# Patient Record
Sex: Female | Born: 1945
Health system: Southern US, Community
[De-identification: ages and names within clinical notes are randomized; demographics above are authoritative.]

## PROBLEM LIST (undated history)

## (undated) DIAGNOSIS — Z860100 Personal history of colon polyps, unspecified: Secondary | ICD-10-CM

## (undated) DIAGNOSIS — J45909 Unspecified asthma, uncomplicated: Secondary | ICD-10-CM

## (undated) DIAGNOSIS — Z8041 Family history of malignant neoplasm of ovary: Secondary | ICD-10-CM

## (undated) DIAGNOSIS — R0600 Dyspnea, unspecified: Secondary | ICD-10-CM

## (undated) DIAGNOSIS — Z853 Personal history of malignant neoplasm of breast: Secondary | ICD-10-CM

## (undated) DIAGNOSIS — R252 Cramp and spasm: Secondary | ICD-10-CM

## (undated) DIAGNOSIS — E785 Hyperlipidemia, unspecified: Secondary | ICD-10-CM

## (undated) DIAGNOSIS — D649 Anemia, unspecified: Secondary | ICD-10-CM

## (undated) DIAGNOSIS — Z974 Presence of external hearing-aid: Secondary | ICD-10-CM

## (undated) DIAGNOSIS — G4730 Sleep apnea, unspecified: Secondary | ICD-10-CM

## (undated) DIAGNOSIS — J449 Chronic obstructive pulmonary disease, unspecified: Secondary | ICD-10-CM

## (undated) DIAGNOSIS — K449 Diaphragmatic hernia without obstruction or gangrene: Secondary | ICD-10-CM

## (undated) DIAGNOSIS — Z973 Presence of spectacles and contact lenses: Secondary | ICD-10-CM

## (undated) DIAGNOSIS — Z87898 Personal history of other specified conditions: Secondary | ICD-10-CM

## (undated) DIAGNOSIS — Z9981 Dependence on supplemental oxygen: Secondary | ICD-10-CM

## (undated) DIAGNOSIS — R351 Nocturia: Secondary | ICD-10-CM

## (undated) DIAGNOSIS — Z8719 Personal history of other diseases of the digestive system: Secondary | ICD-10-CM

## (undated) DIAGNOSIS — I82501 Chronic embolism and thrombosis of unspecified deep veins of right lower extremity: Secondary | ICD-10-CM

## (undated) DIAGNOSIS — I739 Peripheral vascular disease, unspecified: Secondary | ICD-10-CM

## (undated) DIAGNOSIS — I509 Heart failure, unspecified: Secondary | ICD-10-CM

## (undated) DIAGNOSIS — I2510 Atherosclerotic heart disease of native coronary artery without angina pectoris: Secondary | ICD-10-CM

## (undated) DIAGNOSIS — J439 Emphysema, unspecified: Secondary | ICD-10-CM

## (undated) DIAGNOSIS — M1990 Unspecified osteoarthritis, unspecified site: Secondary | ICD-10-CM

## (undated) DIAGNOSIS — R911 Solitary pulmonary nodule: Secondary | ICD-10-CM

## (undated) DIAGNOSIS — E039 Hypothyroidism, unspecified: Secondary | ICD-10-CM

## (undated) DIAGNOSIS — I779 Disorder of arteries and arterioles, unspecified: Secondary | ICD-10-CM

## (undated) DIAGNOSIS — Z808 Family history of malignant neoplasm of other organs or systems: Secondary | ICD-10-CM

## (undated) DIAGNOSIS — I10 Essential (primary) hypertension: Secondary | ICD-10-CM

## (undated) DIAGNOSIS — Z803 Family history of malignant neoplasm of breast: Secondary | ICD-10-CM

## (undated) DIAGNOSIS — K551 Chronic vascular disorders of intestine: Secondary | ICD-10-CM

## (undated) DIAGNOSIS — R0609 Other forms of dyspnea: Secondary | ICD-10-CM

## (undated) DIAGNOSIS — Z800 Family history of malignant neoplasm of digestive organs: Secondary | ICD-10-CM

## (undated) DIAGNOSIS — K219 Gastro-esophageal reflux disease without esophagitis: Secondary | ICD-10-CM

## (undated) DIAGNOSIS — M797 Fibromyalgia: Secondary | ICD-10-CM

## (undated) DIAGNOSIS — D494 Neoplasm of unspecified behavior of bladder: Secondary | ICD-10-CM

## (undated) DIAGNOSIS — J441 Chronic obstructive pulmonary disease with (acute) exacerbation: Secondary | ICD-10-CM

## (undated) DIAGNOSIS — N189 Chronic kidney disease, unspecified: Secondary | ICD-10-CM

## (undated) DIAGNOSIS — J189 Pneumonia, unspecified organism: Secondary | ICD-10-CM

## (undated) DIAGNOSIS — I83899 Varicose veins of unspecified lower extremities with other complications: Secondary | ICD-10-CM

## (undated) HISTORY — DX: Varicose veins of unspecified lower extremity with other complications: I83.899

## (undated) HISTORY — PX: VIDEO ASSISTED THORACOSCOPY (VATS)/WEDGE RESECTION: SHX6174

## (undated) HISTORY — DX: Family history of malignant neoplasm of digestive organs: Z80.0

## (undated) HISTORY — DX: Gastro-esophageal reflux disease without esophagitis: K21.9

## (undated) HISTORY — DX: Family history of malignant neoplasm of other organs or systems: Z80.8

## (undated) HISTORY — PX: RECONSTRUCTION BREAST W/ LATISSIMUS DORSI FLAP: SUR1078

## (undated) HISTORY — DX: Personal history of colonic polyps: Z86.010

## (undated) HISTORY — DX: Personal history of colon polyps, unspecified: Z86.0100

## (undated) HISTORY — PX: MASTECTOMY: SHX3

## (undated) HISTORY — DX: Peripheral vascular disease, unspecified: I73.9

## (undated) HISTORY — DX: Personal history of malignant neoplasm of breast: Z85.3

## (undated) HISTORY — DX: Hyperlipidemia, unspecified: E78.5

## (undated) HISTORY — DX: Essential (primary) hypertension: I10

## (undated) HISTORY — DX: Hypothyroidism, unspecified: E03.9

## (undated) HISTORY — PX: BREAST LUMPECTOMY W/ NEEDLE LOCALIZATION: SHX1266

## (undated) HISTORY — PX: REDUCTION MAMMAPLASTY: SUR839

## (undated) HISTORY — PX: CARDIAC CATHETERIZATION: SHX172

## (undated) HISTORY — PX: SIMPLE MASTECTOMY: SHX2409

## (undated) HISTORY — PX: TRANSTHORACIC ECHOCARDIOGRAM: SHX275

## (undated) HISTORY — DX: Family history of malignant neoplasm of breast: Z80.3

## (undated) HISTORY — DX: Family history of malignant neoplasm of ovary: Z80.41

## (undated) HISTORY — DX: Fibromyalgia: M79.7

---

## 1971-10-16 HISTORY — PX: VAGINAL HYSTERECTOMY: SUR661

## 1993-10-15 DIAGNOSIS — M797 Fibromyalgia: Secondary | ICD-10-CM

## 1993-10-15 HISTORY — DX: Fibromyalgia: M79.7

## 1998-12-20 ENCOUNTER — Encounter: Payer: Self-pay | Admitting: Gastroenterology

## 1998-12-20 ENCOUNTER — Ambulatory Visit (HOSPITAL_COMMUNITY): Admission: RE | Admit: 1998-12-20 | Discharge: 1998-12-20 | Payer: Self-pay | Admitting: Gastroenterology

## 1999-08-08 ENCOUNTER — Encounter: Admission: RE | Admit: 1999-08-08 | Discharge: 1999-08-08 | Payer: Self-pay | Admitting: *Deleted

## 2000-10-15 HISTORY — PX: PARTIAL COLECTOMY: SHX5273

## 2003-07-21 ENCOUNTER — Other Ambulatory Visit: Admission: RE | Admit: 2003-07-21 | Discharge: 2003-07-21 | Payer: Self-pay | Admitting: *Deleted

## 2004-06-01 ENCOUNTER — Other Ambulatory Visit: Admission: RE | Admit: 2004-06-01 | Discharge: 2004-06-01 | Payer: Self-pay | Admitting: Family Medicine

## 2004-07-11 ENCOUNTER — Ambulatory Visit (HOSPITAL_COMMUNITY): Admission: RE | Admit: 2004-07-11 | Discharge: 2004-07-11 | Payer: Self-pay | Admitting: Gastroenterology

## 2004-09-12 ENCOUNTER — Ambulatory Visit: Payer: Self-pay | Admitting: Family Medicine

## 2004-10-15 LAB — HM COLONOSCOPY

## 2004-10-24 ENCOUNTER — Ambulatory Visit: Payer: Self-pay | Admitting: Family Medicine

## 2005-01-30 ENCOUNTER — Ambulatory Visit: Payer: Self-pay | Admitting: Family Medicine

## 2005-02-07 ENCOUNTER — Ambulatory Visit: Payer: Self-pay | Admitting: Family Medicine

## 2005-06-26 ENCOUNTER — Ambulatory Visit: Payer: Self-pay | Admitting: Family Medicine

## 2005-07-25 ENCOUNTER — Ambulatory Visit (HOSPITAL_COMMUNITY): Admission: RE | Admit: 2005-07-25 | Discharge: 2005-07-25 | Payer: Self-pay | Admitting: Gastroenterology

## 2005-07-27 ENCOUNTER — Inpatient Hospital Stay (HOSPITAL_COMMUNITY): Admission: EM | Admit: 2005-07-27 | Discharge: 2005-07-30 | Payer: Self-pay | Admitting: *Deleted

## 2005-07-27 ENCOUNTER — Encounter: Admission: RE | Admit: 2005-07-27 | Discharge: 2005-07-27 | Payer: Self-pay | Admitting: Gastroenterology

## 2005-07-30 ENCOUNTER — Ambulatory Visit: Payer: Self-pay | Admitting: Internal Medicine

## 2005-08-02 ENCOUNTER — Ambulatory Visit: Payer: Self-pay | Admitting: Oncology

## 2005-08-06 ENCOUNTER — Ambulatory Visit: Payer: Self-pay | Admitting: Cardiology

## 2005-08-21 ENCOUNTER — Ambulatory Visit (HOSPITAL_COMMUNITY): Admission: RE | Admit: 2005-08-21 | Discharge: 2005-08-21 | Payer: Self-pay | Admitting: Thoracic Surgery

## 2005-09-11 ENCOUNTER — Inpatient Hospital Stay (HOSPITAL_COMMUNITY)
Admission: RE | Admit: 2005-09-11 | Discharge: 2005-09-14 | Payer: Self-pay | Admitting: Thoracic Surgery (Cardiothoracic Vascular Surgery)

## 2005-09-11 ENCOUNTER — Encounter (INDEPENDENT_AMBULATORY_CARE_PROVIDER_SITE_OTHER): Payer: Self-pay | Admitting: *Deleted

## 2005-09-25 ENCOUNTER — Encounter
Admission: RE | Admit: 2005-09-25 | Discharge: 2005-09-25 | Payer: Self-pay | Admitting: Thoracic Surgery (Cardiothoracic Vascular Surgery)

## 2006-01-01 ENCOUNTER — Ambulatory Visit: Payer: Self-pay | Admitting: Family Medicine

## 2006-08-28 ENCOUNTER — Ambulatory Visit: Payer: Self-pay | Admitting: Family Medicine

## 2006-08-28 LAB — CONVERTED CEMR LAB
ALT: 15 units/L (ref 0–40)
AST: 21 units/L (ref 0–37)
Albumin: 3.8 g/dL (ref 3.5–5.2)
Alkaline Phosphatase: 58 units/L (ref 39–117)
BUN: 13 mg/dL (ref 6–23)
Basophils Absolute: 0.1 10*3/uL (ref 0.0–0.1)
Basophils Relative: 0.9 % (ref 0.0–1.0)
CO2: 29 meq/L (ref 19–32)
Calcium: 9.7 mg/dL (ref 8.4–10.5)
Chloride: 107 meq/L (ref 96–112)
Chol/HDL Ratio, serum: 2.9
Cholesterol: 215 mg/dL (ref 0–200)
Creatinine, Ser: 1 mg/dL (ref 0.4–1.2)
Eosinophil percent: 9.6 % — ABNORMAL HIGH (ref 0.0–5.0)
GFR calc non Af Amer: 60 mL/min
Glomerular Filtration Rate, Af Am: 72 mL/min/{1.73_m2}
Glucose, Bld: 95 mg/dL (ref 70–99)
HCT: 45.5 % (ref 36.0–46.0)
HDL: 73.7 mg/dL (ref >39.0)
Hemoglobin: 14.9 g/dL (ref 12.0–15.0)
LDL DIRECT: 126.5 mg/dL
Lymphocytes Relative: 28.7 % (ref 12.0–46.0)
MCHC: 32.8 g/dL (ref 30.0–36.0)
MCV: 96 fL (ref 78.0–100.0)
Monocytes Absolute: 0.7 10*3/uL (ref 0.2–0.7)
Monocytes Relative: 11.2 % — ABNORMAL HIGH (ref 3.0–11.0)
Neutro Abs: 3.1 10*3/uL (ref 1.4–7.7)
Neutrophils Relative %: 49.6 % (ref 43.0–77.0)
Platelets: 408 10*3/uL — ABNORMAL HIGH (ref 150–400)
Potassium: 5.2 meq/L — ABNORMAL HIGH (ref 3.5–5.1)
RBC: 4.74 M/uL (ref 3.87–5.11)
RDW: 13.1 % (ref 11.5–14.6)
Sodium: 143 meq/L (ref 135–145)
TSH: 3.13 microintl units/mL (ref 0.35–5.50)
Total Bilirubin: 1 mg/dL (ref 0.3–1.2)
Total Protein: 6.9 g/dL (ref 6.0–8.3)
Triglyceride fasting, serum: 70 mg/dL (ref 0–149)
VLDL: 14 mg/dL (ref 0–40)
WBC: 6.3 10*3/uL (ref 4.5–10.5)

## 2006-09-03 ENCOUNTER — Encounter: Payer: Self-pay | Admitting: Family Medicine

## 2006-09-03 ENCOUNTER — Other Ambulatory Visit: Admission: RE | Admit: 2006-09-03 | Discharge: 2006-09-03 | Payer: Self-pay | Admitting: Family Medicine

## 2006-09-03 ENCOUNTER — Ambulatory Visit: Payer: Self-pay | Admitting: Family Medicine

## 2006-09-11 ENCOUNTER — Encounter: Payer: Self-pay | Admitting: Family Medicine

## 2006-09-11 LAB — CONVERTED CEMR LAB: Pap Smear: NEGATIVE

## 2007-08-19 ENCOUNTER — Telehealth: Payer: Self-pay | Admitting: Family Medicine

## 2007-09-02 ENCOUNTER — Ambulatory Visit: Payer: Self-pay | Admitting: Family Medicine

## 2007-09-02 LAB — CONVERTED CEMR LAB
Bilirubin Urine: NEGATIVE
Glucose, Urine, Semiquant: NEGATIVE
Ketones, urine, test strip: NEGATIVE
Nitrite: NEGATIVE
Protein, U semiquant: NEGATIVE
Specific Gravity, Urine: <1.005
Urobilinogen, UA: 0.2
WBC Urine, dipstick: NEGATIVE
pH: 5.5

## 2007-09-08 ENCOUNTER — Encounter: Payer: Self-pay | Admitting: Family Medicine

## 2007-09-08 DIAGNOSIS — Z8719 Personal history of other diseases of the digestive system: Secondary | ICD-10-CM | POA: Insufficient documentation

## 2007-09-08 DIAGNOSIS — Z860100 Personal history of colon polyps, unspecified: Secondary | ICD-10-CM | POA: Insufficient documentation

## 2007-09-08 DIAGNOSIS — K219 Gastro-esophageal reflux disease without esophagitis: Secondary | ICD-10-CM | POA: Insufficient documentation

## 2007-09-08 DIAGNOSIS — J441 Chronic obstructive pulmonary disease with (acute) exacerbation: Secondary | ICD-10-CM | POA: Insufficient documentation

## 2007-09-08 DIAGNOSIS — I10 Essential (primary) hypertension: Secondary | ICD-10-CM | POA: Insufficient documentation

## 2007-09-09 ENCOUNTER — Other Ambulatory Visit: Admission: RE | Admit: 2007-09-09 | Discharge: 2007-09-09 | Payer: Self-pay | Admitting: Family Medicine

## 2007-09-09 ENCOUNTER — Ambulatory Visit: Payer: Self-pay | Admitting: Family Medicine

## 2007-09-09 ENCOUNTER — Encounter: Payer: Self-pay | Admitting: Family Medicine

## 2007-09-09 LAB — CONVERTED CEMR LAB: Pap Smear: NORMAL

## 2007-10-01 ENCOUNTER — Encounter: Payer: Self-pay | Admitting: Family Medicine

## 2007-10-06 LAB — CONVERTED CEMR LAB
ALT: 16 units/L (ref 0–35)
AST: 20 units/L (ref 0–37)
Albumin: 4.2 g/dL (ref 3.5–5.2)
Alkaline Phosphatase: 56 units/L (ref 39–117)
BUN: 15 mg/dL (ref 6–23)
Basophils Absolute: 0 10*3/uL (ref 0.0–0.1)
Basophils Relative: 0 % (ref 0.0–1.0)
Bilirubin, Direct: 0.1 mg/dL (ref 0.0–0.3)
CO2: 26 meq/L (ref 19–32)
Calcium: 9.9 mg/dL (ref 8.4–10.5)
Chloride: 101 meq/L (ref 96–112)
Cholesterol: 222 mg/dL (ref 0–200)
Creatinine, Ser: 0.8 mg/dL (ref 0.4–1.2)
Direct LDL: 108 mg/dL
Eosinophils Absolute: 0.5 10*3/uL (ref 0.0–0.6)
Eosinophils Relative: 7.1 % — ABNORMAL HIGH (ref 0.0–5.0)
GFR calc Af Amer: 93 mL/min
GFR calc non Af Amer: 77 mL/min
Glucose, Bld: 87 mg/dL (ref 70–99)
HCT: 45 % (ref 36.0–46.0)
HDL: 103.8 mg/dL (ref >39.0)
Hemoglobin: 15.5 g/dL — ABNORMAL HIGH (ref 12.0–15.0)
Lymphocytes Relative: 30.9 % (ref 12.0–46.0)
MCHC: 34.4 g/dL (ref 30.0–36.0)
MCV: 94.7 fL (ref 78.0–100.0)
Monocytes Absolute: 0.9 10*3/uL — ABNORMAL HIGH (ref 0.2–0.7)
Monocytes Relative: 12 % — ABNORMAL HIGH (ref 3.0–11.0)
Neutro Abs: 3.9 10*3/uL (ref 1.4–7.7)
Neutrophils Relative %: 50 % (ref 43.0–77.0)
Platelets: 403 10*3/uL — ABNORMAL HIGH (ref 150–400)
Potassium: 5.5 meq/L — ABNORMAL HIGH (ref 3.5–5.1)
RBC: 4.75 M/uL (ref 3.87–5.11)
RDW: 12.6 % (ref 11.5–14.6)
Sodium: 136 meq/L (ref 135–145)
TSH: 4.4 microintl units/mL (ref 0.35–5.50)
Total Bilirubin: 0.7 mg/dL (ref 0.3–1.2)
Total CHOL/HDL Ratio: 2.1
Total Protein: 7.2 g/dL (ref 6.0–8.3)
Triglycerides: 56 mg/dL (ref 0–149)
VLDL: 11 mg/dL (ref 0–40)
WBC: 7.7 10*3/uL (ref 4.5–10.5)

## 2007-11-18 ENCOUNTER — Ambulatory Visit: Payer: Self-pay | Admitting: Family Medicine

## 2007-11-18 DIAGNOSIS — IMO0001 Reserved for inherently not codable concepts without codable children: Secondary | ICD-10-CM | POA: Insufficient documentation

## 2008-05-24 ENCOUNTER — Telehealth: Payer: Self-pay | Admitting: Family Medicine

## 2008-05-25 ENCOUNTER — Ambulatory Visit: Payer: Self-pay | Admitting: Family Medicine

## 2008-06-23 ENCOUNTER — Ambulatory Visit: Payer: Self-pay | Admitting: Family Medicine

## 2008-09-07 ENCOUNTER — Ambulatory Visit: Payer: Self-pay | Admitting: Family Medicine

## 2008-09-07 LAB — CONVERTED CEMR LAB
Bilirubin Urine: NEGATIVE
Blood in Urine, dipstick: NEGATIVE
Glucose, Urine, Semiquant: NEGATIVE
Ketones, urine, test strip: NEGATIVE
Nitrite: NEGATIVE
Protein, U semiquant: NEGATIVE
Specific Gravity, Urine: 1.01
Urobilinogen, UA: 0.2
WBC Urine, dipstick: NEGATIVE
pH: 5.5

## 2008-09-14 ENCOUNTER — Ambulatory Visit: Payer: Self-pay | Admitting: Family Medicine

## 2008-09-14 DIAGNOSIS — H9190 Unspecified hearing loss, unspecified ear: Secondary | ICD-10-CM | POA: Insufficient documentation

## 2008-09-14 LAB — CONVERTED CEMR LAB
ALT: 20 units/L (ref 0–35)
AST: 24 units/L (ref 0–37)
Albumin: 3.9 g/dL (ref 3.5–5.2)
Alkaline Phosphatase: 51 units/L (ref 39–117)
BUN: 13 mg/dL (ref 6–23)
Basophils Absolute: 0.1 10*3/uL (ref 0.0–0.1)
Basophils Relative: 1.1 % (ref 0.0–3.0)
Bilirubin, Direct: 0.1 mg/dL (ref 0.0–0.3)
CO2: 28 meq/L (ref 19–32)
Calcium: 9.3 mg/dL (ref 8.4–10.5)
Chloride: 104 meq/L (ref 96–112)
Cholesterol: 225 mg/dL (ref 0–200)
Creatinine, Ser: 0.9 mg/dL (ref 0.4–1.2)
Direct LDL: 101.4 mg/dL
Eosinophils Absolute: 0.4 10*3/uL (ref 0.0–0.7)
Eosinophils Relative: 6.7 % — ABNORMAL HIGH (ref 0.0–5.0)
GFR calc Af Amer: 81 mL/min
GFR calc non Af Amer: 67 mL/min
Glucose, Bld: 75 mg/dL (ref 70–99)
HCT: 41.9 % (ref 36.0–46.0)
HDL: 89.5 mg/dL (ref >39.0)
Hemoglobin: 14.7 g/dL (ref 12.0–15.0)
Lymphocytes Relative: 31.6 % (ref 12.0–46.0)
MCHC: 35.2 g/dL (ref 30.0–36.0)
MCV: 95.4 fL (ref 78.0–100.0)
Monocytes Absolute: 0.7 10*3/uL (ref 0.1–1.0)
Monocytes Relative: 10.9 % (ref 3.0–12.0)
Neutro Abs: 3.1 10*3/uL (ref 1.4–7.7)
Neutrophils Relative %: 49.7 % (ref 43.0–77.0)
Platelets: 370 10*3/uL (ref 150–400)
Potassium: 4.7 meq/L (ref 3.5–5.1)
RBC: 4.39 M/uL (ref 3.87–5.11)
RDW: 13.2 % (ref 11.5–14.6)
Sodium: 139 meq/L (ref 135–145)
TSH: 5.13 microintl units/mL (ref 0.35–5.50)
Total Bilirubin: 0.6 mg/dL (ref 0.3–1.2)
Total CHOL/HDL Ratio: 2.5
Total Protein: 7.2 g/dL (ref 6.0–8.3)
Triglycerides: 98 mg/dL (ref 0–149)
VLDL: 20 mg/dL (ref 0–40)
WBC: 6.3 10*3/uL (ref 4.5–10.5)

## 2008-10-13 ENCOUNTER — Encounter: Payer: Self-pay | Admitting: Family Medicine

## 2008-10-15 DIAGNOSIS — C801 Malignant (primary) neoplasm, unspecified: Secondary | ICD-10-CM

## 2008-10-15 HISTORY — PX: BREAST EXCISIONAL BIOPSY: SUR124

## 2008-10-15 HISTORY — DX: Malignant (primary) neoplasm, unspecified: C80.1

## 2008-10-18 ENCOUNTER — Encounter: Payer: Self-pay | Admitting: Family Medicine

## 2008-10-19 ENCOUNTER — Encounter: Payer: Self-pay | Admitting: Family Medicine

## 2008-10-21 ENCOUNTER — Telehealth: Payer: Self-pay | Admitting: Family Medicine

## 2008-10-25 ENCOUNTER — Encounter: Admission: RE | Admit: 2008-10-25 | Discharge: 2008-10-25 | Payer: Self-pay | Admitting: Radiology

## 2008-10-29 ENCOUNTER — Encounter: Payer: Self-pay | Admitting: Family Medicine

## 2008-11-08 ENCOUNTER — Encounter: Admission: RE | Admit: 2008-11-08 | Discharge: 2008-11-08 | Payer: Self-pay | Admitting: Surgery

## 2008-11-09 ENCOUNTER — Encounter (INDEPENDENT_AMBULATORY_CARE_PROVIDER_SITE_OTHER): Payer: Self-pay | Admitting: Surgery

## 2008-11-09 ENCOUNTER — Ambulatory Visit (HOSPITAL_BASED_OUTPATIENT_CLINIC_OR_DEPARTMENT_OTHER): Admission: RE | Admit: 2008-11-09 | Discharge: 2008-11-09 | Payer: Self-pay | Admitting: Surgery

## 2008-11-10 ENCOUNTER — Ambulatory Visit: Payer: Self-pay | Admitting: Oncology

## 2008-11-24 ENCOUNTER — Encounter: Payer: Self-pay | Admitting: Family Medicine

## 2008-12-20 ENCOUNTER — Ambulatory Visit: Payer: Self-pay | Admitting: Oncology

## 2008-12-22 ENCOUNTER — Encounter: Payer: Self-pay | Admitting: Family Medicine

## 2008-12-23 ENCOUNTER — Encounter: Admission: RE | Admit: 2008-12-23 | Discharge: 2008-12-23 | Payer: Self-pay | Admitting: Oncology

## 2008-12-23 ENCOUNTER — Ambulatory Visit (HOSPITAL_COMMUNITY): Admission: RE | Admit: 2008-12-23 | Discharge: 2008-12-23 | Payer: Self-pay | Admitting: Oncology

## 2008-12-25 ENCOUNTER — Ambulatory Visit (HOSPITAL_COMMUNITY): Admission: RE | Admit: 2008-12-25 | Discharge: 2008-12-25 | Payer: Self-pay | Admitting: Oncology

## 2008-12-28 ENCOUNTER — Encounter (INDEPENDENT_AMBULATORY_CARE_PROVIDER_SITE_OTHER): Payer: Self-pay | Admitting: Surgery

## 2008-12-28 ENCOUNTER — Ambulatory Visit (HOSPITAL_COMMUNITY): Admission: RE | Admit: 2008-12-28 | Discharge: 2008-12-29 | Payer: Self-pay | Admitting: Surgery

## 2008-12-29 ENCOUNTER — Telehealth: Payer: Self-pay | Admitting: Family Medicine

## 2009-01-03 ENCOUNTER — Encounter: Payer: Self-pay | Admitting: Family Medicine

## 2009-01-21 ENCOUNTER — Encounter: Payer: Self-pay | Admitting: Family Medicine

## 2009-01-21 LAB — CBC WITH DIFFERENTIAL/PLATELET
BASO%: 0.4 % (ref 0.0–2.0)
Basophils Absolute: 0 10*3/uL (ref 0.0–0.1)
EOS%: 7.4 % — ABNORMAL HIGH (ref 0.0–7.0)
Eosinophils Absolute: 0.4 10*3/uL (ref 0.0–0.5)
HCT: 41.1 % (ref 34.8–46.6)
HGB: 14 g/dL (ref 11.6–15.9)
LYMPH%: 26.1 % (ref 14.0–49.7)
MCH: 32 pg (ref 25.1–34.0)
MCHC: 34 g/dL (ref 31.5–36.0)
MCV: 94 fL (ref 79.5–101.0)
MONO#: 0.7 10*3/uL (ref 0.1–0.9)
MONO%: 11.4 % (ref 0.0–14.0)
NEUT#: 3.2 10*3/uL (ref 1.5–6.5)
NEUT%: 54.7 % (ref 38.4–76.8)
Platelets: 363 10*3/uL (ref 145–400)
RBC: 4.37 10*6/uL (ref 3.70–5.45)
RDW: 13.7 % (ref 11.2–14.5)
WBC: 5.9 10*3/uL (ref 3.9–10.3)
lymph#: 1.5 10*3/uL (ref 0.9–3.3)

## 2009-01-31 ENCOUNTER — Encounter: Payer: Self-pay | Admitting: Family Medicine

## 2009-02-03 ENCOUNTER — Ambulatory Visit: Payer: Self-pay | Admitting: Oncology

## 2009-02-07 ENCOUNTER — Encounter: Payer: Self-pay | Admitting: Family Medicine

## 2009-02-07 LAB — CBC WITH DIFFERENTIAL/PLATELET
BASO%: 1.2 % (ref 0.0–2.0)
Basophils Absolute: 0.1 10e3/uL (ref 0.0–0.1)
EOS%: 4.1 % (ref 0.0–7.0)
Eosinophils Absolute: 0.3 10e3/uL (ref 0.0–0.5)
HCT: 42.7 % (ref 34.8–46.6)
HGB: 14.8 g/dL (ref 11.6–15.9)
LYMPH%: 23.1 % (ref 14.0–49.7)
MCH: 32 pg (ref 25.1–34.0)
MCHC: 34.7 g/dL (ref 31.5–36.0)
MCV: 92.3 fL (ref 79.5–101.0)
MONO#: 1 10e3/uL — ABNORMAL HIGH (ref 0.1–0.9)
MONO%: 12.6 % (ref 0.0–14.0)
NEUT#: 4.7 10e3/uL (ref 1.5–6.5)
NEUT%: 59 % (ref 38.4–76.8)
Platelets: 514 10e3/uL — ABNORMAL HIGH (ref 145–400)
RBC: 4.63 10e6/uL (ref 3.70–5.45)
RDW: 13.6 % (ref 11.2–14.5)
WBC: 7.9 10e3/uL (ref 3.9–10.3)
lymph#: 1.8 10e3/uL (ref 0.9–3.3)

## 2009-02-15 ENCOUNTER — Encounter: Payer: Self-pay | Admitting: Family Medicine

## 2009-02-22 ENCOUNTER — Encounter: Payer: Self-pay | Admitting: Family Medicine

## 2009-02-22 LAB — CBC WITH DIFFERENTIAL/PLATELET
BASO%: 0.2 % (ref 0.0–2.0)
Basophils Absolute: 0.1 10*3/uL (ref 0.0–0.1)
EOS%: 0.3 % (ref 0.0–7.0)
Eosinophils Absolute: 0.1 10*3/uL (ref 0.0–0.5)
HCT: 38.7 % (ref 34.8–46.6)
HGB: 13 g/dL (ref 11.6–15.9)
LYMPH%: 4.9 % — ABNORMAL LOW (ref 14.0–49.7)
MCH: 31.4 pg (ref 25.1–34.0)
MCHC: 33.7 g/dL (ref 31.5–36.0)
MCV: 93.1 fL (ref 79.5–101.0)
MONO#: 0.3 10*3/uL (ref 0.1–0.9)
MONO%: 0.9 % (ref 0.0–14.0)
NEUT#: 34.1 10*3/uL — ABNORMAL HIGH (ref 1.5–6.5)
NEUT%: 93.7 % — ABNORMAL HIGH (ref 38.4–76.8)
Platelets: 341 10*3/uL (ref 145–400)
RBC: 4.15 10*6/uL (ref 3.70–5.45)
RDW: 13.1 % (ref 11.2–14.5)
WBC: 36.4 10*3/uL — ABNORMAL HIGH (ref 3.9–10.3)
lymph#: 1.8 10*3/uL (ref 0.9–3.3)

## 2009-03-02 ENCOUNTER — Telehealth: Payer: Self-pay | Admitting: Family Medicine

## 2009-03-08 ENCOUNTER — Encounter: Payer: Self-pay | Admitting: Family Medicine

## 2009-03-08 LAB — CBC WITH DIFFERENTIAL/PLATELET
BASO%: 0.4 % (ref 0.0–2.0)
Basophils Absolute: 0 10*3/uL (ref 0.0–0.1)
EOS%: 0 % (ref 0.0–7.0)
Eosinophils Absolute: 0 10*3/uL (ref 0.0–0.5)
HCT: 37 % (ref 34.8–46.6)
HGB: 12.7 g/dL (ref 11.6–15.9)
LYMPH%: 14.6 % (ref 14.0–49.7)
MCH: 30.4 pg (ref 25.1–34.0)
MCHC: 34.3 g/dL (ref 31.5–36.0)
MCV: 88.5 fL (ref 79.5–101.0)
MONO#: 0 10*3/uL — ABNORMAL LOW (ref 0.1–0.9)
MONO%: 0.4 % (ref 0.0–14.0)
NEUT#: 3.9 10*3/uL (ref 1.5–6.5)
NEUT%: 84.6 % — ABNORMAL HIGH (ref 38.4–76.8)
Platelets: 809 10*3/uL — ABNORMAL HIGH (ref 145–400)
RBC: 4.18 10*6/uL (ref 3.70–5.45)
RDW: 13.3 % (ref 11.2–14.5)
WBC: 4.6 10*3/uL (ref 3.9–10.3)
lymph#: 0.7 10*3/uL — ABNORMAL LOW (ref 0.9–3.3)

## 2009-03-08 LAB — URINALYSIS, MICROSCOPIC - CHCC
Bilirubin (Urine): NEGATIVE
Blood: NEGATIVE
Glucose: NEGATIVE g/dL
Ketones: NEGATIVE mg/dL
Leukocyte Esterase: NEGATIVE
Nitrite: NEGATIVE
Protein: NEGATIVE mg/dL
Specific Gravity, Urine: 1.03 (ref 1.003–1.035)
WBC, UA: NEGATIVE (ref 0–2)
pH: 5 (ref 4.6–8.0)

## 2009-03-09 LAB — URINE CULTURE

## 2009-03-15 ENCOUNTER — Encounter: Payer: Self-pay | Admitting: Family Medicine

## 2009-03-15 LAB — CBC WITH DIFFERENTIAL/PLATELET
BASO%: 0.6 % (ref 0.0–2.0)
Basophils Absolute: 0 10*3/uL (ref 0.0–0.1)
EOS%: 7.7 % — ABNORMAL HIGH (ref 0.0–7.0)
Eosinophils Absolute: 0.6 10*3/uL — ABNORMAL HIGH (ref 0.0–0.5)
HCT: 39.7 % (ref 34.8–46.6)
HGB: 13.6 g/dL (ref 11.6–15.9)
LYMPH%: 23.6 % (ref 14.0–49.7)
MCH: 31.4 pg (ref 25.1–34.0)
MCHC: 34.2 g/dL (ref 31.5–36.0)
MCV: 91.7 fL (ref 79.5–101.0)
MONO#: 0.9 10*3/uL (ref 0.1–0.9)
MONO%: 12.3 % (ref 0.0–14.0)
NEUT#: 4.2 10*3/uL (ref 1.5–6.5)
NEUT%: 55.8 % (ref 38.4–76.8)
Platelets: 660 10*3/uL — ABNORMAL HIGH (ref 145–400)
RBC: 4.33 10*6/uL (ref 3.70–5.45)
RDW: 13.9 % (ref 11.2–14.5)
WBC: 7.5 10*3/uL (ref 3.9–10.3)
lymph#: 1.8 10*3/uL (ref 0.9–3.3)

## 2009-03-15 LAB — COMPREHENSIVE METABOLIC PANEL
ALT: 19 U/L (ref 0–35)
AST: 17 U/L (ref 0–37)
Albumin: 4.1 g/dL (ref 3.5–5.2)
Alkaline Phosphatase: 86 U/L (ref 39–117)
BUN: 13 mg/dL (ref 6–23)
CO2: 23 mEq/L (ref 19–32)
Calcium: 9.7 mg/dL (ref 8.4–10.5)
Chloride: 103 mEq/L (ref 96–112)
Creatinine, Ser: 0.87 mg/dL (ref 0.40–1.20)
Glucose, Bld: 92 mg/dL (ref 70–99)
Potassium: 5.4 mEq/L — ABNORMAL HIGH (ref 3.5–5.3)
Sodium: 138 mEq/L (ref 135–145)
Total Bilirubin: 0.4 mg/dL (ref 0.3–1.2)
Total Protein: 7.3 g/dL (ref 6.0–8.3)

## 2009-03-18 ENCOUNTER — Ambulatory Visit: Payer: Self-pay | Admitting: Oncology

## 2009-03-24 ENCOUNTER — Encounter: Payer: Self-pay | Admitting: Family Medicine

## 2009-03-24 LAB — CBC WITH DIFFERENTIAL/PLATELET
BASO%: 0.1 % (ref 0.0–2.0)
Basophils Absolute: 0 10*3/uL (ref 0.0–0.1)
EOS%: 1.4 % (ref 0.0–7.0)
Eosinophils Absolute: 0.2 10*3/uL (ref 0.0–0.5)
HCT: 36.8 % (ref 34.8–46.6)
HGB: 12.9 g/dL (ref 11.6–15.9)
LYMPH%: 9.3 % — ABNORMAL LOW (ref 14.0–49.7)
MCH: 31.9 pg (ref 25.1–34.0)
MCHC: 35 g/dL (ref 31.5–36.0)
MCV: 91.1 fL (ref 79.5–101.0)
MONO#: 0.7 10*3/uL (ref 0.1–0.9)
MONO%: 4.2 % (ref 0.0–14.0)
NEUT#: 13.7 10*3/uL — ABNORMAL HIGH (ref 1.5–6.5)
NEUT%: 85 % — ABNORMAL HIGH (ref 38.4–76.8)
Platelets: 322 10*3/uL (ref 145–400)
RBC: 4.04 10*6/uL (ref 3.70–5.45)
RDW: 13.9 % (ref 11.2–14.5)
WBC: 16.1 10*3/uL — ABNORMAL HIGH (ref 3.9–10.3)
lymph#: 1.5 10*3/uL (ref 0.9–3.3)

## 2009-04-11 ENCOUNTER — Encounter: Payer: Self-pay | Admitting: Family Medicine

## 2009-04-11 LAB — CBC WITH DIFFERENTIAL/PLATELET
BASO%: 0.2 % (ref 0.0–2.0)
Basophils Absolute: 0 10*3/uL (ref 0.0–0.1)
EOS%: 0 % (ref 0.0–7.0)
Eosinophils Absolute: 0 10*3/uL (ref 0.0–0.5)
HCT: 36.4 % (ref 34.8–46.6)
HGB: 12.4 g/dL (ref 11.6–15.9)
LYMPH%: 12 % — ABNORMAL LOW (ref 14.0–49.7)
MCH: 30 pg (ref 25.1–34.0)
MCHC: 34.1 g/dL (ref 31.5–36.0)
MCV: 87.9 fL (ref 79.5–101.0)
MONO#: 0 10*3/uL — ABNORMAL LOW (ref 0.1–0.9)
MONO%: 0.4 % (ref 0.0–14.0)
NEUT#: 4.4 10*3/uL (ref 1.5–6.5)
NEUT%: 87.4 % — ABNORMAL HIGH (ref 38.4–76.8)
Platelets: 667 10*3/uL — ABNORMAL HIGH (ref 145–400)
RBC: 4.14 10*6/uL (ref 3.70–5.45)
RDW: 14.6 % — ABNORMAL HIGH (ref 11.2–14.5)
WBC: 5 10*3/uL (ref 3.9–10.3)
lymph#: 0.6 10*3/uL — ABNORMAL LOW (ref 0.9–3.3)

## 2009-04-11 LAB — COMPREHENSIVE METABOLIC PANEL
ALT: 15 U/L (ref 0–35)
AST: 14 U/L (ref 0–37)
Albumin: 4.4 g/dL (ref 3.5–5.2)
Alkaline Phosphatase: 88 U/L (ref 39–117)
BUN: 15 mg/dL (ref 6–23)
CO2: 20 mEq/L (ref 19–32)
Calcium: 10.3 mg/dL (ref 8.4–10.5)
Chloride: 105 mEq/L (ref 96–112)
Creatinine, Ser: 0.73 mg/dL (ref 0.40–1.20)
Glucose, Bld: 107 mg/dL — ABNORMAL HIGH (ref 70–99)
Potassium: 5.2 mEq/L (ref 3.5–5.3)
Sodium: 140 mEq/L (ref 135–145)
Total Bilirubin: 0.4 mg/dL (ref 0.3–1.2)
Total Protein: 7.7 g/dL (ref 6.0–8.3)

## 2009-04-19 ENCOUNTER — Ambulatory Visit: Payer: Self-pay | Admitting: Oncology

## 2009-04-19 ENCOUNTER — Encounter: Payer: Self-pay | Admitting: Family Medicine

## 2009-04-19 LAB — CBC WITH DIFFERENTIAL/PLATELET
BASO%: 0 % (ref 0.0–2.0)
Basophils Absolute: 0 10*3/uL (ref 0.0–0.1)
EOS%: 0.6 % (ref 0.0–7.0)
Eosinophils Absolute: 0.2 10*3/uL (ref 0.0–0.5)
HCT: 34.8 % (ref 34.8–46.6)
HGB: 12.1 g/dL (ref 11.6–15.9)
LYMPH%: 5.1 % — ABNORMAL LOW (ref 14.0–49.7)
MCH: 31.4 pg (ref 25.1–34.0)
MCHC: 34.9 g/dL (ref 31.5–36.0)
MCV: 90 fL (ref 79.5–101.0)
MONO#: 1.4 10*3/uL — ABNORMAL HIGH (ref 0.1–0.9)
MONO%: 4.3 % (ref 0.0–14.0)
NEUT#: 28.9 10*3/uL — ABNORMAL HIGH (ref 1.5–6.5)
NEUT%: 90 % — ABNORMAL HIGH (ref 38.4–76.8)
Platelets: 322 10*3/uL (ref 145–400)
RBC: 3.87 10*6/uL (ref 3.70–5.45)
RDW: 15.3 % — ABNORMAL HIGH (ref 11.2–14.5)
WBC: 32.1 10*3/uL — ABNORMAL HIGH (ref 3.9–10.3)
lymph#: 1.6 10*3/uL (ref 0.9–3.3)

## 2009-05-02 ENCOUNTER — Encounter: Payer: Self-pay | Admitting: Family Medicine

## 2009-05-02 LAB — CBC WITH DIFFERENTIAL/PLATELET
BASO%: 0.2 % (ref 0.0–2.0)
Basophils Absolute: 0 10*3/uL (ref 0.0–0.1)
EOS%: 0 % (ref 0.0–7.0)
Eosinophils Absolute: 0 10*3/uL (ref 0.0–0.5)
HCT: 32.7 % — ABNORMAL LOW (ref 34.8–46.6)
HGB: 10.9 g/dL — ABNORMAL LOW (ref 11.6–15.9)
LYMPH%: 11.6 % — ABNORMAL LOW (ref 14.0–49.7)
MCH: 29.9 pg (ref 25.1–34.0)
MCHC: 33.3 g/dL (ref 31.5–36.0)
MCV: 89.6 fL (ref 79.5–101.0)
MONO#: 0.3 10*3/uL (ref 0.1–0.9)
MONO%: 5.6 % (ref 0.0–14.0)
NEUT#: 4.6 10*3/uL (ref 1.5–6.5)
NEUT%: 82.6 % — ABNORMAL HIGH (ref 38.4–76.8)
Platelets: 543 10*3/uL — ABNORMAL HIGH (ref 145–400)
RBC: 3.65 10*6/uL — ABNORMAL LOW (ref 3.70–5.45)
RDW: 16.8 % — ABNORMAL HIGH (ref 11.2–14.5)
WBC: 5.5 10*3/uL (ref 3.9–10.3)
lymph#: 0.6 10*3/uL — ABNORMAL LOW (ref 0.9–3.3)

## 2009-05-02 LAB — COMPREHENSIVE METABOLIC PANEL
ALT: 13 U/L (ref 0–35)
AST: 13 U/L (ref 0–37)
Albumin: 4.2 g/dL (ref 3.5–5.2)
Alkaline Phosphatase: 74 U/L (ref 39–117)
BUN: 23 mg/dL (ref 6–23)
CO2: 19 mEq/L (ref 19–32)
Calcium: 10 mg/dL (ref 8.4–10.5)
Chloride: 107 mEq/L (ref 96–112)
Creatinine, Ser: 0.8 mg/dL (ref 0.40–1.20)
Glucose, Bld: 131 mg/dL — ABNORMAL HIGH (ref 70–99)
Potassium: 5.3 mEq/L (ref 3.5–5.3)
Sodium: 138 mEq/L (ref 135–145)
Total Bilirubin: 0.4 mg/dL (ref 0.3–1.2)
Total Protein: 7 g/dL (ref 6.0–8.3)

## 2009-05-10 LAB — CBC WITH DIFFERENTIAL/PLATELET
BASO%: 0.1 % (ref 0.0–2.0)
Basophils Absolute: 0 10*3/uL (ref 0.0–0.1)
EOS%: 0.1 % (ref 0.0–7.0)
Eosinophils Absolute: 0 10*3/uL (ref 0.0–0.5)
HCT: 33.1 % — ABNORMAL LOW (ref 34.8–46.6)
HGB: 10.9 g/dL — ABNORMAL LOW (ref 11.6–15.9)
LYMPH%: 8.7 % — ABNORMAL LOW (ref 14.0–49.7)
MCH: 30.8 pg (ref 25.1–34.0)
MCHC: 32.8 g/dL (ref 31.5–36.0)
MCV: 93.8 fL (ref 79.5–101.0)
MONO#: 1 10*3/uL — ABNORMAL HIGH (ref 0.1–0.9)
MONO%: 4.9 % (ref 0.0–14.0)
NEUT#: 16.9 10*3/uL — ABNORMAL HIGH (ref 1.5–6.5)
NEUT%: 86.2 % — ABNORMAL HIGH (ref 38.4–76.8)
Platelets: 334 10*3/uL (ref 145–400)
RBC: 3.53 10*6/uL — ABNORMAL LOW (ref 3.70–5.45)
RDW: 17.2 % — ABNORMAL HIGH (ref 11.2–14.5)
WBC: 19.6 10*3/uL — ABNORMAL HIGH (ref 3.9–10.3)
lymph#: 1.7 10*3/uL (ref 0.9–3.3)

## 2009-05-17 ENCOUNTER — Ambulatory Visit: Admission: RE | Admit: 2009-05-17 | Discharge: 2009-06-06 | Payer: Self-pay | Admitting: Radiation Oncology

## 2009-05-18 ENCOUNTER — Encounter: Payer: Self-pay | Admitting: Family Medicine

## 2009-05-23 ENCOUNTER — Encounter: Admission: RE | Admit: 2009-05-23 | Discharge: 2009-05-23 | Payer: Self-pay | Admitting: Plastic Surgery

## 2009-05-30 ENCOUNTER — Inpatient Hospital Stay (HOSPITAL_COMMUNITY): Admission: RE | Admit: 2009-05-30 | Discharge: 2009-06-02 | Payer: Self-pay | Admitting: Plastic Surgery

## 2009-05-30 ENCOUNTER — Encounter (INDEPENDENT_AMBULATORY_CARE_PROVIDER_SITE_OTHER): Payer: Self-pay | Admitting: Plastic Surgery

## 2009-06-03 ENCOUNTER — Ambulatory Visit: Payer: Self-pay | Admitting: Oncology

## 2009-06-07 ENCOUNTER — Encounter: Payer: Self-pay | Admitting: Family Medicine

## 2009-06-07 LAB — CBC WITH DIFFERENTIAL/PLATELET
BASO%: 0.5 % (ref 0.0–2.0)
Basophils Absolute: 0 10*3/uL (ref 0.0–0.1)
EOS%: 6.4 % (ref 0.0–7.0)
Eosinophils Absolute: 0.4 10*3/uL (ref 0.0–0.5)
HCT: 34.4 % — ABNORMAL LOW (ref 34.8–46.6)
HGB: 11.3 g/dL — ABNORMAL LOW (ref 11.6–15.9)
LYMPH%: 20.2 % (ref 14.0–49.7)
MCH: 30.9 pg (ref 25.1–34.0)
MCHC: 33 g/dL (ref 31.5–36.0)
MCV: 93.7 fL (ref 79.5–101.0)
MONO#: 0.6 10*3/uL (ref 0.1–0.9)
MONO%: 11.7 % (ref 0.0–14.0)
NEUT#: 3.4 10*3/uL (ref 1.5–6.5)
NEUT%: 61.2 % (ref 38.4–76.8)
Platelets: 495 10*3/uL — ABNORMAL HIGH (ref 145–400)
RBC: 3.67 10*6/uL — ABNORMAL LOW (ref 3.70–5.45)
RDW: 15.9 % — ABNORMAL HIGH (ref 11.2–14.5)
WBC: 5.5 10*3/uL (ref 3.9–10.3)
lymph#: 1.1 10*3/uL (ref 0.9–3.3)

## 2009-06-07 LAB — COMPREHENSIVE METABOLIC PANEL
ALT: 17 U/L (ref 0–35)
AST: 18 U/L (ref 0–37)
Albumin: 3.9 g/dL (ref 3.5–5.2)
Alkaline Phosphatase: 72 U/L (ref 39–117)
BUN: 15 mg/dL (ref 6–23)
CO2: 22 mEq/L (ref 19–32)
Calcium: 9.5 mg/dL (ref 8.4–10.5)
Chloride: 106 mEq/L (ref 96–112)
Creatinine, Ser: 0.74 mg/dL (ref 0.40–1.20)
Glucose, Bld: 89 mg/dL (ref 70–99)
Potassium: 4.6 mEq/L (ref 3.5–5.3)
Sodium: 140 mEq/L (ref 135–145)
Total Bilirubin: 0.3 mg/dL (ref 0.3–1.2)
Total Protein: 6.6 g/dL (ref 6.0–8.3)

## 2009-06-10 ENCOUNTER — Encounter: Payer: Self-pay | Admitting: Family Medicine

## 2009-07-21 ENCOUNTER — Ambulatory Visit: Payer: Self-pay | Admitting: Oncology

## 2009-07-25 ENCOUNTER — Encounter: Payer: Self-pay | Admitting: Family Medicine

## 2009-07-25 LAB — CBC WITH DIFFERENTIAL/PLATELET
BASO%: 0.4 % (ref 0.0–2.0)
Basophils Absolute: 0 10*3/uL (ref 0.0–0.1)
EOS%: 5.2 % (ref 0.0–7.0)
Eosinophils Absolute: 0.3 10*3/uL (ref 0.0–0.5)
HCT: 40.2 % (ref 34.8–46.6)
HGB: 13.6 g/dL (ref 11.6–15.9)
LYMPH%: 28 % (ref 14.0–49.7)
MCH: 30.1 pg (ref 25.1–34.0)
MCHC: 34 g/dL (ref 31.5–36.0)
MCV: 88.7 fL (ref 79.5–101.0)
MONO#: 0.5 10*3/uL (ref 0.1–0.9)
MONO%: 10.9 % (ref 0.0–14.0)
NEUT#: 2.7 10*3/uL (ref 1.5–6.5)
NEUT%: 55.5 % (ref 38.4–76.8)
Platelets: 443 10*3/uL — ABNORMAL HIGH (ref 145–400)
RBC: 4.53 10*6/uL (ref 3.70–5.45)
RDW: 15.2 % — ABNORMAL HIGH (ref 11.2–14.5)
WBC: 4.9 10*3/uL (ref 3.9–10.3)
lymph#: 1.4 10*3/uL (ref 0.9–3.3)

## 2009-07-25 LAB — COMPREHENSIVE METABOLIC PANEL
ALT: 20 U/L (ref 0–35)
AST: 20 U/L (ref 0–37)
Albumin: 4.5 g/dL (ref 3.5–5.2)
Alkaline Phosphatase: 74 U/L (ref 39–117)
BUN: 16 mg/dL (ref 6–23)
CO2: 23 mEq/L (ref 19–32)
Calcium: 9.8 mg/dL (ref 8.4–10.5)
Chloride: 104 mEq/L (ref 96–112)
Creatinine, Ser: 0.92 mg/dL (ref 0.40–1.20)
Glucose, Bld: 92 mg/dL (ref 70–99)
Potassium: 4.6 mEq/L (ref 3.5–5.3)
Sodium: 139 mEq/L (ref 135–145)
Total Bilirubin: 0.3 mg/dL (ref 0.3–1.2)
Total Protein: 7.2 g/dL (ref 6.0–8.3)

## 2009-07-25 LAB — LACTATE DEHYDROGENASE: LDH: 150 U/L (ref 94–250)

## 2009-07-25 LAB — CANCER ANTIGEN 27.29: CA 27.29: 26 U/mL (ref 0–39)

## 2009-08-17 ENCOUNTER — Encounter (INDEPENDENT_AMBULATORY_CARE_PROVIDER_SITE_OTHER): Payer: Self-pay | Admitting: *Deleted

## 2009-09-07 ENCOUNTER — Ambulatory Visit: Payer: Self-pay | Admitting: Family Medicine

## 2009-09-20 ENCOUNTER — Other Ambulatory Visit: Admission: RE | Admit: 2009-09-20 | Discharge: 2009-09-20 | Payer: Self-pay | Admitting: Family Medicine

## 2009-09-20 ENCOUNTER — Ambulatory Visit: Payer: Self-pay | Admitting: Family Medicine

## 2009-09-20 DIAGNOSIS — E785 Hyperlipidemia, unspecified: Secondary | ICD-10-CM | POA: Insufficient documentation

## 2009-09-20 DIAGNOSIS — E039 Hypothyroidism, unspecified: Secondary | ICD-10-CM | POA: Insufficient documentation

## 2009-09-20 DIAGNOSIS — C50919 Malignant neoplasm of unspecified site of unspecified female breast: Secondary | ICD-10-CM | POA: Insufficient documentation

## 2009-09-21 LAB — CONVERTED CEMR LAB
ALT: 27 units/L (ref 0–35)
AST: 32 units/L (ref 0–37)
Albumin: 4.2 g/dL (ref 3.5–5.2)
Alkaline Phosphatase: 69 units/L (ref 39–117)
BUN: 18 mg/dL (ref 6–23)
Basophils Absolute: 0.1 10*3/uL (ref 0.0–0.1)
Basophils Relative: 1 % (ref 0.0–3.0)
Bilirubin Urine: NEGATIVE
Bilirubin, Direct: 0 mg/dL (ref 0.0–0.3)
CO2: 27 meq/L (ref 19–32)
Calcium: 9.7 mg/dL (ref 8.4–10.5)
Chloride: 105 meq/L (ref 96–112)
Cholesterol: 297 mg/dL — ABNORMAL HIGH (ref 0–200)
Creatinine, Ser: 1.1 mg/dL (ref 0.4–1.2)
Direct LDL: 191.4 mg/dL
Eosinophils Absolute: 0.4 10*3/uL (ref 0.0–0.7)
Eosinophils Relative: 8.6 % — ABNORMAL HIGH (ref 0.0–5.0)
GFR calc non Af Amer: 53.01 mL/min (ref >60)
Glucose, Bld: 81 mg/dL (ref 70–99)
HCT: 39.5 % (ref 36.0–46.0)
HDL: 88.9 mg/dL (ref >39.00)
Hemoglobin, Urine: NEGATIVE
Hemoglobin: 13.4 g/dL (ref 12.0–15.0)
Ketones, ur: NEGATIVE mg/dL
Leukocytes, UA: NEGATIVE
Lymphocytes Relative: 24.8 % (ref 12.0–46.0)
Lymphs Abs: 1.3 10*3/uL (ref 0.7–4.0)
MCHC: 34 g/dL (ref 30.0–36.0)
MCV: 89.7 fL (ref 78.0–100.0)
Monocytes Absolute: 0.4 10*3/uL (ref 0.1–1.0)
Monocytes Relative: 7.2 % (ref 3.0–12.0)
Neutro Abs: 3 10*3/uL (ref 1.4–7.7)
Neutrophils Relative %: 58.4 % (ref 43.0–77.0)
Nitrite: NEGATIVE
Platelets: 403 10*3/uL — ABNORMAL HIGH (ref 150.0–400.0)
Potassium: 4.4 meq/L (ref 3.5–5.1)
RBC: 4.4 M/uL (ref 3.87–5.11)
RDW: 15.6 % — ABNORMAL HIGH (ref 11.5–14.6)
Sodium: 139 meq/L (ref 135–145)
Specific Gravity, Urine: 1.01 (ref 1.000–1.030)
TSH: 84.55 microintl units/mL — ABNORMAL HIGH (ref 0.35–5.50)
Total Bilirubin: 0.7 mg/dL (ref 0.3–1.2)
Total CHOL/HDL Ratio: 3
Total Protein, Urine: NEGATIVE mg/dL
Total Protein: 7.6 g/dL (ref 6.0–8.3)
Triglycerides: 92 mg/dL (ref 0.0–149.0)
Urine Glucose: NEGATIVE mg/dL
Urobilinogen, UA: 0.2 (ref 0.0–1.0)
VLDL: 18.4 mg/dL (ref 0.0–40.0)
WBC: 5.2 10*3/uL (ref 4.5–10.5)
pH: 5.5 (ref 5.0–8.0)

## 2009-09-28 ENCOUNTER — Encounter: Payer: Self-pay | Admitting: Family Medicine

## 2009-10-15 HISTORY — PX: CATARACT EXTRACTION W/ INTRAOCULAR LENS  IMPLANT, BILATERAL: SHX1307

## 2009-10-21 ENCOUNTER — Ambulatory Visit: Payer: Self-pay | Admitting: Oncology

## 2009-10-25 ENCOUNTER — Encounter: Payer: Self-pay | Admitting: Family Medicine

## 2009-10-25 LAB — COMPREHENSIVE METABOLIC PANEL
ALT: 19 U/L (ref 0–35)
AST: 20 U/L (ref 0–37)
Albumin: 4.4 g/dL (ref 3.5–5.2)
Alkaline Phosphatase: 70 U/L (ref 39–117)
BUN: 16 mg/dL (ref 6–23)
CO2: 25 mEq/L (ref 19–32)
Calcium: 9.9 mg/dL (ref 8.4–10.5)
Chloride: 104 mEq/L (ref 96–112)
Creatinine, Ser: 0.86 mg/dL (ref 0.40–1.20)
Glucose, Bld: 83 mg/dL (ref 70–99)
Potassium: 4.5 mEq/L (ref 3.5–5.3)
Sodium: 139 mEq/L (ref 135–145)
Total Bilirubin: 0.4 mg/dL (ref 0.3–1.2)
Total Protein: 7.1 g/dL (ref 6.0–8.3)

## 2009-10-25 LAB — CBC WITH DIFFERENTIAL/PLATELET
BASO%: 0.4 % (ref 0.0–2.0)
Basophils Absolute: 0 10*3/uL (ref 0.0–0.1)
EOS%: 10.8 % — ABNORMAL HIGH (ref 0.0–7.0)
Eosinophils Absolute: 0.7 10*3/uL — ABNORMAL HIGH (ref 0.0–0.5)
HCT: 38.3 % (ref 34.8–46.6)
HGB: 13 g/dL (ref 11.6–15.9)
LYMPH%: 27 % (ref 14.0–49.7)
MCH: 30.4 pg (ref 25.1–34.0)
MCHC: 33.8 g/dL (ref 31.5–36.0)
MCV: 89.9 fL (ref 79.5–101.0)
MONO#: 0.7 10*3/uL (ref 0.1–0.9)
MONO%: 11.6 % (ref 0.0–14.0)
NEUT#: 3.1 10*3/uL (ref 1.5–6.5)
NEUT%: 50.2 % (ref 38.4–76.8)
Platelets: 507 10*3/uL — ABNORMAL HIGH (ref 145–400)
RBC: 4.26 10*6/uL (ref 3.70–5.45)
RDW: 16.8 % — ABNORMAL HIGH (ref 11.2–14.5)
WBC: 6.2 10*3/uL (ref 3.9–10.3)
lymph#: 1.7 10*3/uL (ref 0.9–3.3)

## 2009-10-25 LAB — CANCER ANTIGEN 27.29: CA 27.29: 21 U/mL (ref 0–39)

## 2009-10-25 LAB — LACTATE DEHYDROGENASE: LDH: 180 U/L (ref 94–250)

## 2009-10-25 LAB — VITAMIN D 25 HYDROXY (VIT D DEFICIENCY, FRACTURES): Vit D, 25-Hydroxy: 28 ng/mL — ABNORMAL LOW (ref 30–89)

## 2009-10-28 ENCOUNTER — Ambulatory Visit (HOSPITAL_COMMUNITY): Admission: RE | Admit: 2009-10-28 | Discharge: 2009-10-28 | Payer: Self-pay | Admitting: Oncology

## 2009-11-09 ENCOUNTER — Ambulatory Visit: Payer: Self-pay | Admitting: Family Medicine

## 2009-11-09 DIAGNOSIS — L2089 Other atopic dermatitis: Secondary | ICD-10-CM | POA: Insufficient documentation

## 2009-11-23 ENCOUNTER — Ambulatory Visit: Payer: Self-pay | Admitting: Oncology

## 2009-12-13 ENCOUNTER — Ambulatory Visit: Payer: Self-pay | Admitting: Family Medicine

## 2009-12-20 ENCOUNTER — Ambulatory Visit: Payer: Self-pay | Admitting: Family Medicine

## 2009-12-20 LAB — CONVERTED CEMR LAB
Cholesterol: 241 mg/dL — ABNORMAL HIGH (ref 0–200)
Direct LDL: 133.4 mg/dL
HDL: 101.7 mg/dL (ref >39.00)
TSH: 0.36 microintl units/mL (ref 0.35–5.50)
Total CHOL/HDL Ratio: 2
Triglycerides: 93 mg/dL (ref 0.0–149.0)
VLDL: 18.6 mg/dL (ref 0.0–40.0)

## 2010-02-15 ENCOUNTER — Ambulatory Visit: Payer: Self-pay | Admitting: Family Medicine

## 2010-03-02 ENCOUNTER — Telehealth: Payer: Self-pay | Admitting: Family Medicine

## 2010-03-02 LAB — CONVERTED CEMR LAB: TSH: 0.64 microintl units/mL (ref 0.35–5.50)

## 2010-03-31 ENCOUNTER — Ambulatory Visit: Payer: Self-pay | Admitting: Oncology

## 2010-04-04 LAB — CBC WITH DIFFERENTIAL/PLATELET
BASO%: 1.2 % (ref 0.0–2.0)
Basophils Absolute: 0.1 10*3/uL (ref 0.0–0.1)
EOS%: 6.1 % (ref 0.0–7.0)
Eosinophils Absolute: 0.3 10*3/uL (ref 0.0–0.5)
HCT: 38.8 % (ref 34.8–46.6)
HGB: 13.3 g/dL (ref 11.6–15.9)
LYMPH%: 22.4 % (ref 14.0–49.7)
MCH: 30.5 pg (ref 25.1–34.0)
MCHC: 34.2 g/dL (ref 31.5–36.0)
MCV: 89.1 fL (ref 79.5–101.0)
MONO#: 0.6 10*3/uL (ref 0.1–0.9)
MONO%: 10.9 % (ref 0.0–14.0)
NEUT#: 3.3 10*3/uL (ref 1.5–6.5)
NEUT%: 59.4 % (ref 38.4–76.8)
Platelets: 438 10*3/uL — ABNORMAL HIGH (ref 145–400)
RBC: 4.35 10*6/uL (ref 3.70–5.45)
RDW: 14.8 % — ABNORMAL HIGH (ref 11.2–14.5)
WBC: 5.6 10*3/uL (ref 3.9–10.3)
lymph#: 1.2 10*3/uL (ref 0.9–3.3)

## 2010-04-04 LAB — COMPREHENSIVE METABOLIC PANEL
ALT: 28 U/L (ref 0–35)
AST: 28 U/L (ref 0–37)
Albumin: 4.1 g/dL (ref 3.5–5.2)
Alkaline Phosphatase: 70 U/L (ref 39–117)
BUN: 12 mg/dL (ref 6–23)
CO2: 26 mEq/L (ref 19–32)
Calcium: 9.7 mg/dL (ref 8.4–10.5)
Chloride: 108 mEq/L (ref 96–112)
Creatinine, Ser: 0.93 mg/dL (ref 0.40–1.20)
Glucose, Bld: 95 mg/dL (ref 70–99)
Potassium: 4.4 mEq/L (ref 3.5–5.3)
Sodium: 140 mEq/L (ref 135–145)
Total Bilirubin: 0.6 mg/dL (ref 0.3–1.2)
Total Protein: 7.1 g/dL (ref 6.0–8.3)

## 2010-04-04 LAB — LACTATE DEHYDROGENASE: LDH: 173 U/L (ref 94–250)

## 2010-04-05 LAB — VITAMIN D 25 HYDROXY (VIT D DEFICIENCY, FRACTURES): Vit D, 25-Hydroxy: 39 ng/mL (ref 30–89)

## 2010-04-05 LAB — CANCER ANTIGEN 27.29: CA 27.29: 21 U/mL (ref 0–39)

## 2010-06-16 ENCOUNTER — Ambulatory Visit: Payer: Self-pay | Admitting: Oncology

## 2010-09-25 ENCOUNTER — Ambulatory Visit: Payer: Self-pay | Admitting: Oncology

## 2010-09-27 LAB — CBC WITH DIFFERENTIAL/PLATELET
BASO%: 0.6 % (ref 0.0–2.0)
Basophils Absolute: 0 10*3/uL (ref 0.0–0.1)
EOS%: 3.2 % (ref 0.0–7.0)
Eosinophils Absolute: 0.2 10*3/uL (ref 0.0–0.5)
HCT: 42.5 % (ref 34.8–46.6)
HGB: 14.3 g/dL (ref 11.6–15.9)
LYMPH%: 29.3 % (ref 14.0–49.7)
MCH: 30.6 pg (ref 25.1–34.0)
MCHC: 33.5 g/dL (ref 31.5–36.0)
MCV: 91.4 fL (ref 79.5–101.0)
MONO#: 0.6 10*3/uL (ref 0.1–0.9)
MONO%: 9.6 % (ref 0.0–14.0)
NEUT#: 3.6 10*3/uL (ref 1.5–6.5)
NEUT%: 57.3 % (ref 38.4–76.8)
Platelets: 424 10*3/uL — ABNORMAL HIGH (ref 145–400)
RBC: 4.65 10*6/uL (ref 3.70–5.45)
RDW: 15.1 % — ABNORMAL HIGH (ref 11.2–14.5)
WBC: 6.3 10*3/uL (ref 3.9–10.3)
lymph#: 1.8 10*3/uL (ref 0.9–3.3)

## 2010-09-28 LAB — COMPREHENSIVE METABOLIC PANEL
ALT: 51 U/L — ABNORMAL HIGH (ref 0–35)
AST: 41 U/L — ABNORMAL HIGH (ref 0–37)
Albumin: 4.4 g/dL (ref 3.5–5.2)
Alkaline Phosphatase: 98 U/L (ref 39–117)
BUN: 13 mg/dL (ref 6–23)
CO2: 26 mEq/L (ref 19–32)
Calcium: 9.8 mg/dL (ref 8.4–10.5)
Chloride: 104 mEq/L (ref 96–112)
Creatinine, Ser: 0.81 mg/dL (ref 0.40–1.20)
Glucose, Bld: 80 mg/dL (ref 70–99)
Potassium: 5.1 mEq/L (ref 3.5–5.3)
Sodium: 141 mEq/L (ref 135–145)
Total Bilirubin: 0.4 mg/dL (ref 0.3–1.2)
Total Protein: 7.2 g/dL (ref 6.0–8.3)

## 2010-09-28 LAB — CANCER ANTIGEN 27.29: CA 27.29: 24 U/mL (ref 0–39)

## 2010-09-28 LAB — VITAMIN D 25 HYDROXY (VIT D DEFICIENCY, FRACTURES): Vit D, 25-Hydroxy: 50 ng/mL (ref 30–89)

## 2010-09-28 LAB — LACTATE DEHYDROGENASE: LDH: 148 U/L (ref 94–250)

## 2010-10-10 ENCOUNTER — Encounter: Payer: Self-pay | Admitting: Family Medicine

## 2010-10-17 ENCOUNTER — Ambulatory Visit
Admission: RE | Admit: 2010-10-17 | Discharge: 2010-10-17 | Payer: Self-pay | Source: Home / Self Care | Attending: Family Medicine | Admitting: Family Medicine

## 2010-11-04 ENCOUNTER — Other Ambulatory Visit: Payer: Self-pay | Admitting: Oncology

## 2010-11-04 DIAGNOSIS — Z853 Personal history of malignant neoplasm of breast: Secondary | ICD-10-CM

## 2010-11-09 ENCOUNTER — Other Ambulatory Visit: Payer: Self-pay | Admitting: Family Medicine

## 2010-11-09 ENCOUNTER — Other Ambulatory Visit (HOSPITAL_COMMUNITY): Admission: RE | Admit: 2010-11-09 | Payer: MEDICARE | Source: Ambulatory Visit | Admitting: Family Medicine

## 2010-11-09 ENCOUNTER — Encounter: Payer: Self-pay | Admitting: Family Medicine

## 2010-11-09 ENCOUNTER — Other Ambulatory Visit (HOSPITAL_COMMUNITY)
Admission: RE | Admit: 2010-11-09 | Discharge: 2010-11-09 | Disposition: A | Discharge status: Home / Self Care | Payer: MEDICARE | Source: Ambulatory Visit | Attending: Family Medicine | Admitting: Family Medicine

## 2010-11-09 ENCOUNTER — Ambulatory Visit
Admission: RE | Admit: 2010-11-09 | Discharge: 2010-11-09 | Payer: Self-pay | Source: Home / Self Care | Attending: Family Medicine | Admitting: Family Medicine

## 2010-11-09 DIAGNOSIS — Z0000 Encounter for general adult medical examination without abnormal findings: Secondary | ICD-10-CM | POA: Insufficient documentation

## 2010-11-09 DIAGNOSIS — G4730 Sleep apnea, unspecified: Secondary | ICD-10-CM | POA: Insufficient documentation

## 2010-11-09 DIAGNOSIS — M129 Arthropathy, unspecified: Secondary | ICD-10-CM | POA: Insufficient documentation

## 2010-11-09 LAB — BASIC METABOLIC PANEL
BUN: 16 mg/dL (ref 6–23)
CO2: 27 mEq/L (ref 19–32)
Calcium: 9.8 mg/dL (ref 8.4–10.5)
Chloride: 102 mEq/L (ref 96–112)
Creatinine, Ser: 0.6 mg/dL (ref 0.4–1.2)
GFR: 115.12 mL/min (ref >60.00)
Glucose, Bld: 81 mg/dL (ref 70–99)
Potassium: 4.9 mEq/L (ref 3.5–5.1)
Sodium: 139 mEq/L (ref 135–145)

## 2010-11-09 LAB — CBC WITH DIFFERENTIAL/PLATELET
Basophils Absolute: 0.2 10*3/uL — ABNORMAL HIGH (ref 0.0–0.1)
Basophils Relative: 2.3 % (ref 0.0–3.0)
Eosinophils Absolute: 0.2 10*3/uL (ref 0.0–0.7)
Eosinophils Relative: 3.4 % (ref 0.0–5.0)
HCT: 46.2 % — ABNORMAL HIGH (ref 36.0–46.0)
Hemoglobin: 15.6 g/dL — ABNORMAL HIGH (ref 12.0–15.0)
Lymphocytes Relative: 30.5 % (ref 12.0–46.0)
Lymphs Abs: 2.1 10*3/uL (ref 0.7–4.0)
MCHC: 33.9 g/dL (ref 30.0–36.0)
MCV: 93.3 fl (ref 78.0–100.0)
Monocytes Absolute: 0.6 10*3/uL (ref 0.1–1.0)
Monocytes Relative: 8.9 % (ref 3.0–12.0)
Neutro Abs: 3.7 10*3/uL (ref 1.4–7.7)
Neutrophils Relative %: 54.9 % (ref 43.0–77.0)
Platelets: 384 10*3/uL (ref 150.0–400.0)
RBC: 4.95 Mil/uL (ref 3.87–5.11)
RDW: 14.9 % — ABNORMAL HIGH (ref 11.5–14.6)
WBC: 6.8 10*3/uL (ref 4.5–10.5)

## 2010-11-09 LAB — LIPID PANEL
Cholesterol: 265 mg/dL — ABNORMAL HIGH (ref 0–200)
HDL: 86.7 mg/dL (ref >39.00)
Total CHOL/HDL Ratio: 3
Triglycerides: 71 mg/dL (ref 0.0–149.0)
VLDL: 14.2 mg/dL (ref 0.0–40.0)

## 2010-11-09 LAB — CONVERTED CEMR LAB
Bilirubin Urine: NEGATIVE
Blood in Urine, dipstick: NEGATIVE
Glucose, Urine, Semiquant: NEGATIVE
Ketones, urine, test strip: NEGATIVE
Nitrite: NEGATIVE
Protein, U semiquant: NEGATIVE
Specific Gravity, Urine: 1.01
Urobilinogen, UA: 0.2
pH: 5

## 2010-11-09 LAB — LDL CHOLESTEROL, DIRECT: Direct LDL: 166.1 mg/dL

## 2010-11-09 LAB — HEPATIC FUNCTION PANEL
ALT: 129 U/L — ABNORMAL HIGH (ref 0–35)
AST: 97 U/L — ABNORMAL HIGH (ref 0–37)
Albumin: 4 g/dL (ref 3.5–5.2)
Alkaline Phosphatase: 99 U/L (ref 39–117)
Bilirubin, Direct: 0.1 mg/dL (ref 0.0–0.3)
Total Bilirubin: 0.6 mg/dL (ref 0.3–1.2)
Total Protein: 7.3 g/dL (ref 6.0–8.3)

## 2010-11-09 LAB — TSH: TSH: 0.1 u[IU]/mL — ABNORMAL LOW (ref 0.35–5.50)

## 2010-11-09 LAB — HM PAP SMEAR

## 2010-11-11 LAB — CONVERTED CEMR LAB: Vit D, 25-Hydroxy: 53 ng/mL (ref 30–89)

## 2010-11-14 NOTE — Assessment & Plan Note (Signed)
Summary: sinus infection/mhf   Vital Signs:  Patient Profile:   65 Years Old Female Height:     65.5 inches Temp:     98.3 degrees F Pulse rate:   96 / minute BP sitting:   130 / 80  (left arm) Cuff size:   regular  Vitals Entered By: Pura Spice, RN (June 23, 2008 2:06 PM)                 Chief Complaint:  sinus inf and rt earache .  History of Present Illness: For past two weeks has had sinus infection and earache which has increased in severity green drainage copd staus unchanged fibromyalgia persist and recommend trying lyrica, pt out of Mobic, to refill no other complaints    Current Allergies (reviewed today): ! SULFA     Review of Systems      See HPI  ENT      Complains of earache and sinus pressure.  Resp      copd unchanged, good controll at present   Physical Exam  General:     Well-developed,well-nourished,in no acute distress; alert,appropriate and cooperative throughout examination Head:     Normocephalic and atraumatic without obvious abnormalities. No apparent alopecia or balding. Eyes:     No corneal or conjunctival inflammation noted. EOMI. Perrla. Funduscopic exam benign, without hemorrhages, exudates or papilledema. Vision grossly normal. Ears:     rt TM erthematous Nose:     green nasal drainage, maxillary tenderness Mouth:     pharyngeal erythema.   Lungs:     rhochi, deminished breathe sounds Heart:     Normal rate and regular rhythm. S1 and S2 normal without gallop, murmur, click, rub or other extra sounds.    Impression & Recommendations:  Problem # 1:  ACUTE SINUSITIS, UNSPECIFIED (ICD-461.9) Assessment: Deteriorated  Her updated medication list for this problem includes:    Mucinex D [phone] Mg Tb12 (Pseudoephedrine-guaifenesin) .Marland Kitchen... 1 two times a day    Ciprofloxacin Hcl 500 Mg Tabs (Ciprofloxacin hcl) .Marland Kitchen... 1 two times a day for sinusitis and ear infection    Mucinex D [phone] Mg Xr12h-tab  (Pseudoephedrine-guaifenesin) .Marland Kitchen... 1 bid   Problem # 2:  OTITIS MEDIA, RIGHT (ICD-382.9) Assessment: New  Her updated medication list for this problem includes:    Meloxicam 15 Mg Tabs (Meloxicam) .Marland Kitchen... 1 tab once daily    Adult Aspirin Ec Low Strength 81 Mg Tbec (Aspirin) .Marland Kitchen... 1 once daily    Ciprofloxacin Hcl 500 Mg Tabs (Ciprofloxacin hcl) .Marland Kitchen... 1 two times a day for sinusitis and ear infection   Problem # 3:  FIBROMYALGIA (ICD-729.1) Assessment: Unchanged  Her updated medication list for this problem includes:    Meloxicam 15 Mg Tabs (Meloxicam) .Marland Kitchen... 1 tab once daily    Adult Aspirin Ec Low Strength 81 Mg Tbec (Aspirin) .Marland Kitchen... 1 once daily   Problem # 4:  ACUTE MAXILLARY SINUSITIS (ICD-461.0) Assessment: Deteriorated  Her updated medication list for this problem includes:    Mucinex D [phone] Mg Tb12 (Pseudoephedrine-guaifenesin) .Marland Kitchen... 1 two times a day    Ciprofloxacin Hcl 500 Mg Tabs (Ciprofloxacin hcl) .Marland Kitchen... 1 two times a day for sinusitis and ear infection    Mucinex D [phone] Mg Xr12h-tab (Pseudoephedrine-guaifenesin) .Marland Kitchen... 1 bid   Problem # 5:  HYPERTENSION (ICD-401.9) Assessment: Improved  Her updated medication list for this problem includes:    Lotrel 10-20 Mg Caps (Amlodipine besy-benazepril hcl) ..... Once daily   Problem # 6:  COPD (ICD-496) Assessment: Unchanged  Her updated medication list for this problem includes:    Advair Diskus 250-50 Mcg/dose Misc (Fluticasone-salmeterol) .Marland Kitchen... 1 puff two times a day    Symbicort 80-4.5 Mcg/act Aero (Budesonide-formoterol fumarate) .Marland Kitchen... 2 inhalations two times a day instead of advair    Proair Hfa 108 (90 Base) Mcg/act Aers (Albuterol sulfate) .Marland Kitchen... 2 inh three times a day as needed shortness of breathe or wheezing   Complete Medication List: 1)  Meloxicam 15 Mg Tabs (Meloxicam) .Marland Kitchen.. 1 tab once daily 2)  Lorazepam 2 Mg Tabs (Lorazepam) .Marland Kitchen.. 1 tab three times a day as needed 3)  Estradiol 1 Mg Tabs  (Estradiol) .Marland Kitchen.. 1 tab once daily 4)  Nexium 40 Mg Cpdr (Esomeprazole magnesium) .Marland Kitchen.. 1 once daily 5)  Advair Diskus 250-50 Mcg/dose Misc (Fluticasone-salmeterol) .Marland Kitchen.. 1 puff two times a day 6)  Adult Aspirin Ec Low Strength 81 Mg Tbec (Aspirin) .Marland Kitchen.. 1 once daily 7)  Mucinex D [phone] Mg Tb12 (Pseudoephedrine-guaifenesin) .Marland Kitchen.. 1 two times a day 8)  Symbicort 80-4.5 Mcg/act Aero (Budesonide-formoterol fumarate) .... 2 inhalations two times a day instead of advair 9)  Proair Hfa 108 (90 Base) Mcg/act Aers (Albuterol sulfate) .... 2 inh three times a day as needed shortness of breathe or wheezing 10)  Claritin 10 Mg Tabs (Loratadine) .Marland Kitchen.. 1 by mouth once daily as needed 11)  Lotrel 10-20 Mg Caps (Amlodipine besy-benazepril hcl) .... Once daily 12)  Ciprofloxacin Hcl 500 Mg Tabs (Ciprofloxacin hcl) .Marland Kitchen.. 1 two times a day for sinusitis and ear infection 13)  Lyrica 50 Mg Caps (Pregabalin) .Marland Kitchen.. 1 three times a day for fibromyalgia 14)  Mucinex D [phone] Mg Xr12h-tab (Pseudoephedrine-guaifenesin) .Marland Kitchen.. 1 bid   Patient Instructions: 1)  sinusitis and ear infection 2)  TX  cipro 500mg  two times a day 3)  for 30 days 4)  Proair inhsaler refills available 5)  Lyrica sple 50 mg 1 three times a day 6)  for fibromyalgia 7)  nasal spray 2 sprays eACH NOSTRIL EACH DAY   Prescriptions: CIPROFLOXACIN HCL 500 MG TABS (CIPROFLOXACIN HCL) 1 two times a day for sinusitis and ear infection  #60 x 1   Entered and Authorized by:   Judithann Sheen MD   Signed by:   Judithann Sheen MD on 06/23/2008   Method used:   Electronically to        [facility]  [phone]* (retail)       [address]       [place], [place]  [address]       Ph: [phone] or [phone]       Fax: [phone]   RxID:   [id number] PROAIR HFA 108 (90 BASE) MCG/ACT  AERS (ALBUTEROL SULFATE) 2 inh three times a day as needed shortness of breathe or wheezing  #1 x 11   Entered and  Authorized by:   Judithann Sheen MD   Signed by:   Judithann Sheen MD on 06/23/2008   Method used:   Electronically to        [facility]  [phone]* (retail)       [address]       [place], [place]  [address]       Ph: [phone] or [phone]       Fax: [phone]   RxID:   [id number]  ]

## 2010-11-14 NOTE — Letter (Signed)
Summary: [facility]  [facility]   Imported By: Maryln Gottron 02/28/2009 09:15:14  _____________________________________________________________________  External Attachment:    Type:   Image     Comment:   External Document

## 2010-11-14 NOTE — Assessment & Plan Note (Signed)
Summary: CPX/CCM   Vital Signs:  Patient Profile:   65 Years Old Female Height:     65.5 inches Weight:      134 pounds Temp:     98.2 degrees F oral Pulse rate:   72 / minute Pulse rhythm:   regular BP sitting:   142 / 76  Vitals Entered By: Lynann Beaver CMA (September 09, 2007 1:44 PM)                 Chief Complaint:  cpx and pap.  Current Allergies: ! SULFA     Review of Systems      See HPI   Physical Exam  General:     Well-developed,well-nourished,in no acute distress; alert,appropriate and cooperative throughout examination Head:     Normocephalic and atraumatic without obvious abnormalities. No apparent alopecia or balding. Eyes:     No corneal or conjunctival inflammation noted. EOMI. Perrla. Funduscopic exam benign, without hemorrhages, exudates or papilledema. Vision grossly normal. Ears:     External ear exam shows no significant lesions or deformities.  Otoscopic examination reveals clear canals, tympanic membranes are intact bilaterally without bulging, retraction, inflammation or discharge. Hearing is grossly normal bilaterally. Nose:     External nasal examination shows no deformity or inflammation. Nasal mucosa are pink and moist without lesions or exudates. Mouth:     Oral mucosa and oropharynx without lesions or exudates.  Teeth in good repair. Neck:     No deformities, masses, or tenderness noted. Chest Wall:     No deformities, masses, or tenderness noted. Breasts:     No mass, nodules, thickening, tenderness, bulging, retraction, inflamation, nipple discharge or skin changes noted.   Lungs:     decreased breathe sounds ,expiratory wheezes evident early copd Heart:     Normal rate and regular rhythm. S1 and S2 normal without gallop, murmur, click, rub or other extra sounds. Abdomen:     Bowel sounds positive,abdomen soft and non-tender without masses, organomegaly or hernias noted. Rectal:     No external abnormalities noted. Normal  sphincter tone. No rectal masses or tenderness. Genitalia:     absent uterus, pap done Msk:     No deformity or scoliosis noted of thoracic or lumbar spine.   Pulses:     R and L carotid,radial,femoral,dorsalis pedis and posterior tibial pulses are full and equal bilaterally Extremities:     No clubbing, cyanosis, edema, or deformity noted with normal full range of motion of all joints.   Neurologic:     No cranial nerve deficits noted. Station and gait are normal. Plantar reflexes are down-going bilaterally. DTRs are symmetrical throughout. Sensory, motor and coordinative functions appear intact. Skin:     Intact without suspicious lesions or rashes Cervical Nodes:     No lymphadenopathy noted Axillary Nodes:     No palpable lymphadenopathy Inguinal Nodes:     No significant adenopathy Psych:     Cognition and judgment appear intact. Alert and cooperative with normal attention span and concentration. No apparent delusions, illusions, hallucinations    Complete Medication List: 1)  Meloxicam 15 Mg Tabs (Meloxicam) .Marland Kitchen.. 1 tab once daily 2)  Lorazepam 2 Mg Tabs (Lorazepam) .Marland Kitchen.. 1 tab three times a day as needed 3)  Estradiol 1 Mg Tabs (Estradiol) .Marland Kitchen.. 1 tab once daily 4)  Nexium 40 Mg Cpdr (Esomeprazole magnesium) .Marland Kitchen.. 1 once daily 5)  Amitriptyline Hcl 25 Mg Tabs (Amitriptyline hcl) .... 2 tabs  bedtime 6)  Lotrel 5-20 Mg  Caps (Amlodipine besy-benazepril hcl) .Marland Kitchen.. 1 tab once daily 7)  Advair Diskus 250-50 Mcg/dose Misc (Fluticasone-salmeterol) .Marland Kitchen.. 1 puff two times a day 8)  Adult Aspirin Ec Low Strength 81 Mg Tbec (Aspirin) .Marland Kitchen.. 1 once daily 9)  Hycodan 5-1.5 Mg/32ml Syrp (Hydrocodone-homatropine) .... 2 tsp q4h as needed cough   Patient Instructions: 1)  to get lyrica sples     Chief Complaint:  cpx and pap.  Prescriptions: HYCODAN 5-1.5 MG/5ML  SYRP (HYDROCODONE-HOMATROPINE) 2 tsp q4h as needed cough  #360cc x 3   Entered and Authorized by:   Judithann Sheen MD    Signed by:   Judithann Sheen MD on 10/28/2007   Method used:   Print then Give to Patient   RxID:   [id number] ADVAIR DISKUS 250-50 MCG/DOSE  MISC (FLUTICASONE-SALMETEROL) 1 puff two times a day  #1 x 11   Entered and Authorized by:   Judithann Sheen MD   Signed by:   Archie Endo RN on 10/13/2007   Method used:   Electronically sent to ...       [facility]  [address]  [phone]*       [address]       [place], [place]  [address]       Ph: [phone] or [phone]       Fax: [phone]   RxID:   [id number] LOTREL 5-20 MG  CAPS (AMLODIPINE BESY-BENAZEPRIL HCL) 1 tab once daily  #30 x 11   Entered and Authorized by:   Judithann Sheen MD   Signed by:   Judithann Sheen MD on 09/10/2007   Method used:   Electronically sent to ...       [facility]  [address]  [phone]*       [address]       [place], [place]  [address]       Ph: [phone] or [phone]       Fax: [phone]   RxID:   [id number] AMITRIPTYLINE HCL 25 MG  TABS (AMITRIPTYLINE HCL) 2 tabs  bedtime  #60 x 11   Entered and Authorized by:   Judithann Sheen MD   Signed by:   Judithann Sheen MD on 09/10/2007   Method used:   Electronically sent to ...       [facility]  [address]  [phone]*       [address]       [place], [place]  [address]       Ph: [phone] or [phone]       Fax: [phone]   RxID:   [id number] NEXIUM 40 MG  CPDR (ESOMEPRAZOLE MAGNESIUM) 1 once daily  #30 x 11   Entered and Authorized by:   Judithann Sheen MD   Signed by:   Judithann Sheen MD on 09/10/2007   Method used:   Electronically sent to ...       [facility]  [address]  [phone]*       [address]       [place], [place]  [address]       Ph: [phone] or [phone]       Fax: [phone]   RxID:   [id number] ESTRADIOL 1 MG  TABS (ESTRADIOL) 1 tab  once  daily  #30 x 11   Entered and Authorized by:   Judithann Sheen MD   Signed by:   Judithann Sheen MD on 09/10/2007   Method used:   Electronically sent to ...       [facility]  [address]  [phone]*       [address]       [place], [place]  [address]       Ph: [phone] or [phone]       Fax: [phone]   RxID:   [id number] LORAZEPAM 2 MG  TABS (LORAZEPAM) 1 tab three times a day as needed  #90 x 5   Entered and Authorized by:   Judithann Sheen MD   Signed by:   Judithann Sheen MD on 09/10/2007   Method used:   Electronically sent to ...       [facility]  [address]  [phone]*       [address]       [place], [place]  [address]       Ph: [phone] or [phone]       Fax: [phone]   RxID:   [id number] MELOXICAM 15 MG  TABS (MELOXICAM) 1 tab once daily  #30 x 11   Entered and Authorized by:   Judithann Sheen MD   Signed by:   Judithann Sheen MD on 09/10/2007   Method used:   Electronically sent to ...       [facility]  [address]  [phone]*       [address]       [place], [place]  [address]       Ph: [phone] or [phone]       Fax: [phone]   RxID:   [id number]  ]

## 2010-11-14 NOTE — Letter (Signed)
Summary: [facility]   [facility]   Imported By: Maryln Gottron 03/16/2009 11:16:00  _____________________________________________________________________  External Attachment:    Type:   Image     Comment:   External Document

## 2010-11-14 NOTE — Progress Notes (Signed)
Summary: sinus infection sx, requestin RX  Medications Added ESTRADIOL 1 MG TABS (ESTRADIOL)  LORAZEPAM 2 MG TABS (LORAZEPAM)        Phone Note Call from Patient Call back at [facility] Phone [phone]   Caller: Patient Call For: stafford Summary of Call: sinus infection Initial call taken by: Roselle Locus,  August 19, 2007 8:57 AM  Follow-up for Phone Call        Called pt back,s he reports she has been very weak & tired, her ears hurt, headache, coughing all night with alot of drainage.  Pt denies temp, pt has been using Mucinex X 1 1/[redacted] weeks along with [facility] Sinus relief and Saline Nasal Spray. Pt has OV with Dr Scotty Court on 11/25 [facility] on [address]. Follow-up by: Sid Falcon LPN,  August 19, 2007 9:12 AM  Additional Follow-up for Phone Call Additional follow up Details #1::        SEE IF SHE CAN COME THURS. NOV 6 08 AT 10"30 AND IF SHE CAN --ADD HER TO THE [facility] PLEASE AND THANKS Additional Follow-up by: Pura Spice, RN,  August 19, 2007 10:41 AM    Additional Follow-up for Phone Call Additional follow up Details #2::    LMTCB to schedule OV 11/6 ..................................................................Marland KitchenSid Falcon LPN  August 19, 2007 2:44 PM  Called pt again, left detailed message on home phone pt scheduled with Dr Scotty Court on 11/6 @ 10:30 am per Almira Coaster.  Msg states if she is unable to come to that appt, please call and cancel.  Follow-up by: Sid Falcon LPN,  August 19, 2007 2:50 PM  New/Updated Medications: ESTRADIOL 1 MG TABS (ESTRADIOL)  LORAZEPAM 2 MG TABS (LORAZEPAM)

## 2010-11-14 NOTE — Letter (Signed)
Summary: [facility]  [facility]   Imported By: Maryln Gottron 04/21/2009 10:55:27  _____________________________________________________________________  External Attachment:    Type:   Image     Comment:   External Document

## 2010-11-14 NOTE — Letter (Signed)
Summary: [facility]  [facility]   Imported By: Maryln Gottron 05/25/2009 10:45:16  _____________________________________________________________________  External Attachment:    Type:   Image     Comment:   External Document

## 2010-11-14 NOTE — Letter (Signed)
Summary: [facility]  [facility]   Imported By: Maryln Gottron 06/15/2009 12:41:41  _____________________________________________________________________  External Attachment:    Type:   Image     Comment:   External Document

## 2010-11-14 NOTE — Assessment & Plan Note (Signed)
Summary: elevated BP/dm   Vital Signs:  Patient Profile:   65 Years Old Female Height:     65.5 inches Weight:      134 pounds Temp:     98.2 degrees F oral Pulse rate:   93 / minute BP sitting:   142 / 80  (left arm) Cuff size:   regular  Vitals Entered By: Alfred Levins, CMA (May 25, 2008 10:36 AM)                 Chief Complaint:  Hypertension Management.  History of Present Illness: [name], has had recurrent sinus pressure, PND, and coughing up green sputum for several weeks. No fever. Also her BP has been running high for several months, often up to 180's systolic.     Current Allergies (reviewed today): ! SULFA  Past Medical History:    Reviewed history from 09/08/2007 and no changes required:       migraine headache       Diverticulitis, hx of       Colonic polyps, hx of       COPD       GERD       Hypertension       fibromyalgia     Review of Systems      See HPI   Physical Exam  General:     Well-developed,well-nourished,in no acute distress; alert,appropriate and cooperative throughout examination Head:     Normocephalic and atraumatic without obvious abnormalities. No apparent alopecia or balding. Eyes:     No corneal or conjunctival inflammation noted. EOMI. Perrla. Funduscopic exam benign, without hemorrhages, exudates or papilledema. Vision grossly normal. Ears:     External ear exam shows no significant lesions or deformities.  Otoscopic examination reveals clear canals, tympanic membranes are intact bilaterally without bulging, retraction, inflammation or discharge. Hearing is grossly normal bilaterally. Nose:     External nasal examination shows no deformity or inflammation. Nasal mucosa are pink and moist without lesions or exudates. Mouth:     Oral mucosa and oropharynx without lesions or exudates.  Teeth in good repair. Neck:     No deformities, masses, or tenderness noted. Lungs:     scattered soft wheezes Heart:     Normal rate  and regular rhythm. S1 and S2 normal without gallop, murmur, click, rub or other extra sounds.    Impression & Recommendations:  Problem # 1:  ACUTE SINUSITIS, UNSPECIFIED (ICD-461.9)  The following medications were removed from the medication list:    Hycodan 5-1.5 Mg/26ml Syrp (Hydrocodone-homatropine) .Marland Kitchen... 2 tsp q4h as needed cough    Doxycycline Hyclate 100 Mg Caps (Doxycycline hyclate) .Marland Kitchen... 1 two times a day  Her updated medication list for this problem includes:    Mucinex D [phone] Mg Tb12 (Pseudoephedrine-guaifenesin) .Marland Kitchen... 1 two times a day    Augmentin 875-125 Mg Tabs (Amoxicillin-pot clavulanate) .Marland Kitchen..Marland Kitchen Two times a day   Problem # 2:  HYPERTENSION (ICD-401.9)  The following medications were removed from the medication list:    Lotrel 5-20 Mg Caps (Amlodipine besy-benazepril hcl) .Marland Kitchen... 1 tab once daily  Her updated medication list for this problem includes:    Lotrel 10-20 Mg Caps (Amlodipine besy-benazepril hcl) ..... Once daily   Complete Medication List: 1)  Meloxicam 15 Mg Tabs (Meloxicam) .Marland Kitchen.. 1 tab once daily 2)  Lorazepam 2 Mg Tabs (Lorazepam) .Marland Kitchen.. 1 tab three times a day as needed 3)  Estradiol 1 Mg Tabs (Estradiol) .Marland Kitchen.. 1 tab once daily 4)  Nexium 40 Mg Cpdr (Esomeprazole magnesium) .Marland Kitchen.. 1 once daily 5)  Advair Diskus 250-50 Mcg/dose Misc (Fluticasone-salmeterol) .Marland Kitchen.. 1 puff two times a day 6)  Adult Aspirin Ec Low Strength 81 Mg Tbec (Aspirin) .Marland Kitchen.. 1 once daily 7)  Mucinex D [phone] Mg Tb12 (Pseudoephedrine-guaifenesin) .Marland Kitchen.. 1 two times a day 8)  Symbicort 80-4.5 Mcg/act Aero (Budesonide-formoterol fumarate) .... 2 inhalations two times a day instead of advair 9)  Proair Hfa 108 (90 Base) Mcg/act Aers (Albuterol sulfate) .... 2 inh q4h as needed shortness of breath 10)  Claritin 10 Mg Tabs (Loratadine) .Marland Kitchen.. 1 by mouth once daily as needed 11)  Lotrel 10-20 Mg Caps (Amlodipine besy-benazepril hcl) .... Once daily 12)  Augmentin 875-125 Mg Tabs  (Amoxicillin-pot clavulanate) .... Two times a day   Patient Instructions: 1)  Please schedule a follow-up appointment in 2 weeks.   Prescriptions: AUGMENTIN 875-125 MG  TABS (AMOXICILLIN-POT CLAVULANATE) two times a day  #20 x 0   Entered and Authorized by:   Nelwyn Salisbury MD   Signed by:   Nelwyn Salisbury MD on 05/25/2008   Method used:   Electronically sent to ...       [facility]  [address]  [phone]*       [address]       [place], [place]  [address]       Ph: [phone] or [phone]       Fax: [phone]   RxID:   [id number] LOTREL 10-20 MG  CAPS (AMLODIPINE BESY-BENAZEPRIL HCL) once daily  #30 x 2   Entered and Authorized by:   Nelwyn Salisbury MD   Signed by:   Nelwyn Salisbury MD on 05/25/2008   Method used:   Electronically sent to ...       [facility]  [address]  [phone]*       [address]       [place], [place]  [address]       Ph: [phone] or [phone]       Fax: [phone]   RxID:   [id number]  ]

## 2010-11-14 NOTE — Assessment & Plan Note (Signed)
Summary: sinus infection/jls   Vital Signs:  Patient Profile:   65 Years Old Female Height:     65.5 inches Temp:     98.2 degrees F Pulse rate:   80 / minute BP sitting:   120 / 80  Vitals Entered By: Pura Spice, RN (November 18, 2007 8:42 AM)                 Chief Complaint:  refused to wt. c/o sinus problems off and on since Nov 08.  History of Present Illness: Pt has not cleared infection since Nov 08 sinus infection, facial pain, headache, weakness green drainage from nose as well as yellow sputum cough during day and night,responds to hycodan continues to have episodic  fibromyalgia, to try lyrica 50mg  am,100 mg  if works to change to neurontin to get chest xray if persists no other system problems  Current Allergies: ! SULFA    Risk Factors:    Review of Systems      See HPI   Physical Exam  General:     alert, well-developed, and well-nourished.  , slightly dehydrated Head:     Normocephalic and atraumatic without obvious abnormalities. No apparent alopecia or balding. Eyes:     No corneal or conjunctival inflammation noted. EOMI. Perrla. Funduscopic exam benign, without hemorrhages, exudates or papilledema. Vision grossly normal. Ears:     rt TM dull,not red,left clear Nose:     nasal congestion with green drainage Mouth:     pharynx red, post nasal drainage Neck:     No deformities, masses, or tenderness noted. Lungs:     decreased breathe sounds, expiratory wheezes, rhonchino accessory muscle use, no dullness, and no crackles.   Heart:     Normal rate and regular rhythm. S1 and S2 normal without gallop, murmur, click, rub or other extra sounds. Abdomen:     Bowel sounds positive,abdomen soft and non-tender without masses, organomegaly or hernias noted. Extremities:     No clubbing, cyanosis, edema, or deformity noted with normal full range of motion of all joints.      Complete Medication List: 1)  Meloxicam 15 Mg Tabs (Meloxicam)  .Marland Kitchen.. 1 tab once daily 2)  Lorazepam 2 Mg Tabs (Lorazepam) .Marland Kitchen.. 1 tab three times a day as needed 3)  Estradiol 1 Mg Tabs (Estradiol) .Marland Kitchen.. 1 tab once daily 4)  Nexium 40 Mg Cpdr (Esomeprazole magnesium) .Marland Kitchen.. 1 once daily 5)  Amitriptyline Hcl 25 Mg Tabs (Amitriptyline hcl) .... 2 tabs  bedtime 6)  Lotrel 5-20 Mg Caps (Amlodipine besy-benazepril hcl) .Marland Kitchen.. 1 tab once daily 7)  Advair Diskus 250-50 Mcg/dose Misc (Fluticasone-salmeterol) .Marland Kitchen.. 1 puff two times a day 8)  Adult Aspirin Ec Low Strength 81 Mg Tbec (Aspirin) .Marland Kitchen.. 1 once daily 9)  Hycodan 5-1.5 Mg/60ml Syrp (Hydrocodone-homatropine) .... 2 tsp q4h as needed cough 10)  Doxycycline Hyclate 100 Mg Caps (Doxycycline hyclate) .Marland Kitchen.. 1 two times a day 11)  Mucinex D [phone] Mg Tb12 (Pseudoephedrine-guaifenesin) .Marland Kitchen.. 1 two times a day 12)  Symbicort 80-4.5 Mcg/act Aero (Budesonide-formoterol fumarate) .... 2 inhalations two times a day instead of advair   Patient Instructions: 1)  doxycycline 100mg  two times a day refill 1 time as needed 2)  good fluid intake  3)  symbicort 80/4.5   2 inhalations two times a day 4)  Lyrica  50mg  1 aqm 2 pm 5)  mucinexD extra strenght 1 two times a day 6)  claritin 1  each day 7)  Tobacco is very bad for your health and your loved ones! You Should stop smoking!. 8)  continue other medications 9)  chest xray last yr. if problem persist,please call and we will repeat xray    Prescriptions: DOXYCYCLINE HYCLATE 100 MG  CAPS (DOXYCYCLINE HYCLATE) 1 two times a day  #30 x 0   Entered and Authorized by:   Judithann Sheen MD   Signed by:   Judithann Sheen MD on 11/18/2007   Method used:   Electronically sent to ...       [facility]  [address]  [phone]*       [address]       [place], [place]  [address]       Ph: [phone] or [phone]       Fax: [phone]   RxID:   [id number]  ]

## 2010-11-14 NOTE — Progress Notes (Signed)
Summary: rx to stop smoking   Phone Note Call from Patient Call back at Home Phone [phone]   Caller: pt vm triage Call For: stafford  Summary of Call: needs new rx for quit smoking [facility] [address]  Initial call taken by: Roselle Locus,  December 29, 2008 10:24 AM  Follow-up for Phone Call        dr.stafford spoke with patient. Prescription up front for chantix.  Follow-up by: Pura Spice, RN,  December 30, 2008 8:34 AM    New/Updated Medications: CHANTIX STARTING MONTH PAK 0.5 MG X 11 & 1 MG X 42 TABS (VARENICLINE TARTRATE) take as directed per package. CHANTIX CONTINUING MONTH PAK 1 MG TABS (VARENICLINE TARTRATE) take as directed   Prescriptions: CHANTIX CONTINUING MONTH PAK 1 MG TABS (VARENICLINE TARTRATE) take as directed  #30 x 2   Entered by:   Pura Spice, RN   Authorized by:   Judithann Sheen MD   Signed by:   Pura Spice, RN on 12/30/2008   Method used:   Print then Give to Patient   RxID:   [id number] CHANTIX STARTING MONTH PAK 0.5 MG X 11 & 1 MG X 42 TABS (VARENICLINE TARTRATE) take as directed per package.  #1 x 0   Entered by:   Pura Spice, RN   Authorized by:   Judithann Sheen MD   Signed by:   Pura Spice, RN on 12/30/2008   Method used:   Print then Give to Patient   RxID:   [id number]

## 2010-11-14 NOTE — Progress Notes (Signed)
Summary: rx lorazepam w 3 refills   Phone Note From Pharmacy   Caller: [facility]  [address]  [phone]* Reason for Call: Needs renewal Summary of Call: refill lorazepam  Initial call taken by: Pura Spice, RN,  Mar 02, 2009 4:14 PM  Follow-up for Phone Call        ok per dr Scotty Court with 3 refills. [facility]  Follow-up by: Pura Spice, RN,  Mar 02, 2009 4:14 PM      Prescriptions: LORAZEPAM 2 MG  TABS (LORAZEPAM) 1 tab three times a day as needed  #90 x 3   Entered by:   Pura Spice, RN   Authorized by:   Judithann Sheen MD   Signed by:   Pura Spice, RN on 03/02/2009   Method used:   Telephoned to ...       [facility]  [address]  [phone]* (retail)       [address]       [place], [place]  [address]       Ph: [phone] or [phone]       Fax: [phone]   RxID:   [id number]

## 2010-11-14 NOTE — Letter (Signed)
[facility]   Imported By: Maryln Gottron 11/23/2009 15:08:10  _____________________________________________________________________  External Attachment:    Type:   Image     Comment:   External Document

## 2010-11-14 NOTE — Progress Notes (Signed)
Summary: FAXED LABS RESULTS  Phone Note From Other Clinic   Caller[facility]  Call For: [facility] Summary of Call: I FAXED LABS RESULTS TO DR Lanora Manis [facility] AT [facility] TODAY. Initial call taken by: Celine Ahr,  October 21, 2008 10:14 AM

## 2010-11-14 NOTE — Letter (Signed)
Summary: [facility]   Imported By: Maryln Gottron 11/16/2008 11:24:01  _____________________________________________________________________  External Attachment:    Type:   Image     Comment:   External Document

## 2010-11-14 NOTE — Letter (Signed)
Summary: [facility]  [facility]   Imported By: Maryln Gottron 07/14/2009 09:13:31  _____________________________________________________________________  External Attachment:    Type:   Image     Comment:   External Document

## 2010-11-14 NOTE — Letter (Signed)
Summary: [facility]  [facility]   Imported By: Maryln Gottron 04/04/2009 09:44:20  _____________________________________________________________________  External Attachment:    Type:   Image     Comment:   External Document

## 2010-11-14 NOTE — Progress Notes (Signed)
Summary: lab result ankle swelling   Phone Note Call from Patient   Caller: Patient Summary of Call: wants lab results and c/o ankle swelling  Initial call taken by: Pura Spice, RN,  Mar 02, 2010 3:52 PM  Follow-up for Phone Call        per dr Scotty Court call in [facility]  50/75  Follow-up by: Pura Spice, RN,  Mar 02, 2010 3:52 PM  Additional Follow-up for Phone Call Additional follow up Details #1::        called in [facility] [address] pt aware.  Additional Follow-up by: Pura Spice, RN,  Mar 02, 2010 3:52 PM    New/Updated Medications: TRIAMTERENE-HCTZ 75-50 MG TABS (TRIAMTERENE-HCTZ) 1 by mouth once daily Prescriptions: TRIAMTERENE-HCTZ 75-50 MG TABS (TRIAMTERENE-HCTZ) 1 by mouth once daily  #30 x 5   Entered by:   Pura Spice, RN   Authorized by:   Judithann Sheen MD   Signed by:   Pura Spice, RN on 03/02/2010   Method used:   Electronically to        [facility]  [phone]* (retail)       [address]       [place], [place]  [address]       Ph: [phone] or [phone]       Fax: [phone]   RxID:   [id number]

## 2010-11-14 NOTE — Letter (Signed)
Summary: [facility]  [facility]   Imported By: Maryln Gottron 04/04/2009 08:54:16  _____________________________________________________________________  External Attachment:    Type:   Image     Comment:   External Document

## 2010-11-14 NOTE — Letter (Signed)
Summary: [facility]  [facility]   Imported By: Maryln Gottron 02/24/2009 09:01:03  _____________________________________________________________________  External Attachment:    Type:   Image     Comment:   External Document

## 2010-11-14 NOTE — Assessment & Plan Note (Signed)
Summary: cpx/cjr   Vital Signs:  Patient profile:   65 year old female Height:      65 inches Weight:      164 pounds BMI:     27.39 O2 Sat:      98 % Temp:     97.7 degrees F Pulse rate:   87 / minute BP sitting:   128 / 80  (left arm)  Vitals Entered By: Pura Spice, RN (September 20, 2009 1:51 PM) CC: cpx with pap had reconstructed breast surgery now has drain hamd mammogram and bone density  08/2009 Is Patient Diabetic? Yes Pain Assessment Patient in pain? no        History of Present Illness: this 65 year old white married female had a mastectomy with reconstruction in the past year. She is in for complete physical examination and review of her medical problems She has past history of arthritis and fibromyalgia Top smoking in March 2010 Long history of hypertension well controlled GERD is under control with Nexium Past history of anxiety relieved with lorazepam Mammogram, colonoscopic exam in bone densities done in 2010 arthritis not controlled well on scan to change treatment  Preventive Screening-Counseling & Management  Alcohol-Tobacco     Smoking Status: quit > 6 months     Packs/Day: 1.0     Year Started: 1961     Year Quit: 2010  Allergies: 1)  ! Sulfa  Past History:  Past Medical History: Last updated: 09/14/2008 migraine headache Diverticulitis, hx of Colonic polyps, hx of COPD GERD Hypertension fibromyalgia  PAP  last pap 2008  hx hystrectomy  MAMMOGRAM  "pt stated will sch " EYE EXAM EKG  2007 DEXA-BONE DENSITY  per pt waiting til gets on medicare due to insurance  PNEUMONIA VACCINE  SHINGLES VACCINE DT   2009 Colonscopy   2006 repeat 2011 SMOKER  ppd x 45 yrs   Risk Factors: Smoking Status: quit > 6 months (09/20/2009) Packs/Day: 1.0 (09/20/2009)  Past Surgical History: Hysterectomy Colectomy Appendectomy mastectomy with reconstructive surgery  Past History:  Care Management: Hematology/Oncology:  Dr  Donnie Coffin Gastroenterology:  Dr Ronney Asters   Social History: Smoking Status:  quit > 6 months Packs/Day:  1.0  Review of Systems      See HPI General:  Denies chills, fatigue, fever, loss of appetite, malaise, sleep disorder, sweats, weakness, and weight loss. Eyes:  Denies blurring, discharge, double vision, eye irritation, eye pain, halos, itching, light sensitivity, red eye, vision loss-1 eye, and vision loss-both eyes. ENT:  Denies decreased hearing, difficulty swallowing, ear discharge, earache, hoarseness, nasal congestion, nosebleeds, postnasal drainage, ringing in ears, sinus pressure, and sore throat. CV:  Denies bluish discoloration of lips or nails, chest pain or discomfort, difficulty breathing at night, difficulty breathing while lying down, fainting, fatigue, leg cramps with exertion, lightheadness, near fainting, palpitations, shortness of breath with exertion, swelling of feet, swelling of hands, and weight gain; hypertension. Resp:  Complains of cough and shortness of breath; COPD. GI:  Complains of indigestion; GERD controlled with Nexium. GU:  Denies abnormal vaginal bleeding, decreased libido, discharge, dysuria, genital sores, hematuria, incontinence, nocturia, urinary frequency, and urinary hesitancy. MS:  Complains of joint pain. Derm:  Denies changes in color of skin, changes in nail beds, dryness, excessive perspiration, flushing, hair loss, insect bite(s), itching, lesion(s), poor wound healing, and rash. Neuro:  Denies brief paralysis, difficulty with concentration, disturbances in coordination, falling down, headaches, inability to speak, memory loss, numbness, poor balance, seizures, sensation of room spinning,  tingling, tremors, visual disturbances, and weakness. Psych:  Complains of anxiety.  Physical Exam  General:  Well-developed,well-nourished,in no acute distress; alert,appropriate and cooperative throughout examination Head:  Normocephalic and atraumatic without  obvious abnormalities. No apparent alopecia or balding. Eyes:  No corneal or conjunctival inflammation noted. EOMI. Perrla. Funduscopic exam benign, without hemorrhages, exudates or papilledema. Vision grossly normal. Ears:  decreased hearing otherwise negative Nose:  External nasal examination shows no deformity or inflammation. Nasal mucosa are pink and moist without lesions or exudates. Mouth:  Oral mucosa and oropharynx without lesions or exudates.  Teeth in good repair. Neck:  No deformities, masses, or tenderness noted. Chest Wall:  No deformities, masses, or tenderness noted. Breasts:  right mastectomy with reconstruction left breast negative Lungs:  Normal respiratory effort, chest expands symmetrically. Lungs are clear to auscultation, no crackles or wheezes. Heart:  Normal rate and regular rhythm. S1 and S2 normal without gallop, murmur, click, rub or other extra sounds. Abdomen:  Bowel sounds positive,abdomen soft and non-tender without masses, organomegaly or hernias noted. Rectal:  No external abnormalities noted. Normal sphincter tone. No rectal masses or tenderness. Genitalia:  absent uterus Msk:  No deformity or scoliosis noted of thoracic or lumbar spine.   Pulses:  R and L carotid,radial,femoral,dorsalis pedis and posterior tibial pulses are full and equal bilaterally Extremities:  No clubbing, cyanosis, edema, or deformity noted with normal full range of motion of all joints.   Neurologic:  No cranial nerve deficits noted. Station and gait are normal. Plantar reflexes are down-going bilaterally. DTRs are symmetrical throughout. Sensory, motor and coordinative functions appear intact. Skin:  Intact without suspicious lesions or rashes Cervical Nodes:  No lymphadenopathy noted Axillary Nodes:  No palpable lymphadenopathy Inguinal Nodes:  No significant adenopathy Psych:  Cognition and judgment appear intact. Alert and cooperative with normal attention span and concentration. No  apparent delusions, illusions, hallucinations   Impression & Recommendations:  Problem # 1:  DECREASED HEARING (ICD-389.9) Assessment Unchanged  Problem # 2:  PREVENTIVE HEALTH CARE (ICD-V70.0) Assessment: Unchanged  Problem # 3:  FIBROMYALGIA (ICD-729.1) Assessment: Unchanged  The following medications were removed from the medication list:    Meloxicam 15 Mg Tabs (Meloxicam) .Marland Kitchen... 1 tab once daily for arthritis Her updated medication list for this problem includes:    Adult Aspirin Ec Low Strength 81 Mg Tbec (Aspirin) .Marland Kitchen... 1 once daily    Naprelan 500 Mg Xr24h-tab (Naproxen sodium) .Marland Kitchen... 2 tabs once daily after a meal savella samples  Problem # 4:  HYPERTENSION (ICD-401.9) Assessment: Improved  Her updated medication list for this problem includes:    Lotrel 10-20 Mg Caps (Amlodipine besy-benazepril hcl) ..... Once daily for bp  Problem # 5:  GERD (ICD-530.81) Assessment: Improved  Her updated medication list for this problem includes:    Nexium 40 Mg Cpdr (Esomeprazole magnesium) .Marland Kitchen... 1 once daily for gerd    Omeprazole 40 Mg Cpdr (Omeprazole) .Marland Kitchen... 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 2 inh three times a day as needed shortness of breathe or wheezing  Problem # 8:  COLONIC POLYPS, HX OF (ICD-V12.72) Assessment: Unchanged  Problem # 9:  HYPERLIPIDEMIA (ICD-272.4) Assessment: New  Problem # 10:  HYPOTHYROIDISM (ICD-244.9)  Her updated medication list for this problem includes:  Synthroid 150 Mcg Tabs (Levothyroxine sodium) .Marland Kitchen... 1  each  day  Orders: Prescription Created Electronically [id number])  Complete Medication List: 1)  Lorazepam 2 Mg Tabs (Lorazepam) .Marland Kitchen.. 1 tab three times a day as needed 2)  Nexium 40 Mg  Cpdr (Esomeprazole magnesium) .Marland Kitchen.. 1 once daily for gerd 3)  Adult Aspirin Ec Low Strength 81 Mg Tbec (Aspirin) .Marland Kitchen.. 1 once daily 4)  Lotrel 10-20 Mg Caps (Amlodipine besy-benazepril hcl) .... Once daily for bp 5)  Amitriptyline Hcl 25 Mg Tabs (Amitriptyline hcl) .... 2 at bedtime 6)  Femara 2.5 Mg Tabs (Letrozole) .... Dr Donnie Coffin 7)  Omeprazole 40 Mg Cpdr (Omeprazole) .Marland Kitchen.. 1 two times a day for gerd 8)  Synthroid 150 Mcg Tabs (Levothyroxine sodium) .Marland Kitchen.. 1  each  day 9)  Naprelan 500 Mg Xr24h-tab (Naproxen sodium) .... 2 tabs once daily after a meal  Patient Instructions: 1)  I think you are ding fine, continue that good positive attitude. 2)  Hypothyroidism , take Synthroid 150 micrograms grams each day 3)  Arthritis Napre.aqn 1.0 gm or 2 50 mg tabs per day 4)  take omeprazole 20mg  2 AM and 2 PM( take up your Nexium) 5)  continue your regular medications 6)  good luck on the draining breast 7)  You  did well on smoking cessation, I wish more of my patients would follow after you 8)  Blood pressure doing fine 9)  Schedule Lipidsd and TSH in 3 months   Prescriptions: NAPRELAN 500 MG XR24H-TAB (NAPROXEN SODIUM) 2 tabs once daily after a meal  #60 x 11   Entered by:   Pura Spice, RN   Authorized by:   Judithann Sheen MD   Signed by:   Pura Spice, RN on 10/13/2009   Method used:   Electronically to        [facility]  [phone]* (retail)       [address]       [place], [place]  [address]       Ph: [phone] or [phone]       Fax: [phone]   RxID:   [id number] AMITRIPTYLINE HCL 25 MG TABS (AMITRIPTYLINE HCL) 2 at bedtime  #60 x 11   Entered and Authorized by:   Judithann Sheen MD   Signed by:   Judithann Sheen MD on 09/20/2009   Method used:   Electronically to        [facility]  [phone]* (retail)       [address]       [place], [place]  [address]       Ph: [phone] or  [phone]       Fax: [phone]   RxID:   [id number] LOTREL 10-20 MG  CAPS (AMLODIPINE BESY-BENAZEPRIL HCL) once daily for BP  #30 x 11   Entered and Authorized by:   Judithann Sheen MD   Signed by:   Judithann Sheen MD on 09/20/2009   Method used:   Electronically to        [facility]  [phone]* (retail)       [address]       [place], [place]  [address]       Ph: [phone] or [phone]       Fax:  [phone]   RxID:   [id number] OMEPRAZOLE 40 MG CPDR (OMEPRAZOLE) 1 two times a day for gerd  #60 x 11   Entered and Authorized by:   Judithann Sheen MD   Signed by:   Judithann Sheen MD on 09/20/2009   Method used:   Electronically to        [facility]  [phone]* (retail)       [address]       [place], [place]  [address]       Ph: [phone] or [phone]       Fax: [phone]   RxID:   [id number] LORAZEPAM 2 MG  TABS (LORAZEPAM) 1 tab three times a day as needed  #90 x 5   Entered and Authorized by:   Judithann Sheen MD   Signed by:   Judithann Sheen MD on 09/20/2009   Method used:   Print then Give to Patient   RxID:   [id number]   Preventive Care Screening  Pap Smear:    Date:  09/09/2007    Next Due:  09/2009    Results:  normal

## 2010-11-14 NOTE — Assessment & Plan Note (Signed)
Summary: cpx/mhf   Vital Signs:  Patient Profile:   65 Years Old Female Height:     65.5 inches Weight:      141 pounds O2 Sat:      98 % Temp:     97.4 degrees F Pulse rate:   76 / minute BP sitting:   136 / 80  (left arm) Cuff size:   regular  Vitals Entered By: Pura Spice, RN (September 14, 2008 9:26 AM)                 Chief Complaint:  cpx  .  History of Present Illness: Pt in for yearly evaluation, cpx doing fine except continues to smoke and needs symbicort complains rt earache to schedule mammogram colonoscopic due 2011 pap 2008    Current Allergies (reviewed today): ! SULFA  Past Medical History:    migraine headache    Diverticulitis, hx of    Colonic polyps, hx of    COPD    GERD    Hypertension    fibromyalgia        PAP  last pap 2008  hx hystrectomy     MAMMOGRAM  "pt stated will sch "    EYE EXAM    EKG  2007    DEXA-BONE DENSITY  per pt waiting til gets on medicare due to insurance     PNEUMONIA VACCINE     SHINGLES VACCINE    DT   2009    Colonscopy   2006 repeat 2011    SMOKER  ppd x 45 yrs              Review of Systems      See HPI  General      Denies chills, fatigue, fever, loss of appetite, malaise, sleep disorder, sweats, weakness, and weight loss.  Eyes      Denies blurring, discharge, double vision, eye irritation, eye pain, halos, itching, light sensitivity, red eye, vision loss-1 eye, and vision loss-both eyes.  ENT      Complains of earache.  CV      Denies bluish discoloration of lips or nails, chest pain or discomfort, difficulty breathing at night, difficulty breathing while lying down, fainting, fatigue, leg cramps with exertion, lightheadness, near fainting, palpitations, shortness of breath with exertion, swelling of feet, swelling of hands, and weight gain.  Resp      See HPI      Complains of shortness of breath.  GI      Denies abdominal pain, bloody stools, change in bowel habits, constipation,  dark tarry stools, diarrhea, excessive appetite, gas, hemorrhoids, indigestion, loss of appetite, nausea, vomiting, vomiting blood, and yellowish skin color.  GU      Denies abnormal vaginal bleeding, decreased libido, discharge, dysuria, genital sores, hematuria, incontinence, nocturia, urinary frequency, and urinary hesitancy.  MS      Denies joint pain, joint redness, joint swelling, loss of strength, low back pain, mid back pain, muscle aches, muscle , cramps, muscle weakness, stiffness, and thoracic pain.  Derm      Denies changes in color of skin, changes in nail beds, dryness, excessive perspiration, flushing, hair loss, insect bite(s), itching, lesion(s), poor wound healing, and rash.  Neuro      Denies brief paralysis, difficulty with concentration, disturbances in coordination, falling down, headaches, inability to speak, memory loss, numbness, poor balance, seizures, sensation of room spinning, tingling, tremors, visual disturbances, and weakness.  Psych  Denies alternate hallucination ( auditory/visual), anxiety, depression, easily angered, easily tearful, irritability, mental problems, panic attacks, sense of great danger, suicidal thoughts/plans, thoughts of violence, unusual visions or sounds, and thoughts /plans of harming others.  Endo      Denies cold intolerance, excessive hunger, excessive thirst, excessive urination, heat intolerance, polyuria, and weight change.   Physical Exam  General:     Well-developed,well-nourished,in no acute distress; alert,appropriate and cooperative throughout examination Head:     Normocephalic and atraumatic without obvious abnormalities. No apparent alopecia or balding. Eyes:     No corneal or conjunctival inflammation noted. EOMI. Perrla. Funduscopic exam benign, without hemorrhages, exudates or papilledema. Vision grossly normal. Ears:     rt TM erythematous DECREASED HEARING BILATERALLY Nose:     nasal congestion Mouth:      Oral mucosa and oropharynx without lesions or exudates.  Teeth in good repair. Neck:     No deformities, masses, or tenderness noted. Chest Wall:     No deformities, masses, or tenderness noted. Breasts:     No mass, nodules, thickening, tenderness, bulging, retraction, inflamation, nipple discharge or skin changes noted.   Lungs:     decreased  breathe sounds, exp wheezes Heart:     Normal rate and regular rhythm. S1 and S2 normal without gallop, murmur, click, rub or other extra sounds. Abdomen:     Bowel sounds positive,abdomen soft and non-tender without masses, organomegaly or hernias noted. Rectal:     NENE Msk:     No deformity or scoliosis noted of thoracic or lumbar spine.   Pulses:     R and L carotid,radial,femoral,dorsalis pedis and posterior tibial pulses are full and equal bilaterally Extremities:     No clubbing, cyanosis, edema, or deformity noted with normal full range of motion of all joints.   Neurologic:     No cranial nerve deficits noted. Station and gait are normal. Plantar reflexes are down-going bilaterally. DTRs are symmetrical throughout. Sensory, motor and coordinative functions appear intact. Skin:     Intact without suspicious lesions or rashes Cervical Nodes:     No lymphadenopathy noted Axillary Nodes:     No palpable lymphadenopathy Inguinal Nodes:     No significant adenopathy Psych:     Cognition and judgment appear intact. Alert and cooperative with normal attention span and concentration. No apparent delusions, illusions, hallucinations    Impression & Recommendations:  Problem # 1:  PREVENTIVE HEALTH CARE (ICD-V70.0) Assessment: Unchanged  Problem # 2:  OTITIS MEDIA, RIGHT (ICD-382.9) Assessment: Deteriorated  Her updated medication list for this problem includes:    Meloxicam 15 Mg Tabs (Meloxicam) .Marland Kitchen... 1 tab once daily for arthritis    Adult Aspirin Ec Low Strength 81 Mg Tbec (Aspirin) .Marland Kitchen... 1 once daily    Ciprofloxacin Hcl 500  Mg Tabs (Ciprofloxacin hcl) .Marland Kitchen... 1 two times a day for sinusitis and ear infection   Problem # 3:  FIBROMYALGIA (ICD-729.1) Assessment: Unchanged  Her updated medication list for this problem includes:    Meloxicam 15 Mg Tabs (Meloxicam) .Marland Kitchen... 1 tab once daily for arthritis    Adult Aspirin Ec Low Strength 81 Mg Tbec (Aspirin) .Marland Kitchen... 1 once daily   Problem # 4:  HYPERTENSION (ICD-401.9) Assessment: Improved  Her updated medication list for this problem includes:    Lotrel 10-20 Mg Caps (Amlodipine besy-benazepril hcl) ..... Once daily for bp   Problem # 5:  GERD (ICD-530.81) Assessment: Improved  Her updated medication list for this problem includes:  Nexium 40 Mg Cpdr (Esomeprazole magnesium) .Marland Kitchen... 1 once daily for gerd   Problem # 6:  COPD (ICD-496) Assessment: Unchanged  The following medications were removed from the medication list:    Advair Diskus 250-50 Mcg/dose Misc (Fluticasone-salmeterol) .Marland Kitchen... 1 puff two times a day    Symbicort 80-4.5 Mcg/act Aero (Budesonide-formoterol fumarate) .Marland Kitchen... 2 inhalations two times a day instead of advair  Her updated medication list for this problem includes:    Proair Hfa 108 (90 Base) Mcg/act Aers (Albuterol sulfate) .Marland Kitchen... 2 inh three times a day as needed shortness of breathe or wheezing   Problem # 7:  DECREASED HEARING (ICD-389.9) Assessment: New  Complete Medication List: 1)  Meloxicam 15 Mg Tabs (Meloxicam) .Marland Kitchen.. 1 tab once daily for arthritis 2)  Lorazepam 2 Mg Tabs (Lorazepam) .Marland Kitchen.. 1 tab three times a day as needed 3)  Estradiol 1 Mg Tabs (Estradiol) .Marland Kitchen.. 1 tab once daily 4)  Nexium 40 Mg Cpdr (Esomeprazole magnesium) .Marland Kitchen.. 1 once daily for gerd 5)  Adult Aspirin Ec Low Strength 81 Mg Tbec (Aspirin) .Marland Kitchen.. 1 once daily 6)  Proair Hfa 108 (90 Base) Mcg/act Aers (Albuterol sulfate) .... 2 inh three times a day as needed shortness of breathe or wheezing 7)  Claritin 10 Mg Tabs (Loratadine) .Marland Kitchen.. 1 by mouth once daily as  needed 8)  Lotrel 10-20 Mg Caps (Amlodipine besy-benazepril hcl) .... Once daily for bp 9)  Ciprofloxacin Hcl 500 Mg Tabs (Ciprofloxacin hcl) .Marland Kitchen.. 1 two times a day for sinusitis and ear infection 10)  Lyrica 50 Mg Caps (Pregabalin) .Marland Kitchen.. 1 three times a day for fibromyalgia 11)  Mucinex Dm Maximum Strength 60-1200 Mg Xr12h-tab (Dextromethorphan-guaifenesin) .... Otc 12)  Amitriptyline Hcl 25 Mg Tabs (Amitriptyline hcl) .... 2 at bedtime   Patient Instructions: 1)  take medications as prescribed 2)  Dr. Ermalinda Barrios is an [facility] 3)  recommend referal to evaluate deaFNESS   Prescriptions: LORAZEPAM 2 MG  TABS (LORAZEPAM) 1 tab three times a day as needed  #90 x 5   Entered and Authorized by:   Judithann Sheen MD   Signed by:   Judithann Sheen MD on 09/14/2008   Method used:   Print then Give to Patient   RxID:   [id number] AMITRIPTYLINE HCL 25 MG TABS (AMITRIPTYLINE HCL) 2 at bedtime  #60 x 11   Entered and Authorized by:   Judithann Sheen MD   Signed by:   Judithann Sheen MD on 09/14/2008   Method used:   Electronically to        [facility]  [phone]* (retail)       [address]       [place], [place]  [address]       Ph: [phone] or [phone]       Fax: [phone]   RxID:   [id number] CIPROFLOXACIN HCL 500 MG TABS (CIPROFLOXACIN HCL) 1 two times a day for sinusitis and ear infection  #30 x 2   Entered and Authorized by:   Judithann Sheen MD   Signed by:   Judithann Sheen MD on 09/14/2008   Method used:   Electronically to        [facility]  [phone]* (retail)       [address]       [place]  [place], [place]  [address]       Ph: [phone] or [phone]       Fax: [phone]   RxID:   [id number] LOTREL 10-20 MG  CAPS (AMLODIPINE BESY-BENAZEPRIL HCL) once daily for BP  #30 x 11   Entered and Authorized by:   Judithann Sheen MD   Signed  by:   Judithann Sheen MD on 09/14/2008   Method used:   Electronically to        [facility]  [phone]* (retail)       [address]       [place], [place]  [address]       Ph: [phone] or [phone]       Fax: [phone]   RxID:   [id number] PROAIR HFA 108 (90 BASE) MCG/ACT  AERS (ALBUTEROL SULFATE) 2 inh three times a day as needed shortness of breathe or wheezing  #1 x 11   Entered and Authorized by:   Judithann Sheen MD   Signed by:   Judithann Sheen MD on 09/14/2008   Method used:   Electronically to        [facility]  [phone]* (retail)       [address]       [place], [place]  [address]       Ph: [phone] or [phone]       Fax: [phone]   RxID:   [id number] NEXIUM 40 MG  CPDR (ESOMEPRAZOLE MAGNESIUM) 1 once daily for gerd  #30 x 11   Entered and Authorized by:   Judithann Sheen MD   Signed by:   Judithann Sheen MD on 09/14/2008   Method used:   Electronically to        [facility]  [phone]* (retail)       [address]       [place], [place]  [address]       Ph: [phone] or [phone]       Fax: [phone]   RxID:   [id number] ESTRADIOL 1 MG  TABS (ESTRADIOL) 1 tab once daily  #30 x 11   Entered and Authorized by:   Judithann Sheen MD   Signed by:   Judithann Sheen MD on 09/14/2008   Method used:   Electronically to        [facility]  [phone]* (retail)       [address]       [place], [place]  [address]       Ph: [phone] or [phone]       Fax: [phone]   RxID:   [id number] MELOXICAM 15 MG  TABS (MELOXICAM) 1 tab once daily for arthritis  #30 x 11   Entered and Authorized by:   Judithann Sheen MD   Signed by:   Judithann Sheen MD on 09/14/2008   Method used:   Electronically to        [facility]  [phone]*  (retail)       [address]       [place], [place]  [address]       Ph: [phone] or [phone]  Fax: [phone]   RxID:   Karli.Mass  ]  Tetanus/Td Immunization History:    Tetanus/Td # 1:  Tdap (10/16/2007)

## 2010-11-14 NOTE — Assessment & Plan Note (Signed)
Summary: rash on body/njr   Vital Signs:  Patient profile:   65 year old female Weight:      161 pounds O2 Sat:      97 % Temp:     98.1 degrees F Pulse rate:   97 / minute BP sitting:   140 / 88  (left arm)  Vitals Entered By: Pura Spice, RN (November 09, 2009 10:48 AM) CC: rash arms legs using gold bond  Is Patient Diabetic? No   History of Present Illness: this 65 year old white female with satisfactory coverage from breast cancer and has had memory reconstruction was doing well until approximately 4-6 weeks ago when she began having generalized purpuric rash, nonvesicular. Fibromyalgia referral will control GERD controlled Hypertension stable  Allergies: 1)  ! Sulfa  Past History:  Past Surgical History: Last updated: 09/20/2009 Hysterectomy Colectomy Appendectomy mastectomy with reconstructive surgery  Past Medical History: migraine headache Diverticulitis, hx of Colonic polyps, hx of COPD GERD Hypertension fibromyalgia  PAP  last pap 2008  hx hystrectomy  MAMMOGRAM  "pt stated will sch " EYE EXAM EKG  2007 DEXA-BONE DENSITY  per pt waiting til gets on medicare due to insurance  PNEUMONIA VACCINE  SHINGLES VACCINE DT   2009 Colonscopy   2006 repeat 2011 SMOKER  ppd x 45 yrs  breast cancer  Review of Systems  The patient denies anorexia, fever, weight loss, weight gain, vision loss, decreased hearing, hoarseness, chest pain, syncope, dyspnea on exertion, peripheral edema, prolonged cough, headaches, hemoptysis, abdominal pain, melena, hematochezia, severe indigestion/heartburn, hematuria, incontinence, genital sores, muscle weakness, suspicious skin lesions, transient blindness, difficulty walking, depression, unusual weight change, abnormal bleeding, enlarged lymph nodes, angioedema, breast masses, and testicular masses.    Physical Exam  General:  Well-developed,well-nourished,in no acute distress; alert,appropriate and cooperative throughout  examination Head:  Normocephalic and atraumatic without obvious abnormalities. No apparent alopecia or balding. Eyes:  No corneal or conjunctival inflammation noted. EOMI. Perrla. Funduscopic exam benign, without hemorrhages, exudates or papilledema. Vision grossly normal. Ears:  negative except for decreased hearing Nose:  External nasal examination shows no deformity or inflammation. Nasal mucosa are pink and moist without lesions or exudates. Mouth:  Oral mucosa and oropharynx without lesions or exudates.  Teeth in good repair. Chest Wall:  No deformities, masses, or tenderness noted. Breasts:  right breast reconstructed following breasts cancer left breast negative Lungs:  Normal respiratory effort, chest expands symmetrically. Lungs are clear to auscultation, no crackles or wheezes. Heart:  Normal rate and regular rhythm. S1 and S2 normal without gallop, murmur, click, rub or other extra sounds. Skin:  generalized erythematous prudent rash non-vesicular Cervical Nodes:  No lymphadenopathy noted Axillary Nodes:  No palpable lymphadenopathy Inguinal Nodes:  No significant adenopathy Psych:  Cognition and judgment appear intact. Alert and cooperative with normal attention span and concentration. No apparent delusions, illusions, hallucinations   Impression & Recommendations:  Problem # 1:  DERMATITIS, ATOPIC (ICD-691.8) Assessment New  prednisone 20 mg decreasing dosage  Orders: Prescription Created Electronically [phone]  Problem # 2:  HYPERLIPIDEMIA (ICD-272.4) Assessment: Improved  Problem # 3:  ADENOCARCINOMA, BREAST (ICD-174.9) Assessment: Improved right breast with reconstruction  Problem # 4:  DECREASED HEARING (ICD-389.9) Assessment: Unchanged  Problem # 5:  FIBROMYALGIA (ICD-729.1) Assessment: Improved  The following medications were removed from the medication list:    Naprelan 500 Mg Xr24h-tab (Naproxen sodium) .Marland Kitchen... 2 tabs once daily after a meal Her updated  medication list for this problem includes:    Adult  Aspirin Ec Low Strength 81 Mg Tbec (Aspirin) .Marland Kitchen... 1 once daily    Diclofenac Sodium 75 Mg Tbec (Diclofenac sodium) .Marland Kitchen... 1 two times a day pc for arthritis  Problem # 6:  HYPERTENSION (ICD-401.9) Assessment: Improved  Her updated medication list for this problem includes:    Lotrel 10-20 Mg Caps (Amlodipine besy-benazepril hcl) ..... Once daily for bp  Problem # 7:  GERD (ICD-530.81) Assessment: Improved  The following medications were removed from the medication list:    Nexium 40 Mg Cpdr (Esomeprazole magnesium) .Marland Kitchen... 1 once daily for gerd Her updated medication list for this problem includes:    Omeprazole 40 Mg Cpdr (Omeprazole) .Marland Kitchen... 1 two times a day for gerd  Problem # 8:  COPD (ICD-496) Assessment: Improved quit smoking  Complete Medication List: 1)  Lorazepam 2 Mg Tabs (Lorazepam) .Marland Kitchen.. 1 tab three times a day as needed 2)  Adult Aspirin Ec Low Strength 81 Mg Tbec (Aspirin) .Marland Kitchen.. 1 once daily 3)  Lotrel 10-20 Mg Caps (Amlodipine besy-benazepril hcl) .... Once daily for bp 4)  Amitriptyline Hcl 25 Mg Tabs (Amitriptyline hcl) .... 2 at bedtime 5)  Femara 2.5 Mg Tabs (Letrozole) .... Dr Donnie Coffin 6)  Omeprazole 40 Mg Cpdr (Omeprazole) .Marland Kitchen.. 1 two times a day for gerd 7)  Synthroid 150 Mcg Tabs (Levothyroxine sodium) .Marland Kitchen.. 1  each  day 8)  Diclofenac Sodium 75 Mg Tbec (Diclofenac sodium) .Marland Kitchen.. 1 two times a day pc for arthritis 9)  Prednisone 20 Mg Tabs (Prednisone) .Marland Kitchen.. 1 three times a day pc x 4 days then 1 two times a day for 6 days then 1 once daily for rash 10)  Hydroxyzine Hcl 25 Mg Tabs (Hydroxyzine hcl) .Marland Kitchen.. 1 stat then1 morn midaternoon then hs to prevent itching  Other Orders: Depo- Medrol 80mg  (J1040) Depo- Medrol 40mg  (J1030) Admin of Therapeutic Inj  intramuscular or subcutaneous ([address])  Patient Instructions: 1)  diagnosis is a top dermatitis 2)  Treatment will be Depo-Medrol 120 mg IM, prednisone 20 mg decrease  in dosage and hydroxyzine prevent itching\par 3)  Recommend avoid hot baths 4)  After rash he is cured and if it returns making a through and out of all foods and medicines you have ingested Prescriptions: HYDROXYZINE HCL 25 MG TABS (HYDROXYZINE HCL) 1 stat then1 morn midaternoon then hs to prevent itching  #36 x 2   Entered and Authorized by:   Christy Sheen MD   Signed by:   Christy Sheen MD on 11/09/2009   Method used:   Electronically to        [facility]  [phone]* (retail)       [address]       [place], [place]  [address]       Ph: [phone] or [phone]       Fax: [phone]   RxID:   [id number] PREDNISONE 20 MG TABS (PREDNISONE) 1 three times a day pc x 4 days then 1 two times a day for 6 days then 1 once daily for rash  #30 x 1   Entered and Authorized by:   Christy Sheen MD   Signed by:   Christy Sheen MD on 11/09/2009   Method used:   Electronically to        [facility]  [phone]* (retail)       [phone] [address]  [place], [place]  [address]       Ph: [phone] or [phone]       Fax: [phone]   RxID:   [id number] DICLOFENAC SODIUM 75 MG TBEC (DICLOFENAC SODIUM) 1 two times a day pc for arthritis  #60 x 11   Entered and Authorized by:   Christy Sheen MD   Signed by:   Christy Sheen MD on 11/09/2009   Method used:   Electronically to        [facility]  [phone]* (retail)       [address]       [place], [place]  [address]       Ph: [phone] or [phone]       Fax: [phone]   RxID:   [id number]    Medication Administration  Injection # 1:    Medication: Depo- Medrol 80mg     Diagnosis: SKIN RASH (ICD-782.1)    Route: IM    Site: RUOQ gluteus    Exp Date: 07/2010    Lot #: [id number]    Mfr: teva    Patient tolerated injection without complications    Given by: Pura Spice, RN (November 09, 2009 11:36 AM)  Injection # 2:    Medication: Depo- Medrol 40mg     Diagnosis: SKIN RASH (ICD-782.1)    Route: IM    Site: RUOQ gluteus    Exp Date: 07/2010    Lot #: [id number]    Mfr: teva    Patient tolerated injection without complications    Given by: Pura Spice, RN (November 09, 2009 11:37 AM)  Orders Added: 1)  Depo- Medrol 80mg  [J1040] 2)  Depo- Medrol 40mg  [J1030] 3)  Admin of Therapeutic Inj  intramuscular or subcutaneous [96372] 4)  Prescription Created Electronically [G8553] 5)  Est. Patient Level IV [15[address]]

## 2010-11-14 NOTE — Letter (Signed)
Summary: [facility]  [facility]   Imported By: Maryln Gottron 06/23/2009 14:24:24  _____________________________________________________________________  External Attachment:    Type:   Image     Comment:   External Document

## 2010-11-14 NOTE — Letter (Signed)
Summary: [facility]  [facility]   Imported By: Maryln Gottron 04/21/2009 10:54:02  _____________________________________________________________________  External Attachment:    Type:   Image     Comment:   External Document

## 2010-11-14 NOTE — Letter (Signed)
Summary: [facility]  [facility]   Imported By: Maryln Gottron 10/19/2009 15:43:49  _____________________________________________________________________  External Attachment:    Type:   Image     Comment:   External Document

## 2010-11-14 NOTE — Letter (Signed)
Summary: [facility]  [facility]   Imported By: Maryln Gottron 02/28/2009 09:16:44  _____________________________________________________________________  External Attachment:    Type:   Image     Comment:   External Document

## 2010-11-14 NOTE — Letter (Signed)
Summary: [facility]  [facility]   Imported By: Maryln Gottron 12/20/2008 14:09:45  _____________________________________________________________________  External Attachment:    Type:   Image     Comment:   External Document

## 2010-11-14 NOTE — Letter (Signed)
Summary: Results Follow-up Letter  [place] at [facility]  [address] [place], [place] [address]   Phone: [phone]  Fax: [phone]    09/28/2009  [address] [place], [place]  [address]  Dear Ms. ROTHBAUER,   The following are the results of your recent test(s):  Test     Result     Pap Smear    Normal yes _____   Sincerely,  DR Gwenyth Bender [place],MD      [place] at [facility]

## 2010-11-14 NOTE — Letter (Signed)
Summary: Generic Letter  [place] at [facility]  [address] [place], [place] [address]   Phone: [phone]  Fax: [phone]    10/01/2007  [facility] [address] [place], [place]  [address]  Dear Ms. LOGIUDICE,  Pap Smear was negative.         Sincerely,   Almira Coaster, Editor, commissioning at [employer]

## 2010-11-14 NOTE — Assessment & Plan Note (Signed)
Summary: 3 month rov/njr   Vital Signs:  Patient profile:   65 year old female Weight:      162 pounds BMI:     27.06 O2 Sat:      97 % Temp:     97.8 degrees F Pulse rate:   94 / minute BP sitting:   140 / 86  (left arm) Cuff size:   regular  Vitals Entered By: Pura Spice, RN (December 20, 2009 1:02 PM) CC: 3 month follow up and wants to discuss labs  Is Patient Diabetic? No   History of Present Illness: this 65 year old white female with a history of breast cancer and mastectomy was seen in the past recently for atopic dermatitis as well as finding out that she had hypothyroidism and was started on Synthroid. She is in today for followup regarding these problems as well as to evaluate her COPD. Clydie Braun with the oncologist has been along well and the patient has been feeling much better and feels well at this time. She continues to have arthritis which is controlled with diclofenac anxiety and depression are under good control Blood pressure is under good control She has had fibromyalgia in the past but is not as much of a problem at this time  Allergies: 1)  ! Sulfa  Past History:  Past Medical History: Last updated: 11/09/2009 migraine headache Diverticulitis, hx of Colonic polyps, hx of COPD GERD Hypertension fibromyalgia  PAP  last pap 2008  hx hystrectomy  MAMMOGRAM  "pt stated will sch " EYE EXAM EKG  2007 DEXA-BONE DENSITY  per pt waiting til gets on medicare due to insurance  PNEUMONIA VACCINE  SHINGLES VACCINE DT   2009 Colonscopy   2006 repeat 2011 SMOKER  ppd x 45 yrs  breast cancer  Past Surgical History: Last updated: 09/20/2009 Hysterectomy Colectomy Appendectomy mastectomy with reconstructive surgery  Risk Factors: Smoking Status: quit > 6 months (09/20/2009) Packs/Day: 1.0 (09/20/2009)  Review of Systems  The patient denies anorexia, fever, weight loss, weight gain, vision loss, decreased hearing, hoarseness, chest pain, syncope, dyspnea  on exertion, peripheral edema, prolonged cough, headaches, hemoptysis, abdominal pain, melena, hematochezia, severe indigestion/heartburn, hematuria, incontinence, genital sores, muscle weakness, suspicious skin lesions, transient blindness, difficulty walking, depression, unusual weight change, abnormal bleeding, enlarged lymph nodes, angioedema, breast masses, and testicular masses.    Physical Exam  General:  Well-developed,well-nourished,in no acute distress; alert,appropriate and cooperative throughout examination Ears:  decreased hearing bilaterally Neck:  No deformities, masses, or tenderness noted. Chest Wall:  No deformities, masses, or tenderness noted. Breasts:  mastectomy with mammoplasty her left appears and doing well Lungs:  Normal respiratory effort, chest expands symmetrically. Lungs are clear to auscultation, no crackles or wheezes. Heart:  Normal rate and regular rhythm. S1 and S2 normal without gallop, murmur, click, rub or other extra sounds. Abdomen:  Bowel sounds positive,abdomen soft and non-tender without masses, organomegaly or hernias noted. Msk:  No deformity or scoliosis noted of thoracic or lumbar spine.   Pulses:  R and L carotid,radial,femoral,dorsalis pedis and posterior tibial pulses are full and equal bilaterally Extremities:  No clubbing, cyanosis, edema, or deformity noted with normal full range of motion of all joints.   Skin:  Intact without suspicious lesions or rashes rashes much improved   Impression & Recommendations:  Problem # 1:  HYPOTHYROIDISM (ICD-244.9) Assessment Improved  Her updated medication list for this problem includes:    Synthroid 150 Mcg Tabs (Levothyroxine sodium) .Marland Kitchen... 1  each  day  Orders: Prescription Created Electronically [phone])  Problem # 2:  DERMATITIS, ATOPIC (ICD-691.8) Assessment: Improved  Problem # 3:  HYPERLIPIDEMIA (ICD-272.4) Assessment: Improved  Problem # 4:  ADENOCARCINOMA, BREAST  (ICD-174.9) Assessment: Unchanged  Problem # 5:  DECREASED HEARING (ICD-389.9) Assessment: Improved  Problem # 6:  FIBROMYALGIA (ICD-729.1) Assessment: Improved  Her updated medication list for this problem includes:    Adult Aspirin Ec Low Strength 81 Mg Tbec (Aspirin) .Marland Kitchen... 1 once daily    Diclofenac Sodium 75 Mg Tbec (Diclofenac sodium) .Marland Kitchen... 1 two times a day pc for arthritis  Complete Medication List: 1)  Lorazepam 2 Mg Tabs (Lorazepam) .Marland Kitchen.. 1 tab three times a day as needed 2)  Adult Aspirin Ec Low Strength 81 Mg Tbec (Aspirin) .Marland Kitchen.. 1 once daily 3)  Lotrel 10-20 Mg Caps (Amlodipine besy-benazepril hcl) .... Once daily for bp 4)  Amitriptyline Hcl 25 Mg Tabs (Amitriptyline hcl) .... 2 at bedtime 5)  Omeprazole 40 Mg Cpdr (Omeprazole) .Marland Kitchen.. 1 two times a day for gerd 6)  Synthroid 150 Mcg Tabs (Levothyroxine sodium) .Marland Kitchen.. 1  each  day 7)  Diclofenac Sodium 75 Mg Tbec (Diclofenac sodium) .Marland Kitchen.. 1 two times a day pc for arthritis 8)  Prednisone 20 Mg Tabs (Prednisone) .Marland Kitchen.. 1 three times a day pc x 4 days then 1 two times a day for 6 days then 1 once daily for rash 9)  Hydroxyzine Hcl 25 Mg Tabs (Hydroxyzine hcl) .Marland Kitchen.. 1 stat then1 morn midaternoon then hs to prevent itching  Patient Instructions: 1)  Pleased in change in throid level, decrease dosage synthroiid to 2)  125 mg each day 3)  cut prednisone to half dosage, 10 mg 4)  do not be discouraged about weight now because prednisone has entered into picture 5)  Lipids are good, thyroid helped 6)  call in  4 weeks as to your weight 7)  schedule TSH in 2 months, will call you the results

## 2010-11-14 NOTE — Letter (Signed)
Summary: [facility]  [facility]   Imported By: Maryln Gottron 02/28/2009 09:25:36  _____________________________________________________________________  External Attachment:    Type:   Image     Comment:   External Document

## 2010-11-14 NOTE — Letter (Signed)
Summary: [facility]  [facility]   Imported By: Maryln Gottron 05/25/2009 10:43:22  _____________________________________________________________________  External Attachment:    Type:   Image     Comment:   External Document

## 2010-11-14 NOTE — Letter (Signed)
Summary: [facility]  [facility]   Imported By: Maryln Gottron 01/07/2009 10:32:04  _____________________________________________________________________  External Attachment:    Type:   Image     Comment:   External Document  Appended Document: [facility] reviewed

## 2010-11-14 NOTE — Progress Notes (Signed)
Summary: ? appt  Phone Note Call from Patient   Caller: Patient Call For: Dr. Scotty Court Summary of Call: Pt. calls stating that she had a laceration of her finger, and while in ER, her BP was running 197/102.  She was told to contact Dr. Scotty Court and make an appt. [phone] Initial call taken by: Lynann Beaver CMA,  May 24, 2008 11:03 AM  Follow-up for Phone Call        I can see her tomrrow Follow-up by: Nelwyn Salisbury MD,  May 24, 2008 1:10 PM  Additional Follow-up for Phone Call Additional follow up Details #1::        scheduled appt tomorrow. Additional Follow-up by: Lynann Beaver CMA,  May 24, 2008 1:23 PM

## 2010-11-16 NOTE — Assessment & Plan Note (Signed)
Summary: CPX // RS   Vital Signs:  Patient profile:   65 year old female Menstrual status:  hysterectomy Height:      65 inches Weight:      138 pounds BMI:     23.05 Pulse rate:   90 / minute Pulse rhythm:   regular BP sitting:   112 / 68  (left arm)  Vitals Entered By: Kyung Rudd, CMA (November 09, 2010 10:07 AM) CC: CPX.Marland KitchenMarland Kitchenpt also wants to discuss quitting smoking again     Menstrual Status hysterectomy Last PAP Result NEGATIVE FOR INTRAEPITHELIAL LESIONS OR MALIGNANCY.   History of Present Illness: catheter 65 year-old white married female with history of wrist cancer, and the care of Dr. Donnie Coffin oncologist history of COPD GERD and hypertension in the past has had fibromyalgia but not having any signs and symptoms at this time Placement of decreased hearing and had bilateral hearing aids Arthritis has increased in severity since breast cancer She is in the head increase symptoms of GERD not really with omeprazole once per day for now will increase to omeprazole 40 mg b.i.d. Have restarted smoking and desires to her restart Chantix  Preventive Screening-Counseling & Management  Alcohol-Tobacco     Smoking Status: current  Current Medications (verified): 1)  Lorazepam 2 Mg  Tabs (Lorazepam) .Marland Kitchen.. 1 Tab Three Times A Day As Needed 2)  Adult Aspirin Ec Low Strength 81 Mg  Tbec (Aspirin) .Marland Kitchen.. 1 Once Daily 3)  Lotrel 10-20 Mg  Caps (Amlodipine Besy-Benazepril Hcl) .... Once Daily For Bp 4)  Amitriptyline Hcl 25 Mg Tabs (Amitriptyline Hcl) .... 2 At Bedtime 5)  Omeprazole 40 Mg Cpdr (Omeprazole) .Marland Kitchen.. 1 Two Times A Day For Gerd 6)  Synthroid 150 Mcg Tabs (Levothyroxine Sodium) .Marland Kitchen.. 1  Each  Day 7)  Diclofenac Sodium 75 Mg Tbec (Diclofenac Sodium) .Marland Kitchen.. 1 Two Times A Day Pc For Arthritis 8)  Prednisone 20 Mg Tabs (Prednisone) .Marland Kitchen.. 1 Three Times A Day Pc X 4 Days Then 1 Two Times A Day For 6 Days Then 1 Once Daily For Rash 9)  Hydroxyzine Hcl 25 Mg Tabs (Hydroxyzine Hcl) .Marland Kitchen.. 1  Stat Then1 Morn Midaternoon Then Hs To Prevent Itching 10)  Triamterene-Hctz 75-50 Mg Tabs (Triamterene-Hctz) .Marland Kitchen.. 1 By Mouth Once Daily  Allergies (verified): 1)  ! Sulfa  Past History:  Past Medical History: Last updated: 11/09/2009 migraine headache Diverticulitis, hx of Colonic polyps, hx of COPD GERD Hypertension fibromyalgia  PAP  last pap 2008  hx hystrectomy  MAMMOGRAM  "pt stated will sch " EYE EXAM EKG  2007 DEXA-BONE DENSITY  per pt waiting til gets on medicare due to insurance  PNEUMONIA VACCINE  SHINGLES VACCINE DT   2009 Colonscopy   2006 repeat 2011 SMOKER  ppd x 45 yrs  breast cancer  Past Surgical History: Last updated: 09/20/2009 Hysterectomy Colectomy Appendectomy mastectomy with reconstructive surgery  Social History: Last updated: 11/09/2010 Current Smoker  Risk Factors: Smoking Status: current (11/09/2010) Packs/Day: 1.0 (09/20/2009)  Social History: Current Smoker Smoking Status:  current  Review of Systems      See HPI  Physical Exam  General:  Well-developed,well-nourished,in no acute distress; alert,appropriate and cooperative throughout examination Head:  Normocephalic and atraumatic without obvious abnormalities. No apparent alopecia or balding. Eyes:  No corneal or conjunctival inflammation noted. EOMI. Perrla. Funduscopic exam benign, without hemorrhages, exudates or papilledema. Vision grossly normal. Ears:  negative exam exception of hearing aids, normal decreased hearing Nose:  External nasal examination shows no  deformity or inflammation. Nasal mucosa are pink and moist without lesions or exudates. Mouth:  Oral mucosa and oropharynx without lesions or exudates.  Teeth in good repair. Neck:  No deformities, masses, or tenderness noted. Chest Wall:  No deformities, masses, or tenderness noted. Breasts:  mastectomy right breast with mammoplasty Lungs:  bronchial breathing bilaterally no rales no bolus to percussion Heart:   Normal rate and regular rhythm. S1 and S2 normal without gallop, murmur, click, rub or other extra sounds. Abdomen:  Bowel sounds positive,abdomen soft and non-tender without masses, organomegaly or hernias noted. Rectal:  No external abnormalities noted. Normal sphincter tone. No rectal masses or tenderness. Genitalia:  vagina moist absent uterus Msk:  No deformity or scoliosis noted of thoracic or lumbar spine.   Pulses:  R and L carotid,radial,femoral,dorsalis pedis and posterior tibial pulses are full and equal bilaterally Extremities:  No clubbing, cyanosis, edema, or deformity noted with normal full range of motion of all joints.   Neurologic:  No cranial nerve deficits noted. Station and gait are normal. Plantar reflexes are down-going bilaterally. DTRs are symmetrical throughout. Sensory, motor and coordinative functions appear intact. Skin:  Intact without suspicious lesions or rashes Cervical Nodes:  No lymphadenopathy noted Axillary Nodes:  No palpable lymphadenopathy Inguinal Nodes:  No significant adenopathy Psych:  Cognition and judgment appear intact. Alert and cooperative with normal attention span and concentration. No apparent delusions, illusions, hallucinations   Impression & Recommendations:  Problem # 1:  SLEEP APNEA (ICD-780.57) Assessment Improved  Problem # 2:  HYPOTHYROIDISM (ICD-244.9) Assessment: Improved  The following medications were removed from the medication list:    Synthroid 125 Mcg Tabs (Levothyroxine sodium) .Marland Kitchen... 1 qd Her updated medication list for this problem includes:    Synthroid 150 Mcg Tabs (Levothyroxine sodium) .Marland Kitchen... 1  each  day    Synthroid 112 Mcg Tabs (Levothyroxine sodium) ..... One q. day  Orders: Specimen Handling (04540) TLB-CBC Platelet - w/Differential (85025-CBCD) TLB-TSH (Thyroid Stimulating Hormone) (84443-TSH)  Problem # 3:  ADENOCARCINOMA, BREAST (ICD-174.9) Assessment: Improved  Orders: TLB-CBC Platelet -  w/Differential (85025-CBCD)  Problem # 4:  HYPERTENSION (ICD-401.9) Assessment: Improved  The following medications were removed from the medication list:    Triamterene-hctz 75-50 Mg Tabs (Triamterene-hctz) .Marland Kitchen... 1 by mouth once daily Her updated medication list for this problem includes:    Lotrel 10-20 Mg Caps (Amlodipine besy-benazepril hcl) ..... Once daily for bp  Orders: TLB-BMP (Basic Metabolic Panel-BMET) (80048-METABOL) Prescription Created Electronically [phone])  Problem # 5:  GERD (ICD-530.81) Assessment: Deteriorated  Her updated medication list for this problem includes:    Omeprazole 40 Mg Cpdr (Omeprazole) .Marland Kitchen... 1 two times a day for gerd  Problem # 6:  ARTHRITIS (ICD-716.90) Assessment: Unchanged  Problem # 7:  COPD (ICD-496) Assessment: Unchanged  Complete Medication List: 1)  Lorazepam 2 Mg Tabs (Lorazepam) .Marland Kitchen.. 1 tab three times a day as needed 2)  Adult Aspirin Ec Low Strength 81 Mg Tbec (Aspirin) .Marland Kitchen.. 1 once daily 3)  Lotrel 10-20 Mg Caps (Amlodipine besy-benazepril hcl) .... Once daily for bp 4)  Amitriptyline Hcl 25 Mg Tabs (Amitriptyline hcl) .... 2 at bedtime 5)  Omeprazole 40 Mg Cpdr (Omeprazole) .Marland Kitchen.. 1 two times a day for gerd 6)  Synthroid 150 Mcg Tabs (Levothyroxine sodium) .Marland Kitchen.. 1  each  day 7)  Diclofenac Sodium 75 Mg Tbec (Diclofenac sodium) .Marland Kitchen.. 1 two times a day pc for arthritis 8)  Chantix Continuing Month Pak 1 Mg Tabs (Varenicline tartrate) .Marland Kitchen.. 1 qd  9)  Synthroid 112 Mcg Tabs (Levothyroxine sodium) .... One q. day  Other Orders: Venipuncture (81191) T-Vitamin D (25-Hydroxy) [phone]) UA Dipstick w/o Micro (manual) (08657) Zoster (Shingles) Vaccine Live (84696) Admin 1st Vaccine (29528) TLB-Lipid Panel (80061-LIPID) TLB-Hepatic/Liver Function Pnl (80076-HEPATIC)  Patient Instructions: 1)  have refilled her medications 2)  Recommend that you increase omeprazole 40 mg A.m. and p.m. 3)  We'll call lab  results Prescriptions: SYNTHROID 112 MCG TABS (LEVOTHYROXINE SODIUM) one q. day  #30 x 11   Entered and Authorized by:   Judithann Sheen MD   Signed by:   Judithann Sheen MD on 11/11/2010   Method used:   Electronically to        [facility]  [phone]* (retail)       [address]       [place], [place]  [address]       Ph: [phone] or [phone]       Fax: [phone]   RxID:   [id number] CHANTIX CONTINUING MONTH PAK 1 MG TABS (VARENICLINE TARTRATE) 1 qd  #30 x 2   Entered and Authorized by:   Judithann Sheen MD   Signed by:   Judithann Sheen MD on 11/09/2010   Method used:   Electronically to        [facility]  [phone]* (retail)       [address]       [place], [place]  [address]       Ph: [phone] or [phone]       Fax: [phone]   RxID:   [id number] DICLOFENAC SODIUM 75 MG TBEC (DICLOFENAC SODIUM) 1 two times a day pc for arthritis  #60 x 11   Entered and Authorized by:   Judithann Sheen MD   Signed by:   Judithann Sheen MD on 11/09/2010   Method used:   Electronically to        [facility]  [phone]* (retail)       [address]       [place], [place]  [address]       Ph: [phone] or [phone]       Fax: [phone]   RxID:   [id number] SYNTHROID 125 MCG TABS (LEVOTHYROXINE SODIUM) 1 qd  #30 x 11   Entered and Authorized by:   Judithann Sheen MD   Signed by:   Judithann Sheen MD on 11/09/2010   Method used:   Electronically to        [facility]  [phone]* (retail)       [address]       [place], [place]  [address]       Ph: [phone] or [phone]       Fax: [phone]   RxID:   [id number] OMEPRAZOLE 40 MG CPDR (OMEPRAZOLE) 1 two times a day for gerd  #60 x 11   Entered and Authorized by:   Judithann Sheen MD   Signed by:   Judithann Sheen MD on 11/09/2010   Method used:   Electronically to        [facility]  [phone]* (retail)       [phone] [facility]  [address]       [place], [place]  [address]       Ph: [phone] or [phone]       Fax: [phone]   RxID:   [id number] AMITRIPTYLINE HCL 25 MG TABS (AMITRIPTYLINE HCL) 2 at bedtime  #60 x 11   Entered and Authorized by:   Judithann Sheen MD   Signed by:   Judithann Sheen MD on 11/09/2010   Method used:   Electronically to        [facility]  [phone]* (retail)       [address]       [place], [place]  [address]       Ph: [phone] or [phone]       Fax: [phone]   RxID:   [id number] LOTREL 10-20 MG  CAPS (AMLODIPINE BESY-BENAZEPRIL HCL) once daily for BP  #30 x 11   Entered and Authorized by:   Judithann Sheen MD   Signed by:   Judithann Sheen MD on 11/09/2010   Method used:   Electronically to        [facility]  [phone]* (retail)       [address]       [place], [place]  [address]       Ph: [phone] or [phone]       Fax: [phone]   RxID:   [id number] LORAZEPAM 2 MG  TABS (LORAZEPAM) 1 tab three times a day as needed  #90 x 5   Entered and Authorized by:   Judithann Sheen MD   Signed by:   Judithann Sheen MD on 11/09/2010   Method used:   Print then Give to Patient   RxID:   [id number]    Orders Added: 1)  Venipuncture [36415] 2)  T-Vitamin D (25-Hydroxy) [phone] 3)  UA Dipstick w/o Micro (manual) [81002] 4)  Specimen Handling [99000] 5)  Zoster (Shingles) Vaccine Live [90736] 6)  Admin 1st Vaccine [90471] 7)  TLB-Lipid Panel [80061-LIPID] 8)  TLB-BMP (Basic Metabolic Panel-BMET) [80048-METABOL] 9)  TLB-CBC Platelet - w/Differential [85025-CBCD] 10)  TLB-Hepatic/Liver Function Pnl [80076-HEPATIC] 11)  TLB-TSH (Thyroid Stimulating Hormone) [84443-TSH] 12)   Prescription Created Electronically [G8553] 13)  Est. Patient Level IV [50932]   Immunizations Administered:  Zostavax # 1:    Vaccine Type: Zostavax    Site: left deltoid    Mfr: Merck    Dose: 0.5 ml    Route: [place]    Given by: Pura Spice, RN    Exp. Date: 02/23/2011    Lot #: [id number]    VIS given: 07/27/05 given November 09, 2010.   Immunizations Administered:  Zostavax # 1:    Vaccine Type: Zostavax    Site: left deltoid    Mfr: Merck    Dose: 0.5 ml    Route: [place]    Given by: Pura Spice, RN    Exp. Date: 02/23/2011    Lot #: [id number]    VIS given: 07/27/05 given November 09, 2010.  Laboratory Results   Urine Tests  Date/Time Recieved: November 09, 2010 2:20 PM  Date/Time Reported: November 09, 2010 2:20 PM   Routine Urinalysis   Color: yellow Appearance: Clear Glucose: negative   (Normal Range: Negative) Bilirubin: negative   (Normal Range: Negative)  Ketone: negative   (Normal Range: Negative) Spec. Gravity: 1.010   (Normal Range: 1.003-1.035) Blood: negative   (Normal Range: Negative) pH: 5.0   (Normal Range: 5.0-8.0) Protein: negative   (Normal Range: Negative) Urobilinogen: 0.2   (Normal Range: 0-1) Nitrite: negative   (Normal Range: Negative) Leukocyte Esterace: trace   (Normal Range: Negative)    Comments: Wynona Canes, CMA  November 09, 2010 2:20 PM

## 2010-11-17 NOTE — Letter (Signed)
Summary: [facility]  [facility]   Imported By: Maryln Gottron 07/14/2009 09:26:19  _____________________________________________________________________  External Attachment:    Type:   Image     Comment:   External Document

## 2010-12-07 ENCOUNTER — Other Ambulatory Visit: Payer: Self-pay | Admitting: Oncology

## 2010-12-07 DIAGNOSIS — C50919 Malignant neoplasm of unspecified site of unspecified female breast: Secondary | ICD-10-CM

## 2010-12-08 ENCOUNTER — Other Ambulatory Visit: Payer: Self-pay | Admitting: Oncology

## 2010-12-08 ENCOUNTER — Encounter (HOSPITAL_BASED_OUTPATIENT_CLINIC_OR_DEPARTMENT_OTHER): Payer: Medicare Other | Admitting: Oncology

## 2010-12-08 ENCOUNTER — Ambulatory Visit (HOSPITAL_COMMUNITY)
Admission: RE | Admit: 2010-12-08 | Discharge: 2010-12-08 | Disposition: A | Discharge status: Home / Self Care | Payer: MEDICARE | Source: Ambulatory Visit | Attending: Oncology | Admitting: Oncology

## 2010-12-08 DIAGNOSIS — Z853 Personal history of malignant neoplasm of breast: Secondary | ICD-10-CM | POA: Insufficient documentation

## 2010-12-08 DIAGNOSIS — R945 Abnormal results of liver function studies: Secondary | ICD-10-CM | POA: Insufficient documentation

## 2010-12-08 DIAGNOSIS — C50919 Malignant neoplasm of unspecified site of unspecified female breast: Secondary | ICD-10-CM

## 2010-12-08 DIAGNOSIS — Z170 Estrogen receptor positive status [ER+]: Secondary | ICD-10-CM

## 2010-12-08 DIAGNOSIS — C50119 Malignant neoplasm of central portion of unspecified female breast: Secondary | ICD-10-CM

## 2010-12-08 DIAGNOSIS — J984 Other disorders of lung: Secondary | ICD-10-CM | POA: Insufficient documentation

## 2010-12-08 DIAGNOSIS — K55059 Acute (reversible) ischemia of intestine, part and extent unspecified: Secondary | ICD-10-CM | POA: Insufficient documentation

## 2010-12-08 LAB — CMP (CANCER CENTER ONLY)
ALT(SGPT): 59 U/L — ABNORMAL HIGH (ref 10–47)
AST: 41 U/L — ABNORMAL HIGH (ref 11–38)
Albumin: 3.6 g/dL (ref 3.3–5.5)
Alkaline Phosphatase: 97 U/L — ABNORMAL HIGH (ref 26–84)
BUN, Bld: 30 mg/dL — ABNORMAL HIGH (ref 7–22)
CO2: 22 mEq/L (ref 18–33)
Calcium: 9.9 mg/dL (ref 8.0–10.3)
Chloride: 101 mEq/L (ref 98–108)
Creat: 1 mg/dl (ref 0.6–1.2)
Glucose, Bld: 99 mg/dL (ref 73–118)
Potassium: 5.2 mEq/L — ABNORMAL HIGH (ref 3.3–4.7)
Sodium: 134 mEq/L (ref 128–145)
Total Bilirubin: 0.6 mg/dl (ref 0.20–1.60)
Total Protein: 7.1 g/dL (ref 6.4–8.1)

## 2010-12-08 LAB — CBC WITH DIFFERENTIAL/PLATELET
BASO%: 0.7 % (ref 0.0–2.0)
Basophils Absolute: 0 10*3/uL (ref 0.0–0.1)
EOS%: 4.4 % (ref 0.0–7.0)
Eosinophils Absolute: 0.3 10*3/uL (ref 0.0–0.5)
HCT: 42.5 % (ref 34.8–46.6)
HGB: 14.6 g/dL (ref 11.6–15.9)
LYMPH%: 26.9 % (ref 14.0–49.7)
MCH: 30.3 pg (ref 25.1–34.0)
MCHC: 34.4 g/dL (ref 31.5–36.0)
MCV: 88.2 fL (ref 79.5–101.0)
MONO#: 0.8 10*3/uL (ref 0.1–0.9)
MONO%: 12.9 % (ref 0.0–14.0)
NEUT#: 3.3 10*3/uL (ref 1.5–6.5)
NEUT%: 55.1 % (ref 38.4–76.8)
Platelets: 383 10*3/uL (ref 145–400)
RBC: 4.82 10*6/uL (ref 3.70–5.45)
RDW: 13.9 % (ref 11.2–14.5)
WBC: 5.9 10*3/uL (ref 3.9–10.3)
lymph#: 1.6 10*3/uL (ref 0.9–3.3)
nRBC: 0 % (ref 0–0)

## 2010-12-08 LAB — VITAMIN D 25 HYDROXY (VIT D DEFICIENCY, FRACTURES): Vit D, 25-Hydroxy: 52 ng/mL (ref 30–89)

## 2010-12-08 LAB — CANCER ANTIGEN 27.29: CA 27.29: 23 U/mL (ref 0–39)

## 2010-12-08 MED ORDER — IOHEXOL 300 MG/ML  SOLN
100.0000 mL | Freq: Once | INTRAMUSCULAR | Status: AC | PRN
  Administered 2010-12-08: 100 mL via INTRAVENOUS

## 2010-12-21 ENCOUNTER — Telehealth: Payer: Self-pay | Admitting: Family Medicine

## 2010-12-21 NOTE — Telephone Encounter (Signed)
Error/njr °

## 2010-12-25 ENCOUNTER — Ambulatory Visit
Admission: RE | Admit: 2010-12-25 | Discharge: 2010-12-25 | Disposition: A | Discharge status: Home / Self Care | Payer: MEDICARE | Source: Ambulatory Visit | Attending: Oncology | Admitting: Oncology

## 2010-12-25 DIAGNOSIS — Z853 Personal history of malignant neoplasm of breast: Secondary | ICD-10-CM

## 2011-01-10 ENCOUNTER — Encounter: Payer: Self-pay | Admitting: Family Medicine

## 2011-01-11 ENCOUNTER — Encounter (HOSPITAL_BASED_OUTPATIENT_CLINIC_OR_DEPARTMENT_OTHER): Payer: Medicare Other | Admitting: Oncology

## 2011-01-11 ENCOUNTER — Other Ambulatory Visit: Payer: Self-pay | Admitting: Oncology

## 2011-01-11 DIAGNOSIS — Z170 Estrogen receptor positive status [ER+]: Secondary | ICD-10-CM

## 2011-01-11 DIAGNOSIS — R110 Nausea: Secondary | ICD-10-CM

## 2011-01-11 DIAGNOSIS — C50119 Malignant neoplasm of central portion of unspecified female breast: Secondary | ICD-10-CM

## 2011-01-11 LAB — COMPREHENSIVE METABOLIC PANEL
ALT: 77 U/L — ABNORMAL HIGH (ref 0–35)
AST: 64 U/L — ABNORMAL HIGH (ref 0–37)
Albumin: 4.7 g/dL (ref 3.5–5.2)
Alkaline Phosphatase: 101 U/L (ref 39–117)
BUN: 17 mg/dL (ref 6–23)
CO2: 20 mEq/L (ref 19–32)
Calcium: 10 mg/dL (ref 8.4–10.5)
Chloride: 103 mEq/L (ref 96–112)
Creatinine, Ser: 0.81 mg/dL (ref 0.40–1.20)
Glucose, Bld: 102 mg/dL — ABNORMAL HIGH (ref 70–99)
Potassium: 5.3 mEq/L (ref 3.5–5.3)
Sodium: 135 mEq/L (ref 135–145)
Total Bilirubin: 0.7 mg/dL (ref 0.3–1.2)
Total Protein: 7.8 g/dL (ref 6.0–8.3)

## 2011-01-11 LAB — CBC WITH DIFFERENTIAL/PLATELET
BASO%: 0.1 % (ref 0.0–2.0)
Basophils Absolute: 0 10*3/uL (ref 0.0–0.1)
EOS%: 3.4 % (ref 0.0–7.0)
Eosinophils Absolute: 0.2 10*3/uL (ref 0.0–0.5)
HCT: 46.8 % — ABNORMAL HIGH (ref 34.8–46.6)
HGB: 15.8 g/dL (ref 11.6–15.9)
LYMPH%: 22.5 % (ref 14.0–49.7)
MCH: 30.9 pg (ref 25.1–34.0)
MCHC: 33.6 g/dL (ref 31.5–36.0)
MCV: 91.9 fL (ref 79.5–101.0)
MONO#: 0.6 10*3/uL (ref 0.1–0.9)
MONO%: 11.2 % (ref 0.0–14.0)
NEUT#: 3.5 10*3/uL (ref 1.5–6.5)
NEUT%: 62.8 % (ref 38.4–76.8)
Platelets: 336 10*3/uL (ref 145–400)
RBC: 5.1 10*6/uL (ref 3.70–5.45)
RDW: 14.8 % — ABNORMAL HIGH (ref 11.2–14.5)
WBC: 5.7 10*3/uL (ref 3.9–10.3)
lymph#: 1.3 10*3/uL (ref 0.9–3.3)

## 2011-01-16 ENCOUNTER — Encounter (HOSPITAL_BASED_OUTPATIENT_CLINIC_OR_DEPARTMENT_OTHER): Payer: MEDICARE | Admitting: Oncology

## 2011-01-16 DIAGNOSIS — C50119 Malignant neoplasm of central portion of unspecified female breast: Secondary | ICD-10-CM

## 2011-01-16 DIAGNOSIS — R110 Nausea: Secondary | ICD-10-CM

## 2011-01-16 DIAGNOSIS — Z170 Estrogen receptor positive status [ER+]: Secondary | ICD-10-CM

## 2011-01-20 LAB — BASIC METABOLIC PANEL
BUN: 11 mg/dL (ref 6–23)
CO2: 24 mEq/L (ref 19–32)
Calcium: 9.1 mg/dL (ref 8.4–10.5)
Chloride: 108 mEq/L (ref 96–112)
Creatinine, Ser: 0.74 mg/dL (ref 0.4–1.2)
GFR calc Af Amer: >60 mL/min (ref >60)
GFR calc non Af Amer: >60 mL/min (ref >60)
Glucose, Bld: 106 mg/dL — ABNORMAL HIGH (ref 70–99)
Potassium: 4.1 mEq/L (ref 3.5–5.1)
Sodium: 139 mEq/L (ref 135–145)

## 2011-01-20 LAB — CBC
HCT: 34.7 % — ABNORMAL LOW (ref 36.0–46.0)
Hemoglobin: 11.8 g/dL — ABNORMAL LOW (ref 12.0–15.0)
MCHC: 34 g/dL (ref 30.0–36.0)
MCV: 94.4 fL (ref 78.0–100.0)
Platelets: 528 10*3/uL — ABNORMAL HIGH (ref 150–400)
RBC: 3.68 MIL/uL — ABNORMAL LOW (ref 3.87–5.11)
RDW: 16.5 % — ABNORMAL HIGH (ref 11.5–15.5)
WBC: 6.1 10*3/uL (ref 4.0–10.5)

## 2011-01-25 LAB — CBC
HCT: 43.4 % (ref 36.0–46.0)
Hemoglobin: 14.9 g/dL (ref 12.0–15.0)
MCHC: 34.3 g/dL (ref 30.0–36.0)
MCV: 95.6 fL (ref 78.0–100.0)
Platelets: 304 10*3/uL (ref 150–400)
RBC: 4.54 MIL/uL (ref 3.87–5.11)
RDW: 15.2 % (ref 11.5–15.5)
WBC: 5.3 10*3/uL (ref 4.0–10.5)

## 2011-01-25 LAB — BASIC METABOLIC PANEL
BUN: 9 mg/dL (ref 6–23)
CO2: 25 mEq/L (ref 19–32)
Calcium: 9.8 mg/dL (ref 8.4–10.5)
Chloride: 108 mEq/L (ref 96–112)
Creatinine, Ser: 0.84 mg/dL (ref 0.4–1.2)
GFR calc Af Amer: >60 mL/min (ref >60)
GFR calc non Af Amer: >60 mL/min (ref >60)
Glucose, Bld: 89 mg/dL (ref 70–99)
Potassium: 4.5 mEq/L (ref 3.5–5.1)
Sodium: 140 mEq/L (ref 135–145)

## 2011-01-29 LAB — COMPREHENSIVE METABOLIC PANEL
ALT: 17 U/L (ref 0–35)
AST: 21 U/L (ref 0–37)
Albumin: 4 g/dL (ref 3.5–5.2)
Alkaline Phosphatase: 65 U/L (ref 39–117)
BUN: 18 mg/dL (ref 6–23)
CO2: 27 mEq/L (ref 19–32)
Calcium: 9.6 mg/dL (ref 8.4–10.5)
Chloride: 106 mEq/L (ref 96–112)
Creatinine, Ser: 1.02 mg/dL (ref 0.4–1.2)
GFR calc Af Amer: >60 mL/min (ref >60)
GFR calc non Af Amer: 55 mL/min — ABNORMAL LOW (ref >60)
Glucose, Bld: 98 mg/dL (ref 70–99)
Potassium: 5.6 mEq/L — ABNORMAL HIGH (ref 3.5–5.1)
Sodium: 140 mEq/L (ref 135–145)
Total Bilirubin: 0.8 mg/dL (ref 0.3–1.2)
Total Protein: 7 g/dL (ref 6.0–8.3)

## 2011-01-29 LAB — DIFFERENTIAL
Basophils Absolute: 0 10*3/uL (ref 0.0–0.1)
Basophils Relative: 1 % (ref 0–1)
Eosinophils Absolute: 0.3 10*3/uL (ref 0.0–0.7)
Eosinophils Relative: 4 % (ref 0–5)
Lymphocytes Relative: 20 % (ref 12–46)
Lymphs Abs: 1.6 10*3/uL (ref 0.7–4.0)
Monocytes Absolute: 0.5 10*3/uL (ref 0.1–1.0)
Monocytes Relative: 7 % (ref 3–12)
Neutro Abs: 5.3 10*3/uL (ref 1.7–7.7)
Neutrophils Relative %: 68 % (ref 43–77)

## 2011-01-29 LAB — CBC
HCT: 44.7 % (ref 36.0–46.0)
Hemoglobin: 15.2 g/dL — ABNORMAL HIGH (ref 12.0–15.0)
MCHC: 34 g/dL (ref 30.0–36.0)
MCV: 93.2 fL (ref 78.0–100.0)
Platelets: 333 10*3/uL (ref 150–400)
RBC: 4.79 MIL/uL (ref 3.87–5.11)
RDW: 14 % (ref 11.5–15.5)
WBC: 7.7 10*3/uL (ref 4.0–10.5)

## 2011-01-29 LAB — CANCER ANTIGEN 27.29: CA 27.29: 24 U/mL (ref 0–39)

## 2011-02-02 ENCOUNTER — Encounter (INDEPENDENT_AMBULATORY_CARE_PROVIDER_SITE_OTHER): Payer: MEDICARE | Admitting: Vascular Surgery

## 2011-02-02 DIAGNOSIS — K551 Chronic vascular disorders of intestine: Secondary | ICD-10-CM

## 2011-02-05 NOTE — Consult Note (Signed)
NEW PATIENT CONSULTATION  KENITA, BINES DOB:  1946/01/31                                       02/02/2011 VHQIO#:[id number]  HISTORY OF PRESENT ILLNESS:  This is 65 year old female who presents with chief complaint of abdominal pain.  Apparently she previously has a history of increased LFTs.  Workup for both the pain and LFTs led to a CT scan with contrast.  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  At this point the patient notes a history consistent with food fear.  She also has intermittently pain in her abdomen regardless of whether or not she eats.  She denies any bloody bowel movements or any melena at this point.  She has actually been able to gain weight rather than lose weight.  At this point her atherosclerotic risk factors include hypertension and hyperlipidemia.  Her atherosclerotic risk factor management includes aspirin and has not been placed on a statin due to her LFT elevations.  PAST MEDICAL HISTORY: 1. Breast cancer.  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 2. Hypertension. 3. Osteoporosis. 4. Diverticulitis. 5. Hyperlipidemia. 6. Gastroesophageal reflux disease. 7. Fibromyalgia. 8. Emphysema.  PAST SURGICAL HISTORY: 1. Lumpectomy with sentinel lymph node biopsy.  Simple mastectomy.     She then also had a right latissimus flap reconstruction of the     breast and then  placement of a tissue expander and placement of an     artificial breast. 2. Right upper lobe wedge resection as part of a VAT. 3. Multiple polypectomies during colonoscopy. 4. Total vaginal hysterectomy. 5. Sigmoid colectomy.  SOCIAL HISTORY:  Smoking with greater than 40-pack-year history.  She also has a known history of drinking 4-5 beers a day.  Denies any illicit drug use.  FAMILY HISTORY:  Father died of a MI.  Mother died of cancer.  MEDICATIONS:  Ativan, Nexium, aspirin, Lotrel, amitriptyline,  Prilosec, Synthroid, naproxen.  ALLERGIES:  Sulfa.  REVIEW OF SYSTEMS:  Today, she notes blackouts, pain in legs with walking.  Pain in feet with lying flat.  Blood clot in her vein after VAT.  Arthritis joint pain and muscle pain.  She has had reflux, hiatal hernia and some type of a hives problem.  Otherwise the rest of review of system was noted to be negative.  PHYSICAL EXAMINATION:  Today she had a blood pressure 131/83, heart rate 71, respirations were 12. General:  She appears well-developed, well-nourished.  Does not appear cachectic. Head:  Normocephalic, atraumatic. ENT:  Oropharynx with erythema without any exudate.  Nares without any drainage.  The hearing was grossly intact. Neck:  Supple neck with no nuchal rigidity.  No JVD. Eyes:  Pupils were equal, round, reactive to light.  Extraocular movements were intact. Pulmonary:  She had symmetric expansion, good air movement.  No rales, rhonchi or wheezing. Cardiac:  Regular rate and rhythm.  Normal S1-S2.  No murmurs, rubs, thrills or gallops. Vascular:  She had easily palpable pulses throughout.  The carotids were palpable without any bruits.  Abdominal aorta could not be palpated. Abdomen:  She had a midline incision consistent with the colectomy. Soft abdomen.  Nontender, nondistended.  No guarding, no rebound, no hepatosplenomegaly.  Musculoskeletal:  She had 5/5 strength throughout. No signs of any ischemic changes throughout.  No gangrene. Neuro:  Cranial nerves II-XII  were intact.  Motor strength was as listed above.  Sensation was grossly intact throughout. Psych:  Judgment appeared to be intact.  Mood and affect were appropriate for her clinical situation. Skin:  The extremities were as listed above.  She has in her left leg some ecchymoses from a recent vein procedure.  Also the right lower quadrant of her abdomen, there was an area of ecchymoses. Lymphatic:  There was no cervical, axillary or inguinal  lymphadenopathy.  I reviewed about 20 pages of outside documentation and also reviewed her CT of her abdomen and pelvis.  It demonstrates about a 4-cm length of occlusion in her SMA; however, the distal branches of the SMA do fill likely through collaterals.  Additionally, I do not see an IMA which was likely ligated with a sigmoid colectomy.  Also the celiac appears to be patent, but there appears to be possibly a degree of stenosis in the proximal area which is quite focal.  MEDICAL DECISION MAKING:  This is a 65 year old female who has undergone previous colectomy and a total vaginal hysterectomy and multiple polypectomies.  There is no evidence of an IMA that it is patent here. Her SMA remains perfused likely through collaterals via her celiac.  Her celiac artery also may be potentially compromised, even though symptomatically she does not fit classic chronic mesenteric ischemia, she has enough elements to suggest such and there is evidence on the CT to make me concerned that the she does indeed have chronic mesenteric ischemia and would be at risk for possible development eventually of acute mesenteric ischemia.  I recommend that we proceed forward with a mesenteric angiogram and based on those findings possibly intervene on the celiac.  It is somewhat controversial whether or not stenting the celiac artery is recommended, as the presence of the median arcuate ligament is felt by some experts to potentially compromise the long-term patency of any stents placed at this location.  We discussed extensively the risk of this procedure including access complications, possible renal failure related to the contrast dye, possible rupture of any treated vessels, possible dissection of any vessels, possible embolization, possible need for emergent surgical intervention to salvage any complications and possible inability to treat the visualized pathology.  She is aware of these risks and has  agreed tentatively to proceed forward.  We are going to be scheduled for this coming Thursday, and based on those findings will decide what the next step will be.  Thank you once again for giving [place] the opportunity to participate in this patient's care.    Fransisco Hertz, MD Electronically Signed  BLC/MEDQ  D:  02/02/2011  T:  02/05/2011  Job:  [id number]  cc:   Pierce Crane, M.D., F.R.C.P.C.

## 2011-02-08 ENCOUNTER — Ambulatory Visit (HOSPITAL_COMMUNITY)
Admission: RE | Admit: 2011-02-08 | Discharge: 2011-02-08 | Disposition: A | Discharge status: Home / Self Care | Payer: Medicare Other | Source: Ambulatory Visit | Attending: Vascular Surgery | Admitting: Vascular Surgery

## 2011-02-08 DIAGNOSIS — K551 Chronic vascular disorders of intestine: Secondary | ICD-10-CM

## 2011-02-08 LAB — POCT I-STAT, CHEM 8
BUN: 16 mg/dL (ref 6–23)
Calcium, Ion: 1.03 mmol/L — ABNORMAL LOW (ref 1.12–1.32)
Chloride: 113 mEq/L — ABNORMAL HIGH (ref 96–112)
Creatinine, Ser: 0.8 mg/dL (ref 0.4–1.2)
Glucose, Bld: 81 mg/dL (ref 70–99)
HCT: 48 % — ABNORMAL HIGH (ref 36.0–46.0)
Hemoglobin: 16.3 g/dL — ABNORMAL HIGH (ref 12.0–15.0)
Potassium: 4.5 mEq/L (ref 3.5–5.1)
Sodium: 141 mEq/L (ref 135–145)
TCO2: 20 mmol/L (ref 0–100)

## 2011-02-08 LAB — POCT ACTIVATED CLOTTING TIME
Activated Clotting Time: 193 seconds
Activated Clotting Time: 152 seconds

## 2011-02-11 NOTE — Op Note (Signed)
NAMEMEGYN, LENG NO.:  [id number]  MEDICAL RECORD NO.:  [id number]           PATIENT TYPE:  O  LOCATION:  [facility]                         FACILITY:  [facility]  PHYSICIAN:  Fransisco Hertz, MD       DATE OF BIRTH:  1946-10-15  DATE OF PROCEDURE:  02/08/2011 DATE OF DISCHARGE:  02/08/2011                              OPERATIVE REPORT   PROCEDURE: 1. A right common femoral artery cannulation under ultrasound     guidance. 2. Aortogram. 3. Attempted at left iliac angiogram.  PREOPERATIVE DIAGNOSIS:  Chronic mesenteric ischemia.  POSTOPERATIVE DIAGNOSIS:  Chronic mesenteric ischemia.  SURGEON:  Arlys John L. Imogene Burn, MD  ANESTHESIA:  Conscious sedation.  ESTIMATED BLOOD LOSS:  Minimal.  CONTRAST:  100 mL.  SPECIMENS:  None.  Findings included: 1. Patent aorta with segments of calcification. 2. Patent bilateral renal arteries. 3. Right renal artery with two co-dominant renal arteries, the second     originates from lower in the aorta. 4. There is an occluded SMA and celiac arteries. 5. The celiac artery reconstitutes in a delayed fashion.  There is     calcification evident in the proximal celiac arteries. 6. Long segment SMA occlusion with reconstitution distally via a     hypertrophied meandering mesenteric artery. 7. Attempts to dissect through the chronically occluded celiac artery     were unsuccessful.  INDICATIONS:  This is a 65 year old female who presents with food fear and possible hepatic lesions that was found on CTA of the abdomen to be have an occluded SMA and an celiac artery stenosis.  She was seen in the office and it was felt that while she did not have classical chronic mesenteric ischemia though she had symptomatology that were consistent possibly of chronic mesenteric ischemia and a concerning CT scan.  We discussed the risks of this procedure including bleeding, infection, possible access complications, possible embolization of any treated  vessels, possible rupture of any vessels, and possible need for additional procedures.  She agreed to proceed forward with such.  DESCRIPTION OF THE PROCEDURE:  After full informed written consent was obtained from the patient, she was brought back to angio suite and placed supine upon the angio table.  Prior to induction, she had received conscious sedation, the amounts of which are documented in our chart.  She was then prepped and draped in standard fashion where an aortogram and possible iliac artery intervention.  I first turned my attention to the right groin.  Under ultrasound guidance, I cannulated the right common femoral artery with a 18-gauge needle and passed the wire up into the aorta without any difficulties.  The wire was exchanged for a 6- [place] sheath which was loaded into the common femoral artery.  The dilator was removed and then an Omni flush catheter was loaded over the wire and advanced to the level about T12.  I first shot an anterior-posterior aortogram, the findings of which are listed above.  Then, I placed the patient in 90-degrees lateral position and then shot a lateral aortogram.  This demonstrated the findings that were listed above. Based  on these findings, I felt an attempt to intervene was indicated as I did not clearly see significant celiac artery blood flow.  At this point, the catheter was then removed.  I placed a IM guide over the wire at about the level of L1, and using this catheter I was actually able to easily engage the celiac artery stump and then passed a Spartacore up through this wire.  Please note that I did give the patient  5000 units of heparin immediately prior to attempting to recanalize the  celiac artery to avoid embolizing any thrombus into the distal celiac  artery branches. I used this to try to dissect into the celiac artery;   however, in  this process, immediately it became evident that the lumen was occluded and then the  dissection was going into a subintimal plane.  Given the presence of retrograde flow on the images, I did not feel that it would be prudent to proceed as if I developed a substantial dissection, I could in fact compromise the retrograde filling of this celiac artery.  Subsequently, I stopped this attempt to recanalize the iliac artery.  At this point, I reconstituted the tip of this guide and then pulled out the guide and wire, aspirated the sheath, and then I instilled heparinized saline.  The plan is to pull this catheter in the holding.    COMPLICATIONS:  None.  CONDITION:  Stable.     Fransisco Hertz, MD     BLC/MEDQ  D:  02/08/2011  T:  02/09/2011  Job:  [id number]  Electronically Signed by Leonides Sake MD on 02/11/2011 07:00:08 PM

## 2011-02-16 ENCOUNTER — Ambulatory Visit (INDEPENDENT_AMBULATORY_CARE_PROVIDER_SITE_OTHER): Payer: Medicare Other | Admitting: Vascular Surgery

## 2011-02-16 DIAGNOSIS — K551 Chronic vascular disorders of intestine: Secondary | ICD-10-CM

## 2011-02-19 ENCOUNTER — Encounter: Payer: Self-pay | Admitting: Vascular Surgery

## 2011-02-19 NOTE — Assessment & Plan Note (Signed)
OFFICE VISIT  [name], [name] DOB:  [birth year]                                       02/16/2011 QVZDG#:[id number]  HISTORY OF PRESENT ILLNESS:  This is a 65 year old female who is now status post an aortogram and attempt at a left celiac angiogram.  Based on her imaging this confirmed the diagnosis of chronic mesenteric ischemia.  Currently her bowels are being perfused through a single vessel, her IMA, through a meandering mesenteric which perfuses her SMA and presumably through duodenal branches perfuses the celiac axis. Unfortunately the celiac axis did not perfuse to any extent on the angiogram.  This patient continues to  have abdominal pain and is in the process of being seen by a gastroenterologist for hepatic lesions which may be related to hypoperfusion.  Her past medical history, past surgical history, family history, review of systems, medications, allergies are all completely unchanged, as I have recently seen her.  PHYSICAL EXAMINATION:  Vital signs:  Blood pressure 136/76, heart rate of 80, respirations of 12, temperature 97.6.  General:  Well-developed, well-nourished, no apparent distress.  HEENT:  Pupils were equal, round, reactive to light.  Extraocular movements were intact.  Conjunctivae were normal.  Pulmonary:  Lungs were clear to auscultation bilaterally. No rales, rhonchi or wheezing.  Heart:  Regular rate and rhythm.  Normal S1 and S2.  No murmurs, rubs, thrills or gallops.  Vascular:  Neither carotid had any bruits.  He has palpable pulses in all extremities. Abdomen:  Soft abdomen nontender, nondistended.  No guarding, no rebound, no hepatosplenomegaly.  Musculoskeletal:  Strength was 5/5 throughout with no gangrenous or ischemic changes in any of her extremities.  Neurologic:  She had no focal weakness or paresthesias. Skin:  No ulcers or rashes.  Lymphatics:  No cervical, axillary, or inguinal lymphadenopathy.  MEDICAL  DECISION MAKING:  This is a 65 year old female with now evidence for chronic mesenteric ischemia.  She only has one vessel that is perfusing all of her vessels.  Her symptomatology is consistent with chronic mesenteric ischemia.  Given the associated coronary artery disease with mesenteric ischemia, I would proceed forward first with preoperative cardiac risk stratification and optimization as needed. She is going to be set up to see a cardiologist of [place], we will get her seen as soon as possible.  Tentatively I am going to schedule her revascularizations on Mar 14, 2011.  This will be done in conjunction with Dr. Darrick Penna, who has extensive training in regards to the aorto- mesenteric bypasses from his days at [facility].  We discussed briefly the risks of this procedure which included almost a 5-10% mortality rate in the major series, almost of 30% complication rate and risk of bleeding, infection, MI, stroke, possible death, possible bowel injury due to previous surgery and possible need for extensive lysis of adhesions and possible need for additional procedures in the future.  As I alluded to, she previously has been operated on in the abdomen for a sigmoid colectomy so there is a possibility that extensive lysis of adhesions will be necessary so recruitment of a general surgeon may be necessary.    Fransisco Hertz, MD Electronically Signed  BLC/MEDQ  D:  02/16/2011  T:  02/19/2011  Job:  [id number]

## 2011-02-26 ENCOUNTER — Other Ambulatory Visit: Payer: Self-pay | Admitting: Vascular Surgery

## 2011-02-27 NOTE — Discharge Summary (Signed)
[name], HARVELL NO.:  [id number]   MEDICAL RECORD NO.:  [id number]          PATIENT TYPE:  OIB   LOCATION:  5114                         FACILITY:  [facility]   PHYSICIAN:  Etter Sjogren, M.D.     DATE OF BIRTH:  18-Oct-1945   DATE OF ADMISSION:  12/28/2008  DATE OF DISCHARGE:  12/29/2008                               DISCHARGE SUMMARY   FINAL DIAGNOSIS:  Right breast cancer.   PROCEDURES:  Right total mastectomy, right breast reconstruction with  tissue expander.   SUMMARY OF HISTORY AND PHYSICAL:  A 65 year old  with right breast  cancer.  She has had previous lumpectomy and a sentinel lymph node and  presents at this time for a mastectomy for margins and was interested in  breast reconstruction, and she selected to use staged procedures, tissue  expander followed by implant placement.  These procedures were discussed  with her in great detail and she understood the risks and wished to  proceed.  She is also a smoker and she understood she was at significant  additional risk because of the smoking and the delayed reconstruction  was offered to her; however, she declined and referred to me for  reconstruction.   COURSE IN THE HOSPITAL:  On admission, she was taken to the operating  room at which time the mastectomy was performed and reconstruction with  tissue expander.  She tolerated the procedure well.  Postoperatively,  she did well, remained afebrile with excellent pain control.  Chest,  soft, no hematoma.  Mastectomy flaps, superior looks very good, inferior  capillary refill is 2 seconds, slightly dusky, but fine for now.  I have  encouraged her to avoid smoking and to use her spirometer at home.  She  would like to go home and she is ready to be discharged.   DISPOSITION:  Regular diet.  No lifting.  No vigorous activity.  No  showering.  Empty the drains 3 times a day and record, and I will follow  up next week for drain evaluation.   Discharged on  Percocet,5 mg, a total of 30 were given, 1 p.o. q.4-6 h.  p.r.n. for pain; Robaxin 500 mg p.o. q.12 h.; and Keflex 4 times a day.      Etter Sjogren, M.D.  Electronically Signed     DB/MEDQ  D:  12/29/2008  T:  12/29/2008  Job:  [id number]

## 2011-02-27 NOTE — Discharge Summary (Signed)
[name], [name] NO.:  [id number]   MEDICAL RECORD NO.:  [id number]          PATIENT TYPE:  OIB   LOCATION:  5150                         FACILITY:  [facility]   PHYSICIAN:  Etter Sjogren, M.D.     DATE OF BIRTH:  Oct 16, 1945   DATE OF ADMISSION:  05/30/2009  DATE OF DISCHARGE:  06/02/2009                               DISCHARGE SUMMARY   FINAL DIAGNOSES:  Right breast cancer with spontaneous drainage from  inferior mastectomy flap.   PROCEDURE PERFORMED:  1. Latissimus flap breast reconstruction right chest.  2. Removal of tissue expander and placement of saline implant.   HISTORY OF PRESENT ILLNESS:  This 65 year old woman has had breast  reconstruction with tissue expander following a mastectomy.  She did  well postoperatively and underwent chemotherapy but at 5 months  postoperatively, she began to develop problems with the inferior  mastectomy flap.  She had had a little bit of epidermal lysis in the  postoperative time frame, first few weeks, but that had resolved.  Gradually, this area became little bit thin and erythematous at 4-5  months postoperatively and recently this had become a little bit worse.  She had a little bit of a seroma that had collected there.  A drainage  procedure had been performed but had not resolved this issue.  She was  scheduled for latissimus flap as a salvage procedure and developed a  spontaneous drainage through the site of the day prior to the scheduled  flap.  She was placed on antibiotics and less than 24 hours later,  she  underwent what  hopefully will prove to be definitive procedure on this  admission.  For further details of history physical please see chart.   COURSE IN HOSPITAL:  On admission, her white blood cell count was 6.1,  there was no evidence of infection.  The flap had been marked in the  office.  This was converted to a fleur de lis design with an inferior  extension in order to take care of this area  that was on the inferior  mastectomy flap.  She was taken to the operative room and the procedure  was performed, removal of expander, transfer of latissimus flap,  placement of the implant and she tolerated that well.  Postoperatively,  she did very well.  She made afebrile, drainage was appropriate color.  No hematoma and no vascular compromise of the flap and no vascular  compromise at the donor site on her back.  Everything appeared to be  very healthy.  She was ambulatory, tolerating a regular diet and on the  third postoperative day, it was felt that she was ready to be  discharged.   DISPOSITION:  She is discharged on regular diet.  She is instructed to  avoid any reaching up, pulling, tugging, lifting or exercising.  No  shower yet.  Empty the drain 3 times a day and record the amounts.   She will be discharged on Keflex 500 grams p.o. q.i.d., Dilaudid 2 mg 1  p.o. q.4 hours p.r.n. for pain, Robaxin 500 mg  p.o. q.12 h.   She will return the office in 6 days for recheck, sooner if there are  any questions or concerns.      Etter Sjogren, M.D.  Electronically Signed     DB/MEDQ  D:  06/02/2009  T:  06/02/2009  Job:  [id number]

## 2011-02-27 NOTE — Op Note (Signed)
[name], EXTON NO.:  [id number]   MEDICAL RECORD NO.:  [id number]          PATIENT TYPE:  OIB   LOCATION:  5114                         FACILITY:  [facility]   PHYSICIAN:  Clovis Pu. 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   PROCEDURE:  Right simple mastectomy.   SURGEON:  Maisie Fus A. Cornett, MD   ASSISTANTS:  1. Allison L. Rennis Harding, nurse practitioner.  2. Lazaro Arms, physician assistant.   ANESTHESIA:  LMA.   ESTIMATED BLOOD LOSS:  30 mL.   SPECIMEN:  Right breast.   DRAINS:  None.   INDICATIONS FOR PROCEDURES:  The patient is a 65 year old female with  stage I right breast cancer.  She has multifocal DCIS and after 2  attempts at local resection of this, we could not obtain adequate  margin.  After discussion about options which include re-excision versus  simple mastectomy, she wished to proceed with simple mastectomy.  She  will also undergo reconstruction today by Etter Sjogren after I have  done.   DESCRIPTION OF PROCEDURE:  The patient brought to the operating room and  placed supine.  The upper chest and neck regions were prepped and draped  in a sterile fashion.  A curvilinear incision was made above and below  the nipple-areolar complex to include the previous lumpectomy site.  Superior and inferior skin flaps were then raised using electrocautery.  Superior skin flap was taken to the clavicle, inferior skin flap was  taken down to the inferior mammary fold.  This was taken all the way to  the midline as well.  The breast was then excised in a medial-to-lateral  fashion until we encountered the lateral border of the pectoralis minor  muscle.  We  also identified the latissimus muscle.  At this point, we  amputated the breast from the chest wall and passed it off the field.  Of note, the skin was viable with good bleeding edges.  We left the  muscle belly and the pectoralis major intact.  Irrigation  was used and suctioned out.  At this portion of case, Dr. Odis Luster took  over for his portion of reconstruction.  Please see his operative notes  for details and closure of this wound.  All final counts at this point  in time were correct as I left the case.      Thomas A. Cornett, M.D.  Electronically Signed     TAC/MEDQ  D:  12/28/2008  T:  12/29/2008  Job:  [id number]   cc:   Pierce Crane, MD

## 2011-02-27 NOTE — Op Note (Signed)
[name], [name] NO.:  [id number]   MEDICAL RECORD NO.:  [id number]          PATIENT TYPE:  OIB   LOCATION:  5150                         FACILITY:  [facility]   PHYSICIAN:  Etter Sjogren, M.D.     DATE OF BIRTH:  24-Jun-1946   DATE OF PROCEDURE:  05/30/2009  DATE OF DISCHARGE:                               OPERATIVE REPORT   PREOPERATIVE DIAGNOSIS:  Right breast cancer with skin loss, right  inferior mastectomy flap.   POSTOPERATIVE DIAGNOSIS:  Right breast cancer with skin loss, right  inferior mastectomy flap.   PROCEDURES PERFORMED:  1. Removal of the right tissue expander.  2. Right latissimus dorsi myocutaneous flap.  3. Placement of a saline implant for right breast reconstruction.   SURGEON:  Etter Sjogren, MD   ASSISTANT:  Loreta Ave, MD   ANESTHESIA:  General.   ESTIMATED BLOOD LOSS:  125 mL.   DRAINS:  Four 19-French drains, two back, two anterior chest.   CLINICAL NOTE:  A 65 year old woman who has had breast cancer and had  reconstruction with a tissue expander.  She was a smoker until the time  of the mastectomy.  She did develop some epidermal lysis over her  inferior mastectomy flap, this resolved.  She subsequently underwent  chemotherapy and unfortunately few weeks ago developed an area of  impending skin breakdown in the inferior pole of the right chest area.  The fluid was removed.  The tissue expander was released, and she did  well for several weeks and then returned with similar situation what  appeared to be a seroma.  [facility]  Aspiration was performed, but this area persisted.  She was seen back in  the office, and it was felt that the tissue expander should be removed  and that this area of impending skin loss should be excised and a  latissimus flap used to salvage her reconstruction.  This was discussed  with her at length.  She understood the nature of the procedure and  risks including  but not limited to bleeding, infection, anesthesia  complications, healing problems, scarring, loss of sensation, donor  defect, loss of shoulder function, loss of muscle function with  latissimus, chronic fluid collections, failure of the implant, capsular  contracture, displacement of the implant and loss of the flap,  pneumothorax, pulmonary embolism, and asymmetry as well as overall  disappointment.  She understood all this and wished to proceed.  She  understood that she would be smaller on the right and that she would  need something done on the left possibly some breast reduction or  possibly later a little bit larger implant on the right anything upon  her desires.  She wished to proceed.  In addition, it was discussed with  her that a fleur-de-lis type of skin designed might be necessary because  of this area of skin compromise located on the inferior mastectomy flap  about the mid aspect that is several centimeters below the mastectomy  scar.  She understood that it would give her a T-type of  scar on her  back.   DESCRIPTION OF PROCEDURE:  The patient was marked in the office the day  prior to the surgery.  She unfortunately had developed a rupture on that  day the day prior to the surgery, this was a very small pinhole, but it  did allow for fluid to escape.  A dry sterile dressing was placed, and  we used the antibiotic ointment, Xeroform gauze, and Betadine on the  skin actually and she was dressed sterilely in anticipation of the  surgery today.  The flap also was marked at that time.  She was taken to  the operating room and placed supine.  After successful general  anesthesia, she was placed in a left lateral decubitus position with an  axillary roll and the beanbag inflated, prepped with Betadine and draped  with sterile drapes.  The anterior chest was addressed first.  The old  mastectomy scar was opened and this area inferiorly was excised as am  extension in an  inferior direction.  This tissue was removed.  The  underlying tissue expander was also removed, and there was a little bit  of fibrinous material around the expander and that was removed from the  cavity.  There was no evidence of any infection and the fluid all  appeared to be clear and clean.  Moist lap was placed in tissues of  breast and the back.   The incision made, this included the inferior extension off of the flap  in order to try to correct this area, it was located in the inferior  mastectomy flap.  The dissection was carried down through the  subcutaneous tissue beveling superiorly and inferiorly in order to  capture as much soft tissue as possible with the flap and maintained as  much vascular supply to the skin paddle as possible.  The underlying  latissimus muscle was identified and was dissected out to the margins  superiorly, posteriorly, inferiorly, and anteriorly.  The dissection  once again being carried deep to the muscle taking great care to avoid  damage to the pedicle and perforating vessels from posterior were double  ligated with Ligaclips and divided.  The muscle was then divided sharply  using scissors inferiorly and the cautery was used to cauterize the  remaining stump of the muscle inferiorly.  The flap was then elevated in  that superior direction mobilizing it and the connection was then made  through a subcutaneous tunnel through to the anterior chest and the flap  passed through to the chest and secured with staples.  The back was  irrigated thoroughly with saline.  Meticulous hemostasis with the  electrocautery.  A 19-French drains were then placed, brought through  separate stab wounds anteroinferiorly and secured with 3-0 Prolene  sutures.  The skin closure with deep 2-0 PDS sutures followed by some 2-  0 Monocryl interrupted inverted deep dermal sutures followed by a few 3-  0 Prolene simple interrupted sutures.  A dry sterile dressing applied   and covered with a sterile Tegaderm.  The anterior chest also covered  with sterile Tegaderm.  The patient was then placed supine and the  sterile Tegaderm was removed from the anterior chest, which was then  prepped with Betadine and draped with sterile drapes.  After changing  gloves and gowns, the underlying space was inspected.  Capsule was  removed from the posterior surface of the pectoralis muscle and from the  serratus anterior muscle.  Latissimus muscle was  then placed in between  these for good muscle cover and was secured inferiorly to the  surrounding muscles using 3-0 Vicryl interrupted figure-of-eight  sutures.  Skin paddle had excellent color.  Capillary refill 1-2 seconds  and appeared viable.  After thoroughly cleaning gloves, the implant was  prepared.  It was felt that a 300 mL maximum filling implant was the  largest that could be positioned and it was soaked in antibiotic  solution.  It was 100 mL sterile saline placed using a closed filling  system.  Antibiotic solution placed in the space and allowed to dwell  there and meanwhile 19-French Blake drains were positioned one in the  deep submuscular space where the implant would be located and the other  one under the mastectomy flaps superior and inferior and these were  brought out through separate stab wounds inferiorly and secured with 3-0  Prolene sutures.  The antibiotic solution having been placed in this  chest space and after thoroughly irrigating with saline and again  replacing the antibiotic solution, the implant was positioned.  It was  filled with a maximum of 300 mL and checked to make sure that the skin  can be closed.  That being the case, the filling catheter was removed  and the last few stitches for muscle insetting were placed with 3-0  Vicryl sutures taking great care to avoid damage to underlying implant.  The skin paddle was then inset with 3-0 Monocryl interrupted buried deep  dermal sutures and  a couple of 3-0 Prolene simple interrupted sutures as  needed.  Steri-Strips and a very light dry sterile dressing without  placing a tape on the skin paddle itself was then placed in.  She was  transferred to the recovery room in stable condition having tolerated  the procedure well.      Etter Sjogren, M.D.  Electronically Signed     DB/MEDQ  D:  05/30/2009  T:  05/31/2009  Job:  [id number]

## 2011-02-27 NOTE — Op Note (Signed)
NAMEKRYSTIANNA, [name] NO.:  [id number]   MEDICAL RECORD NO.:  [id number]          PATIENT TYPE:  OIB   LOCATION:  5114                         FACILITY:  [facility]   PHYSICIAN:  Etter Sjogren, M.D.     DATE OF BIRTH:  [birth year]   DATE OF PROCEDURE:  12/28/2008  DATE OF DISCHARGE:                               OPERATIVE REPORT   PREOPERATIVE DIAGNOSIS:  Right breast cancer.   POSTOPERATIVE DIAGNOSIS:  Right breast cancer.   PROCEDURE:  Right breast reconstruction with tissue expander.   SURGEON:  Etter Sjogren, MD   ANESTHESIA:  General.   ESTIMATED BLOOD LOSS:  Minimal.   DRAINS:  There were three 19-French drains left.   CLINICAL NOTE:  A 63-year woman has breast cancer, desired  reconstruction and selected tissue expander followed as a planned stage  procedure eventually by an implant.  The procedure was discussed with  her.  She understood that her smoking history placed her at great risk  for complications including loss of the expander, infection, and loss of  some of the skin on the chest wall but not limited to complications  certainly.  She understood the risks and possible complications  associated with the procedures as well as with her smoking history and  wished to proceed.   DESCRIPTION OF PROCEDURE:  The patient had already been marked for the  placement of tissue expander in the holding area.  The mastectomy was  completed.  The skin flaps appeared to have good color.  The lateral  aspect of the incision was trimmed including a portion of the old  sentinel lymph node biopsy site.  The submuscular space was created  beginning at the lateral border of the pectoralis major muscle extending  down and lifting as an unit the superior portion of the rectus and the  serratus anterior laterally.  This space was made to the dimensions of  the tissue expander.  Great care was taken to avoid damage to underlying  right chest cavity.  Thorough irrigation  with saline and with antibiotic  solution, excellent hemostasis was confirmed.  After thoroughly cleaned  gloves, implant was prepared.  This was an Inamed 133 MV tissue expander  500 mL, lot number [id number].  The air was removed.  A 50 mL of sterile  saline placed using a closed filling system and the expander was soaked  in antibiotic solution for greater than 5 minutes.  The expander was  then positioned.  Care being taken to make sure it was flat, no folds  and wrinkles, and that it was located at the inferior aspect of the  submuscular pocket.  The overlying muscle was then closed with 3-0  Vicryl interrupted figure-of-eight sutures.  Again, irrigation with  saline and antibiotic solution and the Blake drains were positioned and  brought out through separate stab wounds inferolaterally and secured  with 3-0 Prolene sutures.  One of these was placed prior to the  placement of the tissue expander and was actually placed in the  submuscular pocket.  The 3-0 Monocryl inverted deep dermal  sutures and 3-  0 Monocryl running subcuticular  suture completed the closure.  Again, the skin had good capillary refill  at 1-2 seconds up to the very edge of the mastectomy flaps.  Steri-  Strips and a dry sterile dressing lightly applied and an Ace wrap  lightly applied.  She was transferred to the recovery room stable,  having tolerated the procedure well.      Etter Sjogren, M.D.  Electronically Signed     DB/MEDQ  D:  12/28/2008  T:  12/29/2008  Job:  [id number]

## 2011-02-27 NOTE — Op Note (Signed)
NAMEROSELL, KHOURI NO.:  [id number]   MEDICAL RECORD NO.:  [id number]          PATIENT TYPE:  AMB   LOCATION:  DSC                          FACILITY:  [facility]   PHYSICIAN:  Thomas A. Cornett, M.D.DATE OF BIRTH:  [birth year]   DATE OF PROCEDURE:  11/09/2008  DATE OF DISCHARGE:                               OPERATIVE REPORT   PREOPERATIVE DIAGNOSIS:  Stage I right breast cancer.   POSTOPERATIVE DIAGNOSIS:  Stage I right breast cancer.   PROCEDURE:  1. Right breast needle-localized lumpectomy.  2. Right axillary sentinel lymph node mapping with injection of      methylene blue dye.   SURGEON:  Maisie Fus A. Cornett, MD   ANESTHESIA:  LMA 0.25% Sensorcaine local.   ESTIMATED BLOOD LOSS:  10 mL.   SPECIMEN:  1. Right breast mass localizing wire clip verified by x-ray.  2. Two right axillary sentinel lymph nodes, one was blue and hot,      another one was just blue, both negative by touch prep.   DRAINS:  None.   INDICATIONS FOR PROCEDURE:  The patient is a 65 year old female with  roughly a 1.6-cm right breast cancer centrally located.  She presents  for breast-conserving measures since she wished to undergo breast  conserving surgery.  Risks of bleeding and infection were discussed with  the patient as well as alternative therapies of mastectomy.  We also  discussed Radiation Therapy including MammoSite, which she had no  interest in.  She presents today for lumpectomy and sentinel lymph node  mapping on the right.   DESCRIPTION OF PROCEDURE:  The patient was brought to the operating room  and placed supine.  She was injected with technetium sulfur colloid as  indicated in the chart in the holding area.  Wire localization was done  at [employer].  She was brought to the operating room.  After induction  of general anesthesia, alcohol was used to prep and 4 mL of methylene  blue dye were injected in a subareolar position.  Five minutes of  massage ensued.   Right breast was then prepped and draped in sterile  fashion.  The sentinel node was done first.  Neoprobe was used and  appropriate hot spot was identified in the right axilla.  Incision was  made in this area.  Dissection was carried down into the axilla.  We  identified one blue and hot lymph node and a blue lymph node by  examination.  The blue node also had lower counts as well.  We excised  both of these.  These were sent to pathology and were found to be  negative by touch prep.  There are no other significant nodes in the  axilla that I can find by Neoprobe or by visual inspection.   Next, the right breast lumpectomy was done.  Curvilinear incision was  made around the wire which exited the central breast at about 12  o'clock.  This was adjacent to the nipple areolar complex.  The entire  mass was excised.  Radiograph revealed to be intact.  I took  an  additional superior superior deep margin since I thought this was  somewhat close.  The lateral, medial, and inferior margins appeared  grossly negative as well as the posterior margin.  These were  appropriately oriented and sent to pathology.  Radiographs reviewed by  myself showed the clip and wire in place and this was verified by the  radiologist.  We then irrigated out the lumpectomy cavity and closed in  layers.  The deep layer of 3-0 Vicryl and subsequent 4-0 Monocryl stitch  for the skin.  The axilla was examined as well.  A small piece of  Surgicel was placed in the axilla and this was closed with a deep layer  of 3-0 Vicryl and subsequent 4-0 Monocryl.  Dermabond was applied.  All  final counts of sponge, needle, and instruments were found to be correct  this portion of the case.  The patient was then awoke, taken to the  recovery room in satisfactory condition.      Thomas A. Cornett, M.D.  Electronically Signed     TAC/MEDQ  D:  11/09/2008  T:  11/10/2008  Job:  [id number]

## 2011-03-02 ENCOUNTER — Encounter: Payer: Self-pay | Admitting: *Deleted

## 2011-03-02 ENCOUNTER — Encounter: Payer: Self-pay | Admitting: Cardiology

## 2011-03-02 ENCOUNTER — Ambulatory Visit (INDEPENDENT_AMBULATORY_CARE_PROVIDER_SITE_OTHER): Payer: Medicare Other | Admitting: Cardiology

## 2011-03-02 DIAGNOSIS — E7800 Pure hypercholesterolemia, unspecified: Secondary | ICD-10-CM

## 2011-03-02 DIAGNOSIS — I739 Peripheral vascular disease, unspecified: Secondary | ICD-10-CM

## 2011-03-02 DIAGNOSIS — R0989 Other specified symptoms and signs involving the circulatory and respiratory systems: Secondary | ICD-10-CM

## 2011-03-02 DIAGNOSIS — R079 Chest pain, unspecified: Secondary | ICD-10-CM

## 2011-03-02 DIAGNOSIS — I10 Essential (primary) hypertension: Secondary | ICD-10-CM

## 2011-03-02 DIAGNOSIS — R0609 Other forms of dyspnea: Secondary | ICD-10-CM

## 2011-03-02 DIAGNOSIS — E785 Hyperlipidemia, unspecified: Secondary | ICD-10-CM

## 2011-03-02 DIAGNOSIS — J4489 Other specified chronic obstructive pulmonary disease: Secondary | ICD-10-CM

## 2011-03-02 DIAGNOSIS — J449 Chronic obstructive pulmonary disease, unspecified: Secondary | ICD-10-CM

## 2011-03-02 LAB — CBC WITH DIFFERENTIAL/PLATELET
Basophils Absolute: 0 10*3/uL (ref 0.0–0.1)
Basophils Relative: 0.6 % (ref 0.0–3.0)
Eosinophils Absolute: 0.2 10*3/uL (ref 0.0–0.7)
Eosinophils Relative: 2.8 % (ref 0.0–5.0)
HCT: 45.8 % (ref 36.0–46.0)
Hemoglobin: 15.8 g/dL — ABNORMAL HIGH (ref 12.0–15.0)
Lymphocytes Relative: 24.3 % (ref 12.0–46.0)
Lymphs Abs: 1.6 10*3/uL (ref 0.7–4.0)
MCHC: 34.4 g/dL (ref 30.0–36.0)
MCV: 92.9 fl (ref 78.0–100.0)
Monocytes Absolute: 0.7 10*3/uL (ref 0.1–1.0)
Monocytes Relative: 10.3 % (ref 3.0–12.0)
Neutro Abs: 4.2 10*3/uL (ref 1.4–7.7)
Neutrophils Relative %: 62 % (ref 43.0–77.0)
Platelets: 330 10*3/uL (ref 150.0–400.0)
RBC: 4.93 Mil/uL (ref 3.87–5.11)
RDW: 15.1 % — ABNORMAL HIGH (ref 11.5–14.6)
WBC: 6.8 10*3/uL (ref 4.5–10.5)

## 2011-03-02 LAB — HEPATIC FUNCTION PANEL
ALT: 51 U/L — ABNORMAL HIGH (ref 0–35)
AST: 36 U/L (ref 0–37)
Albumin: 4.1 g/dL (ref 3.5–5.2)
Alkaline Phosphatase: 93 U/L (ref 39–117)
Bilirubin, Direct: 0.1 mg/dL (ref 0.0–0.3)
Total Bilirubin: 0.3 mg/dL (ref 0.3–1.2)
Total Protein: 7.5 g/dL (ref 6.0–8.3)

## 2011-03-02 LAB — BASIC METABOLIC PANEL
BUN: 22 mg/dL (ref 6–23)
CO2: 26 mEq/L (ref 19–32)
Calcium: 9.8 mg/dL (ref 8.4–10.5)
Chloride: 104 mEq/L (ref 96–112)
Creatinine, Ser: 0.9 mg/dL (ref 0.4–1.2)
GFR: 63.26 mL/min (ref >60.00)
Glucose, Bld: 73 mg/dL (ref 70–99)
Potassium: 5 mEq/L (ref 3.5–5.1)
Sodium: 137 mEq/L (ref 135–145)

## 2011-03-02 LAB — PROTIME-INR
INR: 0.9 ratio (ref 0.8–1.0)
Prothrombin Time: 10.1 s — ABNORMAL LOW (ref 10.2–12.4)

## 2011-03-02 MED ORDER — SIMVASTATIN 20 MG PO TABS: 20.0000 mg | ORAL_TABLET | Freq: Every evening | ORAL | Status: DC

## 2011-03-02 MED ORDER — BUPROPION HCL ER (SR) 150 MG PO TB12: ORAL_TABLET | ORAL | Status: DC

## 2011-03-02 MED ORDER — METOPROLOL TARTRATE 25 MG PO TABS: ORAL_TABLET | ORAL | Status: DC

## 2011-03-02 NOTE — Op Note (Signed)
NAMETASNIA, SPEGAL NO.:  [id number]   MEDICAL RECORD NO.:  [id number]          PATIENT TYPE:  AMB   LOCATION:  ENDO                         FACILITY:  [facility]   PHYSICIAN:  Bernette Redbird, M.D.   DATE OF BIRTH:  [birth year]   DATE OF PROCEDURE:  07/11/2004  DATE OF DISCHARGE:                                 OPERATIVE REPORT   PROCEDURE:  Colonoscopy.   INDICATIONS:  Follow-up of prior colonic adenomas in a 65 year old female,  status post a partial colectomy for complicated diverticular disease.   FINDINGS:  Solitary right-sided and left-sided diverticula.   PROCEDURE:  The nature, purpose, and risks of the procedure were familiar to  the patient from prior examination, and she provided written consent.  No IV  sedation was administered, per her request, and she tolerated the procedure  remarkably well.  The Olympus adjustable-tension pediatric video colonoscope  was advanced readily to the cecum.  The cecum was identified by clear  visualization of the appendiceal orifice.  I was not able to enter the  terminal ileum, however.   Gradual pullback was then performed.  The quality of the prep was very good,  and it was felt that all areas were well-seen.   Other than a solitary diverticulum on the right side and near the recto-colo  anastomosis, this was a normal examination without evidence of recurrent  polyps or any evidence of cancer, colitis, or vascular malformations.  Retroflexion in the rectum was normal, as was reinspection of the rectum and  lower colon.   No biopsies were obtained.  The patient tolerated the procedure well, and  there were no apparent complications.   IMPRESSION:  Prior history of colonic polyps without worrisome findings on  current exam (V12.72).   PLAN:  Follow-up colonoscopy in five years.      RB/MEDQ  D:  07/11/2004  T:  07/11/2004  Job:  [id number]   cc:   Ellin Saba., M.D.  [address] [place]  [place] [address]  Fax: [phone]

## 2011-03-02 NOTE — Patient Instructions (Signed)
Your physician recommends that you schedule a follow-up appointment 2 weeks after cath with Dr. Shirlee Latch Your physician has recommended you make the following change in your medication: Start Wellbutrin 150 mg by mouth every morning for three days and then increase to 150 mg by mouth twice daily for 3 months. Start Zocor 20 mg by mouth daily at bedtime. Start metoprolol 12.5 mg by mouth twice daily ( this will be half of a 25 mg tablet) Your physician has requested that you have an echocardiogram. Echocardiography is a painless test that uses sound waves to create images of your heart. It provides your doctor with information about the size and shape of your heart and how well your heart's chambers and valves are working. This procedure takes approximately one hour. There are no restrictions for this procedure. Your physician has requested that you have a lower or upper extremity arterial duplex. This test is an ultrasound of the arteries in the legs or arms. It looks at arterial blood flow in the legs and arms. Allow one hour for Lower and Upper Arterial scans. There are no restrictions or special instructions Your physician has requested that you have a cardiac catheterization. Cardiac catheterization is used to diagnose and/or treat various heart conditions. Doctors may recommend this procedure for a number of different reasons. The most common reason is to evaluate chest pain. Chest pain can be a symptom of coronary artery disease (CAD), and cardiac catheterization can show whether plaque is narrowing or blocking your heart's arteries. This procedure is also used to evaluate the valves, as well as measure the blood flow and oxygen levels in different parts of your heart. For further information please visit [email or web address] Please follow instruction sheet, as given. Scheduled for Mar 07, 2011 at noon with Dr. Shirlee Latch Your physician recommends that you return for  lab work in: 2 weeks on day of appt with  Dr. Barbaraann Cao function test-272.0 Your physician recommends that you return for fasting lab work in: 2 months-liver function tests, lipid profile-272.0

## 2011-03-02 NOTE — Discharge Summary (Signed)
NAMEJOY, REIGER NO.:  [id number]   MEDICAL RECORD NO.:  [id number]          PATIENT TYPE:  INP   LOCATION:  6737                         FACILITY:  [facility]   PHYSICIAN:  Rene Paci, M.D. LHCDATE OF BIRTH:  07/13/1946   DATE OF ADMISSION:  07/27/2005  DATE OF DISCHARGE:  07/30/2005                                 DISCHARGE SUMMARY   DISCHARGE DIAGNOSES:  Splenic infarct.   HISTORY OF PRESENT ILLNESS:  The patient is a 64 year old female who was  admitted on July 27, 2005, with complaints of abdominal pain on the left  side which she has had intermittently for approximately six weeks. The  patient also noted that this pain was accompanied by nausea and vomiting.  She was seen by Dr. Matthias Hughs on the day of admission who performed a CT of  the abdomen and pelvis which revealed questionable splenic infarct. She was  advised by GI to be admitted by internal medicine, [place]. The patient was  admitted for further evaluation.   PAST MEDICAL HISTORY:  1.  Hypertension.  2.  Fibromyalgia.  3.  COPD.  4.  GERD.  5.  History of clot in leg, but states was never treated with Coumadin.   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 CT of the abdomen and pelvis was performed which showed  a questionable splenic infarct of indeterminate age. The patient was  admitted and was evaluated by Dr. Donavan Burnet team. A 2-D echocardiogram was  performed which was within normal limits. The patient was given pain  medications as well as antiemetics as needed during hospitalization.   DISCHARGE MEDICATIONS:  There were no changes made to the patient's  medications.   FOLLOWUP:  The patient is instructed to follow up with Dr. Scotty Court in two  to three weeks and Dr. Matthias Hughs as instructed. Dr. Matthias Hughs planned to set up  outpatient hematology/oncology for further evaluation.      Melissa S. Peggyann Juba, NP      Rene Paci, M.D. [facility]  Electronically Signed    MSO/MEDQ  D:  11/28/2005  T:  11/28/2005  Job:  [id number]

## 2011-03-02 NOTE — Op Note (Signed)
[name], KOPEC NO.:  [id number]   MEDICAL RECORD NO.:  [id number]          PATIENT TYPE:  INP   LOCATION:  3305                         FACILITY:  [facility]   PHYSICIAN:  Salvatore Decent. Dorris Fetch, M.D.DATE OF BIRTH:  1946/01/14   DATE OF PROCEDURE:  09/11/2005  DATE OF DISCHARGE:                                 OPERATIVE REPORT   PREOPERATIVE DIAGNOSIS:  Right upper lobe mass.   POSTOPERATIVE DIAGNOSIS:  Inflammatory/granulomatous disease, right upper  lobe, no tumor seen.   PROCEDURE:  1.  Right video-assisted thoracic surgery.  2.  Wedge resection of right upper lobe mass.   SURGEON:  Salvatore Decent. Dorris Fetch, M.D.   ASSISTANT:  Rowe Clack, P.A.-C.   ANESTHESIA:  General.   FINDINGS:  A 2 x 3-cm mass, right apex.  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   CLINICAL NOTE:  Ms. Amerman is a 65 year old female who was recently being  worked up for abdominal pain.  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  During that evaluation, she had a CT of  the chest which showed a right upper lobe mass.  A PET scan was weakly  positive and suggested inflammatory disease.  These results were discussed  in detail with the patient and her husband and the option of serial  radiologic followup versus surgical excisional biopsy was discussed.  The  patient strongly wished to proceed with surgical excisional biopsy in order  to have a definitive diagnosis.  The indications, risks, benefits and  alternatives were discussed.  She understood and accepted the risk and  agreed to proceed.   OPERATIVE NOTE:  Ms. Chestang was brought the preop holding area on September 11, 2005.  There, the anesthesia service placed lines to monitor arterial  blood pressure; they also placed intravenous access and intravenous  antibiotics were administered.  She was taken to the  operating room,  anesthetized and intubated with a double-lumen endotracheal tube.  A Foley  catheter was placed.  PAS hose were placed for DVT prophylaxis.  The patient  was placed in a left lateral decubitus position and the right chest was  prepped and draped in the usual fashion.   An incision was made in the 8th intercostal space in the mid-axillary line  and was carried through skin and  subcutaneous tissue.  The chest was  entered bluntly using a hemostat.  Single-lung ventilation of the left lung  was carried out and the right lung was not ventilated.  There was good  separation of the lungs.  A port was inserted through the intercostal space  and the thoracoscope was placed through the port.  Additional incisions were  made just below the tip of the scapula posteriorly as well as in the  anterior axillary line.  There were adhesions at the apex of the lung to the  parietal pleura; these were taken down with electrocautery.  The apex of the  lung then was retracted downward and  palpation revealed the mass to be just  below the apex.  The mass was gently grasped and a wedge resection was done  of this apex with 3 firings of Echelon 60-mm stapler using a green 4.8-mm  staple load.  The specimen then was removed through the anterior incision,  which had to be lengthened slightly to allow removal of specimen.  The ribs  were not spread on any of the incisions.   Inspection was made for hemostasis at the staple lines as well as the  incisions.  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  There was no evidence of cancer.  The scope was reinserted and  one last inspection for hemostasis was performed.  An On-Q catheter was  placed in a subpleural location posteriorly and 0.5% Marcaine was infused  through the On-Q catheter.  A 28-French chest tube was placed through the  original mid-axillary  line incision and directed to the apex and secured  with a #1 silk suture.  The remaining 2 incisions were closed with #1 Vicryl  fascial sutures, 2-0 Vicryl subcutaneous sutures and 3-0 Vicryl subcuticular  sutures.  All sponge, needle and instrument counts were correct at the end  of the procedure.  The patient was taken from the operating room to the  postanesthetic care unit , extubated and in stable condition.           ______________________________  Salvatore Decent Dorris Fetch, M.D.     SCH/MEDQ  D:  09/11/2005  T:  09/12/2005  Job:  [id number]   cc:   Ellin Saba., M.D.  [address] [place]  [place] [address]   Bernette Redbird, M.D.  Fax: [phone]

## 2011-03-02 NOTE — Consult Note (Signed)
NAME:  [name], SCHMOKER NO.:  [id number]   MEDICAL RECORD NO.:  [id number]          PATIENT TYPE:  INP   LOCATION:  6737                         FACILITY:  [facility]   PHYSICIAN:  Petra Kuba, M.D.    DATE OF BIRTH:  09-28-1946   DATE OF CONSULTATION:  07/27/2005  DATE OF DISCHARGE:                                   CONSULTATION   HISTORY:  The patient followed by Dr. Matthias Hughs, I have discussed the case  with him with workup for her left-sided abdominal pain, 10 pounds weight  loss, compatible with splenic infarcts.  I have reviewed it with Dr. Quincy Carnes, could also be lymphoma.  Other than the pain, has not really had  much symptoms, although does have some increased shortness of breath and  been diagnosed with some emphysema.  Has not had any fevers.  Food really  does not seem to bother her.  Workup has included labs, CT scan mentioned  above, and ultrasound which have not been revealing for any other etiology,  and she did have a colonoscopy by Dr. Matthias Hughs last year.   PAST MEDICAL HISTORY:  1.  Partial colectomy.  2.  Hysterectomy.  3.  Appendectomy.  4.  She does have some arthritis.  5.  COPD.  6.  Reflux.  7.  Hypertension.  8.  Fibromyalgia.   FAMILY HISTORY:  Pertinent for her mom with colon cancer.  No other obvious  GI problems in the family.   CURRENT MEDICATIONS:  1.  Mobic.  2.  Lotrel.  3.  Lorazepam.  4.  Premarin.  5.  Nexium.  6.  Advair.  7.  Vicodin.  8.  Amitriptyline.   REVIEW OF SYSTEMS:  Pertinent for no eye changes or any other worrisome  symptom.   ALLERGIES:  SULFA.   PHYSICAL EXAMINATION:  GENERAL:  No acute distress, laying comfortably in  the bed.  VITAL SIGNS:  Stable, afebrile.  LUNGS:  Clear.  HEART:  Regular rate and rhythm.  I do not appreciate a murmur.  ABDOMEN:  Soft, essentially nontender.   LABORATORY DATA:  CBC and CMET are normal.  CAT scan reviewed as above with  Dr. Lyman Bishop.   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   PLAN:  Go ahead and get a chest x-ray and an echocardiogram to rule out any  valvular disease, and probably will need surgical consultation.  Possibly, a  good funduscopic examination to rule out any further evidence of other  thrombotic events there.  Dr. Matthias Hughs to see in the morning to give you any  other recommendations.  Hopefully, we can get the workup done quickly and  make a decision whether she needs surgery or not for this.           ______________________________  Petra Kuba, M.D.     MEM/MEDQ  D:  07/27/2005  T:  07/28/2005  Job:  [id number]   cc:   Ellin Saba., M.D.  [address] [place]  [place] [address]   Elita Quick E.  Drue Novel, MD [facility]  [phone] [address] [place], [place] [address]   Bernette Redbird, M.D.  Fax: [phone]

## 2011-03-02 NOTE — Discharge Summary (Signed)
NAMEARWYN, BESAW NO.:  [id number]   MEDICAL RECORD NO.:  [id number]          PATIENT TYPE:  INP   LOCATION:  3305                         FACILITY:  [facility]   PHYSICIAN:  Salvatore Decent. Hendrickson, M.D.DATE OF BIRTH:  [birth year]   DATE OF ADMISSION:  09/11/2005  DATE OF DISCHARGE:                                 DISCHARGE SUMMARY   PRIMARY DIAGNOSIS:  Right upper lobe mass, positive for necrotizing  granulomatous inflammation with cystic areas of necrosis and focal  calcifications.  Negative for organisms.   SECONDARY DIAGNOSES:  1.  Hypertension.  2.  Emphysema with bronchitis.   IN-HOSPITAL OPERATIONS PROCEDURES:  Right video-assisted thoracic surgery  with wedge resection of right upper lobe mass.   PATIENT'S HISTORY AND PHYSICAL AND HOSPITAL COURSE:  [name] is a 65-year-  old female with a history of tobacco use with no previous history of cancer.  The patient presented with two month history of nausea and abdominal pain  primarily left-sided.  During workup, a CT of the abdomen was done which  suggested questionable splenic infarcts.  This led to further workup  including a CT of the chest which showed a cavity right upper lobe lesion.  This also showed emphysematous changes.  Echocardiogram was obtained which  showed normal LV function, no evidence of valvular vegetations or  intracardiac thrombus.  The patient underwent PET scan following evaluation  of CT which showed a cavity mass in the right upper lobe which has only  minimal increase in FDG activity suggesting an inflammatory process.  However, they could not exclude a neoplasm of low level of FDG activity.  Following PET scan, Dr. Dorris Fetch discussed with patient undergoing right  VATS with wedge resection.  He discussed the risks and benefits of this  procedure.  Patient acknowledged understanding and wished to proceed.  She  was scheduled for surgery for September 03, 2005.   The patient  was taken to the operating room on September 03, 2005 where she  underwent right video-assisted thoracic surgery with wedge resection of  right upper lobe mass.  The patient tolerated this procedure well and  transferred up to the intensive care unit in stable condition.  The  patient's pathology report came back positive for necrotizing granulomatous  inflammation with cystic areas of necrosis and focal calcifications.  Negative for organisms, margins were negative.  Also showed emphysematous  changes including bleb formation.  No evidence of malignancy.   The patient's postoperative course was pretty much unremarkable.  She  remained hemodynamically stable.  She was able to be weaned off the oxygen,  saturating greater than 90% prior to discharge home.  Her incisions remained  dry and intact and healing well.  She was out of bed, ambulating well.  The  patient's postoperative chest x-rays were stable.  She was placed to water  seal on postoperative day 1.  Follow-up chest x-ray remained stable.  No  pneumothorax noted.  Postop day 2, no air leak noted.  The chest tube was  discontinued postoperative day 2.  Follow-up STAT chest x-ray showed  no  pneumothorax noted.   The patient remained in normal sinus rhythm postoperatively.  She was out of  bed ambulating well.  Tolerating regular diet.  No nausea or vomiting noted.   The patient is tentatively ready for discharge September 14, 2005,  postoperative day 3.  Follow-up appointment was scheduled with Dr. Laneta Simmers  for Tuesday, December 12 at 1:15 p.m.  The patient is to obtain a PA and  lateral chest x-ray one hour prior to this appointment.  Ms. Walsworth received  instructions on diet, activity level, incisional care.  She was told no  driving until reduced to do so and no heavy lifting of over 10 pounds.  The  patient was told she is allowed to shower, washing her incisions using soap  and water.  She is to contact the office if she develops  any drainage or  opening from any of her incision sites.  The patient is told to ambulate  three to four times per day, progress as tolerated.  She is told to continue  her breathing exercises.  She was educated on diet to be low fat, low salt.  The patient acknowledged her understanding.   DISCHARGE MEDICATIONS:  1.  Elavil 25 mg p.o. q.h.s.  2.  Lotrel 5/20 daily.  3.  Premarin 0.625 mg daily.  4.  Mobic 15 mg daily.  5.  Nexium 40 mg daily.  6.  Ativan 2 mg at night.  7.  Tylox one to two tabs p.o. q.4-6h. p.r.n. pain.      Theda Belfast, PA    ______________________________  Salvatore Decent Dorris Fetch, M.D.    KMD/MEDQ  D:  09/13/2005  T:  09/14/2005  Job:  [id number]

## 2011-03-04 DIAGNOSIS — R079 Chest pain, unspecified: Secondary | ICD-10-CM | POA: Insufficient documentation

## 2011-03-04 DIAGNOSIS — I739 Peripheral vascular disease, unspecified: Secondary | ICD-10-CM | POA: Insufficient documentation

## 2011-03-04 NOTE — Assessment & Plan Note (Signed)
Patient gets exertional chest pain that given her risk factors and known vascular disease is highly worrisome for angina.  Symptoms have gradually worsened.  She is supposed to undergo aorto-mesenteric bypass on 5/30.  She needs her coronary anatomy defined prior to this.   - Left heart cath to be arranged for next week.  - Continue ASA 81 mg daily, add metoprolol 12.5 mg bid.  - Start statin.  - Check echocardiogram for LV and RV function as well as PA pressure in the setting of COPD.

## 2011-03-04 NOTE — Assessment & Plan Note (Signed)
Abnormal pedal pulses with calf pain with exertion.  I will check lower extremity arterial dopplers to look for PAD involving the leg circulation.

## 2011-03-04 NOTE — Assessment & Plan Note (Signed)
BP is controlled on current regimen.  

## 2011-03-04 NOTE — Progress Notes (Signed)
PCP: Dr. Scotty Court  65 yo with history of smoking, HTN, COPD, hyperlipidemia, and mesenteric vascular disease presents for cardiology evaluation.  Patient has been having nocturnal and post-prandial abdominal cramping.  She had a mesenteric angiogram done earlier this month by Dr. Imogene Burn, showing occluded celiac and SMA arteries with patent IMA.  Plan now for aorto-mesenteric bypass at the end of the month.    Patient has no cardiac history.  However, she has multiple risk factors, including known vascular disease, smoking, hyperlipidemia, HTN, and family history of premature CAD. She has been getting a squeezing-type chest tightness with heavy activity such as strenuous yardwork for about a year.  She gets milder chest tightness + shortness of breath if she walks briskly. These symptoms have gradually worsened over the last year.  About once a week, she wakes up with chest tightness.  She also gets GERD symptoms but states that the current symptoms are different from her GERD.  She gets bilateral calf pain with walking also. She is still smoking 1/2 ppd.  She has never had a stress test.  Patient has been followed by Dr. Matthias Hughs for mildly elevated LFTs.  The last check was done in 5/12 at his office.  Per the patient, LFTs were back to normal.   ECG: NSR, normal  Labs (1/12): LDL 166, HDL 87, TSH decreased, AST 97, ALT 129, K 4.9, creatinine 0.6 Labs (5/12): LFTs "normalized" per patient (labs done at Dr. Donavan Burnet office).  PMH: 1. Hypothyroidism 2. HTN 3. GERD 4. Hypothyroidism 5. OSA 6. 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  The IMA was patent. Plan for aorto-mesenteric bypass on 5/30.  8. Hyperlipidemia 9. COPD: Active smoker 10. Hyperlipidemia 11. Fibromyalgia 12. Breast cancer s/p mastectomy and chemotherapy in 2010.  13. CAD: Probable stable angina.   SH: Married, lives in  [place], smokes 1/2 ppd  FH: Father with MI at 51, brother with MI at 67  ROS: All systems reviewed and negative except as per HPI.   Current Outpatient Prescriptions  Medication Sig Dispense Refill  . amitriptyline (ELAVIL) 25 MG tablet Take 25 mg by mouth at bedtime.       Marland Kitchen amLODipine-benazepril (LOTREL) 10-20 MG per capsule Take 1 capsule by mouth daily.        Marland Kitchen aspirin 81 MG tablet Take 81 mg by mouth daily.        . B Complex Vitamins (VITAMIN-B COMPLEX PO) Take by mouth daily.        . Calcium Carbonate-Vitamin D (CALCIUM + D PO) Take by mouth daily.        . Cholecalciferol (VITAMIN D-3 PO) Take by mouth daily.        . diclofenac (VOLTAREN) 75 MG EC tablet Take 75 mg by mouth 2 (two) times daily.        Marland Kitchen levothyroxine (SYNTHROID, LEVOTHROID) 112 MCG tablet Take 112 mcg by mouth daily.        Marland Kitchen LORazepam (ATIVAN) 2 MG tablet Take 2 mg by mouth every 8 (eight) hours as needed.        Marland Kitchen omeprazole (PRILOSEC) 40 MG capsule Take 40 mg by mouth 2 (two) times daily.        Marland Kitchen buPROPion (WELLBUTRIN SR) 150 MG 12 hr tablet Take one tablet by mouth every AM for 3 days then one tablet by mouth twice daily for 3 months  60 tablet  3  .  metoprolol (LOPRESSOR) 25 MG tablet Take half tablet by mouth twice daily   30 tablet  11  . simvastatin (ZOCOR) 20 MG tablet Take 1 tablet (20 mg total) by mouth every evening.  30 tablet  11    BP 115/72  Pulse 84  Resp 16  Ht 5\' 6"  (1.676 m)  Wt 139 lb (63.05 kg)  BMI 22.44 kg/m2 General: NAD, thin Neck: No JVD, no thyromegaly or thyroid nodule.  Lungs: Distant breath sounds bilaterally.  CV: Nondisplaced PMI.  Heart regular S1/S2, no S3/S4, no murmur.  No peripheral edema.  No carotid bruit.  Right foot with 1+ PT pulse, no DP palpable.  Left foot with no PT pulse palpable but 2+ DP.  Abdomen: Soft, nontender, no hepatosplenomegaly, no distention.  Skin: Intact without lesions or rashes.  Neurologic: Alert and oriented x 3.  Psych: Normal  affect. Extremities: No clubbing or cyanosis.  HEENT: Normal.

## 2011-03-04 NOTE — Assessment & Plan Note (Signed)
Patient is still smoking.  I strongly advised her to quit.  I will let her try Wellbutrin.

## 2011-03-04 NOTE — Assessment & Plan Note (Signed)
Goal LDL with known vascular disease is < 70.  Patient's LDL is 166.  She had elevated LFTs in 1/12 of uncertain etiology (following with Dr. Matthias Hughs).  Per patient, repeat LFTs were normal earlier this month.  I am going to repeat LFTs.  I am going to start her on simvastatin 20 mg daily and will follow LFTs closely with repeat check in 2 wks.  Repeat lipids in 2 months.

## 2011-03-07 ENCOUNTER — Ambulatory Visit (HOSPITAL_COMMUNITY)
Admission: RE | Admit: 2011-03-07 | Discharge: 2011-03-07 | Disposition: A | Discharge status: Home / Self Care | Payer: Medicare Other | Source: Ambulatory Visit | Attending: Vascular Surgery | Admitting: Vascular Surgery

## 2011-03-07 ENCOUNTER — Encounter (HOSPITAL_COMMUNITY)
Admission: RE | Admit: 2011-03-07 | Discharge: 2011-03-07 | Disposition: A | Discharge status: Home / Self Care | Payer: Medicare Other | Source: Ambulatory Visit | Attending: Vascular Surgery | Admitting: Vascular Surgery

## 2011-03-07 ENCOUNTER — Other Ambulatory Visit: Payer: Self-pay | Admitting: Vascular Surgery

## 2011-03-07 ENCOUNTER — Other Ambulatory Visit: Payer: Medicare Other | Admitting: *Deleted

## 2011-03-07 ENCOUNTER — Inpatient Hospital Stay (HOSPITAL_BASED_OUTPATIENT_CLINIC_OR_DEPARTMENT_OTHER)
Admission: RE | Admit: 2011-03-07 | Discharge: 2011-03-07 | Disposition: A | Discharge status: Home / Self Care | Payer: Medicare Other | Source: Ambulatory Visit | Attending: Cardiology | Admitting: Cardiology

## 2011-03-07 ENCOUNTER — Ambulatory Visit (HOSPITAL_COMMUNITY)
Admission: RE | Admit: 2011-03-07 | Discharge: 2011-03-07 | Disposition: A | Discharge status: Home / Self Care | Payer: Medicare Other | Source: Ambulatory Visit | Attending: Cardiology | Admitting: Cardiology

## 2011-03-07 DIAGNOSIS — R079 Chest pain, unspecified: Secondary | ICD-10-CM | POA: Insufficient documentation

## 2011-03-07 DIAGNOSIS — J4489 Other specified chronic obstructive pulmonary disease: Secondary | ICD-10-CM | POA: Insufficient documentation

## 2011-03-07 DIAGNOSIS — R059 Cough, unspecified: Secondary | ICD-10-CM | POA: Insufficient documentation

## 2011-03-07 DIAGNOSIS — J449 Chronic obstructive pulmonary disease, unspecified: Secondary | ICD-10-CM | POA: Insufficient documentation

## 2011-03-07 DIAGNOSIS — F17200 Nicotine dependence, unspecified, uncomplicated: Secondary | ICD-10-CM | POA: Insufficient documentation

## 2011-03-07 DIAGNOSIS — J984 Other disorders of lung: Secondary | ICD-10-CM | POA: Insufficient documentation

## 2011-03-07 DIAGNOSIS — Z5309 Procedure and treatment not carried out because of other contraindication: Secondary | ICD-10-CM | POA: Insufficient documentation

## 2011-03-07 DIAGNOSIS — E875 Hyperkalemia: Secondary | ICD-10-CM | POA: Insufficient documentation

## 2011-03-07 DIAGNOSIS — Z01818 Encounter for other preprocedural examination: Secondary | ICD-10-CM | POA: Insufficient documentation

## 2011-03-07 DIAGNOSIS — Z79899 Other long term (current) drug therapy: Secondary | ICD-10-CM | POA: Insufficient documentation

## 2011-03-07 DIAGNOSIS — Z01812 Encounter for preprocedural laboratory examination: Secondary | ICD-10-CM | POA: Insufficient documentation

## 2011-03-07 DIAGNOSIS — R05 Cough: Secondary | ICD-10-CM | POA: Insufficient documentation

## 2011-03-07 LAB — COMPREHENSIVE METABOLIC PANEL
ALT: 37 U/L — ABNORMAL HIGH (ref 0–35)
AST: 25 U/L (ref 0–37)
Albumin: 3.9 g/dL (ref 3.5–5.2)
Alkaline Phosphatase: 112 U/L (ref 39–117)
BUN: 22 mg/dL (ref 6–23)
CO2: 24 mEq/L (ref 19–32)
Calcium: 10.7 mg/dL — ABNORMAL HIGH (ref 8.4–10.5)
Chloride: 104 mEq/L (ref 96–112)
Creatinine, Ser: 0.82 mg/dL (ref 0.4–1.2)
GFR calc Af Amer: >60 mL/min (ref >60)
GFR calc non Af Amer: >60 mL/min (ref >60)
Glucose, Bld: 106 mg/dL — ABNORMAL HIGH (ref 70–99)
Potassium: 6.3 mEq/L (ref 3.5–5.1)
Sodium: 135 mEq/L (ref 135–145)
Total Bilirubin: 0.4 mg/dL (ref 0.3–1.2)
Total Protein: 7.3 g/dL (ref 6.0–8.3)

## 2011-03-07 LAB — URINALYSIS, ROUTINE W REFLEX MICROSCOPIC
Bilirubin Urine: NEGATIVE
Glucose, UA: NEGATIVE mg/dL
Hgb urine dipstick: NEGATIVE
Ketones, ur: NEGATIVE mg/dL
Nitrite: NEGATIVE
Protein, ur: NEGATIVE mg/dL
Specific Gravity, Urine: 1.013 (ref 1.005–1.030)
Urobilinogen, UA: 0.2 mg/dL (ref 0.0–1.0)
pH: 5.5 (ref 5.0–8.0)

## 2011-03-07 LAB — URINE MICROSCOPIC-ADD ON

## 2011-03-07 LAB — POCT I-STAT, CHEM 8
BUN: 24 mg/dL — ABNORMAL HIGH (ref 6–23)
Calcium, Ion: 1.37 mmol/L — ABNORMAL HIGH (ref 1.12–1.32)
Chloride: 107 mEq/L (ref 96–112)
Creatinine, Ser: 1 mg/dL (ref 0.4–1.2)
Glucose, Bld: 92 mg/dL (ref 70–99)
HCT: 47 % — ABNORMAL HIGH (ref 36.0–46.0)
Hemoglobin: 16 g/dL — ABNORMAL HIGH (ref 12.0–15.0)
Potassium: 5.9 mEq/L — ABNORMAL HIGH (ref 3.5–5.1)
Sodium: 136 mEq/L (ref 135–145)
TCO2: 22 mmol/L (ref 0–100)

## 2011-03-07 LAB — CBC
HCT: 44.6 % (ref 36.0–46.0)
Hemoglobin: 15.5 g/dL — ABNORMAL HIGH (ref 12.0–15.0)
MCH: 31.3 pg (ref 26.0–34.0)
MCHC: 34.8 g/dL (ref 30.0–36.0)
MCV: 90.1 fL (ref 78.0–100.0)
Platelets: 365 10*3/uL (ref 150–400)
RBC: 4.95 MIL/uL (ref 3.87–5.11)
RDW: 14.4 % (ref 11.5–15.5)
WBC: 6.3 10*3/uL (ref 4.0–10.5)

## 2011-03-07 LAB — PROTIME-INR
INR: 0.93 (ref 0.00–1.49)
Prothrombin Time: 12.7 seconds (ref 11.6–15.2)

## 2011-03-07 LAB — APTT: aPTT: 26 seconds (ref 24–37)

## 2011-03-07 LAB — SURGICAL PCR SCREEN
MRSA, PCR: NEGATIVE
Staphylococcus aureus: NEGATIVE

## 2011-03-08 ENCOUNTER — Other Ambulatory Visit (INDEPENDENT_AMBULATORY_CARE_PROVIDER_SITE_OTHER): Payer: Medicare Other | Admitting: *Deleted

## 2011-03-08 DIAGNOSIS — E875 Hyperkalemia: Secondary | ICD-10-CM

## 2011-03-08 LAB — BASIC METABOLIC PANEL
BUN: 26 mg/dL — ABNORMAL HIGH (ref 6–23)
CO2: 26 mEq/L (ref 19–32)
Calcium: 9.7 mg/dL (ref 8.4–10.5)
Chloride: 105 mEq/L (ref 96–112)
Creatinine, Ser: 0.9 mg/dL (ref 0.4–1.2)
GFR: 68.26 mL/min (ref >60.00)
Glucose, Bld: 94 mg/dL (ref 70–99)
Potassium: 4.8 mEq/L (ref 3.5–5.1)
Sodium: 137 mEq/L (ref 135–145)

## 2011-03-09 ENCOUNTER — Other Ambulatory Visit: Payer: Self-pay | Admitting: Vascular Surgery

## 2011-03-09 ENCOUNTER — Ambulatory Visit (INDEPENDENT_AMBULATORY_CARE_PROVIDER_SITE_OTHER): Payer: Medicare Other | Admitting: Vascular Surgery

## 2011-03-09 ENCOUNTER — Telehealth: Payer: Self-pay | Admitting: *Deleted

## 2011-03-09 ENCOUNTER — Other Ambulatory Visit: Payer: Self-pay | Admitting: *Deleted

## 2011-03-09 ENCOUNTER — Ambulatory Visit
Admission: RE | Admit: 2011-03-09 | Discharge: 2011-03-09 | Disposition: A | Discharge status: Home / Self Care | Payer: Medicare Other | Source: Ambulatory Visit | Attending: Vascular Surgery | Admitting: Vascular Surgery

## 2011-03-09 ENCOUNTER — Encounter (INDEPENDENT_AMBULATORY_CARE_PROVIDER_SITE_OTHER): Payer: Medicare Other | Admitting: *Deleted

## 2011-03-09 DIAGNOSIS — I739 Peripheral vascular disease, unspecified: Secondary | ICD-10-CM

## 2011-03-09 DIAGNOSIS — K551 Chronic vascular disorders of intestine: Secondary | ICD-10-CM

## 2011-03-09 DIAGNOSIS — I70219 Atherosclerosis of native arteries of extremities with intermittent claudication, unspecified extremity: Secondary | ICD-10-CM

## 2011-03-09 DIAGNOSIS — R911 Solitary pulmonary nodule: Secondary | ICD-10-CM

## 2011-03-09 MED ORDER — IOHEXOL 350 MG/ML SOLN
100.0000 mL | Freq: Once | INTRAVENOUS | Status: AC | PRN
  Administered 2011-03-09: 100 mL via INTRAVENOUS

## 2011-03-09 NOTE — Telephone Encounter (Signed)
Called patient with lab results.  

## 2011-03-12 ENCOUNTER — Inpatient Hospital Stay (HOSPITAL_BASED_OUTPATIENT_CLINIC_OR_DEPARTMENT_OTHER)
Admission: RE | Admit: 2011-03-12 | Discharge: 2011-03-12 | Disposition: A | Discharge status: Home / Self Care | Payer: Medicare Other | Source: Ambulatory Visit | Attending: Cardiology | Admitting: Cardiology

## 2011-03-12 DIAGNOSIS — I739 Peripheral vascular disease, unspecified: Secondary | ICD-10-CM | POA: Insufficient documentation

## 2011-03-12 DIAGNOSIS — R0602 Shortness of breath: Secondary | ICD-10-CM | POA: Insufficient documentation

## 2011-03-12 DIAGNOSIS — I2510 Atherosclerotic heart disease of native coronary artery without angina pectoris: Secondary | ICD-10-CM

## 2011-03-12 DIAGNOSIS — R079 Chest pain, unspecified: Secondary | ICD-10-CM | POA: Insufficient documentation

## 2011-03-13 ENCOUNTER — Telehealth: Payer: Self-pay | Admitting: Cardiology

## 2011-03-13 ENCOUNTER — Other Ambulatory Visit (HOSPITAL_COMMUNITY): Payer: Medicare Other

## 2011-03-13 LAB — POCT I-STAT 4, (NA,K, GLUC, HGB,HCT)
Glucose, Bld: 103 mg/dL — ABNORMAL HIGH (ref 70–99)
HCT: 46 % (ref 36.0–46.0)
Hemoglobin: 15.6 g/dL — ABNORMAL HIGH (ref 12.0–15.0)
Potassium: 3.9 mEq/L (ref 3.5–5.1)
Sodium: 140 mEq/L (ref 135–145)

## 2011-03-13 NOTE — Assessment & Plan Note (Signed)
OFFICE VISIT  MARIACRISTINA, ADAY DOB:  [birth year]                                       03/09/2011 ZOXWR#:[id number]  This is an established patient followup.  HISTORY OF PRESENT ILLNESS:  This is a 65 year old patient that I am in the process of evaluating for chronic mesenteric ischemia.  She previously has had pain after eating, elevated LFTs.  However, she did not have any weight loss.  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  She however had a large hypertrophied inferior mesenteric artery with meandering mesenteric artery which reconstituted her distal SMA.  I could never get a full evaluation of her celiac artery access with injection.  I sent her for preoperative cardiology workup recently.  She was found to have hyperkalemia before her cardiac catheterization so that was cancelled and is being rescheduled for this coming week.  Additionally, since then she has seen her gastroenterologist.  Her abdominal pain has completely resolved. Her LFTs have resolved.  He feels that her symptomatology may be related to her chemotherapy.  At this point she denies any food fear, any weight loss or any postprandial pain.  Past medical history, past surgical history, social history, family history, review of systems, medications and allergies are completely unchanged.  PHYSICAL EXAMINATION:  Vital signs:  Blood pressure 120/78, heart rate 72, respirations were 12 and satting 97% on room air.  General:  Well- developed, well-nourished, in no apparent distress.  Pulmonary: Symmetric expansion.  No rales, rhonchi or wheezing.  Cardiac:  Regular rate and rhythm.  Normal S1-S2.  Vascular:  She had easily palpable upper and lower extremity pulses.  Abdomen:  Soft abdomen, nontender, nondistended.  No guarding, no rebound, no hepatosplenomegaly. Musculoskeletal:  She had 5/5 strength in all extremities.  No obvious ischemic changes  in any extremity.  Neuro:  No focal weakness or paresthesias.  Skin:  No obvious ulcers or rashes.  Lymphatics:  No cervical, axillary or inguinal lymphadenopathy.  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  There is a CTA of abdomen and pelvis that was just completed and is not available for review yet that I will look at to try to see if there is evidence of celiac flow on this study as on my mesenteric angiogram I could not demonstrate such.  Regardless she has 3 active issues:  1)  She had an episode of elevated hyperkalemia above 6.0 prior to recent cardiac cath so this will need to be worked up prior to proceeding.  2)  She has some concerns in regards to her cardiac status that will require preoperative cardiac catheterization and possible intervention.  3)  On her screening chest x-ray was found to have a right upper lobe nodule.  She previously has undergone partial lung resection on the right side with Dr. Dorris Fetch.  My plan is to refer her back to Dr. Dorris Fetch for evaluation of such.  At this point our plan is to cancel the surgery on Wednesday and hold on proceeding  for about a month.  We will see how her multiple active issues are resolved over that month and will make the final decision after that.    Fransisco Hertz, MD Electronically Signed  BLC/MEDQ  D:  03/09/2011  T:  03/13/2011  Job:  [id number]  cc:   Ellin Saba., MD

## 2011-03-13 NOTE — Telephone Encounter (Signed)
12 lead faxed to [facility] @  [phone]  03/13/11/km

## 2011-03-14 ENCOUNTER — Inpatient Hospital Stay (HOSPITAL_COMMUNITY): Admission: RE | Admit: 2011-03-14 | Payer: Medicare Other | Source: Ambulatory Visit | Admitting: Vascular Surgery

## 2011-03-16 ENCOUNTER — Encounter: Payer: Self-pay | Admitting: Cardiology

## 2011-03-16 ENCOUNTER — Other Ambulatory Visit: Payer: Medicare Other

## 2011-03-16 ENCOUNTER — Encounter: Payer: Self-pay | Admitting: Cardiovascular Disease

## 2011-03-20 ENCOUNTER — Encounter: Payer: Medicare Other | Admitting: Thoracic Surgery (Cardiothoracic Vascular Surgery)

## 2011-03-20 ENCOUNTER — Telehealth: Payer: Self-pay | Admitting: *Deleted

## 2011-03-20 DIAGNOSIS — I739 Peripheral vascular disease, unspecified: Secondary | ICD-10-CM

## 2011-03-20 NOTE — Telephone Encounter (Signed)
Notes Recorded by Jacqlyn Krauss, RN on 03/20/2011 at 6:17 PM I discussed with pt by telephone. Pt states she returned for more views 03/15/11. She agreed to referral to Dr Clifton James or Dr Excell Seltzer for possible right SFA occlusion. ------  Notes Recorded by Marca Ancona, MD on 03/17/2011 at 10:56 PM Possible focal right SFA occlusion. Supposed to return for more views. Would refer her for PV evaluation with McAlhany or Excell Seltzer

## 2011-03-20 NOTE — Telephone Encounter (Signed)
Message copied by Jacqlyn Krauss on Tue Mar 20, 2011  6:18 PM ------      Message from: Laurey Morale      Created: Sat Mar 17, 2011 10:56 PM       Possible focal right SFA occlusion.  Supposed to return for more views.  Would refer her for PV evaluation with Clifton James or Excell Seltzer.

## 2011-03-23 ENCOUNTER — Ambulatory Visit: Payer: Medicare Other | Admitting: Vascular Surgery

## 2011-03-26 ENCOUNTER — Encounter: Payer: Self-pay | Admitting: Cardiology

## 2011-03-27 ENCOUNTER — Encounter: Payer: Self-pay | Admitting: Cardiology

## 2011-03-27 ENCOUNTER — Ambulatory Visit (INDEPENDENT_AMBULATORY_CARE_PROVIDER_SITE_OTHER): Payer: Medicare Other | Admitting: Cardiology

## 2011-03-27 ENCOUNTER — Other Ambulatory Visit (INDEPENDENT_AMBULATORY_CARE_PROVIDER_SITE_OTHER): Payer: Medicare Other | Admitting: *Deleted

## 2011-03-27 DIAGNOSIS — R911 Solitary pulmonary nodule: Secondary | ICD-10-CM | POA: Insufficient documentation

## 2011-03-27 DIAGNOSIS — F17200 Nicotine dependence, unspecified, uncomplicated: Secondary | ICD-10-CM | POA: Insufficient documentation

## 2011-03-27 DIAGNOSIS — R0989 Other specified symptoms and signs involving the circulatory and respiratory systems: Secondary | ICD-10-CM

## 2011-03-27 DIAGNOSIS — K559 Vascular disorder of intestine, unspecified: Secondary | ICD-10-CM

## 2011-03-27 DIAGNOSIS — E785 Hyperlipidemia, unspecified: Secondary | ICD-10-CM

## 2011-03-27 DIAGNOSIS — R079 Chest pain, unspecified: Secondary | ICD-10-CM

## 2011-03-27 DIAGNOSIS — K551 Chronic vascular disorders of intestine: Secondary | ICD-10-CM | POA: Insufficient documentation

## 2011-03-27 DIAGNOSIS — I739 Peripheral vascular disease, unspecified: Secondary | ICD-10-CM

## 2011-03-27 DIAGNOSIS — I2510 Atherosclerotic heart disease of native coronary artery without angina pectoris: Secondary | ICD-10-CM

## 2011-03-27 DIAGNOSIS — Z720 Tobacco use: Secondary | ICD-10-CM | POA: Insufficient documentation

## 2011-03-27 LAB — HEPATIC FUNCTION PANEL
ALT: 35 U/L (ref 0–35)
AST: 30 U/L (ref 0–37)
Albumin: 4.2 g/dL (ref 3.5–5.2)
Alkaline Phosphatase: 83 U/L (ref 39–117)
Bilirubin, Direct: 0.1 mg/dL (ref 0.0–0.3)
Total Bilirubin: 0.7 mg/dL (ref 0.3–1.2)
Total Protein: 7.3 g/dL (ref 6.0–8.3)

## 2011-03-27 NOTE — Patient Instructions (Signed)
Lab today--liver profile 414.01  443.9  Schedule an appointment for fasting lab the end of July liver profile/lipid profile  443.9  414.01  Schedule an appointment for a pulmonary function test.  Schedule an appt to see Dr Shirlee Latch in 3 months.

## 2011-03-27 NOTE — Assessment & Plan Note (Signed)
I strongly encouraged the patient to quit smoking.  She is taking wellbutrin and is cutting back.

## 2011-03-27 NOTE — Assessment & Plan Note (Signed)
Severe proximal right SFA stenosis.  No rest symptoms or foot ulcerations.  She does have collaterals from the right profunda femoral artery.  Moderate claudication.  I will treat conservatively initially.  She should stop smoking and should try to walk through the pain to promote collateral development.

## 2011-03-27 NOTE — Progress Notes (Signed)
PCP: Dr. Scotty Court  66 yo with history of smoking, HTN, COPD, hyperlipidemia, and mesenteric vascular disease returns for cardiology evaluation.  Patient has been having nocturnal and post-prandial abdominal cramping.  She had a mesenteric angiogram done by Dr. Imogene Burn, showing occluded celiac and SMA arteries with patent IMA.  She was planned for aorto-mesenteric bypass. She was referred for cardiology evaluation prior to surgery.   Patient reported a squeezing-type chest tightness with heavy activity such as strenuous yardwork for about a year.  She gets milder chest tightness + shortness of breath if she walks briskly. These symptoms have gradually worsened over the last year.  About once a week, she wakes up with chest tightness.  She also gets GERD symptoms but states that the above symptoms are different from her GERD.  She gets right calf and thigh pain after walking for about 1/4 mile.   Given her exertional chest tightness and known vascular disease, I set her up for left heart cath.  This was done in 5/12 and showed a 70-80% ostial stenosis of a small to moderate 2nd diagonal.  This was too small to intervene on and not likely to be particularly symptomatic.  Lower extremity arterial dopplers showed a severe focal proximal right SFA stenosis with collaterals from the PFA.  She continues to smoke but has been cutting back since starting wellbutrin.  Finally, she was noted on CXR done by Dr. Imogene Burn to have a RUL nodule.  She was set up for followup with Dr. Dorris Fetch but did not keep the appointment.  Vascular surgery was cancelled until workup of the lung nodule and completion of cardiac workup.   Labs (1/12): LDL 166, HDL 87, TSH decreased, AST 97, ALT 129, K 4.9, creatinine 0.6 Labs (5/12): K 4.8, creatinine 0.9, AST 25, ALT 37.  PMH: 1. Hypothyroidism 2. HTN 3. GERD 4. Hypothyroidism 5. OSA 6. 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  The IMA was patent. Plan for aorto-mesenteric bypass.  8. Hyperlipidemia 9. COPD: Active smoker 10. Hyperlipidemia 11. Fibromyalgia 12. Breast cancer s/p mastectomy and chemotherapy in 2010.  13. CAD: LHC (5/12) with 70-80% ostial stenosis of a small-moderate 2nd diagonal.  This was too small to intervene upon and unlikely to cause significant symptoms.  14. PAD: Peripheral arterial dopplers (5/12) with severe focal proximal right SFA stenosis.  There were collaterals from the PFA.  Patient has claudication symptoms.  15. History of partial right lung lobectomy for lung infection (> 20 years ago).   SH: Married, lives in [place], smokes 1/2 ppd  FH: Father with MI at 31, brother with MI at 26  ROS: All systems reviewed and negative except as per HPI.   Current Outpatient Prescriptions  Medication Sig Dispense Refill  . amitriptyline (ELAVIL) 25 MG tablet Take 25 mg by mouth at bedtime.       Marland Kitchen aspirin 81 MG tablet Take 81 mg by mouth daily.        . B Complex Vitamins (VITAMIN-B COMPLEX PO) Take by mouth daily.        Marland Kitchen buPROPion (WELLBUTRIN SR) 150 MG 12 hr tablet Take one tablet by mouth every AM for 3 days then one tablet by mouth twice daily for 3 months  60 tablet  3  . Calcium Carbonate-Vitamin D (CALCIUM + D PO) Take 600 mg by mouth daily.       . Cholecalciferol (VITAMIN D-3 PO)  Take 2,000 Units by mouth daily.       . diclofenac (VOLTAREN) 75 MG EC tablet Take 75 mg by mouth daily.       Marland Kitchen levothyroxine (SYNTHROID, LEVOTHROID) 125 MCG tablet Take 125 mcg by mouth daily.        Marland Kitchen LORazepam (ATIVAN) 2 MG tablet Take 2 mg by mouth every 8 (eight) hours as needed.        . metoprolol tartrate (LOPRESSOR) 25 MG tablet Take 25 mg by mouth 2 (two) times daily.        Marland Kitchen omeprazole (PRILOSEC) 40 MG capsule Take 40 mg by mouth daily.       . simvastatin (ZOCOR) 20 MG tablet Take 1 tablet (20 mg total) by mouth every evening.   30 tablet  11  . DISCONTD: metoprolol (LOPRESSOR) 25 MG tablet Take half tablet by mouth twice daily   30 tablet  11  . DISCONTD: amLODipine-benazepril (LOTREL) 10-20 MG per capsule Take 1 capsule by mouth daily.        Marland Kitchen DISCONTD: levothyroxine (SYNTHROID, LEVOTHROID) 112 MCG tablet Take 112 mcg by mouth daily.         BP 102/62  Pulse 68  Ht 5\' 6"  (1.676 m)  Wt 141 lb (63.957 kg)  BMI 22.76 kg/m2 General: NAD, thin Neck: No JVD, no thyromegaly or thyroid nodule.  Lungs: Distant breath sounds bilaterally.  CV: Nondisplaced PMI.  Heart regular S1/S2, no S3/S4, no murmur.  No peripheral edema.  No carotid bruit.  Difficult to palpate pedal pulses right foot.  Left foot with no PT pulse palpable but 2+ DP.  Abdomen: Soft, nontender, no hepatosplenomegaly, no distention.  Neurologic: Alert and oriented x 3.  Psych: Normal affect. Extremities: No clubbing or cyanosis.

## 2011-03-27 NOTE — Assessment & Plan Note (Signed)
Left heart cath showed 70-80% moderate stenosis in a small 2nd diagonal.  Otherwise, no significant disease.  I do not think that this lesion is likely to cause significant symptoms and it is too small to intervene upon.  It is possible that the chest tightness is related to COPD/bronchospasm.  I will have her get PFTs.

## 2011-03-27 NOTE — Assessment & Plan Note (Signed)
RUL pulmonary nodule seen on recent CXR.  This is concerning given history of breast cancer.  Patient did have a right partial lobectomy in the distant past for a mass that turned out to be infectious.  She was referred by Dr. Imogene Burn to see Dr. Dorris Fetch but did not keep the appointment.  She is to see her oncologist this week, and I encouraged her to discuss this issue with him.

## 2011-03-27 NOTE — Assessment & Plan Note (Signed)
Simvastatin started with goal LDL < 70 in the setting of CAD and PAD.  Needs lipids/LFTs in 7/12.  Given her history of elevated LFTs (which had normalized when last checked), I will also check LFTs today now that she has been on simvastatin for about a month.

## 2011-03-27 NOTE — Assessment & Plan Note (Signed)
Patient has significant mesenteric vascular disease.  Surgery has been postponed (aorto-mesenteric bypass).  I reviewed Dr. Buccini's last note today.  He is not convinced that symptoms are actually due to mesenteric ischemia.  Would probably be valuable for there to be a discussion between Drs Buccini and Imogene Burn prior to proceeding to surgery.

## 2011-03-28 ENCOUNTER — Ambulatory Visit (INDEPENDENT_AMBULATORY_CARE_PROVIDER_SITE_OTHER): Payer: Medicare Other | Admitting: Internal Medicine

## 2011-03-28 DIAGNOSIS — J449 Chronic obstructive pulmonary disease, unspecified: Secondary | ICD-10-CM

## 2011-03-28 DIAGNOSIS — J4489 Other specified chronic obstructive pulmonary disease: Secondary | ICD-10-CM

## 2011-03-28 LAB — PULMONARY FUNCTION TEST

## 2011-03-28 NOTE — Progress Notes (Signed)
PFT done today. 

## 2011-03-29 ENCOUNTER — Encounter (HOSPITAL_BASED_OUTPATIENT_CLINIC_OR_DEPARTMENT_OTHER): Payer: Medicare Other | Admitting: Oncology

## 2011-03-29 ENCOUNTER — Other Ambulatory Visit: Payer: Self-pay | Admitting: Oncology

## 2011-03-29 DIAGNOSIS — R110 Nausea: Secondary | ICD-10-CM

## 2011-03-29 DIAGNOSIS — C50119 Malignant neoplasm of central portion of unspecified female breast: Secondary | ICD-10-CM

## 2011-03-29 DIAGNOSIS — Z170 Estrogen receptor positive status [ER+]: Secondary | ICD-10-CM

## 2011-03-29 LAB — COMPREHENSIVE METABOLIC PANEL
ALT: 32 U/L (ref 0–35)
AST: 24 U/L (ref 0–37)
Albumin: 4.5 g/dL (ref 3.5–5.2)
Alkaline Phosphatase: 90 U/L (ref 39–117)
BUN: 16 mg/dL (ref 6–23)
CO2: 26 mEq/L (ref 19–32)
Calcium: 9.7 mg/dL (ref 8.4–10.5)
Chloride: 103 mEq/L (ref 96–112)
Creatinine, Ser: 1.15 mg/dL — ABNORMAL HIGH (ref 0.50–1.10)
Glucose, Bld: 86 mg/dL (ref 70–99)
Potassium: 4.7 mEq/L (ref 3.5–5.3)
Sodium: 140 mEq/L (ref 135–145)
Total Bilirubin: 0.4 mg/dL (ref 0.3–1.2)
Total Protein: 7.1 g/dL (ref 6.0–8.3)

## 2011-03-30 ENCOUNTER — Encounter: Payer: Self-pay | Admitting: Cardiology

## 2011-04-13 ENCOUNTER — Ambulatory Visit: Payer: Medicare Other | Admitting: Vascular Surgery

## 2011-04-24 ENCOUNTER — Encounter: Payer: Self-pay | Admitting: Cardiology

## 2011-05-02 ENCOUNTER — Other Ambulatory Visit: Payer: Self-pay | Admitting: Cardiology

## 2011-05-02 ENCOUNTER — Other Ambulatory Visit (INDEPENDENT_AMBULATORY_CARE_PROVIDER_SITE_OTHER): Payer: Medicare Other | Admitting: *Deleted

## 2011-05-02 DIAGNOSIS — E7800 Pure hypercholesterolemia, unspecified: Secondary | ICD-10-CM

## 2011-05-02 LAB — LIPID PANEL
Cholesterol: 218 mg/dL — ABNORMAL HIGH (ref 0–200)
HDL: 98.6 mg/dL
Total CHOL/HDL Ratio: 2
Triglycerides: 68 mg/dL (ref 0.0–149.0)
VLDL: 13.6 mg/dL (ref 0.0–40.0)

## 2011-05-02 LAB — HEPATIC FUNCTION PANEL
ALT: 50 U/L — ABNORMAL HIGH (ref 0–35)
AST: 38 U/L — ABNORMAL HIGH (ref 0–37)
Albumin: 4.8 g/dL (ref 3.5–5.2)
Alkaline Phosphatase: 106 U/L (ref 39–117)
Bilirubin, Direct: 0.2 mg/dL (ref 0.0–0.3)
Total Bilirubin: 0.9 mg/dL (ref 0.3–1.2)
Total Protein: 8 g/dL (ref 6.0–8.3)

## 2011-05-02 LAB — LDL CHOLESTEROL, DIRECT: Direct LDL: 112.3 mg/dL

## 2011-05-03 NOTE — Cardiovascular Report (Signed)
[name], [name] NO.:  [id number]  MEDICAL RECORD NO.:  [id number]           PATIENT TYPE:  LOCATION:                                 FACILITY:  PHYSICIAN:  Marca Ancona, MD      DATE OF BIRTH:  1946-03-20  DATE OF PROCEDURE:  03/12/2011 DATE OF DISCHARGE:                           CARDIAC CATHETERIZATION   PROCEDURES: 1. Left heart catheterization. 2. Coronary angiography. 3. Left ventriculography.  INDICATION:  This is a 65 year old with history of peripheral arterial disease who also has been reporting exertional chest tightness that has been present for about a year.  She is scheduled to undergo a mesenteric arterial bypass and therefore, given her exertional chest pain, we decided to have her undergo left heart catheterization prior to her vascular surgery.  PROCEDURE NOTE:  After informed consent was obtained, the right groin was sterilely prepped and draped.  A 1% lidocaine was used to locally anesthetize the right groin area.  The right common femoral artery was entered using modified Seldinger technique, and a 4-French arterial sheath was placed.  The right coronary artery was engaged with the [facility] catheter, the left coronary artery was engaged with the JL-4 catheter, and left ventricle was entered using angled pigtail catheter.  There were no complications.  FINDINGS: 1. Hemodynamics:  Left ventricle 115/16.  Aorta 115/80. 2. Left ventriculography.  EF was estimated to be 55% with no regional     wall motion abnormalities. 3. Right coronary artery.  The right coronary artery was a dominant     vessel with mild luminal irregularities. 4. Left main.  The left main was very short. 5. Left circumflex system.  The left circumflex system had minimal     luminal irregularities. 6. LAD system.  There was a moderate-sized first diagonal with minimal     disease.  This had an early takeoff.  There was a small to moderate     second diagonal  with about 70-80% ostial stenosis.  The distal LAD     had an area of about 40-50% stenosis.  IMPRESSION:  This is a 65 year old who has reported exertional chest pain and shortness of breath.  She has known peripheral arterial disease.  Left heart catheterization with coronary angiography today shows no significant disease in her major coronary vessels.  She does have a 70-80% ostial stenosis in the small to moderate-sized second diagonal.  It does appear probably too small for intervention.  This could potentially be the source of some anginal symptoms although I wonder if most of her exertional symptoms may not be due to chronic obstructive pulmonary disease.  She needs to quit smoking.  I will increase her metoprolol from 12.5 twice a day to 25 mg twice a day.  She should continue on her baby aspirin and her statin.  No further workup is necessary prior to her vascular surgery.  She should use her beta-blocker perioperatively. I will follow up with her in about 2 weeks.     Marca Ancona, MD     DM/MEDQ  D:  03/12/2011  T:  03/12/2011  Job:  [id number]  cc:   Fransisco Hertz, MD Ellin Saba., MD  Electronically Signed by Marca Ancona MD on 05/03/2011 11:54:48 AM

## 2011-05-08 ENCOUNTER — Telehealth: Payer: Self-pay | Admitting: *Deleted

## 2011-05-08 DIAGNOSIS — I2510 Atherosclerotic heart disease of native coronary artery without angina pectoris: Secondary | ICD-10-CM

## 2011-05-08 DIAGNOSIS — I739 Peripheral vascular disease, unspecified: Secondary | ICD-10-CM

## 2011-05-08 MED ORDER — SIMVASTATIN 40 MG PO TABS: 40.0000 mg | ORAL_TABLET | Freq: Every evening | ORAL | Status: DC

## 2011-05-08 NOTE — Telephone Encounter (Signed)
Message copied by Jacqlyn Krauss on Tue May 08, 2011  6:22 PM ------      Message from: Laurey Morale      Created: Sun May 06, 2011 11:49 PM       Mild LFT elevation.  LDL still too high. Will increase LDL to simvastatin 40 mg daily.  Repeat LFTs in 1 month, then lipids/LFTs in 2 months.

## 2011-05-08 NOTE — Telephone Encounter (Signed)
Notes Recorded by Jacqlyn Krauss, RN on 05/08/2011 at 5:47 PM I talked with pt. He requested I talk with his wife. I discussed results with pt's wife. Liver profile scheduled for 06/01/11. ------  Notes Recorded by Marca Ancona, MD on 05/06/2011 at 11:21 PM LDL much better, mild rise in LFTs. Repeat LFTs in 1 month to make sure that there is no further rise.

## 2011-05-10 ENCOUNTER — Ambulatory Visit: Payer: Medicare Other | Admitting: Cardiology

## 2011-05-15 ENCOUNTER — Encounter (HOSPITAL_BASED_OUTPATIENT_CLINIC_OR_DEPARTMENT_OTHER): Payer: Medicare Other | Admitting: Oncology

## 2011-05-15 ENCOUNTER — Encounter: Payer: Self-pay | Admitting: Family Medicine

## 2011-05-15 ENCOUNTER — Other Ambulatory Visit: Payer: Self-pay | Admitting: Oncology

## 2011-05-15 DIAGNOSIS — R110 Nausea: Secondary | ICD-10-CM

## 2011-05-15 DIAGNOSIS — Z170 Estrogen receptor positive status [ER+]: Secondary | ICD-10-CM

## 2011-05-15 DIAGNOSIS — C50119 Malignant neoplasm of central portion of unspecified female breast: Secondary | ICD-10-CM

## 2011-05-15 LAB — CBC WITH DIFFERENTIAL/PLATELET
BASO%: 0.2 % (ref 0.0–2.0)
Basophils Absolute: 0 10*3/uL (ref 0.0–0.1)
EOS%: 1.8 % (ref 0.0–7.0)
Eosinophils Absolute: 0.1 10*3/uL (ref 0.0–0.5)
HCT: 42.1 % (ref 34.8–46.6)
HGB: 14.6 g/dL (ref 11.6–15.9)
LYMPH%: 21.1 % (ref 14.0–49.7)
MCH: 31.8 pg (ref 25.1–34.0)
MCHC: 34.8 g/dL (ref 31.5–36.0)
MCV: 91.4 fL (ref 79.5–101.0)
MONO#: 0.9 10*3/uL (ref 0.1–0.9)
MONO%: 11.5 % (ref 0.0–14.0)
NEUT#: 5.1 10*3/uL (ref 1.5–6.5)
NEUT%: 65.4 % (ref 38.4–76.8)
Platelets: 356 10*3/uL (ref 145–400)
RBC: 4.61 10*6/uL (ref 3.70–5.45)
RDW: 14.9 % — ABNORMAL HIGH (ref 11.2–14.5)
WBC: 7.8 10*3/uL (ref 3.9–10.3)
lymph#: 1.7 10*3/uL (ref 0.9–3.3)

## 2011-05-15 LAB — COMPREHENSIVE METABOLIC PANEL
ALT: 31 U/L (ref 0–35)
AST: 25 U/L (ref 0–37)
Albumin: 4.4 g/dL (ref 3.5–5.2)
Alkaline Phosphatase: 85 U/L (ref 39–117)
BUN: 16 mg/dL (ref 6–23)
CO2: 20 mEq/L (ref 19–32)
Calcium: 9.7 mg/dL (ref 8.4–10.5)
Chloride: 108 mEq/L (ref 96–112)
Creatinine, Ser: 0.91 mg/dL (ref 0.50–1.10)
Glucose, Bld: 94 mg/dL (ref 70–99)
Potassium: 4.1 mEq/L (ref 3.5–5.3)
Sodium: 142 mEq/L (ref 135–145)
Total Bilirubin: 0.4 mg/dL (ref 0.3–1.2)
Total Protein: 7.2 g/dL (ref 6.0–8.3)

## 2011-05-15 LAB — CANCER ANTIGEN 27.29: CA 27.29: 26 U/mL (ref 0–39)

## 2011-05-15 LAB — LACTATE DEHYDROGENASE: LDH: 156 U/L (ref 94–250)

## 2011-05-16 ENCOUNTER — Ambulatory Visit (INDEPENDENT_AMBULATORY_CARE_PROVIDER_SITE_OTHER): Payer: Medicare Other | Admitting: Family Medicine

## 2011-05-16 ENCOUNTER — Encounter: Payer: Self-pay | Admitting: Family Medicine

## 2011-05-16 DIAGNOSIS — K219 Gastro-esophageal reflux disease without esophagitis: Secondary | ICD-10-CM

## 2011-05-16 DIAGNOSIS — M1990 Unspecified osteoarthritis, unspecified site: Secondary | ICD-10-CM

## 2011-05-16 DIAGNOSIS — R945 Abnormal results of liver function studies: Secondary | ICD-10-CM

## 2011-05-16 DIAGNOSIS — M129 Arthropathy, unspecified: Secondary | ICD-10-CM

## 2011-05-16 DIAGNOSIS — Z853 Personal history of malignant neoplasm of breast: Secondary | ICD-10-CM

## 2011-05-16 DIAGNOSIS — R7989 Other specified abnormal findings of blood chemistry: Secondary | ICD-10-CM

## 2011-05-16 DIAGNOSIS — K55069 Acute infarction of intestine, part and extent unspecified: Secondary | ICD-10-CM

## 2011-05-16 DIAGNOSIS — F17200 Nicotine dependence, unspecified, uncomplicated: Secondary | ICD-10-CM

## 2011-05-16 DIAGNOSIS — K55059 Acute (reversible) ischemia of intestine, part and extent unspecified: Secondary | ICD-10-CM

## 2011-05-16 NOTE — Progress Notes (Signed)
[facility]  Established Mesenteric Ischemia  History of Present Illness  [name] is a 65 y.o. female who presents with chief complaint: follow up on CMI.  The patient notes noabdominal pain.  The patient notes no food fear.  The patient notes weight gain.  Patient PMH, PSH, SH, FamHx, Medications, Allergies, and ROS are unchanged from previous visit on 03/09/11.  Physical Examination  Filed Vitals:   05/18/11 1330  BP: 126/73  Pulse: 78   General: A&O x 3, WDWN, non-cachectic  Pulmonary: Sym exp, good air movt, CTAB, no rales, rhonchi, & wheezing  Cardiac: RRR, Nl S1, S2, no Murmurs, rubs or gallops  Vascular: Vessel Right Left  Radial Palpable Palpable  Ulnary Palpable Palpable  Brachial Palpable Palpable  Carotid Palpable, without bruit Palpable, without bruit  Aorta Non-palpable N/A  Femoral Palpable Palpable  Popliteal Non-palpable Non-palpable  PT Palpable Palpable  DP Palpable Palpable   Gastrointestinal: soft, NTND, -G/R, - HSM, - masses, - CVAT B  Musculoskeletal: M/S 5/5 throughout, Extremities without ischemic changes   Neurologic: CN 2-12 intact, Pain and light touch intact in extremities, Motor exam as listed above  Medical Decision Making  [name] is a 65 y.o. female who presents with: asx chronic mesenteric ischemia.   This patient is currently asymptomatic, improved from previosly.  She still need evaluation by a CT surgeon to determine if the lung mass needs further workup.  Once that work up is complete, I would consider proceeding with the aortomesenteric bypass with Dr. Darrick Penna.    If this lung mass represents stage IV cancer, there would be no long term advantage to doing the aortomesenteric bypass.  I discussed in depth with the patient the nature of atherosclerosis, and emphasized the importance of maximal medical management including strict control of blood pressure, blood glucose, and lipid levels,  obtaining regular exercise, and cessation of smoking.  The patient is aware that without maximal medical management the underlying atherosclerotic disease process will progress, limiting the benefit of any interventions.  She is going to follow up with Dr. Shelly Rubenstein and her cardiologist and follow up with me after those appointments.  Thank you for allowing [place] to participate in this patient's care.  Leonides Sake, MD [facility] [place] Office: [phone] Pager: [phone]

## 2011-05-17 LAB — PTH, INTACT AND CALCIUM
Calcium, Total (PTH): 10.3 mg/dL (ref 8.4–10.5)
PTH: 31.3 pg/mL (ref 14.0–72.0)

## 2011-05-18 ENCOUNTER — Ambulatory Visit (INDEPENDENT_AMBULATORY_CARE_PROVIDER_SITE_OTHER): Payer: Medicare Other | Admitting: Vascular Surgery

## 2011-05-18 ENCOUNTER — Encounter: Payer: Self-pay | Admitting: Family Medicine

## 2011-05-18 ENCOUNTER — Encounter: Payer: Self-pay | Admitting: Vascular Surgery

## 2011-05-18 VITALS — BP 126/73 | HR 78 | Resp 12 | Ht 66.0 in | Wt 146.0 lb

## 2011-05-18 DIAGNOSIS — K551 Chronic vascular disorders of intestine: Secondary | ICD-10-CM

## 2011-05-18 NOTE — Progress Notes (Signed)
  Subjective:    Patient ID: [name], female    DOB: [birth year], 65 y.o.   MRN: [id number] This 65 year old white married female with past history of breast cancer of the right breast with followup prosthesis had been doing her well but had elevated LFTs and in working up this problem he was found in she had some mesenteric artery  . She was seen by Dr. Claudie Fisherman regarding surgery and he had scheduled surgery then had to have a violation of a pulmonary nodule in the right chest and to be seen by Dr. Jefm Bryant and the inserter was canceled and the patient on aware why Dr.Buccini cancel the surgery in the workup he was found that the calcium level was elevated and was referred to evaluate possible parathyroid disease Patient has been following him for breast cancer by Dr. Donnie Coffin and has been doing her well She continues to smoke but only very few day GERD symptoms have increased so recommended Prilosec be increased to 1 twice a day The cardiologist recommended increasing simvastatin from 20 mg daily to 40 mg daily Arthritis of the relevant good control with diclofenac 75 mg twice a day also patient has attended on a Synthroid 125 mcg daily    HPI    Review of Systems see history of present illness     Objective:   Physical Exam the patient is a well-built well-nourished attractive pleasant cooperative white female in no distress HEENT no positive finding, thyroid is nonpalpable no lymphadenopathy carotid pulses good Lungs revealed decreased breath sounds with minimal expiratory wheeze no dullness no rales Heart examination not remarkable Breasts examination reveals right breast having operative scars evident mammaplasty firm but no nodules left breast negative Abdominal examination revealed her spleen kidneys are nonpalpable no tenderness normal bowel sounds no masses palpable         Assessment & Plan:  Hypercalcemia to be read checked including parathyroid hormone Mesenteric ischemia or  arterial occlusion to be related away regarding surgery awaiting and discussion with Dr. Matthias Hughs Normal liver function tests in the evaluated GERD increase Prilosec to twice a day Patient appears very healthy and considering the multiple problems that she is confronted with

## 2011-05-18 NOTE — Patient Instructions (Signed)
I will call Dr. Molly Maduro Buccini to find out reason surgery was canceled she been scheduled by Dr. Claudie Fisherman for mesenteric artery surgery Will call you the results of the parathyroid level as well as calcium level when I received blood tests results [name] having increased GERD symptoms increase her Prilosec to one twice a day

## 2011-05-22 NOTE — Progress Notes (Signed)
Pt aware.

## 2011-06-01 ENCOUNTER — Other Ambulatory Visit (INDEPENDENT_AMBULATORY_CARE_PROVIDER_SITE_OTHER): Payer: Medicare Other | Admitting: *Deleted

## 2011-06-01 DIAGNOSIS — I2510 Atherosclerotic heart disease of native coronary artery without angina pectoris: Secondary | ICD-10-CM

## 2011-06-01 DIAGNOSIS — I739 Peripheral vascular disease, unspecified: Secondary | ICD-10-CM

## 2011-06-01 LAB — HEPATIC FUNCTION PANEL
ALT: 34 U/L (ref 0–35)
AST: 29 U/L (ref 0–37)
Albumin: 4.3 g/dL (ref 3.5–5.2)
Alkaline Phosphatase: 83 U/L (ref 39–117)
Bilirubin, Direct: 0.2 mg/dL (ref 0.0–0.3)
Total Bilirubin: 0.6 mg/dL (ref 0.3–1.2)
Total Protein: 7.7 g/dL (ref 6.0–8.3)

## 2011-06-04 ENCOUNTER — Encounter (INDEPENDENT_AMBULATORY_CARE_PROVIDER_SITE_OTHER): Payer: Medicare Other | Admitting: Thoracic Surgery (Cardiothoracic Vascular Surgery)

## 2011-06-04 ENCOUNTER — Other Ambulatory Visit: Payer: Self-pay | Admitting: Thoracic Surgery (Cardiothoracic Vascular Surgery)

## 2011-06-04 DIAGNOSIS — D381 Neoplasm of uncertain behavior of trachea, bronchus and lung: Secondary | ICD-10-CM

## 2011-06-04 DIAGNOSIS — R911 Solitary pulmonary nodule: Secondary | ICD-10-CM

## 2011-06-04 LAB — BUN: BUN: 19 mg/dL (ref 6–23)

## 2011-06-04 LAB — CREATININE, SERUM: Creat: 0.92 mg/dL (ref 0.50–1.10)

## 2011-06-05 NOTE — Assessment & Plan Note (Signed)
OFFICE VISIT  [name], [name] DOB:  1946/08/07                                        June 04, 2011 CHART #:  [id number]  [name] is a 65 year old woman who I had previously operated on in 2006 for right upper lobe mass.  We did a wedge resection and the mass turned out to be granulomatous disease.  This was a 2.3-cm mass at the right apex.  In the interim since then she had breast cancer that was treated with 2 lumpectomies and a mastectomy and chemotherapy.  In May she was having some vascular work done and she has been evaluated for some vascular issues with her colon and during that workup she had a chest x-ray which showed a 1-cm right upper lobe nodule.  She said she kind of ignored that and even though a CT scan was ordered, she cancelled it, thinking that this was just scarring from her previous wedge resection.  PAST MEDICAL HISTORY:  Significant for granulomatous disease of the lung, breast cancer, previous colon surgery, COPD, hypertension.  FAMILY HISTORY:  Significant cardiovascular disease in father and brothers.  CURRENT MEDICATIONS: 1. Amitriptyline 25 mg at bedtime. 2. Amlodipine 10 mg daily. 3. Aspirin 81 mg daily. 4. Wellbutrin 150 mg twice daily. 5. Calcium carbonate 600 mg daily. 6. Cholecalciferol 2000 units daily. 7. Diclofenac 75 mg daily. 8. Synthroid 125 mcg daily. 9. Ativan 2 mg every 8 hours as needed. 10.Toprol 25 mg b.i.d. 11.Omeprazole 40 mg daily. 12.Simvastatin 40 mg at bedtime.  FAMILY HISTORY:  Significant for cardiac disease in brother and father.  SOCIAL HISTORY:  She is married with 2 children.  She is retired.  She started smoking again after she was diagnosed with breast cancer.  She does drink alcohol.  REVIEW OF SYSTEMS:  The patient history form is reviewed and is on the chart.  She notes reflux, hiatal hernia, chest pressure, shortness of breath with exertion, pain in the legs with walking, blood  clots in her veins, blackouts, headaches, arthritis, joint pain, muscle pain, change in hearing and eyesight.  PHYSICAL EXAMINATION:  VITAL SIGNS:  Blood pressure is 159/91, pulse 92, respirations 18, oxygen saturation 90% on room air. GENERAL:  She is well developed and well nourished in no acute distress. She is mildly anxious and neurologically intact. HEENT:  Unremarkable. NECK:  Without palpable thyromegaly or adenopathy. CARDIAC:  Regular rate and rhythm.  Normal S1, S2.  No murmurs, rubs, or gallops. LUNGS:  Clear with equal breath sounds bilaterally. ABDOMEN:  Soft, nontender. EXTREMITIES:  Without clubbing, cyanosis, or edema.  Chest x-ray from May is reviewed.  There is a 1-cm right upper lobe nodule.  IMPRESSION:  [name] is a 65 year old woman with a history of granulomatous disease of the lung as well as breast cancer and now has new lung nodule.  This could be related to either one of those prostheses or could be a new primary lung process.  I recommended to her that we do a CT scan to better evaluate this area before we make any treatment recommendations.  She is willing to proceed with that study. We will schedule that and see her back next week.  Salvatore Decent Dorris Fetch, M.D. Electronically Signed  SCH/MEDQ  D:  06/04/2011  T:  06/05/2011  Job:  [id number]  cc:  Ellin Saba., MD Fransisco Hertz, MD Pierce Crane, M.D., F.R.C.P.C.

## 2011-06-06 ENCOUNTER — Ambulatory Visit
Admission: RE | Admit: 2011-06-06 | Discharge: 2011-06-06 | Disposition: A | Discharge status: Home / Self Care | Payer: Medicare Other | Source: Ambulatory Visit | Attending: Thoracic Surgery (Cardiothoracic Vascular Surgery) | Admitting: Thoracic Surgery (Cardiothoracic Vascular Surgery)

## 2011-06-06 DIAGNOSIS — R911 Solitary pulmonary nodule: Secondary | ICD-10-CM

## 2011-06-06 MED ORDER — IOHEXOL 300 MG/ML  SOLN: 75.0000 mL | Freq: Once | INTRAMUSCULAR | Status: AC | PRN

## 2011-06-11 ENCOUNTER — Encounter: Payer: Self-pay | Admitting: Thoracic Surgery (Cardiothoracic Vascular Surgery)

## 2011-06-11 ENCOUNTER — Ambulatory Visit (INDEPENDENT_AMBULATORY_CARE_PROVIDER_SITE_OTHER): Payer: Medicare Other | Admitting: Thoracic Surgery (Cardiothoracic Vascular Surgery)

## 2011-06-11 VITALS — BP 116/77 | HR 80 | Resp 20 | Ht 66.0 in | Wt 146.0 lb

## 2011-06-11 DIAGNOSIS — J984 Other disorders of lung: Secondary | ICD-10-CM

## 2011-06-11 DIAGNOSIS — D381 Neoplasm of uncertain behavior of trachea, bronchus and lung: Secondary | ICD-10-CM

## 2011-06-11 NOTE — Progress Notes (Signed)
HPI [name] is a 65 year old woman who had a wedge resection for right upper lobe mass and 2006 which turned out to be granulomatous disease. In May she had a chest x-ray which showed a 1 cm right upper lobe nodule. Time she thought this was probably due to scarring from her previous surgery and canceled a CT scan which had been scheduled. She came to the office last week I reviewed her x-rays and this was a new nodule, which was not in the vicinity of her previous resection. I recommended that she have a CT scan in followup today.  Past medical history granulomatous disease right upper lobe COPD  Breast cancer Previous colon surgery Hypertension.  Family history cardiovascular disease in father and brothers, 6 sisters who've all had some type of cancer.  Social history smoking approximately 45-pack-year history had stopped after lung surgery and restarted after diagnosed with breast cancer, quit last week   Current Outpatient Prescriptions  Medication Sig Dispense Refill  . amitriptyline (ELAVIL) 25 MG tablet Take 25 mg by mouth at bedtime.       Marland Kitchen amLODipine (NORVASC) 10 MG tablet Take 10 mg by mouth daily.        Marland Kitchen aspirin 81 MG tablet Take 81 mg by mouth daily.        . B Complex Vitamins (VITAMIN-B COMPLEX PO) Take by mouth daily.        Marland Kitchen buPROPion (WELLBUTRIN SR) 150 MG 12 hr tablet Take one tablet by mouth every AM for 3 days then one tablet by mouth twice daily for 3 months  60 tablet  3  . Calcium Carbonate-Vitamin D (CALCIUM + D PO) Take 600 mg by mouth daily.       . Cholecalciferol (VITAMIN D-3 PO) Take 2,000 Units by mouth daily.       . diclofenac (VOLTAREN) 75 MG EC tablet Take 75 mg by mouth daily.       Marland Kitchen levothyroxine (SYNTHROID, LEVOTHROID) 125 MCG tablet Take 125 mcg by mouth daily.        Marland Kitchen LORazepam (ATIVAN) 2 MG tablet Take 2 mg by mouth every 8 (eight) hours as needed.        . metoprolol tartrate (LOPRESSOR) 25 MG tablet Take 25 mg by mouth 2 (two) times  daily.        Marland Kitchen omeprazole (PRILOSEC) 40 MG capsule Take 40 mg by mouth daily.       . simvastatin (ZOCOR) 40 MG tablet Take 1 tablet (40 mg total) by mouth every evening.  30 tablet  2      Physical Exam BP 116/77  Pulse 80  Resp 20  Ht 5\' 6"  (1.676 m)  Wt 146 lb (66.225 kg)  BMI 23.56 kg/m2  SpO2 92% Neuro alert and oriented x3 no deficit HEENT unremarkable Neck no adenopathy Cardiac regular rate and rhythm normal S1-S2 no rubs or murmurs Lungs bronchial breath sounds at both bases no rales or wheezing, Abdomen soft nontender Extremities without clubbing cyanosis or edema  Diagnostic tests: CT of the chest is reviewed 8.6 mm lesion lateral aspect right upper lobe, multiple smaller non-distinct 2-4 mm long nodules bilaterally no mediastinal or hilar adenopathy  Impression: New right upper lobe nodule a 8.6 mm, suspicious for neoplasm. Possibilities include new primary lung cancer or metastatic breast cancer. Other possibilities include inflammatory or granulomatous disease. I had a long discussion with [name] regarding her options which include PET scanning, followed with serial CT scans, or  surgical resection for excisional biopsy and definitive treatment. Given the size of this lesion PET scan is unlikely to be particularly helpful as the nodules at the limits of resolution of her PET scanning, so a negative PET would not rule out malignancy, and a positive PET COULD not differentiate between malignancy granulomatous disease. The obvious drawback 2 serial CT scanning is that this is malignancy with 23 additional months before being rechecked and could potentially grow were spread and that time period. Surgery on the other hand would give [place] a definitive diagnosis.  I discussed the indications risks and benefits of the procedure, general anesthesia, expected hospital stay, and overall recovery. She understands that we will attempt to do this minimally invasively, but given that  she's had previous surgery in that region adhesions may require that we do an open procedure, potentially including cutting or removing a portion of the rib. She understands that the risk include but are not limited to death, stroke, MI, bleeding, possible need for blood transfusion, infection, prolonged air leakage, or other unforeseeable complications. She understands these risks accepts them and wishes to proceed with surgery as soon as possible she is very concerned about the possibility of this being a malignancy.  Plan: We will schedule [name] for right VATS, possible right thoracotomy and wedge resection on Thursday, August 30. She will be admitted on the day of surgery. Given the small size of this lesion and its peripheral location I do think that even at this turned out to be a primary lung cancer that a wedge resection with good margins would be an adequate procedure along with node dissection, I do not think she would require lobectomy

## 2011-06-12 ENCOUNTER — Other Ambulatory Visit: Payer: Self-pay | Admitting: Thoracic Surgery (Cardiothoracic Vascular Surgery)

## 2011-06-12 ENCOUNTER — Ambulatory Visit (HOSPITAL_COMMUNITY)
Admission: RE | Admit: 2011-06-12 | Discharge: 2011-06-12 | Disposition: A | Discharge status: Home / Self Care | Payer: Medicare Other | Source: Ambulatory Visit | Attending: Thoracic Surgery (Cardiothoracic Vascular Surgery) | Admitting: Thoracic Surgery (Cardiothoracic Vascular Surgery)

## 2011-06-12 ENCOUNTER — Encounter (HOSPITAL_COMMUNITY)
Admission: RE | Admit: 2011-06-12 | Discharge: 2011-06-12 | Disposition: A | Discharge status: Home / Self Care | Payer: Medicare Other | Source: Ambulatory Visit | Attending: Thoracic Surgery (Cardiothoracic Vascular Surgery) | Admitting: Thoracic Surgery (Cardiothoracic Vascular Surgery)

## 2011-06-12 DIAGNOSIS — J984 Other disorders of lung: Secondary | ICD-10-CM | POA: Insufficient documentation

## 2011-06-12 DIAGNOSIS — Z01811 Encounter for preprocedural respiratory examination: Secondary | ICD-10-CM | POA: Insufficient documentation

## 2011-06-12 DIAGNOSIS — R911 Solitary pulmonary nodule: Secondary | ICD-10-CM

## 2011-06-12 DIAGNOSIS — Z01812 Encounter for preprocedural laboratory examination: Secondary | ICD-10-CM | POA: Insufficient documentation

## 2011-06-12 DIAGNOSIS — J4489 Other specified chronic obstructive pulmonary disease: Secondary | ICD-10-CM | POA: Insufficient documentation

## 2011-06-12 DIAGNOSIS — J449 Chronic obstructive pulmonary disease, unspecified: Secondary | ICD-10-CM | POA: Insufficient documentation

## 2011-06-12 LAB — PROTIME-INR
INR: 0.94 (ref 0.00–1.49)
Prothrombin Time: 12.8 seconds (ref 11.6–15.2)

## 2011-06-12 LAB — CBC
HCT: 44.2 % (ref 36.0–46.0)
Hemoglobin: 15.6 g/dL — ABNORMAL HIGH (ref 12.0–15.0)
MCH: 31.5 pg (ref 26.0–34.0)
MCHC: 35.3 g/dL (ref 30.0–36.0)
MCV: 89.1 fL (ref 78.0–100.0)
Platelets: 360 10*3/uL (ref 150–400)
RBC: 4.96 MIL/uL (ref 3.87–5.11)
RDW: 14.1 % (ref 11.5–15.5)
WBC: 5.3 10*3/uL (ref 4.0–10.5)

## 2011-06-12 LAB — COMPREHENSIVE METABOLIC PANEL
ALT: 34 U/L (ref 0–35)
AST: 24 U/L (ref 0–37)
Albumin: 4.1 g/dL (ref 3.5–5.2)
Alkaline Phosphatase: 101 U/L (ref 39–117)
BUN: 17 mg/dL (ref 6–23)
CO2: 23 mEq/L (ref 19–32)
Calcium: 10.4 mg/dL (ref 8.4–10.5)
Chloride: 102 mEq/L (ref 96–112)
Creatinine, Ser: 0.74 mg/dL (ref 0.50–1.10)
GFR calc Af Amer: >60 mL/min (ref >60)
GFR calc non Af Amer: >60 mL/min (ref >60)
Glucose, Bld: 83 mg/dL (ref 70–99)
Potassium: 4.6 mEq/L (ref 3.5–5.1)
Sodium: 137 mEq/L (ref 135–145)
Total Bilirubin: 0.4 mg/dL (ref 0.3–1.2)
Total Protein: 7.7 g/dL (ref 6.0–8.3)

## 2011-06-12 LAB — URINALYSIS, ROUTINE W REFLEX MICROSCOPIC
Bilirubin Urine: NEGATIVE
Glucose, UA: NEGATIVE mg/dL
Hgb urine dipstick: NEGATIVE
Ketones, ur: NEGATIVE mg/dL
Leukocytes, UA: NEGATIVE
Nitrite: NEGATIVE
Protein, ur: NEGATIVE mg/dL
Specific Gravity, Urine: 1.004 — ABNORMAL LOW (ref 1.005–1.030)
Urobilinogen, UA: 0.2 mg/dL (ref 0.0–1.0)
pH: 6 (ref 5.0–8.0)

## 2011-06-12 LAB — TYPE AND SCREEN
ABO/RH(D): A POS
Antibody Screen: NEGATIVE

## 2011-06-12 LAB — BLOOD GAS, ARTERIAL
Acid-base deficit: 0.3 mmol/L (ref 0.0–2.0)
Bicarbonate: 23.6 mEq/L (ref 20.0–24.0)
Drawn by: 181601
FIO2: 0.21 %
O2 Saturation: 96.5 %
Patient temperature: 98.6
TCO2: 24.8 mmol/L (ref 0–100)
pCO2 arterial: 37.3 mmHg (ref 35.0–45.0)
pH, Arterial: 7.418 — ABNORMAL HIGH (ref 7.350–7.400)
pO2, Arterial: 81.2 mmHg (ref 80.0–100.0)

## 2011-06-12 LAB — APTT: aPTT: 29 seconds (ref 24–37)

## 2011-06-12 LAB — ABO/RH: ABO/RH(D): A POS

## 2011-06-12 LAB — SURGICAL PCR SCREEN
MRSA, PCR: NEGATIVE
Staphylococcus aureus: NEGATIVE

## 2011-06-14 ENCOUNTER — Other Ambulatory Visit: Payer: Self-pay | Admitting: Thoracic Surgery (Cardiothoracic Vascular Surgery)

## 2011-06-14 ENCOUNTER — Inpatient Hospital Stay (HOSPITAL_COMMUNITY): Payer: Medicare Other

## 2011-06-14 ENCOUNTER — Inpatient Hospital Stay (HOSPITAL_COMMUNITY)
Admission: RE | Admit: 2011-06-14 | Discharge: 2011-06-20 | DRG: 164 | Disposition: A | Discharge status: Home / Self Care | Payer: Medicare Other | Source: Ambulatory Visit | Attending: Thoracic Surgery (Cardiothoracic Vascular Surgery) | Admitting: Thoracic Surgery (Cardiothoracic Vascular Surgery)

## 2011-06-14 DIAGNOSIS — J8410 Pulmonary fibrosis, unspecified: Principal | ICD-10-CM | POA: Diagnosis present

## 2011-06-14 DIAGNOSIS — I998 Other disorder of circulatory system: Secondary | ICD-10-CM | POA: Diagnosis present

## 2011-06-14 DIAGNOSIS — D381 Neoplasm of uncertain behavior of trachea, bronchus and lung: Secondary | ICD-10-CM

## 2011-06-14 DIAGNOSIS — J95812 Postprocedural air leak: Secondary | ICD-10-CM | POA: Diagnosis not present

## 2011-06-14 DIAGNOSIS — I10 Essential (primary) hypertension: Secondary | ICD-10-CM | POA: Diagnosis present

## 2011-06-14 DIAGNOSIS — Z853 Personal history of malignant neoplasm of breast: Secondary | ICD-10-CM

## 2011-06-14 DIAGNOSIS — Y849 Medical procedure, unspecified as the cause of abnormal reaction of the patient, or of later complication, without mention of misadventure at the time of the procedure: Secondary | ICD-10-CM | POA: Diagnosis not present

## 2011-06-14 DIAGNOSIS — J449 Chronic obstructive pulmonary disease, unspecified: Secondary | ICD-10-CM | POA: Diagnosis present

## 2011-06-14 DIAGNOSIS — J4489 Other specified chronic obstructive pulmonary disease: Secondary | ICD-10-CM | POA: Diagnosis present

## 2011-06-14 LAB — POCT I-STAT 7, (LYTES, BLD GAS, ICA,H+H)
Acid-base deficit: 3 mmol/L — ABNORMAL HIGH (ref 0.0–2.0)
Acid-base deficit: 3 mmol/L — ABNORMAL HIGH (ref 0.0–2.0)
Acid-base deficit: 1 mmol/L (ref 0.0–2.0)
Bicarbonate: 30.4 mEq/L — ABNORMAL HIGH (ref 20.0–24.0)
Bicarbonate: 30.1 mEq/L — ABNORMAL HIGH (ref 20.0–24.0)
Bicarbonate: 30.3 mEq/L — ABNORMAL HIGH (ref 20.0–24.0)
Calcium, Ion: 1.3 mmol/L (ref 1.12–1.32)
Calcium, Ion: 1.32 mmol/L (ref 1.12–1.32)
Calcium, Ion: 1.24 mmol/L (ref 1.12–1.32)
HCT: 45 % (ref 36.0–46.0)
HCT: 44 % (ref 36.0–46.0)
HCT: 42 % (ref 36.0–46.0)
Hemoglobin: 15.3 g/dL — ABNORMAL HIGH (ref 12.0–15.0)
Hemoglobin: 15 g/dL (ref 12.0–15.0)
Hemoglobin: 14.3 g/dL (ref 12.0–15.0)
O2 Saturation: 89 %
O2 Saturation: 80 %
O2 Saturation: 81 %
Potassium: 5.5 mEq/L — ABNORMAL HIGH (ref 3.5–5.1)
Potassium: 5.6 mEq/L — ABNORMAL HIGH (ref 3.5–5.1)
Potassium: 5.2 mEq/L — ABNORMAL HIGH (ref 3.5–5.1)
Sodium: 139 mEq/L (ref 135–145)
Sodium: 140 mEq/L (ref 135–145)
Sodium: 140 mEq/L (ref 135–145)
TCO2: 34 mmol/L (ref 0–100)
TCO2: 33 mmol/L (ref 0–100)
TCO2: 33 mmol/L (ref 0–100)
pCO2 arterial: 105 mmHg (ref 35.0–45.0)
pCO2 arterial: 100.8 mmHg (ref 35.0–45.0)
pCO2 arterial: 88.7 mmHg (ref 35.0–45.0)
pH, Arterial: 7.07 — CL (ref 7.350–7.400)
pH, Arterial: 7.083 — CL (ref 7.350–7.400)
pH, Arterial: 7.141 — CL (ref 7.350–7.400)
pO2, Arterial: 84 mmHg (ref 80.0–100.0)
pO2, Arterial: 65 mmHg — ABNORMAL LOW (ref 80.0–100.0)
pO2, Arterial: 62 mmHg — ABNORMAL LOW (ref 80.0–100.0)

## 2011-06-14 LAB — GLUCOSE, CAPILLARY: Glucose-Capillary: 116 mg/dL — ABNORMAL HIGH (ref 70–99)

## 2011-06-14 LAB — BLOOD GAS, ARTERIAL
Acid-base deficit: 1.7 mmol/L (ref 0.0–2.0)
Bicarbonate: 30.2 mEq/L — ABNORMAL HIGH (ref 20.0–24.0)
Drawn by: 29757
O2 Content: 15 L/min
O2 Saturation: 89.9 %
Patient temperature: 98.6
TCO2: 34.7 mmol/L (ref 0–100)
pCO2 arterial: 148 mmHg (ref 35.0–45.0)
pH, Arterial: 6.939 — CL (ref 7.350–7.400)
pO2, Arterial: 86.2 mmHg (ref 80.0–100.0)

## 2011-06-14 LAB — POCT I-STAT 3, ART BLOOD GAS (G3+)
Acid-Base Excess: 3 mmol/L — ABNORMAL HIGH (ref 0.0–2.0)
Bicarbonate: 29.9 mEq/L — ABNORMAL HIGH (ref 20.0–24.0)
Bicarbonate: 26.9 mEq/L — ABNORMAL HIGH (ref 20.0–24.0)
O2 Saturation: 92 %
O2 Saturation: 94 %
Patient temperature: 98.7
Patient temperature: 98.7
TCO2: 32 mmol/L (ref 0–100)
TCO2: 28 mmol/L (ref 0–100)
pCO2 arterial: 57.7 mmHg (ref 35.0–45.0)
pCO2 arterial: 50.6 mmHg — ABNORMAL HIGH (ref 35.0–45.0)
pH, Arterial: 7.322 — ABNORMAL LOW (ref 7.350–7.400)
pH, Arterial: 7.333 — ABNORMAL LOW (ref 7.350–7.400)
pO2, Arterial: 71 mmHg — ABNORMAL LOW (ref 80.0–100.0)
pO2, Arterial: 77 mmHg — ABNORMAL LOW (ref 80.0–100.0)

## 2011-06-14 LAB — CBC
HCT: 33.8 % — ABNORMAL LOW (ref 36.0–46.0)
Hemoglobin: 11.1 g/dL — ABNORMAL LOW (ref 12.0–15.0)
MCH: 30.5 pg (ref 26.0–34.0)
MCHC: 32.8 g/dL (ref 30.0–36.0)
MCV: 92.9 fL (ref 78.0–100.0)
Platelets: 270 10*3/uL (ref 150–400)
RBC: 3.64 MIL/uL — ABNORMAL LOW (ref 3.87–5.11)
RDW: 14.4 % (ref 11.5–15.5)
WBC: 11.6 10*3/uL — ABNORMAL HIGH (ref 4.0–10.5)

## 2011-06-15 ENCOUNTER — Inpatient Hospital Stay (HOSPITAL_COMMUNITY): Payer: Medicare Other

## 2011-06-15 LAB — BASIC METABOLIC PANEL
BUN: 18 mg/dL (ref 6–23)
CO2: 26 mEq/L (ref 19–32)
Calcium: 9 mg/dL (ref 8.4–10.5)
Chloride: 103 mEq/L (ref 96–112)
Creatinine, Ser: 0.7 mg/dL (ref 0.50–1.10)
GFR calc Af Amer: >60 mL/min (ref >60)
GFR calc non Af Amer: >60 mL/min (ref >60)
Glucose, Bld: 137 mg/dL — ABNORMAL HIGH (ref 70–99)
Potassium: 4 mEq/L (ref 3.5–5.1)
Sodium: 136 mEq/L (ref 135–145)

## 2011-06-15 LAB — POCT I-STAT 3, ART BLOOD GAS (G3+)
Acid-Base Excess: 4 mmol/L — ABNORMAL HIGH (ref 0.0–2.0)
Bicarbonate: 29 mEq/L — ABNORMAL HIGH (ref 20.0–24.0)
O2 Saturation: 96 %
Patient temperature: 98.7
TCO2: 30 mmol/L (ref 0–100)
pCO2 arterial: 46.6 mmHg — ABNORMAL HIGH (ref 35.0–45.0)
pH, Arterial: 7.402 — ABNORMAL HIGH (ref 7.350–7.400)
pO2, Arterial: 84 mmHg (ref 80.0–100.0)

## 2011-06-15 LAB — CBC
HCT: 33.9 % — ABNORMAL LOW (ref 36.0–46.0)
Hemoglobin: 11.3 g/dL — ABNORMAL LOW (ref 12.0–15.0)
MCH: 30.4 pg (ref 26.0–34.0)
MCHC: 33.3 g/dL (ref 30.0–36.0)
MCV: 91.1 fL (ref 78.0–100.0)
Platelets: 263 10*3/uL (ref 150–400)
RBC: 3.72 MIL/uL — ABNORMAL LOW (ref 3.87–5.11)
RDW: 14.2 % (ref 11.5–15.5)
WBC: 8.7 10*3/uL (ref 4.0–10.5)

## 2011-06-16 ENCOUNTER — Inpatient Hospital Stay (HOSPITAL_COMMUNITY): Payer: Medicare Other

## 2011-06-16 LAB — COMPREHENSIVE METABOLIC PANEL WITH GFR
ALT: 26 U/L (ref 0–35)
AST: 34 U/L (ref 0–37)
Albumin: 3.3 g/dL — ABNORMAL LOW (ref 3.5–5.2)
Alkaline Phosphatase: 66 U/L (ref 39–117)
BUN: 10 mg/dL (ref 6–23)
CO2: 32 meq/L (ref 19–32)
Calcium: 9.4 mg/dL (ref 8.4–10.5)
Chloride: 101 meq/L (ref 96–112)
Creatinine, Ser: 0.56 mg/dL (ref 0.50–1.10)
GFR calc Af Amer: >60 mL/min
GFR calc non Af Amer: >60 mL/min
Glucose, Bld: 104 mg/dL — ABNORMAL HIGH (ref 70–99)
Potassium: 3.7 meq/L (ref 3.5–5.1)
Sodium: 139 meq/L (ref 135–145)
Total Bilirubin: 0.4 mg/dL (ref 0.3–1.2)
Total Protein: 6.4 g/dL (ref 6.0–8.3)

## 2011-06-16 LAB — CBC
HCT: 37 % (ref 36.0–46.0)
Hemoglobin: 12.2 g/dL (ref 12.0–15.0)
MCH: 30.3 pg (ref 26.0–34.0)
MCHC: 33 g/dL (ref 30.0–36.0)
MCV: 92 fL (ref 78.0–100.0)
Platelets: 294 10*3/uL (ref 150–400)
RBC: 4.02 MIL/uL (ref 3.87–5.11)
RDW: 13.9 % (ref 11.5–15.5)
WBC: 8.2 10*3/uL (ref 4.0–10.5)

## 2011-06-16 LAB — EXPECTORATED SPUTUM ASSESSMENT W GRAM STAIN, RFLX TO RESP C

## 2011-06-17 ENCOUNTER — Inpatient Hospital Stay (HOSPITAL_COMMUNITY): Payer: Medicare Other

## 2011-06-17 LAB — CBC
HCT: 35.2 % — ABNORMAL LOW (ref 36.0–46.0)
Hemoglobin: 11.8 g/dL — ABNORMAL LOW (ref 12.0–15.0)
MCH: 30.4 pg (ref 26.0–34.0)
MCHC: 33.5 g/dL (ref 30.0–36.0)
MCV: 90.7 fL (ref 78.0–100.0)
Platelets: 303 10*3/uL (ref 150–400)
RBC: 3.88 MIL/uL (ref 3.87–5.11)
RDW: 13.6 % (ref 11.5–15.5)
WBC: 8.6 10*3/uL (ref 4.0–10.5)

## 2011-06-17 LAB — BASIC METABOLIC PANEL
BUN: 10 mg/dL (ref 6–23)
CO2: 32 mEq/L (ref 19–32)
Calcium: 9.2 mg/dL (ref 8.4–10.5)
Chloride: 97 mEq/L (ref 96–112)
Creatinine, Ser: 0.47 mg/dL — ABNORMAL LOW (ref 0.50–1.10)
GFR calc Af Amer: >60 mL/min (ref >60)
GFR calc non Af Amer: >60 mL/min (ref >60)
Glucose, Bld: 101 mg/dL — ABNORMAL HIGH (ref 70–99)
Potassium: 3.1 mEq/L — ABNORMAL LOW (ref 3.5–5.1)
Sodium: 136 mEq/L (ref 135–145)

## 2011-06-18 ENCOUNTER — Inpatient Hospital Stay (HOSPITAL_COMMUNITY): Payer: Medicare Other

## 2011-06-18 LAB — POCT I-STAT 7, (LYTES, BLD GAS, ICA,H+H)
Acid-base deficit: 3 mmol/L — ABNORMAL HIGH (ref 0.0–2.0)
Bicarbonate: 28.8 mEq/L — ABNORMAL HIGH (ref 20.0–24.0)
Calcium, Ion: 1.26 mmol/L (ref 1.12–1.32)
HCT: 41 % (ref 36.0–46.0)
Hemoglobin: 13.9 g/dL (ref 12.0–15.0)
O2 Saturation: 83 %
Potassium: 5.7 mEq/L — ABNORMAL HIGH (ref 3.5–5.1)
Sodium: 140 mEq/L (ref 135–145)
TCO2: 31 mmol/L (ref 0–100)
pCO2 arterial: 89.4 mmHg (ref 35.0–45.0)
pH, Arterial: 7.116 — CL (ref 7.350–7.400)
pO2, Arterial: 65 mmHg — ABNORMAL LOW (ref 80.0–100.0)

## 2011-06-18 LAB — CBC
HCT: 36 % (ref 36.0–46.0)
Hemoglobin: 12.5 g/dL (ref 12.0–15.0)
MCH: 30.9 pg (ref 26.0–34.0)
MCHC: 34.7 g/dL (ref 30.0–36.0)
MCV: 89.1 fL (ref 78.0–100.0)
Platelets: 353 10*3/uL (ref 150–400)
RBC: 4.04 MIL/uL (ref 3.87–5.11)
RDW: 13.3 % (ref 11.5–15.5)
WBC: 7.5 10*3/uL (ref 4.0–10.5)

## 2011-06-18 LAB — BASIC METABOLIC PANEL
BUN: 14 mg/dL (ref 6–23)
CO2: 31 mEq/L (ref 19–32)
Calcium: 9.2 mg/dL (ref 8.4–10.5)
Chloride: 99 mEq/L (ref 96–112)
Creatinine, Ser: 0.57 mg/dL (ref 0.50–1.10)
GFR calc Af Amer: >60 mL/min (ref >60)
GFR calc non Af Amer: >60 mL/min (ref >60)
Glucose, Bld: 91 mg/dL (ref 70–99)
Potassium: 3.5 mEq/L (ref 3.5–5.1)
Sodium: 136 mEq/L (ref 135–145)

## 2011-06-18 LAB — CULTURE, RESPIRATORY

## 2011-06-18 LAB — CULTURE, RESPIRATORY W GRAM STAIN: Culture: NORMAL

## 2011-06-19 ENCOUNTER — Inpatient Hospital Stay (HOSPITAL_COMMUNITY): Payer: Medicare Other

## 2011-06-20 ENCOUNTER — Inpatient Hospital Stay (HOSPITAL_COMMUNITY): Payer: Medicare Other

## 2011-06-22 ENCOUNTER — Other Ambulatory Visit: Payer: Self-pay | Admitting: Oncology

## 2011-06-22 ENCOUNTER — Ambulatory Visit: Payer: Medicare Other | Admitting: Vascular Surgery

## 2011-06-22 ENCOUNTER — Encounter: Payer: Medicare Other | Admitting: Oncology

## 2011-06-22 LAB — HEPATIC FUNCTION PANEL
ALT: 29 U/L (ref 0–35)
AST: 19 U/L (ref 0–37)
Albumin: 3.8 g/dL (ref 3.5–5.2)
Alkaline Phosphatase: 74 U/L (ref 39–117)
Bilirubin, Direct: 0.2 mg/dL (ref 0.0–0.3)
Indirect Bilirubin: 0.4 mg/dL (ref 0.0–0.9)
Total Bilirubin: 0.6 mg/dL (ref 0.3–1.2)
Total Protein: 6.5 g/dL (ref 6.0–8.3)

## 2011-06-25 ENCOUNTER — Other Ambulatory Visit: Payer: Medicare Other | Admitting: *Deleted

## 2011-06-25 ENCOUNTER — Encounter (INDEPENDENT_AMBULATORY_CARE_PROVIDER_SITE_OTHER): Payer: Self-pay

## 2011-06-25 DIAGNOSIS — D381 Neoplasm of uncertain behavior of trachea, bronchus and lung: Secondary | ICD-10-CM

## 2011-06-26 NOTE — Discharge Summary (Signed)
NAMEPRESSLEY, BARSKY NO.:  [id number]  MEDICAL RECORD NO.:  [id number]  LOCATION:  3314                         FACILITY:  [facility]  PHYSICIAN:  Salvatore Decent. Dorris Fetch, M.D.DATE OF BIRTH:  1946-04-03  DATE OF ADMISSION:  06/14/2011 DATE OF DISCHARGE:  06/20/2011                              DISCHARGE SUMMARY   ADMITTING DIAGNOSES: 1. Right upper lobe nodule. 2. History of granulomatous disease (status post right video-assisted     thoracoscopic surgery wedge resection of right upper lobe in     November 2006). 3. History of right breast cancer (status post lumpectomies,     mastectomy, chemotherapy, and right breast reconstruction). 4. History of chronic obstructive pulmonary disease. 5. History of hypertension. 6. Chronic mesenteric ischemia.  DISCHARGE DIAGNOSES: 1. Right upper lobe nodule. 2. History of granulomatous disease (status post right video-assisted     thoracoscopic surgery wedge resection of right upper lobe in     November 2006). 3. History of right breast cancer (status post lumpectomies,     mastectomy, chemotherapy, and right breast reconstruction). 4. History of chronic obstructive pulmonary disease. 5. History of hypertension. 6. Chronic mesenteric ischemia.  PROCEDURE:  Right VATS, wedge resection of right upper lobe nodule by Dr. Dorris Fetch on June 14, 2011.  PATHOLOGY RESULTS:  Frozen-indicated granulomatous disease.  Final pathology pending.  HISTORY OF PRESENTING ILLNESS:  This is a 65 year old Caucasian female, known to Dr. Dorris Fetch from previous right VATS wedge resection of right upper lobe mass in November 2006.  Pathology at that time indicated necrotizing granulomatous inflammation (no malignancy).  In the interim, the patient has been treated for right breast cancer (status post 2 lumpectomies, mastectomy, chemotherapy, and reconstruction).  In May 2012, she was then found to have chronic mesenteric  ischemia. During that workup, a chest x-ray that was done showed another 1-cm right upper lobe nodule.  Apparently, a CAT scan was recommended, however, the patient postponed this.  She was then seen in the office in consultation by Dr. Dorris Fetch on June 04, 2011, who again recommended a CAT scan in order to further evaluate the right upper lobe nodule.  This was done on June 06, 2011.  It showed bilateral pulmonary nodules with a dominant spiculated 0.9-cm right upper lobe nodule (new since prior study).  Dr. Dorris Fetch then had a discussion with the patient regarding the necessitation for undergoing a right VATS and wedge resection of the right upper lobe nodule in order to determine pathology.  Potential risks, complications, and benefits of the surgery were discussed with the patient, she agreed to proceed.  She was admitted to [facility] on June 14, 2011, in order to undergo right VATS, wedge resection of the right upper lobe nodule by Dr. Dorris Fetch.  BRIEF HOSPITAL COURSE STAY:  The patient remained intubated until the day following surgery.  She remained afebrile and hemodynamically stable.  Her A-line was removed on June 16, 2011.  Foley was also removed during her postoperative course.  She did remain in the intensive care unit for a couple days postoperatively and was transferred to 3200 for further convalescence on June 18, 2011.  Her chest tube was found to  have a small intermittent air leak.  It was placed to water-seal on June 18, 2011.  Daily chest x-rays were obtained and showed a stable small right apical pneumothorax.  On postop day #5, she was not found to have an obvious air leak and her chest tube are most likely be removed today with a followup chest x-ray to be obtained in the a.m.  PHYSICAL EXAMINATION:  VITAL SIGNS:  Currently on this date, she is afebrile, heart rate in the 60s, BP 130/70, O2 sat 96% on room air. CARDIOVASCULAR:  Regular  rate and rhythm. PULMONARY:  Clear. ABDOMEN:  Soft, nontender.  Bowel sounds present. EXTREMITIES:  No lower extremity edema.  Her right chest wounds are clean, dry, and continuing to heal.  There is some serous drainage around the chest tube site.  No signs of infection.  The patient had already been tolerating a diet and has had a bowel movement.  She remains afebrile, hemodynamically stable, and her chest x-ray remains stable and pending morning round evaluation.  She is surgically stable for discharge on June 20, 2011.  Latest laboratory studies are as follows.  Final respiratory culture shows Gram variable rods.  Normal laryngeal, pharyngeal flora.  Last CBC done on June 18, 2011, H and H 12.5 and 36, white count 7500, platelet count 253,000.  Last BMET on this date; potassium 3.5 which has been supplemented, sodium 136, BUN and creatinine 14 and 0.57 respectively.  Last chest x-ray done on June 19, 2011, showed stable right apical pneumothorax, postsurgical changes in the right upper lobe of the lung.  DISCHARGE INSTRUCTIONS:  As follows.  DIET:  Low sodium, heart healthy.  ACTIVITY:  The patient may shower.  She may walk up steps.  She is not to lift more than 10 pounds for 2 weeks.  She is not to drive until after 2 weeks.  She is to continue with breathing exercise daily.  She is to walk daily and increase her frequency of duration as tolerates.  WOUND CARE:  Use soap and water on her wounds.  She is to contact the office if any wound problems arise.  Her followup appointment includes to see Dr. Dorris Fetch physician's assistant on July 02, 2011, at 3:45 p.m.  Chest x-ray should be taken no later than 3 p.m. prior to this appointment.  DISCHARGE MEDICATIONS:  Include the following: 1. Biotene 15 mL rinse twice daily. 2. Ultram 50 mg 1-2 tabs p.o. q.6 hours p.r.n. pain. 3. Bupropion SR 150 mg p.o. 2 times daily. 4. Enteric-coated aspirin 81 mg p.o.  daily. 5. Amitriptyline 25 mg p.o. at bedtime. 6. Amlodipine 10 mg p.o. daily. 7. Calcium carbonate/vitamin D over the counter p.o. daily. 8. Cholecalciferol 2000 units p.o. daily. 9. Levothyroxine 125 mcg p.o. daily. 10.Lorazepam 2 mg p.o. q.8 hours p.r.n. anxiety. 11.Metoprolol tartrate 50 mg p.o. daily. 12.Omeprazole 40 mg p.o. daily. 13.Simvastatin 40 mg p.o. at bedtime. 14.Vitamin B complex with C 1 tablet p.o. daily.     Doree Fudge, PA   ______________________________ Salvatore Decent Dorris Fetch, M.D.    DZ/MEDQ  D:  06/19/2011  T:  06/19/2011  Job:  [id number]  cc:   Ellin Saba., MD Dr. Imogene Burn  Electronically Signed by Doree Fudge PA on 06/19/2011 11:00:23 AM Electronically Signed by Charlett Lango M.D. on 06/26/2011 04:16:31 PM

## 2011-06-26 NOTE — Op Note (Signed)
[name], [name] NO.:  [id number]  MEDICAL RECORD NO.:  [id number]  LOCATION:  2550                         FACILITY:  [facility]  PHYSICIAN:  Salvatore Decent. Dorris Fetch, M.D.DATE OF BIRTH:  08-01-1946  DATE OF PROCEDURE:  06/14/2011 DATE OF DISCHARGE:                              OPERATIVE REPORT   PREOPERATIVE DIAGNOSIS:  New right upper lobe nodule.  POSTOPERATIVE DIAGNOSIS:  Granulomatous disease.  PROCEDURE:  Right video-assisted thoracoscopy, wedge resection, right upper lobe nodule.  SURGEON:  Salvatore Decent. Dorris Fetch, MD  ASSISTANT:  Rowe Clack, PA-C  ANESTHESIA:  General.  FINDINGS:  Approximately 1 cm nodule, lateral aspect of right upper lobe, frozen section revealed benign lesion, likely granulomatous disease.  CLINICAL NOTE:  [name] is a 65 year old woman with previous history of right upper lobe wedge resection for granulomatous disease in 2006. In the interim since then, she has had breast cancer, has been treated with two lumpectomies followed by mastectomy and chemotherapy.  In May, she had a chest x-ray, which showed a new right upper lobe nodule.  A CT scan revealed a 9-mm rounded, but partially spiculated nodule suspicious for malignancy.  Options of surgical resection versus further evaluation with PET scan versus serial CT scan followup were discussed with the patient, and it was felt that given her history of smoking as well as her history of breast cancer, resection for excisional biopsy and definitive management was her most appropriate course of treatment.  She understood and accepted the risks of surgery and agreed to proceed.  OPERATIVE NOTE:  [name] was brought to the preop holding area on June 14, 2011, there the Anesthesia Service placed a central line and an arterial blood pressure monitoring line.  PAS hose were placed for DVT prophylaxis.  She was taken to the operating room, anesthetized and intubated with a  double-lumen endotracheal tube.  A Foley catheter was placed.  She was placed in the left lateral decubitus position and the right chest was prepped and draped in usual sterile fashion.  Incision was made approximately at the seventh intercostal space and midaxillary line was carried through the skin and subcutaneous tissue.  The chest was entered bluntly using hemostat.  Thoracoscope was inserted.  There were some minimal adhesions superiorly and laterally.  Additional port incisions were made anteriorly and posteriorly in the fifth intercostal space.  Some adhesions of the middle lobe to the previous port incision were taken down to allow placement of the anterior incision.  The right upper lobe was grasped and a nodule was palpated in the lateral aspect corresponding to the findings on CT scan.  This was resected with sequential firings with an Endo-GIA stapler and removed through the posterior incision.  There was good hemostasis at the staple lines. Specimen was palpated to ensure that the nodule was in fact present andthen sent for frozen section.  The frozen section returned benign granulomatous disease.  No evidence of tumor seen.  ProGEL was applied to the staple line.  A 28-French chest tube was placed through the midaxillary line and incision secured with a #1 silk suture.  The remaining two incisions were closed with 0  Vicryl fascial sutures and 3-0 Vicryl subcuticular sutures.  All sponge, needle and sponge counts were correct at the end of the procedure.  The patient was extubated in the operating room and taken to the postanesthetic care unit in good condition.     Salvatore Decent Dorris Fetch, M.D.     SCH/MEDQ  D:  06/14/2011  T:  06/14/2011  Job:  [id number]  cc:   Ellin Saba., MD Pierce Crane, M.D., F.R.C.P.C. Fransisco Hertz, MD  Electronically Signed by Charlett Lango M.D. on 06/26/2011 04:16:29 PM

## 2011-06-27 ENCOUNTER — Ambulatory Visit: Payer: Medicare Other | Admitting: Cardiology

## 2011-07-04 ENCOUNTER — Other Ambulatory Visit: Payer: Self-pay | Admitting: Thoracic Surgery (Cardiothoracic Vascular Surgery)

## 2011-07-04 DIAGNOSIS — C3410 Malignant neoplasm of upper lobe, unspecified bronchus or lung: Secondary | ICD-10-CM

## 2011-07-05 ENCOUNTER — Encounter: Payer: Self-pay | Admitting: Thoracic Surgery (Cardiothoracic Vascular Surgery)

## 2011-07-09 ENCOUNTER — Ambulatory Visit (INDEPENDENT_AMBULATORY_CARE_PROVIDER_SITE_OTHER): Payer: Self-pay | Admitting: Surgical

## 2011-07-09 ENCOUNTER — Ambulatory Visit
Admission: RE | Admit: 2011-07-09 | Discharge: 2011-07-09 | Disposition: A | Discharge status: Home / Self Care | Payer: Medicare Other | Source: Ambulatory Visit | Attending: Thoracic Surgery (Cardiothoracic Vascular Surgery) | Admitting: Thoracic Surgery (Cardiothoracic Vascular Surgery)

## 2011-07-09 VITALS — BP 120/81 | HR 100 | Temp 97.0°F | Resp 18 | Ht 66.0 in | Wt 143.0 lb

## 2011-07-09 DIAGNOSIS — C3410 Malignant neoplasm of upper lobe, unspecified bronchus or lung: Secondary | ICD-10-CM

## 2011-07-09 DIAGNOSIS — D381 Neoplasm of uncertain behavior of trachea, bronchus and lung: Secondary | ICD-10-CM

## 2011-07-09 NOTE — Patient Instructions (Signed)
Clean chest tube site gently with cotton swab and hydrogen peroxide twice daily and shower normally with soap and water.

## 2011-07-09 NOTE — Progress Notes (Signed)
  HPI: Patient returns for routine postoperative follow-up having undergoneright video-assisted thoracoscopic surgery on 06/14/2011. The patient's early postoperative recovery while in the hospital was notable for being quite unremarkable. Since hospital discharge the patient reports that she has done quite well. She has had no significant pain and required no pain medication. She denies shortness of breath. She is resume driving. She is basically doing her routine activities at this time. She has had a superficial dehiscence of one of her chest tube sites. This has had some drainage. She has had no fevers chills or other constitutional symptoms. The drainage has discontinued. There is no erythema associated with the wound. He has been cleaning it daily.   Current Outpatient Prescriptions  Medication Sig Dispense Refill  . amitriptyline (ELAVIL) 25 MG tablet Take 25 mg by mouth at bedtime.       Marland Kitchen amLODipine (NORVASC) 10 MG tablet Take 10 mg by mouth daily.        Marland Kitchen aspirin 81 MG tablet Take 81 mg by mouth daily.        . B Complex Vitamins (VITAMIN-B COMPLEX PO) Take by mouth daily.        Marland Kitchen buPROPion (WELLBUTRIN SR) 150 MG 12 hr tablet Take one tablet by mouth every AM for 3 days then one tablet by mouth twice daily for 3 months  60 tablet  3  . Calcium Carbonate-Vitamin D (CALCIUM + D PO) Take 600 mg by mouth daily.       . Cholecalciferol (VITAMIN D-3 PO) Take 2,000 Units by mouth daily.       . diclofenac (VOLTAREN) 75 MG EC tablet Take 75 mg by mouth daily.       Marland Kitchen levothyroxine (SYNTHROID, LEVOTHROID) 125 MCG tablet Take 125 mcg by mouth daily.        Marland Kitchen LORazepam (ATIVAN) 2 MG tablet Take 2 mg by mouth every 8 (eight) hours as needed.        . metoprolol tartrate (LOPRESSOR) 25 MG tablet Take 25 mg by mouth 2 (two) times daily.        Marland Kitchen omeprazole (PRILOSEC) 40 MG capsule Take 40 mg by mouth daily.       . simvastatin (ZOCOR) 40 MG tablet Take 1 tablet (40 mg total) by mouth every evening.   30 tablet  2    Physical Exam: General appearance: Well-developed adult female in no acute distress Pulmonary examination: Clear lungs bilaterally. Cardiac examination: Regular rate and rhythm normal S1-S2. Incision: Healing well without evidence of infection. A chest tube site does have a superficial dehiscence. There is no erythema. There is a small amount of fibrinous exudate. There is some granulation tissue. Extremities: No edema   Diagnostic Tests: A chest x-ray was obtained on today's date. It reveals improvement in the right apical space versus pneumothorax. Is approximately 5% and improved from previous exam. There are no new infiltrates.   The pathology has revealed necrotizing granulomatous inflammation with cystic areas of necrosis. There is no evidence of malignancy. This is the same diagnosis previously from her 2006 specimen. Impression: The patient is quite stable I instructed her on cleansing the chest tube site with hydrogen peroxide twice daily and showering normally with soap and water.   Plan: We will see the patient on a when necessary basis at this point for any new cardiothoracic surgical issues.

## 2011-07-10 ENCOUNTER — Ambulatory Visit (INDEPENDENT_AMBULATORY_CARE_PROVIDER_SITE_OTHER): Payer: Medicare Other | Admitting: Cardiology

## 2011-07-10 ENCOUNTER — Encounter: Payer: Self-pay | Admitting: Cardiology

## 2011-07-10 ENCOUNTER — Other Ambulatory Visit: Payer: Self-pay | Admitting: Cardiology

## 2011-07-10 ENCOUNTER — Other Ambulatory Visit (INDEPENDENT_AMBULATORY_CARE_PROVIDER_SITE_OTHER): Payer: Medicare Other | Admitting: *Deleted

## 2011-07-10 DIAGNOSIS — F17200 Nicotine dependence, unspecified, uncomplicated: Secondary | ICD-10-CM

## 2011-07-10 DIAGNOSIS — I10 Essential (primary) hypertension: Secondary | ICD-10-CM

## 2011-07-10 DIAGNOSIS — J449 Chronic obstructive pulmonary disease, unspecified: Secondary | ICD-10-CM

## 2011-07-10 DIAGNOSIS — I739 Peripheral vascular disease, unspecified: Secondary | ICD-10-CM

## 2011-07-10 DIAGNOSIS — I2510 Atherosclerotic heart disease of native coronary artery without angina pectoris: Secondary | ICD-10-CM

## 2011-07-10 DIAGNOSIS — E785 Hyperlipidemia, unspecified: Secondary | ICD-10-CM

## 2011-07-10 DIAGNOSIS — J4489 Other specified chronic obstructive pulmonary disease: Secondary | ICD-10-CM

## 2011-07-10 DIAGNOSIS — E7800 Pure hypercholesterolemia, unspecified: Secondary | ICD-10-CM

## 2011-07-10 LAB — HEPATIC FUNCTION PANEL
ALT: 29 U/L (ref 0–35)
AST: 24 U/L (ref 0–37)
Albumin: 4 g/dL (ref 3.5–5.2)
Alkaline Phosphatase: 83 U/L (ref 39–117)
Bilirubin, Direct: 0.2 mg/dL (ref 0.0–0.3)
Total Bilirubin: 0.5 mg/dL (ref 0.3–1.2)
Total Protein: 7.2 g/dL (ref 6.0–8.3)

## 2011-07-10 LAB — LDL CHOLESTEROL, DIRECT: Direct LDL: 120.5 mg/dL

## 2011-07-10 LAB — LIPID PANEL
Cholesterol: 214 mg/dL — ABNORMAL HIGH (ref 0–200)
HDL: 73.2 mg/dL (ref >39.00)
Total CHOL/HDL Ratio: 3
Triglycerides: 137 mg/dL (ref 0.0–149.0)
VLDL: 27.4 mg/dL (ref 0.0–40.0)

## 2011-07-10 MED ORDER — ATORVASTATIN CALCIUM 40 MG PO TABS: 40.0000 mg | ORAL_TABLET | Freq: Every day | ORAL | Status: DC

## 2011-07-10 NOTE — Progress Notes (Signed)
PCP: Dr. Scotty Court  65 yo with history of smoking, HTN, COPD, hyperlipidemia, and mesenteric vascular disease returns for cardiology evaluation.  Patient had been having nocturnal and post-prandial abdominal cramping.  She had a mesenteric angiogram done by Dr. Imogene Burn, showing occluded celiac and SMA arteries with patent IMA.  She was planned for aorto-mesenteric bypass. She was referred for cardiology evaluation prior to surgery.   Given her exertional chest tightness and shortness of breath as well as known vascular disease, I set her up for left heart cath.  This was done in 5/12 and showed a 70-80% ostial stenosis of a small to moderate 2nd diagonal.  This was too small to intervene on and not likely to be particularly symptomatic.  Lower extremity arterial dopplers showed a severe focal proximal right SFA stenosis with collaterals from the PFA. She had a VATS for a right-sided lung nodule that showed granulomatous inflammation (no cancer).  She has quit smoking.   Lately, patient has had no further abdominal pain.  She says that Drs Scotty Court and Buccini have talked and have decided to have her hold off on mesenteric bypass until typical symptoms recur.  She has been breathing better since quitting smoking but still is short of breath with steps.  No further exertional chest pain (on metoprolol and amlodipine).  She has an Advair inhaler but is not using it regularly.  Right calf claudication has improved with a walking program.  She is able to go about 1/4 mile before her right calf hurts.  Labs (1/12): LDL 166, HDL 87, TSH decreased, AST 97, ALT 129, K 4.9, creatinine 0.6 Labs (5/12): K 4.8, creatinine 0.9, AST 25, ALT 37. Labs (7/12): HDL 98.6, LDL 112 Labs (9/12): K 3.7, creatinine 0.56, LFTs normal  ECG: NSR, Qs in V1 and V2  PMH: 1. Hypothyroidism 2. HTN 3. GERD 4. Hypothyroidism 5. OSA 6. Venous insufficiency 7. Mesenteric ischemia: Patient has "intestinal angina."  Mesenteric angiogram  (5/12) showed occluded celiac and SMA arteries.  The IMA was patent.  8. Hyperlipidemia 9. COPD: PFTs (6/12) with moderate obstructive airways disease and response to bronchodilator.  10. Hyperlipidemia 11. Fibromyalgia 12. Breast cancer s/p mastectomy and chemotherapy in 2010.  13. CAD: LHC (5/12) with 70-80% ostial stenosis of a small-moderate 2nd diagonal.  This was too small to intervene upon and unlikely to cause significant symptoms.  14. PAD: Peripheral arterial dopplers (5/12) with severe focal proximal right SFA stenosis.  There were collaterals from the PFA.  Patient has claudication symptoms.  15. 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 VATS 8/12 for right upper lobe nodule showed granulomatous material.   SH: Married, lives in [place], quit smoking 7/12.   FH: Father with MI at 53, brother with MI at 60  ROS: All systems reviewed and negative except as per HPI.   Current Outpatient Prescriptions  Medication Sig Dispense Refill  . amitriptyline (ELAVIL) 25 MG tablet Take 25 mg by mouth at bedtime.       Marland Kitchen amLODipine (NORVASC) 10 MG tablet Take 10 mg by mouth daily.        Marland Kitchen aspirin 81 MG tablet Take 81 mg by mouth daily.        . B Complex Vitamins (VITAMIN-B COMPLEX PO) Take by mouth daily.        Marland Kitchen buPROPion (WELLBUTRIN SR) 150 MG 12 hr tablet Take one tablet by mouth every AM for 3 days then one tablet by  mouth twice daily for 3 months  60 tablet  3  . Calcium Carbonate-Vitamin D (CALCIUM + D PO) Take 600 mg by mouth daily.       . Cholecalciferol (VITAMIN D-3 PO) Take 2,000 Units by mouth daily.       . diclofenac (VOLTAREN) 75 MG EC tablet Take 75 mg by mouth daily.       Marland Kitchen levothyroxine (SYNTHROID, LEVOTHROID) 125 MCG tablet Take 125 mcg by mouth daily.        Marland Kitchen LORazepam (ATIVAN) 2 MG tablet Take 2 mg by mouth every 8 (eight) hours as needed.        . metoprolol tartrate (LOPRESSOR) 25 MG tablet Take 25 mg by mouth 2  (two) times daily.        Marland Kitchen omeprazole (PRILOSEC) 40 MG capsule Take 40 mg by mouth daily.         BP 137/87  Pulse 82  Resp 18  Ht 5\' 6"  (1.676 m)  Wt 143 lb 12.8 oz (65.227 kg)  BMI 23.21 kg/m2 General: NAD, thin Neck: No JVD, no thyromegaly or thyroid nodule.  Lungs: Distant breath sounds bilaterally.  CV: Nondisplaced PMI.  Heart regular S1/S2, no S3/S4, no murmur.  No peripheral edema.  No carotid bruit.  Difficult to palpate pedal pulses right foot.  Left foot with no PT pulse palpable but 2+ DP.  Abdomen: Soft, nontender, no hepatosplenomegaly, no distention.  Neurologic: Alert and oriented x 3.  Psych: Normal affect. Extremities: No clubbing or cyanosis.

## 2011-07-10 NOTE — Patient Instructions (Signed)
Stop simvastatin(zocor) .  Start atorvastatin(lipitor) 40mg  daily.   Use advair inhaler regularly  twice a day.   Your physician recommends that you have a FASTING lipid profile/liver profile today.  Your physician recommends that you return for a FASTING lipid profile/liver profile in 3 months--414.01  272.0  Your physician wants you to follow-up in: 4 months with Dr Shirlee Latch.(January 2013). You will receive a reminder letter in the mail two months in advance. If you don't receive a letter, please call our office to schedule the follow-up appointment.

## 2011-07-11 DIAGNOSIS — I2510 Atherosclerotic heart disease of native coronary artery without angina pectoris: Secondary | ICD-10-CM | POA: Insufficient documentation

## 2011-07-11 NOTE — Assessment & Plan Note (Signed)
Improved claudication with walking program.  Continue daily walks.  I encouraged her to try to walk through the pain for at least a short distance.

## 2011-07-11 NOTE — Assessment & Plan Note (Signed)
Left heart cath showed 70-80% moderate stenosis in a small 2nd diagonal.  Otherwise, no significant disease.  I do not think that this lesion is likely to cause significant symptoms and it is too small to intervene upon.  Regardless, she is not having any further chest pain now on metoprolol and amlodipine.  Continue ASA and statin.

## 2011-07-11 NOTE — Assessment & Plan Note (Signed)
No further intestinal angina symptoms.  Holding off on surgery unless symptoms return.

## 2011-07-11 NOTE — Assessment & Plan Note (Signed)
Goal LDL < 70.  She is on Zocor 40 and amlodipine 10, which gives risk of side effects.  I am going to have her stop Zocor and start atorvastatin 40 mg daily with lipids/LFTs in 2 months.

## 2011-07-11 NOTE — Assessment & Plan Note (Signed)
Patient has quit smoking for about 2 months now.

## 2011-07-11 NOTE — Assessment & Plan Note (Signed)
Patient's exertional dyspnea is likely due to moderate COPD.  There was response to bronchodilators on PFTs.  I asked her to use Advair regularly, rather than just PRN.

## 2011-07-11 NOTE — Assessment & Plan Note (Signed)
BP appears controlled.  

## 2011-07-13 ENCOUNTER — Telehealth: Payer: Self-pay | Admitting: Cardiology

## 2011-07-13 DIAGNOSIS — E7800 Pure hypercholesterolemia, unspecified: Secondary | ICD-10-CM

## 2011-07-13 NOTE — Telephone Encounter (Signed)
I left message that cholesterol was high on lab results done 07/11/11 and that she should start atorvastatin as Dr Shirlee Latch recommended.   She is scheduled for fasting lipid 10/01/11.

## 2011-07-13 NOTE — Telephone Encounter (Signed)
Pt returning your call

## 2011-07-13 NOTE — Telephone Encounter (Signed)
Pt wants refill of med for cholsterol atorvastatin to [facility] [address]

## 2011-07-13 NOTE — Telephone Encounter (Signed)
Pt rtn your call pls leave info on machine if she doesn't answer she has to go out to drug store

## 2011-07-16 MED ORDER — ATORVASTATIN CALCIUM 40 MG PO TABS: 40.0000 mg | ORAL_TABLET | Freq: Every day | ORAL | Status: DC

## 2011-08-07 ENCOUNTER — Other Ambulatory Visit: Payer: Self-pay | Admitting: Oncology

## 2011-08-07 ENCOUNTER — Encounter (HOSPITAL_BASED_OUTPATIENT_CLINIC_OR_DEPARTMENT_OTHER): Payer: Medicare Other | Admitting: Oncology

## 2011-08-07 DIAGNOSIS — C50119 Malignant neoplasm of central portion of unspecified female breast: Secondary | ICD-10-CM

## 2011-08-07 DIAGNOSIS — Z170 Estrogen receptor positive status [ER+]: Secondary | ICD-10-CM

## 2011-08-07 DIAGNOSIS — R110 Nausea: Secondary | ICD-10-CM

## 2011-08-07 LAB — CBC WITH DIFFERENTIAL/PLATELET
BASO%: 0.6 % (ref 0.0–2.0)
Basophils Absolute: 0 10*3/uL (ref 0.0–0.1)
EOS%: 3.8 % (ref 0.0–7.0)
Eosinophils Absolute: 0.2 10*3/uL (ref 0.0–0.5)
HCT: 43 % (ref 34.8–46.6)
HGB: 14.5 g/dL (ref 11.6–15.9)
LYMPH%: 21.9 % (ref 14.0–49.7)
MCH: 31 pg (ref 25.1–34.0)
MCHC: 33.7 g/dL (ref 31.5–36.0)
MCV: 92 fL (ref 79.5–101.0)
MONO#: 0.5 10*3/uL (ref 0.1–0.9)
MONO%: 8.4 % (ref 0.0–14.0)
NEUT#: 3.8 10*3/uL (ref 1.5–6.5)
NEUT%: 65.3 % (ref 38.4–76.8)
Platelets: 378 10*3/uL (ref 145–400)
RBC: 4.68 10*6/uL (ref 3.70–5.45)
RDW: 14.5 % (ref 11.2–14.5)
WBC: 5.8 10*3/uL (ref 3.9–10.3)
lymph#: 1.3 10*3/uL (ref 0.9–3.3)

## 2011-08-08 LAB — LACTATE DEHYDROGENASE: LDH: 128 U/L (ref 94–250)

## 2011-08-08 LAB — COMPREHENSIVE METABOLIC PANEL WITH GFR
ALT: 20 U/L (ref 0–35)
AST: 20 U/L (ref 0–37)
Albumin: 4.6 g/dL (ref 3.5–5.2)
Alkaline Phosphatase: 84 U/L (ref 39–117)
BUN: 16 mg/dL (ref 6–23)
CO2: 26 meq/L (ref 19–32)
Calcium: 9.6 mg/dL (ref 8.4–10.5)
Chloride: 106 meq/L (ref 96–112)
Creatinine, Ser: 1.01 mg/dL (ref 0.50–1.10)
Glucose, Bld: 105 mg/dL — ABNORMAL HIGH (ref 70–99)
Potassium: 4.1 meq/L (ref 3.5–5.3)
Sodium: 142 meq/L (ref 135–145)
Total Bilirubin: 0.5 mg/dL (ref 0.3–1.2)
Total Protein: 7.4 g/dL (ref 6.0–8.3)

## 2011-08-08 LAB — VITAMIN D 25 HYDROXY (VIT D DEFICIENCY, FRACTURES): Vit D, 25-Hydroxy: 36 ng/mL (ref 30–89)

## 2011-08-08 LAB — CANCER ANTIGEN 27.29: CA 27.29: 29 U/mL (ref 0–39)

## 2011-08-14 ENCOUNTER — Telehealth: Payer: Self-pay | Admitting: *Deleted

## 2011-08-14 ENCOUNTER — Other Ambulatory Visit: Payer: Self-pay | Admitting: Oncology

## 2011-08-14 ENCOUNTER — Encounter (HOSPITAL_BASED_OUTPATIENT_CLINIC_OR_DEPARTMENT_OTHER): Payer: Medicare Other | Admitting: Oncology

## 2011-08-14 DIAGNOSIS — C50919 Malignant neoplasm of unspecified site of unspecified female breast: Secondary | ICD-10-CM

## 2011-08-22 ENCOUNTER — Ambulatory Visit (HOSPITAL_COMMUNITY)
Admission: RE | Admit: 2011-08-22 | Discharge: 2011-08-22 | Disposition: A | Discharge status: Home / Self Care | Payer: Medicare Other | Source: Ambulatory Visit | Attending: Oncology | Admitting: Oncology

## 2011-08-22 DIAGNOSIS — R51 Headache: Secondary | ICD-10-CM | POA: Insufficient documentation

## 2011-08-22 DIAGNOSIS — C50919 Malignant neoplasm of unspecified site of unspecified female breast: Secondary | ICD-10-CM | POA: Insufficient documentation

## 2011-08-22 DIAGNOSIS — F29 Unspecified psychosis not due to a substance or known physiological condition: Secondary | ICD-10-CM | POA: Insufficient documentation

## 2011-08-22 DIAGNOSIS — R110 Nausea: Secondary | ICD-10-CM | POA: Insufficient documentation

## 2011-08-22 DIAGNOSIS — H9190 Unspecified hearing loss, unspecified ear: Secondary | ICD-10-CM | POA: Insufficient documentation

## 2011-08-22 MED ORDER — GADOBENATE DIMEGLUMINE 529 MG/ML IV SOLN
15.0000 mL | Freq: Once | INTRAVENOUS | Status: AC | PRN
  Administered 2011-08-22: 14 mL via INTRAVENOUS

## 2011-08-28 ENCOUNTER — Other Ambulatory Visit: Payer: Self-pay

## 2011-08-28 DIAGNOSIS — R0609 Other forms of dyspnea: Secondary | ICD-10-CM

## 2011-08-28 DIAGNOSIS — R0989 Other specified symptoms and signs involving the circulatory and respiratory systems: Secondary | ICD-10-CM

## 2011-08-28 MED ORDER — BUPROPION HCL ER (SR) 150 MG PO TB12: ORAL_TABLET | ORAL | Status: DC

## 2011-08-28 NOTE — Telephone Encounter (Signed)
.   Requested Prescriptions   Signed Prescriptions Disp Refills  . buPROPion (WELLBUTRIN SR) 150 MG 12 hr tablet 60 tablet 3    Sig: Take one tablet by mouth twice daily.    Authorizing Provider: Marca Ancona    Ordering User: Lacie Scotts  E-scribe to [facility] pharmacy.

## 2011-09-04 ENCOUNTER — Telehealth: Payer: Self-pay | Admitting: *Deleted

## 2011-09-04 NOTE — Telephone Encounter (Signed)
Per MD, notified pt that brain scan is "normal" pt will follow up with primary MD

## 2011-09-13 ENCOUNTER — Encounter: Payer: Self-pay | Admitting: Internal Medicine

## 2011-09-13 ENCOUNTER — Ambulatory Visit (INDEPENDENT_AMBULATORY_CARE_PROVIDER_SITE_OTHER): Payer: Medicare Other | Admitting: Internal Medicine

## 2011-09-13 ENCOUNTER — Other Ambulatory Visit: Payer: Self-pay | Admitting: *Deleted

## 2011-09-13 VITALS — BP 144/80 | HR 79 | Temp 97.9°F | Wt 148.0 lb

## 2011-09-13 DIAGNOSIS — E785 Hyperlipidemia, unspecified: Secondary | ICD-10-CM

## 2011-09-13 DIAGNOSIS — IMO0001 Reserved for inherently not codable concepts without codable children: Secondary | ICD-10-CM

## 2011-09-13 DIAGNOSIS — J984 Other disorders of lung: Secondary | ICD-10-CM

## 2011-09-13 DIAGNOSIS — I10 Essential (primary) hypertension: Secondary | ICD-10-CM

## 2011-09-13 DIAGNOSIS — I739 Peripheral vascular disease, unspecified: Secondary | ICD-10-CM

## 2011-09-13 DIAGNOSIS — F17200 Nicotine dependence, unspecified, uncomplicated: Secondary | ICD-10-CM

## 2011-09-13 DIAGNOSIS — K559 Vascular disorder of intestine, unspecified: Secondary | ICD-10-CM

## 2011-09-13 DIAGNOSIS — E039 Hypothyroidism, unspecified: Secondary | ICD-10-CM

## 2011-09-13 DIAGNOSIS — I2510 Atherosclerotic heart disease of native coronary artery without angina pectoris: Secondary | ICD-10-CM

## 2011-09-13 DIAGNOSIS — R911 Solitary pulmonary nodule: Secondary | ICD-10-CM

## 2011-09-13 DIAGNOSIS — C50919 Malignant neoplasm of unspecified site of unspecified female breast: Secondary | ICD-10-CM

## 2011-09-13 MED ORDER — LORAZEPAM 2 MG PO TABS: 2.0000 mg | ORAL_TABLET | Freq: Three times a day (TID) | ORAL | Status: DC | PRN

## 2011-09-13 NOTE — Progress Notes (Signed)
Subjective:    Patient ID: [name], female    DOB: 07-07-1946, 65 y.o.   MRN: [id number]  HPI [name] is seen as a transfer from Dr. Scotty Court, who has retired. She is feeling a little fatigued and bloated but otherwise has a lot of energy and feels pretty good. She has a complex medical history that is reviewed in great detail with up-dates to her history as needed.  Past Medical History  Diagnosis Date  . COPD (chronic obstructive pulmonary disease)   . GERD (gastroesophageal reflux disease)   . Hypertension   . Diverticulitis     required colcectomy  . Osteoporosis   . Hyperlipidemia   . Emphysema of lung   . Cancer 2010    invasive ductal breast cancer; on tamoxifen  . Fibromyalgia 1995  . Hx of colonic polyps     Dr. Matthias Hughs -last study '11  . Peripheral vascular disease May '12 - CT angio    SMA chronic occlusion; stenosis of celiac trunk.  . Hypothyroidism   . Hearing loss of both ears     uses hearing aids  bilaterally  . Varicose veins of leg with swelling     varicose vein surgery - Dr. Guss Bunde   Past Surgical History  Procedure Date  . Appendectomy   . Breast surgery     mastectomy with reconstructive surgery  . Wedge resection     right upper lobe of lung as part of VAT-nodule/benign  . Colonoscopy w/ polypectomy   . Colectomy 2002    sigmoid  . Abdominal hysterectomy 1973    metorrhagia  . Cardiac catheterization '11    no obstructive coronary disease   Family History  Problem Relation Age of Onset  . Cancer Mother     colon  . COPD Mother     brown lung, emphysema  . Hypertension Mother   . Diabetes Mother   . Heart disease Father     CAD/MI - sudden death  . Cancer Sister     fallopian tube; throat  . Hypertension Sister   . Heart disease Brother     CAD/MI early 51's  . Cancer Sister     throat cancer  . Cancer Sister     melanoma  . Hypertension Sister   . Cancer Sister     pancreatic  . Cancer Sister    History    Social History  . Marital Status: Married    Spouse Name: N/A    Number of Children: 3  . Years of Education: 12   Occupational History  . beautician     retired   Social History Main Topics  . Smoking status: Current Everyday Smoker -- 0.7 packs/day    Types: Cigarettes  . Smokeless tobacco: Never Used  . Alcohol Use: 6.0 oz/week    10 Cans of beer per week  . Drug Use: No  . Sexually Active: Not Currently   Other Topics Concern  . Not on file   Social History Narrative   HSG, beauty school. Married '69 -7 yrs/widowed; married '79 - 3yr/divorced; married '87.  2 sons - '70, '74, 1 srtep-son; 1 grandchild. 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 SO - good health.  NO history of abuse.  ACP - full code and all heroic measures.        Review of Systems System review is negative for any constitutional, cardiac, pulmonary, GI or neuro symptoms or complaints other than as described in the  HPI.    Objective:   Physical Exam Vitals - mild elevation in BP otherwise normal Gen'l- WNWD white woman in no distress HEENT- EACs/TMs normal Neck - supple, no thyromegaly Pulm - good BS, no rales or wheezes, no increased work of breathing Cor- 2+ radial pulse, no palpable DP/PT pulse right, 2+ DP,PT pulse left; RRR,  Abd- BS+ x 4, no guarding or rebound, infraabdominal surgical scar Genitalia/rectal - deferred Ext - no deformity Neuro - non-focal and normal CN.  Lab Results  Component Value Date   WBC 5.8 08/07/2011   HGB 14.5 08/07/2011   HCT 43.0 08/07/2011   PLT 378 08/07/2011   GLUCOSE 105* 08/07/2011                  CHOL 214* 07/10/2011   TRIG 137.0 07/10/2011   HDL 73.20 07/10/2011   LDLDIRECT 120.5 07/10/2011   ALT 20 08/07/2011                  AST 20 08/07/2011               08/07/2011   NA 142 08/07/2011               08/07/2011   K 4.1 08/07/2011                  CL 106 08/07/2011                  CREATININE 1.01 08/07/2011                  BUN  16 08/07/2011               08/07/2011   CO2 26 08/07/2011                  TSH 0.10* 11/09/2010   INR 0.94 06/12/2011           Assessment & Plan:

## 2011-09-16 NOTE — Assessment & Plan Note (Signed)
BP Readings from Last 3 Encounters:  09/13/11 144/80  07/10/11 137/87  07/09/11 120/81   Fair control. Will continue present meds for now.

## 2011-09-16 NOTE — Assessment & Plan Note (Signed)
Absolutely a risk factor for vascular disease, see other problems, that must be addressed. This is one of the most dangerous problems she has at this time.  Plan - more extensive cessation counseling

## 2011-09-16 NOTE — Assessment & Plan Note (Signed)
Lab Results  Component Value Date   TSH 0.10* 11/09/2010   May be slightly over-corrected. Will repeat lab with recommendations to follow

## 2011-09-16 NOTE — Assessment & Plan Note (Signed)
Lab Results  Component Value Date   CHOL 214* 07/10/2011   HDL 73.20 07/10/2011   LDLDIRECT 120.5 07/10/2011   TRIG 137.0 07/10/2011   CHOLHDL 3 07/10/2011   For low to moderated risk patient the LDL is better than goal of 130 or less but not at goal for patient on medication.  Plan - will discuss either lipitor 80 or crestor 20 at next office visit

## 2011-09-16 NOTE — Assessment & Plan Note (Signed)
No symptoms of claudication at this time.  Plan - risk factor modification

## 2011-09-16 NOTE — Assessment & Plan Note (Signed)
Patient with severe peripheral vascular disease to the gut. She is currently asymptomatic but is at risk for mesenteric angina or ischemia.  Plan - MUST STOP SMOKING           Follow-up with Dr. Imogene Burn as instructed.

## 2011-09-16 NOTE — Assessment & Plan Note (Signed)
Good recovery from VATS procedure - benign lesion.  Plan - SMOKING CESSATION.

## 2011-09-16 NOTE — Assessment & Plan Note (Signed)
Follows with oncology. Recently had MRI brain - no mets. She is tolerating tamoxifen.

## 2011-09-16 NOTE — Assessment & Plan Note (Signed)
No chest pain and breathing is better. Has followed up with Dr. Shirlee Latch.  Plan - risk factor modification: lipids,BP           SMOKING CESSATION

## 2011-09-16 NOTE — Assessment & Plan Note (Signed)
Long standing problem currently stable on Wellbutrin, elavil and diclofenac.

## 2011-10-01 ENCOUNTER — Other Ambulatory Visit (INDEPENDENT_AMBULATORY_CARE_PROVIDER_SITE_OTHER): Payer: Medicare Other | Admitting: *Deleted

## 2011-10-01 DIAGNOSIS — E039 Hypothyroidism, unspecified: Secondary | ICD-10-CM

## 2011-10-01 DIAGNOSIS — E7800 Pure hypercholesterolemia, unspecified: Secondary | ICD-10-CM

## 2011-10-01 LAB — LIPID PANEL
Cholesterol: 163 mg/dL (ref 0–200)
HDL: 76 mg/dL
LDL Cholesterol: 78 mg/dL (ref 0–99)
Total CHOL/HDL Ratio: 2
Triglycerides: 45 mg/dL (ref 0.0–149.0)
VLDL: 9 mg/dL (ref 0.0–40.0)

## 2011-10-01 LAB — HEPATIC FUNCTION PANEL
ALT: 28 U/L (ref 0–35)
AST: 24 U/L (ref 0–37)
Albumin: 4.1 g/dL (ref 3.5–5.2)
Alkaline Phosphatase: 82 U/L (ref 39–117)
Bilirubin, Direct: 0.1 mg/dL (ref 0.0–0.3)
Total Bilirubin: 0.5 mg/dL (ref 0.3–1.2)
Total Protein: 7.1 g/dL (ref 6.0–8.3)

## 2011-10-01 LAB — T4, FREE: Free T4: 1.07 ng/dL (ref 0.60–1.60)

## 2011-10-01 LAB — TSH: TSH: 0.68 u[IU]/mL (ref 0.35–5.50)

## 2011-10-03 ENCOUNTER — Telehealth: Payer: Self-pay | Admitting: Cardiology

## 2011-10-03 NOTE — Telephone Encounter (Signed)
Fu call  °Pt returning your call about test results °

## 2011-10-04 NOTE — Telephone Encounter (Signed)
Pt rtn call to get results °

## 2011-10-04 NOTE — Telephone Encounter (Signed)
Discussed with pt

## 2011-10-10 ENCOUNTER — Encounter: Payer: Self-pay | Admitting: Internal Medicine

## 2011-10-24 ENCOUNTER — Encounter: Payer: Self-pay | Admitting: Internal Medicine

## 2011-10-30 ENCOUNTER — Other Ambulatory Visit: Payer: Self-pay | Admitting: Medical Oncology

## 2011-10-30 DIAGNOSIS — C50919 Malignant neoplasm of unspecified site of unspecified female breast: Secondary | ICD-10-CM

## 2011-10-30 MED ORDER — ANASTROZOLE 1 MG PO TABS: 1.0000 mg | ORAL_TABLET | Freq: Every day | ORAL | Status: AC

## 2011-11-07 ENCOUNTER — Other Ambulatory Visit: Payer: Self-pay | Admitting: *Deleted

## 2011-11-07 DIAGNOSIS — R112 Nausea with vomiting, unspecified: Secondary | ICD-10-CM

## 2011-11-07 DIAGNOSIS — C50119 Malignant neoplasm of central portion of unspecified female breast: Secondary | ICD-10-CM

## 2011-11-07 DIAGNOSIS — Z170 Estrogen receptor positive status [ER+]: Secondary | ICD-10-CM

## 2011-11-07 MED ORDER — METOCLOPRAMIDE HCL 10 MG PO TABS: 10.0000 mg | ORAL_TABLET | Freq: Three times a day (TID) | ORAL | Status: DC

## 2011-12-07 ENCOUNTER — Other Ambulatory Visit: Payer: Self-pay | Admitting: Family Medicine

## 2011-12-31 ENCOUNTER — Other Ambulatory Visit: Payer: Self-pay | Admitting: Cardiology

## 2012-01-18 ENCOUNTER — Telehealth: Payer: Self-pay

## 2012-01-18 NOTE — Telephone Encounter (Signed)
Rx refill request received for Ativan 2 mg, last filled 01/13/2012

## 2012-01-20 NOTE — Telephone Encounter (Signed)
Chronic medication provided by Dr. Scotty Court. Usage averages to 8.3 tabs per week.  Plan - ok to refill with 5 add'l.

## 2012-01-21 MED ORDER — LORAZEPAM 2 MG PO TABS: 2.0000 mg | ORAL_TABLET | Freq: Three times a day (TID) | ORAL | Status: DC | PRN

## 2012-01-21 NOTE — Telephone Encounter (Signed)
Done

## 2012-01-28 ENCOUNTER — Telehealth: Payer: Self-pay | Admitting: Cardiology

## 2012-01-28 ENCOUNTER — Encounter: Payer: Self-pay | Admitting: Cardiology

## 2012-01-28 ENCOUNTER — Ambulatory Visit (INDEPENDENT_AMBULATORY_CARE_PROVIDER_SITE_OTHER)
Admission: RE | Admit: 2012-01-28 | Discharge: 2012-01-28 | Disposition: A | Discharge status: Home / Self Care | Payer: Medicare Other | Source: Ambulatory Visit | Attending: Cardiology | Admitting: Cardiology

## 2012-01-28 ENCOUNTER — Ambulatory Visit (INDEPENDENT_AMBULATORY_CARE_PROVIDER_SITE_OTHER): Payer: Medicare Other | Admitting: Cardiology

## 2012-01-28 VITALS — BP 150/90 | HR 89 | Ht 66.0 in | Wt 146.1 lb

## 2012-01-28 DIAGNOSIS — J4489 Other specified chronic obstructive pulmonary disease: Secondary | ICD-10-CM

## 2012-01-28 DIAGNOSIS — I10 Essential (primary) hypertension: Secondary | ICD-10-CM

## 2012-01-28 DIAGNOSIS — R0609 Other forms of dyspnea: Secondary | ICD-10-CM

## 2012-01-28 DIAGNOSIS — I739 Peripheral vascular disease, unspecified: Secondary | ICD-10-CM

## 2012-01-28 DIAGNOSIS — K559 Vascular disorder of intestine, unspecified: Secondary | ICD-10-CM

## 2012-01-28 DIAGNOSIS — E785 Hyperlipidemia, unspecified: Secondary | ICD-10-CM

## 2012-01-28 DIAGNOSIS — R0989 Other specified symptoms and signs involving the circulatory and respiratory systems: Secondary | ICD-10-CM

## 2012-01-28 DIAGNOSIS — J449 Chronic obstructive pulmonary disease, unspecified: Secondary | ICD-10-CM

## 2012-01-28 DIAGNOSIS — I2510 Atherosclerotic heart disease of native coronary artery without angina pectoris: Secondary | ICD-10-CM

## 2012-01-28 DIAGNOSIS — I779 Disorder of arteries and arterioles, unspecified: Secondary | ICD-10-CM

## 2012-01-28 MED ORDER — PREDNISONE 20 MG PO TABS: ORAL_TABLET | ORAL | Status: DC

## 2012-01-28 MED ORDER — IPRATROPIUM-ALBUTEROL 18-103 MCG/ACT IN AERO: 2.0000 | INHALATION_SPRAY | Freq: Four times a day (QID) | RESPIRATORY_TRACT | Status: DC | PRN

## 2012-01-28 MED ORDER — DOXYCYCLINE HYCLATE 100 MG PO TABS: 100.0000 mg | ORAL_TABLET | Freq: Two times a day (BID) | ORAL | Status: DC

## 2012-01-28 NOTE — Telephone Encounter (Signed)
New Problem:     Patient returned your phone call.  Please call back. 

## 2012-01-28 NOTE — Assessment & Plan Note (Signed)
Rhonchorous lungs with productive cough/congestion.  Wheezing.  I think that she has acute bronchitis triggering COPD exacerbation.   - Combivent inhaler to be used qid.   - Doxycycline 100 mg bid - prednisone 40 mg daily x 5 days - CXR to rule out PNA.  - She needs to stop smoking.  I strongly encouraged her to work again on quitting.

## 2012-01-28 NOTE — Telephone Encounter (Signed)
Spoke with pt about recent chest xray results

## 2012-01-28 NOTE — Patient Instructions (Signed)
Take doxycycline 100mg  twice a day for 7 days.  Take prednisone 40mg  daily for 5 days.  Use combivent inhaler four times a day as needed for wheezing/ shortness of breath.  Dr Shirlee Latch recommends that you follow-up with Dr Debby Bud if you do not feel better after taking this medication.  A chest x-ray takes a picture of the organs and structures inside the chest, including the heart, lungs, and blood vessels. This test can show several things, including, whether the heart is enlarges; whether fluid is building up in the lungs; and whether pacemaker / defibrillator leads are still in place.  TODAY  Your physician has requested that you have a lower  extremity arterial duplex. This test is an ultrasound of the arteries in the legs . It looks at arterial blood flow in the legs . Allow one hour for Lower  Arterial scans. There are no restrictions or special instructions May 2013   Your physician recommends that you return for a FASTING lipid profile: June 2013.   Your physician recommends that you schedule a follow-up appointment in: 4 months with Dr Shirlee Latch.

## 2012-01-28 NOTE — Assessment & Plan Note (Signed)
Still with right calf claudication.  I encouraged her to try to walk through the pain for at least a short distance.  Repeat lower extremity dopplers in 5/13 .

## 2012-01-28 NOTE — Assessment & Plan Note (Signed)
No further intestinal angina symptoms.  Holding off on surgery unless symptoms return.  

## 2012-01-28 NOTE — Progress Notes (Signed)
PCP: Dr. Scotty Court  66 yo with history of smoking, HTN, COPD, hyperlipidemia, and mesenteric vascular disease returns for cardiology evaluation.  Patient had been having nocturnal and post-prandial abdominal cramping.  She had a mesenteric angiogram done by Dr. Imogene Burn, showing occluded celiac and SMA arteries with patent IMA.  She was planned for aorto-mesenteric bypass. She was referred for cardiology evaluation prior to surgery.   Given her exertional chest tightness and shortness of breath as well as known vascular disease, I set her up for left heart cath.  This was done in 5/12 and showed a 70-80% ostial stenosis of a small to moderate 2nd diagonal.  This was too small to intervene on and not likely to be particularly symptomatic.  Lower extremity arterial dopplers showed a severe focal proximal right SFA stenosis with collaterals from the PFA. She had a VATS for a right-sided lung nodule that showed granulomatous inflammation (no cancer).  She quit smoking but then started back not long ago.   Lately, patient has had no further significant abdominal pain.  Dr.Buccini decided to have her hold off on mesenteric bypass until typical symptoms recur.  She continues to have right calf pain after walking about 100-200 yards.  This is unchanged.  She notes it especially when she walks on a treadmill for exercise.  No chest pain.    For the last few days, she has noted productive cough and congestion.  She is short of breath walking short distances, which is new for her.  She has been wheezing some and has chest pain with deep breathing.  No fever.    Labs (1/12): LDL 166, HDL 87, TSH decreased, AST 97, ALT 129, K 4.9, creatinine 0.6 Labs (5/12): K 4.8, creatinine 0.9, AST 25, ALT 37. Labs (7/12): HDL 98.6, LDL 112 Labs (9/12): K 3.7, creatinine 0.56, LFTs normal Labs (12/12): LDL 78, HDL 76, TGs 45  ECG: NSR, Qs in V1 and V2  PMH: 1. Hypothyroidism 2. HTN 3. GERD 4. Hypothyroidism 5. OSA 6. Venous  insufficiency 7. 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  The IMA was patent.  8. Hyperlipidemia 9. COPD: PFTs (6/12) with moderate obstructive airways disease and response to bronchodilator.  10. Hyperlipidemia 11. Fibromyalgia 12. Breast cancer s/p mastectomy and chemotherapy in 2010.  13. CAD: LHC (5/12) with 70-80% ostial stenosis of a small-moderate 2nd diagonal.  This was too small to intervene upon and unlikely to cause significant symptoms.  14. PAD: Peripheral arterial dopplers (5/12) with severe focal proximal right SFA stenosis.  There were collaterals from the PFA.  Patient has claudication symptoms.  15. 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 VATS 8/12 for right upper lobe nodule showed granulomatous material.   SH: Married, lives in [place], quit smoking 7/12.   FH: Father with MI at 17, brother with MI at 60  ROS: All systems reviewed and negative except as per HPI.   Current Outpatient Prescriptions  Medication Sig Dispense Refill  . amitriptyline (ELAVIL) 25 MG tablet TAKE TWO TABLET BY MOUTH EVERY AT BEDTIME  60 tablet  6  . amLODipine (NORVASC) 10 MG tablet Take 10 mg by mouth daily.        Marland Kitchen aspirin 81 MG tablet Take 81 mg by mouth daily.        . B Complex Vitamins (VITAMIN-B COMPLEX PO) Take by mouth daily.        Marland Kitchen  buPROPion (WELLBUTRIN SR) 150 MG 12 hr tablet TAKE ONE TABLET BY MOUTH TWICE DAILY  60 tablet  5  . Calcium Carbonate-Vitamin D (CALCIUM + D PO) Take 600 mg by mouth daily.       . diclofenac (VOLTAREN) 75 MG EC tablet TAKE ONE TABLET BY MOUTH TWICE DAILY AFTER MEALS FOR ARTHRITIS  60 tablet  8  . LORazepam (ATIVAN) 2 MG tablet Take 1 tablet (2 mg total) by mouth every 8 (eight) hours as needed.  30 tablet  5  . metoprolol tartrate (LOPRESSOR) 25 MG tablet Take 25 mg by mouth 2 (two) times daily.        . NON FORMULARY  advair inh -use regularly twice a day       . omeprazole (PRILOSEC) 40 MG capsule TAKE ONE CAPSULE BY MOUTH TWICE DAILY FOR GERD  60 capsule  7  . albuterol-ipratropium (COMBIVENT) 18-103 MCG/ACT inhaler Inhale 2 puffs into the lungs every 6 (six) hours as needed for wheezing.  1 Inhaler  0  . atorvastatin (LIPITOR) 40 MG tablet Take 1 tablet (40 mg total) by mouth daily.  30 tablet  3  . doxycycline (VIBRA-TABS) 100 MG tablet Take 1 tablet (100 mg total) by mouth 2 (two) times daily.  14 tablet  0  . predniSONE (DELTASONE) 20 MG tablet Take two tablets daily for 5 days  10 tablet  0    BP 150/90  Pulse 89  Ht 5\' 6"  (1.676 m)  Wt 146 lb 1.9 oz (66.28 kg)  BMI 23.58 kg/m2 General: NAD, thin Neck: No JVD, no thyromegaly or thyroid nodule.  Lungs: Distant breath sounds bilaterally. Diffuse rhonchi bilaterally, L>R.  CV: Nondisplaced PMI.  Heart regular S1/S2, no S3/S4, no murmur.  No peripheral edema.  No carotid bruit.  Difficult to palpate pedal pulses right foot.  Left foot with no PT pulse palpable but 2+ DP.  Abdomen: Soft, nontender, no hepatosplenomegaly, no distention.  Neurologic: Alert and oriented x 3.  Psych: Normal affect. Extremities: No clubbing or cyanosis.

## 2012-01-28 NOTE — Assessment & Plan Note (Signed)
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  Otherwise, no significant disease.  I do not think that this lesion is likely to cause significant symptoms and it is too small to intervene upon.  Regardless, she is not having any further chest pain now on metoprolol and amlodipine.  Continue ASA and statin.  

## 2012-01-28 NOTE — Assessment & Plan Note (Signed)
BP is elevated today, may be in setting of acute illness. No changes at this time.

## 2012-01-28 NOTE — Assessment & Plan Note (Signed)
Goal LDL < 70.  Repeat lipids in 6/13, continue atorvastatin 40 mg daily.

## 2012-02-05 ENCOUNTER — Other Ambulatory Visit: Payer: Self-pay | Admitting: Cardiology

## 2012-02-08 ENCOUNTER — Ambulatory Visit (HOSPITAL_BASED_OUTPATIENT_CLINIC_OR_DEPARTMENT_OTHER): Payer: Medicare Other | Admitting: Oncology

## 2012-02-08 ENCOUNTER — Other Ambulatory Visit (HOSPITAL_BASED_OUTPATIENT_CLINIC_OR_DEPARTMENT_OTHER): Payer: Medicare Other | Admitting: Lab

## 2012-02-08 ENCOUNTER — Telehealth: Payer: Self-pay | Admitting: Oncology

## 2012-02-08 VITALS — BP 183/79 | HR 74 | Temp 98.1°F | Ht 66.0 in | Wt 147.6 lb

## 2012-02-08 DIAGNOSIS — C50119 Malignant neoplasm of central portion of unspecified female breast: Secondary | ICD-10-CM

## 2012-02-08 DIAGNOSIS — E559 Vitamin D deficiency, unspecified: Secondary | ICD-10-CM

## 2012-02-08 DIAGNOSIS — Z170 Estrogen receptor positive status [ER+]: Secondary | ICD-10-CM

## 2012-02-08 DIAGNOSIS — C50919 Malignant neoplasm of unspecified site of unspecified female breast: Secondary | ICD-10-CM

## 2012-02-08 LAB — CBC WITH DIFFERENTIAL/PLATELET
BASO%: 1.3 % (ref 0.0–2.0)
Basophils Absolute: 0.1 10*3/uL (ref 0.0–0.1)
EOS%: 3.3 % (ref 0.0–7.0)
Eosinophils Absolute: 0.2 10*3/uL (ref 0.0–0.5)
HCT: 44.7 % (ref 34.8–46.6)
HGB: 14.8 g/dL (ref 11.6–15.9)
LYMPH%: 25.9 % (ref 14.0–49.7)
MCH: 30.2 pg (ref 25.1–34.0)
MCHC: 33 g/dL (ref 31.5–36.0)
MCV: 91.4 fL (ref 79.5–101.0)
MONO#: 0.7 10*3/uL (ref 0.1–0.9)
MONO%: 11.7 % (ref 0.0–14.0)
NEUT#: 3.7 10*3/uL (ref 1.5–6.5)
NEUT%: 57.8 % (ref 38.4–76.8)
Platelets: 379 10*3/uL (ref 145–400)
RBC: 4.89 10*6/uL (ref 3.70–5.45)
RDW: 15.4 % — ABNORMAL HIGH (ref 11.2–14.5)
WBC: 6.3 10*3/uL (ref 3.9–10.3)
lymph#: 1.6 10*3/uL (ref 0.9–3.3)
nRBC: 0 % (ref 0–0)

## 2012-02-08 NOTE — Telephone Encounter (Signed)
gve the pt her nov 2013 appt calendar °

## 2012-02-09 LAB — COMPREHENSIVE METABOLIC PANEL
ALT: 20 U/L (ref 0–35)
AST: 20 U/L (ref 0–37)
Albumin: 4.6 g/dL (ref 3.5–5.2)
Alkaline Phosphatase: 80 U/L (ref 39–117)
BUN: 16 mg/dL (ref 6–23)
CO2: 27 mEq/L (ref 19–32)
Calcium: 9.9 mg/dL (ref 8.4–10.5)
Chloride: 102 mEq/L (ref 96–112)
Creatinine, Ser: 0.83 mg/dL (ref 0.50–1.10)
Glucose, Bld: 95 mg/dL (ref 70–99)
Potassium: 4.8 mEq/L (ref 3.5–5.3)
Sodium: 138 mEq/L (ref 135–145)
Total Bilirubin: 0.8 mg/dL (ref 0.3–1.2)
Total Protein: 7.1 g/dL (ref 6.0–8.3)

## 2012-02-09 LAB — VITAMIN D 25 HYDROXY (VIT D DEFICIENCY, FRACTURES): Vit D, 25-Hydroxy: 53 ng/mL (ref 30–89)

## 2012-02-27 ENCOUNTER — Encounter (INDEPENDENT_AMBULATORY_CARE_PROVIDER_SITE_OTHER): Payer: Medicare Other

## 2012-02-27 DIAGNOSIS — R0609 Other forms of dyspnea: Secondary | ICD-10-CM

## 2012-02-27 DIAGNOSIS — R0989 Other specified symptoms and signs involving the circulatory and respiratory systems: Secondary | ICD-10-CM

## 2012-02-27 DIAGNOSIS — I739 Peripheral vascular disease, unspecified: Secondary | ICD-10-CM

## 2012-02-27 DIAGNOSIS — I70219 Atherosclerosis of native arteries of extremities with intermittent claudication, unspecified extremity: Secondary | ICD-10-CM

## 2012-03-04 ENCOUNTER — Telehealth: Payer: Self-pay | Admitting: *Deleted

## 2012-03-04 NOTE — Telephone Encounter (Signed)
Spoke with patient and she states she is not really being bothered with this at this time but if feels she needs to be seen will call back for an appointment.

## 2012-03-04 NOTE — Telephone Encounter (Signed)
Message copied by Burnell Blanks on Tue Mar 04, 2012  2:37 PM ------      Message from: Laurey Morale      Created: Tue Mar 04, 2012  2:23 PM       Stable severe proximal right SFA stenosis.  If she continues to have significant claudication symptoms, she could probably get some relief from stenting.  If she is having significant symptoms, would offer her peripheral vascular appointment with Dr. Kirke Corin.

## 2012-03-06 ENCOUNTER — Other Ambulatory Visit: Payer: Self-pay | Admitting: *Deleted

## 2012-03-06 DIAGNOSIS — C50119 Malignant neoplasm of central portion of unspecified female breast: Secondary | ICD-10-CM

## 2012-03-06 MED ORDER — ANASTROZOLE 1 MG PO TABS: 1.0000 mg | ORAL_TABLET | Freq: Every day | ORAL | Status: AC

## 2012-03-31 ENCOUNTER — Ambulatory Visit (INDEPENDENT_AMBULATORY_CARE_PROVIDER_SITE_OTHER): Payer: Medicare Other | Admitting: Internal Medicine

## 2012-03-31 ENCOUNTER — Encounter: Payer: Self-pay | Admitting: Internal Medicine

## 2012-03-31 VITALS — BP 130/80 | HR 86 | Temp 98.3°F | Resp 16 | Ht 66.0 in | Wt 144.0 lb

## 2012-03-31 DIAGNOSIS — J4489 Other specified chronic obstructive pulmonary disease: Secondary | ICD-10-CM

## 2012-03-31 DIAGNOSIS — F17200 Nicotine dependence, unspecified, uncomplicated: Secondary | ICD-10-CM

## 2012-03-31 DIAGNOSIS — Z720 Tobacco use: Secondary | ICD-10-CM

## 2012-03-31 DIAGNOSIS — J449 Chronic obstructive pulmonary disease, unspecified: Secondary | ICD-10-CM

## 2012-03-31 MED ORDER — PROMETHAZINE-CODEINE 6.25-10 MG/5ML PO SYRP: 5.0000 mL | ORAL_SOLUTION | Freq: Four times a day (QID) | ORAL | Status: AC | PRN

## 2012-03-31 MED ORDER — FLUTICASONE-SALMETEROL 100-50 MCG/DOSE IN AEPB: 1.0000 | INHALATION_SPRAY | Freq: Two times a day (BID) | RESPIRATORY_TRACT | Status: DC

## 2012-03-31 MED ORDER — ALBUTEROL SULFATE HFA 108 (90 BASE) MCG/ACT IN AERS: 2.0000 | INHALATION_SPRAY | Freq: Four times a day (QID) | RESPIRATORY_TRACT | Status: DC | PRN

## 2012-03-31 MED ORDER — BENZONATATE 100 MG PO CAPS: 100.0000 mg | ORAL_CAPSULE | Freq: Three times a day (TID) | ORAL | Status: AC | PRN

## 2012-03-31 MED ORDER — TIOTROPIUM BROMIDE MONOHYDRATE 18 MCG IN CAPS: 18.0000 ug | ORAL_CAPSULE | Freq: Every day | RESPIRATORY_TRACT | Status: DC

## 2012-03-31 NOTE — Patient Instructions (Addendum)
By pulmonary function testing you have restrictive airway disease (COPD) and emphysema (destruction of alveoli - the organ of gas exchange). You must STOP SMOKING for both lung health and vascular health.  MAINTENANCE MEDICATION - to be taken daily!! Advair 1 puff in the AM and PM every day; Spriva - 1 inhalation daily. RESCUE medication - albuterol inhaler 2 puffs every 6 hours as needed. If you need to use the RESCUE medication more than twice a week we need to look at adjusting your maintenance medication.  Vascular disease - legs, intestines, coronaries - YOU MUST STOP SMOKING.  Chronic Obstructive Pulmonary Disease Chronic obstructive pulmonary disease (COPD) is a condition in which airflow from the lungs is restricted. The lungs can never return to normal, but there are measures you can take which will improve them and make you feel better. CAUSES    Smoking.   Exposure to secondhand smoke.   Breathing in irritants (pollution, cigarette smoke, strong smells, aerosol sprays, paint fumes).   History of lung infections.  TREATMENT   Treatment focuses on making you comfortable (supportive care). Your caregiver may prescribe medications (inhaled or pills) to help improve your breathing. HOME CARE INSTRUCTIONS    If you smoke, stop smoking.   Avoid exposure to smoke, chemicals, and fumes that aggravate your breathing.   Take antibiotic medicines as directed by your caregiver.   Avoid medicines that dry up your system and slow down the elimination of secretions (antihistamines and cough syrups). This decreases respiratory capacity and may lead to infections.   Drink enough water and fluids to keep your urine clear or pale yellow. This loosens secretions.   Use humidifiers at home and at your bedside if they do not make breathing difficult.   Receive all protective vaccines your caregiver suggests, especially pneumococcal and influenza.   Use home oxygen as suggested.   Stay  active. Exercise and physical activity will help maintain your ability to do things you want to do.   Eat a healthy diet.  SEEK MEDICAL CARE IF:    You develop pus-like mucus (sputum).   Breathing is more labored or exercise becomes difficult to do.   You are running out of the medicine you take for your breathing.  SEEK IMMEDIATE MEDICAL CARE IF:    You have a rapid heart rate.   You have agitation, confusion, tremors, or are in a stupor (family members may need to observe this).   It becomes difficult to breathe.   You develop chest pain.   You have a fever.  MAKE SURE YOU:    Understand these instructions.   Will watch your condition.   Will get help right away if you are not doing well or get worse.  Document Released: 07/11/2005 Document Revised: 09/20/2011 Document Reviewed: 12/01/2010 [facility] Patient Information 2012 [place], [place].  Smoking Cessation, Tips for Success YOU CAN QUIT SMOKING If you are ready to quit smoking, congratulations! You have chosen to help yourself be healthier. Cigarettes bring nicotine, tar, carbon monoxide, and other irritants into your body. Your lungs, heart, and blood vessels will be able to work better without these poisons. There are many different ways to quit smoking. Nicotine gum, nicotine patches, a nicotine inhaler, or nicotine nasal spray can help with physical craving. Hypnosis, support groups, and medicines help break the habit of smoking. Here are some tips to help you quit for good.  Throw away all cigarettes.   Clean and remove all ashtrays from your home, work,  and car.   On a card, write down your reasons for quitting. Carry the card with you and read it when you get the urge to smoke.   Cleanse your body of nicotine. Drink enough water and fluids to keep your urine clear or pale yellow. Do this after quitting to flush the nicotine from your body.   Learn to predict your moods. Do not let a bad situation be your excuse  to have a cigarette. Some situations in your life might tempt you into wanting a cigarette.   Never have "just one" cigarette. It leads to wanting another and another. Remind yourself of your decision to quit.   Change habits associated with smoking. If you smoked while driving or when feeling stressed, try other activities to replace smoking. Stand up when drinking your coffee. Brush your teeth after eating. Sit in a different chair when you read the paper. Avoid alcohol while trying to quit, and try to drink fewer caffeinated beverages. Alcohol and caffeine may urge you to smoke.   Avoid foods and drinks that can trigger a desire to smoke, such as sugary or spicy foods and alcohol.   Ask people who smoke not to smoke around you.   Have something planned to do right after eating or having a cup of coffee. Take a walk or exercise to perk you up. This will help to keep you from overeating.   Try a relaxation exercise to calm you down and decrease your stress. Remember, you may be tense and nervous for the first 2 weeks after you quit, but this will pass.   Find new activities to keep your hands busy. Play with a pen, coin, or rubber band. Doodle or draw things on paper.   Brush your teeth right after eating. This will help cut down on the craving for the taste of tobacco after meals. You can try mouthwash, too.   Use oral substitutes, such as lemon drops, carrots, a cinnamon stick, or chewing gum, in place of cigarettes. Keep them handy so they are available when you have the urge to smoke.   When you have the urge to smoke, try deep breathing.   Designate your home as a nonsmoking area.   If you are a heavy smoker, ask your caregiver about a prescription for nicotine chewing gum. It can ease your withdrawal from nicotine.   Reward yourself. Set aside the cigarette money you save and buy yourself something nice.   Look for support from others. Join a support group or smoking cessation  program. Ask someone at home or at work to help you with your plan to quit smoking.   Always ask yourself, "Do I need this cigarette or is this just a reflex?" Tell yourself, "Today, I choose not to smoke," or "I do not want to smoke." You are reminding yourself of your decision to quit, even if you do smoke a cigarette.  HOW WILL I FEEL WHEN I QUIT SMOKING?  The benefits of not smoking start within days of quitting.   You may have symptoms of withdrawal because your body is used to nicotine (the addictive substance in cigarettes). You may crave cigarettes, be irritable, feel very hungry, cough often, get headaches, or have difficulty concentrating.   The withdrawal symptoms are only temporary. They are strongest when you first quit but will go away within 10 to 14 days.   When withdrawal symptoms occur, stay in control. Think about your reasons for quitting. Remind yourself  that these are signs that your body is healing and getting used to being without cigarettes.   Remember that withdrawal symptoms are easier to treat than the major diseases that smoking can cause.   Even after the withdrawal is over, expect periodic urges to smoke. However, these cravings are generally short-lived and will go away whether you smoke or not. Do not smoke!   If you relapse and smoke again, do not lose hope. Most smokers quit 3 times before they are successful.   If you relapse, do not give up! Plan ahead and think about what you will do the next time you get the urge to smoke.  LIFE AS A NONSMOKER: MAKE IT FOR A MONTH, MAKE IT FOR LIFE Day 1: Hang this page where you will see it every day. Day 2: Get rid of all ashtrays, matches, and lighters. Day 3: Drink water. Breathe deeply between sips. Day 4: Avoid places with smoke-filled air, such as bars, clubs, or the smoking section of restaurants. Day 5: Keep track of how much money you save by not smoking. Day 6: Avoid boredom. Keep a good book with you or go  to the movies. Day 7: Reward yourself! One week without smoking! Day 8: Make a dental appointment to get your teeth cleaned. Day 9: Decide how you will turn down a cigarette before it is offered to you. Day 10: Review your reasons for quitting. Day 11: Distract yourself. Stay active to keep your mind off smoking and to relieve tension. Take a walk, exercise, read a book, do a crossword puzzle, or try a new hobby. Day 12: Exercise. Get off the bus before your stop or use stairs instead of escalators. Day 13: Call on friends for support and encouragement. Day 14: Reward yourself! Two weeks without smoking! Day 15: Practice deep breathing exercises. Day 16: Bet a friend that you can stay a nonsmoker. Day 17: Ask to sit in nonsmoking sections of restaurants. Day 18: Hang up "No Smoking" signs. Day 19: Think of yourself as a nonsmoker. Day 20: Each morning, tell yourself you will not smoke. Day 21: Reward yourself! Three weeks without smoking! Day 22: Think of smoking in negative ways. Remember how it stains your teeth, gives you bad breath, and leaves you short of breath. Day 23: Eat a nutritious breakfast. Day 24:Do not relive your days as a smoker. Day 25: Hold a pencil in your hand when talking on the telephone. Day 26: Tell all your friends you do not smoke. Day 27: Think about how much better food tastes. Day 28: Remember, one cigarette is one too many. Day 29: Take up a hobby that will keep your hands busy. Day 30: Congratulations! One month without smoking! Give yourself a big reward. Your caregiver can direct you to community resources or hospitals for support, which may include:  Group support.   Education.   Hypnosis.   Subliminal therapy.  Document Released: 06/29/2004 Document Revised: 09/20/2011 Document Reviewed: 07/18/2009 [facility] Patient Information 2012 [place], [place].

## 2012-03-31 NOTE — Assessment & Plan Note (Signed)
Counseled on smoking as addiction and abuse. Vehemently encouraged to stop smoking with many graphic examples of target organ damage in setting of PVD and CAD.  Plan Keep smokers diary and develop cessation strategy  Provided tips for success  Return visit 4 weeks to launch cessation  (greater than 50% of 30 min visit spent on education and counseling)

## 2012-03-31 NOTE — Assessment & Plan Note (Signed)
Patient with marked COLPD/emphysema. She has poor insight to disease process or treatment. Educated to the same.  Plan Maintenance medications: advair 1 puff bid; spiriva 1 inhalation daily  Rescue medications: albuterol MDI 2 puffs q 6. Goal - albuterol 2 or fewer times a week. If requiring more than 4 treatments a day - Needs to see MD

## 2012-03-31 NOTE — Progress Notes (Signed)
Subjective:    Patient ID: [name], female    DOB: [birth year], 66 y.o.   MRN: [id number]  HPI [name] presents for persistent cough.She was diagnosed by Dr. Shirlee Latch with bronchitis and was treated with antibiotics, prednisone, combivent and cough syrup. She has continued to cough. She also has wheezing. Record reviewed: she has had PFTs in June '12: COPD with improvement with bronchodilators and emphysema with decreased DLCO2. She has been smoking since age 61. She has severe PVD right leg and SMA. She had CAD. She has been using Advair prn, combivent prn and she used to use Albuterol prn. She denies fever, chills, increased SOB/DOE, sputum production. She has no GI symptoms.  Past Medical History  Diagnosis Date  . COPD (chronic obstructive pulmonary disease)   . GERD (gastroesophageal reflux disease)   . Hypertension   . Diverticulitis     required colcectomy  . Osteoporosis   . Hyperlipidemia   . Emphysema of lung   . Cancer 2010    invasive ductal breast cancer; on tamoxifen  . Fibromyalgia 1995  . Hx of colonic polyps     Dr. Matthias Hughs -last study '11  . Peripheral vascular disease May '12 - CT angio    SMA chronic occlusion; stenosis of celiac trunk.  . Hypothyroidism   . Hearing loss of both ears     uses hearing aids  bilaterally  . Varicose veins of leg with swelling     varicose vein surgery - Dr. Guss Bunde   Past Surgical History  Procedure Date  . Appendectomy   . Breast surgery     mastectomy with reconstructive surgery  . Wedge resection     right upper lobe of lung as part of VAT-nodule/benign  . Colonoscopy w/ polypectomy   . Colectomy 2002    sigmoid  . Abdominal hysterectomy 1973    metorrhagia  . Cardiac catheterization '11    no obstructive coronary disease   Family History  Problem Relation Age of Onset  . Cancer Mother     colon  . COPD Mother     brown lung, emphysema  . Hypertension Mother   . Diabetes Mother   . Heart disease  Father     CAD/MI - sudden death  . Cancer Sister     fallopian tube; throat  . Hypertension Sister   . Heart disease Brother     CAD/MI early 40's  . Cancer Sister     throat cancer  . Cancer Sister     melanoma  . Hypertension Sister   . Cancer Sister     pancreatic  . Cancer Sister    History   Social History  . Marital Status: Married    Spouse Name: N/A    Number of Children: 3  . Years of Education: 12   Occupational History  . beautician     retired   Social History Main Topics  . Smoking status: Current Everyday Smoker -- 0.7 packs/day    Types: Cigarettes  . Smokeless tobacco: Never Used  . Alcohol Use: 6.0 oz/week    10 Cans of beer per week  . Drug Use: No  . Sexually Active: Not Currently   Other Topics Concern  . Not on file   Social History Narrative   HSG, beauty school. Married '69 -7 yrs/widowed; married '79 - 41yr/divorced; married '87.  2 sons - '70, '74, 1 srtep-son; 1 grandchild. 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 SO - good  health.  NO history of abuse.  ACP - full code and all heroic measures.     Current Outpatient Prescriptions on File Prior to Visit  Medication Sig Dispense Refill  . albuterol-ipratropium (COMBIVENT) 18-103 MCG/ACT inhaler Inhale 2 puffs into the lungs every 6 (six) hours as needed for wheezing.  1 Inhaler  0  . amitriptyline (ELAVIL) 25 MG tablet TAKE TWO TABLET BY MOUTH EVERY AT BEDTIME  60 tablet  6  . amLODipine (NORVASC) 10 MG tablet Take 10 mg by mouth daily.        Marland Kitchen anastrozole (ARIMIDEX) 1 MG tablet Take 1 tablet (1 mg total) by mouth daily.  30 tablet  5  . aspirin 81 MG tablet Take 81 mg by mouth daily.        Marland Kitchen atorvastatin (LIPITOR) 40 MG tablet TAKE ONE TABLET BY MOUTH ONE TIME DAILY  30 tablet  6  . B Complex Vitamins (VITAMIN-B COMPLEX PO) Take by mouth daily.        Marland Kitchen buPROPion (WELLBUTRIN SR) 150 MG 12 hr tablet TAKE ONE TABLET BY MOUTH TWICE DAILY  60 tablet  5  . Calcium Carbonate-Vitamin D (CALCIUM + D  PO) Take 600 mg by mouth daily.       . diclofenac (VOLTAREN) 75 MG EC tablet TAKE ONE TABLET BY MOUTH TWICE DAILY AFTER MEALS FOR ARTHRITIS  60 tablet  8  . LORazepam (ATIVAN) 2 MG tablet Take 1 tablet (2 mg total) by mouth every 8 (eight) hours as needed.  30 tablet  5  . metoprolol tartrate (LOPRESSOR) 25 MG tablet Take 25 mg by mouth 2 (two) times daily.        . NON FORMULARY advair inh -use regularly twice a day       . omeprazole (PRILOSEC) 40 MG capsule TAKE ONE CAPSULE BY MOUTH TWICE DAILY FOR GERD  60 capsule  7  . albuterol (PROVENTIL HFA;VENTOLIN HFA) 108 (90 BASE) MCG/ACT inhaler Inhale 2 puffs into the lungs every 6 (six) hours as needed for wheezing. As a RESCUE medication  1 Inhaler  2  . Fluticasone-Salmeterol (ADVAIR) 100-50 MCG/DOSE AEPB Inhale 1 puff into the lungs 2 (two) times daily. As a MAINTENANCE medication.  1 each  3  . tiotropium (SPIRIVA HANDIHALER) 18 MCG inhalation capsule Place 1 capsule (18 mcg total) into inhaler and inhale daily. As a MAINTENANCE medication  30 capsule  12      Review of Systems System review is negative for any constitutional, cardiac, pulmonary, GI or neuro symptoms or complaints other than as described in the HPI.     Objective:   Physical Exam Filed Vitals:   03/31/12 1143  BP: 130/80  Pulse: 86  Temp: 98.3 F (36.8 C)  Resp: 16   Wt Readings from Last 3 Encounters:  03/31/12 144 lb (65.318 kg)  02/08/12 147 lb 9.6 oz (66.951 kg)  01/28/12 146 lb 1.9 oz (66.28 kg)   Gen'l- WNWD white woman in no distress HEENT- normal Cor- RRR Pulm - no increased WOB, no rales or wheeze, no rhonchi, O2 sat 90% RA       Assessment & Plan:  Cough - smokers cough Plan - prom/cod 1 tsp q 6  Tessalon perles  For persistent cough - CT chest

## 2012-04-01 ENCOUNTER — Other Ambulatory Visit (INDEPENDENT_AMBULATORY_CARE_PROVIDER_SITE_OTHER): Payer: Medicare Other

## 2012-04-01 DIAGNOSIS — E785 Hyperlipidemia, unspecified: Secondary | ICD-10-CM

## 2012-04-01 DIAGNOSIS — R0609 Other forms of dyspnea: Secondary | ICD-10-CM

## 2012-04-01 DIAGNOSIS — R0989 Other specified symptoms and signs involving the circulatory and respiratory systems: Secondary | ICD-10-CM

## 2012-04-01 LAB — LIPID PANEL
Cholesterol: 172 mg/dL (ref 0–200)
HDL: 92.1 mg/dL (ref >39.00)
LDL Cholesterol: 63 mg/dL (ref 0–99)
Total CHOL/HDL Ratio: 2
Triglycerides: 87 mg/dL (ref 0.0–149.0)
VLDL: 17.4 mg/dL (ref 0.0–40.0)

## 2012-04-06 ENCOUNTER — Other Ambulatory Visit: Payer: Self-pay | Admitting: Cardiology

## 2012-04-06 ENCOUNTER — Other Ambulatory Visit: Payer: Self-pay | Admitting: Oncology

## 2012-04-08 ENCOUNTER — Other Ambulatory Visit: Payer: Self-pay | Admitting: *Deleted

## 2012-04-08 DIAGNOSIS — C50919 Malignant neoplasm of unspecified site of unspecified female breast: Secondary | ICD-10-CM

## 2012-04-08 MED ORDER — METOCLOPRAMIDE HCL 10 MG PO TABS: 10.0000 mg | ORAL_TABLET | Freq: Four times a day (QID) | ORAL | Status: DC

## 2012-04-11 ENCOUNTER — Other Ambulatory Visit: Payer: Self-pay | Admitting: *Deleted

## 2012-04-11 DIAGNOSIS — C50919 Malignant neoplasm of unspecified site of unspecified female breast: Secondary | ICD-10-CM

## 2012-04-11 MED ORDER — METOCLOPRAMIDE HCL 10 MG PO TABS: 10.0000 mg | ORAL_TABLET | Freq: Four times a day (QID) | ORAL | Status: DC

## 2012-04-11 NOTE — Telephone Encounter (Signed)
metoclopram to [facility]

## 2012-04-30 ENCOUNTER — Encounter: Payer: Self-pay | Admitting: Internal Medicine

## 2012-04-30 ENCOUNTER — Ambulatory Visit (INDEPENDENT_AMBULATORY_CARE_PROVIDER_SITE_OTHER): Payer: Medicare Other | Admitting: Internal Medicine

## 2012-04-30 VITALS — BP 122/80 | HR 79 | Temp 98.1°F | Resp 16 | Wt 148.0 lb

## 2012-04-30 DIAGNOSIS — J449 Chronic obstructive pulmonary disease, unspecified: Secondary | ICD-10-CM

## 2012-04-30 DIAGNOSIS — F17200 Nicotine dependence, unspecified, uncomplicated: Secondary | ICD-10-CM

## 2012-04-30 DIAGNOSIS — J4489 Other specified chronic obstructive pulmonary disease: Secondary | ICD-10-CM

## 2012-04-30 DIAGNOSIS — Z720 Tobacco use: Secondary | ICD-10-CM

## 2012-04-30 NOTE — Assessment & Plan Note (Signed)
She reports that she is doing better: breathing is better, cough is a lot less.  Plan Use Advair twice a day  Continue with spiriva - it will help performance: endurance.

## 2012-04-30 NOTE — Progress Notes (Signed)
  Subjective:    Patient ID: [name], female    DOB: 10/04/1946, 66 y.o.   MRN: [id number]  HPI Returns for follow up smoking cessation. She has done pretty well although she has had a slide back on occasion. She reports that her breathing is a lot better although she has stopped spiriva and is not using Advair BID on a regular basis.  PMH, FamHx and SocHx reviewed for any changes and relevance.    Review of Systems System review is negative for any constitutional, cardiac, pulmonary, GI or neuro symptoms or complaints other than as described in the HPI.     Objective:   Physical Exam Filed Vitals:   04/30/12 1320  BP: 122/80  Pulse: 79  Temp: 98.1 F (36.7 C)  Resp: 16   Gen'l WNWD white woman PUlm - no increased work of breathing, no wheezing but decreased breath sounds       Assessment & Plan:

## 2012-04-30 NOTE — Assessment & Plan Note (Signed)
Is doing well. She is advised that to back slide is not to fail at her effort. The nature of addiction is that the triggers sometimes win! That it does not change her status as a non-smoker.

## 2012-05-28 ENCOUNTER — Encounter: Payer: Self-pay | Admitting: *Deleted

## 2012-06-05 ENCOUNTER — Encounter: Payer: Self-pay | Admitting: Cardiology

## 2012-06-05 ENCOUNTER — Ambulatory Visit (INDEPENDENT_AMBULATORY_CARE_PROVIDER_SITE_OTHER): Payer: Medicare Other | Admitting: Cardiology

## 2012-06-05 VITALS — BP 134/86 | HR 80 | Ht 66.0 in | Wt 148.0 lb

## 2012-06-05 DIAGNOSIS — E785 Hyperlipidemia, unspecified: Secondary | ICD-10-CM

## 2012-06-05 DIAGNOSIS — J4489 Other specified chronic obstructive pulmonary disease: Secondary | ICD-10-CM

## 2012-06-05 DIAGNOSIS — J449 Chronic obstructive pulmonary disease, unspecified: Secondary | ICD-10-CM

## 2012-06-05 DIAGNOSIS — I739 Peripheral vascular disease, unspecified: Secondary | ICD-10-CM

## 2012-06-05 DIAGNOSIS — I2510 Atherosclerotic heart disease of native coronary artery without angina pectoris: Secondary | ICD-10-CM

## 2012-06-05 MED ORDER — CILOSTAZOL 100 MG PO TABS: 100.0000 mg | ORAL_TABLET | Freq: Two times a day (BID) | ORAL | Status: DC

## 2012-06-05 NOTE — Patient Instructions (Addendum)
Start Pletal (cilostazol) 100mg  twice a day.  You have been referred to Dr Kirke Corin for evaluation and management of your leg pain/PAD.  Your physician wants you to follow-up in: 6 months with Dr Shirlee Latch. (February 2014). You will receive a reminder letter in the mail two months in advance. If you don't receive a letter, please call our office to schedule the follow-up appointment.

## 2012-06-06 NOTE — Assessment & Plan Note (Signed)
She has quit smoking 

## 2012-06-06 NOTE — Assessment & Plan Note (Signed)
No further intestinal angina symptoms.  Holding off on surgery unless symptoms return.  

## 2012-06-06 NOTE — Assessment & Plan Note (Signed)
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  This has been bothering her more recently.  She has right calf claudication.   I will start her on cilostazol.  I am also going to refer her for peripheral vascular evaluation.

## 2012-06-06 NOTE — Assessment & Plan Note (Signed)
Goal LDL < 70 with vascular disease.  Recent lipids at goal.

## 2012-06-06 NOTE — Assessment & Plan Note (Signed)
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  Otherwise, no significant disease.  I do not think that this lesion is likely to cause significant symptoms and it is too small to intervene upon.  Regardless, she is not having any further chest pain now on metoprolol and amlodipine.  Continue ASA and statin.

## 2012-06-06 NOTE — Progress Notes (Signed)
Patient ID: [name], female   DOB: [birth year], 66 y.o.   MRN: [id number] PCP: Dr. Scotty Court  66 yo with history of smoking, HTN, COPD, hyperlipidemia, and mesenteric vascular disease returns for cardiology evaluation.  Patient had been having nocturnal and post-prandial abdominal cramping.  She had a mesenteric angiogram done in 5/12 by Dr. Imogene Burn, showing occluded celiac and SMA arteries with patent IMA.  She was planned for aorto-mesenteric bypass. She was referred for cardiology evaluation prior to surgery.   Given her exertional chest tightness and shortness of breath as well as known vascular disease, I set her up for left heart cath.  This was done in 5/12 and showed a 70-80% ostial stenosis of a small to moderate 2nd diagonal.  This was too small to intervene on and not likely to be particularly symptomatic.  Lower extremity arterial dopplers showed a severe focal proximal right SFA stenosis with collaterals from the PFA. She had a VATS for a right-sided lung nodule that showed granulomatous inflammation (no cancer).  She has quit smoking.   Breathing is much better since she quit smoking.  She is more active and has been noting more claudication.  She has bilateral R>L calf pain with walking up stairs or fast walking.  This does limit her exercise.  5/13 peripheral arterial doppler exam was stable compared to the past.  She denies chest pain.  She has minimal dyspnea now.    Labs (1/12): LDL 166, HDL 87, TSH decreased, AST 97, ALT 129, K 4.9, creatinine 0.6 Labs (5/12): K 4.8, creatinine 0.9, AST 25, ALT 37. Labs (7/12): HDL 98.6, LDL 112 Labs (9/12): K 3.7, creatinine 0.56, LFTs normal Labs (12/12): LDL 78, HDL 76, TGs 45 Labs (6/13): LDL 63, HDL 92  PMH: 1. Hypothyroidism 2. HTN 3. GERD 4. Hypothyroidism 5. OSA 6. 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  The IMA was patent.    8. Hyperlipidemia 9. COPD: PFTs (6/12) with moderate obstructive airways disease and response to bronchodilator.  10. Hyperlipidemia 11. Fibromyalgia 12. Breast cancer s/p mastectomy and chemotherapy in 2010.  13. CAD: LHC (5/12) with 70-80% ostial stenosis of a small-moderate 2nd diagonal.  This was too small to intervene upon and unlikely to cause significant symptoms.  14. PAD: Peripheral arterial dopplers (5/12) with severe focal proximal right SFA stenosis.  There were collaterals from the PFA.  Patient has claudication symptoms. Peripheral arterial dopplers in 5/13 were stable compared to 5/12.  15. 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 VATS 8/12 for right upper lobe nodule showed granulomatous material.   SH: Married, lives in [place], quit smoking 6/13.   FH: Father with MI at 84, brother with MI at 75  ROS: All systems reviewed and negative except as per HPI.   Current Outpatient Prescriptions  Medication Sig Dispense Refill  . albuterol (PROVENTIL HFA;VENTOLIN HFA) 108 (90 BASE) MCG/ACT inhaler Inhale 2 puffs into the lungs every 6 (six) hours as needed for wheezing. As a RESCUE medication  1 Inhaler  2  . albuterol-ipratropium (COMBIVENT) 18-103 MCG/ACT inhaler Inhale 2 puffs into the lungs every 6 (six) hours as needed for wheezing.  1 Inhaler  0  . amitriptyline (ELAVIL) 25 MG tablet TAKE TWO TABLET BY MOUTH EVERY AT BEDTIME  60 tablet  6  . amLODipine (NORVASC) 10 MG tablet TAKE ONE TABLET BY MOUTH ONE TIME DAILY  30 tablet  3  . aspirin 81 MG tablet Take 81 mg by mouth daily.        Marland Kitchen atorvastatin (LIPITOR) 40 MG tablet TAKE ONE TABLET BY MOUTH ONE TIME DAILY  30 tablet  6  . B Complex Vitamins (VITAMIN-B COMPLEX PO) Take by mouth daily.        Marland Kitchen buPROPion (WELLBUTRIN SR) 150 MG 12 hr tablet TAKE ONE TABLET BY MOUTH TWICE DAILY  60 tablet  5  . Calcium Carbonate-Vitamin D (CALCIUM + D PO) Take 600 mg by mouth daily.        . diclofenac (VOLTAREN) 75 MG EC tablet TAKE ONE TABLET BY MOUTH TWICE DAILY AFTER MEALS FOR ARTHRITIS  60 tablet  8  . Fluticasone-Salmeterol (ADVAIR) 100-50 MCG/DOSE AEPB Inhale 1 puff into the lungs 2 (two) times daily. As a MAINTENANCE medication.  1 each  3  . LORazepam (ATIVAN) 2 MG tablet Take 1 tablet (2 mg total) by mouth every 8 (eight) hours as needed.  30 tablet  5  . metoCLOPramide (REGLAN) 10 MG tablet Take 10 mg by mouth 4 (four) times daily. Thirty minutes before meals and bedtime      . metoprolol tartrate (LOPRESSOR) 25 MG tablet Take 25 mg by mouth 2 (two) times daily.        . NON FORMULARY advair inh -use regularly twice a day       . omeprazole (PRILOSEC) 40 MG capsule TAKE ONE CAPSULE BY MOUTH TWICE DAILY FOR GERD  60 capsule  7  . tiotropium (SPIRIVA HANDIHALER) 18 MCG inhalation capsule Place 1 capsule (18 mcg total) into inhaler and inhale daily. As a MAINTENANCE medication  30 capsule  12  . cilostazol (PLETAL) 100 MG tablet Take 1 tablet (100 mg total) by mouth 2 (two) times daily.  60 tablet  6    BP 134/86  Pulse 80  Ht 5\' 6"  (1.676 m)  Wt 148 lb (67.132 kg)  BMI 23.89 kg/m2 General: NAD, thin Neck: No JVD, no thyromegaly or thyroid nodule.  Lungs: Distant breath sounds bilaterally. CV: Nondisplaced PMI.  Heart regular S1/S2, no S3/S4, no murmur.  No peripheral edema.  No carotid bruit.  Difficult to palpate pedal pulses right foot.  Left foot with no PT pulse palpable but 2+ DP.  Abdomen: Soft, nontender, no hepatosplenomegaly, no distention.  Neurologic: Alert and oriented x 3.  Psych: Normal affect. Extremities: No clubbing or cyanosis.

## 2012-06-18 ENCOUNTER — Ambulatory Visit (INDEPENDENT_AMBULATORY_CARE_PROVIDER_SITE_OTHER): Payer: Medicare Other | Admitting: Cardiovascular Disease

## 2012-06-18 ENCOUNTER — Encounter: Payer: Self-pay | Admitting: Cardiovascular Disease

## 2012-06-18 VITALS — BP 131/76 | HR 97 | Ht 66.0 in | Wt 150.8 lb

## 2012-06-18 DIAGNOSIS — I739 Peripheral vascular disease, unspecified: Secondary | ICD-10-CM

## 2012-06-18 NOTE — Patient Instructions (Addendum)
Your physician wants you to follow-up in: May 2014 a few days after the ABI and Duplex.   You will receive a reminder letter in the mail two months in advance. If you don't receive a letter, please call our office to schedule the follow-up appointment.  Your physician has requested that you have an ankle brachial index (ABI) in May 2014. During this test an ultrasound and blood pressure cuff are used to evaluate the arteries that supply the arms and legs with blood. Allow thirty minutes for this exam. There are no restrictions or special instructions.  Your physician has requested that you have a lower or upper extremity arterial duplex in May 2014.. This test is an ultrasound of the arteries in the legs or arms. It looks at arterial blood flow in the legs and arms. Allow one hour for Lower and Upper Arterial scans. There are no restrictions or special instructions

## 2012-06-19 ENCOUNTER — Encounter: Payer: Self-pay | Admitting: Cardiovascular Disease

## 2012-06-19 NOTE — Progress Notes (Signed)
HPI  66 yo with history of smoking, HTN, COPD, hyperlipidemia, PAD,  and mesenteric vascular disease  who was referred by Dr. Shirlee Latch for evaluation and management of PAD. The patient has known history of mesenteric ischemia which is being followed by Dr. Imogene Burn. She had cardiac catheterization in May of this year which showed  a 70-80% ostial stenosis of a small to moderate 2nd diagonal. Lower extremity arterial dopplers showed a severe focal proximal right SFA stenosis with collaterals from the PFA. This was noted on previous study from last year as well. The patient reports discomfort in both legs mostly at night. She reports mild discomfort with walking slightly worse on the right side but the discomfort does not force her to stop. Her ABI was only mildly reduced on the right side at 0.81. She was started recently on Pletal and reports improvement in symptoms.  Allergies  Allergen Reactions  . Sulfonamide Derivatives Swelling     Current Outpatient Prescriptions on File Prior to Visit  Medication Sig Dispense Refill  . albuterol (PROVENTIL HFA;VENTOLIN HFA) 108 (90 BASE) MCG/ACT inhaler Inhale 2 puffs into the lungs every 6 (six) hours as needed for wheezing. As a RESCUE medication  1 Inhaler  2  . albuterol-ipratropium (COMBIVENT) 18-103 MCG/ACT inhaler Inhale 2 puffs into the lungs every 6 (six) hours as needed for wheezing.  1 Inhaler  0  . amitriptyline (ELAVIL) 25 MG tablet TAKE TWO TABLET BY MOUTH EVERY AT BEDTIME  60 tablet  6  . amLODipine (NORVASC) 10 MG tablet TAKE ONE TABLET BY MOUTH ONE TIME DAILY  30 tablet  3  . aspirin 81 MG tablet Take 81 mg by mouth daily.        Marland Kitchen atorvastatin (LIPITOR) 40 MG tablet TAKE ONE TABLET BY MOUTH ONE TIME DAILY  30 tablet  6  . B Complex Vitamins (VITAMIN-B COMPLEX PO) Take by mouth daily.        Marland Kitchen buPROPion (WELLBUTRIN SR) 150 MG 12 hr tablet TAKE ONE TABLET BY MOUTH TWICE DAILY  60 tablet  5  . Calcium Carbonate-Vitamin D (CALCIUM + D PO)  Take 600 mg by mouth daily.       . cilostazol (PLETAL) 100 MG tablet Take 1 tablet (100 mg total) by mouth 2 (two) times daily.  60 tablet  6  . diclofenac (VOLTAREN) 75 MG EC tablet TAKE ONE TABLET BY MOUTH TWICE DAILY AFTER MEALS FOR ARTHRITIS  60 tablet  8  . Fluticasone-Salmeterol (ADVAIR) 100-50 MCG/DOSE AEPB Inhale 1 puff into the lungs 2 (two) times daily. As a MAINTENANCE medication.  1 each  3  . LORazepam (ATIVAN) 2 MG tablet Take 1 tablet (2 mg total) by mouth every 8 (eight) hours as needed.  30 tablet  5  . metoCLOPramide (REGLAN) 10 MG tablet Take 10 mg by mouth 4 (four) times daily. Thirty minutes before meals and bedtime      . metoprolol tartrate (LOPRESSOR) 25 MG tablet Take 25 mg by mouth 2 (two) times daily.        Marland Kitchen omeprazole (PRILOSEC) 40 MG capsule TAKE ONE CAPSULE BY MOUTH TWICE DAILY FOR GERD  60 capsule  7  . tiotropium (SPIRIVA HANDIHALER) 18 MCG inhalation capsule Place 1 capsule (18 mcg total) into inhaler and inhale daily. As a MAINTENANCE medication  30 capsule  12     Past Medical History  Diagnosis Date  . COPD (chronic obstructive pulmonary disease)   . GERD (gastroesophageal reflux  disease)   . Hypertension   . Diverticulitis     required colcectomy  . Osteoporosis   . Hyperlipidemia   . Emphysema of lung   . Cancer 2010    invasive ductal breast cancer; on tamoxifen  . Fibromyalgia 1995  . Hx of colonic polyps     Dr. Matthias Hughs -last study '11  . Peripheral vascular disease May '12 - CT angio    SMA chronic occlusion; stenosis of celiac trunk.  . Hypothyroidism   . Hearing loss of both ears     uses hearing aids  bilaterally  . Varicose veins of leg with swelling     varicose vein surgery - Dr. Guss Bunde     Past Surgical History  Procedure Date  . Appendectomy   . Breast surgery     mastectomy with reconstructive surgery  . Wedge resection     right upper lobe of lung as part of VAT-nodule/benign  . Colonoscopy w/ polypectomy   .  Colectomy 2002    sigmoid  . Abdominal hysterectomy 1973    metorrhagia  . Cardiac catheterization '11    no obstructive coronary disease     Family History  Problem Relation Age of Onset  . Colon cancer Mother     colon  . COPD Mother     brown lung  . Hypertension Mother   . Diabetes Mother   . Coronary artery disease Father   . Cancer Sister     fallopian tube  . Hypertension Sister   . Coronary artery disease Brother   . Throat cancer Sister   . Melanoma Sister   . Hypertension Sister   . Pancreatic cancer Sister   . Cancer Sister   . Heart attack Father   . Heart attack Brother     early 55's  . Sudden death Father   . Emphysema Mother      History   Social History  . Marital Status: Married    Spouse Name: N/A    Number of Children: 3  . Years of Education: 12   Occupational History  . beautician     retired   Social History Main Topics  . Smoking status: Former Smoker -- 0.7 packs/day    Types: Cigarettes    Quit date: 03/18/2012  . Smokeless tobacco: Never Used   Comment: smokes less   . Alcohol Use: 6.0 oz/week    10 Cans of beer per week  . Drug Use: No  . Sexually Active: Not Currently   Other Topics Concern  . Not on file   Social History Narrative   HSG, beauty school. Married '69 -7 yrs/widowed; married '79 - 59yr/divorced; married '87.  2 sons - '70, '74, 1 srtep-son; 1 grandchild. 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 SO - good health.  NO history of abuse.  ACP - full code and all heroic measures.      PHYSICAL EXAM   BP 131/76  Pulse 97  Ht 5\' 6"  (1.676 m)  Wt 150 lb 12.8 oz (68.402 kg)  BMI 24.34 kg/m2Constitutional: She is oriented to person, place, and time. She appears well-developed and well-nourished. No distress.  HENT: No nasal discharge.  Head: Normocephalic and atraumatic.  Eyes: Pupils are equal and round. Right eye exhibits no discharge. Left eye exhibits no discharge.  Neck: Normal range of motion. Neck supple. No  JVD present. No thyromegaly present.  Cardiovascular: Normal rate, regular rhythm, normal heart sounds. Exam reveals no gallop and  no friction rub. No murmur heard.  Pulmonary/Chest: Effort normal and breath sounds normal. No stridor. No respiratory distress. She has no wheezes. She has no rales. She exhibits no tenderness.  Abdominal: Soft. Bowel sounds are normal. She exhibits no distension. There is no tenderness. There is no rebound and no guarding.  Musculoskeletal: Normal range of motion. She exhibits no edema and no tenderness.  Neurological: She is alert and oriented to person, place, and time. Coordination normal.  Skin: Skin is warm and dry. No rash noted. She is not diaphoretic. No erythema. No pallor.  Psychiatric: She has a normal mood and affect. Her behavior is normal. Judgment and thought content normal.  Vascular: Femoral pulses normal bilaterally. DP: + On the right side and +2 on the left side, PT: Absent on the right side and +2 on the left side.    ASSESSMENT AND PLAN

## 2012-06-19 NOTE — Assessment & Plan Note (Signed)
The patient has evidence of peripheral arterial disease on the right side. Her claudication seems to be mild at this time and is not lifestyle limiting. It appears that her symptoms also improved after the addition of Pletal. She quit smoking this year. Her risk factors are much better controlled than before. Given that her symptoms are currently not lifestyle limiting and the ABI was only mildly reduced at 0.81, I favor continued medical therapy. I encouraged her to increase her walking frequency and distance. The stenosis in the right SFA is very close to the ostium and might be somewhat challenging from a technical standpoint especially due to the proximity to the profunda. I recommend a yearly lower extremity arterial duplex ultrasound and ABI.

## 2012-07-09 ENCOUNTER — Other Ambulatory Visit: Payer: Self-pay | Admitting: Internal Medicine

## 2012-07-09 ENCOUNTER — Other Ambulatory Visit: Payer: Self-pay | Admitting: General Practice

## 2012-07-22 ENCOUNTER — Other Ambulatory Visit: Payer: Self-pay | Admitting: *Deleted

## 2012-07-22 MED ORDER — AMITRIPTYLINE HCL 25 MG PO TABS: 25.0000 mg | ORAL_TABLET | Freq: Every day | ORAL | Status: DC

## 2012-08-10 ENCOUNTER — Other Ambulatory Visit: Payer: Self-pay | Admitting: Cardiology

## 2012-08-10 ENCOUNTER — Other Ambulatory Visit: Payer: Self-pay | Admitting: Internal Medicine

## 2012-08-11 ENCOUNTER — Other Ambulatory Visit: Payer: Self-pay | Admitting: *Deleted

## 2012-08-11 MED ORDER — LORAZEPAM 2 MG PO TABS: 2.0000 mg | ORAL_TABLET | Freq: Three times a day (TID) | ORAL | Status: DC | PRN

## 2012-08-11 NOTE — Telephone Encounter (Signed)
R'cd fax from [facility] for refill of Lorazepam-last written 01/21/2012 #30 with 5 refills-please advise.

## 2012-08-11 NOTE — Telephone Encounter (Signed)
Rx called in to [facility] 

## 2012-08-21 ENCOUNTER — Other Ambulatory Visit (HOSPITAL_BASED_OUTPATIENT_CLINIC_OR_DEPARTMENT_OTHER): Payer: Medicare Other | Admitting: Lab

## 2012-08-21 ENCOUNTER — Ambulatory Visit (HOSPITAL_BASED_OUTPATIENT_CLINIC_OR_DEPARTMENT_OTHER): Payer: Medicare Other | Admitting: Oncology

## 2012-08-21 VITALS — BP 132/72 | HR 98 | Temp 98.3°F | Resp 20 | Ht 66.0 in | Wt 150.8 lb

## 2012-08-21 DIAGNOSIS — E559 Vitamin D deficiency, unspecified: Secondary | ICD-10-CM

## 2012-08-21 DIAGNOSIS — Z170 Estrogen receptor positive status [ER+]: Secondary | ICD-10-CM

## 2012-08-21 DIAGNOSIS — C50919 Malignant neoplasm of unspecified site of unspecified female breast: Secondary | ICD-10-CM

## 2012-08-21 LAB — COMPREHENSIVE METABOLIC PANEL (CC13)
ALT: 31 U/L (ref 0–55)
AST: 24 U/L (ref 5–34)
Albumin: 4.1 g/dL (ref 3.5–5.0)
Alkaline Phosphatase: 82 U/L (ref 40–150)
BUN: 15 mg/dL (ref 7.0–26.0)
CO2: 24 mEq/L (ref 22–29)
Calcium: 10 mg/dL (ref 8.4–10.4)
Chloride: 108 mEq/L — ABNORMAL HIGH (ref 98–107)
Creatinine: 1 mg/dL (ref 0.6–1.1)
Glucose: 101 mg/dl — ABNORMAL HIGH (ref 70–99)
Potassium: 3.6 mEq/L (ref 3.5–5.1)
Sodium: 140 mEq/L (ref 136–145)
Total Bilirubin: 0.41 mg/dL (ref 0.20–1.20)
Total Protein: 7.6 g/dL (ref 6.4–8.3)

## 2012-08-21 LAB — CBC WITH DIFFERENTIAL/PLATELET
BASO%: 0.7 % (ref 0.0–2.0)
Basophils Absolute: 0 10*3/uL (ref 0.0–0.1)
EOS%: 3.4 % (ref 0.0–7.0)
Eosinophils Absolute: 0.2 10*3/uL (ref 0.0–0.5)
HCT: 43.2 % (ref 34.8–46.6)
HGB: 14.9 g/dL (ref 11.6–15.9)
LYMPH%: 23.4 % (ref 14.0–49.7)
MCH: 32 pg (ref 25.1–34.0)
MCHC: 34.5 g/dL (ref 31.5–36.0)
MCV: 92.5 fL (ref 79.5–101.0)
MONO#: 0.7 10*3/uL (ref 0.1–0.9)
MONO%: 11.2 % (ref 0.0–14.0)
NEUT#: 4 10*3/uL (ref 1.5–6.5)
NEUT%: 61.3 % (ref 38.4–76.8)
Platelets: 313 10*3/uL (ref 145–400)
RBC: 4.67 10*6/uL (ref 3.70–5.45)
RDW: 14.1 % (ref 11.2–14.5)
WBC: 6.5 10*3/uL (ref 3.9–10.3)
lymph#: 1.5 10*3/uL (ref 0.9–3.3)

## 2012-08-22 LAB — VITAMIN D 25 HYDROXY (VIT D DEFICIENCY, FRACTURES): Vit D, 25-Hydroxy: 50 ng/mL (ref 30–89)

## 2012-08-24 NOTE — Progress Notes (Signed)
Hematology and Oncology Follow Up Visit  [name] [id number] 1946/07/03 66 y.o. 08/24/2012 6:49 PM   DIAGNOSIS:  T1 C. N0 ER/PR positive breast cancer status post mastectomy with myocutaneous flap 09/19/2009  And 4 cycles of TC chemotherapy, followed by aI therapy History vasculopathy PAST THERAPY:  As above, history of vasculopathy with mesenteric ischemia.  Interim History:  Patient returns for follow. General doing pretty well. Her last mammogram was in December and is to have another one this coming December. Her most recent bone density test was done in March 2012 and was within normal limits the  Medications: I have reviewed the patient's current medications.  Allergies:  Allergies  Allergen Reactions  . Sulfonamide Derivatives Swelling    Past Medical History, Surgical history, Social history, and Family History were reviewed and updated.  Review of Systems: Constitutional:  Negative for fever, chills, night sweats, anorexia, weight loss, pain. Cardiovascular: no chest pain or dyspnea on exertion Respiratory: negative Neurological: negative Dermatological: negative ENT: negative Skin Gastrointestinal: negative Genito-Urinary: negative Hematological and Lymphatic: negative Breast: negative Musculoskeletal: negative Remaining ROS negative.  Physical Exam:  Blood pressure 132/72, pulse 98, temperature 98.3 F (36.8 C), resp. rate 20, height 5\' 6"  (1.676 m), weight 150 lb 12.8 oz (68.402 kg).  ECOG: 0   HEENT:  Sclerae anicteric, conjunctivae pink.  Oropharynx clear.  No mucositis or candidiasis.  Nodes:  No cervical, supraclavicular, or axillary lymphadenopathy palpated.  Breast Exam:  Right breast status post mastectomy with myocutaneous flap and implant on the left side both breasts are up to be free of any masses both zone negative..  Lungs:  Clea to auscultation bilaterally.  No crackles, rhonchi, or wheezes.  Heart:  Regular rate and rhythm.  Abdomen:   Soft, nontender.  Positive bowel sounds.  No organomegaly or masses palpated.  Musculoskeletal:  No focal spinal tenderness to palpation.  Extremities:  Benign.  No peripheral edema or cyanosis.  Skin:  Benign.  Neuro:  Nonfocal.    Lab Results: Lab Results  Component Value Date   WBC 6.5 08/21/2012   HGB 14.9 08/21/2012   HCT 43.2 08/21/2012   MCV 92.5 08/21/2012   PLT 313 08/21/2012     Chemistry      Component Value Date/Time   NA 140 08/21/2012 1501   NA 138 02/08/2012 1239   NA 134 12/08/2010 0858   K 3.6 08/21/2012 1501   K 4.8 02/08/2012 1239   K 5.2* 12/08/2010 0858   CL 108* 08/21/2012 1501   CL 102 02/08/2012 1239   CL 101 12/08/2010 0858   CO2 24 08/21/2012 1501   CO2 27 02/08/2012 1239   CO2 22 12/08/2010 0858   BUN 15.0 08/21/2012 1501   BUN 16 02/08/2012 1239   BUN 30* 12/08/2010 0858   CREATININE 1.0 08/21/2012 1501   CREATININE 0.83 02/08/2012 1239   CREATININE 0.92 06/04/2011 1615      Component Value Date/Time   CALCIUM 10.0 08/21/2012 1501   CALCIUM 9.9 02/08/2012 1239   CALCIUM 10.3 05/16/2011 1414   CALCIUM 9.9 12/08/2010 0858   ALKPHOS 82 08/21/2012 1501   ALKPHOS 80 02/08/2012 1239   ALKPHOS 97* 12/08/2010 0858   AST 24 08/21/2012 1501   AST 20 02/08/2012 1239   AST 41* 12/08/2010 0858   ALT 31 08/21/2012 1501   ALT 20 02/08/2012 1239   BILITOT 0.41 08/21/2012 1501   BILITOT 0.8 02/08/2012 1239   BILITOT 0.60 12/08/2010 6962  Radiological Studies:  No results found.   IMPRESSIONS AND PLAN: A 66 y.o. 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 Patient appears doing well. She has other comorbid problems. We will schedule see her back in followup in 6 months time with appropriate imaging studies.  Spent more than half the time coordinating care, as well as discussion of BMI and its implications.      Mohamedamin Nifong 11/10/20136:49 PM Cell [id number]

## 2012-10-16 ENCOUNTER — Other Ambulatory Visit: Payer: Self-pay | Admitting: Cardiology

## 2012-10-16 ENCOUNTER — Other Ambulatory Visit: Payer: Self-pay | Admitting: Oncology

## 2012-10-16 ENCOUNTER — Other Ambulatory Visit: Payer: Self-pay | Admitting: Internal Medicine

## 2012-10-17 ENCOUNTER — Other Ambulatory Visit: Payer: Self-pay | Admitting: *Deleted

## 2012-11-12 NOTE — Progress Notes (Signed)
ID: Rhett Bannister  DOB: 20-May-1946  MR#: [id number]  CSN#: [id number] DOS: 02/08/12  PROBLEM:  T1c N0, ER, PR positive; HER-2 negative breast cancer.  Status post mastectomy, sentinel lymph node dissection2/4/10,  followed by latissimus dorsi, myocutaneous flap, status post 4 cycles of every 3-week Taxotere Cytoxan in the adjuvant setting.  Femara since October 2010 and now on Arimidex currently on hold for elevated LFTs.  Interval History:   Lonni returns for f/u, she is doing well, without complaints, she has had no intercurrent illness or hospitilizations.   ROS:  14 point ROS was noncontributory.  Allergies  Allergen Reactions  . Sulfonamide Derivatives Swelling    Current Outpatient Prescriptions  Medication Sig Dispense Refill  . albuterol-ipratropium (COMBIVENT) 18-103 MCG/ACT inhaler Inhale 2 puffs into the lungs every 6 (six) hours as needed for wheezing.  1 Inhaler  0  . aspirin 81 MG tablet Take 81 mg by mouth daily.        . B Complex Vitamins (VITAMIN-B COMPLEX PO) Take by mouth daily.        . Calcium Carbonate-Vitamin D (CALCIUM + D PO) Take 600 mg by mouth daily.       . metoprolol tartrate (LOPRESSOR) 25 MG tablet Take 25 mg by mouth 2 (two) times daily.        Marland Kitchen albuterol (PROVENTIL HFA;VENTOLIN HFA) 108 (90 BASE) MCG/ACT inhaler Inhale 2 puffs into the lungs every 6 (six) hours as needed for wheezing. As a RESCUE medication  1 Inhaler  2  . amitriptyline (ELAVIL) 25 MG tablet Take 1 tablet (25 mg total) by mouth at bedtime.  60 tablet  6  . amitriptyline (ELAVIL) 25 MG tablet TAKE TWO TABLET BY MOUTH EVERY AT BEDTIME  60 tablet  5  . amLODipine (NORVASC) 10 MG tablet TAKE ONE TABLET BY MOUTH ONE TIME DAILY  30 tablet  2  . anastrozole (ARIMIDEX) 1 MG tablet TAKE ONE TABLET BY MOUTH ONE TIME DAILY  30 tablet  2  . atorvastatin (LIPITOR) 40 MG tablet TAKE ONE TABLET BY MOUTH ONE TIME DAILY  30 tablet  5  . buPROPion (WELLBUTRIN SR) 150 MG 12 hr tablet TAKE ONE TABLET BY MOUTH  TWICE DAILY  60 tablet  4  . cilostazol (PLETAL) 100 MG tablet Take 1 tablet (100 mg total) by mouth 2 (two) times daily.  60 tablet  6  . diclofenac (VOLTAREN) 75 MG EC tablet TAKE ONE TABLET BY MOUTH TWICE A DAY FOR ARTHRITIS  60 tablet  5  . Fluticasone-Salmeterol (ADVAIR) 100-50 MCG/DOSE AEPB Inhale 1 puff into the lungs 2 (two) times daily. As a MAINTENANCE medication.  1 each  3  . levothyroxine (SYNTHROID, LEVOTHROID) 112 MCG tablet TAKE ONE TABLET BY MOUTH ONE TIME DAILY  30 tablet  5  . LORazepam (ATIVAN) 2 MG tablet Take 1 tablet (2 mg total) by mouth every 8 (eight) hours as needed.  30 tablet  5  . metoCLOPramide (REGLAN) 10 MG tablet TAKE 1 TABLET BY MOUTH FOUR TIMES DAILY THIRTY MINUTES BEFORE MEALSAND BEDTIME  120 tablet  2  . omeprazole (PRILOSEC) 40 MG capsule TAKE ONE CAPSULE BY MOUTH TWICE DAILY  60 capsule  6  . tiotropium (SPIRIVA HANDIHALER) 18 MCG inhalation capsule Place 1 capsule (18 mcg total) into inhaler and inhale daily. As a MAINTENANCE medication  30 capsule  12     Objective:  Filed Vitals:   02/08/12 1307  BP: 183/79  Pulse: 74  Temp:  98.1 F (36.7 C)    BMI: Body mass index is 23.82 kg/(m^2).   ECOG FS:  Physical Exam:   Sclerae unicteric  Oropharynx clear  No peripheral adenopathy  Lungs clear -- no rales or rhonchi  Heart regular rate and rhythm  Abdomen benign  MSK no focal spinal tenderness, no peripheral edema  Neuro nonfocal  Breast exam: s/p reconstruction  With normal contralateral breast and axilla.  Lab Results:      Chemistry      Component Value Date/Time   NA 140 08/21/2012 1501   NA 138 02/08/2012 1239   NA 134 12/08/2010 0858   K 3.6 08/21/2012 1501   K 4.8 02/08/2012 1239   K 5.2* 12/08/2010 0858   CL 108* 08/21/2012 1501   CL 102 02/08/2012 1239   CL 101 12/08/2010 0858   CO2 24 08/21/2012 1501   CO2 27 02/08/2012 1239   CO2 22 12/08/2010 0858   BUN 15.0 08/21/2012 1501   BUN 16 02/08/2012 1239   BUN 30* 12/08/2010 0858    CREATININE 1.0 08/21/2012 1501   CREATININE 0.83 02/08/2012 1239   CREATININE 0.92 06/04/2011 1615      Component Value Date/Time   CALCIUM 10.0 08/21/2012 1501   CALCIUM 9.9 02/08/2012 1239   CALCIUM 10.3 05/16/2011 1414   CALCIUM 9.9 12/08/2010 0858   ALKPHOS 82 08/21/2012 1501   ALKPHOS 80 02/08/2012 1239   ALKPHOS 97* 12/08/2010 0858   AST 24 08/21/2012 1501   AST 20 02/08/2012 1239   AST 41* 12/08/2010 0858   ALT 31 08/21/2012 1501   ALT 20 02/08/2012 1239   BILITOT 0.41 08/21/2012 1501   BILITOT 0.8 02/08/2012 1239   BILITOT 0.60 12/08/2010 0858       Lab Results  Component Value Date   WBC 6.5 08/21/2012   HGB 14.9 08/21/2012   HCT 43.2 08/21/2012   MCV 92.5 08/21/2012   PLT 313 08/21/2012   NEUTROABS 4.0 08/21/2012    Studies/Results:  No results found.  Assessment: 67 yo woman with hx of node negative breast cancer, s/p mastectomy, for involved margins, adjuvant chemotherapy, and shorter course of AI. 3rd yr of f/u.    Plan: f/u 6 months with imaging studies        Marcelene Weidemann 11/12/2012

## 2012-11-20 ENCOUNTER — Other Ambulatory Visit: Payer: Self-pay | Admitting: Cardiology

## 2012-12-11 ENCOUNTER — Other Ambulatory Visit: Payer: Self-pay | Admitting: *Deleted

## 2012-12-11 MED ORDER — LORAZEPAM 2 MG PO TABS: 2.0000 mg | ORAL_TABLET | Freq: Three times a day (TID) | ORAL | Status: DC | PRN

## 2012-12-17 ENCOUNTER — Other Ambulatory Visit: Payer: Self-pay | Admitting: *Deleted

## 2012-12-17 NOTE — Telephone Encounter (Signed)
Last visit July '13 - 1 month refill and needs ov

## 2012-12-18 MED ORDER — LORAZEPAM 2 MG PO TABS: 2.0000 mg | ORAL_TABLET | Freq: Three times a day (TID) | ORAL | Status: DC | PRN

## 2012-12-30 ENCOUNTER — Other Ambulatory Visit: Payer: Self-pay

## 2012-12-30 MED ORDER — PROMETHAZINE-CODEINE 6.25-10 MG/5ML PO SYRP: 5.0000 mL | ORAL_SOLUTION | ORAL | Status: DC | PRN

## 2012-12-30 NOTE — Telephone Encounter (Signed)
Promethazine w/codeine called to pharmacy

## 2013-01-09 ENCOUNTER — Telehealth: Payer: Self-pay | Admitting: *Deleted

## 2013-01-09 NOTE — Telephone Encounter (Signed)
Confirmed appt date and time 01/13/13 at 1:15 to see Christy Medico, NP for f/u.  Pt will continue care will Dr. [name].

## 2013-01-13 ENCOUNTER — Ambulatory Visit (HOSPITAL_BASED_OUTPATIENT_CLINIC_OR_DEPARTMENT_OTHER): Payer: Medicare Other | Admitting: Nurse Practitioner

## 2013-01-13 ENCOUNTER — Telehealth: Payer: Self-pay | Admitting: *Deleted

## 2013-01-13 ENCOUNTER — Encounter: Payer: Self-pay | Admitting: Nurse Practitioner

## 2013-01-13 VITALS — BP 137/83 | HR 88 | Temp 98.1°F | Resp 20 | Ht 66.0 in | Wt 146.9 lb

## 2013-01-13 DIAGNOSIS — C50919 Malignant neoplasm of unspecified site of unspecified female breast: Secondary | ICD-10-CM

## 2013-01-13 DIAGNOSIS — Z9010 Acquired absence of unspecified breast and nipple: Secondary | ICD-10-CM

## 2013-01-13 DIAGNOSIS — Z170 Estrogen receptor positive status [ER+]: Secondary | ICD-10-CM

## 2013-01-13 DIAGNOSIS — C50119 Malignant neoplasm of central portion of unspecified female breast: Secondary | ICD-10-CM

## 2013-01-13 NOTE — Telephone Encounter (Signed)
appts was made and printed.the patient is aware that she will be called with her bone density appt.

## 2013-01-13 NOTE — Progress Notes (Signed)
ID: [name]   DOB: 09/16/1946  MR#: [id number]  NWG#:[id number]  PCP: Illene Regulus, MD GYN:  SU:  OTHER MD:   HISTORY OF PRESENT ILLNESS:T1 C. 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 She did not require any chest wall radiation. She was on Femara for a short while before changing to Arimidex.    INTERVAL HISTORY: She is doing well overall and tolerating the Arimidex well. She remains active, walking every day. She has gone from smoking 2 packs per day down to 5 cigarettes per day and consequently has been able to come off numerous inhalers and respiratory drugs. She is confident she will be able to quit completely soon.  She is due for her mammogram and for a repeat bone density and we will order those today.    REVIEW OF SYSTEMS: General: denies unexplained weight loss, fatigue, night sweats, fevers, chills HEENT: denies headaches, blurred vision, dizziness, loss of balance admits to sinus issues due to spring allergies Cardiac: denies chest pain, pressure or palpitations Lungs: denies wheezing, shortness of breath or productive cough Abd: denies vomiting, constipation, diarrhea, indigestion, blood in stool or urine, dysphagia admits to chronic nausea Extremities: denies numbness, tingling, swelling or pain admits to blocked arteries in legs that cause leg pain  The rest of the 14-point review of system was negative.    PAST MEDICAL HISTORY: Past Medical History  Diagnosis Date  . COPD (chronic obstructive pulmonary disease)   . GERD (gastroesophageal reflux disease)   . Hypertension   . Diverticulitis     required colcectomy  . Osteoporosis   . Hyperlipidemia   . Emphysema of lung   . Cancer 2010    invasive ductal breast cancer; on  tamoxifen  . Fibromyalgia 1995  . Hx of colonic polyps     Dr. Matthias Hughs -last study '11  . Peripheral vascular disease May '12 - CT angio    SMA chronic occlusion; stenosis of celiac trunk.  . Hypothyroidism   . Hearing loss of both ears     uses hearing aids  bilaterally  . Varicose veins of leg with swelling     varicose vein surgery - Dr. Guss Bunde    PAST SURGICAL HISTORY: Past Surgical History  Procedure Laterality Date  . Appendectomy    . Breast surgery      mastectomy with reconstructive surgery  . Wedge resection      right upper lobe of lung as part of VAT-nodule/benign  . Colonoscopy w/ polypectomy    . Colectomy  2002    sigmoid  . Abdominal hysterectomy  1973    metorrhagia  . Cardiac catheterization  '11    no obstructive coronary disease    PATHOLOGY:  FAMILY HISTORY Family History  Problem Relation Age of Onset  . Colon cancer Mother     colon  . COPD Mother     brown lung  . Hypertension Mother   . Diabetes Mother   . Coronary artery disease Father   . Cancer Sister     fallopian tube  . Hypertension Sister   . Coronary artery disease Brother   . Throat cancer Sister   . Melanoma Sister   . Hypertension Sister   . Pancreatic cancer Sister   .  Cancer Sister   . Heart attack Father   . Heart attack Brother     early 34's  . Sudden death Father   . Emphysema Mother     GYNECOLOGIC HISTORY:  SOCIAL HISTORY:    ADVANCED DIRECTIVES:  HEALTH MAINTENANCE: History  Substance Use Topics  . Smoking status: Former Smoker -- 0.75 packs/day    Types: Cigarettes    Quit date: 03/18/2012  . Smokeless tobacco: Never Used     Comment: smokes less   . Alcohol Use: 6.0 oz/week    10 Cans of beer per week     Colonoscopy:  PAP:  Bone density:  Lipid panel:  Allergies  Allergen Reactions  . Sulfonamide Derivatives Swelling    Current Outpatient Prescriptions  Medication Sig Dispense Refill  . albuterol (PROVENTIL HFA;VENTOLIN HFA)  108 (90 BASE) MCG/ACT inhaler Inhale 2 puffs into the lungs every 6 (six) hours as needed for wheezing. As a RESCUE medication  1 Inhaler  2  . albuterol-ipratropium (COMBIVENT) 18-103 MCG/ACT inhaler Inhale 2 puffs into the lungs every 6 (six) hours as needed for wheezing.  1 Inhaler  0  . amitriptyline (ELAVIL) 25 MG tablet Take 1 tablet (25 mg total) by mouth at bedtime.  60 tablet  6  . amitriptyline (ELAVIL) 25 MG tablet TAKE TWO TABLET BY MOUTH EVERY AT BEDTIME  60 tablet  5  . amLODipine (NORVASC) 10 MG tablet TAKE ONE TABLET BY MOUTH ONE TIME DAILY  30 tablet  5  . anastrozole (ARIMIDEX) 1 MG tablet TAKE ONE TABLET BY MOUTH ONE TIME DAILY  30 tablet  2  . aspirin 81 MG tablet Take 81 mg by mouth daily.        Marland Kitchen atorvastatin (LIPITOR) 40 MG tablet TAKE ONE TABLET BY MOUTH ONE TIME DAILY  30 tablet  5  . B Complex Vitamins (VITAMIN-B COMPLEX PO) Take by mouth daily.        Marland Kitchen buPROPion (WELLBUTRIN SR) 150 MG 12 hr tablet TAKE ONE TABLET BY MOUTH TWICE DAILY  60 tablet  4  . Calcium Carbonate-Vitamin D (CALCIUM + D PO) Take 600 mg by mouth daily.       . cilostazol (PLETAL) 100 MG tablet Take 1 tablet (100 mg total) by mouth 2 (two) times daily.  60 tablet  6  . diclofenac (VOLTAREN) 75 MG EC tablet TAKE ONE TABLET BY MOUTH TWICE A DAY FOR ARTHRITIS  60 tablet  5  . Fluticasone-Salmeterol (ADVAIR) 100-50 MCG/DOSE AEPB Inhale 1 puff into the lungs 2 (two) times daily. As a MAINTENANCE medication.  1 each  3  . levothyroxine (SYNTHROID, LEVOTHROID) 112 MCG tablet TAKE ONE TABLET BY MOUTH ONE TIME DAILY  30 tablet  5  . LORazepam (ATIVAN) 2 MG tablet Take 1 tablet (2 mg total) by mouth every 8 (eight) hours as needed.  30 tablet  5  . metoCLOPramide (REGLAN) 10 MG tablet TAKE 1 TABLET BY MOUTH FOUR TIMES DAILY THIRTY MINUTES BEFORE MEALSAND BEDTIME  120 tablet  2  . metoprolol tartrate (LOPRESSOR) 25 MG tablet Take 25 mg by mouth 2 (two) times daily.        Marland Kitchen omeprazole (PRILOSEC) 40 MG capsule  TAKE ONE CAPSULE BY MOUTH TWICE DAILY  60 capsule  6  . promethazine-codeine (PHENERGAN WITH CODEINE) 6.25-10 MG/5ML syrup Take 5 mLs by mouth every 4 (four) hours as needed for cough.  120 mL  0  . tiotropium (SPIRIVA HANDIHALER) 18 MCG inhalation capsule  Place 1 capsule (18 mcg total) into inhaler and inhale daily. As a MAINTENANCE medication  30 capsule  12   No current facility-administered medications for this visit.    OBJECTIVE:  Blood pressure 137/83, pulse 88, temperature 98.1 F (36.7 C), temperature source Oral, resp. rate 20, height 5\' 6"  (1.676 m), weight 146 lb 14.4 oz (66.633 kg).    ECOG: 0 Sclerae unicteric Oropharynx clear No cervical or supraclavicular adenopathy Lungs no rales or rhonchi Heart regular rate and rhythm Abd benign MSK no focal spinal tenderness, no peripheral edema Neuro: nonfocal Breasts: right breast s/p mastectomy with reconstruction; left breast without masses; no suspicious thickening noted to either side; no palpable nodules in either axillae. Patient examined in both sitting and lying position.    LAB RESULTS: Lab Results  Component Value Date   WBC 6.5 08/21/2012   NEUTROABS 4.0 08/21/2012   HGB 14.9 08/21/2012   HCT 43.2 08/21/2012   MCV 92.5 08/21/2012   PLT 313 08/21/2012      Chemistry      Component Value Date/Time   NA 140 08/21/2012 1501   NA 138 02/08/2012 1239   NA 134 12/08/2010 0858   K 3.6 08/21/2012 1501   K 4.8 02/08/2012 1239   K 5.2* 12/08/2010 0858   CL 108* 08/21/2012 1501   CL 102 02/08/2012 1239   CL 101 12/08/2010 0858   CO2 24 08/21/2012 1501   CO2 27 02/08/2012 1239   CO2 22 12/08/2010 0858   BUN 15.0 08/21/2012 1501   BUN 16 02/08/2012 1239   BUN 30* 12/08/2010 0858   CREATININE 1.0 08/21/2012 1501   CREATININE 0.83 02/08/2012 1239   CREATININE 0.92 06/04/2011 1615      Component Value Date/Time   CALCIUM 10.0 08/21/2012 1501   CALCIUM 9.9 02/08/2012 1239   CALCIUM 10.3 05/16/2011 1414   CALCIUM 9.9 12/08/2010 0858    ALKPHOS 82 08/21/2012 1501   ALKPHOS 80 02/08/2012 1239   ALKPHOS 97* 12/08/2010 0858   AST 24 08/21/2012 1501   AST 20 02/08/2012 1239   AST 41* 12/08/2010 0858   ALT 31 08/21/2012 1501   ALT 20 02/08/2012 1239   BILITOT 0.41 08/21/2012 1501   BILITOT 0.8 02/08/2012 1239   BILITOT 0.60 12/08/2010 0858       Lab Results  Component Value Date   LABCA2 29 08/07/2011    No components found with this basename: [id number]    No results found for this basename: INR,  in the last 168 hours  Urinalysis    Component Value Date/Time   COLORURINE YELLOW 06/12/2011 1344   APPEARANCEUR CLEAR 06/12/2011 1344   LABSPEC 1.004* 06/12/2011 1344   LABSPEC 1.030 03/08/2009 1016   PHURINE 6.0 06/12/2011 1344   GLUCOSEU NEGATIVE 06/12/2011 1344   HGBUR NEGATIVE 06/12/2011 1344   HGBUR negative 11/09/2010 0958   BILIRUBINUR NEGATIVE 06/12/2011 1344   KETONESUR NEGATIVE 06/12/2011 1344   PROTEINUR NEGATIVE 06/12/2011 1344   UROBILINOGEN 0.2 06/12/2011 1344   NITRITE NEGATIVE 06/12/2011 1344   LEUKOCYTESUR NEGATIVE 06/12/2011 1344    STUDIES: No results found.  ASSESSMENT: 67 y.o. T1 C. N0 ER/PR positive breast cancer status post 1. Mastectomy with myocutaneous flap 09/19/2009 2. Adjuvant chemo with q3 week 4 cycles of TC chemo 3. Femara, followed by Arimidex, started roughly Jan 2011 4. Currently on Arimidex.     PLAN: Continue Arimidex, order mammogram and bone density today.  Follow up in one year. She did meet with  Dr. [name] today.  All of her questions were answered to her satisfaction.    Kristain Filo  AOCNP, NP-C   01/13/2013

## 2013-01-20 ENCOUNTER — Other Ambulatory Visit: Payer: Self-pay

## 2013-01-20 MED ORDER — METOPROLOL TARTRATE 25 MG PO TABS: 25.0000 mg | ORAL_TABLET | Freq: Two times a day (BID) | ORAL | Status: DC

## 2013-01-22 ENCOUNTER — Other Ambulatory Visit: Payer: Self-pay

## 2013-01-22 ENCOUNTER — Other Ambulatory Visit: Payer: Self-pay | Admitting: *Deleted

## 2013-01-22 DIAGNOSIS — C50919 Malignant neoplasm of unspecified site of unspecified female breast: Secondary | ICD-10-CM

## 2013-01-22 MED ORDER — ANASTROZOLE 1 MG PO TABS: ORAL_TABLET | ORAL | Status: DC

## 2013-01-22 MED ORDER — LEVOTHYROXINE SODIUM 112 MCG PO TABS: 112.0000 ug | ORAL_TABLET | Freq: Every day | ORAL | Status: DC

## 2013-01-23 ENCOUNTER — Other Ambulatory Visit: Payer: Self-pay | Admitting: *Deleted

## 2013-01-29 ENCOUNTER — Encounter (INDEPENDENT_AMBULATORY_CARE_PROVIDER_SITE_OTHER): Payer: Self-pay | Admitting: General Surgery

## 2013-01-29 ENCOUNTER — Ambulatory Visit (INDEPENDENT_AMBULATORY_CARE_PROVIDER_SITE_OTHER): Payer: Medicare Other | Admitting: General Surgery

## 2013-01-29 VITALS — BP 112/72 | HR 58 | Temp 98.5°F | Resp 18 | Ht 66.0 in | Wt 150.6 lb

## 2013-01-29 DIAGNOSIS — R920 Mammographic microcalcification found on diagnostic imaging of breast: Secondary | ICD-10-CM | POA: Insufficient documentation

## 2013-01-29 NOTE — Patient Instructions (Signed)
Recent mammograms showed a small area of tightly clustered microcalcifications centrally in the left breast. The radiologists were unable to perform a stereotactic biopsy.  You'll be scheduled for a left partial mastectomy with needle localization, as described in the office today.     Lumpectomy, Breast Conserving Surgery A lumpectomy is breast surgery that removes only part of the breast. Another name used may be partial mastectomy. The amount removed varies. Make sure you understand how much of your breast will be removed. Reasons for a lumpectomy:  Any solid breast mass.  Grouped significant nodularity that may be confused with a solitary breast mass. Lumpectomy is the most common form of breast cancer surgery today. The surgeon removes the portion of your breast which contains the tumor (cancer). This is the lump. Some normal tissue around the lump is also removed to be sure that all the tumor has been removed.  If cancer cells are found in the margins where the breast tissue was removed, your surgeon will do more surgery to remove the remaining cancer tissue. This is called re-excision surgery. Radiation and/or chemotherapy treatments are often given following a lumpectomy to kill any cancer cells that could possibly remain.  REASONS YOU MAY NOT BE ABLE TO HAVE BREAST CONSERVING SURGERY:  The tumor is located in more than one place.  Your breast is small and the tumor is large so the breast would be disfigured.  The entire tumor removal is not successful with a lumpectomy.  You cannot commit to a full course of chemotherapy, radiation therapy or are pregnant and cannot have radiation.  You have previously had radiation to the breast to treat cancer. HOW A LUMPECTOMY IS PERFORMED If overnight nursing is not required following a biopsy, a lumpectomy can be performed as a same-day surgery. This can be done in a hospital, clinic, or surgical center. The anesthesia used will depend on  your surgeon. They will discuss this with you. A general anesthetic keeps you sleeping through the procedure. LET YOUR CAREGIVERS KNOW ABOUT THE FOLLOWING:  Allergies  Medications taken including herbs, eye drops, over the counter medications, and creams.  Use of steroids (by mouth or creams)  Previous problems with anesthetics or Novocaine.  Possibility of pregnancy, if this applies  History of blood clots (thrombophlebitis)  History of bleeding or blood problems.  Previous surgery  Other health problems BEFORE THE PROCEDURE You should be present one hour prior to your procedure unless directed otherwise.  AFTER THE PROCEDURE  After surgery, you will be taken to the recovery area where a nurse will watch and check your progress. Once you're awake, stable, and taking fluids well, barring other problems you will be allowed to go home.  Ice packs applied to your operative site may help with discomfort and keep the swelling down.  A small rubber drain may be placed in the breast for a couple of days to prevent a hematoma from developing in the breast.  A pressure dressing may be applied for 24 to 48 hours to prevent bleeding.  Keep the wound dry.  You may resume a normal diet and activities as directed. Avoid strenuous activities affecting the arm on the side of the biopsy site such as tennis, swimming, heavy lifting (more than 10 pounds) or pulling.  Bruising in the breast is normal following this procedure.  Wearing a bra - even to bed - may be more comfortable and also help keep the dressing on.  Change dressings as directed.  Only  take over-the-counter or prescription medicines for pain, discomfort, or fever as directed by your caregiver. Call for your results as instructed by your surgeon. Remember it is your responsibility to get the results of your lumpectomy if your surgeon asked you to follow-up. Do not assume everything is fine if you have not heard from your  caregiver. SEEK MEDICAL CARE IF:   There is increased bleeding (more than a small spot) from the wound.  You notice redness, swelling, or increasing pain in the wound.  Pus is coming from wound.  An unexplained oral temperature above 102 F (38.9 C) develops.  You notice a foul smell coming from the wound or dressing. SEEK IMMEDIATE MEDICAL CARE IF:   You develop a rash.  You have difficulty breathing.  You have any allergic problems. Document Released: 11/12/2006 Document Revised: 12/24/2011 Document Reviewed: 02/13/2007 [facility] Patient Information 2013 [place], [place].      Angelia Mould. Derrell Lolling, M.D., [facility] General and Minimally invasive Surgery Breast and Colorectal Surgery Office:   [phone] Pager:   [phone]

## 2013-01-29 NOTE — Progress Notes (Signed)
Patient ID: [name], female   DOB: July 03, 1946, 67 y.o.   MRN: [id number]  Chief Complaint  Patient presents with  . New Evaluation    eval Lt br mass    HPI [name] is a 67 y.o. female.  She is referred by Dr. Anne Hahn at [facility] for evaluation and surgical management of an area of microcalcifications in the left breast, centrally, slightly medially and slightly superiorly.  This patient is actually Dr. [name] patient. He is out of town this week and the patient did not want to wait at all. She is very nervous because she has a history of right breast cancer. On 12/28/2008 Dr Luisa Hart performed a right simple mastectomy. This was following 2 lumpectomies with positive margins. ER  positive, node-negative. Dr. Donnie Coffin gave her chemotherapy. She had an implant. That failed due to fluid collections. She ultimately had a latissimus flap and a new implant that has finally healed. She has never had a disease of the left and she has had a reduction mammoplasty on the left. Prior mammograms of the left breast had been category 1. Recent mammograms on 01/19/2013 show tightly clustered area of microcalcifications in the central left breast, slightly medially and slightly superiorly. Core biopsy was attempted stereotactically but they could not visualize it well and so she was referred for excision.  She is very pleasant, but very anxious and does not want to wait at all to do anything about this. I told her we would expedite her biopsy. She is now being followed by Dr. [name]. She is on a rib fracture.  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 Colon resection for diverticulitis. Lung surgery for granulomatous disease.  Family history is negative for breast cancer. 2 sisters had head and neck cancer ,  one had fallopian tube cancer ,  one had pancreatic cancer. Mother died of metastatic disease  of unknown primary.  HPI  Past Medical History  Diagnosis Date  . COPD (chronic obstructive pulmonary disease)   . GERD (gastroesophageal reflux disease)   . Hypertension   . Diverticulitis     required colcectomy  . Osteoporosis   . Hyperlipidemia   . Emphysema of lung   . Cancer 2010    invasive ductal breast cancer; on tamoxifen  . Fibromyalgia 1995  . Hx of colonic polyps     Dr. Matthias Hughs -last study '11  . Peripheral vascular disease May '12 - CT angio    SMA chronic occlusion; stenosis of celiac trunk.  . Hypothyroidism   . Hearing loss of both ears     uses hearing aids  bilaterally  . Varicose veins of leg with swelling     varicose vein surgery - Dr. Guss Bunde    Past Surgical History  Procedure Laterality Date  . Appendectomy    . Breast surgery      mastectomy with reconstructive surgery  . Wedge resection      right upper lobe of lung as part of VAT-nodule/benign  . Colonoscopy w/ polypectomy    . Colectomy  2002    sigmoid  . Abdominal hysterectomy  1973    metorrhagia  . Cardiac catheterization  '11    no obstructive coronary disease    Family History  Problem Relation Age of Onset  . Colon cancer Mother     colon  . COPD Mother     brown lung  . Hypertension Mother   .  Diabetes Mother   . Emphysema Mother   . Coronary artery disease Father   . Heart attack Father   . Sudden death Father   . Heart disease Father   . Cancer Sister     fallopian tube  . Hypertension Sister   . Coronary artery disease Brother   . Throat cancer Sister   . Melanoma Sister   . Hypertension Sister   . Pancreatic cancer Sister   . Cancer Sister   . Heart attack Brother     early 24's    Social History History  Substance Use Topics  . Smoking status: Former Smoker -- 0.75 packs/day    Types: Cigarettes    Quit date: 03/18/2012  . Smokeless tobacco: Never Used     Comment: smokes less   . Alcohol Use: 6.0 oz/week    10 Cans of beer per week     Allergies  Allergen Reactions  . Sulfonamide Derivatives Swelling    Current Outpatient Prescriptions  Medication Sig Dispense Refill  . albuterol (PROVENTIL HFA;VENTOLIN HFA) 108 (90 BASE) MCG/ACT inhaler Inhale 2 puffs into the lungs every 6 (six) hours as needed for wheezing. As a RESCUE medication  1 Inhaler  2  . amitriptyline (ELAVIL) 25 MG tablet TAKE TWO TABLET BY MOUTH EVERY AT BEDTIME  60 tablet  5  . amLODipine (NORVASC) 10 MG tablet TAKE ONE TABLET BY MOUTH ONE TIME DAILY  30 tablet  5  . anastrozole (ARIMIDEX) 1 MG tablet TAKE ONE TABLET BY MOUTH ONE TIME DAILY  30 tablet  11  . aspirin 81 MG tablet Take 81 mg by mouth daily.        Marland Kitchen atorvastatin (LIPITOR) 40 MG tablet TAKE ONE TABLET BY MOUTH ONE TIME DAILY  30 tablet  5  . B Complex Vitamins (VITAMIN-B COMPLEX PO) Take by mouth daily.        . Calcium Carbonate-Vitamin D (CALCIUM + D PO) Take 600 mg by mouth daily.       . Fluticasone-Salmeterol (ADVAIR) 100-50 MCG/DOSE AEPB Inhale 1 puff into the lungs 2 (two) times daily. As a MAINTENANCE medication.  1 each  3  . levothyroxine (SYNTHROID, LEVOTHROID) 112 MCG tablet Take 1 tablet (112 mcg total) by mouth daily.  30 tablet  5  . LORazepam (ATIVAN) 2 MG tablet Take 1 tablet (2 mg total) by mouth every 8 (eight) hours as needed.  30 tablet  5  . metoCLOPramide (REGLAN) 10 MG tablet TAKE 1 TABLET BY MOUTH FOUR TIMES DAILY THIRTY MINUTES BEFORE MEALSAND BEDTIME  120 tablet  2  . metoprolol tartrate (LOPRESSOR) 25 MG tablet Take 1 tablet (25 mg total) by mouth 2 (two) times daily.  60 tablet  2  . omeprazole (PRILOSEC) 40 MG capsule TAKE ONE CAPSULE BY MOUTH TWICE DAILY  60 capsule  6  . tiotropium (SPIRIVA HANDIHALER) 18 MCG inhalation capsule Place 1 capsule (18 mcg total) into inhaler and inhale daily. As a MAINTENANCE medication  30 capsule  12  . albuterol-ipratropium (COMBIVENT) 18-103 MCG/ACT inhaler Inhale 2 puffs into the lungs every 6 (six) hours as needed for  wheezing.  1 Inhaler  0   No current facility-administered medications for this visit.    Review of Systems Review of Systems  Constitutional: Negative for fever, chills and unexpected weight change.  HENT: Negative for hearing loss, congestion, sore throat, trouble swallowing and voice change.   Eyes: Negative for visual disturbance.  Respiratory: Positive for shortness of  breath. Negative for cough and wheezing.   Cardiovascular: Negative for chest pain, palpitations and leg swelling.  Gastrointestinal: Negative for nausea, vomiting, abdominal pain, diarrhea, constipation, blood in stool, abdominal distention and anal bleeding.  Genitourinary: Negative for hematuria, vaginal bleeding and difficulty urinating.  Musculoskeletal: Negative for arthralgias.  Skin: Negative for rash and wound.  Neurological: Negative for seizures, syncope and headaches.  Hematological: Negative for adenopathy. Does not bruise/bleed easily.  Psychiatric/Behavioral: Negative for confusion.    Blood pressure 112/72, pulse 58, temperature 98.5 F (36.9 C), temperature source Temporal, resp. rate 18, height 5\' 6"  (1.676 m), weight 150 lb 9.6 oz (68.312 kg).  Physical Exam Physical Exam  Constitutional: She is oriented to person, place, and time. She appears well-developed and well-nourished. No distress.  HENT:  Head: Normocephalic and atraumatic.  Nose: Nose normal.  Mouth/Throat: No oropharyngeal exudate.  Eyes: Conjunctivae and EOM are normal. Pupils are equal, round, and reactive to light. Left eye exhibits no discharge. No scleral icterus.  Neck: Neck supple. No JVD present. No tracheal deviation present. No thyromegaly present.  Cardiovascular: Normal rate, regular rhythm, normal heart sounds and intact distal pulses.   No murmur heard. Pulmonary/Chest: Effort normal. No respiratory distress. She has no wheezes. She has no rales. She exhibits no tenderness.    Multiple scars both breasts. Implant  on right. No palpable mass in either breast. No skin changes no skin nodules no axillary adenopathy.breath sounds are distant. No wheeze.  Abdominal: Soft. Bowel sounds are normal. She exhibits no distension and no mass. There is no tenderness. There is no rebound and no guarding.    Musculoskeletal: She exhibits no edema and no tenderness.  Lymphadenopathy:    She has no cervical adenopathy.  Neurological: She is alert and oriented to person, place, and time. She exhibits normal muscle tone. Coordination normal.  Skin: Skin is warm. No rash noted. She is not diaphoretic. No erythema. No pallor.  Psychiatric: She has a normal mood and affect. Her behavior is normal. Judgment and thought content normal.    Data Reviewed All of our old records. Recent imaging studies were reviewed. Cardiology notes reviewed. The cancer center notes reviewed.  Assessment    Tight cluster of somewhat suspicious microcalcifications, central left breast. Excision is indicated to exclude occult cancer  Past history of right breast cancer, status post 2 lumpectomies, right simple mastectomy, numerous procedures for reconstruction including latissimus flap with implant. History chemotherapy. Currently on arimidex.    No evidence of recurrent disease on the right  History a reduction mammoplasty on the left.  Mesenteric arterial insufficiency, apparently a radiographic finding but relatively asymptomatic without pain or weight loss  Coronary disease  Hypertension  Asthma and COPD  History lung surgery for granulomatous disease  Ongoing tobacco abuse  History: Resection for diverticulitis     Plan    At her request, I will go ahead and schedule her for a left partial mastectomy with needle localization early next week.  We will ask Dr. Marca Ancona if we can discontinue her Pletal 5 days preop  I discussed the indications, details, techniques, and numerous risks of the surgery with her. She  understands all these issues. All of her questions are answered. She agrees with this plan.       Angelia Mould. Derrell Lolling, M.D., [facility] General and Minimally invasive Surgery Breast and Colorectal Surgery Office:   [phone] Pager:   [phone]  01/29/2013, 11:45 AM

## 2013-02-02 ENCOUNTER — Encounter (INDEPENDENT_AMBULATORY_CARE_PROVIDER_SITE_OTHER): Payer: Self-pay

## 2013-02-04 ENCOUNTER — Encounter (INDEPENDENT_AMBULATORY_CARE_PROVIDER_SITE_OTHER): Payer: Self-pay

## 2013-02-16 ENCOUNTER — Encounter (HOSPITAL_BASED_OUTPATIENT_CLINIC_OR_DEPARTMENT_OTHER): Payer: Self-pay | Admitting: *Deleted

## 2013-02-16 NOTE — Progress Notes (Signed)
To come in for labs,ekg,cxr-had br ca rt with 2 lumpectomies, mastectomy and reconstruction-chemo.

## 2013-02-17 MED ORDER — CYCLOBENZAPRINE HCL 10 MG PO TABS
ORAL_TABLET | ORAL | Status: AC
  Filled 2013-02-17: qty 1

## 2013-02-17 MED ORDER — HYDROMORPHONE HCL PF 1 MG/ML IJ SOLN
INTRAMUSCULAR | Status: AC
  Filled 2013-02-17: qty 1

## 2013-02-18 ENCOUNTER — Encounter (HOSPITAL_BASED_OUTPATIENT_CLINIC_OR_DEPARTMENT_OTHER)
Admission: RE | Admit: 2013-02-18 | Discharge: 2013-02-18 | Disposition: A | Discharge status: Home / Self Care | Payer: Medicare Other | Source: Ambulatory Visit | Attending: General Surgery | Admitting: General Surgery

## 2013-02-18 ENCOUNTER — Ambulatory Visit
Admission: RE | Admit: 2013-02-18 | Discharge: 2013-02-18 | Disposition: A | Discharge status: Home / Self Care | Payer: Medicare Other | Source: Ambulatory Visit | Attending: General Surgery | Admitting: General Surgery

## 2013-02-18 LAB — CBC WITH DIFFERENTIAL/PLATELET
Basophils Absolute: 0.1 10*3/uL (ref 0.0–0.1)
Basophils Relative: 1 % (ref 0–1)
Eosinophils Absolute: 0.4 10*3/uL (ref 0.0–0.7)
Eosinophils Relative: 7 % — ABNORMAL HIGH (ref 0–5)
HCT: 43.6 % (ref 36.0–46.0)
Hemoglobin: 15.8 g/dL — ABNORMAL HIGH (ref 12.0–15.0)
Lymphocytes Relative: 33 % (ref 12–46)
Lymphs Abs: 1.9 10*3/uL (ref 0.7–4.0)
MCH: 31.3 pg (ref 26.0–34.0)
MCHC: 36.2 g/dL — ABNORMAL HIGH (ref 30.0–36.0)
MCV: 86.5 fL (ref 78.0–100.0)
Monocytes Absolute: 0.8 10*3/uL (ref 0.1–1.0)
Monocytes Relative: 13 % — ABNORMAL HIGH (ref 3–12)
Neutro Abs: 2.8 10*3/uL (ref 1.7–7.7)
Neutrophils Relative %: 47 % (ref 43–77)
Platelets: 319 10*3/uL (ref 150–400)
RBC: 5.04 MIL/uL (ref 3.87–5.11)
RDW: 14.1 % (ref 11.5–15.5)
WBC: 5.9 10*3/uL (ref 4.0–10.5)

## 2013-02-18 LAB — COMPREHENSIVE METABOLIC PANEL
ALT: 18 U/L (ref 0–35)
AST: 16 U/L (ref 0–37)
Albumin: 4 g/dL (ref 3.5–5.2)
Alkaline Phosphatase: 78 U/L (ref 39–117)
BUN: 17 mg/dL (ref 6–23)
CO2: 25 mEq/L (ref 19–32)
Calcium: 10 mg/dL (ref 8.4–10.5)
Chloride: 102 mEq/L (ref 96–112)
Creatinine, Ser: 0.79 mg/dL (ref 0.50–1.10)
GFR calc Af Amer: >90 mL/min (ref >90)
GFR calc non Af Amer: 84 mL/min — ABNORMAL LOW (ref >90)
Glucose, Bld: 86 mg/dL (ref 70–99)
Potassium: 4.6 mEq/L (ref 3.5–5.1)
Sodium: 137 mEq/L (ref 135–145)
Total Bilirubin: 0.5 mg/dL (ref 0.3–1.2)
Total Protein: 7.6 g/dL (ref 6.0–8.3)

## 2013-02-20 NOTE — H&P (Signed)
[name]   MRN:  [id number]   Description: 67 year old female  Provider: [name], MD  Department: [facility]      Diagnoses    Abnormal mammogram with microcalcification    -  Primary    793.81        Current Vitals - Last Recorded    BP Pulse Temp(Src) Resp Ht Wt    112/72 58 98.5 F (36.9 C) (Temporal) 18 5\' 6"  (1.676 m) 150 lb 9.6 oz (68.312 kg)    BMI-24.32 kg/m2                    History and Physical   [name], MD    Status: Signed                         HPI [name] is a 67 y.o. female.  She is referred by Dr. Anne Hahn at [facility] for evaluation and surgical management of an area of microcalcifications in the left breast, centrally, slightly medially and slightly superiorly.   This patient is actually Dr. Rosezena [name] patient. He is out of town this week and the patient did not want to wait at all. She is very nervous because she has a history of right breast cancer. On 12/28/2008 Dr Luisa Hart performed a right simple mastectomy. This was following 2 lumpectomies with positive margins. ER  positive, node-negative. Dr. Donnie Coffin gave her chemotherapy. She had an implant. That failed due to fluid collections. She ultimately had a latissimus flap and a new implant that has finally healed. She has never had a disease of the left and she has had a reduction mammoplasty on the left. Prior mammograms of the left breast had been category 1. Recent mammograms on 01/19/2013 show tightly clustered area of microcalcifications in the central left breast, slightly medially and slightly superiorly. Core biopsy was attempted stereotactically but they could not visualize it well and so she was referred for excision.   She is very pleasant, but very anxious and does not want to wait at all to do anything about this. I told her we would expedite her biopsy. She is now being followed by Dr. [name]. She is on a rib fracture.   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 Colon resection for diverticulitis. Lung surgery for granulomatous disease.   Family history is negative for breast cancer. 2 sisters had head and neck cancer ,  one had fallopian tube cancer ,  one had pancreatic cancer. Mother died of metastatic disease of unknown primary.        Past Medical History   Diagnosis  Date   .  COPD (chronic obstructive pulmonary disease)     .  GERD (gastroesophageal reflux disease)     .  Hypertension     .  Diverticulitis         required colcectomy   .  Osteoporosis     .  Hyperlipidemia     .  Emphysema of lung     .  Cancer  2010       invasive ductal breast cancer; on tamoxifen   .  Fibromyalgia  1995   .  Hx of colonic polyps         Dr. Matthias Hughs -last study '11   .  Peripheral vascular disease  May '12 - CT  angio       SMA chronic occlusion; stenosis of celiac trunk.   .  Hypothyroidism     .  Hearing loss of both ears         uses hearing aids  bilaterally   .  Varicose veins of leg with swelling         varicose vein surgery - Dr. Guss Bunde         Past Surgical History   Procedure  Laterality  Date   .  Appendectomy       .  Breast surgery           mastectomy with reconstructive surgery   .  Wedge resection           right upper lobe of lung as part of VAT-nodule/benign   .  Colonoscopy w/ polypectomy       .  Colectomy    2002       sigmoid   .  Abdominal hysterectomy    1973       metorrhagia   .  Cardiac catheterization    '11       no obstructive coronary disease         Family History   Problem  Relation  Age of Onset   .  Colon cancer  Mother         colon   .  COPD  Mother         brown lung   .  Hypertension  Mother     .  Diabetes  Mother     .  Emphysema  Mother     .  Coronary artery disease  Father     .  Heart attack  Father     .  Sudden death  Father     .  Heart disease   Father     .  Cancer  Sister         fallopian tube   .  Hypertension  Sister     .  Coronary artery disease  Brother     .  Throat cancer  Sister     .  Melanoma  Sister     .  Hypertension  Sister     .  Pancreatic cancer  Sister     .  Cancer  Sister     .  Heart attack  Brother         early 86's        Social History History   Substance Use Topics   .  Smoking status:  Former Smoker -- 0.75 packs/day       Types:  Cigarettes       Quit date:  03/18/2012   .  Smokeless tobacco:  Never Used         Comment: smokes less    .  Alcohol Use:  6.0 oz/week       10 Cans of beer per week         Allergies   Allergen  Reactions   .  Sulfonamide Derivatives  Swelling         Current Outpatient Prescriptions   Medication  Sig  Dispense  Refill   .  albuterol (PROVENTIL HFA;VENTOLIN HFA) 108 (90 BASE) MCG/ACT inhaler  Inhale 2 puffs into the lungs every 6 (six) hours as needed for wheezing. As a RESCUE medication   1 Inhaler   2   .  amitriptyline (ELAVIL) 25 MG tablet  TAKE TWO TABLET BY MOUTH EVERY AT BEDTIME   60 tablet   5   .  amLODipine (NORVASC) 10 MG tablet  TAKE ONE TABLET BY MOUTH ONE TIME DAILY   30 tablet   5   .  anastrozole (ARIMIDEX) 1 MG tablet  TAKE ONE TABLET BY MOUTH ONE TIME DAILY   30 tablet   11   .  aspirin 81 MG tablet  Take 81 mg by mouth daily.           Marland Kitchen  atorvastatin (LIPITOR) 40 MG tablet  TAKE ONE TABLET BY MOUTH ONE TIME DAILY   30 tablet   5   .  B Complex Vitamins (VITAMIN-B COMPLEX PO)  Take by mouth daily.           .  Calcium Carbonate-Vitamin D (CALCIUM + D PO)  Take 600 mg by mouth daily.          .  Fluticasone-Salmeterol (ADVAIR) 100-50 MCG/DOSE AEPB  Inhale 1 puff into the lungs 2 (two) times daily. As a MAINTENANCE medication.   1 each   3   .  levothyroxine (SYNTHROID, LEVOTHROID) 112 MCG tablet  Take 1 tablet (112 mcg total) by mouth daily.   30 tablet   5   .  LORazepam (ATIVAN) 2 MG tablet  Take 1 tablet (2 mg total) by mouth  every 8 (eight) hours as needed.   30 tablet   5   .  metoCLOPramide (REGLAN) 10 MG tablet  TAKE 1 TABLET BY MOUTH FOUR TIMES DAILY THIRTY MINUTES BEFORE MEALSAND BEDTIME   120 tablet   2   .  metoprolol tartrate (LOPRESSOR) 25 MG tablet  Take 1 tablet (25 mg total) by mouth 2 (two) times daily.   60 tablet   2   .  omeprazole (PRILOSEC) 40 MG capsule  TAKE ONE CAPSULE BY MOUTH TWICE DAILY   60 capsule   6   .  tiotropium (SPIRIVA HANDIHALER) 18 MCG inhalation capsule  Place 1 capsule (18 mcg total) into inhaler and inhale daily. As a MAINTENANCE medication   30 capsule   12   .  albuterol-ipratropium (COMBIVENT) 18-103 MCG/ACT inhaler  Inhale 2 puffs into the lungs every 6 (six) hours as needed for wheezing.   1 Inhaler   0       No current facility-administered medications for this visit.        Review of Systems   Constitutional: Negative for fever, chills and unexpected weight change.  HENT: Negative for hearing loss, congestion, sore throat, trouble swallowing and voice change.   Eyes: Negative for visual disturbance.  Respiratory: Positive for shortness of breath. Negative for cough and wheezing.   Cardiovascular: Negative for chest pain, palpitations and leg swelling.  Gastrointestinal: Negative for nausea, vomiting, abdominal pain, diarrhea, constipation, blood in stool, abdominal distention and anal bleeding.  Genitourinary: Negative for hematuria, vaginal bleeding and difficulty urinating.  Musculoskeletal: Negative for arthralgias.  Skin: Negative for rash and wound.  Neurological: Negative for seizures, syncope and headaches.  Hematological: Negative for adenopathy. Does not bruise/bleed easily.  Psychiatric/Behavioral: Negative for confusion.      Blood pressure 112/72, pulse 58, temperature 98.5 F (36.9 C), temperature source Temporal, resp. rate 18, height 5\' 6"  (1.676 m), weight 150 lb 9.6 oz (68.312 kg).   Physical Exam  Constitutional: She is oriented to  person, place, and time. She appears well-developed and well-nourished. No  distress.  HENT:   Head: Normocephalic and atraumatic.   Nose: Nose normal.   Mouth/Throat: No oropharyngeal exudate.  Eyes: Conjunctivae and EOM are normal. Pupils are equal, round, and reactive to light. Left eye exhibits no discharge. No scleral icterus.  Neck: Neck supple. No JVD present. No tracheal deviation present. No thyromegaly present.  Cardiovascular: Normal rate, regular rhythm, normal heart sounds and intact distal pulses.    No murmur heard. Pulmonary/Chest: Effort normal. No respiratory distress. She has no wheezes. She has no rales. She exhibits no tenderness.   Multiple scars both breasts. Implant on right. No palpable mass in either breast. No skin changes no skin nodules no axillary adenopathy.breath sounds are distant. No wheeze.   Abdominal: Soft. Bowel sounds are normal. She exhibits no distension and no mass. There is no tenderness. There is no rebound and no guarding.  Musculoskeletal: She exhibits no edema and no tenderness.  Lymphadenopathy:    She has no cervical adenopathy.  Neurological: She is alert and oriented to person, place, and time. She exhibits normal muscle tone. Coordination normal.  Skin: Skin is warm. No rash noted. She is not diaphoretic. No erythema. No pallor.  Psychiatric: She has a normal mood and affect. Her behavior is normal. Judgment and thought content normal.      Data Reviewed All of our old records. Recent imaging studies were reviewed. Cardiology notes reviewed. The cancer center notes reviewed.   Assessment    Tight cluster of somewhat suspicious microcalcifications, central left breast. Excision is indicated to exclude occult cancer   Past history of right breast cancer, status post 2 lumpectomies, right simple mastectomy, numerous procedures for reconstruction including latissimus flap with implant. History chemotherapy. Currently on arimidex.    No  evidence of recurrent disease on the right   History a reduction mammoplasty on the left.   Mesenteric arterial insufficiency, apparently a radiographic finding but relatively asymptomatic without pain or weight loss   Coronary disease   Hypertension   Asthma and COPD   History lung surgery for granulomatous disease   Ongoing tobacco abuse   History: Resection for diverticulitis      Plan    At her request, I will go ahead and schedule her for a left partial mastectomy with needle localization early next week.   We will ask Dr. Marca Ancona if we can discontinue her Pletal 5 days preop   I discussed the indications, details, techniques, and numerous risks of the surgery with her. She understands all these issues. All of her questions are answered. She agrees with this plan.          Angelia Mould. Derrell Lolling, M.D., [facility] General and Minimally invasive Surgery Breast and Colorectal Surgery Office:   [phone] Pager:   [phone]

## 2013-02-22 ENCOUNTER — Other Ambulatory Visit: Payer: Self-pay | Admitting: Oncology

## 2013-02-23 ENCOUNTER — Other Ambulatory Visit: Payer: Self-pay | Admitting: Oncology

## 2013-02-23 ENCOUNTER — Ambulatory Visit (HOSPITAL_BASED_OUTPATIENT_CLINIC_OR_DEPARTMENT_OTHER): Payer: Medicare Other | Admitting: Anesthesiology

## 2013-02-23 ENCOUNTER — Encounter (HOSPITAL_BASED_OUTPATIENT_CLINIC_OR_DEPARTMENT_OTHER): Payer: Self-pay | Admitting: Anesthesiology

## 2013-02-23 ENCOUNTER — Encounter (HOSPITAL_BASED_OUTPATIENT_CLINIC_OR_DEPARTMENT_OTHER)
Admission: RE | Disposition: A | Discharge status: Home / Self Care | Payer: Self-pay | Source: Ambulatory Visit | Attending: General Surgery

## 2013-02-23 ENCOUNTER — Ambulatory Visit (HOSPITAL_BASED_OUTPATIENT_CLINIC_OR_DEPARTMENT_OTHER)
Admission: RE | Admit: 2013-02-23 | Discharge: 2013-02-23 | Disposition: A | Discharge status: Home / Self Care | Payer: Medicare Other | Source: Ambulatory Visit | Attending: General Surgery | Admitting: General Surgery

## 2013-02-23 DIAGNOSIS — Z8049 Family history of malignant neoplasm of other genital organs: Secondary | ICD-10-CM | POA: Insufficient documentation

## 2013-02-23 DIAGNOSIS — Z808 Family history of malignant neoplasm of other organs or systems: Secondary | ICD-10-CM | POA: Insufficient documentation

## 2013-02-23 DIAGNOSIS — Z853 Personal history of malignant neoplasm of breast: Secondary | ICD-10-CM | POA: Insufficient documentation

## 2013-02-23 DIAGNOSIS — R920 Mammographic microcalcification found on diagnostic imaging of breast: Secondary | ICD-10-CM | POA: Diagnosis present

## 2013-02-23 DIAGNOSIS — K551 Chronic vascular disorders of intestine: Secondary | ICD-10-CM | POA: Insufficient documentation

## 2013-02-23 DIAGNOSIS — I774 Celiac artery compression syndrome: Secondary | ICD-10-CM | POA: Insufficient documentation

## 2013-02-23 DIAGNOSIS — H9190 Unspecified hearing loss, unspecified ear: Secondary | ICD-10-CM | POA: Insufficient documentation

## 2013-02-23 DIAGNOSIS — Z882 Allergy status to sulfonamides status: Secondary | ICD-10-CM | POA: Insufficient documentation

## 2013-02-23 DIAGNOSIS — IMO0001 Reserved for inherently not codable concepts without codable children: Secondary | ICD-10-CM | POA: Insufficient documentation

## 2013-02-23 DIAGNOSIS — I83893 Varicose veins of bilateral lower extremities with other complications: Secondary | ICD-10-CM | POA: Insufficient documentation

## 2013-02-23 DIAGNOSIS — Z7982 Long term (current) use of aspirin: Secondary | ICD-10-CM | POA: Insufficient documentation

## 2013-02-23 DIAGNOSIS — Z9010 Acquired absence of unspecified breast and nipple: Secondary | ICD-10-CM | POA: Insufficient documentation

## 2013-02-23 DIAGNOSIS — K219 Gastro-esophageal reflux disease without esophagitis: Secondary | ICD-10-CM | POA: Insufficient documentation

## 2013-02-23 DIAGNOSIS — Z87891 Personal history of nicotine dependence: Secondary | ICD-10-CM | POA: Insufficient documentation

## 2013-02-23 DIAGNOSIS — N6029 Fibroadenosis of unspecified breast: Secondary | ICD-10-CM | POA: Insufficient documentation

## 2013-02-23 DIAGNOSIS — E785 Hyperlipidemia, unspecified: Secondary | ICD-10-CM | POA: Insufficient documentation

## 2013-02-23 DIAGNOSIS — Z809 Family history of malignant neoplasm, unspecified: Secondary | ICD-10-CM | POA: Insufficient documentation

## 2013-02-23 DIAGNOSIS — J438 Other emphysema: Secondary | ICD-10-CM | POA: Insufficient documentation

## 2013-02-23 DIAGNOSIS — Z79899 Other long term (current) drug therapy: Secondary | ICD-10-CM | POA: Insufficient documentation

## 2013-02-23 DIAGNOSIS — Z9221 Personal history of antineoplastic chemotherapy: Secondary | ICD-10-CM | POA: Insufficient documentation

## 2013-02-23 DIAGNOSIS — I10 Essential (primary) hypertension: Secondary | ICD-10-CM | POA: Insufficient documentation

## 2013-02-23 DIAGNOSIS — N6019 Diffuse cystic mastopathy of unspecified breast: Secondary | ICD-10-CM | POA: Insufficient documentation

## 2013-02-23 HISTORY — DX: Presence of spectacles and contact lenses: Z97.3

## 2013-02-23 HISTORY — PX: PARTIAL MASTECTOMY WITH NEEDLE LOCALIZATION: SHX6008

## 2013-02-23 SURGERY — PARTIAL MASTECTOMY WITH NEEDLE LOCALIZATION
Anesthesia: General | Site: Breast | Laterality: Left | Wound class: Clean

## 2013-02-23 MED ORDER — SODIUM CHLORIDE 0.9 % IJ SOLN: 3.0000 mL | INTRAMUSCULAR | Status: DC | PRN

## 2013-02-23 MED ORDER — ONDANSETRON HCL 4 MG/2ML IJ SOLN: 4.0000 mg | Freq: Four times a day (QID) | INTRAMUSCULAR | Status: DC | PRN

## 2013-02-23 MED ORDER — SODIUM CHLORIDE 0.9 % IJ SOLN: 3.0000 mL | Freq: Two times a day (BID) | INTRAMUSCULAR | Status: DC

## 2013-02-23 MED ORDER — BUPIVACAINE-EPINEPHRINE 0.5% -1:200000 IJ SOLN
INTRAMUSCULAR | Status: DC | PRN
  Administered 2013-02-23: 10 mL

## 2013-02-23 MED ORDER — ACETAMINOPHEN 325 MG PO TABS: 650.0000 mg | ORAL_TABLET | ORAL | Status: DC | PRN

## 2013-02-23 MED ORDER — MIDAZOLAM HCL 5 MG/5ML IJ SOLN
INTRAMUSCULAR | Status: DC | PRN
  Administered 2013-02-23: 2 mg via INTRAVENOUS

## 2013-02-23 MED ORDER — CHLORHEXIDINE GLUCONATE 4 % EX LIQD: 1.0000 "application " | Freq: Once | CUTANEOUS | Status: DC

## 2013-02-23 MED ORDER — ACETAMINOPHEN 650 MG RE SUPP: 650.0000 mg | RECTAL | Status: DC | PRN

## 2013-02-23 MED ORDER — SODIUM CHLORIDE 0.9 % IV SOLN: INTRAVENOUS | Status: DC

## 2013-02-23 MED ORDER — FENTANYL CITRATE 0.05 MG/ML IJ SOLN
INTRAMUSCULAR | Status: DC | PRN
  Administered 2013-02-23 (×2): 50 ug via INTRAVENOUS

## 2013-02-23 MED ORDER — ONDANSETRON HCL 4 MG/2ML IJ SOLN
INTRAMUSCULAR | Status: DC | PRN
  Administered 2013-02-23: 4 mg via INTRAVENOUS

## 2013-02-23 MED ORDER — METOCLOPRAMIDE HCL 5 MG/ML IJ SOLN: 10.0000 mg | Freq: Once | INTRAMUSCULAR | Status: DC | PRN

## 2013-02-23 MED ORDER — MORPHINE SULFATE 2 MG/ML IJ SOLN: 2.0000 mg | INTRAMUSCULAR | Status: DC | PRN

## 2013-02-23 MED ORDER — HYDROCODONE-ACETAMINOPHEN 5-325 MG PO TABS: 1.0000 | ORAL_TABLET | ORAL | Status: DC | PRN

## 2013-02-23 MED ORDER — LACTATED RINGERS IV SOLN
INTRAVENOUS | Status: DC
  Administered 2013-02-23 (×2): via INTRAVENOUS

## 2013-02-23 MED ORDER — LIDOCAINE HCL (CARDIAC) 20 MG/ML IV SOLN
INTRAVENOUS | Status: DC | PRN
  Administered 2013-02-23: 100 mg via INTRAVENOUS

## 2013-02-23 MED ORDER — OXYCODONE HCL 5 MG PO TABS: 5.0000 mg | ORAL_TABLET | Freq: Once | ORAL | Status: DC | PRN

## 2013-02-23 MED ORDER — OXYCODONE HCL 5 MG/5ML PO SOLN: 5.0000 mg | Freq: Once | ORAL | Status: DC | PRN

## 2013-02-23 MED ORDER — CEFAZOLIN SODIUM-DEXTROSE 2-3 GM-% IV SOLR: 2.0000 g | INTRAVENOUS | Status: DC

## 2013-02-23 MED ORDER — DEXAMETHASONE SODIUM PHOSPHATE 4 MG/ML IJ SOLN
INTRAMUSCULAR | Status: DC | PRN
  Administered 2013-02-23: 8 mg via INTRAVENOUS

## 2013-02-23 MED ORDER — HYDROMORPHONE HCL PF 1 MG/ML IJ SOLN: 0.2500 mg | INTRAMUSCULAR | Status: DC | PRN

## 2013-02-23 MED ORDER — OXYCODONE HCL 5 MG PO TABS: 5.0000 mg | ORAL_TABLET | ORAL | Status: DC | PRN

## 2013-02-23 MED ORDER — SODIUM CHLORIDE 0.9 % IV SOLN: 250.0000 mL | INTRAVENOUS | Status: DC | PRN

## 2013-02-23 SURGICAL SUPPLY — 60 items
ADH SKN CLS APL DERMABOND .7 (GAUZE/BANDAGES/DRESSINGS) ×2
APL SKNCLS STERI-STRIP NONHPOA (GAUZE/BANDAGES/DRESSINGS)
APPLIER CLIP 9.375 MED OPEN (MISCELLANEOUS)
APR CLP MED 9.3 20 MLT OPN (MISCELLANEOUS)
BANDAGE ELASTIC 6 VELCRO ST LF (GAUZE/BANDAGES/DRESSINGS) IMPLANT
BENZOIN TINCTURE PRP APPL 2/3 (GAUZE/BANDAGES/DRESSINGS) IMPLANT
BINDER BREAST LRG (GAUZE/BANDAGES/DRESSINGS) ×1 IMPLANT
BLADE SURG 15 STRL LF DISP TIS (BLADE) ×2 IMPLANT
BLADE SURG 15 STRL SS (BLADE) ×4
CANISTER SUCTION 1200CC (MISCELLANEOUS) ×2 IMPLANT
CHLORAPREP W/TINT 26ML (MISCELLANEOUS) ×2 IMPLANT
CLIP APPLIE 9.375 MED OPEN (MISCELLANEOUS) IMPLANT
CLOTH BEACON ORANGE TIMEOUT ST (SAFETY) ×2 IMPLANT
COVER MAYO STAND STRL (DRAPES) ×2 IMPLANT
COVER TABLE BACK 60X90 (DRAPES) ×2 IMPLANT
DECANTER SPIKE VIAL GLASS SM (MISCELLANEOUS) IMPLANT
DERMABOND ADVANCED (GAUZE/BANDAGES/DRESSINGS) ×2
DERMABOND ADVANCED .7 DNX12 (GAUZE/BANDAGES/DRESSINGS) IMPLANT
DEVICE DUBIN W/COMP PLATE 8390 (MISCELLANEOUS) ×1 IMPLANT
DRAPE LAPAROSCOPIC ABDOMINAL (DRAPES) IMPLANT
DRAPE LAPAROTOMY TRNSV 102X78 (DRAPE) IMPLANT
DRAPE PED LAPAROTOMY (DRAPES) ×2 IMPLANT
DRAPE UTILITY XL STRL (DRAPES) ×2 IMPLANT
DRSG PAD ABDOMINAL 8X10 ST (GAUZE/BANDAGES/DRESSINGS) ×1 IMPLANT
ELECT REM PT RETURN 9FT ADLT (ELECTROSURGICAL) ×2
ELECTRODE REM PT RTRN 9FT ADLT (ELECTROSURGICAL) ×1 IMPLANT
GAUZE SPONGE 4X4 12PLY STRL LF (GAUZE/BANDAGES/DRESSINGS) IMPLANT
GAUZE SPONGE 4X4 16PLY XRAY LF (GAUZE/BANDAGES/DRESSINGS) IMPLANT
GLOVE BIO SURGEON STRL SZ7 (GLOVE) ×1 IMPLANT
GLOVE BIOGEL PI IND STRL 7.0 (GLOVE) IMPLANT
GLOVE BIOGEL PI INDICATOR 7.0 (GLOVE) ×1
GLOVE EUDERMIC 7 POWDERFREE (GLOVE) ×2 IMPLANT
GLOVE EXAM NITRILE MD LF STRL (GLOVE) ×1 IMPLANT
GOWN PREVENTION PLUS XLARGE (GOWN DISPOSABLE) ×2 IMPLANT
GOWN PREVENTION PLUS XXLARGE (GOWN DISPOSABLE) ×2 IMPLANT
KIT MARKER MARGIN INK (KITS) IMPLANT
NDL HYPO 25X1 1.5 SAFETY (NEEDLE) ×1 IMPLANT
NEEDLE HYPO 22GX1.5 SAFETY (NEEDLE) IMPLANT
NEEDLE HYPO 25X1 1.5 SAFETY (NEEDLE) ×2 IMPLANT
NS IRRIG 1000ML POUR BTL (IV SOLUTION) ×2 IMPLANT
PACK BASIN DAY SURGERY FS (CUSTOM PROCEDURE TRAY) ×2 IMPLANT
PENCIL BUTTON HOLSTER BLD 10FT (ELECTRODE) ×2 IMPLANT
SLEEVE SCD COMPRESS KNEE MED (MISCELLANEOUS) ×1 IMPLANT
SLEEVE SURGEON STRL (DRAPES) ×1 IMPLANT
SPONGE LAP 4X18 X RAY DECT (DISPOSABLE) ×2 IMPLANT
STRIP CLOSURE SKIN 1/2X4 (GAUZE/BANDAGES/DRESSINGS) IMPLANT
SUT ETHILON 4 0 PS 2 18 (SUTURE) IMPLANT
SUT MNCRL AB 4-0 PS2 18 (SUTURE) ×2 IMPLANT
SUT SILK 2 0 SH (SUTURE) ×2 IMPLANT
SUT VIC AB 2-0 SH 27 (SUTURE)
SUT VIC AB 2-0 SH 27XBRD (SUTURE) IMPLANT
SUT VIC AB 4-0 P-3 18XBRD (SUTURE) IMPLANT
SUT VIC AB 4-0 P3 18 (SUTURE)
SUT VICRYL 3-0 CR8 SH (SUTURE) ×2 IMPLANT
SYR BULB 3OZ (MISCELLANEOUS) IMPLANT
SYR CONTROL 10ML LL (SYRINGE) ×2 IMPLANT
TAPE HYPAFIX 4 X10 (GAUZE/BANDAGES/DRESSINGS) IMPLANT
TOWEL OR NON WOVEN STRL DISP B (DISPOSABLE) ×2 IMPLANT
TUBE CONNECTING 20X1/4 (TUBING) ×2 IMPLANT
YANKAUER SUCT BULB TIP NO VENT (SUCTIONS) ×2 IMPLANT

## 2013-02-23 NOTE — Op Note (Addendum)
Patient Name:           [name]   Date of Surgery:        02/23/2013  Pre op Diagnosis:      Abnormal mammogram left breast, central, suspicious microcalcifications  Post op Diagnosis:    Same  Procedure:                 Left partial mastectomy with needle localization and margin assessment  Surgeon:                     Angelia Mould. Derrell Lolling, M.D., FACS  Assistant:                      None  Operative Indications:   [name] is a 67 y.o. female. She was referred by [name] at [facility] for evaluation and surgical management of an area of microcalcifications in the left breast, centrally, slightly medially and slightly superiorly.  This patient is actually [name] patient. He is out of town this week and the patient did not want to wait at all. She is very nervous because she has a history of right breast cancer. On 12/28/2008 [name] performed a right simple mastectomy. This was following 2 lumpectomies with positive margins. ER positive, node-negative. Christy. Donnie Coffin gave her chemotherapy. She had an implant. That failed due to fluid collections. She ultimately had a latissimus flap and a new implant that has finally healed. She has never had a disease of the left and she has had a reduction mammoplasty on the left. Prior mammograms of the left breast had been category 1. Recent mammograms on 01/19/2013 show tightly clustered area of microcalcifications in the central left breast, slightly medially and slightly superiorly. Core biopsy was attempted stereotactically but they could not visualize it well and so she was referred for excision.  She is very pleasant, but very anxious and does not want to wait at all to do anything about this. I told her we would expedite her biopsy. She is now being followed by [name]. She is on arimidex.  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  Colon resection for diverticulitis. Lung surgery for granulomatous disease.  Family history is negative for breast cancer. 2 sisters had head and neck cancer , one had fallopian tube cancer , one had pancreatic cancer. Mother died of metastatic disease of unknown primary.    Operative Findings:       The localizing wire entered the left breast at about the 11:30 position above the areolar margin was directed into the deep central left breast. The breast reduction mammoplasty scars were well healed. The specimen mammogram showed that the area in question was completely removed.  Procedure in Detail:          Following the wire localization at [facility], the patient was brought to the operating room at [facility] center where general anesthesia was induced. The left breast was prepped and draped in a sterile fashion. Intravenous antibiotics were given. Surgical time out was performed. The mammogram films were reviewed. 0.5% Marcaine with epinephrine was used as a local infiltration anesthetic. I chose to make a transverse, curvilinear, somewhat circumareolar incision about 2 cm above the areolar margin. Dissection was carried down into the breast tissue, around the localizing wire. The specimen was removed and marked superiorly, laterally, and deep to  orient the pathologist. The specimen mammogram looked good as described above. Specimen was marked and sent to the lab. Hemostasis was excellent and achieved with electrocautery. The wound was irrigated with saline. The deeper breast tissues were closed with interrupted sutures of 3 Vicryl and the skin closed with a running subcuticular suture of 4-0 Monocryl and Dermabond. The patient tolerated the procedure well was taken to recovery room in stable condition. EBL 10 cc. Counts correct. Complications none.     Angelia Mould. Derrell Lolling, M.D., FACS General and Minimally Invasive Surgery Breast and Colorectal Surgery  02/23/2013 11:07 AM

## 2013-02-23 NOTE — Interval H&P Note (Signed)
History and Physical Interval Note:  02/23/2013 10:06 AM  [name]  has presented today for surgery, with the diagnosis of left breast microcalcification  The goals and the  various methods of treatment have been discussed with the patient and family. After consideration of risks, benefits and other options for treatment, the patient has consented to  Procedure(s):  LEFT PARTIAL MASTECTOMY WITH NEEDLE LOCALIZATION (Left) as a surgical intervention .  The patient's history has been reviewed, patient examined today, no change in status, stable for surgery.  I have reviewed the patient's chart and labs.  Questions were answered to the patient's satisfaction.     [name]

## 2013-02-23 NOTE — Anesthesia Preprocedure Evaluation (Signed)
Anesthesia Evaluation  Patient identified by MRN, date of birth, ID band Patient awake    Reviewed: Allergy & Precautions, H&P , NPO status , Patient's Chart, lab work & pertinent test results, reviewed documented beta blocker date and time   Airway Mallampati: II TM Distance: >3 FB Neck ROM: full    Dental   Pulmonary sleep apnea , COPD breath sounds clear to auscultation        Cardiovascular hypertension, On Medications and On Home Beta Blockers + CAD and + Peripheral Vascular Disease negative cardio ROS  Rhythm:regular     Neuro/Psych  Neuromuscular disease negative psych ROS   GI/Hepatic Neg liver ROS, GERD-  Medicated and Controlled,  Endo/Other  Hypothyroidism   Renal/GU negative Renal ROS  negative genitourinary   Musculoskeletal   Abdominal   Peds  Hematology negative hematology ROS (+)   Anesthesia Other Findings See surgeon's H&P   Reproductive/Obstetrics negative OB ROS                           Anesthesia Physical Anesthesia Plan  ASA: III  Anesthesia Plan: General   Post-op Pain Management:    Induction: Intravenous  Airway Management Planned: LMA  Additional Equipment:   Intra-op Plan:   Post-operative Plan:   Informed Consent: I have reviewed the patients History and Physical, chart, labs and discussed the procedure including the risks, benefits and alternatives for the proposed anesthesia with the patient or authorized representative who has indicated his/her understanding and acceptance.   Dental Advisory Given  Plan Discussed with: CRNA and Surgeon  Anesthesia Plan Comments:         Anesthesia Quick Evaluation

## 2013-02-23 NOTE — Anesthesia Procedure Notes (Signed)
Procedure Name: LMA Insertion Date/Time: 02/23/2013 10:27 AM Performed by: Zenia Resides D Pre-anesthesia Checklist: Patient identified, Emergency Drugs available, Suction available and Patient being monitored Patient Re-evaluated:Patient Re-evaluated prior to inductionOxygen Delivery Method: Circle System Utilized Preoxygenation: Pre-oxygenation with 100% oxygen Intubation Type: IV induction Ventilation: Mask ventilation without difficulty LMA: LMA inserted LMA Size: 4.0 Number of attempts: 1 Airway Equipment and Method: bite block Placement Confirmation: positive ETCO2 Tube secured with: Tape Dental Injury: Teeth and Oropharynx as per pre-operative assessment

## 2013-02-23 NOTE — Anesthesia Postprocedure Evaluation (Signed)
Anesthesia Post Note  Patient: [name]  Procedure(s) Performed: Procedure(s) (LRB):  LEFT PARTIAL MASTECTOMY WITH NEEDLE LOCALIZATION (Left)  Anesthesia type: General  Patient location: PACU  Post pain: Pain level controlled  Post assessment: Patient's Cardiovascular Status Stable  Last Vitals:  Filed Vitals:   02/23/13 1200  BP: 157/70  Pulse: 79  Temp:   Resp: 16    Post vital signs: Reviewed and stable  Level of consciousness: alert  Complications: No apparent anesthesia complications

## 2013-02-23 NOTE — Discharge Instructions (Signed)
[facility] °Office Phone Number [phone] ° °BREAST BIOPSY/ PARTIAL MASTECTOMY: POST OP INSTRUCTIONS ° °Always review your discharge instruction sheet given to you by the facility where your surgery was performed. ° °IF YOU HAVE DISABILITY OR FAMILY LEAVE FORMS, YOU MUST BRING THEM TO THE OFFICE FOR PROCESSING.  DO NOT GIVE THEM TO YOUR DOCTOR. ° °1. A prescription for pain medication may be given to you upon discharge.  Take your pain medication as prescribed, if needed.  If narcotic pain medicine is not needed, then you may take acetaminophen (Tylenol) or ibuprofen (Advil) as needed. °2. Take your usually prescribed medications unless otherwise directed °3. If you need a refill on your pain medication, please contact your pharmacy.  They will contact our office to request authorization.  Prescriptions will not be filled after 5pm or on week-ends. °4. You should eat very light the first 24 hours after surgery, such as soup, crackers, pudding, etc.  Resume your normal diet the day after surgery. °5. Most patients will experience some swelling and bruising in the breast.  Ice packs and a good support bra will help.  Swelling and bruising can take several days to resolve.  °6. It is common to experience some constipation if taking pain medication after surgery.  Increasing fluid intake and taking a stool softener will usually help or prevent this problem from occurring.  A mild laxative (Milk of Magnesia or Miralax) should be taken according to package directions if there are no bowel movements after 48 hours. °7. Unless discharge instructions indicate otherwise, you may remove your bandages 24-48 hours after surgery, and you may shower at that time.  You may have steri-strips (small skin tapes) in place directly over the incision.  These strips should be left on the skin for 7-10 days.  If your surgeon used skin glue on the incision, you may shower in 24 hours.  The glue will flake off over the  next 2-3 weeks.  Any sutures or staples will be removed at the office during your follow-up visit. °8. ACTIVITIES:  You may resume regular daily activities (gradually increasing) beginning the next day.  Wearing a good support bra or sports bra minimizes pain and swelling.  You may have sexual intercourse when it is comfortable. °a. You may drive when you no longer are taking prescription pain medication, you can comfortably wear a seatbelt, and you can safely maneuver your car and apply brakes. °b. RETURN TO WORK:  ______________________________________________________________________________________ °9. You should see your doctor in the office for a follow-up appointment approximately two weeks after your surgery.  Your doctor’s nurse will typically make your follow-up appointment when she calls you with your pathology report.  Expect your pathology report 2-3 business days after your surgery.  You may call to check if you do not hear from us after three days. °10. OTHER INSTRUCTIONS: _______________________________________________________________________________________________ _____________________________________________________________________________________________________________________________________ °_____________________________________________________________________________________________________________________________________ °_____________________________________________________________________________________________________________________________________ ° °WHEN TO CALL YOUR DOCTOR: °1. Fever over 101.0 °2. Nausea and/or vomiting. °3. Extreme swelling or bruising. °4. Continued bleeding from incision. °5. Increased pain, redness, or drainage from the incision. ° °The clinic staff is available to answer your questions during regular business hours.  Please don’t hesitate to call and ask to speak to one of the nurses for clinical concerns.  If you have a medical emergency, go to the nearest  emergency room or call 911.  A surgeon from [facility] is always on call at the hospital. ° °For further questions, please visit [email or web address]  ° ° °  Post Anesthesia Home Care Instructions ° °Activity: °Get plenty of rest for the remainder of the day. A responsible adult should stay with you for 24 hours following the procedure.  °For the next 24 hours, DO NOT: °-Drive a car °-Operate machinery °-Drink alcoholic beverages °-Take any medication unless instructed by your physician °-Make any legal decisions or sign important papers. ° °Meals: °Start with liquid foods such as gelatin or soup. Progress to regular foods as tolerated. Avoid greasy, spicy, heavy foods. If nausea and/or vomiting occur, drink only clear liquids until the nausea and/or vomiting subsides. Call your physician if vomiting continues. ° °Special Instructions/Symptoms: °Your throat may feel dry or sore from the anesthesia or the breathing tube placed in your throat during surgery. If this causes discomfort, gargle with warm salt water. The discomfort should disappear within 24 hours. ° ° °Call your surgeon if you experience:  ° °1.  Fever over 101.0. °2.  Inability to urinate. °3.  Nausea and/or vomiting. °4.  Extreme swelling or bruising at the surgical site. °5.  Continued bleeding from the incision. °6.  Increased pain, redness or drainage from the incision. °7.  Problems related to your pain medication. ° ° °

## 2013-02-23 NOTE — Transfer of Care (Signed)
Immediate Anesthesia Transfer of Care Note  Patient: [name]  Procedure(s) Performed: Procedure(s):  LEFT PARTIAL MASTECTOMY WITH NEEDLE LOCALIZATION (Left)  Patient Location: PACU  Anesthesia Type:General  Level of Consciousness: awake  Airway & Oxygen Therapy: Patient Spontanous Breathing and Patient connected to face mask oxygen  Post-op Assessment: Report given to PACU RN and Post -op Vital signs reviewed and stable  Post vital signs: Reviewed and stable  Complications: No apparent anesthesia complications

## 2013-02-24 ENCOUNTER — Telehealth (INDEPENDENT_AMBULATORY_CARE_PROVIDER_SITE_OTHER): Payer: Self-pay

## 2013-02-24 ENCOUNTER — Encounter (HOSPITAL_BASED_OUTPATIENT_CLINIC_OR_DEPARTMENT_OTHER): Payer: Self-pay | Admitting: General Surgery

## 2013-02-24 NOTE — Telephone Encounter (Signed)
I called the pt and let her know of her pathology report

## 2013-02-24 NOTE — Telephone Encounter (Signed)
Message copied by Ivory Broad on Tue Feb 24, 2013  3:05 PM ------      Message from: [name]      Created: Tue Feb 24, 2013  2:45 PM       Inform patient of Pathology report,. Good news. Benign calcifications. ------

## 2013-02-24 NOTE — Progress Notes (Signed)
Quick Note:  Inform patient of Pathology report,. Good news. Benign calcifications. ______

## 2013-02-27 ENCOUNTER — Encounter (INDEPENDENT_AMBULATORY_CARE_PROVIDER_SITE_OTHER): Payer: Self-pay

## 2013-03-12 ENCOUNTER — Encounter (INDEPENDENT_AMBULATORY_CARE_PROVIDER_SITE_OTHER): Payer: Self-pay | Admitting: General Surgery

## 2013-03-12 ENCOUNTER — Ambulatory Visit (INDEPENDENT_AMBULATORY_CARE_PROVIDER_SITE_OTHER): Payer: Medicare Other | Admitting: General Surgery

## 2013-03-12 VITALS — BP 120/82 | HR 48 | Temp 97.1°F | Resp 18 | Ht 66.0 in | Wt 152.5 lb

## 2013-03-12 DIAGNOSIS — R920 Mammographic microcalcification found on diagnostic imaging of breast: Secondary | ICD-10-CM

## 2013-03-12 NOTE — Progress Notes (Signed)
Patient ID: [name], female   DOB: Apr 08, 1946, 67 y.o.   MRN: [id number] History: This patient underwent left partial mastectomy with needle localization on 02/23/2013. Fortunately the pathology report showed benign breast tissue with microcalcifications. She has no complaints about her healing and feels fine and has no pain. She does have a history of right breast cancer, having undergone a right simple mastectomy by Dr. Luisa Hart in 2010. She's had plastic surgical reconstruction. She is now being followed by. Dr. [name].  Exam: Patient looks well.  Left breast shows curvilinear incision 12:00 healing normally. Tissue soft. No fluid collection. No tenderness  Assessment: Recovering uneventfully following a left partial mastectomy with needle localization for benign mammographic abnormality History right breast cancer with multiple surgeries as described above  Plan: Continue regular followup with Dr. [name], her medical oncologist Return to see [place] if further surgical problems arise.   Angelia Mould. Derrell Lolling, M.D., [facility] General and Minimally invasive Surgery Breast and Colorectal Surgery Office:   [phone] Pager:   [phone]

## 2013-03-12 NOTE — Patient Instructions (Signed)
Your left breast biopsy incision is healing uneventfully. No infection, fluid, or hematoma. You may resume normal activities without restriction.  Keep you regular appointments with your oncologist, Dr. [name].  Return to see Dr. Luisa Hart if further surgical problems arise.

## 2013-03-23 ENCOUNTER — Observation Stay (HOSPITAL_COMMUNITY): Payer: Medicare Other

## 2013-03-23 ENCOUNTER — Emergency Department (HOSPITAL_COMMUNITY): Payer: Medicare Other

## 2013-03-23 ENCOUNTER — Inpatient Hospital Stay (HOSPITAL_COMMUNITY)
Admission: EM | Admit: 2013-03-23 | Discharge: 2013-03-30 | DRG: 390 | Disposition: A | Discharge status: Home / Self Care | Payer: Medicare Other | Attending: Internal Medicine | Admitting: Internal Medicine

## 2013-03-23 ENCOUNTER — Encounter (HOSPITAL_COMMUNITY): Payer: Self-pay | Admitting: Emergency Medicine

## 2013-03-23 DIAGNOSIS — K5900 Constipation, unspecified: Secondary | ICD-10-CM | POA: Diagnosis present

## 2013-03-23 DIAGNOSIS — J449 Chronic obstructive pulmonary disease, unspecified: Secondary | ICD-10-CM

## 2013-03-23 DIAGNOSIS — D72829 Elevated white blood cell count, unspecified: Secondary | ICD-10-CM | POA: Diagnosis present

## 2013-03-23 DIAGNOSIS — Z853 Personal history of malignant neoplasm of breast: Secondary | ICD-10-CM

## 2013-03-23 DIAGNOSIS — K560 Paralytic ileus: Principal | ICD-10-CM | POA: Diagnosis present

## 2013-03-23 DIAGNOSIS — I739 Peripheral vascular disease, unspecified: Secondary | ICD-10-CM | POA: Diagnosis present

## 2013-03-23 DIAGNOSIS — I10 Essential (primary) hypertension: Secondary | ICD-10-CM | POA: Diagnosis present

## 2013-03-23 DIAGNOSIS — I8390 Asymptomatic varicose veins of unspecified lower extremity: Secondary | ICD-10-CM | POA: Diagnosis present

## 2013-03-23 DIAGNOSIS — I959 Hypotension, unspecified: Secondary | ICD-10-CM

## 2013-03-23 DIAGNOSIS — E785 Hyperlipidemia, unspecified: Secondary | ICD-10-CM | POA: Diagnosis present

## 2013-03-23 DIAGNOSIS — I2510 Atherosclerotic heart disease of native coronary artery without angina pectoris: Secondary | ICD-10-CM

## 2013-03-23 DIAGNOSIS — F17200 Nicotine dependence, unspecified, uncomplicated: Secondary | ICD-10-CM | POA: Diagnosis present

## 2013-03-23 DIAGNOSIS — R109 Unspecified abdominal pain: Secondary | ICD-10-CM

## 2013-03-23 DIAGNOSIS — K219 Gastro-esophageal reflux disease without esophagitis: Secondary | ICD-10-CM | POA: Diagnosis present

## 2013-03-23 DIAGNOSIS — R1110 Vomiting, unspecified: Secondary | ICD-10-CM | POA: Diagnosis present

## 2013-03-23 DIAGNOSIS — E039 Hypothyroidism, unspecified: Secondary | ICD-10-CM | POA: Diagnosis present

## 2013-03-23 DIAGNOSIS — J4489 Other specified chronic obstructive pulmonary disease: Secondary | ICD-10-CM | POA: Diagnosis present

## 2013-03-23 DIAGNOSIS — Z720 Tobacco use: Secondary | ICD-10-CM

## 2013-03-23 DIAGNOSIS — IMO0001 Reserved for inherently not codable concepts without codable children: Secondary | ICD-10-CM | POA: Diagnosis present

## 2013-03-23 DIAGNOSIS — G4730 Sleep apnea, unspecified: Secondary | ICD-10-CM | POA: Diagnosis present

## 2013-03-23 DIAGNOSIS — R4182 Altered mental status, unspecified: Secondary | ICD-10-CM | POA: Diagnosis present

## 2013-03-23 DIAGNOSIS — H9190 Unspecified hearing loss, unspecified ear: Secondary | ICD-10-CM | POA: Diagnosis present

## 2013-03-23 LAB — CBC
HCT: 35.7 % — ABNORMAL LOW (ref 36.0–46.0)
Hemoglobin: 11.9 g/dL — ABNORMAL LOW (ref 12.0–15.0)
MCH: 30.6 pg (ref 26.0–34.0)
MCHC: 33.3 g/dL (ref 30.0–36.0)
MCV: 91.8 fL (ref 78.0–100.0)
Platelets: 230 10*3/uL (ref 150–400)
RBC: 3.89 MIL/uL (ref 3.87–5.11)
RDW: 15.2 % (ref 11.5–15.5)
WBC: 13.6 10*3/uL — ABNORMAL HIGH (ref 4.0–10.5)

## 2013-03-23 LAB — CBC WITH DIFFERENTIAL/PLATELET
Basophils Absolute: 0 10*3/uL (ref 0.0–0.1)
Basophils Relative: 0 % (ref 0–1)
Eosinophils Absolute: 0.1 10*3/uL (ref 0.0–0.7)
Eosinophils Relative: 0 % (ref 0–5)
HCT: 38.7 % (ref 36.0–46.0)
Hemoglobin: 14 g/dL (ref 12.0–15.0)
Lymphocytes Relative: 6 % — ABNORMAL LOW (ref 12–46)
Lymphs Abs: 0.9 10*3/uL (ref 0.7–4.0)
MCH: 32 pg (ref 26.0–34.0)
MCHC: 36.2 g/dL — ABNORMAL HIGH (ref 30.0–36.0)
MCV: 88.4 fL (ref 78.0–100.0)
Monocytes Absolute: 0.7 10*3/uL (ref 0.1–1.0)
Monocytes Relative: 5 % (ref 3–12)
Neutro Abs: 13.4 10*3/uL — ABNORMAL HIGH (ref 1.7–7.7)
Neutrophils Relative %: 89 % — ABNORMAL HIGH (ref 43–77)
Platelets: 280 10*3/uL (ref 150–400)
RBC: 4.38 MIL/uL (ref 3.87–5.11)
RDW: 14.3 % (ref 11.5–15.5)
WBC: 15.1 10*3/uL — ABNORMAL HIGH (ref 4.0–10.5)

## 2013-03-23 LAB — COMPREHENSIVE METABOLIC PANEL
ALT: 20 U/L (ref 0–35)
AST: 24 U/L (ref 0–37)
Albumin: 3.5 g/dL (ref 3.5–5.2)
Alkaline Phosphatase: 76 U/L (ref 39–117)
BUN: 20 mg/dL (ref 6–23)
CO2: 22 mEq/L (ref 19–32)
Calcium: 9.2 mg/dL (ref 8.4–10.5)
Chloride: 105 mEq/L (ref 96–112)
Creatinine, Ser: 0.81 mg/dL (ref 0.50–1.10)
GFR calc Af Amer: 85 mL/min — ABNORMAL LOW (ref >90)
GFR calc non Af Amer: 73 mL/min — ABNORMAL LOW (ref >90)
Glucose, Bld: 119 mg/dL — ABNORMAL HIGH (ref 70–99)
Potassium: 4 mEq/L (ref 3.5–5.1)
Sodium: 135 mEq/L (ref 135–145)
Total Bilirubin: 0.7 mg/dL (ref 0.3–1.2)
Total Protein: 6.7 g/dL (ref 6.0–8.3)

## 2013-03-23 LAB — URINALYSIS, ROUTINE W REFLEX MICROSCOPIC
Bilirubin Urine: NEGATIVE
Glucose, UA: NEGATIVE mg/dL
Hgb urine dipstick: NEGATIVE
Ketones, ur: NEGATIVE mg/dL
Leukocytes, UA: NEGATIVE
Nitrite: NEGATIVE
Protein, ur: NEGATIVE mg/dL
Specific Gravity, Urine: 1.019 (ref 1.005–1.030)
Urobilinogen, UA: 0.2 mg/dL (ref 0.0–1.0)
pH: 6 (ref 5.0–8.0)

## 2013-03-23 LAB — CREATININE, SERUM
Creatinine, Ser: 0.66 mg/dL (ref 0.50–1.10)
GFR calc Af Amer: >90 mL/min (ref >90)
GFR calc non Af Amer: 89 mL/min — ABNORMAL LOW (ref >90)

## 2013-03-23 LAB — TSH: TSH: 0.938 u[IU]/mL (ref 0.350–4.500)

## 2013-03-23 LAB — CG4 I-STAT (LACTIC ACID): Lactic Acid, Venous: 1.32 mmol/L (ref 0.5–2.2)

## 2013-03-23 LAB — PROTIME-INR
INR: 0.98 (ref 0.00–1.49)
Prothrombin Time: 12.9 seconds (ref 11.6–15.2)

## 2013-03-23 LAB — LIPASE, BLOOD: Lipase: 14 U/L (ref 11–59)

## 2013-03-23 LAB — SAMPLE TO BLOOD BANK

## 2013-03-23 MED ORDER — SODIUM CHLORIDE 0.9 % IV SOLN: 1000.0000 mL | Freq: Once | INTRAVENOUS | Status: DC

## 2013-03-23 MED ORDER — SUCRALFATE 1 G PO TABS
1.0000 g | ORAL_TABLET | Freq: Three times a day (TID) | ORAL | Status: DC
  Administered 2013-03-23 – 2013-03-29 (×25): 1 g via ORAL
  Filled 2013-03-23 (×31): qty 1

## 2013-03-23 MED ORDER — AMLODIPINE BESYLATE 10 MG PO TABS
10.0000 mg | ORAL_TABLET | Freq: Every day | ORAL | Status: DC
  Filled 2013-03-23: qty 1

## 2013-03-23 MED ORDER — ALBUTEROL SULFATE HFA 108 (90 BASE) MCG/ACT IN AERS
2.0000 | INHALATION_SPRAY | Freq: Four times a day (QID) | RESPIRATORY_TRACT | Status: DC | PRN
  Administered 2013-03-23 – 2013-03-27 (×2): 2 via RESPIRATORY_TRACT
  Filled 2013-03-23: qty 6.7

## 2013-03-23 MED ORDER — ONDANSETRON HCL 4 MG/2ML IJ SOLN
4.0000 mg | Freq: Four times a day (QID) | INTRAMUSCULAR | Status: DC | PRN
  Administered 2013-03-25: 4 mg via INTRAVENOUS
  Filled 2013-03-23: qty 2

## 2013-03-23 MED ORDER — DEXTROSE-NACL 5-0.9 % IV SOLN
INTRAVENOUS | Status: DC
  Administered 2013-03-23 – 2013-03-25 (×5): via INTRAVENOUS

## 2013-03-23 MED ORDER — IOHEXOL 300 MG/ML  SOLN
50.0000 mL | Freq: Once | INTRAMUSCULAR | Status: AC | PRN
  Administered 2013-03-23: 50 mL via ORAL

## 2013-03-23 MED ORDER — ASPIRIN 81 MG PO CHEW
81.0000 mg | CHEWABLE_TABLET | Freq: Every day | ORAL | Status: DC
  Administered 2013-03-23 – 2013-03-29 (×7): 81 mg via ORAL
  Filled 2013-03-23 (×9): qty 1

## 2013-03-23 MED ORDER — HEPARIN SODIUM (PORCINE) 5000 UNIT/ML IJ SOLN
5000.0000 [IU] | Freq: Three times a day (TID) | INTRAMUSCULAR | Status: DC
  Administered 2013-03-23 – 2013-03-30 (×22): 5000 [IU] via SUBCUTANEOUS
  Filled 2013-03-23 (×25): qty 1

## 2013-03-23 MED ORDER — HYDROCODONE-ACETAMINOPHEN 5-325 MG PO TABS
1.0000 | ORAL_TABLET | Freq: Four times a day (QID) | ORAL | Status: DC | PRN
  Administered 2013-03-23 – 2013-03-24 (×3): 1 via ORAL
  Filled 2013-03-23 (×3): qty 1

## 2013-03-23 MED ORDER — ONDANSETRON HCL 4 MG/2ML IJ SOLN
4.0000 mg | Freq: Once | INTRAMUSCULAR | Status: AC
  Administered 2013-03-23: 4 mg via INTRAVENOUS
  Filled 2013-03-23: qty 2

## 2013-03-23 MED ORDER — SODIUM CHLORIDE 0.9 % IJ SOLN
3.0000 mL | Freq: Two times a day (BID) | INTRAMUSCULAR | Status: DC
  Administered 2013-03-26 – 2013-03-29 (×4): 3 mL via INTRAVENOUS

## 2013-03-23 MED ORDER — LEVOTHYROXINE SODIUM 112 MCG PO TABS
112.0000 ug | ORAL_TABLET | Freq: Every day | ORAL | Status: DC
  Administered 2013-03-23 – 2013-03-29 (×7): 112 ug via ORAL
  Filled 2013-03-23 (×8): qty 1

## 2013-03-23 MED ORDER — ATORVASTATIN CALCIUM 40 MG PO TABS
40.0000 mg | ORAL_TABLET | Freq: Every day | ORAL | Status: DC
  Administered 2013-03-23 – 2013-03-29 (×7): 40 mg via ORAL
  Filled 2013-03-23 (×8): qty 1

## 2013-03-23 MED ORDER — SODIUM CHLORIDE 0.9 % IV SOLN
1000.0000 mL | Freq: Once | INTRAVENOUS | Status: AC
  Administered 2013-03-23: 1000 mL via INTRAVENOUS

## 2013-03-23 MED ORDER — METOPROLOL TARTRATE 25 MG PO TABS
25.0000 mg | ORAL_TABLET | Freq: Two times a day (BID) | ORAL | Status: DC
  Filled 2013-03-23 (×2): qty 1

## 2013-03-23 MED ORDER — TIOTROPIUM BROMIDE MONOHYDRATE 18 MCG IN CAPS
18.0000 ug | ORAL_CAPSULE | Freq: Every day | RESPIRATORY_TRACT | Status: DC
  Administered 2013-03-23 – 2013-03-29 (×6): 18 ug via RESPIRATORY_TRACT
  Filled 2013-03-23 (×2): qty 5

## 2013-03-23 MED ORDER — SODIUM CHLORIDE 0.9 % IV SOLN: 1000.0000 mL | INTRAVENOUS | Status: DC

## 2013-03-23 MED ORDER — PANTOPRAZOLE SODIUM 40 MG PO TBEC
40.0000 mg | DELAYED_RELEASE_TABLET | Freq: Two times a day (BID) | ORAL | Status: DC
  Administered 2013-03-23 – 2013-03-29 (×13): 40 mg via ORAL
  Filled 2013-03-23 (×11): qty 1

## 2013-03-23 MED ORDER — SODIUM CHLORIDE 0.45 % IV SOLN
INTRAVENOUS | Status: DC
  Administered 2013-03-25 – 2013-03-29 (×6): via INTRAVENOUS

## 2013-03-23 MED ORDER — IOHEXOL 350 MG/ML SOLN
100.0000 mL | Freq: Once | INTRAVENOUS | Status: AC | PRN
  Administered 2013-03-23: 100 mL via INTRAVENOUS

## 2013-03-23 MED ORDER — AMITRIPTYLINE HCL 50 MG PO TABS
50.0000 mg | ORAL_TABLET | Freq: Every day | ORAL | Status: DC
  Administered 2013-03-23 – 2013-03-29 (×7): 50 mg via ORAL
  Filled 2013-03-23 (×10): qty 1

## 2013-03-23 MED ORDER — DEXTROSE 5 % IV SOLN
1.0000 g | Freq: Once | INTRAVENOUS | Status: AC
  Administered 2013-03-23: 1 g via INTRAVENOUS
  Filled 2013-03-23: qty 10

## 2013-03-23 MED ORDER — ANASTROZOLE 1 MG PO TABS
1.0000 mg | ORAL_TABLET | Freq: Every day | ORAL | Status: DC
  Administered 2013-03-23 – 2013-03-29 (×7): 1 mg via ORAL
  Filled 2013-03-23 (×8): qty 1

## 2013-03-23 MED ORDER — MOMETASONE FURO-FORMOTEROL FUM 100-5 MCG/ACT IN AERO
2.0000 | INHALATION_SPRAY | Freq: Two times a day (BID) | RESPIRATORY_TRACT | Status: DC
  Administered 2013-03-23 – 2013-03-29 (×14): 2 via RESPIRATORY_TRACT
  Filled 2013-03-23: qty 8.8

## 2013-03-23 MED ORDER — PANTOPRAZOLE SODIUM 40 MG PO TBEC
40.0000 mg | DELAYED_RELEASE_TABLET | Freq: Every day | ORAL | Status: DC
  Administered 2013-03-23: 40 mg via ORAL
  Filled 2013-03-23: qty 1

## 2013-03-23 MED ORDER — ONDANSETRON HCL 4 MG PO TABS: 4.0000 mg | ORAL_TABLET | Freq: Four times a day (QID) | ORAL | Status: DC | PRN

## 2013-03-23 MED ORDER — LORAZEPAM 2 MG/ML IJ SOLN: 1.0000 mg | Freq: Four times a day (QID) | INTRAMUSCULAR | Status: DC

## 2013-03-23 NOTE — ED Notes (Signed)
Pt appears to be very sleepy with slurred speech, will drift off to sleep easily

## 2013-03-23 NOTE — ED Notes (Signed)
Pt to CT at this time.

## 2013-03-23 NOTE — ED Notes (Signed)
Pt returned from CT, to bathroom with asst.  IV NS down to [facility] due to crackles in lung Kaiyan Luczak.

## 2013-03-23 NOTE — ED Notes (Addendum)
C/o generalized abd pain with nausea and vomiting since Saturday night.  Pt hypotensive at triage- taken straight to treatment room and EDP at bedside. Pt reports she has an artery blockage in colon.

## 2013-03-23 NOTE — ED Notes (Signed)
INfusion completed

## 2013-03-23 NOTE — Progress Notes (Signed)
Holding note: Admission note from 0638 hrs reviewed along with labs and imaging reports. Vitals are stable.  Plan- will see this PM  M.Nasrin Lanzo, MD (o) [phone] (c) [phone] After 6PM  Call grp: TIM  [phone]

## 2013-03-23 NOTE — H&P (Addendum)
[facility] History and Physical  [name] JYN:[id number] DOB: 1946/01/07    PCP:   Illene Regulus, MD   Chief Complaint: abdominal pain.  HPI: [name] is an 67 y.o. female  With hx of SMA occlusion syndrome, hyperlipidemia, chronic pain syndrome, HTN, breast cancer, COPD, hypothyroidism, presents to the ER with increase abdominal pain for the past 2 days.  She denied pain with eating and had n't lost more weight.  She had no fever or chills, black or bloody stool.  She said she was taken 2 pain pills every 4 to 6 hours, and was found in the ER to be lethargic and hypotensive.  She was given IVF with improvement of her BP to SBP 120.  Evaluation in the ER included a abdominal pelvic CT with contrast showed SMA and Ciliac artery with occlusion, but with distal reconstitution, and no ischemic bowel changes.  She has a leukocytosis with WBC of 15K, and normal electrolytes with normal renal fx tests.  Hospitalilst was asked to admit her because original BP was low, and there may have been a bit too much narcotic she may have been taken.  Rewiew of Systems:  Constitutional: Negative for malaise, fever and chills. No significant weight loss or weight gain Eyes: Negative for eye pain, redness and discharge, diplopia, visual changes, or flashes of light. ENMT: Negative for ear pain, hoarseness, nasal congestion, sinus pressure and sore throat. No headaches; tinnitus, drooling, or problem swallowing. Cardiovascular: Negative for chest pain, palpitations, diaphoresis, dyspnea and peripheral edema. ; No orthopnea, PND Respiratory: Negative for cough, hemoptysis, wheezing and stridor. No pleuritic chestpain. Gastrointestinal: Negative for nausea, vomiting, diarrhea, constipation, melena, blood in stool, hematemesis, jaundice and rectal bleeding.    Genitourinary: Negative for frequency, dysuria, incontinence,flank pain and hematuria; Musculoskeletal: Negative for back pain and neck pain.  Negative for swelling and trauma.;  Skin: . Negative for pruritus, rash, abrasions, bruising and skin lesion.; ulcerations Neuro: Negative for headache, lightheadedness and neck stiffness. Negative for weakness, altered level of consciousness ,  extremity weakness, burning feet, involuntary movement, seizure and syncope.  Psych: negative for anxiety, depression, insomnia, tearfulness, panic attacks, hallucinations, paranoia, suicidal or homicidal ideation    Past Medical History  Diagnosis Date  . COPD (chronic obstructive pulmonary disease)   . GERD (gastroesophageal reflux disease)   . Hypertension   . Diverticulitis     required colcectomy  . Osteoporosis   . Hyperlipidemia   . Emphysema of lung   . Cancer 2010    invasive ductal breast cancer; on tamoxifen  . Fibromyalgia 1995  . Hx of colonic polyps     Dr. Matthias Hughs -last study '11  . Peripheral vascular disease May '12 - CT angio    SMA chronic occlusion; stenosis of celiac trunk.  . Hypothyroidism   . Hearing loss of both ears     uses hearing aids  bilaterally  . Varicose veins of leg with swelling     varicose vein surgery - Dr. Guss Bunde  . Complication of anesthesia     was in ICU after lung surgery 2012  . Wears glasses     Past Surgical History  Procedure Laterality Date  . Appendectomy    . Wedge resection  1993    right upper lobe of lung as part of VAT-nodule/benign  . Colonoscopy w/ polypectomy    . Abdominal hysterectomy  1973    metorrhagia  . Colectomy  2002    sigmoid  . Cardiac  catheterization  '11    no obstructive coronary disease  . Breast surgery  2010    mastectomy with reconstructive surgery  . Breast surgery  2010    rt lump x2  . Breast surgery  2010    partial rt br reduction  . Lung surgery  9/12    rt mid lung wedge  . Partial mastectomy with needle localization Left 02/23/2013    Procedure:  LEFT PARTIAL MASTECTOMY WITH NEEDLE LOCALIZATION;  Surgeon: [name], MD;   Location: [facility];  Service: General;  Laterality: Left;    Medications:  HOME MEDS: Prior to Admission medications   Medication Sig Start Date End Date Taking? Authorizing Provider  albuterol (PROVENTIL HFA;VENTOLIN HFA) 108 (90 BASE) MCG/ACT inhaler Inhale 2 puffs into the lungs every 6 (six) hours as needed for wheezing. As a RESCUE medication 03/31/12 03/31/13 Yes Jacques Navy, MD  amitriptyline (ELAVIL) 25 MG tablet Take 50 mg by mouth at bedtime.   Yes Historical Provider, MD  amLODipine (NORVASC) 10 MG tablet Take 10 mg by mouth daily.   Yes Historical Provider, MD  anastrozole (ARIMIDEX) 1 MG tablet Take 1 mg by mouth daily.   Yes Historical Provider, MD  aspirin 81 MG tablet Take 81 mg by mouth daily.    Yes Historical Provider, MD  atorvastatin (LIPITOR) 40 MG tablet Take 40 mg by mouth daily.   Yes Historical Provider, MD  B Complex Vitamins (VITAMIN-B COMPLEX PO) Take 1 tablet by mouth daily.    Yes Historical Provider, MD  Calcium Carbonate-Vitamin D (CALCIUM + D PO) Take 600 mg by mouth daily.    Yes Historical Provider, MD  Fluticasone-Salmeterol (ADVAIR) 100-50 MCG/DOSE AEPB Inhale 1 puff into the lungs 2 (two) times daily. As a MAINTENANCE medication. 03/31/12  Yes Jacques Navy, MD  HYDROcodone-acetaminophen (NORCO/VICODIN) 5-325 MG per tablet Take 1-2 tablets by mouth every 4 (four) hours as needed for pain. 02/23/13  Yes [name], MD  levothyroxine (SYNTHROID, LEVOTHROID) 112 MCG tablet Take 1 tablet (112 mcg total) by mouth daily. 01/22/13  Yes Jacques Navy, MD  LORazepam (ATIVAN) 2 MG tablet Take 2 mg by mouth every 8 (eight) hours as needed for anxiety. 12/17/12  Yes Jacques Navy, MD  metoCLOPramide (REGLAN) 10 MG tablet Take 10 mg by mouth 4 (four) times daily. 30 minutes before each meal and at bedtime   Yes Historical Provider, MD  metoprolol tartrate (LOPRESSOR) 25 MG tablet Take 1 tablet (25 mg total) by mouth 2 (two) times daily.  01/20/13  Yes Jacques Navy, MD  omeprazole (PRILOSEC) 40 MG capsule Take 40 mg by mouth 2 (two) times daily.   Yes Historical Provider, MD  tiotropium (SPIRIVA) 18 MCG inhalation capsule Place 18 mcg into inhaler and inhale daily. As a MAINTENANCE medication 03/31/12 03/31/13 Yes Jacques Navy, MD     Allergies:  Allergies  Allergen Reactions  . Sulfonamide Derivatives Swelling    Social History:   reports that she quit smoking about a year ago. Her smoking use included Cigarettes. She smoked 0.75 packs per day. She has never used smokeless tobacco. She reports that she drinks about 6.0 ounces of alcohol per week. She reports that she does not use illicit drugs.  Family History: Family History  Problem Relation Age of Onset  . Colon cancer Mother     colon  . COPD Mother     brown lung  . Hypertension Mother   .  Diabetes Mother   . Emphysema Mother   . Coronary artery disease Father   . Heart attack Father   . Sudden death Father   . Heart disease Father   . Cancer Sister     fallopian tube  . Hypertension Sister   . Coronary artery disease Brother   . Throat cancer Sister   . Melanoma Sister   . Hypertension Sister   . Pancreatic cancer Sister   . Cancer Sister   . Heart attack Brother     early 22's     Physical Exam: Filed Vitals:   03/23/13 0445 03/23/13 0515 03/23/13 0545 03/23/13 0615  BP: 116/59 111/62 101/78 102/64  Pulse: 83 85 86 88  Temp:      TempSrc:      Resp: 22 22 22 21   SpO2: 97% 95% 96% 94%   Blood pressure 102/64, pulse 88, temperature 97.5 F (36.4 C), temperature source Oral, resp. rate 21, SpO2 94.00%.  GEN:  Pleasant patient lying in the stretcher in no acute distress; cooperative with exam. Slightly sleepy. PSYCH:  alert and oriented x4; does not appear anxious or depressed; affect is appropriate. HEENT: Mucous membranes pink and anicteric; PERRLA; EOM intact; no cervical lymphadenopathy nor thyromegaly or carotid bruit; no JVD;  There were no stridor. Neck is very supple. Breasts:: Not examined CHEST WALL: No tenderness CHEST: Normal respiration, clear to auscultation bilaterally.  HEART: Regular rate and rhythm.  There are no murmur, rub, or gallops.   BACK: No kyphosis or scoliosis; no CVA tenderness ABDOMEN: soft and mildly tender with no rebound.no masses, no organomegaly, normal abdominal bowel sounds; no pannus; no intertriginous candida. There is no rebound and no distention. Rectal Exam: Not done EXTREMITIES: No bone or joint deformity; age-appropriate arthropathy of the hands and knees; no edema; no ulcerations.  There is no calf tenderness. Genitalia: not examined PULSES: 2+ and symmetric SKIN: Normal hydration no rash or ulceration CNS: Cranial nerves 2-12 grossly intact no focal lateralizing neurologic deficit.  Speech is fluent; uvula elevated with phonation, facial symmetry and tongue midline. DTR are normal bilaterally, cerebella exam is intact, barbinski is negative and strengths are equaled bilaterally.  No sensory loss.   Labs on Admission:  Basic Metabolic Panel:  Recent Labs Lab 03/23/13 0034  NA 135  K 4.0  CL 105  CO2 22  GLUCOSE 119*  BUN 20  CREATININE 0.81  CALCIUM 9.2   Liver Function Tests:  Recent Labs Lab 03/23/13 0034  AST 24  ALT 20  ALKPHOS 76  BILITOT 0.7  PROT 6.7  ALBUMIN 3.5    Recent Labs Lab 03/23/13 0034  LIPASE 14   No results found for this basename: AMMONIA,  in the last 168 hours CBC:  Recent Labs Lab 03/23/13 0034  WBC 15.1*  NEUTROABS 13.4*  HGB 14.0  HCT 38.7  MCV 88.4  PLT 280   Cardiac Enzymes: No results found for this basename: CKTOTAL, CKMB, CKMBINDEX, TROPONINI,  in the last 168 hours  CBG: No results found for this basename: GLUCAP,  in the last 168 hours   Radiological Exams on Admission: Ct Angio Abd/pel W/ And/or W/o  03/23/2013   *RADIOLOGY REPORT*  Clinical Data:  Severe diffuse abdominal pain.  History of SMA and  celiac chronic occlusions.  Hypotension.  CT ANGIOGRAPHY ABDOMEN AND PELVIS  Technique:  Multidetector CT imaging of the abdomen and pelvis was performed using the standard protocol during bolus administration of intravenous contrast.  Multiplanar  reconstructed images including MIPs were obtained and reviewed to evaluate the vascular anatomy.  Contrast: OMNIPAQUE IOHEXOL 350 MG/ML SOLN  Comparison:  12/08/2010  Findings:  Mild reticular nodular interstitial changes in the lung bases suggest interstitial infiltration or edema.  Slight fibrosis and emphysematous changes present.  Small esophageal hiatal hernia with contrast material in the lower esophagus suggesting reflux. Right breast implant.  Diffuse calcification of the abdominal aorta and branch vessels with normal caliber.  No aneurysm identified.  There is proximal occlusion of the celiac axis with prompt distal reconstitution of flow to the celiac axis.  There is proximal occlusion of the superior mesenteric artery with occlusion of approximately 3.3 cm segment and distal reconstitution subsequently.  The inferior mesenteric artery is patent and is prominent in appearance.  No evidence of aortic dissection.  There is calcification in the origin of the single renal arteries bilaterally but the nephrograms are symmetrical and there is no evidence for renal arterial occlusion.  The common iliac, external iliac, and common femoral arteries bilaterally are patent with diffuse calcification.  Lobular architecture of the spleen suggest either prior splenic infarct or previous resection of the spleen with hypertrophic splenules.  This appearance is stable.  The liver, gallbladder, pancreas, adrenal glands, kidneys, inferior vena cava, and retroperitoneal lymph nodes are unremarkable.  Stool filled colon without distension.  Small bowel is not distended.  No gastric or small bowel wall thickening identified.  No free air or free fluid in the abdomen.  Pelvis:   Uterus appears to be surgically absent.  No abnormal adnexal masses.  Bladder wall is not thickened.  Appendix is not demonstrated.  No evidence of diverticulitis.  Degenerative changes in the lumbar spine.   Review of the MIP images confirms the above findings.  IMPRESSION: Diffuse atherosclerotic disease in the abdominal aorta and branch vessels.  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  No CT changes suggesting bowel ischemia or infarct.  Small esophageal hiatal hernia with gastroesophageal reflux or dysmotility suggested. Lobular architecture of the spleen.  Interstitial pattern in the lungs.   Original Report Authenticated By: Burman Nieves, M.D.   Assessment/Plan Present on Admission:  . Tobacco abuse . HYPOTHYROIDISM . HYPERTENSION . HYPERLIPIDEMIA . PAD (peripheral artery disease) . SLEEP APNEA . Altered mental status  PLAN:  I suspect she may have taken a little more pain medicine yesterday, resulting in hypotension and lethargy.  She is stable, and will be admitted to continue IVF and what her for excessive sedation.  I have held her benzodiazepines for now.  Her SMA syndrome seems stable.  Though she has leukocytosis, there has been no fever nor other evidence suggestive of infection, so will not start her on any antibiotics.  She is stable, full code, and will be admitted to Dr Debby Bud service as per prior arrangement.  Thank you for allowing me to partake in the care of your nice patient.   Other plans as per orders.  Code Status: FULL Unk Lightning, MD. [facility] Pager [phone] 7pm to 7am.  03/23/2013, 6:38 AM

## 2013-03-23 NOTE — ED Notes (Signed)
BP- 67/ palpated.  Last BM 2 days ago (normal per pt)

## 2013-03-23 NOTE — Progress Notes (Signed)
UR COMPLETED  

## 2013-03-23 NOTE — ED Provider Notes (Addendum)
History     CSN: [id number]  Arrival date & time 03/23/13  0009   First MD Initiated Contact with Patient 03/23/13 0032      Chief Complaint  Patient presents with  . Abdominal Pain    (Consider location/radiation/quality/duration/timing/severity/associated sxs/prior treatment) HPI 67 yo female presents from home with complaint of severe abd pain x 2 days, worsening tonight.  She has had nausea but dry heaves only.  Last BM 2 days ago, which is normal for her.  Pt noted to be lethargic and hypotensive in triage.  She reports taking 2 vicodin at around 7 pm and naprosyn throughout the day without improvement in symptoms.  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  Pt reports various other arteries are blocked as well-leg and heart. She has had no symptoms from her blockages for several years.  Pain over the weekend not related to eating-constant, unrelenting.  No fevers, chills, chest pain, sob.  Pain generalized.  Pt also with h/o diverticulitis, requiring colectomy in the past.  Pt with pmh also of breast cancer, on anastrozole.  No weight loss-reports actual weight gain without intention.  Past Medical History  Diagnosis Date  . COPD (chronic obstructive pulmonary disease)   . GERD (gastroesophageal reflux disease)   . Hypertension   . Diverticulitis     required colcectomy  . Osteoporosis   . Hyperlipidemia   . Emphysema of lung   . Cancer 2010    invasive ductal breast cancer; on tamoxifen  . Fibromyalgia 1995  . Hx of colonic polyps     Dr. Matthias Hughs -last study '11  . Peripheral vascular disease May '12 - CT angio    SMA chronic occlusion; stenosis of celiac trunk.  . Hypothyroidism   . Hearing loss of both ears     uses hearing aids  bilaterally  . Varicose veins of leg with swelling     varicose vein surgery - Dr. Guss Bunde  . Complication of anesthesia     was  in ICU after lung surgery 2012  . Wears glasses     Past Surgical History  Procedure Laterality Date  . Appendectomy    . Wedge resection  1993    right upper lobe of lung as part of VAT-nodule/benign  . Colonoscopy w/ polypectomy    . Abdominal hysterectomy  1973    metorrhagia  . Colectomy  2002    sigmoid  . Cardiac catheterization  '11    no obstructive coronary disease  . Breast surgery  2010    mastectomy with reconstructive surgery  . Breast surgery  2010    rt lump x2  . Breast surgery  2010    partial rt br reduction  . Lung surgery  9/12    rt mid lung wedge  . Partial mastectomy with needle localization Left 02/23/2013    Procedure:  LEFT PARTIAL MASTECTOMY WITH NEEDLE LOCALIZATION;  Surgeon: [name], MD;  Location: [facility];  Service: General;  Laterality: Left;    Family History  Problem Relation Age of Onset  . Colon cancer Mother     colon  . COPD Mother     brown lung  . Hypertension Mother   . Diabetes Mother   . Emphysema Mother   . Coronary artery disease Father   . Heart attack Father   . Sudden death Father   .  Heart disease Father   . Cancer Sister     fallopian tube  . Hypertension Sister   . Coronary artery disease Brother   . Throat cancer Sister   . Melanoma Sister   . Hypertension Sister   . Pancreatic cancer Sister   . Cancer Sister   . Heart attack Brother     early 45's    History  Substance Use Topics  . Smoking status: Former Smoker -- 0.75 packs/day    Types: Cigarettes    Quit date: 03/18/2012  . Smokeless tobacco: Never Used     Comment: smokes less   . Alcohol Use: 6.0 oz/week    10 Cans of beer per week     Comment: occ    OB History   Grav Para Term Preterm Abortions TAB SAB Ect Mult Living                  Review of Systems  See History of Present Illness; otherwise all other systems are reviewed and negative  Allergies  Sulfonamide derivatives  Home Medications   Current  Outpatient Rx  Name  Route  Sig  Dispense  Refill  . albuterol (PROVENTIL HFA;VENTOLIN HFA) 108 (90 BASE) MCG/ACT inhaler   Inhalation   Inhale 2 puffs into the lungs every 6 (six) hours as needed for wheezing. As a RESCUE medication   1 Inhaler   2   . amitriptyline (ELAVIL) 25 MG tablet   Oral   Take 50 mg by mouth at bedtime.         Marland Kitchen amLODipine (NORVASC) 10 MG tablet   Oral   Take 10 mg by mouth daily.         Marland Kitchen anastrozole (ARIMIDEX) 1 MG tablet   Oral   Take 1 mg by mouth daily.         Marland Kitchen aspirin 81 MG tablet   Oral   Take 81 mg by mouth daily.          Marland Kitchen atorvastatin (LIPITOR) 40 MG tablet   Oral   Take 40 mg by mouth daily.         . B Complex Vitamins (VITAMIN-B COMPLEX PO)   Oral   Take 1 tablet by mouth daily.          . Calcium Carbonate-Vitamin D (CALCIUM + D PO)   Oral   Take 600 mg by mouth daily.          . Fluticasone-Salmeterol (ADVAIR) 100-50 MCG/DOSE AEPB   Inhalation   Inhale 1 puff into the lungs 2 (two) times daily. As a MAINTENANCE medication.   1 each   3   . HYDROcodone-acetaminophen (NORCO/VICODIN) 5-325 MG per tablet   Oral   Take 1-2 tablets by mouth every 4 (four) hours as needed for pain.         Marland Kitchen levothyroxine (SYNTHROID, LEVOTHROID) 112 MCG tablet   Oral   Take 1 tablet (112 mcg total) by mouth daily.   30 tablet   5     PATIENT DUE FOR A ROUTINE PHYSICAL EXAM   . LORazepam (ATIVAN) 2 MG tablet   Oral   Take 2 mg by mouth every 8 (eight) hours as needed for anxiety.         . metoCLOPramide (REGLAN) 10 MG tablet   Oral   Take 10 mg by mouth 4 (four) times daily. 30 minutes before each meal and at bedtime         .  metoprolol tartrate (LOPRESSOR) 25 MG tablet   Oral   Take 1 tablet (25 mg total) by mouth 2 (two) times daily.   60 tablet   2   . omeprazole (PRILOSEC) 40 MG capsule   Oral   Take 40 mg by mouth 2 (two) times daily.         Marland Kitchen tiotropium (SPIRIVA) 18 MCG inhalation capsule    Inhalation   Place 18 mcg into inhaler and inhale daily. As a MAINTENANCE medication           BP 129/62  Pulse 76  Temp(Src) 97.5 F (36.4 C) (Oral)  Resp 22  SpO2 99%  Physical Exam  Nursing note and vitals reviewed. Constitutional: She is oriented to person, place, and time. She appears well-developed and well-nourished.  lethargic  HENT:  Head: Normocephalic and atraumatic.  Nose: Nose normal.  Dry mucous membranes  Eyes: Conjunctivae and EOM are normal.  Pupils small but reactive  Neck: Normal range of motion. Neck supple. No JVD present. No tracheal deviation present. No thyromegaly present.  Cardiovascular: Normal rate, regular rhythm, normal heart sounds and intact distal pulses.  Exam reveals no gallop and no friction rub.   No murmur heard. hypotension  Pulmonary/Chest: Effort normal and breath sounds normal. No stridor. No respiratory distress. She has no wheezes. She has no rales. She exhibits no tenderness.  Abdominal: Soft. She exhibits no distension and no mass. There is tenderness (diffuse tenderness). There is no rebound and no guarding.  Hyperactive bowel sounds  Musculoskeletal: Normal range of motion. She exhibits no edema and no tenderness.  Lymphadenopathy:    She has no cervical adenopathy.  Neurological: She is oriented to person, place, and time. She has normal reflexes. No cranial nerve deficit. She exhibits normal muscle tone. Coordination normal.  Sleepy, lethargic  Skin: Skin is warm and dry. No rash noted. No erythema. No pallor.  Psychiatric: She has a normal mood and affect. Her behavior is normal. Judgment and thought content normal.    ED Course  Procedures (including critical care time)  CRITICAL CARE Performed by: Olivia Mackie Total critical care time: 30 min Critical care time was exclusive of separately billable procedures and treating other patients. Critical care was necessary to treat or prevent imminent or life-threatening  deterioration. Critical care was time spent personally by me on the following activities: development of treatment plan with patient and/or surrogate as well as nursing, discussions with consultants, evaluation of patient's response to treatment, examination of patient, obtaining history from patient or surrogate, ordering and performing treatments and interventions, ordering and review of laboratory studies, ordering and review of radiographic studies, pulse oximetry and re-evaluation of patient's condition.   Labs Reviewed  CBC WITH DIFFERENTIAL - Abnormal; Notable for the following:    WBC 15.1 (*)    MCHC 36.2 (*)    Neutrophils Relative % 89 (*)    Neutro Abs 13.4 (*)    Lymphocytes Relative 6 (*)    All other components within normal limits  PROTIME-INR  COMPREHENSIVE METABOLIC PANEL  URINALYSIS, ROUTINE W REFLEX MICROSCOPIC  LIPASE, BLOOD  CG4 I-STAT (LACTIC ACID)  SAMPLE TO BLOOD BANK   Ct Angio Abd/pel W/ And/or W/o  03/23/2013   *RADIOLOGY REPORT*  Clinical Data:  Severe diffuse abdominal pain.  History of SMA and celiac chronic occlusions.  Hypotension.  CT ANGIOGRAPHY ABDOMEN AND PELVIS  Technique:  Multidetector CT imaging of the abdomen and pelvis was performed using the standard protocol during  bolus administration of intravenous contrast.  Multiplanar reconstructed images including MIPs were obtained and reviewed to evaluate the vascular anatomy.  Contrast: OMNIPAQUE IOHEXOL 350 MG/ML SOLN  Comparison:  12/08/2010  Findings:  Mild reticular nodular interstitial changes in the lung bases suggest interstitial infiltration or edema.  Slight fibrosis and emphysematous changes present.  Small esophageal hiatal hernia with contrast material in the lower esophagus suggesting reflux. Right breast implant.  Diffuse calcification of the abdominal aorta and branch vessels with normal caliber.  No aneurysm identified.  There is proximal occlusion of the celiac axis with prompt distal  reconstitution of flow to the celiac axis.  There is proximal occlusion of the superior mesenteric artery with occlusion of approximately 3.3 cm segment and distal reconstitution subsequently.  The inferior mesenteric artery is patent and is prominent in appearance.  No evidence of aortic dissection.  There is calcification in the origin of the single renal arteries bilaterally but the nephrograms are symmetrical and there is no evidence for renal arterial occlusion.  The common iliac, external iliac, and common femoral arteries bilaterally are patent with diffuse calcification.  Lobular architecture of the spleen suggest either prior splenic infarct or previous resection of the spleen with hypertrophic splenules.  This appearance is stable.  The liver, gallbladder, pancreas, adrenal glands, kidneys, inferior vena cava, and retroperitoneal lymph nodes are unremarkable.  Stool filled colon without distension.  Small bowel is not distended.  No gastric or small bowel wall thickening identified.  No free air or free fluid in the abdomen.  Pelvis:  Uterus appears to be surgically absent.  No abnormal adnexal masses.  Bladder wall is not thickened.  Appendix is not demonstrated.  No evidence of diverticulitis.  Degenerative changes in the lumbar spine.   Review of the MIP images confirms the above findings.  IMPRESSION: Diffuse atherosclerotic disease in the abdominal aorta and branch vessels.  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  No CT changes suggesting bowel ischemia or infarct.  Small esophageal hiatal hernia with gastroesophageal reflux or dysmotility suggested. Lobular architecture of the spleen.  Interstitial pattern in the lungs.   Original Report Authenticated By: Burman Nieves, M.D.    Date: 03/23/2013  Rate: 78  Rhythm: normal sinus rhythm  QRS Axis: normal  Intervals: normal  ST/T Wave abnormalities: normal  Conduction Disutrbances:none   Narrative Interpretation:   Old EKG Reviewed: unchanged    1. Abdominal pain   2. Leukocytosis   3. Hypotension   4. Altered mental status       MDM  67 yo female presents to the ER from home with 2 days of severe abd pain, now found to be hypotensive.  H/o diverticulitis and chronic mesenteric occlusion.  Hypotension has responded to fluids.  Diffuse abd pain.  Normal lactate.  Leukocytosis noted.  D/w radiology-to have CT angio abd/pelvis as well as oral contrast/venous phase scan given differential of both mesenteric ischemia and diverticulitis.  Will closely monitor.   No signs of acute mesenteric ischemia.  Hypotension has improved with IV fluid boluses.  I am sure of etiology of her initial severe hypotension.  Given her multiple medical problems, and persistent abdominal pain, and slightly decreased mental status, discussed with hospitalist for admission.        Olivia Mackie, MD 03/23/13 1610  Olivia Mackie, MD 03/23/13 [phone]

## 2013-03-23 NOTE — Progress Notes (Signed)
Subjective: [name] admitted due to abdominal pain, somnolence and hypotension. She has vascular disease with previous SMA occlusion. As part of initial evaluation  CT angio revealed graft patency SMA. There was a question of over use of narcotic pain medication contributing to somnolence and hypotension.  During the day she did c/o pain and Norco 5/325 was restarted at a q6 interval. BP has remained soft.   This PM she continues to c/o diffuse abdominal pain. She admits to nausea and worsening pain with eating. No chest pain, no c/o SOB Objective: Lab:  Recent Labs  03/23/13 0034 03/23/13 0835  WBC 15.1* 13.6*  NEUTROABS 13.4*  --   HGB 14.0 11.9*  HCT 38.7 35.7*  MCV 88.4 91.8  PLT 280 230    Recent Labs  03/23/13 0034 03/23/13 0835  NA 135  --   K 4.0  --   CL 105  --   GLUCOSE 119*  --   BUN 20  --   CREATININE 0.81 0.66  CALCIUM 9.2  --     Imaging: CT angio abdomen: IMPRESSION:  Diffuse atherosclerotic disease in the abdominal aorta and branch  vessels. Proximal occlusion of the celiac axis and superior  mesenteric artery with distal reconstitution. No CT changes  suggesting bowel ischemia or infarct. Small esophageal hiatal  hernia with gastroesophageal reflux or dysmotility suggested.  Lobular architecture of the spleen. Interstitial pattern in the  lungs.  Scheduled Meds: . amitriptyline  50 mg Oral QHS  . anastrozole  1 mg Oral Daily  . aspirin  81 mg Oral Daily  . atorvastatin  40 mg Oral Daily  . heparin  5,000 Units Subcutaneous Q8H  . levothyroxine  112 mcg Oral Daily  . mometasone-formoterol  2 puff Inhalation BID  . pantoprazole  40 mg Oral Daily  . sodium chloride  3 mL Intravenous Q12H  . tiotropium  18 mcg Inhalation Daily   Continuous Infusions: . dextrose 5 % and 0.9% NaCl 75 mL/hr at 03/23/13 0707   PRN Meds:.albuterol, HYDROcodone-acetaminophen, ondansetron (ZOFRAN) IV, ondansetron   Physical Exam: Filed Vitals:   03/23/13 1700   BP: 109/50  Pulse: 92  Temp: 98.7 F (37.1 C)  Resp: 16    Intake/Output Summary (Last 24 hours) at 03/23/13 1829 Last data filed at 03/23/13 1800  Gross per 24 hour  Intake    960 ml  Output   1360 ml  Net   -400 ml   Gen'l- overweight white woman in no acute distress HEENT - C&S clear Cor 2+ radial, RRR Pulm - normal respirations, no wheezing, coarse end-expiratory rhonchi Abd - bloated, diffuse tenderness but worst at epigastrum, no guarding or rebound, hypoactive BS Neuro - A&O x 3, MAE      Assessment/Plan: 1. Abdominal pain - normal CT angio. She has hypoactive BS and bloating. Pain does sound potentially GI in nature. No diarrhea  Plan Increase protonix to bid  Add carafate AC/HS  Clear liquid diet  2 view abdomen  F/u Bmet in AM  2. Leukocytosis - no fever. Dermargination due to pain vs intra-abdominal source  Plan  see GI above  Rocephin 1 g IV - one time dose  F/u CBCD  3. Cardiac - normal telemetry. No cardiac symptoms  Plan D/c telemetry  4. HTN - BP has been soft  Plan Holding BP meds - metoprolol and amlodipine  5. PUlm - no increased WOB  Plan  continue home meds    [employer]  IM (o) [phone]; (c) [phone] Call-grp - [facility] IM  Tele: [phone]  03/23/2013, 6:07 PM

## 2013-03-24 ENCOUNTER — Observation Stay (HOSPITAL_COMMUNITY): Payer: Medicare Other

## 2013-03-24 DIAGNOSIS — I10 Essential (primary) hypertension: Secondary | ICD-10-CM

## 2013-03-24 DIAGNOSIS — J449 Chronic obstructive pulmonary disease, unspecified: Secondary | ICD-10-CM

## 2013-03-24 DIAGNOSIS — R109 Unspecified abdominal pain: Secondary | ICD-10-CM

## 2013-03-24 DIAGNOSIS — I2510 Atherosclerotic heart disease of native coronary artery without angina pectoris: Secondary | ICD-10-CM

## 2013-03-24 DIAGNOSIS — D72829 Elevated white blood cell count, unspecified: Secondary | ICD-10-CM

## 2013-03-24 DIAGNOSIS — F17200 Nicotine dependence, unspecified, uncomplicated: Secondary | ICD-10-CM

## 2013-03-24 DIAGNOSIS — E039 Hypothyroidism, unspecified: Secondary | ICD-10-CM

## 2013-03-24 DIAGNOSIS — I959 Hypotension, unspecified: Secondary | ICD-10-CM

## 2013-03-24 DIAGNOSIS — R4182 Altered mental status, unspecified: Secondary | ICD-10-CM

## 2013-03-24 DIAGNOSIS — J4489 Other specified chronic obstructive pulmonary disease: Secondary | ICD-10-CM

## 2013-03-24 LAB — CBC WITH DIFFERENTIAL/PLATELET
Basophils Absolute: 0 10*3/uL (ref 0.0–0.1)
Basophils Relative: 0 % (ref 0–1)
Eosinophils Absolute: 0 10*3/uL (ref 0.0–0.7)
Eosinophils Relative: 0 % (ref 0–5)
HCT: 39.3 % (ref 36.0–46.0)
Hemoglobin: 13.1 g/dL (ref 12.0–15.0)
Lymphocytes Relative: 8 % — ABNORMAL LOW (ref 12–46)
Lymphs Abs: 0.9 10*3/uL (ref 0.7–4.0)
MCH: 30.4 pg (ref 26.0–34.0)
MCHC: 33.3 g/dL (ref 30.0–36.0)
MCV: 91.2 fL (ref 78.0–100.0)
Monocytes Absolute: 0.7 10*3/uL (ref 0.1–1.0)
Monocytes Relative: 6 % (ref 3–12)
Neutro Abs: 9.4 10*3/uL — ABNORMAL HIGH (ref 1.7–7.7)
Neutrophils Relative %: 85 % — ABNORMAL HIGH (ref 43–77)
Platelets: 259 10*3/uL (ref 150–400)
RBC: 4.31 MIL/uL (ref 3.87–5.11)
RDW: 15.2 % (ref 11.5–15.5)
WBC: 11 10*3/uL — ABNORMAL HIGH (ref 4.0–10.5)

## 2013-03-24 LAB — BASIC METABOLIC PANEL
BUN: 7 mg/dL (ref 6–23)
CO2: 23 mEq/L (ref 19–32)
Calcium: 9.1 mg/dL (ref 8.4–10.5)
Chloride: 104 mEq/L (ref 96–112)
Creatinine, Ser: 0.61 mg/dL (ref 0.50–1.10)
GFR calc Af Amer: >90 mL/min (ref >90)
GFR calc non Af Amer: >90 mL/min (ref >90)
Glucose, Bld: 153 mg/dL — ABNORMAL HIGH (ref 70–99)
Potassium: 3.5 mEq/L (ref 3.5–5.1)
Sodium: 134 mEq/L — ABNORMAL LOW (ref 135–145)

## 2013-03-24 LAB — LIPASE, BLOOD: Lipase: 10 U/L — ABNORMAL LOW (ref 11–59)

## 2013-03-24 LAB — MAGNESIUM: Magnesium: 1.8 mg/dL (ref 1.5–2.5)

## 2013-03-24 MED ORDER — TRAMADOL HCL 50 MG PO TABS
100.0000 mg | ORAL_TABLET | Freq: Three times a day (TID) | ORAL | Status: DC | PRN
  Administered 2013-03-24 – 2013-03-29 (×10): 100 mg via ORAL
  Filled 2013-03-24 (×9): qty 2

## 2013-03-24 MED ORDER — BISACODYL 10 MG RE SUPP
10.0000 mg | Freq: Two times a day (BID) | RECTAL | Status: DC | PRN
  Administered 2013-03-24 – 2013-03-29 (×7): 10 mg via RECTAL
  Filled 2013-03-24 (×7): qty 1

## 2013-03-24 MED ORDER — METOCLOPRAMIDE HCL 5 MG/ML IJ SOLN
5.0000 mg | Freq: Three times a day (TID) | INTRAMUSCULAR | Status: DC
  Administered 2013-03-24 – 2013-03-29 (×14): 5 mg via INTRAVENOUS
  Filled 2013-03-24 (×17): qty 1

## 2013-03-24 MED ORDER — LORAZEPAM 2 MG/ML IJ SOLN
1.0000 mg | Freq: Four times a day (QID) | INTRAMUSCULAR | Status: DC | PRN
  Administered 2013-03-25 – 2013-03-26 (×2): 1 mg via INTRAVENOUS
  Filled 2013-03-24 (×2): qty 1

## 2013-03-24 MED ORDER — BISACODYL 10 MG RE SUPP
10.0000 mg | Freq: Every day | RECTAL | Status: DC | PRN
  Administered 2013-03-24: 10 mg via RECTAL
  Filled 2013-03-24: qty 1

## 2013-03-24 MED ORDER — AMLODIPINE BESYLATE 5 MG PO TABS
5.0000 mg | ORAL_TABLET | Freq: Every day | ORAL | Status: DC
  Administered 2013-03-24 – 2013-03-29 (×6): 5 mg via ORAL
  Filled 2013-03-24 (×8): qty 1

## 2013-03-24 NOTE — Progress Notes (Signed)
PM note Subjective: Appreciate Mr. [name] and Dr. Eliberto [name] consult.  [name] reports that vicodin helps but doesn't last 6 hrs. NO breathing problems  Objective: Lab:  Recent Labs  04-18-2013 0034 04/18/2013 0835 03/24/13 0450  WBC 15.1* 13.6* 11.0*  NEUTROABS 13.4*  --  9.4*  HGB 14.0 11.9* 13.1  HCT 38.7 35.7* 39.3  MCV 88.4 91.8 91.2  PLT 280 230 259    Recent Labs  2013-04-18 0034 04/18/13 0835 03/24/13 0450  NA 135  --  134*  K 4.0  --  3.5  CL 105  --  104  GLUCOSE 119*  --  153*  BUN 20  --  7  CREATININE 0.81 0.66 0.61  CALCIUM 9.2  --  9.1  MG  --   --  1.8    Imaging: 03/24/13 KUB: IMPRESSION:  Persistent gaseous distention of small bowel loops though contrast  from CT exam of 2013/04/18 has passed into the colon, favor a  degree of small bowel ileus though partial small bowel obstruction  is not entirely excluded.  Scheduled Meds: . amitriptyline  50 mg Oral QHS  . amLODipine  5 mg Oral Daily  . anastrozole  1 mg Oral Daily  . aspirin  81 mg Oral Daily  . atorvastatin  40 mg Oral Daily  . heparin  5,000 Units Subcutaneous Q8H  . levothyroxine  112 mcg Oral Daily  . mometasone-formoterol  2 puff Inhalation BID  . pantoprazole  40 mg Oral BID  . sodium chloride  3 mL Intravenous Q12H  . sucralfate  1 g Oral TID WC & HS  . tiotropium  18 mcg Inhalation Daily   Continuous Infusions: . sodium chloride    . dextrose 5 % and 0.9% NaCl 75 mL/hr at 03/24/13 1120   PRN Meds:.albuterol, bisacodyl, LORazepam, ondansetron (ZOFRAN) IV, ondansetron, traMADol   Physical Exam: Filed Vitals:   03/24/13 1340  BP: 107/75  Pulse: 82  Temp: 98.4 F (36.9 C)  Resp: 18   Pulm - normal respirations, no wheezing Abd - distended, absent bowel sounds, moderate tenderness to palpation     Assessment/Plan: 1. Abdominal pain - no real change in symptoms or KUB.   Plan KUB in AM  AM Lab: CBC, Bmet  Add reglan 5 mg IV q8  Tramadol 50 mg q 8 for  breakthrough pain.   [name] [place] [facility] (o) [phone]; (c) [phone] Call-grp - [name] IM  Tele: [phone]  03/24/2013, 6:15 PM

## 2013-03-24 NOTE — Consult Note (Signed)
I have seen and examined the patient and agree with the assessment and plans. Patient currently eating.  No pain or tenderness when distracted CT scan shows no signs of ischemia or obstruction.   Would only operate for signs of ischemia. ? If Vascular surg has a role.  Ander Wamser A. Magnus Ivan  MD, FACS

## 2013-03-24 NOTE — Consult Note (Signed)
Reason for Consult: Abdominal Pain Referring Physician: Dr. Illene Regulus  [name] is an 67 y.o. female.  HPI: Pt admitted on 03/23/13 with abdominal pain.  She says pain started in the PM of 03/21/13, some nausea, and diffuse abdominal pain.  She had ongoing pain on 6/8, but was able to eat.  Nothing made it better or worse.  She was able to eat.  She has not had a BM for about 6 days (03/18/13).  She was admitted on 03/23/13 with ongoing pain, an elevated WBC 15K. Evaluation in the ER included a abdominal pelvic CT with contrast showed SMA and Celiac artery with occlusion, but with distal reconstitution, and no ischemic bowel changes. Her BP was also a bit low.  She had been taking pain pill every 4-6 hours and was found to be lethargic in the ER.  She continues to have pain, she reports pain medicine has not been effective so far.  She is able to take clears, they do not seem to make her feel better or worse.  She notes no improvement, nausea and vomiting are not issues currently.  She complains of pain.  No BM.  We are ask to see for abdominal pain.  She reports never having any pain like this before.   Past Medical History  Diagnosis Date  . COPD (chronic obstructive pulmonary disease) S/P VATs for RUL granuloma, benign pathology   . GERD (gastroesophageal reflux disease) Stable on PPI  . Hypertension   . Diverticulitis     required colcectomy  . Osteoporosis   . Hyperlipidemia   . Cancer 2010    invasive ductal breast cancer; on tamoxifen  . Fibromyalgia 1995  Say it hurts all over  . Hx of colonic polyps     Dr. Matthias Hughs -last study '11  . Peripheral vascular disease May '12 - CT angio    SMA chronic occlusion; stenosis of celiac trunk.  . Hypothyroidism   . Hearing loss of both ears     uses hearing aids  bilaterally  . Varicose veins of leg with swelling     varicose vein surgery - Dr. Guss Bunde  . Complication of anesthesia     was in ICU after lung surgery 2012  . Wears  glasses     Past Surgical History  Procedure Laterality Date  . Appendectomy    . Wedge resection  1993    right upper lobe of lung as part of VAT-nodule/benign  . Colonoscopy w/ polypectomy    . Abdominal hysterectomy  1973    metorrhagia  . Colectomy  2002    sigmoid  . Cardiac catheterization  '11    no obstructive coronary disease  . Breast surgery  2010    mastectomy with reconstructive surgery  . Breast surgery  2010    rt lump x2  . Breast surgery  2010    partial rt br reduction  . Lung surgery  9/12    rt mid lung wedge  . Partial mastectomy with needle localization Left 02/23/2013    Procedure:  LEFT PARTIAL MASTECTOMY WITH NEEDLE LOCALIZATION;  Surgeon: [name], MD;  Location: [facility];  Service: General;  Laterality: Left;    Family History  Problem Relation Age of Onset  . Colon cancer Mother     colon  . COPD Mother     brown lung  . Hypertension Mother   . Diabetes Mother   . Emphysema Mother   .  Coronary artery disease Father   . Heart attack Father   . Sudden death Father   . Heart disease Father   . Cancer Sister     fallopian tube  . Hypertension Sister   . Coronary artery disease Brother   . Throat cancer Sister   . Melanoma Sister   . Hypertension Sister   . Pancreatic cancer Sister   . Cancer Sister   . Heart attack Brother     early 30's    Social History:  reports that she quit smoking about a year ago. Her smoking use included Cigarettes. She smoked 0.75 packs per day. She has never used smokeless tobacco. She reports that she drinks about 6.0 ounces of alcohol per week. She reports that she does not use illicit drugs. Tobacco: 1 PPD for 52 years down to less than 1 PPD ETOH:  12 pack of beer per week Drugs:  None Lives with her husband, currently retired. Allergies:  Allergies  Allergen Reactions  . Sulfonamide Derivatives Swelling    Medications:  Prior to Admission:  Prescriptions prior to admission   Medication Sig Dispense Refill  . albuterol (PROVENTIL HFA;VENTOLIN HFA) 108 (90 BASE) MCG/ACT inhaler Inhale 2 puffs into the lungs every 6 (six) hours as needed for wheezing. As a RESCUE medication  1 Inhaler  2  . amitriptyline (ELAVIL) 25 MG tablet Take 50 mg by mouth at bedtime.      Marland Kitchen amLODipine (NORVASC) 10 MG tablet Take 10 mg by mouth daily.      Marland Kitchen anastrozole (ARIMIDEX) 1 MG tablet Take 1 mg by mouth daily.      Marland Kitchen aspirin 81 MG tablet Take 81 mg by mouth daily.       Marland Kitchen atorvastatin (LIPITOR) 40 MG tablet Take 40 mg by mouth daily.      . B Complex Vitamins (VITAMIN-B COMPLEX PO) Take 1 tablet by mouth daily.       . Calcium Carbonate-Vitamin D (CALCIUM + D PO) Take 600 mg by mouth daily.       . Fluticasone-Salmeterol (ADVAIR) 100-50 MCG/DOSE AEPB Inhale 1 puff into the lungs 2 (two) times daily. As a MAINTENANCE medication.  1 each  3  . HYDROcodone-acetaminophen (NORCO/VICODIN) 5-325 MG per tablet Take 1-2 tablets by mouth every 4 (four) hours as needed for pain.      Marland Kitchen levothyroxine (SYNTHROID, LEVOTHROID) 112 MCG tablet Take 1 tablet (112 mcg total) by mouth daily.  30 tablet  5  . LORazepam (ATIVAN) 2 MG tablet Take 2 mg by mouth every 8 (eight) hours as needed for anxiety.      . metoCLOPramide (REGLAN) 10 MG tablet Take 10 mg by mouth 4 (four) times daily. 30 minutes before each meal and at bedtime      . metoprolol tartrate (LOPRESSOR) 25 MG tablet Take 1 tablet (25 mg total) by mouth 2 (two) times daily.  60 tablet  2  . omeprazole (PRILOSEC) 40 MG capsule Take 40 mg by mouth 2 (two) times daily.      Marland Kitchen tiotropium (SPIRIVA) 18 MCG inhalation capsule Place 18 mcg into inhaler and inhale daily. As a MAINTENANCE medication       Scheduled: . amitriptyline  50 mg Oral QHS  . amLODipine  5 mg Oral Daily  . anastrozole  1 mg Oral Daily  . aspirin  81 mg Oral Daily  . atorvastatin  40 mg Oral Daily  . heparin  5,000 Units Subcutaneous Q8H  .  levothyroxine  112 mcg Oral Daily   . mometasone-formoterol  2 puff Inhalation BID  . pantoprazole  40 mg Oral BID  . sodium chloride  3 mL Intravenous Q12H  . sucralfate  1 g Oral TID WC & HS  . tiotropium  18 mcg Inhalation Daily   Continuous: . sodium chloride    . dextrose 5 % and 0.9% NaCl 75 mL/hr at 03/23/13 2148   ZOX:[id number], bisacodyl, LORazepam, ondansetron (ZOFRAN) IV, ondansetron, traMADol Anti-infectives   Start     Dose/Rate Route Frequency Ordered Stop   03/23/13 1900  cefTRIAXone (ROCEPHIN) 1 g in dextrose 5 % 50 mL IVPB     1 g 100 mL/hr over 30 Minutes Intravenous  Once 03/23/13 1828 03/23/13 2032      Results for orders placed during the hospital encounter of 03/23/13 (from the past 48 hour(s))  CBC WITH DIFFERENTIAL     Status: Abnormal   Collection Time    03/23/13 12:34 AM      Result Value Range   WBC 15.1 (*) 4.0 - 10.5 K/uL   RBC 4.38  3.87 - 5.11 MIL/uL   Hemoglobin 14.0  12.0 - 15.0 g/dL   HCT 81.1  91.4 - 78.2 %   MCV 88.4  78.0 - 100.0 fL   MCH 32.0  26.0 - 34.0 pg   MCHC 36.2 (*) 30.0 - 36.0 g/dL   RDW 95.6  21.3 - 08.6 %   Platelets 280  150 - 400 K/uL   Neutrophils Relative % 89 (*) 43 - 77 %   Neutro Abs 13.4 (*) 1.7 - 7.7 K/uL   Lymphocytes Relative 6 (*) 12 - 46 %   Lymphs Abs 0.9  0.7 - 4.0 K/uL   Monocytes Relative 5  3 - 12 %   Monocytes Absolute 0.7  0.1 - 1.0 K/uL   Eosinophils Relative 0  0 - 5 %   Eosinophils Absolute 0.1  0.0 - 0.7 K/uL   Basophils Relative 0  0 - 1 %   Basophils Absolute 0.0  0.0 - 0.1 K/uL  COMPREHENSIVE METABOLIC PANEL     Status: Abnormal   Collection Time    03/23/13 12:34 AM      Result Value Range   Sodium 135  135 - 145 mEq/L   Potassium 4.0  3.5 - 5.1 mEq/L   Chloride 105  96 - 112 mEq/L   CO2 22  19 - 32 mEq/L   Glucose, Bld 119 (*) 70 - 99 mg/dL   BUN 20  6 - 23 mg/dL   Creatinine, Ser 5.78  0.50 - 1.10 mg/dL   Calcium 9.2  8.4 - 46.9 mg/dL   Total Protein 6.7  6.0 - 8.3 g/dL   Albumin 3.5  3.5 - 5.2 g/dL   AST 24  0 -  37 U/L   ALT 20  0 - 35 U/L   Alkaline Phosphatase 76  39 - 117 U/L   Total Bilirubin 0.7  0.3 - 1.2 mg/dL   GFR calc non Af Amer 73 (*) >90 mL/min   GFR calc Af Amer 85 (*) >90 mL/min   Comment:            The eGFR has been calculated     using the CKD EPI equation.     This calculation has not been     validated in all clinical     situations.     eGFR's persistently     <  90 mL/min signify     possible Chronic Kidney Disease.  PROTIME-INR     Status: None   Collection Time    03/23/13 12:34 AM      Result Value Range   Prothrombin Time 12.9  11.6 - 15.2 seconds   INR 0.98  0.00 - 1.49  LIPASE, BLOOD     Status: None   Collection Time    03/23/13 12:34 AM      Result Value Range   Lipase 14  11 - 59 U/L  SAMPLE TO BLOOD BANK     Status: None   Collection Time    03/23/13 12:34 AM      Result Value Range   Blood Bank Specimen SAMPLE AVAILABLE FOR TESTING     Sample Expiration 03/24/2013    CG4 I-STAT (LACTIC ACID)     Status: None   Collection Time    03/23/13 12:42 AM      Result Value Range   Lactic Acid, Venous 1.32  0.5 - 2.2 mmol/L  URINALYSIS, ROUTINE W REFLEX MICROSCOPIC     Status: None   Collection Time    03/23/13  2:48 AM      Result Value Range   Color, Urine YELLOW  YELLOW   APPearance CLEAR  CLEAR   Specific Gravity, Urine 1.019  1.005 - 1.030   pH 6.0  5.0 - 8.0   Glucose, UA NEGATIVE  NEGATIVE mg/dL   Hgb urine dipstick NEGATIVE  NEGATIVE   Bilirubin Urine NEGATIVE  NEGATIVE   Ketones, ur NEGATIVE  NEGATIVE mg/dL   Protein, ur NEGATIVE  NEGATIVE mg/dL   Urobilinogen, UA 0.2  0.0 - 1.0 mg/dL   Nitrite NEGATIVE  NEGATIVE   Leukocytes, UA NEGATIVE  NEGATIVE   Comment: MICROSCOPIC NOT DONE ON URINES WITH NEGATIVE PROTEIN, BLOOD, LEUKOCYTES, NITRITE, OR GLUCOSE <1000 mg/dL.  CBC     Status: Abnormal   Collection Time    03/23/13  8:35 AM      Result Value Range   WBC 13.6 (*) 4.0 - 10.5 K/uL   RBC 3.89  3.87 - 5.11 MIL/uL   Hemoglobin 11.9 (*)  12.0 - 15.0 g/dL   HCT 40.9 (*) 81.1 - 91.4 %   MCV 91.8  78.0 - 100.0 fL   MCH 30.6  26.0 - 34.0 pg   MCHC 33.3  30.0 - 36.0 g/dL   RDW 78.2  95.6 - 21.3 %   Platelets 230  150 - 400 K/uL  CREATININE, SERUM     Status: Abnormal   Collection Time    03/23/13  8:35 AM      Result Value Range   Creatinine, Ser 0.66  0.50 - 1.10 mg/dL   GFR calc non Af Amer 89 (*) >90 mL/min   GFR calc Af Amer >90  >90 mL/min   Comment:            The eGFR has been calculated     using the CKD EPI equation.     This calculation has not been     validated in all clinical     situations.     eGFR's persistently     <90 mL/min signify     possible Chronic Kidney Disease.  TSH     Status: None   Collection Time    03/23/13  8:35 AM      Result Value Range   TSH 0.938  0.350 - 4.500 uIU/mL  CBC WITH DIFFERENTIAL  Status: Abnormal   Collection Time    03/24/13  4:50 AM      Result Value Range   WBC 11.0 (*) 4.0 - 10.5 K/uL   RBC 4.31  3.87 - 5.11 MIL/uL   Hemoglobin 13.1  12.0 - 15.0 g/dL   HCT 16.1  09.6 - 04.5 %   MCV 91.2  78.0 - 100.0 fL   MCH 30.4  26.0 - 34.0 pg   MCHC 33.3  30.0 - 36.0 g/dL   RDW 40.9  81.1 - 91.4 %   Platelets 259  150 - 400 K/uL   Neutrophils Relative % 85 (*) 43 - 77 %   Neutro Abs 9.4 (*) 1.7 - 7.7 K/uL   Lymphocytes Relative 8 (*) 12 - 46 %   Lymphs Abs 0.9  0.7 - 4.0 K/uL   Monocytes Relative 6  3 - 12 %   Monocytes Absolute 0.7  0.1 - 1.0 K/uL   Eosinophils Relative 0  0 - 5 %   Eosinophils Absolute 0.0  0.0 - 0.7 K/uL   Basophils Relative 0  0 - 1 %   Basophils Absolute 0.0  0.0 - 0.1 K/uL  BASIC METABOLIC PANEL     Status: Abnormal   Collection Time    03/24/13  4:50 AM      Result Value Range   Sodium 134 (*) 135 - 145 mEq/L   Potassium 3.5  3.5 - 5.1 mEq/L   Chloride 104  96 - 112 mEq/L   CO2 23  19 - 32 mEq/L   Glucose, Bld 153 (*) 70 - 99 mg/dL   BUN 7  6 - 23 mg/dL   Comment: DELTA CHECK NOTED   Creatinine, Ser 0.61  0.50 - 1.10 mg/dL    Calcium 9.1  8.4 - 78.2 mg/dL   GFR calc non Af Amer >90  >90 mL/min   GFR calc Af Amer >90  >90 mL/min   Comment:            The eGFR has been calculated     using the CKD EPI equation.     This calculation has not been     validated in all clinical     situations.     eGFR's persistently     <90 mL/min signify     possible Chronic Kidney Disease.    Dg Abd 2 Views  03/23/2013   *RADIOLOGY REPORT*  Clinical Data: Abdominal pain, bloating and diminished bowel sounds.  History of mesenteric arterial occlusive disease.  ABDOMEN - 2 VIEW  Comparison: CTA of the abdomen and pelvis on 03/23/2013.  Findings: Abdominal films show oral contrast transit to the level of the colon.  There is some air and mildly prominent small bowel loops with findings were suggestive of ileus rather than true small bowel obstruction.  There is no evidence of free air or abnormal calcifications.  Atelectasis visualized in the right lower lung.  IMPRESSION: Some prominent small bowel loops are present with oral contrast ingested for CT earlier today identified in the colon.  Findings are more suggestive of ileus rather than true small bowel obstruction.   Original Report Authenticated By: Irish Lack, M.D.   Ct Angio Abd/pel W/ And/or W/o  03/23/2013   *RADIOLOGY REPORT*  Clinical Data:  Severe diffuse abdominal pain.  History of SMA and celiac chronic occlusions.  Hypotension.  CT ANGIOGRAPHY ABDOMEN AND PELVIS  Technique:  Multidetector CT imaging of the abdomen and pelvis was performed  using the standard protocol during bolus administration of intravenous contrast.  Multiplanar reconstructed images including MIPs were obtained and reviewed to evaluate the vascular anatomy.  Contrast: OMNIPAQUE IOHEXOL 350 MG/ML SOLN  Comparison:  12/08/2010  Findings:  Mild reticular nodular interstitial changes in the lung bases suggest interstitial infiltration or edema.  Slight fibrosis and emphysematous changes present.  Small  esophageal hiatal hernia with contrast material in the lower esophagus suggesting reflux. Right breast implant.  Diffuse calcification of the abdominal aorta and branch vessels with normal caliber.  No aneurysm identified.  There is proximal occlusion of the celiac axis with prompt distal reconstitution of flow to the celiac axis.  There is proximal occlusion of the superior mesenteric artery with occlusion of approximately 3.3 cm segment and distal reconstitution subsequently.  The inferior mesenteric artery is patent and is prominent in appearance.  No evidence of aortic dissection.  There is calcification in the origin of the single renal arteries bilaterally but the nephrograms are symmetrical and there is no evidence for renal arterial occlusion.  The common iliac, external iliac, and common femoral arteries bilaterally are patent with diffuse calcification.  Lobular architecture of the spleen suggest either prior splenic infarct or previous resection of the spleen with hypertrophic splenules.  This appearance is stable.  The liver, gallbladder, pancreas, adrenal glands, kidneys, inferior vena cava, and retroperitoneal lymph nodes are unremarkable.  Stool filled colon without distension.  Small bowel is not distended.  No gastric or small bowel wall thickening identified.  No free air or free fluid in the abdomen.  Pelvis:  Uterus appears to be surgically absent.  No abnormal adnexal masses.  Bladder wall is not thickened.  Appendix is not demonstrated.  No evidence of diverticulitis.  Degenerative changes in the lumbar spine.   Review of the MIP images confirms the above findings.  IMPRESSION: Diffuse atherosclerotic disease in the abdominal aorta and branch vessels.  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  No CT changes suggesting bowel ischemia or infarct.  Small esophageal hiatal hernia with gastroesophageal reflux or dysmotility suggested. Lobular  architecture of the spleen.  Interstitial pattern in the lungs.   Original Report Authenticated By: Burman Nieves, M.D.    Review of Systems  Constitutional: Positive for fever (she said it was up to 101.3 on 03/21/13,  TM 100.2 here in hospital). Negative for chills, weight loss, malaise/fatigue and diaphoresis.  HENT:       Hearing aide  Eyes:       Wears glasses  Respiratory: Positive for cough (dry sounds chronic) and wheezing (She wheezes all the time). Negative for hemoptysis, sputum production and shortness of breath.   Cardiovascular: Positive for leg swelling (some in summer).  Gastrointestinal: Positive for heartburn (stable on PPI), nausea, vomiting, abdominal pain and constipation (last BM 03/18/13  says she averages about 3 BM's per week). Negative for diarrhea, blood in stool and melena.  Genitourinary: Negative.        Says she has some odor  Musculoskeletal:       Fibromyalgia, says she aches all over.  Skin: Negative.   Neurological: Positive for headaches. Negative for weakness.  Endo/Heme/Allergies: Negative.   Psychiatric/Behavioral: Negative.    Blood pressure 121/58, pulse 103, temperature 98.7 F (37.1 C), temperature source Oral, resp. rate 18, height 5\' 6"  (1.676 m), weight 72.2 kg (159 lb 2.8 oz), SpO2 92.00%. Physical Exam  Constitutional: She is oriented to person, place, and time.  She appears well-developed and well-nourished. She appears distressed (She complains of pain all over her abodmen, cannot sit up or roll to side without moaning and having significant discomfort).  Appears fairly comfortable while talking, but complains of extreme discomfort with any movement.  HENT:  Head: Normocephalic and atraumatic.  Nose: Nose normal.  Eyes: Conjunctivae and EOM are normal. Pupils are equal, round, and reactive to light. Right eye exhibits no discharge. Left eye exhibits no discharge. No scleral icterus.  Neck: Normal range of motion. Neck supple. No JVD  present. No tracheal deviation present. No thyromegaly present.  Cardiovascular: Regular rhythm and normal heart sounds.  Exam reveals no gallop.   No murmur heard. Tachycardic Left DP pulse I thought I found one on the right, but I'm not sure.  Respiratory: Effort normal. No respiratory distress. She has wheezes (Bilateral wheezing). She exhibits no tenderness.  She has had breast surgery  I could not get her to sit up or turn enough to evaluate the rest of the chest.  GI: She exhibits distension. She exhibits no mass. There is tenderness. There is guarding. There is no rebound.  Mid line abdominal incision, somewhat distended.  Pain with palpation or light touch all over abdomen with exam.  She didn't complain when I listened to her abdomen and pressed.  Musculoskeletal: She exhibits no edema and no tenderness.  Lymphadenopathy:    She has no cervical adenopathy.  Neurological: She is alert and oriented to person, place, and time. No cranial nerve deficit.  Skin: Skin is warm and dry. No rash noted. She is not diaphoretic. No erythema. No pallor.  Psychiatric: She has a normal mood and affect. Her behavior is normal. Judgment and thought content normal.    Assessment/Plan: 1.Abdominal pain of uncertain etiology 2. SMA and Celiac axis occlusion with distal recontstution. 3.Chronic pain and fibromyalgia with narcotic use 4.COPD with ongoing tobacco use.  1PPD starting at age 69, currently down to < 1 PPD    S/p  Right VAT's with wedge resection. (path was benign) 5. ETOH use 6.Breast Cancer with resection and implant on right,2010,  s/p Left partial mastectomy with needle localization and margin assessment, Benign 02/23/13. 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  Prior sigmoid colectomy for diverticulitis 11. Hyperlipidemia 12.Hypothyroid 13.  Bilateral hearing loss 14. Constipation  Plan:  I will discuss with Dr. Magnus Ivan.  I know he talked with Dr.  Debby Bud and discussed an NG.  I will repeat her films this AM and will see what it shows. Follow with you.   Lizmarie Witters 03/24/2013, 8:01 AM

## 2013-03-24 NOTE — Progress Notes (Signed)
Subjective: [name] reports persistent abdominal pain. NO chest pain, no breathing trouble. She has not passed gas or had a bowel movement.  Objective: Lab:  Recent Labs  03/23/13 0034 03/23/13 0835 03/24/13 0450  WBC 15.1* 13.6* 11.0*  NEUTROABS 13.4*  --  9.4*  HGB 14.0 11.9* 13.1  HCT 38.7 35.7* 39.3  MCV 88.4 91.8 91.2  PLT 280 230 259    Recent Labs  03/23/13 0034 03/23/13 0835 03/24/13 0450  NA 135  --  134*  K 4.0  --  3.5  CL 105  --  104  GLUCOSE 119*  --  153*  BUN 20  --  7  CREATININE 0.81 0.66 0.61  CALCIUM 9.2  --  9.1    Imaging: KUB 03/23/13: IMPRESSION:  Some prominent small bowel loops are present with oral contrast  ingested for CT earlier today identified in the colon. Findings  are more suggestive of ileus rather than true small bowel  obstruction.  Scheduled Meds: . amitriptyline  50 mg Oral QHS  . anastrozole  1 mg Oral Daily  . aspirin  81 mg Oral Daily  . atorvastatin  40 mg Oral Daily  . heparin  5,000 Units Subcutaneous Q8H  . levothyroxine  112 mcg Oral Daily  . mometasone-formoterol  2 puff Inhalation BID  . pantoprazole  40 mg Oral BID  . sodium chloride  3 mL Intravenous Q12H  . sucralfate  1 g Oral TID WC & HS  . tiotropium  18 mcg Inhalation Daily   Continuous Infusions: . sodium chloride    . dextrose 5 % and 0.9% NaCl 75 mL/hr at 03/23/13 2148   PRN Meds:.albuterol, LORazepam, ondansetron (ZOFRAN) IV, ondansetron, traMADol   Physical Exam: Filed Vitals:   03/24/13 0545  BP: 121/58  Pulse: 103  Temp: 98.7 F (37.1 C)  Resp: 18  Gen'l - overweight white woman in no acute distress HEENT- C&S clear, very dry mucus membranes Nodes - negative Cor- 2+ radial pulse. RRR, no murmurs Pulm - no increased WOB, feint end-expiratory wheeze L>R Abd - distended, hypoactive to absent BS, diffusely tender with some guarding, no rebound Neuro - somnolent (fell asleep during exam)      Assessment/Plan: 1. Abdominal pain  - ileus over obstruction. Pain x 3 days. Has h/o colectomy for diverticulitis by Dr. Orpah Greek. At risk for adhesions. Doubt infection.  Plan Continue clear liquids only  Minimize narcotics  GS consult  2. Leukocytosis - improved. No fever. No clear focus of infection.  Plan F/u CBC  3. Cardiac - stable  4. HTN - BP coming up.  Plan - restart amlodipine  5. Pulm - stable.   [name] [facility] (o) [phone]; (c) [phone] Call-grp - [facility]  Tele: [phone]  03/24/2013, 6:52 AM

## 2013-03-25 ENCOUNTER — Observation Stay (HOSPITAL_COMMUNITY): Payer: Medicare Other

## 2013-03-25 ENCOUNTER — Telehealth: Payer: Self-pay

## 2013-03-25 LAB — CBC WITH DIFFERENTIAL/PLATELET
Basophils Absolute: 0 10*3/uL (ref 0.0–0.1)
Basophils Relative: 0 % (ref 0–1)
Eosinophils Absolute: 0.2 10*3/uL (ref 0.0–0.7)
Eosinophils Relative: 2 % (ref 0–5)
HCT: 37.8 % (ref 36.0–46.0)
Hemoglobin: 12.7 g/dL (ref 12.0–15.0)
Lymphocytes Relative: 8 % — ABNORMAL LOW (ref 12–46)
Lymphs Abs: 0.7 10*3/uL (ref 0.7–4.0)
MCH: 30.4 pg (ref 26.0–34.0)
MCHC: 33.6 g/dL (ref 30.0–36.0)
MCV: 90.4 fL (ref 78.0–100.0)
Monocytes Absolute: 0.9 10*3/uL (ref 0.1–1.0)
Monocytes Relative: 10 % (ref 3–12)
Neutro Abs: 7 10*3/uL (ref 1.7–7.7)
Neutrophils Relative %: 79 % — ABNORMAL HIGH (ref 43–77)
Platelets: 270 10*3/uL (ref 150–400)
RBC: 4.18 MIL/uL (ref 3.87–5.11)
RDW: 14.7 % (ref 11.5–15.5)
WBC: 8.8 10*3/uL (ref 4.0–10.5)

## 2013-03-25 LAB — BASIC METABOLIC PANEL
BUN: 5 mg/dL — ABNORMAL LOW (ref 6–23)
CO2: 24 mEq/L (ref 19–32)
Calcium: 9.2 mg/dL (ref 8.4–10.5)
Chloride: 103 mEq/L (ref 96–112)
Creatinine, Ser: 0.5 mg/dL (ref 0.50–1.10)
GFR calc Af Amer: >90 mL/min (ref >90)
GFR calc non Af Amer: >90 mL/min (ref >90)
Glucose, Bld: 134 mg/dL — ABNORMAL HIGH (ref 70–99)
Potassium: 3.7 mEq/L (ref 3.5–5.1)
Sodium: 136 mEq/L (ref 135–145)

## 2013-03-25 MED ORDER — FLEET ENEMA 7-19 GM/118ML RE ENEM
1.0000 | ENEMA | Freq: Once | RECTAL | Status: AC
  Administered 2013-03-25: 1 via RECTAL
  Filled 2013-03-25: qty 1

## 2013-03-25 MED ORDER — BIOTENE DRY MOUTH MT LIQD
15.0000 mL | Freq: Two times a day (BID) | OROMUCOSAL | Status: DC
  Administered 2013-03-25 – 2013-03-29 (×9): 15 mL via OROMUCOSAL

## 2013-03-25 MED ORDER — CHLORHEXIDINE GLUCONATE 0.12 % MT SOLN
15.0000 mL | Freq: Two times a day (BID) | OROMUCOSAL | Status: DC
  Administered 2013-03-25: 15 mL via OROMUCOSAL
  Filled 2013-03-25 (×3): qty 15

## 2013-03-25 NOTE — Care Management Note (Signed)
MD, pt meets INPT criteria, can we please get an order for INPT status? Thank you,  Johny Shock RN MPH Case Manager [phone]

## 2013-03-25 NOTE — Telephone Encounter (Signed)
Phone call from [place], Sports coach at [employer]. She states pt was admitted for observation but her status needs to be changes to inpatient.

## 2013-03-25 NOTE — Progress Notes (Signed)
Subjective: Still with abdominal pain No flatus  Objective: Vital signs in last 24 hours: Temp:  [97.6 F (36.4 C)-98.7 F (37.1 C)] 98.7 F (37.1 C) 27-Mar-2023 0446) Pulse Rate:  [82-102] 99 03-27-2023 0753) Resp:  [18-20] 20 Mar 27, 2023 0446) BP: (107-146)/(55-82) 146/82 mmHg March 27, 2023 0753) SpO2:  [90 %-96 %] 94 % 2023-03-27 0753) Weight:  [161 lb 9.6 oz (73.3 kg)] 161 lb 9.6 oz (73.3 kg) (06/10 2040) Last BM Date: 03/24/13  Intake/Output from previous day: 06/10 0701 - 03-27-23 0700 In: 1233.8 [P.O.:360; I.V.:873.8] Out: 550 [Urine:550] Intake/Output this shift:    Distended on exam, but minimally tender  Lab Results:   Recent Labs  03/24/13 0450 03-26-13 0440  WBC 11.0* 8.8  HGB 13.1 12.7  HCT 39.3 37.8  PLT 259 270   BMET  Recent Labs  03/24/13 0450 26-Mar-2013 0440  NA 134* 136  K 3.5 3.7  CL 104 103  CO2 23 24  GLUCOSE 153* 134*  BUN 7 5*  CREATININE 0.61 0.50  CALCIUM 9.1 9.2   PT/INR  Recent Labs  03-24-2013 0034  LABPROT 12.9  INR 0.98   ABG No results found for this basename: PHART, PCO2, PO2, HCO3,  in the last 72 hours  Studies/Results: Dg Abd 2 Views  03-26-2013   *RADIOLOGY REPORT*  Clinical Data: Abdominal pain and distention, follow up ileus versus partial small bowel obstruction  ABDOMEN - 2 VIEW  Comparison: 03/24/2013  Findings: Increased small bowel distention versus previous exam. No definite bowel wall thickening or free intraperitoneal air. Residual retained contrast in colon. Bones demineralized. Mild distention of urinary bladder. No urinary tract calcification.  IMPRESSION: Increased small bowel distention versus previous exam favor small bowel obstruction over ileus.   Original Report Authenticated By: Ulyses Southward, M.D.   Dg Abd 2 Views  03/24/2013   *RADIOLOGY REPORT*  Clinical Data: Nonspecific abdominal pain, nausea, vomiting, history GERD, hypertension, COPD  ABDOMEN - 2 VIEW  Comparison: Mar 24, 2013  Findings: Retained contrast in colon.  Gaseous distention of small bowel loops in the abdomen persists, may represent ileus though partial small bowel obstruction is not excluded. No definite bowel wall thickening or free intraperitoneal air. Bones demineralized. Bibasilar lung opacities are noted.  IMPRESSION: Persistent gaseous distention of small bowel loops though contrast from CT exam of 03/24/2013 has passed into the colon, favor a degree of small bowel ileus though partial small bowel obstruction is not entirely excluded.   Original Report Authenticated By: Ulyses Southward, M.D.   Dg Abd 2 Views  03-24-2013   *RADIOLOGY REPORT*  Clinical Data: Abdominal pain, bloating and diminished bowel sounds.  History of mesenteric arterial occlusive disease.  ABDOMEN - 2 VIEW  Comparison: CTA of the abdomen and pelvis on 03-24-2013.  Findings: Abdominal films show oral contrast transit to the level of the colon.  There is some air and mildly prominent small bowel loops with findings were suggestive of ileus rather than true small bowel obstruction.  There is no evidence of free air or abnormal calcifications.  Atelectasis visualized in the right lower lung.  IMPRESSION: Some prominent small bowel loops are present with oral contrast ingested for CT earlier today identified in the colon.  Findings are more suggestive of ileus rather than true small bowel obstruction.   Original Report Authenticated By: Irish Lack, M.D.    Anti-infectives: Anti-infectives   Start     Dose/Rate Route Frequency Ordered Stop   March 24, 2013 1900  cefTRIAXone (ROCEPHIN) 1 g in dextrose 5 %  50 mL IVPB     1 g 100 mL/hr over 30 Minutes Intravenous  Once 03/23/13 1828 03/23/13 2032      Assessment/Plan:  pSBO vs ileus  Contrast is making it into colon.  No indications currently for surgery.  Will need enemas to clear contrast out of colon and then a small bowel follow through(SBFT) to see if there really is obstruction.  LOS: 2 days    Rekisha Welling A 03/25/2013

## 2013-03-25 NOTE — Progress Notes (Signed)
Subjective: [name] has continuing pain that has not improved. Denies N/V. She has no wheezing but O2 sats drop to high 80's when she changes position or is in pain.   Objective: Lab:  Recent Labs  03/23/13 0034 03/23/13 0835 03/24/13 0450 03/25/13 0440  WBC 15.1* 13.6* 11.0* 8.8  NEUTROABS 13.4*  --  9.4* 7.0  HGB 14.0 11.9* 13.1 12.7  HCT 38.7 35.7* 39.3 37.8  MCV 88.4 91.8 91.2 90.4  PLT 280 230 259 270    Recent Labs  03/23/13 0034 03/23/13 0835 03/24/13 0450 03/25/13 0440  NA 135  --  134* 136  K 4.0  --  3.5 3.7  CL 105  --  104 103  GLUCOSE 119*  --  153* 134*  BUN 20  --  7 5*  CREATININE 0.81 0.66 0.61 0.50  CALCIUM 9.2  --  9.1 9.2  MG  --   --  1.8  --     Imaging: KUB - preliminary reading - persistent dilation of small loops of bowel w/o change from 6/10 Scheduled Meds: . amitriptyline  50 mg Oral QHS  . amLODipine  5 mg Oral Daily  . anastrozole  1 mg Oral Daily  . aspirin  81 mg Oral Daily  . atorvastatin  40 mg Oral Daily  . heparin  5,000 Units Subcutaneous Q8H  . levothyroxine  112 mcg Oral Daily  . metoCLOPramide (REGLAN) injection  5 mg Intravenous Q8H  . mometasone-formoterol  2 puff Inhalation BID  . pantoprazole  40 mg Oral BID  . sodium chloride  3 mL Intravenous Q12H  . sucralfate  1 g Oral TID WC & HS  . tiotropium  18 mcg Inhalation Daily   Continuous Infusions: . sodium chloride    . dextrose 5 % and 0.9% NaCl 75 mL/hr at 03/25/13 0549   PRN Meds:.albuterol, bisacodyl, LORazepam, ondansetron (ZOFRAN) IV, ondansetron, traMADol   Physical Exam: Filed Vitals:   03/25/13 0446  BP: 123/72  Pulse: 96  Temp: 98.7 F (37.1 C)  Resp: 20   Gen'l - WNWD white woman in no acute distress HEENT-- C&S clear Cor- 2+ radial, RRR PUlm - no increased WOB, no wheezing Abd - distended, minimal to absent BS x 4, diffusely tender, guarding. Neuro - non-focal, A&O      Assessment/Plan: 1. Abdominal pain - no change in symptoms.  KUB with persistent distention of small bowel. Not nauseated. She has had small BM with dulcolax.  Plan Continue present regimen including reglan  2. Leukocytosis - resolved  3. Cardiac - stable  4. HTN - BP is better. Continue present meds and hold metoprolol  5. Pulm - Sats are dropping a little but she has no respiratory distress.    [name] [facility] (o) [phone]; (c) [phone] Call-grp - [facility]  Tele: [phone]  03/25/2013, 7:25 AM

## 2013-03-26 ENCOUNTER — Inpatient Hospital Stay (HOSPITAL_COMMUNITY): Payer: Medicare Other

## 2013-03-26 MED ORDER — WHITE PETROLATUM GEL
Status: AC
  Filled 2013-03-26: qty 5

## 2013-03-26 MED ORDER — FLEET ENEMA 7-19 GM/118ML RE ENEM
1.0000 | ENEMA | Freq: Once | RECTAL | Status: AC
  Administered 2013-03-26: 1 via RECTAL
  Filled 2013-03-26: qty 1

## 2013-03-26 NOTE — Progress Notes (Signed)
Subjective: Mrs. Sires says she feels much better. The pain is pretty much gone. She has had flatus.  Objective: Lab:  Recent Labs  03/23/13 0835 03/24/13 0450 03/25/13 0440  WBC 13.6* 11.0* 8.8  NEUTROABS  --  9.4* 7.0  HGB 11.9* 13.1 12.7  HCT 35.7* 39.3 37.8  MCV 91.8 91.2 90.4  PLT 230 259 270    Recent Labs  03/23/13 0835 03/24/13 0450 03/25/13 0440  NA  --  134* 136  K  --  3.5 3.7  CL  --  104 103  GLUCOSE  --  153* 134*  BUN  --  7 5*  CREATININE 0.66 0.61 0.50  CALCIUM  --  9.1 9.2  MG  --  1.8  --     Imaging: KUB pending  Scheduled Meds: . amitriptyline  50 mg Oral QHS  . amLODipine  5 mg Oral Daily  . anastrozole  1 mg Oral Daily  . antiseptic oral rinse  15 mL Mouth Rinse q12n4p  . aspirin  81 mg Oral Daily  . atorvastatin  40 mg Oral Daily  . heparin  5,000 Units Subcutaneous Q8H  . levothyroxine  112 mcg Oral Daily  . metoCLOPramide (REGLAN) injection  5 mg Intravenous Q8H  . mometasone-formoterol  2 puff Inhalation BID  . pantoprazole  40 mg Oral BID  . sodium chloride  3 mL Intravenous Q12H  . sucralfate  1 g Oral TID WC & HS  . tiotropium  18 mcg Inhalation Daily   Continuous Infusions: . sodium chloride 75 mL/hr at 03/26/13 0428  . dextrose 5 % and 0.9% NaCl Stopped (03/25/13 1548)   PRN Meds:.albuterol, bisacodyl, LORazepam, ondansetron (ZOFRAN) IV, ondansetron, traMADol   Physical Exam: Filed Vitals:   03/26/13 0537  BP: 112/83  Pulse: 104  Temp: 98.3 F (36.8 C)  Resp: 18   easiily awakened - no distress Cor- RRR Pulm - no wheezing Abd - distended, scatterd rare BS, no guarding     Assessment/Plan: 1. Abdominal pain - decreased pain, beginning to have BS.  Plan  continue present regimen  2-5) stable   Illene Regulus [facility] (o) [phone]; (c) [phone] Call-grp - [facility]  Tele: [phone]  03/26/2013, 6:30 AM

## 2013-03-26 NOTE — Progress Notes (Signed)
Subjective: She is still having pain, but less. Passing flatus Not much results with enema  Objective: Vital signs in last 24 hours: Temp:  [97.8 F (36.6 C)-98.4 F (36.9 C)] 98.3 F (36.8 C) (06/12 0537) Pulse Rate:  [94-104] 104 (06/12 0537) Resp:  [18-20] 18 (06/12 0537) BP: (112-154)/(62-83) 112/83 mmHg (06/12 0537) SpO2:  [90 %-96 %] 90 % (06/12 0758) Weight:  [159 lb 11.2 oz (72.439 kg)] 159 lb 11.2 oz (72.439 kg) (06/11 2119) Last BM Date: 03/24/13  Intake/Output from previous day: 06/11 0701 - 06/12 0700 In: 2295 [P.O.:480; I.V.:1815] Out: 2700 [Urine:2700] Intake/Output this shift:    Abdomen distended, minimally tender  Lab Results:   Recent Labs  03/24/13 0450 03/25/13 0440  WBC 11.0* 8.8  HGB 13.1 12.7  HCT 39.3 37.8  PLT 259 270   BMET  Recent Labs  03/24/13 0450 03/25/13 0440  NA 134* 136  K 3.5 3.7  CL 104 103  CO2 23 24  GLUCOSE 153* 134*  BUN 7 5*  CREATININE 0.61 0.50  CALCIUM 9.1 9.2   PT/INR No results found for this basename: LABPROT, INR,  in the last 72 hours ABG No results found for this basename: PHART, PCO2, PO2, HCO3,  in the last 72 hours  Studies/Results: Dg Abd 1 View  03/26/2013   *RADIOLOGY REPORT*  Clinical Data: Follow up distension  ABDOMEN - 1 VIEW  Comparison: 03/25/2013  Findings: Gaseous distension of multiple loops of small and large bowel, suggesting adynamic ileus.  Small bowel obstruction is considered unlikely given the non- decompressed appearance of the colon.  Residual contrast in the right colon.  IMPRESSION: Gaseous distension of small and large bowel, suggesting adynamic ileus.   Original Report Authenticated By: Charline Bills, M.D.   Dg Abd 2 Views  03/25/2013   *RADIOLOGY REPORT*  Clinical Data: Abdominal pain and distention, follow up ileus versus partial small bowel obstruction  ABDOMEN - 2 VIEW  Comparison: 03/24/2013  Findings: Increased small bowel distention versus previous exam. No  definite bowel wall thickening or free intraperitoneal air. Residual retained contrast in colon. Bones demineralized. Mild distention of urinary bladder. No urinary tract calcification.  IMPRESSION: Increased small bowel distention versus previous exam favor small bowel obstruction over ileus.   Original Report Authenticated By: Ulyses Southward, M.D.   Dg Abd 2 Views  03/24/2013   *RADIOLOGY REPORT*  Clinical Data: Nonspecific abdominal pain, nausea, vomiting, history GERD, hypertension, COPD  ABDOMEN - 2 VIEW  Comparison: 2013/04/03  Findings: Retained contrast in colon. Gaseous distention of small bowel loops in the abdomen persists, may represent ileus though partial small bowel obstruction is not excluded. No definite bowel wall thickening or free intraperitoneal air. Bones demineralized. Bibasilar lung opacities are noted.  IMPRESSION: Persistent gaseous distention of small bowel loops though contrast from CT exam of 2013-04-03 has passed into the colon, favor a degree of small bowel ileus though partial small bowel obstruction is not entirely excluded.   Original Report Authenticated By: Ulyses Southward, M.D.    Anti-infectives: Anti-infectives   Start     Dose/Rate Route Frequency Ordered Stop   Apr 03, 2013 1900  cefTRIAXone (ROCEPHIN) 1 g in dextrose 5 % 50 mL IVPB     1 g 100 mL/hr over 30 Minutes Intravenous  Once 04-03-2013 1828 04-03-13 2032      Assessment/Plan: s/p * No surgery found *  SBO vs ileus  KUB still shows moderate dilated loops of small bowel.  Contrast remains in right colon  but there is more air distally.  Can not do a SBFT with contrast still in the colon.  Will repeat enema.  LOS: 3 days    Parmvir Boomer A 03/26/2013

## 2013-03-27 ENCOUNTER — Other Ambulatory Visit: Payer: Self-pay | Admitting: Oncology

## 2013-03-27 ENCOUNTER — Inpatient Hospital Stay (HOSPITAL_COMMUNITY): Payer: Medicare Other

## 2013-03-27 DIAGNOSIS — C50919 Malignant neoplasm of unspecified site of unspecified female breast: Secondary | ICD-10-CM

## 2013-03-27 NOTE — Progress Notes (Signed)
Subjective: [name] appears comfortable. She admits the pain is less. Has passed gas but no BM  Objective: Lab:  Recent Labs  03/25/13 0440  WBC 8.8  NEUTROABS 7.0  HGB 12.7  HCT 37.8  MCV 90.4  PLT 270    Recent Labs  03/25/13 0440  NA 136  K 3.7  CL 103  GLUCOSE 134*  BUN 5*  CREATININE 0.50  CALCIUM 9.2    Imaging: 03/27/13: KUB IMPRESSION:  Similar bowel gas pattern, but mid abdominal small bowel loops are  less distended. Small volume CT type oral contrast remains, mostly  in the right colon. As before, the gas pattern is more compatible  with ileus than with mechanical obstruction.  Scheduled Meds: . amitriptyline  50 mg Oral QHS  . amLODipine  5 mg Oral Daily  . anastrozole  1 mg Oral Daily  . antiseptic oral rinse  15 mL Mouth Rinse q12n4p  . aspirin  81 mg Oral Daily  . atorvastatin  40 mg Oral Daily  . heparin  5,000 Units Subcutaneous Q8H  . levothyroxine  112 mcg Oral Daily  . metoCLOPramide (REGLAN) injection  5 mg Intravenous Q8H  . mometasone-formoterol  2 puff Inhalation BID  . pantoprazole  40 mg Oral BID  . sodium chloride  3 mL Intravenous Q12H  . sucralfate  1 g Oral TID WC & HS  . tiotropium  18 mcg Inhalation Daily   Continuous Infusions: . sodium chloride 75 mL/hr at 03/26/13 2139  . dextrose 5 % and 0.9% NaCl Stopped (03/25/13 1548)   PRN Meds:.albuterol, bisacodyl, LORazepam, ondansetron (ZOFRAN) IV, ondansetron, traMADol   Physical Exam: Filed Vitals:   03/27/13 0926  BP: 143/83  Pulse: 101  Temp: 98.3 F (36.8 C)  Resp: 20  Gen'l - WNWD white woman in no acute distress HEENT- C&S w/o icterus Cor- RRR PUlm - no increased WOB, no wheezing (just had MDI) Abd - absent to scattered hypoactive BS, distended, firm, no guarding or rebound, minimal tenderness Neuro - A&O x 3      Assessment/Plan: 1. ABdominal pain - persistent ileus by exam and x-ray. Pain is better Plan- minimize narcotics  Continue present  regimen  2. Leukocytosis - Plan F/u CBC  3. Cardiac - stable  4. HTN - approaching normal readings. Plan Continue present meds; will restart BB if BP remains up  5. PUlm - Stable  6. F/E/N - 1 week w/o nutrition Plan AM lab - prealbumin, cmet  May need to consider parenteral nutrition   Illene Regulus [place] [facility] (o) [phone]; (c) [phone] Call-grp - [facility]  Tele: [phone]  03/27/2013, 10:13 AM

## 2013-03-27 NOTE — Progress Notes (Signed)
  Subjective: Pt doing a little better.  Had abdominal pain yesterday afternoon with an episode of emesis.  She had a BM after the dulcolax suppository, but none with enema.    Objective: Vital signs in last 24 hours: Temp:  [98.3 F (36.8 C)-98.9 F (37.2 C)] 98.3 F (36.8 C) (06/13 0926) Pulse Rate:  [82-107] 101 (06/13 0926) Resp:  [18-20] 20 (06/13 0926) BP: (118-161)/(70-90) 143/83 mmHg (06/13 0926) SpO2:  [89 %-97 %] 97 % (06/13 1007) Weight:  [160 lb (72.576 kg)] 160 lb (72.576 kg) (06/13 0437) Last BM Date: 03/24/13  Intake/Output from previous day: 06/12 0701 - 06/13 0700 In: 2160 [P.O.:360; I.V.:1800] Out: 1300 [Urine:1300] Intake/Output this shift: Total I/O In: 240 [P.O.:240] Out: 500 [Urine:500]  Physical Exam General: alert, awake and oriented.  NAD.  VSS. Cardio: S1S2 RRR no murmurs, gallops or rubs.  No edema.  +2 distal pulses. Lungs: inspiratory wheezes scattered throughout. Abdomen: round, distended and diffusely tender.  Hypoactive bowel sounds.  No masses or hernias.  No organomegaly.  Lab Results:   Recent Labs  03/25/13 0440  WBC 8.8  HGB 12.7  HCT 37.8  PLT 270   BMET  Recent Labs  03/25/13 0440  NA 136  K 3.7  CL 103  CO2 24  GLUCOSE 134*  BUN 5*  CREATININE 0.50  CALCIUM 9.2   Studies/Results: Dg Abd 1 View  03/27/2013   *RADIOLOGY REPORT*  Clinical Data: 67 year old female abdominal pain and distention. Residual CT bowel contrast.  ABDOMEN - 1 VIEW  Comparison: None.  Findings: Largely stable bowel gas pattern.  Small volume of retained CT contrast remains mostly in the right colon (small volume in the proximal transverse colon.  Redundant transverse colon again noted. Central small bowel is less distended.  Gas filled small bowel loops measure up to 43 mm (previously 55 mm). Gas in the rectum as before. Stable visualized osseous structures.  IMPRESSION:  Similar bowel gas pattern, but mid abdominal small bowel loops are less  distended.  Small volume CT type oral contrast remains, mostly in the right colon. As before, the gas pattern is more compatible with ileus than with mechanical obstruction.   Original Report Authenticated By: Erskine Speed, M.D.   Dg Abd 1 View  03/26/2013   *RADIOLOGY REPORT*  Clinical Data: Follow up distension  ABDOMEN - 1 VIEW  Comparison: 03/25/2013  Findings: Gaseous distension of multiple loops of small and large bowel, suggesting adynamic ileus.  Small bowel obstruction is considered unlikely given the non- decompressed appearance of the colon.  Residual contrast in the right colon.  IMPRESSION: Gaseous distension of small and large bowel, suggesting adynamic ileus.   Original Report Authenticated By: Charline Bills, M.D.    Anti-infectives: Anti-infectives   Start     Dose/Rate Route Frequency Ordered Stop   03/23/13 1900  cefTRIAXone (ROCEPHIN) 1 g in dextrose 5 % 50 mL IVPB     1 g 100 mL/hr over 30 Minutes Intravenous  Once 03/23/13 1828 03/23/13 2032      Assessment/Plan: SBO v. Ileus -more consistent with an ileus rather than an obstruction.  No improvement on XR today. -Continue with current management as there is no indication for surgical intervention at this time. -Encourage ambulation, walk in hallways as tolerated with assist. -IS -SCDs, lovenox -Minimize pain medication use    LOS: 4 days    Bonner Puna [facility] ANP-BC Pager [phone]  03/27/2013 10:33 AM

## 2013-03-27 NOTE — Evaluation (Signed)
Physical Therapy Evaluation Patient Details Name: [name] MRN: [id number] DOB: 1946-05-31 Today's Date: 03/27/2013 Time: 4098-1191 PT Time Calculation (min): 21 min  PT Assessment / Plan / Recommendation Clinical Impression  67 yo female admitted with abdominal pain and found to have adynamic ileus. Pt is also having difficulty maintaining O2 saturation > 90 without supplemental O2.  Pt will benefit from acute PT in addition to continued ambulation with nursing prn.    PT Assessment  Patient needs continued PT services    Follow Up Recommendations  No PT follow up    Does the patient have the potential to tolerate intense rehabilitation      Barriers to Discharge        Equipment Recommendations  None recommended by PT    Recommendations for Other Services     Frequency Min 3X/week    Precautions / Restrictions     Pertinent Vitals/Pain Pt c/o abdominal pain      Mobility  Transfers Details for Transfer Assistance: Pt standing up in room upon my entry Ambulation/Gait Ambulation/Gait Assistance: 5: Supervision Ambulation Distance (Feet): 100 Feet Assistive device: None Ambulation/Gait Assistance Details: pt  needed cues to slow down, stand tall, swing arms to try to activate abdominal muscles Gait Pattern: Decreased step length - right;Decreased step length - left;Trunk flexed Gait velocity: pt tends to walk at a quick pace, which tends to cause O2 desaturation General Gait Details: pt walks fast with posture to protect painful abdomen. With cues she was able to slow down, relax and acheive a more relaxed, upright posture.  Nasal O2 at 3 l maintained, ut pt tended to desat with activity Stairs: No Wheelchair Mobility Wheelchair Mobility: No    Exercises General Exercises - Lower Extremity Gluteal Sets: AROM;Both;10 reps;Standing Hip Flexion/Marching: AROM;Both;Seated;10 reps Other Exercises Other Exercises: attempted abd sets, deep breathing, but pt had  difficulty activating abds.    PT Diagnosis: Generalized weakness;Acute pain;Difficulty walking  PT Problem List: Decreased strength;Decreased activity tolerance;Pain PT Treatment Interventions:     PT Goals Acute Rehab PT Goals PT Goal Formulation: With patient Time For Goal Achievement: 04/10/13 Potential to Achieve Goals: Good Pt will Ambulate: >150 feet;Independently;Other (comment) (with O2 sat > 90%) PT Goal: Ambulate - Progress: Goal set today Pt will Go Up / Down Stairs: 3-5 stairs;with least restrictive assistive device;with modified independence PT Goal: Up/Down Stairs - Progress: Goal set today  Visit Information  Last PT Received On: 03/27/13 Assistance Needed: +1    Subjective Data  Subjective: "Do you know what is wrong with my breathing?" Patient Stated Goal: to get back to  normal   Prior Functioning  Prior Function Level of Independence: Independent Communication Communication: No difficulties    Cognition  Cognition Arousal/Alertness: Awake/alert Behavior During Therapy: WFL for tasks assessed/performed Overall Cognitive Status: Within Functional Limits for tasks assessed    Extremity/Trunk Assessment Right Lower Extremity Assessment RLE ROM/Strength/Tone: Within functional levels Left Lower Extremity Assessment LLE ROM/Strength/Tone: Within functional levels Trunk Assessment Trunk Assessment: Other exceptions Trunk Exceptions: pt with rounded abdomen, unable to activate abdominal muscles due to pain   Balance Balance Balance Assessed: No  End of Session PT - End of Session Equipment Utilized During Treatment: Oxygen Activity Tolerance: Patient tolerated treatment well Patient left: in bed (sitting on EOB)  GP   Bayard Hugger. Brady,  [phone]  03/27/2013, 2:34 PM

## 2013-03-27 NOTE — Progress Notes (Signed)
I have seen and examined the patient and agree with the assessment and plans. Patient is slowly improving.  Call us back if things change.  Would need a SBFT for evaluation.  Stefhanie Kachmar A. Magnus Ivan  MD, FACS

## 2013-03-27 NOTE — Progress Notes (Signed)
Patient ambulated complete hall [phone]) down & back with RN at side.  Patient tolerated well. Steele Berg RN

## 2013-03-28 ENCOUNTER — Inpatient Hospital Stay (HOSPITAL_COMMUNITY): Payer: Medicare Other

## 2013-03-28 LAB — CBC
HCT: 36.3 % (ref 36.0–46.0)
Hemoglobin: 12.5 g/dL (ref 12.0–15.0)
MCH: 30.4 pg (ref 26.0–34.0)
MCHC: 34.4 g/dL (ref 30.0–36.0)
MCV: 88.3 fL (ref 78.0–100.0)
Platelets: 343 10*3/uL (ref 150–400)
RBC: 4.11 MIL/uL (ref 3.87–5.11)
RDW: 14 % (ref 11.5–15.5)
WBC: 9 10*3/uL (ref 4.0–10.5)

## 2013-03-28 LAB — COMPREHENSIVE METABOLIC PANEL
ALT: 12 U/L (ref 0–35)
AST: 14 U/L (ref 0–37)
Albumin: 2.8 g/dL — ABNORMAL LOW (ref 3.5–5.2)
Alkaline Phosphatase: 74 U/L (ref 39–117)
BUN: 5 mg/dL — ABNORMAL LOW (ref 6–23)
CO2: 27 mEq/L (ref 19–32)
Calcium: 8.5 mg/dL (ref 8.4–10.5)
Chloride: 99 mEq/L (ref 96–112)
Creatinine, Ser: 0.58 mg/dL (ref 0.50–1.10)
GFR calc Af Amer: >90 mL/min (ref >90)
GFR calc non Af Amer: >90 mL/min (ref >90)
Glucose, Bld: 91 mg/dL (ref 70–99)
Potassium: 3.3 mEq/L — ABNORMAL LOW (ref 3.5–5.1)
Sodium: 136 mEq/L (ref 135–145)
Total Bilirubin: 0.3 mg/dL (ref 0.3–1.2)
Total Protein: 6.2 g/dL (ref 6.0–8.3)

## 2013-03-28 LAB — PREALBUMIN: Prealbumin: 8.6 mg/dL — ABNORMAL LOW (ref 17.0–34.0)

## 2013-03-28 MED ORDER — METOPROLOL TARTRATE 25 MG PO TABS
25.0000 mg | ORAL_TABLET | Freq: Two times a day (BID) | ORAL | Status: DC
  Administered 2013-03-28 – 2013-03-29 (×4): 25 mg via ORAL
  Filled 2013-03-28 (×6): qty 1

## 2013-03-28 NOTE — Progress Notes (Signed)
Subjective: [name] is feeling bertter - no pain, has passed gas. No SOB  Objective: Lab:  Recent Labs  03/28/13 0430  WBC 9.0  HGB 12.5  HCT 36.3  MCV 88.3  PLT 343    Recent Labs  03/28/13 0430  NA 136  K 3.3*  CL 99  GLUCOSE 91  BUN 5*  CREATININE 0.58  CALCIUM 8.5   Recent Results (from the past 2160 hour(s))  CBC WITH DIFFERENTIAL     Status: Abnormal   Collection Time    02/18/13  3:00 PM      Result Value Range   WBC 5.9  4.0 - 10.5 K/uL   RBC 5.04  3.87 - 5.11 MIL/uL   Hemoglobin 15.8 (*) 12.0 - 15.0 g/dL   HCT 82.9  56.2 - 13.0 %   MCV 86.5  78.0 - 100.0 fL   MCH 31.3  26.0 - 34.0 pg   MCHC 36.2 (*) 30.0 - 36.0 g/dL   RDW 86.5  78.4 - 69.6 %   Platelets 319  150 - 400 K/uL   Neutrophils Relative % 47  43 - 77 %   Neutro Abs 2.8  1.7 - 7.7 K/uL   Lymphocytes Relative 33  12 - 46 %   Lymphs Abs 1.9  0.7 - 4.0 K/uL   Monocytes Relative 13 (*) 3 - 12 %   Monocytes Absolute 0.8  0.1 - 1.0 K/uL   Eosinophils Relative 7 (*) 0 - 5 %   Eosinophils Absolute 0.4  0.0 - 0.7 K/uL   Basophils Relative 1  0 - 1 %   Basophils Absolute 0.1  0.0 - 0.1 K/uL  COMPREHENSIVE METABOLIC PANEL     Status: Abnormal   Collection Time    02/18/13  3:00 PM      Result Value Range   Sodium 137  135 - 145 mEq/L   Potassium 4.6  3.5 - 5.1 mEq/L   Chloride 102  96 - 112 mEq/L   CO2 25  19 - 32 mEq/L   Glucose, Bld 86  70 - 99 mg/dL   BUN 17  6 - 23 mg/dL   Creatinine, Ser 2.95  0.50 - 1.10 mg/dL   Calcium 28.4  8.4 - 13.2 mg/dL   Total Protein 7.6  6.0 - 8.3 g/dL   Albumin 4.0  3.5 - 5.2 g/dL   AST 16  0 - 37 U/L   ALT 18  0 - 35 U/L   Alkaline Phosphatase 78  39 - 117 U/L   Total Bilirubin 0.5  0.3 - 1.2 mg/dL   GFR calc non Af Amer 84 (*) >90 mL/min   GFR calc Af Amer >90  >90 mL/min   Comment:            The eGFR has been calculated     using the CKD EPI equation.     This calculation has not been     validated in all clinical     situations.     eGFR's  persistently     <90 mL/min signify     possible Chronic Kidney Disease.  CBC WITH DIFFERENTIAL     Status: Abnormal   Collection Time    03/23/13 12:34 AM      Result Value Range   WBC 15.1 (*) 4.0 - 10.5 K/uL   RBC 4.38  3.87 - 5.11 MIL/uL   Hemoglobin 14.0  12.0 - 15.0 g/dL   HCT 38.7  36.0 - 46.0 %   MCV 88.4  78.0 - 100.0 fL   MCH 32.0  26.0 - 34.0 pg   MCHC 36.2 (*) 30.0 - 36.0 g/dL   RDW 16.1  09.6 - 04.5 %   Platelets 280  150 - 400 K/uL   Neutrophils Relative % 89 (*) 43 - 77 %   Neutro Abs 13.4 (*) 1.7 - 7.7 K/uL   Lymphocytes Relative 6 (*) 12 - 46 %   Lymphs Abs 0.9  0.7 - 4.0 K/uL   Monocytes Relative 5  3 - 12 %   Monocytes Absolute 0.7  0.1 - 1.0 K/uL   Eosinophils Relative 0  0 - 5 %   Eosinophils Absolute 0.1  0.0 - 0.7 K/uL   Basophils Relative 0  0 - 1 %   Basophils Absolute 0.0  0.0 - 0.1 K/uL  COMPREHENSIVE METABOLIC PANEL     Status: Abnormal   Collection Time    03/23/13 12:34 AM      Result Value Range   Sodium 135  135 - 145 mEq/L   Potassium 4.0  3.5 - 5.1 mEq/L   Chloride 105  96 - 112 mEq/L   CO2 22  19 - 32 mEq/L   Glucose, Bld 119 (*) 70 - 99 mg/dL   BUN 20  6 - 23 mg/dL   Creatinine, Ser 4.09  0.50 - 1.10 mg/dL   Calcium 9.2  8.4 - 81.1 mg/dL   Total Protein 6.7  6.0 - 8.3 g/dL   Albumin 3.5  3.5 - 5.2 g/dL   AST 24  0 - 37 U/L   ALT 20  0 - 35 U/L   Alkaline Phosphatase 76  39 - 117 U/L   Total Bilirubin 0.7  0.3 - 1.2 mg/dL   GFR calc non Af Amer 73 (*) >90 mL/min   GFR calc Af Amer 85 (*) >90 mL/min   Comment:            The eGFR has been calculated     using the CKD EPI equation.     This calculation has not been     validated in all clinical     situations.     eGFR's persistently     <90 mL/min signify     possible Chronic Kidney Disease.  PROTIME-INR     Status: None   Collection Time    03/23/13 12:34 AM      Result Value Range   Prothrombin Time 12.9  11.6 - 15.2 seconds   INR 0.98  0.00 - 1.49  LIPASE, BLOOD      Status: None   Collection Time    03/23/13 12:34 AM      Result Value Range   Lipase 14  11 - 59 U/L  SAMPLE TO BLOOD BANK     Status: None   Collection Time    03/23/13 12:34 AM      Result Value Range   Blood Bank Specimen SAMPLE AVAILABLE FOR TESTING     Sample Expiration 03/24/2013    CG4 I-STAT (LACTIC ACID)     Status: None   Collection Time    03/23/13 12:42 AM      Result Value Range   Lactic Acid, Venous 1.32  0.5 - 2.2 mmol/L  URINALYSIS, ROUTINE W REFLEX MICROSCOPIC     Status: None   Collection Time    03/23/13  2:48 AM      Result Value Range  Color, Urine YELLOW  YELLOW   APPearance CLEAR  CLEAR   Specific Gravity, Urine 1.019  1.005 - 1.030   pH 6.0  5.0 - 8.0   Glucose, UA NEGATIVE  NEGATIVE mg/dL   Hgb urine dipstick NEGATIVE  NEGATIVE   Bilirubin Urine NEGATIVE  NEGATIVE   Ketones, ur NEGATIVE  NEGATIVE mg/dL   Protein, ur NEGATIVE  NEGATIVE mg/dL   Urobilinogen, UA 0.2  0.0 - 1.0 mg/dL   Nitrite NEGATIVE  NEGATIVE   Leukocytes, UA NEGATIVE  NEGATIVE   Comment: MICROSCOPIC NOT DONE ON URINES WITH NEGATIVE PROTEIN, BLOOD, LEUKOCYTES, NITRITE, OR GLUCOSE <1000 mg/dL.  CBC     Status: Abnormal   Collection Time    03/23/13  8:35 AM      Result Value Range   WBC 13.6 (*) 4.0 - 10.5 K/uL   RBC 3.89  3.87 - 5.11 MIL/uL   Hemoglobin 11.9 (*) 12.0 - 15.0 g/dL   HCT 96.0 (*) 45.4 - 09.8 %   MCV 91.8  78.0 - 100.0 fL   MCH 30.6  26.0 - 34.0 pg   MCHC 33.3  30.0 - 36.0 g/dL   RDW 11.9  14.7 - 82.9 %   Platelets 230  150 - 400 K/uL  CREATININE, SERUM     Status: Abnormal   Collection Time    03/23/13  8:35 AM      Result Value Range   Creatinine, Ser 0.66  0.50 - 1.10 mg/dL   GFR calc non Af Amer 89 (*) >90 mL/min   GFR calc Af Amer >90  >90 mL/min   Comment:            The eGFR has been calculated     using the CKD EPI equation.     This calculation has not been     validated in all clinical     situations.     eGFR's persistently     <90 mL/min  signify     possible Chronic Kidney Disease.  TSH     Status: None   Collection Time    03/23/13  8:35 AM      Result Value Range   TSH 0.938  0.350 - 4.500 uIU/mL  CBC WITH DIFFERENTIAL     Status: Abnormal   Collection Time    03/24/13  4:50 AM      Result Value Range   WBC 11.0 (*) 4.0 - 10.5 K/uL   RBC 4.31  3.87 - 5.11 MIL/uL   Hemoglobin 13.1  12.0 - 15.0 g/dL   HCT 56.2  13.0 - 86.5 %   MCV 91.2  78.0 - 100.0 fL   MCH 30.4  26.0 - 34.0 pg   MCHC 33.3  30.0 - 36.0 g/dL   RDW 78.4  69.6 - 29.5 %   Platelets 259  150 - 400 K/uL   Neutrophils Relative % 85 (*) 43 - 77 %   Neutro Abs 9.4 (*) 1.7 - 7.7 K/uL   Lymphocytes Relative 8 (*) 12 - 46 %   Lymphs Abs 0.9  0.7 - 4.0 K/uL   Monocytes Relative 6  3 - 12 %   Monocytes Absolute 0.7  0.1 - 1.0 K/uL   Eosinophils Relative 0  0 - 5 %   Eosinophils Absolute 0.0  0.0 - 0.7 K/uL   Basophils Relative 0  0 - 1 %   Basophils Absolute 0.0  0.0 - 0.1 K/uL  BASIC METABOLIC PANEL  Status: Abnormal   Collection Time    03/24/13  4:50 AM      Result Value Range   Sodium 134 (*) 135 - 145 mEq/L   Potassium 3.5  3.5 - 5.1 mEq/L   Chloride 104  96 - 112 mEq/L   CO2 23  19 - 32 mEq/L   Glucose, Bld 153 (*) 70 - 99 mg/dL   BUN 7  6 - 23 mg/dL   Comment: DELTA CHECK NOTED   Creatinine, Ser 0.61  0.50 - 1.10 mg/dL   Calcium 9.1  8.4 - 16.1 mg/dL   GFR calc non Af Amer >90  >90 mL/min   GFR calc Af Amer >90  >90 mL/min   Comment:            The eGFR has been calculated     using the CKD EPI equation.     This calculation has not been     validated in all clinical     situations.     eGFR's persistently     <90 mL/min signify     possible Chronic Kidney Disease.  LIPASE, BLOOD     Status: Abnormal   Collection Time    03/24/13  4:50 AM      Result Value Range   Lipase 10 (*) 11 - 59 U/L  MAGNESIUM     Status: None   Collection Time    03/24/13  4:50 AM      Result Value Range   Magnesium 1.8  1.5 - 2.5 mg/dL  BASIC  METABOLIC PANEL     Status: Abnormal   Collection Time    03/25/13  4:40 AM      Result Value Range   Sodium 136  135 - 145 mEq/L   Potassium 3.7  3.5 - 5.1 mEq/L   Chloride 103  96 - 112 mEq/L   CO2 24  19 - 32 mEq/L   Glucose, Bld 134 (*) 70 - 99 mg/dL   BUN 5 (*) 6 - 23 mg/dL   Creatinine, Ser 0.96  0.50 - 1.10 mg/dL   Calcium 9.2  8.4 - 04.5 mg/dL   GFR calc non Af Amer >90  >90 mL/min   GFR calc Af Amer >90  >90 mL/min   Comment:            The eGFR has been calculated     using the CKD EPI equation.     This calculation has not been     validated in all clinical     situations.     eGFR's persistently     <90 mL/min signify     possible Chronic Kidney Disease.  CBC WITH DIFFERENTIAL     Status: Abnormal   Collection Time    03/25/13  4:40 AM      Result Value Range   WBC 8.8  4.0 - 10.5 K/uL   RBC 4.18  3.87 - 5.11 MIL/uL   Hemoglobin 12.7  12.0 - 15.0 g/dL   HCT 40.9  81.1 - 91.4 %   MCV 90.4  78.0 - 100.0 fL   MCH 30.4  26.0 - 34.0 pg   MCHC 33.6  30.0 - 36.0 g/dL   RDW 78.2  95.6 - 21.3 %   Platelets 270  150 - 400 K/uL   Neutrophils Relative % 79 (*) 43 - 77 %   Neutro Abs 7.0  1.7 - 7.7 K/uL   Lymphocytes Relative 8 (*) 12 -  46 %   Lymphs Abs 0.7  0.7 - 4.0 K/uL   Monocytes Relative 10  3 - 12 %   Monocytes Absolute 0.9  0.1 - 1.0 K/uL   Eosinophils Relative 2  0 - 5 %   Eosinophils Absolute 0.2  0.0 - 0.7 K/uL   Basophils Relative 0  0 - 1 %   Basophils Absolute 0.0  0.0 - 0.1 K/uL  COMPREHENSIVE METABOLIC PANEL     Status: Abnormal   Collection Time    03/28/13  4:30 AM      Result Value Range   Sodium 136  135 - 145 mEq/L   Potassium 3.3 (*) 3.5 - 5.1 mEq/L   Chloride 99  96 - 112 mEq/L   CO2 27  19 - 32 mEq/L   Glucose, Bld 91  70 - 99 mg/dL   BUN 5 (*) 6 - 23 mg/dL   Creatinine, Ser 4.09  0.50 - 1.10 mg/dL   Calcium 8.5  8.4 - 81.1 mg/dL   Total Protein 6.2  6.0 - 8.3 g/dL   Albumin 2.8 (*) 3.5 - 5.2 g/dL   AST 14  0 - 37 U/L   ALT 12  0 -  35 U/L   Alkaline Phosphatase 74  39 - 117 U/L   Total Bilirubin 0.3  0.3 - 1.2 mg/dL   GFR calc non Af Amer >90  >90 mL/min   GFR calc Af Amer >90  >90 mL/min   Comment:            The eGFR has been calculated     using the CKD EPI equation.     This calculation has not been     validated in all clinical     situations.     eGFR's persistently     <90 mL/min signify     possible Chronic Kidney Disease.  CBC     Status: None   Collection Time    03/28/13  4:30 AM      Result Value Range   WBC 9.0  4.0 - 10.5 K/uL   RBC 4.11  3.87 - 5.11 MIL/uL   Hemoglobin 12.5  12.0 - 15.0 g/dL   HCT 91.4  78.2 - 95.6 %   MCV 88.3  78.0 - 100.0 fL   MCH 30.4  26.0 - 34.0 pg   MCHC 34.4  30.0 - 36.0 g/dL   RDW 21.3  08.6 - 57.8 %   Platelets 343  150 - 400 K/uL    Imaging: 2 View abd 6/14:ABDOMEN - 2 VIEW  Comparison: 03/27/2013  Findings: There is residual gas within the small and large bowel  loops. The degree of small bowel distention has decreased. There  is contrast within the colon. Upright view demonstrates right  basilar densities suggestive for atelectasis and suspect pleural  fluid. No evidence for free air.  IMPRESSION:  Decreased small bowel distention.  Right basilar lung densities as described.  Scheduled Meds: . amitriptyline  50 mg Oral QHS  . amLODipine  5 mg Oral Daily  . anastrozole  1 mg Oral Daily  . antiseptic oral rinse  15 mL Mouth Rinse q12n4p  . aspirin  81 mg Oral Daily  . atorvastatin  40 mg Oral Daily  . heparin  5,000 Units Subcutaneous Q8H  . levothyroxine  112 mcg Oral Daily  . metoCLOPramide (REGLAN) injection  5 mg Intravenous Q8H  . mometasone-formoterol  2 puff Inhalation BID  . pantoprazole  40 mg Oral BID  . sodium chloride  3 mL Intravenous Q12H  . sucralfate  1 g Oral TID WC & HS  . tiotropium  18 mcg Inhalation Daily   Continuous Infusions: . sodium chloride 75 mL/hr at 03/28/13 0016  . dextrose 5 % and 0.9% NaCl Stopped (03/25/13  1548)   PRN Meds:.albuterol, bisacodyl, LORazepam, ondansetron (ZOFRAN) IV, ondansetron, traMADol   Physical Exam: Filed Vitals:   03/28/13 0532  BP: 148/73  Pulse: 93  Temp: 98.4 F (36.9 C)  Resp: 20  O2 sat - 91% Gen'l- WNWD white woman HEENT- C&S clear Cor  2+ radial, RRR Pulm - no increased WOB, no wheezing Abd - mild distention, hypoactive BS, not tender Neuro - A&O x 3      Assessment/Plan: 1. Abdominal pain - improved radiographically and on exam Plan Advance diet  Continue Bowel treatment  2,3) - stable 4. HTN stable and HR is up Plan Restart metoprolol  5. Pulm - stabe Plan room air Sats at rest and with walking  6. F/E/N - renal function normal. Albumin is low, prealbumin is pending.   Illene Regulus [place] [facility] (o) [phone]; (c) [phone] Call-grp - Patsi Sears IM  Tele: [phone]  03/28/2013, 10:20 AM

## 2013-03-29 NOTE — Progress Notes (Signed)
Off from oxygen for 30 minutes,sitting at the bedside,patient room air saturation was 91%,ambulated,more than midway along the hallway,patient's room air saturation dropped to 87%.Able to ambulate back and fort along the hallway,her room saturation ranges from 86-90%,otherwise patient was asymptomatic.will tried to wean her off from oxygen.

## 2013-03-29 NOTE — Progress Notes (Signed)
Subjective: [name] has tolerated a diet and she has been ambulating. She has not had a BM. Room air sats are not located  Objective: Lab:  Recent Labs  03/28/13 0430  WBC 9.0  HGB 12.5  HCT 36.3  MCV 88.3  PLT 343    Recent Labs  03/28/13 0430  NA 136  K 3.3*  CL 99  GLUCOSE 91  BUN 5*  CREATININE 0.58  CALCIUM 8.5    Imaging: No new imaging. Scheduled Meds: . amitriptyline  50 mg Oral QHS  . amLODipine  5 mg Oral Daily  . anastrozole  1 mg Oral Daily  . antiseptic oral rinse  15 mL Mouth Rinse q12n4p  . aspirin  81 mg Oral Daily  . atorvastatin  40 mg Oral Daily  . heparin  5,000 Units Subcutaneous Q8H  . levothyroxine  112 mcg Oral Daily  . metoCLOPramide (REGLAN) injection  5 mg Intravenous Q8H  . metoprolol tartrate  25 mg Oral BID  . mometasone-formoterol  2 puff Inhalation BID  . pantoprazole  40 mg Oral BID  . sodium chloride  3 mL Intravenous Q12H  . sucralfate  1 g Oral TID WC & HS  . tiotropium  18 mcg Inhalation Daily   Continuous Infusions: . sodium chloride 75 mL/hr at 03/29/13 0343  . dextrose 5 % and 0.9% NaCl Stopped (03/25/13 1548)   PRN Meds:.albuterol, bisacodyl, LORazepam, ondansetron (ZOFRAN) IV, ondansetron, traMADol   Physical Exam: Filed Vitals:   03/29/13 0427  BP: 145/79  Pulse: 80  Temp: 98.9 F (37.2 C)  Resp: 22   WNWD white woman Cor- RRR Pulm - no increased WOB Abd - BS+, soft, minimal distention Neuro - awake and alert.     Assessment/Plan: 1. Abdominal pain- making good progress. Has tolerated a diet Plan  continue to advance diet,   continue dulcolax  D/C reglan  2. Leukocytosis - resolved  3. Cardiac - stable  4. HTN - running a little high. All home medicines have been restarted  5. PUlm - room air sats notl ocated. Will reorder.  6. F/E/N -taking a diet.   [name] IM (o) [phone]; (c) [phone] Call-grp - [name] IM  Tele: [phone]  03/29/2013, 8:06 AM

## 2013-03-29 NOTE — Progress Notes (Signed)
Off from oxygen since noontime,baseline oxygen was saturation was 88%,asymptomatic.Ambulated back and fort to the hallway,patient's saturation at room air was ranging from 85-88%,patient was able to complete ambulation without any discomfort,asymptomatic.back to room ,placed back on 0xygen at 2 lpm,will try to wear her from oxygen gradually.

## 2013-03-30 NOTE — Progress Notes (Signed)
Subjective: Patient reports she is feeling well. Has eaten, has ambulated. She has had flatus but no BM.  Objective: Lab:  Recent Labs  03/28/13 0430  WBC 9.0  HGB 12.5  HCT 36.3  MCV 88.3  PLT 343    Recent Labs  03/28/13 0430  NA 136  K 3.3*  CL 99  GLUCOSE 91  BUN 5*  CREATININE 0.58  CALCIUM 8.5    Imaging:  Scheduled Meds: . amitriptyline  50 mg Oral QHS  . amLODipine  5 mg Oral Daily  . anastrozole  1 mg Oral Daily  . antiseptic oral rinse  15 mL Mouth Rinse q12n4p  . aspirin  81 mg Oral Daily  . atorvastatin  40 mg Oral Daily  . heparin  5,000 Units Subcutaneous Q8H  . levothyroxine  112 mcg Oral Daily  . metoprolol tartrate  25 mg Oral BID  . mometasone-formoterol  2 puff Inhalation BID  . pantoprazole  40 mg Oral BID  . sodium chloride  3 mL Intravenous Q12H  . sucralfate  1 g Oral TID WC & HS  . tiotropium  18 mcg Inhalation Daily   Continuous Infusions: . dextrose 5 % and 0.9% NaCl Stopped (03/25/13 1548)   PRN Meds:.albuterol, bisacodyl, LORazepam, ondansetron (ZOFRAN) IV, ondansetron, traMADol   Physical Exam: Filed Vitals:   03/30/13 0500  BP: 137/72  Pulse: 73  Temp: 98 F (36.7 C)  Resp: 16  O2 sat at rest after 3 hrs on room air 88%, with walking drop to 85%      Assessment/Plan:  for d/c home. See d/c dictation # [phone]   Illene Regulus [place] [facility] (o) [phone]; (c) [phone] Call-grp - Patsi Sears IM  Tele: [phone]  03/30/2013, 6:22 AM

## 2013-03-30 NOTE — Discharge Summary (Signed)
NAMEERNESTINE, [name] NO.:  [id number]  MEDICAL RECORD NO.:  [id number]  LOCATION:  6711                         FACILITY:  [facility]  PHYSICIAN:  Rosalyn Gess. Norins, MD  DATE OF BIRTH:  09-15-1946  DATE OF ADMISSION:  03/23/2013 DATE OF DISCHARGE:                              DISCHARGE SUMMARY   ADMITTING DIAGNOSES: 1. Abdominal pain. 2. Chronic obstructive pulmonary disease. 3. Hypertension. 4. Peripheral arterial disease. 5. Sleep apnea. 6. Altered mental status.  DISCHARGE DIAGNOSES: 1. Abdominal pain. 2. Chronic obstructive pulmonary disease. 3. Hypertension. 4. Peripheral arterial disease. 5. Sleep apnea. 6. Altered mental status.  CONSULTANTS:  Abigail Miyamoto, M.D. for General Surgery.  PROCEDURES: 1. Imaging March 23, 2013, CT angio of the abdomen and pelvis, which     showed diffuse atherosclerotic disease in the abdominal aorta and     branch vessels.  Proximal occlusion of the celiac axis and superior     mesenteric artery with distal reconstitution.  No CT changes     suggesting bowel ischemia or infarct.  Small esophageal hiatal     hernia with gastroesophageal reflux or dysmotility suggested.     Lobular architecture of the spleen.  Interstitial pattern in the     lung. 2. Two-view of the abdomen March 23, 2013, some prominent small bowel     loops are present with oral contrast ingested from CT earlier.     Findings are suggestive of ileus more than 2 bowel obstruction. 3. Abdominal x-ray March 24, 2013, persistent gaseous distention of     small bowel loops.  Favored degree of small-bowel ileus, though     partial small bowel obstruction is not excluded. 4. Abdominal x-ray March 25, 2013, revealed increased small bowel     distention. 5. Abdominal x-ray 1-view, March 26, 2013, which showed gaseous     distention of small and large bowel suggesting adynamic ileus. 6. Abdomen 1-view, March 27, 2013, similar bowel gas pattern was noted     with  mid abdominal small bowel loops less distended.  Pattern     suggestive of ileus. 7. Abdominal 2-view, March 28, 2013, with decreased small bowel     distention.  Right basilar lung densities are described.  HISTORY OF PRESENT ILLNESS:  [name] is a 67 year old woman with a history of, SMA occlusion syndrome, hyperlipidemia, chronic pain syndrome, hypertension, history of breast cancer, history of COPD, history of hypothyroid disease.  She presented to the emergency department with a 2-day history of increased abdominal pain.  She denied pain with eating and had not lost weight.  She denied fevers or chills, hematochezia or hematemesis, or melena.  She was taking 2 pain pills every 4-6 hours, but by her report, she had 3 on the day of admission. In the emergency department, she was somewhat lethargic and hypotensive. The patient was given IV fluids with improvement in systolic blood pressure to 120.  Abdominal pelvic CT angio with contrast with results as above.  The patient had a leukocytosis with a white count of 15,000. She was subsequently admitted for management of her abdominal pain. Please see the H and P for past medical  history, family history, social history, and admission examination.  HOSPITAL COURSE: 1. Abdominal pain.  The patient had persistent abdominal pain with     distention and absent bowel sounds.  Serial x-rays revealed     persistent ileus with no evidence of a true obstruction.  She was     seen in consultation by Dr. Magnus Ivan for the general surgical     service.  It was not felt that any intervention was required, and     that observation was the best course of action.  Over the next     several days, the patient slowly improved.  Reglan was added to her     regimen for several days.  The patient eventually was able to take     a diet.  Her pain was markedly improved, so that she had no pain on     the last 48 hours of her admission.  The patient had  multiple     Dulcolax suppositories and enemas, but was unable to relate passed     stool, but she was able to pass gas.  She was able to take an     advanced diet without difficulty.  With the patient's pain resolved     with her x-ray showing improvement with physical exam showing     improvement at this point, she was ready for discharge to home. Plan, the patient to be discharged home.  She is to use laxative of choice.  She is to advance her diet carefully.  She is to report back for any recurrent pain or discomfort. 2. Leukocytosis.  This problem resolved.  The patient had a single     dose of Rocephin, did not require additional antibiotics.  The     leukocytosis may have been from demargination secondary to pain     related to her abdominal problem. 3. Cardiac.  The patient remained stable throughout her     hospitalization with no significant chest pain or chest discomfort.     A 12-lead EKG from March 24, 2013, shows sinus rhythm with question     of an old infarct, but no acute changes. 4. Hypertension.  The patient's blood pressure was initially low and     her calcium-channel blocker and beta blocker were held.  Over the     succeeding days, as her blood pressure improved, her medications     were restarted.  She did do well on her discharge.  Blood pressure     was 137/72.  The patient will continue her home medicines at     discharge. 5. Pulmonary.  The patient has known COPD.  During her hospital stay,     she was kept on oxygen at 2 L with O2 sats that would range from 90-     96.  On the day prior to discharge, room air O2 sat after 3 hours     was 88% and with walking dropped to 85%.  The patient was not     uncomfortable.  She did not report feeling dyspneic.  Plan, the     patient will continue her home inhalational regimen.  She is to     report back at followup whether she has been short of breath or     dyspneic, and will also follow up with a oxygen saturation  studies     in the office. 6. Fluids, electrolytes and nutrition.  The patient did have a drop in  albumin.  Prealbumin was also low at 8.6.  However, the patient     started taking a diet and enteral feeding was not required nor was     parental feeding required.  Plan, the patient to continue a regular     diet at home.  She will get followup laboratory as an outpatient to     ensure her nutritional status is improved.  With the patient's multiple problems as listed above being stable,     she was thought to be ready for discharge to home.  DISCHARGE EXAMINATION:  VITAL SIGNS:  Temperature was 98, blood pressure 137/72, heart rate is 73, respirations 16, O2 saturation was 93% on 2 L. GENERAL APPEARANCE:  This is a well-nourished woman, in no acute distress. HEENT:  Conjunctivae and sclerae was clear.  Pupils equal, round, and reactive.  Oropharynx without lesions. NECK:  Supple. CHEST:  Without deformity. PULMONARY:  The patient has normal respirations with no increased work of breathing.  She had no rales or wheezes on examination. CARDIOVASCULAR:  2+ radial pulse was noted.  She had no JVD or carotid bruits.  She had a quiet precordium with a regular rate and rhythm. ABDOMEN:  The patient had hypoactive bowel sounds present in all 4 quadrants.  She had no guarding or rebound.  She had no significant distention.  No masses were appreciated.  No tenderness to palpation. GENITALIA/RECTAL:  Deferred. EXTREMITIES:  Without clubbing, cyanosis, edema, or deformity. NEUROLOGIC:  Nonfocal.  FINAL LABORATORY DATA:  From March 28, 2013; sodium 136, potassium 3.3, chloride 99, CO2 of 27, BUN 5, creatinine 0.58, glucose was 91. Alkaline phosphatase was 74.  Albumin was 2.8, AST of 14, ALT of 12, total protein was 6.2.  Pre-albumin was 8.6.  Early in her admission; lactic acid was 1.32 and normal.  DISCHARGE MEDICATIONS: 1. Albuterol metered-dose inhaler 2 puffs q.6 p.r.n. as rescue      medication. 2. Elavil 25 mg 2 tablets at bedtime. 3. Amlodipine 10 mg daily. 4. Arimidex 1 mg daily. 5. Aspirin 81 mg daily. 6. Lipitor 40 mg daily. 7. B complex vitamins 1 daily. 8. Calcium with D, taken daily. 9. Advair 100/50 two puffs a.m. and at bedtime. 10.Hydrocodone APAP 5/325, one tablet q.4 hours p.r.n., with caution     to avoid excessive use. 11.Levothyroxine 112 mcg daily. 12.Lorazepam 2 mg daily p.r.n. anxiety. 13.Metoclopramide 10 mg 30 minutes before each meal and at bedtime. 14.Metoprolol tartrate 25 mg 1 tablet twice daily. 15.Prilosec 40 mg q.a.m. and at bedtime. 16.Spiriva 1 inhalation daily.  DISPOSITION:  The patient is to be discharged home.  FOLLOWUP:  The patient will be seen within 7-10 days by Dr. Debby Bud.  She will follow up with Pulmonary as previously scheduled.  The patient's condition at time of discharge dictation is medically stable.     Rosalyn Gess Norins, MD     MEN/MEDQ  D:  03/30/2013  T:  03/30/2013  Job:  [id number]

## 2013-03-31 ENCOUNTER — Telehealth: Payer: Self-pay | Admitting: General Practice

## 2013-03-31 NOTE — Telephone Encounter (Signed)
Transitional care call:  Patient discharged from hospital on 6/16.  Discharge diagnoses:  Abdominal pain - Persistent ileum with no evidence of true obstruction.  Spoke directly with patient.  Patient very upbeat today and states that she is feeling good.  Patient states that she is having very little abdominal pain and did have a bowel movement this morning (no blood). Patient states that appetite is returning and she is eating.   Patient denied questions in regard to discharge instructions.  RN reviewed medications with patient and pt does have all medications in the home. She did say that she wants Dr. Debby Bud to review medications with her at next visit in hopes that she can discontinue some of then.  Patient does not live alone and says that she has plenty of help in the home.  Patient denies any questions at the present for Dr. Debby Bud.  Follow up appointment scheduled for 6/25.

## 2013-04-08 ENCOUNTER — Ambulatory Visit (INDEPENDENT_AMBULATORY_CARE_PROVIDER_SITE_OTHER): Payer: Medicare Other | Admitting: Internal Medicine

## 2013-04-08 ENCOUNTER — Encounter: Payer: Self-pay | Admitting: Internal Medicine

## 2013-04-08 VITALS — BP 118/70 | HR 75 | Temp 97.9°F | Resp 16 | Ht 66.0 in | Wt 149.0 lb

## 2013-04-08 DIAGNOSIS — J449 Chronic obstructive pulmonary disease, unspecified: Secondary | ICD-10-CM

## 2013-04-08 DIAGNOSIS — J4489 Other specified chronic obstructive pulmonary disease: Secondary | ICD-10-CM

## 2013-04-08 DIAGNOSIS — Z720 Tobacco use: Secondary | ICD-10-CM

## 2013-04-08 DIAGNOSIS — K551 Chronic vascular disorders of intestine: Secondary | ICD-10-CM

## 2013-04-08 DIAGNOSIS — I10 Essential (primary) hypertension: Secondary | ICD-10-CM

## 2013-04-08 NOTE — Assessment & Plan Note (Signed)
Not smoking (rarely slips up)

## 2013-04-08 NOTE — Patient Instructions (Addendum)
You are looking much better. On exam - you have hypoactive bowel sounds - present but not really active. It is important to keep an easy bowel habit - use the metamucil or miralax every day.  Lungs are clear today  Heart - normal on exam.  Please come to see me in 3 months - sooner for any problem.

## 2013-04-12 NOTE — Assessment & Plan Note (Signed)
BP Readings from Last 3 Encounters:  04/08/13 118/70  03/30/13 137/72  03/12/13 120/82   Good control

## 2013-04-12 NOTE — Assessment & Plan Note (Signed)
Hospitalization June 9-16, 2014 for prolonged ileus with abdominal distention and pain. CT angio March 23, 2013 - negative for ischemia. Her ileus slowly resolved w/o intervention and she is doing well at today's visit.  Plan Continue routine laxative treatment.

## 2013-04-12 NOTE — Assessment & Plan Note (Signed)
Stable on her regular regimen.

## 2013-04-12 NOTE — Progress Notes (Signed)
Subjective:    Patient ID: [name], female    DOB: February 04, 1946, 67 y.o.   MRN: [id number]  HPI [name] presents for hospital follow up: she was an in-patient at [facility] June 9-16, 2013: DISCHARGE DIAGNOSES:  1. Abdominal pain.  2. Chronic obstructive pulmonary disease.  3. Hypertension.  4. Peripheral arterial disease.  5. Sleep apnea.  6. Altered mental status. No definitive diagnosis for her abdominal pain and ileus was determined. Adhesions from prior surgery was considered. Surgery saw her but fortunately she did not require intervention. Full details of her admission are in the d/c summary - reviewed.  Since discharge she has been doing well: virtually no abdominal pain; she has been tolerating a diet; she has had BMs but continues on laxative therapy. Her COPD has been stable.  Past Medical History  Diagnosis Date  . COPD (chronic obstructive pulmonary disease)   . GERD (gastroesophageal reflux disease)   . Hypertension   . Diverticulitis     required colcectomy  . Osteoporosis   . Hyperlipidemia   . Emphysema of lung   . Cancer 2010    invasive ductal breast cancer; on tamoxifen  . Fibromyalgia 1995  . Hx of colonic polyps     Dr. Matthias Hughs -last study '11  . Peripheral vascular disease May '12 - CT angio    SMA chronic occlusion; stenosis of celiac trunk.  . Hypothyroidism   . Hearing loss of both ears     uses hearing aids  bilaterally  . Varicose veins of leg with swelling     varicose vein surgery - Dr. Guss Bunde  . Complication of anesthesia     was in ICU after lung surgery 2012  . Wears glasses    Past Surgical History  Procedure Laterality Date  . Appendectomy    . Wedge resection  1993    right upper lobe of lung as part of VAT-nodule/benign  . Colonoscopy w/ polypectomy    . Abdominal hysterectomy  1973    metorrhagia  . Colectomy  2002    sigmoid  . Cardiac catheterization  '11    no obstructive coronary disease  . Breast  surgery  2010    mastectomy with reconstructive surgery  . Breast surgery  2010    rt lump x2  . Breast surgery  2010    partial rt br reduction  . Lung surgery  9/12    rt mid lung wedge  . Partial mastectomy with needle localization Left 02/23/2013    Procedure:  LEFT PARTIAL MASTECTOMY WITH NEEDLE LOCALIZATION;  Surgeon: [name], MD;  Location: [facility];  Service: General;  Laterality: Left;   Family History  Problem Relation Age of Onset  . Colon cancer Mother     colon  . COPD Mother     brown lung  . Hypertension Mother   . Diabetes Mother   . Emphysema Mother   . Coronary artery disease Father   . Heart attack Father   . Sudden death Father   . Heart disease Father   . Cancer Sister     fallopian tube  . Hypertension Sister   . Coronary artery disease Brother   . Throat cancer Sister   . Melanoma Sister   . Hypertension Sister   . Pancreatic cancer Sister   . Cancer Sister   . Heart attack Brother     early 71's   History   Social History  .  Marital Status: Married    Spouse Name: N/A    Number of Children: 3  . Years of Education: 12   Occupational History  . beautician     retired   Social History Main Topics  . Smoking status: Former Smoker -- 0.75 packs/day    Types: Cigarettes    Quit date: 03/18/2012  . Smokeless tobacco: Never Used     Comment: smokes less   . Alcohol Use: 6.0 oz/week    10 Cans of beer per week     Comment: occ  . Drug Use: No  . Sexually Active: Not Currently   Other Topics Concern  . Not on file   Social History Narrative   HSG, beauty school. Married '69 -7 yrs/widowed; married '79 - 76yr/divorced; married '87.  2 sons - '70, '74, 1 srtep-son; 1 grandchild. 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 SO - good health.  NO history of abuse.  ACP - full code and all heroic measures.     Current Outpatient Prescriptions on File Prior to Visit  Medication Sig Dispense Refill  . amitriptyline (ELAVIL) 25  MG tablet Take 50 mg by mouth at bedtime.      Marland Kitchen amLODipine (NORVASC) 10 MG tablet Take 10 mg by mouth daily.      Marland Kitchen anastrozole (ARIMIDEX) 1 MG tablet Take 1 mg by mouth daily.      Marland Kitchen aspirin 81 MG tablet Take 81 mg by mouth daily.       Marland Kitchen atorvastatin (LIPITOR) 40 MG tablet Take 40 mg by mouth daily.      . B Complex Vitamins (VITAMIN-B COMPLEX PO) Take 1 tablet by mouth daily.       . Calcium Carbonate-Vitamin D (CALCIUM + D PO) Take 600 mg by mouth daily.       . Fluticasone-Salmeterol (ADVAIR) 100-50 MCG/DOSE AEPB Inhale 1 puff into the lungs 2 (two) times daily. As a MAINTENANCE medication.  1 each  3  . levothyroxine (SYNTHROID, LEVOTHROID) 112 MCG tablet Take 1 tablet (112 mcg total) by mouth daily.  30 tablet  5  . LORazepam (ATIVAN) 2 MG tablet Take 2 mg by mouth every 8 (eight) hours as needed for anxiety.      . metoCLOPramide (REGLAN) 10 MG tablet TAKE 1 TABLET 4 TIMES DAILY ( BEFORE MEALS & AT BEDTIME)  120 tablet  9  . metoprolol tartrate (LOPRESSOR) 25 MG tablet Take 1 tablet (25 mg total) by mouth 2 (two) times daily.  60 tablet  2  . omeprazole (PRILOSEC) 40 MG capsule Take 40 mg by mouth 2 (two) times daily.      Marland Kitchen albuterol (PROVENTIL HFA;VENTOLIN HFA) 108 (90 BASE) MCG/ACT inhaler Inhale 2 puffs into the lungs every 6 (six) hours as needed for wheezing. As a RESCUE medication  1 Inhaler  2  . tiotropium (SPIRIVA) 18 MCG inhalation capsule Place 18 mcg into inhaler and inhale daily. As a MAINTENANCE medication       No current facility-administered medications on file prior to visit.      Review of Systems Constitutional:  Negative for fever, chills, activity change and unexpected weight change.  HEENT:  Negative for hearing loss, ear pain, congestion, neck stiffness and postnasal drip. Negative for sore throat or swallowing problems. Negative for dental complaints.   Eyes: Negative for vision loss or change in visual acuity.  Respiratory: Negative for chest  tightness and wheezing beyond her normal. Negative for increased DOE.   Cardiovascular:  Negative for chest pain or palpitations. No decreased exercise tolerance Gastrointestinal:bowel habit returning to normal. No bloating or gas. No reflux or indigestion Genitourinary: Negative for urgency, frequency, flank pain and difficulty urinating.  Musculoskeletal: Negative for myalgias, back pain, arthralgias and gait problem.  Neurological: Negative for dizziness, tremors, weakness and headaches.  Hematological: Negative for adenopathy.  Psychiatric/Behavioral: Negative for behavioral problems and dysphoric mood.       Objective:   Physical Exam Filed Vitals:   04/08/13 1549  BP: 118/70  Pulse: 75  Temp: 97.9 F (36.6 C)  Resp: 16   Wt Readings from Last 3 Encounters:  04/08/13 149 lb (67.586 kg)  03/29/13 153 lb 1.6 oz (69.446 kg)  03/12/13 152 lb 8 oz (69.174 kg)   Gen'l - WNWD nicely groomed white woman in no distress HEENT - C&S clear Cor - RRR Pulm - normal respirations, no increased WOB, no wheezing. Abd - BS hypoactive but present x 4, no guarding or rebound, no tenderness to palpation. Neuro - A&O x 3       Assessment & Plan:

## 2013-04-15 ENCOUNTER — Other Ambulatory Visit: Payer: Self-pay | Admitting: Internal Medicine

## 2013-04-21 ENCOUNTER — Other Ambulatory Visit: Payer: Self-pay | Admitting: Cardiology

## 2013-04-28 DIAGNOSIS — J449 Chronic obstructive pulmonary disease, unspecified: Secondary | ICD-10-CM

## 2013-04-28 DIAGNOSIS — J4489 Other specified chronic obstructive pulmonary disease: Secondary | ICD-10-CM

## 2013-04-28 DIAGNOSIS — F17200 Nicotine dependence, unspecified, uncomplicated: Secondary | ICD-10-CM

## 2013-04-28 DIAGNOSIS — I10 Essential (primary) hypertension: Secondary | ICD-10-CM

## 2013-04-28 DIAGNOSIS — K551 Chronic vascular disorders of intestine: Secondary | ICD-10-CM

## 2013-05-04 ENCOUNTER — Other Ambulatory Visit: Payer: Self-pay

## 2013-05-04 MED ORDER — LORAZEPAM 2 MG PO TABS: 2.0000 mg | ORAL_TABLET | Freq: Three times a day (TID) | ORAL | Status: DC | PRN

## 2013-05-04 MED ORDER — OMEPRAZOLE 40 MG PO CPDR: 40.0000 mg | DELAYED_RELEASE_CAPSULE | Freq: Two times a day (BID) | ORAL | Status: DC

## 2013-05-04 NOTE — Telephone Encounter (Signed)
Lorazepam called to pharmacy  

## 2013-05-22 ENCOUNTER — Other Ambulatory Visit: Payer: Self-pay

## 2013-05-22 MED ORDER — METOPROLOL TARTRATE 25 MG PO TABS: 25.0000 mg | ORAL_TABLET | Freq: Two times a day (BID) | ORAL | Status: DC

## 2013-05-22 MED ORDER — AMITRIPTYLINE HCL 25 MG PO TABS: ORAL_TABLET | ORAL | Status: DC

## 2013-05-22 MED ORDER — AMLODIPINE BESYLATE 10 MG PO TABS: 5.0000 mg | ORAL_TABLET | Freq: Every day | ORAL | Status: DC

## 2013-06-01 ENCOUNTER — Encounter: Payer: Self-pay | Admitting: Cardiology

## 2013-06-01 ENCOUNTER — Ambulatory Visit (INDEPENDENT_AMBULATORY_CARE_PROVIDER_SITE_OTHER): Payer: Medicare Other | Admitting: Cardiology

## 2013-06-01 VITALS — BP 124/62 | HR 77 | Ht 66.0 in | Wt 152.0 lb

## 2013-06-01 DIAGNOSIS — I2510 Atherosclerotic heart disease of native coronary artery without angina pectoris: Secondary | ICD-10-CM

## 2013-06-01 DIAGNOSIS — J4489 Other specified chronic obstructive pulmonary disease: Secondary | ICD-10-CM

## 2013-06-01 DIAGNOSIS — K551 Chronic vascular disorders of intestine: Secondary | ICD-10-CM

## 2013-06-01 DIAGNOSIS — I10 Essential (primary) hypertension: Secondary | ICD-10-CM

## 2013-06-01 DIAGNOSIS — E785 Hyperlipidemia, unspecified: Secondary | ICD-10-CM

## 2013-06-01 DIAGNOSIS — J449 Chronic obstructive pulmonary disease, unspecified: Secondary | ICD-10-CM

## 2013-06-01 DIAGNOSIS — I739 Peripheral vascular disease, unspecified: Secondary | ICD-10-CM

## 2013-06-01 NOTE — Progress Notes (Signed)
Patient ID: [name], female   DOB: 1945-11-25, 67 y.o.   MRN: [id number] PCP: Dr. Debby Bud  67 y.o. with history of smoking, HTN, COPD, hyperlipidemia, and mesenteric vascular disease returns for cardiology evaluation.  Patient had been having nocturnal and post-prandial abdominal cramping.  She had a mesenteric angiogram done in 5/12 by Dr. Imogene Burn, showing occluded celiac and SMA arteries with patent IMA.  She was planned for aorto-mesenteric bypass. She was referred for cardiology evaluation prior to surgery.  However, it was ultimately decided not to treat her mesenteric disease surgically.   Given her exertional chest tightness and shortness of breath as well as known vascular disease, I set her up for left heart cath.  This was done in 5/12 and showed a 70-80% ostial stenosis of a small to moderate 2nd diagonal.  This was too small to intervene on and not likely to be particularly symptomatic.  Lower extremity arterial dopplers showed a severe focal proximal right SFA stenosis with collaterals from the PFA. She had a VATS for a right-sided lung nodule that showed granulomatous inflammation (no cancer).  She has quit smoking.  She saw Dr. Kirke Corin for PAD evaluation, and it was decided to treat her medically.  She tried cilostazol but did not see much difference with its use so stopped it.    Most recently, she was in the hospital in 6/14 with abdominal pain.  This was thought to be due to an ileus. CTA abdomen showed occlusion of the celiac trunk and SMA (unchanged) with distal reconstitution.  There was nothing to suggest bowel infarct or ischemia.  Since discharge, she has had no further abdominal pain.  She has been walking with exercise.  She will have bilateral calf pain after walking for about 15 minutes.  This is stable.  No chest pain.  She is breathing well.  No exertional dyspnea walking on flat ground.    Labs (1/12): LDL 166, HDL 87, TSH decreased, AST 97, ALT 129, K 4.9, creatinine 0.6 Labs  (5/12): K 4.8, creatinine 0.9, AST 25, ALT 37. Labs (7/12): HDL 98.6, LDL 112 Labs (9/12): K 3.7, creatinine 0.56, LFTs normal Labs (12/12): LDL 78, HDL 76, TGs 45 Labs (6/13): LDL 63, HDL 92 Labs (6/14): K 3.3, creatinine 0.58  PMH: 1. Hypothyroidism 2. HTN 3. GERD 4. Hypothyroidism 5. OSA 6. 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  The IMA was patent.  CTA abdomen (6/14) showed occluded celiac and SMA with distal reconstitution, no changes to suggest bowel infarction or ischemia.  8. Hyperlipidemia 9. COPD: PFTs (6/12) with moderate obstructive airways disease and response to bronchodilator.  10. Hyperlipidemia 11. Fibromyalgia 12. Breast cancer s/p mastectomy and chemotherapy in 2010.  13. CAD: LHC (5/12) with 70-80% ostial stenosis of a small-moderate 2nd diagonal.  This was too small to intervene upon and unlikely to cause significant symptoms.  14. PAD: Peripheral arterial dopplers (5/12) with severe focal proximal right SFA stenosis.  There were collaterals from the PFA.  Patient has claudication symptoms. Peripheral arterial dopplers in 5/13 were stable compared to 5/12.  15. 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 VATS 8/12 for right upper lobe nodule showed granulomatous material.   SH: Married, lives in [place], quit smoking 6/13.   FH: Father with MI at 38, brother with MI at 53  ROS: All systems reviewed and negative except as per  HPI.   Current Outpatient Prescriptions  Medication Sig Dispense Refill  . albuterol (PROVENTIL HFA;VENTOLIN HFA) 108 (90 BASE) MCG/ACT inhaler Inhale 2 puffs into the lungs every 6 (six) hours as needed for wheezing. As a RESCUE medication  1 Inhaler  2  . amitriptyline (ELAVIL) 25 MG tablet TAKE 2 TABLETS AT BEDTIME  60 tablet  11  . amLODipine (NORVASC) 10 MG tablet Take 0.5  tablets (5 mg total) by mouth daily.  30 tablet  5  . anastrozole (ARIMIDEX) 1 MG tablet Take 1 mg by mouth daily.      Marland Kitchen aspirin 81 MG tablet Take 81 mg by mouth daily.       Marland Kitchen atorvastatin (LIPITOR) 40 MG tablet TAKE 1 TABLET EVERY DAY  30 tablet  2  . B Complex Vitamins (VITAMIN-B COMPLEX PO) Take 1 tablet by mouth daily.       . Calcium Carbonate-Vitamin D (CALCIUM + D PO) Take 600 mg by mouth daily.       . Fluticasone-Salmeterol (ADVAIR) 100-50 MCG/DOSE AEPB Inhale 1 puff into the lungs 2 (two) times daily. As a MAINTENANCE medication.  1 each  3  . levothyroxine (SYNTHROID, LEVOTHROID) 112 MCG tablet Take 1 tablet (112 mcg total) by mouth daily.  30 tablet  5  . LORazepam (ATIVAN) 2 MG tablet Take 1 tablet (2 mg total) by mouth every 8 (eight) hours as needed for anxiety.  30 tablet  5  . metoCLOPramide (REGLAN) 10 MG tablet TAKE 1 TABLET 4 TIMES DAILY ( BEFORE MEALS & AT BEDTIME)  120 tablet  9  . metoprolol tartrate (LOPRESSOR) 25 MG tablet Take 1 tablet (25 mg total) by mouth 2 (two) times daily.  60 tablet  5  . omeprazole (PRILOSEC) 40 MG capsule Take 1 capsule (40 mg total) by mouth 2 (two) times daily.  60 capsule  5  . tiotropium (SPIRIVA) 18 MCG inhalation capsule Place 18 mcg into inhaler and inhale daily. As a MAINTENANCE medication       No current facility-administered medications for this visit.    BP 124/62  Pulse 77  Ht 5\' 6"  (1.676 m)  Wt 68.947 kg (152 lb)  BMI 24.55 kg/m2  SpO2 96% General: NAD, thin Neck: No JVD, no thyromegaly or thyroid nodule.  Lungs: Distant breath sounds bilaterally. CV: Nondisplaced PMI.  Heart regular S1/S2, no S3/S4, no murmur.  No peripheral edema.  No carotid bruit.  Difficult to palpate pedal pulses right foot.  Left foot with no PT pulse palpable but 2+ DP.  Abdomen: Soft, nontender, no hepatosplenomegaly, no distention.  Neurologic: Alert and oriented x 3.  Psych: Normal affect. Extremities: No clubbing or cyanosis.    Assessment/Plan: 1. PAD: Improved claudication with walking program.  Cilostazol did not help so she is no longer taking it.   - Repeat ABIs - Continue to walk for exercise, try to walk through claudication.  2. Mesenteric ischemia: Currently, no abdominal pain.  Last episode in 6/14 seems to have been related to an ileus. Continue non-surgical management at this point.  3. CAD: Nonobstructive CAD.  Continue ASA, statin.  4. COPD: Moderate, continue bronchodilators.  5. HTN: BP is controlled.   6. Hyperlipidemia: Check lipids today.   Followup in 1 year.   Marca Ancona 06/01/2013

## 2013-06-01 NOTE — Patient Instructions (Addendum)
Your physician has requested that you have a lower  extremity arterial duplex. This test is an ultrasound of the arteries in the legs . It looks at arterial blood flow in the legs. Allow one hour for Lower  Arterial scans. There are no restrictions or special instructions  Your physician recommends that you return for a FASTING lipid profile--you can get this the same day you have the lower extremity arterial duplex.   Your physician wants you to follow-up in: 1 year with Dr Shirlee Latch.  You will receive a reminder letter in the mail two months in advance. If you don't receive a letter, please call our office to schedule the follow-up appointment.

## 2013-06-11 ENCOUNTER — Ambulatory Visit (INDEPENDENT_AMBULATORY_CARE_PROVIDER_SITE_OTHER): Payer: Medicare Other

## 2013-06-11 ENCOUNTER — Encounter (INDEPENDENT_AMBULATORY_CARE_PROVIDER_SITE_OTHER): Payer: Medicare Other

## 2013-06-11 DIAGNOSIS — I739 Peripheral vascular disease, unspecified: Secondary | ICD-10-CM

## 2013-06-11 DIAGNOSIS — E785 Hyperlipidemia, unspecified: Secondary | ICD-10-CM

## 2013-06-12 LAB — LDL CHOLESTEROL, DIRECT: Direct LDL: 177.1 mg/dL

## 2013-06-12 LAB — LIPID PANEL
Cholesterol: 264 mg/dL — ABNORMAL HIGH (ref 0–200)
HDL: 74 mg/dL (ref >39.00)
Total CHOL/HDL Ratio: 4
Triglycerides: 121 mg/dL (ref 0.0–149.0)
VLDL: 24.2 mg/dL (ref 0.0–40.0)

## 2013-06-16 ENCOUNTER — Other Ambulatory Visit: Payer: Self-pay | Admitting: *Deleted

## 2013-06-16 DIAGNOSIS — I2510 Atherosclerotic heart disease of native coronary artery without angina pectoris: Secondary | ICD-10-CM

## 2013-06-16 MED ORDER — ATORVASTATIN CALCIUM 40 MG PO TABS: ORAL_TABLET | ORAL | Status: DC

## 2013-06-17 ENCOUNTER — Other Ambulatory Visit: Payer: Self-pay | Admitting: Internal Medicine

## 2013-08-13 ENCOUNTER — Other Ambulatory Visit: Payer: Self-pay | Admitting: Internal Medicine

## 2013-08-18 ENCOUNTER — Other Ambulatory Visit (INDEPENDENT_AMBULATORY_CARE_PROVIDER_SITE_OTHER): Payer: Medicare Other

## 2013-08-18 DIAGNOSIS — I2510 Atherosclerotic heart disease of native coronary artery without angina pectoris: Secondary | ICD-10-CM

## 2013-08-18 LAB — HEPATIC FUNCTION PANEL
ALT: 20 U/L (ref 0–35)
AST: 20 U/L (ref 0–37)
Albumin: 4.3 g/dL (ref 3.5–5.2)
Alkaline Phosphatase: 83 U/L (ref 39–117)
Bilirubin, Direct: 0 mg/dL (ref 0.0–0.3)
Total Bilirubin: 0.6 mg/dL (ref 0.3–1.2)
Total Protein: 7.9 g/dL (ref 6.0–8.3)

## 2013-08-18 LAB — LIPID PANEL
Cholesterol: 190 mg/dL (ref 0–200)
HDL: 73.1 mg/dL (ref >39.00)
Total CHOL/HDL Ratio: 3
Triglycerides: 209 mg/dL — ABNORMAL HIGH (ref 0.0–149.0)
VLDL: 41.8 mg/dL — ABNORMAL HIGH (ref 0.0–40.0)

## 2013-08-18 LAB — LDL CHOLESTEROL, DIRECT: Direct LDL: 88 mg/dL

## 2013-08-26 NOTE — Progress Notes (Signed)
Pt.notified

## 2013-09-26 ENCOUNTER — Other Ambulatory Visit: Payer: Self-pay | Admitting: Internal Medicine

## 2013-10-30 ENCOUNTER — Other Ambulatory Visit: Payer: Self-pay | Admitting: Internal Medicine

## 2013-12-17 ENCOUNTER — Other Ambulatory Visit: Payer: Self-pay | Admitting: Physician Assistant

## 2013-12-26 ENCOUNTER — Other Ambulatory Visit: Payer: Self-pay | Admitting: Internal Medicine

## 2014-01-04 ENCOUNTER — Other Ambulatory Visit: Payer: Self-pay | Admitting: *Deleted

## 2014-01-04 DIAGNOSIS — C50919 Malignant neoplasm of unspecified site of unspecified female breast: Secondary | ICD-10-CM

## 2014-01-05 ENCOUNTER — Other Ambulatory Visit: Payer: Self-pay

## 2014-01-05 ENCOUNTER — Other Ambulatory Visit: Payer: Self-pay | Admitting: Hematology and Oncology

## 2014-01-05 ENCOUNTER — Other Ambulatory Visit (HOSPITAL_BASED_OUTPATIENT_CLINIC_OR_DEPARTMENT_OTHER): Payer: Medicare Other

## 2014-01-05 ENCOUNTER — Telehealth: Payer: Self-pay

## 2014-01-05 DIAGNOSIS — R718 Other abnormality of red blood cells: Secondary | ICD-10-CM

## 2014-01-05 DIAGNOSIS — C50919 Malignant neoplasm of unspecified site of unspecified female breast: Secondary | ICD-10-CM

## 2014-01-05 LAB — CBC WITH DIFFERENTIAL/PLATELET
BASO%: 1.4 % (ref 0.0–2.0)
Basophils Absolute: 0.1 10*3/uL (ref 0.0–0.1)
EOS%: 4.1 % (ref 0.0–7.0)
Eosinophils Absolute: 0.3 10*3/uL (ref 0.0–0.5)
HCT: 49.3 % — ABNORMAL HIGH (ref 34.8–46.6)
HGB: 16.4 g/dL — ABNORMAL HIGH (ref 11.6–15.9)
LYMPH%: 23.1 % (ref 14.0–49.7)
MCH: 30.3 pg (ref 25.1–34.0)
MCHC: 33.3 g/dL (ref 31.5–36.0)
MCV: 91.1 fL (ref 79.5–101.0)
MONO#: 0.6 10*3/uL (ref 0.1–0.9)
MONO%: 7.7 % (ref 0.0–14.0)
NEUT#: 4.6 10*3/uL (ref 1.5–6.5)
NEUT%: 63.7 % (ref 38.4–76.8)
Platelets: 356 10*3/uL (ref 145–400)
RBC: 5.41 10*6/uL (ref 3.70–5.45)
RDW: 14.4 % (ref 11.2–14.5)
WBC: 7.3 10*3/uL (ref 3.9–10.3)
lymph#: 1.7 10*3/uL (ref 0.9–3.3)

## 2014-01-05 LAB — COMPREHENSIVE METABOLIC PANEL (CC13)
ALT: 15 U/L (ref 0–55)
AST: 12 U/L (ref 5–34)
Albumin: 4 g/dL (ref 3.5–5.0)
Alkaline Phosphatase: 95 U/L (ref 40–150)
Anion Gap: 10 mEq/L (ref 3–11)
BUN: 12.5 mg/dL (ref 7.0–26.0)
CO2: 24 mEq/L (ref 22–29)
Calcium: 9.8 mg/dL (ref 8.4–10.4)
Chloride: 106 mEq/L (ref 98–109)
Creatinine: 0.8 mg/dL (ref 0.6–1.1)
Glucose: 97 mg/dl (ref 70–140)
Potassium: 4.4 mEq/L (ref 3.5–5.1)
Sodium: 141 mEq/L (ref 136–145)
Total Bilirubin: 0.45 mg/dL (ref 0.20–1.20)
Total Protein: 7.5 g/dL (ref 6.4–8.3)

## 2014-01-05 NOTE — Telephone Encounter (Signed)
Patient had lab only appointment today.  HGB: 16.4, HCT: 49.3.  Per Dr Owens Loffler, asked patient if she is having any headaches, dizziness, taking any aspirin or a smoker.  Patient denies any dizziness, headaches but does take 81mg  of aspirin daily and smokes approximately 5 cigarettes a day.  Dr Owens Loffler would like labs added to her appointment 01/13/14 and wants patient to call back if she is experiencing any of these symptoms.  Patient verbalized understanding.  Labs added, POF filled out and sent to scheduling.

## 2014-01-06 ENCOUNTER — Other Ambulatory Visit: Payer: Self-pay | Admitting: *Deleted

## 2014-01-06 ENCOUNTER — Other Ambulatory Visit: Payer: Self-pay | Admitting: Internal Medicine

## 2014-01-06 ENCOUNTER — Other Ambulatory Visit: Payer: Self-pay | Admitting: Cardiology

## 2014-01-06 MED ORDER — ATORVASTATIN CALCIUM 40 MG PO TABS: ORAL_TABLET | ORAL | Status: DC

## 2014-01-11 ENCOUNTER — Other Ambulatory Visit: Payer: Self-pay | Admitting: Oncology

## 2014-01-11 ENCOUNTER — Other Ambulatory Visit: Payer: Self-pay

## 2014-01-12 ENCOUNTER — Telehealth: Payer: Self-pay | Admitting: Oncology

## 2014-01-12 ENCOUNTER — Other Ambulatory Visit: Payer: Self-pay | Admitting: *Deleted

## 2014-01-12 DIAGNOSIS — C50919 Malignant neoplasm of unspecified site of unspecified female breast: Secondary | ICD-10-CM

## 2014-01-13 ENCOUNTER — Ambulatory Visit: Payer: Medicare Other | Admitting: Oncology

## 2014-01-13 ENCOUNTER — Ambulatory Visit: Payer: Medicare Other

## 2014-01-13 ENCOUNTER — Other Ambulatory Visit (HOSPITAL_BASED_OUTPATIENT_CLINIC_OR_DEPARTMENT_OTHER): Payer: Medicare Other

## 2014-01-13 DIAGNOSIS — C50919 Malignant neoplasm of unspecified site of unspecified female breast: Secondary | ICD-10-CM

## 2014-01-13 LAB — CBC WITH DIFFERENTIAL/PLATELET
BASO%: 1.3 % (ref 0.0–2.0)
Basophils Absolute: 0.1 10*3/uL (ref 0.0–0.1)
EOS%: 3.9 % (ref 0.0–7.0)
Eosinophils Absolute: 0.3 10*3/uL (ref 0.0–0.5)
HCT: 48.6 % — ABNORMAL HIGH (ref 34.8–46.6)
HGB: 16 g/dL — ABNORMAL HIGH (ref 11.6–15.9)
LYMPH%: 31.3 % (ref 14.0–49.7)
MCH: 30.1 pg (ref 25.1–34.0)
MCHC: 32.8 g/dL (ref 31.5–36.0)
MCV: 91.7 fL (ref 79.5–101.0)
MONO#: 0.8 10*3/uL (ref 0.1–0.9)
MONO%: 11.6 % (ref 0.0–14.0)
NEUT#: 3.5 10*3/uL (ref 1.5–6.5)
NEUT%: 51.9 % (ref 38.4–76.8)
Platelets: 349 10*3/uL (ref 145–400)
RBC: 5.3 10*6/uL (ref 3.70–5.45)
RDW: 14.4 % (ref 11.2–14.5)
WBC: 6.7 10*3/uL (ref 3.9–10.3)
lymph#: 2.1 10*3/uL (ref 0.9–3.3)

## 2014-01-13 LAB — COMPREHENSIVE METABOLIC PANEL (CC13)
ALT: 16 U/L (ref 0–55)
AST: 16 U/L (ref 5–34)
Albumin: 4 g/dL (ref 3.5–5.0)
Alkaline Phosphatase: 90 U/L (ref 40–150)
Anion Gap: 9 mEq/L (ref 3–11)
BUN: 15.7 mg/dL (ref 7.0–26.0)
CO2: 22 mEq/L (ref 22–29)
Calcium: 9.2 mg/dL (ref 8.4–10.4)
Chloride: 109 mEq/L (ref 98–109)
Creatinine: 0.8 mg/dL (ref 0.6–1.1)
Glucose: 78 mg/dl (ref 70–140)
Potassium: 4.4 mEq/L (ref 3.5–5.1)
Sodium: 140 mEq/L (ref 136–145)
Total Bilirubin: 0.51 mg/dL (ref 0.20–1.20)
Total Protein: 7.3 g/dL (ref 6.4–8.3)

## 2014-02-04 ENCOUNTER — Other Ambulatory Visit: Payer: Self-pay | Admitting: Oncology

## 2014-02-04 DIAGNOSIS — C50919 Malignant neoplasm of unspecified site of unspecified female breast: Secondary | ICD-10-CM

## 2014-02-09 ENCOUNTER — Other Ambulatory Visit: Payer: Self-pay

## 2014-02-09 MED ORDER — LEVOTHYROXINE SODIUM 112 MCG PO TABS: ORAL_TABLET | ORAL | Status: DC

## 2014-02-09 MED ORDER — OMEPRAZOLE 40 MG PO CPDR: 40.0000 mg | DELAYED_RELEASE_CAPSULE | Freq: Two times a day (BID) | ORAL | Status: DC

## 2014-02-16 ENCOUNTER — Other Ambulatory Visit: Payer: Medicare Other

## 2014-02-16 ENCOUNTER — Ambulatory Visit (HOSPITAL_BASED_OUTPATIENT_CLINIC_OR_DEPARTMENT_OTHER): Payer: Medicare Other | Admitting: Hematology and Oncology

## 2014-02-16 ENCOUNTER — Telehealth: Payer: Self-pay | Admitting: Hematology and Oncology

## 2014-02-16 VITALS — BP 163/70 | HR 86 | Temp 97.8°F | Resp 20 | Ht 66.0 in | Wt 153.8 lb

## 2014-02-16 DIAGNOSIS — C50919 Malignant neoplasm of unspecified site of unspecified female breast: Secondary | ICD-10-CM

## 2014-02-16 DIAGNOSIS — J4489 Other specified chronic obstructive pulmonary disease: Secondary | ICD-10-CM

## 2014-02-16 DIAGNOSIS — J449 Chronic obstructive pulmonary disease, unspecified: Secondary | ICD-10-CM

## 2014-02-16 DIAGNOSIS — M810 Age-related osteoporosis without current pathological fracture: Secondary | ICD-10-CM

## 2014-02-16 NOTE — Progress Notes (Signed)
ID: [name]   DOB: Nov 18, 1945  MR#: [id number]  YQM#:[id number]  PCP: Adella Hare, MD GYN:  SU:  OTHER MD:  Chief complaint: followup visit for breast cancer  HISTORY OF PRESENT ILLNESS:T1 C. 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 She did not require any chest wall radiation. She was on Femara for a short while before changing to Arimidex.    INTERVAL HISTORY: [name]  is tolerating Arimidex well. She denies any hot flashes or joint pains. She smokes about 5 cigarettes per day. Since the time of last visit she underwent left breast lumpectomy in may 2014 and that revealed negative for atypia or malignancy microcalcifications were identified. Her last mammogram was done in April 2014. She is at present feeling well without any shortness of breath, chest pain, palpitations, blood in the stool, blood in the urine, denies any involuntary weight loss or decrease in appetite.  REVIEW OF SYSTEMS: In 10 point review of systems is been assessed and pertinent symptoms were mentioned in interval history PAST MEDICAL HISTORY: Past Medical History  Diagnosis Date  . COPD (chronic obstructive pulmonary disease)   . GERD (gastroesophageal reflux disease)   . Hypertension   . Diverticulitis     required colcectomy  . Osteoporosis   . Hyperlipidemia   . Emphysema of lung   . Cancer 2010    invasive ductal breast cancer; on tamoxifen  . Fibromyalgia 1995  . Hx of colonic polyps     Dr. Cristina Gong -last study '11  . Peripheral vascular disease May '12 - CT angio    SMA chronic occlusion; stenosis of celiac trunk.  . Hypothyroidism   . Hearing loss of both ears     uses hearing aids  bilaterally  . Varicose veins of leg with swelling     varicose vein  surgery - Dr. Aleda Grana  . Complication of anesthesia     was in ICU after lung surgery 2012  . Wears glasses     PAST SURGICAL HISTORY: Past Surgical History  Procedure Laterality Date  . Appendectomy    . Wedge resection  1993    right upper lobe of lung as part of VAT-nodule/benign  . Colonoscopy w/ polypectomy    . Abdominal hysterectomy  1973    metorrhagia  . Colectomy  2002    sigmoid  . Cardiac catheterization  '11    no obstructive coronary disease  . Breast surgery  2010    mastectomy with reconstructive surgery  . Breast surgery  2010    rt lump x2  . Breast surgery  2010    partial rt br reduction  . Lung surgery  9/12    rt mid lung wedge  . Partial mastectomy with needle localization Left 02/23/2013    Procedure:  LEFT PARTIAL MASTECTOMY WITH NEEDLE LOCALIZATION;  Surgeon: Adin Hector, MD;  Location: [facility];  Service: General;  Laterality: Left;    PATHOLOGY:  FAMILY HISTORY Family History  Problem Relation Age of Onset  . Colon cancer Mother     colon  . COPD Mother     brown lung  . Hypertension Mother   . Diabetes Mother   . Emphysema Mother   . Coronary artery disease Father   .  Heart attack Father   . Sudden death Father   . Heart disease Father   . Cancer Sister     fallopian tube  . Hypertension Sister   . Coronary artery disease Brother   . Throat cancer Sister   . Melanoma Sister   . Hypertension Sister   . Pancreatic cancer Sister   . Cancer Sister   . Heart attack Brother     early 72's    GYNECOLOGIC HISTORY:  SOCIAL HISTORY:    ADVANCED DIRECTIVES:  HEALTH MAINTENANCE: History  Substance Use Topics  . Smoking status: Former Smoker -- 0.75 packs/day    Types: Cigarettes    Quit date: 03/18/2012  . Smokeless tobacco: Never Used     Comment: smokes less   . Alcohol Use: 6.0 oz/week    10 Cans of beer per week     Comment: occ     Colonoscopy:  PAP:  Bone density:  Lipid  panel:  Allergies  Allergen Reactions  . Sulfonamide Derivatives Swelling    Current Outpatient Prescriptions  Medication Sig Dispense Refill  . albuterol (PROVENTIL HFA;VENTOLIN HFA) 108 (90 BASE) MCG/ACT inhaler Inhale 2 puffs into the lungs every 6 (six) hours as needed for wheezing. As a RESCUE medication  1 Inhaler  2  . amitriptyline (ELAVIL) 25 MG tablet TAKE 2 TABLETS AT BEDTIME  60 tablet  11  . amLODipine (NORVASC) 10 MG tablet Take 0.5 tablets (5 mg total) by mouth daily.  30 tablet  5  . anastrozole (ARIMIDEX) 1 MG tablet TAKE ONE TABLET BY MOUTH ONE TIME DAILY   30 tablet  0  . aspirin 81 MG tablet Take 81 mg by mouth daily.       Marland Kitchen atorvastatin (LIPITOR) 40 MG tablet TAKE ONE TABLET BY MOUTH ONE TIME DAILY  90 tablet  1  . diclofenac (VOLTAREN) 75 MG EC tablet TAKE ONE TABLET BY MOUTH TWICE DAILY   60 tablet  1  . Fluticasone-Salmeterol (ADVAIR DISKUS) 100-50 MCG/DOSE AEPB Inhale 1 puff into the lungs 2 (two) times daily. As a maintenance medication.   as needed      . levothyroxine (SYNTHROID, LEVOTHROID) 112 MCG tablet TAKE ONE TABLET BY MOUTH ONE TIME DAILY  30 tablet  3  . LORazepam (ATIVAN) 2 MG tablet TAKE ONE TABLET BY MOUTH EVERY EIGHT HOURS AS NEEDED FOR ANXIETY   30 tablet  3  . metoCLOPramide (REGLAN) 10 MG tablet Take one tablet by mouth four times daily thirty minutes before meals and bedtime  120 tablet  0  . metoprolol tartrate (LOPRESSOR) 25 MG tablet Take 1 tablet (25 mg total) by mouth 2 (two) times daily.  60 tablet  5  . omeprazole (PRILOSEC) 40 MG capsule Take 1 capsule (40 mg total) by mouth 2 (two) times daily.  60 capsule  1  . B Complex Vitamins (VITAMIN-B COMPLEX PO) Take 1 tablet by mouth daily.       . Calcium Carbonate-Vitamin D (CALCIUM + D PO) Take 600 mg by mouth daily.       Marland Kitchen tiotropium (SPIRIVA HANDIHALER) 18 MCG inhalation capsule Place 1 capsule into inhaler and inhale daily as needed       No current facility-administered medications for  this visit.    OBJECTIVE:  Blood pressure 163/70, pulse 86, temperature 97.8 F (36.6 C), temperature source Oral, resp. rate 20, height 5\' 6"  (1.676 m), weight 153 lb 12.8 oz (69.763 kg).    ECOG: 0 Sclerae  unicteric Oropharynx clear No cervical or supraclavicular adenopathy Lungs no rales or rhonchi Heart regular rate and rhythm Abd benign MSK no focal spinal tenderness, no peripheral edema Neuro: nonfocal Breasts: right breast s/p mastectomy with reconstruction; left breast status post lumpectomy scar noted in good healing stage without masses; no suspicious thickening noted to either side; no palpable nodules in either axillae. Patient examined in both sitting and lying position.    LAB RESULTS: Lab Results  Component Value Date   WBC 6.7 01/13/2014   NEUTROABS 3.5 01/13/2014   HGB 16.0* 01/13/2014   HCT 48.6* 01/13/2014   MCV 91.7 01/13/2014   PLT 349 01/13/2014      Chemistry      Component Value Date/Time   NA 140 01/13/2014 1314   NA 136 03/28/2013 0430   NA 134 12/08/2010 0858   K 4.4 01/13/2014 1314   K 3.3* 03/28/2013 0430   K 5.2* 12/08/2010 0858   CL 99 03/28/2013 0430   CL 108* 08/21/2012 1501   CL 101 12/08/2010 0858   CO2 22 01/13/2014 1314   CO2 27 03/28/2013 0430   CO2 22 12/08/2010 0858   BUN 15.7 01/13/2014 1314   BUN 5* 03/28/2013 0430   BUN 30* 12/08/2010 0858   CREATININE 0.8 01/13/2014 1314   CREATININE 0.58 03/28/2013 0430   CREATININE 0.92 06/04/2011 1615      Component Value Date/Time   CALCIUM 9.2 01/13/2014 1314   CALCIUM 8.5 03/28/2013 0430   CALCIUM 10.3 05/16/2011 1414   CALCIUM 9.9 12/08/2010 0858   ALKPHOS 90 01/13/2014 1314   ALKPHOS 83 08/18/2013 1001   ALKPHOS 97* 12/08/2010 0858   AST 16 01/13/2014 1314   AST 20 08/18/2013 1001   AST 41* 12/08/2010 0858   ALT 16 01/13/2014 1314   ALT 20 08/18/2013 1001   ALT 59* 12/08/2010 0858   BILITOT 0.51 01/13/2014 1314   BILITOT 0.6 08/18/2013 1001   BILITOT 0.60 12/08/2010 0858       Lab Results  Component Value Date    LABCA2 29 08/07/2011    No components found with this basename: [id number]    No results found for this basename: INR,  in the last 168 hours  Urinalysis    Component Value Date/Time   COLORURINE YELLOW 03/23/2013 [place] 03/23/2013 0248   LABSPEC 1.019 03/23/2013 0248   LABSPEC 1.030 03/08/2009 1016   PHURINE 6.0 03/23/2013 0248   GLUCOSEU NEGATIVE 03/23/2013 0248   GLUCOSEU NEGATIVE 09/07/2009 0752   HGBUR NEGATIVE 03/23/2013 0248   HGBUR negative 11/09/2010 0958   BILIRUBINUR NEGATIVE 03/23/2013 0248   KETONESUR NEGATIVE 03/23/2013 0248   PROTEINUR NEGATIVE 03/23/2013 0248   UROBILINOGEN 0.2 03/23/2013 0248   NITRITE NEGATIVE 03/23/2013 0248   LEUKOCYTESUR NEGATIVE 03/23/2013 0248     ASSESSMENT: 68 y.o. right breast cancer (T1 C N0 M0 ER/PR positive),status post 1. Mastectomy with myocutaneous flap 09/19/2009 2. Adjuvant chemo with q3 week 4 cycles of TC chemo 3. Femara, followed by Arimidex, started roughly Jan 2011 4. Currently on Arimidex.   5. left breast lumpectomy in may 2014 and that revealed negative for atypia or malignancy microcalcifications were identified.   PLAN: Amalie is tolerating Arimidex very well. Continue Arimidex  until January 2016.  I have ordered left breast mammogram mammogram and bone density today.  Continue taking calcium with vitamin D supplementation. Have reviewed the CBC and CMP today and those are within normal range  Follow up in February  or March of next year with Dr. Jana Hakim. Plan is to graduate her from  Arimidex therapy  Wilmon Arms , M.D. Medical oncology   02/16/2014

## 2014-02-16 NOTE — Telephone Encounter (Signed)
per [facility] sch pt appt/was unable to reach [facility] on hold/adv pt I would call and sch appt and call her back.-pt agreed/made mamma appt

## 2014-02-18 ENCOUNTER — Telehealth: Payer: Self-pay | Admitting: Oncology

## 2014-02-18 NOTE — Telephone Encounter (Signed)
cld pt to give time & date for BD. Lest messsage & adv pt in any questions in re to BD to call [phone]

## 2014-02-22 ENCOUNTER — Ambulatory Visit (HOSPITAL_COMMUNITY)
Admission: RE | Admit: 2014-02-22 | Discharge: 2014-02-22 | Disposition: A | Discharge status: Home / Self Care | Payer: Medicare Other | Source: Ambulatory Visit | Attending: Hematology and Oncology | Admitting: Hematology and Oncology

## 2014-02-22 DIAGNOSIS — C50919 Malignant neoplasm of unspecified site of unspecified female breast: Secondary | ICD-10-CM | POA: Insufficient documentation

## 2014-02-22 DIAGNOSIS — Z13820 Encounter for screening for osteoporosis: Secondary | ICD-10-CM | POA: Insufficient documentation

## 2014-02-23 ENCOUNTER — Other Ambulatory Visit: Payer: Self-pay | Admitting: Cardiology

## 2014-03-07 ENCOUNTER — Other Ambulatory Visit: Payer: Self-pay | Admitting: Oncology

## 2014-03-26 ENCOUNTER — Other Ambulatory Visit: Payer: Self-pay

## 2014-03-26 MED ORDER — METOPROLOL TARTRATE 25 MG PO TABS: 25.0000 mg | ORAL_TABLET | Freq: Two times a day (BID) | ORAL | Status: DC

## 2014-04-01 ENCOUNTER — Other Ambulatory Visit: Payer: Self-pay

## 2014-04-01 NOTE — Telephone Encounter (Signed)
Xanax too soon, should still have one refill, has 4 mo from mar 16 last rx

## 2014-04-02 NOTE — Telephone Encounter (Signed)
Pharmacy has been informed.

## 2014-04-12 ENCOUNTER — Other Ambulatory Visit: Payer: Self-pay

## 2014-04-12 NOTE — Telephone Encounter (Signed)
Fax from [facility] states patient has no more refills

## 2014-04-12 NOTE — Telephone Encounter (Signed)
OK X1 Needs PCP

## 2014-04-13 ENCOUNTER — Encounter (INDEPENDENT_AMBULATORY_CARE_PROVIDER_SITE_OTHER): Payer: Self-pay

## 2014-04-13 MED ORDER — LORAZEPAM 2 MG PO TABS: ORAL_TABLET | ORAL | Status: DC

## 2014-04-17 ENCOUNTER — Other Ambulatory Visit: Payer: Self-pay | Admitting: Internal Medicine

## 2014-04-19 ENCOUNTER — Other Ambulatory Visit: Payer: Self-pay

## 2014-04-19 MED ORDER — AMITRIPTYLINE HCL 25 MG PO TABS: ORAL_TABLET | ORAL | Status: DC

## 2014-04-28 ENCOUNTER — Other Ambulatory Visit: Payer: Self-pay

## 2014-04-28 MED ORDER — DICLOFENAC SODIUM 75 MG PO TBEC: 75.0000 mg | DELAYED_RELEASE_TABLET | Freq: Two times a day (BID) | ORAL | Status: DC

## 2014-05-10 ENCOUNTER — Other Ambulatory Visit: Payer: Self-pay | Admitting: Internal Medicine

## 2014-05-10 ENCOUNTER — Telehealth: Payer: Self-pay | Admitting: Internal Medicine

## 2014-05-10 NOTE — Telephone Encounter (Signed)
Former patient of Dr Linda Hedges 04/08/13 last office visit with Dr Linda Hedges

## 2014-05-10 NOTE — Telephone Encounter (Signed)
I already advised patient [name] was approved one time by Dr Linna Darner and called to [facility] pharmacy [phone].

## 2014-05-10 NOTE — Telephone Encounter (Signed)
Patient has been advised she needs to establish with a new pcp for further refills. She was transferred to reception to schedule an appt.

## 2014-05-10 NOTE — Telephone Encounter (Signed)
Patient was told she had to establish with Dr.Kollar before lorazepam could be refilled.  Patient did schedule an appt with Dr. Doug Sou for 10/27 to establish care.

## 2014-05-10 NOTE — Telephone Encounter (Signed)
#  30 NO REFILLS This prescription will be refilled one time by  me; it will be necessary for her to establish with a primary care physician for additional refills. Last OV 6/14 I am not taking new patients at this time as I shall be retiring on/1/17.

## 2014-06-01 ENCOUNTER — Other Ambulatory Visit: Payer: Self-pay | Admitting: Cardiology

## 2014-06-01 ENCOUNTER — Other Ambulatory Visit: Payer: Self-pay | Admitting: Internal Medicine

## 2014-06-08 ENCOUNTER — Other Ambulatory Visit: Payer: Self-pay | Admitting: Internal Medicine

## 2014-06-08 NOTE — Telephone Encounter (Signed)
   This patient is on high-dose lorazepam 2 mg 3 times a day and amitriptyline 50 mg at bedtime; she's not been seen since June 2014 by Dr. Linda Hedges. She has no appointment until October 27 with Dr. Doug Sou.  I do not feel comfortable prescribing this medicine for someone I've never seen particularly at these doses with her multiple co-morbidities ( COPD, active smoker). I recommend she make an appointment to see one of the other doctors or see if one of them is willing to Rx this until she can see Dr Doug Sou.

## 2014-06-11 ENCOUNTER — Other Ambulatory Visit: Payer: Self-pay

## 2014-06-11 MED ORDER — LEVOTHYROXINE SODIUM 112 MCG PO TABS: ORAL_TABLET | ORAL | Status: DC

## 2014-06-16 ENCOUNTER — Telehealth: Payer: Self-pay | Admitting: *Deleted

## 2014-06-16 MED ORDER — LORAZEPAM 2 MG PO TABS: ORAL_TABLET | ORAL | Status: DC

## 2014-06-16 NOTE — Telephone Encounter (Signed)
Done hardcopy to robin  

## 2014-06-16 NOTE — Telephone Encounter (Signed)
Pt states she is needing refill on her ativan she need it to help her sleep. Been taking med 35 plus years. Set up to see Dr. Doug Sou in October "27". Is this ok refill the ativan...Johny Chess

## 2014-06-17 ENCOUNTER — Other Ambulatory Visit: Payer: Self-pay | Admitting: Geriatric Medicine

## 2014-06-17 NOTE — Telephone Encounter (Signed)
md chose phone-in, called pharmacy ([facility]) spoke with chris.pharmacist gave md approval for lorazepam.../lmb

## 2014-07-05 ENCOUNTER — Other Ambulatory Visit: Payer: Self-pay | Admitting: Internal Medicine

## 2014-07-06 ENCOUNTER — Telehealth: Payer: Self-pay | Admitting: *Deleted

## 2014-07-06 MED ORDER — AMITRIPTYLINE HCL 25 MG PO TABS: ORAL_TABLET | ORAL | Status: DC

## 2014-07-06 MED ORDER — DICLOFENAC SODIUM 75 MG PO TBEC: 75.0000 mg | DELAYED_RELEASE_TABLET | Freq: Two times a day (BID) | ORAL | Status: DC

## 2014-07-06 MED ORDER — OMEPRAZOLE 40 MG PO CPDR: 40.0000 mg | DELAYED_RELEASE_CAPSULE | Freq: Two times a day (BID) | ORAL | Status: DC

## 2014-07-06 NOTE — Telephone Encounter (Signed)
Pt stated she has been trying to get refills on her meds and they keep denying meds. Verified refill info inform pt will send refills back on her amitriptyline, diclofenac, and omeprazole until she get establish to see Dr. Doug Sou on 08/10/14...[name]

## 2014-07-12 ENCOUNTER — Other Ambulatory Visit: Payer: Self-pay | Admitting: *Deleted

## 2014-07-12 DIAGNOSIS — C50919 Malignant neoplasm of unspecified site of unspecified female breast: Secondary | ICD-10-CM

## 2014-07-13 ENCOUNTER — Other Ambulatory Visit: Payer: Self-pay | Admitting: *Deleted

## 2014-07-13 MED ORDER — METOCLOPRAMIDE HCL 10 MG PO TABS: 10.0000 mg | ORAL_TABLET | Freq: Four times a day (QID) | ORAL | Status: DC | PRN

## 2014-07-13 NOTE — Telephone Encounter (Signed)
This RN spoke with pt per her call to after hours service.  Per discussion- [name] states she uses the reglan due to side effects of her " oral chemotherapy " prescribed by this office.  This RN reviewed meds and informed pt she is on an antiestrogen prescribed by this office.  Plan is refill will be given secondary to side effects from anastrazole.

## 2014-08-10 ENCOUNTER — Encounter: Payer: Self-pay | Admitting: Internal Medicine

## 2014-08-10 ENCOUNTER — Ambulatory Visit (INDEPENDENT_AMBULATORY_CARE_PROVIDER_SITE_OTHER): Payer: Medicare Other | Admitting: Internal Medicine

## 2014-08-10 VITALS — BP 130/68 | HR 76 | Temp 97.9°F | Resp 18 | Ht 66.0 in | Wt 150.8 lb

## 2014-08-10 DIAGNOSIS — Z720 Tobacco use: Secondary | ICD-10-CM

## 2014-08-10 DIAGNOSIS — C50919 Malignant neoplasm of unspecified site of unspecified female breast: Secondary | ICD-10-CM

## 2014-08-10 DIAGNOSIS — E039 Hypothyroidism, unspecified: Secondary | ICD-10-CM

## 2014-08-10 DIAGNOSIS — Z299 Encounter for prophylactic measures, unspecified: Secondary | ICD-10-CM

## 2014-08-10 DIAGNOSIS — J439 Emphysema, unspecified: Secondary | ICD-10-CM

## 2014-08-10 DIAGNOSIS — K551 Chronic vascular disorders of intestine: Secondary | ICD-10-CM

## 2014-08-10 DIAGNOSIS — Z23 Encounter for immunization: Secondary | ICD-10-CM

## 2014-08-10 DIAGNOSIS — I10 Essential (primary) hypertension: Secondary | ICD-10-CM

## 2014-08-10 DIAGNOSIS — Z418 Encounter for other procedures for purposes other than remedying health state: Secondary | ICD-10-CM

## 2014-08-10 MED ORDER — FLUTICASONE-SALMETEROL 100-50 MCG/DOSE IN AEPB: 1.0000 | INHALATION_SPRAY | Freq: Two times a day (BID) | RESPIRATORY_TRACT | Status: DC

## 2014-08-10 MED ORDER — BISACODYL 5 MG PO TBEC: 5.0000 mg | DELAYED_RELEASE_TABLET | Freq: Every day | ORAL | Status: DC | PRN

## 2014-08-10 MED ORDER — AMLODIPINE BESYLATE 5 MG PO TABS: 5.0000 mg | ORAL_TABLET | Freq: Every day | ORAL | Status: DC

## 2014-08-10 MED ORDER — LORAZEPAM 2 MG PO TABS: 2.0000 mg | ORAL_TABLET | Freq: Every evening | ORAL | Status: DC | PRN

## 2014-08-10 MED ORDER — OMEPRAZOLE 40 MG PO CPDR: 40.0000 mg | DELAYED_RELEASE_CAPSULE | Freq: Two times a day (BID) | ORAL | Status: DC

## 2014-08-10 MED ORDER — TIOTROPIUM BROMIDE MONOHYDRATE 18 MCG IN CAPS: 18.0000 ug | ORAL_CAPSULE | Freq: Every day | RESPIRATORY_TRACT | Status: DC

## 2014-08-10 MED ORDER — AMITRIPTYLINE HCL 50 MG PO TABS: 50.0000 mg | ORAL_TABLET | Freq: Every day | ORAL | Status: DC

## 2014-08-10 MED ORDER — LEVOTHYROXINE SODIUM 112 MCG PO TABS: ORAL_TABLET | ORAL | Status: DC

## 2014-08-10 MED ORDER — ALBUTEROL SULFATE HFA 108 (90 BASE) MCG/ACT IN AERS: 2.0000 | INHALATION_SPRAY | Freq: Four times a day (QID) | RESPIRATORY_TRACT | Status: DC | PRN

## 2014-08-10 MED ORDER — METOPROLOL TARTRATE 25 MG PO TABS: 25.0000 mg | ORAL_TABLET | Freq: Two times a day (BID) | ORAL | Status: DC

## 2014-08-10 MED ORDER — DICLOFENAC SODIUM 75 MG PO TBEC: 75.0000 mg | DELAYED_RELEASE_TABLET | Freq: Two times a day (BID) | ORAL | Status: DC

## 2014-08-10 NOTE — Progress Notes (Signed)
Pre visit review using our clinic review tool, if applicable. No additional management support is needed unless otherwise documented below in the visit note. 

## 2014-08-10 NOTE — Patient Instructions (Signed)
I have refilled all of your medicines. 2 of the medicines the strength of the pill was changed to simplify things. Your amlodipine was changed to a 5 mg pill so you will take 1 pill daily (instead of 1/2 of 10 mg pill). Also your amitriptyline was changed to 50 mg pill so take 1 pill at night time (instead of 2 pills 25 mg).   Come back in about 1 year for a follow up. If you are sick or have problems or questions please feel free to contact our office sooner.  We have given you the flu shot and the update to your pneumonia shot today.   Work on quitting cigarettes as these are causing a lot of the blood vessel disease in your stomach to worsen over time and your breathing problems.   Smoking Cessation, Tips for Success If you are ready to quit smoking, congratulations! You have chosen to help yourself be healthier. Cigarettes bring nicotine, tar, carbon monoxide, and other irritants into your body. Your lungs, heart, and blood vessels will be able to work better without these poisons. There are many different ways to quit smoking. Nicotine gum, nicotine patches, a nicotine inhaler, or nicotine nasal spray can help with physical craving. Hypnosis, support groups, and medicines help break the habit of smoking. WHAT THINGS CAN I DO TO MAKE QUITTING EASIER?  Here are some tips to help you quit for good:  Pick a date when you will quit smoking completely. Tell all of your friends and family about your plan to quit on that date.  Do not try to slowly cut down on the number of cigarettes you are smoking. Pick a quit date and quit smoking completely starting on that day.  Throw away all cigarettes.   Clean and remove all ashtrays from your home, work, and car.  On a card, write down your reasons for quitting. Carry the card with you and read it when you get the urge to smoke.  Cleanse your body of nicotine. Drink enough water and fluids to keep your urine clear or pale yellow. Do this after quitting  to flush the nicotine from your body.  Learn to predict your moods. Do not let a bad situation be your excuse to have a cigarette. Some situations in your life might tempt you into wanting a cigarette.  Never have "just one" cigarette. It leads to wanting another and another. Remind yourself of your decision to quit.  Change habits associated with smoking. If you smoked while driving or when feeling stressed, try other activities to replace smoking. Stand up when drinking your coffee. Brush your teeth after eating. Sit in a different chair when you read the paper. Avoid alcohol while trying to quit, and try to drink fewer caffeinated beverages. Alcohol and caffeine may urge you to smoke.  Avoid foods and drinks that can trigger a desire to smoke, such as sugary or spicy foods and alcohol.  Ask people who smoke not to smoke around you.  Have something planned to do right after eating or having a cup of coffee. For example, plan to take a walk or exercise.  Try a relaxation exercise to calm you down and decrease your stress. Remember, you may be tense and nervous for the first 2 weeks after you quit, but this will pass.  Find new activities to keep your hands busy. Play with a pen, coin, or rubber band. Doodle or draw things on paper.  Brush your teeth right after  eating. This will help cut down on the craving for the taste of tobacco after meals. You can also try mouthwash.   Use oral substitutes in place of cigarettes. Try using lemon drops, carrots, cinnamon sticks, or chewing gum. Keep them handy so they are available when you have the urge to smoke.  When you have the urge to smoke, try deep breathing.  Designate your home as a nonsmoking area.  If you are a heavy smoker, ask your health care provider about a prescription for nicotine chewing gum. It can ease your withdrawal from nicotine.  Reward yourself. Set aside the cigarette money you save and buy yourself something  nice.  Look for support from others. Join a support group or smoking cessation program. Ask someone at home or at work to help you with your plan to quit smoking.  Always ask yourself, "Do I need this cigarette or is this just a reflex?" Tell yourself, "Today, I choose not to smoke," or "I do not want to smoke." You are reminding yourself of your decision to quit.  Do not replace cigarette smoking with electronic cigarettes (commonly called e-cigarettes). The safety of e-cigarettes is unknown, and some may contain harmful chemicals.  If you relapse, do not give up! Plan ahead and think about what you will do the next time you get the urge to smoke. HOW WILL I FEEL WHEN I QUIT SMOKING? You may have symptoms of withdrawal because your body is used to nicotine (the addictive substance in cigarettes). You may crave cigarettes, be irritable, feel very hungry, cough often, get headaches, or have difficulty concentrating. The withdrawal symptoms are only temporary. They are strongest when you first quit but will go away within 10-14 days. When withdrawal symptoms occur, stay in control. Think about your reasons for quitting. Remind yourself that these are signs that your body is healing and getting used to being without cigarettes. Remember that withdrawal symptoms are easier to treat than the major diseases that smoking can cause.  Even after the withdrawal is over, expect periodic urges to smoke. However, these cravings are generally short lived and will go away whether you smoke or not. Do not smoke! WHAT RESOURCES ARE AVAILABLE TO HELP ME QUIT SMOKING? Your health care provider can direct you to community resources or hospitals for support, which may include:  Group support.  Education.  Hypnosis.  Therapy. Document Released: 06/29/2004 Document Revised: 02/15/2014 Document Reviewed: 03/19/2013 [facility] Patient Information 2015 [place], [place]. This information is not intended to replace advice  given to you by your health care provider. Make sure you discuss any questions you have with your health care provider.

## 2014-08-11 NOTE — Assessment & Plan Note (Signed)
S/P mastectomy and finishing 5 years arimidex in Jan 2016.

## 2014-08-11 NOTE — Assessment & Plan Note (Signed)
BP well controlled with amlodipine and metoprolol.

## 2014-08-11 NOTE — Assessment & Plan Note (Signed)
Back smoking after quitting for almost 2 years. She knows she needs to quit and I reminded her about the harms of cigarette smoke.

## 2014-08-11 NOTE — Assessment & Plan Note (Signed)
Continue synthroid, check TSH.

## 2014-08-11 NOTE — Assessment & Plan Note (Signed)
Currently stable on her inhalers, she knows she needs to quit smoking again as she restarted the habit recently.

## 2014-08-11 NOTE — Progress Notes (Signed)
   Subjective:    Patient ID: [name], female    DOB: 05/29/1946, 68 y.o.   MRN: [id number]  HPI The patient is a 68 YO female who is coming in today to establish care. She has PMH of COPD, tobacco abuse, hx breast cancer, hypothyroidism, fibromyalgia, HTN, hyperlipidiemia, PVD. She has started smoking again recently and is a little disappointed with herself. She denies chest pains, SOB. She has chronic cough but no sputum. She is still having some nausea and gets full quickly.   Review of Systems  Constitutional: Negative for fever, activity change, fatigue and unexpected weight change.  Respiratory: Positive for cough. Negative for chest tightness, shortness of breath and wheezing.   Cardiovascular: Negative for chest pain, palpitations and leg swelling.  Gastrointestinal: Positive for nausea. Negative for vomiting, abdominal pain, diarrhea, constipation and abdominal distention.  Musculoskeletal: Positive for myalgias. Negative for back pain and gait problem.  Skin: Negative.   Neurological: Negative for dizziness, weakness, light-headedness and headaches.      Objective:   Physical Exam  Constitutional: She is oriented to person, place, and time. She appears well-developed and well-nourished.  HENT:  Head: Normocephalic and atraumatic.  Eyes: EOM are normal.  Neck: Normal range of motion.  Cardiovascular: Normal rate and regular rhythm.   Pulmonary/Chest: Effort normal. No respiratory distress. She has wheezes. She has no rales. She exhibits no tenderness.  Abdominal: Soft. Bowel sounds are normal. She exhibits no distension. There is no tenderness. There is no rebound.  Neurological: She is alert and oriented to person, place, and time. Coordination normal.  Skin: Skin is warm and dry.   Filed Vitals:   08/10/14 1253  BP: 130/68  Pulse: 76  Temp: 97.9 F (36.6 C)  TempSrc: Oral  Resp: 18  Height: 5\' 6"  (1.676 m)  Weight: 150 lb 12.8 oz (68.402 kg)  SpO2: 92%        Assessment & Plan:  Prevnar vaccine given today.

## 2014-08-11 NOTE — Assessment & Plan Note (Signed)
Appears to be stable at this time, she was reminded that her smoking is likely causing more damage to her blood vessels.

## 2014-10-05 ENCOUNTER — Other Ambulatory Visit: Payer: Self-pay | Admitting: Geriatric Medicine

## 2014-10-05 DIAGNOSIS — J439 Emphysema, unspecified: Secondary | ICD-10-CM

## 2014-10-05 MED ORDER — AMITRIPTYLINE HCL 50 MG PO TABS: 50.0000 mg | ORAL_TABLET | Freq: Every day | ORAL | Status: DC

## 2014-10-05 MED ORDER — FLUTICASONE-SALMETEROL 100-50 MCG/DOSE IN AEPB: 1.0000 | INHALATION_SPRAY | Freq: Two times a day (BID) | RESPIRATORY_TRACT | Status: DC

## 2014-10-05 MED ORDER — AMLODIPINE BESYLATE 5 MG PO TABS: 5.0000 mg | ORAL_TABLET | Freq: Every day | ORAL | Status: DC

## 2014-10-28 ENCOUNTER — Other Ambulatory Visit: Payer: Self-pay | Admitting: Geriatric Medicine

## 2014-10-28 MED ORDER — DICLOFENAC SODIUM 75 MG PO TBEC: 75.0000 mg | DELAYED_RELEASE_TABLET | Freq: Two times a day (BID) | ORAL | Status: DC

## 2014-10-28 MED ORDER — METOPROLOL TARTRATE 25 MG PO TABS: 25.0000 mg | ORAL_TABLET | Freq: Two times a day (BID) | ORAL | Status: DC

## 2014-10-28 MED ORDER — ATORVASTATIN CALCIUM 40 MG PO TABS: 40.0000 mg | ORAL_TABLET | Freq: Every day | ORAL | Status: DC

## 2014-10-29 ENCOUNTER — Other Ambulatory Visit: Payer: Self-pay | Admitting: Geriatric Medicine

## 2014-10-29 MED ORDER — OMEPRAZOLE 40 MG PO CPDR: 40.0000 mg | DELAYED_RELEASE_CAPSULE | Freq: Two times a day (BID) | ORAL | Status: DC

## 2014-10-29 MED ORDER — LORAZEPAM 2 MG PO TABS: 2.0000 mg | ORAL_TABLET | Freq: Every evening | ORAL | Status: DC | PRN

## 2014-10-29 MED ORDER — LEVOTHYROXINE SODIUM 112 MCG PO TABS: ORAL_TABLET | ORAL | Status: DC

## 2014-11-01 ENCOUNTER — Other Ambulatory Visit: Payer: Self-pay | Admitting: Geriatric Medicine

## 2014-11-12 ENCOUNTER — Other Ambulatory Visit: Payer: Self-pay | Admitting: Oncology

## 2014-11-13 ENCOUNTER — Other Ambulatory Visit: Payer: Self-pay | Admitting: Cardiology

## 2014-11-16 ENCOUNTER — Other Ambulatory Visit: Payer: Self-pay | Admitting: Oncology

## 2014-11-16 DIAGNOSIS — C50911 Malignant neoplasm of unspecified site of right female breast: Secondary | ICD-10-CM

## 2014-11-18 ENCOUNTER — Other Ambulatory Visit: Payer: Self-pay | Admitting: Cardiology

## 2014-11-26 ENCOUNTER — Other Ambulatory Visit: Payer: Self-pay | Admitting: Cardiology

## 2014-12-01 DIAGNOSIS — R197 Diarrhea, unspecified: Secondary | ICD-10-CM | POA: Diagnosis not present

## 2014-12-01 DIAGNOSIS — R112 Nausea with vomiting, unspecified: Secondary | ICD-10-CM | POA: Diagnosis not present

## 2014-12-08 DIAGNOSIS — R197 Diarrhea, unspecified: Secondary | ICD-10-CM | POA: Diagnosis not present

## 2014-12-08 DIAGNOSIS — K2950 Unspecified chronic gastritis without bleeding: Secondary | ICD-10-CM | POA: Diagnosis not present

## 2014-12-08 DIAGNOSIS — Z86010 Personal history of colonic polyps: Secondary | ICD-10-CM | POA: Diagnosis not present

## 2014-12-08 DIAGNOSIS — Z1211 Encounter for screening for malignant neoplasm of colon: Secondary | ICD-10-CM | POA: Diagnosis not present

## 2014-12-08 DIAGNOSIS — K2970 Gastritis, unspecified, without bleeding: Secondary | ICD-10-CM | POA: Diagnosis not present

## 2014-12-08 DIAGNOSIS — R1110 Vomiting, unspecified: Secondary | ICD-10-CM | POA: Diagnosis not present

## 2014-12-08 DIAGNOSIS — D122 Benign neoplasm of ascending colon: Secondary | ICD-10-CM | POA: Diagnosis not present

## 2014-12-08 DIAGNOSIS — Z800 Family history of malignant neoplasm of digestive organs: Secondary | ICD-10-CM | POA: Diagnosis not present

## 2014-12-08 DIAGNOSIS — D126 Benign neoplasm of colon, unspecified: Secondary | ICD-10-CM | POA: Diagnosis not present

## 2014-12-16 ENCOUNTER — Other Ambulatory Visit: Payer: Self-pay | Admitting: Oncology

## 2014-12-22 DIAGNOSIS — Z86010 Personal history of colonic polyps: Secondary | ICD-10-CM | POA: Diagnosis not present

## 2014-12-22 DIAGNOSIS — K2960 Other gastritis without bleeding: Secondary | ICD-10-CM | POA: Diagnosis not present

## 2014-12-22 DIAGNOSIS — R110 Nausea: Secondary | ICD-10-CM | POA: Diagnosis not present

## 2014-12-28 ENCOUNTER — Other Ambulatory Visit: Payer: Self-pay | Admitting: Geriatric Medicine

## 2014-12-28 ENCOUNTER — Telehealth: Payer: Self-pay | Admitting: Internal Medicine

## 2014-12-28 NOTE — Telephone Encounter (Signed)
Pt called in for refill on her LORazepam (ATIVAN) 2 MG tablet [[id number]]   Should [facility] sent [facility] on [address]

## 2014-12-28 NOTE — Telephone Encounter (Signed)
Spoke with patient and according to our chart, she got 90 pills on October 29, 2014. She is to take one pill at night. She is not due for a refill until January 28, 2015. Patient stated that she sometimes takes 2 per night if she wakes up and can't go back to sleep. I informed her that the instructions are to take one pill nightly and she has been taking more than prescribed. She said she thinks she may still have some left. I told her that those have to last her another month. Dr. Doug Sou advised that she start taking half a pill at night to keep from running completely out until we are able to refill the medication.

## 2015-01-17 ENCOUNTER — Other Ambulatory Visit: Payer: Self-pay | Admitting: Oncology

## 2015-02-03 ENCOUNTER — Telehealth: Payer: Self-pay | Admitting: Internal Medicine

## 2015-02-03 MED ORDER — LORAZEPAM 2 MG PO TABS: 2.0000 mg | ORAL_TABLET | Freq: Every evening | ORAL | Status: DC | PRN

## 2015-02-03 NOTE — Telephone Encounter (Signed)
Patient states she has been out of lorazepam for a month.  States she has been taking her script according to how it has been written.  She states she has been taking her friends drugs and she can't take anymore of theirs because they are now out.  She is requesting a script to be sent to [facility] [place] or a call back in regards.

## 2015-02-03 NOTE — Telephone Encounter (Signed)
Patient can get refill as of April 15th. She has been taking inappropriately and should only be ever taking 1 pill at night time. If she is not able to do that she needs visit to address. Refilled for 30 day supply please call in.

## 2015-02-03 NOTE — Telephone Encounter (Signed)
Please advise, thanks.

## 2015-02-04 NOTE — Telephone Encounter (Signed)
Called and advised patient that 30 day supply rx has been called in but patient will need office visit before any future refills per dr Doug Sou note, [facility] [address] [facility] request

## 2015-02-14 ENCOUNTER — Other Ambulatory Visit: Payer: Self-pay | Admitting: Oncology

## 2015-02-16 ENCOUNTER — Other Ambulatory Visit: Payer: Self-pay | Admitting: *Deleted

## 2015-02-16 ENCOUNTER — Other Ambulatory Visit: Payer: Self-pay | Admitting: Oncology

## 2015-02-16 DIAGNOSIS — C50919 Malignant neoplasm of unspecified site of unspecified female breast: Secondary | ICD-10-CM

## 2015-02-17 ENCOUNTER — Ambulatory Visit (HOSPITAL_BASED_OUTPATIENT_CLINIC_OR_DEPARTMENT_OTHER): Payer: Medicare Other | Admitting: Oncology

## 2015-02-17 ENCOUNTER — Other Ambulatory Visit (HOSPITAL_BASED_OUTPATIENT_CLINIC_OR_DEPARTMENT_OTHER): Payer: Medicare Other

## 2015-02-17 VITALS — BP 124/77 | Temp 97.9°F | Ht 66.0 in | Wt 149.9 lb

## 2015-02-17 DIAGNOSIS — C50911 Malignant neoplasm of unspecified site of right female breast: Secondary | ICD-10-CM

## 2015-02-17 DIAGNOSIS — Z9221 Personal history of antineoplastic chemotherapy: Secondary | ICD-10-CM

## 2015-02-17 DIAGNOSIS — C50919 Malignant neoplasm of unspecified site of unspecified female breast: Secondary | ICD-10-CM

## 2015-02-17 DIAGNOSIS — Z9011 Acquired absence of right breast and nipple: Secondary | ICD-10-CM

## 2015-02-17 DIAGNOSIS — Z79811 Long term (current) use of aromatase inhibitors: Secondary | ICD-10-CM | POA: Diagnosis not present

## 2015-02-17 LAB — COMPREHENSIVE METABOLIC PANEL (CC13)
ALT: 23 U/L (ref 0–55)
AST: 17 U/L (ref 5–34)
Albumin: 3.9 g/dL (ref 3.5–5.0)
Alkaline Phosphatase: 77 U/L (ref 40–150)
Anion Gap: 11 mEq/L (ref 3–11)
BUN: 16.6 mg/dL (ref 7.0–26.0)
CO2: 25 mEq/L (ref 22–29)
Calcium: 9.3 mg/dL (ref 8.4–10.4)
Chloride: 104 mEq/L (ref 98–109)
Creatinine: 0.8 mg/dL (ref 0.6–1.1)
EGFR: 77 mL/min/{1.73_m2} — ABNORMAL LOW (ref >90)
Glucose: 85 mg/dl (ref 70–140)
Potassium: 4.3 mEq/L (ref 3.5–5.1)
Sodium: 141 mEq/L (ref 136–145)
Total Bilirubin: 0.38 mg/dL (ref 0.20–1.20)
Total Protein: 7.2 g/dL (ref 6.4–8.3)

## 2015-02-17 LAB — CBC WITH DIFFERENTIAL/PLATELET
BASO%: 1 % (ref 0.0–2.0)
Basophils Absolute: 0.1 10*3/uL (ref 0.0–0.1)
EOS%: 6.2 % (ref 0.0–7.0)
Eosinophils Absolute: 0.4 10*3/uL (ref 0.0–0.5)
HCT: 47.2 % — ABNORMAL HIGH (ref 34.8–46.6)
HGB: 15.7 g/dL (ref 11.6–15.9)
LYMPH%: 27.6 % (ref 14.0–49.7)
MCH: 30.7 pg (ref 25.1–34.0)
MCHC: 33.4 g/dL (ref 31.5–36.0)
MCV: 92.1 fL (ref 79.5–101.0)
MONO#: 0.7 10*3/uL (ref 0.1–0.9)
MONO%: 9.9 % (ref 0.0–14.0)
NEUT#: 4 10*3/uL (ref 1.5–6.5)
NEUT%: 55.3 % (ref 38.4–76.8)
Platelets: 380 10*3/uL (ref 145–400)
RBC: 5.13 10*6/uL (ref 3.70–5.45)
RDW: 13.9 % (ref 11.2–14.5)
WBC: 7.2 10*3/uL (ref 3.9–10.3)
lymph#: 2 10*3/uL (ref 0.9–3.3)

## 2015-02-17 NOTE — Progress Notes (Signed)
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 She did not require any chest wall radiation. She was on Femara for a short while before changing to Arimidex.   INTERVAL HISTORY: [name]  Returns today for follow-up of her breast cancer. The interval history is stable. She continues to tolerate anastrozole with no significant side effects and particularly without the arthralgias or myalgias that some patients can experience on that medication. She just had it refilled and of course she will be completing 69 years at the end of next month  REVIEW OF SYSTEMS:  The only symptom of concern is actually an old but intermittent problem. She has bouts of nausea, vomiting and diarrhea that occur maybe every couple of months. She has been thoroughly evaluated by Dr. Tiffany Kocher for this, with colonoscopy and EGD, without a specific cause found. She herself cannot diet to dietary changes, travel , or stress. Her husband at home I does not have a similar problem. Aside from this a detailed review of systems today was noncontributory  PAST MEDICAL HISTORY: Past Medical History  Diagnosis Date  . COPD (chronic obstructive pulmonary disease)   . GERD (gastroesophageal reflux disease)   . Hypertension   . Diverticulitis     required colcectomy  . Osteoporosis   . Hyperlipidemia   . Emphysema of lung   . Cancer 2010    invasive ductal breast cancer; on tamoxifen  . Fibromyalgia 1995  . Hx of colonic polyps     Dr. Cristina Gong -last study '11  .  Peripheral vascular disease May '12 - CT angio    SMA chronic occlusion; stenosis of celiac trunk.  . Hypothyroidism   . Hearing loss of both ears     uses hearing aids  bilaterally  . Varicose veins of leg with swelling     varicose vein surgery - Dr. Aleda Grana  . Complication of anesthesia     was in ICU after lung surgery 2012  . Wears glasses     PAST SURGICAL HISTORY: Past Surgical History  Procedure Laterality Date  . Appendectomy    . Wedge resection  1993    right upper lobe of lung as part of VAT-nodule/benign  . Colonoscopy w/ polypectomy    . Abdominal hysterectomy  1973    metorrhagia  . Colectomy  2002    sigmoid  . Cardiac catheterization  '11    no obstructive coronary disease  . Breast surgery  2010    mastectomy with reconstructive surgery  . Breast surgery  2010    rt lump x2  . Breast surgery  2010    partial rt br reduction  . Lung surgery  9/12    rt mid lung wedge  . Partial mastectomy with needle localization Left 02/23/2013    Procedure:  LEFT PARTIAL MASTECTOMY WITH NEEDLE LOCALIZATION;  Surgeon: Adin Hector, MD;  Location: [facility];  Service: General;  Laterality: Left;     FAMILY HISTORY Family History  Problem Relation Age  of Onset  . Colon cancer Mother     colon  . COPD Mother     brown lung  . Hypertension Mother   . Diabetes Mother   . Emphysema Mother   . Coronary artery disease Father   . Heart attack Father   . Sudden death Father   . Heart disease Father   . Cancer Sister     fallopian tube  . Hypertension Sister   . Coronary artery disease Brother   . Throat cancer Sister   . Melanoma Sister   . Hypertension Sister   . Pancreatic cancer Sister   . Cancer Sister   . Heart attack Brother     early 62's    HEALTH MAINTENANCE: History  Substance Use Topics  . Smoking status: Former Smoker -- 0.75 packs/day    Types: Cigarettes    Quit date: 03/18/2012  . Smokeless tobacco: Never Used      Comment: smokes less   . Alcohol Use: 6.0 oz/week    10 Cans of beer per week     Comment: occ     Allergies  Allergen Reactions  . Sulfonamide Derivatives Swelling    Current Outpatient Prescriptions  Medication Sig Dispense Refill  . albuterol (PROVENTIL HFA;VENTOLIN HFA) 108 (90 BASE) MCG/ACT inhaler Inhale 2 puffs into the lungs every 6 (six) hours as needed for wheezing. As a RESCUE medication 1 Inhaler 2  . amitriptyline (ELAVIL) 50 MG tablet Take 1 tablet (50 mg total) by mouth at bedtime. 90 tablet 3  . amLODipine (NORVASC) 5 MG tablet Take 1 tablet (5 mg total) by mouth daily. 90 tablet 3  . anastrozole (ARIMIDEX) 1 MG tablet TAKE ONE TABLET BY MOUTH ONE TIME DAILY  30 tablet 12  . aspirin 81 MG tablet Take 81 mg by mouth daily.     Marland Kitchen atorvastatin (LIPITOR) 40 MG tablet Take 1 tablet (40 mg total) by mouth daily. 90 tablet 3  . atorvastatin (LIPITOR) 40 MG tablet TAKE ONE TABLET BY MOUTH ONE TIME DAILY  90 tablet 3  . B Complex Vitamins (VITAMIN-B COMPLEX PO) Take 1 tablet by mouth daily.     . bisacodyl (DULCOLAX) 5 MG EC tablet Take 1 tablet (5 mg total) by mouth daily as needed for moderate constipation. 30 tablet 11  . Calcium Carbonate-Vitamin D (CALCIUM + D PO) Take 600 mg by mouth daily.     . diclofenac (VOLTAREN) 75 MG EC tablet Take 1 tablet (75 mg total) by mouth 2 (two) times daily. 180 tablet 3  . Fluticasone-Salmeterol (ADVAIR DISKUS) 100-50 MCG/DOSE AEPB Inhale 1 puff into the lungs 2 (two) times daily. 60 each 3  . levothyroxine (SYNTHROID, LEVOTHROID) 112 MCG tablet TAKE ONE TABLET BY MOUTH ONE TIME DAILY 90 tablet 3  . LORazepam (ATIVAN) 2 MG tablet Take 1 tablet (2 mg total) by mouth at bedtime as needed for anxiety. 30 tablet 0  . metoCLOPramide (REGLAN) 10 MG tablet TAKE ONE TABLET BY MOUTH EVERY SIX HOURS AS NEEDED FOR NAUSEA 100 tablet 0  . metoprolol tartrate (LOPRESSOR) 25 MG tablet Take 1 tablet (25 mg total) by mouth 2 (two) times daily. 180 tablet 3   . omeprazole (PRILOSEC) 40 MG capsule Take 1 capsule (40 mg total) by mouth 2 (two) times daily. 180 capsule 3  . tiotropium (SPIRIVA HANDIHALER) 18 MCG inhalation capsule Place 1 capsule (18 mcg total) into inhaler and inhale daily. 30 capsule 11   No current facility-administered medications for  this visit.    OBJECTIVE:  Middle-aged white woman in no acute distress  Blood pressure 124/77, temperature 97.9 F (36.6 C), temperature source Oral, height '5\' 6"'$  (1.676 m), weight 149 lb 14.4 oz (67.994 kg).    ECOG: 1  Sclerae unicteric, pupils  Round and equal Oropharynx clear and moist No cervical or supraclavicular adenopathy Lungs no rales or rhonchi Heart regular rate and rhythm Abd soft, nontender, positive bowel sounds , no masses palpated MSK no focal spinal tenderness, no upper extremity lymphedema Neuro: nonfocal, well oriented, appropriate affect Breasts:  The right breast is status post mastectomy with reconstruction. There is no evidence of local recurrence. The right axilla is benign. The left breast is unremarkable.    LAB RESULTS: Lab Results  Component Value Date   WBC 7.2 02/17/2015   NEUTROABS 4.0 02/17/2015   HGB 15.7 02/17/2015   HCT 47.2* 02/17/2015   MCV 92.1 02/17/2015   PLT 380 02/17/2015      Chemistry      Component Value Date/Time   NA 141 02/17/2015 1249   NA 136 03/28/2013 0430   NA 134 12/08/2010 0858   K 4.3 02/17/2015 1249   K 3.3* 03/28/2013 0430   K 5.2* 12/08/2010 0858   CL 99 03/28/2013 0430   CL 108* 08/21/2012 1501   CL 101 12/08/2010 0858   CO2 25 02/17/2015 1249   CO2 27 03/28/2013 0430   CO2 22 12/08/2010 0858   BUN 16.6 02/17/2015 1249   BUN 5* 03/28/2013 0430   BUN 30* 12/08/2010 0858   CREATININE 0.8 02/17/2015 1249   CREATININE 0.58 03/28/2013 0430   CREATININE 0.92 06/04/2011 1615      Component Value Date/Time   CALCIUM 9.3 02/17/2015 1249   CALCIUM 8.5 03/28/2013 0430   CALCIUM 10.3 05/16/2011 1414   CALCIUM  9.9 12/08/2010 0858   ALKPHOS 77 02/17/2015 1249   ALKPHOS 83 08/18/2013 1001   ALKPHOS 97* 12/08/2010 0858   AST 17 02/17/2015 1249   AST 20 08/18/2013 1001   AST 41* 12/08/2010 0858   ALT 23 02/17/2015 1249   ALT 20 08/18/2013 1001   ALT 59* 12/08/2010 0858   BILITOT 0.38 02/17/2015 1249   BILITOT 0.6 08/18/2013 1001   BILITOT 0.60 12/08/2010 0858       Lab Results  Component Value Date   LABCA2 29 08/07/2011    No components found for: [id number]  No results for input(s): INR in the last 168 hours.  Urinalysis    Component Value Date/Time   COLORURINE YELLOW 03/23/2013 0248   APPEARANCEUR CLEAR 03/23/2013 0248   LABSPEC 1.019 03/23/2013 0248   LABSPEC 1.030 03/08/2009 1016   PHURINE 6.0 03/23/2013 0248   PHURINE 5.0 03/08/2009 1016   GLUCOSEU NEGATIVE 03/23/2013 0248   GLUCOSEU NEGATIVE 09/07/2009 0752   HGBUR NEGATIVE 03/23/2013 0248   HGBUR negative 11/09/2010 0958   HGBUR Negative 03/08/2009 1016   BILIRUBINUR NEGATIVE 03/23/2013 0248   BILIRUBINUR Negative 03/08/2009 1016   KETONESUR NEGATIVE 03/23/2013 0248   KETONESUR Negative 03/08/2009 1016   PROTEINUR NEGATIVE 03/23/2013 0248   PROTEINUR Negative 03/08/2009 1016   UROBILINOGEN 0.2 03/23/2013 0248   NITRITE NEGATIVE 03/23/2013 0248   NITRITE Negative 03/08/2009 1016   LEUKOCYTESUR NEGATIVE 03/23/2013 0248   LEUKOCYTESUR Negative 03/08/2009 1016     ASSESSMENT: 69 y.o. [place] woman: history of right breast cancer: pT1c pN0, stage IA, estrogen and progesterone receptor positive 1. S/p right mastectomy with myocutaneous flap 09/19/2009 2. Adjuvant  chemo with 4 cycles of  Taxotere/  Cytoxan 3. Femara, followed by Arimidex, started roughly Jan 2011, with a few months' interruption 4.  Completing 5 years of antiestrogen therapy June 2016 5.  Also s/p left breast lumpectomy in may 2014 and that revealed negative for atypia or malignancy microcalcifications were identified.   PLAN:  Valori  Has  completed 5 years of antiestrogen therapy. She is now ready to "graduate". I gave her a summary of her  Diagnoses and treatment history. She understands she has an excellent prognosis.  All she needs as far as breast cancer follow-up is concerned is yearly mammography and a yearly physician breast exam.   Turkessa knows that we will be glad to see her in the future if and when the need arises, but as of now we are making no further routine appointments for her here.  Chauncey Cruel, MD  Medical oncology   02/17/2015

## 2015-02-21 ENCOUNTER — Encounter: Payer: Self-pay | Admitting: Internal Medicine

## 2015-03-02 ENCOUNTER — Ambulatory Visit (INDEPENDENT_AMBULATORY_CARE_PROVIDER_SITE_OTHER): Payer: Medicare Other | Admitting: Internal Medicine

## 2015-03-02 ENCOUNTER — Encounter: Payer: Self-pay | Admitting: Internal Medicine

## 2015-03-02 VITALS — BP 120/62 | HR 71 | Temp 98.0°F | Resp 18 | Ht 66.0 in | Wt 151.0 lb

## 2015-03-02 DIAGNOSIS — Z23 Encounter for immunization: Secondary | ICD-10-CM | POA: Diagnosis not present

## 2015-03-02 DIAGNOSIS — G4700 Insomnia, unspecified: Secondary | ICD-10-CM

## 2015-03-02 MED ORDER — ALBUTEROL SULFATE 108 (90 BASE) MCG/ACT IN AEPB: 1.0000 | INHALATION_SPRAY | Freq: Four times a day (QID) | RESPIRATORY_TRACT | Status: DC | PRN

## 2015-03-02 MED ORDER — LORAZEPAM 2 MG PO TABS: 2.0000 mg | ORAL_TABLET | Freq: Every evening | ORAL | Status: DC | PRN

## 2015-03-02 NOTE — Patient Instructions (Signed)
We have filled the lorazepam for the 3 month supply and faxed it in today. Sorry about the misunderstanding with the medicine.

## 2015-03-02 NOTE — Progress Notes (Signed)
Pre visit review using our clinic review tool, if applicable. No additional management support is needed unless otherwise documented below in the visit note. 

## 2015-03-04 DIAGNOSIS — G4700 Insomnia, unspecified: Secondary | ICD-10-CM | POA: Insufficient documentation

## 2015-03-04 NOTE — Progress Notes (Signed)
   Subjective:    Patient ID: [name], female    DOB: 11-01-1945, 69 y.o.   MRN: [id number]  HPI The patient is here to follow up on her insomnia. She had gotten a 90 day supply sent to her pharmacy in January but ended up canceling the order as the mail in charged her different than they were supposed to. She then suffered without sleep for 3 months until we would refill. There was some concern that she was taking medicine inappropriately. She would like to clear that up. Now wants 3 month supply sent to her local pharmacy. Sleeping better since she got her medicine in April. Sleeps through the night.  Review of Systems  Constitutional: Negative for fever, activity change, fatigue and unexpected weight change.  Respiratory: Negative for chest tightness, shortness of breath and wheezing.   Cardiovascular: Negative for chest pain, palpitations and leg swelling.  Gastrointestinal: Negative for vomiting, abdominal pain, diarrhea, constipation and abdominal distention.  Musculoskeletal: Positive for myalgias. Negative for back pain and gait problem.  Neurological: Negative for dizziness, weakness, light-headedness and headaches.  Psychiatric/Behavioral: Negative for sleep disturbance.      Objective:   Physical Exam  Constitutional: She is oriented to person, place, and time. She appears well-developed and well-nourished.  HENT:  Head: Normocephalic and atraumatic.  Eyes: EOM are normal.  Neck: Normal range of motion.  Cardiovascular: Normal rate and regular rhythm.   Pulmonary/Chest: Effort normal. No respiratory distress. She has no rales. She exhibits no tenderness.  Abdominal: Soft. Bowel sounds are normal. She exhibits no distension. There is no tenderness. There is no rebound.  Neurological: She is alert and oriented to person, place, and time. Coordination normal.  Skin: Skin is warm and dry.   Filed Vitals:   03/02/15 1127  BP: 120/62  Pulse: 71  Temp: 98 F (36.7 C)    TempSrc: Oral  Resp: 18  Height: '5\' 6"'$  (1.676 m)  Weight: 151 lb (68.493 kg)  SpO2: 98%      Assessment & Plan:

## 2015-03-04 NOTE — Assessment & Plan Note (Signed)
Misunderstanding was cleared up. Refill sent in of her medication. Did talk to her about the risks and harms from being on chronic medication for sleep including memory problems, increased risk of falls, and confusion. She is willing to accept as her QOL is so poor without the medicine.

## 2015-03-28 ENCOUNTER — Ambulatory Visit: Payer: Self-pay | Admitting: Cardiology

## 2015-04-25 ENCOUNTER — Encounter: Payer: Self-pay | Admitting: Internal Medicine

## 2015-05-27 ENCOUNTER — Other Ambulatory Visit: Payer: Self-pay | Admitting: Internal Medicine

## 2015-05-27 NOTE — Telephone Encounter (Signed)
1 month prescription written until PCP return. Prescription printed and ready to fax.

## 2015-06-03 ENCOUNTER — Ambulatory Visit (INDEPENDENT_AMBULATORY_CARE_PROVIDER_SITE_OTHER): Payer: Medicare Other | Admitting: Cardiology

## 2015-06-03 ENCOUNTER — Encounter: Payer: Self-pay | Admitting: *Deleted

## 2015-06-03 ENCOUNTER — Encounter: Payer: Self-pay | Admitting: Cardiology

## 2015-06-03 VITALS — BP 130/82 | HR 74 | Ht 66.0 in | Wt 145.8 lb

## 2015-06-03 DIAGNOSIS — E785 Hyperlipidemia, unspecified: Secondary | ICD-10-CM | POA: Diagnosis not present

## 2015-06-03 DIAGNOSIS — I739 Peripheral vascular disease, unspecified: Secondary | ICD-10-CM

## 2015-06-03 DIAGNOSIS — J438 Other emphysema: Secondary | ICD-10-CM

## 2015-06-03 DIAGNOSIS — K551 Chronic vascular disorders of intestine: Secondary | ICD-10-CM | POA: Diagnosis not present

## 2015-06-03 MED ORDER — CILOSTAZOL 100 MG PO TABS: 100.0000 mg | ORAL_TABLET | Freq: Two times a day (BID) | ORAL | Status: DC

## 2015-06-03 NOTE — Patient Instructions (Addendum)
   Medication Instructions:  Start Pletal (cilestazol) '100mg'$  two times a day  Labwork: Your physician recommends that you have a lipid profile today    Testing/Procedures: Your physician has requested that you have a lower extremity arterial duplex. This test is an ultrasound of the arteries in the legs . It looks at arterial blood flow in the legs . Allow one hour for Lower Arterial scans. There are no restrictions or special instructions   Follow-Up: Your physician wants you to follow-up in: 1 year with Dr Aundra Dubin. (August 2017). You will receive a reminder letter in the mail two months in advance. If you don't receive a letter, please call our office to schedule the follow-up appointment.

## 2015-06-04 LAB — LIPID PANEL
Cholesterol: 173 mg/dL (ref 125–200)
HDL: 56 mg/dL (ref >=46)
LDL Cholesterol: 91 mg/dL (ref <130)
Total CHOL/HDL Ratio: 3.1 Ratio (ref <=5.0)
Triglycerides: 131 mg/dL (ref <150)
VLDL: 26 mg/dL (ref <30)

## 2015-06-05 NOTE — Progress Notes (Signed)
Patient ID: [name], female   DOB: 1946/08/02, 69 y.o.   MRN: [id number] PCP: Dr. Doug Sou  69 yo with history of smoking, HTN, COPD, hyperlipidemia, PAD, and mesenteric vascular disease returns for cardiology evaluation.  Patient had been having nocturnal and post-prandial abdominal cramping.  She had a mesenteric angiogram done in 5/12 by Dr. Bridgett Larsson, showing occluded celiac and SMA arteries with patent IMA.  She was planned for aorto-mesenteric bypass. She was referred for cardiology evaluation prior to surgery.  However, it was ultimately decided not to treat her mesenteric disease surgically.   Given her exertional chest tightness and shortness of breath as well as known vascular disease, I set her up for left heart cath.  This was done in 5/12 and showed a 70-80% ostial stenosis of a small to moderate 2nd diagonal.  This was too small to intervene on and not likely to be particularly symptomatic.  Lower extremity arterial dopplers showed a severe focal proximal right SFA stenosis with collaterals from the PFA. She had a VATS for a right-sided lung nodule that showed granulomatous inflammation (no cancer).  She has quit smoking.  She saw Dr. Fletcher Anon for PAD evaluation, and it was decided to treat her medically.  She tried cilostazol but did not see much difference with its use so stopped it.    No abdominal pain.  She has been walking for exercise.  She will have bilateral calf pain after walking for about 1/2 miles.  This is stable.  No chest pain.  No exertional dyspnea walking on flat ground.  Stable dyspnea walking up stairs.   Labs (1/12): LDL 166, HDL 87, TSH decreased, AST 97, ALT 129, K 4.9, creatinine 0.6 Labs (5/12): K 4.8, creatinine 0.9, AST 25, ALT 37. Labs (7/12): HDL 98.6, LDL 112 Labs (9/12): K 3.7, creatinine 0.56, LFTs normal Labs (12/12): LDL 78, HDL 76, TGs 45 Labs (6/13): LDL 63, HDL 92 Labs (6/14): K 3.3, creatinine 0.58 Labs (5/16): K 4.3, creatinine 0.8, HCT 47.2  ECG:  NSR, septal Qs  PMH: 1. Hypothyroidism 2. HTN 3. GERD 4. Hypothyroidism 5. OSA 6. 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  The IMA was patent.  CTA abdomen (6/14) showed occluded celiac and SMA with distal reconstitution, no changes to suggest bowel infarction or ischemia.  8. Hyperlipidemia 9. COPD: PFTs (6/12) with moderate obstructive airways disease and response to bronchodilator.  10. Hyperlipidemia 11. Fibromyalgia 12. Breast cancer s/p mastectomy and chemotherapy in 2010.  13. CAD: LHC (5/12) with 70-80% ostial stenosis of a small-moderate 2nd diagonal.  This was too small to intervene upon and unlikely to cause significant symptoms.  14. PAD: Peripheral arterial dopplers (5/12) with severe focal proximal right SFA stenosis.  There were collaterals from the PFA.  Patient has claudication symptoms. Peripheral arterial dopplers in 5/13 were stable compared to 5/12. Peripheral arterial dopplers (8/14) with ABIs 0.71 on right, 1.1 on left (known right SFA occlusion).  15. 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 VATS 8/12 for right upper lobe nodule showed granulomatous material.   SH: Married, lives in [place], quit smoking 6/13.   FH: Father with MI at 71, brother with MI at 19  ROS: All systems reviewed and negative except as per HPI.   Current Outpatient Prescriptions  Medication Sig Dispense Refill  . albuterol (PROVENTIL HFA;VENTOLIN HFA) 108 (90 BASE) MCG/ACT inhaler Inhale  2 puffs into the lungs every 6 (six) hours as needed for wheezing. As a RESCUE medication 1 Inhaler 2  . Albuterol Sulfate (PROAIR RESPICLICK) 161 (90 BASE) MCG/ACT AEPB Inhale 1-2 puffs into the lungs every 6 (six) hours as needed. 1 each 0  . amitriptyline (ELAVIL) 50 MG tablet Take 1 tablet (50 mg total) by mouth at bedtime. 90 tablet 3  .  amLODipine (NORVASC) 5 MG tablet Take 1 tablet (5 mg total) by mouth daily. 90 tablet 3  . aspirin 81 MG tablet Take 81 mg by mouth daily.     Marland Kitchen atorvastatin (LIPITOR) 40 MG tablet Take 1 tablet (40 mg total) by mouth daily. 90 tablet 3  . B Complex Vitamins (VITAMIN-B COMPLEX PO) Take 1 tablet by mouth daily.     . bisacodyl (DULCOLAX) 5 MG EC tablet Take 1 tablet (5 mg total) by mouth daily as needed for moderate constipation. 30 tablet 11  . Calcium Carbonate-Vitamin D (CALCIUM + D PO) Take 600 mg by mouth daily.     . diclofenac (VOLTAREN) 75 MG EC tablet Take 1 tablet (75 mg total) by mouth 2 (two) times daily. 180 tablet 3  . Fluticasone-Salmeterol (ADVAIR DISKUS) 100-50 MCG/DOSE AEPB Inhale 1 puff into the lungs 2 (two) times daily. 60 each 3  . levothyroxine (SYNTHROID, LEVOTHROID) 112 MCG tablet TAKE ONE TABLET BY MOUTH ONE TIME DAILY 90 tablet 3  . LORazepam (ATIVAN) 2 MG tablet TAKE 1 TABLET BY MOUTH AT BEDTIME AS NEEDED FOR ANXIETY 30 tablet 0  . metoCLOPramide (REGLAN) 10 MG tablet TAKE ONE TABLET BY MOUTH EVERY SIX HOURS AS NEEDED FOR NAUSEA 100 tablet 0  . metoprolol tartrate (LOPRESSOR) 25 MG tablet Take 1 tablet (25 mg total) by mouth 2 (two) times daily. 180 tablet 3  . omeprazole (PRILOSEC) 40 MG capsule Take 1 capsule (40 mg total) by mouth 2 (two) times daily. 180 capsule 3  . tiotropium (SPIRIVA HANDIHALER) 18 MCG inhalation capsule Place 1 capsule (18 mcg total) into inhaler and inhale daily. 30 capsule 11  . cilostazol (PLETAL) 100 MG tablet Take 1 tablet (100 mg total) by mouth 2 (two) times daily. 180 tablet 3   No current facility-administered medications for this visit.    BP 130/82 mmHg  Pulse 74  Ht '5\' 6"'$  (1.676 m)  Wt 145 lb 12.8 oz (66.134 kg)  BMI 23.54 kg/m2 General: NAD, thin Neck: No JVD, no thyromegaly or thyroid nodule.  Lungs: Distant breath sounds bilaterally. CV: Nondisplaced PMI.  Heart regular S1/S2, no S3/S4, no murmur.  No peripheral edema.  No  carotid bruit.  Difficult to palpate pedal pulses right foot.  Left foot with no PT pulse palpable but 2+ DP.  Abdomen: Soft, nontender, no hepatosplenomegaly, no distention.  Neurologic: Alert and oriented x 3.  Psych: Normal affect. Extremities: No clubbing or cyanosis.   Assessment/Plan: 1. PAD: Stable ABIs and claudication.     - She wants to retry cilostazol.  I will start her on it at 100 mg bid.  - Continue to walk for exercise, try to walk through claudication.  - She is due for repeat ABIs.  2. Mesenteric ischemia: Currently, no abdominal pain.  Last episode in 6/14 seems to have been related to an ileus. Continue non-surgical management at this point.  3. CAD: Nonobstructive CAD.  Continue ASA, statin.  4. COPD: Moderate, continue bronchodilators.  5. HTN: BP is controlled.   6. Hyperlipidemia: Check lipids today.   Followup  in 1 year.   Loralie Champagne 06/05/2015

## 2015-06-06 ENCOUNTER — Ambulatory Visit: Payer: Self-pay | Admitting: Cardiology

## 2015-06-07 ENCOUNTER — Other Ambulatory Visit: Payer: Self-pay | Admitting: *Deleted

## 2015-06-07 DIAGNOSIS — E785 Hyperlipidemia, unspecified: Secondary | ICD-10-CM

## 2015-06-07 MED ORDER — ATORVASTATIN CALCIUM 80 MG PO TABS: 80.0000 mg | ORAL_TABLET | Freq: Every day | ORAL | Status: DC

## 2015-06-10 ENCOUNTER — Other Ambulatory Visit: Payer: Self-pay | Admitting: Geriatric Medicine

## 2015-06-16 ENCOUNTER — Ambulatory Visit (HOSPITAL_COMMUNITY)
Admission: RE | Admit: 2015-06-16 | Discharge: 2015-06-16 | Disposition: A | Discharge status: Home / Self Care | Payer: Medicare Other | Source: Ambulatory Visit | Attending: Cardiology | Admitting: Cardiology

## 2015-06-16 DIAGNOSIS — I2510 Atherosclerotic heart disease of native coronary artery without angina pectoris: Secondary | ICD-10-CM | POA: Insufficient documentation

## 2015-06-16 DIAGNOSIS — F17200 Nicotine dependence, unspecified, uncomplicated: Secondary | ICD-10-CM | POA: Diagnosis not present

## 2015-06-16 DIAGNOSIS — I739 Peripheral vascular disease, unspecified: Secondary | ICD-10-CM | POA: Insufficient documentation

## 2015-06-16 DIAGNOSIS — E785 Hyperlipidemia, unspecified: Secondary | ICD-10-CM | POA: Insufficient documentation

## 2015-06-16 DIAGNOSIS — I10 Essential (primary) hypertension: Secondary | ICD-10-CM | POA: Insufficient documentation

## 2015-06-30 ENCOUNTER — Telehealth: Payer: Self-pay

## 2015-06-30 MED ORDER — LORAZEPAM 2 MG PO TABS: 2.0000 mg | ORAL_TABLET | Freq: Every evening | ORAL | Status: DC | PRN

## 2015-06-30 NOTE — Telephone Encounter (Signed)
Pt is requesting refill of Lorazepam 2 mg. Last refill was 8/12

## 2015-06-30 NOTE — Telephone Encounter (Signed)
Printed and signed, please fax.

## 2015-07-01 NOTE — Telephone Encounter (Signed)
Faxed to pharmacy

## 2015-07-13 ENCOUNTER — Telehealth: Payer: Self-pay | Admitting: *Deleted

## 2015-07-13 MED ORDER — DICLOFENAC SODIUM 75 MG PO TBEC: 75.0000 mg | DELAYED_RELEASE_TABLET | Freq: Two times a day (BID) | ORAL | Status: DC

## 2015-07-13 NOTE — Telephone Encounter (Signed)
Left msg on triage stating that [facility] has been trying to get refills on her diclofenac. Out of meds and needing refills today. Notified pt per chart no refill has came through pharmacy must send electronically for md to receive sent refills to [facility]...../lmb

## 2015-07-25 ENCOUNTER — Telehealth: Payer: Self-pay

## 2015-07-25 NOTE — Telephone Encounter (Signed)
Reviewed AWV and stated she had been to several doctors and has had blood work drawn, so does not have many needs at this time.  Overdue screens only have flu shot and Hep C screen and will take flu shot when she comes in. Will decline separate AWV this year and may come in next year.

## 2015-08-03 ENCOUNTER — Other Ambulatory Visit: Payer: Medicare Other | Admitting: *Deleted

## 2015-08-03 DIAGNOSIS — I739 Peripheral vascular disease, unspecified: Secondary | ICD-10-CM

## 2015-08-03 DIAGNOSIS — E785 Hyperlipidemia, unspecified: Secondary | ICD-10-CM

## 2015-08-03 DIAGNOSIS — I10 Essential (primary) hypertension: Secondary | ICD-10-CM

## 2015-08-03 LAB — HEPATIC FUNCTION PANEL
ALT: 26 U/L (ref 6–29)
AST: 28 U/L (ref 10–35)
Albumin: 4 g/dL (ref 3.6–5.1)
Alkaline Phosphatase: 76 U/L (ref 33–130)
Bilirubin, Direct: 0.1 mg/dL (ref <=0.2)
Indirect Bilirubin: 0.3 mg/dL (ref 0.2–1.2)
Total Bilirubin: 0.4 mg/dL (ref 0.2–1.2)
Total Protein: 7.2 g/dL (ref 6.1–8.1)

## 2015-08-03 LAB — LIPID PANEL
Cholesterol: 177 mg/dL (ref 125–200)
HDL: 93 mg/dL (ref >=46)
LDL Cholesterol: 65 mg/dL (ref <130)
Total CHOL/HDL Ratio: 1.9 Ratio (ref <=5.0)
Triglycerides: 93 mg/dL (ref <150)
VLDL: 19 mg/dL (ref <30)

## 2015-08-12 ENCOUNTER — Ambulatory Visit (INDEPENDENT_AMBULATORY_CARE_PROVIDER_SITE_OTHER): Payer: Medicare Other | Admitting: Internal Medicine

## 2015-08-12 ENCOUNTER — Encounter: Payer: Self-pay | Admitting: Internal Medicine

## 2015-08-12 ENCOUNTER — Other Ambulatory Visit: Payer: Self-pay | Admitting: Geriatric Medicine

## 2015-08-12 ENCOUNTER — Other Ambulatory Visit (INDEPENDENT_AMBULATORY_CARE_PROVIDER_SITE_OTHER): Payer: Medicare Other

## 2015-08-12 VITALS — BP 110/78 | HR 85 | Temp 97.9°F | Resp 18 | Ht 66.0 in | Wt 149.1 lb

## 2015-08-12 DIAGNOSIS — I10 Essential (primary) hypertension: Secondary | ICD-10-CM | POA: Diagnosis not present

## 2015-08-12 DIAGNOSIS — Z0000 Encounter for general adult medical examination without abnormal findings: Secondary | ICD-10-CM | POA: Insufficient documentation

## 2015-08-12 DIAGNOSIS — K551 Chronic vascular disorders of intestine: Secondary | ICD-10-CM

## 2015-08-12 DIAGNOSIS — Z23 Encounter for immunization: Secondary | ICD-10-CM | POA: Diagnosis not present

## 2015-08-12 DIAGNOSIS — E039 Hypothyroidism, unspecified: Secondary | ICD-10-CM

## 2015-08-12 DIAGNOSIS — J439 Emphysema, unspecified: Secondary | ICD-10-CM

## 2015-08-12 DIAGNOSIS — Z720 Tobacco use: Secondary | ICD-10-CM

## 2015-08-12 LAB — CBC
HCT: 46.9 % — ABNORMAL HIGH (ref 36.0–46.0)
Hemoglobin: 15.4 g/dL — ABNORMAL HIGH (ref 12.0–15.0)
MCHC: 32.9 g/dL (ref 30.0–36.0)
MCV: 90.9 fl (ref 78.0–100.0)
Platelets: 338 10*3/uL (ref 150.0–400.0)
RBC: 5.16 Mil/uL — ABNORMAL HIGH (ref 3.87–5.11)
RDW: 16.5 % — ABNORMAL HIGH (ref 11.5–15.5)
WBC: 7.2 10*3/uL (ref 4.0–10.5)

## 2015-08-12 LAB — COMPREHENSIVE METABOLIC PANEL
ALT: 25 U/L (ref 0–35)
AST: 21 U/L (ref 0–37)
Albumin: 4.4 g/dL (ref 3.5–5.2)
Alkaline Phosphatase: 86 U/L (ref 39–117)
BUN: 13 mg/dL (ref 6–23)
CO2: 29 mEq/L (ref 19–32)
Calcium: 9.7 mg/dL (ref 8.4–10.5)
Chloride: 103 mEq/L (ref 96–112)
Creatinine, Ser: 0.81 mg/dL (ref 0.40–1.20)
GFR: 74.14 mL/min (ref >60.00)
Glucose, Bld: 93 mg/dL (ref 70–99)
Potassium: 4.6 mEq/L (ref 3.5–5.1)
Sodium: 139 mEq/L (ref 135–145)
Total Bilirubin: 0.5 mg/dL (ref 0.2–1.2)
Total Protein: 7.9 g/dL (ref 6.0–8.3)

## 2015-08-12 LAB — TSH: TSH: 1.83 u[IU]/mL (ref 0.35–4.50)

## 2015-08-12 MED ORDER — LORAZEPAM 2 MG PO TABS: 2.0000 mg | ORAL_TABLET | Freq: Every evening | ORAL | Status: DC | PRN

## 2015-08-12 MED ORDER — TIOTROPIUM BROMIDE MONOHYDRATE 18 MCG IN CAPS: 18.0000 ug | ORAL_CAPSULE | Freq: Every day | RESPIRATORY_TRACT | Status: DC

## 2015-08-12 MED ORDER — FLUTICASONE-SALMETEROL 100-50 MCG/DOSE IN AEPB: 1.0000 | INHALATION_SPRAY | Freq: Two times a day (BID) | RESPIRATORY_TRACT | Status: DC

## 2015-08-12 MED ORDER — LEVOTHYROXINE SODIUM 112 MCG PO TABS: ORAL_TABLET | ORAL | Status: DC

## 2015-08-12 MED ORDER — AMLODIPINE BESYLATE 5 MG PO TABS: 5.0000 mg | ORAL_TABLET | Freq: Every day | ORAL | Status: DC

## 2015-08-12 MED ORDER — AMITRIPTYLINE HCL 50 MG PO TABS: 50.0000 mg | ORAL_TABLET | Freq: Every day | ORAL | Status: DC

## 2015-08-12 NOTE — Assessment & Plan Note (Signed)
BP at goal on amlodipine and metoprolol daily. Checking labs and adjust as needed.

## 2015-08-12 NOTE — Patient Instructions (Signed)
We have sent in your refills today and given you the flu and pneumonia shot.   We are checking the labs and will call you back with the results.   Health Maintenance, Female Adopting a healthy lifestyle and getting preventive care can go a long way to promote health and wellness. Talk with your health care provider about what schedule of regular examinations is right for you. This is a good chance for you to check in with your provider about disease prevention and staying healthy. In between checkups, there are plenty of things you can do on your own. Experts have done a lot of research about which lifestyle changes and preventive measures are most likely to keep you healthy. Ask your health care provider for more information. WEIGHT AND DIET  Eat a healthy diet  Be sure to include plenty of vegetables, fruits, low-fat dairy products, and lean protein.  Do not eat a lot of foods high in solid fats, added sugars, or salt.  Get regular exercise. This is one of the most important things you can do for your health.  Most adults should exercise for at least 150 minutes each week. The exercise should increase your heart rate and make you sweat (moderate-intensity exercise).  Most adults should also do strengthening exercises at least twice a week. This is in addition to the moderate-intensity exercise.  Maintain a healthy weight  Body mass index (BMI) is a measurement that can be used to identify possible weight problems. It estimates body fat based on height and weight. Your health care provider can help determine your BMI and help you achieve or maintain a healthy weight.  For females 1 years of age and older:   A BMI below 18.5 is considered underweight.  A BMI of 18.5 to 24.9 is normal.  A BMI of 25 to 29.9 is considered overweight.  A BMI of 30 and above is considered obese.  Watch levels of cholesterol and blood lipids  You should start having your blood tested for lipids and  cholesterol at 70 years of age, then have this test every 5 years.  You may need to have your cholesterol levels checked more often if:  Your lipid or cholesterol levels are high.  You are older than 70 years of age.  You are at high risk for heart disease.  CANCER SCREENING   Lung Cancer  Lung cancer screening is recommended for adults 92-38 years old who are at high risk for lung cancer because of a history of smoking.  A yearly low-dose CT scan of the lungs is recommended for people who:  Currently smoke.  Have quit within the past 15 years.  Have at least a 30-pack-year history of smoking. A pack year is smoking an average of one pack of cigarettes a day for 1 year.  Yearly screening should continue until it has been 15 years since you quit.  Yearly screening should stop if you develop a health problem that would prevent you from having lung cancer treatment.  Breast Cancer  Practice breast self-awareness. This means understanding how your breasts normally appear and feel.  It also means doing regular breast self-exams. Let your health care provider know about any changes, no matter how small.  If you are in your 20s or 30s, you should have a clinical breast exam (CBE) by a health care provider every 1-3 years as part of a regular health exam.  If you are 24 or older, have a CBE every  having a breast X-ray (mammogram) every year.  If you have a family history of breast cancer, talk to your health care provider about genetic screening.  If you are at high risk for breast cancer, talk to your health care provider about having an MRI and a mammogram every year.  Breast cancer gene (BRCA) assessment is recommended for women who have family members with BRCA-related cancers. BRCA-related cancers include:  Breast.  Ovarian.  Tubal.  Peritoneal cancers.  Results of the assessment will determine the need for genetic counseling and BRCA1 and BRCA2  testing. Cervical Cancer Your health care provider may recommend that you be screened regularly for cancer of the pelvic organs (ovaries, uterus, and vagina). This screening involves a pelvic examination, including checking for microscopic changes to the surface of your cervix (Pap test). You may be encouraged to have this screening done every 3 years, beginning at age 21.  For women ages 30-65, health care providers may recommend pelvic exams and Pap testing every 3 years, or they may recommend the Pap and pelvic exam, combined with testing for human papilloma virus (HPV), every 5 years. Some types of HPV increase your risk of cervical cancer. Testing for HPV may also be done on women of any age with unclear Pap test results.  Other health care providers may not recommend any screening for nonpregnant women who are considered low risk for pelvic cancer and who do not have symptoms. Ask your health care provider if a screening pelvic exam is right for you.  If you have had past treatment for cervical cancer or a condition that could lead to cancer, you need Pap tests and screening for cancer for at least 20 years after your treatment. If Pap tests have been discontinued, your risk factors (such as having a new sexual partner) need to be reassessed to determine if screening should resume. Some women have medical problems that increase the chance of getting cervical cancer. In these cases, your health care provider may recommend more frequent screening and Pap tests. Colorectal Cancer  This type of cancer can be detected and often prevented.  Routine colorectal cancer screening usually begins at 70 years of age and continues through 70 years of age.  Your health care provider may recommend screening at an earlier age if you have risk factors for colon cancer.  Your health care provider may also recommend using home test kits to check for hidden blood in the stool.  A small camera at the end of a  tube can be used to examine your colon directly (sigmoidoscopy or colonoscopy). This is done to check for the earliest forms of colorectal cancer.  Routine screening usually begins at age 50.  Direct examination of the colon should be repeated every 5-10 years through 70 years of age. However, you may need to be screened more often if early forms of precancerous polyps or small growths are found. Skin Cancer  Check your skin from head to toe regularly.  Tell your health care provider about any new moles or changes in moles, especially if there is a change in a mole's shape or color.  Also tell your health care provider if you have a mole that is larger than the size of a pencil eraser.  Always use sunscreen. Apply sunscreen liberally and repeatedly throughout the day.  Protect yourself by wearing long sleeves, pants, a wide-brimmed hat, and sunglasses whenever you are outside. HEART DISEASE, DIABETES, AND HIGH BLOOD PRESSURE   High blood   High blood pressure causes heart disease and increases the risk of stroke. High blood pressure is more likely to develop in:  People who have blood pressure in the high end of the normal range (130-139/85-89 mm Hg).  People who are overweight or obese.  People who are African American.  If you are 18-39 years of age, have your blood pressure checked every 3-5 years. If you are 40 years of age or older, have your blood pressure checked every year. You should have your blood pressure measured twice--once when you are at a hospital or clinic, and once when you are not at a hospital or clinic. Record the average of the two measurements. To check your blood pressure when you are not at a hospital or clinic, you can use:  An automated blood pressure machine at a pharmacy.  A home blood pressure monitor.  If you are between 55 years and 79 years old, ask your health care provider if you should take aspirin to prevent strokes.  Have regular diabetes screenings. This  involves taking a blood sample to check your fasting blood sugar level.  If you are at a normal weight and have a low risk for diabetes, have this test once every three years after 70 years of age.  If you are overweight and have a high risk for diabetes, consider being tested at a younger age or more often. PREVENTING INFECTION  Hepatitis B  If you have a higher risk for hepatitis B, you should be screened for this virus. You are considered at high risk for hepatitis B if:  You were born in a country where hepatitis B is common. Ask your health care provider which countries are considered high risk.  Your parents were born in a high-risk country, and you have not been immunized against hepatitis B (hepatitis B vaccine).  You have HIV or AIDS.  You use needles to inject street drugs.  You live with someone who has hepatitis B.  You have had sex with someone who has hepatitis B.  You get hemodialysis treatment.  You take certain medicines for conditions, including cancer, organ transplantation, and autoimmune conditions. Hepatitis C  Blood testing is recommended for:  Everyone born from 1945 through 1965.  Anyone with known risk factors for hepatitis C. Sexually transmitted infections (STIs)  You should be screened for sexually transmitted infections (STIs) including gonorrhea and chlamydia if:  You are sexually active and are younger than 70 years of age.  You are older than 70 years of age and your health care provider tells you that you are at risk for this type of infection.  Your sexual activity has changed since you were last screened and you are at an increased risk for chlamydia or gonorrhea. Ask your health care provider if you are at risk.  If you do not have HIV, but are at risk, it may be recommended that you take a prescription medicine daily to prevent HIV infection. This is called pre-exposure prophylaxis (PrEP). You are considered at risk if:  You are  sexually active and do not regularly use condoms or know the HIV status of your partner(s).  You take drugs by injection.  You are sexually active with a partner who has HIV. Talk with your health care provider about whether you are at high risk of being infected with HIV. If you choose to begin PrEP, you should first be tested for HIV. You should then be tested every 3 months for as   long as you are taking PrEP.  PREGNANCY   If you are premenopausal and you may become pregnant, ask your health care provider about preconception counseling.  If you may become pregnant, take 400 to 800 micrograms (mcg) of folic acid every day.  If you want to prevent pregnancy, talk to your health care provider about birth control (contraception). OSTEOPOROSIS AND MENOPAUSE   Osteoporosis is a disease in which the bones lose minerals and strength with aging. This can result in serious bone fractures. Your risk for osteoporosis can be identified using a bone density scan.  If you are 27 years of age or older, or if you are at risk for osteoporosis and fractures, ask your health care provider if you should be screened.  Ask your health care provider whether you should take a calcium or vitamin D supplement to lower your risk for osteoporosis.  Menopause may have certain physical symptoms and risks.  Hormone replacement therapy may reduce some of these symptoms and risks. Talk to your health care provider about whether hormone replacement therapy is right for you.  HOME CARE INSTRUCTIONS   Schedule regular health, dental, and eye exams.  Stay current with your immunizations.   Do not use any tobacco products including cigarettes, chewing tobacco, or electronic cigarettes.  If you are pregnant, do not drink alcohol.  If you are breastfeeding, limit how much and how often you drink alcohol.  Limit alcohol intake to no more than 1 drink per day for nonpregnant women. One drink equals 12 ounces of beer, 5  ounces of wine, or 1 ounces of hard liquor.  Do not use street drugs.  Do not share needles.  Ask your health care provider for help if you need support or information about quitting drugs.  Tell your health care provider if you often feel depressed.  Tell your health care provider if you have ever been abused or do not feel safe at home.   This information is not intended to replace advice given to you by your health care provider. Make sure you discuss any questions you have with your health care provider.   Document Released: 04/16/2011 Document Revised: 10/22/2014 Document Reviewed: 09/02/2013 Elsevier Interactive Patient Education [employer].

## 2015-08-12 NOTE — Assessment & Plan Note (Signed)
Some scattered wheezing on exam today as she does not use advair daily due to cost. No SOB and no flare today. No indications for steroids or antibiotics.

## 2015-08-12 NOTE — Assessment & Plan Note (Signed)
Given flu and pneumonia shot to update her immunizations. Recently finished 5 year anti-estrogen breast cancer therapy and needs yearly mammogram. Colonoscopy and bone density up to date. Screening for hep c today. Counseled her about smoking and the need to stay off cigarettes. 10 year screening recommendations given to her at visit.

## 2015-08-12 NOTE — Progress Notes (Signed)
   Subjective:    Patient ID: [name], female    DOB: July 11, 1946, 69 y.o.   MRN: [id number]  HPI Here for medicare wellness, no new complaints. Please see A/P for status and treatment of chronic medical problems.   Diet: heart healthy Physical activity: sedentary Depression/mood screen: negative Hearing: mild loss, corrects with hearing aids Visual acuity: grossly normal with lens, performs annual eye exam  ADLs: capable Fall risk: none Home safety: good Cognitive evaluation: intact to orientation, naming, recall and repetition EOL planning: adv directives discussed  I have personally reviewed and have noted 1. The patient's medical and social history - reviewed today no changes 2. Their use of alcohol, tobacco or illicit drugs 3. Their current medications and supplements 4. The patient's functional ability including ADL's, fall risks, home safety risks and hearing or visual impairment. 5. Diet and physical activities 6. Evidence for depression or mood disorders 7. Care team reviewed and updated (available in snapshot)  Review of Systems  Constitutional: Negative for fever, activity change, fatigue and unexpected weight change.  Respiratory: Negative for chest tightness, shortness of breath and wheezing.   Cardiovascular: Negative for chest pain, palpitations and leg swelling.  Gastrointestinal: Negative for vomiting, abdominal pain, diarrhea, constipation and abdominal distention.  Musculoskeletal: Positive for myalgias. Negative for back pain and gait problem.  Neurological: Negative for dizziness, weakness, light-headedness and headaches.  Psychiatric/Behavioral: Negative for sleep disturbance.      Objective:   Physical Exam  Constitutional: She is oriented to person, place, and time. She appears well-developed and well-nourished.  HENT:  Head: Normocephalic and atraumatic.  Eyes: EOM are normal.  Neck: Normal range of motion.  Cardiovascular: Normal rate and  regular rhythm.   Pulmonary/Chest: Effort normal. No respiratory distress. She has no rales. She exhibits no tenderness.  Abdominal: Soft. Bowel sounds are normal. She exhibits no distension. There is no tenderness. There is no rebound.  Neurological: She is alert and oriented to person, place, and time. Coordination normal.  Skin: Skin is warm and dry.   Filed Vitals:   08/12/15 1307  BP: 110/78  Pulse: 85  Temp: 97.9 F (36.6 C)  TempSrc: Oral  Resp: 18  Height: '5\' 6"'$  (1.676 m)  Weight: 149 lb 1.9 oz (67.64 kg)  SpO2: 92%      Assessment & Plan:  Pneumonia 23 and flu shot given at visit.

## 2015-08-12 NOTE — Progress Notes (Signed)
Pre visit review using our clinic review tool, if applicable. No additional management support is needed unless otherwise documented below in the visit note. 

## 2015-08-12 NOTE — Assessment & Plan Note (Signed)
Some recent lapse and talked to her about the need to abstain given her severe PAD and CAD and COPD. She understands and will abstain.

## 2015-08-12 NOTE — Assessment & Plan Note (Signed)
Stable symptoms. No progression suspected.

## 2015-08-16 ENCOUNTER — Telehealth: Payer: Self-pay | Admitting: *Deleted

## 2015-08-16 MED ORDER — AMLODIPINE BESYLATE 5 MG PO TABS: 5.0000 mg | ORAL_TABLET | Freq: Every day | ORAL | Status: DC

## 2015-08-16 MED ORDER — LEVOTHYROXINE SODIUM 112 MCG PO TABS: ORAL_TABLET | ORAL | Status: DC

## 2015-08-16 MED ORDER — OMEPRAZOLE 40 MG PO CPDR: 40.0000 mg | DELAYED_RELEASE_CAPSULE | Freq: Two times a day (BID) | ORAL | Status: DC

## 2015-08-16 MED ORDER — METOPROLOL TARTRATE 25 MG PO TABS: 25.0000 mg | ORAL_TABLET | Freq: Two times a day (BID) | ORAL | Status: DC

## 2015-08-16 NOTE — Telephone Encounter (Signed)
Receive call pt states she has been trying to get refills on her medications & pharmacist stated they have sent request on 3 different occasions. Verified which med pt is needing. Inform her the Levothyroxine & amlodipine was sent on 10/28/016. Inform pt will resend plus the metoprolol & omeprazole to [facility].../lmb

## 2015-10-05 ENCOUNTER — Other Ambulatory Visit: Payer: Self-pay | Admitting: Cardiology

## 2016-01-28 ENCOUNTER — Other Ambulatory Visit: Payer: Self-pay | Admitting: Cardiology

## 2016-01-31 ENCOUNTER — Other Ambulatory Visit: Payer: Self-pay | Admitting: Cardiology

## 2016-02-22 ENCOUNTER — Other Ambulatory Visit: Payer: Self-pay

## 2016-02-22 MED ORDER — LORAZEPAM 2 MG PO TABS: 2.0000 mg | ORAL_TABLET | Freq: Every evening | ORAL | Status: DC | PRN

## 2016-02-22 NOTE — Telephone Encounter (Signed)
Faxed script [facility] to [facility].../lmb 

## 2016-02-22 NOTE — Telephone Encounter (Signed)
Ativan refill is rq - please advise in PCP absence.

## 2016-02-22 NOTE — Telephone Encounter (Signed)
OK to fill this prescription with additional refills x0 Sch OV w/Dr Sharlet Salina Thank you!

## 2016-02-22 NOTE — Telephone Encounter (Signed)
Received call from Chris/pharmacist stating sent over request for pt lorazepam. Pt has call them for status. Inform pharmacy we did get refill request, but pt PCP is on vacation. Request has been routed to another provider for authorization. Dr. Camila Li is this ok for renewal../lmb

## 2016-03-21 ENCOUNTER — Other Ambulatory Visit: Payer: Self-pay | Admitting: Internal Medicine

## 2016-05-04 ENCOUNTER — Encounter (HOSPITAL_COMMUNITY): Payer: Self-pay | Admitting: Emergency Medicine

## 2016-05-04 ENCOUNTER — Encounter (HOSPITAL_COMMUNITY): Payer: Self-pay | Admitting: *Deleted

## 2016-05-04 ENCOUNTER — Other Ambulatory Visit: Payer: Self-pay

## 2016-05-04 ENCOUNTER — Inpatient Hospital Stay (HOSPITAL_COMMUNITY)
Admission: EM | Admit: 2016-05-04 | Discharge: 2016-05-07 | DRG: 190 | Disposition: A | Discharge status: Home / Self Care | Payer: Medicare Other | Attending: Internal Medicine | Admitting: Internal Medicine

## 2016-05-04 ENCOUNTER — Ambulatory Visit (INDEPENDENT_AMBULATORY_CARE_PROVIDER_SITE_OTHER): Payer: Medicare Other

## 2016-05-04 ENCOUNTER — Ambulatory Visit (HOSPITAL_COMMUNITY)
Admission: EM | Admit: 2016-05-04 | Discharge: 2016-05-04 | Disposition: A | Discharge status: Nursing Home (Includes ICF) | Payer: Medicare Other | Attending: Family Medicine | Admitting: Family Medicine

## 2016-05-04 ENCOUNTER — Observation Stay (HOSPITAL_COMMUNITY): Payer: Medicare Other

## 2016-05-04 DIAGNOSIS — D72829 Elevated white blood cell count, unspecified: Secondary | ICD-10-CM | POA: Diagnosis not present

## 2016-05-04 DIAGNOSIS — Z853 Personal history of malignant neoplasm of breast: Secondary | ICD-10-CM

## 2016-05-04 DIAGNOSIS — H9193 Unspecified hearing loss, bilateral: Secondary | ICD-10-CM | POA: Diagnosis present

## 2016-05-04 DIAGNOSIS — Z7982 Long term (current) use of aspirin: Secondary | ICD-10-CM

## 2016-05-04 DIAGNOSIS — M810 Age-related osteoporosis without current pathological fracture: Secondary | ICD-10-CM | POA: Diagnosis present

## 2016-05-04 DIAGNOSIS — E785 Hyperlipidemia, unspecified: Secondary | ICD-10-CM | POA: Diagnosis present

## 2016-05-04 DIAGNOSIS — F17210 Nicotine dependence, cigarettes, uncomplicated: Secondary | ICD-10-CM | POA: Diagnosis present

## 2016-05-04 DIAGNOSIS — M797 Fibromyalgia: Secondary | ICD-10-CM | POA: Diagnosis present

## 2016-05-04 DIAGNOSIS — I493 Ventricular premature depolarization: Secondary | ICD-10-CM | POA: Diagnosis present

## 2016-05-04 DIAGNOSIS — J439 Emphysema, unspecified: Secondary | ICD-10-CM

## 2016-05-04 DIAGNOSIS — J189 Pneumonia, unspecified organism: Secondary | ICD-10-CM

## 2016-05-04 DIAGNOSIS — K219 Gastro-esophageal reflux disease without esophagitis: Secondary | ICD-10-CM | POA: Diagnosis present

## 2016-05-04 DIAGNOSIS — R079 Chest pain, unspecified: Secondary | ICD-10-CM | POA: Diagnosis not present

## 2016-05-04 DIAGNOSIS — J440 Chronic obstructive pulmonary disease with acute lower respiratory infection: Secondary | ICD-10-CM | POA: Diagnosis present

## 2016-05-04 DIAGNOSIS — R945 Abnormal results of liver function studies: Secondary | ICD-10-CM

## 2016-05-04 DIAGNOSIS — Z720 Tobacco use: Secondary | ICD-10-CM | POA: Diagnosis present

## 2016-05-04 DIAGNOSIS — F17200 Nicotine dependence, unspecified, uncomplicated: Secondary | ICD-10-CM | POA: Diagnosis present

## 2016-05-04 DIAGNOSIS — I10 Essential (primary) hypertension: Secondary | ICD-10-CM | POA: Diagnosis present

## 2016-05-04 DIAGNOSIS — Z79810 Long term (current) use of selective estrogen receptor modulators (SERMs): Secondary | ICD-10-CM

## 2016-05-04 DIAGNOSIS — Z7902 Long term (current) use of antithrombotics/antiplatelets: Secondary | ICD-10-CM

## 2016-05-04 DIAGNOSIS — E860 Dehydration: Secondary | ICD-10-CM | POA: Diagnosis not present

## 2016-05-04 DIAGNOSIS — R9389 Abnormal findings on diagnostic imaging of other specified body structures: Secondary | ICD-10-CM

## 2016-05-04 DIAGNOSIS — I739 Peripheral vascular disease, unspecified: Secondary | ICD-10-CM | POA: Diagnosis present

## 2016-05-04 DIAGNOSIS — E039 Hypothyroidism, unspecified: Secondary | ICD-10-CM | POA: Diagnosis present

## 2016-05-04 DIAGNOSIS — R7989 Other specified abnormal findings of blood chemistry: Secondary | ICD-10-CM

## 2016-05-04 DIAGNOSIS — J441 Chronic obstructive pulmonary disease with (acute) exacerbation: Secondary | ICD-10-CM | POA: Diagnosis not present

## 2016-05-04 DIAGNOSIS — R938 Abnormal findings on diagnostic imaging of other specified body structures: Secondary | ICD-10-CM

## 2016-05-04 LAB — CBC WITH DIFFERENTIAL/PLATELET
Basophils Absolute: 0 10*3/uL (ref 0.0–0.1)
Basophils Relative: 0 %
Eosinophils Absolute: 0.1 10*3/uL (ref 0.0–0.7)
Eosinophils Relative: 1 %
HCT: 44.9 % (ref 36.0–46.0)
Hemoglobin: 15.2 g/dL — ABNORMAL HIGH (ref 12.0–15.0)
Lymphocytes Relative: 7 %
Lymphs Abs: 1 10*3/uL (ref 0.7–4.0)
MCH: 31.2 pg (ref 26.0–34.0)
MCHC: 33.9 g/dL (ref 30.0–36.0)
MCV: 92.2 fL (ref 78.0–100.0)
Monocytes Absolute: 1.6 10*3/uL — ABNORMAL HIGH (ref 0.1–1.0)
Monocytes Relative: 11 %
Neutro Abs: 12 10*3/uL — ABNORMAL HIGH (ref 1.7–7.7)
Neutrophils Relative %: 81 %
Platelets: 477 10*3/uL — ABNORMAL HIGH (ref 150–400)
RBC: 4.87 MIL/uL (ref 3.87–5.11)
RDW: 14 % (ref 11.5–15.5)
WBC: 14.3 10*3/uL — ABNORMAL HIGH (ref 4.0–10.5)

## 2016-05-04 LAB — STREP PNEUMONIAE URINARY ANTIGEN: Strep Pneumo Urinary Antigen: NEGATIVE

## 2016-05-04 LAB — COMPREHENSIVE METABOLIC PANEL
ALT: 122 U/L — ABNORMAL HIGH (ref 14–54)
AST: 101 U/L — ABNORMAL HIGH (ref 15–41)
Albumin: 3.3 g/dL — ABNORMAL LOW (ref 3.5–5.0)
Alkaline Phosphatase: 174 U/L — ABNORMAL HIGH (ref 38–126)
Anion gap: 11 (ref 5–15)
BUN: 12 mg/dL (ref 6–20)
CO2: 22 mmol/L (ref 22–32)
Calcium: 9.6 mg/dL (ref 8.9–10.3)
Chloride: 101 mmol/L (ref 101–111)
Creatinine, Ser: 0.91 mg/dL (ref 0.44–1.00)
GFR calc Af Amer: >60 mL/min (ref >60)
GFR calc non Af Amer: >60 mL/min (ref >60)
Glucose, Bld: 113 mg/dL — ABNORMAL HIGH (ref 65–99)
Potassium: 4.2 mmol/L (ref 3.5–5.1)
Sodium: 134 mmol/L — ABNORMAL LOW (ref 135–145)
Total Bilirubin: 0.5 mg/dL (ref 0.3–1.2)
Total Protein: 8 g/dL (ref 6.5–8.1)

## 2016-05-04 LAB — I-STAT CG4 LACTIC ACID, ED: Lactic Acid, Venous: 1.82 mmol/L (ref 0.5–1.9)

## 2016-05-04 MED ORDER — LORAZEPAM 1 MG PO TABS
1.0000 mg | ORAL_TABLET | Freq: Every evening | ORAL | Status: DC | PRN
  Administered 2016-05-04 – 2016-05-06 (×3): 1 mg via ORAL
  Filled 2016-05-04 (×3): qty 1

## 2016-05-04 MED ORDER — DEXTROSE 5 % IV SOLN
1.0000 g | Freq: Once | INTRAVENOUS | Status: AC
  Administered 2016-05-04: 1 g via INTRAVENOUS
  Filled 2016-05-04: qty 10

## 2016-05-04 MED ORDER — HYDROCODONE-ACETAMINOPHEN 5-325 MG PO TABS
1.0000 | ORAL_TABLET | ORAL | Status: DC | PRN
  Administered 2016-05-05 – 2016-05-07 (×3): 1 via ORAL
  Filled 2016-05-04 (×3): qty 1

## 2016-05-04 MED ORDER — IPRATROPIUM-ALBUTEROL 0.5-2.5 (3) MG/3ML IN SOLN
3.0000 mL | Freq: Four times a day (QID) | RESPIRATORY_TRACT | Status: DC
  Administered 2016-05-04 – 2016-05-05 (×2): 3 mL via RESPIRATORY_TRACT
  Filled 2016-05-04 (×2): qty 3

## 2016-05-04 MED ORDER — IPRATROPIUM-ALBUTEROL 0.5-2.5 (3) MG/3ML IN SOLN
3.0000 mL | RESPIRATORY_TRACT | Status: DC | PRN
Start: 2016-05-04 — End: 2016-05-07

## 2016-05-04 MED ORDER — IPRATROPIUM-ALBUTEROL 0.5-2.5 (3) MG/3ML IN SOLN
3.0000 mL | Freq: Once | RESPIRATORY_TRACT | Status: AC
  Administered 2016-05-04: 3 mL via RESPIRATORY_TRACT
  Filled 2016-05-04: qty 3

## 2016-05-04 MED ORDER — PANTOPRAZOLE SODIUM 40 MG PO TBEC
40.0000 mg | DELAYED_RELEASE_TABLET | Freq: Two times a day (BID) | ORAL | Status: DC
  Administered 2016-05-04 – 2016-05-07 (×6): 40 mg via ORAL
  Filled 2016-05-04 (×6): qty 1

## 2016-05-04 MED ORDER — ASPIRIN EC 81 MG PO TBEC
81.0000 mg | DELAYED_RELEASE_TABLET | Freq: Every day | ORAL | Status: DC
Start: 2016-05-04 — End: 2016-05-07
  Administered 2016-05-04 – 2016-05-07 (×4): 81 mg via ORAL
  Filled 2016-05-04 (×4): qty 1

## 2016-05-04 MED ORDER — ACETAMINOPHEN 325 MG PO TABS
650.0000 mg | ORAL_TABLET | Freq: Once | ORAL | Status: AC
  Administered 2016-05-04: 650 mg via ORAL
  Filled 2016-05-04: qty 2

## 2016-05-04 MED ORDER — ATORVASTATIN CALCIUM 80 MG PO TABS
80.0000 mg | ORAL_TABLET | Freq: Every day | ORAL | Status: DC
  Administered 2016-05-05 – 2016-05-06 (×2): 80 mg via ORAL
  Filled 2016-05-04 (×2): qty 1

## 2016-05-04 MED ORDER — ACETAMINOPHEN 325 MG PO TABS
650.0000 mg | ORAL_TABLET | Freq: Four times a day (QID) | ORAL | Status: DC | PRN
  Administered 2016-05-05: 650 mg via ORAL
  Filled 2016-05-04: qty 2

## 2016-05-04 MED ORDER — KETOROLAC TROMETHAMINE 15 MG/ML IJ SOLN
15.0000 mg | Freq: Once | INTRAMUSCULAR | Status: AC
  Administered 2016-05-04: 15 mg via INTRAVENOUS
  Filled 2016-05-04: qty 1

## 2016-05-04 MED ORDER — SODIUM CHLORIDE 0.9 % IV SOLN
INTRAVENOUS | Status: AC
  Administered 2016-05-04: 21:00:00 via INTRAVENOUS

## 2016-05-04 MED ORDER — AMLODIPINE BESYLATE 5 MG PO TABS
5.0000 mg | ORAL_TABLET | Freq: Every day | ORAL | Status: DC
Start: 2016-05-04 — End: 2016-05-07
  Administered 2016-05-04 – 2016-05-07 (×3): 5 mg via ORAL
  Filled 2016-05-04 (×4): qty 1

## 2016-05-04 MED ORDER — DEXTROSE 5 % IV SOLN
1.0000 g | INTRAVENOUS | Status: DC
  Administered 2016-05-05 – 2016-05-06 (×2): 1 g via INTRAVENOUS
  Filled 2016-05-04 (×3): qty 10

## 2016-05-04 MED ORDER — SODIUM CHLORIDE 0.9 % IV BOLUS (SEPSIS)
1000.0000 mL | Freq: Once | INTRAVENOUS | Status: AC
  Administered 2016-05-04: 1000 mL via INTRAVENOUS

## 2016-05-04 MED ORDER — CILOSTAZOL 100 MG PO TABS
100.0000 mg | ORAL_TABLET | Freq: Two times a day (BID) | ORAL | Status: DC
  Administered 2016-05-04 – 2016-05-07 (×6): 100 mg via ORAL
  Filled 2016-05-04 (×7): qty 1

## 2016-05-04 MED ORDER — GUAIFENESIN ER 600 MG PO TB12
600.0000 mg | ORAL_TABLET | Freq: Two times a day (BID) | ORAL | Status: DC
  Administered 2016-05-04 – 2016-05-06 (×4): 600 mg via ORAL
  Filled 2016-05-04 (×4): qty 1

## 2016-05-04 MED ORDER — METOPROLOL TARTRATE 25 MG PO TABS
25.0000 mg | ORAL_TABLET | Freq: Two times a day (BID) | ORAL | Status: DC
  Administered 2016-05-04 – 2016-05-07 (×4): 25 mg via ORAL
  Filled 2016-05-04 (×6): qty 1

## 2016-05-04 MED ORDER — METHYLPREDNISOLONE SODIUM SUCC 125 MG IJ SOLR
60.0000 mg | Freq: Four times a day (QID) | INTRAMUSCULAR | Status: DC
  Administered 2016-05-04 – 2016-05-06 (×8): 60 mg via INTRAVENOUS
  Filled 2016-05-04 (×8): qty 2

## 2016-05-04 MED ORDER — KETOROLAC TROMETHAMINE 15 MG/ML IJ SOLN
15.0000 mg | Freq: Four times a day (QID) | INTRAMUSCULAR | Status: AC
  Administered 2016-05-04 – 2016-05-05 (×2): 15 mg via INTRAVENOUS
  Filled 2016-05-04 (×3): qty 1

## 2016-05-04 MED ORDER — ENOXAPARIN SODIUM 40 MG/0.4ML ~~LOC~~ SOLN
40.0000 mg | SUBCUTANEOUS | Status: DC
  Administered 2016-05-04 – 2016-05-06 (×3): 40 mg via SUBCUTANEOUS
  Filled 2016-05-04 (×3): qty 0.4

## 2016-05-04 MED ORDER — METHYLPREDNISOLONE SODIUM SUCC 125 MG IJ SOLR
125.0000 mg | Freq: Once | INTRAMUSCULAR | Status: AC
  Administered 2016-05-04: 125 mg via INTRAVENOUS
  Filled 2016-05-04: qty 2

## 2016-05-04 MED ORDER — DEXTROSE 5 % IV SOLN
500.0000 mg | INTRAVENOUS | Status: DC
  Administered 2016-05-05: 500 mg via INTRAVENOUS
  Filled 2016-05-04: qty 500

## 2016-05-04 MED ORDER — MOMETASONE FURO-FORMOTEROL FUM 100-5 MCG/ACT IN AERO
2.0000 | INHALATION_SPRAY | Freq: Two times a day (BID) | RESPIRATORY_TRACT | Status: DC
  Administered 2016-05-05 – 2016-05-07 (×5): 2 via RESPIRATORY_TRACT
  Filled 2016-05-04: qty 8.8

## 2016-05-04 MED ORDER — LEVOTHYROXINE SODIUM 112 MCG PO TABS
112.0000 ug | ORAL_TABLET | Freq: Every day | ORAL | Status: DC
  Administered 2016-05-05 – 2016-05-07 (×3): 112 ug via ORAL
  Filled 2016-05-04 (×3): qty 1

## 2016-05-04 MED ORDER — DEXTROSE 5 % IV SOLN
500.0000 mg | Freq: Once | INTRAVENOUS | Status: AC
  Administered 2016-05-04: 500 mg via INTRAVENOUS
  Filled 2016-05-04: qty 500

## 2016-05-04 MED ORDER — AMITRIPTYLINE HCL 50 MG PO TABS
50.0000 mg | ORAL_TABLET | Freq: Every day | ORAL | Status: DC
  Administered 2016-05-04 – 2016-05-06 (×3): 50 mg via ORAL
  Filled 2016-05-04 (×3): qty 1

## 2016-05-04 MED ORDER — IOPAMIDOL (ISOVUE-370) INJECTION 76%
INTRAVENOUS | Status: AC
  Administered 2016-05-04: 100 mL
  Filled 2016-05-04: qty 100

## 2016-05-04 NOTE — ED Notes (Signed)
Dinner tray ordered at this time.

## 2016-05-04 NOTE — ED Provider Notes (Addendum)
CSN: [id number]     Arrival date & time 05/04/16  1340 History   First MD Initiated Contact with Patient 05/04/16 1451     Chief Complaint  Patient presents with  . Chest Pain  . Cough  . Fever  . Pneumonia     (Consider location/radiation/quality/duration/timing/severity/associated sxs/prior Treatment) HPI  70 year old female presents with a cough for 1 week. She went to urgent care was diagnosed with pneumonia and sent here for evaluation. Has a history of COPD but states she is more short of breath currently. Has been having yellow sputum. No fevers until she checked her temperature at urgent care and it was 100.6. Patient has chest pain when coughing. No hemoptysis or leg pain/swelling. Patient has been having wheezing. Patient has been feeling diffusely weak.  Past Medical History  Diagnosis Date  . COPD (chronic obstructive pulmonary disease) ([place])   . GERD (gastroesophageal reflux disease)   . Hypertension   . Diverticulitis     required colcectomy  . Osteoporosis   . Hyperlipidemia   . Emphysema of lung ([place])   . Cancer ([place]) 2010    invasive ductal breast cancer; on tamoxifen  . Fibromyalgia 1995  . Hx of colonic polyps     Dr. Cristina Gong -last study '11  . Peripheral vascular disease [facility]) May '12 - CT angio    SMA chronic occlusion; stenosis of celiac trunk.  . Hypothyroidism   . Hearing loss of both ears     uses hearing aids  bilaterally  . Varicose veins of leg with swelling     varicose vein surgery - Dr. Aleda Grana  . Complication of anesthesia     was in ICU after lung surgery 2012  . Wears glasses    Past Surgical History  Procedure Laterality Date  . Appendectomy    . Wedge resection  1993    right upper lobe of lung as part of VAT-nodule/benign  . Colonoscopy w/ polypectomy    . Abdominal hysterectomy  1973    metorrhagia  . Colectomy  2002    sigmoid  . Cardiac catheterization  '11    no obstructive coronary disease  . Breast surgery  2010   mastectomy with reconstructive surgery  . Breast surgery  2010    rt lump x2  . Breast surgery  2010    partial rt br reduction  . Lung surgery  9/12    rt mid lung wedge  . Partial mastectomy with needle localization Left 02/23/2013    Procedure:  LEFT PARTIAL MASTECTOMY WITH NEEDLE LOCALIZATION;  Surgeon: Adin Hector, MD;  Location: [place];  Service: General;  Laterality: Left;   Family History  Problem Relation Age of Onset  . Colon cancer Mother     colon  . COPD Mother     brown lung  . Hypertension Mother   . Diabetes Mother   . Emphysema Mother   . Coronary artery disease Father   . Heart attack Father   . Sudden death Father   . Heart disease Father   . Cancer Sister     fallopian tube  . Hypertension Sister   . Coronary artery disease Brother   . Throat cancer Sister   . Melanoma Sister   . Hypertension Sister   . Pancreatic cancer Sister   . Cancer Sister   . Heart attack Brother     early 54's   Social History  Substance Use Topics  . Smoking status:  Current Every Day Smoker -- 0.75 packs/day    Types: Cigarettes    Last Attempt to Quit: 03/18/2012  . Smokeless tobacco: Never Used     Comment: smokes less   . Alcohol Use: 6.0 oz/week    10 Cans of beer per week     Comment: occ   OB History    No data available     Review of Systems  Constitutional: Positive for fever.  Respiratory: Positive for cough, shortness of breath and wheezing.   Cardiovascular: Positive for chest pain (when coughing). Negative for leg swelling.  Neurological: Positive for weakness.  All other systems reviewed and are negative.     Allergies  Sulfonamide derivatives  Home Medications   Prior to Admission medications   Medication Sig Start Date End Date Taking? Authorizing Provider  albuterol (PROVENTIL HFA;VENTOLIN HFA) 108 (90 BASE) MCG/ACT inhaler Inhale 2 puffs into the lungs every 6 (six) hours as needed for wheezing. As a RESCUE  medication 08/10/14   Hoyt Koch, MD  Albuterol Sulfate (PROAIR RESPICLICK) 253 (90 BASE) MCG/ACT AEPB Inhale 1-2 puffs into the lungs every 6 (six) hours as needed. 03/02/15   Hoyt Koch, MD  amitriptyline (ELAVIL) 50 MG tablet Take 1 tablet (50 mg total) by mouth at bedtime. 08/12/15   Hoyt Koch, MD  amLODipine (NORVASC) 5 MG tablet Take 1 tablet (5 mg total) by mouth daily. 08/16/15   Hoyt Koch, MD  aspirin 81 MG tablet Take 81 mg by mouth daily.     Historical Provider, MD  atorvastatin (LIPITOR) 80 MG tablet TAKE 1 TABLET (80 MG TOTAL) BY MOUTH DAILY. 01/30/16   Larey Dresser, MD  B Complex Vitamins (VITAMIN-B COMPLEX PO) Take 1 tablet by mouth daily.     Historical Provider, MD  bisacodyl (DULCOLAX) 5 MG EC tablet Take 1 tablet (5 mg total) by mouth daily as needed for moderate constipation. 08/10/14   Hoyt Koch, MD  Calcium Carbonate-Vitamin D (CALCIUM + D PO) Take 600 mg by mouth daily.     Historical Provider, MD  cilostazol (PLETAL) 100 MG tablet Take 1 tablet (100 mg total) by mouth 2 (two) times daily. 06/03/15   Larey Dresser, MD  diclofenac (VOLTAREN) 75 MG EC tablet Take 1 tablet (75 mg total) by mouth 2 (two) times daily. 07/13/15   Hoyt Koch, MD  Fluticasone-Salmeterol (ADVAIR DISKUS) 100-50 MCG/DOSE AEPB Inhale 1 puff into the lungs 2 (two) times daily. 08/12/15   Hoyt Koch, MD  levothyroxine (SYNTHROID, LEVOTHROID) 112 MCG tablet TAKE ONE TABLET BY MOUTH ONE TIME DAILY 08/16/15   Hoyt Koch, MD  LORazepam (ATIVAN) 2 MG tablet TAKE 1 TABLET BY MOUTH AT BEDTIME AS NEEDED FOR ANXIETY 03/22/16   Hoyt Koch, MD  metoCLOPramide (REGLAN) 10 MG tablet TAKE ONE TABLET BY MOUTH EVERY SIX HOURS AS NEEDED FOR NAUSEA Patient not taking: Reported on 08/12/2015 02/14/15   Chauncey Cruel, MD  metoprolol tartrate (LOPRESSOR) 25 MG tablet Take 1 tablet (25 mg total) by mouth 2 (two) times daily. 08/16/15    Hoyt Koch, MD  omeprazole (PRILOSEC) 40 MG capsule Take 1 capsule (40 mg total) by mouth 2 (two) times daily. 08/16/15   Hoyt Koch, MD  tiotropium (SPIRIVA HANDIHALER) 18 MCG inhalation capsule Place 1 capsule (18 mcg total) into inhaler and inhale daily. 08/12/15   Hoyt Koch, MD   BP 128/78 mmHg  Pulse 118  Temp(Src)  99 F (37.2 C) (Oral)  Resp 24  SpO2 95% Physical Exam  Constitutional: She is oriented to person, place, and time. She appears well-developed and well-nourished.  HENT:  Head: Normocephalic and atraumatic.  Right Ear: External ear normal.  Left Ear: External ear normal.  Nose: Nose normal.  Eyes: Right eye exhibits no discharge. Left eye exhibits no discharge.  Cardiovascular: Regular rhythm and normal heart sounds.  Tachycardia present.   Pulmonary/Chest: Effort normal. No accessory muscle usage. Tachypnea noted. No respiratory distress. She has decreased breath sounds in the right lower field and the left lower field. She has wheezes (diffuse, expiratory).  Abdominal: Soft. There is no tenderness.  Neurological: She is alert and oriented to person, place, and time.  Skin: Skin is warm and dry.  Nursing note and vitals reviewed.   ED Course  Procedures (including critical care time) Labs Review Labs Reviewed  CBC WITH DIFFERENTIAL/PLATELET - Abnormal; Notable for the following:    WBC 14.3 (*)    Hemoglobin 15.2 (*)    Platelets 477 (*)    Neutro Abs 12.0 (*)    Monocytes Absolute 1.6 (*)    All other components within normal limits  COMPREHENSIVE METABOLIC PANEL - Abnormal; Notable for the following:    Sodium 134 (*)    Glucose, Bld 113 (*)    Albumin 3.3 (*)    AST 101 (*)    ALT 122 (*)    Alkaline Phosphatase 174 (*)    All other components within normal limits  CULTURE, BLOOD (ROUTINE X 2)  CULTURE, BLOOD (ROUTINE X 2)  I-STAT CG4 LACTIC ACID, ED    Imaging Review Dg Chest 2 View  05/04/2016  CLINICAL DATA:   Cough and fatigue.  History of breast carcinoma EXAM: CHEST  2 VIEW COMPARISON:  Feb 18, 2013 FINDINGS: There is extensive airspace consolidation throughout the lingula as well as portions of the anterior segment of the left upper lobe. There is scarring and postoperative change in the right upper lobe. There is diffuse interstitial thickening with a questionable degree of interstitial pulmonary edema. Heart size and pulmonary vascularity are within normal limits. There is atherosclerotic calcification in the aortic arch. There is degenerative change in the thoracic spine with mild anterior wedging of a lower thoracic vertebral body. There is no evident adenopathy. There is postoperative change in the right breast region. IMPRESSION: Extensive airspace consolidation consistent with pneumonia in the lingula and in a portion of the anterior segment of the left upper lobe. There is diffuse interstitial prominence which is more pronounced compared to the prior study from 2014 and may represent a degree of underlying interstitial edema. Heart size is normal, however. This appearance may be indicative of noncardiogenic edema due to a potential variety of etiologies or even potential lymphangitic spread of tumor. Postoperative change in the right breast and right upper lobe regions noted. No adenopathy evident. Followup PA and lateral chest radiographs recommended in 3-4 weeks following trial of antibiotic therapy to ensure resolution and exclude underlying malignancy. Electronically Signed   By: Lowella Grip III M.D.   On: 05/04/2016 12:50   I have personally reviewed and evaluated these images and lab results as part of my medical decision-making.   EKG Interpretation   Date/Time:  Friday May 04 2016 16:06:22 EDT Ventricular Rate:  118 PR Interval:    QRS Duration: 93 QT Interval:  299 QTC Calculation: 419 R Axis:   57 Text Interpretation:  Sinus tachycardia Multiform ventricular  premature  complexes  Probable left atrial enlargement Consider anterior infarct  Confirmed by Demmi Sindt MD, Pearlean Sabina [phone]) on 05/04/2016 4:33:00 PM      MDM   Final diagnoses:  Community acquired pneumonia    Patient has community acquired pneumonia with SIRS. Lactate is normal. However she is to In the mid 20s and tachycardic. Given DuoNeb given some wheezing with minimal improvement. I feel given this combined with her age and COPD she will need to be admitted for further respiratory support and IV antibiotics. Admit to the hospitalist.    Sherwood Gambler, MD 05/04/16 [place], MD 05/04/16 [phone]

## 2016-05-04 NOTE — ED Notes (Signed)
Admitting at bedside 

## 2016-05-04 NOTE — Progress Notes (Signed)
NURSING PROGRESS NOTE  [name] [id number] Admission Data: 05/04/2016 11:59 PM Attending Provider: Lily Kocher, MD [id number] Becky [place], MD Code Status: FULL  Allergies:  Sulfonamide derivatives Past Medical History:   has a past medical history of COPD (chronic obstructive pulmonary disease) ([place]); GERD (gastroesophageal reflux disease); Hypertension; Diverticulitis; Osteoporosis; Hyperlipidemia; Emphysema of lung ([place]); Cancer ([place]) (2010); Fibromyalgia (1995); colonic polyps; Peripheral vascular disease [facility]) (May '12 - CT angio); Hypothyroidism; Hearing loss of both ears; Varicose veins of leg with swelling; Complication of anesthesia; and Wears glasses. Past Surgical History:   has past surgical history that includes Appendectomy; Wedge resection (1993); Colonoscopy w/ polypectomy; Abdominal hysterectomy (1973); Colectomy (2002); Cardiac catheterization ('11); Breast surgery (2010); Breast surgery (2010); Breast surgery (2010); Lung surgery (9/12); and Partial mastectomy with needle localization (Left, 02/23/2013). Social History:   reports that she has been smoking Cigarettes.  She has been smoking about 0.75 packs per day. She has never used smokeless tobacco. She reports that she drinks about 6.0 oz of alcohol per week. She reports that she does not use illicit drugs.  [name] is a 70 y.o. female patient admitted from ED:   Last Documented Vital Signs: Blood pressure 118/88, pulse 104, temperature 97.8 F (36.6 C), temperature source Oral, resp. rate 20, weight 68.901 kg (151 lb 14.4 oz), SpO2 93 %.  IV Fluids:  IV in place, occlusive dsg intact without redness, IV cath wrist left, condition patent and no redness None; IV cath forearm left, condition patent and no redness  Skin: Appropriate for ethnicity and intact.  Patient orientated to room. Information packet given to patient. Admission inpatient armband information verified with patient to include name and date of  birth and placed on patient arm. Side rails up x 2, fall assessment and education completed with patient. Patient able to verbalize understanding of risk associated with falls and verbalized understanding to call for assistance before getting out of bed. Call light within reach. Patient able to voice and demonstrate understanding of unit orientation instructions.    Will continue to evaluate and treat per MD orders.  Clydell Hakim RN, BSN

## 2016-05-04 NOTE — ED Notes (Signed)
RN vikki from [place] notified that room upstairs not yet clean.

## 2016-05-04 NOTE — H&P (Signed)
History and Physical    [name] HER:[id number] DOB: 11-22-1945 DOA: 05/04/2016  PCP: Hoyt Koch, MD   Patient coming from: Home  Chief Complaint: Fever, cough  HPI: [name] is a 70 y.o. woman with a history of COPD (she is not on home oxygen), active tobacco use, HTN, GERD, and breast cancer who presents for evaluation of one week of fever, cough, increased wheezing, and shortness of breath.  She has had pain in her left chest with cough and deep inspiration.  She has treated herself with OTC mucinex and increased use of her albuterol rescue inhaler.  She did not present to medical attention until today.  She denies recent antibiotics.  ED Course: She has mild fever.  Tachycardia thought to driven by pain.  Normal lactic acid.  Chest xray shows extensive airspace consolidation consistent with pneumonia involving the lingula and anterior segment of the LUL.  There is also interstitial "prominence" concerning for edema or even lymphangitic spread of tumor.  Heart size noted to be normal.  Patient improved with IV fluid, solumedrol '125mg'$  IV x 1, IV rocephin and azithromycin, and nebulizer treatment.  Hospitalist asked to admit.  Review of Systems: As per HPI otherwise 10 point review of systems negative.    Past Medical History  Diagnosis Date  . COPD (chronic obstructive pulmonary disease) ([place])   . GERD (gastroesophageal reflux disease)   . Hypertension   . Diverticulitis     required colcectomy  . Osteoporosis   . Hyperlipidemia   . Emphysema of lung ([place])   . Cancer ([place]) 2010    invasive ductal breast cancer; on tamoxifen  . Fibromyalgia 1995  . Hx of colonic polyps     Dr. Cristina Gong -last study '11  . Peripheral vascular disease [facility]) May '12 - CT angio    SMA chronic occlusion; stenosis of celiac trunk.  . Hypothyroidism   . Hearing loss of both ears     uses hearing aids  bilaterally  . Varicose veins of leg with swelling     varicose vein surgery - Dr.  Aleda Grana  . Complication of anesthesia     was in ICU after lung surgery 2012  . Wears glasses     Past Surgical History  Procedure Laterality Date  . Appendectomy    . Wedge resection  1993    right upper lobe of lung as part of VAT-nodule/benign  . Colonoscopy w/ polypectomy    . Abdominal hysterectomy  1973    metorrhagia  . Colectomy  2002    sigmoid  . Cardiac catheterization  '11    no obstructive coronary disease  . Breast surgery  2010    mastectomy with reconstructive surgery  . Breast surgery  2010    rt lump x2  . Breast surgery  2010    partial rt br reduction  . Lung surgery  9/12    rt mid lung wedge  . Partial mastectomy with needle localization Left 02/23/2013    Procedure:  LEFT PARTIAL MASTECTOMY WITH NEEDLE LOCALIZATION;  Surgeon: Adin Hector, MD;  Location: [place];  Service: General;  Laterality: Left;     reports that she has been smoking Cigarettes.  She has been smoking about 0.75 packs per day. She has never used smokeless tobacco. She reports that she drinks about 6.0 oz of alcohol per week. She reports that she does not use illicit drugs.  She denies history of EtOH withdrawal.  She is married.  She has adult children.   Allergies  Allergen Reactions  . Sulfonamide Derivatives Swelling    Family History  Problem Relation Age of Onset  . Colon cancer Mother     colon  . COPD Mother     brown lung  . Hypertension Mother   . Diabetes Mother   . Emphysema Mother   . Coronary artery disease Father   . Heart attack Father   . Sudden death Father   . Heart disease Father   . Cancer Sister     fallopian tube  . Hypertension Sister   . Coronary artery disease Brother   . Throat cancer Sister   . Melanoma Sister   . Hypertension Sister   . Pancreatic cancer Sister   . Cancer Sister   . Heart attack Brother     early 94's  . Heart failure Sister      Prior to Admission medications   Medication Sig Start Date  End Date Taking? Authorizing Provider  albuterol (PROVENTIL HFA;VENTOLIN HFA) 108 (90 BASE) MCG/ACT inhaler Inhale 2 puffs into the lungs every 6 (six) hours as needed for wheezing. As a RESCUE medication 08/10/14  Yes Hoyt Koch, MD  Albuterol Sulfate (PROAIR RESPICLICK) 081 (90 BASE) MCG/ACT AEPB Inhale 1-2 puffs into the lungs every 6 (six) hours as needed. 03/02/15  Yes Hoyt Koch, MD  amitriptyline (ELAVIL) 50 MG tablet Take 1 tablet (50 mg total) by mouth at bedtime. 08/12/15  Yes Hoyt Koch, MD  amLODipine (NORVASC) 5 MG tablet Take 1 tablet (5 mg total) by mouth daily. 08/16/15  Yes Hoyt Koch, MD  aspirin 81 MG tablet Take 81 mg by mouth daily.    Yes Historical Provider, MD  atorvastatin (LIPITOR) 80 MG tablet TAKE 1 TABLET (80 MG TOTAL) BY MOUTH DAILY. 01/30/16  Yes Larey Dresser, MD  B Complex Vitamins (VITAMIN-B COMPLEX PO) Take 1 tablet by mouth daily.    Yes Historical Provider, MD  bisacodyl (DULCOLAX) 5 MG EC tablet Take 1 tablet (5 mg total) by mouth daily as needed for moderate constipation. 08/10/14  Yes Hoyt Koch, MD  Calcium Carbonate-Vitamin D (CALCIUM + D PO) Take 600 mg by mouth daily.    Yes Historical Provider, MD  cilostazol (PLETAL) 100 MG tablet Take 1 tablet (100 mg total) by mouth 2 (two) times daily. 06/03/15  Yes Larey Dresser, MD  diclofenac (VOLTAREN) 75 MG EC tablet Take 1 tablet (75 mg total) by mouth 2 (two) times daily. 07/13/15  Yes Hoyt Koch, MD  Fluticasone-Salmeterol (ADVAIR DISKUS) 100-50 MCG/DOSE AEPB Inhale 1 puff into the lungs 2 (two) times daily. 08/12/15  Yes Hoyt Koch, MD  levothyroxine (SYNTHROID, LEVOTHROID) 112 MCG tablet TAKE ONE TABLET BY MOUTH ONE TIME DAILY 08/16/15  Yes Hoyt Koch, MD  LORazepam (ATIVAN) 2 MG tablet TAKE 1 TABLET BY MOUTH AT BEDTIME AS NEEDED FOR ANXIETY 03/22/16  Yes Hoyt Koch, MD  metoprolol tartrate (LOPRESSOR) 25 MG tablet Take 1  tablet (25 mg total) by mouth 2 (two) times daily. 08/16/15  Yes Hoyt Koch, MD  omeprazole (PRILOSEC) 40 MG capsule Take 1 capsule (40 mg total) by mouth 2 (two) times daily. 08/16/15  Yes Hoyt Koch, MD  tiotropium (SPIRIVA HANDIHALER) 18 MCG inhalation capsule Place 1 capsule (18 mcg total) into inhaler and inhale daily. 08/12/15  Yes Hoyt Koch, MD  metoCLOPramide (REGLAN) 10 MG tablet TAKE  ONE TABLET BY MOUTH EVERY SIX HOURS AS NEEDED FOR NAUSEA Patient not taking: Reported on 08/12/2015 02/14/15   Chauncey Cruel, MD    Physical Exam: Filed Vitals:   05/04/16 1630 05/04/16 1745 05/04/16 1830 05/04/16 1845  BP: 127/79 111/68 118/72 114/69  Pulse: 115 105 100 99  Temp:      TempSrc:      Resp: '17 17 21 20  '$ SpO2: 94% 94% 93% 92%      Constitutional: NAD, complaining of left sided pleuritic type chest pain Filed Vitals:   05/04/16 1630 05/04/16 1745 05/04/16 1830 05/04/16 1845  BP: 127/79 111/68 118/72 114/69  Pulse: 115 105 100 99  Temp:      TempSrc:      Resp: '17 17 21 20  '$ SpO2: 94% 94% 93% 92%   Eyes: PERRL, lids and conjunctivae normal ENMT: Mucous membranes are dry. Posterior pharynx clear of any exudate or lesions. Normal dentition.  Neck: normal appearance, supple Respiratory: Diminished on the left but no significant wheeze for me.  Normal respiratory effort.  No accessory muscle use.  She is not on oxygen. Cardiovascular: Mildly tachycardic but regular.  No murmurs / rubs / gallops. No extremity edema. 2+ pedal pulses. GI: abdomen is soft and compressible.  No distention.  No tenderness.  Bowel sounds are present. Musculoskeletal:  No joint deformity in upper and lower extremities. Good ROM, no contractures. Normal muscle tone.  Skin: no rashes, warm and dry Neurologic: No focal deficits. Psychiatric: Normal judgment and insight. Alert and oriented x 3. Normal mood.     Labs on Admission: I have personally reviewed following labs and  imaging studies  CBC:  Recent Labs Lab 05/04/16 1355  WBC 14.3*  NEUTROABS 12.0*  HGB 15.2*  HCT 44.9  MCV 92.2  PLT 177*   Basic Metabolic Panel:  Recent Labs Lab 05/04/16 1355  NA 134*  K 4.2  CL 101  CO2 22  GLUCOSE 113*  BUN 12  CREATININE 0.91  CALCIUM 9.6   GFR: CrCl cannot be calculated (Unknown ideal weight.). Liver Function Tests:  Recent Labs Lab 05/04/16 1355  AST 101*  ALT 122*  ALKPHOS 174*  BILITOT 0.5  PROT 8.0  ALBUMIN 3.3*   Sepsis Labs:  Normal lactic acid  Radiological Exams on Admission: Dg Chest 2 View  05/04/2016  CLINICAL DATA:  Cough and fatigue.  History of breast carcinoma EXAM: CHEST  2 VIEW COMPARISON:  Feb 18, 2013 FINDINGS: There is extensive airspace consolidation throughout the lingula as well as portions of the anterior segment of the left upper lobe. There is scarring and postoperative change in the right upper lobe. There is diffuse interstitial thickening with a questionable degree of interstitial pulmonary edema. Heart size and pulmonary vascularity are within normal limits. There is atherosclerotic calcification in the aortic arch. There is degenerative change in the thoracic spine with mild anterior wedging of a lower thoracic vertebral body. There is no evident adenopathy. There is postoperative change in the right breast region. IMPRESSION: Extensive airspace consolidation consistent with pneumonia in the lingula and in a portion of the anterior segment of the left upper lobe. There is diffuse interstitial prominence which is more pronounced compared to the prior study from 2014 and may represent a degree of underlying interstitial edema. Heart size is normal, however. This appearance may be indicative of noncardiogenic edema due to a potential variety of etiologies or even potential lymphangitic spread of tumor. Postoperative change in the right  breast and right upper lobe regions noted. No adenopathy evident. Followup PA and  lateral chest radiographs recommended in 3-4 weeks following trial of antibiotic therapy to ensure resolution and exclude underlying malignancy. Electronically Signed   By: Lowella Grip III M.D.   On: 05/04/2016 12:50    EKG: Independently reviewed. Sinus tachycardia with PVCs  Assessment/Plan Principal Problem:   Community acquired pneumonia Active Problems:   Tobacco abuse   COPD exacerbation (HCC)   Dehydration   Leukocytosis   CAP (community acquired pneumonia)      CAP causing COPD exacerbation --Rocephin and azithromycin --Solumedrol '60mg'$  IV q6h --Advair substitute --Scheduled nebulizer treatments --Pulmonary toilet --Sputum culture --Urine legionella and streptococcal antigens  Abnormal chest xray as noted above with history of breast cancer --Will go ahead and get CTA chest now (on-call NP notified to follow-up results)  Pleuritic chest pain secondary to pneumonia --Trial of Toradol  HTN --Continue home medications  Hypothyroidism --continue home dose of synthroid  DVT prophylaxis: Lovenox Code Status: FULL Family Communication: Patient alone at time of admission Disposition Plan: Expect she will go home when ready Consults called: NONE Admission status: Observation, med surg   TIME SPENT: 20 minutes   Eber Jones MD [facility] Pager [phone]  If 7PM-7AM, please contact night-coverage [email or web address] Password [facility]  05/04/2016, 7:23 PM

## 2016-05-04 NOTE — ED Notes (Signed)
Fever cough chills and chest pain x 1 week , seen at [facility] today and sent here for further tests, her xray states pneu

## 2016-05-04 NOTE — ED Notes (Signed)
Pt has   Symptoms  Of  Cough   /  Congested         Body  Aches      - tightness  In  Chest  With  A  Cough   Which  Is  Productive  At  Times           pt  Is  A  Smoker   And  Has  A  History  Of  Copd  In  Past         pain  Has  Pain  In her l  Upper chest

## 2016-05-04 NOTE — ED Notes (Signed)
EDP at bedside  

## 2016-05-04 NOTE — ED Provider Notes (Addendum)
CSN: [id number]     Arrival date & time 05/04/16  1107 History   First MD Initiated Contact with Patient 05/04/16 1154     Chief Complaint  Patient presents with  . Cough   (Consider location/radiation/quality/duration/timing/severity/associated sxs/prior Treatment) Patient is a 70 y.o. female presenting with cough. The history is provided by the patient.  Cough Cough characteristics:  Productive and harsh Sputum characteristics:  Yellow Severity:  Moderate Onset quality:  Gradual Duration:  1 week Progression:  Worsening Chronicity:  New Smoker: yes   Context: upper respiratory infection   Relieved by:  None tried Worsened by:  Nothing tried Ineffective treatments:  None tried Associated symptoms: chills, fever and shortness of breath   Associated symptoms: no rhinorrhea     Past Medical History  Diagnosis Date  . COPD (chronic obstructive pulmonary disease) ([place])   . GERD (gastroesophageal reflux disease)   . Hypertension   . Diverticulitis     required colcectomy  . Osteoporosis   . Hyperlipidemia   . Emphysema of lung ([place])   . Cancer ([place]) 2010    invasive ductal breast cancer; on tamoxifen  . Fibromyalgia 1995  . Hx of colonic polyps     Dr. Cristina Gong -last study '11  . Peripheral vascular disease [facility]) May '12 - CT angio    SMA chronic occlusion; stenosis of celiac trunk.  . Hypothyroidism   . Hearing loss of both ears     uses hearing aids  bilaterally  . Varicose veins of leg with swelling     varicose vein surgery - Dr. Aleda Grana  . Complication of anesthesia     was in ICU after lung surgery 2012  . Wears glasses    Past Surgical History  Procedure Laterality Date  . Appendectomy    . Wedge resection  1993    right upper lobe of lung as part of VAT-nodule/benign  . Colonoscopy w/ polypectomy    . Abdominal hysterectomy  1973    metorrhagia  . Colectomy  2002    sigmoid  . Cardiac catheterization  '11    no obstructive coronary disease  .  Breast surgery  2010    mastectomy with reconstructive surgery  . Breast surgery  2010    rt lump x2  . Breast surgery  2010    partial rt br reduction  . Lung surgery  9/12    rt mid lung wedge  . Partial mastectomy with needle localization Left 02/23/2013    Procedure:  LEFT PARTIAL MASTECTOMY WITH NEEDLE LOCALIZATION;  Surgeon: Adin Hector, MD;  Location: [place];  Service: General;  Laterality: Left;   Family History  Problem Relation Age of Onset  . Colon cancer Mother     colon  . COPD Mother     brown lung  . Hypertension Mother   . Diabetes Mother   . Emphysema Mother   . Coronary artery disease Father   . Heart attack Father   . Sudden death Father   . Heart disease Father   . Cancer Sister     fallopian tube  . Hypertension Sister   . Coronary artery disease Brother   . Throat cancer Sister   . Melanoma Sister   . Hypertension Sister   . Pancreatic cancer Sister   . Cancer Sister   . Heart attack Brother     early 38's   Social History  Substance Use Topics  . Smoking status: Former Smoker --  0.75 packs/day    Types: Cigarettes    Quit date: 03/18/2012  . Smokeless tobacco: Never Used     Comment: smokes less   . Alcohol Use: 6.0 oz/week    10 Cans of beer per week     Comment: occ   OB History    No data available     Review of Systems  Constitutional: Positive for fever and chills.  HENT: Negative.  Negative for rhinorrhea.   Respiratory: Positive for cough and shortness of breath.   Cardiovascular: Negative.   All other systems reviewed and are negative.   Allergies  Sulfonamide derivatives  Home Medications   Prior to Admission medications   Medication Sig Start Date End Date Taking? Authorizing Provider  albuterol (PROVENTIL HFA;VENTOLIN HFA) 108 (90 BASE) MCG/ACT inhaler Inhale 2 puffs into the lungs every 6 (six) hours as needed for wheezing. As a RESCUE medication 08/10/14   Hoyt Koch, MD  Albuterol  Sulfate (PROAIR RESPICLICK) 841 (90 BASE) MCG/ACT AEPB Inhale 1-2 puffs into the lungs every 6 (six) hours as needed. 03/02/15   Hoyt Koch, MD  amitriptyline (ELAVIL) 50 MG tablet Take 1 tablet (50 mg total) by mouth at bedtime. 08/12/15   Hoyt Koch, MD  amLODipine (NORVASC) 5 MG tablet Take 1 tablet (5 mg total) by mouth daily. 08/16/15   Hoyt Koch, MD  aspirin 81 MG tablet Take 81 mg by mouth daily.     Historical Provider, MD  atorvastatin (LIPITOR) 80 MG tablet TAKE 1 TABLET (80 MG TOTAL) BY MOUTH DAILY. 01/30/16   Larey Dresser, MD  B Complex Vitamins (VITAMIN-B COMPLEX PO) Take 1 tablet by mouth daily.     Historical Provider, MD  bisacodyl (DULCOLAX) 5 MG EC tablet Take 1 tablet (5 mg total) by mouth daily as needed for moderate constipation. 08/10/14   Hoyt Koch, MD  Calcium Carbonate-Vitamin D (CALCIUM + D PO) Take 600 mg by mouth daily.     Historical Provider, MD  cilostazol (PLETAL) 100 MG tablet Take 1 tablet (100 mg total) by mouth 2 (two) times daily. 06/03/15   Larey Dresser, MD  diclofenac (VOLTAREN) 75 MG EC tablet Take 1 tablet (75 mg total) by mouth 2 (two) times daily. 07/13/15   Hoyt Koch, MD  Fluticasone-Salmeterol (ADVAIR DISKUS) 100-50 MCG/DOSE AEPB Inhale 1 puff into the lungs 2 (two) times daily. 08/12/15   Hoyt Koch, MD  levothyroxine (SYNTHROID, LEVOTHROID) 112 MCG tablet TAKE ONE TABLET BY MOUTH ONE TIME DAILY 08/16/15   Hoyt Koch, MD  LORazepam (ATIVAN) 2 MG tablet TAKE 1 TABLET BY MOUTH AT BEDTIME AS NEEDED FOR ANXIETY 03/22/16   Hoyt Koch, MD  metoCLOPramide (REGLAN) 10 MG tablet TAKE ONE TABLET BY MOUTH EVERY SIX HOURS AS NEEDED FOR NAUSEA Patient not taking: Reported on 08/12/2015 02/14/15   Chauncey Cruel, MD  metoprolol tartrate (LOPRESSOR) 25 MG tablet Take 1 tablet (25 mg total) by mouth 2 (two) times daily. 08/16/15   Hoyt Koch, MD  omeprazole (PRILOSEC) 40 MG capsule  Take 1 capsule (40 mg total) by mouth 2 (two) times daily. 08/16/15   Hoyt Koch, MD  tiotropium (SPIRIVA HANDIHALER) 18 MCG inhalation capsule Place 1 capsule (18 mcg total) into inhaler and inhale daily. 08/12/15   Hoyt Koch, MD   Meds Ordered and Administered this Visit  Medications - No data to display  BP 147/89 mmHg  Pulse 115  Temp(Src) 100.6 F (38.1 C) (Oral)  Resp 18  SpO2 93% No data found.   Physical Exam  Constitutional: She is oriented to person, place, and time. She appears well-developed and well-nourished.  HENT:  Right Ear: External ear normal.  Left Ear: External ear normal.  Mouth/Throat: Oropharynx is clear and moist.  Neck: Normal range of motion. Neck supple.  Cardiovascular: Normal rate, regular rhythm, normal heart sounds and intact distal pulses.   Pulmonary/Chest: Effort normal. No respiratory distress. She has decreased breath sounds. She has wheezes.  Neurological: She is alert and oriented to person, place, and time.  Skin: Skin is warm and dry.  Nursing note and vitals reviewed.   ED Course  Procedures (including critical care time)  Labs Review Labs Reviewed - No data to display  Imaging Review Dg Chest 2 View  05/04/2016  CLINICAL DATA:  Cough and fatigue.  History of breast carcinoma EXAM: CHEST  2 VIEW COMPARISON:  Feb 18, 2013 FINDINGS: There is extensive airspace consolidation throughout the lingula as well as portions of the anterior segment of the left upper lobe. There is scarring and postoperative change in the right upper lobe. There is diffuse interstitial thickening with a questionable degree of interstitial pulmonary edema. Heart size and pulmonary vascularity are within normal limits. There is atherosclerotic calcification in the aortic arch. There is degenerative change in the thoracic spine with mild anterior wedging of a lower thoracic vertebral body. There is no evident adenopathy. There is postoperative change  in the right breast region. IMPRESSION: Extensive airspace consolidation consistent with pneumonia in the lingula and in a portion of the anterior segment of the left upper lobe. There is diffuse interstitial prominence which is more pronounced compared to the prior study from 2014 and may represent a degree of underlying interstitial edema. Heart size is normal, however. This appearance may be indicative of noncardiogenic edema due to a potential variety of etiologies or even potential lymphangitic spread of tumor. Postoperative change in the right breast and right upper lobe regions noted. No adenopathy evident. Followup PA and lateral chest radiographs recommended in 3-4 weeks following trial of antibiotic therapy to ensure resolution and exclude underlying malignancy. Electronically Signed   By: Lowella Grip III M.D.   On: 05/04/2016 12:50   X-rays reviewed and report per radiologist.   Visual Acuity Review  Right Eye Distance:   Left Eye Distance:   Bilateral Distance:    Right Eye Near:   Left Eye Near:    Bilateral Near:         MDM   1. CAP (community acquired pneumonia)    Sent for further med eval, copd , smoker with cap.     Billy Fischer, MD 05/04/16 [place], MD 05/04/16 [phone]

## 2016-05-04 NOTE — ED Notes (Signed)
Followed up with charge RN [place] about room being dirty. RN sts no one has come to clean room yet. Will check back.

## 2016-05-04 NOTE — ED Notes (Signed)
Followed up with [facility] about room still being dirty. Was informed that room is still being cleaned and they would call back when it was ready.

## 2016-05-05 DIAGNOSIS — Z720 Tobacco use: Secondary | ICD-10-CM | POA: Diagnosis not present

## 2016-05-05 DIAGNOSIS — J189 Pneumonia, unspecified organism: Secondary | ICD-10-CM | POA: Diagnosis not present

## 2016-05-05 DIAGNOSIS — J441 Chronic obstructive pulmonary disease with (acute) exacerbation: Secondary | ICD-10-CM | POA: Diagnosis not present

## 2016-05-05 DIAGNOSIS — D72829 Elevated white blood cell count, unspecified: Secondary | ICD-10-CM | POA: Diagnosis not present

## 2016-05-05 DIAGNOSIS — E860 Dehydration: Secondary | ICD-10-CM

## 2016-05-05 LAB — CBC
HCT: 37.5 % (ref 36.0–46.0)
Hemoglobin: 12.6 g/dL (ref 12.0–15.0)
MCH: 30.8 pg (ref 26.0–34.0)
MCHC: 33.6 g/dL (ref 30.0–36.0)
MCV: 91.7 fL (ref 78.0–100.0)
Platelets: 474 10*3/uL — ABNORMAL HIGH (ref 150–400)
RBC: 4.09 MIL/uL (ref 3.87–5.11)
RDW: 14.1 % (ref 11.5–15.5)
WBC: 12.8 10*3/uL — ABNORMAL HIGH (ref 4.0–10.5)

## 2016-05-05 LAB — BASIC METABOLIC PANEL
Anion gap: 11 (ref 5–15)
BUN: 12 mg/dL (ref 6–20)
CO2: 21 mmol/L — ABNORMAL LOW (ref 22–32)
Calcium: 9 mg/dL (ref 8.9–10.3)
Chloride: 107 mmol/L (ref 101–111)
Creatinine, Ser: 0.8 mg/dL (ref 0.44–1.00)
GFR calc Af Amer: >60 mL/min (ref >60)
GFR calc non Af Amer: >60 mL/min (ref >60)
Glucose, Bld: 159 mg/dL — ABNORMAL HIGH (ref 65–99)
Potassium: 3.6 mmol/L (ref 3.5–5.1)
Sodium: 139 mmol/L (ref 135–145)

## 2016-05-05 MED ORDER — IPRATROPIUM-ALBUTEROL 0.5-2.5 (3) MG/3ML IN SOLN
3.0000 mL | Freq: Three times a day (TID) | RESPIRATORY_TRACT | Status: DC
  Administered 2016-05-05 – 2016-05-07 (×7): 3 mL via RESPIRATORY_TRACT
  Filled 2016-05-05 (×7): qty 3

## 2016-05-05 NOTE — Progress Notes (Signed)
RT placed pt on [place] 2 L due to desat at 85% on room air. Sat recovered to 93%.

## 2016-05-05 NOTE — Progress Notes (Signed)
Progress Note    [name]  MCN:[id number] DOB: Apr 20, 1946  DOA: 05/04/2016 PCP: Hoyt Koch, MD    Brief Narrative:   [name] is an 70 y.o. female with a history of COPD (she is not on home oxygen), active tobacco use, HTN, GERD, and breast cancer who presents for evaluation of one week of fever, cough, increased wheezing, and shortness of breath. He was admitted for CAP.   Assessment/Plan:   Principal Problem:   Community acquired pneumonia Active Problems:   Tobacco abuse   COPD exacerbation ([place])   Dehydration   Leukocytosis   CAP (community acquired pneumonia)  CAP with acute COPD exacerbation: - admitted to [facility] for IV antibiotics.  - IV levaquin and IV solumedrol and duonebs.  Urine legionella and streptococcal antigens pending.  Sputum cultures ordered.  Blood cultures ordered and pending.  [place] oxygen to keep sats greater than 90%  Dehydration:  Improved with hydration.    Hypertension: Well controlled resume home meds.   Elevated Liver function tests: She denies any abdominal pain, nausea and vomiting.  US abdomen ordered.      Family Communication/Anticipated D/C date and plan/Code Status   DVT prophylaxis: Lovenox ordered. Code Status: Full Code Family Communication: none atbedside Disposition Plan: pending further investigation.    Medical Consultants:    None.   Procedures:   [place] ABD Anti-Infectives:   Anti-infectives    Start     Dose/Rate Route Frequency Ordered Stop   05/05/16 1600  cefTRIAXone (ROCEPHIN) 1 g in dextrose 5 % 50 mL IVPB     1 g 100 mL/hr over 30 Minutes Intravenous Every 24 hours 05/04/16 2010 05/12/16 1559   05/05/16 1600  azithromycin (ZITHROMAX) 500 mg in dextrose 5 % 250 mL IVPB     500 mg 250 mL/hr over 60 Minutes Intravenous Every 24 hours 05/04/16 2010 05/12/16 1559   05/04/16 1515  cefTRIAXone (ROCEPHIN) 1 g in dextrose 5 % 50 mL IVPB     1 g 100 mL/hr over 30 Minutes Intravenous   Once 05/04/16 1511 05/04/16 1632   05/04/16 1515  azithromycin (ZITHROMAX) 500 mg in dextrose 5 % 250 mL IVPB     500 mg 250 mL/hr over 60 Minutes Intravenous  Once 05/04/16 1511 05/04/16 1703      Subjective:    [name] reports feeling better , but very tired.   Objective:    Filed Vitals:   05/05/16 0732 05/05/16 1500 05/05/16 1508 05/05/16 1528  BP:    100/62  Pulse: 102 96 93 99  Temp:      TempSrc:      Resp: '20 18 18 18  '$ Height:      Weight:      SpO2: 93% 89% 92% 97%    Intake/Output Summary (Last 24 hours) at 05/05/16 1704 Last data filed at 05/05/16 1656  Gross per 24 hour  Intake 1661.67 ml  Output   3675 ml  Net -2013.33 ml   Filed Weights   05/04/16 2125 05/05/16 0619  Weight: 68.901 kg (151 lb 14.4 oz) 66.86 kg (147 lb 6.4 oz)    Exam: General exam: Appears calm and comfortable. On 2 lit [place] Respiratory system: scattered wheezing, and rhonchi heard.  Cardiovascular system: S1 & S2 heard, RRR. No JVD,  rubs, gallops or clicks. No murmurs. Gastrointestinal system: Abdomen is nondistended, soft and nontender. No organomegaly or masses felt. Normal bowel sounds heard. Central nervous system:  Alert and oriented. No focal neurological deficits. Extremities: No clubbing, edema, or cyanosis. Skin: No rashes, lesions or ulcers   Data Reviewed:   I have personally reviewed following labs and imaging studies:  Labs: Basic Metabolic Panel:  Recent Labs Lab 05/04/16 1355 05/05/16 0436  NA 134* 139  K 4.2 3.6  CL 101 107  CO2 22 21*  GLUCOSE 113* 159*  BUN 12 12  CREATININE 0.91 0.80  CALCIUM 9.6 9.0   GFR Estimated Creatinine Clearance: 62.1 mL/min (by C-G formula based on Cr of 0.8). Liver Function Tests:  Recent Labs Lab 05/04/16 1355  AST 101*  ALT 122*  ALKPHOS 174*  BILITOT 0.5  PROT 8.0  ALBUMIN 3.3*   No results for input(s): LIPASE, AMYLASE in the last 168 hours. No results for input(s): AMMONIA in the last 168  hours. Coagulation profile No results for input(s): INR, PROTIME in the last 168 hours.  CBC:  Recent Labs Lab 05/04/16 1355 05/05/16 0436  WBC 14.3* 12.8*  NEUTROABS 12.0*  --   HGB 15.2* 12.6  HCT 44.9 37.5  MCV 92.2 91.7  PLT 477* 474*   Cardiac Enzymes: No results for input(s): CKTOTAL, CKMB, CKMBINDEX, TROPONINI in the last 168 hours. BNP (last 3 results) No results for input(s): PROBNP in the last 8760 hours. CBG: No results for input(s): GLUCAP in the last 168 hours. D-Dimer: No results for input(s): DDIMER in the last 72 hours. Hgb A1c: No results for input(s): HGBA1C in the last 72 hours. Lipid Profile: No results for input(s): CHOL, HDL, LDLCALC, TRIG, CHOLHDL, LDLDIRECT in the last 72 hours. Thyroid function studies: No results for input(s): TSH, T4TOTAL, T3FREE, THYROIDAB in the last 72 hours.  Invalid input(s): FREET3 Anemia work up: No results for input(s): VITAMINB12, FOLATE, FERRITIN, TIBC, IRON, RETICCTPCT in the last 72 hours. Sepsis Labs:  Recent Labs Lab 05/04/16 1355 05/04/16 1412 05/05/16 0436  WBC 14.3*  --  12.8*  LATICACIDVEN  --  1.82  --    Urine analysis:    Component Value Date/Time   COLORURINE YELLOW 03/23/2013 0248   APPEARANCEUR CLEAR 03/23/2013 0248   LABSPEC 1.019 03/23/2013 0248   LABSPEC 1.030 03/08/2009 1016   PHURINE 6.0 03/23/2013 0248   PHURINE 5.0 03/08/2009 1016   GLUCOSEU NEGATIVE 03/23/2013 0248   GLUCOSEU NEGATIVE 09/07/2009 0752   HGBUR NEGATIVE 03/23/2013 0248   HGBUR negative 11/09/2010 0958   HGBUR Negative 03/08/2009 1016   BILIRUBINUR NEGATIVE 03/23/2013 0248   BILIRUBINUR Negative 03/08/2009 [place] 03/23/2013 0248   KETONESUR Negative 03/08/2009 1016   PROTEINUR NEGATIVE 03/23/2013 0248   PROTEINUR Negative 03/08/2009 1016   UROBILINOGEN 0.2 03/23/2013 0248   NITRITE NEGATIVE 03/23/2013 0248   NITRITE Negative 03/08/2009 1016   LEUKOCYTESUR NEGATIVE 03/23/2013 0248    LEUKOCYTESUR Negative 03/08/2009 1016   Microbiology No results found for this or any previous visit (from the past 240 hour(s)).  Radiology: Dg Chest 2 View  05/04/2016  CLINICAL DATA:  Cough and fatigue.  History of breast carcinoma EXAM: CHEST  2 VIEW COMPARISON:  Feb 18, 2013 FINDINGS: There is extensive airspace consolidation throughout the lingula as well as portions of the anterior segment of the left upper lobe. There is scarring and postoperative change in the right upper lobe. There is diffuse interstitial thickening with a questionable degree of interstitial pulmonary edema. Heart size and pulmonary vascularity are within normal limits. There is atherosclerotic calcification in the aortic arch. There is degenerative change  in the thoracic spine with mild anterior wedging of a lower thoracic vertebral body. There is no evident adenopathy. There is postoperative change in the right breast region. IMPRESSION: Extensive airspace consolidation consistent with pneumonia in the lingula and in a portion of the anterior segment of the left upper lobe. There is diffuse interstitial prominence which is more pronounced compared to the prior study from 2014 and may represent a degree of underlying interstitial edema. Heart size is normal, however. This appearance may be indicative of noncardiogenic edema due to a potential variety of etiologies or even potential lymphangitic spread of tumor. Postoperative change in the right breast and right upper lobe regions noted. No adenopathy evident. Followup PA and lateral chest radiographs recommended in 3-4 weeks following trial of antibiotic therapy to ensure resolution and exclude underlying malignancy. Electronically Signed   By: Lowella Grip III M.D.   On: 05/04/2016 12:50   Ct Angio Chest Pe W Or Wo Contrast  05/04/2016  CLINICAL DATA:  Abnormal chest x-ray. History of COPD, breast cancer, and lung wedge resection. EXAM: CT ANGIOGRAPHY CHEST WITH CONTRAST  TECHNIQUE: Multidetector CT imaging of the chest was performed using the standard protocol during bolus administration of intravenous contrast. Multiplanar CT image reconstructions and MIPs were obtained to evaluate the vascular anatomy. CONTRAST:  100 mL Isovue 370 COMPARISON:  Chest radiographs earlier today.  Chest CT 06/06/2011. FINDINGS: Pulmonary arterial opacification is adequate without filling defects to suggest emboli. The thoracic aorta is normal in caliber with mild atherosclerosis. Three-vessel coronary artery atherosclerosis is noted. The heart is normal size. Sequelae of prior right mastectomy are again identified with a breast implant remaining in place. No enlarged axillary or mediastinal lymph nodes are identified. The there are small left hilar lymph nodes. There is mild fluid distention of the distal esophagus with evidence of a small sliding hiatal hernia. There is no pleural effusion. Right upper lobe wedge resection is noted. There is moderate centrilobular emphysema. Extensive consolidation is present in the lingula as well as in the posterior left upper lobe more superiorly. No lung mass is identified. Minimal dependent atelectasis is present in the lower lobes and obscures the tiny right lower lobe nodule seen on the prior CT. There is mild bronchial wall thickening bilaterally. No acute abnormality is identified in the visualized upper portion of the abdomen. No suspicious lytic or blastic osseous lesion is identified. Review of the MIP images confirms the above findings. IMPRESSION: 1. No evidence of pulmonary emboli. 2. Extensive lingular/left upper lobe consolidation consistent with pneumonia. Electronically Signed   By: Logan Bores M.D.   On: 05/04/2016 22:31    Medications:   . amitriptyline  50 mg Oral QHS  . amLODipine  5 mg Oral Daily  . aspirin EC  81 mg Oral Daily  . atorvastatin  80 mg Oral q1800  . azithromycin  500 mg Intravenous Q24H  . cefTRIAXone (ROCEPHIN)  IV   1 g Intravenous Q24H  . cilostazol  100 mg Oral BID  . enoxaparin (LOVENOX) injection  40 mg Subcutaneous Q24H  . guaiFENesin  600 mg Oral BID  . ipratropium-albuterol  3 mL Nebulization TID  . levothyroxine  112 mcg Oral QAC breakfast  . methylPREDNISolone (SOLU-MEDROL) injection  60 mg Intravenous Q6H  . metoprolol tartrate  25 mg Oral BID  . mometasone-formoterol  2 puff Inhalation BID  . pantoprazole  40 mg Oral BID   Continuous Infusions:   Time spent: 25 minutes.  ,  [facility] [phone]  *Please refer to [email or web address], password [email or web address] to get updated schedule on who will round on this patient, as hospitalists switch teams weekly. If 7PM-7AM, please contact night-coverage at [email or web address], password [email or web address] for any overnight needs.  05/05/2016, 5:04 PM

## 2016-05-06 ENCOUNTER — Observation Stay (HOSPITAL_COMMUNITY): Payer: Medicare Other

## 2016-05-06 DIAGNOSIS — I10 Essential (primary) hypertension: Secondary | ICD-10-CM | POA: Diagnosis present

## 2016-05-06 DIAGNOSIS — Z79810 Long term (current) use of selective estrogen receptor modulators (SERMs): Secondary | ICD-10-CM | POA: Diagnosis not present

## 2016-05-06 DIAGNOSIS — H9193 Unspecified hearing loss, bilateral: Secondary | ICD-10-CM | POA: Diagnosis present

## 2016-05-06 DIAGNOSIS — M810 Age-related osteoporosis without current pathological fracture: Secondary | ICD-10-CM | POA: Diagnosis present

## 2016-05-06 DIAGNOSIS — J441 Chronic obstructive pulmonary disease with (acute) exacerbation: Secondary | ICD-10-CM | POA: Diagnosis present

## 2016-05-06 DIAGNOSIS — Z7902 Long term (current) use of antithrombotics/antiplatelets: Secondary | ICD-10-CM | POA: Diagnosis not present

## 2016-05-06 DIAGNOSIS — D72829 Elevated white blood cell count, unspecified: Secondary | ICD-10-CM | POA: Diagnosis not present

## 2016-05-06 DIAGNOSIS — Z853 Personal history of malignant neoplasm of breast: Secondary | ICD-10-CM | POA: Diagnosis not present

## 2016-05-06 DIAGNOSIS — E785 Hyperlipidemia, unspecified: Secondary | ICD-10-CM | POA: Diagnosis present

## 2016-05-06 DIAGNOSIS — E039 Hypothyroidism, unspecified: Secondary | ICD-10-CM | POA: Diagnosis present

## 2016-05-06 DIAGNOSIS — J189 Pneumonia, unspecified organism: Secondary | ICD-10-CM | POA: Diagnosis present

## 2016-05-06 DIAGNOSIS — E860 Dehydration: Secondary | ICD-10-CM | POA: Diagnosis present

## 2016-05-06 DIAGNOSIS — Z7982 Long term (current) use of aspirin: Secondary | ICD-10-CM | POA: Diagnosis not present

## 2016-05-06 DIAGNOSIS — F17210 Nicotine dependence, cigarettes, uncomplicated: Secondary | ICD-10-CM | POA: Diagnosis present

## 2016-05-06 DIAGNOSIS — R079 Chest pain, unspecified: Secondary | ICD-10-CM | POA: Diagnosis present

## 2016-05-06 DIAGNOSIS — I739 Peripheral vascular disease, unspecified: Secondary | ICD-10-CM | POA: Diagnosis present

## 2016-05-06 DIAGNOSIS — J440 Chronic obstructive pulmonary disease with acute lower respiratory infection: Secondary | ICD-10-CM | POA: Diagnosis present

## 2016-05-06 DIAGNOSIS — M797 Fibromyalgia: Secondary | ICD-10-CM | POA: Diagnosis present

## 2016-05-06 DIAGNOSIS — I493 Ventricular premature depolarization: Secondary | ICD-10-CM | POA: Diagnosis present

## 2016-05-06 DIAGNOSIS — K219 Gastro-esophageal reflux disease without esophagitis: Secondary | ICD-10-CM | POA: Diagnosis present

## 2016-05-06 MED ORDER — AZITHROMYCIN 500 MG PO TABS
500.0000 mg | ORAL_TABLET | Freq: Every day | ORAL | Status: DC
  Administered 2016-05-06: 500 mg via ORAL
  Filled 2016-05-06: qty 1

## 2016-05-06 MED ORDER — PREDNISONE 50 MG PO TABS
60.0000 mg | ORAL_TABLET | Freq: Every day | ORAL | Status: DC
  Administered 2016-05-07: 60 mg via ORAL
  Filled 2016-05-06: qty 1

## 2016-05-06 MED ORDER — NICOTINE 21 MG/24HR TD PT24
21.0000 mg | MEDICATED_PATCH | Freq: Every day | TRANSDERMAL | Status: DC
  Administered 2016-05-06: 21 mg via TRANSDERMAL
  Filled 2016-05-06 (×2): qty 1

## 2016-05-06 MED ORDER — GUAIFENESIN-DM 100-10 MG/5ML PO SYRP: 5.0000 mL | ORAL_SOLUTION | ORAL | Status: DC | PRN

## 2016-05-06 NOTE — Progress Notes (Signed)
PHARMACIST - PHYSICIAN COMMUNICATION DR. Carter CONCERNING: Antibiotic IV to Oral Route Change Policy  RECOMMENDATION: This patient is receiving azithromycin by the intravenous route.  Based on criteria approved by the Pharmacy and Therapeutics Committee, the antibiotic is being converted to the equivalent oral dose form.   DESCRIPTION: These criteria include:  Patient being treated for a respiratory tract infection, urinary tract infection, cellulitis or clostridium difficile associated diarrhea if on metronidazole  The patient is not neutropenic and does not exhibit a GI malabsorption state  The patient is eating (either orally or via tube) and/or has been taking other orally administered medications for a least 24 hours  The patient is improving clinically and has a Tmax < 100.5  If you have questions about this conversion, please contact the Pharmacy Department  '[]'$   [phone] )  [facility] '[x]'$   [phone] )  [facility]  '[]'$   [phone] )  [place] '[]'$   [phone] )  [place], PharmD PGY1 Pharmacy Resident Pager: [phone] 05/06/2016 1:15 PM

## 2016-05-06 NOTE — Progress Notes (Signed)
Progress Note    [name]  PJK:[id number] DOB: July 18, 1946  DOA: 05/04/2016 PCP: Christy Koch, MD    Brief Narrative:   [name] is an 70 y.o. female with a history of COPD (she is not on home oxygen), active tobacco use, HTN, GERD, and breast cancer who presents for evaluation of one week of fever, cough, increased wheezing, and shortness of breath. He was admitted for CAP.   Assessment/Plan:   Principal Problem:   Community acquired pneumonia Active Problems:   Tobacco abuse   COPD exacerbation ([place])   Dehydration   Leukocytosis   CAP (community acquired pneumonia)  CAP with acute COPD exacerbation: - admitted to [facility] for IV antibiotics.  - IV levaquin and IV solumedrol and duonebs. Wheezing has improved on exam, will start tapering steroids.  Urine legionella pending.  and streptococcal antigen negative. Mar[place] Kitchen  Sputum cultures ordered.  Blood cultures ordered and negative so far. .  [place] oxygen to keep sats greater than 90% Will add robitussin.  Plan for ambulatory oxygen sats.  afebriel and improving leukocytosis.   Dehydration:  Improved with hydration.    Hypertension: Well controlled resume home meds.   Elevated Liver function tests: She denies any abdominal pain, nausea and vomiting.  US abdomen ordered and was unremarkable.  Repeat liver function tests inam.   Hypothyroidism.: Resume synthroid.   Tobacco abuse: Added nicotine.       Family Communication/Anticipated D/C date and plan/Code Status   DVT prophylaxis: Lovenox ordered. Code Status: Full Code Family Communication: none atbedside Disposition Plan: pending further investigation.    Medical Consultants:    None.   Procedures:   [place] ABD Anti-Infectives:   Anti-infectives    Start     Dose/Rate Route Frequency Ordered Stop   05/06/16 1600  azithromycin (ZITHROMAX) tablet 500 mg     500 mg Oral Daily 05/06/16 1307 05/11/16 1559   05/05/16 1600  cefTRIAXone  (ROCEPHIN) 1 g in dextrose 5 % 50 mL IVPB     1 g 100 mL/hr over 30 Minutes Intravenous Every 24 hours 05/04/16 2010 05/12/16 1559   05/05/16 1600  azithromycin (ZITHROMAX) 500 mg in dextrose 5 % 250 mL IVPB  Status:  Discontinued     500 mg 250 mL/hr over 60 Minutes Intravenous Every 24 hours 05/04/16 2010 05/06/16 1307   05/04/16 1515  cefTRIAXone (ROCEPHIN) 1 g in dextrose 5 % 50 mL IVPB     1 g 100 mL/hr over 30 Minutes Intravenous  Once 05/04/16 1511 05/04/16 1632   05/04/16 1515  azithromycin (ZITHROMAX) 500 mg in dextrose 5 % 250 mL IVPB     500 mg 250 mL/hr over 60 Minutes Intravenous  Once 05/04/16 1511 05/04/16 1703      Subjective:    [name] reports feeling better , requesting cough medication.   Objective:    Vitals:   05/06/16 0916 05/06/16 1325 05/06/16 1424 05/06/16 1711  BP:  100/63  (!) 128/47  Pulse:  (!) 103  (!) 105  Resp:    20  Temp:      TempSrc:      SpO2: 92%  90% 95%  Weight:      Height:        Intake/Output Summary (Last 24 hours) at 05/06/16 1755 Last data filed at 05/06/16 0935  Gross per 24 hour  Intake              900 ml  Output             1275 ml  Net             -375 ml   Filed Weights   05/04/16 2125 05/05/16 0619  Weight: 68.9 kg (151 lb 14.4 oz) 66.9 kg (147 lb 6.4 oz)    Exam: General exam: Appears calm and comfortable. On 2 lit [place] Respiratory system: decreased wheezing, and  Scattered rhonchi heard.  Cardiovascular system: S1 & S2 heard, RRR. No JVD,  rubs, gallops or clicks. No murmurs. Gastrointestinal system: Abdomen is nondistended, soft and nontender. No organomegaly or masses felt. Normal bowel sounds heard. Central nervous system: Alert and oriented. No focal neurological deficits. Extremities: No clubbing, edema, or cyanosis. Skin: No rashes, lesions or ulcers   Data Reviewed:   I have personally reviewed following labs and imaging studies:  Labs: Basic Metabolic Panel:  Recent Labs Lab  05/04/16 1355 05/05/16 0436  NA 134* 139  K 4.2 3.6  CL 101 107  CO2 22 21*  GLUCOSE 113* 159*  BUN 12 12  CREATININE 0.91 0.80  CALCIUM 9.6 9.0   GFR Estimated Creatinine Clearance: 62.1 mL/min (by C-G formula based on SCr of 0.8 mg/dL). Liver Function Tests:  Recent Labs Lab 05/04/16 1355  AST 101*  ALT 122*  ALKPHOS 174*  BILITOT 0.5  PROT 8.0  ALBUMIN 3.3*   No results for input(s): LIPASE, AMYLASE in the last 168 hours. No results for input(s): AMMONIA in the last 168 hours. Coagulation profile No results for input(s): INR, PROTIME in the last 168 hours.  CBC:  Recent Labs Lab 05/04/16 1355 05/05/16 0436  WBC 14.3* 12.8*  NEUTROABS 12.0*  --   HGB 15.2* 12.6  HCT 44.9 37.5  MCV 92.2 91.7  PLT 477* 474*   Cardiac Enzymes: No results for input(s): CKTOTAL, CKMB, CKMBINDEX, TROPONINI in the last 168 hours. BNP (last 3 results) No results for input(s): PROBNP in the last 8760 hours. CBG: No results for input(s): GLUCAP in the last 168 hours. D-Dimer: No results for input(s): DDIMER in the last 72 hours. Hgb A1c: No results for input(s): HGBA1C in the last 72 hours. Lipid Profile: No results for input(s): CHOL, HDL, LDLCALC, TRIG, CHOLHDL, LDLDIRECT in the last 72 hours. Thyroid function studies: No results for input(s): TSH, T4TOTAL, T3FREE, THYROIDAB in the last 72 hours.  Invalid input(s): FREET3 Anemia work up: No results for input(s): VITAMINB12, FOLATE, FERRITIN, TIBC, IRON, RETICCTPCT in the last 72 hours. Sepsis Labs:  Recent Labs Lab 05/04/16 1355 05/04/16 1412 05/05/16 0436  WBC 14.3*  --  12.8*  LATICACIDVEN  --  1.82  --    Urine analysis:    Component Value Date/Time   COLORURINE YELLOW 03/23/2013 0248   APPEARANCEUR CLEAR 03/23/2013 0248   LABSPEC 1.019 03/23/2013 0248   LABSPEC 1.030 03/08/2009 1016   PHURINE 6.0 03/23/2013 0248   GLUCOSEU NEGATIVE 03/23/2013 0248   GLUCOSEU NEGATIVE 09/07/2009 0752   HGBUR NEGATIVE  03/23/2013 0248   HGBUR negative 11/09/2010 [place] 03/23/2013 0248   BILIRUBINUR Negative 03/08/2009 1016   KETONESUR NEGATIVE 03/23/2013 0248   PROTEINUR NEGATIVE 03/23/2013 0248   UROBILINOGEN 0.2 03/23/2013 0248   NITRITE NEGATIVE 03/23/2013 0248   LEUKOCYTESUR NEGATIVE 03/23/2013 0248   LEUKOCYTESUR Negative 03/08/2009 1016   Microbiology Recent Results (from the past 240 hour(s))  Culture, blood (routine x 2)     Status: None (Preliminary result)   Collection Time:  05/04/16  3:37 PM  Result Value Ref Range Status   Specimen Description BLOOD LEFT WRIST  Final   Special Requests IN PEDIATRIC BOTTLE 2CC  Final   Culture NO GROWTH 2 DAYS  Final   Report Status PENDING  Incomplete  Culture, blood (routine x 2)     Status: None (Preliminary result)   Collection Time: 05/04/16  3:37 PM  Result Value Ref Range Status   Specimen Description BLOOD LEFT FOREARM  Final   Special Requests BOTTLES DRAWN AEROBIC AND ANAEROBIC 4CC  Final   Culture NO GROWTH 2 DAYS  Final   Report Status PENDING  Incomplete    Radiology: Ct Angio Chest Pe W Or Wo Contrast  Result Date: 05/04/2016 CLINICAL DATA:  Abnormal chest x-ray. History of COPD, breast cancer, and lung wedge resection. EXAM: CT ANGIOGRAPHY CHEST WITH CONTRAST TECHNIQUE: Multidetector CT imaging of the chest was performed using the standard protocol during bolus administration of intravenous contrast. Multiplanar CT image reconstructions and MIPs were obtained to evaluate the vascular anatomy. CONTRAST:  100 mL Isovue 370 COMPARISON:  Chest radiographs earlier today.  Chest CT 06/06/2011. FINDINGS: Pulmonary arterial opacification is adequate without filling defects to suggest emboli. The thoracic aorta is normal in caliber with mild atherosclerosis. Three-vessel coronary artery atherosclerosis is noted. The heart is normal size. Sequelae of prior right mastectomy are again identified with a breast implant remaining in  place. No enlarged axillary or mediastinal lymph nodes are identified. The there are small left hilar lymph nodes. There is mild fluid distention of the distal esophagus with evidence of a small sliding hiatal hernia. There is no pleural effusion. Right upper lobe wedge resection is noted. There is moderate centrilobular emphysema. Extensive consolidation is present in the lingula as well as in the posterior left upper lobe more superiorly. No lung mass is identified. Minimal dependent atelectasis is present in the lower lobes and obscures the tiny right lower lobe nodule seen on the prior CT. There is mild bronchial wall thickening bilaterally. No acute abnormality is identified in the visualized upper portion of the abdomen. No suspicious lytic or blastic osseous lesion is identified. Review of the MIP images confirms the above findings. IMPRESSION: 1. No evidence of pulmonary emboli. 2. Extensive lingular/left upper lobe consolidation consistent with pneumonia. Electronically Signed   By: Logan Bores M.D.   On: 05/04/2016 22:31   US Abdomen Complete  Result Date: 05/06/2016 CLINICAL DATA:  Elevated liver function tests. EXAM: ABDOMEN ULTRASOUND COMPLETE COMPARISON:  None. FINDINGS: Gallbladder: No gallstones seen. There is no gallbladder wall thickening, pericholecystic fluid or other secondary sonographic signs of acute cholecystitis. No sonographic Murphy's sign elicited during the examination, per the sonographer. Common bile duct: Diameter: Normal at 5 mm Liver: No focal lesion identified. Within normal limits in parenchymal echogenicity. IVC: No abnormality visualized. Pancreas: Visualized portion unremarkable. 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 No acute findings. Right Kidney: Length: 11.4 cm. Echogenicity within normal limits. No mass or hydronephrosis visualized. Left  Kidney: Length: 10.9 cm. Echogenicity within normal limits. No mass or hydronephrosis visualized. Abdominal aorta: No aneurysm visualized. Aortic bifurcation obscured by overlying bowel gas. Other findings: None. IMPRESSION: No acute findings. Specifically, liver and gallbladder appear normal. No bile duct dilatation. Electronically Signed   By: Franki Cabot M.D.   On: 05/06/2016 13:55   Medications:   . amitriptyline  50 mg Oral QHS  .  amLODipine  5 mg Oral Daily  . aspirin EC  81 mg Oral Daily  . atorvastatin  80 mg Oral q1800  . azithromycin  500 mg Oral Q1500  . cefTRIAXone (ROCEPHIN)  IV  1 g Intravenous Q24H  . cilostazol  100 mg Oral BID  . enoxaparin (LOVENOX) injection  40 mg Subcutaneous Q24H  . guaiFENesin  600 mg Oral BID  . ipratropium-albuterol  3 mL Nebulization TID  . levothyroxine  112 mcg Oral QAC breakfast  . metoprolol tartrate  25 mg Oral BID  . mometasone-formoterol  2 puff Inhalation BID  . nicotine  21 mg Transdermal Daily  . pantoprazole  40 mg Oral BID  . [START ON 05/07/2016] predniSONE  60 mg Oral QAC breakfast   Continuous Infusions:   Time spent: 25 minutes.    LOS: 0 days   Lacreshia Bondarenko  [facility] [phone]  *Please refer to [email or web address], password [email or web address] to get updated schedule on who will round on this patient, as hospitalists switch teams weekly. If 7PM-7AM, please contact night-coverage at [email or web address], password [email or web address] for any overnight needs.  05/06/2016, 5:55 PM

## 2016-05-06 NOTE — Progress Notes (Signed)
PHARMACY NOTE -  ANTIBIOTIC RENAL DOSE ADJUSTMENT   Request received for Pharmacy to assist with antibiotic renal dose adjustment.  Patient has been initiated on  Azithromycin and Ceftriaxone for COPD exacerbation. SCr 0.8, estimated CrCl 62 ml/min Current dosage is appropriate and need for further dosage adjustment appears unlikely at present. Will sign off at this time.  Please reconsult if a change in clinical status warrants re-evaluation of dosage.  Rober Minion, PharmD., MS Clinical Pharmacist Pager:  [phone] Thank you for allowing pharmacy to be part of this patients care team.

## 2016-05-07 LAB — LEGIONELLA PNEUMOPHILA SEROGP 1 UR AG: L. pneumophila Serogp 1 Ur Ag: NEGATIVE

## 2016-05-07 MED ORDER — ALBUTEROL SULFATE HFA 108 (90 BASE) MCG/ACT IN AERS: 2.0000 | INHALATION_SPRAY | RESPIRATORY_TRACT | 1 refills | Status: DC | PRN

## 2016-05-07 MED ORDER — AMOXICILLIN-POT CLAVULANATE 875-125 MG PO TABS: 1.0000 | ORAL_TABLET | Freq: Two times a day (BID) | ORAL | 0 refills | Status: DC

## 2016-05-07 MED ORDER — NICOTINE 21 MG/24HR TD PT24: 21.0000 mg | MEDICATED_PATCH | Freq: Every day | TRANSDERMAL | 0 refills | Status: DC

## 2016-05-07 MED ORDER — PREDNISONE 20 MG PO TABS: ORAL_TABLET | ORAL | 0 refills | Status: DC

## 2016-05-07 MED ORDER — GUAIFENESIN-DM 100-10 MG/5ML PO SYRP: 5.0000 mL | ORAL_SOLUTION | ORAL | 0 refills | Status: DC | PRN

## 2016-05-07 MED ORDER — IPRATROPIUM-ALBUTEROL 0.5-2.5 (3) MG/3ML IN SOLN: 3.0000 mL | Freq: Four times a day (QID) | RESPIRATORY_TRACT | 2 refills | Status: DC | PRN

## 2016-05-07 MED ORDER — LEVOFLOXACIN 750 MG PO TABS: 750.0000 mg | ORAL_TABLET | Freq: Every day | ORAL | 0 refills | Status: DC

## 2016-05-07 MED ORDER — FLUTICASONE-SALMETEROL 100-50 MCG/DOSE IN AEPB: 1.0000 | INHALATION_SPRAY | Freq: Two times a day (BID) | RESPIRATORY_TRACT | 1 refills | Status: DC

## 2016-05-08 NOTE — Discharge Summary (Signed)
Physician Discharge Summary  [name]:[id number] DOB: [birth year] DOA: 05/04/2016  PCP: Hoyt Koch, MD  Admit date: 05/04/2016 Discharge date: 05/07/2016  Admitted From: (Home, Disposition:  (Home   Recommendations for Outpatient Follow-up:  1. Follow up with PCP in 1-2 weeks 2. Please obtain BMP/CBC in one week 3. Please follow up with PCP with a repeat liver function panel.  4. Advise to stop smoking.     Discharge Condition:stable. CODE STATUS:full code.  Diet recommendation: Heart Healthy   Brief/Interim Summary: [name] is an 70 y.o. female with a history of COPD (she is not on home oxygen), active tobacco use, HTN, GERD, and breast cancer who presents for evaluation of one week of fever, cough, increased wheezing, and shortness of breath. He was admitted for CAP.   Discharge Diagnoses:  Principal Problem:   Community acquired pneumonia Active Problems:   Tobacco abuse   COPD exacerbation ([place])   Dehydration   Leukocytosis   CAP (community acquired pneumonia)  CAP with acute COPD exacerbation: - admitted to [facility] for IV antibiotics.  - IV levaquin and IV solumedrol and duonebs. Wheezing has improved on exam,started tapering steroids. She will be discharged on oral augmentin to complete the course of antibiotics. Discussed with pharmacy and changed levaquin to augmentin.  streptococcal antigen negative. .  Blood cultures ordered and negative so far. .  Oxygen sats at rest and on ambulation were well >90% afebriel and improving leukocytosis.   Dehydration:  Improved with hydration.    Hypertension: Well controlled resume home meds.   Elevated Liver function tests: She denies any abdominal pain, nausea and vomiting.  US abdomen ordered and was unremarkable.  Repeat liver function tests iin 4 weeks at PCP office.   Hypothyroidism.: Resume synthroid.   Tobacco abuse: Added nicotine.    Discharge Instructions  Discharge  Instructions    Call MD for:  difficulty breathing, headache or visual disturbances    Complete by:  As directed   Call MD for:  temperature >100.4    Complete by:  As directed   Diet - low sodium heart healthy    Complete by:  As directed   Discharge instructions    Complete by:  As directed   Please follow up with PCP in one week post hospitalization visit.       Medication List    STOP taking these medications   diclofenac 75 MG EC tablet Commonly known as:  VOLTAREN   metoCLOPramide 10 MG tablet Commonly known as:  REGLAN     TAKE these medications   albuterol 108 (90 Base) MCG/ACT inhaler Commonly known as:  PROVENTIL HFA;VENTOLIN HFA Inhale 2 puffs into the lungs every 4 (four) hours as needed for wheezing. As a RESCUE medication What changed:  when to take this  Another medication with the same name was removed. Continue taking this medication, and follow the directions you see here.   amitriptyline 50 MG tablet Commonly known as:  ELAVIL Take 1 tablet (50 mg total) by mouth at bedtime.   amLODipine 5 MG tablet Commonly known as:  NORVASC Take 1 tablet (5 mg total) by mouth daily.   amoxicillin-clavulanate 875-125 MG tablet Commonly known as:  AUGMENTIN Take 1 tablet by mouth 2 (two) times daily.   aspirin 81 MG tablet Take 81 mg by mouth daily.   atorvastatin 80 MG tablet Commonly known as:  LIPITOR TAKE 1 TABLET (80 MG TOTAL) BY MOUTH DAILY.   bisacodyl 5  MG EC tablet Commonly known as:  DULCOLAX Take 1 tablet (5 mg total) by mouth daily as needed for moderate constipation.   CALCIUM + D PO Take 600 mg by mouth daily.   cilostazol 100 MG tablet Commonly known as:  PLETAL Take 1 tablet (100 mg total) by mouth 2 (two) times daily.   Fluticasone-Salmeterol 100-50 MCG/DOSE Aepb Commonly known as:  ADVAIR DISKUS Inhale 1 puff into the lungs 2 (two) times daily.   guaiFENesin-dextromethorphan 100-10 MG/5ML syrup Commonly known as:  ROBITUSSIN  DM Take 5 mLs by mouth every 4 (four) hours as needed for cough.   ipratropium-albuterol 0.5-2.5 (3) MG/3ML Soln Commonly known as:  DUONEB Take 3 mLs by nebulization every 6 (six) hours as needed.   levothyroxine 112 MCG tablet Commonly known as:  SYNTHROID, LEVOTHROID TAKE ONE TABLET BY MOUTH ONE TIME DAILY   LORazepam 2 MG tablet Commonly known as:  ATIVAN TAKE 1 TABLET BY MOUTH AT BEDTIME AS NEEDED FOR ANXIETY   metoprolol tartrate 25 MG tablet Commonly known as:  LOPRESSOR Take 1 tablet (25 mg total) by mouth 2 (two) times daily.   nicotine 21 mg/24hr patch Commonly known as:  NICODERM CQ - dosed in mg/24 hours Place 1 patch (21 mg total) onto the skin daily.   omeprazole 40 MG capsule Commonly known as:  PRILOSEC Take 1 capsule (40 mg total) by mouth 2 (two) times daily.   predniSONE 20 MG tablet Commonly known as:  DELTASONE Prednisone 60 mg daily for 2 more days followed by  Prednisone 40 mg daily for 3 days followed by  Prednisone 20 mg daily for 3 days   tiotropium 18 MCG inhalation capsule Commonly known as:  SPIRIVA HANDIHALER Place 1 capsule (18 mcg total) into inhaler and inhale daily.   VITAMIN-B COMPLEX PO Take 1 tablet by mouth daily.      Follow-up Information    Hoyt Koch, MD. Schedule an appointment as soon as possible for a visit in 1 week(s).   Specialty:  Internal Medicine Contact information: [place] [address] [phone]          Allergies  Allergen Reactions  . Sulfonamide Derivatives Swelling    Consultations:  none   Procedures/Studies: Dg Chest 2 View  Result Date: 05/04/2016 CLINICAL DATA:  Cough and fatigue.  History of breast carcinoma EXAM: CHEST  2 VIEW COMPARISON:  Feb 18, 2013 FINDINGS: There is extensive airspace consolidation throughout the lingula as well as portions of the anterior segment of the left upper lobe. There is scarring and postoperative change in the right upper lobe.  There is diffuse interstitial thickening with a questionable degree of interstitial pulmonary edema. Heart size and pulmonary vascularity are within normal limits. There is atherosclerotic calcification in the aortic arch. There is degenerative change in the thoracic spine with mild anterior wedging of a lower thoracic vertebral body. There is no evident adenopathy. There is postoperative change in the right breast region. IMPRESSION: Extensive airspace consolidation consistent with pneumonia in the lingula and in a portion of the anterior segment of the left upper lobe. There is diffuse interstitial prominence which is more pronounced compared to the prior study from 2014 and may represent a degree of underlying interstitial edema. Heart size is normal, however. This appearance may be indicative of noncardiogenic edema due to a potential variety of etiologies or even potential lymphangitic spread of tumor. Postoperative change in the right breast and right upper lobe regions noted.  No adenopathy evident. Followup PA and lateral chest radiographs recommended in 3-4 weeks following trial of antibiotic therapy to ensure resolution and exclude underlying malignancy. Electronically Signed   By: Lowella Grip III M.D.   On: 05/04/2016 12:50   Ct Angio Chest Pe W Or Wo Contrast  Result Date: 05/04/2016 CLINICAL DATA:  Abnormal chest x-ray. History of COPD, breast cancer, and lung wedge resection. EXAM: CT ANGIOGRAPHY CHEST WITH CONTRAST TECHNIQUE: Multidetector CT imaging of the chest was performed using the standard protocol during bolus administration of intravenous contrast. Multiplanar CT image reconstructions and MIPs were obtained to evaluate the vascular anatomy. CONTRAST:  100 mL Isovue 370 COMPARISON:  Chest radiographs earlier today.  Chest CT 06/06/2011. FINDINGS: Pulmonary arterial opacification is adequate without filling defects to suggest emboli. The thoracic aorta is normal in caliber with mild  atherosclerosis. Three-vessel coronary artery atherosclerosis is noted. The heart is normal size. Sequelae of prior right mastectomy are again identified with a breast implant remaining in place. No enlarged axillary or mediastinal lymph nodes are identified. The there are small left hilar lymph nodes. There is mild fluid distention of the distal esophagus with evidence of a small sliding hiatal hernia. There is no pleural effusion. Right upper lobe wedge resection is noted. There is moderate centrilobular emphysema. Extensive consolidation is present in the lingula as well as in the posterior left upper lobe more superiorly. No lung mass is identified. Minimal dependent atelectasis is present in the lower lobes and obscures the tiny right lower lobe nodule seen on the prior CT. There is mild bronchial wall thickening bilaterally. No acute abnormality is identified in the visualized upper portion of the abdomen. No suspicious lytic or blastic osseous lesion is identified. Review of the MIP images confirms the above findings. IMPRESSION: 1. No evidence of pulmonary emboli. 2. Extensive lingular/left upper lobe consolidation consistent with pneumonia. Electronically Signed   By: Logan Bores M.D.   On: 05/04/2016 22:31   US Abdomen Complete  Result Date: 05/06/2016 CLINICAL DATA:  Elevated liver function tests. EXAM: ABDOMEN ULTRASOUND COMPLETE COMPARISON:  None. FINDINGS: Gallbladder: No gallstones seen. There is no gallbladder wall thickening, pericholecystic fluid or other secondary sonographic signs of acute cholecystitis. No sonographic Murphy's sign elicited during the examination, per the sonographer. Common bile duct: Diameter: Normal at 5 mm Liver: No focal lesion identified. Within normal limits in parenchymal echogenicity. IVC: No abnormality visualized. Pancreas: Visualized portion unremarkable. 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 No acute findings. Right Kidney: Length: 11.4 cm. Echogenicity within normal limits. No mass or hydronephrosis visualized. Left Kidney: Length: 10.9 cm. Echogenicity within normal limits. No mass or hydronephrosis visualized. Abdominal aorta: No aneurysm visualized. Aortic bifurcation obscured by overlying bowel gas. Other findings: None. IMPRESSION: No acute findings. Specifically, liver and gallbladder appear normal. No bile duct dilatation. Electronically Signed   By: Franki Cabot M.D.   On: 05/06/2016 13:55      Subjective: Reports feeling better , wants to go home. No chest pain or sob.   Discharge Exam: Vitals:   05/07/16 0933 05/07/16 1214  BP: 126/74   Pulse: 94 90  Resp:    Temp:     Vitals:   05/07/16 0457 05/07/16 0756 05/07/16 0933 05/07/16 1214  BP: 132/61  126/74   Pulse: 92  94 90  Resp: 18     Temp: 97.6 F (36.4 C)  TempSrc:      SpO2: 91% 96% 91% (!) 85%  Weight:      Height:        General: Pt is alert, awake, not in acute distress Cardiovascular: RRR, S1/S2 +, no rubs, no gallops Respiratory: CTA bilaterally, no wheezing, no rhonchi Abdominal: Soft, NT, ND, bowel sounds + Extremities: no edema, no cyanosis    The results of significant diagnostics from this hospitalization (including imaging, microbiology, ancillary and laboratory) are listed below for reference.     Microbiology: Recent Results (from the past 240 hour(s))  Culture, blood (routine x 2)     Status: None (Preliminary result)   Collection Time: 05/04/16  3:37 PM  Result Value Ref Range Status   Specimen Description BLOOD LEFT WRIST  Final   Special Requests IN PEDIATRIC BOTTLE 2CC  Final   Culture NO GROWTH 3 DAYS  Final   Report Status PENDING  Incomplete  Culture, blood (routine x 2)     Status: None (Preliminary result)   Collection Time: 05/04/16  3:37 PM  Result Value Ref Range Status   Specimen Description BLOOD  LEFT FOREARM  Final   Special Requests BOTTLES DRAWN AEROBIC AND ANAEROBIC 4CC  Final   Culture NO GROWTH 3 DAYS  Final   Report Status PENDING  Incomplete     Labs: BNP (last 3 results) No results for input(s): BNP in the last 8760 hours. Basic Metabolic Panel:  Recent Labs Lab 05/04/16 1355 05/05/16 0436  NA 134* 139  K 4.2 3.6  CL 101 107  CO2 22 21*  GLUCOSE 113* 159*  BUN 12 12  CREATININE 0.91 0.80  CALCIUM 9.6 9.0   Liver Function Tests:  Recent Labs Lab 05/04/16 1355  AST 101*  ALT 122*  ALKPHOS 174*  BILITOT 0.5  PROT 8.0  ALBUMIN 3.3*   No results for input(s): LIPASE, AMYLASE in the last 168 hours. No results for input(s): AMMONIA in the last 168 hours. CBC:  Recent Labs Lab 05/04/16 1355 05/05/16 0436  WBC 14.3* 12.8*  NEUTROABS 12.0*  --   HGB 15.2* 12.6  HCT 44.9 37.5  MCV 92.2 91.7  PLT 477* 474*   Cardiac Enzymes: No results for input(s): CKTOTAL, CKMB, CKMBINDEX, TROPONINI in the last 168 hours. BNP: Invalid input(s): POCBNP CBG: No results for input(s): GLUCAP in the last 168 hours. D-Dimer No results for input(s): DDIMER in the last 72 hours. Hgb A1c No results for input(s): HGBA1C in the last 72 hours. Lipid Profile No results for input(s): CHOL, HDL, LDLCALC, TRIG, CHOLHDL, LDLDIRECT in the last 72 hours. Thyroid function studies No results for input(s): TSH, T4TOTAL, T3FREE, THYROIDAB in the last 72 hours.  Invalid input(s): FREET3 Anemia work up No results for input(s): VITAMINB12, FOLATE, FERRITIN, TIBC, IRON, RETICCTPCT in the last 72 hours. Urinalysis    Component Value Date/Time   COLORURINE YELLOW 03/23/2013 0248   APPEARANCEUR CLEAR 03/23/2013 0248   LABSPEC 1.019 03/23/2013 0248   LABSPEC 1.030 03/08/2009 1016   PHURINE 6.0 03/23/2013 0248   GLUCOSEU NEGATIVE 03/23/2013 0248   GLUCOSEU NEGATIVE 09/07/2009 0752   HGBUR NEGATIVE 03/23/2013 0248   HGBUR negative 11/09/2010 0958   BILIRUBINUR NEGATIVE  03/23/2013 0248   BILIRUBINUR Negative 03/08/2009 1016   KETONESUR NEGATIVE 03/23/2013 0248   PROTEINUR NEGATIVE 03/23/2013 0248   UROBILINOGEN 0.2 03/23/2013 0248   NITRITE NEGATIVE 03/23/2013 0248   LEUKOCYTESUR NEGATIVE 03/23/2013 0248   LEUKOCYTESUR Negative 03/08/2009 1016   Sepsis Labs Invalid  input(s): PROCALCITONIN,  WBC,  LACTICIDVEN Microbiology Recent Results (from the past 240 hour(s))  Culture, blood (routine x 2)     Status: None (Preliminary result)   Collection Time: 05/04/16  3:37 PM  Result Value Ref Range Status   Specimen Description BLOOD LEFT WRIST  Final   Special Requests IN PEDIATRIC BOTTLE 2CC  Final   Culture NO GROWTH 3 DAYS  Final   Report Status PENDING  Incomplete  Culture, blood (routine x 2)     Status: None (Preliminary result)   Collection Time: 05/04/16  3:37 PM  Result Value Ref Range Status   Specimen Description BLOOD LEFT FOREARM  Final   Special Requests BOTTLES DRAWN AEROBIC AND ANAEROBIC 4CC  Final   Culture NO GROWTH 3 DAYS  Final   Report Status PENDING  Incomplete     Time coordinating discharge: Over 30 minutes  SIGNED:   Hosie Poisson, MD  [facility] 05/08/2016, 9:24 AM Pager   If 7PM-7AM, please contact night-coverage [email or web address] Password [email or web address]

## 2016-05-09 ENCOUNTER — Telehealth: Payer: Self-pay | Admitting: *Deleted

## 2016-05-09 LAB — CULTURE, BLOOD (ROUTINE X 2)
Culture: NO GROWTH
Culture: NO GROWTH

## 2016-05-09 NOTE — Telephone Encounter (Signed)
Transition Care Management Follow-up Telephone Call   Date discharged? 05/07/16   How have you been since you were released from the hospital? Pt states she have been doing alright   Do you understand why you were in the hospital? YES   Do you understand the discharge instructions? YES   Where were you discharged to? Home   Items Reviewed:  Medications reviewed: YES  Allergies reviewed: YES}  Dietary changes reviewed: YES  Referrals reviewed: No referral needed   Functional Questionnaire:   Activities of Daily Living (ADLs):   She states she are independent in the following: ambulation, bathing and hygiene, feeding, continence, grooming, toileting and dressing States she doesn't require assistance    Any transportation issues/concerns?: NO   Any patient concerns? NO   Confirmed importance and date/time of follow-up visits scheduled YES, appt was already made 05/11/16. Inform pt to keep appt  Provider Appointment booked with Dr. Sharlet Salina  Confirmed with patient if condition begins to worsen call PCP or go to the ER.  Patient was given the office number and encouraged to call back with question or concerns.  : YES

## 2016-05-10 LAB — HIV ANTIBODY (ROUTINE TESTING W REFLEX)

## 2016-05-11 ENCOUNTER — Ambulatory Visit (INDEPENDENT_AMBULATORY_CARE_PROVIDER_SITE_OTHER): Payer: Medicare Other | Admitting: Internal Medicine

## 2016-05-11 ENCOUNTER — Encounter: Payer: Self-pay | Admitting: Internal Medicine

## 2016-05-11 VITALS — BP 130/60 | HR 79 | Temp 98.4°F | Resp 18 | Ht 66.0 in | Wt 145.4 lb

## 2016-05-11 DIAGNOSIS — J449 Chronic obstructive pulmonary disease, unspecified: Secondary | ICD-10-CM

## 2016-05-11 DIAGNOSIS — Z5189 Encounter for other specified aftercare: Secondary | ICD-10-CM | POA: Diagnosis not present

## 2016-05-11 DIAGNOSIS — Z720 Tobacco use: Secondary | ICD-10-CM | POA: Diagnosis not present

## 2016-05-11 DIAGNOSIS — J438 Other emphysema: Secondary | ICD-10-CM

## 2016-05-11 DIAGNOSIS — J189 Pneumonia, unspecified organism: Secondary | ICD-10-CM | POA: Diagnosis not present

## 2016-05-11 MED ORDER — BUPROPION HCL ER (SR) 150 MG PO TB12: 150.0000 mg | ORAL_TABLET | Freq: Two times a day (BID) | ORAL | 3 refills | Status: DC

## 2016-05-11 NOTE — Patient Instructions (Signed)
We have sent in the bupropion for the stopping smoking. Take 1 pill daily for the first week, then increase to 1 pill twice daily. You need to commit to stopping smoking within the first 1 month of taking.   It is recommended to take this medicine for 3 months to give your body the best chance to stop smoking.   We have ordered the chest x-ray and want you to come back the week of August 28th or later to get that done (just come to the basement, you do not need an appointment).   It is okay to start using the nebulizer medicine only when needed for breathing instead of every 6 hours.   The advair can be replaced with anoro (which is 1 inhaler, 1 puff daily) when you run out. Just call us to let us know when we need to send it in.

## 2016-05-11 NOTE — Assessment & Plan Note (Signed)
Ordered CXR for 1 month from now. 1 more day of augmentin and 4 more days of prednisone. She is doing better so okay to change duoneb to prn only instead of scheduled due to her symptoms with the medicines.

## 2016-05-11 NOTE — Assessment & Plan Note (Signed)
She is looking to stop smoking which is great. We talked about changing her advair to a different inhaler anoro when she runs out for better control. If she likes that inhaler we can change the spiriva as well to simplify her regimen. Change duonebs to prn only for symptoms of tachycardia and restlessness and insomnia on them. She is using advair and spiriva. 1 more day augmentin and 4 more days of prednisone. Some mild wheezing on exam still today.

## 2016-05-11 NOTE — Progress Notes (Signed)
Pre visit review using our clinic review tool, if applicable. No additional management support is needed unless otherwise documented below in the visit note. 

## 2016-05-11 NOTE — Assessment & Plan Note (Signed)
She is willing to try bupropion to stop smoking as she has done this in the past successfully. Rx written and advised she needs to do 3 months of therapy for maximal success chances. This is a good time for her to quit with recent scare of being in hospital and on oxygen. She does not want to end up on oxygen.

## 2016-05-11 NOTE — Progress Notes (Signed)
   Subjective:    Patient ID: [name], female    DOB: [birth year], 70 y.o.   MRN: [id number]  HPI The patient is a 70 YO female coming in for follow up of hospital (in for CAP and COPD exacerbation, treated with steroids and IV antibiotics and sent home with antibiotics and steroids). Did not get sent home with oxygen. She is using the nebulizer duoneb every 6 hours and it is making her jittery and hyper and not able to sleep well at all. She is breathing better and not having the tightness with breathing. Some cough that is not productive but not often. No fevers or chills. Some fatigue still and not sleeping well. No diarrhea, constipation, chest pains. No headaches or migraines.   PMH, [facility], social history reviewed and updated.   Review of Systems  Constitutional: Negative for activity change, fatigue, fever and unexpected weight change.  HENT: Negative.   Eyes: Negative.   Respiratory: Positive for cough and shortness of breath. Negative for chest tightness and wheezing.        Much improved  Cardiovascular: Negative for chest pain, palpitations and leg swelling.  Gastrointestinal: Negative for abdominal distention, abdominal pain, constipation, diarrhea and vomiting.  Musculoskeletal: Positive for myalgias. Negative for back pain and gait problem.  Skin: Negative.   Neurological: Negative for dizziness, weakness, light-headedness and headaches.  Psychiatric/Behavioral: Positive for sleep disturbance.      Objective:   Physical Exam  Constitutional: She is oriented to person, place, and time. She appears well-developed and well-nourished.  HENT:  Head: Normocephalic and atraumatic.  Eyes: EOM are normal.  Neck: Normal range of motion.  Cardiovascular: Normal rate and regular rhythm.   Pulmonary/Chest: Effort normal. No respiratory distress. She has wheezes. She has no rales. She exhibits no tenderness.  Bilateral wheezing mild, good air flow   Abdominal: Soft. Bowel sounds are  normal. She exhibits no distension. There is no tenderness. There is no rebound.  Musculoskeletal: She exhibits no edema.  Neurological: She is alert and oriented to person, place, and time. Coordination normal.  Skin: Skin is warm and dry.   Vitals:   05/11/16 1403  BP: 130/60  Pulse: 79  Resp: 18  Temp: 98.4 F (36.9 C)  TempSrc: Oral  SpO2: 91%  Weight: 145 lb 6.4 oz (66 kg)  Height: '5\' 6"'$  (1.676 m)      Assessment & Plan:

## 2016-05-15 ENCOUNTER — Encounter: Payer: Self-pay | Admitting: Internal Medicine

## 2016-06-06 ENCOUNTER — Ambulatory Visit (INDEPENDENT_AMBULATORY_CARE_PROVIDER_SITE_OTHER)
Admission: RE | Admit: 2016-06-06 | Discharge: 2016-06-06 | Disposition: A | Discharge status: Home / Self Care | Payer: Medicare Other | Source: Ambulatory Visit | Attending: Internal Medicine | Admitting: Internal Medicine

## 2016-06-06 DIAGNOSIS — J189 Pneumonia, unspecified organism: Secondary | ICD-10-CM | POA: Diagnosis not present

## 2016-06-15 ENCOUNTER — Other Ambulatory Visit: Payer: Self-pay | Admitting: Internal Medicine

## 2016-06-15 NOTE — Telephone Encounter (Signed)
Sent to pharmacy 

## 2016-06-26 ENCOUNTER — Other Ambulatory Visit: Payer: Self-pay | Admitting: Cardiology

## 2016-06-30 ENCOUNTER — Other Ambulatory Visit: Payer: Self-pay | Admitting: Cardiology

## 2016-06-30 DIAGNOSIS — I739 Peripheral vascular disease, unspecified: Secondary | ICD-10-CM

## 2016-07-02 ENCOUNTER — Emergency Department (HOSPITAL_COMMUNITY): Payer: Medicare Other

## 2016-07-02 ENCOUNTER — Telehealth: Payer: Self-pay | Admitting: Internal Medicine

## 2016-07-02 ENCOUNTER — Inpatient Hospital Stay (HOSPITAL_COMMUNITY)
Admission: EM | Admit: 2016-07-02 | Discharge: 2016-07-10 | DRG: 190 | Disposition: A | Discharge status: Home / Self Care | Payer: Medicare Other | Attending: Internal Medicine | Admitting: Internal Medicine

## 2016-07-02 DIAGNOSIS — I2510 Atherosclerotic heart disease of native coronary artery without angina pectoris: Secondary | ICD-10-CM | POA: Diagnosis present

## 2016-07-02 DIAGNOSIS — Z9049 Acquired absence of other specified parts of digestive tract: Secondary | ICD-10-CM

## 2016-07-02 DIAGNOSIS — I739 Peripheral vascular disease, unspecified: Secondary | ICD-10-CM | POA: Diagnosis present

## 2016-07-02 DIAGNOSIS — Z825 Family history of asthma and other chronic lower respiratory diseases: Secondary | ICD-10-CM

## 2016-07-02 DIAGNOSIS — Z8701 Personal history of pneumonia (recurrent): Secondary | ICD-10-CM

## 2016-07-02 DIAGNOSIS — M129 Arthropathy, unspecified: Secondary | ICD-10-CM | POA: Diagnosis present

## 2016-07-02 DIAGNOSIS — M797 Fibromyalgia: Secondary | ICD-10-CM | POA: Diagnosis present

## 2016-07-02 DIAGNOSIS — Z974 Presence of external hearing-aid: Secondary | ICD-10-CM

## 2016-07-02 DIAGNOSIS — Z853 Personal history of malignant neoplasm of breast: Secondary | ICD-10-CM

## 2016-07-02 DIAGNOSIS — F17200 Nicotine dependence, unspecified, uncomplicated: Secondary | ICD-10-CM | POA: Diagnosis present

## 2016-07-02 DIAGNOSIS — E785 Hyperlipidemia, unspecified: Secondary | ICD-10-CM | POA: Diagnosis present

## 2016-07-02 DIAGNOSIS — Z7982 Long term (current) use of aspirin: Secondary | ICD-10-CM

## 2016-07-02 DIAGNOSIS — C50919 Malignant neoplasm of unspecified site of unspecified female breast: Secondary | ICD-10-CM | POA: Diagnosis present

## 2016-07-02 DIAGNOSIS — H9190 Unspecified hearing loss, unspecified ear: Secondary | ICD-10-CM

## 2016-07-02 DIAGNOSIS — Z882 Allergy status to sulfonamides status: Secondary | ICD-10-CM

## 2016-07-02 DIAGNOSIS — K219 Gastro-esophageal reflux disease without esophagitis: Secondary | ICD-10-CM | POA: Diagnosis present

## 2016-07-02 DIAGNOSIS — Z808 Family history of malignant neoplasm of other organs or systems: Secondary | ICD-10-CM

## 2016-07-02 DIAGNOSIS — Z720 Tobacco use: Secondary | ICD-10-CM | POA: Diagnosis present

## 2016-07-02 DIAGNOSIS — R0602 Shortness of breath: Secondary | ICD-10-CM | POA: Diagnosis not present

## 2016-07-02 DIAGNOSIS — J441 Chronic obstructive pulmonary disease with (acute) exacerbation: Principal | ICD-10-CM | POA: Diagnosis present

## 2016-07-02 DIAGNOSIS — Z800 Family history of malignant neoplasm of digestive organs: Secondary | ICD-10-CM

## 2016-07-02 DIAGNOSIS — I10 Essential (primary) hypertension: Secondary | ICD-10-CM | POA: Diagnosis not present

## 2016-07-02 DIAGNOSIS — H9193 Unspecified hearing loss, bilateral: Secondary | ICD-10-CM | POA: Diagnosis present

## 2016-07-02 DIAGNOSIS — D599 Acquired hemolytic anemia, unspecified: Secondary | ICD-10-CM | POA: Diagnosis present

## 2016-07-02 DIAGNOSIS — Z9013 Acquired absence of bilateral breasts and nipples: Secondary | ICD-10-CM

## 2016-07-02 DIAGNOSIS — Z87891 Personal history of nicotine dependence: Secondary | ICD-10-CM

## 2016-07-02 DIAGNOSIS — K551 Chronic vascular disorders of intestine: Secondary | ICD-10-CM | POA: Diagnosis present

## 2016-07-02 DIAGNOSIS — R262 Difficulty in walking, not elsewhere classified: Secondary | ICD-10-CM

## 2016-07-02 DIAGNOSIS — E039 Hypothyroidism, unspecified: Secondary | ICD-10-CM | POA: Diagnosis present

## 2016-07-02 DIAGNOSIS — Z7951 Long term (current) use of inhaled steroids: Secondary | ICD-10-CM

## 2016-07-02 DIAGNOSIS — Z90710 Acquired absence of both cervix and uterus: Secondary | ICD-10-CM

## 2016-07-02 DIAGNOSIS — J9621 Acute and chronic respiratory failure with hypoxia: Secondary | ICD-10-CM | POA: Diagnosis present

## 2016-07-02 DIAGNOSIS — Z23 Encounter for immunization: Secondary | ICD-10-CM

## 2016-07-02 LAB — COMPREHENSIVE METABOLIC PANEL
ALT: 26 U/L (ref 14–54)
AST: 28 U/L (ref 15–41)
Albumin: 4.5 g/dL (ref 3.5–5.0)
Alkaline Phosphatase: 64 U/L (ref 38–126)
Anion gap: 10 (ref 5–15)
BUN: 11 mg/dL (ref 6–20)
CO2: 26 mmol/L (ref 22–32)
Calcium: 9.9 mg/dL (ref 8.9–10.3)
Chloride: 102 mmol/L (ref 101–111)
Creatinine, Ser: 0.98 mg/dL (ref 0.44–1.00)
GFR calc Af Amer: >60 mL/min (ref >60)
GFR calc non Af Amer: 57 mL/min — ABNORMAL LOW (ref >60)
Glucose, Bld: 93 mg/dL (ref 65–99)
Potassium: 4.3 mmol/L (ref 3.5–5.1)
Sodium: 138 mmol/L (ref 135–145)
Total Bilirubin: 0.7 mg/dL (ref 0.3–1.2)
Total Protein: 7.9 g/dL (ref 6.5–8.1)

## 2016-07-02 LAB — CBC WITH DIFFERENTIAL/PLATELET
Basophils Absolute: 0.1 10*3/uL (ref 0.0–0.1)
Basophils Relative: 1 %
Eosinophils Absolute: 0.9 10*3/uL — ABNORMAL HIGH (ref 0.0–0.7)
Eosinophils Relative: 12 %
HCT: 50.3 % — ABNORMAL HIGH (ref 36.0–46.0)
Hemoglobin: 16.7 g/dL — ABNORMAL HIGH (ref 12.0–15.0)
Lymphocytes Relative: 16 %
Lymphs Abs: 1.2 10*3/uL (ref 0.7–4.0)
MCH: 31.3 pg (ref 26.0–34.0)
MCHC: 33.2 g/dL (ref 30.0–36.0)
MCV: 94.2 fL (ref 78.0–100.0)
Monocytes Absolute: 0.6 10*3/uL (ref 0.1–1.0)
Monocytes Relative: 8 %
Neutro Abs: 4.7 10*3/uL (ref 1.7–7.7)
Neutrophils Relative %: 63 %
Platelets: 361 10*3/uL (ref 150–400)
RBC: 5.34 MIL/uL — ABNORMAL HIGH (ref 3.87–5.11)
RDW: 15.8 % — ABNORMAL HIGH (ref 11.5–15.5)
WBC: 7.5 10*3/uL (ref 4.0–10.5)

## 2016-07-02 LAB — TROPONIN I: Troponin I: <0.03 ng/mL (ref <0.03)

## 2016-07-02 LAB — BRAIN NATRIURETIC PEPTIDE: B Natriuretic Peptide: 28.4 pg/mL (ref 0.0–100.0)

## 2016-07-02 MED ORDER — ONDANSETRON HCL 4 MG/2ML IJ SOLN: 4.0000 mg | Freq: Four times a day (QID) | INTRAMUSCULAR | Status: DC | PRN

## 2016-07-02 MED ORDER — ENOXAPARIN SODIUM 40 MG/0.4ML ~~LOC~~ SOLN
40.0000 mg | SUBCUTANEOUS | Status: DC
  Administered 2016-07-02 – 2016-07-09 (×8): 40 mg via SUBCUTANEOUS
  Filled 2016-07-02 (×8): qty 0.4

## 2016-07-02 MED ORDER — MAGNESIUM CITRATE PO SOLN: 1.0000 | Freq: Once | ORAL | Status: DC | PRN

## 2016-07-02 MED ORDER — SODIUM CHLORIDE 0.9 % IV BOLUS (SEPSIS)
1000.0000 mL | Freq: Once | INTRAVENOUS | Status: AC
  Administered 2016-07-02: 1000 mL via INTRAVENOUS

## 2016-07-02 MED ORDER — ACETAMINOPHEN 650 MG RE SUPP: 650.0000 mg | Freq: Four times a day (QID) | RECTAL | Status: DC | PRN

## 2016-07-02 MED ORDER — GUAIFENESIN-DM 100-10 MG/5ML PO SYRP
5.0000 mL | ORAL_SOLUTION | ORAL | Status: DC | PRN
  Administered 2016-07-04 – 2016-07-07 (×9): 5 mL via ORAL
  Filled 2016-07-02 (×9): qty 5

## 2016-07-02 MED ORDER — ASPIRIN EC 81 MG PO TBEC
81.0000 mg | DELAYED_RELEASE_TABLET | Freq: Every day | ORAL | Status: DC
  Administered 2016-07-03 – 2016-07-10 (×8): 81 mg via ORAL
  Filled 2016-07-02 (×8): qty 1

## 2016-07-02 MED ORDER — AMITRIPTYLINE HCL 50 MG PO TABS
50.0000 mg | ORAL_TABLET | Freq: Every day | ORAL | Status: DC
  Administered 2016-07-02 – 2016-07-09 (×8): 50 mg via ORAL
  Filled 2016-07-02 (×8): qty 1

## 2016-07-02 MED ORDER — BUPROPION HCL ER (SR) 150 MG PO TB12
150.0000 mg | ORAL_TABLET | Freq: Two times a day (BID) | ORAL | Status: DC
  Administered 2016-07-02 – 2016-07-10 (×16): 150 mg via ORAL
  Filled 2016-07-02 (×17): qty 1

## 2016-07-02 MED ORDER — SENNOSIDES-DOCUSATE SODIUM 8.6-50 MG PO TABS: 1.0000 | ORAL_TABLET | Freq: Every evening | ORAL | Status: DC | PRN

## 2016-07-02 MED ORDER — ENOXAPARIN SODIUM 40 MG/0.4ML ~~LOC~~ SOLN
40.0000 mg | SUBCUTANEOUS | Status: DC
Start: 2016-07-02 — End: 2016-07-02

## 2016-07-02 MED ORDER — LORAZEPAM 1 MG PO TABS
1.0000 mg | ORAL_TABLET | Freq: Every evening | ORAL | Status: DC | PRN
  Administered 2016-07-02 – 2016-07-09 (×8): 1 mg via ORAL
  Filled 2016-07-02 (×8): qty 1

## 2016-07-02 MED ORDER — ONDANSETRON HCL 4 MG PO TABS: 4.0000 mg | ORAL_TABLET | Freq: Four times a day (QID) | ORAL | Status: DC | PRN

## 2016-07-02 MED ORDER — AMLODIPINE BESYLATE 5 MG PO TABS
5.0000 mg | ORAL_TABLET | Freq: Every day | ORAL | Status: DC
  Administered 2016-07-02 – 2016-07-10 (×9): 5 mg via ORAL
  Filled 2016-07-02 (×9): qty 1

## 2016-07-02 MED ORDER — ALBUTEROL SULFATE (2.5 MG/3ML) 0.083% IN NEBU
2.5000 mg | INHALATION_SOLUTION | RESPIRATORY_TRACT | Status: DC | PRN
  Administered 2016-07-02 – 2016-07-03 (×2): 2.5 mg via RESPIRATORY_TRACT
  Filled 2016-07-02 (×2): qty 3

## 2016-07-02 MED ORDER — CILOSTAZOL 100 MG PO TABS
100.0000 mg | ORAL_TABLET | Freq: Two times a day (BID) | ORAL | Status: DC
  Administered 2016-07-02 – 2016-07-10 (×16): 100 mg via ORAL
  Filled 2016-07-02 (×17): qty 1

## 2016-07-02 MED ORDER — IPRATROPIUM-ALBUTEROL 0.5-2.5 (3) MG/3ML IN SOLN: 3.0000 mL | RESPIRATORY_TRACT | Status: DC | PRN

## 2016-07-02 MED ORDER — PREDNISONE 20 MG PO TABS
40.0000 mg | ORAL_TABLET | Freq: Every day | ORAL | Status: DC
  Administered 2016-07-03: 40 mg via ORAL
  Filled 2016-07-02: qty 2

## 2016-07-02 MED ORDER — ALBUTEROL (5 MG/ML) CONTINUOUS INHALATION SOLN
10.0000 mg/h | INHALATION_SOLUTION | RESPIRATORY_TRACT | Status: DC
  Administered 2016-07-02: 10 mg/h via RESPIRATORY_TRACT
  Filled 2016-07-02: qty 20

## 2016-07-02 MED ORDER — BISACODYL 10 MG RE SUPP
10.0000 mg | Freq: Every day | RECTAL | Status: DC | PRN
  Filled 2016-07-02: qty 1

## 2016-07-02 MED ORDER — ALBUTEROL SULFATE (2.5 MG/3ML) 0.083% IN NEBU
5.0000 mg | INHALATION_SOLUTION | Freq: Once | RESPIRATORY_TRACT | Status: AC
  Administered 2016-07-02: 5 mg via RESPIRATORY_TRACT
  Filled 2016-07-02: qty 6

## 2016-07-02 MED ORDER — LEVOTHYROXINE SODIUM 112 MCG PO TABS
112.0000 ug | ORAL_TABLET | Freq: Every day | ORAL | Status: DC
  Administered 2016-07-03 – 2016-07-10 (×8): 112 ug via ORAL
  Filled 2016-07-02 (×8): qty 1

## 2016-07-02 MED ORDER — PANTOPRAZOLE SODIUM 40 MG PO TBEC
40.0000 mg | DELAYED_RELEASE_TABLET | Freq: Every day | ORAL | Status: DC
  Administered 2016-07-02 – 2016-07-10 (×9): 40 mg via ORAL
  Filled 2016-07-02 (×9): qty 1

## 2016-07-02 MED ORDER — METOPROLOL TARTRATE 25 MG PO TABS
25.0000 mg | ORAL_TABLET | Freq: Two times a day (BID) | ORAL | Status: DC
  Administered 2016-07-02 – 2016-07-10 (×16): 25 mg via ORAL
  Filled 2016-07-02 (×16): qty 1

## 2016-07-02 MED ORDER — IPRATROPIUM-ALBUTEROL 0.5-2.5 (3) MG/3ML IN SOLN: 3.0000 mL | Freq: Four times a day (QID) | RESPIRATORY_TRACT | Status: DC | PRN

## 2016-07-02 MED ORDER — ALBUTEROL SULFATE (2.5 MG/3ML) 0.083% IN NEBU: 2.5000 mg | INHALATION_SOLUTION | RESPIRATORY_TRACT | Status: DC

## 2016-07-02 MED ORDER — METHYLPREDNISOLONE SODIUM SUCC 125 MG IJ SOLR
125.0000 mg | Freq: Once | INTRAMUSCULAR | Status: AC
  Administered 2016-07-02: 125 mg via INTRAVENOUS
  Filled 2016-07-02: qty 2

## 2016-07-02 MED ORDER — ACETAMINOPHEN 325 MG PO TABS
650.0000 mg | ORAL_TABLET | Freq: Four times a day (QID) | ORAL | Status: DC | PRN
  Administered 2016-07-06: 650 mg via ORAL
  Filled 2016-07-02 (×2): qty 2

## 2016-07-02 MED ORDER — MOMETASONE FURO-FORMOTEROL FUM 100-5 MCG/ACT IN AERO
2.0000 | INHALATION_SPRAY | Freq: Two times a day (BID) | RESPIRATORY_TRACT | Status: DC
Start: 2016-07-02 — End: 2016-07-03
  Administered 2016-07-02 – 2016-07-03 (×2): 2 via RESPIRATORY_TRACT
  Filled 2016-07-02: qty 8.8

## 2016-07-02 MED ORDER — ATORVASTATIN CALCIUM 80 MG PO TABS
80.0000 mg | ORAL_TABLET | Freq: Every day | ORAL | Status: DC
  Administered 2016-07-02 – 2016-07-10 (×9): 80 mg via ORAL
  Filled 2016-07-02 (×9): qty 1

## 2016-07-02 NOTE — ED Notes (Signed)
Delay in lab draw pt en route to exray

## 2016-07-02 NOTE — ED Provider Notes (Signed)
[facility] DEPT Provider Note   CSN: [id number] Arrival date & time: 07/02/16  6295     History   Chief Complaint Chief Complaint  Patient presents with  . Shortness of Breath    HPI [name] is a 70 y.o. female.  HPI   Patient presents with concern of chest pain, dyspnea. Symptoms of the dyspnea began at least several weeks ago, have become worse last few days. There is associated chest tightness, anteriorly, nonradiating, sore, mild. Patient has taken 3 pulmonary medications this morning, including albuterol, with minimal change in symptoms. No ongoing fever, chills, nausea, vomiting, abdominal pain, headache. Patient has history of COPD, breast cancer, two prior lung lesion removal surgeries   Past Medical History:  Diagnosis Date  . Cancer ([place]) 2010   invasive ductal breast cancer; on tamoxifen  . Complication of anesthesia    was in ICU after lung surgery 2012  . COPD (chronic obstructive pulmonary disease) ([place])   . Diverticulitis    required colcectomy  . Emphysema of lung ([place])   . Fibromyalgia 1995  . GERD (gastroesophageal reflux disease)   . Hearing loss of both ears    uses hearing aids  bilaterally  . Hx of colonic polyps    Dr. Cristina Gong -last study '11  . Hyperlipidemia   . Hypertension   . Hypothyroidism   . Osteoporosis   . Peripheral vascular disease [facility]) May '12 - CT angio   SMA chronic occlusion; stenosis of celiac trunk.  . Varicose veins of leg with swelling    varicose vein surgery - Dr. Aleda Grana  . Wears glasses     Patient Active Problem List   Diagnosis Date Noted  . Community acquired pneumonia 05/04/2016  . Leukocytosis 05/04/2016  . Routine general medical examination at a health care facility 08/12/2015  . Insomnia 03/04/2015  . Breast cancer, right breast ([place]) 02/17/2015  . CAD (coronary artery disease) 07/11/2011  . Tobacco abuse 03/27/2011  . Chronic mesenteric ischemia ([place]) 03/27/2011  . PAD (peripheral  artery disease) ([place]) 03/04/2011  . ARTHRITIS 11/09/2010  . Malignant neoplasm of female breast ([place]) 09/20/2009  . Hypothyroidism 09/20/2009  . Hyperlipidemia 09/20/2009  . DECREASED HEARING 09/14/2008  . FIBROMYALGIA 11/18/2007  . Essential hypertension 09/08/2007  . COPD (chronic obstructive pulmonary disease) ([place]) 09/08/2007  . GERD 09/08/2007    Past Surgical History:  Procedure Laterality Date  . ABDOMINAL HYSTERECTOMY  1973   metorrhagia  . APPENDECTOMY    . BREAST SURGERY  2010   mastectomy with reconstructive surgery  . BREAST SURGERY  2010   rt lump x2  . BREAST SURGERY  2010   partial rt br reduction  . CARDIAC CATHETERIZATION  '11   no obstructive coronary disease  . COLECTOMY  2002   sigmoid  . COLONOSCOPY W/ POLYPECTOMY    . LUNG SURGERY  9/12   rt mid lung wedge  . PARTIAL MASTECTOMY WITH NEEDLE LOCALIZATION Left 02/23/2013   Procedure:  LEFT PARTIAL MASTECTOMY WITH NEEDLE LOCALIZATION;  Surgeon: Adin Hector, MD;  Location: [facility];  Service: General;  Laterality: Left;  . [place]   right upper lobe of lung as part of VAT-nodule/benign    OB History    No data available       Home Medications    Prior to Admission medications   Medication Sig Start Date End Date Taking? Authorizing Provider  albuterol (PROVENTIL HFA;VENTOLIN HFA) 108 (90 Base) MCG/ACT inhaler  Inhale 2 puffs into the lungs every 4 (four) hours as needed for wheezing. As a RESCUE medication 05/07/16   Hosie Poisson, MD  amitriptyline (ELAVIL) 50 MG tablet Take 1 tablet (50 mg total) by mouth at bedtime. 08/12/15   Hoyt Koch, MD  amLODipine (NORVASC) 5 MG tablet Take 1 tablet (5 mg total) by mouth daily. 08/16/15   Hoyt Koch, MD  amoxicillin-clavulanate (AUGMENTIN) 875-125 MG tablet Take 1 tablet by mouth 2 (two) times daily. 05/07/16   Hosie Poisson, MD  aspirin 81 MG tablet Take 81 mg by mouth daily.     Historical Provider, MD    atorvastatin (LIPITOR) 80 MG tablet Take 1 tablet (80 mg total) by mouth daily. Please call and schedule appt for further refills - [phone] - 1st attempt 06/26/16   Larey Dresser, MD  B Complex Vitamins (VITAMIN-B COMPLEX PO) Take 1 tablet by mouth daily.     Historical Provider, MD  bisacodyl (DULCOLAX) 5 MG EC tablet Take 1 tablet (5 mg total) by mouth daily as needed for moderate constipation. 08/10/14   Hoyt Koch, MD  buPROPion (WELLBUTRIN SR) 150 MG 12 hr tablet Take 1 tablet (150 mg total) by mouth 2 (two) times daily. 05/11/16   Hoyt Koch, MD  Calcium Carbonate-Vitamin D (CALCIUM + D PO) Take 600 mg by mouth daily.     Historical Provider, MD  cilostazol (PLETAL) 100 MG tablet Take 1 tablet (100 mg total) by mouth 2 (two) times daily. Please call and schedule a one year follow up appointment for further refills 07/02/16   Larey Dresser, MD  Fluticasone-Salmeterol (ADVAIR DISKUS) 100-50 MCG/DOSE AEPB Inhale 1 puff into the lungs 2 (two) times daily. 05/07/16   Hosie Poisson, MD  guaiFENesin-dextromethorphan (ROBITUSSIN DM) 100-10 MG/5ML syrup Take 5 mLs by mouth every 4 (four) hours as needed for cough. 05/07/16   Hosie Poisson, MD  ipratropium-albuterol (DUONEB) 0.5-2.5 (3) MG/3ML SOLN Take 3 mLs by nebulization every 6 (six) hours as needed. 05/07/16   Hosie Poisson, MD  levothyroxine (SYNTHROID, LEVOTHROID) 112 MCG tablet TAKE ONE TABLET BY MOUTH ONE TIME DAILY 08/16/15   Hoyt Koch, MD  LORazepam (ATIVAN) 2 MG tablet TAKE 1 TABLET BY MOUTH AT BEDTIME AS NEEDED FOR ANXIETY 06/15/16   Hoyt Koch, MD  metoprolol tartrate (LOPRESSOR) 25 MG tablet Take 1 tablet (25 mg total) by mouth 2 (two) times daily. 08/16/15   Hoyt Koch, MD  nicotine (NICODERM CQ - DOSED IN MG/24 HOURS) 21 mg/24hr patch Place 1 patch (21 mg total) onto the skin daily. Patient not taking: Reported on 05/11/2016 05/07/16   Hosie Poisson, MD  omeprazole (PRILOSEC) 40 MG capsule  Take 1 capsule (40 mg total) by mouth 2 (two) times daily. 08/16/15   Hoyt Koch, MD  predniSONE (DELTASONE) 20 MG tablet Prednisone 60 mg daily for 2 more days followed by  Prednisone 40 mg daily for 3 days followed by  Prednisone 20 mg daily for 3 days 05/07/16   Hosie Poisson, MD  tiotropium (SPIRIVA HANDIHALER) 18 MCG inhalation capsule Place 1 capsule (18 mcg total) into inhaler and inhale daily. 08/12/15   Hoyt Koch, MD    Family History Family History  Problem Relation Age of Onset  . Colon cancer Mother     colon  . COPD Mother     brown lung  . Hypertension Mother   . Diabetes Mother   . Emphysema Mother   .  Coronary artery disease Father   . Heart attack Father   . Sudden death Father   . Heart disease Father   . Cancer Sister     fallopian tube  . Hypertension Sister   . Coronary artery disease Brother   . Throat cancer Sister   . Melanoma Sister   . Hypertension Sister   . Pancreatic cancer Sister   . Cancer Sister   . Heart attack Brother     early 30's  . Heart failure Sister     Social History Social History  Substance Use Topics  . Smoking status: Current Every Day Smoker    Packs/day: 0.75    Types: Cigarettes    Last attempt to quit: 03/18/2012  . Smokeless tobacco: Never Used     Comment: smokes less   . Alcohol use 6.0 oz/week    10 Cans of beer per week     Comment: occ     Allergies   Sulfonamide derivatives   Review of Systems Review of Systems  Constitutional:       Per HPI, otherwise negative  HENT:       Per HPI, otherwise negative  Respiratory:       Per HPI, otherwise negative  Cardiovascular:       Per HPI, otherwise negative  Gastrointestinal: Negative for vomiting.  Endocrine:       Negative aside from HPI  Genitourinary:       Neg aside from HPI   Musculoskeletal:       Per HPI, otherwise negative  Skin: Negative.   Neurological: Negative for syncope.     Physical Exam Updated Vital Signs BP  123/90 (BP Location: Left Arm)   Pulse 84   Temp 98.5 F (36.9 C) (Oral)   Resp 22   Ht '5\' 6"'$  (1.676 m)   Wt 140 lb (63.5 kg)   SpO2 (S) (!) 85%   BMI 22.60 kg/m   Physical Exam  Constitutional: She is oriented to person, place, and time. She appears well-developed and well-nourished. No distress.  HENT:  Head: Normocephalic and atraumatic.  Eyes: Conjunctivae and EOM are normal.  Cardiovascular: Normal rate and regular rhythm.   Pulmonary/Chest: Accessory muscle usage present. No stridor. Tachypnea noted. No respiratory distress. She has decreased breath sounds. She has wheezes.  Abdominal: She exhibits no distension.  Musculoskeletal: She exhibits no edema.  Neurological: She is alert and oriented to person, place, and time. No cranial nerve deficit.  Skin: Skin is warm and dry.  Psychiatric: She has a normal mood and affect.  Nursing note and vitals reviewed.  Patient hypoxic on room air, 88%, abnormal, improved to 95% with supplemental oxygen, abnormal   ED Treatments / Results  Labs (all labs ordered are listed, but only abnormal results are displayed) Labs Reviewed  COMPREHENSIVE METABOLIC PANEL - Abnormal; Notable for the following:       Result Value   GFR calc non Af Amer 57 (*)    All other components within normal limits  CBC WITH DIFFERENTIAL/PLATELET - Abnormal; Notable for the following:    RBC 5.34 (*)    Hemoglobin 16.7 (*)    HCT 50.3 (*)    RDW 15.8 (*)    Eosinophils Absolute 0.9 (*)    All other components within normal limits  TROPONIN I  BRAIN NATRIURETIC PEPTIDE    EKG  EKG Interpretation  Date/Time:  Monday July 02 2016 12:54:51 EDT Ventricular Rate:  79 PR Interval:  QRS Duration: 119 QT Interval:  379 QTC Calculation: 435 R Axis:   97 Text Interpretation:  Sinus rhythm Artifact T wave abnormality Abnormal ekg Confirmed by Carmin Muskrat  MD [phone]) on 07/02/2016 3:14:12 PM       Radiology Dg Chest 2 View  Result Date:  07/02/2016 CLINICAL DATA:  Short of breath EXAM: CHEST  2 VIEW COMPARISON:  06/06/2016 FINDINGS: Advanced COPD with hyperinflation and emphysema. Postop resection right upper lobe. Continued improvement in left upper lobe infiltrate which has nearly completely cleared. No underlying mass or adenopathy. No pleural effusion. Heart size and vascularity normal. IMPRESSION: Advanced COPD.  No acute abnormality. Electronically Signed   By: Franchot Gallo M.D.   On: 07/02/2016 12:07    Procedures Procedures (including critical care time)  Medications Ordered in ED Medications  albuterol (PROVENTIL,VENTOLIN) solution continuous neb (0 mg/hr Nebulization Stopped 07/02/16 1430)  albuterol (PROVENTIL) (2.5 MG/3ML) 0.083% nebulizer solution 5 mg (5 mg Nebulization Given 07/02/16 1240)  methylPREDNISolone sodium succinate (SOLU-MEDROL) 125 mg/2 mL injection 125 mg (125 mg Intravenous Given 07/02/16 1240)  sodium chloride 0.9 % bolus 1,000 mL (1,000 mLs Intravenous New Bag/Given 07/02/16 1400)     Initial Impression / Assessment and Plan / ED Course  I have reviewed the triage vital signs and the nursing notes.  Pertinent labs & imaging results that were available during my care of the patient were reviewed by me and considered in my medical decision making (see chart for details).  Clinical Course    1:24 PM After initial neb patient has improved WOB, less wheezing  3:26 PM Following a one-hour continuous have the patient has had persistent hypoxia, increased work of breathing, bilateral wheezing. Patient will be admitted.  Final Clinical Impressions(s) / ED Diagnoses  Patient with history of smoking, COPD, presents with worsening dyspnea. On initial exam the patient has increased work of breathing, bilateral wheezing. Patient had some improvement with steroids, multiple nebulized treatments, but had persistent hypoxia, wheezing, required admission for further evaluation and management.   Carmin Muskrat, MD 07/02/16 [phone]

## 2016-07-02 NOTE — Telephone Encounter (Signed)
Patient Name: [name]  DOB: 10/16/1945    Initial Comment Caller is having a hard time breathing- O2 is down to about 82 and 84   Nurse Assessment  Nurse: Raphael Gibney, RN, Vera Date/Time (Eastern Time): 07/02/2016 9:02:18 AM  Confirm and document reason for call. If symptomatic, describe symptoms. You must click the next button to save text entered. ---Caller states she had SOB during the night. She has used her inhaler and breathing machine. Had pneumonia 2 months ago. Oxygen level 82 and 84. Sounds SOB on the phone. Chest pain which hurts worse when she breathes. She has a non productive cough. Had fever earlier but not now.  Has the patient traveled out of the country within the last 30 days? ---No  Does the patient have any new or worsening symptoms? ---Yes  Will a triage be completed? ---Yes  Related visit to physician within the last 2 weeks? ---No  Does the PT have any chronic conditions? (i.e. diabetes, asthma, etc.) ---Yes  List chronic conditions. ---emphysema  Is this a behavioral health or substance abuse call? ---No     Guidelines    Guideline Title Affirmed Question Affirmed Notes  Chest Pain [1] Chest pain lasts > 5 minutes AND [2] described as crushing, pressure-like, or heavy    Final Disposition User   Call EMS 911 Now Raphael Gibney, RN, Vera    Comments  Pt states she does not want to call 911 but will have her spouse take her to the hospital.   Disagree/Comply: Disagree  Disagree/Comply Reason: Disagree with instructions

## 2016-07-02 NOTE — ED Triage Notes (Signed)
Pt c/o shortness of breath worse in past 2 days -- pt has been checking O2 sats at home, they have been 80's-- has been coughing worse in past 2 days-- NOT on home O2

## 2016-07-02 NOTE — ED Notes (Signed)
Nurse will draw labs. 

## 2016-07-02 NOTE — ED Notes (Signed)
Attempted report to 6N 

## 2016-07-02 NOTE — H&P (Signed)
History and Physical    SHIVANI BARRANTES TMH:[id number] DOB: 02-24-1946 DOA: 07/02/2016   PCP: Hoyt Koch, MD   Patient coming from:  Home   Chief Complaint: Shortness of breath   HPI: [name] is a 70 y.o. female with medical history significant  R breast cancer stage on remission, COPD, history of RUL lung masss/p Wedge resection 1993, presenting with progressive onset of dyspnea, at lest present for 5 weeks, worse over the last 3-4 days, associated with lower chest tightness, anteriorly, mild, non radiating. Denies any fever or chils or night sweats, no recnet sick contacts or travels.Currently not smoking, discontinuing tobacco 2 months ago. She is compliant with her pulmonary meds. Denies any headaches or vision changes. No cardiac complaints. No abdominal pain. No lower extremity swelling. No new changes in her COPD meds.     ED Course:  BP 123/90 (BP Location: Left Arm)   Pulse 84   Temp 98.5 F (36.9 C) (Oral)   Resp 22   Ht '5\' 6"'$  (1.676 m)   Wt 63.5 kg (140 lb)   SpO2 96%   BMI 22.60 kg/m    She was in distress, using accessory muscle and tachypneic, wheezing was present WBC 7.5, CXR NAD, Received Nebs x2 and Solumedrol x1 with some relief but symptoms not resolved continues to wheeze. BNP and Tn normal .  Of note, Hb 16.7 in the setting of COPD, baseline 15  will continue to be monitored   Review of Systems: As per HPI otherwise 10 point review of systems negative.   Past Medical History:  Diagnosis Date  . Cancer ([place]) 2010   invasive ductal breast cancer; on tamoxifen  . Complication of anesthesia    was in ICU after lung surgery 2012  . COPD (chronic obstructive pulmonary disease) ([place])   . Diverticulitis    required colcectomy  . Emphysema of lung ([place])   . Fibromyalgia 1995  . GERD (gastroesophageal reflux disease)   . Hearing loss of both ears    uses hearing aids  bilaterally  . Hx of colonic polyps    Dr. Cristina Gong -last study '11  .  Hyperlipidemia   . Hypertension   . Hypothyroidism   . Osteoporosis   . Peripheral vascular disease [facility]) May '12 - CT angio   SMA chronic occlusion; stenosis of celiac trunk.  . Varicose veins of leg with swelling    varicose vein surgery - Dr. Aleda Grana  . Wears glasses     Past Surgical History:  Procedure Laterality Date  . ABDOMINAL HYSTERECTOMY  1973   metorrhagia  . APPENDECTOMY    . BREAST SURGERY  2010   mastectomy with reconstructive surgery  . BREAST SURGERY  2010   rt lump x2  . BREAST SURGERY  2010   partial rt br reduction  . CARDIAC CATHETERIZATION  '11   no obstructive coronary disease  . COLECTOMY  2002   sigmoid  . COLONOSCOPY W/ POLYPECTOMY    . LUNG SURGERY  9/12   rt mid lung wedge  . PARTIAL MASTECTOMY WITH NEEDLE LOCALIZATION Left 02/23/2013   Procedure:  LEFT PARTIAL MASTECTOMY WITH NEEDLE LOCALIZATION;  Surgeon: Adin Hector, MD;  Location: [place];  Service: General;  Laterality: Left;  . [place]   right upper lobe of lung as part of VAT-nodule/benign    Social History Social History   Social History  . Marital status: Married    Spouse  name: N/A  . Number of children: 3  . Years of education: 12   Occupational History  . beautician Retired    retired   Social History Main Topics  . Smoking status: Current Every Day Smoker    Packs/day: 0.75    Types: Cigarettes    Last attempt to quit: 03/18/2012  . Smokeless tobacco: Never Used     Comment: smokes less   . Alcohol use 6.0 oz/week    10 Cans of beer per week     Comment: occ  . Drug use: No  . Sexual activity: Not Currently   Other Topics Concern  . Not on file   Social History Narrative   HSG, beauty school. Married '69 -70 yrs/widowed; married '79 - 68yrdivorced; married '87.  2 sons - '70, '74, 1 srtep-son; 1 grandchild. Work - bInsurance claims handler40 yrs, retired. SO - good health.  NO history of abuse.  ACP - full code and all heroic measures.        Allergies  Allergen Reactions  . Sulfonamide Derivatives Swelling    Family History  Problem Relation Age of Onset  . Colon cancer Mother     colon  . COPD Mother     brown lung  . Hypertension Mother   . Diabetes Mother   . Emphysema Mother   . Coronary artery disease Father   . Heart attack Father   . Sudden death Father   . Heart disease Father   . Cancer Sister     fallopian tube  . Hypertension Sister   . Coronary artery disease Brother   . Throat cancer Sister   . Melanoma Sister   . Hypertension Sister   . Pancreatic cancer Sister   . Cancer Sister   . Heart attack Brother     early 514's . Heart failure Sister       Prior to Admission medications   Medication Sig Start Date End Date Taking? Authorizing Provider  albuterol (PROVENTIL HFA;VENTOLIN HFA) 108 (90 Base) MCG/ACT inhaler Inhale 2 puffs into the lungs every 4 (four) hours as needed for wheezing. As a RESCUE medication 05/07/16   VHosie Poisson MD  amitriptyline (ELAVIL) 50 MG tablet Take 1 tablet (50 mg total) by mouth at bedtime. 08/12/15   EHoyt Koch MD  amLODipine (NORVASC) 5 MG tablet Take 1 tablet (5 mg total) by mouth daily. 08/16/15   EHoyt Koch MD  amoxicillin-clavulanate (AUGMENTIN) 875-125 MG tablet Take 1 tablet by mouth 2 (two) times daily. 05/07/16   VHosie Poisson MD  aspirin 81 MG tablet Take 81 mg by mouth daily.     Historical Provider, MD  atorvastatin (LIPITOR) 80 MG tablet Take 1 tablet (80 mg total) by mouth daily. Please call and schedule appt for further refills - [phone] - 1st attempt 06/26/16   DLarey Dresser MD  B Complex Vitamins (VITAMIN-B COMPLEX PO) Take 1 tablet by mouth daily.     Historical Provider, MD  bisacodyl (DULCOLAX) 5 MG EC tablet Take 1 tablet (5 mg total) by mouth daily as needed for moderate constipation. 08/10/14   EHoyt Koch MD  buPROPion (WELLBUTRIN SR) 150 MG 12 hr tablet Take 1 tablet (150 mg total) by mouth 2 (two)  times daily. 05/11/16   EHoyt Koch MD  Calcium Carbonate-Vitamin D (CALCIUM + D PO) Take 600 mg by mouth daily.     Historical Provider, MD  cilostazol (PLETAL) 100 MG tablet Take 1  tablet (100 mg total) by mouth 2 (two) times daily. Please call and schedule a one year follow up appointment for further refills 07/02/16   Larey Dresser, MD  Fluticasone-Salmeterol (ADVAIR DISKUS) 100-50 MCG/DOSE AEPB Inhale 1 puff into the lungs 2 (two) times daily. 05/07/16   Hosie Poisson, MD  guaiFENesin-dextromethorphan (ROBITUSSIN DM) 100-10 MG/5ML syrup Take 5 mLs by mouth every 4 (four) hours as needed for cough. 05/07/16   Hosie Poisson, MD  ipratropium-albuterol (DUONEB) 0.5-2.5 (3) MG/3ML SOLN Take 3 mLs by nebulization every 6 (six) hours as needed. 05/07/16   Hosie Poisson, MD  levothyroxine (SYNTHROID, LEVOTHROID) 112 MCG tablet TAKE ONE TABLET BY MOUTH ONE TIME DAILY 08/16/15   Hoyt Koch, MD  LORazepam (ATIVAN) 2 MG tablet TAKE 1 TABLET BY MOUTH AT BEDTIME AS NEEDED FOR ANXIETY 06/15/16   Hoyt Koch, MD  metoprolol tartrate (LOPRESSOR) 25 MG tablet Take 1 tablet (25 mg total) by mouth 2 (two) times daily. 08/16/15   Hoyt Koch, MD  nicotine (NICODERM CQ - DOSED IN MG/24 HOURS) 21 mg/24hr patch Place 1 patch (21 mg total) onto the skin daily. Patient not taking: Reported on 05/11/2016 05/07/16   Hosie Poisson, MD  omeprazole (PRILOSEC) 40 MG capsule Take 1 capsule (40 mg total) by mouth 2 (two) times daily. 08/16/15   Hoyt Koch, MD  predniSONE (DELTASONE) 20 MG tablet Prednisone 60 mg daily for 2 more days followed by  Prednisone 40 mg daily for 3 days followed by  Prednisone 20 mg daily for 3 days 05/07/16   Hosie Poisson, MD  tiotropium (SPIRIVA HANDIHALER) 18 MCG inhalation capsule Place 1 capsule (18 mcg total) into inhaler and inhale daily. 08/12/15   Hoyt Koch, MD    Physical Exam:    Vitals:   07/02/16 1118 07/02/16 1120 07/02/16 1335  BP:  123/90    Pulse: 84    Resp: 22    Temp: 98.5 F (36.9 C)    TempSrc: Oral    SpO2: (S) (!) 85%  96%  Weight:  63.5 kg (140 lb)   Height:  '5\' 6"'$  (1.676 m)        Constitutional: NAD, calm, less uncomfortable from admission  Vitals:   07/02/16 1118 07/02/16 1120 07/02/16 1335  BP: 123/90    Pulse: 84    Resp: 22    Temp: 98.5 F (36.9 C)    TempSrc: Oral    SpO2: (S) (!) 85%  96%  Weight:  63.5 kg (140 lb)   Height:  '5\' 6"'$  (1.676 m)    Eyes: PERRL, lids and conjunctivae normal ENMT: Mucous membranes are moist. Posterior pharynx clear of any exudate or lesions.Normal dentition.  Neck: normal, supple, no masses, no thyromegaly Respiratory:+bilateral  Wheezing and rhonchi, no crackles. Normal respiratory effort. No accessory muscle use at this time  Right mastectomy  Cardiovascular: Regular rate and rhythm, no murmurs / rubs / gallops. No extremity edema. 2+ pedal pulses. No carotid bruits.  Abdomen: no tenderness, no masses palpated. No hepatosplenomegaly. Bowel sounds positive.  Musculoskeletal: no clubbing / cyanosis. No joint deformity upper and lower extremities. Good ROM, no contractures. Normal muscle tone.  Skin: no rashes, lesions, ulcers.  Neurologic: CN 2-12 grossly intact. Sensation intact, DTR normal. Strength 5/5 in all 4.  Psychiatric: Normal judgment and insight. Alert and oriented x 3. Normal mood.     Labs on Admission: I have personally reviewed following labs and imaging studies  CBC:  Recent  Labs Lab 07/02/16 1235  WBC 7.5  NEUTROABS 4.7  HGB 16.7*  HCT 50.3*  MCV 94.2  PLT 536    Basic Metabolic Panel:  Recent Labs Lab 07/02/16 1235  NA 138  K 4.3  CL 102  CO2 26  GLUCOSE 93  BUN 11  CREATININE 0.98  CALCIUM 9.9    GFR: Estimated Creatinine Clearance: 49.3 mL/min (by C-G formula based on SCr of 0.98 mg/dL).  Liver Function Tests:  Recent Labs Lab 07/02/16 1235  AST 28  ALT 26  ALKPHOS 64  BILITOT 0.7  PROT 7.9    ALBUMIN 4.5   No results for input(s): LIPASE, AMYLASE in the last 168 hours. No results for input(s): AMMONIA in the last 168 hours.  Coagulation Profile: No results for input(s): INR, PROTIME in the last 168 hours.  Cardiac Enzymes:  Recent Labs Lab 07/02/16 1235  TROPONINI <0.03    BNP (last 3 results) No results for input(s): PROBNP in the last 8760 hours.  HbA1C: No results for input(s): HGBA1C in the last 72 hours.  CBG: No results for input(s): GLUCAP in the last 168 hours.  Lipid Profile: No results for input(s): CHOL, HDL, LDLCALC, TRIG, CHOLHDL, LDLDIRECT in the last 72 hours.  Thyroid Function Tests: No results for input(s): TSH, T4TOTAL, FREET4, T3FREE, THYROIDAB in the last 72 hours.  Anemia Panel: No results for input(s): VITAMINB12, FOLATE, FERRITIN, TIBC, IRON, RETICCTPCT in the last 72 hours.  Urine analysis:    Component Value Date/Time   COLORURINE YELLOW 03/23/2013 0248   APPEARANCEUR CLEAR 03/23/2013 0248   LABSPEC 1.019 03/23/2013 0248   LABSPEC 1.030 03/08/2009 1016   PHURINE 6.0 03/23/2013 0248   GLUCOSEU NEGATIVE 03/23/2013 0248   GLUCOSEU NEGATIVE 09/07/2009 0752   HGBUR NEGATIVE 03/23/2013 0248   HGBUR negative 11/09/2010 0958   BILIRUBINUR NEGATIVE 03/23/2013 0248   BILIRUBINUR Negative 03/08/2009 1016   KETONESUR NEGATIVE 03/23/2013 0248   PROTEINUR NEGATIVE 03/23/2013 0248   UROBILINOGEN 0.2 03/23/2013 0248   NITRITE NEGATIVE 03/23/2013 0248   LEUKOCYTESUR NEGATIVE 03/23/2013 0248   LEUKOCYTESUR Negative 03/08/2009 1016    Sepsis Labs: '@LABRCNTIP'$ (procalcitonin:4,lacticidven:4) )No results found for this or any previous visit (from the past 240 hour(s)).   Radiological Exams on Admission: Dg Chest 2 View  Result Date: 07/02/2016 CLINICAL DATA:  Short of breath EXAM: CHEST  2 VIEW COMPARISON:  06/06/2016 FINDINGS: Advanced COPD with hyperinflation and emphysema. Postop resection right upper lobe. Continued improvement in  left upper lobe infiltrate which has nearly completely cleared. No underlying mass or adenopathy. No pleural effusion. Heart size and vascularity normal. IMPRESSION: Advanced COPD.  No acute abnormality. Electronically Signed   By: Franchot Gallo M.D.   On: 07/02/2016 12:07    EKG: Independently reviewed.  Assessment/Plan Active Problems:   COPD (chronic obstructive pulmonary disease) (HCC)   Malignant neoplasm of female breast (HCC)   Hypothyroidism   Hyperlipidemia   Hearing loss   Essential hypertension   GERD   Arthropathy   PAD (peripheral artery disease) (H[facility])   Tobacco abuse   Chronic mesenteric ischemia (H[facility])   CAD (coronary artery disease)   COPD exacerbation ([place])   . Acute respiratory failure likely due to  acute COPD exacerbation. WBC 7.5, CXR NAD, Received Nebs x2 and Solumedrol x1 with some relief but symptoms not resolved continues to wheeze. BNP and Tn normal .  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 Continue ASA, Pletal  statin, beta blockers, Ca ch blocker     GERD, no acute symptoms: Continue PPI   Hypertension BP 123/90 (BP Location: Left Arm)   Pulse 84    Continue home anti-hypertensive medications     Hyperlipidemia Continue home statins   Hypothyroidism: -Continue home Synthroid    DVT prophylaxis: Lovenox  Code Status:   Full    Family Communication:  Discussed with patient  Disposition Plan: Expect patient to be discharged to home after condition improves Consults called:    None Admission status:  Medsurg    , E, PA-C [facility]   If 7PM-7AM, please contact night-coverage [email or web address] Password  [email or web address]  07/02/2016, 3:52 PM

## 2016-07-02 NOTE — ED Notes (Signed)
Report given and received

## 2016-07-02 NOTE — ED Notes (Addendum)
Applied 2 L because patient O2 sats were 88%

## 2016-07-03 ENCOUNTER — Encounter (HOSPITAL_COMMUNITY): Payer: Self-pay | Admitting: General Practice

## 2016-07-03 ENCOUNTER — Observation Stay (HOSPITAL_COMMUNITY): Payer: Medicare Other

## 2016-07-03 DIAGNOSIS — Z23 Encounter for immunization: Secondary | ICD-10-CM | POA: Diagnosis not present

## 2016-07-03 DIAGNOSIS — Z720 Tobacco use: Secondary | ICD-10-CM

## 2016-07-03 DIAGNOSIS — I10 Essential (primary) hypertension: Secondary | ICD-10-CM

## 2016-07-03 DIAGNOSIS — E039 Hypothyroidism, unspecified: Secondary | ICD-10-CM | POA: Diagnosis present

## 2016-07-03 DIAGNOSIS — J9601 Acute respiratory failure with hypoxia: Secondary | ICD-10-CM | POA: Diagnosis not present

## 2016-07-03 DIAGNOSIS — J209 Acute bronchitis, unspecified: Secondary | ICD-10-CM | POA: Diagnosis not present

## 2016-07-03 DIAGNOSIS — M797 Fibromyalgia: Secondary | ICD-10-CM | POA: Diagnosis present

## 2016-07-03 DIAGNOSIS — E785 Hyperlipidemia, unspecified: Secondary | ICD-10-CM

## 2016-07-03 DIAGNOSIS — J441 Chronic obstructive pulmonary disease with (acute) exacerbation: Secondary | ICD-10-CM | POA: Diagnosis present

## 2016-07-03 DIAGNOSIS — J9621 Acute and chronic respiratory failure with hypoxia: Secondary | ICD-10-CM | POA: Diagnosis present

## 2016-07-03 DIAGNOSIS — Z90710 Acquired absence of both cervix and uterus: Secondary | ICD-10-CM | POA: Diagnosis not present

## 2016-07-03 DIAGNOSIS — Z8701 Personal history of pneumonia (recurrent): Secondary | ICD-10-CM | POA: Diagnosis not present

## 2016-07-03 DIAGNOSIS — M129 Arthropathy, unspecified: Secondary | ICD-10-CM | POA: Diagnosis present

## 2016-07-03 DIAGNOSIS — J432 Centrilobular emphysema: Secondary | ICD-10-CM | POA: Diagnosis not present

## 2016-07-03 DIAGNOSIS — Z87891 Personal history of nicotine dependence: Secondary | ICD-10-CM | POA: Diagnosis not present

## 2016-07-03 DIAGNOSIS — K219 Gastro-esophageal reflux disease without esophagitis: Secondary | ICD-10-CM

## 2016-07-03 DIAGNOSIS — Z9049 Acquired absence of other specified parts of digestive tract: Secondary | ICD-10-CM | POA: Diagnosis not present

## 2016-07-03 DIAGNOSIS — Z800 Family history of malignant neoplasm of digestive organs: Secondary | ICD-10-CM | POA: Diagnosis not present

## 2016-07-03 DIAGNOSIS — Z808 Family history of malignant neoplasm of other organs or systems: Secondary | ICD-10-CM | POA: Diagnosis not present

## 2016-07-03 DIAGNOSIS — Z853 Personal history of malignant neoplasm of breast: Secondary | ICD-10-CM | POA: Diagnosis not present

## 2016-07-03 DIAGNOSIS — I739 Peripheral vascular disease, unspecified: Secondary | ICD-10-CM

## 2016-07-03 DIAGNOSIS — I2510 Atherosclerotic heart disease of native coronary artery without angina pectoris: Secondary | ICD-10-CM | POA: Diagnosis present

## 2016-07-03 DIAGNOSIS — K551 Chronic vascular disorders of intestine: Secondary | ICD-10-CM | POA: Diagnosis present

## 2016-07-03 DIAGNOSIS — Z974 Presence of external hearing-aid: Secondary | ICD-10-CM | POA: Diagnosis not present

## 2016-07-03 DIAGNOSIS — J438 Other emphysema: Secondary | ICD-10-CM

## 2016-07-03 DIAGNOSIS — D599 Acquired hemolytic anemia, unspecified: Secondary | ICD-10-CM | POA: Diagnosis present

## 2016-07-03 DIAGNOSIS — H9193 Unspecified hearing loss, bilateral: Secondary | ICD-10-CM | POA: Diagnosis present

## 2016-07-03 DIAGNOSIS — R0602 Shortness of breath: Secondary | ICD-10-CM | POA: Diagnosis present

## 2016-07-03 DIAGNOSIS — Z7982 Long term (current) use of aspirin: Secondary | ICD-10-CM | POA: Diagnosis not present

## 2016-07-03 DIAGNOSIS — Z9013 Acquired absence of bilateral breasts and nipples: Secondary | ICD-10-CM | POA: Diagnosis not present

## 2016-07-03 DIAGNOSIS — R0600 Dyspnea, unspecified: Secondary | ICD-10-CM | POA: Diagnosis not present

## 2016-07-03 DIAGNOSIS — Z825 Family history of asthma and other chronic lower respiratory diseases: Secondary | ICD-10-CM | POA: Diagnosis not present

## 2016-07-03 LAB — CBC
HCT: 43.8 % (ref 36.0–46.0)
Hemoglobin: 14.5 g/dL (ref 12.0–15.0)
MCH: 30.9 pg (ref 26.0–34.0)
MCHC: 33.1 g/dL (ref 30.0–36.0)
MCV: 93.4 fL (ref 78.0–100.0)
Platelets: 354 10*3/uL (ref 150–400)
RBC: 4.69 MIL/uL (ref 3.87–5.11)
RDW: 16 % — ABNORMAL HIGH (ref 11.5–15.5)
WBC: 7 10*3/uL (ref 4.0–10.5)

## 2016-07-03 LAB — COMPREHENSIVE METABOLIC PANEL
ALT: 22 U/L (ref 14–54)
AST: 20 U/L (ref 15–41)
Albumin: 3.7 g/dL (ref 3.5–5.0)
Alkaline Phosphatase: 51 U/L (ref 38–126)
Anion gap: 7 (ref 5–15)
BUN: 11 mg/dL (ref 6–20)
CO2: 27 mmol/L (ref 22–32)
Calcium: 9.5 mg/dL (ref 8.9–10.3)
Chloride: 105 mmol/L (ref 101–111)
Creatinine, Ser: 0.79 mg/dL (ref 0.44–1.00)
GFR calc Af Amer: >60 mL/min (ref >60)
GFR calc non Af Amer: >60 mL/min (ref >60)
Glucose, Bld: 110 mg/dL — ABNORMAL HIGH (ref 65–99)
Potassium: 4.2 mmol/L (ref 3.5–5.1)
Sodium: 139 mmol/L (ref 135–145)
Total Bilirubin: 0.5 mg/dL (ref 0.3–1.2)
Total Protein: 6.8 g/dL (ref 6.5–8.1)

## 2016-07-03 MED ORDER — BUDESONIDE 0.25 MG/2ML IN SUSP
0.2500 mg | Freq: Two times a day (BID) | RESPIRATORY_TRACT | Status: DC
  Administered 2016-07-03 – 2016-07-06 (×6): 0.25 mg via RESPIRATORY_TRACT
  Filled 2016-07-03 (×6): qty 2

## 2016-07-03 MED ORDER — FLUTICASONE PROPIONATE 50 MCG/ACT NA SUSP
2.0000 | Freq: Every day | NASAL | Status: DC
  Administered 2016-07-03 – 2016-07-10 (×8): 2 via NASAL
  Filled 2016-07-03: qty 16

## 2016-07-03 MED ORDER — IPRATROPIUM-ALBUTEROL 0.5-2.5 (3) MG/3ML IN SOLN
3.0000 mL | Freq: Four times a day (QID) | RESPIRATORY_TRACT | Status: DC
  Administered 2016-07-03 – 2016-07-05 (×8): 3 mL via RESPIRATORY_TRACT
  Filled 2016-07-03 (×9): qty 3

## 2016-07-03 MED ORDER — METHYLPREDNISOLONE SODIUM SUCC 125 MG IJ SOLR
80.0000 mg | Freq: Four times a day (QID) | INTRAMUSCULAR | Status: DC
  Administered 2016-07-03 – 2016-07-05 (×8): 80 mg via INTRAVENOUS
  Filled 2016-07-03 (×8): qty 2

## 2016-07-03 MED ORDER — INFLUENZA VAC SPLIT QUAD 0.5 ML IM SUSY
0.5000 mL | PREFILLED_SYRINGE | INTRAMUSCULAR | Status: AC
  Administered 2016-07-03: 0.5 mL via INTRAMUSCULAR

## 2016-07-03 MED ORDER — IPRATROPIUM-ALBUTEROL 0.5-2.5 (3) MG/3ML IN SOLN
3.0000 mL | RESPIRATORY_TRACT | Status: DC | PRN
  Administered 2016-07-04 – 2016-07-07 (×2): 3 mL via RESPIRATORY_TRACT
  Filled 2016-07-03 (×2): qty 3

## 2016-07-03 MED ORDER — GUAIFENESIN ER 600 MG PO TB12
1200.0000 mg | ORAL_TABLET | Freq: Two times a day (BID) | ORAL | Status: DC
  Administered 2016-07-03 – 2016-07-10 (×15): 1200 mg via ORAL
  Filled 2016-07-03 (×15): qty 2

## 2016-07-03 MED ORDER — ARFORMOTEROL TARTRATE 15 MCG/2ML IN NEBU
15.0000 ug | INHALATION_SOLUTION | Freq: Two times a day (BID) | RESPIRATORY_TRACT | Status: DC
  Administered 2016-07-03 – 2016-07-06 (×6): 15 ug via RESPIRATORY_TRACT
  Filled 2016-07-03 (×6): qty 2

## 2016-07-03 MED ORDER — LEVOFLOXACIN IN D5W 750 MG/150ML IV SOLN
750.0000 mg | INTRAVENOUS | Status: DC
  Administered 2016-07-03 – 2016-07-05 (×3): 750 mg via INTRAVENOUS
  Filled 2016-07-03 (×3): qty 150

## 2016-07-03 MED ORDER — DOXYCYCLINE HYCLATE 100 MG IV SOLR
100.0000 mg | Freq: Two times a day (BID) | INTRAVENOUS | Status: DC
  Administered 2016-07-03: 100 mg via INTRAVENOUS
  Filled 2016-07-03 (×2): qty 100

## 2016-07-03 MED ORDER — LORATADINE 10 MG PO TABS
10.0000 mg | ORAL_TABLET | Freq: Every day | ORAL | Status: DC
  Administered 2016-07-03 – 2016-07-10 (×8): 10 mg via ORAL
  Filled 2016-07-03 (×8): qty 1

## 2016-07-03 NOTE — Plan of Care (Signed)
Problem: Activity: Goal: Risk for activity intolerance will decrease Outcome: Progressing Pt ambulating well with assistance of one

## 2016-07-03 NOTE — Progress Notes (Signed)
Flu vaccine administered today as requested by pt

## 2016-07-03 NOTE — Evaluation (Signed)
Occupational Therapy Evaluation Patient Details Name: KARLISA GAUBERT MRN: [id number] DOB: 12-04-1945 Today's Date: 07/03/2016    History of Present Illness Pt admitted with COPD exacerbation, not 02 dependent at baseline. PMH: breast cancer, fibromyalgia, PVD, HTN, GERD, hypothyroidism, recent PNA.   Clinical Impression   Pt is moving well and performing ADL without assist. Began instruction in energy conservation strategies as pt has poor activity tolerance. Will follow acutely.   Follow Up Recommendations  No OT follow up    Equipment Recommendations  None recommended by OT    Recommendations for Other Services       Precautions / Restrictions Precautions Precaution Comments: watch sats Restrictions Weight Bearing Restrictions: No      Mobility Bed Mobility Overal bed mobility: Modified Independent                Transfers Overall transfer level: Independent Equipment used:  (3L 02)                  Balance                                            ADL Overall ADL's : Modified independent                                       General ADL Comments: Educated in purse lip breathing, energy conservation and pacing, left pt with handout to review.     Vision     Perception     Praxis      Pertinent Vitals/Pain Pain Assessment: No/denies pain     Hand Dominance Right   Extremity/Trunk Assessment Upper Extremity Assessment Upper Extremity Assessment: Overall WFL for tasks assessed   Lower Extremity Assessment Lower Extremity Assessment: Overall WFL for tasks assessed   Cervical / Trunk Assessment Cervical / Trunk Assessment: Normal   Communication Communication Communication: HOH (wears hearing aids)   Cognition Arousal/Alertness: Awake/alert Behavior During Therapy: WFL for tasks assessed/performed Overall Cognitive Status: Within Functional Limits for tasks assessed                     General Comments       Exercises       Shoulder Instructions      Home Living Family/patient expects to be discharged to:: Private residence Living Arrangements: Spouse/significant other Available Help at Discharge: Family;Available 24 hours/day Type of Home: House       Home Layout: Two level Alternate Level Stairs-Number of Steps: flight with landing   Bathroom Shower/Tub: Occupational psychologist: Standard     Home Equipment: None          Prior Functioning/Environment Level of Independence: Independent        Comments: drives        OT Problem List: Decreased activity tolerance   OT Treatment/Interventions: Energy conservation;Patient/family education    OT Goals(Current goals can be found in the care plan section) Acute Rehab OT Goals Patient Stated Goal: breathe better OT Goal Formulation: With patient Time For Goal Achievement: 07/10/16 Potential to Achieve Goals: Good ADL Goals Additional ADL Goal #1: Pt will state at least 3 energy conservation strategies.  OT Frequency: Min 2X/week   Barriers to D/C:  Co-evaluation              End of Session Equipment Utilized During Treatment: Oxygen Nurse Communication: Mobility status  Activity Tolerance: Patient limited by fatigue Patient left: in chair;with call bell/phone within reach;with nursing/sitter in room   Time: 1446-1510 OT Time Calculation (min): 24 min Charges:  OT General Charges $OT Visit: 1 Procedure OT Evaluation $OT Eval Moderate Complexity: 1 Procedure OT Treatments $Self Care/Home Management : 8-22 mins G-Codes:    Malka So 07/03/2016, 3:19 PM  [phone]

## 2016-07-03 NOTE — Care Management Note (Signed)
Case Management Note  Patient Details  Name: [name] MRN: [id number] Date of Birth: 1946/06/20  Subjective/Objective:                    Action/Plan: Patient does not have home oxygen . Will follow . Will need sat note and MD order if home oxygen needed.  Expected Discharge Date:                  Expected Discharge Plan:  Home/Self Care  In-House Referral:     Discharge planning Services     Post Acute Care Choice:    Choice offered to:  Patient, Spouse  DME Arranged:    DME Agency:     HH Arranged:    [place] Agency:     Status of Service:  In process, will continue to follow  If discussed at Long Length of Stay Meetings, dates discussed:    Additional Comments:  Marilu Favre, RN 07/03/2016, 11:05 AM

## 2016-07-03 NOTE — Care Management Obs Status (Signed)
[place] NOTIFICATION   Patient Details  Name: [name] MRN: [id number] Date of Birth: 04-03-1946   Medicare Observation Status Notification Given:  Yes    Marilu Favre, RN 07/03/2016, 11:04 AM

## 2016-07-03 NOTE — Progress Notes (Signed)
PROGRESS NOTE    ANALY BASSFORD  BJS:[id number] DOB: [birth year] DOA: 07/02/2016 PCP: Hoyt Koch, MD    Brief Narrative:  Patient is a 70 year old female history of right breast cancer in remission, COPD, history of right upper lobe lung mass status post wedge resection 1993 presenting with progressive worsening shortness of breath for the past 3-4 days which have been present for at least 5 weeks. Patient with associated lower chest tightness. Patient being treated for acute hypoxic respiratory failure secondary to COPD exacerbation.   Assessment & Plan:   Principal Problem:   Acute respiratory failure with hypoxia (HCC) Active Problems:   COPD exacerbation (HCC)   Malignant neoplasm of female breast (HCC)   Hypothyroidism   Hyperlipidemia   Hearing loss   Essential hypertension   COPD (chronic obstructive pulmonary disease) (HCC)   GERD   Arthropathy   PAD (peripheral artery disease) (HCC)   Tobacco abuse   Chronic mesenteric ischemia (HCC)   CAD (coronary artery disease)   HLD (hyperlipidemia)   Thyroid activity decreased   #1 acute respiratory failure with hypoxia secondary to acute COPD exacerbation Patient still with significant shortness of breath and hypoxic on minimal exertion. Patient with poor air movement. Will discontinue oral prednisone and placed on IV Solu-Medrol. Place on Pulmicort and Brovana. Place on scheduled nebulizers, Mucinex, Claritin, Flonase, PPI. Change doxycycline to IV Levaquin. Flutter valve area follow. Will likely need to follow-up with pulmonary as outpatient.  #2 hypothyroidism Continue Synthroid.  #3 hypertension Stable. Continue home regimen of Lopressor, Norvasc.  #4 hyperlipidemia Continue statin.  #5 gastroesophageal reflux disease  PPI.  #6 peripheral vascular disease/CAD Continue aspirin, Pletal, statin, beta blockers, calcium channel blockers.  #7 tobacco abuse The admitting physician patient recently quit tobacco  use.   #8 history of breast cancer in remission Stable. Outpatient follow-up.      DVT prophylaxis: Lovenox Code Status: Full Family Communication: Updated patient. No family present. Disposition Plan: Home when shortness of breath and COPD exacerbation have improved.    Consultants:   None  Procedures:   Chest x-ray 07/03/2016, 07/02/2016  Antimicrobials:   IV doxycycline 1 dose  IV Levaquin 07/03/2016   Subjective: Patient states still significantly short of breath on minimal exertion from the bed to the bathroom. No chest pain. No shortness of breath.  Objective: Vitals:   07/02/16 2041 07/02/16 2049 07/03/16 0533 07/03/16 0732  BP:  107/78 129/72   Pulse: 96 97 87   Resp: (!) 22 (!) 22    Temp:  97.9 F (36.6 C) 97.9 F (36.6 C)   TempSrc:  Oral Oral   SpO2: 93% 92% 92% 91%  Weight:      Height:        Intake/Output Summary (Last 24 hours) at 07/03/16 1333 Last data filed at 07/03/16 1143  Gross per 24 hour  Intake              240 ml  Output             1700 ml  Net            -1460 ml   Filed Weights   07/02/16 1120  Weight: 63.5 kg (140 lb)    Examination:  General exam: Appears calm. Respiratory system: Poor air movement. Diffuse wheezing. Some use of accessory muscles however speaking in full sentences.  Cardiovascular system: S1 & S2 heard, RRR. No JVD, murmurs, rubs, gallops or clicks. No pedal edema. Gastrointestinal system: Abdomen  is nondistended, soft and nontender. No organomegaly or masses felt. Normal bowel sounds heard. Central nervous system: Alert and oriented. No focal neurological deficits. Extremities: Symmetric 5 x 5 power. Skin: No rashes, lesions or ulcers Psychiatry: Judgement and insight appear normal. Mood & affect appropriate.     Data Reviewed: I have personally reviewed following labs and imaging studies  CBC:  Recent Labs Lab 07/02/16 1235 07/03/16 0256  WBC 7.5 7.0  NEUTROABS 4.7  --   HGB 16.7*  14.5  HCT 50.3* 43.8  MCV 94.2 93.4  PLT 361 703   Basic Metabolic Panel:  Recent Labs Lab 07/02/16 1235 07/03/16 0256  NA 138 139  K 4.3 4.2  CL 102 105  CO2 26 27  GLUCOSE 93 110*  BUN 11 11  CREATININE 0.98 0.79  CALCIUM 9.9 9.5   GFR: Estimated Creatinine Clearance: 60.4 mL/min (by C-G formula based on SCr of 0.79 mg/dL). Liver Function Tests:  Recent Labs Lab 07/02/16 1235 07/03/16 0256  AST 28 20  ALT 26 22  ALKPHOS 64 51  BILITOT 0.7 0.5  PROT 7.9 6.8  ALBUMIN 4.5 3.7   No results for input(s): LIPASE, AMYLASE in the last 168 hours. No results for input(s): AMMONIA in the last 168 hours. Coagulation Profile: No results for input(s): INR, PROTIME in the last 168 hours. Cardiac Enzymes:  Recent Labs Lab 07/02/16 1235  TROPONINI <0.03   BNP (last 3 results) No results for input(s): PROBNP in the last 8760 hours. HbA1C: No results for input(s): HGBA1C in the last 72 hours. CBG: No results for input(s): GLUCAP in the last 168 hours. Lipid Profile: No results for input(s): CHOL, HDL, LDLCALC, TRIG, CHOLHDL, LDLDIRECT in the last 72 hours. Thyroid Function Tests: No results for input(s): TSH, T4TOTAL, FREET4, T3FREE, THYROIDAB in the last 72 hours. Anemia Panel: No results for input(s): VITAMINB12, FOLATE, FERRITIN, TIBC, IRON, RETICCTPCT in the last 72 hours. Sepsis Labs: No results for input(s): PROCALCITON, LATICACIDVEN in the last 168 hours.  No results found for this or any previous visit (from the past 240 hour(s)).       Radiology Studies: X-ray Chest Pa And Lateral  Result Date: 07/03/2016 CLINICAL DATA:  Shortness of breath, cough, congestion, COPD EXAM: CHEST  2 VIEW COMPARISON:  07/02/2016 FINDINGS: Postsurgical changes in the right hemithorax/ right upper lobe. Chronic interstitial markings/emphysematous changes. No focal consolidation. No pleural effusion or pneumothorax. Postsurgical changes overlying the right hemithorax. The heart  is normal in size. Mild degenerative changes of the visualized thoracolumbar spine. IMPRESSION: No evidence of acute cardiopulmonary disease. Electronically Signed   By: Julian Hy M.D.   On: 07/03/2016 10:13   Dg Chest 2 View  Result Date: 07/02/2016 CLINICAL DATA:  Short of breath EXAM: CHEST  2 VIEW COMPARISON:  06/06/2016 FINDINGS: Advanced COPD with hyperinflation and emphysema. Postop resection right upper lobe. Continued improvement in left upper lobe infiltrate which has nearly completely cleared. No underlying mass or adenopathy. No pleural effusion. Heart size and vascularity normal. IMPRESSION: Advanced COPD.  No acute abnormality. Electronically Signed   By: Franchot Gallo M.D.   On: 07/02/2016 12:07        Scheduled Meds: . amitriptyline  50 mg Oral QHS  . amLODipine  5 mg Oral Daily  . aspirin EC  81 mg Oral Daily  . atorvastatin  80 mg Oral Daily  . buPROPion  150 mg Oral BID  . cilostazol  100 mg Oral BID  . doxycycline (VIBRAMYCIN)  IV  100 mg Intravenous Q12H  . enoxaparin (LOVENOX) injection  40 mg Subcutaneous Q24H  . ipratropium-albuterol  3 mL Nebulization Q6H  . levothyroxine  112 mcg Oral QAC breakfast  . metoprolol tartrate  25 mg Oral BID  . mometasone-formoterol  2 puff Inhalation BID  . pantoprazole  40 mg Oral Daily  . predniSONE  40 mg Oral Q breakfast   Continuous Infusions:    LOS: 0 days    Time spent: 54 minutes    Sekou Zuckerman, MD [facility] Pager [phone]  If 7PM-7AM, please contact night-coverage [email or web address] Password [email or web address] 07/03/2016, 1:33 PM

## 2016-07-04 LAB — BASIC METABOLIC PANEL
Anion gap: 8 (ref 5–15)
BUN: 16 mg/dL (ref 6–20)
CO2: 26 mmol/L (ref 22–32)
Calcium: 9.7 mg/dL (ref 8.9–10.3)
Chloride: 103 mmol/L (ref 101–111)
Creatinine, Ser: 0.85 mg/dL (ref 0.44–1.00)
GFR calc Af Amer: >60 mL/min (ref >60)
GFR calc non Af Amer: >60 mL/min (ref >60)
Glucose, Bld: 133 mg/dL — ABNORMAL HIGH (ref 65–99)
Potassium: 4.4 mmol/L (ref 3.5–5.1)
Sodium: 137 mmol/L (ref 135–145)

## 2016-07-04 LAB — CBC
HCT: 45.2 % (ref 36.0–46.0)
Hemoglobin: 14.8 g/dL (ref 12.0–15.0)
MCH: 30.8 pg (ref 26.0–34.0)
MCHC: 32.7 g/dL (ref 30.0–36.0)
MCV: 94.2 fL (ref 78.0–100.0)
Platelets: 381 10*3/uL (ref 150–400)
RBC: 4.8 MIL/uL (ref 3.87–5.11)
RDW: 15.9 % — ABNORMAL HIGH (ref 11.5–15.5)
WBC: 6.6 10*3/uL (ref 4.0–10.5)

## 2016-07-04 MED ORDER — ALUM & MAG HYDROXIDE-SIMETH 200-200-20 MG/5ML PO SUSP
30.0000 mL | Freq: Four times a day (QID) | ORAL | Status: DC | PRN
  Administered 2016-07-05 – 2016-07-07 (×2): 30 mL via ORAL
  Filled 2016-07-04 (×2): qty 30

## 2016-07-04 NOTE — Progress Notes (Signed)
PROGRESS NOTE    [name]  YDX:[id number] DOB: 09-20-1946 DOA: 07/02/2016 PCP: Hoyt Koch, MD    Brief Narrative:  Patient is a 70 year old female history of right breast cancer in remission, COPD, history of right upper lobe lung mass status post wedge resection 1993 presenting with progressive worsening shortness of breath for the past 3-4 days which have been present for at least 5 weeks. Patient with associated lower chest tightness. Patient being treated for acute hypoxic respiratory failure secondary to COPD exacerbation.   Assessment & Plan:   Principal Problem:   Acute respiratory failure with hypoxia (HCC) Active Problems:   COPD exacerbation (HCC)   Malignant neoplasm of female breast (HCC)   Hypothyroidism   Hyperlipidemia   Hearing loss   Essential hypertension   COPD (chronic obstructive pulmonary disease) (HCC)   GERD   Arthropathy   PAD (peripheral artery disease) (HCC)   Tobacco abuse   Chronic mesenteric ischemia (HCC)   CAD (coronary artery disease)   HLD (hyperlipidemia)   Thyroid activity decreased   #1 acute respiratory failure with hypoxia secondary to acute COPD exacerbation Patient still with significant shortness of breath and hypoxic on minimal exertion, however improved with nebulizer treatments and IV steroids. Patient with poor-fair air movement. Continue IV Solu-Medrol, Pulmicort, Brovana, scheduled nebulizers, Mucinex, Claritin, Flonase, PPI, IV Levaquin. Flutter valve. Chest PT. Will likely need to follow-up with pulmonary as outpatient. Will likely need home O2.  #2 hypothyroidism Continue Synthroid.  #3 hypertension Stable. Continue home regimen of Lopressor, Norvasc.  #4 hyperlipidemia Continue statin.  #5 gastroesophageal reflux disease  PPI.  #6 peripheral vascular disease/CAD Continue aspirin, Pletal, statin, beta blockers, calcium channel blockers.  #7 tobacco abuse The admitting physician patient recently quit  tobacco use.   #8 history of breast cancer in remission Stable. Outpatient follow-up.      DVT prophylaxis: Lovenox Code Status: Full Family Communication: Updated patient. No family present. Disposition Plan: Home when shortness of breath and COPD exacerbation have improved.    Consultants:   None  Procedures:   Chest x-ray 07/03/2016, 07/02/2016  Antimicrobials:   IV doxycycline 1 dose  IV Levaquin 07/03/2016   Subjective: Patient states short of breath on minimal exertion with some improvement. No chest pain. No shortness of breath.  Objective: Vitals:   07/04/16 1539 07/04/16 1943 07/04/16 1944 07/04/16 1945  BP:      Pulse:   91   Resp:   18   Temp:      TempSrc:      SpO2: 90% 92% 92% 92%  Weight:      Height:        Intake/Output Summary (Last 24 hours) at 07/04/16 2008 Last data filed at 07/04/16 1907  Gross per 24 hour  Intake              390 ml  Output             2900 ml  Net            -2510 ml   Filed Weights   07/02/16 1120  Weight: 63.5 kg (140 lb)    Examination:  General exam: Appears calm. Respiratory system: Poor-fair air movement. Diffuse wheezing. Less use of accessory muscles however speaking in full sentences.  Cardiovascular system: S1 & S2 heard, RRR. No JVD, murmurs, rubs, gallops or clicks. No pedal edema. Gastrointestinal system: Abdomen is nondistended, soft and nontender. No organomegaly or masses felt. Normal bowel sounds heard.  Central nervous system: Alert and oriented. No focal neurological deficits. Extremities: Symmetric 5 x 5 power. Skin: No rashes, lesions or ulcers Psychiatry: Judgement and insight appear normal. Mood & affect appropriate.     Data Reviewed: I have personally reviewed following labs and imaging studies  CBC:  Recent Labs Lab 07/02/16 1235 07/03/16 0256 07/04/16 0526  WBC 7.5 7.0 6.6  NEUTROABS 4.7  --   --   HGB 16.7* 14.5 14.8  HCT 50.3* 43.8 45.2  MCV 94.2 93.4 94.2  PLT  361 354 027   Basic Metabolic Panel:  Recent Labs Lab 07/02/16 1235 07/03/16 0256 07/04/16 0526  NA 138 139 137  K 4.3 4.2 4.4  CL 102 105 103  CO2 '26 27 26  '$ GLUCOSE 93 110* 133*  BUN '11 11 16  '$ CREATININE 0.98 0.79 0.85  CALCIUM 9.9 9.5 9.7   GFR: Estimated Creatinine Clearance: 56.8 mL/min (by C-G formula based on SCr of 0.85 mg/dL). Liver Function Tests:  Recent Labs Lab 07/02/16 1235 07/03/16 0256  AST 28 20  ALT 26 22  ALKPHOS 64 51  BILITOT 0.7 0.5  PROT 7.9 6.8  ALBUMIN 4.5 3.7   No results for input(s): LIPASE, AMYLASE in the last 168 hours. No results for input(s): AMMONIA in the last 168 hours. Coagulation Profile: No results for input(s): INR, PROTIME in the last 168 hours. Cardiac Enzymes:  Recent Labs Lab 07/02/16 1235  TROPONINI <0.03   BNP (last 3 results) No results for input(s): PROBNP in the last 8760 hours. HbA1C: No results for input(s): HGBA1C in the last 72 hours. CBG: No results for input(s): GLUCAP in the last 168 hours. Lipid Profile: No results for input(s): CHOL, HDL, LDLCALC, TRIG, CHOLHDL, LDLDIRECT in the last 72 hours. Thyroid Function Tests: No results for input(s): TSH, T4TOTAL, FREET4, T3FREE, THYROIDAB in the last 72 hours. Anemia Panel: No results for input(s): VITAMINB12, FOLATE, FERRITIN, TIBC, IRON, RETICCTPCT in the last 72 hours. Sepsis Labs: No results for input(s): PROCALCITON, LATICACIDVEN in the last 168 hours.  No results found for this or any previous visit (from the past 240 hour(s)).       Radiology Studies: X-ray Chest Pa And Lateral  Result Date: 07/03/2016 CLINICAL DATA:  Shortness of breath, cough, congestion, COPD EXAM: CHEST  2 VIEW COMPARISON:  07/02/2016 FINDINGS: Postsurgical changes in the right hemithorax/ right upper lobe. Chronic interstitial markings/emphysematous changes. No focal consolidation. No pleural effusion or pneumothorax. Postsurgical changes overlying the right hemithorax. The  heart is normal in size. Mild degenerative changes of the visualized thoracolumbar spine. IMPRESSION: No evidence of acute cardiopulmonary disease. Electronically Signed   By: Julian Hy M.D.   On: 07/03/2016 10:13        Scheduled Meds: . amitriptyline  50 mg Oral QHS  . amLODipine  5 mg Oral Daily  . arformoterol  15 mcg Nebulization BID  . aspirin EC  81 mg Oral Daily  . atorvastatin  80 mg Oral Daily  . budesonide (PULMICORT) nebulizer solution  0.25 mg Nebulization BID  . buPROPion  150 mg Oral BID  . cilostazol  100 mg Oral BID  . enoxaparin (LOVENOX) injection  40 mg Subcutaneous Q24H  . fluticasone  2 spray Each Nare Daily  . guaiFENesin  1,200 mg Oral BID  . ipratropium-albuterol  3 mL Nebulization Q6H  . levofloxacin (LEVAQUIN) IV  750 mg Intravenous Q24H  . levothyroxine  112 mcg Oral QAC breakfast  . loratadine  10 mg Oral  Daily  . methylPREDNISolone (SOLU-MEDROL) injection  80 mg Intravenous Q6H  . metoprolol tartrate  25 mg Oral BID  . pantoprazole  40 mg Oral Daily   Continuous Infusions:    LOS: 1 day    Time spent: 76 minutes    ,, MD [facility] Pager [phone]  If 7PM-7AM, please contact night-coverage [email or web address] Password [email or web address] 07/04/2016, 8:08 PM

## 2016-07-04 NOTE — Progress Notes (Signed)
Occupational Therapy Treatment Patient Details Name: [name] MRN: [id number] DOB: [birth year] Today's Date: 07/04/2016    History of present illness Pt admitted with COPD exacerbation. PMH: breast cancer, fibromyalgia, PVD, HTN, GERD, hypothyroidism.   OT comments  Focus of session on educating and practicing breathing techniques, pacing and conserving energy. Pt requesting to walk in halls. Prior to ambulation, pt 02 at 84% on 3L, increased to 93% with breathing technique. Pt desaturated to 79% with ambulation, bumped up to 4L and sat for rest break. Pt requiring +5 minutes of seated rest break with pursed lip breathing to recover to 90%. More coughing noted today.  Follow Up Recommendations  No OT follow up    Equipment Recommendations  None recommended by OT    Recommendations for Other Services      Precautions / Restrictions Precautions Precaution Comments: watch sats       Mobility Bed Mobility               General bed mobility comments: in chair  Transfers Overall transfer level: Independent Equipment used: None                  Balance                                   ADL                                         General ADL Comments: Pt requesting to ambulate in halls. Pt pushed dynamap as OT managed 02 tank. Pt instructed in pacing, intermittent seated and standing rest breaks and purse lip breathing as she ambulated and performed toileting and grooming at sink.       Vision                     Perception     Praxis      Cognition   Behavior During Therapy: WFL for tasks assessed/performed Overall Cognitive Status: Within Functional Limits for tasks assessed                       Extremity/Trunk Assessment               Exercises     Shoulder Instructions       General Comments      Pertinent Vitals/ Pain       Pain Assessment: Faces Faces Pain Scale: Hurts a little  bit Pain Location: chest with coughing Pain Descriptors / Indicators: Sore Pain Intervention(s): Monitored during session  Home Living                                          Prior Functioning/Environment              Frequency  Min 2X/week        Progress Toward Goals  OT Goals(current goals can now be found in the care plan section)  Progress towards OT goals: Progressing toward goals  Acute Rehab OT Goals Patient Stated Goal: breathe better Time For Goal Achievement: 07/10/16 Potential to Achieve Goals: Good  Plan Discharge plan remains appropriate    Co-evaluation  End of Session Equipment Utilized During Treatment: Oxygen   Activity Tolerance Patient limited by fatigue   Patient Left in chair;with call bell/phone within reach;with nursing/sitter in room   Nurse Communication  (aware of desaturations)        Time: 7741-4239 OT Time Calculation (min): 26 min  Charges: OT General Charges $OT Visit: 1 Procedure OT Treatments $Self Care/Home Management : 23-37 mins  Malka So 07/04/2016, 11:19 AM  [phone]

## 2016-07-04 NOTE — Progress Notes (Signed)
Pt requested prn nebulizer for wheezing, med administered as ordered

## 2016-07-05 LAB — CBC
HCT: 43.7 % (ref 36.0–46.0)
Hemoglobin: 14.4 g/dL (ref 12.0–15.0)
MCH: 30.9 pg (ref 26.0–34.0)
MCHC: 33 g/dL (ref 30.0–36.0)
MCV: 93.8 fL (ref 78.0–100.0)
Platelets: 401 10*3/uL — ABNORMAL HIGH (ref 150–400)
RBC: 4.66 MIL/uL (ref 3.87–5.11)
RDW: 16 % — ABNORMAL HIGH (ref 11.5–15.5)
WBC: 9.7 10*3/uL (ref 4.0–10.5)

## 2016-07-05 LAB — BASIC METABOLIC PANEL
Anion gap: 8 (ref 5–15)
BUN: 20 mg/dL (ref 6–20)
CO2: 26 mmol/L (ref 22–32)
Calcium: 9.8 mg/dL (ref 8.9–10.3)
Chloride: 105 mmol/L (ref 101–111)
Creatinine, Ser: 0.84 mg/dL (ref 0.44–1.00)
GFR calc Af Amer: >60 mL/min (ref >60)
GFR calc non Af Amer: >60 mL/min (ref >60)
Glucose, Bld: 124 mg/dL — ABNORMAL HIGH (ref 65–99)
Potassium: 4.2 mmol/L (ref 3.5–5.1)
Sodium: 139 mmol/L (ref 135–145)

## 2016-07-05 MED ORDER — LEVOFLOXACIN 750 MG PO TABS
750.0000 mg | ORAL_TABLET | Freq: Every day | ORAL | Status: DC
  Administered 2016-07-06 – 2016-07-08 (×3): 750 mg via ORAL
  Filled 2016-07-05 (×3): qty 1

## 2016-07-05 MED ORDER — IPRATROPIUM-ALBUTEROL 0.5-2.5 (3) MG/3ML IN SOLN
3.0000 mL | Freq: Four times a day (QID) | RESPIRATORY_TRACT | Status: DC
  Administered 2016-07-05 – 2016-07-06 (×4): 3 mL via RESPIRATORY_TRACT
  Filled 2016-07-05 (×4): qty 3

## 2016-07-05 MED ORDER — METHYLPREDNISOLONE SODIUM SUCC 125 MG IJ SOLR
80.0000 mg | Freq: Three times a day (TID) | INTRAMUSCULAR | Status: DC
  Administered 2016-07-05: 62.5 mg via INTRAVENOUS
  Administered 2016-07-05 – 2016-07-08 (×8): 80 mg via INTRAVENOUS
  Filled 2016-07-05 (×9): qty 2

## 2016-07-05 NOTE — Evaluation (Signed)
Physical Therapy Evaluation Patient Details Name: [name] MRN: [id number] DOB: 12/03/1945 Today's Date: 07/05/2016   History of Present Illness  Pt admitted with COPD exacerbation. PMH: breast cancer, fibromyalgia, PVD, HTN, GERD, hypothyroidism.  Clinical Impression  Pt admitted with above diagnosis. Pt currently with functional limitations due to the deficits listed below (see PT Problem List).  Pt will benefit from skilled PT to increase their independence and safety with mobility to allow discharge to the venue listed below. Pt de-sat on RA to 84% and 93% on 4 L/min with gait.  Pt has flight of stairs and will need to practice stairs.  At this time, limited by de-sat and dyspnea, but otherwise close to baseline in terms of her mobility.      Follow Up Recommendations No PT follow up    Equipment Recommendations  None recommended by PT    Recommendations for Other Services       Precautions / Restrictions Precautions Precaution Comments: watch sats      Mobility  Bed Mobility               General bed mobility comments: in chair upon arrival  Transfers Overall transfer level: Independent Equipment used: None                Ambulation/Gait Ambulation/Gait assistance: Modified independent (Device/Increase time) Ambulation Distance (Feet): 280 Feet Assistive device:  (pushing dynamap) Gait Pattern/deviations: Step-through pattern     General Gait Details: on 4 L/min o2 93% and on RA 84%.  with 3/4 dyspnea  Stairs            Wheelchair Mobility    Modified Rankin (Stroke Patients Only)       Balance Overall balance assessment: No apparent balance deficits (not formally assessed)                                           Pertinent Vitals/Pain Pain Assessment: Faces Faces Pain Scale: Hurts little more Pain Location: chest with coughing Pain Descriptors / Indicators: Sore Pain Intervention(s): Monitored during  session 93% on 4 L.min with gait    Home Living Family/patient expects to be discharged to:: Private residence Living Arrangements: Spouse/significant other Available Help at Discharge: Family;Available 24 hours/day Type of Home: House       Home Layout: Two level;1/2 bath on main level Home Equipment: None      Prior Function Level of Independence: Independent         Comments: drives     Hand Dominance   Dominant Hand: Right    Extremity/Trunk Assessment   Upper Extremity Assessment: Overall WFL for tasks assessed           Lower Extremity Assessment: Overall WFL for tasks assessed      Cervical / Trunk Assessment: Normal  Communication   Communication: HOH (wears hearing aids)  Cognition Arousal/Alertness: Awake/alert Behavior During Therapy: WFL for tasks assessed/performed Overall Cognitive Status: Within Functional Limits for tasks assessed                      General Comments General comments (skin integrity, edema, etc.): Pt with congested cough, but unable to cough up anything    Exercises     Assessment/Plan    PT Assessment Patient needs continued PT services  PT Problem List Cardiopulmonary status limiting activity  PT Treatment Interventions Gait training;Stair training;Functional mobility training;Patient/family education    PT Goals (Current goals can be found in the Care Plan section)  Acute Rehab PT Goals Patient Stated Goal: breathe better PT Goal Formulation: With patient Time For Goal Achievement: 07/10/16 Potential to Achieve Goals: Good    Frequency Min 2X/week   Barriers to discharge Inaccessible home environment flight of stairs to bedroom    Co-evaluation               End of Session Equipment Utilized During Treatment: Oxygen Activity Tolerance: Treatment limited secondary to medical complications (Comment) (de-sat on RA) Patient left: in chair;with call bell/phone within reach Nurse  Communication: Mobility status         Time: 2119-4174 PT Time Calculation (min) (ACUTE ONLY): 18 min   Charges:   PT Evaluation $PT Eval Low Complexity: 1 Procedure     PT G Codes:        , LUBECK 07/05/2016, 12:51 PM

## 2016-07-05 NOTE — Progress Notes (Signed)
PROGRESS NOTE    [name]  HBZ:[id number] DOB: 1946-07-03 DOA: 07/02/2016 PCP: Hoyt Koch, MD    Brief Narrative:  Patient is a 70 year old female history of right breast cancer in remission, COPD, history of right upper lobe lung mass status post wedge resection 1993 presenting with progressive worsening shortness of breath for the past 3-4 days which have been present for at least 5 weeks. Patient with associated lower chest tightness. Patient being treated for acute hypoxic respiratory failure secondary to COPD exacerbation.   Assessment & Plan:   Principal Problem:   Acute respiratory failure with hypoxia (HCC) Active Problems:   COPD exacerbation (HCC)   Malignant neoplasm of female breast (HCC)   Hypothyroidism   Hyperlipidemia   Hearing loss   Essential hypertension   COPD (chronic obstructive pulmonary disease) (HCC)   GERD   Arthropathy   PAD (peripheral artery disease) (HCC)   Tobacco abuse   Chronic mesenteric ischemia (HCC)   CAD (coronary artery disease)   HLD (hyperlipidemia)   Thyroid activity decreased   #1 acute respiratory failure with hypoxia secondary to acute COPD exacerbation Patient still with significant shortness of breath and hypoxic on minimal exertion, however improved with nebulizer treatments and IV steroids. Patient with poor-fair air movement. Continue IV Solu-Medrol taper, Pulmicort, Brovana, scheduled nebulizers, Mucinex, Claritin, Flonase, PPI, IV Levaquin. Flutter valve. Chest PT. Will likely need to follow-up with pulmonary as outpatient. Will likely need home O2.  #2 hypothyroidism Continue Synthroid.  #3 hypertension Stable. Continue home regimen of Lopressor, Norvasc.  #4 hyperlipidemia Continue statin.  #5 gastroesophageal reflux disease  PPI.  #6 peripheral vascular disease/CAD Continue aspirin, Pletal, statin, beta blockers, calcium channel blockers.  #7 tobacco abuse The admitting physician patient recently  quit tobacco use.   #8 history of breast cancer in remission Stable. Outpatient follow-up.      DVT prophylaxis: Lovenox Code Status: Full Family Communication: Updated patient. No family present. Disposition Plan: Home when shortness of breath and COPD exacerbation have improved.    Consultants:   None  Procedures:   Chest x-ray 07/03/2016, 07/02/2016  Antimicrobials:   IV doxycycline 1 dose  IV Levaquin 07/03/2016>>> oral Levaquin 07/05/2016   Subjective: Patient states short of breath on minimal exertion is improving. No chest pain.   Objective: Vitals:   07/05/16 1030 07/05/16 1034 07/05/16 1037 07/05/16 1039  BP:      Pulse:      Resp:      Temp:      TempSrc:      SpO2: 90% (!) 73% (!) 78% 94%  Weight:      Height:        Intake/Output Summary (Last 24 hours) at 07/05/16 1410 Last data filed at 07/05/16 1025  Gross per 24 hour  Intake              480 ml  Output             2600 ml  Net            -2120 ml   Filed Weights   07/02/16 1120  Weight: 63.5 kg (140 lb)    Examination:  General exam: Appears calm. Respiratory system: Poor-fair air movement. Diffuse wheezing. Less use of accessory muscles however speaking in full sentences.  Cardiovascular system: S1 & S2 heard, RRR. No JVD, murmurs, rubs, gallops or clicks. No pedal edema. Gastrointestinal system: Abdomen is nondistended, soft and nontender. No organomegaly or masses felt. Normal bowel  sounds heard. Central nervous system: Alert and oriented. No focal neurological deficits. Extremities: Symmetric 5 x 5 power. Skin: No rashes, lesions or ulcers Psychiatry: Judgement and insight appear normal. Mood & affect appropriate.     Data Reviewed: I have personally reviewed following labs and imaging studies  CBC:  Recent Labs Lab 07/02/16 1235 07/03/16 0256 07/04/16 0526 07/05/16 0323  WBC 7.5 7.0 6.6 9.7  NEUTROABS 4.7  --   --   --   HGB 16.7* 14.5 14.8 14.4  HCT 50.3* 43.8  45.2 43.7  MCV 94.2 93.4 94.2 93.8  PLT 361 354 381 270*   Basic Metabolic Panel:  Recent Labs Lab 07/02/16 1235 07/03/16 0256 07/04/16 0526 07/05/16 0323  NA 138 139 137 139  K 4.3 4.2 4.4 4.2  CL 102 105 103 105  CO2 '26 27 26 26  '$ GLUCOSE 93 110* 133* 124*  BUN '11 11 16 20  '$ CREATININE 0.98 0.79 0.85 0.84  CALCIUM 9.9 9.5 9.7 9.8   GFR: Estimated Creatinine Clearance: 57.5 mL/min (by C-G formula based on SCr of 0.84 mg/dL). Liver Function Tests:  Recent Labs Lab 07/02/16 1235 07/03/16 0256  AST 28 20  ALT 26 22  ALKPHOS 64 51  BILITOT 0.7 0.5  PROT 7.9 6.8  ALBUMIN 4.5 3.7   No results for input(s): LIPASE, AMYLASE in the last 168 hours. No results for input(s): AMMONIA in the last 168 hours. Coagulation Profile: No results for input(s): INR, PROTIME in the last 168 hours. Cardiac Enzymes:  Recent Labs Lab 07/02/16 1235  TROPONINI <0.03   BNP (last 3 results) No results for input(s): PROBNP in the last 8760 hours. HbA1C: No results for input(s): HGBA1C in the last 72 hours. CBG: No results for input(s): GLUCAP in the last 168 hours. Lipid Profile: No results for input(s): CHOL, HDL, LDLCALC, TRIG, CHOLHDL, LDLDIRECT in the last 72 hours. Thyroid Function Tests: No results for input(s): TSH, T4TOTAL, FREET4, T3FREE, THYROIDAB in the last 72 hours. Anemia Panel: No results for input(s): VITAMINB12, FOLATE, FERRITIN, TIBC, IRON, RETICCTPCT in the last 72 hours. Sepsis Labs: No results for input(s): PROCALCITON, LATICACIDVEN in the last 168 hours.  No results found for this or any previous visit (from the past 240 hour(s)).       Radiology Studies: No results found.      Scheduled Meds: . amitriptyline  50 mg Oral QHS  . amLODipine  5 mg Oral Daily  . arformoterol  15 mcg Nebulization BID  . aspirin EC  81 mg Oral Daily  . atorvastatin  80 mg Oral Daily  . budesonide (PULMICORT) nebulizer solution  0.25 mg Nebulization BID  . buPROPion   150 mg Oral BID  . cilostazol  100 mg Oral BID  . enoxaparin (LOVENOX) injection  40 mg Subcutaneous Q24H  . fluticasone  2 spray Each Nare Daily  . guaiFENesin  1,200 mg Oral BID  . ipratropium-albuterol  3 mL Nebulization Q6H  . levofloxacin (LEVAQUIN) IV  750 mg Intravenous Q24H  . levothyroxine  112 mcg Oral QAC breakfast  . loratadine  10 mg Oral Daily  . methylPREDNISolone (SOLU-MEDROL) injection  80 mg Intravenous Q8H  . metoprolol tartrate  25 mg Oral BID  . pantoprazole  40 mg Oral Daily   Continuous Infusions:    LOS: 2 days    Time spent: 74 minutes    THOMPSON,DANIEL, MD [facility] Pager [phone]  If 7PM-7AM, please contact night-coverage [email or web address] Password [email or web address] 07/05/2016, 2:10 PM

## 2016-07-05 NOTE — Progress Notes (Signed)
SATURATION QUALIFICATIONS: (This note is used to comply with regulatory documentation for home oxygen)  Patient Saturations on Room Air at Rest = 91%  Patient Saturations on Room Air while Ambulating = 73%  Patient Saturations on 4 Liters of oxygen while Ambulating = 89%  Please briefly explain why patient needs home oxygen: de sats when ambulating,

## 2016-07-05 NOTE — Care Management Note (Signed)
Case Management Note  Patient Details  Name: [name] MRN: [id number] Date of Birth: [birth year]  Subjective/Objective:                    Action/Plan: Will need oxygen saturation note within 24 of discharge to arrange home oxygen. Patient currently on IV steroids Q 8 hours.  Expected Discharge Date:                  Expected Discharge Plan:  Home/Self Care  In-House Referral:     Discharge planning Services     Post Acute Care Choice:    Choice offered to:  Patient, Spouse  DME Arranged:  Oxygen DME Agency:  [place]:    [place] Agency:     Status of Service:  In process, will continue to follow  If discussed at Long Length of Stay Meetings, dates discussed:    Additional Comments:  Marilu Favre, RN 07/05/2016, 10:40 AM

## 2016-07-05 NOTE — Progress Notes (Signed)
SATURATION QUALIFICATIONS: (This note is used to comply with regulatory documentation for home oxygen)  Patient Saturations on Room Air at Rest = 94%  Patient Saturations on Room Air while Ambulating = 84%  Patient Saturations on 4 Liters of oxygen while Ambulating = 93%  Please briefly explain why patient needs home oxygen:De-sat on room air with ambulation

## 2016-07-05 NOTE — Progress Notes (Signed)
PT Cancellation Note  Patient Details Name: [name] MRN: [id number] DOB: 1946-02-20   Cancelled Treatment:    Reason Eval/Treat Not Completed: Patient sleeping very soundly and not waking up to name.  Will allow patient to sleep and check back later today.   Blessed Girdner LUBECK 07/05/2016, 9:50 AM

## 2016-07-06 DIAGNOSIS — J432 Centrilobular emphysema: Secondary | ICD-10-CM

## 2016-07-06 DIAGNOSIS — J9601 Acute respiratory failure with hypoxia: Secondary | ICD-10-CM

## 2016-07-06 DIAGNOSIS — J441 Chronic obstructive pulmonary disease with (acute) exacerbation: Principal | ICD-10-CM

## 2016-07-06 MED ORDER — TIOTROPIUM BROMIDE MONOHYDRATE 18 MCG IN CAPS
18.0000 ug | ORAL_CAPSULE | Freq: Every day | RESPIRATORY_TRACT | Status: DC
  Administered 2016-07-07: 18 ug via RESPIRATORY_TRACT
  Filled 2016-07-06: qty 5

## 2016-07-06 MED ORDER — BUDESONIDE 0.5 MG/2ML IN SUSP
0.5000 mg | Freq: Two times a day (BID) | RESPIRATORY_TRACT | Status: DC
  Administered 2016-07-06 – 2016-07-10 (×8): 0.5 mg via RESPIRATORY_TRACT
  Filled 2016-07-06 (×8): qty 2

## 2016-07-06 MED ORDER — ALBUTEROL SULFATE (2.5 MG/3ML) 0.083% IN NEBU
2.5000 mg | INHALATION_SOLUTION | Freq: Four times a day (QID) | RESPIRATORY_TRACT | Status: DC
  Administered 2016-07-06 – 2016-07-07 (×3): 2.5 mg via RESPIRATORY_TRACT
  Filled 2016-07-06 (×3): qty 3

## 2016-07-06 NOTE — Care Management Important Message (Signed)
Important Message  Patient Details  Name: [name] MRN: [id number] Date of Birth: 04-08-1946   Medicare Important Message Given:  Yes    [name] [address] 07/06/2016, 1:51 PM

## 2016-07-06 NOTE — Progress Notes (Signed)
PROGRESS NOTE    [name]  ONG:[id number] DOB: 26-Jul-1946 DOA: 07/02/2016 PCP: Hoyt Koch, MD    Brief Narrative:  Patient is a 70 year old female history of right breast cancer in remission, COPD, history of right upper lobe lung mass status post wedge resection 1993 presenting with progressive worsening shortness of breath for the past 3-4 days which have been present for at least 5 weeks. Patient with associated lower chest tightness. Patient being treated for acute hypoxic respiratory failure secondary to COPD exacerbation.   Assessment & Plan:   Principal Problem:   Acute respiratory failure with hypoxia (HCC) Active Problems:   COPD exacerbation (HCC)   Malignant neoplasm of female breast (HCC)   Hypothyroidism   Hyperlipidemia   Hearing loss   Essential hypertension   COPD (chronic obstructive pulmonary disease) (HCC)   GERD   Arthropathy   PAD (peripheral artery disease) (HCC)   Tobacco abuse   Chronic mesenteric ischemia (HCC)   CAD (coronary artery disease)   HLD (hyperlipidemia)   Thyroid activity decreased   #1 acute respiratory failure with hypoxia secondary to acute COPD exacerbation Patient still with significant shortness of breath and hypoxic on minimal exertion, however improved with nebulizer treatments and IV steroids. Patient with poor-fair air movement with no significant improvement over the past 1-2 days. Continue IV Solu-Medrol taper, Pulmicort, Brovana, scheduled nebulizers, Mucinex, Claritin, Flonase, PPI, IV Levaquin. Flutter valve. Chest PT. Will likely need to follow-up with pulmonary as outpatient. Will likely need home O2. Due to patient's poor progression will consult with pulmonary for further evaluation and management.  #2 hypothyroidism Continue Synthroid.  #3 hypertension Stable. Continue home regimen of Lopressor, Norvasc.  #4 hyperlipidemia Continue statin.  #5 gastroesophageal reflux disease  PPI.  #6 peripheral  vascular disease/CAD Continue aspirin, Pletal, statin, beta blockers, calcium channel blockers.  #7 tobacco abuse Per admitting physician patient recently quit tobacco use.   #8 history of breast cancer in remission Stable. Outpatient follow-up.      DVT prophylaxis: Lovenox Code Status: Full Family Communication: Updated patient. No family present. Disposition Plan: Home when shortness of breath and COPD exacerbation have improved.    Consultants:   None  Procedures:   Chest x-ray 07/03/2016, 07/02/2016  Antimicrobials:   IV doxycycline 1 dose  IV Levaquin 07/03/2016>>> oral Levaquin 07/05/2016   Subjective: Patient states short of breath on minimal exertion with no significant improvement. No chest pain.   Objective: Vitals:   07/06/16 0437 07/06/16 0759 07/06/16 1300 07/06/16 1627  BP: 113/76  122/83   Pulse: 67  82   Resp: 18  18   Temp: 98 F (36.7 C)  98 F (36.7 C)   TempSrc: Oral  Oral   SpO2: 98% 97% 93% 94%  Weight:      Height:        Intake/Output Summary (Last 24 hours) at 07/06/16 1926 Last data filed at 07/06/16 1844  Gross per 24 hour  Intake             1080 ml  Output             2725 ml  Net            -1645 ml   Filed Weights   07/02/16 1120  Weight: 63.5 kg (140 lb)    Examination:  General exam: Appears calm. Respiratory system: Poor-fair air movement. Diffuse wheezing. Less use of accessory muscles however speaking in full sentences.  Cardiovascular system: S1 &  S2 heard, RRR. No JVD, murmurs, rubs, gallops or clicks. No pedal edema. Gastrointestinal system: Abdomen is nondistended, soft and nontender. No organomegaly or masses felt. Normal bowel sounds heard. Central nervous system: Alert and oriented. No focal neurological deficits. Extremities: Symmetric 5 x 5 power. Skin: No rashes, lesions or ulcers Psychiatry: Judgement and insight appear normal. Mood & affect appropriate.     Data Reviewed: I have personally  reviewed following labs and imaging studies  CBC:  Recent Labs Lab 07/02/16 1235 07/03/16 0256 07/04/16 0526 07/05/16 0323  WBC 7.5 7.0 6.6 9.7  NEUTROABS 4.7  --   --   --   HGB 16.7* 14.5 14.8 14.4  HCT 50.3* 43.8 45.2 43.7  MCV 94.2 93.4 94.2 93.8  PLT 361 354 381 211*   Basic Metabolic Panel:  Recent Labs Lab 07/02/16 1235 07/03/16 0256 07/04/16 0526 07/05/16 0323  NA 138 139 137 139  K 4.3 4.2 4.4 4.2  CL 102 105 103 105  CO2 '26 27 26 26  '$ GLUCOSE 93 110* 133* 124*  BUN '11 11 16 20  '$ CREATININE 0.98 0.79 0.85 0.84  CALCIUM 9.9 9.5 9.7 9.8   GFR: Estimated Creatinine Clearance: 57.5 mL/min (by C-G formula based on SCr of 0.84 mg/dL). Liver Function Tests:  Recent Labs Lab 07/02/16 1235 07/03/16 0256  AST 28 20  ALT 26 22  ALKPHOS 64 51  BILITOT 0.7 0.5  PROT 7.9 6.8  ALBUMIN 4.5 3.7   No results for input(s): LIPASE, AMYLASE in the last 168 hours. No results for input(s): AMMONIA in the last 168 hours. Coagulation Profile: No results for input(s): INR, PROTIME in the last 168 hours. Cardiac Enzymes:  Recent Labs Lab 07/02/16 1235  TROPONINI <0.03   BNP (last 3 results) No results for input(s): PROBNP in the last 8760 hours. HbA1C: No results for input(s): HGBA1C in the last 72 hours. CBG: No results for input(s): GLUCAP in the last 168 hours. Lipid Profile: No results for input(s): CHOL, HDL, LDLCALC, TRIG, CHOLHDL, LDLDIRECT in the last 72 hours. Thyroid Function Tests: No results for input(s): TSH, T4TOTAL, FREET4, T3FREE, THYROIDAB in the last 72 hours. Anemia Panel: No results for input(s): VITAMINB12, FOLATE, FERRITIN, TIBC, IRON, RETICCTPCT in the last 72 hours. Sepsis Labs: No results for input(s): PROCALCITON, LATICACIDVEN in the last 168 hours.  No results found for this or any previous visit (from the past 240 hour(s)).       Radiology Studies: No results found.      Scheduled Meds: . albuterol  2.5 mg Nebulization  Q6H WA  . amitriptyline  50 mg Oral QHS  . amLODipine  5 mg Oral Daily  . aspirin EC  81 mg Oral Daily  . atorvastatin  80 mg Oral Daily  . budesonide (PULMICORT) nebulizer solution  0.5 mg Nebulization BID  . buPROPion  150 mg Oral BID  . cilostazol  100 mg Oral BID  . enoxaparin (LOVENOX) injection  40 mg Subcutaneous Q24H  . fluticasone  2 spray Each Nare Daily  . guaiFENesin  1,200 mg Oral BID  . levofloxacin  750 mg Oral Daily  . levothyroxine  112 mcg Oral QAC breakfast  . loratadine  10 mg Oral Daily  . methylPREDNISolone (SOLU-MEDROL) injection  80 mg Intravenous Q8H  . metoprolol tartrate  25 mg Oral BID  . pantoprazole  40 mg Oral Daily  . tiotropium  18 mcg Inhalation Daily   Continuous Infusions:    LOS: 3 days  Time spent: 33 minutes    Bernisha Verma, MD [facility] Pager [phone]  If 7PM-7AM, please contact night-coverage [email or web address] Password [facility] 07/06/2016, 7:26 PM

## 2016-07-06 NOTE — Consult Note (Signed)
PULMONARY / CRITICAL CARE MEDICINE   Name: [name] MRN: [id number] DOB: 30-Dec-1945    ADMISSION DATE:  07/02/2016 CONSULTATION DATE:  07/06/2016  REFERRING MD:  Dr. Grandville Silos, [facility]  CHIEF COMPLAINT:  Short of breath  HISTORY OF PRESENT ILLNESS:   70 yo female smoker presented with dyspnea, cough, wheezing, hypoxia with SpO2 in 80s at home.  Chest xray was negative for infiltrate or effusion.  She had mildly elevated eosinophil count on CBD with differential.  She was admitted with COPD exacerbation.  She was started on solumedrol, levaquin, and BDs.  She continued to have difficulty with her breathing and PCCM consulted.  She was treated for pneumonia in July.  She was given antibiotics and prednisone.  She felt well for few weeks, but then started having trouble breathing again.  She has not smoked cigarettes for past few months.  She denies sick exposures.  She has noticed sinus congestion and sore throat.  She denies fever, skin rash, leg swelling, abdominal pain, or diarrhea.  PAST MEDICAL HISTORY :  She  has a past medical history of Cancer ([place]) (2010); Complication of anesthesia; COPD (chronic obstructive pulmonary disease) ([place]); Diverticulitis; Emphysema of lung ([place]); Fibromyalgia (1995); GERD (gastroesophageal reflux disease); Hearing loss of both ears; colonic polyps; Hyperlipidemia; Hypertension; Hypothyroidism; Osteoporosis; Peripheral vascular disease [facility]) (May '12 - CT angio); Shortness of breath dyspnea; Varicose veins of leg with swelling; and Wears glasses.  PAST SURGICAL HISTORY: She  has a past surgical history that includes Appendectomy; Wedge resection (1993); Colonoscopy w/ polypectomy; Abdominal hysterectomy (1973); Colectomy (2002); Cardiac catheterization ('11); Breast surgery (2010); Breast surgery (2010); Breast surgery (2010); Lung surgery (9/12); and Partial mastectomy with needle localization (Left, 02/23/2013).  Allergies  Allergen Reactions  .  Sulfonamide Derivatives Swelling    No current facility-administered medications on file prior to encounter.    Current Outpatient Prescriptions on File Prior to Encounter  Medication Sig  . albuterol (PROVENTIL HFA;VENTOLIN HFA) 108 (90 Base) MCG/ACT inhaler Inhale 2 puffs into the lungs every 4 (four) hours as needed for wheezing. As a RESCUE medication  . amitriptyline (ELAVIL) 50 MG tablet Take 1 tablet (50 mg total) by mouth at bedtime.  Marland Kitchen amLODipine (NORVASC) 5 MG tablet Take 1 tablet (5 mg total) by mouth daily. (Patient taking differently: Take 5 mg by mouth every evening. )  . aspirin 81 MG tablet Take 81 mg by mouth every evening.   Marland Kitchen atorvastatin (LIPITOR) 80 MG tablet Take 1 tablet (80 mg total) by mouth daily. Please call and schedule appt for further refills - [phone] - 1st attempt  . B Complex Vitamins (VITAMIN-B COMPLEX PO) Take 1 tablet by mouth daily.   . bisacodyl (DULCOLAX) 5 MG EC tablet Take 1 tablet (5 mg total) by mouth daily as needed for moderate constipation. (Patient taking differently: Take 5 mg by mouth daily. )  . buPROPion (WELLBUTRIN SR) 150 MG 12 hr tablet Take 1 tablet (150 mg total) by mouth 2 (two) times daily.  . Calcium Carbonate-Vitamin D (CALCIUM + D PO) Take 600 mg by mouth daily.   . cilostazol (PLETAL) 100 MG tablet Take 1 tablet (100 mg total) by mouth 2 (two) times daily. Please call and schedule a one year follow up appointment for further refills  . Fluticasone-Salmeterol (ADVAIR DISKUS) 100-50 MCG/DOSE AEPB Inhale 1 puff into the lungs 2 (two) times daily.  Marland Kitchen ipratropium-albuterol (DUONEB) 0.5-2.5 (3) MG/3ML SOLN Take 3 mLs by nebulization every 6 (six) hours as  needed.  Marland Kitchen levothyroxine (SYNTHROID, LEVOTHROID) 112 MCG tablet TAKE ONE TABLET BY MOUTH ONE TIME DAILY  . LORazepam (ATIVAN) 2 MG tablet TAKE 1 TABLET BY MOUTH AT BEDTIME AS NEEDED FOR ANXIETY (Patient taking differently: TAKE 1 TABLET BY MOUTH AT BEDTIME)  . metoprolol tartrate  (LOPRESSOR) 25 MG tablet Take 1 tablet (25 mg total) by mouth 2 (two) times daily.  Marland Kitchen omeprazole (PRILOSEC) 40 MG capsule Take 1 capsule (40 mg total) by mouth 2 (two) times daily.  Marland Kitchen tiotropium (SPIRIVA HANDIHALER) 18 MCG inhalation capsule Place 1 capsule (18 mcg total) into inhaler and inhale daily.  Marland Kitchen amoxicillin-clavulanate (AUGMENTIN) 875-125 MG tablet Take 1 tablet by mouth 2 (two) times daily. (Patient not taking: Reported on 07/02/2016)  . nicotine (NICODERM CQ - DOSED IN MG/24 HOURS) 21 mg/24hr patch Place 1 patch (21 mg total) onto the skin daily. (Patient not taking: Reported on 07/02/2016)  . predniSONE (DELTASONE) 20 MG tablet Prednisone 60 mg daily for 2 more days followed by  Prednisone 40 mg daily for 3 days followed by  Prednisone 20 mg daily for 3 days (Patient not taking: Reported on 07/02/2016)    FAMILY HISTORY:  Her indicated that her mother is deceased. She indicated that her father is deceased. She indicated that four of her six sisters are alive. She indicated that only one of her two brothers is alive.    SOCIAL HISTORY: She  reports that she quit smoking about 3 months ago. Her smoking use included Cigarettes. She has a 37.50 pack-year smoking history. She has never used smokeless tobacco. She reports that she drinks about 6.0 oz of alcohol per week . She reports that she does not use drugs.  REVIEW OF SYSTEMS:   Negative except above.  SUBJECTIVE:  Feels like flutter valve and chest vest help.  VITAL SIGNS: BP 122/83 (BP Location: Left Arm)   Pulse 82   Temp 98 F (36.7 C) (Oral)   Resp 18   Ht '5\' 6"'$  (1.676 m)   Wt 140 lb (63.5 kg)   SpO2 93%   BMI 22.60 kg/m   INTAKE / OUTPUT: I/O last 3 completed shifts: In: 720 [P.O.:720] Out: 2600 [Urine:2600]  PHYSICAL EXAMINATION: General: sitting in chair, speaks in full sentences Neuro:  Alert, normal strength, CN intact HEENT:  No sinus tenderness, no oral exudate, no LAN Cardiovascular:  Regular, no  murmur Lungs:  B/l rhonchi with faint wheeze Abdomen:  Soft, non tender Musculoskeletal:  No edema Skin:  No rashes  LABS:  BMET  Recent Labs Lab 07/03/16 0256 07/04/16 0526 07/05/16 0323  NA 139 137 139  K 4.2 4.4 4.2  CL 105 103 105  CO2 '27 26 26  '$ BUN '11 16 20  '$ CREATININE 0.79 0.85 0.84  GLUCOSE 110* 133* 124*    Electrolytes  Recent Labs Lab 07/03/16 0256 07/04/16 0526 07/05/16 0323  CALCIUM 9.5 9.7 9.8    CBC  Recent Labs Lab 07/03/16 0256 07/04/16 0526 07/05/16 0323  WBC 7.0 6.6 9.7  HGB 14.5 14.8 14.4  HCT 43.8 45.2 43.7  PLT 354 381 401*    Coag's No results for input(s): APTT, INR in the last 168 hours.  Sepsis Markers No results for input(s): LATICACIDVEN, PROCALCITON, O2SATVEN in the last 168 hours.  ABG No results for input(s): PHART, PCO2ART, PO2ART in the last 168 hours.  Liver Enzymes  Recent Labs Lab 07/02/16 1235 07/03/16 0256  AST 28 20  ALT 26 22  ALKPHOS 64 51  BILITOT 0.7 0.5  ALBUMIN 4.5 3.7    Cardiac Enzymes  Recent Labs Lab 07/02/16 1235  TROPONINI <0.03    Glucose No results for input(s): GLUCAP in the last 168 hours.  Imaging No results found.   STUDIES:  PFT 03/28/11 >> FEV1 1.61 (70%), FEV1% 48, TLC 6.03 (115%), DLCO 69%, +BD  CULTURES:  ANTIBIOTICS: Levaquin 9/18 >>   SIGNIFICANT EVENTS: 9/18 Admit  LINES/TUBES:  DISCUSSION: 70 yo female smoker with dyspnea, cough, wheeze from COPD exacerbation with hx of COPD with emphysema.  ASSESSMENT / PLAN:  Acute hypoxic respiratory failure. Acute COPD exacerbation. COPD with emphysema. - continue solumedrol - continue levaquin -continue pulmicort >> increase dose - scheduled albuterol - restart spiriva - continue flutter valve, chest PT  Updated pt's husband at bedside.  Chesley Mires, MD [facility] Pulmonary/Critical Care 07/06/2016, 5:38 PM Pager:  [phone] After 3pm call: [phone]

## 2016-07-06 NOTE — Progress Notes (Signed)
Physical Therapy Treatment Patient Details Name: [name] MRN: [id number] DOB: Dec 07, 1945 Today's Date: 07/06/2016    History of Present Illness Pt admitted with COPD exacerbation. PMH: breast cancer, fibromyalgia, PVD, HTN, GERD, hypothyroidism.    PT Comments    Activity limited during session by dropping SpO2 during ambulation to 75% on 4L. Pt able to improve to 90% with time and pursed lip breathing. Pt initially in 3L with SpO2 at 81% at rest in room. Overall the pt mobilizes well but limited by fatigue and dropping SpO2. Unable to attempt stairs at this time. PT to continue to follow acutely.   Follow Up Recommendations  No PT follow up     Equipment Recommendations  None recommended by PT    Recommendations for Other Services       Precautions / Restrictions Precautions Precaution Comments: O2 sats Restrictions Weight Bearing Restrictions: No    Mobility  Bed Mobility               General bed mobility comments: in chair upon arrival  Transfers Overall transfer level: Independent Equipment used: None             General transfer comment: good technique  Ambulation/Gait Ambulation/Gait assistance: Supervision Ambulation Distance (Feet): 150 Feet Assistive device: None Gait Pattern/deviations: Step-through pattern Gait velocity: WFL   General Gait Details: mild drift to right with 1 LOB with independent recovery.    Stairs Stairs:  (unable to attempt due to O2 sats)          Wheelchair Mobility    Modified Rankin (Stroke Patients Only)       Balance Overall balance assessment: Needs assistance Sitting-balance support: No upper extremity supported Sitting balance-Leahy Scale: Normal     Standing balance support: No upper extremity supported Standing balance-Leahy Scale: Good                      Cognition Arousal/Alertness: Awake/alert Behavior During Therapy: WFL for tasks assessed/performed Overall Cognitive  Status: Within Functional Limits for tasks assessed                      Exercises      General Comments General comments (skin integrity, edema, etc.): SpO2 81% 3L upon arrival, increased to 4L with pursed lip breathing to increase to 90%. Attempted ambulation of 150 ft with SpO2 dropping to 75% on 4L. Pt above 90% after resting with pursed lip breathing. Nursing notified.       Pertinent Vitals/Pain Pain Assessment: No/denies pain    Home Living                      Prior Function            PT Goals (current goals can now be found in the care plan section) Acute Rehab PT Goals Patient Stated Goal: Go home PT Goal Formulation: With patient Time For Goal Achievement: 07/10/16 Potential to Achieve Goals: Good Progress towards PT goals: Progressing toward goals    Frequency    Min 2X/week      PT Plan Current plan remains appropriate    Co-evaluation             End of Session Equipment Utilized During Treatment: Oxygen;Gait belt Activity Tolerance: Other (comment) (limited by O2 sats) Patient left: in chair;with call bell/phone within reach     Time: 1550-1608 PT Time Calculation (min) (ACUTE ONLY): 18 min  Charges:  $  Gait Training: 8-22 mins                    G Codes:      Cassell Clement, PT, CSCS Pager [phone] Office [phone]  07/06/2016, 4:18 PM

## 2016-07-07 ENCOUNTER — Inpatient Hospital Stay (HOSPITAL_COMMUNITY): Payer: Medicare Other

## 2016-07-07 DIAGNOSIS — J9621 Acute and chronic respiratory failure with hypoxia: Secondary | ICD-10-CM

## 2016-07-07 LAB — BLOOD GAS, ARTERIAL
Acid-Base Excess: 4.4 mmol/L — ABNORMAL HIGH (ref 0.0–2.0)
Bicarbonate: 28.4 mmol/L — ABNORMAL HIGH (ref 20.0–28.0)
Drawn by: 270221
O2 Content: 4 L/min
O2 Saturation: 94.3 %
Patient temperature: 98.6
pCO2 arterial: 42.2 mmHg (ref 32.0–48.0)
pH, Arterial: 7.442 (ref 7.350–7.450)
pO2, Arterial: 71 mmHg — ABNORMAL LOW (ref 83.0–108.0)

## 2016-07-07 LAB — BASIC METABOLIC PANEL
Anion gap: 7 (ref 5–15)
BUN: 19 mg/dL (ref 6–20)
CO2: 28 mmol/L (ref 22–32)
Calcium: 9.4 mg/dL (ref 8.9–10.3)
Chloride: 101 mmol/L (ref 101–111)
Creatinine, Ser: 0.73 mg/dL (ref 0.44–1.00)
GFR calc Af Amer: >60 mL/min (ref >60)
GFR calc non Af Amer: >60 mL/min (ref >60)
Glucose, Bld: 132 mg/dL — ABNORMAL HIGH (ref 65–99)
Potassium: 4 mmol/L (ref 3.5–5.1)
Sodium: 136 mmol/L (ref 135–145)

## 2016-07-07 MED ORDER — IPRATROPIUM-ALBUTEROL 0.5-2.5 (3) MG/3ML IN SOLN
3.0000 mL | RESPIRATORY_TRACT | Status: DC
  Administered 2016-07-07 – 2016-07-10 (×18): 3 mL via RESPIRATORY_TRACT
  Filled 2016-07-07 (×19): qty 3

## 2016-07-07 MED ORDER — ALBUTEROL SULFATE (2.5 MG/3ML) 0.083% IN NEBU: 2.5000 mg | INHALATION_SOLUTION | RESPIRATORY_TRACT | Status: DC

## 2016-07-07 NOTE — Progress Notes (Signed)
PROGRESS NOTE    [name]  GUR:[id number] DOB: 1946/02/23 DOA: 07/02/2016 PCP: Hoyt Koch, MD    Brief Narrative:  [name] is a 70 year old female history of right breast cancer in remission, COPD, history of right upper lobe lung mass status post wedge resection 1993 presenting with progressive worsening shortness of breath for the past 3-4 days which have been present for at least 5 weeks. [name] with associated lower chest tightness. [name] being treated for acute hypoxic respiratory failure secondary to COPD exacerbation.   Assessment & Plan:   Principal Problem:   Acute respiratory failure with hypoxia (HCC) Active Problems:   COPD exacerbation (HCC)   Malignant neoplasm of female breast (HCC)   Hypothyroidism   Hyperlipidemia   Hearing loss   Essential hypertension   COPD (chronic obstructive pulmonary disease) (HCC)   GERD   Arthropathy   PAD (peripheral artery disease) (HCC)   Tobacco abuse   Chronic mesenteric ischemia (HCC)   CAD (coronary artery disease)   HLD (hyperlipidemia)   Thyroid activity decreased   #1 acute respiratory failure with hypoxia secondary to acute COPD exacerbation [name] still with significant shortness of breath and hypoxic on minimal exertion, however improved with nebulizer treatments and IV steroids since admission however no significant improvement over the past few days.  Continue IV Solu-Medrol taper, Pulmicort, scheduled nebulizers, Mucinex, Claritin, Flonase, PPI, oral Levaquin. Flutter valve. Chest PT. [name] was started on Spiriva and Pulmicort dose increased per pulmonary yesterday 07/06/2016. Will likely need to follow-up with pulmonary as outpatient. Will likely need home O2. Due to [name]'s poor progression, was consulted and some changes made in patients nebulizer/breathing treatments. Appreciate PCCM input and recommendations. pulmonary for further evaluation and management.  #2 hypothyroidism Continue  Synthroid.  #3 hypertension Stable. Continue home regimen of Lopressor, Norvasc.  #4 hyperlipidemia Continue statin.  #5 gastroesophageal reflux disease  PPI.  #6 peripheral vascular disease/CAD Continue aspirin, Pletal, statin, beta blockers, calcium channel blockers.  #7 tobacco abuse Per admitting physician [name] recently quit tobacco use.   #8 history of breast cancer in remission Stable. Outpatient follow-up.      DVT prophylaxis: Lovenox Code Status: Full Family Communication: Updated [name]. No family present. Disposition Plan: Home when shortness of breath and COPD exacerbation have improved.    Consultants:   Pulmonary: Dr. Halford Chessman 07/06/2016   Procedures:   Chest x-ray 07/03/2016, 07/02/2016  Antimicrobials:   IV doxycycline 1 dose  IV Levaquin 07/03/2016>>> oral Levaquin 07/05/2016   Subjective: [name] states no significant change with shortness of breath on minimal exertion.  No chest pain.   Objective: Vitals:   07/06/16 1627 07/06/16 2213 07/07/16 0458 07/07/16 0901  BP:  139/80 133/74   Pulse:  90 79   Resp:      Temp:  98.3 F (36.8 C) 97.5 F (36.4 C)   TempSrc:  Oral Oral   SpO2: 94% (!) 88% 93% 90%  Weight:      Height:        Intake/Output Summary (Last 24 hours) at 07/07/16 1321 Last data filed at 07/07/16 1054  Gross per 24 hour  Intake             1200 ml  Output             2250 ml  Net            -1050 ml   Filed Weights   07/02/16 1120  Weight: 63.5 kg (140 lb)  Examination:  General exam: Appears calm. Respiratory system: Poor-fair air movement. Diffuse wheezing improving. Less use of accessory muscles however speaking in full sentences.  Cardiovascular system: S1 & S2 heard, RRR. No JVD, murmurs, rubs, gallops or clicks. No pedal edema. Gastrointestinal system: Abdomen is nondistended, soft and nontender. No organomegaly or masses felt. Normal bowel sounds heard. Central nervous system: Alert and  oriented. No focal neurological deficits. Extremities: Symmetric 5 x 5 power. Skin: No rashes, lesions or ulcers Psychiatry: Judgement and insight appear normal. Mood & affect appropriate.     Data Reviewed: I have personally reviewed following labs and imaging studies  CBC:  Recent Labs Lab 07/02/16 1235 07/03/16 0256 07/04/16 0526 07/05/16 0323  WBC 7.5 7.0 6.6 9.7  NEUTROABS 4.7  --   --   --   HGB 16.7* 14.5 14.8 14.4  HCT 50.3* 43.8 45.2 43.7  MCV 94.2 93.4 94.2 93.8  PLT 361 354 381 185*   Basic Metabolic Panel:  Recent Labs Lab 07/02/16 1235 07/03/16 0256 07/04/16 0526 07/05/16 0323 07/07/16 0513  NA 138 139 137 139 136  K 4.3 4.2 4.4 4.2 4.0  CL 102 105 103 105 101  CO2 '26 27 26 26 28  '$ GLUCOSE 93 110* 133* 124* 132*  BUN '11 11 16 20 19  '$ CREATININE 0.98 0.79 0.85 0.84 0.73  CALCIUM 9.9 9.5 9.7 9.8 9.4   GFR: Estimated Creatinine Clearance: 60.4 mL/min (by C-G formula based on SCr of 0.73 mg/dL). Liver Function Tests:  Recent Labs Lab 07/02/16 1235 07/03/16 0256  AST 28 20  ALT 26 22  ALKPHOS 64 51  BILITOT 0.7 0.5  PROT 7.9 6.8  ALBUMIN 4.5 3.7   No results for input(s): LIPASE, AMYLASE in the last 168 hours. No results for input(s): AMMONIA in the last 168 hours. Coagulation Profile: No results for input(s): INR, PROTIME in the last 168 hours. Cardiac Enzymes:  Recent Labs Lab 07/02/16 1235  TROPONINI <0.03   BNP (last 3 results) No results for input(s): PROBNP in the last 8760 hours. HbA1C: No results for input(s): HGBA1C in the last 72 hours. CBG: No results for input(s): GLUCAP in the last 168 hours. Lipid Profile: No results for input(s): CHOL, HDL, LDLCALC, TRIG, CHOLHDL, LDLDIRECT in the last 72 hours. Thyroid Function Tests: No results for input(s): TSH, T4TOTAL, FREET4, T3FREE, THYROIDAB in the last 72 hours. Anemia Panel: No results for input(s): VITAMINB12, FOLATE, FERRITIN, TIBC, IRON, RETICCTPCT in the last 72  hours. Sepsis Labs: No results for input(s): PROCALCITON, LATICACIDVEN in the last 168 hours.  No results found for this or any previous visit (from the past 240 hour(s)).       Radiology Studies: No results found.      Scheduled Meds: . albuterol  2.5 mg Nebulization Q6H WA  . amitriptyline  50 mg Oral QHS  . amLODipine  5 mg Oral Daily  . aspirin EC  81 mg Oral Daily  . atorvastatin  80 mg Oral Daily  . budesonide (PULMICORT) nebulizer solution  0.5 mg Nebulization BID  . buPROPion  150 mg Oral BID  . cilostazol  100 mg Oral BID  . enoxaparin (LOVENOX) injection  40 mg Subcutaneous Q24H  . fluticasone  2 spray Each Nare Daily  . guaiFENesin  1,200 mg Oral BID  . levofloxacin  750 mg Oral Daily  . levothyroxine  112 mcg Oral QAC breakfast  . loratadine  10 mg Oral Daily  . methylPREDNISolone (SOLU-MEDROL) injection  80  mg Intravenous Q8H  . metoprolol tartrate  25 mg Oral BID  . pantoprazole  40 mg Oral Daily  . tiotropium  18 mcg Inhalation Daily   Continuous Infusions:    LOS: 4 days    Time spent: 35 minutes    Miosotis Wetsel, MD [facility] Pager [phone]  If 7PM-7AM, please contact night-coverage [email or web address] Password [email or web address] 07/07/2016, 1:21 PM

## 2016-07-07 NOTE — Progress Notes (Signed)
PULMONARY / CRITICAL CARE MEDICINE   Name: [name] MRN: [id number] DOB: December 07, 1945    ADMISSION DATE:  07/02/2016 CONSULTATION DATE:  07/06/2016  REFERRING MD:  Dr. Grandville Silos, [facility]  CHIEF COMPLAINT:  Short of breath  BRIEF 70 yo female smoker presented with dyspnea, cough, wheezing, hypoxia with SpO2 in 80s at home.  Chest xray was negative for infiltrate or effusion.  She had mildly elevated eosinophil count on CBD with differential.  She was admitted with COPD exacerbation.  She was started on solumedrol, levaquin, and BDs.  She continued to have difficulty with her breathing and PCCM consulted.  She was treated for pneumonia in July.  She was given antibiotics and prednisone.  She felt well for few weeks, but then started having trouble breathing again.  She has not smoked cigarettes for past few months.  She denies sick exposures.  She has noticed sinus congestion and sore throat.  She denies fever, skin rash, leg swelling, abdominal pain, or diarrhea.  PAST MEDICAL HISTORY :  She  has a past medical history of Cancer ([place]) (2010); Complication of anesthesia; COPD (chronic obstructive pulmonary disease) ([place]); Diverticulitis; Emphysema of lung ([place]); Fibromyalgia (1995); GERD (gastroesophageal reflux disease); Hearing loss of both ears; colonic polyps; Hyperlipidemia; Hypertension; Hypothyroidism; Osteoporosis; Peripheral vascular disease [facility]) (May '12 - CT angio); Shortness of breath dyspnea; Varicose veins of leg with swelling; and Wears glasses.  PAST SURGICAL HISTORY: She  has a past surgical history that includes Appendectomy; Wedge resection (1993); Colonoscopy w/ polypectomy; Abdominal hysterectomy (1973); Colectomy (2002); Cardiac catheterization ('11); Breast surgery (2010); Breast surgery (2010); Breast surgery (2010); Lung surgery (9/12); and Partial mastectomy with needle localization (Left, 02/23/2013).    STUDIES:  PFT 03/28/11 >> FEV1 1.61 (70%), FEV1% 48, TLC 6.03  (115%), DLCO 69%, +BD  CULTURES:  ANTIBIOTICS: Levaquin 9/18 >>   SIGNIFICANT EVENTS: 9/18 Admit  LINES/TUBES:   SUBJECTIVE/OVERNIGHT/INTERVAL HX 07/07/16 =Feels like flutter valve and chest vest help but overall not better since admit 07/02/2016 . 3-4L o2 need  Is new since admit but not worse. Gets dyspneic talking and walking. Feels exhausted.   VITAL SIGNS: BP 135/74 (BP Location: Right Arm)   Pulse 84   Temp 98.1 F (36.7 C) (Oral)   Resp 18   Ht '5\' 6"'$  (1.676 m)   Wt 63.5 kg (140 lb)   SpO2 93%   BMI 22.60 kg/m   INTAKE / OUTPUT: I/O last 3 completed shifts: In: 1680 [P.O.:1680] Out: 3725 [[id number]]  PHYSICAL EXAMINATION: General: sitting in chair, speaks in full sentences Neuro:  Alert, normal strength, CN intact HEENT:  No sinus tenderness, no oral exudate, no LAN Cardiovascular:  Regular, no murmur Lungs:  B/l rhonchi with NOt wheeze. BARRELL CHEST + Abdomen:  Soft, non tender Musculoskeletal:  No edema Skin:  No rashes  LABS:  PULMONARY No results for input(s): PHART, PCO2ART, PO2ART, HCO3, TCO2, O2SAT in the last 168 hours.  Invalid input(s): PCO2, PO2  CBC  Recent Labs Lab 07/03/16 0256 07/04/16 0526 07/05/16 0323  HGB 14.5 14.8 14.4  HCT 43.8 45.2 43.7  WBC 7.0 6.6 9.7  PLT 354 381 401*    COAGULATION No results for input(s): INR in the last 168 hours.  CARDIAC   Recent Labs Lab 07/02/16 1235  TROPONINI <0.03   No results for input(s): PROBNP in the last 168 hours.   CHEMISTRY  Recent Labs Lab 07/02/16 1235 07/03/16 0256 07/04/16 0526 07/05/16 0323 07/07/16 0513  NA 138 139 137  139 136  K 4.3 4.2 4.4 4.2 4.0  CL 102 105 103 105 101  CO2 '26 27 26 26 28  '$ GLUCOSE 93 110* 133* 124* 132*  BUN '11 11 16 20 19  '$ CREATININE 0.98 0.79 0.85 0.84 0.73  CALCIUM 9.9 9.5 9.7 9.8 9.4   Estimated Creatinine Clearance: 60.4 mL/min (by C-G formula based on SCr of 0.73 mg/dL).   LIVER  Recent Labs Lab 07/02/16 1235  07/03/16 0256  AST 28 20  ALT 26 22  ALKPHOS 64 51  BILITOT 0.7 0.5  PROT 7.9 6.8  ALBUMIN 4.5 3.7     INFECTIOUS No results for input(s): LATICACIDVEN, PROCALCITON in the last 168 hours.   ENDOCRINE CBG (last 3)  No results for input(s): GLUCAP in the last 72 hours.       IMAGING x48h  - image(s) personally visualized  -   highlighted in bold No results found.     DISCUSSION: 70 yo female smoker with dyspnea, cough, wheeze from COPD exacerbation with hx of COPD with emphysema.  ASSESSMENT / PLAN:  Acute hypoxic respiratory failure. Acute COPD exacerbation. COPD with emphysema.   07/07/16 - just not feeling better  PLAN  - get abg, cxr, echo - start QHS stable bipap - continue solumedrol - continue levaquin -continue pulmicort >>  - scheduled albuterol increase to q4h + atrovent q4h -dc spiriva - do atroven nebs   Updated patient    Dr. Brand Males, M.D., [facility].C.P Pulmonary and Critical Care Medicine Staff Physician [place] [facility] Pager: [phone], If no answer or between  15:00h - 7:00h: call [phone]  07/07/2016 3:36 PM

## 2016-07-08 LAB — BASIC METABOLIC PANEL
Anion gap: 8 (ref 5–15)
BUN: 19 mg/dL (ref 6–20)
CO2: 31 mmol/L (ref 22–32)
Calcium: 8.9 mg/dL (ref 8.9–10.3)
Chloride: 97 mmol/L — ABNORMAL LOW (ref 101–111)
Creatinine, Ser: 0.74 mg/dL (ref 0.44–1.00)
GFR calc Af Amer: >60 mL/min (ref >60)
GFR calc non Af Amer: >60 mL/min (ref >60)
Glucose, Bld: 141 mg/dL — ABNORMAL HIGH (ref 65–99)
Potassium: 4.4 mmol/L (ref 3.5–5.1)
Sodium: 136 mmol/L (ref 135–145)

## 2016-07-08 MED ORDER — PREDNISONE 50 MG PO TABS
50.0000 mg | ORAL_TABLET | Freq: Every day | ORAL | Status: DC
  Administered 2016-07-09 – 2016-07-10 (×2): 50 mg via ORAL
  Filled 2016-07-08 (×2): qty 1

## 2016-07-08 MED ORDER — LEVOFLOXACIN 500 MG PO TABS
500.0000 mg | ORAL_TABLET | Freq: Every day | ORAL | Status: DC
  Administered 2016-07-09: 500 mg via ORAL
  Filled 2016-07-08: qty 1

## 2016-07-08 MED ORDER — NYSTATIN 100000 UNIT/ML MT SUSP
5.0000 mL | Freq: Four times a day (QID) | OROMUCOSAL | Status: DC
  Administered 2016-07-09 – 2016-07-10 (×5): 500000 [IU] via ORAL
  Filled 2016-07-08 (×6): qty 5

## 2016-07-08 NOTE — Progress Notes (Signed)
PULMONARY / CRITICAL CARE MEDICINE   Name: [name] MRN: [id number] DOB: Sep 02, 1946    ADMISSION DATE:  07/02/2016 CONSULTATION DATE:  07/06/2016  REFERRING MD:  Dr. Grandville Silos, [facility]  CHIEF COMPLAINT:  Short of breath  BRIEF 70 yo female smoker presented with dyspnea, cough, wheezing, hypoxia with SpO2 in 80s at home.  Chest xray was negative for infiltrate or effusion.  She had mildly elevated eosinophil count on CBD with differential.  She was admitted with COPD exacerbation.  She was started on solumedrol, levaquin, and BDs.  She continued to have difficulty with her breathing and PCCM consulted.  She was treated for pneumonia in July.  She was given antibiotics and prednisone.  She felt well for few weeks, but then started having trouble breathing again.  She has not smoked cigarettes for past few months.  She denies sick exposures.  She has noticed sinus congestion and sore throat.  She denies fever, skin rash, leg swelling, abdominal pain, or diarrhea.  PAST MEDICAL HISTORY :  She  has a past medical history of Cancer ([place]) (2010); Complication of anesthesia; COPD (chronic obstructive pulmonary disease) ([place]); Diverticulitis; Emphysema of lung ([place]); Fibromyalgia (1995); GERD (gastroesophageal reflux disease); Hearing loss of both ears; colonic polyps; Hyperlipidemia; Hypertension; Hypothyroidism; Osteoporosis; Peripheral vascular disease [facility]) (May '12 - CT angio); Shortness of breath dyspnea; Varicose veins of leg with swelling; and Wears glasses.  PAST SURGICAL HISTORY: She  has a past surgical history that includes Appendectomy; Wedge resection (1993); Colonoscopy w/ polypectomy; Abdominal hysterectomy (1973); Colectomy (2002); Cardiac catheterization ('11); Breast surgery (2010); Breast surgery (2010); Breast surgery (2010); Lung surgery (9/12); and Partial mastectomy with needle localization (Left, 02/23/2013).    STUDIES:  PFT 03/28/11 >> FEV1 1.61 (70%), FEV1% 48, TLC 6.03  (115%), DLCO 69%, +BD  CULTURES:  ANTIBIOTICS: Levaquin 9/18 >>   SIGNIFICANT EVENTS: 9/18 Admit  07/07/16 =Feels like flutter valve and chest vest help but overall not better since admit 07/02/2016 . 3-4L o2 need  Is new since admit but not worse. Gets dyspneic talking and walking. Feels exhausted.    SUBJECTIVE/OVERNIGHT/INTERVAL HX 07/08/16 - now on bipap qhs. + increased neb frequency + ordered echo +rpeat cxsr abg -> cxr/ABG ok. FEels much better. Thinks bipap QHS made the difference  VITAL SIGNS: BP (!) 143/84 (BP Location: Left Arm)   Pulse 92   Temp 97.9 F (36.6 C) (Oral)   Resp 18   Ht '5\' 6"'$  (1.676 m)   Wt 63.5 kg (140 lb)   SpO2 93%   BMI 22.60 kg/m   INTAKE / OUTPUT: I/O last 3 completed shifts: In: 1680 [P.O.:1680] Out: 1001 [Urine:1000; Stool:1]  PHYSICAL EXAMINATION: General: sitting in chair, speaks in full sentences Neuro:  Alert, normal strength, CN intact HEENT:  No sinus tenderness, no oral exudate, no LAN Cardiovascular:  Regular, no murmur Lungs:  B/l rhonchi with NOt wheeze. BARRELL CHEST + Abdomen:  Soft, non tender Musculoskeletal:  No edema Skin:  No rashes  LABS:  PULMONARY  Recent Labs Lab 07/07/16 1610  PHART 7.442  PCO2ART 42.2  PO2ART 71.0*  HCO3 28.4*  O2SAT 94.3    CBC  Recent Labs Lab 07/03/16 0256 07/04/16 0526 07/05/16 0323  HGB 14.5 14.8 14.4  HCT 43.8 45.2 43.7  WBC 7.0 6.6 9.7  PLT 354 381 401*    COAGULATION No results for input(s): INR in the last 168 hours.  CARDIAC    Recent Labs Lab 07/02/16 1235  TROPONINI <0.03  No results for input(s): PROBNP in the last 168 hours.   CHEMISTRY  Recent Labs Lab 07/03/16 0256 07/04/16 0526 07/05/16 0323 07/07/16 0513 07/08/16 0121  NA 139 137 139 136 136  K 4.2 4.4 4.2 4.0 4.4  CL 105 103 105 101 97*  CO2 '27 26 26 28 31  '$ GLUCOSE 110* 133* 124* 132* 141*  BUN '11 16 20 19 19  '$ CREATININE 0.79 0.85 0.84 0.73 0.74  CALCIUM 9.5 9.7 9.8 9.4 8.9    Estimated Creatinine Clearance: 60.4 mL/min (by C-G formula based on SCr of 0.74 mg/dL).   LIVER  Recent Labs Lab 07/02/16 1235 07/03/16 0256  AST 28 20  ALT 26 22  ALKPHOS 64 51  BILITOT 0.7 0.5  PROT 7.9 6.8  ALBUMIN 4.5 3.7     INFECTIOUS No results for input(s): LATICACIDVEN, PROCALCITON in the last 168 hours.   ENDOCRINE CBG (last 3)  No results for input(s): GLUCAP in the last 72 hours.       IMAGING x48h  - image(s) personally visualized  -   highlighted in bold Dg Chest Port 1 View  Result Date: 07/07/2016 CLINICAL DATA:  Acute on chronic respiratory failure with hypoxia, recent pneumonia. Hx of HTN. EXAM: PORTABLE CHEST 1 VIEW COMPARISON:  07/02/2016 FINDINGS: Midline trachea. Normal heart size. Atherosclerosis in the transverse aorta. No pleural effusion or pneumothorax. Hyperinflation. Diffuse peribronchial thickening. Biapical pleural-parenchymal scarring. No lobar consolidation. Surgical clips project over the right hemi thorax. IMPRESSION: COPD/chronic bronchitis.  No acute superimposed process. Electronically Signed   By: Abigail Miyamoto M.D.   On: 07/07/2016 16:37       DISCUSSION: 70 yo female smoker with dyspnea, cough, wheeze from COPD exacerbation with hx of COPD with emphysema.  ASSESSMENT / PLAN:  Acute hypoxic respiratory failure. Acute COPD exacerbation. COPD with emphysema.  07/08/16 - much improved after bipap qhs and increased nebs   PLAN - continue QHS stable bipap - will need to see if she can go home on this - change solumedrol to prednisone - continue levaquin but change from 750 to '500mg'$  daily - recommend 5-6 days total only -continue pulmicort nebs  - will need change to ICS at dc - scheduled albuterol increase to q4h + atrovent q4h; might need to go home on nebs - await echo - opd pulm rehab an pccm fu needed   Updated patient and 42 female family members and RT at bedisde    Dr. Brand Males, M.D., [facility].C.P Pulmonary  and Critical Care Medicine Staff Physician [place] [facility] Pager: [phone], If no answer or between  15:00h - 7:00h: call [phone]  07/08/2016 12:06 PM

## 2016-07-08 NOTE — Progress Notes (Signed)
PROGRESS NOTE    [name]  KZS:[id number] DOB: 1945/12/24 DOA: 07/02/2016 PCP: Hoyt Koch, MD    Brief Narrative:  Patient is a 70 year old female history of right breast cancer in remission, COPD, history of right upper lobe lung mass status post wedge resection 1993 presenting with progressive worsening shortness of breath for the past 3-4 days which have been present for at least 5 weeks. Patient with associated lower chest tightness. Patient being treated for acute hypoxic respiratory failure secondary to COPD exacerbation.   Assessment & Plan:   Principal Problem:   Acute on chronic respiratory failure with hypoxia (HCC) Active Problems:   COPD exacerbation (HCC)   Malignant neoplasm of female breast (HCC)   Hypothyroidism   Hyperlipidemia   Hearing loss   Essential hypertension   COPD (chronic obstructive pulmonary disease) (HCC)   GERD   Arthropathy   PAD (peripheral artery disease) (HCC)   Tobacco abuse   Chronic mesenteric ischemia (HCC)   CAD (coronary artery disease)   HLD (hyperlipidemia)   Thyroid activity decreased   #1 acute respiratory failure with hypoxia secondary to acute COPD exacerbation Patient states improvement with shortness of breath and hypoxic on minimal exertion after placed on BiPAP at bedtime yesterday. Patient also initially had some improvement with nebulizer treatments and IV steroids since admission however no significant improvement over the past few days, until she was placed on Bipap.  Patient has been seen by pulmonary and patient was placed on BiPAP daily at bedtime yesterday 07/07/2016 with some clinical improvement. Dose of Levaquin has been decreased to 500 mg daily per pulmonary. Scheduled albuterol and Atrovent nebulizers haven't changed every 4 hours. 2-D echo pending. IV Solu-Medrol has been changed to oral prednisone. Continue Mucinex, Claritin, PPI. Flutter valve. Chest PT. Spiriva has been discontinued.  Pulmicort  dose increased per pulmonary 07/06/2016. Will likely need to follow-up with pulmonary as outpatient and pulmonary rehabilitation. Will likely need home O2. Pulmonary following and appreciate input and recommendations.  #2 hypothyroidism Continue Synthroid.  #3 hypertension Stable. Continue home regimen of Lopressor, Norvasc.  #4 hyperlipidemia Continue statin.  #5 gastroesophageal reflux disease  PPI.  #6 peripheral vascular disease/CAD Continue aspirin, Pletal, statin, beta blockers, calcium channel blockers.  #7 tobacco abuse Per admitting physician patient recently quit tobacco use.   #8 history of breast cancer in remission Stable. Outpatient follow-up.      DVT prophylaxis: Lovenox Code Status: Full Family Communication: Updated patient and husband at bedside. Disposition Plan: Home when shortness of breath and COPD exacerbation have improved, and per pulmonary.   Consultants:   Pulmonary: Dr. Halford Chessman 07/06/2016   Procedures:   Chest x-ray 07/03/2016, 07/02/2016  Antimicrobials:   IV doxycycline 1 dose  IV Levaquin 07/03/2016>>> oral Levaquin 07/05/2016   Subjective: Patient states was able to sleep overnight with BiPAP and some improvement with her shortness of breath. No chest pain.   Objective: Vitals:   07/07/16 2104 07/08/16 0031 07/08/16 0533 07/08/16 1352  BP: (!) 141/76  (!) 143/84 (!) 142/77  Pulse: 80 90 92 84  Resp: '18 20 18 18  '$ Temp: 98.3 F (36.8 C)  97.9 F (36.6 C) 98.3 F (36.8 C)  TempSrc: Oral  Oral Oral  SpO2: 93%   90%  Weight:      Height:        Intake/Output Summary (Last 24 hours) at 07/08/16 1801 Last data filed at 07/08/16 1752  Gross per 24 hour  Intake  120 ml  Output              950 ml  Net             -830 ml   Filed Weights   07/02/16 1120  Weight: 63.5 kg (140 lb)    Examination:  General exam: Appears calm. Respiratory system: Poor-fair air movement. Diffuse wheezing improving. Less use  of accessory muscles however speaking in full sentences. Barrel chest Cardiovascular system: S1 & S2 heard, RRR. No JVD, murmurs, rubs, gallops or clicks. No pedal edema. Gastrointestinal system: Abdomen is nondistended, soft and nontender. No organomegaly or masses felt. Normal bowel sounds heard. Central nervous system: Alert and oriented. No focal neurological deficits. Extremities: Symmetric 5 x 5 power. Skin: No rashes, lesions or ulcers Psychiatry: Judgement and insight appear normal. Mood & affect appropriate.     Data Reviewed: I have personally reviewed following labs and imaging studies  CBC:  Recent Labs Lab 07/02/16 1235 07/03/16 0256 07/04/16 0526 07/05/16 0323  WBC 7.5 7.0 6.6 9.7  NEUTROABS 4.7  --   --   --   HGB 16.7* 14.5 14.8 14.4  HCT 50.3* 43.8 45.2 43.7  MCV 94.2 93.4 94.2 93.8  PLT 361 354 381 993*   Basic Metabolic Panel:  Recent Labs Lab 07/03/16 0256 07/04/16 0526 07/05/16 0323 07/07/16 0513 07/08/16 0121  NA 139 137 139 136 136  K 4.2 4.4 4.2 4.0 4.4  CL 105 103 105 101 97*  CO2 '27 26 26 28 31  '$ GLUCOSE 110* 133* 124* 132* 141*  BUN '11 16 20 19 19  '$ CREATININE 0.79 0.85 0.84 0.73 0.74  CALCIUM 9.5 9.7 9.8 9.4 8.9   GFR: Estimated Creatinine Clearance: 60.4 mL/min (by C-G formula based on SCr of 0.74 mg/dL). Liver Function Tests:  Recent Labs Lab 07/02/16 1235 07/03/16 0256  AST 28 20  ALT 26 22  ALKPHOS 64 51  BILITOT 0.7 0.5  PROT 7.9 6.8  ALBUMIN 4.5 3.7   No results for input(s): LIPASE, AMYLASE in the last 168 hours. No results for input(s): AMMONIA in the last 168 hours. Coagulation Profile: No results for input(s): INR, PROTIME in the last 168 hours. Cardiac Enzymes:  Recent Labs Lab 07/02/16 1235  TROPONINI <0.03   BNP (last 3 results) No results for input(s): PROBNP in the last 8760 hours. HbA1C: No results for input(s): HGBA1C in the last 72 hours. CBG: No results for input(s): GLUCAP in the last 168  hours. Lipid Profile: No results for input(s): CHOL, HDL, LDLCALC, TRIG, CHOLHDL, LDLDIRECT in the last 72 hours. Thyroid Function Tests: No results for input(s): TSH, T4TOTAL, FREET4, T3FREE, THYROIDAB in the last 72 hours. Anemia Panel: No results for input(s): VITAMINB12, FOLATE, FERRITIN, TIBC, IRON, RETICCTPCT in the last 72 hours. Sepsis Labs: No results for input(s): PROCALCITON, LATICACIDVEN in the last 168 hours.  No results found for this or any previous visit (from the past 240 hour(s)).       Radiology Studies: Dg Chest Port 1 View  Result Date: 07/07/2016 CLINICAL DATA:  Acute on chronic respiratory failure with hypoxia, recent pneumonia. Hx of HTN. EXAM: PORTABLE CHEST 1 VIEW COMPARISON:  07/02/2016 FINDINGS: Midline trachea. Normal heart size. Atherosclerosis in the transverse aorta. No pleural effusion or pneumothorax. Hyperinflation. Diffuse peribronchial thickening. Biapical pleural-parenchymal scarring. No lobar consolidation. Surgical clips project over the right hemi thorax. IMPRESSION: COPD/chronic bronchitis.  No acute superimposed process. Electronically Signed   By: Adria Devon.D.  On: 07/07/2016 16:37        Scheduled Meds: . amitriptyline  50 mg Oral QHS  . amLODipine  5 mg Oral Daily  . aspirin EC  81 mg Oral Daily  . atorvastatin  80 mg Oral Daily  . budesonide (PULMICORT) nebulizer solution  0.5 mg Nebulization BID  . buPROPion  150 mg Oral BID  . cilostazol  100 mg Oral BID  . enoxaparin (LOVENOX) injection  40 mg Subcutaneous Q24H  . fluticasone  2 spray Each Nare Daily  . guaiFENesin  1,200 mg Oral BID  . ipratropium-albuterol  3 mL Nebulization Q4H  . [START ON 07/09/2016] levofloxacin  500 mg Oral Daily  . levothyroxine  112 mcg Oral QAC breakfast  . loratadine  10 mg Oral Daily  . metoprolol tartrate  25 mg Oral BID  . pantoprazole  40 mg Oral Daily  . [START ON 07/09/2016] predniSONE  50 mg Oral Q breakfast   Continuous Infusions:     LOS: 5 days    Time spent: 35 minutes    Zaid Tomes, MD [facility] Pager [phone]  If 7PM-7AM, please contact night-coverage [email or web address] Password [email or web address] 07/08/2016, 6:01 PM

## 2016-07-08 NOTE — Progress Notes (Signed)
Patient placed on bipap for hs per md order.  FFM used as patient is mouth breather.  Equipment and procedure as well as importance of compliance with night time bipap discussed with patient.  Patient tolerated well, expressing understanding and was given opportunity to express questions and concerns.  RN aware.  5 LPM oxygen bleed in used.

## 2016-07-09 ENCOUNTER — Inpatient Hospital Stay (HOSPITAL_COMMUNITY): Payer: Medicare Other

## 2016-07-09 DIAGNOSIS — R0600 Dyspnea, unspecified: Secondary | ICD-10-CM

## 2016-07-09 DIAGNOSIS — J209 Acute bronchitis, unspecified: Secondary | ICD-10-CM

## 2016-07-09 LAB — PHOSPHORUS: Phosphorus: 3.7 mg/dL (ref 2.5–4.6)

## 2016-07-09 LAB — ECHOCARDIOGRAM COMPLETE
Height: 66 in
Weight: 2240 oz

## 2016-07-09 LAB — MAGNESIUM: Magnesium: 2.1 mg/dL (ref 1.7–2.4)

## 2016-07-09 NOTE — Progress Notes (Signed)
Patient O2 Sat on room air at rest 86%

## 2016-07-09 NOTE — Progress Notes (Addendum)
Physical Therapy Treatment/Discharge Patient Details Name: [name] MRN: [id number] DOB: Dec 14, 1945 Today's Date: 07/09/2016    History of Present Illness Pt admitted with COPD exacerbation. PMH: breast cancer, fibromyalgia, PVD, HTN, GERD, hypothyroidism.    PT Comments    Pt making good progress with mobility, ambulating 175 ft without and assistive device and up/down 12 steps. The patient's SpO2 did drop to 85% following ambulation but returning to 90% within 1 minute of returning and focused breathing. At this time the pt has progressed well and both the pt and PT feel that she can continue on without formal PT services. Will D/C PT at this time in anticipation of D/C to home once medically released.    Follow Up Recommendations  No PT follow up     Equipment Recommendations  None recommended by PT    Recommendations for Other Services       Precautions / Restrictions Precautions Precaution Comments: SpO2 Restrictions Weight Bearing Restrictions: No    Mobility  Bed Mobility               General bed mobility comments: in chair upon arrival  Transfers Overall transfer level: Independent                  Ambulation/Gait Ambulation/Gait assistance: Independent Ambulation Distance (Feet): 175 Feet Assistive device: None Gait Pattern/deviations: WFL(Within Functional Limits) Gait velocity: WFL   General Gait Details: good stability, no loss of balance   Stairs Stairs: Yes Stairs assistance: Supervision Stair Management: One rail Right;Alternating pattern;Forwards Number of Stairs: 12 General stair comments: good stability  Wheelchair Mobility    Modified Rankin (Stroke Patients Only)       Balance Overall balance assessment: No apparent balance deficits (not formally assessed)                                  Cognition Arousal/Alertness: Awake/alert Behavior During Therapy: WFL for tasks assessed/performed Overall  Cognitive Status: Within Functional Limits for tasks assessed                      Exercises      General Comments General comments (skin integrity, edema, etc.): SpO2; 91% on 4L prior to ambulation, 85% on 6L upon return from ambulation and returning to 90% within 60 seconds.       Pertinent Vitals/Pain Pain Assessment: No/denies pain Pain Intervention(s): Monitored during session    Home Living                      Prior Function            PT Goals (current goals can now be found in the care plan section) Acute Rehab PT Goals Patient Stated Goal: Go home PT Goal Formulation: With patient Time For Goal Achievement: 07/10/16 Potential to Achieve Goals: Good Progress towards PT goals: Goals met/education completed, patient discharged from PT    Frequency           PT Plan Current plan remains appropriate    Co-evaluation             End of Session Equipment Utilized During Treatment: Oxygen;Gait belt Activity Tolerance: Patient tolerated treatment well Patient left: in chair;with call bell/phone within reach     Time: 0827-0843 PT Time Calculation (min) (ACUTE ONLY): 16 min  Charges:  $Gait Training: 8-22 mins  G Codes:      Cassell Clement, PT, CSCS Pager [phone] Office [phone]  07/09/2016, 10:01 AM

## 2016-07-09 NOTE — Progress Notes (Signed)
PROGRESS NOTE    [name]  WGN:[id number] DOB: [birth year] DOA: 07/02/2016 PCP: Hoyt Koch, MD    Brief Narrative:  Patient is a 70 year old female history of right breast cancer in remission, COPD, history of right upper lobe lung mass status post wedge resection 1993 presenting with progressive worsening shortness of breath for the past 3-4 days which have been present for at least 5 weeks. Patient with associated lower chest tightness. Patient being treated for acute hypoxic respiratory failure secondary to COPD exacerbation.   Assessment & Plan:   Principal Problem:   Acute on chronic respiratory failure with hypoxia (HCC) Active Problems:   COPD exacerbation (HCC)   Malignant neoplasm of female breast (HCC)   Hypothyroidism   Hyperlipidemia   Hearing loss   Essential hypertension   COPD (chronic obstructive pulmonary disease) (HCC)   GERD   Arthropathy   PAD (peripheral artery disease) (HCC)   Tobacco abuse   Chronic mesenteric ischemia (HCC)   CAD (coronary artery disease)   HLD (hyperlipidemia)   Thyroid activity decreased   #1 acute respiratory failure with hypoxia secondary to acute COPD exacerbation Patient states improvement with shortness of breath and hypoxic on minimal exertion after placed on BiPAP at bedtime.  Patient also initially had some improvement with nebulizer treatments and IV steroids since admission however no significant improvement over the past few days, until she was placed on Bipap.  Patient has been seen by pulmonary and patient was placed on BiPAP daily at bedtime yesterday 07/07/2016 with some clinical improvement. Dose of Levaquin has been decreased to 500 mg daily per pulmonary. Scheduled albuterol and Atrovent nebulizers. 2-D echo pending. IV Solu-Medrol has been changed to oral prednisone taper. Continue Mucinex, Claritin, PPI. Flutter valve. Chest PT. Spiriva has been discontinued.  Pulmicort dose increased per pulmonary  07/06/2016. Will likely need to follow-up with pulmonary as outpatient and pulmonary rehabilitation. Will likely need home O2. Pulmonary following and appreciate input and recommendations.  #2 hypothyroidism Continue Synthroid.  #3 hypertension Stable. Continue home regimen of Lopressor, Norvasc.  #4 hyperlipidemia Continue statin.  #5 gastroesophageal reflux disease  PPI.  #6 peripheral vascular disease/CAD Continue aspirin, Pletal, statin, beta blockers, calcium channel blockers.  #7 tobacco abuse Per admitting physician patient recently quit tobacco use.   #8 history of breast cancer in remission Stable. Outpatient follow-up.      DVT prophylaxis: Lovenox Code Status: Full Family Communication: Updated patient and husband at bedside. Disposition Plan: Home when shortness of breath and COPD exacerbation have improved, and per pulmonary.   Consultants:   Pulmonary: Dr. Halford Chessman 07/06/2016   Procedures:   Chest x-ray 07/03/2016, 07/02/2016  2-D echo 07/09/2016  Antimicrobials:   IV doxycycline 1 dose  IV Levaquin 07/03/2016>>> oral Levaquin 07/05/2016   Subjective: Patient states shortness of breath improving daily with use of BiPAP at night and occasionally throughout the day. Patient denies any chest pain. Patient states ambulating in hallway on oxygen with physical therapy.   Objective: Vitals:   07/09/16 0450 07/09/16 0805 07/09/16 0810 07/09/16 1144  BP: 127/83     Pulse: 77     Resp: 17     Temp: 97.9 F (36.6 C)     TempSrc: Oral     SpO2: 100% 91% 92% 92%  Weight:      Height:        Intake/Output Summary (Last 24 hours) at 07/09/16 1357 Last data filed at 07/09/16 0900  Gross per 24 hour  Intake  600 ml  Output             1675 ml  Net            -1075 ml   Filed Weights   07/02/16 1120  Weight: 63.5 kg (140 lb)    Examination:  General exam: Appears calm. Respiratory system: Poor-fair air movement. Diffuse wheezing  improving. Less use of accessory muscles however speaking in full sentences. Barrel chest Cardiovascular system: S1 & S2 heard, RRR. No JVD, murmurs, rubs, gallops or clicks. No pedal edema. Gastrointestinal system: Abdomen is nondistended, soft and nontender. No organomegaly or masses felt. Normal bowel sounds heard. Central nervous system: Alert and oriented. No focal neurological deficits. Extremities: Symmetric 5 x 5 power. Skin: No rashes, lesions or ulcers Psychiatry: Judgement and insight appear normal. Mood & affect appropriate.     Data Reviewed: I have personally reviewed following labs and imaging studies  CBC:  Recent Labs Lab 07/03/16 0256 07/04/16 0526 07/05/16 0323  WBC 7.0 6.6 9.7  HGB 14.5 14.8 14.4  HCT 43.8 45.2 43.7  MCV 93.4 94.2 93.8  PLT 354 381 001*   Basic Metabolic Panel:  Recent Labs Lab 07/03/16 0256 07/04/16 0526 07/05/16 0323 07/07/16 0513 07/08/16 0121 07/09/16 0556  NA 139 137 139 136 136  --   K 4.2 4.4 4.2 4.0 4.4  --   CL 105 103 105 101 97*  --   CO2 '27 26 26 28 31  '$ --   GLUCOSE 110* 133* 124* 132* 141*  --   BUN '11 16 20 19 19  '$ --   CREATININE 0.79 0.85 0.84 0.73 0.74  --   CALCIUM 9.5 9.7 9.8 9.4 8.9  --   MG  --   --   --   --   --  2.1  PHOS  --   --   --   --   --  3.7   GFR: Estimated Creatinine Clearance: 60.4 mL/min (by C-G formula based on SCr of 0.74 mg/dL). Liver Function Tests:  Recent Labs Lab 07/03/16 0256  AST 20  ALT 22  ALKPHOS 51  BILITOT 0.5  PROT 6.8  ALBUMIN 3.7   No results for input(s): LIPASE, AMYLASE in the last 168 hours. No results for input(s): AMMONIA in the last 168 hours. Coagulation Profile: No results for input(s): INR, PROTIME in the last 168 hours. Cardiac Enzymes: No results for input(s): CKTOTAL, CKMB, CKMBINDEX, TROPONINI in the last 168 hours. BNP (last 3 results) No results for input(s): PROBNP in the last 8760 hours. HbA1C: No results for input(s): HGBA1C in the last 72  hours. CBG: No results for input(s): GLUCAP in the last 168 hours. Lipid Profile: No results for input(s): CHOL, HDL, LDLCALC, TRIG, CHOLHDL, LDLDIRECT in the last 72 hours. Thyroid Function Tests: No results for input(s): TSH, T4TOTAL, FREET4, T3FREE, THYROIDAB in the last 72 hours. Anemia Panel: No results for input(s): VITAMINB12, FOLATE, FERRITIN, TIBC, IRON, RETICCTPCT in the last 72 hours. Sepsis Labs: No results for input(s): PROCALCITON, LATICACIDVEN in the last 168 hours.  No results found for this or any previous visit (from the past 240 hour(s)).       Radiology Studies: Dg Chest Port 1 View  Result Date: 07/07/2016 CLINICAL DATA:  Acute on chronic respiratory failure with hypoxia, recent pneumonia. Hx of HTN. EXAM: PORTABLE CHEST 1 VIEW COMPARISON:  07/02/2016 FINDINGS: Midline trachea. Normal heart size. Atherosclerosis in the transverse aorta. No pleural effusion or pneumothorax. Hyperinflation.  Diffuse peribronchial thickening. Biapical pleural-parenchymal scarring. No lobar consolidation. Surgical clips project over the right hemi thorax. IMPRESSION: COPD/chronic bronchitis.  No acute superimposed process. Electronically Signed   By: Abigail Miyamoto M.D.   On: 07/07/2016 16:37        Scheduled Meds: . amitriptyline  50 mg Oral QHS  . amLODipine  5 mg Oral Daily  . aspirin EC  81 mg Oral Daily  . atorvastatin  80 mg Oral Daily  . budesonide (PULMICORT) nebulizer solution  0.5 mg Nebulization BID  . buPROPion  150 mg Oral BID  . cilostazol  100 mg Oral BID  . enoxaparin (LOVENOX) injection  40 mg Subcutaneous Q24H  . fluticasone  2 spray Each Nare Daily  . guaiFENesin  1,200 mg Oral BID  . ipratropium-albuterol  3 mL Nebulization Q4H  . levothyroxine  112 mcg Oral QAC breakfast  . loratadine  10 mg Oral Daily  . metoprolol tartrate  25 mg Oral BID  . nystatin  5 mL Oral QID  . pantoprazole  40 mg Oral Daily  . predniSONE  50 mg Oral Q breakfast   Continuous  Infusions:    LOS: 6 days    Time spent: 35 minutes    THOMPSON,DANIEL, MD [facility] Pager [phone]  If 7PM-7AM, please contact night-coverage [email or web address] Password [email or web address] 07/09/2016, 1:57 PM

## 2016-07-09 NOTE — Care Management Important Message (Signed)
Important Message  Patient Details  Name: [name] MRN: [id number] Date of Birth: 1946/09/08   Medicare Important Message Given:  Yes    Johnhenry Tippin [address] 07/09/2016, 1:35 PM

## 2016-07-09 NOTE — Progress Notes (Signed)
PULMONARY / CRITICAL CARE MEDICINE   Name: [name] MRN: [id number] DOB: 1946-07-05    ADMISSION DATE:  07/02/2016 CONSULTATION DATE:  07/06/2016  REFERRING MD:  Dr. Grandville Silos, [facility]  CHIEF COMPLAINT:  Short of breath  BRIEF SUMMARY:  70 yo female smoker presented with dyspnea, cough, wheezing, hypoxia with SpO2 in 80s at home.  Chest xray was negative for infiltrate or effusion.  She had mildly elevated eosinophil count on CBD with differential.  She was admitted with COPD exacerbation.  She was started on solumedrol, levaquin, and BDs.  She continued to have difficulty with her breathing and PCCM consulted.  She was treated for pneumonia in July.  She was given antibiotics and prednisone.  She felt well for few weeks, but then started having trouble breathing again.  She has not smoked cigarettes for past few months.  She denies sick exposures.  She has noticed sinus congestion and sore throat.  She denies fever, skin rash, leg swelling, abdominal pain, or diarrhea.   SUBJECTIVE:  Pt reports ongoing SOB, wheezing.  Remains on 4L.  Afebrile.     VITAL SIGNS: BP 127/83 (BP Location: Left Arm)   Pulse 77   Temp 97.9 F (36.6 C) (Oral)   Resp 17   Ht '5\' 6"'$  (1.676 m)   Wt 140 lb (63.5 kg)   SpO2 92%   BMI 22.60 kg/m   INTAKE / OUTPUT: I/O last 3 completed shifts: In: 600 [P.O.:600] Out: 2175 [Urine:1225; Stool:950]  PHYSICAL EXAMINATION: General: adult female in NAD Neuro:  Alert, normal strength, CN intact HEENT:  No sinus tenderness, no oral exudate, no LAN Cardiovascular:  Regular, no murmur Lungs:  Barrel chest, non-labored, lungs bilaterally coarse with rhonchi Abdomen:  Soft, non tender Musculoskeletal:  No edema Skin:  No rashes  LABS:  PULMONARY  Recent Labs Lab 07/07/16 1610  PHART 7.442  PCO2ART 42.2  PO2ART 71.0*  HCO3 28.4*  O2SAT 94.3    CBC  Recent Labs Lab 07/03/16 0256 07/04/16 0526 07/05/16 0323  HGB 14.5 14.8 14.4  HCT 43.8 45.2  43.7  WBC 7.0 6.6 9.7  PLT 354 381 401*    CHEMISTRY  Recent Labs Lab 07/03/16 0256 07/04/16 0526 07/05/16 0323 07/07/16 0513 07/08/16 0121 07/09/16 0556  NA 139 137 139 136 136  --   K 4.2 4.4 4.2 4.0 4.4  --   CL 105 103 105 101 97*  --   CO2 '27 26 26 28 31  '$ --   GLUCOSE 110* 133* 124* 132* 141*  --   BUN '11 16 20 19 19  '$ --   CREATININE 0.79 0.85 0.84 0.73 0.74  --   CALCIUM 9.5 9.7 9.8 9.4 8.9  --   MG  --   --   --   --   --  2.1  PHOS  --   --   --   --   --  3.7   Estimated Creatinine Clearance: 60.4 mL/min (by C-G formula based on SCr of 0.74 mg/dL).   IMAGING x48h  - image(s) personally visualized  -   highlighted in bold Dg Chest Port 1 View  Result Date: 07/07/2016 CLINICAL DATA:  Acute on chronic respiratory failure with hypoxia, recent pneumonia. Hx of HTN. EXAM: PORTABLE CHEST 1 VIEW COMPARISON:  07/02/2016 FINDINGS: Midline trachea. Normal heart size. Atherosclerosis in the transverse aorta. No pleural effusion or pneumothorax. Hyperinflation. Diffuse peribronchial thickening. Biapical pleural-parenchymal scarring. No lobar consolidation. Surgical clips project over the  right hemi thorax. IMPRESSION: COPD/chronic bronchitis.  No acute superimposed process. Electronically Signed   By: Abigail Miyamoto M.D.   On: 07/07/2016 16:37    STUDIES:  PFT 03/28/11 >> FEV1 1.61 (70%), FEV1% 48, TLC 6.03 (115%), DLCO 69%, +BD ECHO 9/25 >>   CULTURES:  ANTIBIOTICS: Levaquin 9/18 >>   SIGNIFICANT EVENTS: 9/18  Admit 9/23  Feels like flutter valve and chest vest help but overall not better since admit 9/18/, O2 need new since admit but not worse. Gets dyspneic talking and walking. Feels exhausted.  9/24  Bipap qhs. + increased neb frequency, ECHO pending, cxr/ABG ok. Feels better, thinks bipap QHS made the difference   DISCUSSION: 70 y/o F,smoker, with PMH of COPD/emphysema admitted 9/18 with dyspnea, cough, wheeze from COPD exacerbation.  ASSESSMENT / PLAN:  Acute  hypoxic respiratory failure - in the setting of AECOPD, slow to resolve Acute COPD exacerbation. 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  Please call if we can be of further assistance.   Noe Gens, NP-C [facility] Pgr: [phone] or if no answer [phone] 07/09/2016, 11:49 AM  Attending Note:  70 year old female with COPD history presenting to PCCM with respiratory failure due to AE-COPD.  Patient is slowly improving.  On exam, continues to wheeze specially at the upper lobes.  I reviewed CXR myself, hyperinflation noted.  Discussed with PCCM-NP.  AE-COPD:  - Prednisone 50 mg daily and taper slowly over 2 wks.  - Pulmicort  - Albuterol/atrovent scheduled.  - PRN albuterol.  Hypoxemia:  - Titrate O2 for sat of 88-92%.   - Will need an ambulatory desat study prior to discharge as I anticipate will need home O2.  Bronchitis:  - Levaquin course complete, monitor clinically.  PCCM will sign off, please call back if needed.  Patient seen and examined, agree with above note.  I dictated the care and orders written for this patient under my direction.  Rush Farmer, MD [phone]

## 2016-07-09 NOTE — Progress Notes (Signed)
SATURATION QUALIFICATIONS: (This note is used to comply with regulatory documentation for home oxygen)  Patient Saturations on 4 Liters of O2 at rest 92%  Patient Saturations on 4 Liters of oxygen while Ambulating = 85%  Please briefly explain why patient needs home oxygen: Patient's sats 85% on 4L while ambulating

## 2016-07-09 NOTE — Progress Notes (Addendum)
Occupational Therapy Treatment Patient Details Name: LEZLY RUMPF MRN: [id number] DOB: [birth year] Today's Date: 07/09/2016    History of present illness Pt admitted with COPD exacerbation. PMH: breast cancer, fibromyalgia, PVD, HTN, GERD, hypothyroidism.   OT comments  Pt. Provided with energy conservation examples and tips to ensure she is able to safely maintain independence with ADLS.  Clear for d/c from acute OT.  Will alert OTR/L to sign off.    Follow Up Recommendations  No OT follow up    Equipment Recommendations  None recommended by OT    Recommendations for Other Services      Precautions / Restrictions                                               ADL                                         General ADL Comments: reviewed energy conservation techniques.  pt. able to name 1. rest breaks 2. breathing techniques 3. delegating and asking for family assistance from her sons and husband.  i also reviewed allowing proper amounts of time for b/d as this often requires a lot of energy.  also discussed sitting for tasks when able.  ie: drying hair, applying make up, and meal prep.  provided additional example of list making and placing certain tasks for each day to better distrubute her work load and prevent her from doing too many things each day.  pt. was very excited and stated "this actually answered all of my questions i was gonna ask about".  clear for d/c from OT.        Vision                     Perception     Praxis      Cognition   Behavior During Therapy: WFL for tasks assessed/performed Overall Cognitive Status: Within Functional Limits for tasks assessed                       Extremity/Trunk Assessment               Exercises     Shoulder Instructions       General Comments      Pertinent Vitals/ Pain       Pain Assessment: No/denies pain  Home Living                                           Prior Functioning/Environment              Frequency  Min 2X/week        Progress Toward Goals  OT Goals(current goals can now be found in the care plan section)  Progress towards OT goals: Goals met/education completed, patient discharged from [place] Discharge plan remains appropriate    Co-evaluation                 End of Session     Activity Tolerance Patient tolerated treatment well   Patient Left in chair;with call bell/phone within reach  Nurse Communication          Time: 1791-5056 OT Time Calculation (min): 15 min  Charges: OT General Charges $OT Visit: 1 Procedure OT Treatments $Self Care/Home Management : 8-22 mins  Janice Coffin, COTA/L 07/09/2016, 2:38 PM

## 2016-07-09 NOTE — Progress Notes (Signed)
  Echocardiogram 2D Echocardiogram has been performed.  Diamond Nickel 07/09/2016, 3:51 PM

## 2016-07-10 DIAGNOSIS — H9193 Unspecified hearing loss, bilateral: Secondary | ICD-10-CM

## 2016-07-10 LAB — CBC
HCT: 44.9 % (ref 36.0–46.0)
Hemoglobin: 14.5 g/dL (ref 12.0–15.0)
MCH: 30.1 pg (ref 26.0–34.0)
MCHC: 32.3 g/dL (ref 30.0–36.0)
MCV: 93.3 fL (ref 78.0–100.0)
Platelets: 413 10*3/uL — ABNORMAL HIGH (ref 150–400)
RBC: 4.81 MIL/uL (ref 3.87–5.11)
RDW: 15.3 % (ref 11.5–15.5)
WBC: 8.8 10*3/uL (ref 4.0–10.5)

## 2016-07-10 LAB — BASIC METABOLIC PANEL
Anion gap: 7 (ref 5–15)
BUN: 19 mg/dL (ref 6–20)
CO2: 33 mmol/L — ABNORMAL HIGH (ref 22–32)
Calcium: 9 mg/dL (ref 8.9–10.3)
Chloride: 100 mmol/L — ABNORMAL LOW (ref 101–111)
Creatinine, Ser: 0.86 mg/dL (ref 0.44–1.00)
GFR calc Af Amer: >60 mL/min (ref >60)
GFR calc non Af Amer: >60 mL/min (ref >60)
Glucose, Bld: 109 mg/dL — ABNORMAL HIGH (ref 65–99)
Potassium: 3.5 mmol/L (ref 3.5–5.1)
Sodium: 140 mmol/L (ref 135–145)

## 2016-07-10 LAB — PHOSPHORUS: Phosphorus: 3.5 mg/dL (ref 2.5–4.6)

## 2016-07-10 LAB — MAGNESIUM: Magnesium: 2.1 mg/dL (ref 1.7–2.4)

## 2016-07-10 MED ORDER — PREDNISONE 10 MG PO TABS: 10.0000 mg | ORAL_TABLET | Freq: Every day | ORAL | 0 refills | Status: DC

## 2016-07-10 MED ORDER — POTASSIUM CHLORIDE CRYS ER 20 MEQ PO TBCR
40.0000 meq | EXTENDED_RELEASE_TABLET | Freq: Once | ORAL | Status: AC
  Administered 2016-07-10: 40 meq via ORAL
  Filled 2016-07-10: qty 2

## 2016-07-10 MED ORDER — IPRATROPIUM-ALBUTEROL 0.5-2.5 (3) MG/3ML IN SOLN: 3.0000 mL | Freq: Four times a day (QID) | RESPIRATORY_TRACT | 2 refills | Status: DC | PRN

## 2016-07-10 MED ORDER — FLUTICASONE PROPIONATE 50 MCG/ACT NA SUSP: 2.0000 | Freq: Every day | NASAL | 0 refills | Status: DC

## 2016-07-10 NOTE — Care Management Note (Signed)
Case Management Note  Patient Details  Name: [name] MRN: [id number] Date of Birth: [birth year]  Subjective/Objective:                    Action/Plan:   Expected Discharge Date:                  Expected Discharge Plan:  Home/Self Care  In-House Referral:     Discharge planning Services     Post Acute Care Choice:    Choice offered to:  Patient, Spouse  DME Arranged:  Oxygen DME Agency:  [place]:    [facility] Agency:     Status of Service:  Completed, signed off  If discussed at [place] of Stay Meetings, dates discussed:    Additional Comments:  Marilu Favre, RN 07/10/2016, 3:18 PM

## 2016-07-10 NOTE — Progress Notes (Signed)
[name] to be D/C'd  per MD order. Discussed with the patient and all questions fully answered.  VSS, Skin clean, dry and intact without evidence of skin break down, no evidence of skin tears noted.  IV catheter discontinued intact. Site without signs and symptoms of complications. Dressing and pressure applied.  An After Visit Summary was printed and given to the patient. Patient received prescription.  D/c education completed with patient/family including follow up instructions, medication list, d/c activities limitations if indicated, with other d/c instructions as indicated by MD - patient able to verbalize understanding, all questions fully answered.   Patient instructed to return to ED, call 911, or call MD for any changes in condition.   Patient to be escorted via [place], and D/C home via private auto.

## 2016-07-10 NOTE — Discharge Summary (Signed)
Physician Discharge Summary  [name] IRJ:[id number] DOB: [birth year] DOA: 07/02/2016  PCP: Hoyt Koch, MD  Admit date: 07/02/2016 Discharge date: 07/10/2016  Time spent: 65 minutes  Recommendations for Outpatient Follow-up:  1. Follow-up with Hoyt Koch, MD in 1-2 weeks. On follow-up patient's respiratory status and need to be reassessed. 2. Follow-up with Dr. Chase Caller 08/09/2016, for follow-up severe COPD. Patient will likely need pulmonary function studies done as well. 3.    Discharge Diagnoses:  Principal Problem:   Acute on chronic respiratory failure with hypoxia (HCC) Active Problems:   COPD exacerbation (HCC)   Malignant neoplasm of female breast (HCC)   Hypothyroidism   Hyperlipidemia   Hearing loss   Essential hypertension   COPD (chronic obstructive pulmonary disease) (HCC)   GERD   Arthropathy   PAD (peripheral artery disease) (HCC)   Tobacco abuse   Chronic mesenteric ischemia (HCC)   CAD (coronary artery disease)   HLD (hyperlipidemia)   Thyroid activity decreased   Discharge Condition: Stable and improved  Diet recommendation: Heart healthy  Filed Weights   07/02/16 1120  Weight: 63.5 kg (140 lb)    History of present illness:  Per Dr Rachael Fee [name] is a 70 y.o. female with medical history significant  R breast cancer on remission, COPD, history of RUL lung masss/p Wedge resection 1993, presented with progressive onset of dyspnea, at least present for 5 weeks, worse over the last 3-4 days, associated with lower chest tightness, anteriorly, mild, non radiating. Denied any fever or chils or night sweats, no recnet sick contacts or travels.Currently not smoking, discontinuing tobacco 2 months ago. She is compliant with her pulmonary meds. Denied any headaches or vision changes. No cardiac complaints. No abdominal pain. No lower extremity swelling. No new changes in her COPD meds.    Hospital Course:  #1 acute respiratory  failure with hypoxia secondary to acute COPD exacerbation Patient Was admitted with acute respiratory failure with hypoxia felt to be secondary to an acute COPD exacerbation. Patient was placed on IV steroids, antibiotics, scheduled nebulizer treatments, Mucinex. Pulmicort and Brovana was subsequently added to patient's regimen. Patient continued to have significant shortness of breath with minimal exertion with no significant improvement and a such pulmonary consultation was obtained. Patient was subsequently placed on the BiPAP at bedtime and nebulizer changes made per pulmonary recommendations. Flutter valve was also placed. 2-D echo was ordered which had a EF of 55-60% with grade 1 diastolic dysfunction and mildly thickened mitral valvular leaflets. Patient started to improve clinically after being placed on BiPAP at the bed time. IV steroids was subsequently transitioned to oral Levaquin and patient completed a full course of antibiotic treatment. Patient was also maintained on scheduled albuterol and Atrovent. Patient improved clinically and be discharged home on home regimen of Advair and Spiriva as well as a nebulizers as needed. Patient was also noted to desaturate on ambulation on room and a such was sent home on home oxygen. Patient improved clinically and be discharged in stable and improved condition with follow-up with pulmonary and PCP. Patient will be discharged on a steroid slow taper over the next 2 weeks.   #2 hypothyroidism Continued on home regimen of Synthroid.  #3 hypertension Stable. Continued on home regimen of Lopressor, Norvasc.  #4 hyperlipidemia Continued on statin.  #5 gastroesophageal reflux disease  PPI.  #6 peripheral vascular disease/CAD Continued on home regimen of aspirin, Pletal, statin, beta blockers, calcium channel blockers.  #7 tobacco abuse Per admitting  physician patient recently quit tobacco use.   #8 history of breast cancer in  remission Stable. Outpatient follow-up.     Procedures:  Chest x-ray 07/03/2016, 07/02/2016  2-D echo 07/09/2016  Consultations:  Pulmonary: Dr. Halford Chessman 07/06/2016  Discharge Exam: Vitals:   07/10/16 0449 07/10/16 1429  BP: 126/86 112/68  Pulse: 88 81  Resp: 20 18  Temp: 98 F (36.7 C)     General: NAD Cardiovascular: RRR Respiratory: Min- mild expiratory wheezing. No crackles.  Discharge Instructions   Discharge Instructions    Diet - low sodium heart healthy    Complete by:  As directed    Increase activity slowly    Complete by:  As directed      Current Discharge Medication List    START taking these medications   Details  fluticasone (FLONASE) 50 MCG/ACT nasal spray Place 2 sprays into both nostrils daily. Qty: 16 g, Refills: 0    predniSONE (DELTASONE) 10 MG tablet Take 1-5 tablets (10-50 mg total) by mouth daily with breakfast. Take 5 tablets ('50mg'$ ) daily x 3 days, then 4 tablets('40mg'$ ) daily x 3 days, then 3 tablets ('30mg'$ ) daily x 3 days, then 2 tablets ('20mg'$ ) daily x 3 days, then 1 tablet ('10mg'$ ) daily x 3 days then stop. Qty: 45 tablet, Refills: 0      CONTINUE these medications which have CHANGED   Details  ipratropium-albuterol (DUONEB) 0.5-2.5 (3) MG/3ML SOLN Take 3 mLs by nebulization every 6 (six) hours as needed. Use 3 times daily x 3 days, then every 6 hours as needed. Qty: 360 mL, Refills: 2      CONTINUE these medications which have NOT CHANGED   Details  albuterol (PROVENTIL HFA;VENTOLIN HFA) 108 (90 Base) MCG/ACT inhaler Inhale 2 puffs into the lungs every 4 (four) hours as needed for wheezing. As a RESCUE medication Qty: 1 Inhaler, Refills: 1    amitriptyline (ELAVIL) 50 MG tablet Take 1 tablet (50 mg total) by mouth at bedtime. Qty: 90 tablet, Refills: 3    amLODipine (NORVASC) 5 MG tablet Take 1 tablet (5 mg total) by mouth daily. Qty: 90 tablet, Refills: 3    aspirin 81 MG tablet Take 81 mg by mouth every evening.      atorvastatin (LIPITOR) 80 MG tablet Take 1 tablet (80 mg total) by mouth daily. Please call and schedule appt for further refills - [phone] - 1st attempt Qty: 30 tablet, Refills: 0    B Complex Vitamins (VITAMIN-B COMPLEX PO) Take 1 tablet by mouth daily.     bisacodyl (DULCOLAX) 5 MG EC tablet Take 1 tablet (5 mg total) by mouth daily as needed for moderate constipation. Qty: 30 tablet, Refills: 11    buPROPion (WELLBUTRIN SR) 150 MG 12 hr tablet Take 1 tablet (150 mg total) by mouth 2 (two) times daily. Qty: 60 tablet, Refills: 3    Calcium Carbonate-Vitamin D (CALCIUM + D PO) Take 600 mg by mouth daily.     cilostazol (PLETAL) 100 MG tablet Take 1 tablet (100 mg total) by mouth 2 (two) times daily. Please call and schedule a one year follow up appointment for further refills Qty: 180 tablet, Refills: 0   Associated Diagnoses: Peripheral vascular disease ([place])    dextromethorphan-guaiFENesin (MUCINEX DM) 30-600 MG 12hr tablet Take 1 tablet by mouth 2 (two) times daily.    Fluticasone-Salmeterol (ADVAIR DISKUS) 100-50 MCG/DOSE AEPB Inhale 1 puff into the lungs 2 (two) times daily. Qty: 180 each, Refills: 1   Associated Diagnoses:  Pulmonary emphysema, unspecified emphysema type (HCC)    levothyroxine (SYNTHROID, LEVOTHROID) 112 MCG tablet TAKE ONE TABLET BY MOUTH ONE TIME DAILY Qty: 90 tablet, Refills: 3    LORazepam (ATIVAN) 2 MG tablet TAKE 1 TABLET BY MOUTH AT BEDTIME AS NEEDED FOR ANXIETY Qty: 30 tablet, Refills: 2    metoprolol tartrate (LOPRESSOR) 25 MG tablet Take 1 tablet (25 mg total) by mouth 2 (two) times daily. Qty: 180 tablet, Refills: 3    omeprazole (PRILOSEC) 40 MG capsule Take 1 capsule (40 mg total) by mouth 2 (two) times daily. Qty: 180 capsule, Refills: 3    tiotropium (SPIRIVA HANDIHALER) 18 MCG inhalation capsule Place 1 capsule (18 mcg total) into inhaler and inhale daily. Qty: 30 capsule, Refills: 11   Associated Diagnoses: Pulmonary emphysema,  unspecified emphysema type ([place])      STOP taking these medications     guaiFENesin-dextromethorphan (ROBITUSSIN DM) 100-10 MG/5ML syrup        Allergies  Allergen Reactions  . Sulfonamide Derivatives Swelling   Follow-up Information    RAMASWAMY,MURALI, MD Follow up on 08/09/2016.   Specialty:  Pulmonary Disease Why:  Appt at 9:45 AM  Contact information: [place] [address] [phone]        Hoyt Koch, MD. Schedule an appointment as soon as possible for a visit in 2 week(s).   Specialty:  Internal Medicine Why:  f/u in 1-2 weeks. Contact information: [place] [address] [phone]            The results of significant diagnostics from this hospitalization (including imaging, microbiology, ancillary and laboratory) are listed below for reference.    Significant Diagnostic Studies: X-ray Chest Pa And Lateral  Result Date: 07/03/2016 CLINICAL DATA:  Shortness of breath, cough, congestion, COPD EXAM: CHEST  2 VIEW COMPARISON:  07/02/2016 FINDINGS: Postsurgical changes in the right hemithorax/ right upper lobe. Chronic interstitial markings/emphysematous changes. No focal consolidation. No pleural effusion or pneumothorax. Postsurgical changes overlying the right hemithorax. The heart is normal in size. Mild degenerative changes of the visualized thoracolumbar spine. IMPRESSION: No evidence of acute cardiopulmonary disease. Electronically Signed   By: Julian Hy M.D.   On: 07/03/2016 10:13   Dg Chest 2 View  Result Date: 07/02/2016 CLINICAL DATA:  Short of breath EXAM: CHEST  2 VIEW COMPARISON:  06/06/2016 FINDINGS: Advanced COPD with hyperinflation and emphysema. Postop resection right upper lobe. Continued improvement in left upper lobe infiltrate which has nearly completely cleared. No underlying mass or adenopathy. No pleural effusion. Heart size and vascularity normal. IMPRESSION: Advanced COPD.  No acute  abnormality. Electronically Signed   By: Franchot Gallo M.D.   On: 07/02/2016 12:07   Dg Chest Port 1 View  Result Date: 07/07/2016 CLINICAL DATA:  Acute on chronic respiratory failure with hypoxia, recent pneumonia. Hx of HTN. EXAM: PORTABLE CHEST 1 VIEW COMPARISON:  07/02/2016 FINDINGS: Midline trachea. Normal heart size. Atherosclerosis in the transverse aorta. No pleural effusion or pneumothorax. Hyperinflation. Diffuse peribronchial thickening. Biapical pleural-parenchymal scarring. No lobar consolidation. Surgical clips project over the right hemi thorax. IMPRESSION: COPD/chronic bronchitis.  No acute superimposed process. Electronically Signed   By: Abigail Miyamoto M.D.   On: 07/07/2016 16:37    Microbiology: No results found for this or any previous visit (from the past 240 hour(s)).   Labs: Basic Metabolic Panel:  Recent Labs Lab 07/04/16 0526 07/05/16 0323 07/07/16 0513 07/08/16 0121 07/09/16 0556 07/10/16 0513  NA 137 139 136  136  --  140  K 4.4 4.2 4.0 4.4  --  3.5  CL 103 105 101 97*  --  100*  CO2 '26 26 28 31  '$ --  33*  GLUCOSE 133* 124* 132* 141*  --  109*  BUN '16 20 19 19  '$ --  19  CREATININE 0.85 0.84 0.73 0.74  --  0.86  CALCIUM 9.7 9.8 9.4 8.9  --  9.0  MG  --   --   --   --  2.1 2.1  PHOS  --   --   --   --  3.7 3.5   Liver Function Tests: No results for input(s): AST, ALT, ALKPHOS, BILITOT, PROT, ALBUMIN in the last 168 hours. No results for input(s): LIPASE, AMYLASE in the last 168 hours. No results for input(s): AMMONIA in the last 168 hours. CBC:  Recent Labs Lab 07/04/16 0526 07/05/16 0323 07/10/16 0513  WBC 6.6 9.7 8.8  HGB 14.8 14.4 14.5  HCT 45.2 43.7 44.9  MCV 94.2 93.8 93.3  PLT 381 401* 413*   Cardiac Enzymes: No results for input(s): CKTOTAL, CKMB, CKMBINDEX, TROPONINI in the last 168 hours. BNP: BNP (last 3 results)  Recent Labs  07/02/16 1235  BNP 28.4    ProBNP (last 3 results) No results for input(s): PROBNP in the last 8760  hours.  CBG: No results for input(s): GLUCAP in the last 168 hours.     SignedIrine Seal MD.  [facility] 07/10/2016, 4:23 PM

## 2016-07-16 ENCOUNTER — Ambulatory Visit (INDEPENDENT_AMBULATORY_CARE_PROVIDER_SITE_OTHER): Payer: Medicare Other | Admitting: Internal Medicine

## 2016-07-16 ENCOUNTER — Encounter: Payer: Self-pay | Admitting: Internal Medicine

## 2016-07-16 DIAGNOSIS — J189 Pneumonia, unspecified organism: Secondary | ICD-10-CM

## 2016-07-16 DIAGNOSIS — J432 Centrilobular emphysema: Secondary | ICD-10-CM | POA: Diagnosis not present

## 2016-07-16 DIAGNOSIS — Z720 Tobacco use: Secondary | ICD-10-CM

## 2016-07-16 DIAGNOSIS — J9621 Acute and chronic respiratory failure with hypoxia: Secondary | ICD-10-CM

## 2016-07-16 NOTE — Assessment & Plan Note (Signed)
Still taking prednisone, will decrease O2 to 2 L at this time due to improving lung function. She will follow up with pulmonology later this month and will need to try off oxygen with 5 minute walk as well as chest x-ray for follow up of her pneumonia. She is still not smoking which is wonderful.

## 2016-07-16 NOTE — Patient Instructions (Signed)
We are going to have you go down to 2 liters of oxygen instead of 4.   When you see the lung doctor in a couple of weeks make sure to ask them to check you while walking off the oxygen to see if you still need it.   I am hopeful that you can get off the oxygen. Keep up the great work to stop smoking!

## 2016-07-16 NOTE — Progress Notes (Signed)
Pre visit review using our clinic review tool, if applicable. No additional management support is needed unless otherwise documented below in the visit note. 

## 2016-07-16 NOTE — Assessment & Plan Note (Addendum)
Needing oxygen still. We have decreased her to 2L/minute from 4 in the office today with good response. Original O2 sat after walking on 4L 98% and after switch to 2L still 98% (at rest, no time to trial off O2 with walking)

## 2016-07-16 NOTE — Assessment & Plan Note (Signed)
Is stopped now for several months and is using wellbutrin which has helped her well.

## 2016-07-16 NOTE — Assessment & Plan Note (Signed)
This spurred COPD exacerbation and she is now on oxygen and following up with pulmonary later this month for chest x-ray.

## 2016-07-16 NOTE — Progress Notes (Signed)
   Subjective:    Patient ID: [name], female    DOB: 16-Mar-1946, 70 y.o.   MRN: [id number]  HPI The patient is a 70 YO female coming in for hospital follow up (discharged <1 week ago with CAP and COPD exacerbation and treated with extended IV steroids and antibiotics and discharged home with O2). She is doing better but not well now. She is not doing much activity at home per the advice of the doctors in the hospital. She is still taking the prednisone. No antibiotics as finished while in the hospital. She was discharged with oxygen and is wanting to get off this eventually. She is still not smoking and taking the wellbutrin which was helped greatly. Denies chest pains or SOB. Some SOB still and mild wheezing. No diarrhea or constipation. No sinus problems.  She is wearing 4 L oxygen all the time and we turned down to 2L in the office without desaturation.   PMH, [facility], social history reviewed and updated.   Review of Systems  Constitutional: Positive for activity change and fatigue. Negative for fever and unexpected weight change.  HENT: Negative.   Eyes: Negative.   Respiratory: Positive for shortness of breath and wheezing. Negative for cough and chest tightness.        Improving  Cardiovascular: Negative for chest pain, palpitations and leg swelling.  Gastrointestinal: Negative for abdominal distention, abdominal pain, constipation, diarrhea and vomiting.  Musculoskeletal: Negative for back pain, gait problem and myalgias.  Skin: Negative.   Neurological: Negative for dizziness, weakness, light-headedness and headaches.  Psychiatric/Behavioral: Negative.  Negative for sleep disturbance.      Objective:   Physical Exam  Constitutional: She is oriented to person, place, and time. She appears well-developed and well-nourished.  HENT:  Head: Normocephalic and atraumatic.  Right Ear: External ear normal.  Left Ear: External ear normal.  Eyes: EOM are normal.  Neck: No JVD present.    Cardiovascular: Normal rate and regular rhythm.   Pulmonary/Chest: Effort normal. No respiratory distress. She has wheezes. She has no rales. She exhibits no tenderness.  Mild wheezing left lung, good air flow, wearing 4 L oxygen in the office.   Abdominal: Soft. Bowel sounds are normal. She exhibits no distension. There is no tenderness. There is no rebound.  Musculoskeletal: She exhibits no edema.  Lymphadenopathy:    She has no cervical adenopathy.  Neurological: She is alert and oriented to person, place, and time. Coordination normal.  Skin: Skin is warm and dry.   Vitals:   07/16/16 1409  BP: 96/62  Pulse: 76  Resp: 16  Temp: 98.2 F (36.8 C)  TempSrc: Oral  SpO2: 98%  Weight: 140 lb 4.8 oz (63.6 kg)  Height: '5\' 6"'$  (1.676 m)      Assessment & Plan:

## 2016-07-26 ENCOUNTER — Other Ambulatory Visit: Payer: Self-pay | Admitting: Cardiology

## 2016-07-28 ENCOUNTER — Other Ambulatory Visit: Payer: Self-pay | Admitting: Internal Medicine

## 2016-08-03 ENCOUNTER — Other Ambulatory Visit: Payer: Self-pay | Admitting: Internal Medicine

## 2016-08-03 NOTE — Telephone Encounter (Signed)
Amy can you send this in for this patient today?

## 2016-08-05 ENCOUNTER — Other Ambulatory Visit: Payer: Self-pay | Admitting: Internal Medicine

## 2016-08-06 ENCOUNTER — Other Ambulatory Visit: Payer: Self-pay | Admitting: *Deleted

## 2016-08-06 MED ORDER — BUPROPION HCL ER (SR) 150 MG PO TB12: 150.0000 mg | ORAL_TABLET | Freq: Two times a day (BID) | ORAL | 1 refills | Status: DC

## 2016-08-09 ENCOUNTER — Ambulatory Visit (INDEPENDENT_AMBULATORY_CARE_PROVIDER_SITE_OTHER): Payer: Medicare Other | Admitting: Internal Medicine

## 2016-08-09 ENCOUNTER — Encounter: Payer: Self-pay | Admitting: Internal Medicine

## 2016-08-09 VITALS — BP 118/76 | HR 75 | Ht 66.0 in | Wt 146.0 lb

## 2016-08-09 DIAGNOSIS — J441 Chronic obstructive pulmonary disease with (acute) exacerbation: Secondary | ICD-10-CM

## 2016-08-09 DIAGNOSIS — Z8701 Personal history of pneumonia (recurrent): Secondary | ICD-10-CM | POA: Diagnosis not present

## 2016-08-09 DIAGNOSIS — J449 Chronic obstructive pulmonary disease, unspecified: Secondary | ICD-10-CM | POA: Diagnosis not present

## 2016-08-09 NOTE — Progress Notes (Signed)
Subjective:     Patient ID: [name], female   DOB: 1946/06/02, 70 y.o.   MRN: [id number]  HPI   OV 08/09/2016  Chief Complaint  Patient presents with  . Follow-up    Here for HFU for COPD. Pt was started on 3lpm while at hospital. Pt denies SOB while on O2 and denies cough and CP/tightness and f/c/s.    70 year old female with COPD not otherwise specified. Had left upper lobe pneumonia admission in July 2017. Followed by CVG exacerbation in September 2017 that was significantly severe requiring BiPAP therapy. She only improved with BiPAP therapy. She required pulmonary consultation. Chest x-ray visualized was clear at this admission. She now presents with a husband for follow-up. She is on triple medication therapy via nebulizer and inhaler. She is feeling great and overall improved. Walking desaturation test 185 feet 3 laps on room air after being on Coumadin for 10 minutes: Did not desaturate and she walked pretty briskly without any problems. She is asking about nocturnal BiPAP but at the same time she is feeling well. She denies any home desaturations below 94%. There are no other new complaints.  Of note she's never had pulmonary function testing in the past      has a past medical history of Cancer ([place]) (2010); Complication of anesthesia; COPD (chronic obstructive pulmonary disease) ([place]); Diverticulitis; Emphysema of lung ([place]); Fibromyalgia (1995); GERD (gastroesophageal reflux disease); Hearing loss of both ears; colonic polyps; Hyperlipidemia; Hypertension; Hypothyroidism; Osteoporosis; Peripheral vascular disease [facility]) (May '12 - CT angio); Shortness of breath dyspnea; Varicose veins of leg with swelling; and Wears glasses.   reports that she quit smoking about 4 months ago. Her smoking use included Cigarettes. She has a 37.50 pack-year smoking history. She has never used smokeless tobacco.  Past Surgical History:  Procedure Laterality Date  . ABDOMINAL HYSTERECTOMY  1973    metorrhagia  . APPENDECTOMY    . BREAST SURGERY  2010   mastectomy with reconstructive surgery  . BREAST SURGERY  2010   rt lump x2  . BREAST SURGERY  2010   partial rt br reduction  . CARDIAC CATHETERIZATION  '11   no obstructive coronary disease  . COLECTOMY  2002   sigmoid  . COLONOSCOPY W/ POLYPECTOMY    . LUNG SURGERY  9/12   rt mid lung wedge  . PARTIAL MASTECTOMY WITH NEEDLE LOCALIZATION Left 02/23/2013   Procedure:  LEFT PARTIAL MASTECTOMY WITH NEEDLE LOCALIZATION;  Surgeon: Adin Hector, MD;  Location: [facility];  Service: General;  Laterality: Left;  . [place]   right upper lobe of lung as part of VAT-nodule/benign    Allergies  Allergen Reactions  . Sulfonamide Derivatives Swelling    Immunization History  Administered Date(s) Administered  . Influenza,inj,Quad PF,36+ Mos 08/10/2014, 08/12/2015, 07/03/2016  . Pneumococcal Conjugate-13 08/10/2014  . Pneumococcal Polysaccharide-23 08/12/2015  . Td 10/16/2007  . Zoster 11/09/2010    Family History  Problem Relation Age of Onset  . Colon cancer Mother     colon  . COPD Mother     brown lung  . Hypertension Mother   . Diabetes Mother   . Emphysema Mother   . Coronary artery disease Father   . Heart attack Father   . Sudden death Father   . Heart disease Father   . Cancer Sister     fallopian tube  . Hypertension Sister   . Coronary artery disease Brother   . Throat  cancer Sister   . Melanoma Sister   . Hypertension Sister   . Pancreatic cancer Sister   . Cancer Sister   . Heart attack Brother     early 22's  . Heart failure Sister      Current Outpatient Prescriptions:  .  albuterol (PROVENTIL HFA;VENTOLIN HFA) 108 (90 Base) MCG/ACT inhaler, Inhale 2 puffs into the lungs every 4 (four) hours as needed for wheezing. As a RESCUE medication, Disp: 1 Inhaler, Rfl: 1 .  amitriptyline (ELAVIL) 50 MG tablet, TAKE 1 TABLET (50 MG TOTAL) BY MOUTH AT BEDTIME., Disp: 90  tablet, Rfl: 3 .  amLODipine (NORVASC) 5 MG tablet, Take 1 tablet (5 mg total) by mouth daily. (Patient taking differently: Take 5 mg by mouth every evening. ), Disp: 90 tablet, Rfl: 3 .  aspirin 81 MG tablet, Take 81 mg by mouth every evening. , Disp: , Rfl:  .  atorvastatin (LIPITOR) 80 MG tablet, TAKE 1 TABLET BY MOUTH DAILY CALL AND SCHEDULE APPT FOR FURTHER REFILLS - [id number], 1ST ATTEMPT, Disp: 30 tablet, Rfl: 0 .  B Complex Vitamins (VITAMIN-B COMPLEX PO), Take 1 tablet by mouth daily. , Disp: , Rfl:  .  bisacodyl (DULCOLAX) 5 MG EC tablet, Take 1 tablet (5 mg total) by mouth daily as needed for moderate constipation. (Patient taking differently: Take 5 mg by mouth daily. ), Disp: 30 tablet, Rfl: 11 .  buPROPion (WELLBUTRIN SR) 150 MG 12 hr tablet, Take 1 tablet (150 mg total) by mouth 2 (two) times daily., Disp: 180 tablet, Rfl: 1 .  Calcium Carbonate-Vitamin D (CALCIUM + D PO), Take 600 mg by mouth daily. , Disp: , Rfl:  .  cilostazol (PLETAL) 100 MG tablet, Take 1 tablet (100 mg total) by mouth 2 (two) times daily. Please call and schedule a one year follow up appointment for further refills, Disp: 180 tablet, Rfl: 0 .  fluticasone (FLONASE) 50 MCG/ACT nasal spray, Place 2 sprays into both nostrils daily., Disp: 16 g, Rfl: 0 .  Fluticasone-Salmeterol (ADVAIR DISKUS) 100-50 MCG/DOSE AEPB, Inhale 1 puff into the lungs 2 (two) times daily., Disp: 180 each, Rfl: 1 .  ipratropium-albuterol (DUONEB) 0.5-2.5 (3) MG/3ML SOLN, Take 3 mLs by nebulization every 6 (six) hours as needed. Use 3 times daily x 3 days, then every 6 hours as needed., Disp: 360 mL, Rfl: 2 .  levothyroxine (SYNTHROID, LEVOTHROID) 112 MCG tablet, TAKE ONE TABLET BY MOUTH ONE TIME DAILY, Disp: 90 tablet, Rfl: 3 .  LORazepam (ATIVAN) 2 MG tablet, TAKE 1 TABLET BY MOUTH AT BEDTIME AS NEEDED FOR ANXIETY (Patient taking differently: TAKE 1 TABLET BY MOUTH AT BEDTIME), Disp: 30 tablet, Rfl: 2 .  metoprolol tartrate (LOPRESSOR) 25 MG  tablet, Take 1 tablet (25 mg total) by mouth 2 (two) times daily., Disp: 180 tablet, Rfl: 3 .  omeprazole (PRILOSEC) 40 MG capsule, TAKE 1 CAPSULE (40 MG TOTAL) BY MOUTH 2 (TWO) TIMES DAILY., Disp: 180 capsule, Rfl: 3   Review of Systems     Objective:   Physical Exam  Constitutional: She is oriented to person, place, and time. She appears well-developed and well-nourished. No distress.  HENT:  Head: Normocephalic and atraumatic.  Right Ear: External ear normal.  Left Ear: External ear normal.  Mouth/Throat: Oropharynx is clear and moist. No oropharyngeal exudate.  Eyes: Conjunctivae and EOM are normal. Pupils are equal, round, and reactive to light. Right eye exhibits no discharge. Left eye exhibits no discharge. No scleral icterus.  Neck: Normal  range of motion. Neck supple. No JVD present. No tracheal deviation present. No thyromegaly present.  Cardiovascular: Normal rate, regular rhythm, normal heart sounds and intact distal pulses.  Exam reveals no gallop and no friction rub.   No murmur heard. Pulmonary/Chest: Effort normal and breath sounds normal. No respiratory distress. She has no wheezes. She has no rales. She exhibits no tenderness.  Abdominal: Soft. Bowel sounds are normal. She exhibits no distension and no mass. There is no tenderness. There is no rebound and no guarding.  Musculoskeletal: Normal range of motion. She exhibits no edema or tenderness.  Lymphadenopathy:    She has no cervical adenopathy.  Neurological: She is alert and oriented to person, place, and time. She has normal reflexes. No cranial nerve deficit. She exhibits normal muscle tone. Coordination normal.  Skin: Skin is warm and dry. No rash noted. She is not diaphoretic. No erythema. No pallor.  Psychiatric: She has a normal mood and affect. Her behavior is normal. Judgment and thought content normal.  Vitals reviewed.   Vitals:   08/09/16 1054  BP: 118/76  Pulse: 75  SpO2: (!) 3%  Weight: 146 lb  (66.2 kg)  Height: '5\' 6"'$  (1.676 m)        Assessment:       ICD-9-CM ICD-10-CM   1. Chronic obstructive pulmonary disease, unspecified COPD type ([place]) 496 J44.9   2. History of bacterial pneumonia V12.61 Z87.01   3. COPD exacerbation ([place]) 491.21 J44.1        Plan:        Improved Disease stable now Continue your copd treatment regimen as before  Plan - full pft next few week  -ONO test on room air at night next few week - walk test room air 08/09/2016 is fine so you need not use exertional o2   - keep tank  . Will decide on return later - CT chest wo contrast as followup from July pneumonia - next few weeks  Followup - next few weeks to see me or an APP with above test results  - decide of she needs amb 02  - if abnormal ONO, consider abg testing at followup to qualify for night bipap/cpap  - based on PFT will refer pulm rehab and adjust inhaler therapy accordingly  - based on ono/.walk test - consider portable o2  - consider research trials based on folllowup data     > 50% of this > 25 min visit spent in face to face counseling or coordination of care       Dr. Brand Males, M.D., [facility].C.P Pulmonary and Critical Care Medicine Staff Physician [facility] Pager: [phone], If no answer or between  15:00h - 7:00h: call [phone]  08/09/2016 11:17 AM

## 2016-08-09 NOTE — Addendum Note (Signed)
Addended by: Collier Salina on: 08/09/2016 11:24 AM   Modules accepted: Orders

## 2016-08-09 NOTE — Patient Instructions (Addendum)
ICD-9-CM ICD-10-CM   1. Chronic obstructive pulmonary disease, unspecified COPD type ([place]) 496 J44.9   2. History of bacterial pneumonia V12.61 Z87.01   3. COPD exacerbation ([place]) 491.21 J44.1     Improved Disease stable now Continue your copd treatment regimen as before  Plan - full pft next few week  -ONO test on room air at night next few week - walk test room air 08/09/2016 is fine so you need not use exertional o2   - keep tank  . Will decide on return later - CT chest wo contrast as followup from July pneumonia - next few weeks  Followup - next few weeks to see me or an APP with above test results  - decide of she needs amb 02  - if abnormal ONO, consider abg testing at followup to qualify for night bipap/cpap  - based on PFT will refer pulm rehab and adjust inhaler therapy accordingly  - based on ono/.walk test - consider portable o2  - consider research trials based on folllowup data

## 2016-08-13 ENCOUNTER — Encounter: Payer: Medicare Other | Admitting: Internal Medicine

## 2016-08-16 ENCOUNTER — Ambulatory Visit (HOSPITAL_COMMUNITY)
Admission: RE | Admit: 2016-08-16 | Discharge: 2016-08-16 | Disposition: A | Discharge status: Home / Self Care | Payer: Medicare Other | Source: Ambulatory Visit | Attending: Internal Medicine | Admitting: Internal Medicine

## 2016-08-16 DIAGNOSIS — J449 Chronic obstructive pulmonary disease, unspecified: Secondary | ICD-10-CM

## 2016-08-16 DIAGNOSIS — J441 Chronic obstructive pulmonary disease with (acute) exacerbation: Secondary | ICD-10-CM

## 2016-08-16 DIAGNOSIS — Z8701 Personal history of pneumonia (recurrent): Secondary | ICD-10-CM

## 2016-08-16 LAB — PULMONARY FUNCTION TEST
DL/VA % pred: 59 %
DL/VA: 2.99 ml/min/mmHg/L
DLCO cor % pred: 50 %
DLCO cor: 13.66 ml/min/mmHg
DLCO unc % pred: 52 %
DLCO unc: 14.1 ml/min/mmHg
FEF 25-75 Post: 0.59 L/sec
FEF 25-75 Pre: 0.49 L/sec
FEF2575-%Change-Post: 20 %
FEF2575-%Pred-Post: 30 %
FEF2575-%Pred-Pre: 25 %
FEV1-%Change-Post: 0 %
FEV1-%Pred-Post: 56 %
FEV1-%Pred-Pre: 56 %
FEV1-Post: 1.37 L
FEV1-Pre: 1.37 L
FEV1FVC-%Change-Post: -4 %
FEV1FVC-%Pred-Pre: 70 %
FEV6-%Change-Post: 6 %
FEV6-%Pred-Post: 84 %
FEV6-%Pred-Pre: 79 %
FEV6-Post: 2.56 L
FEV6-Pre: 2.41 L
FEV6FVC-%Change-Post: 2 %
FEV6FVC-%Pred-Post: 100 %
FEV6FVC-%Pred-Pre: 98 %
FVC-%Change-Post: 3 %
FVC-%Pred-Post: 83 %
FVC-%Pred-Pre: 80 %
FVC-Post: 2.65 L
FVC-Pre: 2.56 L
Post FEV1/FVC ratio: 52 %
Post FEV6/FVC ratio: 97 %
Pre FEV1/FVC ratio: 54 %
Pre FEV6/FVC Ratio: 94 %
RV % pred: 139 %
RV: 3.23 L
TLC % pred: 115 %
TLC: 6.19 L

## 2016-08-16 MED ORDER — ALBUTEROL SULFATE (2.5 MG/3ML) 0.083% IN NEBU
2.5000 mg | INHALATION_SOLUTION | Freq: Once | RESPIRATORY_TRACT | Status: AC
  Administered 2016-08-16: 2.5 mg via RESPIRATORY_TRACT

## 2016-08-21 ENCOUNTER — Telehealth: Payer: Self-pay | Admitting: Internal Medicine

## 2016-08-22 ENCOUNTER — Other Ambulatory Visit: Payer: Self-pay | Admitting: Internal Medicine

## 2016-08-23 ENCOUNTER — Ambulatory Visit (INDEPENDENT_AMBULATORY_CARE_PROVIDER_SITE_OTHER)
Admission: RE | Admit: 2016-08-23 | Discharge: 2016-08-23 | Disposition: A | Discharge status: Home / Self Care | Payer: Medicare Other | Source: Ambulatory Visit | Attending: Internal Medicine | Admitting: Internal Medicine

## 2016-08-23 DIAGNOSIS — Z8701 Personal history of pneumonia (recurrent): Secondary | ICD-10-CM

## 2016-08-23 DIAGNOSIS — J441 Chronic obstructive pulmonary disease with (acute) exacerbation: Secondary | ICD-10-CM

## 2016-08-23 DIAGNOSIS — J449 Chronic obstructive pulmonary disease, unspecified: Secondary | ICD-10-CM | POA: Diagnosis not present

## 2016-08-26 ENCOUNTER — Other Ambulatory Visit: Payer: Self-pay | Admitting: Cardiology

## 2016-08-27 ENCOUNTER — Encounter: Payer: Self-pay | Admitting: Adult Health

## 2016-08-27 ENCOUNTER — Ambulatory Visit (INDEPENDENT_AMBULATORY_CARE_PROVIDER_SITE_OTHER): Payer: Medicare Other | Admitting: Adult Health

## 2016-08-27 DIAGNOSIS — J9621 Acute and chronic respiratory failure with hypoxia: Secondary | ICD-10-CM | POA: Diagnosis not present

## 2016-08-27 DIAGNOSIS — J449 Chronic obstructive pulmonary disease, unspecified: Secondary | ICD-10-CM

## 2016-08-27 NOTE — Assessment & Plan Note (Signed)
Recent flare now resolving  No exertional desats , no longer requiring daytime O2  Check ONO , if neg , can d/c O2.

## 2016-08-27 NOTE — Addendum Note (Signed)
Addended by: Doroteo Glassman D on: 08/27/2016 04:18 PM   Modules accepted: Orders

## 2016-08-27 NOTE — Progress Notes (Signed)
Subjective:    Patient ID: [name], female    DOB: March 15, 1946, 70 y.o.   MRN: [id number]  HPI 70 year old female former smoker seen for pulmonary consult 08/09/2016 for COPD  TEST   08/27/2016 Follow up : COPD /Test results  Patient returns for a two-week follow-up. Patient was seen last for a pulmonary consult for COPD. She had been admitted in July and September for COPD exacerbation and pneumonia. Patient was set up for a CT chest on 08/23/2016 that showed a resolving pneumonia in the left upper lobe with residual pleural thickening along the oblique fissure. Small nonspecific pulmonary nodule in the lower lobes measuring 4 mm. PFT done on  November 2 showed moderate COPD with an FEV1 at 56%, ratio 52, FVC 83%, no significant bronchodilator response, DLCO 52%. She is on Advair 100 twice daily and DuoNeb 3 times daily. Flu and PVX and Prevnar are utd.  Says she is doing well, starting to feel better. Decreased dyspnea.   Past Medical History:  Diagnosis Date  . Cancer ([place]) 2010   invasive ductal breast cancer; on tamoxifen  . Complication of anesthesia    was in ICU after lung surgery 2012  . COPD (chronic obstructive pulmonary disease) ([place])   . Diverticulitis    required colcectomy  . Emphysema of lung ([place])   . Fibromyalgia 1995  . GERD (gastroesophageal reflux disease)   . Hearing loss of both ears    uses hearing aids  bilaterally  . Hx of colonic polyps    Dr. Cristina Gong -last study '11  . Hyperlipidemia   . Hypertension   . Hypothyroidism   . Osteoporosis   . Peripheral vascular disease [facility]) May '12 - CT angio   SMA chronic occlusion; stenosis of celiac trunk.  . Shortness of breath dyspnea   . Varicose veins of leg with swelling    varicose vein surgery - Dr. Aleda Grana  . Wears glasses    Current Outpatient Prescriptions on File Prior to Visit  Medication Sig Dispense Refill  . albuterol (PROVENTIL HFA;VENTOLIN HFA) 108 (90 Base) MCG/ACT inhaler  Inhale 2 puffs into the lungs every 4 (four) hours as needed for wheezing. As a RESCUE medication 1 Inhaler 1  . amitriptyline (ELAVIL) 50 MG tablet TAKE 1 TABLET (50 MG TOTAL) BY MOUTH AT BEDTIME. 90 tablet 3  . amLODipine (NORVASC) 5 MG tablet Take 1 tablet (5 mg total) by mouth daily. (Patient taking differently: Take 5 mg by mouth every evening. ) 90 tablet 3  . aspirin 81 MG tablet Take 81 mg by mouth every evening.     Marland Kitchen atorvastatin (LIPITOR) 80 MG tablet Take 1 tablet (80 mg total) by mouth daily. Please keep upcoming appointment for future refills. Thank you 30 tablet 0  . B Complex Vitamins (VITAMIN-B COMPLEX PO) Take 1 tablet by mouth daily.     . bisacodyl (DULCOLAX) 5 MG EC tablet Take 1 tablet (5 mg total) by mouth daily as needed for moderate constipation. (Patient taking differently: Take 5 mg by mouth daily. ) 30 tablet 11  . buPROPion (WELLBUTRIN SR) 150 MG 12 hr tablet Take 1 tablet (150 mg total) by mouth 2 (two) times daily. 180 tablet 1  . Calcium Carbonate-Vitamin D (CALCIUM + D PO) Take 600 mg by mouth daily.     . cilostazol (PLETAL) 100 MG tablet Take 1 tablet (100 mg total) by mouth 2 (two) times daily. Please call and schedule a one year follow up  appointment for further refills 180 tablet 0  . fluticasone (FLONASE) 50 MCG/ACT nasal spray Place 2 sprays into both nostrils daily. 16 g 0  . Fluticasone-Salmeterol (ADVAIR DISKUS) 100-50 MCG/DOSE AEPB Inhale 1 puff into the lungs 2 (two) times daily. 180 each 1  . ipratropium-albuterol (DUONEB) 0.5-2.5 (3) MG/3ML SOLN Take 3 mLs by nebulization every 6 (six) hours as needed. Use 3 times daily x 3 days, then every 6 hours as needed. 360 mL 2  . levothyroxine (SYNTHROID, LEVOTHROID) 112 MCG tablet TAKE ONE TABLET BY MOUTH ONE TIME DAILY 90 tablet 3  . LORazepam (ATIVAN) 2 MG tablet TAKE 1 TABLET BY MOUTH AT BEDTIME AS NEEDED FOR ANXIETY (Patient taking differently: TAKE 1 TABLET BY MOUTH AT BEDTIME) 30 tablet 2  . metoprolol  tartrate (LOPRESSOR) 25 MG tablet TAKE 1 TABLET BY MOUTH 2 TIMES DAILY. 180 tablet 3  . omeprazole (PRILOSEC) 40 MG capsule TAKE 1 CAPSULE (40 MG TOTAL) BY MOUTH 2 (TWO) TIMES DAILY. 180 capsule 3   No current facility-administered medications on file prior to visit.       Review of Systems Constitutional:   No  weight loss, night sweats,  Fevers, chills, fatigue, or  lassitude.  HEENT:   No headaches,  Difficulty swallowing,  Tooth/dental problems, or  Sore throat,                No sneezing, itching, ear ache, nasal congestion, post nasal drip,   CV:  No chest pain,  Orthopnea, PND, swelling in lower extremities, anasarca, dizziness, palpitations, syncope.   GI  No heartburn, indigestion, abdominal pain, nausea, vomiting, diarrhea, change in bowel habits, loss of appetite, bloody stools.   Resp: No shortness of breath with exertion or at rest.  No excess mucus, no productive cough,  No non-productive cough,  No coughing up of blood.  No change in color of mucus.  No wheezing.  No chest wall deformity  Skin: no rash or lesions.  GU: no dysuria, change in color of urine, no urgency or frequency.  No flank pain, no hematuria   MS:  No joint pain or swelling.  No decreased range of motion.  No back pain.  Psych:  No change in mood or affect. No depression or anxiety.  No memory loss.         Objective:   Physical Exam Vitals:   08/27/16 1434  BP: 118/70  Pulse: 68  Temp: 97.6 F (36.4 C)  TempSrc: Oral  SpO2: 95%  Weight: 145 lb 12.8 oz (66.1 kg)  Height: '5\' 6"'$  (1.676 m)   GEN: A/Ox3; pleasant , NAD, well nourished    HEENT:  [place]/AT,  EACs-clear, TMs-wnl, NOSE-clear, THROAT-clear, no lesions, no postnasal drip or exudate noted.   NECK:  Supple w/ fair ROM; no JVD; normal carotid impulses w/o bruits; no thyromegaly or nodules palpated; no lymphadenopathy.    RESP  Clear  P & A; w/o, wheezes/ rales/ or rhonchi. no accessory muscle use, no dullness to percussion  CARD:   RRR, no m/r/g  , no peripheral edema, pulses intact, no cyanosis or clubbing.  GI:   Soft & nt; nml bowel sounds; no organomegaly or masses detected.   Musco: Warm bil, no deformities or joint swelling noted.   Neuro: alert, no focal deficits noted.    Skin: Warm, no lesions or rashes  CT chest 08/23/16 No acute cardiopulmonary abnormalities. 2. Resolving pneumonia identified within the left upper lobe with residual pleural thickening along  the oblique fissure 3. Small nonspecific pulmonary nodules identified within the lower lobes measuring up to 4 mm. Non-contrast chest CT can be considered in 12 months if patient is high-risk.   Merle Whitehorn NP-C  [facility]  08/27/2016        Assessment & Plan:

## 2016-08-27 NOTE — Patient Instructions (Addendum)
Continue on Advair 1 puff Twice daily  , rinse after use.  Continue on Duoneb Three times a day   We are setting you up to ONO to check your oxygen while you sleep.  follow up Dr. Chase Caller in 4 months and As needed   Set up CT chest in 1 year to follow lung nodule.

## 2016-08-27 NOTE — Assessment & Plan Note (Signed)
Moderate COPD on PFT  Compensated on present regimen   Plan  Patient Instructions  Continue on Advair 1 puff Twice daily  , rinse after use.  Continue on Duoneb Three times a day   We are setting you up to ONO to check your oxygen while you sleep.  follow up Dr. Chase Caller in 4 months and As needed   Set up CT chest in 1 year to follow lung nodule.

## 2016-08-31 ENCOUNTER — Telehealth: Payer: Self-pay | Admitting: Nurse Practitioner

## 2016-08-31 ENCOUNTER — Encounter: Payer: Self-pay | Admitting: Nurse Practitioner

## 2016-08-31 NOTE — Telephone Encounter (Signed)
I would not be the one taking care of her sleep/OSA/CPAP.  Can we see who is her sleep MD?

## 2016-08-31 NOTE — Telephone Encounter (Signed)
New message  Pt called and stated that she still has not heard from [facility] concerning Cpap machine  Stated she was to follow up by today  Please call and advise

## 2016-08-31 NOTE — Telephone Encounter (Signed)
Spoke to the patient and found out she called the wrong office

## 2016-09-03 ENCOUNTER — Encounter (INDEPENDENT_AMBULATORY_CARE_PROVIDER_SITE_OTHER): Payer: Self-pay

## 2016-09-03 ENCOUNTER — Ambulatory Visit (INDEPENDENT_AMBULATORY_CARE_PROVIDER_SITE_OTHER): Payer: Medicare Other | Admitting: Nurse Practitioner

## 2016-09-03 ENCOUNTER — Encounter: Payer: Self-pay | Admitting: Nurse Practitioner

## 2016-09-03 VITALS — BP 132/78 | HR 99 | Ht 66.0 in | Wt 147.4 lb

## 2016-09-03 DIAGNOSIS — K551 Chronic vascular disorders of intestine: Secondary | ICD-10-CM | POA: Diagnosis not present

## 2016-09-03 DIAGNOSIS — E7800 Pure hypercholesterolemia, unspecified: Secondary | ICD-10-CM

## 2016-09-03 DIAGNOSIS — J438 Other emphysema: Secondary | ICD-10-CM | POA: Diagnosis not present

## 2016-09-03 LAB — LIPID PANEL
Cholesterol: 174 mg/dL (ref <200)
HDL: 73 mg/dL (ref >50)
LDL Cholesterol: 52 mg/dL (ref <100)
Total CHOL/HDL Ratio: 2.4 Ratio (ref <5.0)
Triglycerides: 247 mg/dL — ABNORMAL HIGH (ref <150)
VLDL: 49 mg/dL — ABNORMAL HIGH (ref <30)

## 2016-09-03 NOTE — Progress Notes (Signed)
CARDIOLOGY OFFICE NOTE  Date:  09/03/2016    [name] Date of Birth: [birth year] Medical Record #[id number]  PCP:  Hoyt Koch, MD  Cardiologist:  Annabell Howells  Chief Complaint  Patient presents with  . Shortness of Breath    Follow up visit - seen for Dr. Aundra Dubin    History of Present Illness: [name] is a 70 y.o. female who presents today for a follow up visit. Seen for Dr. Aundra Dubin. She will follow with me going forward.   She has a history of smoking, HTN, COPD, right breast cancer, hyperlipidemia, PAD, and mesenteric vascular disease.   Patient had been having nocturnal and post-prandial abdominal cramping.  She had a mesenteric angiogram done in 5/12 by Dr. Bridgett Larsson, showing occluded celiac and SMA arteries with patent IMA.  She was planned for aorto-mesenteric bypass. She was referred for cardiology evaluation prior to surgery. However, it was ultimately decided not to treat her mesenteric disease surgically.   Given her exertional chest tightness and shortness of breath as well as known vascular disease, Dr. Aundra Dubin set her up for left heart cath.  This was done in 5/12 and showed a 70-80% ostial stenosis of a small to moderate 2nd diagonal.  This was too small to intervene on and not likely to be particularly symptomatic.  Lower extremity arterial dopplers showed a severe focal proximal right SFA stenosis with collaterals from the PFA. She had a VATS for a right-sided lung nodule that showed granulomatous inflammation (no cancer).  She has quit smoking.  She saw Dr. Fletcher Anon for PAD evaluation, and it was decided to treat her medically.  She tried cilostazol but did not see much difference with its use so stopped it.    Last seen back in August of 2016 - felt to be doing ok.   She has had 2 bouts of pneumonia - last discharged in September. Follows closely with pulmonary.   Comes back today. Here alone. She says she is doing ok.  Cardiac status is ok. She  denies chest pain. Not having any belly issues. Says her legs feel good. She was put back on Pletal last year - has tolerated well. Remains short of breath and uses oxygen at night. She has had a cough develop over the last day. No fever. Does not need refills today.  She feels like she is doing ok. Labs are up to date except for her lipids. No real concerns from our standpoint. She is not smoking.   PMH: 1. Hypothyroidism 2. HTN 3. GERD 4. Hypothyroidism 5. OSA 6. 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  The IMA was patent.  CTA abdomen (6/14) showed occluded celiac and SMA with distal reconstitution, no changes to suggest bowel infarction or ischemia.  8. Hyperlipidemia 9. COPD: PFTs (6/12) with moderate obstructive airways disease and response to bronchodilator.  10. Hyperlipidemia 11. Fibromyalgia 12. Breast cancer s/p mastectomy and chemotherapy in 2010.  13. CAD: LHC (5/12) with 70-80% ostial stenosis of a small-moderate 2nd diagonal.  This was too small to intervene upon and unlikely to cause significant symptoms.  14. PAD: Peripheral arterial dopplers (5/12) with severe focal proximal right SFA stenosis.  There were collaterals from the PFA.  Patient has claudication symptoms. Peripheral arterial dopplers in 5/13 were stable compared to 5/12. Peripheral arterial dopplers (8/14) with ABIs 0.71 on right, 1.1 on left (known right  SFA occlusion).  15. 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 VATS 8/12 for right upper lobe nodule showed granulomatous material.    Past Medical History:  Diagnosis Date  . Cancer ([place]) 2010   invasive ductal breast cancer; on tamoxifen  . Complication of anesthesia    was in ICU after lung surgery 2012  . COPD (chronic obstructive pulmonary disease) ([place])   . Diverticulitis    required colcectomy    . Emphysema of lung ([place])   . Fibromyalgia 1995  . GERD (gastroesophageal reflux disease)   . Hearing loss of both ears    uses hearing aids  bilaterally  . Hx of colonic polyps    Dr. Cristina Gong -last study '11  . Hyperlipidemia   . Hypertension   . Hypothyroidism   . Osteoporosis   . Peripheral vascular disease [facility]) May '12 - CT angio   SMA chronic occlusion; stenosis of celiac trunk.  . Shortness of breath dyspnea   . Varicose veins of leg with swelling    varicose vein surgery - Dr. Aleda Grana  . Wears glasses     Past Surgical History:  Procedure Laterality Date  . ABDOMINAL HYSTERECTOMY  1973   metorrhagia  . APPENDECTOMY    . BREAST SURGERY  2010   mastectomy with reconstructive surgery  . BREAST SURGERY  2010   rt lump x2  . BREAST SURGERY  2010   partial rt br reduction  . CARDIAC CATHETERIZATION  '11   no obstructive coronary disease  . COLECTOMY  2002   sigmoid  . COLONOSCOPY W/ POLYPECTOMY    . LUNG SURGERY  9/12   rt mid lung wedge  . PARTIAL MASTECTOMY WITH NEEDLE LOCALIZATION Left 02/23/2013   Procedure:  LEFT PARTIAL MASTECTOMY WITH NEEDLE LOCALIZATION;  Surgeon: Adin Hector, MD;  Location: [facility];  Service: General;  Laterality: Left;  . [place]   right upper lobe of lung as part of VAT-nodule/benign     Medications: Current Outpatient Prescriptions  Medication Sig Dispense Refill  . albuterol (PROVENTIL HFA;VENTOLIN HFA) 108 (90 Base) MCG/ACT inhaler Inhale 2 puffs into the lungs every 4 (four) hours as needed for wheezing. As a RESCUE medication 1 Inhaler 1  . amitriptyline (ELAVIL) 50 MG tablet TAKE 1 TABLET (50 MG TOTAL) BY MOUTH AT BEDTIME. 90 tablet 3  . amLODipine (NORVASC) 5 MG tablet Take 1 tablet (5 mg total) by mouth daily. (Patient taking differently: Take 5 mg by mouth every evening. ) 90 tablet 3  . aspirin 81 MG tablet Take 81 mg by mouth every evening.     Marland Kitchen atorvastatin (LIPITOR) 80 MG tablet  Take 1 tablet (80 mg total) by mouth daily. Please keep upcoming appointment for future refills. Thank you 30 tablet 0  . B Complex Vitamins (VITAMIN-B COMPLEX PO) Take 1 tablet by mouth daily.     . bisacodyl (DULCOLAX) 5 MG EC tablet Take 1 tablet (5 mg total) by mouth daily as needed for moderate constipation. (Patient taking differently: Take 5 mg by mouth daily. ) 30 tablet 11  . buPROPion (WELLBUTRIN SR) 150 MG 12 hr tablet Take 1 tablet (150 mg total) by mouth 2 (two) times daily. 180 tablet 1  . Calcium Carbonate-Vitamin D (CALCIUM + D PO) Take 600 mg by mouth daily.     . cilostazol (PLETAL) 100 MG tablet Take 1 tablet (100 mg total) by mouth 2 (two)  times daily. Please call and schedule a one year follow up appointment for further refills 180 tablet 0  . fluticasone (FLONASE) 50 MCG/ACT nasal spray Place 2 sprays into both nostrils daily. 16 g 0  . Fluticasone-Salmeterol (ADVAIR DISKUS) 100-50 MCG/DOSE AEPB Inhale 1 puff into the lungs 2 (two) times daily. 180 each 1  . ipratropium-albuterol (DUONEB) 0.5-2.5 (3) MG/3ML SOLN Take 3 mLs by nebulization every 6 (six) hours as needed. Use 3 times daily x 3 days, then every 6 hours as needed. 360 mL 2  . levothyroxine (SYNTHROID, LEVOTHROID) 112 MCG tablet TAKE ONE TABLET BY MOUTH ONE TIME DAILY 90 tablet 3  . LORazepam (ATIVAN) 2 MG tablet TAKE 1 TABLET BY MOUTH AT BEDTIME AS NEEDED FOR ANXIETY (Patient taking differently: TAKE 1 TABLET BY MOUTH AT BEDTIME) 30 tablet 2  . metoprolol tartrate (LOPRESSOR) 25 MG tablet TAKE 1 TABLET BY MOUTH 2 TIMES DAILY. 180 tablet 3  . omeprazole (PRILOSEC) 40 MG capsule TAKE 1 CAPSULE (40 MG TOTAL) BY MOUTH 2 (TWO) TIMES DAILY. 180 capsule 3   No current facility-administered medications for this visit.     Allergies: Allergies  Allergen Reactions  . Sulfonamide Derivatives Swelling    Social History: The patient  reports that she quit smoking about 5 months ago. Her smoking use included Cigarettes.  She has a 37.50 pack-year smoking history. She has never used smokeless tobacco. She reports that she drinks about 6.0 oz of alcohol per week . She reports that she does not use drugs.   Family History: The patient's family history includes COPD in her mother; Cancer in her sister and sister; Colon cancer in her mother; Coronary artery disease in her brother and father; Diabetes in her mother; Emphysema in her mother; Heart attack in her brother and father; Heart disease in her father; Heart failure in her sister; Hypertension in her mother, sister, and sister; Melanoma in her sister; Pancreatic cancer in her sister; Sudden death in her father; Throat cancer in her sister.   Review of Systems: Please see the history of present illness.   Otherwise, the review of systems is positive for none.   All other systems are reviewed and negative.   Physical Exam: VS:  BP 132/78   Pulse 99   Ht '5\' 6"'$  (1.676 m)   Wt 147 lb 6.4 oz (66.9 kg)   SpO2 93% Comment: at rest  BMI 23.79 kg/m  .  BMI Body mass index is 23.79 kg/m.  Wt Readings from Last 3 Encounters:  09/03/16 147 lb 6.4 oz (66.9 kg)  08/27/16 145 lb 12.8 oz (66.1 kg)  08/09/16 146 lb (66.2 kg)    General: Pleasant. Chronically ill appearing but alert and in no acute distress.   HEENT: Normal.  Neck: Supple, no JVD, carotid bruits, or masses noted.  Cardiac: Regular rate and rhythm. Rate is about 88 by me. No murmurs, rubs, or gallops. No edema.  Respiratory:  Lungs with decreased breath sounds but with normal work of breathing.  GI: Soft and nontender.  MS: No deformity or atrophy. Gait and ROM intact.  Skin: Warm and dry. Color is normal.  Neuro:  Strength and sensation are intact and no gross focal deficits noted.  Psych: Alert, appropriate and with normal affect.   LABORATORY DATA:  EKG:  EKG is not ordered today.   Lab Results  Component Value Date   WBC 8.8 07/10/2016   HGB 14.5 07/10/2016   HCT 44.9 07/10/2016  PLT  413 (H) 07/10/2016   GLUCOSE 109 (H) 07/10/2016   CHOL 177 08/03/2015   TRIG 93 08/03/2015   HDL 93 08/03/2015   LDLDIRECT 88.0 08/18/2013   LDLCALC 65 08/03/2015   ALT 22 07/03/2016   AST 20 07/03/2016   NA 140 07/10/2016   K 3.5 07/10/2016   CL 100 (L) 07/10/2016   CREATININE 0.86 07/10/2016   BUN 19 07/10/2016   CO2 33 (H) 07/10/2016   TSH 1.83 08/12/2015   INR 0.98 03/23/2013    BNP (last 3 results)  Recent Labs  07/02/16 1235  BNP 28.4    ProBNP (last 3 results) No results for input(s): PROBNP in the last 8760 hours.   Other Studies Reviewed Today:  Echo Study Conclusions 06/2016  - Left ventricle: The cavity size was normal. Systolic function was   normal. The estimated ejection fraction was in the range of 55%   to 60%. Doppler parameters are consistent with abnormal left   ventricular relaxation (grade 1 diastolic dysfunction). - Mitral valve: Calcified annulus. Mildly thickened leaflets .   Assessment/Plan: 1. PAD: had ABI's last year. No real symptoms - will plan on rechecking next year.   2. Mesenteric ischemia: Currently, no abdominal pain.  Last episode in 6/14 seems to have been related to an ileus. Continue non-surgical management at this point.   3. CAD: Nonobstructive CAD.  No active chest pain. Continue ASA, statin.   4. COPD: followed by pulmonary. She has had pneumonia earlier this fall.    5. HTN: BP looks good on her current regimen.   6. Hyperlipidemia: Check lipids today. She is otherwise up to date with her labs.   Current medicines are reviewed with the patient today.  The patient does not have concerns regarding medicines other than what has been noted above.  The following changes have been made:  See above.  Labs/ tests ordered today include:    Orders Placed This Encounter  Procedures  . Lipid panel     Disposition:   FU with me in one year with fasting labs.   Patient is agreeable to this plan and will call if any  problems develop in the interim.   Signed: [name], RN, ANP-C 09/03/2016 3:28 PM  [facility] [address] [place], [place]  [address] Phone: [phone] Fax: [phone]

## 2016-09-03 NOTE — Patient Instructions (Addendum)
We will be checking the following labs today - lipids   Medication Instructions:    Continue with your current medicines.     Testing/Procedures To Be Arranged:  N/A  Follow-Up:   See me in one year with fasting labs    Other Special Instructions:   N/A    If you need a refill on your cardiac medications before your next appointment, please call your pharmacy.   Call the [place] office at [phone] if you have any questions, problems or concerns.

## 2016-09-03 NOTE — Telephone Encounter (Signed)
Called and spoke to pt. Pt states she has not heard from [facility] about ONO. Order was placed and confirmed by [facility] that it was received. Advised pt we will call [facility] in 11.21.17 morning and find out the hold up. Will call [facility] in morning.

## 2016-09-13 NOTE — Telephone Encounter (Signed)
Patient still waiting to hear about ONO, [facility] have not contacted patient. Contact # [phone].[name]

## 2016-09-13 NOTE — Telephone Encounter (Signed)
Spoke with Melissa at [facility]. She states that they do have the pt's order. They will contact her ASAP. I called and spoke with pt and made her aware. Nothing further was needed.

## 2016-09-14 ENCOUNTER — Other Ambulatory Visit: Payer: Self-pay | Admitting: Internal Medicine

## 2016-09-17 NOTE — Telephone Encounter (Signed)
Faxed to pharmacy

## 2016-09-25 ENCOUNTER — Other Ambulatory Visit: Payer: Self-pay | Admitting: Cardiology

## 2016-10-12 ENCOUNTER — Other Ambulatory Visit: Payer: Self-pay | Admitting: Cardiology

## 2016-10-12 ENCOUNTER — Other Ambulatory Visit: Payer: Self-pay | Admitting: Internal Medicine

## 2016-10-12 DIAGNOSIS — I739 Peripheral vascular disease, unspecified: Secondary | ICD-10-CM

## 2016-10-22 ENCOUNTER — Other Ambulatory Visit: Payer: Self-pay | Admitting: Pulmonary Disease

## 2016-10-22 ENCOUNTER — Telehealth: Payer: Self-pay | Admitting: Internal Medicine

## 2016-10-22 DIAGNOSIS — J449 Chronic obstructive pulmonary disease, unspecified: Secondary | ICD-10-CM

## 2016-10-22 DIAGNOSIS — Z006 Encounter for examination for normal comparison and control in clinical research program: Secondary | ICD-10-CM

## 2016-10-22 DIAGNOSIS — J439 Emphysema, unspecified: Secondary | ICD-10-CM

## 2016-10-22 NOTE — Telephone Encounter (Signed)
Daneil Dan   I am ordering stand of care Alpha 1 - please have her do it at time of ov 10/26/16 though that visit is for research.   Thanks  Dr. Brand Males, M.D., [facility].C.P Pulmonary and Critical Care Medicine Staff Physician [address] [facility] Pager: [phone], If no answer or between  15:00h - 7:00h: call [phone]  10/22/2016 1:51 PM

## 2016-10-26 ENCOUNTER — Other Ambulatory Visit: Payer: Medicare Other

## 2016-10-26 ENCOUNTER — Telehealth: Payer: Self-pay | Admitting: Internal Medicine

## 2016-10-26 ENCOUNTER — Ambulatory Visit (INDEPENDENT_AMBULATORY_CARE_PROVIDER_SITE_OTHER): Payer: Medicare Other | Admitting: Pulmonary Disease

## 2016-10-26 ENCOUNTER — Ambulatory Visit (INDEPENDENT_AMBULATORY_CARE_PROVIDER_SITE_OTHER): Payer: Medicare Other | Admitting: Adult Health

## 2016-10-26 ENCOUNTER — Ambulatory Visit (INDEPENDENT_AMBULATORY_CARE_PROVIDER_SITE_OTHER): Payer: Medicare Other | Admitting: Internal Medicine

## 2016-10-26 ENCOUNTER — Encounter: Payer: Self-pay | Admitting: Adult Health

## 2016-10-26 VITALS — BP 119/74 | HR 74 | Temp 97.7°F | Resp 16 | Ht 65.0 in | Wt 150.2 lb

## 2016-10-26 DIAGNOSIS — J449 Chronic obstructive pulmonary disease, unspecified: Secondary | ICD-10-CM

## 2016-10-26 DIAGNOSIS — J439 Emphysema, unspecified: Secondary | ICD-10-CM

## 2016-10-26 DIAGNOSIS — Z006 Encounter for examination for normal comparison and control in clinical research program: Secondary | ICD-10-CM

## 2016-10-26 DIAGNOSIS — J441 Chronic obstructive pulmonary disease with (acute) exacerbation: Secondary | ICD-10-CM

## 2016-10-26 DIAGNOSIS — J9621 Acute and chronic respiratory failure with hypoxia: Secondary | ICD-10-CM

## 2016-10-26 LAB — PULMONARY FUNCTION TEST
FEF 25-75 Post: 0.6 L/sec
FEF 25-75 Pre: 0.3 L/sec
FEF2575-%Change-Post: 99 %
FEF2575-%Pred-Post: 32 %
FEF2575-%Pred-Pre: 16 %
FEV1-%Change-Post: 22 %
FEV1-%Pred-Post: 44 %
FEV1-%Pred-Pre: 36 %
FEV1-Post: 1.03 L
FEV1-Pre: 0.84 L
FEV1FVC-%Change-Post: -13 %
FEV1FVC-%Pred-Pre: 63 %
FEV6-%Change-Post: 30 %
FEV6-%Pred-Post: 73 %
FEV6-%Pred-Pre: 56 %
FEV6-Post: 2.14 L
FEV6-Pre: 1.64 L
FEV6FVC-%Change-Post: -7 %
FEV6FVC-%Pred-Post: 91 %
FEV6FVC-%Pred-Pre: 98 %
FVC-%Change-Post: 41 %
FVC-%Pred-Post: 81 %
FVC-%Pred-Pre: 57 %
FVC-Post: 2.47 L
FVC-Pre: 1.75 L
Post FEV1/FVC ratio: 42 %
Post FEV6/FVC ratio: 87 %
Pre FEV1/FVC ratio: 48 %
Pre FEV6/FVC Ratio: 94 %

## 2016-10-26 MED ORDER — PREDNISONE 10 MG PO TABS: ORAL_TABLET | ORAL | 0 refills | Status: DC

## 2016-10-26 NOTE — Progress Notes (Signed)
Title: Randomised, Double-Blind (Sponsor Open), Placebo-Controlled, Multicentre, Dose Ranging Study to Evaluate the Efficacy and Safety of Danirixin Tablets Administered Twice Daily Compared With Placebo for 24 Weeks in Adults With COPD  Sponsor: [employer] , Protoocol Number: [id number], NCT Trial #: [id number]  Synopsis:  Following baseline assessments collected over a 7 day period participants will be randomized (1:1:1:1:1:1) to receive one of five dose strengths of danirixin (5 milligram [mg], 10 mg, 25 mg, 35 mg and 50 mg) or placebo. Study treatment will be administered orally twice daily for 24 weeks. Participants will continue with their standard of care inhaled medications during study. Follow up will continue up to 28 days post last dose  Key Inclusion Criteria: age 20-80, COPD with fev1 >/= 40%, ratio <0.7, >/= 2 copd exacerbations in past 1 year treated with either antibiotics or steroids, but >/= 14 days since last antibiotic or steroid for exacerbation, >/= 10 pack smoking history  Key Exclusion Criteria: non-COPD lung disease, pulse ox < 88% at rest on room air (i.e, restting o2 patients excluded), active phase of pulmonary rehabilitation (maintenance ok), previous lung surgery, QTc prolongation, HIV positive etc..   Key end points: Changes in respiratory symptoms and copd exacerbations and safety of DNX  Key features of DNX:   selective CXC chemokine receptor (CXCR2) antagonist . Positive trends in COPD exacerbation, symptoms and health status  Safety of DNX: As of April 2017 (5 completed Phase 1 studies, and 1 Phase 2A study of moderate copd patients with 45 patients):Marland Kitchen Most side effects are seen at '100mg'$  bid (current study max is '50mg'$  bid)  Phase 1 Normal Adults - dose </= '50mg'$  bid Study drug -    placebo  General All side effects mild - moderate. No changes in labs, EKG and vitals including PMN per PI meeting slide  x  Any adverse event 30% 13%  Headache 10-20% 0-17%   Fatigue/Lethargy 0-10% 0-3%  Abd Pain 0-11% (20% at '200mg'$  bid) 0%   Cough 0% 0%  Study withdrawal 0% 0%  SAE 0% 0%   - 3. COPD  COPD patients -data from IB April 2017 and PI meeting Spring 2017 Study drug Placebo  Efficacy  AECOPD and CAT scores better with drug after 1 year x  General No change in PMN, EKG, Vitals, Labs, Spirometry but produces mild to moderate headache and diarrhea  x  Any AE 52-77% 55-80%  Cystitis 5% at '75mg'$  bid 3%  Backpain 5% at '75mg'$  bid 0%  Nasopharyngitis 29% at '75mg'$  bid 33%  Headache 9% at '75mg'$  bid 8%  Upper abdominal pain 0% at '75mg'$  bid 6%  Infection rate 42% 50%  SAE 10 events 31 events   ........................................................................................................................................................................  Clinical Research Coordinator / Research RN note : This visit for Subject LOTTIE SIGMAN with 02-20-1946 on 10/26/2016 for the above protocol is Visit/Encounter # 0 and 1  and is for purpose of research . The consent for this encounter is under Protocol Version Amendment 1 and  is currently IRB approved. Subject expressed interest and consent in participating as a study subject. Subject thanked for participation.  Following informed consent and subsequent visit procedures, In this visit 10/26/2016 the subject will be evaluated by investigator named Dr. Vaughan Browner, MD. This research coordinator has verified that the investigator is up to date with his training logs  Patient provided informed consent to participate in the above listed clinical trial at 7:35am prior to the initiation of any study procedures.  See paper source maintained  by [facility] for documentation of informed consent process.  Electronic documentation of other visit 0 and visit 1 are recorded in the Office Visit encounter with this same date.  Signed by,  Doreatha Martin, RN, BSN, [place] Nurse II [phone] [place], [place] 8:48 AM 10/26/2016

## 2016-10-26 NOTE — Progress Notes (Addendum)
Title: Randomised, Double-Blind (Sponsor Open), Placebo-Controlled, Multicentre, Dose Ranging Study to Evaluate the Efficacy and Safety of Danirixin Tablets Administered Twice Daily Compared With Placebo for 24 Weeks in Adults With COPD  Sponsor: [employer] , Protoocol Number: [id number], NCT Trial #: [id number]  Synopsis:  Following baseline assessments collected over a 7 day period participants will be randomized (1:1:1:1:1:1) to receive one of five dose strengths of danirixin (5 milligram [mg], 10 mg, 25 mg, 35 mg and 50 mg) or placebo. Study treatment will be administered orally twice daily for 24 weeks. Participants will continue with their standard of care inhaled medications during study. Follow up will continue up to 28 days post last dose  Key Inclusion Criteria: age 72-80, COPD with fev1 >/= 40%, ratio <0.7, >/= 2 copd exacerbations in past 1 year treated with either antibiotics or steroids, but >/= 14 days since last antibiotic or steroid for exacerbation, >/= 10 pack smoking history  Key Exclusion Criteria: non-COPD lung disease, pulse ox < 88% at rest on room air (i.e, restting o2 patients excluded), active phase of pulmonary rehabilitation (maintenance ok), previous lung surgery, QTc prolongation, HIV positive etc..   Key end points: Changes in respiratory symptoms and copd exacerbations and safety of DNX  Key features of DNX:   selective CXC chemokine receptor (CXCR2) antagonist . Positive trends in COPD exacerbation, symptoms and health status  Safety of DNX: As of April 2017 (5 completed Phase 1 studies, and 1 Phase 2A study of moderate copd patients with 45 patients):[name] Most side effects are seen at '100mg'$  bid (current study max is '50mg'$  bid)  Phase 1 Normal Adults - dose </= '50mg'$  bid Study drug -    placebo  General All side effects mild - moderate. No changes in labs, EKG and vitals including PMN per PI meeting slide  x  Any adverse event 30% 13%  Headache 10-20% 0-17%   Fatigue/Lethargy 0-10% 0-3%  Abd Pain 0-11% (20% at '200mg'$  bid) 0%   Cough 0% 0%  Study withdrawal 0% 0%  SAE 0% 0%   - 3. COPD  COPD patients -data from IB April 2017 and PI meeting Spring 2017 Study drug Placebo  Efficacy  AECOPD and CAT scores better with drug after 1 year x  General No change in PMN, EKG, Vitals, Labs, Spirometry but produces mild to moderate headache and diarrhea  x  Any AE 52-77% 55-80%  Cystitis 5% at '75mg'$  bid 3%  Backpain 5% at '75mg'$  bid 0%  Nasopharyngitis 29% at '75mg'$  bid 33%  Headache 9% at '75mg'$  bid 8%  Upper abdominal pain 0% at '75mg'$  bid 6%  Infection rate 42% 50%  SAE 10 events 31 events    ........................................................................................................................................................................  [name]    [id number]    08-Jul-1946  Primary Care Physician:Elizabeth Becky [place], MD  Referring Physician: Hoyt Koch, MD [address] [place], [place] [address]  Chief complaint:  Follow up for COPD. Pre trial assessment for clinical trial for danirixin  HPI: [name] is here for follow up for COPD and to be evaluated for for a clinical trial for a new anti inflammatory drug for COPD. She states that she is feeling more DOE for the past 1-2 weeks. Denies cough, fevers, chills, chest pain, palpitations. She is in an Advair inhaler and albuterol rescue inhaler and duo nebs.   Outpatient Encounter Prescriptions as of 10/26/2016  Medication Sig  .  albuterol (PROVENTIL HFA;VENTOLIN HFA) 108 (90 Base) MCG/ACT inhaler Inhale 2 puffs into the lungs every 4 (four) hours as needed for wheezing. As a RESCUE medication  . amitriptyline (ELAVIL) 50 MG tablet TAKE 1 TABLET (50 MG TOTAL) BY MOUTH AT BEDTIME.  [name] amLODipine (NORVASC) 5 MG tablet Take 1 tablet (5 mg total) by mouth daily. (Patient taking differently: Take 5 mg by mouth every evening. )  . aspirin 81 MG tablet  Take 81 mg by mouth every evening.   [name] atorvastatin (LIPITOR) 80 MG tablet TAKE 1 TABLET EVERY DAY. PLEASE KEEP UPCOMING APPT.THANK YOU  . B Complex Vitamins (VITAMIN-B COMPLEX PO) Take 1 tablet by mouth daily.   . bisacodyl (DULCOLAX) 5 MG EC tablet Take 1 tablet (5 mg total) by mouth daily as needed for moderate constipation. (Patient taking differently: Take 5 mg by mouth daily. )  . buPROPion (WELLBUTRIN SR) 150 MG 12 hr tablet Take 1 tablet (150 mg total) by mouth 2 (two) times daily.  . Calcium Carbonate-Vitamin D (CALCIUM + D PO) Take 600 mg by mouth daily.   . cilostazol (PLETAL) 100 MG tablet 1 TABLET BY MOUTH TWICE A DAY PLEASE CALL/ SCHEDULE 1 YEAR FOLLOW UP APPOINTMENT FOR FURTHER REFILLS  . fluticasone (FLONASE) 50 MCG/ACT nasal spray USE 2 SPRAYS IN BOTH NOSTRILS DAILY.  [name] Fluticasone-Salmeterol (ADVAIR DISKUS) 100-50 MCG/DOSE AEPB Inhale 1 puff into the lungs 2 (two) times daily.  [name] ipratropium-albuterol (DUONEB) 0.5-2.5 (3) MG/3ML SOLN Take 3 mLs by nebulization every 6 (six) hours as needed. Use 3 times daily x 3 days, then every 6 hours as needed.  [name] levothyroxine (SYNTHROID, LEVOTHROID) 112 MCG tablet TAKE ONE TABLET BY MOUTH ONE TIME DAILY  . LORazepam (ATIVAN) 2 MG tablet TAKE 1 TABLET BY MOUTH AT BEDTIME AS NEEDED FOR ANXIETY  . metoprolol tartrate (LOPRESSOR) 25 MG tablet TAKE 1 TABLET BY MOUTH 2 TIMES DAILY.  [name] omeprazole (PRILOSEC) 40 MG capsule TAKE 1 CAPSULE (40 MG TOTAL) BY MOUTH 2 (TWO) TIMES DAILY.  [name] omeprazole (PRILOSEC) 40 MG capsule Take 1 capsule (40 mg total) by mouth 2 (two) times daily.   No facility-administered encounter medications on file as of 10/26/2016.     Allergies as of 10/26/2016 - Review Complete 09/03/2016  Allergen Reaction Noted  . Sulfonamide derivatives Swelling 09/08/2007    Past Medical History:  Diagnosis Date  . Cancer ([place]) 2010   invasive ductal breast cancer; on tamoxifen  . Complication of anesthesia    was in ICU after lung  surgery 2012  . COPD (chronic obstructive pulmonary disease) ([place])   . Diverticulitis    required colcectomy  . Emphysema of lung ([place])   . Fibromyalgia 1995  . GERD (gastroesophageal reflux disease)   . Hearing loss of both ears    uses hearing aids  bilaterally  . Hx of colonic polyps    Dr. Cristina Gong -last study '11  . Hyperlipidemia   . Hypertension   . Hypothyroidism   . Osteoporosis   . Peripheral vascular disease [facility]) May '12 - CT angio   SMA chronic occlusion; stenosis of celiac trunk.  . Shortness of breath dyspnea   . Varicose veins of leg with swelling    varicose vein surgery - Dr. Aleda Grana  . Wears glasses     Past Surgical History:  Procedure Laterality Date  . ABDOMINAL HYSTERECTOMY  1973   metorrhagia  . APPENDECTOMY    . BREAST SURGERY  2010   mastectomy with  reconstructive surgery  . BREAST SURGERY  2010   rt lump x2  . BREAST SURGERY  2010   partial rt br reduction  . CARDIAC CATHETERIZATION  '11   no obstructive coronary disease  . COLECTOMY  2002   sigmoid  . COLONOSCOPY W/ POLYPECTOMY    . LUNG SURGERY  9/12   rt mid lung wedge  . PARTIAL MASTECTOMY WITH NEEDLE LOCALIZATION Left 02/23/2013   Procedure:  LEFT PARTIAL MASTECTOMY WITH NEEDLE LOCALIZATION;  Surgeon: Adin Hector, MD;  Location: [facility];  Service: General;  Laterality: Left;  . [place]   right upper lobe of lung as part of VAT-nodule/benign    Family History  Problem Relation Age of Onset  . Colon cancer Mother     colon  . COPD Mother     brown lung  . Hypertension Mother   . Diabetes Mother   . Emphysema Mother   . Coronary artery disease Father   . Heart attack Father   . Sudden death Father   . Heart disease Father   . Cancer Sister     fallopian tube  . Hypertension Sister   . Coronary artery disease Brother   . Throat cancer Sister   . Melanoma Sister   . Hypertension Sister   . Pancreatic cancer Sister   . Cancer Sister    . Heart attack Brother     early 64's  . Heart failure Sister     Social History   Social History  . Marital status: Married    Spouse name: N/A  . Number of children: 3  . Years of education: 12   Occupational History  . beautician Retired    retired   Social History Main Topics  . Smoking status: Former Smoker    Packs/day: 0.75    Years: 50.00    Types: Cigarettes    Quit date: 03/18/2016  . Smokeless tobacco: Never Used  . Alcohol use 6.0 oz/week    10 Cans of beer per week     Comment: occ  . Drug use: No  . Sexual activity: Not Currently   Other Topics Concern  . Not on file   Social History Narrative   HSG, beauty school. Married '69 -33 yrs/widowed; married '79 - 3yrdivorced; married '87.  2 sons - '70, '74, 1 srtep-son; 1 grandchild. Work - bInsurance claims handler40 yrs, retired. SO - good health.  NO history of abuse.  ACP - full code and all heroic measures.      Review of systems: Review of Systems  Constitutional: Negative for fever and chills.  HENT: Negative.   Eyes: Negative for blurred vision.  Respiratory: as per HPI  Cardiovascular: Negative for chest pain and palpitations.  Gastrointestinal: Negative for vomiting, diarrhea, blood per rectum. Genitourinary: Negative for dysuria, urgency, frequency and hematuria.  Musculoskeletal: Negative for myalgias, back pain and joint pain.  Skin: Negative for itching and rash.  Neurological: Negative for dizziness, tremors, focal weakness, seizures and loss of consciousness.  Endo/Heme/Allergies: Negative for environmental allergies.  Psychiatric/Behavioral: Negative for depression, suicidal ideas and hallucinations.  All other systems reviewed and are negative.   Physical Exam: There were no vitals taken for this visit. Gen:      No acute distress HEENT:  EOMI, sclera anicteric Neck:     No masses; no thyromegaly Lungs:    Exp wheeze B/L; normal respiratory effort CV:  Regular rate and rhythm; no  murmurs Abd:      + bowel sounds; soft, non-tender; no palpable masses, no distension Ext:    No edema; adequate peripheral perfusion Skin:      Warm and dry; no rash Neuro: alert and oriented x 3 Psych: normal mood and affect  Data Reviewed: EKG x 3- Low voltage, possible anteroseptal infarct. Unchanged compared to 07/04/16 CXR 07/07/16- Findings of COPD, bronchits, no acute changes.  Assessment:  PI OVERSIGHT ATTESTATION  I the Sub Investigator (PI) for the above mentioned study attest that I reviewed the above mentioned notes on research subject  [name]  born 05-27-1946 . I  agree with the findings mentioned.  Patient examined for screening visit. On examination she has significant wheeze on auscultation. I have referred her to be seen by her NP who has deemed her to have COPD exacerbation. This is deemed a screen failure and we will follow protocol for this as agreed before.   1. Scientific Purpose  Clinical research is designed to produce generalizable knowledge and to answer questions about the safety and efficacy of intervention(s) under study in order to determine whether or not they may be useful for the care of future patients.  2. Study Procedures  Participation in a trial may involve procedures or tests, in addition to the intervention(s) under study, that are intended only or primarily to generate scientific knowledge and that are otherwise not necessary for patient care.   3. Uncertainty  For intervention(s) under study in clinical research, there often is less knowledge and more uncertainty about the risks and benefits to a population of trial participants than there is when a doctor offers a patient standard interventions.   4. Adherence to Protocol  Administration of the intervention(s) under study is typically based on a strict protocol with defined dose, scheduling, and use or avoidance of concurrent medications, compared to administration of standard  interventions.  5. Clinician as Investigator  Clinicians who are in health care settings provide treatment; in a clinical trial setting, they are also investigating safety and efficacy of an intervention. In otherwise your doctor or nurse practitioner can be wearing 2 hats - one as care giver another as Company secretary  6. Patient as Visual merchandiser Subject  Patients participating in research trials are research subjects or volunteers. In other words participating in research is 100% voluntary and at one's own free weill. The decision to participate or not participate will NOT affect patient care and the doctor-patient relationship in any way  7. Financial Conflict of Interest Disclosure  One or more of the investigators of  the research trial might an investment interest in [employer], [facility] the clinical trials site and is both the company and the investigators are being compensated for their effort in trial research activities.     Marshell Garfinkel MD [facility] Pager [phone] If no answer or after 3pm call: [phone] 10/27/2016, 10:02 PM

## 2016-10-26 NOTE — Progress Notes (Signed)
$'@Patient'q$  ID: [name], female    DOB: [birth year], 71 y.o.   MRN: [id number]  Chief Complaint  Patient presents with  . Acute Visit    COPD     Referring provider: Hoyt Koch, *  HPI: 71 year old female former smoker seen for pulmonary consult 08/09/2016 for COPD  TEST  CT chest on 08/23/2016 that showed a resolving pneumonia in the left upper lobe with residual pleural thickening along the oblique fissure. Small nonspecific pulmonary nodule in the lower lobes measuring 4 mm. PFT done on  November 2 showed moderate COPD with an FEV1 at 56%, ratio 52, FVC 83%, no significant bronchodilator response, DLCO 52%.  10/26/2016 Acute OV : COPD  Pt presents for an acute office visit. She complains over last 3 weeks cough, wheezing, shortness of breath//doe. No fever, discolored mucus . Cough is minimally productive with white /clear mucus.  Taking mucinex DM Twice daily  With some help.  Taking Advair Twice daily   And Duoneb Three times a day  . Denies missed doses .   Was suppose to have ONO but has not been done . This was being done to see if Oxygen At bedtime  Was still needed. Off daytime oxygen .     Allergies  Allergen Reactions  . Sulfonamide Derivatives Swelling    Immunization History  Administered Date(s) Administered  . Influenza,inj,Quad PF,36+ Mos 08/10/2014, 08/12/2015, 07/03/2016  . Pneumococcal Conjugate-13 08/10/2014  . Pneumococcal Polysaccharide-23 08/12/2015  . Td 10/16/2007  . Zoster 11/09/2010    Past Medical History:  Diagnosis Date  . Cancer ([place]) 2010   invasive ductal breast cancer; on tamoxifen  . Complication of anesthesia    was in ICU after lung surgery 2012  . COPD (chronic obstructive pulmonary disease) ([place])   . Diverticulitis    required colcectomy  . Emphysema of lung ([place])   . Fibromyalgia 1995  . GERD (gastroesophageal reflux disease)   . Hearing loss of both ears    uses hearing aids  bilaterally  . Hx of colonic  polyps    Dr. Cristina Gong -last study '11  . Hyperlipidemia   . Hypertension   . Hypothyroidism   . Osteoporosis   . Peripheral vascular disease [facility]) May '12 - CT angio   SMA chronic occlusion; stenosis of celiac trunk.  . Shortness of breath dyspnea   . Varicose veins of leg with swelling    varicose vein surgery - Dr. Aleda Grana  . Wears glasses     Tobacco History: History  Smoking Status  . Former Smoker  . Packs/day: 0.75  . Years: 50.00  . Types: Cigarettes  . Quit date: 03/18/2016  Smokeless Tobacco  . Never Used   Counseling given: Not Answered   Outpatient Encounter Prescriptions as of 10/26/2016  Medication Sig  . albuterol (PROVENTIL HFA;VENTOLIN HFA) 108 (90 Base) MCG/ACT inhaler Inhale 2 puffs into the lungs every 4 (four) hours as needed for wheezing. As a RESCUE medication  . amitriptyline (ELAVIL) 50 MG tablet TAKE 1 TABLET (50 MG TOTAL) BY MOUTH AT BEDTIME.  Marland Kitchen amLODipine (NORVASC) 5 MG tablet Take 1 tablet (5 mg total) by mouth daily. (Patient taking differently: Take 5 mg by mouth every evening. )  . aspirin 81 MG tablet Take 81 mg by mouth every evening.   Marland Kitchen atorvastatin (LIPITOR) 80 MG tablet TAKE 1 TABLET EVERY DAY. PLEASE KEEP UPCOMING APPT.THANK YOU  . B Complex Vitamins (VITAMIN-B COMPLEX PO) Take 1 tablet by mouth  daily.   . bisacodyl (DULCOLAX) 5 MG EC tablet Take 1 tablet (5 mg total) by mouth daily as needed for moderate constipation. (Patient taking differently: Take 5 mg by mouth daily. )  . buPROPion (WELLBUTRIN SR) 150 MG 12 hr tablet Take 1 tablet (150 mg total) by mouth 2 (two) times daily.  . Calcium Carbonate-Vitamin D (CALCIUM + D PO) Take 600 mg by mouth daily.   . cilostazol (PLETAL) 100 MG tablet 1 TABLET BY MOUTH TWICE A DAY PLEASE CALL/ SCHEDULE 1 YEAR FOLLOW UP APPOINTMENT FOR FURTHER REFILLS  . fluticasone (FLONASE) 50 MCG/ACT nasal spray USE 2 SPRAYS IN BOTH NOSTRILS DAILY.  Marland Kitchen Fluticasone-Salmeterol (ADVAIR DISKUS) 100-50 MCG/DOSE AEPB  Inhale 1 puff into the lungs 2 (two) times daily.  Marland Kitchen ipratropium-albuterol (DUONEB) 0.5-2.5 (3) MG/3ML SOLN Take 3 mLs by nebulization every 6 (six) hours as needed. Use 3 times daily x 3 days, then every 6 hours as needed.  Marland Kitchen levothyroxine (SYNTHROID, LEVOTHROID) 112 MCG tablet TAKE ONE TABLET BY MOUTH ONE TIME DAILY  . LORazepam (ATIVAN) 2 MG tablet TAKE 1 TABLET BY MOUTH AT BEDTIME AS NEEDED FOR ANXIETY  . metoprolol tartrate (LOPRESSOR) 25 MG tablet TAKE 1 TABLET BY MOUTH 2 TIMES DAILY.  Marland Kitchen omeprazole (PRILOSEC) 40 MG capsule TAKE 1 CAPSULE (40 MG TOTAL) BY MOUTH 2 (TWO) TIMES DAILY.  Marland Kitchen predniSONE (DELTASONE) 10 MG tablet 4 tabs for 2 days, then 3 tabs for 2 days, 2 tabs for 2 days, then 1 tab for 2 days, then stop  . [DISCONTINUED] omeprazole (PRILOSEC) 40 MG capsule Take 1 capsule (40 mg total) by mouth 2 (two) times daily.   No facility-administered encounter medications on file as of 10/26/2016.      Review of Systems  Constitutional:   No  weight loss, night sweats,  Fevers, chills, fatigue, or  lassitude.  HEENT:   No headaches,  Difficulty swallowing,  Tooth/dental problems, or  Sore throat,                No sneezing, itching, ear ache,  +nasal congestion, post nasal drip,   CV:  No chest pain,  Orthopnea, PND, swelling in lower extremities, anasarca, dizziness, palpitations, syncope.   GI  No heartburn, indigestion, abdominal pain, nausea, vomiting, diarrhea, change in bowel habits, loss of appetite, bloody stools.   Resp:    No chest wall deformity  Skin: no rash or lesions.  GU: no dysuria, change in color of urine, no urgency or frequency.  No flank pain, no hematuria   MS:  No joint pain or swelling.  No decreased range of motion.  No back pain.    Physical Exam  BP 110/70   Pulse 72   Temp 97.7 F (36.5 C) (Oral)   Ht '5\' 5"'$  (1.651 m)   Wt 150 lb (68 kg)   SpO2 91%   BMI 24.96 kg/m   GEN: A/Ox3; pleasant , NAD, elderly    HEENT:  [place]/AT,  EACs-clear,  TMs-wnl, NOSE-clear, THROAT-clear, no lesions, no postnasal drip or exudate noted.   NECK:  Supple w/ fair ROM; no JVD; normal carotid impulses w/o bruits; no thyromegaly or nodules palpated; no lymphadenopathy.    RESP  Few trace wheezes  No accessory muscle use, no dullness to percussion  CARD:  RRR, no m/r/g, no peripheral edema, pulses intact, no cyanosis or clubbing.  GI:   Soft & nt; nml bowel sounds; no organomegaly or masses detected.   Musco: Warm bil, no  deformities or joint swelling noted.   Neuro: alert, no focal deficits noted.    Skin: Warm, no lesions or rashes  Psych:  No change in mood or affect. No depression or anxiety.  No memory loss.   Assessment & Plan:   Chronic obstructive pulmonary disease ([place]) Flare with URI /AR   Plan  Patient Instructions  Prednisone taper over next week .  Mucinex DM Twice daily  As needed  Cough/congestion  Continue on Advair and Duoneb  Will set up ONO for oxygen test.  Follow up with Dr. Chase Caller in 3 months and As needed   Please contact office for sooner follow up if symptoms do not improve or worsen or seek emergency care      Acute on chronic respiratory failure with hypoxia ([place]) Set up ONO on At bedtime  On RA to determine if O2 is still required      Rexene Edison, NP 10/26/2016

## 2016-10-26 NOTE — Telephone Encounter (Signed)
[name] was in clinic this afternoon and spoke to [name] directly regarding this issue.  Nothing further is needed.

## 2016-10-26 NOTE — Assessment & Plan Note (Signed)
Flare with URI /AR   Plan  Patient Instructions  Prednisone taper over next week .  Mucinex DM Twice daily  As needed  Cough/congestion  Continue on Advair and Duoneb  Will set up ONO for oxygen test.  Follow up with Dr. Chase Caller in 3 months and As needed   Please contact office for sooner follow up if symptoms do not improve or worsen or seek emergency care

## 2016-10-26 NOTE — Progress Notes (Signed)
Title: Randomised, Double-Blind (Sponsor Open), Placebo-Controlled, Multicentre, Dose Ranging Study to Evaluate the Efficacy and Safety of Danirixin Tablets Administered Twice Daily Compared With Placebo for 24 Weeks in Adults With COPD  Sponsor: [employer] , Protoocol Number: [id number], NCT Trial #: [id number]  Synopsis:  Following baseline assessments collected over a 7 day period participants will be randomized (1:1:1:1:1:1) to receive one of five dose strengths of danirixin (5 milligram [mg], 10 mg, 25 mg, 35 mg and 50 mg) or placebo. Study treatment will be administered orally twice daily for 24 weeks. Participants will continue with their standard of care inhaled medications during study. Follow up will continue up to 28 days post last dose  Key Inclusion Criteria: age 7-80, COPD with fev1 >/= 40%, ratio <0.7, >/= 2 copd exacerbations in past 1 year treated with either antibiotics or steroids, but >/= 14 days since last antibiotic or steroid for exacerbation, >/= 10 pack smoking history  Key Exclusion Criteria: non-COPD lung disease, pulse ox < 88% at rest on room air (i.e, restting o2 patients excluded), active phase of pulmonary rehabilitation (maintenance ok), previous lung surgery, QTc prolongation, HIV positive etc..   Key end points: Changes in respiratory symptoms and copd exacerbations and safety of DNX  Key features of DNX:   selective CXC chemokine receptor (CXCR2) antagonist . Positive trends in COPD exacerbation, symptoms and health status  Safety of DNX: As of April 2017 (5 completed Phase 1 studies, and 1 Phase 2A study of moderate copd patients with 45 patients):Marland Kitchen Most side effects are seen at '100mg'$  bid (current study max is '50mg'$  bid)  Phase 1 Normal Adults - dose </= '50mg'$  bid Study drug -    placebo  General All side effects mild - moderate. No changes in labs, EKG and vitals including PMN per PI meeting slide  x  Any adverse event 30% 13%  Headache 10-20% 0-17%   Fatigue/Lethargy 0-10% 0-3%  Abd Pain 0-11% (20% at '200mg'$  bid) 0%   Cough 0% 0%  Study withdrawal 0% 0%  SAE 0% 0%   - 3. COPD  COPD patients -data from IB April 2017 and PI meeting Spring 2017 Study drug Placebo  Efficacy  AECOPD and CAT scores better with drug after 1 year x  General No change in PMN, EKG, Vitals, Labs, Spirometry but produces mild to moderate headache and diarrhea  x  Any AE 52-77% 55-80%  Cystitis 5% at '75mg'$  bid 3%  Backpain 5% at '75mg'$  bid 0%  Nasopharyngitis 29% at '75mg'$  bid 33%  Headache 9% at '75mg'$  bid 8%  Upper abdominal pain 0% at '75mg'$  bid 6%  Infection rate 42% 50%  SAE 10 events 31 events   ........................................................................................................................................................................ Clinical Research Coordinator / Research RN note : This visit for Subject [name] with DOB 1946/02/11 on 10/26/2016 for the above protocol is Visit0 and Visit 1  and is for purpose of research . The consent for this encounter is under Protocol Version Amendment 1 and is currently IRB approved. Subject expressed continued interest and consent in continuing as a study subject. Subject thanked for participation.  See note by Doreatha Martin, RN for documentation on the consenting process.   This coordinator, after consent process completed, obtained vital signs, ECG, spirometry, conmeds, medical history, smoking status, and laboratory assessments per protocol requirements. Please refer to subjects paper source binder for records.   In this visit 10/26/2016 the subject will be evaluated by investigator named Dr. Marshell Garfinkel . This research coordinator has  verified that the investigator is uptodate with his/her training logs. A full physical exam was performed, refer to subjects paper source binder for report. During the exam, Dr. Vaughan Browner advised the subject was wheezing and recommended the subject see a  clinician to address this issue after the completion of the research visit.   The subject was later evaluated by Rexene Edison, NP on 10/26/2016 for acute standard of care visit. Please refer to her progress note for documentation of that visit.    Signed by  Dayton Bing, CMA, Clinical Research Coordinator [facility]  [place], [place] 1:25 PM 10/26/2016

## 2016-10-26 NOTE — Telephone Encounter (Signed)
Pt aware. This has been collected today 10/26/16 at visit. Nothing further needed at this time.

## 2016-10-26 NOTE — Assessment & Plan Note (Signed)
Set up ONO on At bedtime  On RA to determine if O2 is still required

## 2016-10-26 NOTE — Patient Instructions (Addendum)
Prednisone taper over next week .  Mucinex DM Twice daily  As needed  Cough/congestion  Continue on Advair and Duoneb  Will set up ONO for oxygen test.  Follow up with Dr. Chase Caller in 3 months and As needed   Please contact office for sooner follow up if symptoms do not improve or worsen or seek emergency care

## 2016-10-27 ENCOUNTER — Encounter: Payer: Self-pay | Admitting: Pulmonary Disease

## 2016-10-30 ENCOUNTER — Other Ambulatory Visit: Payer: Self-pay | Admitting: Internal Medicine

## 2016-11-01 LAB — ALPHA-1 ANTITRYPSIN PHENOTYPE: A-1 Antitrypsin: 169 mg/dL (ref 83–199)

## 2016-11-02 NOTE — Progress Notes (Signed)
Called and spoke to pt. Informed her of the results per MR. Pt verbalized understanding and denied any further questions or concerns at this time.  

## 2016-11-06 DIAGNOSIS — J449 Chronic obstructive pulmonary disease, unspecified: Secondary | ICD-10-CM | POA: Diagnosis not present

## 2016-11-07 DIAGNOSIS — J449 Chronic obstructive pulmonary disease, unspecified: Secondary | ICD-10-CM | POA: Diagnosis not present

## 2016-11-09 DIAGNOSIS — J449 Chronic obstructive pulmonary disease, unspecified: Secondary | ICD-10-CM | POA: Diagnosis not present

## 2016-11-16 ENCOUNTER — Encounter (HOSPITAL_COMMUNITY): Payer: Self-pay

## 2016-11-16 ENCOUNTER — Ambulatory Visit (INDEPENDENT_AMBULATORY_CARE_PROVIDER_SITE_OTHER): Payer: Medicare Other | Admitting: Internal Medicine

## 2016-11-16 ENCOUNTER — Telehealth: Payer: Self-pay | Admitting: Internal Medicine

## 2016-11-16 ENCOUNTER — Inpatient Hospital Stay (HOSPITAL_COMMUNITY)
Admission: AD | Admit: 2016-11-16 | Discharge: 2016-11-21 | DRG: 190 | Disposition: A | Discharge status: Home / Self Care | Payer: Medicare Other | Source: Ambulatory Visit | Attending: Internal Medicine | Admitting: Internal Medicine

## 2016-11-16 ENCOUNTER — Encounter: Payer: Self-pay | Admitting: Internal Medicine

## 2016-11-16 ENCOUNTER — Inpatient Hospital Stay (HOSPITAL_COMMUNITY): Payer: Medicare Other

## 2016-11-16 VITALS — BP 116/74 | HR 99 | Ht 65.0 in | Wt 150.0 lb

## 2016-11-16 DIAGNOSIS — Z833 Family history of diabetes mellitus: Secondary | ICD-10-CM | POA: Diagnosis not present

## 2016-11-16 DIAGNOSIS — Z7951 Long term (current) use of inhaled steroids: Secondary | ICD-10-CM

## 2016-11-16 DIAGNOSIS — J441 Chronic obstructive pulmonary disease with (acute) exacerbation: Secondary | ICD-10-CM | POA: Diagnosis not present

## 2016-11-16 DIAGNOSIS — Z87891 Personal history of nicotine dependence: Secondary | ICD-10-CM | POA: Diagnosis not present

## 2016-11-16 DIAGNOSIS — R05 Cough: Secondary | ICD-10-CM | POA: Diagnosis not present

## 2016-11-16 DIAGNOSIS — E039 Hypothyroidism, unspecified: Secondary | ICD-10-CM | POA: Diagnosis not present

## 2016-11-16 DIAGNOSIS — Z8701 Personal history of pneumonia (recurrent): Secondary | ICD-10-CM

## 2016-11-16 DIAGNOSIS — H9193 Unspecified hearing loss, bilateral: Secondary | ICD-10-CM | POA: Diagnosis not present

## 2016-11-16 DIAGNOSIS — J309 Allergic rhinitis, unspecified: Secondary | ICD-10-CM | POA: Diagnosis not present

## 2016-11-16 DIAGNOSIS — M810 Age-related osteoporosis without current pathological fracture: Secondary | ICD-10-CM | POA: Diagnosis present

## 2016-11-16 DIAGNOSIS — I10 Essential (primary) hypertension: Secondary | ICD-10-CM | POA: Diagnosis present

## 2016-11-16 DIAGNOSIS — Z90710 Acquired absence of both cervix and uterus: Secondary | ICD-10-CM

## 2016-11-16 DIAGNOSIS — Z86010 Personal history of colonic polyps: Secondary | ICD-10-CM | POA: Diagnosis not present

## 2016-11-16 DIAGNOSIS — I739 Peripheral vascular disease, unspecified: Secondary | ICD-10-CM | POA: Diagnosis present

## 2016-11-16 DIAGNOSIS — M797 Fibromyalgia: Secondary | ICD-10-CM | POA: Diagnosis not present

## 2016-11-16 DIAGNOSIS — F419 Anxiety disorder, unspecified: Secondary | ICD-10-CM | POA: Diagnosis present

## 2016-11-16 DIAGNOSIS — Z825 Family history of asthma and other chronic lower respiratory diseases: Secondary | ICD-10-CM

## 2016-11-16 DIAGNOSIS — E785 Hyperlipidemia, unspecified: Secondary | ICD-10-CM | POA: Diagnosis not present

## 2016-11-16 DIAGNOSIS — Z882 Allergy status to sulfonamides status: Secondary | ICD-10-CM | POA: Diagnosis not present

## 2016-11-16 DIAGNOSIS — R0602 Shortness of breath: Secondary | ICD-10-CM | POA: Diagnosis present

## 2016-11-16 DIAGNOSIS — R21 Rash and other nonspecific skin eruption: Secondary | ICD-10-CM | POA: Diagnosis present

## 2016-11-16 DIAGNOSIS — R918 Other nonspecific abnormal finding of lung field: Secondary | ICD-10-CM | POA: Diagnosis not present

## 2016-11-16 DIAGNOSIS — J9621 Acute and chronic respiratory failure with hypoxia: Secondary | ICD-10-CM | POA: Diagnosis present

## 2016-11-16 DIAGNOSIS — Z8249 Family history of ischemic heart disease and other diseases of the circulatory system: Secondary | ICD-10-CM | POA: Diagnosis not present

## 2016-11-16 DIAGNOSIS — B370 Candidal stomatitis: Secondary | ICD-10-CM | POA: Diagnosis present

## 2016-11-16 DIAGNOSIS — Z9981 Dependence on supplemental oxygen: Secondary | ICD-10-CM

## 2016-11-16 DIAGNOSIS — Z79899 Other long term (current) drug therapy: Secondary | ICD-10-CM

## 2016-11-16 DIAGNOSIS — K219 Gastro-esophageal reflux disease without esophagitis: Secondary | ICD-10-CM | POA: Diagnosis present

## 2016-11-16 DIAGNOSIS — Z7982 Long term (current) use of aspirin: Secondary | ICD-10-CM

## 2016-11-16 LAB — CBC WITH DIFFERENTIAL/PLATELET
Basophils Absolute: 0 10*3/uL (ref 0.0–0.1)
Basophils Relative: 0 %
Eosinophils Absolute: 1.6 10*3/uL — ABNORMAL HIGH (ref 0.0–0.7)
Eosinophils Relative: 15 %
HCT: 40.9 % (ref 36.0–46.0)
Hemoglobin: 13 g/dL (ref 12.0–15.0)
Lymphocytes Relative: 13 %
Lymphs Abs: 1.4 10*3/uL (ref 0.7–4.0)
MCH: 29.7 pg (ref 26.0–34.0)
MCHC: 31.8 g/dL (ref 30.0–36.0)
MCV: 93.4 fL (ref 78.0–100.0)
Monocytes Absolute: 0.7 10*3/uL (ref 0.1–1.0)
Monocytes Relative: 7 %
Neutro Abs: 6.8 10*3/uL (ref 1.7–7.7)
Neutrophils Relative %: 65 %
Platelets: 410 10*3/uL — ABNORMAL HIGH (ref 150–400)
RBC: 4.38 MIL/uL (ref 3.87–5.11)
RDW: 14.2 % (ref 11.5–15.5)
WBC: 10.5 10*3/uL (ref 4.0–10.5)

## 2016-11-16 LAB — BLOOD GAS, ARTERIAL
Acid-Base Excess: 0.9 mmol/L (ref 0.0–2.0)
Bicarbonate: 27.3 mmol/L (ref 20.0–28.0)
O2 Content: 3 L/min
O2 Saturation: 85.5 %
Patient temperature: 37
pCO2 arterial: 53.9 mmHg — ABNORMAL HIGH (ref 32.0–48.0)
pH, Arterial: 7.325 — ABNORMAL LOW (ref 7.350–7.450)
pO2, Arterial: 57.2 mmHg — ABNORMAL LOW (ref 83.0–108.0)

## 2016-11-16 LAB — INFLUENZA PANEL BY PCR (TYPE A & B)
Influenza A By PCR: NEGATIVE
Influenza B By PCR: NEGATIVE

## 2016-11-16 LAB — MAGNESIUM: Magnesium: 1.9 mg/dL (ref 1.7–2.4)

## 2016-11-16 LAB — TROPONIN I: Troponin I: <0.03 ng/mL (ref <0.03)

## 2016-11-16 LAB — CREATININE, SERUM
Creatinine, Ser: 0.89 mg/dL (ref 0.44–1.00)
GFR calc Af Amer: >60 mL/min (ref >60)
GFR calc non Af Amer: >60 mL/min (ref >60)

## 2016-11-16 LAB — STREP PNEUMONIAE URINARY ANTIGEN: Strep Pneumo Urinary Antigen: NEGATIVE

## 2016-11-16 LAB — PROCALCITONIN: Procalcitonin: <0.1 ng/mL

## 2016-11-16 LAB — LACTIC ACID, PLASMA: Lactic Acid, Venous: 1.6 mmol/L (ref 0.5–1.9)

## 2016-11-16 MED ORDER — AMLODIPINE BESYLATE 5 MG PO TABS
5.0000 mg | ORAL_TABLET | Freq: Every day | ORAL | Status: DC
  Administered 2016-11-16 – 2016-11-21 (×6): 5 mg via ORAL
  Filled 2016-11-16 (×6): qty 1

## 2016-11-16 MED ORDER — ASPIRIN 81 MG PO CHEW
81.0000 mg | CHEWABLE_TABLET | Freq: Every evening | ORAL | Status: DC
  Administered 2016-11-16 – 2016-11-20 (×5): 81 mg via ORAL
  Filled 2016-11-16 (×6): qty 1

## 2016-11-16 MED ORDER — SODIUM CHLORIDE 0.9 % IV SOLN: INTRAVENOUS | Status: DC | PRN

## 2016-11-16 MED ORDER — GUAIFENESIN ER 600 MG PO TB12
600.0000 mg | ORAL_TABLET | Freq: Two times a day (BID) | ORAL | Status: DC
  Administered 2016-11-16 – 2016-11-21 (×11): 600 mg via ORAL
  Filled 2016-11-16 (×11): qty 1

## 2016-11-16 MED ORDER — PANTOPRAZOLE SODIUM 40 MG PO TBEC
40.0000 mg | DELAYED_RELEASE_TABLET | Freq: Every day | ORAL | Status: DC
  Administered 2016-11-16 – 2016-11-19 (×4): 40 mg via ORAL
  Filled 2016-11-16 (×5): qty 1

## 2016-11-16 MED ORDER — VANCOMYCIN HCL 500 MG IV SOLR
500.0000 mg | Freq: Two times a day (BID) | INTRAVENOUS | Status: DC
  Administered 2016-11-17: 500 mg via INTRAVENOUS
  Filled 2016-11-16: qty 500

## 2016-11-16 MED ORDER — METOPROLOL TARTRATE 25 MG PO TABS
25.0000 mg | ORAL_TABLET | Freq: Two times a day (BID) | ORAL | Status: DC
  Administered 2016-11-16 – 2016-11-21 (×10): 25 mg via ORAL
  Filled 2016-11-16 (×11): qty 1

## 2016-11-16 MED ORDER — BUPROPION HCL ER (SR) 150 MG PO TB12
150.0000 mg | ORAL_TABLET | Freq: Two times a day (BID) | ORAL | Status: DC
  Administered 2016-11-16 – 2016-11-21 (×10): 150 mg via ORAL
  Filled 2016-11-16 (×11): qty 1

## 2016-11-16 MED ORDER — LEVALBUTEROL HCL 0.63 MG/3ML IN NEBU
0.6300 mg | INHALATION_SOLUTION | Freq: Once | RESPIRATORY_TRACT | Status: AC
  Administered 2016-11-16: 0.63 mg via RESPIRATORY_TRACT

## 2016-11-16 MED ORDER — FLUTICASONE PROPIONATE 50 MCG/ACT NA SUSP
1.0000 | Freq: Every day | NASAL | Status: DC
  Administered 2016-11-16 – 2016-11-21 (×6): 1 via NASAL
  Filled 2016-11-16: qty 16

## 2016-11-16 MED ORDER — METHYLPREDNISOLONE SODIUM SUCC 40 MG IJ SOLR
40.0000 mg | Freq: Four times a day (QID) | INTRAMUSCULAR | Status: DC
  Administered 2016-11-16 – 2016-11-18 (×8): 40 mg via INTRAVENOUS
  Filled 2016-11-16 (×8): qty 1

## 2016-11-16 MED ORDER — HEPARIN SODIUM (PORCINE) 5000 UNIT/ML IJ SOLN
5000.0000 [IU] | Freq: Three times a day (TID) | INTRAMUSCULAR | Status: DC
  Administered 2016-11-16 – 2016-11-21 (×15): 5000 [IU] via SUBCUTANEOUS
  Filled 2016-11-16 (×15): qty 1

## 2016-11-16 MED ORDER — DEXTROSE 5 % IV SOLN
2.0000 g | Freq: Once | INTRAVENOUS | Status: AC
  Administered 2016-11-16: 2 g via INTRAVENOUS
  Filled 2016-11-16: qty 2

## 2016-11-16 MED ORDER — CILOSTAZOL 100 MG PO TABS
100.0000 mg | ORAL_TABLET | Freq: Two times a day (BID) | ORAL | Status: DC
  Administered 2016-11-16 – 2016-11-21 (×11): 100 mg via ORAL
  Filled 2016-11-16 (×11): qty 1

## 2016-11-16 MED ORDER — LEVOTHYROXINE SODIUM 112 MCG PO TABS
112.0000 ug | ORAL_TABLET | Freq: Every day | ORAL | Status: DC
  Administered 2016-11-17 – 2016-11-21 (×5): 112 ug via ORAL
  Filled 2016-11-16 (×5): qty 1

## 2016-11-16 MED ORDER — ALBUTEROL SULFATE (2.5 MG/3ML) 0.083% IN NEBU
2.5000 mg | INHALATION_SOLUTION | RESPIRATORY_TRACT | Status: DC | PRN
  Administered 2016-11-19 – 2016-11-20 (×2): 2.5 mg via RESPIRATORY_TRACT
  Filled 2016-11-16 (×2): qty 3

## 2016-11-16 MED ORDER — DEXTROSE 5 % IV SOLN
1.0000 g | Freq: Three times a day (TID) | INTRAVENOUS | Status: DC
  Administered 2016-11-16 – 2016-11-17 (×2): 1 g via INTRAVENOUS
  Filled 2016-11-16 (×3): qty 1

## 2016-11-16 MED ORDER — IPRATROPIUM-ALBUTEROL 0.5-2.5 (3) MG/3ML IN SOLN
3.0000 mL | Freq: Four times a day (QID) | RESPIRATORY_TRACT | Status: DC
  Administered 2016-11-16 – 2016-11-17 (×4): 3 mL via RESPIRATORY_TRACT
  Filled 2016-11-16 (×2): qty 3
  Filled 2016-11-16: qty 42
  Filled 2016-11-16: qty 3

## 2016-11-16 MED ORDER — AMITRIPTYLINE HCL 50 MG PO TABS
50.0000 mg | ORAL_TABLET | Freq: Every day | ORAL | Status: DC
  Administered 2016-11-16 – 2016-11-20 (×5): 50 mg via ORAL
  Filled 2016-11-16 (×5): qty 1

## 2016-11-16 MED ORDER — OSELTAMIVIR PHOSPHATE 75 MG PO CAPS
75.0000 mg | ORAL_CAPSULE | Freq: Two times a day (BID) | ORAL | Status: DC
  Administered 2016-11-16: 75 mg via ORAL
  Filled 2016-11-16: qty 1

## 2016-11-16 MED ORDER — METHYLPREDNISOLONE SODIUM SUCC 40 MG IJ SOLR: 40.0000 mg | Freq: Four times a day (QID) | INTRAMUSCULAR | Status: DC

## 2016-11-16 MED ORDER — ACETAMINOPHEN 325 MG PO TABS: 650.0000 mg | ORAL_TABLET | Freq: Four times a day (QID) | ORAL | Status: DC | PRN

## 2016-11-16 MED ORDER — LORAZEPAM 1 MG PO TABS
2.0000 mg | ORAL_TABLET | Freq: Every evening | ORAL | Status: DC | PRN
  Administered 2016-11-16 – 2016-11-20 (×5): 2 mg via ORAL
  Filled 2016-11-16 (×5): qty 2

## 2016-11-16 MED ORDER — VANCOMYCIN HCL 10 G IV SOLR
1250.0000 mg | Freq: Once | INTRAVENOUS | Status: AC
  Administered 2016-11-16: 1250 mg via INTRAVENOUS
  Filled 2016-11-16: qty 1250

## 2016-11-16 MED ORDER — ATORVASTATIN CALCIUM 20 MG PO TABS
80.0000 mg | ORAL_TABLET | Freq: Every day | ORAL | Status: DC
  Administered 2016-11-16 – 2016-11-20 (×5): 80 mg via ORAL
  Filled 2016-11-16 (×5): qty 4

## 2016-11-16 MED ORDER — DEXTROSE-NACL 5-0.45 % IV SOLN
INTRAVENOUS | Status: DC
  Administered 2016-11-16 – 2016-11-19 (×4): via INTRAVENOUS

## 2016-11-16 NOTE — Telephone Encounter (Signed)
Spoke with [employer]. She is aware that the orders have been placed. Nothing further was needed.

## 2016-11-16 NOTE — Patient Instructions (Signed)
Admit see hp

## 2016-11-16 NOTE — Telephone Encounter (Signed)
MR they are calling from the hospital for this pt to have a diet order placed in epic.  Please advise once this has been done and we can call them to make them aware

## 2016-11-16 NOTE — Telephone Encounter (Signed)
Is already in there

## 2016-11-16 NOTE — H&P (Signed)
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 She has a history of pneumonia from summer 2017 and subsequent admission for COPD exacerbation in late summer or early fall 2017. Subsequent visit to the outpatient setting showed that she did not desaturate with walking and therefore oxygen was advised to be discontinued but she still carries a supply with her although she doesn't use it.. She's alpha-1 antitrypsin and MM. Most recently genital 2018 she signed up for screening visit for a COPD exacerbation study. At the time of consenting she was noticed to be in COPD exacerbation with wheezing and was subsequently discharged with outpatient antibiotics/prednisone. She says it's somewhat helped. For the last few to several days she's had declining cough, wheezing, shortness of breath, mild yellow sputum but no fever or other flulike symptoms. She's been using her oxygen on subjective basis. She does not know if a pulse oximeter is accurate. She came to the office today with extreme class IV dyspnea and significant wheezing and she had we directly admitted. She denies any chest pain  Lab review shows that she had a eosinophil count in late 2017.  CT chest NOv9 2017 showed improving left upper lobe pneumonia   Echocardiogram September 2017 with normal ejection fraction  Results for [name], [name] (MRN [id number]) as of 11/16/2016 14:43  Ref. Range 10/26/2016 07:04  FEV1-Post Latest Units: L 1.03  FEV1-%Pred-Post Latest Units: % 44  FEV1-%Change-Post Latest Units: % 22    PAST MEDICAL HISTORY :  She  has a past medical history of Cancer ([place]) (2010); Complication of anesthesia; COPD (chronic  obstructive pulmonary disease) ([place]); Diverticulitis; Emphysema of lung ([place]); Fibromyalgia (1995); GERD (gastroesophageal reflux disease); Hearing loss of both ears; colonic polyps; Hyperlipidemia; Hypertension; Hypothyroidism; Osteoporosis; Peripheral vascular disease [facility]) (May '12 - CT angio); Shortness of breath dyspnea; Varicose veins of leg with swelling; and Wears glasses.  PAST SURGICAL HISTORY: She  has a past surgical history that includes Appendectomy; Wedge resection (1993); Colonoscopy w/ polypectomy; Abdominal hysterectomy (1973); Colectomy (2002); Cardiac catheterization ('11); Breast surgery (2010); Breast surgery (2010); Breast surgery (2010); Lung surgery (9/12); and Partial mastectomy with needle localization (Left, 02/23/2013).  Allergies  Allergen Reactions  . Sulfonamide Derivatives Swelling    Swelling in face    No current facility-administered medications on file prior to encounter.    Current Outpatient Prescriptions on File Prior to Encounter  Medication Sig  . albuterol (PROVENTIL HFA;VENTOLIN HFA) 108 (90 Base) MCG/ACT inhaler Inhale 2 puffs into the lungs every 4 (four) hours as needed for wheezing. As a RESCUE medication  . amitriptyline (ELAVIL) 50 MG tablet TAKE 1 TABLET (50 MG TOTAL) BY MOUTH AT BEDTIME.  Marland Kitchen amLODipine (NORVASC) 5 MG tablet TAKE 1 TABLET (5 MG TOTAL) BY MOUTH DAILY.  Marland Kitchen aspirin 81 MG tablet Take 81 mg by mouth every evening.   Marland Kitchen atorvastatin (LIPITOR) 80 MG tablet TAKE 1 TABLET EVERY DAY. PLEASE KEEP UPCOMING APPT.THANK YOU  . bisacodyl (DULCOLAX) 5 MG EC tablet Take 1 tablet (5 mg total) by mouth daily as needed for moderate constipation. (Patient taking differently: Take 5 mg by mouth daily. )  . buPROPion (WELLBUTRIN SR) 150 MG 12 hr tablet Take  1 tablet (150 mg total) by mouth 2 (two) times daily.  . cilostazol (PLETAL) 100 MG tablet 1 TABLET BY MOUTH TWICE A DAY PLEASE CALL/ SCHEDULE 1 YEAR FOLLOW UP APPOINTMENT FOR FURTHER REFILLS  .  fluticasone (FLONASE) 50 MCG/ACT nasal spray USE 2 SPRAYS IN BOTH NOSTRILS DAILY.  Marland Kitchen Fluticasone-Salmeterol (ADVAIR DISKUS) 100-50 MCG/DOSE AEPB Inhale 1 puff into the lungs 2 (two) times daily.  Marland Kitchen ipratropium-albuterol (DUONEB) 0.5-2.5 (3) MG/3ML SOLN Take 3 mLs by nebulization every 6 (six) hours as needed. Use 3 times daily x 3 days, then every 6 hours as needed.  Marland Kitchen levothyroxine (SYNTHROID, LEVOTHROID) 112 MCG tablet TAKE ONE TABLET BY MOUTH ONE TIME DAILY  . LORazepam (ATIVAN) 2 MG tablet TAKE 1 TABLET BY MOUTH AT BEDTIME AS NEEDED FOR ANXIETY  . metoprolol tartrate (LOPRESSOR) 25 MG tablet TAKE 1 TABLET BY MOUTH 2 TIMES DAILY.  Marland Kitchen omeprazole (PRILOSEC) 40 MG capsule TAKE 1 CAPSULE (40 MG TOTAL) BY MOUTH 2 (TWO) TIMES DAILY.    FAMILY HISTORY:  Her indicated that her mother is deceased. She indicated that her father is deceased. She indicated that four of her six sisters are alive. She indicated that only one of her two brothers is alive. She indicated that her maternal grandmother is deceased. She indicated that her maternal grandfather is deceased. She indicated that her paternal grandmother is deceased. She indicated that her paternal grandfather is deceased.    SOCIAL HISTORY: She  reports that she quit smoking about 7 months ago. Her smoking use included Cigarettes. She has a 37.50 pack-year smoking history. She has never used smokeless tobacco. She reports that she drinks about 6.0 oz of alcohol per week . She reports that she does not use drugs.  REVIEW OF SYSTEMS:   All systems reviewed and is negative otherwise as stated in history of present illness   VITAL SIGNS: BP 116/72 (BP Location: Left Arm)   Pulse 86   Temp 98.1 F (36.7 C) (Oral)   Resp 20   Ht '5\' 5"'$  (1.651 m)   Wt 68 kg (150 lb)   SpO2 99%   BMI 24.96 kg/m   HEMODYNAMICS:    VENTILATOR SETTINGS:    INTAKE / OUTPUT: No intake/output data recorded.  PHYSICAL EXAMINATION: General:  Thin female sitting in  the office room. Seems to be in somewhat of respiratory distress Neuro:  Alert and oriented 3. Speech normal. Gait normal HEENT:  Supple neck. No neck nodes. No oral thrush. Pupils equal and reactive to light. No elevated JVP oxygen on Cardiovascular:  Regular rate and rhythm no murmurs Lungs:  Bilateral significant wheezing with mild to moderate tachypnea and mild accessory muscle use inability to finish full sentences. Visibly dyspneic Abdomen:  Soft nontender no organomegaly Musculoskeletal:  No cyanosis no clubbing no edema Skin:  Dry and wrinkled and appears intact in the exposed areas  LABS:  PULMONARY No results for input(s): PHART, PCO2ART, PO2ART, HCO3, TCO2, O2SAT in the last 168 hours.  Invalid input(s): PCO2, PO2  CBC  Recent Labs Lab 11/16/16 1320  HGB 13.0  HCT 40.9  WBC 10.5  PLT 410*    COAGULATION No results for input(s): INR in the last 168 hours.  CARDIAC  No results for input(s): TROPONINI in the last 168 hours. No results for input(s): PROBNP in the last 168 hours.   CHEMISTRY No results for input(s): NA, K, CL, CO2, GLUCOSE, BUN, CREATININE, CALCIUM, MG, PHOS in the last 168 hours. CrCl cannot be  calculated (Patient's most recent lab result is older than the maximum 21 days allowed.).   LIVER No results for input(s): AST, ALT, ALKPHOS, BILITOT, PROT, ALBUMIN, INR in the last 168 hours.   INFECTIOUS No results for input(s): LATICACIDVEN, PROCALCITON in the last 168 hours.   ENDOCRINE CBG (last 3)  No results for input(s): GLUCAP in the last 72 hours.       IMAGING x48h  - image(s) personally visualized  -   highlighted in bold Dg Chest Port 1 View  Result Date: 11/16/2016 CLINICAL DATA:  COPD exacerbation.  Worsening cough. EXAM: PORTABLE CHEST 1 VIEW COMPARISON:  07/07/2016 chest radiograph. FINDINGS: Surgical sutures overlie the right upper lung. Surgical clips overlie the right chest and lateral right breast. Stable cardiomediastinal  silhouette with normal heart size and aortic atherosclerosis. No pneumothorax. No significant pleural effusion (small portions of the costophrenic angles are excluded from the image). Emphysema. No pulmonary edema. No acute consolidative airspace disease. IMPRESSION: 1. Emphysema.  No acute cardiopulmonary disease. 2. Aortic atherosclerosis. Electronically Signed   By: Ilona Sorrel M.D.   On: 11/16/2016 14:28       ASSESSMENT / PLAN:  PULMONARY A: Baseline: Advanced COPD with alpha 1 mm Acute on chronic hypoxemic respiratory failure secondary to COPD exacerbation Unclear cause of recurrent COPD exacerbation P:   Check respiratory virus panel and influenza panel Empiric Tamiflu till the results are back Check pro calcitonin, urine strep and start empiric antibiotics - low threshold to discontinue Check IgE hopefully before IV steroids and started Aggressive bronchodilation and IV steroids Oxygen for pulse ox greater than 88%  CARDIOVASCULAR A:  At risk for non-STEMI P:  Check cardiac enzymes  RENAL A:   Normal renal function September 2017 P:   D5 half-normal saline maintenance  GASTROINTESTINAL A:   Nil acute P:   Heart healthy diet Protonix  HEMATOLOGIC A:   Nil acute P:  Heparin DVT prophylaxis  INFECTIOUS A:   Rule out hospital-acquired pneumonia P:   Empiric antibiotics Check sepsis biomarkers  ENDOCRINE A:   Nil acute  P:   monitor  NEUROLOGIC A:   Intact, anxiety + P:   baesline home meds  FAMILY  - Updates: poatinet updated at admit 11/16/2016  Dr. Brand Males, M.D., F.C.C.P Pulmonary and Critical Care Medicine Staff Physician [facility] Pager: [phone], If no answer or between  15:00h - 7:00h: call [phone]  11/16/2016 2:53 PM

## 2016-11-16 NOTE — Progress Notes (Addendum)
Pharmacy Antibiotic Note  [name] is a 71 y.o. female admitted on 11/16/2016 with pneumonia.  Pharmacy has been consulted for vancomycin and cefepime dosing.  Plan: Vancomycin '1250mg'$  x 1. Cefepime 2g x 1. Order SCr to determine maintenance doses.  Will place orders now to start maintenance doses for normal renal function with Vancomycin 500 mg IV q12h and Cefepime 1g IV q8h.  Will f/u STAT SCr and adjust these doses if any degree of AKI.    No data recorded.  No results for input(s): WBC, CREATININE, LATICACIDVEN, VANCOTROUGH, VANCOPEAK, VANCORANDOM, GENTTROUGH, GENTPEAK, GENTRANDOM, TOBRATROUGH, TOBRAPEAK, TOBRARND, AMIKACINPEAK, AMIKACINTROU, AMIKACIN in the last 168 hours.  CrCl cannot be calculated (Patient's most recent lab result is older than the maximum 21 days allowed.).    Allergies  Allergen Reactions  . Sulfonamide Derivatives Swelling    Swelling in face    Antimicrobials this admission: 2/2 Vancomycin >>  2/2 Cefepime >>   Dose adjustments this admission:  Microbiology results: 2/2 BCx: sent 2/2 Sputum: sent  2/2 influenza panel: IP 2/2 strep pneumo ur ag:   Thank you for allowing pharmacy to be a part of this patient's care.  Hershal Coria 11/16/2016 1:37 PM

## 2016-11-16 NOTE — Progress Notes (Signed)
Subjective:     Patient ID: [name], female   DOB: 1946/05/19, 71 y.o.   MRN: [id number]  HPI   OV 11/16/2016  Chief Complaint  Patient presents with  . Acute Visit    Coughing for the past few weeks. Non-productive cough. Increased SOB.     Results for JORDY, HEWINS (MRN [id number]) as of 11/16/2016 11:48  Ref. Range 01/05/2014 13:21 01/13/2014 13:14 02/17/2015 12:49 08/12/2015 13:51 05/04/2016 13:55 05/05/2016 04:36 07/02/2016 12:35 07/03/2016 02:56 07/04/2016 05:26 07/05/2016 03:23 07/10/2016 05:13  Eosinophils Absolute Latest Ref Range: 0.0 - 0.7 K/uL 0.3 0.3 0.4  0.1  0.9 (H)      Results for CALEDONIA, ZOU (MRN [id number]) as of 11/16/2016 11:48  Ref. Range 08/16/2016 13:46 10/26/2016 07:04  FEV1-Post Latest Units: L 1.37 1.03  FEV1-%Pred-Post Latest Units: % 56 44  FEV1-%Change-Post Latest Units: % 0 22  Post FEV1/FVC ratio Latest Units: % 52 42   Results for BERYLE, BAGSBY (MRN [id number]) as of 11/16/2016 11:48  Ref. Range 08/16/2016 13:46  DLCO cor Latest Units: ml/min/mmHg 13.66  DLCO cor % pred Latest Units: % 50    has a past medical history of Cancer ([place]) (2010); Complication of anesthesia; COPD (chronic obstructive pulmonary disease) ([place]); Diverticulitis; Emphysema of lung ([place]); Fibromyalgia (1995); GERD (gastroesophageal reflux disease); Hearing loss of both ears; colonic polyps; Hyperlipidemia; Hypertension; Hypothyroidism; Osteoporosis; Peripheral vascular disease [facility]) (May '12 - CT angio); Shortness of breath dyspnea; Varicose veins of leg with swelling; and Wears glasses.   reports that she quit smoking about 7 months ago. Her smoking use included Cigarettes. She has a 37.50 pack-year smoking history. She has never used smokeless tobacco.  Past Surgical History:  Procedure Laterality Date  . ABDOMINAL HYSTERECTOMY  1973   metorrhagia  . APPENDECTOMY    . BREAST SURGERY  2010   mastectomy with reconstructive surgery  . BREAST SURGERY  2010   rt lump x2  . BREAST SURGERY   2010   partial rt br reduction  . CARDIAC CATHETERIZATION  '11   no obstructive coronary disease  . COLECTOMY  2002   sigmoid  . COLONOSCOPY W/ POLYPECTOMY    . LUNG SURGERY  9/12   rt mid lung wedge  . PARTIAL MASTECTOMY WITH NEEDLE LOCALIZATION Left 02/23/2013   Procedure:  LEFT PARTIAL MASTECTOMY WITH NEEDLE LOCALIZATION;  Surgeon: Adin Hector, MD;  Location: [facility];  Service: General;  Laterality: Left;  . [place]   right upper lobe of lung as part of VAT-nodule/benign    Allergies  Allergen Reactions  . Sulfonamide Derivatives Swelling    Immunization History  Administered Date(s) Administered  . Influenza,inj,Quad PF,36+ Mos 08/10/2014, 08/12/2015, 07/03/2016  . Pneumococcal Conjugate-13 08/10/2014  . Pneumococcal Polysaccharide-23 08/12/2015  . Td 10/16/2007  . Zoster 11/09/2010    Family History  Problem Relation Age of Onset  . Colon cancer Mother     colon  . COPD Mother     brown lung  . Hypertension Mother   . Diabetes Mother   . Emphysema Mother   . Coronary artery disease Father   . Heart attack Father   . Sudden death Father   . Heart disease Father   . Cancer Sister     fallopian tube  . Hypertension Sister   . Coronary artery disease Brother   . Throat cancer Sister   . Melanoma Sister   . Hypertension Sister   . Pancreatic  cancer Sister   . Cancer Sister   . Heart attack Brother     early 66's  . Heart failure Sister      Current Outpatient Prescriptions:  .  albuterol (PROVENTIL HFA;VENTOLIN HFA) 108 (90 Base) MCG/ACT inhaler, Inhale 2 puffs into the lungs every 4 (four) hours as needed for wheezing. As a RESCUE medication, Disp: 1 Inhaler, Rfl: 1 .  amitriptyline (ELAVIL) 50 MG tablet, TAKE 1 TABLET (50 MG TOTAL) BY MOUTH AT BEDTIME., Disp: 90 tablet, Rfl: 3 .  amLODipine (NORVASC) 5 MG tablet, TAKE 1 TABLET (5 MG TOTAL) BY MOUTH DAILY., Disp: 90 tablet, Rfl: 1 .  aspirin 81 MG tablet, Take 81 mg by  mouth every evening. , Disp: , Rfl:  .  atorvastatin (LIPITOR) 80 MG tablet, TAKE 1 TABLET EVERY DAY. PLEASE KEEP UPCOMING APPT.THANK YOU, Disp: 90 tablet, Rfl: 3 .  B Complex Vitamins (VITAMIN-B COMPLEX PO), Take 1 tablet by mouth daily. , Disp: , Rfl:  .  bisacodyl (DULCOLAX) 5 MG EC tablet, Take 1 tablet (5 mg total) by mouth daily as needed for moderate constipation. (Patient taking differently: Take 5 mg by mouth daily. ), Disp: 30 tablet, Rfl: 11 .  buPROPion (WELLBUTRIN SR) 150 MG 12 hr tablet, Take 1 tablet (150 mg total) by mouth 2 (two) times daily., Disp: 180 tablet, Rfl: 1 .  Calcium Carbonate-Vitamin D (CALCIUM + D PO), Take 600 mg by mouth daily. , Disp: , Rfl:  .  cilostazol (PLETAL) 100 MG tablet, 1 TABLET BY MOUTH TWICE A DAY PLEASE CALL/ SCHEDULE 1 YEAR FOLLOW UP APPOINTMENT FOR FURTHER REFILLS, Disp: 180 tablet, Rfl: 0 .  fluticasone (FLONASE) 50 MCG/ACT nasal spray, USE 2 SPRAYS IN BOTH NOSTRILS DAILY., Disp: 48 g, Rfl: 3 .  Fluticasone-Salmeterol (ADVAIR DISKUS) 100-50 MCG/DOSE AEPB, Inhale 1 puff into the lungs 2 (two) times daily., Disp: 180 each, Rfl: 1 .  ipratropium-albuterol (DUONEB) 0.5-2.5 (3) MG/3ML SOLN, Take 3 mLs by nebulization every 6 (six) hours as needed. Use 3 times daily x 3 days, then every 6 hours as needed., Disp: 360 mL, Rfl: 2 .  levothyroxine (SYNTHROID, LEVOTHROID) 112 MCG tablet, TAKE ONE TABLET BY MOUTH ONE TIME DAILY, Disp: 90 tablet, Rfl: 3 .  LORazepam (ATIVAN) 2 MG tablet, TAKE 1 TABLET BY MOUTH AT BEDTIME AS NEEDED FOR ANXIETY, Disp: 30 tablet, Rfl: 2 .  metoprolol tartrate (LOPRESSOR) 25 MG tablet, TAKE 1 TABLET BY MOUTH 2 TIMES DAILY., Disp: 180 tablet, Rfl: 3 .  omeprazole (PRILOSEC) 40 MG capsule, TAKE 1 CAPSULE (40 MG TOTAL) BY MOUTH 2 (TWO) TIMES DAILY., Disp: 180 capsule, Rfl: 3   Review of Systems     Objective:   Physical Exam  Vitals:   11/16/16 1113  BP: 116/74  Pulse: 99  SpO2: 92%  Weight: 150 lb (68 kg)  Height: '5\' 5"'$   (1.651 m)    Estimated body mass index is 24.96 kg/m as calculated from the following:   Height as of this encounter: '5\' 5"'$  (1.651 m).   Weight as of this encounter: 150 lb (68 kg).      Assessment:     aecopd    Plan:     admit

## 2016-11-17 LAB — CBC WITH DIFFERENTIAL/PLATELET
Basophils Absolute: 0 10*3/uL (ref 0.0–0.1)
Basophils Relative: 0 %
Eosinophils Absolute: 0 10*3/uL (ref 0.0–0.7)
Eosinophils Relative: 0 %
HCT: 35.2 % — ABNORMAL LOW (ref 36.0–46.0)
Hemoglobin: 11.5 g/dL — ABNORMAL LOW (ref 12.0–15.0)
Lymphocytes Relative: 6 %
Lymphs Abs: 0.4 10*3/uL — ABNORMAL LOW (ref 0.7–4.0)
MCH: 30.2 pg (ref 26.0–34.0)
MCHC: 32.7 g/dL (ref 30.0–36.0)
MCV: 92.4 fL (ref 78.0–100.0)
Monocytes Absolute: 0.1 10*3/uL (ref 0.1–1.0)
Monocytes Relative: 1 %
Neutro Abs: 5.5 10*3/uL (ref 1.7–7.7)
Neutrophils Relative %: 93 %
Platelets: 391 10*3/uL (ref 150–400)
RBC: 3.81 MIL/uL — ABNORMAL LOW (ref 3.87–5.11)
RDW: 14.1 % (ref 11.5–15.5)
WBC: 5.9 10*3/uL (ref 4.0–10.5)

## 2016-11-17 LAB — COMPREHENSIVE METABOLIC PANEL
ALT: 26 U/L (ref 14–54)
AST: 37 U/L (ref 15–41)
Albumin: 3.9 g/dL (ref 3.5–5.0)
Alkaline Phosphatase: 68 U/L (ref 38–126)
Anion gap: 9 (ref 5–15)
BUN: 24 mg/dL — ABNORMAL HIGH (ref 6–20)
CO2: 24 mmol/L (ref 22–32)
Calcium: 8.9 mg/dL (ref 8.9–10.3)
Chloride: 104 mmol/L (ref 101–111)
Creatinine, Ser: 0.93 mg/dL (ref 0.44–1.00)
GFR calc Af Amer: >60 mL/min (ref >60)
GFR calc non Af Amer: 60 mL/min — ABNORMAL LOW (ref >60)
Glucose, Bld: 163 mg/dL — ABNORMAL HIGH (ref 65–99)
Potassium: 4.3 mmol/L (ref 3.5–5.1)
Sodium: 137 mmol/L (ref 135–145)
Total Bilirubin: 0.7 mg/dL (ref 0.3–1.2)
Total Protein: 6.7 g/dL (ref 6.5–8.1)

## 2016-11-17 LAB — PHOSPHORUS: Phosphorus: 3.2 mg/dL (ref 2.5–4.6)

## 2016-11-17 LAB — TROPONIN I: Troponin I: <0.03 ng/mL (ref <0.03)

## 2016-11-17 LAB — MAGNESIUM: Magnesium: 2 mg/dL (ref 1.7–2.4)

## 2016-11-17 MED ORDER — AZITHROMYCIN 250 MG PO TABS
250.0000 mg | ORAL_TABLET | Freq: Every day | ORAL | Status: AC
  Administered 2016-11-17 – 2016-11-20 (×4): 250 mg via ORAL
  Filled 2016-11-17 (×4): qty 1

## 2016-11-17 MED ORDER — IPRATROPIUM-ALBUTEROL 0.5-2.5 (3) MG/3ML IN SOLN
3.0000 mL | Freq: Four times a day (QID) | RESPIRATORY_TRACT | Status: DC
  Administered 2016-11-17 – 2016-11-21 (×16): 3 mL via RESPIRATORY_TRACT
  Filled 2016-11-17 (×16): qty 3

## 2016-11-17 NOTE — Progress Notes (Signed)
Name: [name] MRN: [id number] DOB: 1946-04-17    ADMISSION DATE:  11/16/2016  CHIEF COMPLAINT:  Acute on chronic Resp Failure  BRIEF PATIENT DESCRIPTION:  71 yo woman w COPD, experienced AE-COPD that was treated outpt w abx, steroids. Failed outpt therapy and presented to [facility] 2/2 with severe SOB, wheezing. Admitted for AE-COPD refractory to outpt meds and rx.   SIGNIFICANT EVENTS   STUDIES:  Flu A&B screen 2/2 >> negative RVP 2/2 >>    PAST MEDICAL HISTORY :   has a past medical history of Cancer ([place]) (2010); Complication of anesthesia; COPD (chronic obstructive pulmonary disease) ([place]); Diverticulitis; Emphysema of lung ([place]); Fibromyalgia (1995); GERD (gastroesophageal reflux disease); Hearing loss of both ears; colonic polyps; Hyperlipidemia; Hypertension; Hypothyroidism; Osteoporosis; Peripheral vascular disease [facility]) (May '12 - CT angio); Shortness of breath dyspnea; Varicose veins of leg with swelling; and Wears glasses.  has a past surgical history that includes Appendectomy; Wedge resection (1993); Colonoscopy w/ polypectomy; Abdominal hysterectomy (1973); Colectomy (2002); Cardiac catheterization ('11); Breast surgery (2010); Breast surgery (2010); Breast surgery (2010); Lung surgery (9/12); and Partial mastectomy with needle localization (Left, 02/23/2013). Prior to Admission medications   Medication Sig Start Date End Date Taking? Authorizing Provider  albuterol (PROVENTIL HFA;VENTOLIN HFA) 108 (90 Base) MCG/ACT inhaler Inhale 2 puffs into the lungs every 4 (four) hours as needed for wheezing. As a RESCUE medication 05/07/16  Yes Hosie Poisson, MD  amitriptyline (ELAVIL) 50 MG tablet TAKE 1 TABLET (50 MG TOTAL) BY MOUTH AT BEDTIME. 08/03/16  Yes Hoyt Koch, MD  amLODipine (NORVASC) 5 MG tablet TAKE 1 TABLET (5 MG TOTAL) BY MOUTH DAILY. 10/30/16  Yes Hoyt Koch, MD  aspirin 81 MG tablet Take 81 mg by mouth every evening.    Yes Historical Provider, MD   atorvastatin (LIPITOR) 80 MG tablet TAKE 1 TABLET EVERY DAY. PLEASE KEEP UPCOMING APPT.THANK YOU 09/25/16  Yes [name], NP  bisacodyl (DULCOLAX) 5 MG EC tablet Take 1 tablet (5 mg total) by mouth daily as needed for moderate constipation. Patient taking differently: Take 5 mg by mouth daily.  08/10/14  Yes Hoyt Koch, MD  buPROPion [facility] SR) 150 MG 12 hr tablet Take 1 tablet (150 mg total) by mouth 2 (two) times daily. 08/06/16  Yes Hoyt Koch, MD  cilostazol (PLETAL) 100 MG tablet 1 TABLET BY MOUTH TWICE A DAY PLEASE CALL/ SCHEDULE 1 YEAR FOLLOW UP APPOINTMENT FOR FURTHER REFILLS 10/12/16  Yes Larey Dresser, MD  fluticasone (FLONASE) 50 MCG/ACT nasal spray USE 2 SPRAYS IN BOTH NOSTRILS DAILY. 09/17/16  Yes Hoyt Koch, MD  Fluticasone-Salmeterol (ADVAIR DISKUS) 100-50 MCG/DOSE AEPB Inhale 1 puff into the lungs 2 (two) times daily. 05/07/16  Yes Hosie Poisson, MD  guaiFENesin (MUCINEX) 600 MG 12 hr tablet Take 600 mg by mouth 2 (two) times daily.   Yes Historical Provider, MD  ipratropium-albuterol (DUONEB) 0.5-2.5 (3) MG/3ML SOLN Take 3 mLs by nebulization every 6 (six) hours as needed. Use 3 times daily x 3 days, then every 6 hours as needed. 07/10/16  Yes Eugenie Filler, MD  levothyroxine (SYNTHROID, LEVOTHROID) 112 MCG tablet TAKE ONE TABLET BY MOUTH ONE TIME DAILY 08/16/15  Yes Hoyt Koch, MD  LORazepam (ATIVAN) 2 MG tablet TAKE 1 TABLET BY MOUTH AT BEDTIME AS NEEDED FOR ANXIETY 09/17/16  Yes Hoyt Koch, MD  metoprolol tartrate (LOPRESSOR) 25 MG tablet TAKE 1 TABLET BY MOUTH 2 TIMES DAILY. 08/22/16  Yes  Hoyt Koch, MD  omeprazole (PRILOSEC) 40 MG capsule TAKE 1 CAPSULE (40 MG TOTAL) BY MOUTH 2 (TWO) TIMES DAILY. 08/06/16  Yes Hoyt Koch, MD   Allergies  Allergen Reactions  . Sulfonamide Derivatives Swelling    Swelling in face    FAMILY HISTORY:  family history includes COPD in her mother; Cancer in her  sister and sister; Colon cancer in her mother; Coronary artery disease in her brother and father; Diabetes in her mother; Emphysema in her mother; Heart attack in her brother and father; Heart disease in her father; Heart failure in her sister; Hypertension in her mother, sister, and sister; Melanoma in her sister; Pancreatic cancer in her sister; Sudden death in her father; Throat cancer in her sister. SOCIAL HISTORY:  reports that she quit smoking about 8 months ago. Her smoking use included Cigarettes. She has a 37.50 pack-year smoking history. She has never used smokeless tobacco. She reports that she drinks about 6.0 oz of alcohol per week . She reports that she does not use drugs.   SUBJECTIVE:  Feels a bit better, less hoarse, feels like she needs to cough up mucous but unable  VITAL SIGNS: Temp:  [97.7 F (36.5 C)-98.1 F (36.7 C)] 97.7 F (36.5 C) (02/03 0509) Pulse Rate:  [86-103] 103 (02/03 0940) Resp:  [20] 20 (02/03 0940) BP: (90-118)/(58-74) 118/69 (02/03 0940) SpO2:  [92 %-99 %] 95 % (02/03 0940) Weight:  [68 kg (150 lb)] 68 kg (150 lb) (02/02 1348)  PHYSICAL EXAMINATION: General: well-developed woman, NAd on O2 Neuro:  Awake, alert, appropriate HEENT:  Significant UA noise, insp and exp stridor Cardiovascular:  RRR, no M Lungs:  Referred UA noise and also some diffuse B wheezes Abdomen:  Soft and benign Musculoskeletal:  No deformities Skin:  ? Very faint rash on ventral surfaces UE's   Recent Labs Lab 11/16/16 1320 11/17/16 0557  NA  --  137  K  --  4.3  CL  --  104  CO2  --  24  BUN  --  24*  CREATININE 0.89 0.93  GLUCOSE  --  163*    Recent Labs Lab 11/16/16 1320 11/17/16 0557  HGB 13.0 11.5*  HCT 40.9 35.2*  WBC 10.5 5.9  PLT 410* 391   Dg Chest Port 1 View  Result Date: 11/16/2016 CLINICAL DATA:  COPD exacerbation.  Worsening cough. EXAM: PORTABLE CHEST 1 VIEW COMPARISON:  07/07/2016 chest radiograph. FINDINGS: Surgical sutures overlie the  right upper lung. Surgical clips overlie the right chest and lateral right breast. Stable cardiomediastinal silhouette with normal heart size and aortic atherosclerosis. No pneumothorax. No significant pleural effusion (small portions of the costophrenic angles are excluded from the image). Emphysema. No pulmonary edema. No acute consolidative airspace disease. IMPRESSION: 1. Emphysema.  No acute cardiopulmonary disease. 2. Aortic atherosclerosis. Electronically Signed   By: Ilona Sorrel M.D.   On: 11/16/2016 14:28    ASSESSMENT / PLAN:  Severe COPD with acute exacerbation refractory to outpt therapy Flu screen negative Pro-calcitonin negative, CXR 2/2 negative for PNA Chronic allergic rhinitis B LL Pulmonary nodules 9m  - IV steroids - scheduled and prn BD's - de-escalate abx to azithro alone 2/3 in absence evidence PNA - stop tamiflu - IgE pending - fluticasone nasal spray - mucinex - continue droplet isolation pending RVP results.  - needs repeat Ct chest in 12 months to follow 422mnodules.   HTN - amlodipine - pletal - metoprolol  Hyperlipidemia - atorvastatin  Hypothyroidism - synthroid  GERD  - protonix  Chronic anxiety - ativan prn - wellbutrin - amitriptyline   DVT prophylaxis - heparin sq   Time spent: 36 minutes  Baltazar Apo, MD, PhD 11/17/2016, 11:27 AM [facility] [phone] or if no answer [phone]

## 2016-11-18 LAB — RESPIRATORY PANEL BY PCR

## 2016-11-18 LAB — PROCALCITONIN: Procalcitonin: <0.1 ng/mL

## 2016-11-18 MED ORDER — PREDNISONE 20 MG PO TABS
40.0000 mg | ORAL_TABLET | Freq: Every day | ORAL | Status: DC
  Administered 2016-11-19 – 2016-11-20 (×2): 40 mg via ORAL
  Filled 2016-11-18 (×2): qty 2

## 2016-11-18 NOTE — Progress Notes (Signed)
Name: [name] MRN: [id number] DOB: 1946/03/18    ADMISSION DATE:  11/16/2016  CHIEF COMPLAINT:  Acute on chronic Resp Failure  BRIEF PATIENT DESCRIPTION:  71 yo woman w COPD, experienced AE-COPD that was treated outpt w abx, steroids. Failed outpt therapy and presented to [facility] 2/2 with severe SOB, wheezing. Admitted for AE-COPD refractory to outpt meds and rx.   SIGNIFICANT EVENTS   STUDIES:  Flu A&B screen 2/2 >> negative RVP 2/2 >>  IgE 2/2 >>   SUBJECTIVE:  A little less hoarseness Coughing, feels that her chest is tight, has mucous she cannot clear  VITAL SIGNS: Temp:  [97.8 F (36.6 C)-98.2 F (36.8 C)] 98.2 F (36.8 C) (02/04 0700) Pulse Rate:  [71-96] 71 (02/04 0700) Resp:  [18] 18 (02/04 0700) BP: (105-130)/(70-87) 105/87 (02/04 0700) SpO2:  [91 %-98 %] 94 % (02/04 0713)  PHYSICAL EXAMINATION: Gen: Pleasant, ill appearing, in no distress,  normal affect  ENT: No lesions,  mouth clear,  oropharynx clear, no postnasal drip, hoarse voice  Neck: No JVD, insp and exp; stridor  Lungs: No use of accessory muscles, referred UA noise, bilateral exp wheeze lower pitched, probably moving more air today 2/4  Cardiovascular: RRR, heart sounds normal, no murmur or gallops, no peripheral edema  Musculoskeletal: No deformities, no cyanosis or clubbing  Neuro: alert, non focal  Skin: faint rash on ventral surface UE    Recent Labs Lab 11/16/16 1320 11/17/16 0557  HGB 13.0 11.5*  HCT 40.9 35.2*  WBC 10.5 5.9  PLT 410* 391    Recent Labs Lab 11/16/16 1320 11/17/16 0557  NA  --  137  K  --  4.3  CL  --  104  CO2  --  24  GLUCOSE  --  163*  BUN  --  24*  CREATININE 0.89 0.93  CALCIUM  --  8.9  MG 1.9 2.0  PHOS  --  3.2    Dg Chest Port 1 View  Result Date: 11/16/2016 CLINICAL DATA:  COPD exacerbation.  Worsening cough. EXAM: PORTABLE CHEST 1 VIEW COMPARISON:  07/07/2016 chest radiograph. FINDINGS: Surgical sutures overlie the right upper  lung. Surgical clips overlie the right chest and lateral right breast. Stable cardiomediastinal silhouette with normal heart size and aortic atherosclerosis. No pneumothorax. No significant pleural effusion (small portions of the costophrenic angles are excluded from the image). Emphysema. No pulmonary edema. No acute consolidative airspace disease. IMPRESSION: 1. Emphysema.  No acute cardiopulmonary disease. 2. Aortic atherosclerosis. Electronically Signed   By: Ilona Sorrel M.D.   On: 11/16/2016 14:28    ASSESSMENT / PLAN:  Severe COPD with acute exacerbation refractory to outpt therapy Flu screen negative Pro-calcitonin negative, CXR 2/2 negative for PNA Chronic allergic rhinitis B LL Pulmonary nodules 78m  - convert steroids to Po 2/4 - scheduled and prn BD's - de-escalated abx to azithro alone 2/3 in absence evidence PNA, continue - IgE pending, follow - continue fluticasone nasal spray - continue mucinex - continue droplet isolation pending RVP results.  - needs repeat Ct chest in 12 months to follow 423mnodules.   HTN - amlodipine - pletal -metoprolol  Hyperlipidemia - continue atorvastatin  Hypothyroidism - continue synthroid  GERD  - continue protonix  Chronic anxiety - on ativan prn - continue wellbutrin - continue amitriptyline   DVT prophylaxis - continue heparin sq   Time spent: 36 minutes  RoBaltazar ApoMD, PhD 11/18/2016, 11:39 AM [facility] [phone]  or if no answer [phone]

## 2016-11-19 DIAGNOSIS — R21 Rash and other nonspecific skin eruption: Secondary | ICD-10-CM

## 2016-11-19 NOTE — Progress Notes (Signed)
   Name: [name] MRN: [id number] DOB: May 13, 1946    ADMISSION DATE:  11/16/2016  CHIEF COMPLAINT:  Acute on chronic Resp Failure  BRIEF PATIENT DESCRIPTION:  71 yo woman w COPD, experienced AE-COPD that was treated outpt w abx, steroids. Failed outpt therapy and presented to [facility] 2/2 with severe SOB, wheezing. Admitted for AE-COPD refractory to outpt meds and rx.   SIGNIFICANT EVENTS   STUDIES:  Flu A&B screen 2/2 >> negative RVP 2/2 >> neg IgE 2/2 >>   SUBJECTIVE:   Remains hoarse Developed respiratory distress overnight Cough is dry Afebrile  VITAL SIGNS: Temp:  [97.9 F (36.6 C)-98.1 F (36.7 C)] 97.9 F (36.6 C) (02/05 1353) Pulse Rate:  [80-84] 82 (02/05 1353) Resp:  [18] 18 (02/05 1353) BP: (108-144)/(66-82) 108/66 (02/05 1353) SpO2:  [92 %-96 %] 92 % (02/05 1353)  PHYSICAL EXAMINATION: Gen: Pleasant, ill appearing, in no distress,  Anxious affect,On 3 L oxygen nasal cannula  ENT: No lesions,  mouth clear,  oropharynx clear, no postnasal drip, hoarse voice  Neck: No JVD, insp and exp; stridor  Lungs: No use of accessory muscles, referred UA noise, bilateral exp Rhonchi  Cardiovascular: RRR, heart sounds normal, no murmur or gallops, no peripheral edema  Musculoskeletal: No deformities, no cyanosis or clubbing  Neuro: alert, non focal  Skin: faint rash on ventral surface UE    Recent Labs Lab 11/16/16 1320 11/17/16 0557  HGB 13.0 11.5*  HCT 40.9 35.2*  WBC 10.5 5.9  PLT 410* 391    Recent Labs Lab 11/16/16 1320 11/17/16 0557  NA  --  137  K  --  4.3  CL  --  104  CO2  --  24  GLUCOSE  --  163*  BUN  --  24*  CREATININE 0.89 0.93  CALCIUM  --  8.9  MG 1.9 2.0  PHOS  --  3.2    No results found.  ASSESSMENT / PLAN:  Severe COPD with acute exacerbation refractory to outpt therapy -No evidence of pneumonia or viral infection Chronic allergic rhinitis  - Continue 40 mg prednisone - scheduled and prn BD's - de-escalated  abx to azithro alone 2/3 in absence evidence PNA, continue - IgE pending, She requests allergy testing as outpatient - continue fluticasone nasal spray - continue mucinex  B LL Pulmonary nodules 26m  - needs repeat Ct chest in 12 months to follow 426mnodules.   Rash of unclear etiology- monitor with serial exams, may need dermatology consult as outpatient  HTN - amlodipine - pletal -metoprolol  Hyperlipidemia - continue atorvastatin  Hypothyroidism - continue synthroid  GERD  - continue protonix  Chronic anxiety - on ativan prn - continue wellbutrin - continue amitriptyline   DVT prophylaxis - continue heparin sq   RaKara MeadD. FCCP. [facility] Pager [phone] no response call [phone]    11/19/2016, 2:42 PM

## 2016-11-20 ENCOUNTER — Ambulatory Visit: Payer: Medicare Other | Admitting: Internal Medicine

## 2016-11-20 DIAGNOSIS — B370 Candidal stomatitis: Secondary | ICD-10-CM

## 2016-11-20 LAB — EXPECTORATED SPUTUM ASSESSMENT W GRAM STAIN, RFLX TO RESP C

## 2016-11-20 LAB — PROCALCITONIN: Procalcitonin: <0.1 ng/mL

## 2016-11-20 MED ORDER — METHYLPREDNISOLONE SODIUM SUCC 40 MG IJ SOLR
40.0000 mg | Freq: Two times a day (BID) | INTRAMUSCULAR | Status: DC
  Administered 2016-11-20 – 2016-11-21 (×3): 40 mg via INTRAVENOUS
  Filled 2016-11-20 (×3): qty 1

## 2016-11-20 MED ORDER — HYDROXYZINE HCL 10 MG/5ML PO SYRP
10.0000 mg | ORAL_SOLUTION | Freq: Three times a day (TID) | ORAL | Status: DC
  Administered 2016-11-20 – 2016-11-21 (×4): 10 mg via ORAL
  Filled 2016-11-20 (×5): qty 5

## 2016-11-20 MED ORDER — PANTOPRAZOLE SODIUM 40 MG PO TBEC
40.0000 mg | DELAYED_RELEASE_TABLET | Freq: Two times a day (BID) | ORAL | Status: DC
  Administered 2016-11-20 – 2016-11-21 (×3): 40 mg via ORAL
  Filled 2016-11-20 (×2): qty 1

## 2016-11-20 MED ORDER — NYSTATIN 100000 UNIT/ML MT SUSP
5.0000 mL | Freq: Four times a day (QID) | OROMUCOSAL | Status: DC
  Administered 2016-11-20 – 2016-11-21 (×5): 500000 [IU] via ORAL
  Filled 2016-11-20 (×5): qty 5

## 2016-11-20 NOTE — Progress Notes (Signed)
   Name: [name] MRN: [id number] DOB: [birth year]    ADMISSION DATE:  11/16/2016  CHIEF COMPLAINT:  Acute on chronic Resp Failure  BRIEF PATIENT DESCRIPTION:  71 yo woman w COPD, experienced AE-COPD that was treated outpt w abx, steroids. Failed outpt therapy and presented to [facility] 2/2 with severe SOB, wheezing. Admitted for AE-COPD refractory to outpt meds and rx.   SIGNIFICANT EVENTS   STUDIES:  Flu A&B screen 2/2 >> negative RVP 2/2 >> neg IgE 2/2 >>   SUBJECTIVE:    VITAL SIGNS: Temp:  [97.5 F (36.4 C)-97.9 F (36.6 C)] 97.5 F (36.4 C) (02/06 0502) Pulse Rate:  [77-84] 77 (02/06 0502) Resp:  [16-20] 20 (02/06 0502) BP: (108-162)/(66-86) 138/86 (02/06 0502) SpO2:  [92 %-99 %] 99 % (02/06 0502)  PHYSICAL EXAMINATION: General appearance:  71 Year old female, well nourished NAD, currently  conversant  Eyes: anicteric sclerae, moist conjunctivae; PERRL, EOMI bilaterally. Mouth:  membranes moist, multiple pin top sized cream colored mucosal lesions  Neck: Trachea midline; neck supple, no JVD, turbulent and loud upper airway wheezing Lungs/chest: diffuse wheeze, difficult to tell what is upper and what is lower airway , with normal respiratory effort and no intercostal retractions CV: RRR, no MRGs  Abdomen: Soft, non-tender; no masses or HSM Extremities: No peripheral edema or extremity lymphadenopathy Skin: Normal temperature, turgor and texture; no rash, ulcers or subcutaneous nodules Psych: Appropriate affect, alert and oriented to person, place and time     Recent Labs Lab 11/16/16 1320 11/17/16 0557  HGB 13.0 11.5*  HCT 40.9 35.2*  WBC 10.5 5.9  PLT 410* 391    Recent Labs Lab 11/16/16 1320 11/17/16 0557  NA  --  137  K  --  4.3  CL  --  104  CO2  --  24  GLUCOSE  --  163*  BUN  --  24*  CREATININE 0.89 0.93  CALCIUM  --  8.9  MG 1.9 2.0  PHOS  --  3.2    No results found.  ASSESSMENT / PLAN:  AECOPD refractory to out-pt rx.    Severe COPD Chronic allergic Rhinitis  LPR ->better but still has significant upper airway burden Plan Complete 5d total azith Cont mucinex, flonase & pantoprazole->change PPI to BID Cont scheduled & PRN BDs Add first generation antihistamine  Out pt allergy testing  Oral thrush -->may be complicating her upper airway noises  Plan Nystatin qid  BLL pulmonary nodules Plan Repeat CT chest 12 mo  Rash of unclear etiology Plan Can see derm as out-pt (will leave this up to her PCP)  HTN Plan Cont amilodipine, pletal and lopressor  HL plan Cont statin   Hypothyroidism Plan Cont synthroid  GERD Plan PPI  Chronic anxiety Plan Cont wellbutrin and amitriptyline   Making some improvement. Marked upper airway noises. Has thrush. This could be contributing. Will treat w/ nystatin swish and swallow. Also says her reflux a little worse. Actually had been wondering if she had skipped dosing. More likely exacerbated by her cough and the steroids. Will increase her PPI to bid. Ultimately I think she may be able to go home tomorrow.    Erick Colace ACNP-BC [place] Pager # [phone] OR # [phone] if no answer     11/20/2016, 9:15 AM

## 2016-11-20 NOTE — Discharge Summary (Signed)
Physician Discharge Summary       Patient ID: [name] MRN: [id number] DOB/AGE: April 09, 1946 71 y.o.  Admit date: 11/16/2016 Discharge date: 11/21/2016  Discharge Diagnoses:   AECOPD refractory to out patient care Severe COPD Chronic allergic Rhinitis  Laryngopharyngeal Reflux Disease Oral Thrush  Bilateral Pulmonary Nodules  Hypertension  Detailed Hospital Course:  This is a 71 year old female w/ stage II-III COPD (alpha 1 MM) w/ BD response at baseline. Recently seen in effort to sign up for a COPD study but at the time found to be in exacerbation so was sent home with antibiotics and prednisone. She returned 2/2 w/ cc: several days of declining cough, wheezing, shortness of breath and sputum which was green in color. In the office she exhibited class IV dyspnea so she was admitted for further evaluation and therapy.  In the hospital she was treated in the usual fashion which included: supplemental oxygen, scheduled BDs, initiation of tamiflu, systemic steroids,  and empiric antibiotics. Respiratory viral panel was sent, this was negative and therefore Tamiflu was stopped. Antibiotics were narrowed to azithromycin for which a 5d course was completed. Her course was complicated by significant upper airway burden and marked upper airway wheeze. This was multifactorial and likely the result of: GERD, nasal gtt and oral thrush. These were aggressively treated and steroids were continued because of this at higher dosing. As of 2/5 her symptom burden was starting to improve we did increase pulse her steroids X 1 more time. As of 2/7 she is now ready for dc with the plan as outlined below.     Discharge Plan by active problems   AECOPD, complicated by LPR and Oral [place] on home Oxygen Continue Protonix daily Cont flonase and Mucinex F/u re: allergy testing Slow pred taper over 10-14d  Resume Advair and duoneb Cont Nystatin X 12 more days  ROV on   Bilateral Pulmonary  Nodules Plan F/u out-pt  Intermittent rash Plan F/u derm (PCP can refer)   HTN Plan Continue amilodipine, pletal and lopressor   HypoThryodism  Plan Cont Synthroid   GERD Plan  PPI   Chronic Anxiety  Plan  Cont home rx    Significant Hospital tests/ studies  Consults   Discharge Exam: BP 139/84   Pulse 88   Temp 98.5 F (36.9 C) (Oral)   Resp 18   Ht '5\' 5"'$  (1.651 m)   Wt 150 lb (68 kg)   SpO2 95%   BMI 24.96 kg/m  3 liters  General appearance:  71 Year old  female, well nourished  NAD,  conversant  Eyes: anicteric sclerae, moist conjunctivae; PERRL, EOMI bilaterally. Mouth:  membranes moist, still has multiple pin top sized cream colored creamy mucosal lesions; normal hard and soft palate, air movement less turbulent in upper airways Neck: Trachea midline; neck supple, no JVD Lungs/chest: wheeze t/o but good air movement, with normal respiratory effort and no intercostal retractions CV: RRR, no MRGs  Abdomen: Soft, non-tender; no masses or HSM Extremities: No peripheral edema or extremity lymphadenopathy Skin: Normal temperature, turgor and texture; no rash, ulcers or subcutaneous nodules Psych: Appropriate affect, alert and oriented to person, place and time   Labs at discharge Lab Results  Component Value Date   CREATININE 0.93 11/17/2016   BUN 24 (H) 11/17/2016   NA 137 11/17/2016   K 4.3 11/17/2016   CL 104 11/17/2016   CO2 24 11/17/2016   Lab Results  Component Value Date  WBC 5.9 11/17/2016   HGB 11.5 (L) 11/17/2016   HCT 35.2 (L) 11/17/2016   MCV 92.4 11/17/2016   PLT 391 11/17/2016   Lab Results  Component Value Date   ALT 26 11/17/2016   AST 37 11/17/2016   ALKPHOS 68 11/17/2016   BILITOT 0.7 11/17/2016   Lab Results  Component Value Date   INR 0.98 03/23/2013   INR 0.94 06/12/2011   INR 0.93 03/07/2011    Current radiology studies No results found.  Disposition:  01-Home or Self Care  Discharge Instructions    Diet  - low sodium heart healthy    Complete by:  As directed    Increase activity slowly    Complete by:  As directed      Allergies as of 11/21/2016      Reactions   Sulfonamide Derivatives Swelling   Swelling in face      Medication List    TAKE these medications   albuterol 108 (90 Base) MCG/ACT inhaler Commonly known as:  PROVENTIL HFA;VENTOLIN HFA Inhale 2 puffs into the lungs every 4 (four) hours as needed for wheezing. As a RESCUE medication   amitriptyline 50 MG tablet Commonly known as:  ELAVIL TAKE 1 TABLET (50 MG TOTAL) BY MOUTH AT BEDTIME.   amLODipine 5 MG tablet Commonly known as:  NORVASC Take 1 tablet (5 mg total) by mouth daily.   aspirin 81 MG tablet Take 81 mg by mouth every evening.   atorvastatin 80 MG tablet Commonly known as:  LIPITOR TAKE 1 TABLET EVERY DAY. PLEASE KEEP UPCOMING APPT.THANK YOU   bisacodyl 5 MG EC tablet Commonly known as:  DULCOLAX Take 1 tablet (5 mg total) by mouth daily as needed for moderate constipation. What changed:  when to take this   buPROPion 150 MG 12 hr tablet Commonly known as:  WELLBUTRIN SR Take 1 tablet (150 mg total) by mouth 2 (two) times daily.   cilostazol 100 MG tablet Commonly known as:  PLETAL 1 TABLET BY MOUTH TWICE A DAY PLEASE CALL/ SCHEDULE 1 YEAR FOLLOW UP APPOINTMENT FOR FURTHER REFILLS   fluticasone 50 MCG/ACT nasal spray Commonly known as:  FLONASE USE 2 SPRAYS IN BOTH NOSTRILS DAILY.   Fluticasone-Salmeterol 100-50 MCG/DOSE Aepb Commonly known as:  ADVAIR DISKUS Inhale 1 puff into the lungs 2 (two) times daily.   hydrOXYzine 10 MG/5ML syrup Commonly known as:  ATARAX Take 5 mLs (10 mg total) by mouth 3 (three) times daily.   ipratropium-albuterol 0.5-2.5 (3) MG/3ML Soln Commonly known as:  DUONEB Take 3 mLs by nebulization every 6 (six) hours as needed. Use 3 times daily x 3 days, then every 6 hours as needed.   levothyroxine 112 MCG tablet Commonly known as:  SYNTHROID, LEVOTHROID TAKE  ONE TABLET BY MOUTH ONE TIME DAILY   LORazepam 2 MG tablet Commonly known as:  ATIVAN TAKE 1 TABLET BY MOUTH AT BEDTIME AS NEEDED FOR ANXIETY   metoprolol tartrate 25 MG tablet Commonly known as:  LOPRESSOR TAKE 1 TABLET BY MOUTH 2 TIMES DAILY.   MUCINEX 600 MG 12 hr tablet Generic drug:  guaiFENesin Take 600 mg by mouth 2 (two) times daily.   nystatin 100000 UNIT/ML suspension Commonly known as:  MYCOSTATIN Take 5 mLs (500,000 Units total) by mouth 4 (four) times daily.   omeprazole 40 MG capsule Commonly known as:  PRILOSEC TAKE 1 CAPSULE (40 MG TOTAL) BY MOUTH 2 (TWO) TIMES DAILY.   predniSONE 10 MG tablet Commonly known as:  DELTASONE Take 4 tabs  daily with food x 4 days, then 3 tabs daily x 4 days, then 2 tabs daily x 4 days, then 1 tab daily x4 days then stop. #40        Discharged Condition: good  Physician Statement:   The Patient was personally examined, the discharge assessment and plan has been personally reviewed and I agree with ACNP Babcock's assessment and plan. 33 minutes of time have been dedicated to discharge assessment, planning and discharge instructions.   Signed: Clementeen Graham 11/21/2016, 9:33 AM  Chesley Mires, MD [place] [facility] 11/21/2016, 11:06 AM Pager:  [phone] After 3pm call: [phone]

## 2016-11-20 NOTE — Care Management Important Message (Signed)
Important Message  Patient Details  Name: [name] MRN: [id number] Date of Birth: 05/20/1946   Medicare Important Message Given:  Yes    Kerin Salen 11/20/2016, 11:16 AMImportant Message  Patient Details  Name: [name] MRN: [id number] Date of Birth: June 10, 1946   Medicare Important Message Given:  Yes    Kerin Salen 11/20/2016, 11:16 AM

## 2016-11-21 ENCOUNTER — Ambulatory Visit: Payer: Medicare Other | Admitting: Internal Medicine

## 2016-11-21 LAB — CULTURE, BLOOD (ROUTINE X 2)
Culture: NO GROWTH
Culture: NO GROWTH

## 2016-11-21 MED ORDER — NYSTATIN 100000 UNIT/ML MT SUSP: 5.0000 mL | Freq: Four times a day (QID) | OROMUCOSAL | 0 refills | Status: DC

## 2016-11-21 MED ORDER — HYDROXYZINE HCL 10 MG/5ML PO SYRP: 10.0000 mg | ORAL_SOLUTION | Freq: Three times a day (TID) | ORAL | 0 refills | Status: DC

## 2016-11-21 MED ORDER — AMLODIPINE BESYLATE 5 MG PO TABS: 5.0000 mg | ORAL_TABLET | Freq: Every day | ORAL | 1 refills | Status: DC

## 2016-11-21 MED ORDER — PREDNISONE 10 MG PO TABS: ORAL_TABLET | ORAL | 0 refills | Status: DC

## 2016-11-22 ENCOUNTER — Telehealth: Payer: Self-pay | Admitting: Internal Medicine

## 2016-11-22 LAB — CULTURE, RESPIRATORY

## 2016-11-22 LAB — CULTURE, RESPIRATORY W GRAM STAIN: Culture: NORMAL

## 2016-11-22 LAB — IGE: IgE (Immunoglobulin E), Serum: 180 IU/mL — ABNORMAL HIGH (ref 0–100)

## 2016-11-22 NOTE — Telephone Encounter (Signed)
ono 11/07/16 - pulse ox < 88% fro 20 min - rec 2L [place] at night. She might already be on it  Dr. Brand Males, M.D., [facility].C.P Pulmonary and Critical Care Medicine Staff Physician [place] [facility] Pager: [phone], If no answer or between  15:00h - 7:00h: call [phone]  11/22/2016 6:39 PM

## 2016-11-23 NOTE — Telephone Encounter (Signed)
Called and spoke to pt. Informed her of the results per MR. Pt already has O2. Pt verbalized understanding and denied any further questions or concerns at this time.

## 2016-11-28 ENCOUNTER — Encounter: Payer: Self-pay | Admitting: Acute Care

## 2016-11-28 ENCOUNTER — Ambulatory Visit (INDEPENDENT_AMBULATORY_CARE_PROVIDER_SITE_OTHER): Payer: Medicare Other | Admitting: Acute Care

## 2016-11-28 VITALS — BP 122/82 | HR 92 | Ht 65.0 in | Wt 153.4 lb

## 2016-11-28 DIAGNOSIS — R21 Rash and other nonspecific skin eruption: Secondary | ICD-10-CM | POA: Insufficient documentation

## 2016-11-28 DIAGNOSIS — J309 Allergic rhinitis, unspecified: Secondary | ICD-10-CM | POA: Insufficient documentation

## 2016-11-28 DIAGNOSIS — R911 Solitary pulmonary nodule: Secondary | ICD-10-CM

## 2016-11-28 DIAGNOSIS — J441 Chronic obstructive pulmonary disease with (acute) exacerbation: Secondary | ICD-10-CM | POA: Diagnosis not present

## 2016-11-28 DIAGNOSIS — B370 Candidal stomatitis: Secondary | ICD-10-CM | POA: Diagnosis not present

## 2016-11-28 DIAGNOSIS — K219 Gastro-esophageal reflux disease without esophagitis: Secondary | ICD-10-CM

## 2016-11-28 NOTE — Assessment & Plan Note (Signed)
Oral thrush upon admission to 12/04/2016 Resolving with use of nystatin mouthwash Plan Continue nystatin mouthwash through 12/03/2016 as instructed on discharge paperwork

## 2016-11-28 NOTE — Patient Instructions (Addendum)
It is nice to meet you today. We will walk you in the office today to see if you qualify for oxygen Saturation goal is 88-92% Continue wearing your oxygen at night at 2 L [place] Continue prednisone taper as prescribed I the hospital until gone. Call [facility] and reschedule your research appointment. Continue your Advair 2 puff twice daily as you have been doing. Continue Duonebs every 6 hours as needed. Rinse mouth after use. Continue Nystatin mouth wash until 2/19 as instructed in discharge paperwork. Referral to Dermatology for rash. Continue Mucinex as needed for chest congestion. Follow up appointment with Dr. Chase Caller as is scheduled for 12/31/2016. Please contact office for sooner follow up if symptoms do not improve or worsen or seek emergency care  .

## 2016-11-28 NOTE — Progress Notes (Signed)
History of Present Illness [name] is a 71 y.o. 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 She is  followed by Dr. Chase Caller.  Synopsis 71 year old female with moderate COPD per PFT's. She had left upper lobe pneumonia admission in July 2017. Followed by CVG exacerbation in September 2017 that was significantly severe requiring BiPAP therapy before improvement was noted.  She is maintained on  Advair 100 twice daily and DuoNeb 3 times daily. She required hospitalization again on 11/16/2016 for acute on chronic respiratory failure   She is currently participating in the-Danirixin trial, through [facility]. Research group  PFT's  done on  August 16, 2016 showed moderate COPD with an FEV1 at 56%, ratio 52, FVC 83%, no significant bronchodilator response, DLCO 52%.  11/28/2016 Hospital Follow Up: Patient presents to the office today for hospital follow-up. She was hospitalized from 11/16/2016 through 11/21/2016 acute on chronic respiratory failure, acute exacerbation of COPD. She was seen in the office prior to admission and failed outpatient therapy of antibiotics and prednisone. She was admitted 2/ 2 with class IV dyspnea. She was treated with supplemental oxygen, scheduled bronchodilators, initiation of Tamiflu, systemic steroids, and empiric antibiotics. Respiratory viral panel was negative and therefore Tamiflu was discontinued. Antibiotics were narrowed to azithromycin times a 5 day course. Her course was complicated by significant upper airway burden and marked upper airway wheeze. This is multifactorial likely result of GERD, postnasal drip, and oral thrush. These were aggressively treated and steroids were continued at a higher dose due to this slow to resolve problem. Her symptom burden was significantly reduced on 2/5 and the patient was discharged to 05/03/2017 on a slow prednisone  taper lasting 10-14 days. She presents today stating she has been compliant with her prednisone taper, which she is currently completing. He states she feels much better, and has less shortness of breath.. She states she has been using her home oxygen at 3 L nasal cannula during the day. It is currently  prescribed only for nighttime use at 2 L per O&O done January 2018. Oxygen saturations in the office today upon ambulation were 93-94% on room air. She states she has a nonproductive cough that is negative for any secretions. She states the oral thrush that was noted on admission has cleared with treatment with nystatin mouthwash. She states she is compliant with her Advair, DuoNeb, Flonase, Mucinex, and Protonix. She denies chest pain, fever, orthopnea, or hemoptysis. She is hard of hearing, and her husband is with her today.   Tests Flu A&B screen 2/2 >> negative RVP 2/2 >> neg IgE 2/2 >> 180IU/mL   Past medical hx Past Medical History:  Diagnosis Date  . Cancer ([place]) 2010   invasive ductal breast cancer; on tamoxifen  . Complication of anesthesia    was in ICU after lung surgery 2012  . COPD (chronic obstructive pulmonary disease) ([place])   . Diverticulitis    required colcectomy  . Emphysema of lung ([place])   . Fibromyalgia 1995  . GERD (gastroesophageal reflux disease)   . Hearing loss of both ears    uses hearing aids  bilaterally  . Hx of colonic polyps    Dr. Cristina Gong -last study '11  . Hyperlipidemia   . Hypertension   . Hypothyroidism   . Osteoporosis   . Peripheral vascular disease [facility]) May '12 - CT angio  SMA chronic occlusion; stenosis of celiac trunk.  . Shortness of breath dyspnea   . Varicose veins of leg with swelling    varicose vein surgery - Dr. Aleda Grana  . Wears glasses      Past surgical hx, Family hx, Social hx all reviewed.  Current Outpatient Prescriptions on File Prior to Visit  Medication Sig  . albuterol (PROVENTIL HFA;VENTOLIN HFA) 108 (90  Base) MCG/ACT inhaler Inhale 2 puffs into the lungs every 4 (four) hours as needed for wheezing. As a RESCUE medication  . amitriptyline (ELAVIL) 50 MG tablet TAKE 1 TABLET (50 MG TOTAL) BY MOUTH AT BEDTIME.  Marland Kitchen amLODipine (NORVASC) 5 MG tablet Take 1 tablet (5 mg total) by mouth daily.  Marland Kitchen aspirin 81 MG tablet Take 81 mg by mouth every evening.   Marland Kitchen atorvastatin (LIPITOR) 80 MG tablet TAKE 1 TABLET EVERY DAY. PLEASE KEEP UPCOMING APPT.THANK YOU  . bisacodyl (DULCOLAX) 5 MG EC tablet Take 1 tablet (5 mg total) by mouth daily as needed for moderate constipation. (Patient taking differently: Take 5 mg by mouth daily. )  . buPROPion (WELLBUTRIN SR) 150 MG 12 hr tablet Take 1 tablet (150 mg total) by mouth 2 (two) times daily.  . cilostazol (PLETAL) 100 MG tablet 1 TABLET BY MOUTH TWICE A DAY PLEASE CALL/ SCHEDULE 1 YEAR FOLLOW UP APPOINTMENT FOR FURTHER REFILLS  . fluticasone (FLONASE) 50 MCG/ACT nasal spray USE 2 SPRAYS IN BOTH NOSTRILS DAILY.  Marland Kitchen Fluticasone-Salmeterol (ADVAIR DISKUS) 100-50 MCG/DOSE AEPB Inhale 1 puff into the lungs 2 (two) times daily.  Marland Kitchen guaiFENesin (MUCINEX) 600 MG 12 hr tablet Take 600 mg by mouth 2 (two) times daily.  . hydrOXYzine (ATARAX) 10 MG/5ML syrup Take 5 mLs (10 mg total) by mouth 3 (three) times daily.  Marland Kitchen ipratropium-albuterol (DUONEB) 0.5-2.5 (3) MG/3ML SOLN Take 3 mLs by nebulization every 6 (six) hours as needed. Use 3 times daily x 3 days, then every 6 hours as needed.  Marland Kitchen levothyroxine (SYNTHROID, LEVOTHROID) 112 MCG tablet TAKE ONE TABLET BY MOUTH ONE TIME DAILY  . LORazepam (ATIVAN) 2 MG tablet TAKE 1 TABLET BY MOUTH AT BEDTIME AS NEEDED FOR ANXIETY  . metoprolol tartrate (LOPRESSOR) 25 MG tablet TAKE 1 TABLET BY MOUTH 2 TIMES DAILY.  Marland Kitchen nystatin (MYCOSTATIN) 100000 UNIT/ML suspension Take 5 mLs (500,000 Units total) by mouth 4 (four) times daily.  Marland Kitchen omeprazole (PRILOSEC) 40 MG capsule TAKE 1 CAPSULE (40 MG TOTAL) BY MOUTH 2 (TWO) TIMES DAILY.  Marland Kitchen predniSONE  (DELTASONE) 10 MG tablet Take 4 tabs  daily with food x 4 days, then 3 tabs daily x 4 days, then 2 tabs daily x 4 days, then 1 tab daily x4 days then stop. #40   No current facility-administered medications on file prior to visit.      Allergies  Allergen Reactions  . Sulfonamide Derivatives Swelling    Swelling in face    Review Of Systems:  Constitutional:   No  weight loss, night sweats,  Fevers, chills, fatigue, or  lassitude.  HEENT:   No headaches,  Difficulty swallowing,  Tooth/dental problems, or  Sore throat,                No sneezing, itching, ear ache, +nasal congestion, +post nasal drip,   CV:  No chest pain,  Orthopnea, PND, swelling in lower extremities, anasarca, dizziness, palpitations, syncope.   GI  No heartburn, indigestion, abdominal pain, nausea, vomiting, diarrhea, change in bowel habits, loss of appetite, bloody stools.  Resp: + shortness of breath with exertion not  at rest.  No excess mucus, no productive cough,  + non-productive cough,  No coughing up of blood.  No change in color of mucus.  + wheezing.  No chest wall deformity  Skin: resolving rash to arms  GU: no dysuria, change in color of urine, no urgency or frequency.  No flank pain, no hematuria   MS:  No joint pain or swelling.  No decreased range of motion.  No back pain.  Psych:  No change in mood or affect. No depression or anxiety.  + memory loss.   Vital Signs BP 122/82 (BP Location: Left Arm, Cuff Size: Normal)   Pulse 92   Ht '5\' 5"'$  (1.651 m)   Wt 153 lb 6.4 oz (69.6 kg)   SpO2 93%   BMI 25.53 kg/m    Physical Exam:  General- No distress,  A&Ox3, pleasant and conversant ENT: No sinus tenderness, TM clear, pale nasal mucosa, no oral exudate,+ post nasal drip, no LAN Cardiac: S1, S2, regular rate and rhythm, no murmur Chest: Faint exp. wheeze/ no rales/ dullness; no accessory muscle use, no nasal flaring, no sternal retractions Abd.: Soft Non-tender, flat Ext: No clubbing  cyanosis, edema Neuro:  Alert and oriented 3 moving all extremities 4 deconditioned Skin: Resolving rashe to arms bilaterally, warm and dry, intact. Psych: normal mood and behavior   Assessment/Plan  Solitary pulmonary nodule Small nonspecific pulmonary nodules identified within the lower lobes measuring up to 4 mm. 08/2016 Plan: Will need follow up CT 08/2017  Chronic obstructive pulmonary disease ([place]) Resolving COPD flare requiring hospitalization 22 [phone] Ambulatory oximetry today in the office with saturation low of 91%, however averaged greater than 93%. Plan: Saturation goal is 88-92% Continue wearing your oxygen at night at 2 L [place] Continue prednisone taper as prescribed I the hospital until gone. Call [facility] and reschedule your research appointment. Continue your Advair 2 puff twice daily as you have been doing. Continue Duonebs every 6 hours as needed. Rinse mouth after use. Continue Nystatin mouth wash until 2/19 as instructed in discharge paperwork. Continue Mucinex as needed for chest congestion. Follow up appointment with Dr. Chase Caller as is scheduled for 12/31/2016. Please contact office for sooner follow up if symptoms do not improve or worsen or seek emergency care  .   Oral thrush Oral thrush upon admission to 12/04/2016 Resolving with use of nystatin mouthwash Plan Continue nystatin mouthwash through 12/03/2016 as instructed on discharge paperwork   Rash and nonspecific skin eruption Referral to dermatology  Allergic rhinitis Chronic allergic rhinitis which exacerbate COPD Plan Continue Flonase Consider allergy testing/referral to allergist  GERD GERD contributing to upper airway burden/ COPD trigger Plan: Continue Protonix twice daily as ordered     Magdalen Spatz, NP 11/28/2016  8:46 PM

## 2016-11-28 NOTE — Assessment & Plan Note (Signed)
Small nonspecific pulmonary nodules identified within the lower lobes measuring up to 4 mm. 08/2016 Plan: Will need follow up CT 08/2017

## 2016-11-28 NOTE — Assessment & Plan Note (Signed)
Referral to dermatology.  

## 2016-11-28 NOTE — Assessment & Plan Note (Signed)
Chronic allergic rhinitis which exacerbate COPD Plan Continue Flonase Consider allergy testing/referral to allergist

## 2016-11-28 NOTE — Assessment & Plan Note (Signed)
Resolving COPD flare requiring hospitalization 22 [phone] Ambulatory oximetry today in the office with saturation low of 91%, however averaged greater than 93%. Plan: Saturation goal is 88-92% Continue wearing your oxygen at night at 2 L [place] Continue prednisone taper as prescribed I the hospital until gone. Call [facility] and reschedule your research appointment. Continue your Advair 2 puff twice daily as you have been doing. Continue Duonebs every 6 hours as needed. Rinse mouth after use. Continue Nystatin mouth wash until 2/19 as instructed in discharge paperwork. Continue Mucinex as needed for chest congestion. Follow up appointment with Dr. Chase Caller as is scheduled for 12/31/2016. Please contact office for sooner follow up if symptoms do not improve or worsen or seek emergency care  .

## 2016-11-28 NOTE — Assessment & Plan Note (Signed)
GERD contributing to upper airway burden/ COPD trigger Plan: Continue Protonix twice daily as ordered

## 2016-12-10 ENCOUNTER — Encounter: Payer: Self-pay | Admitting: Internal Medicine

## 2016-12-10 DIAGNOSIS — L309 Dermatitis, unspecified: Secondary | ICD-10-CM | POA: Diagnosis not present

## 2016-12-10 DIAGNOSIS — J449 Chronic obstructive pulmonary disease, unspecified: Secondary | ICD-10-CM | POA: Diagnosis not present

## 2016-12-13 ENCOUNTER — Ambulatory Visit: Payer: Medicare Other | Admitting: Adult Health

## 2016-12-13 ENCOUNTER — Other Ambulatory Visit: Payer: Self-pay | Admitting: Internal Medicine

## 2016-12-14 ENCOUNTER — Other Ambulatory Visit: Payer: Self-pay | Admitting: Internal Medicine

## 2016-12-31 ENCOUNTER — Encounter: Payer: Self-pay | Admitting: Internal Medicine

## 2016-12-31 ENCOUNTER — Ambulatory Visit (INDEPENDENT_AMBULATORY_CARE_PROVIDER_SITE_OTHER): Payer: Medicare Other | Admitting: Internal Medicine

## 2016-12-31 DIAGNOSIS — R768 Other specified abnormal immunological findings in serum: Secondary | ICD-10-CM | POA: Diagnosis not present

## 2016-12-31 DIAGNOSIS — R21 Rash and other nonspecific skin eruption: Secondary | ICD-10-CM

## 2016-12-31 DIAGNOSIS — R7689 Other specified abnormal immunological findings in serum: Secondary | ICD-10-CM | POA: Insufficient documentation

## 2016-12-31 DIAGNOSIS — J449 Chronic obstructive pulmonary disease, unspecified: Secondary | ICD-10-CM

## 2016-12-31 MED ORDER — TIOTROPIUM BROMIDE MONOHYDRATE 2.5 MCG/ACT IN AERS: 2.0000 | INHALATION_SPRAY | Freq: Every day | RESPIRATORY_TRACT | 3 refills | Status: DC

## 2016-12-31 NOTE — Assessment & Plan Note (Signed)
Glad you are seeing Dr Allyson Sabal

## 2016-12-31 NOTE — Progress Notes (Signed)
Subjective:     Patient ID: [name], female   DOB: 08-23-1946, 71 y.o.   MRN: [id number]  HPI    OV 12/31/2016  Chief Complaint  Patient presents with  . Follow-up    Pt states her breahting is doing well. Pt denies cough and CP/tightness. Pt c/o acid reflux.      Follow-up Gold stage III COPD  She has recurrent exacerbation. Most recently was over a month ago when she was hospitalized. She currently feels baseline. She feels good. She is not on oxygen therapy. Reviewed her medication list and shows that she is only on Advair as control therapy for COPD. She has no idea why she is not on an anticholinergic. DuoNeb was listed but she says she only takes it sparingly as needed and since her discharge is not needing it. She has normal work and anticholinergic is. She also tells me that because of her eosinophilia and elevated IgE and seasonal allergies wants a referral to allergy clinic. She is seeing Dr. Allyson Sabal for her skin issues and she is feeling better.         Results for TAIRA, KNABE (MRN [id number]) as of 12/31/2016 12:33  Ref. Range 07/04/2016 05:26 07/05/2016 03:23 07/10/2016 05:13 11/16/2016 13:20 11/17/2016 05:57  Eosinophils Absolute Latest Ref Range: 0.0 - 0.7 K/uL    1.6 (H) 0.0    Results for AERIKA, GROLL (MRN [id number]) as of 12/31/2016 12:33  Ref. Range 11/16/2016 13:20  IgE (Immunoglobulin E), Serum Latest Ref Range: 0 - 100 IU/mL 180 (H)    has a past medical history of Cancer ([place]) (2010); Complication of anesthesia; COPD (chronic obstructive pulmonary disease) ([place]); Diverticulitis; Emphysema of lung ([place]); Fibromyalgia (1995); GERD (gastroesophageal reflux disease); Hearing loss of both ears; colonic polyps; Hyperlipidemia; Hypertension; Hypothyroidism; Osteoporosis; Peripheral vascular disease [facility]) (May '12 - CT angio); Shortness of breath dyspnea; Varicose veins of leg with swelling; and Wears glasses.   reports that she quit smoking about 9 months ago. Her smoking use  included Cigarettes. She has a 37.50 pack-year smoking history. She has never used smokeless tobacco.  Past Surgical History:  Procedure Laterality Date  . ABDOMINAL HYSTERECTOMY  1973   metorrhagia  . APPENDECTOMY    . BREAST SURGERY  2010   mastectomy with reconstructive surgery  . BREAST SURGERY  2010   rt lump x2  . BREAST SURGERY  2010   partial rt br reduction  . CARDIAC CATHETERIZATION  '11   no obstructive coronary disease  . COLECTOMY  2002   sigmoid  . COLONOSCOPY W/ POLYPECTOMY    . LUNG SURGERY  9/12   rt mid lung wedge  . PARTIAL MASTECTOMY WITH NEEDLE LOCALIZATION Left 02/23/2013   Procedure:  LEFT PARTIAL MASTECTOMY WITH NEEDLE LOCALIZATION;  Surgeon: Adin Hector, MD;  Location: [facility];  Service: General;  Laterality: Left;  . [place]   right upper lobe of lung as part of VAT-nodule/benign    Allergies  Allergen Reactions  . Sulfonamide Derivatives Swelling    Swelling in face    Immunization History  Administered Date(s) Administered  . Influenza,inj,Quad PF,36+ Mos 08/10/2014, 08/12/2015, 07/03/2016  . Pneumococcal Conjugate-13 08/10/2014  . Pneumococcal Polysaccharide-23 08/12/2015  . Td 10/16/2007  . Zoster 11/09/2010    Family History  Problem Relation Age of Onset  . Colon cancer Mother     colon  . COPD Mother     brown lung  . Hypertension  Mother   . Diabetes Mother   . Emphysema Mother   . Coronary artery disease Father   . Heart attack Father   . Sudden death Father   . Heart disease Father   . Cancer Sister     fallopian tube  . Hypertension Sister   . Coronary artery disease Brother   . Throat cancer Sister   . Melanoma Sister   . Hypertension Sister   . Pancreatic cancer Sister   . Cancer Sister   . Heart attack Brother     early 38's  . Heart failure Sister      Current Outpatient Prescriptions:  .  albuterol (PROVENTIL HFA;VENTOLIN HFA) 108 (90 Base) MCG/ACT inhaler, Inhale 2 puffs  into the lungs every 4 (four) hours as needed for wheezing. As a RESCUE medication, Disp: 1 Inhaler, Rfl: 1 .  amitriptyline (ELAVIL) 50 MG tablet, TAKE 1 TABLET (50 MG TOTAL) BY MOUTH AT BEDTIME., Disp: 90 tablet, Rfl: 3 .  aspirin 81 MG tablet, Take 81 mg by mouth every evening. , Disp: , Rfl:  .  atorvastatin (LIPITOR) 80 MG tablet, TAKE 1 TABLET EVERY DAY. PLEASE KEEP UPCOMING APPT.THANK YOU, Disp: 90 tablet, Rfl: 3 .  bisacodyl (DULCOLAX) 5 MG EC tablet, Take 1 tablet (5 mg total) by mouth daily as needed for moderate constipation. (Patient taking differently: Take 5 mg by mouth daily. ), Disp: 30 tablet, Rfl: 11 .  buPROPion (WELLBUTRIN SR) 150 MG 12 hr tablet, Take 1 tablet (150 mg total) by mouth 2 (two) times daily., Disp: 180 tablet, Rfl: 1 .  cilostazol (PLETAL) 100 MG tablet, 1 TABLET BY MOUTH TWICE A DAY PLEASE CALL/ SCHEDULE 1 YEAR FOLLOW UP APPOINTMENT FOR FURTHER REFILLS, Disp: 180 tablet, Rfl: 0 .  diclofenac (VOLTAREN) 75 MG EC tablet, TAKE ONE TABLET BY MOUTH TWICE DAILY, Disp: 60 tablet, Rfl: 7 .  fluticasone (FLONASE) 50 MCG/ACT nasal spray, USE 2 SPRAYS IN BOTH NOSTRILS DAILY., Disp: 48 g, Rfl: 3 .  Fluticasone-Salmeterol (ADVAIR DISKUS) 100-50 MCG/DOSE AEPB, Inhale 1 puff into the lungs 2 (two) times daily., Disp: 180 each, Rfl: 1 .  guaiFENesin (MUCINEX) 600 MG 12 hr tablet, Take 600 mg by mouth 2 (two) times daily., Disp: , Rfl:  .  hydrOXYzine (ATARAX/VISTARIL) 10 MG tablet, Take 10 mg by mouth 3 (three) times daily as needed., Disp: , Rfl:  .  ipratropium-albuterol (DUONEB) 0.5-2.5 (3) MG/3ML SOLN, Take 3 mLs by nebulization every 6 (six) hours as needed. Use 3 times daily x 3 days, then every 6 hours as needed., Disp: 360 mL, Rfl: 2 .  levothyroxine (SYNTHROID, LEVOTHROID) 112 MCG tablet, TAKE ONE TABLET BY MOUTH ONE TIME DAILY, Disp: 90 tablet, Rfl: 3 .  LORazepam (ATIVAN) 2 MG tablet, TAKE 1 TABLET BY MOUTH AT BEDTIME AS NEEDED FOR ANXIETY, Disp: 30 tablet, Rfl: 2 .   metoprolol tartrate (LOPRESSOR) 25 MG tablet, TAKE 1 TABLET BY MOUTH 2 TIMES DAILY., Disp: 180 tablet, Rfl: 3 .  omeprazole (PRILOSEC) 40 MG capsule, TAKE 1 CAPSULE (40 MG TOTAL) BY MOUTH 2 (TWO) TIMES DAILY., Disp: 180 capsule, Rfl: 3 .  amLODipine (NORVASC) 5 MG tablet, Take 1 tablet (5 mg total) by mouth daily., Disp: 90 tablet, Rfl: 1    Review of Systems     Objective:   Physical Exam  Constitutional: She is oriented to person, place, and time. She appears well-developed and well-nourished. No distress.  HENT:  Head: Normocephalic and atraumatic.  Right Ear: External  ear normal.  Left Ear: External ear normal.  Mouth/Throat: Oropharynx is clear and moist. No oropharyngeal exudate.  Eyes: Conjunctivae and EOM are normal. Pupils are equal, round, and reactive to light. Right eye exhibits no discharge. Left eye exhibits no discharge. No scleral icterus.  Neck: Normal range of motion. Neck supple. No JVD present. No tracheal deviation present. No thyromegaly present.  Cardiovascular: Normal rate, regular rhythm, normal heart sounds and intact distal pulses.  Exam reveals no gallop and no friction rub.   No murmur heard. Pulmonary/Chest: Effort normal and breath sounds normal. No respiratory distress. She has no wheezes. She has no rales. She exhibits no tenderness.  Abdominal: Soft. Bowel sounds are normal. She exhibits no distension and no mass. There is no tenderness. There is no rebound and no guarding.  Musculoskeletal: Normal range of motion. She exhibits no edema or tenderness.  Lymphadenopathy:    She has no cervical adenopathy.  Neurological: She is alert and oriented to person, place, and time. She has normal reflexes. No cranial nerve deficit. She exhibits normal muscle tone. Coordination normal.  Skin: Skin is warm and dry. No rash noted. She is not diaphoretic. No erythema. No pallor.  Psychiatric: She has a normal mood and affect. Her behavior is normal. Judgment and thought  content normal.  Vitals reviewed.  Vitals:   12/31/16 1226  BP: 112/80  Pulse: 75  SpO2: 95%  Weight: 151 lb 3.2 oz (68.6 kg)  Height: '5\' 5"'$  (1.651 m)    Estimated body mass index is 25.16 kg/m as calculated from the following:   Height as of this encounter: '5\' 5"'$  (1.651 m).   Weight as of this encounter: 151 lb 3.2 oz (68.6 kg).     Assessment:       ICD-9-CM ICD-10-CM   1. Rash and nonspecific skin eruption 782.1 R21   2. Elevated IgE level and eosinophilia 795.79 R76.8   3. Chronic obstructive pulmonary disease, unspecified COPD type ([place]) 496 J44.9        Plan:     Chronic obstructive pulmonary disease ([place]) Currently stable disease  Plan - continue advair daily as before - add spiriva respimat or incruse daily - use albuterol as needed  Followup - 8 weeks to see progress; consider adding daliresp if needed to prevent flareups   Rash and nonspecific skin eruption Glad you are seeing Dr Allyson Sabal  Elevated IgE level and eosinophilia Refer to [place] allergy clinic of Drs Donneta Romberg, Fredderick Phenix and Orvil Feil    Dr. Brand Males, M.D., F.C.C.P Pulmonary and Critical Care Medicine Staff Physician [facility] Pager: [phone], If no answer or between  15:00h - 7:00h: call [phone]  12/31/2016 12:44 PM

## 2016-12-31 NOTE — Assessment & Plan Note (Signed)
Refer to [facility] allergy clinic of Drs Donneta Romberg, Fredderick Phenix and Orvil Feil

## 2016-12-31 NOTE — Assessment & Plan Note (Addendum)
Currently stable disease  Plan - continue advair daily as before - add spiriva respimat or incruse daily - use albuterol as needed  Followup - 8 weeks to see progress; consider adding daliresp if needed to prevent flareups

## 2016-12-31 NOTE — Patient Instructions (Addendum)
Chronic obstructive pulmonary disease ([facility]) Currently stable disease  Plan - continue advair daily as before - add spiriva respimat or incruse daily - use albuterol as needed   Rash and nonspecific skin eruption Glad you are seeing Dr Allyson Sabal  Elevated IgE level and eosinophilia Refer to [place] allergy clinic of Drs Donneta Romberg, Fredderick Phenix and Orvil Feil  Followu 8 weeks to see progress; consider adding daliresp if needed to prevent flareups

## 2017-01-07 DIAGNOSIS — J449 Chronic obstructive pulmonary disease, unspecified: Secondary | ICD-10-CM | POA: Diagnosis not present

## 2017-01-14 ENCOUNTER — Other Ambulatory Visit: Payer: Self-pay | Admitting: Dermatology

## 2017-01-14 DIAGNOSIS — R238 Other skin changes: Secondary | ICD-10-CM | POA: Diagnosis not present

## 2017-01-14 DIAGNOSIS — J309 Allergic rhinitis, unspecified: Secondary | ICD-10-CM | POA: Diagnosis not present

## 2017-01-14 DIAGNOSIS — L309 Dermatitis, unspecified: Secondary | ICD-10-CM | POA: Diagnosis not present

## 2017-01-24 ENCOUNTER — Other Ambulatory Visit: Payer: Self-pay | Admitting: Cardiology

## 2017-01-24 DIAGNOSIS — I739 Peripheral vascular disease, unspecified: Secondary | ICD-10-CM

## 2017-01-28 ENCOUNTER — Other Ambulatory Visit: Payer: Self-pay | Admitting: Internal Medicine

## 2017-01-28 DIAGNOSIS — J439 Emphysema, unspecified: Secondary | ICD-10-CM

## 2017-01-28 DIAGNOSIS — L239 Allergic contact dermatitis, unspecified cause: Secondary | ICD-10-CM | POA: Diagnosis not present

## 2017-01-28 DIAGNOSIS — L0201 Cutaneous abscess of face: Secondary | ICD-10-CM | POA: Diagnosis not present

## 2017-01-28 NOTE — Telephone Encounter (Signed)
Should come from her pulmonary provider, she can call them for refills.

## 2017-01-28 NOTE — Telephone Encounter (Signed)
Please advise, pcp hasn't perscribed

## 2017-01-29 DIAGNOSIS — R1313 Dysphagia, pharyngeal phase: Secondary | ICD-10-CM | POA: Diagnosis not present

## 2017-01-29 DIAGNOSIS — J310 Chronic rhinitis: Secondary | ICD-10-CM | POA: Diagnosis not present

## 2017-02-04 NOTE — Progress Notes (Signed)
Pre and Post completed.

## 2017-02-07 ENCOUNTER — Other Ambulatory Visit: Payer: Self-pay | Admitting: Internal Medicine

## 2017-02-07 DIAGNOSIS — J449 Chronic obstructive pulmonary disease, unspecified: Secondary | ICD-10-CM | POA: Diagnosis not present

## 2017-02-08 ENCOUNTER — Other Ambulatory Visit: Payer: Medicare Other

## 2017-02-08 ENCOUNTER — Encounter: Payer: Self-pay | Admitting: Internal Medicine

## 2017-02-08 ENCOUNTER — Ambulatory Visit (INDEPENDENT_AMBULATORY_CARE_PROVIDER_SITE_OTHER): Payer: Medicare Other | Admitting: Internal Medicine

## 2017-02-08 VITALS — BP 100/70 | HR 91 | Temp 97.7°F | Ht 65.0 in | Wt 157.5 lb

## 2017-02-08 DIAGNOSIS — R768 Other specified abnormal immunological findings in serum: Secondary | ICD-10-CM | POA: Diagnosis not present

## 2017-02-08 NOTE — Progress Notes (Signed)
   Subjective:    Patient ID: [name], female    DOB: 11-Jun-1946, 71 y.o.   MRN: [id number]  HPI The patient is a 71 YO female coming in for abnormal lab work with her dermatologist. She has had elevated ANA at their office and they wanted her to follow up with up. No new symptoms, taking prednisone currently for a rash. She does have her breathing problems which are stable. No fevers or chills. No weight change. Denies new arthralgia.   Review of Systems  Constitutional: Negative.   Respiratory: Negative.   Cardiovascular: Negative.   Gastrointestinal: Negative.   Musculoskeletal: Negative.   Neurological: Negative.       Objective:   Physical Exam  Constitutional: She is oriented to person, place, and time. She appears well-developed and well-nourished.  HENT:  Head: Normocephalic and atraumatic.  Eyes: EOM are normal.  Cardiovascular: Normal rate.   Pulmonary/Chest: Effort normal. No respiratory distress. She has no wheezes. She has no rales.  Abdominal: Soft.  Neurological: She is alert and oriented to person, place, and time.  Skin: Skin is warm and dry.   Vitals:   02/08/17 1458  BP: 100/70  Pulse: 91  Temp: 97.7 F (36.5 C)  TempSrc: Oral  SpO2: 98%  Weight: 157 lb 8 oz (71.4 kg)  Height: '5\' 5"'$  (1.651 m)      Assessment & Plan:

## 2017-02-08 NOTE — Progress Notes (Signed)
Pre visit review using our clinic review tool, if applicable. No additional management support is needed unless otherwise documented below in the visit note. 

## 2017-02-08 NOTE — Assessment & Plan Note (Signed)
More likely to be false positive although she does have skin rash. Will recheck ANA since we do not have results for titer values as well as several common rheumatoid conditions.

## 2017-02-11 LAB — RHEUMATOID FACTOR: Rhuematoid fact SerPl-aCnc: <14 IU/mL (ref <14)

## 2017-02-11 LAB — ANTI-NUCLEAR AB-TITER (ANA TITER): ANA Titer 1: 1:160 {titer} — ABNORMAL HIGH

## 2017-02-11 LAB — SJOGRENS SYNDROME-A EXTRACTABLE NUCLEAR ANTIBODY: SSA (Ro) (ENA) Antibody, IgG: <1

## 2017-02-11 LAB — ANA: Anti Nuclear Antibody(ANA): POSITIVE — AB

## 2017-02-11 LAB — SJOGRENS SYNDROME-B EXTRACTABLE NUCLEAR ANTIBODY: SSB (La) (ENA) Antibody, IgG: <1

## 2017-02-26 ENCOUNTER — Ambulatory Visit (INDEPENDENT_AMBULATORY_CARE_PROVIDER_SITE_OTHER): Payer: Medicare Other | Admitting: Internal Medicine

## 2017-02-26 DIAGNOSIS — R600 Localized edema: Secondary | ICD-10-CM | POA: Insufficient documentation

## 2017-02-26 DIAGNOSIS — R768 Other specified abnormal immunological findings in serum: Secondary | ICD-10-CM

## 2017-02-26 DIAGNOSIS — R21 Rash and other nonspecific skin eruption: Secondary | ICD-10-CM | POA: Diagnosis not present

## 2017-02-26 DIAGNOSIS — J449 Chronic obstructive pulmonary disease, unspecified: Secondary | ICD-10-CM

## 2017-02-26 MED ORDER — FLUTICASONE-UMECLIDIN-VILANT 100-62.5-25 MCG/INH IN AEPB: 1.0000 | INHALATION_SPRAY | Freq: Every day | RESPIRATORY_TRACT | 0 refills | Status: DC

## 2017-02-26 NOTE — Assessment & Plan Note (Signed)
New issue for this visit 02/26/2017  Plan Urgent bilateral duplex venous doppler leg 02/26/2017 or 02/27/17 If worsening or wrsening shortness of breath go to ER due to risk of blood clot in lung

## 2017-02-26 NOTE — Assessment & Plan Note (Signed)
Glad you are seeing allergist

## 2017-02-26 NOTE — Patient Instructions (Signed)
Edema of right lower extremity New issue for this visit 02/26/2017  Plan Urgent bilateral duplex venous doppler leg 02/26/2017 or 02/27/17 If worsening or wrsening shortness of breath go to ER due to risk of blood clot in lung  Elevated IgE level and eosinophilia Glad you are seeing allergist  Rash and nonspecific skin eruption Glad you are seeing Dr Allyson Sabal  Stage 3 severe COPD by GOLD classification ([place]) Stable disease but history of frequent flareups  Plan Change spiriva and advair to daily TRELEGY sample for 1 month  call in 2 weeks with response If unable to get insurance approval then do spiriva and breo instead  Followup 3 months or sooner if needed

## 2017-02-26 NOTE — Progress Notes (Signed)
Subjective:     Patient ID: PRISTINE GLADHILL, female   DOB: [birth year], 71 y.o.   MRN: [id number]  HPI    OV 12/31/2016  Chief Complaint  Patient presents with  . Follow-up    Pt states her breahting is doing well. Pt denies cough and CP/tightness. Pt c/o acid reflux.      Follow-up Gold stage III COPD  She has recurrent exacerbation. Most recently was over a month ago when she was hospitalized. She currently feels baseline. She feels good. She is not on oxygen therapy. Reviewed her medication list and shows that she is only on Advair as control therapy for COPD. She has no idea why she is not on an anticholinergic. DuoNeb was listed but she says she only takes it sparingly as needed and since her discharge is not needing it. She has normal work and anticholinergic is. She also tells me that because of her eosinophilia and elevated IgE and seasonal allergies wants a referral to allergy clinic. She is seeing Dr. Allyson Sabal for her skin issues and she is feeling better.         Results for KEONDRA, HAYDU (MRN [id number]) as of 12/31/2016 12:33  Ref. Range 07/04/2016 05:26 07/05/2016 03:23 07/10/2016 05:13 11/16/2016 13:20 11/17/2016 05:57  Eosinophils Absolute Latest Ref Range: 0.0 - 0.7 K/uL    1.6 (H) 0.0    Results for GABRIELLE, WAKELAND (MRN [id number]) as of 12/31/2016 12:33  Ref. Range 11/16/2016 13:20  IgE (Immunoglobulin E), Serum Latest Ref Range: 0 - 100 IU/mL 180 (H)    OV 02/26/2017  Chief Complaint  Patient presents with  . Follow-up    FOLLOW UP FOR patient states that she is here for SOB and her voice is horse and she has wheezing all the time patient denies c/p or tightness     Follow-up Gold stage III COPD: She is on triple inhaler therapy with Spiriva and Advair. Has COPD stable. She has elevated IgE and eosinophilia. She has seen allergist and then got referred to Dr. Lucia Gaskins ENT who apparently treated her for pharyngeal or laryngeal thrush and this has helped. She also has skin rash. I  referred her to Dr. Allyson Sabal and she's tried some prednisone course and has had skin biopsy with these things have not helped. Nevertheless she feels she is under good care. Overall in terms of COPD she stable but she does have significant cough and is a history of frequent exacerbations. But she does not think she is an exacerbation today  The new issue appears to be that she has last few days she has right lower extremity swelling greater than the left. There is some associated warmth   has a past medical history of Cancer ([place]) (2010); Complication of anesthesia; COPD (chronic obstructive pulmonary disease) ([place]); Diverticulitis; Emphysema of lung ([place]); Fibromyalgia (1995); GERD (gastroesophageal reflux disease); Hearing loss of both ears; colonic polyps; Hyperlipidemia; Hypertension; Hypothyroidism; Osteoporosis; Peripheral vascular disease [facility]) (May '12 - CT angio); Shortness of breath dyspnea; Varicose veins of leg with swelling; and Wears glasses.   reports that she quit smoking about a year ago. Her smoking use included Cigarettes. She has a 37.50 pack-year smoking history. She has never used smokeless tobacco.  Past Surgical History:  Procedure Laterality Date  . ABDOMINAL HYSTERECTOMY  1973   metorrhagia  . APPENDECTOMY    . BREAST SURGERY  2010   mastectomy with reconstructive surgery  . BREAST SURGERY  2010   rt lump x2  .  BREAST SURGERY  2010   partial rt br reduction  . CARDIAC CATHETERIZATION  '11   no obstructive coronary disease  . COLECTOMY  2002   sigmoid  . COLONOSCOPY W/ POLYPECTOMY    . LUNG SURGERY  9/12   rt mid lung wedge  . PARTIAL MASTECTOMY WITH NEEDLE LOCALIZATION Left 02/23/2013   Procedure:  LEFT PARTIAL MASTECTOMY WITH NEEDLE LOCALIZATION;  Surgeon: Adin Hector, MD;  Location: [place];  Service: General;  Laterality: Left;  . [place]   right upper lobe of lung as part of VAT-nodule/benign    Allergies  Allergen  Reactions  . Sulfonamide Derivatives Swelling    Swelling in face    Immunization History  Administered Date(s) Administered  . Influenza,inj,Quad PF,36+ Mos 08/10/2014, 08/12/2015, 07/03/2016  . Pneumococcal Conjugate-13 08/10/2014  . Pneumococcal Polysaccharide-23 08/12/2015  . Td 10/16/2007  . Zoster 11/09/2010    Family History  Problem Relation Age of Onset  . Colon cancer Mother        colon  . COPD Mother        brown lung  . Hypertension Mother   . Diabetes Mother   . Emphysema Mother   . Coronary artery disease Father   . Heart attack Father   . Sudden death Father   . Heart disease Father   . Cancer Sister        fallopian tube  . Hypertension Sister   . Coronary artery disease Brother   . Throat cancer Sister   . Melanoma Sister   . Hypertension Sister   . Pancreatic cancer Sister   . Cancer Sister   . Heart attack Brother        early 76's  . Heart failure Sister      Current Outpatient Prescriptions:  .  ADVAIR DISKUS 100-50 MCG/DOSE AEPB, INHALE 1 PUFF INTO THE LUNGS 2 TIMES DAILY., Disp: 180 each, Rfl: 1 .  albuterol (PROVENTIL HFA;VENTOLIN HFA) 108 (90 Base) MCG/ACT inhaler, Inhale 2 puffs into the lungs every 4 (four) hours as needed for wheezing. As a RESCUE medication, Disp: 1 Inhaler, Rfl: 1 .  amitriptyline (ELAVIL) 50 MG tablet, TAKE 1 TABLET (50 MG TOTAL) BY MOUTH AT BEDTIME., Disp: 90 tablet, Rfl: 3 .  aspirin 81 MG tablet, Take 81 mg by mouth every evening. , Disp: , Rfl:  .  atorvastatin (LIPITOR) 80 MG tablet, TAKE 1 TABLET EVERY DAY. PLEASE KEEP UPCOMING APPT.THANK YOU, Disp: 90 tablet, Rfl: 3 .  bisacodyl (DULCOLAX) 5 MG EC tablet, Take 1 tablet (5 mg total) by mouth daily as needed for moderate constipation. (Patient taking differently: Take 5 mg by mouth daily. ), Disp: 30 tablet, Rfl: 11 .  buPROPion (WELLBUTRIN SR) 150 MG 12 hr tablet, Take 1 tablet (150 mg total) by mouth 2 (two) times daily., Disp: 180 tablet, Rfl: 1 .  cilostazol  (PLETAL) 100 MG tablet, Take 1 tablet (100 mg total) by mouth 2 (two) times daily., Disp: 180 tablet, Rfl: 1 .  diclofenac (VOLTAREN) 75 MG EC tablet, TAKE ONE TABLET BY MOUTH TWICE DAILY, Disp: 60 tablet, Rfl: 7 .  fluticasone (FLONASE) 50 MCG/ACT nasal spray, USE 2 SPRAYS IN BOTH NOSTRILS DAILY., Disp: 48 g, Rfl: 3 .  Fluticasone-Salmeterol (ADVAIR DISKUS) 100-50 MCG/DOSE AEPB, Inhale 1 puff into the lungs 2 (two) times daily., Disp: 180 each, Rfl: 1 .  guaiFENesin (MUCINEX) 600 MG 12 hr tablet, Take 600 mg by mouth 2 (two) times  daily., Disp: , Rfl:  .  hydrOXYzine (ATARAX/VISTARIL) 10 MG tablet, Take 10 mg by mouth 3 (three) times daily as needed., Disp: , Rfl:  .  ipratropium-albuterol (DUONEB) 0.5-2.5 (3) MG/3ML SOLN, USE 1 NEB 3 TIMES DAILY FOR 3 DAYS, THEN EVERY 6 HOURS AS NEEDED., Disp: 360 mL, Rfl: 0 .  levothyroxine (SYNTHROID, LEVOTHROID) 112 MCG tablet, TAKE ONE TABLET BY MOUTH ONE TIME DAILY, Disp: 90 tablet, Rfl: 3 .  LORazepam (ATIVAN) 2 MG tablet, TAKE 1 TABLET BY MOUTH AT BEDTIME AS NEEDED FOR ANXIETY, Disp: 30 tablet, Rfl: 2 .  metoprolol tartrate (LOPRESSOR) 25 MG tablet, TAKE 1 TABLET BY MOUTH 2 TIMES DAILY., Disp: 180 tablet, Rfl: 3 .  omeprazole (PRILOSEC) 40 MG capsule, TAKE 1 CAPSULE (40 MG TOTAL) BY MOUTH 2 (TWO) TIMES DAILY., Disp: 180 capsule, Rfl: 3 .  Tiotropium Bromide Monohydrate (SPIRIVA RESPIMAT) 2.5 MCG/ACT AERS, Inhale 2 puffs into the lungs daily., Disp: 1 Inhaler, Rfl: 3 .  amLODipine (NORVASC) 5 MG tablet, Take 1 tablet (5 mg total) by mouth daily., Disp: 90 tablet, Rfl: 1    Review of Systems     Objective:   Physical Exam  Constitutional: She is oriented to person, place, and time. She appears well-developed and well-nourished. No distress.  HENT:  Head: Normocephalic and atraumatic.  Right Ear: External ear normal.  Left Ear: External ear normal.  Mouth/Throat: Oropharynx is clear and moist. No oropharyngeal exudate.  Eyes: Conjunctivae and EOM are  normal. Pupils are equal, round, and reactive to light. Right eye exhibits no discharge. Left eye exhibits no discharge. No scleral icterus.  Neck: Normal range of motion. Neck supple. No JVD present. No tracheal deviation present. No thyromegaly present.  Cardiovascular: Normal rate, regular rhythm, normal heart sounds and intact distal pulses.  Exam reveals no gallop and no friction rub.   No murmur heard. Pulmonary/Chest: Effort normal and breath sounds normal. No respiratory distress. She has no wheezes. She has no rales. She exhibits no tenderness.  Distant breath sounds but no wheeze  Abdominal: Soft. Bowel sounds are normal. She exhibits no distension and no mass. There is no tenderness. There is no rebound and no guarding.  Musculoskeletal: Normal range of motion. She exhibits edema. She exhibits no tenderness.  Right lower extremity edema with some warmth  Lymphadenopathy:    She has no cervical adenopathy.  Neurological: She is alert and oriented to person, place, and time. She has normal reflexes. No cranial nerve deficit. She exhibits normal muscle tone. Coordination normal.  Skin: Skin is warm and dry. Rash noted. She is not diaphoretic. No erythema. No pallor.  Psychiatric: She has a normal mood and affect. Her behavior is normal. Judgment and thought content normal.  Vitals reviewed.  Vitals:   02/26/17 1227  BP: 102/60  Pulse: 87  Resp: 16  SpO2: 95%  Weight: 157 lb 3.2 oz (71.3 kg)  Height: '5\' 6"'$  (1.676 m)   Estimated body mass index is 25.37 kg/m as calculated from the following:   Height as of this encounter: '5\' 6"'$  (1.676 m).   Weight as of this encounter: 157 lb 3.2 oz (71.3 kg).     Assessment:       ICD-9-CM ICD-10-CM   1. Edema of right lower extremity 782.3 R60.0 VAS [place] LOWER EXTREMITY VENOUS (DVT)  2. Elevated IgE level and eosinophilia 795.79 R76.8   3. Rash and nonspecific skin eruption 782.1 R21   4. Stage 3 severe COPD by GOLD classification (  [place]) 496  J44.9        Plan:     Edema of right lower extremity New issue for this visit 02/26/2017  Plan Urgent bilateral duplex venous doppler leg 02/26/2017 or 02/27/17 If worsening or wrsening shortness of breath go to ER due to risk of blood clot in lung  Elevated IgE level and eosinophilia Glad you are seeing allergist  Rash and nonspecific skin eruption Glad you are seeing Dr Allyson Sabal  Stage 3 severe COPD by GOLD classification ([place]) Stable disease but history of frequent flareups  Plan Change spiriva and advair to daily TRELEGY sample for 1 month  call in 2 weeks with response If unable to get insurance approval then do spiriva and breo instead  Followup 3 months or sooner if needed    Dr. Brand Males, M.D., [facility].C.P Pulmonary and Critical Care Medicine Staff Physician [place] [facility] Pager: [phone], If no answer or between  15:00h - 7:00h: call [phone]  02/26/2017 12:50 PM

## 2017-02-26 NOTE — Assessment & Plan Note (Signed)
Glad you are seeing Dr Allyson Sabal

## 2017-02-26 NOTE — Assessment & Plan Note (Signed)
Stable disease but history of frequent flareups  Plan Change spiriva and advair to daily TRELEGY sample for 1 month  call in 2 weeks with response If unable to get insurance approval then do spiriva and breo instead  Followup 3 months or sooner if needed

## 2017-02-27 ENCOUNTER — Telehealth: Payer: Self-pay | Admitting: Internal Medicine

## 2017-02-27 ENCOUNTER — Ambulatory Visit (HOSPITAL_COMMUNITY)
Admission: RE | Admit: 2017-02-27 | Discharge: 2017-02-27 | Disposition: A | Discharge status: Home / Self Care | Payer: Medicare Other | Source: Ambulatory Visit | Attending: Cardiovascular Disease | Admitting: Cardiovascular Disease

## 2017-02-27 DIAGNOSIS — J449 Chronic obstructive pulmonary disease, unspecified: Secondary | ICD-10-CM | POA: Insufficient documentation

## 2017-02-27 DIAGNOSIS — I739 Peripheral vascular disease, unspecified: Secondary | ICD-10-CM | POA: Insufficient documentation

## 2017-02-27 DIAGNOSIS — E785 Hyperlipidemia, unspecified: Secondary | ICD-10-CM | POA: Insufficient documentation

## 2017-02-27 DIAGNOSIS — I2510 Atherosclerotic heart disease of native coronary artery without angina pectoris: Secondary | ICD-10-CM | POA: Insufficient documentation

## 2017-02-27 DIAGNOSIS — R600 Localized edema: Secondary | ICD-10-CM | POA: Diagnosis not present

## 2017-02-27 DIAGNOSIS — I10 Essential (primary) hypertension: Secondary | ICD-10-CM | POA: Diagnosis not present

## 2017-02-27 DIAGNOSIS — I82591 Chronic embolism and thrombosis of other specified deep vein of right lower extremity: Secondary | ICD-10-CM | POA: Diagnosis not present

## 2017-02-27 DIAGNOSIS — R601 Generalized edema: Secondary | ICD-10-CM | POA: Diagnosis not present

## 2017-02-27 DIAGNOSIS — Z87891 Personal history of nicotine dependence: Secondary | ICD-10-CM | POA: Diagnosis not present

## 2017-02-27 NOTE — Telephone Encounter (Signed)
Spoke with pt, aware of recs. Dr. Sharlet Salina is in [place] and has access to results.  Will close encounter.

## 2017-02-27 NOTE — Telephone Encounter (Signed)
Duplex shows chronic right lowe extremity clot. Not acute. Please send result to PCP Hoyt Koch, MD and she is to address this. I am copying her too. Patient on PLETAL nand aspirin  Let patien tknow  Dr. Brand Males, M.D., [facility].C.P Pulmonary and Critical Care Medicine Staff Physician [place] [facility] Pager: [phone], If no answer or between  15:00h - 7:00h: call [phone]  02/27/2017 2:32 PM

## 2017-02-28 NOTE — Telephone Encounter (Signed)
Please have patient schedule visit with [place] to evaluate. Ordering provider is responsible for test results and treatment. If you feel uncomfortable treating you would need to contact directly to ask [place] to treat.

## 2017-03-01 ENCOUNTER — Telehealth: Payer: Self-pay | Admitting: Internal Medicine

## 2017-03-01 NOTE — Telephone Encounter (Signed)
Called and spoke to pt. Informed her of the results and recs per MR. Appt made with SG on 03/06/17. Pt verbalized understanding and denied any further questions or concerns at this time.

## 2017-03-01 NOTE — Telephone Encounter (Signed)
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 Because this is chronic I did not Rx it . I only ordered test fearing it was acute. Ensure patient is taking her aspirin '81mg'$  per day. Please get her in with PCP or an APP in that office next few to several days. If this gets worse, patient is to go to ER  Dr. Brand Males, M.D., [facility].C.P Pulmonary and Critical Care Medicine Staff Physician [place] [facility] Pager: [phone], If no answer or between  15:00h - 7:00h: call [phone]  03/01/2017 3:53 PM

## 2017-03-06 ENCOUNTER — Encounter: Payer: Self-pay | Admitting: Acute Care

## 2017-03-06 ENCOUNTER — Other Ambulatory Visit: Payer: Self-pay | Admitting: Acute Care

## 2017-03-06 ENCOUNTER — Ambulatory Visit (INDEPENDENT_AMBULATORY_CARE_PROVIDER_SITE_OTHER): Payer: Medicare Other | Admitting: Acute Care

## 2017-03-06 DIAGNOSIS — R911 Solitary pulmonary nodule: Secondary | ICD-10-CM

## 2017-03-06 DIAGNOSIS — R600 Localized edema: Secondary | ICD-10-CM | POA: Diagnosis not present

## 2017-03-06 DIAGNOSIS — J9621 Acute and chronic respiratory failure with hypoxia: Secondary | ICD-10-CM | POA: Diagnosis not present

## 2017-03-06 DIAGNOSIS — L239 Allergic contact dermatitis, unspecified cause: Secondary | ICD-10-CM | POA: Diagnosis not present

## 2017-03-06 NOTE — Assessment & Plan Note (Addendum)
Lower extremity Dopplers indicated a chronic DVT and right lower extremity. Results reported to Dr. Chase Caller has chosen not to treat as not acute DVT Patient is currently diligent with daily aspirin treatment Plan We will refer you to a vascular specialist for assessment of your chronic dvt. Continue taking daily ASA without fail  Consider full dose ASA. Follow up with cardiology to evaluate for worsening heart failure. For any sudden shortness of breath or chest pain go to the emergency room. Follow up with Dr. Chase Caller in 3 months Please contact office for sooner follow up if symptoms do not improve or worsen or seek emergency care

## 2017-03-06 NOTE — Progress Notes (Addendum)
History of Present Illness [name] is a 71 y.o. female former smoker ( Quit 2017/ 34.5 pack year smoking history) with Gold stage III COPD ( alpha-1 MM with positive BD response at baseline)  and OSA.Marland Kitchen She uses  home oxygen  with exertion, and with sleep. She is followed by Dr. Chase Caller.   03/07/2017 Follow UP OV for Chronic DVT:  Pt. Was seen by Dr. Chase Caller 02/26/2017 for leg swelling. He was concerned about possibility of acute DVT and ordered a doppler study. The patient did not have any chest pain or shortness of breath at the time. 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 Dr. Chase Caller chose not to treat as  this is a chronic problem. He wanted the patient to be diligent with her ASA daily.The patient returns today for follow up. She continues to have no respiratory issues. Her COPD is stable and at baseline. She does  not have any chest pain, or tachycardia. She states her baseline dyspnea that she experiences with her COPD  is unchanged. She continues to have issues with bilateral leg swelling.She is not wearing her oxygen with exertion as prescribed . She states she is wearing it at night. We did discuss the additional strain  placed on the heart when oxygen levels fall below 88%, and the importance of compliance with oxygen on exertion. Most recent hospital admission for COPD exacerbation 11/2016. She was prophylaxed with heparin for DVT prophylaxis at that time.    Test Results:   02/27/2017: Vascular US Lower Extremities Evidence of right lower extremity chronic, non occlusive, deep venous thrombus in one of the paired peroneal veins with incompetency.  No evidence of left lower extremity deep or superficial venous thrombus or incompetence.  07/09/2017 Echo: EF 17-40%, Grade 1 diastolic dysfunction  CBC Latest Ref Rng & Units 11/17/2016 11/16/2016 07/10/2016  WBC 4.0 - 10.5 K/uL 5.9 10.5 8.8  Hemoglobin 12.0 - 15.0 g/dL 11.5(L) 13.0 14.5  Hematocrit 36.0  - 46.0 % 35.2(L) 40.9 44.9  Platelets 150 - 400 K/uL 391 410(H) 413(H)    BMP Latest Ref Rng & Units 11/17/2016 11/16/2016 07/10/2016  Glucose 65 - 99 mg/dL 163(H) - 109(H)  BUN 6 - 20 mg/dL 24(H) - 19  Creatinine 0.44 - 1.00 mg/dL 0.93 0.89 0.86  Sodium 135 - 145 mmol/L 137 - 140  Potassium 3.5 - 5.1 mmol/L 4.3 - 3.5  Chloride 101 - 111 mmol/L 104 - 100(L)  CO2 22 - 32 mmol/L 24 - 33(H)  Calcium 8.9 - 10.3 mg/dL 8.9 - 9.0    BNP    Component Value Date/Time   BNP 28.4 07/02/2016 1235     PFT    Component Value Date/Time   FEV1PRE 0.84 10/26/2016 0704   FEV1POST 1.03 10/26/2016 0704   FVCPRE 1.75 10/26/2016 0704   FVCPOST 2.47 10/26/2016 0704   TLC 6.19 08/16/2016 1435   DLCOUNC 14.10 08/16/2016 1435   PREFEV1FVCRT 48 10/26/2016 0704   PSTFEV1FVCRT 42 10/26/2016 0704    Past medical hx Past Medical History:  Diagnosis Date  . Cancer ([place]) 2010   invasive ductal breast cancer; on tamoxifen  . Complication of anesthesia    was in ICU after lung surgery 2012  . COPD (chronic obstructive pulmonary disease) ([place])   . Diverticulitis    required colcectomy  . Emphysema of lung ([place])   . Fibromyalgia 1995  . GERD (gastroesophageal reflux disease)   . Hearing loss of both ears  uses hearing aids  bilaterally  . Hx of colonic polyps    Dr. Cristina Gong -last study '11  . Hyperlipidemia   . Hypertension   . Hypothyroidism   . Osteoporosis   . Peripheral vascular disease [facility]) May '12 - CT angio   SMA chronic occlusion; stenosis of celiac trunk.  . Shortness of breath dyspnea   . Varicose veins of leg with swelling    varicose vein surgery - Dr. Aleda Grana  . Wears glasses      Social History  Substance Use Topics  . Smoking status: Former Smoker    Packs/day: 0.75    Years: 50.00    Types: Cigarettes    Quit date: 03/18/2016  . Smokeless tobacco: Never Used  . Alcohol use 6.0 oz/week    10 Cans of beer per week     Comment: occ    Tobacco Cessation: Patient  is a former smoker. She quit smoking in June 2017. Pack years smoking history equal 37.5 years.  Past surgical hx, Family hx, Social hx all reviewed.  Current Outpatient Prescriptions on File Prior to Visit  Medication Sig  . ADVAIR DISKUS 100-50 MCG/DOSE AEPB INHALE 1 PUFF INTO THE LUNGS 2 TIMES DAILY.  Marland Kitchen albuterol (PROVENTIL HFA;VENTOLIN HFA) 108 (90 Base) MCG/ACT inhaler Inhale 2 puffs into the lungs every 4 (four) hours as needed for wheezing. As a RESCUE medication  . amitriptyline (ELAVIL) 50 MG tablet TAKE 1 TABLET (50 MG TOTAL) BY MOUTH AT BEDTIME.  Marland Kitchen amLODipine (NORVASC) 5 MG tablet Take 1 tablet (5 mg total) by mouth daily.  Marland Kitchen aspirin 81 MG tablet Take 81 mg by mouth every evening.   Marland Kitchen atorvastatin (LIPITOR) 80 MG tablet TAKE 1 TABLET EVERY DAY. PLEASE KEEP UPCOMING APPT.THANK YOU  . bisacodyl (DULCOLAX) 5 MG EC tablet Take 1 tablet (5 mg total) by mouth daily as needed for moderate constipation. (Patient taking differently: Take 5 mg by mouth daily. )  . buPROPion (WELLBUTRIN SR) 150 MG 12 hr tablet Take 1 tablet (150 mg total) by mouth 2 (two) times daily.  . cilostazol (PLETAL) 100 MG tablet Take 1 tablet (100 mg total) by mouth 2 (two) times daily.  . diclofenac (VOLTAREN) 75 MG EC tablet TAKE ONE TABLET BY MOUTH TWICE DAILY  . fluticasone (FLONASE) 50 MCG/ACT nasal spray USE 2 SPRAYS IN BOTH NOSTRILS DAILY.  Marland Kitchen Fluticasone-Salmeterol (ADVAIR DISKUS) 100-50 MCG/DOSE AEPB Inhale 1 puff into the lungs 2 (two) times daily.  . Fluticasone-Umeclidin-Vilant (TRELEGY ELLIPTA) 100-62.5-25 MCG/INH AEPB Inhale 1 puff into the lungs daily.  Marland Kitchen guaiFENesin (MUCINEX) 600 MG 12 hr tablet Take 600 mg by mouth 2 (two) times daily.  . hydrOXYzine (ATARAX/VISTARIL) 10 MG tablet Take 10 mg by mouth 3 (three) times daily as needed.  Marland Kitchen ipratropium-albuterol (DUONEB) 0.5-2.5 (3) MG/3ML SOLN USE 1 NEB 3 TIMES DAILY FOR 3 DAYS, THEN EVERY 6 HOURS AS NEEDED.  Marland Kitchen levothyroxine (SYNTHROID, LEVOTHROID) 112 MCG  tablet TAKE ONE TABLET BY MOUTH ONE TIME DAILY  . LORazepam (ATIVAN) 2 MG tablet TAKE 1 TABLET BY MOUTH AT BEDTIME AS NEEDED FOR ANXIETY  . metoprolol tartrate (LOPRESSOR) 25 MG tablet TAKE 1 TABLET BY MOUTH 2 TIMES DAILY.  Marland Kitchen omeprazole (PRILOSEC) 40 MG capsule TAKE 1 CAPSULE (40 MG TOTAL) BY MOUTH 2 (TWO) TIMES DAILY.  Marland Kitchen Tiotropium Bromide Monohydrate (SPIRIVA RESPIMAT) 2.5 MCG/ACT AERS Inhale 2 puffs into the lungs daily.   No current facility-administered medications on file prior to visit.  Allergies  Allergen Reactions  . Sulfonamide Derivatives Swelling    Swelling in face    Review Of Systems:  Constitutional:   No  weight loss, night sweats,  Fevers, chills, fatigue, or  lassitude.  HEENT:   No headaches,  Difficulty swallowing,  Tooth/dental problems, or  Sore throat,                No sneezing, itching, ear ache, nasal congestion, post nasal drip,   CV:  No chest pain,  Orthopnea, PND, + swelling in lower extremities L>R, no anasarca, dizziness, palpitations, syncope.   GI  No heartburn, indigestion, abdominal pain, nausea, vomiting, diarrhea, change in bowel habits, loss of appetite, bloody stools.   Resp: + Baseline  shortness of breath with exertion or none  at rest.  No excess mucus, no productive cough,  No non-productive cough,  No coughing up of blood.  No change in color of mucus.  No wheezing.  No chest wall deformity  Skin: + rash to bilateral legs no  lesions.  GU: no dysuria, change in color of urine, no urgency or frequency.  No flank pain, no hematuria   MS:  No joint pain or swelling.  No decreased range of motion.  No back pain.  Psych:  No change in mood or affect. No depression or anxiety.  No memory loss.   Vital Signs BP 104/64 (BP Location: Left Arm, Cuff Size: Normal)   Pulse 89   Ht '5\' 6"'$  (1.676 m)   Wt 155 lb 6.4 oz (70.5 kg)   SpO2 91%   BMI 25.08 kg/m    Physical Exam:  General- No distress,  A&Ox3, pleasant ENT: No sinus  tenderness, TM clear, pale nasal mucosa, no oral exudate,no post nasal drip, no LAN Cardiac: S1, S2, regular rate and rhythm, no murmur Chest: No wheeze/ rales/ dullness; no accessory muscle use, no nasal flaring, no sternal retractions Abd.: Soft Non-tender, nondistended, Ext: No clubbing cyanosis, 1+ edema bilateral lower extremities left greater than right Neuro:  normal strength Skin: + rashes to bilateral lower extremities, otherwise warm and dry and intact Psych: normal mood and behavior   Assessment/Plan  Acute on chronic respiratory failure with hypoxia (HCC) Continue wearing oxygen at 2 L with exertion. Continue oxygen at bedtime.  Solitary pulmonary nodule Patient for follow-up CT screening in November 2018  Edema of right lower extremity Lower extremity Dopplers indicated a chronic DVT and right lower extremity. Results reported to Dr. Chase Caller has chosen not to treat as not acute DVT Patient is currently diligent with daily aspirin treatment Plan We will refer you to a vascular specialist for assessment of your chronic dvt. Continue taking daily ASA without fail  Consider full dose ASA. Follow up with cardiology to evaluate for worsening heart failure. For any sudden shortness of breath or chest pain go to the emergency room. Follow up with Dr. Chase Caller in 3 months Please contact office for sooner follow up if symptoms do not improve or worsen or seek emergency care      Magdalen Spatz, NP 03/07/2017  8:32 AM

## 2017-03-06 NOTE — Assessment & Plan Note (Signed)
Patient for follow-up CT screening in November 2018

## 2017-03-06 NOTE — Patient Instructions (Addendum)
It is nice to meet you today. We will refer you to a vascular specialist for assessment of your chronic dvt. We will request a smaller POC for use.  Please wear your oxygen with exertion with goal saturations of 88-92%. Follow up with cardiology. For any sudden shortness of breath or chest pain go to the emergency room. Continue current inhaler regimen Remember to rinse mouth after use Follow up with Dr. Chase Caller in 3 months Please contact office for sooner follow up if symptoms do not improve or worsen or seek emergency care

## 2017-03-06 NOTE — Assessment & Plan Note (Signed)
Continue wearing oxygen at 2 L with exertion. Continue oxygen at bedtime.

## 2017-03-07 ENCOUNTER — Other Ambulatory Visit: Payer: Self-pay | Admitting: Acute Care

## 2017-03-07 DIAGNOSIS — I825Z1 Chronic embolism and thrombosis of unspecified deep veins of right distal lower extremity: Secondary | ICD-10-CM

## 2017-03-07 NOTE — Addendum Note (Signed)
Addended by: Parke Poisson E on: 03/07/2017 09:12 AM   Modules accepted: Orders

## 2017-03-09 DIAGNOSIS — J449 Chronic obstructive pulmonary disease, unspecified: Secondary | ICD-10-CM | POA: Diagnosis not present

## 2017-03-12 DIAGNOSIS — R768 Other specified abnormal immunological findings in serum: Secondary | ICD-10-CM | POA: Diagnosis not present

## 2017-03-12 DIAGNOSIS — R21 Rash and other nonspecific skin eruption: Secondary | ICD-10-CM | POA: Diagnosis not present

## 2017-03-13 ENCOUNTER — Encounter: Payer: Self-pay | Admitting: Nurse Practitioner

## 2017-03-14 ENCOUNTER — Other Ambulatory Visit: Payer: Self-pay | Admitting: Internal Medicine

## 2017-03-16 ENCOUNTER — Emergency Department (HOSPITAL_BASED_OUTPATIENT_CLINIC_OR_DEPARTMENT_OTHER)
Admission: EM | Admit: 2017-03-16 | Discharge: 2017-03-16 | Disposition: A | Discharge status: Home / Self Care | Payer: Medicare Other | Source: Home / Self Care | Attending: Emergency Medicine | Admitting: Emergency Medicine

## 2017-03-16 ENCOUNTER — Emergency Department (HOSPITAL_COMMUNITY): Payer: Medicare Other

## 2017-03-16 ENCOUNTER — Emergency Department (HOSPITAL_COMMUNITY)
Admission: EM | Admit: 2017-03-16 | Discharge: 2017-03-16 | Disposition: A | Discharge status: Home / Self Care | Payer: Medicare Other | Attending: Emergency Medicine | Admitting: Emergency Medicine

## 2017-03-16 ENCOUNTER — Encounter (HOSPITAL_COMMUNITY): Payer: Self-pay

## 2017-03-16 DIAGNOSIS — Z87891 Personal history of nicotine dependence: Secondary | ICD-10-CM | POA: Insufficient documentation

## 2017-03-16 DIAGNOSIS — M79604 Pain in right leg: Secondary | ICD-10-CM | POA: Diagnosis not present

## 2017-03-16 DIAGNOSIS — M7989 Other specified soft tissue disorders: Secondary | ICD-10-CM | POA: Diagnosis not present

## 2017-03-16 DIAGNOSIS — I2510 Atherosclerotic heart disease of native coronary artery without angina pectoris: Secondary | ICD-10-CM | POA: Insufficient documentation

## 2017-03-16 DIAGNOSIS — Z853 Personal history of malignant neoplasm of breast: Secondary | ICD-10-CM | POA: Diagnosis not present

## 2017-03-16 DIAGNOSIS — J449 Chronic obstructive pulmonary disease, unspecified: Secondary | ICD-10-CM | POA: Insufficient documentation

## 2017-03-16 DIAGNOSIS — Z7982 Long term (current) use of aspirin: Secondary | ICD-10-CM | POA: Insufficient documentation

## 2017-03-16 DIAGNOSIS — E039 Hypothyroidism, unspecified: Secondary | ICD-10-CM | POA: Insufficient documentation

## 2017-03-16 DIAGNOSIS — M79661 Pain in right lower leg: Secondary | ICD-10-CM

## 2017-03-16 DIAGNOSIS — I119 Hypertensive heart disease without heart failure: Secondary | ICD-10-CM | POA: Insufficient documentation

## 2017-03-16 DIAGNOSIS — Z7951 Long term (current) use of inhaled steroids: Secondary | ICD-10-CM | POA: Diagnosis not present

## 2017-03-16 DIAGNOSIS — R2241 Localized swelling, mass and lump, right lower limb: Secondary | ICD-10-CM | POA: Insufficient documentation

## 2017-03-16 DIAGNOSIS — Z79899 Other long term (current) drug therapy: Secondary | ICD-10-CM | POA: Insufficient documentation

## 2017-03-16 LAB — BASIC METABOLIC PANEL
Anion gap: 6 (ref 5–15)
BUN: 12 mg/dL (ref 6–20)
CO2: 27 mmol/L (ref 22–32)
Calcium: 8.8 mg/dL — ABNORMAL LOW (ref 8.9–10.3)
Chloride: 106 mmol/L (ref 101–111)
Creatinine, Ser: 0.81 mg/dL (ref 0.44–1.00)
GFR calc Af Amer: >60 mL/min (ref >60)
GFR calc non Af Amer: >60 mL/min (ref >60)
Glucose, Bld: 91 mg/dL (ref 65–99)
Potassium: 4 mmol/L (ref 3.5–5.1)
Sodium: 139 mmol/L (ref 135–145)

## 2017-03-16 LAB — CBC WITH DIFFERENTIAL/PLATELET
Basophils Absolute: 0 10*3/uL (ref 0.0–0.1)
Basophils Relative: 0 %
Eosinophils Absolute: 3.2 10*3/uL — ABNORMAL HIGH (ref 0.0–0.7)
Eosinophils Relative: 29 %
HCT: 42.4 % (ref 36.0–46.0)
Hemoglobin: 14.3 g/dL (ref 12.0–15.0)
Lymphocytes Relative: 10 %
Lymphs Abs: 1.1 10*3/uL (ref 0.7–4.0)
MCH: 29.6 pg (ref 26.0–34.0)
MCHC: 33.7 g/dL (ref 30.0–36.0)
MCV: 87.8 fL (ref 78.0–100.0)
Monocytes Absolute: 0.8 10*3/uL (ref 0.1–1.0)
Monocytes Relative: 7 %
Neutro Abs: 5.9 10*3/uL (ref 1.7–7.7)
Neutrophils Relative %: 54 %
Platelets: 443 10*3/uL — ABNORMAL HIGH (ref 150–400)
RBC: 4.83 MIL/uL (ref 3.87–5.11)
RDW: 14.5 % (ref 11.5–15.5)
WBC: 11 10*3/uL — ABNORMAL HIGH (ref 4.0–10.5)

## 2017-03-16 LAB — C-REACTIVE PROTEIN: CRP: 6.1 mg/dL — ABNORMAL HIGH (ref <1.0)

## 2017-03-16 LAB — SEDIMENTATION RATE: Sed Rate: 20 mm/hr (ref 0–22)

## 2017-03-16 MED ORDER — IOPAMIDOL (ISOVUE-370) INJECTION 76%
INTRAVENOUS | Status: AC
  Administered 2017-03-16: 100 mL via INTRAVENOUS
  Filled 2017-03-16: qty 100

## 2017-03-16 MED ORDER — HYDROXYZINE HCL 25 MG PO TABS
25.0000 mg | ORAL_TABLET | Freq: Once | ORAL | Status: AC
  Administered 2017-03-16: 25 mg via ORAL
  Filled 2017-03-16: qty 1

## 2017-03-16 NOTE — Discharge Instructions (Signed)
As discussed, your evaluation today has been largely reassuring.  But, it is important that you monitor your condition carefully, and do not hesitate to return to the ED if you develop new, or concerning changes in your condition. ? ?Otherwise, please follow-up with your physician for appropriate ongoing care. ? ?

## 2017-03-16 NOTE — Progress Notes (Signed)
*  PRELIMINARY RESULTS* Vascular Ultrasound Right lower extremity venous duplex has been completed.  Preliminary findings: no evidence of DVT.  Previous study 02/27/17 demonstrated chronic thrombus, which is not visualized today. However, previous images are not available to compare.    Landry Mellow, RDMS, RVT  03/16/2017, 3:01 PM

## 2017-03-16 NOTE — ED Provider Notes (Signed)
6:40 PM Patient is awake and alert, watching television. We discussed the findings of the CT angiography study. No complete occlusion of the arterial supply to either lower extremity. We discussed options for symptom control, including elevating the leg, compression hose.  Lamisil discussed importance of following up with a vascular surgeon. Patient discharged in stable condition.   Carmin Muskrat, MD 03/16/17 [phone]

## 2017-03-16 NOTE — ED Provider Notes (Signed)
[facility] DEPT Provider Note   CSN: [id number] Arrival date & time: 03/16/17  1572     History   Chief Complaint Chief Complaint  Patient presents with  . DVT    HPI [name] is a 71 y.o. female.  71 year old female with extensive past medical history including breast cancer, peripheral vascular disease, COPD who presents with right leg swelling. 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 He determined that she did not need anticoagulation. She reports that over the past 3 days, the swelling and pain in her right lower leg has gotten worse. She denies any trauma. She denies any numbness, fevers, or recent illness. She has had ongoing problems with a full body rash for which she is following with a dermatologist and rheumatologist. She has noticed some weeping from her right lower leg.   The history is provided by the patient.    Past Medical History:  Diagnosis Date  . Cancer ([place]) 2010   invasive ductal breast cancer; on tamoxifen  . Complication of anesthesia    was in ICU after lung surgery 2012  . COPD (chronic obstructive pulmonary disease) ([place])   . Diverticulitis    required colcectomy  . Emphysema of lung ([place])   . Fibromyalgia 1995  . GERD (gastroesophageal reflux disease)   . Hearing loss of both ears    uses hearing aids  bilaterally  . Hx of colonic polyps    Dr. Cristina Gong -last study '11  . Hyperlipidemia   . Hypertension   . Hypothyroidism   . Osteoporosis   . Peripheral vascular disease [facility]) May '12 - CT angio   SMA chronic occlusion; stenosis of celiac trunk.  . Shortness of breath dyspnea   . Varicose veins of leg with swelling    varicose vein surgery - Dr. Aleda Grana  . Wears glasses     Patient Active Problem List   Diagnosis Date Noted  . Edema of right lower extremity 02/26/2017  . Positive ANA (antinuclear antibody) 02/08/2017  . Elevated IgE  level and eosinophilia 12/31/2016  . Oral thrush 11/28/2016  . Rash and nonspecific skin eruption 11/28/2016  . Allergic rhinitis 11/28/2016  . History of bacterial pneumonia 08/09/2016  . Routine general medical examination at a health care facility 08/12/2015  . Insomnia 03/04/2015  . Breast cancer, right breast ([place]) 02/17/2015  . CAD (coronary artery disease) 07/11/2011  . Tobacco abuse 03/27/2011  . Chronic mesenteric ischemia ([place]) 03/27/2011  . Solitary pulmonary nodule 03/27/2011  . PAD (peripheral artery disease) ([place]) 03/04/2011  . Arthropathy 11/09/2010  . Malignant neoplasm of female breast ([place]) 09/20/2009  . Hypothyroidism 09/20/2009  . Hyperlipidemia 09/20/2009  . Cancer ([place]) 10/15/2008  . Hearing loss 09/14/2008  . Essential hypertension 09/08/2007  . Stage 3 severe COPD by GOLD classification ([place]) 09/08/2007  . GERD 09/08/2007    Past Surgical History:  Procedure Laterality Date  . ABDOMINAL HYSTERECTOMY  1973   metorrhagia  . APPENDECTOMY    . BREAST SURGERY  2010   mastectomy with reconstructive surgery  . BREAST SURGERY  2010   rt lump x2  . BREAST SURGERY  2010   partial rt br reduction  . CARDIAC CATHETERIZATION  '11   no obstructive coronary disease  . COLECTOMY  2002   sigmoid  . COLONOSCOPY W/ POLYPECTOMY    . LUNG SURGERY  9/12   rt mid lung wedge  .  PARTIAL MASTECTOMY WITH NEEDLE LOCALIZATION Left 02/23/2013   Procedure:  LEFT PARTIAL MASTECTOMY WITH NEEDLE LOCALIZATION;  Surgeon: Adin Hector, MD;  Location: [facility];  Service: General;  Laterality: Left;  . [place]   right upper lobe of lung as part of VAT-nodule/benign    OB History    No data available       Home Medications    Prior to Admission medications   Medication Sig Start Date End Date Taking? Authorizing Provider  ADVAIR DISKUS 100-50 MCG/DOSE AEPB INHALE 1 PUFF INTO THE LUNGS 2 TIMES DAILY. 01/28/17  Yes Brand Males, MD    albuterol (PROVENTIL HFA;VENTOLIN HFA) 108 (90 Base) MCG/ACT inhaler Inhale 2 puffs into the lungs every 4 (four) hours as needed for wheezing. As a RESCUE medication 05/07/16  Yes Hosie Poisson, MD  amitriptyline (ELAVIL) 50 MG tablet TAKE 1 TABLET (50 MG TOTAL) BY MOUTH AT BEDTIME. 08/03/16  Yes Hoyt Koch, MD  amLODipine (NORVASC) 5 MG tablet Take 1 tablet (5 mg total) by mouth daily. 11/21/16 03/06/18 Yes Erick Colace, NP  aspirin 81 MG tablet Take 81 mg by mouth every evening.    Yes [provider]  atorvastatin (LIPITOR) 80 MG tablet TAKE 1 TABLET EVERY DAY. PLEASE KEEP UPCOMING APPT.THANK YOU 09/25/16  Yes [name], NP  bisacodyl (DULCOLAX) 5 MG EC tablet Take 1 tablet (5 mg total) by mouth daily as needed for moderate constipation. Patient taking differently: Take 5 mg by mouth daily.  08/10/14  Yes Hoyt Koch, MD  buPROPion [facility] SR) 150 MG 12 hr tablet Take 1 tablet (150 mg total) by mouth 2 (two) times daily. 08/06/16  Yes Hoyt Koch, MD  cilostazol (PLETAL) 100 MG tablet Take 1 tablet (100 mg total) by mouth 2 (two) times daily. Patient taking differently: Take 100 mg by mouth daily.  01/25/17  Yes Larey Dresser, MD  diclofenac (VOLTAREN) 75 MG EC tablet TAKE ONE TABLET BY MOUTH TWICE DAILY 12/13/16  Yes Hoyt Koch, MD  fluticasone (FLONASE) 50 MCG/ACT nasal spray USE 2 SPRAYS IN BOTH NOSTRILS DAILY. 09/17/16  Yes Hoyt Koch, MD  guaiFENesin (MUCINEX) 600 MG 12 hr tablet Take 600 mg by mouth 2 (two) times daily.   Yes [provider]  hydrOXYzine (ATARAX/VISTARIL) 10 MG tablet Take 10 mg by mouth 3 (three) times daily as needed.   Yes [provider]  ipratropium-albuterol (DUONEB) 0.5-2.5 (3) MG/3ML SOLN USE 1 NEB 3 TIMES DAILY FOR 3 DAYS, THEN EVERY 6 HOURS AS NEEDED. 02/07/17  Yes Brand Males, MD  levothyroxine (SYNTHROID, LEVOTHROID) 112 MCG tablet TAKE ONE TABLET BY MOUTH ONE TIME  DAILY 08/16/15  Yes Hoyt Koch, MD  LORazepam (ATIVAN) 2 MG tablet TAKE 1 TABLET BY MOUTH AT BEDTIME FOR ANXIETY 03/15/17  Yes Hoyt Koch, MD  metoprolol tartrate (LOPRESSOR) 25 MG tablet TAKE 1 TABLET BY MOUTH 2 TIMES DAILY. Patient taking differently: 1qdpm 08/22/16  Yes Hoyt Koch, MD  Fluticasone-Umeclidin-Vilant (TRELEGY ELLIPTA) 100-62.5-25 MCG/INH AEPB Inhale 1 puff into the lungs daily. 02/26/17   Brand Males, MD  omeprazole (PRILOSEC) 40 MG capsule TAKE 1 CAPSULE (40 MG TOTAL) BY MOUTH 2 (TWO) TIMES DAILY. 08/06/16   Hoyt Koch, MD    Family History Family History  Problem Relation Age of Onset  . Colon cancer Mother        colon  . COPD Mother  brown lung  . Hypertension Mother   . Diabetes Mother   . Emphysema Mother   . Coronary artery disease Father   . Heart attack Father   . Sudden death Father   . Heart disease Father   . Cancer Sister        fallopian tube  . Hypertension Sister   . Coronary artery disease Brother   . Throat cancer Sister   . Melanoma Sister   . Hypertension Sister   . Pancreatic cancer Sister   . Cancer Sister   . Heart attack Brother        early 27's  . Heart failure Sister     Social History Social History  Substance Use Topics  . Smoking status: Former Smoker    Packs/day: 0.75    Years: 50.00    Types: Cigarettes    Quit date: 03/18/2016  . Smokeless tobacco: Never Used  . Alcohol use 6.0 oz/week    10 Cans of beer per week     Comment: occ     Allergies   Sulfonamide derivatives   Review of Systems Review of Systems All other systems reviewed and are negative except that which was mentioned in HPI  Physical Exam Updated Vital Signs BP 115/67 (BP Location: Left Arm)   Pulse 91   Temp 97.8 F (36.6 C) (Oral)   Resp 20   SpO2 94%   Physical Exam  Constitutional: She is oriented to person, place, and time. She appears well-developed and well-nourished. No  distress.  HENT:  Head: Normocephalic and atraumatic.  Moist mucous membranes  Eyes: Conjunctivae are normal. Pupils are equal, round, and reactive to light.  Neck: Neck supple.  Cardiovascular: Normal rate, regular rhythm and normal heart sounds.   No murmur heard. Pulmonary/Chest: Effort normal and breath sounds normal.  Abdominal: Soft. Bowel sounds are normal. She exhibits no distension. There is no tenderness.  Musculoskeletal: She exhibits edema (2+ pitting R lower leg to above knee).  Unable to palpate R DP or PT pulse; 2+ L DP pulse  Neurological: She is alert and oriented to person, place, and time. No sensory deficit.  Fluent speech  Skin: Skin is warm and dry.  Diffuse scaly rash with multiple areas of excoriation and scabbing involving extremities, R face, trunk; serous fluid leaking from L lower leg scabs  Psychiatric: She has a normal mood and affect. Judgment normal.  Nursing note and vitals reviewed.    ED Treatments / Results  Labs (all labs ordered are listed, but only abnormal results are displayed) Labs Reviewed  BASIC METABOLIC PANEL - Abnormal; Notable for the following:       Result Value   Calcium 8.8 (*)    All other components within normal limits  CBC WITH DIFFERENTIAL/PLATELET - Abnormal; Notable for the following:    WBC 11.0 (*)    Platelets 443 (*)    Eosinophils Absolute 3.2 (*)    All other components within normal limits  SEDIMENTATION RATE  C-REACTIVE PROTEIN    EKG  EKG Interpretation None       Radiology No results found.  Procedures Procedures (including critical care time)  Medications Ordered in ED Medications  hydrOXYzine (ATARAX/VISTARIL) tablet 25 mg (25 mg Oral Given 03/16/17 1441)     Initial Impression / Assessment and Plan / ED Course  I have reviewed the triage vital signs and the nursing notes.  Pertinent labs & imaging results that were available during my care of  the patient were reviewed by me and considered  in my medical decision making (see chart for details).     PT w/ PVD and recent diagnosis of chronic DVT in R leg p/w worsening R lower leg swelling and pain. On exam, she has an extensive rash involving her entire body including the right lower leg but no evidence of acute cellulitis in this area. Pitting edema on right lower leg and I was unable to palpate DP pulse. DP pulse was present on doppler. L DP pulse palpable and strong. No cyanosis or significant pallor of the right foot compared to left. Obtained DVT US which was negative. Labwork unremarkable with WBC 11,000, normal ESR. My suspicion for infection is low but I am concerned about her pulse discrepancy on right foot versus left. She does have history of peripheral vascular disease in her abdomen and I have recommended CT angio of leg to evaluate for arterial stenosis/occlusion. Signing out to the oncoming provider who will follow-up on imaging. Patient's disposition is pending imaging results.  Final Clinical Impressions(s) / ED Diagnoses   Final diagnoses:  Pain and swelling of lower leg, right    New Prescriptions New Prescriptions   No medications on file     , Wenda Overland, MD 03/16/17 1614

## 2017-03-16 NOTE — ED Triage Notes (Signed)
Pt has dx clot in rt leg.  Pt has been on blood thinners x 4 years for cardiac reasons.  Told by MD to come if increased swelling.  Pt states swelling has increased and leg is weeping.

## 2017-03-21 DIAGNOSIS — R600 Localized edema: Secondary | ICD-10-CM | POA: Diagnosis not present

## 2017-03-21 DIAGNOSIS — M79604 Pain in right leg: Secondary | ICD-10-CM | POA: Diagnosis not present

## 2017-03-21 DIAGNOSIS — I8311 Varicose veins of right lower extremity with inflammation: Secondary | ICD-10-CM | POA: Diagnosis not present

## 2017-03-22 DIAGNOSIS — J449 Chronic obstructive pulmonary disease, unspecified: Secondary | ICD-10-CM | POA: Diagnosis not present

## 2017-03-25 DIAGNOSIS — L539 Erythematous condition, unspecified: Secondary | ICD-10-CM | POA: Diagnosis not present

## 2017-03-25 DIAGNOSIS — L03115 Cellulitis of right lower limb: Secondary | ICD-10-CM | POA: Diagnosis not present

## 2017-03-25 DIAGNOSIS — R21 Rash and other nonspecific skin eruption: Secondary | ICD-10-CM | POA: Diagnosis not present

## 2017-03-25 DIAGNOSIS — L299 Pruritus, unspecified: Secondary | ICD-10-CM | POA: Diagnosis not present

## 2017-04-02 DIAGNOSIS — L309 Dermatitis, unspecified: Secondary | ICD-10-CM | POA: Insufficient documentation

## 2017-04-02 DIAGNOSIS — L739 Follicular disorder, unspecified: Secondary | ICD-10-CM | POA: Diagnosis not present

## 2017-04-03 ENCOUNTER — Encounter: Payer: Self-pay | Admitting: Nurse Practitioner

## 2017-04-03 ENCOUNTER — Other Ambulatory Visit: Payer: Self-pay | Admitting: *Deleted

## 2017-04-03 ENCOUNTER — Ambulatory Visit (INDEPENDENT_AMBULATORY_CARE_PROVIDER_SITE_OTHER): Payer: Medicare Other | Admitting: Nurse Practitioner

## 2017-04-03 ENCOUNTER — Encounter (INDEPENDENT_AMBULATORY_CARE_PROVIDER_SITE_OTHER): Payer: Self-pay

## 2017-04-03 VITALS — BP 140/88 | HR 72 | Ht 66.0 in | Wt 151.8 lb

## 2017-04-03 DIAGNOSIS — R600 Localized edema: Secondary | ICD-10-CM | POA: Diagnosis not present

## 2017-04-03 DIAGNOSIS — I10 Essential (primary) hypertension: Secondary | ICD-10-CM

## 2017-04-03 DIAGNOSIS — I2510 Atherosclerotic heart disease of native coronary artery without angina pectoris: Secondary | ICD-10-CM | POA: Diagnosis not present

## 2017-04-03 DIAGNOSIS — K551 Chronic vascular disorders of intestine: Secondary | ICD-10-CM

## 2017-04-03 DIAGNOSIS — I739 Peripheral vascular disease, unspecified: Secondary | ICD-10-CM

## 2017-04-03 DIAGNOSIS — I5031 Acute diastolic (congestive) heart failure: Secondary | ICD-10-CM

## 2017-04-03 DIAGNOSIS — E7800 Pure hypercholesterolemia, unspecified: Secondary | ICD-10-CM

## 2017-04-03 NOTE — Progress Notes (Signed)
CARDIOLOGY OFFICE NOTE  Date:  04/03/2017    [name] Date of Birth: [birth year] Medical Record #[id number]  PCP:  Hoyt Koch, MD  Cardiologist:  Annabell Howells    Chief Complaint  Patient presents with  . Edema    Work in visit - seen for Dr. Aundra Dubin    History of Present Illness: [name] is a 71 y.o. female who presents today for a work in visit. Former patient of Dr. Claris Gladden. She will follow with me going forward.   She has a history of smoking, HTN, COPD, right breast cancer, hyperlipidemia, PAD, and mesenteric vascular disease.  Patient had been having nocturnal and post-prandial abdominal cramping. She had a mesenteric angiogram done in 5/12 by Dr. Bridgett Larsson, showing occluded celiac and SMA arteries with patent IMA. She was planned for aorto-mesenteric bypass. She was referred for cardiology evaluation prior to surgery. However, it was ultimately decided not to treat her mesenteric disease surgically.   Given her exertional chest tightness and shortness of breath as well as known vascular disease, Dr. Aundra Dubin set her up for left heart cath. This was done in 5/12 and showed a 70-80% ostial stenosis of a small to moderate 2nd diagonal. This was too small to intervene on and not likely to be particularly symptomatic. Lower extremity arterial dopplers showed a severe focal proximal right SFA stenosis with collaterals from the PFA. She had a VATS for a right-sided lung nodule that showed granulomatous inflammation (no cancer). She quit smoking. She saw Dr. Fletcher Anon for PAD evaluation, and it was decided to treat her medically. She tried cilostazol but did not see much difference with its use so stopped it.   Last seen here back in November by me - she was doing well. Was back on Pletal. Not smoking.   Sent back here today from pulmonary for evaluation of right leg swelling. 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 In the ER earlier this month - had more swelling in the right leg - CT done. Repeat duplex on the right leg was negative.   Comes back today. Here alone.     PMH: 1. Hypothyroidism 2. HTN 3. GERD 4. Hypothyroidism 5. OSA 6. Venous insufficiency 7. 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 The IMA was patent. CTA abdomen (6/14) showed occluded celiac and SMA with distal reconstitution, no changes to suggest bowel infarction or ischemia.  8. Hyperlipidemia 9. COPD: PFTs (6/12) with moderate obstructive airways disease and response to bronchodilator.  10. Hyperlipidemia 11. Fibromyalgia 12. Breast cancer s/p mastectomy and chemotherapy in 2010.  13. CAD: LHC (5/12) with 70-80% ostial stenosis of a small-moderate 2nd diagonal. This was too small to intervene upon and unlikely to cause significant symptoms.  14. PAD: Peripheral arterial dopplers (5/12) with severe focal proximal right SFA stenosis. There were collaterals from the PFA. Patient has claudication symptoms. Peripheral arterial dopplers in 5/13 were stable compared to 5/12. Peripheral arterial dopplers (8/14) with ABIs 0.71 on right, 1.1 on left (known right SFA occlusion).  15. 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 VATS 8/12 for right upper lobe nodule showed granulomatous material.   Comes in today. Here alone. Little hard of hearing - forgot her hearing aids. Has not done  well since February. Started having pain and right swelling of the right leg. She has had such swelling that she has had blisters. She has had a generalized rash over her body. She has seen dermatology - she has been treated with steroids and antibiotics. She apparently had a + ANA - saw Rheumatology but was not felt to have Lupus. Noted the +DVT from May  - this was not seen on the duplex from earlier this month. She still has pain in her legs. Her swelling is improved but not resolved. No chest pain. Breathing remains short but she feels like it is unchanged. She was told at her visit earlier this month to the ER to see vascular surgery - this has not been arranged.   Past Medical History:  Diagnosis Date  . Cancer ([place]) 2010   invasive ductal breast cancer; on tamoxifen  . Complication of anesthesia    was in ICU after lung surgery 2012  . COPD (chronic obstructive pulmonary disease) ([place])   . Diverticulitis    required colcectomy  . Emphysema of lung ([place])   . Fibromyalgia 1995  . GERD (gastroesophageal reflux disease)   . Hearing loss of both ears    uses hearing aids  bilaterally  . Hx of colonic polyps    Dr. Cristina Gong -last study '11  . Hyperlipidemia   . Hypertension   . Hypothyroidism   . Osteoporosis   . Peripheral vascular disease [facility]) May '12 - CT angio   SMA chronic occlusion; stenosis of celiac trunk.  . Shortness of breath dyspnea   . Varicose veins of leg with swelling    varicose vein surgery - Dr. Aleda Grana  . Wears glasses     Past Surgical History:  Procedure Laterality Date  . ABDOMINAL HYSTERECTOMY  1973   metorrhagia  . APPENDECTOMY    . BREAST SURGERY  2010   mastectomy with reconstructive surgery  . BREAST SURGERY  2010   rt lump x2  . BREAST SURGERY  2010   partial rt br reduction  . CARDIAC CATHETERIZATION  '11   no obstructive coronary disease  . COLECTOMY  2002   sigmoid  . COLONOSCOPY W/ POLYPECTOMY    . LUNG SURGERY  9/12   rt mid lung wedge  . PARTIAL MASTECTOMY WITH NEEDLE LOCALIZATION Left 02/23/2013   Procedure:  LEFT PARTIAL MASTECTOMY WITH NEEDLE LOCALIZATION;  Surgeon: Adin Hector, MD;  Location: [facility];  Service: General;  Laterality: Left;  . [place]   right upper lobe of lung as part of VAT-nodule/benign     Medications: Current  Outpatient Prescriptions  Medication Sig Dispense Refill  . ADVAIR DISKUS 100-50 MCG/DOSE AEPB INHALE 1 PUFF INTO THE LUNGS 2 TIMES DAILY. 180 each 1  . albuterol (PROVENTIL HFA;VENTOLIN HFA) 108 (90 Base) MCG/ACT inhaler Inhale 2 puffs into the lungs every 4 (four) hours as needed for wheezing. As a RESCUE medication 1 Inhaler 1  . amitriptyline (ELAVIL) 50 MG tablet TAKE 1 TABLET (50 MG TOTAL) BY MOUTH AT BEDTIME. 90 tablet 3  . amLODipine (NORVASC) 5 MG tablet Take 1 tablet (5 mg total) by mouth daily. 90 tablet 1  . aspirin 81 MG tablet Take 81 mg by mouth every evening.     Marland Kitchen atorvastatin (LIPITOR) 80 MG tablet TAKE 1 TABLET EVERY DAY. PLEASE KEEP UPCOMING APPT.THANK YOU 90 tablet 3  . bisacodyl (DULCOLAX) 5 MG EC tablet Take 1 tablet (5 mg total) by  mouth daily as needed for moderate constipation. (Patient taking differently: Take 5 mg by mouth daily. ) 30 tablet 11  . buPROPion (WELLBUTRIN SR) 150 MG 12 hr tablet Take 1 tablet (150 mg total) by mouth 2 (two) times daily. 180 tablet 1  . cilostazol (PLETAL) 100 MG tablet Take 1 tablet (100 mg total) by mouth 2 (two) times daily. (Patient taking differently: Take 100 mg by mouth daily. ) 180 tablet 1  . diclofenac (VOLTAREN) 75 MG EC tablet TAKE ONE TABLET BY MOUTH TWICE DAILY 60 tablet 7  . fluticasone (FLONASE) 50 MCG/ACT nasal spray USE 2 SPRAYS IN BOTH NOSTRILS DAILY. 48 g 3  . Fluticasone-Umeclidin-Vilant (TRELEGY ELLIPTA) 100-62.5-25 MCG/INH AEPB Inhale 1 puff into the lungs daily. 2 each 0  . guaiFENesin (MUCINEX) 600 MG 12 hr tablet Take 600 mg by mouth 2 (two) times daily.    . hydrOXYzine (ATARAX/VISTARIL) 10 MG tablet Take 10 mg by mouth 3 (three) times daily as needed.    Marland Kitchen ipratropium-albuterol (DUONEB) 0.5-2.5 (3) MG/3ML SOLN USE 1 NEB 3 TIMES DAILY FOR 3 DAYS, THEN EVERY 6 HOURS AS NEEDED. 360 mL 0  . levothyroxine (SYNTHROID, LEVOTHROID) 112 MCG tablet TAKE ONE TABLET BY MOUTH ONE TIME DAILY 90 tablet 3  . LORazepam (ATIVAN) 2  MG tablet TAKE 1 TABLET BY MOUTH AT BEDTIME FOR ANXIETY 30 tablet 2  . metoprolol tartrate (LOPRESSOR) 25 MG tablet TAKE 1 TABLET BY MOUTH 2 TIMES DAILY. (Patient taking differently: 1qdpm) 180 tablet 3  . omeprazole (PRILOSEC) 40 MG capsule TAKE 1 CAPSULE (40 MG TOTAL) BY MOUTH 2 (TWO) TIMES DAILY. 180 capsule 3  . OXYGEN Inhale 2 L into the lungs at bedtime.     No current facility-administered medications for this visit.     Allergies: Allergies  Allergen Reactions  . Sulfonamide Derivatives Swelling    Swelling in face    Social History: The patient  reports that she quit smoking about 12 months ago. Her smoking use included Cigarettes. She has a 37.50 pack-year smoking history. She has never used smokeless tobacco. She reports that she drinks about 6.0 oz of alcohol per week . She reports that she does not use drugs.   Family History: The patient's family history includes COPD in her mother; Cancer in her sister and sister; Colon cancer in her mother; Coronary artery disease in her brother and father; Diabetes in her mother; Emphysema in her mother; Heart attack in her brother and father; Heart disease in her father; Heart failure in her sister; Hypertension in her mother, sister, and sister; Melanoma in her sister; Pancreatic cancer in her sister; Sudden death in her father; Throat cancer in her sister.   Review of Systems: Please see the history of present illness.   Otherwise, the review of systems is positive for none.   All other systems are reviewed and negative.   Physical Exam: VS:  BP 140/88 (BP Location: Left Arm, Patient Position: Sitting, Cuff Size: Normal)   Pulse 72   Ht 5\' 6"  (1.676 m)   Wt 151 lb 12.8 oz (68.9 kg)   SpO2 92% Comment: at rest  BMI 24.50 kg/m  .  BMI Body mass index is 24.5 kg/m.  Wt Readings from Last 3 Encounters:  04/03/17 151 lb 12.8 oz (68.9 kg)  03/06/17 155 lb 6.4 oz (70.5 kg)  02/26/17 157 lb 3.2 oz (71.3 kg)    General: Pleasant.  She is hard of hearing. She is alert and in  no acute distress.  Her weight is actually down.  HEENT: Normal.  Neck: Supple, no JVD, carotid bruits, or masses noted.  Cardiac: Regular rate and rhythm. No murmurs, rubs, or gallops. 2+ edema on the right.  Respiratory:  Lungs are clear to auscultation bilaterally with normal work of breathing.  GI: Soft and nontender.  MS: No deformity or atrophy. Gait and ROM intact.  Skin: Warm and dry. She has a diffuse rash/multiple blisters over her body in various states of healing - she has a dime size open wound behind her knee. Her legs are warm to touch.   Neuro:  Strength and sensation are intact and no gross focal deficits noted.  Psych: Alert, appropriate and with normal affect.       LABORATORY DATA:  EKG:  EKG is not ordered today.   Lab Results  Component Value Date   WBC 11.0 (H) 03/16/2017   HGB 14.3 03/16/2017   HCT 42.4 03/16/2017   PLT 443 (H) 03/16/2017   GLUCOSE 91 03/16/2017   CHOL 174 09/03/2016   TRIG 247 (H) 09/03/2016   HDL 73 09/03/2016   LDLDIRECT 88.0 08/18/2013   LDLCALC 52 09/03/2016   ALT 26 11/17/2016   AST 37 11/17/2016   NA 139 03/16/2017   K 4.0 03/16/2017   CL 106 03/16/2017   CREATININE 0.81 03/16/2017   BUN 12 03/16/2017   CO2 27 03/16/2017   TSH 1.83 08/12/2015   INR 0.98 03/23/2013     BNP (last 3 results)  Recent Labs  07/02/16 1235  BNP 28.4    ProBNP (last 3 results) No results for input(s): PROBNP in the last 8760 hours.   Other Studies Reviewed Today:  CT ANGIO BIFEM IMPRESSION 03/2017: VASCULAR  Moderate atherosclerotic disease throughout the aorta and common iliac vessels.  Near complete occlusion of the celiac artery and complete occlusion of the proximal SMA. Reconstitution of the SMA through IMA.  Moderate disease throughout the right superficial femoral artery and trifurcation vessels in the right calf. Mild disease within the left posterior tibial artery and  proximal anterior tibial artery, otherwise patent left lower extremity runoff.  No visible deep venous thrombosis.  NON-VASCULAR  Moderate-sized hiatal hernia.  No acute findings in the abdomen or pelvis.  Subcutaneous edema/swelling in the right lower extremity from the mid thigh through the calf.   Electronically Signed   By: Rolm Baptise M.D.   On: 03/16/2017 18:22  Summary:  - No evidence of deep vein thrombosis involving the right lower   extremity. - No evidence of Baker&'s cyst on the right.  Other specific details can be found in the table(s) above. Prepared and Electronically Authenticated by  Jessy Oto. Fields MD 2018-06-03T12:09:54   02/27/2017: Vascular US Lower Extremities Evidence of right lower extremity chronic, non occlusive, deep venous thrombus in one of the paired peroneal veins with incompetency.  No evidence of left lower extremity deep or superficial venous thrombus or incompetence.  Echo Study Conclusions 06/2016  - Left ventricle: The cavity size was normal. Systolic function was normal. The estimated ejection fraction was in the range of 55% to 60%. Doppler parameters are consistent with abnormal left ventricular relaxation (grade 1 diastolic dysfunction). - Mitral valve: Calcified annulus. Mildly thickened leaflets .   Assessment/Plan:  1. Right leg swelling - had peroneal DVT noted in May - no DVT on scan from earlier this month - recent CT scan noted as well - also with dime sized non healing ulcer.  Needs referral to vascular surgery.   2. PAD: recent scan noted - would favor getting back to [facility] for evaluation. She does not wish to see Dr. Bridgett Larsson per patient request.   3. Mesenteric ischemia:  She has been managed medically over the past several years.    4. CAD: Nonobstructive CAD. No active chest pain. Continue ASA, statin.   5. COPD: followed by pulmonary. She has stopped smoking. She is on oxygen at night.       6. HTN: BP is fair.   7. Prior history of diastolic dysfunction. I suspect her swelling is more vascular related - but will get her echo updated. For now, no change in medicines. I think she needs to get to vascular surgery.   8. Diffuse rash - ? Vasculitis - apparently does not have Lupus - has seen Dr. Elpidio Galea with rheumatology and does not have to follow back up there.   Current medicines are reviewed with the patient today.  The patient does not have concerns regarding medicines other than what has been noted above.  The following changes have been made:  See above.  Labs/ tests ordered today include:    Orders Placed This Encounter  Procedures  . Ambulatory referral to Vascular Surgery  . ECHOCARDIOGRAM COMPLETE     Disposition:   FU with me as planned unless the echo is abnormal. Referral placed to [facility]VS.   Patient is agreeable to this plan and will call if any problems develop in the interim.   SignedTruitt Merle, NP  04/03/2017 2:09 PM  [facility] [address] [place], [place]  [address] Phone: [phone] Fax: [phone]

## 2017-04-03 NOTE — Patient Instructions (Addendum)
We will be checking the following labs today - NONE   Medication Instructions:    Continue with your current medicines.     Testing/Procedures To Be Arranged:  Echocardiogram  Follow-Up:   See me back as planned.     Other Special Instructions:   I am referring you to Vascular Surgery    If you need a refill on your cardiac medications before your next appointment, please call your pharmacy.   Call the [facility] office at [phone] if you have any questions, problems or concerns.

## 2017-04-04 ENCOUNTER — Ambulatory Visit (HOSPITAL_COMMUNITY)
Admission: RE | Admit: 2017-04-04 | Discharge: 2017-04-04 | Disposition: A | Discharge status: Home / Self Care | Payer: Medicare Other | Source: Ambulatory Visit | Attending: Vascular Surgery | Admitting: Vascular Surgery

## 2017-04-04 DIAGNOSIS — R9439 Abnormal result of other cardiovascular function study: Secondary | ICD-10-CM | POA: Insufficient documentation

## 2017-04-04 DIAGNOSIS — I739 Peripheral vascular disease, unspecified: Secondary | ICD-10-CM | POA: Diagnosis not present

## 2017-04-05 ENCOUNTER — Ambulatory Visit (INDEPENDENT_AMBULATORY_CARE_PROVIDER_SITE_OTHER): Payer: Medicare Other | Admitting: Vascular Surgery

## 2017-04-05 ENCOUNTER — Encounter: Payer: Self-pay | Admitting: Vascular Surgery

## 2017-04-05 VITALS — BP 119/71 | HR 74 | Temp 97.8°F | Resp 16 | Ht 66.0 in | Wt 149.0 lb

## 2017-04-05 DIAGNOSIS — I70231 Atherosclerosis of native arteries of right leg with ulceration of thigh: Secondary | ICD-10-CM | POA: Diagnosis not present

## 2017-04-05 NOTE — Progress Notes (Signed)
Patient ID: [name], female   DOB: 21-Feb-1946, 71 y.o.   MRN: [id number]  Reason for Consult: New Evaluation (wound behind right knee)   Referred by [name], NP  Subjective:     HPI:  [name] is a 71 y.o. female with a history of mesenteric ischemia status post attempted revascularization in the past. She also has COPD and is followed now by [name] for cardiac disease. She developed a skin condition which has not been diagnosed and has not respond to steroids. She's been seen by both dermatology and rheumatology without any diagnosis. About 3 weeks ago she developed a wound posterior to her right knee which is very punctate and initially was associated with some drainage although not now. She does not have any erythema and has not had fevers around this. It is causing minimal discomfort but no pain. She has not really tried any therapy zone and is protecting the wound at this time. She does not note any trauma. She states that previously she had associated leg swelling and that her skin condition was much worse although it is improved recently. She does take Pletal for history of claudication and has supposedly known SFA stenosis from previous studies although I cannot find that today. She is not having any other, occasions related to today's visit. The lotion resembling she does not have any rest pain and can walk approximately quarter mile prior to stopping. She is mostly limited in walking by her COPD.  Past Medical History:  Diagnosis Date  . Cancer ([place]) 2010   invasive ductal breast cancer; on tamoxifen  . Complication of anesthesia    was in ICU after lung surgery 2012  . COPD (chronic obstructive pulmonary disease) ([place])   . Diverticulitis    required colcectomy  . Emphysema of lung ([place])   . Fibromyalgia 1995  . GERD (gastroesophageal reflux disease)   . Hearing loss of both ears    uses hearing aids  bilaterally  . Hx of colonic polyps    Dr.  Cristina Gong -last study '11  . Hyperlipidemia   . Hypertension   . Hypothyroidism   . Osteoporosis   . Peripheral vascular disease [facility]) May '12 - CT angio   SMA chronic occlusion; stenosis of celiac trunk.  . Shortness of breath dyspnea   . Varicose veins of leg with swelling    varicose vein surgery - Dr. Aleda Grana  . Wears glasses    Family History  Problem Relation Age of Onset  . Colon cancer Mother        colon  . COPD Mother        brown lung  . Hypertension Mother   . Diabetes Mother   . Emphysema Mother   . Coronary artery disease Father   . Heart attack Father   . Sudden death Father   . Heart disease Father   . Cancer Sister        fallopian tube  . Hypertension Sister   . Coronary artery disease Brother   . Throat cancer Sister   . Melanoma Sister   . Hypertension Sister   . Pancreatic cancer Sister   . Cancer Sister   . Heart attack Brother        early 23's  . Heart failure Sister    Past Surgical History:  Procedure Laterality Date  . ABDOMINAL HYSTERECTOMY  1973   metorrhagia  . APPENDECTOMY    . BREAST SURGERY  2010   mastectomy with reconstructive surgery  . BREAST SURGERY  2010   rt lump x2  . BREAST SURGERY  2010   partial rt br reduction  . CARDIAC CATHETERIZATION  '11   no obstructive coronary disease  . COLECTOMY  2002   sigmoid  . COLONOSCOPY W/ POLYPECTOMY    . LUNG SURGERY  9/12   rt mid lung wedge  . PARTIAL MASTECTOMY WITH NEEDLE LOCALIZATION Left 02/23/2013   Procedure:  LEFT PARTIAL MASTECTOMY WITH NEEDLE LOCALIZATION;  Surgeon: Adin Hector, MD;  Location: [facility];  Service: General;  Laterality: Left;  . [place]   right upper lobe of lung as part of VAT-nodule/benign    Short Social History:  Social History  Substance Use Topics  . Smoking status: Former Smoker    Packs/day: 0.75    Years: 50.00    Types: Cigarettes    Quit date: 03/18/2016  . Smokeless tobacco: Never Used  . Alcohol  use 6.0 oz/week    10 Cans of beer per week     Comment: occ    Allergies  Allergen Reactions  . Sulfonamide Derivatives Swelling    Swelling in face    Current Outpatient Prescriptions  Medication Sig Dispense Refill  . ADVAIR DISKUS 100-50 MCG/DOSE AEPB INHALE 1 PUFF INTO THE LUNGS 2 TIMES DAILY. 180 each 1  . albuterol (PROVENTIL HFA;VENTOLIN HFA) 108 (90 Base) MCG/ACT inhaler Inhale 2 puffs into the lungs every 4 (four) hours as needed for wheezing. As a RESCUE medication 1 Inhaler 1  . amitriptyline (ELAVIL) 50 MG tablet TAKE 1 TABLET (50 MG TOTAL) BY MOUTH AT BEDTIME. 90 tablet 3  . amLODipine (NORVASC) 5 MG tablet Take 1 tablet (5 mg total) by mouth daily. 90 tablet 1  . aspirin 81 MG tablet Take 81 mg by mouth every evening.     Marland Kitchen atorvastatin (LIPITOR) 80 MG tablet TAKE 1 TABLET EVERY DAY. PLEASE KEEP UPCOMING APPT.THANK YOU 90 tablet 3  . bisacodyl (DULCOLAX) 5 MG EC tablet Take 1 tablet (5 mg total) by mouth daily as needed for moderate constipation. (Patient taking differently: Take 5 mg by mouth daily. ) 30 tablet 11  . buPROPion (WELLBUTRIN SR) 150 MG 12 hr tablet Take 1 tablet (150 mg total) by mouth 2 (two) times daily. 180 tablet 1  . cilostazol (PLETAL) 100 MG tablet Take 1 tablet (100 mg total) by mouth 2 (two) times daily. (Patient taking differently: Take 100 mg by mouth daily. ) 180 tablet 1  . diclofenac (VOLTAREN) 75 MG EC tablet TAKE ONE TABLET BY MOUTH TWICE DAILY 60 tablet 7  . fluticasone (FLONASE) 50 MCG/ACT nasal spray USE 2 SPRAYS IN BOTH NOSTRILS DAILY. 48 g 3  . Fluticasone-Umeclidin-Vilant (TRELEGY ELLIPTA) 100-62.5-25 MCG/INH AEPB Inhale 1 puff into the lungs daily. 2 each 0  . guaiFENesin (MUCINEX) 600 MG 12 hr tablet Take 600 mg by mouth 2 (two) times daily.    . hydrOXYzine (ATARAX/VISTARIL) 10 MG tablet Take 10 mg by mouth 3 (three) times daily as needed.    Marland Kitchen ipratropium-albuterol (DUONEB) 0.5-2.5 (3) MG/3ML SOLN USE 1 NEB 3 TIMES DAILY FOR 3 DAYS,  THEN EVERY 6 HOURS AS NEEDED. 360 mL 0  . levothyroxine (SYNTHROID, LEVOTHROID) 112 MCG tablet TAKE ONE TABLET BY MOUTH ONE TIME DAILY 90 tablet 3  . LORazepam (ATIVAN) 2 MG tablet TAKE 1 TABLET BY MOUTH AT BEDTIME FOR ANXIETY 30 tablet 2  . metoprolol  tartrate (LOPRESSOR) 25 MG tablet TAKE 1 TABLET BY MOUTH 2 TIMES DAILY. (Patient taking differently: 1qdpm) 180 tablet 3  . omeprazole (PRILOSEC) 40 MG capsule TAKE 1 CAPSULE (40 MG TOTAL) BY MOUTH 2 (TWO) TIMES DAILY. 180 capsule 3  . OXYGEN Inhale 2 L into the lungs at bedtime.     No current facility-administered medications for this visit.     Review of Systems  Constitutional: Positive for chills, fatigue and fever.  Eyes: Eyes negative.  Respiratory: Positive for cough, shortness of breath and wheezing.  Cardiovascular: Positive for chest tightness and leg swelling.  GI: Gastrointestinal negative.  Musculoskeletal: Musculoskeletal negative.  Skin: Positive for rash and wound.  Neurological: Positive for focal weakness.  Hematologic: Hematologic/lymphatic negative.  Psychiatric: Psychiatric negative.        Objective:  Objective   Vitals:   04/05/17 1040  BP: 119/71  Pulse: 74  Resp: 16  Temp: 97.8 F (36.6 C)  TempSrc: Oral  SpO2: 95%  Weight: 149 lb (67.6 kg)  Height: 5\' 6"  (1.676 m)   Body mass index is 24.05 kg/m.  Physical Exam  Constitutional: She is oriented to person, place, and time. She appears well-developed.  HENT:  Head: Normocephalic.  Eyes: Pupils are equal, round, and reactive to light.  Neck: Normal range of motion.  Cardiovascular: Normal rate.   Pulses:      Femoral pulses are 2+ on the right side, and 2+ on the left side.      Popliteal pulses are 2+ on the left side.       Dorsalis pedis pulses are 2+ on the left side.  Monophasic dp/pt on right  Pulmonary/Chest:  Increased work of breathing  Abdominal: Soft. She exhibits no mass.  Musculoskeletal: She exhibits no edema or deformity.    Neurological: She is alert and oriented to person, place, and time.  Skin:  Unspecified rashes of bue/ble 0.5 cm circular are of ulceration on posterior right knee  Psychiatric: She has a normal mood and affect. Her behavior is normal. Judgment and thought content normal.    Data: I have independently interpreter ABIs on the right to be 0.92 and left 1.14.     Assessment/Plan:     71 year old female with a three-week history of ulceration posterior to her right knee with an unknown skin condition over the past several months. Her ABI on the right is 0.9 to however her signals are monophasic at the level of the ankle. Given this I have offered her angiogram with possible intervention on the right although I'm unsure how much this will help her wound heal. We have also offered the alternative of following up in a few weeks to evaluate depression of the wound. If the wound gets worse she can call set up aortogram with right lower extremity runoff possible intervention. If the wound heals she can certainly cancel her follow-up appointment. We will otherwise see her in a few weeks for further evaluation and possible setting up procedure.      Waynetta Sandy MD [facility]

## 2017-04-15 ENCOUNTER — Other Ambulatory Visit (HOSPITAL_COMMUNITY): Payer: Medicare Other

## 2017-04-21 DIAGNOSIS — J449 Chronic obstructive pulmonary disease, unspecified: Secondary | ICD-10-CM | POA: Diagnosis not present

## 2017-04-26 ENCOUNTER — Ambulatory Visit: Payer: Medicare Other | Admitting: Vascular Surgery

## 2017-04-30 ENCOUNTER — Other Ambulatory Visit: Payer: Self-pay

## 2017-04-30 DIAGNOSIS — C50911 Malignant neoplasm of unspecified site of right female breast: Secondary | ICD-10-CM

## 2017-04-30 DIAGNOSIS — C50919 Malignant neoplasm of unspecified site of unspecified female breast: Secondary | ICD-10-CM

## 2017-05-01 DIAGNOSIS — Z1231 Encounter for screening mammogram for malignant neoplasm of breast: Secondary | ICD-10-CM | POA: Diagnosis not present

## 2017-05-01 DIAGNOSIS — Z853 Personal history of malignant neoplasm of breast: Secondary | ICD-10-CM | POA: Diagnosis not present

## 2017-05-03 ENCOUNTER — Ambulatory Visit: Payer: Medicare Other | Admitting: Vascular Surgery

## 2017-05-03 ENCOUNTER — Telehealth: Payer: Self-pay | Admitting: Oncology

## 2017-05-03 NOTE — Telephone Encounter (Signed)
sw pt to confirm 8/22 appt at 0930 per sch msg

## 2017-05-22 DIAGNOSIS — J449 Chronic obstructive pulmonary disease, unspecified: Secondary | ICD-10-CM | POA: Diagnosis not present

## 2017-06-04 ENCOUNTER — Ambulatory Visit (INDEPENDENT_AMBULATORY_CARE_PROVIDER_SITE_OTHER): Payer: Medicare Other | Admitting: Internal Medicine

## 2017-06-04 ENCOUNTER — Encounter: Payer: Self-pay | Admitting: Internal Medicine

## 2017-06-04 VITALS — BP 112/64 | HR 69 | Ht 66.0 in | Wt 152.0 lb

## 2017-06-04 DIAGNOSIS — J441 Chronic obstructive pulmonary disease with (acute) exacerbation: Secondary | ICD-10-CM | POA: Diagnosis not present

## 2017-06-04 DIAGNOSIS — J449 Chronic obstructive pulmonary disease, unspecified: Secondary | ICD-10-CM

## 2017-06-04 MED ORDER — CEPHALEXIN 500 MG PO CAPS: 500.0000 mg | ORAL_CAPSULE | Freq: Three times a day (TID) | ORAL | 0 refills | Status: DC

## 2017-06-04 MED ORDER — PREDNISONE 10 MG PO TABS: ORAL_TABLET | ORAL | 0 refills | Status: DC

## 2017-06-04 NOTE — Patient Instructions (Signed)
ICD-10-CM   1. COPD exacerbation ([place]) J44.1   2. Stage 3 severe COPD by GOLD classification ([place]) J44.9     Please take prednisone 40 mg x1 day, then 30 mg x1 day, then 20 mg x1 day, then 10 mg x1 day, and then 5 mg x1 day and stop  STart cephalexin 500mg  three times daily x  5 days  Continue spiriva and symbicort daily scheduled as before  Take product insert on daliresp to see if this is something that we could consider to prevent copd flare ups  Use albuterol as needed  Flu shot in fall   Please talk to PCP Hoyt Koch, MD -  and ensure you discuss  shingarix vaccine   Followoup 3 months or sooner if needed

## 2017-06-04 NOTE — Progress Notes (Signed)
Subjective:     Patient ID: OCTOBER PEERY, female   DOB: [birth year], 71 y.o.   MRN: [id number]  HPI  PCP Hoyt Koch, MD  OV 12/31/2016  Chief Complaint  Patient presents with  . Follow-up    Pt states her breahting is doing well. Pt denies cough and CP/tightness. Pt c/o acid reflux.      Follow-up Gold stage III COPD  She has recurrent exacerbation. Most recently was over a month ago when she was hospitalized. She currently feels baseline. She feels good. She is not on oxygen therapy. Reviewed her medication list and shows that she is only on Advair as control therapy for COPD. She has no idea why she is not on an anticholinergic. DuoNeb was listed but she says she only takes it sparingly as needed and since her discharge is not needing it. She has normal work and anticholinergic is. She also tells me that because of her eosinophilia and elevated IgE and seasonal allergies wants a referral to allergy clinic. She is seeing Dr. Allyson Sabal for her skin issues and she is feeling better.         Results for CHARESE, ABUNDIS (MRN [id number]) as of 12/31/2016 12:33  Ref. Range 07/04/2016 05:26 07/05/2016 03:23 07/10/2016 05:13 11/16/2016 13:20 11/17/2016 05:57  Eosinophils Absolute Latest Ref Range: 0.0 - 0.7 K/uL    1.6 (H) 0.0    Results for HADAS, JESSOP (MRN [id number]) as of 12/31/2016 12:33  Ref. Range 11/16/2016 13:20  IgE (Immunoglobulin E), Serum Latest Ref Range: 0 - 100 IU/mL 180 (H)    OV 02/26/2017  Chief Complaint  Patient presents with  . Follow-up    FOLLOW UP FOR patient states that she is here for SOB and her voice is horse and she has wheezing all the time patient denies c/p or tightness     Follow-up Gold stage III COPD: She is on triple inhaler therapy with Spiriva and Advair. Has COPD stable. She has elevated IgE and eosinophilia. She has seen allergist and then got referred to Dr. Lucia Gaskins ENT who apparently treated her for pharyngeal or laryngeal thrush and this has helped.  She also has skin rash. I referred her to Dr. Allyson Sabal and she's tried some prednisone course and has had skin biopsy with these things have not helped. Nevertheless she feels she is under good care. Overall in terms of COPD she stable but she does have significant cough and is a history of frequent exacerbations. But she does not think she is an exacerbation today  The new issue appears to be that she has last few days she has right lower extremity swelling greater than the left. There is some associated warmth    03/07/2017 Follow UP OV for Chronic DVT:  Pt. Was seen by Dr. Chase Caller 02/26/2017 for leg swelling. He was concerned about possibility of acute DVT and ordered a doppler study. The patient did not have any chest pain or shortness of breath at the time. 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 Dr. Chase Caller chose not to treat as  this is a chronic problem. He wanted the patient to be diligent with her ASA daily.The patient returns today for follow up. She continues to have no respiratory issues. Her COPD is stable and at baseline. She does  not have any chest pain, or tachycardia. She states her baseline dyspnea that she experiences with her COPD  is unchanged. She continues to have issues with  bilateral leg swelling.She is not wearing her oxygen with exertion as prescribed . She states she is wearing it at night. We did discuss the additional strain  placed on the heart when oxygen levels fall below 88%, and the importance of compliance with oxygen on exertion. Most recent hospital admission for COPD exacerbation 11/2016. She was prophylaxed with heparin for DVT prophylaxis at that time.    OV 06/04/2017  Chief Complaint  Patient presents with  . Follow-up    Pt states her SOB has recently worsened x 1 week. Pt c/o prod cough with opaque colored mucus. Pt denies CP/tightness and f/c/s.    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 Here with her husband. She initially told me that she was stable and doing well without exacerbations but has a continued to talk to her she initially told me that she's been coughing more than usual for past one day but then has been interjected saying she's been coughing more than usual for a few weeks. She agreed to this. She admitted that the sputum is a little bit more discolored than baseline. She is also more short of breath and wheezing more and coughing more than baseline.  Her recent months have been complicated by chronic DVT finding for which she says she is seeing vascular surgery Dr. Adele Barthel and his been placed on observation therapy according to history. She's also had cellulitis issues with her feet. She showed me treatment medication of clindamycin and cephalexin at [facility] and was to hospital in the middle of June 2018. She says for the last few months she's been having excessive hair loss and she's going to see dermatology for this. She's wondering if antibiotic and prednisone and contributing to hair loss. At the same time she is worried about recurrent COPD exacerbation and leaving it untreated.   Results for SAANVIKA, VAZQUES (MRN [id number]) as of 06/04/2017 12:27  Ref. Range 08/16/2016 14:35 10/26/2016 07:04  FEV1-Post Latest Units: L 1.37 1.03  FEV1-%Pred-Post Latest Units: % 56 44  FEV1-%Change-Post Latest Units: % 0 22    has a past medical history of Cancer ([place]) (2010); Complication of anesthesia; COPD (chronic obstructive pulmonary disease) ([place]); Diverticulitis; Emphysema of lung ([place]); Fibromyalgia (1995); GERD (gastroesophageal reflux disease); Hearing loss of both ears; colonic polyps; Hyperlipidemia; Hypertension; Hypothyroidism; Osteoporosis; Peripheral vascular disease [facility]) (May '12 - CT angio); Shortness of breath dyspnea; Varicose veins of leg with swelling; and Wears glasses.   reports that she quit  smoking about 14 months ago. Her smoking use included Cigarettes. She has a 37.50 pack-year smoking history. She has never used smokeless tobacco.  Past Surgical History:  Procedure Laterality Date  . ABDOMINAL HYSTERECTOMY  1973   metorrhagia  . APPENDECTOMY    . BREAST SURGERY  2010   mastectomy with reconstructive surgery  . BREAST SURGERY  2010   rt lump x2  . BREAST SURGERY  2010   partial rt br reduction  . CARDIAC CATHETERIZATION  '11   no obstructive coronary disease  . COLECTOMY  2002   sigmoid  . COLONOSCOPY W/ POLYPECTOMY    . LUNG SURGERY  9/12   rt mid lung wedge  . PARTIAL MASTECTOMY WITH NEEDLE LOCALIZATION Left 02/23/2013   Procedure:  LEFT PARTIAL MASTECTOMY WITH NEEDLE LOCALIZATION;  Surgeon: Adin Hector, MD;  Location: [facility];  Service: General;  Laterality:  Left;  . [place]   right upper lobe of lung as part of VAT-nodule/benign    Allergies  Allergen Reactions  . Sulfonamide Derivatives Swelling    Swelling in face    Immunization History  Administered Date(s) Administered  . Influenza,inj,Quad PF,6+ Mos 08/10/2014, 08/12/2015, 07/03/2016  . Pneumococcal Conjugate-13 08/10/2014  . Pneumococcal Polysaccharide-23 08/12/2015  . Td 10/16/2007  . Zoster 11/09/2010    Family History  Problem Relation Age of Onset  . Colon cancer Mother        colon  . COPD Mother        brown lung  . Hypertension Mother   . Diabetes Mother   . Emphysema Mother   . Coronary artery disease Father   . Heart attack Father   . Sudden death Father   . Heart disease Father   . Cancer Sister        fallopian tube  . Hypertension Sister   . Coronary artery disease Brother   . Throat cancer Sister   . Melanoma Sister   . Hypertension Sister   . Pancreatic cancer Sister   . Cancer Sister   . Heart attack Brother        early 40's  . Heart failure Sister      Current Outpatient Prescriptions:  .  ADVAIR DISKUS 100-50  MCG/DOSE AEPB, INHALE 1 PUFF INTO THE LUNGS 2 TIMES DAILY., Disp: 180 each, Rfl: 1 .  albuterol (PROVENTIL HFA;VENTOLIN HFA) 108 (90 Base) MCG/ACT inhaler, Inhale 2 puffs into the lungs every 4 (four) hours as needed for wheezing. As a RESCUE medication, Disp: 1 Inhaler, Rfl: 1 .  amitriptyline (ELAVIL) 50 MG tablet, TAKE 1 TABLET (50 MG TOTAL) BY MOUTH AT BEDTIME., Disp: 90 tablet, Rfl: 3 .  amLODipine (NORVASC) 5 MG tablet, Take 1 tablet (5 mg total) by mouth daily., Disp: 90 tablet, Rfl: 1 .  aspirin 81 MG tablet, Take 81 mg by mouth every evening. , Disp: , Rfl:  .  atorvastatin (LIPITOR) 80 MG tablet, TAKE 1 TABLET EVERY DAY. PLEASE KEEP UPCOMING APPT.THANK YOU, Disp: 90 tablet, Rfl: 3 .  bisacodyl (DULCOLAX) 5 MG EC tablet, Take 1 tablet (5 mg total) by mouth daily as needed for moderate constipation. (Patient taking differently: Take 5 mg by mouth daily. ), Disp: 30 tablet, Rfl: 11 .  buPROPion (WELLBUTRIN SR) 150 MG 12 hr tablet, Take 1 tablet (150 mg total) by mouth 2 (two) times daily., Disp: 180 tablet, Rfl: 1 .  cilostazol (PLETAL) 100 MG tablet, Take 1 tablet (100 mg total) by mouth 2 (two) times daily. (Patient taking differently: Take 100 mg by mouth daily. ), Disp: 180 tablet, Rfl: 1 .  diclofenac (VOLTAREN) 75 MG EC tablet, TAKE ONE TABLET BY MOUTH TWICE DAILY, Disp: 60 tablet, Rfl: 7 .  fluticasone (FLONASE) 50 MCG/ACT nasal spray, USE 2 SPRAYS IN BOTH NOSTRILS DAILY., Disp: 48 g, Rfl: 3 .  guaiFENesin (MUCINEX) 600 MG 12 hr tablet, Take 600 mg by mouth 2 (two) times daily as needed. , Disp: , Rfl:  .  ipratropium-albuterol (DUONEB) 0.5-2.5 (3) MG/3ML SOLN, USE 1 NEB 3 TIMES DAILY FOR 3 DAYS, THEN EVERY 6 HOURS AS NEEDED., Disp: 360 mL, Rfl: 0 .  levothyroxine (SYNTHROID, LEVOTHROID) 112 MCG tablet, TAKE ONE TABLET BY MOUTH ONE TIME DAILY, Disp: 90 tablet, Rfl: 3 .  LORazepam (ATIVAN) 2 MG tablet, TAKE 1 TABLET BY MOUTH AT BEDTIME FOR ANXIETY, Disp: 30 tablet, Rfl: 2 .  metoprolol  tartrate (LOPRESSOR) 25 MG tablet, TAKE 1 TABLET BY MOUTH 2 TIMES DAILY. (Patient taking differently: 1qdpm), Disp: 180 tablet, Rfl: 3 .  omeprazole (PRILOSEC) 40 MG capsule, TAKE 1 CAPSULE (40 MG TOTAL) BY MOUTH 2 (TWO) TIMES DAILY. (Patient taking differently: Take 40 mg by mouth daily. ), Disp: 180 capsule, Rfl: 3 .  OXYGEN, Inhale 2 L into the lungs at bedtime., Disp: , Rfl:     Review of Systems     Objective:   Physical Exam  Constitutional: She is oriented to person, place, and time. She appears well-developed and well-nourished. No distress.  HENT:  Head: Normocephalic and atraumatic.  Right Ear: External ear normal.  Left Ear: External ear normal.  Mouth/Throat: Oropharynx is clear and moist. No oropharyngeal exudate.  Eyes: Pupils are equal, round, and reactive to light. Conjunctivae and EOM are normal. Right eye exhibits no discharge. Left eye exhibits no discharge. No scleral icterus.  Neck: Normal range of motion. Neck supple. No JVD present. No tracheal deviation present. No thyromegaly present.  Cardiovascular: Normal rate, regular rhythm, normal heart sounds and intact distal pulses.  Exam reveals no gallop and no friction rub.   No murmur heard. Pulmonary/Chest: Effort normal. No respiratory distress. She has wheezes. She has no rales. She exhibits no tenderness.  Abdominal: Soft. Bowel sounds are normal. She exhibits no distension and no mass. There is no tenderness. There is no rebound and no guarding.  Musculoskeletal: Normal range of motion. She exhibits no edema or tenderness.  Lymphadenopathy:    She has no cervical adenopathy.  Neurological: She is alert and oriented to person, place, and time. She has normal reflexes. No cranial nerve deficit. She exhibits normal muscle tone. Coordination normal.  Skin: Skin is warm and dry. No rash noted. She is not diaphoretic. No erythema. No pallor.  Psychiatric: She has a normal mood and affect. Her behavior is normal.  Judgment and thought content normal.  Vitals reviewed.  Vitals:   06/04/17 1159  BP: 112/64  Pulse: 69  SpO2: 95%  Weight: 152 lb (68.9 kg)  Height: 5\' 6"  (1.676 m)    Estimated body mass index is 24.53 kg/m as calculated from the following:   Height as of this encounter: 5\' 6"  (1.676 m).   Weight as of this encounter: 152 lb (68.9 kg).     Assessment:       ICD-10-CM   1. COPD exacerbation ([place]) J44.1   2. Stage 3 severe COPD by GOLD classification ([place]) J44.9        Plan:      Please take prednisone 40 mg x1 day, then 30 mg x1 day, then 20 mg x1 day, then 10 mg x1 day, and then 5 mg x1 day and stop  STart cephalexin 500mg  three times daily x  5 days  Continue spiriva and symbicort daily scheduled as before  Take product insert on daliresp to see if this is something that we could consider to prevent copd flare ups  Use albuterol as needed  Flu shot in fall   Please talk to PCP Hoyt Koch, MD -  and ensure you discuss  shingarix vaccine   Followoup 3 months or sooner if needed     Dr. Brand Males, M.D., [facility].C.P Pulmonary and Critical Care Medicine Staff Physician [place] Pager: [phone], If no answer or between  15:00h - 7:00h: call [phone]  06/04/2017  12:47 PM

## 2017-06-05 ENCOUNTER — Ambulatory Visit (HOSPITAL_BASED_OUTPATIENT_CLINIC_OR_DEPARTMENT_OTHER): Payer: Medicare Other | Admitting: Oncology

## 2017-06-05 VITALS — BP 119/79 | HR 87 | Temp 98.0°F | Resp 18 | Ht 66.0 in | Wt 153.7 lb

## 2017-06-05 DIAGNOSIS — Z9221 Personal history of antineoplastic chemotherapy: Secondary | ICD-10-CM

## 2017-06-05 DIAGNOSIS — Z170 Estrogen receptor positive status [ER+]: Secondary | ICD-10-CM

## 2017-06-05 DIAGNOSIS — D473 Essential (hemorrhagic) thrombocythemia: Secondary | ICD-10-CM | POA: Diagnosis not present

## 2017-06-05 DIAGNOSIS — C50819 Malignant neoplasm of overlapping sites of unspecified female breast: Secondary | ICD-10-CM

## 2017-06-05 DIAGNOSIS — Z853 Personal history of malignant neoplasm of breast: Secondary | ICD-10-CM

## 2017-06-05 DIAGNOSIS — C50921 Malignant neoplasm of unspecified site of right male breast: Secondary | ICD-10-CM

## 2017-06-05 DIAGNOSIS — C50811 Malignant neoplasm of overlapping sites of right female breast: Secondary | ICD-10-CM | POA: Insufficient documentation

## 2017-06-05 NOTE — Progress Notes (Signed)
ID: [name]   DOB: 02-08-1946  MR#: [id number]  POE#:[id number]  PCP: Hoyt Koch, MD GYN:  SU:  OTHER MD:  Chief complaint: followup visit for breast cancer  Current treatment: Observation  HISTORY OF PRESENT ILLNESS:T1 C. 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 She did not require any chest wall radiation. She was on Femara for a short while before changing to Arimidex.   INTERVAL HISTORY:  [name] had "graduate" at her last visit here, 2 years ago. She continued breast cancer screening through her primary care physician with yearly mammography. On 05/01/2017 left mammography at [facility] was unremarkable. Nevertheless she is very concerned because she has "dense breasts" (category C) and a strong family history.   However lab work showed mild thrombocytosis, with a platelet count 11/16/2016 being 410,000. Repeat the next day was 391,000, but repeat on 03/16/2017 was 443,000. She was referred back for further evaluation and treatment of this problem.  In addition, in early June she was noted to have a right leg swelling. She was evaluated with Doppler ultrasonography which showed no acute DVT in the right lower extremity and no Baker's cyst (03/16/2017). CT angiography on the same day showed no pulmonary embolus. 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  REVIEW OF SYSTEMS: She denies unexplained fatigue, drenching sweats, or disabling arthritis. Weight is up 4 pounds as compared to 2 months ago. They have been no unusual headaches, visual changes, nausea, or vomiting. A detailed review of  systems today was otherwise stable  PAST MEDICAL HISTORY: Past Medical History:  Diagnosis Date  . Cancer ([place]) 2010   invasive ductal breast cancer; on tamoxifen  . Complication of anesthesia    was in ICU after lung surgery 2012  . COPD (chronic obstructive pulmonary disease) ([place])   . Diverticulitis    required colcectomy  . Emphysema of lung ([place])   . Fibromyalgia 1995  . GERD (gastroesophageal reflux disease)   . Hearing loss of both ears    uses hearing aids  bilaterally  . Hx of colonic polyps    Dr. Cristina Gong -last study '11  . Hyperlipidemia   . Hypertension   . Hypothyroidism   . Osteoporosis   . Peripheral vascular disease [facility]) May '12 - CT angio   SMA chronic occlusion; stenosis of celiac trunk.  . Shortness of breath dyspnea   . Varicose veins of leg with swelling    varicose vein surgery - Dr. Aleda Grana  . Wears glasses     PAST SURGICAL HISTORY: Past Surgical History:  Procedure Laterality Date  . ABDOMINAL HYSTERECTOMY  1973   metorrhagia  . APPENDECTOMY    . BREAST SURGERY  2010   mastectomy with reconstructive surgery  . BREAST SURGERY  2010   rt lump x2  . BREAST SURGERY  2010   partial rt br reduction  . CARDIAC CATHETERIZATION  '11   no obstructive coronary disease  . COLECTOMY  2002   sigmoid  . COLONOSCOPY W/ POLYPECTOMY    . LUNG SURGERY  9/12   rt mid lung wedge  . PARTIAL MASTECTOMY WITH NEEDLE LOCALIZATION Left  02/23/2013   Procedure:  LEFT PARTIAL MASTECTOMY WITH NEEDLE LOCALIZATION;  Surgeon: Adin Hector, MD;  Location: [place];  Service: General;  Laterality: Left;  . [place]   right upper lobe of lung as part of VAT-nodule/benign     FAMILY HISTORY Family History  Problem Relation Age of Onset  . Colon cancer Mother        colon  . COPD Mother        brown lung  . Hypertension Mother   . Diabetes Mother   . Emphysema Mother   . Coronary artery disease Father   . Heart attack Father    . Sudden death Father   . Heart disease Father   . Cancer Sister        fallopian tube  . Hypertension Sister   . Coronary artery disease Brother   . Throat cancer Sister   . Melanoma Sister   . Hypertension Sister   . Pancreatic cancer Sister   . Cancer Sister   . Heart attack Brother        early 53's  . Heart failure Sister     HEALTH MAINTENANCE: Social History  Substance Use Topics  . Smoking status: Former Smoker    Packs/day: 0.75    Years: 50.00    Types: Cigarettes    Quit date: 03/18/2016  . Smokeless tobacco: Never Used  . Alcohol use 6.0 oz/week    10 Cans of beer per week     Comment: occ     Allergies  Allergen Reactions  . Sulfonamide Derivatives Swelling    Swelling in face    Current Outpatient Prescriptions  Medication Sig Dispense Refill  . ADVAIR DISKUS 100-50 MCG/DOSE AEPB INHALE 1 PUFF INTO THE LUNGS 2 TIMES DAILY. 180 each 1  . albuterol (PROVENTIL HFA;VENTOLIN HFA) 108 (90 Base) MCG/ACT inhaler Inhale 2 puffs into the lungs every 4 (four) hours as needed for wheezing. As a RESCUE medication 1 Inhaler 1  . amitriptyline (ELAVIL) 50 MG tablet TAKE 1 TABLET (50 MG TOTAL) BY MOUTH AT BEDTIME. 90 tablet 3  . amLODipine (NORVASC) 5 MG tablet Take 1 tablet (5 mg total) by mouth daily. 90 tablet 1  . aspirin 81 MG tablet Take 81 mg by mouth every evening.     Marland Kitchen atorvastatin (LIPITOR) 80 MG tablet TAKE 1 TABLET EVERY DAY. PLEASE KEEP UPCOMING APPT.THANK YOU 90 tablet 3  . bisacodyl (DULCOLAX) 5 MG EC tablet Take 1 tablet (5 mg total) by mouth daily as needed for moderate constipation. (Patient taking differently: Take 5 mg by mouth daily. ) 30 tablet 11  . buPROPion (WELLBUTRIN SR) 150 MG 12 hr tablet Take 1 tablet (150 mg total) by mouth 2 (two) times daily. 180 tablet 1  . cephALEXin (KEFLEX) 500 MG capsule Take 1 capsule (500 mg total) by mouth 3 (three) times daily. 15 capsule 0  . cilostazol (PLETAL) 100 MG tablet Take 1 tablet (100 mg total) by  mouth 2 (two) times daily. (Patient taking differently: Take 100 mg by mouth daily. ) 180 tablet 1  . diclofenac (VOLTAREN) 75 MG EC tablet TAKE ONE TABLET BY MOUTH TWICE DAILY 60 tablet 7  . fluticasone (FLONASE) 50 MCG/ACT nasal spray USE 2 SPRAYS IN BOTH NOSTRILS DAILY. 48 g 3  . guaiFENesin (MUCINEX) 600 MG 12 hr tablet Take 600 mg by mouth 2 (two) times daily as needed.     Marland Kitchen ipratropium-albuterol (DUONEB) 0.5-2.5 (3)  MG/3ML SOLN USE 1 NEB 3 TIMES DAILY FOR 3 DAYS, THEN EVERY 6 HOURS AS NEEDED. 360 mL 0  . levothyroxine (SYNTHROID, LEVOTHROID) 112 MCG tablet TAKE ONE TABLET BY MOUTH ONE TIME DAILY 90 tablet 3  . LORazepam (ATIVAN) 2 MG tablet TAKE 1 TABLET BY MOUTH AT BEDTIME FOR ANXIETY 30 tablet 2  . metoprolol tartrate (LOPRESSOR) 25 MG tablet TAKE 1 TABLET BY MOUTH 2 TIMES DAILY. (Patient taking differently: 1qdpm) 180 tablet 3  . omeprazole (PRILOSEC) 40 MG capsule TAKE 1 CAPSULE (40 MG TOTAL) BY MOUTH 2 (TWO) TIMES DAILY. (Patient taking differently: Take 40 mg by mouth daily. ) 180 capsule 3  . OXYGEN Inhale 2 L into the lungs at bedtime.    . predniSONE (DELTASONE) 10 MG tablet 40 mg x1 day, then 30 mg x1 day, then 20 mg x1 day, then 10 mg x1 day, and then 5 mg x1 day and stop 11 tablet 0   No current facility-administered medications for this visit.     OBJECTIVE:  Middle-aged white [name] Who appears stated age  Blood pressure 119/79, pulse 87, temperature 98 F (36.7 C), temperature source Oral, resp. rate 18, height 5\' 6"  (1.676 m), weight 153 lb 11.2 oz (69.7 kg), SpO2 91 %.    ECOG: 1  Sclerae unicteric, EOMs intact Oropharynx clear and moist No cervical or supraclavicular adenopathy Lungs no rales or rhonchi Heart regular rate and rhythm Abd soft, nontender, positive bowel sounds MSK no focal spinal tenderness, no upper extremity lymphedema Neuro: nonfocal, well oriented, appropriate affect Breasts: The right breast as undergone mastectomy and reconstruction. There is  no evidence of local recurrence. The left breast is benign. Both axillae are benign.  LAB RESULTS: Lab Results  Component Value Date   WBC 11.0 (H) 03/16/2017   NEUTROABS 5.9 03/16/2017   HGB 14.3 03/16/2017   HCT 42.4 03/16/2017   MCV 87.8 03/16/2017   PLT 443 (H) 03/16/2017      Chemistry      Component Value Date/Time   NA 139 03/16/2017 1428   NA 141 02/17/2015 1249   K 4.0 03/16/2017 1428   K 4.3 02/17/2015 1249   CL 106 03/16/2017 1428   CL 108 (H) 08/21/2012 1501   CO2 27 03/16/2017 1428   CO2 25 02/17/2015 1249   BUN 12 03/16/2017 1428   BUN 16.6 02/17/2015 1249   CREATININE 0.81 03/16/2017 1428   CREATININE 0.8 02/17/2015 1249      Component Value Date/Time   CALCIUM 8.8 (L) 03/16/2017 1428   CALCIUM 9.3 02/17/2015 1249   ALKPHOS 68 11/17/2016 0557   ALKPHOS 77 02/17/2015 1249   AST 37 11/17/2016 0557   AST 17 02/17/2015 1249   ALT 26 11/17/2016 0557   ALT 23 02/17/2015 1249   BILITOT 0.7 11/17/2016 0557   BILITOT 0.38 02/17/2015 1249       Lab Results  Component Value Date   LABCA2 29 08/07/2011    No components found for: [id number]  No results for input(s): INR in the last 168 hours.  Urinalysis    Component Value Date/Time   COLORURINE YELLOW 03/23/2013 0248   APPEARANCEUR CLEAR 03/23/2013 0248   LABSPEC 1.019 03/23/2013 0248   LABSPEC 1.030 03/08/2009 1016   PHURINE 6.0 03/23/2013 0248   GLUCOSEU NEGATIVE 03/23/2013 0248   GLUCOSEU NEGATIVE 09/07/2009 0752   HGBUR NEGATIVE 03/23/2013 0248   HGBUR negative 11/09/2010 [place] 03/23/2013 0248   BILIRUBINUR Negative 03/08/2009 1016  KETONESUR NEGATIVE 03/23/2013 0248   PROTEINUR NEGATIVE 03/23/2013 0248   UROBILINOGEN 0.2 03/23/2013 0248   NITRITE NEGATIVE 03/23/2013 0248   LEUKOCYTESUR NEGATIVE 03/23/2013 0248   LEUKOCYTESUR Negative 03/08/2009 1016   STUDIES. 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  ASSESSMENT: 71 y.o. [name]: history of right breast cancer: pT1c pN0, stage IA, estrogen and progesterone receptor positive 1. S/p right mastectomy with myocutaneous flap 09/19/2009  2. Adjuvant chemo with 4 cycles of  Taxotere/  Cytoxan  3. Femara, followed by Arimidex, started roughly Jan 2011, with a few months' interruption  4.  Completed 5 years of antiestrogen therapy June 2016  5.  Also s/p left breast lumpectomy in may 2014 ,negative for atypia or malignancy   PLAN:  We reviewed the multiple possible causes of thrombocytosis. In her case there is significant inflammation as noted by her elevated CRP. That in itself is sufficient to explain her mild thrombocytosis. However, just to make sure, we will send a JAK2 with the next set of labs, scheduled for August 31.  She is very concerned because mammography has not been sensitive in her case. She does have dense breasts and a family history of fallopian tube cancer. I think it would be reasonable for her to have a breast MRI and I have set her up for that.  Otherwise she will see me again in mid September. At that point I think she could be transitioned to the survivorship program.  She knows to call for any other problems that may develop before the next visit here.  Chauncey Cruel, MD  Medical oncology   06/08/2017

## 2017-06-11 ENCOUNTER — Other Ambulatory Visit: Payer: Self-pay

## 2017-06-11 DIAGNOSIS — C50811 Malignant neoplasm of overlapping sites of right female breast: Secondary | ICD-10-CM

## 2017-06-11 DIAGNOSIS — Z170 Estrogen receptor positive status [ER+]: Secondary | ICD-10-CM

## 2017-06-11 DIAGNOSIS — C50921 Malignant neoplasm of unspecified site of right male breast: Secondary | ICD-10-CM

## 2017-06-11 DIAGNOSIS — C801 Malignant (primary) neoplasm, unspecified: Secondary | ICD-10-CM

## 2017-06-11 DIAGNOSIS — C50819 Malignant neoplasm of overlapping sites of unspecified female breast: Secondary | ICD-10-CM

## 2017-06-14 ENCOUNTER — Other Ambulatory Visit: Payer: Self-pay | Admitting: *Deleted

## 2017-06-14 ENCOUNTER — Other Ambulatory Visit (HOSPITAL_BASED_OUTPATIENT_CLINIC_OR_DEPARTMENT_OTHER): Payer: Medicare Other

## 2017-06-14 DIAGNOSIS — D473 Essential (hemorrhagic) thrombocythemia: Secondary | ICD-10-CM

## 2017-06-14 DIAGNOSIS — D75839 Thrombocytosis, unspecified: Secondary | ICD-10-CM

## 2017-06-14 DIAGNOSIS — C50921 Malignant neoplasm of unspecified site of right male breast: Secondary | ICD-10-CM

## 2017-06-14 DIAGNOSIS — Z170 Estrogen receptor positive status [ER+]: Secondary | ICD-10-CM

## 2017-06-14 DIAGNOSIS — C50811 Malignant neoplasm of overlapping sites of right female breast: Secondary | ICD-10-CM | POA: Diagnosis not present

## 2017-06-14 DIAGNOSIS — C50819 Malignant neoplasm of overlapping sites of unspecified female breast: Secondary | ICD-10-CM

## 2017-06-14 DIAGNOSIS — L659 Nonscarring hair loss, unspecified: Secondary | ICD-10-CM | POA: Diagnosis not present

## 2017-06-14 DIAGNOSIS — L239 Allergic contact dermatitis, unspecified cause: Secondary | ICD-10-CM | POA: Diagnosis not present

## 2017-06-14 LAB — CBC WITH DIFFERENTIAL/PLATELET
BASO%: 0.6 % (ref 0.0–2.0)
Basophils Absolute: 0 10*3/uL (ref 0.0–0.1)
EOS%: 7.8 % — ABNORMAL HIGH (ref 0.0–7.0)
Eosinophils Absolute: 0.6 10*3/uL — ABNORMAL HIGH (ref 0.0–0.5)
HCT: 42.4 % (ref 34.8–46.6)
HGB: 13.7 g/dL (ref 11.6–15.9)
LYMPH%: 14.6 % (ref 14.0–49.7)
MCH: 28.4 pg (ref 25.1–34.0)
MCHC: 32.3 g/dL (ref 31.5–36.0)
MCV: 87.8 fL (ref 79.5–101.0)
MONO#: 0.7 10*3/uL (ref 0.1–0.9)
MONO%: 10.2 % (ref 0.0–14.0)
NEUT#: 4.8 10*3/uL (ref 1.5–6.5)
NEUT%: 66.8 % (ref 38.4–76.8)
Platelets: 389 10*3/uL (ref 145–400)
RBC: 4.83 10*6/uL (ref 3.70–5.45)
RDW: 14.9 % — ABNORMAL HIGH (ref 11.2–14.5)
WBC: 7.2 10*3/uL (ref 3.9–10.3)
lymph#: 1.1 10*3/uL (ref 0.9–3.3)

## 2017-06-14 LAB — CHCC SMEAR

## 2017-06-14 LAB — FERRITIN: Ferritin: 21 ng/ml (ref 9–269)

## 2017-06-15 ENCOUNTER — Other Ambulatory Visit: Payer: Self-pay | Admitting: Internal Medicine

## 2017-06-15 DEATH — deceased

## 2017-06-18 ENCOUNTER — Other Ambulatory Visit: Payer: Medicare Other

## 2017-06-18 NOTE — Telephone Encounter (Signed)
Faxed script back to [facility] pharmacy...Johny Chess

## 2017-06-18 NOTE — Telephone Encounter (Signed)
Check [place] last filled 05/16/17. pls advise..,Johny Chess

## 2017-06-19 ENCOUNTER — Ambulatory Visit
Admission: RE | Admit: 2017-06-19 | Discharge: 2017-06-19 | Disposition: A | Discharge status: Home / Self Care | Payer: Medicare Other | Source: Ambulatory Visit | Attending: Oncology | Admitting: Oncology

## 2017-06-19 DIAGNOSIS — C801 Malignant (primary) neoplasm, unspecified: Secondary | ICD-10-CM

## 2017-06-19 DIAGNOSIS — C50921 Malignant neoplasm of unspecified site of right male breast: Secondary | ICD-10-CM

## 2017-06-19 DIAGNOSIS — C50819 Malignant neoplasm of overlapping sites of unspecified female breast: Secondary | ICD-10-CM

## 2017-06-19 DIAGNOSIS — C50811 Malignant neoplasm of overlapping sites of right female breast: Secondary | ICD-10-CM

## 2017-06-19 DIAGNOSIS — Z170 Estrogen receptor positive status [ER+]: Secondary | ICD-10-CM

## 2017-06-20 ENCOUNTER — Other Ambulatory Visit: Payer: Medicare Other

## 2017-06-20 ENCOUNTER — Other Ambulatory Visit: Payer: Self-pay | Admitting: Oncology

## 2017-06-20 DIAGNOSIS — Z853 Personal history of malignant neoplasm of breast: Secondary | ICD-10-CM

## 2017-06-21 ENCOUNTER — Ambulatory Visit (HOSPITAL_BASED_OUTPATIENT_CLINIC_OR_DEPARTMENT_OTHER): Payer: Medicare Other | Admitting: Oncology

## 2017-06-21 VITALS — BP 109/75 | HR 74 | Temp 98.2°F | Resp 17 | Ht 66.0 in | Wt 154.6 lb

## 2017-06-21 DIAGNOSIS — Z9223 Personal history of estrogen therapy: Secondary | ICD-10-CM

## 2017-06-21 DIAGNOSIS — C50911 Malignant neoplasm of unspecified site of right female breast: Secondary | ICD-10-CM

## 2017-06-21 DIAGNOSIS — D75839 Thrombocytosis, unspecified: Secondary | ICD-10-CM

## 2017-06-21 DIAGNOSIS — D473 Essential (hemorrhagic) thrombocythemia: Secondary | ICD-10-CM | POA: Diagnosis not present

## 2017-06-21 DIAGNOSIS — Z170 Estrogen receptor positive status [ER+]: Principal | ICD-10-CM

## 2017-06-21 DIAGNOSIS — Z853 Personal history of malignant neoplasm of breast: Secondary | ICD-10-CM | POA: Diagnosis not present

## 2017-06-21 NOTE — Progress Notes (Signed)
ID: [name]   DOB: Oct 21, 1945  MR#: [id number]  IEP#:[id number]  PCP: Hoyt Koch, MD GYN:  SU:  OTHER MD:  Chief complaint: followup visit for breast cancer  Current treatment: Observation  New chief complaint: THROMBOCYTOSIS  [name] had "graduated" at her last visit here, 2 years ago. She continued breast cancer screening through her primary care physician with yearly mammography. On 05/01/2017 left mammography at [facility] was unremarkable. Nevertheless she is very concerned because she has "dense breasts" (category C) and a strong family history.   However lab work showed mild thrombocytosis, with a platelet count 11/16/2016 being 410,000. Repeat the next day was 391,000, but repeat on 03/16/2017 was 443,000. She was referred back for further evaluation and treatment of this problem.  In addition, in early June she was noted to have a right leg swelling. She was evaluated with Doppler ultrasonography which showed no acute DVT in the right lower extremity and no Baker's cyst (03/16/2017). CT angiography on the same day showed no pulmonary embolus. 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  INTERVAL HISTORY: [name] returns today to review the results of her workup for thrombocytosis. At the last visit we obtained a set of labs. The first thing to notice is that her thrombocytosis is minimal and intermittent. She had platelet counts of 410 on 11/16/2016 but 391 on 11/17/2016. On 03/16/2017 the platelet count was 443, but on  it was 389.  Among the many possible reasons for mild intermittent thrombocytosis, iron deficiency is the most common. She has a ferritin of 21 which is technically in the normal range but it certainly on the low side.  She also had an elevated C-reactive protein at 6.1, although her sedimentation rate  was normal at 20 in this lab. Importantly she had a positive ANA titer of 1:160. Rheumatoid factor was negative and labs for Sjogren's were likewise noncontributory.  She is here today to discuss those results  Finally we had set her up for breast MRI but for technical reasons those images did not process. She is scheduled for repeat 06/26/2017   REVIEW OF SYSTEMS: She has arthritis particularly involving her hands. She continues to have problems with a rash, which has been variably evaluated by dermatology both here and at [facility], and by rheumatology. She denies fevers, pruritus, bleeding, unusual headaches, visual changes, nausea, vomiting, cough, phlegm production, or pleurisy. A detailed review of systems was otherwise stable.  HISTORY OF BREAST CANCER:  Remote T1c. 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 She did not require any chest wall radiation. She was on Femara for a short while before changing to Arimidex.    PAST MEDICAL HISTORY: Past Medical History:  Diagnosis Date   Cancer ([place]) 2010   invasive ductal breast cancer; on tamoxifen   Complication of anesthesia    was in ICU after lung surgery 2012   COPD (chronic obstructive pulmonary disease) ([place])    Diverticulitis    required colcectomy   Emphysema of lung ([place])    Fibromyalgia 1995   GERD (gastroesophageal reflux disease)    Hearing loss of both ears  uses hearing aids  bilaterally   Hx of colonic polyps    Dr. Cristina Gong -last study '11   Hyperlipidemia    Hypertension    Hypothyroidism    Osteoporosis    Peripheral vascular disease [facility]) May '12 - CT angio   SMA chronic occlusion; stenosis of celiac trunk.   Shortness of breath dyspnea    Varicose  veins of leg with swelling    varicose vein surgery - Dr. Aleda Grana   Wears glasses     PAST SURGICAL HISTORY: Past Surgical History:  Procedure Laterality Date   ABDOMINAL HYSTERECTOMY  1973   metorrhagia   APPENDECTOMY     BREAST SURGERY  2010   mastectomy with reconstructive surgery   BREAST SURGERY  2010   rt lump x2   BREAST SURGERY  2010   partial rt br reduction   CARDIAC CATHETERIZATION  '11   no obstructive coronary disease   COLECTOMY  2002   sigmoid   COLONOSCOPY W/ POLYPECTOMY     LUNG SURGERY  9/12   rt mid lung wedge   PARTIAL MASTECTOMY WITH NEEDLE LOCALIZATION Left 02/23/2013   Procedure:  LEFT PARTIAL MASTECTOMY WITH NEEDLE LOCALIZATION;  Surgeon: Adin Hector, MD;  Location: [place];  Service: General;  Laterality: Left;   [place]   right upper lobe of lung as part of VAT-nodule/benign     FAMILY HISTORY Family History  Problem Relation Age of Onset   Colon cancer Mother        colon   COPD Mother        brown lung   Hypertension Mother    Diabetes Mother    Emphysema Mother    Coronary artery disease Father    Heart attack Father    Sudden death Father    Heart disease Father    Cancer Sister        fallopian tube   Hypertension Sister    Coronary artery disease Brother    Throat cancer Sister    Melanoma Sister    Hypertension Sister    Pancreatic cancer Sister    Cancer Sister    Heart attack Brother        early 45's   Heart failure Sister     HEALTH MAINTENANCE: Social History  Substance Use Topics   Smoking status: Former Smoker    Packs/day: 0.75    Years: 50.00    Types: Cigarettes    Quit date: 03/18/2016   Smokeless tobacco: Never Used   Alcohol use 6.0 oz/week    10 Cans of beer per week     Comment: occ     Allergies  Allergen Reactions   Sulfonamide Derivatives Swelling    Swelling in face    Current Outpatient Prescriptions  Medication  Sig Dispense Refill   ADVAIR DISKUS 100-50 MCG/DOSE AEPB INHALE 1 PUFF INTO THE LUNGS 2 TIMES DAILY. 180 each 1   albuterol (PROVENTIL HFA;VENTOLIN HFA) 108 (90 Base) MCG/ACT inhaler Inhale 2 puffs into the lungs every 4 (four) hours as needed for wheezing. As a RESCUE medication 1 Inhaler 1   amitriptyline (ELAVIL) 50 MG tablet TAKE 1 TABLET (50 MG TOTAL) BY MOUTH AT BEDTIME. 90 tablet 3   amLODipine (NORVASC) 5 MG tablet Take 1 tablet (5 mg total) by mouth daily. 90 tablet 1   aspirin 81 MG tablet Take 81 mg by mouth every evening.      atorvastatin (LIPITOR) 80 MG  tablet TAKE 1 TABLET EVERY DAY. PLEASE KEEP UPCOMING APPT.THANK YOU 90 tablet 3   bisacodyl (DULCOLAX) 5 MG EC tablet Take 1 tablet (5 mg total) by mouth daily as needed for moderate constipation. (Patient taking differently: Take 5 mg by mouth daily. ) 30 tablet 11   buPROPion (WELLBUTRIN SR) 150 MG 12 hr tablet Take 1 tablet (150 mg total) by mouth 2 (two) times daily. 180 tablet 1   cilostazol (PLETAL) 100 MG tablet Take 1 tablet (100 mg total) by mouth 2 (two) times daily. (Patient taking differently: Take 100 mg by mouth daily. ) 180 tablet 1   diclofenac (VOLTAREN) 75 MG EC tablet TAKE ONE TABLET BY MOUTH TWICE DAILY 60 tablet 7   fluticasone (FLONASE) 50 MCG/ACT nasal spray USE 2 SPRAYS IN BOTH NOSTRILS DAILY. 48 g 3   guaiFENesin (MUCINEX) 600 MG 12 hr tablet Take 600 mg by mouth 2 (two) times daily as needed.      ipratropium-albuterol (DUONEB) 0.5-2.5 (3) MG/3ML SOLN USE 1 NEB 3 TIMES DAILY FOR 3 DAYS, THEN EVERY 6 HOURS AS NEEDED. 360 mL 0   levothyroxine (SYNTHROID, LEVOTHROID) 112 MCG tablet TAKE ONE TABLET BY MOUTH ONE TIME DAILY 90 tablet 3   LORazepam (ATIVAN) 2 MG tablet TAKE 1 TABLET AT BEDTIME FOR ANXIETY. 30 tablet 0   metoprolol tartrate (LOPRESSOR) 25 MG tablet TAKE 1 TABLET BY MOUTH 2 TIMES DAILY. (Patient taking differently: 1qdpm) 180 tablet 3   omeprazole (PRILOSEC) 40 MG capsule TAKE 1 CAPSULE  (40 MG TOTAL) BY MOUTH 2 (TWO) TIMES DAILY. (Patient taking differently: Take 40 mg by mouth daily. ) 180 capsule 3   OXYGEN Inhale 2 L into the lungs at bedtime.     No current facility-administered medications for this visit.     OBJECTIVE:  Middle-aged white woman In no acute distress  Blood pressure 109/75, pulse 74, temperature 98.2 F (36.8 C), temperature source Oral, resp. rate 17, height 5\' 6"  (1.676 m), weight 154 lb 9.6 oz (70.1 kg), SpO2 93 %.    ECOG: 1  Sclerae unicteric, EOMs intact Oropharynx clear and moist No cervical or supraclavicular adenopathy Lungs no rales or rhonchi Heart regular rate and rhythm Abd soft, nontender, positive bowel sounds MSK no focal spinal tenderness, no upper extremity lymphedema Neuro: nonfocal, well oriented, appropriate affect Breasts: The right breast is status post mastectomy followed by reconstruction. There is no evidence of local recurrence. The left breast is status post reduction mammoplasty. Both axillae are benign.   LAB RESULTS: Lab Results  Component Value Date   WBC 7.2    NEUTROABS 4.8    HGB 13.7    HCT 42.4    MCV 87.8    PLT 389       Chemistry      Component Value Date/Time   NA 139 03/16/2017 1428   NA 141 02/17/2015 1249   K 4.0 03/16/2017 1428   K 4.3 02/17/2015 1249   CL 106 03/16/2017 1428   CL 108 (H) 08/21/2012 1501   CO2 27 03/16/2017 1428   CO2 25 02/17/2015 1249   BUN 12 03/16/2017 1428   BUN 16.6 02/17/2015 1249   CREATININE 0.81 03/16/2017 1428   CREATININE 0.8 02/17/2015 1249      Component Value Date/Time   CALCIUM 8.8 (L) 03/16/2017 1428   CALCIUM 9.3 02/17/2015 1249   ALKPHOS 68 11/17/2016 0557   ALKPHOS 77 02/17/2015 1249   AST 37 11/17/2016 0557   AST 17  02/17/2015 1249   ALT 26 11/17/2016 0557   ALT 23 02/17/2015 1249   BILITOT 0.7 11/17/2016 0557   BILITOT 0.38 02/17/2015 1249       Lab Results  Component  Value Date   LABCA2 29 08/07/2011    No components found for: [id number]  No results for input(s): INR in the last 168 hours.  Urinalysis    Component Value Date/Time   COLORURINE YELLOW 03/23/2013 0248   APPEARANCEUR CLEAR 03/23/2013 0248   LABSPEC 1.019 03/23/2013 0248   LABSPEC 1.030 03/08/2009 1016   PHURINE 6.0 03/23/2013 0248   GLUCOSEU NEGATIVE 03/23/2013 0248   GLUCOSEU NEGATIVE 09/07/2009 0752   HGBUR NEGATIVE 03/23/2013 0248   HGBUR negative 11/09/2010 0958   BILIRUBINUR NEGATIVE 03/23/2013 0248   BILIRUBINUR Negative 03/08/2009 1016   KETONESUR NEGATIVE 03/23/2013 0248   PROTEINUR NEGATIVE 03/23/2013 0248   UROBILINOGEN 0.2 03/23/2013 0248   NITRITE NEGATIVE 03/23/2013 0248   LEUKOCYTESUR NEGATIVE 03/23/2013 0248   LEUKOCYTESUR Negative 03/08/2009 1016   STUDIES. 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  ASSESSMENT: 71 y.o. [place] woman: history of right breast cancer: pT1c pN0, stage IA, estrogen and progesterone receptor positive 1. S/p right mastectomy with myocutaneous flap 09/19/2009  2. Adjuvant chemo with 4 cycles of  Taxotere/  Cytoxan  3. Femara, followed by Arimidex, started roughly Jan 2011, with a few months' interruption  4.  Completed 5 years of antiestrogen therapy June 2016  5.  Also s/p left breast lumpectomy in may 2014 ,negative for atypia or malignancy  6. Intermittent thrombocytosis, mild likely multifocal factorial (borderline iron deficiency, positive ANA and elevated C-reactive protein)   PLAN:  I spent approximately 30 minutes with [name] going over her situation. As far as the counts ago, her thrombocytosis is mild and intermittent. She has a borderline low serum ferritin. Recall this rises with inflammation and she has an elevated CRP. Accordingly she is borderline iron deficient and that is a very common reason for thrombocytosis especially  mild thrombocytosis like hers.  Accordingly I suggested she start iron supplementation with 1 tablet of ferrous sulfate daily. If she has any side effects from this she will let us know.  She does have a positive ANA and an elevated CRP. This indicates a mild autoimmune problem. This also can result in a mild rise in the platelet count. She has been evaluated by rheumatology before. They do not feel there is a diagnosis here to be made area however this likely accounts for her pain in her hands and the rash in her arms. It could be that further rheumatologic evaluation at some point in the future may be useful.  When we are able to obtain the results of the breast MRI would review that but I am not anticipating any bad news.  At this point from a breast cancer and thermal cytosis point of view I feel she is fine. She is interested in participating in our survivorship program and I have made her return appointment with our survivorship nurse practitioner for February 2019. She will have lab work including a CRP a week before that visit.  I have not made any further appointments for [name] with me here, but I will be glad to see her at any point in the future if on when the need arises.  Chauncey Cruel, MD  Medical oncology   06/21/2017   This document serves as  a record of services personally performed by Chauncey Cruel, MD. It was created on her behalf by Margit Banda, a trained medical scribe. The creation of this record is based on the scribe's personal observations and the provider's statements to them. This document has been checked and approved by the attending provider.

## 2017-06-22 DIAGNOSIS — J449 Chronic obstructive pulmonary disease, unspecified: Secondary | ICD-10-CM | POA: Diagnosis not present

## 2017-06-24 ENCOUNTER — Other Ambulatory Visit: Payer: Self-pay | Admitting: Oncology

## 2017-06-24 NOTE — Progress Notes (Unsigned)
Addendum: JAK2 mutation negative

## 2017-06-26 ENCOUNTER — Ambulatory Visit
Admission: RE | Admit: 2017-06-26 | Discharge: 2017-06-26 | Disposition: A | Discharge status: Home / Self Care | Payer: Medicare Other | Source: Ambulatory Visit | Attending: Oncology | Admitting: Oncology

## 2017-06-26 DIAGNOSIS — Z853 Personal history of malignant neoplasm of breast: Secondary | ICD-10-CM

## 2017-06-26 DIAGNOSIS — N6489 Other specified disorders of breast: Secondary | ICD-10-CM | POA: Diagnosis not present

## 2017-06-26 MED ORDER — GADOBENATE DIMEGLUMINE 529 MG/ML IV SOLN
14.0000 mL | Freq: Once | INTRAVENOUS | Status: AC | PRN
  Administered 2017-06-26: 14 mL via INTRAVENOUS

## 2017-06-27 ENCOUNTER — Other Ambulatory Visit: Payer: Self-pay | Admitting: Oncology

## 2017-06-27 NOTE — Progress Notes (Unsigned)
Addendum: JAK2 mutation negative ID: Christy HEINRICHS   DOB: 1945-12-02  MR#: [id number]  ZDG#:[id number]  PCP: Hoyt Koch, MD GYN:  SU:  OTHER MD:  Chief complaint: followup visit for breast cancer  Current treatment: Observation  New chief complaint: THROMBOCYTOSIS  [name] had "graduated" at her last visit here, 2 years ago. She continued breast cancer screening through her primary care physician with yearly mammography. On 05/01/2017 left mammography at [facility] was unremarkable. Nevertheless she is very concerned because she has "dense breasts" (category C) and a strong family history.   However lab work showed mild thrombocytosis, with a platelet count 11/16/2016 being 410,000. Repeat the next day was 391,000, but repeat on 03/16/2017 was 443,000. She was referred back for further evaluation and treatment of this problem.  In addition, in early June she was noted to have a right leg swelling. She was evaluated with Doppler ultrasonography which showed no acute DVT in the right lower extremity and no Baker's cyst (03/16/2017). CT angiography on the same day showed no pulmonary embolus. 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  INTERVAL HISTORY: [name] returns today to review the results of her workup for thrombocytosis. At the last visit we obtained a set of labs. The first thing to notice is that her thrombocytosis is minimal and intermittent. She had platelet counts of 410 on 11/16/2016 but 391 on 11/17/2016. On 03/16/2017 the platelet count was 443, but on  it was 389.  Among the many possible reasons for mild intermittent thrombocytosis, iron deficiency is the most common. She has a ferritin of 21 which is technically in the normal range but it certainly on the low side.  She also had an elevated C-reactive protein at 6.1,  although her sedimentation rate was normal at 20 in this lab. Importantly she had a positive ANA titer of 1:160. Rheumatoid factor was negative and labs for Sjogren's were likewise noncontributory.  She is here today to discuss those results  Finally we had set her up for breast MRI but for technical reasons those images did not process. She is scheduled for repeat 06/26/2017   REVIEW OF SYSTEMS: She has arthritis particularly involving her hands. She continues to have problems with a rash, which has been variably evaluated by dermatology both here and at [facility], and by rheumatology. She denies fevers, pruritus, bleeding, unusual headaches, visual changes, nausea, vomiting, cough, phlegm production, or pleurisy. A detailed review of systems was otherwise stable.  HISTORY OF BREAST CANCER:  Remote T1c. 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 She did not require any chest wall radiation. She was on Femara for a short while before changing to Arimidex.    PAST MEDICAL HISTORY: Past Medical History:  Diagnosis Date  . Cancer ([place]) 2010   invasive ductal breast cancer; on tamoxifen  . Complication of anesthesia    was in ICU after lung surgery 2012  . COPD (chronic obstructive pulmonary disease) ([place])   . Diverticulitis    required colcectomy  . Emphysema of lung ([place])   . Fibromyalgia 1995  . GERD (gastroesophageal reflux disease)   . Hearing loss  of both ears    uses hearing aids  bilaterally  . Hx of colonic polyps    Dr. Cristina Gong -last study '11  . Hyperlipidemia   . Hypertension   . Hypothyroidism   . Osteoporosis   . Peripheral vascular disease [facility]) May '12 - CT angio   SMA chronic occlusion; stenosis of celiac trunk.  . Shortness of  breath dyspnea   . Varicose veins of leg with swelling    varicose vein surgery - Dr. Aleda Grana  . Wears glasses     PAST SURGICAL HISTORY: Past Surgical History:  Procedure Laterality Date  . ABDOMINAL HYSTERECTOMY  1973   metorrhagia  . APPENDECTOMY    . BREAST SURGERY  2010   mastectomy with reconstructive surgery  . BREAST SURGERY  2010   rt lump x2  . BREAST SURGERY  2010   partial rt br reduction  . CARDIAC CATHETERIZATION  '11   no obstructive coronary disease  . COLECTOMY  2002   sigmoid  . COLONOSCOPY W/ POLYPECTOMY    . LUNG SURGERY  9/12   rt mid lung wedge  . PARTIAL MASTECTOMY WITH NEEDLE LOCALIZATION Left 02/23/2013   Procedure:  LEFT PARTIAL MASTECTOMY WITH NEEDLE LOCALIZATION;  Surgeon: Adin Hector, MD;  Location: [facility];  Service: General;  Laterality: Left;  . [place]   right upper lobe of lung as part of VAT-nodule/benign     FAMILY HISTORY Family History  Problem Relation Age of Onset  . Colon cancer Mother        colon  . COPD Mother        brown lung  . Hypertension Mother   . Diabetes Mother   . Emphysema Mother   . Coronary artery disease Father   . Heart attack Father   . Sudden death Father   . Heart disease Father   . Cancer Sister        fallopian tube  . Hypertension Sister   . Coronary artery disease Brother   . Throat cancer Sister   . Melanoma Sister   . Hypertension Sister   . Pancreatic cancer Sister   . Cancer Sister   . Heart attack Brother        early 82's  . Heart failure Sister     HEALTH MAINTENANCE: Social History  Substance Use Topics  . Smoking status: Former Smoker    Packs/day: 0.75    Years: 50.00    Types: Cigarettes    Quit date: 03/18/2016  . Smokeless tobacco: Never Used  . Alcohol use 6.0 oz/week    10 Cans of beer per week     Comment: occ     Allergies  Allergen Reactions  . Sulfonamide Derivatives Swelling    Swelling in face    Current  Outpatient Prescriptions  Medication Sig Dispense Refill  . ADVAIR DISKUS 100-50 MCG/DOSE AEPB INHALE 1 PUFF INTO THE LUNGS 2 TIMES DAILY. 180 each 1  . albuterol (PROVENTIL HFA;VENTOLIN HFA) 108 (90 Base) MCG/ACT inhaler Inhale 2 puffs into the lungs every 4 (four) hours as needed for wheezing. As a RESCUE medication 1 Inhaler 1  . amitriptyline (ELAVIL) 50 MG tablet TAKE 1 TABLET (50 MG TOTAL) BY MOUTH AT BEDTIME. 90 tablet 3  . amLODipine (NORVASC) 5 MG tablet Take 1 tablet (5 mg total) by mouth daily. 90 tablet 1  . aspirin 81 MG tablet Take 81 mg by mouth every evening.     Marland Kitchen  atorvastatin (LIPITOR) 80 MG tablet TAKE 1 TABLET EVERY DAY. PLEASE KEEP UPCOMING APPT.THANK YOU 90 tablet 3  . bisacodyl (DULCOLAX) 5 MG EC tablet Take 1 tablet (5 mg total) by mouth daily as needed for moderate constipation. (Patient taking differently: Take 5 mg by mouth daily. ) 30 tablet 11  . buPROPion (WELLBUTRIN SR) 150 MG 12 hr tablet Take 1 tablet (150 mg total) by mouth 2 (two) times daily. 180 tablet 1  . cilostazol (PLETAL) 100 MG tablet Take 1 tablet (100 mg total) by mouth 2 (two) times daily. (Patient taking differently: Take 100 mg by mouth daily. ) 180 tablet 1  . diclofenac (VOLTAREN) 75 MG EC tablet TAKE ONE TABLET BY MOUTH TWICE DAILY 60 tablet 7  . fluticasone (FLONASE) 50 MCG/ACT nasal spray USE 2 SPRAYS IN BOTH NOSTRILS DAILY. 48 g 3  . guaiFENesin (MUCINEX) 600 MG 12 hr tablet Take 600 mg by mouth 2 (two) times daily as needed.     Marland Kitchen ipratropium-albuterol (DUONEB) 0.5-2.5 (3) MG/3ML SOLN USE 1 NEB 3 TIMES DAILY FOR 3 DAYS, THEN EVERY 6 HOURS AS NEEDED. 360 mL 0  . levothyroxine (SYNTHROID, LEVOTHROID) 112 MCG tablet TAKE ONE TABLET BY MOUTH ONE TIME DAILY 90 tablet 3  . LORazepam (ATIVAN) 2 MG tablet TAKE 1 TABLET AT BEDTIME FOR ANXIETY. 30 tablet 0  . metoprolol tartrate (LOPRESSOR) 25 MG tablet TAKE 1 TABLET BY MOUTH 2 TIMES DAILY. (Patient taking differently: 1qdpm) 180 tablet 3  . omeprazole  (PRILOSEC) 40 MG capsule TAKE 1 CAPSULE (40 MG TOTAL) BY MOUTH 2 (TWO) TIMES DAILY. (Patient taking differently: Take 40 mg by mouth daily. ) 180 capsule 3  . OXYGEN Inhale 2 L into the lungs at bedtime.     No current facility-administered medications for this visit.     OBJECTIVE:  Middle-aged white woman In no acute distress  There were no vitals taken for this visit.    ECOG: 1  Sclerae unicteric, EOMs intact Oropharynx clear and moist No cervical or supraclavicular adenopathy Lungs no rales or rhonchi Heart regular rate and rhythm Abd soft, nontender, positive bowel sounds MSK no focal spinal tenderness, no upper extremity lymphedema Neuro: nonfocal, well oriented, appropriate affect Breasts: The right breast is status post mastectomy followed by reconstruction. There is no evidence of local recurrence. The left breast is status post reduction mammoplasty. Both axillae are benign.   LAB RESULTS: Lab Results  Component Value Date   WBC 7.2    NEUTROABS 4.8    HGB 13.7    HCT 42.4    MCV 87.8    PLT 389       Chemistry      Component Value Date/Time   NA 139 03/16/2017 1428   NA 141 02/17/2015 1249   K 4.0 03/16/2017 1428   K 4.3 02/17/2015 1249   CL 106 03/16/2017 1428   CL 108 (H) 08/21/2012 1501   CO2 27 03/16/2017 1428   CO2 25 02/17/2015 1249   BUN 12 03/16/2017 1428   BUN 16.6 02/17/2015 1249   CREATININE 0.81 03/16/2017 1428   CREATININE 0.8 02/17/2015 1249      Component Value Date/Time   CALCIUM 8.8 (L) 03/16/2017 1428   CALCIUM 9.3 02/17/2015 1249   ALKPHOS 68 11/17/2016 0557   ALKPHOS 77 02/17/2015 1249   AST 37 11/17/2016 0557   AST 17 02/17/2015 1249   ALT 26 11/17/2016 0557   ALT 23 02/17/2015 1249   BILITOT 0.7 11/17/2016  0557   BILITOT 0.38 02/17/2015 1249       Lab Results  Component Value Date   LABCA2 29 08/07/2011    No components found for: [id number]  No results for  input(s): INR in the last 168 hours.  Urinalysis    Component Value Date/Time   COLORURINE YELLOW 03/23/2013 0248   APPEARANCEUR CLEAR 03/23/2013 0248   LABSPEC 1.019 03/23/2013 0248   LABSPEC 1.030 03/08/2009 1016   PHURINE 6.0 03/23/2013 0248   GLUCOSEU NEGATIVE 03/23/2013 0248   GLUCOSEU NEGATIVE 09/07/2009 0752   HGBUR NEGATIVE 03/23/2013 0248   HGBUR negative 11/09/2010 0958   BILIRUBINUR NEGATIVE 03/23/2013 0248   BILIRUBINUR Negative 03/08/2009 1016   KETONESUR NEGATIVE 03/23/2013 0248   PROTEINUR NEGATIVE 03/23/2013 0248   UROBILINOGEN 0.2 03/23/2013 0248   NITRITE NEGATIVE 03/23/2013 0248   LEUKOCYTESUR NEGATIVE 03/23/2013 0248   LEUKOCYTESUR Negative 03/08/2009 1016   STUDIES. 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  ASSESSMENT: 71 y.o. [name] woman: history of right breast cancer: pT1c pN0, stage IA, estrogen and progesterone receptor positive 1. S/p right mastectomy with myocutaneous flap 09/19/2009  2. Adjuvant chemo with 4 cycles of  Taxotere/  Cytoxan  3. Femara, followed by Arimidex, started roughly Jan 2011, with a few months' interruption  4.  Completed 5 years of antiestrogen therapy June 2016  5.  Also s/p left breast lumpectomy in may 2014 ,negative for atypia or malignancy  6. Intermittent thrombocytosis, mild likely multifocal factorial (borderline iron deficiency, positive ANA and elevated C-reactive protein)   PLAN:  I spent approximately 30 minutes with [name] going over her situation. As far as the counts ago, her thrombocytosis is mild and intermittent. She has a borderline low serum ferritin. Recall this rises with inflammation and she has an elevated CRP. Accordingly she is borderline iron deficient and that is a very common reason for thrombocytosis especially mild thrombocytosis like hers.  Accordingly I suggested she start iron supplementation with 1  tablet of ferrous sulfate daily. If she has any side effects from this she will let us know.  She does have a positive ANA and an elevated CRP. This indicates a mild autoimmune problem. This also can result in a mild rise in the platelet count. She has been evaluated by rheumatology before. They do not feel there is a diagnosis here to be made area however this likely accounts for her pain in her hands and the rash in her arms. It could be that further rheumatologic evaluation at some point in the future may be useful.  When we are able to obtain the results of the breast MRI would review that but I am not anticipating any bad news.  At this point from a breast cancer and thermal cytosis point of view I feel she is fine. She is interested in participating in our survivorship program and I have made her return appointment with our survivorship nurse practitioner for February 2019. She will have lab work including a CRP a week before that visit.  I have not made any further appointments for [name] with me here, but I will be glad to see her at any point in the future if on when the need arises.  Chauncey Cruel, MD  Medical oncology   06/27/2017   This document serves as a record of services personally performed by Chauncey Cruel, MD. It was created on her behalf by Anderson Malta  Alben Spittle, a trained medical scribe. The creation of this record is based on the scribe's personal observations and the provider's statements to them. This document has been checked and approved by the attending provider.

## 2017-07-04 ENCOUNTER — Other Ambulatory Visit: Payer: Self-pay | Admitting: Internal Medicine

## 2017-07-13 ENCOUNTER — Other Ambulatory Visit: Payer: Self-pay | Admitting: Internal Medicine

## 2017-07-15 ENCOUNTER — Other Ambulatory Visit: Payer: Self-pay

## 2017-07-15 ENCOUNTER — Encounter: Payer: Self-pay | Admitting: Acute Care

## 2017-07-15 ENCOUNTER — Ambulatory Visit (INDEPENDENT_AMBULATORY_CARE_PROVIDER_SITE_OTHER)
Admission: RE | Admit: 2017-07-15 | Discharge: 2017-07-15 | Disposition: A | Discharge status: Home / Self Care | Payer: Medicare Other | Source: Ambulatory Visit | Attending: Acute Care | Admitting: Acute Care

## 2017-07-15 ENCOUNTER — Ambulatory Visit (INDEPENDENT_AMBULATORY_CARE_PROVIDER_SITE_OTHER): Payer: Medicare Other | Admitting: Acute Care

## 2017-07-15 VITALS — BP 100/64 | HR 84 | Ht 66.0 in | Wt 153.4 lb

## 2017-07-15 DIAGNOSIS — R0602 Shortness of breath: Secondary | ICD-10-CM | POA: Diagnosis not present

## 2017-07-15 DIAGNOSIS — R059 Cough, unspecified: Secondary | ICD-10-CM

## 2017-07-15 DIAGNOSIS — J449 Chronic obstructive pulmonary disease, unspecified: Secondary | ICD-10-CM

## 2017-07-15 DIAGNOSIS — R05 Cough: Secondary | ICD-10-CM

## 2017-07-15 DIAGNOSIS — J9621 Acute and chronic respiratory failure with hypoxia: Secondary | ICD-10-CM | POA: Diagnosis not present

## 2017-07-15 MED ORDER — DOXYCYCLINE HYCLATE 100 MG PO TABS: 100.0000 mg | ORAL_TABLET | Freq: Two times a day (BID) | ORAL | 0 refills | Status: DC

## 2017-07-15 MED ORDER — LEVOTHYROXINE SODIUM 112 MCG PO TABS: 112.0000 ug | ORAL_TABLET | Freq: Every day | ORAL | 0 refills | Status: DC

## 2017-07-15 MED ORDER — HYDROCODONE-HOMATROPINE 5-1.5 MG/5ML PO SYRP: 5.0000 mL | ORAL_SOLUTION | Freq: Four times a day (QID) | ORAL | 0 refills | Status: DC | PRN

## 2017-07-15 MED ORDER — PREDNISONE 10 MG PO TABS: ORAL_TABLET | ORAL | 0 refills | Status: DC

## 2017-07-15 NOTE — Assessment & Plan Note (Signed)
Flare Plan: CXR now ( order as stat) We will call with results Xopenex treatment now Doxycycline 100 mg twice daily x 1 week. Prednisone taper; 10 mg tablets: 4 tabs x 2 days, 3 tabs x 2 days, 2 tabs x 2 days 1 tab x 2 days then stop. Use Trelegy sample once daily for 2 weeks. Continue  Advair  daily  as before once Trelegy is gone. Continue Spiriva daily. Remember to rinse mouth after use. Hydromet cough syrup for bedtime. Take 1 teaspoon at bedtime . Do not drive if sleepy. Continue your mucinex as you have been doing. Continue Flonase twice daily. We will give you paperwork for assistance with Trelegy while in [place]ut [place]. Follow up in 2 weeks with Judson Roch NP or Dr. Chase Caller. Continue wearing oxygen 2 l [place] while sick. Saturation goals are 88-92%. Continue your breathing treatments as needed for breakthrough shortness of breath. We will discuss adding Daliresp to decrease the number of flares you are having at follow up. Will need follow up CT chest in November. Will need Flu shot at follow up.

## 2017-07-15 NOTE — Patient Instructions (Addendum)
It is nice to see you today. CXR now ( order as stat) We will call with results Xopenex treatment now Doxycycline 100 mg twice daily x 1 week. Prednisone taper; 10 mg tablets: 4 tabs x 2 days, 3 tabs x 2 days, 2 tabs x 2 days 1 tab x 2 days then stop. Use Trelegy sample once daily for 2 weeks. Continue  Advair  daily  as before once Trelegy is gone. Continue Spiriva daily. Remember to rinse mouth after use. Hydromet cough syrup for bedtime. Take 1 teaspoon at bedtime . Do not drive if sleepy. Continue your mucinex as you have been doing. Continue Flonase twice daily. We will give you paperwork for assistance with Trelegy while in donut [place]. Follow up in 2 weeks with Judson Roch NP or Dr. Chase Caller. Continue wearing oxygen 2 l [place] while sick. Saturation goals are 88-92%. Continue your breathing treatments as needed for breakthrough shortness of breath. We will discuss adding Daliresp to decrease the number of flares you are having at follow up. Will need follow up CT chest in November. Will need Flu shot at follow up.

## 2017-07-15 NOTE — Assessment & Plan Note (Signed)
Wears nocturnal oxygen for nocturnal hypoxemia: Having desaturations daily with flare ( was 86% in the office today) Plan: Wear oxygen at 2 L [place] to maintain saturations 88-92% during the day and at night. If you find sats at rest are > 88% on RA, ok to go to RA at rest. Follow up in 2 weeks Please contact office for sooner follow up if symptoms do not improve or worsen or seek emergency care

## 2017-07-15 NOTE — Progress Notes (Signed)
History of Present Illness [name] is a 71 y.o. female former smoker with COPD Gold III and history of breast cancer.She is followed by Dr. Chase Caller.   07/15/2017 Acute OV: Pt. Presents for acute visit with a 2 week history of worsening shortness of breath, cough, increased wheezing. She endorses tightness in her chest. She is coughing up clear foamy white secretions. No significant increase in amount of secretions.She states she has not had a fever. She is compliant with her Advair and Spiriva daily. She prefers Trelegy, but cannot afford it. She is compliant with her Duonebs,  Mucinex, Flonase and has had increased use in frequency of need for rescue inhaler. She is currently on oxygen nocturnally. She has been checking her saturations, and has found that her saturations during the day have dropped as low as the 70's. She was at 86% today. We have placed her on oxygen at 2 L . She has rebounded to 92%.She denies fever, chest pain, or hemoptysis. She states she has been sleeping propped on 2 pillows for months.  Test Results:  CBC Latest Ref Rng & Units  03/16/2017 11/17/2016  WBC 3.9 - 10.3 10e3/uL 7.2 11.0(H) 5.9  Hemoglobin 11.6 - 15.9 g/dL 13.7 14.3 11.5(L)  Hematocrit 34.8 - 46.6 % 42.4 42.4 35.2(L)  Platelets 145 - 400 10e3/uL 389 443(H) 391    BMP Latest Ref Rng & Units 03/16/2017 11/17/2016 11/16/2016  Glucose 65 - 99 mg/dL 91 163(H) -  BUN 6 - 20 mg/dL 12 24(H) -  Creatinine 0.44 - 1.00 mg/dL 0.81 0.93 0.89  Sodium 135 - 145 mmol/L 139 137 -  Potassium 3.5 - 5.1 mmol/L 4.0 4.3 -  Chloride 101 - 111 mmol/L 106 104 -  CO2 22 - 32 mmol/L 27 24 -  Calcium 8.9 - 10.3 mg/dL 8.8(L) 8.9 -    BNP    Component Value Date/Time   BNP 28.4 07/02/2016 1235    ProBNP No results found for: PROBNP  PFT    Component Value Date/Time   FEV1PRE 0.84 10/26/2016 0704   FEV1POST 1.03 10/26/2016 0704   FVCPRE 1.75 10/26/2016 0704   FVCPOST 2.47 10/26/2016 0704   TLC 6.19  08/16/2016 1435   DLCOUNC 14.10 08/16/2016 1435   PREFEV1FVCRT 48 10/26/2016 0704   PSTFEV1FVCRT 42 10/26/2016 0704    Mr Breast Bilateral W Wo Contrast  Result Date: 06/26/2017 CLINICAL DATA:  71 year old female with history of right breast cancer post mastectomy January 2010. Prior history of benign left breast biopsy approximately 5 years prior. LABS:  Not applicable EXAM: BILATERAL BREAST MRI WITH AND WITHOUT CONTRAST TECHNIQUE: Multiplanar, multisequence MR images of both breasts were obtained prior to and following the intravenous administration of 14 ml of MultiHance. THREE-DIMENSIONAL MR IMAGE RENDERING ON INDEPENDENT WORKSTATION: Three-dimensional MR images were rendered by post-processing of the original MR data on an independent workstation. The three-dimensional MR images were interpreted, and findings are reported in the following complete MRI report for this study. Three dimensional images were evaluated at the independent DynaCad workstation COMPARISON:  Previous exam(s). FINDINGS: Breast composition: c.  Heterogeneous fibroglandular tissue. Background parenchymal enhancement: Mild Right breast: Prior right mastectomy with implant reconstruction. No suspicious rapidly enhancing masses or abnormal areas enhancement to suggest malignancy. Left breast: No suspicious rapidly enhancing masses or abnormal areas of enhancement in the left breast to suggest malignancy. Lymph nodes: No more morphologically abnormal axillary lymph nodes. No internal mammary lymphadenopathy. Ancillary findings:  None. IMPRESSION: No MRI evidence of  malignancy in either breast. RECOMMENDATION: Recommend annual routine screening mammography of the left breast. BI-RADS CATEGORY  2: Benign. Electronically Signed   By: Everlean Alstrom M.D.   On: 06/26/2017 12:48     Past medical hx Past Medical History:  Diagnosis Date  . Cancer ([place]) 2010   invasive ductal breast cancer; on tamoxifen  . Complication of anesthesia      was in ICU after lung surgery 2012  . COPD (chronic obstructive pulmonary disease) ([place])   . Diverticulitis    required colcectomy  . Emphysema of lung ([place])   . Fibromyalgia 1995  . GERD (gastroesophageal reflux disease)   . Hearing loss of both ears    uses hearing aids  bilaterally  . Hx of colonic polyps    Dr. Cristina Gong -last study '11  . Hyperlipidemia   . Hypertension   . Hypothyroidism   . Osteoporosis   . Peripheral vascular disease [facility]) May '12 - CT angio   SMA chronic occlusion; stenosis of celiac trunk.  . Shortness of breath dyspnea   . Varicose veins of leg with swelling    varicose vein surgery - Dr. Aleda Grana  . Wears glasses      Social History  Substance Use Topics  . Smoking status: Former Smoker    Packs/day: 0.75    Years: 50.00    Types: Cigarettes    Quit date: 03/18/2016  . Smokeless tobacco: Never Used  . Alcohol use 6.0 oz/week    10 Cans of beer per week     Comment: occ    [name] reports that she quit smoking about 15 months ago. Her smoking use included Cigarettes. She has a 37.50 pack-year smoking history. She has never used smokeless tobacco. She reports that she drinks about 6.0 oz of alcohol per week . She reports that she does not use drugs.  Tobacco Cessation: Former smoker, quit 03/18/2016 with a 37.5 pack year smoking history.   Past surgical hx, Family hx, Social hx all reviewed.  Current Outpatient Prescriptions on File Prior to Visit  Medication Sig  . ADVAIR DISKUS 100-50 MCG/DOSE AEPB INHALE 1 PUFF INTO THE LUNGS 2 TIMES DAILY.  Marland Kitchen albuterol (PROVENTIL HFA;VENTOLIN HFA) 108 (90 Base) MCG/ACT inhaler Inhale 2 puffs into the lungs every 4 (four) hours as needed for wheezing. As a RESCUE medication  . amitriptyline (ELAVIL) 50 MG tablet TAKE 1 TABLET (50 MG TOTAL) BY MOUTH AT BEDTIME.  Marland Kitchen amLODipine (NORVASC) 5 MG tablet Take 1 tablet (5 mg total) by mouth daily.  Marland Kitchen aspirin 81 MG tablet Take 81 mg by mouth every evening.   Marland Kitchen  atorvastatin (LIPITOR) 80 MG tablet TAKE 1 TABLET EVERY DAY. PLEASE KEEP UPCOMING APPT.THANK YOU  . bisacodyl (DULCOLAX) 5 MG EC tablet Take 1 tablet (5 mg total) by mouth daily as needed for moderate constipation. (Patient taking differently: Take 5 mg by mouth daily. )  . buPROPion (WELLBUTRIN SR) 150 MG 12 hr tablet Take 1 tablet (150 mg total) by mouth 2 (two) times daily.  . cilostazol (PLETAL) 100 MG tablet Take 1 tablet (100 mg total) by mouth 2 (two) times daily. (Patient taking differently: Take 100 mg by mouth daily. )  . diclofenac (VOLTAREN) 75 MG EC tablet TAKE ONE TABLET BY MOUTH TWICE DAILY  . fluticasone (FLONASE) 50 MCG/ACT nasal spray USE 2 SPRAYS IN BOTH NOSTRILS DAILY.  Marland Kitchen guaiFENesin (MUCINEX) 600 MG 12 hr tablet Take 600 mg by mouth 2 (two) times daily as  needed.   Marland Kitchen ipratropium-albuterol (DUONEB) 0.5-2.5 (3) MG/3ML SOLN USE 1 NEB 3 TIMES DAILY FOR 3 DAYS, THEN EVERY 6 HOURS AS NEEDED.  Marland Kitchen levothyroxine (SYNTHROID, LEVOTHROID) 112 MCG tablet TAKE ONE TABLET BY MOUTH ONE TIME DAILY  . levothyroxine (SYNTHROID, LEVOTHROID) 112 MCG tablet Take 1 tablet (112 mcg total) by mouth daily before breakfast. NEED ANNUAL VISIT WITH LABS FOR FURTHER REFILLS  . LORazepam (ATIVAN) 2 MG tablet TAKE 1 TABLET AT BEDTIME FOR ANXIETY.  . metoprolol tartrate (LOPRESSOR) 25 MG tablet TAKE 1 TABLET BY MOUTH 2 TIMES DAILY. (Patient taking differently: 1qdpm)  . omeprazole (PRILOSEC) 40 MG capsule TAKE 1 CAPSULE (40 MG TOTAL) BY MOUTH 2 (TWO) TIMES DAILY. (Patient taking differently: Take 40 mg by mouth daily. )  . OXYGEN Inhale 2 L into the lungs at bedtime.   No current facility-administered medications on file prior to visit.      Allergies  Allergen Reactions  . Sulfonamide Derivatives Swelling    Swelling in face    Review Of Systems:  Constitutional:   No  weight loss, night sweats,  Fevers, chills, fatigue, or  lassitude.  HEENT:   No headaches,  Difficulty swallowing,  Tooth/dental  problems, or  Sore throat,                No sneezing, itching, ear ache, nasal congestion, post nasal drip,   CV:  No chest pain,  Orthopnea, PND, swelling in lower extremities, anasarca, dizziness, palpitations, syncope.   GI  No heartburn, indigestion, abdominal pain, nausea, vomiting, diarrhea, change in bowel habits, loss of appetite, bloody stools.   Resp: + shortness of breath with exertion or at rest.  No excess mucus, no productive cough,  + non-productive cough,  No coughing up of blood.  No change in color of mucus.  + wheezing.  No chest wall deformity  Skin: no rash or lesions.  GU: no dysuria, change in color of urine, no urgency or frequency.  No flank pain, no hematuria   MS:  No joint pain or swelling.  No decreased range of motion.  No back pain.  Psych:  No change in mood or affect. No depression or anxiety.  No memory loss.   Vital Signs BP 100/64 (BP Location: Left Arm, Cuff Size: Normal)   Pulse 84   Ht 5\' 6"  (1.676 m)   Wt 153 lb 6.4 oz (69.6 kg)   SpO2 93%   BMI 24.76 kg/m    Physical Exam:  General- No distress,  A&Ox3, pleasant ENT: No sinus tenderness, TM clear, pale nasal mucosa, no oral exudate,+ post nasal drip, no LAN Cardiac: S1, S2, regular rate and rhythm, no murmur Chest: + wheeze/no  rales/ + rhonchi tyhroughout; no accessory muscle use, no nasal flaring, no sternal retractions Abd.: Soft Non-tender, non distended Ext: No clubbing cyanosis, edema Neuro:  normal strength Skin: No rashes, warm and dry Psych: normal mood and behavior   Assessment/Plan  Stage 3 severe COPD by GOLD classification (HCC) Flare Plan: CXR now ( order as stat) We will call with results Xopenex treatment now Doxycycline 100 mg twice daily x 1 week. Prednisone taper; 10 mg tablets: 4 tabs x 2 days, 3 tabs x 2 days, 2 tabs x 2 days 1 tab x 2 days then stop. Use Trelegy sample once daily for 2 weeks. Continue  Advair  daily  as before once Trelegy is  gone. Continue Spiriva daily. Remember to rinse mouth after use. Hydromet cough  syrup for bedtime. Take 1 teaspoon at bedtime . Do not drive if sleepy. Continue your mucinex as you have been doing. Continue Flonase twice daily. We will give you paperwork for assistance with Trelegy while in donut hole. Follow up in 2 weeks with Judson Roch NP or Dr. Chase Caller. Continue wearing oxygen 2 l [place] while sick. Saturation goals are 88-92%. Continue your breathing treatments as needed for breakthrough shortness of breath. We will discuss adding Daliresp to decrease the number of flares you are having at follow up. Will need follow up CT chest in November. Will need Flu shot at follow up.   Acute on chronic respiratory failure with hypoxia (HCC) Wears nocturnal oxygen for nocturnal hypoxemia: Having desaturations daily with flare ( was 86% in the office today) Plan: Wear oxygen at 2 L [place] to maintain saturations 88-92% during the day and at night. If you find sats at rest are > 88% on RA, ok to go to RA at rest. Follow up in 2 weeks Please contact office for sooner follow up if symptoms do not improve or worsen or seek emergency care   Preventative  Will need Flu shot at follow up if better Needs Follow up CT chest 08/2017 Consider Daliresp for better management of flares.   Magdalen Spatz, NP 07/15/2017  1:13 PM

## 2017-07-18 ENCOUNTER — Other Ambulatory Visit: Payer: Self-pay | Admitting: Internal Medicine

## 2017-07-19 ENCOUNTER — Telehealth: Payer: Self-pay | Admitting: Internal Medicine

## 2017-07-20 ENCOUNTER — Other Ambulatory Visit: Payer: Self-pay | Admitting: Internal Medicine

## 2017-07-22 DIAGNOSIS — J449 Chronic obstructive pulmonary disease, unspecified: Secondary | ICD-10-CM | POA: Diagnosis not present

## 2017-07-25 ENCOUNTER — Other Ambulatory Visit: Payer: Self-pay

## 2017-07-25 MED ORDER — AMITRIPTYLINE HCL 50 MG PO TABS: 50.0000 mg | ORAL_TABLET | Freq: Every day | ORAL | 0 refills | Status: DC

## 2017-07-25 NOTE — Telephone Encounter (Signed)
Pharmacy did not received this refill that was sent over on 07/04/2017. Can this be resent?

## 2017-07-25 NOTE — Telephone Encounter (Signed)
Re-sent to pharmacy.

## 2017-07-26 ENCOUNTER — Other Ambulatory Visit: Payer: Self-pay | Admitting: Family

## 2017-07-26 ENCOUNTER — Other Ambulatory Visit: Payer: Self-pay | Admitting: Internal Medicine

## 2017-07-26 ENCOUNTER — Other Ambulatory Visit: Payer: Self-pay | Admitting: Cardiology

## 2017-07-26 DIAGNOSIS — I739 Peripheral vascular disease, unspecified: Secondary | ICD-10-CM

## 2017-07-26 NOTE — Telephone Encounter (Signed)
Spoke with patient to clarify how she is taking this medication as per snapshot and her current med list she reported only taking it qd. Patient stated that she does not always to remember to take the AM dose but she is trying to do better with this since she the medication was prescribed to her as bid. She is aware that I will send the rx in as requested. Patient appreciative for my call.

## 2017-08-05 ENCOUNTER — Ambulatory Visit: Payer: Medicare Other | Admitting: Internal Medicine

## 2017-08-21 ENCOUNTER — Telehealth: Payer: Self-pay | Admitting: Internal Medicine

## 2017-08-21 NOTE — Telephone Encounter (Signed)
Spoke with pt, she states her PCP will not refill her medications and she has done other incidents like stating she threw her results away and repeating labs Dr. Allyson Sabal has already done. She feels she has a lot of health problems and just wants another doctor that can manage everything. She would also like a refill on Synthroid until she can get under another doctor's care. MR please advise.

## 2017-08-21 NOTE — Telephone Encounter (Signed)
Patient called stating that she requested a refill on her thyroid medication with [facility] this morning and received a call stating it had been denied. I explained to her that it was denied because she is due for an appointment. She said "Well I need my thyroid medicine!". I told her that we would need to get her on the schedule and I would send back a request to see if a small supply could be sent in to get her through until she is able to come in for an appointment with Dr Sharlet Salina. She then hung up the phone... Just FYI.

## 2017-08-22 DIAGNOSIS — J449 Chronic obstructive pulmonary disease, unspecified: Secondary | ICD-10-CM | POA: Diagnosis not present

## 2017-08-23 NOTE — Telephone Encounter (Signed)
Please check if PCP is Hoyt Koch, MD - If so Dr Sharlet Salina is within the [facility] practice. So her options are  A) another PCP inside the  [place] - she should ask for change within the practice following the practice procedures    B) or if she wants to go to [facility] - some names she can look  Up are at [facility] (Drs Virgina Jock or Brigitte Pulse) or [facility] Dr Maudie Mercury  or Dr Deland Pretty etc or Leodis Binet  Thanks  Dr. Brand Males, M.D., F.C.C.P Pulmonary and Critical Care Medicine Staff Physician [facility] Pager: [phone], If no answer or between  15:00h - 7:00h: call [phone]  08/23/2017 9:30 AM

## 2017-08-23 NOTE — Telephone Encounter (Signed)
Called pt and advised message from the provider. Pt understood and verbalized understanding. Nothing further is needed.    

## 2017-08-27 ENCOUNTER — Ambulatory Visit: Payer: Medicare Other | Admitting: Nurse Practitioner

## 2017-08-27 ENCOUNTER — Encounter: Payer: Self-pay | Admitting: Nurse Practitioner

## 2017-08-27 VITALS — BP 118/70 | HR 73 | Ht 66.0 in | Wt 158.8 lb

## 2017-08-27 DIAGNOSIS — E7800 Pure hypercholesterolemia, unspecified: Secondary | ICD-10-CM | POA: Diagnosis not present

## 2017-08-27 DIAGNOSIS — R600 Localized edema: Secondary | ICD-10-CM

## 2017-08-27 DIAGNOSIS — I2510 Atherosclerotic heart disease of native coronary artery without angina pectoris: Secondary | ICD-10-CM | POA: Diagnosis not present

## 2017-08-27 DIAGNOSIS — I10 Essential (primary) hypertension: Secondary | ICD-10-CM | POA: Diagnosis not present

## 2017-08-27 DIAGNOSIS — I739 Peripheral vascular disease, unspecified: Secondary | ICD-10-CM | POA: Diagnosis not present

## 2017-08-27 LAB — HEPATIC FUNCTION PANEL
ALT: 24 IU/L (ref 0–32)
AST: 19 IU/L (ref 0–40)
Albumin: 4.4 g/dL (ref 3.5–4.8)
Alkaline Phosphatase: 113 IU/L (ref 39–117)
Bilirubin Total: 0.2 mg/dL (ref 0.0–1.2)
Bilirubin, Direct: 0.08 mg/dL (ref 0.00–0.40)
Total Protein: 7.1 g/dL (ref 6.0–8.5)

## 2017-08-27 LAB — BASIC METABOLIC PANEL
BUN/Creatinine Ratio: 22 (ref 12–28)
BUN: 18 mg/dL (ref 8–27)
CO2: 22 mmol/L (ref 20–29)
Calcium: 9.7 mg/dL (ref 8.7–10.3)
Chloride: 102 mmol/L (ref 96–106)
Creatinine, Ser: 0.83 mg/dL (ref 0.57–1.00)
GFR calc Af Amer: 81 mL/min/{1.73_m2} (ref >59)
GFR calc non Af Amer: 71 mL/min/{1.73_m2} (ref >59)
Glucose: 83 mg/dL (ref 65–99)
Potassium: 5.2 mmol/L (ref 3.5–5.2)
Sodium: 140 mmol/L (ref 134–144)

## 2017-08-27 LAB — LIPID PANEL
Chol/HDL Ratio: 2.2 ratio (ref 0.0–4.4)
Cholesterol, Total: 164 mg/dL (ref 100–199)
HDL: 74 mg/dL (ref >39)
LDL Calculated: 73 mg/dL (ref 0–99)
Triglycerides: 86 mg/dL (ref 0–149)
VLDL Cholesterol Cal: 17 mg/dL (ref 5–40)

## 2017-08-27 LAB — TSH: TSH: 5.11 u[IU]/mL — ABNORMAL HIGH (ref 0.450–4.500)

## 2017-08-27 LAB — CBC
Hematocrit: 40.3 % (ref 34.0–46.6)
Hemoglobin: 13.5 g/dL (ref 11.1–15.9)
MCH: 29.3 pg (ref 26.6–33.0)
MCHC: 33.5 g/dL (ref 31.5–35.7)
MCV: 87 fL (ref 79–97)
Platelets: 480 10*3/uL — ABNORMAL HIGH (ref 150–379)
RBC: 4.61 x10E6/uL (ref 3.77–5.28)
RDW: 17.2 % — ABNORMAL HIGH (ref 12.3–15.4)
WBC: 7 10*3/uL (ref 3.4–10.8)

## 2017-08-27 MED ORDER — LEVOTHYROXINE SODIUM 112 MCG PO TABS: 112.0000 ug | ORAL_TABLET | Freq: Every day | ORAL | 3 refills | Status: DC

## 2017-08-27 NOTE — Patient Instructions (Addendum)
We will be checking the following labs today - BMET, CBC, HPF, Lipids and TSH   Medication Instructions:    Continue with your current medicines.   I have sent in your refill your thyroid medicne    Testing/Procedures To Be Arranged:  N/A  Follow-Up:   See me in 6 months  Need to establish with new PCP - as soon as possible. I will not be able to refill the Synthroid long term.     Other Special Instructions:   N/A    If you need a refill on your cardiac medications before your next appointment, please call your pharmacy.   Call the [place] office at [phone] if you have any questions, problems or concerns.

## 2017-08-27 NOTE — Progress Notes (Signed)
CARDIOLOGY OFFICE NOTE  Date:  08/27/2017    [name] Date of Birth: May 19, 1946 Medical Record #[id number]  PCP:  Hoyt Koch, MD  Cardiologist:  Annabell Howells  Chief Complaint  Patient presents with  . PAD  . Coronary Artery Disease  . Hypertension    Follow up visit - former patient of Dr. Claris Gladden    History of Present Illness: [name] is a 71 y.o. female who presents today for a 5 month check. Former patient of Dr. Claris Gladden. She has elected to see me going forward.   She has a history of smoking, HTN, COPD, right breast cancer, hyperlipidemia, PAD, and mesenteric vascular disease. Patient had been having nocturnal and post-prandial abdominal cramping. She had a mesenteric angiogram done in 5/12 by Dr. Bridgett Larsson, showing occluded celiac and SMA arteries with patent IMA. She was planned for aorto-mesenteric bypass. She was referred for cardiology evaluation prior to surgery. However, it was ultimately decided not to treat her mesenteric disease surgically.   Given her exertional chest tightness and shortness of breath as well as known vascular disease, Dr. Aundra Dubin set her up for left heart cath. This was done in 5/12 and showed a 70-80% ostial stenosis of a small to moderate 2nd diagonal. This was too small to intervene on and not likely to be particularly symptomatic. Lower extremity arterial dopplers showed a severe focal proximal right SFA stenosis with collaterals from the PFA. She had a VATS for a right-sided lung nodule that showed granulomatous inflammation (no cancer). She quit smoking. She saw Dr. Fletcher Anon for PAD evaluation, and it was decided to treat her medically. She tried cilostazol but did not see much difference with its use so stopped it.   I saw her back November of 2017 - she was doing well. Was back on Pletal. Not smoking.   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 Had also been to the ER - had more swelling in the right leg - CT done. Duplex on the right leg was then negative. Had been told to see vascular surgery - I then referred her to Dr. Donzetta Matters - discussed possible aortogram - looks like she did not follow back up there.  At the time of my visit - she had significant rash - saw Rheumatology - not felt to have lupus. Her cardiac status fortunately, felt to be ok. I did want to get her echo updated at last visit - looks like this was never completed however.    Comes in today. Here alone. She feels like she is doing well. No chest pain. Weight is up 5 pounds. She is off her thyroid medicine - says her PCP denied refill - has not been seen there in the past 6 months - does not wish to go back and is asking for new PCP referral.  Breathing is ok. Has had some recent congestion. Getting chest CT later this week for follow up on lung nodule. Her swelling resolved. Her ulcer healed "right up". She says her skin has improved - just a few "blisters that pop up" every now and then. She notes hair loss. She feels like overall, she is doing ok. She notes some abdominal bloating - wonders if she has a hernia.  PMH: 1. Hypothyroidism 2. HTN 3. GERD 4. Hypothyroidism 5. OSA 6. Venous insufficiency 7. 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 The IMA was patent. CTA abdomen (6/14) showed occluded celiac and SMA with distal reconstitution, no changes to suggest bowel infarction or ischemia.  8. Hyperlipidemia 9. COPD: PFTs (6/12) with moderate obstructive airways disease and response to bronchodilator.  10. Hyperlipidemia 11. Fibromyalgia 12. Breast cancer s/p mastectomy and chemotherapy in 2010.  13. CAD: LHC (5/12) with 70-80% ostial stenosis of a small-moderate 2nd  diagonal. This was too small to intervene upon and unlikely to cause significant symptoms.  14. PAD: Peripheral arterial dopplers (5/12) with severe focal proximal right SFA stenosis. There were collaterals from the PFA. Patient has claudication symptoms. Peripheral arterial dopplers in 5/13 were stable compared to 5/12. Peripheral arterial dopplers (8/14) with ABIs 0.71 on right, 1.1 on left (known right SFA occlusion).  15. 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 VATS 8/12 for right upper lobe nodule showed granulomatous material.    Past Medical History:  Diagnosis Date  . Cancer ([place]) 2010   invasive ductal breast cancer; on tamoxifen  . Complication of anesthesia    was in ICU after lung surgery 2012  . COPD (chronic obstructive pulmonary disease) ([place])   . Diverticulitis    required colcectomy  . Emphysema of lung ([place])   . Fibromyalgia 1995  . GERD (gastroesophageal reflux disease)   . Hearing loss of both ears    uses hearing aids  bilaterally  . Hx of colonic polyps    Dr. Cristina Gong -last study '11  . Hyperlipidemia   . Hypertension   . Hypothyroidism   . Osteoporosis   . Peripheral vascular disease [facility]) May '12 - CT angio   SMA chronic occlusion; stenosis of celiac trunk.  . Shortness of breath dyspnea   . Varicose veins of leg with swelling    varicose vein surgery - Dr. Aleda Grana  . Wears glasses     Past Surgical History:  Procedure Laterality Date  . ABDOMINAL HYSTERECTOMY  1973   metorrhagia  . APPENDECTOMY    . BREAST SURGERY  2010   mastectomy with reconstructive surgery  . BREAST SURGERY  2010   rt lump x2  . BREAST SURGERY  2010   partial rt br reduction  . CARDIAC CATHETERIZATION  '11   no obstructive coronary disease  . COLECTOMY  2002   sigmoid  . COLONOSCOPY W/ POLYPECTOMY    . LUNG SURGERY  9/12   rt mid lung wedge  . [place]   right upper lobe of lung as part of  VAT-nodule/benign     Medications: Current Meds  Medication Sig  . ADVAIR DISKUS 100-50 MCG/DOSE AEPB INHALE 1 PUFF INTO THE LUNGS 2 TIMES DAILY.  Marland Kitchen albuterol (PROVENTIL HFA;VENTOLIN HFA) 108 (90 Base) MCG/ACT inhaler Inhale 2 puffs into the lungs every 4 (four) hours as needed for wheezing. As a RESCUE medication  . amitriptyline (ELAVIL) 50 MG tablet Take 1 tablet (50 mg total) by mouth at bedtime.  Marland Kitchen amLODipine (NORVASC) 5 MG tablet Take 1 tablet (5 mg total) by mouth daily.  Marland Kitchen aspirin 81 MG tablet Take 81 mg by mouth every evening.   Marland Kitchen atorvastatin (LIPITOR) 80 MG tablet TAKE 1 TABLET EVERY DAY. PLEASE KEEP UPCOMING APPT.THANK YOU  . bisacodyl (DULCOLAX) 5 MG EC tablet Take 1 tablet (5 mg  total) by mouth daily as needed for moderate constipation. (Patient taking differently: Take 5 mg by mouth daily. )  . buPROPion (WELLBUTRIN SR) 150 MG 12 hr tablet Take 1 tablet (150 mg total) by mouth 2 (two) times daily. Need annual visit for further refills  . cilostazol (PLETAL) 100 MG tablet TAKE 1 TABLET (100 MG TOTAL) BY MOUTH 2 (TWO) TIMES DAILY.  Marland Kitchen diclofenac (VOLTAREN) 75 MG EC tablet TAKE ONE TABLET BY MOUTH TWICE DAILY  . guaiFENesin (MUCINEX) 600 MG 12 hr tablet Take 600 mg by mouth 2 (two) times daily as needed.   Marland Kitchen ipratropium-albuterol (DUONEB) 0.5-2.5 (3) MG/3ML SOLN USE 1 NEB 3 TIMES DAILY FOR 3 DAYS, THEN EVERY 6 HOURS AS NEEDED.  Marland Kitchen LORazepam (ATIVAN) 2 MG tablet TAKE 1 TABLET BY MOUTH AT BEDTIME FOR ANXIETY  . metoprolol tartrate (LOPRESSOR) 25 MG tablet TAKE 1 TABLET BY MOUTH 2 TIMES DAILY. (Patient taking differently: 1qdpm)  . omeprazole (PRILOSEC) 40 MG capsule TAKE 1 CAPSULE (40 MG TOTAL) BY MOUTH 2 (TWO) TIMES DAILY. (Patient taking differently: Take 40 mg by mouth daily. )  . OXYGEN Inhale 2 L into the lungs at bedtime.  . [DISCONTINUED] fluticasone (FLONASE) 50 MCG/ACT nasal spray USE 2 SPRAYS IN BOTH NOSTRILS DAILY.     Allergies: Allergies  Allergen Reactions  .  Sulfonamide Derivatives Swelling    Swelling in face    Social History: The patient  reports that she quit smoking about 17 months ago. Her smoking use included cigarettes. She has a 37.50 pack-year smoking history. she has never used smokeless tobacco. She reports that she drinks about 6.0 oz of alcohol per week. She reports that she does not use drugs.   Family History: The patient's family history includes COPD in her mother; Cancer in her sister and sister; Colon cancer in her mother; Coronary artery disease in her brother and father; Diabetes in her mother; Emphysema in her mother; Heart attack in her brother and father; Heart disease in her father; Heart failure in her sister; Hypertension in her mother, sister, and sister; Melanoma in her sister; Pancreatic cancer in her sister; Sudden death in her father; Throat cancer in her sister.   Review of Systems: Please see the history of present illness.   Otherwise, the review of systems is positive for none.   All other systems are reviewed and negative.   Physical Exam: VS:  BP 118/70 (BP Location: Left Arm, Patient Position: Sitting, Cuff Size: Normal)   Pulse 73   Ht 5\' 6"  (1.676 m)   Wt 158 lb 12.8 oz (72 kg)   SpO2 94%   BMI 25.63 kg/m  .  BMI Body mass index is 25.63 kg/m.  Wt Readings from Last 3 Encounters:  08/27/17 158 lb 12.8 oz (72 kg)  07/15/17 153 lb 6.4 oz (69.6 kg)  06/21/17 154 lb 9.6 oz (70.1 kg)    General: Pleasant. Looks chronically ill but alert and in no acute distress.   HEENT: Normal.  Neck: Supple, no JVD, carotid bruits, or masses noted.  Cardiac: Regular rate and rhythm. No murmurs, rubs, or gallops. No edema.  Respiratory:  Lungs with some wheezes but with normal work of breathing.  GI: Soft and nontender.  MS: No deformity or atrophy. Gait and ROM intact.  Skin: Warm and dry. Color is normal.  Neuro:  Strength and sensation are intact and no gross focal deficits noted.  Psych: Alert, appropriate  and with normal affect.   LABORATORY  DATA:  EKG:  EKG is not ordered today.  Lab Results  Component Value Date   WBC 7.2    HGB 13.7    HCT 42.4    PLT 389    GLUCOSE 91 03/16/2017   CHOL 174 09/03/2016   TRIG 247 (H) 09/03/2016   HDL 73 09/03/2016   LDLDIRECT 88.0 08/18/2013   LDLCALC 52 09/03/2016   ALT 26 11/17/2016   AST 37 11/17/2016   NA 139 03/16/2017   K 4.0 03/16/2017   CL 106 03/16/2017   CREATININE 0.81 03/16/2017   BUN 12 03/16/2017   CO2 27 03/16/2017   TSH 1.83 08/12/2015   INR 0.98 03/23/2013     BNP (last 3 results) No results for input(s): BNP in the last 8760 hours.  ProBNP (last 3 results) No results for input(s): PROBNP in the last 8760 hours.   Other Studies Reviewed Today:  CT ANGIO BIFEM IMPRESSION 03/2017: VASCULAR  Moderate atherosclerotic disease throughout the aorta and common iliac vessels.  Near complete occlusion of the celiac artery and complete occlusion of the proximal SMA. Reconstitution of the SMA through IMA.  Moderate disease throughout the right superficial femoral artery and trifurcation vessels in the right calf. Mild disease within the left posterior tibial artery and proximal anterior tibial artery, otherwise patent left lower extremity runoff.  No visible deep venous thrombosis.  NON-VASCULAR  Moderate-sized hiatal hernia.  No acute findings in the abdomen or pelvis.  Subcutaneous edema/swelling in the right lower extremity from the mid thigh through the calf.   Electronically Signed By: Rolm Baptise M.D. On: 03/16/2017 18:22  Summary:  - No evidence of deep vein thrombosis involving the right lower extremity. - No evidence of Baker&'s cyst on the right.  Other specific details can be found in the table(s) above. Prepared and Electronically Authenticated by  Jessy Oto. Fields MD 2018-06-03T12:09:54   02/27/2017: Vascular US Lower  Extremities Evidence of right lower extremity chronic, non occlusive, deep venous thrombus in one of the paired peroneal veins with incompetency.  No evidence of left lower extremity deep or superficial venous thrombus or incompetence.  Echo Study Conclusions 06/2016  - Left ventricle: The cavity size was normal. Systolic function was normal. The estimated ejection fraction was in the range of 55% to 60%. Doppler parameters are consistent with abnormal left ventricular relaxation (grade 1 diastolic dysfunction). - Mitral valve: Calcified annulus. Mildly thickened leaflets .   Assessment/Plan:  1. Right leg swelling - had prior peroneal DVT noted in May - now resolved.   2. Ulcer - had been referred to [facility] - resolved.   3. PAD: was seen earlier this year at [facility] - no current issues noted.   4. Mesenteric ischemia:  She has been managed medically over the past several years.  She notes some generalized bloating. She is worried that she may have a hernia - I do not really appreciate this on exam today - have asked to try and discuss with PCP once she arranges.   5. CAD: Nonobstructive CAD. No active chest pain.   6. COPD: followed by pulmonary. She has stopped smoking. She is on oxygen at night.  For repeat CT later this month.   7. HTN: BP is ok on her current regimen.   8. General medical care - needs new PCP - list given today.   9. Hypothyroid - I will refill but will not be able to refill long term. She is aware. She needs labs today.  10. HLD - needs labs today.    Current medicines are reviewed with the patient today.  The patient does not have concerns regarding medicines other than what has been noted above.  The following changes have been made:  See above.  Labs/ tests ordered today include:    Orders Placed This Encounter  Procedures  . Basic metabolic panel  . CBC  . TSH  . Lipid panel  . Hepatic function panel     Disposition:   FU  with me in 6 months.   Patient is agreeable to this plan and will call if any problems develop in the interim.   SignedTruitt Merle, NP  08/27/2017 10:46 AM  [facility] [address] [place], [place]  [address] Phone: [phone] Fax: [phone]

## 2017-08-29 ENCOUNTER — Ambulatory Visit (INDEPENDENT_AMBULATORY_CARE_PROVIDER_SITE_OTHER)
Admission: RE | Admit: 2017-08-29 | Discharge: 2017-08-29 | Disposition: A | Discharge status: Home / Self Care | Payer: Medicare Other | Source: Ambulatory Visit | Attending: Adult Health | Admitting: Adult Health

## 2017-08-29 ENCOUNTER — Encounter (INDEPENDENT_AMBULATORY_CARE_PROVIDER_SITE_OTHER): Payer: Self-pay

## 2017-08-29 ENCOUNTER — Telehealth: Payer: Self-pay | Admitting: *Deleted

## 2017-08-29 DIAGNOSIS — J9621 Acute and chronic respiratory failure with hypoxia: Secondary | ICD-10-CM

## 2017-08-29 DIAGNOSIS — R918 Other nonspecific abnormal finding of lung field: Secondary | ICD-10-CM | POA: Diagnosis not present

## 2017-08-29 DIAGNOSIS — J449 Chronic obstructive pulmonary disease, unspecified: Secondary | ICD-10-CM

## 2017-08-29 NOTE — Telephone Encounter (Signed)
Dr. Martinique, okay for patient to transfer to you?

## 2017-08-29 NOTE — Telephone Encounter (Signed)
Copied from [place] [id number]. Topic: Inquiry >> Aug 27, 2017  1:45 PM [name] wrote: Pt would like to switch from Dr Sharlet Salina to Dr Martinique. Pt states she has a lot of health issues and wanted someone else to help with her health. Ok with you Dr Sharlet Salina? [place] with you Dr Martinique?  >> Aug 29, 2017 12:52 PM Hoyt Koch, MD wrote: Fine with me >> Aug 29, 2017  1:19 PM [name] wrote: Dr Sharlet Salina has agreed to allow pt to switch to Dr Martinique. Please follow up and schedule pt.  Thank you!

## 2017-08-30 NOTE — Telephone Encounter (Signed)
Fine with me

## 2017-09-02 NOTE — Telephone Encounter (Signed)
Waiting on reply from Dr. Sharlet Salina.

## 2017-09-03 NOTE — Telephone Encounter (Signed)
Pt has been scheduled 09/09/17 to see Dr. Martinique. Nothing further needed at this time.

## 2017-09-09 ENCOUNTER — Encounter: Payer: Self-pay | Admitting: Internal Medicine

## 2017-09-09 ENCOUNTER — Ambulatory Visit (INDEPENDENT_AMBULATORY_CARE_PROVIDER_SITE_OTHER): Payer: Medicare Other | Admitting: Family Medicine

## 2017-09-09 ENCOUNTER — Encounter: Payer: Self-pay | Admitting: Family Medicine

## 2017-09-09 ENCOUNTER — Telehealth: Payer: Self-pay | Admitting: Internal Medicine

## 2017-09-09 ENCOUNTER — Other Ambulatory Visit: Payer: Self-pay | Admitting: Family Medicine

## 2017-09-09 ENCOUNTER — Ambulatory Visit: Payer: Medicare Other | Admitting: Internal Medicine

## 2017-09-09 VITALS — BP 92/64 | HR 73 | Ht 66.0 in | Wt 160.6 lb

## 2017-09-09 VITALS — BP 100/70 | HR 76 | Temp 97.6°F | Resp 12 | Ht 66.0 in | Wt 162.0 lb

## 2017-09-09 DIAGNOSIS — F419 Anxiety disorder, unspecified: Secondary | ICD-10-CM | POA: Diagnosis not present

## 2017-09-09 DIAGNOSIS — I10 Essential (primary) hypertension: Secondary | ICD-10-CM

## 2017-09-09 DIAGNOSIS — J441 Chronic obstructive pulmonary disease with (acute) exacerbation: Secondary | ICD-10-CM

## 2017-09-09 DIAGNOSIS — M797 Fibromyalgia: Secondary | ICD-10-CM | POA: Diagnosis not present

## 2017-09-09 DIAGNOSIS — E039 Hypothyroidism, unspecified: Secondary | ICD-10-CM | POA: Diagnosis not present

## 2017-09-09 DIAGNOSIS — G4700 Insomnia, unspecified: Secondary | ICD-10-CM | POA: Diagnosis not present

## 2017-09-09 DIAGNOSIS — J449 Chronic obstructive pulmonary disease, unspecified: Secondary | ICD-10-CM | POA: Diagnosis not present

## 2017-09-09 DIAGNOSIS — Z23 Encounter for immunization: Secondary | ICD-10-CM | POA: Diagnosis not present

## 2017-09-09 MED ORDER — LEVOTHYROXINE SODIUM 112 MCG PO TABS: 112.0000 ug | ORAL_TABLET | Freq: Every day | ORAL | 1 refills | Status: DC

## 2017-09-09 MED ORDER — LORAZEPAM 2 MG PO TABS: ORAL_TABLET | ORAL | 0 refills | Status: DC

## 2017-09-09 MED ORDER — TRAZODONE HCL 50 MG PO TABS: 25.0000 mg | ORAL_TABLET | Freq: Every evening | ORAL | 1 refills | Status: DC | PRN

## 2017-09-09 MED ORDER — FLUTICASONE-UMECLIDIN-VILANT 100-62.5-25 MCG/INH IN AEPB: 1.0000 | INHALATION_SPRAY | Freq: Every day | RESPIRATORY_TRACT | 0 refills | Status: DC

## 2017-09-09 MED ORDER — ROFLUMILAST 500 MCG PO TABS: 500.0000 ug | ORAL_TABLET | Freq: Every day | ORAL | 11 refills | Status: DC

## 2017-09-09 NOTE — Patient Instructions (Addendum)
ICD-10-CM   1. Stage 3 severe COPD by GOLD classification ([place]) J44.9   2. COPD, frequent exacerbations ([place]) J44.1    COPD stable I do not think hair loss is related to COPD or other medications for COPD  Plan -Take trilogy samples because there is evidence this could help with recurrent COPD exacerbations and take this as a maintenance -Use albuterol as needed high-dose flu shot today -High-dose flu shot today -Start Daliresp with starter pack to prevent COPD exacerbations    -If you are having any GI side effects that is now; take it with food -In the future we will consider referral to voice rehab because I think you have a component of vocal cord dysfunction  Follow-up 3 months or sooner if needed; CAPD cat score at follow-up

## 2017-09-09 NOTE — Addendum Note (Signed)
Addended by: Lorretta Harp on: 09/09/2017 02:42 PM   Modules accepted: Orders

## 2017-09-09 NOTE — Addendum Note (Signed)
Addended by: Lorretta Harp on: 09/09/2017 04:22 PM   Modules accepted: Orders

## 2017-09-09 NOTE — Progress Notes (Signed)
Subjective:     Patient ID: [name], female   DOB: 1946/02/01, 71 y.o.   MRN: [id number]  HPI \   PCP Christy Koch, MD  OV 12/31/2016  Chief Complaint  Patient presents with  . Follow-up    Pt states her breahting is doing well. Pt denies cough and CP/tightness. Pt c/o acid reflux.      Follow-up Gold stage III COPD  She has recurrent exacerbation. Most recently was over a month ago when she was hospitalized. She currently feels baseline. She feels good. She is not on oxygen therapy. Reviewed her medication list and shows that she is only on Advair as control therapy for COPD. She has no idea why she is not on an anticholinergic. DuoNeb was listed but she says she only takes it sparingly as needed and since her discharge is not needing it. She has normal work and anticholinergic is. She also tells me that because of her eosinophilia and elevated IgE and seasonal allergies wants a referral to allergy clinic. She is seeing [name] for her skin issues and she is feeling better.         Results for Christy, [name] (MRN [id number]) as of 12/31/2016 12:33  Ref. Range 07/04/2016 05:26 07/05/2016 03:23 07/10/2016 05:13 11/16/2016 13:20 11/17/2016 05:57  Eosinophils Absolute Latest Ref Range: 0.0 - 0.7 K/uL    1.6 (H) 0.0    Results for Christy, [name] (MRN [id number]) as of 12/31/2016 12:33  Ref. Range 11/16/2016 13:20  IgE (Immunoglobulin E), Serum Latest Ref Range: 0 - 100 IU/mL 180 (H)    OV 02/26/2017  Chief Complaint  Patient presents with  . Follow-up    FOLLOW UP FOR patient states that she is here for SOB and her voice is horse and she has wheezing all the time patient denies c/p or tightness     Follow-up Gold stage III COPD: She is on triple inhaler therapy with Spiriva and Advair. Has COPD stable. She has elevated IgE and eosinophilia. She has seen allergist and then got referred to Dr. Lucia [name] [name] who apparently treated her for pharyngeal or laryngeal thrush and this has  helped. She also has skin rash. I referred her to [name] and she's tried some prednisone course and has had skin biopsy with these things have not helped. Nevertheless she feels she is under good care. Overall in terms of COPD she stable but she does have significant cough and is a history of frequent exacerbations. But she does not think she is an exacerbation today  The new issue appears to be that she has last few days she has right lower extremity swelling greater than the left. There is some associated warmth    03/07/2017 Follow UP OV for Chronic DVT:  Pt. Was seen by Dr. Chase [name] 02/26/2017 for leg swelling. He was concerned about possibility of acute DVT and ordered a doppler study. The patient did not have any chest pain or shortness of breath at the time. 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 Dr. Chase [name] chose not to treat as  this is a chronic problem. He wanted the patient to be diligent with her ASA daily.The patient returns today for follow up. She continues to have no respiratory issues. Her COPD is stable and at baseline. She does  not have any chest pain, or tachycardia. She states her baseline dyspnea that she experiences with her COPD  is unchanged. She continues to have  issues with bilateral leg swelling.She is not wearing her oxygen with exertion as prescribed . She states she is wearing it at night. We did discuss the additional strain  placed on the heart when oxygen levels fall below 88%, and the importance of compliance with oxygen on exertion. Most recent hospital admission for COPD exacerbation 11/2016. She was prophylaxed with heparin for DVT prophylaxis at that time.    OV 06/04/2017  Chief Complaint  Patient presents with  . Follow-up    Pt states her SOB has recently worsened x 1 week. Pt c/o prod cough with opaque colored mucus. Pt denies CP/tightness and f/c/s.    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 Here with her husband. She initially told me that she was stable and doing well without exacerbations but has a continued to talk to her she initially told me that she's been coughing more than usual for past one day but then has been interjected saying she's been coughing more than usual for a few weeks. She agreed to this. She admitted that the sputum is a little bit more discolored than baseline. She is also more short of breath and wheezing more and coughing more than baseline.  Her recent months have been complicated by chronic DVT finding for which she says she is seeing vascular surgery Dr. Adele [name] and his been placed on observation therapy according to history. She's also had cellulitis issues with her feet. She showed me treatment medication of clindamycin and cephalexin at [facility] and was to hospital in the middle of June 2018. She says for the last few months she's been having excessive hair loss and she's going to see dermatology for this. She's wondering if antibiotic and prednisone and contributing to hair loss. At the same time she is worried about recurrent COPD exacerbation and leaving it untreated.      07/15/2017 Acute OV:  [name] is a 71 y.o. female former smoker with COPD Gold III and history of breast cancer.She is followed by Dr. Chase [name].  Pt. Presents for acute visit with a 2 week history of worsening shortness of breath, cough, increased wheezing. She endorses tightness in her chest. She is coughing up clear foamy white secretions. No significant increase in amount of secretions.She states she has not had a fever. She is compliant with her Advair and Spiriva daily. She prefers Trelegy, but cannot afford it. She is compliant with her Duonebs,  Mucinex, Flonase and has had increased use in frequency of need for rescue inhaler. She is currently on oxygen nocturnally. She has been checking her  saturations, and has found that her saturations during the day have dropped as low as the 70's. She was at 86% today. We have placed her on oxygen at 2 L . She has rebounded to 92%.She denies fever, chest pain, or hemoptysis. She states she has been sleeping propped on 2 pillows for months.  Test Results:   OV 09/09/2017  Chief Complaint  Patient presents with  . Follow-up    Pt states that she has been doing good. States that she started coughing with thick mucus x1 week ago. Also states that she has SOB with exertion. Denies any CP.    Gold stage III COPD with a history of breast cancer.  She has frequent exacerbations.  Last August 2018.  Then after that in October 2018 again had an  exacerbation and so my nurse practitioner.  She tells me that she is taking different inhalers because of cost issues although her best preferred inhalers trelegy..  Since that she has quit smoking.  She is very worried about her hair loss and has an appoint with [name].  She says that she saw a dermatologist at [facility] and was recommended being in a tanning bed for 30 days but this is not helped.  She is looking forward to seeing [name].  Currently feels COPD stable with a COPD CAT score of 17 but she does have mild for secretory wheeze.  She is extremely interested in [place] for prevention of COPD exacerbations.    CAT COPD Symptom & Quality of Life Score (GSK trademark) 0 is no burden. 5 is highest burden 09/09/2017   Never Cough -> Cough all the time 3  No phlegm in chest -> Chest is full of phlegm 3  No chest tightness -> Chest feels very tight 3  No dyspnea for 1 flight stairs/hill -> Very dyspneic for 1 flight of stairs 0  No limitations for ADL at home -> Very limited with ADL at home 0  Confident leaving home -> Not at all confident leaving home 0  Sleep soundly -> Do not sleep soundly because of lung condition 5  Lots of Energy -> No energy at all 3  TOTAL Score (max 40)  17         Results for Christy, [name] (MRN [id number]) as of 06/04/2017 12:27  Ref. Range 08/16/2016 14:35 10/26/2016 07:04  FEV1-Post Latest Units: L 1.37 1.03  FEV1-%Pred-Post Latest Units: % 56 44  FEV1-%Change-Post Latest Units: % 0 22       has a past medical history of Cancer ([place]) (7616), Complication of anesthesia, COPD (chronic obstructive pulmonary disease) ([place]), Diverticulitis, Emphysema of lung ([place]), Fibromyalgia (1995), GERD (gastroesophageal reflux disease), Hearing loss of both ears, colonic polyps, Hyperlipidemia, Hypertension, Hypothyroidism, Osteoporosis, Peripheral vascular disease [facility]) (May '12 - CT angio), Shortness of breath dyspnea, Varicose veins of leg with swelling, and Wears glasses.   reports that she quit smoking about 17 months ago. Her smoking use included cigarettes. She has a 37.50 pack-year smoking history. she has never used smokeless tobacco.  Past Surgical History:  Procedure Laterality Date  . ABDOMINAL HYSTERECTOMY  1973   metorrhagia  . APPENDECTOMY    . BREAST SURGERY  2010   mastectomy with reconstructive surgery  . BREAST SURGERY  2010   rt lump x2  . BREAST SURGERY  2010   partial rt br reduction  . CARDIAC CATHETERIZATION  '11   no obstructive coronary disease  . COLECTOMY  2002   sigmoid  . COLONOSCOPY W/ POLYPECTOMY    . LUNG SURGERY  9/12   rt mid lung wedge  . PARTIAL MASTECTOMY WITH NEEDLE LOCALIZATION Left 02/23/2013   Procedure:  LEFT PARTIAL MASTECTOMY WITH NEEDLE LOCALIZATION;  Surgeon: Adin Hector, MD;  Location: [facility];  Service: General;  Laterality: Left;  . [place]   right upper lobe of lung as part of VAT-nodule/benign    Allergies  Allergen Reactions  . Sulfonamide Derivatives Swelling    Swelling in face    Immunization History  Administered Date(s) Administered  . Influenza,inj,Quad PF,6+ Mos 08/10/2014, 08/12/2015, 07/03/2016  . Pneumococcal Conjugate-13 08/10/2014  .  Pneumococcal Polysaccharide-23 08/12/2015  . Td 10/16/2007  . Zoster 11/09/2010    Family History  Problem Relation  Age of Onset  . Colon cancer Mother        colon  . COPD Mother        brown lung  . Hypertension Mother   . Diabetes Mother   . Emphysema Mother   . Coronary artery disease Father   . Heart attack Father   . Sudden death Father   . Heart disease Father   . Cancer Sister        fallopian tube  . Hypertension Sister   . Coronary artery disease Brother   . Throat cancer Sister   . Melanoma Sister   . Hypertension Sister   . Pancreatic cancer Sister   . Cancer Sister   . Heart attack Brother        early 15's  . Heart failure Sister      Current Outpatient Medications:  .  ADVAIR DISKUS 100-50 MCG/DOSE AEPB, INHALE 1 PUFF INTO THE LUNGS 2 TIMES DAILY., Disp: 180 each, Rfl: 1 .  albuterol (PROVENTIL HFA;VENTOLIN HFA) 108 (90 Base) MCG/ACT inhaler, Inhale 2 puffs into the lungs every 4 (four) hours as needed for wheezing. As a RESCUE medication, Disp: 1 Inhaler, Rfl: 1 .  amitriptyline (ELAVIL) 50 MG tablet, Take 1 tablet (50 mg total) by mouth at bedtime., Disp: 90 tablet, Rfl: 0 .  amLODipine (NORVASC) 5 MG tablet, Take 1 tablet (5 mg total) by mouth daily., Disp: 90 tablet, Rfl: 1 .  aspirin 81 MG tablet, Take 81 mg by mouth every evening. , Disp: , Rfl:  .  atorvastatin (LIPITOR) 80 MG tablet, TAKE 1 TABLET EVERY DAY. PLEASE KEEP UPCOMING APPT.THANK YOU, Disp: 90 tablet, Rfl: 3 .  Biotin 1000 MCG tablet, Take 1,000 mg by mouth daily., Disp: , Rfl:  .  bisacodyl (DULCOLAX) 5 MG EC tablet, Take 1 tablet (5 mg total) by mouth daily as needed for moderate constipation. (Patient taking differently: Take 5 mg by mouth daily. ), Disp: 30 tablet, Rfl: 11 .  buPROPion (WELLBUTRIN SR) 150 MG 12 hr tablet, Take 1 tablet (150 mg total) by mouth 2 (two) times daily. Need annual visit for further refills, Disp: 60 tablet, Rfl: 0 .  cilostazol (PLETAL) 100 MG tablet, TAKE 1  TABLET (100 MG TOTAL) BY MOUTH 2 (TWO) TIMES DAILY., Disp: 180 tablet, Rfl: 2 .  diclofenac (VOLTAREN) 75 MG EC tablet, TAKE ONE TABLET BY MOUTH TWICE DAILY, Disp: 60 tablet, Rfl: 7 .  ferrous sulfate 325 (65 FE) MG EC tablet, Take 325 mg by mouth daily with breakfast., Disp: , Rfl:  .  guaiFENesin (MUCINEX) 600 MG 12 hr tablet, Take 600 mg by mouth 2 (two) times daily as needed. , Disp: , Rfl:  .  ipratropium-albuterol (DUONEB) 0.5-2.5 (3) MG/3ML SOLN, USE 1 NEB 3 TIMES DAILY FOR 3 DAYS, THEN EVERY 6 HOURS AS NEEDED., Disp: 360 mL, Rfl: 0 .  levothyroxine (SYNTHROID) 112 MCG tablet, Take 1 tablet (112 mcg total) daily before breakfast by mouth., Disp: 30 tablet, Rfl: 3 .  LORazepam (ATIVAN) 2 MG tablet, TAKE 1 TABLET BY MOUTH AT BEDTIME FOR ANXIETY, Disp: 30 tablet, Rfl: 2 .  metoprolol tartrate (LOPRESSOR) 25 MG tablet, TAKE 1 TABLET BY MOUTH 2 TIMES DAILY. (Patient taking differently: 1qdpm), Disp: 180 tablet, Rfl: 3 .  omeprazole (PRILOSEC) 40 MG capsule, TAKE 1 CAPSULE (40 MG TOTAL) BY MOUTH 2 (TWO) TIMES DAILY. (Patient taking differently: Take 40 mg by mouth daily. ), Disp: 180 capsule, Rfl: 3 .  OXYGEN, Inhale  2 L into the lungs at bedtime., Disp: , Rfl:    Review of Systems     Objective:   Physical Exam  Constitutional: She is oriented to person, place, and time. She appears well-developed and well-nourished. No distress.  HENT:  Head: Normocephalic and atraumatic.  Right Ear: External ear normal.  Left Ear: External ear normal.  Mouth/Throat: Oropharynx is clear and moist. No oropharyngeal exudate.  Eyes: Conjunctivae and EOM are normal. Pupils are equal, round, and reactive to light. Right eye exhibits no discharge. Left eye exhibits no discharge. No scleral icterus.  Neck: Normal range of motion. Neck supple. No JVD present. No tracheal deviation present. No thyromegaly present.  Cardiovascular: Normal rate, regular rhythm, normal heart sounds and intact distal pulses. Exam  reveals no gallop and no friction rub.  No murmur heard. Pulmonary/Chest: Effort normal and breath sounds normal. No respiratory distress. She has no wheezes. She has no rales. She exhibits no tenderness.  Has mild forced end exp wheeze coming from vocal cords  Abdominal: Soft. Bowel sounds are normal. She exhibits no distension and no mass. There is no tenderness. There is no rebound and no guarding.  Musculoskeletal: Normal range of motion. She exhibits no edema or tenderness.  Lymphadenopathy:    She has no cervical adenopathy.  Neurological: She is alert and oriented to person, place, and time. She has normal reflexes. No cranial nerve deficit. She exhibits normal muscle tone. Coordination normal.  Skin: Skin is warm and dry. No rash noted. She is not diaphoretic. No erythema. No pallor.  Psychiatric: She has a normal mood and affect. Her behavior is normal. Judgment and thought content normal.  Vitals reviewed.  Vitals:   09/09/17 1215  BP: 92/64  Pulse: 73  SpO2: 92%  Weight: 160 lb 9.6 oz (72.8 kg)  Height: 5\' 6"  (1.676 m)    Estimated body mass index is 25.92 kg/m as calculated from the following:   Height as of this encounter: 5\' 6"  (1.676 m).   Weight as of this encounter: 160 lb 9.6 oz (72.8 kg).     Assessment:       ICD-10-CM   1. Stage 3 severe COPD by GOLD classification ([place]) J44.9   2. COPD, frequent exacerbations ([place]) J44.1   3. Flu vaccine need Z23        Plan:      COPD stable I do not think hair loss is related to COPD or other medications for COPD  Plan -Take trilogy samples because there is evidence this could help with recurrent COPD exacerbations and take this as a maintenance -Use albuterol as needed high-dose flu shot today -High-dose flu shot today -Start Daliresp with starter pack to prevent COPD exacerbations    -If you are having any GI side effects that is now; take it with food -In the future we will consider referral to voice rehab  because I think you have a component of vocal cord dysfunction  Follow-up 3 months or sooner if needed; CAPD cat score at follow-up   Dr. Brand Males, M.D., [facility]C.P Pulmonary and Critical Care Medicine Staff Physician, [place] Director - Interstitial Lung Disease  Program  Pulmonary [place] at [place], [place], [address]  Pager: [phone], If no answer or between  15:00h - 7:00h: call [phone] Telephone: [phone]

## 2017-09-09 NOTE — Telephone Encounter (Signed)
Called and spoke with pt asking her if she could remember which medication it was that she was needing patient assistance with through [place] and pt stated to me that she could not remember which one it was.  Will look through pt's chart at last OV's to see if I can figure out which med it is and if cannot figure it out, will ask MR to see if he can figure it out.  Nothing further needed at this time.

## 2017-09-09 NOTE — Patient Instructions (Addendum)
A few things to remember from today's visit:   Insomnia, unspecified type - Plan: traZODone (DESYREL) 50 MG tablet, LORazepam (ATIVAN) 2 MG tablet  Hypothyroidism, unspecified type  Essential hypertension  Anxiety disorder, unspecified type - Plan: LORazepam (ATIVAN) 2 MG tablet  For now no changes in Wellbutrin, may decrease next visit.  Lorazepam max 2 mg at bedtime.   Please be sure medication list is accurate. If a new problem present, please set up appointment sooner than planned today.

## 2017-09-09 NOTE — Progress Notes (Signed)
HPI:   [name] is a 71 y.o. female, who is here today to establish care.  Former PCP: Dr Sharlet Salina. Last preventive routine visit: 07/2015  Chronic medical problems: Breast cancer right breast, COPD,CKD III,HLD, PAD (mesenteric ischemia), and CAD among some. She follows with cardiologist.  Hypothyroidism:  Currently she is on Levothyroxine 112 mcg daily. Marland Kitchen Resumed about 2-3 weeks ago. Dx in 2010. Tolerating medication well, no side effects reported. She has not noted palpitations, abdominal pain, changes in bowel habits,  cold/heat intolerance, or abnormal weight loss.  Lab Results  Component Value Date   TSH 5.110 (H) 08/27/2017   Hx of constipation, she takes daily laxative.   Hypertension:   Dx about 10 years ago. Currently on Amlodipine 5 mg daily.  She is taking medications as instructed, no side effects reported.  She has not noted unusual headache, visual changes, exertional chest pain, changes in dyspnea (Hx of COPD), focal weakness, or worsening edema.   Lab Results  Component Value Date   CREATININE 0.83 08/27/2017   BUN 18 08/27/2017   NA 140 08/27/2017   K 5.2 08/27/2017   CL 102 08/27/2017   CO2 22 08/27/2017     Anxiety:  She is currently on Lorazepam 2 mg, which she has taken for 50 years. According to pt, former PCP tried to discontinue mediation and she did not tolerate well.So she had to take "somebody else's." She tells me that frequently she has taken an extra tab when she cannot sleep and has also taken Ambien from a friend but the latter one does not help.  In regard to prior treatments she states that she took "medication that is also for depression" but also for smoking cessation.  She is on Wellbutrin SR 150 mg bid, started in 04/2015 after pneumonia and to help with smoking cessation. She does not think this has helped with anxiety.  Insomnia:  Sleeps about 6-10 hours interrupted. Xanax helps some but still wakes  up. She has not tried other Rx meds for insomnia. OTC sleep aids do not help.  Noted on med list, Amitriptyline 50 mg. She states that she takes if for fibromyalgia and has taken it for 30 years or so. Generalized OA on Diclofenac 75 mg bid for pain. Work-up otherwise negative except for ANA. According to pt, she always has positive ANA and she has been told she does oit have RA or other connective tissue disease (01/2017).  COPD: She follows with pulmonologist, Dr Guadalupe Dawn She uses supplemental O2 2 LPM at night. Symptoms otherwise stable.  She also follows with dermatologist skin rash.  Review of Systems  Constitutional: Positive for fatigue. Negative for activity change, appetite change and fever.  HENT: Negative for mouth sores, nosebleeds, sore throat and trouble swallowing.   Eyes: Negative for redness and visual disturbance.  Respiratory: Positive for cough, shortness of breath (stable.) and wheezing.        COPD symptoms stable.   Cardiovascular: Negative for chest pain, palpitations and leg swelling.  Gastrointestinal: Negative for abdominal pain, nausea and vomiting.       Negative for changes in bowel habits.  Endocrine: Negative for cold intolerance and heat intolerance.  Genitourinary: Negative for decreased urine volume and hematuria.  Musculoskeletal: Positive for arthralgias, back pain and myalgias. Negative for gait problem.  Skin: Negative for pallor and wound.  Neurological: Negative for syncope, weakness and headaches.  Psychiatric/Behavioral: Positive for sleep disturbance. Negative for confusion and suicidal  ideas. The patient is nervous/anxious.       Current Outpatient Medications on File Prior to Visit  Medication Sig Dispense Refill  . albuterol (PROVENTIL HFA;VENTOLIN HFA) 108 (90 Base) MCG/ACT inhaler Inhale 2 puffs into the lungs every 4 (four) hours as needed for wheezing. As a RESCUE medication 1 Inhaler 1  . aspirin 81 MG tablet Take 81 mg by mouth  every evening.     Marland Kitchen atorvastatin (LIPITOR) 80 MG tablet TAKE 1 TABLET EVERY DAY. PLEASE KEEP UPCOMING APPT.THANK YOU 90 tablet 3  . Biotin 1000 MCG tablet Take 1,000 mg by mouth daily.    . bisacodyl (DULCOLAX) 5 MG EC tablet Take 1 tablet (5 mg total) by mouth daily as needed for moderate constipation. (Patient taking differently: Take 5 mg by mouth daily. ) 30 tablet 11  . buPROPion (WELLBUTRIN SR) 150 MG 12 hr tablet Take 1 tablet (150 mg total) by mouth 2 (two) times daily. Need annual visit for further refills 60 tablet 0  . cilostazol (PLETAL) 100 MG tablet TAKE 1 TABLET (100 MG TOTAL) BY MOUTH 2 (TWO) TIMES DAILY. 180 tablet 2  . diclofenac (VOLTAREN) 75 MG EC tablet TAKE ONE TABLET BY MOUTH TWICE DAILY 60 tablet 7  . ferrous sulfate 325 (65 FE) MG EC tablet Take 325 mg by mouth daily with breakfast.    . Fluticasone-Umeclidin-Vilant (TRELEGY ELLIPTA) 100-62.5-25 MCG/INH AEPB Inhale 1 puff into the lungs daily. 1 each 0  . guaiFENesin (MUCINEX) 600 MG 12 hr tablet Take 600 mg by mouth 2 (two) times daily as needed.     Marland Kitchen ipratropium-albuterol (DUONEB) 0.5-2.5 (3) MG/3ML SOLN USE 1 NEB 3 TIMES DAILY FOR 3 DAYS, THEN EVERY 6 HOURS AS NEEDED. 360 mL 0  . metoprolol tartrate (LOPRESSOR) 25 MG tablet TAKE 1 TABLET BY MOUTH 2 TIMES DAILY. (Patient taking differently: 1qdpm) 180 tablet 3  . omeprazole (PRILOSEC) 40 MG capsule TAKE 1 CAPSULE (40 MG TOTAL) BY MOUTH 2 (TWO) TIMES DAILY. (Patient taking differently: Take 40 mg by mouth daily. ) 180 capsule 3  . OXYGEN Inhale 2 L into the lungs at bedtime.    . roflumilast (DALIRESP) 500 MCG TABS tablet Take 1 tablet (500 mcg total) by mouth daily. 30 tablet 11  . Fluticasone-Umeclidin-Vilant (TRELEGY ELLIPTA) 100-62.5-25 MCG/INH AEPB Inhale 1 puff into the lungs daily. 1 each 0   No current facility-administered medications on file prior to visit.      Past Medical History:  Diagnosis Date  . Cancer ([place]) 2010   invasive ductal breast cancer; on  tamoxifen  . Complication of anesthesia    was in ICU after lung surgery 2012  . COPD (chronic obstructive pulmonary disease) ([place])   . Diverticulitis    required colcectomy  . Emphysema of lung ([place])   . Fibromyalgia 1995  . GERD (gastroesophageal reflux disease)   . Hearing loss of both ears    uses hearing aids  bilaterally  . Hx of colonic polyps    Dr. Cristina Gong -last study '11  . Hyperlipidemia   . Hypertension   . Hypothyroidism   . Osteoporosis   . Peripheral vascular disease [facility]) May '12 - CT angio   SMA chronic occlusion; stenosis of celiac trunk.  . Shortness of breath dyspnea   . Varicose veins of leg with swelling    varicose vein surgery - Dr. Aleda Grana  . Wears glasses    Allergies  Allergen Reactions  . Sulfonamide Derivatives Swelling  Swelling in face    Family History  Problem Relation Age of Onset  . Colon cancer Mother        colon  . COPD Mother        brown lung  . Hypertension Mother   . Diabetes Mother   . Emphysema Mother   . Coronary artery disease Father   . Heart attack Father   . Sudden death Father   . Heart disease Father   . Cancer Sister        fallopian tube  . Hypertension Sister   . Coronary artery disease Brother   . Throat cancer Sister   . Melanoma Sister   . Hypertension Sister   . Pancreatic cancer Sister   . Cancer Sister   . Heart attack Brother        early 48's  . Heart failure Sister     Social History   Socioeconomic History  . Marital status: Married    Spouse name: None  . Number of children: 3  . Years of education: 79  . Highest education level: None  Social Needs  . Financial resource strain: None  . Food insecurity - worry: None  . Food insecurity - inability: None  . Transportation needs - medical: None  . Transportation needs - non-medical: None  Occupational History  . Occupation: Chiropractor: RETIRED    Comment: retired  Tobacco Use  . Smoking status: Former Smoker     Packs/day: 0.75    Years: 50.00    Pack years: 37.50    Types: Cigarettes    Last attempt to quit: 03/18/2016    Years since quitting: 1.4  . Smokeless tobacco: Never Used  Substance and Sexual Activity  . Alcohol use: Yes    Alcohol/week: 6.0 oz    Types: 10 Cans of beer per week    Comment: occ  . Drug use: No  . Sexual activity: Not Currently  Other Topics Concern  . None  Social History Narrative   HSG, beauty school. Married '69 -27 yrs/widowed; married '79 - 44yr/divorced; married '87.  2 sons - '70, '74, 1 srtep-son; 1 grandchild. 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 SO - good health.  NO history of abuse.  ACP - full code and all heroic measures.     Vitals:   09/09/17 1420  BP: 100/70  Pulse: 76  Resp: 12  Temp: 97.6 F (36.4 C)  SpO2: 91%    Body mass index is 26.15 kg/m.  Physical Exam  Nursing note and vitals reviewed. Constitutional: She is oriented to person, place, and time. She appears well-developed and well-nourished. No distress.  HENT:  Head: Normocephalic and atraumatic.  Mouth/Throat: Oropharynx is clear and moist and mucous membranes are normal.  Eyes: Conjunctivae are normal. Pupils are equal, round, and reactive to light.  Neck: No tracheal deviation present. No thyroid mass and no thyromegaly present.  Cardiovascular: Normal rate and regular rhythm.  No murmur heard. DP pulses present bilateral.  Respiratory: Effort normal. No respiratory distress. She has wheezes (sporadic)).  GI: Soft. She exhibits no mass. There is no hepatomegaly. There is no tenderness.  Musculoskeletal: She exhibits no edema.  Lymphadenopathy:    She has no cervical adenopathy.  Neurological: She is alert and oriented to person, place, and time. She has normal strength.  Stable gait with no assistance.  Skin: Skin is warm. No erythema.  Psychiatric: Her mood appears anxious.  Well groomed, good  eye contact.    ASSESSMENT AND PLAN:   Ms. Syliva was seen today for  establish care.  Diagnoses and all orders for this visit:  Insomnia, unspecified type  Chronic. Good sleep hygiene recommended. No changes in Xanax 2 mg for now. We dicussed other treatment options, she is already on TCA. She agrees with trying Trazodone 25-50 mg at bedtime. We discussed some side effects and the risk of interaction with some of her meds. F/U in 4 weeks.  -     traZODone (DESYREL) 50 MG tablet; Take 0.5-1 tablets (25-50 mg total) by mouth at bedtime as needed for sleep. -     LORazepam (ATIVAN) 2 MG tablet; TAKE 1/2-1 TABLET BY MOUTH AT BEDTIME FOR ANXIETY prn  Hypothyroidism, unspecified type  Recent TSH above normal range. No changes in current management for now, since she just resumed med a few weeks ago. We will follow with TSH next OV.  -     levothyroxine (SYNTHROID) 112 MCG tablet; Take 1 tablet (112 mcg total) by mouth daily before breakfast.  Essential hypertension  Adequately controlled, on lower normal range. No changes in current management. Monitor BP at home. DASH-low salt diet recommended.  Anxiety disorder, unspecified type  Some side effects of Xanax discussed, including respiratory depression given her Hx of COPD. Refused psychiatric evaluation.  For now I am not changing dose, she has been on med for many years. But recommend trying taking 1/2 tab sometimes, she she has an extra 1/2 tab to take if needed during the night. No more than 2 mg per day. Explained that it is not appropriate to take somebody else meds and in this case controlled medications. She asked me if she can fill Rx earlier, she cannot take more than 30 tabs in 30 days. Today Rx for Xanax 2 mg # 30/0 given. For now no changes in Wellbutrin but next visit will consider decreasing it to 100 mg. F/U in 4 weeks.  -     LORazepam (ATIVAN) 2 MG tablet; TAKE 1/2-1 TABLET BY MOUTH AT BEDTIME FOR ANXIETY prn  Fibromyalgia  Side effects of Amitriptyline discussed. No changes in  current dose. Low impact physical activity and good sleep hygiene.  COPD, frequent exacerbations ([place])  Today mild wheezing,no respiratory distress. Reporting problem as stable. Continue following with pulmonologist.    Keeara Frees G. Martinique, MD  [facility]. [place] office.

## 2017-09-09 NOTE — Addendum Note (Signed)
Addended by: Lorretta Harp on: 09/09/2017 02:19 PM   Modules accepted: Orders

## 2017-09-10 ENCOUNTER — Other Ambulatory Visit: Payer: Self-pay | Admitting: Internal Medicine

## 2017-09-10 ENCOUNTER — Telehealth: Payer: Self-pay | Admitting: Internal Medicine

## 2017-09-10 MED ORDER — AMITRIPTYLINE HCL 50 MG PO TABS: 50.0000 mg | ORAL_TABLET | Freq: Every day | ORAL | 0 refills | Status: DC

## 2017-09-10 MED ORDER — AMLODIPINE BESYLATE 5 MG PO TABS: 5.0000 mg | ORAL_TABLET | Freq: Every day | ORAL | 1 refills | Status: DC

## 2017-09-10 NOTE — Telephone Encounter (Signed)
PA request received by [facility] on [address] for pt's Daliresp. PA initiated through [email or web address] Key: [id number] PA has been sent to plan, and a determination is expected within 1-3 business days.    Will send to EP for follow-up.

## 2017-09-10 NOTE — Addendum Note (Signed)
Addended by: Zacarias Pontes on: 09/10/2017 10:38 AM   Modules accepted: Orders

## 2017-09-12 ENCOUNTER — Encounter: Payer: Self-pay | Admitting: Family Medicine

## 2017-09-12 NOTE — Telephone Encounter (Signed)
Pt's daliresp has been approved through 10/14/18 under her medicare part D benefit. Will close this encounter.

## 2017-09-13 ENCOUNTER — Telehealth: Payer: Self-pay | Admitting: *Deleted

## 2017-09-13 NOTE — Telephone Encounter (Signed)
It is Ok to change to another Levothyroxine brand but she needs to continue the same one for now on to avoid fluctuations on thyroid function test.  Thanks, BJ

## 2017-09-13 NOTE — Telephone Encounter (Signed)
NEED CLARIFICATION FOR SYNTHROID 112 MG. UNABLE TO GET MYLAN BRAND IN STOCK, NEED AUTHORIZATION TO CHANGE TO LANNETT

## 2017-09-17 NOTE — Telephone Encounter (Signed)
Pharmacy notified of instructions and verbalized understanding.

## 2017-09-21 DIAGNOSIS — J449 Chronic obstructive pulmonary disease, unspecified: Secondary | ICD-10-CM | POA: Diagnosis not present

## 2017-10-01 DIAGNOSIS — R195 Other fecal abnormalities: Secondary | ICD-10-CM | POA: Diagnosis not present

## 2017-10-01 DIAGNOSIS — R110 Nausea: Secondary | ICD-10-CM | POA: Diagnosis not present

## 2017-10-01 DIAGNOSIS — K5901 Slow transit constipation: Secondary | ICD-10-CM | POA: Diagnosis not present

## 2017-10-03 NOTE — Progress Notes (Signed)
HPI:   [name] is a 71 y.o. female, who is here today to follow on recent OV.   She was seen on 09/09/17, when she was mainly concerned about insomnia. Taking Xanax more than prescribed by former PCP and taking his friend's Ambien. Last OV she was started on Trazodone. She is also on Amitriptyline 50 mg at bedtime.   She is sleeping "much better" with Trazodone, up to 6 hours straight.  Today initially pulse O2 was 86%, she is reporting low O2 sats at home. She uses supplemental O2 at night, has had 77% the lowest. Exacerbated by exertion.  Hx of COPD, she has not noted worsening dyspnea ,cough,or wheezing. She follows with pulmonologist.     She has hx of OA, she read Diclofenac side effects after last OV and decided to discontinue. "Bad" joint pain. Exacerbated by movement, prolonged standing and walking. Going up and down stairs aggravate knee pain. Alleviated by rest.  Hands, L>R, feet,knees.   Review of Systems  Constitutional: Positive for fatigue (improved). Negative for activity change, appetite change and fever.  HENT: Negative for mouth sores, nosebleeds and trouble swallowing.   Eyes: Negative for redness and visual disturbance.  Cardiovascular: Negative for chest pain, palpitations and leg swelling.  Gastrointestinal: Negative for abdominal pain, nausea and vomiting.       Negative for changes in bowel habits.  Genitourinary: Negative for decreased urine volume, difficulty urinating, dysuria and hematuria.  Musculoskeletal: Positive for arthralgias. Negative for joint swelling and myalgias.  Skin: Negative for rash.  Neurological: Negative for syncope, weakness and headaches.  Psychiatric/Behavioral: Positive for sleep disturbance. Negative for confusion. The patient is nervous/anxious.       Current Outpatient Medications on File Prior to Visit  Medication Sig Dispense Refill  . albuterol (PROVENTIL HFA;VENTOLIN HFA) 108 (90 Base)  MCG/ACT inhaler Inhale 2 puffs into the lungs every 4 (four) hours as needed for wheezing. As a RESCUE medication 1 Inhaler 1  . amitriptyline (ELAVIL) 50 MG tablet Take 1 tablet (50 mg total) by mouth daily. 90 tablet 0  . amLODipine (NORVASC) 5 MG tablet Take 1 tablet (5 mg total) by mouth daily. 90 tablet 1  . aspirin 81 MG tablet Take 81 mg by mouth every evening.     Marland Kitchen atorvastatin (LIPITOR) 80 MG tablet TAKE 1 TABLET EVERY DAY. PLEASE KEEP UPCOMING APPT.THANK YOU 90 tablet 3  . Biotin 1000 MCG tablet Take 1,000 mg by mouth daily.    . bisacodyl (DULCOLAX) 5 MG EC tablet Take 1 tablet (5 mg total) by mouth daily as needed for moderate constipation. (Patient taking differently: Take 5 mg by mouth daily. ) 30 tablet 11  . buPROPion (WELLBUTRIN SR) 150 MG 12 hr tablet Take 1 tablet (150 mg total) by mouth 2 (two) times daily. Need annual visit for further refills 60 tablet 0  . cilostazol (PLETAL) 100 MG tablet TAKE 1 TABLET (100 MG TOTAL) BY MOUTH 2 (TWO) TIMES DAILY. 180 tablet 2  . fluticasone (FLONASE) 50 MCG/ACT nasal spray USE 2 SPRAYS IN BOTH NOSTRILS DAILY. 16 g 4  . guaiFENesin (MUCINEX) 600 MG 12 hr tablet Take 600 mg by mouth 2 (two) times daily as needed.     Marland Kitchen ipratropium-albuterol (DUONEB) 0.5-2.5 (3) MG/3ML SOLN USE 1 NEB 3 TIMES DAILY FOR 3 DAYS, THEN EVERY 6 HOURS AS NEEDED. 360 mL 0  . IRON PO Take 85 mg by mouth daily.    Marland Kitchen levothyroxine (  SYNTHROID) 112 MCG tablet Take 1 tablet (112 mcg total) by mouth daily before breakfast. 90 tablet 1  . omeprazole (PRILOSEC) 40 MG capsule TAKE 1 CAPSULE (40 MG TOTAL) BY MOUTH 2 (TWO) TIMES DAILY. (Patient taking differently: Take 40 mg by mouth daily. ) 180 capsule 3  . OXYGEN Inhale 2 L into the lungs at bedtime.    . traZODone (DESYREL) 50 MG tablet Take 0.5-1 tablets (25-50 mg total) by mouth at bedtime as needed for sleep. 30 tablet 1   No current facility-administered medications on file prior to visit.      Past Medical History:    Diagnosis Date  . Cancer ([place]) 2010   invasive ductal breast cancer; on tamoxifen  . Complication of anesthesia    was in ICU after lung surgery 2012  . COPD (chronic obstructive pulmonary disease) ([place])   . Diverticulitis    required colcectomy  . Emphysema of lung ([place])   . Fibromyalgia 1995  . GERD (gastroesophageal reflux disease)   . Hearing loss of both ears    uses hearing aids  bilaterally  . Hx of colonic polyps    Dr. Cristina Gong -last study '11  . Hyperlipidemia   . Hypertension   . Hypothyroidism   . Osteoporosis   . Peripheral vascular disease [facility]) May '12 - CT angio   SMA chronic occlusion; stenosis of celiac trunk.  . Shortness of breath dyspnea   . Varicose veins of leg with swelling    varicose vein surgery - Dr. Aleda Grana  . Wears glasses    Allergies  Allergen Reactions  . Sulfonamide Derivatives Swelling    Swelling in face    Social History   Socioeconomic History  . Marital status: Married    Spouse name: None  . Number of children: 3  . Years of education: 70  . Highest education level: None  Social Needs  . Financial resource strain: None  . Food insecurity - worry: None  . Food insecurity - inability: None  . Transportation needs - medical: None  . Transportation needs - non-medical: None  Occupational History  . Occupation: Chiropractor: RETIRED    Comment: retired  Tobacco Use  . Smoking status: Former Smoker    Packs/day: 0.75    Years: 50.00    Pack years: 37.50    Types: Cigarettes    Last attempt to quit: 03/18/2016    Years since quitting: 1.5  . Smokeless tobacco: Never Used  Substance and Sexual Activity  . Alcohol use: Yes    Alcohol/week: 6.0 oz    Types: 10 Cans of beer per week    Comment: occ  . Drug use: No  . Sexual activity: Not Currently  Other Topics Concern  . None  Social History Narrative   HSG, beauty school. Married '69 -22 yrs/widowed; married '79 - 95yr/divorced; married '87.  2 sons - '70,  '74, 1 srtep-son; 1 grandchild. 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 SO - good health.  NO history of abuse.  ACP - full code and all heroic measures.     Vitals:   10/04/17 1410  BP: 108/66  Pulse: 75  Resp: 16  Temp: 97.9 F (36.6 C)  SpO2: 90%   Body mass index is 24.86 kg/m.   Physical Exam  Nursing note and vitals reviewed. Constitutional: She is oriented to person, place, and time. She appears well-developed and well-nourished. No distress.  HENT:  Head: Normocephalic and atraumatic.  Mouth/Throat: Oropharynx is clear and moist and mucous membranes are normal.  Eyes: Conjunctivae are normal.  Cardiovascular: Normal rate and regular rhythm.  No murmur heard. Pulses:      Dorsalis pedis pulses are 2+ on the right side, and 2+ on the left side.  Respiratory: Effort normal and breath sounds normal. No respiratory distress.  GI: There is no hepatomegaly.  Musculoskeletal: She exhibits edema (Trace pitting LE edema,bilateral.). She exhibits no tenderness.  Bilateral Heberden's  and Bouchard's nodes IP joints bilateral. No signs of synovitis.  Lymphadenopathy:    She has no cervical adenopathy.  Neurological: She is alert and oriented to person, place, and time. She has normal strength. Coordination and gait normal.  Skin: Skin is warm. No erythema.  Psychiatric: She has a normal mood and affect.  Well groomed, good eye contact.      ASSESSMENT AND PLAN:   Ms. Shirely was seen today for follow-up.  Diagnoses and all orders for this visit:  Insomnia, unspecified type  Improved. No changes in Trazodone 50 mg. Continue Lorazepam at bedtime. Side effects of meds discussed. Good sleep hygiene. F/U in 3 months,before if needed.  -     LORazepam (ATIVAN) 2 MG tablet; TAKE 1/2-1 TABLET BY MOUTH AT BEDTIME FOR ANXIETY prn  Anxiety disorder, unspecified type  Stable overall. No changes in Lorazepam dose. Instructed about warning signs.  -     LORazepam (ATIVAN) 2 MG  tablet; TAKE 1/2-1 TABLET BY MOUTH AT BEDTIME FOR ANXIETY prn  Arthropathy  Topical Diclofenac recommended. Tylenol 500 mg tid as needed may also help. F/U in 3 months.  -     diclofenac sodium (VOLTAREN) 1 % GEL; Apply 4 g topically 4 (four) times daily.   COPD, frequent exacerbations ([place])  No changes in current management. Recommend arranging F/U appt with pulmonologist if frequent O2 sats < 89%. Supplemental O2 to use during the day as needed. Instructed about warning signs. No changes in current management.       [name]. Martinique, MD  [facility]. [place] office.

## 2017-10-04 ENCOUNTER — Encounter: Payer: Self-pay | Admitting: Family Medicine

## 2017-10-04 ENCOUNTER — Ambulatory Visit (INDEPENDENT_AMBULATORY_CARE_PROVIDER_SITE_OTHER): Payer: Medicare Other | Admitting: Family Medicine

## 2017-10-04 VITALS — BP 108/66 | HR 75 | Temp 97.9°F | Resp 16 | Ht 66.0 in | Wt 154.0 lb

## 2017-10-04 DIAGNOSIS — M129 Arthropathy, unspecified: Secondary | ICD-10-CM

## 2017-10-04 DIAGNOSIS — G4700 Insomnia, unspecified: Secondary | ICD-10-CM

## 2017-10-04 DIAGNOSIS — F419 Anxiety disorder, unspecified: Secondary | ICD-10-CM | POA: Diagnosis not present

## 2017-10-04 DIAGNOSIS — J441 Chronic obstructive pulmonary disease with (acute) exacerbation: Secondary | ICD-10-CM | POA: Diagnosis not present

## 2017-10-04 MED ORDER — DICLOFENAC SODIUM 1 % TD GEL: 4.0000 g | Freq: Four times a day (QID) | TRANSDERMAL | 3 refills | Status: DC

## 2017-10-04 MED ORDER — LORAZEPAM 2 MG PO TABS: ORAL_TABLET | ORAL | 2 refills | Status: DC

## 2017-10-04 MED ORDER — LORAZEPAM 2 MG PO TABS: ORAL_TABLET | ORAL | 0 refills | Status: DC

## 2017-10-04 NOTE — Patient Instructions (Addendum)
A few things to remember from today's visit:   Insomnia, unspecified type - Plan: LORazepam (ATIVAN) 2 MG tablet, DISCONTINUED: LORazepam (ATIVAN) 2 MG tablet  Anxiety disorder, unspecified type - Plan: LORazepam (ATIVAN) 2 MG tablet, DISCONTINUED: LORazepam (ATIVAN) 2 MG tablet  Arthropathy - Plan: diclofenac sodium (VOLTAREN) 1 % GEL  No changes today. Oxygen supplement as needed. Follow with your lung doctor.   Please be sure medication list is accurate. If a new problem present, please set up appointment sooner than planned today.

## 2017-10-09 ENCOUNTER — Telehealth: Payer: Self-pay | Admitting: Internal Medicine

## 2017-10-09 NOTE — Telephone Encounter (Signed)
On oxygen 24/7, 4L. Was just on oxygen night. Productive cough, white phlegm. Denies any fever.   Spoke with patient's husband Edd Arbour. He stated that the patient is now on O2 at 4L 24/7. Originally she was on 2L at night only. Increased productive cough with white phlegm. Denies any fever but does have occasionally back pain when coughing.   Explained to patient's husband that we do not have appts today but we did have an appt with MW for tomorrow at 73p. Advised him to call us back today to see if we have any openings or go to the nearest ER if she gets any worse. He verbalized understanding. Nothing else needed at time of call.

## 2017-10-10 ENCOUNTER — Encounter: Payer: Self-pay | Admitting: Internal Medicine

## 2017-10-10 ENCOUNTER — Ambulatory Visit (INDEPENDENT_AMBULATORY_CARE_PROVIDER_SITE_OTHER)
Admission: RE | Admit: 2017-10-10 | Discharge: 2017-10-10 | Disposition: A | Discharge status: Home / Self Care | Payer: Medicare Other | Source: Ambulatory Visit | Attending: Internal Medicine | Admitting: Internal Medicine

## 2017-10-10 ENCOUNTER — Ambulatory Visit: Payer: Medicare Other | Admitting: Internal Medicine

## 2017-10-10 VITALS — BP 90/60 | HR 68 | Ht 66.0 in | Wt 150.8 lb

## 2017-10-10 DIAGNOSIS — R05 Cough: Secondary | ICD-10-CM | POA: Diagnosis not present

## 2017-10-10 DIAGNOSIS — J441 Chronic obstructive pulmonary disease with (acute) exacerbation: Secondary | ICD-10-CM | POA: Diagnosis not present

## 2017-10-10 DIAGNOSIS — J9611 Chronic respiratory failure with hypoxia: Secondary | ICD-10-CM | POA: Diagnosis not present

## 2017-10-10 DIAGNOSIS — R0602 Shortness of breath: Secondary | ICD-10-CM | POA: Diagnosis not present

## 2017-10-10 MED ORDER — TRAZODONE HCL 50 MG PO TABS: 25.0000 mg | ORAL_TABLET | Freq: Every evening | ORAL | 2 refills | Status: DC | PRN

## 2017-10-10 MED ORDER — FLUTICASONE-UMECLIDIN-VILANT 100-62.5-25 MCG/INH IN AEPB: 1.0000 | INHALATION_SPRAY | Freq: Every day | RESPIRATORY_TRACT | 0 refills | Status: DC

## 2017-10-10 MED ORDER — PREDNISONE 10 MG PO TABS: ORAL_TABLET | ORAL | 0 refills | Status: DC

## 2017-10-10 MED ORDER — ACETAMINOPHEN-CODEINE #3 300-30 MG PO TABS: ORAL_TABLET | ORAL | 0 refills | Status: DC

## 2017-10-10 MED ORDER — IPRATROPIUM-ALBUTEROL 0.5-2.5 (3) MG/3ML IN SOLN
3.0000 mL | Freq: Once | RESPIRATORY_TRACT | Status: AC
  Administered 2017-10-10: 3 mL via RESPIRATORY_TRACT

## 2017-10-10 NOTE — Progress Notes (Signed)
Subjective:     Patient ID: [name], female   DOB: 09-03-1946, 71 y.o.   MRN: [id number]             Follow-up Gold stage III COPD  She has recurrent exacerbation. Most recently was over a month ago when she was hospitalized. She currently feels baseline. She feels good. She is not on oxygen therapy. Reviewed her medication list and shows that she is only on Advair as control therapy for COPD. She has no idea why she is not on an anticholinergic. DuoNeb was listed but she says she only takes it sparingly as needed and since her discharge is not needing it. She has normal work and anticholinergic is. She also tells me that because of her eosinophilia and elevated IgE and seasonal allergies wants a referral to allergy clinic. She is seeing Dr. Allyson Sabal for her skin issues and she is feeling better.         Results for [name], [name] (MRN [id number]) as of 12/31/2016 12:33  Ref. Range 07/04/2016 05:26 07/05/2016 03:23 07/10/2016 05:13 11/16/2016 13:20 11/17/2016 05:57  Eosinophils Absolute Latest Ref Range: 0.0 - 0.7 K/uL    1.6 (H) 0.0    Results for [name], [name] (MRN [id number]) as of 12/31/2016 12:33  Ref. Range 11/16/2016 13:20  IgE (Immunoglobulin E), Serum Latest Ref Range: 0 - 100 IU/mL 180 (H)    OV 02/26/2017  Chief Complaint  Patient presents with  . Follow-up    FOLLOW UP FOR patient states that she is here for SOB and her voice is horse and she has wheezing all the time patient denies c/p or tightness     Follow-up Gold stage III COPD: She is on triple inhaler therapy with Spiriva and Advair. Has COPD stable. She has elevated IgE and eosinophilia. She has seen allergist and then got referred to Dr. Lucia Gaskins ENT who apparently treated her for pharyngeal or laryngeal thrush and this has helped. She also has skin rash. I referred her to Dr. Allyson Sabal and she's tried some prednisone course and has had skin biopsy with these things have not helped. Nevertheless she feels she is under good care.  Overall in terms of COPD she stable but she does have significant cough and is a history of frequent exacerbations. But she does not think she is an exacerbation today  The new issue appears to be that she has last few days she has right lower extremity swelling greater than the left. There is some associated warmth    03/07/2017 Follow UP OV for Chronic DVT:  Pt. Was seen by Dr. Chase Caller 02/26/2017 for leg swelling. He was concerned about possibility of acute DVT and ordered a doppler study. The patient did not have any chest pain or shortness of breath at the time. 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 Dr. Chase Caller chose not to treat as  this is a chronic problem. He wanted the patient to be diligent with her ASA daily.The patient returns today for follow up. She continues to have no respiratory issues. Her COPD is stable and at baseline. She does  not have any chest pain, or tachycardia. She states her baseline dyspnea that she experiences with her COPD  is unchanged. She continues to have issues with bilateral leg swelling.She is not wearing her oxygen with exertion as prescribed . She states she is wearing it at night. We did discuss the additional strain  placed on the heart when  oxygen levels fall below 88%, and the importance of compliance with oxygen on exertion. Most recent hospital admission for COPD exacerbation 11/2016. She was prophylaxed with heparin for DVT prophylaxis at that time.    OV 06/04/2017  Chief Complaint  Patient presents with  . Follow-up    Pt states her SOB has recently worsened x 1 week. Pt c/o prod cough with opaque colored mucus. Pt denies CP/tightness and f/c/s.    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 Here with her husband. She initially told me that she was stable and doing well without exacerbations but has a continued to talk to her she  initially told me that she's been coughing more than usual for past one day but then has been interjected saying she's been coughing more than usual for a few weeks. She agreed to this. She admitted that the sputum is a little bit more discolored than baseline. She is also more short of breath and wheezing more and coughing more than baseline.  Her recent months have been complicated by chronic DVT finding for which she says she is seeing vascular surgery Dr. Adele Barthel and his been placed on observation therapy according to history. She's also had cellulitis issues with her feet. She showed me treatment medication of clindamycin and cephalexin at [facility] and was to hospital in the middle of June 2018. She says for the last few months she's been having excessive hair loss and she's going to see dermatology for this. She's wondering if antibiotic and prednisone and contributing to hair loss. At the same time she is worried about recurrent COPD exacerbation and leaving it untreated.      07/15/2017 Acute OV:  [name] is a 71 y.o. female former smoker with COPD Gold III and history of breast cancer.She is followed by Dr. Chase Caller.  Pt. Presents for acute visit with a 2 week history of worsening shortness of breath, cough, increased wheezing. She endorses tightness in her chest. She is coughing up clear foamy white secretions. No significant increase in amount of secretions.She states she has not had a fever. She is compliant with her Advair and Spiriva daily. She prefers Trelegy, but cannot afford it. She is compliant with her Duonebs,  Mucinex, Flonase and has had increased use in frequency of need for rescue inhaler. She is currently on oxygen nocturnally. She has been checking her saturations, and has found that her saturations during the day have dropped as low as the 70's. She was at 86% today. We have placed her on oxygen at 2 L . She has rebounded to 92%.She denies fever, chest pain,  or hemoptysis. She states she has been sleeping propped on 2 pillows for months.  Test Results:   OV 09/09/2017  Chief Complaint  Patient presents with  . Follow-up    Pt states that she has been doing good. States that she started coughing with thick mucus x1 week ago. Also states that she has SOB with exertion. Denies any CP.    Gold stage III COPD with a history of breast cancer.  She has frequent exacerbations.  Last August 2018.  Then after that in October 2018 again had an exacerbation and so my nurse practitioner.  She tells me that she is taking different inhalers because of cost issues although her best preferred inhalers trelegy..  Since that she has quit smoking.  She  is very worried about her hair loss and has an appoint with Dr. Allyson Sabal.  She says that she saw a dermatologist at [facility] and was recommended being in a tanning bed for 30 days but this is not helped.  She is looking forward to seeing Dr. Allyson Sabal.  Currently feels COPD stable with a COPD CAT score of 17 but she does have mild for secretory wheeze.  She is extremely interested in [place] for prevention of COPD exacerbations.    CAT COPD Symptom & Quality of Life Score (GSK trademark) 0 is no burden. 5 is highest burden 09/09/2017   Never Cough -> Cough all the time 3  No phlegm in chest -> Chest is full of phlegm 3  No chest tightness -> Chest feels very tight 3  No dyspnea for 1 flight stairs/hill -> Very dyspneic for 1 flight of stairs 0  No limitations for ADL at home -> Very limited with ADL at home 0  Confident leaving home -> Not at all confident leaving home 0  Sleep soundly -> Do not sleep soundly because of lung condition 5  Lots of Energy -> No energy at all 3  TOTAL Score (max 40)  17        Results for [name], [name] (MRN [id number]) as of 06/04/2017 12:27  Ref. Range 08/16/2016 14:35 10/26/2016 07:04  FEV1-Post Latest Units: L 1.37 1.03  FEV1-%Pred-Post Latest Units: % 56 44   FEV1-%Change-Post Latest Units: % 0 22       has a past medical history of Cancer ([place]) (7867), Complication of anesthesia, COPD (chronic obstructive pulmonary disease) ([place]), Diverticulitis, Emphysema of lung ([place]), Fibromyalgia (1995), GERD (gastroesophageal reflux disease), Hearing loss of both ears, colonic polyps, Hyperlipidemia, Hypertension, Hypothyroidism, Osteoporosis, Peripheral vascular disease [facility]) (May '12 - CT angio), Shortness of breath dyspnea, Varicose veins of leg with swelling, and Wears glasses.       10/10/2017 acute extended ov/Warnie Belair re: aecopd  Chief Complaint  Patient presents with  . Acute Visit    Increased SOB x wks. She also c/o cough with yellow sputum, wheezing and chest tightness.  She has been using her o2 3lpm 24/7. She had only been using 2lpm with sleep. She was 76% ra today b/c her tank ran out-increased to 89% 4lpm.    doing usual baeline for several months on  advair / proair prnsev times for several  Week then gradually worse x "week" (very vague) with sob using neb 3-4 x daily and helps a lot for few hours had gone from just 2lpm at hs to 4lpm POC and still desats assoc with increasing cough / subj wheeze and gen chest tightness - comfortable at rest p neb  Also quite fatigued / feels weak when stands but po intake ok   No obvious day to day or daytime variability or assoc   mucus plugs or hemoptysis or cp     or overt   or hb symptoms. No unusual exposure hx or h/o childhood pna/ asthma or knowledge of premature birth.    Also denies any obvious fluctuation of symptoms with weather or environmental changes or other aggravating or alleviating factors except as outlined above   Current Allergies, Complete Past Medical History, Past Surgical History, Family History, and Social History were reviewed in [employer] record.  ROS  The following are not active complaints unless bolded Hoarseness, sore throat, dysphagia, dental  problems, itching, sneezing,  nasal congestion or discharge of excess  mucus or purulent secretions, ear ache,   fever, chills, sweats, unintended wt loss or wt gain, classically pleuritic or exertional cp,  orthopnea pnd or leg swelling, presyncope, palpitations, abdominal pain, anorexia, nausea, vomiting, diarrhea  or change in bowel habits or change in bladder habits, change in stools or change in urine, dysuria, hematuria,  rash, arthralgias, visual complaints, headache, numbness, weakness or ataxia or problems with walking or coordination,  change in mood/affect or memory.        Current Meds  Medication Sig  . albuterol (PROVENTIL HFA;VENTOLIN HFA) 108 (90 Base) MCG/ACT inhaler Inhale 2 puffs into the lungs every 4 (four) hours as needed for wheezing. As a RESCUE medication  . amitriptyline (ELAVIL) 50 MG tablet Take 1 tablet (50 mg total) by mouth daily.  Marland Kitchen amLODipine (NORVASC) 5 MG tablet Take 1 tablet (5 mg total) by mouth daily.  Marland Kitchen aspirin 81 MG tablet Take 81 mg by mouth every evening.   . Biotin 1000 MCG tablet Take 1,000 mg by mouth daily.  . bisacodyl (DULCOLAX) 5 MG EC tablet Take 1 tablet (5 mg total) by mouth daily as needed for moderate constipation. (Patient taking differently: Take 5 mg by mouth daily. )  . buPROPion (WELLBUTRIN SR) 150 MG 12 hr tablet Take 1 tablet (150 mg total) by mouth 2 (two) times daily. Need annual visit for further refills  . cilostazol (PLETAL) 100 MG tablet TAKE 1 TABLET (100 MG TOTAL) BY MOUTH 2 (TWO) TIMES DAILY.  Marland Kitchen diclofenac sodium (VOLTAREN) 1 % GEL Apply 4 g topically 4 (four) times daily.  . fluticasone (FLONASE) 50 MCG/ACT nasal spray USE 2 SPRAYS IN BOTH NOSTRILS DAILY.  Marland Kitchen guaiFENesin (MUCINEX) 600 MG 12 hr tablet Take 600 mg by mouth 2 (two) times daily as needed.   Marland Kitchen ipratropium-albuterol (DUONEB) 0.5-2.5 (3) MG/3ML SOLN USE 1 NEB 3 TIMES DAILY FOR 3 DAYS, THEN EVERY 6 HOURS AS NEEDED.  Marland Kitchen IRON PO Take 85 mg by mouth daily.  Marland Kitchen levothyroxine  (SYNTHROID) 112 MCG tablet Take 1 tablet (112 mcg total) by mouth daily before breakfast.  . LORazepam (ATIVAN) 2 MG tablet TAKE 1/2-1 TABLET BY MOUTH AT BEDTIME FOR ANXIETY prn  . omeprazole (PRILOSEC) 40 MG capsule TAKE 1 CAPSULE (40 MG TOTAL) BY MOUTH 2 (TWO) TIMES DAILY. (Patient taking differently: Take 40 mg by mouth daily. )  . OXYGEN Inhale 2 L into the lungs at bedtime.  . [DISCONTINUED] atorvastatin (LIPITOR) 80 MG tablet TAKE 1 TABLET EVERY DAY. PLEASE KEEP UPCOMING APPT.THANK YOU  .  metoprolol tartrate (LOPRESSOR) 25 MG tablet TAKE 1 TABLET BY MOUTH 2 TIMES DAILY. (Patient taking differently: 1qdpm)  . [DISCONTINUED] traZODone (DESYREL) 50 MG tablet Take 0.5-1 tablets (25-50 mg total) by mouth at bedtime as needed for sleep.                      Objective:   Physical Exam    Elderly wf with prominent pseudowheeze   Wt Readings from Last 3 Encounters:  10/10/17 150 lb 12.8 oz (68.4 kg)  10/04/17 154 lb (69.9 kg)  09/09/17 162 lb (73.5 kg)     Vital signs reviewed - Note on arrival 02 sats  87% on 3lpm POC  And 89% on 4lpm continuous   HEENT: nl dentition, turbinates bilaterally, and oropharynx. Nl external ear canals without cough reflex   NECK :  without JVD/Nodes/TM/ nl carotid upstrokes bilaterally   LUNGS: no acc muscle use, barrel contour  chest / pan exp sonorous rhonchi bilaterally    CV:  RRR  no s3 or murmur or increase in P2, and no edema   ABD:  soft and nontender with nl inspiratory excursion in the supine position. No bruits or organomegaly appreciated, bowel sounds nl  MS:  Nl gait/ ext warm without deformities, calf tenderness, cyanosis or clubbing No obvious joint restrictions   SKIN: warm and dry without lesions    NEURO:  alert, approp, nl sensorium with  no motor or cerebellar deficits apparent.     CXR PA and Lateral:   10/10/2017 :    I personally reviewed images and agree with radiology impression as follows:    1.  Hyperinflation with diffuse emphysematous disease and chronic interstitial opacity 2. No acute focal pulmonary infiltrate is seen       Assessment:

## 2017-10-10 NOTE — Patient Instructions (Addendum)
Change 02 to 4lpm 24/7 for now  Prednisone 10 mg take  4 each am x 2 days,   2 each am x 2 days,  1 each am x 2 days and stop   Change prilosec 40 mg  to where you take it Take 30- 60 min before your first and last meals of the day until better then back to one daily before breakfast  Stop lopressor altogether   Plan A = Automatic = Stop advair and start trelegy one each am   Plan B = Backup Only use your albuterol as a rescue medication to be used if you can't catch your breath by resting or doing a relaxed purse lip breathing pattern.  - The less you use it, the better it will work when you need it. - Ok to use the inhaler up to 2 puffs  every 4 hours if you must but call for appointment if use goes up over your usual need - Don't leave home without it !!  (think of it like the spare tire for your car)   Plan C = Crisis - only use your albuterol/ipatropium nebulizer if you first try Plan B and it fails to help > ok to use the nebulizer up to every 3-4 hours but if not helping and can't catch your breath go to ER immediately   For cough > mucinex dm 1200 mg every 12 hours and use the flutter valve as much as possible and if still coughing then take tylenol #3 one every 4 hours as needed   Please schedule a follow up office visit in 4 weeks, sooner if needed  with all medications /inhalers/ solutions in hand so we can verify exactly what you are taking. This includes all medications from all doctors and over the counters - to see Ramasamy or one of our NPs

## 2017-10-11 ENCOUNTER — Other Ambulatory Visit: Payer: Self-pay | Admitting: Nurse Practitioner

## 2017-10-12 ENCOUNTER — Other Ambulatory Visit: Payer: Self-pay | Admitting: Internal Medicine

## 2017-10-14 ENCOUNTER — Other Ambulatory Visit: Payer: Self-pay

## 2017-10-14 ENCOUNTER — Encounter: Payer: Self-pay | Admitting: Internal Medicine

## 2017-10-14 DIAGNOSIS — J9611 Chronic respiratory failure with hypoxia: Secondary | ICD-10-CM | POA: Insufficient documentation

## 2017-10-14 NOTE — Assessment & Plan Note (Addendum)
Increased 02 demands with flare and nl HC03 on last bmet 4 weeks prior to OV  So ok to increase 02 to 4lpm for now

## 2017-10-14 NOTE — Assessment & Plan Note (Addendum)
10/10/2017  After extensive coaching inhaler device  effectiveness =    90% with DPI  DDX of  difficult airways management almost all start with A and  include Adherence, Ace Inhibitors, Acid Reflux, Active Sinus Disease, Alpha 1 Antitripsin deficiency, Anxiety masquerading as Airways dz,  ABPA,  Allergy(esp in young), Aspiration (esp in elderly), Adverse effects of meds,  Active smokers, A bunch of PE's (a small clot burden can't cause this syndrome unless there is already severe underlying pulm or vascular dz with poor reserve) plus two Bs  = Bronchiectasis and Beta blocker use..and one C= CHF  Adherence is always the initial "prime suspect" and is a multilayered concern that requires a "trust but verify" approach in every patient - starting with knowing how to use medications, especially inhalers, correctly, keeping up with refills and understanding the fundamental difference between maintenance and prns vs those medications only taken for a very short course and then stopped and not refilled.  - see device teaching above  - cost issues may play a role here.  Formulary restrictions will be an ongoing challenge for the forseable future and I would be happy to pick an alternative if the pt will first  provide me a list of them but pt  will need to return here for training for any new device that is required eg dpi vs hfa vs respimat.    In meantime we can always provide samples so the patient never runs out of any needed respiratory medications.   - return with all meds in hand using a trust but verify approach to confirm accurate Medication  Reconciliation The principal here is that until we are certain that the  patients are doing what we've asked, it makes no sense to ask them to do more.    ? Acid (or non-acid) GERD > always difficult to exclude as up to 75% of pts in some series report no assoc GI/ Heartburn symptoms> rec max (24h)  acid suppression and diet restrictions/ reviewed and instructions  given in writing.    ? Allergy/ asthma component >  Prednisone 10 mg take  4 each am x 2 days,   2 each am x 2 days,  1 each am x 2 days and stop    ? Active smoking > denies x one year  ? BB effect > does not need it anyway based on bp > d/c  ? chf > nothing to suggest clinically and last echo 05/3661 just diastolic dysfunction G I    I had an extended discussion with the patient reviewing all relevant studies completed to date and  lasting 25 minutes of a 40  minute acute visit with pt new to me    re  severe non-specific but potentially very serious refractory respiratory symptoms of uncertain and potentially multiple  etiologies.   Each maintenance medication was reviewed in detail including most importantly the difference between maintenance and prns and under what circumstances the prns are to be triggered using an action plan format that is not reflected in the computer generated alphabetically organized AVS.    Please see AVS for specific instructions unique to this office visit that I personally wrote and verbalized to the the pt in detail and then reviewed with pt  by my nurse highlighting any changes in therapy/plan of care  recommended at today's visit.         Marland Kitchen

## 2017-10-15 ENCOUNTER — Other Ambulatory Visit: Payer: Self-pay | Admitting: Internal Medicine

## 2017-10-22 DIAGNOSIS — J449 Chronic obstructive pulmonary disease, unspecified: Secondary | ICD-10-CM | POA: Diagnosis not present

## 2017-10-30 NOTE — Telephone Encounter (Signed)
Will close encounter has Duoneb has been sent to preferred pharmacy. Nothing further is needed

## 2017-11-08 ENCOUNTER — Ambulatory Visit: Payer: Medicare Other | Admitting: Internal Medicine

## 2017-11-08 ENCOUNTER — Encounter: Payer: Self-pay | Admitting: Internal Medicine

## 2017-11-08 VITALS — BP 106/60 | HR 73 | Ht 66.0 in | Wt 151.0 lb

## 2017-11-08 DIAGNOSIS — J9611 Chronic respiratory failure with hypoxia: Secondary | ICD-10-CM | POA: Diagnosis not present

## 2017-11-08 DIAGNOSIS — J441 Chronic obstructive pulmonary disease with (acute) exacerbation: Secondary | ICD-10-CM

## 2017-11-08 MED ORDER — FLUTICASONE-UMECLIDIN-VILANT 100-62.5-25 MCG/INH IN AEPB: 1.0000 | INHALATION_SPRAY | Freq: Every day | RESPIRATORY_TRACT | 0 refills | Status: DC

## 2017-11-08 MED ORDER — ROFLUMILAST 500 MCG PO TABS: 500.0000 ug | ORAL_TABLET | Freq: Every day | ORAL | 11 refills | Status: DC

## 2017-11-08 MED ORDER — FLUTICASONE-UMECLIDIN-VILANT 100-62.5-25 MCG/INH IN AEPB: 1.0000 | INHALATION_SPRAY | Freq: Every day | RESPIRATORY_TRACT | 11 refills | Status: DC

## 2017-11-08 NOTE — Assessment & Plan Note (Signed)
sats improved from last ov and now ok  on RA 11/08/2017 at rest but needs 2lpm hs and adjust with walking to keep over 90%/ advised

## 2017-11-08 NOTE — Patient Instructions (Addendum)
Ideally you should be on Trelegy each am if affordable otherwise go back to advair twice daily   When you exert like shopping take your meter and POC and adjust to keep above 90%   Please schedule a follow up office visit in 6 weeks, call sooner if needed with Dr Chase Caller

## 2017-11-08 NOTE — Progress Notes (Signed)
Subjective:     Patient ID: [name], female   DOB: 1946-05-31 .   MRN: [id number]     71 yowf quit smoking 2017 with GOLD III COPD criteria 10/26/16 and 02 dep with sleep/ exertion      10/10/2017 acute extended ov/[name] re: aecopd  Chief Complaint  Patient presents with  . Acute Visit    Increased SOB x wks. She also c/o cough with yellow sputum, wheezing and chest tightness.  She has been using her o2 3lpm 24/7. She had only been using 2lpm with sleep. She was 76% ra today b/c her tank ran out-increased to 89% 4lpm.    doing usual baseline for several months on  advair / proair prn sev times a day  for several  Week then gradually worse x "week" (very vague) with sob using neb 3-4 x daily and helps a lot for few hours had gone from just 2lpm at hs to 4lpm POC and still desats assoc with increasing cough / subj wheeze and gen chest tightness - comfortable at rest p neb  Also quite fatigued / feels weak when stands but po intake ok  rec Change 02 to 4lpm 24/7 for now Prednisone 10 mg take  4 each am x 2 days,   2 each am x 2 days,  1 each am x 2 days and stop  Change prilosec 40 mg  to where you take it Take 30- 60 min before your first and last meals of the day until better then back to one daily before breakfast stop lopressor altogether  Plan A = Automatic = Stop advair and start trelegy one each am  Plan B = Backup Only use your albuterol as a rescue medication  Plan C = Crisis - only use your albuterol/ipatropium nebulizer if you first try Plan B  For cough > mucinex dm 1200 mg every 12 hours and use the flutter valve as much as possible and if still coughing then take tylenol #3 one every 4 hours as needed Please schedule a follow up office visit in 4 weeks, sooner if needed  with all medications /inhalers/ solutions in hand so we can verify exactly what you are taking. This includes all medications from all doctors and over the counters - to see [name] or one of our  NPs    11/08/2017  f/u ov/[name] re: f/u copd exac  GOLD III criteria/ 02 dep hs and ex  Chief Complaint  Patient presents with  . Follow-up    Breathing is unchanged. She states she is no longer coughing or wheezing.   doe = MMRC3 = can't walk 100 yards even at a slow pace at a flat grade s stopping due to sob and not using POC with ex or monitoring sats with activity at all at this point  Not needing any albuterol at all  Since  last ov  Sleeping on 2 lpm / propped up on sofa x 2 years but without noct flare of cough or wheeze  No obvious day to day or daytime variability or assoc excess/ purulent sputum or mucus plugs or hemoptysis or cp or chest tightness, subjective wheeze or overt sinus or hb symptoms. No unusual exposure hx or h/o childhood pna/ asthma or knowledge of premature birth.  Sleeping ok propped up on couch on 2 lpm  without nocturnal  or early am exacerbation  of respiratory  c/o's or need for noct saba. Also denies any obvious fluctuation of symptoms  with weather or environmental changes or other aggravating or alleviating factors except as outlined above   Current Allergies, Complete Past Medical History, Past Surgical History, Family History, and Social History were reviewed in [employer] record.  ROS  The following are not active complaints unless bolded Hoarseness, sore throat, dysphagia, dental problems, itching, sneezing,  nasal congestion or discharge of excess mucus or purulent secretions, ear ache,   fever, chills, sweats, unintended wt loss or wt gain, classically pleuritic or exertional cp,  orthopnea pnd or leg swelling, presyncope, palpitations, abdominal pain, anorexia, nausea, vomiting, diarrhea  or change in bowel habits or change in bladder habits, change in stools or change in urine, dysuria, hematuria,  rash, arthralgias, visual complaints, headache, numbness, weakness or ataxia or problems with walking or coordination,  change in  mood/affect or memory.        Current Meds  Medication Sig  . albuterol (PROVENTIL HFA;VENTOLIN HFA) 108 (90 Base) MCG/ACT inhaler Inhale 2 puffs into the lungs every 4 (four) hours as needed for wheezing. As a RESCUE medication  . amitriptyline (ELAVIL) 50 MG tablet Take 1 tablet (50 mg total) by mouth daily.  Marland Kitchen aspirin 81 MG tablet Take 81 mg by mouth every evening.   Marland Kitchen atorvastatin (LIPITOR) 80 MG tablet TAKE 1 TABLET EVERY DAY. PLEASE KEEP UPCOMING APPT.THANK YOU  . Biotin 1000 MCG tablet Take 1,000 mg by mouth daily.  . bisacodyl (DULCOLAX) 5 MG EC tablet Take 1 tablet (5 mg total) by mouth daily as needed for moderate constipation. (Patient taking differently: Take 5 mg by mouth daily. )  . cilostazol (PLETAL) 100 MG tablet TAKE 1 TABLET (100 MG TOTAL) BY MOUTH 2 (TWO) TIMES DAILY.  . fluticasone (FLONASE) 50 MCG/ACT nasal spray USE 2 SPRAYS IN BOTH NOSTRILS DAILY.  Marland Kitchen Fluticasone-Umeclidin-Vilant (TRELEGY ELLIPTA) 100-62.5-25 MCG/INH AEPB Inhale 1 puff into the lungs daily.  Marland Kitchen guaiFENesin (MUCINEX) 600 MG 12 hr tablet Take 600 mg by mouth 2 (two) times daily as needed.   Marland Kitchen ipratropium-albuterol (DUONEB) 0.5-2.5 (3) MG/3ML SOLN USE 1 NEB 3 TIMES DAILY FOR 3 DAYS, THEN EVERY 6 HOURS AS NEEDED.  Marland Kitchen IRON PO Take 85 mg by mouth daily.  Marland Kitchen levothyroxine (SYNTHROID) 112 MCG tablet Take 1 tablet (112 mcg total) by mouth daily before breakfast.  . LORazepam (ATIVAN) 2 MG tablet TAKE 1/2-1 TABLET BY MOUTH AT BEDTIME FOR ANXIETY prn  . omeprazole (PRILOSEC) 40 MG capsule TAKE 1 CAPSULE (40 MG TOTAL) BY MOUTH 2 (TWO) TIMES DAILY. (Patient taking differently: Take 40 mg by mouth daily. )  . OXYGEN Inhale 2 L into the lungs at bedtime.  . traZODone (DESYREL) 50 MG tablet Take 0.5-1 tablets (25-50 mg total) by mouth at bedtime as needed for sleep.  . [                   Objective:   Physical Exam     amb wf nad  11/08/2017       151  Wt Readings from Last 3 Encounters:  10/10/17 150 lb  12.8 oz (68.4 kg)  10/04/17 154 lb (69.9 kg)  09/09/17 162 lb (73.5 kg)    Vital signs reviewed - Note on arrival 02 sats  93% on RA      HEENT: nl dentition, turbinates bilaterally, and oropharynx. Nl external ear canals without cough reflex   NECK :  without JVD/Nodes/TM/ nl carotid upstrokes bilaterally   LUNGS: no acc muscle use,  Mod  barrel chest with distant bs bilaterally but no active wheeze/ hyper resonant to percussion    CV:  RRR  no s3 or murmur or increase in P2, and no edema   ABD:  soft and nontender with mid insp hoover's sign in the supine position. No bruits or organomegaly appreciated, bowel sounds nl  MS:  Nl gait/ ext warm without deformities, calf tenderness, cyanosis or clubbing No obvious joint restrictions   SKIN: warm and dry without lesions    NEURO:  alert, approp, nl sensorium with  no motor or cerebellar deficits apparent.          Assessment:

## 2017-11-08 NOTE — Assessment & Plan Note (Addendum)
Spirometry 11/08/2017  FEV1 1.03 (44%)  Ratio 42 with 22% improvement p saba   Changed to trelegy 10/10/2017  - 11/08/2017  After extensive coaching inhaler device  effectiveness =    90% with DPI > continue trelegy if insurance covers - resumed daliresp 11/08/2017    I had an extended discussion with the patient reviewing all relevant studies completed to date and  lasting 15 to 20 minutes of a 25 minute visit on the following ongoing concerns:   1) Formulary restrictions will be an ongoing challenge for the forseable future and I would be happy to pick an alternative if the pt will first  provide me a list of them but pt  will need to return here for training for any new device that is required eg dpi vs hfa vs respimat.    In meantime we can always provide samples so the patient never runs out of any needed respiratory medications.   2) Clearly   Group D in terms of symptom/risk and laba/lama/ICS  therefore appropriate rx at this point so other options for trelegy are incruse plus bevespi or symb plus spiriva   3) tol daliresp in past so rec resume rx   4) Each maintenance medication was reviewed in detail including most importantly the difference between maintenance and as needed and under what circumstances the prns are to be used.  Please see AVS for specific  Instructions which are unique to this visit and I personally typed out  which were reviewed in detail in writing with the patient and a copy provided.

## 2017-11-14 ENCOUNTER — Inpatient Hospital Stay: Payer: Medicare Other | Attending: Oncology

## 2017-11-14 DIAGNOSIS — Z853 Personal history of malignant neoplasm of breast: Secondary | ICD-10-CM | POA: Diagnosis not present

## 2017-11-14 DIAGNOSIS — D473 Essential (hemorrhagic) thrombocythemia: Secondary | ICD-10-CM | POA: Insufficient documentation

## 2017-11-14 DIAGNOSIS — D75839 Thrombocytosis, unspecified: Secondary | ICD-10-CM

## 2017-11-14 DIAGNOSIS — C50911 Malignant neoplasm of unspecified site of right female breast: Secondary | ICD-10-CM

## 2017-11-14 DIAGNOSIS — Z170 Estrogen receptor positive status [ER+]: Secondary | ICD-10-CM

## 2017-11-14 LAB — C-REACTIVE PROTEIN: CRP: 1.6 mg/dL — ABNORMAL HIGH (ref <1.0)

## 2017-11-14 LAB — COMPREHENSIVE METABOLIC PANEL
ALT: 17 U/L (ref 0–55)
AST: 18 U/L (ref 5–34)
Albumin: 3.5 g/dL (ref 3.5–5.0)
Alkaline Phosphatase: 95 U/L (ref 40–150)
Anion gap: 8 (ref 3–11)
BUN: 10 mg/dL (ref 7–26)
CO2: 25 mmol/L (ref 22–29)
Calcium: 9.2 mg/dL (ref 8.4–10.4)
Chloride: 106 mmol/L (ref 98–109)
Creatinine, Ser: 0.82 mg/dL (ref 0.60–1.10)
GFR calc Af Amer: >60 mL/min (ref >60)
GFR calc non Af Amer: >60 mL/min (ref >60)
Glucose, Bld: 161 mg/dL — ABNORMAL HIGH (ref 70–140)
Potassium: 4.5 mmol/L (ref 3.5–5.1)
Sodium: 139 mmol/L (ref 136–145)
Total Bilirubin: 0.5 mg/dL (ref 0.2–1.2)
Total Protein: 7.1 g/dL (ref 6.4–8.3)

## 2017-11-14 LAB — CBC WITH DIFFERENTIAL/PLATELET
Basophils Absolute: 0 10*3/uL (ref 0.0–0.1)
Basophils Relative: 1 %
Eosinophils Absolute: 0.3 10*3/uL (ref 0.0–0.5)
Eosinophils Relative: 6 %
HCT: 44.5 % (ref 34.8–46.6)
Hemoglobin: 14.6 g/dL (ref 11.6–15.9)
Lymphocytes Relative: 22 %
Lymphs Abs: 1.2 10*3/uL (ref 0.9–3.3)
MCH: 29.6 pg (ref 25.1–34.0)
MCHC: 32.8 g/dL (ref 31.5–36.0)
MCV: 90.3 fL (ref 79.5–101.0)
Monocytes Absolute: 0.5 10*3/uL (ref 0.1–0.9)
Monocytes Relative: 9 %
Neutro Abs: 3.3 10*3/uL (ref 1.5–6.5)
Neutrophils Relative %: 62 %
Platelets: 345 10*3/uL (ref 145–400)
RBC: 4.93 MIL/uL (ref 3.70–5.45)
RDW: 13.9 % (ref 11.2–14.5)
WBC: 5.3 10*3/uL (ref 3.9–10.3)

## 2017-11-21 ENCOUNTER — Encounter: Payer: Self-pay | Admitting: Adult Health

## 2017-11-21 ENCOUNTER — Telehealth: Payer: Self-pay | Admitting: Adult Health

## 2017-11-21 ENCOUNTER — Inpatient Hospital Stay: Payer: Medicare Other | Attending: Oncology | Admitting: Adult Health

## 2017-11-21 VITALS — BP 124/84 | HR 74 | Temp 97.9°F | Resp 17 | Ht 66.0 in | Wt 155.8 lb

## 2017-11-21 DIAGNOSIS — Z1239 Encounter for other screening for malignant neoplasm of breast: Secondary | ICD-10-CM

## 2017-11-21 DIAGNOSIS — Z170 Estrogen receptor positive status [ER+]: Secondary | ICD-10-CM

## 2017-11-21 DIAGNOSIS — D473 Essential (hemorrhagic) thrombocythemia: Secondary | ICD-10-CM | POA: Insufficient documentation

## 2017-11-21 DIAGNOSIS — C50811 Malignant neoplasm of overlapping sites of right female breast: Secondary | ICD-10-CM

## 2017-11-21 DIAGNOSIS — Z853 Personal history of malignant neoplasm of breast: Secondary | ICD-10-CM | POA: Diagnosis not present

## 2017-11-21 NOTE — Progress Notes (Signed)
CLINIC:  Survivorship   REASON FOR VISIT:  Routine follow-up for history of breast cancer.   BRIEF ONCOLOGIC HISTORY:  Per Dr. Jana Hakim note from 06/21/2017:  [name] woman: history of right breast cancer: pT1c pN0, stage IA, estrogen and progesterone receptor positive.  1. S/p right mastectomy with myocutaneous flap 09/19/2009  2. Adjuvant chemo with 4 cycles of  Taxotere/  Cytoxan  3. Femara, followed by Arimidex, started roughly Jan 2011, with a few months' interruption  4.  Completed 5 years of antiestrogen therapy June 2016  5.  Also s/p left breast lumpectomy in may 2014 ,negative for atypia or malignancy  6. Intermittent thrombocytosis, mild likely multifocal factorial (borderline iron deficiency, positive ANA and elevated C-reactive protein)   INTERVAL HISTORY:  [name] presents to the Survivorship [facility] today for routine follow-up for her history of breast cancer.  Overall, she reports feeling quite well. She says that she finally got her rash figured out.  She notes that she stopped taking one of her arthritis medications (she cannot recall the name) and is doing much better.  She feels well today. She denies any issues.  She is exercising as much as she can.  She did have pneumonia last year and her lungs are recovering.  Due to this, she cannot walk outside during the winter months.      REVIEW OF SYSTEMS:  Review of Systems  Constitutional: Negative for appetite change, chills, fatigue, fever and unexpected weight change.  HENT:   Negative for hearing loss, lump/mass, mouth sores, nosebleeds and trouble swallowing.   Eyes: Negative for eye problems and icterus.  Respiratory: Negative for chest tightness, cough and shortness of breath.   Cardiovascular: Negative for chest pain, leg swelling and palpitations.  Gastrointestinal: Negative for abdominal distention, abdominal pain, constipation, diarrhea, nausea and vomiting.  Endocrine: Negative for hot  flashes.  Musculoskeletal: Negative for arthralgias.  Skin: Negative for itching and rash.  Neurological: Negative for dizziness, extremity weakness and numbness.  Hematological: Negative for adenopathy. Does not bruise/bleed easily.  Psychiatric/Behavioral: Negative for depression. The patient is not nervous/anxious.   Breast: Denies any new nodularity, masses, tenderness, nipple changes, or nipple discharge.       PAST MEDICAL/SURGICAL HISTORY:  Past Medical History:  Diagnosis Date  . Cancer ([place]) 2010   invasive ductal breast cancer; on tamoxifen  . Complication of anesthesia    was in ICU after lung surgery 2012  . COPD (chronic obstructive pulmonary disease) ([place])   . Diverticulitis    required colcectomy  . Emphysema of lung ([place])   . Fibromyalgia 1995  . GERD (gastroesophageal reflux disease)   . Hearing loss of both ears    uses hearing aids  bilaterally  . Hx of colonic polyps    Dr. Cristina Gong -last study '11  . Hyperlipidemia   . Hypertension   . Hypothyroidism   . Osteoporosis   . Peripheral vascular disease [facility]) May '12 - CT angio   SMA chronic occlusion; stenosis of celiac trunk.  . Shortness of breath dyspnea   . Varicose veins of leg with swelling    varicose vein surgery - Dr. Aleda Grana  . Wears glasses    Past Surgical History:  Procedure Laterality Date  . ABDOMINAL HYSTERECTOMY  1973   metorrhagia  . APPENDECTOMY    . BREAST SURGERY  2010   mastectomy with reconstructive surgery  . BREAST SURGERY  2010   rt lump x2  . BREAST SURGERY  2010  partial rt br reduction  . CARDIAC CATHETERIZATION  '11   no obstructive coronary disease  . COLECTOMY  2002   sigmoid  . COLONOSCOPY W/ POLYPECTOMY    . LUNG SURGERY  9/12   rt mid lung wedge  . PARTIAL MASTECTOMY WITH NEEDLE LOCALIZATION Left 02/23/2013   Procedure:  LEFT PARTIAL MASTECTOMY WITH NEEDLE LOCALIZATION;  Surgeon: Adin Hector, MD;  Location: [place];  Service:  General;  Laterality: Left;  . [place]   right upper lobe of lung as part of VAT-nodule/benign     ALLERGIES:  Allergies  Allergen Reactions  . Sulfonamide Derivatives Swelling    Swelling in face     CURRENT MEDICATIONS:  Outpatient Encounter Medications as of 11/21/2017  Medication Sig  . albuterol (PROVENTIL HFA;VENTOLIN HFA) 108 (90 Base) MCG/ACT inhaler Inhale 2 puffs into the lungs every 4 (four) hours as needed for wheezing. As a RESCUE medication  . amitriptyline (ELAVIL) 50 MG tablet Take 1 tablet (50 mg total) by mouth daily.  Marland Kitchen aspirin 81 MG tablet Take 81 mg by mouth every evening.   Marland Kitchen atorvastatin (LIPITOR) 80 MG tablet TAKE 1 TABLET EVERY DAY. PLEASE KEEP UPCOMING APPT.THANK YOU  . Biotin 1000 MCG tablet Take 1,000 mg by mouth daily.  . bisacodyl (DULCOLAX) 5 MG EC tablet Take 1 tablet (5 mg total) by mouth daily as needed for moderate constipation. (Patient taking differently: Take 5 mg by mouth daily. )  . cilostazol (PLETAL) 100 MG tablet TAKE 1 TABLET (100 MG TOTAL) BY MOUTH 2 (TWO) TIMES DAILY.  . fluticasone (FLONASE) 50 MCG/ACT nasal spray USE 2 SPRAYS IN BOTH NOSTRILS DAILY.  Marland Kitchen Fluticasone-Umeclidin-Vilant (TRELEGY ELLIPTA) 100-62.5-25 MCG/INH AEPB Inhale 1 puff into the lungs daily.  . Fluticasone-Umeclidin-Vilant (TRELEGY ELLIPTA) 100-62.5-25 MCG/INH AEPB Inhale 1 puff into the lungs daily.  Marland Kitchen guaiFENesin (MUCINEX) 600 MG 12 hr tablet Take 600 mg by mouth 2 (two) times daily as needed.   Marland Kitchen ipratropium-albuterol (DUONEB) 0.5-2.5 (3) MG/3ML SOLN USE 1 NEB 3 TIMES DAILY FOR 3 DAYS, THEN EVERY 6 HOURS AS NEEDED.  Marland Kitchen IRON PO Take 85 mg by mouth daily.  Marland Kitchen levothyroxine (SYNTHROID) 112 MCG tablet Take 1 tablet (112 mcg total) by mouth daily before breakfast.  . LORazepam (ATIVAN) 2 MG tablet TAKE 1/2-1 TABLET BY MOUTH AT BEDTIME FOR ANXIETY prn  . omeprazole (PRILOSEC) 40 MG capsule TAKE 1 CAPSULE (40 MG TOTAL) BY MOUTH 2 (TWO) TIMES DAILY. (Patient taking  differently: Take 40 mg by mouth daily. )  . OXYGEN Inhale 2 L into the lungs at bedtime.  . roflumilast (DALIRESP) 500 MCG TABS tablet Take 1 tablet (500 mcg total) by mouth daily.  . traZODone (DESYREL) 50 MG tablet Take 0.5-1 tablets (25-50 mg total) by mouth at bedtime as needed for sleep.   No facility-administered encounter medications on file as of 11/21/2017.      ONCOLOGIC FAMILY HISTORY:  Family History  Problem Relation Age of Onset  . Colon cancer Mother        colon  . COPD Mother        brown lung  . Hypertension Mother   . Diabetes Mother   . Emphysema Mother   . Coronary artery disease Father   . Heart attack Father   . Sudden death Father   . Heart disease Father   . Cancer Sister        fallopian tube  . Hypertension Sister   .  Coronary artery disease Brother   . Throat cancer Sister   . Melanoma Sister   . Hypertension Sister   . Pancreatic cancer Sister   . Cancer Sister   . Heart attack Brother        early 22's  . Heart failure Sister     GENETIC COUNSELING/TESTING: Not at this time  SOCIAL HISTORY:  [name] is married and lives with her husband in [place], [place].  She has 2 children who live one with her, and one a few miles away.  Ms. Dufault is currently retired.  She denies any current or history of tobacco, alcohol, or illicit drug use.     PHYSICAL EXAMINATION:  Vital Signs: Vitals:   11/21/17 1052  BP: 124/84  Pulse: 74  Resp: 17  Temp: 97.9 F (36.6 C)  SpO2: 94%   Filed Weights   11/21/17 1052  Weight: 155 lb 12.8 oz (70.7 kg)   General: Well-nourished, well-appearing female in no acute distress.  Unaccompanied today.   HEENT: Head is normocephalic.  Pupils equal and reactive to light. Conjunctivae clear without exudate.  Sclerae anicteric. Oral mucosa is pink, moist.  Oropharynx is pink without lesions or erythema.  Lymph: No cervical, supraclavicular, or infraclavicular lymphadenopathy noted on palpation.    Cardiovascular: Regular rate and rhythm.Marland Kitchen Respiratory: Clear to auscultation bilaterally. Chest expansion symmetric; breathing non-labored.  Breast Exam:  -Left breast: No appreciable masses on palpation. No skin redness, thickening, or peau d'orange appearance; no nipple retraction or nipple discharge; mild distortion in symmetry at previous lumpectomy site well healed scar without erythema or nodularity.  -Right breast: s/p mastecomy and reconstruction, well healed, no swelling or sign of recurrence. -Axilla: No axillary adenopathy bilaterally.  GI: Abdomen soft and round; non-tender, non-distended. Bowel sounds normoactive. No hepatosplenomegaly.   GU: Deferred.  Neuro: No focal deficits. Steady gait.  Psych: Mood and affect normal and appropriate for situation.  MSK: No focal spinal tenderness to palpation, full range of motion in bilateral upper extremities Extremities: No edema. Skin: Warm and dry.  LABORATORY DATA:  None for this visit   DIAGNOSTIC IMAGING:  Most recent MRI was normal.      ASSESSMENT AND PLAN:  Ms.. Rasch is a pleasant 72 y.o. female with history of Stage IA right breast invasive ductal carcinoma, ER+/PR+/HER2-, diagnosed in 2010, treated with mastectomy, adjuvant chemotherapy, Femara/Arimidex x 5 years completed in 03/2015.  Of note she is s/p left lumpectomy in 02/2013 neg for atypia or malignancy.  She also has intermittent thrombocytosis.  She presents to the Survivorship Clinic for surveillance and routine follow-up.   1. History of breast cancer:  Ms. Entwistle is currently clinically and radiographically without evidence of disease or recurrence of breast cancer. She will be due for mammogram in 06/2018; orders placed today.   She will return to see me in one year for repeat long term follow up.  I encouraged her to call me with any questions or concerns before her next visit at the cancer center, and I would be happy to see her sooner, if needed.    2. History  of intermittent thrombocytosis: stable today.  I can see her Plt counts that her PCP does, and can follow these easily.  She is taking iron daily and tolerates it well.  Her CRP is also decreased today.  I reviewed these results with her in detail.    3. Bone health:  Given Ms. Allers's age, history of breast cancer,  and her previous anti-estrogen therapy with aromatase inhibitors, she is at risk for bone demineralization. I will defer to her PCP regarding bone density testing and management.  She was given education on specific food and activities to promote bone health.  4. Cancer screening:  Due to Ms. Rueb's history and her age, she should receive screening for skin cancers, colon cancer. She was encouraged to follow-up with her PCP for appropriate cancer screenings.   5. Health maintenance and wellness promotion: Ms. Rabon was encouraged to consume 5-7 servings of fruits and vegetables per day. She was also encouraged to engage in moderate to vigorous exercise for 30 minutes per day most days of the week. She was instructed to limit her alcohol consumption and continue to abstain from tobacco use.     Dispo:  -Return to cancer center in one year for LTS follow up -Mammogram in 06/2018   A total of (30) minutes of face-to-face time was spent with this patient with greater than 50% of that time in counseling and care-coordination.   Gardenia Phlegm, NP Survivorship Program [facility] [phone]   Note: PRIMARY CARE PROVIDER [place], [name], [place] [phone]

## 2017-11-21 NOTE — Telephone Encounter (Signed)
Gave patient AVs and calendar of upcoming February 2020 appointments.

## 2017-11-22 DIAGNOSIS — J449 Chronic obstructive pulmonary disease, unspecified: Secondary | ICD-10-CM | POA: Diagnosis not present

## 2017-12-03 ENCOUNTER — Other Ambulatory Visit: Payer: Self-pay | Admitting: Family Medicine

## 2017-12-03 ENCOUNTER — Other Ambulatory Visit: Payer: Self-pay | Admitting: Internal Medicine

## 2017-12-03 ENCOUNTER — Other Ambulatory Visit: Payer: Self-pay | Admitting: Nurse Practitioner

## 2017-12-03 NOTE — Telephone Encounter (Signed)
Routing to dr [place], patient's pcp to handle

## 2017-12-04 DIAGNOSIS — M1812 Unilateral primary osteoarthritis of first carpometacarpal joint, left hand: Secondary | ICD-10-CM | POA: Diagnosis not present

## 2017-12-04 DIAGNOSIS — M79642 Pain in left hand: Secondary | ICD-10-CM | POA: Diagnosis not present

## 2017-12-16 ENCOUNTER — Other Ambulatory Visit: Payer: Self-pay | Admitting: *Deleted

## 2017-12-16 DIAGNOSIS — E039 Hypothyroidism, unspecified: Secondary | ICD-10-CM

## 2017-12-16 MED ORDER — LEVOTHYROXINE SODIUM 112 MCG PO TABS: 112.0000 ug | ORAL_TABLET | Freq: Every day | ORAL | 1 refills | Status: DC

## 2017-12-20 ENCOUNTER — Ambulatory Visit: Payer: Medicare Other | Admitting: Internal Medicine

## 2017-12-20 ENCOUNTER — Encounter: Payer: Self-pay | Admitting: Internal Medicine

## 2017-12-20 VITALS — BP 138/62 | HR 94 | Ht 66.0 in | Wt 151.2 lb

## 2017-12-20 DIAGNOSIS — J449 Chronic obstructive pulmonary disease, unspecified: Secondary | ICD-10-CM

## 2017-12-20 MED ORDER — ROFLUMILAST 250 MCG PO TABS: 250.0000 ug | ORAL_TABLET | Freq: Every day | ORAL | 0 refills | Status: DC

## 2017-12-20 MED ORDER — ROFLUMILAST 500 MCG PO TABS: 500.0000 ug | ORAL_TABLET | Freq: Every day | ORAL | 11 refills | Status: DC

## 2017-12-20 NOTE — Patient Instructions (Addendum)
ICD-10-CM   1. Stage 3 severe COPD by GOLD classification ([place]) J44.9      Stable disease Too bad daliresp was expensive due to donut hole issues  Plan Will tr sample daliresp 250mg  daily for first month and then prescription 500mg  daily  - might not be expensive this early Continue trelegy, duoneb, flonase  And o2 as before  Followup 6 months or sooner if needed

## 2017-12-20 NOTE — Progress Notes (Signed)
Subjective:     Patient ID: [name], female   DOB: Apr 01, 1946, 72 y.o.   MRN: [id number]  HPI    PCP Hoyt Koch, MD  OV 12/31/2016  Chief Complaint  Patient presents with  . Follow-up    Pt states her breahting is doing well. Pt denies cough and CP/tightness. Pt c/o acid reflux.      Follow-up Gold stage III COPD  She has recurrent exacerbation. Most recently was over a month ago when she was hospitalized. She currently feels baseline. She feels good. She is not on oxygen therapy. Reviewed her medication list and shows that she is only on Advair as control therapy for COPD. She has no idea why she is not on an anticholinergic. DuoNeb was listed but she says she only takes it sparingly as needed and since her discharge is not needing it. She has normal work and anticholinergic is. She also tells me that because of her eosinophilia and elevated IgE and seasonal allergies wants a referral to allergy clinic. She is seeing Dr. Allyson Sabal for her skin issues and she is feeling better.         Results for Christy, [name] (MRN [id number]) as of 12/31/2016 12:33  Ref. Range 07/04/2016 05:26 07/05/2016 03:23 07/10/2016 05:13 11/16/2016 13:20 11/17/2016 05:57  Eosinophils Absolute Latest Ref Range: 0.0 - 0.7 K/uL    1.6 (H) 0.0    Results for Christy, [name] (MRN [id number]) as of 12/31/2016 12:33  Ref. Range 11/16/2016 13:20  IgE (Immunoglobulin E), Serum Latest Ref Range: 0 - 100 IU/mL 180 (H)    OV 02/26/2017  Chief Complaint  Patient presents with  . Follow-up    FOLLOW UP FOR patient states that she is here for SOB and her voice is horse and she has wheezing all the time patient denies c/p or tightness     Follow-up Gold stage III COPD: She is on triple inhaler therapy with Spiriva and Advair. Has COPD stable. She has elevated IgE and eosinophilia. She has seen allergist and then got referred to Dr. Lucia Gaskins ENT who apparently treated her for pharyngeal or laryngeal thrush and this has  helped. She also has skin rash. I referred her to Dr. Allyson Sabal and she's tried some prednisone course and has had skin biopsy with these things have not helped. Nevertheless she feels she is under good care. Overall in terms of COPD she stable but she does have significant cough and is a history of frequent exacerbations. But she does not think she is an exacerbation today  The new issue appears to be that she has last few days she has right lower extremity swelling greater than the left. There is some associated warmth    03/07/2017 Follow UP OV for Chronic DVT:  Pt. Was seen by Dr. Chase Caller 02/26/2017 for leg swelling. He was concerned about possibility of acute DVT and ordered a doppler study. The patient did not have any chest pain or shortness of breath at the time. 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 Dr. Chase Caller chose not to treat as  this is a chronic problem. He wanted the patient to be diligent with her ASA daily.The patient returns today for follow up. She continues to have no respiratory issues. Her COPD is stable and at baseline. She does  not have any chest pain, or tachycardia. She states her baseline dyspnea that she experiences with her COPD  is unchanged. She continues to have  issues with bilateral leg swelling.She is not wearing her oxygen with exertion as prescribed . She states she is wearing it at night. We did discuss the additional strain  placed on the heart when oxygen levels fall below 88%, and the importance of compliance with oxygen on exertion. Most recent hospital admission for COPD exacerbation 11/2016. She was prophylaxed with heparin for DVT prophylaxis at that time.    OV 06/04/2017  Chief Complaint  Patient presents with  . Follow-up    Pt states her SOB has recently worsened x 1 week. Pt c/o prod cough with opaque colored mucus. Pt denies CP/tightness and f/c/s.    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 Here with her husband. She initially told me that she was stable and doing well without exacerbations but has a continued to talk to her she initially told me that she's been coughing more than usual for past one day but then has been interjected saying she's been coughing more than usual for a few weeks. She agreed to this. She admitted that the sputum is a little bit more discolored than baseline. She is also more short of breath and wheezing more and coughing more than baseline.  Her recent months have been complicated by chronic DVT finding for which she says she is seeing vascular surgery Dr. Adele Barthel and his been placed on observation therapy according to history. She's also had cellulitis issues with her feet. She showed me treatment medication of clindamycin and cephalexin at [facility] and was to hospital in the middle of June 2018. She says for the last few months she's been having excessive hair loss and she's going to see dermatology for this. She's wondering if antibiotic and prednisone and contributing to hair loss. At the same time she is worried about recurrent COPD exacerbation and leaving it untreated.      07/15/2017 Acute OV:  [name] is a 72 y.o. female former smoker with COPD Gold III and history of breast cancer.She is followed by Dr. Chase Caller.  Pt. Presents for acute visit with a 2 week history of worsening shortness of breath, cough, increased wheezing. She endorses tightness in her chest. She is coughing up clear foamy white secretions. No significant increase in amount of secretions.She states she has not had a fever. She is compliant with her Advair and Spiriva daily. She prefers Trelegy, but cannot afford it. She is compliant with her Duonebs,  Mucinex, Flonase and has had increased use in frequency of need for rescue inhaler. She is currently on oxygen nocturnally. She has been checking her  saturations, and has found that her saturations during the day have dropped as low as the 70's. She was at 86% today. We have placed her on oxygen at 2 L . She has rebounded to 92%.She denies fever, chest pain, or hemoptysis. She states she has been sleeping propped on 2 pillows for months.  Test Results:   OV 09/09/2017  Chief Complaint  Patient presents with  . Follow-up    Pt states that she has been doing good. States that she started coughing with thick mucus x1 week ago. Also states that she has SOB with exertion. Denies any CP.    Gold stage III COPD with a history of breast cancer.  She has frequent exacerbations.  Last August 2018.  Then after that in October 2018 again had an  exacerbation and so my nurse practitioner.  She tells me that she is taking different inhalers because of cost issues although her best preferred inhalers trelegy..  Since that she has quit smoking.  She is very worried about her hair loss and has an appoint with Dr. Allyson Sabal.  She says that she saw a dermatologist at [facility] and was recommended being in a tanning bed for 30 days but this is not helped.  She is looking forward to seeing Dr. Allyson Sabal.  Currently feels COPD stable with a COPD CAT score of 17 but she does have mild for secretory wheeze.  She is extremely interested in [place] for prevention of COPD exacerbations.  OV 12/20/2017  Chief Complaint  Patient presents with  . Follow-up    copd no SOB, no wheezing, no chest tightness   Severe copd = MM Phenotype  Gold stage III COPD with history of breast cancer.  She has frequent exacerbations.  Last seen Thanksgiving 2018.  At that time I started her on Daliresp.  Since then she is only had one exacerbation.  I thought this was all because of Daliresp that her exacerbation frequency is reduced but today she tells me that she did take the first set of the drug and she liked it and it helped but then it was expensive and she has not really taken the  medication.  She did see Dr. Melvyn Novas my colleague in the interim and was given prednisone for another exacerbation.  COPD CAT score shows continued stability.  Overall there are no new problems.   Results for Christy, [name] (MRN [id number]) as of 12/20/2017 13:00  Ref. Range 08/16/2016 14:35 10/26/2016 07:04  FEV1-Post Latest Units: L 1.37 1.03  FEV1-%Pred-Post Latest Units: % 56 44      CAT COPD Symptom & Quality of Life Score ([place]) 0 is no burden. 5 is highest burden 09/09/2017   Never Cough -> Cough all the time 3  No phlegm in chest -> Chest is full of phlegm 3  No chest tightness -> Chest feels very tight 3  No dyspnea for 1 flight stairs/hill -> Very dyspneic for 1 flight of stairs 0  No limitations for ADL at home -> Very limited with ADL at home 0  Confident leaving home -> Not at all confident leaving home 0  Sleep soundly -> Do not sleep soundly because of lung condition 5  Lots of Energy -> No energy at all 3  TOTAL Score (max 40)  17      has a past medical history of Cancer ([place]) (9242), Complication of anesthesia, COPD (chronic obstructive pulmonary disease) ([place]), Diverticulitis, Emphysema of lung ([place]), Fibromyalgia (1995), GERD (gastroesophageal reflux disease), Hearing loss of both ears, colonic polyps, Hyperlipidemia, Hypertension, Hypothyroidism, Osteoporosis, Peripheral vascular disease ([place]) (May '12 - CT angio), Shortness of breath dyspnea, Varicose veins of leg with swelling, and Wears glasses.   reports that she quit smoking about 21 months ago. Her smoking use included cigarettes. She has a 37.50 pack-year smoking history. she has never used smokeless tobacco.  Past Surgical History:  Procedure Laterality Date  . ABDOMINAL HYSTERECTOMY  1973   metorrhagia  . APPENDECTOMY    . BREAST SURGERY  2010   mastectomy with reconstructive surgery  . BREAST SURGERY  2010   rt lump x2  . BREAST SURGERY  2010   partial rt br reduction  . CARDIAC CATHETERIZATION   '11   no obstructive coronary disease  .  COLECTOMY  2002   sigmoid  . COLONOSCOPY W/ POLYPECTOMY    . LUNG SURGERY  9/12   rt mid lung wedge  . PARTIAL MASTECTOMY WITH NEEDLE LOCALIZATION Left 02/23/2013   Procedure:  LEFT PARTIAL MASTECTOMY WITH NEEDLE LOCALIZATION;  Surgeon: Adin Hector, MD;  Location: [facility];  Service: General;  Laterality: Left;  . [place]   right upper lobe of lung as part of VAT-nodule/benign    Allergies  Allergen Reactions  . Sulfonamide Derivatives Swelling    Swelling in face    Immunization History  Administered Date(s) Administered  . Influenza, High Dose Seasonal PF 10/03/2017  . Influenza,inj,Quad PF,6+ Mos 08/10/2014, 08/12/2015, 07/03/2016  . Pneumococcal Conjugate-13 08/10/2014  . Pneumococcal Polysaccharide-23 08/12/2015  . Td 10/16/2007  . Zoster 11/09/2010    Family History  Problem Relation Age of Onset  . Colon cancer Mother        colon  . COPD Mother        brown lung  . Hypertension Mother   . Diabetes Mother   . Emphysema Mother   . Coronary artery disease Father   . Heart attack Father   . Sudden death Father   . Heart disease Father   . Cancer Sister        fallopian tube  . Hypertension Sister   . Coronary artery disease Brother   . Throat cancer Sister   . Melanoma Sister   . Hypertension Sister   . Pancreatic cancer Sister   . Cancer Sister   . Heart attack Brother        early 26's  . Heart failure Sister      Current Outpatient Medications:  .  albuterol (PROVENTIL HFA;VENTOLIN HFA) 108 (90 Base) MCG/ACT inhaler, Inhale 2 puffs into the lungs every 4 (four) hours as needed for wheezing. As a RESCUE medication, Disp: 1 Inhaler, Rfl: 1 .  amitriptyline (ELAVIL) 50 MG tablet, TAKE 1 TABLET BY MOUTH EVERY DAY., Disp: 90 tablet, Rfl: 0 .  aspirin 81 MG tablet, Take 81 mg by mouth every evening. , Disp: , Rfl:  .  atorvastatin (LIPITOR) 80 MG tablet, TAKE 1 TABLET EVERY DAY.  PLEASE KEEP UPCOMING APPT.THANK YOU, Disp: 90 tablet, Rfl: 3 .  Biotin 1000 MCG tablet, Take 1,000 mg by mouth daily., Disp: , Rfl:  .  bisacodyl (DULCOLAX) 5 MG EC tablet, Take 1 tablet (5 mg total) by mouth daily as needed for moderate constipation. (Patient taking differently: Take 5 mg by mouth daily. ), Disp: 30 tablet, Rfl: 11 .  cilostazol (PLETAL) 100 MG tablet, TAKE 1 TABLET (100 MG TOTAL) BY MOUTH 2 (TWO) TIMES DAILY., Disp: 180 tablet, Rfl: 2 .  fluticasone (FLONASE) 50 MCG/ACT nasal spray, USE 2 SPRAYS IN BOTH NOSTRILS DAILY., Disp: 16 g, Rfl: 4 .  Fluticasone-Umeclidin-Vilant (TRELEGY ELLIPTA) 100-62.5-25 MCG/INH AEPB, Inhale 1 puff into the lungs daily., Disp: 1 each, Rfl: 11 .  Fluticasone-Umeclidin-Vilant (TRELEGY ELLIPTA) 100-62.5-25 MCG/INH AEPB, Inhale 1 puff into the lungs daily., Disp: 14 each, Rfl: 0 .  guaiFENesin (MUCINEX) 600 MG 12 hr tablet, Take 600 mg by mouth 2 (two) times daily as needed. , Disp: , Rfl:  .  ipratropium-albuterol (DUONEB) 0.5-2.5 (3) MG/3ML SOLN, USE 1 NEB 3 TIMES DAILY FOR 3 DAYS, THEN EVERY 6 HOURS AS NEEDED., Disp: 360 mL, Rfl: 0 .  IRON PO, Take 85 mg by mouth daily., Disp: , Rfl:  .  levothyroxine (SYNTHROID) 112  MCG tablet, Take 1 tablet (112 mcg total) by mouth daily before breakfast., Disp: 90 tablet, Rfl: 1 .  LORazepam (ATIVAN) 2 MG tablet, TAKE 1/2-1 TABLET BY MOUTH AT BEDTIME FOR ANXIETY prn, Disp: 30 tablet, Rfl: 2 .  omeprazole (PRILOSEC) 40 MG capsule, TAKE 1 CAPSULE (40 MG TOTAL) BY MOUTH 2 (TWO) TIMES DAILY. (Patient taking differently: Take 40 mg by mouth daily. ), Disp: 180 capsule, Rfl: 3 .  OXYGEN, Inhale 2 L into the lungs at bedtime., Disp: , Rfl:  .  roflumilast (DALIRESP) 500 MCG TABS tablet, Take 1 tablet (500 mcg total) by mouth daily., Disp: 30 tablet, Rfl: 11 .  traZODone (DESYREL) 50 MG tablet, Take 0.5-1 tablets (25-50 mg total) by mouth at bedtime as needed for sleep., Disp: 30 tablet, Rfl: 2   Review of Systems      Objective:   Physical Exam  Constitutional: She is oriented to person, place, and time. She appears well-developed and well-nourished. No distress.  HENT:  Head: Normocephalic and atraumatic.  Right Ear: External ear normal.  Left Ear: External ear normal.  Mouth/Throat: Oropharynx is clear and moist. No oropharyngeal exudate.  Eyes: Conjunctivae and EOM are normal. Pupils are equal, round, and reactive to light. Right eye exhibits no discharge. Left eye exhibits no discharge. No scleral icterus.  Neck: Normal range of motion. Neck supple. No JVD present. No tracheal deviation present. No thyromegaly present.  Cardiovascular: Normal rate, regular rhythm, normal heart sounds and intact distal pulses. Exam reveals no gallop and no friction rub.  No murmur heard. Pulmonary/Chest: Effort normal and breath sounds normal. No respiratory distress. She has no wheezes. She has no rales. She exhibits no tenderness.  Abdominal: Soft. Bowel sounds are normal. She exhibits no distension and no mass. There is no tenderness. There is no rebound and no guarding.  Musculoskeletal: Normal range of motion. She exhibits no edema or tenderness.  Lymphadenopathy:    She has no cervical adenopathy.  Neurological: She is alert and oriented to person, place, and time. She has normal reflexes. No cranial nerve deficit. She exhibits normal muscle tone. Coordination normal.  Skin: Skin is warm and dry. No rash noted. She is not diaphoretic. No erythema. No pallor.  Psychiatric: She has a normal mood and affect. Her behavior is normal. Judgment and thought content normal.  Vitals reviewed.  Vitals:   12/20/17 1234  BP: 138/62  Pulse: 94  SpO2: 93%  Weight: 151 lb 3.2 oz (68.6 kg)  Height: 5\' 6"  (1.676 m)    Estimated body mass index is 24.4 kg/m as calculated from the following:   Height as of this encounter: 5\' 6"  (1.676 m).   Weight as of this encounter: 151 lb 3.2 oz (68.6 kg).      Assessment:        ICD-10-CM   1. Stage 3 severe COPD by GOLD classification ([place]) J44.9        Plan:       Stable disease Too bad daliresp was expensive due to donut hole issues  Plan Will tr sample daliresp 250mg  daily for first month and then prescription 500mg  daily  - might not be expensive this early Continue trelegy, duoneb, flonase  And o2 as before  Followup 6 months or sooner if needed    Dr. Brand Males, M.D., [facility].C.P Pulmonary and Critical Care Medicine Staff Physician, [place] Director - Interstitial Lung Disease  Program  Pulmonary [place] at  [place], [place], [address]  Pager: [phone], If no answer or between  15:00h - 7:00h: call [phone] Telephone: [phone]

## 2017-12-26 ENCOUNTER — Other Ambulatory Visit: Payer: Self-pay | Admitting: Internal Medicine

## 2017-12-26 DIAGNOSIS — F419 Anxiety disorder, unspecified: Secondary | ICD-10-CM

## 2017-12-26 DIAGNOSIS — G4700 Insomnia, unspecified: Secondary | ICD-10-CM

## 2017-12-27 NOTE — Telephone Encounter (Signed)
Routing to PCP please advise. Thank you

## 2018-01-02 NOTE — Progress Notes (Signed)
HPI:   [name] is a 72 y.o. female, who is here today for 3 months follow up on insomnia and anxiety.  She was last seen on 10/04/17.  Since her last OV she has oncologist and pulmonologist.  Anxiety and insomnia, she is on Xanax 2 mg at bedtime and Trazodone respectively.  Last visit I recommended trying to decrease Xanax dose from 2 mg to 1 mg. She is currently on Trazodone 50 mg and takes Xanax 2 mg 1/2 tablet, she has been able to sleep better and feels more rested the next day.  She denies side effects. She is also taking Elavil 50 mg daily, which was started a few years ago to treat fibromyalgia.    Today she is complaining of a week of "fast heart rate", she has been monitoring heart rate and he has been 115-120/min. Also reporting chest tightness/pain, constant, exacerbated by doing chores around her house.  No associated diaphoresis or syncope. Pain is not radiated.  Symptoms seem to be worse in the morning, she has noted pulse ox as low as 65, her baseline is 88 at home (Hx of COPD).  She denies orthopnea, PND, or lower extremity edema. She has history of CAD, she follows with cardiologist every 6 months,  Last visit in 08/2017. Echo in 06/2016 LVEF 89-38%, grade 1 diastolic dysfunction.  She is also reporting improvement of COPD symptoms, she recently follow with pulmonologist, Dr. Chase Caller.  History of hypothyroidism, she follows with endocrinologist.  Currently she is on Synthroid 112 mcg daily.  Lab Results  Component Value Date   TSH 5.110 (H) 08/27/2017    She has history of GERD, still has heartburn at night, exacerbated by eating late at night. Currently she is on Omeprazole 40 mg twice daily. OTC antiacid medication helps some with chest discomfort.      Lab Results  Component Value Date   CREATININE 0.82 11/14/2017   BUN 10 11/14/2017   NA 139 11/14/2017   K 4.5 11/14/2017   CL 106 11/14/2017   CO2 25 11/14/2017    Today she is  also reporting episodes of gross hematuria about a week ago, "heavy bleeding." she has no noted vaginal bleeding or discharge. No history of nephrolithiasis. No associated urinary frequency, dysuria, urgency, pelvic pain, or back pain. She has no noted fever, chills, or worsening fatigue. Former smoker.  Random glucose 126 in 10/2017.  Review of Systems  Constitutional: Negative for activity change, appetite change, fatigue and fever.  HENT: Negative for mouth sores, nosebleeds and trouble swallowing.   Eyes: Negative for redness and visual disturbance.  Respiratory: Positive for chest tightness and shortness of breath (Improved). Negative for cough and wheezing.   Cardiovascular: Positive for chest pain and palpitations. Negative for leg swelling.  Gastrointestinal: Negative for abdominal pain, nausea and vomiting.       Negative for changes in bowel habits.  Endocrine: Negative for cold intolerance, heat intolerance, polydipsia, polyphagia and polyuria.  Genitourinary: Positive for hematuria. Negative for decreased urine volume, dysuria, vaginal bleeding and vaginal discharge.  Musculoskeletal: Negative for back pain and myalgias.  Neurological: Negative for syncope, weakness and headaches.  Psychiatric/Behavioral: Positive for sleep disturbance. Negative for confusion. The patient is nervous/anxious.     Current Outpatient Medications on File Prior to Visit  Medication Sig Dispense Refill  . albuterol (PROVENTIL HFA;VENTOLIN HFA) 108 (90 Base) MCG/ACT inhaler Inhale 2 puffs into the lungs every 4 (four) hours as needed for  wheezing. As a RESCUE medication 1 Inhaler 1  . amitriptyline (ELAVIL) 50 MG tablet TAKE 1 TABLET BY MOUTH EVERY DAY. 90 tablet 0  . aspirin 81 MG tablet Take 81 mg by mouth every evening.     Marland Kitchen atorvastatin (LIPITOR) 80 MG tablet TAKE 1 TABLET EVERY DAY. PLEASE KEEP UPCOMING APPT.THANK YOU 90 tablet 3  . Biotin 1000 MCG tablet Take 1,000 mg by mouth daily.    .  bisacodyl (DULCOLAX) 5 MG EC tablet Take 1 tablet (5 mg total) by mouth daily as needed for moderate constipation. (Patient taking differently: Take 5 mg by mouth daily. ) 30 tablet 11  . cilostazol (PLETAL) 100 MG tablet TAKE 1 TABLET (100 MG TOTAL) BY MOUTH 2 (TWO) TIMES DAILY. 180 tablet 2  . fluticasone (FLONASE) 50 MCG/ACT nasal spray USE 2 SPRAYS IN BOTH NOSTRILS DAILY. 16 g 4  . Fluticasone-Umeclidin-Vilant (TRELEGY ELLIPTA) 100-62.5-25 MCG/INH AEPB Inhale 1 puff into the lungs daily. 1 each 11  . guaiFENesin (MUCINEX) 600 MG 12 hr tablet Take 600 mg by mouth 2 (two) times daily as needed.     Marland Kitchen ipratropium-albuterol (DUONEB) 0.5-2.5 (3) MG/3ML SOLN USE 1 NEB 3 TIMES DAILY FOR 3 DAYS, THEN EVERY 6 HOURS AS NEEDED. 360 mL 0  . IRON PO Take 85 mg by mouth daily.    Marland Kitchen levothyroxine (SYNTHROID) 112 MCG tablet Take 1 tablet (112 mcg total) by mouth daily before breakfast. 90 tablet 1  . LORazepam (ATIVAN) 2 MG tablet TAKE 1 TABLET AT BEDTIME FOR ANXIETY. 30 tablet 1  . omeprazole (PRILOSEC) 40 MG capsule TAKE 1 CAPSULE (40 MG TOTAL) BY MOUTH 2 (TWO) TIMES DAILY. (Patient taking differently: Take 40 mg by mouth daily. ) 180 capsule 3  . OXYGEN Inhale 2 L into the lungs at bedtime.    . traZODone (DESYREL) 50 MG tablet Take 0.5-1 tablets (25-50 mg total) by mouth at bedtime as needed for sleep. 30 tablet 2  . roflumilast (DALIRESP) 500 MCG TABS tablet Take 1 tablet (500 mcg total) by mouth daily. (Patient not taking: Reported on 01/03/2018) 30 tablet 11  . roflumilast (DALIRESP) 500 MCG TABS tablet Take 1 tablet (500 mcg total) by mouth daily. (Patient not taking: Reported on 01/03/2018) 30 tablet 11   No current facility-administered medications on file prior to visit.      Past Medical History:  Diagnosis Date  . Cancer ([place]) 2010   invasive ductal breast cancer; on tamoxifen  . Complication of anesthesia    was in ICU after lung surgery 2012  . COPD (chronic obstructive pulmonary disease)  ([place])   . Diverticulitis    required colcectomy  . Emphysema of lung ([place])   . Fibromyalgia 1995  . GERD (gastroesophageal reflux disease)   . Hearing loss of both ears    uses hearing aids  bilaterally  . Hx of colonic polyps    Dr. Cristina Gong -last study '11  . Hyperlipidemia   . Hypertension   . Hypothyroidism   . Osteoporosis   . Peripheral vascular disease [facility]) May '12 - CT angio   SMA chronic occlusion; stenosis of celiac trunk.  . Shortness of breath dyspnea   . Varicose veins of leg with swelling    varicose vein surgery - Dr. Aleda Grana  . Wears glasses    Allergies  Allergen Reactions  . Sulfonamide Derivatives Swelling    Swelling in face    Social History   Socioeconomic History  . Marital status: Married  Spouse name: Not on file  . Number of children: 3  . Years of education: 46  . Highest education level: Not on file  Occupational History  . 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 Financial resource strain: Not on file  . Food insecurity:    Worry: Not on file    Inability: Not on file  . Transportation needs:    Medical: Not on file    Non-medical: Not on file  Tobacco Use  . Smoking status: Former Smoker    Packs/day: 0.75    Years: 50.00    Pack years: 37.50    Types: Cigarettes    Last attempt to quit: 03/18/2016    Years since quitting: 1.7  . Smokeless tobacco: Never Used  Substance and Sexual Activity  . Alcohol use: Yes    Alcohol/week: 6.0 oz    Types: 10 Cans of beer per week    Comment: occ  . Drug use: No  . Sexual activity: Not Currently  Lifestyle  . Physical activity:    Days per week: Not on file    Minutes per session: Not on file  . Stress: Not on file  Relationships  . Social connections:    Talks on phone: Not on file    Gets together: Not on file    Attends religious service: Not on file    Active member of club or organization: Not on file    Attends meetings of  clubs or organizations: Not on file    Relationship status: Not on file  Other Topics Concern  . Not on file  Social History Narrative   HSG, beauty school. Married '69 -18 yrs/widowed; married '79 - 80yr/divorced; married '87.  2 sons - '70, '74, 1 srtep-son; 1 grandchild. 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 SO - good health.  NO history of abuse.  ACP - full code and all heroic measures.     Vitals:   01/03/18 1444  BP: 118/68  Pulse: 96  Resp: 16  Temp: 97.6 F (36.4 C)  SpO2: 92%   Body mass index is 24.29 kg/m.   Physical Exam  Nursing note and vitals reviewed. Constitutional: She is oriented to person, place, and time. She appears well-developed. No distress.  HENT:  Head: Normocephalic and atraumatic.  Mouth/Throat: Oropharynx is clear and moist and mucous membranes are normal.  Eyes: Pupils are equal, round, and reactive to light. Conjunctivae are normal.  Neck: No JVD present.  Cardiovascular: Normal rate and regular rhythm.  No murmur heard. DP pulses present bilateral.  Respiratory: Effort normal and breath sounds normal. No respiratory distress. She exhibits no tenderness.  GI: Soft. She exhibits no mass. There is no hepatomegaly. There is no tenderness.  Musculoskeletal: She exhibits no edema.  Lymphadenopathy:    She has no cervical adenopathy.  Neurological: She is alert and oriented to person, place, and time. She has normal strength. Coordination normal.  Stable gait with no assistance.  Skin: Skin is warm. No rash noted. No erythema.  Psychiatric: She has a normal mood and affect.  Well groomed, good eye contact.     ASSESSMENT AND PLAN:   Ms. LEEASIA SECRIST was seen today for 3 months follow-up.  Orders Placed This Encounter  Procedures  . Hemoglobin A1c  . Urinalysis, Routine w reflex microscopic  . TSH  . Basic metabolic panel  . Ambulatory referral to Urology  . EKG  12-Lead   Lab Results  Component Value Date   TSH 3.47 01/03/2018   Lab  Results  Component Value Date   CREATININE 0.93 01/03/2018   BUN 10 01/03/2018   NA 137 01/03/2018   K 4.3 01/03/2018   CL 101 01/03/2018   CO2 25 01/03/2018   Lab Results  Component Value Date   HGBA1C 5.7 (H) 01/03/2018     Chest pain, unspecified type   We discussed possible etiologies:Cardiac, pulmonary, musculoskeletal, and GI. EKG today: SR, normal axis, no signs of acute ischemia, ? IVCD,early repolarization changes.  When compared with EKG in 10/2016 no major changes except for repolarization changes not present.  Given her history of CAD, I recommended arrange an appointment with her cardiologist. She was instructed about warning signs, she voices understanding.    GERD Still symptomatic. She is taking a high dose of Omeprazole, no changes today. Recommend improving GERD precautions, especially avoiding heavy meals at night and waiting about 3-4 hours before going to bed. Follow-up in 4-5 months.  Insomnia Improved. No changes in Trazodone 50 mg daily. Continue Xanax 2 mg 1/2 tab at bedtime. Good sleep hygiene also important. Follow-up in 4-5 months.   Anxiety disorder Also reporting improvement since Trazodone was started. She was able to decrease dose of Xanax from 2 mg to 1 mg.   No changes in current management. She understands side effects of Xanax. For now she will continue with Xanax 2 mg tablet, will consider changing to 1 mg next visit.   Hyperglycemia  Further recommendations will be given according to Hg A1c results. Healthy lifestyle for primary prevention recommended for now.   Gross hematuria  One episode.  we discussed possible causes. She agrees with urology evaluation. Instructed about warning signs.     -Ms. BRINLEE GAMBRELL was advised to return sooner than planned today if new concerns arise.        G. [place], MD  [facility]. [place] office.

## 2018-01-03 ENCOUNTER — Ambulatory Visit (INDEPENDENT_AMBULATORY_CARE_PROVIDER_SITE_OTHER): Payer: Medicare Other | Admitting: Family Medicine

## 2018-01-03 ENCOUNTER — Encounter: Payer: Self-pay | Admitting: Family Medicine

## 2018-01-03 VITALS — BP 118/68 | HR 96 | Temp 97.6°F | Resp 16 | Ht 66.0 in | Wt 150.5 lb

## 2018-01-03 DIAGNOSIS — R310 Gross hematuria: Secondary | ICD-10-CM

## 2018-01-03 DIAGNOSIS — R002 Palpitations: Secondary | ICD-10-CM | POA: Diagnosis not present

## 2018-01-03 DIAGNOSIS — R739 Hyperglycemia, unspecified: Secondary | ICD-10-CM

## 2018-01-03 DIAGNOSIS — F419 Anxiety disorder, unspecified: Secondary | ICD-10-CM | POA: Diagnosis not present

## 2018-01-03 DIAGNOSIS — G4700 Insomnia, unspecified: Secondary | ICD-10-CM | POA: Diagnosis not present

## 2018-01-03 DIAGNOSIS — R079 Chest pain, unspecified: Secondary | ICD-10-CM | POA: Diagnosis not present

## 2018-01-03 DIAGNOSIS — K219 Gastro-esophageal reflux disease without esophagitis: Secondary | ICD-10-CM

## 2018-01-03 NOTE — Assessment & Plan Note (Signed)
Improved. No changes in Trazodone 50 mg daily. Continue Xanax 2 mg 1/2 tab at bedtime. Good sleep hygiene also important. Follow-up in 4-5 months.

## 2018-01-03 NOTE — Assessment & Plan Note (Signed)
Still symptomatic. She is taking a high dose of Omeprazole, no changes today. Recommend improving GERD precautions, especially avoiding heavy meals at night and waiting about 3-4 hours before going to bed. Follow-up in 4-5 months.

## 2018-01-03 NOTE — Patient Instructions (Addendum)
A few things to remember from today's visit:   Insomnia, unspecified type  Anxiety disorder, unspecified type  Chest pain, unspecified type - Plan: EKG 12-Lead  Heart palpitations - Plan: Basic metabolic panel, TSH  Gross hematuria - Plan: Ambulatory referral to Urology, Urinalysis, Routine w reflex microscopic  Gastroesophageal reflux disease, esophagitis presence not specified  Hyperglycemia - Plan: Hemoglobin A1c  Please schedule appointment with your cardiologist in the next few days. Somebody will call you to let you know about appointment with urologist.  No changes on current medications.  Avoid eating late at night, weight 3-4 hours after eating.   Please be sure medication list is accurate. If a new problem present, please set up appointment sooner than planned today.

## 2018-01-03 NOTE — Assessment & Plan Note (Signed)
Also reporting improvement since Trazodone was started. She was able to decrease dose of Xanax from 2 mg to 1 mg.   No changes in current management. She understands side effects of Xanax. For now she will continue with Xanax 2 mg tablet, will consider changing to 1 mg next visit.

## 2018-01-04 LAB — URINALYSIS, ROUTINE W REFLEX MICROSCOPIC
Bacteria, UA: NONE SEEN /HPF
Bilirubin Urine: NEGATIVE
Glucose, UA: NEGATIVE
Hgb urine dipstick: NEGATIVE
Hyaline Cast: NONE SEEN /LPF
Ketones, ur: NEGATIVE
Nitrite: NEGATIVE
Protein, ur: NEGATIVE
RBC / HPF: NONE SEEN /HPF (ref 0–2)
Specific Gravity, Urine: 1.004 (ref 1.001–1.03)
pH: 6.5 (ref 5.0–8.0)

## 2018-01-04 LAB — BASIC METABOLIC PANEL
BUN: 10 mg/dL (ref 7–25)
CO2: 25 mmol/L (ref 20–32)
Calcium: 10.4 mg/dL (ref 8.6–10.4)
Chloride: 101 mmol/L (ref 98–110)
Creat: 0.93 mg/dL (ref 0.60–0.93)
Glucose, Bld: 85 mg/dL (ref 65–99)
Potassium: 4.3 mmol/L (ref 3.5–5.3)
Sodium: 137 mmol/L (ref 135–146)

## 2018-01-04 LAB — HEMOGLOBIN A1C
Hgb A1c MFr Bld: 5.7 % of total Hgb — ABNORMAL HIGH (ref <5.7)
Mean Plasma Glucose: 117 (calc)
eAG (mmol/L): 6.5 (calc)

## 2018-01-04 LAB — TSH: TSH: 3.47 mIU/L (ref 0.40–4.50)

## 2018-01-05 ENCOUNTER — Other Ambulatory Visit: Payer: Self-pay | Admitting: Internal Medicine

## 2018-01-06 NOTE — Telephone Encounter (Signed)
Routing to PCP - please advise

## 2018-01-20 ENCOUNTER — Ambulatory Visit: Payer: Medicare Other

## 2018-01-20 DIAGNOSIS — J449 Chronic obstructive pulmonary disease, unspecified: Secondary | ICD-10-CM | POA: Diagnosis not present

## 2018-01-27 ENCOUNTER — Ambulatory Visit
Admission: RE | Admit: 2018-01-27 | Discharge: 2018-01-27 | Disposition: A | Discharge status: Home / Self Care | Payer: Medicare Other | Source: Ambulatory Visit | Attending: Adult Health | Admitting: Adult Health

## 2018-01-27 DIAGNOSIS — Z1239 Encounter for other screening for malignant neoplasm of breast: Secondary | ICD-10-CM

## 2018-01-29 DIAGNOSIS — R310 Gross hematuria: Secondary | ICD-10-CM | POA: Diagnosis not present

## 2018-01-30 ENCOUNTER — Other Ambulatory Visit: Payer: Self-pay | Admitting: Family Medicine

## 2018-02-05 DIAGNOSIS — R310 Gross hematuria: Secondary | ICD-10-CM | POA: Diagnosis not present

## 2018-02-19 DIAGNOSIS — J449 Chronic obstructive pulmonary disease, unspecified: Secondary | ICD-10-CM | POA: Diagnosis not present

## 2018-02-25 ENCOUNTER — Ambulatory Visit: Payer: Medicare Other | Admitting: Nurse Practitioner

## 2018-02-25 NOTE — Progress Notes (Deleted)
CARDIOLOGY OFFICE NOTE  Date:  02/25/2018     [name] Date of Birth: 1946/07/06 Medical Record #[id number]  PCP:  Martinique, [name], MD  Cardiologist:  Berton Bon chief complaint on file.   History of Present Illness: [name] is a 72 y.o. female who presents today for a 6 month check. 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 She has elected to see me going forward.   She has a history of smoking, HTN, COPD, right breast cancer, hyperlipidemia, PAD, and mesenteric vascular disease. Patient had been having nocturnal and post-prandial abdominal cramping. She had a mesenteric angiogram done in 5/12 by Dr. Bridgett Larsson, showing occluded celiac and SMA arteries with patent IMA. She was planned for aorto-mesenteric bypass. She was referred for cardiology evaluation prior to surgery. However, it was ultimately decided not to treat her mesenteric disease surgically.   Given her exertional chest tightness and shortness of breath as well as known vascular disease, Dr. Aundra Dubin set her up for left heart cath. This was done in 5/12 and showed a 70-80% ostial stenosis of a small to moderate 2nd diagonal. This was too small to intervene on and not likely to be particularly symptomatic. Lower extremity arterial dopplers showed a severe focal proximal right SFA stenosis with collaterals from the PFA. She had a VATS for a right-sided lung nodule that showed granulomatous inflammation (no cancer). She quit smoking. She saw Dr. Fletcher Anon for PAD evaluation, and it was decided to treat her medically. She tried cilostazol but did not see much difference with its use so stopped it.   I saw her back November of 2017 - she was doing well. Was back on Pletal. Not smoking.   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 Had  also been to the ER - had more swelling in the right leg - CT done. Duplex on the right leg was then negative. Had been told to see vascular surgery - I then referred her to Dr. Donzetta Matters for this as well as a nonhealing ulcer - discussed possible aortogram - but she did not follow back up there.  At the time of my visit in June - she had significant rash - saw Rheumatology - not felt to have lupus. Her cardiac status fortunately, felt to be ok. I did want to get her echo updated at last visit - lhowever this was never completed however.    Last seen by me back in November - she was doing well. Needed new PCP. Was out of her thyroid medicine - could not get refilled. Getting CT of the chest to follow her lung nodule. Cardiac status seemed ok.      PMH: 1. Hypothyroidism 2. HTN 3. GERD 4. Hypothyroidism 5. OSA 6. Venous insufficiency 7. 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 The IMA was patent. CTA abdomen (6/14) showed occluded celiac and SMA with distal reconstitution, no changes to suggest bowel infarction or ischemia.  8. Hyperlipidemia 9. COPD: PFTs (6/12) with moderate obstructive airways disease and response to bronchodilator.  10. Hyperlipidemia 11. Fibromyalgia 12. Breast cancer s/p mastectomy and chemotherapy in 2010.  13. CAD: LHC (5/12) with 70-80% ostial stenosis of a small-moderate 2nd diagonal. This was too small  to intervene upon and unlikely to cause significant symptoms.  14. PAD: Peripheral arterial dopplers (5/12) with severe focal proximal right SFA stenosis. There were collaterals from the PFA. Patient has claudication symptoms. Peripheral arterial dopplers in 5/13 were stable compared to 5/12. Peripheral arterial dopplers (8/14) with ABIs 0.71 on right, 1.1 on left (known right SFA occlusion).  15. 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  VATS 8/12 for right upper lobe nodule showed granulomatous material.    Past Medical History:  Diagnosis Date  . Cancer ([place]) 2010   invasive ductal breast cancer; on tamoxifen  . Complication of anesthesia    was in ICU after lung surgery 2012  . COPD (chronic obstructive pulmonary disease) ([place])   . Diverticulitis    required colcectomy  . Emphysema of lung ([place])   . Fibromyalgia 1995  . GERD (gastroesophageal reflux disease)   . Hearing loss of both ears    uses hearing aids  bilaterally  . Hx of colonic polyps    Dr. Cristina Gong -last study '11  . Hyperlipidemia   . Hypertension   . Hypothyroidism   . Osteoporosis   . Peripheral vascular disease [facility]) May '12 - CT angio   SMA chronic occlusion; stenosis of celiac trunk.  . Shortness of breath dyspnea   . Varicose veins of leg with swelling    varicose vein surgery - Dr. Aleda Grana  . Wears glasses     Past Surgical History:  Procedure Laterality Date  . ABDOMINAL HYSTERECTOMY  1973   metorrhagia  . APPENDECTOMY    . BREAST SURGERY  2010   mastectomy with reconstructive surgery  . BREAST SURGERY  2010   rt lump x2  . BREAST SURGERY  2010   partial rt br reduction  . CARDIAC CATHETERIZATION  '11   no obstructive coronary disease  . COLECTOMY  2002   sigmoid  . COLONOSCOPY W/ POLYPECTOMY    . LUNG SURGERY  9/12   rt mid lung wedge  . PARTIAL MASTECTOMY WITH NEEDLE LOCALIZATION Left 02/23/2013   Procedure:  LEFT PARTIAL MASTECTOMY WITH NEEDLE LOCALIZATION;  Surgeon: Adin Hector, MD;  Location: [place];  Service: General;  Laterality: Left;  . [place]   right upper lobe of lung as part of VAT-nodule/benign     Medications: No outpatient medications have been marked as taking for the 02/25/18 encounter (Appointment) with [name], NP.     Allergies: Allergies  Allergen Reactions  . Sulfonamide Derivatives Swelling    Swelling in face    Social History: The  patient  reports that she quit smoking about 23 months ago. Her smoking use included cigarettes. She has a 37.50 pack-year smoking history. She has never used smokeless tobacco. She reports that she drinks about 6.0 oz of alcohol per week. She reports that she does not use drugs.   Family History: The patient's family history includes COPD in her mother; Cancer in her sister and sister; Colon cancer in her mother; Coronary artery disease in her brother and father; Diabetes in her mother; Emphysema in her mother; Heart attack in her brother and father; Heart disease in her father; Heart failure in her sister; Hypertension in her mother, sister, and sister; Melanoma in her sister; Pancreatic cancer in her sister; Sudden death in her father; Throat cancer in her sister.   Review of Systems: Please see the history of present illness.   Otherwise,  the review of systems is positive for none.   All other systems are reviewed and negative.   Physical Exam: VS:  There were no vitals taken for this visit. Marland Kitchen  BMI There is no height or weight on file to calculate BMI.  Wt Readings from Last 3 Encounters:  01/03/18 150 lb 8 oz (68.3 kg)  12/20/17 151 lb 3.2 oz (68.6 kg)  11/21/17 155 lb 12.8 oz (70.7 kg)    General: Pleasant. Well developed, well nourished and in no acute distress.   HEENT: Normal.  Neck: Supple, no JVD, carotid bruits, or masses noted.  Cardiac: Regular rate and rhythm. No murmurs, rubs, or gallops. No edema.  Respiratory:  Lungs are clear to auscultation bilaterally with normal work of breathing.  GI: Soft and nontender.  MS: No deformity or atrophy. Gait and ROM intact.  Skin: Warm and dry. Color is normal.  Neuro:  Strength and sensation are intact and no gross focal deficits noted.  Psych: Alert, appropriate and with normal affect.   LABORATORY DATA:  EKG:  EKG is not ordered today.  Lab Results  Component Value Date   WBC 5.3 11/14/2017   HGB 14.6 11/14/2017   HCT 44.5  11/14/2017   PLT 345 11/14/2017   GLUCOSE 85 01/03/2018   CHOL 164 08/27/2017   TRIG 86 08/27/2017   HDL 74 08/27/2017   LDLDIRECT 88.0 08/18/2013   LDLCALC 73 08/27/2017   ALT 17 11/14/2017   AST 18 11/14/2017   NA 137 01/03/2018   K 4.3 01/03/2018   CL 101 01/03/2018   CREATININE 0.93 01/03/2018   BUN 10 01/03/2018   CO2 25 01/03/2018   TSH 3.47 01/03/2018   INR 0.98 03/23/2013   HGBA1C 5.7 (H) 01/03/2018     BNP (last 3 results) No results for input(s): BNP in the last 8760 hours.  ProBNP (last 3 results) No results for input(s): PROBNP in the last 8760 hours.   Other Studies Reviewed Today:  CT CHEST IMPRESSION 08/2017: 1. Previously described pulmonary nodules are similar and resolved as detailed above. 2. However, there are new pulmonary nodules, including within the right lower lobe. Given morphology, these may be infectious or inflammatory. Consider antibiotic therapy and follow-up chest CT at 3-6 months. 3.  Emphysema (ICD10-J43.9). 4. Coronary artery atherosclerosis. Aortic Atherosclerosis (ICD10-I70.0). 5. Hiatal hernia. 6. Probable cholelithiasis.  Electronically Signed   By: Abigail Miyamoto M.D.   On: 08/29/2017 15:40   CT ANGIO BIFEMIMPRESSION6/2018: VASCULAR  Moderate atherosclerotic disease throughout the aorta and common iliac vessels.  Near complete occlusion of the celiac artery and complete occlusion of the proximal SMA. Reconstitution of the SMA through IMA.  Moderate disease throughout the right superficial femoral artery and trifurcation vessels in the right calf. Mild disease within the left posterior tibial artery and proximal anterior tibial artery, otherwise patent left lower extremity runoff.  No visible deep venous thrombosis.  NON-VASCULAR  Moderate-sized hiatal hernia.  No acute findings in the abdomen or pelvis.  Subcutaneous edema/swelling in the right lower extremity from the mid thigh through the  calf.   Electronically Signed By: Rolm Baptise M.D. On: 03/16/2017 18:22    Summary:  - No evidence of deep vein thrombosis involving the right lower extremity. - No evidence of Baker&'s cyst on the right.  Other specific details can be found in the table(s) above. Prepared and Electronically Authenticated by  Jessy Oto. Fields MD 2018-06-03T12:09:54   02/27/2017: Vascular US Lower Extremities Evidence of right  lower extremity chronic, non occlusive, deep venous thrombus in one of the paired peroneal veins with incompetency.  No evidence of left lower extremity deep or superficial venous thrombus or incompetence.  Echo Study Conclusions 06/2016  - Left ventricle: The cavity size was normal. Systolic function was normal. The estimated ejection fraction was in the range of 55% to 60%. Doppler parameters are consistent with abnormal left ventricular relaxation (grade 1 diastolic dysfunction). - Mitral valve: Calcified annulus. Mildly thickened leaflets .   Assessment/Plan:  1.Right leg swelling -had prior peroneal DVT noted in May - now resolved.   2. Ulcer - had been referred to [facility] - resolved.   3.PAD: was seen earlier this year at [facility] - no current issues noted.   4. Mesenteric ischemia: She has been managed medicallyover the past several years.She notes some generalized bloating. She is worried that she may have a hernia - I do not really appreciate this on exam today - have asked to try and discuss with PCP once she arranges.   5. CAD: Nonobstructive CAD. No active chest pain.   6. COPD: followed by pulmonary.She has stopped smoking.She is on oxygen at night.For repeat CT later this month.   7. HTN: BPis ok on her current regimen.   8. General medical care - needs new PCP - list given today.   9. Hypothyroid - I will refill but will not be able to refill long term. She is aware. She needs labs today.   10. HLD -  needs labs today.    Current medicines are reviewed with the patient today.  The patient does not have concerns regarding medicines other than what has been noted above.  The following changes have been made:  See above.  Labs/ tests ordered today include:   No orders of the defined types were placed in this encounter.    Disposition:   FU with me in 6 months.   Patient is agreeable to this plan and will call if any problems develop in the interim.   SignedTruitt Merle, NP  02/25/2018 7:32 AM  [facility] [address] [place], [place]  [address] Phone: [phone] Fax: [phone]

## 2018-02-28 ENCOUNTER — Telehealth: Payer: Self-pay | Admitting: *Deleted

## 2018-02-28 NOTE — Telephone Encounter (Signed)
S/w pt to move appt time on June 10 to 12:00 pm.  Could not move appt due to schedule being locked but pt is aware appt is at 12:00 pm on June 10.  New appt time in appt notes. Pt apologized profusely for missing last appt.

## 2018-03-03 DIAGNOSIS — R310 Gross hematuria: Secondary | ICD-10-CM | POA: Diagnosis not present

## 2018-03-03 DIAGNOSIS — D414 Neoplasm of uncertain behavior of bladder: Secondary | ICD-10-CM | POA: Diagnosis not present

## 2018-03-05 ENCOUNTER — Other Ambulatory Visit: Payer: Self-pay | Admitting: Urology

## 2018-03-06 ENCOUNTER — Encounter (HOSPITAL_BASED_OUTPATIENT_CLINIC_OR_DEPARTMENT_OTHER): Payer: Self-pay | Admitting: *Deleted

## 2018-03-06 ENCOUNTER — Other Ambulatory Visit: Payer: Self-pay

## 2018-03-06 NOTE — Progress Notes (Addendum)
Spoke w/ pt via phone for pre-op interview.  Npo after mn.  Arrive at 0830.  Needs istat.  Current ekg and cxr in chart and epic.  Will do trelegy inhaler and take prilosec am dos w/ sips of water.  Due to COPD w/ hx frequent exacerbation, advised pt to do nebulizer Sunday and Monday nights.  Will review chart w/ anesthesia.  ADDENDUM:  Reviewed pt chart w/ dr germeroth mda, face to face.  After reviewing chart , dr germeroth stated pt should be done at main OR due to severe COPD. Called and lvm for coni, or scheduler for dr eskrigde, to inform her pt not candidate to [facility] and needs to be done at main OR.

## 2018-03-07 NOTE — Progress Notes (Signed)
Spoke with patient on husbands cell phone. Pt is out of town until Monday. I went over all of the preop instructions with her. She agrees to everything we discussed and does not have any questions for me.

## 2018-03-07 NOTE — Progress Notes (Signed)
Left 2 voice mail messages for patient to call us back to give her preop instructions for main OR at [facility].  Patient did not return call to [phone]  At last message left voice message for patient to call [phone].  Chart handed off to Short Stay with instructions and note on front of chart.

## 2018-03-10 NOTE — H&P (Signed)
Office Visit Report     03/03/2018   --------------------------------------------------------------------------------   [name]. [place]  MRN: [id number]  PRIMARY CARE:  Betty Martinique, MD  DOB: 10-Aug-1946, 72 year old Female  REFERRING:  Betty Martinique, MD  SSN:   PROVIDER:  Baruch Gouty, M.D.    TREATING:  Festus Aloe, M.D.    LOCATION:  [facility] [phone]   --------------------------------------------------------------------------------   CC: I have blood in my urine.  HPI: [name] is a 71 year-old female established patient who is here for blood in the urine.  She did see the blood in her urine. She first noticed the symptoms approximately 11/15/2017. She has not seen blood clots.   She does not have a burning sensation when she urinates. She is not currently having trouble urinating.   She is not having pain.   Her last U/S or CT Scan was 03/16/2017.   She had a gross hematuria episode with red urine 2 months ago and three weeks ago. No stones. No dysuria or gross hematuria. S/p TVhx. She had chemo for breast ca in 2010. No chemical exposure. She smoked for 50 yrs and quit 2 years ago.   She returns for cystoscopy. 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     ALLERGIES: sulfonamide - Swelling    MEDICATIONS: Levothyroxine Sodium 112 mcg tablet  Metoprolol Tartrate 25 mg tablet  Omeprazole 40 mg capsule,delayed release  Advair Diskus 100 mcg-50 mcg/dose blister, with inhalation device  Amitriptyline Hcl 50 mg tablet  Aspirin Ec 81 mg tablet, delayed release  Atorvastatin Calcium 80 mg tablet  Biotin  Cilostazol 100 mg tablet  Fluticasone Propionate 50 mcg/actuation spray, suspension  Iron 325 mg (65 mg iron) tablet  Lorazepam 2 mg tablet  Mucinex Dm 600 mg-30 mg tablet, extended release 12 hr  Proair Hfa 90 mcg hfa aerosol with adapter  Stool Softener 100 mg  capsule  Trazodone Hcl 50 mg tablet     GU PSH: Locm 300-399Mg /Ml Iodine,1Ml - 02/05/2018    NON-GU PSH: No Non-GU PSH    GU PMH: Gross hematuria - 01/29/2018    NON-GU PMH: Anxiety Arthritis Breast Cancer, History GERD Hypercholesterolemia Hypertension Pulmonary Embolism, History    FAMILY HISTORY: Cancer - Runs in Family cardiac disorder - Runs in Family Death of family member - Mother, Father    Notes: 2 sons   SOCIAL HISTORY: Marital Status: Married Preferred Language: English; Ethnicity: Not Hispanic Or Latino; Race: White Current Smoking Status: Patient does not smoke anymore. Has not smoked since 01/14/2016. Smoked for 50 years.   Tobacco Use Assessment Completed: Used Tobacco in last 30 days? Does not use smokeless tobacco. Does drink.  Drinks 4+ caffeinated drinks per day.    REVIEW OF SYSTEMS:    GU Review Female:   Patient denies frequent urination, hard to postpone urination, burning /pain with urination, get up at night to urinate, leakage of urine, stream starts and stops, trouble starting your stream, have to strain to urinate, and being pregnant.  Gastrointestinal (Upper):   Patient reports nausea and vomiting. Patient denies indigestion/ heartburn.  Gastrointestinal (Lower):   Patient denies diarrhea and constipation.  Constitutional:   Patient denies fever, night sweats, weight loss, and fatigue.  Skin:   Patient denies skin rash/ lesion and itching.  Eyes:   Patient denies blurred vision and double vision.  Ears/ Nose/  Throat:   Patient denies sore throat and sinus problems.  Hematologic/Lymphatic:   Patient denies swollen glands and easy bruising.  Cardiovascular:   Patient denies leg swelling and chest pains.  Respiratory:   Patient denies cough and shortness of breath.  Endocrine:   Patient denies excessive thirst.  Musculoskeletal:   Patient denies back pain and joint pain.  Neurological:   Patient denies headaches and dizziness.  Psychologic:    Patient denies depression and anxiety.   VITAL SIGNS:      03/03/2018 02:40 PM  Weight 145.6 lb / 66.04 kg  Height 66 in / 167.64 cm  BP 125/75 mmHg  Pulse 58 /min  Temperature 97.3 F / 36.2 C  BMI 23.5 kg/m   GU PHYSICAL EXAMINATION:    External Genitalia: No hirsutism, no rash, no scarring, no cyst, no erythematous lesion, no papular lesion, no blanched lesion, no warty lesion. No edema.  Urethral Meatus: Normal size. Normal position. No discharge.  Urethra: No tenderness, no mass, no scarring. No hypermobility. No leakage.  Bladder: Normal to palpation, no tenderness, no mass, normal size.  Vagina: No atrophy, no stenosis. No rectocele. No cystocele. No enterocele.  Cervix: No inflammation, no discharge, no lesion, no tenderness, no wart.   MULTI-SYSTEM PHYSICAL EXAMINATION:    Constitutional: Well-nourished. No physical deformities. Normally developed. Good grooming.  Neck: Neck symmetrical, not swollen. Normal tracheal position.  Respiratory: No labored breathing, no use of accessory muscles. Normal breath sounds.  Cardiovascular: Regular rate and rhythm. Normal temperature, normal extremity pulses, no swelling, no varicosities.   Neurologic / Psychiatric: Oriented to time, oriented to place, oriented to person. No depression, no anxiety, no agitation.  Gastrointestinal: No mass, no tenderness, no rigidity, non obese abdomen.     PAST DATA REVIEWED:  Source Of History:  Patient   PROCEDURES:         Flexible Cystoscopy - 52000  Risks, benefits, and some of the potential complications of the procedure were discussed at length with the patient including infection, bleeding, voiding discomfort, urinary retention, fever, chills, sepsis, and others. All questions were answered. Informed consent was obtained. Antibiotic prophylaxis was given. Sterile technique and intraurethral analgesia were used. Chaperone - Lauren - for exam and cystoscopy.   Meatus:  Normal size. Normal  location. Normal condition.  Urethra:  No hypermobility. No leakage.  Ureteral Orifices:  Normal location. Normal size. Normal shape. Effluxed clear urine.  Bladder:  A right lateral wall tumor. No trabeculation. Normal mucosa. No stones.      The lower urinary tract was carefully examined. The procedure was well-tolerated and without complications. Antibiotic instructions were given. Instructions were given to call the office immediately for bloody urine, difficulty urinating, urinary retention, painful or frequent urination, fever, chills, nausea, vomiting or other illness. The patient stated that she understood these instructions and would comply with them.   ASSESSMENT:      ICD-10 Details  1 GU:   Gross hematuria - R31.0   2   Bladder tumor/neoplasm - D41.4    PLAN:           Document Letter(s):  Created for Patient: Clinical Summary         Notes:   bladder neoplasm - I discussed with the patient and her husband the nature of risks benefits and alternatives to TURBT and postoperative epirubicin instillation. All questions answered. They elected to proceed.   Cc: Dr. Martinique    * Signed by Festus Aloe, M.D. on 03/03/18  at 4:31 PM (EDT)*     The information contained in this medical record document is considered private and confidential patient information. This information can only be used for the medical diagnosis and/or medical services that are being provided by the patient's selected caregivers. This information can only be distributed outside of the patient's care if the patient agrees and signs waivers of authorization for this information to be sent to an outside source or route.

## 2018-03-11 ENCOUNTER — Encounter (HOSPITAL_COMMUNITY): Payer: Self-pay | Admitting: Anesthesiology

## 2018-03-11 ENCOUNTER — Encounter (HOSPITAL_COMMUNITY)
Admission: RE | Disposition: A | Discharge status: Home / Self Care | Payer: Self-pay | Source: Ambulatory Visit | Attending: Urology

## 2018-03-11 ENCOUNTER — Ambulatory Visit (HOSPITAL_COMMUNITY): Payer: Medicare Other | Admitting: Certified Registered"

## 2018-03-11 ENCOUNTER — Other Ambulatory Visit: Payer: Self-pay

## 2018-03-11 ENCOUNTER — Ambulatory Visit (HOSPITAL_COMMUNITY)
Admission: RE | Admit: 2018-03-11 | Discharge: 2018-03-11 | Disposition: A | Discharge status: Home / Self Care | Payer: Medicare Other | Source: Ambulatory Visit | Attending: Urology | Admitting: Urology

## 2018-03-11 DIAGNOSIS — N3289 Other specified disorders of bladder: Secondary | ICD-10-CM | POA: Diagnosis not present

## 2018-03-11 DIAGNOSIS — C672 Malignant neoplasm of lateral wall of bladder: Secondary | ICD-10-CM | POA: Diagnosis not present

## 2018-03-11 DIAGNOSIS — Z853 Personal history of malignant neoplasm of breast: Secondary | ICD-10-CM | POA: Diagnosis not present

## 2018-03-11 DIAGNOSIS — E785 Hyperlipidemia, unspecified: Secondary | ICD-10-CM | POA: Diagnosis not present

## 2018-03-11 DIAGNOSIS — F419 Anxiety disorder, unspecified: Secondary | ICD-10-CM | POA: Diagnosis not present

## 2018-03-11 DIAGNOSIS — Z79899 Other long term (current) drug therapy: Secondary | ICD-10-CM | POA: Insufficient documentation

## 2018-03-11 DIAGNOSIS — M1990 Unspecified osteoarthritis, unspecified site: Secondary | ICD-10-CM | POA: Insufficient documentation

## 2018-03-11 DIAGNOSIS — Z9981 Dependence on supplemental oxygen: Secondary | ICD-10-CM | POA: Diagnosis not present

## 2018-03-11 DIAGNOSIS — Z7951 Long term (current) use of inhaled steroids: Secondary | ICD-10-CM | POA: Insufficient documentation

## 2018-03-11 DIAGNOSIS — Z7982 Long term (current) use of aspirin: Secondary | ICD-10-CM | POA: Insufficient documentation

## 2018-03-11 DIAGNOSIS — D494 Neoplasm of unspecified behavior of bladder: Secondary | ICD-10-CM

## 2018-03-11 DIAGNOSIS — R310 Gross hematuria: Secondary | ICD-10-CM | POA: Diagnosis not present

## 2018-03-11 DIAGNOSIS — Z86711 Personal history of pulmonary embolism: Secondary | ICD-10-CM | POA: Insufficient documentation

## 2018-03-11 DIAGNOSIS — J449 Chronic obstructive pulmonary disease, unspecified: Secondary | ICD-10-CM | POA: Insufficient documentation

## 2018-03-11 DIAGNOSIS — Z79890 Hormone replacement therapy (postmenopausal): Secondary | ICD-10-CM | POA: Insufficient documentation

## 2018-03-11 DIAGNOSIS — K449 Diaphragmatic hernia without obstruction or gangrene: Secondary | ICD-10-CM | POA: Diagnosis not present

## 2018-03-11 DIAGNOSIS — M797 Fibromyalgia: Secondary | ICD-10-CM | POA: Diagnosis not present

## 2018-03-11 DIAGNOSIS — Z87891 Personal history of nicotine dependence: Secondary | ICD-10-CM | POA: Insufficient documentation

## 2018-03-11 DIAGNOSIS — G709 Myoneural disorder, unspecified: Secondary | ICD-10-CM | POA: Insufficient documentation

## 2018-03-11 DIAGNOSIS — K219 Gastro-esophageal reflux disease without esophagitis: Secondary | ICD-10-CM | POA: Insufficient documentation

## 2018-03-11 DIAGNOSIS — Z9221 Personal history of antineoplastic chemotherapy: Secondary | ICD-10-CM | POA: Diagnosis not present

## 2018-03-11 DIAGNOSIS — I739 Peripheral vascular disease, unspecified: Secondary | ICD-10-CM | POA: Insufficient documentation

## 2018-03-11 DIAGNOSIS — E039 Hypothyroidism, unspecified: Secondary | ICD-10-CM | POA: Insufficient documentation

## 2018-03-11 DIAGNOSIS — I10 Essential (primary) hypertension: Secondary | ICD-10-CM | POA: Insufficient documentation

## 2018-03-11 DIAGNOSIS — I2510 Atherosclerotic heart disease of native coronary artery without angina pectoris: Secondary | ICD-10-CM | POA: Diagnosis not present

## 2018-03-11 DIAGNOSIS — C679 Malignant neoplasm of bladder, unspecified: Secondary | ICD-10-CM | POA: Diagnosis not present

## 2018-03-11 HISTORY — DX: Disorder of arteries and arterioles, unspecified: I77.9

## 2018-03-11 HISTORY — DX: Chronic embolism and thrombosis of unspecified deep veins of right lower extremity: I82.501

## 2018-03-11 HISTORY — DX: Chronic obstructive pulmonary disease with (acute) exacerbation: J44.1

## 2018-03-11 HISTORY — PX: CYSTOSCOPY: SHX5120

## 2018-03-11 HISTORY — DX: Diaphragmatic hernia without obstruction or gangrene: K44.9

## 2018-03-11 HISTORY — DX: Dependence on supplemental oxygen: Z99.81

## 2018-03-11 HISTORY — DX: Chronic obstructive pulmonary disease, unspecified: J44.9

## 2018-03-11 HISTORY — DX: Unspecified osteoarthritis, unspecified site: M19.90

## 2018-03-11 HISTORY — DX: Emphysema, unspecified: J43.9

## 2018-03-11 HISTORY — DX: Cramp and spasm: R25.2

## 2018-03-11 HISTORY — DX: Personal history of malignant neoplasm of breast: Z85.3

## 2018-03-11 HISTORY — DX: Personal history of other diseases of the digestive system: Z87.19

## 2018-03-11 HISTORY — DX: Neoplasm of unspecified behavior of bladder: D49.4

## 2018-03-11 HISTORY — DX: Nocturia: R35.1

## 2018-03-11 HISTORY — DX: Atherosclerotic heart disease of native coronary artery without angina pectoris: I25.10

## 2018-03-11 HISTORY — DX: Dyspnea, unspecified: R06.00

## 2018-03-11 HISTORY — PX: TRANSURETHRAL RESECTION OF BLADDER TUMOR: SHX2575

## 2018-03-11 HISTORY — DX: Chronic vascular disorders of intestine: K55.1

## 2018-03-11 HISTORY — DX: Other forms of dyspnea: R06.09

## 2018-03-11 HISTORY — DX: Personal history of other specified conditions: Z87.898

## 2018-03-11 HISTORY — DX: Presence of external hearing-aid: Z97.4

## 2018-03-11 HISTORY — DX: Solitary pulmonary nodule: R91.1

## 2018-03-11 LAB — BASIC METABOLIC PANEL
Anion gap: 12 (ref 5–15)
BUN: 12 mg/dL (ref 6–20)
CO2: 21 mmol/L — ABNORMAL LOW (ref 22–32)
Calcium: 9.1 mg/dL (ref 8.9–10.3)
Chloride: 104 mmol/L (ref 101–111)
Creatinine, Ser: 0.79 mg/dL (ref 0.44–1.00)
GFR calc Af Amer: >60 mL/min (ref >60)
GFR calc non Af Amer: >60 mL/min (ref >60)
Glucose, Bld: 110 mg/dL — ABNORMAL HIGH (ref 65–99)
Potassium: 4.3 mmol/L (ref 3.5–5.1)
Sodium: 137 mmol/L (ref 135–145)

## 2018-03-11 LAB — CBC
HCT: 47.1 % — ABNORMAL HIGH (ref 36.0–46.0)
Hemoglobin: 16.3 g/dL — ABNORMAL HIGH (ref 12.0–15.0)
MCH: 31.2 pg (ref 26.0–34.0)
MCHC: 34.6 g/dL (ref 30.0–36.0)
MCV: 90.1 fL (ref 78.0–100.0)
Platelets: 347 10*3/uL (ref 150–400)
RBC: 5.23 MIL/uL — ABNORMAL HIGH (ref 3.87–5.11)
RDW: 14.6 % (ref 11.5–15.5)
WBC: 6.8 10*3/uL (ref 4.0–10.5)

## 2018-03-11 SURGERY — TURBT (TRANSURETHRAL RESECTION OF BLADDER TUMOR)
Anesthesia: General

## 2018-03-11 MED ORDER — PROPOFOL 10 MG/ML IV BOLUS
INTRAVENOUS | Status: AC
  Filled 2018-03-11: qty 40

## 2018-03-11 MED ORDER — LIDOCAINE 2% (20 MG/ML) 5 ML SYRINGE
INTRAMUSCULAR | Status: AC
  Filled 2018-03-11: qty 5

## 2018-03-11 MED ORDER — DEXAMETHASONE SODIUM PHOSPHATE 10 MG/ML IJ SOLN
INTRAMUSCULAR | Status: DC | PRN
  Administered 2018-03-11: 10 mg via INTRAVENOUS

## 2018-03-11 MED ORDER — MIDAZOLAM HCL 2 MG/2ML IJ SOLN
INTRAMUSCULAR | Status: AC
  Filled 2018-03-11: qty 2

## 2018-03-11 MED ORDER — ONDANSETRON HCL 4 MG/2ML IJ SOLN
INTRAMUSCULAR | Status: DC | PRN
  Administered 2018-03-11: 4 mg via INTRAVENOUS

## 2018-03-11 MED ORDER — CEFAZOLIN SODIUM-DEXTROSE 2-4 GM/100ML-% IV SOLN
2.0000 g | Freq: Once | INTRAVENOUS | Status: AC
  Administered 2018-03-11: 2 g via INTRAVENOUS
  Filled 2018-03-11: qty 100

## 2018-03-11 MED ORDER — FENTANYL CITRATE (PF) 250 MCG/5ML IJ SOLN
INTRAMUSCULAR | Status: DC | PRN
  Administered 2018-03-11: 40 ug via INTRAVENOUS
  Administered 2018-03-11: 60 ug via INTRAVENOUS

## 2018-03-11 MED ORDER — MIDAZOLAM HCL 2 MG/2ML IJ SOLN
INTRAMUSCULAR | Status: DC | PRN
  Administered 2018-03-11: 2 mg via INTRAVENOUS

## 2018-03-11 MED ORDER — IPRATROPIUM-ALBUTEROL 0.5-2.5 (3) MG/3ML IN SOLN
3.0000 mL | Freq: Once | RESPIRATORY_TRACT | Status: AC
  Administered 2018-03-11: 3 mL via RESPIRATORY_TRACT
  Filled 2018-03-11: qty 3

## 2018-03-11 MED ORDER — FENTANYL CITRATE (PF) 250 MCG/5ML IJ SOLN
INTRAMUSCULAR | Status: AC
  Filled 2018-03-11: qty 5

## 2018-03-11 MED ORDER — CEPHALEXIN 500 MG PO CAPS: 500.0000 mg | ORAL_CAPSULE | Freq: Every day | ORAL | 0 refills | Status: AC

## 2018-03-11 MED ORDER — LIDOCAINE 2% (20 MG/ML) 5 ML SYRINGE
INTRAMUSCULAR | Status: DC | PRN
  Administered 2018-03-11: 40 mg via INTRAVENOUS

## 2018-03-11 MED ORDER — 0.9 % SODIUM CHLORIDE (POUR BTL) OPTIME
TOPICAL | Status: DC | PRN
  Administered 2018-03-11: 1000 mL

## 2018-03-11 MED ORDER — PHENYLEPHRINE 40 MCG/ML (10ML) SYRINGE FOR IV PUSH (FOR BLOOD PRESSURE SUPPORT)
PREFILLED_SYRINGE | INTRAVENOUS | Status: AC
  Filled 2018-03-11: qty 10

## 2018-03-11 MED ORDER — SODIUM CHLORIDE 0.9 % IR SOLN
Status: DC | PRN
  Administered 2018-03-11: 6000 mL

## 2018-03-11 MED ORDER — FENTANYL CITRATE (PF) 100 MCG/2ML IJ SOLN: 25.0000 ug | INTRAMUSCULAR | Status: DC | PRN

## 2018-03-11 MED ORDER — PROMETHAZINE HCL 25 MG/ML IJ SOLN: 6.2500 mg | INTRAMUSCULAR | Status: DC | PRN

## 2018-03-11 MED ORDER — LACTATED RINGERS IV SOLN
INTRAVENOUS | Status: DC
  Administered 2018-03-11 (×2): via INTRAVENOUS

## 2018-03-11 MED ORDER — ROCURONIUM BROMIDE 10 MG/ML (PF) SYRINGE
PREFILLED_SYRINGE | INTRAVENOUS | Status: AC
  Filled 2018-03-11: qty 5

## 2018-03-11 MED ORDER — PROPOFOL 10 MG/ML IV BOLUS
INTRAVENOUS | Status: DC | PRN
  Administered 2018-03-11: 100 mg via INTRAVENOUS

## 2018-03-11 MED ORDER — LACTATED RINGERS IV SOLN: INTRAVENOUS | Status: DC

## 2018-03-11 MED ORDER — ONDANSETRON HCL 4 MG/2ML IJ SOLN
INTRAMUSCULAR | Status: AC
  Filled 2018-03-11: qty 2

## 2018-03-11 MED ORDER — TRAMADOL HCL 50 MG PO TABS: 50.0000 mg | ORAL_TABLET | Freq: Four times a day (QID) | ORAL | 0 refills | Status: DC | PRN

## 2018-03-11 MED ORDER — DEXAMETHASONE SODIUM PHOSPHATE 10 MG/ML IJ SOLN
INTRAMUSCULAR | Status: AC
  Filled 2018-03-11: qty 1

## 2018-03-11 MED ORDER — PHENYLEPHRINE 40 MCG/ML (10ML) SYRINGE FOR IV PUSH (FOR BLOOD PRESSURE SUPPORT)
PREFILLED_SYRINGE | INTRAVENOUS | Status: DC | PRN
  Administered 2018-03-11: 80 ug via INTRAVENOUS
  Administered 2018-03-11: 160 ug via INTRAVENOUS
  Administered 2018-03-11: 80 ug via INTRAVENOUS
  Administered 2018-03-11: 160 ug via INTRAVENOUS
  Administered 2018-03-11: 80 ug via INTRAVENOUS

## 2018-03-11 MED ORDER — MEPERIDINE HCL 50 MG/ML IJ SOLN: 6.2500 mg | INTRAMUSCULAR | Status: DC | PRN

## 2018-03-11 MED ORDER — SODIUM CHLORIDE 0.9 % IJ SOLN
50.0000 mg | Freq: Once | INTRAVENOUS | Status: AC
  Administered 2018-03-11: 50 mg via INTRAVESICAL
  Filled 2018-03-11: qty 50

## 2018-03-11 SURGICAL SUPPLY — 24 items
BAG URINE DRAINAGE (UROLOGICAL SUPPLIES) ×1 IMPLANT
BAG URO CATCHER STRL LF (MISCELLANEOUS) ×2 IMPLANT
BASKET ZERO TIP NITINOL 2.4FR (BASKET) IMPLANT
BSKT STON RTRVL ZERO TP 2.4FR (BASKET)
CATH FOLEY 2WAY SLVR  5CC 16FR (CATHETERS) ×1
CATH FOLEY 2WAY SLVR 5CC 16FR (CATHETERS) IMPLANT
CATH INTERMIT  6FR 70CM (CATHETERS) IMPLANT
CLOTH BEACON ORANGE TIMEOUT ST (SAFETY) ×2 IMPLANT
COVER FOOTSWITCH UNIV (MISCELLANEOUS) ×1 IMPLANT
COVER SURGICAL LIGHT HANDLE (MISCELLANEOUS) ×2 IMPLANT
GLOVE BIOGEL M STRL SZ7.5 (GLOVE) ×2 IMPLANT
GLOVE BIOGEL PI IND STRL 6.5 (GLOVE) IMPLANT
GLOVE BIOGEL PI IND STRL 7.0 (GLOVE) IMPLANT
GLOVE BIOGEL PI INDICATOR 6.5 (GLOVE) ×1
GLOVE BIOGEL PI INDICATOR 7.0 (GLOVE) ×1
GOWN STRL REUS W/TWL LRG LVL3 (GOWN DISPOSABLE) ×2 IMPLANT
GOWN STRL REUS W/TWL XL LVL3 (GOWN DISPOSABLE) ×2 IMPLANT
GUIDEWIRE ANG ZIPWIRE 038X150 (WIRE) IMPLANT
GUIDEWIRE STR DUAL SENSOR (WIRE) ×1 IMPLANT
LOOP CUT BIPOLAR 24F LRG (ELECTROSURGICAL) ×1 IMPLANT
MANIFOLD NEPTUNE II (INSTRUMENTS) ×2 IMPLANT
PACK CYSTO (CUSTOM PROCEDURE TRAY) ×2 IMPLANT
TUBING CONNECTING 10 (TUBING) ×2 IMPLANT
TUBING UROLOGY SET (TUBING) ×2 IMPLANT

## 2018-03-11 NOTE — Anesthesia Postprocedure Evaluation (Signed)
Anesthesia Post Note  Patient: [name]  Procedure(s) Performed: TRANSURETHRAL RESECTION OF BLADDER TUMOR (TURBT) 2-5cm (N/A ) CYSTOSCOPY WITH INSTILLATION OF POST OPERATIVE EPIRUBICIN (N/A )     Patient location during evaluation: PACU Anesthesia Type: General Level of consciousness: awake and alert Pain management: pain level controlled Vital Signs Assessment: post-procedure vital signs reviewed and stable Respiratory status: spontaneous breathing, nonlabored ventilation, respiratory function stable and patient connected to nasal cannula oxygen Cardiovascular status: blood pressure returned to baseline and stable Postop Assessment: no apparent nausea or vomiting Anesthetic complications: no    Last Vitals:  Vitals:   03/11/18 1230 03/11/18 1245  BP: 127/79 116/80  Pulse: 88 98  Resp: 19 20  Temp:  36.8 C  SpO2: 91% 93%    Last Pain:  Vitals:   03/11/18 1245  TempSrc:   PainSc: 0-No pain                 Effie Berkshire

## 2018-03-11 NOTE — Anesthesia Preprocedure Evaluation (Addendum)
Anesthesia Evaluation  Patient identified by MRN, date of birth, ID band Patient awake    Reviewed: Allergy & Precautions, NPO status , Patient's Chart, lab work & pertinent test results  Airway Mallampati: I  TM Distance: >3 FB Neck ROM: Full    Dental  (+) Caps, Dental Advisory Given   Pulmonary COPD,  COPD inhaler and oxygen dependent, former smoker,    breath sounds clear to auscultation       Cardiovascular hypertension, + CAD and + Peripheral Vascular Disease   Rhythm:Regular Rate:Normal     Neuro/Psych PSYCHIATRIC DISORDERS Anxiety  Neuromuscular disease    GI/Hepatic Neg liver ROS, hiatal hernia, GERD  Medicated and Controlled,  Endo/Other  Hypothyroidism   Renal/GU      Musculoskeletal  (+) Arthritis , Osteoarthritis,  Fibromyalgia -  Abdominal Normal abdominal exam  (+)   Peds  Hematology   Anesthesia Other Findings - HLD  Reproductive/Obstetrics                           Lab Results  Component Value Date   WBC 5.3 11/14/2017   HGB 14.6 11/14/2017   HCT 44.5 11/14/2017   MCV 90.3 11/14/2017   PLT 345 11/14/2017   Lab Results  Component Value Date   CREATININE 0.93 01/03/2018   BUN 10 01/03/2018   NA 137 01/03/2018   K 4.3 01/03/2018   CL 101 01/03/2018   CO2 25 01/03/2018   Lab Results  Component Value Date   INR 0.98 03/23/2013   INR 0.94 06/12/2011   INR 0.93 03/07/2011   EKG: normal sinus rhythm.  Echo: - Left ventricle: The cavity size was normal. Systolic function was normal. The estimated ejection fraction was in the range of 55% to 60%. Doppler parameters are consistent with abnormal left ventricular relaxation (grade 1 diastolic dysfunction). - Mitral valve: Calcified annulus. Mildly thickened leaflets .  Anesthesia Physical Anesthesia Plan  ASA: III  Anesthesia Plan: General   Post-op Pain Management:    Induction: Intravenous  PONV Risk Score  and Plan: 4 or greater and Ondansetron, Dexamethasone, Midazolam and Treatment may vary due to age or medical condition  Airway Management Planned: LMA  Additional Equipment: None  Intra-op Plan:   Post-operative Plan: Extubation in OR  Informed Consent: I have reviewed the patients History and Physical, chart, labs and discussed the procedure including the risks, benefits and alternatives for the proposed anesthesia with the patient or authorized representative who has indicated his/her understanding and acceptance.   Dental advisory given  Plan Discussed with: CRNA  Anesthesia Plan Comments:       Anesthesia Quick Evaluation

## 2018-03-11 NOTE — Anesthesia Procedure Notes (Signed)
Procedure Name: LMA Insertion Date/Time: 03/11/2018 10:55 AM Performed by: Pilar Grammes, CRNA Pre-anesthesia Checklist: Patient identified, Emergency Drugs available, Suction available, Patient being monitored and Timeout performed Patient Re-evaluated:Patient Re-evaluated prior to induction Oxygen Delivery Method: Circle system utilized Preoxygenation: Pre-oxygenation with 100% oxygen Induction Type: IV induction Ventilation: Mask ventilation without difficulty LMA: LMA inserted LMA Size: 4.0 Number of attempts: 1 Tube secured with: Tape

## 2018-03-11 NOTE — Op Note (Signed)
Preoperative diagnosis: Bladder neoplasm Postoperative diagnosis: Same   procedure: Cystoscopy, TURBT 2 to 5 cm; instillation of epirubicin In PACU.  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  Findings: On exam under anesthesia the bladder was palpably normal.  On cystoscopy there was a right bladder wall tumor and a smaller tumor behind this.  As well there was some erythema more anterior and going up the right bladder wall.  Total biopsy and resection and fulguration of about 3 cm.  The tumor appeared to be superficial.  I discussed with patient's husband the operative findings and we may consider BCG in the office based on the path. Discussed nature r/b/a to intravesical BCG.   Description of procedure: After consent was obtained the patient brought to the operating room.  After adequate anesthesia she was placed in lithotomy position and prepped and draped in the usual sterile fashion.  A timeout was performed to confirm the patient and procedure.  An exam under anesthesia was performed.  The cystoscope was passed per urethra and the bladder inspected with the 30 degree and 70 degree lens.  I then took the cold cup biopsy forceps and biopsied the right lateral bladder wall which was some erythema adjacent to the tumor but this appeared more benign such as neovascularity.  I then placed the loop after passing the continuous flow sheath with the visual obturator.  The biopsy site was fulgurated.  I then resected the tumor and the smaller tumor behind it.  This was sent as right bladder tumor.  I then tried to resect the base but because of the prior fulguration resection was difficult as the cut current would not fire and cut the tissue.  There were several deep bites and no tumor remaining.  I did send what I could get is right bladder tumor base.  The area was fulgurated and  hemostasis excellent under low pressure.  The right ureteral orifice was not close to being involved.  The bladder was filled and the scope removed a 16 French catheter was placed in left to gravity drainage.  Urine was clear.  Complications: None Blood loss: Minimal  Specimens to pathology: #1 right bladder wall biopsy  #2 right bladder tumor resection #3 right bladder base  Disposition: Patient stable to PACU  Instillation of epirubicin in PACU: Epirubicin 50 mg in 45 cc normal saline was instilled per urethra and left indwelling for 50 minutes.

## 2018-03-11 NOTE — Transfer of Care (Signed)
Immediate Anesthesia Transfer of Care Note  Patient: [name]  Procedure(s) Performed: TRANSURETHRAL RESECTION OF BLADDER TUMOR (TURBT) 2-5cm (N/A ) CYSTOSCOPY WITH INSTILLATION OF POST OPERATIVE EPIRUBICIN (N/A )  Patient Location: PACU  Anesthesia Type:General  Level of Consciousness: alert  and patient cooperative  Airway & Oxygen Therapy: Patient connected to face mask oxygen  Post-op Assessment: Report given to RN and Post -op Vital signs reviewed and stable  Post vital signs: stable  Last Vitals:  Vitals Value Taken Time  BP 134/79 03/11/2018 11:47 AM  Temp    Pulse 92 03/11/2018 11:48 AM  Resp 15 03/11/2018 11:48 AM  SpO2 100 % 03/11/2018 11:48 AM  Vitals shown include unvalidated device data.  Last Pain:  Vitals:   03/11/18 0909  TempSrc:   PainSc: 0-No pain      Patients Stated Pain Goal: 4 (25/49/82 [id number])  Complications: No apparent anesthesia complications

## 2018-03-11 NOTE — Interval H&P Note (Signed)
History and Physical Interval Note:  03/11/2018 10:34 AM  [name]  has presented today for surgery, with the diagnosis of BLADDER NEOPLASM  The various methods of treatment have been discussed with the patient and family. After consideration of risks, benefits and other options for treatment, the patient has consented to  Procedure(s): TRANSURETHRAL RESECTION OF BLADDER TUMOR (TURBT) (N/A) CYSTOSCOPY WITH INSTILLATION OF POST OPERATIVE EPIRUBICIN (N/A) as a surgical intervention .  The patient's history has been reviewed, patient examined, no change in status, stable for surgery.  I have reviewed the patient's chart and labs. I discussed with the patient and husband the nature, potential benefits, risks and alternatives to the above procedures, including side effects of the proposed treatment, the likelihood of the patient achieving the goals of the procedure, and any potential problems that might occur during the procedure or recuperation. All questions answered. Patient elects to proceed. Discussed she may need a foley post-op.      Festus Aloe

## 2018-03-11 NOTE — Discharge Instructions (Signed)
Indwelling Urinary Catheter Care, Adult Taking good care of your catheter will keep it working properly and help prevent problems from developing.  Removal of foley - remove the foley on Friday morning, Mar 14, 2018 as instructed.    How to wear your catheter Attach your catheter to your leg with adhesive tape or a leg strap. Make sure there is no tension on the catheter. If you use adhesive tape, first remove any sticky residue from the previous tape you used. How to wear a drainage bag  You should have received a large overnight drainage bag and a smaller leg bag that fits underneath clothing. You may wear the overnight bag at any time, but you should never wear the smaller leg bag at night.  Always keep the overnight drainage bag below the level of your bladder, but keep it off the floor. When you sleep, hang the bag inside a wastebasket that is covered by a clean plastic bag.  Always wear the leg bag below your knee. Keep the leg bag secure with a leg strap or adhesive tape. How to care for your skin  Clean the skin around the catheter at least once every day.  Shower every day. Do not take baths.  Apply creams, lotions, or ointments to your genital area only as told by your health care provider.  Do not use powders, sprays, or lotions on your genital area. How to clean your catheter and your skin 1. Wash your hands with soap and water. 2. Wet a washcloth in warm water and mild soap. 3. Use the washcloth to clean the skin where the catheter enters your body. Clean downward, wiping away from the catheter in small circles. Do not wipe toward the catheter. This can push bacteria into the urethra and cause infection. 4. Pat the area dry with a clean towel. Make sure to remove all soap. How to care for your drainage bags Empty your drainage bag when it is ?- full, or at least 2-3 times a day. Replace your drainage bag once a month or sooner if it starts to smell bad or look dirty. Do  not clean your drainage bag unless told by your health care provider. Emptying a drainage bag  Supplies Needed  Rubbing alcohol.  Gauze pad or cotton ball.  Adhesive tape or a leg strap.  Steps 1. Wash your hands with soap and water. 2. Detach the drainage bag from your leg. 3. Hold the drainage bag over the toilet or a clean container. Keep the drainage bag below your hips and bladder. This stops urine from going back into the tubing and into your bladder. 4. Open the pour spout at the bottom of the bag. 5. Empty the urine into the toilet or container. Do not let the pour spout touch any surface. This helps keep bacteria out of the bag and helps prevent infection. 6. Apply rubbing alcohol to a gauze pad or cotton ball. 7. Use the gauze pad or cotton ball to clean the pour spout. 8. Close the pour spout. 9. Attach the bag to your leg with adhesive tape or a leg strap. 10. Wash your hands.  Changing a drainage bag Supplies Needed  Alcohol wipes.  A clean drainage bag.  Adhesive tape or a leg strap.  Steps 1. Wash your hands with soap and water. 2. Detach the dirty drainage bag from your leg. 3. Pinch the rubber catheter with your fingers so that urine does not spill out. 4. Disconnect the  catheter tube from the drainage tube at the connection valve. Do not let the tubes touch any surface. 5. Clean the end of the catheter tube with an alcohol wipe. Use a different alcohol wipe to clean the end of the drainage tube. 6. Connect the catheter tube to the drainage tube of the clean drainage bag. 7. Attach the new bag to your leg with adhesive tape or a leg strap. Avoid attaching the new bag too tightly. 8. Wash your hands.  How to prevent infection and other problems  Never pull on your catheter or try to remove it. Pulling can damage your internal tissues.  Always wash your hands before and after handling your catheter.  If a leg strap gets wet, replace it with a dry  one.  Drink enough fluids to keep your urine clear or pale yellow, or as told by your health care provider.  Do not let the drainage bag or tubing touch the floor.  Wear cotton underwear. Cotton absorbs moisture and keeps your skin dry.  If you are female, wipe from front to back after each bowel movement.  Check the catheter often to make sure that it works properly and the tubing is not twisted or curled. Contact a health care provider if:  Your urine is cloudy.  Your urine smells unusually bad.  Your urine is not draining into the bag.  Your catheter gets clogged.  Your catheter starts to leak.  Your bladder feels full. Get help right away if:  You have redness, swelling, or pain where the catheter enters your body.  You have fluid, pus, or a bad smell coming from the area where the catheter enters your body.  The area where the catheter enters your body feels warm to the touch.  You have a fever.  You have pain in your abdomen, legs, lower back, or bladder.  You see blood fill the catheter.  Your urine is pink or red.  You have nausea, vomiting, or chills.  Your catheter gets pulled out. This information is not intended to replace advice given to you by your health care provider. Make sure you discuss any questions you have with your health care provider. Document Released: 10/01/2005 Document Revised: 08/29/2016 Document Reviewed: 03/16/2014 Elsevier Interactive Patient Education  2018 [employer].   Cystoscopy, Care After Refer to this sheet in the next few weeks. These instructions provide you with information about caring for yourself after your procedure. Your health care provider may also give you more specific instructions. Your treatment has been planned according to current medical practices, but problems sometimes occur. Call your health care provider if you have any problems or questions after your procedure. What can I expect after the  procedure? After the procedure, it is common to have:  Mild pain when you urinate. Pain should stop within a few minutes after you urinate. This may last for up to 1 week.  A small amount of blood in your urine for several days.  Feeling like you need to urinate but producing only a small amount of urine.  Follow these instructions at home:  Medicines  Take over-the-counter and prescription medicines only as told by your health care provider.  If you were prescribed an antibiotic medicine, take it as told by your health care provider. Do not stop taking the antibiotic even if you start to feel better. General instructions   Return to your normal activities as told by your health care provider. Ask your health care  provider what activities are safe for you.  Do not drive for 24 hours if you received a sedative.  Watch for any blood in your urine. If the amount of blood in your urine increases, call your health care provider.  Follow instructions from your health care provider about eating or drinking restrictions.  If a tissue sample was removed for testing (biopsy) during your procedure, it is your responsibility to get your test results. Ask your health care provider or the department performing the test when your results will be ready.  Drink enough fluid to keep your urine clear or pale yellow.  Keep all follow-up visits as told by your health care provider. This is important. Contact a health care provider if:  You have pain that gets worse or does not get better with medicine, especially pain when you urinate.  You have difficulty urinating. Get help right away if:  You have more blood in your urine.  You have blood clots in your urine.  You have abdominal pain.  You have a fever or chills.  You are unable to urinate. This information is not intended to replace advice given to you by your health care provider. Make sure you discuss any questions you have with your  health care provider. Document Released: 04/20/2005 Document Revised: 03/08/2016 Document Reviewed: 08/18/2015 Elsevier Interactive Patient Education  2018 [place].    Transurethral Resection of Bladder Tumor, Care After Refer to this sheet in the next few weeks. These instructions provide you with information about caring for yourself after your procedure. Your health care provider may also give you more specific instructions. Your treatment has been planned according to current medical practices, but problems sometimes occur. Call your health care provider if you have any problems or questions after your procedure. What can I expect after the procedure? After the procedure, it is common to have:  A small amount of blood in your urine for up to 2 weeks.  Soreness or mild discomfort from your catheter. After your catheter is removed, you may have mild soreness, especially when urinating.  Pain in your lower abdomen.  Follow these instructions at home:  Medicines  Take over-the-counter and prescription medicines only as told by your health care provider.  Do not drive or operate heavy machinery while taking prescription pain medicine.  Do not drive for 24 hours if you received a sedative.  If you were prescribed antibiotic medicine, take it as told by your health care provider. Do not stop taking the antibiotic even if you start to feel better. Activity  Return to your normal activities as told by your health care provider. Ask your health care provider what activities are safe for you.  Do not lift anything that is heavier than 10 lb (4.5 kg) for as long as told by your health care provider.  Avoid intense physical activity for as long as told by your health care provider.  Walk at least one time every day. This helps to prevent blood clots. You may increase your physical activity gradually as you start to feel better. General instructions  Do not drink alcohol for as  long as told by your health care provider. This is especially important if you are taking prescription pain medicines.  Do not take baths, swim, or use a hot tub until your health care provider approves.  If you have a catheter, follow instructions from your health care provider about caring for your catheter and your drainage bag.  Drink  enough fluid to keep your urine clear or pale yellow.  Wear compression stockings as told by your health care provider. These stockings help to prevent blood clots and reduce swelling in your legs.  Keep all follow-up visits as told by your health care provider. This is important. Contact a health care provider if:  You have pain that gets worse or does not improve with medicine.  You have blood in your urine for more than 2 weeks.  You have cloudy or bad-smelling urine.  You become constipated. Signs of constipation may include having: ? Fewer than three bowel movements in a week. ? Difficulty having a bowel movement. ? Stools that are dry, hard, or larger than normal.  You have a fever. Get help right away if:  You have: ? Severe pain. ? Bright-red blood in your urine. ? Blood clots in your urine. ? A lot of blood in your urine.  Your catheter has been removed and you are not able to urinate.  You have a catheter in place and the catheter is not draining urine. This information is not intended to replace advice given to you by your health care provider. Make sure you discuss any questions you have with your health care provider. Document Released: 09/12/2015 Document Revised: 06/03/2016 Document Reviewed: 06/23/2015 Elsevier Interactive Patient Education  2018 [employer].

## 2018-03-12 ENCOUNTER — Encounter (HOSPITAL_COMMUNITY): Payer: Self-pay | Admitting: Urology

## 2018-03-21 ENCOUNTER — Other Ambulatory Visit: Payer: Self-pay | Admitting: Family Medicine

## 2018-03-21 DIAGNOSIS — F419 Anxiety disorder, unspecified: Secondary | ICD-10-CM

## 2018-03-21 DIAGNOSIS — G4700 Insomnia, unspecified: Secondary | ICD-10-CM

## 2018-03-21 DIAGNOSIS — E039 Hypothyroidism, unspecified: Secondary | ICD-10-CM

## 2018-03-21 NOTE — Telephone Encounter (Signed)
Trazodone last filled 03/11/18, #10 x 0rf Lorazepam last filled 12/27/17, #30 x 1rf  Last OV 01/03/18  Please advise if can be refilled. Thanks.

## 2018-03-22 DIAGNOSIS — J449 Chronic obstructive pulmonary disease, unspecified: Secondary | ICD-10-CM | POA: Diagnosis not present

## 2018-03-24 ENCOUNTER — Encounter: Payer: Self-pay | Admitting: Nurse Practitioner

## 2018-03-24 ENCOUNTER — Ambulatory Visit: Payer: Medicare Other | Admitting: Nurse Practitioner

## 2018-03-24 VITALS — BP 152/88 | HR 81 | Ht 66.0 in | Wt 144.0 lb

## 2018-03-24 DIAGNOSIS — E7800 Pure hypercholesterolemia, unspecified: Secondary | ICD-10-CM

## 2018-03-24 DIAGNOSIS — I2510 Atherosclerotic heart disease of native coronary artery without angina pectoris: Secondary | ICD-10-CM | POA: Diagnosis not present

## 2018-03-24 DIAGNOSIS — I10 Essential (primary) hypertension: Secondary | ICD-10-CM

## 2018-03-24 MED ORDER — ATORVASTATIN CALCIUM 80 MG PO TABS: ORAL_TABLET | ORAL | 3 refills | Status: DC

## 2018-03-24 NOTE — Progress Notes (Signed)
CARDIOLOGY OFFICE NOTE  Date:  03/24/2018    [name] Date of Birth: [birth year] Medical Record #[id number]  PCP:  [place], [name], MD  Cardiologist:  Servando Snare     Chief Complaint  Patient presents with  . Coronary Artery Disease    8 month check.     History of Present Illness: [name] is a 72 y.o. female who presents today for an 8 month check. 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 She has elected to see me going forward.   She has a history of smoking, HTN, COPD, right breast cancer, hyperlipidemia, PAD, and mesenteric vascular disease. Patient had been having nocturnal and post-prandial abdominal cramping. She had a mesenteric angiogram done in 5/12 by Dr. Bridgett Larsson, showing occluded celiac and SMA arteries with patent IMA. She was planned for aorto-mesenteric bypass. She was referred for cardiology evaluation prior to surgery. However, it was ultimately decided not to treat her mesenteric disease surgically.   Given her exertional chest tightness and shortness of breath as well as known vascular disease, Dr. Aundra Dubin set her up for left heart cath. This was done in 5/12 and showed a 70-80% ostial stenosis of a small to moderate 2nd diagonal. This was too small to intervene on and not likely to be particularly symptomatic. Lower extremity arterial dopplers showed a severe focal proximal right SFA stenosis with collaterals from the PFA. She had a VATS for a right-sided lung nodule that showed granulomatous inflammation (no cancer). She quit smoking. She saw Dr. Fletcher Anon for PAD evaluation, and it was decided to treat her medically. She tried cilostazol but did not see much difference with its use so stopped it.   I saw her back November of 2017 - she was doing well. Was back on Pletal. Not smoking. Was referred here back last June due to leg swelling. Duplex negative but she had been referred to [facility] to also discuss possible aortogram.  She had had a significant rash - saw  Rheumatology - not felt to have lupus. Her cardiac status fortunately, felt to be ok. I did want to get her echo updated at last visit - but looks like this was never completed however.  Last visit with me was last in November of 2018. Her cardiac status was felt to be ok.   Comes in today. [place]. Has had hematuria - had surgery last month and has bladder tumor. She tells me she was given a round of chemo at the time of surgery. There was concern this may be related to the breast cancer. She will have a re-evaluation in 3 months. She feels like she has done well - however, she missed her last visit with me last month - [place] days of chest pain while there - did not seek evaluation - pretty constant - described as a "tight band" - then just went away. It has not recurred since. She feels like she has been pretty active - doing lots of heavy gardening without any issue whatsover. She is not smoking. Remains on oxygen at night. No real issue with her legs. She still has these occasional blisters that "pop up" - may be seeing new dermatologist. Not fasting today - had some little ham biscuits.   PMH: 1. Hypothyroidism 2. HTN 3. GERD 4. Hypothyroidism 5. OSA 6. Venous insufficiency 7. 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 The  IMA was patent. CTA abdomen (6/14) showed occluded celiac and SMA with distal reconstitution, no changes to suggest bowel infarction or ischemia.  8. Hyperlipidemia 9. COPD: PFTs (6/12) with moderate obstructive airways disease and response to bronchodilator.  10. Hyperlipidemia 11. Fibromyalgia 12. Breast cancer s/p mastectomy and chemotherapy in 2010.  13. CAD: LHC (5/12) with 70-80% ostial stenosis of a small-moderate 2nd diagonal. This was too small to intervene upon and unlikely to cause significant symptoms.  14. PAD: Peripheral  arterial dopplers (5/12) with severe focal proximal right SFA stenosis. There were collaterals from the PFA. Patient has claudication symptoms. Peripheral arterial dopplers in 5/13 were stable compared to 5/12. Peripheral arterial dopplers (8/14) with ABIs 0.71 on right, 1.1 on left (known right SFA occlusion).  15. 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 VATS 8/12 for right upper lobe nodule showed granulomatous material.    Past Medical History:  Diagnosis Date  . Bilateral leg cramps   . Bladder neoplasm   . Chronic deep vein thrombosis (DVT) of right lower extremity ([place])    05/ 2018  chronic non-occlusive DVT RLE  . COPD, frequent exacerbations ([place])    03-06-2018 per pt last exacerbation 12/ 2018  . Coronary artery disease    cardiologist-  dr Aundra Dubin-- 03-11-2018 er cath , 80% stenosis in the ostial second diagonal  . Dyspnea on minimal exertion   . Emphysema/COPD ([place])    CAT score- 17  . Fibromyalgia 1995  . GERD (gastroesophageal reflux disease)   . Hiatal hernia   . History of diverticulitis of colon    2002  s/p  sigmoid colectomy  . History of multiple pulmonary nodules    hx RUL nodules x2  s/p right VATS w/ wedge resection 09-11-2005 and 06-14-2011  both  necrotizing granulomatous inflammation w/ cystic area of necrosis & focal calcification  . History of right breast cancer oncologist-  dr Jana Hakim-- no recurrence   dx 2010--  IDC, Stage IA , ER/PR+,  (pT1c pN0) 11-09-2008 right lumpectomy;  12-18-2008 right simple mastectomy for DCIS margins;  completed chemotherapy 2010 (no radiation) and completed antiestrogen therapy  . Hx of colonic polyps    Dr. Cristina Gong -last study '11  . Hyperlipidemia   . Hypertension   . Hypothyroidism   . 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 Nocturia   . OA (osteoarthritis)    thumbs  . On supplemental oxygen therapy    03-06-2018 per pt uses only  at night,  checks O2 sats at home,  stated am sat 89% after moving around average 93-94% with RA  . Osteoporosis   . Peripheral arterial occlusive disease [facility]) vascular-- dr chen/ dr Donzetta Matters   proximal right SFA severe focal stenosis with collaterals from the PFA/  04/ 2012 occluded celiac and SMA arteries with distal reconstitution w/ patent IMA  . Peripheral vascular disease ([place])    chronic DVT RLE,  mesenteric vascular disease  . Right lower lobe pulmonary nodule    Chest CT 08-29-2017  . Stage 3 severe COPD by GOLD classification ([place]) hx frequent exacerbations--   pulmologist-  dr Maryann Alar--  per lov note , dated 12-20-2017, oxyogen 2L is prescribed for use with exertion (but pt only uses mostly at night), O2 sats on RA run the 80s , this day sat 86% RA and with 2L O2 sat 92%  . Varicose veins of leg with swelling  varicose vein surgery - Dr. Aleda Grana  . Wears glasses   . Wears hearing aid in both ears     Past Surgical History:  Procedure Laterality Date  . BREAST LUMPECTOMY W/ NEEDLE LOCALIZATION Right 11-09-2008    dr Brantley Stage   [facility]  . CARDIAC CATHETERIZATION  03-12-2011   dr Aundra Dubin   70-80% ostial stenosis in small second diagonal (appears to small for intervention),  dLAD 40-50%,  minimal luminal irregulartieis involoving LCFx and RCA,  LVEF 55%  . CATARACT EXTRACTION W/ INTRAOCULAR LENS  IMPLANT, BILATERAL  2011  . CYSTOSCOPY N/A 03/11/2018   Procedure: CYSTOSCOPY WITH INSTILLATION OF POST OPERATIVE EPIRUBICIN;  Surgeon: Festus Aloe, MD;  Location: [facility];  Service: Urology;  Laterality: N/A;  . PARTIAL COLECTOMY  2002   sigmoid and appectomy (diverticulitis)  . PARTIAL MASTECTOMY WITH NEEDLE LOCALIZATION Left 02/23/2013   Procedure:  LEFT PARTIAL MASTECTOMY WITH NEEDLE LOCALIZATION;  Surgeon: Adin Hector, MD;  Location: [place];  Service: General;  Laterality: Left;  . RECONSTRUCTION BREAST W/ LATISSIMUS DORSI FLAP Right 05-30-2009   dr Harlow Mares   [facility]  . SIMPLE MASTECTOMY Right 12-28-2008    dr Brantley Stage  [facility]  . TRANSTHORACIC ECHOCARDIOGRAM  07-09-2016  dr Aundra Dubin   ef 55-60%, grade 1 diastoic dysfunction/  mild TR  . TRANSURETHRAL RESECTION OF BLADDER TUMOR N/A 03/11/2018   Procedure: TRANSURETHRAL RESECTION OF BLADDER TUMOR (TURBT) 2-5cm;  Surgeon: Festus Aloe, MD;  Location: [facility];  Service: Urology;  Laterality: N/A;  . VAGINAL HYSTERECTOMY  1973  . VIDEO ASSISTED THORACOSCOPY (VATS)/WEDGE RESECTION Right 09-11-2005  &  06-14-2011   dr hendrickso  [facility]   both RUL     Medications: Current Meds  Medication Sig  . albuterol (PROVENTIL HFA;VENTOLIN HFA) 108 (90 Base) MCG/ACT inhaler Inhale 2 puffs into the lungs every 4 (four) hours as needed for wheezing. As a RESCUE medication  . amitriptyline (ELAVIL) 50 MG tablet TAKE 1 TABLET BY MOUTH EVERY DAY. (Patient taking differently: TAKE 1 TABLET BY MOUTH EVERY DAY.--- takes in qhs)  . aspirin 81 MG tablet Take 81 mg by mouth every evening.   Marland Kitchen atorvastatin (LIPITOR) 80 MG tablet One tablet daily  . Biotin 1000 MCG tablet Take 1,000 mg by mouth daily.  . cilostazol (PLETAL) 100 MG tablet TAKE 1 TABLET (100 MG TOTAL) BY MOUTH 2 (TWO) TIMES DAILY. (Patient taking differently: Take 100 mg by mouth at bedtime. )  . docusate sodium (COLACE) 100 MG capsule Take 100 mg by mouth at bedtime.  . fluticasone (FLONASE) 50 MCG/ACT nasal spray USE 2 SPRAYS IN BOTH NOSTRILS DAILY. (Patient taking differently: USE 2 SPRAYS IN BOTH NOSTRILS DAILY.--- twice daily)  . Fluticasone-Umeclidin-Vilant (TRELEGY ELLIPTA) 100-62.5-25 MCG/INH AEPB Inhale 1 puff into the lungs daily. (Patient taking differently: Inhale 1 puff into the lungs every morning. )  . guaiFENesin (MUCINEX) 600 MG 12 hr tablet Take 600 mg by mouth 2 (two) times daily as needed.   Marland Kitchen ipratropium-albuterol (DUONEB) 0.5-2.5 (3) MG/3ML SOLN USE 1 NEB 3 TIMES DAILY FOR 3 DAYS, THEN EVERY 6 HOURS AS NEEDED.  Marland Kitchen IRON PO Take 85 mg by mouth  daily.  Marland Kitchen levothyroxine (SYNTHROID, LEVOTHROID) 112 MCG tablet TAKE 1 TABLET (112 MCG TOTAL) BY MOUTH DAILY BEFORE BREAKFAST.  Marland Kitchen LORazepam (ATIVAN) 2 MG tablet TAKE 1 TABLET AT BEDTIME FOR ANXIETY.  Marland Kitchen omeprazole (PRILOSEC) 40 MG capsule TAKE 1 CAPSULE (40 MG TOTAL) BY MOUTH 2 (TWO) TIMES DAILY. (Patient taking differently:  Take 40 mg by mouth 2 (two) times daily. )  . roflumilast (DALIRESP) 500 MCG TABS tablet Take 1 tablet (500 mcg total) by mouth daily.  . traMADol (ULTRAM) 50 MG tablet Take 1 tablet (50 mg total) by mouth every 6 (six) hours as needed.  . traZODone (DESYREL) 50 MG tablet Take 0.5-1 tablets (25-50 mg total) by mouth at bedtime as needed for sleep.  . [DISCONTINUED] atorvastatin (LIPITOR) 80 MG tablet TAKE 1 TABLET EVERY DAY. PLEASE KEEP UPCOMING APPT.THANK YOU (Patient taking differently: TAKE 1 TABLET EVERY DAY. PLEASE KEEP UPCOMING APPT.THANK YOU--- takes in pm)     Allergies: Allergies  Allergen Reactions  . Sulfa Antibiotics Swelling    Social History: The patient  reports that she quit smoking about 2 years ago. Her smoking use included cigarettes. She has a 37.50 pack-year smoking history. She has never used smokeless tobacco. She reports that she drinks about 6.0 oz of alcohol per week. She reports that she does not use drugs.   Family History: The patient's family history includes COPD in her mother; Cancer in her sister and sister; Colon cancer in her mother; Coronary artery disease in her brother and father; Diabetes in her mother; Emphysema in her mother; Heart attack in her brother and father; Heart disease in her father; Heart failure in her sister; Hypertension in her mother, sister, and sister; Melanoma in her sister; Pancreatic cancer in her sister; Sudden death in her father; Throat cancer in her sister.   Review of Systems: Please see the history of present illness.   Otherwise, the review of systems is positive for none.   All other systems are reviewed  and negative.   Physical Exam: VS:  BP (!) 152/88   Pulse 81   Ht 5\' 6"  (1.676 m)   Wt 144 lb (65.3 kg)   SpO2 95%   BMI 23.24 kg/m  .  BMI Body mass index is 23.24 kg/m.  Wt Readings from Last 3 Encounters:  03/24/18 144 lb (65.3 kg)  03/11/18 141 lb (64 kg)  01/03/18 150 lb 8 oz (68.3 kg)    General: Pleasant. Well developed, well nourished and in no acute distress.   HEENT: Normal.  Neck: Supple, no JVD, carotid bruits, or masses noted.  Cardiac: Regular rate and rhythm. No murmurs, rubs, or gallops. No edema.  Respiratory:  Lungs are clear to auscultation bilaterally with normal work of breathing.  GI: Soft and nontender.  MS: No deformity or atrophy. Gait and ROM intact.  Skin: Warm and dry. Color is normal.  Neuro:  Strength and sensation are intact and no gross focal deficits noted.  Psych: Alert, appropriate and with normal affect.   LABORATORY DATA:  EKG:  EKG is not ordered today.  Lab Results  Component Value Date   WBC 6.8 03/11/2018   HGB 16.3 (H) 03/11/2018   HCT 47.1 (H) 03/11/2018   PLT 347 03/11/2018   GLUCOSE 110 (H) 03/11/2018   CHOL 164 08/27/2017   TRIG 86 08/27/2017   HDL 74 08/27/2017   LDLDIRECT 88.0 08/18/2013   LDLCALC 73 08/27/2017   ALT 17 11/14/2017   AST 18 11/14/2017   NA 137 03/11/2018   K 4.3 03/11/2018   CL 104 03/11/2018   CREATININE 0.79 03/11/2018   BUN 12 03/11/2018   CO2 21 (L) 03/11/2018   TSH 3.47 01/03/2018   INR 0.98 03/23/2013   HGBA1C 5.7 (H) 01/03/2018     BNP (last 3 results) No results  for input(s): BNP in the last 8760 hours.  ProBNP (last 3 results) No results for input(s): PROBNP in the last 8760 hours.   Other Studies Reviewed Today:  CT ANGIO BIFEMIMPRESSION6/2018: VASCULAR  Moderate atherosclerotic disease throughout the aorta and common iliac vessels.  Near complete occlusion of the celiac artery and complete occlusion of the proximal SMA. Reconstitution of the SMA through  IMA.  Moderate disease throughout the right superficial femoral artery and trifurcation vessels in the right calf. Mild disease within the left posterior tibial artery and proximal anterior tibial artery, otherwise patent left lower extremity runoff.  No visible deep venous thrombosis.  NON-VASCULAR  Moderate-sized hiatal hernia.  No acute findings in the abdomen or pelvis.  Subcutaneous edema/swelling in the right lower extremity from the mid thigh through the calf.   Electronically Signed By: Rolm Baptise M.D. On: 03/16/2017 18:22  Summary:  - No evidence of deep vein thrombosis involving the right lower extremity. - No evidence of Baker&'s cyst on the right.  Other specific details can be found in the table(s) above. Prepared and Electronically Authenticated by  Jessy Oto. Fields MD 2018-06-03T12:09:54   02/27/2017: Vascular US Lower Extremities Evidence of right lower extremity chronic, non occlusive, deep venous thrombus in one of the paired peroneal veins with incompetency.  No evidence of left lower extremity deep or superficial venous thrombus or incompetence.  Echo Study Conclusions 06/2016  - Left ventricle: The cavity size was normal. Systolic function was normal. The estimated ejection fraction was in the range of 55% to 60%. Doppler parameters are consistent with abnormal left ventricular relaxation (grade 1 diastolic dysfunction). - Mitral valve: Calcified annulus. Mildly thickened leaflets .   Assessment/Plan:  1.Chest pain - EKG today ok - she has nonobstructive disease - no recent evaluation - symptoms still worrisome especially with her multiple CV risk factors. I will get Lexiscan to further evaluate. Currently without symptoms.   2. PAD - [facility]. - currently she feels like she is doing ok.   3. Prior leg ulcer - resolved.   4. Mesenteric ischemia: She has been managed medicallyover the past  several years.No real complaints today  5. COPD: followed by pulmonary.She has stopped smoking.She is on oxygen at night.  6. HTN - BP ok on current regimen - no changes made today.   7. HLD - not fasting - will get on day of her stress test.    Current medicines are reviewed with the patient today.  The patient does not have concerns regarding medicines other than what has been noted above.  The following changes have been made:  See above.  Labs/ tests ordered today include:    Orders Placed This Encounter  Procedures  . Basic metabolic panel  . Hepatic function panel  . Lipid panel  . MYOCARDIAL PERFUSION IMAGING  . EKG 12-Lead     Disposition:   FU with me tentatively in 6 months.    Patient is agreeable to this plan and will call if any problems develop in the interim.   SignedTruitt Merle, NP  03/24/2018 12:25 PM  [facility] [address] [place], [place]  [address] Phone: [phone] Fax: [phone]

## 2018-03-24 NOTE — Patient Instructions (Addendum)
We will be checking the following labs today - NONE  Fasting labs on day of stress test   Medication Instructions:    Continue with your current medicines.   I sent in a refill for your cholesterol medicine    Testing/Procedures To Be Arranged:  Lexiscan Myoview  Follow-Up:   See me tentatively in 6 months.     Other Special Instructions:  You are scheduled for a Myocardial Perfusion Imaging Study on ____________________________________ at _______________________________________________-.   Please arrive 15 minutes prior to your appointment time for registration and insurance purposes.   The test will take approximately 3 to 4 hours to complete; you may bring reading material. If someone comes with you to your appointment, they will need to remain in the main lobby due to limited space in the testing area.   If you are pregnant or breastfeeding, please notify the nuclear lab prior to your appointment.   How to prepare for your Myocardial Perfusion test:   Do not eat or drink 3 hours prior to your test, except you may have water.    Do not consume products containing caffeine (regular or decaffeinated) 12 hours prior to your test (ex: coffee, chocolate, soda, tea)   Do bring a list of your current medications with you. If not listed below, you may take your medications as normal.    Bring any held medication to your appointment, as you may be required to take it once the test is complete.   Do wear comfortable clothes (no dresses or overalls) and walking shoes. Tennis shoes are preferred. No heels or open toed shoes.  Do not wear cologne, perfume, aftershave or lotions (deodorant is allowed).   If these instructions are not followed, you test will have to be rescheduled.   Please report to [address] for your test. If you have questions or concerns about your appointment, please call the Nuclear Lab at [phone].  If you cannot keep your  appointment, please provide 24 hour notification to the Nuclear lab to avoid a possible $50 charge to your account.     If you need a refill on your cardiac medications before your next appointment, please call your pharmacy.   Call the [place] office at [phone] if you have any questions, problems or concerns.

## 2018-03-27 ENCOUNTER — Telehealth (HOSPITAL_COMMUNITY): Payer: Self-pay | Admitting: *Deleted

## 2018-03-27 NOTE — Telephone Encounter (Signed)
Left message on voicemail per DPR in reference to upcoming appointment scheduled on 04/01/18 with detailed instructions given per Myocardial Perfusion Study Information Sheet for the test. LM to arrive 15 minutes early, and that it is imperative to arrive on time for appointment to keep from having the test rescheduled. If you need to cancel or reschedule your appointment, please call the office within 24 hours of your appointment. Failure to do so may result in a cancellation of your appointment, and a $50 no show fee. Phone number given for call back for any questions. Kirstie Peri

## 2018-04-01 ENCOUNTER — Other Ambulatory Visit: Payer: Medicare Other

## 2018-04-01 ENCOUNTER — Ambulatory Visit (HOSPITAL_COMMUNITY): Payer: Medicare Other | Attending: Cardiology

## 2018-04-01 ENCOUNTER — Telehealth: Payer: Self-pay

## 2018-04-01 ENCOUNTER — Other Ambulatory Visit: Payer: Self-pay

## 2018-04-01 DIAGNOSIS — I2510 Atherosclerotic heart disease of native coronary artery without angina pectoris: Secondary | ICD-10-CM

## 2018-04-01 DIAGNOSIS — E7800 Pure hypercholesterolemia, unspecified: Secondary | ICD-10-CM

## 2018-04-01 DIAGNOSIS — I25119 Atherosclerotic heart disease of native coronary artery with unspecified angina pectoris: Secondary | ICD-10-CM

## 2018-04-01 LAB — LIPID PANEL
Chol/HDL Ratio: 2.2 ratio (ref 0.0–4.4)
Cholesterol, Total: 204 mg/dL — ABNORMAL HIGH (ref 100–199)
HDL: 91 mg/dL (ref >39)
LDL Calculated: 99 mg/dL (ref 0–99)
Triglycerides: 70 mg/dL (ref 0–149)
VLDL Cholesterol Cal: 14 mg/dL (ref 5–40)

## 2018-04-01 LAB — BASIC METABOLIC PANEL
BUN/Creatinine Ratio: 16 (ref 12–28)
BUN: 15 mg/dL (ref 8–27)
CO2: 19 mmol/L — ABNORMAL LOW (ref 20–29)
Calcium: 9.7 mg/dL (ref 8.7–10.3)
Chloride: 98 mmol/L (ref 96–106)
Creatinine, Ser: 0.92 mg/dL (ref 0.57–1.00)
GFR calc Af Amer: 71 mL/min/{1.73_m2} (ref >59)
GFR calc non Af Amer: 62 mL/min/{1.73_m2} (ref >59)
Glucose: 93 mg/dL (ref 65–99)
Potassium: 6.5 mmol/L (ref 3.5–5.2)
Sodium: 132 mmol/L — ABNORMAL LOW (ref 134–144)

## 2018-04-01 LAB — MYOCARDIAL PERFUSION IMAGING
LV dias vol: 65 mL (ref 46–106)
LV sys vol: 25 mL
Peak HR: 97 {beats}/min
RATE: 0.36
Rest HR: 83 {beats}/min
SDS: 4
SRS: 6
SSS: 10
TID: 1.15

## 2018-04-01 LAB — HEPATIC FUNCTION PANEL
ALT: 22 IU/L (ref 0–32)
AST: 45 IU/L — ABNORMAL HIGH (ref 0–40)
Albumin: 4.5 g/dL (ref 3.5–4.8)
Alkaline Phosphatase: 69 IU/L (ref 39–117)
Bilirubin Total: 0.5 mg/dL (ref 0.0–1.2)
Bilirubin, Direct: 0.08 mg/dL (ref 0.00–0.40)
Total Protein: 7.5 g/dL (ref 6.0–8.5)

## 2018-04-01 MED ORDER — TECHNETIUM TC 99M TETROFOSMIN IV KIT
30.8000 | PACK | Freq: Once | INTRAVENOUS | Status: AC | PRN
  Administered 2018-04-01: 30.8 via INTRAVENOUS
  Filled 2018-04-01: qty 31

## 2018-04-01 MED ORDER — REGADENOSON 0.4 MG/5ML IV SOLN
0.4000 mg | Freq: Once | INTRAVENOUS | Status: AC
Start: 2018-04-01 — End: 2018-04-01
  Administered 2018-04-01: 0.4 mg via INTRAVENOUS

## 2018-04-01 MED ORDER — TECHNETIUM TC 99M TETROFOSMIN IV KIT
10.5000 | PACK | Freq: Once | INTRAVENOUS | Status: AC | PRN
  Administered 2018-04-01: 10.5 via INTRAVENOUS
  Filled 2018-04-01: qty 11

## 2018-04-01 NOTE — Telephone Encounter (Signed)
Received a call from [employer] Santiago Glad) informing us that the lab drawn this morning was hemolyzed and gave an inaccurate "K" reading of 6.5.  I called the patient and left her a message that she would need to come back for a redraw lab check.

## 2018-04-02 ENCOUNTER — Other Ambulatory Visit: Payer: Self-pay

## 2018-04-02 ENCOUNTER — Other Ambulatory Visit: Payer: Medicare Other | Admitting: *Deleted

## 2018-04-02 DIAGNOSIS — I25119 Atherosclerotic heart disease of native coronary artery with unspecified angina pectoris: Secondary | ICD-10-CM

## 2018-04-02 LAB — BASIC METABOLIC PANEL
BUN/Creatinine Ratio: 11 — ABNORMAL LOW (ref 12–28)
BUN: 9 mg/dL (ref 8–27)
CO2: 23 mmol/L (ref 20–29)
Calcium: 10 mg/dL (ref 8.7–10.3)
Chloride: 97 mmol/L (ref 96–106)
Creatinine, Ser: 0.79 mg/dL (ref 0.57–1.00)
GFR calc Af Amer: 86 mL/min/{1.73_m2} (ref >59)
GFR calc non Af Amer: 74 mL/min/{1.73_m2} (ref >59)
Glucose: 95 mg/dL (ref 65–99)
Potassium: 5.2 mmol/L (ref 3.5–5.2)
Sodium: 135 mmol/L (ref 134–144)

## 2018-04-02 NOTE — Telephone Encounter (Signed)
Lpmtcb 6/19

## 2018-04-02 NOTE — Telephone Encounter (Signed)
Spoke with patient and confirmed with her that she will be coming in for a lab redraw due to hemolyzed results on 6/18.  She verbalized understanding and is on her way.

## 2018-04-03 ENCOUNTER — Other Ambulatory Visit: Payer: Self-pay | Admitting: *Deleted

## 2018-04-03 DIAGNOSIS — E7800 Pure hypercholesterolemia, unspecified: Secondary | ICD-10-CM

## 2018-04-03 DIAGNOSIS — E875 Hyperkalemia: Secondary | ICD-10-CM

## 2018-04-03 MED ORDER — EZETIMIBE 10 MG PO TABS: 10.0000 mg | ORAL_TABLET | Freq: Every day | ORAL | 9 refills | Status: DC

## 2018-04-15 DIAGNOSIS — L298 Other pruritus: Secondary | ICD-10-CM | POA: Diagnosis not present

## 2018-04-21 DIAGNOSIS — J449 Chronic obstructive pulmonary disease, unspecified: Secondary | ICD-10-CM | POA: Diagnosis not present

## 2018-04-29 ENCOUNTER — Other Ambulatory Visit: Payer: Self-pay

## 2018-05-05 ENCOUNTER — Ambulatory Visit
Admission: RE | Admit: 2018-05-05 | Discharge: 2018-05-05 | Disposition: A | Discharge status: Home / Self Care | Payer: Medicare Other | Source: Ambulatory Visit | Attending: Adult Health | Admitting: Adult Health

## 2018-05-05 DIAGNOSIS — Z1231 Encounter for screening mammogram for malignant neoplasm of breast: Secondary | ICD-10-CM | POA: Diagnosis not present

## 2018-05-14 ENCOUNTER — Other Ambulatory Visit: Payer: Self-pay | Admitting: *Deleted

## 2018-05-14 NOTE — Patient Outreach (Signed)
[facility]) Care Management  05/14/2018  [name] 1946-03-18 [id number]   [place] attempted #[phone] up outreach screening call to patient.  Patient was unavailable. HIPPA compliance voicemail message left with return callback number.  Plan: RN will call patient again within 3-5  days.  [place] Care Management [phone]

## 2018-05-16 ENCOUNTER — Other Ambulatory Visit: Payer: Self-pay | Admitting: *Deleted

## 2018-05-16 NOTE — Patient Outreach (Signed)
[facility]) Care Management  05/16/2018   [name] 27-Dec-1945 [id number] Referral Date: [id number] Referral Source: EMMI Campaign Referral reason : Engagement score of 7 Insurance: [facility]  PMH: COPD,HTN,Ca of bladder, GERD,Hypothyroidism and elevated Lipids  RN Health Coach telephone call to patient.  Hipaa compliance verified. Per patient she will be undergoing radiation therapy and chemo for bladder cancer. Patient stated she has COPD and has been doing pretty good. Patient uses oxygen at night. Patient is still currently smoking. Patient has inhalers and nebulizer. Per patient she hasn't had to use the rescue inhaler or nebulizer in a while. Patient stated she had to go to the dentist this week because the inhaler are affecting her teeth. Patient rinses after each inhaler but  stated she has started brushing her teeth after using inhalers. Per patient her O2 sat is 93% during the day and usually 88-90% when she first wakes up. Patient is in the green zone now with no difficulty breathing. Patient thinks she has an Forensic scientist and Healthcare power of attorney but is not sure. Patient agreed for Health Coach to send another. Patient has agreed to outreach calls and referral to pharmacy to assist with medications.   Current Medications:  Current Outpatient Medications  Medication Sig Dispense Refill  . amitriptyline (ELAVIL) 50 MG tablet TAKE 1 TABLET BY MOUTH EVERY DAY. (Patient taking differently: TAKE 1 TABLET BY MOUTH EVERY DAY.--- takes in qhs) 90 tablet 0  . aspirin 81 MG tablet Take 81 mg by mouth every evening.     Marland Kitchen atorvastatin (LIPITOR) 80 MG tablet One tablet daily 90 tablet 3  . Biotin 1000 MCG tablet Take 1,000 mg by mouth daily.    . cilostazol (PLETAL) 100 MG tablet TAKE 1 TABLET (100 MG TOTAL) BY MOUTH 2 (TWO) TIMES DAILY. (Patient taking differently: Take 100 mg by mouth at bedtime. ) 180 tablet 2  . docusate sodium (COLACE) 100 MG capsule Take 100 mg  by mouth at bedtime.    Marland Kitchen ezetimibe (ZETIA) 10 MG tablet Take 1 tablet (10 mg total) by mouth daily. 30 tablet 9  . fluticasone (FLONASE) 50 MCG/ACT nasal spray USE 2 SPRAYS IN BOTH NOSTRILS DAILY. (Patient taking differently: USE 2 SPRAYS IN BOTH NOSTRILS DAILY.--- twice daily) 16 g 2  . Fluticasone-Umeclidin-Vilant (TRELEGY ELLIPTA) 100-62.5-25 MCG/INH AEPB Inhale 1 puff into the lungs daily. (Patient taking differently: Inhale 1 puff into the lungs every morning. ) 1 each 11  . guaiFENesin (MUCINEX) 600 MG 12 hr tablet Take 600 mg by mouth 2 (two) times daily as needed.     Marland Kitchen ipratropium-albuterol (DUONEB) 0.5-2.5 (3) MG/3ML SOLN USE 1 NEB 3 TIMES DAILY FOR 3 DAYS, THEN EVERY 6 HOURS AS NEEDED. 360 mL 0  . IRON PO Take 85 mg by mouth daily.    Marland Kitchen levothyroxine (SYNTHROID, LEVOTHROID) 112 MCG tablet TAKE 1 TABLET (112 MCG TOTAL) BY MOUTH DAILY BEFORE BREAKFAST. 90 tablet 1  . LORazepam (ATIVAN) 2 MG tablet TAKE 1 TABLET AT BEDTIME FOR ANXIETY. 30 tablet 2  . omeprazole (PRILOSEC) 40 MG capsule TAKE 1 CAPSULE (40 MG TOTAL) BY MOUTH 2 (TWO) TIMES DAILY. (Patient taking differently: Take 40 mg by mouth 2 (two) times daily. ) 180 capsule 1  . albuterol (PROVENTIL HFA;VENTOLIN HFA) 108 (90 Base) MCG/ACT inhaler Inhale 2 puffs into the lungs every 4 (four) hours as needed for wheezing. As a RESCUE medication 1 Inhaler 1  . roflumilast (DALIRESP) 500 MCG TABS tablet Take  1 tablet (500 mcg total) by mouth daily. 30 tablet 11  . traMADol (ULTRAM) 50 MG tablet Take 1 tablet (50 mg total) by mouth every 6 (six) hours as needed. (Patient not taking: Reported on 05/16/2018) 10 tablet 0  . traZODone (DESYREL) 50 MG tablet TAKE 1/2-1 TABLET BY MOUTH AT BEDTIME AS NEEDED FOR SLEEP. 30 tablet 2   No current facility-administered medications for this visit.     Functional Status:  In your present state of health, do you have any difficulty performing the following activities: 05/16/2018 03/11/2018  Hearing? [name]   Vision? N N  Difficulty concentrating or making decisions? N N  Walking or climbing stairs? N N  Dressing or bathing? N N  Doing errands, shopping? N -  Preparing Food and eating ? N -  Using the Toilet? N -  In the past six months, have you accidently leaked urine? Y -  Do you have problems with loss of bowel control? N -  Managing your Medications? N -  Managing your Finances? N -  Housekeeping or managing your Housekeeping? N -  Some recent data might be hidden    Fall/Depression Screening: Fall Risk  05/16/2018 08/12/2015  Falls in the past year? No No   PHQ 2/9 Scores 05/16/2018 01/03/2018 08/12/2015  PHQ - 2 Score 0 0 0   THN CM Care Plan Problem One     Most Recent Value  Care Plan Problem One  Knowledge Deficit in self managegement of COPD  Role Documenting the Problem One  [place] for Problem One  Active  THN Long Term Goal   Patient will not have any admissions for COPD exacerbation within the next 90 days  THN Long Term Goal Start Date  05/16/18  Interventions for Problem One Long Term Goal  RN discussed medicataion adherence. RN discussed keep appointments. RN sent educational material on COPD exacerbation. RN will follow up with the next 30 days  THN CM Short Term Goal #1   Patient will start decreasing cigarette smoking to quit within the next 30 days  THN CM Short Term Goal #1 Start Date  05/16/18  Interventions for Short Term Goal #1  Patient and RN discussed patient cigarette smoking and that patient has a pack left. Patient will try to stop smoking before her chemo and radiation treatment start for bladder ca start. RN sent smoking cessation educatuional material in COPD packet  THN CM Short Term Goal #2   Patient will be able to verbalize the zones and action plan of COPD within the next 30 days  THN CM Short Term Goal #2 Start Date  05/16/18  Interventions for Short Term Goal #2  RN discussed the action plan and zone of copd. RN sent a COPD packet  that included a magnet for refrigerator. RN sent educational information COPD exacerbation. RN will follow up with further discussion and teach back  THN CM Short Term Goal #3  Patient will verbalize receiving Advance Directive Packet within the next 30 days  THN CM Short Term Goal #3 Start Date  05/16/18  Interventions for Short Tern Goal #3  RN discussed if patient had an advance directive/ living will. Patient stated she thought so but not sure.  Patient stated ok to send a packet.      Assessment:  Patient will benefit from Health Coach telephonic outreach for education and support for COPD self management.  Plan: RN discussed Advance Directive RN sent Advance  Marine scientist discussed COPD zones and action plan RN sent COPD packet RN will follow up within the month of September Referred to pharmacy RN sent barriers letter and assessment to PCP   [place] Management [phone]

## 2018-05-19 ENCOUNTER — Encounter: Payer: Self-pay | Admitting: Pharmacist

## 2018-05-19 ENCOUNTER — Telehealth: Payer: Self-pay | Admitting: Pharmacist

## 2018-05-19 ENCOUNTER — Telehealth: Payer: Self-pay | Admitting: Family Medicine

## 2018-05-19 NOTE — Patient Outreach (Addendum)
[facility]) Care Management  [place]   05/19/2018  [name] November 16, 1945 [id number]  Subjective: Patient was called regarding medication assistance. HIPAA identifiers were obtained.  Patient is a 72 year old female with multiple medical conditions including but not limited to:  Allergic rhinitis, COPD, PAD, anxiety, CAD, hypertension, fibromyalgia, hypothyroidism, and tobacco use.  Patient manages her medications on her own without issue. However, she wondered why amitriptyline is costing her $45 for a 90 day supply.  Objective:   Encounter Medications: Outpatient Encounter Medications as of 05/19/2018  Medication Sig Note  . albuterol (PROVENTIL HFA;VENTOLIN HFA) 108 (90 Base) MCG/ACT inhaler Inhale 2 puffs into the lungs every 4 (four) hours as needed for wheezing. As a RESCUE medication   . amitriptyline (ELAVIL) 50 MG tablet TAKE 1 TABLET BY MOUTH EVERY DAY. (Patient taking differently: TAKE 1 TABLET BY MOUTH EVERY DAY.--- takes in qhs)   . aspirin 81 MG tablet Take 81 mg by mouth every evening.    Marland Kitchen atorvastatin (LIPITOR) 80 MG tablet One tablet daily   . cilostazol (PLETAL) 100 MG tablet TAKE 1 TABLET (100 MG TOTAL) BY MOUTH 2 (TWO) TIMES DAILY. (Patient taking differently: Take 100 mg by mouth at bedtime. )   . docusate sodium (COLACE) 100 MG capsule Take 100 mg by mouth at bedtime.   Marland Kitchen ezetimibe (ZETIA) 10 MG tablet Take 1 tablet (10 mg total) by mouth daily.   . fluticasone (FLONASE) 50 MCG/ACT nasal spray USE 2 SPRAYS IN BOTH NOSTRILS DAILY. (Patient taking differently: USE 2 SPRAYS IN BOTH NOSTRILS DAILY.--- twice daily)   . Fluticasone-Umeclidin-Vilant (TRELEGY ELLIPTA) 100-62.5-25 MCG/INH AEPB Inhale 1 puff into the lungs daily. (Patient taking differently: Inhale 1 puff into the lungs every morning. )   . guaiFENesin (MUCINEX) 600 MG 12 hr tablet Take 600 mg by mouth 2 (two) times daily as needed.    Marland Kitchen ipratropium-albuterol (DUONEB) 0.5-2.5 (3)  MG/3ML SOLN USE 1 NEB 3 TIMES DAILY FOR 3 DAYS, THEN EVERY 6 HOURS AS NEEDED.   Marland Kitchen IRON PO Take 325 mg by mouth daily.    Marland Kitchen levothyroxine (SYNTHROID, LEVOTHROID) 112 MCG tablet TAKE 1 TABLET (112 MCG TOTAL) BY MOUTH DAILY BEFORE BREAKFAST.   Marland Kitchen LORazepam (ATIVAN) 2 MG tablet TAKE 1 TABLET AT BEDTIME FOR ANXIETY. (Patient taking differently: 1/2 tablet at bedtime)   . omeprazole (PRILOSEC) 40 MG capsule TAKE 1 CAPSULE (40 MG TOTAL) BY MOUTH 2 (TWO) TIMES DAILY. (Patient taking differently: Take 40 mg by mouth 2 (two) times daily. )   . traZODone (DESYREL) 50 MG tablet TAKE 1/2-1 TABLET BY MOUTH AT BEDTIME AS NEEDED FOR SLEEP.   Marland Kitchen roflumilast (DALIRESP) 500 MCG TABS tablet Take 1 tablet (500 mcg total) by mouth daily. (Patient not taking: Reported on 05/19/2018) 03/11/2018: Pt is not taking due to cost    . [DISCONTINUED] Biotin 1000 MCG tablet Take 1,000 mg by mouth daily.   . [DISCONTINUED] traMADol (ULTRAM) 50 MG tablet Take 1 tablet (50 mg total) by mouth every 6 (six) hours as needed. (Patient not taking: Reported on 05/16/2018)    No facility-administered encounter medications on file as of 05/19/2018.     Functional Status: In your present state of health, do you have any difficulty performing the following activities: 05/16/2018 03/11/2018  Hearing? [name]  Vision? N N  Difficulty concentrating or making decisions? N N  Walking or climbing stairs? N N  Dressing or bathing? N N  Doing errands,  shopping? N -  Preparing Food and eating ? N -  Using the Toilet? N -  In the past six months, have you accidently leaked urine? Y -  Do you have problems with loss of bowel control? N -  Managing your Medications? N -  Managing your Finances? N -  Housekeeping or managing your Housekeeping? N -  Some recent data might be hidden    Fall/Depression Screening: Fall Risk  05/16/2018 08/12/2015  Falls in the past year? No No   PHQ 2/9 Scores 05/16/2018 01/03/2018 08/12/2015  PHQ - 2 Score 0 0 0     Assessment: Patient's medications were reviewed via telephone.   Drugs sorted by system:  Neurologic/Psychologic: Amitriptyline, Trazodone, Lorazepam,   Cardiovascular: Aspirin, Atorvastatin, Cilostazol, Ezetimibe,   Pulmonary/Allergy: Fluticasone, Trelegy, Guaifenesin, Ipratropium/albuterol, Daliresp (not taking due to cost)  Gastrointestinal: Omeprazole, Docusate   Endocrine: Levothyroxine  Vitamins/Minerals: Iron  Medication Review Findings:  Duplications in therapy:  Trazodone/Amitriptyline-note will be sent to the patient's provider to see if she needs both medications as they are both being used for sleep.   Medications to avoid in the elderly:  Amitriptyline, lorazepam,   Drug interactions:  Amitriptyline/trazodone/lorazepam-combination can cause drowsiness, dizziness, mental confusion, and increase risk of falls   Medication Assistance Findings: -Patient has [place] -According to [facility], amitriptyline is a tier 4 medication which is why the patient had to pay $45 for a 90 day supply of it.   -She is in the initial coverage phase. TROOP $425.90 Total Drug Spend [phone]  -Alternatives (according to [facility]) include: sertraline, citalopram, escitalopram and bupropion.    -The alternatives are all antidepressants, patient is taking Amitriptyline for insomnia.  -Trazodone was recently added for insomnia as well and is a tier 1 medication.  Patient said trazodone is helping her sleep and has allowed her to decrease her lorazepam dose. If deemed therapeutically appropriate, it may be prudent to discontinue amitriptyline and increase the dose of trazodone.  Plan/Interventions: -called and left a message for Dr. Martinique about the necessity of both Trazodone and Amitriptyline  -Will route note to Dr. Martinique  -Follow up with the patient after hearing from Dr. Martinique.    -Look into patient assistance for Trelegy  since  patient is getting ready to go into the coverage gap. (will look into Daliresp also)   [name], PharmD, [place] Clinical Pharmacist [phone]

## 2018-05-19 NOTE — Telephone Encounter (Signed)
Message sent to Dr. Jordan for review. 

## 2018-05-19 NOTE — Telephone Encounter (Signed)
Copied from [place] [phone]. Topic: General - Other >> May 19, 2018  1:55 PM Margot Ables wrote: Reason for CRM: [facility] has referral for pt - amitriptyline is a Tier 4 for pt and costs her $50/month. Pt is also on traZODone. Is is necessary for pt to be on both medications? Please advise.

## 2018-05-21 ENCOUNTER — Ambulatory Visit: Payer: Self-pay | Admitting: Pharmacist

## 2018-05-21 NOTE — Telephone Encounter (Signed)
So we need to consider weaning off amitriptyline.   She can start taking half tablet at night for 2 weeks, then every other day for a week, then every third day for 3 days, and q 4th day for 2 days and discontinue.  We can titrate trazodone up if needed for insomnia. Thanks, BJ

## 2018-05-21 NOTE — Telephone Encounter (Signed)
Spoke with patient, gave instructions. Patient verbalized understanding. Patient wanted me to let Dr. Martinique know that she was dx with Cancer after seeing specialist about her bladder.

## 2018-05-22 DIAGNOSIS — J449 Chronic obstructive pulmonary disease, unspecified: Secondary | ICD-10-CM | POA: Diagnosis not present

## 2018-05-23 ENCOUNTER — Other Ambulatory Visit: Payer: Self-pay | Admitting: Pharmacist

## 2018-05-23 ENCOUNTER — Ambulatory Visit: Payer: Self-pay | Admitting: Pharmacist

## 2018-05-23 NOTE — Patient Outreach (Signed)
[facility]) Care Management  05/23/2018  IVANKA KIRSHNER 1945-11-26 [id number]   Patient was called to follow up on medication assistance/management.  Unfortunately, she did not answer the phone.  HIPAA compliant message was left on patient's voicemail.   Plan: Call patient back in 3-5 business days. Unsuccessful contact letter mailed.   Elayne Guerin, PharmD, [place] Clinical Pharmacist [phone]

## 2018-05-28 ENCOUNTER — Ambulatory Visit: Payer: Self-pay | Admitting: Pharmacist

## 2018-05-28 ENCOUNTER — Encounter: Payer: Self-pay | Admitting: Pharmacist

## 2018-05-28 ENCOUNTER — Other Ambulatory Visit: Payer: Self-pay | Admitting: Pharmacist

## 2018-05-28 NOTE — Patient Outreach (Signed)
[facility]) Care Management  05/28/2018  [name] [birth year] [id number]   Patient was called to follow up on medication assistance/management.  Unfortunately, she did not answer the phone.  HIPAA compliant message was left on patient's voicemail. Today's call was call #2.   Plan: Call patient back in 5-7 business days. Unsuccessful contact letter mailed 05/23/18.   Elayne Guerin, PharmD, [place] Clinical Pharmacist [phone]

## 2018-05-29 ENCOUNTER — Other Ambulatory Visit: Payer: Self-pay | Admitting: Pharmacist

## 2018-05-29 NOTE — Patient Outreach (Signed)
[facility]) Care Management  05/29/2018  [name] 09-Nov-1945 [id number]   Patient returned my call from yesterday. HIPAA identifiers were obtained.  Patient confirmed that she received Dr.Jordan's message to titrate off amitriptyline as was suggested. She will remain on Trazodone for insomnia and Dr. Martinique will titrate the dose as needed.    Plan: Close patient's pharmacy case as her needs have been met.  I will alert [place], RN as she is still involved in the patient's care.   Elayne Guerin, PharmD, [place] Clinical Pharmacist [phone]

## 2018-06-04 ENCOUNTER — Other Ambulatory Visit: Payer: Medicare Other | Admitting: *Deleted

## 2018-06-04 DIAGNOSIS — E7800 Pure hypercholesterolemia, unspecified: Secondary | ICD-10-CM

## 2018-06-04 DIAGNOSIS — E875 Hyperkalemia: Secondary | ICD-10-CM | POA: Diagnosis not present

## 2018-06-04 LAB — HEPATIC FUNCTION PANEL
ALT: 23 IU/L (ref 0–32)
AST: 22 IU/L (ref 0–40)
Albumin: 4.6 g/dL (ref 3.5–4.8)
Alkaline Phosphatase: 87 IU/L (ref 39–117)
Bilirubin Total: 0.6 mg/dL (ref 0.0–1.2)
Bilirubin, Direct: 0.19 mg/dL (ref 0.00–0.40)
Total Protein: 7.6 g/dL (ref 6.0–8.5)

## 2018-06-04 LAB — LIPID PANEL
Chol/HDL Ratio: 1.8 ratio (ref 0.0–4.4)
Cholesterol, Total: 147 mg/dL (ref 100–199)
HDL: 83 mg/dL (ref >39)
LDL Calculated: 49 mg/dL (ref 0–99)
Triglycerides: 73 mg/dL (ref 0–149)
VLDL Cholesterol Cal: 15 mg/dL (ref 5–40)

## 2018-06-09 ENCOUNTER — Ambulatory Visit: Payer: Self-pay | Admitting: Pharmacist

## 2018-06-11 DIAGNOSIS — C672 Malignant neoplasm of lateral wall of bladder: Secondary | ICD-10-CM | POA: Diagnosis not present

## 2018-06-16 ENCOUNTER — Telehealth: Payer: Self-pay | Admitting: Family Medicine

## 2018-06-17 ENCOUNTER — Other Ambulatory Visit: Payer: Self-pay | Admitting: *Deleted

## 2018-06-17 MED ORDER — AMITRIPTYLINE HCL 50 MG PO TABS: ORAL_TABLET | ORAL | 0 refills | Status: DC

## 2018-06-17 NOTE — Telephone Encounter (Signed)
Patient is calling about medication Amitriptyline HCl 50 MG being refused. She states that she was instructed to cut the tablet in half and only take a half. She said she is unable to do that so therefore she has been taking a whole and that is the reason she is out.

## 2018-06-17 NOTE — Patient Outreach (Signed)
[name]) Care Management  06/17/2018   [name] [birth year] [id number]  RN Health Coach telephone call to patient.  Hipaa compliance verified. Per patient she is feeling the best she has in a long time. Patient has a slight non productive cough. Per patient she is still smoking and will speak with Dr on 0909 about starting chantix. Per patient she had stopped using her inhalers since going to the Dentist because of staining her teeth so bad. Rn discussed with patient about oral care. RN discussed with patient about using her medication as per ordered. RN discussed with patient about preventing COPD exacerbation. Patient has agreed to further outreach calls  Current Medications:  Current Outpatient Medications  Medication Sig Dispense Refill  . albuterol (PROVENTIL HFA;VENTOLIN HFA) 108 (90 Base) MCG/ACT inhaler Inhale 2 puffs into the lungs every 4 (four) hours as needed for wheezing. As a RESCUE medication 1 Inhaler 1  . amitriptyline (ELAVIL) 50 MG tablet TAKE 1 TABLET BY MOUTH EVERY DAY.--- takes in qhs 90 tablet 0  . aspirin 81 MG tablet Take 81 mg by mouth every evening.     Marland Kitchen atorvastatin (LIPITOR) 80 MG tablet One tablet daily 90 tablet 3  . cilostazol (PLETAL) 100 MG tablet TAKE 1 TABLET (100 MG TOTAL) BY MOUTH 2 (TWO) TIMES DAILY. (Patient taking differently: Take 100 mg by mouth at bedtime. ) 180 tablet 2  . docusate sodium (COLACE) 100 MG capsule Take 100 mg by mouth at bedtime.    Marland Kitchen ezetimibe (ZETIA) 10 MG tablet Take 1 tablet (10 mg total) by mouth daily. 30 tablet 9  . fluticasone (FLONASE) 50 MCG/ACT nasal spray USE 2 SPRAYS IN BOTH NOSTRILS DAILY. (Patient taking differently: USE 2 SPRAYS IN BOTH NOSTRILS DAILY.--- twice daily) 16 g 2  . Fluticasone-Umeclidin-Vilant (TRELEGY ELLIPTA) 100-62.5-25 MCG/INH AEPB Inhale 1 puff into the lungs daily. (Patient taking differently: Inhale 1 puff into the lungs every morning. ) 1 each 11  . guaiFENesin (MUCINEX) 600  MG 12 hr tablet Take 600 mg by mouth 2 (two) times daily as needed.     Marland Kitchen ipratropium-albuterol (DUONEB) 0.5-2.5 (3) MG/3ML SOLN USE 1 NEB 3 TIMES DAILY FOR 3 DAYS, THEN EVERY 6 HOURS AS NEEDED. 360 mL 0  . IRON PO Take 325 mg by mouth daily.     Marland Kitchen levothyroxine (SYNTHROID, LEVOTHROID) 112 MCG tablet TAKE 1 TABLET (112 MCG TOTAL) BY MOUTH DAILY BEFORE BREAKFAST. 90 tablet 1  . LORazepam (ATIVAN) 2 MG tablet TAKE 1 TABLET AT BEDTIME FOR ANXIETY. (Patient taking differently: 1/2 tablet at bedtime) 30 tablet 2  . omeprazole (PRILOSEC) 40 MG capsule Take 1 capsule (40 mg total) by mouth 2 (two) times daily. 180 capsule 2  . roflumilast (DALIRESP) 500 MCG TABS tablet Take 1 tablet (500 mcg total) by mouth daily. 30 tablet 11  . traZODone (DESYREL) 50 MG tablet TAKE 1/2-1 TABLET BY MOUTH AT BEDTIME AS NEEDED FOR SLEEP. 30 tablet 2   No current facility-administered medications for this visit.     Functional Status:  In your present state of health, do you have any difficulty performing the following activities: 05/16/2018 03/11/2018  Hearing? [name]  Vision? N N  Difficulty concentrating or making decisions? N N  Walking or climbing stairs? N N  Dressing or bathing? N N  Doing errands, shopping? N -  Preparing Food and eating ? N -  Using the Toilet? N -  In the past six months, have you  accidently leaked urine? Y -  Do you have problems with loss of bowel control? N -  Managing your Medications? N -  Managing your Finances? N -  Housekeeping or managing your Housekeeping? N -  Some recent data might be hidden    Fall/Depression Screening: Fall Risk  05/16/2018 08/12/2015  Falls in the past year? No No   PHQ 2/9 Scores 05/16/2018 01/03/2018 08/12/2015  PHQ - 2 Score 0 0 0   THN CM Care Plan Problem One     Most Recent Value  Care Plan Problem One  Knowledge Deficit in self managegement of COPD  Role Documenting the Problem One  [place] for Problem One  Active  THN Long Term  Goal   Patient will not have any admissions for COPD exacerbation within the next 90 days  Interventions for Problem One Long Term Goal  RN reiterasted taking medications as prescribed. RN discussed stop smoking . RN discussed keeping Dr appointments. RN willfollow up with further discussion  THN CM Short Term Goal #1   Patient will start decreasing cigarette smoking to quit within the next 30 days  Interventions for Short Term Goal #1  RN and patient discussed patient talking with Dr Martinique on Mon to start chantix to help stop smoking. RN will follow up with compliance  THN CM Short Term Goal #2   Patient will be able to verbalize the zones and action plan of COPD within the next 30 days  Interventions for Short Term Goal #2  Patient is able to verbalize zones but has not been using inhalers a prescribed. RN had further discussion on medication adherence and follow up with further discussion  THN CM Short Term Goal #3  Patient will verbalize receiving Advance Directive Packet within the next 30 days      Assessment:  Patient will discuss with Dr Martinique about Chantix to stop smoking Patient has a cough Patient  in green zone Patient has not used inhalers as per ordered Patient has not had any COPD exacerbations  Plan:  Patient will discuss taking Chantix on [phone] with Dr Martinique RN discussed medication adherence RN discussed oral care RN discussed getting flu shot RN sent Form COPD Plan RN Smoking Tobacco information, Adult RN sent Steps to quit smoking RN will follow up within the month of November  [name] [place] Management [phone]

## 2018-06-17 NOTE — Telephone Encounter (Signed)
Spoke with patient and she stated that she was unable to wean herself off medication as discussed previously with PCP. Patient stated that she still need medication for sleep purposes. Patient scheduled OV to discuss with PCP on 06/23/18. Rx sent to pharmacy.

## 2018-06-20 ENCOUNTER — Encounter: Payer: Self-pay | Admitting: Internal Medicine

## 2018-06-20 ENCOUNTER — Ambulatory Visit: Payer: Medicare Other | Admitting: Internal Medicine

## 2018-06-20 ENCOUNTER — Other Ambulatory Visit: Payer: Medicare Other

## 2018-06-20 VITALS — BP 118/70 | HR 84 | Ht 65.0 in | Wt 140.0 lb

## 2018-06-20 DIAGNOSIS — IMO0001 Reserved for inherently not codable concepts without codable children: Secondary | ICD-10-CM

## 2018-06-20 DIAGNOSIS — Z23 Encounter for immunization: Secondary | ICD-10-CM | POA: Diagnosis not present

## 2018-06-20 DIAGNOSIS — F17200 Nicotine dependence, unspecified, uncomplicated: Secondary | ICD-10-CM

## 2018-06-20 DIAGNOSIS — J449 Chronic obstructive pulmonary disease, unspecified: Secondary | ICD-10-CM

## 2018-06-20 MED ORDER — VARENICLINE TARTRATE 0.5 MG X 11 & 1 MG X 42 PO MISC: ORAL | 0 refills | Status: DC

## 2018-06-20 NOTE — Patient Instructions (Addendum)
Stage 3 severe COPD by GOLD classification ([place])  - stable disease - high dose flu shot 06/20/2018 - continue trelegy, mucinex scheduled with duoneb as needed - if you were intolerant to daliresp in past - please let CMA know -   - take it off med list ,    - add to allergy list  Smoking #SMoking - too bad problem deteriorated/relapsed  - glad you want to quit   -  start chantix as directed  - Days 1-3: 0.5 mg once daily  - Days 4-7: 0.5 mg twice daily  - Maintenance (? Day 8):  1 mg twice daily for 11 weeks   - if you have bad dream stop medication and call us  - take the medicaiton as instructed and after food - quit smoking  1 week after starting chantix     Followup 6 months or sooner  If needed: CAT score at followup

## 2018-06-20 NOTE — Progress Notes (Signed)
PCP Christy Koch, MD  OV 12/31/2016  Chief Complaint  Patient presents with  . Follow-up    Pt states her breahting is doing well. Pt denies cough and CP/tightness. Pt c/o acid reflux.      Follow-up Gold stage III COPD  She has recurrent exacerbation. Most recently was over a month ago when she was hospitalized. She currently feels baseline. She feels good. She is not on oxygen therapy. Reviewed her medication list and shows that she is only on Advair as control therapy for COPD. She has no idea why she is not on an anticholinergic. DuoNeb was listed but she says she only takes it sparingly as needed and since her discharge is not needing it. She has normal work and anticholinergic is. She also tells me that because of her eosinophilia and elevated IgE and seasonal allergies wants a referral to allergy clinic. She is seeing Dr. Allyson [name] for her skin issues and she is feeling better.         Results for [name], [name] (MRN [id number]) as of 12/31/2016 12:33  Ref. Range 07/04/2016 05:26 07/05/2016 03:23 07/10/2016 05:13 11/16/2016 13:20 11/17/2016 05:57  Eosinophils Absolute Latest Ref Range: 0.0 - 0.7 K/uL    1.6 (H) 0.0    Results for [name], [name] (MRN [id number]) as of 12/31/2016 12:33  Ref. Range 11/16/2016 13:20  IgE (Immunoglobulin E), Serum Latest Ref Range: 0 - 100 IU/mL 180 (H)    OV 02/26/2017  Chief Complaint  Patient presents with  . Follow-up    FOLLOW UP FOR patient states that she is here for SOB and her voice is horse and she has wheezing all the time patient denies c/p or tightness     Follow-up Gold stage III COPD: She is on triple inhaler therapy with Spiriva and Advair. Has COPD stable. She has elevated IgE and eosinophilia. She has seen allergist and then got referred to [name] ENT who apparently treated her for pharyngeal or laryngeal thrush and this has helped. She also has skin rash. I referred her to Dr. Allyson [name] and she's tried some prednisone course and  has had skin biopsy with these things have not helped. Nevertheless she feels she is under good care. Overall in terms of COPD she stable but she does have significant cough and is a history of frequent exacerbations. But she does not think she is an exacerbation today  The new issue appears to be that she has last few days she has right lower extremity swelling greater than the left. There is some associated warmth    03/07/2017 Follow UP OV for Chronic DVT:  Pt. Was seen by [name] 02/26/2017 for leg swelling. He was concerned about possibility of acute DVT and ordered a doppler study. The patient did not have any chest pain or shortness of breath at the time. 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 [name] chose not to treat as  this is a chronic problem. He wanted the patient to be diligent with her ASA daily.The patient returns today for follow up. She continues to have no respiratory issues. Her COPD is stable and at baseline. She does  not have any chest pain, or tachycardia. She states her baseline dyspnea that she experiences with her COPD  is unchanged. She continues to have issues with bilateral leg swelling.She is not wearing her oxygen with exertion as prescribed . She states she is wearing it at night.  We did discuss the additional strain  placed on the heart when oxygen levels fall below 88%, and the importance of compliance with oxygen on exertion. Most recent hospital admission for COPD exacerbation 11/2016. She was prophylaxed with heparin for DVT prophylaxis at that time.    OV 06/04/2017  Chief Complaint  Patient presents with  . Follow-up    Pt states her SOB has recently worsened x 1 week. Pt c/o prod cough with opaque colored mucus. Pt denies CP/tightness and f/c/s.    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 Here with her husband. She initially told  me that she was stable and doing well without exacerbations but has a continued to talk to her she initially told me that she's been coughing more than usual for past one day but then has been interjected saying she's been coughing more than usual for a few weeks. She agreed to this. She admitted that the sputum is a little bit more discolored than baseline. She is also more short of breath and wheezing more and coughing more than baseline.  Her recent months have been complicated by chronic DVT finding for which she says she is seeing vascular surgery [name] and his been placed on observation therapy according to history. She's also had cellulitis issues with her feet. She showed me treatment medication of clindamycin and cephalexin at [facility] and was to hospital in the middle of June 2018. She says for the last few months she's been having excessive hair loss and she's going to see dermatology for this. She's wondering if antibiotic and prednisone and contributing to hair loss. At the same time she is worried about recurrent COPD exacerbation and leaving it untreated.      07/15/2017 Acute OV:  [name] is a 72 y.o. female former smoker with COPD Gold III and history of breast cancer.She is followed by [name].  Pt. Presents for acute visit with a 2 week history of worsening shortness of breath, cough, increased wheezing. She endorses tightness in her chest. She is coughing up clear foamy white secretions. No significant increase in amount of secretions.She states she has not had a fever. She is compliant with her Advair and Spiriva daily. She prefers Trelegy, but cannot afford it. She is compliant with her Duonebs,  Mucinex, Flonase and has had increased use in frequency of need for rescue inhaler. She is currently on oxygen nocturnally. She has been checking her saturations, and has found that her saturations during the day have dropped as low as the 70's. She was at 86%  today. We have placed her on oxygen at 2 L . She has rebounded to 92%.She denies fever, chest pain, or hemoptysis. She states she has been sleeping propped on 2 pillows for months.  Test Results:   OV 09/09/2017  Chief Complaint  Patient presents with  . Follow-up    Pt states that she has been doing good. States that she started coughing with thick mucus x1 week ago. Also states that she has SOB with exertion. Denies any CP.    Gold stage III COPD with a history of breast cancer.  She has frequent exacerbations.  Last August 2018.  Then after that in October 2018 again had an exacerbation and so my nurse practitioner.  She tells me that she is taking different inhalers because of cost issues although her best  preferred inhalers trelegy..  Since that she has quit smoking.  She is very worried about her hair loss and has an appoint with Dr. Allyson [name].  She says that she saw a dermatologist at [facility] and was recommended being in a tanning bed for 30 days but this is not helped.  She is looking forward to seeing Dr. Allyson [name].  Currently feels COPD stable with a COPD CAT score of 17 but she does have mild for secretory wheeze.  She is extremely interested in [place] for prevention of COPD exacerbations.  OV 12/20/2017  Chief Complaint  Patient presents with  . Follow-up    copd no SOB, no wheezing, no chest tightness   Severe copd = MM Phenotype  Gold stage III COPD with history of breast cancer.  She has frequent exacerbations.  Last seen Thanksgiving 2018.  At that time I started her on Daliresp.  Since then she is only had one exacerbation.  I thought this was all because of Daliresp that her exacerbation frequency is reduced but today she tells me that she did take the first set of the drug and she liked it and it helped but then it was expensive and she has not really taken the medication.  She did see Dr. Melvyn Novas my colleague in the interim and was given prednisone for another exacerbation.   COPD CAT score shows continued stability.  Overall there are no new problems.   Results for [name], [name] (MRN [id number]) as of 12/20/2017 13:00  Ref. Range 08/16/2016 14:35 10/26/2016 07:04  FEV1-Post Latest Units: L 1.37 1.03  FEV1-%Pred-Post Latest Units: % 56 44        OV 06/20/2018  Subjective:  Patient ID: [name], female , DOB: 11-Jan-1946 , age 72 y.o. , MRN: [id number] , ADDRESS: [address] [id number]   06/20/2018 -   Chief Complaint  Patient presents with  . Follow-up    Pt states she has been doing well since last visit and denies any complaints.     HPI HAYDYN LIDDELL 72 y.o. - presents for follow-up of stage III Gold COPD. COPD cat score is 17 and same as baseline.she is oncology inhaler. With this her cough is improved but the phlegm is the same and chest tightness is the same. Issues dyspnea on the fatigue is improved.there are no other new issues  Smoking: This is relapsed. She wants to take Chantix. She does have firearms in the house but she has never used them. There are no suicidal or homicidal tendencies. She has used Chantix in the past with good success so she wants this medicine back.  Past medical history: In the interim is been diagnosed with bladder cancer and is on treatment      CAT COPD Symptom & Quality of Life Score ([place] trademark) 0 is no burden. 5 is highest burden 09/09/2017  06/20/2018   Never Cough -> Cough all the time 3 0  No phlegm in chest -> Chest is full of phlegm 3 4  No chest tightness -> Chest feels very tight 3 3  No dyspnea for 1 flight stairs/hill -> Very dyspneic for 1 flight of stairs 0 4  No limitations for ADL at home -> Very limited with ADL at home 0 1  Confident leaving home -> Not at all confident leaving home 0 0  Sleep soundly -> Do not sleep soundly because of lung condition 5 5  Lots of Energy -> No energy  at all 3 0  TOTAL Score (max 40)  17 17    ROS - per HPI     has a past medical  history of Bilateral leg cramps, Bladder neoplasm, Chronic deep vein thrombosis (DVT) of right lower extremity ([place]), COPD, frequent exacerbations ([place]), Coronary artery disease, Dyspnea on minimal exertion, Emphysema/COPD ([place]), Fibromyalgia (1995), GERD (gastroesophageal reflux disease), Hiatal hernia, History of diverticulitis of colon, History of multiple pulmonary nodules, History of right breast cancer (oncologist-  dr Jana Hakim-- no recurrence), colonic polyps, Hyperlipidemia, Hypertension, Hypothyroidism, 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   reports that she has been smoking cigarettes. She has a 37.50 pack-year smoking history. She has never used smokeless tobacco.  Past Surgical History:  Procedure Laterality Date  . BREAST EXCISIONAL BIOPSY Left 10/15/2008  . BREAST LUMPECTOMY W/ NEEDLE LOCALIZATION Right 11-09-2008    dr Brantley Stage   [facility]  . CARDIAC CATHETERIZATION  03-12-2011   dr Aundra Dubin   70-80% ostial stenosis in small second diagonal (appears to small for intervention),  dLAD 40-50%,  minimal luminal irregulartieis involoving LCFx and RCA,  LVEF 55%  . CATARACT EXTRACTION W/ INTRAOCULAR LENS  IMPLANT, BILATERAL  2011  . CYSTOSCOPY N/A 03/11/2018   Procedure: CYSTOSCOPY WITH INSTILLATION OF POST OPERATIVE EPIRUBICIN;  Surgeon: Festus Aloe, MD;  Location: [facility];  Service: Urology;  Laterality: N/A;  . MASTECTOMY Right   . PARTIAL COLECTOMY  2002   sigmoid and appectomy (diverticulitis)  . PARTIAL MASTECTOMY WITH NEEDLE LOCALIZATION Left 02/23/2013   Procedure:  LEFT PARTIAL MASTECTOMY WITH NEEDLE LOCALIZATION;   Surgeon: Adin Hector, MD;  Location: [place];  Service: General;  Laterality: Left;  . RECONSTRUCTION BREAST W/ LATISSIMUS DORSI FLAP Right 05-30-2009   dr Harlow Mares  [facility]  . REDUCTION MAMMAPLASTY Left   . SIMPLE MASTECTOMY Right 12-28-2008    dr Brantley Stage  [facility]  . TRANSTHORACIC ECHOCARDIOGRAM  07-09-2016  dr Aundra Dubin   ef 55-60%, grade 1 diastoic dysfunction/  mild TR  . TRANSURETHRAL RESECTION OF BLADDER TUMOR N/A 03/11/2018   Procedure: TRANSURETHRAL RESECTION OF BLADDER TUMOR (TURBT) 2-5cm;  Surgeon: Festus Aloe, MD;  Location: [facility];  Service: Urology;  Laterality: N/A;  . VAGINAL HYSTERECTOMY  1973  . VIDEO ASSISTED THORACOSCOPY (VATS)/WEDGE RESECTION Right 09-11-2005  &  06-14-2011   dr hendrickso  [facility]   both RUL    Allergies  Allergen Reactions  . Sulfa Antibiotics Swelling    Immunization History  Administered Date(s) Administered  . Influenza, High Dose Seasonal PF 10/03/2017  . Influenza,inj,Quad PF,6+ Mos 08/10/2014, 08/12/2015, 07/03/2016  . Pneumococcal Conjugate-13 08/10/2014  . Pneumococcal Polysaccharide-23 08/12/2015  . Td 10/16/2007  . Zoster 11/09/2010    Family History  Problem Relation Age of Onset  . Colon cancer Mother        colon  . COPD Mother        brown lung  . Hypertension Mother   . Diabetes Mother   . Emphysema Mother   . Coronary artery disease Father   . Heart attack Father   . Sudden death Father   . Heart disease Father   . Cancer Sister        fallopian tube  .  Hypertension Sister   . Coronary artery disease Brother   . Throat cancer Sister   . Melanoma Sister   . Hypertension Sister   . Pancreatic cancer Sister   . Cancer Sister   . Heart attack Brother        early 48's  . Heart failure Sister      Current Outpatient Medications:  .  albuterol (PROVENTIL HFA;VENTOLIN HFA) 108 (90 Base) MCG/ACT inhaler, Inhale 2 puffs into the lungs every 4 (four) hours as needed for wheezing. As a RESCUE  medication, Disp: 1 Inhaler, Rfl: 1 .  amitriptyline (ELAVIL) 50 MG tablet, TAKE 1 TABLET BY MOUTH EVERY DAY.--- takes in qhs, Disp: 90 tablet, Rfl: 0 .  aspirin 81 MG tablet, Take 81 mg by mouth every evening. , Disp: , Rfl:  .  atorvastatin (LIPITOR) 80 MG tablet, One tablet daily, Disp: 90 tablet, Rfl: 3 .  cilostazol (PLETAL) 100 MG tablet, TAKE 1 TABLET (100 MG TOTAL) BY MOUTH 2 (TWO) TIMES DAILY. (Patient taking differently: Take 100 mg by mouth at bedtime. ), Disp: 180 tablet, Rfl: 2 .  docusate sodium (COLACE) 100 MG capsule, Take 100 mg by mouth at bedtime., Disp: , Rfl:  .  ezetimibe (ZETIA) 10 MG tablet, Take 1 tablet (10 mg total) by mouth daily., Disp: 30 tablet, Rfl: 9 .  fluticasone (FLONASE) 50 MCG/ACT nasal spray, USE 2 SPRAYS IN BOTH NOSTRILS DAILY. (Patient taking differently: USE 2 SPRAYS IN BOTH NOSTRILS DAILY.--- twice daily), Disp: 16 g, Rfl: 2 .  Fluticasone-Umeclidin-Vilant (TRELEGY ELLIPTA) 100-62.5-25 MCG/INH AEPB, Inhale 1 puff into the lungs daily. (Patient taking differently: Inhale 1 puff into the lungs every morning. ), Disp: 1 each, Rfl: 11 .  guaiFENesin (MUCINEX) 600 MG 12 hr tablet, Take 600 mg by mouth 2 (two) times daily as needed. , Disp: , Rfl:  .  ipratropium-albuterol (DUONEB) 0.5-2.5 (3) MG/3ML SOLN, USE 1 NEB 3 TIMES DAILY FOR 3 DAYS, THEN EVERY 6 HOURS AS NEEDED., Disp: 360 mL, Rfl: 0 .  IRON PO, Take 325 mg by mouth daily. , Disp: , Rfl:  .  levothyroxine (SYNTHROID, LEVOTHROID) 112 MCG tablet, TAKE 1 TABLET (112 MCG TOTAL) BY MOUTH DAILY BEFORE BREAKFAST., Disp: 90 tablet, Rfl: 1 .  LORazepam (ATIVAN) 2 MG tablet, TAKE 1 TABLET AT BEDTIME FOR ANXIETY. (Patient taking differently: 1/2 tablet at bedtime), Disp: 30 tablet, Rfl: 2 .  omeprazole (PRILOSEC) 40 MG capsule, Take 1 capsule (40 mg total) by mouth 2 (two) times daily., Disp: 180 capsule, Rfl: 2 .  roflumilast (DALIRESP) 500 MCG TABS tablet, Take 1 tablet (500 mcg total) by mouth daily., Disp: 30  tablet, Rfl: 11 .  traZODone (DESYREL) 50 MG tablet, TAKE 1/2-1 TABLET BY MOUTH AT BEDTIME AS NEEDED FOR SLEEP., Disp: 30 tablet, Rfl: 2 .  varenicline (CHANTIX PAK) 0.5 MG X 11 & 1 MG X 42 tablet, Take one 0.5 mg tablet by mouth once daily for 3 days, then increase to one 0.5 mg tablet twice daily for 4 days, then increase to one 1 mg tablet twice daily., Disp: 53 tablet, Rfl: 0      Objective:   Vitals:   06/20/18 1118  BP: 118/70  Pulse: 84  SpO2: 95%  Weight: 140 lb (63.5 kg)  Height: 5\' 5"  (1.651 m)    Estimated body mass index is 23.3 kg/m as calculated from the following:   Height as of this encounter: 5\' 5"  (1.651 m).   Weight as of  this encounter: 140 lb (63.5 kg).  @WEIGHTCHANGE @  [employer]   06/20/18 1118  Weight: 140 lb (63.5 kg)     Physical Exam  General Appearance:    Alert, cooperative, no distress, appears stated age - yes , sitting on - chair  Head:    Normocephalic, without obvious abnormality, atraumatic  Eyes:    PERRL, conjunctiva/corneas clear,  Ears:    Normal TM's and external ear canals, both ears  Nose:   Nares normal, septum midline, mucosa normal, no drainage    or sinus tenderness. OXYGEN ON  - no . Patient is @ rooma air   Throat:   Lips, mucosa, and tongue normal; teeth and gums normal. Cyanosis on lips - no  Neck:   Supple, symmetrical, trachea midline, no adenopathy;    thyroid:  no enlargement/tenderness/nodules; no carotid   bruit or JVD  Back:     Symmetric, no curvature, ROM normal, no CVA tenderness  Lungs:     Distress - no , Wheeze no, Barrell Chest - yes, Purse lip breathing - mild yes, Crackles - no   Chest Wall:    No tenderness or deformity. Scars in chest no   Heart:    Regular rate and rhythm, S1 and S2 normal, no murmur, rub   or gallop  Breast Exam:    NOT DONE  Abdomen:     Soft, non-tender, bowel sounds active all four quadrants,    no masses, no organomegaly  Genitalia:   NOT DONE  Rectal:   NOT DONE    Extremities:   Extremities normal, atraumatic, Clubbing - no, Edema - no  Pulses:   2+ and symmetric all extremities  Skin:   Stigmata of Connective Tissue Disease - no  Lymph nodes:   Cervical, supraclavicular, and axillary nodes normal  Psychiatric:  Neurologic:   Mild anxious CNII-XII intact, normal strength, sensation  throughout           Assessment:       ICD-10-CM   1. Stage 3 severe COPD by GOLD classification ([place]) J44.9   2. Smoking F17.200        Plan:     Stage 3 severe COPD by GOLD classification ([place])  - stable disease - high dose flu shot 06/20/2018 - continue trelegy, mucinex scheduled with duoneb as needed - if you were intolerant to daliresp in past - please let CMA know -   - take it off med list ,    - add to allergy list  Smoking #SMoking - too bad problem deteriorated/relapsed  - glad you want to quit   -  start chantix as directed  - Days 1-3: 0.5 mg once daily  - Days 4-7: 0.5 mg twice daily  - Maintenance (? Day 8):  1 mg twice daily for 11 weeks   - if you have bad dream stop medication and call us  - take the medicaiton as instructed and after food - quit smoking  1 week after starting chantix     Followup 6 months or sooner  If needed: CAT score at followu          SIGNATURE    Dr. Brand Males, M.D., F.C.C.P,  Pulmonary and Critical Care Medicine Staff Physician, [place] Director - Interstitial Lung Disease  Program  [facility] [place] at [place], [place], [address]  Pager: [phone], If no answer or between  15:00h - 7:00h: call  [place] Telephone: [phone]  11:55 AM 06/20/2018

## 2018-06-21 ENCOUNTER — Other Ambulatory Visit: Payer: Self-pay | Admitting: Family Medicine

## 2018-06-21 DIAGNOSIS — G4700 Insomnia, unspecified: Secondary | ICD-10-CM

## 2018-06-22 DIAGNOSIS — J449 Chronic obstructive pulmonary disease, unspecified: Secondary | ICD-10-CM | POA: Diagnosis not present

## 2018-06-23 ENCOUNTER — Ambulatory Visit (INDEPENDENT_AMBULATORY_CARE_PROVIDER_SITE_OTHER): Payer: Medicare Other | Admitting: Family Medicine

## 2018-06-23 ENCOUNTER — Encounter: Payer: Self-pay | Admitting: Family Medicine

## 2018-06-23 VITALS — BP 120/68 | HR 86 | Temp 97.9°F | Resp 16 | Ht 65.0 in | Wt 138.5 lb

## 2018-06-23 DIAGNOSIS — Z720 Tobacco use: Secondary | ICD-10-CM

## 2018-06-23 DIAGNOSIS — I10 Essential (primary) hypertension: Secondary | ICD-10-CM | POA: Diagnosis not present

## 2018-06-23 DIAGNOSIS — F419 Anxiety disorder, unspecified: Secondary | ICD-10-CM

## 2018-06-23 DIAGNOSIS — G4700 Insomnia, unspecified: Secondary | ICD-10-CM | POA: Diagnosis not present

## 2018-06-23 DIAGNOSIS — K219 Gastro-esophageal reflux disease without esophagitis: Secondary | ICD-10-CM

## 2018-06-23 MED ORDER — PANTOPRAZOLE SODIUM 40 MG PO TBEC: 40.0000 mg | DELAYED_RELEASE_TABLET | Freq: Every day | ORAL | 3 refills | Status: DC

## 2018-06-23 MED ORDER — RANITIDINE HCL 150 MG PO TABS: 150.0000 mg | ORAL_TABLET | Freq: Two times a day (BID) | ORAL | 3 refills | Status: DC

## 2018-06-23 NOTE — Assessment & Plan Note (Signed)
Stable. Continue trazodone 50 mg and Xanax 1 mg at bedtime. We discussed some side effects of medications as well as the risk of interaction between some. Follow-up in 6 months, before if needed.

## 2018-06-23 NOTE — Patient Instructions (Addendum)
A few things to remember from today's visit:   Insomnia, unspecified type  Essential hypertension  Anxiety disorder, unspecified type  Gastroesophageal reflux disease, esophagitis presence not specified - Plan: pantoprazole (PROTONIX) 40 MG tablet, ranitidine (ZANTAC) 150 MG tablet  Stop omeprazole. Start Protonix 30 minutes before breakfast and Zantac twice daily. Avoid foods that can aggravate your acid reflux. Strongly recommend starting Chantix for smoking cessation. Please let me know in about 2 months or you do with the new medication for acid reflux.  Please be sure medication list is accurate. If a new problem present, please set up appointment sooner than planned today.

## 2018-06-23 NOTE — Progress Notes (Signed)
HPI:   [name] is a 72 y.o. female, who is here today for 6 months follow up.   She was last seen on 01/03/18.  Since her last OV she has had bladder cancer diagnosed and treated surgically +1 round of chemo.  She does follow with urologist and according to patient, no more lesions found in bladder last visit.  Insomnia: Currently she is on amitriptyline 50 mg and trazodone 50 mg. She tried to wean amitriptyline but insomnia got worse. She has been taking amitriptyline for several years. She is sleeping well with both medications.  Anxiety: She is now taking Xanax 2 mg half tablet at bedtime. She denies depressed mood or suicidal thoughts.  GERD: Currently she is on omeprazole 40 mg twice daily. Still having heartburn, worse at night. She ran out of omeprazole 2 days ago, symptoms are worse.  Having nausea and acidic vomiting. No changes in bowel habits, melena, or abdominal pain.    Hypertension: Currently she is on nonpharmacologic treatment. She follows with cardiologist, CAD. She is no longer having chest pain or palpitations.   She resumed tobacco use. According to patient, her pulmonologist prescribed Chantix but she is afraid of possible side effects She tried Chantix a few years ago and it did help. She does not remember side effects. She has history of COPD, CAD, and PAD.   Review of Systems  Constitutional: Positive for fatigue. Negative for activity change, appetite change and fever.  HENT: Negative for mouth sores, nosebleeds and trouble swallowing.   Eyes: Negative for redness and visual disturbance.  Respiratory: Positive for shortness of breath. Negative for cough and wheezing.   Cardiovascular: Negative for chest pain, palpitations and leg swelling.  Gastrointestinal: Negative for abdominal pain, nausea and vomiting.       Negative for changes in bowel habits.  Genitourinary: Negative for decreased urine volume, dysuria and hematuria.    Neurological: Negative for syncope, weakness and headaches.  Psychiatric/Behavioral: Positive for sleep disturbance. The patient is nervous/anxious.     Current Outpatient Medications on File Prior to Visit  Medication Sig Dispense Refill  . albuterol (PROVENTIL HFA;VENTOLIN HFA) 108 (90 Base) MCG/ACT inhaler Inhale 2 puffs into the lungs every 4 (four) hours as needed for wheezing. As a RESCUE medication 1 Inhaler 1  . amitriptyline (ELAVIL) 50 MG tablet TAKE 1 TABLET BY MOUTH EVERY DAY.--- takes in qhs 90 tablet 0  . aspirin 81 MG tablet Take 81 mg by mouth every evening.     Marland Kitchen atorvastatin (LIPITOR) 80 MG tablet One tablet daily 90 tablet 3  . cilostazol (PLETAL) 100 MG tablet TAKE 1 TABLET (100 MG TOTAL) BY MOUTH 2 (TWO) TIMES DAILY. (Patient taking differently: Take 100 mg by mouth at bedtime. ) 180 tablet 2  . docusate sodium (COLACE) 100 MG capsule Take 100 mg by mouth at bedtime.    Marland Kitchen ezetimibe (ZETIA) 10 MG tablet Take 1 tablet (10 mg total) by mouth daily. 30 tablet 9  . fluticasone (FLONASE) 50 MCG/ACT nasal spray USE 2 SPRAYS IN BOTH NOSTRILS DAILY. (Patient taking differently: USE 2 SPRAYS IN BOTH NOSTRILS DAILY.--- twice daily) 16 g 2  . Fluticasone-Umeclidin-Vilant (TRELEGY ELLIPTA) 100-62.5-25 MCG/INH AEPB Inhale 1 puff into the lungs daily. (Patient taking differently: Inhale 1 puff into the lungs every morning. ) 1 each 11  . guaiFENesin (MUCINEX) 600 MG 12 hr tablet Take 600 mg by mouth 2 (two) times daily as needed.     Marland Kitchen  ipratropium-albuterol (DUONEB) 0.5-2.5 (3) MG/3ML SOLN USE 1 NEB 3 TIMES DAILY FOR 3 DAYS, THEN EVERY 6 HOURS AS NEEDED. 360 mL 0  . IRON PO Take 325 mg by mouth daily.     Marland Kitchen levothyroxine (SYNTHROID, LEVOTHROID) 112 MCG tablet TAKE 1 TABLET (112 MCG TOTAL) BY MOUTH DAILY BEFORE BREAKFAST. 90 tablet 1  . LORazepam (ATIVAN) 2 MG tablet TAKE 1 TABLET AT BEDTIME FOR ANXIETY. (Patient taking differently: 1/2 tablet at bedtime) 30 tablet 2  . roflumilast  (DALIRESP) 500 MCG TABS tablet Take 1 tablet (500 mcg total) by mouth daily. 30 tablet 11  . traZODone (DESYREL) 50 MG tablet TAKE 1/2-1 TABLET BY MOUTH AT BEDTIME AS NEEDED FOR SLEEP. 30 tablet 2  . varenicline (CHANTIX PAK) 0.5 MG X 11 & 1 MG X 42 tablet Take one 0.5 mg tablet by mouth once daily for 3 days, then increase to one 0.5 mg tablet twice daily for 4 days, then increase to one 1 mg tablet twice daily. 53 tablet 0   No current facility-administered medications on file prior to visit.      Past Medical History:  Diagnosis Date  . Bilateral leg cramps   . Bladder neoplasm   . Chronic deep vein thrombosis (DVT) of right lower extremity ([place])    05/ 2018  chronic non-occlusive DVT RLE  . COPD, frequent exacerbations ([place])    03-06-2018 per pt last exacerbation 12/ 2018  . Coronary artery disease    cardiologist-  dr Aundra Dubin-- 03-11-2018 er cath , 80% stenosis in the ostial second diagonal  . Dyspnea on minimal exertion   . Emphysema/COPD ([place])    CAT score- 17  . Fibromyalgia 1995  . GERD (gastroesophageal reflux disease)   . Hiatal hernia   . History of diverticulitis of colon    2002  s/p  sigmoid colectomy  . History of multiple pulmonary nodules    hx RUL nodules x2  s/p right VATS w/ wedge resection 09-11-2005 and 06-14-2011  both  necrotizing granulomatous inflammation w/ cystic area of necrosis & focal calcification  . History of right breast cancer oncologist-  dr Jana Hakim-- no recurrence   dx 2010--  IDC, Stage IA , ER/PR+,  (pT1c pN0) 11-09-2008 right lumpectomy;  12-18-2008 right simple mastectomy for DCIS margins;  completed chemotherapy 2010 (no radiation) and completed antiestrogen therapy  . Hx of colonic polyps    Dr. Cristina Gong -last study '11  . Hyperlipidemia   . Hypertension   . Hypothyroidism   . 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 Nocturia   . OA (osteoarthritis)    thumbs  . On supplemental oxygen therapy     03-06-2018 per pt uses only at night,  checks O2 sats at home,  stated am sat 89% after moving around average 93-94% with RA  . Osteoporosis   . Peripheral arterial occlusive disease [facility]) vascular-- dr chen/ dr Donzetta Matters   proximal right SFA severe focal stenosis with collaterals from the PFA/  04/ 2012 occluded celiac and SMA arteries with distal reconstitution w/ patent IMA  . Peripheral vascular disease ([place])    chronic DVT RLE,  mesenteric vascular disease  . Right lower lobe pulmonary nodule    Chest CT 08-29-2017  . Stage 3 severe COPD by GOLD classification ([place]) hx frequent exacerbations--   pulmologist-  dr Maryann Alar--  per lov note , dated 12-20-2017, oxyogen 2L is prescribed for use with exertion (but pt only  uses mostly at night), O2 sats on RA run the 80s , this day sat 86% RA and with 2L O2 sat 92%  . Varicose veins of leg with swelling    varicose vein surgery - Dr. Aleda Grana  . Wears glasses   . Wears hearing aid in both ears    Allergies  Allergen Reactions  . Sulfa Antibiotics Swelling    Social History   Socioeconomic History  . Marital status: Married    Spouse name: Not on file  . Number of children: 3  . Years of education: 64  . Highest education level: Not on file  Occupational History  . 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 Financial resource strain: Not on file  . Food insecurity:    Worry: Not on file    Inability: Not on file  . Transportation needs:    Medical: Not on file    Non-medical: Not on file  Tobacco Use  . Smoking status: Current Every Day Smoker    Packs/day: 0.75    Years: 50.00    Pack years: 37.50    Types: Cigarettes    Last attempt to quit: 03/18/2016    Years since quitting: 2.2  . Smokeless tobacco: Never Used  Substance and Sexual Activity  . Alcohol use: Yes    Alcohol/week: 10.0 standard drinks    Types: 10 Cans of beer per week  . Drug use: No  . Sexual activity: Not on  file  Lifestyle  . Physical activity:    Days per week: Not on file    Minutes per session: Not on file  . Stress: Not on file  Relationships  . Social connections:    Talks on phone: Not on file    Gets together: Not on file    Attends religious service: Not on file    Active member of club or organization: Not on file    Attends meetings of clubs or organizations: Not on file    Relationship status: Not on file  Other Topics Concern  . Not on file  Social History Narrative   HSG, beauty school. Married '69 -34 yrs/widowed; married '79 - 40yr/divorced; married '87.  2 sons - '70, '74, 1 srtep-son; 1 grandchild. 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 SO - good health.  NO history of abuse.  ACP - full code and all heroic measures.     Vitals:   06/23/18 1407  BP: 120/68  Pulse: 86  Resp: 16  Temp: 97.9 F (36.6 C)  SpO2: 92%   Body mass index is 23.05 kg/m.    Physical Exam  Nursing note and vitals reviewed. Constitutional: She is oriented to person, place, and time. She appears well-developed. No distress.  HENT:  Head: Normocephalic and atraumatic.  Mouth/Throat: Oropharynx is clear and moist and mucous membranes are normal.  Eyes: Pupils are equal, round, and reactive to light. Conjunctivae are normal.  Cardiovascular: Normal rate and regular rhythm.  No murmur heard. Pulses:      Dorsalis pedis pulses are 2+ on the right side, and 2+ on the left side.  Respiratory: Effort normal. No respiratory distress. She has decreased breath sounds. She has no wheezes. She has no rhonchi. She has no rales.  GI: Soft. She exhibits no mass. There is no hepatomegaly. There is no tenderness.  Musculoskeletal: She exhibits no edema.  Lymphadenopathy:    She has no cervical adenopathy.  Neurological: She is alert and oriented to person, place, and time. She has normal strength. No cranial nerve deficit. Gait normal.  Skin: Skin is warm. No rash noted. No erythema.  Psychiatric: She has  a normal mood and affect.  Well groomed, good eye contact.      ASSESSMENT AND PLAN:   Ms. BENNETTE HASTY was seen today for 6 months follow-up.  No orders of the defined types were placed in this encounter.   Anxiety disorder Stable. Continue trazodone 50 mg and Xanax 1 mg at bedtime. We discussed some side effects of medications as well as the risk of interaction between some. Follow-up in 6 months, before if needed.  Tobacco abuse We discussed adverse effects of tobacco, including COPD and bladder cancer among some. Strongly recommend starting Chantix recommended by her pulmonologist, she has taken medication before and does not recall any side effect.   GERD Problem is not well controlled. We discussed some side effects of PPIs. She was instructed to stop omeprazole and to start Protonix 40 mg 30 minutes before breakfast and Zantac 150 mg twice daily. Continue GERD precautions. Instructed to let me know how she is doing with these new medications in about 2 months. Follow-up in 6 months, before if needed.  Insomnia Problem is well controlled. We discussed some side effects of amitriptyline and trazodone as well as the risk of interaction and serotonin syndrome. Good sleep hygiene. No changes in current management. Follow-up in 6 months.  Essential hypertension BP adequately controlled. Continue nonpharmacologic treatment. Continue low-salt diet.        G. Martin[place], MD  [facility]. [place] office.

## 2018-06-23 NOTE — Assessment & Plan Note (Signed)
We discussed adverse effects of tobacco, including COPD and bladder cancer among some. Strongly recommend starting Chantix recommended by her pulmonologist, she has taken medication before and does not recall any side effect.

## 2018-06-23 NOTE — Assessment & Plan Note (Signed)
Problem is not well controlled. We discussed some side effects of PPIs. She was instructed to stop omeprazole and to start Protonix 40 mg 30 minutes before breakfast and Zantac 150 mg twice daily. Continue GERD precautions. Instructed to let me know how she is doing with these new medications in about 2 months. Follow-up in 6 months, before if needed.

## 2018-06-23 NOTE — Assessment & Plan Note (Signed)
Problem is well controlled. We discussed some side effects of amitriptyline and trazodone as well as the risk of interaction and serotonin syndrome. Good sleep hygiene. No changes in current management. Follow-up in 6 months.

## 2018-06-23 NOTE — Assessment & Plan Note (Signed)
BP adequately controlled. Continue nonpharmacologic treatment. Continue low-salt diet.

## 2018-06-27 ENCOUNTER — Encounter: Payer: Medicare Other | Admitting: Adult Health

## 2018-07-08 ENCOUNTER — Other Ambulatory Visit: Payer: Self-pay | Admitting: Nurse Practitioner

## 2018-07-08 ENCOUNTER — Ambulatory Visit: Payer: Medicare Other | Admitting: Nurse Practitioner

## 2018-07-08 DIAGNOSIS — I739 Peripheral vascular disease, unspecified: Secondary | ICD-10-CM

## 2018-07-10 ENCOUNTER — Telehealth: Payer: Self-pay | Admitting: Family Medicine

## 2018-07-10 NOTE — Telephone Encounter (Signed)
Message sent to Dr. Jordan for review and approval. 

## 2018-07-10 NOTE — Telephone Encounter (Signed)
Copied from [place] [id number]. Topic: General - Other >> Jul 10, 2018 10:05 AM [name] R wrote: Pt called in and stated she would like to back on omeprazole (PRILOSEC) 40 MG capsule  due to the med she is currently on keeping up up and not giving her any relief

## 2018-07-14 ENCOUNTER — Other Ambulatory Visit: Payer: Self-pay | Admitting: Family Medicine

## 2018-07-14 DIAGNOSIS — K219 Gastro-esophageal reflux disease without esophagitis: Secondary | ICD-10-CM

## 2018-07-14 MED ORDER — OMEPRAZOLE 40 MG PO CPDR: 40.0000 mg | DELAYED_RELEASE_CAPSULE | Freq: Two times a day (BID) | ORAL | 1 refills | Status: DC

## 2018-07-14 NOTE — Telephone Encounter (Signed)
Prescription for omeprazole 40 mg to resume twice daily was sent to her pharmacy. Stop Protonix. Med list was updated.  Thanks, BJ

## 2018-07-22 DIAGNOSIS — J449 Chronic obstructive pulmonary disease, unspecified: Secondary | ICD-10-CM | POA: Diagnosis not present

## 2018-07-25 ENCOUNTER — Telehealth: Payer: Self-pay | Admitting: Internal Medicine

## 2018-07-25 ENCOUNTER — Other Ambulatory Visit: Payer: Self-pay | Admitting: Family Medicine

## 2018-07-25 DIAGNOSIS — F419 Anxiety disorder, unspecified: Secondary | ICD-10-CM

## 2018-07-25 DIAGNOSIS — G4700 Insomnia, unspecified: Secondary | ICD-10-CM

## 2018-07-25 MED ORDER — VARENICLINE TARTRATE 1 MG PO TABS: 1.0000 mg | ORAL_TABLET | Freq: Two times a day (BID) | ORAL | 1 refills | Status: DC

## 2018-07-25 NOTE — Telephone Encounter (Signed)
Called and spoke with pt letting her know MR said to send continuation pack of chantix to her pharmacy.  Pt expressed understanding. Rx has been sent to preferred pharmacy. Nothing further needed.

## 2018-07-25 NOTE — Telephone Encounter (Signed)
Called and spoke to pt. Pt is requesting Rx for continuing pack of Chantix to be sent to [facility]. Per our records starter kit was prescribed on 06/20/18. Pt stated that she has two more dose of the starter kit.  MR please advise. Thanks

## 2018-07-25 NOTE — Telephone Encounter (Signed)
Ok to send continuatio pak chantix

## 2018-08-20 ENCOUNTER — Other Ambulatory Visit: Payer: Self-pay | Admitting: Internal Medicine

## 2018-08-21 ENCOUNTER — Ambulatory Visit: Payer: Self-pay | Admitting: *Deleted

## 2018-08-22 ENCOUNTER — Other Ambulatory Visit: Payer: Self-pay | Admitting: *Deleted

## 2018-08-22 DIAGNOSIS — J449 Chronic obstructive pulmonary disease, unspecified: Secondary | ICD-10-CM | POA: Diagnosis not present

## 2018-08-24 ENCOUNTER — Other Ambulatory Visit: Payer: Self-pay | Admitting: Internal Medicine

## 2018-08-27 NOTE — Patient Outreach (Signed)
[facility]) Care Management  08/22/2018  [name] [birth year] [id number]  RN Health Coach telephone call to patient.  [employer] compliance verified. Per patient she is feeling the best she has in a long time. Patient received flu shot on [id number]. Patient is currently not on any medications for BP. She is going to talk with her physician because she thinks it was left off by mistake. Patient didn't realize until RN went over medication list. Patient is working on stop smoking. Patient has agreed to follow up outreach calls.   Current Medications:  Current Outpatient Medications  Medication Sig Dispense Refill  . albuterol (PROVENTIL HFA;VENTOLIN HFA) 108 (90 Base) MCG/ACT inhaler Inhale 2 puffs into the lungs every 4 (four) hours as needed for wheezing. As a RESCUE medication 1 Inhaler 1  . amitriptyline (ELAVIL) 50 MG tablet TAKE 1 TABLET BY MOUTH EVERY DAY.--- takes in qhs 90 tablet 0  . aspirin 81 MG tablet Take 81 mg by mouth every evening.     Marland Kitchen atorvastatin (LIPITOR) 80 MG tablet One tablet daily 90 tablet 3  . CHANTIX 1 MG tablet TAKE 1 TABLET BY MOUTH TWICE A DAY 180 tablet 0  . cilostazol (PLETAL) 100 MG tablet TAKE 1 TABLET BY MOUTH TWICE A DAY 180 tablet 2  . docusate sodium (COLACE) 100 MG capsule Take 100 mg by mouth at bedtime.    Marland Kitchen ezetimibe (ZETIA) 10 MG tablet Take 1 tablet (10 mg total) by mouth daily. 30 tablet 9  . fluticasone (FLONASE) 50 MCG/ACT nasal spray USE 2 SPRAYS IN BOTH NOSTRILS DAILY. (Patient taking differently: USE 2 SPRAYS IN BOTH NOSTRILS DAILY.--- twice daily) 16 g 2  . Fluticasone-Umeclidin-Vilant (TRELEGY ELLIPTA) 100-62.5-25 MCG/INH AEPB Inhale 1 puff into the lungs daily. (Patient taking differently: Inhale 1 puff into the lungs every morning. ) 1 each 11  . guaiFENesin (MUCINEX) 600 MG 12 hr tablet Take 600 mg by mouth 2 (two) times daily as needed.     Marland Kitchen ipratropium-albuterol (DUONEB) 0.5-2.5 (3) MG/3ML SOLN USE 1 NEB 3 TIMES DAILY  FOR 3 DAYS, THEN EVERY 6 HOURS AS NEEDED. 360 mL 0  . IRON PO Take 325 mg by mouth daily.     Marland Kitchen levothyroxine (SYNTHROID, LEVOTHROID) 112 MCG tablet TAKE 1 TABLET (112 MCG TOTAL) BY MOUTH DAILY BEFORE BREAKFAST. 90 tablet 1  . LORazepam (ATIVAN) 2 MG tablet TAKE 1 TABLET AT BEDTIME FOR ANXIETY. 30 tablet 2  . omeprazole (PRILOSEC) 40 MG capsule Take 1 capsule (40 mg total) by mouth 2 (two) times daily. 180 capsule 1  . ranitidine (ZANTAC) 150 MG tablet Take 1 tablet (150 mg total) by mouth 2 (two) times daily. 60 tablet 3  . roflumilast (DALIRESP) 500 MCG TABS tablet Take 1 tablet (500 mcg total) by mouth daily. 30 tablet 11  . traZODone (DESYREL) 50 MG tablet TAKE 1/2-1 TABLET BY MOUTH AT BEDTIME AS NEEDED FOR SLEEP. 90 tablet 1   No current facility-administered medications for this visit.     Functional Status:  In your present state of health, do you have any difficulty performing the following activities: 05/16/2018 03/11/2018  Hearing? [name]  Vision? N N  Difficulty concentrating or making decisions? N N  Walking or climbing stairs? N N  Dressing or bathing? N N  Doing errands, shopping? N -  Preparing Food and eating ? N -  Using the Toilet? N -  In the past six months, have you accidently leaked urine? Y -  Do you have problems with loss of bowel control? N -  Managing your Medications? N -  Managing your Finances? N -  Housekeeping or managing your Housekeeping? N -  Some recent data might be hidden    Fall/Depression Screening: Fall Risk  05/16/2018 08/12/2015  Falls in the past year? No No   PHQ 2/9 Scores 05/16/2018 01/03/2018 08/12/2015  PHQ - 2 Score 0 0 0   THN CM Care Plan Problem One     Most Recent Value  Care Plan Problem One  Knowledge Deficit in self managegement of COPD  Role Documenting the Problem One  [place] for Problem One  Active  THN Long Term Goal   Patient will not have any admissions for COPD exacerbation within the next 90 days  THN  Long Term Goal Start Date  08/22/18  Interventions for Problem One Long Term Goal  RN reiterated adhering to medications as prescribed. RN discussed on working on stop smoking. RN will follow upwith further discussion  THN CM Short Term Goal #1   Patient will start decreasing cigarette smoking to quit within the next 30 days  THN CM Short Term Goal #1 Start Date  08/22/18  Interventions for Short Term Goal #1  Patient is slowly working at decreasing smoking. Patient has chantix. RN will follow up on progress  THN CM Short Term Goal #2   Patient will be able to verbalize the zones and action plan of COPD within the next 30 days  THN CM Short Term Goal #2 Met Date  08/22/18  [facility] CM Short Term Goal #3  Patient will verbalize receiving Advance Directive Packet within the next 30 days  THN CM Short Term Goal #3 Met Date  08/22/18  [facility] CM Short Term Goal #4  Patient will verbalize having a better understanding how COPD affects physical activity within the next 30 days  THN CM Short Term Goal #4 Start Date  08/22/18  Interventions for Short Term Goal #4  RN discussed activity. RN sent educational material on COPD and physical activity. RN will follow up for further discussipm       Assessment:  Received flu shot [id number] Not on BP medication Patient is feeling better.  Plan:  RN sent 2020 calendar book RN discussed medication adherence Patient will follow up on BP medications with physician RN sent Clinical key education COPD and physical activity RN sent COPD action form RN will follow up within the month of January  [name] [place] [facility] [phone]

## 2018-09-14 ENCOUNTER — Other Ambulatory Visit: Payer: Self-pay | Admitting: Family Medicine

## 2018-09-14 DIAGNOSIS — K219 Gastro-esophageal reflux disease without esophagitis: Secondary | ICD-10-CM

## 2018-09-16 ENCOUNTER — Other Ambulatory Visit: Payer: Self-pay | Admitting: Family Medicine

## 2018-09-21 DIAGNOSIS — J449 Chronic obstructive pulmonary disease, unspecified: Secondary | ICD-10-CM | POA: Diagnosis not present

## 2018-10-17 ENCOUNTER — Other Ambulatory Visit: Payer: Self-pay | Admitting: Family Medicine

## 2018-10-22 DIAGNOSIS — J449 Chronic obstructive pulmonary disease, unspecified: Secondary | ICD-10-CM | POA: Diagnosis not present

## 2018-10-26 ENCOUNTER — Other Ambulatory Visit: Payer: Self-pay | Admitting: Family Medicine

## 2018-10-26 DIAGNOSIS — F419 Anxiety disorder, unspecified: Secondary | ICD-10-CM

## 2018-10-26 DIAGNOSIS — G4700 Insomnia, unspecified: Secondary | ICD-10-CM

## 2018-10-27 ENCOUNTER — Other Ambulatory Visit: Payer: Self-pay | Admitting: *Deleted

## 2018-10-27 NOTE — Telephone Encounter (Signed)
Last Rx given on 10/14 #30 with 2 ref

## 2018-10-28 ENCOUNTER — Encounter: Payer: Self-pay | Admitting: Primary Care

## 2018-10-28 ENCOUNTER — Ambulatory Visit: Payer: Medicare Other | Admitting: Primary Care

## 2018-10-28 VITALS — BP 156/94 | HR 90 | Temp 98.1°F | Ht 66.0 in | Wt 146.0 lb

## 2018-10-28 DIAGNOSIS — J449 Chronic obstructive pulmonary disease, unspecified: Secondary | ICD-10-CM

## 2018-10-28 DIAGNOSIS — J441 Chronic obstructive pulmonary disease with (acute) exacerbation: Secondary | ICD-10-CM | POA: Diagnosis not present

## 2018-10-28 MED ORDER — TIOTROPIUM BROMIDE MONOHYDRATE 2.5 MCG/ACT IN AERS: 2.0000 | INHALATION_SPRAY | Freq: Every day | RESPIRATORY_TRACT | 2 refills | Status: DC

## 2018-10-28 MED ORDER — BUDESONIDE-FORMOTEROL FUMARATE 160-4.5 MCG/ACT IN AERO: 2.0000 | INHALATION_SPRAY | Freq: Two times a day (BID) | RESPIRATORY_TRACT | 6 refills | Status: DC

## 2018-10-28 MED ORDER — DOXYCYCLINE HYCLATE 100 MG PO TABS: 100.0000 mg | ORAL_TABLET | Freq: Two times a day (BID) | ORAL | 0 refills | Status: DC

## 2018-10-28 MED ORDER — IPRATROPIUM-ALBUTEROL 0.5-2.5 (3) MG/3ML IN SOLN
3.0000 mL | RESPIRATORY_TRACT | Status: AC
  Administered 2018-10-28: 3 mL via RESPIRATORY_TRACT

## 2018-10-28 MED ORDER — BUDESONIDE-FORMOTEROL FUMARATE 160-4.5 MCG/ACT IN AERO: 2.0000 | INHALATION_SPRAY | Freq: Two times a day (BID) | RESPIRATORY_TRACT | 0 refills | Status: DC

## 2018-10-28 MED ORDER — PREDNISONE 10 MG PO TABS: ORAL_TABLET | ORAL | 0 refills | Status: DC

## 2018-10-28 NOTE — Patient Instructions (Addendum)
COPD exacerbation: RX prednisone taper as prescribed  Doxycycline 1 tab twice daily x 7 days   COPD maintenance : Stop TRELEGY Start Symbicort- take 2 puffs twice a day (every day) Start Spiriva- take 2 puffs in am (every day) May need patient assistance   Office treatment: Duoneb x 1  Follow-up: Return in 2-3 weeks for follow-up with NP      Chronic Obstructive Pulmonary Disease Exacerbation Chronic obstructive pulmonary disease (COPD) is a long-term (chronic) lung problem. In COPD, the flow of air from the lungs is limited. COPD exacerbations are times that breathing gets worse and you need more than your normal treatment. Without treatment, they can be life threatening. If they happen often, your lungs can become more damaged. If your COPD gets worse, your doctor may treat you with:  Medicines.  Oxygen.  Different ways to clear your airway, such as using a mask. Follow these instructions at home: Medicines  Take over-the-counter and prescription medicines only as told by your doctor.  If you take an antibiotic or steroid medicine, do not stop taking the medicine even if you start to feel better.  Keep up with shots (vaccinations) as told by your doctor. Be sure to get a yearly (annual) flu shot. Lifestyle  Do not smoke. If you need help quitting, ask your doctor.  Eat healthy foods.  Exercise regularly.  Get plenty of sleep.  Avoid tobacco smoke and other things that can bother your lungs.  Wash your hands often with soap and water. This will help keep you from getting an infection. If you cannot use soap and water, use hand sanitizer.  During flu season, avoid areas that are crowded with people. General instructions  Drink enough fluid to keep your pee (urine) clear or pale yellow. Do not do this if your doctor has told you not to.  Use a cool mist machine (vaporizer).  If you use oxygen or a machine that turns medicine into a mist (nebulizer), continue to  use it as told.  Follow all instructions for rehabilitation. These are steps you can take to make your body work better.  Keep all follow-up visits as told by your doctor. This is important. Contact a doctor if:  Your COPD symptoms get worse than normal. Get help right away if:  You are short of breath and it gets worse.  You have trouble talking.  You have chest pain.  You cough up blood.  You have a fever.  You keep throwing up (vomiting).  You feel weak or you pass out (faint).  You feel confused.  You are not able to sleep because of your symptoms.  You are not able to do daily activities. Summary  COPD exacerbations are times that breathing gets worse and you need more treatment than normal.  COPD exacerbations can be very serious and may cause your lungs to become more damaged.  Do not smoke. If you need help quitting, ask your doctor.  Stay up-to-date on your shots. Get a flu shot every year. This information is not intended to replace advice given to you by your health care provider. Make sure you discuss any questions you have with your health care provider. Document Released: 09/20/2011 Document Revised: 11/05/2016 Document Reviewed: 11/05/2016 Elsevier Interactive Patient Education  2019 [employer].

## 2018-10-28 NOTE — Progress Notes (Signed)
@Patient  ID: [name], female    DOB: 08-03-1946, 73 y.o.   MRN: [id number]  Chief Complaint  Patient presents with  . Acute Visit    cough with clear mucus, whezing, SOB with exertion, head congestion, right ear ache, headache     Referring provider: [place], [name], MD  HPI: 73 year old female, current smoker. PMH significant for COPD GOLD III with frequent exacerbations, allergic rhinitis, GERD, CAD, HTN, hypothyroidism, breast cancer. Patient of Dr. Chase Caller, last seen on 06/20/18. Maintained on Trelegy and Duonebs as needed.   10/28/2018 Patient presents today for an acute visit with complaints of cough with clear mucus and head congestion. Associated wheezing. States that she quit smoking 5 months ago. Trelegy is too expensive and reports that she has only been using it as needed since the summer. Also thinks her Trelegy has stained her teeth. She has been taking mucinex and advil for cough/congestion with some improvement but states that when she stops taking them her symptoms return.    Allergies  Allergen Reactions  . Sulfa Antibiotics Swelling    Immunization History  Administered Date(s) Administered  . Influenza, High Dose Seasonal PF 10/03/2017, 06/20/2018  . Influenza,inj,Quad PF,6+ Mos 08/10/2014, 08/12/2015, 07/03/2016  . Pneumococcal Conjugate-13 08/10/2014  . Pneumococcal Polysaccharide-23 08/12/2015  . Td 10/16/2007  . Zoster 11/09/2010    Past Medical History:  Diagnosis Date  . Bilateral leg cramps   . Bladder neoplasm   . Chronic deep vein thrombosis (DVT) of right lower extremity ([place])    05/ 2018  chronic non-occlusive DVT RLE  . COPD, frequent exacerbations ([place])    03-06-2018 per pt last exacerbation 12/ 2018  . Coronary artery disease    cardiologist-  dr Aundra Dubin-- 03-11-2018 er cath , 80% stenosis in the ostial second diagonal  . Dyspnea on minimal exertion   . Emphysema/COPD ([place])    CAT score- 17  . Fibromyalgia 1995  . GERD  (gastroesophageal reflux disease)   . Hiatal hernia   . History of diverticulitis of colon    2002  s/p  sigmoid colectomy  . History of multiple pulmonary nodules    hx RUL nodules x2  s/p right VATS w/ wedge resection 09-11-2005 and 06-14-2011  both  necrotizing granulomatous inflammation w/ cystic area of necrosis & focal calcification  . History of right breast cancer oncologist-  dr Jana Hakim-- no recurrence   dx 2010--  IDC, Stage IA , ER/PR+,  (pT1c pN0) 11-09-2008 right lumpectomy;  12-18-2008 right simple mastectomy for DCIS margins;  completed chemotherapy 2010 (no radiation) and completed antiestrogen therapy  . Hx of colonic polyps    Dr. Cristina Gong -last study '11  . Hyperlipidemia   . Hypertension   . Hypothyroidism   . 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 Nocturia   . OA (osteoarthritis)    thumbs  . On supplemental oxygen therapy    03-06-2018 per pt uses only at night,  checks O2 sats at home,  stated am sat 89% after moving around average 93-94% with RA  . Osteoporosis   . Peripheral arterial occlusive disease [facility]) vascular-- dr chen/ dr Donzetta Matters   proximal right SFA severe focal stenosis with collaterals from the PFA/  04/ 2012 occluded celiac and SMA arteries with distal reconstitution w/ patent IMA  . Peripheral vascular disease ([place])    chronic DVT RLE,  mesenteric vascular disease  . Right lower lobe pulmonary nodule  Chest CT 08-29-2017  . Stage 3 severe COPD by GOLD classification ([place]) hx frequent exacerbations--   pulmologist-  dr Maryann Alar--  per lov note , dated 12-20-2017, oxyogen 2L is prescribed for use with exertion (but pt only uses mostly at night), O2 sats on RA run the 80s , this day sat 86% RA and with 2L O2 sat 92%  . Varicose veins of leg with swelling    varicose vein surgery - Dr. Aleda Grana  . Wears glasses   . Wears hearing aid in both ears     Tobacco History: Social History   Tobacco Use  Smoking  Status Former Smoker  . Packs/day: 0.75  . Years: 50.00  . Pack years: 37.50  . Types: Cigarettes  . Last attempt to quit: 05/18/2016  . Years since quitting: 2.4  Smokeless Tobacco Never Used   Counseling given: Not Answered   Outpatient Medications Prior to Visit  Medication Sig Dispense Refill  . albuterol (PROVENTIL HFA;VENTOLIN HFA) 108 (90 Base) MCG/ACT inhaler Inhale 2 puffs into the lungs every 4 (four) hours as needed for wheezing. As a RESCUE medication 1 Inhaler 1  . amitriptyline (ELAVIL) 50 MG tablet TAKE 1 TABLET BY MOUTH AT BEDTIME 90 tablet 0  . aspirin 81 MG tablet Take 81 mg by mouth every evening.     Marland Kitchen atorvastatin (LIPITOR) 80 MG tablet One tablet daily 90 tablet 3  . cilostazol (PLETAL) 100 MG tablet TAKE 1 TABLET BY MOUTH TWICE A DAY 180 tablet 2  . docusate sodium (COLACE) 100 MG capsule Take 100 mg by mouth at bedtime.    . fluticasone (FLONASE) 50 MCG/ACT nasal spray USE 2 SPRAYS IN BOTH NOSTRILS DAILY.--- twice daily 16 g 0  . Fluticasone-Umeclidin-Vilant (TRELEGY ELLIPTA) 100-62.5-25 MCG/INH AEPB Inhale 1 puff into the lungs daily. (Patient taking differently: Inhale 1 puff into the lungs every morning. ) 1 each 11  . guaiFENesin (MUCINEX) 600 MG 12 hr tablet Take 600 mg by mouth 2 (two) times daily as needed.     Marland Kitchen ipratropium-albuterol (DUONEB) 0.5-2.5 (3) MG/3ML SOLN USE 1 NEB 3 TIMES DAILY FOR 3 DAYS, THEN EVERY 6 HOURS AS NEEDED. 360 mL 0  . IRON PO Take 325 mg by mouth daily.     Marland Kitchen levothyroxine (SYNTHROID, LEVOTHROID) 112 MCG tablet TAKE 1 TABLET (112 MCG TOTAL) BY MOUTH DAILY BEFORE BREAKFAST. 90 tablet 1  . LORazepam (ATIVAN) 2 MG tablet TAKE 1 TABLET AT BEDTIME FOR ANXIETY. 30 tablet 2  . omeprazole (PRILOSEC) 40 MG capsule Take 1 capsule (40 mg total) by mouth 2 (two) times daily. 180 capsule 1  . roflumilast (DALIRESP) 500 MCG TABS tablet Take 1 tablet (500 mcg total) by mouth daily. 30 tablet 11  . traZODone (DESYREL) 50 MG tablet TAKE 1/2-1 TABLET  BY MOUTH AT BEDTIME AS NEEDED FOR SLEEP. 90 tablet 1  . CHANTIX 1 MG tablet TAKE 1 TABLET BY MOUTH TWICE A DAY 180 tablet 0  . ranitidine (ZANTAC) 150 MG tablet Take 1 tablet (150 mg total) by mouth 2 (two) times daily. 60 tablet 3  . ezetimibe (ZETIA) 10 MG tablet Take 1 tablet (10 mg total) by mouth daily. 30 tablet 9   No facility-administered medications prior to visit.     Review of Systems  Review of Systems  Constitutional: Negative.   HENT: Positive for congestion.   Respiratory: Positive for cough, shortness of breath and wheezing.   Neurological: Positive for headaches.    Physical Exam  BP (!) 156/94 (BP Location: Left Arm, Cuff Size: Normal)   Pulse 90   Temp 98.1 F (36.7 C)   Ht 5\' 6"  (1.676 m)   Wt 146 lb (66.2 kg)   SpO2 93%   BMI 23.57 kg/m  Physical Exam Constitutional:      General: She is not in acute distress.    Appearance: She is normal weight. She is not ill-appearing.  HENT:     Head: Normocephalic and atraumatic.     Nose: Nose normal.     Mouth/Throat:     Mouth: Mucous membranes are moist.     Pharynx: Oropharynx is clear.  Eyes:     Extraocular Movements: Extraocular movements intact.     Pupils: Pupils are equal, round, and reactive to light.  Neck:     Musculoskeletal: Normal range of motion and neck supple.  Cardiovascular:     Rate and Rhythm: Normal rate and regular rhythm.  Pulmonary:     Effort: No respiratory distress.     Breath sounds: Wheezing and rhonchi present.  Neurological:     Mental Status: She is alert.  Psychiatric:        Mood and Affect: Mood normal.        Behavior: Behavior normal.        Thought Content: Thought content normal.        Judgment: Judgment normal.      Lab Results:  CBC    Component Value Date/Time   WBC 6.8 03/11/2018 0900   RBC 5.23 (H) 03/11/2018 0900   HGB 16.3 (H) 03/11/2018 0900   HGB 13.5 08/27/2017 1051   HGB 13.7  1045   HCT 47.1 (H) 03/11/2018 0900   HCT 40.3  08/27/2017 1051   HCT 42.4  1045   PLT 347 03/11/2018 0900   PLT 480 (H) 08/27/2017 1051   MCV 90.1 03/11/2018 0900   MCV 87 08/27/2017 1051   MCV 87.8  1045   MCH 31.2 03/11/2018 0900   MCHC 34.6 03/11/2018 0900   RDW 14.6 03/11/2018 0900   RDW 17.2 (H) 08/27/2017 1051   RDW 14.9 (H)  1045   LYMPHSABS 1.2 11/14/2017 1137   LYMPHSABS 1.1  1045   MONOABS 0.5 11/14/2017 1137   MONOABS 0.7  1045   EOSABS 0.3 11/14/2017 1137   EOSABS 0.6 (H)  1045   BASOSABS 0.0 11/14/2017 1137   BASOSABS 0.0  1045    BMET    Component Value Date/Time   NA 135 04/02/2018 1033   NA 141 02/17/2015 1249   K 5.2 04/02/2018 1033   K 4.3 02/17/2015 1249   CL 97 04/02/2018 1033   CL 108 (H) 08/21/2012 1501   CO2 23 04/02/2018 1033   CO2 25 02/17/2015 1249   GLUCOSE 95 04/02/2018 1033   GLUCOSE 110 (H) 03/11/2018 0900   GLUCOSE 85 02/17/2015 1249   GLUCOSE 101 (H) 08/21/2012 1501   BUN 9 04/02/2018 1033   BUN 16.6 02/17/2015 1249   CREATININE 0.79 04/02/2018 1033   CREATININE 0.93 01/03/2018 1601   CREATININE 0.8 02/17/2015 1249   CALCIUM 10.0 04/02/2018 1033   CALCIUM 9.3 02/17/2015 1249   GFRNONAA 74 04/02/2018 1033   GFRAA 86 04/02/2018 1033    BNP    Component Value Date/Time   BNP 28.4 07/02/2016 1235    ProBNP No results found for: PROBNP  Imaging: No results found.   Assessment & Plan:  73 year old female, former smoker quit 5  months ago.  Past medical history significant for severe COPD with frequent exacerbations.  She has tried [employer] but states that it was too expensive and she did not tolerate it.  She is maintained on Trelegy but again states that this is too expensive and  stopped taking it daily since this summer. She also reports that it sustained her teeth. Seen today for acute exacerbation of her COPD symptoms. Plan to change Trelegy to Symbicort 160 and Spiriva. Needs prednisone taper and  doxycycline course.  Given DuoNeb in office with mild improvement, she was able to cough up a large amount of mucus.  Plan to follow-up with her in 2 to 3 weeks.  May need patient assistance for Symbicort and Spiriva.  COPD with acute exacerbation (HCC) - Acute exacerbation needs RX prednisone taper and Doxycyline course  - Given Duoneb x1 in office - Stop Trelegy. Start Symbicort and Spiriva (samples given) - Follow up in 2-3 weeks    Martyn Ehrich, NP 10/28/2018

## 2018-10-28 NOTE — Assessment & Plan Note (Signed)
-   Acute exacerbation needs RX prednisone taper and Doxycyline course  - Given Duoneb x1 in office - Stop Trelegy. Start Symbicort and Spiriva (samples given) - Follow up in 2-3 weeks

## 2018-10-29 NOTE — Patient Outreach (Addendum)
[facility]) Care Management  10/29/2018   [name] 09-Oct-1946 [id number]  RN Health Coach telephone call to patient.  [employer] compliance verified. Per patient she is doing fair. Patient stated that she is feeling tired a lot. Patient stated that she has a continuous cough. She has been on a z pack and coricidin . Per patient she feels better while she is on the medication but  the symptoms come back. Patient has not followed up with her Dr. Shelly [name] discussed what was happening with patient and0 that she needs to call her physician and make her aware. Patient has agreed to further outreach calls.     Current Medications:  Current Outpatient Medications  Medication Sig Dispense Refill  . albuterol (PROVENTIL HFA;VENTOLIN HFA) 108 (90 Base) MCG/ACT inhaler Inhale 2 puffs into the lungs every 4 (four) hours as needed for wheezing. As a RESCUE medication 1 Inhaler 1  . amitriptyline (ELAVIL) 50 MG tablet TAKE 1 TABLET BY MOUTH AT BEDTIME 90 tablet 0  . aspirin 81 MG tablet Take 81 mg by mouth every evening.     Marland Kitchen atorvastatin (LIPITOR) 80 MG tablet One tablet daily 90 tablet 3  . budesonide-formoterol (SYMBICORT) 160-4.5 MCG/ACT inhaler Inhale 2 puffs into the lungs 2 (two) times daily. 1 Inhaler 0  . budesonide-formoterol (SYMBICORT) 160-4.5 MCG/ACT inhaler Inhale 2 puffs into the lungs 2 (two) times daily. 1 Inhaler 6  . cilostazol (PLETAL) 100 MG tablet TAKE 1 TABLET BY MOUTH TWICE A DAY 180 tablet 2  . docusate sodium (COLACE) 100 MG capsule Take 100 mg by mouth at bedtime.    Marland Kitchen doxycycline (VIBRA-TABS) 100 MG tablet Take 1 tablet (100 mg total) by mouth 2 (two) times daily. 14 tablet 0  . ezetimibe (ZETIA) 10 MG tablet Take 1 tablet (10 mg total) by mouth daily. 30 tablet 9  . fluticasone (FLONASE) 50 MCG/ACT nasal spray USE 2 SPRAYS IN BOTH NOSTRILS DAILY.--- twice daily 16 g 0  . Fluticasone-Umeclidin-Vilant (TRELEGY ELLIPTA) 100-62.5-25 MCG/INH AEPB Inhale 1 puff into the  lungs daily. (Patient taking differently: Inhale 1 puff into the lungs every morning. ) 1 each 11  . guaiFENesin (MUCINEX) 600 MG 12 hr tablet Take 600 mg by mouth 2 (two) times daily as needed.     Marland Kitchen ipratropium-albuterol (DUONEB) 0.5-2.5 (3) MG/3ML SOLN USE 1 NEB 3 TIMES DAILY FOR 3 DAYS, THEN EVERY 6 HOURS AS NEEDED. 360 mL 0  . IRON PO Take 325 mg by mouth daily.     Marland Kitchen levothyroxine (SYNTHROID, LEVOTHROID) 112 MCG tablet TAKE 1 TABLET (112 MCG TOTAL) BY MOUTH DAILY BEFORE BREAKFAST. 90 tablet 1  . LORazepam (ATIVAN) 2 MG tablet TAKE 1 TABLET AT BEDTIME FOR ANXIETY. 30 tablet 2  . omeprazole (PRILOSEC) 40 MG capsule Take 1 capsule (40 mg total) by mouth 2 (two) times daily. 180 capsule 1  . predniSONE (DELTASONE) 10 MG tablet Take 4 tabs po daily x 2 days; then 3 tabs for 2 days; then 2 tabs for 2 days; then 1 tab for 2 days 20 tablet 0  . roflumilast (DALIRESP) 500 MCG TABS tablet Take 1 tablet (500 mcg total) by mouth daily. 30 tablet 11  . Tiotropium Bromide Monohydrate (SPIRIVA RESPIMAT) 2.5 MCG/ACT AERS Inhale 2 puffs into the lungs daily. 4 g 2  . traZODone (DESYREL) 50 MG tablet TAKE 1/2-1 TABLET BY MOUTH AT BEDTIME AS NEEDED FOR SLEEP. 90 tablet 1   No current facility-administered medications for this visit.  Functional Status:  In your present state of health, do you have any difficulty performing the following activities: 10/27/2018 05/16/2018  Hearing? [name]  Vision? N N  Difficulty concentrating or making decisions? N N  Walking or climbing stairs? N N  Dressing or bathing? N N  Doing errands, shopping? N N  Preparing Food and eating ? N N  Using the Toilet? N N  In the past six months, have you accidently leaked urine? Y Y  Do you have problems with loss of bowel control? N N  Managing your Medications? N N  Managing your Finances? N N  Housekeeping or managing your Housekeeping? N N  Some recent data might be hidden    Fall/Depression Screening: Fall Risk  10/27/2018  05/16/2018 08/12/2015  Falls in the past year? 0 No No   PHQ 2/9 Scores 10/27/2018 05/16/2018 01/03/2018 08/12/2015  PHQ - 2 Score 0 0 0 0   [facility] CM Care Plan Problem One     Most Recent Value  Care Plan Problem One  Knowledge Deficit in self managegement of COPD  Role Documenting the Problem One  [place] for Problem One  Active  [facility] Long Term Goal   Patient will not have any admissions for COPD exacerbation within the next 90 days  [facility]N Long Term Goal Start Date  10/29/18  Interventions for Problem One Long Term Goal  RN discussed patient symptoms and informaed patient that she needs to contact her physician for care.  [facility] CM Short Term Goal #1   Patient will start decreasing cigarette smoking to quit within the next 30 days  [facility] CM Short Term Goal #1 Met Date  10/29/18  [facility] CM Short Term Goal #4  Patient will verbalize having a better understanding how COPD affects physical activity within the next 30 days  Interventions for Short Term Goal #4  RN reiterated that with COPD ther are moment of fatigue. RN discussed rest time. RN will follow up with further discussion and teach back      Assessment:  Patient has a  cough Patient  has not made her physician aware of symptoms persist Patient has had multiple teeth pulled and crowns  Plan: Patient will call physician and make appointment RN sent patient educational material on bronchitis RN will follow up outreach within the month of March  [name] [facility] [phone]

## 2018-10-30 ENCOUNTER — Telehealth: Payer: Self-pay | Admitting: Internal Medicine

## 2018-10-30 MED ORDER — IPRATROPIUM-ALBUTEROL 0.5-2.5 (3) MG/3ML IN SOLN: RESPIRATORY_TRACT | 0 refills | Status: DC

## 2018-10-30 NOTE — Telephone Encounter (Signed)
Refill request for Duo neb sent to [facility].  Nothing further at this time.  Per MR, 06/20/2018- continue trelegy, mucinex scheduled with duoneb as needed - if you were intolerant to daliresp in past - please let CMA know -              - take it off med list ,               - add to allergy list

## 2018-10-31 NOTE — Telephone Encounter (Signed)
Pt called in about the status of this refill, she stated she has been out since tues and she is not able to sleep without this med   Pharmacy - [facility] [address] [facility]

## 2018-11-05 NOTE — Progress Notes (Signed)
@Patient  ID: [name], female    DOB: October 16, 1945, 73 y.o.   MRN: [id number]  Chief Complaint  Patient presents with  . Acute Visit    States she completed her prednisone and antibiotic monday and she is still feeling increased SOB and chest tightness. States she still has a cough with clear thick mucous. States she continues to wheeze as welll.     Referring provider: [place], [name], MD  HPI: 73 year old female, former smoker (quit smoking 5 months ago). PMH significant for COPD GOLD III with frequent exacerbations, allergic rhinitis, GERD, CAD, HTN, hypothyroidism, breast cancer. Patient of Dr. Chase Caller, last seen on 06/20/18. Maintained on Trelegy and Duonebs as needed.   10/28/2018 Patient presents today for an acute visit with complaints of cough with clear mucus and head congestion. Associated wheezing. States that she quit smoking 5 months ago. Trelegy is too expensive and reports that she has only been using it as needed since the summer. Also thinks her Trelegy has stained her teeth. She has been taking mucinex and advil for cough/congestion with some improvement but states that when she stops taking them her symptoms return. Given course doxycycline and prednisone taper. Try samples of Symbicort and Spiriva.      11/06/2018 Feels a little better, but not 100%.  Still has a congested cough with thick creamy white mucus. States that its very hard to get sputum up. Breathing is ok sitting, experiences shortness of breath with exertion. Using nebulzier 3 times a day. She has been using both Trelegy and Symbicort/Spiriva inhalers together despite being told to STOP Trelegy. Has not noticed an improvement with either. States that she has been taking mucinex daily and using her flutter valve.    Testing: >>  11/06/2018 Spirometry - FVC 2.1 (66%), FEV1 1.0 (43%), ratio 49 >>  11/06/2018 Ambulatory O2- 96% RA  >> 08/29/17 CT Chest - New pulmonary nodules 6 mm right lower lobe nodular  density is somewhat ill-defined, but new since the prior. Irregular right lower lobe 6 mm density, Left upper lobe subpleural 5 mm nodule, Medial left lower lobe 4 mm nodule    Allergies  Allergen Reactions  . Sulfa Antibiotics Swelling    Immunization History  Administered Date(s) Administered  . Influenza, High Dose Seasonal PF 10/03/2017, 06/20/2018  . Influenza,inj,Quad PF,6+ Mos 08/10/2014, 08/12/2015, 07/03/2016  . Pneumococcal Conjugate-13 08/10/2014  . Pneumococcal Polysaccharide-23 08/12/2015  . Td 10/16/2007  . Zoster 11/09/2010    Past Medical History:  Diagnosis Date  . Bilateral leg cramps   . Bladder neoplasm   . Chronic deep vein thrombosis (DVT) of right lower extremity ([place])    05/ 2018  chronic non-occlusive DVT RLE  . COPD, frequent exacerbations ([place])    03-06-2018 per pt last exacerbation 12/ 2018  . Coronary artery disease    cardiologist-  dr Aundra Dubin-- 03-11-2018 er cath , 80% stenosis in the ostial second diagonal  . Dyspnea on minimal exertion   . Emphysema/COPD ([place])    CAT score- 17  . Fibromyalgia 1995  . GERD (gastroesophageal reflux disease)   . Hiatal hernia   . History of diverticulitis of colon    2002  s/p  sigmoid colectomy  . History of multiple pulmonary nodules    hx RUL nodules x2  s/p right VATS w/ wedge resection 09-11-2005 and 06-14-2011  both  necrotizing granulomatous inflammation w/ cystic area of necrosis & focal calcification  . History of right breast cancer oncologist-  dr Jana Hakim-- no recurrence   dx 2010--  IDC, Stage IA , ER/PR+,  (pT1c pN0) 11-09-2008 right lumpectomy;  12-18-2008 right simple mastectomy for DCIS margins;  completed chemotherapy 2010 (no radiation) and completed antiestrogen therapy  . Hx of colonic polyps    Dr. Cristina Gong -last study '11  . Hyperlipidemia   . Hypertension   . Hypothyroidism   . 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 Nocturia   . OA (osteoarthritis)      thumbs  . On supplemental oxygen therapy    03-06-2018 per pt uses only at night,  checks O2 sats at home,  stated am sat 89% after moving around average 93-94% with RA  . Osteoporosis   . Peripheral arterial occlusive disease [facility]) vascular-- dr chen/ dr Donzetta Matters   proximal right SFA severe focal stenosis with collaterals from the PFA/  04/ 2012 occluded celiac and SMA arteries with distal reconstitution w/ patent IMA  . Peripheral vascular disease ([place])    chronic DVT RLE,  mesenteric vascular disease  . Right lower lobe pulmonary nodule    Chest CT 08-29-2017  . Stage 3 severe COPD by GOLD classification ([place]) hx frequent exacerbations--   pulmologist-  dr Maryann Alar--  per lov note , dated 12-20-2017, oxyogen 2L is prescribed for use with exertion (but pt only uses mostly at night), O2 sats on RA run the 80s , this day sat 86% RA and with 2L O2 sat 92%  . Varicose veins of leg with swelling    varicose vein surgery - Dr. Aleda Grana  . Wears glasses   . Wears hearing aid in both ears     Tobacco History: Social History   Tobacco Use  Smoking Status Former Smoker  . Packs/day: 0.75  . Years: 50.00  . Pack years: 37.50  . Types: Cigarettes  . Last attempt to quit: 05/18/2016  . Years since quitting: 2.4  Smokeless Tobacco Never Used   Counseling given: Not Answered   Outpatient Medications Prior to Visit  Medication Sig Dispense Refill  . albuterol (PROVENTIL HFA;VENTOLIN HFA) 108 (90 Base) MCG/ACT inhaler Inhale 2 puffs into the lungs every 4 (four) hours as needed for wheezing. As a RESCUE medication 1 Inhaler 1  . amitriptyline (ELAVIL) 50 MG tablet TAKE 1 TABLET BY MOUTH AT BEDTIME 90 tablet 0  . aspirin 81 MG tablet Take 81 mg by mouth every evening.     Marland Kitchen atorvastatin (LIPITOR) 80 MG tablet One tablet daily 90 tablet 3  . cilostazol (PLETAL) 100 MG tablet TAKE 1 TABLET BY MOUTH TWICE A DAY 180 tablet 2  . docusate sodium (COLACE) 100 MG capsule Take 100 mg by mouth at  bedtime.    . fluticasone (FLONASE) 50 MCG/ACT nasal spray USE 2 SPRAYS IN BOTH NOSTRILS DAILY.--- twice daily 16 g 0  . Fluticasone-Umeclidin-Vilant (TRELEGY ELLIPTA) 100-62.5-25 MCG/INH AEPB Inhale 1 puff into the lungs daily. (Patient taking differently: Inhale 1 puff into the lungs every morning. ) 1 each 11  . guaiFENesin (MUCINEX) 600 MG 12 hr tablet Take 600 mg by mouth 2 (two) times daily as needed.     Marland Kitchen ipratropium-albuterol (DUONEB) 0.5-2.5 (3) MG/3ML SOLN USE 1 NEB 3 TIMES DAILY FOR 3 DAYS, THEN EVERY 6 HOURS AS NEEDED. 360 mL 0  . IRON PO Take 325 mg by mouth daily.     Marland Kitchen levothyroxine (SYNTHROID, LEVOTHROID) 112 MCG tablet TAKE 1 TABLET (112 MCG TOTAL) BY MOUTH  DAILY BEFORE BREAKFAST. 90 tablet 1  . LORazepam (ATIVAN) 2 MG tablet TAKE 1 TABLET BY MOUTH AT BEDTIME AS NEEDED FOR ANXIETY 30 tablet 1  . omeprazole (PRILOSEC) 40 MG capsule Take 1 capsule (40 mg total) by mouth 2 (two) times daily. 180 capsule 1  . roflumilast (DALIRESP) 500 MCG TABS tablet Take 1 tablet (500 mcg total) by mouth daily. 30 tablet 11  . traZODone (DESYREL) 50 MG tablet TAKE 1/2-1 TABLET BY MOUTH AT BEDTIME AS NEEDED FOR SLEEP. 90 tablet 1  . budesonide-formoterol (SYMBICORT) 160-4.5 MCG/ACT inhaler Inhale 2 puffs into the lungs 2 (two) times daily. 1 Inhaler 0  . budesonide-formoterol (SYMBICORT) 160-4.5 MCG/ACT inhaler Inhale 2 puffs into the lungs 2 (two) times daily. 1 Inhaler 6  . Tiotropium Bromide Monohydrate (SPIRIVA RESPIMAT) 2.5 MCG/ACT AERS Inhale 2 puffs into the lungs daily. 4 g 2  . ezetimibe (ZETIA) 10 MG tablet Take 1 tablet (10 mg total) by mouth daily. 30 tablet 9  . doxycycline (VIBRA-TABS) 100 MG tablet Take 1 tablet (100 mg total) by mouth 2 (two) times daily. (Patient not taking: Reported on 11/06/2018) 14 tablet 0  . predniSONE (DELTASONE) 10 MG tablet Take 4 tabs po daily x 2 days; then 3 tabs for 2 days; then 2 tabs for 2 days; then 1 tab for 2 days (Patient not taking: Reported on  11/06/2018) 20 tablet 0   No facility-administered medications prior to visit.     Review of Systems  Review of Systems  Constitutional: Negative.   HENT: Positive for congestion.   Respiratory: Positive for cough and shortness of breath.   Cardiovascular: Negative.     Physical Exam  BP 116/78   Pulse (!) 104   Ht 5\' 6"  (1.676 m)   Wt 141 lb 3.2 oz (64 kg)   SpO2 93%   BMI 22.79 kg/m  Physical Exam Constitutional:      General: She is not in acute distress.    Appearance: Normal appearance.  HENT:     Head: Normocephalic and atraumatic.     Mouth/Throat:     Mouth: Mucous membranes are moist.     Pharynx: Oropharynx is clear.  Eyes:     Extraocular Movements: Extraocular movements intact.     Pupils: Pupils are equal, round, and reactive to light.  Neck:     Musculoskeletal: Normal range of motion and neck supple.  Cardiovascular:     Rate and Rhythm: Normal rate and regular rhythm.  Pulmonary:     Breath sounds: Wheezing and rhonchi present.  Skin:    General: Skin is warm and dry.  Neurological:     General: No focal deficit present.     Mental Status: She is alert and oriented to person, place, and time. Mental status is at baseline.  Psychiatric:        Mood and Affect: Mood normal.        Behavior: Behavior normal.        Thought Content: Thought content normal.        Judgment: Judgment normal.      Lab Results:  CBC    Component Value Date/Time   WBC 6.8 03/11/2018 0900   RBC 5.23 (H) 03/11/2018 0900   HGB 16.3 (H) 03/11/2018 0900   HGB 13.5 08/27/2017 1051   HGB 13.7  1045   HCT 47.1 (H) 03/11/2018 0900   HCT 40.3 08/27/2017 1051   HCT 42.4  1045   PLT 347 03/11/2018 0900  PLT 480 (H) 08/27/2017 1051   MCV 90.1 03/11/2018 0900   MCV 87 08/27/2017 1051   MCV 87.8  1045   MCH 31.2 03/11/2018 0900   MCHC 34.6 03/11/2018 0900   RDW 14.6 03/11/2018 0900   RDW 17.2 (H) 08/27/2017 1051   RDW 14.9 (H)   1045   LYMPHSABS 1.2 11/14/2017 1137   LYMPHSABS 1.1  1045   MONOABS 0.5 11/14/2017 1137   MONOABS 0.7  1045   EOSABS 0.3 11/14/2017 1137   EOSABS 0.6 (H)  1045   BASOSABS 0.0 11/14/2017 1137   BASOSABS 0.0  1045    BMET    Component Value Date/Time   NA 135 04/02/2018 1033   NA 141 02/17/2015 1249   K 5.2 04/02/2018 1033   K 4.3 02/17/2015 1249   CL 97 04/02/2018 1033   CL 108 (H) 08/21/2012 1501   CO2 23 04/02/2018 1033   CO2 25 02/17/2015 1249   GLUCOSE 95 04/02/2018 1033   GLUCOSE 110 (H) 03/11/2018 0900   GLUCOSE 85 02/17/2015 1249   GLUCOSE 101 (H) 08/21/2012 1501   BUN 9 04/02/2018 1033   BUN 16.6 02/17/2015 1249   CREATININE 0.79 04/02/2018 1033   CREATININE 0.93 01/03/2018 1601   CREATININE 0.8 02/17/2015 1249   CALCIUM 10.0 04/02/2018 1033   CALCIUM 9.3 02/17/2015 1249   GFRNONAA 74 04/02/2018 1033   GFRAA 86 04/02/2018 1033    BNP    Component Value Date/Time   BNP 28.4 07/02/2016 1235    ProBNP No results found for: PROBNP  Imaging: No results found.   Assessment & Plan:   73 year old female, former smoker quit 5 months ago.  Past medical history significant for severe COPD with frequent exacerbations.  She has tried [employer] but states that it was too expensive and she did not tolerate it.  She tried Trelegy but again states that this is too expensive and stopped taking it daily since this summer. She also reports that it sustained her teeth. Plan was to stop Trelgy and trial Symbicort/Spiriva.   Seen today for follow-up of an acute exacerbation of her COPD symptoms. She completed one round of doxycyline and prednisone taper. Feels some better but not 100 %. Still have a very congested cough with thick white/yellow mucus. She has a great deal amount of trouble expectorating her sputum, this appears to be the main problem and causes her shortness of breath. She has been taking BOTH trelegy and  Symbicort/spiriva as well as her duonebs. Educated patient that this was not how we instructed her to take them and that she is doubling up on medication. She would like to stay with Trelegy and we will give her a coupon card and sample today. Repeat spirometry showed moderate obstruction. She needs a follow-up Chest CT for pulmonary nodules and COPD. She has failed flutter valve, I would be interested in getting her a therapy vest if she qualifies. We could also consider pulmonary rehab and re-trying Daliresp again (will need patient assistance).  Chronic respiratory failure with hypoxia (HCC) - Ambulatory oxygen saturation low 96-98% room air   COPD, frequent exacerbations (HCC) - Prolonged COPD exacerbation, needs additional abx coverage and prednisone taper - RX Augmentin 1 tab BID x 7 days - Continue to encourage mucinex twice daily and flutter device 4 times a day - Patient would prefer to stay on Trelegy inhaler (coupon card and sample given) - FU in 1-2 months    Solitary pulmonary nodule -  Needs repeat CT chest w/o contrast    Martyn Ehrich, NP 11/06/2018

## 2018-11-06 ENCOUNTER — Ambulatory Visit: Payer: Medicare Other | Admitting: Primary Care

## 2018-11-06 ENCOUNTER — Encounter: Payer: Self-pay | Admitting: Primary Care

## 2018-11-06 VITALS — BP 116/78 | HR 104 | Ht 66.0 in | Wt 141.2 lb

## 2018-11-06 DIAGNOSIS — J441 Chronic obstructive pulmonary disease with (acute) exacerbation: Secondary | ICD-10-CM

## 2018-11-06 DIAGNOSIS — R911 Solitary pulmonary nodule: Secondary | ICD-10-CM | POA: Diagnosis not present

## 2018-11-06 DIAGNOSIS — J9611 Chronic respiratory failure with hypoxia: Secondary | ICD-10-CM

## 2018-11-06 MED ORDER — PREDNISONE 10 MG PO TABS: ORAL_TABLET | ORAL | 0 refills | Status: DC

## 2018-11-06 MED ORDER — FLUTICASONE-UMECLIDIN-VILANT 100-62.5-25 MCG/INH IN AEPB: 1.0000 | INHALATION_SPRAY | Freq: Every day | RESPIRATORY_TRACT | 3 refills | Status: DC

## 2018-11-06 MED ORDER — FLUTICASONE-UMECLIDIN-VILANT 100-62.5-25 MCG/INH IN AEPB: 1.0000 | INHALATION_SPRAY | Freq: Every day | RESPIRATORY_TRACT | 0 refills | Status: DC

## 2018-11-06 MED ORDER — AMOXICILLIN-POT CLAVULANATE 875-125 MG PO TABS: 1.0000 | ORAL_TABLET | Freq: Two times a day (BID) | ORAL | 0 refills | Status: DC

## 2018-11-06 NOTE — Patient Instructions (Addendum)
Orders: CT chest without contrast re: COPD, pulmonary nodules  Office testing:  Spirometry showed moderate obstruction (COPD) Ambulatory O2 saturation was normal ( no need for oxygen)  Rx:  Augmentin 1 tab twice daily x 7 days  Prednisone taper as prescribed   Recommendations: Continue TRELEGY 1 puff daily (do not use Symbicort or Spiriva with this) Continue Duoneb three times a day as needed for shortness or breath/wheezing  Continue mucinex twice a day with full glass of water Continue to use flutter valve 4 times a day   Follow up: 1-2 months with Eustaquio Maize NP

## 2018-11-06 NOTE — Assessment & Plan Note (Signed)
-   Needs repeat CT chest w/o contrast

## 2018-11-06 NOTE — Assessment & Plan Note (Signed)
-   Ambulatory oxygen saturation low 96-98% room air

## 2018-11-06 NOTE — Assessment & Plan Note (Deleted)
-   Prolonged COPD exacerbation, needs additional abx coverage and prednisone taper - RX Augmentin 1 tab BID x 7 days - Continue to encourage mucinex twice daily and flutter device 4 times a day

## 2018-11-06 NOTE — Assessment & Plan Note (Signed)
-   Prolonged COPD exacerbation, needs additional abx coverage and prednisone taper - RX Augmentin 1 tab BID x 7 days - Continue to encourage mucinex twice daily and flutter device 4 times a day - Patient would prefer to stay on Trelegy inhaler (coupon card and sample given) - FU in 1-2 months

## 2018-11-12 ENCOUNTER — Other Ambulatory Visit: Payer: Self-pay | Admitting: Family Medicine

## 2018-11-17 ENCOUNTER — Telehealth: Payer: Self-pay | Admitting: Oncology

## 2018-11-17 ENCOUNTER — Ambulatory Visit (INDEPENDENT_AMBULATORY_CARE_PROVIDER_SITE_OTHER)
Admission: RE | Admit: 2018-11-17 | Discharge: 2018-11-17 | Disposition: A | Discharge status: Home / Self Care | Payer: Medicare Other | Source: Ambulatory Visit | Attending: Primary Care | Admitting: Primary Care

## 2018-11-17 DIAGNOSIS — R911 Solitary pulmonary nodule: Secondary | ICD-10-CM | POA: Diagnosis not present

## 2018-11-17 DIAGNOSIS — C672 Malignant neoplasm of lateral wall of bladder: Secondary | ICD-10-CM | POA: Diagnosis not present

## 2018-11-17 NOTE — Telephone Encounter (Signed)
Per LC last patient 2/6 1:30 pm.  Per LC moved LTS visit earlier that day. Spoke with patient.

## 2018-11-18 ENCOUNTER — Ambulatory Visit: Payer: Medicare Other | Admitting: Primary Care

## 2018-11-20 ENCOUNTER — Inpatient Hospital Stay: Payer: Medicare Other | Attending: Adult Health | Admitting: Adult Health

## 2018-11-20 ENCOUNTER — Inpatient Hospital Stay: Payer: Medicare Other

## 2018-11-20 ENCOUNTER — Telehealth: Payer: Self-pay | Admitting: Adult Health

## 2018-11-20 ENCOUNTER — Encounter: Payer: Self-pay | Admitting: Adult Health

## 2018-11-20 VITALS — BP 135/93 | HR 95 | Temp 97.8°F | Resp 18 | Ht 66.0 in | Wt 145.9 lb

## 2018-11-20 DIAGNOSIS — C50911 Malignant neoplasm of unspecified site of right female breast: Secondary | ICD-10-CM

## 2018-11-20 DIAGNOSIS — Z170 Estrogen receptor positive status [ER+]: Principal | ICD-10-CM

## 2018-11-20 DIAGNOSIS — C679 Malignant neoplasm of bladder, unspecified: Secondary | ICD-10-CM | POA: Insufficient documentation

## 2018-11-20 DIAGNOSIS — D473 Essential (hemorrhagic) thrombocythemia: Secondary | ICD-10-CM | POA: Insufficient documentation

## 2018-11-20 DIAGNOSIS — Z853 Personal history of malignant neoplasm of breast: Secondary | ICD-10-CM | POA: Diagnosis not present

## 2018-11-20 LAB — IRON AND TIBC
Iron: 52 ug/dL (ref 41–142)
Saturation Ratios: 17 % — ABNORMAL LOW (ref 21–57)
TIBC: 309 ug/dL (ref 236–444)
UIBC: 257 ug/dL (ref 120–384)

## 2018-11-20 LAB — C-REACTIVE PROTEIN: CRP: 4.4 mg/dL — ABNORMAL HIGH (ref <1.0)

## 2018-11-20 LAB — CMP (CANCER CENTER ONLY)
ALT: 41 U/L (ref 0–44)
AST: 34 U/L (ref 15–41)
Albumin: 3.9 g/dL (ref 3.5–5.0)
Alkaline Phosphatase: 86 U/L (ref 38–126)
Anion gap: 10 (ref 5–15)
BUN: 13 mg/dL (ref 8–23)
CO2: 26 mmol/L (ref 22–32)
Calcium: 9.3 mg/dL (ref 8.9–10.3)
Chloride: 102 mmol/L (ref 98–111)
Creatinine: 0.85 mg/dL (ref 0.44–1.00)
GFR, Est AFR Am: >60 mL/min (ref >60)
GFR, Estimated: >60 mL/min (ref >60)
Glucose, Bld: 90 mg/dL (ref 70–99)
Potassium: 4.3 mmol/L (ref 3.5–5.1)
Sodium: 138 mmol/L (ref 135–145)
Total Bilirubin: 0.7 mg/dL (ref 0.3–1.2)
Total Protein: 7.5 g/dL (ref 6.5–8.1)

## 2018-11-20 LAB — CBC WITH DIFFERENTIAL (CANCER CENTER ONLY)
Abs Immature Granulocytes: 0.08 10*3/uL — ABNORMAL HIGH (ref 0.00–0.07)
Basophils Absolute: 0.1 10*3/uL (ref 0.0–0.1)
Basophils Relative: 1 %
Eosinophils Absolute: 0.3 10*3/uL (ref 0.0–0.5)
Eosinophils Relative: 2 %
HCT: 48.1 % — ABNORMAL HIGH (ref 36.0–46.0)
Hemoglobin: 15 g/dL (ref 12.0–15.0)
Immature Granulocytes: 1 %
Lymphocytes Relative: 9 %
Lymphs Abs: 0.9 10*3/uL (ref 0.7–4.0)
MCH: 30.2 pg (ref 26.0–34.0)
MCHC: 31.2 g/dL (ref 30.0–36.0)
MCV: 96.8 fL (ref 80.0–100.0)
Monocytes Absolute: 0.9 10*3/uL (ref 0.1–1.0)
Monocytes Relative: 9 %
Neutro Abs: 8.5 10*3/uL — ABNORMAL HIGH (ref 1.7–7.7)
Neutrophils Relative %: 78 %
Platelet Count: 291 10*3/uL (ref 150–400)
RBC: 4.97 MIL/uL (ref 3.87–5.11)
RDW: 13.7 % (ref 11.5–15.5)
WBC Count: 10.7 10*3/uL — ABNORMAL HIGH (ref 4.0–10.5)
nRBC: 0 % (ref 0.0–0.2)

## 2018-11-20 LAB — FERRITIN: Ferritin: 129 ng/mL (ref 11–307)

## 2018-11-20 NOTE — Progress Notes (Signed)
CLINIC:  Survivorship   REASON FOR VISIT:  Routine follow-up for history of breast cancer.   BRIEF ONCOLOGIC HISTORY:  Per Dr. Jana Hakim note from 06/21/2017:  [name] woman: history of right breast cancer: pT1c pN0, stage IA, estrogen and progesterone receptor positive.  1. S/p right mastectomy with myocutaneous flap 09/19/2009  2. Adjuvant chemo with 4 cycles of  Taxotere/  Cytoxan  3. Femara, followed by Arimidex, started roughly Jan 2011, with a few months' interruption  4.  Completed 5 years of antiestrogen therapy June 2016  5.  Also s/p left breast lumpectomy in may 2014 ,negative for atypia or malignancy  6. Intermittent thrombocytosis, mild likely multifocal factorial (borderline iron deficiency, positive ANA and elevated C-reactive protein)   INTERVAL HISTORY:  [name] presents to the [facility]orship [facility] today for routine follow-up for her history of breast cancer.  Overall, she reports feeling quite well. She was diagnosed with bladder cancer last year.  She is very worried about this, and about having a recurrence.  Since her last visit she underwent left screening mammogram with tomosynthesis, it was negative for malignancy and showed breast density category C.   [name] is not exercising as much during the winter.  She notes she is trying to move more.      REVIEW OF SYSTEMS:  Review of Systems  Constitutional: Negative for appetite change, chills, fatigue, fever and unexpected weight change.  HENT:   Negative for hearing loss, lump/mass, mouth sores, sore throat and trouble swallowing.   Eyes: Negative for eye problems and icterus.  Respiratory: Negative for chest tightness, cough and shortness of breath.   Cardiovascular: Negative for chest pain, leg swelling and palpitations.  Gastrointestinal: Negative for abdominal distention, abdominal pain, constipation, diarrhea, nausea and vomiting.  Endocrine: Negative for hot flashes.  Musculoskeletal: Negative  for arthralgias.  Skin: Negative for itching and rash.  Neurological: Negative for dizziness and numbness.  Hematological: Negative for adenopathy. Does not bruise/bleed easily.  Psychiatric/Behavioral: Negative for depression. The patient is nervous/anxious.    Breast: Denies any new nodularity, masses, tenderness, nipple changes, or nipple discharge.       PAST MEDICAL/SURGICAL HISTORY:  Past Medical History:  Diagnosis Date  . Bilateral leg cramps   . Bladder neoplasm   . Chronic deep vein thrombosis (DVT) of right lower extremity ([place])    05/ 2018  chronic non-occlusive DVT RLE  . COPD, frequent exacerbations ([place])    03-06-2018 per pt last exacerbation 12/ 2018  . Coronary artery disease    cardiologist-  dr Aundra Dubin-- 03-11-2018 er cath , 80% stenosis in the ostial second diagonal  . Dyspnea on minimal exertion   . Emphysema/COPD ([place])    CAT score- 17  . Fibromyalgia 1995  . GERD (gastroesophageal reflux disease)   . Hiatal hernia   . History of diverticulitis of colon    2002  s/p  sigmoid colectomy  . History of multiple pulmonary nodules    hx RUL nodules x2  s/p right VATS w/ wedge resection 09-11-2005 and 06-14-2011  both  necrotizing granulomatous inflammation w/ cystic area of necrosis & focal calcification  . History of right breast cancer oncologist-  dr Jana Hakim-- no recurrence   dx 2010--  IDC, Stage IA , ER/PR+,  (pT1c pN0) 11-09-2008 right lumpectomy;  12-18-2008 right simple mastectomy for DCIS margins;  completed chemotherapy 2010 (no radiation) and completed antiestrogen therapy  . Hx of colonic polyps    Dr. Cristina Gong -last study '11  .  Hyperlipidemia   . Hypertension   . Hypothyroidism   . 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 Nocturia   . OA (osteoarthritis)    thumbs  . On supplemental oxygen therapy    03-06-2018 per pt uses only at night,  checks O2 sats at home,  stated am sat 89% after moving around average  93-94% with RA  . Osteoporosis   . Peripheral arterial occlusive disease [facility]) vascular-- dr chen/ dr Donzetta Matters   proximal right SFA severe focal stenosis with collaterals from the PFA/  04/ 2012 occluded celiac and SMA arteries with distal reconstitution w/ patent IMA  . Peripheral vascular disease ([place])    chronic DVT RLE,  mesenteric vascular disease  . Right lower lobe pulmonary nodule    Chest CT 08-29-2017  . Stage 3 severe COPD by GOLD classification ([place]) hx frequent exacerbations--   pulmologist-  dr Maryann Alar--  per lov note , dated 12-20-2017, oxyogen 2L is prescribed for use with exertion (but pt only uses mostly at night), O2 sats on RA run the 80s , this day sat 86% RA and with 2L O2 sat 92%  . Varicose veins of leg with swelling    varicose vein surgery - Dr. Aleda Grana  . Wears glasses   . Wears hearing aid in both ears    Past Surgical History:  Procedure Laterality Date  . BREAST EXCISIONAL BIOPSY Left 10/15/2008  . BREAST LUMPECTOMY W/ NEEDLE LOCALIZATION Right 11-09-2008    dr Brantley Stage   [facility]  . CARDIAC CATHETERIZATION  03-12-2011   dr Aundra Dubin   70-80% ostial stenosis in small second diagonal (appears to small for intervention),  dLAD 40-50%,  minimal luminal irregulartieis involoving LCFx and RCA,  LVEF 55%  . CATARACT EXTRACTION W/ INTRAOCULAR LENS  IMPLANT, BILATERAL  2011  . CYSTOSCOPY N/A 03/11/2018   Procedure: CYSTOSCOPY WITH INSTILLATION OF POST OPERATIVE EPIRUBICIN;  Surgeon: Festus Aloe, MD;  Location: [facility];  Service: Urology;  Laterality: N/A;  . MASTECTOMY Right   . PARTIAL COLECTOMY  2002   sigmoid and appectomy (diverticulitis)  . PARTIAL MASTECTOMY WITH NEEDLE LOCALIZATION Left 02/23/2013   Procedure:  LEFT PARTIAL MASTECTOMY WITH NEEDLE LOCALIZATION;  Surgeon: Adin Hector, MD;  Location: [place];  Service: General;  Laterality: Left;  . RECONSTRUCTION BREAST W/ LATISSIMUS DORSI FLAP Right 05-30-2009   dr Harlow Mares  [facility]  .  REDUCTION MAMMAPLASTY Left   . SIMPLE MASTECTOMY Right 12-28-2008    dr Brantley Stage  [facility]  . TRANSTHORACIC ECHOCARDIOGRAM  07-09-2016  dr Aundra Dubin   ef 55-60%, grade 1 diastoic dysfunction/  mild TR  . TRANSURETHRAL RESECTION OF BLADDER TUMOR N/A 03/11/2018   Procedure: TRANSURETHRAL RESECTION OF BLADDER TUMOR (TURBT) 2-5cm;  Surgeon: Festus Aloe, MD;  Location: [facility];  Service: Urology;  Laterality: N/A;  . VAGINAL HYSTERECTOMY  1973  . VIDEO ASSISTED THORACOSCOPY (VATS)/WEDGE RESECTION Right 09-11-2005  &  06-14-2011   dr hendrickso  [facility]   both RUL     ALLERGIES:  Allergies  Allergen Reactions  . Sulfa Antibiotics Swelling     CURRENT MEDICATIONS:  Outpatient Encounter Medications as of 11/20/2018  Medication Sig Note  . albuterol (PROVENTIL HFA;VENTOLIN HFA) 108 (90 Base) MCG/ACT inhaler Inhale 2 puffs into the lungs every 4 (four) hours as needed for wheezing. As a RESCUE medication   . amitriptyline (ELAVIL) 50 MG tablet TAKE 1 TABLET BY MOUTH AT BEDTIME   . aspirin  81 MG tablet Take 81 mg by mouth every evening.    Marland Kitchen atorvastatin (LIPITOR) 80 MG tablet One tablet daily   . cilostazol (PLETAL) 100 MG tablet TAKE 1 TABLET BY MOUTH TWICE A DAY   . docusate sodium (COLACE) 100 MG capsule Take 100 mg by mouth at bedtime.   . fluticasone (FLONASE) 50 MCG/ACT nasal spray USE 2 SPRAYS IN BOTH NOSTRILS DAILY.--- TWICE DAILY   . Fluticasone-Umeclidin-Vilant (TRELEGY ELLIPTA) 100-62.5-25 MCG/INH AEPB Inhale 1 puff into the lungs daily. (Patient taking differently: Inhale 1 puff into the lungs every morning. )   . Fluticasone-Umeclidin-Vilant (TRELEGY ELLIPTA) 100-62.5-25 MCG/INH AEPB Inhale 1 puff into the lungs daily.   Marland Kitchen guaiFENesin (MUCINEX) 600 MG 12 hr tablet Take 600 mg by mouth 2 (two) times daily as needed.    Marland Kitchen ipratropium-albuterol (DUONEB) 0.5-2.5 (3) MG/3ML SOLN USE 1 NEB 3 TIMES DAILY FOR 3 DAYS, THEN EVERY 6 HOURS AS NEEDED.   Marland Kitchen levothyroxine (SYNTHROID, LEVOTHROID) 112 MCG  tablet TAKE 1 TABLET (112 MCG TOTAL) BY MOUTH DAILY BEFORE BREAKFAST.   Marland Kitchen LORazepam (ATIVAN) 2 MG tablet TAKE 1 TABLET BY MOUTH AT BEDTIME AS NEEDED FOR ANXIETY   . omeprazole (PRILOSEC) 40 MG capsule Take 1 capsule (40 mg total) by mouth 2 (two) times daily.   . roflumilast (DALIRESP) 500 MCG TABS tablet Take 1 tablet (500 mcg total) by mouth daily. 03/11/2018: Pt is not taking due to cost    . traZODone (DESYREL) 50 MG tablet TAKE 1/2-1 TABLET BY MOUTH AT BEDTIME AS NEEDED FOR SLEEP.   Marland Kitchen ezetimibe (ZETIA) 10 MG tablet Take 1 tablet (10 mg total) by mouth daily.   . [DISCONTINUED] amoxicillin-clavulanate (AUGMENTIN) 875-125 MG tablet Take 1 tablet by mouth 2 (two) times daily. (Patient not taking: Reported on 11/20/2018)   . [DISCONTINUED] IRON PO Take 325 mg by mouth daily.    . [DISCONTINUED] predniSONE (DELTASONE) 10 MG tablet Take 4 tabs po daily x 3 days; then 3 tabs daily x3 days; then 2 tabs daily x3 days; then 1 tab daily x 3 days; then stop (Patient not taking: Reported on 11/20/2018)    No facility-administered encounter medications on file as of 11/20/2018.      ONCOLOGIC FAMILY HISTORY:  Family History  Problem Relation Age of Onset  . Colon cancer Mother        colon  . COPD Mother        brown lung  . Hypertension Mother   . Diabetes Mother   . Emphysema Mother   . Coronary artery disease Father   . Heart attack Father   . Sudden death Father   . Heart disease Father   . Cancer Sister        fallopian tube  . Hypertension Sister   . Coronary artery disease Brother   . Throat cancer Sister   . Melanoma Sister   . Hypertension Sister   . Pancreatic cancer Sister   . Cancer Sister   . Heart attack Brother        early 32's  . Heart failure Sister     GENETIC COUNSELING/TESTING: Referral placed  SOCIAL HISTORY:  [name] is married and lives with her husband in [place], [place].  She has 2 children who live one with her, and one a few miles away.  Ms.  [name] is currently retired.  She denies any current or history of tobacco, alcohol, or illicit drug use.  (reviewed 11/20/2018--no changes)  PHYSICAL EXAMINATION:  Vital Signs: Vitals:   11/20/18 1258  BP: (!) 135/93  Pulse: 95  Resp: 18  Temp: 97.8 F (36.6 C)  SpO2: 95%   Filed Weights   11/20/18 1258  Weight: 145 lb 14.4 oz (66.2 kg)   General: Well-nourished, well-appearing female in no acute distress.  Unaccompanied today.   HEENT: Head is normocephalic.  Pupils equal and reactive to light. Conjunctivae clear without exudate.  Sclerae anicteric. Oral mucosa is pink, moist.  Oropharynx is pink without lesions or erythema.  Lymph: No cervical, supraclavicular, or infraclavicular lymphadenopathy noted on palpation.  Cardiovascular: Regular rate and rhythm.Marland Kitchen Respiratory: Clear to auscultation bilaterally. Chest expansion symmetric; breathing non-labored.  Breast Exam:  -Left breast: No appreciable masses on palpation. No skin redness, thickening, or peau d'orange appearance; no nipple retraction or nipple discharge; mild distortion in symmetry at previous lumpectomy site well healed scar without erythema or nodularity.  -Right breast: s/p mastecomy and reconstruction, well healed, no swelling or sign of recurrence. -Axilla: No axillary adenopathy bilaterally.  GI: Abdomen soft and round; non-tender, non-distended. Bowel sounds normoactive. No hepatosplenomegaly.   GU: Deferred.  Neuro: No focal deficits. Steady gait.  Psych: Mood and affect normal and appropriate for situation.  MSK: No focal spinal tenderness to palpation, full range of motion in bilateral upper extremities Extremities: No edema. Skin: Warm and dry.  LABORATORY DATA:  None for this visit   DIAGNOSTIC IMAGING:      ASSESSMENT AND PLAN:  [name] is a pleasant 73 y.o. female with history of Stage IA right breast invasive ductal carcinoma, ER+/PR+/HER2-, diagnosed in 2010, treated with mastectomy,  adjuvant chemotherapy, Femara/Arimidex x 5 years completed in 03/2015.  Of note she is s/p left lumpectomy in 02/2013 neg for atypia or malignancy.  She also has intermittent thrombocytosis.  She presents to the Survivorship Clinic for surveillance and routine follow-up.   1. History of breast cancer:  [name] is currently clinically and radiographically without evidence of disease or recurrence of breast cancer.  She will undergo mammogram in 04/2019.  She also has new diagnosis of bladder cancer.  We have requested records from Dr. Mena Pauls office.  I sent her for genetic counseling appointment as well since she also has family history of different cancers.  She will return to see me in one year for repeat long term follow up.  I encouraged her to call me with any questions or concerns before her next visit at the cancer center, and I would be happy to see her sooner, if needed.    2. History of intermittent thrombocytosis: will recheck CBC, CRP, and iron levels today.    3. Bone health:  Given [name]'s age, history of breast cancer, and her previous anti-estrogen therapy with aromatase inhibitors, she is at risk for bone demineralization. I will defer to her PCP regarding bone density testing and management.  She was given education on specific food and activities to promote bone health.  4. Cancer screening:  Due to [name]'s history and her age, she should receive screening for skin cancers, colon cancer. She was encouraged to follow-up with her PCP for appropriate cancer screenings.   5. Health maintenance and wellness promotion: [name] was encouraged to consume 5-7 servings of fruits and vegetables per day. She was also encouraged to engage in moderate to vigorous exercise for 30 minutes per day most days of the week. She was instructed to limit her alcohol consumption and continue to abstain  from tobacco use.     Dispo:  -Return to cancer center in one year for LTS follow  up -Mammogram in 04/2019 -Genetic counseling referral   A total of (30) minutes of face-to-face time was spent with this patient with greater than 50% of that time in counseling and care-coordination.   Gardenia Phlegm, NP Survivorship Program [facility] [phone]   Note: PRIMARY CARE PROVIDER [place], [name], [place] [phone]

## 2018-11-20 NOTE — Telephone Encounter (Signed)
Gave avs and calendar ° °

## 2018-11-21 ENCOUNTER — Other Ambulatory Visit: Payer: Self-pay | Admitting: Physician Assistant

## 2018-11-21 ENCOUNTER — Ambulatory Visit
Admission: RE | Admit: 2018-11-21 | Discharge: 2018-11-21 | Disposition: A | Discharge status: Home / Self Care | Payer: Medicare Other | Source: Ambulatory Visit | Attending: Physician Assistant | Admitting: Physician Assistant

## 2018-11-21 DIAGNOSIS — R1907 Generalized intra-abdominal and pelvic swelling, mass and lump: Secondary | ICD-10-CM | POA: Diagnosis not present

## 2018-11-21 DIAGNOSIS — R110 Nausea: Secondary | ICD-10-CM | POA: Diagnosis not present

## 2018-11-21 DIAGNOSIS — K5904 Chronic idiopathic constipation: Secondary | ICD-10-CM | POA: Diagnosis not present

## 2018-11-21 DIAGNOSIS — R1031 Right lower quadrant pain: Secondary | ICD-10-CM

## 2018-11-21 DIAGNOSIS — R1032 Left lower quadrant pain: Secondary | ICD-10-CM | POA: Diagnosis not present

## 2018-11-22 DIAGNOSIS — J449 Chronic obstructive pulmonary disease, unspecified: Secondary | ICD-10-CM | POA: Diagnosis not present

## 2018-12-01 ENCOUNTER — Inpatient Hospital Stay (HOSPITAL_BASED_OUTPATIENT_CLINIC_OR_DEPARTMENT_OTHER): Payer: Medicare Other | Admitting: Genetic Counselor

## 2018-12-01 ENCOUNTER — Encounter: Payer: Self-pay | Admitting: Genetic Counselor

## 2018-12-01 ENCOUNTER — Inpatient Hospital Stay: Payer: Medicare Other

## 2018-12-01 DIAGNOSIS — Z808 Family history of malignant neoplasm of other organs or systems: Secondary | ICD-10-CM | POA: Diagnosis not present

## 2018-12-01 DIAGNOSIS — Z809 Family history of malignant neoplasm, unspecified: Secondary | ICD-10-CM | POA: Diagnosis not present

## 2018-12-01 DIAGNOSIS — D75839 Thrombocytosis, unspecified: Secondary | ICD-10-CM

## 2018-12-01 DIAGNOSIS — Z803 Family history of malignant neoplasm of breast: Secondary | ICD-10-CM | POA: Insufficient documentation

## 2018-12-01 DIAGNOSIS — D473 Essential (hemorrhagic) thrombocythemia: Secondary | ICD-10-CM

## 2018-12-01 DIAGNOSIS — Z315 Encounter for genetic counseling: Secondary | ICD-10-CM

## 2018-12-01 DIAGNOSIS — Z170 Estrogen receptor positive status [ER+]: Principal | ICD-10-CM

## 2018-12-01 DIAGNOSIS — C679 Malignant neoplasm of bladder, unspecified: Secondary | ICD-10-CM

## 2018-12-01 DIAGNOSIS — Z8041 Family history of malignant neoplasm of ovary: Secondary | ICD-10-CM | POA: Insufficient documentation

## 2018-12-01 DIAGNOSIS — Z853 Personal history of malignant neoplasm of breast: Secondary | ICD-10-CM | POA: Diagnosis not present

## 2018-12-01 DIAGNOSIS — Z800 Family history of malignant neoplasm of digestive organs: Secondary | ICD-10-CM | POA: Diagnosis not present

## 2018-12-01 DIAGNOSIS — C50911 Malignant neoplasm of unspecified site of right female breast: Secondary | ICD-10-CM

## 2018-12-01 LAB — COMPREHENSIVE METABOLIC PANEL
ALT: 19 U/L (ref 0–44)
AST: 17 U/L (ref 15–41)
Albumin: 4.1 g/dL (ref 3.5–5.0)
Alkaline Phosphatase: 97 U/L (ref 38–126)
Anion gap: 11 (ref 5–15)
BUN: 13 mg/dL (ref 8–23)
CO2: 26 mmol/L (ref 22–32)
Calcium: 9.9 mg/dL (ref 8.9–10.3)
Chloride: 101 mmol/L (ref 98–111)
Creatinine, Ser: 1.27 mg/dL — ABNORMAL HIGH (ref 0.44–1.00)
GFR calc Af Amer: 48 mL/min — ABNORMAL LOW (ref >60)
GFR calc non Af Amer: 42 mL/min — ABNORMAL LOW (ref >60)
Glucose, Bld: 74 mg/dL (ref 70–99)
Potassium: 4.2 mmol/L (ref 3.5–5.1)
Sodium: 138 mmol/L (ref 135–145)
Total Bilirubin: 0.5 mg/dL (ref 0.3–1.2)
Total Protein: 8.3 g/dL — ABNORMAL HIGH (ref 6.5–8.1)

## 2018-12-01 LAB — CBC WITH DIFFERENTIAL/PLATELET
Abs Immature Granulocytes: 0.02 10*3/uL (ref 0.00–0.07)
Basophils Absolute: 0.1 10*3/uL (ref 0.0–0.1)
Basophils Relative: 1 %
Eosinophils Absolute: 0.2 10*3/uL (ref 0.0–0.5)
Eosinophils Relative: 3 %
HCT: 46.3 % — ABNORMAL HIGH (ref 36.0–46.0)
Hemoglobin: 15.1 g/dL — ABNORMAL HIGH (ref 12.0–15.0)
Immature Granulocytes: 0 %
Lymphocytes Relative: 20 %
Lymphs Abs: 1.2 10*3/uL (ref 0.7–4.0)
MCH: 30.5 pg (ref 26.0–34.0)
MCHC: 32.6 g/dL (ref 30.0–36.0)
MCV: 93.5 fL (ref 80.0–100.0)
Monocytes Absolute: 0.7 10*3/uL (ref 0.1–1.0)
Monocytes Relative: 12 %
Neutro Abs: 3.7 10*3/uL (ref 1.7–7.7)
Neutrophils Relative %: 64 %
Platelets: 432 10*3/uL — ABNORMAL HIGH (ref 150–400)
RBC: 4.95 MIL/uL (ref 3.87–5.11)
RDW: 12.6 % (ref 11.5–15.5)
WBC: 5.8 10*3/uL (ref 4.0–10.5)
nRBC: 0 % (ref 0.0–0.2)

## 2018-12-01 LAB — C-REACTIVE PROTEIN: CRP: 1.5 mg/dL — ABNORMAL HIGH (ref <1.0)

## 2018-12-02 NOTE — Progress Notes (Signed)
REFERRING PROVIDER: Gardenia Phlegm, NP [place], [place] [address]  PRIMARY PROVIDER:  Martinique, [name], MD  PRIMARY REASON FOR VISIT:  1. Family history of breast cancer   2. Family history of ovarian cancer   3. Family history of pancreatic cancer   4. Family history of colon cancer   5. Family history of melanoma   6. History of breast cancer      HISTORY OF PRESENT ILLNESS:   [name], a 73 y.o. female, was seen for a [facility] cancer genetics consultation at the request of Dr. Delice Bison due to a personal and family history of cancer.  [name] presents to clinic today to discuss the possibility of a hereditary predisposition to cancer, genetic testing, and to further clarify her future cancer risks, as well as potential cancer risks for family members.   In 2010, at the age of 52, [name] was diagnosed with invasive ductal carcinoma of the right breast. She had a benign breast tumor in her left breast that was removed in 2014.  Last year, in 2019, [name] was diagnosed with bladder cancer.  She is concerned for her sons and family based on her personal and family history of cancer.    CANCER HISTORY:   No history exists.     HORMONAL RISK FACTORS:  Menarche was at age 14.  First live birth at age 3.  OCP use for approximately 2-3 years.  Ovaries intact: yes.  Hysterectomy: no.  Menopausal status: postmenopausal.  HRT use: 10+ years. Colonoscopy: yes; som polyps but unsure how many. Mammogram within the last year: yes. Number of breast biopsies: 2. Up to date with pelvic exams:  n/a. Any excessive radiation exposure in the past:  no  Past Medical History:  Diagnosis Date  . Bilateral leg cramps   . Bladder neoplasm   . Chronic deep vein thrombosis (DVT) of right lower extremity ([place])    05/ 2018  chronic non-occlusive DVT RLE  . COPD, frequent exacerbations ([place])    03-06-2018 per pt last exacerbation 12/ 2018  . Coronary  artery disease    cardiologist-  dr Aundra Dubin-- 03-11-2018 er cath , 80% stenosis in the ostial second diagonal  . Dyspnea on minimal exertion   . Emphysema/COPD ([place])    CAT score- 17  . Family history of breast cancer   . Family history of colon cancer   . Family history of melanoma   . Family history of ovarian cancer   . Family history of pancreatic cancer   . Fibromyalgia 1995  . GERD (gastroesophageal reflux disease)   . Hiatal hernia   . History of breast cancer   . History of diverticulitis of colon    2002  s/p  sigmoid colectomy  . History of multiple pulmonary nodules    hx RUL nodules x2  s/p right VATS w/ wedge resection 09-11-2005 and 06-14-2011  both  necrotizing granulomatous inflammation w/ cystic area of necrosis & focal calcification  . History of right breast cancer oncologist-  dr Jana Hakim-- no recurrence   dx 2010--  IDC, Stage IA , ER/PR+,  (pT1c pN0) 11-09-2008 right lumpectomy;  12-18-2008 right simple mastectomy for DCIS margins;  completed chemotherapy 2010 (no radiation) and completed antiestrogen therapy  . Hx of colonic polyps    Dr. Cristina Gong -last study '11  . Hyperlipidemia   . Hypertension   . Hypothyroidism   . 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 Nocturia   . OA (osteoarthritis)    thumbs  . On supplemental oxygen therapy    03-06-2018 per pt uses only at night,  checks O2 sats at home,  stated am sat 89% after moving around average 93-94% with RA  . Osteoporosis   . Peripheral arterial occlusive disease [facility]) vascular-- dr chen/ dr Donzetta Matters   proximal right SFA severe focal stenosis with collaterals from the PFA/  04/ 2012 occluded celiac and SMA arteries with distal reconstitution w/ patent IMA  . Peripheral vascular disease ([place])    chronic DVT RLE,  mesenteric vascular disease  . Right lower lobe pulmonary nodule    Chest CT 08-29-2017  . Stage 3 severe COPD by GOLD classification ([place]) hx frequent  exacerbations--   pulmologist-  dr Maryann Alar--  per lov note , dated 12-20-2017, oxyogen 2L is prescribed for use with exertion (but pt only uses mostly at night), O2 sats on RA run the 80s , this day sat 86% RA and with 2L O2 sat 92%  . Varicose veins of leg with swelling    varicose vein surgery - Dr. Aleda Grana  . Wears glasses   . Wears hearing aid in both ears     Past Surgical History:  Procedure Laterality Date  . BREAST EXCISIONAL BIOPSY Left 10/15/2008  . BREAST LUMPECTOMY W/ NEEDLE LOCALIZATION Right 11-09-2008    dr Brantley Stage   [facility]  . CARDIAC CATHETERIZATION  03-12-2011   dr Aundra Dubin   70-80% ostial stenosis in small second diagonal (appears to small for intervention),  dLAD 40-50%,  minimal luminal irregulartieis involoving LCFx and RCA,  LVEF 55%  . CATARACT EXTRACTION W/ INTRAOCULAR LENS  IMPLANT, BILATERAL  2011  . CYSTOSCOPY N/A 03/11/2018   Procedure: CYSTOSCOPY WITH INSTILLATION OF POST OPERATIVE EPIRUBICIN;  Surgeon: Festus Aloe, MD;  Location: [facility];  Service: Urology;  Laterality: N/A;  . MASTECTOMY Right   . PARTIAL COLECTOMY  2002   sigmoid and appectomy (diverticulitis)  . PARTIAL MASTECTOMY WITH NEEDLE LOCALIZATION Left 02/23/2013   Procedure:  LEFT PARTIAL MASTECTOMY WITH NEEDLE LOCALIZATION;  Surgeon: Adin Hector, MD;  Location: [facility];  Service: General;  Laterality: Left;  . RECONSTRUCTION BREAST W/ LATISSIMUS DORSI FLAP Right 05-30-2009   dr Harlow Mares  [facility]  . REDUCTION MAMMAPLASTY Left   . SIMPLE MASTECTOMY Right 12-28-2008    dr Brantley Stage  [facility]  . TRANSTHORACIC ECHOCARDIOGRAM  07-09-2016  dr Aundra Dubin   ef 55-60%, grade 1 diastoic dysfunction/  mild TR  . TRANSURETHRAL RESECTION OF BLADDER TUMOR N/A 03/11/2018   Procedure: TRANSURETHRAL RESECTION OF BLADDER TUMOR (TURBT) 2-5cm;  Surgeon: Festus Aloe, MD;  Location: [facility];  Service: Urology;  Laterality: N/A;  . VAGINAL HYSTERECTOMY  1973  . VIDEO ASSISTED THORACOSCOPY  (VATS)/WEDGE RESECTION Right 09-11-2005  &  06-14-2011   dr Fara Boros  [facility]   both RUL    Social History   Socioeconomic History  . Marital status: Married    Spouse name: Not on file  . Number of children: 3  . Years of education: 1  . Highest education level: Not on file  Occupational History  . 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 Financial resource strain: Not on file  . Food insecurity:    Worry: Not on file    Inability: Not on file  . Transportation needs:    Medical: Not on file  Non-medical: Not on file  Tobacco Use  . Smoking status: Former Smoker    Packs/day: 0.75    Years: 50.00    Pack years: 37.50    Types: Cigarettes    Last attempt to quit: 05/18/2016    Years since quitting: 2.5  . Smokeless tobacco: Never Used  Substance and Sexual Activity  . Alcohol use: Yes    Alcohol/week: 10.0 standard drinks    Types: 10 Cans of beer per week  . Drug use: No  . Sexual activity: Not on file  Lifestyle  . Physical activity:    Days per week: Not on file    Minutes per session: Not on file  . Stress: Not on file  Relationships  . Social connections:    Talks on phone: Not on file    Gets together: Not on file    Attends religious service: Not on file    Active member of club or organization: Not on file    Attends meetings of clubs or organizations: Not on file    Relationship status: Not on file  Other Topics Concern  . Not on file  Social History Narrative   HSG, beauty school. Married '69 -39 yrs/widowed; married '79 - 56yrdivorced; married '87.  2 sons - '70, '74, 1 srtep-son; 1 grandchild. Work - bInsurance claims handler40 yrs, retired. SO - good health.  NO history of abuse.  ACP - full code and all heroic measures.      FAMILY HISTORY:  We obtained a detailed, 4-generation family history.  Significant diagnoses are listed below: Family History  Problem Relation Age of Onset  . Colon cancer Mother        colon   . COPD Mother        brown lung  . Hypertension Mother   . Diabetes Mother   . Emphysema Mother   . Coronary artery disease Father   . Heart attack Father   . Sudden death Father   . Heart disease Father   . Cancer Sister        throat  . Hypertension Sister   . Coronary artery disease Brother   . Heart attack Brother        early 533s . Throat cancer Sister   . Ovarian cancer Sister        fallopian tube cancer in her 248s . Melanoma Sister   . Hypertension Sister   . Pancreatic cancer Sister   . Melanoma Niece        dx in her 475s . Breast cancer Niece 483   The patient has two sons who are cancer free.  She has four sisters and one brother.  One sister had pancreatic cancer at 646 another sister and her daughter both had melanoma, a sister died with throat cancer, and that sisters daughter also had throat cancer.  Her fourth sister was diagnosed with fallopian tube cancer in her 20's, and now has throat cancer.  The patient's brother has had a heart attack but is living.  His daughter was diagnosed with breast cancer at 467  Both parents are deceased.  The patient's mother had colon cancer at 680  She had three sister who did not have cancer.  The maternal grandparents are both deceased.  The patient's father died of heart disease at 587  He had several siblings who were not reported to have cancer.  Both paternal grandparents are deceased.  Ms. TGranlundis unaware of  previous family history of genetic testing for hereditary cancer risks. Patient's maternal ancestors are of Caucasian descent, and paternal ancestors are of Caucasian descent. There is no reported Ashkenazi Jewish ancestry. There is no known consanguinity.  GENETIC COUNSELING ASSESSMENT: REEVE MALLO is a 73 y.o. female with a personal and family history of cancer which is somewhat suggestive of a hereditary cancer syndrome and predisposition to cancer. We, therefore, discussed and recommended the following at  today's visit.   DISCUSSION: We discussed that about 5-10% of breast cancer is hereditary with most cases due to BRCA mutations.  There are other genes that can also increase the risk for breast cancer, as well as for the other cancers in her family.  Based on her sister and niece's diagnosis of melanoma, we will want to add on some of the melanoma cancer genes. While pancreatic cancer and melanoma can be associated with CDKN2A mutations, there are other melanoma genes that can increase the risk for that cancer as well.  We reviewed the characteristics, features and inheritance patterns of hereditary cancer syndromes. We also discussed genetic testing, including the appropriate family members to test, the process of testing, insurance coverage and turn-around-time for results. We discussed the implications of a negative, positive and/or variant of uncertain significant result. We recommended Ms. Lovena Le pursue genetic testing for the multi-cancer gene panel. The Multi-Gene Panel offered by [facility] includes sequencing and/or deletion duplication testing of the following 84 genes: AIP, ALK, APC, ATM, AXIN2,BAP1,  BARD1, BLM, BMPR1A, BRCA1, BRCA2, BRIP1, CASR, CDC73, CDH1, CDK4, CDKN1B, CDKN1C, CDKN2A (p14ARF), CDKN2A (p16INK4a), CEBPA, CHEK2, CTNNA1, DICER1, DIS3L2, EGFR (c.2369C>T, p.Thr790Met variant only), EPCAM (Deletion/duplication testing only), FH, FLCN, GATA2, GPC3, GREM1 (Promoter region deletion/duplication testing only), HOXB13 (c.251G>A, p.Gly84Glu), HRAS, KIT, MAX, MEN1, MET, MITF (c.952G>A, p.Glu318Lys variant only), MLH1, MSH2, MSH3, MSH6, MUTYH, NBN, NF1, NF2, NTHL1, PALB2, PDGFRA, PHOX2B, PMS2, POLD1, POLE, POT1, PRKAR1A, PTCH1, PTEN, RAD50, RAD51C, RAD51D, RB1, RECQL4, RET, RUNX1, SDHAF2, SDHA (sequence changes only), SDHB, SDHC, SDHD, SMAD4, SMARCA4, SMARCB1, SMARCE1, STK11, SUFU, TERC, TERT, TMEM127, TP53, TSC1, TSC2, VHL, WRN and WT1.    Based on Ms. Marasco's personal and family history of  cancer, she meets medical criteria for genetic testing. Despite that she meets criteria, she may still have an out of pocket cost. We discussed that if her out of pocket cost for testing is over $100, the laboratory will call and confirm whether she wants to proceed with testing.  If the out of pocket cost of testing is less than $100 she will be billed by the genetic testing laboratory.   PLAN: After considering the risks, benefits, and limitations, Ms. Lovena Le  provided informed consent to pursue genetic testing and the blood sample was sent to [facility] for analysis of the multi-cancer gene panel. Results should be available within approximately 2-3 weeks' time, at which point they will be disclosed by telephone to Ms. Mahn, as will any additional recommendations warranted by these results. Ms. Sandell will receive a summary of her genetic counseling visit and a copy of her results once available. This information will also be available in Epic. We encouraged Ms. Milkovich to remain in contact with cancer genetics annually so that we can continuously update the family history and inform her of any changes in cancer genetics and testing that may be of benefit for her family. Ms. Riesgo questions were answered to her satisfaction today. Our contact information was provided should additional questions or concerns arise.  Lastly, we encouraged  Ms. Dickison to remain in contact with cancer genetics annually so that we can continuously update the family history and inform her of any changes in cancer genetics and testing that may be of benefit for this family.   Ms.  Fern questions were answered to her satisfaction today. Our contact information was provided should additional questions or concerns arise. Thank you for the referral and allowing [place] to share in the care of your patient.    P. Florene Glen, [place], [facility] Certified Genetic Counselor [email or web address] phone: [phone]  The  patient was seen for a total of 58 minutes in face-to-face genetic counseling.  This patient was discussed with Drs. Magrinat, Lindi Adie and/or Burr Medico who agrees with the above.    _______________________________________________________________________ For Office Staff:  Number of people involved in session: 1 Was an Intern/ student involved with case: no

## 2018-12-05 DIAGNOSIS — K5904 Chronic idiopathic constipation: Secondary | ICD-10-CM | POA: Diagnosis not present

## 2018-12-08 ENCOUNTER — Other Ambulatory Visit: Payer: Self-pay | Admitting: Family Medicine

## 2018-12-08 ENCOUNTER — Telehealth: Payer: Self-pay | Admitting: Family Medicine

## 2018-12-08 NOTE — Telephone Encounter (Signed)
Message sent to Dr. Jordan for review. 

## 2018-12-08 NOTE — Telephone Encounter (Signed)
Copied from [place] [id number]. Topic: Quick Communication - See Telephone Encounter >> Dec 08, 2018  2:03 PM Margot Ables wrote: CRM for notification. See Telephone encounter for: 12/08/18.   Sonia Baller RN with [facility] - pt is in a program that monitors BP/O2 sat/Pulse, etc and often in the morning pts O2 sat is 88-90. [employer] has high alert for O2 sat 90 or lower and medium alert for O2 sat 94 or lower. They are ending up calling pt almost every morning. Pt reports that sometimes the cannula comes out of her nose but it goes right back up in to the 90s once she puts it back. They are requesting adjustment on the High alert maybe to O2 sat below 88%. Please advise. Ok to leave on secure VM ph# [phone] x [phone].

## 2018-12-09 NOTE — Telephone Encounter (Signed)
Alarm can be set up to alert if O2 sat less than 88.  Thanks, BJ

## 2018-12-10 NOTE — Telephone Encounter (Signed)
Spoke with Sonia Baller RN with [facility], aware of recommendations per Dr Martinique. Nothing further needed.

## 2018-12-11 ENCOUNTER — Other Ambulatory Visit: Payer: Self-pay | Admitting: Family Medicine

## 2018-12-12 ENCOUNTER — Telehealth: Payer: Self-pay | Admitting: Genetic Counselor

## 2018-12-12 ENCOUNTER — Ambulatory Visit: Payer: Self-pay | Admitting: Genetic Counselor

## 2018-12-12 ENCOUNTER — Encounter: Payer: Self-pay | Admitting: Genetic Counselor

## 2018-12-12 DIAGNOSIS — Z1379 Encounter for other screening for genetic and chromosomal anomalies: Secondary | ICD-10-CM

## 2018-12-12 NOTE — Telephone Encounter (Signed)
Revealed negative genetic testing.  Discussed that we do not know why she has breast or bladder cancer or why there is cancer in the family. It could be due to a different gene that we are not testing, or maybe our current technology may not be able to pick something up.  It will be important for her to keep in contact with genetics to keep up with whether additional testing may be needed.  Revealed two VUS.  These will not change medical management.

## 2018-12-12 NOTE — Progress Notes (Signed)
HPI:  [name] was previously seen in the [facility] clinic due to a personal and family history of cancer and concerns regarding a hereditary predisposition to cancer. Please refer to our prior cancer genetics clinic note for more information regarding [name]'s medical, social and family histories, and our assessment and recommendations, at the time. [name] recent genetic test results were disclosed to her, as were recommendations warranted by these results. These results and recommendations are discussed in more detail below.  CANCER HISTORY:   No history exists.    FAMILY HISTORY:  We obtained a detailed, 4-generation family history.  Significant diagnoses are listed below: Family History  Problem Relation Age of Onset  . Colon cancer Mother        colon  . COPD Mother        brown lung  . Hypertension Mother   . Diabetes Mother   . Emphysema Mother   . Coronary artery disease Father   . Heart attack Father   . Sudden death Father   . Heart disease Father   . Cancer Sister        throat  . Hypertension Sister   . Coronary artery disease Brother   . Heart attack Brother        early 26s  . Throat cancer Sister   . Ovarian cancer Sister        fallopian tube cancer in her 65s  . Melanoma Sister   . Hypertension Sister   . Pancreatic cancer Sister   . Melanoma Niece        dx in her 105s  . Breast cancer Niece 94    The patient has two sons who are cancer free.  She has four sisters and one brother.  One sister had pancreatic cancer at 88, another sister and her daughter both had melanoma, a sister died with throat cancer, and that sisters daughter also had throat cancer.  Her fourth sister was diagnosed with fallopian tube cancer in her 20's, and now has throat cancer.  The patient's brother has had a heart attack but is living.  His daughter was diagnosed with breast cancer at 8.  Both parents are deceased.  The patient's mother had colon cancer  at 28.  She had three sister who did not have cancer.  The maternal grandparents are both deceased.  The patient's father died of heart disease at 10.  He had several siblings who were not reported to have cancer.  Both paternal grandparents are deceased.  [name] is unaware of previous family history of genetic testing for hereditary cancer risks. Patient's maternal ancestors are of Caucasian descent, and paternal ancestors are of Caucasian descent. There is no reported Ashkenazi Jewish ancestry. There is no known consanguinity.  GENETIC TEST RESULTS: Genetic testing reported out on December 09, 2018 through the multi-cancer panel found no deleterious mutations.  The Multi-Gene Panel offered by [facility] includes sequencing and/or deletion duplication testing of the following 84 genes: AIP, ALK, APC, ATM, AXIN2,BAP1,  BARD1, BLM, BMPR1A, BRCA1, BRCA2, BRIP1, CASR, CDC73, CDH1, CDK4, CDKN1B, CDKN1C, CDKN2A (p14ARF), CDKN2A (p16INK4a), CEBPA, CHEK2, CTNNA1, DICER1, DIS3L2, EGFR (c.2369C>T, p.Thr790Met variant only), EPCAM (Deletion/duplication testing only), FH, FLCN, GATA2, GPC3, GREM1 (Promoter region deletion/duplication testing only), HOXB13 (c.251G>A, p.Gly84Glu), HRAS, KIT, MAX, MEN1, MET, MITF (c.952G>A, p.Glu318Lys variant only), MLH1, MSH2, MSH3, MSH6, MUTYH, NBN, NF1, NF2, NTHL1, PALB2, PDGFRA, PHOX2B, PMS2, POLD1, POLE, POT1, PRKAR1A, PTCH1, PTEN, RAD50, RAD51C, RAD51D, RB1, RECQL4, RET,  RUNX1, SDHAF2, SDHA (sequence changes only), SDHB, SDHC, SDHD, SMAD4, SMARCA4, SMARCB1, SMARCE1, STK11, SUFU, TERC, TERT, TMEM127, TP53, TSC1, TSC2, VHL, WRN and WT1.   The test report has been scanned into EPIC and is located under the Molecular Pathology section of the Results Review tab.    We discussed with [name] that since the current genetic testing is not perfect, it is possible there may be a gene mutation in one of these genes that current testing cannot detect, but that chance is small.  We also  discussed, that it is possible that another gene that has not yet been discovered, or that we have not yet tested, is responsible for the cancer diagnoses in the family, and it is, therefore, important to remain in touch with cancer genetics in the future so that we can continue to offer [name] the most up to date genetic testing.   Genetic testing did detect two Variants of Unknown Significance - one in the AIP gene called [phone] and the other in the NF1 gene called c.4058C>T. At this time, it is unknown if these variants are associated with increased cancer risk or if they are a normal finding, but most variants such as these get reclassified to being inconsequential. They should not be used to make medical management decisions. With time, we suspect the lab will determine the significance of these variants, if any. If we do learn more about them, we will try to contact [name] to discuss it further. However, it is important to stay in touch with [place] periodically and keep the address and phone number up to date.    CANCER SCREENING RECOMMENDATIONS:  This result is reassuring and indicates that [name] likely does not have an increased risk for a future cancer due to a mutation in one of these genes. This normal test also suggests that [name]'s cancer was most likely not due to an inherited predisposition associated with one of these genes.  Most cancers happen by chance and this negative test suggests that her cancer falls into this category.  We, therefore, recommended she continue to follow the cancer management and screening guidelines provided by her oncology and primary healthcare provider.   An individual's cancer risk and medical management are not determined by genetic test results alone. Overall cancer risk assessment incorporates additional factors, including personal medical history, family history, and any available genetic information that may result in a personalized plan  for cancer prevention and surveillance.  RECOMMENDATIONS FOR FAMILY MEMBERS:  Women in this family might be at some increased risk of developing cancer, over the general population risk, simply due to the family history of cancer.  We recommended women in this family have a yearly mammogram beginning at age 46, or 80 years younger than the earliest onset of cancer, an annual clinical breast exam, and perform monthly breast self-exams. Women in this family should also have a gynecological exam as recommended by their primary provider. All family members should have a colonoscopy by age 50.  FOLLOW-UP: Lastly, we discussed with [name] that cancer genetics is a rapidly advancing field and it is possible that new genetic tests will be appropriate for her and/or her family members in the future. We encouraged her to remain in contact with cancer genetics on an annual basis so we can update her personal and family histories and let her know of advances in cancer genetics that may benefit this family.   Our contact number was provided.  [name] questions were answered to her satisfaction, and she knows she is welcome to call us at anytime with additional questions or concerns.   Roma Kayser, MS, [facility] Certified Genetic Counselor [email or web address]

## 2018-12-15 ENCOUNTER — Telehealth: Payer: Self-pay | Admitting: Internal Medicine

## 2018-12-15 ENCOUNTER — Other Ambulatory Visit: Payer: Self-pay | Admitting: Family Medicine

## 2018-12-15 DIAGNOSIS — G4700 Insomnia, unspecified: Secondary | ICD-10-CM

## 2018-12-15 NOTE — Telephone Encounter (Signed)
Returned call to [facility].  I did let Patrici Ranks know that a message has been sent to MR, and we are waiting for him to respond.

## 2018-12-15 NOTE — Telephone Encounter (Signed)
I have called and spoke with [name] she verbalized understanding and will change parameters to 88-90%. Nothing further is needed.

## 2018-12-15 NOTE — Telephone Encounter (Signed)
A pulse ox >/= 88% is adequate. So yes they can change the parameters

## 2018-12-15 NOTE — Telephone Encounter (Signed)
Left message informing RN Patrici Ranks we have gotten her message.   Per the message they monitor the patient's oxygen and when her oxygen drops it alarms their system. They would like her oxygen parameters changed to alarm them when her oxygen is between 88%-90% as they are having to call the patient frequently during the night. Called patient to confirm this information.   MR please advise if we can change oxygen parameters to 88%-90%. Thanks.

## 2018-12-15 NOTE — Telephone Encounter (Signed)
Patient is returning phone call.  Patient phone number is [phone].

## 2018-12-16 ENCOUNTER — Other Ambulatory Visit: Payer: Self-pay | Admitting: *Deleted

## 2018-12-16 NOTE — Patient Outreach (Signed)
[facility]) Care Management  12/16/2018  [name] Feb 23, 1946 [id number]    [place] received a notification that patient is enrolled in [place] care management program. Tawanna Sat More will be handling her case.  Plan: RN Health Coach closed case. Consumer is enrolled in an external program  [place] [facility] [phone]

## 2018-12-23 ENCOUNTER — Other Ambulatory Visit: Payer: Self-pay | Admitting: Family Medicine

## 2018-12-23 DIAGNOSIS — E039 Hypothyroidism, unspecified: Secondary | ICD-10-CM

## 2018-12-24 ENCOUNTER — Other Ambulatory Visit: Payer: Self-pay | Admitting: Nurse Practitioner

## 2018-12-25 ENCOUNTER — Ambulatory Visit: Payer: Self-pay | Admitting: *Deleted

## 2018-12-30 ENCOUNTER — Other Ambulatory Visit: Payer: Self-pay | Admitting: Family Medicine

## 2018-12-30 DIAGNOSIS — F419 Anxiety disorder, unspecified: Secondary | ICD-10-CM

## 2018-12-30 DIAGNOSIS — G4700 Insomnia, unspecified: Secondary | ICD-10-CM

## 2018-12-31 ENCOUNTER — Telehealth: Payer: Self-pay | Admitting: *Deleted

## 2018-12-31 ENCOUNTER — Other Ambulatory Visit: Payer: Self-pay | Admitting: Family Medicine

## 2018-12-31 DIAGNOSIS — G4700 Insomnia, unspecified: Secondary | ICD-10-CM

## 2018-12-31 DIAGNOSIS — F419 Anxiety disorder, unspecified: Secondary | ICD-10-CM

## 2018-12-31 NOTE — Telephone Encounter (Signed)
Last OV she reported to me she was taking Xanax 2 mg 1/2 tab but despite of this I am still prescribing #30 tabs per month. Last 2 Rx for Xanax filled on 11/03/18 and 11/30/18; she filled 2nd Rx 4 days earlier.  Kristin Lamagna Martinique, MD

## 2018-12-31 NOTE — Telephone Encounter (Signed)
Copied from [place] [id number]. Topic: General - Other >> Dec 31, 2018 11:04 AM Leward Quan A wrote: Reason for CRM: Patient called to say that she have one LORazepam (ATIVAN) 2 MG tablet left. She was informed that Rx was sent to the Pharmacy and she will be able to pick it up on 01/02/2019. I called pharmacy and confirmed receipt of this Rx before advising the patient.  Patient was not happy because according to her she will be out for 1 night. She asked to be sure Dr Martinique was aware.Marland KitchenMarland Kitchen

## 2019-01-04 ENCOUNTER — Other Ambulatory Visit: Payer: Self-pay | Admitting: Family Medicine

## 2019-01-04 DIAGNOSIS — K219 Gastro-esophageal reflux disease without esophagitis: Secondary | ICD-10-CM

## 2019-01-05 ENCOUNTER — Other Ambulatory Visit: Payer: Self-pay | Admitting: Family Medicine

## 2019-01-05 ENCOUNTER — Ambulatory Visit: Payer: Medicare Other | Admitting: Primary Care

## 2019-01-25 ENCOUNTER — Other Ambulatory Visit: Payer: Self-pay | Admitting: Internal Medicine

## 2019-01-29 ENCOUNTER — Other Ambulatory Visit: Payer: Self-pay | Admitting: Family Medicine

## 2019-01-29 DIAGNOSIS — G4700 Insomnia, unspecified: Secondary | ICD-10-CM

## 2019-01-29 DIAGNOSIS — F419 Anxiety disorder, unspecified: Secondary | ICD-10-CM

## 2019-01-29 NOTE — Telephone Encounter (Signed)
Left message to return call to clinic to schedule virtual visit for medication refills via [email or web address].

## 2019-01-30 ENCOUNTER — Other Ambulatory Visit: Payer: Self-pay

## 2019-01-30 ENCOUNTER — Other Ambulatory Visit: Payer: Self-pay | Admitting: Family Medicine

## 2019-01-30 ENCOUNTER — Ambulatory Visit (INDEPENDENT_AMBULATORY_CARE_PROVIDER_SITE_OTHER): Payer: Medicare Other | Admitting: Family Medicine

## 2019-01-30 DIAGNOSIS — G4700 Insomnia, unspecified: Secondary | ICD-10-CM

## 2019-01-30 DIAGNOSIS — F419 Anxiety disorder, unspecified: Secondary | ICD-10-CM

## 2019-01-30 DIAGNOSIS — K219 Gastro-esophageal reflux disease without esophagitis: Secondary | ICD-10-CM | POA: Diagnosis not present

## 2019-01-30 MED ORDER — DEXLANSOPRAZOLE 60 MG PO CPDR: 60.0000 mg | DELAYED_RELEASE_CAPSULE | Freq: Every day | ORAL | 1 refills | Status: DC

## 2019-01-30 MED ORDER — FAMOTIDINE 20 MG PO TABS: 20.0000 mg | ORAL_TABLET | Freq: Two times a day (BID) | ORAL | 3 refills | Status: DC

## 2019-01-30 NOTE — Assessment & Plan Note (Signed)
Problem is a stable. No changes in current management. We discussed some side effects of medications. Good sleep hygiene. Follow-up in 5 to 6 months.

## 2019-01-30 NOTE — Progress Notes (Signed)
Virtual Visit via Telephone Note  I connected with [name] on 01/30/19 at  3:00 PM EDT by telephone and verified that I am speaking with the correct person using two identifiers.   I discussed the limitations, risks, security and privacy concerns of performing an evaluation and management service by telephone and the availability of in person appointments. I also discussed with the patient that there may be a patient responsible charge related to this service. The patient expressed understanding and agreed to proceed.  Location patient: home Location provider: work office Participants present for the call: patient, provider Patient did not have a visit in the prior 7 days to address this/these issue(s).   History of Present Illness: Today we are following on some of her chronic medical problems.   GERD: Currently she is on omeprazole 40 mg twice daily. Last visit Protonix 40 mg were recommended because symptoms were not well controlled.  After this led she call and ask to be changed back to omeprazole. Today she is requesting medication prescribed last visit that according to her, helped tremendously with GERD symptoms.  She is not taking Zantac, she does not think this is a medication she is talking about. She denies abdominal pain, nausea, vomiting, changes in bowel habits. Heartburn is "terrible."  Anxiety and insomnia, currently she is on trazodone 50 mg daily, amitriptyline 50 mg daily. She is tolerating medication well, denies side effects. She has been on amitriptyline for many years, try to wean herself off but did not tolerate it well. She denies depressed mood or suicidal thoughts.  If she does not take medications, she is not able to sleep.  She is taking Xanax 2 mg daily. Last refill on 12/31/2018.   Observations/Objective: Patient sounds cheerful and well on the phone. I do not appreciate any SOB. Speech and thought processing are grossly intact. Patient reported  vitals:N/A  Assessment and Plan:  Diagnoses and all orders for this visit:  GERD Problem is not well controlled. Last time omeprazole was changed to Protonix but she called a few days later asking to be changed back to omeprazole. I am recommending trying Dexilant 60 mg daily. Zantac is not back to the market yet, so she can try Pepcid 20 mg twice daily. GERD precautions discussed.   Insomnia Problem is a stable. No changes in current management. We discussed some side effects of medications. Good sleep hygiene. Follow-up in 5 to 6 months.  Anxiety disorder Problem is stable, well controlled with Xanax 2 mg daily as needed. We discussed some side effects of medication. Instructed about warning signs. [place] controlled substance website was reviewed. Follow-up in 5 to 6 months, before if needed.   Follow Up Instructions:  We will continue following every 5 to 6 months, before if needed. We discussed the importance of filling prescription for Xanax every 30 days. Fall precautions, high risk due to medication she is taking. Continue following with pulmonologist for COPD.  I did not refer this patient for an OV in the next 24 hours for this/these issue(s).  I discussed the assessment and treatment plan with the patient. She was provided an opportunity to ask questions and all were answered. She agreed with the plan and demonstrated an understanding of the instructions.    I provided 22 minutes of non-face-to-face time during this encounter.    Martinique, MD

## 2019-01-30 NOTE — Assessment & Plan Note (Signed)
Problem is stable, well controlled with Xanax 2 mg daily as needed. We discussed some side effects of medication. Instructed about warning signs. [place] controlled substance website was reviewed. Follow-up in 5 to 6 months, before if needed.

## 2019-01-30 NOTE — Telephone Encounter (Signed)
OV scheduled at 3 pm on today, 01/30/2019.

## 2019-01-30 NOTE — Assessment & Plan Note (Signed)
Problem is not well controlled. Last time omeprazole was changed to Protonix but she called a few days later asking to be changed back to omeprazole. I am recommending trying Dexilant 60 mg daily. Zantac is not back to the market yet, so she can try Pepcid 20 mg twice daily. GERD precautions discussed.

## 2019-01-31 ENCOUNTER — Encounter: Payer: Self-pay | Admitting: Family Medicine

## 2019-02-22 ENCOUNTER — Other Ambulatory Visit: Payer: Self-pay | Admitting: Family Medicine

## 2019-03-07 ENCOUNTER — Other Ambulatory Visit: Payer: Self-pay | Admitting: Family Medicine

## 2019-03-11 ENCOUNTER — Other Ambulatory Visit: Payer: Self-pay | Admitting: Family Medicine

## 2019-03-11 DIAGNOSIS — G4700 Insomnia, unspecified: Secondary | ICD-10-CM

## 2019-04-10 ENCOUNTER — Other Ambulatory Visit: Payer: Self-pay | Admitting: Nurse Practitioner

## 2019-04-10 DIAGNOSIS — I2510 Atherosclerotic heart disease of native coronary artery without angina pectoris: Secondary | ICD-10-CM

## 2019-04-10 DIAGNOSIS — E7800 Pure hypercholesterolemia, unspecified: Secondary | ICD-10-CM

## 2019-04-10 DIAGNOSIS — I10 Essential (primary) hypertension: Secondary | ICD-10-CM

## 2019-04-12 ENCOUNTER — Other Ambulatory Visit: Payer: Self-pay | Admitting: Family Medicine

## 2019-04-14 ENCOUNTER — Other Ambulatory Visit: Payer: Self-pay

## 2019-04-14 ENCOUNTER — Ambulatory Visit (INDEPENDENT_AMBULATORY_CARE_PROVIDER_SITE_OTHER): Payer: Medicare Other | Admitting: Family Medicine

## 2019-04-14 DIAGNOSIS — R300 Dysuria: Secondary | ICD-10-CM

## 2019-04-14 MED ORDER — FLUCONAZOLE 150 MG PO TABS: 150.0000 mg | ORAL_TABLET | Freq: Once | ORAL | 0 refills | Status: AC

## 2019-04-14 MED ORDER — TERCONAZOLE 0.4 % VA CREA: 1.0000 | TOPICAL_CREAM | Freq: Every day | VAGINAL | 0 refills | Status: AC

## 2019-04-14 NOTE — Progress Notes (Signed)
Virtual Visit via Telephone Note  I connected with [name] on 04/14/19 at  9:00 AM EDT by telephone and verified that I am speaking with the correct person using two identifiers.   I discussed the limitations, risks, security and privacy concerns of performing an evaluation and management service by telephone and the availability of in person appointments. I also discussed with the patient that there may be a patient responsible charge related to this service. The patient expressed understanding and agreed to proceed.  Location patient: home Location provider:home office Participants present for the call: patient, provider Patient did not have a visit in the prior 7 days to address this/these issue(s).   History of Present Illness:   [name] is a 73 yo female with Hx of COPD and anxiety c/o 2 months of intermittent dysuria and urgency. She follows with urologist annually and states that she has had cystoscopy done and nothing that explained symptoms. Per pt report Ucx negative.  She has completed abx treatment, not improvement. Denies chills,fever,unusual fatigue,abdominal pain, N/V,gross hematuria,decreased urine output,vaginal discharge or bleeding. Denies vaginal pruritus.  + Former smoker. She tried 2 medications OTC for urine infection and did not help.  Hx of bladder cancer, not invasive. Next appt with urologist in 11/2019.  Recently completed a "heavy abx" treatment and prednisone for COPD exacerbation.  Hx of breast cancer,follows with oncologist.  Observations/Objective: Patient sounds cheerful and well on the phone. I do not appreciate any SOB. Speech and thought processing are grossly intact.+ Anxious. Patient reported vitals:N/A  Assessment and Plan: 1. Dysuria Possible etiologies discussed. UTI has been ruled out,per pt report. ? Years infection,IC among some to consider. Completed "heavy abx" treatment recently,so empiric treatment with Diflucan and  Terazol recommended. If dysuria persists she needs to follow with her urologist.  Follow Up Instructions: Instructed about warning signs.  I did not refer this patient for an OV in the next 24 hours for this/these issue(s).  I discussed the assessment and treatment plan with the patient. She was provided an opportunity to ask questions and all were answered. The patient agreed with the plan and demonstrated an understanding of the instructions.   The patient was advised to call back or seek an in-person evaluation if the symptoms worsen or if the condition fails to improve as anticipated.  I provided 11 minutes of non-face-to-face time during this encounter.    Martinique, MD

## 2019-04-16 ENCOUNTER — Encounter: Payer: Self-pay | Admitting: Family Medicine

## 2019-04-16 ENCOUNTER — Telehealth: Payer: Self-pay | Admitting: Family Medicine

## 2019-04-16 NOTE — Telephone Encounter (Signed)
fluticasone (FLONASE) 50 MCG/ACT nasal spray [[id number]]    Pharmacy called and stated that this medication has a drug interaction with cilostazol. Pharmacy would like a call back regarding. Please advise

## 2019-04-17 NOTE — Telephone Encounter (Signed)
Patient is calling regarding Cilostazol. The pharmacy is waiting for a response. Please advise thank you

## 2019-04-20 NOTE — Telephone Encounter (Signed)
Message sent to Dr. Jordan for review. 

## 2019-04-23 ENCOUNTER — Other Ambulatory Visit: Payer: Self-pay | Admitting: *Deleted

## 2019-04-23 ENCOUNTER — Other Ambulatory Visit: Payer: Self-pay | Admitting: Family Medicine

## 2019-04-23 DIAGNOSIS — Z1231 Encounter for screening mammogram for malignant neoplasm of breast: Secondary | ICD-10-CM

## 2019-04-23 NOTE — Telephone Encounter (Signed)
Spoke to pharmacy concerning medication interactions.

## 2019-04-23 NOTE — Telephone Encounter (Signed)
Most likely prescribed by cardiologist, blood thinner. I have not filled this medication for her before. If she is out completely a 30 days supply can be sent.  Thanks, BJ

## 2019-04-23 NOTE — Telephone Encounter (Signed)
Spoke with pharmacy and informed them that per Dr. Martinique, medication was prescribed by the cardiologist. Pharmacy stated that they would contact that office. Nothing further needed at this time.

## 2019-05-08 ENCOUNTER — Other Ambulatory Visit: Payer: Self-pay | Admitting: Family Medicine

## 2019-06-01 ENCOUNTER — Other Ambulatory Visit: Payer: Self-pay | Admitting: Family Medicine

## 2019-06-01 DIAGNOSIS — G4700 Insomnia, unspecified: Secondary | ICD-10-CM

## 2019-06-01 DIAGNOSIS — F419 Anxiety disorder, unspecified: Secondary | ICD-10-CM

## 2019-06-02 NOTE — Telephone Encounter (Signed)
Please advise on refill request

## 2019-06-02 NOTE — Telephone Encounter (Signed)
Medication Refill - Medication: Lorezapam (Patient stated that the pharmacy stills needs medication signed off on.)  Has the patient contacted their pharmacy? Yes (Agent: If no, request that the patient contact the pharmacy for the refill.) (Agent: If yes, when and what did the pharmacy advise?)Contact PCP  Preferred Pharmacy (with phone number or street name):  [facility], [place] [address] [phone] (Phone) [phone] (Fax)     Agent: Please be advised that RX refills may take up to 3 business days. We ask that you follow-up with your pharmacy.

## 2019-06-02 NOTE — Telephone Encounter (Signed)
Requested medication (s) are due for refill today: yes   Requested medication (s) are on the active medication list: yes   Last refill: 01/30/2019  #30  0 refills  Future visit scheduled No  Notes to clinic    not delegated  Requested Prescriptions  Pending Prescriptions Disp Refills   LORazepam (ATIVAN) 2 MG tablet [Pharmacy Med Name: LORAZEPAM 2 MG TABLET] 30 tablet 3    Sig: TAKE 1 TABLET BY MOUTH AT BEDTIME AS NEEDED FOR ANXIETY     Not Delegated - Psychiatry:  Anxiolytics/Hypnotics Failed - 06/02/2019  6:30 PM      Failed - This refill cannot be delegated      Failed - Urine Drug Screen completed in last 360 days.      Passed - Valid encounter within last 6 months    Recent Outpatient Visits          1 month ago [place] [place], Malka So, MD   4 months ago Gastroesophageal reflux disease, esophagitis presence not specified   Therapist, [facility] [place], Malka So, MD   11 months ago Insomnia, unspecified type   [facility] [place], Malka So, MD   1 year ago Insomnia, unspecified type   [facility] [place], Malka So, MD   1 year ago Insomnia, unspecified type   Therapist, [facility] [place], Malka So, MD             Signed Prescriptions Disp Refills   fluticasone (FLONASE) 50 MCG/ACT nasal spray 16 mL 1    Sig: SPRAY 2 SPRAYS INTO EACH NOSTRIL ONCE TO TWICE DAILY     Ear, Nose, and Throat: Nasal Preparations - Corticosteroids Passed - 06/02/2019  4:36 PM      Passed - Valid encounter within last 12 months    Recent Outpatient Visits          1 month ago [place] [facility] [place], Malka So, MD   4 months ago Gastroesophageal reflux disease, esophagitis presence not specified   Therapist[facility] [place], Malka So, MD   11 months ago Insomnia, unspecified type   [facility] [place], Malka So, MD   1 year ago Insomnia,  unspecified type   Therapist[facility], Malka So, MD   1 year ago Insomnia, unspecified type   Therapist, [facility] [place], Malka So, MD

## 2019-06-02 NOTE — Telephone Encounter (Signed)
Pt called back investigating status. States that she did not sleep at all last night and is afraid she will not be able to sleep again.

## 2019-06-02 NOTE — Telephone Encounter (Signed)
Refill request already sent to Dr. Martinique, pending approval from Dr. Martinique.

## 2019-06-03 ENCOUNTER — Other Ambulatory Visit: Payer: Self-pay

## 2019-06-03 ENCOUNTER — Ambulatory Visit
Admission: RE | Admit: 2019-06-03 | Discharge: 2019-06-03 | Disposition: A | Discharge status: Home / Self Care | Payer: Medicare Other | Source: Ambulatory Visit | Attending: Family Medicine | Admitting: Family Medicine

## 2019-06-03 DIAGNOSIS — Z1231 Encounter for screening mammogram for malignant neoplasm of breast: Secondary | ICD-10-CM

## 2019-06-05 ENCOUNTER — Other Ambulatory Visit: Payer: Self-pay | Admitting: Family Medicine

## 2019-06-05 DIAGNOSIS — K219 Gastro-esophageal reflux disease without esophagitis: Secondary | ICD-10-CM

## 2019-06-12 ENCOUNTER — Telehealth: Payer: Self-pay

## 2019-06-12 NOTE — Telephone Encounter (Signed)
Copied from [place] [phone]. Topic: General - Inquiry >> Jun 12, 2019 10:10 AM Richardo Priest, NT wrote: Reason for CRM: Patient called in asking if Chantix can be called in to local pharmacy. Patient stated it has been some years since taking, and she has begun smoking again due to family hardships at the moment. Unable to schedule appointment due to schedule being blocked. Please advise.

## 2019-06-12 NOTE — Telephone Encounter (Signed)
Message sent to Dr. Jordan for review and approval. 

## 2019-06-15 ENCOUNTER — Other Ambulatory Visit: Payer: Self-pay | Admitting: Family Medicine

## 2019-06-15 DIAGNOSIS — E039 Hypothyroidism, unspecified: Secondary | ICD-10-CM

## 2019-06-15 DIAGNOSIS — Z720 Tobacco use: Secondary | ICD-10-CM

## 2019-06-15 MED ORDER — CHANTIX STARTING MONTH PAK 0.5 MG X 11 & 1 MG X 42 PO TABS: ORAL_TABLET | ORAL | 0 refills | Status: DC

## 2019-06-15 NOTE — Telephone Encounter (Signed)
Rx sent with instructions of how titrate dose up. Thanks, BJ

## 2019-06-24 ENCOUNTER — Other Ambulatory Visit: Payer: Self-pay | Admitting: Family Medicine

## 2019-06-25 ENCOUNTER — Other Ambulatory Visit: Payer: Self-pay | Admitting: Family Medicine

## 2019-06-25 DIAGNOSIS — K219 Gastro-esophageal reflux disease without esophagitis: Secondary | ICD-10-CM

## 2019-06-27 ENCOUNTER — Other Ambulatory Visit: Payer: Self-pay | Admitting: Family Medicine

## 2019-06-29 NOTE — Telephone Encounter (Signed)
Routing to Dr. Martinique for approval

## 2019-07-06 ENCOUNTER — Telehealth: Payer: Self-pay | Admitting: Family Medicine

## 2019-07-06 NOTE — Telephone Encounter (Signed)
Message sent to Dr. Jordan for review. 

## 2019-07-06 NOTE — Telephone Encounter (Signed)
Copied from [place] [phone]. Topic: General - Other >> Jul 06, 2019  9:08 AM Keene Breath wrote: Reason for CRM: [facility] nurse called to inform the doctor of the the patient's BP which has been running low over the last few days.  Patient states that she feels tired as well.  - Friday 92/64, Sat. 86/65, 96/77, Sun. 90/60, 5/63, today 108/73.  Nurse recommends that the doctor or nurse follow up with the patient.  CB# for nurse is [phone], ext. [phone].

## 2019-07-06 NOTE — Telephone Encounter (Signed)
Spoke with patient briefly and she asked if I could call her back due to the loss of a family member. Patient was crying and then hung up the phone. Will follow-up with patient at a later time.

## 2019-07-06 NOTE — Telephone Encounter (Signed)
She is not taking medications that could be causing hypotension. Any new medication or acute illness?  Adequate hydration recommended for now. THS has not been checked since 12/2017 and in 11/2018 renal function mildly abnormal; so TSH and BMP can be ordered.  Thanks, BJ

## 2019-07-08 ENCOUNTER — Other Ambulatory Visit: Payer: Self-pay | Admitting: *Deleted

## 2019-07-08 DIAGNOSIS — I10 Essential (primary) hypertension: Secondary | ICD-10-CM

## 2019-07-08 DIAGNOSIS — E039 Hypothyroidism, unspecified: Secondary | ICD-10-CM

## 2019-07-08 NOTE — Telephone Encounter (Signed)
Spoke with patient and she stated that her b/p is always low in the mornings and then go back to normal range later in the day. Patient stated that she is doing okay. Patient scheduled lab appointment to have TSH and BMP done per Dr. Martinique.

## 2019-07-09 ENCOUNTER — Other Ambulatory Visit: Payer: Self-pay

## 2019-07-09 ENCOUNTER — Other Ambulatory Visit (INDEPENDENT_AMBULATORY_CARE_PROVIDER_SITE_OTHER): Payer: Medicare Other

## 2019-07-09 DIAGNOSIS — I10 Essential (primary) hypertension: Secondary | ICD-10-CM

## 2019-07-09 DIAGNOSIS — E875 Hyperkalemia: Secondary | ICD-10-CM

## 2019-07-09 DIAGNOSIS — E039 Hypothyroidism, unspecified: Secondary | ICD-10-CM

## 2019-07-09 LAB — TSH: TSH: 0.86 u[IU]/mL (ref 0.35–4.50)

## 2019-07-09 LAB — BASIC METABOLIC PANEL
BUN: 9 mg/dL (ref 6–23)
CO2: 26 mEq/L (ref 19–32)
Calcium: 10.3 mg/dL (ref 8.4–10.5)
Chloride: 100 mEq/L (ref 96–112)
Creatinine, Ser: 0.76 mg/dL (ref 0.40–1.20)
GFR: 74.26 mL/min (ref >60.00)
Glucose, Bld: 66 mg/dL — ABNORMAL LOW (ref 70–99)
Potassium: 5.3 mEq/L — ABNORMAL HIGH (ref 3.5–5.1)
Sodium: 137 mEq/L (ref 135–145)

## 2019-07-10 ENCOUNTER — Telehealth: Payer: Self-pay | Admitting: *Deleted

## 2019-07-10 ENCOUNTER — Ambulatory Visit: Payer: Self-pay | Admitting: *Deleted

## 2019-07-10 NOTE — Telephone Encounter (Signed)
I returned pt's call.   She just wanted to know if her labs from yesterday were back yet.    I let her know they were not ready yet.   (Not interpreted by Dr. Martinique yet).   We would call her once they were ready.  I asked her about the note from the agent from her original call where the message was left for me to call her back.   It mentioned her O2 sat 90-91 % and she had congestion.   She said those O2 levels are normal for her when she first gets up but after she has been up they go up to 95%.  "I have a runny nose and a cough with clear sputum".   "No fever".    She declined to let the agent make her an appt for a virtual visit due to "too much going on at my house today".    She answered by questions quickly and was quick to get off the phone like she was in a hurry.  We will call her once lab results are ready.  No triage done.  Answer Assessment - Initial Assessment Questions 1. ONSET: "When did the nasal discharge start?"      Yesterday coughing, drainage and aching.   No fever  Clear sputum. 2. AMOUNT: "How much discharge is there?"      *No Answer* 3. COUGH: "Do you have a cough?" If yes, ask: "Describe the color of your sputum" (clear, white, yellow, green)     Yes 4. RESPIRATORY DISTRESS: "Describe your breathing."      No 5. FEVER: "Do you have a fever?" If so, ask: "What is your temperature, how was it measured, and when did it start?"     No 6. SEVERITY: "Overall, how bad are you feeling right now?" (e.g., doesn't interfere with normal activities, staying home from school/work, staying in bed)      Achy 7. OTHER SYMPTOMS: "Do you have any other symptoms?" (e.g., sore throat, earache, wheezing, vomiting)     O2 levels 90-91 % when I just got up.   That's normal for me. 8. PREGNANCY: "Is there any chance you are pregnant?" "When was your last menstrual period?"     N/A  Protocols used: COMMON COLD-A-AH

## 2019-07-10 NOTE — Telephone Encounter (Signed)
Copied from [place] [id number]. Topic: Quick Communication - Lab Results (Clinic Use ONLY) >> Jul 10, 2019  9:31 AM [name] wrote: Pt calling for lab results. Please call back.  [name] I have sent triage message, pt called with her [place] on line, today she has runny nose, weak legs, congestion and was in lab yesterday) declined ov

## 2019-07-10 NOTE — Telephone Encounter (Signed)
Patient requesting lab results Please advise 

## 2019-07-13 ENCOUNTER — Ambulatory Visit: Payer: Self-pay | Admitting: Family Medicine

## 2019-07-13 ENCOUNTER — Encounter (HOSPITAL_COMMUNITY): Payer: Self-pay

## 2019-07-13 ENCOUNTER — Other Ambulatory Visit: Payer: Self-pay

## 2019-07-13 ENCOUNTER — Emergency Department (HOSPITAL_COMMUNITY): Payer: Medicare Other

## 2019-07-13 ENCOUNTER — Emergency Department (HOSPITAL_COMMUNITY)
Admission: EM | Admit: 2019-07-13 | Discharge: 2019-07-14 | Disposition: A | Discharge status: Home / Self Care | Payer: Medicare Other | Attending: Emergency Medicine | Admitting: Emergency Medicine

## 2019-07-13 DIAGNOSIS — Z87891 Personal history of nicotine dependence: Secondary | ICD-10-CM | POA: Insufficient documentation

## 2019-07-13 DIAGNOSIS — Z20828 Contact with and (suspected) exposure to other viral communicable diseases: Secondary | ICD-10-CM | POA: Diagnosis not present

## 2019-07-13 DIAGNOSIS — E039 Hypothyroidism, unspecified: Secondary | ICD-10-CM | POA: Insufficient documentation

## 2019-07-13 DIAGNOSIS — R0602 Shortness of breath: Secondary | ICD-10-CM | POA: Diagnosis present

## 2019-07-13 DIAGNOSIS — Z7982 Long term (current) use of aspirin: Secondary | ICD-10-CM | POA: Diagnosis not present

## 2019-07-13 DIAGNOSIS — J441 Chronic obstructive pulmonary disease with (acute) exacerbation: Secondary | ICD-10-CM | POA: Diagnosis not present

## 2019-07-13 DIAGNOSIS — Z79899 Other long term (current) drug therapy: Secondary | ICD-10-CM | POA: Insufficient documentation

## 2019-07-13 DIAGNOSIS — I10 Essential (primary) hypertension: Secondary | ICD-10-CM | POA: Insufficient documentation

## 2019-07-13 LAB — CBC WITH DIFFERENTIAL/PLATELET
Abs Immature Granulocytes: 0.02 10*3/uL (ref 0.00–0.07)
Basophils Absolute: 0.1 10*3/uL (ref 0.0–0.1)
Basophils Relative: 1 %
Eosinophils Absolute: 0.2 10*3/uL (ref 0.0–0.5)
Eosinophils Relative: 4 %
HCT: 47.4 % — ABNORMAL HIGH (ref 36.0–46.0)
Hemoglobin: 15.7 g/dL — ABNORMAL HIGH (ref 12.0–15.0)
Immature Granulocytes: 0 %
Lymphocytes Relative: 22 %
Lymphs Abs: 1 10*3/uL (ref 0.7–4.0)
MCH: 30.1 pg (ref 26.0–34.0)
MCHC: 33.1 g/dL (ref 30.0–36.0)
MCV: 91 fL (ref 80.0–100.0)
Monocytes Absolute: 0.9 10*3/uL (ref 0.1–1.0)
Monocytes Relative: 19 %
Neutro Abs: 2.4 10*3/uL (ref 1.7–7.7)
Neutrophils Relative %: 54 %
Platelets: 325 10*3/uL (ref 150–400)
RBC: 5.21 MIL/uL — ABNORMAL HIGH (ref 3.87–5.11)
RDW: 14.6 % (ref 11.5–15.5)
WBC: 4.5 10*3/uL (ref 4.0–10.5)
nRBC: 0 % (ref 0.0–0.2)

## 2019-07-13 LAB — BASIC METABOLIC PANEL
Anion gap: 9 (ref 5–15)
BUN: 12 mg/dL (ref 8–23)
CO2: 25 mmol/L (ref 22–32)
Calcium: 9.7 mg/dL (ref 8.9–10.3)
Chloride: 103 mmol/L (ref 98–111)
Creatinine, Ser: 0.62 mg/dL (ref 0.44–1.00)
GFR calc Af Amer: >60 mL/min (ref >60)
GFR calc non Af Amer: >60 mL/min (ref >60)
Glucose, Bld: 73 mg/dL (ref 70–99)
Potassium: 4.1 mmol/L (ref 3.5–5.1)
Sodium: 137 mmol/L (ref 135–145)

## 2019-07-13 LAB — SARS CORONAVIRUS 2 BY RT PCR (HOSPITAL ORDER, PERFORMED IN ~~LOC~~ HOSPITAL LAB): SARS Coronavirus 2: NEGATIVE

## 2019-07-13 MED ORDER — IPRATROPIUM-ALBUTEROL 0.5-2.5 (3) MG/3ML IN SOLN
3.0000 mL | Freq: Once | RESPIRATORY_TRACT | Status: AC
  Administered 2019-07-13: 3 mL via RESPIRATORY_TRACT
  Filled 2019-07-13: qty 3

## 2019-07-13 MED ORDER — MAGNESIUM SULFATE 2 GM/50ML IV SOLN
2.0000 g | Freq: Once | INTRAVENOUS | Status: AC
  Administered 2019-07-13: 2 g via INTRAVENOUS
  Filled 2019-07-13: qty 50

## 2019-07-13 MED ORDER — ALBUTEROL SULFATE HFA 108 (90 BASE) MCG/ACT IN AERS
8.0000 | INHALATION_SPRAY | Freq: Once | RESPIRATORY_TRACT | Status: AC
  Administered 2019-07-13: 8 via RESPIRATORY_TRACT
  Filled 2019-07-13: qty 6.7

## 2019-07-13 MED ORDER — METHYLPREDNISOLONE SODIUM SUCC 125 MG IJ SOLR
125.0000 mg | Freq: Once | INTRAMUSCULAR | Status: AC
  Administered 2019-07-13: 125 mg via INTRAVENOUS
  Filled 2019-07-13: qty 2

## 2019-07-13 NOTE — Telephone Encounter (Signed)
'  Nicole'RN with [facility] calling to report pt's BP over last 2 days running at 79/57   82/57, during call 96/65.  Pt on line as well. Reports body aches, headache, weakness, cough. On O2 at HS at 2 L's. Reports on RA this am 88-89%. Audible wheeze present during call.  Pt states afebrile, is staying hydrated. No known contact to positive covid individual. Reports dysuria; was to pick up ATB ordered by Dr. Martinique States "Pharmacy would not fill due to conflict with other med." States notified practice but has not heard back. [place] did not see documentation regarding this issue. Speech halting during call and audible wheezing. Pt directed to ED. Verbalizes understanding, states will follow disposition.  Reason for Disposition . Patient sounds very sick or weak to the triager  Answer Assessment - Initial Assessment Questions 1. BLOOD PRESSURE: "What is the blood pressure?" "Did you take at least two measurements 5 minutes apart?"    79/59-83/57   During call with [facility] 96/65 2. ONSET: "When did you take your blood pressure?"     Monitored through tele devise with [employer] 3. HOW: "How did you obtain the blood pressure?" (e.g., visiting nurse, automatic home BP monitor)      4. HISTORY: "Do you have a history of low blood pressure?" "What is your blood pressure normally?"    no 5. MEDICATIONS: "Are you taking any medications for blood pressure?" If yes: "Have they been changed recently?"     none 6. PULSE RATE: "Do you know what your pulse rate is?"      90 7. OTHER SYMPTOMS: "Have you been sick recently?" "Have you had a recent injury?"     Congestion, wheezing, body aches, cough, dysuria, headache, generalized weakness  Protocols used: LOW BLOOD PRESSURE-A-AH

## 2019-07-13 NOTE — ED Notes (Signed)
NO ACUTE DISTRESS. WATCHING TV

## 2019-07-13 NOTE — ED Notes (Signed)
PT ENDORSES 02 2LNC ONLY AT NIGHT

## 2019-07-13 NOTE — ED Triage Notes (Signed)
Patient states she checks her BP and O2 sats with a nurse on video call. Patient states this AM her Sats were 88% on room air and BP was 88/67. Patient was told to come to the ED. Patient states she wears O2 2L/min via [place]. Patient states this is usually what happenes in the mornings with her sats being 87-88% on room air in the mornings and then it increases during the day.  Sats in triage 94% on room air and BP-123/93.

## 2019-07-13 NOTE — Telephone Encounter (Signed)
Patient has arrived to ED.

## 2019-07-13 NOTE — ED Provider Notes (Signed)
[facility] DEPT Provider Note   CSN: [id number] Arrival date & time: 07/13/19  1315     History   Chief Complaint Chief Complaint  Patient presents with  . low sats  . Hypotension    HPI [name] is a 73 y.o. female.     HPI   She presents for evaluation of shortness of breath with cough productive of white sputum, present for 2 days.  She is worsening despite usual home treatments.  She uses oxygen as needed 2 L per nasal cannula.  She denies fever, chills, chest pain, weakness or dizziness.  She states that her breathing feels "tight," when she takes a deep breath.  No known sick contacts.  There are no other known modifying factors.  Past Medical History:  Diagnosis Date  . Bilateral leg cramps   . Bladder neoplasm   . Chronic deep vein thrombosis (DVT) of right lower extremity ([place])    05/ 2018  chronic non-occlusive DVT RLE  . COPD, frequent exacerbations ([place])    03-06-2018 per pt last exacerbation 12/ 2018  . Coronary artery disease    cardiologist-  dr Aundra Dubin-- 03-11-2018 er cath , 80% stenosis in the ostial second diagonal  . Dyspnea on minimal exertion   . Emphysema/COPD ([place])    CAT score- 17  . Family history of breast cancer   . Family history of colon cancer   . Family history of melanoma   . Family history of ovarian cancer   . Family history of pancreatic cancer   . Fibromyalgia 1995  . GERD (gastroesophageal reflux disease)   . Hiatal hernia   . History of breast cancer   . History of diverticulitis of colon    2002  s/p  sigmoid colectomy  . History of multiple pulmonary nodules    hx RUL nodules x2  s/p right VATS w/ wedge resection 09-11-2005 and 06-14-2011  both  necrotizing granulomatous inflammation w/ cystic area of necrosis & focal calcification  . History of right breast cancer oncologist-  dr Jana Hakim-- no recurrence   dx 2010--  IDC, Stage IA , ER/PR+,  (pT1c pN0) 11-09-2008 right lumpectomy;   12-18-2008 right simple mastectomy for DCIS margins;  completed chemotherapy 2010 (no radiation) and completed antiestrogen therapy  . Hx of colonic polyps    Dr. Cristina Gong -last study '11  . Hyperlipidemia   . Hypertension   . Hypothyroidism   . 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 Nocturia   . OA (osteoarthritis)    thumbs  . On supplemental oxygen therapy    03-06-2018 per pt uses only at night,  checks O2 sats at home,  stated am sat 89% after moving around average 93-94% with RA  . Osteoporosis   . Peripheral arterial occlusive disease [facility]) vascular-- dr chen/ dr Donzetta Matters   proximal right SFA severe focal stenosis with collaterals from the PFA/  04/ 2012 occluded celiac and SMA arteries with distal reconstitution w/ patent IMA  . Peripheral vascular disease ([place])    chronic DVT RLE,  mesenteric vascular disease  . Right lower lobe pulmonary nodule    Chest CT 08-29-2017  . Stage 3 severe COPD by GOLD classification ([place]) hx frequent exacerbations--   pulmologist-  dr Maryann Alar--  per lov note , dated 12-20-2017, oxyogen 2L is prescribed for use with exertion (but pt only uses mostly at night), O2 sats on RA run the 80s ,  this day sat 86% RA and with 2L O2 sat 92%  . Varicose veins of leg with swelling    varicose vein surgery - Dr. Aleda Grana  . Wears glasses   . Wears hearing aid in both ears     Patient Active Problem List   Diagnosis Date Noted  . Genetic testing 12/12/2018  . Family history of breast cancer   . Family history of ovarian cancer   . Family history of pancreatic cancer   . Family history of colon cancer   . Family history of melanoma   . History of breast cancer   . Chronic respiratory failure with hypoxia ([place]) 10/14/2017  . COPD with acute exacerbation ([place]) 10/10/2017  . Flu vaccine need 09/09/2017  . Anxiety disorder 09/09/2017  . Fibromyalgia 09/09/2017  . Thrombocytosis ([place]) 06/21/2017  . Malignant neoplasm of  overlapping sites of right breast in female, estrogen receptor positive ([place]) 06/05/2017  . Edema of right lower extremity 02/26/2017  . Positive ANA (antinuclear antibody) 02/08/2017  . Elevated IgE level and eosinophilia 12/31/2016  . Oral thrush 11/28/2016  . Rash and nonspecific skin eruption 11/28/2016  . Allergic rhinitis 11/28/2016  . History of bacterial pneumonia 08/09/2016  . Routine general medical examination at a health care facility 08/12/2015  . Insomnia 03/04/2015  . Breast cancer, right breast ([place]) 02/17/2015  . CAD (coronary artery disease) 07/11/2011  . Tobacco abuse 03/27/2011  . Chronic mesenteric ischemia ([place]) 03/27/2011  . Solitary pulmonary nodule 03/27/2011  . PAD (peripheral artery disease) ([place]) 03/04/2011  . Arthropathy 11/09/2010  . Hypothyroidism 09/20/2009  . Hyperlipidemia 09/20/2009  . Hearing loss 09/14/2008  . Essential hypertension 09/08/2007  . COPD, frequent exacerbations ([place]) 09/08/2007  . GERD 09/08/2007    Past Surgical History:  Procedure Laterality Date  . BREAST EXCISIONAL BIOPSY Left 10/15/2008  . BREAST LUMPECTOMY W/ NEEDLE LOCALIZATION Right 11-09-2008    dr Brantley Stage   [facility]  . CARDIAC CATHETERIZATION  03-12-2011   dr Aundra Dubin   70-80% ostial stenosis in small second diagonal (appears to small for intervention),  dLAD 40-50%,  minimal luminal irregulartieis involoving LCFx and RCA,  LVEF 55%  . CATARACT EXTRACTION W/ INTRAOCULAR LENS  IMPLANT, BILATERAL  2011  . CYSTOSCOPY N/A 03/11/2018   Procedure: CYSTOSCOPY WITH INSTILLATION OF POST OPERATIVE EPIRUBICIN;  Surgeon: Festus Aloe, MD;  Location: [facility];  Service: Urology;  Laterality: N/A;  . MASTECTOMY Right   . PARTIAL COLECTOMY  2002   sigmoid and appectomy (diverticulitis)  . PARTIAL MASTECTOMY WITH NEEDLE LOCALIZATION Left 02/23/2013   Procedure:  LEFT PARTIAL MASTECTOMY WITH NEEDLE LOCALIZATION;  Surgeon: Adin Hector, MD;  Location: [facility];  Service:  General;  Laterality: Left;  . RECONSTRUCTION BREAST W/ LATISSIMUS DORSI FLAP Right 05-30-2009   dr Harlow Mares  [facility]  . REDUCTION MAMMAPLASTY Left   . SIMPLE MASTECTOMY Right 12-28-2008    dr Brantley Stage  [facility]  . TRANSTHORACIC ECHOCARDIOGRAM  07-09-2016  dr Aundra Dubin   ef 55-60%, grade 1 diastoic dysfunction/  mild TR  . TRANSURETHRAL RESECTION OF BLADDER TUMOR N/A 03/11/2018   Procedure: TRANSURETHRAL RESECTION OF BLADDER TUMOR (TURBT) 2-5cm;  Surgeon: Festus Aloe, MD;  Location: [facility];  Service: Urology;  Laterality: N/A;  . VAGINAL HYSTERECTOMY  1973  . VIDEO ASSISTED THORACOSCOPY (VATS)/WEDGE RESECTION Right 09-11-2005  &  06-14-2011   dr hendrickso  [facility]   both RUL     OB History   No obstetric history on file.  Home Medications    Prior to Admission medications   Medication Sig Start Date End Date Taking? Authorizing Provider  albuterol (PROVENTIL HFA;VENTOLIN HFA) 108 (90 Base) MCG/ACT inhaler Inhale 2 puffs into the lungs every 4 (four) hours as needed for wheezing. As a RESCUE medication 05/07/16   Hosie Poisson, MD  amitriptyline (ELAVIL) 50 MG tablet TAKE 1 TABLET BY MOUTH EVERYDAY AT BEDTIME 07/01/19   [place], [name], MD  aspirin 81 MG tablet Take 81 mg by mouth every evening.     [provider]  atorvastatin (LIPITOR) 80 MG tablet TAKE 1 TABLET BY MOUTH EVERY DAY 04/10/19   [name], NP  cilostazol (PLETAL) 100 MG tablet TAKE 1 TABLET BY MOUTH TWICE A DAY 07/08/18   [name], NP  dexlansoprazole (DEXILANT) 60 MG capsule Take 1 capsule (60 mg total) by mouth daily. 01/30/19   [place], [name], MD  docusate sodium (COLACE) 100 MG capsule Take 100 mg by mouth at bedtime.    [provider]  ezetimibe (ZETIA) 10 MG tablet TAKE 1 TABLET BY MOUTH EVERY DAY 12/24/18   [name], NP  famotidine (PEPCID) 20 MG tablet TAKE 1 TABLET BY MOUTH TWICE A DAY 06/05/19   [place], [name], MD  fluticasone [facility]) 50 MCG/ACT nasal spray SPRAY 2 SPRAYS INTO  EACH NOSTRIL ONCE TO TWICE DAILY 06/29/19   [place], [name], MD  Fluticasone-Umeclidin-Vilant (TRELEGY ELLIPTA) 100-62.5-25 MCG/INH AEPB Inhale 1 puff into the lungs daily. Patient taking differently: Inhale 1 puff into the lungs every morning.  11/08/17   Tanda Rockers, MD  Fluticasone-Umeclidin-Vilant (TRELEGY ELLIPTA) 100-62.5-25 MCG/INH AEPB Inhale 1 puff into the lungs daily. 11/06/18   Martyn Ehrich, NP  guaiFENesin (MUCINEX) 600 MG 12 hr tablet Take 600 mg by mouth 2 (two) times daily as needed.     [provider]  ipratropium-albuterol (DUONEB) 0.5-2.5 (3) MG/3ML SOLN USE 1 NEB 3 TIMES DAILY FOR 3 DAYS, THEN EVERY 6 HOURS AS NEEDED. 01/26/19   Brand Males, MD  levothyroxine (SYNTHROID) 112 MCG tablet TAKE 1 TABLET (112 MCG TOTAL) BY MOUTH DAILY BEFORE BREAKFAST. 06/15/19   [place], [name], MD  LORazepam (ATIVAN) 2 MG tablet TAKE 1 TABLET BY MOUTH AT BEDTIME AS NEEDED FOR ANXIETY 06/03/19   [place], [name], MD  omeprazole (PRILOSEC) 40 MG capsule TAKE 1 CAPSULE BY MOUTH TWICE A DAY 06/25/19   [place], [name], MD  roflumilast (DALIRESP) 500 MCG TABS tablet Take 1 tablet (500 mcg total) by mouth daily. 12/20/17   Brand Males, MD  traZODone (DESYREL) 50 MG tablet TAKE 1/2 TO 1 TABLET BY MOUTH AT BEDTIME AS NEEDED FOR SLEEP 03/13/19   [place], [name], MD  varenicline (CHANTIX STARTING MONTH PAK) 0.5 MG X 11 & 1 MG X 42 tablet Take one 0.5 mg tablet by mouth once daily for 3 days, then increase to one 0.5 mg tablet twice daily for 4 days, then increase to one 1 mg tablet twice daily. 06/15/19 08/31/19  [place], [name], MD    Family History Family History  Problem Relation Age of Onset  . Colon cancer Mother        colon  . COPD Mother        brown lung  . Hypertension Mother   . Diabetes Mother   . Emphysema Mother   . Coronary artery disease Father   . Heart attack Father   . Sudden death Father   . Heart  disease Father   . Cancer Sister        throat  .  Hypertension Sister   . Coronary artery disease Brother   . Heart attack Brother        early 57s  . Throat cancer Sister   . Ovarian cancer Sister        fallopian tube cancer in her 1s  . Melanoma Sister   . Hypertension Sister   . Pancreatic cancer Sister   . Melanoma Niece        dx in her 77s  . Breast cancer Niece 67    Social History Social History   Tobacco Use  . Smoking status: Former Smoker    Packs/day: 0.75    Years: 50.00    Pack years: 37.50    Types: Cigarettes    Quit date: 05/18/2016    Years since quitting: 3.1  . Smokeless tobacco: Never Used  Substance Use Topics  . Alcohol use: Yes    Alcohol/week: 10.0 standard drinks    Types: 10 Cans of beer per week  . Drug use: No     Allergies   Sulfa antibiotics   Review of Systems Review of Systems  All other systems reviewed and are negative.    Physical Exam Updated Vital Signs BP (!) 164/110   Pulse 83   Temp 97.7 F (36.5 C) (Oral)   Resp (!) 22   Ht 5\' 6"  (1.676 m)   Wt 62.6 kg   SpO2 94%   BMI 22.27 kg/m   Physical Exam Vitals signs and nursing note reviewed.  Constitutional:      General: She is not in acute distress.    Appearance: She is well-developed. She is not ill-appearing, toxic-appearing or diaphoretic.  HENT:     Head: Normocephalic and atraumatic.     Right Ear: External ear normal.     Left Ear: External ear normal.     Mouth/Throat:     Pharynx: No oropharyngeal exudate or posterior oropharyngeal erythema.  Eyes:     Conjunctiva/sclera: Conjunctivae normal.     Pupils: Pupils are equal, round, and reactive to light.  Neck:     Musculoskeletal: Normal range of motion and neck supple.     Trachea: Phonation normal.  Cardiovascular:     Rate and Rhythm: Normal rate and regular rhythm.     Heart sounds: Normal heart sounds.  Pulmonary:     Effort: Pulmonary effort is normal. No respiratory distress.     Breath sounds: No stridor.     Comments: Tachypneic.   Decreased air movement bilaterally.  No increased work of breathing.  Scattered rhonchi and wheezes are present. Abdominal:     General: There is no distension.     Palpations: Abdomen is soft.     Tenderness: There is no abdominal tenderness.  Musculoskeletal: Normal range of motion.  Skin:    General: Skin is warm and dry.  Neurological:     Mental Status: She is alert and oriented to person, place, and time.     Cranial Nerves: No cranial nerve deficit.     Sensory: No sensory deficit.     Motor: No abnormal muscle tone.     Coordination: Coordination normal.  Psychiatric:        Mood and Affect: Mood normal.        Behavior: Behavior normal.        Thought Content: Thought content normal.  Judgment: Judgment normal.      ED Treatments / Results  Labs (all labs ordered are listed, but only abnormal results are displayed) Labs Reviewed - No data to display  EKG EKG Interpretation  Date/Time:  Monday July 13 2019 21:42:09 EDT Ventricular Rate:  75 PR Interval:    QRS Duration: 101 QT Interval:  406 QTC Calculation: 161 R Axis:   79 Text Interpretation:  Sinus rhythm Anterior infarct, old since last tracing no significant change Confirmed by Daleen Bo [phone]) on 07/14/2019 10:22:40 AM   Radiology Dg Chest 2 View  Result Date: 07/13/2019 CLINICAL DATA:  Patient states she checks her BP and O2 sats with a nurse on video call. Patient states this AM her sats were 88% on room air and BP was 88/67. Patient was told to come to the ED. EXAM: CHEST - 2 VIEW COMPARISON:  Chest radiograph 10/10/2017, CT chest 11/17/2018 FINDINGS: Stable cardiomediastinal contours. Postsurgical changes are again noted in the right upper lobe. Chronic hyperinflation and diffuse bronchitic changes. No new focal opacity. No pneumothorax or pleural effusion. Degenerative changes in the thoracic spine. IMPRESSION: No acute cardiopulmonary process.  Emphysema. Electronically Signed   By:  Audie Pinto M.D.   On: 07/13/2019 18:54    Procedures .Critical Care Performed by: Daleen Bo, MD Authorized by: Daleen Bo, MD   Critical care provider statement:    Critical care time (minutes):  35   Critical care start time:  07/13/2019 9:45 PM   Critical care end time:  07/13/2019 11:20 PM   Critical care time was exclusive of:  Separately billable procedures and treating other patients   Critical care was necessary to treat or prevent imminent or life-threatening deterioration of the following conditions:  Respiratory failure   Critical care was time spent personally by me on the following activities:  Blood draw for specimens, development of treatment plan with patient or surrogate, discussions with consultants, evaluation of patient's response to treatment, examination of patient, obtaining history from patient or surrogate, ordering and performing treatments and interventions, ordering and review of laboratory studies, pulse oximetry, re-evaluation of patient's condition, review of old charts and ordering and review of radiographic studies   (including critical care time)  Medications Ordered in ED Medications - No data to display   Initial Impression / Assessment and Plan / ED Course  I have reviewed the triage vital signs and the nursing notes.  Pertinent labs & imaging results that were available during my care of the patient were reviewed by me and considered in my medical decision making (see chart for details).  Clinical Course as of Jul 13 1017  Mon Jul 13, 2019  2124 No CHF or infiltrate, images reviewed by me  DG Chest 2 View [EW]  2323 Patient evaluated.  States she feels somewhat better.  She does continue to have wheezing in all lung fields.  COVID is negative.  Will give DuoNeb and reassess.  Anticipate she may be able to be discharged home.  She is concerned about her blood pressure.  She states that normally it is in the mid 54U systolic.  She states  "it is very high today."  Her most recent blood pressure is 103/80.  Patient reassured.   [CH]    Clinical Course User Index [CH] Horton, Barbette Hair, MD [EW] Daleen Bo, MD        Patient Vitals for the past 24 hrs:  BP Temp Temp src Pulse Resp SpO2 Height Weight  07/13/19 2050 (!) 164/110 - - 83 (!) 22 94 % - -  07/13/19 1339 - - - - - - 5\' 6"  (1.676 m) 62.6 kg  07/13/19 1338 - - - - - 94 % - -  07/13/19 1333 (!) 123/93 97.7 F (36.5 C) Oral 85 20 94 % - -    Medical Decision Making: She presents with signs and symptoms of COPD exacerbation, possibly bronchitis.  Clinically doubt Covid-19 infection.  Initial treatment, steroids, magnesium, high dose albuterol inhaler.  Chest x-ray does not indicate CHF or pneumonia.  Patient may require admission.  Screening Covid-19 test ordered.  Doubt severe sepsis, metabolic instability or impending vascular collapse.   [name] was evaluated in Emergency Department on 07/13/2019 for the symptoms described in the history of present illness. She was evaluated in the context of the global COVID-19 pandemic, which necessitated consideration that the patient might be at risk for infection with the SARS-CoV-2 virus that causes COVID-19. Institutional protocols and algorithms that pertain to the evaluation of patients at risk for COVID-19 are in a state of rapid change based on information released by regulatory bodies including the CDC and federal and state organizations. These policies and algorithms were followed during the patient's care in the ED.  CRITICAL CARE-yes Performed by: Daleen Bo  Nursing Notes Reviewed/ Care Coordinated Applicable Imaging Reviewed Interpretation of Laboratory Data incorporated into ED treatment   Care to oncoming provider team to evaluate after completion of treatment and arrange appropriate disposition.  Final Clinical Impressions(s) / ED Diagnoses   Final diagnoses:  COPD exacerbation [facility])    ED  Discharge Orders    None       Daleen Bo, MD 07/14/19 1023

## 2019-07-13 NOTE — Telephone Encounter (Signed)
Will monitor for ED arrival.  

## 2019-07-14 ENCOUNTER — Telehealth: Payer: Self-pay | Admitting: *Deleted

## 2019-07-14 MED ORDER — DOXYCYCLINE HYCLATE 100 MG PO CAPS: 100.0000 mg | ORAL_CAPSULE | Freq: Two times a day (BID) | ORAL | 0 refills | Status: DC

## 2019-07-14 MED ORDER — PREDNISONE 20 MG PO TABS: 20.0000 mg | ORAL_TABLET | Freq: Every day | ORAL | 0 refills | Status: DC

## 2019-07-14 NOTE — Telephone Encounter (Signed)
Elmyra Ricks, RN  from [facility] reports patient BP 74/51 recheck was 90/60. BP is low in the morning.  Patient went to ED yesterday  and BP reading 179/102.  Patient is complaining of cramping in toes. O2 is 88-89% today on RA. Patient reports she is only suppose to wear O2 at night. Patient reported to Elmyra Ricks she does not want to go back to ED since they did not address BP nor did a CXR. Please advise

## 2019-07-14 NOTE — ED Provider Notes (Signed)
Patient signed out pending treatment and lab work.  Lab work-up is reassuring.  COVID testing is negative.  Chest x-ray shows no evidence of pneumothorax or pneumonia.  On repeat exam, she continues to wheeze but feels much better.  She was given a DuoNeb.  After her DuoNeb O2 sats 98%.  Patient is concerned that she has increased blood pressure here.  I have reviewed her blood pressures.  Initially they were slightly elevated but have trended down nicely.  She is asymptomatic otherwise.  Recommend follow-up with her primary physician.  Will discharge with prednisone and doxycycline for COPD exacerbation.  [name] was evaluated in Emergency Department on 07/14/2019 for the symptoms described in the history of present illness. She was evaluated in the context of the global COVID-19 pandemic, which necessitated consideration that the patient might be at risk for infection with the SARS-CoV-2 virus that causes COVID-19. Institutional protocols and algorithms that pertain to the evaluation of patients at risk for COVID-19 are in a state of rapid change based on information released by regulatory bodies including the CDC and federal and state organizations. These policies and algorithms were followed during the patient's care in the ED.   Clinical Course as of Jul 13 14  Mon Jul 13, 2019  2124 No CHF or infiltrate, images reviewed by me  DG Chest 2 View [EW]  2323 Patient evaluated.  States she feels somewhat better.  She does continue to have wheezing in all lung fields.  COVID is negative.  Will give DuoNeb and reassess.  Anticipate she may be able to be discharged home.  She is concerned about her blood pressure.  She states that normally it is in the mid 70V systolic.  She states "it is very high today."  Her most recent blood pressure is 103/80.  Patient reassured.   [CH]    Clinical Course User Index [CH] Mikaya Bunner, Barbette Hair, MD [EW] Daleen Bo, MD   Problem List Items Addressed This Visit     None    Visit Diagnoses    COPD exacerbation ([place])    -  Primary   Relevant Medications   methylPREDNISolone sodium succinate (SOLU-MEDROL) 125 mg/2 mL injection 125 mg (Completed)   albuterol (VENTOLIN HFA) 108 (90 Base) MCG/ACT inhaler 8 puff (Completed)   ipratropium-albuterol (DUONEB) 0.5-2.5 (3) MG/3ML nebulizer solution 3 mL (Completed)   predniSONE (DELTASONE) 20 MG tablet        Merryl Hacker, MD 07/14/19 [phone]

## 2019-07-14 NOTE — Telephone Encounter (Signed)
Spoke with patient. Informed patient of Dr. Doug Sou message below. Patient verbalized understanding. Patient verbalized she will continue to monitor her BP and O2 levels and will seek medical attention if she gets worse.

## 2019-07-14 NOTE — Discharge Instructions (Addendum)
You were seen today and likely have a COPD exacerbation.  Continue nebulizers at home.  Take prednisone and doxycycline.   Regarding your blood pressure, your blood pressure trended down nicely while in the emergency department.  Monitor closely.  Sometimes blood pressure can increase when you are seen in a medical setting.  Keep a documentation of your blood pressures and follow-up with your primary physician.

## 2019-07-14 NOTE — Telephone Encounter (Signed)
I think I already reviewed lab results and gave recommendations. K+ needed to be repeated because it was elevated but it seems like she was in the ER yesterday for COPD exacerbation and K+ was in normal range.  Thanks, BJ

## 2019-07-14 NOTE — Telephone Encounter (Signed)
Continue monitoring BP. Prednisone can cause BP elevation. Recommend adequate hydration and fall prevention.  In regards to recent ER visit, provider might have not deemed necessary to have CXR  based on examination and the fact that she was started on antibiotic to cover for pneumonia,Doxycycline.  Continue O2 supplementation to keep O2 sats >89%. If respiratory distress she should seek immediate medication attention.  Thanks, BJ

## 2019-07-15 NOTE — Telephone Encounter (Signed)
Elmyra Ricks with [employer] calling to report she spoke with pt this morning and she did not sound good.  Her bp was 91/53, O2 was 84.  Pt is not wearing her oxygen. Pt was SOB, coughing and reported achy.  Nicole called 911.  EMS arrived while she was on the phone.  Elmyra RicksGavin Pound ext [phone]

## 2019-07-15 NOTE — Telephone Encounter (Signed)
Spoke with patient and she stated that EMS did come out this morning, her B/p was 130/60 and pulse was 94. Patient stated she feels much better than she has in a couple of days, she used her breathing treatment and is currently wearing her oxygen. Patient wanted to schedule a follow-up, needed afternoon because she said her breathing is worse in the mornings. Patient scheduled for Tuesday.

## 2019-07-21 ENCOUNTER — Encounter: Payer: Self-pay | Admitting: Family Medicine

## 2019-07-21 ENCOUNTER — Other Ambulatory Visit: Payer: Self-pay

## 2019-07-21 ENCOUNTER — Ambulatory Visit (INDEPENDENT_AMBULATORY_CARE_PROVIDER_SITE_OTHER): Payer: Medicare Other | Admitting: Family Medicine

## 2019-07-21 VITALS — BP 104/60 | HR 88 | Temp 97.8°F | Resp 20 | Ht 66.0 in | Wt 146.1 lb

## 2019-07-21 DIAGNOSIS — J9611 Chronic respiratory failure with hypoxia: Secondary | ICD-10-CM | POA: Diagnosis not present

## 2019-07-21 DIAGNOSIS — J441 Chronic obstructive pulmonary disease with (acute) exacerbation: Secondary | ICD-10-CM | POA: Diagnosis not present

## 2019-07-21 DIAGNOSIS — F419 Anxiety disorder, unspecified: Secondary | ICD-10-CM

## 2019-07-21 DIAGNOSIS — I959 Hypotension, unspecified: Secondary | ICD-10-CM | POA: Diagnosis not present

## 2019-07-21 NOTE — Progress Notes (Signed)
HPI:   [name] is a 73 y.o. female, who is here today to follow on recent ER visit.  She presented to the ER on 07/13/2019 because of low oxygen sats, 2 days of productive cough with whitish sputum, worsening dyspnea, and hypotension.  She was treated for COPD exacerbation with Solu-Medrol and DuoNeb. She was discharged on prednisone, doxycycline, and albuterol.  CXR 07/13/19: Negative for acute cardiopulmonary process.Emphysema.  History of COPD, she is on supplemental O2, 2 LPM, using continuously now. O2 sats at home 88-94%. Dry mouth, drinking water "all day." Denies orthopnea or PND.  She follows with pulmonologist, Dr. Chase Caller.  Currently she is on DuoNeb 3 times daily as needed and Trelegy Ellipta 100-62.5-25 mcg 1 puff daily. She has not noted much improvement with prednisone and DuoNeb. She is also on Daliresp 500 mg daily.  Symptoms seem to be worse in the morning and better after DuoNeb treatment. She has not noted fever, chills.  Lab Results  Component Value Date   WBC 4.5 07/13/2019   HGB 15.7 (H) 07/13/2019   HCT 47.4 (H) 07/13/2019   MCV 91.0 07/13/2019   PLT 325 07/13/2019   Hypokalemia resolved.  Lab Results  Component Value Date   CREATININE 0.62 07/13/2019   BUN 12 07/13/2019   NA 137 07/13/2019   K 4.1 07/13/2019   CL 103 07/13/2019   CO2 25 07/13/2019   BP running low she denies dizziness, chest pain, diaphoresis, or palpitations. She has been taking "a lot of Advil" for general aches and pains.  Anxiety: She is on Lorazepam 2 mg at bedtime and Trazodone 50 mg daily. She feels like anxiety may be aggravating respiratory symptoms. Denies depressed mood.  She has taken medication for years.   Review of Systems  Constitutional: Positive for fatigue. Negative for activity change, appetite change and unexpected weight change.  HENT: Negative for mouth sores, nosebleeds and sore throat.   Eyes: Negative for redness and  visual disturbance.  Respiratory: Positive for cough, shortness of breath and wheezing.   Cardiovascular: Negative for leg swelling.  Gastrointestinal: Negative for abdominal pain, nausea and vomiting.       Negative for changes in bowel habits.  Genitourinary: Negative for decreased urine volume, dysuria and hematuria.  Musculoskeletal: Positive for arthralgias and back pain.  Neurological: Negative for syncope, weakness and headaches.  Psychiatric/Behavioral: Negative for confusion. The patient is nervous/anxious.   Rest see pertinent positives and negatives per HPI.   Current Outpatient Medications on File Prior to Visit  Medication Sig Dispense Refill  . albuterol (PROVENTIL HFA;VENTOLIN HFA) 108 (90 Base) MCG/ACT inhaler Inhale 2 puffs into the lungs every 4 (four) hours as needed for wheezing. As a RESCUE medication 1 Inhaler 1  . amitriptyline (ELAVIL) 50 MG tablet TAKE 1 TABLET BY MOUTH EVERYDAY AT BEDTIME 90 tablet 1  . aspirin 81 MG tablet Take 81 mg by mouth every evening.     Marland Kitchen atorvastatin (LIPITOR) 80 MG tablet TAKE 1 TABLET BY MOUTH EVERY DAY (Patient taking differently: Take 80 mg by mouth at bedtime. ) 90 tablet 3  . Biotin 1000 MCG tablet Take 1,000 mcg by mouth daily.    . cilostazol (PLETAL) 100 MG tablet TAKE 1 TABLET BY MOUTH TWICE A DAY (Patient taking differently: Take 100 mg by mouth 2 (two) times daily. ) 180 tablet 2  . dexlansoprazole (DEXILANT) 60 MG capsule Take 1 capsule (60 mg total) by mouth daily. 90 capsule  1  . docusate sodium (COLACE) 100 MG capsule Take 100 mg by mouth at bedtime.    Marland Kitchen doxycycline (VIBRAMYCIN) 100 MG capsule Take 1 capsule (100 mg total) by mouth 2 (two) times daily. 20 capsule 0  . ezetimibe (ZETIA) 10 MG tablet TAKE 1 TABLET BY MOUTH EVERY DAY 90 tablet 3  . famotidine (PEPCID) 20 MG tablet TAKE 1 TABLET BY MOUTH TWICE A DAY (Patient taking differently: Take 20 mg by mouth 2 (two) times daily. ) 180 tablet 1  .  Fluticasone-Umeclidin-Vilant (TRELEGY ELLIPTA) 100-62.5-25 MCG/INH AEPB Inhale 1 puff into the lungs daily. 60 each 3  . guaiFENesin (MUCINEX) 600 MG 12 hr tablet Take 600 mg by mouth 2 (two) times daily as needed for cough or to loosen phlegm.     Marland Kitchen ipratropium-albuterol (DUONEB) 0.5-2.5 (3) MG/3ML SOLN USE 1 NEB 3 TIMES DAILY FOR 3 DAYS, THEN EVERY 6 HOURS AS NEEDED. (Patient taking differently: Inhale 3 mLs into the lungs every 6 (six) hours as needed (sob, wheezing). ) 360 mL 0  . levothyroxine (SYNTHROID) 112 MCG tablet TAKE 1 TABLET (112 MCG TOTAL) BY MOUTH DAILY BEFORE BREAKFAST. (Patient taking differently: Take 112 mcg by mouth at bedtime. ) 90 tablet 1  . LORazepam (ATIVAN) 2 MG tablet TAKE 1 TABLET BY MOUTH AT BEDTIME AS NEEDED FOR ANXIETY (Patient taking differently: Take 2 mg by mouth at bedtime. ) 30 tablet 3  . omeprazole (PRILOSEC) 40 MG capsule TAKE 1 CAPSULE BY MOUTH TWICE A DAY (Patient taking differently: Take 40 mg by mouth 2 (two) times daily. ) 180 capsule 1  . predniSONE (DELTASONE) 20 MG tablet Take 1 tablet (20 mg total) by mouth daily. 10 tablet 0  . roflumilast (DALIRESP) 500 MCG TABS tablet Take 1 tablet (500 mcg total) by mouth daily. 30 tablet 11  . traZODone (DESYREL) 50 MG tablet TAKE 1/2 TO 1 TABLET BY MOUTH AT BEDTIME AS NEEDED FOR SLEEP (Patient taking differently: Take 50 mg by mouth at bedtime. ) 90 tablet 2  . varenicline (CHANTIX STARTING MONTH PAK) 0.5 MG X 11 & 1 MG X 42 tablet Take one 0.5 mg tablet by mouth once daily for 3 days, then increase to one 0.5 mg tablet twice daily for 4 days, then increase to one 1 mg tablet twice daily. 53 tablet 0   No current facility-administered medications on file prior to visit.      Past Medical History:  Diagnosis Date  . Bilateral leg cramps   . Bladder neoplasm   . 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 COPD, frequent exacerbations ([place])     03-06-2018 per pt last exacerbation 12/ 2018  . Coronary artery disease    cardiologist-  dr Aundra Dubin-- 03-11-2018 er cath , 80% stenosis in the ostial second diagonal  . Dyspnea on minimal exertion   . Emphysema/COPD ([place])    CAT score- 17  . Family history of breast cancer   . Family history of colon cancer   . Family history of melanoma   . Family history of ovarian cancer   . Family history of pancreatic cancer   . Fibromyalgia 1995  . GERD (gastroesophageal reflux disease)   . Hiatal hernia   . History of breast cancer   . History of diverticulitis of colon    2002  s/p  sigmoid colectomy  . History of multiple pulmonary nodules  hx RUL nodules x2  s/p right VATS w/ wedge resection 09-11-2005 and 06-14-2011  both  necrotizing granulomatous inflammation w/ cystic area of necrosis & focal calcification  . History of right breast cancer oncologist-  dr Jana Hakim-- no recurrence   dx 2010--  IDC, Stage IA , ER/PR+,  (pT1c pN0) 11-09-2008 right lumpectomy;  12-18-2008 right simple mastectomy for DCIS margins;  completed chemotherapy 2010 (no radiation) and completed antiestrogen therapy  . Hx of colonic polyps    Dr. Cristina Gong -last study '11  . Hyperlipidemia   . Hypertension   . Hypothyroidism   . 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 Nocturia   . OA (osteoarthritis)    thumbs  . On supplemental oxygen therapy    03-06-2018 per pt uses only at night,  checks O2 sats at home,  stated am sat 89% after moving around average 93-94% with RA  . Osteoporosis   . Peripheral arterial occlusive disease [facility]) vascular-- dr chen/ dr Donzetta Matters   proximal right SFA severe focal stenosis with collaterals from the PFA/  04/ 2012 occluded celiac and SMA arteries with distal reconstitution w/ patent IMA  . Peripheral vascular disease ([place])    chronic DVT RLE,  mesenteric vascular disease  . Right lower lobe pulmonary nodule    Chest CT 08-29-2017  . Stage 3 severe  COPD by GOLD classification ([place]) hx frequent exacerbations--   pulmologist-  dr Maryann Alar--  per lov note , dated 12-20-2017, oxyogen 2L is prescribed for use with exertion (but pt only uses mostly at night), O2 sats on RA run the 80s , this day sat 86% RA and with 2L O2 sat 92%  . Varicose veins of leg with swelling    varicose vein surgery - Dr. Aleda Grana  . Wears glasses   . Wears hearing aid in both ears    Allergies  Allergen Reactions  . Sulfa Antibiotics Swelling    Social History   Socioeconomic History  . Marital status: Married    Spouse name: Not on file  . Number of children: 3  . Years of education: 89  . Highest education level: Not on file  Occupational History  . 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 Financial resource strain: Not on file  . Food insecurity    Worry: Not on file    Inability: Not on file  . Transportation needs    Medical: Not on file    Non-medical: Not on file  Tobacco Use  . Smoking status: Former Smoker    Packs/day: 0.75    Years: 50.00    Pack years: 37.50    Types: Cigarettes    Quit date: 05/18/2016    Years since quitting: 3.1  . Smokeless tobacco: Never Used  Substance and Sexual Activity  . Alcohol use: Yes    Alcohol/week: 10.0 standard drinks    Types: 10 Cans of beer per week  . Drug use: No  . Sexual activity: Not on file  Lifestyle  . Physical activity    Days per week: Not on file    Minutes per session: Not on file  . Stress: Not on file  Relationships  . Social Herbalist on phone: Not on file    Gets together: Not on file    Attends religious service: Not on file    Active member of club or  organization: Not on file    Attends meetings of clubs or organizations: Not on file    Relationship status: Not on file  Other Topics Concern  . Not on file  Social History Narrative   HSG, beauty school. Married '69 -75 yrs/widowed; married '79 - 24yr/divorced;  married '87.  2 sons - '70, '74, 1 srtep-son; 1 grandchild. 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 SO - good health.  NO history of abuse.  ACP - full code and all heroic measures.     Vitals:   07/21/19 1355  BP: 104/60  Pulse: 88  Resp: 20  Temp: 97.8 F (36.6 C)  SpO2: 93%   Body mass index is 23.59 kg/m.   Physical Exam  Nursing note and vitals reviewed. Constitutional: She is oriented to person, place, and time. She appears well-developed. No distress.  HENT:  Head: Normocephalic and atraumatic.  Mouth/Throat: Oropharynx is clear and moist. Mucous membranes are dry.  Eyes: Pupils are equal, round, and reactive to light. Conjunctivae are normal.  Cardiovascular: Normal rate and regular rhythm.  No murmur heard. Pulses:      Dorsalis pedis pulses are 2+ on the right side and 2+ on the left side.  Respiratory: Effort normal and breath sounds normal. No respiratory distress.  On supplemental O2 2 LPM per [place].  GI: Soft. There is no abdominal tenderness.  Musculoskeletal:        General: No edema.  Lymphadenopathy:    She has no cervical adenopathy.  Neurological: She is alert and oriented to person, place, and time. She has normal strength. No cranial nerve deficit.  Skin: Skin is warm. No rash noted. No erythema.  Psychiatric: Her mood appears anxious.  Well groomed, good eye contact.    ASSESSMENT AND PLAN:  Ms. Gracyn was seen today for hospitalization follow-up.  Diagnoses and all orders for this visit:  COPD, frequent exacerbations ([place]) Problem seems to be getting worse overtime. Complete abx and prednisone treatment. We discussed symptoms and prognosis. No changes in current management. Follow with pulmonologist.  Chronic respiratory failure with hypoxia ([place]) She is not in acute respiratory distress at this time. Continue O2 supplementation 2 LPM. O2 goal >=89%. Instructed about warning signs.  Anxiety disorder, unspecified type Problem could be  aggravating feeling of SOB and vice versa.  We discussed side effects on benzodiazepine on respiratory dive among others. No changes in current management.  Hypotension, unspecified hypotension type Mild. Adequate hydration and normal salt diet.  Instructed about warning signs.  We discussed side effects of chronic NSAID's use. Recommend Tylenol instead, 500 mg tid prn.   Return in about 4 months (around 11/21/2019) for anxiety.      G. Martinique, MD  [facility]. [place] office.

## 2019-07-21 NOTE — Patient Instructions (Addendum)
A few things to remember from today's visit:   COPD, frequent exacerbations ([place])  Chronic respiratory failure with hypoxia (HCC)  Anxiety disorder, unspecified type  Lab Results  Component Value Date   CREATININE 0.62 07/13/2019   BUN 12 07/13/2019   NA 137 07/13/2019   K 4.1 07/13/2019   CL 103 07/13/2019   CO2 25 07/13/2019    No changes in treatment. Complete antibiotic and prednisone treatment. Keep appointment with your pulmonologist.  Please be sure medication list is accurate. If a new problem present, please set up appointment sooner than planned today.

## 2019-07-22 ENCOUNTER — Other Ambulatory Visit: Payer: Self-pay | Admitting: Family Medicine

## 2019-07-30 ENCOUNTER — Ambulatory Visit (INDEPENDENT_AMBULATORY_CARE_PROVIDER_SITE_OTHER): Payer: Medicare Other | Admitting: Internal Medicine

## 2019-07-30 DIAGNOSIS — R0600 Dyspnea, unspecified: Secondary | ICD-10-CM | POA: Diagnosis not present

## 2019-07-30 DIAGNOSIS — J441 Chronic obstructive pulmonary disease with (acute) exacerbation: Secondary | ICD-10-CM

## 2019-07-30 LAB — TROPONIN I (HIGH SENSITIVITY): High Sens Troponin I: 13 ng/L (ref 2–17)

## 2019-07-30 LAB — D-DIMER, QUANTITATIVE: D-Dimer, Quant: 0.43 mcg/mL FEU (ref <0.50)

## 2019-07-30 MED ORDER — CEPHALEXIN 500 MG PO CAPS: 500.0000 mg | ORAL_CAPSULE | Freq: Three times a day (TID) | ORAL | 0 refills | Status: DC

## 2019-07-30 MED ORDER — PREDNISONE 10 MG PO TABS: ORAL_TABLET | ORAL | 0 refills | Status: DC

## 2019-07-30 NOTE — Patient Instructions (Addendum)
Dyspnea, unspecified type COPD, frequent exacerbations ([place])    Plan  - cephalexin 500mg  three times daily x  5 days - Please take Take prednisone 40mg  once daily x 3 days, then 30mg  once daily x 3 days, then 20mg  once daily x 3 days, then prednisone 10mg  once daily  x 3 days and then baseline dose - do 2d echo when possible next week or so  - do blood d-dimer and troponin 07/30/2019  - will call with results   Followup  - next 1-2 weeks but after completing above

## 2019-07-30 NOTE — Addendum Note (Signed)
Addended by: Amado Coe on: 07/30/2019 03:40 PM   Modules accepted: Orders

## 2019-07-30 NOTE — Addendum Note (Signed)
Addended by: Suzzanne Cloud E on: 07/30/2019 03:15 PM   Modules accepted: Orders

## 2019-07-30 NOTE — Addendum Note (Signed)
Addended by: Suzzanne Cloud E on: 07/30/2019 03:41 PM   Modules accepted: Orders

## 2019-07-30 NOTE — Addendum Note (Signed)
Addended by: Collier Salina on: 07/30/2019 10:00 AM   Modules accepted: Orders

## 2019-07-30 NOTE — Addendum Note (Signed)
Addended by: Suzzanne Cloud E on: 07/30/2019 03:30 PM   Modules accepted: Orders

## 2019-07-30 NOTE — Progress Notes (Addendum)
Her      PCP Hoyt Koch, MD  OV 12/31/2016  Chief Complaint  Patient presents with   Follow-up    Pt states her breahting is doing well. Pt denies cough and CP/tightness. Pt c/o acid reflux.      Follow-up Gold stage III COPD  She has recurrent exacerbation. Most recently was over a month ago when she was hospitalized. She currently feels baseline. She feels good. She is not on oxygen therapy. Reviewed her medication list and shows that she is only on Advair as control therapy for COPD. She has no idea why she is not on an anticholinergic. DuoNeb was listed but she says she only takes it sparingly as needed and since her discharge is not needing it. She has normal work and anticholinergic is. She also tells me that because of her eosinophilia and elevated IgE and seasonal allergies wants a referral to allergy clinic. She is seeing Dr. Allyson Sabal for her skin issues and she is feeling better.         Results for [name], [name] (MRN [id number]) as of 12/31/2016 12:33  Ref. Range 07/04/2016 05:26 07/05/2016 03:23 07/10/2016 05:13 11/16/2016 13:20 11/17/2016 05:57  Eosinophils Absolute Latest Ref Range: 0.0 - 0.7 K/uL    1.6 (H) 0.0    Results for [name], [name] (MRN [id number]) as of 12/31/2016 12:33  Ref. Range 11/16/2016 13:20  IgE (Immunoglobulin E), Serum Latest Ref Range: 0 - 100 IU/mL 180 (H)    OV 02/26/2017  Chief Complaint  Patient presents with   Follow-up    FOLLOW UP FOR patient states that she is here for SOB and her voice is horse and she has wheezing all the time patient denies c/p or tightness     Follow-up Gold stage III COPD: She is on triple inhaler therapy with Spiriva and Advair. Has COPD stable. She has elevated IgE and eosinophilia. She has seen allergist and then got referred to Dr. Lucia Gaskins ENT who apparently treated her for pharyngeal or laryngeal thrush and this has helped. She also has skin rash. I referred her to Dr. Allyson Sabal and she's tried some prednisone course  and has had skin biopsy with these things have not helped. Nevertheless she feels she is under good care. Overall in terms of COPD she stable but she does have significant cough and is a history of frequent exacerbations. But she does not think she is an exacerbation today  The new issue appears to be that she has last few days she has right lower extremity swelling greater than the left. There is some associated warmth    03/07/2017 Follow UP OV for Chronic DVT:  Pt. Was seen by Dr. Chase Caller 02/26/2017 for leg swelling. He was concerned about possibility of acute DVT and ordered a doppler study. The patient did not have any chest pain or shortness of breath at the time. 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 Dr. Chase Caller chose not to treat as  this is a chronic problem. He wanted the patient to be diligent with her ASA daily.The patient returns today for follow up. She continues to have no respiratory issues. Her COPD is stable and at baseline. She does  not have any chest pain, or tachycardia. She states her baseline dyspnea that she experiences with her COPD  is unchanged. She continues to have issues with bilateral leg swelling.She is not wearing her oxygen with exertion as prescribed . She states she is wearing  it at night. We did discuss the additional strain  placed on the heart when oxygen levels fall below 88%, and the importance of compliance with oxygen on exertion. Most recent hospital admission for COPD exacerbation 11/2016. She was prophylaxed with heparin for DVT prophylaxis at that time.    OV 06/04/2017  Chief Complaint  Patient presents with   Follow-up    Pt states her SOB has recently worsened x 1 week. Pt c/o prod cough with opaque colored mucus. Pt denies CP/tightness and f/c/s.    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 Here with her husband. She initially  told me that she was stable and doing well without exacerbations but has a continued to talk to her she initially told me that she's been coughing more than usual for past one day but then has been interjected saying she's been coughing more than usual for a few weeks. She agreed to this. She admitted that the sputum is a little bit more discolored than baseline. She is also more short of breath and wheezing more and coughing more than baseline.  Her recent months have been complicated by chronic DVT finding for which she says she is seeing vascular surgery Dr. Adele Barthel and his been placed on observation therapy according to history. She's also had cellulitis issues with her feet. She showed me treatment medication of clindamycin and cephalexin at [facility] and was to hospital in the middle of June 2018. She says for the last few months she's been having excessive hair loss and she's going to see dermatology for this. She's wondering if antibiotic and prednisone and contributing to hair loss. At the same time she is worried about recurrent COPD exacerbation and leaving it untreated.      07/15/2017 Acute OV:  [name] is a 73 y.o. [name] former smoker with COPD Gold III and history of breast cancer.She is followed by Dr. Chase Caller.  Pt. Presents for acute visit with a 2 week history of worsening shortness of breath, cough, increased wheezing. She endorses tightness in her chest. She is coughing up clear foamy white secretions. No significant increase in amount of secretions.She states she has not had a fever. She is compliant with her Advair and Spiriva daily. She prefers Trelegy, but cannot afford it. She is compliant with her Duonebs,  Mucinex, Flonase and has had increased use in frequency of need for rescue inhaler. She is currently on oxygen nocturnally. She has been checking her saturations, and has found that her saturations during the day have dropped as low as the 70's. She was at  86% today. We have placed her on oxygen at 2 L . She has rebounded to 92%.She denies fever, chest pain, or hemoptysis. She states she has been sleeping propped on 2 pillows for months.  Test Results:   OV 09/09/2017  Chief Complaint  Patient presents with   Follow-up    Pt states that she has been doing good. States that she started coughing with thick mucus x1 week ago. Also states that she has SOB with exertion. Denies any CP.    Gold stage III COPD with a history of breast cancer.  She has frequent exacerbations.  Last August 2018.  Then after that in October 2018 again had an exacerbation and so my nurse practitioner.  She tells me that she is taking different inhalers because of cost issues  although her best preferred inhalers trelegy..  Since that she has quit smoking.  She is very worried about her hair loss and has an appoint with Dr. Allyson Sabal.  She says that she saw a dermatologist at [facility] and was recommended being in a tanning bed for 30 days but this is not helped.  She is looking forward to seeing Dr. Allyson Sabal.  Currently feels COPD stable with a COPD CAT score of 17 but she does have mild for secretory wheeze.  She is extremely interested in [place] for prevention of COPD exacerbations.  OV 12/20/2017  Chief Complaint  Patient presents with   Follow-up    copd no SOB, no wheezing, no chest tightness   Severe copd = MM Phenotype  Gold stage III COPD with history of breast cancer.  She has frequent exacerbations.  Last seen Thanksgiving 2018.  At that time I started her on Daliresp.  Since then she is only had one exacerbation.  I thought this was all because of Daliresp that her exacerbation frequency is reduced but today she tells me that she did take the first set of the drug and she liked it and it helped but then it was expensive and she has not really taken the medication.  She did see Dr. Melvyn Novas my colleague in the interim and was given prednisone for another exacerbation.   COPD CAT score shows continued stability.  Overall there are no new problems.   Results for [name], [name] (MRN [id number]) as of 12/20/2017 13:00  Ref. Range 08/16/2016 14:35 10/26/2016 07:04  FEV1-Post Latest Units: L 1.37 1.03  FEV1-%Pred-Post Latest Units: % 56 44        OV 06/20/2018  Subjective:  Patient ID: [name], [name] , DOB: 05-Oct-1946 , age 28 y.o. , MRN: [id number] , ADDRESS: [place] [address]   06/20/2018 -   Chief Complaint  Patient presents with   Follow-up    Pt states she has been doing well since last visit and denies any complaints.     HPI [name] 73 y.o. - presents for follow-up of stage III Gold COPD. COPD cat score is 17 and same as baseline.she is oncology inhaler. With this her cough is improved but the phlegm is the same and chest tightness is the same. Issues dyspnea on the fatigue is improved.there are no other new issues  Smoking: This is relapsed. She wants to take Chantix. She does have firearms in the house but she has never used them. There are no suicidal or homicidal tendencies. She has used Chantix in the past with good success so she wants this medicine back.  Past medical history: In the interim is been diagnosed with bladder cancer and is on treatment       OV 07/30/2019  Subjective:  Patient ID: [name], [name] , DOB: [birth year] , age 39 y.o. , MRN: [id number] , ADDRESS: [place] [address]   07/30/2019 -   Chief Complaint  Patient presents with   Follow-up    Pt c/o increase in SOB X 7 weeks, progressively worsened. Pt c/o sinus congestion, body aches, headache. Pt denies cough, CP/tightness, f/c/s, denied discolored mucus. Pt went to ED in late September and was given abx and pred. Pt states neither medication helped her breathing.     HPI [name] 73 y.o. - telephone visit. Risks, benefits and limitations of visit explained. Gold stage 3 copd   SOn  died 06/22/2019 And  then with stress had sinus issues and this descended down into lungs with myalgia. Then went int o ER around 07/13/2019 - clear CXR. Sent home from ER - with doxy and prednisone but not help. Followed by PCP [place], [name], MD 07/21/2019 - and was told lungs clear and was asdvised to call me. Says currently Class 3-4 sob, wheezing, feels like she will die. Very anxious. On ativan. Using o2 now 24/7 (prior just at night). COPD meds are duoneb, trelegy (though currently some yellow with orange)  dalriresp, and prednisone. COVID negative 07/13/2019.  In ER visit no evidence of troponin, d-dimer check . CXR 07/13/2019 - clear but with emp[hysema.  Last CT Feb 2020 with resolving nodule and emphysema. Last echo 2017         CAT COPD Symptom & Quality of Life Score ([place]) 0 is no burden. 5 is highest burden 09/09/2017  06/20/2018   Never Cough -> Cough all the time 3 0  No phlegm in chest -> Chest is full of phlegm 3 4  No chest tightness -> Chest feels very tight 3 3  No dyspnea for 1 flight stairs/hill -> Very dyspneic for 1 flight of stairs 0 4  No limitations for ADL at home -> Very limited with ADL at home 0 1  Confident leaving home -> Not at all confident leaving home 0 0  Sleep soundly -> Do not sleep soundly because of lung condition 5 5  Lots of Energy -> No energy at all 3 0  TOTAL Score (max 40)  17 17   IMPRESSION: 1. Previously seen right lower lobe pulmonary nodules have resolved. These were likely inflammatory. 2. Stable nodular disease along the left major fissure. 3.  Aortic Atherosclerosis (ICD10-I70.0). 4.  Emphysema (ICD10-J43.9).   Electronically Signed   By: San Morelle M.D.   On: 11/18/2018 09:00 ROS - per HPI     has a past medical history of Bilateral leg cramps, Bladder neoplasm, Chronic deep vein thrombosis (DVT) of right lower extremity ([place]), COPD, frequent exacerbations ([place]), Coronary artery disease, Dyspnea on minimal exertion,  Emphysema/COPD ([place]), Family history of breast cancer, Family history of colon cancer, Family history of melanoma, Family history of ovarian cancer, Family history of pancreatic cancer, Fibromyalgia (1995), GERD (gastroesophageal reflux disease), Hiatal hernia, History of breast cancer, History of diverticulitis of colon, History of multiple pulmonary nodules, History of right breast cancer (oncologist-  dr Jana Hakim-- no recurrence), colonic polyps, Hyperlipidemia, Hypertension, Hypothyroidism, 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   reports that she quit smoking about 3 years ago. Her smoking use included cigarettes. She has a 37.50 pack-year smoking history. She has never used smokeless tobacco.  Past Surgical History:  Procedure Laterality Date   BREAST EXCISIONAL BIOPSY Left 10/15/2008   BREAST LUMPECTOMY W/ NEEDLE LOCALIZATION Right 11-09-2008    dr cornett   [place]  03-12-2011   dr Aundra Dubin   70-80% ostial stenosis in small second diagonal (appears to small for intervention),  dLAD 40-50%,  minimal luminal irregulartieis involoving LCFx and RCA,  LVEF 55%   CATARACT EXTRACTION W/ INTRAOCULAR LENS  IMPLANT, BILATERAL  2011  CYSTOSCOPY N/A 03/11/2018   Procedure: CYSTOSCOPY WITH INSTILLATION OF POST OPERATIVE EPIRUBICIN;  Surgeon: Festus Aloe, MD;  Location: [facility];  Service: Urology;  Laterality: N/A;   MASTECTOMY Right    PARTIAL COLECTOMY  2002   sigmoid and appectomy (diverticulitis)   PARTIAL MASTECTOMY WITH NEEDLE LOCALIZATION Left 02/23/2013   Procedure:  LEFT PARTIAL MASTECTOMY WITH  NEEDLE LOCALIZATION;  Surgeon: Adin Hector, MD;  Location: [place];  Service: General;  Laterality: Left;   RECONSTRUCTION BREAST W/ LATISSIMUS DORSI FLAP Right 05-30-2009   dr Harlow Mares  [facility]   REDUCTION MAMMAPLASTY Left    SIMPLE MASTECTOMY Right 12-28-2008    dr Brantley Stage  [facility]   TRANSTHORACIC ECHOCARDIOGRAM  07-09-2016  dr Aundra Dubin   ef 55-60%, grade 1 diastoic dysfunction/  mild TR   TRANSURETHRAL RESECTION OF BLADDER TUMOR N/A 03/11/2018   Procedure: TRANSURETHRAL RESECTION OF BLADDER TUMOR (TURBT) 2-5cm;  Surgeon: Festus Aloe, MD;  Location: [facility];  Service: Urology;  Laterality: N/A;   VAGINAL HYSTERECTOMY  1973   VIDEO ASSISTED THORACOSCOPY (VATS)/WEDGE RESECTION Right 09-11-2005  &  06-14-2011   dr Fara Boros  [facility]   both RUL    Allergies  Allergen Reactions   Sulfa Antibiotics Swelling    Immunization History  Administered Date(s) Administered   Influenza, High Dose Seasonal PF 10/03/2017, 06/20/2018   Influenza,inj,Quad PF,6+ Mos 08/10/2014, 08/12/2015, 07/03/2016   Pneumococcal Conjugate-13 08/10/2014   Pneumococcal Polysaccharide-23 08/12/2015   Td 10/16/2007   Zoster 11/09/2010    Family History  Problem Relation Age of Onset   Colon cancer Mother        colon   COPD Mother        brown lung   Hypertension Mother    Diabetes Mother    Emphysema Mother    Coronary artery disease Father    Heart attack Father    Sudden death Father    Heart disease Father    Cancer Sister        throat   Hypertension Sister    Coronary artery disease Brother    Heart attack Brother        early 66s   Throat cancer Sister    Ovarian cancer Sister        fallopian tube cancer in her 33s   Melanoma Sister    Hypertension Sister    Pancreatic cancer Sister    Melanoma Niece        dx in her 13s   Breast cancer Niece 66     Current Outpatient Medications:    albuterol (PROVENTIL HFA;VENTOLIN HFA) 108 (90 Base)  MCG/ACT inhaler, Inhale 2 puffs into the lungs every 4 (four) hours as needed for wheezing. As a RESCUE medication, Disp: 1 Inhaler, Rfl: 1   aspirin 81 MG tablet, Take 81 mg by mouth every evening. , Disp: , Rfl:    atorvastatin (LIPITOR) 80 MG tablet, TAKE 1 TABLET BY MOUTH EVERY DAY (Patient taking differently: Take 80 mg by mouth at bedtime. ), Disp: 90 tablet, Rfl: 3   Biotin 1000 MCG tablet, Take 1,000 mcg by mouth daily., Disp: , Rfl:    cilostazol (PLETAL) 100 MG tablet, TAKE 1 TABLET BY MOUTH TWICE A DAY (Patient taking differently: Take 100 mg by mouth 2 (two) times daily. ), Disp: 180 tablet, Rfl: 2   dexlansoprazole (DEXILANT) 60 MG capsule, Take 1 capsule (60 mg total) by mouth daily., Disp: 90 capsule, Rfl: 1   docusate sodium (COLACE) 100  MG capsule, Take 100 mg by mouth at bedtime., Disp: , Rfl:    famotidine (PEPCID) 20 MG tablet, TAKE 1 TABLET BY MOUTH TWICE A DAY (Patient taking differently: Take 20 mg by mouth 2 (two) times daily. ), Disp: 180 tablet, Rfl: 1   fluticasone (FLONASE) 50 MCG/ACT nasal spray, Place 1 spray into both nostrils 2 (two) times daily., Disp: 16 mL, Rfl: 1   Fluticasone-Umeclidin-Vilant (TRELEGY ELLIPTA) 100-62.5-25 MCG/INH AEPB, Inhale 1 puff into the lungs daily., Disp: 60 each, Rfl: 3   guaiFENesin (MUCINEX) 600 MG 12 hr tablet, Take 600 mg by mouth 2 (two) times daily as needed for cough or to loosen phlegm. , Disp: , Rfl:    ipratropium-albuterol (DUONEB) 0.5-2.5 (3) MG/3ML SOLN, USE 1 NEB 3 TIMES DAILY FOR 3 DAYS, THEN EVERY 6 HOURS AS NEEDED. (Patient taking differently: Inhale 3 mLs into the lungs every 6 (six) hours as needed (sob, wheezing). ), Disp: 360 mL, Rfl: 0   levothyroxine (SYNTHROID) 112 MCG tablet, TAKE 1 TABLET (112 MCG TOTAL) BY MOUTH DAILY BEFORE BREAKFAST. (Patient taking differently: Take 112 mcg by mouth at bedtime. ), Disp: 90 tablet, Rfl: 1   LORazepam (ATIVAN) 2 MG tablet, TAKE 1 TABLET BY MOUTH AT BEDTIME AS NEEDED FOR  ANXIETY (Patient taking differently: Take 2 mg by mouth at bedtime. ), Disp: 30 tablet, Rfl: 3   omeprazole (PRILOSEC) 40 MG capsule, TAKE 1 CAPSULE BY MOUTH TWICE A DAY (Patient taking differently: Take 40 mg by mouth 2 (two) times daily. ), Disp: 180 capsule, Rfl: 1   roflumilast (DALIRESP) 500 MCG TABS tablet, Take 1 tablet (500 mcg total) by mouth daily., Disp: 30 tablet, Rfl: 11   traZODone (DESYREL) 50 MG tablet, TAKE 1/2 TO 1 TABLET BY MOUTH AT BEDTIME AS NEEDED FOR SLEEP (Patient taking differently: Take 50 mg by mouth at bedtime. ), Disp: 90 tablet, Rfl: 2   amitriptyline (ELAVIL) 50 MG tablet, TAKE 1 TABLET BY MOUTH EVERYDAY AT BEDTIME (Patient not taking: Reported on 07/30/2019), Disp: 90 tablet, Rfl: 1   doxycycline (VIBRAMYCIN) 100 MG capsule, Take 1 capsule (100 mg total) by mouth 2 (two) times daily., Disp: 20 capsule, Rfl: 0   ezetimibe (ZETIA) 10 MG tablet, TAKE 1 TABLET BY MOUTH EVERY DAY (Patient not taking: Reported on 07/30/2019), Disp: 90 tablet, Rfl: 3   predniSONE (DELTASONE) 20 MG tablet, Take 1 tablet (20 mg total) by mouth daily., Disp: 10 tablet, Rfl: 0   varenicline (CHANTIX STARTING MONTH PAK) 0.5 MG X 11 & 1 MG X 42 tablet, Take one 0.5 mg tablet by mouth once daily for 3 days, then increase to one 0.5 mg tablet twice daily for 4 days, then increase to one 1 mg tablet twice daily. (Patient not taking: Reported on 07/30/2019), Disp: 53 tablet, Rfl: 0      Objective:   There were no vitals filed for this visit.  Estimated body mass index is 23.59 kg/m as calculated from the following:   Height as of 07/21/19: 5\' 6"  (1.676 m).   Weight as of 07/21/19: 146 lb 2 oz (66.3 kg).  @WEIGHTCHANGE @  There were no vitals filed for this visit.   Physical Exam  - telephone visit         Assessment:       ICD-10-CM   1. Dyspnea, unspecified type  R06.00   2. COPD, frequent exacerbations ([place])  J44.1        Plan:     Patient Instructions  Dyspnea,  unspecified type COPD, frequent exacerbations (HCC)    Plan  - cephalexin 500mg  three times daily x  5 days - Please take Take prednisone 40mg  once daily x 3 days, then 30mg  once daily x 3 days, then 20mg  once daily x 3 days, then prednisone 10mg  once daily  x 3 days and then baseline dose - do 2d echo when possible next week or so  - do blood d-dimer and troponin 07/30/2019  - will call with results   Followup  - next 1-2 weeks but after completing above   > 50% of this > 25 min visit spent in face to face counseling or coordination of care - by this undersigned MD - Dr Brand Males. This includes one or more of the following documented above: discussion of test results, diagnostic or treatment recommendations, prognosis, risks and benefits of management options, instructions, education, compliance or risk-factor reduction   SIGNATURE    Dr. Brand Males, M.D., F.C.C.P,  Pulmonary and Critical Care Medicine Staff Physician, [place] Director - Interstitial Lung Disease  Program  Pulmonary [place] [facility] [place], [place], [address]  Pager: [phone], If no answer or between  15:00h - 7:00h: call [phone] Telephone: [phone]  9:54 AM 07/30/2019

## 2019-07-31 ENCOUNTER — Telehealth: Payer: Self-pay | Admitting: Internal Medicine

## 2019-07-31 NOTE — Telephone Encounter (Signed)
ATC pt, received busy signal x2. Will try back. 

## 2019-07-31 NOTE — Telephone Encounter (Signed)
  Let [name] know that both d-dimer and troponin are normal.  She should proceed on getting an echocardiogram and return to the office for follow-up as advised on the office visit yesterday

## 2019-08-03 ENCOUNTER — Other Ambulatory Visit: Payer: Self-pay

## 2019-08-03 ENCOUNTER — Ambulatory Visit (HOSPITAL_COMMUNITY): Payer: Medicare Other | Attending: Cardiovascular Disease

## 2019-08-03 DIAGNOSIS — R0600 Dyspnea, unspecified: Secondary | ICD-10-CM | POA: Diagnosis present

## 2019-08-03 NOTE — Telephone Encounter (Signed)
Attempted to call patient, reached busy signal.

## 2019-08-05 NOTE — Telephone Encounter (Signed)
ATC, her line is still busy  I am going to close the encounter, as she has already completed her ECHO

## 2019-08-21 ENCOUNTER — Other Ambulatory Visit: Payer: Self-pay | Admitting: Family Medicine

## 2019-08-24 ENCOUNTER — Ambulatory Visit: Payer: Self-pay | Admitting: Family Medicine

## 2019-08-24 ENCOUNTER — Other Ambulatory Visit: Payer: Self-pay | Admitting: Family Medicine

## 2019-08-24 DIAGNOSIS — Z720 Tobacco use: Secondary | ICD-10-CM

## 2019-08-24 NOTE — Telephone Encounter (Signed)
Routing to PCP for approval.

## 2019-08-24 NOTE — Telephone Encounter (Signed)
Pt reports BP 168/102 at 1800, 15 minutes later 171/90. Denies any symptoms except "Sinus headache been going on for a while." Is not on any BP meds. Pt tearful during call regarding sons death. Hyperverbal. Advised pt TN would send encounter to practice for Dr. Doug Sou review. Care advise given, advised ED for severe headache, weakness, visual changes, CP, SOB. Pt verbalizes understanding. Please advise: After hours call.  [place]  Reason for Disposition . Systolic BP  >= 158 OR Diastolic >= 727  Answer Assessment - Initial Assessment Questions 1. BLOOD PRESSURE: "What is the blood pressure?" "Did you take at least two measurements 5 minutes apart?"    168/102  15 minutes later    171/90   2. ONSET: "When did you take your blood pressure?"     1800 3. HOW: "How did you obtain the blood pressure?" (e.g., visiting nurse, automatic home BP monitor)     Home monitor 4. HISTORY: "Do you have a history of high blood pressure?"     Yes 5. MEDICATIONS: "Are you taking any medications for blood pressure?" "Have you missed any doses recently?"    no 6. OTHER SYMPTOMS: "Do you have any symptoms?" (e.g., headache, chest pain, blurred vision, difficulty breathing, weakness)     Increased fatigue  Protocols used: HIGH BLOOD PRESSURE-A-AH

## 2019-08-25 NOTE — Telephone Encounter (Signed)
Spoke to the pt.  She said this morning her bp was 130s over 80s.  Stated she thinks the high BP is from the stress of losing her son.  Pt was very tearful on the phone.  She will continue to monitor her BP will call back if becomes elevated.  I have scheduled her a 30 minute appointment on 08/26/2019 @ 3:30.  She is looking forward to talking to Dr. [place].  Will forward as FYI.

## 2019-08-26 ENCOUNTER — Encounter: Payer: Self-pay | Admitting: Family Medicine

## 2019-08-26 ENCOUNTER — Ambulatory Visit (INDEPENDENT_AMBULATORY_CARE_PROVIDER_SITE_OTHER): Payer: Medicare Other | Admitting: Family Medicine

## 2019-08-26 ENCOUNTER — Other Ambulatory Visit: Payer: Self-pay

## 2019-08-26 VITALS — BP 128/94 | HR 74 | Temp 97.6°F | Resp 20 | Ht 66.0 in | Wt 146.0 lb

## 2019-08-26 DIAGNOSIS — Z634 Disappearance and death of family member: Secondary | ICD-10-CM

## 2019-08-26 DIAGNOSIS — F4321 Adjustment disorder with depressed mood: Secondary | ICD-10-CM

## 2019-08-26 DIAGNOSIS — G4700 Insomnia, unspecified: Secondary | ICD-10-CM

## 2019-08-26 DIAGNOSIS — I10 Essential (primary) hypertension: Secondary | ICD-10-CM | POA: Diagnosis not present

## 2019-08-26 DIAGNOSIS — F419 Anxiety disorder, unspecified: Secondary | ICD-10-CM

## 2019-08-26 DIAGNOSIS — Z23 Encounter for immunization: Secondary | ICD-10-CM | POA: Diagnosis not present

## 2019-08-26 DIAGNOSIS — I739 Peripheral vascular disease, unspecified: Secondary | ICD-10-CM

## 2019-08-26 MED ORDER — TRAZODONE HCL 100 MG PO TABS: 100.0000 mg | ORAL_TABLET | Freq: Every day | ORAL | 2 refills | Status: DC

## 2019-08-26 MED ORDER — AMLODIPINE BESYLATE 2.5 MG PO TABS: 2.5000 mg | ORAL_TABLET | Freq: Every day | ORAL | 0 refills | Status: DC

## 2019-08-26 NOTE — Patient Instructions (Addendum)
A few things to remember from today's visit:   Insomnia, unspecified type - Plan: traZODone (DESYREL) 100 MG tablet  Essential hypertension - Plan: amLODipine (NORVASC) 2.5 MG tablet  Anxiety disorder, unspecified type - Plan: traZODone (DESYREL) 100 MG tablet  Grief at loss of child  PAD (peripheral artery disease) (HCC)  Today amlodipine 2.5 mg added to take at bedtime. Continue monitoring your blood pressure, will need to be careful with low blood pressures. Now that just the amitriptyline, we can increase trazodone from 50 mg to 100 mg. No changes in lorazepam. Please arrange counseling through hospice.  Please follow in 4 to 6 weeks.  Please be sure medication list is accurate. If a new problem present, please set up appointment sooner than planned today.

## 2019-08-26 NOTE — Progress Notes (Signed)
HPI:  Chief Complaint  Patient presents with  . Death of Son  . Hypertension    [name] is a 73 y.o. female, who is here today concerned about elevated BP for the past few days.  She was last seen on 07/21/2019.  4 days ago she started with BP's 140's-190's/100's. She has hx of HTN,antihypertensives were discontinued because hypotension.  She attributed BP elevation to stress and grief after the death of her son.He died in 2019-06-30, started with health issues, died while he was in hospice.  Denies severe/frequent headache, visual changes, chest pain,worsening dyspnea, palpitation, focal weakness, or edema.  C/O bilateral lower extremity numbness and calves pain with prolonged walking. PAD on Pletal 100 mg twice daily and atorvastatin 80 mg daily. She recently quit smoking. States that she received good news from her pulmonologist, chest CT done recently looks better that the one done 6 years ago.  Anxiety and insomnia:  She weaned off Amitriptyline. She is on Lorazepam 2 mg at bedtime and Trazodone 50 mg. Worsening symptoms due to increased stress. Negative for suicidal thoughts.  She lives with her husband.   Review of Systems  Constitutional: Positive for appetite change and fatigue. Negative for activity change, fever and unexpected weight change.  HENT: Negative for mouth sores, nosebleeds, sore throat and trouble swallowing.   Eyes: Negative for redness.  Respiratory: Negative for cough and wheezing.   Gastrointestinal: Negative for abdominal pain, nausea and vomiting.       Negative for changes in bowel habits.  Genitourinary: Negative for decreased urine volume and hematuria.  Neurological: Negative for syncope and facial asymmetry.  Psychiatric/Behavioral: Negative for confusion and hallucinations.  Rest see pertinent positives and negatives per HPI.   Current Outpatient Medications on File Prior to Visit  Medication Sig Dispense Refill  .  albuterol (PROVENTIL HFA;VENTOLIN HFA) 108 (90 Base) MCG/ACT inhaler Inhale 2 puffs into the lungs every 4 (four) hours as needed for wheezing. As a RESCUE medication 1 Inhaler 1  . aspirin 81 MG tablet Take 81 mg by mouth every evening.     Marland Kitchen atorvastatin (LIPITOR) 80 MG tablet TAKE 1 TABLET BY MOUTH EVERY DAY (Patient taking differently: Take 80 mg by mouth at bedtime. ) 90 tablet 3  . Biotin 1000 MCG tablet Take 1,000 mcg by mouth daily.    . cilostazol (PLETAL) 100 MG tablet TAKE 1 TABLET BY MOUTH TWICE A DAY (Patient taking differently: Take 100 mg by mouth 2 (two) times daily. ) 180 tablet 2  . dexlansoprazole (DEXILANT) 60 MG capsule Take 1 capsule (60 mg total) by mouth daily. 90 capsule 1  . docusate sodium (COLACE) 100 MG capsule Take 100 mg by mouth at bedtime.    Marland Kitchen ezetimibe (ZETIA) 10 MG tablet TAKE 1 TABLET BY MOUTH EVERY DAY 90 tablet 3  . famotidine (PEPCID) 20 MG tablet TAKE 1 TABLET BY MOUTH TWICE A DAY (Patient taking differently: Take 20 mg by mouth 2 (two) times daily. ) 180 tablet 1  . fluticasone (FLONASE) 50 MCG/ACT nasal spray PLACE 1 SPRAY INTO BOTH NOSTRILS 2 (TWO) TIMES DAILY. 16 mL 1  . Fluticasone-Umeclidin-Vilant (TRELEGY ELLIPTA) 100-62.5-25 MCG/INH AEPB Inhale 1 puff into the lungs daily. 60 each 3  . ipratropium-albuterol (DUONEB) 0.5-2.5 (3) MG/3ML SOLN USE 1 NEB 3 TIMES DAILY FOR 3 DAYS, THEN EVERY 6 HOURS AS NEEDED. (Patient taking differently: Inhale 3 mLs into the lungs every 6 (six) hours as  needed (sob, wheezing). ) 360 mL 0  . levothyroxine (SYNTHROID) 112 MCG tablet TAKE 1 TABLET (112 MCG TOTAL) BY MOUTH DAILY BEFORE BREAKFAST. (Patient taking differently: Take 112 mcg by mouth at bedtime. ) 90 tablet 1  . LORazepam (ATIVAN) 2 MG tablet TAKE 1 TABLET BY MOUTH AT BEDTIME AS NEEDED FOR ANXIETY (Patient taking differently: Take 2 mg by mouth at bedtime. ) 30 tablet 3  . omeprazole (PRILOSEC) 40 MG capsule TAKE 1 CAPSULE BY MOUTH TWICE A DAY (Patient taking  differently: Take 40 mg by mouth 2 (two) times daily. ) 180 capsule 1  . roflumilast (DALIRESP) 500 MCG TABS tablet Take 1 tablet (500 mcg total) by mouth daily. 30 tablet 11  . guaiFENesin (MUCINEX) 600 MG 12 hr tablet Take 600 mg by mouth 2 (two) times daily as needed for cough or to loosen phlegm.      No current facility-administered medications on file prior to visit.      Past Medical History:  Diagnosis Date  . Bilateral leg cramps   . Bladder neoplasm   . Chronic deep vein thrombosis (DVT) of right lower extremity ([place])    05/ 2018  chronic non-occlusive DVT RLE  . COPD, frequent exacerbations ([place])    03-06-2018 per pt last exacerbation 12/ 2018  . Coronary artery disease    cardiologist-  dr Aundra Dubin-- 03-11-2018 er cath , 80% stenosis in the ostial second diagonal  . Dyspnea on minimal exertion   . Emphysema/COPD ([place])    CAT score- 17  . Family history of breast cancer   . Family history of colon cancer   . Family history of melanoma   . Family history of ovarian cancer   . Family history of pancreatic cancer   . Fibromyalgia 1995  . GERD (gastroesophageal reflux disease)   . Hiatal hernia   . History of breast cancer   . History of diverticulitis of colon    2002  s/p  sigmoid colectomy  . History of multiple pulmonary nodules    hx RUL nodules x2  s/p right VATS w/ wedge resection 09-11-2005 and 06-14-2011  both  necrotizing granulomatous inflammation w/ cystic area of necrosis & focal calcification  . History of right breast cancer oncologist-  dr Jana Hakim-- no recurrence   dx 2010--  IDC, Stage IA , ER/PR+,  (pT1c pN0) 11-09-2008 right lumpectomy;  12-18-2008 right simple mastectomy for DCIS margins;  completed chemotherapy 2010 (no radiation) and completed antiestrogen therapy  . Hx of colonic polyps    Dr. Cristina Gong -last study '11  . Hyperlipidemia   . Hypertension   . Hypothyroidism   . 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  Nocturia   . OA (osteoarthritis)    thumbs  . On supplemental oxygen therapy    03-06-2018 per pt uses only at night,  checks O2 sats at home,  stated am sat 89% after moving around average 93-94% with RA  . Osteoporosis   . Peripheral arterial occlusive disease [facility]) vascular-- dr chen/ dr Donzetta Matters   proximal right SFA severe focal stenosis with collaterals from the PFA/  04/ 2012 occluded celiac and SMA arteries with distal reconstitution w/ patent IMA  . Peripheral vascular disease ([place])    chronic DVT RLE,  mesenteric vascular disease  . Right lower lobe pulmonary nodule    Chest CT 08-29-2017  . Stage 3 severe COPD by GOLD classification (HCC) hx frequent exacerbations--  pulmologist-  dr Maryann Alar--  per lov note , dated 12-20-2017, oxyogen 2L is prescribed for use with exertion (but pt only uses mostly at night), O2 sats on RA run the 80s , this day sat 86% RA and with 2L O2 sat 92%  . Varicose veins of leg with swelling    varicose vein surgery - Dr. Aleda Grana  . Wears glasses   . Wears hearing aid in both ears    Allergies  Allergen Reactions  . Sulfa Antibiotics Swelling    Social History   Socioeconomic History  . Marital status: Married    Spouse name: Not on file  . Number of children: 3  . Years of education: 27  . Highest education level: Not on file  Occupational History  . 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 Financial resource strain: Not on file  . Food insecurity    Worry: Not on file    Inability: Not on file  . Transportation needs    Medical: Not on file    Non-medical: Not on file  Tobacco Use  . Smoking status: Former Smoker    Packs/day: 0.75    Years: 50.00    Pack years: 37.50    Types: Cigarettes    Quit date: 05/18/2016    Years since quitting: 3.2  . Smokeless tobacco: Never Used  Substance and Sexual Activity  . Alcohol use: Yes    Alcohol/week: 10.0 standard drinks    Types: 10 Cans of  beer per week  . Drug use: No  . Sexual activity: Not on file  Lifestyle  . Physical activity    Days per week: Not on file    Minutes per session: Not on file  . Stress: Not on file  Relationships  . Social Herbalist on phone: Not on file    Gets together: Not on file    Attends religious service: Not on file    Active member of club or organization: Not on file    Attends meetings of clubs or organizations: Not on file    Relationship status: Not on file  Other Topics Concern  . Not on file  Social History Narrative   HSG, beauty school. Married '69 -67 yrs/widowed; married '79 - 98yr/divorced; married '87.  2 sons - '70, '74, 1 srtep-son; 1 grandchild. 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 SO - good health.  NO history of abuse.  ACP - full code and all heroic measures.     Vitals:   08/26/19 1534  BP: (!) 128/94  Pulse: 74  Resp: 20  Temp: 97.6 F (36.4 C)   Body mass index is 23.57 kg/m.   Physical Exam  Nursing note and vitals reviewed. Constitutional: She is oriented to person, place, and time. She appears well-developed. No distress.  HENT:  Head: Normocephalic and atraumatic.  Mouth/Throat: Oropharynx is clear and moist and mucous membranes are normal.  Eyes: Pupils are equal, round, and reactive to light. Conjunctivae are normal.  Cardiovascular: Normal rate and regular rhythm.  No murmur heard. DP pulses present bilateral. Normal capillary refills. No calves tenderness upon palpation, no erythema.  Respiratory: Effort normal and breath sounds normal. No respiratory distress.  GI: Soft. She exhibits no mass. There is no hepatomegaly. There is no abdominal tenderness.  Musculoskeletal:        General: No edema.  Lymphadenopathy:    She has no cervical  adenopathy.  Neurological: She is alert and oriented to person, place, and time. She has normal strength. No cranial nerve deficit.  Stable gait, not assisted.  Skin: Skin is warm. No rash noted. No  erythema.  Psychiatric: Her mood appears anxious. Her affect is labile. She exhibits a depressed mood. She expresses no suicidal ideation. She expresses no suicidal plans.  Well groomed, good eye contact.    ASSESSMENT AND PLAN:  [name] was seen today for death of son and hypertension.  Diagnoses and all orders for this visit:  Insomnia, unspecified type Not well controlled. Given the fact that she is not longer on Amitriptyline,Trazodone dose can be increased from 50 mg to 100 mg daily. Continue Lorazepam at bedtime. Good sleep hygiene.  -     traZODone (DESYREL) 100 MG tablet; Take 1 tablet (100 mg total) by mouth at bedtime.  Essential hypertension Based on home BP problem is not well controlled. Today DBP mildly elevated. Low salt diet recommend. Explained that we need to be cautious,she needs to monitor BP closely. Instructed about warning signs. F/U in 6 weeks,before if needed.  -     amLODipine (NORVASC) 2.5 MG tablet; Take 1 tablet (2.5 mg total) by mouth daily.  Anxiety disorder, unspecified type Worse after the death of her son. No changes in Lorazepam,continue 2 mg at bedtime. Trazodone increased from 50 mg to 100 mg. Instructed about warning signs.  -     traZODone (DESYREL) 100 MG tablet; Take 1 tablet (100 mg total) by mouth at bedtime.  Grief at loss of child Strongly recommend arranging appt with hospice counseling service.  PAD (peripheral artery disease) (HCC) + Claudication. Peripheral pulses present. Continue Pletal 100 mg bid and Atorvastatin. Congratulated about smoking cessation. Instructed about warning signs. Continue following with cardiologist.  Need for prophylactic vaccination and inoculation against influenza -     Flu Vaccine QUAD High Dose(Fluad)   Return in about 6 weeks (around 10/07/2019) for Insomnia,anxiety, and HTN.  -[name] was advised to seek immediate medical attention if sudden worsening symptoms.   [name]  G. [place], MD  [facility]. [place] office.

## 2019-08-30 ENCOUNTER — Other Ambulatory Visit: Payer: Self-pay | Admitting: Nurse Practitioner

## 2019-08-30 ENCOUNTER — Other Ambulatory Visit: Payer: Self-pay | Admitting: Family Medicine

## 2019-08-30 DIAGNOSIS — I739 Peripheral vascular disease, unspecified: Secondary | ICD-10-CM

## 2019-08-30 DIAGNOSIS — I10 Essential (primary) hypertension: Secondary | ICD-10-CM

## 2019-09-21 ENCOUNTER — Other Ambulatory Visit: Payer: Self-pay

## 2019-09-21 DIAGNOSIS — G4700 Insomnia, unspecified: Secondary | ICD-10-CM

## 2019-09-21 DIAGNOSIS — F419 Anxiety disorder, unspecified: Secondary | ICD-10-CM

## 2019-09-21 MED ORDER — TRAZODONE HCL 100 MG PO TABS: 100.0000 mg | ORAL_TABLET | Freq: Every day | ORAL | 1 refills | Status: DC

## 2019-09-24 ENCOUNTER — Other Ambulatory Visit: Payer: Self-pay | Admitting: Nurse Practitioner

## 2019-09-24 DIAGNOSIS — I739 Peripheral vascular disease, unspecified: Secondary | ICD-10-CM

## 2019-09-28 ENCOUNTER — Other Ambulatory Visit: Payer: Self-pay | Admitting: Nurse Practitioner

## 2019-09-28 DIAGNOSIS — I739 Peripheral vascular disease, unspecified: Secondary | ICD-10-CM

## 2019-10-01 ENCOUNTER — Other Ambulatory Visit: Payer: Self-pay | Admitting: Family Medicine

## 2019-10-01 DIAGNOSIS — F419 Anxiety disorder, unspecified: Secondary | ICD-10-CM

## 2019-10-01 DIAGNOSIS — G4700 Insomnia, unspecified: Secondary | ICD-10-CM

## 2019-10-01 NOTE — Telephone Encounter (Signed)
Requested medication (s) are due for refill today: yes  Requested medication (s) are on the active medication list: yes  Last refill:  05/24/2019  Future visit scheduled: no  Notes to clinic: Patient is out of medication Refill cannot be delegated    Requested Prescriptions  Pending Prescriptions Disp Refills   LORazepam (ATIVAN) 2 MG tablet 30 tablet 3    Sig: TAKE 1 TABLET BY MOUTH AT BEDTIME AS NEEDED FOR ANXIETY      Not Delegated - Psychiatry:  Anxiolytics/Hypnotics Failed - 10/01/2019  1:36 PM      Failed - This refill cannot be delegated      Failed - Urine Drug Screen completed in last 360 days.      Passed - Valid encounter within last 6 months    Recent Outpatient Visits           1 month ago Insomnia, unspecified type   [facility]rapist, [facility] at [facility], Malka So, MD   2 months ago COPD, frequent exacerbations ([place])   [place], Malka So, MD   5 months ago [place] [facility], Malka So, MD   8 months ago Gastroesophageal reflux disease, esophagitis presence not specified   Therapist, [facility] at [facility], Malka So, MD   1 year ago Insomnia, unspecified type   [facility]rapist[facility] at [facility], Malka So, MD

## 2019-10-01 NOTE — Telephone Encounter (Signed)
Medication Refill - Medication: Lorazepam   Has the patient contacted their pharmacy? Yes.   Pt states she reached out to pharmacy 4 days ago and they have received no response. Pt is completely out of medication. Please advise.  (Agent: If no, request that the patient contact the pharmacy for the refill.) (Agent: If yes, when and what did the pharmacy advise?)  Preferred Pharmacy (with phone number or street name):  [facility], [place] - [address]  [place] [address]  Phone: [phone] Fax: [phone]  Not a 24 hour pharmacy; exact hours not known.     Agent: Please be advised that RX refills may take up to 3 business days. We ask that you follow-up with your pharmacy.

## 2019-10-01 NOTE — Telephone Encounter (Signed)
PCP and nurse out of the office on today, will return on tomorrow.Request awaiting approval.

## 2019-10-02 MED ORDER — LORAZEPAM 2 MG PO TABS: ORAL_TABLET | ORAL | 3 refills | Status: DC

## 2019-10-02 NOTE — Telephone Encounter (Signed)
Last OV 08/2019

## 2019-10-05 ENCOUNTER — Other Ambulatory Visit: Payer: Self-pay | Admitting: Nurse Practitioner

## 2019-10-05 DIAGNOSIS — I739 Peripheral vascular disease, unspecified: Secondary | ICD-10-CM

## 2019-10-23 NOTE — Progress Notes (Signed)
CARDIOLOGY OFFICE NOTE  Date:  10/28/2019    [name] Date of Birth: 1945/12/08 Medical Record #[id number]  PCP:  [place], [name], MD  Cardiologist:  Servando Snare     Chief Complaint  Patient presents with  . Follow-up    History of Present Illness: [name] is a 74 y.o. female who presents today for a follow up visit. 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 Shehas elected to seeme going forward.   She has a history of smoking, HTN, Gold stage III COPD, right breast cancer, hyperlipidemia, PAD, and mesenteric vascular disease. Patient had been having nocturnal and post-prandial abdominal cramping. She had a mesenteric angiogram done in 5/12 by Dr. Bridgett Larsson, showing occluded celiac and SMA arteries with patent IMA. She was planned for aorto-mesenteric bypass. She was referred for cardiology evaluation prior to surgery. However, it was ultimately decided not to treat her mesenteric disease surgically.   Given her exertional chest tightness and shortness of breath as well as known vascular disease, Dr. Aundra Dubin set her up for left heart cath. This was done in 5/12 and showed a 70-80% ostial stenosis of a small to moderate 2nd diagonal. This was too small to intervene on and not likely to be particularly symptomatic. Lower extremity arterial dopplers showed a severe focal proximal right SFA stenosis with collaterals from the PFA. She had a VATS for a right-sided lung nodule that showed granulomatous inflammation (no cancer). She quit smoking. She saw Dr. Fletcher Anon for PAD evaluation, and it was decided to treat her medically. She tried cilostazol but did not see much difference with its use so stopped it.   I saw her backNovember of 2017 - she was doing well. Was back on Pletal. Not smoking.Was referred here back last June due to leg swelling. Duplex negative but she had been referred to [facility] to also discuss possible aortogram.  She had had a significant rash - saw Rheumatology - not  felt to have lupus. Her cardiac status fortunately, felt to be ok. I did want to get her echo updated at last visit - but looks like this was never completed however. I last saw her in June of 2019 - she had had surgery for a bladder tumor - may have been related to her breast cancer - otherwise, seemed to be ok from our standpoint. She was not smoking. Using oxygen at night.    The patient does not have symptoms concerning for COVID-19 infection (fever, chills, cough, or new shortness of breath).   Comes in today. Here alone. Medicines are a little unclear. She really does not know what she is taking. She has not been able to afford some of them. Says she has not been doing well. Her legs are aching more - with and without walking. No more ulcers on her legs. Feels "like they are going to fall off".  She is asking about going back to see Dr. Donzetta Matters.  No chest pain. She has had progressive COPD. Hospitalized in October with an exacerbation. She feels dizzy at times - BP seems soft and she notes it is soft at home and even lower - she says even down to 68 systolic. She is not on any BP medicines that she is aware of. She has had several months of back pain/diarrhea. She does not seem to remember prior angiograms for her mesenteric ischemia. She at times notes she feels weak. She has a burning sensation in her belly.  Does not sound like  she is taking Zetia - maybe due to cost. She has gone back to smoking - her son died. She is asking for Chantix.   PMH: 1. Hypothyroidism 2. HTN 3. GERD 4. Hypothyroidism 5. OSA 6. Venous insufficiency 7. 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 The IMA was patent. CTA abdomen (6/14) showed occluded celiac and SMA with distal reconstitution, no changes to suggest bowel infarction or ischemia.  8. Hyperlipidemia 9. COPD: PFTs (6/12) with moderate obstructive airways disease and response  to bronchodilator.  10. Hyperlipidemia 11. Fibromyalgia 12. Breast cancer s/p mastectomy and chemotherapy in 2010.  13. CAD: LHC (5/12) with 70-80% ostial stenosis of a small-moderate 2nd diagonal. This was too small to intervene upon and unlikely to cause significant symptoms.  14. PAD: Peripheral arterial dopplers (5/12) with severe focal proximal right SFA stenosis. There were collaterals from the PFA. Patient has claudication symptoms. Peripheral arterial dopplers in 5/13 were stable compared to 5/12. Peripheral arterial dopplers (8/14) with ABIs 0.71 on right, 1.1 on left (known right SFA occlusion).  15. 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 VATS 8/12 for right upper lobe nodule showed granulomatous material.    Past Medical History:  Diagnosis Date  . Bilateral leg cramps   . Bladder neoplasm   . Chronic deep vein thrombosis (DVT) of right lower extremity ([place])    05/ 2018  chronic non-occlusive DVT RLE  . COPD, frequent exacerbations ([place])    03-06-2018 per pt last exacerbation 12/ 2018  . Coronary artery disease    cardiologist-  dr Aundra Dubin-- 03-11-2018 er cath , 80% stenosis in the ostial second diagonal  . Dyspnea on minimal exertion   . Emphysema/COPD ([place])    CAT score- 17  . Family history of breast cancer   . Family history of colon cancer   . Family history of melanoma   . Family history of ovarian cancer   . Family history of pancreatic cancer   . Fibromyalgia 1995  . GERD (gastroesophageal reflux disease)   . Hiatal hernia   . History of breast cancer   . History of diverticulitis of colon    2002  s/p  sigmoid colectomy  . History of multiple pulmonary nodules    hx RUL nodules x2  s/p right VATS w/ wedge resection 09-11-2005 and 06-14-2011  both  necrotizing granulomatous inflammation w/ cystic area of necrosis & focal calcification  . History of right breast cancer oncologist-  dr Jana Hakim-- no recurrence    dx 2010--  IDC, Stage IA , ER/PR+,  (pT1c pN0) 11-09-2008 right lumpectomy;  12-18-2008 right simple mastectomy for DCIS margins;  completed chemotherapy 2010 (no radiation) and completed antiestrogen therapy  . Hx of colonic polyps    Dr. Cristina Gong -last study '11  . Hyperlipidemia   . Hypertension   . Hypothyroidism   . 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 Nocturia   . OA (osteoarthritis)    thumbs  . On supplemental oxygen therapy    03-06-2018 per pt uses only at night,  checks O2 sats at home,  stated am sat 89% after moving around average 93-94% with RA  . Osteoporosis   . Peripheral arterial occlusive disease [facility]) vascular-- dr chen/ dr Donzetta Matters   proximal right SFA severe focal stenosis with collaterals from the PFA/  04/ 2012 occluded celiac and SMA arteries with distal reconstitution  w/ patent IMA  . Peripheral vascular disease ([place])    chronic DVT RLE,  mesenteric vascular disease  . Right lower lobe pulmonary nodule    Chest CT 08-29-2017  . Stage 3 severe COPD by GOLD classification ([place]) hx frequent exacerbations--   pulmologist-  dr Maryann Alar--  per lov note , dated 12-20-2017, oxyogen 2L is prescribed for use with exertion (but pt only uses mostly at night), O2 sats on RA run the 80s , this day sat 86% RA and with 2L O2 sat 92%  . Varicose veins of leg with swelling    varicose vein surgery - Dr. Aleda Grana  . Wears glasses   . Wears hearing aid in both ears     Past Surgical History:  Procedure Laterality Date  . BREAST EXCISIONAL BIOPSY Left 10/15/2008  . BREAST LUMPECTOMY W/ NEEDLE LOCALIZATION Right 11-09-2008    dr Brantley Stage   [facility]  . CARDIAC CATHETERIZATION  03-12-2011   dr Aundra Dubin   70-80% ostial stenosis in small second diagonal (appears to small for intervention),  dLAD 40-50%,  minimal luminal irregulartieis involoving LCFx and RCA,  LVEF 55%  . CATARACT EXTRACTION W/ INTRAOCULAR LENS  IMPLANT, BILATERAL  2011  . CYSTOSCOPY  N/A 03/11/2018   Procedure: CYSTOSCOPY WITH INSTILLATION OF POST OPERATIVE EPIRUBICIN;  Surgeon: Festus Aloe, MD;  Location: [facility];  Service: Urology;  Laterality: N/A;  . MASTECTOMY Right   . PARTIAL COLECTOMY  2002   sigmoid and appectomy (diverticulitis)  . PARTIAL MASTECTOMY WITH NEEDLE LOCALIZATION Left 02/23/2013   Procedure:  LEFT PARTIAL MASTECTOMY WITH NEEDLE LOCALIZATION;  Surgeon: Adin Hector, MD;  Location: [facility];  Service: General;  Laterality: Left;  . RECONSTRUCTION BREAST W/ LATISSIMUS DORSI FLAP Right 05-30-2009   dr Harlow Mares  [facility]  . REDUCTION MAMMAPLASTY Left   . SIMPLE MASTECTOMY Right 12-28-2008    dr Brantley Stage  [facility]  . TRANSTHORACIC ECHOCARDIOGRAM  07-09-2016  dr Aundra Dubin   ef 55-60%, grade 1 diastoic dysfunction/  mild TR  . TRANSURETHRAL RESECTION OF BLADDER TUMOR N/A 03/11/2018   Procedure: TRANSURETHRAL RESECTION OF BLADDER TUMOR (TURBT) 2-5cm;  Surgeon: Festus Aloe, MD;  Location: [facility];  Service: Urology;  Laterality: N/A;  . VAGINAL HYSTERECTOMY  1973  . VIDEO ASSISTED THORACOSCOPY (VATS)/WEDGE RESECTION Right 09-11-2005  &  06-14-2011   dr Fara Boros  [facility]   both RUL     Medications: Current Meds  Medication Sig  . amLODipine (NORVASC) 2.5 MG tablet TAKE 1 TABLET BY MOUTH EVERY DAY  . aspirin 81 MG tablet Take 81 mg by mouth every evening.   Marland Kitchen atorvastatin (LIPITOR) 80 MG tablet TAKE 1 TABLET BY MOUTH EVERY DAY (Patient taking differently: Take 80 mg by mouth at bedtime. )  . Biotin 1000 MCG tablet Take 1,000 mcg by mouth daily.  . cilostazol (PLETAL) 100 MG tablet Take 1 tablet (100 mg total) by mouth 2 (two) times daily. Please make overdue appt with the Doctor before anymore refills. 2nd attempt  . docusate sodium (COLACE) 100 MG capsule Take 100 mg by mouth at bedtime.  Marland Kitchen ezetimibe (ZETIA) 10 MG tablet TAKE 1 TABLET BY MOUTH EVERY DAY  . fluticasone (FLONASE) 50 MCG/ACT nasal spray PLACE 1 SPRAY INTO BOTH NOSTRILS 2 (TWO)  TIMES DAILY.  Marland Kitchen Fluticasone-Umeclidin-Vilant (TRELEGY ELLIPTA) 100-62.5-25 MCG/INH AEPB Inhale 1 puff into the lungs daily.  Marland Kitchen guaiFENesin (MUCINEX) 600 MG 12 hr tablet Take 600 mg by mouth 2 (two) times daily as needed for  cough or to loosen phlegm.   Marland Kitchen ipratropium-albuterol (DUONEB) 0.5-2.5 (3) MG/3ML SOLN USE 1 NEB 3 TIMES DAILY FOR 3 DAYS, THEN EVERY 6 HOURS AS NEEDED. (Patient taking differently: Inhale 3 mLs into the lungs every 6 (six) hours as needed (sob, wheezing). )  . levothyroxine (SYNTHROID) 112 MCG tablet TAKE 1 TABLET (112 MCG TOTAL) BY MOUTH DAILY BEFORE BREAKFAST. (Patient taking differently: Take 112 mcg by mouth at bedtime. )  . LORazepam (ATIVAN) 2 MG tablet TAKE 1 TABLET BY MOUTH AT BEDTIME AS NEEDED FOR ANXIETY  . Methen-Hyosc-Meth Blue-Na Phos (ME/NAPHOS/MB/HYO1) 81.6 MG TABS Take 1 tablet by mouth 2 (two) times daily as needed.  Marland Kitchen omeprazole (PRILOSEC) 40 MG capsule TAKE 1 CAPSULE BY MOUTH TWICE A DAY (Patient taking differently: Take 40 mg by mouth 2 (two) times daily. )  . traZODone (DESYREL) 100 MG tablet Take 1 tablet (100 mg total) by mouth at bedtime.     Allergies: Allergies  Allergen Reactions  . Sulfa Antibiotics Swelling    Social History: The patient  reports that she quit smoking about 3 years ago. Her smoking use included cigarettes. She has a 37.50 pack-year smoking history. She has never used smokeless tobacco. She reports current alcohol use of about 10.0 standard drinks of alcohol per week. She reports that she does not use drugs.   Family History: The patient's family history includes Breast cancer (age of onset: 55) in her niece; COPD in her mother; Cancer in her sister; Colon cancer in her mother; Coronary artery disease in her brother and father; Diabetes in her mother; Emphysema in her mother; Heart attack in her brother and father; Heart disease in her father; Hypertension in her mother, sister, and sister; Melanoma in her niece and sister;  Ovarian cancer in her sister; Pancreatic cancer in her sister; Sudden death in her father; Throat cancer in her sister.   Review of Systems: Please see the history of present illness.   All other systems are reviewed and negative.   Physical Exam: VS:  BP 90/68   Pulse 80   Ht 5\' 6"  (1.676 m)   Wt 140 lb (63.5 kg)   BMI 22.60 kg/m  .  BMI Body mass index is 22.6 kg/m.  Wt Readings from Last 3 Encounters:  10/28/19 140 lb (63.5 kg)  08/26/19 146 lb (66.2 kg)  07/21/19 146 lb 2 oz (66.3 kg)    General: Just pitiful. She is very tearful. She is in no acute distress.   HEENT: Normal.  Neck: Supple, no JVD, carotid bruits, or masses noted.  Cardiac: Heart tones are distant. No edema.  Respiratory:  Very diminished breath sounds with normal work of breathing.  GI: Soft.  MS: No deformity or atrophy. Gait and ROM intact.  Skin: Warm and dry. Color is a little ruddy.  Neuro:  Strength and sensation are intact and no gross focal deficits noted.  Psych: Alert, appropriate and with normal affect.   LABORATORY DATA:  EKG:  EKG is not ordered today.  Lab Results  Component Value Date   WBC 4.5 07/13/2019   HGB 15.7 (H) 07/13/2019   HCT 47.4 (H) 07/13/2019   PLT 325 07/13/2019   GLUCOSE 73 07/13/2019   CHOL 147 06/04/2018   TRIG 73 06/04/2018   HDL 83 06/04/2018   LDLDIRECT 88.0 08/18/2013   LDLCALC 49 06/04/2018   ALT 19 12/01/2018   AST 17 12/01/2018   NA 137 07/13/2019   K 4.1 07/13/2019  CL 103 07/13/2019   CREATININE 0.62 07/13/2019   BUN 12 07/13/2019   CO2 25 07/13/2019   TSH 0.86 07/09/2019   INR 0.98 03/23/2013   HGBA1C 5.7 (H) 01/03/2018     BNP (last 3 results) No results for input(s): BNP in the last 8760 hours.  ProBNP (last 3 results) No results for input(s): PROBNP in the last 8760 hours.   Other Studies Reviewed Today:   ECHO IMPRESSIONS 07/2019   1. Left ventricular ejection fraction, by visual estimation, is 60 to 65%. The left  ventricle has normal function. Normal left ventricular size. There is borderline left ventricular hypertrophy.  2. Nondiagnostic diastolic function due to mitral annular calcification.  3. Global right ventricle has normal systolic function.The right ventricular size is normal. No increase in right ventricular wall thickness.  4. Left atrial size was normal.  5. Right atrial size was normal.  6. Presence of pericardial fat pad.  7. The pericardial effusion is circumferential.  8. Trivial pericardial effusion is present.  9. Mild mitral annular calcification. 10. The mitral valve is degenerative. Trace mitral valve regurgitation. 11. The tricuspid valve is grossly normal. Tricuspid valve regurgitation is trivial. 12. The aortic valve is tricuspid Aortic valve regurgitation was not visualized by color flow Doppler. Structurally normal aortic valve, with no evidence of sclerosis or stenosis. 13. The pulmonic valve was grossly normal. Pulmonic valve regurgitation is trivial by color flow Doppler. 14. TR signal is inadequate for assessing pulmonary artery systolic pressure. 15. The inferior vena cava is normal in size with greater than 50% respiratory variability, suggesting right atrial pressure of 3 mmHg. 16. The interatrial septum appears to be lipomatous. 17. No significant change compared with 07/09/2016.   Myoview Study Highlights 03/2018    Nuclear stress EF: 62%.  There was no ST segment deviation noted during stress.  This is a low risk study.  The left ventricular ejection fraction is normal (55-65%).   1. EF 62%, normal wall motion.  2. Generally count-poor study.  Fixed small apical septal perfusion defect. Given normal wall motion, suspect most likely due to attenuation.   Low risk study.       CT ANGIO BIFEMIMPRESSION6/2018: VASCULAR  Moderate atherosclerotic disease throughout the aorta and common iliac vessels.  Near complete occlusion of the celiac artery  and complete occlusion of the proximal SMA. Reconstitution of the SMA through IMA.  Moderate disease throughout the right superficial femoral artery and trifurcation vessels in the right calf. Mild disease within the left posterior tibial artery and proximal anterior tibial artery, otherwise patent left lower extremity runoff.  No visible deep venous thrombosis.  NON-VASCULAR  Moderate-sized hiatal hernia.  No acute findings in the abdomen or pelvis.  Subcutaneous edema/swelling in the right lower extremity from the mid thigh through the calf.   Electronically Signed By: Rolm Baptise M.D. On: 03/16/2017 18:22  Summary:  - No evidence of deep vein thrombosis involving the right lower extremity. - No evidence of Baker&'s cyst on the right.  Other specific details can be found in the table(s) above. Prepared and Electronically Authenticated by  Jessy Oto. Fields MD 2018-06-03T12:09:54   02/27/2017: Vascular US Lower Extremities Evidence of right lower extremity chronic, non occlusive, deep venous thrombus in one of the paired peroneal veins with incompetency.  No evidence of left lower extremity deep or superficial venous thrombus or incompetence.    Assessment/Plan:  1.Abdominal pain/claudication/vasculopath - with known history of mesenteric ischemia - will try to get her  back to see Dr. Donzetta Matters - not sure if he can help but would like his recommendations. My overall worry that she has progressive vascular disease throughtout.   2. Tobacco abuse - she is back smoking - probably due to stress -   3. HTN - BP low - we then checked in both arms - she has had prior mastectomy of the right but I am worried about progressive vascular disease - BP 150/90 in the right and 110/80 in the left arm. I worry that she has subclavian disease as well.   4. History of non obstructive CAD - prior Myoview low risk from 2019. No active chest pain.   5. HLD - she  thinks she is on statin - probably not on the Zetia due to cost.   6. COPD - followed by pulmonary.   7. Anxiety  8. COVID-19 Education: The signs and symptoms of COVID-19 were discussed with the patient and how to seek care for testing (follow up with PCP or arrange E-visit).  The importance of social distancing, staying at home, hand hygiene and wearing a mask when out in public were discussed today.  Current medicines are reviewed with the patient today.  The patient does not have concerns regarding medicines other than what has been noted above.  The following changes have been made:  See above.  Labs/ tests ordered today include:   No orders of the defined types were placed in this encounter.    Disposition:   Trying to get her back in with Dr. Donzetta Matters - will see what his recommendations are and will then decide about our disposition.    Patient is agreeable to this plan and will call if any problems develop in the interim.   SignedTruitt Merle, NP  10/28/2019 3:59 PM  [facility] [address] [place], [place]  [address] Phone: [phone] Fax: [phone]

## 2019-10-26 ENCOUNTER — Telehealth: Payer: Self-pay | Admitting: Family Medicine

## 2019-10-26 NOTE — Telephone Encounter (Signed)
Pt is seeing cardiology 1/13.

## 2019-10-26 NOTE — Telephone Encounter (Signed)
Routing to PCP CMA

## 2019-10-26 NOTE — Telephone Encounter (Signed)
[name] called and stated she spoke with the Pt this morning and the Pts BP running low this moring 83/48 and then 96/66/ Pt is weak and has pain when urinating and pain in back, stomach  And lower legs. Pt has not been taking BP meds / please advise

## 2019-10-26 NOTE — Telephone Encounter (Signed)
I left a message for [name] with the information below & advised that pt does have an appointment with her cardiologist on 1/13.

## 2019-10-26 NOTE — Telephone Encounter (Signed)
This has been an ongoing problem. She is not longer on antihypertensive medication. If symptomatic she may want to discussed with her cardiologist benefits of sympathomimetic agents like midodrine or similar agents. Fall prevention and adequate hydration recommended for now. ER evaluation if associated CP,worsening SOB,or MS changes. Keep appointment with cardiologist. Thanks, BJ

## 2019-10-28 ENCOUNTER — Encounter (INDEPENDENT_AMBULATORY_CARE_PROVIDER_SITE_OTHER): Payer: Self-pay

## 2019-10-28 ENCOUNTER — Other Ambulatory Visit: Payer: Self-pay

## 2019-10-28 ENCOUNTER — Ambulatory Visit: Payer: Medicare Other | Admitting: Nurse Practitioner

## 2019-10-28 ENCOUNTER — Encounter: Payer: Self-pay | Admitting: Nurse Practitioner

## 2019-10-28 VITALS — BP 90/68 | HR 80 | Ht 66.0 in | Wt 140.0 lb

## 2019-10-28 DIAGNOSIS — I999 Unspecified disorder of circulatory system: Secondary | ICD-10-CM | POA: Diagnosis not present

## 2019-10-28 DIAGNOSIS — K559 Vascular disorder of intestine, unspecified: Secondary | ICD-10-CM

## 2019-10-28 DIAGNOSIS — I259 Chronic ischemic heart disease, unspecified: Secondary | ICD-10-CM | POA: Diagnosis not present

## 2019-10-28 DIAGNOSIS — I739 Peripheral vascular disease, unspecified: Secondary | ICD-10-CM | POA: Diagnosis not present

## 2019-10-28 DIAGNOSIS — Z720 Tobacco use: Secondary | ICD-10-CM

## 2019-10-28 MED ORDER — CHANTIX STARTING MONTH PAK 0.5 MG X 11 & 1 MG X 42 PO TABS: ORAL_TABLET | ORAL | 0 refills | Status: DC

## 2019-10-28 NOTE — Patient Instructions (Addendum)
After Visit Summary:  We will be checking the following labs today - NONE   Medication Instructions:    Continue with your current medicines.   I have sent another RX for Chantix to help with smoking cessation.    If you need a refill on your cardiac medications before your next appointment, please call your pharmacy.     Testing/Procedures To Be Arranged:  N/A  Follow-Up:   See me in about 6 months.   We need to get you a visit with Dr. Donzetta Matters     At [facility], you and your health needs are our priority.  As part of our continuing mission to provide you with exceptional heart care, we have created designated Provider Care Teams.  These Care Teams include your primary Cardiologist (physician) and Advanced Practice Providers (APPs -  Physician Assistants and Nurse Practitioners) who all work together to provide you with the care you need, when you need it.  Special Instructions:  . Stay safe, stay home, wash your hands for at least 20 seconds and wear a mask when out in public.  . It was good to talk with you today.    Call the [place] office at [phone] if you have any questions, problems or concerns.

## 2019-11-05 ENCOUNTER — Telehealth (HOSPITAL_COMMUNITY): Payer: Self-pay

## 2019-11-05 ENCOUNTER — Other Ambulatory Visit: Payer: Self-pay

## 2019-11-05 DIAGNOSIS — K551 Chronic vascular disorders of intestine: Secondary | ICD-10-CM

## 2019-11-05 DIAGNOSIS — I739 Peripheral vascular disease, unspecified: Secondary | ICD-10-CM

## 2019-11-05 NOTE — Telephone Encounter (Signed)

## 2019-11-06 ENCOUNTER — Encounter: Payer: Self-pay | Admitting: Vascular Surgery

## 2019-11-06 ENCOUNTER — Ambulatory Visit (HOSPITAL_COMMUNITY)
Admission: RE | Admit: 2019-11-06 | Discharge: 2019-11-06 | Disposition: A | Discharge status: Home / Self Care | Payer: Medicare Other | Source: Ambulatory Visit | Attending: Surgery | Admitting: Surgery

## 2019-11-06 ENCOUNTER — Other Ambulatory Visit: Payer: Self-pay

## 2019-11-06 ENCOUNTER — Ambulatory Visit (INDEPENDENT_AMBULATORY_CARE_PROVIDER_SITE_OTHER)
Admission: RE | Admit: 2019-11-06 | Discharge: 2019-11-06 | Disposition: A | Discharge status: Home / Self Care | Payer: Medicare Other | Source: Ambulatory Visit | Attending: Surgery | Admitting: Surgery

## 2019-11-06 ENCOUNTER — Ambulatory Visit: Payer: Medicare Other | Admitting: Vascular Surgery

## 2019-11-06 VITALS — BP 130/85 | HR 75 | Temp 97.4°F | Ht 66.0 in | Wt 138.7 lb

## 2019-11-06 DIAGNOSIS — I739 Peripheral vascular disease, unspecified: Secondary | ICD-10-CM

## 2019-11-06 DIAGNOSIS — K551 Chronic vascular disorders of intestine: Secondary | ICD-10-CM

## 2019-11-06 NOTE — Progress Notes (Signed)
Patient ID: IOMA CHISMAR, female   DOB: 02/10/1946, 74 y.o.   MRN: [id number]  Reason for Consult: No chief complaint on file.   Referred by [place], [name], MD  Subjective:     HPI:  [name] is a 74 y.o. female previous history of hypertension, COPD.  Patient was recently seen by Truitt Merle.  Previously was set up for mesenteric bypass surgery this was not completed.  I previously saw her in 2018 for a skin condition offered angiogram which we did not go forward with.  She has now healed the wounds on her legs.  She does have some pain in her legs no tissue loss or ulceration at this time.  She is now having no appetite, postprandial pain and diarrhea.  States that she has not had any emesis.  No blood in her stool.  States that she has lost weight. At my last visit her weight was 149 pound and today weight is 138.7 She takes aspirin and statin daily.  She does have shortness of breath is a limitation to her walking.  Past Medical History:  Diagnosis Date  . Bilateral leg cramps   . Bladder neoplasm   . Chronic deep vein thrombosis (DVT) of right lower extremity ([place])    05/ 2018  chronic non-occlusive DVT RLE  . COPD, frequent exacerbations ([place])    03-06-2018 per pt last exacerbation 12/ 2018  . Coronary artery disease    cardiologist-  dr Aundra Dubin-- 03-11-2018 er cath , 80% stenosis in the ostial second diagonal  . Dyspnea on minimal exertion   . Emphysema/COPD ([place])    CAT score- 17  . Family history of breast cancer   . Family history of colon cancer   . Family history of melanoma   . Family history of ovarian cancer   . Family history of pancreatic cancer   . Fibromyalgia 1995  . GERD (gastroesophageal reflux disease)   . Hiatal hernia   . History of breast cancer   . History of diverticulitis of colon    2002  s/p  sigmoid colectomy  . History of multiple pulmonary nodules    hx RUL nodules x2  s/p right VATS w/ wedge resection 09-11-2005 and 06-14-2011  both   necrotizing granulomatous inflammation w/ cystic area of necrosis & focal calcification  . History of right breast cancer oncologist-  dr Jana Hakim-- no recurrence   dx 2010--  IDC, Stage IA , ER/PR+,  (pT1c pN0) 11-09-2008 right lumpectomy;  12-18-2008 right simple mastectomy for DCIS margins;  completed chemotherapy 2010 (no radiation) and completed antiestrogen therapy  . Hx of colonic polyps    Dr. Cristina Gong -last study '11  . Hyperlipidemia   . Hypertension   . Hypothyroidism   . 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 Nocturia   . OA (osteoarthritis)    thumbs  . On supplemental oxygen therapy    03-06-2018 per pt uses only at night,  checks O2 sats at home,  stated am sat 89% after moving around average 93-94% with RA  . Osteoporosis   . Peripheral arterial occlusive disease [facility]) vascular-- dr chen/ dr Donzetta Matters   proximal right SFA severe focal stenosis with collaterals from the PFA/  04/ 2012 occluded celiac and SMA arteries with distal reconstitution w/ patent IMA  . Peripheral vascular disease ([place])    chronic DVT RLE,  mesenteric vascular disease  . Right lower lobe pulmonary  nodule    Chest CT 08-29-2017  . Stage 3 severe COPD by GOLD classification ([place]) hx frequent exacerbations--   pulmologist-  dr Maryann Alar--  per lov note , dated 12-20-2017, oxyogen 2L is prescribed for use with exertion (but pt only uses mostly at night), O2 sats on RA run the 80s , this day sat 86% RA and with 2L O2 sat 92%  . Varicose veins of leg with swelling    varicose vein surgery - Dr. Aleda Grana  . Wears glasses   . Wears hearing aid in both ears    Family History  Problem Relation Age of Onset  . Colon cancer Mother        colon  . COPD Mother        brown lung  . Hypertension Mother   . Diabetes Mother   . Emphysema Mother   . Coronary artery disease Father   . Heart attack Father   . Sudden death Father   . Heart disease Father   . Cancer Sister         throat  . Hypertension Sister   . Coronary artery disease Brother   . Heart attack Brother        early 46s  . Throat cancer Sister   . Ovarian cancer Sister        fallopian tube cancer in her 78s  . Melanoma Sister   . Hypertension Sister   . Pancreatic cancer Sister   . Melanoma Niece        dx in her 62s  . Breast cancer Niece 66   Past Surgical History:  Procedure Laterality Date  . BREAST EXCISIONAL BIOPSY Left 10/15/2008  . BREAST LUMPECTOMY W/ NEEDLE LOCALIZATION Right 11-09-2008    dr Brantley Stage   [facility]  . CARDIAC CATHETERIZATION  03-12-2011   dr Aundra Dubin   70-80% ostial stenosis in small second diagonal (appears to small for intervention),  dLAD 40-50%,  minimal luminal irregulartieis involoving LCFx and RCA,  LVEF 55%  . CATARACT EXTRACTION W/ INTRAOCULAR LENS  IMPLANT, BILATERAL  2011  . CYSTOSCOPY N/A 03/11/2018   Procedure: CYSTOSCOPY WITH INSTILLATION OF POST OPERATIVE EPIRUBICIN;  Surgeon: Festus Aloe, MD;  Location: [facility];  Service: Urology;  Laterality: N/A;  . MASTECTOMY Right   . PARTIAL COLECTOMY  2002   sigmoid and appectomy (diverticulitis)  . PARTIAL MASTECTOMY WITH NEEDLE LOCALIZATION Left 02/23/2013   Procedure:  LEFT PARTIAL MASTECTOMY WITH NEEDLE LOCALIZATION;  Surgeon: Adin Hector, MD;  Location: [place];  Service: General;  Laterality: Left;  . RECONSTRUCTION BREAST W/ LATISSIMUS DORSI FLAP Right 05-30-2009   dr Harlow Mares  [facility]  . REDUCTION MAMMAPLASTY Left   . SIMPLE MASTECTOMY Right 12-28-2008    dr Brantley Stage  [facility]  . TRANSTHORACIC ECHOCARDIOGRAM  07-09-2016  dr Aundra Dubin   ef 55-60%, grade 1 diastoic dysfunction/  mild TR  . TRANSURETHRAL RESECTION OF BLADDER TUMOR N/A 03/11/2018   Procedure: TRANSURETHRAL RESECTION OF BLADDER TUMOR (TURBT) 2-5cm;  Surgeon: Festus Aloe, MD;  [facility];  Service: Urology;  Laterality: N/A;  . VAGINAL HYSTERECTOMY  1973  . VIDEO ASSISTED THORACOSCOPY (VATS)/WEDGE RESECTION Right  09-11-2005  &  06-14-2011   dr Fara Boros  [facility]   both RUL    Short Social History:  Social History   Tobacco Use  . Smoking status: Former Smoker    Packs/day: 0.75    Years: 50.00    Pack years: 37.50    Types: Cigarettes  Quit date: 05/18/2016    Years since quitting: 3.4  . Smokeless tobacco: Never Used  Substance Use Topics  . Alcohol use: Yes    Alcohol/week: 10.0 standard drinks    Types: 10 Cans of beer per week    Allergies  Allergen Reactions  . Sulfa Antibiotics Swelling    Current Outpatient Medications  Medication Sig Dispense Refill  . aspirin 81 MG tablet Take 81 mg by mouth every evening.     Marland Kitchen atorvastatin (LIPITOR) 80 MG tablet TAKE 1 TABLET BY MOUTH EVERY DAY (Patient taking differently: Take 80 mg by mouth at bedtime. ) 90 tablet 3  . Biotin 1000 MCG tablet Take 1,000 mcg by mouth daily.    . cilostazol (PLETAL) 100 MG tablet Take 1 tablet (100 mg total) by mouth 2 (two) times daily. Please make overdue appt with the Doctor before anymore refills. 2nd attempt 30 tablet 0  . docusate sodium (COLACE) 100 MG capsule Take 100 mg by mouth at bedtime.    . fluticasone (FLONASE) 50 MCG/ACT nasal spray PLACE 1 SPRAY INTO BOTH NOSTRILS 2 (TWO) TIMES DAILY. 16 mL 1  . Fluticasone-Umeclidin-Vilant (TRELEGY ELLIPTA) 100-62.5-25 MCG/INH AEPB Inhale 1 puff into the lungs daily. 60 each 3  . guaiFENesin (MUCINEX) 600 MG 12 hr tablet Take 600 mg by mouth 2 (two) times daily as needed for cough or to loosen phlegm.     Marland Kitchen ipratropium-albuterol (DUONEB) 0.5-2.5 (3) MG/3ML SOLN USE 1 NEB 3 TIMES DAILY FOR 3 DAYS, THEN EVERY 6 HOURS AS NEEDED. (Patient taking differently: Inhale 3 mLs into the lungs every 6 (six) hours as needed (sob, wheezing). ) 360 mL 0  . levothyroxine (SYNTHROID) 112 MCG tablet TAKE 1 TABLET (112 MCG TOTAL) BY MOUTH DAILY BEFORE BREAKFAST. (Patient taking differently: Take 112 mcg by mouth at bedtime. ) 90 tablet 1  . LORazepam (ATIVAN) 2 MG tablet TAKE 1  TABLET BY MOUTH AT BEDTIME AS NEEDED FOR ANXIETY 30 tablet 3  . Methen-Hyosc-Meth Blue-Na Phos (ME/NAPHOS/MB/HYO1) 81.6 MG TABS Take 1 tablet by mouth 2 (two) times daily as needed.    . traZODone (DESYREL) 100 MG tablet Take 1 tablet (100 mg total) by mouth at bedtime. 90 tablet 1  . varenicline (CHANTIX STARTING MONTH PAK) 0.5 MG X 11 & 1 MG X 42 tablet Take one 0.5 mg tablet by mouth once daily for 3 days, then increase to one 0.5 mg tablet twice daily for 4 days, then increase to one 1 mg tablet twice daily. 53 tablet 0   No current facility-administered medications for this visit.    Review of Systems  Constitutional: Positive for fatigue.  HENT: HENT negative.  Eyes: Eyes negative.  Respiratory: Positive for shortness of breath and wheezing.  Cardiovascular: Cardiovascular negative.  GI: Positive for abdominal pain and diarrhea. Negative for blood in stool.  GU: Positive for hematuria.  Musculoskeletal: Musculoskeletal negative. Positive for leg pain.  Skin: Positive for rash.  Neurological: Positive for focal weakness.  Hematologic: Hematologic/lymphatic negative.  Psychiatric: Psychiatric negative.        Objective:  Objective  Vitals:   11/06/19 0955  BP: 130/85  Pulse: 75  Temp: (!) 97.4 F (36.3 C)  SpO2: 94%     Physical Exam HENT:     Head: Normocephalic.     Nose: Nose normal.  Eyes:     Pupils: Pupils are equal, round, and reactive to light.  Neck:     Vascular: No carotid bruit.  Cardiovascular:  Rate and Rhythm: Normal rate and regular rhythm.     Pulses: Normal pulses.     Heart sounds: Normal heart sounds.  Pulmonary:     Effort: Pulmonary effort is normal.  Abdominal:     General: Abdomen is flat.     Palpations: Abdomen is soft.     Comments: Well-healed midline incision  Musculoskeletal:        General: No swelling. Normal range of motion.     Cervical back: Normal range of motion.  Skin:    General: Skin is warm and dry.      Capillary Refill: Capillary refill takes less than 2 seconds.  Neurological:     General: No focal deficit present.     Mental Status: She is alert.  Psychiatric:        Mood and Affect: Mood normal.        Thought Content: Thought content normal.        Judgment: Judgment normal.     Data: I have a difficult interpreter ABIs to be 0.94 right and 1.09 left.  Toe pressure on the right is 89 and left 107.  All waveforms are triphasic.  I have independently interpreted her mesenteric duplex she demonstrates no flow in her celiac or SMA.  IMA has velocity 622 cm/s.       Assessment/Plan:    74 year old female with history of chronic mesenteric ischemia now with ongoing abdominal pain and weight loss.  Does have a history of COPD which would make her very high risk for open aorto mesenteric bypass.  She also has previous history of diverticular surgery.  I discussed with her her options being aortogram with possible intervention although I do think this would give [place] less information than CT scan.  We will proceed with CT angio chest abdomen pelvis to evaluate for possible endovascular versus open possibilities.  I discussed with her that she may be very high risk for either endovascular or open procedure and she demonstrates good understanding.  I have also stressed to her the need for caloric intake even if this is in the form of protein shakes alone this is likely her best medicine at this time.      Waynetta Sandy MD [facility]

## 2019-11-09 ENCOUNTER — Other Ambulatory Visit: Payer: Self-pay

## 2019-11-09 DIAGNOSIS — K559 Vascular disorder of intestine, unspecified: Secondary | ICD-10-CM

## 2019-11-11 ENCOUNTER — Telehealth (INDEPENDENT_AMBULATORY_CARE_PROVIDER_SITE_OTHER): Payer: Medicare Other | Admitting: Family Medicine

## 2019-11-11 ENCOUNTER — Other Ambulatory Visit: Payer: Self-pay

## 2019-11-11 DIAGNOSIS — R52 Pain, unspecified: Secondary | ICD-10-CM | POA: Diagnosis not present

## 2019-11-11 DIAGNOSIS — R208 Other disturbances of skin sensation: Secondary | ICD-10-CM | POA: Diagnosis not present

## 2019-11-11 DIAGNOSIS — F4321 Adjustment disorder with depressed mood: Secondary | ICD-10-CM | POA: Diagnosis not present

## 2019-11-11 DIAGNOSIS — I10 Essential (primary) hypertension: Secondary | ICD-10-CM

## 2019-11-11 NOTE — Progress Notes (Signed)
This visit type was conducted due to national recommendations for restrictions regarding the COVID-19 pandemic in an effort to limit this patient's exposure and mitigate transmission in our community.   Virtual Visit via Telephone Note  I connected with [name] on 11/11/19 at  5:00 PM EST by telephone and verified that I am speaking with the correct person using two identifiers.   I discussed the limitations, risks, security and privacy concerns of performing an evaluation and management service by telephone and the availability of in person appointments. I also discussed with the patient that there may be a patient responsible charge related to this service. The patient expressed understanding and agreed to proceed.  Location patient: home Location provider: work or home office Participants present for the call: patient, provider Patient did not have a visit in the prior 7 days to address this/these issue(s).   History of Present Illness: [name] had called to discuss several issues as follows.  She is somewhat tangential in her history.  She does relate that her son died Jun 26, 2023 of multiple medical complications.  He apparently had multiple chronic medical problems at baseline.  She has had a very difficult time over the past several months with grieving though she thinks this is slowly getting better.  Initial complaint was persistent "burning "sensation suprapubic area.  She saw her urologist recently and reportedly had normal urinalysis with no signs for infection.  She has not noted any skin rashes.  She is worried that this may be her "kidneys ".  She denies any fevers or chills.  No gross hematuria.  Reported history of bladder cancer followed closely by urology.  She complains of generalized aching she states "all over ".  She states her whole body including her arms and legs have been very achy.  She has some chronic joint stiffness which is really unchanged.  She had the  grieving as above.  She initially stated that she wanted an antidepressant.  She already takes trazodone 100 mg nightly.  Overall, she feels she is doing better than a month ago.  She also complains of her blood pressure has been "up and down ".  She did not have any actual readings to compare but states that she has had elevated readings at other offices but she has also felt her blood pressures been low at times.  She has multiple chronic problems including COPD, peripheral artery disease, history of breast cancer, hypothyroidism, hyperlipidemia, GERD, reported fibromyalgia  Past Medical History:  Diagnosis Date  . Bilateral leg cramps   . Bladder neoplasm   . Chronic deep vein thrombosis (DVT) of right lower extremity ([place])    05/ 2018  chronic non-occlusive DVT RLE  . COPD, frequent exacerbations ([place])    03-06-2018 per pt last exacerbation 12/ 2018  . Coronary artery disease    cardiologist-  dr Aundra Dubin-- 03-11-2018 er cath , 80% stenosis in the ostial second diagonal  . Dyspnea on minimal exertion   . Emphysema/COPD ([place])    CAT score- 17  . Family history of breast cancer   . Family history of colon cancer   . Family history of melanoma   . Family history of ovarian cancer   . Family history of pancreatic cancer   . Fibromyalgia 1995  . GERD (gastroesophageal reflux disease)   . Hiatal hernia   . History of breast cancer   . History of diverticulitis of colon    2002  s/p  sigmoid colectomy  .  History of multiple pulmonary nodules    hx RUL nodules x2  s/p right VATS w/ wedge resection 09-11-2005 and 06-14-2011  both  necrotizing granulomatous inflammation w/ cystic area of necrosis & focal calcification  . History of right breast cancer oncologist-  dr Jana Hakim-- no recurrence   dx 2010--  IDC, Stage IA , ER/PR+,  (pT1c pN0) 11-09-2008 right lumpectomy;  12-18-2008 right simple mastectomy for DCIS margins;  completed chemotherapy 2010 (no radiation) and completed antiestrogen  therapy  . Hx of colonic polyps    Dr. Cristina Gong -last study '11  . Hyperlipidemia   . Hypertension   . Hypothyroidism   . 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 Nocturia   . OA (osteoarthritis)    thumbs  . On supplemental oxygen therapy    03-06-2018 per pt uses only at night,  checks O2 sats at home,  stated am sat 89% after moving around average 93-94% with RA  . Osteoporosis   . Peripheral arterial occlusive disease [facility]) vascular-- dr chen/ dr Donzetta Matters   proximal right SFA severe focal stenosis with collaterals from the PFA/  04/ 2012 occluded celiac and SMA arteries with distal reconstitution w/ patent IMA  . Peripheral vascular disease ([place])    chronic DVT RLE,  mesenteric vascular disease  . Right lower lobe pulmonary nodule    Chest CT 08-29-2017  . Stage 3 severe COPD by GOLD classification ([place]) hx frequent exacerbations--   pulmologist-  dr Maryann Alar--  per lov note , dated 12-20-2017, oxyogen 2L is prescribed for use with exertion (but pt only uses mostly at night), O2 sats on RA run the 80s , this day sat 86% RA and with 2L O2 sat 92%  . Varicose veins of leg with swelling    varicose vein surgery - Dr. Aleda Grana  . Wears glasses   . Wears hearing aid in both ears    Past Surgical History:  Procedure Laterality Date  . BREAST EXCISIONAL BIOPSY Left 10/15/2008  . BREAST LUMPECTOMY W/ NEEDLE LOCALIZATION Right 11-09-2008    dr Brantley Stage   [facility]  . CARDIAC CATHETERIZATION  03-12-2011   dr Aundra Dubin   70-80% ostial stenosis in small second diagonal (appears to small for intervention),  dLAD 40-50%,  minimal luminal irregulartieis involoving LCFx and RCA,  LVEF 55%  . CATARACT EXTRACTION W/ INTRAOCULAR LENS  IMPLANT, BILATERAL  2011  . CYSTOSCOPY N/A 03/11/2018   Procedure: CYSTOSCOPY WITH INSTILLATION OF POST OPERATIVE EPIRUBICIN;  Surgeon: Festus Aloe, MD;  [facility];  Service: Urology;  Laterality: N/A;  . MASTECTOMY Right   .  PARTIAL COLECTOMY  2002   sigmoid and appectomy (diverticulitis)  . PARTIAL MASTECTOMY WITH NEEDLE LOCALIZATION Left 02/23/2013   Procedure:  LEFT PARTIAL MASTECTOMY WITH NEEDLE LOCALIZATION;  Surgeon: Adin Hector, MD;  Location: [place];  Service: General;  Laterality: Left;  . RECONSTRUCTION BREAST W/ LATISSIMUS DORSI FLAP Right 05-30-2009   dr Harlow Mares  [facility]  . REDUCTION MAMMAPLASTY Left   . SIMPLE MASTECTOMY Right 12-28-2008    dr Brantley Stage  [facility]  . TRANSTHORACIC ECHOCARDIOGRAM  07-09-2016  dr Aundra Dubin   ef 55-60%, grade 1 diastoic dysfunction/  mild TR  . TRANSURETHRAL RESECTION OF BLADDER TUMOR N/A 03/11/2018   Procedure: TRANSURETHRAL RESECTION OF BLADDER TUMOR (TURBT) 2-5cm;  Surgeon: Festus Aloe, MD;  [facility];  Service: Urology;  Laterality: N/A;  . VAGINAL HYSTERECTOMY  1973  . VIDEO ASSISTED  THORACOSCOPY (VATS)/WEDGE RESECTION Right 09-11-2005  &  06-14-2011   dr Fara Boros  [facility]   both RUL    reports that she quit smoking about 3 years ago. Her smoking use included cigarettes. She has a 37.50 pack-year smoking history. She has never used smokeless tobacco. She reports current alcohol use of about 10.0 standard drinks of alcohol per week. She reports that she does not use drugs. family history includes Breast cancer (age of onset: 40) in her niece; COPD in her mother; Cancer in her sister; Colon cancer in her mother; Coronary artery disease in her brother and father; Diabetes in her mother; Emphysema in her mother; Heart attack in her brother and father; Heart disease in her father; Hypertension in her mother, sister, and sister; Melanoma in her niece and sister; Ovarian cancer in her sister; Pancreatic cancer in her sister; Sudden death in her father; Throat cancer in her sister. Allergies  Allergen Reactions  . Sulfa Antibiotics Swelling      Observations/Objective: Patient sounds cheerful and well on the phone. I do not appreciate any SOB. Speech  and thought processing are grossly intact. Patient reported vitals:  Assessment and Plan:  Patient presents with multiple complaints including prolonged grieving (slowly improving), suprapubic burning sensation with reported recent normal urinalysis, diffuse body aches, and some blood pressure lability.  -We recommended an office follow-up with her primary to further evaluate especially for the joint aches and body aches though she does have reported history of fibromyalgia.  Not clear from history if this is related to that  -We discussed grieving in some detail.  Would not add SSRI to her trazodone.  We explained the limited role of antidepressants in grieving and sounds like overall she is gradually improving.  We discussed potential counseling but she is not interested at this time  -Needs an office evaluation to reassess her blood pressure and also to further evaluate her complaints of persistent burning sensation  Follow Up Instructions:  -Set up follow-up with her primary to further assess   [id number] [id number] [id number] 11-20 [id number] 21-30 I did not refer this patient for an OV in the next 24 hours for this/these issue(s).  I discussed the assessment and treatment plan with the patient. The patient was provided an opportunity to ask questions and all were answered. The patient agreed with the plan and demonstrated an understanding of the instructions.   The patient was advised to call back or seek an in-person evaluation if the symptoms worsen or if the condition fails to improve as anticipated.  I provided 26 minutes of non-face-to-face time during this encounter.   Carolann Littler, MD

## 2019-11-17 ENCOUNTER — Other Ambulatory Visit: Payer: Self-pay

## 2019-11-17 ENCOUNTER — Inpatient Hospital Stay: Payer: Medicare Other | Attending: Oncology

## 2019-11-17 DIAGNOSIS — Z90710 Acquired absence of both cervix and uterus: Secondary | ICD-10-CM | POA: Diagnosis not present

## 2019-11-17 DIAGNOSIS — Z808 Family history of malignant neoplasm of other organs or systems: Secondary | ICD-10-CM | POA: Diagnosis not present

## 2019-11-17 DIAGNOSIS — Z803 Family history of malignant neoplasm of breast: Secondary | ICD-10-CM | POA: Diagnosis not present

## 2019-11-17 DIAGNOSIS — Z7982 Long term (current) use of aspirin: Secondary | ICD-10-CM | POA: Diagnosis not present

## 2019-11-17 DIAGNOSIS — Z9011 Acquired absence of right breast and nipple: Secondary | ICD-10-CM | POA: Insufficient documentation

## 2019-11-17 DIAGNOSIS — Z8551 Personal history of malignant neoplasm of bladder: Secondary | ICD-10-CM | POA: Insufficient documentation

## 2019-11-17 DIAGNOSIS — Z8041 Family history of malignant neoplasm of ovary: Secondary | ICD-10-CM | POA: Diagnosis not present

## 2019-11-17 DIAGNOSIS — D473 Essential (hemorrhagic) thrombocythemia: Secondary | ICD-10-CM

## 2019-11-17 DIAGNOSIS — Z9221 Personal history of antineoplastic chemotherapy: Secondary | ICD-10-CM | POA: Insufficient documentation

## 2019-11-17 DIAGNOSIS — Z79899 Other long term (current) drug therapy: Secondary | ICD-10-CM | POA: Diagnosis not present

## 2019-11-17 DIAGNOSIS — C50911 Malignant neoplasm of unspecified site of right female breast: Secondary | ICD-10-CM

## 2019-11-17 DIAGNOSIS — Z170 Estrogen receptor positive status [ER+]: Secondary | ICD-10-CM | POA: Diagnosis not present

## 2019-11-17 DIAGNOSIS — Z853 Personal history of malignant neoplasm of breast: Secondary | ICD-10-CM | POA: Diagnosis present

## 2019-11-17 DIAGNOSIS — Z800 Family history of malignant neoplasm of digestive organs: Secondary | ICD-10-CM | POA: Diagnosis not present

## 2019-11-17 DIAGNOSIS — D75839 Thrombocytosis, unspecified: Secondary | ICD-10-CM

## 2019-11-17 DIAGNOSIS — Z86718 Personal history of other venous thrombosis and embolism: Secondary | ICD-10-CM | POA: Insufficient documentation

## 2019-11-17 DIAGNOSIS — I10 Essential (primary) hypertension: Secondary | ICD-10-CM | POA: Insufficient documentation

## 2019-11-17 LAB — COMPREHENSIVE METABOLIC PANEL
ALT: 14 U/L (ref 0–44)
AST: 17 U/L (ref 15–41)
Albumin: 4.1 g/dL (ref 3.5–5.0)
Alkaline Phosphatase: 60 U/L (ref 38–126)
Anion gap: 10 (ref 5–15)
BUN: 16 mg/dL (ref 8–23)
CO2: 24 mmol/L (ref 22–32)
Calcium: 9.2 mg/dL (ref 8.9–10.3)
Chloride: 104 mmol/L (ref 98–111)
Creatinine, Ser: 0.86 mg/dL (ref 0.44–1.00)
GFR calc Af Amer: >60 mL/min (ref >60)
GFR calc non Af Amer: >60 mL/min (ref >60)
Glucose, Bld: 94 mg/dL (ref 70–99)
Potassium: 4.4 mmol/L (ref 3.5–5.1)
Sodium: 138 mmol/L (ref 135–145)
Total Bilirubin: 0.6 mg/dL (ref 0.3–1.2)
Total Protein: 7.3 g/dL (ref 6.5–8.1)

## 2019-11-17 LAB — CBC WITH DIFFERENTIAL/PLATELET
Abs Immature Granulocytes: 0.01 10*3/uL (ref 0.00–0.07)
Basophils Absolute: 0.1 10*3/uL (ref 0.0–0.1)
Basophils Relative: 1 %
Eosinophils Absolute: 0.6 10*3/uL — ABNORMAL HIGH (ref 0.0–0.5)
Eosinophils Relative: 8 %
HCT: 44.8 % (ref 36.0–46.0)
Hemoglobin: 14.6 g/dL (ref 12.0–15.0)
Immature Granulocytes: 0 %
Lymphocytes Relative: 12 %
Lymphs Abs: 0.9 10*3/uL (ref 0.7–4.0)
MCH: 30.6 pg (ref 26.0–34.0)
MCHC: 32.6 g/dL (ref 30.0–36.0)
MCV: 93.9 fL (ref 80.0–100.0)
Monocytes Absolute: 0.7 10*3/uL (ref 0.1–1.0)
Monocytes Relative: 9 %
Neutro Abs: 5.5 10*3/uL (ref 1.7–7.7)
Neutrophils Relative %: 70 %
Platelets: 295 10*3/uL (ref 150–400)
RBC: 4.77 MIL/uL (ref 3.87–5.11)
RDW: 13 % (ref 11.5–15.5)
WBC: 7.8 10*3/uL (ref 4.0–10.5)
nRBC: 0 % (ref 0.0–0.2)

## 2019-11-17 LAB — C-REACTIVE PROTEIN: CRP: 0.7 mg/dL (ref <1.0)

## 2019-11-22 ENCOUNTER — Other Ambulatory Visit: Payer: Self-pay | Admitting: Nurse Practitioner

## 2019-11-22 DIAGNOSIS — I739 Peripheral vascular disease, unspecified: Secondary | ICD-10-CM

## 2019-11-23 ENCOUNTER — Other Ambulatory Visit: Payer: Self-pay

## 2019-11-23 MED ORDER — TRELEGY ELLIPTA 100-62.5-25 MCG/INH IN AEPB: 1.0000 | INHALATION_SPRAY | Freq: Every day | RESPIRATORY_TRACT | 3 refills | Status: DC

## 2019-11-23 NOTE — Telephone Encounter (Signed)
Refill request for Trelegy 100  Last OV: 07/30/2019 Next OV: Nothing scheduled  Last ordered by : Derl Barrow Quantity: 60 each 3 refills Instructions: Inhale 1 puff into the lungs daily    Allergies  Allergen Reactions  . Sulfa Antibiotics Swelling    Current Outpatient Medications on File Prior to Visit  Medication Sig Dispense Refill  . aspirin 81 MG tablet Take 81 mg by mouth every evening.     Marland Kitchen atorvastatin (LIPITOR) 80 MG tablet TAKE 1 TABLET BY MOUTH EVERY DAY (Patient taking differently: Take 80 mg by mouth at bedtime. ) 90 tablet 3  . Biotin 1000 MCG tablet Take 1,000 mcg by mouth daily.    . cilostazol (PLETAL) 100 MG tablet Take 1 tablet (100 mg total) by mouth 2 (two) times daily. 60 tablet 11  . docusate sodium (COLACE) 100 MG capsule Take 100 mg by mouth at bedtime.    . fluticasone (FLONASE) 50 MCG/ACT nasal spray PLACE 1 SPRAY INTO BOTH NOSTRILS 2 (TWO) TIMES DAILY. 16 mL 1  . Fluticasone-Umeclidin-Vilant (TRELEGY ELLIPTA) 100-62.5-25 MCG/INH AEPB Inhale 1 puff into the lungs daily. 60 each 3  . ipratropium-albuterol (DUONEB) 0.5-2.5 (3) MG/3ML SOLN USE 1 NEB 3 TIMES DAILY FOR 3 DAYS, THEN EVERY 6 HOURS AS NEEDED. (Patient taking differently: Inhale 3 mLs into the lungs every 6 (six) hours as needed (sob, wheezing). ) 360 mL 0  . levothyroxine (SYNTHROID) 112 MCG tablet TAKE 1 TABLET (112 MCG TOTAL) BY MOUTH DAILY BEFORE BREAKFAST. (Patient taking differently: Take 112 mcg by mouth at bedtime. ) 90 tablet 1  . LORazepam (ATIVAN) 2 MG tablet TAKE 1 TABLET BY MOUTH AT BEDTIME AS NEEDED FOR ANXIETY 30 tablet 3  . Methen-Hyosc-Meth Blue-Na Phos (ME/NAPHOS/MB/HYO1) 81.6 MG TABS Take 1 tablet by mouth 2 (two) times daily as needed.    Marland Kitchen omeprazole (PRILOSEC) 40 MG capsule Take 40 mg by mouth daily.    . traZODone (DESYREL) 100 MG tablet Take 1 tablet (100 mg total) by mouth at bedtime. 90 tablet 1  . varenicline (CHANTIX STARTING MONTH PAK) 0.5 MG X 11 & 1 MG X 42 tablet Take  one 0.5 mg tablet by mouth once daily for 3 days, then increase to one 0.5 mg tablet twice daily for 4 days, then increase to one 1 mg tablet twice daily. 53 tablet 0   No current facility-administered medications on file prior to visit.

## 2019-11-24 ENCOUNTER — Inpatient Hospital Stay (HOSPITAL_BASED_OUTPATIENT_CLINIC_OR_DEPARTMENT_OTHER): Payer: Medicare Other | Admitting: Adult Health

## 2019-11-24 ENCOUNTER — Other Ambulatory Visit: Payer: Self-pay

## 2019-11-24 ENCOUNTER — Encounter: Payer: Self-pay | Admitting: Adult Health

## 2019-11-24 VITALS — BP 98/71 | HR 85 | Temp 98.9°F | Resp 18 | Ht 66.0 in | Wt 139.0 lb

## 2019-11-24 DIAGNOSIS — C50811 Malignant neoplasm of overlapping sites of right female breast: Secondary | ICD-10-CM | POA: Diagnosis not present

## 2019-11-24 DIAGNOSIS — Z853 Personal history of malignant neoplasm of breast: Secondary | ICD-10-CM | POA: Diagnosis not present

## 2019-11-24 DIAGNOSIS — Z170 Estrogen receptor positive status [ER+]: Secondary | ICD-10-CM | POA: Diagnosis not present

## 2019-11-24 NOTE — Progress Notes (Signed)
CLINIC:  Survivorship   REASON FOR VISIT:  Routine follow-up for history of breast cancer.   BRIEF ONCOLOGIC HISTORY:  Per Dr. Jana Hakim note from 06/21/2017:  [place] woman: history of right breast cancer: pT1c pN0, stage IA, estrogen and progesterone receptor positive.  1. S/p right mastectomy with myocutaneous flap 09/19/2009  2. Adjuvant chemo with 4 cycles of  Taxotere/  Cytoxan  3. Femara, followed by Arimidex, started roughly Jan 2011, with a few months' interruption  4.  Completed 5 years of antiestrogen therapy June 2016  5.  Also s/p left breast lumpectomy in may 2014 ,negative for atypia or malignancy  6. Intermittent thrombocytosis, mild likely multifocal factorial (borderline iron deficiency, positive ANA and elevated C-reactive protein)   INTERVAL HISTORY:  [name] presents to the Survivorship Clinic today for routine follow-up for her history of breast cancer.  Overall, she reports feeling quite well.    She underwent a left unilateral screening mammogram with CAD and TOMO in 05/2019 that showed no evidence of malignancy and breast density category C.    Aseret has had a difficult year.  Her son passed away earlier this year, and a month later her sister passed.  She hasn't had any counseling, but continues to grieve with her family who are a good support system for her.    She continues to see her PCP Regularly.  She has had her arteries in her stomach and legs checked and is going to have her heart checked later this month.     REVIEW OF SYSTEMS:  Review of Systems  Constitutional: Negative for appetite change, chills, fatigue, fever and unexpected weight change.  HENT:   Negative for hearing loss, lump/mass and trouble swallowing.   Eyes: Negative for eye problems and icterus.  Respiratory: Negative for chest tightness, cough and shortness of breath.   Cardiovascular: Negative for chest pain, leg swelling and palpitations.  Gastrointestinal: Negative  for abdominal distention, abdominal pain, constipation, diarrhea, nausea and vomiting.  Endocrine: Negative for hot flashes.  Genitourinary: Negative for difficulty urinating.   Musculoskeletal: Negative for arthralgias and gait problem.  Skin: Negative for itching and rash.  Neurological: Negative for dizziness, extremity weakness, gait problem, headaches and numbness.  Hematological: Negative for adenopathy. Does not bruise/bleed easily.  Psychiatric/Behavioral: Negative for depression. The patient is nervous/anxious.   Breast: Denies any new nodularity, masses, tenderness, nipple changes, or nipple discharge.       PAST MEDICAL/SURGICAL HISTORY:  Past Medical History:  Diagnosis Date  . Bilateral leg cramps   . Bladder neoplasm   . Chronic deep vein thrombosis (DVT) of right lower extremity ([place])    05/ 2018  chronic non-occlusive DVT RLE  . COPD, frequent exacerbations ([place])    03-06-2018 per pt last exacerbation 12/ 2018  . Coronary artery disease    cardiologist-  dr Aundra Dubin-- 03-11-2018 er cath , 80% stenosis in the ostial second diagonal  . Dyspnea on minimal exertion   . Emphysema/COPD ([place])    CAT score- 17  . Family history of breast cancer   . Family history of colon cancer   . Family history of melanoma   . Family history of ovarian cancer   . Family history of pancreatic cancer   . Fibromyalgia 1995  . GERD (gastroesophageal reflux disease)   . Hiatal hernia   . History of breast cancer   . History of diverticulitis of colon    2002  s/p  sigmoid colectomy  . History of  multiple pulmonary nodules    hx RUL nodules x2  s/p right VATS w/ wedge resection 09-11-2005 and 06-14-2011  both  necrotizing granulomatous inflammation w/ cystic area of necrosis & focal calcification  . 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 Hx of colonic polyps    Dr. Cristina Gong -last study '11  . Hyperlipidemia   . Hypertension   . Hypothyroidism   . 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 Nocturia   . OA (osteoarthritis)    thumbs  . On supplemental oxygen therapy    03-06-2018 per pt uses only at night,  checks O2 sats at home,  stated am sat 89% after moving around average 93-94% with RA  . Osteoporosis   . Peripheral arterial occlusive disease [facility]) vascular-- dr chen/ dr Donzetta Matters   proximal right SFA severe focal stenosis with collaterals from the PFA/  04/ 2012 occluded celiac and SMA arteries with distal reconstitution w/ patent IMA  . Peripheral vascular disease ([place])    chronic DVT RLE,  mesenteric vascular disease  . Right lower lobe pulmonary nodule    Chest CT 08-29-2017  . Stage 3 severe COPD by GOLD classification ([place]) hx frequent exacerbations--   pulmologist-  dr Maryann Alar--  per lov note , dated 12-20-2017, oxyogen 2L is prescribed for use with exertion (but pt only uses mostly at night), O2 sats on RA run the 80s , this day sat 86% RA and with 2L O2 sat 92%  . Varicose veins of leg with swelling    varicose vein surgery - Dr. Aleda Grana  . Wears glasses   . Wears hearing aid in both ears    Past Surgical History:  Procedure Laterality Date  . BREAST EXCISIONAL BIOPSY Left 10/15/2008  . BREAST LUMPECTOMY W/ NEEDLE LOCALIZATION Right 11-09-2008    dr Brantley Stage   [facility]  . CARDIAC CATHETERIZATION  03-12-2011   dr Aundra Dubin   70-80% ostial stenosis in small second diagonal (appears to small for intervention),  dLAD 40-50%,  minimal luminal irregulartieis involoving LCFx and RCA,  LVEF 55%  . CATARACT EXTRACTION W/ INTRAOCULAR LENS  IMPLANT, BILATERAL  2011  . CYSTOSCOPY N/A 03/11/2018   Procedure: CYSTOSCOPY WITH INSTILLATION OF POST OPERATIVE EPIRUBICIN;  Surgeon: Festus Aloe, MD;   Location: [facility];  Service: Urology;  Laterality: N/A;  . MASTECTOMY Right   . PARTIAL COLECTOMY  2002   sigmoid and appectomy (diverticulitis)  . PARTIAL MASTECTOMY WITH NEEDLE LOCALIZATION Left 02/23/2013   Procedure:  LEFT PARTIAL MASTECTOMY WITH NEEDLE LOCALIZATION;  Surgeon: Adin Hector, MD;  Location: [place];  Service: General;  Laterality: Left;  . RECONSTRUCTION BREAST W/ LATISSIMUS DORSI FLAP Right 05-30-2009   dr Harlow Mares  [facility]  . REDUCTION MAMMAPLASTY Left   . SIMPLE MASTECTOMY Right 12-28-2008    dr Brantley Stage  [facility]  . TRANSTHORACIC ECHOCARDIOGRAM  07-09-2016  dr Aundra Dubin   ef 55-60%, grade 1 diastoic dysfunction/  mild TR  . TRANSURETHRAL RESECTION OF BLADDER TUMOR N/A 03/11/2018   Procedure: TRANSURETHRAL RESECTION OF BLADDER TUMOR (TURBT) 2-5cm;  Surgeon: Festus Aloe, MD;  Location: [facility];  Service: Urology;  Laterality: N/A;  . VAGINAL HYSTERECTOMY  1973  . VIDEO ASSISTED THORACOSCOPY (VATS)/WEDGE  RESECTION Right 09-11-2005  &  06-14-2011   dr hendrickso  [facility]   both RUL     ALLERGIES:  Allergies  Allergen Reactions  . Sulfa Antibiotics Swelling     CURRENT MEDICATIONS:  Outpatient Encounter Medications as of 11/24/2019  Medication Sig  . aspirin 81 MG tablet Take 81 mg by mouth every evening.   Marland Kitchen atorvastatin (LIPITOR) 80 MG tablet TAKE 1 TABLET BY MOUTH EVERY DAY (Patient taking differently: Take 80 mg by mouth at bedtime. )  . Biotin 1000 MCG tablet Take 1,000 mcg by mouth daily.  . cilostazol (PLETAL) 100 MG tablet Take 1 tablet (100 mg total) by mouth 2 (two) times daily.  Marland Kitchen docusate sodium (COLACE) 100 MG capsule Take 100 mg by mouth at bedtime.  . fluticasone (FLONASE) 50 MCG/ACT nasal spray PLACE 1 SPRAY INTO BOTH NOSTRILS 2 (TWO) TIMES DAILY.  Marland Kitchen Fluticasone-Umeclidin-Vilant (TRELEGY ELLIPTA) 100-62.5-25 MCG/INH AEPB Inhale 1 puff into the lungs daily.  Marland Kitchen ipratropium-albuterol (DUONEB) 0.5-2.5 (3) MG/3ML SOLN USE 1 NEB 3 TIMES DAILY FOR  3 DAYS, THEN EVERY 6 HOURS AS NEEDED. (Patient taking differently: Inhale 3 mLs into the lungs every 6 (six) hours as needed (sob, wheezing). )  . levothyroxine (SYNTHROID) 112 MCG tablet TAKE 1 TABLET (112 MCG TOTAL) BY MOUTH DAILY BEFORE BREAKFAST. (Patient taking differently: Take 112 mcg by mouth at bedtime. )  . LORazepam (ATIVAN) 2 MG tablet TAKE 1 TABLET BY MOUTH AT BEDTIME AS NEEDED FOR ANXIETY  . omeprazole (PRILOSEC) 40 MG capsule Take 40 mg by mouth daily.  . traZODone (DESYREL) 100 MG tablet Take 1 tablet (100 mg total) by mouth at bedtime.  . varenicline (CHANTIX STARTING MONTH PAK) 0.5 MG X 11 & 1 MG X 42 tablet Take one 0.5 mg tablet by mouth once daily for 3 days, then increase to one 0.5 mg tablet twice daily for 4 days, then increase to one 1 mg tablet twice daily.  . [DISCONTINUED] Methen-Hyosc-Meth Blue-Na Phos (ME/NAPHOS/MB/HYO1) 81.6 MG TABS Take 1 tablet by mouth 2 (two) times daily as needed.   No facility-administered encounter medications on file as of 11/24/2019.     ONCOLOGIC FAMILY HISTORY:  Family History  Problem Relation Age of Onset  . Colon cancer Mother        colon  . COPD Mother        brown lung  . Hypertension Mother   . Diabetes Mother   . Emphysema Mother   . Coronary artery disease Father   . Heart attack Father   . Sudden death Father   . Heart disease Father   . Cancer Sister        throat  . Hypertension Sister   . Coronary artery disease Brother   . Heart attack Brother        early 61s  . Throat cancer Sister   . Ovarian cancer Sister        fallopian tube cancer in her 66s  . Melanoma Sister   . Hypertension Sister   . Pancreatic cancer Sister   . Melanoma Niece        dx in her 50s  . Breast cancer Niece 5    GENETIC COUNSELING/TESTING: Genetic testing reported out on December 09, 2018 through the multi-cancer panel found no deleterious mutations.  The Multi-Gene Panel offered by [facility] includes sequencing and/or deletion  duplication testing of the following 84 genes: AIP, ALK, APC, ATM, AXIN2,BAP1,  BARD1, BLM, BMPR1A, BRCA1, BRCA2, BRIP1,  CASR, CDC73, CDH1, CDK4, CDKN1B, CDKN1C, CDKN2A (p14ARF), CDKN2A (p16INK4a), CEBPA, CHEK2, CTNNA1, DICER1, DIS3L2, EGFR (c.2369C>T, p.Thr790Met variant only), EPCAM (Deletion/duplication testing only), FH, FLCN, GATA2, GPC3, GREM1 (Promoter region deletion/duplication testing only), HOXB13 (c.251G>A, p.Gly84Glu), HRAS, KIT, MAX, MEN1, MET, MITF (c.952G>A, p.Glu318Lys variant only), MLH1, MSH2, MSH3, MSH6, MUTYH, NBN, NF1, NF2, NTHL1, PALB2, PDGFRA, PHOX2B, PMS2, POLD1, POLE, POT1, PRKAR1A, PTCH1, PTEN, RAD50, RAD51C, RAD51D, RB1, RECQL4, RET, RUNX1, SDHAF2, SDHA (sequence changes only), SDHB, SDHC, SDHD, SMAD4, SMARCA4, SMARCB1, SMARCE1, STK11, SUFU, TERC, TERT, TMEM127, TP53, TSC1, TSC2, VHL, WRN and WT1.   The test report has been scanned into EPIC and is located under the Molecular Pathology section of the Results Review tab.   SOCIAL HISTORY:  ALINNA SIPLE is married and lives with her husband in [place], [place].  Her son recently passed away.  Her younger son recently moved in to help care for her and her husband.  Ms. Ludke is currently retired.  She denies any current or history of tobacco, alcohol, or illicit drug use.  (reviewed 11/24/2019)   PHYSICAL EXAMINATION:  Vital Signs: Vitals:   11/24/19 1508  BP: 98/71  Pulse: 85  Resp: 18  Temp: 98.9 F (37.2 C)  SpO2: 96%   Filed Weights   11/24/19 1508  Weight: 139 lb (63 kg)   General: Well-nourished, well-appearing female in no acute distress.  Unaccompanied today.   HEENT: Head is normocephalic.  Pupils equal and reactive to light. Conjunctivae clear without exudate.  Sclerae anicteric. Oral mucosa is pink, moist.  Oropharynx is pink without lesions or erythema.  Lymph: No cervical, supraclavicular, or infraclavicular lymphadenopathy noted on palpation.  Cardiovascular: Regular rate and  rhythm.Marland Kitchen Respiratory: Clear to auscultation bilaterally. Chest expansion symmetric; breathing non-labored.  Breast Exam:  -Left breast: No appreciable masses on palpation. No skin redness, thickening, or peau d'orange appearance; no nipple retraction or nipple discharge; mild distortion in symmetry at previous lumpectomy site well healed scar without erythema or nodularity.  -Right breast: s/p mastecomy and reconstruction, well healed, no swelling or sign of recurrence. -Axilla: No axillary adenopathy bilaterally.  GI: Abdomen soft and round; non-tender, non-distended. Bowel sounds normoactive. No hepatosplenomegaly.   GU: Deferred.  Neuro: No focal deficits. Steady gait.  Psych: Mood and affect normal and appropriate for situation.  MSK: No focal spinal tenderness to palpation, full range of motion in bilateral upper extremities Extremities: No edema. Skin: Warm and dry.  LABORATORY DATA: No visits with results within 1 Day(s) from this visit.  Latest known visit with results is:  Appointment on 11/17/2019  Component Date Value Ref Range Status  . CRP 11/17/2019 0.7  <1.0 mg/dL Final   Performed at [facility] [address] [place], [place] [address]  . Sodium 11/17/2019 138  135 - 145 mmol/L Final  . Potassium 11/17/2019 4.4  3.5 - 5.1 mmol/L Final  . Chloride 11/17/2019 104  98 - 111 mmol/L Final  . CO2 11/17/2019 24  22 - 32 mmol/L Final  . Glucose, Bld 11/17/2019 94  70 - 99 mg/dL Final  . BUN 11/17/2019 16  8 - 23 mg/dL Final  . Creatinine, Ser 11/17/2019 0.86  0.44 - 1.00 mg/dL Final  . Calcium 11/17/2019 9.2  8.9 - 10.3 mg/dL Final  . Total Protein 11/17/2019 7.3  6.5 - 8.1 g/dL Final  . Albumin 11/17/2019 4.1  3.5 - 5.0 g/dL Final  . AST 11/17/2019 17  15 - 41 U/L Final  . ALT  11/17/2019 14  0 - 44 U/L Final  . Alkaline Phosphatase 11/17/2019 60  38 - 126 U/L Final  . Total Bilirubin 11/17/2019 0.6  0.3 - 1.2 mg/dL Final  . GFR calc non Af Amer  11/17/2019 >60  >60 mL/min Final  . GFR calc Af Amer 11/17/2019 >60  >60 mL/min Final  . Anion gap 11/17/2019 10  5 - 15 Final   Performed at [facility], [place] [address], [place], [place] [address]  . WBC 11/17/2019 7.8  4.0 - 10.5 K/uL Final  . RBC 11/17/2019 4.77  3.87 - 5.11 MIL/uL Final  . Hemoglobin 11/17/2019 14.6  12.0 - 15.0 g/dL Final  . HCT 11/17/2019 44.8  36.0 - 46.0 % Final  . MCV 11/17/2019 93.9  80.0 - 100.0 fL Final  . MCH 11/17/2019 30.6  26.0 - 34.0 pg Final  . MCHC 11/17/2019 32.6  30.0 - 36.0 g/dL Final  . RDW 11/17/2019 13.0  11.5 - 15.5 % Final  . Platelets 11/17/2019 295  150 - 400 K/uL Final  . nRBC 11/17/2019 0.0  0.0 - 0.2 % Final  . Neutrophils Relative % 11/17/2019 70  % Final  . Neutro Abs 11/17/2019 5.5  1.7 - 7.7 K/uL Final  . Lymphocytes Relative 11/17/2019 12  % Final  . Lymphs Abs 11/17/2019 0.9  0.7 - 4.0 K/uL Final  . Monocytes Relative 11/17/2019 9  % Final  . Monocytes Absolute 11/17/2019 0.7  0.1 - 1.0 K/uL Final  . Eosinophils Relative 11/17/2019 8  % Final  . Eosinophils Absolute 11/17/2019 0.6* 0.0 - 0.5 K/uL Final  . Basophils Relative 11/17/2019 1  % Final  . Basophils Absolute 11/17/2019 0.1  0.0 - 0.1 K/uL Final  . Immature Granulocytes 11/17/2019 0  % Final  . Abs Immature Granulocytes 11/17/2019 0.01  0.00 - 0.07 K/uL Final   Performed at [facility], [place] [address] [place], [place] [address]      DIAGNOSTIC IMAGING:  CLINICAL DATA:  Screening.  EXAM: DIGITAL SCREENING UNILATERAL LEFT MAMMOGRAM WITH CAD AND TOMO  COMPARISON:  Previous exam(s).  ACR Breast Density Category c: The breast tissue is heterogeneously dense, which may obscure small masses.  FINDINGS: The patient has had a right mastectomy. There are no findings suspicious for malignancy.  Images were processed with CAD.  IMPRESSION: No mammographic evidence of malignancy. A result letter of  this screening mammogram will be mailed directly to the patient.  RECOMMENDATION: Screening mammogram in one year.  (Code:SM-L-57M)  BI-RADS CATEGORY  1: Negative.   Electronically Signed   By: Dorise Bullion III M.D   On: 06/04/2019 17:09     ASSESSMENT AND PLAN:  Ms.. Kiesel is a pleasant 74 y.o. female with history of Stage IA right breast invasive ductal carcinoma, ER+/PR+/HER2-, diagnosed in 2010, treated with mastectomy, adjuvant chemotherapy, Femara/Arimidex x 5 years completed in 03/2015.  Of note she is s/p left lumpectomy in 02/2013 neg for atypia or malignancy.  She also has intermittent thrombocytosis.  She presents to the Survivorship Clinic for surveillance and routine follow-up.   1. History of breast cancer:  Ms. Gronewold is currently clinically and radiographically without evidence of disease or recurrence of breast cancer.  She is due for repeat mammogram in 05/2020.  She will return to see me in one year for repeat long term follow up.  I encouraged her to call me with any questions or concerns before her next visit at  the cancer center, and I would be happy to see her sooner, if needed.    2. Bladder cancer: Managed by Dr. Awanda Mink, last check about 3 weeks ago.  To f/u in 6 months with him as scheduled.  3. Bone health:  Given Ms. Guilfoil's age, history of breast cancer, and her previous anti-estrogen therapy with aromatase inhibitors, she is at risk for bone demineralization. I will defer to her PCP regarding bone density testing and management.  She was given education on specific food and activities to promote bone health.  4. Cancer screening:  Due to Ms. Borchard's history and her age, she should receive screening for skin cancers, colon cancer. She was encouraged to follow-up with her PCP for appropriate cancer screenings.   5. Health maintenance and wellness promotion: Ms. Arey was encouraged to consume 5-7 servings of fruits and vegetables per day. She was also  encouraged to engage in moderate to vigorous exercise for 30 minutes per day most days of the week. She was instructed to limit her alcohol consumption and continue to abstain from tobacco use.     Dispo:  -Return to cancer center in one year for LTS follow up -Mammogram in 05/2020   Total encounter time: 20 minutes    Gardenia Phlegm, NP [place] [phone]  *Total Encounter Time as defined by the Centers for Medicare and Medicaid Services includes, in addition to the face-to-face time of a patient visit (documented in the note above) non-face-to-face time: obtaining and reviewing outside history, ordering and reviewing medications, tests or procedures, care coordination (communications with other health care professionals or caregivers) and documentation in the medical record.   Note: PRIMARY CARE PROVIDER [place], [name], [place] [phone]

## 2019-11-25 ENCOUNTER — Telehealth: Payer: Self-pay | Admitting: Adult Health

## 2019-11-25 NOTE — Telephone Encounter (Signed)
I  Talk with patient regarding schedule

## 2019-12-01 ENCOUNTER — Ambulatory Visit
Admission: RE | Admit: 2019-12-01 | Discharge: 2019-12-01 | Disposition: A | Discharge status: Home / Self Care | Payer: Medicare Other | Source: Ambulatory Visit | Attending: Vascular Surgery | Admitting: Vascular Surgery

## 2019-12-01 ENCOUNTER — Other Ambulatory Visit: Payer: Self-pay

## 2019-12-01 DIAGNOSIS — K559 Vascular disorder of intestine, unspecified: Secondary | ICD-10-CM

## 2019-12-01 MED ORDER — IOPAMIDOL (ISOVUE-370) INJECTION 76%
75.0000 mL | Freq: Once | INTRAVENOUS | Status: AC | PRN
  Administered 2019-12-01: 75 mL via INTRAVENOUS

## 2019-12-04 ENCOUNTER — Encounter: Payer: Self-pay | Admitting: Vascular Surgery

## 2019-12-04 ENCOUNTER — Ambulatory Visit (INDEPENDENT_AMBULATORY_CARE_PROVIDER_SITE_OTHER): Payer: Medicare Other | Admitting: Vascular Surgery

## 2019-12-04 ENCOUNTER — Other Ambulatory Visit: Payer: Self-pay

## 2019-12-04 ENCOUNTER — Ambulatory Visit: Payer: Medicare Other | Admitting: Vascular Surgery

## 2019-12-04 VITALS — BP 108/64 | HR 95 | Temp 97.2°F | Resp 20 | Ht 66.0 in | Wt 138.0 lb

## 2019-12-04 DIAGNOSIS — K551 Chronic vascular disorders of intestine: Secondary | ICD-10-CM | POA: Diagnosis not present

## 2019-12-04 NOTE — Progress Notes (Signed)
Patient ID: [name], female   DOB: [birth year], 74 y.o.   MRN: [id number]  Reason for Consult: Follow-up   Referred by [place], [name], MD  Subjective:     HPI:  [name] is a 74 y.o. female history of COPD and hypertension had previously been evaluated for mesenteric bypass surgery this was not pursued at the time with Dr. Bridgett Larsson.  Later I saw her for lower extremity skin issues but these have resolved.  I recently saw her for lack of appetite with postprandial pain and diarrhea.  At that time she had lost weight but did not know how much better.  To have lost approximately 10 pounds from her visit in 2018.  She states that since last visit she is no longer having abdominal pain.  She continues to have diarrhea although no blood in her stool.  She thinks that she has gained 2 pounds since last visit.  Continues on aspirin and a statin.  Past Medical History:  Diagnosis Date  . Bilateral leg cramps   . Bladder neoplasm   . Chronic deep vein thrombosis (DVT) of right lower extremity ([place])    05/ 2018  chronic non-occlusive DVT RLE  . COPD, frequent exacerbations ([place])    03-06-2018 per pt last exacerbation 12/ 2018  . Coronary artery disease    cardiologist-  dr Aundra Dubin-- 03-11-2018 er cath , 80% stenosis in the ostial second diagonal  . Dyspnea on minimal exertion   . Emphysema/COPD ([place])    CAT score- 17  . Family history of breast cancer   . Family history of colon cancer   . Family history of melanoma   . Family history of ovarian cancer   . Family history of pancreatic cancer   . Fibromyalgia 1995  . GERD (gastroesophageal reflux disease)   . Hiatal hernia   . History of breast cancer   . History of diverticulitis of colon    2002  s/p  sigmoid colectomy  . History of multiple pulmonary nodules    hx RUL nodules x2  s/p right VATS w/ wedge resection 09-11-2005 and 06-14-2011  both  necrotizing granulomatous inflammation w/ cystic area of necrosis & focal  calcification  . History of right breast cancer oncologist-  dr Jana Hakim-- no recurrence   dx 2010--  IDC, Stage IA , ER/PR+,  (pT1c pN0) 11-09-2008 right lumpectomy;  12-18-2008 right simple mastectomy for DCIS margins;  completed chemotherapy 2010 (no radiation) and completed antiestrogen therapy  . Hx of colonic polyps    Dr. Cristina Gong -last study '11  . Hyperlipidemia   . Hypertension   . Hypothyroidism   . 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 Nocturia   . OA (osteoarthritis)    thumbs  . On supplemental oxygen therapy    03-06-2018 per pt uses only at night,  checks O2 sats at home,  stated am sat 89% after moving around average 93-94% with RA  . Osteoporosis   . Peripheral arterial occlusive disease [facility]) vascular-- dr chen/ dr Donzetta Matters   proximal right SFA severe focal stenosis with collaterals from the PFA/  04/ 2012 occluded celiac and SMA arteries with distal reconstitution w/ patent IMA  . Peripheral vascular disease ([place])    chronic DVT RLE,  mesenteric vascular disease  . Right lower lobe pulmonary nodule    Chest CT 08-29-2017  . Stage 3 severe COPD by GOLD classification (HCC) hx frequent exacerbations--  pulmologist-  dr Maryann Alar--  per lov note , dated 12-20-2017, oxyogen 2L is prescribed for use with exertion (but pt only uses mostly at night), O2 sats on RA run the 80s , this day sat 86% RA and with 2L O2 sat 92%  . Varicose veins of leg with swelling    varicose vein surgery - Dr. Aleda Grana  . Wears glasses   . Wears hearing aid in both ears    Family History  Problem Relation Age of Onset  . Colon cancer Mother        colon  . COPD Mother        brown lung  . Hypertension Mother   . Diabetes Mother   . Emphysema Mother   . Coronary artery disease Father   . Heart attack Father   . Sudden death Father   . Heart disease Father   . Cancer Sister        throat  . Hypertension Sister   . Coronary artery disease Brother     . Heart attack Brother        early 87s  . Throat cancer Sister   . Ovarian cancer Sister        fallopian tube cancer in her 5s  . Melanoma Sister   . Hypertension Sister   . Pancreatic cancer Sister   . Melanoma Niece        dx in her 50s  . Breast cancer Niece 92   Past Surgical History:  Procedure Laterality Date  . BREAST EXCISIONAL BIOPSY Left 10/15/2008  . BREAST LUMPECTOMY W/ NEEDLE LOCALIZATION Right 11-09-2008    dr Brantley Stage   [facility]  . CARDIAC CATHETERIZATION  03-12-2011   dr Aundra Dubin   70-80% ostial stenosis in small second diagonal (appears to small for intervention),  dLAD 40-50%,  minimal luminal irregulartieis involoving LCFx and RCA,  LVEF 55%  . CATARACT EXTRACTION W/ INTRAOCULAR LENS  IMPLANT, BILATERAL  2011  . CYSTOSCOPY N/A 03/11/2018   Procedure: CYSTOSCOPY WITH INSTILLATION OF POST OPERATIVE EPIRUBICIN;  Surgeon: Festus Aloe, MD;  [facility];  Service: Urology;  Laterality: N/A;  . MASTECTOMY Right   . PARTIAL COLECTOMY  2002   sigmoid and appectomy (diverticulitis)  . PARTIAL MASTECTOMY WITH NEEDLE LOCALIZATION Left 02/23/2013   Procedure:  LEFT PARTIAL MASTECTOMY WITH NEEDLE LOCALIZATION;  Surgeon: Adin Hector, MD;  Location: [place];  Service: General;  Laterality: Left;  . RECONSTRUCTION BREAST W/ LATISSIMUS DORSI FLAP Right 05-30-2009   dr Harlow Mares  [facility]  . REDUCTION MAMMAPLASTY Left   . SIMPLE MASTECTOMY Right 12-28-2008    dr Brantley Stage  [facility]  . TRANSTHORACIC ECHOCARDIOGRAM  07-09-2016  dr Aundra Dubin   ef 55-60%, grade 1 diastoic dysfunction/  mild TR  . TRANSURETHRAL RESECTION OF BLADDER TUMOR N/A 03/11/2018   Procedure: TRANSURETHRAL RESECTION OF BLADDER TUMOR (TURBT) 2-5cm;  Surgeon: Festus Aloe, MD;  [facility];  Service: Urology;  Laterality: N/A;  . VAGINAL HYSTERECTOMY  1973  . VIDEO ASSISTED THORACOSCOPY (VATS)/WEDGE RESECTION Right 09-11-2005  &  06-14-2011   dr Fara Boros  [facility]   both RUL    Short Social  History:  Social History   Tobacco Use  . Smoking status: Former Smoker    Packs/day: 0.75    Years: 50.00    Pack years: 37.50    Types: Cigarettes    Quit date: 05/18/2016    Years since quitting: 3.5  . Smokeless tobacco: Never Used  Substance  Use Topics  . Alcohol use: Yes    Alcohol/week: 10.0 standard drinks    Types: 10 Cans of beer per week    Allergies  Allergen Reactions  . Sulfa Antibiotics Swelling    Current Outpatient Medications  Medication Sig Dispense Refill  . aspirin 81 MG tablet Take 81 mg by mouth every evening.     Marland Kitchen atorvastatin (LIPITOR) 80 MG tablet TAKE 1 TABLET BY MOUTH EVERY DAY (Patient taking differently: Take 80 mg by mouth at bedtime. ) 90 tablet 3  . Biotin 1000 MCG tablet Take 1,000 mcg by mouth daily.    . cilostazol (PLETAL) 100 MG tablet Take 1 tablet (100 mg total) by mouth 2 (two) times daily. 60 tablet 11  . docusate sodium (COLACE) 100 MG capsule Take 100 mg by mouth at bedtime.    . fluticasone (FLONASE) 50 MCG/ACT nasal spray PLACE 1 SPRAY INTO BOTH NOSTRILS 2 (TWO) TIMES DAILY. 16 mL 1  . Fluticasone-Umeclidin-Vilant (TRELEGY ELLIPTA) 100-62.5-25 MCG/INH AEPB Inhale 1 puff into the lungs daily. 60 each 3  . ipratropium-albuterol (DUONEB) 0.5-2.5 (3) MG/3ML SOLN USE 1 NEB 3 TIMES DAILY FOR 3 DAYS, THEN EVERY 6 HOURS AS NEEDED. (Patient taking differently: Inhale 3 mLs into the lungs every 6 (six) hours as needed (sob, wheezing). ) 360 mL 0  . levothyroxine (SYNTHROID) 112 MCG tablet TAKE 1 TABLET (112 MCG TOTAL) BY MOUTH DAILY BEFORE BREAKFAST. (Patient taking differently: Take 112 mcg by mouth at bedtime. ) 90 tablet 1  . LORazepam (ATIVAN) 2 MG tablet TAKE 1 TABLET BY MOUTH AT BEDTIME AS NEEDED FOR ANXIETY 30 tablet 3  . omeprazole (PRILOSEC) 40 MG capsule Take 40 mg by mouth daily.    . traZODone (DESYREL) 100 MG tablet Take 1 tablet (100 mg total) by mouth at bedtime. 90 tablet 1  . varenicline (CHANTIX STARTING MONTH PAK) 0.5 MG X 11  & 1 MG X 42 tablet Take one 0.5 mg tablet by mouth once daily for 3 days, then increase to one 0.5 mg tablet twice daily for 4 days, then increase to one 1 mg tablet twice daily. 53 tablet 0   No current facility-administered medications for this visit.    Review of Systems  Constitutional:  Constitutional negative. HENT: HENT negative.  Eyes: Eyes negative.  Respiratory: Respiratory negative.  Cardiovascular: Cardiovascular negative.  GI: Positive for diarrhea. Negative for abdominal pain.  Musculoskeletal: Positive for back pain.  Skin: Skin negative.  Neurological: Neurological negative. Hematologic: Hematologic/lymphatic negative.  Psychiatric: Psychiatric negative.        Objective:  Objective   Vitals:   12/04/19 1501  BP: 108/64  Pulse: 95  Resp: 20  Temp: (!) 97.2 F (36.2 C)  SpO2: 97%  Weight: 138 lb (62.6 kg)  Height: 5\' 6"  (1.676 m)   Body mass index is 22.27 kg/m.  Physical Exam Constitutional:      Appearance: Normal appearance.  HENT:     Head: Normocephalic.     Nose:     Comments: Mask in place Eyes:     Pupils: Pupils are equal, round, and reactive to light.  Cardiovascular:     Rate and Rhythm: Normal rate.     Pulses:          Dorsalis pedis pulses are 2+ on the left side.       Posterior tibial pulses are 2+ on the right side.  Pulmonary:     Eff[place]: Pulmonary effort is normal.  Abdominal:  General: Abdomen is flat.     Palpations: Abdomen is soft. There is no mass.  Musculoskeletal:        General: Normal range of motion.  Skin:    General: Skin is dry.     Capillary Refill: Capillary refill takes less than 2 seconds.  Neurological:     General: No focal deficit present.     Mental Status: She is alert.  Psychiatric:        Mood and Affect: Mood normal.        Behavior: Behavior normal.        Thought Content: Thought content normal.        Judgment: Judgment normal.     Data: We reviewed her CT scan together.  Appears  to have a short segment occlusion of her celiac artery with all 3 main branches patent.  SMA has long segment occlusion.  IMA is a very large vessel this may be a component of poststenotic dilatation as it appears at the origin possibly has at least 50% stenosis.  CT IMPRESSION: 1. Negative for acute PE or thoracic aortic dissection. 2. Proximal left common carotid artery stenosis of possible hemodynamic significance. 3. Proximal celiac and SMA occlusions with enlarged IMA collaterals providing the majority of mesenteric circulation, at risk of mesenteric ischemia . 4. Small hiatal hernia. 5. Coronary  and  aortic Atherosclerosis (ICD10-170.0). 6.  Emphysema (ICD10-J43.9).      Assessment/Plan:     74 year old female follows up for chronic mesenteric ischemia.  Appears that her postprandial pain has improved and using protein shake she has gained 2 pounds although this is not reflected on our weight today and her BMI remains 22.  Given that she is relatively asymptomatic other than diarrhea at this time we will hold on angiography.  I discussed with her that if her symptoms return we would consider angiography with possible stenting of her celiac artery which would need to be done with noncovered stent most likely and also evaluation of her IMA which possibly has stenosis at the origin.  She demonstrates good understanding.  Short of her having recurrent symptoms we will see her back in 6 months with repeat mesenteric duplex.     Waynetta Sandy MD [facility]

## 2019-12-07 ENCOUNTER — Other Ambulatory Visit: Payer: Self-pay | Admitting: *Deleted

## 2019-12-07 DIAGNOSIS — K551 Chronic vascular disorders of intestine: Secondary | ICD-10-CM

## 2019-12-09 ENCOUNTER — Other Ambulatory Visit: Payer: Self-pay | Admitting: Nurse Practitioner

## 2019-12-09 ENCOUNTER — Other Ambulatory Visit: Payer: Self-pay | Admitting: *Deleted

## 2019-12-09 MED ORDER — VARENICLINE TARTRATE 1 MG PO TABS: 1.0000 mg | ORAL_TABLET | Freq: Two times a day (BID) | ORAL | 1 refills | Status: DC

## 2019-12-09 NOTE — Progress Notes (Signed)
error 

## 2019-12-20 ENCOUNTER — Other Ambulatory Visit: Payer: Self-pay | Admitting: Family Medicine

## 2019-12-20 DIAGNOSIS — E039 Hypothyroidism, unspecified: Secondary | ICD-10-CM

## 2019-12-20 DIAGNOSIS — K219 Gastro-esophageal reflux disease without esophagitis: Secondary | ICD-10-CM

## 2019-12-25 ENCOUNTER — Ambulatory Visit: Payer: Medicare Other | Admitting: Vascular Surgery

## 2019-12-28 ENCOUNTER — Encounter: Payer: Self-pay | Admitting: Family Medicine

## 2019-12-28 ENCOUNTER — Telehealth (INDEPENDENT_AMBULATORY_CARE_PROVIDER_SITE_OTHER): Payer: Medicare Other | Admitting: Family Medicine

## 2019-12-28 VITALS — BP 116/74 | HR 84 | Ht 66.0 in | Wt 140.0 lb

## 2019-12-28 DIAGNOSIS — J440 Chronic obstructive pulmonary disease with acute lower respiratory infection: Secondary | ICD-10-CM

## 2019-12-28 DIAGNOSIS — J209 Acute bronchitis, unspecified: Secondary | ICD-10-CM

## 2019-12-28 MED ORDER — AZITHROMYCIN 250 MG PO TABS: ORAL_TABLET | ORAL | 0 refills | Status: DC

## 2019-12-28 MED ORDER — METHYLPREDNISOLONE 4 MG PO TBPK: ORAL_TABLET | ORAL | 0 refills | Status: DC

## 2019-12-28 NOTE — Progress Notes (Signed)
Virtual Visit via Video Note  I connected with the patient on 12/28/19 at  3:45 PM EDT by a video enabled telemedicine application and verified that I am speaking with the correct person using two identifiers.  Location patient: home Location provider:work or home office Persons participating in the virtual visit: patient, provider  I discussed the limitations of evaluation and management by telemedicine and the availability of in person appointments. The patient expressed understanding and agreed to proceed.   HPI: Here with her son for 5 days of stuffy head, PND, chest congestion, wheezing, and a dry cough. No fever or chest pain. She wears continuous oxygen due to COPD, and she has a nebulizer with Duonebs to use as needed.    ROS: See pertinent positives and negatives per HPI.  Past Medical History:  Diagnosis Date  . Bilateral leg cramps   . Bladder neoplasm   . Chronic deep vein thrombosis (DVT) of right lower extremity ([place])    05/ 2018  chronic non-occlusive DVT RLE  . COPD, frequent exacerbations ([place])    03-06-2018 per pt last exacerbation 12/ 2018  . Coronary artery disease    cardiologist-  dr Aundra Dubin-- 03-11-2018 er cath , 80% stenosis in the ostial second diagonal  . Dyspnea on minimal exertion   . Emphysema/COPD ([place])    CAT score- 17  . Family history of breast cancer   . Family history of colon cancer   . Family history of melanoma   . Family history of ovarian cancer   . Family history of pancreatic cancer   . Fibromyalgia 1995  . GERD (gastroesophageal reflux disease)   . Hiatal hernia   . History of breast cancer   . History of diverticulitis of colon    2002  s/p  sigmoid colectomy  . History of multiple pulmonary nodules    hx RUL nodules x2  s/p right VATS w/ wedge resection 09-11-2005 and 06-14-2011  both  necrotizing granulomatous inflammation w/ cystic area of necrosis & focal calcification  . History of right breast cancer oncologist-  dr Jana Hakim--  no recurrence   dx 2010--  IDC, Stage IA , ER/PR+,  (pT1c pN0) 11-09-2008 right lumpectomy;  12-18-2008 right simple mastectomy for DCIS margins;  completed chemotherapy 2010 (no radiation) and completed antiestrogen therapy  . Hx of colonic polyps    Dr. Cristina Gong -last study '11  . Hyperlipidemia   . Hypertension   . Hypothyroidism   . 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 Nocturia   . OA (osteoarthritis)    thumbs  . On supplemental oxygen therapy    03-06-2018 per pt uses only at night,  checks O2 sats at home,  stated am sat 89% after moving around average 93-94% with RA  . Osteoporosis   . Peripheral arterial occlusive disease [facility]) vascular-- dr chen/ dr Donzetta Matters   proximal right SFA severe focal stenosis with collaterals from the PFA/  04/ 2012 occluded celiac and SMA arteries with distal reconstitution w/ patent IMA  . Peripheral vascular disease ([place])    chronic DVT RLE,  mesenteric vascular disease  . Right lower lobe pulmonary nodule    Chest CT 08-29-2017  . Stage 3 severe COPD by GOLD classification ([place]) hx frequent exacerbations--   pulmologist-  dr Maryann Alar--  per lov note , dated 12-20-2017, oxyogen 2L is prescribed for use with exertion (but pt only uses mostly at night), O2 sats on RA run the  80s , this day sat 86% RA and with 2L O2 sat 92%  . Varicose veins of leg with swelling    varicose vein surgery - Dr. Aleda Grana  . Wears glasses   . Wears hearing aid in both ears     Past Surgical History:  Procedure Laterality Date  . BREAST EXCISIONAL BIOPSY Left 10/15/2008  . BREAST LUMPECTOMY W/ NEEDLE LOCALIZATION Right 11-09-2008    dr Brantley Stage   [facility]  . CARDIAC CATHETERIZATION  03-12-2011   dr Aundra Dubin   70-80% ostial stenosis in small second diagonal (appears to small for intervention),  dLAD 40-50%,  minimal luminal irregulartieis involoving LCFx and RCA,  LVEF 55%  . CATARACT EXTRACTION W/ INTRAOCULAR LENS  IMPLANT, BILATERAL  2011   . CYSTOSCOPY N/A 03/11/2018   Procedure: CYSTOSCOPY WITH INSTILLATION OF POST OPERATIVE EPIRUBICIN;  Surgeon: Festus Aloe, MD;  [facility];  Service: Urology;  Laterality: N/A;  . MASTECTOMY Right   . PARTIAL COLECTOMY  2002   sigmoid and appectomy (diverticulitis)  . PARTIAL MASTECTOMY WITH NEEDLE LOCALIZATION Left 02/23/2013   Procedure:  LEFT PARTIAL MASTECTOMY WITH NEEDLE LOCALIZATION;  Surgeon: Adin Hector, MD;  Location: [place];  Service: General;  Laterality: Left;  . RECONSTRUCTION BREAST W/ LATISSIMUS DORSI FLAP Right 05-30-2009   dr Harlow Mares  [facility]  . REDUCTION MAMMAPLASTY Left   . SIMPLE MASTECTOMY Right 12-28-2008    dr Brantley Stage  [facility]  . TRANSTHORACIC ECHOCARDIOGRAM  07-09-2016  dr Aundra Dubin   ef 55-60%, grade 1 diastoic dysfunction/  mild TR  . TRANSURETHRAL RESECTION OF BLADDER TUMOR N/A 03/11/2018   Procedure: TRANSURETHRAL RESECTION OF BLADDER TUMOR (TURBT) 2-5cm;  Surgeon: Festus Aloe, MD;  [facility];  Service: Urology;  Laterality: N/A;  . VAGINAL HYSTERECTOMY  1973  . VIDEO ASSISTED THORACOSCOPY (VATS)/WEDGE RESECTION Right 09-11-2005  &  06-14-2011   dr Fara Boros  [facility]   both RUL    Family History  Problem Relation Age of Onset  . Colon cancer Mother        colon  . COPD Mother        brown lung  . Hypertension Mother   . Diabetes Mother   . Emphysema Mother   . Coronary artery disease Father   . Heart attack Father   . Sudden death Father   . Heart disease Father   . Cancer Sister        throat  . Hypertension Sister   . Coronary artery disease Brother   . Heart attack Brother        early 23s  . Throat cancer Sister   . Ovarian cancer Sister        fallopian tube cancer in her 74s  . Melanoma Sister   . Hypertension Sister   . Pancreatic cancer Sister   . Melanoma Niece        dx in her 1s  . Breast cancer Niece 3     Current Outpatient Medications:  .  aspirin 81 MG tablet, Take 81 mg by mouth every  evening. , Disp: , Rfl:  .  atorvastatin (LIPITOR) 80 MG tablet, TAKE 1 TABLET BY MOUTH EVERY DAY (Patient taking differently: Take 80 mg by mouth at bedtime. ), Disp: 90 tablet, Rfl: 3 .  Biotin 1000 MCG tablet, Take 1,000 mcg by mouth daily., Disp: , Rfl:  .  cilostazol (PLETAL) 100 MG tablet, Take 1 tablet (100 mg total) by mouth 2 (two) times daily., Disp:  60 tablet, Rfl: 11 .  docusate sodium (COLACE) 100 MG capsule, Take 100 mg by mouth at bedtime., Disp: , Rfl:  .  fluticasone (FLONASE) 50 MCG/ACT nasal spray, PLACE 1 SPRAY INTO BOTH NOSTRILS 2 (TWO) TIMES DAILY., Disp: 16 mL, Rfl: 1 .  Fluticasone-Umeclidin-Vilant (TRELEGY ELLIPTA) 100-62.5-25 MCG/INH AEPB, Inhale 1 puff into the lungs daily., Disp: 60 each, Rfl: 3 .  ipratropium-albuterol (DUONEB) 0.5-2.5 (3) MG/3ML SOLN, USE 1 NEB 3 TIMES DAILY FOR 3 DAYS, THEN EVERY 6 HOURS AS NEEDED. (Patient taking differently: Inhale 3 mLs into the lungs every 6 (six) hours as needed (sob, wheezing). ), Disp: 360 mL, Rfl: 0 .  levothyroxine (SYNTHROID) 112 MCG tablet, TAKE 1 TABLET (112 MCG TOTAL) BY MOUTH DAILY BEFORE BREAKFAST., Disp: 90 tablet, Rfl: 1 .  LORazepam (ATIVAN) 2 MG tablet, TAKE 1 TABLET BY MOUTH AT BEDTIME AS NEEDED FOR ANXIETY, Disp: 30 tablet, Rfl: 3 .  omeprazole (PRILOSEC) 40 MG capsule, TAKE 1 CAPSULE BY MOUTH TWICE A DAY, Disp: 180 capsule, Rfl: 1 .  traZODone (DESYREL) 100 MG tablet, Take 1 tablet (100 mg total) by mouth at bedtime., Disp: 90 tablet, Rfl: 1 .  varenicline (CHANTIX CONTINUING MONTH PAK) 1 MG tablet, Take 1 tablet (1 mg total) by mouth 2 (two) times daily., Disp: 60 tablet, Rfl: 1 .  varenicline (CHANTIX STARTING MONTH PAK) 0.5 MG X 11 & 1 MG X 42 tablet, Take one 0.5 mg tablet by mouth once daily for 3 days, then increase to one 0.5 mg tablet twice daily for 4 days, then increase to one 1 mg tablet twice daily., Disp: 53 tablet, Rfl: 0 .  azithromycin (ZITHROMAX Z-PAK) 250 MG tablet, As directed, Disp: 6 each, Rfl:  0 .  methylPREDNISolone (MEDROL DOSEPAK) 4 MG TBPK tablet, As directed, Disp: 21 tablet, Rfl: 0  EXAM:  VITALS per patient if applicable:  GENERAL: alert, oriented, appears well and in no acute distress  HEENT: atraumatic, conjunttiva clear, no obvious abnormalities on inspection of external nose and ears  NECK: normal movements of the head and neck  LUNGS: on inspection no signs of respiratory distress, breathing rate appears normal, no obvious gross SOB, gasping or wheezing  CV: no obvious cyanosis  MS: moves all visible extremities without noticeable abnormality  PSYCH/NEURO: pleasant and cooperative, no obvious depression or anxiety, speech and thought processing grossly intact  ASSESSMENT AND PLAN: Bronchitis, treat with a Zpack and a Medrol dose pack.  Christy Penna, MD  Discussed the following assessment and plan:  No diagnosis found.     I discussed the assessment and treatment plan with the patient. The patient was provided an opportunity to ask questions and all were answered. The patient agreed with the plan and demonstrated an understanding of the instructions.   The patient was advised to call back or seek an in-person evaluation if the symptoms worsen or if the condition fails to improve as anticipated.

## 2019-12-30 ENCOUNTER — Telehealth (INDEPENDENT_AMBULATORY_CARE_PROVIDER_SITE_OTHER): Payer: Medicare Other | Admitting: Family Medicine

## 2019-12-30 ENCOUNTER — Other Ambulatory Visit: Payer: Self-pay

## 2019-12-30 ENCOUNTER — Telehealth: Payer: Self-pay | Admitting: Family Medicine

## 2019-12-30 ENCOUNTER — Ambulatory Visit (INDEPENDENT_AMBULATORY_CARE_PROVIDER_SITE_OTHER): Payer: Medicare Other | Admitting: Family Medicine

## 2019-12-30 ENCOUNTER — Ambulatory Visit (INDEPENDENT_AMBULATORY_CARE_PROVIDER_SITE_OTHER): Payer: Medicare Other

## 2019-12-30 VITALS — BP 110/64 | HR 89 | Temp 98.0°F | Ht 65.0 in | Wt 142.8 lb

## 2019-12-30 DIAGNOSIS — R05 Cough: Secondary | ICD-10-CM

## 2019-12-30 DIAGNOSIS — J988 Other specified respiratory disorders: Secondary | ICD-10-CM | POA: Diagnosis not present

## 2019-12-30 DIAGNOSIS — J441 Chronic obstructive pulmonary disease with (acute) exacerbation: Secondary | ICD-10-CM | POA: Diagnosis not present

## 2019-12-30 DIAGNOSIS — R17 Unspecified jaundice: Secondary | ICD-10-CM

## 2019-12-30 DIAGNOSIS — R0602 Shortness of breath: Secondary | ICD-10-CM

## 2019-12-30 DIAGNOSIS — Z1152 Encounter for screening for COVID-19: Secondary | ICD-10-CM

## 2019-12-30 DIAGNOSIS — J9611 Chronic respiratory failure with hypoxia: Secondary | ICD-10-CM

## 2019-12-30 DIAGNOSIS — Z20822 Contact with and (suspected) exposure to covid-19: Secondary | ICD-10-CM | POA: Diagnosis not present

## 2019-12-30 DIAGNOSIS — R059 Cough, unspecified: Secondary | ICD-10-CM

## 2019-12-30 DIAGNOSIS — J189 Pneumonia, unspecified organism: Secondary | ICD-10-CM

## 2019-12-30 MED ORDER — DOXYCYCLINE HYCLATE 100 MG PO CAPS: 100.0000 mg | ORAL_CAPSULE | Freq: Two times a day (BID) | ORAL | 0 refills | Status: DC

## 2019-12-30 MED ORDER — METHYLPREDNISOLONE ACETATE 40 MG/ML IJ SUSP
20.0000 mg | Freq: Once | INTRAMUSCULAR | Status: AC
  Administered 2019-12-30: 20 mg via INTRAMUSCULAR

## 2019-12-30 MED ORDER — GUAIFENESIN-CODEINE 100-10 MG/5ML PO SYRP: 5.0000 mL | ORAL_SOLUTION | Freq: Three times a day (TID) | ORAL | 0 refills | Status: DC | PRN

## 2019-12-30 NOTE — Patient Instructions (Addendum)
I have prescribed guaifenesin with codeine for cough.  Your chest x-ray today was significant for left lobe pneumonia.  I am prescribing the doxycycline in addition to the azithromycin that you are already taking.   Take both medications as prescribed.  Please return here to the respiratory clinic for follow-up on 01/06/20 at 5:15 pm.  If your oxygen level is persistently less than 90% even with 2 L of oxygen or you develop increased rapid breathing go immediately to the ER for further evaluation as these are signs of lack of oxygen and can lead to respiratory distress.      Community-Acquired Pneumonia, Adult Pneumonia is an infection of the lungs. It causes swelling in the airways of the lungs. Mucus and fluid may also build up inside the airways. One type of pneumonia can happen while a person is in a hospital. A different type can happen when a person is not in a hospital (community-acquired pneumonia).  What are the causes?  This condition is caused by germs (viruses, bacteria, or fungi). Some types of germs can be passed from one person to another. This can happen when you breathe in droplets from the cough or sneeze of an infected person. What increases the risk? You are more likely to develop this condition if you:  Have a long-term (chronic) disease, such as: ? Chronic obstructive pulmonary disease (COPD). ? Asthma. ? Cystic fibrosis. ? Congestive heart failure. ? Diabetes. ? Kidney disease.  Have HIV.  Have sickle cell disease.  Have had your spleen removed.  Do not take good care of your teeth and mouth (poor dental hygiene).  Have a medical condition that increases the risk of breathing in droplets from your own mouth and nose.  Have a weakened body defense system (immune system).  Are a smoker.  Travel to areas where the germs that cause this illness are common.  Are around certain animals or the places they live. What are the signs or symptoms?  A dry  cough.  A wet (productive) cough.  Fever.  Sweating.  Chest pain. This often happens when breathing deeply or coughing.  Fast breathing or trouble breathing.  Shortness of breath.  Shaking chills.  Feeling tired (fatigue).  Muscle aches. How is this treated? Treatment for this condition depends on many things. Most adults can be treated at home. In some cases, treatment must happen in a hospital. Treatment may include:  Medicines given by mouth or through an IV tube.  Being given extra oxygen.  Respiratory therapy. In rare cases, treatment for very bad pneumonia may include:  Using a machine to help you breathe.  Having a procedure to remove fluid from around your lungs. Follow these instructions at home: Medicines  Take over-the-counter and prescription medicines only as told by your doctor. ? Only take cough medicine if you are losing sleep.  If you were prescribed an antibiotic medicine, take it as told by your doctor. Do not stop taking the antibiotic even if you start to feel better. General instructions   Sleep with your head and neck raised (elevated). You can do this by sleeping in a recliner or by putting a few pillows under your head.  Rest as needed. Get at least 8 hours of sleep each night.  Drink enough water to keep your pee (urine) pale yellow.  Eat a healthy diet that includes plenty of vegetables, fruits, whole grains, low-fat dairy products, and lean protein.  Do not use any products that contain nicotine  or tobacco. These include cigarettes, e-cigarettes, and chewing tobacco. If you need help quitting, ask your doctor.  Keep all follow-up visits as told by your doctor. This is important. How is this prevented? A shot (vaccine) can help prevent pneumonia. Shots are often suggested for:  People older than 75 years of age.  People older than 75 years of age who: ? Are having cancer treatment. ? Have long-term (chronic) lung disease. ? Have  problems with their body's defense system. You may also prevent pneumonia if you take these actions:  Get the flu (influenza) shot every year.  Go to the dentist as often as told.  Wash your hands often. If you cannot use soap and water, use hand sanitizer. Contact a doctor if:  You have a fever.  You lose sleep because your cough medicine does not help. Get help right away if:  You are short of breath and it gets worse.  You have more chest pain.  Your sickness gets worse. This is very serious if: ? You are an older adult. ? Your body's defense system is weak.  You cough up blood. Summary  Pneumonia is an infection of the lungs.  Most adults can be treated at home. Some will need treatment in a hospital.  Drink enough water to keep your pee pale yellow.  Get at least 8 hours of sleep each night. This information is not intended to replace advice given to you by your health care provider. Make sure you discuss any questions you have with your health care provider. Document Revised: 01/21/2019 Document Reviewed: 05/29/2018 Elsevier Patient Education  [place].

## 2019-12-30 NOTE — Telephone Encounter (Signed)
Pt is scheduled virtually at 4pm today

## 2019-12-30 NOTE — Telephone Encounter (Signed)
Message Routed to PCP CMA 

## 2019-12-30 NOTE — Progress Notes (Signed)
Patient ID: [name], female    DOB: 20-Oct-1945, 74 y.o.   MRN: [id number]  PCP: Martinique, [name], MD  No chief complaint on file.   Subjective:  HPI [name] is a 74 y.o. female presents to [facility] for evaluation of shortness of breath, fatigue, persistent cough, and low oxygen readings. Patient has had no known exposure to COVID-19.  Onset of symptoms x 1 week.History of COPD with hypoxia and she is oxygen dependent with 2 liters via nasal cannula. She normally uses oxygen at nighttime only. During current course of illness she has required use of oxygen during the day for the last 3 days and all day yesterday. She has began to utilize oxygen during the day with course of illness.  She has been monitoring her oxygen levels and obtaining readings between 90-93%. PCP has prescribed a Z-Pak along with Medrol Dosepak.  Patient reports she does need something for cough as that is her most worrisome symptom at present.  Her cough is not productive of ever cyclic and persistent.  She has also using her trilogy inhaler as prescribed.  Review of Systems Pertinent negatives listed in HPI  Patient Active Problem List   Diagnosis Date Noted  . Genetic testing 12/12/2018  . Family history of breast cancer   . Family history of ovarian cancer   . Family history of pancreatic cancer   . Family history of colon cancer   . Family history of melanoma   . History of breast cancer   . Chronic respiratory failure with hypoxia ([place]) 10/14/2017  . COPD with acute exacerbation ([place]) 10/10/2017  . Flu vaccine need 09/09/2017  . Anxiety disorder 09/09/2017  . Fibromyalgia 09/09/2017  . Thrombocytosis ([place]) 06/21/2017  . Malignant neoplasm of overlapping sites of right breast in female, estrogen receptor positive ([place]) 06/05/2017  . Edema of right lower extremity 02/26/2017  . Positive ANA (antinuclear antibody) 02/08/2017  . Elevated IgE level and eosinophilia 12/31/2016  . Oral  thrush 11/28/2016  . Rash and nonspecific skin eruption 11/28/2016  . Allergic rhinitis 11/28/2016  . History of bacterial pneumonia 08/09/2016  . Routine general medical examination at a health care facility 08/12/2015  . Insomnia 03/04/2015  . Breast cancer, right breast ([place]) 02/17/2015  . CAD (coronary artery disease) 07/11/2011  . Tobacco abuse 03/27/2011  . Chronic mesenteric ischemia ([place]) 03/27/2011  . Solitary pulmonary nodule 03/27/2011  . PAD (peripheral artery disease) ([place]) 03/04/2011  . Arthropathy 11/09/2010  . Hypothyroidism 09/20/2009  . Hyperlipidemia 09/20/2009  . Hearing loss 09/14/2008  . Essential hypertension 09/08/2007  . COPD, frequent exacerbations ([place]) 09/08/2007  . GERD 09/08/2007      Prior to Admission medications   Medication Sig Start Date End Date Taking? Authorizing Provider  aspirin 81 MG tablet Take 81 mg by mouth every evening.     [provider]  atorvastatin (LIPITOR) 80 MG tablet TAKE 1 TABLET BY MOUTH EVERY DAY Patient taking differently: Take 80 mg by mouth at bedtime.  04/10/19   [name], NP  azithromycin (ZITHROMAX Z-PAK) 250 MG tablet As directed 12/28/19   Laurey Morale, MD  Biotin 1000 MCG tablet Take 1,000 mcg by mouth daily.    [provider]  cilostazol (PLETAL) 100 MG tablet Take 1 tablet (100 mg total) by mouth 2 (two) times daily. 11/23/19   [name], NP  docusate sodium (COLACE) 100 MG capsule Take 100 mg by mouth at bedtime.  [provider]  fluticasone (FLONASE) 50 MCG/ACT nasal spray PLACE 1 SPRAY INTO BOTH NOSTRILS 2 (TWO) TIMES DAILY. 08/21/19   Martinique, [name], MD  Fluticasone-Umeclidin-Vilant (TRELEGY ELLIPTA) 100-62.5-25 MCG/INH AEPB Inhale 1 puff into the lungs daily. 11/23/19   Martyn Ehrich, NP  ipratropium-albuterol (DUONEB) 0.5-2.5 (3) MG/3ML SOLN USE 1 NEB 3 TIMES DAILY FOR 3 DAYS, THEN EVERY 6 HOURS AS NEEDED. Patient taking differently: Inhale 3 mLs into the lungs  every 6 (six) hours as needed (sob, wheezing).  01/26/19   Brand Males, MD  levothyroxine (SYNTHROID) 112 MCG tablet TAKE 1 TABLET (112 MCG TOTAL) BY MOUTH DAILY BEFORE BREAKFAST. 12/21/19   Martinique, [name], MD  LORazepam (ATIVAN) 2 MG tablet TAKE 1 TABLET BY MOUTH AT BEDTIME AS NEEDED FOR ANXIETY 10/02/19   Martinique, [name], MD  methylPREDNISolone (MEDROL DOSEPAK) 4 MG TBPK tablet As directed 12/28/19   Laurey Morale, MD  omeprazole (PRILOSEC) 40 MG capsule TAKE 1 CAPSULE BY MOUTH TWICE A DAY 12/21/19   Martinique, [name], MD  traZODone (DESYREL) 100 MG tablet Take 1 tablet (100 mg total) by mouth at bedtime. 09/21/19   Martinique, [name], MD  varenicline (CHANTIX CONTINUING MONTH PAK) 1 MG tablet Take 1 tablet (1 mg total) by mouth 2 (two) times daily. 12/09/19   [name], NP  varenicline (CHANTIX STARTING MONTH PAK) 0.5 MG X 11 & 1 MG X 42 tablet Take one 0.5 mg tablet by mouth once daily for 3 days, then increase to one 0.5 mg tablet twice daily for 4 days, then increase to one 1 mg tablet twice daily. 10/28/19   [name], NP    Past Medical, Surgical Family and Social History reviewed and updated.    Objective:   Today's Vitals   12/30/19 1742  BP: 110/64  Pulse: 89  Temp: 98 F (36.7 C)  SpO2: 91%  Weight: 142 lb 12.8 oz (64.8 kg)  Height: 5\' 5"  (1.651 m)    Wt Readings from Last 3 Encounters:  12/28/19 140 lb (63.5 kg)  12/04/19 138 lb (62.6 kg)  11/24/19 139 lb (63 kg)     Physical Exam Constitutional:      Appearance: She is obese.  HENT:     Head: Normocephalic and atraumatic.     Right Ear: Tympanic membrane normal.     Left Ear: Tympanic membrane normal.     Nose: Congestion present.  Eyes:     General: Scleral icterus present.  Cardiovascular:     Rate and Rhythm: Normal rate and regular rhythm.  Pulmonary:     Breath sounds: Wheezing and rhonchi present.  Chest:     Chest wall: Tenderness present.  Abdominal:     General: Bowel sounds are normal.      Palpations: Abdomen is soft.  Musculoskeletal:        General: Normal range of motion.     Cervical back: Normal range of motion and neck supple.  Skin:    General: Skin is dry.     Capillary Refill: Capillary refill takes 2 to 3 seconds.     Coloration: Skin is pale.  Neurological:     Mental Status: She is alert and oriented to person, place, and time.     Gait: Gait normal.  Psychiatric:        Mood and Affect: Mood normal.        Behavior: Behavior normal.       Assessment &  Plan:  1. Encounter for screening laboratory testing for COVID-19 virus -COVID-19 test is pending  2. Cough -Guaifenesin with codeine take 5 mL by mouth 3 times per day as needed.  Avoid driving while taking medication due to drowsiness and sedated effects. - DG Chest Portable 2 Views; Future  3. Scleral icterus -CMP ordered, however, unable to obtain a blood for specimen with multiple attempts. -Patient is followed by Oncology and has a PCP and will have labs performed at next visit   5. Shortness of breath, secondary to COPD - methylPREDNISolone acetate (DEPO-MEDROL) injection 20 mg   6. COPD with acute exacerbation ([place]) -Continue trilogy and follow-up with pulmonology as needed. -Continue home oxygen and can continue use during the day as needed -Call your PCP immediately if oxygen demand increases greater than 3 liters -Go to the ER if your oxygen levels drops below 88% with use of oxygen  7. Pneumonia of left lower lobe due to infectious organism -Continue azithromycin will add doxycycline 100 mg twice daily for 10 days. DG Chest Portable 2 Views Result Date: 12/30/2019 CLINICAL DATA:  Wheezing. EXAM: CHEST  2 VIEW PORTABLE COMPARISON:  07/13/2019 FINDINGS: There is stable scarring in the right upper lobe. There is suggestion of a new airspace opacity overlying the left lower lung zone. The heart size is stable from prior study. Aortic calcifications are noted. There is some  pleuroparenchymal scarring at the lung apices bilaterally. Emphysema is again noted. IMPRESSION: 1. Subtle new airspace opacity at the left lung base may represent a developing infiltrate. A 4-6 week follow-up two-view chest x-ray is recommended to confirm resolution of this finding. 2. Stable scarring in the right upper lobe. 3. Aortic Atherosclerosis (ICD10-I70.0) and Emphysema (ICD10-J43.9). Electronically Signed   By: Constance Holster M.D.   On: 12/30/2019 18:16   Meds ordered this encounter  Medications  . guaiFENesin-codeine (ROBITUSSIN AC) 100-10 MG/5ML syrup    Sig: Take 5 mLs by mouth 3 (three) times daily as needed for cough.    Dispense:  120 mL    Refill:  0  . methylPREDNISolone acetate (DEPO-MEDROL) injection 20 mg  . doxycycline (VIBRAMYCIN) 100 MG capsule    Sig: Take 1 capsule (100 mg total) by mouth 2 (two) times daily.    Dispense:  20 capsule    Refill:  0     -The patient was given clear instructions to go to ER or return to medical center if symptoms do not improve, worsen or new problems develop. The patient verbalized understanding.     Molli Barrows, FNP-C [facility], PRN Provider  [facility]. [place], [facility] Phone: [phone] Clinic Fax: [phone] Clinic Hours: 5:30 pm -7:30 pm (Monday-Friday)

## 2019-12-30 NOTE — Telephone Encounter (Signed)
Can you set her up with a visit with Dr. Martinique for this afternoon?

## 2019-12-30 NOTE — Progress Notes (Signed)
Virtual Visit via Video Note   I connected with [name] on 12/30/19 by a video enabled telemedicine application and verified that I am speaking with the correct person using two identifiers.  Location patient: home Location provider:work office Persons participating in the virtual visit: patient, provider  I discussed the limitations of evaluation and management by telemedicine and the availability of in person appointments. The patient expressed understanding and agreed to proceed.   HPI: [name] is a 74 yo female with hx of HTN,CAD,allergies,fibromyalgia, PAD,hearing loss, breast cancer,and COPD among some. Chronic respiratory failure on continues supplemental; O2 2 LPM. Interrogation is difficult due to hearing loss.  According to her son she has been sick since 12/26/19. She started with worsening fatigue,frontal headache, chest congestion,productive cough with clear sputum , dyspnea, wheezing, and body aches. Copious "thick" rhinorrhea.  Dyspnea, wheezing, and cough are exacerbated by exertion and alleviated by rest + breathing treatments.  O2 supplementation requirements have not changed. No associated hemoptysis, CP, or diaphoresis.  Initially she had sore throat but it has resolved.  She has not noted fever, chills,or changes in smell/taste. She had a virtual visit on 12/28/2019. Dx'ed with chronic bronchitis.  She is currently on azithromycin and methylprednisone.  She is also using her COPD medications: DuoNeb 3-4 times per day and Trelegy Ellipta. She follows with pulmonologist. Allergy rhinitis, she is using Flonase nasal spray. She is also taking plain Mucinex.  She reporting symptoms as stable.  Husband was sick with similar symptoms. She has not had COVID 19 screening.  Negative for known contact COVID 19 infected person.  She has also had diarrhea but this is not related with current respiratory symptoms, stated that she has had diarrhea for about 6 to 7  months. Negative for abdominal pain, N/V, urinary symptoms, or a skin rash.  ROS: See pertinent positives and negatives per HPI.  Past Medical History:  Diagnosis Date  . Bilateral leg cramps   . Bladder neoplasm   . Chronic deep vein thrombosis (DVT) of right lower extremity ([place])    05/ 2018  chronic non-occlusive DVT RLE  . COPD, frequent exacerbations ([place])    03-06-2018 per pt last exacerbation 12/ 2018  . Coronary artery disease    cardiologist-  dr Aundra Dubin-- 03-11-2018 er cath , 80% stenosis in the ostial second diagonal  . Dyspnea on minimal exertion   . Emphysema/COPD ([place])    CAT score- 17  . Family history of breast cancer   . Family history of colon cancer   . Family history of melanoma   . Family history of ovarian cancer   . Family history of pancreatic cancer   . Fibromyalgia 1995  . GERD (gastroesophageal reflux disease)   . Hiatal hernia   . History of breast cancer   . History of diverticulitis of colon    2002  s/p  sigmoid colectomy  . History of multiple pulmonary nodules    hx RUL nodules x2  s/p right VATS w/ wedge resection 09-11-2005 and 06-14-2011  both  necrotizing granulomatous inflammation w/ cystic area of necrosis & focal calcification  . History of right breast cancer oncologist-  dr Jana Hakim-- no recurrence   dx 2010--  IDC, Stage IA , ER/PR+,  (pT1c pN0) 11-09-2008 right lumpectomy;  12-18-2008 right simple mastectomy for DCIS margins;  completed chemotherapy 2010 (no radiation) and completed antiestrogen therapy  . Hx of colonic polyps    Dr. Cristina Gong -last study '11  . Hyperlipidemia   .  Hypertension   . Hypothyroidism   . 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 Nocturia   . OA (osteoarthritis)    thumbs  . On supplemental oxygen therapy    03-06-2018 per pt uses only at night,  checks O2 sats at home,  stated am sat 89% after moving around average 93-94% with RA  . Osteoporosis   . Peripheral arterial  occlusive disease [facility]) vascular-- dr chen/ dr Donzetta Matters   proximal right SFA severe focal stenosis with collaterals from the PFA/  04/ 2012 occluded celiac and SMA arteries with distal reconstitution w/ patent IMA  . Peripheral vascular disease ([place])    chronic DVT RLE,  mesenteric vascular disease  . Right lower lobe pulmonary nodule    Chest CT 08-29-2017  . Stage 3 severe COPD by GOLD classification ([place]) hx frequent exacerbations--   pulmologist-  dr Maryann Alar--  per lov note , dated 12-20-2017, oxyogen 2L is prescribed for use with exertion (but pt only uses mostly at night), O2 sats on RA run the 80s , this day sat 86% RA and with 2L O2 sat 92%  . Varicose veins of leg with swelling    varicose vein surgery - Dr. Aleda Grana  . Wears glasses   . Wears hearing aid in both ears     Past Surgical History:  Procedure Laterality Date  . BREAST EXCISIONAL BIOPSY Left 10/15/2008  . BREAST LUMPECTOMY W/ NEEDLE LOCALIZATION Right 11-09-2008    dr Brantley Stage   [facility]  . CARDIAC CATHETERIZATION  03-12-2011   dr Aundra Dubin   70-80% ostial stenosis in small second diagonal (appears to small for intervention),  dLAD 40-50%,  minimal luminal irregulartieis involoving LCFx and RCA,  LVEF 55%  . CATARACT EXTRACTION W/ INTRAOCULAR LENS  IMPLANT, BILATERAL  2011  . CYSTOSCOPY N/A 03/11/2018   Procedure: CYSTOSCOPY WITH INSTILLATION OF POST OPERATIVE EPIRUBICIN;  Surgeon: Festus Aloe, MD;  Location: [facility];  Service: Urology;  Laterality: N/A;  . MASTECTOMY Right   . PARTIAL COLECTOMY  2002   sigmoid and appectomy (diverticulitis)  . PARTIAL MASTECTOMY WITH NEEDLE LOCALIZATION Left 02/23/2013   Procedure:  LEFT PARTIAL MASTECTOMY WITH NEEDLE LOCALIZATION;  Surgeon: Adin Hector, MD;  Location: [place];  Service: General;  Laterality: Left;  . RECONSTRUCTION BREAST W/ LATISSIMUS DORSI FLAP Right 05-30-2009   dr Harlow Mares  [facility]  . REDUCTION MAMMAPLASTY Left   . SIMPLE MASTECTOMY Right  12-28-2008    dr Brantley Stage  [facility]  . TRANSTHORACIC ECHOCARDIOGRAM  07-09-2016  dr Aundra Dubin   ef 55-60%, grade 1 diastoic dysfunction/  mild TR  . TRANSURETHRAL RESECTION OF BLADDER TUMOR N/A 03/11/2018   Procedure: TRANSURETHRAL RESECTION OF BLADDER TUMOR (TURBT) 2-5cm;  Surgeon: Festus Aloe, MD;  Location: [facility];  Service: Urology;  Laterality: N/A;  . VAGINAL HYSTERECTOMY  1973  . VIDEO ASSISTED THORACOSCOPY (VATS)/WEDGE RESECTION Right 09-11-2005  &  06-14-2011   dr Fara Boros  [facility]   both RUL    Family History  Problem Relation Age of Onset  . Colon cancer Mother        colon  . COPD Mother        brown lung  . Hypertension Mother   . Diabetes Mother   . Emphysema Mother   . Coronary artery disease Father   . Heart attack Father   . Sudden death Father   . Heart disease Father   . Cancer Sister  throat  . Hypertension Sister   . Coronary artery disease Brother   . Heart attack Brother        early 22s  . Throat cancer Sister   . Ovarian cancer Sister        fallopian tube cancer in her 38s  . Melanoma Sister   . Hypertension Sister   . Pancreatic cancer Sister   . Melanoma Niece        dx in her 56s  . Breast cancer Niece 51    Social History   Socioeconomic History  . Marital status: Married    Spouse name: Not on file  . Number of children: 3  . Years of education: 62  . Highest education level: Not on file  Occupational History  . Occupation: Chiropractor: RETIRED    Comment: retired  Tobacco Use  . Smoking status: Former Smoker    Packs/day: 0.75    Years: 50.00    Pack years: 37.50    Types: Cigarettes    Quit date: 05/18/2016    Years since quitting: 3.6  . Smokeless tobacco: Never Used  Substance and Sexual Activity  . Alcohol use: Yes    Alcohol/week: 10.0 standard drinks    Types: 10 Cans of beer per week  . Drug use: No  . Sexual activity: Not on file  Other Topics Concern  . Not on file  Social History Narrative    HSG, beauty school. Married '69 -44 yrs/widowed; married '79 - 71yr/divorced; married '87.  2 sons - '70, '74, 1 srtep-son; 1 grandchild. 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 SO - good health.  NO history of abuse.  ACP - full code and all heroic measures.    Social Determinants of Health   Financial Resource Strain:   . Difficulty of Paying Living Expenses:   Food Insecurity:   . Worried About Charity fundraiser in the Last Year:   . Arboriculturist in the Last Year:   Transportation Needs:   . Film/video editor (Medical):   Marland Kitchen Lack of Transportation (Non-Medical):   Physical Activity:   . Days of Exercise per Week:   . Minutes of Exercise per Session:   Stress:   . Feeling of Stress :   Social Connections:   . Frequency of Communication with Friends and Family:   . Frequency of Social Gatherings with Friends and Family:   . Attends Religious Services:   . Active Member of Clubs or Organizations:   . Attends Archivist Meetings:   Marland Kitchen Marital Status:   Intimate Partner Violence:   . Fear of Current or Ex-Partner:   . Emotionally Abused:   Marland Kitchen Physically Abused:   . Sexually Abused:     Current Outpatient Medications:  .  aspirin 81 MG tablet, Take 81 mg by mouth every evening. , Disp: , Rfl:  .  atorvastatin (LIPITOR) 80 MG tablet, TAKE 1 TABLET BY MOUTH EVERY DAY (Patient taking differently: Take 80 mg by mouth at bedtime. ), Disp: 90 tablet, Rfl: 3 .  azithromycin (ZITHROMAX Z-PAK) 250 MG tablet, As directed, Disp: 6 each, Rfl: 0 .  Biotin 1000 MCG tablet, Take 1,000 mcg by mouth daily., Disp: , Rfl:  .  cilostazol (PLETAL) 100 MG tablet, Take 1 tablet (100 mg total) by mouth 2 (two) times daily., Disp: 60 tablet, Rfl: 11 .  docusate sodium (COLACE) 100 MG capsule, Take 100 mg by mouth at  bedtime., Disp: , Rfl:  .  fluticasone (FLONASE) 50 MCG/ACT nasal spray, PLACE 1 SPRAY INTO BOTH NOSTRILS 2 (TWO) TIMES DAILY., Disp: 16 mL, Rfl: 1 .  Fluticasone-Umeclidin-Vilant  (TRELEGY ELLIPTA) 100-62.5-25 MCG/INH AEPB, Inhale 1 puff into the lungs daily., Disp: 60 each, Rfl: 3 .  ipratropium-albuterol (DUONEB) 0.5-2.5 (3) MG/3ML SOLN, USE 1 NEB 3 TIMES DAILY FOR 3 DAYS, THEN EVERY 6 HOURS AS NEEDED. (Patient taking differently: Inhale 3 mLs into the lungs every 6 (six) hours as needed (sob, wheezing). ), Disp: 360 mL, Rfl: 0 .  levothyroxine (SYNTHROID) 112 MCG tablet, TAKE 1 TABLET (112 MCG TOTAL) BY MOUTH DAILY BEFORE BREAKFAST., Disp: 90 tablet, Rfl: 1 .  LORazepam (ATIVAN) 2 MG tablet, TAKE 1 TABLET BY MOUTH AT BEDTIME AS NEEDED FOR ANXIETY, Disp: 30 tablet, Rfl: 3 .  methylPREDNISolone (MEDROL DOSEPAK) 4 MG TBPK tablet, As directed, Disp: 21 tablet, Rfl: 0 .  omeprazole (PRILOSEC) 40 MG capsule, TAKE 1 CAPSULE BY MOUTH TWICE A DAY, Disp: 180 capsule, Rfl: 1 .  traZODone (DESYREL) 100 MG tablet, Take 1 tablet (100 mg total) by mouth at bedtime., Disp: 90 tablet, Rfl: 1 .  varenicline (CHANTIX CONTINUING MONTH PAK) 1 MG tablet, Take 1 tablet (1 mg total) by mouth 2 (two) times daily., Disp: 60 tablet, Rfl: 1 .  varenicline (CHANTIX STARTING MONTH PAK) 0.5 MG X 11 & 1 MG X 42 tablet, Take one 0.5 mg tablet by mouth once daily for 3 days, then increase to one 0.5 mg tablet twice daily for 4 days, then increase to one 1 mg tablet twice daily., Disp: 53 tablet, Rfl: 0  EXAM:  VITALS per patient if applicable:N/A  GENERAL: alert, oriented, appears well and in no acute distress.   HEENT: atraumatic, conjunttiva clear, no obvious abnormalities on inspection. + Hearing loss. + Nasal voice.  NECK: normal movements of the head and neck  LUNGS: on inspection no signs of respiratory distress, mild dyspnea on supplemental O2 with nasal canula.Able to complete sentence while walking around her house holding her phone.  Negative for cough and I do not appreciate wheezing  CV: no obvious cyanosis  PSYCH/NEURO: pleasant and cooperative, no obvious depression. + Anxious,peech  and thought processing grossly intact  ASSESSMENT AND PLAN:  Discussed the following assessment and plan:  Respiratory tract infection We discussed possible etiologies, including COVID-19 infection. Explained that this is most likely viral,in which case abx does not help.  For now recommend continuing same treatment.  At this time she does not seem to be in acute respiratory distress, so I do not think she needs to go to the ER but I would like for her to be evaluated today.  Appointment with respiratory clinic at [facility] was arranged, heart rate was given and she voices understanding.  COPD with acute exacerbation (HCC) Continue DuoNeb nebs 3-4 times per day, Trelegy Ellipta 100-62.5-25 mg once daily, and methylprednisone. CXR can be done at [facility].  Chronic respiratory failure with hypoxia (HCC) Same O2 supplementation requirement, so it seems to be otherwise stable. Instructed about warning signs. Following with pulmonologist.  I discussed the assessment and treatment plan with the patient. [name] and her son were provided an opportunity to ask questions and all were answered. She agreed with the plan and demonstrated an understanding of the instructions.   Return if symptoms worsen or fail to improve.    [place], MD

## 2019-12-30 NOTE — Telephone Encounter (Signed)
Pt has been taking the antibiotic Sarajane Jews prescribed on Monday March 15 as well as Flonase, inhaler, and mucinex. Pt is still wheezing, headache, and blowing nose a lot. When she woke up this morning she was SOB and took a breathing treatment.  Pt would like advice on what she can do or who she can go to?   Pt can be reached at [phone]   Nicole-Nurse from [facility] wanted to make sure the doctor she saw monday was aware of what is going on and would like that him/CMA follow up with her

## 2020-01-01 ENCOUNTER — Other Ambulatory Visit: Payer: Self-pay | Admitting: Family Medicine

## 2020-01-01 ENCOUNTER — Telehealth: Payer: Self-pay

## 2020-01-01 DIAGNOSIS — K219 Gastro-esophageal reflux disease without esophagitis: Secondary | ICD-10-CM

## 2020-01-01 LAB — NOVEL CORONAVIRUS, NAA: SARS-CoV-2, NAA: NOT DETECTED

## 2020-01-01 LAB — SPECIMEN STATUS REPORT

## 2020-01-01 NOTE — Telephone Encounter (Signed)
Patient has been informed of her negative COVID result.

## 2020-01-04 ENCOUNTER — Telehealth: Payer: Self-pay | Admitting: *Deleted

## 2020-01-04 NOTE — Telephone Encounter (Signed)
Elita Quick, RN called after hours line on behalf of the member who is not feeling well despite being started on antibiotics 5 days ago. Dry cough, not able to cough up phlegm. No fever and is not feeling well. Difficulty breathing. Shortness of breath. Patient was advised to go to ED but refused. Patient was seen at respiratory clinic and tested for COVID which was negative.

## 2020-01-04 NOTE — Telephone Encounter (Signed)
Patient is scheduled for an inperson follow up with the respiratory clinic on 3/24.

## 2020-01-06 ENCOUNTER — Ambulatory Visit (INDEPENDENT_AMBULATORY_CARE_PROVIDER_SITE_OTHER): Payer: Medicare Other

## 2020-01-06 ENCOUNTER — Other Ambulatory Visit: Payer: Self-pay | Admitting: Nurse Practitioner

## 2020-01-06 ENCOUNTER — Ambulatory Visit (INDEPENDENT_AMBULATORY_CARE_PROVIDER_SITE_OTHER): Payer: Medicare Other | Admitting: Family Medicine

## 2020-01-06 VITALS — BP 102/70 | HR 96 | Temp 98.1°F

## 2020-01-06 DIAGNOSIS — R0602 Shortness of breath: Secondary | ICD-10-CM

## 2020-01-06 DIAGNOSIS — J441 Chronic obstructive pulmonary disease with (acute) exacerbation: Secondary | ICD-10-CM

## 2020-01-06 DIAGNOSIS — J189 Pneumonia, unspecified organism: Secondary | ICD-10-CM

## 2020-01-06 DIAGNOSIS — R053 Chronic cough: Secondary | ICD-10-CM

## 2020-01-06 DIAGNOSIS — R05 Cough: Secondary | ICD-10-CM

## 2020-01-06 MED ORDER — PREDNISONE 20 MG PO TABS: 40.0000 mg | ORAL_TABLET | Freq: Every day | ORAL | 0 refills | Status: AC

## 2020-01-06 MED ORDER — BENZONATATE 100 MG PO CAPS: 100.0000 mg | ORAL_CAPSULE | Freq: Three times a day (TID) | ORAL | 0 refills | Status: DC | PRN

## 2020-01-06 MED ORDER — HYDROCODONE-HOMATROPINE 5-1.5 MG/5ML PO SYRP: 5.0000 mL | ORAL_SOLUTION | Freq: Two times a day (BID) | ORAL | 0 refills | Status: DC | PRN

## 2020-01-06 MED ORDER — ALBUTEROL SULFATE HFA 108 (90 BASE) MCG/ACT IN AERS: 2.0000 | INHALATION_SPRAY | RESPIRATORY_TRACT | Status: DC | PRN

## 2020-01-06 MED ORDER — METHYLPREDNISOLONE SODIUM SUCC 125 MG IJ SOLR
125.0000 mg | Freq: Once | INTRAMUSCULAR | Status: AC
  Administered 2020-01-06: 125 mg via INTRAMUSCULAR

## 2020-01-06 NOTE — Patient Instructions (Signed)
Complete Doxycyline. Albuterol 2 puffs every 4-6 hours as needed for shortness or persistent cough. I have added a short round prednisone to help with symptoms. Start tomorrow. For cough discontinue Robitussin AC and start Hycodan for cough at bedtime and Benzonatate for cough during the day.   If your symptoms worsen go immediately to the Emergency Department.    Chronic Obstructive Pulmonary Disease Chronic obstructive pulmonary disease (COPD) is a long-term (chronic) lung problem. When you have COPD, it is hard for air to get in and out of your lungs. Usually the condition gets worse over time, and your lungs will never return to normal. There are things you can do to keep yourself as healthy as possible.  Your doctor may treat your condition with: ? Medicines. ? Oxygen. ? Lung surgery.  Your doctor may also recommend: ? Rehabilitation. This includes steps to make your body work better. It may involve a team of specialists. ? Quitting smoking, if you smoke. ? Exercise and changes to your diet. ? Comfort measures (palliative care). Follow these instructions at home: Medicines  Take over-the-counter and prescription medicines only as told by your doctor.  Talk to your doctor before taking any cough or allergy medicines. You may need to avoid medicines that cause your lungs to be dry. Lifestyle  If you smoke, stop. Smoking makes the problem worse. If you need help quitting, ask your doctor.  Avoid being around things that make your breathing worse. This may include smoke, chemicals, and fumes.  Stay active, but remember to rest as well.  Learn and use tips on how to relax.  Make sure you get enough sleep. Most adults need at least 7 hours of sleep every night.  Eat healthy foods. Eat smaller meals more often. Rest before meals. Controlled breathing Learn and use tips on how to control your breathing as told by your doctor. Try:  Breathing in (inhaling) through your nose  for 1 second. Then, pucker your lips and breath out (exhale) through your lips for 2 seconds.  Putting one hand on your belly (abdomen). Breathe in slowly through your nose for 1 second. Your hand on your belly should move out. Pucker your lips and breathe out slowly through your lips. Your hand on your belly should move in as you breathe out.  Controlled coughing Learn and use controlled coughing to clear mucus from your lungs. Follow these steps: 1. Lean your head a little forward. 2. Breathe in deeply. 3. Try to hold your breath for 3 seconds. 4. Keep your mouth slightly open while coughing 2 times. 5. Spit any mucus out into a tissue. 6. Rest and do the steps again 1 or 2 times as needed. General instructions  Make sure you get all the shots (vaccines) that your doctor recommends. Ask your doctor about a flu shot and a pneumonia shot.  Use oxygen therapy and pulmonary rehabilitation if told by your doctor. If you need home oxygen therapy, ask your doctor if you should buy a tool to measure your oxygen level (oximeter).  Make a COPD action plan with your doctor. This helps you to know what to do if you feel worse than usual.  Manage any other conditions you have as told by your doctor.  Avoid going outside when it is very hot, cold, or humid.  Avoid people who have a sickness you can catch (contagious).  Keep all follow-up visits as told by your doctor. This is important. Contact a doctor if:  You  cough up more mucus than usual.  There is a change in the color or thickness of the mucus.  It is harder to breathe than usual.  Your breathing is faster than usual.  You have trouble sleeping.  You need to use your medicines more often than usual.  You have trouble doing your normal activities such as getting dressed or walking around the house. Get help right away if:  You have shortness of breath while resting.  You have shortness of breath that stops you from: ? Being  able to talk. ? Doing normal activities.  Your chest hurts for longer than 5 minutes.  Your skin color is more blue than usual.  Your pulse oximeter shows that you have low oxygen for longer than 5 minutes.  You have a fever.  You feel too tired to breathe normally. Summary  Chronic obstructive pulmonary disease (COPD) is a long-term lung problem.  The way your lungs work will never return to normal. Usually the condition gets worse over time. There are things you can do to keep yourself as healthy as possible.  Take over-the-counter and prescription medicines only as told by your doctor.  If you smoke, stop. Smoking makes the problem worse. This information is not intended to replace advice given to you by your health care provider. Make sure you discuss any questions you have with your health care provider. Document Revised: 09/13/2017 Document Reviewed: 11/05/2016 Elsevier Patient Education  2020 [employer].

## 2020-01-06 NOTE — Progress Notes (Signed)
Patient ID: [name], female    DOB: 1945-12-19, 74 y.o.   MRN: [id number]  PCP: Martin[place], [name], MD  No chief complaint on file.   Subjective:  HPI [name] is a 74 y.o. female presents to [facility] for evaluation of recently diagnosed pneumonia involving the left lobe. Last seen in clinic 12/30/19 in which she had a negative COVID-19 test. During her visit, PCP already initiated antibiotic treatment with azithromycin and prednisone. Following abnormal CXR, doxycycline was added to provide broader spectrum of coverage for presumed bacterial pneumonia. Patient only started Doxycyline two days ago.Patient suffers from COPD. Complains of continues SOB, cough (dry), wheezing and feeling poorly. Patient is also an active cancer patient and is currently undergoing treatment for right breast cancer. Other pertinent comorbid conditions include cardiovascular disease and arterial vascular disease.  Review of Systems Pertinent negatives listed in HPI  Patient Active Problem List   Diagnosis Date Noted  . Genetic testing 12/12/2018  . Family history of breast cancer   . Family history of ovarian cancer   . Family history of pancreatic cancer   . Family history of colon cancer   . Family history of melanoma   . History of breast cancer   . Chronic respiratory failure with hypoxia ([place]) 10/14/2017  . COPD with acute exacerbation ([place]) 10/10/2017  . Flu vaccine need 09/09/2017  . Anxiety disorder 09/09/2017  . Fibromyalgia 09/09/2017  . Thrombocytosis ([place]) 06/21/2017  . Malignant neoplasm of overlapping sites of right breast in female, estrogen receptor positive ([place]) 06/05/2017  . Edema of right lower extremity 02/26/2017  . Positive ANA (antinuclear antibody) 02/08/2017  . Elevated IgE level and eosinophilia 12/31/2016  . Oral thrush 11/28/2016  . Rash and nonspecific skin eruption 11/28/2016  . Allergic rhinitis 11/28/2016  . History of bacterial pneumonia  08/09/2016  . Routine general medical examination at a health care facility 08/12/2015  . Insomnia 03/04/2015  . Breast cancer, right breast ([place]) 02/17/2015  . CAD (coronary artery disease) 07/11/2011  . Tobacco abuse 03/27/2011  . Chronic mesenteric ischemia ([place]) 03/27/2011  . Solitary pulmonary nodule 03/27/2011  . PAD (peripheral artery disease) ([place]) 03/04/2011  . Arthropathy 11/09/2010  . Hypothyroidism 09/20/2009  . Hyperlipidemia 09/20/2009  . Hearing loss 09/14/2008  . Essential hypertension 09/08/2007  . COPD, frequent exacerbations ([place]) 09/08/2007  . GERD 09/08/2007      Prior to Admission medications   Medication Sig Start Date End Date Taking? Authorizing Provider  aspirin 81 MG tablet Take 81 mg by mouth every evening.     [provider]  atorvastatin (LIPITOR) 80 MG tablet TAKE 1 TABLET BY MOUTH EVERY DAY Patient taking differently: Take 80 mg by mouth at bedtime.  04/10/19   [name], NP  azithromycin (ZITHROMAX Z-PAK) 250 MG tablet As directed 12/28/19   Laurey Morale, MD  Biotin 1000 MCG tablet Take 1,000 mcg by mouth daily.    [provider]  cilostazol (PLETAL) 100 MG tablet Take 1 tablet (100 mg total) by mouth 2 (two) times daily. 11/23/19   [name], NP  docusate sodium (COLACE) 100 MG capsule Take 100 mg by mouth at bedtime.    [provider]  doxycycline (VIBRAMYCIN) 100 MG capsule Take 1 capsule (100 mg total) by mouth 2 (two) times daily. 12/30/19   Scot Jun, FNP  famotidine (PEPCID) 20 MG tablet TAKE 1 TABLET BY MOUTH TWICE A DAY 01/01/20   Martinique, Betty  G, MD  fluticasone (FLONASE) 50 MCG/ACT nasal spray PLACE 1 SPRAY INTO BOTH NOSTRILS 2 (TWO) TIMES DAILY. 08/21/19   Martinique, [name], MD  Fluticasone-Umeclidin-Vilant (TRELEGY ELLIPTA) 100-62.5-25 MCG/INH AEPB Inhale 1 puff into the lungs daily. 11/23/19   Martyn Ehrich, NP  guaiFENesin-codeine [facility]) 100-10 MG/5ML syrup Take 5 mLs by mouth 3  (three) times daily as needed for cough. 12/30/19   Scot Jun, FNP  ipratropium-albuterol (DUONEB) 0.5-2.5 (3) MG/3ML SOLN USE 1 NEB 3 TIMES DAILY FOR 3 DAYS, THEN EVERY 6 HOURS AS NEEDED. Patient taking differently: Inhale 3 mLs into the lungs every 6 (six) hours as needed (sob, wheezing).  01/26/19   Brand Males, MD  levothyroxine (SYNTHROID) 112 MCG tablet TAKE 1 TABLET (112 MCG TOTAL) BY MOUTH DAILY BEFORE BREAKFAST. 12/21/19   Martinique, [name], MD  LORazepam (ATIVAN) 2 MG tablet TAKE 1 TABLET BY MOUTH AT BEDTIME AS NEEDED FOR ANXIETY 10/02/19   [place], [name], MD  methylPREDNISolone (MEDROL DOSEPAK) 4 MG TBPK tablet As directed 12/28/19   Laurey Morale, MD  omeprazole (PRILOSEC) 40 MG capsule TAKE 1 CAPSULE BY MOUTH TWICE A DAY 12/21/19   Martin[place], [name], MD  traZODone (DESYREL) 100 MG tablet Take 1 tablet (100 mg total) by mouth at bedtime. 09/21/19   Martin[place], [name], MD  varenicline (CHANTIX CONTINUING MONTH PAK) 1 MG tablet Take 1 tablet (1 mg total) by mouth 2 (two) times daily. 12/09/19   [name], NP  varenicline (CHANTIX STARTING MONTH PAK) 0.5 MG X 11 & 1 MG X 42 tablet Take one 0.5 mg tablet by mouth once daily for 3 days, then increase to one 0.5 mg tablet twice daily for 4 days, then increase to one 1 mg tablet twice daily. 10/28/19   [name], NP    Past Medical, Surgical Family and Social History reviewed and updated.    Objective:  There were no vitals filed for this visit.  Wt Readings from Last 3 Encounters:  12/30/19 142 lb 12.8 oz (64.8 kg)  12/28/19 140 lb (63.5 kg)  12/04/19 138 lb (62.6 kg)     Physical Exam Constitutional:      Appearance: She is ill-appearing. She is not toxic-appearing.  HENT:     Head: Normocephalic.  Cardiovascular:     Rate and Rhythm: Normal rate and regular rhythm.  Pulmonary:     Breath sounds: Wheezing and rhonchi present.     Comments: Diffuse Rhonchi and Wheezing  Skin:    General: Skin is warm and dry.   Psychiatric:        Attention and Perception: Attention normal.        Mood and Affect: Mood normal.     Assessment & Plan:  1. Pneumonia of left lower lobe due to infectious organism -Chest x-ray stable and reflects chronic lung changes  -Physical exam concerning as patient has diffuse wheezing -Complete Doxycyline -Added Prednisone 40 mg x 5 days -Discontinue Robitussin AC -Start Benzonatate and Hycodan (QHS)   2. COPD with acute exacerbation (HCC) -Continue DuoNebs -Added a rescue inhaler (Albuterol) 2 puffs every  4-6 hours as needed for SOB, wheezing, and cyclic coughing  -Solumedrol 125 mg IM administered in clinic  -Continue home oxygen   3. Shortness of breath See #2  Patient has been strongly advised if work of breathing worsens or if she experienced increase requirements for oxygen, she was instructed to go immediately to the ER.  Meds ordered this encounter  Medications  . albuterol (VENTOLIN HFA) 108 (90 Base) MCG/ACT inhaler 2 puff  . DISCONTD: HYDROcodone-homatropine (HYCODAN) 5-1.5 MG/5ML syrup    Sig: Take 5 mLs by mouth every 12 (twelve) hours as needed for cough.    Dispense:  100 mL    Refill:  0  . benzonatate (TESSALON) 100 MG capsule    Sig: Take 1-2 capsules (100-200 mg total) by mouth 3 (three) times daily as needed for cough.    Dispense:  60 capsule    Refill:  0  . methylPREDNISolone sodium succinate (SOLU-MEDROL) 125 mg/2 mL injection 125 mg  . predniSONE (DELTASONE) 20 MG tablet    Sig: Take 2 tablets (40 mg total) by mouth daily with breakfast for 5 days.    Dispense:  10 tablet    Refill:  0  . HYDROcodone-homatropine (HYCODAN) 5-1.5 MG/5ML syrup    Sig: Take 5 mLs by mouth every 12 (twelve) hours as needed for cough.    Dispense:  100 mL    Refill:  0    RTC: 1 week for follow-up     -The patient was given clear instructions to go to ER or return to medical center if symptoms do not improve, worsen or new problems develop. The  patient verbalized understanding.     Molli Barrows, FNP-C [facility], PRN Provider  [facility]. [place], [place] Clinic Phone: [phone] Clinic Fax: [phone] Clinic Hours: 5:30 pm -7:30 pm (Monday-Friday)

## 2020-01-06 NOTE — Telephone Encounter (Signed)
Pt's pharmacy is requesting a refill on chantix. Would Truitt Merle, NP, like to refill this medication? Please address

## 2020-01-07 LAB — CBC WITH DIFFERENTIAL/PLATELET
Basophils Absolute: 0.1 10*3/uL (ref 0.0–0.2)
Basos: 1 %
EOS (ABSOLUTE): 0.5 10*3/uL — ABNORMAL HIGH (ref 0.0–0.4)
Eos: 7 %
Hematocrit: 45.6 % (ref 34.0–46.6)
Hemoglobin: 15.5 g/dL (ref 11.1–15.9)
Immature Grans (Abs): 0 10*3/uL (ref 0.0–0.1)
Immature Granulocytes: 0 %
Lymphocytes Absolute: 1.1 10*3/uL (ref 0.7–3.1)
Lymphs: 15 %
MCH: 30.4 pg (ref 26.6–33.0)
MCHC: 34 g/dL (ref 31.5–35.7)
MCV: 89 fL (ref 79–97)
Monocytes Absolute: 0.9 10*3/uL (ref 0.1–0.9)
Monocytes: 12 %
Neutrophils Absolute: 5 10*3/uL (ref 1.4–7.0)
Neutrophils: 65 %
Platelets: 400 10*3/uL (ref 150–450)
RBC: 5.1 x10E6/uL (ref 3.77–5.28)
RDW: 12.7 % (ref 11.7–15.4)
WBC: 7.7 10*3/uL (ref 3.4–10.8)

## 2020-01-13 ENCOUNTER — Ambulatory Visit (INDEPENDENT_AMBULATORY_CARE_PROVIDER_SITE_OTHER): Payer: Medicare Other | Admitting: Internal Medicine

## 2020-01-13 VITALS — BP 130/90 | HR 79 | Temp 97.9°F | Ht 65.0 in | Wt 145.0 lb

## 2020-01-13 DIAGNOSIS — J189 Pneumonia, unspecified organism: Secondary | ICD-10-CM | POA: Diagnosis not present

## 2020-01-13 DIAGNOSIS — J441 Chronic obstructive pulmonary disease with (acute) exacerbation: Secondary | ICD-10-CM

## 2020-01-13 MED ORDER — PREDNISONE 50 MG PO TABS: ORAL_TABLET | ORAL | 0 refills | Status: DC

## 2020-01-13 MED ORDER — BENZONATATE 100 MG PO CAPS: 100.0000 mg | ORAL_CAPSULE | Freq: Three times a day (TID) | ORAL | 0 refills | Status: DC | PRN

## 2020-01-13 MED ORDER — HYDROCODONE-HOMATROPINE 5-1.5 MG/5ML PO SYRP: 5.0000 mL | ORAL_SOLUTION | Freq: Two times a day (BID) | ORAL | 0 refills | Status: DC | PRN

## 2020-01-13 NOTE — Progress Notes (Addendum)
Respiratory Clinic Note   Patient has one week follow up for previous respiratory clinic visit on 3/17. At this visit, patient was diagnosed with COPD exacerbation treated with Azithromycin and DepoMedrol injection. CXR was obtained and showed concern for possible developing infiltrate and Doxycycline 100 mg BID x10 days was added. COVID test was negative.   She then returned on 3/24. CXR was stable. Patient with diffuse wheezing and prednisone 40 mg x5 days was added. Solumedrol 125 mg was given in clinic. She was started on [place]. Duonebs were continued and albuterol inhaler was prescribed. CBC obtained was unremarkable.   Today, patient reports she feels like her symptoms have improved. She has now been able to start coughing up phlegm. She needs to use her O2 most of the day. She monitors her pulse ox and it has been staying between 91-96%. She is still having some wheezing and SOB but this has improved. She is followed by pulmonology. Uses nebulizer about 3x per day.    Positive review of systems for Covid infection are documented above in HPI.   Physical exam:  Vitals:   01/13/20 1732  BP: 130/90  Pulse: 79  Temp: 97.9 F (36.6 C)  SpO2: 90%   General appearance: Adequately nourished; no acute distress, increased work of breathing is present.   Lymphatic: No lymphadenopathy about the head, neck, axilla. Eyes: No conjunctival inflammation or lid edema is present. There is no scleral icterus. Ears:  External ear exam shows no significant lesions or deformities.   Nose:  External nasal examination shows no deformity or inflammation. Nasal mucosa are pink and moist without lesions, exudates Oral exam:  Lips and gums are healthy appearing. There is no oropharyngeal erythema or exudate. Neck:  No thyromegaly, masses, tenderness noted.    Heart:  Normal rate and regular rhythm. S1 and S2 normal without gallop, murmur, click, rub .  Lungs: Wheezes at bases bilaterally and  rhonchi throughout lung fields. Moving air well. Not currently wearing O2.  Abdomen: Bowel sounds are normal. Abdomen is soft and nontender with no organomegaly, hernias, masses. GU: Deferred  Extremities:  No cyanosis, clubbing, edema  Neurologic exam :Grossly intact. Normal gait.  Skin: Warm & dry w/o tenting. No significant lesions or rash.  1. COPD exacerbation ([place]) Seems to be improving per patient's report and from review of prior notes; however still has wheezing and rhonchi on lung exam. Will give another burst of steroids. Continue cough suppressants and breathing treatments. Have recommended she f/u with pulmonologist.  - predniSONE (DELTASONE) 50 MG tablet; Take daily for 5 days.  Dispense: 5 tablet; Refill: 0 - benzonatate (TESSALON) 100 MG capsule; Take 1-2 capsules (100-200 mg total) by mouth 3 (three) times daily as needed for cough.  Dispense: 60 capsule; Refill: 0 - HYDROcodone-homatropine (HYCODAN) 5-1.5 MG/5ML syrup; Take 5 mLs by mouth every 12 (twelve) hours as needed for cough.  Dispense: 100 mL; Refill: 0  2. Pneumonia of left lower lobe due to infectious organism Has completed course of antibiotics. May benefit from repeat CXR to ensure resolution pending repeat lung exam with pulmonology. Pulse Ox 90%; however, patient does not have her O2 with her.  - benzonatate (TESSALON) 100 MG capsule; Take 1-2 capsules (100-200 mg total) by mouth 3 (three) times daily as needed for cough.  Dispense: 60 capsule; Refill: 0 - HYDROcodone-homatropine (HYCODAN) 5-1.5 MG/5ML syrup; Take 5 mLs by mouth every 12 (twelve) hours as needed for cough.  Dispense: 100 mL; Refill: 0  Phill Myron, D.O. 01/13/2020, 5:51 PM

## 2020-01-13 NOTE — Patient Instructions (Addendum)
I have prescribed another course of steroids and refilled your cough medication. Continue to monitor your pulse ox at home. If it goes below 88% you should be seen. Please call your pulmonologist for a visit. You can also have follow up here in a week.

## 2020-01-27 ENCOUNTER — Other Ambulatory Visit: Payer: Self-pay | Admitting: Family Medicine

## 2020-01-27 DIAGNOSIS — G4700 Insomnia, unspecified: Secondary | ICD-10-CM

## 2020-01-27 DIAGNOSIS — F419 Anxiety disorder, unspecified: Secondary | ICD-10-CM

## 2020-01-28 ENCOUNTER — Telehealth: Payer: Self-pay | Admitting: Family Medicine

## 2020-01-28 ENCOUNTER — Other Ambulatory Visit: Payer: Self-pay | Admitting: Internal Medicine

## 2020-01-28 NOTE — Telephone Encounter (Signed)
Oer [name] from [facility] wanted to inform the Dr. Marcelline Deist pt has low Blood Pressure readings of 82/54. Pt said she has been experiencing wide ranges from the 80s -87, no dizziness, just tired. The nurse wanted to know if someone can call the pt and follow up

## 2020-01-29 NOTE — Telephone Encounter (Signed)
She has had similar problem in the past. Adequate hydration and fall prevention. Continue monitoring BP. She could sprinkle a little salt on her food, this may help.  Thanks, BJ

## 2020-01-29 NOTE — Telephone Encounter (Signed)
Spoke with patient to follow-up on low blood pressure. Patient stated that she has had this problem for a long time. She stated this has been going on since she lost her son. She also stated that she had a little dizziness on yesterday and her last blood pressure check was 90/70 last night. This morning blood pressure was 110/60.

## 2020-02-01 NOTE — Telephone Encounter (Signed)
Patient notified and verbalized understanding. 

## 2020-02-04 ENCOUNTER — Other Ambulatory Visit: Payer: Self-pay | Admitting: Nurse Practitioner

## 2020-02-15 ENCOUNTER — Emergency Department (HOSPITAL_COMMUNITY): Payer: Medicare Other

## 2020-02-15 ENCOUNTER — Emergency Department (HOSPITAL_COMMUNITY)
Admission: EM | Admit: 2020-02-15 | Discharge: 2020-02-15 | Disposition: A | Discharge status: Home / Self Care | Payer: Medicare Other | Attending: Emergency Medicine | Admitting: Emergency Medicine

## 2020-02-15 ENCOUNTER — Other Ambulatory Visit: Payer: Self-pay

## 2020-02-15 ENCOUNTER — Telehealth: Payer: Self-pay | Admitting: Family Medicine

## 2020-02-15 DIAGNOSIS — Z853 Personal history of malignant neoplasm of breast: Secondary | ICD-10-CM | POA: Insufficient documentation

## 2020-02-15 DIAGNOSIS — E039 Hypothyroidism, unspecified: Secondary | ICD-10-CM | POA: Insufficient documentation

## 2020-02-15 DIAGNOSIS — I959 Hypotension, unspecified: Secondary | ICD-10-CM

## 2020-02-15 DIAGNOSIS — I10 Essential (primary) hypertension: Secondary | ICD-10-CM | POA: Insufficient documentation

## 2020-02-15 DIAGNOSIS — Z86718 Personal history of other venous thrombosis and embolism: Secondary | ICD-10-CM | POA: Diagnosis not present

## 2020-02-15 DIAGNOSIS — J449 Chronic obstructive pulmonary disease, unspecified: Secondary | ICD-10-CM | POA: Diagnosis not present

## 2020-02-15 DIAGNOSIS — Z79899 Other long term (current) drug therapy: Secondary | ICD-10-CM | POA: Diagnosis not present

## 2020-02-15 DIAGNOSIS — Z7982 Long term (current) use of aspirin: Secondary | ICD-10-CM | POA: Diagnosis not present

## 2020-02-15 DIAGNOSIS — M545 Low back pain: Secondary | ICD-10-CM | POA: Diagnosis present

## 2020-02-15 DIAGNOSIS — Z87891 Personal history of nicotine dependence: Secondary | ICD-10-CM | POA: Diagnosis not present

## 2020-02-15 LAB — URINALYSIS, ROUTINE W REFLEX MICROSCOPIC
Bilirubin Urine: NEGATIVE
Glucose, UA: NEGATIVE mg/dL
Hgb urine dipstick: NEGATIVE
Ketones, ur: 20 mg/dL — AB
Leukocytes,Ua: NEGATIVE
Nitrite: NEGATIVE
Protein, ur: NEGATIVE mg/dL
Specific Gravity, Urine: 1.009 (ref 1.005–1.030)
pH: 6 (ref 5.0–8.0)

## 2020-02-15 LAB — BASIC METABOLIC PANEL
Anion gap: 11 (ref 5–15)
BUN: 8 mg/dL (ref 8–23)
CO2: 22 mmol/L (ref 22–32)
Calcium: 9.3 mg/dL (ref 8.9–10.3)
Chloride: 104 mmol/L (ref 98–111)
Creatinine, Ser: 0.87 mg/dL (ref 0.44–1.00)
GFR calc Af Amer: >60 mL/min (ref >60)
GFR calc non Af Amer: >60 mL/min (ref >60)
Glucose, Bld: 99 mg/dL (ref 70–99)
Potassium: 4 mmol/L (ref 3.5–5.1)
Sodium: 137 mmol/L (ref 135–145)

## 2020-02-15 LAB — TROPONIN I (HIGH SENSITIVITY)
Troponin I (High Sensitivity): 10 ng/L (ref <18)
Troponin I (High Sensitivity): 12 ng/L (ref <18)

## 2020-02-15 LAB — CBC
HCT: 41.7 % (ref 36.0–46.0)
Hemoglobin: 13.9 g/dL (ref 12.0–15.0)
MCH: 31.2 pg (ref 26.0–34.0)
MCHC: 33.3 g/dL (ref 30.0–36.0)
MCV: 93.5 fL (ref 80.0–100.0)
Platelets: 334 10*3/uL (ref 150–400)
RBC: 4.46 MIL/uL (ref 3.87–5.11)
RDW: 14.6 % (ref 11.5–15.5)
WBC: 11.4 10*3/uL — ABNORMAL HIGH (ref 4.0–10.5)
nRBC: 0 % (ref 0.0–0.2)

## 2020-02-15 LAB — LACTIC ACID, PLASMA: Lactic Acid, Venous: 1.2 mmol/L (ref 0.5–1.9)

## 2020-02-15 MED ORDER — SODIUM CHLORIDE 0.9 % IV BOLUS
1000.0000 mL | Freq: Once | INTRAVENOUS | Status: AC
  Administered 2020-02-15: 1000 mL via INTRAVENOUS

## 2020-02-15 MED ORDER — SODIUM CHLORIDE 0.9% FLUSH
3.0000 mL | Freq: Once | INTRAVENOUS | Status: AC
  Administered 2020-02-15: 3 mL via INTRAVENOUS

## 2020-02-15 NOTE — Telephone Encounter (Signed)
[place] Nurse called to let us know that she just spoke with the patient and the patient said that she is having chills and hot and SOB oxygen is 89.  The Nurse was giving [place] a call to make [place] aware of the patients symptoms since she's had recent lung infections.  Please advise

## 2020-02-15 NOTE — ED Notes (Signed)
Koleen Nimrod husband [phone] looking for update on patient

## 2020-02-15 NOTE — Discharge Instructions (Signed)
Please discuss with your family doctor your low blood pressure in the morning.  It appears that one of the medicines you are on trazodone can cause a problem like this.  Please discuss it with your doctor and see if they want to try to stop that medicine or change to something else to see if it improves your symptoms.  Please take your time in the morning getting up so you do not fall.  Please return to the ED for fever cough shortness of breath abdominal pain or urinary symptoms.

## 2020-02-15 NOTE — Telephone Encounter (Signed)
Patient went to ED. PCP is aware.

## 2020-02-15 NOTE — Telephone Encounter (Signed)
She has history of hypoxia, so 89% O2 sat at [facility] she has had before. If this is not her baseline and getting worse,she needs to seek immediate medical attention.  If this is not far from her baseline she can monitor temp and start Albuterol inh 2 puff every 6 hours for a week then as needed for wheezing or shortness of breath.  She can also have a virtual visit. Thanks, BJ

## 2020-02-15 NOTE — Telephone Encounter (Signed)
FYI sent to Dr. Martinique for review.

## 2020-02-15 NOTE — ED Notes (Signed)
Patient verbalizes understanding of discharge instructions. Opportunity for questioning and answers were provided. Armband removed by staff, pt discharged from ED to home 

## 2020-02-15 NOTE — Telephone Encounter (Signed)
Message Routed to PCP for review. 

## 2020-02-15 NOTE — ED Provider Notes (Signed)
[facility] EMERGENCY DEPARTMENT Provider Note   CSN: [id number] Arrival date & time: 02/15/20  1311     History No chief complaint on file.   [name] is a 74 y.o. female.  74 yo F with a chief complaints of hypertension.  Patient states she has been checking her blood pressure in the mornings and in the evenings and realized that in the morning her blood pressure was in the 80s pretty consistently.  She had a checked at a different blood pressure cuff and it was consistent.  She has been feeling a bit weak especially in the morning when she wakes up.  She was recently treated for pneumonia and had a 28 treatments of antibiotics.  She still coughing a little bit but thinks it is a little bit better.  She felt bad yesterday but has trouble quantifying that much further.  Denied abdominal pain denied nausea vomiting or diarrhea.  States that she did have diarrhea about a week ago which resolved.  Been treated for colitis [facility].  She denies dark stool or blood in her stool denies urinary symptoms denies fevers.  Feels that she has been eating and drinking normally.  That she has been much more thirsty recently.  She denies being on blood pressure medicines.  Last took them about 3 years ago.  Was told she no longer needed them.  The history is provided by the patient.  Illness Severity:  Moderate Onset quality:  Gradual Duration:  1 week Timing:  Constant Progression:  Worsening Chronicity:  New      Past Medical History:  Diagnosis Date  . Bilateral leg cramps   . Bladder neoplasm   . Chronic deep vein thrombosis (DVT) of right lower extremity ([place])    05/ 2018  chronic non-occlusive DVT RLE  . COPD, frequent exacerbations ([place])    03-06-2018 per pt last exacerbation 12/ 2018  . Coronary artery disease    cardiologist-  dr Aundra Dubin-- 03-11-2018 er cath , 80% stenosis in the ostial second diagonal  . Dyspnea on minimal exertion   . Emphysema/COPD ([place])    CAT score- 17  . Family history of breast cancer   . Family history of colon cancer   . Family history of melanoma   . Family history of ovarian cancer   . Family history of pancreatic cancer   . Fibromyalgia 1995  . GERD (gastroesophageal reflux disease)   . Hiatal hernia   . History of breast cancer   . History of diverticulitis of colon    2002  s/p  sigmoid colectomy  . History of multiple pulmonary nodules    hx RUL nodules x2  s/p right VATS w/ wedge resection 09-11-2005 and 06-14-2011  both  necrotizing granulomatous inflammation w/ cystic area of necrosis & focal calcification  . History of right breast cancer oncologist-  dr Jana Hakim-- no recurrence   dx 2010--  IDC, Stage IA , ER/PR+,  (pT1c pN0) 11-09-2008 right lumpectomy;  12-18-2008 right simple mastectomy for DCIS margins;  completed chemotherapy 2010 (no radiation) and completed antiestrogen therapy  . Hx of colonic polyps    Dr. Cristina Gong -last study '11  . Hyperlipidemia   . Hypertension   . Hypothyroidism   . 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 Nocturia   . OA (osteoarthritis)    thumbs  . On supplemental oxygen therapy    03-06-2018 per pt uses only at  night,  checks O2 sats at home,  stated am sat 89% after moving around average 93-94% with RA  . Osteoporosis   . Peripheral arterial occlusive disease [facility]) vascular-- dr chen/ dr Donzetta Matters   proximal right SFA severe focal stenosis with collaterals from the PFA/  04/ 2012 occluded celiac and SMA arteries with distal reconstitution w/ patent IMA  . Peripheral vascular disease ([place])    chronic DVT RLE,  mesenteric vascular disease  . Right lower lobe pulmonary nodule    Chest CT 08-29-2017  . Stage 3 severe COPD by GOLD classification ([place]) hx frequent exacerbations--   pulmologist-  dr Maryann Alar--  per lov note , dated 12-20-2017, oxyogen 2L is prescribed for use with exertion (but pt only uses mostly at night), O2 sats on RA run  the 80s , this day sat 86% RA and with 2L O2 sat 92%  . Varicose veins of leg with swelling    varicose vein surgery - Dr. Aleda Grana  . Wears glasses   . Wears hearing aid in both ears     Patient Active Problem List   Diagnosis Date Noted  . Genetic testing 12/12/2018  . Family history of breast cancer   . Family history of ovarian cancer   . Family history of pancreatic cancer   . Family history of colon cancer   . Family history of melanoma   . History of breast cancer   . Chronic respiratory failure with hypoxia ([place]) 10/14/2017  . COPD with acute exacerbation ([place]) 10/10/2017  . Flu vaccine need 09/09/2017  . Anxiety disorder 09/09/2017  . Fibromyalgia 09/09/2017  . Thrombocytosis ([place]) 06/21/2017  . Malignant neoplasm of overlapping sites of right breast in female, estrogen receptor positive ([place]) 06/05/2017  . Edema of right lower extremity 02/26/2017  . Positive ANA (antinuclear antibody) 02/08/2017  . Elevated IgE level and eosinophilia 12/31/2016  . Oral thrush 11/28/2016  . Rash and nonspecific skin eruption 11/28/2016  . Allergic rhinitis 11/28/2016  . History of bacterial pneumonia 08/09/2016  . Routine general medical examination at a health care facility 08/12/2015  . Insomnia 03/04/2015  . Breast cancer, right breast ([place]) 02/17/2015  . CAD (coronary artery disease) 07/11/2011  . Tobacco abuse 03/27/2011  . Chronic mesenteric ischemia ([place]) 03/27/2011  . Solitary pulmonary nodule 03/27/2011  . PAD (peripheral artery disease) ([place]) 03/04/2011  . Arthropathy 11/09/2010  . Hypothyroidism 09/20/2009  . Hyperlipidemia 09/20/2009  . Hearing loss 09/14/2008  . Essential hypertension 09/08/2007  . COPD, frequent exacerbations ([place]) 09/08/2007  . GERD 09/08/2007    Past Surgical History:  Procedure Laterality Date  . BREAST EXCISIONAL BIOPSY Left 10/15/2008  . BREAST LUMPECTOMY W/ NEEDLE LOCALIZATION Right 11-09-2008    dr Brantley Stage   [facility]  . CARDIAC  CATHETERIZATION  03-12-2011   dr Aundra Dubin   70-80% ostial stenosis in small second diagonal (appears to small for intervention),  dLAD 40-50%,  minimal luminal irregulartieis involoving LCFx and RCA,  LVEF 55%  . CATARACT EXTRACTION W/ INTRAOCULAR LENS  IMPLANT, BILATERAL  2011  . CYSTOSCOPY N/A 03/11/2018   Procedure: CYSTOSCOPY WITH INSTILLATION OF POST OPERATIVE EPIRUBICIN;  Surgeon: Festus Aloe, MD;  Location: [facility];  Service: Urology;  Laterality: N/A;  . MASTECTOMY Right   . PARTIAL COLECTOMY  2002   sigmoid and appectomy (diverticulitis)  . PARTIAL MASTECTOMY WITH NEEDLE LOCALIZATION Left 02/23/2013   Procedure:  LEFT PARTIAL MASTECTOMY WITH NEEDLE LOCALIZATION;  Surgeon: Adin Hector, MD;  Location: [facility];  Service: General;  Laterality: Left;  . RECONSTRUCTION BREAST W/ LATISSIMUS DORSI FLAP Right 05-30-2009   dr Harlow Mares  [facility]  . REDUCTION MAMMAPLASTY Left   . SIMPLE MASTECTOMY Right 12-28-2008    dr Brantley Stage  [facility]  . TRANSTHORACIC ECHOCARDIOGRAM  07-09-2016  dr Aundra Dubin   ef 55-60%, grade 1 diastoic dysfunction/  mild TR  . TRANSURETHRAL RESECTION OF BLADDER TUMOR N/A 03/11/2018   Procedure: TRANSURETHRAL RESECTION OF BLADDER TUMOR (TURBT) 2-5cm;  Surgeon: Festus Aloe, MD;  Location: [facility];  Service: Urology;  Laterality: N/A;  . VAGINAL HYSTERECTOMY  1973  . VIDEO ASSISTED THORACOSCOPY (VATS)/WEDGE RESECTION Right 09-11-2005  &  06-14-2011   dr hendrickso  [facility]   both RUL     OB History   No obstetric history on file.     Family History  Problem Relation Age of Onset  . Colon cancer Mother        colon  . COPD Mother        brown lung  . Hypertension Mother   . Diabetes Mother   . Emphysema Mother   . Coronary artery disease Father   . Heart attack Father   . Sudden death Father   . Heart disease Father   . Cancer Sister        throat  . Hypertension Sister   . Coronary artery disease Brother   . Heart attack Brother        early  48s  . Throat cancer Sister   . Ovarian cancer Sister        fallopian tube cancer in her 65s  . Melanoma Sister   . Hypertension Sister   . Pancreatic cancer Sister   . Melanoma Niece        dx in her 78s  . Breast cancer Niece 69    Social History   Tobacco Use  . Smoking status: Former Smoker    Packs/day: 0.75    Years: 50.00    Pack years: 37.50    Types: Cigarettes    Quit date: 05/18/2016    Years since quitting: 3.7  . Smokeless tobacco: Never Used  Substance Use Topics  . Alcohol use: Yes    Alcohol/week: 10.0 standard drinks    Types: 10 Cans of beer per week  . Drug use: No    Home Medications Prior to Admission medications   Medication Sig Start Date End Date Taking? Authorizing Provider  acetaminophen (TYLENOL) 500 MG tablet Take 1,500 mg by mouth daily.   Yes [provider]  aspirin EC 81 MG tablet Take 81 mg by mouth daily.   Yes [provider]  atorvastatin (LIPITOR) 80 MG tablet TAKE 1 TABLET BY MOUTH EVERY DAY Patient taking differently: Take 80 mg by mouth daily.  04/10/19  Yes [name], NP  b complex vitamins tablet Take 1 tablet by mouth daily.   Yes [provider]  Biotin 1000 MCG tablet Take 1,000 mcg by mouth daily.   Yes [provider]  cilostazol (PLETAL) 100 MG tablet Take 1 tablet (100 mg total) by mouth 2 (two) times daily. 11/23/19  Yes [name], NP  fluticasone (FLONASE) 50 MCG/ACT nasal spray PLACE 1 SPRAY INTO BOTH NOSTRILS 2 (TWO) TIMES DAILY. 08/21/19  Yes Martinique, [name], MD  Fluticasone-Umeclidin-Vilant (TRELEGY ELLIPTA) 100-62.5-25 MCG/INH AEPB Inhale 1 puff into the lungs daily. 11/23/19  Yes Martyn Ehrich, NP  ipratropium-albuterol (DUONEB) 0.5-2.5 (3) MG/3ML SOLN Inhale  3 mLs into the lungs every 6 (six) hours as needed (sob, wheezing). 01/28/20  Yes Brand Males, MD  levothyroxine (SYNTHROID) 112 MCG tablet TAKE 1 TABLET (112 MCG TOTAL) BY MOUTH DAILY BEFORE BREAKFAST. 12/21/19   Yes Martinique, [name], MD  LORazepam (ATIVAN) 2 MG tablet TAKE 1 TABLET BY MOUTH AT BEDTIME AS NEEDED FOR ANXIETY Patient taking differently: Take 2 mg by mouth at bedtime.  01/29/20  Yes Martinique, [name], MD  omeprazole (PRILOSEC) 40 MG capsule TAKE 1 CAPSULE BY MOUTH TWICE A DAY Patient taking differently: Take 40 mg by mouth 2 (two) times daily.  12/21/19  Yes Martinique, [name], MD  traZODone (DESYREL) 100 MG tablet Take 1 tablet (100 mg total) by mouth at bedtime. 09/21/19  Yes Martinique, [name], MD  benzonatate (TESSALON) 100 MG capsule Take 1-2 capsules (100-200 mg total) by mouth 3 (three) times daily as needed for cough. Patient not taking: Reported on 02/15/2020 01/13/20   Nicolette Bang, DO  famotidine (PEPCID) 20 MG tablet TAKE 1 TABLET BY MOUTH TWICE A DAY Patient not taking: Reported on 02/15/2020 01/01/20   Martinique, [name], MD  HYDROcodone-homatropine [facility] 5-1.5 MG/5ML syrup Take 5 mLs by mouth every 12 (twelve) hours as needed for cough. Patient not taking: Reported on 02/15/2020 01/13/20   Nicolette Bang, DO    Allergies    Sulfa antibiotics  Review of Systems   Review of Systems  Physical Exam Updated Vital Signs BP (!) 162/102 (BP Location: Left Arm)   Pulse 82   Temp (!) 97 F (36.1 C) (Oral)   Resp 16   SpO2 97%   Physical Exam Vitals and nursing note reviewed.  Constitutional:      General: She is not in acute distress.    Appearance: She is well-developed. She is not diaphoretic.  HENT:     Head: Normocephalic and atraumatic.  Eyes:     Pupils: Pupils are equal, round, and reactive to light.  Cardiovascular:     Rate and Rhythm: Normal rate and regular rhythm.     Heart sounds: No murmur. No friction rub. No gallop.   Pulmonary:     Effort: Pulmonary effort is normal.     Breath sounds: No wheezing or rales.  Abdominal:     General: There is no distension.     Palpations: Abdomen is soft.     Tenderness: There is no abdominal tenderness.    Musculoskeletal:        General: No tenderness.     Cervical back: Normal range of motion and neck supple.  Skin:    General: Skin is warm and dry.  Neurological:     Mental Status: She is alert and oriented to person, place, and time.  Psychiatric:        Behavior: Behavior normal.     ED Results / Procedures / Treatments   Labs (all labs ordered are listed, but only abnormal results are displayed) Labs Reviewed  CBC - Abnormal; Notable for the following components:      Result Value   WBC 11.4 (*)    All other components within normal limits  URINALYSIS, ROUTINE W REFLEX MICROSCOPIC - Abnormal; Notable for the following components:   APPearance HAZY (*)    Ketones, ur 20 (*)    All other components within normal limits  BASIC METABOLIC PANEL  LACTIC ACID, PLASMA  TROPONIN I (HIGH SENSITIVITY)  TROPONIN I (HIGH SENSITIVITY)    EKG EKG  Interpretation  Date/Time:  Monday Feb 15 2020 13:26:57 EDT Ventricular Rate:  83 PR Interval:  150 QRS Duration: 88 QT Interval:  374 QTC Calculation: 439 R Axis:   72 Text Interpretation: Sinus rhythm with occasional Premature ventricular complexes Septal infarct , age undetermined Abnormal ECG No significant change since last tracing Confirmed by Deno Etienne [phone]) on 02/15/2020 5:20:46 PM   Radiology DG Chest 2 View  Result Date: 02/15/2020 CLINICAL DATA:  Chest pain EXAM: CHEST - 2 VIEW COMPARISON:  01/06/2020 FINDINGS: The heart size and mediastinal contours are within normal limits. Atherosclerotic calcification of the aortic knob. Postsurgical changes within the peripheral aspect of the right upper lobe in the right lung apex. Hyperexpanded lungs with mildly coarsened interstitial markings bilaterally. Improved aeration of the left lower lobe compared to prior. No new focal airspace consolidation. No pleural effusion or pneumothorax. The visualized skeletal structures are unremarkable. IMPRESSION: 1. No active cardiopulmonary  disease. 2. Improved aeration of the left lower lobe compared to prior. Electronically Signed   By: Davina Poke D.O.   On: 02/15/2020 14:06    Procedures Procedures (including critical care time)  Medications Ordered in ED Medications  sodium chloride flush (NS) 0.9 % injection 3 mL (3 mLs Intravenous Given 02/15/20 1738)  sodium chloride 0.9 % bolus 1,000 mL (1,000 mLs Intravenous New Bag/Given 02/15/20 1948)    ED Course  I have reviewed the triage vital signs and the nursing notes.  Pertinent labs & imaging results that were available during my care of the patient were reviewed by me and considered in my medical decision making (see chart for details).    MDM Rules/Calculators/A&P                      74 yo F with a chief complaints of hypertension.  This was measured at home.  Blood pressure here is slightly hypertensive.  She is well-appearing and nontoxic.  She has a mild cough on exam but clear lungs.  Will obtain lab work chest x-ray UA.  Chest x-ray viewed by me with resolution of prior infiltrate.  I reviewed the patient's chart, she is also called her doctor and mention that she was hypoxic at home though did not mention this to me.  Not hypoxic on our monitor.  Will ambulate.  Able to ambulate without difficulty.  Lactate is normal.  UA without infection.  Very mild leukocytosis at 11.4.  EKG without concerning finding.  Given a bolus of fluids without significant change in the patient's symptoms.  As the patient's symptoms occur only first thing in the morning I wonder if this is due to her medication.  Trazodone is typically taken at night and can cause hypotension on medicine review.  Discussed this with the patient.  She endorses that when she started taking this medicine after being changed from amitriptyline she did think she started having the symptoms.  We will have her discuss this with her family doctor.  9:29 PM:  I have discussed the diagnosis/risks/treatment  options with the patient and believe the pt to be eligible for discharge home to follow-up with PCP. We also discussed returning to the ED immediately if new or worsening sx occur. We discussed the sx which are most concerning (e.g., sudden worsening pain, fever, inability to tolerate by mouth) that necessitate immediate return. Medications administered to the patient during their visit and any new prescriptions provided to the patient are listed below.  Medications given during  this visit Medications  sodium chloride flush (NS) 0.9 % injection 3 mL (3 mLs Intravenous Given 02/15/20 1738)  sodium chloride 0.9 % bolus 1,000 mL (1,000 mLs Intravenous New Bag/Given 02/15/20 1948)     The patient appears reasonably screen and/or stabilized for discharge and I doubt any other medical condition or other [facility] requiring further screening, evaluation, or treatment in the ED at this time prior to discharge.   Final Clinical Impression(s) / ED Diagnoses Final diagnoses:  Hypotension, unspecified hypotension type    Rx / DC Orders ED Discharge Orders    None       Deno Etienne, DO 02/15/20 2129

## 2020-02-15 NOTE — ED Notes (Signed)
Pt has been having some chest tightness but relates it to the PNA

## 2020-02-15 NOTE — ED Triage Notes (Signed)
Pt here from home with c/o low b/p pt states that her pressure was in the 80's at home , b/p 113/99 here , pt did recently have pna

## 2020-02-15 NOTE — Telephone Encounter (Signed)
[facility] healthcare nurse  Elmyra Ricks wanted to inform Dr. Martinique that the pt is going to the emergency room due to her BP being low  81/57. When she got home, it was 79/63

## 2020-02-15 NOTE — ED Notes (Signed)
Urine Culture was sent down to the lab with the urine specimens.

## 2020-02-26 ENCOUNTER — Telehealth: Payer: Self-pay | Admitting: Family Medicine

## 2020-02-26 DIAGNOSIS — R296 Repeated falls: Secondary | ICD-10-CM

## 2020-02-26 DIAGNOSIS — G35 Multiple sclerosis: Secondary | ICD-10-CM

## 2020-02-26 NOTE — Telephone Encounter (Signed)
[name] from [facility] called to let Dr. Martinique know that the patient fell twice 2 days ago and has bruises on her back.  She had vertigo 2 days ago but not now.

## 2020-02-29 NOTE — Telephone Encounter (Signed)
We could arrange PT for fall prevention. Monitor for worsening pain or MS changes. Thanks, BJ

## 2020-02-29 NOTE — Telephone Encounter (Signed)
Noted  

## 2020-03-02 NOTE — Telephone Encounter (Signed)
Noted  Referral placed.

## 2020-03-02 NOTE — Addendum Note (Signed)
Addended by: Gwenyth Ober R on: 03/02/2020 01:13 PM   Modules accepted: Orders

## 2020-03-11 ENCOUNTER — Telehealth: Payer: Self-pay | Admitting: Family Medicine

## 2020-03-11 NOTE — Telephone Encounter (Signed)
Noted. FYI sent to PCP. PCP out of the office until 03/21/2020.

## 2020-03-11 NOTE — Telephone Encounter (Signed)
Elmyra Ricks from [facility] (nurse) spoke to the pt today. The pt has been recording her bp for part of a program with [facility]. On Monday her bp was 93/60 and today it was 97/60. While she was on the phone with her today she re-checked it and it was 125/77. Pt states she really hasnt had an appetite due to feeling nauseated. Elmyra Ricks wanted to make her PCP aware.   Elmyra Ricks can be reached at [phone] ext [phone]

## 2020-03-18 ENCOUNTER — Telehealth: Payer: Self-pay | Admitting: Internal Medicine

## 2020-03-18 ENCOUNTER — Ambulatory Visit (INDEPENDENT_AMBULATORY_CARE_PROVIDER_SITE_OTHER): Payer: Medicare Other | Admitting: Adult Health

## 2020-03-18 ENCOUNTER — Other Ambulatory Visit: Payer: Self-pay

## 2020-03-18 ENCOUNTER — Encounter: Payer: Self-pay | Admitting: Adult Health

## 2020-03-18 DIAGNOSIS — J441 Chronic obstructive pulmonary disease with (acute) exacerbation: Secondary | ICD-10-CM | POA: Diagnosis not present

## 2020-03-18 DIAGNOSIS — J9611 Chronic respiratory failure with hypoxia: Secondary | ICD-10-CM

## 2020-03-18 MED ORDER — AZITHROMYCIN 250 MG PO TABS: ORAL_TABLET | ORAL | 0 refills | Status: AC

## 2020-03-18 MED ORDER — PREDNISONE 10 MG PO TABS: ORAL_TABLET | ORAL | 0 refills | Status: DC

## 2020-03-18 NOTE — Patient Instructions (Signed)
Zpack take as directed  Prednisone taper over next week  Mucinex DM Twice daily  As needed  Cough /congestion  Duoneb Three times a day   TRELEGY 1 puff daily.  Continue On Oxygen 2l/m  COVID 19 test . Follow up with Dr. Chase Caller in 1-2 weeks and As needed   Please contact office for sooner follow up if symptoms do not improve or worsen or seek emergency care

## 2020-03-18 NOTE — Telephone Encounter (Signed)
Spoke with the pt  She states had PNA 6 wks ago  She c/o on prod cough since then, no appetite, weakness, sweats  Televisit with TP at 4:30 today

## 2020-03-18 NOTE — Progress Notes (Signed)
Virtual Visit via Telephone Note  I connected with [name] on 03/18/20 at  4:30 PM EDT by telephone and verified that I am speaking with the correct person using two identifiers.  Location: Patient: Home  Provider: Office    I discussed the limitations, risks, security and privacy concerns of performing an evaluation and management service by telephone and the availability of in person appointments. I also discussed with the patient that there may be a patient responsible charge related to this service. The patient expressed understanding and agreed to proceed.   History of Present Illness: 74 yo female former smoker followed for COPD GOLD III and Oxygen dependent Respiratory Failure on Oxygen   Today's televisit is for an acute office visit . Complains of 2 weeks of increased cough,congestion and wheezing .gets winded easily. Breathing has not been doing as well over last 2 months since having Pneumonia in March.  Patient complains that she is now having to wear her oxygen all the time at 2 L.  O2 saturations have been good at 94 to 96%.  She denies any hemoptysis, chest pain, orthopnea, edema.  Appetite is good.  No nausea vomiting or diarrhea.  No loss of taste or smell.  Has received 1 Covid vaccine in March but has not received her second booster.   Observations/Objective:  11/06/2018 Spirometry - FVC 2.1 (66%), FEV1 1.0 (43%), ratio 49  Assessment and Plan: Acute COPD exacerbation.-We will treat with course of antibiotics and steroids.  Get her back for close follow-up in 1 to 2 weeks.  May need additional imaging.  Follow-up chest x-ray from pneumonia showed improvement from March. Since she has acute respiratory symptoms feel that she needs COVID-19 testing  Chronic respiratory failure.  Patient now using oxygen 2 L at all times.  Patient is continued oxygen to keep O2 saturations greater than 88 to 90%.  Patient is advised if symptoms do not improve or worsen she is to  contact us sooner for follow-up.  And to seek and/or seek emergency room care.  Plan  Patient Instructions  Zpack take as directed  Prednisone taper over next week  Mucinex DM Twice daily  As needed  Cough /congestion  Duoneb Three times a day   TRELEGY 1 puff daily.  Continue On Oxygen 2l/m  COVID 19 test . Follow up with Dr. Chase Caller in 1-2 weeks and As needed   Please contact office for sooner follow up if symptoms do not improve or worsen or seek emergency care       Follow Up Instructions: Follow-up in 1 to 2 weeks and as needed Please contact office for sooner follow up if symptoms do not improve or worsen or seek emergency care     I discussed the assessment and treatment plan with the patient. The patient was provided an opportunity to ask questions and all were answered. The patient agreed with the plan and demonstrated an understanding of the instructions.   The patient was advised to call back or seek an in-person evaluation if the symptoms worsen or if the condition fails to improve as anticipated.  I provided 22 minutes of non-face-to-face time during this encounter.   Rexene Edison, NP

## 2020-03-21 ENCOUNTER — Ambulatory Visit: Payer: Medicare Other | Attending: Internal Medicine

## 2020-03-21 ENCOUNTER — Ambulatory Visit: Payer: Self-pay

## 2020-03-21 DIAGNOSIS — Z20822 Contact with and (suspected) exposure to covid-19: Secondary | ICD-10-CM

## 2020-03-22 LAB — SARS-COV-2, NAA 2 DAY TAT

## 2020-03-22 LAB — NOVEL CORONAVIRUS, NAA: SARS-CoV-2, NAA: NOT DETECTED

## 2020-03-23 ENCOUNTER — Other Ambulatory Visit: Payer: Self-pay | Admitting: Nurse Practitioner

## 2020-03-23 DIAGNOSIS — I2510 Atherosclerotic heart disease of native coronary artery without angina pectoris: Secondary | ICD-10-CM

## 2020-03-23 DIAGNOSIS — E7800 Pure hypercholesterolemia, unspecified: Secondary | ICD-10-CM

## 2020-03-23 DIAGNOSIS — I10 Essential (primary) hypertension: Secondary | ICD-10-CM

## 2020-03-23 NOTE — Telephone Encounter (Signed)
Hx of HTN, not longer on antihypertensive therapy. She has reported hypotensive episodes for a few months now. Hx of CAD. Cardiology evaluation can be arranged. Thanks, BJ

## 2020-03-24 NOTE — Telephone Encounter (Signed)
Left detailed message informing Elmyra Ricks of [place] message.  Elmyra Ricks is on vacation until 6/17.  Do I need to place order for referral for Cardio?

## 2020-03-28 ENCOUNTER — Telehealth: Payer: Self-pay | Admitting: Family Medicine

## 2020-03-28 NOTE — Telephone Encounter (Signed)
Carmell Austria a RN from [facility] stated the pt is enrolled in their care management program and she needs to report some vital signs. Pt's oxygen was 90% and she is on 2 liters of oxygen and reports SOB with activity but improves with rest. Pt had low bp readings today  84/53, 79/51, 86/61 taken around 7:30am and 8:30am. Pt reports dizziness with bp and that it always runs low. Carmell Austria wanted her to repeat her vital signs but her equipment was down stairs and didn't want her to do that since she felt dizzy but the pt stated she will check it later when she feels better. Carmell Austria wanted to make her PCP aware of the low bp and the pts symptoms.   Carmell Austria can be reached at [phone] ext [phone] (call for questions)   Pt can be reached at [phone]

## 2020-03-28 NOTE — Telephone Encounter (Signed)
Message Routed to PCP for review. 

## 2020-03-29 ENCOUNTER — Other Ambulatory Visit: Payer: Self-pay | Admitting: Family Medicine

## 2020-03-29 DIAGNOSIS — I959 Hypotension, unspecified: Secondary | ICD-10-CM

## 2020-03-29 NOTE — Telephone Encounter (Signed)
I placed referral to cardiologist. For now continue adequate hydration and move slowly to prevent falls. Continue monitoring BP's. Thanks, BJ

## 2020-03-29 NOTE — Telephone Encounter (Signed)
Spoke with patient and gave recommendations per Dr. Martinique. Patient stated that she has cardiology appointment in August. Patient stated that low b/p is an ongoing issue for her and that Erasmo Downer is a new nurse that didn't know the history. Patient stated that she will continue to monitor b/p a few times daily.

## 2020-03-29 NOTE — Telephone Encounter (Signed)
Cardiology referral placed due to symptomatic episodes of hypotension. Past Hx of HTN,not longer on antihypertensive medications. Kadarious Dikes [place], MD

## 2020-04-01 ENCOUNTER — Telehealth: Payer: Self-pay | Admitting: Family Medicine

## 2020-04-01 NOTE — Telephone Encounter (Signed)
[place] healthcare nurse called in about patient's BP-- 97/62, Oxygen level was 92 without oxygen, feeling extremely tired, no appetite- family is having to make her eat. She feels like patient needs a call- pt wouldn't stay on the line with her.

## 2020-04-01 NOTE — Telephone Encounter (Signed)
She has long hx of hypotension, which I think is causing symptoms. I already placed cardiology referral. If symptoms get worse suddenly,she needs to seek immediate medical attention. Thanks, BJ

## 2020-04-01 NOTE — Telephone Encounter (Signed)
Spoke to and she stated that she feels a little fatigue. Pt claimed that she has been feeling like this since Feb. 2021 after they told her she had pneumonia. I asked pt did she /do a f/u Xray after taking abx pt stated she did and it stated her pneumonia was cleared. Pt articulated that her bp readings has been as low has 80/57 but as the day goes on it eill get up to 130/72. PT advised that I will see what Dr.Jordan advises before I schedule an appt. Since she is full for a while and it is Friday.

## 2020-04-04 NOTE — Telephone Encounter (Signed)
Patient notified of update  and verbalized understanding. 

## 2020-04-04 NOTE — Telephone Encounter (Signed)
Left message to return phone call with spouse

## 2020-04-06 ENCOUNTER — Other Ambulatory Visit: Payer: Self-pay

## 2020-04-06 ENCOUNTER — Other Ambulatory Visit: Payer: Self-pay | Admitting: *Deleted

## 2020-04-06 ENCOUNTER — Encounter: Payer: Self-pay | Admitting: Primary Care

## 2020-04-06 ENCOUNTER — Telehealth: Payer: Self-pay | Admitting: Internal Medicine

## 2020-04-06 ENCOUNTER — Ambulatory Visit: Payer: Medicare Other | Admitting: Primary Care

## 2020-04-06 ENCOUNTER — Ambulatory Visit (INDEPENDENT_AMBULATORY_CARE_PROVIDER_SITE_OTHER): Payer: Medicare Other

## 2020-04-06 DIAGNOSIS — W19XXXA Unspecified fall, initial encounter: Secondary | ICD-10-CM

## 2020-04-06 DIAGNOSIS — J441 Chronic obstructive pulmonary disease with (acute) exacerbation: Secondary | ICD-10-CM

## 2020-04-06 DIAGNOSIS — R0602 Shortness of breath: Secondary | ICD-10-CM

## 2020-04-06 MED ORDER — PREDNISONE 10 MG PO TABS: ORAL_TABLET | ORAL | 0 refills | Status: DC

## 2020-04-06 MED ORDER — LEVOFLOXACIN 500 MG PO TABS: 500.0000 mg | ORAL_TABLET | Freq: Every day | ORAL | 0 refills | Status: DC

## 2020-04-06 MED ORDER — ALBUTEROL SULFATE HFA 108 (90 BASE) MCG/ACT IN AERS
2.0000 | INHALATION_SPRAY | Freq: Four times a day (QID) | RESPIRATORY_TRACT | 3 refills | Status: DC | PRN
Start: 2020-04-06 — End: 2020-09-05

## 2020-04-06 NOTE — Telephone Encounter (Signed)
Spoke with patient.  Patient to see Beth at 2:30 pm today after a CXR.  Nothing further needed.

## 2020-04-06 NOTE — Telephone Encounter (Signed)
Called and spoke with pt letting her know the info stated by Aaron Edelman and that we need to get her in for an in-office eval. Pt verbalized understanding. Pt has been scheduled for visit 6/28 with Aaron Edelman. Pt has been informed to go to ED or UC for worsening symptoms. Nothing further needed.

## 2020-04-06 NOTE — Telephone Encounter (Signed)
Patient had a fall last week in the early part of the week, does not remember the exact day.  Since then she has had an increase in cough, little production, no color.  Using proair and nebulizer more often than she should.  Using nebulizer 4x day.  She has oxygen ordered 2L at night, since the fall she has been wearing it during the day as well.  Oxygen level 90% on 2L.  She has not checked a sat on RA.  Denies any fever, wheezing or chest tightness.  She was seen on 6/4 and prescribed an antibiotic for 5 days, prior to the fall she was feeling a little better.  Aaron Edelman please advise.

## 2020-04-06 NOTE — Patient Instructions (Addendum)
Recommendations: Continue Trelegy 1 puff daily (rinse mouth after use) Continue ProAir rescue inhaler 2 puffs every 4-6 hours as needed for breakthrough shortness of breath or wheezing Continue mucinex 1-2 tablets twice daily with water  Use flutter valve 3 times a day followed by coughing exercises Your husband can perform chest physical therapy 3 times a day as well  Orders: Refill ProAir Prednisone taper as directed Levaquin 500 mg 1 tab daily x7 days  Follow-up: 2 to 4 weeks with Eustaquio Maize NP

## 2020-04-06 NOTE — Progress Notes (Signed)
che

## 2020-04-06 NOTE — Telephone Encounter (Signed)
04/06/2020  Patient was last seen on 03/18/2020 by TP NP this was virtually patient was given a Z-Pak as well as prednisone.  Patient is now reporting acute worsening shortness of breath status post a fall.  She needs to follow-up with primary care regarding further evaluation after the fall.  Given her recent treatment of a COPD exacerbation as well as recent virtual visit it seems reasonable that she needs to be evaluated in our office.  I would schedule patient for next available in office evaluation with either Dr. Chase Caller or an APP.  We will also route this message to TP NP to see if she has additional thoughts recommendations as she last spoke with the patient.  If symptoms acutely worsen in the meantime prior to [place] being able to evaluate the patient she needs to seek emergent care at an emergency room or an urgent care.  Wyn Quaker FNP

## 2020-04-06 NOTE — Progress Notes (Signed)
@Patient  ID: [name], female    DOB: 08-Feb-1946, 74 y.o.   MRN: [id number]  Chief Complaint  Patient presents with  . Acute Visit    increase in SOB and cough, esp with activities    Referring provider: Martinique, [name], MD  HPI: 74 yo female former smoker followed for COPD GOLD III and Oxygen dependent Respiratory Failure on Oxygen. Patient of Dr. Chase Caller, last seen by pulmonary NP on 03/18/20 for COPD exacerbation treated with Zpack and prednisone taper.  Previous [facility] pulmonary encounters: 03/18/20- Televisit, TP  Today's televisit is for an acute office visit . Complains of 2 weeks of increased cough,congestion and wheezing .gets winded easily. Breathing has not been doing as well over last 2 months since having Pneumonia in March.  Patient complains that she is now having to wear her oxygen all the time at 2 L.  O2 saturations have been good at 94 to 96%.  She denies any hemoptysis, chest pain, orthopnea, edema.  Appetite is good.  No nausea vomiting or diarrhea.  No loss of taste or smell.  Has received 1 Covid vaccine in March but has not received her second booster.  04/06/2020- interim hx  Patient presents today for acute visit for cough and shortness of breath. She was treated with 5 day course of abx and felt better for a short period of time but doesn't feel she was on antibiotic for long enough. She is taking Trelegy daily, needing to use her nebulizer 3 times a day. She is taking mucinex twice a day. She has not used her flutter valve because she does not feel that it helps. She is wearing 2L oxygen. She has lost 10-15lbs since September.   Imaging: 11/18/18 CT chest wo contrast - Previously seen right lower lobe pulmonary nodules have resolved.These were likely inflammatory. Stable nodular disease along the left major fissure. Aortic Atherosclerosis (ICD10-I70.0). Emphysema (ICD10-J43.9).  12/01/19 CTA- Negative for acute PE or thoracic aortic dissection. Proximal left common  carotid artery stenosis of possible hemodynamic significance. Proximal celiac and SMA occlusions with enlarged IMA collaterals providing the majority of mesenteric circulation, at risk of mesenteric ischemia . Small hiatal hernia. Coronary  and  aortic Atherosclerosis (ICD10-170.0).  Emphysema (ICD10-J43.9).  Allergies  Allergen Reactions  . Sulfa Antibiotics Swelling    Immunization History  Administered Date(s) Administered  . Fluad Quad(high Dose 65+) 08/26/2019  . Influenza, High Dose Seasonal PF 10/03/2017, 06/20/2018, 09/27/2019  . Influenza,inj,Quad PF,6+ Mos 08/10/2014, 08/12/2015, 07/03/2016  . PFIZER SARS-COV-2 Vaccination 01/06/2020  . Pneumococcal Conjugate-13 08/10/2014  . Pneumococcal Polysaccharide-23 08/12/2015  . Td 10/16/2007  . Zoster 11/09/2010    Past Medical History:  Diagnosis Date  . Bilateral leg cramps   . Bladder neoplasm   . Chronic deep vein thrombosis (DVT) of right lower extremity ([place])    05/ 2018  chronic non-occlusive DVT RLE  . COPD, frequent exacerbations ([place])    03-06-2018 per pt last exacerbation 12/ 2018  . Coronary artery disease    cardiologist-  dr Aundra Dubin-- 03-11-2018 er cath , 80% stenosis in the ostial second diagonal  . Dyspnea on minimal exertion   . Emphysema/COPD ([place])    CAT score- 17  . Family history of breast cancer   . Family history of colon cancer   . Family history of melanoma   . Family history of ovarian cancer   . Family history of pancreatic cancer   . Fibromyalgia 1995  . GERD (gastroesophageal reflux disease)   .  Hiatal hernia   . History of breast cancer   . History of diverticulitis of colon    2002  s/p  sigmoid colectomy  . History of multiple pulmonary nodules    hx RUL nodules x2  s/p right VATS w/ wedge resection 09-11-2005 and 06-14-2011  both  necrotizing granulomatous inflammation w/ cystic area of necrosis & focal calcification  . History of right breast cancer oncologist-  dr Jana Hakim-- no  recurrence   dx 2010--  IDC, Stage IA , ER/PR+,  (pT1c pN0) 11-09-2008 right lumpectomy;  12-18-2008 right simple mastectomy for DCIS margins;  completed chemotherapy 2010 (no radiation) and completed antiestrogen therapy  . Hx of colonic polyps    Dr. Cristina Gong -last study '11  . Hyperlipidemia   . Hypertension   . Hypothyroidism   . 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 Nocturia   . OA (osteoarthritis)    thumbs  . On supplemental oxygen therapy    03-06-2018 per pt uses only at night,  checks O2 sats at home,  stated am sat 89% after moving around average 93-94% with RA  . Osteoporosis   . Peripheral arterial occlusive disease [facility]) vascular-- dr chen/ dr Donzetta Matters   proximal right SFA severe focal stenosis with collaterals from the PFA/  04/ 2012 occluded celiac and SMA arteries with distal reconstitution w/ patent IMA  . Peripheral vascular disease ([place])    chronic DVT RLE,  mesenteric vascular disease  . Right lower lobe pulmonary nodule    Chest CT 08-29-2017  . Stage 3 severe COPD by GOLD classification ([place]) hx frequent exacerbations--   pulmologist-  dr Maryann Alar--  per lov note , dated 12-20-2017, oxyogen 2L is prescribed for use with exertion (but pt only uses mostly at night), O2 sats on RA run the 80s , this day sat 86% RA and with 2L O2 sat 92%  . Varicose veins of leg with swelling    varicose vein surgery - Dr. Aleda Grana  . Wears glasses   . Wears hearing aid in both ears     Tobacco History: Social History   Tobacco Use  Smoking Status Former Smoker  . Packs/day: 0.75  . Years: 50.00  . Pack years: 37.50  . Types: Cigarettes  . Quit date: 05/18/2016  . Years since quitting: 3.9  Smokeless Tobacco Never Used   Counseling given: Not Answered   Outpatient Medications Prior to Visit  Medication Sig Dispense Refill  . aspirin EC 81 MG tablet Take 81 mg by mouth daily.    Marland Kitchen atorvastatin (LIPITOR) 80 MG tablet TAKE 1 TABLET BY  MOUTH EVERY DAY 90 tablet 1  . cilostazol (PLETAL) 100 MG tablet Take 1 tablet (100 mg total) by mouth 2 (two) times daily. 60 tablet 11  . dextromethorphan-guaiFENesin (MUCINEX DM) 30-600 MG 12hr tablet Take 1 tablet by mouth 2 (two) times daily.    . Fluticasone-Umeclidin-Vilant (TRELEGY ELLIPTA) 100-62.5-25 MCG/INH AEPB Inhale 1 puff into the lungs daily. 60 each 3  . ipratropium-albuterol (DUONEB) 0.5-2.5 (3) MG/3ML SOLN Inhale 3 mLs into the lungs every 6 (six) hours as needed (sob, wheezing). 360 mL 0  . levothyroxine (SYNTHROID) 112 MCG tablet TAKE 1 TABLET (112 MCG TOTAL) BY MOUTH DAILY BEFORE BREAKFAST. 90 tablet 1  . LORazepam (ATIVAN) 2 MG tablet TAKE 1 TABLET BY MOUTH AT BEDTIME AS NEEDED FOR ANXIETY (Patient taking differently: Take 2 mg by mouth at bedtime. ) 30 tablet 3  .  omeprazole (PRILOSEC) 40 MG capsule TAKE 1 CAPSULE BY MOUTH TWICE A DAY (Patient taking differently: Take 40 mg by mouth 2 (two) times daily. ) 180 capsule 1  . fluticasone (FLONASE) 50 MCG/ACT nasal spray PLACE 1 SPRAY INTO BOTH NOSTRILS 2 (TWO) TIMES DAILY. 16 mL 1  . HYDROcodone-homatropine (HYCODAN) 5-1.5 MG/5ML syrup Take 5 mLs by mouth every 12 (twelve) hours as needed for cough. 100 mL 0  . predniSONE (DELTASONE) 10 MG tablet 4 tabs for 2 days, then 3 tabs for 2 days, 2 tabs for 2 days, then 1 tab for 2 days, then stop 20 tablet 0   Facility-Administered Medications Prior to Visit  Medication Dose Route Frequency Provider Last Rate Last Admin  . albuterol (VENTOLIN HFA) 108 (90 Base) MCG/ACT inhaler 2 puff  2 puff Inhalation Q4H PRN Scot Jun, FNP       Review of Systems  Review of Systems  Constitutional: Positive for unexpected weight change.  Respiratory: Positive for cough, shortness of breath and wheezing.   Cardiovascular: Negative.    Physical Exam  BP 96/62 (BP Location: Left Arm, Cuff Size: Normal)   Pulse 85   Temp (!) 97.3 F (36.3 C) (Oral)   Ht 5\' 6"  (1.676 m)   Wt 134 lb  (60.8 kg)   SpO2 93%   BMI 21.63 kg/m  Physical Exam Constitutional:      Appearance: Normal appearance.  Cardiovascular:     Rate and Rhythm: Normal rate and regular rhythm.  Pulmonary:     Effort: Pulmonary effort is normal.     Breath sounds: Wheezing and rhonchi present.     Comments: Scattered rhonchi t/o, R>L. O2 93% on 2L continuous  Neurological:     Mental Status: She is alert.      Lab Results:  CBC    Component Value Date/Time   WBC 11.4 (H) 02/15/2020 1352   RBC 4.46 02/15/2020 1352   HGB 13.9 02/15/2020 1352   HGB 15.5 01/06/2020 1732   HGB 13.7  1045   HCT 41.7 02/15/2020 1352   HCT 45.6 01/06/2020 1732   HCT 42.4  1045   PLT 334 02/15/2020 1352   PLT 400 01/06/2020 1732   MCV 93.5 02/15/2020 1352   MCV 89 01/06/2020 1732   MCV 87.8  1045   MCH 31.2 02/15/2020 1352   MCHC 33.3 02/15/2020 1352   RDW 14.6 02/15/2020 1352   RDW 12.7 01/06/2020 1732   RDW 14.9 (H)  1045   LYMPHSABS 1.1 01/06/2020 1732   LYMPHSABS 1.1  1045   MONOABS 0.7 11/17/2019 1516   MONOABS 0.7  1045   EOSABS 0.5 (H) 01/06/2020 1732   BASOSABS 0.1 01/06/2020 1732   BASOSABS 0.0  1045    BMET    Component Value Date/Time   NA 137 02/15/2020 1352   NA 135 04/02/2018 1033   NA 141 02/17/2015 1249   K 4.0 02/15/2020 1352   K 4.3 02/17/2015 1249   CL 104 02/15/2020 1352   CL 108 (H) 08/21/2012 1501   CO2 22 02/15/2020 1352   CO2 25 02/17/2015 1249   GLUCOSE 99 02/15/2020 1352   GLUCOSE 85 02/17/2015 1249   GLUCOSE 101 (H) 08/21/2012 1501   BUN 8 02/15/2020 1352   BUN 9 04/02/2018 1033   BUN 16.6 02/17/2015 1249   CREATININE 0.87 02/15/2020 1352   CREATININE 0.85 11/20/2018 1344   CREATININE 0.93 01/03/2018 1601   CREATININE 0.8 02/17/2015 1249  CALCIUM 9.3 02/15/2020 1352   CALCIUM 9.3 02/17/2015 1249   GFRNONAA >60 02/15/2020 1352   GFRNONAA >60 11/20/2018 1344   GFRAA >60 02/15/2020 1352    GFRAA >60 11/20/2018 1344    BNP    Component Value Date/Time   BNP 28.4 07/02/2016 1235    ProBNP No results found for: PROBNP  Imaging: DG Chest 2 View  Result Date: 04/07/2020 CLINICAL DATA:  74 year old female with shortness of breath. EXAM: CHEST - 2 VIEW COMPARISON:  Chest radiograph dated 02/15/2020. FINDINGS: Background of emphysema and chronic interstitial coarsening and bronchitic changes. No focal consolidation, pleural effusion, or pneumothorax. Right upper lobe surgical suture. The cardiac silhouette is within limits. Atherosclerotic calcification of the aorta. No acute osseous pathology. IMPRESSION: 1. No active cardiopulmonary disease. 2. Emphysema. Electronically Signed   By: Anner Crete M.D.   On: 04/07/2020 21:25     Assessment & Plan:   COPD with acute exacerbation ([place]) - Prolonged exacerbation; presents with congested cough, sob and wheezing  - Continue Trelegy 1 puff daily (rinse mouth after use) - Continue ProAir rescue inhaler 2 puffs every 4-6 hours as needed for breakthrough shortness of breath or wheezing - Continue mucinex 1-2 tablets twice daily with water  - Use flutter valve/ chest PT 3 times a day followed by coughing exercises - RX Prednisone taper as directed; Levaquin 500 mg 1 tab daily x7 days - Follow-up: 2 to 4 weeks with Eustaquio Maize NP       Martyn Ehrich, NP 04/25/2020

## 2020-04-06 NOTE — Telephone Encounter (Signed)
04/06/2020   

## 2020-04-08 NOTE — Progress Notes (Signed)
Please let patient know CXR showed no active cardiopulmonary disease- no pneumonia, pleural effusion or pneumothorax. She does have Emphysema and chronic interstitial markings. She had a CTA in February that showed stable scarring or atelectasis in left lung.

## 2020-04-11 ENCOUNTER — Other Ambulatory Visit: Payer: Self-pay

## 2020-04-11 ENCOUNTER — Ambulatory Visit: Payer: Medicare Other | Attending: Family Medicine

## 2020-04-11 ENCOUNTER — Ambulatory Visit: Payer: Medicare Other | Admitting: Pulmonary Disease

## 2020-04-11 DIAGNOSIS — M6281 Muscle weakness (generalized): Secondary | ICD-10-CM | POA: Diagnosis present

## 2020-04-11 DIAGNOSIS — R262 Difficulty in walking, not elsewhere classified: Secondary | ICD-10-CM | POA: Diagnosis present

## 2020-04-11 NOTE — Progress Notes (Signed)
Patient identification verified. Results of recent CXR reviewed.  Per Derl Barrow NP, please let patient know CXR showed no active cardiopulmonary disease- no pneumonia, pleural effusion or pneumothorax. She does have Emphysema and chronic interstitial markings. She had a CTA in February that showed stable scarring or atelectasis in left lung.  Patient verbalized understanding of results.

## 2020-04-11 NOTE — Therapy (Addendum)
[facility] [address] [place], [place], [address] Phone: [phone]   Fax:  [phone]  Physical Therapy Treatment  Patient Details  Name: [name] MRN: [id number] Date of Birth: 1946/04/14 Referring Provider (PT): Martinique, Betty, MD   Encounter Date: 04/11/2020   PT End of Session - 04/11/20 1533    Visit Number 1    Date for PT Re-Evaluation 06/06/20    Authorization Type [employer]    Progress Note Due on Visit 10    PT Start Time 1446    PT Stop Time 1519    PT Time Calculation (min) 33 min    Activity Tolerance Patient tolerated treatment well    Behavior During Therapy [facility] for tasks assessed/performed           Past Medical History:  Diagnosis Date  . Bilateral leg cramps   . Bladder neoplasm   . Chronic deep vein thrombosis (DVT) of right lower extremity ([place])    05/ 2018  chronic non-occlusive DVT RLE  . COPD, frequent exacerbations ([place])    03-06-2018 per pt last exacerbation 12/ 2018  . Coronary artery disease    cardiologist-  dr Aundra Dubin-- 03-11-2018 er cath , 80% stenosis in the ostial second diagonal  . Dyspnea on minimal exertion   . Emphysema/COPD ([place])    CAT score- 17  . Family history of breast cancer   . Family history of colon cancer   . Family history of melanoma   . Family history of ovarian cancer   . Family history of pancreatic cancer   . Fibromyalgia 1995  . GERD (gastroesophageal reflux disease)   . Hiatal hernia   . History of breast cancer   . History of diverticulitis of colon    2002  s/p  sigmoid colectomy  . History of multiple pulmonary nodules    hx RUL nodules x2  s/p right VATS w/ wedge resection 09-11-2005 and 06-14-2011  both  necrotizing granulomatous inflammation w/ cystic area of necrosis & focal calcification  . History of right breast cancer oncologist-  dr Jana Hakim-- no recurrence   dx 2010--  IDC, Stage IA , ER/PR+,  (pT1c pN0) 11-09-2008 right  lumpectomy;  12-18-2008 right simple mastectomy for DCIS margins;  completed chemotherapy 2010 (no radiation) and completed antiestrogen therapy  . Hx of colonic polyps    Dr. Cristina Gong -last study '11  . Hyperlipidemia   . Hypertension   . Hypothyroidism   . 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 Nocturia   . OA (osteoarthritis)    thumbs  . On supplemental oxygen therapy    03-06-2018 per pt uses only at night,  checks O2 sats at home,  stated am sat 89% after moving around average 93-94% with RA  . Osteoporosis   . Peripheral arterial occlusive disease [facility]) vascular-- dr chen/ dr Donzetta Matters   proximal right SFA severe focal stenosis with collaterals from the PFA/  04/ 2012 occluded celiac and SMA arteries with distal reconstitution w/ patent IMA  . Peripheral vascular disease ([place])    chronic DVT RLE,  mesenteric vascular disease  . Right lower lobe pulmonary nodule    Chest CT 08-29-2017  . Stage 3 severe COPD by GOLD classification ([place]) hx frequent exacerbations--   pulmologist-  dr Maryann Alar--  per lov note , dated 12-20-2017, oxyogen 2L is prescribed for use with exertion (but pt only uses mostly at night),  O2 sats on RA run the 80s , this day sat 86% RA and with 2L O2 sat 92%  . Varicose veins of leg with swelling    varicose vein surgery - Dr. Aleda Grana  . Wears glasses   . Wears hearing aid in both ears     Past Surgical History:  Procedure Laterality Date  . BREAST EXCISIONAL BIOPSY Left 10/15/2008  . BREAST LUMPECTOMY W/ NEEDLE LOCALIZATION Right 11-09-2008    dr Brantley Stage   [facility]  . CARDIAC CATHETERIZATION  03-12-2011   dr Aundra Dubin   70-80% ostial stenosis in small second diagonal (appears to small for intervention),  dLAD 40-50%,  minimal luminal irregulartieis involoving LCFx and RCA,  LVEF 55%  . CATARACT EXTRACTION W/ INTRAOCULAR LENS  IMPLANT, BILATERAL  2011  . CYSTOSCOPY N/A 03/11/2018   Procedure: CYSTOSCOPY WITH INSTILLATION OF POST  OPERATIVE EPIRUBICIN;  Surgeon: Festus Aloe, MD;  Location: [facility];  Service: Urology;  Laterality: N/A;  . MASTECTOMY Right   . PARTIAL COLECTOMY  2002   sigmoid and appectomy (diverticulitis)  . PARTIAL MASTECTOMY WITH NEEDLE LOCALIZATION Left 02/23/2013   Procedure:  LEFT PARTIAL MASTECTOMY WITH NEEDLE LOCALIZATION;  Surgeon: Adin Hector, MD;  Location: [facility];  Service: General;  Laterality: Left;  . RECONSTRUCTION BREAST W/ LATISSIMUS DORSI FLAP Right 05-30-2009   dr Harlow Mares  [facility]  . REDUCTION MAMMAPLASTY Left   . SIMPLE MASTECTOMY Right 12-28-2008    dr Brantley Stage  [facility]  . TRANSTHORACIC ECHOCARDIOGRAM  07-09-2016  dr Aundra Dubin   ef 55-60%, grade 1 diastoic dysfunction/  mild TR  . TRANSURETHRAL RESECTION OF BLADDER TUMOR N/A 03/11/2018   Procedure: TRANSURETHRAL RESECTION OF BLADDER TUMOR (TURBT) 2-5cm;  Surgeon: Festus Aloe, MD;  Location: [facility];  Service: Urology;  Laterality: N/A;  . VAGINAL HYSTERECTOMY  1973  . VIDEO ASSISTED THORACOSCOPY (VATS)/WEDGE RESECTION Right 09-11-2005  &  06-14-2011   dr Fara Boros  [facility]   both RUL    There were no vitals filed for this visit.   Subjective Assessment - 04/11/20 1449    Subjective I have been losing my balance and falling for about a year. I have fractured my ribs and bruised many things.  Pt reports that her son passed away about a year ago and this caused her to become immoble.    Pertinent History COPD, hard of hearing, breast cancer.  02 at night    How long can you stand comfortably? dizzy due to BP issues    How long can you walk comfortably? leg pain with walking and limited by shortness of breath.  5-10 minutes is a normal amount of time.    Patient Stated Goals reduce falls              [facility] PT Assessment - 04/11/20 0001      Assessment   Medical Diagnosis falls frequently    Referring Provider (PT) Martinique, Betty, MD    Onset Date/Surgical Date 04/12/19    Prior Therapy none       Precautions   Precautions Fall;Other (comment)    Precaution Comments COPD- shortness of breath      Restrictions   Weight Bearing Restrictions No      Balance Screen   Has the patient fallen in the past 6 months Yes    How many times? 3   PT will treat balance, pt also reports vertigo   Has the patient had a decrease in activity level because of a  fear of falling?  Yes    Is the patient reluctant to leave their home because of a fear of falling?  Yes      [place] Private residence    Living Arrangements Spouse/significant other    Type of [place] to enter    Entrance Stairs-Number of Steps [place] One level    [place] None      Prior Function   Level of [place] Retired    Leisure gardening, house cleaning,  travel- haven't been able to do this for a long time      Charity fundraiser Status Within Functional Limits for tasks assessed      Posture/Postural Control   Posture/Postural Control Postural limitations    Postural Limitations Forward head;Flexed trunk      ROM / Strength   AROM / PROM / Strength AROM;PROM;Strength      AROM   Overall AROM  Within functional limits for tasks performed      PROM   Overall PROM  Within functional limits for tasks performed      Strength   Overall Strength Within functional limits for tasks performed    Overall Strength Comments 4+/5 to 5/5 UE and LE strength-tested in sitting      Transfers   Transfers Sit to Stand;Stand to Sit    Sit to Stand 6: Modified independent (Device/Increase time);With upper extremity assist    Five time sit to stand comments  13.56 seconds- no hands    Stand to Sit With upper extremity assist;6: Modified independent (Device/Increase time)      Ambulation/Gait   Ambulation/Gait Yes    Ambulation/Gait Assistance 6: Modified independent (Device/Increase time)    Ambulation Distance (Feet)  80 Feet    Gait Pattern Step-through pattern;Decreased arm swing - right;Decreased arm swing - left;Trunk flexed;Decreased trunk rotation;Decreased step length - left;Decreased step length - right    Ambulation Surface Level                                 PT Education - 04/11/20 1513    Education Details Access Code: [email or web address]    Person(s) Educated Patient    Methods Explanation;Demonstration;Handout    Comprehension Verbalized understanding;Returned demonstration            PT Short Term Goals - 04/11/20 1527      PT SHORT TERM GOAL #1   Title be independent in advanced HEP    Time 4    Period Weeks    Status New    Target Date 05/09/20      PT SHORT TERM GOAL #2   Title improve LE functional strength to perform sit to stand without UE support and demonstrate neutral hip alignment and controlled descent    Time 4    Period Weeks    Status New    Target Date 05/09/20      PT SHORT TERM GOAL #3   Title improve endurance to walk for 3-5 minutes without LE fatigue    Time 4    Period Weeks    Status New    Target Date 05/09/20             PT Long Term Goals - 04/11/20 1529      PT LONG TERM GOAL #1  Title be independent in advanced HEP    Time 8    Period Weeks    Status New    Target Date 06/06/20      PT LONG TERM GOAL #2   Title perform 5x sit to stand in < or = to 11 seconds with good control    Time 8    Period Weeks    Status New    Target Date 06/06/20      PT LONG TERM GOAL #3   Title improve endurance to stand and walk for 5-10 minutes without LE fatigue    Time 8    Period Weeks    Status New    Target Date 06/06/20      PT LONG TERM GOAL #4   Title ambulate on level surface with normalized step length and trunk rotation due to improved balance    Time 8    Period Weeks    Status New    Target Date 06/06/20                 Plan - 04/11/20 1525    Clinical Impression Statement Pt presents to PT with  frequent falls, deconditioning and weakness over the past year.  Pt's son died a year ago and she reports that she was not very mobile during that time.  She also had an exacerbation of her COPD and that has limited her overall mobility.  Pt demonstrates reduced step length and trunk rotation with gait on level surface and was fatigued after ~80 feet of walking.  PT advised pt to wear a different shoe to PT as she was wearing flip-flops contributing to her shortened step length today.  Pt performed 5x sit to stand in 13.46 seconds with decreased descent in 2/5 reps, indicating a falls risk.   Pt will benefit from skilled PT to address balance, endurance and functional mobility to improve safety and independence at home and in the community.    Personal Factors and Comorbidities Age;Comorbidity 1    Comorbidities COPD    Examination-Activity Limitations Stand;Stairs;Squat;Locomotion Level;Transfers    Examination-Participation Restrictions Community Activity;Meal Prep    Stability/Clinical Decision Making Evolving/Moderate complexity    Clinical Decision Making Moderate    Rehab Potential Good    PT Frequency 2x / week    PT Duration 8 weeks    PT Treatment/Interventions ADLs/Self Care Home Management;Neuromuscular re-education;Balance training;Therapeutic exercise;Patient/family education;Manual techniques;Passive range of motion;Functional mobility training;Stair training;Gait training;Therapeutic activities    PT Next Visit Plan NuStep-monitor O2, review HEP, balance tasks    PT Home Exercise Plan Access Code: [email or web address]    Consulted and Agree with Plan of Care Patient           Patient will benefit from skilled therapeutic intervention in order to improve the following deficits and impairments:  Decreased activity tolerance, Decreased balance, Decreased strength, Postural dysfunction, Decreased endurance, Abnormal gait  Visit Diagnosis: Difficulty in walking, not elsewhere classified - Plan:  PT plan of care cert/re-cert  Muscle weakness (generalized) - Plan: PT plan of care cert/re-cert     Problem List Patient Active Problem List   Diagnosis Date Noted  . Genetic testing 12/12/2018  . Family history of breast cancer   . Family history of ovarian cancer   . Family history of pancreatic cancer   . Family history of colon cancer   . Family history of melanoma   . History of breast cancer   . Chronic respiratory failure with hypoxia (  [place]) 10/14/2017  . COPD with acute exacerbation ([place]) 10/10/2017  . Flu vaccine need 09/09/2017  . Anxiety disorder 09/09/2017  . Fibromyalgia 09/09/2017  . Thrombocytosis ([place]) 06/21/2017  . Malignant neoplasm of overlapping sites of right breast in female, estrogen receptor positive ([place]) 06/05/2017  . Edema of right lower extremity 02/26/2017  . Positive ANA (antinuclear antibody) 02/08/2017  . Elevated IgE level and eosinophilia 12/31/2016  . Oral thrush 11/28/2016  . Rash and nonspecific skin eruption 11/28/2016  . Allergic rhinitis 11/28/2016  . History of bacterial pneumonia 08/09/2016  . Routine general medical examination at a health care facility 08/12/2015  . Insomnia 03/04/2015  . Breast cancer, right breast ([place]) 02/17/2015  . CAD (coronary artery disease) 07/11/2011  . Tobacco abuse 03/27/2011  . Chronic mesenteric ischemia ([place]) 03/27/2011  . Solitary pulmonary nodule 03/27/2011  . PAD (peripheral artery disease) ([place]) 03/04/2011  . Arthropathy 11/09/2010  . Hypothyroidism 09/20/2009  . Hyperlipidemia 09/20/2009  . Hearing loss 09/14/2008  . Essential hypertension 09/08/2007  . COPD, frequent exacerbations ([place]) 09/08/2007  . GERD 09/08/2007    Sigurd Sos, PT 04/11/20 3:34 PM PHYSICAL THERAPY DISCHARGE SUMMARY  Visits from Start of Care: 1   Current functional level related to goals / functional outcomes: Pt called after evaluation and requested D/C at this time.     Remaining deficits: See above     Education / Equipment: See above Plan: Patient agrees to discharge.  Patient goals were not met. Patient is being discharged due to the patient's request.  ?????        Sigurd Sos, PT 04/13/20 9:04 AM  [facility] [address] [place], [place], [address] Phone: [phone]   Fax:  [phone]  Name: [name] MRN: [id number] Date of Birth: 16-Aug-1946

## 2020-04-11 NOTE — Patient Instructions (Signed)
Access Code: [email or web address] URL: [email or web address] Date: 04/11/2020 Prepared by: Claiborne Billings  Exercises Seated Long Arc Quad - 4-5 x daily - 7 x weekly - 1 sets - 10 reps - 5 hold Seated March - 4-5 x daily - 7 x weekly - 1 sets - 10 reps Seated Heel Toe Raises - 4-5 x daily - 7 x weekly - 1 sets - 10 reps Sit to Stand without Arm Support - 3 x daily - 7 x weekly - 2 sets - 5 reps

## 2020-04-14 ENCOUNTER — Other Ambulatory Visit: Payer: Self-pay | Admitting: Family Medicine

## 2020-04-22 ENCOUNTER — Telehealth: Payer: Self-pay | Admitting: Nurse Practitioner

## 2020-04-22 NOTE — Telephone Encounter (Signed)
Left message for patient to call back  

## 2020-04-22 NOTE — Telephone Encounter (Signed)
Pt c/o BP issue: STAT if pt c/o blurred vision, one-sided weakness or slurred speech  1. What are your last 5 BP readings? 89/59, 85/57, 104/71  2. Are you having any other symptoms (ex. Dizziness, headache, blurred vision, passed out)? Dizziness, blurred vision, lightheadedness  3. What is your BP issue? The pt has low BP readings and sx in the morning but the sx resolve themself if in the morning.    Pt is in a Care Management Program with [employer] and Earlie Server, the Nurse who was assigned to the patient today was calling to make [place] of the patient's low BP readings. Nurse advised the patient today about risks of falling when her BP is low and advised the patient to call EMS if sx get worse. The [facility] Nurse also advised the patient to use a cane for walking until her upcoming appointment Tuesday. The patient has an appointment with Dr. Geoffry Paradise on Tuesday 04/26/20. The patient's PCP wanted the patient to see a Doctor for her care instead of just following a NP. The patient prefers Cecille Rubin but the PCP wants the pt to see an MD.    [employer] is hoping our office will reach out to the patient directly but if our office needs to get in touch with [facility] the number to reach them is [phone]    The patient is usually followed by a nurse named Elmyra Ricks but Elmyra Ricks was not working today.

## 2020-04-24 ENCOUNTER — Other Ambulatory Visit: Payer: Self-pay | Admitting: Primary Care

## 2020-04-25 ENCOUNTER — Other Ambulatory Visit: Payer: Self-pay

## 2020-04-25 ENCOUNTER — Encounter: Payer: Self-pay | Admitting: Primary Care

## 2020-04-25 ENCOUNTER — Ambulatory Visit: Payer: Medicare Other | Admitting: Primary Care

## 2020-04-25 DIAGNOSIS — J441 Chronic obstructive pulmonary disease with (acute) exacerbation: Secondary | ICD-10-CM | POA: Diagnosis not present

## 2020-04-25 MED ORDER — TRELEGY ELLIPTA 100-62.5-25 MCG/INH IN AEPB
1.0000 | INHALATION_SPRAY | Freq: Every day | RESPIRATORY_TRACT | 0 refills | Status: DC
Start: 2020-04-25 — End: 2020-04-25

## 2020-04-25 MED ORDER — TRELEGY ELLIPTA 200-62.5-25 MCG/INH IN AEPB
1.0000 | INHALATION_SPRAY | Freq: Every day | RESPIRATORY_TRACT | 0 refills | Status: DC
Start: 2020-04-25 — End: 2020-05-21

## 2020-04-25 MED ORDER — TRELEGY ELLIPTA 200-62.5-25 MCG/INH IN AEPB: 1.0000 | INHALATION_SPRAY | Freq: Every day | RESPIRATORY_TRACT | 5 refills | Status: DC

## 2020-04-25 MED ORDER — IPRATROPIUM-ALBUTEROL 0.5-2.5 (3) MG/3ML IN SOLN: 3.0000 mL | Freq: Four times a day (QID) | RESPIRATORY_TRACT | 6 refills | Status: DC | PRN

## 2020-04-25 NOTE — Progress Notes (Signed)
@Patient  ID: [name], female    DOB: 10-12-1946, 74 y.o.   MRN: [id number]  Chief Complaint  Patient presents with  . Follow-up    Referring provider: [place], [name], MD  HPI: 74 yo female former smoker followed for COPD GOLD III and Oxygen dependent Respiratory Failure on Oxygen. Patient of Dr. Chase Caller, last seen by pulmonary NP on 03/18/20 for COPD exacerbation treated with Zpack and prednisone taper.  Previous [facility] pulmonary encounters: 03/18/20- Televisit, TP  Today's televisit is for an acute office visit . Complains of 2 weeks of increased cough,congestion and wheezing .gets winded easily. Breathing has not been doing as well over last 2 months since having Pneumonia in March.  Patient complains that she is now having to wear her oxygen all the time at 2 L.  O2 saturations have been good at 94 to 96%.  She denies any hemoptysis, chest pain, orthopnea, edema.  Appetite is good.  No nausea vomiting or diarrhea.  No loss of taste or smell.  Has received 1 Covid vaccine in March but has not received her second booster.  04/06/2020  Patient presents today for acute visit for cough and shortness of breath. She was treated with 5 day course of abx and felt better for a short period of time but doesn't feel she was on antibiotic for long enough. She is taking Trelegy daily, needing to use her nebulizer 3 times a day. She is taking mucinex twice a day. She has not used her flutter valve because she does not feel that it helps. She is wearing 2L oxygen. She has lost 10-15lbs since September.   04/25/2020- interim hx Breathing is a little better. Feels like she can take a deeper breath. Still experience dyspnea with activity and complains of some chest congestion. She is able to cough up more mucus. Sputum is beige. She finished levaquin course and prednisone. She takes mucinex twice and uses the flutter valve daily. Her nose runs, she blows her nose. She uses her breathing treatments three times  a day. Nocutrnal symptoms are better.  She has had four COPD flares this year.   Imaging: 11/18/18 CT chest wo contrast - Previously seen right lower lobe pulmonary nodules have resolved.These were likely inflammatory. Stable nodular disease along the left major fissure. Aortic Atherosclerosis (ICD10-I70.0). Emphysema (ICD10-J43.9).  12/01/19 CTA- Negative for acute PE or thoracic aortic dissection. Proximal left common carotid artery stenosis of possible hemodynamic significance. Proximal celiac and SMA occlusions with enlarged IMA collaterals providing the majority of mesenteric circulation, at risk of mesenteric ischemia . Small hiatal hernia. Coronary  and  aortic Atherosclerosis (ICD10-170.0).  Emphysema (ICD10-J43.9).  Pulmonary Function testing: 10/26/16- FVC 2.47 (81%), FEV1 1.03 (44%), ratio 47 +BD Severe obstruction lung disease with reversibility  Allergies  Allergen Reactions  . Sulfa Antibiotics Swelling    Immunization History  Administered Date(s) Administered  . Fluad Quad(high Dose 65+) 08/26/2019  . Influenza, High Dose Seasonal PF 10/03/2017, 06/20/2018, 09/27/2019  . Influenza,inj,Quad PF,6+ Mos 08/10/2014, 08/12/2015, 07/03/2016  . PFIZER SARS-COV-2 Vaccination 01/06/2020  . Pneumococcal Conjugate-13 08/10/2014  . Pneumococcal Polysaccharide-23 08/12/2015  . Td 10/16/2007  . Zoster 11/09/2010    Past Medical History:  Diagnosis Date  . Bilateral leg cramps   . Bladder neoplasm   . 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 COPD, frequent exacerbations ([place])    03-06-2018 per pt last exacerbation  12/ 2018  . Coronary artery disease    cardiologist-  dr Aundra Dubin-- 03-11-2018 er cath , 80% stenosis in the ostial second diagonal  . Dyspnea on minimal exertion   . Emphysema/COPD ([place])    CAT score- 17  . Family history of breast cancer   . Family history of colon cancer   . Family history of  melanoma   . Family history of ovarian cancer   . Family history of pancreatic cancer   . Fibromyalgia 1995  . GERD (gastroesophageal reflux disease)   . Hiatal hernia   . History of breast cancer   . History of diverticulitis of colon    2002  s/p  sigmoid colectomy  . History of multiple pulmonary nodules    hx RUL nodules x2  s/p right VATS w/ wedge resection 09-11-2005 and 06-14-2011  both  necrotizing granulomatous inflammation w/ cystic area of necrosis & focal calcification  . History of right breast cancer oncologist-  dr Jana Hakim-- no recurrence   dx 2010--  IDC, Stage IA , ER/PR+,  (pT1c pN0) 11-09-2008 right lumpectomy;  12-18-2008 right simple mastectomy for DCIS margins;  completed chemotherapy 2010 (no radiation) and completed antiestrogen therapy  . Hx of colonic polyps    Dr. Cristina Gong -last study '11  . Hyperlipidemia   . Hypertension   . Hypothyroidism   . 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 Nocturia   . OA (osteoarthritis)    thumbs  . On supplemental oxygen therapy    03-06-2018 per pt uses only at night,  checks O2 sats at home,  stated am sat 89% after moving around average 93-94% with RA  . Osteoporosis   . Peripheral arterial occlusive disease [facility]) vascular-- dr chen/ dr Donzetta Matters   proximal right SFA severe focal stenosis with collaterals from the PFA/  04/ 2012 occluded celiac and SMA arteries with distal reconstitution w/ patent IMA  . Peripheral vascular disease ([place])    chronic DVT RLE,  mesenteric vascular disease  . Right lower lobe pulmonary nodule    Chest CT 08-29-2017  . Stage 3 severe COPD by GOLD classification ([place]) hx frequent exacerbations--   pulmologist-  dr Maryann Alar--  per lov note , dated 12-20-2017, oxyogen 2L is prescribed for use with exertion (but pt only uses mostly at night), O2 sats on RA run the 80s , this day sat 86% RA and with 2L O2 sat 92%  . Varicose veins of leg with swelling    varicose vein  surgery - Dr. Aleda Grana  . Wears glasses   . Wears hearing aid in both ears     Tobacco History: Social History   Tobacco Use  Smoking Status Former Smoker  . Packs/day: 0.75  . Years: 50.00  . Pack years: 37.50  . Types: Cigarettes  . Quit date: 05/18/2016  . Years since quitting: 3.9  Smokeless Tobacco Never Used   Counseling given: Not Answered   Outpatient Medications Prior to Visit  Medication Sig Dispense Refill  . albuterol (VENTOLIN HFA) 108 (90 Base) MCG/ACT inhaler Inhale 2 puffs into the lungs every 6 (six) hours as needed for wheezing or shortness of breath. 28 g 3  . aspirin EC 81 MG tablet Take 81 mg by mouth daily.    Marland Kitchen atorvastatin (LIPITOR) 80 MG tablet TAKE 1 TABLET BY MOUTH EVERY DAY 90 tablet 1  . cilostazol (PLETAL) 100 MG tablet Take 1 tablet (100 mg total) by  mouth 2 (two) times daily. 60 tablet 11  . dextromethorphan-guaiFENesin (MUCINEX DM) 30-600 MG 12hr tablet Take 1 tablet by mouth 2 (two) times daily.    . fluticasone (FLONASE) 50 MCG/ACT nasal spray SPRAY 1 SPRAY INTO EACH NOSTRIL TWICE A DAY 16 mL 3  . levothyroxine (SYNTHROID) 112 MCG tablet TAKE 1 TABLET (112 MCG TOTAL) BY MOUTH DAILY BEFORE BREAKFAST. 90 tablet 1  . LORazepam (ATIVAN) 2 MG tablet TAKE 1 TABLET BY MOUTH AT BEDTIME AS NEEDED FOR ANXIETY (Patient taking differently: Take 2 mg by mouth at bedtime. ) 30 tablet 3  . omeprazole (PRILOSEC) 40 MG capsule TAKE 1 CAPSULE BY MOUTH TWICE A DAY (Patient taking differently: Take 40 mg by mouth 2 (two) times daily. ) 180 capsule 1  . ipratropium-albuterol (DUONEB) 0.5-2.5 (3) MG/3ML SOLN Inhale 3 mLs into the lungs every 6 (six) hours as needed (sob, wheezing). 360 mL 0  . predniSONE (DELTASONE) 10 MG tablet Take 4 tabs po daily x 3 days; then 3 tabs daily x3 days; then 2 tabs daily x3 days; then 1 tab daily x 3 days; then stop 30 tablet 0  . TRELEGY ELLIPTA 100-62.5-25 MCG/INH AEPB TAKE 1 PUFF BY MOUTH EVERY DAY 60 each 3  . levofloxacin  (LEVAQUIN) 500 MG tablet Take 1 tablet (500 mg total) by mouth daily. (Patient not taking: Reported on 04/25/2020) 7 tablet 0  . albuterol (VENTOLIN HFA) 108 (90 Base) MCG/ACT inhaler 2 puff      No facility-administered medications prior to visit.    Review of Systems  Review of Systems  Respiratory: Positive for cough and shortness of breath.    Physical Exam  BP 100/66 (BP Location: Left Arm, Patient Position: Sitting, Cuff Size: Normal)   Pulse 65   Temp 97.9 F (36.6 C) (Oral)   Ht 5\' 6"  (1.676 m)   Wt 139 lb 6.4 oz (63.2 kg)   SpO2 95%   BMI 22.50 kg/m  Physical Exam Constitutional:      Appearance: Normal appearance.  HENT:     Head: Normocephalic and atraumatic.     Mouth/Throat:     Mouth: Mucous membranes are moist.     Pharynx: Oropharynx is clear.  Cardiovascular:     Rate and Rhythm: Normal rate and regular rhythm.  Pulmonary:     Effort: Pulmonary effort is normal.     Breath sounds: Wheezing and rhonchi present.  Musculoskeletal:        General: Normal range of motion.  Neurological:     General: No focal deficit present.     Mental Status: She is alert and oriented to person, place, and time. Mental status is at baseline.  Psychiatric:        Mood and Affect: Mood normal.        Behavior: Behavior normal.        Thought Content: Thought content normal.        Judgment: Judgment normal.      Lab Results:  CBC    Component Value Date/Time   WBC 11.4 (H) 02/15/2020 1352   RBC 4.46 02/15/2020 1352   HGB 13.9 02/15/2020 1352   HGB 15.5 01/06/2020 1732   HGB 13.7  1045   HCT 41.7 02/15/2020 1352   HCT 45.6 01/06/2020 1732   HCT 42.4  1045   PLT 334 02/15/2020 1352   PLT 400 01/06/2020 1732   MCV 93.5 02/15/2020 1352   MCV 89 01/06/2020 1732   MCV 87.8   [place] 31.2 02/15/2020 1352   MCHC 33.3 02/15/2020 1352   RDW 14.6 02/15/2020 1352   RDW 12.7 01/06/2020 1732   RDW 14.9 (H)  1045   LYMPHSABS  1.1 01/06/2020 1732   LYMPHSABS 1.1  1045   MONOABS 0.7 11/17/2019 1516   MONOABS 0.7  1045   EOSABS 0.5 (H) 01/06/2020 1732   BASOSABS 0.1 01/06/2020 1732   BASOSABS 0.0  1045    BMET    Component Value Date/Time   NA 137 02/15/2020 1352   NA 135 04/02/2018 1033   NA 141 02/17/2015 1249   K 4.0 02/15/2020 1352   K 4.3 02/17/2015 1249   CL 104 02/15/2020 1352   CL 108 (H) 08/21/2012 1501   CO2 22 02/15/2020 1352   CO2 25 02/17/2015 1249   GLUCOSE 99 02/15/2020 1352   GLUCOSE 85 02/17/2015 1249   GLUCOSE 101 (H) 08/21/2012 1501   BUN 8 02/15/2020 1352   BUN 9 04/02/2018 1033   BUN 16.6 02/17/2015 1249   CREATININE 0.87 02/15/2020 1352   CREATININE 0.85 11/20/2018 1344   CREATININE 0.93 01/03/2018 1601   CREATININE 0.8 02/17/2015 1249   CALCIUM 9.3 02/15/2020 1352   CALCIUM 9.3 02/17/2015 1249   GFRNONAA >60 02/15/2020 1352   GFRNONAA >60 11/20/2018 1344   GFRAA >60 02/15/2020 1352   GFRAA >60 11/20/2018 1344    BNP    Component Value Date/Time   BNP 28.4 07/02/2016 1235    ProBNP No results found for: PROBNP  Imaging: DG Chest 2 View  Result Date: 04/07/2020 CLINICAL DATA:  74 year old female with shortness of breath. EXAM: CHEST - 2 VIEW COMPARISON:  Chest radiograph dated 02/15/2020. FINDINGS: Background of emphysema and chronic interstitial coarsening and bronchitic changes. No focal consolidation, pleural effusion, or pneumothorax. Right upper lobe surgical suture. The cardiac silhouette is within limits. Atherosclerotic calcification of the aorta. No acute osseous pathology. IMPRESSION: 1. No active cardiopulmonary disease. 2. Emphysema. Electronically Signed   By: Anner Crete M.D.   On: 04/07/2020 21:25     Assessment & Plan:   COPD, frequent exacerbations ([place]) - Mixed COPD with frequent exacerbations. Pulmonary function testing has showed significant BD response in the past and eosinophil absolute was 500 in March  2021 - Trial Trelegy 200- one puff daily in the morning (sample given and prescription sent) - Adding Singulair 10mg  at bedtime  - Refill ipratropium nebulizer q6 hours for breakthrough shortness of breath or wheezing - Consider Daliresp if continues to have AECOPD - Follow-up:8 weeks with Eustaquio Maize NP or MR      Martyn Ehrich, NP 04/25/2020

## 2020-04-25 NOTE — Patient Instructions (Addendum)
Rx: Singulair 10mg  at bedtime every day (this is for asthma.allergies) Trial Trelegy 200- one puff daily in the morning (sample given and prescription sent) Refill Albuterol nebulizer   Follow-up: 8 weeks with Beth NP or MR   Montelukast oral tablets What is this medicine? MONTELUKAST (mon te LOO kast) is used to prevent and treat the symptoms of asthma. It is also used to treat allergies. Do not use for an acute asthma attack. This medicine may be used for other purposes; ask your health care provider or pharmacist if you have questions. COMMON BRAND NAME(S): Singulair What should I tell my health care provider before I take this medicine? They need to know if you have any of these conditions:  liver disease  an unusual or allergic reaction to montelukast, other medicines, foods, dyes, or preservatives  pregnant or trying to get pregnant  breast-feeding How should I use this medicine? This medicine should be given by mouth. Follow the directions on the prescription label. Take this medicine at the same time every day. You may take this medicine with or without meals. Do not chew the tablets. Do not stop taking your medicine unless your doctor tells you to. Talk to your pediatrician regarding the use of this medicine in children. Special care may be needed. While this drug may be prescribed for children as young as 33 years of age for selected conditions, precautions do apply. Overdosage: If you think you have taken too much of this medicine contact a poison control center or emergency room at once. NOTE: This medicine is only for you. Do not share this medicine with others. What if I miss a dose? If you miss a dose, skip it. Take your next dose at the normal time. Do not take extra or 2 doses at the same time to make up for the missed dose. What may interact with this medicine?  anti-infectives like rifampin and rifabutin  medicines for seizures like phenytoin, phenobarbital, and  carbamazepine This list may not describe all possible interactions. Give your health care provider a list of all the medicines, herbs, non-prescription drugs, or dietary supplements you use. Also tell them if you smoke, drink alcohol, or use illegal drugs. Some items may interact with your medicine. What should I watch for while using this medicine? Visit your doctor or health care professional for regular checks on your progress. Tell your doctor or health care professional if your allergy or asthma symptoms do not improve. Take your medicine even when you do not have symptoms. Do not stop taking any of your medicine(s) unless your doctor tells you to. If you have asthma, talk to your doctor about what to do in an acute asthma attack. Always have your inhaled rescue medicine for asthma attacks with you. Patients and their families should watch for new or worsening thoughts of suicide or depression. Also watch for sudden changes in feelings such as feeling anxious, agitated, panicky, irritable, hostile, aggressive, impulsive, severely restless, overly excited and hyperactive, or not being able to sleep. Any worsening of mood or thoughts of suicide or dying should be reported to your health care professional right away. What side effects may I notice from receiving this medicine? Side effects that you should report to your doctor or health care professional as soon as possible:  allergic reactions like skin rash or hives, or swelling of the face, lips, or tongue  breathing problems  changes in emotions or moods  confusion  depressed mood  fever or  infection  hallucinations  joint pain  painful lumps under the skin  pain, tingling, numbness in the hands or feet  redness, blistering, peeling, or loosening of the skin, including inside the mouth  restlessness  seizures  sleep walking  signs and symptoms of infection like fever; chills; cough; sore throat; flu-like illness  signs  and symptoms of liver injury like dark yellow or brown urine; general ill feeling or flu-like symptoms; light-colored stools; loss of appetite; nausea; right upper belly pain; unusually weak or tired; yellowing of the eyes or skin  sinus pain or swelling  stuttering  suicidal thoughts or other mood changes  tremors  trouble sleeping  uncontrolled muscle movements  unusual bleeding or bruising  vivid or bad dreams Side effects that usually do not require medical attention (report to your doctor or health care professional if they continue or are bothersome):  dizziness  drowsiness  headache  runny nose  stomach upset  tiredness This list may not describe all possible side effects. Call your doctor for medical advice about side effects. You may report side effects to FDA at 1-800-FDA-1088. Where should I keep my medicine? Keep out of the reach of children. Store at room temperature between 15 and 30 degrees C (59 and 86 degrees F). Protect from light and moisture. Keep this medicine in the original bottle. Throw away any unused medicine after the expiration date. NOTE: This sheet is a summary. It may not cover all possible information. If you have questions about this medicine, talk to your doctor, pharmacist, or health care provider.  2020 Elsevier/Gold Standard (2019-01-30 12:54:33)

## 2020-04-25 NOTE — Assessment & Plan Note (Addendum)
-   Mixed COPD with frequent exacerbations. Pulmonary function testing has showed significant BD response in the past and eosinophil absolute was 500 in March 2021 - Trial Trelegy 200- one puff daily in the morning (sample given and prescription sent) - Adding Singulair 10mg  at bedtime  - Refill ipratropium nebulizer q6 hours for breakthrough shortness of breath or wheezing - Consider Daliresp if continues to have AECOPD - Follow-up:8 weeks with Eustaquio Maize NP or MR

## 2020-04-25 NOTE — Assessment & Plan Note (Signed)
-   Prolonged exacerbation; presents with congested cough, sob and wheezing  - Continue Trelegy 1 puff daily (rinse mouth after use) - Continue ProAir rescue inhaler 2 puffs every 4-6 hours as needed for breakthrough shortness of breath or wheezing - Continue mucinex 1-2 tablets twice daily with water  - Use flutter valve/ chest PT 3 times a day followed by coughing exercises - RX Prednisone taper as directed; Levaquin 500 mg 1 tab daily x7 days - Follow-up: 2 to 4 weeks with Eustaquio Maize NP

## 2020-04-26 NOTE — Telephone Encounter (Signed)
Carly,   Can you please send my last OV note to Dr. Geoffry Paradise as FYI please.   Patient's noted to have had marked difference in BP between left and right arms. Has extensive vascular disease - with not much in the way of options - sees Dr. Donzetta Matters.   I am happy to see her back sooner than the new appointment she cannot get for another month if needed.    [name], RN, [facility] [address] [place], [place]  [address] [phone]

## 2020-04-26 NOTE — Telephone Encounter (Signed)
Left message for patient to call back  

## 2020-04-29 ENCOUNTER — Telehealth: Payer: Self-pay | Admitting: Primary Care

## 2020-04-29 MED ORDER — MONTELUKAST SODIUM 10 MG PO TABS: 10.0000 mg | ORAL_TABLET | Freq: Every day | ORAL | 3 refills | Status: DC

## 2020-04-29 NOTE — Telephone Encounter (Signed)
Called and spoke with pt who stated the Rx Beth wanted her to take for asthma was not sent to pharmacy. After reviewing pt's AVS, noticed that montelukast was not sent to pharmacy. Asked pt to verbalize Rx that she did receive from the pharmacy and after clarifying that with pt, I have sent montelukast to preferred pharmacy for pt. Nothing further needed.

## 2020-05-11 ENCOUNTER — Encounter: Payer: Medicare Other | Admitting: Physical Therapy

## 2020-05-11 ENCOUNTER — Telehealth: Payer: Self-pay

## 2020-05-11 NOTE — Telephone Encounter (Signed)
LMTCB

## 2020-05-11 NOTE — Telephone Encounter (Signed)
-----   Message from [name], NP sent at 05/11/2020  7:24 AM EDT ----- Can you please let her know that I received notification that her Chantix has been recalled by the company and that she should not be taking (and was an old RX) - she should notify her pharmacy.   [name]

## 2020-05-12 NOTE — Telephone Encounter (Signed)
S/w pt's husband per (DPR)  Is aware chantix has been recalled and to call pharmacy.  Husband did not know  if pt was still taking this medication.  Pt is still smoking.

## 2020-05-13 ENCOUNTER — Telehealth: Payer: Self-pay | Admitting: *Deleted

## 2020-05-13 NOTE — Telephone Encounter (Signed)
Elmyra Ricks, nurse from [employer] called to report patient's blood pressure 93/60. Elmyra Ricks reported that patient stated that she is still a little dizzy and lightheaded in the mornings and that she still has a cough that is getting better.

## 2020-05-16 ENCOUNTER — Telehealth: Payer: Self-pay | Admitting: Primary Care

## 2020-05-16 MED ORDER — PREDNISONE 10 MG PO TABS
ORAL_TABLET | ORAL | 0 refills | Status: DC
Start: 2020-05-16 — End: 2020-05-21

## 2020-05-16 MED ORDER — AZITHROMYCIN 250 MG PO TABS
ORAL_TABLET | ORAL | 0 refills | Status: DC
Start: 2020-05-16 — End: 2020-05-21

## 2020-05-16 NOTE — Telephone Encounter (Signed)
Can try zpak and Prednisone 10 mg take  4 each am x 2 days,   2 each am x 2 days,  1 each am x 2 days and stop but if any worse ie can't get comfortable after neb at rest will need to go to er

## 2020-05-16 NOTE — Telephone Encounter (Signed)
Spoke with pt and informed her z-pak and prednisone will be called in for her and instructed pt if not feeling any better after nebulizer and rest to proceed to ER. Pt stated understanding. Nothing further needed at this time.

## 2020-05-16 NOTE — Telephone Encounter (Signed)
Spoke with pt who states cough and SOB has gotten progressively worse over last week. Pt states she becomes short of breath with any extreration. Pt states she is coughing up yellow mucus.  Pt states she uses Mucinex, Flonase, Singulair and Trelegy daily. Pt states she can not breathe well while laying down and has been sleeping in chair for last couple of nights. Pt states she is having use albuterol nebulizer 4 times a day including during the middle of the night and Proair inhaler 3-4 times a day. Pt current saturation is 90% on 2L of O2. Pt has not been exsposed to anyone with Covid. Pt has had first [place] vaccination in March 2021. Pt states no fever, no N/V/D. I have have reviewed all provider schedule's and no one has any openings this week.

## 2020-05-18 ENCOUNTER — Encounter: Payer: Medicare Other | Admitting: Physical Therapy

## 2020-05-20 ENCOUNTER — Other Ambulatory Visit: Payer: Self-pay

## 2020-05-20 ENCOUNTER — Encounter (HOSPITAL_COMMUNITY): Payer: Self-pay | Admitting: Emergency Medicine

## 2020-05-20 ENCOUNTER — Telehealth: Payer: Self-pay | Admitting: Internal Medicine

## 2020-05-20 ENCOUNTER — Emergency Department (HOSPITAL_COMMUNITY): Payer: Medicare Other

## 2020-05-20 ENCOUNTER — Ambulatory Visit: Payer: Medicare Other | Admitting: Pulmonary Disease

## 2020-05-20 ENCOUNTER — Inpatient Hospital Stay (HOSPITAL_COMMUNITY)
Admission: EM | Admit: 2020-05-20 | Discharge: 2020-05-29 | DRG: 286 | Disposition: A | Discharge status: Home Health | Payer: Medicare Other | Attending: Internal Medicine | Admitting: Internal Medicine

## 2020-05-20 DIAGNOSIS — K5790 Diverticulosis of intestine, part unspecified, without perforation or abscess without bleeding: Secondary | ICD-10-CM | POA: Diagnosis present

## 2020-05-20 DIAGNOSIS — I10 Essential (primary) hypertension: Secondary | ICD-10-CM

## 2020-05-20 DIAGNOSIS — K219 Gastro-esophageal reflux disease without esophagitis: Secondary | ICD-10-CM | POA: Diagnosis present

## 2020-05-20 DIAGNOSIS — I110 Hypertensive heart disease with heart failure: Secondary | ICD-10-CM | POA: Diagnosis present

## 2020-05-20 DIAGNOSIS — K551 Chronic vascular disorders of intestine: Secondary | ICD-10-CM | POA: Diagnosis present

## 2020-05-20 DIAGNOSIS — Z825 Family history of asthma and other chronic lower respiratory diseases: Secondary | ICD-10-CM

## 2020-05-20 DIAGNOSIS — Z853 Personal history of malignant neoplasm of breast: Secondary | ICD-10-CM

## 2020-05-20 DIAGNOSIS — M797 Fibromyalgia: Secondary | ICD-10-CM | POA: Diagnosis present

## 2020-05-20 DIAGNOSIS — E785 Hyperlipidemia, unspecified: Secondary | ICD-10-CM | POA: Diagnosis present

## 2020-05-20 DIAGNOSIS — Z9013 Acquired absence of bilateral breasts and nipples: Secondary | ICD-10-CM

## 2020-05-20 DIAGNOSIS — J439 Emphysema, unspecified: Secondary | ICD-10-CM | POA: Diagnosis present

## 2020-05-20 DIAGNOSIS — F419 Anxiety disorder, unspecified: Secondary | ICD-10-CM | POA: Diagnosis present

## 2020-05-20 DIAGNOSIS — I5033 Acute on chronic diastolic (congestive) heart failure: Secondary | ICD-10-CM | POA: Diagnosis not present

## 2020-05-20 DIAGNOSIS — Z170 Estrogen receptor positive status [ER+]: Secondary | ICD-10-CM

## 2020-05-20 DIAGNOSIS — R1310 Dysphagia, unspecified: Secondary | ICD-10-CM

## 2020-05-20 DIAGNOSIS — Z79899 Other long term (current) drug therapy: Secondary | ICD-10-CM

## 2020-05-20 DIAGNOSIS — F321 Major depressive disorder, single episode, moderate: Secondary | ICD-10-CM | POA: Diagnosis present

## 2020-05-20 DIAGNOSIS — J9621 Acute and chronic respiratory failure with hypoxia: Secondary | ICD-10-CM | POA: Diagnosis present

## 2020-05-20 DIAGNOSIS — Z20822 Contact with and (suspected) exposure to covid-19: Secondary | ICD-10-CM | POA: Diagnosis present

## 2020-05-20 DIAGNOSIS — M810 Age-related osteoporosis without current pathological fracture: Secondary | ICD-10-CM | POA: Diagnosis present

## 2020-05-20 DIAGNOSIS — F4321 Adjustment disorder with depressed mood: Secondary | ICD-10-CM | POA: Diagnosis present

## 2020-05-20 DIAGNOSIS — I2510 Atherosclerotic heart disease of native coronary artery without angina pectoris: Secondary | ICD-10-CM | POA: Diagnosis present

## 2020-05-20 DIAGNOSIS — J810 Acute pulmonary edema: Secondary | ICD-10-CM | POA: Diagnosis not present

## 2020-05-20 DIAGNOSIS — I501 Left ventricular failure: Secondary | ICD-10-CM | POA: Diagnosis not present

## 2020-05-20 DIAGNOSIS — I5023 Acute on chronic systolic (congestive) heart failure: Secondary | ICD-10-CM | POA: Diagnosis present

## 2020-05-20 DIAGNOSIS — Z79890 Hormone replacement therapy (postmenopausal): Secondary | ICD-10-CM

## 2020-05-20 DIAGNOSIS — Z87891 Personal history of nicotine dependence: Secondary | ICD-10-CM

## 2020-05-20 DIAGNOSIS — M19041 Primary osteoarthritis, right hand: Secondary | ICD-10-CM | POA: Diagnosis present

## 2020-05-20 DIAGNOSIS — Z9981 Dependence on supplemental oxygen: Secondary | ICD-10-CM | POA: Diagnosis not present

## 2020-05-20 DIAGNOSIS — C50911 Malignant neoplasm of unspecified site of right female breast: Secondary | ICD-10-CM | POA: Diagnosis present

## 2020-05-20 DIAGNOSIS — Z8551 Personal history of malignant neoplasm of bladder: Secondary | ICD-10-CM

## 2020-05-20 DIAGNOSIS — J441 Chronic obstructive pulmonary disease with (acute) exacerbation: Secondary | ICD-10-CM | POA: Diagnosis not present

## 2020-05-20 DIAGNOSIS — K224 Dyskinesia of esophagus: Secondary | ICD-10-CM | POA: Diagnosis present

## 2020-05-20 DIAGNOSIS — R0982 Postnasal drip: Secondary | ICD-10-CM | POA: Diagnosis not present

## 2020-05-20 DIAGNOSIS — I25119 Atherosclerotic heart disease of native coronary artery with unspecified angina pectoris: Secondary | ICD-10-CM | POA: Diagnosis not present

## 2020-05-20 DIAGNOSIS — I739 Peripheral vascular disease, unspecified: Secondary | ICD-10-CM | POA: Diagnosis present

## 2020-05-20 DIAGNOSIS — M19042 Primary osteoarthritis, left hand: Secondary | ICD-10-CM | POA: Diagnosis present

## 2020-05-20 DIAGNOSIS — Z882 Allergy status to sulfonamides status: Secondary | ICD-10-CM

## 2020-05-20 DIAGNOSIS — H9190 Unspecified hearing loss, unspecified ear: Secondary | ICD-10-CM | POA: Diagnosis present

## 2020-05-20 DIAGNOSIS — Z8249 Family history of ischemic heart disease and other diseases of the circulatory system: Secondary | ICD-10-CM

## 2020-05-20 DIAGNOSIS — Z803 Family history of malignant neoplasm of breast: Secondary | ICD-10-CM

## 2020-05-20 DIAGNOSIS — Z7982 Long term (current) use of aspirin: Secondary | ICD-10-CM | POA: Diagnosis not present

## 2020-05-20 DIAGNOSIS — J90 Pleural effusion, not elsewhere classified: Secondary | ICD-10-CM | POA: Diagnosis not present

## 2020-05-20 DIAGNOSIS — Z86718 Personal history of other venous thrombosis and embolism: Secondary | ICD-10-CM

## 2020-05-20 DIAGNOSIS — R0602 Shortness of breath: Secondary | ICD-10-CM | POA: Diagnosis present

## 2020-05-20 DIAGNOSIS — I342 Nonrheumatic mitral (valve) stenosis: Secondary | ICD-10-CM | POA: Diagnosis not present

## 2020-05-20 DIAGNOSIS — E7800 Pure hypercholesterolemia, unspecified: Secondary | ICD-10-CM

## 2020-05-20 DIAGNOSIS — E039 Hypothyroidism, unspecified: Secondary | ICD-10-CM | POA: Diagnosis present

## 2020-05-20 DIAGNOSIS — Z974 Presence of external hearing-aid: Secondary | ICD-10-CM

## 2020-05-20 DIAGNOSIS — I5021 Acute systolic (congestive) heart failure: Secondary | ICD-10-CM | POA: Diagnosis not present

## 2020-05-20 DIAGNOSIS — Z9049 Acquired absence of other specified parts of digestive tract: Secondary | ICD-10-CM

## 2020-05-20 DIAGNOSIS — Z7902 Long term (current) use of antithrombotics/antiplatelets: Secondary | ICD-10-CM

## 2020-05-20 LAB — CBC WITH DIFFERENTIAL/PLATELET
Abs Immature Granulocytes: 0.05 10*3/uL (ref 0.00–0.07)
Basophils Absolute: 0 10*3/uL (ref 0.0–0.1)
Basophils Relative: 1 %
Eosinophils Absolute: 0 10*3/uL (ref 0.0–0.5)
Eosinophils Relative: 0 %
HCT: 44.5 % (ref 36.0–46.0)
Hemoglobin: 14.7 g/dL (ref 12.0–15.0)
Immature Granulocytes: 1 %
Lymphocytes Relative: 6 %
Lymphs Abs: 0.5 10*3/uL — ABNORMAL LOW (ref 0.7–4.0)
MCH: 30.6 pg (ref 26.0–34.0)
MCHC: 33 g/dL (ref 30.0–36.0)
MCV: 92.7 fL (ref 80.0–100.0)
Monocytes Absolute: 0.2 10*3/uL (ref 0.1–1.0)
Monocytes Relative: 3 %
Neutro Abs: 6.9 10*3/uL (ref 1.7–7.7)
Neutrophils Relative %: 89 %
Platelets: 430 10*3/uL — ABNORMAL HIGH (ref 150–400)
RBC: 4.8 MIL/uL (ref 3.87–5.11)
RDW: 14.1 % (ref 11.5–15.5)
WBC: 7.7 10*3/uL (ref 4.0–10.5)
nRBC: 0 % (ref 0.0–0.2)

## 2020-05-20 LAB — BASIC METABOLIC PANEL
Anion gap: 12 (ref 5–15)
BUN: 16 mg/dL (ref 8–23)
CO2: 26 mmol/L (ref 22–32)
Calcium: 9.4 mg/dL (ref 8.9–10.3)
Chloride: 103 mmol/L (ref 98–111)
Creatinine, Ser: 0.8 mg/dL (ref 0.44–1.00)
GFR calc Af Amer: >60 mL/min (ref >60)
GFR calc non Af Amer: >60 mL/min (ref >60)
Glucose, Bld: 123 mg/dL — ABNORMAL HIGH (ref 70–99)
Potassium: 4.6 mmol/L (ref 3.5–5.1)
Sodium: 141 mmol/L (ref 135–145)

## 2020-05-20 LAB — SARS CORONAVIRUS 2 BY RT PCR (HOSPITAL ORDER, PERFORMED IN ~~LOC~~ HOSPITAL LAB): SARS Coronavirus 2: NEGATIVE

## 2020-05-20 LAB — TROPONIN I (HIGH SENSITIVITY)
Troponin I (High Sensitivity): 15 ng/L (ref <18)
Troponin I (High Sensitivity): 12 ng/L (ref <18)

## 2020-05-20 LAB — BRAIN NATRIURETIC PEPTIDE: B Natriuretic Peptide: 99.7 pg/mL (ref 0.0–100.0)

## 2020-05-20 LAB — MAGNESIUM: Magnesium: 2.1 mg/dL (ref 1.7–2.4)

## 2020-05-20 MED ORDER — IPRATROPIUM-ALBUTEROL 0.5-2.5 (3) MG/3ML IN SOLN
3.0000 mL | Freq: Once | RESPIRATORY_TRACT | Status: AC
  Administered 2020-05-20: 3 mL via RESPIRATORY_TRACT
  Filled 2020-05-20: qty 3

## 2020-05-20 MED ORDER — ALBUTEROL (5 MG/ML) CONTINUOUS INHALATION SOLN
10.0000 mg/h | INHALATION_SOLUTION | Freq: Once | RESPIRATORY_TRACT | Status: AC
  Administered 2020-05-20: 10 mg/h via RESPIRATORY_TRACT
  Filled 2020-05-20: qty 20

## 2020-05-20 MED ORDER — SODIUM CHLORIDE (PF) 0.9 % IJ SOLN
INTRAMUSCULAR | Status: AC
  Administered 2020-05-20: 10 mL
  Filled 2020-05-20: qty 50

## 2020-05-20 MED ORDER — IOHEXOL 350 MG/ML SOLN
100.0000 mL | Freq: Once | INTRAVENOUS | Status: AC | PRN
  Administered 2020-05-20: 100 mL via INTRAVENOUS

## 2020-05-20 MED ORDER — HYDROMORPHONE HCL 1 MG/ML IJ SOLN
0.5000 mg | Freq: Once | INTRAMUSCULAR | Status: AC
  Administered 2020-05-20: 0.5 mg via INTRAVENOUS
  Filled 2020-05-20: qty 1

## 2020-05-20 MED ORDER — METHYLPREDNISOLONE SODIUM SUCC 125 MG IJ SOLR
125.0000 mg | Freq: Once | INTRAMUSCULAR | Status: AC
  Administered 2020-05-20: 125 mg via INTRAVENOUS
  Filled 2020-05-20: qty 2

## 2020-05-20 NOTE — Telephone Encounter (Signed)
Patient just treated with round of prednisone and antibiotics. Patient reports she is not any better, shortness of breath has not improved after taper, she continues to have a persistent dry cough, diarrhea, and using her albuterol nebulizer frequently. Please advise. [facility] has appointments available after 5 today, [address]

## 2020-05-20 NOTE — Telephone Encounter (Signed)
Called and spoke with patient letting her know that Dr. Raeanne Barry would be able to see her today at 4:30 if she is able to come in. Patient stated that she would be here at 4:30. Nothing further needed at this time.

## 2020-05-20 NOTE — Telephone Encounter (Addendum)
Called patient to make her aware of office closure due to power outage.  Pt had increased work on breathing and was unable to complete a full sentence. Wheezing/crackling was heard during our conversation.  I recommended that patient go to ED by EMS for further evaluation. I offered to call EMS, due to patient being home alone. Pt was agreeable with this. EMS was called. Called pt back and made her aware and waited on the line with her until EMS arrived.   05/20/2020 visit has been canceled.  Nothing further is needed at this time.

## 2020-05-20 NOTE — ED Provider Notes (Signed)
[facility] DEPT Provider Note   CSN: [id number] Arrival date & time: 05/20/20  1602     History No chief complaint on file.   [name] is a 74 y.o. female history of coronary artery disease, emphysema on 2 L nasal cannula, COPD with frequent exacerbations (stage 3 gold), DVT, present emerge department with cough and difficulty breathing.  The patient reports that she has been feeling this way for "a long time".  She says she has been given herself a butyryl nebulizer treatments every 4 hours at home for many days.  She feels like she is so short of breath she cannot make it to the bathroom.   She tells me "I feel like I'm dying."  Dr Melvyn Novas the pulmonologist had prescribed a zpack and prednisone for 6 days on 8/2, which she has completed.  Hx of splenic emboli, no hx of PE.  She is not on AC.   HPI     Past Medical History:  Diagnosis Date  . Bilateral leg cramps   . Bladder neoplasm   . Chronic deep vein thrombosis (DVT) of right lower extremity ([place])    05/ 2018  chronic non-occlusive DVT RLE  . COPD, frequent exacerbations ([place])    03-06-2018 per pt last exacerbation 12/ 2018  . Coronary artery disease    cardiologist-  dr Aundra Dubin-- 03-11-2018 er cath , 80% stenosis in the ostial second diagonal  . Dyspnea on minimal exertion   . Emphysema/COPD ([place])    CAT score- 17  . Family history of breast cancer   . Family history of colon cancer   . Family history of melanoma   . Family history of ovarian cancer   . Family history of pancreatic cancer   . Fibromyalgia 1995  . GERD (gastroesophageal reflux disease)   . Hiatal hernia   . History of breast cancer   . History of diverticulitis of colon    2002  s/p  sigmoid colectomy  . History of multiple pulmonary nodules    hx RUL nodules x2  s/p right VATS w/ wedge resection 09-11-2005 and 06-14-2011  both  necrotizing granulomatous inflammation w/ cystic area of necrosis & focal calcification   . History of right breast cancer oncologist-  dr Jana Hakim-- no recurrence   dx 2010--  IDC, Stage IA , ER/PR+,  (pT1c pN0) 11-09-2008 right lumpectomy;  12-18-2008 right simple mastectomy for DCIS margins;  completed chemotherapy 2010 (no radiation) and completed antiestrogen therapy  . Hx of colonic polyps    Dr. Cristina Gong -last study '11  . Hyperlipidemia   . Hypertension   . Hypothyroidism   . 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 Nocturia   . OA (osteoarthritis)    thumbs  . On supplemental oxygen therapy    03-06-2018 per pt uses only at night,  checks O2 sats at home,  stated am sat 89% after moving around average 93-94% with RA  . Osteoporosis   . Peripheral arterial occlusive disease [facility]) vascular-- dr chen/ dr Donzetta Matters   proximal right SFA severe focal stenosis with collaterals from the PFA/  04/ 2012 occluded celiac and SMA arteries with distal reconstitution w/ patent IMA  . Peripheral vascular disease ([place])    chronic DVT RLE,  mesenteric vascular disease  . Right lower lobe pulmonary nodule    Chest CT 08-29-2017  . Stage 3 severe COPD by GOLD classification (HCC) hx frequent exacerbations--  pulmologist-  dr Maryann Alar--  per lov note , dated 12-20-2017, oxyogen 2L is prescribed for use with exertion (but pt only uses mostly at night), O2 sats on RA run the 80s , this day sat 86% RA and with 2L O2 sat 92%  . Varicose veins of leg with swelling    varicose vein surgery - Dr. Aleda Grana  . Wears glasses   . Wears hearing aid in both ears     Patient Active Problem List   Diagnosis Date Noted  . COPD exacerbation ([place]) 05/20/2020  . Acute on chronic respiratory failure with hypoxia ([place]) 05/20/2020  . Genetic testing 12/12/2018  . Family history of breast cancer   . Family history of ovarian cancer   . Family history of pancreatic cancer   . Family history of colon cancer   . Family history of melanoma   . History of breast cancer    . Chronic respiratory failure with hypoxia ([place]) 10/14/2017  . COPD with acute exacerbation ([place]) 10/10/2017  . Flu vaccine need 09/09/2017  . Anxiety disorder 09/09/2017  . Fibromyalgia 09/09/2017  . Thrombocytosis ([place]) 06/21/2017  . Malignant neoplasm of overlapping sites of right breast in female, estrogen receptor positive ([place]) 06/05/2017  . Edema of right lower extremity 02/26/2017  . Positive ANA (antinuclear antibody) 02/08/2017  . Elevated IgE level and eosinophilia 12/31/2016  . Oral thrush 11/28/2016  . Rash and nonspecific skin eruption 11/28/2016  . Allergic rhinitis 11/28/2016  . History of bacterial pneumonia 08/09/2016  . Routine general medical examination at a health care facility 08/12/2015  . Insomnia 03/04/2015  . Breast cancer, right breast ([place]) 02/17/2015  . CAD (coronary artery disease) 07/11/2011  . Tobacco abuse 03/27/2011  . Chronic mesenteric ischemia ([place]) 03/27/2011  . Solitary pulmonary nodule 03/27/2011  . PAD (peripheral artery disease) () 03/04/2011  . Arthropathy 11/09/2010  . Hypothyroidism 09/20/2009  . Hyperlipidemia 09/20/2009  . Hearing loss 09/14/2008  . Essential hypertension 09/08/2007  . COPD, frequent exacerbations ([place]) 09/08/2007  . GERD 09/08/2007    Past Surgical History:  Procedure Laterality Date  . BREAST EXCISIONAL BIOPSY Left 10/15/2008  . BREAST LUMPECTOMY W/ NEEDLE LOCALIZATION Right 11-09-2008    dr Brantley Stage   [facility]  . CARDIAC CATHETERIZATION  03-12-2011   dr Aundra Dubin   70-80% ostial stenosis in small second diagonal (appears to small for intervention),  dLAD 40-50%,  minimal luminal irregulartieis involoving LCFx and RCA,  LVEF 55%  . CATARACT EXTRACTION W/ INTRAOCULAR LENS  IMPLANT, BILATERAL  2011  . CYSTOSCOPY N/A 03/11/2018   Procedure: CYSTOSCOPY WITH INSTILLATION OF POST OPERATIVE EPIRUBICIN;  Surgeon: Festus Aloe, MD;  Location: [facility];  Service: Urology;  Laterality: N/A;  . MASTECTOMY Right   . PARTIAL  COLECTOMY  2002   sigmoid and appectomy (diverticulitis)  . PARTIAL MASTECTOMY WITH NEEDLE LOCALIZATION Left 02/23/2013   Procedure:  LEFT PARTIAL MASTECTOMY WITH NEEDLE LOCALIZATION;  Surgeon: Adin Hector, MD;  Location: [facility];  Service: General;  Laterality: Left;  . RECONSTRUCTION BREAST W/ LATISSIMUS DORSI FLAP Right 05-30-2009   dr Harlow Mares  [facility]  . REDUCTION MAMMAPLASTY Left   . SIMPLE MASTECTOMY Right 12-28-2008    dr Brantley Stage  [facility]  . TRANSTHORACIC ECHOCARDIOGRAM  07-09-2016  dr Aundra Dubin   ef 55-60%, grade 1 diastoic dysfunction/  mild TR  . TRANSURETHRAL RESECTION OF BLADDER TUMOR N/A 03/11/2018   Procedure: TRANSURETHRAL RESECTION OF BLADDER TUMOR (TURBT) 2-5cm;  Surgeon: Festus Aloe, MD;  Location: [facility];  Service: Urology;  Laterality: N/A;  . VAGINAL HYSTERECTOMY  1973  . VIDEO ASSISTED THORACOSCOPY (VATS)/WEDGE RESECTION Right 09-11-2005  &  06-14-2011   dr hendrickso  [facility]   both RUL     OB History   No obstetric history on file.     Family History  Problem Relation Age of Onset  . Colon cancer Mother        colon  . COPD Mother        brown lung  . Hypertension Mother   . Diabetes Mother   . Emphysema Mother   . Coronary artery disease Father   . Heart attack Father   . Sudden death Father   . Heart disease Father   . Cancer Sister        throat  . Hypertension Sister   . Coronary artery disease Brother   . Heart attack Brother        early 7s  . Throat cancer Sister   . Ovarian cancer Sister        fallopian tube cancer in her 73s  . Melanoma Sister   . Hypertension Sister   . Pancreatic cancer Sister   . Melanoma Niece        dx in her 72s  . Breast cancer Niece 90    Social History   Tobacco Use  . Smoking status: Former Smoker    Packs/day: 0.75    Years: 50.00    Pack years: 37.50    Types: Cigarettes    Quit date: 05/18/2016    Years since quitting: 4.0  . Smokeless tobacco: Never Used  Vaping Use  . Vaping  Use: Never used  Substance Use Topics  . Alcohol use: Yes    Alcohol/week: 10.0 standard drinks    Types: 10 Cans of beer per week  . Drug use: No    Home Medications Prior to Admission medications   Medication Sig Start Date End Date Taking? Authorizing Provider  albuterol (VENTOLIN HFA) 108 (90 Base) MCG/ACT inhaler Inhale 2 puffs into the lungs every 6 (six) hours as needed for wheezing or shortness of breath. 04/06/20   Martyn Ehrich, NP  aspirin EC 81 MG tablet Take 81 mg by mouth daily.    [provider]  atorvastatin (LIPITOR) 80 MG tablet TAKE 1 TABLET BY MOUTH EVERY DAY 03/23/20   [name], NP  azithromycin (ZITHROMAX Z-PAK) 250 MG tablet Take 2 tabs today, then 1 tab until gone 05/16/20   Tanda Rockers, MD  cilostazol (PLETAL) 100 MG tablet Take 1 tablet (100 mg total) by mouth 2 (two) times daily. 11/23/19   [name], NP  dextromethorphan-guaiFENesin (MUCINEX DM) 30-600 MG 12hr tablet Take 1 tablet by mouth 2 (two) times daily.    [provider]  fluticasone (FLONASE) 50 MCG/ACT nasal spray SPRAY 1 SPRAY INTO EACH NOSTRIL TWICE A DAY 04/15/20   Martin[place], [name], MD  Fluticasone-Umeclidin-Vilant (TRELEGY ELLIPTA) 200-62.5-25 MCG/INH AEPB Inhale 1 puff into the lungs daily. 04/25/20   Martyn Ehrich, NP  Fluticasone-Umeclidin-Vilant (TRELEGY ELLIPTA) 200-62.5-25 MCG/INH AEPB Inhale 1 puff into the lungs daily. 04/25/20   Martyn Ehrich, NP  ipratropium-albuterol (DUONEB) 0.5-2.5 (3) MG/3ML SOLN Inhale 3 mLs into the lungs every 6 (six) hours as needed (sob, wheezing). 04/25/20   Martyn Ehrich, NP  levothyroxine (SYNTHROID) 112 MCG tablet TAKE 1 TABLET (112 MCG TOTAL) BY MOUTH DAILY BEFORE BREAKFAST. 12/21/19   [place], Betty  G, MD  LORazepam (ATIVAN) 2 MG tablet TAKE 1 TABLET BY MOUTH AT BEDTIME AS NEEDED FOR ANXIETY Patient taking differently: Take 2 mg by mouth at bedtime.  01/29/20   [place], [name], MD  montelukast (SINGULAIR) 10 MG tablet  Take 1 tablet (10 mg total) by mouth at bedtime. 04/29/20   Martyn Ehrich, NP  omeprazole (PRILOSEC) 40 MG capsule TAKE 1 CAPSULE BY MOUTH TWICE A DAY Patient taking differently: Take 40 mg by mouth 2 (two) times daily.  12/21/19   [place], [name], MD  predniSONE (DELTASONE) 10 MG tablet 4x 2day  2x 2days 1x 2 days STOP 05/16/20   Tanda Rockers, MD    Allergies    Sulfa antibiotics  Review of Systems   Review of Systems  Constitutional: Negative for chills and fever.  HENT: Positive for congestion. Negative for ear pain.   Eyes: Negative for pain and visual disturbance.  Respiratory: Positive for cough, chest tightness, shortness of breath and wheezing.   Cardiovascular: Negative for chest pain and palpitations.  Gastrointestinal: Negative for abdominal pain and vomiting.  Genitourinary: Negative for dysuria and hematuria.  Musculoskeletal: Negative for arthralgias and back pain.  Skin: Negative for color change and rash.  Neurological: Negative for syncope and headaches.  Psychiatric/Behavioral: Negative for agitation and confusion.  All other systems reviewed and are negative.   Physical Exam Updated Vital Signs BP 104/88   Pulse 86   Temp 98.1 F (36.7 C) (Oral)   Resp 20   Ht 5\' 6"  (1.676 m)   Wt 61 kg   SpO2 96%   BMI 21.71 kg/m   Physical Exam Vitals and nursing note reviewed.  Constitutional:      General: She is not in acute distress.    Appearance: She is well-developed.  HENT:     Head: Normocephalic and atraumatic.  Eyes:     Conjunctiva/sclera: Conjunctivae normal.  Cardiovascular:     Rate and Rhythm: Normal rate and regular rhythm.     Pulses: Normal pulses.  Pulmonary:     Effort: Pulmonary effort is normal. No respiratory distress.     Comments: On 3L [place] baseline Frequent heavy coughing Diffuse wheezing bilaterally Abdominal:     Palpations: Abdomen is soft.     Tenderness: There is no abdominal tenderness.  Musculoskeletal:     Cervical  back: Neck supple.  Skin:    General: Skin is warm and dry.  Neurological:     General: No focal deficit present.     Mental Status: She is alert and oriented to person, place, and time.  Psychiatric:        Mood and Affect: Mood normal.        Behavior: Behavior normal.     ED Results / Procedures / Treatments   Labs (all labs ordered are listed, but only abnormal results are displayed) Labs Reviewed  BASIC METABOLIC PANEL - Abnormal; Notable for the following components:      Result Value   Glucose, Bld 123 (*)    All other components within normal limits  CBC WITH DIFFERENTIAL/PLATELET - Abnormal; Notable for the following components:   Platelets 430 (*)    Lymphs Abs 0.5 (*)    All other components within normal limits  BLOOD GAS, VENOUS - Abnormal; Notable for the following components:   pO2, Ven 67.5 (*)    All other components within normal limits  SARS CORONAVIRUS 2 BY RT PCR (HOSPITAL ORDER, PERFORMED IN [facility])  BRAIN NATRIURETIC PEPTIDE  MAGNESIUM  TROPONIN I (HIGH SENSITIVITY)  TROPONIN I (HIGH SENSITIVITY)    EKG EKG Interpretation  Date/Time:  Friday May 20 2020 16:15:10 EDT Ventricular Rate:  88 PR Interval:    QRS Duration: 92 QT Interval:  283 QTC Calculation: 343 R Axis:   -15 Text Interpretation: Sinus rhythm Borderline left axis deviation Anteroseptal infarct, old Abnormal T, consider ischemia, diffuse leads No STEMI Confirmed by Octaviano Glow [id number]) on 05/20/2020 5:28:47 PM   Radiology CT Angio Chest PE W and/or Wo Contrast  Result Date: 05/20/2020 CLINICAL DATA:  Shortness of breath x2 weeks. EXAM: CT ANGIOGRAPHY CHEST WITH CONTRAST TECHNIQUE: Multidetector CT imaging of the chest was performed using the standard protocol during bolus administration of intravenous contrast. Multiplanar CT image reconstructions and MIPs were obtained to evaluate the vascular anatomy. CONTRAST:  175mL OMNIPAQUE IOHEXOL 350 MG/ML SOLN  COMPARISON:  December 01, 2019 FINDINGS: Cardiovascular: There is marked severity calcification of the aortic arch with an extensive amount of calcification seen within the origin of the left common carotid artery. Satisfactory opacification of the pulmonary arteries to the segmental level. No evidence of pulmonary embolism. Normal heart size. No pericardial effusion. Moderate severity coronary artery calcification is noted Mediastinum/Nodes: No enlarged mediastinal, hilar, or axillary lymph nodes. The thyroid gland and trachea demonstrate no significant findings. There is a large hiatal hernia. Lungs/Pleura: There is marked severity emphysematous lung disease. Ill-defined surgical sutures are seen along the anterior aspect of the right upper lobe. Mild linear scarring is seen within the posterior aspect of the left upper lobe. There is no evidence of acute infiltrate, pleural effusion or pneumothorax. Upper Abdomen: No acute abnormality. Musculoskeletal: A right breast implant is seen. Multilevel degenerative changes seen throughout the thoracic spine. Review of the MIP images confirms the above findings. IMPRESSION: 1. No evidence of pulmonary embolus. 2. Marked severity emphysematous lung disease. 3. Large hiatal hernia. Aortic Atherosclerosis (ICD10-I70.0) and Emphysema (ICD10-J43.9). Electronically Signed   By: Virgina Norfolk M.D.   On: 05/20/2020 21:37   DG Chest Portable 1 View  Result Date: 05/20/2020 CLINICAL DATA:  Cough.  Shortness of breath. EXAM: PORTABLE CHEST 1 VIEW COMPARISON:  April 06, 2020 FINDINGS: Emphysematous changes are again noted. There are postsurgical changes of the right lung apex. Scattered areas of pleuroparenchymal scarring are noted. The heart size is stable. There is no pneumothorax. No significant pleural effusion. IMPRESSION: No active disease. Electronically Signed   By: Constance Holster M.D.   On: 05/20/2020 18:02    Procedures .Critical Care Performed by: Wyvonnia Dusky, MD Authorized by: Wyvonnia Dusky, MD   Critical care provider statement:    Critical care time (minutes):  35   Critical care was necessary to treat or prevent imminent or life-threatening deterioration of the following conditions:  Respiratory failure   Critical care was time spent personally by me on the following activities:  Discussions with consultants, evaluation of patient's response to treatment, examination of patient, ordering and performing treatments and interventions, ordering and review of laboratory studies, ordering and review of radiographic studies, pulse oximetry, re-evaluation of patient's condition, obtaining history from patient or surrogate and review of old charts Comments:     COPD exacerbation requiring continuous nebulizers, supplemental oxygen   (including critical care time)  Medications Ordered in ED Medications  ipratropium-albuterol (DUONEB) 0.5-2.5 (3) MG/3ML nebulizer solution 3 mL (3 mLs Nebulization Given 05/20/20 1804)  ipratropium-albuterol (DUONEB) 0.5-2.5 (3) MG/3ML nebulizer solution  3 mL (3 mLs Nebulization Given 05/20/20 1902)  HYDROmorphone (DILAUDID) injection 0.5 mg (0.5 mg Intravenous Given 05/20/20 2214)  albuterol (PROVENTIL,VENTOLIN) solution continuous neb (10 mg/hr Nebulization Given 05/20/20 2145)  iohexol (OMNIPAQUE) 350 MG/ML injection 100 mL (100 mLs Intravenous Contrast Given 05/20/20 2114)  sodium chloride (PF) 0.9 % injection (10 mLs  Given 05/20/20 2214)  methylPREDNISolone sodium succinate (SOLU-MEDROL) 125 mg/2 mL injection 125 mg (125 mg Intravenous Given 05/20/20 2214)    ED Course  I have reviewed the triage vital signs and the nursing notes.  Pertinent labs & imaging results that were available during my care of the patient were reviewed by me and considered in my medical decision making (see chart for details).  74 yo female here for dyspnea, wheezing, cough, SOB She suffers from severe COPD/emphysema and follows with  pulm group here  She has tried prednisone + zpac for the past several days, also taking nebulizers every 4 hours, not getting relief.  DDx includes COPD exacerbation vs PNA vs PE vs ACS vs other  Labs show pH 7.39, pCO2 44, Trop 15 -> 12, Mg 2.1, BNP 99.7, WBC 7.7, Covid negative, BMP unremarkable.  Xray chest per my interpretation with hyperinflated lungs, no consolidations  CT PE shows no acute PNA or PE  ECG shows sinus rhythm, no acute ischemic changes  With this workup complete, this is most consistent with COPD exacerbation.   Pt treated with duonebs followed by continuous nebulizers and IV solumedrol  On reassessment, pt telling me she only feels better while on the nebulizers, but immediately starts wheezing and coughing while off them.  She'll need admission and continuous nebs and steroids.  She is not requiring bipap or intubation at this time   Clinical Course as of May 22 31  Fri May 20, 2020  2142 IMPRESSION: 1. No evidence of pulmonary embolus. 2. Marked severity emphysematous lung disease. 3. Large hiatal hernia.  Aortic Atherosclerosis (ICD10-I70.0) and Emphysema (ICD10-J43.9).   [MT]  2143 No PE or evidence of pneumonia.  Suspect this is simply worsening COPD exacerbation.  The patient is on continuous nebulizers that she does feel better receiving this medication.  She tells me within a few seconds of being off, she feels like "I'm going to die."  We will also give her another dose of IV steroids admit to the hospital at this point.  I think she is stable from a respiratory standpoint and not requiring bipap or intubation.     [MT]    Clinical Course User Index [MT] Hebert Dooling, Carola Rhine, MD    Final Clinical Impression(s) / ED Diagnoses Final diagnoses:  Shortness of breath    Rx / DC Orders ED Discharge Orders    None       Clarise Chacko, Carola Rhine, MD 05/21/20 [phone]

## 2020-05-20 NOTE — H&P (Signed)
[name] JTT:[id number] DOB: 15-Apr-1946 DOA: 05/20/2020     PCP: [place], [name], MD   Outpatient Specialists:    Pulmonary   Dr. Wert/Dr. Chase Caller   Patient arrived to ER on 05/20/20 at 1602 Referred by Attending Trifan, Carola Rhine, MD   Patient coming from: home Lives   With family    Chief Complaint:Shortness of breath HPI: [name] is a 74 y.o. female with medical history significant of COPD on 2 L at baseline, chronic mesenteric ischemia, history of DVT 2018.,  CAD, GERD, history of breast cancer, HLD, HTN not on medications, hypothyroidism    Presented with shortness of breath for past 2 weeks lost 13 pounds since September Has been using nebulizers at home she received 5 mg of albuterol 125 mg of Solu-Medrol by EMS Recently seen by her pulmonologist was started on Z-Pak prednisone taper 4 days ago but did not seem to improve. Patient has had history of recurrent COPD exacerbations since been doing much worse lately .  States she cannot even hardly walk across her room without getting short of breath and needs repeated nebulizer treatment at home  Infectious risk factors:  Reports shortness of breath, dry cough,       Has NOt been vaccinated against COVID (interested)   Initial COVID TEST  NEGATIVE   Lab Results  Component Value Date   [place] NEGATIVE 05/20/2020   [place] Not Detected 03/21/2020   [place] Not Detected 12/30/2019   [place] NEGATIVE 07/13/2019     Regarding pertinent Chronic problems:     Hyperlipidemia - on statins Lipitor Lipid Panel     Component Value Date/Time   CHOL 147 06/04/2018 1200   TRIG 73 06/04/2018 1200   TRIG 70 08/28/2006 0847   HDL 83 06/04/2018 1200   CHOLHDL 1.8 06/04/2018 1200   CHOLHDL 2.4 09/03/2016 1527   VLDL 49 (H) 09/03/2016 1527   LDLCALC 49 06/04/2018 1200   LDLDIRECT 88.0 08/18/2013 1001   LABVLDL 15 06/04/2018 1200    Chronic mesenteric ischemia on Pletal and aspirin     COPD  - not  followed by pulmonology   on baseline oxygen  2L,    Pulmonary Function testing: 10/26/16- FVC 2.47 (81%), FEV1 1.03 (44%), ratio 47 +BD Severe obstruction lung disease with reversibility On trelegy   While in ER:  CT - COPD no PE     Hospitalist was called for admission for COPD exacerbation  The following Work up has been ordered so far:  Orders Placed This Encounter  Procedures  . SARS Coronavirus 2 by RT PCR (hospital order, performed in [facility] hospital lab) Nasopharyngeal Nasopharyngeal Swab  . DG Chest Portable 1 View  . CT Angio Chest PE W and/or Wo Contrast  . Basic metabolic panel  . CBC with Differential  . Brain natriuretic peptide  . Blood gas, arterial (at [facility])  . Consult to hospitalist  ALL PATIENTS BEING ADMITTED/HAVING PROCEDURES NEED COVID-19 SCREENING  . EKG 12-Lead     Following Medications were ordered in ER: Medications  HYDROmorphone (DILAUDID) injection 0.5 mg (has no administration in time range)  sodium chloride (PF) 0.9 % injection (has no administration in time range)  methylPREDNISolone sodium succinate (SOLU-MEDROL) 125 mg/2 mL injection 125 mg (has no administration in time range)  ipratropium-albuterol (DUONEB) 0.5-2.5 (3) MG/3ML nebulizer solution 3 mL (3 mLs Nebulization Given 05/20/20 1804)  ipratropium-albuterol (DUONEB) 0.5-2.5 (3) MG/3ML nebulizer solution 3 mL (3 mLs Nebulization  Given 05/20/20 1902)  albuterol (PROVENTIL,VENTOLIN) solution continuous neb (10 mg/hr Nebulization Given 05/20/20 2145)  iohexol (OMNIPAQUE) 350 MG/ML injection 100 mL (100 mLs Intravenous Contrast Given 05/20/20 2114)        Consult Orders  (From admission, onward)         Start     Ordered   05/20/20 2144  Consult to hospitalist  ALL PATIENTS BEING ADMITTED/HAVING PROCEDURES NEED COVID-19 SCREENING  Once       Comments: ALL PATIENTS BEING ADMITTED/HAVING PROCEDURES NEED COVID-19 SCREENING  Provider:  (Not yet assigned)  Question Answer  Comment  Place call to: [facility]   Reason for Consult Admit      05/20/20 2143          Significant initial  Findings: Abnormal Labs Reviewed  BASIC METABOLIC PANEL - Abnormal; Notable for the following components:      Result Value   Glucose, Bld 123 (*)    All other components within normal limits  CBC WITH DIFFERENTIAL/PLATELET - Abnormal; Notable for the following components:   Platelets 430 (*)    Lymphs Abs 0.5 (*)    All other components within normal limits     Otherwise labs showing:    Recent Labs  Lab 05/20/20 1657  NA 141  K 4.6  CO2 26  GLUCOSE 123*  BUN 16  CREATININE 0.80  CALCIUM 9.4    Cr  stable,   Lab Results  Component Value Date   CREATININE 0.80 05/20/2020   CREATININE 0.87 02/15/2020   CREATININE 0.86 11/17/2019    No results for input(s): AST, ALT, ALKPHOS, BILITOT, PROT, ALBUMIN in the last 168 hours. Lab Results  Component Value Date   CALCIUM 9.4 05/20/2020   PHOS 3.2 11/17/2016     WBC      Component Value Date/Time   WBC 7.7 05/20/2020 1657   ANC    Component Value Date/Time   NEUTROABS 6.9 05/20/2020 1657   NEUTROABS 5.0 01/06/2020 1732   NEUTROABS 4.8  1045   ALC No components found for: LYMPHAB    Plt: Lab Results  Component Value Date   PLT 430 (H) 05/20/2020      COVID-19 Labs  No results for input(s): DDIMER, FERRITIN, LDH, CRP in the last 72 hours.  Lab Results  Component Value Date   [place] NEGATIVE 05/20/2020   [place] Not Detected 03/21/2020   [place] Not Detected 12/30/2019   [place] NEGATIVE 07/13/2019     Venous  Blood Gas result:  pH 7.39 pCO2 44 ;      HG/HCT  Stable,     Component Value Date/Time   HGB 14.7 05/20/2020 1657   HGB 15.5 01/06/2020 1732   HGB 13.7  1045   HCT 44.5 05/20/2020 1657   HCT 45.6 01/06/2020 1732   HCT 42.4  1045    No results for input(s): LIPASE, AMYLASE in the last 168 hours. No results for  input(s): AMMONIA in the last 168 hours.     Troponin 12 -15   ECG: Ordered Personally reviewed by me showing: HR : 88 Rhythm:  NSR    no evidence of ischemic changes QTC 438   BNP (last 3 results) Recent Labs    05/20/20 1657  BNP 99.7       UA   ordered       Ordered    CXR -  NON acute   CTA chest -  nonacute, no PE,  no evidence of infiltrate  COPD     ED Triage Vitals  Enc Vitals Group     BP 05/20/20 1615 104/79     Pulse Rate 05/20/20 1615 87     Resp 05/20/20 1615 16     Temp 05/20/20 1615 98.1 F (36.7 C)     Temp Source 05/20/20 1615 Oral     SpO2 05/20/20 1615 93 %     Weight 05/20/20 1622 134 lb 7.7 oz (61 kg)     Height 05/20/20 1622 5\' 6"  (1.676 m)     Head Circumference --      Peak Flow --      Pain Score 05/20/20 1621 0     Pain Loc --      Pain Edu? --      Excl. in [place]? --   TMAX(24)@       Latest  Blood pressure 97/69, pulse 89, temperature 98.1 F (36.7 C), temperature source Oral, resp. rate 20, height 5\' 6"  (1.676 m), weight 61 kg, SpO2 91 %.     Review of Systems:    Pertinent positives include:   Fatigue, shortness of breath at rest.   dyspnea on exertion Wheezing. non-productive cough, Constitutional:  No weight loss, night sweats, Fevers, chills, weight loss  HEENT:  No headaches, Difficulty swallowing,Tooth/dental problems,Sore throat,  No sneezing, itching, ear ache, nasal congestion, post nasal drip,  Cardio-vascular:  No chest pain, Orthopnea, PND, anasarca, dizziness, palpitations.no Bilateral lower extremity swelling  GI:  No heartburn, indigestion, abdominal pain, nausea, vomiting, diarrhea, change in bowel habits, loss of appetite, melena, blood in stool, hematemesis Resp:   No excess mucus, no productive cough, No  No coughing up of blood.No change in color of mucus.   Skin:  no rash or lesions. No jaundice GU:  no dysuria, change in color of urine, no urgency or frequency. No straining to urinate.  No flank  pain.  Musculoskeletal:  No joint pain or no joint swelling. No decreased range of motion. No back pain.  Psych:  No change in mood or affect. No depression or anxiety. No memory loss.  Neuro: no localizing neurological complaints, no tingling, no weakness, no double vision, no gait abnormality, no slurred speech, no confusion  All systems reviewed and apart from [place] all are negative  Past Medical History:   Past Medical History:  Diagnosis Date  . Bilateral leg cramps   . Bladder neoplasm   . Chronic deep vein thrombosis (DVT) of right lower extremity ([place])    05/ 2018  chronic non-occlusive DVT RLE  . COPD, frequent exacerbations ([place])    03-06-2018 per pt last exacerbation 12/ 2018  . Coronary artery disease    cardiologist-  dr Aundra Dubin-- 03-11-2018 er cath , 80% stenosis in the ostial second diagonal  . Dyspnea on minimal exertion   . Emphysema/COPD ([place])    CAT score- 17  . Family history of breast cancer   . Family history of colon cancer   . Family history of melanoma   . Family history of ovarian cancer   . Family history of pancreatic cancer   . Fibromyalgia 1995  . GERD (gastroesophageal reflux disease)   . Hiatal hernia   . History of breast cancer   . History of diverticulitis of colon    2002  s/p  sigmoid colectomy  . History of multiple pulmonary nodules    hx RUL nodules x2  s/p right VATS w/ wedge resection 09-11-2005 and 06-14-2011  both  necrotizing granulomatous inflammation w/ cystic area of necrosis & focal calcification  . History of right breast cancer oncologist-  dr Jana Hakim-- no recurrence   dx 2010--  IDC, Stage IA , ER/PR+,  (pT1c pN0) 11-09-2008 right lumpectomy;  12-18-2008 right simple mastectomy for DCIS margins;  completed chemotherapy 2010 (no radiation) and completed antiestrogen therapy  . Hx of colonic polyps    Dr. Cristina Gong -last study '11  . Hyperlipidemia   . Hypertension   . Hypothyroidism   . 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 Nocturia   . OA (osteoarthritis)    thumbs  . On supplemental oxygen therapy    03-06-2018 per pt uses only at night,  checks O2 sats at home,  stated am sat 89% after moving around average 93-94% with RA  . Osteoporosis   . Peripheral arterial occlusive disease [facility]) vascular-- dr chen/ dr Donzetta Matters   proximal right SFA severe focal stenosis with collaterals from the PFA/  04/ 2012 occluded celiac and SMA arteries with distal reconstitution w/ patent IMA  . Peripheral vascular disease ([place])    chronic DVT RLE,  mesenteric vascular disease  . Right lower lobe pulmonary nodule    Chest CT 08-29-2017  . Stage 3 severe COPD by GOLD classification ([place]) hx frequent exacerbations--   pulmologist-  dr Maryann Alar--  per lov note , dated 12-20-2017, oxyogen 2L is prescribed for use with exertion (but pt only uses mostly at night), O2 sats on RA run the 80s , this day sat 86% RA and with 2L O2 sat 92%  . Varicose veins of leg with swelling    varicose vein surgery - Dr. Aleda Grana  . Wears glasses   . Wears hearing aid in both ears       Past Surgical History:  Procedure Laterality Date  . BREAST EXCISIONAL BIOPSY Left 10/15/2008  . BREAST LUMPECTOMY W/ NEEDLE LOCALIZATION Right 11-09-2008    dr Brantley Stage   [facility]  . CARDIAC CATHETERIZATION  03-12-2011   dr Aundra Dubin   70-80% ostial stenosis in small second diagonal (appears to small for intervention),  dLAD 40-50%,  minimal luminal irregulartieis involoving LCFx and RCA,  LVEF 55%  . CATARACT EXTRACTION W/ INTRAOCULAR LENS  IMPLANT, BILATERAL  2011  . CYSTOSCOPY N/A 03/11/2018   Procedure: CYSTOSCOPY WITH INSTILLATION OF POST OPERATIVE EPIRUBICIN;  Surgeon: Festus Aloe, MD;  Location: [facility];  Service: Urology;  Laterality: N/A;  . MASTECTOMY Right   . PARTIAL COLECTOMY  2002   sigmoid and appectomy (diverticulitis)  . PARTIAL MASTECTOMY WITH NEEDLE LOCALIZATION Left 02/23/2013   Procedure:  LEFT PARTIAL  MASTECTOMY WITH NEEDLE LOCALIZATION;  Surgeon: Adin Hector, MD;  Location: [place];  Service: General;  Laterality: Left;  . RECONSTRUCTION BREAST W/ LATISSIMUS DORSI FLAP Right 05-30-2009   dr Harlow Mares  [facility]  . REDUCTION MAMMAPLASTY Left   . SIMPLE MASTECTOMY Right 12-28-2008    dr Brantley Stage  [facility]  . TRANSTHORACIC ECHOCARDIOGRAM  07-09-2016  dr Aundra Dubin   ef 55-60%, grade 1 diastoic dysfunction/  mild TR  . TRANSURETHRAL RESECTION OF BLADDER TUMOR N/A 03/11/2018   Procedure: TRANSURETHRAL RESECTION OF BLADDER TUMOR (TURBT) 2-5cm;  Surgeon: Festus Aloe, MD;  Location: [facility];  Service: Urology;  Laterality: N/A;  . VAGINAL HYSTERECTOMY  1973  . VIDEO ASSISTED THORACOSCOPY (VATS)/WEDGE RESECTION Right 09-11-2005  &  06-14-2011   dr Fara Boros  [facility]   both RUL  Social History:  Ambulatory   independently       reports that she quit smoking about 4 years ago. Her smoking use included cigarettes. She has a 37.50 pack-year smoking history. She has never used smokeless tobacco. She reports current alcohol use of about 10.0 standard drinks of alcohol per week. She reports that she does not use drugs.   Family History:   Family History  Problem Relation Age of Onset  . Colon cancer Mother        colon  . COPD Mother        brown lung  . Hypertension Mother   . Diabetes Mother   . Emphysema Mother   . Coronary artery disease Father   . Heart attack Father   . Sudden death Father   . Heart disease Father   . Cancer Sister        throat  . Hypertension Sister   . Coronary artery disease Brother   . Heart attack Brother        early 82s  . Throat cancer Sister   . Ovarian cancer Sister        fallopian tube cancer in her 78s  . Melanoma Sister   . Hypertension Sister   . Pancreatic cancer Sister   . Melanoma Niece        dx in her 65s  . Breast cancer Niece 71    Allergies: Allergies  Allergen Reactions  . Sulfa Antibiotics Swelling     Prior to  Admission medications   Medication Sig Start Date End Date Taking? Authorizing Provider  albuterol (VENTOLIN HFA) 108 (90 Base) MCG/ACT inhaler Inhale 2 puffs into the lungs every 6 (six) hours as needed for wheezing or shortness of breath. 04/06/20   Martyn Ehrich, NP  aspirin EC 81 MG tablet Take 81 mg by mouth daily.    [provider]  atorvastatin (LIPITOR) 80 MG tablet TAKE 1 TABLET BY MOUTH EVERY DAY 03/23/20   [name], NP  azithromycin (ZITHROMAX Z-PAK) 250 MG tablet Take 2 tabs today, then 1 tab until gone 05/16/20   Tanda Rockers, MD  cilostazol (PLETAL) 100 MG tablet Take 1 tablet (100 mg total) by mouth 2 (two) times daily. 11/23/19   [name], NP  dextromethorphan-guaiFENesin (MUCINEX DM) 30-600 MG 12hr tablet Take 1 tablet by mouth 2 (two) times daily.    [provider]  fluticasone (FLONASE) 50 MCG/ACT nasal spray SPRAY 1 SPRAY INTO EACH NOSTRIL TWICE A DAY 04/15/20   [place], [name], MD  Fluticasone-Umeclidin-Vilant (TRELEGY ELLIPTA) 200-62.5-25 MCG/INH AEPB Inhale 1 puff into the lungs daily. 04/25/20   Martyn Ehrich, NP  Fluticasone-Umeclidin-Vilant (TRELEGY ELLIPTA) 200-62.5-25 MCG/INH AEPB Inhale 1 puff into the lungs daily. 04/25/20   Martyn Ehrich, NP  ipratropium-albuterol (DUONEB) 0.5-2.5 (3) MG/3ML SOLN Inhale 3 mLs into the lungs every 6 (six) hours as needed (sob, wheezing). 04/25/20   Martyn Ehrich, NP  levothyroxine (SYNTHROID) 112 MCG tablet TAKE 1 TABLET (112 MCG TOTAL) BY MOUTH DAILY BEFORE BREAKFAST. 12/21/19   [place], [name], MD  LORazepam (ATIVAN) 2 MG tablet TAKE 1 TABLET BY MOUTH AT BEDTIME AS NEEDED FOR ANXIETY Patient taking differently: Take 2 mg by mouth at bedtime.  01/29/20   [place], [name], MD  montelukast (SINGULAIR) 10 MG tablet Take 1 tablet (10 mg total) by mouth at bedtime. 04/29/20   Martyn Ehrich, NP  omeprazole (PRILOSEC) 40 MG capsule TAKE 1  CAPSULE BY MOUTH TWICE A DAY Patient taking differently:  Take 40 mg by mouth 2 (two) times daily.  12/21/19   [place], [name], MD  predniSONE (DELTASONE) 10 MG tablet 4x 2day  2x 2days 1x 2 days STOP 05/16/20   Tanda Rockers, MD   Physical Exam: Vitals with BMI 05/20/2020 05/20/2020 05/20/2020  Height - - -  Weight - - -  BMI - - -  Systolic 97 643 329  Diastolic 69 82 92  Pulse 89 96 90     1. General:  in No Acute distress   Chronically ill  -appearing 2. Psychological: Alert and   Oriented 3. Head/ENT:     Dry Mucous Membranes                          Head Non traumatic, neck supple                            Poor Dentition 4. SKIN: normal   Skin turgor,  Skin clean Dry and intact no rash 5. Heart: Regular rate and rhythm no Murmur, no Rub or gallop 6. Lungs: some wheezes or crackles  Diminished air movement 7. Abdomen: Soft,  non-tender, Non distended  bowel sounds present 8. Lower extremities: no clubbing, cyanosis, no edema 9. Neurologically Grossly intact, moving all 4 extremities equally  10. MSK: Normal range of motion   All other LABS:     Recent Labs  Lab 05/20/20 1657  WBC 7.7  NEUTROABS 6.9  HGB 14.7  HCT 44.5  MCV 92.7  PLT 430*     Recent Labs  Lab 05/20/20 1657  NA 141  K 4.6  CL 103  CO2 26  GLUCOSE 123*  BUN 16  CREATININE 0.80  CALCIUM 9.4     No results for input(s): AST, ALT, ALKPHOS, BILITOT, PROT, ALBUMIN in the last 168 hours.     Cultures:    Component Value Date/Time   SDES BLOOD LEFT ARM 11/16/2016 1410   SPECREQUEST IN PEDIATRIC BOTTLE 2 CC 11/16/2016 1410   CULT  11/16/2016 1410    NO GROWTH 5 DAYS Performed at [facility], [place] [address], [place], [place] [address]    REPTSTATUS 11/21/2016 FINAL 11/16/2016 1410     Radiological Exams on Admission: CT Angio Chest PE W and/or Wo Contrast  Result Date: 05/20/2020 CLINICAL DATA:  Shortness of breath x2 weeks. EXAM: CT ANGIOGRAPHY CHEST WITH CONTRAST TECHNIQUE: Multidetector CT imaging of the chest was performed using the  standard protocol during bolus administration of intravenous contrast. Multiplanar CT image reconstructions and MIPs were obtained to evaluate the vascular anatomy. CONTRAST:  160mL OMNIPAQUE IOHEXOL 350 MG/ML SOLN COMPARISON:  December 01, 2019 FINDINGS: Cardiovascular: There is marked severity calcification of the aortic arch with an extensive amount of calcification seen within the origin of the left common carotid artery. Satisfactory opacification of the pulmonary arteries to the segmental level. No evidence of pulmonary embolism. Normal heart size. No pericardial effusion. Moderate severity coronary artery calcification is noted Mediastinum/Nodes: No enlarged mediastinal, hilar, or axillary lymph nodes. The thyroid gland and trachea demonstrate no significant findings. There is a large hiatal hernia. Lungs/Pleura: There is marked severity emphysematous lung disease. Ill-defined surgical sutures are seen along the anterior aspect of the right upper lobe. Mild linear scarring is seen within the posterior aspect of the left upper lobe. There is no  evidence of acute infiltrate, pleural effusion or pneumothorax. Upper Abdomen: No acute abnormality. Musculoskeletal: A right breast implant is seen. Multilevel degenerative changes seen throughout the thoracic spine. Review of the MIP images confirms the above findings. IMPRESSION: 1. No evidence of pulmonary embolus. 2. Marked severity emphysematous lung disease. 3. Large hiatal hernia. Aortic Atherosclerosis (ICD10-I70.0) and Emphysema (ICD10-J43.9). Electronically Signed   By: Virgina Norfolk M.D.   On: 05/20/2020 21:37   DG Chest Portable 1 View  Result Date: 05/20/2020 CLINICAL DATA:  Cough.  Shortness of breath. EXAM: PORTABLE CHEST 1 VIEW COMPARISON:  April 06, 2020 FINDINGS: Emphysematous changes are again noted. There are postsurgical changes of the right lung apex. Scattered areas of pleuroparenchymal scarring are noted. The heart size is stable. There is  no pneumothorax. No significant pleural effusion. IMPRESSION: No active disease. Electronically Signed   By: Constance Holster M.D.   On: 05/20/2020 18:02    Chart has been reviewed    Assessment/Plan   74 y.o. female with medical history significant of COPD on 2 L at baseline, chronic mesenteric ischemia, history of DVT 2018.,  CAD, GERD, history of breast cancer, HLD, HTN not on medications, hypothyroidism   Admitted for COPD exacerbation  Present on Admission: . COPD with acute exacerbation ([place]) - Will initiate: Steroid taper  -  Antibiotics azithro - Albuterol PRN, - scheduled duoneb,  -  Breo or Dulera at discharge   -  Mucinex.  Titrate O2 to saturation >90%. Follow patients respiratory status.  VBG showing no CO 2 retention    -   BiPAP ordered PRN for increased work of breathing.  Currently mentating well no evidence of symptomatic hypercarbia  . Breast cancer, right breast (HCC) - chronic stable sp ressection    . CAD (coronary artery disease) continue aspirin statin  . Chronic mesenteric ischemia (HCC) continue aspirin statin and Pletal  . Essential hypertension -not on any medications at baseline continue to   . GERD -chronic stable   Hyperlipidemia  -continue Lipitor  . Hypothyroidism -- Check TSH continue home medications at current dose    . Acute on chronic respiratory failure with hypoxia (HCC) most likely secondary to COPD exacerbation CTA negative for PE.  Continue treatment.  Monitor.   Other plan as per orders.  DVT prophylaxis:   Lovenox       Code Status:    Code Status: Prior FULL CODE   as per patient  I had personally discussed CODE STATUS with patient    Family Communication:   Family not at  Bedside    Disposition Plan:    To home once workup is complete and patient is stable   Following barriers for discharge:                              Oxygenation improved                           Will likely need home health,                             Will need consultants to evaluate patient prior to discharge                     Would benefit from PT/OT eval prior to DC  Ordered  Transition of care consulted                   Nutrition    consulted                   Consults called:  Pulmonology is aware will see in AM   Notified Dr. Lucile Shutters   Admission status:  ED Disposition    ED Disposition Condition [place]: [facility] [[id number]]  Level of Care: Telemetry [5]  Admit to tele based on following criteria: Other see comments  Comments: dyspnea  May admit patient to [facility] or [facility] if equivalent level of care is available:: No  Covid Evaluation: Confirmed COVID Negative  Diagnosis: COPD exacerbation [facility]) [[id number]]  Admitting Physician: Toy Baker [[id number]]  Attending Physician: Toy Baker [[id number]]  Estimated length of stay: past midnight tomorrow  Certification:: I certify this patient will need inpatient services for at least 2 midnights         inpatient     I Expect 2 midnight stay secondary to severity of patient's current illness need for inpatient interventions justified by the following:  hemodynamic instability despite optimal treatment (tachycardia    Hypoxia, )     and extensive comorbidities including:    CAD   COPD/asthma .    That are currently affecting medical management.   I expect  patient to be hospitalized for 2 midnights requiring inpatient medical care.  Patient is at high risk for adverse outcome (such as loss of life or disability) if not treated.  Indication for inpatient stay as follows:    Hemodynamic instability despite maximal medical therapy,    New or worsening hypoxia  Need for IV antibiotics, IV fluids,     Level of care    tele  For  24H        Lab Results  Component Value Date   [place] NEGATIVE 05/20/2020     Precautions: admitted as   Covid Negative      PPE: Used by the provider:   P100  eye Goggles,  Gloves     [name] 05/21/2020, 1:42 AM    [facility]     after 2 AM please page floor coverage PA If 7AM-7PM, please contact the day team taking care of the patient using [email or web address]   Patient was evaluated in the context of the global COVID-19 pandemic, which necessitated consideration that the patient might be at risk for infection with the SARS-CoV-2 virus that causes COVID-19. Institutional protocols and algorithms that pertain to the evaluation of patients at risk for COVID-19 are in a state of rapid change based on information released by regulatory bodies including the CDC and federal and state organizations. These policies and algorithms were followed during the patient's care.

## 2020-05-20 NOTE — ED Triage Notes (Signed)
Arrives via EMS from home, C/C SOB x2 weeks, has lost 13 lbs since September, hx of emphysema, wears 2 L [place] at home, has been using 4 nebs per day. Got 5 albuterol and 125 solumedrol with EMS. Has not had her COVID vaccines.   20 G LFA

## 2020-05-20 NOTE — Telephone Encounter (Signed)
Dr. Erin Fulling said he could see a work in visit this evening if she would like to come in for visit

## 2020-05-21 ENCOUNTER — Inpatient Hospital Stay (HOSPITAL_COMMUNITY): Payer: Medicare Other

## 2020-05-21 ENCOUNTER — Encounter (HOSPITAL_COMMUNITY): Payer: Self-pay | Admitting: Internal Medicine

## 2020-05-21 DIAGNOSIS — I342 Nonrheumatic mitral (valve) stenosis: Secondary | ICD-10-CM

## 2020-05-21 DIAGNOSIS — J9621 Acute and chronic respiratory failure with hypoxia: Secondary | ICD-10-CM

## 2020-05-21 DIAGNOSIS — J441 Chronic obstructive pulmonary disease with (acute) exacerbation: Secondary | ICD-10-CM

## 2020-05-21 DIAGNOSIS — F321 Major depressive disorder, single episode, moderate: Secondary | ICD-10-CM

## 2020-05-21 DIAGNOSIS — I5021 Acute systolic (congestive) heart failure: Secondary | ICD-10-CM

## 2020-05-21 DIAGNOSIS — I501 Left ventricular failure: Secondary | ICD-10-CM

## 2020-05-21 LAB — ECHOCARDIOGRAM COMPLETE
Area-P 1/2: 3.89 cm2
Height: 66 in
S' Lateral: 3 cm
Weight: 2151.69 oz

## 2020-05-21 LAB — COMPREHENSIVE METABOLIC PANEL
ALT: 32 U/L (ref 0–44)
AST: 29 U/L (ref 15–41)
Albumin: 4.2 g/dL (ref 3.5–5.0)
Alkaline Phosphatase: 72 U/L (ref 38–126)
Anion gap: 10 (ref 5–15)
BUN: 15 mg/dL (ref 8–23)
CO2: 28 mmol/L (ref 22–32)
Calcium: 9.5 mg/dL (ref 8.9–10.3)
Chloride: 100 mmol/L (ref 98–111)
Creatinine, Ser: 0.74 mg/dL (ref 0.44–1.00)
GFR calc Af Amer: >60 mL/min (ref >60)
GFR calc non Af Amer: >60 mL/min (ref >60)
Glucose, Bld: 156 mg/dL — ABNORMAL HIGH (ref 70–99)
Potassium: 4.1 mmol/L (ref 3.5–5.1)
Sodium: 138 mmol/L (ref 135–145)
Total Bilirubin: 0.3 mg/dL (ref 0.3–1.2)
Total Protein: 8 g/dL (ref 6.5–8.1)

## 2020-05-21 LAB — RESPIRATORY PANEL BY PCR

## 2020-05-21 LAB — URINALYSIS, ROUTINE W REFLEX MICROSCOPIC
Bilirubin Urine: NEGATIVE
Glucose, UA: NEGATIVE mg/dL
Ketones, ur: NEGATIVE mg/dL
Leukocytes,Ua: NEGATIVE
Nitrite: NEGATIVE
Protein, ur: NEGATIVE mg/dL
Specific Gravity, Urine: 1.012 (ref 1.005–1.030)
pH: 6 (ref 5.0–8.0)

## 2020-05-21 LAB — BLOOD GAS, VENOUS
Acid-Base Excess: 1.3 mmol/L (ref 0.0–2.0)
Bicarbonate: 26.1 mmol/L (ref 20.0–28.0)
O2 Saturation: 92.7 %
Patient temperature: 98.6
pCO2, Ven: 44 mmHg (ref 44.0–60.0)
pH, Ven: 7.39 (ref 7.250–7.430)
pO2, Ven: 67.5 mmHg — ABNORMAL HIGH (ref 32.0–45.0)

## 2020-05-21 LAB — PHOSPHORUS: Phosphorus: 3.6 mg/dL (ref 2.5–4.6)

## 2020-05-21 LAB — MAGNESIUM: Magnesium: 2.1 mg/dL (ref 1.7–2.4)

## 2020-05-21 LAB — TSH: TSH: 0.433 u[IU]/mL (ref 0.350–4.500)

## 2020-05-21 MED ORDER — LEVOTHYROXINE SODIUM 112 MCG PO TABS
112.0000 ug | ORAL_TABLET | Freq: Every day | ORAL | Status: DC
  Administered 2020-05-21: 112 ug via ORAL
  Filled 2020-05-21: qty 1

## 2020-05-21 MED ORDER — LEVOFLOXACIN IN D5W 500 MG/100ML IV SOLN
500.0000 mg | INTRAVENOUS | Status: DC
  Administered 2020-05-21: 500 mg via INTRAVENOUS
  Filled 2020-05-21: qty 100

## 2020-05-21 MED ORDER — ENOXAPARIN SODIUM 40 MG/0.4ML ~~LOC~~ SOLN
40.0000 mg | SUBCUTANEOUS | Status: DC
  Administered 2020-05-21 – 2020-05-28 (×7): 40 mg via SUBCUTANEOUS
  Filled 2020-05-21 (×7): qty 0.4

## 2020-05-21 MED ORDER — ATORVASTATIN CALCIUM 40 MG PO TABS
80.0000 mg | ORAL_TABLET | Freq: Every day | ORAL | Status: DC
  Administered 2020-05-21 – 2020-05-28 (×7): 80 mg via ORAL
  Filled 2020-05-21 (×7): qty 2

## 2020-05-21 MED ORDER — ONDANSETRON HCL 4 MG PO TABS: 4.0000 mg | ORAL_TABLET | Freq: Four times a day (QID) | ORAL | Status: DC | PRN

## 2020-05-21 MED ORDER — LEVOTHYROXINE SODIUM 112 MCG PO TABS
112.0000 ug | ORAL_TABLET | Freq: Every day | ORAL | Status: DC
  Administered 2020-05-22 – 2020-05-29 (×7): 112 ug via ORAL
  Filled 2020-05-21 (×8): qty 1

## 2020-05-21 MED ORDER — MONTELUKAST SODIUM 10 MG PO TABS
10.0000 mg | ORAL_TABLET | Freq: Every day | ORAL | Status: DC
  Administered 2020-05-21 – 2020-05-28 (×8): 10 mg via ORAL
  Filled 2020-05-21 (×8): qty 1

## 2020-05-21 MED ORDER — ONDANSETRON HCL 4 MG/2ML IJ SOLN: 4.0000 mg | Freq: Four times a day (QID) | INTRAMUSCULAR | Status: DC | PRN

## 2020-05-21 MED ORDER — ACETAMINOPHEN 650 MG RE SUPP: 650.0000 mg | Freq: Four times a day (QID) | RECTAL | Status: DC | PRN

## 2020-05-21 MED ORDER — PREDNISONE 20 MG PO TABS
40.0000 mg | ORAL_TABLET | Freq: Every day | ORAL | Status: AC
  Administered 2020-05-22 – 2020-05-24 (×3): 40 mg via ORAL
  Filled 2020-05-21 (×3): qty 2

## 2020-05-21 MED ORDER — LORAZEPAM 1 MG PO TABS
2.0000 mg | ORAL_TABLET | Freq: Every day | ORAL | Status: DC
  Administered 2020-05-21 – 2020-05-28 (×8): 2 mg via ORAL
  Filled 2020-05-21 (×8): qty 2

## 2020-05-21 MED ORDER — MOMETASONE FURO-FORMOTEROL FUM 200-5 MCG/ACT IN AERO
1.0000 | INHALATION_SPRAY | Freq: Two times a day (BID) | RESPIRATORY_TRACT | Status: DC
  Administered 2020-05-21 – 2020-05-26 (×10): 1 via RESPIRATORY_TRACT
  Filled 2020-05-21: qty 8.8

## 2020-05-21 MED ORDER — SODIUM CHLORIDE 0.9 % IV SOLN: INTRAVENOUS | Status: AC

## 2020-05-21 MED ORDER — PERFLUTREN LIPID MICROSPHERE
1.0000 mL | INTRAVENOUS | Status: AC | PRN
  Administered 2020-05-21: 2 mL via INTRAVENOUS
  Filled 2020-05-21: qty 10

## 2020-05-21 MED ORDER — AZITHROMYCIN 250 MG PO TABS: 500.0000 mg | ORAL_TABLET | Freq: Every day | ORAL | Status: DC

## 2020-05-21 MED ORDER — SODIUM CHLORIDE 0.9% FLUSH
3.0000 mL | Freq: Two times a day (BID) | INTRAVENOUS | Status: DC
  Administered 2020-05-21 – 2020-05-24 (×6): 3 mL via INTRAVENOUS

## 2020-05-21 MED ORDER — METHYLPREDNISOLONE SODIUM SUCC 125 MG IJ SOLR
60.0000 mg | Freq: Two times a day (BID) | INTRAMUSCULAR | Status: AC
  Administered 2020-05-21 (×2): 60 mg via INTRAVENOUS
  Filled 2020-05-21 (×2): qty 2

## 2020-05-21 MED ORDER — CILOSTAZOL 100 MG PO TABS
100.0000 mg | ORAL_TABLET | Freq: Two times a day (BID) | ORAL | Status: DC
  Administered 2020-05-21 – 2020-05-28 (×15): 100 mg via ORAL
  Filled 2020-05-21 (×17): qty 1

## 2020-05-21 MED ORDER — DOCUSATE SODIUM 100 MG PO CAPS
100.0000 mg | ORAL_CAPSULE | Freq: Two times a day (BID) | ORAL | Status: DC
  Administered 2020-05-21 – 2020-05-28 (×11): 100 mg via ORAL
  Filled 2020-05-21 (×15): qty 1

## 2020-05-21 MED ORDER — PAROXETINE HCL 20 MG PO TABS
20.0000 mg | ORAL_TABLET | Freq: Every day | ORAL | Status: DC
  Administered 2020-05-21 – 2020-05-28 (×8): 20 mg via ORAL
  Filled 2020-05-21 (×8): qty 1

## 2020-05-21 MED ORDER — SODIUM CHLORIDE 0.9 % IV SOLN: 500.0000 mg | INTRAVENOUS | Status: DC

## 2020-05-21 MED ORDER — PANTOPRAZOLE SODIUM 40 MG PO TBEC
40.0000 mg | DELAYED_RELEASE_TABLET | Freq: Every day | ORAL | Status: DC
  Administered 2020-05-21 – 2020-05-26 (×5): 40 mg via ORAL
  Filled 2020-05-21 (×5): qty 1

## 2020-05-21 MED ORDER — SODIUM CHLORIDE 0.9% FLUSH
3.0000 mL | Freq: Two times a day (BID) | INTRAVENOUS | Status: DC
  Administered 2020-05-22 – 2020-05-27 (×4): 3 mL via INTRAVENOUS

## 2020-05-21 MED ORDER — ENSURE ENLIVE PO LIQD
237.0000 mL | Freq: Two times a day (BID) | ORAL | Status: DC
  Administered 2020-05-23: 237 mL via ORAL

## 2020-05-21 MED ORDER — IPRATROPIUM-ALBUTEROL 0.5-2.5 (3) MG/3ML IN SOLN
3.0000 mL | Freq: Four times a day (QID) | RESPIRATORY_TRACT | Status: DC | PRN
  Administered 2020-05-26: 3 mL via RESPIRATORY_TRACT
  Filled 2020-05-21: qty 3

## 2020-05-21 MED ORDER — ACETAMINOPHEN 325 MG PO TABS: 650.0000 mg | ORAL_TABLET | Freq: Four times a day (QID) | ORAL | Status: DC | PRN

## 2020-05-21 MED ORDER — ALBUTEROL (5 MG/ML) CONTINUOUS INHALATION SOLN
10.0000 mg/h | INHALATION_SOLUTION | RESPIRATORY_TRACT | Status: DC
  Administered 2020-05-21: 10 mg/h via RESPIRATORY_TRACT
  Filled 2020-05-21: qty 20

## 2020-05-21 MED ORDER — IPRATROPIUM-ALBUTEROL 0.5-2.5 (3) MG/3ML IN SOLN
3.0000 mL | Freq: Four times a day (QID) | RESPIRATORY_TRACT | Status: DC
  Administered 2020-05-21 – 2020-05-29 (×32): 3 mL via RESPIRATORY_TRACT
  Filled 2020-05-21 (×32): qty 3

## 2020-05-21 MED ORDER — ASPIRIN EC 81 MG PO TBEC
81.0000 mg | DELAYED_RELEASE_TABLET | Freq: Every day | ORAL | Status: DC
  Administered 2020-05-21 – 2020-05-28 (×7): 81 mg via ORAL
  Filled 2020-05-21 (×7): qty 1

## 2020-05-21 MED ORDER — LEVOFLOXACIN 750 MG PO TABS
750.0000 mg | ORAL_TABLET | Freq: Every day | ORAL | Status: DC
  Administered 2020-05-22 – 2020-05-24 (×3): 750 mg via ORAL
  Filled 2020-05-21 (×3): qty 1

## 2020-05-21 MED ORDER — ALBUTEROL SULFATE (2.5 MG/3ML) 0.083% IN NEBU: 2.5000 mg | INHALATION_SOLUTION | RESPIRATORY_TRACT | Status: DC | PRN

## 2020-05-21 MED ORDER — HYDROCODONE-ACETAMINOPHEN 5-325 MG PO TABS: 1.0000 | ORAL_TABLET | ORAL | Status: DC | PRN

## 2020-05-21 MED ORDER — FUROSEMIDE 10 MG/ML IJ SOLN
40.0000 mg | Freq: Once | INTRAMUSCULAR | Status: AC
  Administered 2020-05-21: 40 mg via INTRAVENOUS
  Filled 2020-05-21: qty 4

## 2020-05-21 MED ORDER — DM-GUAIFENESIN ER 30-600 MG PO TB12
1.0000 | ORAL_TABLET | Freq: Two times a day (BID) | ORAL | Status: DC
  Administered 2020-05-21 – 2020-05-24 (×7): 1 via ORAL
  Filled 2020-05-21 (×7): qty 1

## 2020-05-21 NOTE — Assessment & Plan Note (Addendum)
continue PPI

## 2020-05-21 NOTE — Consult Note (Signed)
NAME:  [name], MRN:  [id number], DOB:  [birth year], LOS: 1 ADMISSION DATE:  05/20/2020, CONSULTATION DATE: 05/21/2020 REFERRING MD: [facility], CHIEF COMPLAINT: Increasing shortness of breath  Brief History   74 year old with acute on chronic hypoxia  History of present illness   74 year old female with extensive past medical history is followed by Dr. Chase Caller has not felt well since September 2020. Frequent exacerbations of respiratory treatment with multiple antimicrobial steroids O2 dependent at 2 L nocturnal oxygen today. Reports have increased wheezing shortness of breath denies chest pain. She has a cough that is nonproductive. She presented multiple times in the office and there was to be seen on 05/21/2019 office unfortunately the office while. Patient presented with emergency department for further evaluation and treatment. She was admitted by [facility] hospitalist team pulmonary critical care was asked to evaluate 05/21/2020.  Past Medical History  COPD Emphysema Breast cancer Bladder Hypertension Extremely hard of hearing  Significant Hospital Events   Patient 2021 admission  Consults:  87 2021 pulmonary critical care  Procedures:    Significant Diagnostic Tests:  05/21/2020 one 2D echo 8/7/202 TSH  Micro Data:    Antimicrobials:  Zithromax discontinued 05/21/2020 Levaquin  Interim history/subjective:  Admitted for increasing shortness of breath  Objective   Blood pressure (!) 129/91, pulse 87, temperature 98.1 F (36.7 C), temperature source Oral, resp. rate 20, height 5\' 6"  (1.676 m), weight 61 kg, SpO2 90 %.       No intake or output data in the 24 hours ending 05/21/20 0923 Filed Weights   05/20/20 1622  Weight: 61 kg    Examination: General: 74 year old female in no acute distress with vitals. HENT: No JVD is appreciated Lungs: Prominent expiratory Cardiovascular: Heart sounds regular rhythm Abdomen: Soft nontender Extremities: Warm dry no edema Neuro:  Grossly intact without focal defect   Resolved Hospital Problem list     Assessment & Plan:  Acute on chronic hypoxic respiratory failure in the setting of severe emphysema, COPD  ?Marland Kitchen Concern about heart failure  Nursingno recent 2D echo BNP is noted. O2 as needed Bronchodilators Steroids Consider aggressive diuresis 2D echo Angiolytics Consider sleep study for evaluation of sleep apnea.  Hypothyroidism Consider rechecking TSH labs 11/07/2018  Hypertension Per primary     Best practice:  Diet: Heart healthy Pain/Anxiety/Delirium protocol (if indicated): Currently on angiolytics VAP protocol (if indicated): Not indicated DVT prophylaxis: Low molecular weight heparin GI prophylaxis: PPI Glucose control: Not sedated Mobility: Bedrest Code Status: Full Family Communication: 05/21/2020 patient updated  at bedside Disposition: Admit to telemetry  Labs   CBC: Recent Labs  Lab 05/20/20 1657  WBC 7.7  NEUTROABS 6.9  HGB 14.7  HCT 44.5  MCV 92.7  PLT 430*    Basic Metabolic Panel: Recent Labs  Lab 05/20/20 1657 05/20/20 1855  NA 141  --   K 4.6  --   CL 103  --   CO2 26  --   GLUCOSE 123*  --   BUN 16  --   CREATININE 0.80  --   CALCIUM 9.4  --   MG  --  2.1   GFR: Estimated Creatinine Clearance: 56.9 mL/min (by C-G formula based on SCr of 0.8 mg/dL). Recent Labs  Lab 05/20/20 1657  WBC 7.7    Liver Function Tests: No results for input(s): AST, ALT, ALKPHOS, BILITOT, PROT, ALBUMIN in the last 168 hours. No results for input(s): LIPASE, AMYLASE in the last 168 hours. No results for input(s): AMMONIA  in the last 168 hours.  ABG    Component Value Date/Time   PHART 7.325 (L) 11/16/2016 1630   PCO2ART 53.9 (H) 11/16/2016 1630   PO2ART 57.2 (L) 11/16/2016 1630   HCO3 26.1 05/20/2020 2230   TCO2 30 06/15/2011 0357   ACIDBASEDEF 1.0 06/14/2011 1636   O2SAT 92.7 05/20/2020 2230     Coagulation Profile: No results for input(s): INR, PROTIME in the  last 168 hours.  Cardiac Enzymes: No results for input(s): CKTOTAL, CKMB, CKMBINDEX, TROPONINI in the last 168 hours.  HbA1C: Hgb A1c MFr Bld  Date/Time Value Ref Range Status  01/03/2018 04:01 PM 5.7 (H) <5.7 % of total Hgb Final    Comment:    For someone without known diabetes, a hemoglobin  A1c value between 5.7% and 6.4% is consistent with prediabetes and should be confirmed with a  follow-up test. . For someone with known diabetes, a value <7% indicates that their diabetes is well controlled. A1c targets should be individualized based on duration of diabetes, age, comorbid conditions, and other considerations. . This assay result is consistent with an increased risk of diabetes. . Currently, no consensus exists regarding use of hemoglobin A1c for diagnosis of diabetes for children. .     CBG: No results for input(s): GLUCAP in the last 168 hours.  Review of Systems:   10 point review of system taken, please see HPI for positives and negatives.   Past Medical History  She,  has a past medical history of Bilateral leg cramps, Bladder neoplasm, Chronic deep vein thrombosis (DVT) of right lower extremity ([place]), COPD, frequent exacerbations ([place]), Coronary artery disease, Dyspnea on minimal exertion, Emphysema/COPD ([place]), Family history of breast cancer, Family history of colon cancer, Family history of melanoma, Family history of ovarian cancer, Family history of pancreatic cancer, Fibromyalgia (1995), GERD (gastroesophageal reflux disease), Hiatal hernia, History of breast cancer, History of diverticulitis of colon, History of multiple pulmonary nodules, History of right breast cancer (oncologist-  dr Jana Hakim-- no recurrence), colonic polyps, Hyperlipidemia, Hypertension, Hypothyroidism, 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   Surgical History    Past Surgical History:  Procedure Laterality Date  . BREAST EXCISIONAL BIOPSY Left 10/15/2008  . BREAST LUMPECTOMY W/ NEEDLE LOCALIZATION Right 11-09-2008    dr Brantley Stage   [facility]  . CARDIAC CATHETERIZATION  03-12-2011   dr Aundra Dubin   70-80% ostial stenosis in small second diagonal (appears to small for intervention),  dLAD 40-50%,  minimal luminal irregulartieis involoving LCFx and RCA,  LVEF 55%  . CATARACT EXTRACTION W/ INTRAOCULAR LENS  IMPLANT, BILATERAL  2011  . CYSTOSCOPY N/A 03/11/2018   Procedure: CYSTOSCOPY WITH INSTILLATION OF POST OPERATIVE EPIRUBICIN;  Surgeon: Festus Aloe, MD;  Location: [facility];  Service: Urology;  Laterality: N/A;  . MASTECTOMY Right   . PARTIAL COLECTOMY  2002   sigmoid and appectomy (diverticulitis)  . PARTIAL MASTECTOMY WITH NEEDLE LOCALIZATION Left 02/23/2013   Procedure:  LEFT PARTIAL MASTECTOMY WITH NEEDLE LOCALIZATION;  Surgeon: Adin Hector, MD;  Location: [place];  Service: General;  Laterality: Left;  . RECONSTRUCTION BREAST W/ LATISSIMUS DORSI FLAP Right 05-30-2009   dr Harlow Mares  [facility]  . REDUCTION MAMMAPLASTY Left   .  SIMPLE MASTECTOMY Right 12-28-2008    dr Brantley Stage  [facility]  . TRANSTHORACIC ECHOCARDIOGRAM  07-09-2016  dr Aundra Dubin   ef 55-60%, grade 1 diastoic dysfunction/  mild TR  . TRANSURETHRAL RESECTION OF BLADDER TUMOR N/A 03/11/2018   Procedure: TRANSURETHRAL RESECTION OF BLADDER TUMOR (TURBT) 2-5cm;  Surgeon: Festus Aloe, MD;  Location: [facility];  Service: Urology;  Laterality: N/A;  . VAGINAL HYSTERECTOMY  1973  . VIDEO ASSISTED THORACOSCOPY (VATS)/WEDGE RESECTION Right 09-11-2005  &  06-14-2011   dr Fara Boros  [facility]   both RUL     Social History   reports that she quit  smoking about 4 years ago. Her smoking use included cigarettes. She has a 37.50 pack-year smoking history. She has never used smokeless tobacco. She reports current alcohol use of about 10.0 standard drinks of alcohol per week. She reports that she does not use drugs.   Family History   Her family history includes Breast cancer (age of onset: 78) in her niece; COPD in her mother; Cancer in her sister; Colon cancer in her mother; Coronary artery disease in her brother and father; Diabetes in her mother; Emphysema in her mother; Heart attack in her brother and father; Heart disease in her father; Hypertension in her mother, sister, and sister; Melanoma in her niece and sister; Ovarian cancer in her sister; Pancreatic cancer in her sister; Sudden death in her father; Throat cancer in her sister.   Allergies Allergies  Allergen Reactions  . Sulfa Antibiotics Swelling     Home Medications  Prior to Admission medications   Medication Sig Start Date End Date Taking? Authorizing Provider  albuterol (VENTOLIN HFA) 108 (90 Base) MCG/ACT inhaler Inhale 2 puffs into the lungs every 6 (six) hours as needed for wheezing or shortness of breath. 04/06/20  Yes Martyn Ehrich, NP  aspirin EC 81 MG tablet Take 81 mg by mouth daily.   Yes [provider]  atorvastatin (LIPITOR) 80 MG tablet TAKE 1 TABLET BY MOUTH EVERY DAY 03/23/20  Yes [name], NP  cilostazol (PLETAL) 100 MG tablet Take 1 tablet (100 mg total) by mouth 2 (two) times daily. 11/23/19  Yes [name], NP  dextromethorphan-guaiFENesin (MUCINEX DM) 30-600 MG 12hr tablet Take 1 tablet by mouth 2 (two) times daily.   Yes [provider]  fluticasone (FLONASE) 50 MCG/ACT nasal spray SPRAY 1 SPRAY INTO EACH NOSTRIL TWICE A DAY Patient taking differently: Place 1 spray into both nostrils 2 (two) times daily as needed for rhinitis.  04/15/20  Yes [place], [name], MD  Fluticasone-Umeclidin-Vilant (TRELEGY ELLIPTA) 200-62.5-25  MCG/INH AEPB Inhale 1 puff into the lungs daily. 04/25/20  Yes Martyn Ehrich, NP  ipratropium-albuterol (DUONEB) 0.5-2.5 (3) MG/3ML SOLN Inhale 3 mLs into the lungs every 6 (six) hours as needed (sob, wheezing). 04/25/20  Yes Martyn Ehrich, NP  levothyroxine (SYNTHROID) 112 MCG tablet TAKE 1 TABLET (112 MCG TOTAL) BY MOUTH DAILY BEFORE BREAKFAST. 12/21/19  Yes [place], [name], MD  LORazepam (ATIVAN) 2 MG tablet TAKE 1 TABLET BY MOUTH AT BEDTIME AS NEEDED FOR ANXIETY Patient taking differently: Take 2 mg by mouth at bedtime.  01/29/20  Yes [place], [name], MD  montelukast (SINGULAIR) 10 MG tablet Take 1 tablet (10 mg total) by mouth at bedtime. 04/29/20  Yes Martyn Ehrich, NP  omeprazole (PRILOSEC) 40 MG capsule TAKE 1 CAPSULE BY MOUTH TWICE A DAY Patient taking differently: Take 40 mg by mouth 2 (two) times daily.  12/21/19  Yes [place], [name], MD     Critical care time: 30 min     Richardson Landry Autry Droege ACNP Acute Care Nurse Practitioner [place] Please consult Amion 05/21/2020, 9:23 AM

## 2020-05-21 NOTE — Assessment & Plan Note (Signed)
-  Continue aspirin, Pletal, Lipitor

## 2020-05-21 NOTE — Assessment & Plan Note (Signed)
continue statin

## 2020-05-21 NOTE — Progress Notes (Signed)
PROGRESS NOTE    [name]   CXK:[id number]  DOB: 02/28/1946  DOA: 05/20/2020     1  PCP: [place], [name], MD  CC: SOB  Hospital Course: [name] is a 74 year old Caucasian female with PMH COPD (on 2 L chronic nasal cannula at home), ongoing grief/mourning due to son passing away 08-Aug-2019, hard of hearing, PVD, hypothyroidism, hypertension, hyperlipidemia, diverticulosis, CAD who presented to the hospital with worsening dyspnea at home.  She states that she has had multiple episodes over the past year of severe shortness of breath and has been on multiple rounds of steroids and antibiotics.  She associates much of her dyspnea due to stress episodes centered around grieving after her son passed away last 08-Aug-2023.  She became tearful while talking with me about this.  She states that she has not been on any medication nor met with a counselor since he passed away but states that she feels like she should be.  She further went on to endorse difficulty with sleeping at night (currently using trazodone and Ativan), still has interest in hobbies but too short of breath to do them, decreased energy, decreased concentration/ability to focus, decreased appetite with associated weight loss from this.  We discussed initiation of antidepressant given her significantly depressed mood which she was amenable for.  She was admitted for suspected COPD exacerbation.  She was started on steroids, azithromycin, and breathing treatments.  Pulmonology was consulted as well for further recommendations and evaluation.  She also was ordered an echo in setting of her worsening dyspnea and that she had also endorsed PND at home as well. EF was 45-50%, reduced from 60-65% in Oct 2020.   Interval History:  Patient resting in bed with obvious shortness of breath and was noted to have obvious tachypnea.  She also became immediately tearful when trying to tell me about her history of shortness of breath which has  tremendously worsened over the past year after her son passed away. She endorses waking up in the middle the night short of breath as well.  She does have a longstanding history of vascular disease.  She denies any significant swelling in her body and endorses weight loss due to poor appetite and not eating well. Understands that we are investigating other cardiac etiologies of her dyspnea but also treating for recurrent COPD exacerbation. Lastly, we had discussed initiation of SSRI to help with her depression/mood which she was very amenable to. She has no suicidal or homicidal ideation.  Old records reviewed in assessment of this patient  ROS: Constitutional: positive for fatigue and malaise, Respiratory: positive for cough, dyspnea on exertion and wheezing, Cardiovascular: negative for chest pain and Gastrointestinal: negative for abdominal pain  Assessment & Plan: Hypothyroidism - continue synthroid  Hyperlipidemia - continue statin  Essential hypertension - continue current regimen  GERD - continue PPI  COPD with acute exacerbation ([place]) - last seen by pulm outpatient 04/25/20; was given sample/Rx of Trelegy, Singulair added and recommended to consider Daliresp if ongoing AECOPD - follow up further pulm rec's while here - continue solumedrol, now on levaquin (azithro stopped), nebs - see CHF  Acute on chronic respiratory failure with hypoxia ([place]) - possibly multifactorial at this time in setting of COPD exac, anxiety, and possible component of superimposed heart failure - follow up response to lasix and COPD exac treatment - wean O2 as able; on 4L on admission up from baseline 2L  Chronic mesenteric ischemia (HCC) -Continue aspirin, Pletal,  Lipitor  CAD (coronary artery disease) -Continue aspirin, Lipitor  Acute systolic CHF (congestive heart failure) (HCC) -Mildly reduced EF on current echo, 40 to 45%, LV global hypokinesis, indeterminate filling pattern.  Previous  echo reviewed from October 2020 (EF 60 to 65%). -Patient does have underlying vascular disease with risk for developing CHF.  Other considered differential includes stress-induced cardiomyopathy from her severe grieving over the past year from the loss of her son -BNP borderline elevated 99 on admission and was 28, 3 years ago at last check; do not appreciate any obvious pleural effusions on CXR or CT chest.  There may be some very mild pulmonary edema on CXR however may also be confounded by underlying severe emphysema -Trial of Lasix per pulmonology, will follow up response -Troponin negative x2.  Will repeat again in a.m.  If any chest pain, repeat EKG -will likely reach out to cardiology tomorrow for further input depending on response to treatment today vs outpatient follow-up  Major depressive disorder, single episode, moderate degree ([name]) -Patient noted to be depressed on interview and anxious when giving collateral information regarding her dyspnea which she then progresses into talking about the death of her son in 08/09/2019.  He was diagnosed with alcoholic cirrhosis of the liver and developed further decompensation and ultimately passed away.  She has had a hard time dealing with grieving process since.  She states that she has not spoke to a counselor nor started medication but she is interested in medication at this time. -She does meet criteria for depression and initiation of medication at this time: Denies SI/HI, endorses decreased energy, difficulty with sleep at night (requiring trazodone and Ativan currently), decreased interest in doing hobbies due to severe dyspnea as well, decreased concentration/ability to focus, decreased appetite with associated weight loss -Given difficulty with sleep, will initiate treatment with Paxil and further adjust outpatient depending on patient response.  If does not tolerate Paxil, would then initiate alternative SSRI   Antimicrobials: Levaquin  05/21/2020>> current  DVT prophylaxis: Lovenox Code Status: Full Family Communication: None present Disposition Plan:  Status is: Inpatient  Remains inpatient appropriate because:Ongoing diagnostic testing needed not appropriate for outpatient work up, Unsafe d/c plan, IV treatments appropriate due to intensity of illness or inability to take PO and Inpatient level of care appropriate due to severity of illness   Dispo: The patient is from: Home              Anticipated d/c is to: Home              Anticipated d/c date is: 2 days              Patient currently is not medically stable to d/c.       Objective: Blood pressure 92/73, pulse 81, temperature 98 F (36.7 C), temperature source Oral, resp. rate 16, height '5\' 6"'  (1.676 m), weight 61 kg, SpO2 90 %.  Examination: General appearance: Adult woman resting in bed appearing mildly anxious and uncomfortable with fast breathing but no obvious distress Head: Normocephalic, without obvious abnormality, atraumatic Eyes: EOMI Lungs: Expiratory wheezing appreciated bilaterally, overall coarse breath sounds bilaterally Heart: regular rate and rhythm and S1, S2 normal Abdomen: normal findings: bowel sounds normal and soft, non-tender Extremities: No lower extremity edema Skin: mobility and turgor normal Neurologic: Grossly normal Psych: Normal affect, depressed mood  Consultants:   Pulmonology  Procedures:   TTE, 05/21/2020  Data Reviewed: I have personally reviewed following labs and imaging studies  Results for orders placed or performed during the hospital encounter of 05/20/20 (from the past 24 hour(s))  Basic metabolic panel     Status: Abnormal   Collection Time: 05/20/20  4:57 PM  Result Value Ref Range   Sodium 141 135 - 145 mmol/L   Potassium 4.6 3.5 - 5.1 mmol/L   Chloride 103 98 - 111 mmol/L   CO2 26 22 - 32 mmol/L   Glucose, Bld 123 (H) 70 - 99 mg/dL   BUN 16 8 - 23 mg/dL   Creatinine, Ser 0.80 0.44 - 1.00 mg/dL    Calcium 9.4 8.9 - 10.3 mg/dL   GFR calc non Af Amer >60 >60 mL/min   GFR calc Af Amer >60 >60 mL/min   Anion gap 12 5 - 15  CBC with Differential     Status: Abnormal   Collection Time: 05/20/20  4:57 PM  Result Value Ref Range   WBC 7.7 4.0 - 10.5 K/uL   RBC 4.80 3.87 - 5.11 MIL/uL   Hemoglobin 14.7 12.0 - 15.0 [name]/dL   HCT 44.5 36 - 46 %   MCV 92.7 80.0 - 100.0 fL   MCH 30.6 26.0 - 34.0 pg   MCHC 33.0 30.0 - 36.0 [name]/dL   RDW 14.1 11.5 - 15.5 %   Platelets 430 (H) 150 - 400 K/uL   nRBC 0.0 0.0 - 0.2 %   Neutrophils Relative % 89 %   Neutro Abs 6.9 1.7 - 7.7 K/uL   Lymphocytes Relative 6 %   Lymphs Abs 0.5 (L) 0.7 - 4.0 K/uL   Monocytes Relative 3 %   Monocytes Absolute 0.2 0 - 1 K/uL   Eosinophils Relative 0 %   Eosinophils Absolute 0.0 0 - 0 K/uL   Basophils Relative 1 %   Basophils Absolute 0.0 0 - 0 K/uL   Immature Granulocytes 1 %   Abs Immature Granulocytes 0.05 0.00 - 0.07 K/uL  Troponin I (High Sensitivity)     Status: None   Collection Time: 05/20/20  4:57 PM  Result Value Ref Range   Troponin I (High Sensitivity) 15 <18 ng/L  Brain natriuretic peptide     Status: None   Collection Time: 05/20/20  4:57 PM  Result Value Ref Range   B Natriuretic Peptide 99.7 0.0 - 100.0 pg/mL  SARS Coronavirus 2 by RT PCR (hospital order, performed in [place] hospital lab) Nasopharyngeal Nasopharyngeal Swab     Status: None   Collection Time: 05/20/20  4:57 PM   Specimen: Nasopharyngeal Swab  Result Value Ref Range   SARS Coronavirus 2 NEGATIVE NEGATIVE  Troponin I (High Sensitivity)     Status: None   Collection Time: 05/20/20  6:55 PM  Result Value Ref Range   Troponin I (High Sensitivity) 12 <18 ng/L  Magnesium     Status: None   Collection Time: 05/20/20  6:55 PM  Result Value Ref Range   Magnesium 2.1 1.7 - 2.4 mg/dL  Blood gas, venous (at [facility] and [facility], not at [facility])     Status: Abnormal   Collection Time: 05/20/20 10:30 PM  Result Value Ref Range   pH, Ven 7.390 7.25  - 7.43   pCO2, Ven 44.0 44 - 60 mmHg   pO2, Ven 67.5 (H) 32 - 45 mmHg   Bicarbonate 26.1 20.0 - 28.0 mmol/L   Acid-Base Excess 1.3 0.0 - 2.0 mmol/L   O2 Saturation 92.7 %   Patient temperature 98.6   Urinalysis, Routine w reflex  microscopic Urine, Clean Catch     Status: Abnormal   Collection Time: 05/21/20  7:49 AM  Result Value Ref Range   Color, Urine STRAW (A) YELLOW   APPearance CLEAR CLEAR   Specific Gravity, Urine 1.012 1.005 - 1.030   pH 6.0 5.0 - 8.0   Glucose, UA NEGATIVE NEGATIVE mg/dL   Hgb urine dipstick SMALL (A) NEGATIVE   Bilirubin Urine NEGATIVE NEGATIVE   Ketones, ur NEGATIVE NEGATIVE mg/dL   Protein, ur NEGATIVE NEGATIVE mg/dL   Nitrite NEGATIVE NEGATIVE   Leukocytes,Ua NEGATIVE NEGATIVE   RBC / HPF 0-5 0 - 5 RBC/hpf   Bacteria, UA RARE (A) NONE SEEN   Squamous Epithelial / LPF 0-5 0 - 5  Magnesium     Status: None   Collection Time: 05/21/20  9:19 AM  Result Value Ref Range   Magnesium 2.1 1.7 - 2.4 mg/dL  Phosphorus     Status: None   Collection Time: 05/21/20  9:19 AM  Result Value Ref Range   Phosphorus 3.6 2.5 - 4.6 mg/dL  TSH     Status: None   Collection Time: 05/21/20  9:19 AM  Result Value Ref Range   TSH 0.433 0.350 - 4.500 uIU/mL  Comprehensive metabolic panel     Status: Abnormal   Collection Time: 05/21/20  9:19 AM  Result Value Ref Range   Sodium 138 135 - 145 mmol/L   Potassium 4.1 3.5 - 5.1 mmol/L   Chloride 100 98 - 111 mmol/L   CO2 28 22 - 32 mmol/L   Glucose, Bld 156 (H) 70 - 99 mg/dL   BUN 15 8 - 23 mg/dL   Creatinine, Ser 0.74 0.44 - 1.00 mg/dL   Calcium 9.5 8.9 - 10.3 mg/dL   Total Protein 8.0 6.5 - 8.1 [name]/dL   Albumin 4.2 3.5 - 5.0 [name]/dL   AST 29 15 - 41 U/L   ALT 32 0 - 44 U/L   Alkaline Phosphatase 72 38 - 126 U/L   Total Bilirubin 0.3 0.3 - 1.2 mg/dL   GFR calc non Af Amer >60 >60 mL/min   GFR calc Af Amer >60 >60 mL/min   Anion gap 10 5 - 15    Recent Results (from the past 240 hour(s))  SARS Coronavirus 2 by RT PCR  (hospital order, performed in [place] hospital lab) Nasopharyngeal Nasopharyngeal Swab     Status: None   Collection Time: 05/20/20  4:57 PM   Specimen: Nasopharyngeal Swab  Result Value Ref Range Status   SARS Coronavirus 2 NEGATIVE NEGATIVE Final    Comment: (NOTE) SARS-CoV-2 target nucleic acids are NOT DETECTED.  The SARS-CoV-2 RNA is generally detectable in upper and lower respiratory specimens during the acute phase of infection. The lowest concentration of SARS-CoV-2 viral copies this assay can detect is 250 copies / mL. A negative result does not preclude SARS-CoV-2 infection and should not be used as the sole basis for treatment or other patient management decisions.  A negative result may occur with improper specimen collection / handling, submission of specimen other than nasopharyngeal swab, presence of viral mutation(s) within the areas targeted by this assay, and inadequate number of viral copies (<250 copies / mL). A negative result must be combined with clinical observations, patient history, and epidemiological information.  Fact Sheet for Patients:   StrictlyIdeas.no  Fact Sheet for Healthcare Providers: BankingDealers.co.za  This test is not yet approved or  cleared by the [place] FDA and has been authorized  for detection and/or diagnosis of SARS-CoV-2 by FDA under an Emergency Use Authorization (EUA).  This EUA will remain in effect (meaning this test can be used) for the duration of the COVID-19 declaration under Section 564(b)(1) of the Act, 21 U.S.C. section 360bbb-3(b)(1), unless the authorization is terminated or revoked sooner.  Performed at [facility], [place] [address], [place], [place] [address]      Radiology Studies: CT Angio Chest PE W and/or Wo Contrast  Result Date: 05/20/2020 CLINICAL DATA:  Shortness of breath x2 weeks. EXAM: CT ANGIOGRAPHY CHEST WITH CONTRAST  TECHNIQUE: Multidetector CT imaging of the chest was performed using the standard protocol during bolus administration of intravenous contrast. Multiplanar CT image reconstructions and MIPs were obtained to evaluate the vascular anatomy. CONTRAST:  120m OMNIPAQUE IOHEXOL 350 MG/ML SOLN COMPARISON:  December 01, 2019 FINDINGS: Cardiovascular: There is marked severity calcification of the aortic arch with an extensive amount of calcification seen within the origin of the left common carotid artery. Satisfactory opacification of the pulmonary arteries to the segmental level. No evidence of pulmonary embolism. Normal heart size. No pericardial effusion. Moderate severity coronary artery calcification is noted Mediastinum/Nodes: No enlarged mediastinal, hilar, or axillary lymph nodes. The thyroid gland and trachea demonstrate no significant findings. There is a large hiatal hernia. Lungs/Pleura: There is marked severity emphysematous lung disease. Ill-defined surgical sutures are seen along the anterior aspect of the right upper lobe. Mild linear scarring is seen within the posterior aspect of the left upper lobe. There is no evidence of acute infiltrate, pleural effusion or pneumothorax. Upper Abdomen: No acute abnormality. Musculoskeletal: A right breast implant is seen. Multilevel degenerative changes seen throughout the thoracic spine. Review of the MIP images confirms the above findings. IMPRESSION: 1. No evidence of pulmonary embolus. 2. Marked severity emphysematous lung disease. 3. Large hiatal hernia. Aortic Atherosclerosis (ICD10-I70.0) and Emphysema (ICD10-J43.9). Electronically Signed   By: TVirgina NorfolkM.D.   On: 05/20/2020 21:37   DG Chest Portable 1 View  Result Date: 05/20/2020 CLINICAL DATA:  Cough.  Shortness of breath. EXAM: PORTABLE CHEST 1 VIEW COMPARISON:  April 06, 2020 FINDINGS: Emphysematous changes are again noted. There are postsurgical changes of the right lung apex. Scattered areas of  pleuroparenchymal scarring are noted. The heart size is stable. There is no pneumothorax. No significant pleural effusion. IMPRESSION: No active disease. Electronically Signed   By: CConstance HolsterM.D.   On: 05/20/2020 18:02   ECHOCARDIOGRAM COMPLETE  Result Date: 05/21/2020    ECHOCARDIOGRAM REPORT   Patient Name:   JORPHIA MCTIGUEDate of Exam: 05/21/2020 Medical Rec #:  [id number]    Height:       66.0 in Accession #:    [id number]   Weight:       134.5 lb Date of Birth:  [birth year]    BSA:          1.689 m Patient Age:    89years      BP:           131/94 mmHg Patient Gender: F             HR:           81 bpm. Exam Location:  Inpatient Procedure: 2D Echo STAT ECHO Indications:    Dyspnea R06.00  History:        Patient has prior history of Echocardiogram examinations, most  recent 08/03/2019. CAD, COPD; Risk Factors:Hypertension and                 Dyslipidemia.  Sonographer:    Mikki Santee RDCS (AE) Referring Phys: [place]  Sonographer Comments: Technically difficult study due to poor echo windows. IMPRESSIONS  1. Left ventricular ejection fraction, by estimation, is 45 to 50%. The left ventricle has mildly decreased function. The left ventricle demonstrates global hypokinesis. Indeterminate diastolic filling due to E-A fusion. Definity contrast administered but images remain suboptimal to exclude regional wall motion abnormalities.  2. Right ventricular systolic function is normal. The right ventricular size is normal. Tricuspid regurgitation signal is inadequate for assessing PA pressure.  3. Left atrial size was mildly dilated.  4. The mitral valve is degenerative. Trivial mitral valve regurgitation. Mild mitral stenosis. The mean mitral valve gradient is 3.0 mmHg with average heart rate of 85 bpm.  5. The aortic valve is grossly normal. Aortic valve regurgitation is not visualized. No aortic stenosis is present.  6. The inferior vena cava is dilated in size with >50%  respiratory variability, suggesting right atrial pressure of 8 mmHg. Comparison(s): A prior study was performed on 08/03/2019. Prior images reviewed side by side. LVEF compared to prior appears reduced. FINDINGS  Left Ventricle: Left ventricular ejection fraction, by estimation, is 45 to 50%. The left ventricle has mildly decreased function. The left ventricle demonstrates global hypokinesis. Definity contrast agent was given IV to delineate the left ventricular  endocardial borders. The left ventricular internal cavity size was normal in size. There is no left ventricular hypertrophy. Indeterminate diastolic filling due to E-A fusion. Right Ventricle: The right ventricular size is normal. Right vetricular wall thickness was not assessed. Right ventricular systolic function is normal. Tricuspid regurgitation signal is inadequate for assessing PA pressure. Left Atrium: Left atrial size was mildly dilated. Right Atrium: Right atrial size was normal in size. Pericardium: There is no evidence of pericardial effusion. Mitral Valve: The mitral valve is degenerative in appearance. Normal mobility of the mitral valve leaflets. Mild to moderate mitral annular calcification. Trivial mitral valve regurgitation. Mild mitral valve stenosis. The mean mitral valve gradient is 3.0 mmHg with average heart rate of 85 bpm. Tricuspid Valve: The tricuspid valve is normal in structure. Tricuspid valve regurgitation is trivial. No evidence of tricuspid stenosis. Aortic Valve: The aortic valve is grossly normal. Aortic valve regurgitation is not visualized. No aortic stenosis is present. Pulmonic Valve: The pulmonic valve was normal in structure. Pulmonic valve regurgitation is not visualized. No evidence of pulmonic stenosis. Aorta: The aortic root is normal in size and structure. Venous: The inferior vena cava is dilated in size with greater than 50% respiratory variability, suggesting right atrial pressure of 8 mmHg. IAS/Shunts: No  atrial level shunt detected by color flow Doppler.  LEFT VENTRICLE PLAX 2D LVIDd:         4.30 cm  Diastology LVIDs:         3.00 cm  LV e' lateral:   5.92 cm/s LV PW:         1.00 cm  LV E/e' lateral: 12.4 LV IVS:        1.00 cm  LV e' medial:    4.88 cm/s LVOT diam:     2.10 cm  LV E/e' medial:  15.1 LV SV:         70 LV SV Index:   41 LVOT Area:     3.46 cm  RIGHT VENTRICLE RV S  prime:     15.30 cm/s TAPSE (M-mode): 2.7 cm LEFT ATRIUM             Index       RIGHT ATRIUM           Index LA diam:        3.90 cm 2.31 cm/m  RA Area:     13.80 cm LA Vol (A2C):   51.5 ml 30.48 ml/m RA Volume:   34.60 ml  20.48 ml/m LA Vol (A4C):   51.0 ml 30.19 ml/m LA Biplane Vol: 56.4 ml 33.38 ml/m  AORTIC VALVE LVOT Vmax:   99.80 cm/s LVOT Vmean:  62.300 cm/s LVOT VTI:    0.202 m  AORTA Ao Root diam: 3.00 cm MITRAL VALVE MV Area (PHT): 3.89 cm     SHUNTS MV Mean grad:  3.0 mmHg     Systemic VTI:  0.20 m MV Decel Time: 195 msec     Systemic Diam: 2.10 cm MV E velocity: 73.50 cm/s MV A velocity: 128.00 cm/s MV E/A ratio:  0.57 Cherlynn Kaiser MD Electronically signed by Cherlynn Kaiser MD Signature Date/Time: 05/21/2020/2:10:00 PM    Final    CT Angio Chest PE W and/or Wo Contrast  Final Result    DG Chest Portable 1 View  Final Result       Scheduled Meds: . aspirin EC  81 mg Oral Daily  . atorvastatin  80 mg Oral Daily  . cilostazol  100 mg Oral BID  . dextromethorphan-guaiFENesin  1 tablet Oral BID  . docusate sodium  100 mg Oral BID  . enoxaparin (LOVENOX) injection  40 mg Subcutaneous Q24H  . furosemide  40 mg Intravenous Once  . ipratropium-albuterol  3 mL Nebulization Q6H  . [START ON 05/22/2020] levofloxacin  750 mg Oral Daily  . levothyroxine  112 mcg Oral QAC breakfast  . LORazepam  2 mg Oral QHS  . methylPREDNISolone (SOLU-MEDROL) injection  60 mg Intravenous Q12H   Followed by  . [START ON 05/22/2020] predniSONE  40 mg Oral Q breakfast  . mometasone-formoterol  1 puff Inhalation BID  .  montelukast  10 mg Oral QHS  . pantoprazole  40 mg Oral Daily  . PARoxetine  20 mg Oral QHS  . sodium chloride flush  3 mL Intravenous Q12H  . sodium chloride flush  3 mL Intravenous Q12H   PRN Meds: acetaminophen **OR** acetaminophen, albuterol, HYDROcodone-acetaminophen, ipratropium-albuterol, ondansetron **OR** ondansetron (ZOFRAN) IV Continuous Infusions: . sodium chloride    . albuterol Stopped (05/21/20 1156)      LOS: 1 day  Time spent: Greater than 50% of the 35 minute visit was spent in counseling/coordination of care for the patient as laid out in the A&P.   Dwyane Dee, MD [facility] 05/21/2020, 4:25 PM   Contact via secure chat.  To contact the attending provider between 7A-7P or the covering provider during after hours 7P-7A, please log into the web site [email or web address] and access using universal [place] password for that web site. If you do not have the password, please call the hospital operator.

## 2020-05-21 NOTE — Hospital Course (Addendum)
[name] is a 74 year old Caucasian female with PMH COPD (on 2 L chronic nasal cannula at home), ongoing grief/mourning due to son passing away 2019-07-19, hard of hearing, PVD, hypothyroidism, hypertension, hyperlipidemia, diverticulosis, CAD who presented to the hospital with worsening dyspnea at home.  She states that she has had multiple episodes over the past year of severe shortness of breath and has been on multiple rounds of steroids and antibiotics.  She associates much of her dyspnea due to stress episodes centered around grieving after her son passed away last July 19, 2023.  She became tearful while talking with me about this.  She states that she has not been on any medication nor met with a counselor since he passed away but states that she feels like she should be.  She further went on to endorse difficulty with sleeping at night (currently using trazodone and Ativan), still has interest in hobbies but too short of breath to do them, decreased energy, decreased concentration/ability to focus, decreased appetite with associated weight loss from this.  We discussed initiation of antidepressant given her significantly depressed mood which she was amenable for.  She was admitted for suspected COPD exacerbation.  She was started on steroids, azithromycin, and breathing treatments.  Pulmonology was consulted as well for further recommendations and evaluation.  She also was ordered an echo in setting of her worsening dyspnea and that she had also endorsed PND at home as well. EF was 45-50%, reduced from 60-65% in Oct 2020.  The morning following admission she had improved overall. She stated she voided well and afterwards says that her breathing has slowly improved since admission.  She was continued on diuresis and in setting of her newly reduced EF, after discussion with patient, she wished for inpatient evaluation by cardiology.

## 2020-05-21 NOTE — Assessment & Plan Note (Signed)
-  continue synthroid 

## 2020-05-21 NOTE — Assessment & Plan Note (Addendum)
-  Mildly reduced EF on current echo, 40 to 45%, LV global hypokinesis, indeterminate filling pattern.  Previous echo reviewed from October 2020 (EF 60 to 65%). -Patient does have underlying vascular disease with risk for developing CHF.  Other considered differential includes stress-induced cardiomyopathy from her severe grieving over the past year from the loss of her son -BNP borderline elevated 99 on admission and was (28, 3 years ago) at last check; do not appreciate any obvious pleural effusions on CXR or CT chest.  There may be some very mild pulmonary edema on CXR however may also be confounded by underlying severe emphysema -Trial of Lasix per pulmonology, will follow up response: Urine output remains excellent and patient feels subjectively better -Troponin negative x2.  If any chest pain, repeat EKG - after further discussion with patient and husband bedside, she wishes for cardiology to evaluate further while she is hospitalized as opposed to outpatient. Consult placed - of note, Na level has downtrended some; if further diuresis, will need to watch this as well - further diuresis pending cardiology evaluation but overall diuresis seems to be improving her work of breathing

## 2020-05-21 NOTE — Assessment & Plan Note (Addendum)
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

## 2020-05-21 NOTE — Progress Notes (Signed)
  Echocardiogram 2D Echocardiogram has been performed.  Jennette Dubin 05/21/2020, 11:19 AM

## 2020-05-21 NOTE — Assessment & Plan Note (Addendum)
-  Patient noted to be depressed on interview and anxious when giving collateral information regarding her dyspnea which she then progresses into talking about the death of her son in 06/29/2019.  He was diagnosed with alcoholic cirrhosis of the liver and developed further decompensation and ultimately passed away.  She has had a hard time dealing with grieving process since.  She states that she has not spoke to a counselor nor started medication but she is interested in medication at this time. -She does meet criteria for depression and initiation of medication at this time: Denies SI/HI, endorses decreased energy, difficulty with sleep at night (requiring trazodone and Ativan currently), decreased interest in doing hobbies due to severe dyspnea as well, decreased concentration/ability to focus, decreased appetite with associated weight loss -Given difficulty with sleep, will initiate treatment with Paxil and further adjust outpatient depending on patient response.  If does not tolerate Paxil, would then initiate alternative SSRI - as of 8/9, she has been tolerating well and stating that her sleep has improved since being started as well

## 2020-05-21 NOTE — Assessment & Plan Note (Addendum)
-   possibly multifactorial at this time in setting of COPD exac, anxiety, and possible component of superimposed heart failure - follow up response to lasix and COPD exac treatment; patient endorses great improvement after diuresis and improvement in breathing - wean O2 as able; on 4L on admission up from baseline 2L

## 2020-05-21 NOTE — Assessment & Plan Note (Addendum)
continue current regimen.

## 2020-05-21 NOTE — Assessment & Plan Note (Signed)
-  Continue aspirin, Lipitor

## 2020-05-22 DIAGNOSIS — I5033 Acute on chronic diastolic (congestive) heart failure: Secondary | ICD-10-CM

## 2020-05-22 DIAGNOSIS — J810 Acute pulmonary edema: Secondary | ICD-10-CM

## 2020-05-22 DIAGNOSIS — J90 Pleural effusion, not elsewhere classified: Secondary | ICD-10-CM

## 2020-05-22 LAB — BASIC METABOLIC PANEL
Anion gap: 10 (ref 5–15)
BUN: 27 mg/dL — ABNORMAL HIGH (ref 8–23)
CO2: 28 mmol/L (ref 22–32)
Calcium: 9.2 mg/dL (ref 8.9–10.3)
Chloride: 98 mmol/L (ref 98–111)
Creatinine, Ser: 0.83 mg/dL (ref 0.44–1.00)
GFR calc Af Amer: >60 mL/min (ref >60)
GFR calc non Af Amer: >60 mL/min (ref >60)
Glucose, Bld: 141 mg/dL — ABNORMAL HIGH (ref 70–99)
Potassium: 4.2 mmol/L (ref 3.5–5.1)
Sodium: 136 mmol/L (ref 135–145)

## 2020-05-22 LAB — CBC WITH DIFFERENTIAL/PLATELET
Abs Immature Granulocytes: 0.05 10*3/uL (ref 0.00–0.07)
Basophils Absolute: 0 10*3/uL (ref 0.0–0.1)
Basophils Relative: 0 %
Eosinophils Absolute: 0 10*3/uL (ref 0.0–0.5)
Eosinophils Relative: 0 %
HCT: 43.3 % (ref 36.0–46.0)
Hemoglobin: 14.3 g/dL (ref 12.0–15.0)
Immature Granulocytes: 1 %
Lymphocytes Relative: 7 %
Lymphs Abs: 0.6 10*3/uL — ABNORMAL LOW (ref 0.7–4.0)
MCH: 30.8 pg (ref 26.0–34.0)
MCHC: 33 g/dL (ref 30.0–36.0)
MCV: 93.1 fL (ref 80.0–100.0)
Monocytes Absolute: 1.1 10*3/uL — ABNORMAL HIGH (ref 0.1–1.0)
Monocytes Relative: 13 %
Neutro Abs: 6.8 10*3/uL (ref 1.7–7.7)
Neutrophils Relative %: 79 %
Platelets: 409 10*3/uL — ABNORMAL HIGH (ref 150–400)
RBC: 4.65 MIL/uL (ref 3.87–5.11)
RDW: 14.2 % (ref 11.5–15.5)
WBC: 8.6 10*3/uL (ref 4.0–10.5)
nRBC: 0 % (ref 0.0–0.2)

## 2020-05-22 LAB — MAGNESIUM: Magnesium: 2.4 mg/dL (ref 1.7–2.4)

## 2020-05-22 MED ORDER — FUROSEMIDE 10 MG/ML IJ SOLN
40.0000 mg | Freq: Once | INTRAMUSCULAR | Status: AC
  Administered 2020-05-22: 40 mg via INTRAVENOUS
  Filled 2020-05-22: qty 4

## 2020-05-22 NOTE — Progress Notes (Signed)
PROGRESS NOTE    [name]   LEX:[id number]  DOB: [birth year]  DOA: 05/20/2020     2  PCP: [name], [name]  CC: SOB  Hospital Course: [name] is a 74 year old Caucasian female with PMH COPD (on 2 L chronic nasal cannula at home), ongoing grief/mourning due to son passing away 07-16-2019, hard of hearing, PVD, hypothyroidism, hypertension, hyperlipidemia, diverticulosis, CAD who presented to the hospital with worsening dyspnea at home.  She states that she has had multiple episodes over the past year of severe shortness of breath and has been on multiple rounds of steroids and antibiotics.  She associates much of her dyspnea due to stress episodes centered around grieving after her son passed away last July 16, 2023.  She became tearful while talking with me about this.  She states that she has not been on any medication nor met with a counselor since he passed away but states that she feels like she should be.  She further went on to endorse difficulty with sleeping at night (currently using trazodone and Ativan), still has interest in hobbies but too short of breath to do them, decreased energy, decreased concentration/ability to focus, decreased appetite with associated weight loss from this.  We discussed initiation of antidepressant given her significantly depressed mood which she was amenable for.  She was admitted for suspected COPD exacerbation.  She was started on steroids, azithromycin, and breathing treatments.  Pulmonology was consulted as well for further recommendations and evaluation.  She also was ordered an echo in setting of her worsening dyspnea and that she had also endorsed PND at home as well. EF was 45-50%, reduced from 60-65% in Oct 2020.  The morning following admission she had improved overall. She stated she voided well and afterwards says that her breathing has slowly improved since admission.    Interval History:  No acute events overnight.  Patient appeared  to look much better this morning and stated that she slept "amazing" last night.  Also states that she has urinated a large amounts and that has seemed to help her breathing also.  She is very thankful for feeling much better today and also states that her mood seems to be improved thanks to her just feeling better overall.  She does feel like the Paxil helped her sleep better also.  Old records reviewed in assessment of this patient  ROS: Constitutional: positive for fatigue and malaise, Respiratory: positive for cough, dyspnea on exertion and wheezing, Cardiovascular: negative for chest pain and Gastrointestinal: negative for abdominal pain  Assessment & Plan: Hypothyroidism - continue synthroid  Hyperlipidemia - continue statin  Essential hypertension - continue current regimen  GERD - continue PPI  COPD with acute exacerbation ([name]) - last seen by pulm outpatient 04/25/20; was given sample/Rx of Trelegy, Singulair added and recommended to consider Daliresp if ongoing AECOPD - follow up further pulm rec's while here - continue solumedrol, now on levaquin (azithro stopped), nebs - see CHF  Acute on chronic respiratory failure with hypoxia ([name]) - possibly multifactorial at this time in setting of COPD exac, anxiety, and possible component of superimposed heart failure - follow up response to lasix and COPD exac treatment; patient endorses great improvement after diuresis and improvement in breathing - wean O2 as able; on 4L on admission up from baseline 2L  Chronic mesenteric ischemia ([name]) -Continue aspirin, Pletal, Lipitor  CAD (coronary artery disease) -Continue aspirin, Lipitor  Acute systolic CHF (congestive heart failure) ([name]) -Mildly reduced EF  on current echo, 40 to 45%, LV global hypokinesis, indeterminate filling pattern.  Previous echo reviewed from October 2020 (EF 60 to 65%). -Patient does have underlying vascular disease with risk for developing CHF.  Other  considered differential includes stress-induced cardiomyopathy from her severe grieving over the past year from the loss of her son -BNP borderline elevated 99 on admission and was 28, 3 years ago at last check; do not appreciate any obvious pleural effusions on CXR or CT chest.  There may be some very mild pulmonary edema on CXR however may also be confounded by underlying severe emphysema -Trial of Lasix per pulmonology, will follow up response: Urine output remains excellent and patient feels subjectively better -Troponin negative x2.  Will repeat again in a.m.  If any chest pain, repeat EKG -Holding off on cardiology consult during hospitalization, will have patient follow-up outpatient unless worsens during hospitalization  Major depressive disorder, single episode, moderate degree ([name]) -Patient noted to be depressed on interview and anxious when giving collateral information regarding her dyspnea which she then progresses into talking about the death of her son in 07/26/2019.  He was diagnosed with alcoholic cirrhosis of the liver and developed further decompensation and ultimately passed away.  She has had a hard time dealing with grieving process since.  She states that she has not spoke to a counselor nor started medication but she is interested in medication at this time. -She does meet criteria for depression and initiation of medication at this time: Denies SI/HI, endorses decreased energy, difficulty with sleep at night (requiring trazodone and Ativan currently), decreased interest in doing hobbies due to severe dyspnea as well, decreased concentration/ability to focus, decreased appetite with associated weight loss -Given difficulty with sleep, will initiate treatment with Paxil and further adjust outpatient depending on patient response.  If does not tolerate Paxil, would then initiate alternative SSRI   Antimicrobials: Levaquin 05/21/2020>> current  DVT prophylaxis: Lovenox Code  Status: Full Family Communication: None present Disposition Plan:  Status is: Inpatient  Remains inpatient appropriate because:Ongoing diagnostic testing needed not appropriate for outpatient work up, Unsafe d/c plan, IV treatments appropriate due to intensity of illness or inability to take PO and Inpatient level of care appropriate due to severity of illness   Dispo: The patient is from: Home              Anticipated d/c is to: Home              Anticipated d/c date is: 2 days              Patient currently is not medically stable to d/c.  Objective: Blood pressure (!) 106/91, pulse 91, temperature 97.7 F (36.5 C), temperature source Oral, resp. rate (!) 21, height '5\' 6"'  (1.676 m), weight 61 kg, SpO2 91 %.  Examination: General appearance: Pleasant adult woman laying in bed appearing much more comfortable and is no longer tachypneic nor depressed/distressed/distraught appearing Head: Normocephalic, without obvious abnormality, atraumatic Eyes: EOMI Lungs: No further wheezing but lung sounds remain severely coarse bilaterally Heart: regular rate and rhythm and S1, S2 normal Abdomen: normal findings: bowel sounds normal and soft, non-tender Extremities: No lower extremity edema Skin: mobility and turgor normal Neurologic: Grossly normal Psych: Normal affect, appropriate mood now  Consultants:   Pulmonology  Procedures:   TTE, 05/21/2020  Data Reviewed: I have personally reviewed following labs and imaging studies Results for orders placed or performed during the hospital encounter of 05/20/20 (from the  past 24 hour(s))  Basic metabolic panel     Status: Abnormal   Collection Time: 05/22/20  5:40 AM  Result Value Ref Range   Sodium 136 135 - 145 mmol/L   Potassium 4.2 3.5 - 5.1 mmol/L   Chloride 98 98 - 111 mmol/L   CO2 28 22 - 32 mmol/L   Glucose, Bld 141 (H) 70 - 99 mg/dL   BUN 27 (H) 8 - 23 mg/dL   Creatinine, Ser 0.83 0.44 - 1.00 mg/dL   Calcium 9.2 8.9 - 10.3 mg/dL    GFR calc non Af Amer >60 >60 mL/min   GFR calc Af Amer >60 >60 mL/min   Anion gap 10 5 - 15  CBC with Differential/Platelet     Status: Abnormal   Collection Time: 05/22/20  5:40 AM  Result Value Ref Range   WBC 8.6 4.0 - 10.5 K/uL   RBC 4.65 3.87 - 5.11 MIL/uL   Hemoglobin 14.3 12.0 - 15.0 g/dL   HCT 43.3 36 - 46 %   MCV 93.1 80.0 - 100.0 fL   MCH 30.8 26.0 - 34.0 pg   MCHC 33.0 30.0 - 36.0 g/dL   RDW 14.2 11.5 - 15.5 %   Platelets 409 (H) 150 - 400 K/uL   nRBC 0.0 0.0 - 0.2 %   Neutrophils Relative % 79 %   Neutro Abs 6.8 1.7 - 7.7 K/uL   Lymphocytes Relative 7 %   Lymphs Abs 0.6 (L) 0.7 - 4.0 K/uL   Monocytes Relative 13 %   Monocytes Absolute 1.1 (H) 0 - 1 K/uL   Eosinophils Relative 0 %   Eosinophils Absolute 0.0 0 - 0 K/uL   Basophils Relative 0 %   Basophils Absolute 0.0 0 - 0 K/uL   Immature Granulocytes 1 %   Abs Immature Granulocytes 0.05 0.00 - 0.07 K/uL  Magnesium     Status: None   Collection Time: 05/22/20  5:40 AM  Result Value Ref Range   Magnesium 2.4 1.7 - 2.4 mg/dL    Recent Results (from the past 240 hour(s))  SARS Coronavirus 2 by RT PCR (hospital order, performed in [facility] hospital lab) Nasopharyngeal Nasopharyngeal Swab     Status: None   Collection Time: 05/20/20  4:57 PM   Specimen: Nasopharyngeal Swab  Result Value Ref Range Status   SARS Coronavirus 2 NEGATIVE NEGATIVE Final    Comment: (NOTE) SARS-CoV-2 target nucleic acids are NOT DETECTED.  The SARS-CoV-2 RNA is generally detectable in upper and lower respiratory specimens during the acute phase of infection. The lowest concentration of SARS-CoV-2 viral copies this assay can detect is 250 copies / mL. A negative result does not preclude SARS-CoV-2 infection and should not be used as the sole basis for treatment or other patient management decisions.  A negative result may occur with improper specimen collection / handling, submission of specimen other than nasopharyngeal swab,  presence of viral mutation(s) within the areas targeted by this assay, and inadequate number of viral copies (<250 copies / mL). A negative result must be combined with clinical observations, patient history, and epidemiological information.  Fact Sheet for Patients:   StrictlyIdeas.no  Fact Sheet for Healthcare Providers: BankingDealers.co.za  This test is not yet approved or  cleared by the [place] FDA and has been authorized for detection and/or diagnosis of SARS-CoV-2 by FDA under an Emergency Use Authorization (EUA).  This EUA will remain in effect (meaning this test can be used) for the duration  of the COVID-19 declaration under Section 564(b)(1) of the Act, 21 U.S.C. section 360bbb-3(b)(1), unless the authorization is terminated or revoked sooner.  Performed at [facility], [place] [address] [place], [place] [address]   Respiratory Panel by PCR     Status: None   Collection Time: 05/21/20  7:27 AM   Specimen: Nasopharyngeal Swab; Respiratory  Result Value Ref Range Status   Adenovirus NOT DETECTED NOT DETECTED Final   Coronavirus 229E NOT DETECTED NOT DETECTED Final    Comment: (NOTE) The Coronavirus on the Respiratory Panel, DOES NOT test for the novel  Coronavirus (2019 nCoV)    Coronavirus HKU1 NOT DETECTED NOT DETECTED Final   Coronavirus NL63 NOT DETECTED NOT DETECTED Final   Coronavirus OC43 NOT DETECTED NOT DETECTED Final   Metapneumovirus NOT DETECTED NOT DETECTED Final   Rhinovirus / Enterovirus NOT DETECTED NOT DETECTED Final   Influenza A NOT DETECTED NOT DETECTED Final   Influenza B NOT DETECTED NOT DETECTED Final   Parainfluenza Virus 1 NOT DETECTED NOT DETECTED Final   Parainfluenza Virus 2 NOT DETECTED NOT DETECTED Final   Parainfluenza Virus 3 NOT DETECTED NOT DETECTED Final   Parainfluenza Virus 4 NOT DETECTED NOT DETECTED Final   Respiratory Syncytial Virus NOT DETECTED NOT  DETECTED Final   Bordetella pertussis NOT DETECTED NOT DETECTED Final   Chlamydophila pneumoniae NOT DETECTED NOT DETECTED Final   Mycoplasma pneumoniae NOT DETECTED NOT DETECTED Final    Comment: Performed at [facility], [place]. [address], [place], [place] [address]     Radiology Studies: CT Angio Chest PE W and/or Wo Contrast  Result Date: 05/20/2020 CLINICAL DATA:  Shortness of breath x2 weeks. EXAM: CT ANGIOGRAPHY CHEST WITH CONTRAST TECHNIQUE: Multidetector CT imaging of the chest was performed using the standard protocol during bolus administration of intravenous contrast. Multiplanar CT image reconstructions and MIPs were obtained to evaluate the vascular anatomy. CONTRAST:  174m OMNIPAQUE IOHEXOL 350 MG/ML SOLN COMPARISON:  December 01, 2019 FINDINGS: Cardiovascular: There is marked severity calcification of the aortic arch with an extensive amount of calcification seen within the origin of the left common carotid artery. Satisfactory opacification of the pulmonary arteries to the segmental level. No evidence of pulmonary embolism. Normal heart size. No pericardial effusion. Moderate severity coronary artery calcification is noted Mediastinum/Nodes: No enlarged mediastinal, hilar, or axillary lymph nodes. The thyroid gland and trachea demonstrate no significant findings. There is a large hiatal hernia. Lungs/Pleura: There is marked severity emphysematous lung disease. Ill-defined surgical sutures are seen along the anterior aspect of the right upper lobe. Mild linear scarring is seen within the posterior aspect of the left upper lobe. There is no evidence of acute infiltrate, pleural effusion or pneumothorax. Upper Abdomen: No acute abnormality. Musculoskeletal: A right breast implant is seen. Multilevel degenerative changes seen throughout the thoracic spine. Review of the MIP images confirms the above findings. IMPRESSION: 1. No evidence of pulmonary embolus. 2. Marked severity emphysematous  lung disease. 3. Large hiatal hernia. Aortic Atherosclerosis (ICD10-I70.0) and Emphysema (ICD10-J43.9). Electronically Signed   By: TVirgina NorfolkM.D.   On: 05/20/2020 21:37   DG Chest Portable 1 View  Result Date: 05/20/2020 CLINICAL DATA:  Cough.  Shortness of breath. EXAM: PORTABLE CHEST 1 VIEW COMPARISON:  April 06, 2020 FINDINGS: Emphysematous changes are again noted. There are postsurgical changes of the right lung apex. Scattered areas of pleuroparenchymal scarring are noted. The heart size is stable. There is no pneumothorax. No significant pleural effusion. IMPRESSION:  No active disease. Electronically Signed   By: Constance Holster M.D.   On: 05/20/2020 18:02   ECHOCARDIOGRAM COMPLETE  Result Date: 05/21/2020    ECHOCARDIOGRAM REPORT   Patient Name:   AISIA CORREIRA Date of Exam: 05/21/2020 Medical Rec #:  [id number]     Height:       66.0 in Accession #:    [id number]    Weight:       134.5 lb Date of Birth:  1946/07/02     BSA:          1.689 m Patient Age:    62 years      BP:           131/94 mmHg Patient Gender: F             HR:           81 bpm. Exam Location:  Inpatient Procedure: 2D Echo STAT ECHO Indications:    Dyspnea R06.00  History:        Patient has prior history of Echocardiogram examinations, most                 recent 08/03/2019. CAD, COPD; Risk Factors:Hypertension and                 Dyslipidemia.  Sonographer:    Mikki Santee RDCS (AE) Referring Phys: [place]  Sonographer Comments: Technically difficult study due to poor echo windows. IMPRESSIONS  1. Left ventricular ejection fraction, by estimation, is 45 to 50%. The left ventricle has mildly decreased function. The left ventricle demonstrates global hypokinesis. Indeterminate diastolic filling due to E-A fusion. Definity contrast administered but images remain suboptimal to exclude regional wall motion abnormalities.  2. Right ventricular systolic function is normal. The right ventricular size is normal.  Tricuspid regurgitation signal is inadequate for assessing PA pressure.  3. Left atrial size was mildly dilated.  4. The mitral valve is degenerative. Trivial mitral valve regurgitation. Mild mitral stenosis. The mean mitral valve gradient is 3.0 mmHg with average heart rate of 85 bpm.  5. The aortic valve is grossly normal. Aortic valve regurgitation is not visualized. No aortic stenosis is present.  6. The inferior vena cava is dilated in size with >50% respiratory variability, suggesting right atrial pressure of 8 mmHg. Comparison(s): A prior study was performed on 08/03/2019. Prior images reviewed side by side. LVEF compared to prior appears reduced. FINDINGS  Left Ventricle: Left ventricular ejection fraction, by estimation, is 45 to 50%. The left ventricle has mildly decreased function. The left ventricle demonstrates global hypokinesis. Definity contrast agent was given IV to delineate the left ventricular  endocardial borders. The left ventricular internal cavity size was normal in size. There is no left ventricular hypertrophy. Indeterminate diastolic filling due to E-A fusion. Right Ventricle: The right ventricular size is normal. Right vetricular wall thickness was not assessed. Right ventricular systolic function is normal. Tricuspid regurgitation signal is inadequate for assessing PA pressure. Left Atrium: Left atrial size was mildly dilated. Right Atrium: Right atrial size was normal in size. Pericardium: There is no evidence of pericardial effusion. Mitral Valve: The mitral valve is degenerative in appearance. Normal mobility of the mitral valve leaflets. Mild to moderate mitral annular calcification. Trivial mitral valve regurgitation. Mild mitral valve stenosis. The mean mitral valve gradient is 3.0 mmHg with average heart rate of 85 bpm. Tricuspid Valve: The tricuspid valve is normal in structure. Tricuspid valve regurgitation is trivial. No evidence of tricuspid  stenosis. Aortic Valve: The aortic  valve is grossly normal. Aortic valve regurgitation is not visualized. No aortic stenosis is present. Pulmonic Valve: The pulmonic valve was normal in structure. Pulmonic valve regurgitation is not visualized. No evidence of pulmonic stenosis. Aorta: The aortic root is normal in size and structure. Venous: The inferior vena cava is dilated in size with greater than 50% respiratory variability, suggesting right atrial pressure of 8 mmHg. IAS/Shunts: No atrial level shunt detected by color flow Doppler.  LEFT VENTRICLE PLAX 2D LVIDd:         4.30 cm  Diastology LVIDs:         3.00 cm  LV e' lateral:   5.92 cm/s LV PW:         1.00 cm  LV E/e' lateral: 12.4 LV IVS:        1.00 cm  LV e' medial:    4.88 cm/s LVOT diam:     2.10 cm  LV E/e' medial:  15.1 LV SV:         70 LV SV Index:   41 LVOT Area:     3.46 cm  RIGHT VENTRICLE RV S prime:     15.30 cm/s TAPSE (M-mode): 2.7 cm LEFT ATRIUM             Index       RIGHT ATRIUM           Index LA diam:        3.90 cm 2.31 cm/m  RA Area:     13.80 cm LA Vol (A2C):   51.5 ml 30.48 ml/m RA Volume:   34.60 ml  20.48 ml/m LA Vol (A4C):   51.0 ml 30.19 ml/m LA Biplane Vol: 56.4 ml 33.38 ml/m  AORTIC VALVE LVOT Vmax:   99.80 cm/s LVOT Vmean:  62.300 cm/s LVOT VTI:    0.202 m  AORTA Ao Root diam: 3.00 cm MITRAL VALVE MV Area (PHT): 3.89 cm     SHUNTS MV Mean grad:  3.0 mmHg     Systemic VTI:  0.20 m MV Decel Time: 195 msec     Systemic Diam: 2.10 cm MV E velocity: 73.50 cm/s MV A velocity: 128.00 cm/s MV E/A ratio:  0.57 Cherlynn Kaiser [name] Electronically signed by Cherlynn Kaiser [name] Signature Date/Time: 05/21/2020/2:10:00 PM    Final    CT Angio Chest PE W and/or Wo Contrast  Final Result    DG Chest Portable 1 View  Final Result       Scheduled Meds: . aspirin EC  81 mg Oral Daily  . atorvastatin  80 mg Oral Daily  . cilostazol  100 mg Oral BID  . dextromethorphan-guaiFENesin  1 tablet Oral BID  . docusate sodium  100 mg Oral BID  . enoxaparin (LOVENOX)  injection  40 mg Subcutaneous Q24H  . feeding supplement (ENSURE ENLIVE)  237 mL Oral BID BM  . furosemide  40 mg Intravenous Once  . ipratropium-albuterol  3 mL Nebulization Q6H  . levofloxacin  750 mg Oral Daily  . levothyroxine  112 mcg Oral Q0600  . LORazepam  2 mg Oral QHS  . mometasone-formoterol  1 puff Inhalation BID  . montelukast  10 mg Oral QHS  . pantoprazole  40 mg Oral Daily  . PARoxetine  20 mg Oral QHS  . predniSONE  40 mg Oral Q breakfast  . sodium chloride flush  3 mL Intravenous Q12H  . sodium chloride flush  3 mL Intravenous Q12H  PRN Meds: acetaminophen **OR** acetaminophen, albuterol, HYDROcodone-acetaminophen, ipratropium-albuterol, ondansetron **OR** ondansetron (ZOFRAN) IV Continuous Infusions:     LOS: 2 days  Time spent: Greater than 50% of the 35 minute visit was spent in counseling/coordination of care for the patient as laid out in the A&P.   Dwyane Dee, [name] [facility] 05/22/2020, 12:21 PM   Contact via secure chat.  To contact the attending provider between 7A-7P or the covering provider during after hours 7P-7A, please log into the web site [email or web address] and access using universal [employer] password for that web site. If you do not have the password, please call the hospital operator.

## 2020-05-22 NOTE — Evaluation (Signed)
Physical Therapy Evaluation Patient Details Name: [name] MRN: [id number] DOB: 1945/10/22 Today's Date: 05/22/2020   History of Present Illness  [name] is a 74 year old Caucasian female with PMH COPD (on 2 L chronic nasal cannula at home), ongoing grief/mourning due to son passing away July 16, 2019, hard of hearing, PVD, hypothyroidism, hypertension, hyperlipidemia, diverticulosis, CAD who presented to the hospital with worsening dyspnea at home.  She states that she has had multiple episodes over the past year of severe shortness of breath and has been on multiple rounds of steroids and antibiotics.  She associates much of her dyspnea due to stress episodes centered around grieving after her son passed away last 16-Jul-2023  Clinical Impression  Pt admitted with above diagnosis.  Pt  Very pleasant and agreeable to activity. Pt is quite independent at her baseline. Pt amb short hallway distance on 3L O2, desats after activity. Reinforced energy conservation. Discussed pt will need to gradually incr activity,  limit trips up/down the 16 stairs initially, etc. Will benefit from HHPT at d/c. Continue to follow in acute setting Pt currently with functional limitations due to the deficits listed below (see PT Problem List). Pt will benefit from skilled PT to increase their independence and safety with mobility to allow discharge to the venue listed below.       Follow Up Recommendations Home health PT    Equipment Recommendations  None recommended by PT    Recommendations for Other Services       Precautions / Restrictions Precautions Precautions: Fall Precaution Comments: watch oxygen sats. 2L at baseline, 2.5-3L currently Restrictions Weight Bearing Restrictions: No      Mobility  Bed Mobility               General bed mobility comments: pt OOB in recliner  Transfers Overall transfer level: Needs assistance Equipment used: Rolling walker (2 wheeled) Transfers: Sit  to/from Stand Sit to Stand: Min guard Stand pivot transfers: Min guard       General transfer comment: for safety and line management  Ambulation/Gait Ambulation/Gait assistance: Min guard Gait Distance (Feet): 70 Feet Assistive device: None Gait Pattern/deviations: Step-through pattern;Decreased stride length;Trunk flexed     General Gait Details: min/guard for safety, unsteady gait however no overt LOB. 1 brief standing rest. SpO2= 91-94% during amb on 3L, desats to 84% after activity and recovers to 90% after 2 minutes  Stairs            Wheelchair Mobility    Modified Rankin (Stroke Patients Only)       Balance Overall balance assessment: Mild deficits observed, not formally tested                                           Pertinent Vitals/Pain Pain Assessment: No/denies pain    Home Living Family/patient expects to be discharged to:: Private residence Living Arrangements: Spouse/significant other Available Help at Discharge: Family;Available 24 hours/day Type of Home: House       Home Layout: Two level;1/2 bath on main level Home Equipment: Cane - single point      Prior Function Level of Independence: Independent         Comments: pt reports she is quite active and independent at her baseline     Hand Dominance        Extremity/Trunk Assessment   Upper Extremity Assessment Upper Extremity Assessment:  Generalized weakness;Defer to OT evaluation    Lower Extremity Assessment Lower Extremity Assessment: Generalized weakness       Communication   Communication: HOH  Cognition Arousal/Alertness: Awake/alert Behavior During Therapy: WFL for tasks assessed/performed Overall Cognitive Status: Within Functional Limits for tasks assessed                                        General Comments      Exercises Other Exercises Other Exercises: reviewed trunk extension/diaphragmatic and pursed lip  breathing   Assessment/Plan    PT Assessment Patient needs continued PT services  PT Problem List Decreased mobility;Cardiopulmonary status limiting activity;Decreased activity tolerance       PT Treatment Interventions DME instruction;Therapeutic exercise;Gait training;Functional mobility training;Therapeutic activities;Patient/family education    PT Goals (Current goals can be found in the Care Plan section)  Acute Rehab PT Goals Patient Stated Goal: home PT Goal Formulation: With patient Time For Goal Achievement: 06/05/20 Potential to Achieve Goals: Good    Frequency Min 3X/week   Barriers to discharge        Co-evaluation               AM-PAC PT "6 Clicks" Mobility  Outcome Measure Help needed turning from your back to your side while in a flat bed without using bedrails?: A Little Help needed moving from lying on your back to sitting on the side of a flat bed without using bedrails?: A Little Help needed moving to and from a bed to a chair (including a wheelchair)?: A Little Help needed standing up from a chair using your arms (e.g., wheelchair or bedside chair)?: A Little Help needed to walk in hospital room?: A Little Help needed climbing 3-5 steps with a railing? : A Lot 6 Click Score: 17    End of Session Equipment Utilized During Treatment: Gait belt Activity Tolerance: Patient tolerated treatment well;Patient limited by fatigue Patient left: in chair;with call bell/phone within reach;with chair alarm set   PT Visit Diagnosis: Difficulty in walking, not elsewhere classified (R26.2)    Time: 5188-4166 PT Time Calculation (min) (ACUTE ONLY): 19 min   Charges:   PT Evaluation $PT Eval Low Complexity: [place], PT  Acute Rehab Dept ([place]) [phone] Pager [phone]  05/22/2020   [facility] 05/22/2020, 2:34 PM

## 2020-05-22 NOTE — Evaluation (Signed)
Occupational Therapy Evaluation Patient Details Name: [name] MRN: [id number] DOB: August 13, 1946 Today's Date: 05/22/2020    History of Present Illness [name] is a 74 year old Caucasian female with PMH COPD (on 2 L chronic nasal cannula at home), ongoing grief/mourning due to son passing away 2019-06-30, hard of hearing, PVD, hypothyroidism, hypertension, hyperlipidemia, diverticulosis, CAD who presented to the hospital with worsening dyspnea at home.  She states that she has had multiple episodes over the past year of severe shortness of breath and has been on multiple rounds of steroids and antibiotics.  She associates much of her dyspnea due to stress episodes centered around grieving after her son passed away last 2023/06/30   Clinical Impression   Pt admitted with the above. Pt currently with functional limitations due to the deficits listed below (see OT Problem List).  Pt will benefit from skilled OT to increase their safety and independence with ADL and functional mobility for ADL to facilitate discharge to venue listed below.      Follow Up Recommendations  Supervision - Intermittent    Equipment Recommendations  None recommended by OT    Recommendations for Other Services       Precautions / Restrictions Precautions Precautions: Fall Precaution Comments: watch oxygen sats. 2L      Mobility Bed Mobility               General bed mobility comments: pt OOB  Transfers Overall transfer level: Needs assistance Equipment used: Rolling walker (2 wheeled) Transfers: Sit to/from [employer] Sit to Stand: Min guard Stand pivot transfers: Min guard            Balance Overall balance assessment: Mild deficits observed, not formally tested                                         ADL either performed or assessed with clinical judgement   ADL Overall ADL's : Needs assistance/impaired Eating/Feeding: Set up;Sitting    Grooming: Wash/dry face;Standing;Min guard   Upper Body Bathing: Set up;Sitting   Lower Body Bathing: Minimal assistance;Sit to/from stand;Cueing for sequencing;Cueing for compensatory techniques   Upper Body Dressing : Set up;Sitting   Lower Body Dressing: Minimal assistance;Sit to/from stand;Cueing for safety;Cueing for sequencing   Toilet Transfer: Minimal assistance;Ambulation;Cueing for sequencing;RW   Toileting- Clothing Manipulation and Hygiene: Minimal assistance;Sit to/from stand;Cueing for safety;Cueing for sequencing         General ADL Comments: energy conservation handout provided and gone over in detail.  Pts oxygen 88-92 on 2L.  Pts husband provides A as needed.  Pt very pleasant     Vision Patient Visual Report: No change from baseline              Pertinent Vitals/Pain Pain Assessment: No/denies pain     Hand Dominance     Extremity/Trunk Assessment Upper Extremity Assessment Upper Extremity Assessment: Generalized weakness           Communication Communication Communication: HOH   Cognition Arousal/Alertness: Awake/alert Behavior During Therapy: WFL for tasks assessed/performed Overall Cognitive Status: Within Functional Limits for tasks assessed                                                Home Living Family/patient  expects to be discharged to:: Private residence   Available Help at Discharge: Family;Available 24 hours/day Type of Home: House       Home Layout: Two level;1/2 bath on main level Alternate Level Stairs-Number of Steps: flight   Bathroom Shower/Tub: Occupational psychologist: Standard     Home Equipment: Cane - single point          Prior Functioning/Environment Level of Independence: Independent with assistive device(s)                 OT Problem List: Decreased strength;Decreased activity tolerance;Cardiopulmonary status limiting activity      OT Treatment/Interventions:  Self-care/ADL training;Therapeutic activities;Patient/family education;Energy conservation    OT Goals(Current goals can be found in the care plan section) Acute Rehab OT Goals Patient Stated Goal: home OT Goal Formulation: With patient Time For Goal Achievement: 06/05/20 Potential to Achieve Goals: Good  OT Frequency: Min 2X/week    AM-PAC OT "6 Clicks" Daily Activity     Outcome Measure Help from another person eating meals?: None Help from another person taking care of personal grooming?: A Little Help from another person toileting, which includes using toliet, bedpan, or urinal?: A Little Help from another person bathing (including washing, rinsing, drying)?: A Little Help from another person to put on and taking off regular upper body clothing?: A Little Help from another person to put on and taking off regular lower body clothing?: A Little 6 Click Score: 19   End of Session Nurse Communication: Mobility status  Activity Tolerance: Patient tolerated treatment well Patient left: in chair;with call bell/phone within reach  OT Visit Diagnosis: Unsteadiness on feet (R26.81);Muscle weakness (generalized) (M62.81);Other abnormalities of gait and mobility (R26.89)                Time: 7681-1572 OT Time Calculation (min): 22 min Charges:  OT General Charges $OT Visit: 1 Visit OT Evaluation $OT Eval Moderate Complexity: 1 Mod  Kari Baars, [place] [phone] Office- [phone]     Brylie Sneath, Edwena Felty D 05/22/2020, 1:37 PM

## 2020-05-22 NOTE — Consult Note (Signed)
NAME:  [name], MRN:  [id number], DOB:  October 09, 1946, LOS: 2 ADMISSION DATE:  05/20/2020, CONSULTATION DATE: 05/21/2020 REFERRING MD: [facility], CHIEF COMPLAINT: Increasing shortness of breath  Brief History   74 year old with acute on chronic hypoxia  History of present illness   74 year old female with extensive past medical history is followed by Dr. Chase Caller has not felt well since September 2020. Frequent exacerbations of respiratory treatment with multiple antimicrobial steroids O2 dependent at 2 L nocturnal oxygen today. Reports have increased wheezing shortness of breath denies chest pain. She has a cough that is nonproductive. She presented multiple times in the office and there was to be seen on 05/21/2019 office unfortunately the office while. Patient presented with emergency department for further evaluation and treatment. She was admitted by [facility] hospitalist team pulmonary critical care was asked to evaluate 05/21/2020.  Past Medical History  COPD Emphysema Breast cancer Bladder Hypertension Extremely hard of hearing  Significant Hospital Events     Consults:  87 2021 pulmonary critical care  Procedures:    Significant Diagnostic Tests:  05/21/2020 one 2D echo 8/7/202 TSH  Micro Data:    Antimicrobials:  Zithromax discontinued 05/21/2020 Levaquin  Interim history/subjective:  This morning feeling better, like she can take a deep breath. Had large volume of unrecorded UOP and felt like lasix may have helped. Still coughing.  Objective   Blood pressure (!) 106/91, pulse 91, temperature 97.7 F (36.5 C), temperature source Oral, resp. rate (!) 21, height 5\' 6"  (1.676 m), weight 61 kg, SpO2 91 %.        Intake/Output Summary (Last 24 hours) at 05/22/2020 1727 Last data filed at 05/22/2020 1409 Gross per 24 hour  Intake 820 ml  Output 2050 ml  Net -1230 ml   Filed Weights   05/20/20 1622  Weight: 61 kg    Examination: General: Elderly woman sitting up in  bedside chair in no acute distress HENT: [place]/AT, eyes anicteric Lungs: Bilateral expiratory wheezing persistent but improved, coughing frequently with deep inhalation.  Speaking in full sentences.  Remains on supplemental oxygen. Cardiovascular: Regular rate and rhythm Abdomen: Soft, nontender, nondistended Extremities: Warm and dry, no rashes Neuro: Awake and alert, answering questions appropriately, moving all extremities spontaneously.  Sitting forward easily without assistance.   Resolved Hospital Problem list     Assessment & Plan:  Acute on chronic hypoxic respiratory failure in the setting of COPD and acute pulmonary edema with bilateral pleural effusions.  -Supplemental oxygen as needed.  Remain greater than 90%.-Lasix -Bronchodilators -Needs LABA-LAMA-ICS at discharge -Steroids levofloxacin for 5 days -Should be back on room air during the day prior to discharge; on home supplemental oxygen at night  Acute HFpEF, hypokinesis of LV, lateral pleural effusions acute pulmonary edema -Consider cardiology evaluation -Diuresis  Hypertension Per primary     Best practice:  Per primary  Labs   CBC: Recent Labs  Lab 05/20/20 1657 05/22/20 0540  WBC 7.7 8.6  NEUTROABS 6.9 6.8  HGB 14.7 14.3  HCT 44.5 43.3  MCV 92.7 93.1  PLT 430* 409*    Basic Metabolic Panel: Recent Labs  Lab 05/20/20 1657 05/20/20 1855 05/21/20 0919 05/22/20 0540  NA 141  --  138 136  K 4.6  --  4.1 4.2  CL 103  --  100 98  CO2 26  --  28 28  GLUCOSE 123*  --  156* 141*  BUN 16  --  15 27*  CREATININE 0.80  --  0.74  0.83  CALCIUM 9.4  --  9.5 9.2  MG  --  2.1 2.1 2.4  PHOS  --   --  3.6  --    GFR: Estimated Creatinine Clearance: 54.8 mL/min (by C-G formula based on SCr of 0.83 mg/dL). Recent Labs  Lab 05/20/20 1657 05/22/20 0540  WBC 7.7 8.6    Liver Function Tests: Recent Labs  Lab 05/21/20 0919  AST 29  ALT 32  ALKPHOS 72  BILITOT 0.3  PROT 8.0  ALBUMIN 4.2   No  results for input(s): LIPASE, AMYLASE in the last 168 hours. No results for input(s): AMMONIA in the last 168 hours.  ABG    Component Value Date/Time   PHART 7.325 (L) 11/16/2016 1630   PCO2ART 53.9 (H) 11/16/2016 1630   PO2ART 57.2 (L) 11/16/2016 1630   HCO3 26.1 05/20/2020 2230   TCO2 30 06/15/2011 0357   ACIDBASEDEF 1.0 06/14/2011 1636   O2SAT 92.7 05/20/2020 2230     Coagulation Profile: No results for input(s): INR, PROTIME in the last 168 hours.  Cardiac Enzymes: No results for input(s): CKTOTAL, CKMB, CKMBINDEX, TROPONINI in the last 168 hours.  HbA1C: Hgb A1c MFr Bld  Date/Time Value Ref Range Status  01/03/2018 04:01 PM 5.7 (H) <5.7 % of total Hgb Final    Comment:    For someone without known diabetes, a hemoglobin  A1c value between 5.7% and 6.4% is consistent with prediabetes and should be confirmed with a  follow-up test. . For someone with known diabetes, a value <7% indicates that their diabetes is well controlled. A1c targets should be individualized based on duration of diabetes, age, comorbid conditions, and other considerations. . This assay result is consistent with an increased risk of diabetes. . Currently, no consensus exists regarding use of hemoglobin A1c for diagnosis of diabetes for children. .     CBG: No results for input(s): GLUCAP in the last 168 hours.   Julian Hy, DO 05/22/20 5:36 PM  Pulmonary & Critical Care

## 2020-05-23 LAB — CBC WITH DIFFERENTIAL/PLATELET
Abs Immature Granulocytes: 0.04 10*3/uL (ref 0.00–0.07)
Basophils Absolute: 0 10*3/uL (ref 0.0–0.1)
Basophils Relative: 0 %
Eosinophils Absolute: 0 10*3/uL (ref 0.0–0.5)
Eosinophils Relative: 0 %
HCT: 47.4 % — ABNORMAL HIGH (ref 36.0–46.0)
Hemoglobin: 15.3 g/dL — ABNORMAL HIGH (ref 12.0–15.0)
Immature Granulocytes: 1 %
Lymphocytes Relative: 19 %
Lymphs Abs: 1.5 10*3/uL (ref 0.7–4.0)
MCH: 29.9 pg (ref 26.0–34.0)
MCHC: 32.3 g/dL (ref 30.0–36.0)
MCV: 92.8 fL (ref 80.0–100.0)
Monocytes Absolute: 1.2 10*3/uL — ABNORMAL HIGH (ref 0.1–1.0)
Monocytes Relative: 15 %
Neutro Abs: 5 10*3/uL (ref 1.7–7.7)
Neutrophils Relative %: 65 %
Platelets: 412 10*3/uL — ABNORMAL HIGH (ref 150–400)
RBC: 5.11 MIL/uL (ref 3.87–5.11)
RDW: 13.8 % (ref 11.5–15.5)
WBC: 7.9 10*3/uL (ref 4.0–10.5)
nRBC: 0 % (ref 0.0–0.2)

## 2020-05-23 LAB — BASIC METABOLIC PANEL
Anion gap: 11 (ref 5–15)
BUN: 21 mg/dL (ref 8–23)
CO2: 28 mmol/L (ref 22–32)
Calcium: 8.9 mg/dL (ref 8.9–10.3)
Chloride: 91 mmol/L — ABNORMAL LOW (ref 98–111)
Creatinine, Ser: 0.87 mg/dL (ref 0.44–1.00)
GFR calc Af Amer: >60 mL/min (ref >60)
GFR calc non Af Amer: >60 mL/min (ref >60)
Glucose, Bld: 96 mg/dL (ref 70–99)
Potassium: 3.7 mmol/L (ref 3.5–5.1)
Sodium: 130 mmol/L — ABNORMAL LOW (ref 135–145)

## 2020-05-23 LAB — MAGNESIUM: Magnesium: 2.2 mg/dL (ref 1.7–2.4)

## 2020-05-23 LAB — TROPONIN I (HIGH SENSITIVITY): Troponin I (High Sensitivity): 19 ng/L — ABNORMAL HIGH (ref <18)

## 2020-05-23 MED ORDER — FLUTICASONE PROPIONATE 50 MCG/ACT NA SUSP
1.0000 | Freq: Two times a day (BID) | NASAL | Status: DC | PRN
  Filled 2020-05-23: qty 16

## 2020-05-23 MED ORDER — ENSURE ENLIVE PO LIQD
237.0000 mL | ORAL | Status: DC
  Administered 2020-05-26 – 2020-05-28 (×3): 237 mL via ORAL

## 2020-05-23 NOTE — Progress Notes (Signed)
Chaplain responded to spiritual consult. Patient wishing to complete AD.  Patient was alert and in chair.  Husband was in room.  Chaplain had waited for doctor to leave before coming in.  Patient smiled when chaplain entered, seemingly relieved by the doctor's visit.  Chaplain explained the process of completing an AD and husband asked questions.  They each asked for a form to complete but felt it was not urgent and they would wait until patient's discharge to complete them. Chaplain then opened conversation about patient's grief over loss of son.  Patient admitted that she had not felt well enough to be around people and do the things she used to do.  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 She took care of him at her home when he was sick.  She knew his death was coming.  "He never married.  Maybe I was too close to him." She said her second son had moved in at the passing of the other son. Patient misses her role of caregiving. Her husband's eyes reflected his love and concern for her. [place] she said, "I love to keep house clean.  To cook.  To garden....but I don't have the energy to even dust."  Chaplain offered words of encouragement and encouraged her to continue to speak of her son to move through the grief.  And to welcome the support she is surely getting.  Visit ended with a blessing. Rev. Tamsen Snider Pager [phone]

## 2020-05-23 NOTE — Progress Notes (Signed)
PROGRESS NOTE    [name]   JEH:[id number]  DOB: 1946/01/09  DOA: 05/20/2020     3  PCP: [place], [name], MD  CC: [facility] Course: [name] is a 74 year old Caucasian female with PMH COPD (on 2 L chronic nasal cannula at home), ongoing grief/mourning due to son passing away 07-20-19, hard of hearing, PVD, hypothyroidism, hypertension, hyperlipidemia, diverticulosis, CAD who presented to the hospital with worsening dyspnea at home.  She states that she has had multiple episodes over the past year of severe shortness of breath and has been on multiple rounds of steroids and antibiotics.  She associates much of her dyspnea due to stress episodes centered around grieving after her son passed away last 07/20/23.  She became tearful while talking with me about this.  She states that she has not been on any medication nor met with a counselor since he passed away but states that she feels like she should be.  She further went on to endorse difficulty with sleeping at night (currently using trazodone and Ativan), still has interest in hobbies but too short of breath to do them, decreased energy, decreased concentration/ability to focus, decreased appetite with associated weight loss from this.  We discussed initiation of antidepressant given her significantly depressed mood which she was amenable for.  She was admitted for suspected COPD exacerbation.  She was started on steroids, azithromycin, and breathing treatments.  Pulmonology was consulted as well for further recommendations and evaluation.  She also was ordered an echo in setting of her worsening dyspnea and that she had also endorsed PND at home as well. EF was 45-50%, reduced from 60-65% in Oct 2020.  The morning following admission she had improved overall. She stated she voided well and afterwards says that her breathing has slowly improved since admission.  She was continued on diuresis and in setting of her newly reduced EF,  after discussion with patient, she wished for inpatient evaluation by cardiology.    Interval History:  No acute events overnight.   Husband present this am as well. She says she continues to feel improved each day and her breathing does feel better; she is voiding well. She is preferring for cardiology to evaluate inpatient at this time as well.   Old records reviewed in assessment of this patient  ROS: Constitutional: positive for fatigue and malaise, Respiratory: positive for cough, dyspnea on exertion and wheezing, Cardiovascular: negative for chest pain and Gastrointestinal: negative for abdominal pain  Assessment & Plan: Hypothyroidism - continue synthroid  Hyperlipidemia - continue statin  Essential hypertension - continue current regimen  GERD - continue PPI  COPD with acute exacerbation ([place]) - last seen by pulm outpatient 04/25/20; was given sample/Rx of Trelegy, Singulair added and recommended to consider Daliresp if ongoing AECOPD - follow up further pulm rec's while here - continue solumedrol, now on levaquin (azithro stopped), nebs - see CHF  Acute on chronic respiratory failure with hypoxia ([place]) - possibly multifactorial at this time in setting of COPD exac, anxiety, and possible component of superimposed heart failure - follow up response to lasix and COPD exac treatment; patient endorses great improvement after diuresis and improvement in breathing - wean O2 as able; on 4L on admission up from baseline 2L  Chronic mesenteric ischemia (HCC) -Continue aspirin, Pletal, Lipitor  CAD (coronary artery disease) -Continue aspirin, Lipitor  Acute systolic CHF (congestive heart failure) ([place]) -Mildly reduced EF on current echo, 40 to 45%, LV global hypokinesis,  indeterminate filling pattern.  Previous echo reviewed from October 2020 (EF 60 to 65%). -Patient does have underlying vascular disease with risk for developing CHF.  Other considered differential includes  stress-induced cardiomyopathy from her severe grieving over the past year from the loss of her son -BNP borderline elevated 99 on admission and was (28, 3 years ago) at last check; do not appreciate any obvious pleural effusions on CXR or CT chest.  There may be some very mild pulmonary edema on CXR however may also be confounded by underlying severe emphysema -Trial of Lasix per pulmonology, will follow up response: Urine output remains excellent and patient feels subjectively better -Troponin negative x2.  If any chest pain, repeat EKG - after further discussion with patient and husband bedside, she wishes for cardiology to evaluate further while she is hospitalized as opposed to outpatient. Consult placed - of note, Na level has downtrended some; if further diuresis, will need to watch this as well - further diuresis pending cardiology evaluation but overall diuresis seems to be improving her work of breathing  Major depressive disorder, single episode, moderate degree ([place]) -Patient noted to be depressed on interview and anxious when giving collateral information regarding her dyspnea which she then progresses into talking about the death of her son in 07-17-19.  He was diagnosed with alcoholic cirrhosis of the liver and developed further decompensation and ultimately passed away.  She has had a hard time dealing with grieving process since.  She states that she has not spoke to a counselor nor started medication but she is interested in medication at this time. -She does meet criteria for depression and initiation of medication at this time: Denies SI/HI, endorses decreased energy, difficulty with sleep at night (requiring trazodone and Ativan currently), decreased interest in doing hobbies due to severe dyspnea as well, decreased concentration/ability to focus, decreased appetite with associated weight loss -Given difficulty with sleep, will initiate treatment with Paxil and further adjust  outpatient depending on patient response.  If does not tolerate Paxil, would then initiate alternative SSRI - as of 8/9, she has been tolerating well and stating that her sleep has improved since being started as well    Antimicrobials: Levaquin 05/21/2020>> current  DVT prophylaxis: Lovenox Code Status: Full Family Communication: None present Disposition Plan:  Status is: Inpatient  Remains inpatient appropriate because:Ongoing diagnostic testing needed not appropriate for outpatient work up, Unsafe d/c plan, IV treatments appropriate due to intensity of illness or inability to take PO and Inpatient level of care appropriate due to severity of illness   Dispo: The patient is from: Home              Anticipated d/c is to: Home              Anticipated d/c date is: 2 days              Patient currently is not medically stable to d/c.  Objective: Blood pressure 119/88, pulse 79, temperature 97.9 F (36.6 C), temperature source Oral, resp. rate (!) 24, height _0  (1.676 m), weight 61 kg, SpO2 92 %.  Examination: General appearance: pleasant woman sitting up in chair with husband bedside; she appears comfortable and her breathing is much improved and nonlabored Head: Normocephalic, without obvious abnormality, atraumatic Eyes: EOMI Lungs: No further wheezing but lung sounds remain severely coarse bilaterally Heart: regular rate and rhythm and S1, S2 normal Abdomen: normal findings: bowel sounds normal and soft, non-tender Extremities: No lower  extremity edema Skin: mobility and turgor normal Neurologic: Grossly normal Psych: Normal affect, appropriate mood now  Consultants:   Pulmonology  Cardiology  Procedures:   TTE, 05/21/2020  Data Reviewed: I have personally reviewed following labs and imaging studies Results for orders placed or performed during the hospital encounter of 05/20/20 (from the past 24 hour(s))  Basic metabolic panel     Status: Abnormal   Collection Time:  05/23/20  5:06 AM  Result Value Ref Range   Sodium 130 (L) 135 - 145 mmol/L   Potassium 3.7 3.5 - 5.1 mmol/L   Chloride 91 (L) 98 - 111 mmol/L   CO2 28 22 - 32 mmol/L   Glucose, Bld 96 70 - 99 mg/dL   BUN 21 8 - 23 mg/dL   Creatinine, Ser 0.87 0.44 - 1.00 mg/dL   Calcium 8.9 8.9 - 10.3 mg/dL   GFR calc non Af Amer >60 >60 mL/min   GFR calc Af Amer >60 >60 mL/min   Anion gap 11 5 - 15  CBC with Differential/Platelet     Status: Abnormal   Collection Time: 05/23/20  5:06 AM  Result Value Ref Range   WBC 7.9 4.0 - 10.5 K/uL   RBC 5.11 3.87 - 5.11 MIL/uL   Hemoglobin 15.3 (H) 12.0 - 15.0 g/dL   HCT 47.4 (H) 36 - 46 %   MCV 92.8 80.0 - 100.0 fL   MCH 29.9 26.0 - 34.0 pg   MCHC 32.3 30.0 - 36.0 g/dL   RDW 13.8 11.5 - 15.5 %   Platelets 412 (H) 150 - 400 K/uL   nRBC 0.0 0.0 - 0.2 %   Neutrophils Relative % 65 %   Neutro Abs 5.0 1.7 - 7.7 K/uL   Lymphocytes Relative 19 %   Lymphs Abs 1.5 0.7 - 4.0 K/uL   Monocytes Relative 15 %   Monocytes Absolute 1.2 (H) 0 - 1 K/uL   Eosinophils Relative 0 %   Eosinophils Absolute 0.0 0 - 0 K/uL   Basophils Relative 0 %   Basophils Absolute 0.0 0 - 0 K/uL   Immature Granulocytes 1 %   Abs Immature Granulocytes 0.04 0.00 - 0.07 K/uL  Magnesium     Status: None   Collection Time: 05/23/20  5:06 AM  Result Value Ref Range   Magnesium 2.2 1.7 - 2.4 mg/dL    Recent Results (from the past 240 hour(s))  SARS Coronavirus 2 by RT PCR (hospital order, performed in [facility] hospital lab) Nasopharyngeal Nasopharyngeal Swab     Status: None   Collection Time: 05/20/20  4:57 PM   Specimen: Nasopharyngeal Swab  Result Value Ref Range Status   SARS Coronavirus 2 NEGATIVE NEGATIVE Final    Comment: (NOTE) SARS-CoV-2 target nucleic acids are NOT DETECTED.  The SARS-CoV-2 RNA is generally detectable in upper and lower respiratory specimens during the acute phase of infection. The lowest concentration of SARS-CoV-2 viral copies this assay can detect is  250 copies / mL. A negative result does not preclude SARS-CoV-2 infection and should not be used as the sole basis for treatment or other patient management decisions.  A negative result may occur with improper specimen collection / handling, submission of specimen other than nasopharyngeal swab, presence of viral mutation(s) within the areas targeted by this assay, and inadequate number of viral copies (<250 copies / mL). A negative result must be combined with clinical observations, patient history, and epidemiological information.  Fact Sheet for Patients:   StrictlyIdeas.no  Fact Sheet for Healthcare Providers: BankingDealers.co.za  This test is not yet approved or  cleared by the [place] FDA and has been authorized for detection and/or diagnosis of SARS-CoV-2 by FDA under an Emergency Use Authorization (EUA).  This EUA will remain in effect (meaning this test can be used) for the duration of the COVID-19 declaration under Section 564(b)(1) of the Act, 21 U.S.C. section 360bbb-3(b)(1), unless the authorization is terminated or revoked sooner.  Performed at [facility], [place] [address] [place], [place] [address]   Respiratory Panel by PCR     Status: None   Collection Time: 05/21/20  7:27 AM   Specimen: Nasopharyngeal Swab; Respiratory  Result Value Ref Range Status   Adenovirus NOT DETECTED NOT DETECTED Final   Coronavirus 229E NOT DETECTED NOT DETECTED Final    Comment: (NOTE) The Coronavirus on the Respiratory Panel, DOES NOT test for the novel  Coronavirus (2019 nCoV)    Coronavirus HKU1 NOT DETECTED NOT DETECTED Final   Coronavirus NL63 NOT DETECTED NOT DETECTED Final   Coronavirus OC43 NOT DETECTED NOT DETECTED Final   Metapneumovirus NOT DETECTED NOT DETECTED Final   Rhinovirus / Enterovirus NOT DETECTED NOT DETECTED Final   Influenza A NOT DETECTED NOT DETECTED Final   Influenza B NOT  DETECTED NOT DETECTED Final   Parainfluenza Virus 1 NOT DETECTED NOT DETECTED Final   Parainfluenza Virus 2 NOT DETECTED NOT DETECTED Final   Parainfluenza Virus 3 NOT DETECTED NOT DETECTED Final   Parainfluenza Virus 4 NOT DETECTED NOT DETECTED Final   Respiratory Syncytial Virus NOT DETECTED NOT DETECTED Final   Bordetella pertussis NOT DETECTED NOT DETECTED Final   Chlamydophila pneumoniae NOT DETECTED NOT DETECTED Final   Mycoplasma pneumoniae NOT DETECTED NOT DETECTED Final    Comment: Performed at [facility], [place]. [address] [place], [place] [address]     Radiology Studies: ECHOCARDIOGRAM COMPLETE  Result Date: 05/21/2020    ECHOCARDIOGRAM REPORT   Patient Name:   SHERRINE SALBERG Date of Exam: 05/21/2020 Medical Rec #:  [id number]     Height:       66.0 in Accession #:    [id number]    Weight:       134.5 lb Date of Birth:  04-Nov-1945     BSA:          1.689 m Patient Age:    69 years      BP:           131/94 mmHg Patient Gender: F             HR:           81 bpm. Exam Location:  Inpatient Procedure: 2D Echo STAT ECHO Indications:    Dyspnea R06.00  History:        Patient has prior history of Echocardiogram examinations, most                 recent 08/03/2019. CAD, COPD; Risk Factors:Hypertension and                 Dyslipidemia.  Sonographer:    Mikki Santee RDCS (AE) Referring Phys: [place]  Sonographer Comments: Technically difficult study due to poor echo windows. IMPRESSIONS  1. Left ventricular ejection fraction, by estimation, is 45 to 50%. The left ventricle has mildly decreased function. The left ventricle demonstrates global hypokinesis. Indeterminate diastolic filling due to E-A fusion. Definity contrast administered but images remain suboptimal to exclude  regional wall motion abnormalities.  2. Right ventricular systolic function is normal. The right ventricular size is normal. Tricuspid regurgitation signal is inadequate for assessing PA pressure.  3. Left  atrial size was mildly dilated.  4. The mitral valve is degenerative. Trivial mitral valve regurgitation. Mild mitral stenosis. The mean mitral valve gradient is 3.0 mmHg with average heart rate of 85 bpm.  5. The aortic valve is grossly normal. Aortic valve regurgitation is not visualized. No aortic stenosis is present.  6. The inferior vena cava is dilated in size with >50% respiratory variability, suggesting right atrial pressure of 8 mmHg. Comparison(s): A prior study was performed on 08/03/2019. Prior images reviewed side by side. LVEF compared to prior appears reduced. FINDINGS  Left Ventricle: Left ventricular ejection fraction, by estimation, is 45 to 50%. The left ventricle has mildly decreased function. The left ventricle demonstrates global hypokinesis. Definity contrast agent was given IV to delineate the left ventricular  endocardial borders. The left ventricular internal cavity size was normal in size. There is no left ventricular hypertrophy. Indeterminate diastolic filling due to E-A fusion. Right Ventricle: The right ventricular size is normal. Right vetricular wall thickness was not assessed. Right ventricular systolic function is normal. Tricuspid regurgitation signal is inadequate for assessing PA pressure. Left Atrium: Left atrial size was mildly dilated. Right Atrium: Right atrial size was normal in size. Pericardium: There is no evidence of pericardial effusion. Mitral Valve: The mitral valve is degenerative in appearance. Normal mobility of the mitral valve leaflets. Mild to moderate mitral annular calcification. Trivial mitral valve regurgitation. Mild mitral valve stenosis. The mean mitral valve gradient is 3.0 mmHg with average heart rate of 85 bpm. Tricuspid Valve: The tricuspid valve is normal in structure. Tricuspid valve regurgitation is trivial. No evidence of tricuspid stenosis. Aortic Valve: The aortic valve is grossly normal. Aortic valve regurgitation is not visualized. No aortic  stenosis is present. Pulmonic Valve: The pulmonic valve was normal in structure. Pulmonic valve regurgitation is not visualized. No evidence of pulmonic stenosis. Aorta: The aortic root is normal in size and structure. Venous: The inferior vena cava is dilated in size with greater than 50% respiratory variability, suggesting right atrial pressure of 8 mmHg. IAS/Shunts: No atrial level shunt detected by color flow Doppler.  LEFT VENTRICLE PLAX 2D LVIDd:         4.30 cm  Diastology LVIDs:         3.00 cm  LV e' lateral:   5.92 cm/s LV PW:         1.00 cm  LV E/e' lateral: 12.4 LV IVS:        1.00 cm  LV e' medial:    4.88 cm/s LVOT diam:     2.10 cm  LV E/e' medial:  15.1 LV SV:         70 LV SV Index:   41 LVOT Area:     3.46 cm  RIGHT VENTRICLE RV S prime:     15.30 cm/s TAPSE (M-mode): 2.7 cm LEFT ATRIUM             Index       RIGHT ATRIUM           Index LA diam:        3.90 cm 2.31 cm/m  RA Area:     13.80 cm LA Vol (A2C):   51.5 ml 30.48 ml/m RA Volume:   34.60 ml  20.48 ml/m LA Vol (A4C):   51.0 ml  30.19 ml/m LA Biplane Vol: 56.4 ml 33.38 ml/m  AORTIC VALVE LVOT Vmax:   99.80 cm/s LVOT Vmean:  62.300 cm/s LVOT VTI:    0.202 m  AORTA Ao Root diam: 3.00 cm MITRAL VALVE MV Area (PHT): 3.89 cm     SHUNTS MV Mean grad:  3.0 mmHg     Systemic VTI:  0.20 m MV Decel Time: 195 msec     Systemic Diam: 2.10 cm MV E velocity: 73.50 cm/s MV A velocity: 128.00 cm/s MV E/A ratio:  0.57 Cherlynn Kaiser MD Electronically signed by Cherlynn Kaiser MD Signature Date/Time: 05/21/2020/2:10:00 PM    Final    CT Angio Chest PE W and/or Wo Contrast  Final Result    DG Chest Portable 1 View  Final Result       Scheduled Meds: . aspirin EC  81 mg Oral Daily  . atorvastatin  80 mg Oral Daily  . cilostazol  100 mg Oral BID  . dextromethorphan-guaiFENesin  1 tablet Oral BID  . docusate sodium  100 mg Oral BID  . enoxaparin (LOVENOX) injection  40 mg Subcutaneous Q24H  . feeding supplement (ENSURE ENLIVE)  237 mL  Oral BID BM  . ipratropium-albuterol  3 mL Nebulization Q6H  . levofloxacin  750 mg Oral Daily  . levothyroxine  112 mcg Oral Q0600  . LORazepam  2 mg Oral QHS  . mometasone-formoterol  1 puff Inhalation BID  . montelukast  10 mg Oral QHS  . pantoprazole  40 mg Oral Daily  . PARoxetine  20 mg Oral QHS  . predniSONE  40 mg Oral Q breakfast  . sodium chloride flush  3 mL Intravenous Q12H  . sodium chloride flush  3 mL Intravenous Q12H   PRN Meds: acetaminophen **OR** acetaminophen, albuterol, HYDROcodone-acetaminophen, ipratropium-albuterol, ondansetron **OR** ondansetron (ZOFRAN) IV Continuous Infusions:     LOS: 3 days  Time spent: Greater than 50% of the 35 minute visit was spent in counseling/coordination of care for the patient as laid out in the A&P.   Dwyane Dee, MD [facility] 05/23/2020, 11:02 AM   Contact via secure chat.  To contact the attending provider between 7A-7P or the covering provider during after hours 7P-7A, please log into the web site [email or web address] and access using universal [place] password for that web site. If you do not have the password, please call the hospital operator.

## 2020-05-23 NOTE — Care Management Important Message (Signed)
Important Message  Patient Details IM Letter given to the Patient Name: [name] MRN: [id number] Date of Birth: April 08, 1946   Medicare Important Message Given:  Yes     Kerin Salen 05/23/2020, 10:05 AM

## 2020-05-23 NOTE — Progress Notes (Signed)
NAME:  [name], MRN:  [id number], DOB:  [birth year], LOS: 3 ADMISSION DATE:  05/20/2020, CONSULTATION DATE:  05/21/2020 REFERRING MD:  [facility], CHIEF COMPLAINT: Shortness of breath  Brief History   74 year old female with extensive past medical history is followed by Dr. Chase Caller has not felt well since September 2020. Frequent exacerbations of respiratory treatment with multiple antimicrobial steroids O2 dependent at 2 L nocturnal oxygen today. Reports have increased wheezing shortness of breath denies chest pain. She has a cough that is nonproductive. She presented multiple times in the office and there was to be seen on 05/21/2019 office unfortunately the office while. Patient presented with emergency department for further evaluation and treatment. She was admitted by [facility] hospitalist team pulmonary critical care was asked to evaluate 05/21/2020.  Past Medical History   Past Medical History:  Diagnosis Date  . Bilateral leg cramps   . Bladder neoplasm   . Chronic deep vein thrombosis (DVT) of right lower extremity ([place])    05/ 2018  chronic non-occlusive DVT RLE  . COPD, frequent exacerbations ([place])    03-06-2018 per pt last exacerbation 12/ 2018  . Coronary artery disease    cardiologist-  dr Aundra Dubin-- 03-11-2018 er cath , 80% stenosis in the ostial second diagonal  . Dyspnea on minimal exertion   . Emphysema/COPD ([place])    CAT score- 17  . Family history of breast cancer   . Family history of colon cancer   . Family history of melanoma   . Family history of ovarian cancer   . Family history of pancreatic cancer   . Fibromyalgia 1995  . GERD (gastroesophageal reflux disease)   . Hiatal hernia   . History of breast cancer   . History of diverticulitis of colon    2002  s/p  sigmoid colectomy  . History of multiple pulmonary nodules    hx RUL nodules x2  s/p right VATS w/ wedge resection 09-11-2005 and 06-14-2011  both  necrotizing granulomatous inflammation w/ cystic area of  necrosis & focal calcification  . History of right breast cancer oncologist-  dr Jana Hakim-- no recurrence   dx 2010--  IDC, Stage IA , ER/PR+,  (pT1c pN0) 11-09-2008 right lumpectomy;  12-18-2008 right simple mastectomy for DCIS margins;  completed chemotherapy 2010 (no radiation) and completed antiestrogen therapy  . Hx of colonic polyps    Dr. Cristina Gong -last study '11  . Hyperlipidemia   . Hypertension   . Hypothyroidism   . 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 Nocturia   . OA (osteoarthritis)    thumbs  . On supplemental oxygen therapy    03-06-2018 per pt uses only at night,  checks O2 sats at home,  stated am sat 89% after moving around average 93-94% with RA  . Osteoporosis   . Peripheral arterial occlusive disease [facility]) vascular-- dr chen/ dr Donzetta Matters   proximal right SFA severe focal stenosis with collaterals from the PFA/  04/ 2012 occluded celiac and SMA arteries with distal reconstitution w/ patent IMA  . Peripheral vascular disease ([place])    chronic DVT RLE,  mesenteric vascular disease  . Right lower lobe pulmonary nodule    Chest CT 08-29-2017  . Stage 3 severe COPD by GOLD classification ([place]) hx frequent exacerbations--   pulmologist-  dr Maryann Alar--  per lov note , dated 12-20-2017, oxyogen 2L is prescribed for use with exertion (but pt only uses mostly at night), O2 sats  on RA run the 80s , this day sat 86% RA and with 2L O2 sat 92%  . Varicose veins of leg with swelling    varicose vein surgery - Dr. Aleda Grana  . Wears glasses   . Wears hearing aid in both ears    Significant Hospital Events    Consults:  Pulmonary 05/21/2020  Procedures:  None  Significant Diagnostic Tests:  Echocardiogram  Micro Data:    Antimicrobials:   Levaquin8/7 >  Interim history/subjective:  Breathing feels a little bit better Objective   Blood pressure 139/80, pulse 84, temperature 98.5 F (36.9 C), temperature source Oral, resp. rate 18,  height 5\' 6"  (1.676 m), weight 61 kg, SpO2 91 %.        Intake/Output Summary (Last 24 hours) at 05/23/2020 1356 Last data filed at 05/23/2020 0500 Gross per 24 hour  Intake 480 ml  Output 2050 ml  Net -1570 ml   Filed Weights   05/20/20 1622  Weight: 61 kg    Examination: General: Elderly, appears comfortable HENT: Moist oral mucosa Lungs: Decreased air movement bilaterally, rhonchi Cardiovascular: S1-S2 appreciated Abdomen: Bowel sounds appreciated Extremities: No clubbing, no edema Neuro: Alert and oriented GU:   Resolved Hospital Problem list     Assessment & Plan:  Acute on chronic hypoxemic respiratory failure in the setting of COPD Acute pulmonary edema -Continue oxygen supplementation -Cautious diuresis -Continue bronchodilators -Complete course of antibiotics-on oral Levaquin -Continue steroids-currently on prednisone p.o.  Decompensated heart failure -Cautious diuresis  Hypertension   Labs   CBC: Recent Labs  Lab 05/20/20 1657 05/22/20 0540 05/23/20 0506  WBC 7.7 8.6 7.9  NEUTROABS 6.9 6.8 5.0  HGB 14.7 14.3 15.3*  HCT 44.5 43.3 47.4*  MCV 92.7 93.1 92.8  PLT 430* 409* 412*    Basic Metabolic Panel: Recent Labs  Lab 05/20/20 1657 05/20/20 1855 05/21/20 0919 05/22/20 0540 05/23/20 0506  NA 141  --  138 136 130*  K 4.6  --  4.1 4.2 3.7  CL 103  --  100 98 91*  CO2 26  --  28 28 28   GLUCOSE 123*  --  156* 141* 96  BUN 16  --  15 27* 21  CREATININE 0.80  --  0.74 0.83 0.87  CALCIUM 9.4  --  9.5 9.2 8.9  MG  --  2.1 2.1 2.4 2.2  PHOS  --   --  3.6  --   --    GFR: Estimated Creatinine Clearance: 52.3 mL/min (by C-G formula based on SCr of 0.87 mg/dL). Recent Labs  Lab 05/20/20 1657 05/22/20 0540 05/23/20 0506  WBC 7.7 8.6 7.9    Liver Function Tests: Recent Labs  Lab 05/21/20 0919  AST 29  ALT 32  ALKPHOS 72  BILITOT 0.3  PROT 8.0  ALBUMIN 4.2   No results for input(s): LIPASE, AMYLASE in the last 168 hours. No  results for input(s): AMMONIA in the last 168 hours.  ABG    Component Value Date/Time   PHART 7.325 (L) 11/16/2016 1630   PCO2ART 53.9 (H) 11/16/2016 1630   PO2ART 57.2 (L) 11/16/2016 1630   HCO3 26.1 05/20/2020 2230   TCO2 30 06/15/2011 0357   ACIDBASEDEF 1.0 06/14/2011 1636   O2SAT 92.7 05/20/2020 2230     Coagulation Profile: No results for input(s): INR, PROTIME in the last 168 hours.  Cardiac Enzymes: No results for input(s): CKTOTAL, CKMB, CKMBINDEX, TROPONINI in the last 168 hours.  HbA1C: Hgb A1c MFr Bld  Date/Time Value Ref Range Status  01/03/2018 04:01 PM 5.7 (H) <5.7 % of total Hgb Final    Comment:    For someone without known diabetes, a hemoglobin  A1c value between 5.7% and 6.4% is consistent with prediabetes and should be confirmed with a  follow-up test. . For someone with known diabetes, a value <7% indicates that their diabetes is well controlled. A1c targets should be individualized based on duration of diabetes, age, comorbid conditions, and other considerations. . This assay result is consistent with an increased risk of diabetes. . Currently, no consensus exists regarding use of hemoglobin A1c for diagnosis of diabetes for children. .     CBG: No results for input(s): GLUCAP in the last 168 hours.  Review of Systems:   Feeling gradually better  Past Medical History  She,  has a past medical history of Bilateral leg cramps, Bladder neoplasm, Chronic deep vein thrombosis (DVT) of right lower extremity ([place]), COPD, frequent exacerbations ([place]), Coronary artery disease, Dyspnea on minimal exertion, Emphysema/COPD ([place]), Family history of breast cancer, Family history of colon cancer, Family history of melanoma, Family history of ovarian cancer, Family history of pancreatic cancer, Fibromyalgia (1995), GERD (gastroesophageal reflux disease), Hiatal hernia, History of breast cancer, History of diverticulitis of colon, History of multiple  pulmonary nodules, History of right breast cancer (oncologist-  dr Jana Hakim-- no recurrence), colonic polyps, Hyperlipidemia, Hypertension, Hypothyroidism, Intestinal angina ([place]), Nocturia, OA (osteoarthritis), On supplemental oxygen therapy, Osteoporosis, Peripheral arterial occlusive disease ([place]) (vascular-- dr chen/ dr Donzetta Matters), Peripheral vascular disease ([place]), Right lower lobe pulmonary nodule, Stage 3 severe COPD by GOLD classification ([place]) (hx frequent exacerbations--), Varicose veins of leg with swelling, Wears glasses, and Wears hearing aid in both ears.   Surgical History    Past Surgical History:  Procedure Laterality Date  . BREAST EXCISIONAL BIOPSY Left 10/15/2008  . BREAST LUMPECTOMY W/ NEEDLE LOCALIZATION Right 11-09-2008    dr Brantley Stage   [facility]  . CARDIAC CATHETERIZATION  03-12-2011   dr Aundra Dubin   70-80% ostial stenosis in small second diagonal (appears to small for intervention),  dLAD 40-50%,  minimal luminal irregulartieis involoving LCFx and RCA,  LVEF 55%  . CATARACT EXTRACTION W/ INTRAOCULAR LENS  IMPLANT, BILATERAL  2011  . CYSTOSCOPY N/A 03/11/2018   Procedure: CYSTOSCOPY WITH INSTILLATION OF POST OPERATIVE EPIRUBICIN;  Surgeon: Festus Aloe, MD;  Location: [facility];  Service: Urology;  Laterality: N/A;  . MASTECTOMY Right   . PARTIAL COLECTOMY  2002   sigmoid and appectomy (diverticulitis)  . PARTIAL MASTECTOMY WITH NEEDLE LOCALIZATION Left 02/23/2013   Procedure:  LEFT PARTIAL MASTECTOMY WITH NEEDLE LOCALIZATION;  Surgeon: Adin Hector, MD;  Location: [facility];  Service: General;  Laterality: Left;  . RECONSTRUCTION BREAST W/ LATISSIMUS DORSI FLAP Right 05-30-2009   dr Harlow Mares  [facility]  . REDUCTION MAMMAPLASTY Left   . SIMPLE MASTECTOMY Right 12-28-2008    dr Brantley Stage  [facility]  . TRANSTHORACIC ECHOCARDIOGRAM  07-09-2016  dr Aundra Dubin   ef 55-60%, grade 1 diastoic dysfunction/  mild TR  . TRANSURETHRAL RESECTION OF BLADDER TUMOR N/A 03/11/2018   Procedure:  TRANSURETHRAL RESECTION OF BLADDER TUMOR (TURBT) 2-5cm;  Surgeon: Festus Aloe, MD;  Location: [facility];  Service: Urology;  Laterality: N/A;  . VAGINAL HYSTERECTOMY  1973  . VIDEO ASSISTED THORACOSCOPY (VATS)/WEDGE RESECTION Right 09-11-2005  &  06-14-2011   dr Fara Boros  [facility]   both RUL     Social History   reports that she quit smoking  about 4 years ago. Her smoking use included cigarettes. She has a 37.50 pack-year smoking history. She has never used smokeless tobacco. She reports current alcohol use of about 10.0 standard drinks of alcohol per week. She reports that she does not use drugs.   Family History   Her family history includes Breast cancer (age of onset: 53) in her niece; COPD in her mother; Cancer in her sister; Colon cancer in her mother; Coronary artery disease in her brother and father; Diabetes in her mother; Emphysema in her mother; Heart attack in her brother and father; Heart disease in her father; Hypertension in her mother, sister, and sister; Melanoma in her niece and sister; Ovarian cancer in her sister; Pancreatic cancer in her sister; Sudden death in her father; Throat cancer in her sister.   Allergies Allergies  Allergen Reactions  . Sulfa Antibiotics Swelling     Home Medications  Prior to Admission medications   Medication Sig Start Date End Date Taking? Authorizing Provider  albuterol (VENTOLIN HFA) 108 (90 Base) MCG/ACT inhaler Inhale 2 puffs into the lungs every 6 (six) hours as needed for wheezing or shortness of breath. 04/06/20  Yes Martyn Ehrich, NP  aspirin EC 81 MG tablet Take 81 mg by mouth daily.   Yes [provider]  atorvastatin (LIPITOR) 80 MG tablet TAKE 1 TABLET BY MOUTH EVERY DAY 03/23/20  Yes [name], NP  cilostazol (PLETAL) 100 MG tablet Take 1 tablet (100 mg total) by mouth 2 (two) times daily. 11/23/19  Yes [name], NP  dextromethorphan-guaiFENesin (MUCINEX DM) 30-600 MG 12hr tablet Take 1 tablet by mouth 2  (two) times daily.   Yes [provider]  fluticasone (FLONASE) 50 MCG/ACT nasal spray SPRAY 1 SPRAY INTO EACH NOSTRIL TWICE A DAY Patient taking differently: Place 1 spray into both nostrils 2 (two) times daily as needed for rhinitis.  04/15/20  Yes [place], [name], MD  Fluticasone-Umeclidin-Vilant (TRELEGY ELLIPTA) 200-62.5-25 MCG/INH AEPB Inhale 1 puff into the lungs daily. 04/25/20  Yes Martyn Ehrich, NP  ipratropium-albuterol (DUONEB) 0.5-2.5 (3) MG/3ML SOLN Inhale 3 mLs into the lungs every 6 (six) hours as needed (sob, wheezing). 04/25/20  Yes Martyn Ehrich, NP  levothyroxine (SYNTHROID) 112 MCG tablet TAKE 1 TABLET (112 MCG TOTAL) BY MOUTH DAILY BEFORE BREAKFAST. 12/21/19  Yes [place], [name], MD  LORazepam (ATIVAN) 2 MG tablet TAKE 1 TABLET BY MOUTH AT BEDTIME AS NEEDED FOR ANXIETY Patient taking differently: Take 2 mg by mouth at bedtime.  01/29/20  Yes [place], [name], MD  montelukast (SINGULAIR) 10 MG tablet Take 1 tablet (10 mg total) by mouth at bedtime. 04/29/20  Yes Martyn Ehrich, NP  omeprazole (PRILOSEC) 40 MG capsule TAKE 1 CAPSULE BY MOUTH TWICE A DAY Patient taking differently: Take 40 mg by mouth 2 (two) times daily.  12/21/19  Yes [place], [name], MD     Sherrilyn Rist, MD [place] [facility] Pager: [phone]

## 2020-05-23 NOTE — Consult Note (Addendum)
Cardiology Consultation:   Patient ID: [name] MRN: [id number]; DOB: 1946/05/29  Admit date: 05/20/2020 Date of Consult: 05/23/2020  Primary Care Provider: Martinique, [name], MD [facility] Cardiologist: Christy Champagne, MD old pt to see [name] 05/27/20 /L. Servando Snare, NP follows now [facility] Electrophysiologist:  None    Patient Profile:   [name] is a 74 y.o. female with a hx of HTN, COPD, breast cancer, HLD, PAD ([name] treated medically) -mesenteric vascular disease ([name]), CAD and has gone back to tobacco use since death of her son who is being seen today for the evaluation of decreased EF on admit with acute SOB at the request of [name].  History of Present Illness:   [name] with above hx.  She did have cardiac cath in 2012 with moderate sized 1st diag with minimal disease, and small to mod 2nd diag with 70-80% ostial stenosis too small for intervention and dLAD with 40-50% stenosis. Treated medically.  nuc study 2019 with EF 62% and low risk study  Fixed small apical septal perfusion defect. Given normal wall motion, suspect most likely due to attenuation.   Last echo 2020 with EF 60-65% borderline LVH.  Trivial pericardial effusion.  Trace MR.    Followed by Pulmonary for COPD on 2 L [place] chronically and now [name] for PAD her chronic mesenteric ischemia, he is following and ABIs were done and stable from 2018 .  Pt has been depressed with of her son last year.   Pt presented 05/20/20 to [facility] for SOB.  Dx with acute on chronic hypoxic respiratory failure in setting of severe emphysema, COPD and concern for pulmonary edema.  BNP wa 99 on admit  She was diuresed.  Her echo  this admit with Ef 45-50% + global hypokinesis and indeterminate diastolic filling due to E-A fusion.  Trivial MR  Pt is neg 2935 since admit and no weights.  She rec'd 2 doses of 40 mg IV lasix and no longer on diuretic.  Treated with steroids, azithromycin and nebulizers.  EKG:  The EKG  was personally reviewed and demonstrates:  SR at 69 and old ant q wave.  New T wave inversion in inf and lateral leads.  Telemetry:  Telemetry was personally reviewed and demonstrates:  SR  Hs troponin 15, 12 and today 19   Na 130, K+ 3.7 Mg+ 2.2  Hgb WBC 7.9 hgb 15.3 plts 412 TSH 0.433  CTA of chest done with: Neg PE Large hiatal hernia,  Marked severity emphysematous lung disease Cardiovascular: There is marked severity calcification of the aortic arch with an extensive amount of calcification seen within the origin of the left common carotid artery. Satisfactory opacification of the pulmonary arteries to the segmental level. No evidence of pulmonary embolism. Normal heart size. No pericardial effusion. Moderate severity coronary artery calcification is noted   BP  None today recorded P 89   She feels better today.  With dyspnea she has chest tightness.  She could not walk to BR without dyspnea but using nebs once or twice helped.  She would sleep in chair sitting up as well.    Past Medical History:  Diagnosis Date  . Bilateral leg cramps   . Bladder neoplasm   . Chronic deep vein thrombosis (DVT) of right lower extremity ([place])    05/ 2018  chronic non-occlusive DVT RLE  . COPD, frequent exacerbations ([place])    03-06-2018 per pt last exacerbation 12/ 2018  .  Coronary artery disease    cardiologist-  [name]-- 03-11-2018 er cath , 80% stenosis in the ostial second diagonal  . Dyspnea on minimal exertion   . Emphysema/COPD ([place])    CAT score- 17  . Family history of breast cancer   . Family history of colon cancer   . Family history of melanoma   . Family history of ovarian cancer   . Family history of pancreatic cancer   . Fibromyalgia 1995  . GERD (gastroesophageal reflux disease)   . Hiatal hernia   . History of breast cancer   . History of diverticulitis of colon    2002  s/p  sigmoid colectomy  . History of multiple pulmonary nodules    hx RUL nodules x2  s/p  right VATS w/ wedge resection 09-11-2005 and 06-14-2011  both  necrotizing granulomatous inflammation w/ cystic area of necrosis & focal calcification  . History of right breast cancer oncologist-  [name]-- no recurrence   dx 2010--  IDC, [name] [place] , ER/PR+,  (pT1c pN0) 11-09-2008 right lumpectomy;  12-18-2008 right simple mastectomy for DCIS margins;  completed chemotherapy 2010 (no radiation) and completed antiestrogen therapy  . Hx of colonic polyps    [name] -last study '11  . Hyperlipidemia   . Hypertension   . Hypothyroidism   . 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 Nocturia   . OA (osteoarthritis)    thumbs  . On supplemental oxygen therapy    03-06-2018 per pt uses only at night,  checks O2 sats at home,  stated am sat 89% after moving around average 93-94% with RA  . Osteoporosis   . Peripheral arterial occlusive disease [facility]) vascular-- [name]/ Christy Donzetta [name]   proximal right SFA severe focal stenosis with collaterals from the PFA/  04/ 2012 occluded celiac and SMA arteries with distal reconstitution w/ patent IMA  . Peripheral vascular disease ([place])    chronic DVT RLE,  mesenteric vascular disease  . Right lower lobe pulmonary nodule    Chest CT 08-29-2017  . [name] 3 severe COPD by GOLD classification ([place]) hx frequent exacerbations--   pulmologist-  Christy Maryann [name]--  per lov note , dated 12-20-2017, oxyogen 2L is prescribed for use with exertion (but pt only uses mostly at night), O2 sats on RA run the 80s , this day sat 86% RA and with 2L O2 sat 92%  . Varicose veins of leg with swelling    varicose vein surgery - [name]  . Wears glasses   . Wears hearing aid in both ears     Past Surgical History:  Procedure Laterality Date  . BREAST EXCISIONAL BIOPSY Left 10/15/2008  . BREAST LUMPECTOMY W/ NEEDLE LOCALIZATION Right 11-09-2008    Christy Brantley [name]   [facility]  . CARDIAC CATHETERIZATION  03-12-2011   [name]   70-80% ostial stenosis  in small second diagonal (appears to small for intervention),  dLAD 40-50%,  minimal luminal irregulartieis involoving LCFx and RCA,  LVEF 55%  . CATARACT EXTRACTION W/ INTRAOCULAR LENS  IMPLANT, BILATERAL  2011  . CYSTOSCOPY N/A 03/11/2018   Procedure: CYSTOSCOPY WITH INSTILLATION OF POST OPERATIVE EPIRUBICIN;  Surgeon: Festus Aloe, MD;  Location: [facility];  Service: Urology;  Laterality: N/A;  . MASTECTOMY Right   . PARTIAL COLECTOMY  2002   sigmoid and appectomy (diverticulitis)  . PARTIAL MASTECTOMY WITH NEEDLE LOCALIZATION Left 02/23/2013   Procedure:  LEFT PARTIAL MASTECTOMY WITH  NEEDLE LOCALIZATION;  Surgeon: Adin Hector, MD;  Location: [place];  Service: General;  Laterality: Left;  . RECONSTRUCTION BREAST W/ LATISSIMUS DORSI FLAP Right 05-30-2009   Christy Harlow Mares  [facility]  . REDUCTION MAMMAPLASTY Left   . SIMPLE MASTECTOMY Right 12-28-2008    Christy Brantley [name]  [facility]  . TRANSTHORACIC ECHOCARDIOGRAM  07-09-2016  [name]   ef 55-60%, grade 1 diastoic dysfunction/  mild TR  . TRANSURETHRAL RESECTION OF BLADDER TUMOR N/A 03/11/2018   Procedure: TRANSURETHRAL RESECTION OF BLADDER TUMOR (TURBT) 2-5cm;  Surgeon: Festus Aloe, MD;  Location: [facility];  Service: Urology;  Laterality: N/A;  . VAGINAL HYSTERECTOMY  1973  . VIDEO ASSISTED THORACOSCOPY (VATS)/WEDGE RESECTION Right 09-11-2005  &  06-14-2011   Christy Fara Boros  [facility]   both RUL     Home Medications:  Prior to Admission medications   Medication Sig Start Date End Date Taking? Authorizing Provider  albuterol (VENTOLIN HFA) 108 (90 Base) MCG/ACT inhaler Inhale 2 puffs into the lungs every 6 (six) hours as needed for wheezing or shortness of breath. 04/06/20  Yes Martyn Ehrich, NP  aspirin EC 81 MG tablet Take 81 mg by mouth daily.   Yes [provider]  atorvastatin (LIPITOR) 80 MG tablet TAKE 1 TABLET BY MOUTH EVERY DAY 03/23/20  Yes [name], NP  cilostazol (PLETAL) 100 MG tablet Take 1 tablet (100 mg  total) by mouth 2 (two) times daily. 11/23/19  Yes [name], NP  dextromethorphan-guaiFENesin (MUCINEX DM) 30-600 MG 12hr tablet Take 1 tablet by mouth 2 (two) times daily.   Yes [provider]  fluticasone (FLONASE) 50 MCG/ACT nasal spray SPRAY 1 SPRAY INTO EACH NOSTRIL TWICE A DAY Patient taking differently: Place 1 spray into both nostrils 2 (two) times daily as needed for rhinitis.  04/15/20  Yes Martin[place], [name], MD  Fluticasone-Umeclidin-Vilant (TRELEGY ELLIPTA) 200-62.5-25 MCG/INH AEPB Inhale 1 puff into the lungs daily. 04/25/20  Yes Martyn Ehrich, NP  ipratropium-albuterol (DUONEB) 0.5-2.5 (3) MG/3ML SOLN Inhale 3 mLs into the lungs every 6 (six) hours as needed (sob, wheezing). 04/25/20  Yes Martyn Ehrich, NP  levothyroxine (SYNTHROID) 112 MCG tablet TAKE 1 TABLET (112 MCG TOTAL) BY MOUTH DAILY BEFORE BREAKFAST. 12/21/19  Yes [place], [name], MD  LORazepam (ATIVAN) 2 MG tablet TAKE 1 TABLET BY MOUTH AT BEDTIME AS NEEDED FOR ANXIETY Patient taking differently: Take 2 mg by mouth at bedtime.  01/29/20  Yes [place], [name], MD  montelukast (SINGULAIR) 10 MG tablet Take 1 tablet (10 mg total) by mouth at bedtime. 04/29/20  Yes Martyn Ehrich, NP  omeprazole (PRILOSEC) 40 MG capsule TAKE 1 CAPSULE BY MOUTH TWICE A DAY Patient taking differently: Take 40 mg by mouth 2 (two) times daily.  12/21/19  Yes [place], [name], MD    Inpatient Medications: Scheduled Meds: . aspirin EC  81 mg Oral Daily  . atorvastatin  80 mg Oral Daily  . cilostazol  100 mg Oral BID  . dextromethorphan-guaiFENesin  1 tablet Oral BID  . docusate sodium  100 mg Oral BID  . enoxaparin (LOVENOX) injection  40 mg Subcutaneous Q24H  . feeding supplement (ENSURE ENLIVE)  237 mL Oral BID BM  . ipratropium-albuterol  3 mL Nebulization Q6H  . levofloxacin  750 mg Oral Daily  . levothyroxine  112 mcg Oral Q0600  . LORazepam  2 mg Oral QHS  . mometasone-formoterol  1 puff Inhalation BID  . montelukast  10 mg Oral QHS  . pantoprazole  40 mg Oral Daily  . PARoxetine  20 mg Oral QHS  . predniSONE  40 mg Oral Q breakfast  . sodium chloride flush  3 mL Intravenous Q12H  . sodium chloride flush  3 mL Intravenous Q12H   Continuous Infusions:  PRN Meds: acetaminophen **OR** acetaminophen, albuterol, HYDROcodone-acetaminophen, ipratropium-albuterol, ondansetron **OR** ondansetron (ZOFRAN) IV  Allergies:    Allergies  Allergen Reactions  . Sulfa Antibiotics Swelling    Social History:   Social History   Socioeconomic History  . Marital status: Married    Spouse name: Not on file  . Number of children: 3  . Years of education: 61  . Highest education level: Not on file  Occupational History  . Occupation: Chiropractor: RETIRED    Comment: retired  Tobacco Use  . Smoking status: Former Smoker    Packs/day: 0.75    Years: 50.00    Pack years: 37.50    Types: Cigarettes    Quit date: 05/18/2016    Years since quitting: 4.0  . Smokeless tobacco: Never Used  Vaping Use  . Vaping Use: Never used  Substance and Sexual Activity  . Alcohol use: Yes    Alcohol/week: 10.0 standard drinks    Types: 10 Cans of beer per week  . Drug use: No  . Sexual activity: Not on file  Other Topics Concern  . Not on file  Social History Narrative   HSG, beauty school. Married '69 -97 yrs/widowed; married '79 - 22yr/divorced; married '87.  2 sons - '70, '74, 1 srtep-son; 1 grandchild. 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 SO - good health.  NO history of abuse.  ACP - full code and all heroic measures.    Social Determinants of Health   Financial Resource Strain:   . Difficulty of Paying Living Expenses:   Food Insecurity:   . Worried About Charity fundraiser in the Last Year:   . Arboriculturist in the Last Year:   Transportation Needs:   . Film/video editor (Medical):   Marland Kitchen Lack of Transportation (Non-Medical):   Physical Activity:   . Days of Exercise per Week:   . Minutes of  Exercise per Session:   Stress:   . Feeling of Stress :   Social Connections:   . Frequency of Communication with Friends and Family:   . Frequency of Social Gatherings with Friends and Family:   . Attends Religious Services:   . Active Member of Clubs or Organizations:   . Attends Archivist Meetings:   Marland Kitchen Marital Status:   Intimate Partner Violence:   . Fear of Current or Ex-Partner:   . Emotionally Abused:   Marland Kitchen Physically Abused:   . Sexually Abused:     Family History:    Family History  Problem Relation Age of Onset  . Colon cancer Mother        colon  . COPD Mother        brown lung  . Hypertension Mother   . Diabetes Mother   . Emphysema Mother   . Coronary artery disease Father   . Heart attack Father   . Sudden death Father   . Heart disease Father   . Cancer Sister        throat  . Hypertension Sister   . Coronary artery disease Brother   . Heart attack Brother  early 74s  . Throat cancer Sister   . Ovarian cancer Sister        fallopian tube cancer in her 33s  . Melanoma Sister   . Hypertension Sister   . Pancreatic cancer Sister   . Melanoma Niece        dx in her 28s  . Breast cancer Niece 34     ROS:  Please see the history of present illness.  General:no colds or fevers, no weight changes Skin:no rashes or ulcers HEENT:no blurred vision, no congestion CV:see HPI PUL:see HPI GI:no diarrhea constipation or melena, no indigestion GU:no hematuria, no dysuria MS:no joint pain, no claudication Neuro:no syncope, no lightheadedness Endo:no diabetes, + thyroid disease  All other ROS reviewed and negative.     Physical Exam/Data:   Vitals:   05/22/20 1947 05/22/20 2019 05/23/20 0213 05/23/20 0735  BP:  119/88    Pulse:  79    Resp:  (!) 24    Temp:  97.9 F (36.6 C)    TempSrc:  Oral    SpO2: 90% 91% 94% 92%  Weight:      Height:        Intake/Output Summary (Last 24 hours) at 05/23/2020 1131 Last data filed at 05/23/2020  0500 Gross per 24 hour  Intake 480 ml  Output 2050 ml  Net -1570 ml   Last 3 Weights 05/20/2020 04/25/2020 04/06/2020  Weight (lbs) 134 lb 7.7 oz 139 lb 6.4 oz 134 lb  Weight (kg) 61 kg 63.231 kg 60.782 kg     Body mass index is 21.71 kg/m.  General:  Well nourished, thin, in no acute distress HEENT: normal Lymph: no adenopathy Neck: no JVD Endocrine:  No thryomegaly Vascular: No carotid bruits; pedal pulses 2+ bilaterally   Cardiac:  normal S1, S2; RRR; no murmur gallup rub or click Lungs:  clear to auscultation bilaterally posterior, but + rhonchi ant. occ wheeze no rales  Abd: soft, nontender, no hepatomegaly  Ext: no edema Musculoskeletal:  No deformities, BUE and BLE strength normal and equal Skin: warm and dry  Neuro:  Alert and oriented X 3  MAE follows commands no focal abnormalities noted Psych:  Normal affect     Relevant CV Studies: Echo 05/21/20 IMPRESSIONS    1. Left ventricular ejection fraction, by estimation, is 45 to 50%. The  left ventricle has mildly decreased function. The left ventricle  demonstrates global hypokinesis. Indeterminate diastolic filling due to  E-A fusion. Definity contrast administered  but images remain suboptimal to exclude regional wall motion  abnormalities.  2. Right ventricular systolic function is normal. The right ventricular  size is normal. Tricuspid regurgitation signal is inadequate for assessing  PA pressure.  3. Left atrial size was mildly dilated.  4. The mitral valve is degenerative. Trivial mitral valve regurgitation.  Mild mitral stenosis. The mean mitral valve gradient is 3.0 mmHg with  average heart rate of 85 bpm.  5. The aortic valve is grossly normal. Aortic valve regurgitation is not  visualized. No aortic stenosis is present.  6. The inferior vena cava is dilated in size with >50% respiratory  variability, suggesting right atrial pressure of 8 mmHg.   Comparison(s): A prior study was performed on  08/03/2019. Prior images  reviewed side by side. LVEF compared to prior appears reduced.   FINDINGS  Left Ventricle: Left ventricular ejection fraction, by estimation, is 45  to 50%. The left ventricle has mildly decreased function. The left  ventricle demonstrates global  hypokinesis. Definity contrast agent was  given IV to delineate the left ventricular  endocardial borders. The left ventricular internal cavity size was normal  in size. There is no left ventricular hypertrophy. Indeterminate diastolic  filling due to E-A fusion.   Right Ventricle: The right ventricular size is normal. Right vetricular  wall thickness was not assessed. Right ventricular systolic function is  normal. Tricuspid regurgitation signal is inadequate for assessing PA  pressure.   Left Atrium: Left atrial size was mildly dilated.   Right Atrium: Right atrial size was normal in size.   Pericardium: There is no evidence of pericardial effusion.   Mitral Valve: The mitral valve is degenerative in appearance. Normal  mobility of the mitral valve leaflets. Mild to moderate mitral annular  calcification. Trivial mitral valve regurgitation. Mild mitral valve  stenosis. The mean mitral valve gradient is  3.0 mmHg with average heart rate of 85 bpm.   Tricuspid Valve: The tricuspid valve is normal in structure. Tricuspid  valve regurgitation is trivial. No evidence of tricuspid stenosis.   Aortic Valve: The aortic valve is grossly normal. Aortic valve  regurgitation is not visualized. No aortic stenosis is present.   Pulmonic Valve: The pulmonic valve was normal in structure. Pulmonic valve  regurgitation is not visualized. No evidence of pulmonic stenosis.   Aorta: The aortic root is normal in size and structure.   Venous: The inferior vena cava is dilated in size with greater than 50%  respiratory variability, suggesting right atrial pressure of 8 mmHg.   IAS/Shunts: No atrial level shunt detected by  color flow Doppler.      Laboratory Data:  High Sensitivity Troponin:   Recent Labs  Lab 05/20/20 1657 05/20/20 1855  TROPONINIHS 15 12     Chemistry Recent Labs  Lab 05/21/20 0919 05/22/20 0540 05/23/20 0506  NA 138 136 130*  K 4.1 4.2 3.7  CL 100 98 91*  CO2 28 28 28   GLUCOSE 156* 141* 96  BUN 15 27* 21  CREATININE 0.74 0.83 0.87  CALCIUM 9.5 9.2 8.9  GFRNONAA >60 >60 >60  GFRAA >60 >60 >60  ANIONGAP 10 10 11     Recent Labs  Lab 05/21/20 0919  PROT 8.0  ALBUMIN 4.2  AST 29  ALT 32  ALKPHOS 72  BILITOT 0.3   Hematology Recent Labs  Lab 05/20/20 1657 05/22/20 0540 05/23/20 0506  WBC 7.7 8.6 7.9  RBC 4.80 4.65 5.11  HGB 14.7 14.3 15.3*  HCT 44.5 43.3 47.4*  MCV 92.7 93.1 92.8  MCH 30.6 30.8 29.9  MCHC 33.0 33.0 32.3  RDW 14.1 14.2 13.8  PLT 430* 409* 412*   BNP Recent Labs  Lab 05/20/20 1657  BNP 99.7    DDimer No results for input(s): DDIMER in the last 168 hours.   Radiology/Studies:  CT Angio Chest PE W and/or Wo Contrast  Result Date: 05/20/2020 CLINICAL DATA:  Shortness of breath x2 weeks. EXAM: CT ANGIOGRAPHY CHEST WITH CONTRAST TECHNIQUE: Multidetector CT imaging of the chest was performed using the standard protocol during bolus administration of intravenous contrast. Multiplanar CT image reconstructions and MIPs were obtained to evaluate the vascular anatomy. CONTRAST:  177mL OMNIPAQUE IOHEXOL 350 MG/ML SOLN COMPARISON:  December 01, 2019 FINDINGS: Cardiovascular: There is marked severity calcification of the aortic arch with an extensive amount of calcification seen within the origin of the left common carotid artery. Satisfactory opacification of the pulmonary arteries to the segmental level. No evidence of pulmonary embolism. Normal heart  size. No pericardial effusion. Moderate severity coronary artery calcification is noted Mediastinum/Nodes: No enlarged mediastinal, hilar, or axillary lymph nodes. The thyroid gland and trachea  demonstrate no significant findings. There is a large hiatal hernia. Lungs/Pleura: There is marked severity emphysematous lung disease. Ill-defined surgical sutures are seen along the anterior aspect of the right upper lobe. Mild linear scarring is seen within the posterior aspect of the left upper lobe. There is no evidence of acute infiltrate, pleural effusion or pneumothorax. Upper Abdomen: No acute abnormality. Musculoskeletal: A right breast implant is seen. Multilevel degenerative changes seen throughout the thoracic spine. Review of the MIP images confirms the above findings. IMPRESSION: 1. No evidence of pulmonary embolus. 2. Marked severity emphysematous lung disease. 3. Large hiatal hernia. Aortic Atherosclerosis (ICD10-I70.0) and Emphysema (ICD10-J43.9). Electronically Signed   By: Virgina Norfolk M.D.   On: 05/20/2020 21:37   DG Chest Portable 1 View  Result Date: 05/20/2020 CLINICAL DATA:  Cough.  Shortness of breath. EXAM: PORTABLE CHEST 1 VIEW COMPARISON:  April 06, 2020 FINDINGS: Emphysematous changes are again noted. There are postsurgical changes of the right lung apex. Scattered areas of pleuroparenchymal scarring are noted. The heart size is stable. There is no pneumothorax. No significant pleural effusion. IMPRESSION: No active disease. Electronically Signed   By: Constance Holster M.D.   On: 05/20/2020 18:02   ECHOCARDIOGRAM COMPLETE  Result Date: 05/21/2020    ECHOCARDIOGRAM REPORT   Patient Name:   [name] Date of Exam: 05/21/2020 Medical Rec #:  [id number]     Height:       66.0 in Accession #:    [id number]    Weight:       134.5 lb Date of Birth:  15-Dec-1945     BSA:          1.689 m Patient Age:    69 years      BP:           131/94 mmHg Patient Gender: F             HR:           81 bpm. Exam Location:  Inpatient Procedure: 2D Echo STAT ECHO Indications:    Dyspnea R06.00  History:        Patient has prior history of Echocardiogram examinations, most                 recent  08/03/2019. CAD, COPD; Risk Factors:Hypertension and                 Dyslipidemia.  Sonographer:    Mikki Santee RDCS (AE) Referring Phys: [place]  Sonographer Comments: Technically difficult study due to poor echo windows. IMPRESSIONS  1. Left ventricular ejection fraction, by estimation, is 45 to 50%. The left ventricle has mildly decreased function. The left ventricle demonstrates global hypokinesis. Indeterminate diastolic filling due to E-A fusion. Definity contrast administered but images remain suboptimal to exclude regional wall motion abnormalities.  2. Right ventricular systolic function is normal. The right ventricular size is normal. Tricuspid regurgitation signal is inadequate for assessing PA pressure.  3. Left atrial size was mildly dilated.  4. The mitral valve is degenerative. Trivial mitral valve regurgitation. Mild mitral stenosis. The mean mitral valve gradient is 3.0 mmHg with average heart rate of 85 bpm.  5. The aortic valve is grossly normal. Aortic valve regurgitation is not visualized. No aortic stenosis is present.  6. The inferior vena cava is dilated in  size with >50% respiratory variability, suggesting right atrial pressure of 8 mmHg. Comparison(s): A prior study was performed on 08/03/2019. Prior images reviewed side by side. LVEF compared to prior appears reduced. FINDINGS  Left Ventricle: Left ventricular ejection fraction, by estimation, is 45 to 50%. The left ventricle has mildly decreased function. The left ventricle demonstrates global hypokinesis. Definity contrast agent was given IV to delineate the left ventricular  endocardial borders. The left ventricular internal cavity size was normal in size. There is no left ventricular hypertrophy. Indeterminate diastolic filling due to E-A fusion. Right Ventricle: The right ventricular size is normal. Right vetricular wall thickness was not assessed. Right ventricular systolic function is normal. Tricuspid  regurgitation signal is inadequate for assessing PA pressure. Left Atrium: Left atrial size was mildly dilated. Right Atrium: Right atrial size was normal in size. Pericardium: There is no evidence of pericardial effusion. Mitral Valve: The mitral valve is degenerative in appearance. Normal mobility of the mitral valve leaflets. Mild to moderate mitral annular calcification. Trivial mitral valve regurgitation. Mild mitral valve stenosis. The mean mitral valve gradient is 3.0 mmHg with average heart rate of 85 bpm. Tricuspid Valve: The tricuspid valve is normal in structure. Tricuspid valve regurgitation is trivial. No evidence of tricuspid stenosis. Aortic Valve: The aortic valve is grossly normal. Aortic valve regurgitation is not visualized. No aortic stenosis is present. Pulmonic Valve: The pulmonic valve was normal in structure. Pulmonic valve regurgitation is not visualized. No evidence of pulmonic stenosis. Aorta: The aortic root is normal in size and structure. Venous: The inferior vena cava is dilated in size with greater than 50% respiratory variability, suggesting right atrial pressure of 8 mmHg. IAS/Shunts: No atrial level shunt detected by color flow Doppler.  LEFT VENTRICLE PLAX 2D LVIDd:         4.30 cm  Diastology LVIDs:         3.00 cm  LV e' lateral:   5.92 cm/s LV PW:         1.00 cm  LV E/e' lateral: 12.4 LV IVS:        1.00 cm  LV e' medial:    4.88 cm/s LVOT diam:     2.10 cm  LV E/e' medial:  15.1 LV SV:         70 LV SV Index:   41 LVOT Area:     3.46 cm  RIGHT VENTRICLE RV S prime:     15.30 cm/s TAPSE (M-mode): 2.7 cm LEFT ATRIUM             Index       RIGHT ATRIUM           Index LA diam:        3.90 cm 2.31 cm/m  RA Area:     13.80 cm LA Vol (A2C):   51.5 ml 30.48 ml/m RA Volume:   34.60 ml  20.48 ml/m LA Vol (A4C):   51.0 ml 30.19 ml/m LA Biplane Vol: 56.4 ml 33.38 ml/m  AORTIC VALVE LVOT Vmax:   99.80 cm/s LVOT Vmean:  62.300 cm/s LVOT VTI:    0.202 m  AORTA Ao Root diam: 3.00 cm  MITRAL VALVE MV Area (PHT): 3.89 cm     SHUNTS MV Mean grad:  3.0 mmHg     Systemic VTI:  0.20 m MV Decel Time: 195 msec     Systemic Diam: 2.10 cm MV E velocity: 73.50 cm/s MV A velocity: 128.00 cm/s MV E/A ratio:  0.57  Cherlynn Kaiser MD Electronically signed by Cherlynn Kaiser MD Signature Date/Time: 05/21/2020/2:10:00 PM    Final         No chest pain   New York Heart Association (NYHA) Functional Class NYHA Class II  Assessment and Plan:   1.  COPD exacerbation with hypoxia and mild CHF.  Treated HF with 2 doses of Lasix.    2.  DOE with known CAD and new EKG changes, troponin neg. + CAD on chest CTA.  Concern for increase of her CAD playing a role with her dyspnea in combination of COPD.  Cardiac cath would be beneficial to evaluate.   Will discuss timing Christy. Margaretann Loveless to see.  -- either Wed or Thursday  This week she would prefer here.   3.  CAD last cath 2012   4.  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 Followed by [name].  Stable though episodes of diarrhea.   5.  Hx tobacco use, recurrent for a month or so in Jan 2021 but no longer using tobacco.    6.  HLD has been on lipitor continue          For questions or updates, please contact [place] Please consult [email or web address] for contact info under    Signed, Cecilie Kicks, NP  05/23/2020 11:31 AM   Patient seen and examined with Cecilie Kicks NP.  Agree as above, with the following exceptions and changes as noted below.  [name] is a pleasant 74 year old female with a history of COPD currently admitted with significant COPD exacerbation.  Cardiology consulted for newly reduced ejection fraction compared to prior.  I personally interpreted this echocardiogram and noted a mild reduction in ejection fraction from prior 60 to 65% now 45 to 50% with no regional wall motion abnormalities.  She also has T wave inversions on her ECG.  Fortunately troponins are essentially unremarkable and flat.  She feels much  better with treatment of her COPD exacerbation, but remains significantly short of breath with minimal ambulation.  She cannot yet lie flat.  Gen: NAD, CV: RRR, no murmurs, Lungs: No accessory muscle use, coarse bilateral breath sounds, Abd: soft, Extrem: Warm, no edema, Neuro/Psych: alert and oriented x 3, normal mood and affect. All available labs, radiology testing, previous records reviewed.  The patient and I discussed in detail finding of mildly reduced ejection fraction in the setting of an abnormal EKG.  I reviewed her CT PE study which showed dense coronary calcifications in the proximal LAD and ostial to proximal RCA based on a non-ECG gated study.  We discussed options for ischemic evaluation.  Calcium blooming artifact will make interpretation of coronary CT challenging.  She is currently wheezing and Lexiscan Myoview would also not be an appropriate choice.  With evidence of coronary disease on her CT PE study, would recommend diagnostic coronary angiography.  I have discussed this with the patient and she agrees that this would be better performed while in hospital as opposed to waiting for outpatient management.  She cannot yet lie flat and coronary angiography would not be appropriate until this can be easily accomplished.  I have discussed with the patient that hopefully by midweek we can plan for left heart catheterization.  Elouise Munroe, MD 05/23/20 2:52 PM

## 2020-05-23 NOTE — TOC Initial Note (Signed)
Transition of Care [facility]) - Initial/Assessment Note    Patient Details  Name: [name] MRN: [id number] Date of Birth: 1945/11/17  Transition [facility]) CM/SW Contact:    Joaquin Courts, RN Phone Number: 05/23/2020, 1:49 PM  Clinical Narrative:     CM spoke with patient regarding recommendation for HHPT.  Patient is in agreement and set up with [facility] for HHPT.  MD please place orders.               Expected Discharge Plan: [place] Barriers to Discharge: Continued Medical Work up   Patient Goals and CMS Choice Patient states their goals for this hospitalization and ongoing recovery are:: to go home with therapy CMS Medicare.gov Compare Post Acute Care list provided to:: Patient Choice offered to / list presented to : Patient  Expected Discharge Plan and Services Expected Discharge Plan: [place]   Discharge Planning Services: CM Consult Post Acute Care Choice: [place] arrangements for the past 2 months: Single Family Home                 DME Arranged: N/A DME Agency: NA       HH Arranged: PT HH Agency: [facility] Date HH Agency Contacted: 05/23/20 Time [place]: [id number] Representative spoke with at [place]: [place] Arrangements/Services Living arrangements for the past 2 months: [place]   Patient language and need for interpreter reviewed:: Yes Do you feel safe going back to the place where you live?: Yes      Need for Family Participation in Patient Care: Yes (Comment) Care giver support system in place?: Yes (comment)   Criminal Activity/Legal Involvement Pertinent to Current Situation/Hospitalization: No - Comment as needed  Activities of Daily Living Home Assistive Devices/Equipment: Hearing aid, Nebulizer, Oxygen, Blood pressure cuff, Scales ADL Screening (condition at time of admission) Patient's cognitive ability adequate to safely complete daily  activities?: Yes Is the patient deaf or have difficulty hearing?: Yes Does the patient have difficulty seeing, even when wearing glasses/contacts?: No Does the patient have difficulty concentrating, remembering, or making decisions?: No Patient able to express need for assistance with ADLs?: Yes Does the patient have difficulty dressing or bathing?: No Independently performs ADLs?: Yes (appropriate for developmental age) Does the patient have difficulty walking or climbing stairs?: Yes Weakness of Legs: Both Weakness of Arms/Hands: None  Permission Sought/Granted                  Emotional Assessment Appearance:: Appears stated age Attitude/Demeanor/Rapport: Engaged Affect (typically observed): Accepting Orientation: : Oriented to Self, Oriented to Place, Oriented to  Time, Oriented to Situation   Psych Involvement: No (comment)  Admission diagnosis:  Shortness of breath [R06.02] COPD exacerbation ([place]) [J44.1] Patient Active Problem List   Diagnosis Date Noted  . Acute systolic CHF (congestive heart failure) ([place]) 05/21/2020  . Major depressive disorder, single episode, moderate degree ([place]) 05/21/2020  . COPD exacerbation ([place]) 05/20/2020  . Acute on chronic respiratory failure with hypoxia ([place]) 05/20/2020  . Genetic testing 12/12/2018  . Family history of breast cancer   . Family history of ovarian cancer   . Family history of pancreatic cancer   . Family history of colon cancer   . Family history of melanoma   . History of breast cancer   . Chronic respiratory failure with hypoxia ([place]) 10/14/2017  . COPD with acute exacerbation ([place]) 10/10/2017  .  Flu vaccine need 09/09/2017  . Anxiety disorder 09/09/2017  . Fibromyalgia 09/09/2017  . Thrombocytosis ([place]) 06/21/2017  . Malignant neoplasm of overlapping sites of right breast in female, estrogen receptor positive ([place]) 06/05/2017  . Edema of right lower extremity 02/26/2017  . Positive ANA (antinuclear antibody)  02/08/2017  . Elevated IgE level and eosinophilia 12/31/2016  . Oral thrush 11/28/2016  . Rash and nonspecific skin eruption 11/28/2016  . Allergic rhinitis 11/28/2016  . History of bacterial pneumonia 08/09/2016  . Routine general medical examination at a health care facility 08/12/2015  . Insomnia 03/04/2015  . Breast cancer, right breast ([place]) 02/17/2015  . CAD (coronary artery disease) 07/11/2011  . Tobacco abuse 03/27/2011  . Chronic mesenteric ischemia ([place]) 03/27/2011  . Solitary pulmonary nodule 03/27/2011  . PAD (peripheral artery disease) ([place]) 03/04/2011  . Arthropathy 11/09/2010  . Hypothyroidism 09/20/2009  . Hyperlipidemia 09/20/2009  . Hearing loss 09/14/2008  . Essential hypertension 09/08/2007  . COPD, frequent exacerbations ([place]) 09/08/2007  . GERD 09/08/2007   PCP:  [place], [name], MD Pharmacy:   [facility], [place] - [address] [place]  [address] Phone: [phone] Fax: [phone]     Social Determinants of Health (SDOH) Interventions    Readmission Risk Interventions No flowsheet data found.

## 2020-05-23 NOTE — Progress Notes (Signed)
Initial Nutrition Assessment  DOCUMENTATION CODES:   Not applicable  INTERVENTION:  - continue Ensure Enlive but decrease from BID to once/day, each supplement provides 350 kcal and 20 grams of protein. - will complete NFPE at follow-up.   NUTRITION DIAGNOSIS:   Increased nutrient needs related to acute illness, chronic illness as evidenced by estimated needs.  GOAL:   Patient will meet greater than or equal to 90% of their needs  MONITOR:   PO intake, Supplement acceptance, Labs, Weight trends  REASON FOR ASSESSMENT:   Malnutrition Screening Tool  ASSESSMENT:   74 year-old female with medical history of GERD, HTN, osteoporosis, hyperlipidemia, hypothyroidism, dyspnea with minimal exertion, bladder neoplasm, R breast cancer, PVD, DVTs of BLE, multiple pulmonary nodules, CAD, fibromyalgia, diverticulitis, and hard of hearing. She presented to the ED due to shortness of breath.  Patient ate 100% of breakfast and 100% of lunch yesterday (total of 1003 kcal, 39 grams protein). Ensure Enlive was ordered BID yesterday. She declined both bottles yesterday but did accept one this AM.   Patient reports that her son died unexpectedly last 07/19/23 and that she has had a tough time with this emotionally and has continued to grieve his loss. Patient has not been sleeping well or focusing on eating since that time.   Unable to complete NFPE at this time. Per chart review, weight on 8/6 was 134 lb, weight on 7/12 was 139 lb (5 lb weight loss/3.6% body weight x1 month; not significant), and weight on 6/23 was 134 lb.   Per notes: - COPD exacerbation - anxiety and heart failure contributing to acute on chronic respiratory failure - acute CHF - moderate major depressive disorder--has not met with a counselor or started any medications to help with depression since her son's passing - chronic mesenteric ischemia on multiple meds   Labs reviewed; Na: 130 mmol/l, Cl: 91 mmol/l. Medications  reviewed; 100 mg colace BID, 112 mcg oral synthroid/day, 40 mg oral protonix/day, 40 mg deltasone/day.     NUTRITION - FOCUSED PHYSICAL EXAM:  unable to complete at this time.   Diet Order:   Diet Order            Diet Heart Room service appropriate? Yes; Fluid consistency: Thin  Diet effective now                 EDUCATION NEEDS:   No education needs have been identified at this time  Skin:  Skin Assessment: Reviewed RN Assessment  Last BM:  8/6  Height:   Ht Readings from Last 1 Encounters:  05/20/20 _0  (1.676 m)    Weight:   Wt Readings from Last 1 Encounters:  05/20/20 61 kg    Estimated Nutritional Needs:  Kcal:  1700-1900 kcal Protein:  85-95 grams Fluid:  >/= 1.7 L/day     Jarome Matin, MS, RD, LDN, CNSC Inpatient Clinical Dietitian RD pager # available in AMION  After hours/weekend pager # available in [facility]

## 2020-05-24 ENCOUNTER — Encounter: Payer: Medicare Other | Admitting: Physical Therapy

## 2020-05-24 DIAGNOSIS — I2510 Atherosclerotic heart disease of native coronary artery without angina pectoris: Secondary | ICD-10-CM

## 2020-05-24 DIAGNOSIS — I5021 Acute systolic (congestive) heart failure: Secondary | ICD-10-CM

## 2020-05-24 LAB — BASIC METABOLIC PANEL
Anion gap: 8 (ref 5–15)
BUN: 17 mg/dL (ref 8–23)
CO2: 28 mmol/L (ref 22–32)
Calcium: 8.8 mg/dL — ABNORMAL LOW (ref 8.9–10.3)
Chloride: 98 mmol/L (ref 98–111)
Creatinine, Ser: 0.76 mg/dL (ref 0.44–1.00)
GFR calc Af Amer: >60 mL/min (ref >60)
GFR calc non Af Amer: >60 mL/min (ref >60)
Glucose, Bld: 96 mg/dL (ref 70–99)
Potassium: 3.8 mmol/L (ref 3.5–5.1)
Sodium: 134 mmol/L — ABNORMAL LOW (ref 135–145)

## 2020-05-24 LAB — CBC WITH DIFFERENTIAL/PLATELET
Abs Immature Granulocytes: 0.04 10*3/uL (ref 0.00–0.07)
Basophils Absolute: 0 10*3/uL (ref 0.0–0.1)
Basophils Relative: 0 %
Eosinophils Absolute: 0 10*3/uL (ref 0.0–0.5)
Eosinophils Relative: 1 %
HCT: 45.1 % (ref 36.0–46.0)
Hemoglobin: 14.6 g/dL (ref 12.0–15.0)
Immature Granulocytes: 1 %
Lymphocytes Relative: 17 %
Lymphs Abs: 1.3 10*3/uL (ref 0.7–4.0)
MCH: 30.1 pg (ref 26.0–34.0)
MCHC: 32.4 g/dL (ref 30.0–36.0)
MCV: 93 fL (ref 80.0–100.0)
Monocytes Absolute: 1.1 10*3/uL — ABNORMAL HIGH (ref 0.1–1.0)
Monocytes Relative: 14 %
Neutro Abs: 5.2 10*3/uL (ref 1.7–7.7)
Neutrophils Relative %: 67 %
Platelets: 389 10*3/uL (ref 150–400)
RBC: 4.85 MIL/uL (ref 3.87–5.11)
RDW: 13.5 % (ref 11.5–15.5)
WBC: 7.7 10*3/uL (ref 4.0–10.5)
nRBC: 0 % (ref 0.0–0.2)

## 2020-05-24 LAB — MAGNESIUM: Magnesium: 2.1 mg/dL (ref 1.7–2.4)

## 2020-05-24 MED ORDER — SODIUM CHLORIDE 0.9 % IV SOLN: INTRAVENOUS | Status: DC

## 2020-05-24 MED ORDER — SODIUM CHLORIDE 0.9 % IV SOLN: 250.0000 mL | INTRAVENOUS | Status: DC | PRN

## 2020-05-24 MED ORDER — SODIUM CHLORIDE 0.9% FLUSH: 3.0000 mL | INTRAVENOUS | Status: DC | PRN

## 2020-05-24 MED ORDER — DM-GUAIFENESIN ER 30-600 MG PO TB12
2.0000 | ORAL_TABLET | Freq: Two times a day (BID) | ORAL | Status: DC
  Administered 2020-05-24 – 2020-05-28 (×8): 2 via ORAL
  Filled 2020-05-24 (×8): qty 2

## 2020-05-24 MED ORDER — SODIUM CHLORIDE 0.9% FLUSH
3.0000 mL | Freq: Two times a day (BID) | INTRAVENOUS | Status: DC
  Administered 2020-05-24: 3 mL via INTRAVENOUS

## 2020-05-24 MED ORDER — ASPIRIN 81 MG PO CHEW
81.0000 mg | CHEWABLE_TABLET | ORAL | Status: AC
  Administered 2020-05-25: 81 mg via ORAL
  Filled 2020-05-24: qty 1

## 2020-05-24 NOTE — Progress Notes (Signed)
NAME:  [name], MRN:  [id number], DOB:  26-Nov-1945, LOS: 4 ADMISSION DATE:  05/20/2020, CONSULTATION DATE:  05/21/2020 REFERRING MD:  [facility], CHIEF COMPLAINT: Shortness of breath  Brief History   74 year old female with extensive past medical history is followed by Dr. Chase Caller has not felt well since September 2020. Frequent exacerbations of respiratory treatment with multiple antimicrobial steroids O2 dependent at 2 L nocturnal oxygen today. Reports have increased wheezing shortness of breath denies chest pain. She has a cough that is nonproductive. She presented multiple times in the office and there was to be seen on 05/21/2019 office unfortunately the office while. Patient presented with emergency department for further evaluation and treatment. She was admitted by [facility] hospitalist team pulmonary critical care was asked to evaluate 05/21/2020.  Past Medical History   Past Medical History:  Diagnosis Date   Bilateral leg cramps    Bladder neoplasm    Chronic deep vein thrombosis (DVT) of right lower extremity ([place])    05/ 2018  chronic non-occlusive DVT RLE   COPD, frequent exacerbations ([place])    03-06-2018 per pt last exacerbation 12/ 2018   Coronary artery disease    cardiologist-  dr Aundra Dubin-- 03-11-2018 er cath , 80% stenosis in the ostial second diagonal   Dyspnea on minimal exertion    Emphysema/COPD ([place])    CAT score- 17   Family history of breast cancer    Family history of colon cancer    Family history of melanoma    Family history of ovarian cancer    Family history of pancreatic cancer    Fibromyalgia 1995   GERD (gastroesophageal reflux disease)    Hiatal hernia    History of breast cancer    History of diverticulitis of colon    2002  s/p  sigmoid colectomy   History of multiple pulmonary nodules    hx RUL nodules x2  s/p right VATS w/ wedge resection 09-11-2005 and 06-14-2011  both  necrotizing granulomatous inflammation w/ cystic area of  necrosis & focal calcification   History of right breast cancer oncologist-  dr Jana Hakim-- no recurrence   dx 2010--  IDC, Stage IA , ER/PR+,  (pT1c pN0) 11-09-2008 right lumpectomy;  12-18-2008 right simple mastectomy for DCIS margins;  completed chemotherapy 2010 (no radiation) and completed antiestrogen therapy   Hx of colonic polyps    Dr. 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   Nocturia    OA (osteoarthritis)    thumbs   On supplemental oxygen therapy    03-06-2018 per pt uses only at night,  checks O2 sats at home,  stated am sat 89% after moving around average 93-94% with RA   Osteoporosis    Peripheral arterial occlusive disease ([place]) vascular-- dr chen/ dr Donzetta Matters   proximal right SFA severe focal stenosis with collaterals from the PFA/  04/ 2012 occluded celiac and SMA arteries with distal reconstitution w/ patent IMA   Peripheral vascular disease ([place])    chronic DVT RLE,  mesenteric vascular disease   Right lower lobe pulmonary nodule    Chest CT 08-29-2017   Stage 3 severe COPD by GOLD classification ([place]) hx frequent exacerbations--   pulmologist-  dr Maryann Alar--  per lov note , dated 12-20-2017, oxyogen 2L is prescribed for use with exertion (but pt only uses mostly at night), O2 sats  on RA run the 80s , this day sat 86% RA and with 2L O2 sat 92%   Varicose veins of leg with swelling    varicose vein surgery - Dr. Aleda Grana   Wears glasses    Wears hearing aid in both ears    Significant Hospital Events    Consults:  Pulmonary 05/21/2020  Procedures:  None  Significant Diagnostic Tests:  Echocardiogram  Micro Data:    Antimicrobials:   Levaquin8/7 >  Interim history/subjective:  Breathing feels a little bit better  Objective   Blood pressure 110/83, pulse 82, temperature 98.3 F (36.8 C), temperature source Oral, resp. rate 17,  height 5\' 6"  (1.676 m), weight 61 kg, SpO2 (!) 83 %.        Intake/Output Summary (Last 24 hours) at 05/24/2020 1125 Last data filed at 05/24/2020 0522 Gross per 24 hour  Intake 1500 ml  Output --  Net 1500 ml   Filed Weights   05/20/20 1622  Weight: 61 kg    Examination: General: Elderly, appears comfortable HENT: Moist oral mucosa Lungs: Decreased air movement bilaterally, rhonchi Cardiovascular: S1-S2 appreciated Abdomen: Bowel sounds appreciated Extremities: No clubbing, no edema Neuro: Alert and oriented GU:   Resolved Hospital Problem list     Assessment & Plan:  Acute on chronic hypoxemic respiratory failure in the setting of COPD Acute pulmonary edema -Continue oxygen supplementation -Cautious diuresis -Continue bronchodilators -Complete course of antibiotics-on oral Levaquin -Continue steroids-currently on prednisone p.o.  Decompensated heart failure -Cautious diuresis -Decreased ejection fraction on echocardiogram with a past history of coronary artery disease -Scheduled for cardiac catheterization 05/25/2020  Hypertension  Continue to optimize respiratory status She wheezes/sounds rhonchorous most of the time Labs   CBC: Recent Labs  Lab 05/20/20 1657 05/22/20 0540 05/23/20 0506 05/24/20 0521  WBC 7.7 8.6 7.9 7.7  NEUTROABS 6.9 6.8 5.0 5.2  HGB 14.7 14.3 15.3* 14.6  HCT 44.5 43.3 47.4* 45.1  MCV 92.7 93.1 92.8 93.0  PLT 430* 409* 412* 350    Basic Metabolic Panel: Recent Labs  Lab 05/20/20 1657 05/20/20 1855 05/21/20 0919 05/22/20 0540 05/23/20 0506 05/24/20 0521  NA 141  --  138 136 130* 134*  K 4.6  --  4.1 4.2 3.7 3.8  CL 103  --  100 98 91* 98  CO2 26  --  28 28 28 28   GLUCOSE 123*  --  156* 141* 96 96  BUN 16  --  15 27* 21 17  CREATININE 0.80  --  0.74 0.83 0.87 0.76  CALCIUM 9.4  --  9.5 9.2 8.9 8.8*  MG  --  2.1 2.1 2.4 2.2 2.1  PHOS  --   --  3.6  --   --   --    GFR: Estimated Creatinine Clearance: 56.9 mL/min (by  C-G formula based on SCr of 0.76 mg/dL). Recent Labs  Lab 05/20/20 1657 05/22/20 0540 05/23/20 0506 05/24/20 0521  WBC 7.7 8.6 7.9 7.7    Liver Function Tests: Recent Labs  Lab 05/21/20 0919  AST 29  ALT 32  ALKPHOS 72  BILITOT 0.3  PROT 8.0  ALBUMIN 4.2   No results for input(s): LIPASE, AMYLASE in the last 168 hours. No results for input(s): AMMONIA in the last 168 hours.  ABG    Component Value Date/Time   PHART 7.325 (L) 11/16/2016 1630   PCO2ART 53.9 (H) 11/16/2016 1630   PO2ART 57.2 (L) 11/16/2016 1630   HCO3 26.1 05/20/2020 2230  TCO2 30 06/15/2011 0357   ACIDBASEDEF 1.0 06/14/2011 1636   O2SAT 92.7 05/20/2020 2230     Coagulation Profile: No results for input(s): INR, PROTIME in the last 168 hours.  Cardiac Enzymes: No results for input(s): CKTOTAL, CKMB, CKMBINDEX, TROPONINI in the last 168 hours.  HbA1C: Hgb A1c MFr Bld  Date/Time Value Ref Range Status  01/03/2018 04:01 PM 5.7 (H) <5.7 % of total Hgb Final    Comment:    For someone without known diabetes, a hemoglobin  A1c value between 5.7% and 6.4% is consistent with prediabetes and should be confirmed with a  follow-up test. . For someone with known diabetes, a value <7% indicates that their diabetes is well controlled. A1c targets should be individualized based on duration of diabetes, age, comorbid conditions, and other considerations. . This assay result is consistent with an increased risk of diabetes. . Currently, no consensus exists regarding use of hemoglobin A1c for diagnosis of diabetes for children. .     CBG: No results for input(s): GLUCAP in the last 168 hours.  Review of Systems:   Feeling gradually better  Past Medical History  She,  has a past medical history of Bilateral leg cramps, Bladder neoplasm, Chronic deep vein thrombosis (DVT) of right lower extremity ([place]), COPD, frequent exacerbations ([place]), Coronary artery disease, Dyspnea on minimal exertion,  Emphysema/COPD ([place]), Family history of breast cancer, Family history of colon cancer, Family history of melanoma, Family history of ovarian cancer, Family history of pancreatic cancer, Fibromyalgia (1995), GERD (gastroesophageal reflux disease), Hiatal hernia, History of breast cancer, History of diverticulitis of colon, History of multiple pulmonary nodules, History of right breast cancer (oncologist-  dr Jana Hakim-- no recurrence), colonic polyps, Hyperlipidemia, Hypertension, Hypothyroidism, Intestinal angina ([place]), Nocturia, OA (osteoarthritis), On supplemental oxygen therapy, Osteoporosis, Peripheral arterial occlusive disease ([place]) (vascular-- dr chen/ dr Donzetta Matters), Peripheral vascular disease ([place]), Right lower lobe pulmonary nodule, Stage 3 severe COPD by GOLD classification ([place]) (hx frequent exacerbations--), Varicose veins of leg with swelling, Wears glasses, and Wears hearing aid in both ears.   Surgical History    Past Surgical History:  Procedure Laterality Date   BREAST EXCISIONAL BIOPSY Left 10/15/2008   BREAST LUMPECTOMY W/ NEEDLE LOCALIZATION Right 11-09-2008    dr cornett   [place]  03-12-2011   dr Aundra Dubin   70-80% ostial stenosis in small second diagonal (appears to small for intervention),  dLAD 40-50%,  minimal luminal irregulartieis involoving LCFx and RCA,  LVEF 55%   CATARACT EXTRACTION W/ INTRAOCULAR LENS  IMPLANT, BILATERAL  2011   CYSTOSCOPY N/A 03/11/2018   Procedure: CYSTOSCOPY WITH INSTILLATION OF POST OPERATIVE EPIRUBICIN;  Surgeon: Festus Aloe, MD;  Location: [facility];  Service: Urology;  Laterality: N/A;   MASTECTOMY Right    PARTIAL COLECTOMY  2002   sigmoid and appectomy (diverticulitis)   PARTIAL MASTECTOMY WITH NEEDLE LOCALIZATION Left 02/23/2013   Procedure:  LEFT PARTIAL MASTECTOMY WITH NEEDLE LOCALIZATION;  Surgeon: Adin Hector, MD;  Location: [facility];  Service: General;  Laterality: Left;   RECONSTRUCTION  BREAST W/ LATISSIMUS DORSI FLAP Right 05-30-2009   dr Harlow Mares  [facility]   REDUCTION MAMMAPLASTY Left    SIMPLE MASTECTOMY Right 12-28-2008    dr Brantley Stage  [facility]   TRANSTHORACIC ECHOCARDIOGRAM  07-09-2016  dr Aundra Dubin   ef 55-60%, grade 1 diastoic dysfunction/  mild TR   TRANSURETHRAL RESECTION OF BLADDER TUMOR N/A 03/11/2018   Procedure: TRANSURETHRAL RESECTION OF BLADDER TUMOR (TURBT) 2-5cm;  Surgeon: Festus Aloe, MD;  Location: [facility];  Service: Urology;  Laterality: N/A;   VAGINAL HYSTERECTOMY  1973   VIDEO ASSISTED THORACOSCOPY (VATS)/WEDGE RESECTION Right 09-11-2005  &  06-14-2011   dr Fara Boros  [facility]   both RUL     Social History   reports that she quit smoking about 4 years ago. Her smoking use included cigarettes. She has a 37.50 pack-year smoking history. She has never used smokeless tobacco. She reports current alcohol use of about 10.0 standard drinks of alcohol per week. She reports that she does not use drugs.   Family History   Her family history includes Breast cancer (age of onset: 26) in her niece; COPD in her mother; Cancer in her sister; Colon cancer in her mother; Coronary artery disease in her brother and father; Diabetes in her mother; Emphysema in her mother; Heart attack in her brother and father; Heart disease in her father; Hypertension in her mother, sister, and sister; Melanoma in her niece and sister; Ovarian cancer in her sister; Pancreatic cancer in her sister; Sudden death in her father; Throat cancer in her sister.   Allergies Allergies  Allergen Reactions   Sulfa Antibiotics Swelling    Sherrilyn Rist, MD [place] [facility] Pager: [phone]

## 2020-05-24 NOTE — Progress Notes (Signed)
Occupational Therapy Treatment Patient Details Name: [name] MRN: [id number] DOB: December 02, 1945 Today's Date: 05/24/2020    History of present illness [name] is a 74 year old Caucasian female with PMH COPD (on 2 L chronic nasal cannula at home), ongoing grief/mourning due to son passing away Jul 09, 2019, hard of hearing, PVD, hypothyroidism, hypertension, hyperlipidemia, diverticulosis, CAD who presented to the hospital with worsening dyspnea at home.  She states that she has had multiple episodes over the past year of severe shortness of breath and has been on multiple rounds of steroids and antibiotics.  She associates much of her dyspnea due to stress episodes centered around grieving after her son passed away last 2023-07-09   OT comments  Patient progressing and showed improved ability to perform light exercise in form of 5 sit<>stand x 2 sets from recliner without use of UEs while maintaining SpO2 at 89-91% on 2L via , compared to previous session.  Pt also completed energy conservation education with good understanding demonstrated. Patient limited by impaired activity tolerance along with deficits noted below. Pt continues to demonstrate good rehab potential and would benefit from continued skilled OT to increase safety and independence with ADLs and functional transfers to allow pt to return home safely and reduce caregiver burden and fall risk.   Follow Up Recommendations  Supervision - Intermittent    Equipment Recommendations  None recommended by OT    Recommendations for Other Services      Precautions / Restrictions Precautions Precautions: Fall Precaution Comments: watch oxygen sats. 2L at baseline, 2L currently Restrictions Weight Bearing Restrictions: No       Mobility Bed Mobility  NT-up in chair             General bed mobility comments: pt OOB in recliner  Transfers     Transfers: Sit to/from Stand Sit to Stand: Supervision          General transfer comment: Pt performed 5 reps x 2 of sit <> stands without use of UEs, from recliner without AD with pulse ox on for biofeedback to help pt determine when rest breaks are indicated.  Transfers with supervision for safety only.  Pt stood on her own and arrnaged linens on her chair with supervision.    Balance Overall balance assessment: Modified Independent                                         ADL either performed or assessed with clinical judgement   ADL Overall ADL's :  (Reviewed energy conservation as it relates to both B and IADLs.)                                             Vision       Perception     Praxis      Cognition Arousal/Alertness: Awake/alert Behavior During Therapy: WFL for tasks assessed/performed Overall Cognitive Status: Within Functional Limits for tasks assessed                                 General Comments: Very pleasant        Exercises     Shoulder Instructions       General Comments  Pertinent Vitals/ Pain       Pain Assessment: No/denies pain  Home Living                                          Prior Functioning/Environment              Frequency  Min 2X/week        Progress Toward Goals  OT Goals(current goals can now be found in the care plan section)  Progress towards OT goals: Progressing toward goals  Acute Rehab OT Goals Patient Stated Goal: home OT Goal Formulation: With patient Time For Goal Achievement: 06/05/20 Potential to Achieve Goals: Good  Plan Discharge plan remains appropriate    Co-evaluation                 AM-PAC OT "6 Clicks" Daily Activity     Outcome Measure   Help from another person eating meals?: None Help from another person taking care of personal grooming?: None Help from another person toileting, which includes using toliet, bedpan, or urinal?: A Little Help from another person  bathing (including washing, rinsing, drying)?: A Little Help from another person to put on and taking off regular upper body clothing?: A Little Help from another person to put on and taking off regular lower body clothing?: A Little 6 Click Score: 20    End of Session    OT Visit Diagnosis: Unsteadiness on feet (R26.81);Muscle weakness (generalized) (M62.81);Other abnormalities of gait and mobility (R26.89)   Activity Tolerance Patient tolerated treatment well   Patient Left in chair;with call bell/phone within reach   Nurse Communication Mobility status        Time: 1420-1441 OT Time Calculation (min): 21 min  Charges: OT General Charges $OT Visit: 1 Visit OT Treatments $Self Care/Home Management : 8-22 mins  Anderson Malta, [place] Office: [phone] 05/24/2020    Julien Girt 05/24/2020, 2:48 PM

## 2020-05-24 NOTE — Progress Notes (Signed)
Patient in bathroom upon RT arrival for scheduled tx. Patient not wearing O2 and O2 sats 83%. Patient to be placed back on O2 after tx.

## 2020-05-24 NOTE — Progress Notes (Signed)
PROGRESS NOTE    [name]  DTO:[id number] DOB: 1946/04/17 DOA: 05/20/2020 PCP: [place], [name], MD   Brief Narrative:  Patient is a 74 year old female with history of COPD on 2 L of oxygen at home, peripheral vascular disease, hypothyroidism, hypertension, hyperlipidemia, diverticulosis, coronary artery  disease who presents to the emergency department complaints of dyspnea on exertion. Patient reported dyspnea over the past year and has been on multiple rounds of steroids/antibiotics.  She was also significantly stressed and on grief due to her son passing away in September 2020.  Patient was suspected to have COPD exacerbation.  She was started on steroids, antibiotic.  PCCM is also following.  Echocardiogram done on this admission showed reduced ejection fraction.  Cardiology consulted.  Plan for cardiac cath tomorrow.  Assessment & Plan:   Active Problems:   Hypothyroidism   Hyperlipidemia   Essential hypertension   GERD   Chronic mesenteric ischemia (HCC)   CAD (coronary artery disease)   Breast cancer, right breast (HCC)   COPD with acute exacerbation (HCC)   COPD exacerbation (HCC)   Acute on chronic respiratory failure with hypoxia (HCC)   Acute systolic CHF (congestive heart failure) (HCC)   Major depressive disorder, single episode, moderate degree (HCC)   Acute on chronic respiratory failure with hypoxia: Multifactorial from COPD exacerbation and possible systolic congestive heart failure.  She was given few doses of Lasix.  She was treated with IV steroids which has been changed to oral now. Cardiology and PCCM following.  COPD exacerbation: Follows with pulmonology as an outpatient.  Takes trelegy at home.  Continue Singulair.  Pulmonology following here.  IV steroids changed to oral.  Also on Levaquin for COPD exacerbation treatment.  Continue bronchodilators as needed.  Continue supplemental oxygen as needed.  She is on 2 L of oxygen at home.  Systolic congestive  heart failure: Echocardiogram done on this admission showed reduced ejection fraction to 40 to 45%, left ventricle global hypokinesis.  Previous echo done in 2020 October showed ejection fraction of 60 to 65%.  Chest x-ray showed mild pulmonary edema.  She was given few days of Lasix.  She still appears congested.  Troponins have been negative.  Cardiology consulted, plan for cardiac cath.  Continue to monitor input/output, daily weight  Coronary artery disease: Continue aspirin, Lipitor.  Denies any chest pain.  Chronic mesenteric ischemia: Continue Plavix, Pletal, Lipitor  Hypothyroidism: Continue Synthyroid  Hyperlipidemia: Continue statin  Hypertension: Currently blood pressure stable.  Continue current regimen  GERD: Continue PPI  Major depressive order/grief: Patient significantly depressed, stressed due to her son passing away on June 25, 2019.  Reported lack of sleep.  Start on Paxil.          Nutrition Problem: Increased nutrient needs Etiology: acute illness, chronic illness      DVT prophylaxis:Lovenox Code Status: Full Family Communication: Husband at the bedside Status is: Inpatient  Remains inpatient appropriate because:Ongoing diagnostic testing needed not appropriate for outpatient work up   Dispo: The patient is from: Home              Anticipated d/c is to: Home              Anticipated d/c date is: 2 days              Patient currently is not medically stable to d/c.     Consultants: Cardiology,PCCM  Procedures:None  Antimicrobials:  Anti-infectives (From admission, onward)   Start  Dose/Rate Route Frequency Ordered Stop   05/22/20 0800  levofloxacin (LEVAQUIN) tablet 750 mg     Discontinue     750 mg Oral Daily 05/21/20 1323     05/22/20 0730  azithromycin (ZITHROMAX) tablet 500 mg  Status:  Discontinued       "Followed by" Linked Group Details   500 mg Oral Daily 05/21/20 0726 05/21/20 0841   05/21/20 1000  levofloxacin (LEVAQUIN)  IVPB 500 mg  Status:  Discontinued        500 mg 100 mL/hr over 60 Minutes Intravenous Every 24 hours 05/21/20 0841 05/21/20 1323   05/21/20 0726  azithromycin (ZITHROMAX) 500 mg in sodium chloride 0.9 % 250 mL IVPB  Status:  Discontinued       "Followed by" Linked Group Details   500 mg 250 mL/hr over 60 Minutes Intravenous Every 24 hours 05/21/20 0726 05/21/20 0841      Subjective: Patient seen and examined the bedside this morning.  Currently hemodynamically stable.  She states she feels better than yesterday but she still appears very congested, coughing  Objective: Vitals:   05/24/20 0521 05/24/20 0600 05/24/20 0700 05/24/20 0827  BP: 110/83     Pulse: 82     Resp:  14 17   Temp: 98.3 F (36.8 C)     TempSrc: Oral     SpO2: 92%   (!) 83%  Weight:      Height:        Intake/Output Summary (Last 24 hours) at 05/24/2020 1036 Last data filed at 05/24/2020 0522 Gross per 24 hour  Intake 1500 ml  Output --  Net 1500 ml   Filed Weights   05/20/20 1622  Weight: 61 kg    Examination:  General exam: Not in distress,average built HEENT:PERRL,Oral mucosa moist, Ear/Nose normal on gross exam Respiratory system: Bilateral rhonchi,basal crackles Cardiovascular system: S1 & S2 heard, RRR. No JVD, murmurs, rubs, gallops or clicks. No pedal edema. Gastrointestinal system: Abdomen is nondistended, soft and nontender. No organomegaly or masses felt. Normal bowel sounds heard. Central nervous system: Alert and oriented. Extremities: No edema, no clubbing ,no cyanosis Skin: No rashes, lesions or ulcers,no icterus ,no pallor   Data Reviewed: I have personally reviewed following labs and imaging studies  CBC: Recent Labs  Lab 05/20/20 1657 05/22/20 0540 05/23/20 0506 05/24/20 0521  WBC 7.7 8.6 7.9 7.7  NEUTROABS 6.9 6.8 5.0 5.2  HGB 14.7 14.3 15.3* 14.6  HCT 44.5 43.3 47.4* 45.1  MCV 92.7 93.1 92.8 93.0  PLT 430* 409* 412* 086   Basic Metabolic Panel: Recent Labs  Lab  05/20/20 1657 05/20/20 1855 05/21/20 0919 05/22/20 0540 05/23/20 0506 05/24/20 0521  NA 141  --  138 136 130* 134*  K 4.6  --  4.1 4.2 3.7 3.8  CL 103  --  100 98 91* 98  CO2 26  --  28 28 28 28   GLUCOSE 123*  --  156* 141* 96 96  BUN 16  --  15 27* 21 17  CREATININE 0.80  --  0.74 0.83 0.87 0.76  CALCIUM 9.4  --  9.5 9.2 8.9 8.8*  MG  --  2.1 2.1 2.4 2.2 2.1  PHOS  --   --  3.6  --   --   --    GFR: Estimated Creatinine Clearance: 56.9 mL/min (by C-G formula based on SCr of 0.76 mg/dL). Liver Function Tests: Recent Labs  Lab 05/21/20 0919  AST 29  ALT 32  ALKPHOS 72  BILITOT 0.3  PROT 8.0  ALBUMIN 4.2   No results for input(s): LIPASE, AMYLASE in the last 168 hours. No results for input(s): AMMONIA in the last 168 hours. Coagulation Profile: No results for input(s): INR, PROTIME in the last 168 hours. Cardiac Enzymes: No results for input(s): CKTOTAL, CKMB, CKMBINDEX, TROPONINI in the last 168 hours. BNP (last 3 results) No results for input(s): PROBNP in the last 8760 hours. HbA1C: No results for input(s): HGBA1C in the last 72 hours. CBG: No results for input(s): GLUCAP in the last 168 hours. Lipid Profile: No results for input(s): CHOL, HDL, LDLCALC, TRIG, CHOLHDL, LDLDIRECT in the last 72 hours. Thyroid Function Tests: No results for input(s): TSH, T4TOTAL, FREET4, T3FREE, THYROIDAB in the last 72 hours. Anemia Panel: No results for input(s): VITAMINB12, FOLATE, FERRITIN, TIBC, IRON, RETICCTPCT in the last 72 hours. Sepsis Labs: No results for input(s): PROCALCITON, LATICACIDVEN in the last 168 hours.  Recent Results (from the past 240 hour(s))  SARS Coronavirus 2 by RT PCR (hospital order, performed in [facility] hospital lab) Nasopharyngeal Nasopharyngeal Swab     Status: None   Collection Time: 05/20/20  4:57 PM   Specimen: Nasopharyngeal Swab  Result Value Ref Range Status   SARS Coronavirus 2 NEGATIVE NEGATIVE Final    Comment: (NOTE) SARS-CoV-2  target nucleic acids are NOT DETECTED.  The SARS-CoV-2 RNA is generally detectable in upper and lower respiratory specimens during the acute phase of infection. The lowest concentration of SARS-CoV-2 viral copies this assay can detect is 250 copies / mL. A negative result does not preclude SARS-CoV-2 infection and should not be used as the sole basis for treatment or other patient management decisions.  A negative result may occur with improper specimen collection / handling, submission of specimen other than nasopharyngeal swab, presence of viral mutation(s) within the areas targeted by this assay, and inadequate number of viral copies (<250 copies / mL). A negative result must be combined with clinical observations, patient history, and epidemiological information.  Fact Sheet for Patients:   StrictlyIdeas.no  Fact Sheet for Healthcare Providers: BankingDealers.co.za  This test is not yet approved or  cleared by the [place] FDA and has been authorized for detection and/or diagnosis of SARS-CoV-2 by FDA under an Emergency Use Authorization (EUA).  This EUA will remain in effect (meaning this test can be used) for the duration of the COVID-19 declaration under Section 564(b)(1) of the Act, 21 U.S.C. section 360bbb-3(b)(1), unless the authorization is terminated or revoked sooner.  Performed at [facility], [place] [address] [place], [place] [address]   Respiratory Panel by PCR     Status: None   Collection Time: 05/21/20  7:27 AM   Specimen: Nasopharyngeal Swab; Respiratory  Result Value Ref Range Status   Adenovirus NOT DETECTED NOT DETECTED Final   Coronavirus 229E NOT DETECTED NOT DETECTED Final    Comment: (NOTE) The Coronavirus on the Respiratory Panel, DOES NOT test for the novel  Coronavirus (2019 nCoV)    Coronavirus HKU1 NOT DETECTED NOT DETECTED Final   Coronavirus NL63 NOT DETECTED NOT DETECTED  Final   Coronavirus OC43 NOT DETECTED NOT DETECTED Final   Metapneumovirus NOT DETECTED NOT DETECTED Final   Rhinovirus / Enterovirus NOT DETECTED NOT DETECTED Final   Influenza A NOT DETECTED NOT DETECTED Final   Influenza B NOT DETECTED NOT DETECTED Final   Parainfluenza Virus 1 NOT DETECTED NOT DETECTED Final   Parainfluenza Virus 2 NOT DETECTED NOT  DETECTED Final   Parainfluenza Virus 3 NOT DETECTED NOT DETECTED Final   Parainfluenza Virus 4 NOT DETECTED NOT DETECTED Final   Respiratory Syncytial Virus NOT DETECTED NOT DETECTED Final   Bordetella pertussis NOT DETECTED NOT DETECTED Final   Chlamydophila pneumoniae NOT DETECTED NOT DETECTED Final   Mycoplasma pneumoniae NOT DETECTED NOT DETECTED Final    Comment: Performed at [facility], [place] [address], [place], [place] [address]         Radiology Studies: No results found.      Scheduled Meds: . aspirin EC  81 mg Oral Daily  . atorvastatin  80 mg Oral Daily  . cilostazol  100 mg Oral BID  . dextromethorphan-guaiFENesin  1 tablet Oral BID  . docusate sodium  100 mg Oral BID  . enoxaparin (LOVENOX) injection  40 mg Subcutaneous Q24H  . feeding supplement (ENSURE ENLIVE)  237 mL Oral Q24H  . ipratropium-albuterol  3 mL Nebulization Q6H  . levofloxacin  750 mg Oral Daily  . levothyroxine  112 mcg Oral Q0600  . LORazepam  2 mg Oral QHS  . mometasone-formoterol  1 puff Inhalation BID  . montelukast  10 mg Oral QHS  . pantoprazole  40 mg Oral Daily  . PARoxetine  20 mg Oral QHS  . predniSONE  40 mg Oral Q breakfast  . sodium chloride flush  3 mL Intravenous Q12H  . sodium chloride flush  3 mL Intravenous Q12H   Continuous Infusions:   LOS: 4 days    Time spent: 35 mins.More than 50% of that time was spent in counseling and/or coordination of care.      Shelly Coss, MD [facility] P8/07/2020, 10:36 AM

## 2020-05-24 NOTE — Consult Note (Addendum)
Progress Note  Patient Name: [name] Date of Encounter: 05/24/2020  [facility] Cardiologist: Fransico Him, MD   Subjective   No chest pain,  Her SOB is episodic, but she was pretty flat for 30 min last night.   desat to 83% on RA when up in room.  Inpatient Medications    Scheduled Meds: . aspirin EC  81 mg Oral Daily  . atorvastatin  80 mg Oral Daily  . cilostazol  100 mg Oral BID  . dextromethorphan-guaiFENesin  2 tablet Oral BID  . docusate sodium  100 mg Oral BID  . enoxaparin (LOVENOX) injection  40 mg Subcutaneous Q24H  . feeding supplement (ENSURE ENLIVE)  237 mL Oral Q24H  . ipratropium-albuterol  3 mL Nebulization Q6H  . levofloxacin  750 mg Oral Daily  . levothyroxine  112 mcg Oral Q0600  . LORazepam  2 mg Oral QHS  . mometasone-formoterol  1 puff Inhalation BID  . montelukast  10 mg Oral QHS  . pantoprazole  40 mg Oral Daily  . PARoxetine  20 mg Oral QHS  . predniSONE  40 mg Oral Q breakfast  . sodium chloride flush  3 mL Intravenous Q12H  . sodium chloride flush  3 mL Intravenous Q12H   Continuous Infusions:  PRN Meds: acetaminophen **OR** acetaminophen, albuterol, fluticasone, HYDROcodone-acetaminophen, ipratropium-albuterol, ondansetron **OR** ondansetron (ZOFRAN) IV   Vital Signs    Vitals:   05/24/20 0521 05/24/20 0600 05/24/20 0700 05/24/20 0827  BP: 110/83     Pulse: 82     Resp:  14 17   Temp: 98.3 F (36.8 C)     TempSrc: Oral     SpO2: 92%   (!) 83%  Weight:      Height:        Intake/Output Summary (Last 24 hours) at 05/24/2020 1141 Last data filed at 05/24/2020 0522 Gross per 24 hour  Intake 1500 ml  Output -  Net 1500 ml   Last 3 Weights 05/20/2020 04/25/2020 04/06/2020  Weight (lbs) 134 lb 7.7 oz 139 lb 6.4 oz 134 lb  Weight (kg) 61 kg 63.231 kg 60.782 kg      Telemetry    SR to ST - Personally Reviewed  ECG    No new - Personally Reviewed  Physical Exam   GEN: No acute distress.   Neck: No JVD in chair  Cardiac: RRR, no murmurs, rubs, or gallops.  Respiratory: no rales, occ wheezes and with deep breath coughs.  Some rhonchi.to auscultation bilaterally. GI: Soft, nontender, non-distended  MS: No edema; No deformity. Neuro:  Nonfocal  Psych: Normal affect   Labs    High Sensitivity Troponin:   Recent Labs  Lab 05/20/20 1657 05/20/20 1855 05/23/20 1100  TROPONINIHS 15 12 19*      Chemistry Recent Labs  Lab 05/21/20 0919 05/21/20 0919 05/22/20 0540 05/23/20 0506 05/24/20 0521  NA 138   < > 136 130* 134*  K 4.1   < > 4.2 3.7 3.8  CL 100   < > 98 91* 98  CO2 28   < > 28 28 28   GLUCOSE 156*   < > 141* 96 96  BUN 15   < > 27* 21 17  CREATININE 0.74   < > 0.83 0.87 0.76  CALCIUM 9.5   < > 9.2 8.9 8.8*  PROT 8.0  --   --   --   --   ALBUMIN 4.2  --   --   --   --  AST 29  --   --   --   --   ALT 32  --   --   --   --   ALKPHOS 72  --   --   --   --   BILITOT 0.3  --   --   --   --   GFRNONAA >60   < > >60 >60 >60  GFRAA >60   < > >60 >60 >60  ANIONGAP 10   < > 10 11 8    < > = values in this interval not displayed.     Hematology Recent Labs  Lab 05/22/20 0540 05/23/20 0506 05/24/20 0521  WBC 8.6 7.9 7.7  RBC 4.65 5.11 4.85  HGB 14.3 15.3* 14.6  HCT 43.3 47.4* 45.1  MCV 93.1 92.8 93.0  MCH 30.8 29.9 30.1  MCHC 33.0 32.3 32.4  RDW 14.2 13.8 13.5  PLT 409* 412* 389    BNP Recent Labs  Lab 05/20/20 1657  BNP 99.7     DDimer No results for input(s): DDIMER in the last 168 hours.   Radiology    No results found.  Cardiac Studies   Echo 05/21/20 IMPRESSIONS    1. Left ventricular ejection fraction, by estimation, is 45 to 50%. The  left ventricle has mildly decreased function. The left ventricle  demonstrates global hypokinesis. Indeterminate diastolic filling due to  E-A fusion. Definity contrast administered  but images remain suboptimal to exclude regional wall motion  abnormalities.  2. Right ventricular systolic function is normal. The  right ventricular  size is normal. Tricuspid regurgitation signal is inadequate for assessing  PA pressure.  3. Left atrial size was mildly dilated.  4. The mitral valve is degenerative. Trivial mitral valve regurgitation.  Mild mitral stenosis. The mean mitral valve gradient is 3.0 mmHg with  average heart rate of 85 bpm.  5. The aortic valve is grossly normal. Aortic valve regurgitation is not  visualized. No aortic stenosis is present.  6. The inferior vena cava is dilated in size with >50% respiratory  variability, suggesting right atrial pressure of 8 mmHg.   Comparison(s): A prior study was performed on 08/03/2019. Prior images  reviewed side by side. LVEF compared to prior appears reduced.   FINDINGS  Left Ventricle: Left ventricular ejection fraction, by estimation, is 45  to 50%. The left ventricle has mildly decreased function. The left  ventricle demonstrates global hypokinesis. Definity contrast agent was  given IV to delineate the left ventricular  endocardial borders. The left ventricular internal cavity size was normal  in size. There is no left ventricular hypertrophy. Indeterminate diastolic  filling due to E-A fusion.   Right Ventricle: The right ventricular size is normal. Right vetricular  wall thickness was not assessed. Right ventricular systolic function is  normal. Tricuspid regurgitation signal is inadequate for assessing PA  pressure.   Left Atrium: Left atrial size was mildly dilated.   Right Atrium: Right atrial size was normal in size.   Pericardium: There is no evidence of pericardial effusion.   Mitral Valve: The mitral valve is degenerative in appearance. Normal  mobility of the mitral valve leaflets. Mild to moderate mitral annular  calcification. Trivial mitral valve regurgitation. Mild mitral valve  stenosis. The mean mitral valve gradient is  3.0 mmHg with average heart rate of 85 bpm.   Tricuspid Valve: The tricuspid valve is normal  in structure. Tricuspid  valve regurgitation is trivial. No evidence of tricuspid stenosis.  Aortic Valve: The aortic valve is grossly normal. Aortic valve  regurgitation is not visualized. No aortic stenosis is present.   Pulmonic Valve: The pulmonic valve was normal in structure. Pulmonic valve  regurgitation is not visualized. No evidence of pulmonic stenosis.   Aorta: The aortic root is normal in size and structure.   Venous: The inferior vena cava is dilated in size with greater than 50%  respiratory variability, suggesting right atrial pressure of 8 mmHg.   IAS/Shunts: No atrial level shunt detected by color flow Doppler.      Patient Profile     74 y.o. female with a hx of HTN, COPD, breast cancer, HLD, PAD (Dr. 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   Assessment & Plan    1.  COPD exacerbation with hypoxia and mild CHF.  Treated HF with 2 doses of Lasix.    2.  DOE with known CAD and new EKG changes, troponin negative. + CAD on chest CTA.  Concern for increase of her CAD playing a role with her dyspnea in combination of COPD.  Cardiac cath would be beneficial to evaluate.   Will discuss timing Dr. Margaretann Loveless to see.  -- either Wed or Thursday this week she would prefer while here.  May be tomorrow.  3.  CAD last cath 2012 with mild disease.  4.  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 Followed by Dr. Donzetta Matters.  Stable though episodes of diarrhea.   5.  Hx tobacco use, recurrent for a month or so in Jan 2021 but no longer using tobacco.    6.  HLD has been on lipitor continue       For questions or updates, please contact [place] Please consult [email or web address] for contact info under        Signed, Cecilie Kicks, NP  05/24/2020, 11:41 AM    Patient seen and examined with Cecilie Kicks NP.  Agree as above, with the following exceptions and changes as noted below. We discussed indication for cardiac catheterization given DOE with CAD on CTPE and ECG changes. Patient is eager to accomplish this as soon as possible. Gen: NAD, CV: RRR, no murmurs, Lungs: coarse throughout, mild increased work of breathing, Abd: soft, Extrem: Warm, thin, no edema, Neuro/Psych: alert and oriented x 3, normal mood and affect. All available labs, radiology testing, previous records reviewed. Will plan for ischemic evaluation with coronary angiogram while in hospital per patient preference, given drop in EF, albeit mild.   INFORMED CONSENT: I have reviewed the risks, indications, and alternatives to cardiac catheterization, possible angioplasty, and stenting with the patient. Risks include but are not limited to bleeding, infection, vascular injury, stroke, myocardial infection, arrhythmia, kidney injury, radiation-related injury in the case of prolonged fluoroscopy use, emergency cardiac surgery, and death. The patient understands the risks of serious complication is 1-2 in 8937 with diagnostic cardiac cath and 1-2% or less with angioplasty/stenting.    Elouise Munroe, MD 05/24/20 5:42 PM

## 2020-05-24 NOTE — H&P (View-Only) (Signed)
Progress Note  Patient Name: [name] Date of Encounter: 05/24/2020  [facility] Cardiologist: Fransico Him, MD   Subjective   No chest pain,  Her SOB is episodic, but she was pretty flat for 30 min last night.   desat to 83% on RA when up in room.  Inpatient Medications    Scheduled Meds: . aspirin EC  81 mg Oral Daily  . atorvastatin  80 mg Oral Daily  . cilostazol  100 mg Oral BID  . dextromethorphan-guaiFENesin  2 tablet Oral BID  . docusate sodium  100 mg Oral BID  . enoxaparin (LOVENOX) injection  40 mg Subcutaneous Q24H  . feeding supplement (ENSURE ENLIVE)  237 mL Oral Q24H  . ipratropium-albuterol  3 mL Nebulization Q6H  . levofloxacin  750 mg Oral Daily  . levothyroxine  112 mcg Oral Q0600  . LORazepam  2 mg Oral QHS  . mometasone-formoterol  1 puff Inhalation BID  . montelukast  10 mg Oral QHS  . pantoprazole  40 mg Oral Daily  . PARoxetine  20 mg Oral QHS  . predniSONE  40 mg Oral Q breakfast  . sodium chloride flush  3 mL Intravenous Q12H  . sodium chloride flush  3 mL Intravenous Q12H   Continuous Infusions:  PRN Meds: acetaminophen **OR** acetaminophen, albuterol, fluticasone, HYDROcodone-acetaminophen, ipratropium-albuterol, ondansetron **OR** ondansetron (ZOFRAN) IV   Vital Signs    Vitals:   05/24/20 0521 05/24/20 0600 05/24/20 0700 05/24/20 0827  BP: 110/83     Pulse: 82     Resp:  14 17   Temp: 98.3 F (36.8 C)     TempSrc: Oral     SpO2: 92%   (!) 83%  Weight:      Height:        Intake/Output Summary (Last 24 hours) at 05/24/2020 1141 Last data filed at 05/24/2020 0522 Gross per 24 hour  Intake 1500 ml  Output --  Net 1500 ml   Last 3 Weights 05/20/2020 04/25/2020 04/06/2020  Weight (lbs) 134 lb 7.7 oz 139 lb 6.4 oz 134 lb  Weight (kg) 61 kg 63.231 kg 60.782 kg      Telemetry    SR to ST - Personally Reviewed  ECG    No new - Personally Reviewed  Physical Exam   GEN: No acute distress.   Neck: No JVD in  chair Cardiac: RRR, no murmurs, rubs, or gallops.  Respiratory: no rales, occ wheezes and with deep breath coughs.  Some rhonchi.to auscultation bilaterally. GI: Soft, nontender, non-distended  MS: No edema; No deformity. Neuro:  Nonfocal  Psych: Normal affect   Labs    High Sensitivity Troponin:   Recent Labs  Lab 05/20/20 1657 05/20/20 1855 05/23/20 1100  TROPONINIHS 15 12 19*      Chemistry Recent Labs  Lab 05/21/20 0919 05/21/20 0919 05/22/20 0540 05/23/20 0506 05/24/20 0521  NA 138   < > 136 130* 134*  K 4.1   < > 4.2 3.7 3.8  CL 100   < > 98 91* 98  CO2 28   < > 28 28 28   GLUCOSE 156*   < > 141* 96 96  BUN 15   < > 27* 21 17  CREATININE 0.74   < > 0.83 0.87 0.76  CALCIUM 9.5   < > 9.2 8.9 8.8*  PROT 8.0  --   --   --   --   ALBUMIN 4.2  --   --   --   --  AST 29  --   --   --   --   ALT 32  --   --   --   --   ALKPHOS 72  --   --   --   --   BILITOT 0.3  --   --   --   --   GFRNONAA >60   < > >60 >60 >60  GFRAA >60   < > >60 >60 >60  ANIONGAP 10   < > 10 11 8    < > = values in this interval not displayed.     Hematology Recent Labs  Lab 05/22/20 0540 05/23/20 0506 05/24/20 0521  WBC 8.6 7.9 7.7  RBC 4.65 5.11 4.85  HGB 14.3 15.3* 14.6  HCT 43.3 47.4* 45.1  MCV 93.1 92.8 93.0  MCH 30.8 29.9 30.1  MCHC 33.0 32.3 32.4  RDW 14.2 13.8 13.5  PLT 409* 412* 389    BNP Recent Labs  Lab 05/20/20 1657  BNP 99.7     DDimer No results for input(s): DDIMER in the last 168 hours.   Radiology    No results found.  Cardiac Studies   Echo 05/21/20 IMPRESSIONS    1. Left ventricular ejection fraction, by estimation, is 45 to 50%. The  left ventricle has mildly decreased function. The left ventricle  demonstrates global hypokinesis. Indeterminate diastolic filling due to  E-A fusion. Definity contrast administered  but images remain suboptimal to exclude regional wall motion  abnormalities.  2. Right ventricular systolic function is normal.  The right ventricular  size is normal. Tricuspid regurgitation signal is inadequate for assessing  PA pressure.  3. Left atrial size was mildly dilated.  4. The mitral valve is degenerative. Trivial mitral valve regurgitation.  Mild mitral stenosis. The mean mitral valve gradient is 3.0 mmHg with  average heart rate of 85 bpm.  5. The aortic valve is grossly normal. Aortic valve regurgitation is not  visualized. No aortic stenosis is present.  6. The inferior vena cava is dilated in size with >50% respiratory  variability, suggesting right atrial pressure of 8 mmHg.   Comparison(s): A prior study was performed on 08/03/2019. Prior images  reviewed side by side. LVEF compared to prior appears reduced.   FINDINGS  Left Ventricle: Left ventricular ejection fraction, by estimation, is 45  to 50%. The left ventricle has mildly decreased function. The left  ventricle demonstrates global hypokinesis. Definity contrast agent was  given IV to delineate the left ventricular  endocardial borders. The left ventricular internal cavity size was normal  in size. There is no left ventricular hypertrophy. Indeterminate diastolic  filling due to E-A fusion.   Right Ventricle: The right ventricular size is normal. Right vetricular  wall thickness was not assessed. Right ventricular systolic function is  normal. Tricuspid regurgitation signal is inadequate for assessing PA  pressure.   Left Atrium: Left atrial size was mildly dilated.   Right Atrium: Right atrial size was normal in size.   Pericardium: There is no evidence of pericardial effusion.   Mitral Valve: The mitral valve is degenerative in appearance. Normal  mobility of the mitral valve leaflets. Mild to moderate mitral annular  calcification. Trivial mitral valve regurgitation. Mild mitral valve  stenosis. The mean mitral valve gradient is  3.0 mmHg with average heart rate of 85 bpm.   Tricuspid Valve: The tricuspid valve is  normal in structure. Tricuspid  valve regurgitation is trivial. No evidence of tricuspid stenosis.  Aortic Valve: The aortic valve is grossly normal. Aortic valve  regurgitation is not visualized. No aortic stenosis is present.   Pulmonic Valve: The pulmonic valve was normal in structure. Pulmonic valve  regurgitation is not visualized. No evidence of pulmonic stenosis.   Aorta: The aortic root is normal in size and structure.   Venous: The inferior vena cava is dilated in size with greater than 50%  respiratory variability, suggesting right atrial pressure of 8 mmHg.   IAS/Shunts: No atrial level shunt detected by color flow Doppler.      Patient Profile     74 y.o. female with a hx of HTN, COPD, breast cancer, HLD, PAD (Dr. 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   Assessment & Plan    1.  COPD exacerbation with hypoxia and mild CHF.  Treated HF with 2 doses of Lasix.    2.  DOE with known CAD and new EKG changes, troponin negative. + CAD on chest CTA.  Concern for increase of her CAD playing a role with her dyspnea in combination of COPD.  Cardiac cath would be beneficial to evaluate.   Will discuss timing Dr. Margaretann Loveless to see.  -- either Wed or Thursday this week she would prefer while here.  May be tomorrow.  3.  CAD last cath 2012 with mild disease.  4.  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 Followed by Dr. Donzetta Matters.  Stable though episodes of diarrhea.   5.  Hx tobacco use, recurrent for a month or so in Jan 2021 but no longer using tobacco.    6.  HLD has been on lipitor continue       For questions or updates, please contact [place] Please consult [email or web address] for contact info under        Signed, Cecilie Kicks, NP  05/24/2020, 11:41 AM    Patient seen and examined with  Cecilie Kicks NP.  Agree as above, with the following exceptions and changes as noted below. We discussed indication for cardiac catheterization given DOE with CAD on CTPE and ECG changes. Patient is eager to accomplish this as soon as possible. Gen: NAD, CV: RRR, no murmurs, Lungs: coarse throughout, mild increased work of breathing, Abd: soft, Extrem: Warm, thin, no edema, Neuro/Psych: alert and oriented x 3, normal mood and affect. All available labs, radiology testing, previous records reviewed. Will plan for ischemic evaluation with coronary angiogram while in hospital per patient preference, given drop in EF, albeit mild.   INFORMED CONSENT: I have reviewed the risks, indications, and alternatives to cardiac catheterization, possible angioplasty, and stenting with the patient. Risks include but are not limited to bleeding, infection, vascular injury, stroke, myocardial infection, arrhythmia, kidney injury, radiation-related injury in the case of prolonged fluoroscopy use, emergency cardiac surgery, and death. The patient understands the risks of serious complication is 1-2 in 1572 with diagnostic cardiac cath and 1-2% or less with angioplasty/stenting.    Elouise Munroe, MD 05/24/20 5:42 PM

## 2020-05-24 NOTE — Consult Note (Addendum)
Progress Note  Patient Name: [name] Date of Encounter: 05/24/2020  [facility] Cardiologist: Fransico Him, MD   Subjective   No chest pain,  Her SOB is episodic, but she was pretty flat for 30 min last night.   desat to 83% on RA when up in room.  Inpatient Medications    Scheduled Meds: . aspirin EC  81 mg Oral Daily  . atorvastatin  80 mg Oral Daily  . cilostazol  100 mg Oral BID  . dextromethorphan-guaiFENesin  2 tablet Oral BID  . docusate sodium  100 mg Oral BID  . enoxaparin (LOVENOX) injection  40 mg Subcutaneous Q24H  . feeding supplement (ENSURE ENLIVE)  237 mL Oral Q24H  . ipratropium-albuterol  3 mL Nebulization Q6H  . levofloxacin  750 mg Oral Daily  . levothyroxine  112 mcg Oral Q0600  . LORazepam  2 mg Oral QHS  . mometasone-formoterol  1 puff Inhalation BID  . montelukast  10 mg Oral QHS  . pantoprazole  40 mg Oral Daily  . PARoxetine  20 mg Oral QHS  . predniSONE  40 mg Oral Q breakfast  . sodium chloride flush  3 mL Intravenous Q12H  . sodium chloride flush  3 mL Intravenous Q12H   Continuous Infusions:  PRN Meds: acetaminophen **OR** acetaminophen, albuterol, fluticasone, HYDROcodone-acetaminophen, ipratropium-albuterol, ondansetron **OR** ondansetron (ZOFRAN) IV   Vital Signs    Vitals:   05/24/20 0521 05/24/20 0600 05/24/20 0700 05/24/20 0827  BP: 110/83     Pulse: 82     Resp:  14 17   Temp: 98.3 F (36.8 C)     TempSrc: Oral     SpO2: 92%   (!) 83%  Weight:      Height:        Intake/Output Summary (Last 24 hours) at 05/24/2020 1141 Last data filed at 05/24/2020 0522 Gross per 24 hour  Intake 1500 ml  Output --  Net 1500 ml   Last 3 Weights 05/20/2020 04/25/2020 04/06/2020  Weight (lbs) 134 lb 7.7 oz 139 lb 6.4 oz 134 lb  Weight (kg) 61 kg 63.231 kg 60.782 kg      Telemetry    SR to ST - Personally Reviewed  ECG    No new - Personally Reviewed  Physical Exam   GEN: No acute distress.   Neck: No JVD in  chair Cardiac: RRR, no murmurs, rubs, or gallops.  Respiratory: no rales, occ wheezes and with deep breath coughs.  Some rhonchi.to auscultation bilaterally. GI: Soft, nontender, non-distended  MS: No edema; No deformity. Neuro:  Nonfocal  Psych: Normal affect   Labs    High Sensitivity Troponin:   Recent Labs  Lab 05/20/20 1657 05/20/20 1855 05/23/20 1100  TROPONINIHS 15 12 19*      Chemistry Recent Labs  Lab 05/21/20 0919 05/21/20 0919 05/22/20 0540 05/23/20 0506 05/24/20 0521  NA 138   < > 136 130* 134*  K 4.1   < > 4.2 3.7 3.8  CL 100   < > 98 91* 98  CO2 28   < > 28 28 28   GLUCOSE 156*   < > 141* 96 96  BUN 15   < > 27* 21 17  CREATININE 0.74   < > 0.83 0.87 0.76  CALCIUM 9.5   < > 9.2 8.9 8.8*  PROT 8.0  --   --   --   --   ALBUMIN 4.2  --   --   --   --  AST 29  --   --   --   --   ALT 32  --   --   --   --   ALKPHOS 72  --   --   --   --   BILITOT 0.3  --   --   --   --   GFRNONAA >60   < > >60 >60 >60  GFRAA >60   < > >60 >60 >60  ANIONGAP 10   < > 10 11 8    < > = values in this interval not displayed.     Hematology Recent Labs  Lab 05/22/20 0540 05/23/20 0506 05/24/20 0521  WBC 8.6 7.9 7.7  RBC 4.65 5.11 4.85  HGB 14.3 15.3* 14.6  HCT 43.3 47.4* 45.1  MCV 93.1 92.8 93.0  MCH 30.8 29.9 30.1  MCHC 33.0 32.3 32.4  RDW 14.2 13.8 13.5  PLT 409* 412* 389    BNP Recent Labs  Lab 05/20/20 1657  BNP 99.7     DDimer No results for input(s): DDIMER in the last 168 hours.   Radiology    No results found.  Cardiac Studies   Echo 05/21/20 IMPRESSIONS    1. Left ventricular ejection fraction, by estimation, is 45 to 50%. The  left ventricle has mildly decreased function. The left ventricle  demonstrates global hypokinesis. Indeterminate diastolic filling due to  E-A fusion. Definity contrast administered  but images remain suboptimal to exclude regional wall motion  abnormalities.  2. Right ventricular systolic function is normal.  The right ventricular  size is normal. Tricuspid regurgitation signal is inadequate for assessing  PA pressure.  3. Left atrial size was mildly dilated.  4. The mitral valve is degenerative. Trivial mitral valve regurgitation.  Mild mitral stenosis. The mean mitral valve gradient is 3.0 mmHg with  average heart rate of 85 bpm.  5. The aortic valve is grossly normal. Aortic valve regurgitation is not  visualized. No aortic stenosis is present.  6. The inferior vena cava is dilated in size with >50% respiratory  variability, suggesting right atrial pressure of 8 mmHg.   Comparison(s): A prior study was performed on 08/03/2019. Prior images  reviewed side by side. LVEF compared to prior appears reduced.   FINDINGS  Left Ventricle: Left ventricular ejection fraction, by estimation, is 45  to 50%. The left ventricle has mildly decreased function. The left  ventricle demonstrates global hypokinesis. Definity contrast agent was  given IV to delineate the left ventricular  endocardial borders. The left ventricular internal cavity size was normal  in size. There is no left ventricular hypertrophy. Indeterminate diastolic  filling due to E-A fusion.   Right Ventricle: The right ventricular size is normal. Right vetricular  wall thickness was not assessed. Right ventricular systolic function is  normal. Tricuspid regurgitation signal is inadequate for assessing PA  pressure.   Left Atrium: Left atrial size was mildly dilated.   Right Atrium: Right atrial size was normal in size.   Pericardium: There is no evidence of pericardial effusion.   Mitral Valve: The mitral valve is degenerative in appearance. Normal  mobility of the mitral valve leaflets. Mild to moderate mitral annular  calcification. Trivial mitral valve regurgitation. Mild mitral valve  stenosis. The mean mitral valve gradient is  3.0 mmHg with average heart rate of 85 bpm.   Tricuspid Valve: The tricuspid valve is  normal in structure. Tricuspid  valve regurgitation is trivial. No evidence of tricuspid stenosis.  Aortic Valve: The aortic valve is grossly normal. Aortic valve  regurgitation is not visualized. No aortic stenosis is present.   Pulmonic Valve: The pulmonic valve was normal in structure. Pulmonic valve  regurgitation is not visualized. No evidence of pulmonic stenosis.   Aorta: The aortic root is normal in size and structure.   Venous: The inferior vena cava is dilated in size with greater than 50%  respiratory variability, suggesting right atrial pressure of 8 mmHg.   IAS/Shunts: No atrial level shunt detected by color flow Doppler.      Patient Profile     74 y.o. female with a hx of HTN, COPD, breast cancer, HLD, PAD (Dr. 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   Assessment & Plan    1.  COPD exacerbation with hypoxia and mild CHF.  Treated HF with 2 doses of Lasix.    2.  DOE with known CAD and new EKG changes, troponin negative. + CAD on chest CTA.  Concern for increase of her CAD playing a role with her dyspnea in combination of COPD.  Cardiac cath would be beneficial to evaluate.   Will discuss timing Dr. Margaretann Loveless to see.  -- either Wed or Thursday this week she would prefer while here.  May be tomorrow.  3.  CAD last cath 2012 with mild disease.  4.  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 Followed by Dr. Donzetta Matters.  Stable though episodes of diarrhea.   5.  Hx tobacco use, recurrent for a month or so in Jan 2021 but no longer using tobacco.    6.  HLD has been on lipitor continue       For questions or updates, please contact [place] Please consult [email or web address] for contact info under        Signed, Cecilie Kicks, NP  05/24/2020, 11:41 AM    Patient seen and examined with  Cecilie Kicks NP.  Agree as above, with the following exceptions and changes as noted below. We discussed indication for cardiac catheterization given DOE with CAD on CTPE and ECG changes. Patient is eager to accomplish this as soon as possible. Gen: NAD, CV: RRR, no murmurs, Lungs: coarse throughout, mild increased work of breathing, Abd: soft, Extrem: Warm, thin, no edema, Neuro/Psych: alert and oriented x 3, normal mood and affect. All available labs, radiology testing, previous records reviewed. Will plan for ischemic evaluation with coronary angiogram while in hospital per patient preference, given drop in EF, albeit mild.   INFORMED CONSENT: I have reviewed the risks, indications, and alternatives to cardiac catheterization, possible angioplasty, and stenting with the patient. Risks include but are not limited to bleeding, infection, vascular injury, stroke, myocardial infection, arrhythmia, kidney injury, radiation-related injury in the case of prolonged fluoroscopy use, emergency cardiac surgery, and death. The patient understands the risks of serious complication is 1-2 in 2094 with diagnostic cardiac cath and 1-2% or less with angioplasty/stenting.    Elouise Munroe, MD 05/24/20 5:42 PM

## 2020-05-24 NOTE — Progress Notes (Signed)
Physical Therapy Treatment Patient Details Name: [name] MRN: [id number] DOB: 11/16/1945 Today's Date: 05/24/2020    History of Present Illness [name] is a 74 year old Caucasian female with PMH COPD (on 2 L chronic nasal cannula at home), ongoing grief/mourning due to son passing away July 23, 2019, hard of hearing, PVD, hypothyroidism, hypertension, hyperlipidemia, diverticulosis, CAD who presented to the hospital with worsening dyspnea at home.  She states that she has had multiple episodes over the past year of severe shortness of breath and has been on multiple rounds of steroids and antibiotics.  She associates much of her dyspnea due to stress episodes centered around grieving after her son passed away last Jul 23, 2023.  Pt admitted for COPD exacerbation with hypoxia and mild CHF    PT Comments    Pt ambulated in hallway and required standing rest break for breathing.  Pt also performed a few exercises in sitting.  Pt encouraged to perform breathing and take rest breaks as needed.    Follow Up Recommendations  Home health PT     Equipment Recommendations  None recommended by PT    Recommendations for Other Services       Precautions / Restrictions Precautions Precautions: Fall Precaution Comments: monitor sats, 2L O2 baseline Restrictions Weight Bearing Restrictions: No    Mobility  Bed Mobility               General bed mobility comments: pt OOB in recliner  Transfers Overall transfer level: Needs assistance Equipment used: Rolling walker (2 wheeled) Transfers: Sit to/from Stand Sit to Stand: Supervision         General transfer comment: Pt performed 5 reps x 2 of sit <> stands without use of UEs, from recliner without AD with pulse ox on for biofeedback to help pt determine when rest breaks are indicated.  Transfers with supervision for safety only.  Pt stood on her own and arrnaged linens on her chair with  supervision.  Ambulation/Gait Ambulation/Gait assistance: Min guard Gait Distance (Feet): 50 Feet (x2) Assistive device: None Gait Pattern/deviations: Step-through pattern;Decreased stride length;Trunk flexed     General Gait Details: min/guard for safety, cues for pursed lip breathing, standing rest break required, SPO2 86% on 2L O2 [place], improved to 90% with breathing   Stairs             Wheelchair Mobility    Modified Rankin (Stroke Patients Only)       Balance Overall balance assessment: Modified Independent                                          Cognition Arousal/Alertness: Awake/alert Behavior During Therapy: WFL for tasks assessed/performed Overall Cognitive Status: Within Functional Limits for tasks assessed                                 General Comments: Very pleasant      Exercises General Exercises - Lower Extremity Ankle Circles/Pumps: AROM;Both;5 reps;Seated Long Arc Quad: AROM;Both;10 reps;Seated Hip Flexion/Marching: AROM;Seated;Both;10 reps    General Comments        Pertinent Vitals/Pain Pain Assessment: No/denies pain    Home Living                      Prior Function  PT Goals (current goals can now be found in the care plan section) Acute Rehab PT Goals Patient Stated Goal: home Progress towards PT goals: Progressing toward goals    Frequency    Min 3X/week      PT Plan Current plan remains appropriate    Co-evaluation              AM-PAC PT "6 Clicks" Mobility   Outcome Measure  Help needed turning from your back to your side while in a flat bed without using bedrails?: A Little Help needed moving from lying on your back to sitting on the side of a flat bed without using bedrails?: A Little Help needed moving to and from a bed to a chair (including a wheelchair)?: A Little Help needed standing up from a chair using your arms (e.g., wheelchair or bedside  chair)?: A Little Help needed to walk in hospital room?: A Little Help needed climbing 3-5 steps with a railing? : A Lot 6 Click Score: 17    End of Session Equipment Utilized During Treatment: Gait belt Activity Tolerance: Patient limited by fatigue Patient left: in chair;with call bell/phone within reach;with chair alarm set   PT Visit Diagnosis: Difficulty in walking, not elsewhere classified (R26.2)     Time: 3383-2919 PT Time Calculation (min) (ACUTE ONLY): 13 min  Charges:  $Gait Training: 8-22 mins                     Arlyce Dice, DPT Acute Rehabilitation Services Pager: [phone] Office: [place] 05/24/2020, 3:56 PM

## 2020-05-25 ENCOUNTER — Inpatient Hospital Stay (HOSPITAL_COMMUNITY)
Admission: EM | Disposition: A | Discharge status: Home Health | Payer: Self-pay | Source: Home / Self Care | Attending: Internal Medicine

## 2020-05-25 DIAGNOSIS — I25119 Atherosclerotic heart disease of native coronary artery with unspecified angina pectoris: Secondary | ICD-10-CM

## 2020-05-25 DIAGNOSIS — R0602 Shortness of breath: Secondary | ICD-10-CM

## 2020-05-25 DIAGNOSIS — E785 Hyperlipidemia, unspecified: Secondary | ICD-10-CM

## 2020-05-25 HISTORY — PX: LEFT HEART CATH AND CORONARY ANGIOGRAPHY: CATH118249

## 2020-05-25 LAB — CBC WITH DIFFERENTIAL/PLATELET
Abs Immature Granulocytes: 0.1 10*3/uL — ABNORMAL HIGH (ref 0.00–0.07)
Basophils Absolute: 0 10*3/uL (ref 0.0–0.1)
Basophils Relative: 0 %
Eosinophils Absolute: 0.1 10*3/uL (ref 0.0–0.5)
Eosinophils Relative: 1 %
HCT: 44.1 % (ref 36.0–46.0)
Hemoglobin: 14.8 g/dL (ref 12.0–15.0)
Immature Granulocytes: 1 %
Lymphocytes Relative: 20 %
Lymphs Abs: 1.5 10*3/uL (ref 0.7–4.0)
MCH: 30.6 pg (ref 26.0–34.0)
MCHC: 33.6 g/dL (ref 30.0–36.0)
MCV: 91.1 fL (ref 80.0–100.0)
Monocytes Absolute: 0.9 10*3/uL (ref 0.1–1.0)
Monocytes Relative: 12 %
Neutro Abs: 4.8 10*3/uL (ref 1.7–7.7)
Neutrophils Relative %: 66 %
Platelets: 365 10*3/uL (ref 150–400)
RBC: 4.84 MIL/uL (ref 3.87–5.11)
RDW: 13.4 % (ref 11.5–15.5)
WBC: 7.4 10*3/uL (ref 4.0–10.5)
nRBC: 0 % (ref 0.0–0.2)

## 2020-05-25 LAB — BASIC METABOLIC PANEL
Anion gap: 8 (ref 5–15)
BUN: 13 mg/dL (ref 8–23)
CO2: 28 mmol/L (ref 22–32)
Calcium: 8.7 mg/dL — ABNORMAL LOW (ref 8.9–10.3)
Chloride: 100 mmol/L (ref 98–111)
Creatinine, Ser: 0.73 mg/dL (ref 0.44–1.00)
GFR calc Af Amer: >60 mL/min (ref >60)
GFR calc non Af Amer: >60 mL/min (ref >60)
Glucose, Bld: 89 mg/dL (ref 70–99)
Potassium: 4.1 mmol/L (ref 3.5–5.1)
Sodium: 136 mmol/L (ref 135–145)

## 2020-05-25 LAB — MAGNESIUM: Magnesium: 2 mg/dL (ref 1.7–2.4)

## 2020-05-25 SURGERY — LEFT HEART CATH AND CORONARY ANGIOGRAPHY
Anesthesia: LOCAL

## 2020-05-25 MED ORDER — SODIUM CHLORIDE 0.9 % IV SOLN: 250.0000 mL | INTRAVENOUS | Status: DC | PRN

## 2020-05-25 MED ORDER — ONDANSETRON HCL 4 MG/2ML IJ SOLN: 4.0000 mg | Freq: Four times a day (QID) | INTRAMUSCULAR | Status: DC | PRN

## 2020-05-25 MED ORDER — HEPARIN (PORCINE) IN NACL 1000-0.9 UT/500ML-% IV SOLN
INTRAVENOUS | Status: DC | PRN
  Administered 2020-05-25 (×2): 500 mL

## 2020-05-25 MED ORDER — IOHEXOL 350 MG/ML SOLN
INTRAVENOUS | Status: DC | PRN
  Administered 2020-05-25: 30 mL via INTRA_ARTERIAL

## 2020-05-25 MED ORDER — DIAZEPAM 5 MG PO TABS: 5.0000 mg | ORAL_TABLET | ORAL | Status: DC | PRN

## 2020-05-25 MED ORDER — LIDOCAINE HCL (PF) 1 % IJ SOLN
INTRAMUSCULAR | Status: AC
  Filled 2020-05-25: qty 30

## 2020-05-25 MED ORDER — HYDRALAZINE HCL 20 MG/ML IJ SOLN: 10.0000 mg | INTRAMUSCULAR | Status: AC | PRN

## 2020-05-25 MED ORDER — HEPARIN (PORCINE) IN NACL 1000-0.9 UT/500ML-% IV SOLN
INTRAVENOUS | Status: AC
  Filled 2020-05-25: qty 1000

## 2020-05-25 MED ORDER — ASPIRIN 81 MG PO CHEW
81.0000 mg | CHEWABLE_TABLET | Freq: Every day | ORAL | Status: DC
  Administered 2020-05-25: 81 mg via ORAL
  Filled 2020-05-25: qty 1

## 2020-05-25 MED ORDER — ACETAMINOPHEN 325 MG PO TABS: 650.0000 mg | ORAL_TABLET | ORAL | Status: DC | PRN

## 2020-05-25 MED ORDER — VERAPAMIL HCL 2.5 MG/ML IV SOLN
INTRAVENOUS | Status: AC
  Filled 2020-05-25: qty 2

## 2020-05-25 MED ORDER — FENTANYL CITRATE (PF) 100 MCG/2ML IJ SOLN
INTRAMUSCULAR | Status: AC
  Filled 2020-05-25: qty 2

## 2020-05-25 MED ORDER — SODIUM CHLORIDE 0.9 % IV SOLN: INTRAVENOUS | Status: AC

## 2020-05-25 MED ORDER — FENTANYL CITRATE (PF) 100 MCG/2ML IJ SOLN
INTRAMUSCULAR | Status: DC | PRN
  Administered 2020-05-25: 25 ug via INTRAVENOUS

## 2020-05-25 MED ORDER — SODIUM CHLORIDE 0.9% FLUSH: 3.0000 mL | INTRAVENOUS | Status: DC | PRN

## 2020-05-25 MED ORDER — MIDAZOLAM HCL 2 MG/2ML IJ SOLN
INTRAMUSCULAR | Status: AC
  Filled 2020-05-25: qty 2

## 2020-05-25 MED ORDER — MIDAZOLAM HCL 2 MG/2ML IJ SOLN
INTRAMUSCULAR | Status: DC | PRN
  Administered 2020-05-25: 1 mg via INTRAVENOUS

## 2020-05-25 MED ORDER — LIDOCAINE HCL (PF) 1 % IJ SOLN
INTRAMUSCULAR | Status: DC | PRN
  Administered 2020-05-25: 20 mL via INTRADERMAL
  Administered 2020-05-25: 2 mL via INTRADERMAL

## 2020-05-25 MED ORDER — LABETALOL HCL 5 MG/ML IV SOLN: 10.0000 mg | INTRAVENOUS | Status: AC | PRN

## 2020-05-25 MED ORDER — SODIUM CHLORIDE 0.9% FLUSH
3.0000 mL | Freq: Two times a day (BID) | INTRAVENOUS | Status: DC
  Administered 2020-05-26 – 2020-05-28 (×4): 3 mL via INTRAVENOUS

## 2020-05-25 SURGICAL SUPPLY — 10 items
CATH INFINITI 5FR MULTPACK ANG (CATHETERS) ×1 IMPLANT
GLIDESHEATH SLEND SS 6F .021 (SHEATH) ×1 IMPLANT
KIT HEART LEFT (KITS) ×2 IMPLANT
PACK CARDIAC CATHETERIZATION (CUSTOM PROCEDURE TRAY) ×2 IMPLANT
SHEATH PINNACLE 5F 10CM (SHEATH) ×1 IMPLANT
SHEATH PROBE COVER 6X72 (BAG) ×1 IMPLANT
SYR MEDRAD MARK 7 150ML (SYRINGE) ×2 IMPLANT
TRANSDUCER W/STOPCOCK (MISCELLANEOUS) ×2 IMPLANT
TUBING CIL FLEX 10 FLL-RA (TUBING) ×2 IMPLANT
WIRE EMERALD 3MM-J .035X150CM (WIRE) ×1 IMPLANT

## 2020-05-25 NOTE — Progress Notes (Signed)
PROGRESS NOTE    [name]  AUQ:[id number] DOB: Mar 10, 1946 DOA: 05/20/2020 PCP: [place], [name], MD   Brief Narrative:  Patient is a 74 year old female with history of COPD on 2 L of oxygen at home, peripheral vascular disease, hypothyroidism, hypertension, hyperlipidemia, diverticulosis, coronary artery  disease who presents to the emergency department complaints of dyspnea on exertion. Patient reported dyspnea over the past year and has been on multiple rounds of steroids/antibiotics.  She was also significantly stressed and on grief due to her son passing away in September 2020.  Patient was suspected to have COPD exacerbation.  She was started on steroids, antibiotic.  PCCM is also following.  Echocardiogram done on this admission showed reduced ejection fraction.  Cardiology consulted.  Plan for cardiac cath today.  Assessment & Plan:   Active Problems:   Hypothyroidism   Hyperlipidemia   Essential hypertension   GERD   Chronic mesenteric ischemia (HCC)   CAD (coronary artery disease)   Breast cancer, right breast (HCC)   COPD with acute exacerbation (HCC)   COPD exacerbation (HCC)   Acute on chronic respiratory failure with hypoxia (HCC)   Acute systolic CHF (congestive heart failure) (HCC)   Major depressive disorder, single episode, moderate degree (HCC)   Acute on chronic respiratory failure with hypoxia: Multifactorial from COPD exacerbation and possible systolic congestive heart failure.  She was given few doses of Lasix.  She was treated with IV steroids which has been changed to oral now. Cardiology and PCCM following.  COPD exacerbation: Follows with pulmonology as an outpatient.  Takes trelegy at home.  Continue Singulair.  Pulmonology following here.  IV steroids changed to oral.  Also on Levaquin for COPD exacerbation treatment.  Continue bronchodilators as needed.  Continue supplemental oxygen as needed.  She is on 2 L of oxygen at home.  Systolic congestive heart  failure: Echocardiogram done on this admission showed reduced ejection fraction to 40 to 45%, left ventricle global hypokinesis.  Previous echo done in 2020 October showed ejection fraction of 60 to 65%.  Chest x-ray showed mild pulmonary edema.  She was given few days of Lasix.  She still appears congested.  Troponins have been negative.  Cardiology consulted, plan for cardiac cath.  Continue to monitor input/output, daily weight  Coronary artery disease: Continue aspirin, Lipitor.  Denies any chest pain.  Chronic mesenteric ischemia: Continue Plavix, Pletal, Lipitor  Hypothyroidism: Continue Synthyroid  Hyperlipidemia: Continue statin  Hypertension: Currently blood pressure stable.  Continue current regimen  GERD: Continue PPI  Major depressive order/grief: Patient significantly depressed, stressed due to her son passing away on June 25, 2019.  Reported lack of sleep.  Start on Paxil.          Nutrition Problem: Increased nutrient needs Etiology: acute illness, chronic illness      DVT prophylaxis:Lovenox Code Status: Full Family Communication: Husband at the bedside on 05/24/20 Status is: Inpatient  Remains inpatient appropriate because:Ongoing diagnostic testing needed not appropriate for outpatient work up   Dispo: The patient is from: Home              Anticipated d/c is to: Home              Anticipated d/c date is: tomorrow              Patient currently is not medically stable to d/c. Needs cardiology clearance before discharge.    Consultants: Cardiology,PCCM  Procedures:None  Antimicrobials:  Anti-infectives (From admission, onward)  Start     Dose/Rate Route Frequency Ordered Stop   05/22/20 0800  levofloxacin (LEVAQUIN) tablet 750 mg     Discontinue     750 mg Oral Daily 05/21/20 1323     05/22/20 0730  azithromycin (ZITHROMAX) tablet 500 mg  Status:  Discontinued       "Followed by" Linked Group Details   500 mg Oral Daily 05/21/20 0726  05/21/20 0841   05/21/20 1000  levofloxacin (LEVAQUIN) IVPB 500 mg  Status:  Discontinued        500 mg 100 mL/hr over 60 Minutes Intravenous Every 24 hours 05/21/20 0841 05/21/20 1323   05/21/20 0726  azithromycin (ZITHROMAX) 500 mg in sodium chloride 0.9 % 250 mL IVPB  Status:  Discontinued       "Followed by" Linked Group Details   500 mg 250 mL/hr over 60 Minutes Intravenous Every 24 hours 05/21/20 0726 05/21/20 0841      Subjective: Patient seen and examined at the bedside this morning.  Hemodynamically stable.  Waiting for cath.  She is on 2 L of oxygen per minute.  She states she feels better.  Objective: Vitals:   05/24/20 2137 05/25/20 0138 05/25/20 0552 05/25/20 0742  BP: (!) 143/98  124/82   Pulse: 70  75 80  Resp: 19  19 18   Temp: 98 F (36.7 C)  98.8 F (37.1 C)   TempSrc: Oral  Oral   SpO2: 95% 94% 92% 91%  Weight:      Height:        Intake/Output Summary (Last 24 hours) at 05/25/2020 0806 Last data filed at 05/25/2020 0600 Gross per 24 hour  Intake 440 ml  Output --  Net 440 ml   Filed Weights   05/20/20 1622  Weight: 61 kg    Examination:  General exam: comfortable HEENT:PERRL,Oral mucosa moist, Ear/Nose normal on gross exam Respiratory system: Bilateral diminished air sounds, rhonchi  cardiovascular system: S1 & S2 heard, RRR. No JVD, murmurs, rubs, gallops or clicks. Gastrointestinal system: Abdomen is nondistended, soft and nontender. No organomegaly or masses felt. Normal bowel sounds heard. Central nervous system: Alert and oriented.  Extremities: No edema, no clubbing ,no cyanosis Skin: No rashes, lesions or ulcers,no icterus ,no pallor   Data Reviewed: I have personally reviewed following labs and imaging studies  CBC: Recent Labs  Lab 05/20/20 1657 05/22/20 0540 05/23/20 0506 05/24/20 0521 05/25/20 0512  WBC 7.7 8.6 7.9 7.7 7.4  NEUTROABS 6.9 6.8 5.0 5.2 4.8  HGB 14.7 14.3 15.3* 14.6 14.8  HCT 44.5 43.3 47.4* 45.1 44.1  MCV  92.7 93.1 92.8 93.0 91.1  PLT 430* 409* 412* 389 976   Basic Metabolic Panel: Recent Labs  Lab 05/21/20 0919 05/22/20 0540 05/23/20 0506 05/24/20 0521 05/25/20 0512  NA 138 136 130* 134* 136  K 4.1 4.2 3.7 3.8 4.1  CL 100 98 91* 98 100  CO2 28 28 28 28 28   GLUCOSE 156* 141* 96 96 89  BUN 15 27* 21 17 13   CREATININE 0.74 0.83 0.87 0.76 0.73  CALCIUM 9.5 9.2 8.9 8.8* 8.7*  MG 2.1 2.4 2.2 2.1 2.0  PHOS 3.6  --   --   --   --    GFR: Estimated Creatinine Clearance: 56.9 mL/min (by C-G formula based on SCr of 0.73 mg/dL). Liver Function Tests: Recent Labs  Lab 05/21/20 0919  AST 29  ALT 32  ALKPHOS 72  BILITOT 0.3  PROT 8.0  ALBUMIN 4.2  No results for input(s): LIPASE, AMYLASE in the last 168 hours. No results for input(s): AMMONIA in the last 168 hours. Coagulation Profile: No results for input(s): INR, PROTIME in the last 168 hours. Cardiac Enzymes: No results for input(s): CKTOTAL, CKMB, CKMBINDEX, TROPONINI in the last 168 hours. BNP (last 3 results) No results for input(s): PROBNP in the last 8760 hours. HbA1C: No results for input(s): HGBA1C in the last 72 hours. CBG: No results for input(s): GLUCAP in the last 168 hours. Lipid Profile: No results for input(s): CHOL, HDL, LDLCALC, TRIG, CHOLHDL, LDLDIRECT in the last 72 hours. Thyroid Function Tests: No results for input(s): TSH, T4TOTAL, FREET4, T3FREE, THYROIDAB in the last 72 hours. Anemia Panel: No results for input(s): VITAMINB12, FOLATE, FERRITIN, TIBC, IRON, RETICCTPCT in the last 72 hours. Sepsis Labs: No results for input(s): PROCALCITON, LATICACIDVEN in the last 168 hours.  Recent Results (from the past 240 hour(s))  SARS Coronavirus 2 by RT PCR (hospital order, performed in [facility] hospital lab) Nasopharyngeal Nasopharyngeal Swab     Status: None   Collection Time: 05/20/20  4:57 PM   Specimen: Nasopharyngeal Swab  Result Value Ref Range Status   SARS Coronavirus 2 NEGATIVE NEGATIVE Final     Comment: (NOTE) SARS-CoV-2 target nucleic acids are NOT DETECTED.  The SARS-CoV-2 RNA is generally detectable in upper and lower respiratory specimens during the acute phase of infection. The lowest concentration of SARS-CoV-2 viral copies this assay can detect is 250 copies / mL. A negative result does not preclude SARS-CoV-2 infection and should not be used as the sole basis for treatment or other patient management decisions.  A negative result may occur with improper specimen collection / handling, submission of specimen other than nasopharyngeal swab, presence of viral mutation(s) within the areas targeted by this assay, and inadequate number of viral copies (<250 copies / mL). A negative result must be combined with clinical observations, patient history, and epidemiological information.  Fact Sheet for Patients:   StrictlyIdeas.no  Fact Sheet for Healthcare Providers: BankingDealers.co.za  This test is not yet approved or  cleared by the [place] FDA and has been authorized for detection and/or diagnosis of SARS-CoV-2 by FDA under an Emergency Use Authorization (EUA).  This EUA will remain in effect (meaning this test can be used) for the duration of the COVID-19 declaration under Section 564(b)(1) of the Act, 21 U.S.C. section 360bbb-3(b)(1), unless the authorization is terminated or revoked sooner.  Performed at [facility], [place] [address], [place], [place] [address]   Respiratory Panel by PCR     Status: None   Collection Time: 05/21/20  7:27 AM   Specimen: Nasopharyngeal Swab; Respiratory  Result Value Ref Range Status   Adenovirus NOT DETECTED NOT DETECTED Final   Coronavirus 229E NOT DETECTED NOT DETECTED Final    Comment: (NOTE) The Coronavirus on the Respiratory Panel, DOES NOT test for the novel  Coronavirus (2019 nCoV)    Coronavirus HKU1 NOT DETECTED NOT DETECTED Final   Coronavirus  NL63 NOT DETECTED NOT DETECTED Final   Coronavirus OC43 NOT DETECTED NOT DETECTED Final   Metapneumovirus NOT DETECTED NOT DETECTED Final   Rhinovirus / Enterovirus NOT DETECTED NOT DETECTED Final   Influenza A NOT DETECTED NOT DETECTED Final   Influenza B NOT DETECTED NOT DETECTED Final   Parainfluenza Virus 1 NOT DETECTED NOT DETECTED Final   Parainfluenza Virus 2 NOT DETECTED NOT DETECTED Final   Parainfluenza Virus 3 NOT DETECTED NOT DETECTED Final  Parainfluenza Virus 4 NOT DETECTED NOT DETECTED Final   Respiratory Syncytial Virus NOT DETECTED NOT DETECTED Final   Bordetella pertussis NOT DETECTED NOT DETECTED Final   Chlamydophila pneumoniae NOT DETECTED NOT DETECTED Final   Mycoplasma pneumoniae NOT DETECTED NOT DETECTED Final    Comment: Performed at [facility], [place] [address] [place], [place] [address]         Radiology Studies: No results found.      Scheduled Meds: . aspirin EC  81 mg Oral Daily  . atorvastatin  80 mg Oral Daily  . cilostazol  100 mg Oral BID  . dextromethorphan-guaiFENesin  2 tablet Oral BID  . docusate sodium  100 mg Oral BID  . enoxaparin (LOVENOX) injection  40 mg Subcutaneous Q24H  . feeding supplement (ENSURE ENLIVE)  237 mL Oral Q24H  . ipratropium-albuterol  3 mL Nebulization Q6H  . levofloxacin  750 mg Oral Daily  . levothyroxine  112 mcg Oral Q0600  . LORazepam  2 mg Oral QHS  . mometasone-formoterol  1 puff Inhalation BID  . montelukast  10 mg Oral QHS  . pantoprazole  40 mg Oral Daily  . PARoxetine  20 mg Oral QHS  . predniSONE  40 mg Oral Q breakfast  . sodium chloride flush  3 mL Intravenous Q12H  . sodium chloride flush  3 mL Intravenous Q12H  . sodium chloride flush  3 mL Intravenous Q12H   Continuous Infusions: . sodium chloride    . sodium chloride 10 mL/hr at 05/25/20 0508     LOS: 5 days    Time spent: 35 mins.More than 50% of that time was spent in counseling and/or coordination of  care.      Shelly Coss, MD [facility] P8/08/2020, 8:06 AM

## 2020-05-25 NOTE — Progress Notes (Signed)
Site area- right  Site Prior to Removal- 0   Pressure Applied For-  20 MInutes   Bedrest Beginning at - 1135   Manual- Yes   Patient Status During Pull- Stable    Post Pull Groin Site- 0   Post Pull Instructions Given- Yes   Post Pull Pulses Present- Yes    Dressing Applied- Tegaderm and Gauze Dressing    Comments:  tol well

## 2020-05-25 NOTE — Interval H&P Note (Signed)
Cath Lab Visit (complete for each Cath Lab visit)  Clinical Evaluation Leading to the Procedure:   ACS: No.  Non-ACS:    Anginal Classification: CCS II  Anti-ischemic medical therapy: Minimal Therapy (1 class of medications)  Non-Invasive Test Results: No non-invasive testing performed  Prior CABG: No previous CABG      History and Physical Interval Note:  05/25/2020 10:09 AM  [name]  has presented today for surgery, with the diagnosis of angina.  The various methods of treatment have been discussed with the patient and family. After consideration of risks, benefits and other options for treatment, the patient has consented to  Procedure(s): LEFT HEART CATH AND CORONARY ANGIOGRAPHY (N/A) as a surgical intervention.  The patient's history has been reviewed, patient examined, no change in status, stable for surgery.  I have reviewed the patient's chart and labs.  Questions were answered to the patient's satisfaction.     Shelva Majestic

## 2020-05-25 NOTE — Progress Notes (Signed)
Received call from Cardiology that patient will be having heart catheterization 05/25/2020 at [facility] approximately 8:30 a.m. - 9:00 a.m.

## 2020-05-25 NOTE — Progress Notes (Addendum)
Progress Note  Patient Name: [name] Date of Encounter: 05/25/2020  [facility] Cardiologist: Fransico Him, MD   Subjective   No chest pain or SOB, was sleeping soundly when I arrived  Inpatient Medications    Scheduled Meds: . aspirin EC  81 mg Oral Daily  . atorvastatin  80 mg Oral Daily  . cilostazol  100 mg Oral BID  . dextromethorphan-guaiFENesin  2 tablet Oral BID  . docusate sodium  100 mg Oral BID  . enoxaparin (LOVENOX) injection  40 mg Subcutaneous Q24H  . feeding supplement (ENSURE ENLIVE)  237 mL Oral Q24H  . ipratropium-albuterol  3 mL Nebulization Q6H  . levofloxacin  750 mg Oral Daily  . levothyroxine  112 mcg Oral Q0600  . LORazepam  2 mg Oral QHS  . mometasone-formoterol  1 puff Inhalation BID  . montelukast  10 mg Oral QHS  . pantoprazole  40 mg Oral Daily  . PARoxetine  20 mg Oral QHS  . predniSONE  40 mg Oral Q breakfast  . sodium chloride flush  3 mL Intravenous Q12H  . sodium chloride flush  3 mL Intravenous Q12H  . sodium chloride flush  3 mL Intravenous Q12H   Continuous Infusions: . sodium chloride    . sodium chloride 10 mL/hr at 05/25/20 0508   PRN Meds: sodium chloride, acetaminophen **OR** acetaminophen, albuterol, fluticasone, HYDROcodone-acetaminophen, ipratropium-albuterol, ondansetron **OR** ondansetron (ZOFRAN) IV, sodium chloride flush   Vital Signs    Vitals:   05/24/20 2137 05/25/20 0138 05/25/20 0552 05/25/20 0742  BP: (!) 143/98  124/82   Pulse: 70  75 80  Resp: 19  19 18   Temp: 98 F (36.7 C)  98.8 F (37.1 C)   TempSrc: Oral  Oral   SpO2: 95% 94% 92% 91%  Weight:      Height:        Intake/Output Summary (Last 24 hours) at 05/25/2020 0752 Last data filed at 05/25/2020 0600 Gross per 24 hour  Intake 440 ml  Output --  Net 440 ml   Last 3 Weights 05/20/2020 04/25/2020 04/06/2020  Weight (lbs) 134 lb 7.7 oz 139 lb 6.4 oz 134 lb  Weight (kg) 61 kg 63.231 kg 60.782 kg      Telemetry    SR - Personally  Reviewed  ECG    No new - Personally Reviewed  Physical Exam   GEN: No acute distress.   Neck: No JVD Cardiac: RRR, no murmurs, rubs, or gallops.  Respiratory: diminished in bases and throughout  to auscultation bilaterally with occ wheeze. GI: Soft, nontender, non-distended  MS: No edema; No deformity. Neuro:  Nonfocal  Psych: Normal affect   Labs    High Sensitivity Troponin:   Recent Labs  Lab 05/20/20 1657 05/20/20 1855 05/23/20 1100  TROPONINIHS 15 12 19*      Chemistry Recent Labs  Lab 05/21/20 0919 05/22/20 0540 05/23/20 0506 05/24/20 0521 05/25/20 0512  NA 138   < > 130* 134* 136  K 4.1   < > 3.7 3.8 4.1  CL 100   < > 91* 98 100  CO2 28   < > 28 28 28   GLUCOSE 156*   < > 96 96 89  BUN 15   < > 21 17 13   CREATININE 0.74   < > 0.87 0.76 0.73  CALCIUM 9.5   < > 8.9 8.8* 8.7*  PROT 8.0  --   --   --   --  ALBUMIN 4.2  --   --   --   --   AST 29  --   --   --   --   ALT 32  --   --   --   --   ALKPHOS 72  --   --   --   --   BILITOT 0.3  --   --   --   --   GFRNONAA >60   < > >60 >60 >60  GFRAA >60   < > >60 >60 >60  ANIONGAP 10   < > 11 8 8    < > = values in this interval not displayed.     Hematology Recent Labs  Lab 05/23/20 0506 05/24/20 0521 05/25/20 0512  WBC 7.9 7.7 7.4  RBC 5.11 4.85 4.84  HGB 15.3* 14.6 14.8  HCT 47.4* 45.1 44.1  MCV 92.8 93.0 91.1  MCH 29.9 30.1 30.6  MCHC 32.3 32.4 33.6  RDW 13.8 13.5 13.4  PLT 412* 389 365    BNP Recent Labs  Lab 05/20/20 1657  BNP 99.7     DDimer No results for input(s): DDIMER in the last 168 hours.   Radiology    No results found.  Cardiac Studies   Echo 05/21/20 IMPRESSIONS    1. Left ventricular ejection fraction, by estimation, is 45 to 50%. The  left ventricle has mildly decreased function. The left ventricle  demonstrates global hypokinesis. Indeterminate diastolic filling due to  E-A fusion. Definity contrast administered  but images remain suboptimal to exclude  regional wall motion  abnormalities.  2. Right ventricular systolic function is normal. The right ventricular  size is normal. Tricuspid regurgitation signal is inadequate for assessing  PA pressure.  3. Left atrial size was mildly dilated.  4. The mitral valve is degenerative. Trivial mitral valve regurgitation.  Mild mitral stenosis. The mean mitral valve gradient is 3.0 mmHg with  average heart rate of 85 bpm.  5. The aortic valve is grossly normal. Aortic valve regurgitation is not  visualized. No aortic stenosis is present.  6. The inferior vena cava is dilated in size with >50% respiratory  variability, suggesting right atrial pressure of 8 mmHg.   Comparison(s): A prior study was performed on 08/03/2019. Prior images  reviewed side by side. LVEF compared to prior appears reduced.   FINDINGS  Left Ventricle: Left ventricular ejection fraction, by estimation, is 45  to 50%. The left ventricle has mildly decreased function. The left  ventricle demonstrates global hypokinesis. Definity contrast agent was  given IV to delineate the left ventricular  endocardial borders. The left ventricular internal cavity size was normal  in size. There is no left ventricular hypertrophy. Indeterminate diastolic  filling due to E-A fusion.   Right Ventricle: The right ventricular size is normal. Right vetricular  wall thickness was not assessed. Right ventricular systolic function is  normal. Tricuspid regurgitation signal is inadequate for assessing PA  pressure.   Left Atrium: Left atrial size was mildly dilated.   Right Atrium: Right atrial size was normal in size.   Pericardium: There is no evidence of pericardial effusion.   Mitral Valve: The mitral valve is degenerative in appearance. Normal  mobility of the mitral valve leaflets. Mild to moderate mitral annular  calcification. Trivial mitral valve regurgitation. Mild mitral valve  stenosis. The mean mitral valve gradient is   3.0 mmHg with average heart rate of 85 bpm.   Tricuspid Valve: The tricuspid valve is normal  in structure. Tricuspid  valve regurgitation is trivial. No evidence of tricuspid stenosis.   Aortic Valve: The aortic valve is grossly normal. Aortic valve  regurgitation is not visualized. No aortic stenosis is present.   Pulmonic Valve: The pulmonic valve was normal in structure. Pulmonic valve  regurgitation is not visualized. No evidence of pulmonic stenosis.   Aorta: The aortic root is normal in size and structure.   Venous: The inferior vena cava is dilated in size with greater than 50%  respiratory variability, suggesting right atrial pressure of 8 mmHg.   IAS/Shunts: No atrial level shunt detected by color flow Doppler.    Patient Profile     74 y.o. female with a hx of HTN, COPD, breast cancer, HLD, PAD (Dr. Fletcher Anon treated medically) -mesenteric vascular disease (Dr. Bridgett Larsson), CAD and has gone back to tobacco use since death of her sonnow with acute COPD exacerbation but with CHF and possible ischemia playing a role.   Assessment & Plan    1. COPD exacerbation with hypoxia and mild CHF. Treated HF with 2 doses of Lasix.  now only neg 995 since admit was neg 1935  2. DOE with known CAD and new EKG changes, troponin negative. + CAD on chest CTA. Concern for increase of her CAD playing a role with her dyspnea in combination of COPD. Cardiac cath would be beneficial to evaluate. Will discuss timing Dr. Margaretann Loveless to see. -- cardiac cath today  3. CAD last cath 2012 with mild disease.  4. 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 Followed by Dr. Donzetta Matters. Stable though episodes of diarrhea.  Needs follow up as outpt with GI and Dr. Donzetta Matters  5. Hx tobacco use, recurrent for a month or so in Jan 2021 but no longer using tobacco.   6. HLD has been on lipitor continue       For questions or updates, please contact [place] Please consult [email or web address] for  contact info under        Signed, Cecilie Kicks, NP  05/25/2020, 7:52 AM    Patient seen and examined with Cecilie Kicks, NP.  Agree as above, with the following exceptions and changes as noted below.  Patient seen after cath in the recovery bay.  She is feeling well.  No obstructive CAD on cath.  LVEF felt to be hyperdynamic EF estimated at 65%.  LVEDP 10 mmHg.  This finding is in contrast to the echocardiogram from her admission.  I have independently reviewed the left ventriculogram obtained during Today, concur with the assessment of hyperdynamic LV function.  This suggests that her echocardiogram obtained on admission may have a component of stress cardiomyopathic process.  Gen: NAD, CV: RRR, no murmurs, Lungs: clear, Abd: soft, Extrem: Warm, well perfused, no edema, Neuro/Psych: alert and oriented x 3, normal mood and affect. All available labs, radiology testing, previous records reviewed.  She will need medical therapy for secondary prevention of CAD, aspirin 81 mg daily, atorvastatin 80 mg daily.  Not currently on beta-blockade.  No further cardiovascular work-up required inpatient.  It does not appear that she has a diuretic on her home medications, and with a hyperdynamic left ventricle and an LVEDP of 10 mmHg, this can be deferred and reevaluated at follow-up.  I will plan to evaluate the patient's femoral access site tomorrow, otherwise cardiology will sign off at this time.  Elouise Munroe, MD 05/25/20 4:45 PM

## 2020-05-26 ENCOUNTER — Encounter (HOSPITAL_COMMUNITY): Payer: Self-pay | Admitting: Cardiovascular Disease

## 2020-05-26 ENCOUNTER — Inpatient Hospital Stay (HOSPITAL_COMMUNITY): Payer: Medicare Other

## 2020-05-26 ENCOUNTER — Encounter: Payer: Medicare Other | Admitting: Physical Therapy

## 2020-05-26 ENCOUNTER — Telehealth: Payer: Self-pay | Admitting: Nurse Practitioner

## 2020-05-26 LAB — BASIC METABOLIC PANEL
Anion gap: 4 — ABNORMAL LOW (ref 5–15)
BUN: 14 mg/dL (ref 8–23)
CO2: 28 mmol/L (ref 22–32)
Calcium: 8.3 mg/dL — ABNORMAL LOW (ref 8.9–10.3)
Chloride: 101 mmol/L (ref 98–111)
Creatinine, Ser: 0.65 mg/dL (ref 0.44–1.00)
GFR calc Af Amer: >60 mL/min (ref >60)
GFR calc non Af Amer: >60 mL/min (ref >60)
Glucose, Bld: 94 mg/dL (ref 70–99)
Potassium: 4 mmol/L (ref 3.5–5.1)
Sodium: 133 mmol/L — ABNORMAL LOW (ref 135–145)

## 2020-05-26 LAB — CBC WITH DIFFERENTIAL/PLATELET
Abs Immature Granulocytes: 0.06 10*3/uL (ref 0.00–0.07)
Basophils Absolute: 0.1 10*3/uL (ref 0.0–0.1)
Basophils Relative: 1 %
Eosinophils Absolute: 0.7 10*3/uL — ABNORMAL HIGH (ref 0.0–0.5)
Eosinophils Relative: 7 %
HCT: 43.2 % (ref 36.0–46.0)
Hemoglobin: 13.9 g/dL (ref 12.0–15.0)
Immature Granulocytes: 1 %
Lymphocytes Relative: 11 %
Lymphs Abs: 1.1 10*3/uL (ref 0.7–4.0)
MCH: 30.3 pg (ref 26.0–34.0)
MCHC: 32.2 g/dL (ref 30.0–36.0)
MCV: 94.1 fL (ref 80.0–100.0)
Monocytes Absolute: 1.1 10*3/uL — ABNORMAL HIGH (ref 0.1–1.0)
Monocytes Relative: 12 %
Neutro Abs: 6.8 10*3/uL (ref 1.7–7.7)
Neutrophils Relative %: 68 %
Platelets: 320 10*3/uL (ref 150–400)
RBC: 4.59 MIL/uL (ref 3.87–5.11)
RDW: 13.8 % (ref 11.5–15.5)
WBC: 9.7 10*3/uL (ref 4.0–10.5)
nRBC: 0 % (ref 0.0–0.2)

## 2020-05-26 LAB — MAGNESIUM: Magnesium: 1.9 mg/dL (ref 1.7–2.4)

## 2020-05-26 MED ORDER — ALBUTEROL SULFATE (2.5 MG/3ML) 0.083% IN NEBU: 2.5000 mg | INHALATION_SOLUTION | RESPIRATORY_TRACT | Status: DC | PRN

## 2020-05-26 MED ORDER — PANTOPRAZOLE SODIUM 40 MG PO TBEC
40.0000 mg | DELAYED_RELEASE_TABLET | Freq: Two times a day (BID) | ORAL | Status: DC
  Administered 2020-05-26 – 2020-05-28 (×5): 40 mg via ORAL
  Filled 2020-05-26 (×5): qty 1

## 2020-05-26 MED ORDER — METHYLPREDNISOLONE SODIUM SUCC 40 MG IJ SOLR
40.0000 mg | INTRAMUSCULAR | Status: DC
  Administered 2020-05-26 – 2020-05-27 (×2): 40 mg via INTRAVENOUS
  Filled 2020-05-26 (×2): qty 1

## 2020-05-26 MED ORDER — OXYCODONE HCL 5 MG PO TABS: 10.0000 mg | ORAL_TABLET | Freq: Four times a day (QID) | ORAL | Status: DC | PRN

## 2020-05-26 MED ORDER — FLUTICASONE PROPIONATE 50 MCG/ACT NA SUSP
1.0000 | Freq: Two times a day (BID) | NASAL | Status: DC
  Administered 2020-05-26 – 2020-05-28 (×5): 1 via NASAL
  Filled 2020-05-26: qty 16

## 2020-05-26 MED ORDER — BUDESONIDE 0.25 MG/2ML IN SUSP
0.2500 mg | Freq: Two times a day (BID) | RESPIRATORY_TRACT | Status: DC
  Administered 2020-05-26 – 2020-05-29 (×7): 0.25 mg via RESPIRATORY_TRACT
  Filled 2020-05-26 (×7): qty 2

## 2020-05-26 MED ORDER — FAMOTIDINE 20 MG PO TABS
40.0000 mg | ORAL_TABLET | Freq: Every day | ORAL | Status: DC
  Administered 2020-05-26 – 2020-05-28 (×3): 40 mg via ORAL
  Filled 2020-05-26 (×3): qty 2

## 2020-05-26 MED FILL — Verapamil HCl IV Soln 2.5 MG/ML: INTRAVENOUS | Qty: 2 | Status: AC

## 2020-05-26 NOTE — Progress Notes (Signed)
NAME:  [name], MRN:  [id number], DOB:  October 31, 1945, LOS: 6 ADMISSION DATE:  05/20/2020, CONSULTATION DATE:  05/21/2020 REFERRING MD:  [facility], CHIEF COMPLAINT: Shortness of breath  Brief History   74 year old female with extensive past medical history is followed by Dr. Chase Caller has not felt well since September 2020. Frequent exacerbations of respiratory treatment with multiple antimicrobial steroids O2 dependent at 2 L nocturnal oxygen today. Reports have increased wheezing shortness of breath denies chest pain. She has a cough that is nonproductive. She presented multiple times in the office and there was to be seen on 05/21/2019 office unfortunately the office while. Patient presented with emergency department for further evaluation and treatment. She was admitted by [facility] hospitalist team pulmonary critical care was asked to evaluate 05/21/2020.  Past Medical History   Past Medical History:  Diagnosis Date  . Bilateral leg cramps   . Bladder neoplasm   . Chronic deep vein thrombosis (DVT) of right lower extremity ([place])    05/ 2018  chronic non-occlusive DVT RLE  . COPD, frequent exacerbations ([place])    03-06-2018 per pt last exacerbation 12/ 2018  . Coronary artery disease    cardiologist-  dr Aundra Dubin-- 03-11-2018 er cath , 80% stenosis in the ostial second diagonal  . Dyspnea on minimal exertion   . Emphysema/COPD ([place])    CAT score- 17  . Family history of breast cancer   . Family history of colon cancer   . Family history of melanoma   . Family history of ovarian cancer   . Family history of pancreatic cancer   . Fibromyalgia 1995  . GERD (gastroesophageal reflux disease)   . Hiatal hernia   . History of breast cancer   . History of diverticulitis of colon    2002  s/p  sigmoid colectomy  . History of multiple pulmonary nodules    hx RUL nodules x2  s/p right VATS w/ wedge resection 09-11-2005 and 06-14-2011  both  necrotizing granulomatous inflammation w/ cystic area of  necrosis & focal calcification  . History of right breast cancer oncologist-  dr Jana Hakim-- no recurrence   dx 2010--  IDC, Stage IA , ER/PR+,  (pT1c pN0) 11-09-2008 right lumpectomy;  12-18-2008 right simple mastectomy for DCIS margins;  completed chemotherapy 2010 (no radiation) and completed antiestrogen therapy  . Hx of colonic polyps    Dr. Cristina Gong -last study '11  . Hyperlipidemia   . Hypertension   . Hypothyroidism   . 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 Nocturia   . OA (osteoarthritis)    thumbs  . On supplemental oxygen therapy    03-06-2018 per pt uses only at night,  checks O2 sats at home,  stated am sat 89% after moving around average 93-94% with RA  . Osteoporosis   . Peripheral arterial occlusive disease [facility]) vascular-- dr chen/ dr Donzetta Matters   proximal right SFA severe focal stenosis with collaterals from the PFA/  04/ 2012 occluded celiac and SMA arteries with distal reconstitution w/ patent IMA  . Peripheral vascular disease ([place])    chronic DVT RLE,  mesenteric vascular disease  . Right lower lobe pulmonary nodule    Chest CT 08-29-2017  . Stage 3 severe COPD by GOLD classification ([place]) hx frequent exacerbations--   pulmologist-  dr Maryann Alar--  per lov note , dated 12-20-2017, oxyogen 2L is prescribed for use with exertion (but pt only uses mostly at night), O2 sats  on RA run the 80s , this day sat 86% RA and with 2L O2 sat 92%  . Varicose veins of leg with swelling    varicose vein surgery - Dr. Aleda Grana  . Wears glasses   . Wears hearing aid in both ears    Significant Hospital Events    Consults:  Pulmonary 05/21/2020  Procedures:  None  Significant Diagnostic Tests:  Echocardiogram  Micro Data:    Antimicrobials:   Levaquin8/7 >  Interim history/subjective:  No distress but is more short of breath  Objective   Blood pressure (Abnormal) 83/68, pulse 87, temperature 97.6 F (36.4 C), temperature source Oral,  resp. rate 20, height 5\' 6"  (1.676 m), weight 61 kg, SpO2 91 %.        Intake/Output Summary (Last 24 hours) at 05/26/2020 1349 Last data filed at 05/26/2020 0600 Gross per 24 hour  Intake 180 ml  Output 0 ml  Net 180 ml   Filed Weights   05/20/20 1622  Weight: 61 kg    Examination: General this is a chronically ill appearing white female. She is up in chair and in no distress.  HENT NCAT marked loud and turbulent upper airway noises w/ prom upper airway wheezing Pulm exp wheeze no accessory use. Marked upper airway noises really the large factor Card RRR abd not tender  Ext brisk CR Neuro intact  Resolved Hospital Problem list     Assessment & Plan:  Acute on chronic hypoxemic respiratory failure in the setting of COPD c/b acute pulmonary edema -had improved initially but now worse again.  -IM increased systemic steroids again.  -pcxr clear. No clear effusions/edema or atx.  -has significant h/o reflux, known hiatal hernia and cough and symptoms that suggest upper airway irritation/reflux LPR may be a large contributing factor  Plan Cont systemic steroids Inc PPI to BID AND HS pepcid Schedule her nasal steroid (also has post-nasal gtt) Supplemental oxygen Will get esophagram   Decompensated systolic  heart failure (EF 40-45%) w/ known CAD-->Cardiac cath 8/11:  No significant coronary obstructive disease. Hypertension Plan Per cards and primary   Erick Colace ACNP-BC [place] Pager # [phone] OR # [phone] if no answer

## 2020-05-26 NOTE — Discharge Instructions (Signed)

## 2020-05-26 NOTE — Progress Notes (Signed)
Physical Therapy Treatment Patient Details Name: [name] MRN: [id number] DOB: [birth year] Today's Date: 05/26/2020    History of Present Illness [name] is a 74 year old Caucasian female with PMH COPD (on 2 L chronic nasal cannula at home), ongoing grief/mourning due to son passing away 13-Jul-2019, hard of hearing, PVD, hypothyroidism, hypertension, hyperlipidemia, diverticulosis, CAD who presented to the hospital with worsening dyspnea at home.  She states that she has had multiple episodes over the past year of severe shortness of breath and has been on multiple rounds of steroids and antibiotics.  She associates much of her dyspnea due to stress episodes centered around grieving after her son passed away last 07/13/23.  Pt admitted for COPD exacerbation with hypoxia and mild CHF.  Underwent L Heart Cath/Angiography 05-25-20    PT Comments    POD # 1 L Heart Cath/Angiography "I feel exhausted" Assisted to bathroom then amb in hallway.  General Gait Details: min/guard for safety, cues for pursed lip breathing, standing rest break required, SPO2 86% on 2L O2  and present with 3/4 dyspnea.  Required an extended rest break to recover.    Follow Up Recommendations  Home health PT     Equipment Recommendations  None recommended by PT    Recommendations for Other Services       Precautions / Restrictions Precautions Precautions: Fall Precaution Comments: monitor sats, 2L O2 baseline Restrictions Weight Bearing Restrictions: No    Mobility  Bed Mobility Overal bed mobility: Modified Independent             General bed mobility comments: pt OOB in recliner  Transfers Overall transfer level: Needs assistance Equipment used: None Transfers: Sit to/from Stand;Stand Pivot Transfers Sit to Stand: Supervision Stand pivot transfers: Supervision;Min guard       General transfer comment: good safety cognition and use of hands to steady  self  Ambulation/Gait Ambulation/Gait assistance: Supervision;Min guard Gait Distance (Feet): 75 Feet Assistive device: None Gait Pattern/deviations: Step-through pattern;Decreased stride length;Trunk flexed Gait velocity: decreased   General Gait Details: min/guard for safety, cues for pursed lip breathing, standing rest break required, SPO2 86% on 2L O2 [place] and present with 3/4 dyspnea   Stairs             Wheelchair Mobility    Modified Rankin (Stroke Patients Only)       Balance                                            Cognition Arousal/Alertness: Awake/alert Behavior During Therapy: WFL for tasks assessed/performed Overall Cognitive Status: Within Functional Limits for tasks assessed                                 General Comments: AxO x 3 very pleasant      Exercises      General Comments General comments (skin integrity, edema, etc.): patient BP seated EOB after sink side grooming and toileting 83/66 patient asypmtomatic      Pertinent Vitals/Pain Pain Assessment: No/denies pain    Home Living                      Prior Function            PT Goals (current goals can now be found in the  care plan section) Acute Rehab PT Goals Patient Stated Goal: home Progress towards PT goals: Progressing toward goals    Frequency    Min 3X/week      PT Plan Current plan remains appropriate    Co-evaluation              AM-PAC PT "6 Clicks" Mobility   Outcome Measure  Help needed turning from your back to your side while in a flat bed without using bedrails?: A Little Help needed moving from lying on your back to sitting on the side of a flat bed without using bedrails?: A Little Help needed moving to and from a bed to a chair (including a wheelchair)?: A Little Help needed standing up from a chair using your arms (e.g., wheelchair or bedside chair)?: A Little Help needed to walk in hospital room?:  A Little Help needed climbing 3-5 steps with a railing? : A Lot 6 Click Score: 17    End of Session Equipment Utilized During Treatment: Gait belt Activity Tolerance: Patient limited by fatigue Patient left: in chair;with call bell/phone within reach;with chair alarm set Nurse Communication: Mobility status PT Visit Diagnosis: Difficulty in walking, not elsewhere classified (R26.2)     Time: 1125-1150 PT Time Calculation (min) (ACUTE ONLY): 25 min  Charges:  $Gait Training: 8-22 mins $Therapeutic Activity: 8-22 mins                     [name]  PTA Acute  Rehabilitation Services Pager      [phone] Office      [phone]

## 2020-05-26 NOTE — TOC Transition Note (Signed)
Transition of Care [facility]) - CM/SW Discharge Note   Patient Details  Name: NAVEAH BRAVE MRN: [id number] Date of Birth: 1946/09/19  Transition of Care [place] [facility]) CM/SW Contact:  Dessa Phi, RN Phone Number: 05/26/2020, 9:54 AM   Clinical Narrative:d/c home w/HHC-active w/[facility];already has home 02. No further CM needs.       Barriers to Discharge: No Barriers Identified   Patient Goals and CMS Choice Patient states their goals for this hospitalization and ongoing recovery are:: to go home with therapy CMS Medicare.gov Compare Post Acute Care list provided to:: Patient Choice offered to / list presented to : Patient  Discharge Placement                       Discharge Plan and Services   Discharge Planning Services: CM Consult Post Acute Care Choice: Home Health          DME Arranged: N/A DME Agency: NA       HH Arranged: PT HH Agency: [facility] Date [place]: 05/23/20 Time [place]: [id number] Representative spoke with at [place] ([place]) Interventions     Readmission Risk Interventions No flowsheet data found.

## 2020-05-26 NOTE — Progress Notes (Signed)
Occupational Therapy Progress Note  Patient education in pursed lip breathing and diaphragmatic breathing techniques due to wheezing with minimal activity. Patient drop to 87% on room air with standing at sink side for grooming, educate patient on monitoring O2 saturations at home with personal pulse ox and energy conservation strategies such as performing ADLs seated. Patient supervision level for functional transfers, patient primary concern is still having difficulty breathing. Will continue acute OT services to maximize activity tolerance and further education on breathing exercises/energy conservation strategies.    05/26/20 1300  OT Visit Information  Last OT Received On 05/26/20  Assistance Needed +1  History of Present Illness [name] is a 74 year old Caucasian female with PMH COPD (on 2 L chronic nasal cannula at home), ongoing grief/mourning due to son passing away 07-01-2019, hard of hearing, PVD, hypothyroidism, hypertension, hyperlipidemia, diverticulosis, CAD who presented to the hospital with worsening dyspnea at home.  She states that she has had multiple episodes over the past year of severe shortness of breath and has been on multiple rounds of steroids and antibiotics.  She associates much of her dyspnea due to stress episodes centered around grieving after her son passed away last 2023/07/01.  Pt admitted for COPD exacerbation with hypoxia and mild CHF  Precautions  Precautions Fall  Precaution Comments monitor sats, 2L O2 baseline  Pain Assessment  Pain Assessment No/denies pain  Cognition  Arousal/Alertness Awake/alert  Behavior During Therapy WFL for tasks assessed/performed  Overall Cognitive Status Within Functional Limits for tasks assessed  ADL  Overall ADL's  Needs assistance/impaired  Grooming Wash/dry face;Wash/dry hands;Oral care;Standing;Supervision/safety  Grooming Details (indicate cue type and reason) patient desaturates to 87% on room with standing g/h,  max cues for pursed lip breathing techniques   Toilet Transfer Min guard;Ambulation;Regular Toilet;Cueing for safety  Toilet Transfer Details (indicate cue type and reason) patient 90-91% on room air seated on toilet  Toileting- Clothing Manipulation and Hygiene Supervision/safety;Sitting/lateral lean  Functional mobility during ADLs Min guard;Cueing for safety  General ADL Comments educate patient on pursed lip and diaphragmatic breathing techniques as patient begins wheezing with minimal activity, patient verbalize understanding and requires mod cues for proper technique  Bed Mobility  Overal bed mobility Modified Independent  Transfers  Overall transfer level Needs assistance  Equipment used None  Transfers Sit to/from Stand  Sit to Stand Supervision  General transfer comment patient does not require physical assistance with sit to stand, patient utilize IV pole intermittently for stability with ambulation in room  General Comments  General comments (skin integrity, edema, etc.) patient BP seated EOB after sink side grooming and toileting 83/66 patient asypmtomatic  OT - End of Session  Equipment Utilized During Treatment Oxygen  Activity Tolerance Patient tolerated treatment well  Patient left in chair;with call bell/phone within reach;with family/visitor present;with chair alarm set  Nurse Communication Mobility status  OT Assessment/Plan  OT Plan Discharge plan remains appropriate  OT Visit Diagnosis Unsteadiness on feet (R26.81);Muscle weakness (generalized) (M62.81);Other abnormalities of gait and mobility (R26.89)  OT Frequency (ACUTE ONLY) Min 2X/week  Follow Up Recommendations Supervision - Intermittent  OT Equipment Shower chair  AM-PAC OT "6 Clicks" Daily Activity Outcome Measure (Version 2)  Help from another person eating meals? 4  Help from another person taking care of personal grooming? 4  Help from another person toileting, which includes using toliet, bedpan, or  urinal? 3  Help from another person bathing (including washing, rinsing, drying)? 3  Help from another person to put on  and taking off regular upper body clothing? 3  Help from another person to put on and taking off regular lower body clothing? 3  6 Click Score 20  OT Goal Progression  Progress towards OT goals Progressing toward goals  Acute Rehab OT Goals  Patient Stated Goal home  OT Goal Formulation With patient  Time For Goal Achievement 06/05/20  Potential to Achieve Goals Good  ADL Goals  Pt Will Transfer to Toilet with modified independence  Pt Will Perform Toileting - Clothing Manipulation and hygiene with modified independence  Pt Will Perform Tub/Shower Transfer with supervision;ambulating  Additional ADL Goal #1 pt will apply and verbalize energy conservation strategies to ADL activity at mod I level  OT Time Calculation  OT Start Time (ACUTE ONLY) 0940  OT Stop Time (ACUTE ONLY) 1011  OT Time Calculation (min) 31 min  OT General Charges  $OT Visit 1 Visit  OT Treatments  $Self Care/Home Management  23-37 mins   Delbert Phenix OT OT pager: [phone]

## 2020-05-26 NOTE — Progress Notes (Signed)
   LHC reviewed - no significant CAD noted. Per Dr. Margaretann Loveless 05/25/20, no further cardiac work-up recommended at this time. Post-cath precautions reviewed and updated in AVS.   [facility] will sign off.    Medication Recommendations:  Continue current medications in [facility] Other recommendations (labs, testing, etc):  none Follow up as an outpatient: Outpatient follow-up has been arranged and updated in AVS.   Abigail Butts, PA-C 05/26/20; 1:32 PM

## 2020-05-26 NOTE — Progress Notes (Signed)
PROGRESS NOTE    [name]  KWI:[id number] DOB: 1946/01/09 DOA: 05/20/2020 PCP: [place], [name], MD   Brief Narrative:  Patient is a 74 year old female with history of COPD on 2 L of oxygen at home, peripheral vascular disease, hypothyroidism, hypertension, hyperlipidemia, diverticulosis, coronary artery  disease who presents to the emergency department complaints of dyspnea on exertion. Patient reported dyspnea over the past year and has been on multiple rounds of steroids/antibiotics.  She was also significantly stressed and on grief due to her son passing away in September 2020.  Patient was suspected to have COPD exacerbation.  She was started on steroids, antibiotic.  PCCM is also following.  Echocardiogram done on this admission showed reduced ejection fraction.  Cardiology consulted.  Underwent cardiac cath which did not show any significant coronary artery disease.  Assessment & Plan:   Active Problems:   Hypothyroidism   Hyperlipidemia   Essential hypertension   GERD   Chronic mesenteric ischemia (HCC)   CAD (coronary artery disease)   Breast cancer, right breast (HCC)   COPD with acute exacerbation (HCC)   COPD exacerbation (HCC)   Acute on chronic respiratory failure with hypoxia (HCC)   Acute systolic CHF (congestive heart failure) (HCC)   Major depressive disorder, single episode, moderate degree (HCC)   Shortness of breath   Acute on chronic respiratory failure with hypoxia: Multifactorial from COPD exacerbation and possible systolic congestive heart failure.  She was given few doses of Lasix.  She was treated with IV steroids which has been changed to oral now. She again developed  rhonchi and wheezes today and was short of breath so steroids changed to IV. Cardiology and PCCM following.  COPD exacerbation: Follows with pulmonology as an outpatient.  Takes trelegy at home.  Continue Singulair.  Pulmonology following here.  She was treated with  Levaquin for COPD  exacerbation treatment.  Continue bronchodilators as needed.  Continue supplemental oxygen as needed.  She is on 2 L of oxygen at home.  Resumed IV steroids for severe wheezing and rhonchi.  Systolic congestive heart failure: Echocardiogram done on this admission showed reduced ejection fraction to 40 to 45%, left ventricle global hypokinesis.  Left heart catheter showed ejection fraction of 65%, hyperdynamic left ventricle.  Chest x-ray showed mild pulmonary edema.  She was given few days of Lasix.  Troponins have been negative.  Cardiac cath did not show any significant coronary disease.  Continue to monitor input/output, daily weight  Coronary artery disease: Continue aspirin, Lipitor.  Denies any chest pain.  Chronic mesenteric ischemia: Continue Plavix, Pletal, Lipitor  Hypothyroidism: Continue Synthyroid  Hyperlipidemia: Continue statin  Hypertension: Currently blood pressure stable.  Continue current regimen  GERD: Continue PPI  Major depressive order/grief: Patient significantly depressed, stressed due to her son passing away on June 25, 2019.  Reported lack of sleep.  Start on Paxil.          Nutrition Problem: Increased nutrient needs Etiology: acute illness, chronic illness      DVT prophylaxis:Lovenox Code Status: Full Family Communication: Husband at the bedside on 05/26/20 Status is: Inpatient  Remains inpatient appropriate because:Ongoing diagnostic testing needed not appropriate for outpatient work up   Dispo: The patient is from: Home              Anticipated d/c is to: Home              Anticipated d/c date is: 1-2 days  Patient currently is not medically stable to d/c. Still has significant wheezes and rhonchi.  Not ready for discharge.    Consultants: Cardiology,PCCM  Procedures:None  Antimicrobials:  Anti-infectives (From admission, onward)   Start     Dose/Rate Route Frequency Ordered Stop   05/22/20 0800  levofloxacin  (LEVAQUIN) tablet 750 mg  Status:  Discontinued        750 mg Oral Daily 05/21/20 1323 05/25/20 1334   05/22/20 0730  azithromycin (ZITHROMAX) tablet 500 mg  Status:  Discontinued       "Followed by" Linked Group Details   500 mg Oral Daily 05/21/20 0726 05/21/20 0841   05/21/20 1000  levofloxacin (LEVAQUIN) IVPB 500 mg  Status:  Discontinued        500 mg 100 mL/hr over 60 Minutes Intravenous Every 24 hours 05/21/20 0841 05/21/20 1323   05/21/20 0726  azithromycin (ZITHROMAX) 500 mg in sodium chloride 0.9 % 250 mL IVPB  Status:  Discontinued       "Followed by" Linked Group Details   500 mg 250 mL/hr over 60 Minutes Intravenous Every 24 hours 05/21/20 0726 05/21/20 0841      Subjective: Patient seen and examined the bedside this morning.  She was severely short of breath today.  He was wheezing and had rhonchi.  Also has cough. Objective: Vitals:   05/25/20 2145 05/26/20 0232 05/26/20 0541 05/26/20 0733  BP: 103/72  140/87   Pulse: 70  71   Resp: 20  20   Temp: 97.8 F (36.6 C)  98.1 F (36.7 C)   TempSrc: Oral  Oral   SpO2: 90% 91% 92% 92%  Weight:      Height:        Intake/Output Summary (Last 24 hours) at 05/26/2020 1142 Last data filed at 05/26/2020 0600 Gross per 24 hour  Intake 180 ml  Output 0 ml  Net 180 ml   Filed Weights   05/20/20 1622  Weight: 61 kg    Examination:  General exam: Short of breath, coughing HEENT:PERRL,Oral mucosa moist, Ear/Nose normal on gross exam Respiratory system: Bilateral rhonchi, wheezes, decreased air entry  cardiovascular system: S1 & S2 heard, RRR. No JVD, murmurs, rubs, gallops or clicks. Gastrointestinal system: Abdomen is nondistended, soft and nontender. No organomegaly or masses felt. Normal bowel sounds heard. Central nervous system: Alert and oriented. No focal neurological deficits. Extremities: No edema, no clubbing ,no cyanosis Skin: No rashes, lesions or ulcers,no icterus ,no pallor   Data Reviewed: I have  personally reviewed following labs and imaging studies  CBC: Recent Labs  Lab 05/22/20 0540 05/23/20 0506 05/24/20 0521 05/25/20 0512 05/26/20 0533  WBC 8.6 7.9 7.7 7.4 9.7  NEUTROABS 6.8 5.0 5.2 4.8 6.8  HGB 14.3 15.3* 14.6 14.8 13.9  HCT 43.3 47.4* 45.1 44.1 43.2  MCV 93.1 92.8 93.0 91.1 94.1  PLT 409* 412* 389 365 622   Basic Metabolic Panel: Recent Labs  Lab 05/21/20 0919 05/21/20 0919 05/22/20 0540 05/23/20 0506 05/24/20 0521 05/25/20 0512 05/26/20 0533  NA 138   < > 136 130* 134* 136 133*  K 4.1   < > 4.2 3.7 3.8 4.1 4.0  CL 100   < > 98 91* 98 100 101  CO2 28   < > 28 28 28 28 28   GLUCOSE 156*   < > 141* 96 96 89 94  BUN 15   < > 27* 21 17 13 14   CREATININE 0.74   < > 0.83 0.87  0.76 0.73 0.65  CALCIUM 9.5   < > 9.2 8.9 8.8* 8.7* 8.3*  MG 2.1   < > 2.4 2.2 2.1 2.0 1.9  PHOS 3.6  --   --   --   --   --   --    < > = values in this interval not displayed.   GFR: Estimated Creatinine Clearance: 56.9 mL/min (by C-G formula based on SCr of 0.65 mg/dL). Liver Function Tests: Recent Labs  Lab 05/21/20 0919  AST 29  ALT 32  ALKPHOS 72  BILITOT 0.3  PROT 8.0  ALBUMIN 4.2   No results for input(s): LIPASE, AMYLASE in the last 168 hours. No results for input(s): AMMONIA in the last 168 hours. Coagulation Profile: No results for input(s): INR, PROTIME in the last 168 hours. Cardiac Enzymes: No results for input(s): CKTOTAL, CKMB, CKMBINDEX, TROPONINI in the last 168 hours. BNP (last 3 results) No results for input(s): PROBNP in the last 8760 hours. HbA1C: No results for input(s): HGBA1C in the last 72 hours. CBG: No results for input(s): GLUCAP in the last 168 hours. Lipid Profile: No results for input(s): CHOL, HDL, LDLCALC, TRIG, CHOLHDL, LDLDIRECT in the last 72 hours. Thyroid Function Tests: No results for input(s): TSH, T4TOTAL, FREET4, T3FREE, THYROIDAB in the last 72 hours. Anemia Panel: No results for input(s): VITAMINB12, FOLATE, FERRITIN, TIBC,  IRON, RETICCTPCT in the last 72 hours. Sepsis Labs: No results for input(s): PROCALCITON, LATICACIDVEN in the last 168 hours.  Recent Results (from the past 240 hour(s))  SARS Coronavirus 2 by RT PCR (hospital order, performed in [facility] hospital lab) Nasopharyngeal Nasopharyngeal Swab     Status: None   Collection Time: 05/20/20  4:57 PM   Specimen: Nasopharyngeal Swab  Result Value Ref Range Status   SARS Coronavirus 2 NEGATIVE NEGATIVE Final    Comment: (NOTE) SARS-CoV-2 target nucleic acids are NOT DETECTED.  The SARS-CoV-2 RNA is generally detectable in upper and lower respiratory specimens during the acute phase of infection. The lowest concentration of SARS-CoV-2 viral copies this assay can detect is 250 copies / mL. A negative result does not preclude SARS-CoV-2 infection and should not be used as the sole basis for treatment or other patient management decisions.  A negative result may occur with improper specimen collection / handling, submission of specimen other than nasopharyngeal swab, presence of viral mutation(s) within the areas targeted by this assay, and inadequate number of viral copies (<250 copies / mL). A negative result must be combined with clinical observations, patient history, and epidemiological information.  Fact Sheet for Patients:   StrictlyIdeas.no  Fact Sheet for Healthcare Providers: BankingDealers.co.za  This test is not yet approved or  cleared by the [place] FDA and has been authorized for detection and/or diagnosis of SARS-CoV-2 by FDA under an Emergency Use Authorization (EUA).  This EUA will remain in effect (meaning this test can be used) for the duration of the COVID-19 declaration under Section 564(b)(1) of the Act, 21 U.S.C. section 360bbb-3(b)(1), unless the authorization is terminated or revoked sooner.  Performed at [facility], [place] [address], [place], [place] [address]   Respiratory Panel by PCR     Status: None   Collection Time: 05/21/20  7:27 AM   Specimen: Nasopharyngeal Swab; Respiratory  Result Value Ref Range Status   Adenovirus NOT DETECTED NOT DETECTED Final   Coronavirus 229E NOT DETECTED NOT DETECTED Final    Comment: (NOTE) The Coronavirus on the Respiratory Panel,  DOES NOT test for the novel  Coronavirus (2019 nCoV)    Coronavirus HKU1 NOT DETECTED NOT DETECTED Final   Coronavirus NL63 NOT DETECTED NOT DETECTED Final   Coronavirus OC43 NOT DETECTED NOT DETECTED Final   Metapneumovirus NOT DETECTED NOT DETECTED Final   Rhinovirus / Enterovirus NOT DETECTED NOT DETECTED Final   Influenza A NOT DETECTED NOT DETECTED Final   Influenza B NOT DETECTED NOT DETECTED Final   Parainfluenza Virus 1 NOT DETECTED NOT DETECTED Final   Parainfluenza Virus 2 NOT DETECTED NOT DETECTED Final   Parainfluenza Virus 3 NOT DETECTED NOT DETECTED Final   Parainfluenza Virus 4 NOT DETECTED NOT DETECTED Final   Respiratory Syncytial Virus NOT DETECTED NOT DETECTED Final   Bordetella pertussis NOT DETECTED NOT DETECTED Final   Chlamydophila pneumoniae NOT DETECTED NOT DETECTED Final   Mycoplasma pneumoniae NOT DETECTED NOT DETECTED Final    Comment: Performed at [facility], [place] [address], [place], [place] [address]         Radiology Studies: CARDIAC CATHETERIZATION  Result Date: 05/25/2020 No significant coronary obstructive disease.  The left main is short and immediately trifurcates into a large LAD, ramus immediate and left circumflex vessel.  The midportion of the LAD dips intramyocardially slightly but there is no evidence for systolic bridging.  The RCA is a dominant vessel that has calcification without obstructive stenosis. Hyperdynamic LV function with EF estimated at 65%.  LVEDP 10 mmHg. RECOMMENDATION: Medical therapy.       Scheduled Meds: . aspirin EC  81 mg Oral Daily  . atorvastatin  80 mg Oral  Daily  . budesonide (PULMICORT) nebulizer solution  0.25 mg Nebulization BID  . cilostazol  100 mg Oral BID  . dextromethorphan-guaiFENesin  2 tablet Oral BID  . docusate sodium  100 mg Oral BID  . enoxaparin (LOVENOX) injection  40 mg Subcutaneous Q24H  . feeding supplement (ENSURE ENLIVE)  237 mL Oral Q24H  . ipratropium-albuterol  3 mL Nebulization Q6H  . levothyroxine  112 mcg Oral Q0600  . LORazepam  2 mg Oral QHS  . methylPREDNISolone (SOLU-MEDROL) injection  40 mg Intravenous Q24H  . montelukast  10 mg Oral QHS  . pantoprazole  40 mg Oral Daily  . PARoxetine  20 mg Oral QHS  . sodium chloride flush  3 mL Intravenous Q12H  . sodium chloride flush  3 mL Intravenous Q12H  . sodium chloride flush  3 mL Intravenous Q12H  . sodium chloride flush  3 mL Intravenous Q12H   Continuous Infusions: . sodium chloride       LOS: 6 days    Time spent: 35 mins.More than 50% of that time was spent in counseling and/or coordination of care.      Shelly Coss, MD [facility] P8/09/2020, 11:42 AM

## 2020-05-26 NOTE — Care Management Important Message (Signed)
Important Message  Patient Details IM Letter given to the Patient Name: [name] MRN: [id number] Date of Birth: 1946/01/23   Medicare Important Message Given:  Yes     Kerin Salen 05/26/2020, 10:38 AM

## 2020-05-26 NOTE — Telephone Encounter (Signed)
    Pt is scheduled for TOC with Truitt Merle on 06/08/2020. Scheduled by Roby Lofts

## 2020-05-27 ENCOUNTER — Ambulatory Visit: Payer: Medicare Other | Admitting: Cardiology

## 2020-05-27 ENCOUNTER — Inpatient Hospital Stay (HOSPITAL_COMMUNITY): Payer: Medicare Other

## 2020-05-27 MED ORDER — METOCLOPRAMIDE HCL 5 MG PO TABS
5.0000 mg | ORAL_TABLET | Freq: Three times a day (TID) | ORAL | Status: DC
  Administered 2020-05-27 – 2020-05-29 (×7): 5 mg via ORAL
  Filled 2020-05-27 (×7): qty 1

## 2020-05-27 NOTE — Progress Notes (Signed)
PROGRESS NOTE    [name]  JHE:[id number] DOB: April 24, 1946 DOA: 05/20/2020 PCP: [place], [name], MD   Brief Narrative:  Patient is a 74 year old female with history of COPD on 2 L of oxygen at home, peripheral vascular disease, hypothyroidism, hypertension, hyperlipidemia, diverticulosis, coronary artery  disease who presents to the emergency department complaints of dyspnea on exertion. Patient reported dyspnea over the past year and has been on multiple rounds of steroids/antibiotics.  She was also significantly stressed and on grief due to her son passing away in September 2020.  Patient was suspected to have COPD exacerbation.  She was started on steroids, antibiotic.  PCCM is also following.  Echocardiogram done on this admission showed reduced ejection fraction.  Cardiology consulted.  Underwent cardiac cath which did not show any significant coronary artery disease.  Hospital course remarkable for persistent wheezing, rhonchi  Assessment & Plan:   Active Problems:   Hypothyroidism   Hyperlipidemia   Essential hypertension   GERD   Chronic mesenteric ischemia (HCC)   CAD (coronary artery disease)   Breast cancer, right breast (HCC)   COPD with acute exacerbation (HCC)   COPD exacerbation (HCC)   Acute on chronic respiratory failure with hypoxia (HCC)   Acute systolic CHF (congestive heart failure) (HCC)   Major depressive disorder, single episode, moderate degree (HCC)   Shortness of breath   Acute on chronic respiratory failure with hypoxia: Multifactorial from COPD exacerbation and possible systolic congestive heart failure.  She was given few doses of Lasix.  Currently on 2-3 L of oxygen per minute which is her baseline.  COPD exacerbation: Follows with pulmonology as an outpatient.  Takes trelegy at home.  Continue Singulair.  Pulmonology following here.  She was treated with  Levaquin for COPD exacerbation treatment.  Continue bronchodilators as needed.  Continue  supplemental oxygen as needed.  She is on 2 L of oxygen at home.  Resumed IV steroids for severe wheezing and rhonchi. Since her wheezing could be contributed by laryngeal pharyngeal reflux, PCCM recommended esophagram.  Esophagram showed small to moderate size hiatal hernia, esophageal dysmotility.  I have requested for GI consultation.  Continue PPI bid  Systolic congestive heart failure: Echocardiogram done on this admission showed reduced ejection fraction to 40 to 45%, left ventricle global hypokinesis.  Left heart catheter showed ejection fraction of 65%, hyperdynamic left ventricle.  Chest x-ray showed mild pulmonary edema.  She was given few days of Lasix.  Troponins have been negative.  Cardiac cath did not show any significant coronary disease.  Continue to monitor input/output, daily weight.  We will hold on putting her on diuretics.  Coronary artery disease: Continue aspirin, Lipitor.  Denies any chest pain.  Chronic mesenteric ischemia: Continue Plavix, Pletal, Lipitor  Hypothyroidism: Continue Synthyroid  Hyperlipidemia: Continue statin  Hypertension: Currently blood pressure stable.  Continue current regimen  GERD: Continue PPI  Major depressive order/grief: Patient significantly depressed, stressed due to her son passing away on June 25, 2019.  Reported lack of sleep.  Start on Paxil.          Nutrition Problem: Increased nutrient needs Etiology: acute illness, chronic illness      DVT prophylaxis:Lovenox Code Status: Full Family Communication: Husband at the bedside on 05/26/20 Status is: Inpatient  Remains inpatient appropriate because:Ongoing diagnostic testing needed not appropriate for outpatient work up   Dispo: The patient is from: Home              Anticipated d/c is  to: Home              Anticipated d/c date is: 1-2 days              Patient currently is not medically stable to d/c. Still has significant wheezes and rhonchi.  Not ready for  discharge.    Consultants: Cardiology,PCCM  Procedures:None  Antimicrobials:  Anti-infectives (From admission, onward)   Start     Dose/Rate Route Frequency Ordered Stop   05/22/20 0800  levofloxacin (LEVAQUIN) tablet 750 mg  Status:  Discontinued        750 mg Oral Daily 05/21/20 1323 05/25/20 1334   05/22/20 0730  azithromycin (ZITHROMAX) tablet 500 mg  Status:  Discontinued       "Followed by" Linked Group Details   500 mg Oral Daily 05/21/20 0726 05/21/20 0841   05/21/20 1000  levofloxacin (LEVAQUIN) IVPB 500 mg  Status:  Discontinued        500 mg 100 mL/hr over 60 Minutes Intravenous Every 24 hours 05/21/20 0841 05/21/20 1323   05/21/20 0726  azithromycin (ZITHROMAX) 500 mg in sodium chloride 0.9 % 250 mL IVPB  Status:  Discontinued       "Followed by" Linked Group Details   500 mg 250 mL/hr over 60 Minutes Intravenous Every 24 hours 05/21/20 0726 05/21/20 0841      Subjective:  Patient seen and examined the bedside this morning.  Hemodynamically stable.  She is still coughing wheezing today.  On 2 to 3 L of oxygen per minute.  Underwent esophagram this morning.   Objective: Vitals:   05/26/20 1440 05/26/20 2057 05/27/20 0233 05/27/20 0744  BP:  130/85  113/81  Pulse:  77  73  Resp:  20  18  Temp:  97.8 F (36.6 C)  98.5 F (36.9 C)  TempSrc:  Oral  Oral  SpO2: 91% 92% (!) 88% 94%  Weight:      Height:       No intake or output data in the 24 hours ending 05/27/20 0750 Filed Weights   05/20/20 1622  Weight: 61 kg    Examination:  General exam: Coughing Respiratory system: Bilateral diffuse wheezes, rhonchi  cardiovascular system: S1 & S2 heard, RRR. No JVD, murmurs, rubs, gallops or clicks. Gastrointestinal system: Abdomen is nondistended, soft and nontender. No organomegaly or masses felt. Normal bowel sounds heard. Central nervous system: Alert and oriented. No focal neurological deficits. Extremities: No edema, no clubbing ,no cyanosis Skin: No  rashes, lesions or ulcers,no icterus ,no pallor   Data Reviewed: I have personally reviewed following labs and imaging studies  CBC: Recent Labs  Lab 05/22/20 0540 05/23/20 0506 05/24/20 0521 05/25/20 0512 05/26/20 0533  WBC 8.6 7.9 7.7 7.4 9.7  NEUTROABS 6.8 5.0 5.2 4.8 6.8  HGB 14.3 15.3* 14.6 14.8 13.9  HCT 43.3 47.4* 45.1 44.1 43.2  MCV 93.1 92.8 93.0 91.1 94.1  PLT 409* 412* 389 365 478   Basic Metabolic Panel: Recent Labs  Lab 05/21/20 0919 05/21/20 0919 05/22/20 0540 05/23/20 0506 05/24/20 0521 05/25/20 0512 05/26/20 0533  NA 138   < > 136 130* 134* 136 133*  K 4.1   < > 4.2 3.7 3.8 4.1 4.0  CL 100   < > 98 91* 98 100 101  CO2 28   < > 28 28 28 28 28   GLUCOSE 156*   < > 141* 96 96 89 94  BUN 15   < > 27* 21 17 13  14  CREATININE 0.74   < > 0.83 0.87 0.76 0.73 0.65  CALCIUM 9.5   < > 9.2 8.9 8.8* 8.7* 8.3*  MG 2.1   < > 2.4 2.2 2.1 2.0 1.9  PHOS 3.6  --   --   --   --   --   --    < > = values in this interval not displayed.   GFR: Estimated Creatinine Clearance: 56.9 mL/min (by C-G formula based on SCr of 0.65 mg/dL). Liver Function Tests: Recent Labs  Lab 05/21/20 0919  AST 29  ALT 32  ALKPHOS 72  BILITOT 0.3  PROT 8.0  ALBUMIN 4.2   No results for input(s): LIPASE, AMYLASE in the last 168 hours. No results for input(s): AMMONIA in the last 168 hours. Coagulation Profile: No results for input(s): INR, PROTIME in the last 168 hours. Cardiac Enzymes: No results for input(s): CKTOTAL, CKMB, CKMBINDEX, TROPONINI in the last 168 hours. BNP (last 3 results) No results for input(s): PROBNP in the last 8760 hours. HbA1C: No results for input(s): HGBA1C in the last 72 hours. CBG: No results for input(s): GLUCAP in the last 168 hours. Lipid Profile: No results for input(s): CHOL, HDL, LDLCALC, TRIG, CHOLHDL, LDLDIRECT in the last 72 hours. Thyroid Function Tests: No results for input(s): TSH, T4TOTAL, FREET4, T3FREE, THYROIDAB in the last 72  hours. Anemia Panel: No results for input(s): VITAMINB12, FOLATE, FERRITIN, TIBC, IRON, RETICCTPCT in the last 72 hours. Sepsis Labs: No results for input(s): PROCALCITON, LATICACIDVEN in the last 168 hours.  Recent Results (from the past 240 hour(s))  SARS Coronavirus 2 by RT PCR (hospital order, performed in [facility] hospital lab) Nasopharyngeal Nasopharyngeal Swab     Status: None   Collection Time: 05/20/20  4:57 PM   Specimen: Nasopharyngeal Swab  Result Value Ref Range Status   SARS Coronavirus 2 NEGATIVE NEGATIVE Final    Comment: (NOTE) SARS-CoV-2 target nucleic acids are NOT DETECTED.  The SARS-CoV-2 RNA is generally detectable in upper and lower respiratory specimens during the acute phase of infection. The lowest concentration of SARS-CoV-2 viral copies this assay can detect is 250 copies / mL. A negative result does not preclude SARS-CoV-2 infection and should not be used as the sole basis for treatment or other patient management decisions.  A negative result may occur with improper specimen collection / handling, submission of specimen other than nasopharyngeal swab, presence of viral mutation(s) within the areas targeted by this assay, and inadequate number of viral copies (<250 copies / mL). A negative result must be combined with clinical observations, patient history, and epidemiological information.  Fact Sheet for Patients:   StrictlyIdeas.no  Fact Sheet for Healthcare Providers: BankingDealers.co.za  This test is not yet approved or  cleared by the [place] FDA and has been authorized for detection and/or diagnosis of SARS-CoV-2 by FDA under an Emergency Use Authorization (EUA).  This EUA will remain in effect (meaning this test can be used) for the duration of the COVID-19 declaration under Section 564(b)(1) of the Act, 21 U.S.C. section 360bbb-3(b)(1), unless the authorization is terminated or revoked  sooner.  Performed at [facility], [place] [address], [place], [place] [address]   Respiratory Panel by PCR     Status: None   Collection Time: 05/21/20  7:27 AM   Specimen: Nasopharyngeal Swab; Respiratory  Result Value Ref Range Status   Adenovirus NOT DETECTED NOT DETECTED Final   Coronavirus 229E NOT DETECTED NOT DETECTED Final  Comment: (NOTE) The Coronavirus on the Respiratory Panel, DOES NOT test for the novel  Coronavirus (2019 nCoV)    Coronavirus HKU1 NOT DETECTED NOT DETECTED Final   Coronavirus NL63 NOT DETECTED NOT DETECTED Final   Coronavirus OC43 NOT DETECTED NOT DETECTED Final   Metapneumovirus NOT DETECTED NOT DETECTED Final   Rhinovirus / Enterovirus NOT DETECTED NOT DETECTED Final   Influenza A NOT DETECTED NOT DETECTED Final   Influenza B NOT DETECTED NOT DETECTED Final   Parainfluenza Virus 1 NOT DETECTED NOT DETECTED Final   Parainfluenza Virus 2 NOT DETECTED NOT DETECTED Final   Parainfluenza Virus 3 NOT DETECTED NOT DETECTED Final   Parainfluenza Virus 4 NOT DETECTED NOT DETECTED Final   Respiratory Syncytial Virus NOT DETECTED NOT DETECTED Final   Bordetella pertussis NOT DETECTED NOT DETECTED Final   Chlamydophila pneumoniae NOT DETECTED NOT DETECTED Final   Mycoplasma pneumoniae NOT DETECTED NOT DETECTED Final    Comment: Performed at [facility], [place] [address], [place], [place] [address]         Radiology Studies: DG Chest 1 View  Result Date: 05/26/2020 CLINICAL DATA:  Shortness of breath and chest congestion. Cardiac catheterization yesterday. EXAM: CHEST  1 VIEW COMPARISON:  05/20/2020 FINDINGS: Mild cardiomegaly. Mild aortic atherosclerotic calcification. Emphysema and pulmonary scarring. Postsurgical changes in the right upper lobe. No sign of acute infiltrate, mass, effusion or collapse. Evidence of pulmonary edema. IMPRESSION: No acute disease. Emphysema and pulmonary scarring. Postsurgical changes in the right  upper lobe. Electronically Signed   By: Nelson Chimes M.D.   On: 05/26/2020 12:52   CARDIAC CATHETERIZATION  Result Date: 05/25/2020 No significant coronary obstructive disease.  The left main is short and immediately trifurcates into a large LAD, ramus immediate and left circumflex vessel.  The midportion of the LAD dips intramyocardially slightly but there is no evidence for systolic bridging.  The RCA is a dominant vessel that has calcification without obstructive stenosis. Hyperdynamic LV function with EF estimated at 65%.  LVEDP 10 mmHg. RECOMMENDATION: Medical therapy.       Scheduled Meds: . aspirin EC  81 mg Oral Daily  . atorvastatin  80 mg Oral Daily  . budesonide (PULMICORT) nebulizer solution  0.25 mg Nebulization BID  . cilostazol  100 mg Oral BID  . dextromethorphan-guaiFENesin  2 tablet Oral BID  . docusate sodium  100 mg Oral BID  . enoxaparin (LOVENOX) injection  40 mg Subcutaneous Q24H  . famotidine  40 mg Oral QHS  . feeding supplement (ENSURE ENLIVE)  237 mL Oral Q24H  . fluticasone  1 spray Each Nare BID  . ipratropium-albuterol  3 mL Nebulization Q6H  . levothyroxine  112 mcg Oral Q0600  . LORazepam  2 mg Oral QHS  . methylPREDNISolone (SOLU-MEDROL) injection  40 mg Intravenous Q24H  . montelukast  10 mg Oral QHS  . pantoprazole  40 mg Oral BID  . PARoxetine  20 mg Oral QHS  . sodium chloride flush  3 mL Intravenous Q12H  . sodium chloride flush  3 mL Intravenous Q12H  . sodium chloride flush  3 mL Intravenous Q12H  . sodium chloride flush  3 mL Intravenous Q12H   Continuous Infusions: . sodium chloride       LOS: 7 days    Time spent: 35 mins.More than 50% of that time was spent in counseling and/or coordination of care.      Shelly Coss, MD [facility] P8/13/2021, 7:50 AM

## 2020-05-27 NOTE — Progress Notes (Signed)
NAME:  [name], MRN:  [id number], DOB:  1946-05-08, LOS: 7 ADMISSION DATE:  05/20/2020, CONSULTATION DATE:  05/21/2020 REFERRING MD:  [facility], CHIEF COMPLAINT: Shortness of breath  Brief History   74 year old female with extensive past medical history is followed by Dr. Chase Caller has not felt well since September 2020. Frequent exacerbations of respiratory treatment with multiple antimicrobial steroids O2 dependent at 2 L nocturnal oxygen today. Reports have increased wheezing shortness of breath denies chest pain. She has a cough that is nonproductive. She presented multiple times in the office and there was to be seen on 05/21/2019 office unfortunately the office while. Patient presented with emergency department for further evaluation and treatment. She was admitted by [facility] hospital[facility] team pulmonary critical care was asked to evaluate 05/21/2020.  Past Medical History   Past Medical History:  Diagnosis Date  . Bilateral leg cramps   . Bladder neoplasm   . Chronic deep vein thrombosis (DVT) of right lower extremity ([place])    05/ 2018  chronic non-occlusive DVT RLE  . COPD, frequent exacerbations ([place])    03-06-2018 per pt last exacerbation 12/ 2018  . Coronary artery disease    cardiologist-  dr Aundra Dubin-- 03-11-2018 er cath , 80% stenosis in the ostial second diagonal  . Dyspnea on minimal exertion   . Emphysema/COPD ([place])    CAT score- 17  . Family history of breast cancer   . Family history of colon cancer   . Family history of melanoma   . Family history of ovarian cancer   . Family history of pancreatic cancer   . Fibromyalgia 1995  . GERD (gastroesophageal reflux disease)   . Hiatal hernia   . History of breast cancer   . History of diverticulitis of colon    2002  s/p  sigmoid colectomy  . History of multiple pulmonary nodules    hx RUL nodules x2  s/p right VATS w/ wedge resection 09-11-2005 and 06-14-2011  both  necrotizing granulomatous inflammation w/ cystic area of  necrosis & focal calcification  . History of right breast cancer oncologist-  dr Jana Hakim-- no recurrence   dx 2010--  IDC, Stage IA , ER/PR+,  (pT1c pN0) 11-09-2008 right lumpectomy;  12-18-2008 right simple mastectomy for DCIS margins;  completed chemotherapy 2010 (no radiation) and completed antiestrogen therapy  . Hx of colonic polyps    Dr. Cristina Gong -last study '11  . Hyperlipidemia   . Hypertension   . Hypothyroidism   . 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 Nocturia   . OA (osteoarthritis)    thumbs  . On supplemental oxygen therapy    03-06-2018 per pt uses only at night,  checks O2 sats at home,  stated am sat 89% after moving around average 93-94% with RA  . Osteoporosis   . Peripheral arterial occlusive disease [facility]) vascular-- dr chen/ dr Donzetta Matters   proximal right SFA severe focal stenosis with collaterals from the PFA/  04/ 2012 occluded celiac and SMA arteries with distal reconstitution w/ patent IMA  . Peripheral vascular disease ([place])    chronic DVT RLE,  mesenteric vascular disease  . Right lower lobe pulmonary nodule    Chest CT 08-29-2017  . Stage 3 severe COPD by GOLD classification ([place]) hx frequent exacerbations--   pulmologist-  dr Maryann Alar--  per lov note , dated 12-20-2017, oxyogen 2L is prescribed for use with exertion (but pt only uses mostly at night), O2 sats  on RA run the 80s , this day sat 86% RA and with 2L O2 sat 92%  . Varicose veins of leg with swelling    varicose vein surgery - Dr. Aleda Grana  . Wears glasses   . Wears hearing aid in both ears    Significant Hospital Events    Consults:  Pulmonary 05/21/2020  Procedures:  None  Significant Diagnostic Tests:  Echocardiogram: Esophagram there is significant impaired secondary peristalsis with prolonged pooling of barium in the upper esophagus in prone RAO position.  Mild intermittent reverse peristalsis in the mid and distal esophagus.  Small to moderate sized  sliding hiatal hernia.  No gastric reflux identified  Micro Data:    Antimicrobials:   Levaquin8/7 >  Interim history/subjective:  Feels about the same Objective   Blood pressure 119/76, pulse 81, temperature 98.1 F (36.7 C), temperature source Oral, resp. rate 18, height 5\' 6"  (1.676 m), weight 61 kg, SpO2 92 %.        Intake/Output Summary (Last 24 hours) at 05/27/2020 1604 Last data filed at 05/27/2020 1500 Gross per 24 hour  Intake 480 ml  Output no documentation  Net 480 ml   Filed Weights   05/20/20 1622  Weight: 61 kg    Examination: General chronically ill 74 year old white female resting in the chair she is in no acute distress HEENT normocephalic atraumatic mucous membranes moist still has prominent upper airway wheezing Pulmonary essentially clear some faint expiratory wheezing but mostly generated from upper airway Cardiac regular rate and rhythm Abdomen soft nontender Extremities are warm and dry Neuro intact  Resolved Hospital Problem list     Assessment & Plan:  Acute on chronic hypoxemic respiratory failure in the setting of COPD, initially secondary to pulmonary edema now more likely exacerbated by laryngal pharyngeal reflux -Esophagram did not capture active refluxing however she did demonstrate significant esophageal dysmotility with prolonged pooling of barium in the upper esophagus as well as reverse peristalsis in the mid and distal esophagus.  This would absolutely increase her risk of refluxing and intermittently aspirating Plan Okay to change systemic steroids back to oral  Continue PPI twice a day and at bedtime Pepcid  Continue nasal steroids, to help with postnasal drip  Supplemental oxygen  Reflux precautions, she needs to be sure she is upright for at least 2 hours before going to bed at night  We will add Reglan low-dose, 5 mg before meals and at bedtime  Continue supplemental oxygen   Decompensated systolic  heart failure (EF 40-45%)  w/ known CAD-->Cardiac cath 8/11:  No significant coronary obstructive disease. Hypertension Plan Per cardiology and primary team   Erick Colace ACNP-BC [place] Pager # [phone] OR # [phone] if no answer

## 2020-05-28 LAB — BASIC METABOLIC PANEL
Anion gap: 8 (ref 5–15)
BUN: 16 mg/dL (ref 8–23)
CO2: 26 mmol/L (ref 22–32)
Calcium: 8.2 mg/dL — ABNORMAL LOW (ref 8.9–10.3)
Chloride: 97 mmol/L — ABNORMAL LOW (ref 98–111)
Creatinine, Ser: 0.64 mg/dL (ref 0.44–1.00)
GFR calc Af Amer: >60 mL/min (ref >60)
GFR calc non Af Amer: >60 mL/min (ref >60)
Glucose, Bld: 95 mg/dL (ref 70–99)
Potassium: 3.9 mmol/L (ref 3.5–5.1)
Sodium: 131 mmol/L — ABNORMAL LOW (ref 135–145)

## 2020-05-28 LAB — CBC WITH DIFFERENTIAL/PLATELET
Abs Immature Granulocytes: 0.06 10*3/uL (ref 0.00–0.07)
Basophils Absolute: 0 10*3/uL (ref 0.0–0.1)
Basophils Relative: 0 %
Eosinophils Absolute: 0.1 10*3/uL (ref 0.0–0.5)
Eosinophils Relative: 1 %
HCT: 40.3 % (ref 36.0–46.0)
Hemoglobin: 13.4 g/dL (ref 12.0–15.0)
Immature Granulocytes: 1 %
Lymphocytes Relative: 12 %
Lymphs Abs: 1 10*3/uL (ref 0.7–4.0)
MCH: 30.7 pg (ref 26.0–34.0)
MCHC: 33.3 g/dL (ref 30.0–36.0)
MCV: 92.4 fL (ref 80.0–100.0)
Monocytes Absolute: 1 10*3/uL (ref 0.1–1.0)
Monocytes Relative: 11 %
Neutro Abs: 6.7 10*3/uL (ref 1.7–7.7)
Neutrophils Relative %: 75 %
Platelets: 300 10*3/uL (ref 150–400)
RBC: 4.36 MIL/uL (ref 3.87–5.11)
RDW: 13.3 % (ref 11.5–15.5)
WBC: 8.9 10*3/uL (ref 4.0–10.5)
nRBC: 0 % (ref 0.0–0.2)

## 2020-05-28 MED ORDER — PREDNISONE 20 MG PO TABS
40.0000 mg | ORAL_TABLET | Freq: Every day | ORAL | Status: DC
  Administered 2020-05-28 – 2020-05-29 (×2): 40 mg via ORAL
  Filled 2020-05-28 (×2): qty 2

## 2020-05-28 NOTE — Progress Notes (Signed)
PROGRESS NOTE    [name]  MWU:[id number] DOB: 1945-11-21 DOA: 05/20/2020 PCP: [place], [name], MD   Brief Narrative:  Patient is a 74 year old female with history of COPD on 2 L of oxygen at home, peripheral vascular disease, hypothyroidism, hypertension, hyperlipidemia, diverticulosis, coronary artery  disease who presents to the emergency department complaints of dyspnea on exertion. Patient reported dyspnea over the past year and has been on multiple rounds of steroids/antibiotics.  She was also significantly stressed and on grief due to her son passing away in September 2020.  Patient was suspected to have COPD exacerbation.  She was started on steroids, antibiotic.  PCCM is also following.  Echocardiogram done on this admission showed reduced ejection fraction.  Cardiology consulted.  Underwent cardiac cath which did not show any significant coronary artery disease.  Hospital course remarkable for persistent wheezing, rhonchi.  Esophagram showed esophageal dysmotility.  Started on PPI and H2 blocker.  Assessment & Plan:   Active Problems:   Hypothyroidism   Hyperlipidemia   Essential hypertension   GERD   Chronic mesenteric ischemia (HCC)   CAD (coronary artery disease)   Breast cancer, right breast (HCC)   COPD with acute exacerbation (HCC)   COPD exacerbation (HCC)   Acute on chronic respiratory failure with hypoxia (HCC)   Acute systolic CHF (congestive heart failure) (HCC)   Major depressive disorder, single episode, moderate degree (HCC)   Shortness of breath   Acute on chronic respiratory failure with hypoxia: Multifactorial from COPD exacerbation and possible systolic congestive heart failure.  She was given few doses of Lasix.  Currently on 2-3 L of oxygen per minute which is her baseline.  COPD exacerbation: Follows with pulmonology as an outpatient.  Takes trelegy at home.  Continue Singulair.  Pulmonology following here.  She was treated with  Levaquin for COPD  exacerbation treatment.  Continue bronchodilators as needed.  Continue supplemental oxygen as needed.  She is on 2 L of oxygen at home.  Resumed PO steroids for severe wheezing and rhonchi. Since her wheezing could be contributed by laryngeal pharyngeal reflux, PCCM recommended esophagram.  Esophagram showed small to moderate size hiatal hernia, esophageal dysmotility.Continue PPI bid, H2 blocker.  GI recommended outpatient follow-up.  Systolic congestive heart failure: Echocardiogram done on this admission showed reduced ejection fraction to 40 to 45%, left ventricle global hypokinesis.  Left heart catheter showed ejection fraction of 65%, hyperdynamic left ventricle.  Chest x-ray showed mild pulmonary edema.  She was given few days of Lasix.  Troponins have been negative.  Cardiac cath did not show any significant coronary disease.  Continue to monitor input/output, daily weight.  We will hold on putting her on diuretics.  Coronary artery disease: Continue aspirin, Lipitor.  Denies any chest pain.  Chronic mesenteric ischemia: Continue Plavix, Pletal, Lipitor  Hypothyroidism: Continue Synthyroid  Hyperlipidemia: Continue statin  Hypertension: Currently blood pressure stable.  Continue current regimen  GERD: Continue PPI  Major depressive order/grief: Patient significantly depressed, stressed due to her son passing away on June 25, 2019.  Reported lack of sleep.  Start on Paxil.          Nutrition Problem: Increased nutrient needs Etiology: acute illness, chronic illness      DVT prophylaxis:Lovenox Code Status: Full Family Communication: Husband at the bedside on 05/26/20 Status is: Inpatient  Remains inpatient appropriate because:Ongoing diagnostic testing needed not appropriate for outpatient work up   Dispo: The patient is from: Home  Anticipated d/c is to: Home              Anticipated d/c date is: tomorrow              Patient currently is not  medically stable to d/c. She definitely feels better today but still has significant wheezes and rhonchi and feels short of breath on minimal ambulation.      Consultants: Cardiology,PCCM  Procedures:None  Antimicrobials:  Anti-infectives (From admission, onward)   Start     Dose/Rate Route Frequency Ordered Stop   05/22/20 0800  levofloxacin (LEVAQUIN) tablet 750 mg  Status:  Discontinued        750 mg Oral Daily 05/21/20 1323 05/25/20 1334   05/22/20 0730  azithromycin (ZITHROMAX) tablet 500 mg  Status:  Discontinued       "Followed by" Linked Group Details   500 mg Oral Daily 05/21/20 0726 05/21/20 0841   05/21/20 1000  levofloxacin (LEVAQUIN) IVPB 500 mg  Status:  Discontinued        500 mg 100 mL/hr over 60 Minutes Intravenous Every 24 hours 05/21/20 0841 05/21/20 1323   05/21/20 0726  azithromycin (ZITHROMAX) 500 mg in sodium chloride 0.9 % 250 mL IVPB  Status:  Discontinued       "Followed by" Linked Group Details   500 mg 250 mL/hr over 60 Minutes Intravenous Every 24 hours 05/21/20 0726 05/21/20 0841      Subjective:  Patient seen and examined the bedside this morning.  Significant looks better today.  Less wheezes and rhonchi but feels short of breath on minimal ambulation and wants to stay 1 more night.  Objective: Vitals:   05/27/20 2042 05/27/20 2124 05/28/20 0140 05/28/20 0517  BP:  120/85  122/81  Pulse:  75  70  Resp:  20  20  Temp:  97.8 F (36.6 C)  98.2 F (36.8 C)  TempSrc:      SpO2: 91% 95% 92% 95%  Weight:      Height:        Intake/Output Summary (Last 24 hours) at 05/28/2020 0729 Last data filed at 05/27/2020 1800 Gross per 24 hour  Intake 960 ml  Output --  Net 960 ml   Filed Weights   05/20/20 1622  Weight: 61 kg    Examination:  General exam: More comfortable today Respiratory system: Scattered rhonchi and wheezes.   Cardiovascular system: S1 & S2 heard, RRR. No JVD, murmurs, rubs, gallops or clicks. Gastrointestinal system:  Abdomen is nondistended, soft and nontender. No organomegaly or masses felt. Normal bowel sounds heard. Central nervous system: Alert and oriented. No focal neurological deficits. Extremities: No edema, no clubbing ,no cyanosis Skin: No rashes, lesions or ulcers,no icterus ,no pallor    Data Reviewed: I have personally reviewed following labs and imaging studies  CBC: Recent Labs  Lab 05/23/20 0506 05/24/20 0521 05/25/20 0512 05/26/20 0533 05/28/20 0522  WBC 7.9 7.7 7.4 9.7 8.9  NEUTROABS 5.0 5.2 4.8 6.8 6.7  HGB 15.3* 14.6 14.8 13.9 13.4  HCT 47.4* 45.1 44.1 43.2 40.3  MCV 92.8 93.0 91.1 94.1 92.4  PLT 412* 389 365 320 841   Basic Metabolic Panel: Recent Labs  Lab 05/21/20 0919 05/21/20 0919 05/22/20 0540 05/22/20 0540 05/23/20 0506 05/24/20 0521 05/25/20 0512 05/26/20 0533 05/28/20 0522  NA 138   < > 136   < > 130* 134* 136 133* 131*  K 4.1   < > 4.2   < > 3.7 3.8 4.1  4.0 3.9  CL 100   < > 98   < > 91* 98 100 101 97*  CO2 28   < > 28   < > 28 28 28 28 26   GLUCOSE 156*   < > 141*   < > 96 96 89 94 95  BUN 15   < > 27*   < > 21 17 13 14 16   CREATININE 0.74   < > 0.83   < > 0.87 0.76 0.73 0.65 0.64  CALCIUM 9.5   < > 9.2   < > 8.9 8.8* 8.7* 8.3* 8.2*  MG 2.1   < > 2.4  --  2.2 2.1 2.0 1.9  --   PHOS 3.6  --   --   --   --   --   --   --   --    < > = values in this interval not displayed.   GFR: Estimated Creatinine Clearance: 56.9 mL/min (by C-G formula based on SCr of 0.64 mg/dL). Liver Function Tests: Recent Labs  Lab 05/21/20 0919  AST 29  ALT 32  ALKPHOS 72  BILITOT 0.3  PROT 8.0  ALBUMIN 4.2   No results for input(s): LIPASE, AMYLASE in the last 168 hours. No results for input(s): AMMONIA in the last 168 hours. Coagulation Profile: No results for input(s): INR, PROTIME in the last 168 hours. Cardiac Enzymes: No results for input(s): CKTOTAL, CKMB, CKMBINDEX, TROPONINI in the last 168 hours. BNP (last 3 results) No results for input(s): PROBNP  in the last 8760 hours. HbA1C: No results for input(s): HGBA1C in the last 72 hours. CBG: No results for input(s): GLUCAP in the last 168 hours. Lipid Profile: No results for input(s): CHOL, HDL, LDLCALC, TRIG, CHOLHDL, LDLDIRECT in the last 72 hours. Thyroid Function Tests: No results for input(s): TSH, T4TOTAL, FREET4, T3FREE, THYROIDAB in the last 72 hours. Anemia Panel: No results for input(s): VITAMINB12, FOLATE, FERRITIN, TIBC, IRON, RETICCTPCT in the last 72 hours. Sepsis Labs: No results for input(s): PROCALCITON, LATICACIDVEN in the last 168 hours.  Recent Results (from the past 240 hour(s))  SARS Coronavirus 2 by RT PCR (hospital order, performed in [facility] hospital lab) Nasopharyngeal Nasopharyngeal Swab     Status: None   Collection Time: 05/20/20  4:57 PM   Specimen: Nasopharyngeal Swab  Result Value Ref Range Status   SARS Coronavirus 2 NEGATIVE NEGATIVE Final    Comment: (NOTE) SARS-CoV-2 target nucleic acids are NOT DETECTED.  The SARS-CoV-2 RNA is generally detectable in upper and lower respiratory specimens during the acute phase of infection. The lowest concentration of SARS-CoV-2 viral copies this assay can detect is 250 copies / mL. A negative result does not preclude SARS-CoV-2 infection and should not be used as the sole basis for treatment or other patient management decisions.  A negative result may occur with improper specimen collection / handling, submission of specimen other than nasopharyngeal swab, presence of viral mutation(s) within the areas targeted by this assay, and inadequate number of viral copies (<250 copies / mL). A negative result must be combined with clinical observations, patient history, and epidemiological information.  Fact Sheet for Patients:   StrictlyIdeas.no  Fact Sheet for Healthcare Providers: BankingDealers.co.za  This test is not yet approved or  cleared by the [place] FDA and has been authorized for detection and/or diagnosis of SARS-CoV-2 by FDA under an Emergency Use Authorization (EUA).  This EUA will remain in effect (meaning this test can  be used) for the duration of the COVID-19 declaration under Section 564(b)(1) of the Act, 21 U.S.C. section 360bbb-3(b)(1), unless the authorization is terminated or revoked sooner.  Performed at [facility], [place] [address] [place], [place] [address]   Respiratory Panel by PCR     Status: None   Collection Time: 05/21/20  7:27 AM   Specimen: Nasopharyngeal Swab; Respiratory  Result Value Ref Range Status   Adenovirus NOT DETECTED NOT DETECTED Final   Coronavirus 229E NOT DETECTED NOT DETECTED Final    Comment: (NOTE) The Coronavirus on the Respiratory Panel, DOES NOT test for the novel  Coronavirus (2019 nCoV)    Coronavirus HKU1 NOT DETECTED NOT DETECTED Final   Coronavirus NL63 NOT DETECTED NOT DETECTED Final   Coronavirus OC43 NOT DETECTED NOT DETECTED Final   Metapneumovirus NOT DETECTED NOT DETECTED Final   Rhinovirus / Enterovirus NOT DETECTED NOT DETECTED Final   Influenza A NOT DETECTED NOT DETECTED Final   Influenza B NOT DETECTED NOT DETECTED Final   Parainfluenza Virus 1 NOT DETECTED NOT DETECTED Final   Parainfluenza Virus 2 NOT DETECTED NOT DETECTED Final   Parainfluenza Virus 3 NOT DETECTED NOT DETECTED Final   Parainfluenza Virus 4 NOT DETECTED NOT DETECTED Final   Respiratory Syncytial Virus NOT DETECTED NOT DETECTED Final   Bordetella pertussis NOT DETECTED NOT DETECTED Final   Chlamydophila pneumoniae NOT DETECTED NOT DETECTED Final   Mycoplasma pneumoniae NOT DETECTED NOT DETECTED Final    Comment: Performed at [facility], [place]. [address], [place], [place] [address]         Radiology Studies: DG Chest 1 View  Result Date: 05/26/2020 CLINICAL DATA:  Shortness of breath and chest congestion. Cardiac catheterization yesterday. EXAM: CHEST  1  VIEW COMPARISON:  05/20/2020 FINDINGS: Mild cardiomegaly. Mild aortic atherosclerotic calcification. Emphysema and pulmonary scarring. Postsurgical changes in the right upper lobe. No sign of acute infiltrate, mass, effusion or collapse. Evidence of pulmonary edema. IMPRESSION: No acute disease. Emphysema and pulmonary scarring. Postsurgical changes in the right upper lobe. Electronically Signed   By: Nelson Chimes M.D.   On: 05/26/2020 12:52   DG ESOPHAGUS W DOUBLE CM (HD)  Result Date: 05/27/2020 CLINICAL DATA:  Dysphagia.  Hiatal hernia. EXAM: ESOPHOGRAM / BARIUM SWALLOW / BARIUM TABLET STUDY TECHNIQUE: Combined double contrast and single contrast examination performed using effervescent crystals, thick barium liquid, and thin barium liquid. The patient was observed with fluoroscopy swallowing a 13 mm barium sulphate tablet. FLUOROSCOPY TIME:  Fluoroscopy Time:  2 minutes 24 seconds Radiation Exposure Index (if provided by the fluoroscopic device): 7.8 mGy Number of Acquired Spot Images: 0 COMPARISON:  Chest CTA dated 05/20/2020 FINDINGS: The patient swallowed barium without difficulty. No hypopharyngeal abnormalities were demonstrated. Normal primary esophageal peristalsis. There was significantly impaired secondary peristalsis with prolonged pooling of barium in the upper esophagus when the patient was in a prone RAO position. Mild intermittent tertiary peristaltic activity in the mid and distal esophagus and mild intermittent reversed peristalsis in the mid and distal esophagus. The esophagus has a normal appearance without stricture, mass or ulceration. There is a small to moderate-sized sliding hiatal hernia. No gastroesophageal reflux was seen during the examination. The patient swallowed a 12.5 mm in diameter barium tablet without difficulty. This passed normally through the esophagus and into the stomach. IMPRESSION: 1. Small to moderate-sized hiatal hernia. 2. Esophageal dysmotility, as described  above. 3. No gastroesophageal reflux, stricture or mass seen. Electronically Signed   By:  Claudie Revering M.D.   On: 05/27/2020 09:16        Scheduled Meds: . aspirin EC  81 mg Oral Daily  . atorvastatin  80 mg Oral Daily  . budesonide (PULMICORT) nebulizer solution  0.25 mg Nebulization BID  . cilostazol  100 mg Oral BID  . dextromethorphan-guaiFENesin  2 tablet Oral BID  . docusate sodium  100 mg Oral BID  . enoxaparin (LOVENOX) injection  40 mg Subcutaneous Q24H  . famotidine  40 mg Oral QHS  . feeding supplement (ENSURE ENLIVE)  237 mL Oral Q24H  . fluticasone  1 spray Each Nare BID  . ipratropium-albuterol  3 mL Nebulization Q6H  . levothyroxine  112 mcg Oral Q0600  . LORazepam  2 mg Oral QHS  . methylPREDNISolone (SOLU-MEDROL) injection  40 mg Intravenous Q24H  . metoCLOPramide  5 mg Oral TID AC & HS  . montelukast  10 mg Oral QHS  . pantoprazole  40 mg Oral BID  . PARoxetine  20 mg Oral QHS  . sodium chloride flush  3 mL Intravenous Q12H  . sodium chloride flush  3 mL Intravenous Q12H  . sodium chloride flush  3 mL Intravenous Q12H  . sodium chloride flush  3 mL Intravenous Q12H   Continuous Infusions: . sodium chloride       LOS: 8 days    Time spent: 35 mins.More than 50% of that time was spent in counseling and/or coordination of care.      Shelly Coss, MD [facility] P8/14/2021, 7:29 AM

## 2020-05-29 ENCOUNTER — Telehealth: Payer: Self-pay | Admitting: Internal Medicine

## 2020-05-29 MED ORDER — PANTOPRAZOLE SODIUM 40 MG PO TBEC: 40.0000 mg | DELAYED_RELEASE_TABLET | Freq: Every day | ORAL | 1 refills | Status: DC

## 2020-05-29 MED ORDER — IPRATROPIUM-ALBUTEROL 0.5-2.5 (3) MG/3ML IN SOLN
RESPIRATORY_TRACT | Status: AC
  Administered 2020-05-29: 3 mL
  Filled 2020-05-29: qty 3

## 2020-05-29 MED ORDER — FAMOTIDINE 40 MG PO TABS: 40.0000 mg | ORAL_TABLET | Freq: Every day | ORAL | 0 refills | Status: DC

## 2020-05-29 MED ORDER — PREDNISONE 20 MG PO TABS: 40.0000 mg | ORAL_TABLET | Freq: Every day | ORAL | 0 refills | Status: AC

## 2020-05-29 MED ORDER — PAROXETINE HCL 20 MG PO TABS: 20.0000 mg | ORAL_TABLET | Freq: Every day | ORAL | 1 refills | Status: DC

## 2020-05-29 MED ORDER — BENZONATATE 100 MG PO CAPS
100.0000 mg | ORAL_CAPSULE | Freq: Three times a day (TID) | ORAL | 0 refills | Status: DC | PRN
Start: 2020-05-29 — End: 2021-05-19

## 2020-05-29 NOTE — Discharge Summary (Signed)
Physician Discharge Summary  [name] GNO:[id number] DOB: 03-May-1946 DOA: 05/20/2020  PCP: [place], [name], MD  Admit date: 05/20/2020 Discharge date: 05/29/2020  Admitted From: Home Disposition:  Home  Discharge Condition:Stable CODE STATUS:FULL Diet recommendation: Heart Healthy   Brief/Interim Summary: Patient is a 74 year old female with history of COPD on 2 L of oxygen at home, peripheral vascular disease, hypothyroidism, hypertension, hyperlipidemia, diverticulosis, coronary artery  disease who presents to the emergency department complaints of dyspnea on exertion. Patient reported dyspnea over the past year and has been on multiple rounds of steroids/antibiotics.  She was also significantly stressed and on grief due to her son passing away in September 2020.  Patient was suspected to have COPD exacerbation.  She was started on steroids, antibiotic.  PCCM is also following.  Echocardiogram done on this admission showed reduced ejection fraction.  Cardiology consulted.  Underwent cardiac cath which did not show any significant coronary artery disease.  Hospital course remarkable for persistent wheezing, rhonchi.  Esophagram showed esophageal dysmotility.  Started on PPI and H2 blocker because reflux could have contributed to her worsening of symptoms.  She is medically stable for discharge to home today.  She will follow-up with pulmonology as an outpatient.  Following problems were addressed during her hospitalization:  Acute on chronic respiratory failure with hypoxia: Multifactorial from COPD exacerbation and possible  congestive heart failure.  She was given few doses of Lasix.  Currently on 2-3 L of oxygen per minute which is her baseline.  COPD exacerbation: Follows with pulmonology as an outpatient.  Takes trelegy at home.  Continue Singulair.  Pulmonology was following here.  She was treated with  Levaquin for COPD exacerbation treatment.   She is on 2 L of oxygen at home.  Since  her wheezing could be contributed by laryngeal pharyngeal reflux, PCCM recommended esophagram.  Esophagram showed small to moderate size hiatal hernia, esophageal dysmotility.Continue PPI bid, H2 blocker because reflux cud have contributed to her symptoms.    Will recommend outpatient follow-up with GI.  She needs to follow-up with her pulmonologist, Dr. Chase Caller as an outpatient.  Systolic congestive heart failure: Echocardiogram done on this admission showed reduced ejection fraction to 40 to 45%, left ventricle global hypokinesis.  Left heart cath showed ejection fraction of 65%, hyperdynamic left ventricle.  Chest x-ray showed mild pulmonary edema.  She was given few doses of Lasix.  Troponins have been negative.  Cardiac cath did not show any significant coronary disease.    Not put on any diuretics on discharge.  Coronary artery disease: Continue aspirin, Lipitor.  Denies any chest pain.  Chronic mesenteric ischemia: Continue Plavix, Pletal, Lipitor  Hypothyroidism: Continue Synthyroid  Hyperlipidemia: Continue statin  Hypertension: Currently blood pressure stable.  Continue current regimen  GERD: Continue PPI  Major depressive order/grief: Patient significantly depressed, stressed due to her son passing away on June 25, 2019.  Reported lack of sleep.  Started on Paxil.     Discharge Diagnoses:  Active Problems:   Hypothyroidism   Hyperlipidemia   Essential hypertension   GERD   Chronic mesenteric ischemia (HCC)   CAD (coronary artery disease)   Breast cancer, right breast (HCC)   COPD with acute exacerbation (HCC)   COPD exacerbation (HCC)   Acute on chronic respiratory failure with hypoxia (HCC)   Acute systolic CHF (congestive heart failure) (HCC)   Major depressive disorder, single episode, moderate degree (HCC)   Shortness of breath    Discharge Instructions  Discharge Instructions  Diet - low sodium heart healthy   Complete by: As directed     Discharge instructions   Complete by: As directed    1)Please take prescribed medications as instructed. 2)Follow up with your PCP in a week 3)Follow up with your pulmonologist Dr. Chase Caller in 1 to 2 weeks. 4)Follow up with Dr. Cristina Gong, gastroenterology, in 1 to 2 weeks 5)You have an appointment with cardiology on 06/08/2020 at 2 PM.   Increase activity slowly   Complete by: As directed      Allergies as of 05/29/2020      Reactions   Sulfa Antibiotics Swelling      Medication List    STOP taking these medications   omeprazole 40 MG capsule Commonly known as: PRILOSEC Replaced by: pantoprazole 40 MG tablet     TAKE these medications   albuterol 108 (90 Base) MCG/ACT inhaler Commonly known as: VENTOLIN HFA Inhale 2 puffs into the lungs every 6 (six) hours as needed for wheezing or shortness of breath.   aspirin EC 81 MG tablet Take 81 mg by mouth daily.   atorvastatin 80 MG tablet Commonly known as: LIPITOR TAKE 1 TABLET BY MOUTH EVERY DAY   benzonatate 100 MG capsule Commonly known as: Tessalon Perles Take 1 capsule (100 mg total) by mouth 3 (three) times daily as needed for cough.   cilostazol 100 MG tablet Commonly known as: PLETAL Take 1 tablet (100 mg total) by mouth 2 (two) times daily.   dextromethorphan-guaiFENesin 30-600 MG 12hr tablet Commonly known as: MUCINEX DM Take 1 tablet by mouth 2 (two) times daily.   famotidine 40 MG tablet Commonly known as: PEPCID Take 1 tablet (40 mg total) by mouth at bedtime.   fluticasone 50 MCG/ACT nasal spray Commonly known as: FLONASE SPRAY 1 SPRAY INTO EACH NOSTRIL TWICE A DAY What changed: See the new instructions.   ipratropium-albuterol 0.5-2.5 (3) MG/3ML Soln Commonly known as: DUONEB Inhale 3 mLs into the lungs every 6 (six) hours as needed (sob, wheezing).   levothyroxine 112 MCG tablet Commonly known as: SYNTHROID TAKE 1 TABLET (112 MCG TOTAL) BY MOUTH DAILY BEFORE BREAKFAST.   LORazepam 2 MG  tablet Commonly known as: ATIVAN TAKE 1 TABLET BY MOUTH AT BEDTIME AS NEEDED FOR ANXIETY What changed: See the new instructions.   montelukast 10 MG tablet Commonly known as: SINGULAIR Take 1 tablet (10 mg total) by mouth at bedtime.   pantoprazole 40 MG tablet Commonly known as: PROTONIX Take 1 tablet (40 mg total) by mouth daily. Replaces: omeprazole 40 MG capsule   PARoxetine 20 MG tablet Commonly known as: PAXIL Take 1 tablet (20 mg total) by mouth at bedtime.   predniSONE 20 MG tablet Commonly known as: DELTASONE Take 2 tablets (40 mg total) by mouth daily with breakfast for 3 days. Start taking on: May 30, 2020   Trelegy Ellipta 200-62.5-25 MCG/INH Aepb Generic drug: Fluticasone-Umeclidin-Vilant Inhale 1 puff into the lungs daily.       Follow-up Information    [facility] Follow up.   Specialty: Home Health Services Why: agency will provide home health physical therapy. Contact information: [place] [address] [phone]        [name], NP Follow up on 06/08/2020.   Specialties: Nurse Practitioner, Interventional Cardiology, Cardiology, Radiology Why: Please arrive 15 minutes early for your 2pm post-hospital cardiology appoointment Contact information: [place]. [address]  [address] [phone]  [place], [name], MD. Schedule an appointment as soon as possible for a visit in 1 week(s).   Specialty: Family Medicine Contact information: [place] [address] [phone]        Brand Males, MD. Schedule an appointment as soon as possible for a visit in 2 week(s).   Specialty: Pulmonary Disease Contact information: [place] [address] [phone]              Allergies  Allergen Reactions  . Sulfa Antibiotics Swelling    Consultations:  PCCM, cardiology   Procedures/Studies: DG Chest 1 View  Result  Date: 05/26/2020 CLINICAL DATA:  Shortness of breath and chest congestion. Cardiac catheterization yesterday. EXAM: CHEST  1 VIEW COMPARISON:  05/20/2020 FINDINGS: Mild cardiomegaly. Mild aortic atherosclerotic calcification. Emphysema and pulmonary scarring. Postsurgical changes in the right upper lobe. No sign of acute infiltrate, mass, effusion or collapse. Evidence of pulmonary edema. IMPRESSION: No acute disease. Emphysema and pulmonary scarring. Postsurgical changes in the right upper lobe. Electronically Signed   By: Nelson Chimes M.D.   On: 05/26/2020 12:52   CT Angio Chest PE W and/or Wo Contrast  Result Date: 05/20/2020 CLINICAL DATA:  Shortness of breath x2 weeks. EXAM: CT ANGIOGRAPHY CHEST WITH CONTRAST TECHNIQUE: Multidetector CT imaging of the chest was performed using the standard protocol during bolus administration of intravenous contrast. Multiplanar CT image reconstructions and MIPs were obtained to evaluate the vascular anatomy. CONTRAST:  151mL OMNIPAQUE IOHEXOL 350 MG/ML SOLN COMPARISON:  December 01, 2019 FINDINGS: Cardiovascular: There is marked severity calcification of the aortic arch with an extensive amount of calcification seen within the origin of the left common carotid artery. Satisfactory opacification of the pulmonary arteries to the segmental level. No evidence of pulmonary embolism. Normal heart size. No pericardial effusion. Moderate severity coronary artery calcification is noted Mediastinum/Nodes: No enlarged mediastinal, hilar, or axillary lymph nodes. The thyroid gland and trachea demonstrate no significant findings. There is a large hiatal hernia. Lungs/Pleura: There is marked severity emphysematous lung disease. Ill-defined surgical sutures are seen along the anterior aspect of the right upper lobe. Mild linear scarring is seen within the posterior aspect of the left upper lobe. There is no evidence of acute infiltrate, pleural effusion or pneumothorax. Upper Abdomen: No  acute abnormality. Musculoskeletal: A right breast implant is seen. Multilevel degenerative changes seen throughout the thoracic spine. Review of the MIP images confirms the above findings. IMPRESSION: 1. No evidence of pulmonary embolus. 2. Marked severity emphysematous lung disease. 3. Large hiatal hernia. Aortic Atherosclerosis (ICD10-I70.0) and Emphysema (ICD10-J43.9). Electronically Signed   By: Virgina Norfolk M.D.   On: 05/20/2020 21:37   CARDIAC CATHETERIZATION  Result Date: 05/25/2020 No significant coronary obstructive disease.  The left main is short and immediately trifurcates into a large LAD, ramus immediate and left circumflex vessel.  The midportion of the LAD dips intramyocardially slightly but there is no evidence for systolic bridging.  The RCA is a dominant vessel that has calcification without obstructive stenosis. Hyperdynamic LV function with EF estimated at 65%.  LVEDP 10 mmHg. RECOMMENDATION: Medical therapy.  DG Chest Portable 1 View  Result Date: 05/20/2020 CLINICAL DATA:  Cough.  Shortness of breath. EXAM: PORTABLE CHEST 1 VIEW COMPARISON:  April 06, 2020 FINDINGS: Emphysematous changes are again noted. There are postsurgical changes of the right lung apex. Scattered areas of pleuroparenchymal scarring are noted. The heart size is stable. There is no pneumothorax. No significant pleural effusion. IMPRESSION:  No active disease. Electronically Signed   By: Constance Holster M.D.   On: 05/20/2020 18:02   ECHOCARDIOGRAM COMPLETE  Result Date: 05/21/2020    ECHOCARDIOGRAM REPORT   Patient Name:   LIZZETT NOBILE Date of Exam: 05/21/2020 Medical Rec #:  [id number]     Height:       66.0 in Accession #:    [id number]    Weight:       134.5 lb Date of Birth:  12/16/1945     BSA:          1.689 m Patient Age:    67 years      BP:           131/94 mmHg Patient Gender: F             HR:           81 bpm. Exam Location:  Inpatient Procedure: 2D Echo STAT ECHO Indications:    Dyspnea R06.00   History:        Patient has prior history of Echocardiogram examinations, most                 recent 08/03/2019. CAD, COPD; Risk Factors:Hypertension and                 Dyslipidemia.  Sonographer:    Mikki Santee RDCS (AE) Referring Phys: [place]  Sonographer Comments: Technically difficult study due to poor echo windows. IMPRESSIONS  1. Left ventricular ejection fraction, by estimation, is 45 to 50%. The left ventricle has mildly decreased function. The left ventricle demonstrates global hypokinesis. Indeterminate diastolic filling due to E-A fusion. Definity contrast administered but images remain suboptimal to exclude regional wall motion abnormalities.  2. Right ventricular systolic function is normal. The right ventricular size is normal. Tricuspid regurgitation signal is inadequate for assessing PA pressure.  3. Left atrial size was mildly dilated.  4. The mitral valve is degenerative. Trivial mitral valve regurgitation. Mild mitral stenosis. The mean mitral valve gradient is 3.0 mmHg with average heart rate of 85 bpm.  5. The aortic valve is grossly normal. Aortic valve regurgitation is not visualized. No aortic stenosis is present.  6. The inferior vena cava is dilated in size with >50% respiratory variability, suggesting right atrial pressure of 8 mmHg. Comparison(s): A prior study was performed on 08/03/2019. Prior images reviewed side by side. LVEF compared to prior appears reduced. FINDINGS  Left Ventricle: Left ventricular ejection fraction, by estimation, is 45 to 50%. The left ventricle has mildly decreased function. The left ventricle demonstrates global hypokinesis. Definity contrast agent was given IV to delineate the left ventricular  endocardial borders. The left ventricular internal cavity size was normal in size. There is no left ventricular hypertrophy. Indeterminate diastolic filling due to E-A fusion. Right Ventricle: The right ventricular size is normal. Right vetricular  wall thickness was not assessed. Right ventricular systolic function is normal. Tricuspid regurgitation signal is inadequate for assessing PA pressure. Left Atrium: Left atrial size was mildly dilated. Right Atrium: Right atrial size was normal in size. Pericardium: There is no evidence of pericardial effusion. Mitral Valve: The mitral valve is degenerative in appearance. Normal mobility of the mitral valve leaflets. Mild to moderate mitral annular calcification. Trivial mitral valve regurgitation. Mild mitral valve stenosis. The mean mitral valve gradient is 3.0 mmHg with average heart rate of 85 bpm. Tricuspid Valve: The tricuspid valve is normal in structure. Tricuspid valve regurgitation is trivial. No evidence of  tricuspid stenosis. Aortic Valve: The aortic valve is grossly normal. Aortic valve regurgitation is not visualized. No aortic stenosis is present. Pulmonic Valve: The pulmonic valve was normal in structure. Pulmonic valve regurgitation is not visualized. No evidence of pulmonic stenosis. Aorta: The aortic root is normal in size and structure. Venous: The inferior vena cava is dilated in size with greater than 50% respiratory variability, suggesting right atrial pressure of 8 mmHg. IAS/Shunts: No atrial level shunt detected by color flow Doppler.  LEFT VENTRICLE PLAX 2D LVIDd:         4.30 cm  Diastology LVIDs:         3.00 cm  LV e' lateral:   5.92 cm/s LV PW:         1.00 cm  LV E/e' lateral: 12.4 LV IVS:        1.00 cm  LV e' medial:    4.88 cm/s LVOT diam:     2.10 cm  LV E/e' medial:  15.1 LV SV:         70 LV SV Index:   41 LVOT Area:     3.46 cm  RIGHT VENTRICLE RV S prime:     15.30 cm/s TAPSE (M-mode): 2.7 cm LEFT ATRIUM             Index       RIGHT ATRIUM           Index LA diam:        3.90 cm 2.31 cm/m  RA Area:     13.80 cm LA Vol (A2C):   51.5 ml 30.48 ml/m RA Volume:   34.60 ml  20.48 ml/m LA Vol (A4C):   51.0 ml 30.19 ml/m LA Biplane Vol: 56.4 ml 33.38 ml/m  AORTIC VALVE LVOT  Vmax:   99.80 cm/s LVOT Vmean:  62.300 cm/s LVOT VTI:    0.202 m  AORTA Ao Root diam: 3.00 cm MITRAL VALVE MV Area (PHT): 3.89 cm     SHUNTS MV Mean grad:  3.0 mmHg     Systemic VTI:  0.20 m MV Decel Time: 195 msec     Systemic Diam: 2.10 cm MV E velocity: 73.50 cm/s MV A velocity: 128.00 cm/s MV E/A ratio:  0.57 Cherlynn Kaiser MD Electronically signed by Cherlynn Kaiser MD Signature Date/Time: 05/21/2020/2:10:00 PM    Final    DG ESOPHAGUS W DOUBLE CM (HD)  Result Date: 05/27/2020 CLINICAL DATA:  Dysphagia.  Hiatal hernia. EXAM: ESOPHOGRAM / BARIUM SWALLOW / BARIUM TABLET STUDY TECHNIQUE: Combined double contrast and single contrast examination performed using effervescent crystals, thick barium liquid, and thin barium liquid. The patient was observed with fluoroscopy swallowing a 13 mm barium sulphate tablet. FLUOROSCOPY TIME:  Fluoroscopy Time:  2 minutes 24 seconds Radiation Exposure Index (if provided by the fluoroscopic device): 7.8 mGy Number of Acquired Spot Images: 0 COMPARISON:  Chest CTA dated 05/20/2020 FINDINGS: The patient swallowed barium without difficulty. No hypopharyngeal abnormalities were demonstrated. Normal primary esophageal peristalsis. There was significantly impaired secondary peristalsis with prolonged pooling of barium in the upper esophagus when the patient was in a prone RAO position. Mild intermittent tertiary peristaltic activity in the mid and distal esophagus and mild intermittent reversed peristalsis in the mid and distal esophagus. The esophagus has a normal appearance without stricture, mass or ulceration. There is a small to moderate-sized sliding hiatal hernia. No gastroesophageal reflux was seen during the examination. The patient swallowed a 12.5 mm in diameter barium tablet without difficulty. This  passed normally through the esophagus and into the stomach. IMPRESSION: 1. Small to moderate-sized hiatal hernia. 2. Esophageal dysmotility, as described above. 3. No  gastroesophageal reflux, stricture or mass seen. Electronically Signed   By: Claudie Revering M.D.   On: 05/27/2020 09:16       Subjective: Patient seen and examined at the bedside this morning.  Hemodynamically stable for discharge today.  Discharge Exam: Vitals:   05/29/20 0655 05/29/20 0806  BP: 104/74   Pulse: 70   Resp:    Temp: 98 F (36.7 C)   SpO2: 94% 90%   Vitals:   05/29/20 0230 05/29/20 0440 05/29/20 0655 05/29/20 0806  BP:   104/74   Pulse:   70   Resp:      Temp:   98 F (36.7 C)   TempSrc:   Oral   SpO2: 97% 94% 94% 90%  Weight:      Height:        General: Pt is alert, awake, not in acute distress Cardiovascular: RRR, S1/S2 +, no rubs, no gallops Respiratory: Bilateral decreased air entry, mild wheezes and rhonchi Abdominal: Soft, NT, ND, bowel sounds + Extremities: no edema, no cyanosis    The results of significant diagnostics from this hospitalization (including imaging, microbiology, ancillary and laboratory) are listed below for reference.     Microbiology: Recent Results (from the past 240 hour(s))  SARS Coronavirus 2 by RT PCR (hospital order, performed in [facility] hospital lab) Nasopharyngeal Nasopharyngeal Swab     Status: None   Collection Time: 05/20/20  4:57 PM   Specimen: Nasopharyngeal Swab  Result Value Ref Range Status   SARS Coronavirus 2 NEGATIVE NEGATIVE Final    Comment: (NOTE) SARS-CoV-2 target nucleic acids are NOT DETECTED.  The SARS-CoV-2 RNA is generally detectable in upper and lower respiratory specimens during the acute phase of infection. The lowest concentration of SARS-CoV-2 viral copies this assay can detect is 250 copies / mL. A negative result does not preclude SARS-CoV-2 infection and should not be used as the sole basis for treatment or other patient management decisions.  A negative result may occur with improper specimen collection / handling, submission of specimen other than nasopharyngeal swab, presence  of viral mutation(s) within the areas targeted by this assay, and inadequate number of viral copies (<250 copies / mL). A negative result must be combined with clinical observations, patient history, and epidemiological information.  Fact Sheet for Patients:   StrictlyIdeas.no  Fact Sheet for Healthcare Providers: BankingDealers.co.za  This test is not yet approved or  cleared by the [place] FDA and has been authorized for detection and/or diagnosis of SARS-CoV-2 by FDA under an Emergency Use Authorization (EUA).  This EUA will remain in effect (meaning this test can be used) for the duration of the COVID-19 declaration under Section 564(b)(1) of the Act, 21 U.S.C. section 360bbb-3(b)(1), unless the authorization is terminated or revoked sooner.  Performed at [facility], [place] [address], [place], [place] [address]   Respiratory Panel by PCR     Status: None   Collection Time: 05/21/20  7:27 AM   Specimen: Nasopharyngeal Swab; Respiratory  Result Value Ref Range Status   Adenovirus NOT DETECTED NOT DETECTED Final   Coronavirus 229E NOT DETECTED NOT DETECTED Final    Comment: (NOTE) The Coronavirus on the Respiratory Panel, DOES NOT test for the novel  Coronavirus (2019 nCoV)    Coronavirus HKU1 NOT DETECTED NOT DETECTED Final   Coronavirus NL63  NOT DETECTED NOT DETECTED Final   Coronavirus OC43 NOT DETECTED NOT DETECTED Final   Metapneumovirus NOT DETECTED NOT DETECTED Final   Rhinovirus / Enterovirus NOT DETECTED NOT DETECTED Final   Influenza A NOT DETECTED NOT DETECTED Final   Influenza B NOT DETECTED NOT DETECTED Final   Parainfluenza Virus 1 NOT DETECTED NOT DETECTED Final   Parainfluenza Virus 2 NOT DETECTED NOT DETECTED Final   Parainfluenza Virus 3 NOT DETECTED NOT DETECTED Final   Parainfluenza Virus 4 NOT DETECTED NOT DETECTED Final   Respiratory Syncytial Virus NOT DETECTED NOT DETECTED Final    Bordetella pertussis NOT DETECTED NOT DETECTED Final   Chlamydophila pneumoniae NOT DETECTED NOT DETECTED Final   Mycoplasma pneumoniae NOT DETECTED NOT DETECTED Final    Comment: Performed at [facility], [place] [address] [place], [place] [address]     Labs: BNP (last 3 results) Recent Labs    05/20/20 1657  BNP 61.6   Basic Metabolic Panel: Recent Labs  Lab 05/23/20 0506 05/24/20 0521 05/25/20 0512 05/26/20 0533 05/28/20 0522  NA 130* 134* 136 133* 131*  K 3.7 3.8 4.1 4.0 3.9  CL 91* 98 100 101 97*  CO2 28 28 28 28 26   GLUCOSE 96 96 89 94 95  BUN 21 17 13 14 16   CREATININE 0.87 0.76 0.73 0.65 0.64  CALCIUM 8.9 8.8* 8.7* 8.3* 8.2*  MG 2.2 2.1 2.0 1.9  --    Liver Function Tests: No results for input(s): AST, ALT, ALKPHOS, BILITOT, PROT, ALBUMIN in the last 168 hours. No results for input(s): LIPASE, AMYLASE in the last 168 hours. No results for input(s): AMMONIA in the last 168 hours. CBC: Recent Labs  Lab 05/23/20 0506 05/24/20 0521 05/25/20 0512 05/26/20 0533 05/28/20 0522  WBC 7.9 7.7 7.4 9.7 8.9  NEUTROABS 5.0 5.2 4.8 6.8 6.7  HGB 15.3* 14.6 14.8 13.9 13.4  HCT 47.4* 45.1 44.1 43.2 40.3  MCV 92.8 93.0 91.1 94.1 92.4  PLT 412* 389 365 320 300   Cardiac Enzymes: No results for input(s): CKTOTAL, CKMB, CKMBINDEX, TROPONINI in the last 168 hours. BNP: Invalid input(s): POCBNP CBG: No results for input(s): GLUCAP in the last 168 hours. D-Dimer No results for input(s): DDIMER in the last 72 hours. Hgb A1c No results for input(s): HGBA1C in the last 72 hours. Lipid Profile No results for input(s): CHOL, HDL, LDLCALC, TRIG, CHOLHDL, LDLDIRECT in the last 72 hours. Thyroid function studies No results for input(s): TSH, T4TOTAL, T3FREE, THYROIDAB in the last 72 hours.  Invalid input(s): FREET3 Anemia work up No results for input(s): VITAMINB12, FOLATE, FERRITIN, TIBC, IRON, RETICCTPCT in the last 72 hours. Urinalysis    Component Value  Date/Time   COLORURINE STRAW (A) 05/21/2020 0749   APPEARANCEUR CLEAR 05/21/2020 0749   LABSPEC 1.012 05/21/2020 0749   LABSPEC 1.030 03/08/2009 1016   PHURINE 6.0 05/21/2020 0749   GLUCOSEU NEGATIVE 05/21/2020 0749   GLUCOSEU NEGATIVE 09/07/2009 0752   HGBUR SMALL (A) 05/21/2020 0749   HGBUR negative 11/09/2010 0958   BILIRUBINUR NEGATIVE 05/21/2020 0749   BILIRUBINUR Negative 03/08/2009 1016   KETONESUR NEGATIVE 05/21/2020 0749   PROTEINUR NEGATIVE 05/21/2020 0749   UROBILINOGEN 0.2 03/23/2013 0248   NITRITE NEGATIVE 05/21/2020 0749   LEUKOCYTESUR NEGATIVE 05/21/2020 0749   LEUKOCYTESUR Negative 03/08/2009 1016   Sepsis Labs Invalid input(s): PROCALCITONIN,  WBC,  LACTICIDVEN Microbiology Recent Results (from the past 240 hour(s))  SARS Coronavirus 2 by RT PCR (hospital order, performed in [place]  hospital lab) Nasopharyngeal Nasopharyngeal Swab     Status: None   Collection Time: 05/20/20  4:57 PM   Specimen: Nasopharyngeal Swab  Result Value Ref Range Status   SARS Coronavirus 2 NEGATIVE NEGATIVE Final    Comment: (NOTE) SARS-CoV-2 target nucleic acids are NOT DETECTED.  The SARS-CoV-2 RNA is generally detectable in upper and lower respiratory specimens during the acute phase of infection. The lowest concentration of SARS-CoV-2 viral copies this assay can detect is 250 copies / mL. A negative result does not preclude SARS-CoV-2 infection and should not be used as the sole basis for treatment or other patient management decisions.  A negative result may occur with improper specimen collection / handling, submission of specimen other than nasopharyngeal swab, presence of viral mutation(s) within the areas targeted by this assay, and inadequate number of viral copies (<250 copies / mL). A negative result must be combined with clinical observations, patient history, and epidemiological information.  Fact Sheet for Patients:    StrictlyIdeas.no  Fact Sheet for Healthcare Providers: BankingDealers.co.za  This test is not yet approved or  cleared by the [place] FDA and has been authorized for detection and/or diagnosis of SARS-CoV-2 by FDA under an Emergency Use Authorization (EUA).  This EUA will remain in effect (meaning this test can be used) for the duration of the COVID-19 declaration under Section 564(b)(1) of the Act, 21 U.S.C. section 360bbb-3(b)(1), unless the authorization is terminated or revoked sooner.  Performed at [facility], [place] [address] [place], [place] [address]   Respiratory Panel by PCR     Status: None   Collection Time: 05/21/20  7:27 AM   Specimen: Nasopharyngeal Swab; Respiratory  Result Value Ref Range Status   Adenovirus NOT DETECTED NOT DETECTED Final   Coronavirus 229E NOT DETECTED NOT DETECTED Final    Comment: (NOTE) The Coronavirus on the Respiratory Panel, DOES NOT test for the novel  Coronavirus (2019 nCoV)    Coronavirus HKU1 NOT DETECTED NOT DETECTED Final   Coronavirus NL63 NOT DETECTED NOT DETECTED Final   Coronavirus OC43 NOT DETECTED NOT DETECTED Final   Metapneumovirus NOT DETECTED NOT DETECTED Final   Rhinovirus / Enterovirus NOT DETECTED NOT DETECTED Final   Influenza A NOT DETECTED NOT DETECTED Final   Influenza B NOT DETECTED NOT DETECTED Final   Parainfluenza Virus 1 NOT DETECTED NOT DETECTED Final   Parainfluenza Virus 2 NOT DETECTED NOT DETECTED Final   Parainfluenza Virus 3 NOT DETECTED NOT DETECTED Final   Parainfluenza Virus 4 NOT DETECTED NOT DETECTED Final   Respiratory Syncytial Virus NOT DETECTED NOT DETECTED Final   Bordetella pertussis NOT DETECTED NOT DETECTED Final   Chlamydophila pneumoniae NOT DETECTED NOT DETECTED Final   Mycoplasma pneumoniae NOT DETECTED NOT DETECTED Final    Comment: Performed at [facility], [place]. [address], [place], [place] [address]     Please note: You were cared for by a hospitalist during your hospital stay. Once you are discharged, your primary care physician will handle any further medical issues. Please note that NO REFILLS for any discharge medications will be authorized once you are discharged, as it is imperative that you return to your primary care physician (or establish a relationship with a primary care physician if you do not have one) for your post hospital discharge needs so that they can reassess your need for medications and monitor your lab values.    Time coordinating discharge: 40 minutes  SIGNED:   Shelly Coss,  MD  [facility] 05/29/2020, 10:32 AM Pager [phone]  If 7PM-7AM, please contact night-coverage [email or web address] Password [email or web address]

## 2020-05-29 NOTE — Progress Notes (Signed)
Pt to be discharged to home today. Pt and Pt's Husband given discharge teaching including all Medications and schedules for these Medications. Understanding verbalized by Pt and Husband. AVS discharge papers with Pt at time of discharge

## 2020-05-29 NOTE — Telephone Encounter (Signed)
Got message from [facility] that patient admitted for AECOPD. Needs post hospital followup < 10 days with APP

## 2020-05-30 ENCOUNTER — Other Ambulatory Visit: Payer: Self-pay | Admitting: Family Medicine

## 2020-05-30 DIAGNOSIS — F419 Anxiety disorder, unspecified: Secondary | ICD-10-CM

## 2020-05-30 DIAGNOSIS — G4700 Insomnia, unspecified: Secondary | ICD-10-CM

## 2020-05-30 NOTE — Telephone Encounter (Signed)
Pt does have an appt scheduled with [facility] 8/30. Nothing further needed.

## 2020-05-30 NOTE — Progress Notes (Signed)
CARDIOLOGY OFFICE NOTE  Date:  06/08/2020    RAYNESHA TIEDT Date of Birth: February 03, 1946 Medical Record #[id number]  PCP:  [place], [name], MD  Cardiologist:  Servando Snare   Chief Complaint  Patient presents with  . Hospitalization Follow-up  . Follow-up    History of Present Illness: [name] is a 74 y.o. female who presents today for a follow up/post hospital visit. 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 She elected to seeme going forward.   She has a history of smoking, HTN, Gold stage III COPD, right breast cancer, hyperlipidemia, PAD, and mesenteric vascular disease. She had a mesenteric angiogram done in 5/12 by Dr. Bridgett Larsson, showing occluded celiac and SMA arteries with patent IMA. She was planned for aorto-mesenteric bypass. She was referred for cardiology evaluation prior to surgery. However, it was ultimately decided not to treat her mesenteric disease surgically.   Given her exertional chest tightness and shortness of breath as well as known vascular disease, Dr. Aundra Dubin set her up for left heart cath. This was done in 5/12 and showed a 70-80% ostial stenosis of a small to moderate 2nd diagonal. This was too small to intervene on and not likely to be particularly symptomatic. Lower extremity arterial dopplers showed a severe focal proximal right SFA stenosis with collaterals from the PFA. She had a VATS for a right-sided lung nodule that showed granulomatous inflammation (no cancer). She quit smoking for a short time. She saw Dr. Fletcher Anon for PAD evaluation, and it was decided to treat her medically. She tried cilostazol but did not see much difference with its use so stopped it.   I have seen her sporadically over the past several years - she has been referred back to [facility] to Dr. Donzetta Matters for her vascular disease. Her son has died and this has been very upsetting - she has returned to smoking. I last saw her in January of 2021 - her medicines were unclear - she was not able to afford  some of them - having lots of claudication. BP different between her arms. She was dizzy. Having GI complaints as well.   Presented earlier this month to the hospital with worsening DOE. Has had multiple rounds of antibiotics and steroids over the past year. Has resumed smoking due to the death of her son - lots of stress. Echo showed reduced EF. Seen by cardiology and was cathed - no significant disease noted. She was started on Paxil given significant depression.   She had been sent to see Dr. Radford Pax from her PCP due to concerns for dizziness and hypotension. She has known marked difference in BP between her arms. She was in the hospital and was not able to keep this appointment.   Comes in today. Here with her husband today - Ron. She is not feeling well. Has been home about 2 weeks. Not sleeping - out of Ativan and waiting on refill.has been on this for many many years.  BP has been low. Sounds like it has been checked mostly in the left arm. She has been dizzy. She is now on Paxil - she notes is crying less. The anniversary of her son's death is coming up in 07/19/23 along with his birthday. She is now on oxygen all the time - was just at night. Says she was to have "some hormone level checked" today - unclear to me. She says she is not smoking. She says that she has been told "really nothing else to do for  her". She denies belly pain. Has had some constipation but feels like her diarrhea may be coming back. She is due to see Dr. Donzetta Matters.   Past Medical History:  Diagnosis Date  . Bilateral leg cramps   . Bladder neoplasm   . Chronic deep vein thrombosis (DVT) of right lower extremity ([place])    05/ 2018  chronic non-occlusive DVT RLE  . COPD, frequent exacerbations ([place])    03-06-2018 per pt last exacerbation 12/ 2018  . Coronary artery disease    cardiologist-  dr Aundra Dubin-- 03-11-2018 er cath , 80% stenosis in the ostial second diagonal  . Dyspnea on minimal exertion   . Emphysema/COPD ([place])     CAT score- 17  . Family history of breast cancer   . Family history of colon cancer   . Family history of melanoma   . Family history of ovarian cancer   . Family history of pancreatic cancer   . Fibromyalgia 1995  . GERD (gastroesophageal reflux disease)   . Hiatal hernia   . History of breast cancer   . History of diverticulitis of colon    2002  s/p  sigmoid colectomy  . History of multiple pulmonary nodules    hx RUL nodules x2  s/p right VATS w/ wedge resection 09-11-2005 and 06-14-2011  both  necrotizing granulomatous inflammation w/ cystic area of necrosis & focal calcification  . History of right breast cancer oncologist-  dr Jana Hakim-- no recurrence   dx 2010--  IDC, Stage IA , ER/PR+,  (pT1c pN0) 11-09-2008 right lumpectomy;  12-18-2008 right simple mastectomy for DCIS margins;  completed chemotherapy 2010 (no radiation) and completed antiestrogen therapy  . Hx of colonic polyps    Dr. Cristina Gong -last study '11  . Hyperlipidemia   . Hypertension   . Hypothyroidism   . 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 Nocturia   . OA (osteoarthritis)    thumbs  . On supplemental oxygen therapy    03-06-2018 per pt uses only at night,  checks O2 sats at home,  stated am sat 89% after moving around average 93-94% with RA  . Osteoporosis   . Peripheral arterial occlusive disease [facility]) vascular-- dr chen/ dr Donzetta Matters   proximal right SFA severe focal stenosis with collaterals from the PFA/  04/ 2012 occluded celiac and SMA arteries with distal reconstitution w/ patent IMA  . Peripheral vascular disease ([place])    chronic DVT RLE,  mesenteric vascular disease  . Right lower lobe pulmonary nodule    Chest CT 08-29-2017  . Stage 3 severe COPD by GOLD classification ([place]) hx frequent exacerbations--   pulmologist-  dr Maryann Alar--  per lov note , dated 12-20-2017, oxyogen 2L is prescribed for use with exertion (but pt only uses mostly at night), O2 sats on RA run  the 80s , this day sat 86% RA and with 2L O2 sat 92%  . Varicose veins of leg with swelling    varicose vein surgery - Dr. Aleda Grana  . Wears glasses   . Wears hearing aid in both ears     Past Surgical History:  Procedure Laterality Date  . BREAST EXCISIONAL BIOPSY Left 10/15/2008  . BREAST LUMPECTOMY W/ NEEDLE LOCALIZATION Right 11-09-2008    dr Brantley Stage   [facility]  . CARDIAC CATHETERIZATION  03-12-2011   dr Aundra Dubin   70-80% ostial stenosis in small second diagonal (appears to small for intervention),  dLAD 40-50%,  minimal luminal  irregulartieis involoving LCFx and RCA,  LVEF 55%  . CATARACT EXTRACTION W/ INTRAOCULAR LENS  IMPLANT, BILATERAL  2011  . CYSTOSCOPY N/A 03/11/2018   Procedure: CYSTOSCOPY WITH INSTILLATION OF POST OPERATIVE EPIRUBICIN;  Surgeon: Festus Aloe, MD;  Location: [facility];  Service: Urology;  Laterality: N/A;  . LEFT HEART CATH AND CORONARY ANGIOGRAPHY N/A 05/25/2020   Procedure: LEFT HEART CATH AND CORONARY ANGIOGRAPHY;  Surgeon: Troy Sine, MD;  Location: [facility];  Service: Cardiovascular;  Laterality: N/A;  . MASTECTOMY Right   . PARTIAL COLECTOMY  2002   sigmoid and appectomy (diverticulitis)  . PARTIAL MASTECTOMY WITH NEEDLE LOCALIZATION Left 02/23/2013   Procedure:  LEFT PARTIAL MASTECTOMY WITH NEEDLE LOCALIZATION;  Surgeon: Adin Hector, MD;  Location: [facility];  Service: General;  Laterality: Left;  . RECONSTRUCTION BREAST W/ LATISSIMUS DORSI FLAP Right 05-30-2009   dr Harlow Mares  [facility]  . REDUCTION MAMMAPLASTY Left   . SIMPLE MASTECTOMY Right 12-28-2008    dr Brantley Stage  [facility]  . TRANSTHORACIC ECHOCARDIOGRAM  07-09-2016  dr Aundra Dubin   ef 55-60%, grade 1 diastoic dysfunction/  mild TR  . TRANSURETHRAL RESECTION OF BLADDER TUMOR N/A 03/11/2018   Procedure: TRANSURETHRAL RESECTION OF BLADDER TUMOR (TURBT) 2-5cm;  Surgeon: Festus Aloe, MD;  Location: [facility];  Service: Urology;  Laterality: N/A;  . VAGINAL HYSTERECTOMY  1973  .  VIDEO ASSISTED THORACOSCOPY (VATS)/WEDGE RESECTION Right 09-11-2005  &  06-14-2011   dr hendrickso  [facility]   both RUL     Medications: Current Meds  Medication Sig  . albuterol (VENTOLIN HFA) 108 (90 Base) MCG/ACT inhaler Inhale 2 puffs into the lungs every 6 (six) hours as needed for wheezing or shortness of breath.  Marland Kitchen aspirin EC 81 MG tablet Take 81 mg by mouth daily.  Marland Kitchen atorvastatin (LIPITOR) 80 MG tablet TAKE 1 TABLET BY MOUTH EVERY DAY  . benzonatate (TESSALON PERLES) 100 MG capsule Take 1 capsule (100 mg total) by mouth 3 (three) times daily as needed for cough.  . cilostazol (PLETAL) 100 MG tablet Take 1 tablet (100 mg total) by mouth 2 (two) times daily.  Marland Kitchen dextromethorphan-guaiFENesin (MUCINEX DM) 30-600 MG 12hr tablet Take 1 tablet by mouth 2 (two) times daily.  . famotidine (PEPCID) 40 MG tablet Take 1 tablet (40 mg total) by mouth at bedtime.  . fluticasone (FLONASE) 50 MCG/ACT nasal spray USE 1 SPRAY INTO EACH NOSTRIL TWICE A DAY  . Fluticasone-Umeclidin-Vilant (TRELEGY ELLIPTA) 200-62.5-25 MCG/INH AEPB Inhale 1 puff into the lungs daily.  Marland Kitchen ipratropium-albuterol (DUONEB) 0.5-2.5 (3) MG/3ML SOLN Inhale 3 mLs into the lungs every 6 (six) hours as needed (sob, wheezing).  Marland Kitchen levothyroxine (SYNTHROID) 112 MCG tablet TAKE 1 TABLET (112 MCG TOTAL) BY MOUTH DAILY BEFORE BREAKFAST.  Marland Kitchen LORazepam (ATIVAN) 2 MG tablet Take 1 tablet (2 mg total) by mouth at bedtime.  . montelukast (SINGULAIR) 10 MG tablet Take 1 tablet (10 mg total) by mouth at bedtime.  . pantoprazole (PROTONIX) 40 MG tablet Take 1 tablet (40 mg total) by mouth daily.  Marland Kitchen PARoxetine (PAXIL) 20 MG tablet Take 0.5 tablets (10 mg total) by mouth at bedtime.  . traZODone (DESYREL) 100 MG tablet Take 100 mg by mouth at bedtime.     Allergies: Allergies  Allergen Reactions  . Sulfa Antibiotics Swelling    Social History: The patient  reports that she quit smoking about 4 years ago. Her smoking use included cigarettes. She  has a 37.50 pack-year smoking history.  She has never used smokeless tobacco. She reports current alcohol use of about 10.0 standard drinks of alcohol per week. She reports that she does not use drugs.   Family History: The patient's family history includes Breast cancer (age of onset: 76) in her niece; COPD in her mother; Cancer in her sister; Colon cancer in her mother; Coronary artery disease in her brother and father; Diabetes in her mother; Emphysema in her mother; Heart attack in her brother and father; Heart disease in her father; Hypertension in her mother, sister, and sister; Melanoma in her niece and sister; Ovarian cancer in her sister; Pancreatic cancer in her sister; Sudden death in her father; Throat cancer in her sister.   Review of Systems: Please see the history of present illness.   All other systems are reviewed and negative.   Physical Exam: VS:  Ht 5\' 6"  (1.676 m)   Wt 137 lb 12.8 oz (62.5 kg)   BMI 22.24 kg/m  .  BMI Body mass index is 22.24 kg/m.  Wt Readings from Last 3 Encounters:  06/08/20 137 lb 12.8 oz (62.5 kg)  05/20/20 134 lb 7.7 oz (61 kg)  04/25/20 139 lb 6.4 oz (63.2 kg)   Her blood pressure is 132/60 in the right arm and 80/60 in the left arm.   General: Alert. She is short of breath with talking. She has oxygen in place.  She weighed 140# at our last visit in January.  Cardiac: Regular rate and rhythm. No murmurs, rubs, or gallops. No edema.  Respiratory:  Decreased breath sounds and with increased work of breathing.  GI: Soft and nontender.  MS: No deformity or atrophy. Gait and ROM intact.  Skin: Warm and dry. Color is normal.  Neuro:  Strength and sensation are intact and no gross focal deficits noted.  Psych: Alert, appropriate and with normal affect.   LABORATORY DATA:  EKG:  EKG is not ordered today.  Lab Results  Component Value Date   WBC 8.9 05/28/2020   HGB 13.4 05/28/2020   HCT 40.3 05/28/2020   PLT 300 05/28/2020   GLUCOSE 95  05/28/2020   CHOL 147 06/04/2018   TRIG 73 06/04/2018   HDL 83 06/04/2018   LDLDIRECT 88.0 08/18/2013   LDLCALC 49 06/04/2018   ALT 32 05/21/2020   AST 29 05/21/2020   NA 131 (L) 05/28/2020   K 3.9 05/28/2020   CL 97 (L) 05/28/2020   CREATININE 0.64 05/28/2020   BUN 16 05/28/2020   CO2 26 05/28/2020   TSH 0.433 05/21/2020   INR 0.98 03/23/2013   HGBA1C 5.7 (H) 01/03/2018     BNP (last 3 results) Recent Labs    05/20/20 1657  BNP 99.7    ProBNP (last 3 results) No results for input(s): PROBNP in the last 8760 hours.   Other Studies Reviewed Today:  LEFT HEART CATH AND CORONARY ANGIOGRAPHY 05/2010  Conclusion  No significant coronary obstructive disease.  The left main is short and immediately trifurcates into a large LAD, ramus immediate and left circumflex vessel.  The midportion of the LAD dips intramyocardially slightly but there is no evidence for systolic bridging.  The RCA is a dominant vessel that has calcification without obstructive stenosis.  Hyperdynamic LV function with EF estimated at 65%.  LVEDP 10 mmHg.  RECOMMENDATION: Medical therapy.   Echo 05/21/20 IMPRESSIONS  1. Left ventricular ejection fraction, by estimation, is 45 to 50%. The  left ventricle has mildly decreased function. The left ventricle  demonstrates global hypokinesis. Indeterminate diastolic filling due to  E-A fusion. Definity contrast administered  but images remain suboptimal to exclude regional wall motion  abnormalities.  2. Right ventricular systolic function is normal. The right ventricular  size is normal. Tricuspid regurgitation signal is inadequate for assessing  PA pressure.  3. Left atrial size was mildly dilated.  4. The mitral valve is degenerative. Trivial mitral valve regurgitation.  Mild mitral stenosis. The mean mitral valve gradient is 3.0 mmHg with  average heart rate of 85 bpm.  5. The aortic valve is grossly normal. Aortic valve regurgitation is not    visualized. No aortic stenosis is present.  6. The inferior vena cava is dilated in size with >50% respiratory  variability, suggesting right atrial pressure of 8 mmHg.      Assessment & Plan:  1. Worsening SOB - recent COPD exacerbation with hypoxia and probably some diastolic HF. She is now on oxygen. She says that there "is nothing else to do for her".   2. Recent cath - she does not have significant CAD.   3. Dizziness - suspect this is multifactorial.   4. 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 Will get upper extremity arterial doppler and get her OV with Dr. Donzetta Matters that is now due. I think our options are quite limited. BP in the right arm does not support use of Midodrine/Florinef.   5. PAD - mesenteric ischemia and lower extremity disease - followed by Dr. Donzetta Matters - no current abdominal pain and sounds like she is not having significant diarrhea at this time. Weight is only down a few pounds. There was mention to consider angiography with possible stenting of her celiac artery  - she was to go back in 6 months (due now).   6. Mild LV dysfunction - but hyperdynamic on cath. She is on no cardiac medicines.   7. Prior tobacco abuse- she tells me she is not smoking.   8. HLD - on statin  9. 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 She says she is not crying as much now.   Current medicines are reviewed with the patient today.  The patient does not have concerns regarding medicines other than what has been noted above.  The following changes have been made:  See above.  Labs/ tests ordered today include:    Orders Placed This Encounter  Procedures  . Basic metabolic panel  . CBC  . VAS [place] UPPER EXTREMITY ARTERIAL DUPLEX     Disposition:   FU with me in a few months. Her overall prognosis is tenuous in my opinion with limited options - may need to consider  Palliative care/hospice services going forward.   Patient is agreeable to this plan and will call if any problems develop in the interim.   SignedTruitt Merle, NP  06/08/2020 4:26 PM  [place] [address] [place], [place]  [address] Phone: [phone] Fax: [phone]

## 2020-05-30 NOTE — Telephone Encounter (Signed)
**Note De-Identified  Obfuscation** Patient contacted regarding discharge from [facility] on 05/29/2020.  Patient understands to follow up with provider Truitt Merle, NP on 06/08/2020 at 2:15 at [address] [place] in [place], [place] [address]. Patient understands discharge instructions? Yes Patient understands medications and regiment? Yes Patient understands to bring all medications to this visit? Yes  Ask patient:  Are you enrolled in My Chart: Yes

## 2020-05-31 ENCOUNTER — Telehealth: Payer: Self-pay | Admitting: Internal Medicine

## 2020-05-31 ENCOUNTER — Encounter: Payer: Medicare Other | Admitting: Physical Therapy

## 2020-06-01 ENCOUNTER — Other Ambulatory Visit: Payer: Self-pay | Admitting: Family Medicine

## 2020-06-01 NOTE — Telephone Encounter (Signed)
Spoke with patient regarding her hospital f/u appointment with Derl Barrow NP.  She was requesting to see Dr. Chase Caller.  I advised her that he did not have any appointments available prior to her 8/30 appointment with [facility] and we are actually scheduling into October and his schedule does not open until this afternoon.  She wanted her 10:30 am appointment with Beth changed to the afternoon so I scheduled for 2:30 pm.  She requested that I cancel her 9/7 appointment with [facility], appointment cancelled.  Nothing further needed.

## 2020-06-02 ENCOUNTER — Encounter: Payer: Medicare Other | Admitting: Physical Therapy

## 2020-06-03 ENCOUNTER — Telehealth: Payer: Self-pay | Admitting: Family Medicine

## 2020-06-03 NOTE — Telephone Encounter (Signed)
Patient has an appointment with you on Tuesday.

## 2020-06-03 NOTE — Telephone Encounter (Signed)
[name] is calling stating that the pts Bp is running a little low 82/55 90/76 and 95/55 and still dizzy,lighthead,weak,SOB, tired and her weight has gone up since she was in the hospital today it is 137.  Pt state that she does not have a appetite and stomach feels full.  [name] would like for [place] to follow-up with the pt.

## 2020-06-06 ENCOUNTER — Telehealth: Payer: Self-pay | Admitting: Family Medicine

## 2020-06-06 NOTE — Telephone Encounter (Signed)
Pt has appt tomorrow

## 2020-06-06 NOTE — Telephone Encounter (Signed)
Dominica Severin  from [facility] care called to say pt weight has gone up to 139 today and it's outside of the perimeters of 135  Contact information is [phone]  Please advise

## 2020-06-07 ENCOUNTER — Encounter: Payer: Self-pay | Admitting: Family Medicine

## 2020-06-07 ENCOUNTER — Telehealth: Payer: Self-pay | Admitting: Family Medicine

## 2020-06-07 ENCOUNTER — Encounter: Payer: Medicare Other | Admitting: Physical Therapy

## 2020-06-07 ENCOUNTER — Telehealth (INDEPENDENT_AMBULATORY_CARE_PROVIDER_SITE_OTHER): Payer: Medicare Other | Admitting: Family Medicine

## 2020-06-07 VITALS — Ht 66.0 in

## 2020-06-07 DIAGNOSIS — J9611 Chronic respiratory failure with hypoxia: Secondary | ICD-10-CM

## 2020-06-07 DIAGNOSIS — I5032 Chronic diastolic (congestive) heart failure: Secondary | ICD-10-CM | POA: Insufficient documentation

## 2020-06-07 DIAGNOSIS — I959 Hypotension, unspecified: Secondary | ICD-10-CM | POA: Diagnosis not present

## 2020-06-07 DIAGNOSIS — K219 Gastro-esophageal reflux disease without esophagitis: Secondary | ICD-10-CM

## 2020-06-07 DIAGNOSIS — G4700 Insomnia, unspecified: Secondary | ICD-10-CM | POA: Diagnosis not present

## 2020-06-07 DIAGNOSIS — F419 Anxiety disorder, unspecified: Secondary | ICD-10-CM

## 2020-06-07 DIAGNOSIS — I5020 Unspecified systolic (congestive) heart failure: Secondary | ICD-10-CM

## 2020-06-07 DIAGNOSIS — E871 Hypo-osmolality and hyponatremia: Secondary | ICD-10-CM

## 2020-06-07 MED ORDER — PAROXETINE HCL 20 MG PO TABS: 10.0000 mg | ORAL_TABLET | Freq: Every day | ORAL | 1 refills | Status: DC

## 2020-06-07 MED ORDER — LORAZEPAM 2 MG PO TABS: 2.0000 mg | ORAL_TABLET | Freq: Every day | ORAL | 2 refills | Status: DC

## 2020-06-07 NOTE — Assessment & Plan Note (Signed)
Improved when she resumed Trazodone 100 mg. No changes in current management. We discussed the risk of interaction with Paroxetine. Good sleep hygiene.

## 2020-06-07 NOTE — Assessment & Plan Note (Addendum)
Close to her baseline. Continue continues O2 saturation 2 LPM. No changes in current management. She ahs appt with pulmonologist.

## 2020-06-07 NOTE — Telephone Encounter (Signed)
I called and spoke with patient. Her oxygen level had risen up to 94% when I called. Patient stated that this has been an ongoing issue and is partially why she was in the hospital for so long. She wanted to know if we could do her visit virtually since she is still not feeling well, does not feel like she can drive. Appointment changed to virtual, pt's vitals had improved upon return call from this clinic [place].

## 2020-06-07 NOTE — Assessment & Plan Note (Addendum)
She is not on BB,diuretic,ACEI,or ARB because hypotension. Continue monitoring wt but in the morning with gown. Instructed about warning signs. She has appt with cardiologist.

## 2020-06-07 NOTE — Assessment & Plan Note (Signed)
Explained that she is not noticing major differences in mood and anxiety because she has taken it for short time. Recommend to complete 6-8 weeks and monitor for changes before we determine that is not helping. Paroxetine can interact with Trazodone, so recommend decreasing dose from 20 mg to 10 mg. No changes in Lorazepam dose.

## 2020-06-07 NOTE — Assessment & Plan Note (Signed)
Thought to be contributing factor to cough. Continue Protonix 40 mg Am and Pepcid 40 mg at bedtime. GERD precautions.

## 2020-06-07 NOTE — Telephone Encounter (Signed)
Patient just go out of the hospital for 8 days.  She states her oxygen level is 86.  Pt does not wish to talk to triage nurse and will try to come to her appt at 2:30.  She does not want to go back to the ED either.

## 2020-06-07 NOTE — Progress Notes (Signed)
Virtual Visit via Video Note I connected with [name] on 06/07/20 by a video enabled telemedicine application and verified that I am speaking with the correct person using two identifiers.  Location patient: home Location provider:work office Persons participating in the virtual visit: patient and her son + provider Because hearing loss, her son helps me with interrogation.  I discussed the limitations of evaluation and management by telemedicine and the availability of in person appointments. The patient expressed understanding and agreed to proceed.  HPI: [name] is a 74 yo female with hx of COPD,GERD,insomnia,depression,PAD,chronic mesenteric ischemia,and hearing loss among some following on recent hospitalization. She was admitted on 05/20/20 and discharged home on 05/29/20. Presented to the ER with worsening SOB. Dx'ed with COPD exacerbation, treated with steroids and abx.  Persistent wheezing during hospitalization. Esophagogram showed esophageal dysmotility, so she was started on Protonix 40 mg and Pepcid 40 mg at bedtime. . Acute on chronic respiratory failure with hypoxia.She is checking pulse Ox at home and sometimes it is 86% for a few minutes. Most in the low 90%. She is on continues supplemental O2, 2 LPM. She can not try higher dose with the type pf O2 tank she has. She is feeling "a little" better. Cough has improved and she is able to sleep better. Negative for hemoptysis.  She is on Duoneb q 4-6 hours prn and Trelegy Ellipta 200-62.5-25 mg q 12 hours.  HFrEF: Wt 'fluctuates", "up and down." In the morning is down and pm is up. She is weighing morning and night. Negative for orthopnea,PND,or edema.  Before discharge she was started on Paroxetine 20 mg to help with anxiety and depression. She has not noted major difference. Resumed Trazodone 100 mg at bedtime, she could not sleep well during the time she was off. She is on Lorazepam 2 mg at bedtime.  She is having PT  2 times per week. Independent for most ADL"s. According to pt,next week PT will be decreased to 1/week.  Echo on 05/21/20 LVEF 45-50%, global hypokinesis.  Lab Results  Component Value Date   CREATININE 0.64 05/28/2020   BUN 16 05/28/2020   NA 131 (L) 05/28/2020   K 3.9 05/28/2020   CL 97 (L) 05/28/2020   CO2 26 05/28/2020   She has not had fever, changes in appetite (has not been good for years),headache,sore throat,CP,palpitations,abdominal pain,N/V,focal neurologic deficit. No new urinary symptoms.  Hypotension: BP "up and down", it is usually good in the morning. She has had this problem for years. Intermittent episodes of dizziness, usually when her BP is low.  Hypothyroidism: She is on Levothyroxine 112 mcg.  She started with constipation. She has had diarrhea for a year and is back to constipation after hospital discharge. She has not noted blood in stool. Last bowel movement this am.  Lab Results  Component Value Date   TSH 0.433 05/21/2020   She has appt with cardiologist and pulmonologist already arranged.  ROS: See pertinent positives and negatives per HPI.  Past Medical History:  Diagnosis Date  . Bilateral leg cramps   . Bladder neoplasm   . Chronic deep vein thrombosis (DVT) of right lower extremity ([place])    05/ 2018  chronic non-occlusive DVT RLE  . COPD, frequent exacerbations ([place])    03-06-2018 per pt last exacerbation 12/ 2018  . Coronary artery disease    cardiologist-  dr Aundra Dubin-- 03-11-2018 er cath , 80% stenosis in the ostial second diagonal  . Dyspnea on minimal exertion   .  Emphysema/COPD ([place])    CAT score- 17  . Family history of breast cancer   . Family history of colon cancer   . Family history of melanoma   . Family history of ovarian cancer   . Family history of pancreatic cancer   . Fibromyalgia 1995  . GERD (gastroesophageal reflux disease)   . Hiatal hernia   . History of breast cancer   . History of diverticulitis of colon     2002  s/p  sigmoid colectomy  . History of multiple pulmonary nodules    hx RUL nodules x2  s/p right VATS w/ wedge resection 09-11-2005 and 06-14-2011  both  necrotizing granulomatous inflammation w/ cystic area of necrosis & focal calcification  . History of right breast cancer oncologist-  dr Jana Hakim-- no recurrence   dx 2010--  IDC, Stage IA , ER/PR+,  (pT1c pN0) 11-09-2008 right lumpectomy;  12-18-2008 right simple mastectomy for DCIS margins;  completed chemotherapy 2010 (no radiation) and completed antiestrogen therapy  . Hx of colonic polyps    Dr. Cristina Gong -last study '11  . Hyperlipidemia   . Hypertension   . Hypothyroidism   . 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 Nocturia   . OA (osteoarthritis)    thumbs  . On supplemental oxygen therapy    03-06-2018 per pt uses only at night,  checks O2 sats at home,  stated am sat 89% after moving around average 93-94% with RA  . Osteoporosis   . Peripheral arterial occlusive disease [facility]) vascular-- dr chen/ dr Donzetta Matters   proximal right SFA severe focal stenosis with collaterals from the PFA/  04/ 2012 occluded celiac and SMA arteries with distal reconstitution w/ patent IMA  . Peripheral vascular disease ([place])    chronic DVT RLE,  mesenteric vascular disease  . Right lower lobe pulmonary nodule    Chest CT 08-29-2017  . Stage 3 severe COPD by GOLD classification ([place]) hx frequent exacerbations--   pulmologist-  dr Maryann Alar--  per lov note , dated 12-20-2017, oxyogen 2L is prescribed for use with exertion (but pt only uses mostly at night), O2 sats on RA run the 80s , this day sat 86% RA and with 2L O2 sat 92%  . Varicose veins of leg with swelling    varicose vein surgery - Dr. Aleda Grana  . Wears glasses   . Wears hearing aid in both ears     Past Surgical History:  Procedure Laterality Date  . BREAST EXCISIONAL BIOPSY Left 10/15/2008  . BREAST LUMPECTOMY W/ NEEDLE LOCALIZATION Right 11-09-2008     dr Brantley Stage   [facility]  . CARDIAC CATHETERIZATION  03-12-2011   dr Aundra Dubin   70-80% ostial stenosis in small second diagonal (appears to small for intervention),  dLAD 40-50%,  minimal luminal irregulartieis involoving LCFx and RCA,  LVEF 55%  . CATARACT EXTRACTION W/ INTRAOCULAR LENS  IMPLANT, BILATERAL  2011  . CYSTOSCOPY N/A 03/11/2018   Procedure: CYSTOSCOPY WITH INSTILLATION OF POST OPERATIVE EPIRUBICIN;  Surgeon: Festus Aloe, MD;  Location: [facility];  Service: Urology;  Laterality: N/A;  . LEFT HEART CATH AND CORONARY ANGIOGRAPHY N/A 05/25/2020   Procedure: LEFT HEART CATH AND CORONARY ANGIOGRAPHY;  Surgeon: Troy Sine, MD;  Location: [facility];  Service: Cardiovascular;  Laterality: N/A;  . MASTECTOMY Right   . PARTIAL COLECTOMY  2002   sigmoid and appectomy (diverticulitis)  . PARTIAL MASTECTOMY WITH NEEDLE LOCALIZATION Left 02/23/2013  Procedure:  LEFT PARTIAL MASTECTOMY WITH NEEDLE LOCALIZATION;  Surgeon: Adin Hector, MD;  Location: [place];  Service: General;  Laterality: Left;  . RECONSTRUCTION BREAST W/ LATISSIMUS DORSI FLAP Right 05-30-2009   dr Harlow Mares  [facility]  . REDUCTION MAMMAPLASTY Left   . SIMPLE MASTECTOMY Right 12-28-2008    dr Brantley Stage  [facility]  . TRANSTHORACIC ECHOCARDIOGRAM  07-09-2016  dr Aundra Dubin   ef 55-60%, grade 1 diastoic dysfunction/  mild TR  . TRANSURETHRAL RESECTION OF BLADDER TUMOR N/A 03/11/2018   Procedure: TRANSURETHRAL RESECTION OF BLADDER TUMOR (TURBT) 2-5cm;  Surgeon: Festus Aloe, MD;  Location: [facility];  Service: Urology;  Laterality: N/A;  . VAGINAL HYSTERECTOMY  1973  . VIDEO ASSISTED THORACOSCOPY (VATS)/WEDGE RESECTION Right 09-11-2005  &  06-14-2011   dr Fara Boros  [facility]   both RUL    Family History  Problem Relation Age of Onset  . Colon cancer Mother        colon  . COPD Mother        brown lung  . Hypertension Mother   . Diabetes Mother   . Emphysema Mother   . Coronary artery disease Father   . Heart  attack Father   . Sudden death Father   . Heart disease Father   . Cancer Sister        throat  . Hypertension Sister   . Coronary artery disease Brother   . Heart attack Brother        early 57s  . Throat cancer Sister   . Ovarian cancer Sister        fallopian tube cancer in her 59s  . Melanoma Sister   . Hypertension Sister   . Pancreatic cancer Sister   . Melanoma Niece        dx in her 46s  . Breast cancer Niece 72    Social History   Socioeconomic History  . Marital status: Married    Spouse name: Not on file  . Number of children: 3  . Years of education: 79  . Highest education level: Not on file  Occupational History  . Occupation: Chiropractor: RETIRED    Comment: retired  Tobacco Use  . Smoking status: Former Smoker    Packs/day: 0.75    Years: 50.00    Pack years: 37.50    Types: Cigarettes    Quit date: 05/18/2016    Years since quitting: 4.0  . Smokeless tobacco: Never Used  Vaping Use  . Vaping Use: Never used  Substance and Sexual Activity  . Alcohol use: Yes    Alcohol/week: 10.0 standard drinks    Types: 10 Cans of beer per week  . Drug use: No  . Sexual activity: Not on file  Other Topics Concern  . Not on file  Social History Narrative   HSG, beauty school. Married '69 -18 yrs/widowed; married '79 - 47yr/divorced; married '87.  2 sons - '70, '74, 1 srtep-son; 1 grandchild. 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 SO - good health.  NO history of abuse.  ACP - full code and all heroic measures.    Social Determinants of Health   Financial Resource Strain:   . Difficulty of Paying Living Expenses: Not on file  Food Insecurity:   . Worried About Charity fundraiser in the Last Year: Not on file  . Ran Out of Food in the Last Year: Not on file  Transportation Needs:   .  Lack of Transportation (Medical): Not on file  . Lack of Transportation (Non-Medical): Not on file  Physical Activity:   . Days of Exercise per Week: Not on file  .  Minutes of Exercise per Session: Not on file  Stress:   . Feeling of Stress : Not on file  Social Connections:   . Frequency of Communication with Friends and Family: Not on file  . Frequency of Social Gatherings with Friends and Family: Not on file  . Attends Religious Services: Not on file  . Active Member of Clubs or Organizations: Not on file  . Attends Archivist Meetings: Not on file  . Marital Status: Not on file  Intimate Partner Violence:   . Fear of Current or Ex-Partner: Not on file  . Emotionally Abused: Not on file  . Physically Abused: Not on file  . Sexually Abused: Not on file    Current Outpatient Medications:  .  albuterol (VENTOLIN HFA) 108 (90 Base) MCG/ACT inhaler, Inhale 2 puffs into the lungs every 6 (six) hours as needed for wheezing or shortness of breath., Disp: 28 g, Rfl: 3 .  aspirin EC 81 MG tablet, Take 81 mg by mouth daily., Disp: , Rfl:  .  atorvastatin (LIPITOR) 80 MG tablet, TAKE 1 TABLET BY MOUTH EVERY DAY, Disp: 90 tablet, Rfl: 1 .  benzonatate (TESSALON PERLES) 100 MG capsule, Take 1 capsule (100 mg total) by mouth 3 (three) times daily as needed for cough., Disp: 30 capsule, Rfl: 0 .  cilostazol (PLETAL) 100 MG tablet, Take 1 tablet (100 mg total) by mouth 2 (two) times daily., Disp: 60 tablet, Rfl: 11 .  dextromethorphan-guaiFENesin (MUCINEX DM) 30-600 MG 12hr tablet, Take 1 tablet by mouth 2 (two) times daily., Disp: , Rfl:  .  famotidine (PEPCID) 40 MG tablet, Take 1 tablet (40 mg total) by mouth at bedtime., Disp: 30 tablet, Rfl: 0 .  fluticasone (FLONASE) 50 MCG/ACT nasal spray, USE 1 SPRAY INTO EACH NOSTRIL TWICE A DAY, Disp: 48 mL, Rfl: 1 .  Fluticasone-Umeclidin-Vilant (TRELEGY ELLIPTA) 200-62.5-25 MCG/INH AEPB, Inhale 1 puff into the lungs daily., Disp: 60 each, Rfl: 5 .  ipratropium-albuterol (DUONEB) 0.5-2.5 (3) MG/3ML SOLN, Inhale 3 mLs into the lungs every 6 (six) hours as needed (sob, wheezing)., Disp: 360 mL, Rfl: 6 .   levothyroxine (SYNTHROID) 112 MCG tablet, TAKE 1 TABLET (112 MCG TOTAL) BY MOUTH DAILY BEFORE BREAKFAST., Disp: 90 tablet, Rfl: 1 .  LORazepam (ATIVAN) 2 MG tablet, Take 1 tablet (2 mg total) by mouth at bedtime., Disp: 30 tablet, Rfl: 2 .  montelukast (SINGULAIR) 10 MG tablet, Take 1 tablet (10 mg total) by mouth at bedtime., Disp: 90 tablet, Rfl: 3 .  pantoprazole (PROTONIX) 40 MG tablet, Take 1 tablet (40 mg total) by mouth daily., Disp: 30 tablet, Rfl: 1 .  PARoxetine (PAXIL) 20 MG tablet, Take 0.5 tablets (10 mg total) by mouth at bedtime., Disp: 30 tablet, Rfl: 1 .  traZODone (DESYREL) 100 MG tablet, Take 100 mg by mouth at bedtime., Disp: , Rfl:   EXAM:  VITALS per patient if applicable:Ht 5\' 6"  (1.676 m)   SpO2 96% Comment: O2 supplementation 2 LPM  BMI 21.71 kg/m   GENERAL: alert, oriented, appears well and in no acute distress  HEENT: atraumatic, conjunctiva clear, no obvious abnormalities on inspection.  NECK: normal movements of the head and neck  LUNGS: on inspection no signs of respiratory distress but she has tachypnea at times,no gasping or wheezing Cough  a couple times during visit.  CV: no obvious cyanosis  PSYCH/NEURO: pleasant and cooperative, no obvious depression, + anxious, speech and thought processing grossly intact  ASSESSMENT AND PLAN:  Discussed the following assessment and plan:  Chronic respiratory failure with hypoxia ([place]) - Plan: Ambulatory referral to [place] She seems to be close top her baseline. She has appt with her pulmonologist.  Insomnia, unspecified type - Plan: LORazepam (ATIVAN) 2 MG tablet  Hypotension, unspecified hypotension type - Plan: Cortisol, free, Serum, BASIC METABOLIC PANEL WITH GFR, CANCELED: Cortisol, free, Serum  HFrEF (heart failure with reduced ejection fraction) ([place]) - Plan: Ambulatory referral to [place] disorder, unspecified type - Plan: LORazepam (ATIVAN) 2 MG tablet  Hyponatremia - Plan:  BASIC METABOLIC PANEL WITH GFR  Insomnia Improved when she resumed Trazodone 100 mg. No changes in current management. We discussed the risk of interaction with Paroxetine. Good sleep hygiene.   GERD Thought to be contributing factor to cough. Continue Protonix 40 mg Am and Pepcid 40 mg at bedtime. GERD precautions.   Anxiety disorder Explained that she is not noticing major differences in mood and anxiety because she has taken it for short time. Recommend to complete 6-8 weeks and monitor for changes before we determine that is not helping. Paroxetine can interact with Trazodone, so recommend decreasing dose from 20 mg to 10 mg. No changes in Lorazepam dose.  HFrEF (heart failure with reduced ejection fraction) ([place]) She is not on BB,diuretic,ACEI,or ARB because hypotension. Continue monitoring wt but in the morning with gown. Instructed about warning signs. She has appt with cardiologist.  Chronic respiratory failure with hypoxia ([place]) Close to her baseline. Continue continues O2 saturation 2 LPM. No changes in current management. She ahs appt with pulmonologist.  Hyponatremia Placed lab orders to be add to next blood work. Some of her meds could aggravate problem.  I discussed the assessment and treatment plan with the patient. Christy Harbeck was provided an opportunity to ask questions and all were answered. She agreed with the plan and demonstrated an understanding of the instructions. I tried to address plan of care,  end of life planning.This is difficult to do on video visit. She understand her prognosis is fair. She does not want to go back to the hospital. She agrees with arranging palliative care.  The patient was advised to call back or seek an in-person evaluation if the symptoms worsen or if the condition fails to improve as anticipated.  Return in about 6 weeks (around 07/19/2020).   Cena Bruhn [place], MD

## 2020-06-08 ENCOUNTER — Encounter: Payer: Self-pay | Admitting: Nurse Practitioner

## 2020-06-08 ENCOUNTER — Ambulatory Visit: Payer: Medicare Other | Admitting: Nurse Practitioner

## 2020-06-08 ENCOUNTER — Other Ambulatory Visit: Payer: Self-pay

## 2020-06-08 VITALS — Ht 66.0 in | Wt 137.8 lb

## 2020-06-08 DIAGNOSIS — K551 Chronic vascular disorders of intestine: Secondary | ICD-10-CM

## 2020-06-08 DIAGNOSIS — R0600 Dyspnea, unspecified: Secondary | ICD-10-CM

## 2020-06-08 DIAGNOSIS — I739 Peripheral vascular disease, unspecified: Secondary | ICD-10-CM

## 2020-06-08 DIAGNOSIS — I259 Chronic ischemic heart disease, unspecified: Secondary | ICD-10-CM | POA: Diagnosis not present

## 2020-06-08 DIAGNOSIS — R0609 Other forms of dyspnea: Secondary | ICD-10-CM

## 2020-06-08 NOTE — Patient Instructions (Addendum)
After Visit Summary:  We will be checking the following labs today - BMET, CBC   Medication Instructions:    Continue with your current medicines.    If you need a refill on your cardiac medications before your next appointment, please call your pharmacy.     Testing/Procedures To Be Arranged:  N/A  Follow-Up:   See me in 4 months  We will get you a follow up with Dr. Donzetta Matters at [facility] - it is time for your follow up.     At [facility], you and your health needs are our priority.  As part of our continuing mission to provide you with exceptional heart care, we have created designated Provider Care Teams.  These Care Teams include your primary Cardiologist (physician) and Advanced Practice Providers (APPs -  Physician Assistants and Nurse Practitioners) who all work together to provide you with the care you need, when you need it.  Special Instructions:  . Stay safe, wash your hands for at least 20 seconds and wear a mask when needed.  . It was good to talk with you today.    Call the [place] office at [phone] if you have any questions, problems or concerns.

## 2020-06-09 ENCOUNTER — Encounter: Payer: Medicare Other | Admitting: Physical Therapy

## 2020-06-09 LAB — CBC
Hematocrit: 44.5 % (ref 34.0–46.6)
Hemoglobin: 15.3 g/dL (ref 11.1–15.9)
MCH: 31.1 pg (ref 26.6–33.0)
MCHC: 34.4 g/dL (ref 31.5–35.7)
MCV: 90 fL (ref 79–97)
Platelets: 322 10*3/uL (ref 150–450)
RBC: 4.92 x10E6/uL (ref 3.77–5.28)
RDW: 13 % (ref 11.7–15.4)
WBC: 7.9 10*3/uL (ref 3.4–10.8)

## 2020-06-09 LAB — BASIC METABOLIC PANEL
BUN/Creatinine Ratio: 13 (ref 12–28)
BUN: 13 mg/dL (ref 8–27)
CO2: 25 mmol/L (ref 20–29)
Calcium: 9.5 mg/dL (ref 8.7–10.3)
Chloride: 101 mmol/L (ref 96–106)
Creatinine, Ser: 0.99 mg/dL (ref 0.57–1.00)
GFR calc Af Amer: 64 mL/min/{1.73_m2} (ref >59)
GFR calc non Af Amer: 56 mL/min/{1.73_m2} — ABNORMAL LOW (ref >59)
Glucose: 95 mg/dL (ref 65–99)
Potassium: 4.5 mmol/L (ref 3.5–5.2)
Sodium: 138 mmol/L (ref 134–144)

## 2020-06-09 NOTE — Telephone Encounter (Signed)
Coralee Pesa from [facility] called to report the pt's weight of 137.4. She sees it is above the pt's parameters but it has gone down since her last weight report which was 139.   Coralee Pesa can be reached at [phone]

## 2020-06-10 NOTE — Telephone Encounter (Signed)
fyi

## 2020-06-13 ENCOUNTER — Inpatient Hospital Stay: Payer: Medicare Other | Admitting: Primary Care

## 2020-06-14 ENCOUNTER — Encounter: Payer: Medicare Other | Admitting: Physical Therapy

## 2020-06-16 ENCOUNTER — Ambulatory Visit (INDEPENDENT_AMBULATORY_CARE_PROVIDER_SITE_OTHER): Payer: Medicare Other | Admitting: Physician Assistant

## 2020-06-16 ENCOUNTER — Other Ambulatory Visit: Payer: Self-pay

## 2020-06-16 ENCOUNTER — Encounter: Payer: Medicare Other | Admitting: Physical Therapy

## 2020-06-16 ENCOUNTER — Ambulatory Visit (HOSPITAL_COMMUNITY)
Admission: RE | Admit: 2020-06-16 | Discharge: 2020-06-16 | Disposition: A | Discharge status: Home / Self Care | Payer: Medicare Other | Source: Ambulatory Visit | Attending: Family Medicine | Admitting: Family Medicine

## 2020-06-16 ENCOUNTER — Ambulatory Visit (INDEPENDENT_AMBULATORY_CARE_PROVIDER_SITE_OTHER)
Admission: RE | Admit: 2020-06-16 | Discharge: 2020-06-16 | Disposition: A | Discharge status: Home / Self Care | Payer: Medicare Other | Source: Ambulatory Visit | Attending: Nurse Practitioner | Admitting: Nurse Practitioner

## 2020-06-16 VITALS — BP 124/73 | HR 63 | Temp 98.0°F | Resp 20 | Ht 66.0 in | Wt 137.8 lb

## 2020-06-16 DIAGNOSIS — K551 Chronic vascular disorders of intestine: Secondary | ICD-10-CM | POA: Diagnosis present

## 2020-06-16 DIAGNOSIS — G458 Other transient cerebral ischemic attacks and related syndromes: Secondary | ICD-10-CM | POA: Diagnosis not present

## 2020-06-16 DIAGNOSIS — I739 Peripheral vascular disease, unspecified: Secondary | ICD-10-CM | POA: Insufficient documentation

## 2020-06-16 NOTE — Progress Notes (Signed)
Established Mesenteric Ischemia   History of Present Illness   [name] is a 74 y.o. 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   She had been worked up for mesenteric bypass surgery by Dr. Bridgett Larsson in the past.  She has known occlusion of celiac and SMA arteries based on angiography.  She has been followed by Dr. Donzetta Matters who would consider repeat mesenteric angiography if symptoms of postprandial pain or weight loss due to fear of food returned.  Patient states she is still without any abdominal pain or postprandial pain and has not experienced any weight loss.  She has had some constipation however has had moderate success with a stool softener.  Patient was also recently hospitalized due to worsening dyspnea on exertion.  She was treated for suspected COPD exacerbation.  Work-up also included echo which demonstrated a decreased EF.  She underwent cardiac catheterization which was only diagnostic in nature and no intervention was performed.  She has a follow-up next week with pulmonology.  She was seen by cardiology who noticed a significant difference in blood pressure between arms.  She describes having dizzy spells at rest and with exertion.  She also notices her left arm becoming fatigued quicker than her right with activity.  Today she has a 60 mmHg difference in systolic blood pressure between arms.  The patient's PMH, PSH, SH, and FamHx were reviewed and are unchanged from prior visit.  Current Outpatient Medications  Medication Sig Dispense Refill  . albuterol (VENTOLIN HFA) 108 (90 Base) MCG/ACT inhaler Inhale 2 puffs into the lungs every 6 (six) hours as needed for wheezing or shortness of breath. 28 g 3  . aspirin EC 81 MG tablet Take 81 mg by mouth daily.    Marland Kitchen atorvastatin (LIPITOR) 80 MG tablet TAKE 1 TABLET BY MOUTH EVERY DAY 90 tablet 1  . benzonatate (TESSALON PERLES) 100 MG capsule Take 1 capsule  (100 mg total) by mouth 3 (three) times daily as needed for cough. 30 capsule 0  . cilostazol (PLETAL) 100 MG tablet Take 1 tablet (100 mg total) by mouth 2 (two) times daily. 60 tablet 11  . dextromethorphan-guaiFENesin (MUCINEX DM) 30-600 MG 12hr tablet Take 1 tablet by mouth 2 (two) times daily.    . famotidine (PEPCID) 40 MG tablet Take 1 tablet (40 mg total) by mouth at bedtime. 30 tablet 0  . fluticasone (FLONASE) 50 MCG/ACT nasal spray USE 1 SPRAY INTO EACH NOSTRIL TWICE A DAY 48 mL 1  . Fluticasone-Umeclidin-Vilant (TRELEGY ELLIPTA) 200-62.5-25 MCG/INH AEPB Inhale 1 puff into the lungs daily. 60 each 5  . ipratropium-albuterol (DUONEB) 0.5-2.5 (3) MG/3ML SOLN Inhale 3 mLs into the lungs every 6 (six) hours as needed (sob, wheezing). 360 mL 6  . levothyroxine (SYNTHROID) 112 MCG tablet TAKE 1 TABLET (112 MCG TOTAL) BY MOUTH DAILY BEFORE BREAKFAST. 90 tablet 1  . LORazepam (ATIVAN) 2 MG tablet Take 1 tablet (2 mg total) by mouth at bedtime. 30 tablet 2  . montelukast (SINGULAIR) 10 MG tablet Take 1 tablet (10 mg total) by mouth at bedtime. 90 tablet 3  . pantoprazole (PROTONIX) 40 MG tablet Take 1 tablet (40 mg total) by mouth daily. 30 tablet 1  . PARoxetine (PAXIL) 20 MG tablet Take 0.5 tablets (10 mg total) by mouth at bedtime. 30 tablet 1  . traZODone (DESYREL) 100 MG tablet Take 100 mg by mouth at bedtime.  No current facility-administered medications for this visit.    REVIEW OF SYSTEMS (negative unless checked):   Cardiac:  []  Chest pain or chest pressure? []  Shortness of breath upon activity? []  Shortness of breath when lying flat? []  Irregular heart rhythm?  Vascular:  []  Pain in calf, thigh, or hip brought on by walking? []  Pain in feet at night that wakes you up from your sleep? []  Blood clot in your veins? []  Leg swelling?  Pulmonary:  []  Oxygen at home? []  Productive cough? []  Wheezing?  Neurologic:  []  Sudden weakness in arms or legs? []  Sudden numbness in  arms or legs? []  Sudden onset of difficult speaking or slurred speech? []  Temporary loss of vision in one eye? []  Problems with dizziness?  Gastrointestinal:  []  Blood in stool? []  Vomited blood?  Genitourinary:  []  Burning when urinating? []  Blood in urine?  Psychiatric:  []  Major depression  Hematologic:  []  Bleeding problems? []  Problems with blood clotting?  Dermatologic:  []  Rashes or ulcers?  Constitutional:  []  Fever or chills?  Ear/Nose/Throat:  []  Change in hearing? []  Nose bleeds? []  Sore throat?  Musculoskeletal:  []  Back pain? []  Joint pain? []  Muscle pain?   Physical Examination   Vitals:   06/16/20 1009  BP: 124/73  Pulse: 63  Resp: 20  Temp: 98 F (36.7 C)  TempSrc: Temporal  SpO2: 94%  Weight: 137 lb 12.8 oz (62.5 kg)  Height: 5\' 6"  (1.676 m)   Body mass index is 22.24 kg/m.  General:  WDWN in NAD; vital signs documented above Gait: Not observed HENT: WNL, normocephalic Pulmonary: normal non-labored breathing , without Rales, rhonchi,  wheezing Cardiac: regular HR, Abdomen: soft, NT, no masses Skin: without rashes Vascular Exam/Pulses:  Right Left  Radial 1+ (weak) 2+ (normal)  DP absent absent  PT absent absent   Extremities: without ischemic changes, without Gangrene , without cellulitis; without open wounds;  Musculoskeletal: no muscle wasting or atrophy  Neurologic: A&O X 3;  No focal weakness or paresthesias are detected Psychiatric:  The pt has Normal affect.  Non-Invasive Vascular Imaging   Mesenteric Duplex :   Known proximal occlusions of celiac and SMA which reconstitute distally via collaterals  IMA was not visualized today   Medical Decision Making   [name] is a 74 y.o. 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 Oneida Alar who recommended carotid duplex to evaluate for concurrent left carotid stenosis and for follow-up with Dr. Donzetta Matters for consideration of possible carotid subclavian bypass versus percutaneous intervention   Dagoberto Ligas PA-C [facility] Office: [phone]  Clinic MD: Oneida Alar

## 2020-06-17 ENCOUNTER — Other Ambulatory Visit: Payer: Self-pay

## 2020-06-17 ENCOUNTER — Telehealth: Payer: Self-pay | Admitting: Nurse Practitioner

## 2020-06-17 DIAGNOSIS — I6522 Occlusion and stenosis of left carotid artery: Secondary | ICD-10-CM

## 2020-06-17 NOTE — Telephone Encounter (Signed)
Spoke with patient regarding Palliative services and she has declined services at this time.  She doesn't feel that she needs our services.  She was in agreement with me cancelling the referral and notifying MD office of this.  I did instruct her that if she changed her mind in the future to please contact her MD and they could send [place] another referral and she was in agreement with this.

## 2020-06-19 ENCOUNTER — Other Ambulatory Visit: Payer: Self-pay | Admitting: Family Medicine

## 2020-06-19 DIAGNOSIS — E039 Hypothyroidism, unspecified: Secondary | ICD-10-CM

## 2020-06-21 ENCOUNTER — Ambulatory Visit: Payer: Medicare Other | Admitting: Primary Care

## 2020-06-21 ENCOUNTER — Encounter: Payer: Medicare Other | Admitting: Physical Therapy

## 2020-06-22 ENCOUNTER — Ambulatory Visit: Payer: Medicare Other | Admitting: Primary Care

## 2020-06-22 ENCOUNTER — Inpatient Hospital Stay: Payer: Medicare Other | Admitting: Acute Care

## 2020-06-22 ENCOUNTER — Encounter: Payer: Self-pay | Admitting: Primary Care

## 2020-06-22 ENCOUNTER — Other Ambulatory Visit: Payer: Self-pay

## 2020-06-22 VITALS — BP 106/64 | HR 72 | Ht 66.0 in | Wt 136.0 lb

## 2020-06-22 DIAGNOSIS — R0683 Snoring: Secondary | ICD-10-CM

## 2020-06-22 DIAGNOSIS — J441 Chronic obstructive pulmonary disease with (acute) exacerbation: Secondary | ICD-10-CM | POA: Diagnosis not present

## 2020-06-22 DIAGNOSIS — J9611 Chronic respiratory failure with hypoxia: Secondary | ICD-10-CM | POA: Diagnosis not present

## 2020-06-22 DIAGNOSIS — K219 Gastro-esophageal reflux disease without esophagitis: Secondary | ICD-10-CM | POA: Diagnosis not present

## 2020-06-22 NOTE — Patient Instructions (Addendum)
Nice seeing you today [name], I am glad you are doing so well. No changes today except we are checking sleep study   Orders: Split night sleep study re: snoring, chronic respiratory failure   Recommendations: Continue Trelegy 1 puff daily Continue Albuterol rescue inhaler 2 puffs every 6 hours as needed  Continue Protonix daily Continue Pepcid at bedtime  Continue Singulair 10mg  at bedtime   Follow-up: 2-3 months with DR. Ramaswamy or sooner if symptoms worsen

## 2020-06-22 NOTE — Progress Notes (Signed)
@Patient  ID: [name], female    DOB: 1946-09-24, 74 y.o.   MRN: [id number]  Chief Complaint  Patient presents with  . Hospitalization Follow-up    Pt states she is doing better after recent hospital stay. Pt does still have an occassional cough with clear phlegm.    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 Patient of Dr. Chase Caller. Maintained on Trelegy 200, prn Albuterol and Singulair.   Previous [facility] pulmonary encounters: 03/18/20- Televisit, TP  Today's televisit is for an acute office visit . Complains of 2 weeks of increased cough,congestion and wheezing .gets winded easily. Breathing has not been doing as well over last 2 months since having Pneumonia in March.  Patient complains that she is now having to wear her oxygen all the time at 2 L.  O2 saturations have been good at 94 to 96%.  She denies any hemoptysis, chest pain, orthopnea, edema.  Appetite is good.  No nausea vomiting or diarrhea.  No loss of taste or smell.  Has received 1 Covid vaccine in March but has not received her second booster.  04/06/2020  Patient presents today for acute visit for cough and shortness of breath. She was treated with 5 day course of abx and felt better for a short period of time but doesn't feel she was on antibiotic for long enough. She is taking Trelegy daily, needing to use her nebulizer 3 times a day. She is taking mucinex twice a day. She has not used her flutter valve because she does not feel that it helps. She is wearing 2L oxygen. She has lost 10-15lbs since September.   04/25/2020 Breathing is a little better. Feels like she can take a deeper breath. Still experience dyspnea with activity and complains of some chest congestion. She is able to cough up more mucus. Sputum is beige. She finished levaquin course and prednisone. She takes mucinex twice and uses the flutter valve  daily. Her nose runs, she blows her nose. She uses her breathing treatments three times a day. Nocutrnal symptoms are better.  She has had four COPD flares this year.   06/22/2020 Patient presents today for hospital follow-up. She was admitted from 05/20/20-05/29/20 for AECOPD and treated with Levaquin. Wheezing felt to be related to laryngeal pharyngeal reflux. Esophagram showed small to moderate size hiatal hernia, esophageal dysmotility. Recommended to continue PPI BID/ H2 blocker. Maintained on Trelegy and Singulair. She is on 2L chronic oxygen.   Patient states that she is feeling the best she has felt in awhile. She has some cough but states that it is not like it used to be. She is now on Paxil and ativan. Reports that her mood is significantly better. She is not on any diuretics. BNP during admission was 99. She has not needed to use albuterol rescue inhaler. She report loud snoring, wakes up occasionally gasping for air. No significant daytime fatigue. She wearing 2L oxygen at night time only. She is compliant with Protonix. Denies wheezing or chest tightness.    Imaging: 11/18/18 CT chest wo contrast - Previously seen right lower lobe pulmonary nodules have resolved.These were likely inflammatory. Stable nodular disease along the left major fissure. Aortic Atherosclerosis (ICD10-I70.0). Emphysema (ICD10-J43.9).  12/01/19 CTA- Negative for acute PE or thoracic aortic dissection. Proximal left common carotid artery stenosis of possible hemodynamic significance. Proximal celiac and SMA occlusions with enlarged IMA collaterals  providing the majority of mesenteric circulation, at risk of mesenteric ischemia . Small hiatal hernia. Coronary  and  aortic Atherosclerosis (ICD10-170.0).  Emphysema (ICD10-J43.9).  Pulmonary Function testing: 10/26/16- FVC 2.47 (81%), FEV1 1.03 (44%), ratio 47 +BD Severe obstruction lung disease with reversibility  Allergies  Allergen Reactions  . Sulfa Antibiotics Swelling      Immunization History  Administered Date(s) Administered  . Fluad Quad(high Dose 65+) 08/26/2019  . Influenza, High Dose Seasonal PF 10/03/2017, 06/20/2018, 09/27/2019  . Influenza,inj,Quad PF,6+ Mos 08/10/2014, 08/12/2015, 07/03/2016  . PFIZER SARS-COV-2 Vaccination 01/06/2020  . Pneumococcal Conjugate-13 08/10/2014  . Pneumococcal Polysaccharide-23 08/12/2015  . Td 10/16/2007  . Zoster 11/09/2010    Past Medical History:  Diagnosis Date  . Bilateral leg cramps   . Bladder neoplasm   . Chronic deep vein thrombosis (DVT) of right lower extremity ([place])    05/ 2018  chronic non-occlusive DVT RLE  . COPD, frequent exacerbations ([place])    03-06-2018 per pt last exacerbation 12/ 2018  . Coronary artery disease    cardiologist-  dr Aundra Dubin-- 03-11-2018 er cath , 80% stenosis in the ostial second diagonal  . Dyspnea on minimal exertion   . Emphysema/COPD ([place])    CAT score- 17  . Family history of breast cancer   . Family history of colon cancer   . Family history of melanoma   . Family history of ovarian cancer   . Family history of pancreatic cancer   . Fibromyalgia 1995  . GERD (gastroesophageal reflux disease)   . Hiatal hernia   . History of breast cancer   . History of diverticulitis of colon    2002  s/p  sigmoid colectomy  . History of multiple pulmonary nodules    hx RUL nodules x2  s/p right VATS w/ wedge resection 09-11-2005 and 06-14-2011  both  necrotizing granulomatous inflammation w/ cystic area of necrosis & focal calcification  . History of right breast cancer oncologist-  dr Jana Hakim-- no recurrence   dx 2010--  IDC, Stage IA , ER/PR+,  (pT1c pN0) 11-09-2008 right lumpectomy;  12-18-2008 right simple mastectomy for DCIS margins;  completed chemotherapy 2010 (no radiation) and completed antiestrogen therapy  . Hx of colonic polyps    Dr. Cristina Gong -last study '11  . Hyperlipidemia   . Hypertension   . Hypothyroidism   . 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 Nocturia   . OA (osteoarthritis)    thumbs  . On supplemental oxygen therapy    03-06-2018 per pt uses only at night,  checks O2 sats at home,  stated am sat 89% after moving around average 93-94% with RA  . Osteoporosis   . Peripheral arterial occlusive disease [facility]) vascular-- dr chen/ dr Donzetta Matters   proximal right SFA severe focal stenosis with collaterals from the PFA/  04/ 2012 occluded celiac and SMA arteries with distal reconstitution w/ patent IMA  . Peripheral vascular disease ([place])    chronic DVT RLE,  mesenteric vascular disease  . Right lower lobe pulmonary nodule    Chest CT 08-29-2017  . Stage 3 severe COPD by GOLD classification ([place]) hx frequent exacerbations--   pulmologist-  dr Maryann Alar--  per lov note , dated 12-20-2017, oxyogen 2L is prescribed for use with exertion (but pt only uses mostly at night), O2 sats on RA run the 80s , this day sat 86% RA and with 2L O2 sat 92%  . Varicose veins of  leg with swelling    varicose vein surgery - Dr. Aleda Grana  . Wears glasses   . Wears hearing aid in both ears     Tobacco History: Social History   Tobacco Use  Smoking Status Former Smoker  . Packs/day: 0.75  . Years: 50.00  . Pack years: 37.50  . Types: Cigarettes  . Quit date: 05/18/2016  . Years since quitting: 4.1  Smokeless Tobacco Never Used   Counseling given: Not Answered   Outpatient Medications Prior to Visit  Medication Sig Dispense Refill  . albuterol (VENTOLIN HFA) 108 (90 Base) MCG/ACT inhaler Inhale 2 puffs into the lungs every 6 (six) hours as needed for wheezing or shortness of breath. 28 g 3  . aspirin EC 81 MG tablet Take 81 mg by mouth daily.    Marland Kitchen atorvastatin (LIPITOR) 80 MG tablet TAKE 1 TABLET BY MOUTH EVERY DAY 90 tablet 1  . benzonatate (TESSALON PERLES) 100 MG capsule Take 1 capsule (100 mg total) by mouth 3 (three) times daily as needed for cough. 30 capsule 0  . cilostazol (PLETAL) 100 MG tablet Take 1  tablet (100 mg total) by mouth 2 (two) times daily. 60 tablet 11  . dextromethorphan-guaiFENesin (MUCINEX DM) 30-600 MG 12hr tablet Take 1 tablet by mouth 2 (two) times daily.    . famotidine (PEPCID) 40 MG tablet Take 1 tablet (40 mg total) by mouth at bedtime. 30 tablet 0  . fluticasone (FLONASE) 50 MCG/ACT nasal spray USE 1 SPRAY INTO EACH NOSTRIL TWICE A DAY 48 mL 1  . Fluticasone-Umeclidin-Vilant (TRELEGY ELLIPTA) 200-62.5-25 MCG/INH AEPB Inhale 1 puff into the lungs daily. 60 each 5  . ipratropium-albuterol (DUONEB) 0.5-2.5 (3) MG/3ML SOLN Inhale 3 mLs into the lungs every 6 (six) hours as needed (sob, wheezing). 360 mL 6  . levothyroxine (SYNTHROID) 112 MCG tablet TAKE 1 TABLET (112 MCG TOTAL) BY MOUTH DAILY BEFORE BREAKFAST. 90 tablet 1  . LORazepam (ATIVAN) 2 MG tablet Take 1 tablet (2 mg total) by mouth at bedtime. 30 tablet 2  . montelukast (SINGULAIR) 10 MG tablet Take 1 tablet (10 mg total) by mouth at bedtime. 90 tablet 3  . pantoprazole (PROTONIX) 40 MG tablet Take 1 tablet (40 mg total) by mouth daily. 30 tablet 1  . PARoxetine (PAXIL) 20 MG tablet Take 0.5 tablets (10 mg total) by mouth at bedtime. 30 tablet 1  . traZODone (DESYREL) 100 MG tablet Take 100 mg by mouth at bedtime.     No facility-administered medications prior to visit.    Review of Systems  Review of Systems  Constitutional: Negative.   Respiratory: Positive for cough. Negative for shortness of breath and wheezing.   Psychiatric/Behavioral: Negative.     Physical Exam  BP 106/64 (BP Location: Left Arm, Cuff Size: Normal)   Pulse 72   Ht 5\' 6"  (1.676 m)   Wt 136 lb (61.7 kg)   SpO2 96%   BMI 21.95 kg/m  Physical Exam Constitutional:      Appearance: Normal appearance.  Cardiovascular:     Rate and Rhythm: Normal rate and regular rhythm.  Pulmonary:     Effort: Pulmonary effort is normal.     Breath sounds: Normal breath sounds.  Neurological:     General: No focal deficit present.     Mental  Status: She is alert and oriented to person, place, and time. Mental status is at baseline.  Psychiatric:        Mood and Affect: Mood normal.  Behavior: Behavior normal.        Thought Content: Thought content normal.        Judgment: Judgment normal.      Lab Results:  CBC    Component Value Date/Time   WBC 7.9 06/08/2020 1510   WBC 8.9 05/28/2020 0522   RBC 4.92 06/08/2020 1510   RBC 4.36 05/28/2020 0522   HGB 15.3 06/08/2020 1510   HGB 13.7  1045   HCT 44.5 06/08/2020 1510   HCT 42.4  1045   PLT 322 06/08/2020 1510   MCV 90 06/08/2020 1510   MCV 87.8  1045   MCH 31.1 06/08/2020 1510   MCH 30.7 05/28/2020 0522   MCHC 34.4 06/08/2020 1510   MCHC 33.3 05/28/2020 0522   RDW 13.0 06/08/2020 1510   RDW 14.9 (H)  1045   LYMPHSABS 1.0 05/28/2020 0522   LYMPHSABS 1.1 01/06/2020 1732   LYMPHSABS 1.1  1045   MONOABS 1.0 05/28/2020 0522   MONOABS 0.7  1045   EOSABS 0.1 05/28/2020 0522   EOSABS 0.5 (H) 01/06/2020 1732   BASOSABS 0.0 05/28/2020 0522   BASOSABS 0.1 01/06/2020 1732   BASOSABS 0.0  1045    BMET    Component Value Date/Time   NA 138 06/08/2020 1510   NA 141 02/17/2015 1249   K 4.5 06/08/2020 1510   K 4.3 02/17/2015 1249   CL 101 06/08/2020 1510   CL 108 (H) 08/21/2012 1501   CO2 25 06/08/2020 1510   CO2 25 02/17/2015 1249   GLUCOSE 95 06/08/2020 1510   GLUCOSE 95 05/28/2020 0522   GLUCOSE 85 02/17/2015 1249   GLUCOSE 101 (H) 08/21/2012 1501   BUN 13 06/08/2020 1510   BUN 16.6 02/17/2015 1249   CREATININE 0.99 06/08/2020 1510   CREATININE 0.85 11/20/2018 1344   CREATININE 0.93 01/03/2018 1601   CREATININE 0.8 02/17/2015 1249   CALCIUM 9.5 06/08/2020 1510   CALCIUM 9.3 02/17/2015 1249   GFRNONAA 56 (L) 06/08/2020 1510   GFRNONAA >60 11/20/2018 1344   GFRAA 64 06/08/2020 1510   GFRAA >60 11/20/2018 1344    BNP    Component Value Date/Time   BNP 99.7 05/20/2020 1657     ProBNP No results found for: PROBNP  Imaging: VAS [place] DOP BILAT COMP TOS DIGITS REYNAUD  Result Date: 06/16/2020 DEFAULT [facility]   Pt. Name: ANALEAH BRAME                                     Pt. DOB: 1946-10-15         ID: [id number]                                         Pt. Age: 57 years Study Type: VAS UE DOPPLER BILAT/COMP TOS, DIGITS           Exam Date: 06/16/2020             (TO\T\UE REYNAUDS) Ref. Phys.: [name]                           Review Date: 06/16/2020  Diag Phys: Ruta Hinds MD  Modality: [place]  Indications: Marked brachial pressure difference History: Chronic mesenteric ischemia, hyperlipidemia and PAD. Current smoker Diagnosis: Impression: Right brachial pressure: 131mmHg Left brachial pressure: 178mmHg Abnormal waveforms of the left subclavian artery, more proximal obstruction likely. Summary:   Final    VAS [place] MESENTERIC  Result Date: 06/16/2020 ABDOMINAL VISCERAL Indications: Chronic mesenteric ischemia High Risk Factors: Hypertension, hyperlipidemia, past history of smoking. Limitations: Air/bowel gas. Heavy respiration, chronic coughing and snoring. Performing Technologist: Ronal Fear RVS, RCS  Examination Guidelines: A complete evaluation includes B-mode imaging, spectral Doppler, color Doppler, and power Doppler as needed of all accessible portions of each vessel. Bilateral testing is considered an integral part of a complete examination. Limited examinations for reoccurring indications may be performed as noted.  Duplex Findings: +--------------------+--------+--------+--------+------------------+ Mesenteric          PSV cm/sEDV cm/s Plaque      Comments      +--------------------+--------+--------+--------+------------------+ Aorta at SMA           51                                      +--------------------+--------+--------+--------+------------------+ Celiac Artery Origin   0            calcific                    +--------------------+--------+--------+--------+------------------+ SMA Origin             0            calcific                   +--------------------+--------+--------+--------+------------------+ SMA Proximal           0                                       +--------------------+--------+--------+--------+------------------+ SMA Mid               137                                      +--------------------+--------+--------+--------+------------------+ SMA Distal             95                                      +--------------------+--------+--------+--------+------------------+ CHA                    91                                      +--------------------+--------+--------+--------+------------------+ Splenic                88                                      +--------------------+--------+--------+--------+------------------+ IMA  unable to identify +--------------------+--------+--------+--------+------------------+    Summary: Mesenteric:  Proximal celiac and SMA occlusions with distal reconstitution via collaterals.  *See table(s) above for measurements and observations.  Diagnosing physician: Ruta Hinds MD  Electronically signed by Ruta Hinds MD on 06/16/2020 at 9:15:42 AM.    Final    VAS US CAROTID  Result Date: 06/24/2020 Carotid Arterial Duplex Study Indications:       Carotid artery disease. Other Factors:     Left subclavian disease. Comparison Study:  none Performing Technologist: June Leap RDMS, RVT  Examination Guidelines: A complete evaluation includes B-mode imaging, spectral Doppler, color Doppler, and power Doppler as needed of all accessible portions of each vessel. Bilateral testing is considered an integral part of a complete examination. Limited examinations for reoccurring indications may be performed as noted.  Right Carotid Findings:  +----------+--------+--------+--------+------------------+--------+           PSV cm/sEDV cm/sStenosisPlaque DescriptionComments +----------+--------+--------+--------+------------------+--------+ CCA Prox  57      8               heterogenous               +----------+--------+--------+--------+------------------+--------+ CCA Distal81      9               heterogenous               +----------+--------+--------+--------+------------------+--------+ ICA Prox  73      20      1-39%   heterogenous               +----------+--------+--------+--------+------------------+--------+ ICA Distal69      14                                         +----------+--------+--------+--------+------------------+--------+ ECA       128     11              heterogenous               +----------+--------+--------+--------+------------------+--------+ +----------+--------+-------+----------------+-------------------+           PSV cm/sEDV cmsDescribe        Arm Pressure (mmHG) +----------+--------+-------+----------------+-------------------+ [id number]             Multiphasic, WNL                    +----------+--------+-------+----------------+-------------------+ +---------+--------+---+--------+--+---------+ VertebralPSV cm/s104EDV cm/s14Antegrade +---------+--------+---+--------+--+---------+  Left Carotid Findings: +----------+--------+--------+--------+------------------+--------+           PSV cm/sEDV cm/sStenosisPlaque DescriptionComments +----------+--------+--------+--------+------------------+--------+ CCA Prox  62      12              heterogenous               +----------+--------+--------+--------+------------------+--------+ CCA Distal60      9               heterogenous               +----------+--------+--------+--------+------------------+--------+ ICA Prox  78      11      1-39%   heterogenous                +----------+--------+--------+--------+------------------+--------+ ICA Distal72      17                                         +----------+--------+--------+--------+------------------+--------+  ECA       107     7               heterogenous               +----------+--------+--------+--------+------------------+--------+ +----------+--------+--------+-----------------------------+-------------------+           PSV cm/sEDV cm/sDescribe                     Arm Pressure (mmHG) +----------+--------+--------+-----------------------------+-------------------+ [id number]              monophasic- indicative of                                                  proximal obstruction                             +----------+--------+--------+-----------------------------+-------------------+ +---------+--------+--+--------+----------+ VertebralPSV cm/s67EDV cm/sRetrograde +---------+--------+--+--------+----------+   Summary: Right Carotid: Velocities in the right ICA are consistent with a 1-39% stenosis. Left Carotid: Velocities in the left ICA are consistent with a 1-39% stenosis. Vertebrals:  Right vertebral artery demonstrates antegrade flow. Left vertebral              artery demonstrates retrograde flow. Subclavians: Normal flow hemodynamics were seen in the right subclavian artery.              Left subclavian waveform monophasic- indicative of a more proximal              obstruction. *See table(s) above for measurements and observations.  Electronically signed by Servando Snare MD on 06/24/2020 at 5:23:29 PM.    Final      Assessment & Plan:   Snoring - Report loud snoring, wakes up occasionally gasping for air - Split night sleep study re: snoring, chronic respiratory failure   COPD, frequent exacerbations ([place]) - Last hospitalized for AECOPD 05/20/20-05/29/20. Currently doing well. Started on medication for anxiety/depression that may have been exacerbating respiratory  symptoms - Continue Trelegy 200 1 puff daily; Albuterol rescue inhaler 2 puffs every 6 hours as needed and Singulair 10mg  at bedtime - FU in 2-3 months   GERD - Wheezing felt to be related to laryngeal pharyngeal reflux. Esophagram showed small to moderate size hiatal hernia, esophageal dysmotility - Continue Protonix daily and Pepcid at bedtime      Martyn Ehrich, NP 06/26/2020

## 2020-06-23 ENCOUNTER — Other Ambulatory Visit: Payer: Self-pay | Admitting: *Deleted

## 2020-06-23 ENCOUNTER — Encounter: Payer: Medicare Other | Admitting: Physical Therapy

## 2020-06-23 DIAGNOSIS — I6522 Occlusion and stenosis of left carotid artery: Secondary | ICD-10-CM

## 2020-06-24 ENCOUNTER — Other Ambulatory Visit: Payer: Self-pay

## 2020-06-24 ENCOUNTER — Encounter: Payer: Self-pay | Admitting: Vascular Surgery

## 2020-06-24 ENCOUNTER — Ambulatory Visit (HOSPITAL_COMMUNITY)
Admission: RE | Admit: 2020-06-24 | Discharge: 2020-06-24 | Disposition: A | Discharge status: Home / Self Care | Payer: Medicare Other | Source: Ambulatory Visit | Attending: Vascular Surgery | Admitting: Vascular Surgery

## 2020-06-24 ENCOUNTER — Ambulatory Visit (INDEPENDENT_AMBULATORY_CARE_PROVIDER_SITE_OTHER): Payer: Medicare Other | Admitting: Vascular Surgery

## 2020-06-24 VITALS — BP 108/74 | HR 66 | Temp 97.3°F | Resp 20 | Ht 66.0 in | Wt 136.0 lb

## 2020-06-24 DIAGNOSIS — G458 Other transient cerebral ischemic attacks and related syndromes: Secondary | ICD-10-CM | POA: Diagnosis not present

## 2020-06-24 DIAGNOSIS — I6522 Occlusion and stenosis of left carotid artery: Secondary | ICD-10-CM | POA: Diagnosis present

## 2020-06-24 DIAGNOSIS — K551 Chronic vascular disorders of intestine: Secondary | ICD-10-CM | POA: Diagnosis not present

## 2020-06-24 NOTE — Progress Notes (Signed)
Patient ID: [name], female   DOB: Feb 26, 1946, 75 y.o.   MRN: [id number]  Reason for Consult: Follow-up   Referred by [place], [name], MD  Subjective:     HPI:  [name] is a 74 y.o. female with history of chronic mesenteric ischemia for which she has done very well has never had intervention.  Currently her weight is stable.  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  She states she has early fatigue and left upper extremity and also occasionally has lightheadedness when using her upper extremity on the left.  She has no issues with the right upper extremity.  She denies any history of stroke TIA or amaurosis.  Past Medical History:  Diagnosis Date  . Bilateral leg cramps   . Bladder neoplasm   . Chronic deep vein thrombosis (DVT) of right lower extremity ([place])    05/ 2018  chronic non-occlusive DVT RLE  . COPD, frequent exacerbations ([place])    03-06-2018 per pt last exacerbation 12/ 2018  . Coronary artery disease    cardiologist-  dr Aundra Dubin-- 03-11-2018 er cath , 80% stenosis in the ostial second diagonal  . Dyspnea on minimal exertion   . Emphysema/COPD ([place])    CAT score- 17  . Family history of breast cancer   . Family history of colon cancer   . Family history of melanoma   . Family history of ovarian cancer   . Family history of pancreatic cancer   . Fibromyalgia 1995  . GERD (gastroesophageal reflux disease)   . Hiatal hernia   . History of breast cancer   . History of diverticulitis of colon    2002  s/p  sigmoid colectomy  . History of multiple pulmonary nodules    hx RUL nodules x2  s/p right VATS w/ wedge resection 09-11-2005 and 06-14-2011  both  necrotizing granulomatous inflammation w/ cystic area of necrosis & focal calcification  . History of right breast cancer oncologist-  dr Jana Hakim-- no recurrence   dx 2010--  IDC, Stage IA , ER/PR+,  (pT1c pN0)  11-09-2008 right lumpectomy;  12-18-2008 right simple mastectomy for DCIS margins;  completed chemotherapy 2010 (no radiation) and completed antiestrogen therapy  . Hx of colonic polyps    Dr. Cristina Gong -last study '11  . Hyperlipidemia   . Hypertension   . Hypothyroidism   . 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 Nocturia   . OA (osteoarthritis)    thumbs  . On supplemental oxygen therapy    03-06-2018 per pt uses only at night,  checks O2 sats at home,  stated am sat 89% after moving around average 93-94% with RA  . Osteoporosis   . Peripheral arterial occlusive disease [facility]) vascular-- dr chen/ dr Donzetta Matters   proximal right SFA severe focal stenosis with collaterals from the PFA/  04/ 2012 occluded celiac and SMA arteries with distal reconstitution w/ patent IMA  . Peripheral vascular disease ([place])    chronic DVT RLE,  mesenteric vascular disease  . Right lower lobe pulmonary nodule    Chest CT 08-29-2017  . Stage 3 severe COPD by GOLD classification ([place]) hx frequent exacerbations--   pulmologist-  dr Maryann Alar--  per lov note , dated 12-20-2017, oxyogen 2L is prescribed for use with exertion (but pt only uses mostly at night), O2 sats on RA run the  80s , this day sat 86% RA and with 2L O2 sat 92%  . Varicose veins of leg with swelling    varicose vein surgery - Dr. Aleda Grana  . Wears glasses   . Wears hearing aid in both ears    Family History  Problem Relation Age of Onset  . Colon cancer Mother        colon  . COPD Mother        brown lung  . Hypertension Mother   . Diabetes Mother   . Emphysema Mother   . Coronary artery disease Father   . Heart attack Father   . Sudden death Father   . Heart disease Father   . Cancer Sister        throat  . Hypertension Sister   . Coronary artery disease Brother   . Heart attack Brother        early 49s  . Throat cancer Sister   . Ovarian cancer Sister        fallopian tube cancer in her 4s  .  Melanoma Sister   . Hypertension Sister   . Pancreatic cancer Sister   . Melanoma Niece        dx in her 16s  . Breast cancer Niece 63   Past Surgical History:  Procedure Laterality Date  . BREAST EXCISIONAL BIOPSY Left 10/15/2008  . BREAST LUMPECTOMY W/ NEEDLE LOCALIZATION Right 11-09-2008    dr Brantley Stage   [facility]  . CARDIAC CATHETERIZATION  03-12-2011   dr Aundra Dubin   70-80% ostial stenosis in small second diagonal (appears to small for intervention),  dLAD 40-50%,  minimal luminal irregulartieis involoving LCFx and RCA,  LVEF 55%  . CATARACT EXTRACTION W/ INTRAOCULAR LENS  IMPLANT, BILATERAL  2011  . CYSTOSCOPY N/A 03/11/2018   Procedure: CYSTOSCOPY WITH INSTILLATION OF POST OPERATIVE EPIRUBICIN;  Surgeon: Festus Aloe, MD;  [facility];  Service: Urology;  Laterality: N/A;  . LEFT HEART CATH AND CORONARY ANGIOGRAPHY N/A 05/25/2020   Procedure: LEFT HEART CATH AND CORONARY ANGIOGRAPHY;  Surgeon: Troy Sine, MD;  Location: [place] CV LAB;  Service: Cardiovascular;  Laterality: N/A;  . MASTECTOMY Right   . PARTIAL COLECTOMY  2002   sigmoid and appectomy (diverticulitis)  . PARTIAL MASTECTOMY WITH NEEDLE LOCALIZATION Left 02/23/2013   Procedure:  LEFT PARTIAL MASTECTOMY WITH NEEDLE LOCALIZATION;  Surgeon: Adin Hector, MD;  Location: [place];  Service: General;  Laterality: Left;  . RECONSTRUCTION BREAST W/ LATISSIMUS DORSI FLAP Right 05-30-2009   dr Harlow Mares  [facility]  . REDUCTION MAMMAPLASTY Left   . SIMPLE MASTECTOMY Right 12-28-2008    dr Brantley Stage  [facility]  . TRANSTHORACIC ECHOCARDIOGRAM  07-09-2016  dr Aundra Dubin   ef 55-60%, grade 1 diastoic dysfunction/  mild TR  . TRANSURETHRAL RESECTION OF BLADDER TUMOR N/A 03/11/2018   Procedure: TRANSURETHRAL RESECTION OF BLADDER TUMOR (TURBT) 2-5cm;  Surgeon: Festus Aloe, MD;  [facility];  Service: Urology;  Laterality: N/A;  . VAGINAL HYSTERECTOMY  1973  . VIDEO ASSISTED THORACOSCOPY (VATS)/WEDGE RESECTION Right  09-11-2005  &  06-14-2011   dr Fara Boros  [facility]   both RUL    Short Social History:  Social History   Tobacco Use  . Smoking status: Former Smoker    Packs/day: 0.75    Years: 50.00    Pack years: 37.50    Types: Cigarettes    Quit date: 05/18/2016    Years since quitting: 4.1  . Smokeless tobacco:  Never Used  Substance Use Topics  . Alcohol use: Yes    Alcohol/week: 10.0 standard drinks    Types: 10 Cans of beer per week    Allergies  Allergen Reactions  . Sulfa Antibiotics Swelling    Current Outpatient Medications  Medication Sig Dispense Refill  . albuterol (VENTOLIN HFA) 108 (90 Base) MCG/ACT inhaler Inhale 2 puffs into the lungs every 6 (six) hours as needed for wheezing or shortness of breath. 28 g 3  . aspirin EC 81 MG tablet Take 81 mg by mouth daily.    Marland Kitchen atorvastatin (LIPITOR) 80 MG tablet TAKE 1 TABLET BY MOUTH EVERY DAY 90 tablet 1  . benzonatate (TESSALON PERLES) 100 MG capsule Take 1 capsule (100 mg total) by mouth 3 (three) times daily as needed for cough. 30 capsule 0  . cilostazol (PLETAL) 100 MG tablet Take 1 tablet (100 mg total) by mouth 2 (two) times daily. 60 tablet 11  . dextromethorphan-guaiFENesin (MUCINEX DM) 30-600 MG 12hr tablet Take 1 tablet by mouth 2 (two) times daily.    . famotidine (PEPCID) 40 MG tablet Take 1 tablet (40 mg total) by mouth at bedtime. 30 tablet 0  . fluticasone (FLONASE) 50 MCG/ACT nasal spray USE 1 SPRAY INTO EACH NOSTRIL TWICE A DAY 48 mL 1  . Fluticasone-Umeclidin-Vilant (TRELEGY ELLIPTA) 200-62.5-25 MCG/INH AEPB Inhale 1 puff into the lungs daily. 60 each 5  . ipratropium-albuterol (DUONEB) 0.5-2.5 (3) MG/3ML SOLN Inhale 3 mLs into the lungs every 6 (six) hours as needed (sob, wheezing). 360 mL 6  . levothyroxine (SYNTHROID) 112 MCG tablet TAKE 1 TABLET (112 MCG TOTAL) BY MOUTH DAILY BEFORE BREAKFAST. 90 tablet 1  . LORazepam (ATIVAN) 2 MG tablet Take 1 tablet (2 mg total) by mouth at bedtime. 30 tablet 2  . montelukast  (SINGULAIR) 10 MG tablet Take 1 tablet (10 mg total) by mouth at bedtime. 90 tablet 3  . pantoprazole (PROTONIX) 40 MG tablet Take 1 tablet (40 mg total) by mouth daily. 30 tablet 1  . PARoxetine (PAXIL) 20 MG tablet Take 0.5 tablets (10 mg total) by mouth at bedtime. 30 tablet 1  . traZODone (DESYREL) 100 MG tablet Take 100 mg by mouth at bedtime.     No current facility-administered medications for this visit.    Review of Systems  Constitutional:  Constitutional negative. HENT: HENT negative.  Eyes: Eyes negative.  Cardiovascular: Cardiovascular negative.  GI: Gastrointestinal negative.  Musculoskeletal: Musculoskeletal negative. Positive for leg pain.       Left arm fatigue Skin: Skin negative.  Neurological: Positive for numbness.  Hematologic: Hematologic/lymphatic negative.  Psychiatric: Psychiatric negative.        Objective:  Objective   Vitals:   06/24/20 1541  BP: 108/74  Pulse: 66  Resp: 20  Temp: (!) 97.3 F (36.3 C)  SpO2: 95%  Weight: 136 lb (61.7 kg)  Height: 5\' 6"  (1.676 m)   Body mass index is 21.95 kg/m.  Physical Exam Constitutional:      Appearance: She is normal weight.  HENT:     Nose:     Comments: Wearing a mask Eyes:     Pupils: Pupils are equal, round, and reactive to light.  Cardiovascular:     Rate and Rhythm: Normal rate.     Pulses:          Radial pulses are 2+ on the right side and 0 on the left side.       Femoral pulses are 2+ on  the right side and 2+ on the left side. Pulmonary:     Effort: Pulmonary effort is normal.  Abdominal:     General: Abdomen is flat.     Palpations: Abdomen is soft. There is no mass.  Musculoskeletal:        General: No swelling. Normal range of motion.  Skin:    General: Skin is warm and dry.     Capillary Refill: Capillary refill takes less than 2 seconds.  Neurological:     General: No focal deficit present.     Mental Status: She is alert. Mental status is at baseline.  Psychiatric:          Mood and Affect: Mood normal.        Thought Content: Thought content normal.        Judgment: Judgment normal.     Data: Summary:  Right Carotid: Velocities in the right ICA are consistent with a 1-39%  stenosis.   Left Carotid: Velocities in the left ICA are consistent with a 1-39%  stenosis.   Vertebrals: Right vertebral artery demonstrates antegrade flow. Left  vertebral        artery demonstrates retrograde flow.  Subclavians: Normal flow hemodynamics were seen in the right subclavian  artery.        Left subclavian waveform monophasic- indicative of a more  proximal        obstruction.      Assessment/Plan:     73 year old female with history of chronic mesenteric ischemia now with symptoms that appear relative to her left subclavian artery occlusion.  We discussed that much of her dizziness and left upper extremity fatigue may not be related to her subclavian artery but in reviewing her CT scan from earlier this year she does appear to have significant stenosis.  We discussed proceeding with arch aortogram possible invention of the left subclavian artery.  We will get this scheduled in the near future.  We could also evaluate her mesenteric arteries at that time.     Waynetta Sandy MD [facility]

## 2020-06-24 NOTE — H&P (View-Only) (Signed)
Patient ID: [name], female   DOB: 03/14/1946, 74 y.o.   MRN: [id number]  Reason for Consult: Follow-up   Referred by [place], [name], MD  Subjective:     HPI:  [name] is a 74 y.o. female with history of chronic mesenteric ischemia for which she has done very well has never had intervention.  Currently her weight is stable.  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  She states she has early fatigue and left upper extremity and also occasionally has lightheadedness when using her upper extremity on the left.  She has no issues with the right upper extremity.  She denies any history of stroke TIA or amaurosis.  Past Medical History:  Diagnosis Date  . Bilateral leg cramps   . Bladder neoplasm   . Chronic deep vein thrombosis (DVT) of right lower extremity ([place])    05/ 2018  chronic non-occlusive DVT RLE  . COPD, frequent exacerbations ([place])    03-06-2018 per pt last exacerbation 12/ 2018  . Coronary artery disease    cardiologist-  dr Aundra Dubin-- 03-11-2018 er cath , 80% stenosis in the ostial second diagonal  . Dyspnea on minimal exertion   . Emphysema/COPD ([place])    CAT score- 17  . Family history of breast cancer   . Family history of colon cancer   . Family history of melanoma   . Family history of ovarian cancer   . Family history of pancreatic cancer   . Fibromyalgia 1995  . GERD (gastroesophageal reflux disease)   . Hiatal hernia   . History of breast cancer   . History of diverticulitis of colon    2002  s/p  sigmoid colectomy  . History of multiple pulmonary nodules    hx RUL nodules x2  s/p right VATS w/ wedge resection 09-11-2005 and 06-14-2011  both  necrotizing granulomatous inflammation w/ cystic area of necrosis & focal calcification  . History of right breast cancer oncologist-  dr Jana Hakim-- no recurrence   dx 2010--  IDC, Stage IA , ER/PR+,  (pT1c pN0)  11-09-2008 right lumpectomy;  12-18-2008 right simple mastectomy for DCIS margins;  completed chemotherapy 2010 (no radiation) and completed antiestrogen therapy  . Hx of colonic polyps    Dr. Cristina Gong -last study '11  . Hyperlipidemia   . Hypertension   . Hypothyroidism   . 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 Nocturia   . OA (osteoarthritis)    thumbs  . On supplemental oxygen therapy    03-06-2018 per pt uses only at night,  checks O2 sats at home,  stated am sat 89% after moving around average 93-94% with RA  . Osteoporosis   . Peripheral arterial occlusive disease [facility]) vascular-- dr chen/ dr Donzetta Matters   proximal right SFA severe focal stenosis with collaterals from the PFA/  04/ 2012 occluded celiac and SMA arteries with distal reconstitution w/ patent IMA  . Peripheral vascular disease ([place])    chronic DVT RLE,  mesenteric vascular disease  . Right lower lobe pulmonary nodule    Chest CT 08-29-2017  . Stage 3 severe COPD by GOLD classification ([place]) hx frequent exacerbations--   pulmologist-  dr Maryann Alar--  per lov note , dated 12-20-2017, oxyogen 2L is prescribed for use with exertion (but pt only uses mostly at night), O2 sats on RA run the  80s , this day sat 86% RA and with 2L O2 sat 92%  . Varicose veins of leg with swelling    varicose vein surgery - Dr. Aleda Grana  . Wears glasses   . Wears hearing aid in both ears    Family History  Problem Relation Age of Onset  . Colon cancer Mother        colon  . COPD Mother        brown lung  . Hypertension Mother   . Diabetes Mother   . Emphysema Mother   . Coronary artery disease Father   . Heart attack Father   . Sudden death Father   . Heart disease Father   . Cancer Sister        throat  . Hypertension Sister   . Coronary artery disease Brother   . Heart attack Brother        early 8s  . Throat cancer Sister   . Ovarian cancer Sister        fallopian tube cancer in her 58s  .  Melanoma Sister   . Hypertension Sister   . Pancreatic cancer Sister   . Melanoma Niece        dx in her 8s  . Breast cancer Niece 76   Past Surgical History:  Procedure Laterality Date  . BREAST EXCISIONAL BIOPSY Left 10/15/2008  . BREAST LUMPECTOMY W/ NEEDLE LOCALIZATION Right 11-09-2008    dr Brantley Stage   [facility]  . CARDIAC CATHETERIZATION  03-12-2011   dr Aundra Dubin   70-80% ostial stenosis in small second diagonal (appears to small for intervention),  dLAD 40-50%,  minimal luminal irregulartieis involoving LCFx and RCA,  LVEF 55%  . CATARACT EXTRACTION W/ INTRAOCULAR LENS  IMPLANT, BILATERAL  2011  . CYSTOSCOPY N/A 03/11/2018   Procedure: CYSTOSCOPY WITH INSTILLATION OF POST OPERATIVE EPIRUBICIN;  Surgeon: Festus Aloe, MD;  [facility];  Service: Urology;  Laterality: N/A;  . LEFT HEART CATH AND CORONARY ANGIOGRAPHY N/A 05/25/2020   Procedure: LEFT HEART CATH AND CORONARY ANGIOGRAPHY;  Surgeon: Troy Sine, MD;  Location: [place] CV LAB;  Service: Cardiovascular;  Laterality: N/A;  . MASTECTOMY Right   . PARTIAL COLECTOMY  2002   sigmoid and appectomy (diverticulitis)  . PARTIAL MASTECTOMY WITH NEEDLE LOCALIZATION Left 02/23/2013   Procedure:  LEFT PARTIAL MASTECTOMY WITH NEEDLE LOCALIZATION;  Surgeon: Adin Hector, MD;  Location: [place];  Service: General;  Laterality: Left;  . RECONSTRUCTION BREAST W/ LATISSIMUS DORSI FLAP Right 05-30-2009   dr Harlow Mares  [facility]  . REDUCTION MAMMAPLASTY Left   . SIMPLE MASTECTOMY Right 12-28-2008    dr Brantley Stage  [facility]  . TRANSTHORACIC ECHOCARDIOGRAM  07-09-2016  dr Aundra Dubin   ef 55-60%, grade 1 diastoic dysfunction/  mild TR  . TRANSURETHRAL RESECTION OF BLADDER TUMOR N/A 03/11/2018   Procedure: TRANSURETHRAL RESECTION OF BLADDER TUMOR (TURBT) 2-5cm;  Surgeon: Festus Aloe, MD;  [facility];  Service: Urology;  Laterality: N/A;  . VAGINAL HYSTERECTOMY  1973  . VIDEO ASSISTED THORACOSCOPY (VATS)/WEDGE RESECTION Right  09-11-2005  &  06-14-2011   dr Fara Boros  [facility]   both RUL    Short Social History:  Social History   Tobacco Use  . Smoking status: Former Smoker    Packs/day: 0.75    Years: 50.00    Pack years: 37.50    Types: Cigarettes    Quit date: 05/18/2016    Years since quitting: 4.1  . Smokeless tobacco:  Never Used  Substance Use Topics  . Alcohol use: Yes    Alcohol/week: 10.0 standard drinks    Types: 10 Cans of beer per week    Allergies  Allergen Reactions  . Sulfa Antibiotics Swelling    Current Outpatient Medications  Medication Sig Dispense Refill  . albuterol (VENTOLIN HFA) 108 (90 Base) MCG/ACT inhaler Inhale 2 puffs into the lungs every 6 (six) hours as needed for wheezing or shortness of breath. 28 g 3  . aspirin EC 81 MG tablet Take 81 mg by mouth daily.    Marland Kitchen atorvastatin (LIPITOR) 80 MG tablet TAKE 1 TABLET BY MOUTH EVERY DAY 90 tablet 1  . benzonatate (TESSALON PERLES) 100 MG capsule Take 1 capsule (100 mg total) by mouth 3 (three) times daily as needed for cough. 30 capsule 0  . cilostazol (PLETAL) 100 MG tablet Take 1 tablet (100 mg total) by mouth 2 (two) times daily. 60 tablet 11  . dextromethorphan-guaiFENesin (MUCINEX DM) 30-600 MG 12hr tablet Take 1 tablet by mouth 2 (two) times daily.    . famotidine (PEPCID) 40 MG tablet Take 1 tablet (40 mg total) by mouth at bedtime. 30 tablet 0  . fluticasone (FLONASE) 50 MCG/ACT nasal spray USE 1 SPRAY INTO EACH NOSTRIL TWICE A DAY 48 mL 1  . Fluticasone-Umeclidin-Vilant (TRELEGY ELLIPTA) 200-62.5-25 MCG/INH AEPB Inhale 1 puff into the lungs daily. 60 each 5  . ipratropium-albuterol (DUONEB) 0.5-2.5 (3) MG/3ML SOLN Inhale 3 mLs into the lungs every 6 (six) hours as needed (sob, wheezing). 360 mL 6  . levothyroxine (SYNTHROID) 112 MCG tablet TAKE 1 TABLET (112 MCG TOTAL) BY MOUTH DAILY BEFORE BREAKFAST. 90 tablet 1  . LORazepam (ATIVAN) 2 MG tablet Take 1 tablet (2 mg total) by mouth at bedtime. 30 tablet 2  . montelukast  (SINGULAIR) 10 MG tablet Take 1 tablet (10 mg total) by mouth at bedtime. 90 tablet 3  . pantoprazole (PROTONIX) 40 MG tablet Take 1 tablet (40 mg total) by mouth daily. 30 tablet 1  . PARoxetine (PAXIL) 20 MG tablet Take 0.5 tablets (10 mg total) by mouth at bedtime. 30 tablet 1  . traZODone (DESYREL) 100 MG tablet Take 100 mg by mouth at bedtime.     No current facility-administered medications for this visit.    Review of Systems  Constitutional:  Constitutional negative. HENT: HENT negative.  Eyes: Eyes negative.  Cardiovascular: Cardiovascular negative.  GI: Gastrointestinal negative.  Musculoskeletal: Musculoskeletal negative. Positive for leg pain.       Left arm fatigue Skin: Skin negative.  Neurological: Positive for numbness.  Hematologic: Hematologic/lymphatic negative.  Psychiatric: Psychiatric negative.        Objective:  Objective   Vitals:   06/24/20 1541  BP: 108/74  Pulse: 66  Resp: 20  Temp: (!) 97.3 F (36.3 C)  SpO2: 95%  Weight: 136 lb (61.7 kg)  Height: 5\' 6"  (1.676 m)   Body mass index is 21.95 kg/m.  Physical Exam Constitutional:      Appearance: She is normal weight.  HENT:     Nose:     Comments: Wearing a mask Eyes:     Pupils: Pupils are equal, round, and reactive to light.  Cardiovascular:     Rate and Rhythm: Normal rate.     Pulses:          Radial pulses are 2+ on the right side and 0 on the left side.       Femoral pulses are 2+ on  the right side and 2+ on the left side. Pulmonary:     Effort: Pulmonary effort is normal.  Abdominal:     General: Abdomen is flat.     Palpations: Abdomen is soft. There is no mass.  Musculoskeletal:        General: No swelling. Normal range of motion.  Skin:    General: Skin is warm and dry.     Capillary Refill: Capillary refill takes less than 2 seconds.  Neurological:     General: No focal deficit present.     Mental Status: She is alert. Mental status is at baseline.  Psychiatric:          Mood and Affect: Mood normal.        Thought Content: Thought content normal.        Judgment: Judgment normal.     Data: Summary:  Right Carotid: Velocities in the right ICA are consistent with a 1-39%  stenosis.   Left Carotid: Velocities in the left ICA are consistent with a 1-39%  stenosis.   Vertebrals: Right vertebral artery demonstrates antegrade flow. Left  vertebral        artery demonstrates retrograde flow.  Subclavians: Normal flow hemodynamics were seen in the right subclavian  artery.        Left subclavian waveform monophasic- indicative of a more  proximal        obstruction.      Assessment/Plan:     74 year old female with history of chronic mesenteric ischemia now with symptoms that appear relative to her left subclavian artery occlusion.  We discussed that much of her dizziness and left upper extremity fatigue may not be related to her subclavian artery but in reviewing her CT scan from earlier this year she does appear to have significant stenosis.  We discussed proceeding with arch aortogram possible invention of the left subclavian artery.  We will get this scheduled in the near future.  We could also evaluate her mesenteric arteries at that time.     Waynetta Sandy MD [facility]

## 2020-06-26 ENCOUNTER — Encounter: Payer: Self-pay | Admitting: Primary Care

## 2020-06-26 DIAGNOSIS — R0683 Snoring: Secondary | ICD-10-CM | POA: Insufficient documentation

## 2020-06-26 NOTE — Assessment & Plan Note (Signed)
-   Report loud snoring, wakes up occasionally gasping for air - Split night sleep study re: snoring, chronic respiratory failure

## 2020-06-26 NOTE — Assessment & Plan Note (Addendum)
-   Last hospitalized for AECOPD 05/20/20-05/29/20. Currently doing well. Started on medication for anxiety/depression that may have been exacerbating respiratory symptoms - Continue Trelegy 200 1 puff daily; Albuterol rescue inhaler 2 puffs every 6 hours as needed and Singulair 10mg  at bedtime - FU in 2-3 months

## 2020-06-26 NOTE — Assessment & Plan Note (Signed)
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 Esophagram showed small to moderate size hiatal hernia, esophageal dysmotility - Continue Protonix daily and Pepcid at bedtime

## 2020-06-27 ENCOUNTER — Other Ambulatory Visit: Payer: Self-pay | Admitting: Family Medicine

## 2020-06-27 DIAGNOSIS — F419 Anxiety disorder, unspecified: Secondary | ICD-10-CM

## 2020-06-27 DIAGNOSIS — G4700 Insomnia, unspecified: Secondary | ICD-10-CM

## 2020-07-09 ENCOUNTER — Other Ambulatory Visit (HOSPITAL_COMMUNITY)
Admission: RE | Admit: 2020-07-09 | Discharge: 2020-07-09 | Disposition: A | Discharge status: Home / Self Care | Payer: Medicare Other | Source: Ambulatory Visit | Attending: Vascular Surgery | Admitting: Vascular Surgery

## 2020-07-09 DIAGNOSIS — Z01812 Encounter for preprocedural laboratory examination: Secondary | ICD-10-CM | POA: Diagnosis present

## 2020-07-09 DIAGNOSIS — Z20822 Contact with and (suspected) exposure to covid-19: Secondary | ICD-10-CM | POA: Diagnosis not present

## 2020-07-09 LAB — SARS CORONAVIRUS 2 (TAT 6-24 HRS): SARS Coronavirus 2: NEGATIVE

## 2020-07-11 ENCOUNTER — Other Ambulatory Visit: Payer: Self-pay

## 2020-07-11 ENCOUNTER — Ambulatory Visit (HOSPITAL_COMMUNITY)
Admission: RE | Admit: 2020-07-11 | Discharge: 2020-07-11 | Disposition: A | Discharge status: Home / Self Care | Payer: Medicare Other | Attending: Vascular Surgery | Admitting: Vascular Surgery

## 2020-07-11 ENCOUNTER — Encounter (HOSPITAL_COMMUNITY)
Admission: RE | Disposition: A | Discharge status: Home / Self Care | Payer: Self-pay | Source: Home / Self Care | Attending: Vascular Surgery

## 2020-07-11 ENCOUNTER — Encounter (HOSPITAL_COMMUNITY): Payer: Self-pay | Admitting: Vascular Surgery

## 2020-07-11 DIAGNOSIS — Z7982 Long term (current) use of aspirin: Secondary | ICD-10-CM | POA: Diagnosis not present

## 2020-07-11 DIAGNOSIS — Z882 Allergy status to sulfonamides status: Secondary | ICD-10-CM | POA: Insufficient documentation

## 2020-07-11 DIAGNOSIS — Z79890 Hormone replacement therapy (postmenopausal): Secondary | ICD-10-CM | POA: Insufficient documentation

## 2020-07-11 DIAGNOSIS — E039 Hypothyroidism, unspecified: Secondary | ICD-10-CM | POA: Diagnosis not present

## 2020-07-11 DIAGNOSIS — Z853 Personal history of malignant neoplasm of breast: Secondary | ICD-10-CM | POA: Diagnosis not present

## 2020-07-11 DIAGNOSIS — Z9981 Dependence on supplemental oxygen: Secondary | ICD-10-CM | POA: Diagnosis not present

## 2020-07-11 DIAGNOSIS — J449 Chronic obstructive pulmonary disease, unspecified: Secondary | ICD-10-CM | POA: Insufficient documentation

## 2020-07-11 DIAGNOSIS — K55059 Acute (reversible) ischemia of intestine, part and extent unspecified: Secondary | ICD-10-CM | POA: Insufficient documentation

## 2020-07-11 DIAGNOSIS — E785 Hyperlipidemia, unspecified: Secondary | ICD-10-CM | POA: Insufficient documentation

## 2020-07-11 DIAGNOSIS — K219 Gastro-esophageal reflux disease without esophagitis: Secondary | ICD-10-CM | POA: Insufficient documentation

## 2020-07-11 DIAGNOSIS — M797 Fibromyalgia: Secondary | ICD-10-CM | POA: Diagnosis not present

## 2020-07-11 DIAGNOSIS — I10 Essential (primary) hypertension: Secondary | ICD-10-CM | POA: Diagnosis not present

## 2020-07-11 DIAGNOSIS — Z87891 Personal history of nicotine dependence: Secondary | ICD-10-CM | POA: Diagnosis not present

## 2020-07-11 DIAGNOSIS — G458 Other transient cerebral ischemic attacks and related syndromes: Secondary | ICD-10-CM | POA: Insufficient documentation

## 2020-07-11 DIAGNOSIS — Z79899 Other long term (current) drug therapy: Secondary | ICD-10-CM | POA: Insufficient documentation

## 2020-07-11 DIAGNOSIS — I739 Peripheral vascular disease, unspecified: Secondary | ICD-10-CM | POA: Insufficient documentation

## 2020-07-11 DIAGNOSIS — I2510 Atherosclerotic heart disease of native coronary artery without angina pectoris: Secondary | ICD-10-CM | POA: Insufficient documentation

## 2020-07-11 DIAGNOSIS — I771 Stricture of artery: Secondary | ICD-10-CM

## 2020-07-11 HISTORY — PX: PERIPHERAL VASCULAR INTERVENTION: CATH118257

## 2020-07-11 HISTORY — PX: UPPER EXTREMITY ANGIOGRAPHY: CATH118270

## 2020-07-11 HISTORY — PX: ABDOMINAL AORTOGRAM: CATH118222

## 2020-07-11 HISTORY — PX: AORTIC ARCH ANGIOGRAPHY: CATH118224

## 2020-07-11 LAB — POCT I-STAT, CHEM 8
BUN: 15 mg/dL (ref 8–23)
Calcium, Ion: 0.95 mmol/L — ABNORMAL LOW (ref 1.15–1.40)
Chloride: 107 mmol/L (ref 98–111)
Creatinine, Ser: 0.8 mg/dL (ref 0.44–1.00)
Glucose, Bld: 90 mg/dL (ref 70–99)
HCT: 42 % (ref 36.0–46.0)
Hemoglobin: 14.3 g/dL (ref 12.0–15.0)
Potassium: 4.1 mmol/L (ref 3.5–5.1)
Sodium: 137 mmol/L (ref 135–145)
TCO2: 22 mmol/L (ref 22–32)

## 2020-07-11 SURGERY — AORTIC ARCH ANGIOGRAPHY
Anesthesia: LOCAL

## 2020-07-11 MED ORDER — SODIUM CHLORIDE 0.9 % IV SOLN: INTRAVENOUS | Status: DC

## 2020-07-11 MED ORDER — HEPARIN SODIUM (PORCINE) 1000 UNIT/ML IJ SOLN
INTRAMUSCULAR | Status: AC
  Filled 2020-07-11: qty 1

## 2020-07-11 MED ORDER — HYDRALAZINE HCL 20 MG/ML IJ SOLN
INTRAMUSCULAR | Status: AC
  Filled 2020-07-11: qty 1

## 2020-07-11 MED ORDER — IODIXANOL 320 MG/ML IV SOLN
INTRAVENOUS | Status: DC | PRN
  Administered 2020-07-11: 100 mL

## 2020-07-11 MED ORDER — LIDOCAINE HCL (PF) 1 % IJ SOLN
INTRAMUSCULAR | Status: DC | PRN
  Administered 2020-07-11: 15 mL

## 2020-07-11 MED ORDER — HEPARIN SODIUM (PORCINE) 1000 UNIT/ML IJ SOLN
INTRAMUSCULAR | Status: DC | PRN
  Administered 2020-07-11 (×2): 3000 [IU] via INTRAVENOUS

## 2020-07-11 MED ORDER — PROTAMINE SULFATE 10 MG/ML IV SOLN
INTRAVENOUS | Status: DC | PRN
  Administered 2020-07-11: 20 mg via INTRAVENOUS
  Administered 2020-07-11: 5 mg via INTRAVENOUS

## 2020-07-11 MED ORDER — ONDANSETRON HCL 4 MG/2ML IJ SOLN: 4.0000 mg | Freq: Four times a day (QID) | INTRAMUSCULAR | Status: DC | PRN

## 2020-07-11 MED ORDER — MIDAZOLAM HCL 2 MG/2ML IJ SOLN
INTRAMUSCULAR | Status: AC
  Filled 2020-07-11: qty 2

## 2020-07-11 MED ORDER — SODIUM CHLORIDE 0.9% FLUSH: 3.0000 mL | Freq: Two times a day (BID) | INTRAVENOUS | Status: DC

## 2020-07-11 MED ORDER — PROTAMINE SULFATE 10 MG/ML IV SOLN
INTRAVENOUS | Status: AC
  Filled 2020-07-11: qty 5

## 2020-07-11 MED ORDER — CLOPIDOGREL BISULFATE 75 MG PO TABS: 300.0000 mg | ORAL_TABLET | Freq: Once | ORAL | Status: DC

## 2020-07-11 MED ORDER — SODIUM CHLORIDE 0.9 % IV SOLN: 250.0000 mL | INTRAVENOUS | Status: DC | PRN

## 2020-07-11 MED ORDER — OXYCODONE HCL 5 MG PO TABS: 5.0000 mg | ORAL_TABLET | ORAL | Status: DC | PRN

## 2020-07-11 MED ORDER — SODIUM CHLORIDE 0.9% FLUSH: 3.0000 mL | INTRAVENOUS | Status: DC | PRN

## 2020-07-11 MED ORDER — HEPARIN (PORCINE) IN NACL 1000-0.9 UT/500ML-% IV SOLN
INTRAVENOUS | Status: AC
  Filled 2020-07-11: qty 1000

## 2020-07-11 MED ORDER — CLOPIDOGREL BISULFATE 75 MG PO TABS: 75.0000 mg | ORAL_TABLET | Freq: Every day | ORAL | 11 refills | Status: DC

## 2020-07-11 MED ORDER — FENTANYL CITRATE (PF) 100 MCG/2ML IJ SOLN
INTRAMUSCULAR | Status: AC
  Filled 2020-07-11: qty 2

## 2020-07-11 MED ORDER — ACETAMINOPHEN 325 MG PO TABS: 650.0000 mg | ORAL_TABLET | ORAL | Status: DC | PRN

## 2020-07-11 MED ORDER — CLOPIDOGREL BISULFATE 75 MG PO TABS: 75.0000 mg | ORAL_TABLET | Freq: Every day | ORAL | Status: DC

## 2020-07-11 MED ORDER — MIDAZOLAM HCL 2 MG/2ML IJ SOLN
INTRAMUSCULAR | Status: DC | PRN
  Administered 2020-07-11: 1 mg via INTRAVENOUS

## 2020-07-11 MED ORDER — MORPHINE SULFATE (PF) 2 MG/ML IV SOLN: 2.0000 mg | INTRAVENOUS | Status: DC | PRN

## 2020-07-11 MED ORDER — HEPARIN (PORCINE) IN NACL 1000-0.9 UT/500ML-% IV SOLN
INTRAVENOUS | Status: DC | PRN
  Administered 2020-07-11: 500 mL

## 2020-07-11 MED ORDER — LABETALOL HCL 5 MG/ML IV SOLN: 10.0000 mg | INTRAVENOUS | Status: DC | PRN

## 2020-07-11 MED ORDER — CLOPIDOGREL BISULFATE 300 MG PO TABS
ORAL_TABLET | ORAL | Status: DC | PRN
  Administered 2020-07-11: 300 mg via ORAL

## 2020-07-11 MED ORDER — LIDOCAINE HCL (PF) 1 % IJ SOLN
INTRAMUSCULAR | Status: AC
  Filled 2020-07-11: qty 30

## 2020-07-11 MED ORDER — HYDRALAZINE HCL 20 MG/ML IJ SOLN: 5.0000 mg | INTRAMUSCULAR | Status: DC | PRN

## 2020-07-11 MED ORDER — FENTANYL CITRATE (PF) 100 MCG/2ML IJ SOLN
INTRAMUSCULAR | Status: DC | PRN
Start: 2020-07-11 — End: 2020-07-11
  Administered 2020-07-11: 50 ug via INTRAVENOUS

## 2020-07-11 MED ORDER — CLOPIDOGREL BISULFATE 300 MG PO TABS
ORAL_TABLET | ORAL | Status: AC
  Filled 2020-07-11: qty 1

## 2020-07-11 MED ORDER — HYDRALAZINE HCL 20 MG/ML IJ SOLN
INTRAMUSCULAR | Status: DC | PRN
  Administered 2020-07-11: 10 mg via INTRAVENOUS

## 2020-07-11 SURGICAL SUPPLY — 15 items
CATH ANGIO 5F BER2 100CM (CATHETERS) ×1 IMPLANT
CATH ANGIO 5F PIGTAIL 100CM (CATHETERS) ×1 IMPLANT
GLIDEWIRE ADV .035X260CM (WIRE) ×1 IMPLANT
KIT ENCORE 26 ADVANTAGE (KITS) ×1 IMPLANT
KIT MICROPUNCTURE NIT STIFF (SHEATH) ×1 IMPLANT
KIT PV (KITS) ×4 IMPLANT
SHEATH DESTINATION 7FR 90 (SHEATH) ×1 IMPLANT
SHEATH PINNACLE 5F 10CM (SHEATH) ×1 IMPLANT
SHEATH PINNACLE 7F 10CM (SHEATH) ×1 IMPLANT
SHEATH PROBE COVER 6X72 (BAG) ×1 IMPLANT
STENT VIABAHNBX 8X29X135 (Permanent Stent) ×1 IMPLANT
SYR MEDRAD MARK V 150ML (SYRINGE) ×1 IMPLANT
TRANSDUCER W/STOPCOCK (MISCELLANEOUS) ×4 IMPLANT
TRAY PV CATH (CUSTOM PROCEDURE TRAY) ×4 IMPLANT
WIRE STARTER BENTSON 035X150 (WIRE) ×1 IMPLANT

## 2020-07-11 NOTE — Interval H&P Note (Signed)
History and Physical Interval Note:  07/11/2020 7:19 AM  [name]  has presented today for surgery, with the diagnosis of subclavian steal syndrome.  The various methods of treatment have been discussed with the patient and family. After consideration of risks, benefits and other options for treatment, the patient has consented to  Procedure(s): AORTIC ARCH ANGIOGRAPHY (N/A) as a surgical intervention.  The patient's history has been reviewed, patient examined, no change in status, stable for surgery.  I have reviewed the patient's chart and labs.  Questions were answered to the patient's satisfaction.     Servando Snare

## 2020-07-11 NOTE — Op Note (Signed)
    Patient name: [name] MRN: [id number] DOB: [birth year] Sex: female  07/11/2020 Pre-operative Diagnosis: Left subclavian artery steal Post-operative diagnosis:  Same Surgeon:  Erlene Quan C. Donzetta Matters, MD Procedure Performed: 1.  Ultrasound-guided cannulation right common femoral artery 2.  Arch aortogram 3.  Stent of left subclavian artery with 8 x 29 mm VBX 4.  Abdominal aortogram 5.  Moderate sedation with fentanyl Versed for 48 minutes  Indications: 74 year old female with known history of mesenteric artery stenoses.  This has never been intervened upon.  She more recently was found to have a subtotally occluded left subclavian artery with retrograde flow in her vertebral artery which was noted to be symptomatic.  She is now indicated for angiography possible invention.  Findings: Left subclavian artery was heavily calcified she had retrograde flow in her left vertebral artery.  She appears to have a bovine arch these arteries are all patent.  After intervention of the left subclavian artery there is antegrade flow in the left vertebral artery 0% residual stenosis were previously was occluded.  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  Bilateral renal arteries without stenosis.   Procedure:  The patient was identified in the holding area and taken to room 8.  The patient was then placed supine on the table and prepped and draped in the usual sterile fashion.  A time out was called.  Ultrasound was used to evaluate the right common femoral artery which was noted be patent.  The areas anesthetized 1% lidocaine cannulated with micropuncture needle followed by wire sheath.  Bentson wires placed followed by 5 [place] sheath.  And images saved the permanent record.  We placed a pigtail catheter to the arch of the aorta 3000 heparin was administered.  We performed arch aortogram with the above findings.  We then were able to cannulate the left clavian artery with a  Glidewire advantage with the help of a vert catheter.  We able to cross the stenosed/occluded vessel confirmed access into the axillary artery.  We also able to identify the takeoff of the left vertebral artery at that time.  We placed the Glidewire advantage out distal into the brachial artery exchanged for a long 7 [place] sheath.  We then primarily stented with an 8 x 29 mm VBX.  Completion demonstrated no residual stenosis and antegrade flow in the left vertebral artery.  We then remove the wire and sheath down to the level of the L1 vertebral body.  Pigtail catheter was replaced abdominal aortogram performed in lateral and AP views which demonstrated occluded celiac and SMA.  Given that patient is asymptomatic these long segment occlusions there will be no intervention.  We then attempted exchanged for short 7 French sheath we lost access.  25 mg of protamine was administered pressure was held till hemostasis obtained.  She tolerated procedure without any complication.   Contrast: 100cc  Augie Vane C. Donzetta Matters, MD [facility] Office: [phone] Pager: [phone]

## 2020-07-11 NOTE — Discharge Instructions (Signed)
Femoral Site Care This sheet gives you information about how to care for yourself after your procedure. Your health care provider may also give you more specific instructions. If you have problems or questions, contact your health care provider. What can I expect after the procedure? After the procedure, it is common to have:  Bruising that usually fades within 1-2 weeks.  Tenderness at the site. Follow these instructions at home: Wound care  Follow instructions from your health care provider about how to take care of your insertion site. Make sure you: ? Wash your hands with soap and water before you change your bandage (dressing). If soap and water are not available, use hand sanitizer. ? Change your dressing as told by your health care provider. ? Leave stitches (sutures), skin glue, or adhesive strips in place. These skin closures may need to stay in place for 2 weeks or longer. If adhesive strip edges start to loosen and curl up, you may trim the loose edges. Do not remove adhesive strips completely unless your health care provider tells you to do that.  Do not take baths, swim, or use a hot tub until your health care provider approves.  You may shower 24-48 hours after the procedure or as told by your health care provider. ? Gently wash the site with plain soap and water. ? Pat the area dry with a clean towel. ? Do not rub the site. This may cause bleeding.  Do not apply powder or lotion to the site. Keep the site clean and dry.  Check your femoral site every day for signs of infection. Check for: ? Redness, swelling, or pain. ? Fluid or blood. ? Warmth. ? Pus or a bad smell. Activity  For the first 2-3 days after your procedure, or as long as directed: ? Avoid climbing stairs as much as possible. ? Do not squat.  Do not lift anything that is heavier than 10 lb (4.5 kg), or the limit that you are told, until your health care provider says that it is safe.  Rest as  directed. ? Avoid sitting for a long time without moving. Get up to take short walks every 1-2 hours.  Do not drive for 24 hours if you were given a medicine to help you relax (sedative). General instructions  Take over-the-counter and prescription medicines only as told by your health care provider.  Keep all follow-up visits as told by your health care provider. This is important. Contact a health care provider if you have:  A fever or chills.  You have redness, swelling, or pain around your insertion site. Get help right away if:  The catheter insertion area swells very fast.  You pass out.  You suddenly start to sweat or your skin gets clammy.  The catheter insertion area is bleeding, and the bleeding does not stop when you hold steady pressure on the area.  The area near or just beyond the catheter insertion site becomes pale, cool, tingly, or numb. These symptoms may represent a serious problem that is an emergency. Do not wait to see if the symptoms will go away. Get medical help right away. Call your local emergency services (911 in the [place] Do not drive yourself to the hospital. Summary  After the procedure, it is common to have bruising that usually fades within 1-2 weeks.  Check your femoral site every day for signs of infection.  Do not lift anything that is heavier than 10 lb (4.5 kg), or the   limit that you are told, until your health care provider says that it is safe. This information is not intended to replace advice given to you by your health care provider. Make sure you discuss any questions you have with your health care provider. Document Revised: 10/14/2017 Document Reviewed: 10/14/2017 Elsevier Patient Education  2020 [employer]  

## 2020-07-27 ENCOUNTER — Other Ambulatory Visit: Payer: Self-pay

## 2020-07-27 ENCOUNTER — Ambulatory Visit (HOSPITAL_BASED_OUTPATIENT_CLINIC_OR_DEPARTMENT_OTHER): Payer: Medicare Other | Attending: Primary Care | Admitting: Pulmonary Disease

## 2020-07-27 DIAGNOSIS — F513 Sleepwalking [somnambulism]: Secondary | ICD-10-CM | POA: Diagnosis not present

## 2020-07-27 DIAGNOSIS — I110 Hypertensive heart disease with heart failure: Secondary | ICD-10-CM | POA: Insufficient documentation

## 2020-07-27 DIAGNOSIS — J9611 Chronic respiratory failure with hypoxia: Secondary | ICD-10-CM | POA: Insufficient documentation

## 2020-07-27 DIAGNOSIS — R0683 Snoring: Secondary | ICD-10-CM | POA: Diagnosis present

## 2020-07-27 DIAGNOSIS — J449 Chronic obstructive pulmonary disease, unspecified: Secondary | ICD-10-CM | POA: Diagnosis not present

## 2020-07-27 DIAGNOSIS — G4733 Obstructive sleep apnea (adult) (pediatric): Secondary | ICD-10-CM | POA: Insufficient documentation

## 2020-07-27 DIAGNOSIS — I509 Heart failure, unspecified: Secondary | ICD-10-CM | POA: Insufficient documentation

## 2020-07-27 DIAGNOSIS — G4736 Sleep related hypoventilation in conditions classified elsewhere: Secondary | ICD-10-CM | POA: Diagnosis not present

## 2020-07-27 DIAGNOSIS — Z79899 Other long term (current) drug therapy: Secondary | ICD-10-CM | POA: Insufficient documentation

## 2020-08-01 DIAGNOSIS — R0683 Snoring: Secondary | ICD-10-CM

## 2020-08-01 NOTE — Procedures (Signed)
    Patient Name: Christy, [name] Date: 07/27/2020 Gender: Female D.O.B: 1946-07-29 Age (years): 65 Referring Provider: Geraldo Pitter NP Height (inches): 66 Interpreting Physician: Chesley Mires MD, ABSM Weight (lbs): 135 RPSGT: Laren Everts BMI: 22 MRN: [id number] Neck Size: 14.00  CLINICAL INFORMATION Sleep Study Type: NPSG  Indication for sleep study: Congestive Heart Failure, COPD, Fatigue, Hypertension, Morning Headaches, Sleep walking/talking/parasomnias, Snoring  Epworth Sleepiness Score: 1  SLEEP STUDY TECHNIQUE As per the AASM Manual for the Scoring of Sleep and Associated Events v2.3 (April 2016) with a hypopnea requiring 4% desaturations.  The channels recorded and monitored were frontal, central and occipital EEG, electrooculogram (EOG), submentalis EMG (chin), nasal and oral airflow, thoracic and abdominal wall motion, anterior tibialis EMG, snore microphone, electrocardiogram, and pulse oximetry.  MEDICATIONS Medications self-administered by patient taken the night of the study : PAROXETINE, MONTELUKAST, LORAZEPAM, TRAZODONE, ALBUTEROL  SLEEP ARCHITECTURE The study was initiated at 10:56:42 PM and ended at 5:02:02 AM.  Sleep onset time was 14.8 minutes and the sleep efficiency was 75.7%%. The total sleep time was 276.5 minutes.  She had difficulty with sleep initiation and sleep maintenance due to respirtory events.  Stage REM latency was N/A minutes.  The patient spent 26.2%% of the night in stage N1 sleep, 73.8%% in stage N2 sleep, 0.0%% in stage N3 and 0% in REM.  Alpha intrusion was absent.  Supine sleep was 28.22%.  RESPIRATORY PARAMETERS The overall apnea/hypopnea index (AHI) was 26.5 per hour. There were 1 total apneas, including 1 obstructive, 0 central and 0 mixed apneas. There were 121 hypopneas and 48 RERAs.  The AHI during Stage REM sleep was N/A per hour.  AHI while supine was 65.4 per hour.  The mean oxygen saturation was 90.9%.  The minimum SpO2 during sleep was 84.0%.  moderate snoring was noted during this study.  CARDIAC DATA The 2 lead EKG demonstrated sinus rhythm. The mean heart rate was 81.1 beats per minute. Other EKG findings include: PVCs.  LEG MOVEMENT DATA The total PLMS were 0 with a resulting PLMS index of 0.0. Associated arousal with leg movement index was 1.7 .  IMPRESSIONS - Moderate obstructive sleep apnea with an AHI of 26.5.  She had a significant positional effect with supine AHI 65.4.  She was not observed in REM sleep. - The SpO2 low was 84%.  She had 1 liter supplemental oxygen applied during this study. - The patient snored with moderate snoring volume.  DIAGNOSIS - Obstructive Sleep Apnea (G47.33) - Nocturnal Hypoxemia (G47.36)  RECOMMENDATIONS - She should return to the sleep lab for a full night CPAP titration study. - Positional therapy avoiding supine position during sleep. - Avoid alcohol, sedatives and other CNS depressants that may worsen sleep apnea and disrupt normal sleep architecture. - Sleep hygiene should be reviewed to assess factors that may improve sleep quality.  [Electronically signed] 08/01/2020 08:46 AM  Chesley Mires MD, ABSM Diplomate, American Board of Sleep Medicine   NPI: [id number]

## 2020-08-02 ENCOUNTER — Other Ambulatory Visit: Payer: Self-pay | Admitting: Family Medicine

## 2020-08-04 ENCOUNTER — Other Ambulatory Visit: Payer: Self-pay

## 2020-08-04 DIAGNOSIS — I6522 Occlusion and stenosis of left carotid artery: Secondary | ICD-10-CM

## 2020-08-09 ENCOUNTER — Telehealth: Payer: Self-pay | Admitting: *Deleted

## 2020-08-09 NOTE — Telephone Encounter (Signed)
S/w pt cannot go to appt in [place] moved to a day at [address].

## 2020-08-14 ENCOUNTER — Other Ambulatory Visit: Payer: Self-pay

## 2020-08-14 ENCOUNTER — Emergency Department (HOSPITAL_COMMUNITY): Payer: Medicare Other

## 2020-08-14 ENCOUNTER — Inpatient Hospital Stay (HOSPITAL_COMMUNITY)
Admission: EM | Admit: 2020-08-14 | Discharge: 2020-08-21 | DRG: 190 | Disposition: A | Discharge status: Home Health | Payer: Medicare Other | Attending: Internal Medicine | Admitting: Internal Medicine

## 2020-08-14 ENCOUNTER — Encounter (HOSPITAL_COMMUNITY): Payer: Self-pay | Admitting: Internal Medicine

## 2020-08-14 ENCOUNTER — Inpatient Hospital Stay (HOSPITAL_COMMUNITY): Payer: Medicare Other

## 2020-08-14 DIAGNOSIS — J9611 Chronic respiratory failure with hypoxia: Secondary | ICD-10-CM

## 2020-08-14 DIAGNOSIS — I5021 Acute systolic (congestive) heart failure: Secondary | ICD-10-CM | POA: Diagnosis not present

## 2020-08-14 DIAGNOSIS — E785 Hyperlipidemia, unspecified: Secondary | ICD-10-CM | POA: Diagnosis present

## 2020-08-14 DIAGNOSIS — Z7902 Long term (current) use of antithrombotics/antiplatelets: Secondary | ICD-10-CM | POA: Diagnosis not present

## 2020-08-14 DIAGNOSIS — Z79899 Other long term (current) drug therapy: Secondary | ICD-10-CM

## 2020-08-14 DIAGNOSIS — E871 Hypo-osmolality and hyponatremia: Secondary | ICD-10-CM | POA: Diagnosis present

## 2020-08-14 DIAGNOSIS — Z882 Allergy status to sulfonamides status: Secondary | ICD-10-CM | POA: Diagnosis not present

## 2020-08-14 DIAGNOSIS — K219 Gastro-esophageal reflux disease without esophagitis: Secondary | ICD-10-CM | POA: Diagnosis present

## 2020-08-14 DIAGNOSIS — K224 Dyskinesia of esophagus: Secondary | ICD-10-CM | POA: Diagnosis not present

## 2020-08-14 DIAGNOSIS — E039 Hypothyroidism, unspecified: Secondary | ICD-10-CM

## 2020-08-14 DIAGNOSIS — I5043 Acute on chronic combined systolic (congestive) and diastolic (congestive) heart failure: Secondary | ICD-10-CM | POA: Diagnosis present

## 2020-08-14 DIAGNOSIS — Z79890 Hormone replacement therapy (postmenopausal): Secondary | ICD-10-CM | POA: Diagnosis not present

## 2020-08-14 DIAGNOSIS — I700 Atherosclerosis of aorta: Secondary | ICD-10-CM | POA: Insufficient documentation

## 2020-08-14 DIAGNOSIS — Z7982 Long term (current) use of aspirin: Secondary | ICD-10-CM | POA: Diagnosis not present

## 2020-08-14 DIAGNOSIS — Z20822 Contact with and (suspected) exposure to covid-19: Secondary | ICD-10-CM | POA: Diagnosis present

## 2020-08-14 DIAGNOSIS — J439 Emphysema, unspecified: Secondary | ICD-10-CM | POA: Diagnosis present

## 2020-08-14 DIAGNOSIS — Z87891 Personal history of nicotine dependence: Secondary | ICD-10-CM | POA: Diagnosis not present

## 2020-08-14 DIAGNOSIS — Z9049 Acquired absence of other specified parts of digestive tract: Secondary | ICD-10-CM | POA: Diagnosis not present

## 2020-08-14 DIAGNOSIS — E876 Hypokalemia: Secondary | ICD-10-CM | POA: Diagnosis not present

## 2020-08-14 DIAGNOSIS — F419 Anxiety disorder, unspecified: Secondary | ICD-10-CM | POA: Diagnosis present

## 2020-08-14 DIAGNOSIS — J441 Chronic obstructive pulmonary disease with (acute) exacerbation: Secondary | ICD-10-CM

## 2020-08-14 DIAGNOSIS — J9601 Acute respiratory failure with hypoxia: Secondary | ICD-10-CM

## 2020-08-14 DIAGNOSIS — Z86718 Personal history of other venous thrombosis and embolism: Secondary | ICD-10-CM

## 2020-08-14 DIAGNOSIS — Y92239 Unspecified place in hospital as the place of occurrence of the external cause: Secondary | ICD-10-CM | POA: Diagnosis not present

## 2020-08-14 DIAGNOSIS — Z853 Personal history of malignant neoplasm of breast: Secondary | ICD-10-CM | POA: Diagnosis not present

## 2020-08-14 DIAGNOSIS — Z9221 Personal history of antineoplastic chemotherapy: Secondary | ICD-10-CM | POA: Diagnosis not present

## 2020-08-14 DIAGNOSIS — I2510 Atherosclerotic heart disease of native coronary artery without angina pectoris: Secondary | ICD-10-CM | POA: Diagnosis present

## 2020-08-14 DIAGNOSIS — Z66 Do not resuscitate: Secondary | ICD-10-CM | POA: Diagnosis present

## 2020-08-14 DIAGNOSIS — K551 Chronic vascular disorders of intestine: Secondary | ICD-10-CM

## 2020-08-14 DIAGNOSIS — Z90710 Acquired absence of both cervix and uterus: Secondary | ICD-10-CM | POA: Diagnosis not present

## 2020-08-14 DIAGNOSIS — T502X5A Adverse effect of carbonic-anhydrase inhibitors, benzothiadiazides and other diuretics, initial encounter: Secondary | ICD-10-CM | POA: Diagnosis not present

## 2020-08-14 DIAGNOSIS — I110 Hypertensive heart disease with heart failure: Secondary | ICD-10-CM | POA: Diagnosis present

## 2020-08-14 DIAGNOSIS — R7989 Other specified abnormal findings of blood chemistry: Secondary | ICD-10-CM | POA: Diagnosis present

## 2020-08-14 DIAGNOSIS — R7401 Elevation of levels of liver transaminase levels: Secondary | ICD-10-CM | POA: Diagnosis present

## 2020-08-14 DIAGNOSIS — Z974 Presence of external hearing-aid: Secondary | ICD-10-CM | POA: Diagnosis not present

## 2020-08-14 DIAGNOSIS — J9621 Acute and chronic respiratory failure with hypoxia: Secondary | ICD-10-CM | POA: Diagnosis present

## 2020-08-14 HISTORY — DX: Heart failure, unspecified: I50.9

## 2020-08-14 LAB — I-STAT CHEM 8, ED
BUN: 12 mg/dL (ref 8–23)
Calcium, Ion: 1.16 mmol/L (ref 1.15–1.40)
Chloride: 103 mmol/L (ref 98–111)
Creatinine, Ser: 0.8 mg/dL (ref 0.44–1.00)
Glucose, Bld: 107 mg/dL — ABNORMAL HIGH (ref 70–99)
HCT: 44 % (ref 36.0–46.0)
Hemoglobin: 15 g/dL (ref 12.0–15.0)
Potassium: 5.1 mmol/L (ref 3.5–5.1)
Sodium: 137 mmol/L (ref 135–145)
TCO2: 27 mmol/L (ref 22–32)

## 2020-08-14 LAB — COMPREHENSIVE METABOLIC PANEL
ALT: 59 U/L — ABNORMAL HIGH (ref 0–44)
AST: 93 U/L — ABNORMAL HIGH (ref 15–41)
Albumin: 4 g/dL (ref 3.5–5.0)
Alkaline Phosphatase: 81 U/L (ref 38–126)
Anion gap: 7 (ref 5–15)
BUN: 11 mg/dL (ref 8–23)
CO2: 24 mmol/L (ref 22–32)
Calcium: 8.8 mg/dL — ABNORMAL LOW (ref 8.9–10.3)
Chloride: 103 mmol/L (ref 98–111)
Creatinine, Ser: 1.11 mg/dL — ABNORMAL HIGH (ref 0.44–1.00)
GFR, Estimated: 52 mL/min — ABNORMAL LOW (ref >60)
Glucose, Bld: 104 mg/dL — ABNORMAL HIGH (ref 70–99)
Potassium: 7.4 mmol/L (ref 3.5–5.1)
Sodium: 134 mmol/L — ABNORMAL LOW (ref 135–145)
Total Bilirubin: 1.9 mg/dL — ABNORMAL HIGH (ref 0.3–1.2)
Total Protein: 7.7 g/dL (ref 6.5–8.1)

## 2020-08-14 LAB — CBC WITH DIFFERENTIAL/PLATELET
Abs Immature Granulocytes: 0.02 10*3/uL (ref 0.00–0.07)
Basophils Absolute: 0 10*3/uL (ref 0.0–0.1)
Basophils Relative: 0 %
Eosinophils Absolute: 0.9 10*3/uL — ABNORMAL HIGH (ref 0.0–0.5)
Eosinophils Relative: 13 %
HCT: 42 % (ref 36.0–46.0)
Hemoglobin: 13.7 g/dL (ref 12.0–15.0)
Immature Granulocytes: 0 %
Lymphocytes Relative: 7 %
Lymphs Abs: 0.5 10*3/uL — ABNORMAL LOW (ref 0.7–4.0)
MCH: 30.6 pg (ref 26.0–34.0)
MCHC: 32.6 g/dL (ref 30.0–36.0)
MCV: 93.8 fL (ref 80.0–100.0)
Monocytes Absolute: 0.5 10*3/uL (ref 0.1–1.0)
Monocytes Relative: 7 %
Neutro Abs: 4.9 10*3/uL (ref 1.7–7.7)
Neutrophils Relative %: 73 %
Platelets: 410 10*3/uL — ABNORMAL HIGH (ref 150–400)
RBC: 4.48 MIL/uL (ref 3.87–5.11)
RDW: 14.6 % (ref 11.5–15.5)
WBC: 6.9 10*3/uL (ref 4.0–10.5)
nRBC: 0 % (ref 0.0–0.2)

## 2020-08-14 LAB — RESPIRATORY PANEL BY RT PCR (FLU A&B, COVID)
Influenza A by PCR: NEGATIVE
Influenza B by PCR: NEGATIVE
SARS Coronavirus 2 by RT PCR: NEGATIVE

## 2020-08-14 MED ORDER — BENZONATATE 100 MG PO CAPS
200.0000 mg | ORAL_CAPSULE | Freq: Two times a day (BID) | ORAL | Status: DC | PRN
  Administered 2020-08-14 – 2020-08-21 (×9): 200 mg via ORAL
  Filled 2020-08-14 (×9): qty 2

## 2020-08-14 MED ORDER — FLUTICASONE FUROATE-VILANTEROL 100-25 MCG/INH IN AEPB
1.0000 | INHALATION_SPRAY | Freq: Every day | RESPIRATORY_TRACT | Status: DC
  Administered 2020-08-16 – 2020-08-21 (×6): 1 via RESPIRATORY_TRACT
  Filled 2020-08-14: qty 28

## 2020-08-14 MED ORDER — PANTOPRAZOLE SODIUM 40 MG PO TBEC
40.0000 mg | DELAYED_RELEASE_TABLET | Freq: Every day | ORAL | Status: DC
  Administered 2020-08-14 – 2020-08-21 (×8): 40 mg via ORAL
  Filled 2020-08-14 (×7): qty 1

## 2020-08-14 MED ORDER — SODIUM CHLORIDE 0.9% FLUSH
3.0000 mL | Freq: Two times a day (BID) | INTRAVENOUS | Status: DC
  Administered 2020-08-15 – 2020-08-21 (×12): 3 mL via INTRAVENOUS

## 2020-08-14 MED ORDER — FUROSEMIDE 10 MG/ML IJ SOLN
60.0000 mg | Freq: Two times a day (BID) | INTRAMUSCULAR | Status: DC
  Administered 2020-08-14: 60 mg via INTRAVENOUS
  Filled 2020-08-14: qty 8

## 2020-08-14 MED ORDER — FLUTICASONE-UMECLIDIN-VILANT 200-62.5-25 MCG/INH IN AEPB: 1.0000 | INHALATION_SPRAY | Freq: Every day | RESPIRATORY_TRACT | Status: DC

## 2020-08-14 MED ORDER — UMECLIDINIUM BROMIDE 62.5 MCG/INH IN AEPB
1.0000 | INHALATION_SPRAY | Freq: Every day | RESPIRATORY_TRACT | Status: DC
  Administered 2020-08-16 – 2020-08-21 (×6): 1 via RESPIRATORY_TRACT
  Filled 2020-08-14: qty 7

## 2020-08-14 MED ORDER — FUROSEMIDE 10 MG/ML IJ SOLN
40.0000 mg | Freq: Once | INTRAMUSCULAR | Status: AC
  Administered 2020-08-14: 40 mg via INTRAVENOUS
  Filled 2020-08-14: qty 4

## 2020-08-14 MED ORDER — ATORVASTATIN CALCIUM 40 MG PO TABS
80.0000 mg | ORAL_TABLET | Freq: Every day | ORAL | Status: DC
  Administered 2020-08-14 – 2020-08-21 (×8): 80 mg via ORAL
  Filled 2020-08-14 (×8): qty 2

## 2020-08-14 MED ORDER — POLYETHYLENE GLYCOL 3350 17 G PO PACK: 17.0000 g | PACK | Freq: Every day | ORAL | Status: DC | PRN

## 2020-08-14 MED ORDER — ALBUTEROL SULFATE HFA 108 (90 BASE) MCG/ACT IN AERS
8.0000 | INHALATION_SPRAY | Freq: Once | RESPIRATORY_TRACT | Status: AC
  Administered 2020-08-14: 8 via RESPIRATORY_TRACT
  Filled 2020-08-14: qty 6.7

## 2020-08-14 MED ORDER — SODIUM CHLORIDE (PF) 0.9 % IJ SOLN
INTRAMUSCULAR | Status: AC
  Administered 2020-08-14: 3 mL via INTRAVENOUS
  Filled 2020-08-14: qty 50

## 2020-08-14 MED ORDER — ALBUTEROL SULFATE (2.5 MG/3ML) 0.083% IN NEBU
2.5000 mg | INHALATION_SOLUTION | RESPIRATORY_TRACT | Status: DC | PRN
  Administered 2020-08-14 – 2020-08-18 (×7): 2.5 mg via RESPIRATORY_TRACT
  Filled 2020-08-14 (×7): qty 3

## 2020-08-14 MED ORDER — PREDNISONE 20 MG PO TABS
40.0000 mg | ORAL_TABLET | Freq: Every day | ORAL | Status: DC
  Administered 2020-08-15 – 2020-08-16 (×2): 40 mg via ORAL
  Filled 2020-08-14 (×2): qty 2

## 2020-08-14 MED ORDER — ACETAMINOPHEN 325 MG PO TABS: 650.0000 mg | ORAL_TABLET | Freq: Four times a day (QID) | ORAL | Status: DC | PRN

## 2020-08-14 MED ORDER — ENOXAPARIN SODIUM 40 MG/0.4ML ~~LOC~~ SOLN
40.0000 mg | SUBCUTANEOUS | Status: DC
  Administered 2020-08-14 – 2020-08-17 (×4): 40 mg via SUBCUTANEOUS
  Filled 2020-08-14 (×4): qty 0.4

## 2020-08-14 MED ORDER — IPRATROPIUM-ALBUTEROL 0.5-2.5 (3) MG/3ML IN SOLN
3.0000 mL | Freq: Four times a day (QID) | RESPIRATORY_TRACT | Status: DC
  Administered 2020-08-14 – 2020-08-15 (×2): 3 mL via RESPIRATORY_TRACT
  Filled 2020-08-14 (×2): qty 3

## 2020-08-14 MED ORDER — IPRATROPIUM BROMIDE HFA 17 MCG/ACT IN AERS
4.0000 | INHALATION_SPRAY | Freq: Once | RESPIRATORY_TRACT | Status: AC
  Administered 2020-08-14: 4 via RESPIRATORY_TRACT
  Filled 2020-08-14: qty 12.9

## 2020-08-14 MED ORDER — LEVOTHYROXINE SODIUM 112 MCG PO TABS
112.0000 ug | ORAL_TABLET | Freq: Every day | ORAL | Status: DC
  Administered 2020-08-15 – 2020-08-21 (×7): 112 ug via ORAL
  Filled 2020-08-14 (×7): qty 1

## 2020-08-14 MED ORDER — HYDRALAZINE HCL 20 MG/ML IJ SOLN: 10.0000 mg | Freq: Three times a day (TID) | INTRAMUSCULAR | Status: DC | PRN

## 2020-08-14 MED ORDER — AEROCHAMBER PLUS FLO-VU MEDIUM MISC
1.0000 | Freq: Once | Status: AC
  Administered 2020-08-14: 1
  Filled 2020-08-14: qty 1

## 2020-08-14 MED ORDER — ACETAMINOPHEN 650 MG RE SUPP: 650.0000 mg | Freq: Four times a day (QID) | RECTAL | Status: DC | PRN

## 2020-08-14 MED ORDER — CLOPIDOGREL BISULFATE 75 MG PO TABS
75.0000 mg | ORAL_TABLET | Freq: Every day | ORAL | Status: DC
  Administered 2020-08-14 – 2020-08-21 (×7): 75 mg via ORAL
  Filled 2020-08-14 (×8): qty 1

## 2020-08-14 MED ORDER — IOHEXOL 350 MG/ML SOLN
100.0000 mL | Freq: Once | INTRAVENOUS | Status: AC | PRN
  Administered 2020-08-14: 100 mL via INTRAVENOUS

## 2020-08-14 MED ORDER — HYDROCODONE-ACETAMINOPHEN 5-325 MG PO TABS
1.0000 | ORAL_TABLET | ORAL | Status: DC | PRN
  Administered 2020-08-16 – 2020-08-17 (×2): 2 via ORAL
  Filled 2020-08-14 (×2): qty 2

## 2020-08-14 MED ORDER — SODIUM CHLORIDE 0.9 % IV SOLN
1.0000 g | Freq: Once | INTRAVENOUS | Status: DC
  Filled 2020-08-14: qty 10

## 2020-08-14 MED ORDER — CILOSTAZOL 100 MG PO TABS
100.0000 mg | ORAL_TABLET | Freq: Two times a day (BID) | ORAL | Status: DC
  Administered 2020-08-14 – 2020-08-21 (×14): 100 mg via ORAL
  Filled 2020-08-14 (×15): qty 1

## 2020-08-14 MED ORDER — METHYLPREDNISOLONE SODIUM SUCC 125 MG IJ SOLR
125.0000 mg | Freq: Once | INTRAMUSCULAR | Status: AC
  Administered 2020-08-14: 125 mg via INTRAVENOUS
  Filled 2020-08-14: qty 2

## 2020-08-14 MED ORDER — FAMOTIDINE 20 MG PO TABS
40.0000 mg | ORAL_TABLET | Freq: Every day | ORAL | Status: DC
  Administered 2020-08-14 – 2020-08-20 (×7): 40 mg via ORAL
  Filled 2020-08-14 (×8): qty 2

## 2020-08-14 MED ORDER — TRAZODONE HCL 100 MG PO TABS
100.0000 mg | ORAL_TABLET | Freq: Every day | ORAL | Status: DC
  Administered 2020-08-14 – 2020-08-20 (×7): 100 mg via ORAL
  Filled 2020-08-14 (×7): qty 1

## 2020-08-14 MED ORDER — ASPIRIN EC 81 MG PO TBEC
81.0000 mg | DELAYED_RELEASE_TABLET | Freq: Every day | ORAL | Status: DC
  Administered 2020-08-15 – 2020-08-21 (×7): 81 mg via ORAL
  Filled 2020-08-14 (×7): qty 1

## 2020-08-14 MED ORDER — MONTELUKAST SODIUM 10 MG PO TABS
10.0000 mg | ORAL_TABLET | Freq: Every day | ORAL | Status: DC
  Administered 2020-08-14 – 2020-08-20 (×7): 10 mg via ORAL
  Filled 2020-08-14 (×7): qty 1

## 2020-08-14 MED ORDER — PAROXETINE HCL 20 MG PO TABS
20.0000 mg | ORAL_TABLET | Freq: Every day | ORAL | Status: DC
  Administered 2020-08-14 – 2020-08-21 (×8): 20 mg via ORAL
  Filled 2020-08-14 (×8): qty 1

## 2020-08-14 MED ORDER — MORPHINE SULFATE (PF) 2 MG/ML IV SOLN: 2.0000 mg | INTRAVENOUS | Status: DC | PRN

## 2020-08-14 MED ORDER — LORAZEPAM 1 MG PO TABS
2.0000 mg | ORAL_TABLET | Freq: Every day | ORAL | Status: DC
  Administered 2020-08-14 – 2020-08-20 (×7): 2 mg via ORAL
  Filled 2020-08-14 (×7): qty 2

## 2020-08-14 NOTE — ED Notes (Signed)
Pt placed on cardiac monitoring.  

## 2020-08-14 NOTE — ED Triage Notes (Signed)
Present via EMS with c/o SOB for about one week. States she in on home O2 PRN but the last 2 weeks it has been all the time. EMS found pt to be hypoxic with O2 sat in the 80's. aubible wheezing noted on exam.

## 2020-08-14 NOTE — ED Notes (Signed)
Date and time results received: 08/14/20 11:55 AM  (use smartphrase ".now" to insert current time)  Test: K+ Critical Value: 7.4  Name of Provider Notified: Colletta [place], RN  Orders Received? Or Actions Taken?: Actions Taken: Notified primary RN

## 2020-08-14 NOTE — ED Notes (Signed)
Pt to CT via stretcher

## 2020-08-14 NOTE — H&P (Signed)
History and Physical        Hospital Admission Note Date: 08/14/2020  Patient name: [name] Medical record number: [id number] Date of birth: 29-Nov-1945 Age: 74 y.o. Gender: female  PCP: [place], [name], MD  Patient coming from: Home Lives with: Husband At baseline, ambulates: Independently  Chief Complaint    No chief complaint on file.     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 Donzetta Matters) who presented to the ED with gradually worsening shortness of breath x4 weeks.  She was in her usual state of health until 4 weeks ago when she began having shortness of breath and wheezing initially requiring continuous supplemental O2 but this morning she needed up to 6 LPM prompting her to come to the ED.  She has been having a minimally productive cough with clear sputum and wheezing.  No sick contacts.  Received 1 dose of the [place] Covid vaccine back in March but did not return due to undesired side effects after the first dose.  She was recently hospitalized from 8/6-8/15 for acute on chronic hypoxic respiratory failure secondary to COPD exacerbation and suspected systolic heart failure exacerbation and underwent a left heart cath at the time as well but did not require a stent.  Admits to orthopnea lately and dyspnea on exertion.  States she is feeling a little better currently compared to when she presented.  Denies chest pain, palpitations, nausea or vomiting, abdominal pain or any other complaints.  Of note, she also reported left lower extremity swelling 1 week ago which resolved after using a compression stocking.  ED Course: Afebrile,  SPO2 80% on room air currently on 6 LPM.  Initial labs with K7.4 but noted 5.1 on repeat.  AST 93, ALT 59, T bili 1.9, COVID-19 negative.  CXR: Mild hyperinflation consistent with COPD/emphysema without other acute abnormality.  She was given albuterol inhaler, Solu-Medrol, ipratropium.  Continued to have increased work of breathing and BiPAP was ordered however on my personal assessment she was doing okay so BiPAP was discontinued.  Vitals:   08/14/20 1312 08/14/20 1353  BP: (!) 133/45 (!) 171/69  Pulse: 85 90  Resp: (!) 27 (!) 24  Temp:    SpO2: 96% 95%     Review of Systems:  Review of Systems  Constitutional: Negative for chills and fever.  Respiratory: Positive for cough, shortness of breath and wheezing.   Cardiovascular: Positive for orthopnea and leg swelling. Negative for chest pain and palpitations.  Gastrointestinal: Negative for nausea and vomiting.       Ventral hernia  Genitourinary: Negative.   Musculoskeletal: Negative.   All other systems reviewed and are negative.   Medical/Social/Family History   Past Medical History: Past Medical History:  Diagnosis Date  . Bilateral leg cramps   . Bladder neoplasm   . 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 COPD, frequent  exacerbations ([place])    03-06-2018 per pt last exacerbation 12/ 2018  . Coronary artery disease    cardiologist-  dr Aundra Dubin-- 03-11-2018 er cath , 80% stenosis in the ostial second diagonal  . Dyspnea on minimal exertion   . Emphysema/COPD ([place])    CAT score- 17  . Family history of breast cancer   . Family history of colon cancer   . Family history of melanoma   . Family history of ovarian cancer   . Family history of pancreatic cancer   . Fibromyalgia 1995  . GERD (gastroesophageal reflux disease)   . Hiatal hernia   . History of breast cancer   . History of diverticulitis of colon    2002  s/p  sigmoid colectomy  .  History of multiple pulmonary nodules    hx RUL nodules x2  s/p right VATS w/ wedge resection 09-11-2005 and 06-14-2011  both  necrotizing granulomatous inflammation w/ cystic area of necrosis & focal calcification  . History of right breast cancer oncologist-  dr Jana Hakim-- no recurrence   dx 2010--  IDC, Stage IA , ER/PR+,  (pT1c pN0) 11-09-2008 right lumpectomy;  12-18-2008 right simple mastectomy for DCIS margins;  completed chemotherapy 2010 (no radiation) and completed antiestrogen therapy  . Hx of colonic polyps    Dr. Cristina Gong -last study '11  . Hyperlipidemia   . Hypertension   . Hypothyroidism   . 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 Nocturia   . OA (osteoarthritis)    thumbs  . On supplemental oxygen therapy    03-06-2018 per pt uses only at night,  checks O2 sats at home,  stated am sat 89% after moving around average 93-94% with RA  . Osteoporosis   . Peripheral arterial occlusive disease [facility]) vascular-- dr chen/ dr Donzetta Matters   proximal right SFA severe focal stenosis with collaterals from the PFA/  04/ 2012 occluded celiac and SMA arteries with distal reconstitution w/ patent IMA  . Peripheral vascular disease ([place])    chronic DVT RLE,  mesenteric vascular disease  . Right lower lobe pulmonary nodule    Chest CT 08-29-2017  . Stage 3 severe COPD by GOLD classification ([place]) hx frequent exacerbations--   pulmologist-  dr Maryann Alar--  per lov note , dated 12-20-2017, oxyogen 2L is prescribed for use with exertion (but pt only uses mostly at night), O2 sats on RA run the 80s , this day sat 86% RA and with 2L O2 sat 92%  . Varicose veins of leg with swelling    varicose vein surgery - Dr. Aleda Grana  . Wears glasses   . Wears hearing aid in both ears     Past Surgical History:  Procedure Laterality Date  . ABDOMINAL AORTOGRAM N/A 07/11/2020   Procedure: ABDOMINAL AORTOGRAM;  Surgeon: Waynetta Sandy, MD;  Location: [place] [facility];  Service: Cardiovascular;  Laterality: N/A;  . AORTIC ARCH ANGIOGRAPHY N/A 07/11/2020   Procedure: AORTIC ARCH ANGIOGRAPHY;  Surgeon: Waynetta Sandy, MD;  Location: [place] [facility];  Service: Cardiovascular;  Laterality: N/A;  . BREAST EXCISIONAL BIOPSY Left 10/15/2008  . BREAST LUMPECTOMY W/ NEEDLE LOCALIZATION Right 11-09-2008    dr Brantley Stage   [facility]  . CARDIAC CATHETERIZATION  03-12-2011   dr Aundra Dubin   70-80% ostial stenosis in small second diagonal (appears to small for intervention),  dLAD 40-50%,  minimal luminal irregulartieis involoving LCFx and RCA,  LVEF 55%  .  CATARACT EXTRACTION W/ INTRAOCULAR LENS  IMPLANT, BILATERAL  2011  . CYSTOSCOPY N/A 03/11/2018   Procedure: CYSTOSCOPY WITH INSTILLATION OF POST OPERATIVE EPIRUBICIN;  Surgeon: Festus Aloe, MD;  Location: [facility];  Service: Urology;  Laterality: N/A;  . LEFT HEART CATH AND CORONARY ANGIOGRAPHY N/A 05/25/2020   Procedure: LEFT HEART CATH AND CORONARY ANGIOGRAPHY;  Surgeon: Troy Sine, MD;  Location: [place] [facility];  Service: Cardiovascular;  Laterality: N/A;  . MASTECTOMY Right   . PARTIAL COLECTOMY  2002   sigmoid and appectomy (diverticulitis)  . PARTIAL MASTECTOMY WITH NEEDLE LOCALIZATION Left 02/23/2013   Procedure:  LEFT PARTIAL MASTECTOMY WITH NEEDLE LOCALIZATION;  Surgeon: Adin Hector, MD;  Location: [place];  Service: General;  Laterality: Left;  . PERIPHERAL VASCULAR INTERVENTION  07/11/2020   Procedure: PERIPHERAL VASCULAR INTERVENTION;  Surgeon: Waynetta Sandy, MD;  Location: [place] [facility];  Service: Cardiovascular;;  . RECONSTRUCTION BREAST W/ LATISSIMUS DORSI FLAP Right 05-30-2009   dr Harlow Mares  [facility]  . REDUCTION MAMMAPLASTY Left   . SIMPLE MASTECTOMY Right 12-28-2008    dr Brantley Stage  [facility]  . TRANSTHORACIC ECHOCARDIOGRAM  07-09-2016  dr Aundra Dubin   ef 55-60%, grade 1 diastoic dysfunction/  mild TR  . TRANSURETHRAL RESECTION OF BLADDER TUMOR N/A 03/11/2018    Procedure: TRANSURETHRAL RESECTION OF BLADDER TUMOR (TURBT) 2-5cm;  Surgeon: Festus Aloe, MD;  Location: [facility];  Service: Urology;  Laterality: N/A;  . UPPER EXTREMITY ANGIOGRAPHY Left 07/11/2020   Procedure: Upper Extremity Angiography;  Surgeon: Waynetta Sandy, MD;  Location: [place] [facility];  Service: Cardiovascular;  Laterality: Left;  Marland Kitchen VAGINAL HYSTERECTOMY  1973  . VIDEO ASSISTED THORACOSCOPY (VATS)/WEDGE RESECTION Right 09-11-2005  &  06-14-2011   dr Fara Boros  [facility]   both RUL    Medications: Prior to Admission medications   Medication Sig Start Date End Date Taking? Authorizing Provider  albuterol (VENTOLIN HFA) 108 (90 Base) MCG/ACT inhaler Inhale 2 puffs into the lungs every 6 (six) hours as needed for wheezing or shortness of breath. 04/06/20   Martyn Ehrich, NP  aspirin EC 81 MG tablet Take 81 mg by mouth daily.    [provider]  atorvastatin (LIPITOR) 80 MG tablet TAKE 1 TABLET BY MOUTH EVERY DAY Patient taking differently: Take 80 mg by mouth daily.  03/23/20   [name], NP  benzonatate (TESSALON PERLES) 100 MG capsule Take 1 capsule (100 mg total) by mouth 3 (three) times daily as needed for cough. 05/29/20 05/29/21  Shelly Coss, MD  cilostazol (PLETAL) 100 MG tablet Take 1 tablet (100 mg total) by mouth 2 (two) times daily. 11/23/19   [name], NP  clopidogrel (PLAVIX) 75 MG tablet Take 1 tablet (75 mg total) by mouth daily. 07/11/20 07/11/21  Waynetta Sandy, MD  dextromethorphan-guaiFENesin [facility]) 30-600 MG 12hr tablet Take 1 tablet by mouth 2 (two) times daily.    [provider]  famotidine (PEPCID) 40 MG tablet Take 1 tablet (40 mg total) by mouth at bedtime. 05/29/20   Shelly Coss, MD  fluticasone (FLONASE) 50 MCG/ACT nasal spray USE 1 SPRAY INTO EACH NOSTRIL TWICE A DAY Patient taking differently: Place 2 sprays into both nostrils in the morning and at bedtime.  06/01/20   [place], [name], MD   Fluticasone-Umeclidin-Vilant (TRELEGY ELLIPTA) 200-62.5-25 MCG/INH AEPB Inhale 1 puff into the lungs daily. 04/25/20   Martyn Ehrich, NP  ipratropium-albuterol (DUONEB) 0.5-2.5 (3) MG/3ML SOLN Inhale  3 mLs into the lungs every 6 (six) hours as needed (sob, wheezing). 04/25/20   Martyn Ehrich, NP  levothyroxine (SYNTHROID) 112 MCG tablet TAKE 1 TABLET (112 MCG TOTAL) BY MOUTH DAILY BEFORE BREAKFAST. 06/21/20   [place], [name], MD  LORazepam (ATIVAN) 2 MG tablet Take 1 tablet (2 mg total) by mouth at bedtime. 06/07/20   [place], [name], MD  montelukast (SINGULAIR) 10 MG tablet Take 1 tablet (10 mg total) by mouth at bedtime. 04/29/20   Martyn Ehrich, NP  OXYGEN Inhale 2 L into the lungs at bedtime.    [provider]  pantoprazole (PROTONIX) 40 MG tablet Take 1 tablet (40 mg total) by mouth daily. Patient not taking: Reported on 07/05/2020 05/29/20   Shelly Coss, MD  PARoxetine (PAXIL) 20 MG tablet TAKE 1 TABLET BY MOUTH EVERYDAY AT BEDTIME 08/02/20   [place], [name], MD  traZODone (DESYREL) 100 MG tablet TAKE 1 TABLET BY MOUTH EVERYDAY AT BEDTIME Patient taking differently: Take 100 mg by mouth at bedtime.  06/27/20   [place], [name], MD    Allergies:   Allergies  Allergen Reactions  . Sulfa Antibiotics Swelling    Social History:  reports that she quit smoking about 4 years ago. Her smoking use included cigarettes. She has a 37.50 pack-year smoking history. She has never used smokeless tobacco. She reports current alcohol use of about 10.0 standard drinks of alcohol per week. She reports that she does not use drugs.  Family History: Family History  Problem Relation Age of Onset  . Colon cancer Mother        colon  . COPD Mother        brown lung  . Hypertension Mother   . Diabetes Mother   . Emphysema Mother   . Coronary artery disease Father   . Heart attack Father   . Sudden death Father   . Heart disease Father   . Cancer Sister        throat  .  Hypertension Sister   . Coronary artery disease Brother   . Heart attack Brother        early 20s  . Throat cancer Sister   . Ovarian cancer Sister        fallopian tube cancer in her 48s  . Melanoma Sister   . Hypertension Sister   . Pancreatic cancer Sister   . Melanoma Niece        dx in her 29s  . Breast cancer Niece 27     Objective   Physical Exam: Blood pressure (!) 171/69, pulse 90, temperature 97.8 F (36.6 C), temperature source Oral, resp. rate (!) 24, SpO2 95 %.  Physical Exam Vitals and nursing note reviewed.  Constitutional:      Appearance: Normal appearance. She is normal weight.  HENT:     Head: Normocephalic and atraumatic.  Eyes:     Conjunctiva/sclera: Conjunctivae normal.  Cardiovascular:     Rate and Rhythm: Regular rhythm. Tachycardia present.  Pulmonary:     Comments: Some conversational dyspnea and audible wheezing with dropped to 85% with induced by significant coughing which resolved back to 95% and remained on 6 L Abdominal:     General: Abdomen is flat.     Palpations: Abdomen is soft.     Hernia: A hernia is present.  Musculoskeletal:        General: No swelling or tenderness.  Skin:    Coloration: Skin is not jaundiced or  pale.  Neurological:     Mental Status: She is alert. Mental status is at baseline.  Psychiatric:        Mood and Affect: Mood normal.        Behavior: Behavior normal.     LABS on Admission: I have personally reviewed all the labs and imaging below    Basic Metabolic Panel: Recent Labs  Lab 08/14/20 1057 08/14/20 1221  NA 134* 137  K 7.4* 5.1  CL 103 103  CO2 24  --   GLUCOSE 104* 107*  BUN 11 12  CREATININE 1.11* 0.80  CALCIUM 8.8*  --    Liver Function Tests: Recent Labs  Lab 08/14/20 1057  AST 93*  ALT 59*  ALKPHOS 81  BILITOT 1.9*  PROT 7.7  ALBUMIN 4.0   No results for input(s): LIPASE, AMYLASE in the last 168 hours. No results for input(s): AMMONIA in the last 168 hours. CBC: Recent  Labs  Lab 08/14/20 1057 08/14/20 1221  WBC 6.9  --   NEUTROABS 4.9  --   HGB 13.7 15.0  HCT 42.0 44.0  MCV 93.8  --   PLT 410*  --    Cardiac Enzymes: No results for input(s): CKTOTAL, CKMB, CKMBINDEX, TROPONINI in the last 168 hours. BNP: Invalid input(s): POCBNP CBG: No results for input(s): GLUCAP in the last 168 hours.  Radiological Exams on Admission:  DG Chest Port 1 View  Result Date: 08/14/2020 CLINICAL DATA:  Shortness of breath.  COPD. EXAM: PORTABLE CHEST 1 VIEW COMPARISON:  None. FINDINGS: Postoperative changes in the right apex are identified. No pneumothorax. Mild cardiomegaly. The hila and mediastinum are unremarkable. Mild hyperinflation of the lungs. No other acute abnormalities. IMPRESSION: Postoperative changes in the right apex. No other acute abnormalities. Mild hyperinflation of the lungs consistent with COPD/emphysema. Electronically Signed   By: Dorise Bullion III M.D   On: 08/14/2020 11:59      EKG: Independently reviewed.    A & P   Principal Problem:   Acute respiratory failure with hypoxia (HCC) Active Problems:   Hypothyroidism   Chronic mesenteric ischemia (HCC)   Chronic respiratory failure with hypoxia (HCC)   COPD exacerbation (HCC)   Acute systolic CHF (congestive heart failure) (HCC)   Esophageal dysmotility   1. Acute on chronic hypoxic respiratory failure secondary to COPD exacerbation and suspected heart failure exacerbation a. On room air at baseline with intermittent 2 L nasal cannula, currently on 6 LPM b. Esophageal dysmotility and GERD thought to be a contributing factor as below c. Does not seem to require BiPAP currently but patient is at risk of decompensation so will place in progressive unit d. Wells score for PE: 6 points (moderate/high risk of PE) -> will check a CTA chest since she's hypoxic and recently had unilateral leg swelling and has a distant history of VTE e. If no improvement, then would consider a PCCM  consult. She is a patient of Dr. Chase Caller f. Continue home Trelegy and Singulair g. Continue steroids h. Continue acid suppression i. Scheduled duo nebs and as needed albuterol j. Lasix 40 mg IV k. No antibiotics for now  2. Suspected HFrEF exacerbation a. Evidenced by: Orthopnea, elevated LFTs, hyponatremia, abdominal bloating and reported leg swelling at home b. Recent hospitalization with similar symptoms improved with Lasix c. Tele d. Monitor daily weights and intake/output e. Trial of Lasix IV, if she has improvement then would continue  3. COPD exacerbation a. Plan as above b. Continue GERD  treatment below   4. Elevated LFTs, suspect secondary to heart failure exacerbation  5. CAD  a. Continue aspirin and statin  6. Chronic mesenteric ischemia a. Continue Plavix and Pletal  7. Esophageal dysmotility with GERD a. Noted on esophagram at last hospitalization and thought to be a contributing factor to her hypoxia b. Continue PPI and H2 blocker  8. Hypothyroidism a. On synthroid   DVT prophylaxis: Lovenox   Code Status: DNR  Diet: Heart healthy Family Communication: Admission, patients condition and plan of care including tests being ordered have been discussed with the patient who indicates understanding and agrees with the plan and Code Status. Patient's husband was updated  Disposition Plan: The appropriate patient status for this patient is INPATIENT. Inpatient status is judged to be reasonable and necessary in order to provide the required intensity of service to ensure the patient's safety. The patient's presenting symptoms, physical exam findings, and initial radiographic and laboratory data in the context of their chronic comorbidities is felt to place them at high risk for further clinical deterioration. Furthermore, it is not anticipated that the patient will be medically stable for discharge from the hospital within 2 midnights of admission. The following factors  support the patient status of inpatient.   " The patient's presenting symptoms include shortness of breath, hypoxia, orthopnea. " The worrisome physical exam findings include wheezing, conversational dyspnea. " The initial radiographic and laboratory data are worrisome because of COPD/emphysema. " The chronic co-morbidities include systolic heart failure, esophageal dysmotility, COPD on oxygen at home.   * I certify that at the point of admission it is my clinical judgment that the patient will require inpatient hospital care spanning beyond 2 midnights from the point of admission due to high intensity of service, high risk for further deterioration and high frequency of surveillance required.*   Status is: Inpatient  Remains inpatient appropriate because:IV treatments appropriate due to intensity of illness or inability to take PO and Inpatient level of care appropriate due to severity of illness   Dispo: The patient is from: Home              Anticipated d/c is to: Home              Anticipated d/c date is: 3 days              Patient currently is not medically stable to d/c.         The medical decision making on this patient was of high complexity and the patient is at high risk for clinical deterioration, therefore this is a level 3 admission.  Consultants  . none  Procedures  . none  Time Spent on Admission: 70 minutes    Harold Hedge, DO [facility]  08/14/2020, 2:13 PM

## 2020-08-14 NOTE — ED Provider Notes (Signed)
[facility] DEPT Provider Note   CSN: [id number] Arrival date & time: 08/14/20  1018     History No chief complaint on file.   [name] is a 74 y.o. female w PMHx COPD, R breast cancer, chronic mesenteric ischemia, CAD, CHF, presenting to the ED with complaint of gradually worsening SOB. Patient reports she began feeling shortness of breat almost 4 weeks ago, however symptoms have been progressively worsening.  She states normally she has oxygen supplementation at home only to use as needed, and wears 2 L at bedtime however over the last 2 weeks has been wearing it 24/7.  She endorses a dry cough, wheezing, and difficulty breathing with any exertion or simple tasks.  Symptoms feel similar to COPD exacerbation.  No fevers or chills.  She is only completed 1 Pfizer Covid vaccine back in March and did not return due to undesired side effects after first dose.  The history is provided by the patient.       Past Medical History:  Diagnosis Date  . Bilateral leg cramps   . Bladder neoplasm   . Chronic deep vein thrombosis (DVT) of right lower extremity ([place])    05/ 2018  chronic non-occlusive DVT RLE  . COPD, frequent exacerbations ([place])    03-06-2018 per pt last exacerbation 12/ 2018  . Coronary artery disease    cardiologist-  dr Aundra Dubin-- 03-11-2018 er cath , 80% stenosis in the ostial second diagonal  . Dyspnea on minimal exertion   . Emphysema/COPD ([place])    CAT score- 17  . Family history of breast cancer   . Family history of colon cancer   . Family history of melanoma   . Family history of ovarian cancer   . Family history of pancreatic cancer   . Fibromyalgia 1995  . GERD (gastroesophageal reflux disease)   . Hiatal hernia   . History of breast cancer   . History of diverticulitis of colon    2002  s/p  sigmoid colectomy  . History of multiple pulmonary nodules    hx RUL nodules x2  s/p right VATS w/ wedge resection 09-11-2005 and  06-14-2011  both  necrotizing granulomatous inflammation w/ cystic area of necrosis & focal calcification  . History of right breast cancer oncologist-  dr Jana Hakim-- no recurrence   dx 2010--  IDC, Stage IA , ER/PR+,  (pT1c pN0) 11-09-2008 right lumpectomy;  12-18-2008 right simple mastectomy for DCIS margins;  completed chemotherapy 2010 (no radiation) and completed antiestrogen therapy  . Hx of colonic polyps    Dr. Cristina Gong -last study '11  . Hyperlipidemia   . Hypertension   . Hypothyroidism   . 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 Nocturia   . OA (osteoarthritis)    thumbs  . On supplemental oxygen therapy    03-06-2018 per pt uses only at night,  checks O2 sats at home,  stated am sat 89% after moving around average 93-94% with RA  . Osteoporosis   . Peripheral arterial occlusive disease [facility]) vascular-- dr chen/ dr Donzetta Matters   proximal right SFA severe focal stenosis with collaterals from the PFA/  04/ 2012 occluded celiac and SMA arteries with distal reconstitution w/ patent IMA  . Peripheral vascular disease ([place])    chronic DVT RLE,  mesenteric vascular disease  . Right lower lobe pulmonary nodule    Chest CT 08-29-2017  . Stage 3 severe COPD by GOLD  classification ([place]) hx frequent exacerbations--   pulmologist-  dr Maryann Alar--  per lov note , dated 12-20-2017, oxyogen 2L is prescribed for use with exertion (but pt only uses mostly at night), O2 sats on RA run the 80s , this day sat 86% RA and with 2L O2 sat 92%  . Varicose veins of leg with swelling    varicose vein surgery - Dr. Aleda Grana  . Wears glasses   . Wears hearing aid in both ears     Patient Active Problem List   Diagnosis Date Noted  . Acute respiratory failure with hypoxia ([place]) 08/14/2020  . Snoring 06/26/2020  . HFrEF (heart failure with reduced ejection fraction) ([place]) 06/07/2020  . Shortness of breath   . Acute systolic CHF (congestive heart failure) ([place]) 05/21/2020   . Major depressive disorder, single episode, moderate degree ([place]) 05/21/2020  . COPD exacerbation ([place]) 05/20/2020  . Acute on chronic respiratory failure with hypoxia ([place]) 05/20/2020  . Genetic testing 12/12/2018  . Family history of breast cancer   . Family history of ovarian cancer   . Family history of pancreatic cancer   . Family history of colon cancer   . Family history of melanoma   . History of breast cancer   . Chronic respiratory failure with hypoxia ([place]) 10/14/2017  . COPD with acute exacerbation ([place]) 10/10/2017  . Flu vaccine need 09/09/2017  . Anxiety disorder 09/09/2017  . Fibromyalgia 09/09/2017  . Thrombocytosis 06/21/2017  . Malignant neoplasm of overlapping sites of right breast in female, estrogen receptor positive ([place]) 06/05/2017  . Dermatitis 04/02/2017  . Edema of right lower extremity 02/26/2017  . Positive ANA (antinuclear antibody) 02/08/2017  . Elevated IgE level and eosinophilia 12/31/2016  . Oral thrush 11/28/2016  . Rash and nonspecific skin eruption 11/28/2016  . Allergic rhinitis 11/28/2016  . History of bacterial pneumonia 08/09/2016  . Routine general medical examination at a health care facility 08/12/2015  . Insomnia 03/04/2015  . Breast cancer, right breast ([place]) 02/17/2015  . CAD (coronary artery disease) 07/11/2011  . Tobacco abuse 03/27/2011  . Chronic mesenteric ischemia ([place]) 03/27/2011  . Solitary pulmonary nodule 03/27/2011  . PAD (peripheral artery disease) ([place]) 03/04/2011  . Arthropathy 11/09/2010  . Hypothyroidism 09/20/2009  . Hyperlipidemia 09/20/2009  . Hearing loss 09/14/2008  . Essential hypertension 09/08/2007  . COPD, frequent exacerbations ([place]) 09/08/2007  . GERD 09/08/2007    Past Surgical History:  Procedure Laterality Date  . ABDOMINAL AORTOGRAM N/A 07/11/2020   Procedure: ABDOMINAL AORTOGRAM;  Surgeon: Waynetta Sandy, MD;  Location: [facility];  Service: Cardiovascular;  Laterality: N/A;   . AORTIC ARCH ANGIOGRAPHY N/A 07/11/2020   Procedure: AORTIC ARCH ANGIOGRAPHY;  Surgeon: Waynetta Sandy, MD;  Location: [facility];  Service: Cardiovascular;  Laterality: N/A;  . BREAST EXCISIONAL BIOPSY Left 10/15/2008  . BREAST LUMPECTOMY W/ NEEDLE LOCALIZATION Right 11-09-2008    dr Brantley Stage   [facility]  . CARDIAC CATHETERIZATION  03-12-2011   dr Aundra Dubin   70-80% ostial stenosis in small second diagonal (appears to small for intervention),  dLAD 40-50%,  minimal luminal irregulartieis involoving LCFx and RCA,  LVEF 55%  . CATARACT EXTRACTION W/ INTRAOCULAR LENS  IMPLANT, BILATERAL  2011  . CYSTOSCOPY N/A 03/11/2018   Procedure: CYSTOSCOPY WITH INSTILLATION OF POST OPERATIVE EPIRUBICIN;  Surgeon: Festus Aloe, MD;  Location: [facility];  Service: Urology;  Laterality: N/A;  . LEFT HEART CATH AND CORONARY ANGIOGRAPHY N/A 05/25/2020   Procedure: LEFT HEART  CATH AND CORONARY ANGIOGRAPHY;  Surgeon: Troy Sine, MD;  Location: [facility];  Service: Cardiovascular;  Laterality: N/A;  . MASTECTOMY Right   . PARTIAL COLECTOMY  2002   sigmoid and appectomy (diverticulitis)  . PARTIAL MASTECTOMY WITH NEEDLE LOCALIZATION Left 02/23/2013   Procedure:  LEFT PARTIAL MASTECTOMY WITH NEEDLE LOCALIZATION;  Surgeon: Adin Hector, MD;  Location: [facility];  Service: General;  Laterality: Left;  . PERIPHERAL VASCULAR INTERVENTION  07/11/2020   Procedure: PERIPHERAL VASCULAR INTERVENTION;  Surgeon: Waynetta Sandy, MD;  Location: [facility];  Service: Cardiovascular;;  . RECONSTRUCTION BREAST W/ LATISSIMUS DORSI FLAP Right 05-30-2009   dr Harlow Mares  [facility]  . REDUCTION MAMMAPLASTY Left   . SIMPLE MASTECTOMY Right 12-28-2008    dr Brantley Stage  [facility]  . TRANSTHORACIC ECHOCARDIOGRAM  07-09-2016  dr Aundra Dubin   ef 55-60%, grade 1 diastoic dysfunction/  mild TR  . TRANSURETHRAL RESECTION OF BLADDER TUMOR N/A 03/11/2018   Procedure: TRANSURETHRAL RESECTION OF BLADDER  TUMOR (TURBT) 2-5cm;  Surgeon: Festus Aloe, MD;  Location: [facility];  Service: Urology;  Laterality: N/A;  . UPPER EXTREMITY ANGIOGRAPHY Left 07/11/2020   Procedure: Upper Extremity Angiography;  Surgeon: Waynetta Sandy, MD;  Location: [facility];  Service: Cardiovascular;  Laterality: Left;  Marland Kitchen VAGINAL HYSTERECTOMY  1973  . VIDEO ASSISTED THORACOSCOPY (VATS)/WEDGE RESECTION Right 09-11-2005  &  06-14-2011   dr hendrickso  [facility]   both RUL     OB History   No obstetric history on file.     Family History  Problem Relation Age of Onset  . Colon cancer Mother        colon  . COPD Mother        brown lung  . Hypertension Mother   . Diabetes Mother   . Emphysema Mother   . Coronary artery disease Father   . Heart attack Father   . Sudden death Father   . Heart disease Father   . Cancer Sister        throat  . Hypertension Sister   . Coronary artery disease Brother   . Heart attack Brother        early 66s  . Throat cancer Sister   . Ovarian cancer Sister        fallopian tube cancer in her 41s  . Melanoma Sister   . Hypertension Sister   . Pancreatic cancer Sister   . Melanoma Niece        dx in her 34s  . Breast cancer Niece 42    Social History   Tobacco Use  . Smoking status: Former Smoker    Packs/day: 0.75    Years: 50.00    Pack years: 37.50    Types: Cigarettes    Quit date: 05/18/2016    Years since quitting: 4.2  . Smokeless tobacco: Never Used  Vaping Use  . Vaping Use: Never used  Substance Use Topics  . Alcohol use: Yes    Alcohol/week: 10.0 standard drinks    Types: 10 Cans of beer per week  . Drug use: No    Home Medications Prior to Admission medications   Medication Sig Start Date End Date Taking? Authorizing Provider  albuterol (VENTOLIN HFA) 108 (90 Base) MCG/ACT inhaler Inhale 2 puffs into the lungs every 6 (six) hours as needed for wheezing or shortness of breath. 04/06/20   Martyn Ehrich, NP  aspirin EC 81 MG  tablet  Take 81 mg by mouth daily.    [provider]  atorvastatin (LIPITOR) 80 MG tablet TAKE 1 TABLET BY MOUTH EVERY DAY Patient taking differently: Take 80 mg by mouth daily.  03/23/20   [name], NP  benzonatate (TESSALON PERLES) 100 MG capsule Take 1 capsule (100 mg total) by mouth 3 (three) times daily as needed for cough. 05/29/20 05/29/21  Shelly Coss, MD  cilostazol (PLETAL) 100 MG tablet Take 1 tablet (100 mg total) by mouth 2 (two) times daily. 11/23/19   [name], NP  clopidogrel (PLAVIX) 75 MG tablet Take 1 tablet (75 mg total) by mouth daily. 07/11/20 07/11/21  Waynetta Sandy, MD  dextromethorphan-guaiFENesin [facility] DM) 30-600 MG 12hr tablet Take 1 tablet by mouth 2 (two) times daily.    [provider]  famotidine (PEPCID) 40 MG tablet Take 1 tablet (40 mg total) by mouth at bedtime. 05/29/20   Shelly Coss, MD  fluticasone (FLONASE) 50 MCG/ACT nasal spray USE 1 SPRAY INTO EACH NOSTRIL TWICE A DAY Patient taking differently: Place 2 sprays into both nostrils in the morning and at bedtime.  06/01/20   [place], [name], MD  Fluticasone-Umeclidin-Vilant (TRELEGY ELLIPTA) 200-62.5-25 MCG/INH AEPB Inhale 1 puff into the lungs daily. 04/25/20   Martyn Ehrich, NP  ipratropium-albuterol (DUONEB) 0.5-2.5 (3) MG/3ML SOLN Inhale 3 mLs into the lungs every 6 (six) hours as needed (sob, wheezing). 04/25/20   Martyn Ehrich, NP  levothyroxine (SYNTHROID) 112 MCG tablet TAKE 1 TABLET (112 MCG TOTAL) BY MOUTH DAILY BEFORE BREAKFAST. 06/21/20   [place], [name], MD  LORazepam (ATIVAN) 2 MG tablet Take 1 tablet (2 mg total) by mouth at bedtime. 06/07/20   [place], [name], MD  montelukast (SINGULAIR) 10 MG tablet Take 1 tablet (10 mg total) by mouth at bedtime. 04/29/20   Martyn Ehrich, NP  OXYGEN Inhale 2 L into the lungs at bedtime.    [provider]  pantoprazole (PROTONIX) 40 MG tablet Take 1 tablet (40 mg total) by mouth daily. Patient not  taking: Reported on 07/05/2020 05/29/20   Shelly Coss, MD  PARoxetine (PAXIL) 20 MG tablet TAKE 1 TABLET BY MOUTH EVERYDAY AT BEDTIME 08/02/20   [place], [name], MD  traZODone (DESYREL) 100 MG tablet TAKE 1 TABLET BY MOUTH EVERYDAY AT BEDTIME Patient taking differently: Take 100 mg by mouth at bedtime.  06/27/20   [place], [name], MD    Allergies    Sulfa antibiotics  Review of Systems   Review of Systems  Respiratory: Positive for cough, chest tightness, shortness of breath and wheezing.   All other systems reviewed and are negative.   Physical Exam Updated Vital Signs BP (!) 133/45   Pulse 85   Temp 97.8 F (36.6 C) (Oral)   Resp (!) 27   SpO2 96%   Physical Exam Vitals and nursing note reviewed.  Constitutional:      Appearance: She is well-developed.  HENT:     Head: Normocephalic and atraumatic.  Eyes:     Conjunctiva/sclera: Conjunctivae normal.  Cardiovascular:     Rate and Rhythm: Normal rate and regular rhythm.  Pulmonary:     Effort: Respiratory distress present.     Comments: Accessory muscle usage.  Only able to speak in 1-2 word phrases.  Audible expiratory wheeze Lung sounds are diminished bilaterally with wheezes Abdominal:     General: Bowel sounds are normal.     Palpations: Abdomen is soft.  Tenderness: There is no abdominal tenderness.  Musculoskeletal:     Right lower leg: No edema.     Left lower leg: No edema.  Skin:    General: Skin is warm.  Neurological:     Mental Status: She is alert.  Psychiatric:        Behavior: Behavior normal.     ED Results / Procedures / Treatments   Labs (all labs ordered are listed, but only abnormal results are displayed) Labs Reviewed  COMPREHENSIVE METABOLIC PANEL - Abnormal; Notable for the following components:      Result Value   Sodium 134 (*)    Potassium 7.4 (*)    Glucose, Bld 104 (*)    Creatinine, Ser 1.11 (*)    Calcium 8.8 (*)    AST 93 (*)    ALT 59 (*)    Total Bilirubin 1.9 (*)     GFR, Estimated 52 (*)    All other components within normal limits  CBC WITH DIFFERENTIAL/PLATELET - Abnormal; Notable for the following components:   Platelets 410 (*)    Lymphs Abs 0.5 (*)    Eosinophils Absolute 0.9 (*)    All other components within normal limits  I-STAT CHEM 8, ED - Abnormal; Notable for the following components:   Glucose, Bld 107 (*)    All other components within normal limits  RESPIRATORY PANEL BY RT PCR (FLU A&B, COVID)    EKG EKG Interpretation  Date/Time:  Sunday August 14 2020 10:31:47 EDT Ventricular Rate:  91 PR Interval:    QRS Duration: 87 QT Interval:  372 QTC Calculation: 458 R Axis:   81 Text Interpretation: Sinus rhythm Anterior infarct, old Confirmed by Gerlene Fee [phone]) on 08/14/2020 11:48:02 AM   Radiology DG Chest Port 1 View  Result Date: 08/14/2020 CLINICAL DATA:  Shortness of breath.  COPD. EXAM: PORTABLE CHEST 1 VIEW COMPARISON:  None. FINDINGS: Postoperative changes in the right apex are identified. No pneumothorax. Mild cardiomegaly. The hila and mediastinum are unremarkable. Mild hyperinflation of the lungs. No other acute abnormalities. IMPRESSION: Postoperative changes in the right apex. No other acute abnormalities. Mild hyperinflation of the lungs consistent with COPD/emphysema. Electronically Signed   By: Dorise Bullion III M.D   On: 08/14/2020 11:59    Procedures .Critical Care Performed by: Moria Brophy, [place] N, PA-C Authorized by: Jaquilla Woodroof, [place] N, PA-C   Critical care provider statement:    Critical care time (minutes):  45   Critical care time was exclusive of:  Separately billable procedures and treating other patients and teaching time   Critical care was necessary to treat or prevent imminent or life-threatening deterioration of the following conditions:  Respiratory failure   Critical care was time spent personally by me on the following activities:  Discussions with consultants, evaluation of patient's  response to treatment, examination of patient, ordering and performing treatments and interventions, ordering and review of laboratory studies, ordering and review of radiographic studies, pulse oximetry, re-evaluation of patient's condition, obtaining history from patient or surrogate and review of old charts   I assumed direction of critical care for this patient from another provider in my specialty: no     (including critical care time)  Medications Ordered in ED Medications  methylPREDNISolone sodium succinate (SOLU-MEDROL) 125 mg/2 mL injection 125 mg (125 mg Intravenous Given 08/14/20 1111)  albuterol (VENTOLIN HFA) 108 (90 Base) MCG/ACT inhaler 8 puff (8 puffs Inhalation Given 08/14/20 1112)  ipratropium (ATROVENT HFA) inhaler 4 puff (4 puffs  Inhalation Given 08/14/20 1128)  AeroChamber Plus Flo-Vu Medium MISC 1 each (1 each Other Given 08/14/20 1112)    ED Course  I have reviewed the triage vital signs and the nursing notes.  Pertinent labs & imaging results that were available during my care of the patient were reviewed by me and considered in my medical decision making (see chart for details).  Clinical Course as of Aug 14 1321  Sun Aug 14, 2020  1307 Dr. Neysa Bonito accepting admission   [JR]    Clinical Course User Index [JR] Abdon Petrosky, [place] N, PA-C   MDM Rules/Calculators/A&P                          Patient with history of COPD, presenting in acute respiratory distress, hypoxic at 80% on room air, satting 93% on 5 L nasal cannula.  She has increased respiratory effort with accessory muscle usage.  She is able to speak in only 1-2 word phrases, audible wheezes.  Lung sounds are diminished bilaterally.  Symptoms feel similar to prior COPD exacerbation.  Covid test sent.  Will treat with IV Solu-Medrol, inhaled ipratropium and albuterol initially pending Covid result.  Labs and chest x-ray ordered.  Attending Dr. Sedonia Small made aware of patient.  Chest x-ray is consistent with COPD  exacerbation. COVID swab neg.  Initial metabolic panel with critically high potassium level, re-checked for confirmation and appears within normal limits at 5.1. Patient will be placed on bipap for resp failure- admitted to hospitalist service for further management.   Final Clinical Impression(s) / ED Diagnoses Final diagnoses:  Acute respiratory failure with hypoxia ([place])  COPD exacerbation [facility])    Rx / DC Orders ED Discharge Orders    None       Uno Esau, [place] N, PA-C 08/14/20 1322    Maudie Flakes, MD 08/14/20 [phone]

## 2020-08-14 NOTE — ED Notes (Signed)
Pt resting comfortably with husband at bedside. Breathing much improved. O2 down to 2L per cannula. Awaiting bed assignment.

## 2020-08-14 NOTE — ED Notes (Signed)
Dinner meal tray  Provided.

## 2020-08-15 ENCOUNTER — Other Ambulatory Visit: Payer: Self-pay

## 2020-08-15 ENCOUNTER — Encounter (HOSPITAL_COMMUNITY): Payer: Self-pay | Admitting: Internal Medicine

## 2020-08-15 LAB — CBC
HCT: 45.8 % (ref 36.0–46.0)
Hemoglobin: 14.9 g/dL (ref 12.0–15.0)
MCH: 30.1 pg (ref 26.0–34.0)
MCHC: 32.5 g/dL (ref 30.0–36.0)
MCV: 92.5 fL (ref 80.0–100.0)
Platelets: 381 10*3/uL (ref 150–400)
RBC: 4.95 MIL/uL (ref 3.87–5.11)
RDW: 14.3 % (ref 11.5–15.5)
WBC: 6.2 10*3/uL (ref 4.0–10.5)
nRBC: 0 % (ref 0.0–0.2)

## 2020-08-15 LAB — BASIC METABOLIC PANEL
Anion gap: 12 (ref 5–15)
BUN: 19 mg/dL (ref 8–23)
CO2: 22 mmol/L (ref 22–32)
Calcium: 7.9 mg/dL — ABNORMAL LOW (ref 8.9–10.3)
Chloride: 99 mmol/L (ref 98–111)
Creatinine, Ser: 0.94 mg/dL (ref 0.44–1.00)
GFR, Estimated: >60 mL/min (ref >60)
Glucose, Bld: 78 mg/dL (ref 70–99)
Potassium: 4.2 mmol/L (ref 3.5–5.1)
Sodium: 133 mmol/L — ABNORMAL LOW (ref 135–145)

## 2020-08-15 MED ORDER — DOXYCYCLINE HYCLATE 100 MG PO TABS
100.0000 mg | ORAL_TABLET | Freq: Two times a day (BID) | ORAL | Status: DC
  Administered 2020-08-15 – 2020-08-21 (×13): 100 mg via ORAL
  Filled 2020-08-15 (×13): qty 1

## 2020-08-15 MED ORDER — FUROSEMIDE 10 MG/ML IJ SOLN
60.0000 mg | Freq: Once | INTRAMUSCULAR | Status: AC
  Administered 2020-08-15: 60 mg via INTRAVENOUS
  Filled 2020-08-15: qty 6

## 2020-08-15 MED ORDER — IPRATROPIUM-ALBUTEROL 0.5-2.5 (3) MG/3ML IN SOLN
3.0000 mL | RESPIRATORY_TRACT | Status: DC
  Administered 2020-08-15 – 2020-08-16 (×7): 3 mL via RESPIRATORY_TRACT
  Filled 2020-08-15 (×7): qty 3

## 2020-08-15 NOTE — Evaluation (Signed)
Physical Therapy Evaluation Patient Details Name: [name] MRN: [id number] DOB: 1946/04/06 Today's Date: 08/15/2020   History of Present Illness  74 year old Caucasian female with PMH COPD (on 2 L chronic nasal cannula at home), ongoing grief/mourning due to son passing away September 2020, hard of hearing, PVD, hypothyroidism, hypertension, hyperlipidemia, diverticulosis, CAD who presented to the hospital with worsening shortness of breath at home x~4 weeks.  Pt has had past hospitalization for dyspnea and shortness of breath with last in August 2021, has been on multiple rounds of steroids and antibiotics.  Pt admitted for COPD exacerbation.  Recent H/O L Heart Cath/Angiography 05-25-20  Clinical Impression  On eval, pt was Supv level to stand but she was unable to tolerate any further activity with PT. Increased dyspnea and work of breathing sitting EOB and with static standing at bedside. O2 94% on 6L seated at EOB, 88% on 6L with standing at bedside. Will plan to follow and progress activity as tolerated.     Follow Up Recommendations Home health PT;Supervision for mobility/OOB (depending on progress-may not need PT)    Equipment Recommendations  None recommended by PT    Recommendations for Other Services       Precautions / Restrictions Precautions Precautions: Fall Precaution Comments: O2 dep. Monitor O2 levels Restrictions Weight Bearing Restrictions: No      Mobility  Bed Mobility               General bed mobility comments: Pt sitting EOB.    Transfers Overall transfer level: Needs assistance  Transfers: Sit to/from Stand Sit to Stand: Supervision        General transfer comment: Pt stood statically at EOB for ~30 seconds before needing to sit down. No LOB. Dyspnea 3/4. O2 88% on 6L [place].  Ambulation/Gait             General Gait Details: NT-unable 2* current respiratory status.  Stairs            Wheelchair Mobility    Modified Rankin  (Stroke Patients Only)       Balance Overall balance assessment: Needs assistance Sitting-balance support: No upper extremity supported;Feet supported Sitting balance-Leahy Scale: Normal     Standing balance support: Single extremity supported Standing balance-Leahy Scale: Fair                               Pertinent Vitals/Pain Pain Assessment: No/denies pain    Home Living Family/patient expects to be discharged to:: Private residence Living Arrangements: Spouse/significant other;Children Available Help at Discharge: Family;Available 24 hours/day Type of Home: House       Home Layout: Two level;1/2 bath on main level Home Equipment: Cane - single point      Prior Function Level of Independence: Needs assistance      ADL's / Homemaking Assistance Needed: Reports that self care has become increasingly diffcult due to shortness of breath and resultant limited energy.  Comments: pt reports she is quite active and independent at her baseline     Hand Dominance   Dominant Hand: Right    Extremity/Trunk Assessment   Upper Extremity Assessment Upper Extremity Assessment: Defer to OT evaluation    Lower Extremity Assessment Lower Extremity Assessment: Overall WFL for tasks assessed    Cervical / Trunk Assessment Cervical / Trunk Assessment: Normal  Communication   Communication: HOH;No difficulties  Cognition Arousal/Alertness: Awake/alert Behavior During Therapy: WFL for tasks assessed/performed Overall Cognitive  Status: Within Functional Limits for tasks assessed                                 General Comments: Pleasant, cooperative, A&Ox4      General Comments General comments (skin integrity, edema, etc.): Pt endorces general feeling of unsteadiness with standing and ambulation.    Exercises     Assessment/Plan    PT Assessment Patient needs continued PT services  PT Problem List Decreased mobility;Decreased activity  tolerance;Decreased knowledge of use of DME;Cardiopulmonary status limiting activity       PT Treatment Interventions DME instruction;Gait training;Therapeutic exercise;Therapeutic activities;Patient/family education;Balance training;Functional mobility training    PT Goals (Current goals can be found in the Care Plan section)  Acute Rehab PT Goals Patient Stated Goal: Be able to take a shower and perform toileting hygiene without becoming overly fatigued and short of breath. PT Goal Formulation: With patient/family Time For Goal Achievement: 08/29/20 Potential to Achieve Goals: Good    Frequency Min 3X/week   Barriers to discharge        Co-evaluation               AM-PAC PT "6 Clicks" Mobility  Outcome Measure Help needed turning from your back to your side while in a flat bed without using bedrails?: A Little Help needed moving from lying on your back to sitting on the side of a flat bed without using bedrails?: A Little Help needed moving to and from a bed to a chair (including a wheelchair)?: A Little Help needed standing up from a chair using your arms (e.g., wheelchair or bedside chair)?: A Little Help needed to walk in hospital room?: A Little Help needed climbing 3-5 steps with a railing? : A Little 6 Click Score: 18    End of Session Equipment Utilized During Treatment: Oxygen Activity Tolerance:  (Limited by dyspnea/work of breathing) Patient left: with call bell/phone within reach;in bed;with family/visitor present   PT Visit Diagnosis: Difficulty in walking, not elsewhere classified (R26.2);Unsteadiness on feet (R26.81)    Time: 1937-9024 PT Time Calculation (min) (ACUTE ONLY): 10 min   Charges:   PT Evaluation $PT Eval Low Complexity: [place], PT Acute Rehabilitation  Office: [phone] Pager: [phone]

## 2020-08-15 NOTE — Evaluation (Signed)
Occupational Therapy Evaluation Patient Details Name: [name] MRN: [id number] DOB: [birth year] Today's Date: 08/15/2020    History of Present Illness [name] is a 74 year old Caucasian female with PMH COPD (on 2 L chronic nasal cannula at home), ongoing grief/mourning due to son passing away September 2020, hard of hearing, PVD, hypothyroidism, hypertension, hyperlipidemia, diverticulosis, CAD who presented to the hospital with worsening shortness of breath at home x~4 weeks.  Pt has had past hospitalization for dyspnea and shortness of breath with last in August 2021, has been on multiple rounds of steroids and antibiotics.  Pt admitted for COPD exacerbation.  Recent H/O L Heart Cath/Angiography 05-25-20   Clinical Impression   Patient is currently requiring assistance with ADLs including minimal assist to min guard with LE dressing, bathing, and toileting, all of which is below patient's typical baseline of being Independent.  During this evaluation, patient was limited by decreased activity tolerance with dependence on supplemental O2 to 6L on HFNC (pt's baseline is at 2L on [place] at home), as well as a cough when trying to speak or laugh, and unsteadiness with standing, all of which has the potential to impact patient's safety and independence during functional mobility, as well as performance for ADLs. [name] "6-clicks" Daily Activity Inpatient Short Form score of 19/24 indicates 42.80% ADL impairment this session. Patient lives with her spouse and son, who are able to provide 24/7 supervision and assistance.  Patient demonstrates good rehab potential, and should benefit from continued skilled occupational therapy services while in acute care to maximize safety, independence and quality of life at home.  Continued occupational therapy services in the home is recommended.  ?     Follow Up Recommendations  Home health OT    Equipment Recommendations  3 in 1 bedside  commode;Other (comment) (Attachable bidet; adaptive equipment: reacher, long shoe horn, long sponge, sock aid)    Recommendations for Other Services       Precautions / Restrictions Precautions Precautions: Fall Restrictions Weight Bearing Restrictions: No      Mobility Bed Mobility               General bed mobility comments: Pt already sitting EOB.    Transfers Overall transfer level: Needs assistance Equipment used: 1 person hand held assist Transfers: Sit to/from [facility] Sit to Stand: Min guard Stand pivot transfers: Min assist            Balance Overall balance assessment: Needs assistance Sitting-balance support: No upper extremity supported;Feet supported Sitting balance-Leahy Scale: Good     Standing balance support: Single extremity supported Standing balance-Leahy Scale: Poor                             ADL either performed or assessed with clinical judgement   ADL Overall ADL's : Needs assistance/impaired Eating/Feeding: Independent;Sitting   Grooming: Set up;Sitting Grooming Details (indicate cue type and reason): Pt reports that currently, standing at sink for 2 grooming tasks would be "impossible" and an RPE (rate of perceived exertion) score of max 10/10.  Pt educated to keep chair by sink and sit whenever possible for grooming. Upper Body Bathing: Supervision/ safety;Set up;Sitting Upper Body Bathing Details (indicate cue type and reason): Verbal cues for pacing. Lower Body Bathing: Cueing for compensatory techniques;Sit to/from stand;Minimal assistance Lower Body Bathing Details (indicate cue type and reason): Pt educated on use of long bath brush/sponge and use of 3-in-1 BSC  for energy conservation in shower. Cautioned to avoid extreme temps of water, but to keep room warm in cold weather during bathing. Upper Body Dressing : Min guard;Sitting;Set up   Lower Body Dressing: Minimal assistance;Set up;Sit to/from  stand Lower Body Dressing Details (indicate cue type and reason): Pt able to demo figure 4 technique while sitting EOB for seated LB dressing, but reports unsteadiness with standing trial and need of Min guard for safety with 1 HHA. Toilet Transfer: Minimal assistance;BSC;Stand-pivot Armed forces technical officer Details (indicate cue type and reason): Min HHA Toileting- Clothing Manipulation and Hygiene: Minimal assistance;Sit to/from stand Toileting - Clothing Manipulation Details (indicate cue type and reason): Min As based on general assessment and impaired activity tolerance. Pt educated on attachable bidet to assist with hygiene while conserving energy. Pt ed on available vendors in area and on general installation.     Functional mobility during ADLs: Min guard;Minimal assistance General ADL Comments: Hand held assist.     Vision   Vision Assessment?: No apparent visual deficits     Perception     Praxis      Pertinent Vitals/Pain Pain Assessment: No/denies pain     Hand Dominance Right   Extremity/Trunk Assessment Upper Extremity Assessment Upper Extremity Assessment: Overall WFL for tasks assessed   Lower Extremity Assessment Lower Extremity Assessment: Defer to PT evaluation   Cervical / Trunk Assessment Cervical / Trunk Assessment: Normal   Communication Communication Communication: HOH;No difficulties   Cognition Arousal/Alertness: Awake/alert Behavior During Therapy: WFL for tasks assessed/performed Overall Cognitive Status: Within Functional Limits for tasks assessed                                 General Comments: Pleasant, cooperative, A&Ox4   General Comments  Pt endorces general feeling of unsteadiness with standing and ambulation.    Exercises     Shoulder Instructions      Home Living Family/patient expects to be discharged to:: Private residence Living Arrangements: Spouse/significant other;Children Available Help at Discharge:  Family;Available 24 hours/day Type of Home: House       Home Layout: Two level;1/2 bath on main level Alternate Level Stairs-Number of Steps: flight (16)   Bathroom Shower/Tub: Occupational psychologist: Standard     Home Equipment: Cane - single point          Prior Functioning/Environment Level of Independence: Needs assistance    ADL's / Homemaking Assistance Needed: Reports that self care has become increasingly diffcult due to shortness of breath and resultant limited energy. Communication / Swallowing Assistance Needed: No deficits Comments: pt reports she is quite active and independent at her baseline        OT Problem List: Decreased activity tolerance;Impaired balance (sitting and/or standing);Decreased knowledge of use of DME or AE;Cardiopulmonary status limiting activity      OT Treatment/Interventions: Self-care/ADL training;Therapeutic exercise;Therapeutic activities;Energy conservation;DME and/or AE instruction;Patient/family education;Balance training    OT Goals(Current goals can be found in the care plan section) Acute Rehab OT Goals Patient Stated Goal: Be able to take a shower and perform toileting hygiene without becoming overly fatigued and short of breath. OT Goal Formulation: With patient Time For Goal Achievement: 08/29/20 Potential to Achieve Goals: Good ADL Goals Pt Will Perform Lower Body Bathing: sitting/lateral leans;sit to/from stand;with adaptive equipment;with supervision Pt Will Perform Lower Body Dressing: with adaptive equipment;sitting/lateral leans;sit to/from stand;with modified independence Pt Will Transfer to Toilet: with modified independence;ambulating Additional  ADL Goal #1: Pt will stand for grooming/hygiene tasks (goal of ompletion of 2) with RPE (rate of perceived exertion 0-10 scale) no greater than 5 and VSS, to demonstrate improved activity tolerance. Additional ADL Goal #3: Pt will identify at least 3 energy  conservation techniques that she will employ at home in order to maximize function while preventing relapse and rehospitalization.  OT Frequency: Min 2X/week   Barriers to D/C:            Co-evaluation              AM-PAC OT "6 Clicks" Daily Activity     Outcome Measure Help from another person eating meals?: None Help from another person taking care of personal grooming?: A Little Help from another person toileting, which includes using toliet, bedpan, or urinal?: A Little Help from another person bathing (including washing, rinsing, drying)?: A Little Help from another person to put on and taking off regular upper body clothing?: A Little Help from another person to put on and taking off regular lower body clothing?: A Little 6 Click Score: 19   End of Session Nurse Communication: Mobility status  Activity Tolerance: Patient limited by fatigue Patient left: Other (comment);with call bell/phone within reach (EOB)  OT Visit Diagnosis: Unsteadiness on feet (R26.81);Muscle weakness (generalized) (M62.81)                Time: 1027-2536 OT Time Calculation (min): 37 min Charges:  OT General Charges $OT Visit: 1 Visit OT Evaluation $OT Eval Low Complexity: 1 Low OT Treatments $Self Care/Home Management : 8-22 mins  Anderson Malta, OT Acute Rehab Services Office: [phone] 08/15/2020  Julien Girt 08/15/2020, 1:57 PM

## 2020-08-15 NOTE — Progress Notes (Signed)
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  Recent admission to hospital with COPD exacerbation.  Also admitted to dyspnea recently. In the emergency room 80% on room air and started on 6 L/min.  Otherwise normal.  COVID-19 negative.  Chest x-ray with mild hyperinflation.  Treated with COPD exacerbation protocol and admitted.   Assessment & Plan:   Principal Problem:   Acute respiratory failure with hypoxia (HCC) Active Problems:   Hypothyroidism   Chronic mesenteric ischemia (HCC)   Chronic respiratory failure with hypoxia (HCC)   COPD exacerbation (HCC)   Acute systolic CHF (congestive heart failure) (HCC)   Esophageal dysmotility  COPD with acute exacerbation/acute on chronic hypoxic respiratory failure due to COPD exacerbation: Agree with monitoring in the hospital and treatment given severity of symptoms.   Aggressive bronchodilator therapy, oral steroids, inhalational steroids, scheduled and as needed bronchodilators, deep breathing exercises, incentive spirometry, chest physiotherapy and respiratory therapy consult. Antibiotics due to severity of symptoms.  Will treat with 7 days of doxycycline. Supplemental oxygen to keep saturations more than 90%. Mobility and therapy consult.  Suspected heart failure exacerbation: Less likely.  Most of the symptoms related to COPD exacerbation.  IV Lasix was restarted last night.  Will discontinue and monitor.  Coronary artery disease: Currently without evidence of exacerbation.  On aspirin Plavix.  Chronic mesenteric ischemia: On Plavix and Pletal.   Continue.  Esophageal dysmotility with GERD: Recent esophagram was consistent with esophageal dysmotility.  Continue PPI and H2 blocker.  Hypothyroidism: On Synthroid.  Continue.   DVT prophylaxis: enoxaparin (LOVENOX) injection 40 mg Start: 08/14/20 1800   Code Status: DNR Family Communication: None Disposition Plan: Status is: Inpatient  Remains inpatient appropriate because:Inpatient level of care appropriate due to severity of illness   Dispo: The patient is from: Home              Anticipated d/c is to: Home              Anticipated d/c date is: 3 days              Patient currently is not medically stable to d/c.         Consultants:   None  Procedures:   None  Antimicrobials:   Doxycycline, 11/1---   Subjective: Patient seen and examined.  Overnight with wheezing and shortness of breath.  Stayed in the ER stretcher all night.  Still continues to have significant wheezing and difficulty breathing.   Objective: Vitals:   08/15/20 0716 08/15/20 0808 08/15/20 0850 08/15/20 0900  BP: (!) 142/104 122/68 (!) 153/77   Pulse: 84 76 88   Resp: 17 17 (!) 22   Temp:      TempSrc:      SpO2: 97% 100% 98% 97%   No intake or output data in the 24 hours ending 08/15/20 1036 There were no vitals filed for this visit.  Examination:  General exam: Appears chronically sick, mild distress with audible wheezing. Respiratory system: Both inspiratory and expiratory wheezing.  Conducted airway sounds and basal coarse crackles.  In mild respiratory stress. SpO2: 97 % O2 Flow Rate (L/min):  6 L/min  Cardiovascular system: S1 & S2 heard, RRR. No JVD, murmurs, rubs, gallops or clicks. No pedal edema. Gastrointestinal system: Abdomen is nondistended, soft and nontender. No organomegaly or masses felt. Normal bowel sounds heard. Central nervous system: Alert and oriented. No focal neurological deficits. Extremities: Symmetric 5 x 5 power. Skin: No rashes, lesions or  ulcers Psychiatry: Judgement and insight appear normal. Mood & affect anxious.    Data Reviewed: I have personally reviewed following labs and imaging studies  CBC: Recent Labs  Lab 08/14/20 1057 08/14/20 1221 08/15/20 0645  WBC 6.9  --  6.2  NEUTROABS 4.9  --   --   HGB 13.7 15.0 14.9  HCT 42.0 44.0 45.8  MCV 93.8  --  92.5  PLT 410*  --  496   Basic Metabolic Panel: Recent Labs  Lab 08/14/20 1057 08/14/20 1221 08/15/20 0645  NA 134* 137 133*  K 7.4* 5.1 4.2  CL 103 103 99  CO2 24  --  22  GLUCOSE 104* 107* 78  BUN 11 12 19   CREATININE 1.11* 0.80 0.94  CALCIUM 8.8*  --  7.9*   GFR: CrCl cannot be calculated (Unknown ideal weight.). Liver Function Tests: Recent Labs  Lab 08/14/20 1057  AST 93*  ALT 59*  ALKPHOS 81  BILITOT 1.9*  PROT 7.7  ALBUMIN 4.0   No results for input(s): LIPASE, AMYLASE in the last 168 hours. No results for input(s): AMMONIA in the last 168 hours. Coagulation Profile: No results for input(s): INR, PROTIME in the last 168 hours. Cardiac Enzymes: No results for input(s): CKTOTAL, CKMB, CKMBINDEX, TROPONINI in the last 168 hours. BNP (last 3 results) No results for input(s): PROBNP in the last 8760 hours. HbA1C: No results for input(s): HGBA1C in the last 72 hours. CBG: No results for input(s): GLUCAP in the last 168 hours. Lipid Profile: No results for input(s): CHOL, HDL, LDLCALC, TRIG, CHOLHDL, LDLDIRECT in the last 72 hours. Thyroid Function Tests: No results for input(s): TSH, T4TOTAL, FREET4, T3FREE, THYROIDAB in the last 72 hours. Anemia Panel: No results for input(s): VITAMINB12, FOLATE, FERRITIN, TIBC, IRON, RETICCTPCT in the last 72 hours. Sepsis Labs: No results for input(s): PROCALCITON, LATICACIDVEN in the last 168 hours.  Recent Results (from the past 240 hour(s))  Respiratory Panel by RT PCR (Flu A&B, Covid) - Nasopharyngeal Swab     Status: None   Collection Time: 08/14/20 11:16 AM   Specimen: Nasopharyngeal  Swab  Result Value Ref Range Status   SARS Coronavirus 2 by RT PCR NEGATIVE NEGATIVE Final    Comment: (NOTE) SARS-CoV-2 target nucleic acids are NOT DETECTED.  The SARS-CoV-2 RNA is generally detectable in upper respiratoy specimens during the acute phase of infection. The lowest concentration of SARS-CoV-2 viral copies this assay can detect is 131 copies/mL. A negative result does not preclude SARS-Cov-2 infection and should not be used as the sole basis for treatment or other patient management decisions. A negative result may occur with  improper specimen collection/handling, submission of specimen other than nasopharyngeal swab, presence of viral mutation(s) within the areas targeted by this assay, and inadequate number of viral copies (<131 copies/mL). A negative result must be combined with clinical observations, patient history, and epidemiological information. The expected result is Negative.  Fact Sheet for Patients:  PinkCheek.be  Fact Sheet for Healthcare Providers:  GravelBags.it  This test is no t yet approved or cleared by the [place] FDA and  has been authorized for detection and/or diagnosis  of SARS-CoV-2 by FDA under an Emergency Use Authorization (EUA). This EUA will remain  in effect (meaning this test can be used) for the duration of the COVID-19 declaration under Section 564(b)(1) of the Act, 21 U.S.C. section 360bbb-3(b)(1), unless the authorization is terminated or revoked sooner.     Influenza A by PCR NEGATIVE NEGATIVE Final   Influenza B by PCR NEGATIVE NEGATIVE Final    Comment: (NOTE) The Xpert Xpress SARS-CoV-2/FLU/RSV assay is intended as an aid in  the diagnosis of influenza from Nasopharyngeal swab specimens and  should not be used as a sole basis for treatment. Nasal washings and  aspirates are unacceptable for Xpert Xpress SARS-CoV-2/FLU/RSV  testing.  Fact Sheet for  Patients: PinkCheek.be  Fact Sheet for Healthcare Providers: GravelBags.it  This test is not yet approved or cleared by the [place] FDA and  has been authorized for detection and/or diagnosis of SARS-CoV-2 by  FDA under an Emergency Use Authorization (EUA). This EUA will remain  in effect (meaning this test can be used) for the duration of the  Covid-19 declaration under Section 564(b)(1) of the Act, 21  U.S.C. section 360bbb-3(b)(1), unless the authorization is  terminated or revoked. Performed at [facility], [place] [address] [place], [place] [address]          Radiology Studies: CT ANGIO CHEST PE W OR WO CONTRAST  Result Date: 08/14/2020 CLINICAL DATA:  Right breast cancer in remission. Chronic mesenteric ischemia. Left subclavian artery steal syndrome status post stent. Gradually worsening shortness of breath. EXAM: CT ANGIOGRAPHY CHEST WITH CONTRAST TECHNIQUE: Multidetector CT imaging of the chest was performed using the standard protocol during bolus administration of intravenous contrast. Multiplanar CT image reconstructions and MIPs were obtained to evaluate the vascular anatomy. CONTRAST:  146mL OMNIPAQUE IOHEXOL 350 MG/ML SOLN COMPARISON:  05/20/2020 FINDINGS: Cardiovascular: Evaluation is limited by respiratory motion artifact. Given this limitation, no acute pulmonary embolism was detected. The size of the main pulmonary artery is normal. The heart size is normal. There are coronary artery calcifications. There are atherosclerotic changes of the thoracic aorta without evidence for an aneurysm. There is a left subclavian artery stent in place, the patency of which cannot be well evaluated secondary to extensive streak artifact. Mediastinum/Nodes: -- No mediastinal lymphadenopathy. -- No hilar lymphadenopathy. -- No axillary lymphadenopathy. -- No supraclavicular lymphadenopathy. -- Normal thyroid  gland where visualized. -again identified is a moderate-sized hiatal hernia. Lungs/Pleura: There are moderate severe emphysematous changes. There is linear scarring versus atelectasis in the left upper lobe and right upper lobe. The patient is status post prior wedge resection on the right. Evaluation of the lung fields is limited by respiratory motion artifact. There is no pneumothorax. No pleural effusion. There is some bronchial wall thickening and mucus plugging at the lung bases. Upper Abdomen: Contrast bolus timing is not optimized for evaluation of the abdominal organs. There is apparent high-grade stenosis versus complete occlusion of the proximal celiac axis. 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 There are collaterals through the liver via the internal mammary and superior epigastric artery on the right. There is a stable lobular contour of the patient's spleen. Musculoskeletal: No chest wall abnormality. No bony spinal canal stenosis. Review of the MIP images confirms the above findings. IMPRESSION: 1. Evaluation is limited by respiratory motion artifact. Given this limitation, no acute pulmonary embolism was detected. 2. There is some bronchial wall thickening and mucus plugging at the lung bases, which can be seen in patients with  reactive or infectious bronchiolitis. 3. Moderate-sized hiatal hernia. Aortic Atherosclerosis (ICD10-I70.0) and Emphysema (ICD10-J43.9). Electronically Signed   By: Constance Holster M.D.   On: 08/14/2020 15:12   DG Chest Port 1 View  Result Date: 08/14/2020 CLINICAL DATA:  Shortness of breath.  COPD. EXAM: PORTABLE CHEST 1 VIEW COMPARISON:  None. FINDINGS: Postoperative changes in the right apex are identified. No pneumothorax. Mild cardiomegaly. The hila and mediastinum are unremarkable. Mild hyperinflation of the lungs. No other acute abnormalities. IMPRESSION: Postoperative changes in the right apex. No other acute abnormalities. Mild hyperinflation of the lungs  consistent with COPD/emphysema. Electronically Signed   By: Dorise Bullion III M.D   On: 08/14/2020 11:59        Scheduled Meds: . aspirin EC  81 mg Oral Daily  . atorvastatin  80 mg Oral Daily  . cilostazol  100 mg Oral BID  . clopidogrel  75 mg Oral Q breakfast  . doxycycline  100 mg Oral Q12H  . enoxaparin (LOVENOX) injection  40 mg Subcutaneous Q24H  . famotidine  40 mg Oral QHS  . fluticasone furoate-vilanterol  1 puff Inhalation Daily   And  . umeclidinium bromide  1 puff Inhalation Daily  . ipratropium-albuterol  3 mL Nebulization Q4H  . levothyroxine  112 mcg Oral QAC breakfast  . LORazepam  2 mg Oral QHS  . montelukast  10 mg Oral QHS  . pantoprazole  40 mg Oral Daily  . PARoxetine  20 mg Oral Daily  . predniSONE  40 mg Oral Q breakfast  . sodium chloride flush  3 mL Intravenous Q12H  . traZODone  100 mg Oral QHS   Continuous Infusions:   LOS: 1 day    Time spent: 40 minutes    Barb Merino, MD [facility] Pager [phone]

## 2020-08-16 ENCOUNTER — Ambulatory Visit: Payer: Medicare Other | Admitting: Primary Care

## 2020-08-16 MED ORDER — ALBUTEROL SULFATE (2.5 MG/3ML) 0.083% IN NEBU
2.5000 mg | INHALATION_SOLUTION | RESPIRATORY_TRACT | Status: DC
  Administered 2020-08-16 – 2020-08-17 (×8): 2.5 mg via RESPIRATORY_TRACT
  Filled 2020-08-16 (×7): qty 3

## 2020-08-16 MED ORDER — METHYLPREDNISOLONE SODIUM SUCC 125 MG IJ SOLR
60.0000 mg | Freq: Two times a day (BID) | INTRAMUSCULAR | Status: AC
  Administered 2020-08-16 – 2020-08-18 (×5): 60 mg via INTRAVENOUS
  Filled 2020-08-16 (×5): qty 2

## 2020-08-16 MED ORDER — FUROSEMIDE 40 MG PO TABS
40.0000 mg | ORAL_TABLET | Freq: Every day | ORAL | Status: DC
  Administered 2020-08-16 – 2020-08-18 (×3): 40 mg via ORAL
  Filled 2020-08-16 (×3): qty 1

## 2020-08-16 NOTE — Progress Notes (Signed)
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  Recent admission to hospital with COPD exacerbation.  Also admitted with dyspnea recently. In the emergency room 80% on room air and started on 6 L/min.  Otherwise normal.  COVID-19 negative.  Chest x-ray with mild hyperinflation.  Treated with COPD exacerbation protocol and admitted.   Assessment & Plan:   Principal Problem:   Acute respiratory failure with hypoxia (HCC) Active Problems:   Hypothyroidism   Chronic mesenteric ischemia (HCC)   Chronic respiratory failure with hypoxia (HCC)   COPD exacerbation (HCC)   Acute systolic CHF (congestive heart failure) (HCC)   Esophageal dysmotility  COPD with acute exacerbation/acute on chronic hypoxic respiratory failure due to COPD exacerbation.  Patient does have advanced COPD. Agree with monitoring in the hospital and treatment given severity of symptoms.   Aggressive bronchodilator therapy, IV steroids, inhalational steroids, scheduled and as needed bronchodilators, deep breathing exercises, incentive spirometry, chest physiotherapy and respiratory therapy consult. Antibiotics due to severity of symptoms.  Will treat with 7 days of doxycycline. Supplemental oxygen to keep saturations more than 90%. Mobility and therapy consult. Try mobilizing.  Cough medications.  Flutter valve therapy as much as she can do. Patient looks like she is very advanced stage COPD, will consult palliative care medicine.  Suspected heart failure exacerbation: Less likely.  Most of the symptoms related to COPD exacerbation.  She  did get some relief with IV Lasix.  She is not on maintenance diuretic at home.  Will keep on oral Lasix.  Coronary artery disease: Currently without evidence of exacerbation.  On aspirin Plavix.  Chronic mesenteric ischemia: On Plavix and Pletal.  Continue.  Esophageal dysmotility with GERD: Recent esophagram was consistent with esophageal dysmotility.  Continue PPI and H2 blocker.  Hypothyroidism: On Synthroid.  Continue.   DVT prophylaxis: enoxaparin (LOVENOX) injection 40 mg Start: 08/14/20 1800   Code Status: DNR Family Communication: Called patient's husband.  Unable to reach.  Will try later. Disposition Plan: Status is: Inpatient  Remains inpatient appropriate because:Inpatient level of care appropriate due to severity of illness   Dispo: The patient is from: Home              Anticipated d/c is to: Home with home health services.              Anticipated d/c date is: 3 days              Patient currently is not medically stable to d/c.         Consultants:   None  Procedures:   None  Antimicrobials:   Doxycycline, 11/1---   Subjective: Patient seen and examined.  Remains in very poor respiratory status with audible wheezing.  Unable to mobilize due to shortness of breath.  Intractable cough even on trying to do deep breathing exercises.  Remains afebrile.  Objective: Vitals:   08/16/20 0353 08/16/20 0605 08/16/20 0752 08/16/20 1007  BP:  139/72    Pulse:  87    Resp:  16    Temp:  98.1 F (36.7 C)    TempSrc:  Oral  SpO2: 94% 95% 97% (!) 78%  Weight:  60.3 kg      Intake/Output Summary (Last 24 hours) at 08/16/2020 1129 Last data filed at 08/16/2020 1034 Gross per 24 hour  Intake 250 ml  Output --  Net 250 ml   Filed Weights   08/16/20 0605  Weight: 60.3 kg    Examination:  General exam: Appears chronically sick, in moderate distress.  Intractable hacking cough on conversation. Respiratory system: Both inspiratory and expiratory  wheezing.  Conducted airway sounds and basal coarse crackles.  In mild respiratory stress.  Hypoxic on 4 L.  Increased to 6 L.SpO2: (!) 78 % O2 Flow Rate (L/min): (S) 4 L/min (Increased to 5 L )  Cardiovascular system: S1 & S2 heard, RRR.  Tachycardic.  No JVD, murmurs, rubs, gallops or clicks. No pedal edema. Gastrointestinal system: Abdomen is nondistended, soft and nontender. No organomegaly or masses felt. Normal bowel sounds heard. Central nervous system: Alert and oriented. No focal neurological deficits. Extremities: Symmetric 5 x 5 power. Skin: No rashes, lesions or ulcers Psychiatry: Judgement and insight appear normal. Mood & affect anxious.    Data Reviewed: I have personally reviewed following labs and imaging studies  CBC: Recent Labs  Lab 08/14/20 1057 08/14/20 1221 08/15/20 0645  WBC 6.9  --  6.2  NEUTROABS 4.9  --   --   HGB 13.7 15.0 14.9  HCT 42.0 44.0 45.8  MCV 93.8  --  92.5  PLT 410*  --  951   Basic Metabolic Panel: Recent Labs  Lab 08/14/20 1057 08/14/20 1221 08/15/20 0645  NA 134* 137 133*  K 7.4* 5.1 4.2  CL 103 103 99  CO2 24  --  22  GLUCOSE 104* 107* 78  BUN 11 12 19   CREATININE 1.11* 0.80 0.94  CALCIUM 8.8*  --  7.9*   GFR: Estimated Creatinine Clearance: 48.4 mL/min (by C-G formula based on SCr of 0.94 mg/dL). Liver Function Tests: Recent Labs  Lab 08/14/20 1057  AST 93*  ALT 59*  ALKPHOS 81  BILITOT 1.9*  PROT 7.7  ALBUMIN 4.0   No results for input(s): LIPASE, AMYLASE in the last 168 hours. No results for input(s): AMMONIA in the last 168 hours. Coagulation Profile: No results for input(s): INR, PROTIME in the last 168 hours. Cardiac Enzymes: No results for input(s): CKTOTAL, CKMB, CKMBINDEX, TROPONINI in the last 168 hours. BNP (last 3 results) No results for input(s): PROBNP in the last 8760 hours. HbA1C: No results for input(s): HGBA1C in the last 72 hours. CBG: No results for input(s): GLUCAP in the last 168  hours. Lipid Profile: No results for input(s): CHOL, HDL, LDLCALC, TRIG, CHOLHDL, LDLDIRECT in the last 72 hours. Thyroid Function Tests: No results for input(s): TSH, T4TOTAL, FREET4, T3FREE, THYROIDAB in the last 72 hours. Anemia Panel: No results for input(s): VITAMINB12, FOLATE, FERRITIN, TIBC, IRON, RETICCTPCT in the last 72 hours. Sepsis Labs: No results for input(s): PROCALCITON, LATICACIDVEN in the last 168 hours.  Recent Results (from the past 240 hour(s))  Respiratory Panel by RT PCR (Flu A&B, Covid) - Nasopharyngeal Swab     Status: None   Collection Time: 08/14/20 11:16 AM   Specimen: Nasopharyngeal Swab  Result Value Ref Range Status   SARS Coronavirus 2 by RT PCR NEGATIVE NEGATIVE Final    Comment: (NOTE) SARS-CoV-2 target nucleic acids are NOT DETECTED.  The SARS-CoV-2 RNA is generally detectable in upper respiratoy specimens during the acute phase of infection. The lowest concentration  of SARS-CoV-2 viral copies this assay can detect is 131 copies/mL. A negative result does not preclude SARS-Cov-2 infection and should not be used as the sole basis for treatment or other patient management decisions. A negative result may occur with  improper specimen collection/handling, submission of specimen other than nasopharyngeal swab, presence of viral mutation(s) within the areas targeted by this assay, and inadequate number of viral copies (<131 copies/mL). A negative result must be combined with clinical observations, patient history, and epidemiological information. The expected result is Negative.  Fact Sheet for Patients:  PinkCheek.be  Fact Sheet for Healthcare Providers:  GravelBags.it  This test is no t yet approved or cleared by the [place] FDA and  has been authorized for detection and/or diagnosis of SARS-CoV-2 by FDA under an Emergency Use Authorization (EUA). This EUA will remain  in effect  (meaning this test can be used) for the duration of the COVID-19 declaration under Section 564(b)(1) of the Act, 21 U.S.C. section 360bbb-3(b)(1), unless the authorization is terminated or revoked sooner.     Influenza A by PCR NEGATIVE NEGATIVE Final   Influenza B by PCR NEGATIVE NEGATIVE Final    Comment: (NOTE) The Xpert Xpress SARS-CoV-2/FLU/RSV assay is intended as an aid in  the diagnosis of influenza from Nasopharyngeal swab specimens and  should not be used as a sole basis for treatment. Nasal washings and  aspirates are unacceptable for Xpert Xpress SARS-CoV-2/FLU/RSV  testing.  Fact Sheet for Patients: PinkCheek.be  Fact Sheet for Healthcare Providers: GravelBags.it  This test is not yet approved or cleared by the [place] FDA and  has been authorized for detection and/or diagnosis of SARS-CoV-2 by  FDA under an Emergency Use Authorization (EUA). This EUA will remain  in effect (meaning this test can be used) for the duration of the  Covid-19 declaration under Section 564(b)(1) of the Act, 21  U.S.C. section 360bbb-3(b)(1), unless the authorization is  terminated or revoked. Performed at [facility], [place] [address] [place],  [address]          Radiology Studies: CT ANGIO CHEST PE W OR WO CONTRAST  Result Date: 08/14/2020 CLINICAL DATA:  Right breast cancer in remission. Chronic mesenteric ischemia. Left subclavian artery steal syndrome status post stent. Gradually worsening shortness of breath. EXAM: CT ANGIOGRAPHY CHEST WITH CONTRAST TECHNIQUE: Multidetector CT imaging of the chest was performed using the standard protocol during bolus administration of intravenous contrast. Multiplanar CT image reconstructions and MIPs were obtained to evaluate the vascular anatomy. CONTRAST:  110mL OMNIPAQUE IOHEXOL 350 MG/ML SOLN COMPARISON:  05/20/2020 FINDINGS: Cardiovascular: Evaluation  is limited by respiratory motion artifact. Given this limitation, no acute pulmonary embolism was detected. The size of the main pulmonary artery is normal. The heart size is normal. There are coronary artery calcifications. There are atherosclerotic changes of the thoracic aorta without evidence for an aneurysm. There is a left subclavian artery stent in place, the patency of which cannot be well evaluated secondary to extensive streak artifact. Mediastinum/Nodes: -- No mediastinal lymphadenopathy. -- No hilar lymphadenopathy. -- No axillary lymphadenopathy. -- No supraclavicular lymphadenopathy. -- Normal thyroid gland where visualized. -again identified is a moderate-sized hiatal hernia. Lungs/Pleura: There are moderate severe emphysematous changes. There is linear scarring versus atelectasis in the left upper lobe and right upper lobe. The patient is status post prior wedge resection on the right. Evaluation of the lung fields is limited by respiratory motion artifact. There is no pneumothorax. No pleural effusion. There is  some bronchial wall thickening and mucus plugging at the lung bases. Upper Abdomen: Contrast bolus timing is not optimized for evaluation of the abdominal organs. There is apparent high-grade stenosis versus complete occlusion of the proximal celiac axis. 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 There are collaterals through the liver via the internal mammary and superior epigastric artery on the right. There is a stable lobular contour of the patient's spleen. Musculoskeletal: No chest wall abnormality. No bony spinal canal stenosis. Review of the MIP images confirms the above findings. IMPRESSION: 1. Evaluation is limited by respiratory motion artifact. Given this limitation, no acute pulmonary embolism was detected. 2. There is some bronchial wall thickening and mucus plugging at the lung bases, which can be seen in patients with reactive or infectious bronchiolitis. 3. Moderate-sized hiatal  hernia. Aortic Atherosclerosis (ICD10-I70.0) and Emphysema (ICD10-J43.9). Electronically Signed   By: Constance Holster M.D.   On: 08/14/2020 15:12   DG Chest Port 1 View  Result Date: 08/14/2020 CLINICAL DATA:  Shortness of breath.  COPD. EXAM: PORTABLE CHEST 1 VIEW COMPARISON:  None. FINDINGS: Postoperative changes in the right apex are identified. No pneumothorax. Mild cardiomegaly. The hila and mediastinum are unremarkable. Mild hyperinflation of the lungs. No other acute abnormalities. IMPRESSION: Postoperative changes in the right apex. No other acute abnormalities. Mild hyperinflation of the lungs consistent with COPD/emphysema. Electronically Signed   By: Dorise Bullion III M.D   On: 08/14/2020 11:59        Scheduled Meds: . albuterol  2.5 mg Nebulization Q4H  . aspirin EC  81 mg Oral Daily  . atorvastatin  80 mg Oral Daily  . cilostazol  100 mg Oral BID  . clopidogrel  75 mg Oral Q breakfast  . doxycycline  100 mg Oral Q12H  . enoxaparin (LOVENOX) injection  40 mg Subcutaneous Q24H  . famotidine  40 mg Oral QHS  . fluticasone furoate-vilanterol  1 puff Inhalation Daily   And  . umeclidinium bromide  1 puff Inhalation Daily  . furosemide  40 mg Oral Daily  . levothyroxine  112 mcg Oral QAC breakfast  . LORazepam  2 mg Oral QHS  . methylPREDNISolone (SOLU-MEDROL) injection  60 mg Intravenous Q12H  . montelukast  10 mg Oral QHS  . pantoprazole  40 mg Oral Daily  . PARoxetine  20 mg Oral Daily  . sodium chloride flush  3 mL Intravenous Q12H  . traZODone  100 mg Oral QHS   Continuous Infusions:   LOS: 2 days    Time spent: 35 minutes    Barb Merino, MD [facility] Pager [phone]

## 2020-08-16 NOTE — Plan of Care (Signed)
Pt reports her breathing has improved today and she "feels much better than I did." Pt has been up to the chair this shift and goes to the [facility] independently. All questions addressed. RN will continue to monitor the rest of this shift.   Problem: Education: Goal: Knowledge of General Education information will improve Description: Including pain rating scale, medication(s)/side effects and non-pharmacologic comfort measures Outcome: Progressing   Problem: Health Behavior/Discharge Planning: Goal: Ability to manage health-related needs will improve Outcome: Progressing   Problem: Clinical Measurements: Goal: Ability to maintain clinical measurements within normal limits will improve Outcome: Progressing Goal: Will remain free from infection Outcome: Progressing Goal: Diagnostic test results will improve Outcome: Progressing Goal: Respiratory complications will improve Outcome: Progressing Goal: Cardiovascular complication will be avoided Outcome: Progressing   Problem: Activity: Goal: Risk for activity intolerance will decrease Outcome: Progressing   Problem: Nutrition: Goal: Adequate nutrition will be maintained Outcome: Progressing   Problem: Coping: Goal: Level of anxiety will decrease Outcome: Progressing   Problem: Elimination: Goal: Will not experience complications related to bowel motility Outcome: Progressing Goal: Will not experience complications related to urinary retention Outcome: Progressing   Problem: Pain Managment: Goal: General experience of comfort will improve Outcome: Progressing   Problem: Safety: Goal: Ability to remain free from injury will improve Outcome: Progressing   Problem: Skin Integrity: Goal: Risk for impaired skin integrity will decrease Outcome: Progressing

## 2020-08-16 NOTE — Progress Notes (Addendum)
Physical Therapy Treatment Patient Details Name: [name] MRN: [id number] DOB: May 10, 1946 Today's Date: 08/16/2020    History of Present Illness 74 year old Caucasian female with PMH COPD (on 2 L chronic nasal cannula at home), ongoing grief/mourning due to son passing away September 2020, hard of hearing, PVD, hypothyroidism, hypertension, hyperlipidemia, diverticulosis, CAD who presented to the hospital with worsening shortness of breath at home x~4 weeks.  Pt has had past hospitalization for dyspnea and shortness of breath with last in August 2021, has been on multiple rounds of steroids and antibiotics.  Pt admitted for COPD exacerbation.  Recent H/O L Heart Cath/Angiography 05-25-20    PT Comments    Pt's presentation today is very similar to yesterday. Pt exhibits dyspnea and increased work of breathing at rest. She was agreeable to working with PT as tolerated. O2 94% on 5L at rest. She performed 5 reps of sit to stands with O2 dropping to 85% on 6L [place] O2. Coughing spells noted with activity. Cues for pursed lip/deep breathing while resting. Pt is still not able to tolerate any ambulation on today. End of session: O2 92% on 5L [place] O2. Will continue to follow and progress activity as tolerated.     Follow Up Recommendations  Home health PT;Supervision for mobility/OOB     Equipment Recommendations  None recommended by PT    Recommendations for Other Services       Precautions / Restrictions Precautions Precautions: Fall Precaution Comments: O2 dep. Monitor O2 levels Restrictions Weight Bearing Restrictions: No    Mobility  Bed Mobility               General bed mobility comments: oob in recliner  Transfers Overall transfer level: Needs assistance Equipment used: None Transfers: Sit to/from Stand           General transfer comment: x 5 reps. O2 85% on 6L with activity. Cues for pursed lip/deep breathing. Dyspnea 3/4 with coughing  Ambulation/Gait              General Gait Details: NT-unable 2* current respiratory status.   Stairs             Wheelchair Mobility    Modified Rankin (Stroke Patients Only)       Balance Overall balance assessment: Needs assistance           Standing balance-Leahy Scale: Fair                              Cognition Arousal/Alertness: Awake/alert Behavior During Therapy: WFL for tasks assessed/performed Overall Cognitive Status: Within Functional Limits for tasks assessed                                        Exercises      General Comments        Pertinent Vitals/Pain Pain Assessment: No/denies pain    Home Living                      Prior Function            PT Goals (current goals can now be found in the care plan section) Progress towards PT goals: Progressing toward goals    Frequency    Min 3X/week      PT Plan Current plan remains appropriate    Co-evaluation  AM-PAC PT "6 Clicks" Mobility   Outcome Measure  Help needed turning from your back to your side while in a flat bed without using bedrails?: A Little Help needed moving from lying on your back to sitting on the side of a flat bed without using bedrails?: A Little Help needed moving to and from a bed to a chair (including a wheelchair)?: A Little Help needed standing up from a chair using your arms (e.g., wheelchair or bedside chair)?: A Little Help needed to walk in hospital room?: A Lot Help needed climbing 3-5 steps with a railing? : A Lot 6 Click Score: 16    End of Session Equipment Utilized During Treatment: Oxygen Activity Tolerance:  (limited by dyspnea/work of breathing) Patient left: in chair;with call bell/phone within reach   PT Visit Diagnosis: Difficulty in walking, not elsewhere classified (R26.2);Unsteadiness on feet (R26.81)     Time: 8889-1694 PT Time Calculation (min) (ACUTE ONLY): 9 min  Charges:  $Therapeutic  Exercise: 8-22 mins                        Doreatha Massed, PT Acute Rehabilitation  Office: [phone] Pager: [phone]

## 2020-08-17 MED ORDER — ALBUTEROL SULFATE (2.5 MG/3ML) 0.083% IN NEBU
2.5000 mg | INHALATION_SOLUTION | Freq: Four times a day (QID) | RESPIRATORY_TRACT | Status: DC
  Administered 2020-08-17 – 2020-08-21 (×16): 2.5 mg via RESPIRATORY_TRACT
  Filled 2020-08-17 (×18): qty 3

## 2020-08-17 MED ORDER — ENOXAPARIN SODIUM 40 MG/0.4ML ~~LOC~~ SOLN
40.0000 mg | SUBCUTANEOUS | Status: DC
  Administered 2020-08-18 – 2020-08-20 (×3): 40 mg via SUBCUTANEOUS
  Filled 2020-08-17 (×3): qty 0.4

## 2020-08-17 MED ORDER — ENOXAPARIN SODIUM 40 MG/0.4ML ~~LOC~~ SOLN: 40.0000 mg | SUBCUTANEOUS | Status: DC

## 2020-08-17 NOTE — Progress Notes (Signed)
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  She reported a 4-week progression of her symptoms initially requiring her to change to supplemental oxygen support 24 hours with slow upward titration of this dose required.  When the patient got to the point that she required 6 L/min at all times she presented to the ED.  She was last hospitalized for the same issue in August of this year.  Significant Events:  10/31 admit via ED  Antimicrobials:  Doxycycline 11/1 >  DVT prophylaxis: Lovenox  Subjective: Sats dropping to mid 80s w/ slight exertion.  Otherwise feels she is slowly improving.  Is significantly less short of breath when at rest.  Wheezing is audible upon entering room but patient does feel that she is improving.  Denies chest pain or abdominal pain.  Reports slowly improving appetite.  Assessment & Plan:  Advanced COPD with acute exacerbation -acute on chronic hypoxic respiratory failure Continue scheduled and as needed inhaled therapy, IV steroids, chest PT -antibiotics being dosed -titrate oxygen as able -mobilize -initiate goals of care discussion given advanced nature of her COPD  Diastolic CHF No evidence of an acute exacerbation presently -does not require maintenance diuretic  CAD Asymptomatic -continue usual aspirin and Plavix  Chronic mesenteric ischemia Continue usual Plavix and Pletal  Esophageal dysmotility with GERD Noted on recent esophagram -continue PPI and H2 blocker  Hypothyroidism Continue home Synthroid dose  Code Status: NO CODE BLUE Family  Communication:  Status is: Inpatient  Remains inpatient appropriate because:Inpatient level of care appropriate due to severity of illness   Dispo: The patient is from: Home              Anticipated d/c is to: Home              Anticipated d/c date is: 2 days              Patient currently is not medically stable to d/c.   Consultants:  none  Objective: Blood pressure (!) 125/57, pulse 83, temperature 97.6 F (36.4 C), temperature source Oral, resp. rate 16, weight 61.5 kg, SpO2 91 %.  Intake/Output Summary (Last 24 hours) at 08/17/2020 0752 Last data filed at 08/16/2020 1034 Gross per 24 hour  Intake 250 ml  Output --  Net 250 ml   Filed Weights   08/16/20 0605 08/17/20 0530  Weight: 60.3 kg 61.5 kg    Examination: General: No acute respiratory distress Lungs: Prominent diffuse expiratory wheezing with good air movement in all fields and some scattered crackles Cardiovascular: Regular rate and rhythm without murmur gallop or rub normal S1 and S2 Abdomen: Nontender, nondistended, soft, bowel sounds positive, no rebound, no ascites, no appreciable mass Extremities: No significant cyanosis, clubbing, or edema bilateral lower extremities  CBC: Recent Labs  Lab 08/14/20 1057 08/14/20 1221 08/15/20 0645  WBC 6.9  --  6.2  NEUTROABS 4.9  --   --   HGB 13.7 15.0 14.9  HCT 42.0 44.0 45.8  MCV 93.8  --  92.5  PLT 410*  --  154   Basic Metabolic Panel: Recent Labs  Lab 08/14/20 1057  08/14/20 1221 08/15/20 0645  NA 134* 137 133*  K 7.4* 5.1 4.2  CL 103 103 99  CO2 24  --  22  GLUCOSE 104* 107* 78  BUN 11 12 19   CREATININE 1.11* 0.80 0.94  CALCIUM 8.8*  --  7.9*   GFR: Estimated Creatinine Clearance: 48.4 mL/min (by C-G formula based on SCr of 0.94 mg/dL).  Liver Function Tests: Recent Labs  Lab 08/14/20 1057  AST 93*  ALT 59*  ALKPHOS 81  BILITOT 1.9*  PROT 7.7  ALBUMIN 4.0    HbA1C: Hgb A1c MFr Bld  Date/Time Value Ref Range Status  01/03/2018  04:01 PM 5.7 (H) <5.7 % of total Hgb Final    Comment:    For someone without known diabetes, a hemoglobin  A1c value between 5.7% and 6.4% is consistent with prediabetes and should be confirmed with a  follow-up test. . For someone with known diabetes, a value <7% indicates that their diabetes is well controlled. A1c targets should be individualized based on duration of diabetes, age, comorbid conditions, and other considerations. . This assay result is consistent with an increased risk of diabetes. . Currently, no consensus exists regarding use of hemoglobin A1c for diagnosis of diabetes for children. .     CBG: No results for input(s): GLUCAP in the last 168 hours.  Recent Results (from the past 240 hour(s))  Respiratory Panel by RT PCR (Flu A&B, Covid) - Nasopharyngeal Swab     Status: None   Collection Time: 08/14/20 11:16 AM   Specimen: Nasopharyngeal Swab  Result Value Ref Range Status   SARS Coronavirus 2 by RT PCR NEGATIVE NEGATIVE Final    Comment: (NOTE) SARS-CoV-2 target nucleic acids are NOT DETECTED.  The SARS-CoV-2 RNA is generally detectable in upper respiratoy specimens during the acute phase of infection. The lowest concentration of SARS-CoV-2 viral copies this assay can detect is 131 copies/mL. A negative result does not preclude SARS-Cov-2 infection and should not be used as the sole basis for treatment or other patient management decisions. A negative result may occur with  improper specimen collection/handling, submission of specimen other than nasopharyngeal swab, presence of viral mutation(s) within the areas targeted by this assay, and inadequate number of viral copies (<131 copies/mL). A negative result must be combined with clinical observations, patient history, and epidemiological information. The expected result is Negative.  Fact Sheet for Patients:  PinkCheek.be  Fact Sheet for Healthcare Providers:   GravelBags.it  This test is no t yet approved or cleared by the [place] FDA and  has been authorized for detection and/or diagnosis of SARS-CoV-2 by FDA under an Emergency Use Authorization (EUA). This EUA will remain  in effect (meaning this test can be used) for the duration of the COVID-19 declaration under Section 564(b)(1) of the Act, 21 U.S.C. section 360bbb-3(b)(1), unless the authorization is terminated or revoked sooner.     Influenza A by PCR NEGATIVE NEGATIVE Final   Influenza B by PCR NEGATIVE NEGATIVE Final    Comment: (NOTE) The Xpert Xpress SARS-CoV-2/FLU/RSV assay is intended as an aid in  the diagnosis of influenza from Nasopharyngeal swab specimens and  should not be used as a sole basis for treatment. Nasal washings and  aspirates are unacceptable for Xpert Xpress SARS-CoV-2/FLU/RSV  testing.  Fact Sheet for Patients: PinkCheek.be  Fact Sheet for Healthcare Providers: GravelBags.it  This test is not yet approved or cleared by the [place] FDA and  has been authorized for detection and/or  diagnosis of SARS-CoV-2 by  FDA under an Emergency Use Authorization (EUA). This EUA will remain  in effect (meaning this test can be used) for the duration of the  Covid-19 declaration under Section 564(b)(1) of the Act, 21  U.S.C. section 360bbb-3(b)(1), unless the authorization is  terminated or revoked. Performed at [facility], [place] [address] [place], [place] [address]      Scheduled Meds: . albuterol  2.5 mg Nebulization Q4H  . aspirin EC  81 mg Oral Daily  . atorvastatin  80 mg Oral Daily  . cilostazol  100 mg Oral BID  . clopidogrel  75 mg Oral Q breakfast  . doxycycline  100 mg Oral Q12H  . enoxaparin (LOVENOX) injection  40 mg Subcutaneous Q24H  . famotidine  40 mg Oral QHS  . fluticasone furoate-vilanterol  1 puff Inhalation Daily   And   . umeclidinium bromide  1 puff Inhalation Daily  . furosemide  40 mg Oral Daily  . levothyroxine  112 mcg Oral QAC breakfast  . LORazepam  2 mg Oral QHS  . methylPREDNISolone (SOLU-MEDROL) injection  60 mg Intravenous Q12H  . montelukast  10 mg Oral QHS  . pantoprazole  40 mg Oral Daily  . PARoxetine  20 mg Oral Daily  . sodium chloride flush  3 mL Intravenous Q12H  . traZODone  100 mg Oral QHS     LOS: 3 days   Cherene Altes, MD [facility] Office  [phone] Pager - Text Page per Amion  If 7PM-7AM, please contact night-coverage per Amion 08/17/2020, 7:52 AM

## 2020-08-17 NOTE — Progress Notes (Signed)
Physical Therapy Treatment Patient Details Name: [name] MRN: [id number] DOB: July 02, 1946 Today's Date: 08/17/2020    History of Present Illness 74 year old Caucasian female with PMH COPD (on 2 L chronic nasal cannula at home), ongoing grief/mourning due to son passing away September 2020, hard of hearing, PVD, hypothyroidism, hypertension, hyperlipidemia, diverticulosis, CAD who presented to the hospital with worsening shortness of breath at home x~4 weeks.  Pt has had past hospitalization for dyspnea and shortness of breath with last in August 2021, has been on multiple rounds of steroids and antibiotics.  Pt admitted for COPD exacerbation.  Recent H/O L Heart Cath/Angiography 05-25-20    PT Comments    Pt was agreeable to attempting ambulation on today. With activity, she has significant coughing spells, dyspnea, and increased work of breathing. Pt is able to mobilize with little to no assistance but she is limited by her current respiratory status. O2 92% on 5L at rest, 87% on 6L with ambulation. Will continue to follow and progress activity as tolerated.     Follow Up Recommendations  Home health PT;Supervision for mobility/OOB     Equipment Recommendations  None recommended by PT    Recommendations for Other Services       Precautions / Restrictions Precautions Precautions: Fall Precaution Comments: O2 dep. Monitor O2 levels Restrictions Weight Bearing Restrictions: No    Mobility  Bed Mobility               General bed mobility comments: sitting EOB  Transfers Overall transfer level: Needs assistance Equipment used: 4-wheeled walker Transfers: Sit to/from Stand Sit to Stand: Supervision         General transfer comment: Cues for operation of rollator only.  Ambulation/Gait Ambulation/Gait assistance: Min guard Gait Distance (Feet): 25 Feet (25'x1, 20'x1) Assistive device: 4-wheeled walker Gait Pattern/deviations: Step-through pattern;Decreased stride  length     General Gait Details: Pt walked ~25 feet before needing to sit and rest. Dyspnea 3/4 with significant coughing spell. O2 87% on 6L HFNC. Seated rest break before walking another 20 feet. Again with significant coughing spell. Pt unable to tolerate any more ambulaiton so used rollator to transport back to room.   Stairs             Wheelchair Mobility    Modified Rankin (Stroke Patients Only)       Balance Overall balance assessment: Needs assistance           Standing balance-Leahy Scale: Fair                              Cognition Arousal/Alertness: Awake/alert Behavior During Therapy: WFL for tasks assessed/performed Overall Cognitive Status: Within Functional Limits for tasks assessed                                        Exercises      General Comments        Pertinent Vitals/Pain Pain Assessment: No/denies pain    Home Living                      Prior Function            PT Goals (current goals can now be found in the care plan section) Progress towards PT goals: Progressing toward goals    Frequency    Min  3X/week      PT Plan Current plan remains appropriate    Co-evaluation              AM-PAC PT "6 Clicks" Mobility   Outcome Measure  Help needed turning from your back to your side while in a flat bed without using bedrails?: None Help needed moving from lying on your back to sitting on the side of a flat bed without using bedrails?: None Help needed moving to and from a bed to a chair (including a wheelchair)?: None Help needed standing up from a chair using your arms (e.g., wheelchair or bedside chair)?: None Help needed to walk in hospital room?: A Little Help needed climbing 3-5 steps with a railing? : A Little 6 Click Score: 22    End of Session Equipment Utilized During Treatment: Oxygen Activity Tolerance: Patient limited by fatigue (limited by coughing,  dyspnea) Patient left: with call bell/phone within reach (sitting EOB)   PT Visit Diagnosis: Difficulty in walking, not elsewhere classified (R26.2);Unsteadiness on feet (R26.81)     Time: 1020-1041 PT Time Calculation (min) (ACUTE ONLY): 21 min  Charges:  $Gait Training: 8-22 mins                        Doreatha Massed, PT Acute Rehabilitation  Office: [phone] Pager: [phone]

## 2020-08-17 NOTE — Progress Notes (Signed)
Discussed case with Dr. Thereasa Solo.   Will hold on palliative consult at this point.  Please call if we can be of further assistance in the care of [name] moving forward.  Christy Rough, MD [facility] [phone]  NO CHARGE NOTE

## 2020-08-18 LAB — CBC
HCT: 41.1 % (ref 36.0–46.0)
Hemoglobin: 13.2 g/dL (ref 12.0–15.0)
MCH: 29.9 pg (ref 26.0–34.0)
MCHC: 32.1 g/dL (ref 30.0–36.0)
MCV: 93.2 fL (ref 80.0–100.0)
Platelets: 343 10*3/uL (ref 150–400)
RBC: 4.41 MIL/uL (ref 3.87–5.11)
RDW: 13.5 % (ref 11.5–15.5)
WBC: 5 10*3/uL (ref 4.0–10.5)
nRBC: 0 % (ref 0.0–0.2)

## 2020-08-18 LAB — COMPREHENSIVE METABOLIC PANEL
ALT: 50 U/L — ABNORMAL HIGH (ref 0–44)
AST: 46 U/L — ABNORMAL HIGH (ref 15–41)
Albumin: 4 g/dL (ref 3.5–5.0)
Alkaline Phosphatase: 68 U/L (ref 38–126)
Anion gap: 9 (ref 5–15)
BUN: 19 mg/dL (ref 8–23)
CO2: 29 mmol/L (ref 22–32)
Calcium: 8.8 mg/dL — ABNORMAL LOW (ref 8.9–10.3)
Chloride: 95 mmol/L — ABNORMAL LOW (ref 98–111)
Creatinine, Ser: 0.75 mg/dL (ref 0.44–1.00)
GFR, Estimated: >60 mL/min (ref >60)
Glucose, Bld: 112 mg/dL — ABNORMAL HIGH (ref 70–99)
Potassium: 4.3 mmol/L (ref 3.5–5.1)
Sodium: 133 mmol/L — ABNORMAL LOW (ref 135–145)
Total Bilirubin: 0.7 mg/dL (ref 0.3–1.2)
Total Protein: 7.1 g/dL (ref 6.5–8.1)

## 2020-08-18 LAB — MAGNESIUM: Magnesium: 2.3 mg/dL (ref 1.7–2.4)

## 2020-08-18 MED ORDER — LIP MEDEX EX OINT
TOPICAL_OINTMENT | CUTANEOUS | Status: DC | PRN
  Filled 2020-08-18: qty 7

## 2020-08-18 MED ORDER — LOPERAMIDE HCL 2 MG PO CAPS
2.0000 mg | ORAL_CAPSULE | ORAL | Status: DC | PRN
  Administered 2020-08-18: 2 mg via ORAL
  Filled 2020-08-18: qty 1

## 2020-08-18 MED ORDER — PREDNISONE 50 MG PO TABS
60.0000 mg | ORAL_TABLET | Freq: Every day | ORAL | Status: DC
  Administered 2020-08-19 – 2020-08-21 (×3): 60 mg via ORAL
  Filled 2020-08-18 (×3): qty 1

## 2020-08-18 MED ORDER — FUROSEMIDE 40 MG PO TABS
40.0000 mg | ORAL_TABLET | Freq: Two times a day (BID) | ORAL | Status: DC
  Administered 2020-08-18 – 2020-08-21 (×6): 40 mg via ORAL
  Filled 2020-08-18 (×6): qty 1

## 2020-08-18 NOTE — Progress Notes (Signed)
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  She reported a 4-week progression of her symptoms initially requiring her to change to supplemental oxygen support 24 hours with slow upward titration of this dose required.  When the patient got to the point that she required 6 L/min at all times she presented to the ED.  She was last hospitalized for the same issue in August of this year.  Significant Events:  10/31 admit via ED  Antimicrobials:  Doxycycline 11/1 >  DVT prophylaxis: Lovenox  Subjective: VSS and pt afebrile.  Saturations 92% on 4L [place] when at rest, 87% on 6L with ambulation.  Feels that she is slowly getting better each day.  No chest pain nausea or vomiting.  Still feels dyspneic with exertion.  Assessment & Plan:  Advanced COPD with acute exacerbation - acute on chronic hypoxic respiratory failure Continue scheduled and as needed inhaled therapy, steroids - antibiotics being dosed -titrate oxygen as able -mobilize - making progress - hopeful for d/c home 77/8  Mild Systolic CHF TTE 2/42 noted EF 45-50% - did not previously require maintenance diuretic at home, but has been initiated this admission and will cont at time of d/c   Filed Weights   08/16/20 0605 08/17/20 0530 08/17/20 0900  Weight: 60.3 kg 61.5 kg 61.5 kg    Mild hyponatremia  Follow w/ ongoing diuretic use  Mild transaminitis  LFTs essentially normalized at this time   CAD Asymptomatic - continue usual aspirin and Plavix  Chronic mesenteric ischemia Continue usual Plavix and Pletal  Esophageal  dysmotility with GERD Noted on recent esophagram -continue PPI and H2 blocker  Hypothyroidism Continue home Synthroid dose  Code Status: NO CODE BLUE Family Communication:  Status is: Inpatient  Remains inpatient appropriate because:Inpatient level of care appropriate due to severity of illness   Dispo: The patient is from: Home              Anticipated d/c is to: Home              Anticipated d/c date is: 1 day              Patient currently is not medically stable to d/c.   Consultants:  none  Objective: Blood pressure 132/69, pulse 76, temperature 98.2 F (36.8 C), temperature source Oral, resp. rate 19, height 5\' 6"  (1.676 m), weight 61.5 kg, SpO2 92 %. No intake or output data in the 24 hours ending 08/18/20 0724 Filed Weights   08/16/20 0605 08/17/20 0530 08/17/20 0900  Weight: 60.3 kg 61.5 kg 61.5 kg    Examination: General: No acute respiratory distress Lungs: less prominent diffuse exp wheezing - mild bibasilar crackles  Cardiovascular: Regular rate and rhythm without murmur  Abdomen: Nontender, nondistended, soft, bowel sounds positive, no rebound Extremities: No significant edema bilateral lower extremities  CBC: Recent Labs  Lab 08/14/20 1057 08/14/20 1057 08/14/20 1221 08/15/20 0645 08/18/20 0421  WBC 6.9  --   --  6.2 5.0  NEUTROABS 4.9  --   --   --   --   HGB 13.7   < >  15.0 14.9 13.2  HCT 42.0   < > 44.0 45.8 41.1  MCV 93.8  --   --  92.5 93.2  PLT 410*  --   --  381 343   < > = values in this interval not displayed.   Basic Metabolic Panel: Recent Labs  Lab 08/14/20 1057 08/14/20 1057 08/14/20 1221 08/15/20 0645 08/18/20 0421  NA 134*   < > 137 133* 133*  K 7.4*   < > 5.1 4.2 4.3  CL 103   < > 103 99 95*  CO2 24  --   --  22 29  GLUCOSE 104*   < > 107* 78 112*  BUN 11   < > 12 19 19   CREATININE 1.11*   < > 0.80 0.94 0.75  CALCIUM 8.8*  --   --  7.9* 8.8*  MG  --   --   --   --  2.3   < > = values in this interval not displayed.    GFR: Estimated Creatinine Clearance: 56.9 mL/min (by C-G formula based on SCr of 0.75 mg/dL).  Liver Function Tests: Recent Labs  Lab 08/14/20 1057 08/18/20 0421  AST 93* 46*  ALT 59* 50*  ALKPHOS 81 68  BILITOT 1.9* 0.7  PROT 7.7 7.1  ALBUMIN 4.0 4.0    HbA1C: Hgb A1c MFr Bld  Date/Time Value Ref Range Status  01/03/2018 04:01 PM 5.7 (H) <5.7 % of total Hgb Final    Comment:    For someone without known diabetes, a hemoglobin  A1c value between 5.7% and 6.4% is consistent with prediabetes and should be confirmed with a  follow-up test. . For someone with known diabetes, a value <7% indicates that their diabetes is well controlled. A1c targets should be individualized based on duration of diabetes, age, comorbid conditions, and other considerations. . This assay result is consistent with an increased risk of diabetes. . Currently, no consensus exists regarding use of hemoglobin A1c for diagnosis of diabetes for children. .     CBG: No results for input(s): GLUCAP in the last 168 hours.  Recent Results (from the past 240 hour(s))  Respiratory Panel by RT PCR (Flu A&B, Covid) - Nasopharyngeal Swab     Status: None   Collection Time: 08/14/20 11:16 AM   Specimen: Nasopharyngeal Swab  Result Value Ref Range Status   SARS Coronavirus 2 by RT PCR NEGATIVE NEGATIVE Final    Comment: (NOTE) SARS-CoV-2 target nucleic acids are NOT DETECTED.  The SARS-CoV-2 RNA is generally detectable in upper respiratoy specimens during the acute phase of infection. The lowest concentration of SARS-CoV-2 viral copies this assay can detect is 131 copies/mL. A negative result does not preclude SARS-Cov-2 infection and should not be used as the sole basis for treatment or other patient management decisions. A negative result may occur with  improper specimen collection/handling, submission of specimen other than nasopharyngeal swab, presence of viral mutation(s) within the areas  targeted by this assay, and inadequate number of viral copies (<131 copies/mL). A negative result must be combined with clinical observations, patient history, and epidemiological information. The expected result is Negative.  Fact Sheet for Patients:  PinkCheek.be  Fact Sheet for Healthcare Providers:  GravelBags.it  This test is no t yet approved or cleared by the [place] FDA and  has been authorized for detection and/or diagnosis of SARS-CoV-2 by FDA under an Emergency Use Authorization (EUA). This EUA will remain  in effect (meaning this test  can be used) for the duration of the COVID-19 declaration under Section 564(b)(1) of the Act, 21 U.S.C. section 360bbb-3(b)(1), unless the authorization is terminated or revoked sooner.     Influenza A by PCR NEGATIVE NEGATIVE Final   Influenza B by PCR NEGATIVE NEGATIVE Final    Comment: (NOTE) The Xpert Xpress SARS-CoV-2/FLU/RSV assay is intended as an aid in  the diagnosis of influenza from Nasopharyngeal swab specimens and  should not be used as a sole basis for treatment. Nasal washings and  aspirates are unacceptable for Xpert Xpress SARS-CoV-2/FLU/RSV  testing.  Fact Sheet for Patients: PinkCheek.be  Fact Sheet for Healthcare Providers: GravelBags.it  This test is not yet approved or cleared by the [place] FDA and  has been authorized for detection and/or diagnosis of SARS-CoV-2 by  FDA under an Emergency Use Authorization (EUA). This EUA will remain  in effect (meaning this test can be used) for the duration of the  Covid-19 declaration under Section 564(b)(1) of the Act, 21  U.S.C. section 360bbb-3(b)(1), unless the authorization is  terminated or revoked. Performed at [facility], [place] [address], [place], [place] [address]      Scheduled Meds: . albuterol  2.5 mg  Nebulization Q6H  . aspirin EC  81 mg Oral Daily  . atorvastatin  80 mg Oral Daily  . cilostazol  100 mg Oral BID  . clopidogrel  75 mg Oral Q breakfast  . doxycycline  100 mg Oral Q12H  . enoxaparin (LOVENOX) injection  40 mg Subcutaneous Q24H  . famotidine  40 mg Oral QHS  . fluticasone furoate-vilanterol  1 puff Inhalation Daily   And  . umeclidinium bromide  1 puff Inhalation Daily  . furosemide  40 mg Oral Daily  . levothyroxine  112 mcg Oral QAC breakfast  . LORazepam  2 mg Oral QHS  . methylPREDNISolone (SOLU-MEDROL) injection  60 mg Intravenous Q12H  . montelukast  10 mg Oral QHS  . pantoprazole  40 mg Oral Daily  . PARoxetine  20 mg Oral Daily  . sodium chloride flush  3 mL Intravenous Q12H  . traZODone  100 mg Oral QHS     LOS: 4 days   Cherene Altes, MD [facility] Office  [phone] Pager - Text Page per Amion  If 7PM-7AM, please contact night-coverage per Amion 08/18/2020, 7:24 AM

## 2020-08-18 NOTE — Progress Notes (Signed)
Occupational Therapy Treatment Patient Details Name: [name] MRN: [id number] DOB: 1945/12/09 Today's Date: 08/18/2020    History of present illness 74 year old Caucasian female with PMH COPD (on 2 L chronic nasal cannula at home), ongoing grief/mourning due to son passing away September 2020, hard of hearing, PVD, hypothyroidism, hypertension, hyperlipidemia, diverticulosis, CAD who presented to the hospital with worsening shortness of breath at home x~4 weeks.  Pt has had past hospitalization for dyspnea and shortness of breath with last in August 2021, has been on multiple rounds of steroids and antibiotics.  Pt admitted for COPD exacerbation.  Recent H/O L Heart Cath/Angiography 05-25-20   OT comments  Patient continues to be limited by poor activity tolerance, coughing and oxygen desaturation. Patient found at side of bed at 88% on 4L. Ambulated 24 feet around the bed on 6L. O2 sat dropped to 82%. Required significant time to recover due to constant coughing spells dropping o2 sat. Patient returned to right side of bed by transferring over the bed. o2  Sat dropped to 74% and required 8L briefly to recover back to 90% and patient returned to 4L. Cont POC.  Follow Up Recommendations  Home health OT    Equipment Recommendations  3 in 1 bedside commode;Other (comment)    Recommendations for Other Services      Precautions / Restrictions Precautions Precautions: Other (comment) Precaution Comments: O2 dep. Monitor O2 levels Restrictions Weight Bearing Restrictions: No       Mobility Bed Mobility Overal bed mobility: Modified Independent             General bed mobility comments: Patient found sitting EOB. Patient able to manuever in and out of bed without assistance during treatment.  Transfers Overall transfer level: Needs assistance Equipment used: None Transfers: Sit to/from Stand Sit to Stand: Supervision Stand pivot transfers: Min assist       General transfer  comment: Min assist for hand hold to ambulate around room on 6 L. Oxygen dropped to 82% with ambulation of 24 feet in room and brought on prolonged coughing spell at side of bed. Required significant time to recover to 86% due to coughing. Patient returned to right side of bed via manuevering over bed which resulted in even more coughing. Patient's o2  sat dropped briefly to 74% and required upto 8L HFNC to recover to 90%. Once recovered dropped down to 4L.    Balance Overall balance assessment: Mild deficits observed, not formally tested                                         ADL either performed or assessed with clinical judgement   ADL                                               Vision Patient Visual Report: No change from baseline     Perception     Praxis      Cognition Arousal/Alertness: Awake/alert Behavior During Therapy: WFL for tasks assessed/performed Overall Cognitive Status: Within Functional Limits for tasks assessed  Exercises     Shoulder Instructions       General Comments      Pertinent Vitals/ Pain       Pain Assessment: No/denies pain  Home Living                                          Prior Functioning/Environment              Frequency  Min 2X/week        Progress Toward Goals  OT Goals(current goals can now be found in the care plan section)  Progress towards OT goals: Progressing toward goals  Acute Rehab OT Goals Patient Stated Goal: Be able to take a shower and perform toileting hygiene without becoming overly fatigued and short of breath. OT Goal Formulation: With patient Time For Goal Achievement: 08/29/20 Potential to Achieve Goals: Good  Plan Discharge plan remains appropriate    Co-evaluation                 AM-PAC OT "6 Clicks" Daily Activity     Outcome Measure   Help from another person  eating meals?: None Help from another person taking care of personal grooming?: A Little Help from another person toileting, which includes using toliet, bedpan, or urinal?: A Little Help from another person bathing (including washing, rinsing, drying)?: A Little Help from another person to put on and taking off regular upper body clothing?: A Little Help from another person to put on and taking off regular lower body clothing?: A Little 6 Click Score: 19    End of Session Equipment Utilized During Treatment: Oxygen  OT Visit Diagnosis: Unsteadiness on feet (R26.81);Muscle weakness (generalized) (M62.81)   Activity Tolerance Patient limited by fatigue (limited by o2 sat and coughing)   Patient Left     Nurse Communication Mobility status (o2 sats, request for cough medicine.)        Time: 1440-1503 OT Time Calculation (min): 23 min  Charges: OT General Charges $OT Visit: 1 Visit OT Treatments $Therapeutic Activity: 23-37 mins  Loring Liskey, OTR/L [place]  Office [phone] Pager: [place] 08/18/2020, 5:00 PM

## 2020-08-19 ENCOUNTER — Encounter (HOSPITAL_COMMUNITY): Payer: Medicare Other

## 2020-08-19 LAB — BASIC METABOLIC PANEL
Anion gap: 12 (ref 5–15)
BUN: 23 mg/dL (ref 8–23)
CO2: 32 mmol/L (ref 22–32)
Calcium: 8.8 mg/dL — ABNORMAL LOW (ref 8.9–10.3)
Chloride: 92 mmol/L — ABNORMAL LOW (ref 98–111)
Creatinine, Ser: 0.84 mg/dL (ref 0.44–1.00)
GFR, Estimated: >60 mL/min (ref >60)
Glucose, Bld: 86 mg/dL (ref 70–99)
Potassium: 3.3 mmol/L — ABNORMAL LOW (ref 3.5–5.1)
Sodium: 136 mmol/L (ref 135–145)

## 2020-08-19 LAB — MAGNESIUM: Magnesium: 1.9 mg/dL (ref 1.7–2.4)

## 2020-08-19 MED ORDER — POTASSIUM CHLORIDE CRYS ER 20 MEQ PO TBCR
40.0000 meq | EXTENDED_RELEASE_TABLET | Freq: Two times a day (BID) | ORAL | Status: DC
  Administered 2020-08-19 – 2020-08-21 (×5): 40 meq via ORAL
  Filled 2020-08-19 (×5): qty 2

## 2020-08-19 NOTE — Progress Notes (Signed)
Physical Therapy Treatment Patient Details Name: [name] MRN: [id number] DOB: December 04, 1945 Today's Date: 08/19/2020    History of Present Illness 74 year old Caucasian female with PMH COPD (on 2 L chronic nasal cannula at home), ongoing grief/mourning due to son passing away September 2020, hard of hearing, PVD, hypothyroidism, hypertension, hyperlipidemia, diverticulosis, CAD who presented to the hospital with worsening shortness of breath at home x~4 weeks.  Pt has had past hospitalization for dyspnea and shortness of breath with last in August 2021, has been on multiple rounds of steroids and antibiotics.  Pt admitted for COPD exacerbation.  Recent H/O L Heart Cath/Angiography 05-25-20    PT Comments    Pt making gradual improvement.  She was able to ambulate 44' but again limited due to DOE and coughing episodes.  Pt tolerated 4 LPM O2 at rest and 6 LPM with activity with stable O2 sats.  Do recommend rollator at d/c for energy conservation and rest breaks.    Follow Up Recommendations  Home health PT;Supervision for mobility/OOB     Equipment Recommendations  Other (comment) (rollator for energy conservation and rest breaks)    Recommendations for Other Services       Precautions / Restrictions Precautions Precautions: Other (comment) Precaution Comments: O2 dep. Monitor O2 levels    Mobility  Bed Mobility Overal bed mobility: Independent                Transfers Overall transfer level: Needs assistance Equipment used: Rolling walker (2 wheeled) Transfers: Sit to/from Stand Sit to Stand: Supervision            Ambulation/Gait Ambulation/Gait assistance: Min guard Gait Distance (Feet): 50 Feet Assistive device: Rolling walker (2 wheeled) Gait Pattern/deviations: Step-through pattern;Decreased stride length Gait velocity: decreased   General Gait Details: Ambulated 25' but then began coughing and had to return to room 25' more feet.  Took several mins to  recover from coughing  and then fatigued.  See below for vitals.  Discussed rollator vs RW with pt for energy conservation at home.  Pt preferred rollator due to maneuvarability and ability to take rest breaks.   Stairs             Wheelchair Mobility    Modified Rankin (Stroke Patients Only)       Balance Overall balance assessment: Needs assistance   Sitting balance-Leahy Scale: Normal     Standing balance support: No upper extremity supported Standing balance-Leahy Scale: Fair Standing balance comment: able to stand without UE support but RW for ambulation                            Cognition Arousal/Alertness: Awake/alert Behavior During Therapy: WFL for tasks assessed/performed Overall Cognitive Status: Within Functional Limits for tasks assessed                                        Exercises      General Comments General comments (skin integrity, edema, etc.): Pt was on 4 LPM O2 with sats 90% rest, increased to 6 LPM with ambulation and even with coughing episode sats maintained 90%, decreased back to 4 LPM once recovered and sats 92%      Pertinent Vitals/Pain Pain Assessment: No/denies pain    Home Living  Prior Function            PT Goals (current goals can now be found in the care plan section) Acute Rehab PT Goals Patient Stated Goal: Be able to take a shower and perform toileting hygiene without becoming overly fatigued and short of breath. PT Goal Formulation: With patient/family Time For Goal Achievement: 08/29/20 Potential to Achieve Goals: Good Progress towards PT goals: Progressing toward goals    Frequency    Min 3X/week      PT Plan Equipment recommendations need to be updated    Co-evaluation              AM-PAC PT "6 Clicks" Mobility   Outcome Measure  Help needed turning from your back to your side while in a flat bed without using bedrails?: None Help  needed moving from lying on your back to sitting on the side of a flat bed without using bedrails?: None Help needed moving to and from a bed to a chair (including a wheelchair)?: None Help needed standing up from a chair using your arms (e.g., wheelchair or bedside chair)?: None Help needed to walk in hospital room?: A Little Help needed climbing 3-5 steps with a railing? : A Little 6 Click Score: 22    End of Session Equipment Utilized During Treatment: Oxygen Activity Tolerance: Patient limited by fatigue Patient left: with call bell/phone within reach;in bed;Other (comment) (sitting EOB) Nurse Communication: Mobility status PT Visit Diagnosis: Difficulty in walking, not elsewhere classified (R26.2);Unsteadiness on feet (R26.81)     Time: 1500-1520 PT Time Calculation (min) (ACUTE ONLY): 20 min  Charges:  $Gait Training: 8-22 mins                     Abran Richard, PT Acute Rehab Services Pager [phone] [facility] [place] 08/19/2020, 3:26 PM

## 2020-08-19 NOTE — Progress Notes (Signed)
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  She reported a 4-week progression of her symptoms initially requiring her to change to supplemental oxygen support 24 hours with slow upward titration of this dose required.  When the patient got to the point that she required 6 L/min at all times she presented to the ED.  She was last hospitalized for the same issue in August of this year.  Significant Events:  10/31 admit via ED  Antimicrobials:  Doxycycline 11/1 > 11/5  DVT prophylaxis: Lovenox  Subjective: Vital signs stable.  Oxygen saturations 98% on 4 L but desaturates profoundly with exertion requiring 6+ liters of support at that time.  No new complaints today.  Is quite anxious.  Is discouraged by desaturation with exertion.  Denies chest pain nausea vomiting or abdominal pain.  Assessment & Plan:  Advanced COPD with acute exacerbation - acute on chronic hypoxic respiratory failure Continue scheduled and as needed inhaled therapy, steroids - antibiotics being dosed -titrate oxygen as able -mobilize - making progress slowly   Mild Systolic CHF TTE 6/38 noted EF 45-50% - did not previously require maintenance diuretic at home, but has been initiated this admission and will cont at time of d/c - now on lasix BID  Filed Weights   08/16/20 0605 08/17/20 0530 08/17/20 0900  Weight: 60.3 kg 61.5 kg 61.5 kg    Mild hyponatremia  Corrected w/ ongoing diuretic use  Mild hypokalemia Due to use of diuretic -supplement and follow -magnesium normal  Mild transaminitis  LFTs have essentially  normalized    CAD Asymptomatic - continue usual aspirin and Plavix  Chronic mesenteric ischemia Continue usual Plavix and Pletal  Esophageal dysmotility with GERD Noted on recent esophagram -continue PPI and H2 blocker  Hypothyroidism Continue home Synthroid dose  Code Status: NO CODE BLUE Family Communication:  Status is: Inpatient  Remains inpatient appropriate because:Inpatient level of care appropriate due to severity of illness   Dispo: The patient is from: Home              Anticipated d/c is to: Home              Anticipated d/c date is: 1 day              Patient currently is not medically stable to d/c.   Consultants:  none  Objective: Blood pressure 129/83, pulse 72, temperature 98.2 F (36.8 C), temperature source Oral, resp. rate 18, height 5\' 6"  (1.676 m), weight 61.5 kg, SpO2 98 %.  Intake/Output Summary (Last 24 hours) at 08/19/2020 0753 Last data filed at 08/18/2020 1918 Gross per 24 hour  Intake 1083 ml  Output --  Net 1083 ml   Filed Weights   08/16/20 0605 08/17/20 0530 08/17/20 0900  Weight: 60.3 kg 61.5 kg 61.5 kg    Examination: General: No acute respiratory distress -anxious Lungs: Mild expiratory wheezing with fine basilar crackles Cardiovascular: RRR Abdomen: Nontender, nondistended, soft, bowel sounds positive, no rebound Extremities: No significant edema B LE  CBC: Recent Labs  Lab 08/14/20 1057 08/14/20 1057 08/14/20 1221 08/15/20 0645 08/18/20 0421  WBC  6.9  --   --  6.2 5.0  NEUTROABS 4.9  --   --   --   --   HGB 13.7   < > 15.0 14.9 13.2  HCT 42.0   < > 44.0 45.8 41.1  MCV 93.8  --   --  92.5 93.2  PLT 410*  --   --  381 343   < > = values in this interval not displayed.   Basic Metabolic Panel: Recent Labs  Lab 08/15/20 0645 08/18/20 0421 08/19/20 0428  NA 133* 133* 136  K 4.2 4.3 3.3*  CL 99 95* 92*  CO2 22 29 32  GLUCOSE 78 112* 86  BUN 19 19 23   CREATININE 0.94 0.75 0.84  CALCIUM 7.9* 8.8* 8.8*  MG  --   2.3 1.9   GFR: Estimated Creatinine Clearance: 54.2 mL/min (by C-G formula based on SCr of 0.84 mg/dL).  Liver Function Tests: Recent Labs  Lab 08/14/20 1057 08/18/20 0421  AST 93* 46*  ALT 59* 50*  ALKPHOS 81 68  BILITOT 1.9* 0.7  PROT 7.7 7.1  ALBUMIN 4.0 4.0    HbA1C: Hgb A1c MFr Bld  Date/Time Value Ref Range Status  01/03/2018 04:01 PM 5.7 (H) <5.7 % of total Hgb Final    Comment:    For someone without known diabetes, a hemoglobin  A1c value between 5.7% and 6.4% is consistent with prediabetes and should be confirmed with a  follow-up test. . For someone with known diabetes, a value <7% indicates that their diabetes is well controlled. A1c targets should be individualized based on duration of diabetes, age, comorbid conditions, and other considerations. . This assay result is consistent with an increased risk of diabetes. . Currently, no consensus exists regarding use of hemoglobin A1c for diagnosis of diabetes for children. .     CBG: No results for input(s): GLUCAP in the last 168 hours.  Recent Results (from the past 240 hour(s))  Respiratory Panel by RT PCR (Flu A&B, Covid) - Nasopharyngeal Swab     Status: None   Collection Time: 08/14/20 11:16 AM   Specimen: Nasopharyngeal Swab  Result Value Ref Range Status   SARS Coronavirus 2 by RT PCR NEGATIVE NEGATIVE Final    Comment: (NOTE) SARS-CoV-2 target nucleic acids are NOT DETECTED.  The SARS-CoV-2 RNA is generally detectable in upper respiratoy specimens during the acute phase of infection. The lowest concentration of SARS-CoV-2 viral copies this assay can detect is 131 copies/mL. A negative result does not preclude SARS-Cov-2 infection and should not be used as the sole basis for treatment or other patient management decisions. A negative result may occur with  improper specimen collection/handling, submission of specimen other than nasopharyngeal swab, presence of viral mutation(s) within  the areas targeted by this assay, and inadequate number of viral copies (<131 copies/mL). A negative result must be combined with clinical observations, patient history, and epidemiological information. The expected result is Negative.  Fact Sheet for Patients:  PinkCheek.be  Fact Sheet for Healthcare Providers:  GravelBags.it  This test is no t yet approved or cleared by the [place] FDA and  has been authorized for detection and/or diagnosis of SARS-CoV-2 by FDA under an Emergency Use Authorization (EUA). This EUA will remain  in effect (meaning this test can be used) for the duration of the COVID-19 declaration under Section 564(b)(1) of the Act, 21 U.S.C. section 360bbb-3(b)(1), unless the authorization is terminated or revoked sooner.     Influenza A  by PCR NEGATIVE NEGATIVE Final   Influenza B by PCR NEGATIVE NEGATIVE Final    Comment: (NOTE) The Xpert Xpress SARS-CoV-2/FLU/RSV assay is intended as an aid in  the diagnosis of influenza from Nasopharyngeal swab specimens and  should not be used as a sole basis for treatment. Nasal washings and  aspirates are unacceptable for Xpert Xpress SARS-CoV-2/FLU/RSV  testing.  Fact Sheet for Patients: PinkCheek.be  Fact Sheet for Healthcare Providers: GravelBags.it  This test is not yet approved or cleared by the [place] FDA and  has been authorized for detection and/or diagnosis of SARS-CoV-2 by  FDA under an Emergency Use Authorization (EUA). This EUA will remain  in effect (meaning this test can be used) for the duration of the  Covid-19 declaration under Section 564(b)(1) of the Act, 21  U.S.C. section 360bbb-3(b)(1), unless the authorization is  terminated or revoked. Performed at [facility], [place] [address] [place], [place] [address]      Scheduled Meds:  albuterol  2.5 mg  Nebulization Q6H   aspirin EC  81 mg Oral Daily   atorvastatin  80 mg Oral Daily   cilostazol  100 mg Oral BID   clopidogrel  75 mg Oral Q breakfast   doxycycline  100 mg Oral Q12H   enoxaparin (LOVENOX) injection  40 mg Subcutaneous Q24H   famotidine  40 mg Oral QHS   fluticasone furoate-vilanterol  1 puff Inhalation Daily   And   umeclidinium bromide  1 puff Inhalation Daily   furosemide  40 mg Oral BID   levothyroxine  112 mcg Oral QAC breakfast   LORazepam  2 mg Oral QHS   montelukast  10 mg Oral QHS   pantoprazole  40 mg Oral Daily   PARoxetine  20 mg Oral Daily   predniSONE  60 mg Oral Q breakfast   sodium chloride flush  3 mL Intravenous Q12H   traZODone  100 mg Oral QHS     LOS: 5 days   Cherene Altes, MD [facility] Office  [phone] Pager - Text Page per Shea Evans  If 7PM-7AM, please contact night-coverage per [place] 08/19/2020, 7:53 AM

## 2020-08-20 LAB — BASIC METABOLIC PANEL
Anion gap: 10 (ref 5–15)
BUN: 22 mg/dL (ref 8–23)
CO2: 34 mmol/L — ABNORMAL HIGH (ref 22–32)
Calcium: 8.8 mg/dL — ABNORMAL LOW (ref 8.9–10.3)
Chloride: 89 mmol/L — ABNORMAL LOW (ref 98–111)
Creatinine, Ser: 0.93 mg/dL (ref 0.44–1.00)
GFR, Estimated: >60 mL/min (ref >60)
Glucose, Bld: 93 mg/dL (ref 70–99)
Potassium: 4.1 mmol/L (ref 3.5–5.1)
Sodium: 133 mmol/L — ABNORMAL LOW (ref 135–145)

## 2020-08-20 LAB — MAGNESIUM: Magnesium: 1.8 mg/dL (ref 1.7–2.4)

## 2020-08-20 MED ORDER — MAGNESIUM SULFATE 2 GM/50ML IV SOLN
2.0000 g | Freq: Once | INTRAVENOUS | Status: AC
  Administered 2020-08-20: 2 g via INTRAVENOUS
  Filled 2020-08-20: qty 50

## 2020-08-20 NOTE — Progress Notes (Signed)
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  She reported a 4-week progression of her symptoms initially requiring her to change to supplemental oxygen support 24 hours with slow upward titration of this dose required.  When the patient got to the point that she required 6 L/min at all times she presented to the ED.  She was last hospitalized for the same issue in August of this year.  Significant Events:  10/31 admit via ED  Antimicrobials:  Doxycycline 11/1 > 11/5   DVT prophylaxis: Lovenox  Subjective: Able to tolerate activity with PT yesterday on 6 L nasal cannula support.  Stable on 4 L at rest.  Feels that her exertional tolerance is slowly improving.  Feeling more stable on her feet.  Oral intake improving.  Still some anxiety.  Denies chest pain nausea or vomiting.   Assessment & Plan:  Advanced COPD with acute exacerbation - acute on chronic hypoxic respiratory failure Continue scheduled and as needed inhaled therapy, steroids - antibiotics being dosed - titrate oxygen as able - mobilize - anticipate discharge home tomorrow on 6 L nasal cannula support  Mild Systolic CHF TTE 6/30 noted EF 45-50% - did not previously require maintenance diuretic at home, but has been initiated this admission and will cont at time of d/c - now on lasix BID -weight is stable -addition of diuretic has made a significant improvement in her respiratory status  Filed Weights   08/17/20 0530 08/17/20 0900 08/20/20 0621  Weight: 61.5 kg 61.5 kg 61.3 kg    Mild hyponatremia  Stable w/ ongoing diuretic  use  Mild hypokalemia Due to use of diuretic -corrected with supplementation  Mild hypomagnesemia Supplement today with goal 2.0  Mild transaminitis  LFTs have essentially normalized    CAD Asymptomatic - continue usual aspirin and Plavix  Chronic mesenteric ischemia Continue usual Plavix and Pletal  Esophageal dysmotility with GERD Noted on recent esophagram -continue PPI and H2 blocker  Hypothyroidism Continue home Synthroid dose  Code Status: NO CODE BLUE Family Communication:  Status is: Inpatient  Remains inpatient appropriate because:Inpatient level of care appropriate due to severity of illness   Dispo: The patient is from: Home              Anticipated d/c is to: Home              Anticipated d/c date is: 1 day              Patient currently is not medically stable to d/c.   Consultants:  none  Objective: Blood pressure 120/68, pulse 75, temperature 98.5 F (36.9 C), temperature source Oral, resp. rate 18, height 5\' 6"  (1.676 m), weight 61.3 kg, SpO2 92 %.  Intake/Output Summary (Last 24 hours) at 08/20/2020 0839 Last data filed at 08/20/2020 0616 Gross per 24 hour  Intake 2046 ml  Output 1600 ml  Net 446 ml   Filed Weights   08/17/20 0530 08/17/20 0900 08/20/20 1601  Weight: 61.5 kg 61.5 kg 61.3 kg    Examination: General: No acute respiratory distress Lungs: Mild expiratory wheezing w/ improved air movement th/o  Cardiovascular: RRR Abdomen: NT/ND, soft, bowel sounds positive, no rebound Extremities: No significant edema B LE  CBC: Recent Labs  Lab 08/14/20 1057 08/14/20 1057 08/14/20 1221 08/15/20 0645 08/18/20 0421  WBC 6.9  --   --  6.2 5.0  NEUTROABS 4.9  --   --   --   --   HGB 13.7   < > 15.0 14.9 13.2  HCT 42.0   < > 44.0 45.8 41.1  MCV 93.8  --   --  92.5 93.2  PLT 410*  --   --  381 343   < > = values in this interval not displayed.   Basic Metabolic Panel: Recent Labs  Lab 08/18/20 0421 08/19/20 0428 08/20/20 0545   NA 133* 136 133*  K 4.3 3.3* 4.1  CL 95* 92* 89*  CO2 29 32 34*  GLUCOSE 112* 86 93  BUN 19 23 22   CREATININE 0.75 0.84 0.93  CALCIUM 8.8* 8.8* 8.8*  MG 2.3 1.9 1.8   GFR: Estimated Creatinine Clearance: 48.9 mL/min (by C-G formula based on SCr of 0.93 mg/dL).  Liver Function Tests: Recent Labs  Lab 08/14/20 1057 08/18/20 0421  AST 93* 46*  ALT 59* 50*  ALKPHOS 81 68  BILITOT 1.9* 0.7  PROT 7.7 7.1  ALBUMIN 4.0 4.0    HbA1C: Hgb A1c MFr Bld  Date/Time Value Ref Range Status  01/03/2018 04:01 PM 5.7 (H) <5.7 % of total Hgb Final    Comment:    For someone without known diabetes, a hemoglobin  A1c value between 5.7% and 6.4% is consistent with prediabetes and should be confirmed with a  follow-up test. . For someone with known diabetes, a value <7% indicates that their diabetes is well controlled. A1c targets should be individualized based on duration of diabetes, age, comorbid conditions, and other considerations. . This assay result is consistent with an increased risk of diabetes. . Currently, no consensus exists regarding use of hemoglobin A1c for diagnosis of diabetes for children. .     CBG: No results for input(s): GLUCAP in the last 168 hours.  Recent Results (from the past 240 hour(s))  Respiratory Panel by RT PCR (Flu A&B, Covid) - Nasopharyngeal Swab     Status: None   Collection Time: 08/14/20 11:16 AM   Specimen: Nasopharyngeal Swab  Result Value Ref Range Status   SARS Coronavirus 2 by RT PCR NEGATIVE NEGATIVE Final    Comment: (NOTE) SARS-CoV-2 target nucleic acids are NOT DETECTED.  The SARS-CoV-2 RNA is generally detectable in upper respiratoy specimens during the acute phase of infection. The lowest concentration of SARS-CoV-2 viral copies this assay can detect is 131 copies/mL. A negative result does not preclude SARS-Cov-2 infection and should not be used as the sole basis for treatment or other patient management decisions. A  negative result may occur with  improper specimen collection/handling, submission of specimen other than nasopharyngeal swab, presence of viral mutation(s) within the areas targeted by this assay, and inadequate number of viral copies (<131 copies/mL). A negative result must be combined with clinical observations, patient history, and epidemiological information. The expected result is Negative.  Fact Sheet for Patients:  PinkCheek.be  Fact Sheet for Healthcare Providers:  GravelBags.it  This test is no t yet approved or cleared by the [place] FDA and  has been authorized for detection and/or diagnosis of SARS-CoV-2 by FDA under an Emergency Use Authorization (EUA). This EUA will remain  in effect (meaning this test can be  used) for the duration of the COVID-19 declaration under Section 564(b)(1) of the Act, 21 U.S.C. section 360bbb-3(b)(1), unless the authorization is terminated or revoked sooner.     Influenza A by PCR NEGATIVE NEGATIVE Final   Influenza B by PCR NEGATIVE NEGATIVE Final    Comment: (NOTE) The Xpert Xpress SARS-CoV-2/FLU/RSV assay is intended as an aid in  the diagnosis of influenza from Nasopharyngeal swab specimens and  should not be used as a sole basis for treatment. Nasal washings and  aspirates are unacceptable for Xpert Xpress SARS-CoV-2/FLU/RSV  testing.  Fact Sheet for Patients: PinkCheek.be  Fact Sheet for Healthcare Providers: GravelBags.it  This test is not yet approved or cleared by the [place] FDA and  has been authorized for detection and/or diagnosis of SARS-CoV-2 by  FDA under an Emergency Use Authorization (EUA). This EUA will remain  in effect (meaning this test can be used) for the duration of the  Covid-19 declaration under Section 564(b)(1) of the Act, 21  U.S.C. section 360bbb-3(b)(1), unless the authorization  is  terminated or revoked. Performed at [facility], [place] [address], [place], [place] [address]      Scheduled Meds: . albuterol  2.5 mg Nebulization Q6H  . aspirin EC  81 mg Oral Daily  . atorvastatin  80 mg Oral Daily  . cilostazol  100 mg Oral BID  . clopidogrel  75 mg Oral Q breakfast  . doxycycline  100 mg Oral Q12H  . enoxaparin (LOVENOX) injection  40 mg Subcutaneous Q24H  . famotidine  40 mg Oral QHS  . fluticasone furoate-vilanterol  1 puff Inhalation Daily   And  . umeclidinium bromide  1 puff Inhalation Daily  . furosemide  40 mg Oral BID  . levothyroxine  112 mcg Oral QAC breakfast  . LORazepam  2 mg Oral QHS  . montelukast  10 mg Oral QHS  . pantoprazole  40 mg Oral Daily  . PARoxetine  20 mg Oral Daily  . potassium chloride  40 mEq Oral BID  . predniSONE  60 mg Oral Q breakfast  . sodium chloride flush  3 mL Intravenous Q12H  . traZODone  100 mg Oral QHS     LOS: 6 days   Cherene Altes, MD [facility] Office  [phone] Pager - Text Page per Shea Evans  If 7PM-7AM, please contact night-coverage per Amion 08/20/2020, 8:39 AM

## 2020-08-20 NOTE — Progress Notes (Signed)
SATURATION QUALIFICATIONS: (This note is used to comply with regulatory documentation for home oxygen)  Patient Saturations on Room Air at Rest 92%  Patient Saturations on Room Air while Ambulating 85%  Patient Saturations on 2 Liters of oxygen while Ambulating 87% Patient Saturations on 3.5 Liters of oxygen while Ambulating 91%  Please briefly explain why patient needs home oxygen: Pt is on home oxygen baseline 2 liters. Currently stable on 3.5 liters.  SRP,RN

## 2020-08-21 LAB — BASIC METABOLIC PANEL
Anion gap: 9 (ref 5–15)
BUN: 20 mg/dL (ref 8–23)
CO2: 33 mmol/L — ABNORMAL HIGH (ref 22–32)
Calcium: 8.8 mg/dL — ABNORMAL LOW (ref 8.9–10.3)
Chloride: 91 mmol/L — ABNORMAL LOW (ref 98–111)
Creatinine, Ser: 0.88 mg/dL (ref 0.44–1.00)
GFR, Estimated: >60 mL/min (ref >60)
Glucose, Bld: 115 mg/dL — ABNORMAL HIGH (ref 70–99)
Potassium: 3.8 mmol/L (ref 3.5–5.1)
Sodium: 133 mmol/L — ABNORMAL LOW (ref 135–145)

## 2020-08-21 LAB — MAGNESIUM: Magnesium: 1.9 mg/dL (ref 1.7–2.4)

## 2020-08-21 MED ORDER — ACETAMINOPHEN 325 MG PO TABS: 650.0000 mg | ORAL_TABLET | Freq: Four times a day (QID) | ORAL | Status: DC | PRN

## 2020-08-21 MED ORDER — POTASSIUM CHLORIDE CRYS ER 20 MEQ PO TBCR
40.0000 meq | EXTENDED_RELEASE_TABLET | Freq: Two times a day (BID) | ORAL | 1 refills | Status: DC
Start: 2020-08-21 — End: 2020-09-22

## 2020-08-21 MED ORDER — FUROSEMIDE 40 MG PO TABS
40.0000 mg | ORAL_TABLET | Freq: Two times a day (BID) | ORAL | 1 refills | Status: DC
Start: 2020-08-21 — End: 2020-10-10

## 2020-08-21 MED ORDER — PREDNISONE 20 MG PO TABS
ORAL_TABLET | ORAL | 0 refills | Status: DC
Start: 2020-08-21 — End: 2020-09-10

## 2020-08-21 MED ORDER — ASPIRIN EC 81 MG PO TBEC: 81.0000 mg | DELAYED_RELEASE_TABLET | Freq: Every day | ORAL | Status: AC

## 2020-08-21 NOTE — TOC Progression Note (Signed)
Transition of Care [facility]) - Progression Note    Patient Details  Name: [name] MRN: [id number] Date of Birth: 1946/10/11  Transition of Care [facility]) CM/SW Contact  Joaquin Courts, RN Phone Number: 08/21/2020, 1:02 PM  Clinical Narrative:   CM spoke with patient regarding increased oxygen needs, patient currently rents a portable concentrator from [employer], but does not have a stationary set up at home.  [employer] to provide portable O2 tank for transport home and a stationary set-up as well as DME 3-In-1.  [facility] for HHPT/OT services.     Expected Discharge Plan: [place] Barriers to Discharge: No Barriers Identified  Expected Discharge Plan and Services Expected Discharge Plan: [place]   Discharge Planning Services: CM Consult Post Acute Care Choice: [place] arrangements for the past 2 months: Single Family Home Expected Discharge Date: 08/21/20               DME Arranged: Oxygen, 3-N-1 DME Agency: [facility] Date DME Agency Contacted: 08/21/20 Time DME Agency Contacted: 61 Representative spoke with at DME Agency: [place]: PT, OT [place] Date [place]: 08/21/20 Time [place]: 1301 Representative spoke with at [place]: Amy   Social Determinants of Health ([place]) Interventions    Readmission Risk Interventions No flowsheet data found.

## 2020-08-21 NOTE — Progress Notes (Signed)
Patient discharging home with husband.  IV removed - WNL.  Reviewed AVS and medications with patient and instructed to follow up with PCP and pulmonology. Patient verbalizes understanding.  No questions at this time.  Patient in NAD at this time, will be assisted off unit via [facility] when all home equipment delivered.

## 2020-08-21 NOTE — Progress Notes (Signed)
SATURATION QUALIFICATIONS: (This note is used to comply with regulatory documentation for home oxygen)  Patient Saturations on Room Air at Rest = 86%  Patient Saturations on Room Air while Ambulating = 83%  Patient Saturations on 6 Liters of oxygen while Ambulating = 88%  Please briefly explain why patient needs home oxygen: patient desats to 83% while ambulating on RA O2 sat increased to 85% on 4L [place] while ambualting Required O2 to be increased again to 6L to get saturations to 88% while ambulating

## 2020-08-21 NOTE — Discharge Instructions (Signed)
Chronic Obstructive Pulmonary Disease Exacerbation  Chronic obstructive pulmonary disease (COPD) is a long-term (chronic) condition that affects the lungs. COPD is a general term that can be used to describe many different lung problems that cause lung swelling (inflammation) and limit airflow, including chronic bronchitis and emphysema. COPD exacerbations are episodes when breathing symptoms become much worse and require extra treatment. COPD exacerbations are usually caused by infections. Without treatment, COPD exacerbations can be severe and even life threatening. Frequent COPD exacerbations can cause further damage to the lungs. What are the causes? This condition may be caused by:  Respiratory infections, including viral and bacterial infections.  Exposure to smoke.  Exposure to air pollution, chemical fumes, or dust.  Things that give you an allergic reaction (allergens).  Not taking your usual COPD medicines as directed.  Underlying medical problems, such as congestive heart failure or infections not involving the lungs. In many cases, the cause (trigger) of this condition is not known. What increases the risk? The following factors may make you more likely to develop this condition:  Smoking cigarettes.  Old age.  Frequent prior COPD exacerbations. What are the signs or symptoms? Symptoms of this condition include:  Increased coughing.  Increased production of mucus from your lungs (sputum).  Increased wheezing.  Increased shortness of breath.  Rapid or labored breathing.  Chest tightness.  Less energy than usual.  Sleep disruption from symptoms.  Confusion or increased sleepiness. Often these symptoms happen or get worse even with the use of medicines. How is this diagnosed? This condition is diagnosed based on:  Your medical history.  A physical exam. You may also have tests, including:  A chest X-ray.  Blood tests.  Lung (pulmonary) function  tests. How is this treated? Treatment for this condition depends on the severity and cause of the symptoms. You may need to be admitted to a hospital for treatment. Some of the treatments commonly used to treat COPD exacerbations are:  Antibiotic medicines. These may be used for severe exacerbations caused by a lung infection, such as pneumonia.  Bronchodilators. These are inhaled medicines that expand the air passages and allow increased airflow.  Steroid medicines. These act to reduce inflammation in the airways. They may be given with an inhaler, taken by mouth, or given through an IV tube inserted into one of your veins.  Supplemental oxygen therapy.  Airway clearing techniques, such as noninvasive ventilation (NIV) and positive expiratory pressure (PEP). These provide respiratory support through a mask or other noninvasive device. An example of this would be using a continuous positive airway pressure (CPAP) machine to improve delivery of oxygen into your lungs. Follow these instructions at home: Medicines  Take over-the-counter and prescription medicines only as told by your health care provider. It is important to use correct technique with inhaled medicines.  If you were prescribed an antibiotic medicine or oral steroid, take it as told by your health care provider. Do not stop taking the medicine even if you start to feel better. Lifestyle  Eat a healthy diet.  Exercise regularly.  Get plenty of sleep.  Avoid exposure to all substances that irritate the airway, especially to tobacco smoke.  Wash your hands often with soap and water to reduce the risk of infection. If soap and water are not available, use hand sanitizer.  During flu season, avoid enclosed spaces that are crowded with people. General instructions  Drink enough fluid to keep your urine clear or pale yellow (unless you have a medical  condition that requires fluid restriction).  Use a cool mist vaporizer. This  humidifies the air and makes it easier for you to clear your chest when you cough.  If you have a home nebulizer and oxygen, continue to use them as told by your health care provider.  Keep all follow-up visits as told by your health care provider. This is important. How is this prevented?  Stay up-to-date on pneumococcal and influenza (flu) vaccines. A flu shot is recommended every year to help prevent exacerbations.  Do not use any products that contain nicotine or tobacco, such as cigarettes and e-cigarettes. Quitting smoking is very important in preventing COPD from getting worse and in preventing exacerbations from happening as often. If you need help quitting, ask your health care provider.  Follow all instructions for pulmonary rehabilitation after a recent exacerbation. This can help prevent future exacerbations.  Work with your health care provider to develop and follow an action plan. This tells you what steps to take when you experience certain symptoms. Contact a health care provider if:  You have a worsening of your regular COPD symptoms. Get help right away if:  You have worsening shortness of breath, even when resting.  You have trouble talking.  You have severe chest pain.  You cough up blood.  You have a fever.  You have weakness, vomit repeatedly, or faint.  You feel confused.  You are not able to sleep because of your symptoms.  You have trouble doing daily activities. Summary  COPD exacerbations are episodes when breathing symptoms become much worse and require extra treatment above your normal treatment.  Exacerbations can be severe and even life threatening. Frequent COPD exacerbations can cause further damage to your lungs.  COPD exacerbations are usually triggered by infections such as the flu, colds, and even pneumonia.  Treatment for this condition depends on the severity and cause of the symptoms. You may need to be admitted to a hospital for  treatment.  Quitting smoking is very important to prevent COPD from getting worse and to prevent exacerbations from happening as often. This information is not intended to replace advice given to you by your health care provider. Make sure you discuss any questions you have with your health care provider. Document Revised: 09/13/2017 Document Reviewed: 11/05/2016 Elsevier Patient Education  2020 [place].   Heart Failure, Diagnosis  Heart failure is a condition in which the heart has trouble pumping blood because it has become weak or stiff. This means that the heart does not pump blood well enough for the body to stay healthy. For some people with heart failure, fluid may back up into the lungs. There may also be swelling (edema) in the lower legs. Heart failure is usually a long-term (chronic) condition. It is important for you to take good care of yourself and follow the treatment plan from your health care provider. What are the causes? This condition may be caused by:  High blood pressure (hypertension). Hypertension causes the heart muscle to work harder than normal. This makes the heart stiff or weak.  Coronary artery disease, or CAD. CAD is the buildup of cholesterol and fat (plaque) in the arteries of the heart.  Heart attack, also called myocardial infarction. This injures the heart muscle, making it hard for the heart to pump blood.  Abnormal heart valves. The valves do not open and close properly, forcing the heart to pump harder to keep the blood flowing.  Heart muscle disease (cardiomyopathy or myocarditis).  This is damage to the heart muscle. It can increase the risk of heart failure.  Lung disease. The heart works harder when the lungs are not healthy.  Abnormal heart rhythms. These can lead to heart failure. What increases the risk? The risk of heart failure increases as a person ages. This condition is also more likely to develop in people who:  Are  overweight.  Are female.  Smoke or chew tobacco.  Abuse alcohol or illegal drugs.  Have taken medicines that can damage the heart, such as chemotherapy drugs.  Have diabetes.  Have abnormal heart rhythms.  Have thyroid problems.  Have low blood counts (anemia). What are the signs or symptoms? Symptoms of this condition include:  Shortness of breath with activity, such as when climbing stairs.  A cough that does not go away.  Swelling of the feet, ankles, legs, or abdomen.  Losing weight for no reason.  Trouble breathing when lying flat (orthopnea).  Waking from sleep because of the need to sit up and get more air.  Rapid heartbeat.  Tiredness (fatigue) and loss of energy.  Feeling light-headed, dizzy, or close to fainting.  Loss of appetite.  Nausea.  Waking up more often during the night to urinate (nocturia).  Confusion. How is this diagnosed? This condition is diagnosed based on:  Your medical history, symptoms, and a physical exam.  Diagnostic tests, which may include: ? Echocardiogram. ? Electrocardiogram (ECG). ? Chest X-ray. ? Blood tests. ? Exercise stress test. ? Radionuclide scans. ? Cardiac catheterization and angiogram. How is this treated? Treatment for this condition is aimed at managing the symptoms of heart failure. Medicines Treatment may include medicines that:  Help lower blood pressure by relaxing (dilating) the blood vessels. These medicines are called ACE inhibitors (angiotensin-converting enzyme) and ARBs (angiotensin receptor blockers).  Cause the kidneys to remove salt and water from the blood through urination (diuretics).  Improve heart muscle strength and prevent the heart from beating too fast (beta blockers).  Increase the force of the heartbeat (digoxin). Healthy behavior changes     Treatment may also include making healthy lifestyle changes, such as:  Reaching and staying at a healthy weight.  Quitting  smoking or chewing tobacco.  Eating heart-healthy foods.  Limiting or avoiding alcohol.  Stopping the use of illegal drugs.  Being physically active.  Other treatments Other treatments may include:  Procedures to open blocked arteries or repair damaged valves.  Placing a pacemaker to improve heart function (cardiac resynchronization therapy).  Placing a device to treat serious abnormal heart rhythms (implantable cardioverter defibrillator, or ICD).  Placing a device to improve the pumping ability of the heart (left ventricular assist device, or LVAD).  Receiving a healthy heart from a donor (heart transplant). This is done when other treatments have not helped. Follow these instructions at home:  Manage other health conditions as told by your health care provider. These may include hypertension, diabetes, thyroid disease, or abnormal heart rhythms.  Get ongoing education and support as needed. Learn as much as you can about heart failure.  Keep all follow-up visits as told by your health care provider. This is important. Summary  Heart failure is a condition in which the heart has trouble pumping blood because it has become weak or stiff.  This condition is caused by high blood pressure and other diseases of the heart and lungs.  Symptoms of this condition include shortness of breath, tiredness (fatigue), nausea, and swelling of the feet, ankles, legs, or  abdomen.  Treatments for this condition may include medicines, lifestyle changes, and surgery.  Manage other health conditions as told by your health care provider. This information is not intended to replace advice given to you by your health care provider. Make sure you discuss any questions you have with your health care provider. Document Revised: 12/19/2018 Document Reviewed: 12/19/2018 Elsevier Patient Education  [place].

## 2020-08-21 NOTE — Discharge Summary (Signed)
DISCHARGE SUMMARY  [name]  MR#: [id number]  DOB:1945/12/23  Date of Admission: 08/14/2020 Date of Discharge: 08/21/2020  Attending Physician:Irelyn Perfecto Hennie Duos, MD  Patient's WUJ:[id number], Christy So, MD  Consults: none  Disposition: D/C Home    Follow-up Appts:  Follow-up Information    Christy, [name], MD Follow up in 5 day(s).   Specialty: Family Medicine Contact information: [place] [address] [phone]        Brand Males, MD. Schedule an appointment as soon as possible for a visit in 7 day(s).   Specialty: Pulmonary Disease Contact information: [address] [place] [address] [phone]               Tests Needing Follow-up: -Assess saturations and consider if oxygen can be weaned downward -Assess volume status and determine if diuretic needs to be titrated -Check electrolytes with patient now on diuretic  Discharge Diagnoses: Advanced COPD with acute exacerbation Acute on chronic hypoxic respiratory failure Mild Systolic CHF w/ acute exacerbation  Mild hyponatremia  Mild hypokalemia Mild hypomagnesemia Mild transaminitis  CAD Chronic mesenteric ischemia Esophageal dysmotility with GERD Hypothyroidism  Initial presentation: 74yo with a history of COPD on supplemental oxygen at home, right breast cancer in remission, chronic mesenteric ischemia, CAD, diastolic CHF, 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  She reported a 4-week progression of her symptoms initially requiring her to change to supplemental oxygen support 24 hours with slow upward titration of this dose required.  When the patient got to the point that she required 6 L/min at all times she presented to the ED.  She was last hospitalized for the same issue in August of this  year.  Hospital Course:  Advanced COPD with acute exacerbation - acute on chronic hypoxic respiratory failure Has very slowly but consistently improved with inhaled bronchodilators, systemic steroids, and empiric antibiotic therapy -at time of discharge her respiratory effort is stable on 4 L while at rest and 6 L with exertion -she has done well with PT/OT and is cleared for discharge home   SATURATION QUALIFICATIONS:  Requires 4L [place] O2 support when resting to keep sats 88% or >   Requires 6L [place] O2 support when ambulating to keep sats 88% or >  Mild Systolic CHF with acute exacerbation  TTE 8/21 noted EF 45-50% - did not previously require maintenance diuretic at home, but has been initiated this admission and will cont at time of d/c - now on lasix BID -weight is stable -addition of diuretic has made a significant improvement in her respiratory status  Mild hyponatremia  Stable w/ ongoing diuretic use  Mild hypokalemia Due to use of diuretic -corrected with supplementation  Mild hypomagnesemia Due to diuretic and poor intake - corrected w/ supplementation   Mild transaminitis  LFTs have essentially normalized    CAD Asymptomatic - continue usual aspirin and Plavix  Chronic mesenteric ischemia Continue usual Plavix and Pletal  Esophageal dysmotility with GERD Noted on recent esophagram -continue PPI and H2 blocker  Hypothyroidism Continue home Synthroid dose  Allergies as of 08/21/2020      Reactions   Sulfa Antibiotics Swelling      Medication List    STOP taking these medications   OXYGEN     TAKE these medications   acetaminophen 325 MG tablet Commonly known as: TYLENOL  Take 2 tablets (650 mg total) by mouth every 6 (six) hours as needed for mild pain (or Fever >/= 101).   albuterol 108 (90 Base) MCG/ACT inhaler Commonly known as: VENTOLIN HFA Inhale 2 puffs into the lungs every 6 (six) hours as needed for wheezing or shortness of breath.   aspirin  EC 81 MG tablet Take 1 tablet (81 mg total) by mouth daily.   atorvastatin 80 MG tablet Commonly known as: LIPITOR TAKE 1 TABLET BY MOUTH EVERY DAY   benzonatate 100 MG capsule Commonly known as: Tessalon Perles Take 1 capsule (100 mg total) by mouth 3 (three) times daily as needed for cough.   Biotin 10000 MCG Tabs Take 10,000 mcg by mouth daily.   cilostazol 100 MG tablet Commonly known as: PLETAL Take 1 tablet (100 mg total) by mouth 2 (two) times daily.   clopidogrel 75 MG tablet Commonly known as: Plavix Take 1 tablet (75 mg total) by mouth daily.   dextromethorphan-guaiFENesin 30-600 MG 12hr tablet Commonly known as: MUCINEX DM Take 1 tablet by mouth 2 (two) times daily.   famotidine 40 MG tablet Commonly known as: PEPCID Take 1 tablet (40 mg total) by mouth at bedtime.   fluticasone 50 MCG/ACT nasal spray Commonly known as: FLONASE USE 1 SPRAY INTO EACH NOSTRIL TWICE A DAY What changed: See the new instructions.   furosemide 40 MG tablet Commonly known as: LASIX Take 1 tablet (40 mg total) by mouth 2 (two) times daily.   ipratropium-albuterol 0.5-2.5 (3) MG/3ML Soln Commonly known as: DUONEB Inhale 3 mLs into the lungs every 6 (six) hours as needed (sob, wheezing).   levothyroxine 112 MCG tablet Commonly known as: SYNTHROID TAKE 1 TABLET (112 MCG TOTAL) BY MOUTH DAILY BEFORE BREAKFAST.   LORazepam 2 MG tablet Commonly known as: ATIVAN Take 1 tablet (2 mg total) by mouth at bedtime.   montelukast 10 MG tablet Commonly known as: SINGULAIR Take 1 tablet (10 mg total) by mouth at bedtime.   pantoprazole 40 MG tablet Commonly known as: PROTONIX Take 1 tablet (40 mg total) by mouth daily.   PARoxetine 20 MG tablet Commonly known as: PAXIL TAKE 1 TABLET BY MOUTH EVERYDAY AT BEDTIME What changed: See the new instructions.   potassium chloride SA 20 MEQ tablet Commonly known as: KLOR-CON Take 2 tablets (40 mEq total) by mouth 2 (two) times daily.    predniSONE 20 MG tablet Commonly known as: DELTASONE Take 3 tablets a day on 11/8 and 11/9 THEN 2 tablets a day on 11/10, 11/11, and 11/12 THEN 1 tablet a day on 11/13, 11/14, and 11/15 THEN 1/2 tablet a day on 11/16 and 11/17 THEN STOP   traZODone 100 MG tablet Commonly known as: DESYREL TAKE 1 TABLET BY MOUTH EVERYDAY AT BEDTIME What changed: See the new instructions.   Trelegy Ellipta 200-62.5-25 MCG/INH Aepb Generic drug: Fluticasone-Umeclidin-Vilant Inhale 1 puff into the lungs daily.            Durable Medical Equipment  (From admission, onward)         Start     Ordered   08/20/20 1002  For home use only DME oxygen  Once       Question Answer Comment  Length of Need Lifetime   Mode or (Route) Nasal cannula   Liters per Minute 6   Frequency Continuous (stationary and portable oxygen unit needed)   Oxygen conserving device Yes   Oxygen delivery system Gas      08/20/20  1002   08/18/20 1731  For home use only DME 3 n 1  Once        08/18/20 1730          Day of Discharge BP 128/67 (BP Location: Left Arm)   Pulse 72   Temp 98.5 F (36.9 C) (Oral)   Resp 18   Ht 5\' 6"  (1.676 m)   Wt 61.3 kg   SpO2 92%   BMI 21.81 kg/m   Physical Exam: General: No acute respiratory distress Lungs: Mild expiratory wheezing which is much improved -faint basilar crackles -significantly improved air movement throughout all fields Cardiovascular: Regular rate and rhythm without murmur gallop or rub normal S1 and S2 Abdomen: Nontender, nondistended, soft, bowel sounds positive, no rebound, no ascites, no appreciable mass Extremities: No significant cyanosis, clubbing, or edema bilateral lower extremities  Basic Metabolic Panel: Recent Labs  Lab 08/15/20 0645 08/18/20 0421 08/19/20 0428 08/20/20 0545 08/21/20 0534  NA 133* 133* 136 133* 133*  K 4.2 4.3 3.3* 4.1 3.8  CL 99 95* 92* 89* 91*  CO2 22 29 32 34* 33*  GLUCOSE 78 112* 86 93 115*  BUN 19 19 23 22 20    CREATININE 0.94 0.75 0.84 0.93 0.88  CALCIUM 7.9* 8.8* 8.8* 8.8* 8.8*  MG  --  2.3 1.9 1.8 1.9    Liver Function Tests: Recent Labs  Lab 08/18/20 0421  AST 46*  ALT 50*  ALKPHOS 68  BILITOT 0.7  PROT 7.1  ALBUMIN 4.0    CBC: Recent Labs  Lab 08/15/20 0645 08/18/20 0421  WBC 6.2 5.0  HGB 14.9 13.2  HCT 45.8 41.1  MCV 92.5 93.2  PLT 381 343   BNP (last 3 results) Recent Labs    05/20/20 1657  BNP 99.7   Recent Results (from the past 240 hour(s))  Respiratory Panel by RT PCR (Flu A&B, Covid) - Nasopharyngeal Swab     Status: None   Collection Time: 08/14/20 11:16 AM   Specimen: Nasopharyngeal Swab  Result Value Ref Range Status   SARS Coronavirus 2 by RT PCR NEGATIVE NEGATIVE Final    Comment: (NOTE) SARS-CoV-2 target nucleic acids are NOT DETECTED.  The SARS-CoV-2 RNA is generally detectable in upper respiratoy specimens during the acute phase of infection. The lowest concentration of SARS-CoV-2 viral copies this assay can detect is 131 copies/mL. A negative result does not preclude SARS-Cov-2 infection and should not be used as the sole basis for treatment or other patient management decisions. A negative result may occur with  improper specimen collection/handling, submission of specimen other than nasopharyngeal swab, presence of viral mutation(s) within the areas targeted by this assay, and inadequate number of viral copies (<131 copies/mL). A negative result must be combined with clinical observations, patient history, and epidemiological information. The expected result is Negative.  Fact Sheet for Patients:  PinkCheek.be  Fact Sheet for Healthcare Providers:  GravelBags.it  This test is no t yet approved or cleared by the [place] FDA and  has been authorized for detection and/or diagnosis of SARS-CoV-2 by FDA under an Emergency Use Authorization (EUA). This EUA will remain  in effect  (meaning this test can be used) for the duration of the COVID-19 declaration under Section 564(b)(1) of the Act, 21 U.S.C. section 360bbb-3(b)(1), unless the authorization is terminated or revoked sooner.     Influenza A by PCR NEGATIVE NEGATIVE Final   Influenza B by PCR NEGATIVE NEGATIVE Final    Comment: (NOTE) The Xpert  Xpress SARS-CoV-2/FLU/RSV assay is intended as an aid in  the diagnosis of influenza from Nasopharyngeal swab specimens and  should not be used as a sole basis for treatment. Nasal washings and  aspirates are unacceptable for Xpert Xpress SARS-CoV-2/FLU/RSV  testing.  Fact Sheet for Patients: PinkCheek.be  Fact Sheet for Healthcare Providers: GravelBags.it  This test is not yet approved or cleared by the [place] FDA and  has been authorized for detection and/or diagnosis of SARS-CoV-2 by  FDA under an Emergency Use Authorization (EUA). This EUA will remain  in effect (meaning this test can be used) for the duration of the  Covid-19 declaration under Section 564(b)(1) of the Act, 21  U.S.C. section 360bbb-3(b)(1), unless the authorization is  terminated or revoked. Performed at [facility], [place] [address], [place], [place] [address]      Time spent in discharge (includes decision making & examination of pt): 35 minutes  08/21/2020, 11:55 AM   Cherene Altes, MD [facility] Office  [phone]

## 2020-08-22 ENCOUNTER — Telehealth: Payer: Self-pay | Admitting: Family Medicine

## 2020-08-22 NOTE — Telephone Encounter (Signed)
Transition Care Management Follow-up Telephone Call  Date of discharge and from where: 08/21/2020 from [place]  How have you been since you were released from the hospital? Patient states still SOB, and on 4L of Oxygen, states that she has still been having low energy and coughing  .  Any questions or concerns? Yes would like to if she could get tessalon perles, and wants to know about energy   Items Reviewed:  Did the pt receive and understand the discharge instructions provided? Yes   Medications obtained and verified? Yes   Other? Yes   Any new allergies since your discharge? No   Dietary orders reviewed? Yes  Do you have support at home? Yes   Home Care and Equipment/Supplies: Were home health services ordered? yes If so, what is the name of the agency? [facility]  Has the agency set up a time to come to the patient's home? yes Were any new equipment or medical supplies ordered?  No What is the name of the medical supply agency? N/A  Were you able to get the supplies/equipment? not applicable Do you have any questions related to the use of the equipment or supplies? No  Functional Questionnaire: (I = Independent and D = Dependent) ADLs: I  Bathing/Dressing- I  Meal Prep- I  Eating- I  Maintaining continence- I  Transferring/Ambulation- I  Managing Meds- I  Follow up appointments reviewed:   PCP Hospital f/u appt confirmed? Yes  Scheduled to see Dr. Martinique  on 08/26/2020 @ 2:00 PM.  [facility] f/u appt confirmed? No  .  Are transportation arrangements needed? No   If their condition worsens, is the pt aware to call PCP or go to the Emergency Dept.? Yes  Was the patient provided with contact information for the PCP's office or ED? Yes  Was to pt encouraged to call back with questions or concerns? Yes

## 2020-08-23 ENCOUNTER — Telehealth: Payer: Self-pay | Admitting: Family Medicine

## 2020-08-23 DIAGNOSIS — I739 Peripheral vascular disease, unspecified: Secondary | ICD-10-CM

## 2020-08-23 DIAGNOSIS — I25119 Atherosclerotic heart disease of native coronary artery with unspecified angina pectoris: Secondary | ICD-10-CM

## 2020-08-23 DIAGNOSIS — I5021 Acute systolic (congestive) heart failure: Secondary | ICD-10-CM

## 2020-08-23 NOTE — Telephone Encounter (Signed)
[name] is calling and requesting verbal orders for physical therapy for 2 week 1, 1 week 1, 2 week 2 and 1 week 1. Also if a wheelchair order can be faxed to a medical supply company, please advise. CB is [phone]

## 2020-08-23 NOTE — Telephone Encounter (Signed)
It is okay to give verbal authorization to requested services. Prescription for wheelchair can be faxed to medical supply company. Thanks, BJ

## 2020-08-23 NOTE — Telephone Encounter (Signed)
VO given, order for wheelchair entered & message sent to [employer].

## 2020-08-23 NOTE — Telephone Encounter (Signed)
VO okay? And okay for wheelchair?

## 2020-08-26 ENCOUNTER — Telehealth: Payer: Self-pay

## 2020-08-26 ENCOUNTER — Telehealth (INDEPENDENT_AMBULATORY_CARE_PROVIDER_SITE_OTHER): Payer: Medicare Other | Admitting: Family Medicine

## 2020-08-26 ENCOUNTER — Encounter: Payer: Self-pay | Admitting: Family Medicine

## 2020-08-26 VITALS — BP 117/75 | HR 85 | Ht 66.0 in

## 2020-08-26 DIAGNOSIS — G4700 Insomnia, unspecified: Secondary | ICD-10-CM | POA: Diagnosis not present

## 2020-08-26 DIAGNOSIS — J441 Chronic obstructive pulmonary disease with (acute) exacerbation: Secondary | ICD-10-CM

## 2020-08-26 DIAGNOSIS — I5020 Unspecified systolic (congestive) heart failure: Secondary | ICD-10-CM | POA: Diagnosis not present

## 2020-08-26 DIAGNOSIS — E876 Hypokalemia: Secondary | ICD-10-CM

## 2020-08-26 DIAGNOSIS — E039 Hypothyroidism, unspecified: Secondary | ICD-10-CM

## 2020-08-26 DIAGNOSIS — F419 Anxiety disorder, unspecified: Secondary | ICD-10-CM

## 2020-08-26 NOTE — Telephone Encounter (Signed)
Okay for VO?

## 2020-08-26 NOTE — Telephone Encounter (Signed)
It is ok to give verbal authorization for requested services. Thanks, BJ

## 2020-08-26 NOTE — Telephone Encounter (Signed)
[facility] calling to get verbal orders for this patient   OT  2x week 1 1x week 1 2x week 1 1x week 1   Incident to report: patient fell. Patient had a fall while using nebulizer. Took her oxygen off while using the nebulizer and passed out reports hitting her face on the window. Reports laceration on left shoulder    Please call and advise

## 2020-08-26 NOTE — Progress Notes (Addendum)
Virtual Visit via Video Note I connected with [name] on 08/26/20 by a video enabled telemedicine application and verified that I am speaking with the correct person using two identifiers.  Location patient: home Location provider:work office Persons participating in the virtual visit: patient, son,provider She has hearing loss,so her son helps with interrogation.  I discussed the limitations of evaluation and management by telemedicine and the availability of in person appointments. The patient expressed understanding and agreed to proceed.  HPI: [name] is a 74 yo female with hx of GERD, CAD,COPD,PAD,hypothyroidism,depression,anxiety,and HLD following today on recent hospitalization. Hospitalized from 08/14/2020 to 08/21/2020. Admission diagnoses COPD and CHF exacerbation. She presented to the ER because gradually worsening dyspnea and wheezing for the past 4 weeks requiring higher O2 supplementation.  She needs assistance with ADL's and IADL's. Using a walker.  PT and OT 2 times per week. She was discharged on 4 LPM at rest and 6 LMP with exertion. is now on 3 LPM of supplemental O2 per [place]. She is currently on Prednisone taper. She is also on Trelegy Ellipta 200-62.5-25 mcg 1 puff daily and Duoneb q 6 hours prn. She is gradually improving, still not at her baseline. DOE and wheezing with exertion. Cough has improved.  Negative for fever,chill,CP,orthopnea,PND,on edema. Appetite has improved. + Fatigue. She was discharged on Furosemide 40 mg bid.  He was instructed to follow-up with her pulmonologist, still has no appt.  BP's 120's/60's.  HypoK+: She is on KLOR 20 mg bid.  Lab Results  Component Value Date   CREATININE 0.88 08/21/2020   BUN 20 08/21/2020   NA 133 (L) 08/21/2020   K 3.8 08/21/2020   CL 91 (L) 08/21/2020   CO2 33 (H) 08/21/2020   Lab Results  Component Value Date   WBC 5.0 08/18/2020   HGB 13.2 08/18/2020   HCT 41.1 08/18/2020   MCV 93.2  08/18/2020   PLT 343 08/18/2020   Noted left periocular ecchymosis.  Apparently she had a fall yesterday, "passed out" for a few seconds.She hit her face on the window and had a small laceration on left shoulder.Negative for Christy change,visual changes, or headache.  Anxiety and insomnia:She is on Ativan 2 mg at bedtime and Trazodone 100 mg. Sleeping about 10-12 hours. She is also taking Paroxetine 20 mg daily, which has helped with depressed mood and anxiety.  She has not noted abdominal pain,N/V,changes in bowel habits, or urinary symptoms. HypoMg: She is not on Mg supplementation. She has no concerns at this time.  Hypothyroidism: She is on Synthroid 112 mcg daily Lab Results  Component Value Date   TSH 0.433 05/21/2020   ROS: See pertinent positives and negatives per HPI.  Past Medical History:  Diagnosis Date  . Bilateral leg cramps   . Bladder neoplasm   . Cancer ([place])   . CHF (congestive heart failure) ([place])   . Chronic deep vein thrombosis (DVT) of right lower extremity ([place])    05/ 2018  chronic non-occlusive DVT RLE  . COPD, frequent exacerbations ([place])    03-06-2018 per pt last exacerbation 12/ 2018  . Coronary artery disease    cardiologist-  dr Aundra Dubin-- 03-11-2018 er cath , 80% stenosis in the ostial second diagonal  . Dyspnea on minimal exertion   . Emphysema/COPD ([place])    CAT score- 17  . Family history of breast cancer   . Family history of colon cancer   . Family history of melanoma   . Family history of ovarian  cancer   . Family history of pancreatic cancer   . Fibromyalgia 1995  . GERD (gastroesophageal reflux disease)   . Hiatal hernia   . History of breast cancer   . History of diverticulitis of colon    2002  s/p  sigmoid colectomy  . History of multiple pulmonary nodules    hx RUL nodules x2  s/p right VATS w/ wedge resection 09-11-2005 and 06-14-2011  both  necrotizing granulomatous inflammation w/ cystic area of necrosis & focal calcification  .  History of right breast cancer oncologist-  dr Jana Hakim-- no recurrence   dx 2010--  IDC, Stage IA , ER/PR+,  (pT1c pN0) 11-09-2008 right lumpectomy;  12-18-2008 right simple mastectomy for DCIS margins;  completed chemotherapy 2010 (no radiation) and completed antiestrogen therapy  . Hx of colonic polyps    Dr. Cristina Gong -last study '11  . Hyperlipidemia   . Hypertension   . Hypothyroidism   . 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 Nocturia   . OA (osteoarthritis)    thumbs  . On supplemental oxygen therapy    03-06-2018 per pt uses only at night,  checks O2 sats at home,  stated am sat 89% after moving around average 93-94% with RA  . Osteoporosis   . Peripheral arterial occlusive disease [facility]) vascular-- dr chen/ dr Donzetta Matters   proximal right SFA severe focal stenosis with collaterals from the PFA/  04/ 2012 occluded celiac and SMA arteries with distal reconstitution w/ patent IMA  . Peripheral vascular disease ([place])    chronic DVT RLE,  mesenteric vascular disease  . Right lower lobe pulmonary nodule    Chest CT 08-29-2017  . Stage 3 severe COPD by GOLD classification ([place]) hx frequent exacerbations--   pulmologist-  dr Maryann Alar--  per lov note , dated 12-20-2017, oxyogen 2L is prescribed for use with exertion (but pt only uses mostly at night), O2 sats on RA run the 80s , this day sat 86% RA and with 2L O2 sat 92%  . Varicose veins of leg with swelling    varicose vein surgery - Dr. Aleda Grana  . Wears glasses   . Wears hearing aid in both ears     Past Surgical History:  Procedure Laterality Date  . ABDOMINAL AORTOGRAM N/A 07/11/2020   Procedure: ABDOMINAL AORTOGRAM;  Surgeon: Waynetta Sandy, MD;  Location: [place] [facility];  Service: Cardiovascular;  Laterality: N/A;  . AORTIC ARCH ANGIOGRAPHY N/A 07/11/2020   Procedure: AORTIC ARCH ANGIOGRAPHY;  Surgeon: Waynetta Sandy, MD;  Location: [place] [facility];  Service:  Cardiovascular;  Laterality: N/A;  . BREAST EXCISIONAL BIOPSY Left 10/15/2008  . BREAST LUMPECTOMY W/ NEEDLE LOCALIZATION Right 11-09-2008    dr Brantley Stage   [facility]  . CARDIAC CATHETERIZATION  03-12-2011   dr Aundra Dubin   70-80% ostial stenosis in small second diagonal (appears to small for intervention),  dLAD 40-50%,  minimal luminal irregulartieis involoving LCFx and RCA,  LVEF 55%  . CATARACT EXTRACTION W/ INTRAOCULAR LENS  IMPLANT, BILATERAL  2011  . CYSTOSCOPY N/A 03/11/2018   Procedure: CYSTOSCOPY WITH INSTILLATION OF POST OPERATIVE EPIRUBICIN;  Surgeon: Festus Aloe, MD;  Location: [facility];  Service: Urology;  Laterality: N/A;  . LEFT HEART CATH AND CORONARY ANGIOGRAPHY N/A 05/25/2020   Procedure: LEFT HEART CATH AND CORONARY ANGIOGRAPHY;  Surgeon: Troy Sine, MD;  Location: [place] [facility];  Service: Cardiovascular;  Laterality: N/A;  . MASTECTOMY Right   .  PARTIAL COLECTOMY  2002   sigmoid and appectomy (diverticulitis)  . PARTIAL MASTECTOMY WITH NEEDLE LOCALIZATION Left 02/23/2013   Procedure:  LEFT PARTIAL MASTECTOMY WITH NEEDLE LOCALIZATION;  Surgeon: Adin Hector, MD;  Location: [place];  Service: General;  Laterality: Left;  . PERIPHERAL VASCULAR INTERVENTION  07/11/2020   Procedure: PERIPHERAL VASCULAR INTERVENTION;  Surgeon: Waynetta Sandy, MD;  Location: [place] [facility];  Service: Cardiovascular;;  . RECONSTRUCTION BREAST W/ LATISSIMUS DORSI FLAP Right 05-30-2009   dr Harlow Mares  [facility]  . REDUCTION MAMMAPLASTY Left   . SIMPLE MASTECTOMY Right 12-28-2008    dr Brantley Stage  [facility]  . TRANSTHORACIC ECHOCARDIOGRAM  07-09-2016  dr Aundra Dubin   ef 55-60%, grade 1 diastoic dysfunction/  mild TR  . TRANSURETHRAL RESECTION OF BLADDER TUMOR N/A 03/11/2018   Procedure: TRANSURETHRAL RESECTION OF BLADDER TUMOR (TURBT) 2-5cm;  Surgeon: Festus Aloe, MD;  [facility];  Service: Urology;  Laterality: N/A;  . UPPER EXTREMITY ANGIOGRAPHY Left 07/11/2020    Procedure: Upper Extremity Angiography;  Surgeon: Waynetta Sandy, MD;  Location: [place] [facility];  Service: Cardiovascular;  Laterality: Left;  Marland Kitchen VAGINAL HYSTERECTOMY  1973  . VIDEO ASSISTED THORACOSCOPY (VATS)/WEDGE RESECTION Right 09-11-2005  &  06-14-2011   dr Fara [facility]   both RUL    Family History  Problem Relation Age of Onset  . Colon cancer Mother        colon  . COPD Mother        brown lung  . Hypertension Mother   . Diabetes Mother   . Emphysema Mother   . Coronary artery disease Father   . Heart attack Father   . Sudden death Father   . Heart disease Father   . Cancer Sister        throat  . Hypertension Sister   . Coronary artery disease Brother   . Heart attack Brother        early 37s  . Throat cancer Sister   . Ovarian cancer Sister        fallopian tube cancer in her 95s  . Melanoma Sister   . Hypertension Sister   . Pancreatic cancer Sister   . Melanoma Niece        dx in her 32s  . Breast cancer Niece 84    Social History   Socioeconomic History  . Marital status: Married    Spouse name: Not on file  . Number of children: 3  . Years of education: 59  . Highest education level: Not on file  Occupational History  . Occupation: Chiropractor: RETIRED    Comment: retired  Tobacco Use  . Smoking status: Former Smoker    Packs/day: 0.75    Years: 50.00    Pack years: 37.50    Types: Cigarettes    Quit date: 05/18/2016    Years since quitting: 4.2  . Smokeless tobacco: Never Used  Vaping Use  . Vaping Use: Never used  Substance and Sexual Activity  . Alcohol use: Yes    Alcohol/week: 10.0 standard drinks    Types: 10 Cans of beer per week  . Drug use: No  . Sexual activity: Not on file  Other Topics Concern  . Not on file  Social History Narrative   HSG, beauty school. Married '69 -23 yrs/widowed; married '79 - 39yr/divorced; married '87.  2 sons - '70, '74, 1 srtep-son; 1 grandchild. 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 SO - good health.  NO history of abuse.  ACP - full code and all heroic measures.    Social Determinants of Health   Financial Resource Strain:   . Difficulty of Paying Living Expenses: Not on file  Food Insecurity:   . Worried About Charity fundraiser in the Last Year: Not on file  . Ran Out of Food in the Last Year: Not on file  Transportation Needs:   . Lack of Transportation (Medical): Not on file  . Lack of Transportation (Non-Medical): Not on file  Physical Activity:   . Days of Exercise per Week: Not on file  . Minutes of Exercise per Session: Not on file  Stress:   . Feeling of Stress : Not on file  Social Connections:   . Frequency of Communication with Friends and Family: Not on file  . Frequency of Social Gatherings with Friends and Family: Not on file  . Attends Religious Services: Not on file  . Active Member of Clubs or Organizations: Not on file  . Attends Archivist Meetings: Not on file  . Marital Status: Not on file  Intimate Partner Violence:   . Fear of Current or Ex-Partner: Not on file  . Emotionally Abused: Not on file  . Physically Abused: Not on file  . Sexually Abused: Not on file    Current Outpatient Medications:  .  acetaminophen (TYLENOL) 325 MG tablet, Take 2 tablets (650 mg total) by mouth every 6 (six) hours as needed for mild pain (or Fever >/= 101)., Disp: , Rfl:  .  albuterol (VENTOLIN HFA) 108 (90 Base) MCG/ACT inhaler, Inhale 2 puffs into the lungs every 6 (six) hours as needed for wheezing or shortness of breath., Disp: 28 g, Rfl: 3 .  aspirin EC 81 MG tablet, Take 1 tablet (81 mg total) by mouth daily., Disp: 30 tablet, Rfl:  .  atorvastatin (LIPITOR) 80 MG tablet, TAKE 1 TABLET BY MOUTH EVERY DAY (Patient taking differently: Take 80 mg by mouth daily. ), Disp: 90 tablet, Rfl: 1 .  benzonatate (TESSALON PERLES) 100 MG capsule, Take 1 capsule (100 mg total) by mouth 3 (three) times daily as needed for cough., Disp: 30  capsule, Rfl: 0 .  Biotin 10000 MCG TABS, Take 10,000 mcg by mouth daily., Disp: , Rfl:  .  cilostazol (PLETAL) 100 MG tablet, Take 1 tablet (100 mg total) by mouth 2 (two) times daily., Disp: 60 tablet, Rfl: 11 .  clopidogrel (PLAVIX) 75 MG tablet, Take 1 tablet (75 mg total) by mouth daily., Disp: 30 tablet, Rfl: 11 .  dextromethorphan-guaiFENesin (MUCINEX DM) 30-600 MG 12hr tablet, Take 1 tablet by mouth 2 (two) times daily., Disp: , Rfl:  .  famotidine (PEPCID) 40 MG tablet, Take 1 tablet (40 mg total) by mouth at bedtime., Disp: 30 tablet, Rfl: 0 .  fluticasone (FLONASE) 50 MCG/ACT nasal spray, USE 1 SPRAY INTO EACH NOSTRIL TWICE A DAY (Patient taking differently: Place 2 sprays into both nostrils in the morning and at bedtime. ), Disp: 48 mL, Rfl: 1 .  Fluticasone-Umeclidin-Vilant (TRELEGY ELLIPTA) 200-62.5-25 MCG/INH AEPB, Inhale 1 puff into the lungs daily., Disp: 60 each, Rfl: 5 .  furosemide (LASIX) 40 MG tablet, Take 1 tablet (40 mg total) by mouth 2 (two) times daily., Disp: 60 tablet, Rfl: 1 .  ipratropium-albuterol (DUONEB) 0.5-2.5 (3) MG/3ML SOLN, Inhale 3 mLs into the lungs every 6 (six) hours as needed (sob, wheezing)., Disp: 360 mL, Rfl: 6 .  levothyroxine (SYNTHROID) 112 MCG tablet, TAKE 1 TABLET (112 MCG TOTAL) BY MOUTH DAILY BEFORE BREAKFAST., Disp: 90 tablet, Rfl: 1 .  LORazepam (ATIVAN) 2 MG tablet, Take 1 tablet (2 mg total) by mouth at bedtime., Disp: 30 tablet, Rfl: 2 .  montelukast (SINGULAIR) 10 MG tablet, Take 1 tablet (10 mg total) by mouth at bedtime., Disp: 90 tablet, Rfl: 3 .  pantoprazole (PROTONIX) 40 MG tablet, Take 1 tablet (40 mg total) by mouth daily., Disp: 30 tablet, Rfl: 1 .  PARoxetine (PAXIL) 20 MG tablet, TAKE 1 TABLET BY MOUTH EVERYDAY AT BEDTIME (Patient taking differently: Take 20 mg by mouth daily. ), Disp: 30 tablet, Rfl: 1 .  potassium chloride SA (KLOR-CON) 20 MEQ tablet, Take 2 tablets (40 mEq total) by mouth 2 (two) times daily., Disp: 120 tablet,  Rfl: 1 .  predniSONE (DELTASONE) 20 MG tablet, Take 3 tablets a day on 11/8 and 11/9 THEN 2 tablets a day on 11/10, 11/11, and 11/12 THEN 1 tablet a day on 11/13, 11/14, and 11/15 THEN 1/2 tablet a day on 11/16 and 11/17 THEN STOP, Disp: 16 tablet, Rfl: 0 .  traZODone (DESYREL) 100 MG tablet, TAKE 1 TABLET BY MOUTH EVERYDAY AT BEDTIME (Patient taking differently: Take 100 mg by mouth at bedtime. ), Disp: 90 tablet, Rfl: 1  EXAM:  VITALS per patient if applicable:BP 161/09   Pulse 85   Ht 5\' 6"  (1.676 m)   SpO2 96%   BMI 21.81 kg/m   GENERAL: alert, oriented, appears well and in no acute distress  HEENT: atraumatic, conjunctiva clear, no obvious abnormalities on inspection of external nose and ears  NECK: normal movements of the head and neck  LUNGS: on inspection no signs of respiratory distress, breathing rate appears normal, no obvious gross SOB, gasping or wheezing. On supplemental O2 3 LPM [place].  CV: no obvious cyanosis  Skin: Left periocular ecchymosis.  PSYCH/NEURO: pleasant and cooperative, no obvious depression or anxiety, speech and thought processing grossly intact  ASSESSMENT AND PLAN:  Discussed the following assessment and plan:  COPD, frequent exacerbations ([place]) Still symptomatic but gradually improving. Complete Prednisone taper and continue rest of medications.  HFrEF (heart failure with reduced ejection fraction) (HCC) - Plan: BASIC METABOLIC PANEL WITH GFR Continue Furosemide 40 mg bid. Echo in 05/2020: LVEF 45-50%. Because hypotension she is not on BB or ACEI/ARB. BNP 99.7 3 months ago. She has an appt with her cardiologist on 09/27/20.  Anxiety disorder, unspecified type Stable. Continue Ativan 2 mg at bedtime,Trazodone 100 mg,and Paroxetine 20 mg. We discussed some side effects and risk of interactions. We also reviewed some symptoms of serotonin synd. Insomnia, unspecified type  Hypothyroidism, unspecified type - Plan: TSH Continue Synthroid 112  mcg daily. Placed order for TSH to be done with next blood work.  Hypomagnesemia - Plan: Magnesium Replanted during hospitalization.  Hypokalemia - Plan: BASIC METABOLIC PANEL WITH GFR Continue KLOR 40 mg bid. BMP to be done with next blood work. Instructed about warning signs.  Fall precautions. Slowly improving. She will monitor for new symptoms. Continue monitoring BP/VS.  I discussed the assessment and treatment plan with the patient. Christy Leachman was provided an opportunity to ask questions and all were answered. She agreed with the plan and demonstrated an understanding of the instructions.    Return in about 2 months (around 10/26/2020) for [place]..   Demitria Hay Martin[place], MD

## 2020-08-27 ENCOUNTER — Encounter: Payer: Self-pay | Admitting: Family Medicine

## 2020-08-27 MED ORDER — LORAZEPAM 2 MG PO TABS: 2.0000 mg | ORAL_TABLET | Freq: Every day | ORAL | 3 refills | Status: DC

## 2020-08-29 ENCOUNTER — Other Ambulatory Visit: Payer: Self-pay

## 2020-08-29 MED ORDER — PAROXETINE HCL 20 MG PO TABS
ORAL_TABLET | ORAL | 1 refills | Status: DC
Start: 2020-08-29 — End: 2021-02-27

## 2020-08-29 NOTE — Telephone Encounter (Signed)
VO given.

## 2020-09-05 ENCOUNTER — Other Ambulatory Visit: Payer: Self-pay | Admitting: Primary Care

## 2020-09-09 ENCOUNTER — Other Ambulatory Visit: Payer: Self-pay | Admitting: Family Medicine

## 2020-09-09 ENCOUNTER — Telehealth: Payer: Self-pay | Admitting: Family Medicine

## 2020-09-09 DIAGNOSIS — K219 Gastro-esophageal reflux disease without esophagitis: Secondary | ICD-10-CM

## 2020-09-09 NOTE — Telephone Encounter (Signed)
Received a call from [name] about [name]'s weight going up by 2 lbs  Wt Readings from Last 3 Encounters:  08/20/20 135 lb 2.3 oz (61.3 kg)  07/27/20 135 lb (61.2 kg)  07/11/20 137 lb (62.1 kg)   She was admitted with a CHF exacerbation earlier this month Spoke with [name]- she says she feels "terrible," that she has SOB. She states that earlier this week she also had vomiting and diarrhea.  These are now resolved, no fever She wonders if her lasix should be changed/ increased I advised [name] that I am concerned about her SOB, and her weight change is not enough to give the full picture here.  I advised her to go to the ER to be seen.  She states she does not wish to go.  I again advised her that she may have a serious issue and that the ER would be appropriate.  If she chooses not to do this, ok to take 1 extra lasix pill per day over the weekend and call Dr Martinique on Monday for an appt I will send this message to Dr Martinique as well  Most recent chem on chart shows normal BUN and creat, K 3.8- should be ok to increase lasix from 80- 120 daily short term

## 2020-09-10 ENCOUNTER — Emergency Department (HOSPITAL_COMMUNITY): Payer: Medicare Other

## 2020-09-10 ENCOUNTER — Other Ambulatory Visit: Payer: Self-pay

## 2020-09-10 ENCOUNTER — Encounter (HOSPITAL_COMMUNITY): Payer: Self-pay | Admitting: Emergency Medicine

## 2020-09-10 ENCOUNTER — Inpatient Hospital Stay (HOSPITAL_COMMUNITY)
Admission: EM | Admit: 2020-09-10 | Discharge: 2020-09-15 | DRG: 190 | Disposition: A | Discharge status: Home / Self Care | Payer: Medicare Other | Attending: Internal Medicine | Admitting: Internal Medicine

## 2020-09-10 DIAGNOSIS — Z853 Personal history of malignant neoplasm of breast: Secondary | ICD-10-CM | POA: Diagnosis not present

## 2020-09-10 DIAGNOSIS — K551 Chronic vascular disorders of intestine: Secondary | ICD-10-CM | POA: Diagnosis present

## 2020-09-10 DIAGNOSIS — Z20822 Contact with and (suspected) exposure to covid-19: Secondary | ICD-10-CM | POA: Diagnosis present

## 2020-09-10 DIAGNOSIS — K219 Gastro-esophageal reflux disease without esophagitis: Secondary | ICD-10-CM | POA: Diagnosis present

## 2020-09-10 DIAGNOSIS — J441 Chronic obstructive pulmonary disease with (acute) exacerbation: Principal | ICD-10-CM | POA: Diagnosis present

## 2020-09-10 DIAGNOSIS — R339 Retention of urine, unspecified: Secondary | ICD-10-CM | POA: Diagnosis not present

## 2020-09-10 DIAGNOSIS — R0602 Shortness of breath: Secondary | ICD-10-CM

## 2020-09-10 DIAGNOSIS — I2510 Atherosclerotic heart disease of native coronary artery without angina pectoris: Secondary | ICD-10-CM | POA: Diagnosis present

## 2020-09-10 DIAGNOSIS — E785 Hyperlipidemia, unspecified: Secondary | ICD-10-CM | POA: Diagnosis present

## 2020-09-10 DIAGNOSIS — M810 Age-related osteoporosis without current pathological fracture: Secondary | ICD-10-CM | POA: Diagnosis present

## 2020-09-10 DIAGNOSIS — J9621 Acute and chronic respiratory failure with hypoxia: Secondary | ICD-10-CM | POA: Diagnosis present

## 2020-09-10 DIAGNOSIS — Z800 Family history of malignant neoplasm of digestive organs: Secondary | ICD-10-CM

## 2020-09-10 DIAGNOSIS — Z7902 Long term (current) use of antithrombotics/antiplatelets: Secondary | ICD-10-CM | POA: Diagnosis not present

## 2020-09-10 DIAGNOSIS — Z66 Do not resuscitate: Secondary | ICD-10-CM | POA: Diagnosis present

## 2020-09-10 DIAGNOSIS — Z8249 Family history of ischemic heart disease and other diseases of the circulatory system: Secondary | ICD-10-CM

## 2020-09-10 DIAGNOSIS — I5023 Acute on chronic systolic (congestive) heart failure: Secondary | ICD-10-CM | POA: Diagnosis present

## 2020-09-10 DIAGNOSIS — J9601 Acute respiratory failure with hypoxia: Secondary | ICD-10-CM

## 2020-09-10 DIAGNOSIS — Z803 Family history of malignant neoplasm of breast: Secondary | ICD-10-CM

## 2020-09-10 DIAGNOSIS — M797 Fibromyalgia: Secondary | ICD-10-CM

## 2020-09-10 DIAGNOSIS — Z9049 Acquired absence of other specified parts of digestive tract: Secondary | ICD-10-CM

## 2020-09-10 DIAGNOSIS — I25119 Atherosclerotic heart disease of native coronary artery with unspecified angina pectoris: Secondary | ICD-10-CM | POA: Diagnosis not present

## 2020-09-10 DIAGNOSIS — Z808 Family history of malignant neoplasm of other organs or systems: Secondary | ICD-10-CM | POA: Diagnosis not present

## 2020-09-10 DIAGNOSIS — Z8049 Family history of malignant neoplasm of other genital organs: Secondary | ICD-10-CM | POA: Diagnosis not present

## 2020-09-10 DIAGNOSIS — Z8041 Family history of malignant neoplasm of ovary: Secondary | ICD-10-CM

## 2020-09-10 DIAGNOSIS — Z825 Family history of asthma and other chronic lower respiratory diseases: Secondary | ICD-10-CM

## 2020-09-10 DIAGNOSIS — G4733 Obstructive sleep apnea (adult) (pediatric): Secondary | ICD-10-CM | POA: Diagnosis present

## 2020-09-10 DIAGNOSIS — Z833 Family history of diabetes mellitus: Secondary | ICD-10-CM

## 2020-09-10 DIAGNOSIS — I5020 Unspecified systolic (congestive) heart failure: Secondary | ICD-10-CM

## 2020-09-10 DIAGNOSIS — I110 Hypertensive heart disease with heart failure: Secondary | ICD-10-CM | POA: Diagnosis present

## 2020-09-10 DIAGNOSIS — J9611 Chronic respiratory failure with hypoxia: Secondary | ICD-10-CM

## 2020-09-10 DIAGNOSIS — E039 Hypothyroidism, unspecified: Secondary | ICD-10-CM | POA: Diagnosis present

## 2020-09-10 LAB — COMPREHENSIVE METABOLIC PANEL
ALT: 24 U/L (ref 0–44)
AST: 27 U/L (ref 15–41)
Albumin: 3.4 g/dL — ABNORMAL LOW (ref 3.5–5.0)
Alkaline Phosphatase: 69 U/L (ref 38–126)
Anion gap: 10 (ref 5–15)
BUN: 14 mg/dL (ref 8–23)
CO2: 27 mmol/L (ref 22–32)
Calcium: 8.9 mg/dL (ref 8.9–10.3)
Chloride: 98 mmol/L (ref 98–111)
Creatinine, Ser: 0.94 mg/dL (ref 0.44–1.00)
GFR, Estimated: >60 mL/min (ref >60)
Glucose, Bld: 117 mg/dL — ABNORMAL HIGH (ref 70–99)
Potassium: 4.2 mmol/L (ref 3.5–5.1)
Sodium: 135 mmol/L (ref 135–145)
Total Bilirubin: 0.8 mg/dL (ref 0.3–1.2)
Total Protein: 7.3 g/dL (ref 6.5–8.1)

## 2020-09-10 LAB — CBC
HCT: 34.4 % — ABNORMAL LOW (ref 36.0–46.0)
Hemoglobin: 11.5 g/dL — ABNORMAL LOW (ref 12.0–15.0)
MCH: 30.3 pg (ref 26.0–34.0)
MCHC: 33.4 g/dL (ref 30.0–36.0)
MCV: 90.8 fL (ref 80.0–100.0)
Platelets: 314 10*3/uL (ref 150–400)
RBC: 3.79 MIL/uL — ABNORMAL LOW (ref 3.87–5.11)
RDW: 13.9 % (ref 11.5–15.5)
WBC: 13.6 10*3/uL — ABNORMAL HIGH (ref 4.0–10.5)
nRBC: 0 % (ref 0.0–0.2)

## 2020-09-10 LAB — RESP PANEL BY RT-PCR (FLU A&B, COVID) ARPGX2
Influenza A by PCR: NEGATIVE
Influenza B by PCR: NEGATIVE
SARS Coronavirus 2 by RT PCR: NEGATIVE

## 2020-09-10 LAB — BRAIN NATRIURETIC PEPTIDE: B Natriuretic Peptide: 145.4 pg/mL — ABNORMAL HIGH (ref 0.0–100.0)

## 2020-09-10 LAB — TROPONIN I (HIGH SENSITIVITY)
Troponin I (High Sensitivity): 28 ng/L — ABNORMAL HIGH (ref <18)
Troponin I (High Sensitivity): 29 ng/L — ABNORMAL HIGH (ref <18)

## 2020-09-10 MED ORDER — ASPIRIN EC 81 MG PO TBEC
81.0000 mg | DELAYED_RELEASE_TABLET | Freq: Every day | ORAL | Status: DC
  Administered 2020-09-10 – 2020-09-15 (×6): 81 mg via ORAL
  Filled 2020-09-10 (×6): qty 1

## 2020-09-10 MED ORDER — ACETAMINOPHEN 325 MG PO TABS: 650.0000 mg | ORAL_TABLET | Freq: Four times a day (QID) | ORAL | Status: DC | PRN

## 2020-09-10 MED ORDER — MOMETASONE FURO-FORMOTEROL FUM 200-5 MCG/ACT IN AERO
2.0000 | INHALATION_SPRAY | Freq: Two times a day (BID) | RESPIRATORY_TRACT | Status: DC
  Administered 2020-09-10 – 2020-09-11 (×2): 2 via RESPIRATORY_TRACT
  Filled 2020-09-10: qty 8.8

## 2020-09-10 MED ORDER — ALBUTEROL SULFATE HFA 108 (90 BASE) MCG/ACT IN AERS
6.0000 | INHALATION_SPRAY | Freq: Once | RESPIRATORY_TRACT | Status: AC
  Administered 2020-09-10: 6 via RESPIRATORY_TRACT
  Filled 2020-09-10: qty 6.7

## 2020-09-10 MED ORDER — FUROSEMIDE 10 MG/ML IJ SOLN
60.0000 mg | Freq: Once | INTRAMUSCULAR | Status: AC
  Administered 2020-09-10: 60 mg via INTRAVENOUS
  Filled 2020-09-10: qty 8

## 2020-09-10 MED ORDER — ATORVASTATIN CALCIUM 40 MG PO TABS
80.0000 mg | ORAL_TABLET | Freq: Every day | ORAL | Status: DC
  Administered 2020-09-10 – 2020-09-15 (×6): 80 mg via ORAL
  Filled 2020-09-10 (×6): qty 2

## 2020-09-10 MED ORDER — POTASSIUM CHLORIDE CRYS ER 20 MEQ PO TBCR
40.0000 meq | EXTENDED_RELEASE_TABLET | Freq: Two times a day (BID) | ORAL | Status: DC
  Administered 2020-09-10 – 2020-09-15 (×10): 40 meq via ORAL
  Filled 2020-09-10 (×10): qty 2

## 2020-09-10 MED ORDER — METHYLPREDNISOLONE SODIUM SUCC 125 MG IJ SOLR
125.0000 mg | Freq: Once | INTRAMUSCULAR | Status: AC
  Administered 2020-09-10: 125 mg via INTRAVENOUS
  Filled 2020-09-10: qty 2

## 2020-09-10 MED ORDER — PREDNISONE 20 MG PO TABS: 40.0000 mg | ORAL_TABLET | Freq: Every day | ORAL | Status: DC

## 2020-09-10 MED ORDER — MONTELUKAST SODIUM 10 MG PO TABS
10.0000 mg | ORAL_TABLET | Freq: Every day | ORAL | Status: DC
  Administered 2020-09-10 – 2020-09-14 (×5): 10 mg via ORAL
  Filled 2020-09-10 (×5): qty 1

## 2020-09-10 MED ORDER — CILOSTAZOL 100 MG PO TABS
100.0000 mg | ORAL_TABLET | Freq: Two times a day (BID) | ORAL | Status: DC
  Administered 2020-09-10 – 2020-09-15 (×10): 100 mg via ORAL
  Filled 2020-09-10 (×11): qty 1

## 2020-09-10 MED ORDER — FUROSEMIDE 10 MG/ML IJ SOLN
60.0000 mg | Freq: Two times a day (BID) | INTRAMUSCULAR | Status: DC
  Administered 2020-09-10 – 2020-09-12 (×4): 60 mg via INTRAVENOUS
  Filled 2020-09-10 (×5): qty 6

## 2020-09-10 MED ORDER — ALBUTEROL SULFATE (2.5 MG/3ML) 0.083% IN NEBU
2.5000 mg | INHALATION_SOLUTION | Freq: Four times a day (QID) | RESPIRATORY_TRACT | Status: DC
  Administered 2020-09-10 – 2020-09-11 (×4): 2.5 mg via RESPIRATORY_TRACT
  Filled 2020-09-10 (×4): qty 3

## 2020-09-10 MED ORDER — CLOPIDOGREL BISULFATE 75 MG PO TABS
75.0000 mg | ORAL_TABLET | Freq: Every day | ORAL | Status: DC
  Administered 2020-09-10 – 2020-09-15 (×6): 75 mg via ORAL
  Filled 2020-09-10 (×6): qty 1

## 2020-09-10 MED ORDER — TRAZODONE HCL 100 MG PO TABS
100.0000 mg | ORAL_TABLET | Freq: Every day | ORAL | Status: DC
  Administered 2020-09-10 – 2020-09-14 (×5): 100 mg via ORAL
  Filled 2020-09-10 (×5): qty 1

## 2020-09-10 MED ORDER — UMECLIDINIUM BROMIDE 62.5 MCG/INH IN AEPB
1.0000 | INHALATION_SPRAY | Freq: Every day | RESPIRATORY_TRACT | Status: DC
  Administered 2020-09-11: 1 via RESPIRATORY_TRACT
  Filled 2020-09-10: qty 7

## 2020-09-10 MED ORDER — ENOXAPARIN SODIUM 40 MG/0.4ML ~~LOC~~ SOLN
40.0000 mg | SUBCUTANEOUS | Status: DC
  Administered 2020-09-10 – 2020-09-15 (×6): 40 mg via SUBCUTANEOUS
  Filled 2020-09-10 (×6): qty 0.4

## 2020-09-10 MED ORDER — ACETAMINOPHEN 650 MG RE SUPP: 650.0000 mg | Freq: Four times a day (QID) | RECTAL | Status: DC | PRN

## 2020-09-10 MED ORDER — DOXYCYCLINE HYCLATE 100 MG PO TABS
100.0000 mg | ORAL_TABLET | Freq: Two times a day (BID) | ORAL | Status: DC
  Administered 2020-09-10 – 2020-09-15 (×11): 100 mg via ORAL
  Filled 2020-09-10 (×11): qty 1

## 2020-09-10 MED ORDER — LEVOTHYROXINE SODIUM 112 MCG PO TABS
112.0000 ug | ORAL_TABLET | Freq: Every day | ORAL | Status: DC
  Administered 2020-09-11 – 2020-09-15 (×5): 112 ug via ORAL
  Filled 2020-09-10 (×5): qty 1

## 2020-09-10 MED ORDER — ONDANSETRON HCL 4 MG/2ML IJ SOLN
4.0000 mg | Freq: Four times a day (QID) | INTRAMUSCULAR | Status: DC | PRN
  Administered 2020-09-11: 4 mg via INTRAVENOUS
  Filled 2020-09-10 (×2): qty 2

## 2020-09-10 MED ORDER — LORAZEPAM 1 MG PO TABS
2.0000 mg | ORAL_TABLET | Freq: Every day | ORAL | Status: DC
  Administered 2020-09-10 – 2020-09-14 (×5): 2 mg via ORAL
  Filled 2020-09-10 (×5): qty 2

## 2020-09-10 MED ORDER — ONDANSETRON HCL 4 MG PO TABS: 4.0000 mg | ORAL_TABLET | Freq: Four times a day (QID) | ORAL | Status: DC | PRN

## 2020-09-10 MED ORDER — SALINE SPRAY 0.65 % NA SOLN
1.0000 | NASAL | Status: DC | PRN
  Administered 2020-09-10: 1 via NASAL
  Filled 2020-09-10: qty 44

## 2020-09-10 MED ORDER — FAMOTIDINE 20 MG PO TABS
40.0000 mg | ORAL_TABLET | Freq: Every day | ORAL | Status: DC
  Administered 2020-09-10 – 2020-09-14 (×5): 40 mg via ORAL
  Filled 2020-09-10 (×5): qty 2

## 2020-09-10 MED ORDER — METHYLPREDNISOLONE SODIUM SUCC 40 MG IJ SOLR
40.0000 mg | Freq: Four times a day (QID) | INTRAMUSCULAR | Status: AC
  Administered 2020-09-10 – 2020-09-11 (×4): 40 mg via INTRAVENOUS
  Filled 2020-09-10 (×4): qty 1

## 2020-09-10 MED ORDER — ALBUTEROL SULFATE (2.5 MG/3ML) 0.083% IN NEBU: 2.5000 mg | INHALATION_SOLUTION | RESPIRATORY_TRACT | Status: DC | PRN

## 2020-09-10 MED ORDER — PAROXETINE HCL 20 MG PO TABS
20.0000 mg | ORAL_TABLET | Freq: Every day | ORAL | Status: DC
  Administered 2020-09-10 – 2020-09-15 (×6): 20 mg via ORAL
  Filled 2020-09-10 (×6): qty 1

## 2020-09-10 NOTE — ED Notes (Signed)
Attempted to give  report to the floor but nurse is currently busy with a pt. Call back in 15-20 minutes per nurse request .

## 2020-09-10 NOTE — ED Provider Notes (Signed)
[facility] Emergency Department Provider Note MRN:  [id number]  Arrival date & time: 09/10/20     Chief Complaint   Respiratory Distress   History of Present Illness   [name] is a 74 y.o. year-old female with a history of CHF, COPD, breast cancer presenting to the ED with chief complaint of respiratory distress.  Gradual onset worsening shortness of breath over the past 2 or 3 days, much worse this early morning.  Endorsing right-sided sharp chest pain as well.  Given nitro tabs by EMS.  Denies abdominal pain, no headache or vision change, no fever cough.  Symptoms constant, moderate to severe, no exacerbating or alleviating factors.  Review of Systems  A complete 10 system review of systems was obtained and all systems are negative except as noted in the HPI and PMH.   Patient's Health History    Past Medical History:  Diagnosis Date  . Bilateral leg cramps   . Bladder neoplasm   . Cancer ([place])   . CHF (congestive heart failure) ([place])   . Chronic deep vein thrombosis (DVT) of right lower extremity ([place])    05/ 2018  chronic non-occlusive DVT RLE  . COPD, frequent exacerbations ([place])    03-06-2018 per pt last exacerbation 12/ 2018  . Coronary artery disease    cardiologist-  dr Aundra Dubin-- 03-11-2018 er cath , 80% stenosis in the ostial second diagonal  . Dyspnea on minimal exertion   . Emphysema/COPD ([place])    CAT score- 17  . Family history of breast cancer   . Family history of colon cancer   . Family history of melanoma   . Family history of ovarian cancer   . Family history of pancreatic cancer   . Fibromyalgia 1995  . GERD (gastroesophageal reflux disease)   . Hiatal hernia   . History of breast cancer   . History of diverticulitis of colon    2002  s/p  sigmoid colectomy  . History of multiple pulmonary nodules    hx RUL nodules x2  s/p right VATS w/ wedge resection 09-11-2005 and 06-14-2011  both  necrotizing granulomatous  inflammation w/ cystic area of necrosis & focal calcification  . History of right breast cancer oncologist-  dr Jana Hakim-- no recurrence   dx 2010--  IDC, Stage IA , ER/PR+,  (pT1c pN0) 11-09-2008 right lumpectomy;  12-18-2008 right simple mastectomy for DCIS margins;  completed chemotherapy 2010 (no radiation) and completed antiestrogen therapy  . Hx of colonic polyps    Dr. Cristina Gong -last study '11  . Hyperlipidemia   . Hypertension   . Hypothyroidism   . 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 Nocturia   . OA (osteoarthritis)    thumbs  . On supplemental oxygen therapy    03-06-2018 per pt uses only at night,  checks O2 sats at home,  stated am sat 89% after moving around average 93-94% with RA  . Osteoporosis   . Peripheral arterial occlusive disease [facility]) vascular-- dr chen/ dr Donzetta Matters   proximal right SFA severe focal stenosis with collaterals from the PFA/  04/ 2012 occluded celiac and SMA arteries with distal reconstitution w/ patent IMA  . Peripheral vascular disease ([place])    chronic DVT RLE,  mesenteric vascular disease  . Right lower lobe pulmonary nodule    Chest CT 08-29-2017  . Stage 3 severe COPD by GOLD classification ([place]) hx frequent exacerbations--   pulmologist-  dr Maryann Alar--  per lov note , dated 12-20-2017, oxyogen 2L is prescribed for use with exertion (but pt only uses mostly at night), O2 sats on RA run the 80s , this day sat 86% RA and with 2L O2 sat 92%  . Varicose veins of leg with swelling    varicose vein surgery - Dr. Aleda Grana  . Wears glasses   . Wears hearing aid in both ears     Past Surgical History:  Procedure Laterality Date  . ABDOMINAL AORTOGRAM N/A 07/11/2020   Procedure: ABDOMINAL AORTOGRAM;  Surgeon: Waynetta Sandy, MD;  Location: [place] [facility];  Service: Cardiovascular;  Laterality: N/A;  . AORTIC ARCH ANGIOGRAPHY N/A 07/11/2020   Procedure: AORTIC ARCH ANGIOGRAPHY;  Surgeon: Waynetta Sandy, MD;  Location: [place] [facility];  Service: Cardiovascular;  Laterality: N/A;  . BREAST EXCISIONAL BIOPSY Left 10/15/2008  . BREAST LUMPECTOMY W/ NEEDLE LOCALIZATION Right 11-09-2008    dr Brantley Stage   [facility]  . CARDIAC CATHETERIZATION  03-12-2011   dr Aundra Dubin   70-80% ostial stenosis in small second diagonal (appears to small for intervention),  dLAD 40-50%,  minimal luminal irregulartieis involoving LCFx and RCA,  LVEF 55%  . CATARACT EXTRACTION W/ INTRAOCULAR LENS  IMPLANT, BILATERAL  2011  . CYSTOSCOPY N/A 03/11/2018   Procedure: CYSTOSCOPY WITH INSTILLATION OF POST OPERATIVE EPIRUBICIN;  Surgeon: Festus Aloe, MD;  Location: [facility];  Service: Urology;  Laterality: N/A;  . LEFT HEART CATH AND CORONARY ANGIOGRAPHY N/A 05/25/2020   Procedure: LEFT HEART CATH AND CORONARY ANGIOGRAPHY;  Surgeon: Troy Sine, MD;  Location: [place] [facility];  Service: Cardiovascular;  Laterality: N/A;  . MASTECTOMY Right   . PARTIAL COLECTOMY  2002   sigmoid and appectomy (diverticulitis)  . PARTIAL MASTECTOMY WITH NEEDLE LOCALIZATION Left 02/23/2013   Procedure:  LEFT PARTIAL MASTECTOMY WITH NEEDLE LOCALIZATION;  Surgeon: Adin Hector, MD;  Location: [place];  Service: General;  Laterality: Left;  . PERIPHERAL VASCULAR INTERVENTION  07/11/2020   Procedure: PERIPHERAL VASCULAR INTERVENTION;  Surgeon: Waynetta Sandy, MD;  Location: [place] [facility];  Service: Cardiovascular;;  . RECONSTRUCTION BREAST W/ LATISSIMUS DORSI FLAP Right 05-30-2009   dr Harlow Mares  [facility]  . REDUCTION MAMMAPLASTY Left   . SIMPLE MASTECTOMY Right 12-28-2008    dr Brantley Stage  [facility]  . TRANSTHORACIC ECHOCARDIOGRAM  07-09-2016  dr Aundra Dubin   ef 55-60%, grade 1 diastoic dysfunction/  mild TR  . TRANSURETHRAL RESECTION OF BLADDER TUMOR N/A 03/11/2018   Procedure: TRANSURETHRAL RESECTION OF BLADDER TUMOR (TURBT) 2-5cm;  Surgeon: Festus Aloe, MD;  Location: [facility];  Service: Urology;  Laterality: N/A;   . UPPER EXTREMITY ANGIOGRAPHY Left 07/11/2020   Procedure: Upper Extremity Angiography;  Surgeon: Waynetta Sandy, MD;  Location: [place] [facility];  Service: Cardiovascular;  Laterality: Left;  Marland Kitchen VAGINAL HYSTERECTOMY  1973  . VIDEO ASSISTED THORACOSCOPY (VATS)/WEDGE RESECTION Right 09-11-2005  &  06-14-2011   dr Fara Boros  [facility]   both RUL    Family History  Problem Relation Age of Onset  . Colon cancer Mother        colon  . COPD Mother        brown lung  . Hypertension Mother   . Diabetes Mother   . Emphysema Mother   . Coronary artery disease Father   . Heart attack Father   . Sudden death Father   . Heart disease Father   . Cancer Sister  throat  . Hypertension Sister   . Coronary artery disease Brother   . Heart attack Brother        early 65s  . Throat cancer Sister   . Ovarian cancer Sister        fallopian tube cancer in her 31s  . Melanoma Sister   . Hypertension Sister   . Pancreatic cancer Sister   . Melanoma Niece        dx in her 52s  . Breast cancer Niece 46    Social History   Socioeconomic History  . Marital status: Married    Spouse name: Not on file  . Number of children: 3  . Years of education: 20  . Highest education level: Not on file  Occupational History  . Occupation: Chiropractor: RETIRED    Comment: retired  Tobacco Use  . Smoking status: Former Smoker    Packs/day: 0.75    Years: 50.00    Pack years: 37.50    Types: Cigarettes    Quit date: 05/18/2016    Years since quitting: 4.3  . Smokeless tobacco: Never Used  Vaping Use  . Vaping Use: Never used  Substance and Sexual Activity  . Alcohol use: Yes    Alcohol/week: 10.0 standard drinks    Types: 10 Cans of beer per week  . Drug use: No  . Sexual activity: Not on file  Other Topics Concern  . Not on file  Social History Narrative   HSG, beauty school. Married '69 -30 yrs/widowed; married '79 - 39yr/divorced; married '87.  2 sons - '70, '74, 1  srtep-son; 1 grandchild. 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 SO - good health.  NO history of abuse.  ACP - full code and all heroic measures.    Social Determinants of Health   Financial Resource Strain:   . Difficulty of Paying Living Expenses: Not on file  Food Insecurity:   . Worried About Charity fundraiser in the Last Year: Not on file  . Ran Out of Food in the Last Year: Not on file  Transportation Needs:   . Lack of Transportation (Medical): Not on file  . Lack of Transportation (Non-Medical): Not on file  Physical Activity:   . Days of Exercise per Week: Not on file  . Minutes of Exercise per Session: Not on file  Stress:   . Feeling of Stress : Not on file  Social Connections:   . Frequency of Communication with Friends and Family: Not on file  . Frequency of Social Gatherings with Friends and Family: Not on file  . Attends Religious Services: Not on file  . Active Member of Clubs or Organizations: Not on file  . Attends Archivist Meetings: Not on file  . Marital Status: Not on file  Intimate Partner Violence:   . Fear of Current or Ex-Partner: Not on file  . Emotionally Abused: Not on file  . Physically Abused: Not on file  . Sexually Abused: Not on file     Physical Exam   Vitals:   09/10/20 0624  BP: (!) 113/52  Pulse: (!) 123  Resp: (!) 26  Temp: 98.8 F (37.1 C)  SpO2: 96%    CONSTITUTIONAL: Chronically ill-appearing, NAD NEURO:  Alert and oriented x 3, no focal deficits EYES:  eyes equal and reactive ENT/NECK:  no LAD, no JVD CARDIO: Regular rate, well-perfused, normal S1 and S2 PULM: Scattered rhonchi/crackles, tachypneic GI/GU:  normal bowel  sounds, non-distended, non-tender MSK/SPINE:  No gross deformities, no edema SKIN:  no rash, atraumatic PSYCH:  Appropriate speech and behavior  *Additional and/or pertinent findings included in MDM below  Diagnostic and Interventional Summary    EKG Interpretation  Date/Time:  Saturday  September 10 2020 06:26:02 EST Ventricular Rate:  122 PR Interval:    QRS Duration: 81 QT Interval:  340 QTC Calculation: 485 R Axis:   68 Text Interpretation: Sinus tachycardia Probable anteroseptal infarct, old Confirmed by Gerlene Fee [id number]) on 09/10/2020 6:35:50 AM      Labs Reviewed  RESP PANEL BY RT-PCR (FLU A&B, COVID) ARPGX2  BRAIN NATRIURETIC PEPTIDE  CBC  COMPREHENSIVE METABOLIC PANEL  TROPONIN I (HIGH SENSITIVITY)    DG Chest Port 1 View  Final Result      Medications  albuterol (VENTOLIN HFA) 108 (90 Base) MCG/ACT inhaler 6 puff (has no administration in time range)  methylPREDNISolone sodium succinate (SOLU-MEDROL) 125 mg/2 mL injection 125 mg (has no administration in time range)     Procedures  /  Critical Care Procedures  ED Course and Medical Decision Making  I have reviewed the triage vital signs, the nursing notes, and pertinent available records from the EMR.  Listed above are laboratory and imaging tests that I personally ordered, reviewed, and interpreted and then considered in my medical decision making (see below for details).  Considering pulmonary edema, COPD exacerbation, pulmonary embolism.  Awaiting chest x-ray, labs, will consider CTA     Chest x-ray with question of multifocal pneumonia versus asymmetric edema.  Patient does not otherwise have a fluid overloaded clinical picture.  However she was just in the hospital and a CT a of her chest at that time was negative for PE.  Suspect she again is experiencing a combination flare or exacerbation of COPD and CHF.  Awaiting labs.  Patient seems to have been corrected back to her home 4 L nasal cannula at this time.  Still seems to have increased work of breathing will need to follow-up labs and reassess her ability to breathe and ambulate.  May need admission.  Signed out to oncoming provider at shift change.  Barth Kirks. Sedonia Small, [place] [email or web address]  Final Clinical Impressions(s) / ED Diagnoses     ICD-10-CM   1. SOB (shortness of breath)  R06.02 DG Chest [facility] 1 View    DG Chest Port 1 View    ED Discharge Orders    None       Discharge Instructions Discussed with and Provided to Patient:   Discharge Instructions   None       Maudie Flakes, MD 09/10/20 [phone]

## 2020-09-10 NOTE — H&P (Signed)
History and Physical        Hospital Admission Note Date: 09/10/2020  Patient name: [name] Medical record number: [id number] Date of birth: [birth year] Age: 74 y.o. Gender: female  PCP: Martinique, [name], MD  Patient coming from: Home   Chief Complaint    Chief Complaint  Patient presents with  . 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 Donzetta Matters) who was recently hospitalized from 10/31-11/7 for acute on chronic hypoxic respiratory failure secondary to a COPD exacerbation as well as a mild systolic heart failure exacerbation who presents today via EMS from home with increasing shortness of breath for the past few days.  When she was discharged from the hospital most recently she was advised to use for liters at rest and increased to 6 LPM on exertion however the patient states she has been remaining at 4 L and has had minimal exertion.  Since discharge she has had worsening orthopnea as well.  Patient called her PCP yesterday with complaints of feeling "terrible "with shortness of breath and complained of vomiting and diarrhea earlier in the week.  However, states that she has chronic diarrhea that she uses antidiarrheals for.  She was advised to go to the ED yesterday but did not wish to go and her Lasix dose was increased for a short period of time from 80 mg to 120 however the patient was unaware of this and did not increase her Lasix dose.  Due to her increasing shortness of breath today she called EMS.  She was given 2 nitroglycerin by EMS and was noted to be hypoxic at 87% on baseline 4 LPM and placed on NRB in route.  Also  complaining of right-sided sharp pain in the midaxillary line with inspiration which she has had in the past.  Denies missing any doses of her medication, denies any increased salt intake.  States that she is had a 4 pound weight gain in the past 1 week and has had slight abdominal distention recently.  Denies any fever.  States that her symptoms are similar to all her previous hospitalizations   ED Course: Afebrile, tachycardic, tachypneic,  on 4 L/min. Notable Labs: BNP 145, troponin 28-> 29, WBC 13.6, Hb 11.5, COVID-19 negative. Notable Imaging: CXR with patchy nodular airspace infiltrates in right mid and both lower lungs. Patient received Solu-Medrol and albuterol.    Vitals:   09/10/20 1124 09/10/20 1126  BP: (!) 105/50 (!) 105/50  Pulse: 89 85  Resp: 17 (!) 23  Temp:    SpO2:  95%     Review of Systems:  Review of Systems  All other systems reviewed and are negative.   Medical/Social/Family History   Past Medical History: Past Medical History:  Diagnosis Date  . Bilateral leg cramps   . Bladder neoplasm   . Cancer ([place])   . CHF (congestive heart failure) ([place])   . Chronic deep vein thrombosis (DVT) of right lower extremity ([place])  05/ 2018  chronic non-occlusive DVT RLE  . COPD, frequent exacerbations ([place])    03-06-2018 per pt last exacerbation 12/ 2018  . Coronary artery disease    cardiologist-  dr Aundra Dubin-- 03-11-2018 er cath , 80% stenosis in the ostial second diagonal  . Dyspnea on minimal exertion   . Emphysema/COPD ([place])    CAT score- 17  . Family history of breast cancer   . Family history of colon cancer   . Family history of melanoma   . Family history of ovarian cancer   . Family history of pancreatic cancer   . Fibromyalgia 1995  . GERD (gastroesophageal reflux disease)   . Hiatal hernia   . History of breast cancer   . History of diverticulitis of colon    2002  s/p  sigmoid colectomy  . History of multiple pulmonary nodules    hx RUL nodules x2   s/p right VATS w/ wedge resection 09-11-2005 and 06-14-2011  both  necrotizing granulomatous inflammation w/ cystic area of necrosis & focal calcification  . History of right breast cancer oncologist-  dr Jana Hakim-- no recurrence   dx 2010--  IDC, Stage IA , ER/PR+,  (pT1c pN0) 11-09-2008 right lumpectomy;  12-18-2008 right simple mastectomy for DCIS margins;  completed chemotherapy 2010 (no radiation) and completed antiestrogen therapy  . Hx of colonic polyps    Dr. Cristina Gong -last study '11  . Hyperlipidemia   . Hypertension   . Hypothyroidism   . 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 Nocturia   . OA (osteoarthritis)    thumbs  . On supplemental oxygen therapy    03-06-2018 per pt uses only at night,  checks O2 sats at home,  stated am sat 89% after moving around average 93-94% with RA  . Osteoporosis   . Peripheral arterial occlusive disease [facility]) vascular-- dr chen/ dr Donzetta Matters   proximal right SFA severe focal stenosis with collaterals from the PFA/  04/ 2012 occluded celiac and SMA arteries with distal reconstitution w/ patent IMA  . Peripheral vascular disease ([place])    chronic DVT RLE,  mesenteric vascular disease  . Right lower lobe pulmonary nodule    Chest CT 08-29-2017  . Stage 3 severe COPD by GOLD classification ([place]) hx frequent exacerbations--   pulmologist-  dr Maryann Alar--  per lov note , dated 12-20-2017, oxyogen 2L is prescribed for use with exertion (but pt only uses mostly at night), O2 sats on RA run the 80s , this day sat 86% RA and with 2L O2 sat 92%  . Varicose veins of leg with swelling    varicose vein surgery - Dr. Aleda Grana  . Wears glasses   . Wears hearing aid in both ears     Past Surgical History:  Procedure Laterality Date  . ABDOMINAL AORTOGRAM N/A 07/11/2020   Procedure: ABDOMINAL AORTOGRAM;  Surgeon: Waynetta Sandy, MD;  Location: [place] [facility];  Service: Cardiovascular;  Laterality: N/A;  . AORTIC  ARCH ANGIOGRAPHY N/A 07/11/2020   Procedure: AORTIC ARCH ANGIOGRAPHY;  Surgeon: Waynetta Sandy, MD;  Location: [place] [facility];  Service: Cardiovascular;  Laterality: N/A;  . BREAST EXCISIONAL BIOPSY Left 10/15/2008  . BREAST LUMPECTOMY W/ NEEDLE LOCALIZATION Right 11-09-2008    dr Brantley Stage   [facility]  . CARDIAC CATHETERIZATION  03-12-2011   dr Aundra Dubin   70-80% ostial stenosis in small second diagonal (appears to small for intervention),  dLAD 40-50%,  minimal  luminal irregulartieis involoving LCFx and RCA,  LVEF 55%  . CATARACT EXTRACTION W/ INTRAOCULAR LENS  IMPLANT, BILATERAL  2011  . CYSTOSCOPY N/A 03/11/2018   Procedure: CYSTOSCOPY WITH INSTILLATION OF POST OPERATIVE EPIRUBICIN;  Surgeon: Festus Aloe, MD;  Location: [facility];  Service: Urology;  Laterality: N/A;  . LEFT HEART CATH AND CORONARY ANGIOGRAPHY N/A 05/25/2020   Procedure: LEFT HEART CATH AND CORONARY ANGIOGRAPHY;  Surgeon: Troy Sine, MD;  Location: [place] [facility];  Service: Cardiovascular;  Laterality: N/A;  . MASTECTOMY Right   . PARTIAL COLECTOMY  2002   sigmoid and appectomy (diverticulitis)  . PARTIAL MASTECTOMY WITH NEEDLE LOCALIZATION Left 02/23/2013   Procedure:  LEFT PARTIAL MASTECTOMY WITH NEEDLE LOCALIZATION;  Surgeon: Adin Hector, MD;  Location: [place];  Service: General;  Laterality: Left;  . PERIPHERAL VASCULAR INTERVENTION  07/11/2020   Procedure: PERIPHERAL VASCULAR INTERVENTION;  Surgeon: Waynetta Sandy, MD;  Location: [place] [facility];  Service: Cardiovascular;;  . RECONSTRUCTION BREAST W/ LATISSIMUS DORSI FLAP Right 05-30-2009   dr Harlow Mares  [facility]  . REDUCTION MAMMAPLASTY Left   . SIMPLE MASTECTOMY Right 12-28-2008    dr Brantley Stage  [facility]  . TRANSTHORACIC ECHOCARDIOGRAM  07-09-2016  dr Aundra Dubin   ef 55-60%, grade 1 diastoic dysfunction/  mild TR  . TRANSURETHRAL RESECTION OF BLADDER TUMOR N/A 03/11/2018   Procedure: TRANSURETHRAL RESECTION OF BLADDER TUMOR (TURBT)  2-5cm;  Surgeon: Festus Aloe, MD;  Location: [facility];  Service: Urology;  Laterality: N/A;  . UPPER EXTREMITY ANGIOGRAPHY Left 07/11/2020   Procedure: Upper Extremity Angiography;  Surgeon: Waynetta Sandy, MD;  Location: [place] [facility];  Service: Cardiovascular;  Laterality: Left;  Marland Kitchen VAGINAL HYSTERECTOMY  1973  . VIDEO ASSISTED THORACOSCOPY (VATS)/WEDGE RESECTION Right 09-11-2005  &  06-14-2011   dr Fara Boros  [facility]   both RUL    Medications: Prior to Admission medications   Medication Sig Start Date End Date Taking? Authorizing Provider  acetaminophen (TYLENOL) 325 MG tablet Take 2 tablets (650 mg total) by mouth every 6 (six) hours as needed for mild pain (or Fever >/= 101). 08/21/20  Yes Cherene Altes, MD  albuterol (VENTOLIN HFA) 108 (90 Base) MCG/ACT inhaler TAKE 2 PUFFS BY MOUTH EVERY 6 HOURS AS NEEDED FOR WHEEZE OR SHORTNESS OF BREATH Patient taking differently: Inhale 2 puffs into the lungs every 6 (six) hours as needed for wheezing.  09/05/20  Yes Martyn Ehrich, NP  aspirin EC 81 MG tablet Take 1 tablet (81 mg total) by mouth daily. 08/21/20  Yes Cherene Altes, MD  atorvastatin (LIPITOR) 80 MG tablet TAKE 1 TABLET BY MOUTH EVERY DAY Patient taking differently: Take 80 mg by mouth daily.  03/23/20  Yes [name], NP  benzonatate (TESSALON PERLES) 100 MG capsule Take 1 capsule (100 mg total) by mouth 3 (three) times daily as needed for cough. 05/29/20 05/29/21 Yes Shelly Coss, MD  Biotin 10000 MCG TABS Take 10,000 mcg by mouth daily.   Yes [provider]  cilostazol (PLETAL) 100 MG tablet Take 1 tablet (100 mg total) by mouth 2 (two) times daily. 11/23/19  Yes [name], NP  clopidogrel (PLAVIX) 75 MG tablet Take 1 tablet (75 mg total) by mouth daily. 07/11/20 07/11/21 Yes Waynetta Sandy, MD  dextromethorphan-guaiFENesin [facility] DM) 30-600 MG 12hr tablet Take 1 tablet by mouth 2 (two) times daily as needed for cough.    Yes  [provider]  famotidine (PEPCID)  40 MG tablet Take 1 tablet (40 mg total) by mouth at bedtime. 05/29/20  Yes Shelly Coss, MD  fluticasone (FLONASE) 50 MCG/ACT nasal spray USE 1 SPRAY INTO EACH NOSTRIL TWICE A DAY Patient taking differently: Place 2 sprays into both nostrils in the morning and at bedtime.  06/01/20  Yes Martin[place], [name], MD  Fluticasone-Umeclidin-Vilant (TRELEGY ELLIPTA) 200-62.5-25 MCG/INH AEPB Inhale 1 puff into the lungs daily. Patient taking differently: Inhale 1 puff into the lungs 2 (two) times daily.  04/25/20  Yes Martyn Ehrich, NP  furosemide (LASIX) 40 MG tablet Take 1 tablet (40 mg total) by mouth 2 (two) times daily. 08/21/20  Yes Cherene Altes, MD  ipratropium-albuterol (DUONEB) 0.5-2.5 (3) MG/3ML SOLN Inhale 3 mLs into the lungs every 6 (six) hours as needed (sob, wheezing). 04/25/20  Yes Martyn Ehrich, NP  levothyroxine (SYNTHROID) 112 MCG tablet TAKE 1 TABLET (112 MCG TOTAL) BY MOUTH DAILY BEFORE BREAKFAST. 06/21/20  Yes Martinique, [name], MD  LORazepam (ATIVAN) 2 MG tablet Take 1 tablet (2 mg total) by mouth at bedtime. 08/27/20  Yes Martin[place], [name], MD  montelukast (SINGULAIR) 10 MG tablet Take 1 tablet (10 mg total) by mouth at bedtime. 04/29/20  Yes Martyn Ehrich, NP  pantoprazole (PROTONIX) 40 MG tablet Take 1 tablet (40 mg total) by mouth daily. 05/29/20  Yes Adhikari, Tamsen Meek, MD  PARoxetine (PAXIL) 20 MG tablet TAKE 1 TABLET BY MOUTH EVERYDAY AT BEDTIME Patient taking differently: Take 20 mg by mouth daily. TAKE 1 TABLET BY MOUTH EVERYDAY AT BEDTIME 08/29/20  Yes [place], [name], MD  potassium chloride SA (KLOR-CON) 20 MEQ tablet Take 2 tablets (40 mEq total) by mouth 2 (two) times daily. 08/21/20  Yes Cherene Altes, MD  traZODone (DESYREL) 100 MG tablet TAKE 1 TABLET BY MOUTH EVERYDAY AT BEDTIME Patient taking differently: Take 100 mg by mouth at bedtime.  06/27/20  Yes [place], [name], MD    Allergies:   Allergies  Allergen  Reactions  . Sulfa Antibiotics Swelling    Social History:  reports that she quit smoking about 4 years ago. Her smoking use included cigarettes. She has a 37.50 pack-year smoking history. She has never used smokeless tobacco. She reports current alcohol use of about 10.0 standard drinks of alcohol per week. She reports that she does not use drugs.  Family History: Family History  Problem Relation Age of Onset  . Colon cancer Mother        colon  . COPD Mother        brown lung  . Hypertension Mother   . Diabetes Mother   . Emphysema Mother   . Coronary artery disease Father   . Heart attack Father   . Sudden death Father   . Heart disease Father   . Cancer Sister        throat  . Hypertension Sister   . Coronary artery disease Brother   . Heart attack Brother        early 68s  . Throat cancer Sister   . Ovarian cancer Sister        fallopian tube cancer in her 51s  . Melanoma Sister   . Hypertension Sister   . Pancreatic cancer Sister   . Melanoma Niece        dx in her 89s  . Breast cancer Niece 26     Objective   Physical Exam: Blood pressure (!) 105/50, pulse 85, temperature 98.2 F (36.8  C), temperature source Oral, resp. rate (!) 23, height 5\' 6"  (1.676 m), weight 62.6 kg, SpO2 95 %.  Physical Exam Vitals and nursing note reviewed. Exam conducted with a chaperone present.  HENT:     Head: Normocephalic.     Mouth/Throat:     Mouth: Mucous membranes are dry.  Eyes:     Conjunctiva/sclera: Conjunctivae normal.  Cardiovascular:     Rate and Rhythm: Normal rate and regular rhythm.  Pulmonary:     Breath sounds: No stridor. Wheezing present.     Comments: Mild productive coughing fits Abdominal:     General: Abdomen is flat. There is no distension.  Musculoskeletal:        General: No swelling or tenderness.  Neurological:     Mental Status: She is alert. Mental status is at baseline.  Psychiatric:        Mood and Affect: Mood normal.         Behavior: Behavior normal.     LABS on Admission: I have personally reviewed all the labs and imaging below    Basic Metabolic Panel: Recent Labs  Lab 09/10/20 0628  NA 135  K 4.2  CL 98  CO2 27  GLUCOSE 117*  BUN 14  CREATININE 0.94  CALCIUM 8.9   Liver Function Tests: Recent Labs  Lab 09/10/20 0628  AST 27  ALT 24  ALKPHOS 69  BILITOT 0.8  PROT 7.3  ALBUMIN 3.4*   No results for input(s): LIPASE, AMYLASE in the last 168 hours. No results for input(s): AMMONIA in the last 168 hours. CBC: Recent Labs  Lab 09/10/20 0628  WBC 13.6*  HGB 11.5*  HCT 34.4*  MCV 90.8  PLT 314   Cardiac Enzymes: No results for input(s): CKTOTAL, CKMB, CKMBINDEX, TROPONINI in the last 168 hours. BNP: Invalid input(s): POCBNP CBG: No results for input(s): GLUCAP in the last 168 hours.  Radiological Exams on Admission:  DG Chest Port 1 View  Result Date: 09/10/2020 CLINICAL DATA:  Increasing shortness of breath over couple of days. History of CHF. EXAM: PORTABLE CHEST 1 VIEW COMPARISON:  08/14/2020 FINDINGS: Heart size and pulmonary vascularity are normal. Patchy nodular airspace infiltrates in the right mid and both lower lungs. This may indicate multifocal pneumonia or possibly asymmetrical edema. Linear scarring in the right apex. Postoperative changes in the right breast and chest wall. Calcification of the aorta. IMPRESSION: Patchy nodular airspace infiltrates in the right mid and both lower lungs. Electronically Signed   By: Lucienne Capers M.D.   On: 09/10/2020 06:38      EKG: Independently reviewed   A & P   Principal Problem:   COPD exacerbation ([place]) Active Problems:   Hypothyroidism   Chronic mesenteric ischemia (HCC)   CAD (coronary artery disease)   Acute on chronic respiratory failure with hypoxia ([place])   1. Acute on chronic hypoxic respiratory failure, multifactorial: COPD exacerbation and suspected mild heart failure exacerbation a. satting in the 80s  on baseline 4 L at home requiring NRB and seems to have improved baseline O2 status with steroids and nebs since being in the hospital b. Episode is similar to prior c. Continue steroids and nebulizers d. Dulera and Incruse Ellipta e. Given her new leukocytosis and questionable infiltrate on CXR will start on doxycycline f. Lasix and heart failure protocol  2. COPD a. Consider pulmonary rehab at discharge b. Should have close follow-up with pulmonology at discharge  3. Mild heart failure exacerbation a. BNP minimally elevated  but with increased weight gain and orthopnea b. Lasix 60 mg IV twice daily c. Monitor intake/output and daily weights  4. CAD a. Continue aspirin and statin  5. Chronic mesenteric ischemia a. Continue Plavix and Pletal  6. Hypothyroidism a. Continue Synthroid   DVT prophylaxis: Lovenox   Code Status: Prior  Diet: Heart healthy Family Communication: Admission, patients condition and plan of care including tests being ordered have been discussed with the patient who indicates understanding and agrees with the plan and Code Status.  Disposition Plan: The appropriate patient status for this patient is INPATIENT. Inpatient status is judged to be reasonable and necessary in order to provide the required intensity of service to ensure the patient's safety. The patient's presenting symptoms, physical exam findings, and initial radiographic and laboratory data in the context of their chronic comorbidities is felt to place them at high risk for further clinical deterioration. Furthermore, it is not anticipated that the patient will be medically stable for discharge from the hospital within 2 midnights of admission. The following factors support the patient status of inpatient.   " The patient's presenting symptoms include shortness of breath, wheezing. " The worrisome physical exam findings include shortness of breath and wheezing. " The initial radiographic and  laboratory data are worrisome because of possible developing pneumonia. " The chronic co-morbidities include COPD and heart failure.   * I certify that at the point of admission it is my clinical judgment that the patient will require inpatient hospital care spanning beyond 2 midnights from the point of admission due to high intensity of service, high risk for further deterioration and high frequency of surveillance required.*  Consultants  . None  Procedures  . None  Time Spent on Admission: 66 minutes    Harold Hedge, DO [facility]  09/10/2020, 11:30 AM

## 2020-09-10 NOTE — ED Notes (Signed)
ED TO INPATIENT HANDOFF REPORT  ED Nurse Name and Phone #: [phone]  S Name/Age/Gender [name] 74 y.o. female Room/Bed: WA16/WA16  Code Status   Code Status: Prior  Home/SNF/Other Home Patient oriented to: self, place, time and situation Is this baseline? Yes   Triage Complete: Triage complete  Chief Complaint COPD exacerbation [facility]) [J44.1]  Triage Note Pt arrives EMS from home. SHOB increasing over last couple days. Hx CHF. Increased lasix from 40mg  to 80 mg. Similar experience on 10/31. 2 nitroglycerine given by EMS. 4L [place] at baseline and found at 87%. Placed on NRB and oxygen at 99%.     Allergies Allergies  Allergen Reactions  . Sulfa Antibiotics Swelling    Level of Care/Admitting Diagnosis ED Disposition    ED Disposition Condition Comment   Admit  Hospital Area: [facility] [[id number]]  Level of Care: Med-Surg [16]  May admit patient to [facility] or [facility] if equivalent level of care is available:: Yes  Covid Evaluation: Confirmed COVID Negative  Diagnosis: COPD exacerbation [facility]) [[id number]]  Admitting Physician: Harold Hedge [[id number]]  Attending Physician: Harold Hedge [[id number]]  Estimated length of stay: past midnight tomorrow  Certification:: I certify this patient will need inpatient services for at least 2 midnights       B Medical/Surgery History Past Medical History:  Diagnosis Date  . Bilateral leg cramps   . Bladder neoplasm   . Cancer ([place])   . CHF (congestive heart failure) ([place])   . Chronic deep vein thrombosis (DVT) of right lower extremity ([place])    05/ 2018  chronic non-occlusive DVT RLE  . COPD, frequent exacerbations ([place])    03-06-2018 per pt last exacerbation 12/ 2018  . Coronary artery disease    cardiologist-  dr Aundra Dubin-- 03-11-2018 er cath , 80% stenosis in the ostial second diagonal  . Dyspnea on minimal exertion   . Emphysema/COPD ([place])    CAT score- 17  . Family history of breast cancer    . Family history of colon cancer   . Family history of melanoma   . Family history of ovarian cancer   . Family history of pancreatic cancer   . Fibromyalgia 1995  . GERD (gastroesophageal reflux disease)   . Hiatal hernia   . History of breast cancer   . History of diverticulitis of colon    2002  s/p  sigmoid colectomy  . History of multiple pulmonary nodules    hx RUL nodules x2  s/p right VATS w/ wedge resection 09-11-2005 and 06-14-2011  both  necrotizing granulomatous inflammation w/ cystic area of necrosis & focal calcification  . History of right breast cancer oncologist-  dr Jana Hakim-- no recurrence   dx 2010--  IDC, Stage IA , ER/PR+,  (pT1c pN0) 11-09-2008 right lumpectomy;  12-18-2008 right simple mastectomy for DCIS margins;  completed chemotherapy 2010 (no radiation) and completed antiestrogen therapy  . Hx of colonic polyps    Dr. Cristina Gong -last study '11  . Hyperlipidemia   . Hypertension   . Hypothyroidism   . 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 Nocturia   . OA (osteoarthritis)    thumbs  . On supplemental oxygen therapy    03-06-2018 per pt uses only at night,  checks O2 sats at home,  stated am sat 89% after moving around average 93-94% with RA  . Osteoporosis   . Peripheral arterial occlusive disease ([place]) vascular--  dr chen/ dr Donzetta Matters   proximal right SFA severe focal stenosis with collaterals from the PFA/  04/ 2012 occluded celiac and SMA arteries with distal reconstitution w/ patent IMA  . Peripheral vascular disease ([place])    chronic DVT RLE,  mesenteric vascular disease  . Right lower lobe pulmonary nodule    Chest CT 08-29-2017  . Stage 3 severe COPD by GOLD classification ([place]) hx frequent exacerbations--   pulmologist-  dr Maryann Alar--  per lov note , dated 12-20-2017, oxyogen 2L is prescribed for use with exertion (but pt only uses mostly at night), O2 sats on RA run the 80s , this day sat 86% RA and with 2L O2 sat 92%   . Varicose veins of leg with swelling    varicose vein surgery - Dr. Aleda Grana  . Wears glasses   . Wears hearing aid in both ears    Past Surgical History:  Procedure Laterality Date  . ABDOMINAL AORTOGRAM N/A 07/11/2020   Procedure: ABDOMINAL AORTOGRAM;  Surgeon: Waynetta Sandy, MD;  Location: [place] [facility];  Service: Cardiovascular;  Laterality: N/A;  . AORTIC ARCH ANGIOGRAPHY N/A 07/11/2020   Procedure: AORTIC ARCH ANGIOGRAPHY;  Surgeon: Waynetta Sandy, MD;  Location: [place] [facility];  Service: Cardiovascular;  Laterality: N/A;  . BREAST EXCISIONAL BIOPSY Left 10/15/2008  . BREAST LUMPECTOMY W/ NEEDLE LOCALIZATION Right 11-09-2008    dr Brantley Stage   [facility]  . CARDIAC CATHETERIZATION  03-12-2011   dr Aundra Dubin   70-80% ostial stenosis in small second diagonal (appears to small for intervention),  dLAD 40-50%,  minimal luminal irregulartieis involoving LCFx and RCA,  LVEF 55%  . CATARACT EXTRACTION W/ INTRAOCULAR LENS  IMPLANT, BILATERAL  2011  . CYSTOSCOPY N/A 03/11/2018   Procedure: CYSTOSCOPY WITH INSTILLATION OF POST OPERATIVE EPIRUBICIN;  Surgeon: Festus Aloe, MD;  Location: [facility];  Service: Urology;  Laterality: N/A;  . LEFT HEART CATH AND CORONARY ANGIOGRAPHY N/A 05/25/2020   Procedure: LEFT HEART CATH AND CORONARY ANGIOGRAPHY;  Surgeon: Troy Sine, MD;  Location: [place] [facility];  Service: Cardiovascular;  Laterality: N/A;  . MASTECTOMY Right   . PARTIAL COLECTOMY  2002   sigmoid and appectomy (diverticulitis)  . PARTIAL MASTECTOMY WITH NEEDLE LOCALIZATION Left 02/23/2013   Procedure:  LEFT PARTIAL MASTECTOMY WITH NEEDLE LOCALIZATION;  Surgeon: Adin Hector, MD;  Location: [place];  Service: General;  Laterality: Left;  . PERIPHERAL VASCULAR INTERVENTION  07/11/2020   Procedure: PERIPHERAL VASCULAR INTERVENTION;  Surgeon: Waynetta Sandy, MD;  Location: [place] [facility];  Service: Cardiovascular;;  .  RECONSTRUCTION BREAST W/ LATISSIMUS DORSI FLAP Right 05-30-2009   dr Harlow Mares  [facility]  . REDUCTION MAMMAPLASTY Left   . SIMPLE MASTECTOMY Right 12-28-2008    dr Brantley Stage  [facility]  . TRANSTHORACIC ECHOCARDIOGRAM  07-09-2016  dr Aundra Dubin   ef 55-60%, grade 1 diastoic dysfunction/  mild TR  . TRANSURETHRAL RESECTION OF BLADDER TUMOR N/A 03/11/2018   Procedure: TRANSURETHRAL RESECTION OF BLADDER TUMOR (TURBT) 2-5cm;  Surgeon: Festus Aloe, MD;  [facility];  Service: Urology;  Laterality: N/A;  . UPPER EXTREMITY ANGIOGRAPHY Left 07/11/2020   Procedure: Upper Extremity Angiography;  Surgeon: Waynetta Sandy, MD;  Location: [place] [facility];  Service: Cardiovascular;  Laterality: Left;  Marland Kitchen VAGINAL HYSTERECTOMY  1973  . VIDEO ASSISTED THORACOSCOPY (VATS)/WEDGE RESECTION Right 09-11-2005  &  06-14-2011   dr hendrickso  [facility]   both RUL     A IV  Location/Drains/Wounds Patient Lines/Drains/Airways Status    Active Line/Drains/Airways    Name Placement date Placement time Site Days   Peripheral IV 09/10/20 Left Forearm 09/10/20  0841  Forearm  less than 1          Intake/Output Last 24 hours No intake or output data in the 24 hours ending 09/10/20 1136  Labs/Imaging Results for orders placed or performed during the hospital encounter of 09/10/20 (from the past 48 hour(s))  Brain natriuretic peptide     Status: Abnormal   Collection Time: 09/10/20  6:28 AM  Result Value Ref Range   B Natriuretic Peptide 145.4 (H) 0.0 - 100.0 pg/mL    Comment: Performed at [facility], [place] [address] [place], [place] [address]  Troponin I (High Sensitivity)     Status: Abnormal   Collection Time: 09/10/20  6:28 AM  Result Value Ref Range   Troponin I (High Sensitivity) 28 (H) <18 ng/L    Comment: (NOTE) Elevated high sensitivity troponin I (hsTnI) values and significant  changes across serial measurements may suggest ACS but many other  chronic and acute conditions are known  to elevate hsTnI results.  Refer to the "Links" section for chest pain algorithms and additional  guidance. Performed at [facility], [place] [address], [place], [place] [address]   CBC     Status: Abnormal   Collection Time: 09/10/20  6:28 AM  Result Value Ref Range   WBC 13.6 (H) 4.0 - 10.5 K/uL   RBC 3.79 (L) 3.87 - 5.11 MIL/uL   Hemoglobin 11.5 (L) 12.0 - 15.0 g/dL   HCT 34.4 (L) 36 - 46 %   MCV 90.8 80.0 - 100.0 fL   MCH 30.3 26.0 - 34.0 pg   MCHC 33.4 30.0 - 36.0 g/dL   RDW 13.9 11.5 - 15.5 %   Platelets 314 150 - 400 K/uL   nRBC 0.0 0.0 - 0.2 %    Comment: Performed at [facility], [place] [address], [place], [place] [address]  Comprehensive metabolic panel     Status: Abnormal   Collection Time: 09/10/20  6:28 AM  Result Value Ref Range   Sodium 135 135 - 145 mmol/L   Potassium 4.2 3.5 - 5.1 mmol/L   Chloride 98 98 - 111 mmol/L   CO2 27 22 - 32 mmol/L   Glucose, Bld 117 (H) 70 - 99 mg/dL    Comment: Glucose reference range applies only to samples taken after fasting for at least 8 hours.   BUN 14 8 - 23 mg/dL   Creatinine, Ser 0.94 0.44 - 1.00 mg/dL   Calcium 8.9 8.9 - 10.3 mg/dL   Total Protein 7.3 6.5 - 8.1 g/dL   Albumin 3.4 (L) 3.5 - 5.0 g/dL   AST 27 15 - 41 U/L   ALT 24 0 - 44 U/L   Alkaline Phosphatase 69 38 - 126 U/L   Total Bilirubin 0.8 0.3 - 1.2 mg/dL   GFR, Estimated >60 >60 mL/min    Comment: (NOTE) Calculated using the CKD-EPI Creatinine Equation (2021)    Anion gap 10 5 - 15    Comment: Performed at [facility], [place] [address], [place], [place] [address]  Resp Panel by RT-PCR (Flu A&B, Covid) Nasopharyngeal Swab     Status: None   Collection Time: 09/10/20  6:29 AM   Specimen: Nasopharyngeal Swab; Nasopharyngeal(NP) swabs in vial transport medium  Result Value Ref Range   SARS  Coronavirus 2 by RT PCR NEGATIVE NEGATIVE    Comment: (NOTE) SARS-CoV-2 target nucleic acids are NOT  DETECTED.  The SARS-CoV-2 RNA is generally detectable in upper respiratory specimens during the acute phase of infection. The lowest concentration of SARS-CoV-2 viral copies this assay can detect is 138 copies/mL. A negative result does not preclude SARS-Cov-2 infection and should not be used as the sole basis for treatment or other patient management decisions. A negative result may occur with  improper specimen collection/handling, submission of specimen other than nasopharyngeal swab, presence of viral mutation(s) within the areas targeted by this assay, and inadequate number of viral copies(<138 copies/mL). A negative result must be combined with clinical observations, patient history, and epidemiological information. The expected result is Negative.  Fact Sheet for Patients:  EntrepreneurPulse.com.au  Fact Sheet for Healthcare Providers:  IncredibleEmployment.be  This test is no t yet approved or cleared by the [place] FDA and  has been authorized for detection and/or diagnosis of SARS-CoV-2 by FDA under an Emergency Use Authorization (EUA). This EUA will remain  in effect (meaning this test can be used) for the duration of the COVID-19 declaration under Section 564(b)(1) of the Act, 21 U.S.C.section 360bbb-3(b)(1), unless the authorization is terminated  or revoked sooner.       Influenza A by PCR NEGATIVE NEGATIVE   Influenza B by PCR NEGATIVE NEGATIVE    Comment: (NOTE) The Xpert Xpress SARS-CoV-2/FLU/RSV plus assay is intended as an aid in the diagnosis of influenza from Nasopharyngeal swab specimens and should not be used as a sole basis for treatment. Nasal washings and aspirates are unacceptable for Xpert Xpress SARS-CoV-2/FLU/RSV testing.  Fact Sheet for Patients: EntrepreneurPulse.com.au  Fact Sheet for Healthcare Providers: IncredibleEmployment.be  This test is not yet approved or  cleared by the [place] FDA and has been authorized for detection and/or diagnosis of SARS-CoV-2 by FDA under an Emergency Use Authorization (EUA). This EUA will remain in effect (meaning this test can be used) for the duration of the COVID-19 declaration under Section 564(b)(1) of the Act, 21 U.S.C. section 360bbb-3(b)(1), unless the authorization is terminated or revoked.  Performed at [facility], [place] [address] [place], [place] [address]   Troponin I (High Sensitivity)     Status: Abnormal   Collection Time: 09/10/20  8:40 AM  Result Value Ref Range   Troponin I (High Sensitivity) 29 (H) <18 ng/L    Comment: (NOTE) Elevated high sensitivity troponin I (hsTnI) values and significant  changes across serial measurements may suggest ACS but many other  chronic and acute conditions are known to elevate hsTnI results.  Refer to the "Links" section for chest pain algorithms and additional  guidance. Performed at [facility], [place] [address], [place], [place] [address]    DG Chest Port 1 View  Result Date: 09/10/2020 CLINICAL DATA:  Increasing shortness of breath over couple of days. History of CHF. EXAM: PORTABLE CHEST 1 VIEW COMPARISON:  08/14/2020 FINDINGS: Heart size and pulmonary vascularity are normal. Patchy nodular airspace infiltrates in the right mid and both lower lungs. This may indicate multifocal pneumonia or possibly asymmetrical edema. Linear scarring in the right apex. Postoperative changes in the right breast and chest wall. Calcification of the aorta. IMPRESSION: Patchy nodular airspace infiltrates in the right mid and both lower lungs. Electronically Signed   By: Lucienne Capers M.D.   On: 09/10/2020 06:38    Pending Labs Unresulted Labs (From admission, onward)  Start     Ordered   Education officer, environmental  Daily,   R      Signed and Held   Signed and Held  CBC  Daily,   R      Signed and Held    Signed and Held  Creatinine, serum  (enoxaparin (LOVENOX)    CrCl >/= 30 ml/min)  Weekly,   R     Comments: while on enoxaparin therapy    Signed and Held          Vitals/Pain Today's Vitals   09/10/20 1003 09/10/20 1055 09/10/20 1124 09/10/20 1126  BP: 125/77  (!) 105/50 (!) 105/50  Pulse: 100  89 85  Resp: 18  17 (!) 23  Temp:      TempSrc:      SpO2: 92% 95%  95%  Weight:      Height:      PainSc:        Isolation Precautions No active isolations  Medications Medications  albuterol (PROVENTIL) (2.5 MG/3ML) 0.083% nebulizer solution 2.5 mg (has no administration in time range)  albuterol (PROVENTIL) (2.5 MG/3ML) 0.083% nebulizer solution 2.5 mg (has no administration in time range)  furosemide (LASIX) injection 60 mg (has no administration in time range)  furosemide (LASIX) injection 60 mg (has no administration in time range)  albuterol (VENTOLIN HFA) 108 (90 Base) MCG/ACT inhaler 6 puff (6 puffs Inhalation Given 09/10/20 0655)  methylPREDNISolone sodium succinate (SOLU-MEDROL) 125 mg/2 mL injection 125 mg (125 mg Intravenous Given 09/10/20 0655)    Mobility manual wheelchair Low fall risk   Focused Assessments .   R Recommendations: See Admitting Provider Note  Report given to:   Additional Notes: n/a

## 2020-09-10 NOTE — ED Triage Notes (Signed)
Pt arrives EMS from home. SHOB increasing over last couple days. Hx CHF. Increased lasix from 40mg  to 80 mg. Similar experience on 10/31. 2 nitroglycerine given by EMS. 4L [place] at baseline and found at 87%. Placed on NRB and oxygen at 99%.

## 2020-09-11 DIAGNOSIS — G4733 Obstructive sleep apnea (adult) (pediatric): Secondary | ICD-10-CM

## 2020-09-11 DIAGNOSIS — R0602 Shortness of breath: Secondary | ICD-10-CM

## 2020-09-11 DIAGNOSIS — I5023 Acute on chronic systolic (congestive) heart failure: Secondary | ICD-10-CM

## 2020-09-11 LAB — CBC
HCT: 32.4 % — ABNORMAL LOW (ref 36.0–46.0)
Hemoglobin: 10.8 g/dL — ABNORMAL LOW (ref 12.0–15.0)
MCH: 30 pg (ref 26.0–34.0)
MCHC: 33.3 g/dL (ref 30.0–36.0)
MCV: 90 fL (ref 80.0–100.0)
Platelets: 321 10*3/uL (ref 150–400)
RBC: 3.6 MIL/uL — ABNORMAL LOW (ref 3.87–5.11)
RDW: 13.4 % (ref 11.5–15.5)
WBC: 9.7 10*3/uL (ref 4.0–10.5)
nRBC: 0 % (ref 0.0–0.2)

## 2020-09-11 LAB — EXPECTORATED SPUTUM ASSESSMENT W GRAM STAIN, RFLX TO RESP C

## 2020-09-11 MED ORDER — ALUM & MAG HYDROXIDE-SIMETH 200-200-20 MG/5ML PO SUSP
30.0000 mL | Freq: Four times a day (QID) | ORAL | Status: DC | PRN
  Administered 2020-09-14: 30 mL via ORAL
  Filled 2020-09-11: qty 30

## 2020-09-11 MED ORDER — IPRATROPIUM-ALBUTEROL 0.5-2.5 (3) MG/3ML IN SOLN
3.0000 mL | Freq: Four times a day (QID) | RESPIRATORY_TRACT | Status: DC
  Administered 2020-09-11 (×2): 3 mL via RESPIRATORY_TRACT
  Filled 2020-09-11 (×2): qty 3

## 2020-09-11 MED ORDER — LIDOCAINE VISCOUS HCL 2 % MT SOLN: 15.0000 mL | Freq: Once | OROMUCOSAL | Status: DC

## 2020-09-11 MED ORDER — ALUM & MAG HYDROXIDE-SIMETH 200-200-20 MG/5ML PO SUSP
30.0000 mL | Freq: Once | ORAL | Status: AC
  Administered 2020-09-11: 30 mL via ORAL

## 2020-09-11 MED ORDER — IPRATROPIUM-ALBUTEROL 0.5-2.5 (3) MG/3ML IN SOLN
3.0000 mL | Freq: Three times a day (TID) | RESPIRATORY_TRACT | Status: DC
  Administered 2020-09-12 – 2020-09-15 (×11): 3 mL via RESPIRATORY_TRACT
  Filled 2020-09-11 (×11): qty 3

## 2020-09-11 MED ORDER — HYDROCODONE-HOMATROPINE 5-1.5 MG/5ML PO SYRP: 5.0000 mL | ORAL_SOLUTION | Freq: Four times a day (QID) | ORAL | Status: DC | PRN

## 2020-09-11 MED ORDER — GUAIFENESIN ER 600 MG PO TB12
1200.0000 mg | ORAL_TABLET | Freq: Two times a day (BID) | ORAL | Status: DC
  Administered 2020-09-11 – 2020-09-15 (×9): 1200 mg via ORAL
  Filled 2020-09-11 (×9): qty 2

## 2020-09-11 MED ORDER — METHYLPREDNISOLONE SODIUM SUCC 125 MG IJ SOLR
60.0000 mg | Freq: Two times a day (BID) | INTRAMUSCULAR | Status: DC
  Administered 2020-09-11 – 2020-09-12 (×3): 60 mg via INTRAVENOUS
  Filled 2020-09-11 (×3): qty 2

## 2020-09-11 MED ORDER — BUDESONIDE 0.5 MG/2ML IN SUSP
0.5000 mg | Freq: Two times a day (BID) | RESPIRATORY_TRACT | Status: DC
  Administered 2020-09-11 – 2020-09-15 (×8): 0.5 mg via RESPIRATORY_TRACT
  Filled 2020-09-11 (×8): qty 2

## 2020-09-11 MED ORDER — PANTOPRAZOLE SODIUM 40 MG PO TBEC
40.0000 mg | DELAYED_RELEASE_TABLET | Freq: Every day | ORAL | Status: DC
  Administered 2020-09-11 – 2020-09-15 (×5): 40 mg via ORAL
  Filled 2020-09-11 (×4): qty 1

## 2020-09-11 MED ORDER — FLUTICASONE PROPIONATE 50 MCG/ACT NA SUSP
2.0000 | Freq: Every day | NASAL | Status: DC
  Administered 2020-09-11 – 2020-09-15 (×5): 2 via NASAL
  Filled 2020-09-11 (×2): qty 16

## 2020-09-11 MED ORDER — LIDOCAINE VISCOUS HCL 2 % MT SOLN: 15.0000 mL | Freq: Four times a day (QID) | OROMUCOSAL | Status: DC | PRN

## 2020-09-11 NOTE — Progress Notes (Signed)
PROGRESS NOTE    [name]  RFF:[id number] DOB: [birth year] DOA: 09/10/2020 PCP: [place], [name], MD    Chief Complaint  Patient presents with  . Respiratory Distress    Brief Narrative:  HPI per Dr. 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 Donzetta Matters) who was recently hospitalized from 10/31-11/7 for acute on chronic hypoxic respiratory failure secondary to a COPD exacerbation as well as a mild systolic heart failure exacerbation who presents today via EMS from home with increasing shortness of breath for the past few days.  When she was discharged from the hospital most recently she was advised to use for liters at rest and increased to 6 LPM on exertion however the patient states she has been remaining at 4 L and has had minimal exertion.  Since discharge she has had worsening orthopnea as well.  Patient called her PCP yesterday with complaints of feeling "terrible "with shortness of breath and complained of vomiting and diarrhea earlier in the week.  However, states that she has chronic diarrhea that she uses antidiarrheals for.  She was advised to go to the ED yesterday but did not wish to go and her Lasix dose was increased for a short period of time from 80 mg to 120 however the patient was unaware of this and did not increase her Lasix dose.  Due to her increasing shortness of breath today she called EMS.  She was given 2 nitroglycerin by EMS and was noted to be hypoxic at 87% on baseline 4 LPM and placed on NRB in route.  Also complaining of right-sided sharp pain in the midaxillary line with inspiration which she has had in the past.  Denies missing any doses of her medication, denies any increased salt intake.  States that she is had a 4 pound weight gain in the  past 1 week and has had slight abdominal distention recently.  Denies any fever.  States that her symptoms are similar to all her previous hospitalizations   ED Course: Afebrile, tachycardic, tachypneic,  on 4 L/min. Notable Labs: BNP 145, troponin 28-> 29, WBC 13.6, Hb 11.5, COVID-19 negative. Notable Imaging: CXR with patchy nodular airspace infiltrates in right mid and both lower lungs. Patient received Solu-Medrol and albuterol.    Assessment & Plan:   Principal Problem:   COPD exacerbation ([place]) Active Problems:   Hypothyroidism   Chronic mesenteric ischemia (HCC)   CAD (coronary artery disease)   Acute on chronic respiratory failure with hypoxia (HCC)  1 acute on chronic respiratory failure with hypoxia multifactorial secondary to acute COPD exacerbation and suspected mild acute systolic CHF exacerbation Patient recently hospitalized for similar episode of COPD exacerbation and acute systolic heart failure.  Patient noted to have increased O2 requirements on presentation.  Patient still with shortness of breath with diffuse wheezing.  Some clinical improvement.  Urine output not properly recorded.  Discontinue oral prednisone and continue IV Solu-Medrol 60 mg every 12 hours.  Discontinue Incruse and Breo and place on Pulmicort, scheduled duo nebs, PPI.  Start Mucinex.  Continue singular, doxycycline, Pepcid.  Supportive care.  Follow.  2.  Acute COPD exacerbation Patient with diffuse wheezing.  Poor to fair air movement.  Discontinue oral prednisone continue IV Solu-Medrol 60 mg every 12 hours, doxycycline, Singulair, Pepcid.  Discontinue Incruse  and Breo and placed on Pulmicort, scheduled duo nebs, PPI, Mucinex.  Will need close outpatient follow-up post discharge with pulmonary.  3.  Mild acute on chronic systolic CHF exacerbation BNP minimally elevated however patient noted to have orthopnea and a 4 pound weight gain per patient.  Urine output not properly recorded.  Current weight  139.5 pounds.  Continue Lasix 60 mg IV every 12 hours.  Continue aspirin, statin, Plavix.  4.  GERD PPI.  Pepcid.  GI cocktail as needed.  Follow.  5.  Coronary artery disease Stable.  Continue aspirin, statin, Plavix, diuretics.  Follow.  6.  Chronic mesenteric ischemia Continue Pletal and Plavix.  7.  Hypothyroidism Synthroid.  8.  OSA Patient recently diagnosed with OSA via sleep study (07/27/2020) however patient stated has not had a follow-up since sleep study date.  Will place on CPAP nightly.  Outpatient follow-up with PCP.   DVT prophylaxis: Lovenox Code Status: DNR Family Communication: Updated patient and husband at bedside. Disposition:   Status is: Inpatient    Dispo: The patient is from: Home              Anticipated d/c is to: Home              Anticipated d/c date is: 3 to 4 days.              Patient currently in acute on chronic respiratory failure, on IV steroids, antibiotics, IV diuretics.  Not stable for discharge.       Consultants:   None  Procedures:   Chest x-ray 09/10/2020    Antimicrobials:  Doxycycline 09/10/2020   Subjective: Patient sitting up on the side of the bed.  Patient states some clinical improvement since admission.  Denies any chest pain.  Still with significant shortness of breath.  Stated had a sleep study done approximately a month ago however does not have the results back yet.  Objective: Vitals:   09/11/20 0407 09/11/20 0456 09/11/20 0500 09/11/20 0754  BP:  111/64    Pulse:  95    Resp:  (!) 21    Temp:  98.5 F (36.9 C)    TempSrc:  Oral    SpO2: 91% 91%  91%  Weight:   63.3 kg   Height:        Intake/Output Summary (Last 24 hours) at 09/11/2020 1113 Last data filed at 09/10/2020 1402 Gross per 24 hour  Intake 240 ml  Output --  Net 240 ml   Filed Weights   09/10/20 0622 09/11/20 0500  Weight: 62.6 kg 63.3 kg    Examination:  General exam: Appears calm and comfortable  Respiratory system:  Diffuse wheezing.  Decreased breath sounds in the bases.  Some scattered crackles.  Poor to fair air movement.   Cardiovascular system: Regular rate rhythm no murmurs rubs or gallops.  No JVD.  No lower extremity edema. Gastrointestinal system: Abdomen is nondistended, soft and nontender. No organomegaly or masses felt. Normal bowel sounds heard. Central nervous system: Alert and oriented. No focal neurological deficits. Extremities: Symmetric 5 x 5 power. Skin: No rashes, lesions or ulcers Psychiatry: Judgement and insight appear normal. Mood & affect appropriate.     Data Reviewed: I have personally reviewed following labs and imaging studies  CBC: Recent Labs  Lab 09/10/20 0628 09/11/20 0527  WBC 13.6* 9.7  HGB 11.5* 10.8*  HCT 34.4* 32.4*  MCV 90.8 90.0  PLT 314 242    Basic Metabolic Panel: Recent  Labs  Lab 09/10/20 0628  NA 135  K 4.2  CL 98  CO2 27  GLUCOSE 117*  BUN 14  CREATININE 0.94  CALCIUM 8.9    GFR: Estimated Creatinine Clearance: 48.4 mL/min (by C-G formula based on SCr of 0.94 mg/dL).  Liver Function Tests: Recent Labs  Lab 09/10/20 0628  AST 27  ALT 24  ALKPHOS 69  BILITOT 0.8  PROT 7.3  ALBUMIN 3.4*    CBG: No results for input(s): GLUCAP in the last 168 hours.   Recent Results (from the past 240 hour(s))  Resp Panel by RT-PCR (Flu A&B, Covid) Nasopharyngeal Swab     Status: None   Collection Time: 09/10/20  6:29 AM   Specimen: Nasopharyngeal Swab; Nasopharyngeal(NP) swabs in vial transport medium  Result Value Ref Range Status   SARS Coronavirus 2 by RT PCR NEGATIVE NEGATIVE Final    Comment: (NOTE) SARS-CoV-2 target nucleic acids are NOT DETECTED.  The SARS-CoV-2 RNA is generally detectable in upper respiratory specimens during the acute phase of infection. The lowest concentration of SARS-CoV-2 viral copies this assay can detect is 138 copies/mL. A negative result does not preclude SARS-Cov-2 infection and should not be used  as the sole basis for treatment or other patient management decisions. A negative result may occur with  improper specimen collection/handling, submission of specimen other than nasopharyngeal swab, presence of viral mutation(s) within the areas targeted by this assay, and inadequate number of viral copies(<138 copies/mL). A negative result must be combined with clinical observations, patient history, and epidemiological information. The expected result is Negative.  Fact Sheet for Patients:  EntrepreneurPulse.com.au  Fact Sheet for Healthcare Providers:  IncredibleEmployment.be  This test is no t yet approved or cleared by the [place] FDA and  has been authorized for detection and/or diagnosis of SARS-CoV-2 by FDA under an Emergency Use Authorization (EUA). This EUA will remain  in effect (meaning this test can be used) for the duration of the COVID-19 declaration under Section 564(b)(1) of the Act, 21 U.S.C.section 360bbb-3(b)(1), unless the authorization is terminated  or revoked sooner.       Influenza A by PCR NEGATIVE NEGATIVE Final   Influenza B by PCR NEGATIVE NEGATIVE Final    Comment: (NOTE) The Xpert Xpress SARS-CoV-2/FLU/RSV plus assay is intended as an aid in the diagnosis of influenza from Nasopharyngeal swab specimens and should not be used as a sole basis for treatment. Nasal washings and aspirates are unacceptable for Xpert Xpress SARS-CoV-2/FLU/RSV testing.  Fact Sheet for Patients: EntrepreneurPulse.com.au  Fact Sheet for Healthcare Providers: IncredibleEmployment.be  This test is not yet approved or cleared by the [place] FDA and has been authorized for detection and/or diagnosis of SARS-CoV-2 by FDA under an Emergency Use Authorization (EUA). This EUA will remain in effect (meaning this test can be used) for the duration of the COVID-19 declaration under Section 564(b)(1)  of the Act, 21 U.S.C. section 360bbb-3(b)(1), unless the authorization is terminated or revoked.  Performed at [facility], [place] [address], [place], [place] [address]          Radiology Studies: [facility] Chest Port 1 View  Result Date: 09/10/2020 CLINICAL DATA:  Increasing shortness of breath over couple of days. History of CHF. EXAM: PORTABLE CHEST 1 VIEW COMPARISON:  08/14/2020 FINDINGS: Heart size and pulmonary vascularity are normal. Patchy nodular airspace infiltrates in the right mid and both lower lungs. This may indicate multifocal pneumonia or possibly asymmetrical edema. Linear scarring in the right  apex. Postoperative changes in the right breast and chest wall. Calcification of the aorta. IMPRESSION: Patchy nodular airspace infiltrates in the right mid and both lower lungs. Electronically Signed   By: Lucienne Capers M.D.   On: 09/10/2020 06:38        Scheduled Meds: . aspirin EC  81 mg Oral Daily  . atorvastatin  80 mg Oral Daily  . budesonide (PULMICORT) nebulizer solution  0.5 mg Nebulization BID  . cilostazol  100 mg Oral BID  . clopidogrel  75 mg Oral Daily  . doxycycline  100 mg Oral Q12H  . enoxaparin (LOVENOX) injection  40 mg Subcutaneous Q24H  . famotidine  40 mg Oral QHS  . fluticasone  2 spray Each Nare Daily  . furosemide  60 mg Intravenous BID  . guaiFENesin  1,200 mg Oral BID  . ipratropium-albuterol  3 mL Nebulization Q6H  . levothyroxine  112 mcg Oral Q0600  . LORazepam  2 mg Oral QHS  . methylPREDNISolone (SOLU-MEDROL) injection  60 mg Intravenous Q12H  . montelukast  10 mg Oral QHS  . pantoprazole  40 mg Oral Q0600  . PARoxetine  20 mg Oral Daily  . potassium chloride SA  40 mEq Oral BID  . traZODone  100 mg Oral QHS   Continuous Infusions:   LOS: 1 day    Time spent: 40 minutes    Irine Seal, MD [facility]   To contact the attending provider between 7A-7P or the covering provider during after hours  7P-7A, please log into the web site [email or web address] and access using universal [place] password for that web site. If you do not have the password, please call the hospital operator.  09/11/2020, 11:13 AM

## 2020-09-11 NOTE — Evaluation (Signed)
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 Donzetta Matters) who was recently hospitalized from 10/31-11/7 for acute on chronic hypoxic respiratory failure secondary to a COPD exacerbation as well as a mild systolic heart failure exacerbation who presents today via EMS from home with increasing shortness of breath for the past few days. admitted for COPD exac, CHF  Clinical Impression  Pt admitted with above diagnosis.   PT quite pleasant, cooperative and motivated. SpO2=87% on RA with amb, returns to 99-100% with O2 replaced at 3L in less than 1 min. Reviewed proper/diaphragmatic breathing with pt and she is able to return demo. Provided pt with warmed shower cap as she was attempting to wash her hair with a wash cloth on PT arrival. Pt appreciative Will follow in acute setting as pt has had 3 admissions in the last 6 mos   Pt currently with functional limitations due to the deficits listed below (see PT Problem List). Pt will benefit from skilled PT to increase their independence and safety with mobility to allow discharge to the venue listed below.       Follow Up Recommendations Home health PT    Equipment Recommendations  Other (comment) (?rollator)    Recommendations for Other Services       Precautions / Restrictions Precautions Precautions: Other (comment) Precaution Comments: O2 dep. Monitor O2 levels Restrictions Weight Bearing Restrictions: No      Mobility  Bed Mobility Overal bed mobility: Independent                  Transfers   Equipment used: None Transfers: Sit to/from Stand Sit to Stand: Modified  independent (Device/Increase time)            Ambulation/Gait Ambulation/Gait assistance: Min guard;Supervision Gait Distance (Feet): 120 Feet Assistive device: None;1 person hand held assist Gait Pattern/deviations: Step-through pattern;Decreased stride length     General Gait Details: intermittent HHA for balance and safety. standing rest after 60' d/t DOE. SpO2= 87% on RAwith ambulation. discussed rollator for energy conservation  Stairs            Wheelchair Mobility    Modified Rankin (Stroke Patients Only)       Balance Overall balance assessment: Needs assistance Sitting-balance support: No upper extremity supported;Feet supported Sitting balance-Leahy Scale: Normal       Standing balance-Leahy Scale: Fair Standing balance comment: Fair to good, not tested to mod perturbations                             Pertinent Vitals/Pain Pain Assessment: No/denies pain    Home Living Family/patient expects to be discharged to:: Private residence Living Arrangements: Spouse/significant other Available Help at Discharge: Family;Available 24 hours/day Type of Home: House       Home Layout: Two level;1/2 bath on main level Home Equipment: Cane - single point;Wheelchair - manual      Prior Function Level of Independence: Needs assistance   Gait / Transfers Assistance Needed: amb with cane recently d/t rapid fatigue/weakness  ADL's / Homemaking Assistance Needed: Reports that self care has become increasingly diffcult due to shortness of breath and resultant limited energy.  Comments: pt reports she is quite active and independent at her baseline     Hand Dominance        Extremity/Trunk Assessment   Upper Extremity Assessment Upper Extremity Assessment: Overall WFL for tasks assessed;Defer to OT evaluation    Lower Extremity Assessment Lower Extremity Assessment: Overall WFL for tasks assessed       Communication      Cognition  Arousal/Alertness: Awake/alert Behavior During Therapy: WFL for tasks assessed/performed Overall Cognitive Status: Within Functional Limits for tasks assessed                                 General Comments: Pleasant, cooperative, A&Ox4      General Comments      Exercises     Assessment/Plan    PT Assessment Patient needs continued PT services  PT Problem List Decreased mobility;Decreased activity tolerance;Cardiopulmonary status limiting activity       PT Treatment Interventions DME instruction;Gait training;Therapeutic exercise;Therapeutic activities;Patient/family education;Balance training;Functional mobility training    PT Goals (Current goals can be found in the Care Plan section)  Acute Rehab PT Goals Patient Stated Goal: return to IND, not need O2 during day PT Goal Formulation: With patient Time For Goal Achievement: 09/25/20 Potential to Achieve Goals: Good    Frequency Min 3X/week   Barriers to discharge        Co-evaluation               AM-PAC PT "6 Clicks" Mobility  Outcome Measure Help needed turning from your back to your side while in a flat bed without using bedrails?: None Help needed moving from lying on your back to sitting on the side of a flat bed without using bedrails?: None Help needed moving to and from a bed to a chair (including a wheelchair)?: None Help needed standing up from a chair using your arms (e.g., wheelchair or bedside chair)?: None Help needed to walk in hospital room?: A Little Help needed climbing 3-5 steps with a railing? : A Little 6 Click Score: 22    End of Session   Activity Tolerance: Patient limited by fatigue Patient left: in bed;with call bell/phone within reach (alarm not set,pt in bathroom on her own at PT arrival)   PT Visit Diagnosis: Difficulty in walking, not elsewhere classified (R26.2);Unsteadiness on feet (R26.81)    Time: 2706-2376 PT Time Calculation (min) (ACUTE ONLY): 24  min   Charges:   PT Evaluation $PT Eval Low Complexity: 1 Low PT Treatments $Gait Training: 8-22 mins        Baxter Flattery, PT  Acute Rehab Dept ([place]) [phone] Pager [phone]  09/11/2020   [facility] 09/11/2020, 4:58 PM

## 2020-09-12 DIAGNOSIS — R339 Retention of urine, unspecified: Secondary | ICD-10-CM

## 2020-09-12 LAB — CBC
HCT: 32.7 % — ABNORMAL LOW (ref 36.0–46.0)
Hemoglobin: 10.7 g/dL — ABNORMAL LOW (ref 12.0–15.0)
MCH: 29.9 pg (ref 26.0–34.0)
MCHC: 32.7 g/dL (ref 30.0–36.0)
MCV: 91.3 fL (ref 80.0–100.0)
Platelets: 337 10*3/uL (ref 150–400)
RBC: 3.58 MIL/uL — ABNORMAL LOW (ref 3.87–5.11)
RDW: 13.5 % (ref 11.5–15.5)
WBC: 7.7 10*3/uL (ref 4.0–10.5)
nRBC: 0 % (ref 0.0–0.2)

## 2020-09-12 LAB — BASIC METABOLIC PANEL
Anion gap: 10 (ref 5–15)
BUN: 25 mg/dL — ABNORMAL HIGH (ref 8–23)
CO2: 28 mmol/L (ref 22–32)
Calcium: 9.1 mg/dL (ref 8.9–10.3)
Chloride: 97 mmol/L — ABNORMAL LOW (ref 98–111)
Creatinine, Ser: 0.88 mg/dL (ref 0.44–1.00)
GFR, Estimated: >60 mL/min (ref >60)
Glucose, Bld: 122 mg/dL — ABNORMAL HIGH (ref 70–99)
Potassium: 4.5 mmol/L (ref 3.5–5.1)
Sodium: 135 mmol/L (ref 135–145)

## 2020-09-12 LAB — GLUCOSE, CAPILLARY: Glucose-Capillary: 103 mg/dL — ABNORMAL HIGH (ref 70–99)

## 2020-09-12 LAB — MAGNESIUM: Magnesium: 2.1 mg/dL (ref 1.7–2.4)

## 2020-09-12 MED ORDER — FUROSEMIDE 40 MG PO TABS
40.0000 mg | ORAL_TABLET | Freq: Two times a day (BID) | ORAL | Status: DC
  Administered 2020-09-12 – 2020-09-15 (×6): 40 mg via ORAL
  Filled 2020-09-12 (×6): qty 1

## 2020-09-12 MED ORDER — LIP MEDEX EX OINT
TOPICAL_OINTMENT | CUTANEOUS | Status: DC | PRN
  Administered 2020-09-12: 1 via TOPICAL

## 2020-09-12 MED ORDER — TAMSULOSIN HCL 0.4 MG PO CAPS
0.4000 mg | ORAL_CAPSULE | Freq: Every day | ORAL | Status: DC
  Administered 2020-09-12 – 2020-09-15 (×4): 0.4 mg via ORAL
  Filled 2020-09-12 (×4): qty 1

## 2020-09-12 MED ORDER — METHYLPREDNISOLONE SODIUM SUCC 125 MG IJ SOLR
60.0000 mg | Freq: Every day | INTRAMUSCULAR | Status: DC
  Administered 2020-09-13 – 2020-09-15 (×3): 60 mg via INTRAVENOUS
  Filled 2020-09-12 (×3): qty 2

## 2020-09-12 NOTE — Progress Notes (Addendum)
PROGRESS NOTE    [name]  MVH:[id number] DOB: 1946/07/14 DOA: 09/10/2020 PCP: [place], [name], MD    Chief Complaint  Patient presents with  . Respiratory Distress    Brief Narrative:  HPI per Dr. 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 Donzetta Matters) who was recently hospitalized from 10/31-11/7 for acute on chronic hypoxic respiratory failure secondary to a COPD exacerbation as well as a mild systolic heart failure exacerbation who presents today via EMS from home with increasing shortness of breath for the past few days.  When she was discharged from the hospital most recently she was advised to use for liters at rest and increased to 6 LPM on exertion however the patient states she has been remaining at 4 L and has had minimal exertion.  Since discharge she has had worsening orthopnea as well.  Patient called her PCP yesterday with complaints of feeling "terrible "with shortness of breath and complained of vomiting and diarrhea earlier in the week.  However, states that she has chronic diarrhea that she uses antidiarrheals for.  She was advised to go to the ED yesterday but did not wish to go and her Lasix dose was increased for a short period of time from 80 mg to 120 however the patient was unaware of this and did not increase her Lasix dose.  Due to her increasing shortness of breath today she called EMS.  She was given 2 nitroglycerin by EMS and was noted to be hypoxic at 87% on baseline 4 LPM and placed on NRB in route.  Also complaining of right-sided sharp pain in the midaxillary line with inspiration which she has had in the past.  Denies missing any doses of her medication, denies any increased salt intake.  States that she is had a 4 pound weight gain in the  past 1 week and has had slight abdominal distention recently.  Denies any fever.  States that her symptoms are similar to all her previous hospitalizations   ED Course: Afebrile, tachycardic, tachypneic,  on 4 L/min. Notable Labs: BNP 145, troponin 28-> 29, WBC 13.6, Hb 11.5, COVID-19 negative. Notable Imaging: CXR with patchy nodular airspace infiltrates in right mid and both lower lungs. Patient received Solu-Medrol and albuterol.    Assessment & Plan:   Principal Problem:   COPD exacerbation ([place]) Active Problems:   Hypothyroidism   Chronic mesenteric ischemia (HCC)   CAD (coronary artery disease)   Acute on chronic respiratory failure with hypoxia (HCC)   OSA (obstructive sleep apnea)   SOB (shortness of breath)   Acute on chronic systolic CHF (congestive heart failure) (HCC)  1 acute on chronic respiratory failure with hypoxia multifactorial secondary to acute COPD exacerbation and suspected mild acute systolic CHF exacerbation Patient recently hospitalized for similar episode of COPD exacerbation and acute systolic heart failure.  Patient noted to have increased O2 requirements on presentation.  Patient still with shortness of breath with diffuse wheezing which is slowly improving. Urine output not properly recorded.  Discontinued oral prednisone and continue IV Solu-Medrol 60 mg every 12 hours for another 24 hours and taper to IV Solu-Medrol 60 mg daily tomorrow.  Incruse and Breo have been discontinued.  Continue Pulmicort, duo nebs, PPI, Mucinex, Singulair, doxycycline, PPI, Pepcid.  Supportive care.  Follow.  2.  Acute COPD exacerbation Patient with diffuse wheezing.  Poor to fair air movement.  Discontinued oral prednisone.  Continue IV Solu-Medrol 60 mg every 12 hours and decreased to Solu-Medrol 60 mg daily tomorrow.  Continue doxycycline, Pulmicort, scheduled duo nebs, PPI, Mucinex.  Will need close outpatient follow-up with pulmonary.  Follow.   3.  Mild acute on chronic  systolic CHF exacerbation BNP minimally elevated however patient noted to have orthopnea and a 4 pound weight gain per patient.  Urine output not properly recorded.  Weight not recorded today.  Was on Lasix 60 mg IV every 12 hours.  Patient with a bump in her BUN.  Improving clinically.  Transition to oral Lasix.  Continue Plavix, statin, aspirin.   4.  GERD Continue PPI, Pepcid.  GI cocktail as needed.  Follow.    5.  Coronary artery disease Stable.  Continue aspirin, statin, Plavix, diuretics.  Follow.  6.  Chronic mesenteric ischemia Continue Plavix, Pletal.  7.  Hypothyroidism Continue Synthroid.  8.  OSA Patient recently diagnosed with OSA via sleep study (07/27/2020) however patient stated has not had a follow-up since sleep study date.  Patient started on CPAP overnight which she states she loves.  Continue CPAP while in-house.  Will need outpatient follow-up with PCP/pulmonary in order to set patient up with outpatient CPAP.  9.  Urinary retention/urinary difficulty Patient with complaints of urinary difficulty and has to bear down and states urine just trickles out.  Trial of flomax.  Outpatient follow-up with urologist.   DVT prophylaxis: Lovenox Code Status: DNR Family Communication: Updated patient.  No family at bedside.  Disposition:   Status is: Inpatient    Dispo: The patient is from: Home              Anticipated d/c is to: Home              Anticipated d/c date is: 2 to 3 days days.              Patient currently in acute on chronic respiratory failure, on IV steroids, antibiotics, IV diuretics.  Not stable for discharge.       Consultants:   None  Procedures:   Chest x-ray 09/10/2020    Antimicrobials:  Doxycycline 09/10/2020   Subjective: Patient sitting up in bed.  States loves the CPAP machine.  Stated she slept well last night.  States improvement with shortness of breath.  No chest pain.  Complaining of difficulty urinating states he  just trickles out.   Objective: Vitals:   09/11/20 2035 09/11/20 2136 09/12/20 0404 09/12/20 0904  BP: 135/90  120/72   Pulse: 88 81 72   Resp: 20 20 20    Temp: (!) 97.5 F (36.4 C)  97.8 F (36.6 C)   TempSrc:      SpO2: 95% 97% 98% 97%  Weight:      Height:        Intake/Output Summary (Last 24 hours) at 09/12/2020 1148 Last data filed at 09/11/2020 1700 Gross per 24 hour  Intake 480 ml  Output 300 ml  Net 180 ml   Filed Weights   09/10/20 0622 09/11/20 0500  Weight: 62.6 kg 63.3 kg    Examination:  General exam: Appears calm and comfortable  Respiratory system: Decreasing diffuse wheezing.  Poor to fair air movement.  Some decreased breath sounds in the bases.  Some scattered crackles.  Speaking in full sentences. Cardiovascular system: RRR no murmurs rubs or gallops.  No  JVD.  No lower extremity edema.  Gastrointestinal system: Abdomen is soft, nontender, nondistended, positive bowel sounds.  No rebound.  No guarding. Central nervous system: Alert and oriented. No focal neurological deficits. Extremities: Symmetric 5 x 5 power. Skin: No rashes, lesions or ulcers Psychiatry: Judgement and insight appear normal. Mood & affect appropriate.     Data Reviewed: I have personally reviewed following labs and imaging studies  CBC: Recent Labs  Lab 09/10/20 0628 09/11/20 0527 09/12/20 0345  WBC 13.6* 9.7 7.7  HGB 11.5* 10.8* 10.7*  HCT 34.4* 32.4* 32.7*  MCV 90.8 90.0 91.3  PLT 314 321 220    Basic Metabolic Panel: Recent Labs  Lab 09/10/20 0628 09/12/20 0345  NA 135 135  K 4.2 4.5  CL 98 97*  CO2 27 28  GLUCOSE 117* 122*  BUN 14 25*  CREATININE 0.94 0.88  CALCIUM 8.9 9.1  MG  --  2.1    GFR: Estimated Creatinine Clearance: 51.7 mL/min (by C-G formula based on SCr of 0.88 mg/dL).  Liver Function Tests: Recent Labs  Lab 09/10/20 0628  AST 27  ALT 24  ALKPHOS 69  BILITOT 0.8  PROT 7.3  ALBUMIN 3.4*    CBG: No results for input(s):  GLUCAP in the last 168 hours.   Recent Results (from the past 240 hour(s))  Resp Panel by RT-PCR (Flu A&B, Covid) Nasopharyngeal Swab     Status: None   Collection Time: 09/10/20  6:29 AM   Specimen: Nasopharyngeal Swab; Nasopharyngeal(NP) swabs in vial transport medium  Result Value Ref Range Status   SARS Coronavirus 2 by RT PCR NEGATIVE NEGATIVE Final    Comment: (NOTE) SARS-CoV-2 target nucleic acids are NOT DETECTED.  The SARS-CoV-2 RNA is generally detectable in upper respiratory specimens during the acute phase of infection. The lowest concentration of SARS-CoV-2 viral copies this assay can detect is 138 copies/mL. A negative result does not preclude SARS-Cov-2 infection and should not be used as the sole basis for treatment or other patient management decisions. A negative result may occur with  improper specimen collection/handling, submission of specimen other than nasopharyngeal swab, presence of viral mutation(s) within the areas targeted by this assay, and inadequate number of viral copies(<138 copies/mL). A negative result must be combined with clinical observations, patient history, and epidemiological information. The expected result is Negative.  Fact Sheet for Patients:  EntrepreneurPulse.com.au  Fact Sheet for Healthcare Providers:  IncredibleEmployment.be  This test is no t yet approved or cleared by the [place] FDA and  has been authorized for detection and/or diagnosis of SARS-CoV-2 by FDA under an Emergency Use Authorization (EUA). This EUA will remain  in effect (meaning this test can be used) for the duration of the COVID-19 declaration under Section 564(b)(1) of the Act, 21 U.S.C.section 360bbb-3(b)(1), unless the authorization is terminated  or revoked sooner.       Influenza A by PCR NEGATIVE NEGATIVE Final   Influenza B by PCR NEGATIVE NEGATIVE Final    Comment: (NOTE) The Xpert Xpress SARS-CoV-2/FLU/RSV  plus assay is intended as an aid in the diagnosis of influenza from Nasopharyngeal swab specimens and should not be used as a sole basis for treatment. Nasal washings and aspirates are unacceptable for Xpert Xpress SARS-CoV-2/FLU/RSV testing.  Fact Sheet for Patients: EntrepreneurPulse.com.au  Fact Sheet for Healthcare Providers: IncredibleEmployment.be  This test is not yet approved or cleared by the [place] FDA and has been authorized for detection and/or diagnosis of SARS-CoV-2 by FDA under an  Emergency Use Authorization (EUA). This EUA will remain in effect (meaning this test can be used) for the duration of the COVID-19 declaration under Section 564(b)(1) of the Act, 21 U.S.C. section 360bbb-3(b)(1), unless the authorization is terminated or revoked.  Performed at [facility], [place] [address], [place], [place] [address]   Expectorated sputum assessment w rflx to resp cult     Status: None   Collection Time: 09/11/20  8:41 AM   Specimen: Expectorated Sputum  Result Value Ref Range Status   Specimen Description EXPECTORATED SPUTUM  Final   Special Requests SPU  Final   Sputum evaluation   Final    THIS SPECIMEN IS ACCEPTABLE FOR SPUTUM CULTURE Performed at [facility], [place] [address], [place], [place] [address]    Report Status 09/11/2020 FINAL  Final  Culture, respiratory     Status: None (Preliminary result)   Collection Time: 09/11/20  8:41 AM  Result Value Ref Range Status   Specimen Description   Final    EXPECTORATED SPUTUM Performed at [place] [address] [place], [place] [address]    Special Requests   Final    SPU Reflexed from [id number] Performed at [facility], [place] [address], [place], [place] [address]    Gram Stain   Final    RARE WBC PRESENT, PREDOMINANTLY PMN RARE GRAM POSITIVE COCCI    Culture   Final    CULTURE REINCUBATED  FOR BETTER GROWTH Performed at [facility], [place] [address], [place], [place] [address]    Report Status PENDING  Incomplete         Radiology Studies: No results found.      Scheduled Meds: . aspirin EC  81 mg Oral Daily  . atorvastatin  80 mg Oral Daily  . budesonide (PULMICORT) nebulizer solution  0.5 mg Nebulization BID  . cilostazol  100 mg Oral BID  . clopidogrel  75 mg Oral Daily  . doxycycline  100 mg Oral Q12H  . enoxaparin (LOVENOX) injection  40 mg Subcutaneous Q24H  . famotidine  40 mg Oral QHS  . fluticasone  2 spray Each Nare Daily  . furosemide  60 mg Intravenous BID  . guaiFENesin  1,200 mg Oral BID  . ipratropium-albuterol  3 mL Nebulization TID  . levothyroxine  112 mcg Oral Q0600  . LORazepam  2 mg Oral QHS  . methylPREDNISolone (SOLU-MEDROL) injection  60 mg Intravenous Q12H  . montelukast  10 mg Oral QHS  . pantoprazole  40 mg Oral Q0600  . PARoxetine  20 mg Oral Daily  . potassium chloride SA  40 mEq Oral BID  . traZODone  100 mg Oral QHS   Continuous Infusions:   LOS: 2 days    Time spent: 40 minutes    Irine Seal, MD [facility]   To contact the attending provider between 7A-7P or the covering provider during after hours 7P-7A, please log into the web site [email or web address] and access using universal [place] password for that web site. If you do not have the password, please call the hospital operator.  09/12/2020, 11:48 AM

## 2020-09-12 NOTE — Evaluation (Signed)
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 Donzetta Matters) who was recently hospitalized from 10/31-11/7 for acute on chronic hypoxic respiratory failure secondary to a COPD exacerbation as well as a mild systolic heart failure exacerbation who presents today via EMS from home with increasing shortness of breath for the past few days. admitted for COPD exac, CHF   Clinical Impression   Pt admitted with the above diagnosis and has the deficits listed below. Pt would benefit from cont OT to review energy conservation techniques and practice adls implementing them. Pt was provided with energy conservation handout and verbalized understanding of things she may do at home to assist with energy level. Pt has had many admissions in last few months therefore will cont OT at this time with focus on adls and energy conservation techniques.     Follow Up Recommendations  Home health OT;Supervision - Intermittent    Equipment Recommendations  Tub/shower seat    Recommendations for Other Services       Precautions / Restrictions Precautions Precautions: Other (comment) (SOB and O2 sats to be watched.) Precaution Comments: O2 dep. Monitor O2 levels Restrictions Weight Bearing Restrictions: No      Mobility Bed Mobility Overal bed mobility: Independent             General bed mobility comments: Patient found sitting EOB. Patient able to manuever in and out of bed without assistance during treatment.    Transfers Overall transfer level: Needs assistance Equipment used: None Transfers: Sit to/from Stand Sit to Stand: Supervision Stand  pivot transfers: Supervision       General transfer comment: supervision for safety only since first visit    Balance Overall balance assessment: Needs assistance Sitting-balance support: No upper extremity supported;Feet supported Sitting balance-Leahy Scale: Normal     Standing balance support: No upper extremity supported Standing balance-Leahy Scale: Fair Standing balance comment: no loss of balance noted in room but no big challenges presented.                           ADL either performed or assessed with clinical judgement   ADL Overall ADL's : Needs assistance/impaired Eating/Feeding: Independent;Sitting   Grooming: Supervision/safety;Standing   Upper Body Bathing: Supervision/ safety;Set up;Sitting   Lower Body Bathing: Supervison/ safety;Sit to/from stand;Cueing for compensatory techniques Lower Body Bathing Details (indicate cue type and reason): Pt educated on use of long bath brush/sponge and use of 3-in-1 BSC for energy conservation in shower. Cautioned to avoid extreme temps of water, but to keep room warm in cold weather during bathing. Upper Body Dressing : Set up;Sitting   Lower Body Dressing: Supervision/safety;Sit to/from stand;Cueing for compensatory techniques Lower Body Dressing Details (indicate cue type and reason): Pt able to demo figure 4 technique while sitting EOB for seated LB dressing, but reports unsteadiness with standing trial and need of Min guard for safety with 1 HHA. Toilet Transfer: Occupational hygienist Details (indicate cue type and reason): Min HHA Toileting- Clothing Manipulation and Hygiene: Min guard;Sit to/from stand;Cueing for compensatory techniques       Functional mobility during ADLs: Min guard General ADL Comments: Hand held assist.  Vision Baseline Vision/History: No visual deficits Patient Visual Report: No change from baseline Vision Assessment?: No apparent visual deficits      Perception     Praxis Praxis Praxis tested?: Within functional limits    Pertinent Vitals/Pain Pain Assessment: No/denies pain     Hand Dominance Right   Extremity/Trunk Assessment Upper Extremity Assessment Upper Extremity Assessment: Overall WFL for tasks assessed   Lower Extremity Assessment Lower Extremity Assessment: Defer to PT evaluation   Cervical / Trunk Assessment Cervical / Trunk Assessment: Normal   Communication Communication Communication: HOH   Cognition Arousal/Alertness: Awake/alert Behavior During Therapy: WFL for tasks assessed/performed Overall Cognitive Status: Within Functional Limits for tasks assessed                                 General Comments: Pleasant, cooperative, A&Ox4   General Comments  Pt with very little SOB during this session. Did stay in room on 3L O2.  O2 sats stayed above 90%.    Exercises     Shoulder Instructions      Home Living Family/patient expects to be discharged to:: Private residence Living Arrangements: Spouse/significant other Available Help at Discharge: Family;Available 24 hours/day Type of Home: House       Home Layout: Two level;1/2 bath on main level Alternate Level Stairs-Number of Steps: flight (16)   Bathroom Shower/Tub: Occupational psychologist: Handicapped height     Home Equipment: [place] - single point;Wheelchair - manual          Prior Functioning/Environment Level of Independence: Needs assistance  Gait / Transfers Assistance Needed: amb with cane recently d/t rapid fatigue/weakness ADL's / Homemaking Assistance Needed: Reports that self care has become increasingly diffcult due to shortness of breath and resultant limited energy. Communication / Swallowing Assistance Needed: No deficits Comments: pt reports she is quite active and independent at her baseline        OT Problem List: Decreased activity tolerance;Impaired balance (sitting and/or  standing);Decreased knowledge of use of DME or AE;Cardiopulmonary status limiting activity      OT Treatment/Interventions: Self-care/ADL training;Energy conservation    OT Goals(Current goals can be found in the care plan section) Acute Rehab OT Goals Patient Stated Goal: return to being independent at home OT Goal Formulation: With patient Time For Goal Achievement: 09/19/20 Potential to Achieve Goals: Good ADL Goals Additional ADL Goal #1: Pt will walk to bathroom and toilet on high commode with mod I. Additional ADL Goal #2: Pt will gather all clothes and dress self using energy conservation techniques with mod I. Additional ADL Goal #3: Pt will state 3 things she can implement at home to assist with conserving energy during adls Ily.  OT Frequency: Min 2X/week   Barriers to D/C:            Co-evaluation              AM-PAC OT "6 Clicks" Daily Activity     Outcome Measure Help from another person eating meals?: None Help from another person taking care of personal grooming?: None Help from another person toileting, which includes using toliet, bedpan, or urinal?: A Little Help from another person bathing (including washing, rinsing, drying)?: A Little Help from another person to put on and taking off regular upper body clothing?: None Help from another person to put on and taking off regular lower body clothing?: A Little 6 Click Score: 21  End of Session Equipment Utilized During Treatment: Oxygen Nurse Communication: Mobility status  Activity Tolerance: Patient limited by fatigue Patient left: in bed;with call bell/phone within reach  OT Visit Diagnosis: Unsteadiness on feet (R26.81)                Time: 5726-2035 OT Time Calculation (min): 20 min Charges:  OT General Charges $OT Visit: 1 Visit OT Evaluation $OT Eval Low Complexity: 1 Low  Glenford Peers 09/12/2020, 8:56 AM

## 2020-09-12 NOTE — Telephone Encounter (Signed)
Pt is currently admitted in the hospital.

## 2020-09-12 NOTE — TOC Progression Note (Signed)
Transition of Care [facility]) - Progression Note    Patient Details  Name: [name] MRN: [id number] Date of Birth: 1945-12-18  [facility]) CM/SW Contact  Joaquin Courts, RN Phone Number: 09/12/2020, 1:25 PM  Clinical Narrative:    CM received notification that patient is in need of cpap machine and completed sleep study on 10/13.  Review of sleep study noted indicate patient needs to return to sleep lab for full night sleep titration study.  CM spoke with DME agency, [employer], who supplies patient's home oxygen.  Rep states patient will need to do the titration study in order for her insurance to approve the cpap with the oxygen combination.  MD notified that patient will need this completed outpatient.  Patient notified of this as well.    Expected Discharge Plan: [place] Barriers to Discharge: Continued Medical Work up  Expected Discharge Plan and Services Expected Discharge Plan: [place]   Discharge Planning Services: CM Consult   Living arrangements for the past 2 months: Single Family Home                                       Social Determinants of Health (SDOH) Interventions    Readmission Risk Interventions No flowsheet data found.

## 2020-09-13 LAB — GLUCOSE, CAPILLARY: Glucose-Capillary: 78 mg/dL (ref 70–99)

## 2020-09-13 LAB — CBC
HCT: 33.9 % — ABNORMAL LOW (ref 36.0–46.0)
Hemoglobin: 11 g/dL — ABNORMAL LOW (ref 12.0–15.0)
MCH: 29.6 pg (ref 26.0–34.0)
MCHC: 32.4 g/dL (ref 30.0–36.0)
MCV: 91.4 fL (ref 80.0–100.0)
Platelets: 380 10*3/uL (ref 150–400)
RBC: 3.71 MIL/uL — ABNORMAL LOW (ref 3.87–5.11)
RDW: 13.5 % (ref 11.5–15.5)
WBC: 4.6 10*3/uL (ref 4.0–10.5)
nRBC: 0 % (ref 0.0–0.2)

## 2020-09-13 LAB — BASIC METABOLIC PANEL
Anion gap: 8 (ref 5–15)
BUN: 23 mg/dL (ref 8–23)
CO2: 29 mmol/L (ref 22–32)
Calcium: 8.8 mg/dL — ABNORMAL LOW (ref 8.9–10.3)
Chloride: 99 mmol/L (ref 98–111)
Creatinine, Ser: 0.85 mg/dL (ref 0.44–1.00)
GFR, Estimated: >60 mL/min (ref >60)
Glucose, Bld: 86 mg/dL (ref 70–99)
Potassium: 4.2 mmol/L (ref 3.5–5.1)
Sodium: 136 mmol/L (ref 135–145)

## 2020-09-13 LAB — CULTURE, RESPIRATORY W GRAM STAIN: Culture: NORMAL

## 2020-09-13 LAB — MAGNESIUM: Magnesium: 2.1 mg/dL (ref 1.7–2.4)

## 2020-09-13 NOTE — Progress Notes (Signed)
CARDIOLOGY OFFICE NOTE  Date:  09/27/2020    [name] Date of Birth: [birth year] Medical Record #[id number]  PCP:  [place], [name], MD  Cardiologist:  Servando Snare   Chief Complaint  Patient presents with  . Follow-up    History of Present Illness: [name] is a 74 y.o. female who presents today for a follow up visit.  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 She has seen me primarily.    She has a history of smoking, HTN,Gold stage IIICOPD, right breast cancer, hyperlipidemia, PAD, and mesenteric vascular disease. She had a mesenteric angiogram done in 5/12 by Dr. Bridgett Larsson, showing occluded celiac and SMA arteries with patent IMA. She was planned for aorto-mesenteric bypass. She was referred for cardiology evaluation prior to surgery. However, it was ultimately decided not to treat her mesenteric disease surgically.   Given her exertional chest tightness and shortness of breath as well as known vascular disease, Dr. Aundra Dubin set her up for left heart cath. This was done in 5/12 and showed a 70-80% ostial stenosis of a small to moderate 2nd diagonal. This was too small to intervene on and not likely to be particularly symptomatic. Lower extremity arterial dopplers showed a severe focal proximal right SFA stenosis with collaterals from the PFA. She had a VATS for a right-sided lung nodule that showed granulomatous inflammation (no cancer). She quit smoking for a short time. She saw Dr. Fletcher Anon for PAD evaluation, and it was decided to treat her medically. She tried cilostazol but did not see much difference with its use so stopped it.   I have seen her sporadically over the past several years - she has been referred back to [facility] to Dr. Donzetta Matters for her vascular disease. Her son has died and this has been very upsetting - she has returned to smoking. When seen back in January of 2021 - her medicines were unclear - she was not able to afford some of them - having lots of claudication. BP  different between her arms. She was dizzy. Having GI complaints as well.   Presented to the hospital back in August with worsening DOE. Had had multiple rounds of antibiotics and steroids over the past year. Had resumed smoking due to the death of her son - lots of stress. Echo showed reduced EF. Seen by cardiology and was cathed - no significant disease noted. She was started on Paxil given significant depression.   She had been sent to see Dr. Radford Pax (never seen) from her PCP due to concerns for dizziness and hypotension. She has known marked difference in BP between her arms. She was in the hospital and was not able to keep this appointment.   I last saw her in late August - she was not feeling well - not sleeping - out of Ativan. BP low. Dizzy. Crying less with the Paxil. On continuous oxygen. Not smoking. She was due to see Dr. Donzetta Matters back. 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 Donzetta Matters.   Admitted late last month with COPD exacerbation. Had had respiratory failure back in October.   Comes in today. Here alone today. She notes her BP has been good since the stent placement - she did miss her follow up due to being in the hospital. She has had a sleep study - going on CPAP - bought her own machine. Not smoking. No chest pain. No lower extremity edema. No belly  pain - no diarrhea noted. The holidays are hard for her since her son died.   Past Medical History:  Diagnosis Date  . Bilateral leg cramps   . Bladder neoplasm   . Cancer ([place])   . CHF (congestive heart failure) ([place])   . Chronic deep vein thrombosis (DVT) of right lower extremity ([place])    05/ 2018  chronic non-occlusive DVT RLE  . COPD, frequent exacerbations ([place])    03-06-2018 per pt last exacerbation 12/ 2018  . Coronary artery disease    cardiologist-  dr Aundra Dubin-- 03-11-2018 er cath , 80% stenosis in the ostial second diagonal  . Dyspnea on minimal  exertion   . Emphysema/COPD ([place])    CAT score- 17  . Family history of breast cancer   . Family history of colon cancer   . Family history of melanoma   . Family history of ovarian cancer   . Family history of pancreatic cancer   . Fibromyalgia 1995  . GERD (gastroesophageal reflux disease)   . Hiatal hernia   . History of breast cancer   . History of diverticulitis of colon    2002  s/p  sigmoid colectomy  . History of multiple pulmonary nodules    hx RUL nodules x2  s/p right VATS w/ wedge resection 09-11-2005 and 06-14-2011  both  necrotizing granulomatous inflammation w/ cystic area of necrosis & focal calcification  . History of right breast cancer oncologist-  dr Jana Hakim-- no recurrence   dx 2010--  IDC, Stage IA , ER/PR+,  (pT1c pN0) 11-09-2008 right lumpectomy;  12-18-2008 right simple mastectomy for DCIS margins;  completed chemotherapy 2010 (no radiation) and completed antiestrogen therapy  . Hx of colonic polyps    Dr. Cristina Gong -last study '11  . Hyperlipidemia   . Hypertension   . Hypothyroidism   . 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 Nocturia   . OA (osteoarthritis)    thumbs  . On supplemental oxygen therapy    03-06-2018 per pt uses only at night,  checks O2 sats at home,  stated am sat 89% after moving around average 93-94% with RA  . Osteoporosis   . Peripheral arterial occlusive disease [facility]) vascular-- dr chen/ dr Donzetta Matters   proximal right SFA severe focal stenosis with collaterals from the PFA/  04/ 2012 occluded celiac and SMA arteries with distal reconstitution w/ patent IMA  . Peripheral vascular disease ([place])    chronic DVT RLE,  mesenteric vascular disease  . Right lower lobe pulmonary nodule    Chest CT 08-29-2017  . Stage 3 severe COPD by GOLD classification ([place]) hx frequent exacerbations--   pulmologist-  dr Maryann Alar--  per lov note , dated 12-20-2017, oxyogen 2L is prescribed for use with exertion (but pt only uses  mostly at night), O2 sats on RA run the 80s , this day sat 86% RA and with 2L O2 sat 92%  . Varicose veins of leg with swelling    varicose vein surgery - Dr. Aleda Grana  . Wears glasses   . Wears hearing aid in both ears     Past Surgical History:  Procedure Laterality Date  . ABDOMINAL AORTOGRAM N/A 07/11/2020   Procedure: ABDOMINAL AORTOGRAM;  Surgeon: Waynetta Sandy, MD;  Location: [place] [facility];  Service: Cardiovascular;  Laterality: N/A;  . AORTIC ARCH ANGIOGRAPHY N/A 07/11/2020   Procedure: AORTIC ARCH ANGIOGRAPHY;  Surgeon: Waynetta Sandy, MD;  Location: [facility]  INVASIVE CV LAB;  Service: Cardiovascular;  Laterality: N/A;  . BREAST EXCISIONAL BIOPSY Left 10/15/2008  . BREAST LUMPECTOMY W/ NEEDLE LOCALIZATION Right 11-09-2008    dr Brantley Stage   [facility]  . CARDIAC CATHETERIZATION  03-12-2011   dr Aundra Dubin   70-80% ostial stenosis in small second diagonal (appears to small for intervention),  dLAD 40-50%,  minimal luminal irregulartieis involoving LCFx and RCA,  LVEF 55%  . CATARACT EXTRACTION W/ INTRAOCULAR LENS  IMPLANT, BILATERAL  2011  . CYSTOSCOPY N/A 03/11/2018   Procedure: CYSTOSCOPY WITH INSTILLATION OF POST OPERATIVE EPIRUBICIN;  Surgeon: Festus Aloe, MD;  Location: [facility];  Service: Urology;  Laterality: N/A;  . LEFT HEART CATH AND CORONARY ANGIOGRAPHY N/A 05/25/2020   Procedure: LEFT HEART CATH AND CORONARY ANGIOGRAPHY;  Surgeon: Troy Sine, MD;  Location: [place] [facility];  Service: Cardiovascular;  Laterality: N/A;  . MASTECTOMY Right   . PARTIAL COLECTOMY  2002   sigmoid and appectomy (diverticulitis)  . PARTIAL MASTECTOMY WITH NEEDLE LOCALIZATION Left 02/23/2013   Procedure:  LEFT PARTIAL MASTECTOMY WITH NEEDLE LOCALIZATION;  Surgeon: Adin Hector, MD;  Location: [place];  Service: General;  Laterality: Left;  . PERIPHERAL VASCULAR INTERVENTION  07/11/2020   Procedure: PERIPHERAL VASCULAR INTERVENTION;  Surgeon: Waynetta Sandy, MD;  Location: [place] [facility];  Service: Cardiovascular;;  . RECONSTRUCTION BREAST W/ LATISSIMUS DORSI FLAP Right 05-30-2009   dr Harlow Mares  [facility]  . REDUCTION MAMMAPLASTY Left   . SIMPLE MASTECTOMY Right 12-28-2008    dr Brantley Stage  [facility]  . TRANSTHORACIC ECHOCARDIOGRAM  07-09-2016  dr Aundra Dubin   ef 55-60%, grade 1 diastoic dysfunction/  mild TR  . TRANSURETHRAL RESECTION OF BLADDER TUMOR N/A 03/11/2018   Procedure: TRANSURETHRAL RESECTION OF BLADDER TUMOR (TURBT) 2-5cm;  Surgeon: Festus Aloe, MD;  [facility];  Service: Urology;  Laterality: N/A;  . UPPER EXTREMITY ANGIOGRAPHY Left 07/11/2020   Procedure: Upper Extremity Angiography;  Surgeon: Waynetta Sandy, MD;  Location: [place] [facility];  Service: Cardiovascular;  Laterality: Left;  Marland Kitchen VAGINAL HYSTERECTOMY  1973  . VIDEO ASSISTED THORACOSCOPY (VATS)/WEDGE RESECTION Right 09-11-2005  &  06-14-2011   dr hendrickso  [facility]   both RUL     Medications: Current Meds  Medication Sig  . acetaminophen (TYLENOL) 325 MG tablet Take 2 tablets (650 mg total) by mouth every 6 (six) hours as needed for mild pain (or Fever >/= 101).  Marland Kitchen albuterol (VENTOLIN HFA) 108 (90 Base) MCG/ACT inhaler TAKE 2 PUFFS BY MOUTH EVERY 6 HOURS AS NEEDED FOR WHEEZE OR SHORTNESS OF BREATH  . amLODipine (NORVASC) 5 MG tablet 1 tablet  . aspirin EC 81 MG tablet Take 1 tablet (81 mg total) by mouth daily.  Marland Kitchen atorvastatin (LIPITOR) 80 MG tablet TAKE 1 TABLET BY MOUTH EVERY DAY  . benzonatate (TESSALON PERLES) 100 MG capsule Take 1 capsule (100 mg total) by mouth 3 (three) times daily as needed for cough.  . Biotin 10000 MCG TABS Take 10,000 mcg by mouth daily.  . bisacodyl (WOMENS LAXATIVE) 5 MG EC tablet 1 tablet  . budesonide (PULMICORT) 0.5 MG/2ML nebulizer solution Take 2 mLs (0.5 mg total) by nebulization 2 (two) times daily.  . cilostazol (PLETAL) 100 MG tablet Take 1 tablet (100 mg total) by mouth 2 (two) times daily.  .  clopidogrel (PLAVIX) 75 MG tablet Take 1 tablet (75 mg total) by mouth daily.  Marland Kitchen dextromethorphan-guaiFENesin (MUCINEX DM) 30-600 MG 12hr tablet Take 1 tablet  by mouth 2 (two) times daily as needed for cough.   . diclofenac (VOLTAREN) 75 MG EC tablet 1 tablet  . doxycycline (VIBRA-TABS) 100 MG tablet Take 1 tablet (100 mg total) by mouth 2 (two) times daily for 14 days.  . fluticasone (FLONASE) 50 MCG/ACT nasal spray USE 1 SPRAY INTO EACH NOSTRIL TWICE A DAY  . Fluticasone-Umeclidin-Vilant (TRELEGY ELLIPTA) 200-62.5-25 MCG/INH AEPB Inhale 1 puff into the lungs daily.  . furosemide (LASIX) 40 MG tablet Take 1 tablet (40 mg total) by mouth 2 (two) times daily.  Marland Kitchen HYDROcodone-homatropine (HYCODAN) 5-1.5 MG/5ML syrup Take 5 mLs by mouth every 6 (six) hours as needed for cough.  Marland Kitchen ipratropium-albuterol (DUONEB) 0.5-2.5 (3) MG/3ML SOLN Inhale 3 mLs into the lungs every 6 (six) hours as needed (sob, wheezing).  Marland Kitchen levothyroxine (SYNTHROID) 112 MCG tablet TAKE 1 TABLET (112 MCG TOTAL) BY MOUTH DAILY BEFORE BREAKFAST.  Marland Kitchen linaclotide (LINZESS) 145 MCG CAPS capsule 1 capsule at least 30 minutes before the first meal of the day on an empty stomach  . LORazepam (ATIVAN) 2 MG tablet Take 1 tablet (2 mg total) by mouth at bedtime.  . metoprolol tartrate (LOPRESSOR) 25 MG tablet 1 tablet  . montelukast (SINGULAIR) 10 MG tablet Take 1 tablet (10 mg total) by mouth at bedtime.  . pantoprazole (PROTONIX) 40 MG tablet TAKE 1 TABLET BY MOUTH EVERY DAY  . PARoxetine (PAXIL) 20 MG tablet TAKE 1 TABLET BY MOUTH EVERYDAY AT BEDTIME  . potassium chloride SA (KLOR-CON M20) 20 MEQ tablet Take 1 tablet (20 mEq total) by mouth 3 (three) times daily.  . predniSONE (DELTASONE) 10 MG tablet Take 1 tablet (10 mg total) by mouth daily.  . tamsulosin (FLOMAX) 0.4 MG CAPS capsule Take 1 capsule (0.4 mg total) by mouth daily.  . traZODone (DESYREL) 100 MG tablet TAKE 1 TABLET BY MOUTH EVERYDAY AT BEDTIME     Allergies: Allergies   Allergen Reactions  . Sulfa Antibiotics Swelling    Social History: The patient  reports that she quit smoking about 4 years ago. Her smoking use included cigarettes. She has a 37.50 pack-year smoking history. She has never used smokeless tobacco. She reports current alcohol use of about 10.0 standard drinks of alcohol per week. She reports that she does not use drugs.   Family History: The patient's family history includes Breast cancer (age of onset: 66) in her niece; COPD in her mother; Cancer in her sister; Colon cancer in her mother; Coronary artery disease in her brother and father; Diabetes in her mother; Emphysema in her mother; Heart attack in her brother and father; Heart disease in her father; Hypertension in her mother, sister, and sister; Melanoma in her niece and sister; Ovarian cancer in her sister; Pancreatic cancer in her sister; Sudden death in her father; Throat cancer in her sister.   Review of Systems: Please see the history of present illness.   All other systems are reviewed and negative.   Physical Exam: VS:  BP 120/80   Pulse 82   Ht 5\' 6"  (1.676 m)   Wt 133 lb 9.6 oz (60.6 kg)   SpO2 93%   BMI 21.56 kg/m  .  BMI Body mass index is 21.56 kg/m.  Wt Readings from Last 3 Encounters:  09/27/20 133 lb 9.6 oz (60.6 kg)  09/26/20 135 lb 9.6 oz (61.5 kg)  09/15/20 128 lb 5 oz (58.2 kg)   BP was 110/60 in the left arm and 128/80 in the right arm.  General: Pleasant. She looks better today - not as anxious. She is in no acute distress.   Cardiac: Regular rate and rhythm. No murmurs, rubs, or gallops. No edema.  Respiratory:  Lungs are clear to auscultation bilaterally with normal work of breathing.  GI: Soft and nontender. Belly little protuberant.  MS: No deformity or atrophy. Gait and ROM intact.  Skin: Warm and dry. Color is normal.  Neuro:  Strength and sensation are intact and no gross focal deficits noted.  Psych: Alert, appropriate and with normal  affect.   LABORATORY DATA:  EKG:  EKG is not ordered today.    Lab Results  Component Value Date   WBC 5.5 09/15/2020   HGB 11.7 (L) 09/15/2020   HCT 35.0 (L) 09/15/2020   PLT 480 (H) 09/15/2020   GLUCOSE 95 09/26/2020   CHOL 147 06/04/2018   TRIG 73 06/04/2018   HDL 83 06/04/2018   LDLDIRECT 88.0 08/18/2013   LDLCALC 49 06/04/2018   ALT 22 09/15/2020   AST 17 09/15/2020   NA 136 09/26/2020   K 4.2 09/26/2020   CL 100 09/26/2020   CREATININE 0.95 (H) 09/26/2020   BUN 19 09/26/2020   CO2 25 09/26/2020   TSH 0.433 05/21/2020   INR 0.98 03/23/2013   HGBA1C 5.7 (H) 01/03/2018       BNP (last 3 results) Recent Labs    05/20/20 1657 09/10/20 0628  BNP 99.7 145.4*    ProBNP (last 3 results) No results for input(s): PROBNP in the last 8760 hours.   Other Studies Reviewed Today:  07/11/2020 Pre-operative Diagnosis: Left subclavian artery steal Post-operative diagnosis:  Same Surgeon:  Erlene Quan C. Donzetta Matters, MD Procedure Performed: 1.  Ultrasound-guided cannulation right common femoral artery 2.  Arch aortogram 3.  Stent of left subclavian artery with 8 x 29 mm VBX 4.  Abdominal aortogram 5.  Moderate sedation with fentanyl Versed for 48 minutes  Indications: 74 year old female with known history of mesenteric artery stenoses.  This has never been intervened upon.  She more recently was found to have a subtotally occluded left subclavian artery with retrograde flow in her vertebral artery which was noted to be symptomatic.  She is now indicated for angiography possible invention.  Findings: Left subclavian artery was heavily calcified she had retrograde flow in her left vertebral artery.  She appears to have a bovine arch these arteries are all patent.  After intervention of the left subclavian artery there is antegrade flow in the left vertebral artery 0% residual stenosis were previously was occluded.  Celiac and SMA appeared to be occluded and fills retrograde via  the IMA.  Bilateral renal arteries without stenosis.     LEFT HEART CATH AND CORONARY ANGIOGRAPHY 05/2020  Conclusion  No significant coronary obstructive disease. The left main is short and immediately trifurcates into a large LAD, ramus immediate and left circumflex vessel. The midportion of the LAD dips intramyocardially slightly but there is no evidence for systolic bridging. The RCA is a dominant vessel that has calcification without obstructive stenosis.  Hyperdynamic LV function with EF estimated at 65%. LVEDP 10 mmHg.  RECOMMENDATION: Medical therapy.   Echo Impression 05/21/20  1. Left ventricular ejection fraction, by estimation, is 45 to 50%. The  left ventricle has mildly decreased function. The left ventricle  demonstrates global hypokinesis. Indeterminate diastolic filling due to  E-A fusion. Definity contrast administered  but images remain suboptimal to exclude regional wall motion  abnormalities.  2. Right ventricular systolic function  is normal. The right ventricular  size is normal. Tricuspid regurgitation signal is inadequate for assessing  PA pressure.  3. Left atrial size was mildly dilated.  4. The mitral valve is degenerative. Trivial mitral valve regurgitation.  Mild mitral stenosis. The mean mitral valve gradient is 3.0 mmHg with  average heart rate of 85 bpm.  5. The aortic valve is grossly normal. Aortic valve regurgitation is not  visualized. No aortic stenosis is present.  6. The inferior vena cava is dilated in size with >50% respiratory  variability, suggesting right atrial pressure of 8 mmHg.     Assessment & Plan:  1. Severe COPD - has had several recent exacerbations with hypoxia - on oxygen - now on CPAP.  Not smoking.   2. Prior cath from August - no significant CAD noted. Favor medical management.   3. Prior hypotension - had marked variability of BP between her arms - now s/p stent to the subclavian - BP's look better  overall.   4. Prior mild LV dysfunction noted off echo - but hyperdynamic by cath. She tends to have chronic dizziness - BP is ok - doubt she would tolerate much in the way of medicines.   5. HLD - on statin - needs labs on return or by PCP  6. Extensive PAD - followed by Dr. Donzetta Matters - she is going to get her follow up with him rescheduled.   7. Past tobacco abuse - not smoking.  8. 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 This seems better today.   Current medicines are reviewed with the patient today.  The patient does not have concerns regarding medicines other than what has been noted above.  The following changes have been made:  See above.  Labs/ tests ordered today include:   No orders of the defined types were placed in this encounter.    Disposition:   FU with Dr. Johney Frame as a new patient in 3 to 4 months. She is aware that I am leaving in February.    Patient is agreeable to this plan and will call if any problems develop in the interim.   SignedTruitt Merle, NP  09/27/2020 3:38 PM  [facility] [address] [place], [place]  [address] Phone: [phone] Fax: [phone]

## 2020-09-13 NOTE — Progress Notes (Signed)
PROGRESS NOTE    [name]  WJX:[id number] DOB: 1945-11-05 DOA: 09/10/2020 PCP: [place], [name], MD    Chief Complaint  Patient presents with  . Respiratory Distress    Brief Narrative:  HPI per Dr. 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 Donzetta Matters) who was recently hospitalized from 10/31-11/7 for acute on chronic hypoxic respiratory failure secondary to a COPD exacerbation as well as a mild systolic heart failure exacerbation who presents today via EMS from home with increasing shortness of breath for the past few days.  When she was discharged from the hospital most recently she was advised to use for liters at rest and increased to 6 LPM on exertion however the patient states she has been remaining at 4 L and has had minimal exertion.  Since discharge she has had worsening orthopnea as well.  Patient called her PCP yesterday with complaints of feeling "terrible "with shortness of breath and complained of vomiting and diarrhea earlier in the week.  However, states that she has chronic diarrhea that she uses antidiarrheals for.  She was advised to go to the ED yesterday but did not wish to go and her Lasix dose was increased for a short period of time from 80 mg to 120 however the patient was unaware of this and did not increase her Lasix dose.  Due to her increasing shortness of breath today she called EMS.  She was given 2 nitroglycerin by EMS and was noted to be hypoxic at 87% on baseline 4 LPM and placed on NRB in route.  Also complaining of right-sided sharp pain in the midaxillary line with inspiration which she has had in the past.  Denies missing any doses of her medication, denies any increased salt intake.  States that she is had a 4 pound weight gain in the  past 1 week and has had slight abdominal distention recently.  Denies any fever.  States that her symptoms are similar to all her previous hospitalizations   ED Course: Afebrile, tachycardic, tachypneic,  on 4 L/min. Notable Labs: BNP 145, troponin 28-> 29, WBC 13.6, Hb 11.5, COVID-19 negative. Notable Imaging: CXR with patchy nodular airspace infiltrates in right mid and both lower lungs. Patient received Solu-Medrol and albuterol.    Assessment & Plan:   Principal Problem:   COPD exacerbation ([place]) Active Problems:   Hypothyroidism   Chronic mesenteric ischemia (HCC)   CAD (coronary artery disease)   Acute on chronic respiratory failure with hypoxia (HCC)   OSA (obstructive sleep apnea)   SOB (shortness of breath)   Acute on chronic systolic CHF (congestive heart failure) (HCC)  1 acute on chronic respiratory failure with hypoxia multifactorial secondary to acute COPD exacerbation and suspected mild acute systolic CHF exacerbation Patient recently hospitalized for similar episode of COPD exacerbation and acute systolic heart failure.  Patient noted to have increased O2 requirements on presentation.  Patient still with shortness of breath with diffuse wheezing which is slowly improving. Urine output is over the past 24 hours.  Patient is -1.340 L during this hospitalization.  Decrease IV Solu-Medrol to 60 mg daily.  Continue Pulmicort, duo nebs, PPI, Mucinex, Singulair, doxycycline, Pepcid.  Supportive care.  Will need close outpatient follow-up with pulmonary.   2.  Acute COPD exacerbation Patient with diffuse wheezing.  Poor to fair air movement.  Was on oral prednisone which has been discontinued.  Patient still with diffuse wheezing decrease IV Solu-Medrol to 60 mg daily.  Continue doxycycline, Pulmicort, scheduled duo nebs, PPI, Mucinex.  Will need close outpatient follow-up with pulmonary.   3.  Mild acute on chronic systolic CHF exacerbation BNP minimally elevated however patient  noted to have orthopnea and a 4 pound weight gain per patient.  Urine output of 3.8 L over the past 24 hours.  Patient -1.340 L during this hospitalization.  Was on IV Lasix and has been transitioned to oral Lasix.  Continue statin, aspirin, Plavix.  Outpatient follow-up.   4.  GERD Continue PPI, Pepcid.  GI cocktail as needed.  Follow.    5.  Coronary artery disease Stable.  Continue aspirin, Plavix, statin, diuretics.  Outpatient follow-up.    6.  Chronic mesenteric ischemia Continue Plavix, Pletal.  7.  Hypothyroidism Synthroid.   8.  OSA Patient recently diagnosed with OSA via sleep study (07/27/2020) however patient stated has not had a follow-up since sleep study date.  Patient tolerating CPAP while in-house.  Will need outpatient follow-up with PCP/pulmonary in order to set up patient with outpatient CPAP.  9.  Urinary retention/urinary difficulty Patient with complaints of urinary difficulty and has to bear down and states urine just trickles out.  Patient started on Flomax 09/12/2020 which she seems to have tolerated however unsure whether there was improvement with her urinary symptoms.  Patient however noted to have a urine output of 3.8 L over the past 24 hours.  Will need outpatient follow-up with urology.     DVT prophylaxis: Lovenox Code Status: DNR Family Communication: Updated patient.  No family at bedside.  Disposition:   Status is: Inpatient    Dispo: The patient is from: Home              Anticipated d/c is to: Home              Anticipated d/c date is: 2 to 3 days days.              Patient currently in acute on chronic respiratory failure, on IV steroids taper, antibiotics, diuretics.  Not stable for discharge.       Consultants:   None  Procedures:   Chest x-ray 09/10/2020    Antimicrobials:  Doxycycline 09/10/2020   Subjective: Patient sleeping but arousable.  Stated slept well on CPAP last night as well.  Still complains of significant  shortness of breath and wheezing.  States she is not sure whether urination improved on Flomax.    Objective: Vitals:   09/12/20 2028 09/13/20 0500 09/13/20 0603 09/13/20 0857  BP: (!) 151/86  122/82   Pulse: 84  75   Resp: (!) 24  (!) 24   Temp: 97.9 F (36.6 C)  97.7 F (36.5 C)   TempSrc: Oral     SpO2: 98%  97% 93%  Weight:  64.4 kg    Height:        Intake/Output Summary (Last 24 hours) at 09/13/2020 1102 Last data filed at 09/13/2020 0522 Gross per 24 hour  Intake 2060 ml  Output 3800 ml  Net -1740 ml   Filed Weights   09/10/20 0622 09/11/20 0500 09/13/20 0500  Weight: 62.6 kg 63.3 kg 64.4 kg    Examination:  General exam: NAD Respiratory system: Decreasing diffuse wheezing.  Poor to fair air movement.  Some scattered crackles.  Cardiovascular system: Regular  rate rhythm no murmurs rubs or gallops.  No JVD.  No lower extremity edema. Gastrointestinal system: Abdomen is soft, nontender, nondistended, positive bowel sounds.  No rebound.  No guarding. Central nervous system: Alert and oriented. No focal neurological deficits. Extremities: Symmetric 5 x 5 power. Skin: No rashes, lesions or ulcers Psychiatry: Judgement and insight appear normal. Mood & affect appropriate.     Data Reviewed: I have personally reviewed following labs and imaging studies  CBC: Recent Labs  Lab 09/10/20 0628 09/11/20 0527 09/12/20 0345 09/13/20 0430  WBC 13.6* 9.7 7.7 4.6  HGB 11.5* 10.8* 10.7* 11.0*  HCT 34.4* 32.4* 32.7* 33.9*  MCV 90.8 90.0 91.3 91.4  PLT 314 321 337 427    Basic Metabolic Panel: Recent Labs  Lab 09/10/20 0628 09/12/20 0345 09/13/20 0430  NA 135 135 136  K 4.2 4.5 4.2  CL 98 97* 99  CO2 27 28 29   GLUCOSE 117* 122* 86  BUN 14 25* 23  CREATININE 0.94 0.88 0.85  CALCIUM 8.9 9.1 8.8*  MG  --  2.1 2.1    GFR: Estimated Creatinine Clearance: 53.5 mL/min (by C-G formula based on SCr of 0.85 mg/dL).  Liver Function Tests: Recent Labs  Lab  09/10/20 0628  AST 27  ALT 24  ALKPHOS 69  BILITOT 0.8  PROT 7.3  ALBUMIN 3.4*    CBG: Recent Labs  Lab 09/12/20 2357 09/13/20 0600  GLUCAP 103* 78     Recent Results (from the past 240 hour(s))  Resp Panel by RT-PCR (Flu A&B, Covid) Nasopharyngeal Swab     Status: None   Collection Time: 09/10/20  6:29 AM   Specimen: Nasopharyngeal Swab; Nasopharyngeal(NP) swabs in vial transport medium  Result Value Ref Range Status   SARS Coronavirus 2 by RT PCR NEGATIVE NEGATIVE Final    Comment: (NOTE) SARS-CoV-2 target nucleic acids are NOT DETECTED.  The SARS-CoV-2 RNA is generally detectable in upper respiratory specimens during the acute phase of infection. The lowest concentration of SARS-CoV-2 viral copies this assay can detect is 138 copies/mL. A negative result does not preclude SARS-Cov-2 infection and should not be used as the sole basis for treatment or other patient management decisions. A negative result may occur with  improper specimen collection/handling, submission of specimen other than nasopharyngeal swab, presence of viral mutation(s) within the areas targeted by this assay, and inadequate number of viral copies(<138 copies/mL). A negative result must be combined with clinical observations, patient history, and epidemiological information. The expected result is Negative.  Fact Sheet for Patients:  EntrepreneurPulse.com.au  Fact Sheet for Healthcare Providers:  IncredibleEmployment.be  This test is no t yet approved or cleared by the [place] FDA and  has been authorized for detection and/or diagnosis of SARS-CoV-2 by FDA under an Emergency Use Authorization (EUA). This EUA will remain  in effect (meaning this test can be used) for the duration of the COVID-19 declaration under Section 564(b)(1) of the Act, 21 U.S.C.section 360bbb-3(b)(1), unless the authorization is terminated  or revoked sooner.       Influenza  A by PCR NEGATIVE NEGATIVE Final   Influenza B by PCR NEGATIVE NEGATIVE Final    Comment: (NOTE) The Xpert Xpress SARS-CoV-2/FLU/RSV plus assay is intended as an aid in the diagnosis of influenza from Nasopharyngeal swab specimens and should not be used as a sole basis for treatment. Nasal washings and aspirates are unacceptable for Xpert Xpress SARS-CoV-2/FLU/RSV testing.  Fact Sheet for Patients: EntrepreneurPulse.com.au  Fact Sheet for Healthcare  Providers: IncredibleEmployment.be  This test is not yet approved or cleared by the [place] and has been authorized for detection and/or diagnosis of SARS-CoV-2 by FDA under an Emergency Use Authorization (EUA). This EUA will remain in effect (meaning this test can be used) for the duration of the COVID-19 declaration under Section 564(b)(1) of the Act, 21 U.S.C. section 360bbb-3(b)(1), unless the authorization is terminated or revoked.  Performed at [facility], [place] [address] [place], [place] [address]   Expectorated sputum assessment w rflx to resp cult     Status: None   Collection Time: 09/11/20  8:41 AM   Specimen: Expectorated Sputum  Result Value Ref Range Status   Specimen Description EXPECTORATED SPUTUM  Final   Special Requests SPU  Final   Sputum evaluation   Final    THIS SPECIMEN IS ACCEPTABLE FOR SPUTUM CULTURE Performed at [facility], [place] [address] [place], [place] [address]    Report Status 09/11/2020 FINAL  Final  Culture, respiratory     Status: None (Preliminary result)   Collection Time: 09/11/20  8:41 AM  Result Value Ref Range Status   Specimen Description   Final    EXPECTORATED SPUTUM Performed at [facility] [address], [place], [place] [address]    Special Requests   Final    SPU Reflexed from [id number] Performed at [facility], [place] [address], [place], [place]  [address]    Gram Stain   Final    RARE WBC PRESENT, PREDOMINANTLY PMN RARE GRAM POSITIVE COCCI    Culture   Final    CULTURE REINCUBATED FOR BETTER GROWTH Performed at [facility], [place] [address] [place], [place] [address]    Report Status PENDING  Incomplete         Radiology Studies: No results found.      Scheduled Meds: . aspirin EC  81 mg Oral Daily  . atorvastatin  80 mg Oral Daily  . budesonide (PULMICORT) nebulizer solution  0.5 mg Nebulization BID  . cilostazol  100 mg Oral BID  . clopidogrel  75 mg Oral Daily  . doxycycline  100 mg Oral Q12H  . enoxaparin (LOVENOX) injection  40 mg Subcutaneous Q24H  . famotidine  40 mg Oral QHS  . fluticasone  2 spray Each Nare Daily  . furosemide  40 mg Oral BID  . guaiFENesin  1,200 mg Oral BID  . ipratropium-albuterol  3 mL Nebulization TID  . levothyroxine  112 mcg Oral Q0600  . LORazepam  2 mg Oral QHS  . methylPREDNISolone (SOLU-MEDROL) injection  60 mg Intravenous Daily  . montelukast  10 mg Oral QHS  . pantoprazole  40 mg Oral Q0600  . PARoxetine  20 mg Oral Daily  . potassium chloride SA  40 mEq Oral BID  . tamsulosin  0.4 mg Oral Daily  . traZODone  100 mg Oral QHS   Continuous Infusions:   LOS: 3 days    Time spent: 40 minutes    Irine Seal, MD [facility]   To contact the attending provider between 7A-7P or the covering provider during after hours 7P-7A, please log into the web site [email or web address] and access using universal [place] password for that web site. If you do not have the password, please call the hospital operator.  09/13/2020, 11:02 AM

## 2020-09-13 NOTE — TOC Progression Note (Signed)
Transition of Care [facility]) - Progression Note    Patient Details  Name: TYE JUAREZ MRN: [id number] Date of Birth: [birth year]  Transition of Care [facility]) CM/SW [place], [place] Phone Number: 09/13/2020, 2:31 PM  Clinical Narrative:  Confirmed that patient is open to [facility] PT, OT services.  Will need orders at d/c for resumption of services. TOC will continue to follow during the course of hospitalization.      Expected Discharge Plan: [place] Barriers to Discharge: Continued Medical Work up  Expected Discharge Plan and Services Expected Discharge Plan: [place]   Discharge Planning Services: CM Consult   Living arrangements for the past 2 months: Single Family Home                                       Social Determinants of Health (SDOH) Interventions    Readmission Risk Interventions No flowsheet data found.

## 2020-09-13 NOTE — Care Management Important Message (Signed)
Important Message  Patient Details IM Letter given to the Patient. Name: [name] MRN: [id number] Date of Birth: May 26, 1946   Medicare Important Message Given:  Yes     Kerin Salen 09/13/2020, 10:25 AM

## 2020-09-13 NOTE — Progress Notes (Signed)
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 Donzetta Matters) who was recently hospitalized from 10/31-11/7 for acute on chronic hypoxic respiratory failure secondary to a COPD exacerbation as well as a mild systolic heart failure exacerbation who presents today via EMS from home with increasing shortness of breath for the past few days. admitted for COPD exac, CHF    PT Comments    Pt sitting EOB indep on arrival.  Assisted with amb in hallway.  General Gait Details: decreased amb distance this session due to increased cough spells and dyspnea.  Trial RA decreased to 87% with 3/4 dyspnea.  Reapplied 2 lts to achieve sats > 90%.  Distance limited by coughing. Pt admits to "furniture walking" in the home and recently received a wheelchair for community use to conserve energy.  "That's what I use to go to the grocery stair and appointments.  My husband pushes me around".    SATURATION QUALIFICATIONS: (This note is used to comply with regulatory documentation for home oxygen)  Patient Saturations on Room Air at Rest = 87%  Patient Saturations on Room Air while Ambulating = 85%  Patient Saturations on 2 Liters of oxygen while Ambulating = 90%  Please briefly explain why patient needs home oxygen:  Pt required supplemental oxygen during activity to achieve therapeutic level   Follow Up Recommendations  Home health PT     Equipment Recommendations   (recently received a wheelchair for community use for enegy conservation)    Recommendations for Other Services       Precautions / Restrictions Precautions Precaution Comments: O2 dep. Monitor O2  levels    Mobility  Bed Mobility Overal bed mobility: Modified Independent             General bed mobility comments: Patient found sitting EOB. Patient able to manuever in and out of bed without assistance during treatment.  Transfers Overall transfer level: Needs assistance Equipment used: None Transfers: Sit to/from [facility] Sit to Stand: Supervision Stand pivot transfers: Supervision       General transfer comment: good safety cognition and use of hands to steady self  Ambulation/Gait Ambulation/Gait assistance: Min guard;Supervision Gait Distance (Feet): 60 Feet (30 feet x 2) Assistive device: 4-wheeled walker Gait Pattern/deviations: Step-through pattern;Decreased stride length Gait velocity: decreased   General Gait Details: decreased amb distance this session due to increased cough spells and dyspnea.  Trial RA decreased to 87% with 3/4 dyspnea.  Reapplied 2 lts to achieve sats > 90%.  Distance limited by coughing.   Stairs             Wheelchair Mobility    Modified Rankin (Stroke Patients Only)       Balance                                            Cognition Arousal/Alertness: Awake/alert Behavior During Therapy: WFL for tasks assessed/performed Overall Cognitive Status: Within Functional Limits for tasks assessed  General Comments: Pleasant, cooperative, A&Ox4      Exercises      General Comments        Pertinent Vitals/Pain Pain Assessment: No/denies pain    Home Living                      Prior Function            PT Goals (current goals can now be found in the care plan section) Progress towards PT goals: Progressing toward goals    Frequency    Min 3X/week      PT Plan Current plan remains appropriate    Co-evaluation              AM-PAC PT "6 Clicks" Mobility   Outcome Measure  Help needed turning from your  back to your side while in a flat bed without using bedrails?: None Help needed moving from lying on your back to sitting on the side of a flat bed without using bedrails?: None Help needed moving to and from a bed to a chair (including a wheelchair)?: None Help needed standing up from a chair using your arms (e.g., wheelchair or bedside chair)?: A Little Help needed to walk in hospital room?: A Little Help needed climbing 3-5 steps with a railing? : A Little 6 Click Score: 21    End of Session Equipment Utilized During Treatment: Oxygen Activity Tolerance: Patient limited by fatigue Patient left: in bed;with call bell/phone within reach Nurse Communication: Mobility status PT Visit Diagnosis: Difficulty in walking, not elsewhere classified (R26.2);Unsteadiness on feet (R26.81)     Time: 2111-5520 PT Time Calculation (min) (ACUTE ONLY): 12 min  Charges:  $Gait Training: 8-22 mins                     Rica Koyanagi  PTA Acute  Rehabilitation Services Pager      [phone] Office      [phone]

## 2020-09-14 LAB — BASIC METABOLIC PANEL
Anion gap: 9 (ref 5–15)
BUN: 23 mg/dL (ref 8–23)
CO2: 29 mmol/L (ref 22–32)
Calcium: 8.7 mg/dL — ABNORMAL LOW (ref 8.9–10.3)
Chloride: 98 mmol/L (ref 98–111)
Creatinine, Ser: 0.84 mg/dL (ref 0.44–1.00)
GFR, Estimated: >60 mL/min (ref >60)
Glucose, Bld: 104 mg/dL — ABNORMAL HIGH (ref 70–99)
Potassium: 4.2 mmol/L (ref 3.5–5.1)
Sodium: 136 mmol/L (ref 135–145)

## 2020-09-14 LAB — MAGNESIUM: Magnesium: 2.2 mg/dL (ref 1.7–2.4)

## 2020-09-14 LAB — PHOSPHORUS: Phosphorus: 3.3 mg/dL (ref 2.5–4.6)

## 2020-09-14 NOTE — Progress Notes (Signed)
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 Donzetta Matters) who was recently hospitalized from 10/31-11/7 for acute on chronic hypoxic respiratory failure secondary to a COPD exacerbation as well as a mild systolic heart failure exacerbation who presents today via EMS from home with increasing shortness of breath for the past few days. admitted for COPD exac, CHF   OT comments  Patient progressing well with increased activity tolerance this session, patient encouraged was able to ambulate without coughing. Patient maintain 93% on 2L throughout session and ambulate ~150 ft x 2 with 2 standing rest breaks at supervision level. Continue to recommend acute OT services to maximize patient activity tolerance and further education regarding energy conservation in order to maximize patient independence at home.     Follow Up Recommendations  Home health OT;Supervision - Intermittent    Equipment Recommendations  Tub/shower seat       Precautions / Restrictions Precautions Precautions: Other (comment) Precaution Comments: O2 dep. Monitor O2 levels Restrictions Weight Bearing Restrictions: No       Mobility Bed Mobility               General bed mobility comments: seated EOB upon arrival  Transfers Overall transfer level: Needs assistance Equipment used: None Transfers: Sit to/from Stand Sit to Stand: Supervision              Balance Overall balance assessment: Needs assistance Sitting-balance support: No upper extremity supported;Feet supported Sitting balance-Leahy Scale: Normal     Standing balance support: No upper  extremity supported Standing balance-Leahy Scale: Fair Standing balance comment: static standing no UE support, requests to push portable tank for steadying assist with dynamic balance                           ADL either performed or assessed with clinical judgement   ADL Overall ADL's : Needs assistance/impaired                         Toilet Transfer: Supervision/safety;Ambulation Toilet Transfer Details (indicate cue type and reason): patient participate in functional ambulation at supervision level, no loss of balance noted. steadying assist from portable O2 tank.         Functional mobility during ADLs: Supervision/safety General ADL Comments: session focused on increasing activity tolerance necessary to participate in daily routine at home, patient ambulate ~ 142ft x2 with 2 standing rest breaks and O2 saturations maintained 93% on 2L. Patient encouraged she did not having any coughing fits today, continue to encourage daily mobilization including seated exercise and ambulation to build activity tolerance and strength as patient states her LEs feel weak               Cognition Arousal/Alertness: Awake/alert Behavior During Therapy: WFL for tasks assessed/performed Overall Cognitive Status: Within Functional Limits for tasks assessed                                                     Pertinent  Vitals/ Pain       Pain Assessment: No/denies pain         Frequency  Min 2X/week        Progress Toward Goals  OT Goals(current goals can now be found in the care plan section)  Progress towards OT goals: Progressing toward goals  Acute Rehab OT Goals Patient Stated Goal: return to being independent at home OT Goal Formulation: With patient Time For Goal Achievement: 09/19/20 Potential to Achieve Goals: Good ADL Goals Additional ADL Goal #1: Pt will walk to bathroom and toilet on high commode with mod I. Additional ADL  Goal #2: Pt will gather all clothes and dress self using energy conservation techniques with mod I. Additional ADL Goal #3: Pt will state 3 things she can implement at home to assist with conserving energy during adls Ily.  Plan Discharge plan remains appropriate       AM-PAC OT "6 Clicks" Daily Activity     Outcome Measure   Help from another person eating meals?: None Help from another person taking care of personal grooming?: None Help from another person toileting, which includes using toliet, bedpan, or urinal?: A Little Help from another person bathing (including washing, rinsing, drying)?: A Little Help from another person to put on and taking off regular upper body clothing?: None Help from another person to put on and taking off regular lower body clothing?: A Little 6 Click Score: 21    End of Session Equipment Utilized During Treatment: Oxygen  OT Visit Diagnosis: Unsteadiness on feet (R26.81)   Activity Tolerance Patient tolerated treatment well   Patient Left in bed;with call bell/phone within reach   Nurse Communication Mobility status        Time: 4287-6811 OT Time Calculation (min): 13 min  Charges: OT General Charges $OT Visit: 1 Visit OT Treatments $Self Care/Home Management : 8-22 mins  Delbert Phenix OT OT pager: [place] 09/14/2020, 3:14 PM

## 2020-09-14 NOTE — Plan of Care (Signed)
  Problem: Activity: Goal: Risk for activity intolerance will decrease Outcome: Progressing   Problem: Pain Managment: Goal: General experience of comfort will improve Outcome: Progressing   Problem: Respiratory: Goal: Levels of oxygenation will improve Outcome: Progressing   Problem: Respiratory: Goal: Ability to maintain adequate ventilation will improve Outcome: Progressing

## 2020-09-14 NOTE — Progress Notes (Signed)
PROGRESS NOTE    [name]  KNL:[id number] DOB: 01-13-1946 DOA: 09/10/2020 PCP: [place], [name], MD    Chief Complaint  Patient presents with  . Respiratory Distress    Brief Narrative:  HPI per Dr. 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 Donzetta Matters) who was recently hospitalized from 10/31-11/7 for acute on chronic hypoxic respiratory failure secondary to a COPD exacerbation as well as a mild systolic heart failure exacerbation who presents today via EMS from home with increasing shortness of breath for the past few days.  When she was discharged from the hospital most recently she was advised to use for liters at rest and increased to 6 LPM on exertion however the patient states she has been remaining at 4 L and has had minimal exertion.  Since discharge she has had worsening orthopnea as well.  Patient called her PCP yesterday with complaints of feeling "terrible "with shortness of breath and complained of vomiting and diarrhea earlier in the week.  However, states that she has chronic diarrhea that she uses antidiarrheals for.  She was advised to go to the ED yesterday but did not wish to go and her Lasix dose was increased for a short period of time from 80 mg to 120 however the patient was unaware of this and did not increase her Lasix dose.  Due to her increasing shortness of breath today she called EMS.  She was given 2 nitroglycerin by EMS and was noted to be hypoxic at 87% on baseline 4 LPM and placed on NRB in route.  Also complaining of right-sided sharp pain in the midaxillary line with inspiration which she has had in the past.  Denies missing any doses of her medication, denies any increased salt intake.  States that she is had a 4 pound weight gain in the  past 1 week and has had slight abdominal distention recently.  Denies any fever.  States that her symptoms are similar to all her previous hospitalizations   ED Course: Afebrile, tachycardic, tachypneic,  on 4 L/min. Notable Labs: BNP 145, troponin 28-> 29, WBC 13.6, Hb 11.5, COVID-19 negative. Notable Imaging: CXR with patchy nodular airspace infiltrates in right mid and both lower lungs. Patient received Solu-Medrol and albuterol.    Assessment & Plan:   Principal Problem:   COPD exacerbation ([place]) Active Problems:   Hypothyroidism   Chronic mesenteric ischemia (HCC)   CAD (coronary artery disease)   Acute on chronic respiratory failure with hypoxia (HCC)   OSA (obstructive sleep apnea)   SOB (shortness of breath)   Acute on chronic systolic CHF (congestive heart failure) (HCC)  1 acute on chronic respiratory failure with hypoxia multifactorial secondary to acute COPD exacerbation and suspected mild acute systolic CHF exacerbation Patient recently hospitalized for similar episode of COPD exacerbation and acute systolic heart failure.  Patient noted to have increased O2 requirements on presentation.  Patient endorsed being the presence of a current smoker.  Oxygen requirement is at baseline of 2 L by nasal cannula.  Patient still with shortness of breath with diffuse wheezing which is slowly improving. Will administer Solu-Medrol at 40 mg every 8 hourly x24 hours.  Continue Pulmicort, duo nebs, PPI, Mucinex, Singulair, doxycycline, Pepcid.  Supportive care.   Will need close outpatient follow-up with pulmonary.  2.  Acute COPD exacerbation Patient with diffuse wheezing.  Poor to fair air movement.  Was on oral prednisone which has been discontinued.  Patient still with diffuse wheezing decrease IV Solu-Medrol to 60 mg daily.  Continue doxycycline, Pulmicort, scheduled duo nebs, PPI, Mucinex.  Will need close outpatient follow-up with pulmonary.   3.  Mild acute on chronic systolic  CHF exacerbation BNP minimally elevated however patient noted to have orthopnea and a 4 pound weight gain per patient.  Urine output of 3.8 L over the past 24 hours.  Was on IV Lasix and has been transitioned to oral Lasix.  Continue statin, aspirin, Plavix.  Outpatient follow-up.   4.  GERD Continue PPI, Pepcid.  GI cocktail as needed.  Follow.    5.  Coronary artery disease Stable.  Continue aspirin, Plavix, statin, diuretics.  Outpatient follow-up.    6.  Chronic mesenteric ischemia Continue Plavix, Pletal.  7.  Hypothyroidism Synthroid.   8.  OSA Patient recently diagnosed with OSA via sleep study (07/27/2020) however patient stated has not had a follow-up since sleep study date.  Patient tolerating CPAP while in-house.  Will need outpatient follow-up with PCP/pulmonary in order to set up patient with outpatient CPAP.  9.  Urinary retention/urinary difficulty Patient with complaints of urinary difficulty and has to bear down and states urine just trickles out.  Patient started on Flomax 09/12/2020 which she seems to have tolerated however unsure whether there was improvement with her urinary symptoms.  Patient however noted to have a urine output of 3.8 L over the past 24 hours.  Will need outpatient follow-up with urology.   Obtain ultrasound of pelvis   DVT prophylaxis: Lovenox Code Status: DNR Family Communication: Updated patient.  No family at bedside.  Disposition:   Status is: Inpatient    Dispo: The patient is from: Home              Anticipated d/c is to: Home              Anticipated d/c date is: 2 to 3 days days.              Patient currently in acute on chronic respiratory failure, on IV steroids taper, antibiotics, diuretics.  Not stable for discharge.       Consultants:   None  Procedures:   Chest x-ray 09/10/2020    Antimicrobials:  Doxycycline 09/10/2020   Subjective: She is seen and examined at bedside.  Sitting up in the bed.  Still have  global wheezing and shortness of breath.  Currently on 2 L by nasal cannula.  Denies any chest pain, palpitation or diaphoresis.  She is on Flomax for suspected urinary retention.  Will obtain ultrasound of the pelvis/bladder.   Objective: Vitals:   09/14/20 0820 09/14/20 1105 09/14/20 1108 09/14/20 1349  BP:  118/75  116/70  Pulse:  76  80  Resp:  20 17 18   Temp:    98.4 F (36.9 C)  TempSrc:    Oral  SpO2: 92% 95%  93%  Weight:      Height:        Intake/Output Summary (Last 24 hours) at 09/14/2020 1735 Last data filed at 09/13/2020 1855 Gross per 24 hour  Intake --  Output 400 ml  Net -400 ml   Filed Weights   09/11/20 0500 09/13/20 0500 09/13/20 1729  Weight: 63.3 kg 64.4 kg 57.3 kg    Examination:  General exam: Not in acute  respiratory distress.  On 2 L O2 by nasal cannula Respiratory system: Bilateral global wheezing with diminished air entry.  Cardiovascular system: Regular rate rhythm no murmurs rubs or gallops.  No JVD.  No lower extremity edema. Gastrointestinal system: Abdomen is soft, nontender, nondistended, positive bowel sounds.  No rebound.  No guarding. Central nervous system: Alert and oriented. No focal neurological deficits. Extremities: Symmetric 5 x 5 power. Skin: No rashes, lesions or ulcers Psychiatry: Judgement and insight appear normal. Mood & affect appropriate.     Data Reviewed: I have personally reviewed following labs and imaging studies  CBC: Recent Labs  Lab 09/10/20 0628 09/11/20 0527 09/12/20 0345 09/13/20 0430  WBC 13.6* 9.7 7.7 4.6  HGB 11.5* 10.8* 10.7* 11.0*  HCT 34.4* 32.4* 32.7* 33.9*  MCV 90.8 90.0 91.3 91.4  PLT 314 321 337 761    Basic Metabolic Panel: Recent Labs  Lab 09/10/20 0628 09/12/20 0345 09/13/20 0430 09/14/20 0439  NA 135 135 136 136  K 4.2 4.5 4.2 4.2  CL 98 97* 99 98  CO2 27 28 29 29   GLUCOSE 117* 122* 86 104*  BUN 14 25* 23 23  CREATININE 0.94 0.88 0.85 0.84  CALCIUM 8.9 9.1 8.8* 8.7*  MG   --  2.1 2.1  --     GFR: Estimated Creatinine Clearance: 52.3 mL/min (by C-G formula based on SCr of 0.84 mg/dL).  Liver Function Tests: Recent Labs  Lab 09/10/20 0628  AST 27  ALT 24  ALKPHOS 69  BILITOT 0.8  PROT 7.3  ALBUMIN 3.4*    CBG: Recent Labs  Lab 09/12/20 2357 09/13/20 0600  GLUCAP 103* 78     Recent Results (from the past 240 hour(s))  Resp Panel by RT-PCR (Flu A&B, Covid) Nasopharyngeal Swab     Status: None   Collection Time: 09/10/20  6:29 AM   Specimen: Nasopharyngeal Swab; Nasopharyngeal(NP) swabs in vial transport medium  Result Value Ref Range Status   SARS Coronavirus 2 by RT PCR NEGATIVE NEGATIVE Final    Comment: (NOTE) SARS-CoV-2 target nucleic acids are NOT DETECTED.  The SARS-CoV-2 RNA is generally detectable in upper respiratory specimens during the acute phase of infection. The lowest concentration of SARS-CoV-2 viral copies this assay can detect is 138 copies/mL. A negative result does not preclude SARS-Cov-2 infection and should not be used as the sole basis for treatment or other patient management decisions. A negative result may occur with  improper specimen collection/handling, submission of specimen other than nasopharyngeal swab, presence of viral mutation(s) within the areas targeted by this assay, and inadequate number of viral copies(<138 copies/mL). A negative result must be combined with clinical observations, patient history, and epidemiological information. The expected result is Negative.  Fact Sheet for Patients:  EntrepreneurPulse.com.au  Fact Sheet for Healthcare Providers:  IncredibleEmployment.be  This test is no t yet approved or cleared by the [place] FDA and  has been authorized for detection and/or diagnosis of SARS-CoV-2 by FDA under an Emergency Use Authorization (EUA). This EUA will remain  in effect (meaning this test can be used) for the duration of  the COVID-19 declaration under Section 564(b)(1) of the Act, 21 U.S.C.section 360bbb-3(b)(1), unless the authorization is terminated  or revoked sooner.       Influenza A by PCR NEGATIVE NEGATIVE Final   Influenza B by PCR NEGATIVE NEGATIVE Final    Comment: (NOTE) The Xpert Xpress SARS-CoV-2/FLU/RSV plus assay is intended as an aid in the diagnosis of influenza from  Nasopharyngeal swab specimens and should not be used as a sole basis for treatment. Nasal washings and aspirates are unacceptable for Xpert Xpress SARS-CoV-2/FLU/RSV testing.  Fact Sheet for Patients: EntrepreneurPulse.com.au  Fact Sheet for Healthcare Providers: IncredibleEmployment.be  This test is not yet approved or cleared by the [place] FDA and has been authorized for detection and/or diagnosis of SARS-CoV-2 by FDA under an Emergency Use Authorization (EUA). This EUA will remain in effect (meaning this test can be used) for the duration of the COVID-19 declaration under Section 564(b)(1) of the Act, 21 U.S.C. section 360bbb-3(b)(1), unless the authorization is terminated or revoked.  Performed at [facility], [place] [address], [place], [place] [address]   Expectorated sputum assessment w rflx to resp cult     Status: None   Collection Time: 09/11/20  8:41 AM   Specimen: Expectorated Sputum  Result Value Ref Range Status   Specimen Description EXPECTORATED SPUTUM  Final   Special Requests SPU  Final   Sputum evaluation   Final    THIS SPECIMEN IS ACCEPTABLE FOR SPUTUM CULTURE Performed at [facility], [place] [address] [place], [place] [address]    Report Status 09/11/2020 FINAL  Final  Culture, respiratory     Status: None   Collection Time: 09/11/20  8:41 AM  Result Value Ref Range Status   Specimen Description   Final    EXPECTORATED SPUTUM Performed at [employer] [facility] [address] [place], [place]  [address]    Special Requests   Final    SPU Reflexed from [id number] Performed at [facility], [place] [address] [place], [place] [address]    Gram Stain   Final    RARE WBC PRESENT, PREDOMINANTLY PMN RARE GRAM POSITIVE COCCI    Culture   Final    FEW Consistent with normal respiratory flora. No Pseudomonas species isolated Performed at [place] [address] [place], [place] [address]    Report Status 09/13/2020 FINAL  Final         Radiology Studies: No results found.      Scheduled Meds: . aspirin EC  81 mg Oral Daily  . atorvastatin  80 mg Oral Daily  . budesonide (PULMICORT) nebulizer solution  0.5 mg Nebulization BID  . cilostazol  100 mg Oral BID  . clopidogrel  75 mg Oral Daily  . doxycycline  100 mg Oral Q12H  . enoxaparin (LOVENOX) injection  40 mg Subcutaneous Q24H  . famotidine  40 mg Oral QHS  . fluticasone  2 spray Each Nare Daily  . furosemide  40 mg Oral BID  . guaiFENesin  1,200 mg Oral BID  . ipratropium-albuterol  3 mL Nebulization TID  . levothyroxine  112 mcg Oral Q0600  . LORazepam  2 mg Oral QHS  . methylPREDNISolone (SOLU-MEDROL) injection  60 mg Intravenous Daily  . montelukast  10 mg Oral QHS  . pantoprazole  40 mg Oral Q0600  . PARoxetine  20 mg Oral Daily  . potassium chloride SA  40 mEq Oral BID  . tamsulosin  0.4 mg Oral Daily  . traZODone  100 mg Oral QHS   Continuous Infusions:   LOS: 4 days    Time spent: 38 minutes    Elie Confer, MD [facility] Pager: [phone]   To contact the attending provider between 7A-7P or the covering provider during after hours 7P-7A, please log into the web site [email or web address] and access using  universal [place] password for that web site. If you do not have the password, please call the hospital operator.  09/14/2020, 5:35 PM

## 2020-09-15 ENCOUNTER — Inpatient Hospital Stay (HOSPITAL_COMMUNITY): Payer: Medicare Other

## 2020-09-15 ENCOUNTER — Other Ambulatory Visit (HOSPITAL_COMMUNITY): Payer: Self-pay | Admitting: Internal Medicine

## 2020-09-15 LAB — COMPREHENSIVE METABOLIC PANEL
ALT: 22 U/L (ref 0–44)
AST: 17 U/L (ref 15–41)
Albumin: 3.2 g/dL — ABNORMAL LOW (ref 3.5–5.0)
Alkaline Phosphatase: 52 U/L (ref 38–126)
Anion gap: 9 (ref 5–15)
BUN: 22 mg/dL (ref 8–23)
CO2: 29 mmol/L (ref 22–32)
Calcium: 8.8 mg/dL — ABNORMAL LOW (ref 8.9–10.3)
Chloride: 98 mmol/L (ref 98–111)
Creatinine, Ser: 0.85 mg/dL (ref 0.44–1.00)
GFR, Estimated: >60 mL/min (ref >60)
Glucose, Bld: 92 mg/dL (ref 70–99)
Potassium: 4.2 mmol/L (ref 3.5–5.1)
Sodium: 136 mmol/L (ref 135–145)
Total Bilirubin: 0.6 mg/dL (ref 0.3–1.2)
Total Protein: 6.2 g/dL — ABNORMAL LOW (ref 6.5–8.1)

## 2020-09-15 LAB — CBC
HCT: 35 % — ABNORMAL LOW (ref 36.0–46.0)
Hemoglobin: 11.7 g/dL — ABNORMAL LOW (ref 12.0–15.0)
MCH: 30.4 pg (ref 26.0–34.0)
MCHC: 33.4 g/dL (ref 30.0–36.0)
MCV: 90.9 fL (ref 80.0–100.0)
Platelets: 480 10*3/uL — ABNORMAL HIGH (ref 150–400)
RBC: 3.85 MIL/uL — ABNORMAL LOW (ref 3.87–5.11)
RDW: 13.7 % (ref 11.5–15.5)
WBC: 5.5 10*3/uL (ref 4.0–10.5)
nRBC: 0 % (ref 0.0–0.2)

## 2020-09-15 MED ORDER — PREDNISONE 10 MG PO TABS: 10.0000 mg | ORAL_TABLET | Freq: Every day | ORAL | 0 refills | Status: DC

## 2020-09-15 MED ORDER — DOXYCYCLINE HYCLATE 100 MG PO TABS: 100.0000 mg | ORAL_TABLET | Freq: Two times a day (BID) | ORAL | 0 refills | Status: DC

## 2020-09-15 MED ORDER — HYDROCODONE-HOMATROPINE 5-1.5 MG/5ML PO SYRP: 5.0000 mL | ORAL_SOLUTION | Freq: Four times a day (QID) | ORAL | 0 refills | Status: DC | PRN

## 2020-09-15 MED ORDER — BUDESONIDE 0.5 MG/2ML IN SUSP: 0.5000 mg | Freq: Two times a day (BID) | RESPIRATORY_TRACT | 12 refills | Status: DC

## 2020-09-15 MED ORDER — TAMSULOSIN HCL 0.4 MG PO CAPS: 0.4000 mg | ORAL_CAPSULE | Freq: Every day | ORAL | 0 refills | Status: DC

## 2020-09-15 MED FILL — HYDROCODONE-HOMATROPINE SYR: 5-1.5 | 6 days supply | Qty: 120 | Fill #0

## 2020-09-15 MED FILL — predniSONE 10 MG TABS: 10 | 30 days supply | Qty: 30 | Fill #0

## 2020-09-15 MED FILL — DOXYCYCLINE HYCLATE 100 MG: 100 | 14 days supply | Qty: 28 | Fill #0

## 2020-09-15 MED FILL — BUDESONIDE 0.5 MG/2ML SUSP: 0.5 | 15 days supply | Qty: 60 | Fill #0

## 2020-09-15 MED FILL — TAMSULOSIN HCL 0.4 MG CAP: 0.4 | 30 days supply | Qty: 30 | Fill #0

## 2020-09-15 NOTE — Progress Notes (Signed)
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 Donzetta Matters) who was recently hospitalized from 10/31-11/7 for acute on chronic hypoxic respiratory failure secondary to a COPD exacerbation as well as a mild systolic heart failure exacerbation who presents today via EMS from home with increasing shortness of breath for the past few days. admitted for COPD exac, CHF    PT Comments    Pt received awake and independent on EOB. Beginning vitals in sitting: 93% O2 on RA, 88 HR, 121/80. She is supervision to stand; independent with static and dynamic standing balance while gait belt is being donned. She is min G for safety with ambulation in hallway with 4WW. 4WW used in case pt requires rest breaks; she claims she does not use an A/D for walking in the home. Presents with kyphotic posture. Satting between 99% and 87% O2 on RA during 222ft walk, asymptomatic. 99%-98% O2 midway through walk, while turning at end of hallway. 87% O2 towards end of ambulation, and only briefly. No c/o fatigue or SOB. No coughing noted or need for rest breaks. Supervision for standing pivot and to sit EOB. Sitting and final O2 sat at 91%.  Follow Up Recommendations  Home health PT     Equipment Recommendations       Recommendations for Other Services       Precautions / Restrictions Precautions Precautions: Other (comment) Precaution Comments: O2 dep. Monitor O2 levels Restrictions Weight Bearing Restrictions: No    Mobility  Bed Mobility Overal bed mobility: Modified Independent             General bed mobility comments: seated EOB upon  arrival  Transfers Overall transfer level: Needs assistance Equipment used: None Transfers: Sit to/from Stand Sit to Stand: Supervision Stand pivot transfers: Supervision       General transfer comment: good safety cognition, requires no additional time  Ambulation/Gait Ambulation/Gait assistance: Min guard;Supervision Gait Distance (Feet): 200 Feet Assistive device: 4-wheeled walker Gait Pattern/deviations: Step-through pattern;Decreased stride length Gait velocity: decreased   General Gait Details: Completed distance with no required rest breaks; O2 between 99% and 87%, however, briefly at 87% and asymptomatic. No coughing. On RA   Stairs             Wheelchair Mobility    Modified Rankin (Stroke Patients Only)       Balance                                            Cognition Arousal/Alertness: Awake/alert Behavior During Therapy: WFL for tasks assessed/performed Overall Cognitive Status: Within Functional Limits for tasks assessed                                 General Comments: Pleasant, cooperative, A&Ox4      Exercises      General Comments        Pertinent Vitals/Pain Pain Assessment: No/denies pain    Home Living  Prior Function            PT Goals (current goals can now be found in the care plan section) Acute Rehab PT Goals Patient Stated Goal: return to being independent at home PT Goal Formulation: With patient Time For Goal Achievement: 09/25/20 Potential to Achieve Goals: Good Progress towards PT goals: Progressing toward goals    Frequency    Min 3X/week      PT Plan Current plan remains appropriate    Co-evaluation              AM-PAC PT "6 Clicks" Mobility   Outcome Measure  Help needed turning from your back to your side while in a flat bed without using bedrails?: None Help needed moving from lying on your back to sitting on the side of a  flat bed without using bedrails?: None Help needed moving to and from a bed to a chair (including a wheelchair)?: None Help needed standing up from a chair using your arms (e.g., wheelchair or bedside chair)?: None Help needed to walk in hospital room?: None Help needed climbing 3-5 steps with a railing? : A Little 6 Click Score: 23    End of Session Equipment Utilized During Treatment: Gait belt   Patient left: in bed;with call bell/phone within reach   PT Visit Diagnosis: Difficulty in walking, not elsewhere classified (R26.2);Unsteadiness on feet (R26.81)     Time:  -     Charges:                        Juel Burrow, [place] [facility] [phone]

## 2020-09-15 NOTE — Plan of Care (Signed)
  Problem: Education: Goal: Knowledge of General Education information will improve Description: Including pain rating scale, medication(s)/side effects and non-pharmacologic comfort measures Outcome: Adequate for Discharge   Problem: Activity: Goal: Risk for activity intolerance will decrease Outcome: Adequate for Discharge   Problem: Pain Managment: Goal: General experience of comfort will improve Outcome: Adequate for Discharge   Problem: Safety: Goal: Ability to remain free from injury will improve Outcome: Adequate for Discharge   Problem: Education: Goal: Knowledge of disease or condition will improve Outcome: Adequate for Discharge Goal: Knowledge of the prescribed therapeutic regimen will improve Outcome: Adequate for Discharge Goal: Individualized Educational Video(s) Outcome: Adequate for Discharge   Problem: Activity: Goal: Ability to tolerate increased activity will improve Outcome: Adequate for Discharge Goal: Will verbalize the importance of balancing activity with adequate rest periods Outcome: Adequate for Discharge   Problem: Respiratory: Goal: Ability to maintain a clear airway will improve Outcome: Adequate for Discharge Goal: Levels of oxygenation will improve Outcome: Adequate for Discharge Goal: Ability to maintain adequate ventilation will improve Outcome: Adequate for Discharge

## 2020-09-15 NOTE — Discharge Summary (Signed)
Physician Discharge Summary  Patient ID: [name] MRN: [id number] DOB/AGE: [birth year] 74 y.o.  Admit date: 09/10/2020 Discharge date: 09/15/2020  Admission Diagnoses:  Discharge Diagnoses:  Principal Problem:   COPD exacerbation ([place]) Active Problems:   Hypothyroidism   Chronic mesenteric ischemia (HCC)   CAD (coronary artery disease)   Acute on chronic respiratory failure with hypoxia (HCC)   OSA (obstructive sleep apnea)   SOB (shortness of breath)   Acute on chronic systolic CHF (congestive heart failure) ([place])   Discharged Condition: Stable  Hospital Course:  There is a 74 year old female with past medical history of COPD on supplemental O2 at night, right-sided breast cancer in remission, chronic mesenteric ischemia, CAD, HFrEF, splenic infarct in 2012, 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 Donzetta Matters) who was recently hospitalized from 10/31-11/7 for acute on chronic hypoxic respiratory failure secondary to a COPD exacerbation as well as a mild systolic heart failure exacerbation who presents today via EMS from home with increasing shortness of breath for the past few days.When she was discharged from the hospital most recently she was advised to use for liters at rest and increased to 6 LPM on exertion however the patient states she has been remaining at 4 L and has had minimal exertion. Since discharge she has had worsening orthopnea as well. Patient called her PCP yesterday with complaints of feeling "terrible "with shortness of breath and complained of vomiting and diarrhea earlier in the week.However, states that she has chronic diarrhea that she uses antidiarrheals for. She was advised to go to the ED yesterday but did not wish to go and her Lasix dose was increased for a short period of time from 80 mg to 120however the patient was unaware of this and did not increase her Lasix dose. Due to her increasing shortness of breathtodayshe called  EMS. She was given 2 nitroglycerin by EMS and was noted to be hypoxic at 87% on baseline 4 LPM and placed on NRB in route. Also complaining of right-sided sharp painin the midaxillary line with inspiration which she has had in the past. Denies missing any doses of her medication, denies any increased salt intake. States that she is had a 4 pound weight gain in the past 1 week and has had slight abdominal distention recently. Denies any fever. States that her symptoms are similar to all her previous hospitalizations  Principal Problem:   COPD exacerbation ([place]) Active Problems:   Hypothyroidism   Chronic mesenteric ischemia (HCC)   CAD (coronary artery disease)   Acute on chronic respiratory failure with hypoxia (HCC)   OSA (obstructive sleep apnea)   SOB (shortness of breath)   Acute on chronic systolic CHF (congestive heart failure) (HCC)  1 acute on chronic respiratory failure with hypoxia multifactorial secondary to acute COPD exacerbation and suspected mild acute systolic CHF exacerbation Patient recently hospitalized for similar episode of COPD exacerbation and acute systolic heart failure.  Patient noted to have increased O2 requirements on presentation.  Patient endorsed being the presence of a current smoker.  Oxygen requirement is at baseline of 2 L by nasal cannula.  Patient still with shortness of breath with diffuse wheezing which is slowly improving. IV Solu-Medrol discontinued and patient started on until evaluated by her primary pulmonologist. She is discharged with Pulmicort nebulizer and doxycycline 100 mg twice daily for 2 weeks. She is to continue with her rescue inhalers along with Singulair  2.  Acute COPD exacerbation Patient with diffuse wheezing.  Poor to fair air movement.  Was on oral prednisone which has been discontinued.  Patient still with diffuse wheezing decrease IV Solu-Medrol to 60 mg daily.  Continue doxycycline, Pulmicort, scheduled duo nebs, PPI,  Mucinex.  Will need close outpatient follow-up with pulmonary.   3.  Mild acute on chronic systolic CHF exacerbation BNP minimally elevated however patient noted to have orthopnea and a 4 pound weight gain per patient.  Urine output of 3.8 L over the past 24 hours.  Was on IV Lasix and has been transitioned to oral Lasix.  Continue statin, aspirin, Plavix.  Outpatient follow-up.   4.  GERD Continue PPI, Pepcid.  GI cocktail as needed.  Follow.    5.  Coronary artery disease Stable.  Continue aspirin, Plavix, statin, diuretics.  Outpatient follow-up.    6.  Chronic mesenteric ischemia Continue Plavix, Pletal.  7.  Hypothyroidism Synthroid.   8.  OSA Patient recently diagnosed with OSA via sleep study (07/27/2020) however patient stated has not had a follow-up since sleep study date.  Patient tolerating CPAP while in-house.  Will need outpatient follow-up with PCP/pulmonary in order to set up patient with outpatient CPAP.  9.  Urinary retention/urinary difficulty Patient with complaints of urinary difficulty and has to bear down and states urine just trickles out.  Patient started on Flomax 09/12/2020 which she seems to have tolerated however unsure whether there was improvement with her urinary symptoms.  Patient however noted to have a urine output of 3.8 L over the past 24 hours.  Will need outpatient follow-up with urology.      Consults: None  Significant Diagnostic Studies: Chest x-ray which showed patchy right middle and lower lobe infiltrates.  Repeat chest x-ray showed resolution of infiltrates  Treatments: Oxygen supplementation, doxycycline, bronchodilators and steroids  Discharge Exam: Blood pressure 120/84, pulse 76, temperature 98.7 F (37.1 C), resp. rate 20, height 5\' 6"  (1.676 m), weight 58.2 kg, SpO2 97 %. General appearance: alert, cooperative and appears stated age Eyes: conjunctivae/corneas clear. PERRL, EOM's intact. Fundi benign. Nose: Nares normal.  Septum midline. Mucosa normal. No drainage or sinus tenderness. Resp: wheezes bilaterally Chest wall: no tenderness Cardio: regular rate and rhythm, S1, S2 normal, no murmur, click, rub or gallop Extremities: extremities normal, atraumatic, no cyanosis or edema Pulses: 2+ and symmetric Skin: Skin color, texture, turgor normal. No rashes or lesions Neurologic: Alert and oriented X 3, normal strength and tone. Normal symmetric reflexes. Normal coordination and gait  Disposition: Discharge disposition: 01-Home or Self Care       Discharge Instructions    Diet - low sodium heart healthy   Complete by: As directed    Discharge instructions   Complete by: As directed    Follow-up with your primary care and pulmonologist in 1 week Avoid secondhand smoking or smoking.   Face-to-face encounter (required for Medicare/Medicaid patients)   Complete by: As directed    I Elie Confer certify that this patient is under my care and that I, or a nurse practitioner or physician's assistant working with me, had a face-to-face encounter that meets the physician face-to-face encounter requirements with this patient on 09/15/2020. The encounter with the patient was in whole, or in part for the following medical condition(s) which is the primary reason for home health care (List medical condition): Chronic obstructive pulmonary disease with recurrent acute exacerbations   The encounter with the patient was in whole, or in part, for the following medical condition, which is the primary  reason for home health care: Chronic obstructive pulmonary disease with recurrent exacerbation   I certify that, based on my findings, the following services are medically necessary home health services: Nursing   Reason for Medically Necessary Home Health Services: Skilled Nursing- Skilled Assessment/Observation   My clinical findings support the need for the above services: Shortness of breath with activity   Further, I  certify that my clinical findings support that this patient is homebound due to: Ambulates short distances less than 300 feet   Home Health   Complete by: As directed    To provide the following care/treatments:  [place] work Therapist, sports     Increase activity slowly   Complete by: As directed      Allergies as of 09/15/2020      Reactions   Sulfa Antibiotics Swelling      Medication List    STOP taking these medications   famotidine 40 MG tablet Commonly known as: PEPCID     TAKE these medications   acetaminophen 325 MG tablet Commonly known as: TYLENOL Take 2 tablets (650 mg total) by mouth every 6 (six) hours as needed for mild pain (or Fever >/= 101).   albuterol 108 (90 Base) MCG/ACT inhaler Commonly known as: VENTOLIN HFA TAKE 2 PUFFS BY MOUTH EVERY 6 HOURS AS NEEDED FOR WHEEZE OR SHORTNESS OF BREATH What changed: See the new instructions.   aspirin EC 81 MG tablet Take 1 tablet (81 mg total) by mouth daily.   atorvastatin 80 MG tablet Commonly known as: LIPITOR TAKE 1 TABLET BY MOUTH EVERY DAY   benzonatate 100 MG capsule Commonly known as: Tessalon Perles Take 1 capsule (100 mg total) by mouth 3 (three) times daily as needed for cough.   Biotin 10000 MCG Tabs Take 10,000 mcg by mouth daily.   budesonide 0.5 MG/2ML nebulizer solution Commonly known as: PULMICORT Take 2 mLs (0.5 mg total) by nebulization 2 (two) times daily.   cilostazol 100 MG tablet Commonly known as: PLETAL Take 1 tablet (100 mg total) by mouth 2 (two) times daily.   clopidogrel 75 MG tablet Commonly known as: Plavix Take 1 tablet (75 mg total) by mouth daily.   dextromethorphan-guaiFENesin 30-600 MG 12hr tablet Commonly known as: MUCINEX DM Take 1 tablet by mouth 2 (two) times daily as needed for cough.   doxycycline 100 MG tablet Commonly known as: VIBRA-TABS Take 1 tablet (100 mg total) by mouth 2 (two) times daily for 14 days.   fluticasone 50 MCG/ACT nasal  spray Commonly known as: FLONASE USE 1 SPRAY INTO EACH NOSTRIL TWICE A DAY What changed: See the new instructions.   furosemide 40 MG tablet Commonly known as: LASIX Take 1 tablet (40 mg total) by mouth 2 (two) times daily.   HYDROcodone-homatropine 5-1.5 MG/5ML syrup Commonly known as: HYCODAN Take 5 mLs by mouth every 6 (six) hours as needed for cough.   ipratropium-albuterol 0.5-2.5 (3) MG/3ML Soln Commonly known as: DUONEB Inhale 3 mLs into the lungs every 6 (six) hours as needed (sob, wheezing).   levothyroxine 112 MCG tablet Commonly known as: SYNTHROID TAKE 1 TABLET (112 MCG TOTAL) BY MOUTH DAILY BEFORE BREAKFAST.   LORazepam 2 MG tablet Commonly known as: ATIVAN Take 1 tablet (2 mg total) by mouth at bedtime.   montelukast 10 MG tablet Commonly known as: SINGULAIR Take 1 tablet (10 mg total) by mouth at bedtime.   pantoprazole 40 MG tablet Commonly known as: PROTONIX Take 1 tablet (40 mg  total) by mouth daily.   PARoxetine 20 MG tablet Commonly known as: PAXIL TAKE 1 TABLET BY MOUTH EVERYDAY AT BEDTIME What changed:   how much to take  how to take this  when to take this   potassium chloride SA 20 MEQ tablet Commonly known as: KLOR-CON Take 2 tablets (40 mEq total) by mouth 2 (two) times daily.   predniSONE 10 MG tablet Commonly known as: DELTASONE Take 1 tablet (10 mg total) by mouth daily.   tamsulosin 0.4 MG Caps capsule Commonly known as: FLOMAX Take 1 capsule (0.4 mg total) by mouth daily. Start taking on: September 16, 2020   traZODone 100 MG tablet Commonly known as: DESYREL TAKE 1 TABLET BY MOUTH EVERYDAY AT BEDTIME What changed: See the new instructions.   Trelegy Ellipta 200-62.5-25 MCG/INH Aepb Generic drug: Fluticasone-Umeclidin-Vilant Inhale 1 puff into the lungs daily. What changed: when to take this      Time spent for this discharge encounter is 38 minutes  Signed: Elie Confer 09/15/2020, 4:10 PM

## 2020-09-15 NOTE — Plan of Care (Signed)
  Problem: Education: Goal: Knowledge of General Education information will improve Description: Including pain rating scale, medication(s)/side effects and non-pharmacologic comfort measures 09/15/2020 1558 by Sharene Butters, RN Outcome: Adequate for Discharge 09/15/2020 1558 by Sharene Butters, RN Outcome: Adequate for Discharge   Problem: Activity: Goal: Risk for activity intolerance will decrease 09/15/2020 1558 by Sharene Butters, RN Outcome: Adequate for Discharge 09/15/2020 1558 by Sharene Butters, RN Outcome: Adequate for Discharge   Problem: Pain Managment: Goal: General experience of comfort will improve 09/15/2020 1558 by Sharene Butters, RN Outcome: Adequate for Discharge 09/15/2020 1558 by Sharene Butters, RN Outcome: Adequate for Discharge   Problem: Safety: Goal: Ability to remain free from injury will improve 09/15/2020 1558 by Sharene Butters, RN Outcome: Adequate for Discharge 09/15/2020 1558 by Sharene Butters, RN Outcome: Adequate for Discharge   Problem: Education: Goal: Knowledge of disease or condition will improve 09/15/2020 1558 by Sharene Butters, RN Outcome: Adequate for Discharge 09/15/2020 1558 by Sharene Butters, RN Outcome: Adequate for Discharge Goal: Knowledge of the prescribed therapeutic regimen will improve 09/15/2020 1558 by Sharene Butters, RN Outcome: Adequate for Discharge 09/15/2020 1558 by Sharene Butters, RN Outcome: Adequate for Discharge Goal: Individualized Educational Video(s) 09/15/2020 1558 by Sharene Butters, RN Outcome: Adequate for Discharge 09/15/2020 1558 by Sharene Butters, RN Outcome: Adequate for Discharge   Problem: Activity: Goal: Ability to tolerate increased activity will improve 09/15/2020 1558 by Sharene Butters, RN Outcome: Adequate for Discharge 09/15/2020 1558 by Sharene Butters, RN Outcome: Adequate for Discharge Goal: Will verbalize the importance of balancing activity with adequate rest periods 09/15/2020 1558 by Sharene Butters, RN Outcome: Adequate for  Discharge 09/15/2020 1558 by Sharene Butters, RN Outcome: Adequate for Discharge   Problem: Respiratory: Goal: Ability to maintain a clear airway will improve 09/15/2020 1558 by Sharene Butters, RN Outcome: Adequate for Discharge 09/15/2020 1558 by Sharene Butters, RN Outcome: Adequate for Discharge Goal: Levels of oxygenation will improve 09/15/2020 1558 by Sharene Butters, RN Outcome: Adequate for Discharge 09/15/2020 1558 by Sharene Butters, RN Outcome: Adequate for Discharge Goal: Ability to maintain adequate ventilation will improve 09/15/2020 1558 by Sharene Butters, RN Outcome: Adequate for Discharge 09/15/2020 1558 by Sharene Butters, RN Outcome: Adequate for Discharge

## 2020-09-16 ENCOUNTER — Telehealth: Payer: Self-pay | Admitting: Family Medicine

## 2020-09-16 NOTE — Telephone Encounter (Signed)
Transition Care Management Follow-up Telephone Call  Date of discharge and from where: 09/15/2020 from [place]   How have you been since you were released from the hospital? Patient states she is doing much better since leaving hospital   Any questions or concerns? No  Items Reviewed:  Did the pt receive and understand the discharge instructions provided? Yes   Medications obtained and verified? Yes   Other? No   Any new allergies since your discharge? No   Dietary orders reviewed? Yes  Do you have support at home? Yes   Home Care and Equipment/Supplies: Were home health services ordered? yes If so, what is the name of the agency? [facility]  Has the agency set up a time to come to the patient's home? yes Were any new equipment or medical supplies ordered?  No What is the name of the medical supply agency? N/A  Were you able to get the supplies/equipment? not applicable Do you have any questions related to the use of the equipment or supplies? No  Functional Questionnaire: (I = Independent and D = Dependent) ADLs: I  Bathing/Dressing- I  Meal Prep- I  Eating- I  Maintaining continence- I  Transferring/Ambulation-  I uses a wheelchair when going out   Managing Meds- I  Follow up appointments reviewed:   PCP Hospital f/u appt confirmed? Yes  Scheduled to see Dr. [place]  on 09/26/2020 @ [facility] f/u appt confirmed? Yes  Scheduled to see Dr. Volanda Napoleon on 09/23/2020 @ 2:00 PM.  Are transportation arrangements needed? No   If their condition worsens, is the pt aware to call PCP or go to the Emergency Dept.? Yes  Was the patient provided with contact information for the PCP's office or ED? Yes  Was to pt encouraged to call back with questions or concerns? Yes

## 2020-09-19 ENCOUNTER — Other Ambulatory Visit: Payer: Self-pay | Admitting: Nurse Practitioner

## 2020-09-19 ENCOUNTER — Telehealth: Payer: Self-pay | Admitting: Family Medicine

## 2020-09-19 DIAGNOSIS — I10 Essential (primary) hypertension: Secondary | ICD-10-CM

## 2020-09-19 DIAGNOSIS — I2510 Atherosclerotic heart disease of native coronary artery without angina pectoris: Secondary | ICD-10-CM

## 2020-09-19 DIAGNOSIS — E7800 Pure hypercholesterolemia, unspecified: Secondary | ICD-10-CM

## 2020-09-19 NOTE — Telephone Encounter (Signed)
Okay for verbal 

## 2020-09-19 NOTE — Telephone Encounter (Signed)
He is okay to give verbal authorization to requested services. Thanks, BJ

## 2020-09-19 NOTE — Telephone Encounter (Signed)
[name] is calling requesting verbals orders for OT. Frequency is 2 week 1and 1 week 1, please advise. CB is [phone]

## 2020-09-20 ENCOUNTER — Telehealth: Payer: Self-pay | Admitting: Primary Care

## 2020-09-20 NOTE — Telephone Encounter (Signed)
ATC patient, advised she was with one of her physical therapist at the time of my call.  Advised we would call back later.  Message sent to pcc to see if they can get her scheduled for split night sleep study.

## 2020-09-20 NOTE — Telephone Encounter (Signed)
VO given.

## 2020-09-20 NOTE — Telephone Encounter (Signed)
Pt had a split night study done 07/28/20 does she need another one if so we need an order Joellen [place]

## 2020-09-20 NOTE — Telephone Encounter (Signed)
Routing to MR for clarification- pt had a split night study 07/28/20, I do not see any results of this sleep study documented.  Please advise if patient is needing another sleep study at this time. Thanks!

## 2020-09-21 ENCOUNTER — Other Ambulatory Visit: Payer: Self-pay | Admitting: Family Medicine

## 2020-09-23 ENCOUNTER — Ambulatory Visit: Payer: Medicare Other | Admitting: Primary Care

## 2020-09-23 NOTE — Telephone Encounter (Signed)
   She was discharged from hospital 12/22/1  with instructions, "OSA Patient recently diagnosed with OSA via sleep study (07/27/2020) however patient stated has not had a follow-up since sleep study date. Patient tolerating CPAP while in-house. Will need outpatient follow-up with PCP/pulmonary in order to set up patient with outpatient CPAP."     I do not do sleep medicine  Plan  - refer to one of the sleep docs in the practice  Thanks  MR

## 2020-09-23 NOTE — Telephone Encounter (Signed)
Called and spoke with pt letting her know the info stated by MR and she verbalized understanding. Pt stated that she was needing to have a repeat sleep study and was hoping to have this done before the end of the year and I told her that we were at least 2-4 weeks out before she might be able to have sleep study performed and she verbalized understanding. Pt had an appt scheduled today 12/10 at 2pm with Beth to discuss having repeat sleep study done but after I stated that to her that she will probably not be able to have it done by the end of the year, she stated that that appt could be cancelled and a sleep consult has been scheduled for pt with CY 12/22. Nothing further needed.

## 2020-09-26 ENCOUNTER — Other Ambulatory Visit: Payer: Self-pay

## 2020-09-26 ENCOUNTER — Ambulatory Visit (INDEPENDENT_AMBULATORY_CARE_PROVIDER_SITE_OTHER): Payer: Medicare Other | Admitting: Family Medicine

## 2020-09-26 ENCOUNTER — Encounter: Payer: Self-pay | Admitting: Family Medicine

## 2020-09-26 VITALS — BP 130/60 | HR 92 | Temp 98.7°F | Resp 16 | Ht 66.0 in | Wt 135.6 lb

## 2020-09-26 DIAGNOSIS — E876 Hypokalemia: Secondary | ICD-10-CM | POA: Insufficient documentation

## 2020-09-26 DIAGNOSIS — G4733 Obstructive sleep apnea (adult) (pediatric): Secondary | ICD-10-CM | POA: Diagnosis not present

## 2020-09-26 DIAGNOSIS — I10 Essential (primary) hypertension: Secondary | ICD-10-CM

## 2020-09-26 DIAGNOSIS — I5020 Unspecified systolic (congestive) heart failure: Secondary | ICD-10-CM

## 2020-09-26 DIAGNOSIS — J441 Chronic obstructive pulmonary disease with (acute) exacerbation: Secondary | ICD-10-CM

## 2020-09-26 DIAGNOSIS — G4700 Insomnia, unspecified: Secondary | ICD-10-CM

## 2020-09-26 DIAGNOSIS — R339 Retention of urine, unspecified: Secondary | ICD-10-CM

## 2020-09-26 DIAGNOSIS — F419 Anxiety disorder, unspecified: Secondary | ICD-10-CM

## 2020-09-26 MED ORDER — TAMSULOSIN HCL 0.4 MG PO CAPS
0.4000 mg | ORAL_CAPSULE | Freq: Every day | ORAL | 0 refills | Status: DC
Start: 2020-09-26 — End: 2021-01-02

## 2020-09-26 NOTE — Patient Instructions (Signed)
A few things to remember from today's visit:   OSA (obstructive sleep apnea)  HFrEF (heart failure with reduced ejection fraction) ([place]) - Plan: BASIC METABOLIC PANEL WITH GFR  COPD, frequent exacerbations (HCC)  Hypokalemia  Insomnia, unspecified type  If you need refills please call your pharmacy. Do not use My Chart to request refills or for acute issues that need immediate attention.   Flomax to continue. Adequate hydration and low salt diet to continue. Keep appointment with urologist in 10/2020. No changes in inhalers, keep appt with lung doctor.  Please be sure medication list is accurate. If a new problem present, please set up appointment sooner than planned today.

## 2020-09-26 NOTE — Progress Notes (Addendum)
HPI: [name] is a 74 y.o. female, who is here today to follow on recent hospitalization. She was re-admitted on 09/10/2020 and discharged on 09/15/20. COPD exacerbation: Reporting great improvement. She is on supplemental O2 at 2 LPM with [place]. Dulera 200-5 mcg twice daily was added.  She is still on DuoNeb and Trelegy Ellipta 200-62.5-25 mcg once daily. Discharged on Doxycycline 100 mg bid and prednisone 10 mg daily. She has not noted fever or chills. "Little" wheezing and SOB exacerbated by exertion and alleviated by rest. No associated cough or CP.  She has had some shaking and felling hot with exertion, attributed to neb meds. Sleeping on 2 pillows, which is her baseline. Negative for PND or edema.  Echo on 05/21/2020: LVEF 45 to 50%, global hypokinesis. 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  She appears to have a bovine arch these arteries are all patent.  After intervention of the left subclavian artery there is antegrade flow in the left vertebral artery 0% residual stenosis were previously was occluded. 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   Bilateral renal arteries without stenosis.  She has decreased fluid and salt intake.  She was recently Dx'ed with OSA, she is wearing her CPAP machine and feels rested next day. Insomnia on Trazodone 100 mg daily Anxiety: She is on Paroxetine 20 mg daily and Lorazepam 2 mg daily. Tolerating medication well.  She is driving today.  She still feels "weak", she is doing some PT exercises and does not need a cane or walker. Appetite has improved,she is trying to gain wt. HH has been arranged. PT x 2/week and OT x 2/week.  Urinary retention: Flomax 0.4 mg added. She has noted great improvement after taking medication.  Nocturia x 3. Tolerating medication well. She is checking BP at home,  130s/70s. Negative for dizziness upon getting up.  She follows with urologist, last visit 4 months ago. Next appt 10/2020.  Lab Results  Component Value Date   CREATININE 0.85 09/15/2020   BUN 22 09/15/2020   NA 136 09/15/2020   K 4.2 09/15/2020   CL 98 09/15/2020   CO2 29 09/15/2020   Anemia: Colonoscopy in 11/2014: Diverticulosis of sigmoid colon and polypectomy x1, 5-year follow-up recommended. She has not noted blood in the stool or melena.  Lab Results  Component Value Date   WBC 5.5 09/15/2020   HGB 11.7 (L) 09/15/2020   HCT 35.0 (L) 09/15/2020   MCV 90.9 09/15/2020   PLT 480 (H) 09/15/2020    Review of Systems  Constitutional: Positive for activity change, appetite change and fatigue.  HENT: Negative for mouth sores, nosebleeds and sore throat.   Eyes: Negative for redness and visual disturbance.  Respiratory: Positive for shortness of breath and wheezing.   Cardiovascular: Negative for palpitations and leg swelling.  Gastrointestinal: Negative for abdominal pain, nausea and vomiting.       Negative for changes in bowel habits.  Genitourinary: Negative for decreased urine volume and hematuria.  Musculoskeletal: Positive for gait problem. Negative for myalgias.  Neurological: Negative for syncope, facial asymmetry and weakness.  Psychiatric/Behavioral: Positive for sleep disturbance. Negative for confusion. The patient is nervous/anxious.   Rest see pertinent positives and negatives per HPI.  Current Outpatient Medications on File Prior to Visit  Medication Sig Dispense Refill  . acetaminophen (TYLENOL) 325 MG tablet Take 2 tablets (  650 mg total) by mouth every 6 (six) hours as needed for mild pain (or Fever >/= 101).    Marland Kitchen albuterol (VENTOLIN HFA) 108 (90 Base) MCG/ACT inhaler TAKE 2 PUFFS BY MOUTH EVERY 6 HOURS AS NEEDED FOR WHEEZE OR SHORTNESS OF BREATH (Patient taking differently: Inhale 2 puffs into the lungs every 6 (six) hours as needed for wheezing. ) 8.5 each  3  . aspirin EC 81 MG tablet Take 1 tablet (81 mg total) by mouth daily. 30 tablet   . atorvastatin (LIPITOR) 80 MG tablet TAKE 1 TABLET BY MOUTH EVERY DAY 90 tablet 2  . benzonatate (TESSALON PERLES) 100 MG capsule Take 1 capsule (100 mg total) by mouth 3 (three) times daily as needed for cough. 30 capsule 0  . Biotin 10000 MCG TABS Take 10,000 mcg by mouth daily.    . budesonide (PULMICORT) 0.5 MG/2ML nebulizer solution Take 2 mLs (0.5 mg total) by nebulization 2 (two) times daily. 20 mL 12  . cilostazol (PLETAL) 100 MG tablet Take 1 tablet (100 mg total) by mouth 2 (two) times daily. 60 tablet 11  . clopidogrel (PLAVIX) 75 MG tablet Take 1 tablet (75 mg total) by mouth daily. 30 tablet 11  . dextromethorphan-guaiFENesin (MUCINEX DM) 30-600 MG 12hr tablet Take 1 tablet by mouth 2 (two) times daily as needed for cough.     . doxycycline (VIBRA-TABS) 100 MG tablet Take 1 tablet (100 mg total) by mouth 2 (two) times daily for 14 days. 28 tablet 0  . fluticasone (FLONASE) 50 MCG/ACT nasal spray USE 1 SPRAY INTO EACH NOSTRIL TWICE A DAY (Patient taking differently: Place 2 sprays into both nostrils in the morning and at bedtime. ) 48 mL 1  . Fluticasone-Umeclidin-Vilant (TRELEGY ELLIPTA) 200-62.5-25 MCG/INH AEPB Inhale 1 puff into the lungs daily. (Patient taking differently: Inhale 1 puff into the lungs 2 (two) times daily. ) 60 each 5  . furosemide (LASIX) 40 MG tablet Take 1 tablet (40 mg total) by mouth 2 (two) times daily. 60 tablet 1  . HYDROcodone-homatropine (HYCODAN) 5-1.5 MG/5ML syrup Take 5 mLs by mouth every 6 (six) hours as needed for cough. 120 mL 0  . ipratropium-albuterol (DUONEB) 0.5-2.5 (3) MG/3ML SOLN Inhale 3 mLs into the lungs every 6 (six) hours as needed (sob, wheezing). 360 mL 6  . levothyroxine (SYNTHROID) 112 MCG tablet TAKE 1 TABLET (112 MCG TOTAL) BY MOUTH DAILY BEFORE BREAKFAST. 90 tablet 1  . LORazepam (ATIVAN) 2 MG tablet Take 1 tablet (2 mg total) by mouth at bedtime. 30  tablet 3  . montelukast (SINGULAIR) 10 MG tablet Take 1 tablet (10 mg total) by mouth at bedtime. 90 tablet 3  . pantoprazole (PROTONIX) 40 MG tablet TAKE 1 TABLET BY MOUTH EVERY DAY 30 tablet 1  . PARoxetine (PAXIL) 20 MG tablet TAKE 1 TABLET BY MOUTH EVERYDAY AT BEDTIME (Patient taking differently: Take 20 mg by mouth daily. TAKE 1 TABLET BY MOUTH EVERYDAY AT BEDTIME) 90 tablet 1  . potassium chloride SA (KLOR-CON M20) 20 MEQ tablet Take 1 tablet (20 mEq total) by mouth 3 (three) times daily. 90 tablet 0  . predniSONE (DELTASONE) 10 MG tablet Take 1 tablet (10 mg total) by mouth daily. 30 tablet 0  . traZODone (DESYREL) 100 MG tablet TAKE 1 TABLET BY MOUTH EVERYDAY AT BEDTIME (Patient taking differently: Take 100 mg by mouth at bedtime. ) 90 tablet 1   No current facility-administered medications on file prior to visit.     Past  Medical History:  Diagnosis Date  . Bilateral leg cramps   . Bladder neoplasm   . Cancer ([place])   . CHF (congestive heart failure) ([place])   . Chronic deep vein thrombosis (DVT) of right lower extremity ([place])    05/ 2018  chronic non-occlusive DVT RLE  . COPD, frequent exacerbations ([place])    03-06-2018 per pt last exacerbation 12/ 2018  . Coronary artery disease    cardiologist-  dr Aundra Dubin-- 03-11-2018 er cath , 80% stenosis in the ostial second diagonal  . Dyspnea on minimal exertion   . Emphysema/COPD ([place])    CAT score- 17  . Family history of breast cancer   . Family history of colon cancer   . Family history of melanoma   . Family history of ovarian cancer   . Family history of pancreatic cancer   . Fibromyalgia 1995  . GERD (gastroesophageal reflux disease)   . Hiatal hernia   . History of breast cancer   . History of diverticulitis of colon    2002  s/p  sigmoid colectomy  . History of multiple pulmonary nodules    hx RUL nodules x2  s/p right VATS w/ wedge resection 09-11-2005 and 06-14-2011  both  necrotizing granulomatous inflammation w/ cystic  area of necrosis & focal calcification  . History of right breast cancer oncologist-  dr Jana Hakim-- no recurrence   dx 2010--  IDC, Stage IA , ER/PR+,  (pT1c pN0) 11-09-2008 right lumpectomy;  12-18-2008 right simple mastectomy for DCIS margins;  completed chemotherapy 2010 (no radiation) and completed antiestrogen therapy  . Hx of colonic polyps    Dr. Cristina Gong -last study '11  . Hyperlipidemia   . Hypertension   . Hypothyroidism   . 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 Nocturia   . OA (osteoarthritis)    thumbs  . On supplemental oxygen therapy    03-06-2018 per pt uses only at night,  checks O2 sats at home,  stated am sat 89% after moving around average 93-94% with RA  . Osteoporosis   . Peripheral arterial occlusive disease [facility]) vascular-- dr chen/ dr Donzetta Matters   proximal right SFA severe focal stenosis with collaterals from the PFA/  04/ 2012 occluded celiac and SMA arteries with distal reconstitution w/ patent IMA  . Peripheral vascular disease ([place])    chronic DVT RLE,  mesenteric vascular disease  . Right lower lobe pulmonary nodule    Chest CT 08-29-2017  . Stage 3 severe COPD by GOLD classification ([place]) hx frequent exacerbations--   pulmologist-  dr Maryann Alar--  per lov note , dated 12-20-2017, oxyogen 2L is prescribed for use with exertion (but pt only uses mostly at night), O2 sats on RA run the 80s , this day sat 86% RA and with 2L O2 sat 92%  . Varicose veins of leg with swelling    varicose vein surgery - Dr. Aleda Grana  . Wears glasses   . Wears hearing aid in both ears    Allergies  Allergen Reactions  . Sulfa Antibiotics Swelling    Social History   Socioeconomic History  . Marital status: Married    Spouse name: Not on file  . Number of children: 3  . Years of education: 71  . Highest education level: Not on file  Occupational History  . Occupation: Chiropractor: RETIRED    Comment: retired  Tobacco Use  .  Smoking status: Former Smoker  Packs/day: 0.75    Years: 50.00    Pack years: 37.50    Types: Cigarettes    Quit date: 05/18/2016    Years since quitting: 4.3  . Smokeless tobacco: Never Used  Vaping Use  . Vaping Use: Never used  Substance and Sexual Activity  . Alcohol use: Yes    Alcohol/week: 10.0 standard drinks    Types: 10 Cans of beer per week  . Drug use: No  . Sexual activity: Not on file  Other Topics Concern  . Not on file  Social History Narrative   HSG, beauty school. Married '69 -16 yrs/widowed; married '79 - 23yr/divorced; married '87.  2 sons - '70, '74, 1 srtep-son; 1 grandchild. 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 SO - good health.  NO history of abuse.  ACP - full code and all heroic measures.    Social Determinants of Health   Financial Resource Strain: Not on file  Food Insecurity: Not on file  Transportation Needs: Not on file  Physical Activity: Not on file  Stress: Not on file  Social Connections: Not on file    Vitals:   09/26/20 1504  BP: 130/60  Pulse: 92  Resp: 16  Temp: 98.7 F (37.1 C)  SpO2: 91%   Body mass index is 21.89 kg/m.   Physical Exam Vitals and nursing note reviewed.  Constitutional:      General: She is not in acute distress.    Appearance: She is well-developed.  HENT:     Head: Normocephalic and atraumatic.     Mouth/Throat:     Mouth: Oropharynx is clear and moist and mucous membranes are normal. Mucous membranes are moist.     Pharynx: Oropharynx is clear.  Eyes:     Conjunctiva/sclera: Conjunctivae normal.     Pupils: Pupils are equal, round, and reactive to light.  Neck:     Vascular: No JVD.  Cardiovascular:     Rate and Rhythm: Normal rate and regular rhythm.     Pulses:          Dorsalis pedis pulses are 2+ on the right side and 2+ on the left side.     Heart sounds: No murmur heard.   Pulmonary:     Effort: Pulmonary effort is normal. No respiratory distress.     Breath sounds: Normal breath sounds.   Abdominal:     Palpations: Abdomen is soft. There is no hepatomegaly or mass.     Tenderness: There is no abdominal tenderness.  Musculoskeletal:        General: No edema.     Right lower leg: No edema.     Left lower leg: No edema.  Lymphadenopathy:     Cervical: No cervical adenopathy.  Skin:    General: Skin is warm.     Findings: No erythema or rash.  Neurological:     Mental Status: She is alert and oriented to person, place, and time.     Cranial Nerves: No cranial nerve deficit.     Gait: Gait normal.     Deep Tendon Reflexes: Strength normal.     Comments: Mildly unstable with no assistance.  Psychiatric:        Mood and Affect: Mood and affect normal.     Comments: Well groomed, good eye contact.   ASSESSMENT AND PLAN:  [name] was seen today for hospitalization follow-up.  Diagnoses and all orders for this visit:  Orders Placed This Encounter  Procedures  . BASIC  METABOLIC PANEL WITH GFR   Lab Results  Component Value Date   CREATININE 0.95 (H) 09/26/2020   BUN 19 09/26/2020   NA 136 09/26/2020   K 4.2 09/26/2020   CL 100 09/26/2020   CO2 25 09/26/2020   Urinary retention Greatly improved with Flomax 0.4 mg. Some side effects discussed. She has appt with urologist in 10/2020.  -     tamsulosin (FLOMAX) 0.4 MG CAPS capsule; Take 1 capsule (0.4 mg total) by mouth daily.  Essential hypertension BP has been adequately controlled. Continue monitoring BP at home. Low salt diet.  Anxiety disorder Stable. Continue Paroxetine 20 mg daily and Lorazepam 2 mg daily. We discussed risk of medication interaction with some of her other meds.  OSA (obstructive sleep apnea) Continue CPAP. Follow with Dr Annamaria Boots.  HFrEF (heart failure with reduced ejection fraction) (HCC) Asymptomatic. Continue Furosemide 40 mg bid. Low salt diet. Has appt with cardiologist on 09/27/20.  COPD, frequent exacerbations ([place]) Greatly improved. For now no changes in current  management (Dulera, DuoNeb, Trelegy Ellipta, and Pulmicort neb).  She will meet with her pulmonologist in a week, so treatment can be reviewed.   Hypokalemia Continue K+ supplementation. Some side effects of diuretics discussed. Further recommendations according to BMP.  Insomnia Problem seems to be well controlled. Continue trazodone 100 mg daily. Good sleep hygiene is also recommended.   Spent 55 minutes with pt.  During this time history was obtained and documented, examination was performed, prior labs/imaging reviewed, and assessment/plan discussed.  Return in about 6 weeks (around 11/07/2020).   Ledarius Leeson G. [place], MD  [facility]. [place] office.

## 2020-09-26 NOTE — Assessment & Plan Note (Signed)
Asymptomatic. Continue Furosemide 40 mg bid. Low salt diet. Has appt with cardiologist on 09/27/20.

## 2020-09-26 NOTE — Assessment & Plan Note (Addendum)
Greatly improved. For now no changes in current management (Dulera, DuoNeb, Trelegy Ellipta, and Pulmicort neb).  She will meet with her pulmonologist in a week, so treatment can be reviewed.

## 2020-09-26 NOTE — Assessment & Plan Note (Signed)
Stable. Continue Paroxetine 20 mg daily and Lorazepam 2 mg daily. We discussed risk of medication interaction with some of her other meds.

## 2020-09-26 NOTE — Assessment & Plan Note (Signed)
BP has been adequately controlled. Continue monitoring BP at home. Low salt diet.

## 2020-09-26 NOTE — Assessment & Plan Note (Signed)
Problem seems to be well controlled. Continue trazodone 100 mg daily. Good sleep hygiene is also recommended.

## 2020-09-26 NOTE — Assessment & Plan Note (Signed)
Continue CPAP. Follow with Dr Annamaria Boots.

## 2020-09-26 NOTE — Assessment & Plan Note (Addendum)
Continue K+ supplementation. Some side effects of diuretics discussed. Further recommendations according to BMP.

## 2020-09-27 ENCOUNTER — Encounter: Payer: Self-pay | Admitting: Nurse Practitioner

## 2020-09-27 ENCOUNTER — Ambulatory Visit (INDEPENDENT_AMBULATORY_CARE_PROVIDER_SITE_OTHER): Payer: Medicare Other | Admitting: Nurse Practitioner

## 2020-09-27 VITALS — BP 120/80 | HR 82 | Ht 66.0 in | Wt 133.6 lb

## 2020-09-27 DIAGNOSIS — I259 Chronic ischemic heart disease, unspecified: Secondary | ICD-10-CM

## 2020-09-27 DIAGNOSIS — I999 Unspecified disorder of circulatory system: Secondary | ICD-10-CM

## 2020-09-27 DIAGNOSIS — R0609 Other forms of dyspnea: Secondary | ICD-10-CM

## 2020-09-27 DIAGNOSIS — R0600 Dyspnea, unspecified: Secondary | ICD-10-CM

## 2020-09-27 DIAGNOSIS — I739 Peripheral vascular disease, unspecified: Secondary | ICD-10-CM | POA: Diagnosis not present

## 2020-09-27 LAB — BASIC METABOLIC PANEL WITH GFR
BUN/Creatinine Ratio: 20 (calc) (ref 6–22)
BUN: 19 mg/dL (ref 7–25)
CO2: 25 mmol/L (ref 20–32)
Calcium: 9.6 mg/dL (ref 8.6–10.4)
Chloride: 100 mmol/L (ref 98–110)
Creat: 0.95 mg/dL — ABNORMAL HIGH (ref 0.60–0.93)
GFR, Est African American: 68 mL/min/{1.73_m2} (ref >=60)
GFR, Est Non African American: 59 mL/min/{1.73_m2} — ABNORMAL LOW (ref >=60)
Glucose, Bld: 95 mg/dL (ref 65–99)
Potassium: 4.2 mmol/L (ref 3.5–5.3)
Sodium: 136 mmol/L (ref 135–146)

## 2020-09-27 NOTE — Patient Instructions (Addendum)
After Visit Summary:  We will be checking the following labs today - NONE   Medication Instructions:    Continue with your current medicines.    If you need a refill on your cardiac medications before your next appointment, please call your pharmacy.     Testing/Procedures To Be Arranged:  N/A  Follow-Up:   See Dr. Gwyndolyn Kaufman as a new patient in 3 to 4 months  Call Dr. Donzetta Matters and get your follow up rescheduled with him.     At [facility], you and your health needs are our priority.  As part of our continuing mission to provide you with exceptional heart care, we have created designated Provider Care Teams.  These Care Teams include your primary Cardiologist (physician) and Advanced Practice Providers (APPs -  Physician Assistants and Nurse Practitioners) who all work together to provide you with the care you need, when you need it.  Special Instructions:  . Stay safe, wash your hands for at least 20 seconds and wear a mask when needed.  . It was good to talk with you today.    Call the [place] office at [phone] if you have any questions, problems or concerns.

## 2020-09-28 ENCOUNTER — Ambulatory Visit: Payer: Medicare Other | Admitting: Nurse Practitioner

## 2020-10-04 NOTE — Progress Notes (Signed)
10/05/20- 38 yoF former smoker for sleep evaluation courtesy of Dr Betty Martinique for OSA., Insomnia,  Medical problem list includes  sCHF, CAD, Chronic DVT R lower leg, Mesenteric Ischemia, HTN, PAD, COPD( GOLD III)/ Dr Chase Caller,  Chronic Respiratory Failure with Hypoxia, GERD, Hemoptysis,  Hypothyroid, Hx R Breast Cancer,  NPSG  07/27/20- AHI 26.5/ hr, desaturation to 84%, requiring 1L O2 (Nocturnal Hypoxemia), Body Weight 135 lbs                              Patient is hard of hearing. Husband is here.  O2 2L [facility] 2L sleep  Body weight today- 134 lbs Epworth score- 3 with CPAP Covid vax- 1 Phizer Flu vax- none Hosp 11/27-12/2 for COPD exacerbation  She learned DME would need long time to get her a CPAP machine so she has borrowed one from her nephew- thinks set on 10. Using full-face mask from sleep study. Not satisfied with mask, but really happy with CPAP- sleeping much better.  Has had O2 for sleep from [facility] x 5 years for chronic hypoxic respiratory failure x 5 years and is bleeding that into CPAP mask.  Daytime room air saturation each morning at home is around 94%.  NEW PROBLEM- Hemoptysis. New for her- streak heme in kleenex yesterday and again today. Breathing feels much improved since last hosp. CT and CXR from that admit did not show obvious reason for new bleed on my review. Plavix was added then and she continues BASA.  GERD- noting more reflux/ heartburn since acid blocker changed to Protonix.   Prior to Admission medications   Medication Sig Start Date End Date Taking? Authorizing Provider  acetaminophen (TYLENOL) 325 MG tablet Take 2 tablets (650 mg total) by mouth every 6 (six) hours as needed for mild pain (or Fever >/= 101). 08/21/20  Yes Cherene Altes, MD  albuterol (VENTOLIN HFA) 108 (90 Base) MCG/ACT inhaler TAKE 2 PUFFS BY MOUTH EVERY 6 HOURS AS NEEDED FOR WHEEZE OR SHORTNESS OF BREATH 09/05/20  Yes Martyn Ehrich, NP  amLODipine (NORVASC) 5 MG tablet 1 tablet    Yes [provider]  aspirin EC 81 MG tablet Take 1 tablet (81 mg total) by mouth daily. 08/21/20  Yes Cherene Altes, MD  atorvastatin (LIPITOR) 80 MG tablet TAKE 1 TABLET BY MOUTH EVERY DAY 09/19/20  Yes [name], NP  benzonatate (TESSALON PERLES) 100 MG capsule Take 1 capsule (100 mg total) by mouth 3 (three) times daily as needed for cough. 05/29/20 05/29/21 Yes Shelly Coss, MD  Biotin 10000 MCG TABS Take 10,000 mcg by mouth daily.   Yes [provider]  bisacodyl (WOMENS LAXATIVE) 5 MG EC tablet 1 tablet   Yes [provider]  budesonide (PULMICORT) 0.5 MG/2ML nebulizer solution Take 2 mLs (0.5 mg total) by nebulization 2 (two) times daily. 09/15/20  Yes Elie Confer, MD  cilostazol (PLETAL) 100 MG tablet Take 1 tablet (100 mg total) by mouth 2 (two) times daily. 11/23/19  Yes [name], NP  clopidogrel (PLAVIX) 75 MG tablet Take 1 tablet (75 mg total) by mouth daily. 07/11/20 07/11/21 Yes Waynetta Sandy, MD  dextromethorphan-guaiFENesin [facility] DM) 30-600 MG 12hr tablet Take 1 tablet by mouth 2 (two) times daily as needed for cough.    Yes [provider]  diclofenac (VOLTAREN) 75 MG EC tablet 1 tablet   Yes [provider]  fluticasone (FLONASE) 50  MCG/ACT nasal spray USE 1 SPRAY INTO EACH NOSTRIL TWICE A DAY 06/01/20  Yes Martinique, [name], MD  Fluticasone-Umeclidin-Vilant (TRELEGY ELLIPTA) 200-62.5-25 MCG/INH AEPB Inhale 1 puff into the lungs daily. 04/25/20  Yes Martyn Ehrich, NP  furosemide (LASIX) 40 MG tablet Take 1 tablet (40 mg total) by mouth 2 (two) times daily. 08/21/20  Yes Cherene Altes, MD  HYDROcodone-homatropine [facility]) 5-1.5 MG/5ML syrup Take 5 mLs by mouth every 6 (six) hours as needed for cough. 09/15/20  Yes Elie Confer, MD  ipratropium-albuterol (DUONEB) 0.5-2.5 (3) MG/3ML SOLN Inhale 3 mLs into the lungs every 6 (six) hours as needed (sob, wheezing). 04/25/20  Yes Martyn Ehrich, NP   levothyroxine (SYNTHROID) 112 MCG tablet TAKE 1 TABLET (112 MCG TOTAL) BY MOUTH DAILY BEFORE BREAKFAST. 06/21/20  Yes Martinique, [name], MD  linaclotide [facility]) 145 MCG CAPS capsule 1 capsule at least 30 minutes before the first meal of the day on an empty stomach 12/05/18  Yes [provider]  LORazepam (ATIVAN) 2 MG tablet Take 1 tablet (2 mg total) by mouth at bedtime. 08/27/20  Yes Martinique, [name], MD  metoprolol tartrate (LOPRESSOR) 25 MG tablet 1 tablet   Yes [provider]  montelukast (SINGULAIR) 10 MG tablet Take 1 tablet (10 mg total) by mouth at bedtime. 04/29/20  Yes Martyn Ehrich, NP  pantoprazole (PROTONIX) 40 MG tablet TAKE 1 TABLET BY MOUTH EVERY DAY 09/22/20  Yes Martinique, [name], MD  PARoxetine (PAXIL) 20 MG tablet TAKE 1 TABLET BY MOUTH EVERYDAY AT BEDTIME 08/29/20  Yes Martinique, [name], MD  potassium chloride SA (KLOR-CON M20) 20 MEQ tablet Take 1 tablet (20 mEq total) by mouth 3 (three) times daily. 09/22/20  Yes Martinique, [name], MD  predniSONE (DELTASONE) 10 MG tablet Take 1 tablet (10 mg total) by mouth daily. 09/15/20 10/15/20 Yes Elie Confer, MD  tamsulosin (FLOMAX) 0.4 MG CAPS capsule Take 1 capsule (0.4 mg total) by mouth daily. 09/26/20  Yes Martinique, [name], MD  traZODone (DESYREL) 100 MG tablet TAKE 1 TABLET BY MOUTH EVERYDAY AT BEDTIME 06/27/20  Yes Martinique, [name], MD   Past Medical History:  Diagnosis Date  . Bilateral leg cramps   . Bladder neoplasm   . Cancer ([place])   . CHF (congestive heart failure) ([place])   . Chronic deep vein thrombosis (DVT) of right lower extremity ([place])    05/ 2018  chronic non-occlusive DVT RLE  . COPD, frequent exacerbations ([place])    03-06-2018 per pt last exacerbation 12/ 2018  . Coronary artery disease    cardiologist-  dr Aundra Dubin-- 03-11-2018 er cath , 80% stenosis in the ostial second diagonal  . Dyspnea on minimal exertion   . Emphysema/COPD ([place])    CAT score- 17  . Family history of breast cancer   . Family  history of colon cancer   . Family history of melanoma   . Family history of ovarian cancer   . Family history of pancreatic cancer   . Fibromyalgia 1995  . GERD (gastroesophageal reflux disease)   . Hiatal hernia   . History of breast cancer   . History of diverticulitis of colon    2002  s/p  sigmoid colectomy  . History of multiple pulmonary nodules    hx RUL nodules x2  s/p right VATS w/ wedge resection 09-11-2005 and 06-14-2011  both  necrotizing granulomatous inflammation w/ cystic area of necrosis & focal calcification  . History  of right breast cancer oncologist-  dr Jana Hakim-- no recurrence   dx 2010--  IDC, Stage IA , ER/PR+,  (pT1c pN0) 11-09-2008 right lumpectomy;  12-18-2008 right simple mastectomy for DCIS margins;  completed chemotherapy 2010 (no radiation) and completed antiestrogen therapy  . Hx of colonic polyps    Dr. Cristina Gong -last study '11  . Hyperlipidemia   . Hypertension   . Hypothyroidism   . 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 Nocturia   . OA (osteoarthritis)    thumbs  . On supplemental oxygen therapy    03-06-2018 per pt uses only at night,  checks O2 sats at home,  stated am sat 89% after moving around average 93-94% with RA  . Osteoporosis   . Peripheral arterial occlusive disease [facility]) vascular-- dr chen/ dr Donzetta Matters   proximal right SFA severe focal stenosis with collaterals from the PFA/  04/ 2012 occluded celiac and SMA arteries with distal reconstitution w/ patent IMA  . Peripheral vascular disease ([place])    chronic DVT RLE,  mesenteric vascular disease  . Right lower lobe pulmonary nodule    Chest CT 08-29-2017  . Stage 3 severe COPD by GOLD classification ([place]) hx frequent exacerbations--   pulmologist-  dr Maryann Alar--  per lov note , dated 12-20-2017, oxyogen 2L is prescribed for use with exertion (but pt only uses mostly at night), O2 sats on RA run the 80s , this day sat 86% RA and with 2L O2 sat 92%  .  Varicose veins of leg with swelling    varicose vein surgery - Dr. Aleda Grana  . Wears glasses   . Wears hearing aid in both ears    Past Surgical History:  Procedure Laterality Date  . ABDOMINAL AORTOGRAM N/A 07/11/2020   Procedure: ABDOMINAL AORTOGRAM;  Surgeon: Waynetta Sandy, MD;  Location: [place] [facility];  Service: Cardiovascular;  Laterality: N/A;  . AORTIC ARCH ANGIOGRAPHY N/A 07/11/2020   Procedure: AORTIC ARCH ANGIOGRAPHY;  Surgeon: Waynetta Sandy, MD;  Location: [place] [facility];  Service: Cardiovascular;  Laterality: N/A;  . BREAST EXCISIONAL BIOPSY Left 10/15/2008  . BREAST LUMPECTOMY W/ NEEDLE LOCALIZATION Right 11-09-2008    dr Brantley Stage   [facility]  . CARDIAC CATHETERIZATION  03-12-2011   dr Aundra Dubin   70-80% ostial stenosis in small second diagonal (appears to small for intervention),  dLAD 40-50%,  minimal luminal irregulartieis involoving LCFx and RCA,  LVEF 55%  . CATARACT EXTRACTION W/ INTRAOCULAR LENS  IMPLANT, BILATERAL  2011  . CYSTOSCOPY N/A 03/11/2018   Procedure: CYSTOSCOPY WITH INSTILLATION OF POST OPERATIVE EPIRUBICIN;  Surgeon: Festus Aloe, MD;  Location: [facility];  Service: Urology;  Laterality: N/A;  . LEFT HEART CATH AND CORONARY ANGIOGRAPHY N/A 05/25/2020   Procedure: LEFT HEART CATH AND CORONARY ANGIOGRAPHY;  Surgeon: Troy Sine, MD;  Location: [place] [facility];  Service: Cardiovascular;  Laterality: N/A;  . MASTECTOMY Right   . PARTIAL COLECTOMY  2002   sigmoid and appectomy (diverticulitis)  . PARTIAL MASTECTOMY WITH NEEDLE LOCALIZATION Left 02/23/2013   Procedure:  LEFT PARTIAL MASTECTOMY WITH NEEDLE LOCALIZATION;  Surgeon: Adin Hector, MD;  Location: [place];  Service: General;  Laterality: Left;  . PERIPHERAL VASCULAR INTERVENTION  07/11/2020   Procedure: PERIPHERAL VASCULAR INTERVENTION;  Surgeon: Waynetta Sandy, MD;  Location: [place] [facility];  Service: Cardiovascular;;  . RECONSTRUCTION  BREAST W/ LATISSIMUS DORSI FLAP Right 05-30-2009   dr  bowers  [place]  . REDUCTION MAMMAPLASTY Left   . SIMPLE MASTECTOMY Right 12-28-2008    dr Brantley Stage  [facility]  . TRANSTHORACIC ECHOCARDIOGRAM  07-09-2016  dr Aundra Dubin   ef 55-60%, grade 1 diastoic dysfunction/  mild TR  . TRANSURETHRAL RESECTION OF BLADDER TUMOR N/A 03/11/2018   Procedure: TRANSURETHRAL RESECTION OF BLADDER TUMOR (TURBT) 2-5cm;  Surgeon: Festus Aloe, MD;  Location[facility];  Service: Urology;  Laterality: N/A;  . UPPER EXTREMITY ANGIOGRAPHY Left 07/11/2020   Procedure: Upper Extremity Angiography;  Surgeon: Waynetta Sandy, MD;  Location: [place] [facility];  Service: Cardiovascular;  Laterality: Left;  Marland Kitchen VAGINAL HYSTERECTOMY  1973  . VIDEO ASSISTED THORACOSCOPY (VATS)/WEDGE RESECTION Right 09-11-2005  &  06-14-2011   dr Fara Boros  [facility]   both RUL   Family History  Problem Relation Age of Onset  . Colon cancer Mother        colon  . COPD Mother        brown lung  . Hypertension Mother   . Diabetes Mother   . Emphysema Mother   . Coronary artery disease Father   . Heart attack Father   . Sudden death Father   . Heart disease Father   . Cancer Sister        throat  . Hypertension Sister   . Coronary artery disease Brother   . Heart attack Brother        early 32s  . Throat cancer Sister   . Ovarian cancer Sister        fallopian tube cancer in her 23s  . Melanoma Sister   . Hypertension Sister   . Pancreatic cancer Sister   . Melanoma Niece        dx in her 68s  . Breast cancer Niece 47   Social History   Socioeconomic History  . Marital status: Married    Spouse name: Not on file  . Number of children: 3  . Years of education: 69  . Highest education level: Not on file  Occupational History  . Occupation: Chiropractor: RETIRED    Comment: retired  Tobacco Use  . Smoking status: Former Smoker    Packs/day: 0.75    Years: 50.00    Pack years: 37.50    Types: Cigarettes    Quit  date: 05/18/2016    Years since quitting: 4.3  . Smokeless tobacco: Never Used  Vaping Use  . Vaping Use: Never used  Substance and Sexual Activity  . Alcohol use: Yes    Alcohol/week: 10.0 standard drinks    Types: 10 Cans of beer per week  . Drug use: No  . Sexual activity: Not on file  Other Topics Concern  . Not on file  Social History Narrative   HSG, beauty school. Married '69 -73 yrs/widowed; married '79 - 32yr/divorced; married '87.  2 sons - '70, '74, 1 srtep-son; 1 grandchild. 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 SO - good health.  NO history of abuse.  ACP - full code and all heroic measures.    Social Determinants of Health   Financial Resource Strain: Not on file  Food Insecurity: Not on file  Transportation Needs: Not on file  Physical Activity: Not on file  Stress: Not on file  Social Connections: Not on file  Intimate Partner Violence: Not on file   ROS-see HPI   + = positive Constitutional:    weight loss, night sweats, fevers, chills, fatigue,  lassitude. HEENT:    headaches, difficulty swallowing, tooth/dental problems, sore throat,       Sneezing,+ itching, ear ache, nasal congestion, post nasal drip, snoring CV:    chest pain, orthopnea, PND, swelling in lower extremities, anasarca,                                  dizziness, palpitations Resp:   +shortness of breath with exertion or at rest.                +productive cough,   non-productive cough, +coughing up of blood.              change in color of mucus.  wheezing.   Skin:    +rash or lesions. GI:  + heartburn, +indigestion, abdominal pain, nausea, vomiting, diarrhea,                 change in bowel habits, loss of appetite GU: dysuria, change in color of urine, no urgency or frequency.   flank pain. MS:   joint pain, stiffness, decreased range of motion, back pain. Neuro-     nothing unusual Psych:  change in mood or affect.  +depression or anxiety.   memory loss.  OBJ- Physical Exam General- Alert,  Oriented, Affect-appropriate, Distress- none acute, + frail, + wheelchair Skin- rash-none, lesions- none, excoriation- none Lymphadenopathy- none Head- atraumatic            Eyes- Gross vision intact, PERRLA, conjunctivae and secretions clear            Ears- Hearing, canals-normal            Nose- Clear, no-Septal dev, mucus, polyps, erosion, perforation             Throat- Mallampati II , mucosa clear , drainage- none, tonsils- atrophic Neck- flexible , trachea midline, no stridor , thyroid nl, carotid no bruit Chest - symmetrical excursion , unlabored           Heart/CV- RRR , no murmur , no gallop  , no rub, nl s1 s2                           - JVD- none , edema- none, stasis changes- none, varices- none           Lung- + decreased, no rhonchi, wheeze- none, cough+ dry, dullness-none, rub- none           Chest wall-  Abd-  Br/ Gen/ Rectal- Not done, not indicated Extrem- cyanosis- none, clubbing, none, atrophy- none, strength- nl Neuro- grossly intact to observation

## 2020-10-05 ENCOUNTER — Ambulatory Visit (INDEPENDENT_AMBULATORY_CARE_PROVIDER_SITE_OTHER): Payer: Medicare Other | Admitting: Internal Medicine

## 2020-10-05 ENCOUNTER — Encounter: Payer: Self-pay | Admitting: Internal Medicine

## 2020-10-05 ENCOUNTER — Other Ambulatory Visit: Payer: Self-pay

## 2020-10-05 VITALS — BP 120/70 | HR 77 | Temp 98.9°F | Ht 66.0 in | Wt 134.4 lb

## 2020-10-05 DIAGNOSIS — G4733 Obstructive sleep apnea (adult) (pediatric): Secondary | ICD-10-CM | POA: Diagnosis not present

## 2020-10-05 DIAGNOSIS — J441 Chronic obstructive pulmonary disease with (acute) exacerbation: Secondary | ICD-10-CM

## 2020-10-05 DIAGNOSIS — K219 Gastro-esophageal reflux disease without esophagitis: Secondary | ICD-10-CM

## 2020-10-05 DIAGNOSIS — J9621 Acute and chronic respiratory failure with hypoxia: Secondary | ICD-10-CM

## 2020-10-05 DIAGNOSIS — J9611 Chronic respiratory failure with hypoxia: Secondary | ICD-10-CM

## 2020-10-05 DIAGNOSIS — R042 Hemoptysis: Secondary | ICD-10-CM | POA: Insufficient documentation

## 2020-10-05 NOTE — Assessment & Plan Note (Signed)
Recent exacerbation required hosp.  Stable as of today's visit. Plan- need to f/u as [facility] Dr Chase Caller

## 2020-10-05 NOTE — Assessment & Plan Note (Signed)
New onset streak heme in last 2 days, after recent addition of Plavix by her report. Already on aspirin. Recent CT and CXR do not show obvious tumor. Hopefully this is benign hemorrhagic bronchitis. Plan- Stop aspirin for 3 days then restart. Report further bleeding. Might need bronchoscopy.

## 2020-10-05 NOTE — Assessment & Plan Note (Signed)
Moderately severe OSA now superimposed on pre-existing nocturna hypoxemia due to separate dx of chronic respiratory failure from COPD.  She is usin a borrowed machine, but apparently can keep it. She benefits from use at reported setting of 10. We need to find out what kind of machine and need to establish for support with [employer]. Plan- Continue CPAP 10 for now. If it is auto-capable machine we may consider pressure change. We need to establish download and get [employer] to provide supplies. Schedule mask fitting.

## 2020-10-05 NOTE — Patient Instructions (Addendum)
Order- DME [facility]- please assume support for CPAP. Has own machine set on 10 cwp. Please provide mask of choice, humidifier, supplies, as needed. Dx OSA.    Bleed in O2 2L through CPAP for dx Chronic respiratory failure with hypoxia.   Order- schedule mask fitting at sleep center  For the bleeding ( fancy word is hemoptysis)-    Skip your aspirin for 2 days, then restart as before. If you keep coughing up blood please let us know.  For the heartburn/ reflux-   Talk with your primary doctor about taking Protonix twice daily. It works best if taken before meals- like before breakfast and supper.    Try raising the head end of your bed frame with a brick under each of the head legs.   Keep appointment with Dr Chase Caller for general pulmonary issues as before.

## 2020-10-05 NOTE — Assessment & Plan Note (Signed)
Reports increased reflux and heart burn. Doesn't feel Protonix controls well. Says told not to use Pepcid because of interaction with cardiac med.  Plan- May need bid Rx. - needs to discuss with PCP. Instructed her and husband on elevation head of bed.

## 2020-10-05 NOTE — Assessment & Plan Note (Signed)
Has been using O2 2L for sleep from [employer] for dx resp failure due to COPD.  Plan- continue O2 2L, bleeding into CPAP at 2L

## 2020-10-06 ENCOUNTER — Telehealth: Payer: Self-pay | Admitting: Internal Medicine

## 2020-10-06 ENCOUNTER — Telehealth: Payer: Self-pay | Admitting: Family Medicine

## 2020-10-06 NOTE — Telephone Encounter (Signed)
Pt states when she gets well enough, she will get the covid vaccine.  Right now she is not, but plans to get in the near future.

## 2020-10-06 NOTE — Addendum Note (Signed)
Addended by: Lia Foyer R on: 10/06/2020 10:33 AM   Modules accepted: Orders

## 2020-10-06 NOTE — Telephone Encounter (Signed)
LMTCB

## 2020-10-10 ENCOUNTER — Other Ambulatory Visit: Payer: Self-pay | Admitting: Family Medicine

## 2020-10-10 NOTE — Telephone Encounter (Signed)
Called and spoke to pt. Pt states Dr. Annamaria Boots wanted to know what kind of CPAP pt used. Pt states she uses a ResMed. Nothing further needed at this time.   Will forward to Dr. Annamaria Boots as Juluis Rainier.

## 2020-10-16 ENCOUNTER — Other Ambulatory Visit: Payer: Self-pay | Admitting: Family Medicine

## 2020-10-21 ENCOUNTER — Other Ambulatory Visit: Payer: Self-pay

## 2020-10-21 MED ORDER — POTASSIUM CHLORIDE CRYS ER 20 MEQ PO TBCR
20.0000 meq | EXTENDED_RELEASE_TABLET | Freq: Three times a day (TID) | ORAL | 0 refills | Status: DC
Start: 2020-10-21 — End: 2020-11-18

## 2020-10-26 ENCOUNTER — Other Ambulatory Visit: Payer: Self-pay

## 2020-10-26 ENCOUNTER — Ambulatory Visit (HOSPITAL_BASED_OUTPATIENT_CLINIC_OR_DEPARTMENT_OTHER): Payer: Medicare Other | Attending: Internal Medicine | Admitting: Internal Medicine

## 2020-10-26 ENCOUNTER — Ambulatory Visit: Payer: Medicare Other | Admitting: Family Medicine

## 2020-10-26 DIAGNOSIS — G4733 Obstructive sleep apnea (adult) (pediatric): Secondary | ICD-10-CM

## 2020-11-01 ENCOUNTER — Other Ambulatory Visit: Payer: Self-pay | Admitting: Family Medicine

## 2020-11-10 ENCOUNTER — Encounter: Payer: Self-pay | Admitting: Family Medicine

## 2020-11-18 ENCOUNTER — Other Ambulatory Visit: Payer: Self-pay

## 2020-11-18 ENCOUNTER — Inpatient Hospital Stay: Payer: Medicare Other | Attending: Adult Health

## 2020-11-18 ENCOUNTER — Other Ambulatory Visit: Payer: Self-pay | Admitting: Family Medicine

## 2020-11-18 DIAGNOSIS — Z9011 Acquired absence of right breast and nipple: Secondary | ICD-10-CM | POA: Insufficient documentation

## 2020-11-18 DIAGNOSIS — Z853 Personal history of malignant neoplasm of breast: Secondary | ICD-10-CM | POA: Diagnosis present

## 2020-11-18 DIAGNOSIS — Z7982 Long term (current) use of aspirin: Secondary | ICD-10-CM | POA: Diagnosis not present

## 2020-11-18 DIAGNOSIS — D75839 Thrombocytosis, unspecified: Secondary | ICD-10-CM

## 2020-11-18 DIAGNOSIS — Z79899 Other long term (current) drug therapy: Secondary | ICD-10-CM | POA: Diagnosis not present

## 2020-11-18 DIAGNOSIS — Z170 Estrogen receptor positive status [ER+]: Secondary | ICD-10-CM

## 2020-11-18 DIAGNOSIS — Z8551 Personal history of malignant neoplasm of bladder: Secondary | ICD-10-CM | POA: Diagnosis not present

## 2020-11-18 DIAGNOSIS — C50911 Malignant neoplasm of unspecified site of right female breast: Secondary | ICD-10-CM

## 2020-11-18 DIAGNOSIS — C50811 Malignant neoplasm of overlapping sites of right female breast: Secondary | ICD-10-CM

## 2020-11-18 LAB — CBC WITH DIFFERENTIAL (CANCER CENTER ONLY)
Abs Immature Granulocytes: 0.02 10*3/uL (ref 0.00–0.07)
Basophils Absolute: 0 10*3/uL (ref 0.0–0.1)
Basophils Relative: 0 %
Eosinophils Absolute: 0.6 10*3/uL — ABNORMAL HIGH (ref 0.0–0.5)
Eosinophils Relative: 11 %
HCT: 40.6 % (ref 36.0–46.0)
Hemoglobin: 13.4 g/dL (ref 12.0–15.0)
Immature Granulocytes: 0 %
Lymphocytes Relative: 15 %
Lymphs Abs: 0.8 10*3/uL (ref 0.7–4.0)
MCH: 30.3 pg (ref 26.0–34.0)
MCHC: 33 g/dL (ref 30.0–36.0)
MCV: 91.9 fL (ref 80.0–100.0)
Monocytes Absolute: 0.7 10*3/uL (ref 0.1–1.0)
Monocytes Relative: 13 %
Neutro Abs: 3.4 10*3/uL (ref 1.7–7.7)
Neutrophils Relative %: 61 %
Platelet Count: 280 10*3/uL (ref 150–400)
RBC: 4.42 MIL/uL (ref 3.87–5.11)
RDW: 14.8 % (ref 11.5–15.5)
WBC Count: 5.5 10*3/uL (ref 4.0–10.5)
nRBC: 0 % (ref 0.0–0.2)

## 2020-11-18 LAB — CMP (CANCER CENTER ONLY)
ALT: 18 U/L (ref 0–44)
AST: 25 U/L (ref 15–41)
Albumin: 3.7 g/dL (ref 3.5–5.0)
Alkaline Phosphatase: 65 U/L (ref 38–126)
Anion gap: 7 (ref 5–15)
BUN: 11 mg/dL (ref 8–23)
CO2: 28 mmol/L (ref 22–32)
Calcium: 8.6 mg/dL — ABNORMAL LOW (ref 8.9–10.3)
Chloride: 100 mmol/L (ref 98–111)
Creatinine: 1.12 mg/dL — ABNORMAL HIGH (ref 0.44–1.00)
GFR, Estimated: 51 mL/min — ABNORMAL LOW (ref >60)
Glucose, Bld: 108 mg/dL — ABNORMAL HIGH (ref 70–99)
Potassium: 3.6 mmol/L (ref 3.5–5.1)
Sodium: 135 mmol/L (ref 135–145)
Total Bilirubin: 0.4 mg/dL (ref 0.3–1.2)
Total Protein: 6.8 g/dL (ref 6.5–8.1)

## 2020-11-18 LAB — C-REACTIVE PROTEIN: CRP: 1.3 mg/dL — ABNORMAL HIGH (ref <1.0)

## 2020-11-25 ENCOUNTER — Encounter: Payer: Self-pay | Admitting: Adult Health

## 2020-11-25 ENCOUNTER — Other Ambulatory Visit: Payer: Self-pay

## 2020-11-25 ENCOUNTER — Inpatient Hospital Stay: Payer: Medicare Other | Admitting: Adult Health

## 2020-11-25 VITALS — BP 110/75 | HR 79 | Temp 97.9°F | Resp 20 | Ht 66.0 in | Wt 132.1 lb

## 2020-11-25 DIAGNOSIS — Z170 Estrogen receptor positive status [ER+]: Secondary | ICD-10-CM

## 2020-11-25 DIAGNOSIS — Z853 Personal history of malignant neoplasm of breast: Secondary | ICD-10-CM | POA: Diagnosis not present

## 2020-11-25 DIAGNOSIS — C50911 Malignant neoplasm of unspecified site of right female breast: Secondary | ICD-10-CM

## 2020-11-25 NOTE — Progress Notes (Signed)
CLINIC:  Survivorship   REASON FOR VISIT:  Routine follow-up for history of breast cancer.   BRIEF ONCOLOGIC HISTORY:  Per Dr. Jana Hakim note from 06/21/2017:  [place] woman: history of right breast cancer: pT1c pN0, stage IA, estrogen and progesterone receptor positive.  1. S/p right mastectomy with myocutaneous flap 09/19/2009  2. Adjuvant chemo with 4 cycles of  Taxotere/ Cytoxan  3. Femara, followed by Arimidex, started roughly Jan 2011, with a few months' interruption  4.  Completed 5 years of antiestrogen therapy June 2016  5.  Also s/p left breast lumpectomy in may 2014 ,negative for atypia or malignancy  6. Intermittent thrombocytosis, mild likely multifocal factorial (borderline iron deficiency, positive ANA and elevated C-reactive protein)   INTERVAL HISTORY:  [name] presents to the Survivorship Clinic today for routine follow-up for her history of breast cancer.  Overall, she reports feeling quite well.   Her most recent mammogram was completed in 05/2019 that was normal.  She has struggled with shortness of breath, COPD and CHF exacerbations, and has had close follow-up with Dr. Chase Caller.  She also is waiting on results from a skin biopsy she had at dermatology for a rash that she has been experiencing for the past 8 weeks.  She is wearing oxygen today and she notes that she wears it when needed, particularly when she goes out.   She has no breast concerns.     REVIEW OF SYSTEMS:  Review of Systems  Constitutional: Positive for fatigue. Negative for appetite change, chills, fever and unexpected weight change.  HENT:   Negative for hearing loss, lump/mass and trouble swallowing.   Eyes: Negative for eye problems and icterus.  Respiratory: Positive for shortness of breath. Negative for chest tightness and cough.   Cardiovascular: Negative for chest pain, leg swelling and palpitations.  Gastrointestinal: Negative for abdominal distention, abdominal pain,  constipation, diarrhea, nausea and vomiting.  Endocrine: Negative for hot flashes.  Genitourinary: Negative for difficulty urinating.   Musculoskeletal: Negative for arthralgias and gait problem.  Skin: Positive for itching and rash.  Neurological: Negative for dizziness, extremity weakness, gait problem, headaches and numbness.  Hematological: Negative for adenopathy. Does not bruise/bleed easily.  Psychiatric/Behavioral: Negative for depression. The patient is not nervous/anxious.   Breast: Denies any new nodularity, masses, tenderness, nipple changes, or nipple discharge.       PAST MEDICAL/SURGICAL HISTORY:  Past Medical History:  Diagnosis Date  . Bilateral leg cramps   . Bladder neoplasm   . Cancer ([place])   . CHF (congestive heart failure) ([place])   . Chronic deep vein thrombosis (DVT) of right lower extremity ([place])    05/ 2018  chronic non-occlusive DVT RLE  . COPD, frequent exacerbations ([place])    03-06-2018 per pt last exacerbation 12/ 2018  . Coronary artery disease    cardiologist-  dr Aundra Dubin-- 03-11-2018 er cath , 80% stenosis in the ostial second diagonal  . Dyspnea on minimal exertion   . Emphysema/COPD ([place])    CAT score- 17  . Family history of breast cancer   . Family history of colon cancer   . Family history of melanoma   . Family history of ovarian cancer   . Family history of pancreatic cancer   . Fibromyalgia 1995  . GERD (gastroesophageal reflux disease)   . Hiatal hernia   . History of breast cancer   . History of diverticulitis of colon    2002  s/p  sigmoid colectomy  . History of  multiple pulmonary nodules    hx RUL nodules x2  s/p right VATS w/ wedge resection 09-11-2005 and 06-14-2011  both  necrotizing granulomatous inflammation w/ cystic area of necrosis & focal calcification  . History of right breast cancer oncologist-  dr Jana Hakim-- no recurrence   dx 2010--  IDC, Stage IA , ER/PR+,  (pT1c pN0) 11-09-2008 right lumpectomy;  12-18-2008 right  simple mastectomy for DCIS margins;  completed chemotherapy 2010 (no radiation) and completed antiestrogen therapy  . Hx of colonic polyps    Dr. Cristina Gong -last study '11  . Hyperlipidemia   . Hypertension   . Hypothyroidism   . 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 Nocturia   . OA (osteoarthritis)    thumbs  . On supplemental oxygen therapy    03-06-2018 per pt uses only at night,  checks O2 sats at home,  stated am sat 89% after moving around average 93-94% with RA  . Osteoporosis   . Peripheral arterial occlusive disease [facility] vascular-- dr chen/ dr Donzetta Matters   proximal right SFA severe focal stenosis with collaterals from the PFA/  04/ 2012 occluded celiac and SMA arteries with distal reconstitution w/ patent IMA  . Peripheral vascular disease ([place])    chronic DVT RLE,  mesenteric vascular disease  . Right lower lobe pulmonary nodule    Chest CT 08-29-2017  . Stage 3 severe COPD by GOLD classification ([place]) hx frequent exacerbations--   pulmologist-  dr Maryann Alar--  per lov note , dated 12-20-2017, oxyogen 2L is prescribed for use with exertion (but pt only uses mostly at night), O2 sats on RA run the 80s , this day sat 86% RA and with 2L O2 sat 92%  . Varicose veins of leg with swelling    varicose vein surgery - Dr. Aleda Grana  . Wears glasses   . Wears hearing aid in both ears    Past Surgical History:  Procedure Laterality Date  . ABDOMINAL AORTOGRAM N/A 07/11/2020   Procedure: ABDOMINAL AORTOGRAM;  Surgeon: Waynetta Sandy, MD;  Location: [place] [facility];  Service: Cardiovascular;  Laterality: N/A;  . AORTIC ARCH ANGIOGRAPHY N/A 07/11/2020   Procedure: AORTIC ARCH ANGIOGRAPHY;  Surgeon: Waynetta Sandy, MD;  Location: [place] [facility];  Service: Cardiovascular;  Laterality: N/A;  . BREAST EXCISIONAL BIOPSY Left 10/15/2008  . BREAST LUMPECTOMY W/ NEEDLE LOCALIZATION Right 11-09-2008    dr Brantley Stage   [facility]  . CARDIAC  CATHETERIZATION  03-12-2011   dr Aundra Dubin   70-80% ostial stenosis in small second diagonal (appears to small for intervention),  dLAD 40-50%,  minimal luminal irregulartieis involoving LCFx and RCA,  LVEF 55%  . CATARACT EXTRACTION W/ INTRAOCULAR LENS  IMPLANT, BILATERAL  2011  . CYSTOSCOPY N/A 03/11/2018   Procedure: CYSTOSCOPY WITH INSTILLATION OF POST OPERATIVE EPIRUBICIN;  Surgeon: Festus Aloe, MD;  Location: [facility];  Service: Urology;  Laterality: N/A;  . LEFT HEART CATH AND CORONARY ANGIOGRAPHY N/A 05/25/2020   Procedure: LEFT HEART CATH AND CORONARY ANGIOGRAPHY;  Surgeon: Troy Sine, MD;  Location: [place] [facility];  Service: Cardiovascular;  Laterality: N/A;  . MASTECTOMY Right   . PARTIAL COLECTOMY  2002   sigmoid and appectomy (diverticulitis)  . PARTIAL MASTECTOMY WITH NEEDLE LOCALIZATION Left 02/23/2013   Procedure:  LEFT PARTIAL MASTECTOMY WITH NEEDLE LOCALIZATION;  Surgeon: Adin Hector, MD;  Location: [place];  Service: General;  Laterality: Left;  . PERIPHERAL VASCULAR  INTERVENTION  07/11/2020   Procedure: PERIPHERAL VASCULAR INTERVENTION;  Surgeon: Waynetta Sandy, MD;  Location: [place] [facility];  Service: Cardiovascular;;  . RECONSTRUCTION BREAST W/ LATISSIMUS DORSI FLAP Right 05-30-2009   dr Harlow Mares  [facility]  . REDUCTION MAMMAPLASTY Left   . SIMPLE MASTECTOMY Right 12-28-2008    dr Brantley Stage  [facility]  . TRANSTHORACIC ECHOCARDIOGRAM  07-09-2016  dr Aundra Dubin   ef 55-60%, grade 1 diastoic dysfunction/  mild TR  . TRANSURETHRAL RESECTION OF BLADDER TUMOR N/A 03/11/2018   Procedure: TRANSURETHRAL RESECTION OF BLADDER TUMOR (TURBT) 2-5cm;  Surgeon: Festus Aloe, MD;  Location[facility];  Service: Urology;  Laterality: N/A;  . UPPER EXTREMITY ANGIOGRAPHY Left 07/11/2020   Procedure: Upper Extremity Angiography;  Surgeon: Waynetta Sandy, MD;  Location: [place] [facility];  Service: Cardiovascular;  Laterality: Left;  Marland Kitchen VAGINAL HYSTERECTOMY   1973  . VIDEO ASSISTED THORACOSCOPY (VATS)/WEDGE RESECTION Right 09-11-2005  &  06-14-2011   dr hendrickso  [facility]   both RUL     ALLERGIES:  Allergies  Allergen Reactions  . Sulfa Antibiotics Swelling     CURRENT MEDICATIONS:  Outpatient Encounter Medications as of 11/25/2020  Medication Sig  . acetaminophen (TYLENOL) 325 MG tablet Take 2 tablets (650 mg total) by mouth every 6 (six) hours as needed for mild pain (or Fever >/= 101).  Marland Kitchen albuterol (VENTOLIN HFA) 108 (90 Base) MCG/ACT inhaler TAKE 2 PUFFS BY MOUTH EVERY 6 HOURS AS NEEDED FOR WHEEZE OR SHORTNESS OF BREATH  . amLODipine (NORVASC) 5 MG tablet 1 tablet  . aspirin EC 81 MG tablet Take 1 tablet (81 mg total) by mouth daily.  Marland Kitchen atorvastatin (LIPITOR) 80 MG tablet TAKE 1 TABLET BY MOUTH EVERY DAY  . benzonatate (TESSALON PERLES) 100 MG capsule Take 1 capsule (100 mg total) by mouth 3 (three) times daily as needed for cough.  . Biotin 10000 MCG TABS Take 10,000 mcg by mouth daily.  . bisacodyl (WOMENS LAXATIVE) 5 MG EC tablet 1 tablet  . budesonide (PULMICORT) 0.5 MG/2ML nebulizer solution Take 2 mLs (0.5 mg total) by nebulization 2 (two) times daily.  . cilostazol (PLETAL) 100 MG tablet Take 1 tablet (100 mg total) by mouth 2 (two) times daily.  . clopidogrel (PLAVIX) 75 MG tablet Take 1 tablet (75 mg total) by mouth daily.  Marland Kitchen dextromethorphan-guaiFENesin (MUCINEX DM) 30-600 MG 12hr tablet Take 1 tablet by mouth 2 (two) times daily as needed for cough.   . diclofenac (VOLTAREN) 75 MG EC tablet 1 tablet  . fluticasone (FLONASE) 50 MCG/ACT nasal spray USE 1 SPRAY INTO EACH NOSTRIL TWICE A DAY  . Fluticasone-Umeclidin-Vilant (TRELEGY ELLIPTA) 200-62.5-25 MCG/INH AEPB Inhale 1 puff into the lungs daily.  . furosemide (LASIX) 40 MG tablet TAKE 1 TABLET BY MOUTH TWICE A DAY  . HYDROcodone-homatropine (HYCODAN) 5-1.5 MG/5ML syrup Take 5 mLs by mouth every 6 (six) hours as needed for cough.  Marland Kitchen ipratropium-albuterol (DUONEB) 0.5-2.5 (3)  MG/3ML SOLN Inhale 3 mLs into the lungs every 6 (six) hours as needed (sob, wheezing).  Marland Kitchen KLOR-CON M20 20 MEQ tablet TAKE 1 TABLET BY MOUTH 3 TIMES DAILY.  Marland Kitchen levothyroxine (SYNTHROID) 112 MCG tablet TAKE 1 TABLET (112 MCG TOTAL) BY MOUTH DAILY BEFORE BREAKFAST.  Marland Kitchen linaclotide (LINZESS) 145 MCG CAPS capsule 1 capsule at least 30 minutes before the first meal of the day on an empty stomach  . LORazepam (ATIVAN) 2 MG tablet Take 1 tablet (2 mg total) by mouth at bedtime.  Marland Kitchen  metoprolol tartrate (LOPRESSOR) 25 MG tablet 1 tablet  . montelukast (SINGULAIR) 10 MG tablet Take 1 tablet (10 mg total) by mouth at bedtime.  . pantoprazole (PROTONIX) 40 MG tablet TAKE 1 TABLET BY MOUTH EVERY DAY  . PARoxetine (PAXIL) 20 MG tablet TAKE 1 TABLET BY MOUTH EVERYDAY AT BEDTIME  . tamsulosin (FLOMAX) 0.4 MG CAPS capsule Take 1 capsule (0.4 mg total) by mouth daily.  . traZODone (DESYREL) 100 MG tablet TAKE 1 TABLET BY MOUTH EVERYDAY AT BEDTIME   No facility-administered encounter medications on file as of 11/25/2020.     ONCOLOGIC FAMILY HISTORY:  Family History  Problem Relation Age of Onset  . Colon cancer Mother        colon  . COPD Mother        brown lung  . Hypertension Mother   . Diabetes Mother   . Emphysema Mother   . Coronary artery disease Father   . Heart attack Father   . Sudden death Father   . Heart disease Father   . Cancer Sister        throat  . Hypertension Sister   . Coronary artery disease Brother   . Heart attack Brother        early 78s  . Throat cancer Sister   . Ovarian cancer Sister        fallopian tube cancer in her 89s  . Melanoma Sister   . Hypertension Sister   . Pancreatic cancer Sister   . Melanoma Niece        dx in her 43s  . Breast cancer Niece 61    GENETIC COUNSELING/TESTING: Genetic testing reported out on December 09, 2018 through the multi-cancer panel found no deleterious mutations.  The Multi-Gene Panel offered by [facility] includes sequencing  and/or deletion duplication testing of the following 84 genes: AIP, ALK, APC, ATM, AXIN2,BAP1,  BARD1, BLM, BMPR1A, BRCA1, BRCA2, BRIP1, CASR, CDC73, CDH1, CDK4, CDKN1B, CDKN1C, CDKN2A (p14ARF), CDKN2A (p16INK4a), CEBPA, CHEK2, CTNNA1, DICER1, DIS3L2, EGFR (c.2369C>T, p.Thr790Met variant only), EPCAM (Deletion/duplication testing only), FH, FLCN, GATA2, GPC3, GREM1 (Promoter region deletion/duplication testing only), HOXB13 (c.251G>A, p.Gly84Glu), HRAS, KIT, MAX, MEN1, MET, MITF (c.952G>A, p.Glu318Lys variant only), MLH1, MSH2, MSH3, MSH6, MUTYH, NBN, NF1, NF2, NTHL1, PALB2, PDGFRA, PHOX2B, PMS2, POLD1, POLE, POT1, PRKAR1A, PTCH1, PTEN, RAD50, RAD51C, RAD51D, RB1, RECQL4, RET, RUNX1, SDHAF2, SDHA (sequence changes only), SDHB, SDHC, SDHD, SMAD4, SMARCA4, SMARCB1, SMARCE1, STK11, SUFU, TERC, TERT, TMEM127, TP53, TSC1, TSC2, VHL, WRN and WT1.   The test report has been scanned into EPIC and is located under the Molecular Pathology section of the Results Review tab.   SOCIAL HISTORY:  ABUK SELLECK is married and lives with her husband in [place], [place].  Her son recently passed away.  Her younger son recently moved in to help care for her and her husband.  Ms. Thorley is currently retired.  She denies any current or history of tobacco, alcohol, or illicit drug use.  (reviewed 11/24/2019)   PHYSICAL EXAMINATION:  Vital Signs: Vitals:   11/25/20 1111  BP: 110/75  Pulse: 79  Resp: 20  Temp: 97.9 F (36.6 C)  SpO2: 94%   Filed Weights   11/25/20 1111  Weight: 132 lb 1.6 oz (59.9 kg)   General: Well-nourished, well-appearing female in no acute distress.  Unaccompanied today.   HEENT: Head is normocephalic.  Pupils equal and reactive to light. Conjunctivae clear without exudate.  Sclerae anicteric. Oral mucosa is pink, moist.  Oropharynx is  pink without lesions or erythema.  Lymph: No cervical, supraclavicular, or infraclavicular lymphadenopathy noted on palpation.  Cardiovascular: Regular  rate and rhythm.Marland Kitchen Respiratory: wheezes in upper airways, diminished/decreased air movement in bilateral lower lungs Breast Exam:  -Left breast: No appreciable masses on palpation. No skin redness, thickening, or peau d'orange appearance; no nipple retraction or nipple discharge; mild distortion in symmetry at previous lumpectomy site well healed scar without erythema or nodularity.  -Right breast: s/p mastecomy and reconstruction, well healed, no swelling or sign of recurrence. -Axilla: No axillary adenopathy bilaterally.  GI: Abdomen soft and round; non-tender, non-distended. Bowel sounds normoactive. No hepatosplenomegaly.   GU: Deferred.  Neuro: No focal deficits. Steady gait.  Psych: Mood and affect normal and appropriate for situation.  MSK: No focal spinal tenderness to palpation, full range of motion in bilateral upper extremities Extremities: No edema. Skin: Warm and dry.  LABORATORY DATA: No visits with results within 1 Day(s) from this visit.  Latest known visit with results is:  Appointment on 11/18/2020  Component Date Value Ref Range Status  . Sodium 11/18/2020 135  135 - 145 mmol/L Final  . Potassium 11/18/2020 3.6  3.5 - 5.1 mmol/L Final  . Chloride 11/18/2020 100  98 - 111 mmol/L Final  . CO2 11/18/2020 28  22 - 32 mmol/L Final  . Glucose, Bld 11/18/2020 108* 70 - 99 mg/dL Final   Glucose reference range applies only to samples taken after fasting for at least 8 hours.  . BUN 11/18/2020 11  8 - 23 mg/dL Final  . Creatinine 11/18/2020 1.12* 0.44 - 1.00 mg/dL Final  . Calcium 11/18/2020 8.6* 8.9 - 10.3 mg/dL Final  . Total Protein 11/18/2020 6.8  6.5 - 8.1 g/dL Final  . Albumin 11/18/2020 3.7  3.5 - 5.0 g/dL Final  . AST 11/18/2020 25  15 - 41 U/L Final  . ALT 11/18/2020 18  0 - 44 U/L Final  . Alkaline Phosphatase 11/18/2020 65  38 - 126 U/L Final  . Total Bilirubin 11/18/2020 0.4  0.3 - 1.2 mg/dL Final  . GFR, Estimated 11/18/2020 51* >60 mL/min Final   Comment:  (NOTE) Calculated using the CKD-EPI Creatinine Equation (2021)   . Anion gap 11/18/2020 7  5 - 15 Final   Performed at [facility], [place] [address], [place], [place] [address]  . WBC Count 11/18/2020 5.5  4.0 - 10.5 K/uL Final  . RBC 11/18/2020 4.42  3.87 - 5.11 MIL/uL Final  . Hemoglobin 11/18/2020 13.4  12.0 - 15.0 g/dL Final  . HCT 11/18/2020 40.6  36.0 - 46.0 % Final  . MCV 11/18/2020 91.9  80.0 - 100.0 fL Final  . MCH 11/18/2020 30.3  26.0 - 34.0 pg Final  . MCHC 11/18/2020 33.0  30.0 - 36.0 g/dL Final  . RDW 11/18/2020 14.8  11.5 - 15.5 % Final  . Platelet Count 11/18/2020 280  150 - 400 K/uL Final  . nRBC 11/18/2020 0.0  0.0 - 0.2 % Final  . Neutrophils Relative % 11/18/2020 61  % Final  . Neutro Abs 11/18/2020 3.4  1.7 - 7.7 K/uL Final  . Lymphocytes Relative 11/18/2020 15  % Final  . Lymphs Abs 11/18/2020 0.8  0.7 - 4.0 K/uL Final  . Monocytes Relative 11/18/2020 13  % Final  . Monocytes Absolute 11/18/2020 0.7  0.1 - 1.0 K/uL Final  . Eosinophils Relative 11/18/2020 11  % Final  . Eosinophils Absolute 11/18/2020 0.6* 0.0 - 0.5 K/uL Final  .  Basophils Relative 11/18/2020 0  % Final  . Basophils Absolute 11/18/2020 0.0  0.0 - 0.1 K/uL Final  . Immature Granulocytes 11/18/2020 0  % Final  . Abs Immature Granulocytes 11/18/2020 0.02  0.00 - 0.07 K/uL Final   Performed at [facility], [place] [address] [place], [place] [address]  . CRP 11/18/2020 1.3* <1.0 mg/dL Final   Performed at [place] [address], [place], [place] [address]      DIAGNOSTIC IMAGING:  CLINICAL DATA:  Screening.  EXAM: DIGITAL SCREENING UNILATERAL LEFT MAMMOGRAM WITH CAD AND TOMO  COMPARISON:  Previous exam(s).  ACR Breast Density Category c: The breast tissue is heterogeneously dense, which may obscure small masses.  FINDINGS: The patient has had a right mastectomy. There are no findings suspicious for  malignancy.  Images were processed with CAD.  IMPRESSION: No mammographic evidence of malignancy. A result letter of this screening mammogram will be mailed directly to the patient.  RECOMMENDATION: Screening mammogram in one year.  (Code:SM-L-44M)  BI-RADS CATEGORY  1: Negative.   Electronically Signed   By: Dorise Bullion III M.D   On: 06/04/2019 17:09     ASSESSMENT AND PLAN:  Ms.. Kinnick is a pleasant 75 y.o. female with history of Stage IA right breast invasive ductal carcinoma, ER+/PR+/HER2-, diagnosed in 2010, treated with mastectomy, adjuvant chemotherapy, Femara/Arimidex x 5 years completed in 03/2015.  Of note she is s/p left lumpectomy in 02/2013 neg for atypia or malignancy.  She also has intermittent thrombocytosis.  She presents to the Survivorship Clinic for surveillance and routine follow-up.   1. History of breast cancer:  Ms. Bily is currently clinically and radiographically without evidence of disease or recurrence of breast cancer.  She is due for repeat mammogram in 05/2021.  She notes her most recent mammogram was completed with [facility].  She will return to see me in one year for repeat long term follow up.  I encouraged her to call me with any questions or concerns before her next visit at the cancer center, and I would be happy to see her sooner, if needed.    2. COPD/CHF: She will continue to f/u with pulmonology and cardiology about these issues, these are really her primary concerns/issues at this point.    3. Bone health:  Given Ms. Witts's age, history of breast cancer, and her previous anti-estrogen therapy with aromatase inhibitors, she is at risk for bone demineralization. I will defer to her PCP regarding bone density testing and management.  She was given education on specific food and activities to promote bone health.  4. Cancer screening:  Due to Ms. Cervenka's history and her age, she should receive screening for skin cancers, colon cancer. She  was encouraged to follow-up with her PCP for appropriate cancer screenings.   5. Health maintenance and wellness promotion: Ms. Mathena was encouraged to consume 5-7 servings of fruits and vegetables per day. She was also encouraged to be as active as possibleShe was instructed to limit her alcohol consumption and continue to abstain from tobacco use.     Dispo:  -Return to cancer center in one year for LTS follow up -Mammogram in 05/2021   Total encounter time: 20 minutes*   Wilber Bihari, NP 11/25/20 2:25 PM Medical Oncology and Hematology [facility] [place], [place] [address] Tel. [phone]    Fax. [phone]   *Total Encounter Time as defined by the Centers for Medicare and  Medicaid Services includes, in addition to the face-to-face time of a patient visit (documented in the note above) non-face-to-face time: obtaining and reviewing outside history, ordering and reviewing medications, tests or procedures, care coordination (communications with other health care professionals or caregivers) and documentation in the medical record.   Note: PRIMARY CARE PROVIDER [place], [name], [place] [phone]

## 2020-11-27 ENCOUNTER — Other Ambulatory Visit: Payer: Self-pay | Admitting: Nurse Practitioner

## 2020-11-27 DIAGNOSIS — I739 Peripheral vascular disease, unspecified: Secondary | ICD-10-CM

## 2020-11-28 ENCOUNTER — Telehealth: Payer: Self-pay | Admitting: Adult Health

## 2020-11-28 ENCOUNTER — Other Ambulatory Visit: Payer: Self-pay | Admitting: Family Medicine

## 2020-11-28 NOTE — Telephone Encounter (Signed)
Scheduled appts per 2/11 los. Pt confirmed appt date and time.

## 2020-11-29 ENCOUNTER — Ambulatory Visit: Payer: Medicare Other | Admitting: Internal Medicine

## 2020-12-02 ENCOUNTER — Other Ambulatory Visit: Payer: Self-pay | Admitting: Primary Care

## 2020-12-06 ENCOUNTER — Telehealth: Payer: Self-pay | Admitting: Family Medicine

## 2020-12-06 NOTE — Telephone Encounter (Signed)
Patient is calling and wanted to see if she should be taking cilostazol and clopidogrel, please advise. CB is [phone]

## 2020-12-06 NOTE — Telephone Encounter (Signed)
I spoke with pt. These medications are handled by cardiology, she will give them a call.

## 2020-12-15 ENCOUNTER — Other Ambulatory Visit: Payer: Self-pay | Admitting: Family Medicine

## 2020-12-16 ENCOUNTER — Other Ambulatory Visit: Payer: Self-pay | Admitting: Family Medicine

## 2020-12-16 DIAGNOSIS — E039 Hypothyroidism, unspecified: Secondary | ICD-10-CM

## 2020-12-21 ENCOUNTER — Other Ambulatory Visit: Payer: Self-pay

## 2020-12-21 ENCOUNTER — Ambulatory Visit (INDEPENDENT_AMBULATORY_CARE_PROVIDER_SITE_OTHER): Payer: Medicare Other

## 2020-12-21 DIAGNOSIS — Z0000 Encounter for general adult medical examination without abnormal findings: Secondary | ICD-10-CM

## 2020-12-21 NOTE — Patient Instructions (Addendum)
[name] , Thank you for taking time to come for your Medicare Wellness Visit. I appreciate your ongoing commitment to your health goals. Please review the following plan we discussed and let me know if I can assist you in the future.   Screening recommendations/referrals: Colonoscopy: Done 12/08/14 Mammogram: No longer required Bone Density: Done 02/22/14 Recommended yearly ophthalmology/optometry visit for glaucoma screening and checkup Recommended yearly dental visit for hygiene and checkup  Vaccinations: Influenza vaccine: Due and discussed Pneumococcal vaccine:Up to date Tdap vaccine: Up to date Shingles vaccine: Shingrix discussed. Please contact your pharmacy for coverage information.    Covid-19:1 dose pt declined any further administration  Advanced directives: Please bring a copy of your health care power of attorney and living will to the office at your convenience.  Conditions/risks identified: None at this time  Next appointment: Follow up in one year for your annual wellness visit    Preventive Care 65 Years and Older, Female Preventive care refers to lifestyle choices and visits with your health care provider that can promote health and wellness. What does preventive care include?  A yearly physical exam. This is also called an annual well check.  Dental exams once or twice a year.  Routine eye exams. Ask your health care provider how often you should have your eyes checked.  Personal lifestyle choices, including:  Daily care of your teeth and gums.  Regular physical activity.  Eating a healthy diet.  Avoiding tobacco and drug use.  Limiting alcohol use.  Practicing safe sex.  Taking low-dose aspirin every day.  Taking vitamin and mineral supplements as recommended by your health care provider. What happens during an annual well check? The services and screenings done by your health care provider during your annual well check will depend on your age,  overall health, lifestyle risk factors, and family history of disease. Counseling  Your health care provider may ask you questions about your:  Alcohol use.  Tobacco use.  Drug use.  Emotional well-being.  Home and relationship well-being.  Sexual activity.  Eating habits.  History of falls.  Memory and ability to understand (cognition).  Work and work Statistician.  Reproductive health. Screening  You may have the following tests or measurements:  Height, weight, and BMI.  Blood pressure.  Lipid and cholesterol levels. These may be checked every 5 years, or more frequently if you are over 49 years old.  Skin check.  Lung cancer screening. You may have this screening every year starting at age 66 if you have a 30-pack-year history of smoking and currently smoke or have quit within the past 15 years.  Fecal occult blood test (FOBT) of the stool. You may have this test every year starting at age 28.  Flexible sigmoidoscopy or colonoscopy. You may have a sigmoidoscopy every 5 years or a colonoscopy every 10 years starting at age 79.  Hepatitis C blood test.  Hepatitis B blood test.  Sexually transmitted disease (STD) testing.  Diabetes screening. This is done by checking your blood sugar (glucose) after you have not eaten for a while (fasting). You may have this done every 1-3 years.  Bone density scan. This is done to screen for osteoporosis. You may have this done starting at age 24.  Mammogram. This may be done every 1-2 years. Talk to your health care provider about how often you should have regular mammograms. Talk with your health care provider about your test results, treatment options, and if necessary, the need for  more tests. Vaccines  Your health care provider may recommend certain vaccines, such as:  Influenza vaccine. This is recommended every year.  Tetanus, diphtheria, and acellular pertussis (Tdap, Td) vaccine. You may need a Td booster every 10  years.  Zoster vaccine. You may need this after age 34.  Pneumococcal 13-valent conjugate (PCV13) vaccine. One dose is recommended after age 1.  Pneumococcal polysaccharide (PPSV23) vaccine. One dose is recommended after age 11. Talk to your health care provider about which screenings and vaccines you need and how often you need them. This information is not intended to replace advice given to you by your health care provider. Make sure you discuss any questions you have with your health care provider. Document Released: 10/28/2015 Document Revised: 06/20/2016 Document Reviewed: 08/02/2015 Elsevier Interactive Patient Education  2017 [employer] Prevention in the Home Falls can cause injuries. They can happen to people of all ages. There are many things you can do to make your home safe and to help prevent falls. What can I do on the outside of my home?  Regularly fix the edges of walkways and driveways and fix any cracks.  Remove anything that might make you trip as you walk through a door, such as a raised step or threshold.  Trim any bushes or trees on the path to your home.  Use bright outdoor lighting.  Clear any walking paths of anything that might make someone trip, such as rocks or tools.  Regularly check to see if handrails are loose or broken. Make sure that both sides of any steps have handrails.  Any raised decks and porches should have guardrails on the edges.  Have any leaves, snow, or ice cleared regularly.  Use sand or salt on walking paths during winter.  Clean up any spills in your garage right away. This includes oil or grease spills. What can I do in the bathroom?  Use night lights.  Install grab bars by the toilet and in the tub and shower. Do not use towel bars as grab bars.  Use non-skid mats or decals in the tub or shower.  If you need to sit down in the shower, use a plastic, non-slip stool.  Keep the floor dry. Clean up any water that  spills on the floor as soon as it happens.  Remove soap buildup in the tub or shower regularly.  Attach bath mats securely with double-sided non-slip rug tape.  Do not have throw rugs and other things on the floor that can make you trip. What can I do in the bedroom?  Use night lights.  Make sure that you have a light by your bed that is easy to reach.  Do not use any sheets or blankets that are too big for your bed. They should not hang down onto the floor.  Have a firm chair that has side arms. You can use this for support while you get dressed.  Do not have throw rugs and other things on the floor that can make you trip. What can I do in the kitchen?  Clean up any spills right away.  Avoid walking on wet floors.  Keep items that you use a lot in easy-to-reach places.  If you need to reach something above you, use a strong step stool that has a grab bar.  Keep electrical cords out of the way.  Do not use floor polish or wax that makes floors slippery. If you must use wax, use non-skid  floor wax.  Do not have throw rugs and other things on the floor that can make you trip. What can I do with my stairs?  Do not leave any items on the stairs.  Make sure that there are handrails on both sides of the stairs and use them. Fix handrails that are broken or loose. Make sure that handrails are as long as the stairways.  Check any carpeting to make sure that it is firmly attached to the stairs. Fix any carpet that is loose or worn.  Avoid having throw rugs at the top or bottom of the stairs. If you do have throw rugs, attach them to the floor with carpet tape.  Make sure that you have a light switch at the top of the stairs and the bottom of the stairs. If you do not have them, ask someone to add them for you. What else can I do to help prevent falls?  Wear shoes that:  Do not have high heels.  Have rubber bottoms.  Are comfortable and fit you well.  Are closed at the  toe. Do not wear sandals.  If you use a stepladder:  Make sure that it is fully opened. Do not climb a closed stepladder.  Make sure that both sides of the stepladder are locked into place.  Ask someone to hold it for you, if possible.  Clearly mark and make sure that you can see:  Any grab bars or handrails.  First and last steps.  Where the edge of each step is.  Use tools that help you move around (mobility aids) if they are needed. These include:  Canes.  Walkers.  Scooters.  Crutches.  Turn on the lights when you go into a dark area. Replace any light bulbs as soon as they burn out.  Set up your furniture so you have a clear path. Avoid moving your furniture around.  If any of your floors are uneven, fix them.  If there are any pets around you, be aware of where they are.  Review your medicines with your doctor. Some medicines can make you feel dizzy. This can increase your chance of falling. Ask your doctor what other things that you can do to help prevent falls. This information is not intended to replace advice given to you by your health care provider. Make sure you discuss any questions you have with your health care provider. Document Released: 07/28/2009 Document Revised: 03/08/2016 Document Reviewed: 11/05/2014 Elsevier Interactive Patient Education  2017 [employer].

## 2020-12-21 NOTE — Progress Notes (Addendum)
Virtual Visit via Telephone Note  I connected with  [name] on 12/21/20 at  1:00 PM EST by telephone and verified that I am speaking with the correct person using two identifiers.  Medicare Annual Wellness visit completed telephonically due to Covid-19 pandemic.   Persons participating in this call: This Health Coach and this patient.   Location: Patient: Home Provider: Office   I discussed the limitations, risks, security and privacy concerns of performing an evaluation and management service by telephone and the availability of in person appointments. The patient expressed understanding and agreed to proceed.  Unable to perform video visit due to video visit attempted and failed and/or patient does not have video capability.   Some vital signs may be absent or patient reported.   Willette Brace, LPN    Subjective:   [name] is a 75 y.o. female who presents for Medicare Annual (Subsequent) preventive examination.  Review of Systems     Cardiac Risk Factors include: advanced age (>64men, >16 women);hypertension;dyslipidemia     Objective:    There were no vitals filed for this visit. There is no height or weight on file to calculate BMI.  Advanced Directives 12/21/2020 11/25/2020 09/11/2020 09/10/2020 08/15/2020 07/27/2020 07/11/2020  Does Patient Have a Medical Advance Directive? Yes Yes - No Yes Yes Yes  Type of Paramedic of [place];Living will [place];Living will - - [place];Living will [place];Living will [place];Living will  Does patient want to make changes to medical advance directive? - - - - No - Patient declined No - Patient declined -  Copy of [place] in Chart? No - copy requested - - - No - copy requested No - copy requested No - copy requested  Would patient like information on creating a medical advance directive? - - No -  Patient declined - - - -  Pre-existing out of facility DNR order (yellow form or pink MOST form) - - - - - - -    Current Medications (verified) Outpatient Encounter Medications as of 12/21/2020  Medication Sig  . acetaminophen (TYLENOL) 325 MG tablet Take 2 tablets (650 mg total) by mouth every 6 (six) hours as needed for mild pain (or Fever >/= 101).  Marland Kitchen albuterol (VENTOLIN HFA) 108 (90 Base) MCG/ACT inhaler TAKE 2 PUFFS BY MOUTH EVERY 6 HOURS AS NEEDED FOR WHEEZE OR SHORTNESS OF BREATH  . amLODipine (NORVASC) 5 MG tablet 1 tablet  . aspirin EC 81 MG tablet Take 1 tablet (81 mg total) by mouth daily.  Marland Kitchen atorvastatin (LIPITOR) 80 MG tablet TAKE 1 TABLET BY MOUTH EVERY DAY  . benzonatate (TESSALON PERLES) 100 MG capsule Take 1 capsule (100 mg total) by mouth 3 (three) times daily as needed for cough.  . Biotin 10000 MCG TABS Take 10,000 mcg by mouth daily.  . budesonide (PULMICORT) 0.5 MG/2ML nebulizer solution Take 2 mLs (0.5 mg total) by nebulization 2 (two) times daily.  . cilostazol (PLETAL) 100 MG tablet TAKE 1 TABLET BY MOUTH TWICE A DAY  . clopidogrel (PLAVIX) 75 MG tablet Take 1 tablet (75 mg total) by mouth daily.  Marland Kitchen dextromethorphan-guaiFENesin (MUCINEX DM) 30-600 MG 12hr tablet Take 1 tablet by mouth 2 (two) times daily as needed for cough.   . diclofenac (VOLTAREN) 75 MG EC tablet 1 tablet  . fluticasone (FLONASE) 50 MCG/ACT nasal spray SPRAY 1 SPRAY INTO EACH NOSTRIL TWICE A DAY  .  furosemide (LASIX) 40 MG tablet TAKE 1 TABLET BY MOUTH TWICE A DAY  . hydrOXYzine (ATARAX/VISTARIL) 25 MG tablet Take 25 mg by mouth daily.  Marland Kitchen ipratropium-albuterol (DUONEB) 0.5-2.5 (3) MG/3ML SOLN Inhale 3 mLs into the lungs every 6 (six) hours as needed (sob, wheezing).  Marland Kitchen KLOR-CON M20 20 MEQ tablet TAKE 1 TABLET BY MOUTH 3 TIMES DAILY.  Marland Kitchen levothyroxine (SYNTHROID) 112 MCG tablet TAKE 1 TABLET BY MOUTH EVERY DAY IN THE MORNING BEFORE BREAKFAST  . linaclotide (LINZESS) 145 MCG CAPS capsule 1 capsule at  least 30 minutes before the first meal of the day on an empty stomach  . LORazepam (ATIVAN) 2 MG tablet Take 1 tablet (2 mg total) by mouth at bedtime.  . metoprolol tartrate (LOPRESSOR) 25 MG tablet 1 tablet  . montelukast (SINGULAIR) 10 MG tablet Take 1 tablet (10 mg total) by mouth at bedtime.  . pantoprazole (PROTONIX) 40 MG tablet TAKE 1 TABLET BY MOUTH EVERY DAY  . PARoxetine (PAXIL) 20 MG tablet TAKE 1 TABLET BY MOUTH EVERYDAY AT BEDTIME  . tamsulosin (FLOMAX) 0.4 MG CAPS capsule Take 1 capsule (0.4 mg total) by mouth daily.  . traZODone (DESYREL) 100 MG tablet TAKE 1 TABLET BY MOUTH EVERYDAY AT BEDTIME  . [place] 200-62.5-25 MCG/INH AEPB TAKE 1 PUFF BY MOUTH EVERY DAY  . [DISCONTINUED] bisacodyl (WOMENS LAXATIVE) 5 MG EC tablet 1 tablet (Patient not taking: Reported on 12/21/2020)  . [DISCONTINUED] HYDROcodone-homatropine (HYCODAN) 5-1.5 MG/5ML syrup Take 5 mLs by mouth every 6 (six) hours as needed for cough. (Patient not taking: Reported on 12/21/2020)   No facility-administered encounter medications on file as of 12/21/2020.    Allergies (verified) Sulfa antibiotics   History: Past Medical History:  Diagnosis Date  . Bilateral leg cramps   . Bladder neoplasm   . Cancer ([place])   . CHF (congestive heart failure) ([place])   . Chronic deep vein thrombosis (DVT) of right lower extremity ([place])    05/ 2018  chronic non-occlusive DVT RLE  . COPD, frequent exacerbations ([place])    03-06-2018 per pt last exacerbation 12/ 2018  . Coronary artery disease    cardiologist-  dr Aundra Dubin-- 03-11-2018 er cath , 80% stenosis in the ostial second diagonal  . Dyspnea on minimal exertion   . Emphysema/COPD ([place])    CAT score- 17  . Family history of breast cancer   . Family history of colon cancer   . Family history of melanoma   . Family history of ovarian cancer   . Family history of pancreatic cancer   . Fibromyalgia 1995  . GERD (gastroesophageal reflux disease)   . Hiatal hernia   .  History of breast cancer   . History of diverticulitis of colon    2002  s/p  sigmoid colectomy  . History of multiple pulmonary nodules    hx RUL nodules x2  s/p right VATS w/ wedge resection 09-11-2005 and 06-14-2011  both  necrotizing granulomatous inflammation w/ cystic area of necrosis & focal calcification  . History of right breast cancer oncologist-  dr Jana Hakim-- no recurrence   dx 2010--  IDC, Stage IA , ER/PR+,  (pT1c pN0) 11-09-2008 right lumpectomy;  12-18-2008 right simple mastectomy for DCIS margins;  completed chemotherapy 2010 (no radiation) and completed antiestrogen therapy  . Hx of colonic polyps    Dr. Cristina Gong -last study '11  . Hyperlipidemia   . Hypertension   . Hypothyroidism   . 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 Nocturia   . OA (osteoarthritis)    thumbs  . On supplemental oxygen therapy    03-06-2018 per pt uses only at night,  checks O2 sats at home,  stated am sat 89% after moving around average 93-94% with RA  . Osteoporosis   . Peripheral arterial occlusive disease [facility]) vascular-- dr chen/ dr Donzetta Matters   proximal right SFA severe focal stenosis with collaterals from the PFA/  04/ 2012 occluded celiac and SMA arteries with distal reconstitution w/ patent IMA  . Peripheral vascular disease ([place])    chronic DVT RLE,  mesenteric vascular disease  . Right lower lobe pulmonary nodule    Chest CT 08-29-2017  . Stage 3 severe COPD by GOLD classification ([place]) hx frequent exacerbations--   pulmologist-  dr Maryann Alar--  per lov note , dated 12-20-2017, oxyogen 2L is prescribed for use with exertion (but pt only uses mostly at night), O2 sats on RA run the 80s , this day sat 86% RA and with 2L O2 sat 92%  . Varicose veins of leg with swelling    varicose vein surgery - Dr. Aleda Grana  . Wears glasses   . Wears hearing aid in both ears    Past Surgical History:  Procedure Laterality Date  . ABDOMINAL AORTOGRAM N/A 07/11/2020    Procedure: ABDOMINAL AORTOGRAM;  Surgeon: Waynetta Sandy, MD;  Location: [place] [facility];  Service: Cardiovascular;  Laterality: N/A;  . AORTIC ARCH ANGIOGRAPHY N/A 07/11/2020   Procedure: AORTIC ARCH ANGIOGRAPHY;  Surgeon: Waynetta Sandy, MD;  Location: [place] [facility];  Service: Cardiovascular;  Laterality: N/A;  . BREAST EXCISIONAL BIOPSY Left 10/15/2008  . BREAST LUMPECTOMY W/ NEEDLE LOCALIZATION Right 11-09-2008    dr Brantley Stage   [facility]  . CARDIAC CATHETERIZATION  03-12-2011   dr Aundra Dubin   70-80% ostial stenosis in small second diagonal (appears to small for intervention),  dLAD 40-50%,  minimal luminal irregulartieis involoving LCFx and RCA,  LVEF 55%  . CATARACT EXTRACTION W/ INTRAOCULAR LENS  IMPLANT, BILATERAL  2011  . CYSTOSCOPY N/A 03/11/2018   Procedure: CYSTOSCOPY WITH INSTILLATION OF POST OPERATIVE EPIRUBICIN;  Surgeon: Festus Aloe, MD;  Location: [facility];  Service: Urology;  Laterality: N/A;  . LEFT HEART CATH AND CORONARY ANGIOGRAPHY N/A 05/25/2020   Procedure: LEFT HEART CATH AND CORONARY ANGIOGRAPHY;  Surgeon: Troy Sine, MD;  Location: [place] [facility];  Service: Cardiovascular;  Laterality: N/A;  . MASTECTOMY Right   . PARTIAL COLECTOMY  2002   sigmoid and appectomy (diverticulitis)  . PARTIAL MASTECTOMY WITH NEEDLE LOCALIZATION Left 02/23/2013   Procedure:  LEFT PARTIAL MASTECTOMY WITH NEEDLE LOCALIZATION;  Surgeon: Adin Hector, MD;  Location: [facility];  Service: General;  Laterality: Left;  . PERIPHERAL VASCULAR INTERVENTION  07/11/2020   Procedure: PERIPHERAL VASCULAR INTERVENTION;  Surgeon: Waynetta Sandy, MD;  Location: [place] [facility];  Service: Cardiovascular;;  . RECONSTRUCTION BREAST W/ LATISSIMUS DORSI FLAP Right 05-30-2009   dr Harlow Mares  [facility]  . REDUCTION MAMMAPLASTY Left   . SIMPLE MASTECTOMY Right 12-28-2008    dr Brantley Stage  [facility]  . TRANSTHORACIC ECHOCARDIOGRAM  07-09-2016  dr Aundra Dubin   ef 55-60%,  grade 1 diastoic dysfunction/  mild TR  . TRANSURETHRAL RESECTION OF BLADDER TUMOR N/A 03/11/2018   Procedure: TRANSURETHRAL RESECTION OF BLADDER TUMOR (TURBT) 2-5cm;  Surgeon: Festus Aloe, MD;  Location: [facility];  Service: Urology;  Laterality: N/A;  . UPPER  EXTREMITY ANGIOGRAPHY Left 07/11/2020   Procedure: Upper Extremity Angiography;  Surgeon: Waynetta Sandy, MD;  Location: [place] [facility];  Service: Cardiovascular;  Laterality: Left;  Marland Kitchen VAGINAL HYSTERECTOMY  1973  . VIDEO ASSISTED THORACOSCOPY (VATS)/WEDGE RESECTION Right 09-11-2005  &  06-14-2011   dr Fara Boros  [facility]   both RUL   Family History  Problem Relation Age of Onset  . Colon cancer Mother        colon  . COPD Mother        brown lung  . Hypertension Mother   . Diabetes Mother   . Emphysema Mother   . Coronary artery disease Father   . Heart attack Father   . Sudden death Father   . Heart disease Father   . Cancer Sister        throat  . Hypertension Sister   . Coronary artery disease Brother   . Heart attack Brother        early 23s  . Throat cancer Sister   . Ovarian cancer Sister        fallopian tube cancer in her 60s  . Melanoma Sister   . Hypertension Sister   . Pancreatic cancer Sister   . Melanoma Niece        dx in her 62s  . Breast cancer Niece 54   Social History   Socioeconomic History  . Marital status: Married    Spouse name: Not on file  . Number of children: 3  . Years of education: 6  . Highest education level: Not on file  Occupational History  . Occupation: Chiropractor: RETIRED    Comment: retired  Tobacco Use  . Smoking status: Former Smoker    Packs/day: 0.75    Years: 50.00    Pack years: 37.50    Types: Cigarettes    Quit date: 05/18/2016    Years since quitting: 4.5  . Smokeless tobacco: Never Used  Vaping Use  . Vaping Use: Never used  Substance and Sexual Activity  . Alcohol use: Yes    Alcohol/week: 10.0 standard drinks    Types: 10  Cans of beer per week  . Drug use: No  . Sexual activity: Not on file  Other Topics Concern  . Not on file  Social History Narrative   HSG, beauty school. Married '69 -78 yrs/widowed; married '79 - 74yr/divorced; married '87.  2 sons - '70, '74, 1 srtep-son; 1 grandchild. 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 SO - good health.  NO history of abuse.  ACP - full code and all heroic measures.    Social Determinants of Health   Financial Resource Strain: Low Risk   . Difficulty of Paying Living Expenses: Not hard at all  Food Insecurity: No Food Insecurity  . Worried About Charity fundraiser in the Last Year: Never true  . Ran Out of Food in the Last Year: Never true  Transportation Needs: No Transportation Needs  . Lack of Transportation (Medical): No  . Lack of Transportation (Non-Medical): No  Physical Activity: Inactive  . Days of Exercise per Week: 0 days  . Minutes of Exercise per Session: 0 min  Stress: No Stress Concern Present  . Feeling of Stress : Not at all  Social Connections: Socially Isolated  . Frequency of Communication with Friends and Family: Once a week  . Frequency of Social Gatherings with Friends and Family: Never  . Attends Religious Services: Never  .  Active Member of Clubs or Organizations: No  . Attends Archivist Meetings: Never  . Marital Status: Married    Tobacco Counseling Counseling given: Not Answered   Clinical Intake:  Pre-visit preparation completed: Yes  Pain : No/denies pain     BMI - recorded: 21.33 Nutritional Status: BMI of 19-24  Normal Nutritional Risks: None Diabetes: No  How often do you need to have someone help you when you read instructions, pamphlets, or other written materials from your doctor or pharmacy?: 1 - Never  Diabetic?no  Interpreter Needed?: No  Information entered by :: Charlott Rakes, LPN   Activities of Daily Living In your present state of health, do you have any difficulty performing the  following activities: 12/21/2020 09/10/2020  Hearing? Y N  Vision? N N  Difficulty concentrating or making decisions? N N  Walking or climbing stairs? N Y  Comment - -  Dressing or bathing? N N  Doing errands, shopping? N N  Preparing Food and eating ? N -  Using the Toilet? N -  In the past six months, have you accidently leaked urine? N -  Do you have problems with loss of bowel control? N -  Managing your Medications? N -  Managing your Finances? N -  Housekeeping or managing your Housekeeping? N -  Some recent data might be hidden    Patient Care Team: [place], [name], MD as PCP - General (Family Medicine) Sueanne Margarita, MD as PCP - Cardiology (Cardiology) Melrose Nakayama, MD (Cardiothoracic Surgery) Larey Dresser, MD (Cardiology) Delice Bison Charlestine Massed, NP as Nurse Practitioner (Hematology and Oncology)  Indicate any recent Medical Services you may have received from other than [facility] providers in the past year (date may be approximate).     Assessment:   This is a routine wellness examination for Ajanae.  Hearing/Vision screen  Hearing Screening   125Hz  250Hz  500Hz  1000Hz  2000Hz  3000Hz  4000Hz  6000Hz  8000Hz   Right ear:           Left ear:           Comments: Pt wear hearing aids  Vision Screening Comments: Pt doesn't follow up   Dietary issues and exercise activities discussed: Current Exercise Habits: The patient does not participate in regular exercise at present (pt states house work no routine), Type of exercise: Other - see comments (housework)  Goals    . Patient Stated     None at this time      Depression Screen PHQ 2/9 Scores 12/21/2020 10/27/2018 05/16/2018 01/03/2018 08/12/2015  PHQ - 2 Score 0 0 0 0 0    Fall Risk Fall Risk  12/21/2020 10/27/2018 05/16/2018 08/12/2015  Falls in the past year? 0 0 No No  Number falls in past yr: 0 - - -  Injury with Fall? 0 - - -  Risk for fall due to : Impaired vision - - -  Follow up Falls prevention discussed  - - -    FALL RISK PREVENTION PERTAINING TO THE HOME:  Any stairs in or around the home? Yes  If so, are there any without handrails? No  Home free of loose throw rugs in walkways, pet beds, electrical cords, etc? Yes  Adequate lighting in your home to reduce risk of falls? Yes   ASSISTIVE DEVICES UTILIZED TO PREVENT FALLS:  Life alert? Yes  Use of a cane, walker or w/c? No  Grab bars in the bathroom? Yes  Shower chair or bench in  shower? No  Elevated toilet seat or a handicapped toilet? Yes   TIMED UP AND GO:  Was the test performed? No     Cognitive Function: 6CIT not performed pt HOH         Immunizations Immunization History  Administered Date(s) Administered  . Fluad Quad(high Dose 65+) 08/26/2019  . Influenza, High Dose Seasonal PF 10/03/2017, 06/20/2018, 09/27/2019  . Influenza,inj,Quad PF,6+ Mos 08/10/2014, 08/12/2015, 07/03/2016  . PFIZER(Purple Top)SARS-COV-2 Vaccination 01/06/2020  . Pneumococcal Conjugate-13 08/10/2014  . Pneumococcal Polysaccharide-23 08/12/2015  . Td 10/16/2007  . Zoster 11/09/2010    TDAP status: Up to date  Flu Vaccine status: Declined, Education has been provided regarding the importance of this vaccine but patient still declined. Advised may receive this vaccine at local pharmacy or Health Dept. Aware to provide a copy of the vaccination record if obtained from local pharmacy or Health Dept. Verbalized acceptance and understanding.  Pneumococcal vaccine status: Up to date  Covid-19 vaccine status: Declined, Education has been provided regarding the importance of this vaccine but patient still declined. Advised may receive this vaccine at local pharmacy or Health Dept.or vaccine clinic. Aware to provide a copy of the vaccination record if obtained from local pharmacy or Health Dept. Verbalized acceptance and understanding.  Qualifies for Shingles Vaccine? Yes   Zostavax completed Yes   Shingrix Completed?: No.    Education has been  provided regarding the importance of this vaccine. Patient has been advised to call insurance company to determine out of pocket expense if they have not yet received this vaccine. Advised may also receive vaccine at local pharmacy or Health Dept. Verbalized acceptance and understanding.  Screening Tests Health Maintenance  Topic Date Due  . Hepatitis C Screening  Never done  . COVID-19 Vaccine (2 - Pfizer risk 4-dose series) 01/06/2021 (Originally 01/27/2020)  . INFLUENZA VACCINE  01/12/2021 (Originally 05/15/2020)  . DEXA SCAN  Completed  . PNA vac Low Risk Adult  Completed  . HPV VACCINES  Aged Out  . TETANUS/TDAP  Discontinued    Health Maintenance  Health Maintenance Due  Topic Date Due  . Hepatitis C Screening  Never done    Colorectal cancer screening: No longer required.   Mammogram status: Completed 09/22/19. Repeat every year  Bone Density status: Completed 02/22/14. Results reflect: Bone density results: NORMAL. Repeat every 2 years.  Additional Screening:  Hepatitis C Screening: does qualify;  Vision Screening: Recommended annual ophthalmology exams for early detection of glaucoma and other disorders of the eye. Is the patient up to date with their annual eye exam?  No  Who is the provider or what is the name of the office in which the patient attends annual eye exams? Pt states no foloow up If pt is not established with a provider, would they like to be referred to a provider to establish care? No .   Dental Screening: Recommended annual dental exams for proper oral hygiene  Community Resource Referral / Chronic Care Management: CRR required this visit?  No   CCM required this visit?  No      Plan:     I have personally reviewed and noted the following in the patient's chart:   . Medical and social history . Use of alcohol, tobacco or illicit drugs  . Current medications and supplements . Functional ability and status . Nutritional status . Physical  activity . Advanced directives . List of other physicians . Hospitalizations, surgeries, and ER visits in previous 12 months .  Vitals . Screenings to include cognitive, depression, and falls . Referrals and appointments  In addition, I have reviewed and discussed with patient certain preventive protocols, quality metrics, and best practice recommendations. A written personalized care plan for preventive services as well as general preventive health recommendations were provided to patient.     Willette Brace, LPN   10/20/1094   Nurse Notes: None

## 2020-12-24 ENCOUNTER — Other Ambulatory Visit: Payer: Self-pay | Admitting: Family Medicine

## 2020-12-24 DIAGNOSIS — G4700 Insomnia, unspecified: Secondary | ICD-10-CM

## 2020-12-24 DIAGNOSIS — F419 Anxiety disorder, unspecified: Secondary | ICD-10-CM

## 2020-12-27 NOTE — Progress Notes (Signed)
Cardiology Office Note:    Date:  12/29/2020   ID:  Christy, [name] May 09, 1946, MRN [id number]  PCP:  [name], [name], [place]  Cardiologist:  Christy Him, MD  Advanced Practice Provider:  No care team member to display Electrophysiologist:  None    Referring MD: [name], [name], MD    History of Present Illness:    [name] is a 75 y.o. female with a hx of smoking, HTN,Gold [name] IIICOPD, right breast cancer, hyperlipidemia, PAD, and mesenteric vascular disease who was previously followed by [name] who now returns to clinic for follow-up.  She had a mesenteric angiogram done in 5/12 by Dr. Bridgett [name], showing occluded celiac and SMA arteries with patent IMA. She was planned for aorto-mesenteric bypass. She was referred for cardiology evaluation prior to surgery. However, it was ultimately decided not to treat her mesenteric disease surgically.   Given her exertional chest tightness and shortness of breath as well as known vascular disease, Dr. Aundra [name] set her up for left heart cath. This was done in 5/12 and showed a 70-80% ostial stenosis of a small to moderate 2nd diagonal. This was too small to intervene on and not likely to be particularly symptomatic. Lower extremity arterial dopplers showed a severe focal proximal right SFA stenosis with collaterals from the PFA. She had a VATS for a right-sided lung nodule that showed granulomatous inflammation (no cancer). She quit smokingfor a short time.She saw Dr. Fletcher [name] for PAD evaluation, and it was decided to treat her medically.   Was hospitalized in 05/2020 with worsening DOE.  Echo showed reduced EF. Seen by cardiology and underwent coronary angiography which was negative for any obstructive disease. She was started on Paxil given significant depression.   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 Donzetta [name].    Today, the patient states she is doing really well. No claudication symptoms since stent placement. No chest pain, shortness of breath at rest. Has continued DOE due to her COPD but is stable. No abdominal pain, LE edema, orthopnea or PND. Tolerating medications well. Also was diagnosed with severe eczema and has been using steroid cream. Now planned for injection therapy.     Past Medical History:  Diagnosis Date  . Bilateral leg cramps   . Bladder neoplasm   . Cancer ([place])   . CHF (congestive heart failure) ([place])   . Chronic deep vein thrombosis (DVT) of right lower extremity ([place])    05/ 2018  chronic non-occlusive DVT RLE  . COPD, frequent exacerbations ([place])    03-06-2018 per pt last exacerbation 12/ 2018  . Coronary artery disease    cardiologist-  dr [name]-- 03-11-2018 er cath , 80% stenosis in the ostial second diagonal  . Dyspnea on minimal exertion   . Emphysema/COPD ([place])    CAT score- 17  . Family history of breast cancer   . Family history of colon cancer   . Family history of melanoma   . Family history of ovarian cancer   . Family history of pancreatic cancer   . Fibromyalgia 1995  . GERD (gastroesophageal reflux disease)   . Hiatal hernia   . History of breast cancer   . History of diverticulitis of colon    2002  s/p  sigmoid colectomy  . History of multiple pulmonary nodules    hx RUL nodules x2  s/p right VATS w/ wedge resection 09-11-2005 and 06-14-2011  both  necrotizing granulomatous inflammation w/ cystic area of necrosis & focal calcification  . History of right breast cancer oncologist-  dr [name]-- no recurrence   dx 2010--  IDC, [name] IA , ER/PR+,  (pT1c pN0) 11-09-2008 right lumpectomy;  12-18-2008 right simple mastectomy for DCIS margins;  completed chemotherapy 2010 (no radiation) and completed antiestrogen therapy  . Hx of colonic polyps    [name] -last study '11  . Hyperlipidemia   . Hypertension   . Hypothyroidism   . 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 Nocturia   . OA (osteoarthritis)    thumbs  . On supplemental oxygen therapy    03-06-2018 per pt uses only at night,  checks O2 sats at home,  stated am sat 89% after moving around average 93-94% with RA  . Osteoporosis   . Peripheral arterial occlusive disease [facility]) vascular-- dr chen/ dr [name]   proximal right SFA severe focal stenosis with collaterals from the PFA/  04/ 2012 occluded celiac and SMA arteries with distal reconstitution w/ patent IMA  . Peripheral vascular disease ([place])    chronic DVT RLE,  mesenteric vascular disease  . Right lower lobe pulmonary nodule    Chest CT 08-29-2017  . [name] 3 severe COPD by GOLD classification ([place]) hx frequent exacerbations--   pulmologist-  dr [name]--  per lov note , dated 12-20-2017, oxyogen 2L is prescribed for use with exertion (but pt only uses mostly at night), O2 sats on RA run the 80s , this day sat 86% RA and with 2L O2 sat 92%  . Varicose veins of leg with swelling    varicose vein surgery - Dr. Aleda [name]  . Wears glasses   . Wears hearing aid in both ears     Past Surgical History:  Procedure Laterality Date  . ABDOMINAL AORTOGRAM N/A 07/11/2020   Procedure: ABDOMINAL AORTOGRAM;  Surgeon: Christy Sandy, MD;  Location: [place] [facility];  Service: Cardiovascular;  Laterality: N/A;  . AORTIC ARCH ANGIOGRAPHY N/A 07/11/2020   Procedure: AORTIC ARCH ANGIOGRAPHY;  Surgeon: Christy Sandy, MD;  Location: [place] [facility];  Service: Cardiovascular;  Laterality: N/A;  . BREAST EXCISIONAL BIOPSY Left 10/15/2008  . BREAST LUMPECTOMY W/ NEEDLE LOCALIZATION Right 11-09-2008    dr [name]   [facility]  . CARDIAC CATHETERIZATION  03-12-2011   dr [name]   70-80% ostial stenosis in small second diagonal (appears to small for intervention),  dLAD 40-50%,  minimal luminal irregulartieis involoving LCFx and RCA,  LVEF 55%  . CATARACT EXTRACTION W/ INTRAOCULAR  LENS  IMPLANT, BILATERAL  2011  . CYSTOSCOPY N/A 03/11/2018   Procedure: CYSTOSCOPY WITH INSTILLATION OF POST OPERATIVE EPIRUBICIN;  Surgeon: Festus Aloe, MD;  Location: [facility];  Service: Urology;  Laterality: N/A;  . LEFT HEART CATH AND CORONARY ANGIOGRAPHY N/A 05/25/2020   Procedure: LEFT HEART CATH AND CORONARY ANGIOGRAPHY;  Surgeon: Troy Sine, MD;  Location: [place] [facility];  Service: Cardiovascular;  Laterality: N/A;  . MASTECTOMY Right   . PARTIAL COLECTOMY  2002   sigmoid and appectomy (diverticulitis)  . PARTIAL MASTECTOMY WITH NEEDLE LOCALIZATION Left 02/23/2013   Procedure:  LEFT PARTIAL MASTECTOMY WITH NEEDLE LOCALIZATION;  Surgeon: Adin Hector, MD;  Location: [place] [place];  Service: General;  Laterality: Left;  . PERIPHERAL VASCULAR INTERVENTION  07/11/2020   Procedure: PERIPHERAL VASCULAR INTERVENTION;  Surgeon: Christy Sandy, MD;  Location: [place] [facility];  Service: Cardiovascular;;  . RECONSTRUCTION BREAST W/ LATISSIMUS DORSI FLAP Right 05-30-2009   dr Harlow Mares  [facility]  . REDUCTION MAMMAPLASTY Left   . SIMPLE MASTECTOMY Right 12-28-2008    dr [name]  [facility]  . TRANSTHORACIC ECHOCARDIOGRAM  07-09-2016  dr [name]   ef 55-60%, grade 1 diastoic dysfunction/  mild TR  . TRANSURETHRAL RESECTION OF BLADDER TUMOR N/A 03/11/2018   Procedure: TRANSURETHRAL RESECTION OF BLADDER TUMOR (TURBT) 2-5cm;  Surgeon: Festus Aloe, MD;  Location: [facility];  Service: Urology;  Laterality: N/A;  . UPPER EXTREMITY ANGIOGRAPHY Left 07/11/2020   Procedure: Upper Extremity Angiography;  Surgeon: Christy Sandy, MD;  Location: [place] [facility];  Service: Cardiovascular;  Laterality: Left;  Marland Kitchen VAGINAL HYSTERECTOMY  1973  . VIDEO ASSISTED THORACOSCOPY (VATS)/WEDGE RESECTION Right 09-11-2005  &  06-14-2011   dr Fara Boros  [facility]   both RUL    Current Medications: Current Meds  Medication Sig  . acetaminophen (TYLENOL) 325 MG tablet Take 2 tablets (650  mg total) by mouth every 6 (six) hours as needed for mild pain (or Fever >/= 101).  Marland Kitchen albuterol (VENTOLIN HFA) 108 (90 Base) MCG/ACT inhaler TAKE 2 PUFFS BY MOUTH EVERY 6 HOURS AS NEEDED FOR WHEEZE OR SHORTNESS OF BREATH  . amLODipine (NORVASC) 5 MG tablet 1 tablet  . aspirin EC 81 MG tablet Take 1 tablet (81 mg total) by mouth daily.  Marland Kitchen atorvastatin (LIPITOR) 80 MG tablet TAKE 1 TABLET BY MOUTH EVERY DAY  . benzonatate (TESSALON PERLES) 100 MG capsule Take 1 capsule (100 mg total) by mouth 3 (three) times daily as needed for cough.  . Biotin 10000 MCG TABS Take 10,000 mcg by mouth daily.  . budesonide (PULMICORT) 0.5 MG/2ML nebulizer solution Take 2 mLs (0.5 mg total) by nebulization 2 (two) times daily.  . cilostazol (PLETAL) 100 MG tablet TAKE 1 TABLET BY MOUTH TWICE A DAY  . clopidogrel (PLAVIX) 75 MG tablet Take 1 tablet (75 mg total) by mouth daily.  . diclofenac (VOLTAREN) 75 MG EC tablet 1 tablet  . fluticasone (FLONASE) 50 MCG/ACT nasal spray SPRAY 1 SPRAY INTO EACH NOSTRIL TWICE A DAY  . furosemide (LASIX) 40 MG tablet TAKE 1 TABLET BY MOUTH TWICE A DAY  . hydrOXYzine (ATARAX/VISTARIL) 25 MG tablet Take 25 mg by mouth daily.  Marland Kitchen ipratropium-albuterol (DUONEB) 0.5-2.5 (3) MG/3ML SOLN Inhale 3 mLs into the lungs every 6 (six) hours as needed (sob, wheezing).  Marland Kitchen KLOR-CON M20 20 MEQ tablet TAKE 1 TABLET BY MOUTH 3 TIMES DAILY.  Marland Kitchen levothyroxine (SYNTHROID) 112 MCG tablet TAKE 1 TABLET BY MOUTH EVERY DAY IN THE MORNING BEFORE BREAKFAST  . linaclotide (LINZESS) 145 MCG CAPS capsule 1 capsule at least 30 minutes before the first meal of the day on an empty stomach  . LORazepam (ATIVAN) 2 MG tablet Take 1 tablet (2 mg total) by mouth at bedtime.  . metoprolol tartrate (LOPRESSOR) 25 MG tablet 1 tablet  . montelukast (SINGULAIR) 10 MG tablet Take 1 tablet (10 mg total) by mouth at bedtime.  . pantoprazole (PROTONIX) 40 MG tablet TAKE 1 TABLET BY MOUTH EVERY DAY  . PARoxetine (PAXIL) 20 MG tablet  TAKE 1 TABLET BY MOUTH EVERYDAY AT BEDTIME  . tamsulosin (FLOMAX) 0.4 MG CAPS capsule Take 1 capsule (0.4 mg total) by mouth daily.  . traZODone (DESYREL) 100 MG tablet TAKE 1 TABLET BY MOUTH EVERYDAY AT BEDTIME  . TRELEGY ELLIPTA  200-62.5-25 MCG/INH AEPB TAKE 1 PUFF BY MOUTH EVERY DAY     Allergies:   Sulfa antibiotics   Social History   Socioeconomic History  . Marital status: Married    Spouse name: Not on file  . Number of children: 3  . Years of education: 9  . Highest education level: Not on file  Occupational History  . Occupation: Chiropractor: RETIRED    Comment: retired  Tobacco Use  . Smoking status: Former Smoker    Packs/day: 0.75    Years: 50.00    Pack years: 37.50    Types: Cigarettes    Quit date: 05/18/2016    Years since quitting: 4.6  . Smokeless tobacco: Never Used  Vaping Use  . Vaping Use: Never used  Substance and Sexual Activity  . Alcohol use: Yes    Alcohol/week: 10.0 standard drinks    Types: 10 Cans of beer per week  . Drug use: No  . Sexual activity: Not on file  Other Topics Concern  . Not on file  Social History Narrative   HSG, beauty school. Married '69 -82 yrs/widowed; married '79 - 69yr/divorced; married '87.  2 sons - '70, '74, 1 srtep-son; 1 grandchild. 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 SO - good health.  NO history of abuse.  ACP - full code and all heroic measures.    Social Determinants of Health   Financial Resource Strain: Low Risk   . Difficulty of Paying Living Expenses: Not hard at all  Food Insecurity: No Food Insecurity  . Worried About Charity fundraiser in the Last Year: Never true  . Ran Out of Food in the Last Year: Never true  Transportation Needs: No Transportation Needs  . Lack of Transportation (Medical): No  . Lack of Transportation (Non-Medical): No  Physical Activity: Inactive  . Days of Exercise per Week: 0 days  . Minutes of Exercise per Session: 0 min  Stress: No Stress Concern Present  .  Feeling of Stress : Not at all  Social Connections: Socially Isolated  . Frequency of Communication with Friends and Family: Once a week  . Frequency of Social Gatherings with Friends and Family: Never  . Attends Religious Services: Never  . Active Member of Clubs or Organizations: No  . Attends Archivist Meetings: Never  . Marital Status: Married     Family History: The patient's family history includes Breast cancer (age of onset: 29) in her niece; COPD in her mother; Cancer in her sister; Colon cancer in her mother; Coronary artery disease in her brother and father; Diabetes in her mother; Emphysema in her mother; Heart attack in her brother and father; Heart disease in her father; Hypertension in her mother, sister, and sister; Melanoma in her niece and sister; Ovarian cancer in her sister; Pancreatic cancer in her sister; Sudden death in her father; Throat cancer in her sister.  ROS:   Please see the history of present illness.    Review of Systems  Constitutional: Negative for chills and fever.  HENT: Negative for hearing loss.   Eyes: Negative for blurred vision and redness.  Respiratory: Positive for shortness of breath.   Cardiovascular: Negative for chest pain, palpitations, orthopnea, claudication, leg swelling and PND.  Gastrointestinal: Negative for melena, nausea and vomiting.  Genitourinary: Negative for dysuria and flank pain.  Musculoskeletal: Positive for myalgias.  Skin: Positive for itching and rash.  Neurological: Negative for dizziness and loss of consciousness.  Endo/Heme/Allergies: Negative for polydipsia.  Psychiatric/Behavioral: Negative for substance abuse.    EKGs/Labs/Other Studies Reviewed:    The following studies were reviewed today: 07/11/2020 Pre-operative Diagnosis:Left subclavian artery steal Post-operative diagnosis:Same Surgeon:Brandon C. Christy Matters, MD Procedure Performed: 1. Ultrasound-guided cannulation right common femoral  artery 2. Arch aortogram 3. Stent of left subclavian artery with 8 x 29 mm VBX 4. Abdominal aortogram 5. Moderate sedation with fentanyl Versed for 24 minutes  Indications:75 year old female with known history of mesenteric artery stenoses. This has never been intervened upon. She more recently was found to have a subtotally occluded left subclavian artery with retrograde flow in her vertebral artery which was noted to be symptomatic. She is now indicated for angiography possible invention.  Findings: Left subclavian artery was heavily calcified she had retrograde flow in her left vertebral artery. She appears to have a bovine arch these arteries are all patent. After intervention of the left subclavian artery there is antegrade flow in the left vertebral artery 0% residual stenosis were previously was occluded.  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 Bilateral renal arteries without stenosis.     LEFT HEART CATH AND CORONARY ANGIOGRAPHY8/2021  Conclusion  No significant coronary obstructive disease. The left main is short and immediately trifurcates into a large LAD, ramus immediate and left circumflex vessel. The midportion of the LAD dips intramyocardially slightly but there is no evidence for systolic bridging. The RCA is a dominant vessel that has calcification without obstructive stenosis.  Hyperdynamic LV function with EF estimated at 65%. LVEDP 10 mmHg.  RECOMMENDATION: Medical therapy.   Echo Impression 05/21/20  1. Left ventricular ejection fraction, by estimation, is 45 to 50%. The  left ventricle has mildly decreased function. The left ventricle  demonstrates global hypokinesis. Indeterminate diastolic filling due to  E-A fusion. Definity contrast administered  but images remain suboptimal to exclude regional wall motion  abnormalities.  2. Right ventricular systolic function is normal. The right ventricular   size is normal. Tricuspid regurgitation signal is inadequate for assessing  PA pressure.  3. Left atrial size was mildly dilated.  4. The mitral valve is degenerative. Trivial mitral valve regurgitation.  Mild mitral stenosis. The mean mitral valve gradient is 3.0 mmHg with  average heart rate of 85 bpm.  5. The aortic valve is grossly normal. Aortic valve regurgitation is not  visualized. No aortic stenosis is present.  6. The inferior vena cava is dilated in size with >50% respiratory  variability, suggesting right atrial pressure of 8 mmHg.     EKG:  EKG is  ordered today.  The ekg ordered today demonstrates NSR with HR 73  Recent Labs: 05/21/2020: TSH 0.433 09/10/2020: B Natriuretic Peptide 145.4 09/14/2020: Magnesium 2.2 11/18/2020: ALT 18; BUN 11; Creatinine 1.12; Hemoglobin 13.4; Platelet Count 280; Potassium 3.6; Sodium 135  Recent Lipid Panel    Component Value Date/Time   CHOL 147 06/04/2018 1200   TRIG 73 06/04/2018 1200   TRIG 70 08/28/2006 0847   HDL 83 06/04/2018 1200   CHOLHDL 1.8 06/04/2018 1200   CHOLHDL 2.4 09/03/2016 1527   VLDL 49 (H) 09/03/2016 1527   LDLCALC 49 06/04/2018 1200   LDLDIRECT 88.0 08/18/2013 1001     Physical Exam:    VS:  BP 120/70   Pulse 73   Ht 5\' 6"  (1.676 m)   Wt 130 lb 3.2 oz (59.1 kg)   SpO2 95%   BMI 21.01 kg/m     Wt  Readings from Last 3 Encounters:  12/29/20 130 lb 3.2 oz (59.1 kg)  11/25/20 132 lb 1.6 oz (59.9 kg)  10/05/20 134 lb 6.4 oz (61 kg)     GEN:  Thin, elderly female, comfortable HEENT: Normal NECK: No JVD; No carotid bruits CARDIAC: RRR, no murmurs, rubs, gallops RESPIRATORY: Diminished but clear with no wheezes ABDOMEN: Soft, non-tender, non-distended MUSCULOSKELETAL:  No edema; No deformity  SKIN: Diffuse erythematous raised rash on both upper and lower extremities NEUROLOGIC:  Alert and oriented x 3 PSYCHIATRIC:  Normal affect   ASSESSMENT:    1. Chronic systolic heart failure ([place])   2. PAD  (peripheral artery disease) ([place])   3. Tobacco abuse    PLAN:    In order of problems listed above:  #Heart Failure with Mildly Reduced LVEF 45-50%: Recently diagnosed on admission for DOE in 05/2020. Cath negative for obstructive disease. Euvolemic. Unable to tolerate GDMT due to dizziness. Feels well with no anginal or HF symptoms -Continue low dose metoprolol  #Severe PAD: Followed by Dr. Donzetta [name]. Recent left subclavian stenting. -Continue ASA 81, plavix 75 -Continue atorvastatin 80mg  daily -Continue cilostizol -Follow-up with Dr. Donzetta [name]  #Severe COPD: Has had multiple hospitalizations for acute exacerbations. -Continue follow-up with pulm -Continue home inhalers  #HLD: -Lipitor 80mg  daily  #Prior smoker: -Has abstained from smoking; doing well  #Mild carotid artery stenosis: 1-39% bilaterally on carotid in 06/2020. -Continue asa, plavix and statin      Medication Adjustments/Labs and Tests Ordered: Current medicines are reviewed at length with the patient today.  Concerns regarding medicines are outlined above.  Orders Placed This Encounter  Procedures  . EKG 12-Lead   No orders of the defined types were placed in this encounter.   Patient Instructions  Medication Instructions:  Your physician recommends that you continue on your current medications as directed. Please refer to the Current Medication list given to you today. *If you need a refill on your cardiac medications before your next appointment, please call your pharmacy*   Follow-Up: At [facility], you and your health needs are our priority.  As part of our continuing mission to provide you with exceptional heart care, we have created designated Provider Care Teams.  These Care Teams include your primary Cardiologist (physician) and Advanced Practice Providers (APPs -  Physician Assistants and Nurse Practitioners) who all work together to provide you with the care you need, when you need it.  We  recommend signing up for the patient portal called "MyChart".  Sign up information is provided on this After Visit Summary.  MyChart is used to connect with patients for Virtual Visits (Telemedicine).  Patients are able to view lab/test results, encounter notes, upcoming appointments, etc.  Non-urgent messages can be sent to your provider as well.   To learn more about what you can do with MyChart, go to [email or web address].    Your next appointment:   3 month(s)  The format for your next appointment:   In Person  Provider:   You may see Dr. Gwyndolyn Kaufman or one of the following Advanced Practice Providers on your designated Care Team:    Richardson Dopp, PA-C  Robbie Lis, [place]         Signed, Freada Bergeron, MD  12/29/2020 5:27 PM    [place]

## 2020-12-29 ENCOUNTER — Encounter: Payer: Self-pay | Admitting: Cardiology

## 2020-12-29 ENCOUNTER — Other Ambulatory Visit: Payer: Self-pay

## 2020-12-29 ENCOUNTER — Ambulatory Visit: Payer: Medicare Other | Admitting: Cardiology

## 2020-12-29 VITALS — BP 120/70 | HR 73 | Ht 66.0 in | Wt 130.2 lb

## 2020-12-29 DIAGNOSIS — I739 Peripheral vascular disease, unspecified: Secondary | ICD-10-CM

## 2020-12-29 DIAGNOSIS — K559 Vascular disorder of intestine, unspecified: Secondary | ICD-10-CM

## 2020-12-29 DIAGNOSIS — I5022 Chronic systolic (congestive) heart failure: Secondary | ICD-10-CM

## 2020-12-29 DIAGNOSIS — Z720 Tobacco use: Secondary | ICD-10-CM | POA: Diagnosis not present

## 2020-12-29 DIAGNOSIS — I6522 Occlusion and stenosis of left carotid artery: Secondary | ICD-10-CM

## 2020-12-29 NOTE — Patient Instructions (Signed)
Medication Instructions:  Your physician recommends that you continue on your current medications as directed. Please refer to the Current Medication list given to you today. *If you need a refill on your cardiac medications before your next appointment, please call your pharmacy*   Follow-Up: At [facility] you and your health needs are our priority.  As part of our continuing mission to provide you with exceptional heart care, we have created designated Provider Care Teams.  These Care Teams include your primary Cardiologist (physician) and Advanced Practice Providers (APPs -  Physician Assistants and Nurse Practitioners) who all work together to provide you with the care you need, when you need it.  We recommend signing up for the patient portal called "MyChart".  Sign up information is provided on this After Visit Summary.  MyChart is used to connect with patients for Virtual Visits (Telemedicine).  Patients are able to view lab/test results, encounter notes, upcoming appointments, etc.  Non-urgent messages can be sent to your provider as well.   To learn more about what you can do with MyChart, go to [email or web address].    Your next appointment:   3 month(s)  The format for your next appointment:   In Person  Provider:   You may see Dr. Gwyndolyn Kaufman or one of the following Advanced Practice Providers on your designated Care Team:    Richardson Dopp, PA-C  [place], [place]

## 2020-12-31 ENCOUNTER — Other Ambulatory Visit: Payer: Self-pay | Admitting: Family Medicine

## 2021-01-07 ENCOUNTER — Other Ambulatory Visit: Payer: Self-pay | Admitting: Family Medicine

## 2021-01-07 DIAGNOSIS — G4700 Insomnia, unspecified: Secondary | ICD-10-CM

## 2021-01-07 DIAGNOSIS — F419 Anxiety disorder, unspecified: Secondary | ICD-10-CM

## 2021-01-09 ENCOUNTER — Ambulatory Visit: Payer: Medicare Other | Admitting: Family Medicine

## 2021-01-09 NOTE — Telephone Encounter (Signed)
Last filled: 12/07/20 Last OV: 09/26/20 Next OV: 01/13/21

## 2021-01-10 ENCOUNTER — Other Ambulatory Visit: Payer: Self-pay | Admitting: Family Medicine

## 2021-01-11 NOTE — Telephone Encounter (Signed)
  LORazepam (ATIVAN) 2 MG tablet   [id number] [place], [place] - [place] Phone:  [phone]  Fax:  [phone]     The patient is out of her medication and needs this medication to sleep. She can't sleep without it.

## 2021-01-13 ENCOUNTER — Ambulatory Visit: Payer: Medicare Other | Admitting: Family Medicine

## 2021-01-13 DIAGNOSIS — Z0289 Encounter for other administrative examinations: Secondary | ICD-10-CM

## 2021-02-01 ENCOUNTER — Other Ambulatory Visit: Payer: Self-pay | Admitting: Family Medicine

## 2021-02-08 ENCOUNTER — Other Ambulatory Visit: Payer: Self-pay | Admitting: Family Medicine

## 2021-02-18 ENCOUNTER — Other Ambulatory Visit: Payer: Self-pay | Admitting: Family Medicine

## 2021-02-27 ENCOUNTER — Other Ambulatory Visit: Payer: Self-pay | Admitting: Family Medicine

## 2021-03-09 ENCOUNTER — Emergency Department (HOSPITAL_COMMUNITY): Payer: Medicare Other

## 2021-03-09 ENCOUNTER — Inpatient Hospital Stay (HOSPITAL_COMMUNITY)
Admission: EM | Admit: 2021-03-09 | Discharge: 2021-03-12 | DRG: 189 | Disposition: A | Discharge status: Home / Self Care | Payer: Medicare Other | Attending: Family Medicine | Admitting: Family Medicine

## 2021-03-09 ENCOUNTER — Encounter (HOSPITAL_COMMUNITY): Payer: Self-pay | Admitting: Emergency Medicine

## 2021-03-09 ENCOUNTER — Other Ambulatory Visit: Payer: Self-pay

## 2021-03-09 DIAGNOSIS — K551 Chronic vascular disorders of intestine: Secondary | ICD-10-CM | POA: Diagnosis present

## 2021-03-09 DIAGNOSIS — J439 Emphysema, unspecified: Secondary | ICD-10-CM | POA: Diagnosis present

## 2021-03-09 DIAGNOSIS — Z9011 Acquired absence of right breast and nipple: Secondary | ICD-10-CM | POA: Diagnosis not present

## 2021-03-09 DIAGNOSIS — J9621 Acute and chronic respiratory failure with hypoxia: Secondary | ICD-10-CM | POA: Diagnosis not present

## 2021-03-09 DIAGNOSIS — M797 Fibromyalgia: Secondary | ICD-10-CM | POA: Diagnosis present

## 2021-03-09 DIAGNOSIS — Z800 Family history of malignant neoplasm of digestive organs: Secondary | ICD-10-CM

## 2021-03-09 DIAGNOSIS — Z7982 Long term (current) use of aspirin: Secondary | ICD-10-CM

## 2021-03-09 DIAGNOSIS — Z803 Family history of malignant neoplasm of breast: Secondary | ICD-10-CM

## 2021-03-09 DIAGNOSIS — Z8049 Family history of malignant neoplasm of other genital organs: Secondary | ICD-10-CM

## 2021-03-09 DIAGNOSIS — Z66 Do not resuscitate: Secondary | ICD-10-CM | POA: Diagnosis present

## 2021-03-09 DIAGNOSIS — I110 Hypertensive heart disease with heart failure: Secondary | ICD-10-CM | POA: Diagnosis present

## 2021-03-09 DIAGNOSIS — Z9049 Acquired absence of other specified parts of digestive tract: Secondary | ICD-10-CM

## 2021-03-09 DIAGNOSIS — Z87891 Personal history of nicotine dependence: Secondary | ICD-10-CM | POA: Diagnosis not present

## 2021-03-09 DIAGNOSIS — Z20822 Contact with and (suspected) exposure to covid-19: Secondary | ICD-10-CM | POA: Diagnosis present

## 2021-03-09 DIAGNOSIS — I5020 Unspecified systolic (congestive) heart failure: Secondary | ICD-10-CM | POA: Diagnosis present

## 2021-03-09 DIAGNOSIS — M810 Age-related osteoporosis without current pathological fracture: Secondary | ICD-10-CM | POA: Diagnosis present

## 2021-03-09 DIAGNOSIS — I10 Essential (primary) hypertension: Secondary | ICD-10-CM | POA: Diagnosis not present

## 2021-03-09 DIAGNOSIS — Z79890 Hormone replacement therapy (postmenopausal): Secondary | ICD-10-CM

## 2021-03-09 DIAGNOSIS — I2510 Atherosclerotic heart disease of native coronary artery without angina pectoris: Secondary | ICD-10-CM | POA: Diagnosis present

## 2021-03-09 DIAGNOSIS — I708 Atherosclerosis of other arteries: Secondary | ICD-10-CM | POA: Diagnosis present

## 2021-03-09 DIAGNOSIS — I5042 Chronic combined systolic (congestive) and diastolic (congestive) heart failure: Secondary | ICD-10-CM | POA: Diagnosis present

## 2021-03-09 DIAGNOSIS — Z853 Personal history of malignant neoplasm of breast: Secondary | ICD-10-CM

## 2021-03-09 DIAGNOSIS — E785 Hyperlipidemia, unspecified: Secondary | ICD-10-CM | POA: Diagnosis present

## 2021-03-09 DIAGNOSIS — E039 Hypothyroidism, unspecified: Secondary | ICD-10-CM | POA: Diagnosis present

## 2021-03-09 DIAGNOSIS — I739 Peripheral vascular disease, unspecified: Secondary | ICD-10-CM | POA: Diagnosis present

## 2021-03-09 DIAGNOSIS — Z8249 Family history of ischemic heart disease and other diseases of the circulatory system: Secondary | ICD-10-CM

## 2021-03-09 DIAGNOSIS — I82501 Chronic embolism and thrombosis of unspecified deep veins of right lower extremity: Secondary | ICD-10-CM | POA: Diagnosis present

## 2021-03-09 DIAGNOSIS — Z833 Family history of diabetes mellitus: Secondary | ICD-10-CM | POA: Diagnosis not present

## 2021-03-09 DIAGNOSIS — I5032 Chronic diastolic (congestive) heart failure: Secondary | ICD-10-CM | POA: Diagnosis present

## 2021-03-09 DIAGNOSIS — R0603 Acute respiratory distress: Secondary | ICD-10-CM | POA: Diagnosis present

## 2021-03-09 DIAGNOSIS — J441 Chronic obstructive pulmonary disease with (acute) exacerbation: Secondary | ICD-10-CM | POA: Diagnosis present

## 2021-03-09 DIAGNOSIS — Z7902 Long term (current) use of antithrombotics/antiplatelets: Secondary | ICD-10-CM

## 2021-03-09 DIAGNOSIS — Z825 Family history of asthma and other chronic lower respiratory diseases: Secondary | ICD-10-CM

## 2021-03-09 DIAGNOSIS — Z79899 Other long term (current) drug therapy: Secondary | ICD-10-CM

## 2021-03-09 DIAGNOSIS — I25119 Atherosclerotic heart disease of native coronary artery with unspecified angina pectoris: Secondary | ICD-10-CM | POA: Diagnosis not present

## 2021-03-09 DIAGNOSIS — R7303 Prediabetes: Secondary | ICD-10-CM | POA: Diagnosis present

## 2021-03-09 DIAGNOSIS — Z974 Presence of external hearing-aid: Secondary | ICD-10-CM | POA: Diagnosis not present

## 2021-03-09 DIAGNOSIS — Z7952 Long term (current) use of systemic steroids: Secondary | ICD-10-CM

## 2021-03-09 DIAGNOSIS — R739 Hyperglycemia, unspecified: Secondary | ICD-10-CM | POA: Diagnosis present

## 2021-03-09 DIAGNOSIS — E7800 Pure hypercholesterolemia, unspecified: Secondary | ICD-10-CM | POA: Diagnosis not present

## 2021-03-09 DIAGNOSIS — I6529 Occlusion and stenosis of unspecified carotid artery: Secondary | ICD-10-CM | POA: Diagnosis present

## 2021-03-09 DIAGNOSIS — T380X5A Adverse effect of glucocorticoids and synthetic analogues, initial encounter: Secondary | ICD-10-CM | POA: Diagnosis present

## 2021-03-09 DIAGNOSIS — K219 Gastro-esophageal reflux disease without esophagitis: Secondary | ICD-10-CM | POA: Diagnosis present

## 2021-03-09 DIAGNOSIS — Z808 Family history of malignant neoplasm of other organs or systems: Secondary | ICD-10-CM

## 2021-03-09 DIAGNOSIS — Z8041 Family history of malignant neoplasm of ovary: Secondary | ICD-10-CM

## 2021-03-09 LAB — CBC WITH DIFFERENTIAL/PLATELET
Abs Immature Granulocytes: 0.1 10*3/uL — ABNORMAL HIGH (ref 0.00–0.07)
Basophils Absolute: 0.1 10*3/uL (ref 0.0–0.1)
Basophils Relative: 0 %
Eosinophils Absolute: 0.2 10*3/uL (ref 0.0–0.5)
Eosinophils Relative: 1 %
HCT: 37.3 % (ref 36.0–46.0)
Hemoglobin: 12.1 g/dL (ref 12.0–15.0)
Immature Granulocytes: 1 %
Lymphocytes Relative: 10 %
Lymphs Abs: 1.5 10*3/uL (ref 0.7–4.0)
MCH: 28.5 pg (ref 26.0–34.0)
MCHC: 32.4 g/dL (ref 30.0–36.0)
MCV: 87.8 fL (ref 80.0–100.0)
Monocytes Absolute: 1.7 10*3/uL — ABNORMAL HIGH (ref 0.1–1.0)
Monocytes Relative: 12 %
Neutro Abs: 11.1 10*3/uL — ABNORMAL HIGH (ref 1.7–7.7)
Neutrophils Relative %: 76 %
Platelets: 619 10*3/uL — ABNORMAL HIGH (ref 150–400)
RBC: 4.25 MIL/uL (ref 3.87–5.11)
RDW: 13.2 % (ref 11.5–15.5)
WBC: 14.7 10*3/uL — ABNORMAL HIGH (ref 4.0–10.5)
nRBC: 0 % (ref 0.0–0.2)

## 2021-03-09 LAB — COMPREHENSIVE METABOLIC PANEL
ALT: 28 U/L (ref 0–44)
AST: 29 U/L (ref 15–41)
Albumin: 3 g/dL — ABNORMAL LOW (ref 3.5–5.0)
Alkaline Phosphatase: 112 U/L (ref 38–126)
Anion gap: 8 (ref 5–15)
BUN: 7 mg/dL — ABNORMAL LOW (ref 8–23)
CO2: 25 mmol/L (ref 22–32)
Calcium: 8.3 mg/dL — ABNORMAL LOW (ref 8.9–10.3)
Chloride: 102 mmol/L (ref 98–111)
Creatinine, Ser: 0.79 mg/dL (ref 0.44–1.00)
GFR, Estimated: >60 mL/min (ref >60)
Glucose, Bld: 111 mg/dL — ABNORMAL HIGH (ref 70–99)
Potassium: 3.5 mmol/L (ref 3.5–5.1)
Sodium: 135 mmol/L (ref 135–145)
Total Bilirubin: 0.9 mg/dL (ref 0.3–1.2)
Total Protein: 7.5 g/dL (ref 6.5–8.1)

## 2021-03-09 LAB — RESP PANEL BY RT-PCR (FLU A&B, COVID) ARPGX2
Influenza A by PCR: NEGATIVE
Influenza B by PCR: NEGATIVE
SARS Coronavirus 2 by RT PCR: NEGATIVE

## 2021-03-09 LAB — TROPONIN I (HIGH SENSITIVITY)
Troponin I (High Sensitivity): 18 ng/L — ABNORMAL HIGH (ref <18)
Troponin I (High Sensitivity): 27 ng/L — ABNORMAL HIGH (ref <18)

## 2021-03-09 LAB — BRAIN NATRIURETIC PEPTIDE: B Natriuretic Peptide: 58.7 pg/mL (ref 0.0–100.0)

## 2021-03-09 MED ORDER — PANTOPRAZOLE SODIUM 40 MG PO TBEC
40.0000 mg | DELAYED_RELEASE_TABLET | Freq: Every day | ORAL | Status: DC
  Administered 2021-03-09 – 2021-03-12 (×4): 40 mg via ORAL
  Filled 2021-03-09 (×5): qty 1

## 2021-03-09 MED ORDER — TAMSULOSIN HCL 0.4 MG PO CAPS
0.4000 mg | ORAL_CAPSULE | Freq: Every day | ORAL | Status: DC
  Administered 2021-03-09 – 2021-03-12 (×4): 0.4 mg via ORAL
  Filled 2021-03-09 (×4): qty 1

## 2021-03-09 MED ORDER — AMITRIPTYLINE HCL 50 MG PO TABS
50.0000 mg | ORAL_TABLET | Freq: Every day | ORAL | Status: DC
  Administered 2021-03-10 – 2021-03-12 (×3): 50 mg via ORAL
  Filled 2021-03-09 (×3): qty 1

## 2021-03-09 MED ORDER — MONTELUKAST SODIUM 10 MG PO TABS
10.0000 mg | ORAL_TABLET | Freq: Every day | ORAL | Status: DC
Start: 2021-03-09 — End: 2021-03-12
  Administered 2021-03-09 – 2021-03-11 (×3): 10 mg via ORAL
  Filled 2021-03-09 (×3): qty 1

## 2021-03-09 MED ORDER — UMECLIDINIUM-VILANTEROL 62.5-25 MCG/INH IN AEPB
1.0000 | INHALATION_SPRAY | Freq: Every day | RESPIRATORY_TRACT | Status: DC
  Administered 2021-03-12: 1 via RESPIRATORY_TRACT
  Filled 2021-03-09: qty 14

## 2021-03-09 MED ORDER — ALBUTEROL SULFATE (2.5 MG/3ML) 0.083% IN NEBU: 2.5000 mg | INHALATION_SOLUTION | RESPIRATORY_TRACT | Status: DC | PRN

## 2021-03-09 MED ORDER — CLOPIDOGREL BISULFATE 75 MG PO TABS
75.0000 mg | ORAL_TABLET | Freq: Every day | ORAL | Status: DC
  Administered 2021-03-10 – 2021-03-12 (×3): 75 mg via ORAL
  Filled 2021-03-09 (×3): qty 1

## 2021-03-09 MED ORDER — CILOSTAZOL 100 MG PO TABS
100.0000 mg | ORAL_TABLET | Freq: Two times a day (BID) | ORAL | Status: DC
  Administered 2021-03-09 – 2021-03-12 (×6): 100 mg via ORAL
  Filled 2021-03-09 (×6): qty 1

## 2021-03-09 MED ORDER — PAROXETINE HCL 20 MG PO TABS
20.0000 mg | ORAL_TABLET | Freq: Every day | ORAL | Status: DC
  Administered 2021-03-09 – 2021-03-12 (×4): 20 mg via ORAL
  Filled 2021-03-09 (×6): qty 1

## 2021-03-09 MED ORDER — POTASSIUM CHLORIDE CRYS ER 20 MEQ PO TBCR
20.0000 meq | EXTENDED_RELEASE_TABLET | Freq: Three times a day (TID) | ORAL | Status: DC
  Administered 2021-03-09 – 2021-03-12 (×8): 20 meq via ORAL
  Filled 2021-03-09 (×8): qty 1

## 2021-03-09 MED ORDER — LEVOTHYROXINE SODIUM 112 MCG PO TABS
112.0000 ug | ORAL_TABLET | Freq: Every day | ORAL | Status: DC
  Administered 2021-03-10 – 2021-03-12 (×3): 112 ug via ORAL
  Filled 2021-03-09 (×4): qty 1

## 2021-03-09 MED ORDER — IPRATROPIUM-ALBUTEROL 0.5-2.5 (3) MG/3ML IN SOLN
3.0000 mL | Freq: Four times a day (QID) | RESPIRATORY_TRACT | Status: DC
  Administered 2021-03-10 (×2): 3 mL via RESPIRATORY_TRACT
  Filled 2021-03-09 (×3): qty 3

## 2021-03-09 MED ORDER — BENZONATATE 100 MG PO CAPS
100.0000 mg | ORAL_CAPSULE | Freq: Three times a day (TID) | ORAL | Status: DC | PRN
  Administered 2021-03-09 – 2021-03-12 (×3): 100 mg via ORAL
  Filled 2021-03-09 (×3): qty 1

## 2021-03-09 MED ORDER — TRAZODONE HCL 50 MG PO TABS
100.0000 mg | ORAL_TABLET | Freq: Every day | ORAL | Status: DC
  Administered 2021-03-09 – 2021-03-11 (×3): 100 mg via ORAL
  Filled 2021-03-09 (×3): qty 2

## 2021-03-09 MED ORDER — PREDNISONE 20 MG PO TABS: 40.0000 mg | ORAL_TABLET | Freq: Every day | ORAL | Status: DC

## 2021-03-09 MED ORDER — ENOXAPARIN SODIUM 40 MG/0.4ML IJ SOSY
40.0000 mg | PREFILLED_SYRINGE | Freq: Every day | INTRAMUSCULAR | Status: DC
  Administered 2021-03-09 – 2021-03-11 (×3): 40 mg via SUBCUTANEOUS
  Filled 2021-03-09 (×3): qty 0.4

## 2021-03-09 MED ORDER — METHYLPREDNISOLONE SODIUM SUCC 125 MG IJ SOLR
80.0000 mg | Freq: Four times a day (QID) | INTRAMUSCULAR | Status: DC
  Administered 2021-03-09 – 2021-03-10 (×4): 80 mg via INTRAVENOUS
  Filled 2021-03-09 (×4): qty 2

## 2021-03-09 MED ORDER — BIOTIN 10000 MCG PO TABS: 10000.0000 ug | ORAL_TABLET | Freq: Every day | ORAL | Status: DC

## 2021-03-09 MED ORDER — LEVOFLOXACIN IN D5W 750 MG/150ML IV SOLN
750.0000 mg | INTRAVENOUS | Status: DC
  Administered 2021-03-09 – 2021-03-10 (×2): 750 mg via INTRAVENOUS
  Filled 2021-03-09 (×2): qty 150

## 2021-03-09 MED ORDER — LORAZEPAM 1 MG PO TABS
2.0000 mg | ORAL_TABLET | Freq: Every day | ORAL | Status: DC
  Administered 2021-03-09 – 2021-03-11 (×3): 2 mg via ORAL
  Filled 2021-03-09 (×4): qty 2

## 2021-03-09 MED ORDER — ATORVASTATIN CALCIUM 80 MG PO TABS
80.0000 mg | ORAL_TABLET | Freq: Every day | ORAL | Status: DC
  Administered 2021-03-10 – 2021-03-12 (×3): 80 mg via ORAL
  Filled 2021-03-09 (×4): qty 1

## 2021-03-09 MED ORDER — DUPILUMAB 300 MG/2ML ~~LOC~~ SOAJ: 2.0000 mL | SUBCUTANEOUS | Status: DC

## 2021-03-09 MED ORDER — ASPIRIN EC 81 MG PO TBEC
81.0000 mg | DELAYED_RELEASE_TABLET | Freq: Every day | ORAL | Status: DC
  Administered 2021-03-10 – 2021-03-12 (×3): 81 mg via ORAL
  Filled 2021-03-09 (×3): qty 1

## 2021-03-09 MED ORDER — FUROSEMIDE 40 MG PO TABS
40.0000 mg | ORAL_TABLET | Freq: Two times a day (BID) | ORAL | Status: DC
  Administered 2021-03-10 – 2021-03-11 (×3): 40 mg via ORAL
  Filled 2021-03-09 (×3): qty 1

## 2021-03-09 NOTE — H&P (Addendum)
History and Physical    [name] BLT:[id number] DOB: 03/03/1946 DOA: 03/09/2021  PCP: Martinique, [name], MD  Chief Complaint: Shortness of breath  HPI: [name] is a 75 y.o. 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  Patient presents to the emergency department due to shortness of breath for the past 2 to 3 weeks.  Associated symptoms include purulent cough that is clear.  She endorses wheezing as well.  She has had to increase her oxygen supplementation to 4 L at home.  She contacted her primary care physician and he recommended that she call 911 today.  When EMS arrived she was saturating 80% at home.  She was placed on CPAP en route.  She had been given 10 of albuterol, 0.5 Atrovent, 125 mg of Solu-Medrol and 2 g of magnesium.  In the emergency department she was placed on BiPAP but has now improved and no longer on BiPAP.  She is currently on 4 L and saturating well.  States she feels better.  Still having wheezing but improved. No longer tachypneic upon my evaluation.  Asking if she can eat.  She is not encephalopathic.  She has had multiple hospitalizations for COPD exacerbations.  Last COPD exacerbation was in December 2021.  She has never had to be intubated for her COPD.  Husband present at bedside upon my evaluation. Her pulmonologist is Dr. Chase Caller.  Review of Systems: As per HPI; otherwise review of systems reviewed and negative.    Past Medical History:  Diagnosis Date  . Bilateral leg cramps   . Bladder neoplasm   . Cancer ([place])   . CHF (congestive heart failure)  ([place])   . Chronic deep vein thrombosis (DVT) of right lower extremity ([place])    05/ 2018  chronic non-occlusive DVT RLE  . COPD, frequent exacerbations ([place])    03-06-2018 per pt last exacerbation 12/ 2018  . Coronary artery disease    cardiologist-  dr Aundra Dubin-- 03-11-2018 er cath , 80% stenosis in the ostial second diagonal  . Dyspnea on minimal exertion   . Emphysema/COPD ([place])    CAT score- 17  . Family history of breast cancer   . Family history of colon cancer   . Family history of melanoma   . Family history of ovarian cancer   . Family history of pancreatic cancer   . Fibromyalgia 1995  . GERD (gastroesophageal reflux disease)   . Hiatal hernia   . History of breast cancer   . History of diverticulitis of colon    2002  s/p  sigmoid colectomy  . History of multiple pulmonary nodules    hx RUL nodules x2  s/p right VATS w/ wedge resection 09-11-2005 and 06-14-2011  both  necrotizing granulomatous inflammation w/ cystic area of necrosis & focal calcification  . History of right breast cancer oncologist-  dr Jana Hakim-- no recurrence   dx 2010--  IDC, Stage IA , ER/PR+,  (pT1c pN0) 11-09-2008 right lumpectomy;  12-18-2008 right simple mastectomy for DCIS margins;  completed chemotherapy 2010 (no radiation) and completed antiestrogen therapy  . Hx of colonic polyps    Dr. Cristina Gong -  last study '11  . Hyperlipidemia   . Hypertension   . Hypothyroidism   . 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 Nocturia   . OA (osteoarthritis)    thumbs  . On supplemental oxygen therapy    03-06-2018 per pt uses only at night,  checks O2 sats at home,  stated am sat 89% after moving around average 93-94% with RA  . Osteoporosis   . Peripheral arterial occlusive disease [facility]) vascular-- dr chen/ dr Donzetta Matters   proximal right SFA severe focal stenosis with collaterals from the PFA/  04/ 2012 occluded celiac and SMA arteries with distal reconstitution w/ patent IMA  .  Peripheral vascular disease ([place])    chronic DVT RLE,  mesenteric vascular disease  . Right lower lobe pulmonary nodule    Chest CT 08-29-2017  . Stage 3 severe COPD by GOLD classification ([place]) hx frequent exacerbations--   pulmologist-  dr Maryann Alar--  per lov note , dated 12-20-2017, oxyogen 2L is prescribed for use with exertion (but pt only uses mostly at night), O2 sats on RA run the 80s , this day sat 86% RA and with 2L O2 sat 92%  . Varicose veins of leg with swelling    varicose vein surgery - Dr. Aleda Grana  . Wears glasses   . Wears hearing aid in both ears     Past Surgical History:  Procedure Laterality Date  . ABDOMINAL AORTOGRAM N/A 07/11/2020   Procedure: ABDOMINAL AORTOGRAM;  Surgeon: Waynetta Sandy, MD;  Location: [place] [facility];  Service: Cardiovascular;  Laterality: N/A;  . AORTIC ARCH ANGIOGRAPHY N/A 07/11/2020   Procedure: AORTIC ARCH ANGIOGRAPHY;  Surgeon: Waynetta Sandy, MD;  Location: [place] [facility];  Service: Cardiovascular;  Laterality: N/A;  . BREAST EXCISIONAL BIOPSY Left 10/15/2008  . BREAST LUMPECTOMY W/ NEEDLE LOCALIZATION Right 11-09-2008    dr Brantley Stage   [facility]  . CARDIAC CATHETERIZATION  03-12-2011   dr Aundra Dubin   70-80% ostial stenosis in small second diagonal (appears to small for intervention),  dLAD 40-50%,  minimal luminal irregulartieis involoving LCFx and RCA,  LVEF 55%  . CATARACT EXTRACTION W/ INTRAOCULAR LENS  IMPLANT, BILATERAL  2011  . CYSTOSCOPY N/A 03/11/2018   Procedure: CYSTOSCOPY WITH INSTILLATION OF POST OPERATIVE EPIRUBICIN;  Surgeon: Festus Aloe, MD;  Location: [facility];  Service: Urology;  Laterality: N/A;  . LEFT HEART CATH AND CORONARY ANGIOGRAPHY N/A 05/25/2020   Procedure: LEFT HEART CATH AND CORONARY ANGIOGRAPHY;  Surgeon: Troy Sine, MD;  Location: [place] [facility];  Service: Cardiovascular;  Laterality: N/A;  . MASTECTOMY Right   . PARTIAL COLECTOMY  2002   sigmoid and appectomy  (diverticulitis)  . PARTIAL MASTECTOMY WITH NEEDLE LOCALIZATION Left 02/23/2013   Procedure:  LEFT PARTIAL MASTECTOMY WITH NEEDLE LOCALIZATION;  Surgeon: Adin Hector, MD;  Location: [place];  Service: General;  Laterality: Left;  . PERIPHERAL VASCULAR INTERVENTION  07/11/2020   Procedure: PERIPHERAL VASCULAR INTERVENTION;  Surgeon: Waynetta Sandy, MD;  Location: [place] [facility];  Service: Cardiovascular;;  . RECONSTRUCTION BREAST W/ LATISSIMUS DORSI FLAP Right 05-30-2009   dr Harlow Mares  [facility]  . REDUCTION MAMMAPLASTY Left   . SIMPLE MASTECTOMY Right 12-28-2008    dr Brantley Stage  [facility]  . TRANSTHORACIC ECHOCARDIOGRAM  07-09-2016  dr Aundra Dubin   ef 55-60%, grade 1 diastoic dysfunction/  mild TR  . TRANSURETHRAL RESECTION OF BLADDER TUMOR N/A 03/11/2018   Procedure:  TRANSURETHRAL RESECTION OF BLADDER TUMOR (TURBT) 2-5cm;  Surgeon: Festus Aloe, MD;  Location: [facility];  Service: Urology;  Laterality: N/A;  . UPPER EXTREMITY ANGIOGRAPHY Left 07/11/2020   Procedure: Upper Extremity Angiography;  Surgeon: Waynetta Sandy, MD;  Location: [place] [facility];  Service: Cardiovascular;  Laterality: Left;  Marland Kitchen VAGINAL HYSTERECTOMY  1973  . VIDEO ASSISTED THORACOSCOPY (VATS)/WEDGE RESECTION Right 09-11-2005  &  06-14-2011   dr Fara Boros  [facility]   both RUL    Social History   Socioeconomic History  . Marital status: Married    Spouse name: Not on file  . Number of children: 3  . Years of education: 34  . Highest education level: Not on file  Occupational History  . Occupation: Chiropractor: RETIRED    Comment: retired  Tobacco Use  . Smoking status: Former Smoker    Packs/day: 0.75    Years: 50.00    Pack years: 37.50    Types: Cigarettes    Quit date: 05/18/2016    Years since quitting: 4.8  . Smokeless tobacco: Never Used  Vaping Use  . Vaping Use: Never used  Substance and Sexual Activity  . Alcohol use: Yes    Alcohol/week: 10.0 standard  drinks    Types: 10 Cans of beer per week  . Drug use: No  . Sexual activity: Not on file  Other Topics Concern  . Not on file  Social History Narrative   HSG, beauty school. Married '69 -52 yrs/widowed; married '79 - 35yr/divorced; married '87.  2 sons - '70, '74, 1 srtep-son; 1 grandchild. 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 SO - good health.  NO history of abuse.  ACP - full code and all heroic measures.    Social Determinants of Health   Financial Resource Strain: Low Risk   . Difficulty of Paying Living Expenses: Not hard at all  Food Insecurity: No Food Insecurity  . Worried About Charity fundraiser in the Last Year: Never true  . Ran Out of Food in the Last Year: Never true  Transportation Needs: No Transportation Needs  . Lack of Transportation (Medical): No  . Lack of Transportation (Non-Medical): No  Physical Activity: Inactive  . Days of Exercise per Week: 0 days  . Minutes of Exercise per Session: 0 min  Stress: No Stress Concern Present  . Feeling of Stress : Not at all  Social Connections: Socially Isolated  . Frequency of Communication with Friends and Family: Once a week  . Frequency of Social Gatherings with Friends and Family: Never  . Attends Religious Services: Never  . Active Member of Clubs or Organizations: No  . Attends Archivist Meetings: Never  . Marital Status: Married  Human resources officer Violence: Not At Risk  . Fear of Current or Ex-Partner: No  . Emotionally Abused: No  . Physically Abused: No  . Sexually Abused: No    Allergies  Allergen Reactions  . Sulfa Antibiotics Swelling    Family History  Problem Relation Age of Onset  . Colon cancer Mother        colon  . COPD Mother        brown lung  . Hypertension Mother   . Diabetes Mother   . Emphysema Mother   . Coronary artery disease Father   . Heart attack Father   . Sudden death Father   . Heart disease Father   . Cancer Sister  throat  . Hypertension Sister   .  Coronary artery disease Brother   . Heart attack Brother        early 76s  . Throat cancer Sister   . Ovarian cancer Sister        fallopian tube cancer in her 74s  . Melanoma Sister   . Hypertension Sister   . Pancreatic cancer Sister   . Melanoma Niece        dx in her 70s  . Breast cancer Niece 73    Prior to Admission medications   Medication Sig Start Date End Date Taking? Authorizing Provider  acetaminophen (TYLENOL) 325 MG tablet Take 2 tablets (650 mg total) by mouth every 6 (six) hours as needed for mild pain (or Fever >/= 101). 08/21/20   Cherene Altes, MD  albuterol (VENTOLIN HFA) 108 (90 Base) MCG/ACT inhaler TAKE 2 PUFFS BY MOUTH EVERY 6 HOURS AS NEEDED FOR WHEEZE OR SHORTNESS OF BREATH 09/05/20   Martyn Ehrich, NP  amLODipine (NORVASC) 5 MG tablet 1 tablet    [provider]  aspirin EC 81 MG tablet Take 1 tablet (81 mg total) by mouth daily. 08/21/20   Cherene Altes, MD  atorvastatin (LIPITOR) 80 MG tablet TAKE 1 TABLET BY MOUTH EVERY DAY 09/19/20   [name], NP  benzonatate (TESSALON PERLES) 100 MG capsule Take 1 capsule (100 mg total) by mouth 3 (three) times daily as needed for cough. 05/29/20 05/29/21  Shelly Coss, MD  Biotin 10000 MCG TABS Take 10,000 mcg by mouth daily.    [provider]  budesonide (PULMICORT) 0.5 MG/2ML nebulizer solution Take 2 mLs (0.5 mg total) by nebulization 2 (two) times daily. 09/15/20   Elie Confer, MD  cilostazol (PLETAL) 100 MG tablet TAKE 1 TABLET BY MOUTH TWICE A DAY 11/28/20   [name], NP  clopidogrel (PLAVIX) 75 MG tablet Take 1 tablet (75 mg total) by mouth daily. 07/11/20 07/11/21  Waynetta Sandy, MD  diclofenac (VOLTAREN) 75 MG EC tablet 1 tablet    [provider]  doxycycline (VIBRA-TABS) 100 MG tablet TAKE 1 TABLET BY MOUTH 2 TIMES DAILY FOR 14 DAYS 09/15/20 09/15/21  Elie Confer, MD  fluticasone (FLONASE) 50 MCG/ACT nasal spray SPRAY 1 SPRAY INTO EACH  NOSTRIL TWICE A DAY 12/16/20   Martinique, [name], MD  furosemide (LASIX) 40 MG tablet TAKE 1 TABLET BY MOUTH TWICE A DAY 11/28/20   Martin[place], [name], MD  hydrOXYzine (ATARAX/VISTARIL) 25 MG tablet Take 25 mg by mouth daily. 12/19/20   [provider]  ipratropium-albuterol (DUONEB) 0.5-2.5 (3) MG/3ML SOLN Inhale 3 mLs into the lungs every 6 (six) hours as needed (sob, wheezing). 04/25/20   Martyn Ehrich, NP  KLOR-CON M20 20 MEQ tablet TAKE 1 TABLET BY MOUTH THREE TIMES A DAY 02/20/21   Martin[place], [name], MD  levothyroxine (SYNTHROID) 112 MCG tablet TAKE 1 TABLET BY MOUTH EVERY DAY IN THE MORNING BEFORE BREAKFAST 12/16/20   [place], [name], MD  linaclotide [facility]) 145 MCG CAPS capsule 1 capsule at least 30 minutes before the first meal of the day on an empty stomach 12/05/18   [provider]  LORazepam (ATIVAN) 2 MG tablet TAKE 1 TABLET BY MOUTH AT BEDTIME. 01/11/21   [place], [name], MD  metoprolol tartrate (LOPRESSOR) 25 MG tablet 1 tablet    [provider]  montelukast (SINGULAIR) 10 MG tablet Take 1 tablet (10 mg total) by mouth  at bedtime. 04/29/20   Martyn Ehrich, NP  pantoprazole (PROTONIX) 40 MG tablet TAKE 1 TABLET BY MOUTH EVERY DAY 02/27/21   Martin[place], [name], MD  PARoxetine (PAXIL) 20 MG tablet TAKE 1 TABLET BY MOUTH EVERYDAY AT BEDTIME 02/27/21   Martin[place], [name], MD  predniSONE (DELTASONE) 10 MG tablet TAKE 1 TABLET BY MOUTH ONCE A DAY 09/15/20 09/15/21  Elie Confer, MD  tamsulosin (FLOMAX) 0.4 MG CAPS capsule TAKE 1 CAPSULE BY MOUTH EVERY DAY 02/08/21   [place], [name], MD  traZODone (DESYREL) 100 MG tablet TAKE 1 TABLET BY MOUTH EVERYDAY AT BEDTIME 12/26/20   [place], [name], MD  TRELEGY ELLIPTA 200-62.5-25 MCG/INH AEPB TAKE 1 PUFF BY MOUTH EVERY DAY 12/02/20   Baird Lyons D, MD    Physical Exam: Vitals:   03/09/21 1124 03/09/21 1200 03/09/21 1315 03/09/21 1330  BP:  118/77 106/74 114/75  Pulse:  99 92 95  Resp:  15 19 17   Temp:      TempSrc:      SpO2:   100% 100% 99%  Weight: 59 kg        . General:  Appears calm and comfortable and is in NAD at the moment.  She appears older than stated age, thin and frail appearing. . Cardiovascular:  RRR, no m/r/g.  . Respiratory:   CTA bilaterally with moderate expiratory wheezing in all lobes.  Normal respiratory effort. . Abdomen:  soft, NT, ND, NABS . Skin:  no rash or induration seen on limited exam . Musculoskeletal:  grossly normal tone BUE/BLE, good ROM, no bony abnormality . Lower extremity:  No LE edema.  Limited foot exam with no ulcerations.  2+ distal pulses. Marland Kitchen Psychiatric:  grossly normal mood and affect, speech fluent and appropriate, AOx3 . Neurologic:  CN 2-12 grossly intact, moves all extremities in coordinated fashion, sensation intact    Radiological Exams on Admission: Independently reviewed - see discussion in A/P where applicable  DG Chest Portable 1 View  Result Date: 03/09/2021 CLINICAL DATA:  Shortness of breath and respiratory distress EXAM: PORTABLE CHEST 1 VIEW COMPARISON:  LEs 09/15/2020 FINDINGS: Hyperinflation and chronic bronchitic markings. Postoperative changes to the right upper lung. There is no edema, air bronchogram, effusion, or pneumothorax. Normal heart size and mediastinal contours. IMPRESSION: COPD without acute superimposed finding. Electronically Signed   By: Monte Fantasia M.D.   On: 03/09/2021 11:40    EKG: Independently reviewed.  Sinus or ectopic atrial rhythm, rate 97 bpm.   Labs on Admission: I have personally reviewed the available labs and imaging studies at the time of the admission.  Pertinent labs: BNP 59, COVID-19 PCR negative, WBC 14, Influenza A/B negative     Assessment: Acute on chronic hypoxic failure secondary to COPD exacerbation Leukocytosis likely secondary to chronic steroid use COPD with chronic hypoxic respiratory failure on 2 L oxygen at baseline Congestive heart failure with mildly reduced LVEF 45 to 50%, not in acute  decompensation COPD Gold 3 Hypertension Former smoker Hyperlipidemia Severe peripheral arterial disease Mesenteric vascular disease Left subclavian artery occlusion status post stent Mild carotid artery disease History of right lung nodule status post VATS with wedge resection History of right breast cancer, in remission  Plan: Admit to the PCU under inpatient status. No longer on BIPAP. Currently on 4 L oxygen supplementation and saturating well.  She is on 2 L oxygen at baseline.  Plain film imaging personally reviewed by me shows no infiltrate, consolidation, pleural effusions, or  pulmonary edema.  Will be started on IV steroids, IV antibiotics, breathing treatments scheduled and as needed, inhalers scheduled and as needed.  Repeat ABG in the morning.  COVID-19 PCR is negative.  Supportive care with chest PT, flutter valve and incentive spirometry.  Wean oxygen as tolerated. Resume other home medications. I spoke with the patient's pulmonologist and he recommended obtaining a full RVP panel. Recommended holding off on consult for now since she is improving. Can consider consulting them if she starts to get worse. He recommended an ABG close to discharge and if PCO2 is high to discharge with home BIPAP.   Level of care:  PCU DVT prophylaxis: Lovenox subcu Code Status: Full code Consults called: None for now Admission status: Inpatient   Leslee Home MD [facility]   How to contact the [facility] Attending or Consulting provider [place] or covering provider during after hours [place], for this patient?  1. Check the care team in [facility] and look for a) attending/consulting TRH provider listed and b) the [facility] team listed 2. Log into [email or web address] and use [place]'s universal password to access. If you do not have the password, please contact the hospital operator. 3. Locate the [facility] provider you are looking for under [facility] and page to a number that you can be directly  reached. 4. If you still have difficulty reaching the provider, please page the [facility] (Director on Call) for the Hospitalists listed on amion for assistance.   03/09/2021, 3:44 PM

## 2021-03-09 NOTE — ED Triage Notes (Signed)
Pt from home via [place] with C/O SHOB. Pt on CPAP at time of arrival. Pt received 10mg  albuterol, 0.5 atrovent, 125mg  solumedrol, and 2g magnesium en route.

## 2021-03-09 NOTE — Progress Notes (Signed)
Patient taken off BiPAP at this time and placed on 4L nasal cannula. RT will continue to monitor.

## 2021-03-09 NOTE — ED Provider Notes (Signed)
[facility] EMERGENCY DEPARTMENT Provider Note   CSN: [id number] Arrival date & time: 03/09/21  1114     History Chief Complaint  Patient presents with  . Respiratory Distress    MORIYA MITCHELL is a 75 y.o. female.  HPI Patient with history of COPD.  For the last couple weeks has had worsening shortness of breath.  Hypoxic with EMS to sats in the 80s.  Started on CPAP.  Had been given 10 of albuterol 0.5 Atrovent 125 mg of Solu-Medrol and 2 g of magnesium.  Feeling somewhat better but still requiring CPAP.  Has had a cough with some mild sputum production.  No fevers.  Also history of CHF.    Past Medical History:  Diagnosis Date  . Bilateral leg cramps   . Bladder neoplasm   . Cancer ([place])   . CHF (congestive heart failure) ([place])   . Chronic deep vein thrombosis (DVT) of right lower extremity ([place])    05/ 2018  chronic non-occlusive DVT RLE  . COPD, frequent exacerbations ([place])    03-06-2018 per pt last exacerbation 12/ 2018  . Coronary artery disease    cardiologist-  dr Aundra Dubin-- 03-11-2018 er cath , 80% stenosis in the ostial second diagonal  . Dyspnea on minimal exertion   . Emphysema/COPD ([place])    CAT score- 17  . Family history of breast cancer   . Family history of colon cancer   . Family history of melanoma   . Family history of ovarian cancer   . Family history of pancreatic cancer   . Fibromyalgia 1995  . GERD (gastroesophageal reflux disease)   . Hiatal hernia   . History of breast cancer   . History of diverticulitis of colon    2002  s/p  sigmoid colectomy  . History of multiple pulmonary nodules    hx RUL nodules x2  s/p right VATS w/ wedge resection 09-11-2005 and 06-14-2011  both  necrotizing granulomatous inflammation w/ cystic area of necrosis & focal calcification  . History of right breast cancer oncologist-  dr Jana Hakim-- no recurrence   dx 2010--  IDC, Stage IA , ER/PR+,  (pT1c pN0) 11-09-2008 right lumpectomy;  12-18-2008 right  simple mastectomy for DCIS margins;  completed chemotherapy 2010 (no radiation) and completed antiestrogen therapy  . Hx of colonic polyps    Dr. Cristina Gong -last study '11  . Hyperlipidemia   . Hypertension   . Hypothyroidism   . 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 Nocturia   . OA (osteoarthritis)    thumbs  . On supplemental oxygen therapy    03-06-2018 per pt uses only at night,  checks O2 sats at home,  stated am sat 89% after moving around average 93-94% with RA  . Osteoporosis   . Peripheral arterial occlusive disease [facility]) vascular-- dr chen/ dr Donzetta Matters   proximal right SFA severe focal stenosis with collaterals from the PFA/  04/ 2012 occluded celiac and SMA arteries with distal reconstitution w/ patent IMA  . Peripheral vascular disease ([place])    chronic DVT RLE,  mesenteric vascular disease  . Right lower lobe pulmonary nodule    Chest CT 08-29-2017  . Stage 3 severe COPD by GOLD classification ([place]) hx frequent exacerbations--   pulmologist-  dr Maryann Alar--  per lov note , dated 12-20-2017, oxyogen 2L is prescribed for use with exertion (but pt only uses mostly at night), O2 sats on RA run  the 80s , this day sat 86% RA and with 2L O2 sat 92%  . Varicose veins of leg with swelling    varicose vein surgery - Dr. Aleda Grana  . Wears glasses   . Wears hearing aid in both ears     Patient Active Problem List   Diagnosis Date Noted  . Hemoptysis 10/05/2020  . Hypokalemia 09/26/2020  . OSA (obstructive sleep apnea) 09/11/2020  . SOB (shortness of breath)   . Acute on chronic systolic CHF (congestive heart failure) ([place])   . Acute respiratory failure with hypoxia ([place]) 08/14/2020  . Esophageal dysmotility 08/14/2020  . Snoring 06/26/2020  . HFrEF (heart failure with reduced ejection fraction) ([place]) 06/07/2020  . Shortness of breath   . Acute systolic CHF (congestive heart failure) ([place]) 05/21/2020  . Major depressive disorder, single  episode, moderate degree ([place]) 05/21/2020  . COPD exacerbation ([place]) 05/20/2020  . Acute on chronic respiratory failure with hypoxia ([place]) 05/20/2020  . Genetic testing 12/12/2018  . Family history of breast cancer   . Family history of ovarian cancer   . Family history of pancreatic cancer   . Family history of colon cancer   . Family history of melanoma   . History of breast cancer   . Chronic respiratory failure with hypoxia ([place]) 10/14/2017  . COPD with acute exacerbation ([place]) 10/10/2017  . Flu vaccine need 09/09/2017  . Anxiety disorder 09/09/2017  . Fibromyalgia 09/09/2017  . Thrombocytosis 06/21/2017  . Malignant neoplasm of overlapping sites of right breast in female, estrogen receptor positive ([place]) 06/05/2017  . Dermatitis 04/02/2017  . Edema of right lower extremity 02/26/2017  . Positive ANA (antinuclear antibody) 02/08/2017  . Elevated IgE level and eosinophilia 12/31/2016  . Oral thrush 11/28/2016  . Rash and nonspecific skin eruption 11/28/2016  . Allergic rhinitis 11/28/2016  . History of bacterial pneumonia 08/09/2016  . Routine general medical examination at a health care facility 08/12/2015  . Insomnia 03/04/2015  . Breast cancer, right breast ([place]) 02/17/2015  . CAD (coronary artery disease) 07/11/2011  . Tobacco abuse 03/27/2011  . Chronic mesenteric ischemia ([place]) 03/27/2011  . Solitary pulmonary nodule 03/27/2011  . PAD (peripheral artery disease) ([place]) 03/04/2011  . Arthropathy 11/09/2010  . Hypothyroidism 09/20/2009  . Hyperlipidemia 09/20/2009  . Hearing loss 09/14/2008  . Essential hypertension 09/08/2007  . COPD, frequent exacerbations ([place]) 09/08/2007  . GERD 09/08/2007    Past Surgical History:  Procedure Laterality Date  . ABDOMINAL AORTOGRAM N/A 07/11/2020   Procedure: ABDOMINAL AORTOGRAM;  Surgeon: Waynetta Sandy, MD;  Location: [facility];  Service: Cardiovascular;  Laterality: N/A;  . AORTIC ARCH ANGIOGRAPHY N/A  07/11/2020   Procedure: AORTIC ARCH ANGIOGRAPHY;  Surgeon: Waynetta Sandy, MD;  Location: [facility];  Service: Cardiovascular;  Laterality: N/A;  . BREAST EXCISIONAL BIOPSY Left 10/15/2008  . BREAST LUMPECTOMY W/ NEEDLE LOCALIZATION Right 11-09-2008    [facility]  . CARDIAC CATHETERIZATION  03-12-2011   dr Aundra Dubin   70-80% ostial stenosis in small second diagonal (appears to small for intervention),  dLAD 40-50%,  minimal luminal irregulartieis involoving LCFx and RCA,  LVEF 55%  . CATARACT EXTRACTION W/ INTRAOCULAR LENS  IMPLANT, BILATERAL  2011  . CYSTOSCOPY N/A 03/11/2018   Procedure: CYSTOSCOPY WITH INSTILLATION OF POST OPERATIVE EPIRUBICIN;  Surgeon: Festus Aloe, MD;  Location: [facility];  Service: Urology;  Laterality: N/A;  . LEFT HEART CATH AND CORONARY ANGIOGRAPHY N/A 05/25/2020   Procedure: LEFT  HEART CATH AND CORONARY ANGIOGRAPHY;  Surgeon: Troy Sine, MD;  Location: [facility];  Service: Cardiovascular;  Laterality: N/A;  . MASTECTOMY Right   . PARTIAL COLECTOMY  2002   sigmoid and appectomy (diverticulitis)  . PARTIAL MASTECTOMY WITH NEEDLE LOCALIZATION Left 02/23/2013   Procedure:  LEFT PARTIAL MASTECTOMY WITH NEEDLE LOCALIZATION;  Surgeon: Adin Hector, MD;  Location: [facility];  Service: General;  Laterality: Left;  . PERIPHERAL VASCULAR INTERVENTION  07/11/2020   Procedure: PERIPHERAL VASCULAR INTERVENTION;  Surgeon: Waynetta Sandy, MD;  Location: [facility];  Service: Cardiovascular;;  . RECONSTRUCTION BREAST W/ LATISSIMUS DORSI FLAP Right 05-30-2009   dr Harlow Mares  [facility]  . REDUCTION MAMMAPLASTY Left   . SIMPLE MASTECTOMY Right 12-28-2008    dr Brantley Stage  [facility]  . TRANSTHORACIC ECHOCARDIOGRAM  07-09-2016  dr Aundra Dubin   ef 55-60%, grade 1 diastoic dysfunction/  mild TR  . TRANSURETHRAL RESECTION OF BLADDER TUMOR N/A 03/11/2018   Procedure: TRANSURETHRAL RESECTION OF BLADDER TUMOR (TURBT) 2-5cm;  Surgeon:  Festus Aloe, MD;  Location: [facility];  Service: Urology;  Laterality: N/A;  . UPPER EXTREMITY ANGIOGRAPHY Left 07/11/2020   Procedure: Upper Extremity Angiography;  Surgeon: Waynetta Sandy, MD;  Location: [facility];  Service: Cardiovascular;  Laterality: Left;  Marland Kitchen VAGINAL HYSTERECTOMY  1973  . VIDEO ASSISTED THORACOSCOPY (VATS)/WEDGE RESECTION Right 09-11-2005  &  06-14-2011   dr hendrickso  [facility]   both RUL     OB History   No obstetric history on file.     Family History  Problem Relation Age of Onset  . Colon cancer Mother        colon  . COPD Mother        brown lung  . Hypertension Mother   . Diabetes Mother   . Emphysema Mother   . Coronary artery disease Father   . Heart attack Father   . Sudden death Father   . Heart disease Father   . Cancer Sister        throat  . Hypertension Sister   . Coronary artery disease Brother   . Heart attack Brother        early 35s  . Throat cancer Sister   . Ovarian cancer Sister        fallopian tube cancer in her 47s  . Melanoma Sister   . Hypertension Sister   . Pancreatic cancer Sister   . Melanoma Niece        dx in her 52s  . Breast cancer Niece 10    Social History   Tobacco Use  . Smoking status: Former Smoker    Packs/day: 0.75    Years: 50.00    Pack years: 37.50    Types: Cigarettes    Quit date: 05/18/2016    Years since quitting: 4.8  . Smokeless tobacco: Never Used  Vaping Use  . Vaping Use: Never used  Substance Use Topics  . Alcohol use: Yes    Alcohol/week: 10.0 standard drinks    Types: 10 Cans of beer per week  . Drug use: No    Home Medications Prior to Admission medications   Medication Sig Start Date End Date Taking? Authorizing Provider  acetaminophen (TYLENOL) 325 MG tablet Take 2 tablets (650 mg total) by mouth every 6 (six) hours as needed for mild pain (or Fever >/= 101). 08/21/20   Cherene Altes, MD  albuterol (VENTOLIN HFA) 108 (90  Base) MCG/ACT inhaler TAKE 2  PUFFS BY MOUTH EVERY 6 HOURS AS NEEDED FOR WHEEZE OR SHORTNESS OF BREATH 09/05/20   Martyn Ehrich, NP  amLODipine (NORVASC) 5 MG tablet 1 tablet    [provider]  aspirin EC 81 MG tablet Take 1 tablet (81 mg total) by mouth daily. 08/21/20   Cherene Altes, MD  atorvastatin (LIPITOR) 80 MG tablet TAKE 1 TABLET BY MOUTH EVERY DAY 09/19/20   [name], NP  benzonatate (TESSALON PERLES) 100 MG capsule Take 1 capsule (100 mg total) by mouth 3 (three) times daily as needed for cough. 05/29/20 05/29/21  Shelly Coss, MD  Biotin 10000 MCG TABS Take 10,000 mcg by mouth daily.    [provider]  budesonide (PULMICORT) 0.5 MG/2ML nebulizer solution Take 2 mLs (0.5 mg total) by nebulization 2 (two) times daily. 09/15/20   Elie Confer, MD  cilostazol (PLETAL) 100 MG tablet TAKE 1 TABLET BY MOUTH TWICE A DAY 11/28/20   [name], NP  clopidogrel (PLAVIX) 75 MG tablet Take 1 tablet (75 mg total) by mouth daily. 07/11/20 07/11/21  Waynetta Sandy, MD  diclofenac (VOLTAREN) 75 MG EC tablet 1 tablet    [provider]  doxycycline (VIBRA-TABS) 100 MG tablet TAKE 1 TABLET BY MOUTH 2 TIMES DAILY FOR 14 DAYS 09/15/20 09/15/21  Elie Confer, MD  fluticasone (FLONASE) 50 MCG/ACT nasal spray SPRAY 1 SPRAY INTO EACH NOSTRIL TWICE A DAY 12/16/20   [place], [name], MD  furosemide (LASIX) 40 MG tablet TAKE 1 TABLET BY MOUTH TWICE A DAY 11/28/20   [place], [name], MD  hydrOXYzine (ATARAX/VISTARIL) 25 MG tablet Take 25 mg by mouth daily. 12/19/20   [provider]  ipratropium-albuterol (DUONEB) 0.5-2.5 (3) MG/3ML SOLN Inhale 3 mLs into the lungs every 6 (six) hours as needed (sob, wheezing). 04/25/20   Martyn Ehrich, NP  KLOR-CON M20 20 MEQ tablet TAKE 1 TABLET BY MOUTH THREE TIMES A DAY 02/20/21   [place], [name], MD  levothyroxine (SYNTHROID) 112 MCG tablet TAKE 1 TABLET BY MOUTH EVERY DAY IN THE MORNING BEFORE BREAKFAST 12/16/20   [place], [name], MD   linaclotide [facility]) 145 MCG CAPS capsule 1 capsule at least 30 minutes before the first meal of the day on an empty stomach 12/05/18   [provider]  LORazepam (ATIVAN) 2 MG tablet TAKE 1 TABLET BY MOUTH AT BEDTIME. 01/11/21   [place], [name], MD  metoprolol tartrate (LOPRESSOR) 25 MG tablet 1 tablet    [provider]  montelukast (SINGULAIR) 10 MG tablet Take 1 tablet (10 mg total) by mouth at bedtime. 04/29/20   Martyn Ehrich, NP  pantoprazole (PROTONIX) 40 MG tablet TAKE 1 TABLET BY MOUTH EVERY DAY 02/27/21   [place], [name], MD  PARoxetine (PAXIL) 20 MG tablet TAKE 1 TABLET BY MOUTH EVERYDAY AT BEDTIME 02/27/21   [place], [name], MD  predniSONE (DELTASONE) 10 MG tablet TAKE 1 TABLET BY MOUTH ONCE A DAY 09/15/20 09/15/21  Elie Confer, MD  tamsulosin (FLOMAX) 0.4 MG CAPS capsule TAKE 1 CAPSULE BY MOUTH EVERY DAY 02/08/21   [place], [name], MD  traZODone (DESYREL) 100 MG tablet TAKE 1 TABLET BY MOUTH EVERYDAY AT BEDTIME 12/26/20   [place], [name], MD  TRELEGY ELLIPTA 200-62.5-25 MCG/INH AEPB TAKE 1 PUFF BY MOUTH EVERY DAY 12/02/20   Deneise Lever, MD    Allergies    Sulfa antibiotics  Review of Systems  Review of Systems  Constitutional: Positive for appetite change.  HENT: Negative for congestion.   Respiratory: Positive for cough, shortness of breath and wheezing.   Cardiovascular: Negative for chest pain and leg swelling.  Gastrointestinal: Negative for abdominal pain.  Genitourinary: Negative for flank pain.  Musculoskeletal: Negative for back pain.  Skin: Negative for rash.  Neurological: Negative for weakness.  Psychiatric/Behavioral: Negative for confusion.    Physical Exam Updated Vital Signs BP 114/75   Pulse 95   Temp 97.9 F (36.6 C) (Oral)   Resp 17   Wt 59 kg   SpO2 99%   BMI 20.98 kg/m   Physical Exam Vitals and nursing note reviewed.  HENT:     Head: Atraumatic.  Cardiovascular:     Rate and Rhythm: Regular rhythm.   Pulmonary:     Breath sounds: Wheezing present.     Comments: diffuse wheezing and prolonged expirations Musculoskeletal:     Cervical back: Neck supple.  Neurological:     Mental Status: She is alert.     ED Results / Procedures / Treatments   Labs (all labs ordered are listed, but only abnormal results are displayed) Labs Reviewed  COMPREHENSIVE METABOLIC PANEL - Abnormal; Notable for the following components:      Result Value   Glucose, Bld 111 (*)    BUN 7 (*)    Calcium 8.3 (*)    Albumin 3.0 (*)    All other components within normal limits  CBC WITH DIFFERENTIAL/PLATELET - Abnormal; Notable for the following components:   WBC 14.7 (*)    Platelets 619 (*)    Neutro Abs 11.1 (*)    Monocytes Absolute 1.7 (*)    Abs Immature Granulocytes 0.10 (*)    All other components within normal limits  TROPONIN I (HIGH SENSITIVITY) - Abnormal; Notable for the following components:   Troponin I (High Sensitivity) 18 (*)    All other components within normal limits  TROPONIN I (HIGH SENSITIVITY) - Abnormal; Notable for the following components:   Troponin I (High Sensitivity) 27 (*)    All other components within normal limits  RESP PANEL BY RT-PCR (FLU A&B, COVID) ARPGX2  BRAIN NATRIURETIC PEPTIDE    EKG EKG Interpretation  Date/Time:  Thursday Mar 09 2021 11:16:56 EDT Ventricular Rate:  97 PR Interval:  151 QRS Duration: 97 QT Interval:  333 QTC Calculation: 421 R Axis:   129 Text Interpretation: Sinus or ectopic atrial rhythm Probable left atrial enlargement Anteroseptal infarct, age indeterminate Confirmed by Davonna Belling [phone]) on 03/09/2021 2:51:23 PM   Radiology DG Chest Portable 1 View  Result Date: 03/09/2021 CLINICAL DATA:  Shortness of breath and respiratory distress EXAM: PORTABLE CHEST 1 VIEW COMPARISON:  LEs 09/15/2020 FINDINGS: Hyperinflation and chronic bronchitic markings. Postoperative changes to the right upper lung. There is no edema, air  bronchogram, effusion, or pneumothorax. Normal heart size and mediastinal contours. IMPRESSION: COPD without acute superimposed finding. Electronically Signed   By: Monte Fantasia M.D.   On: 03/09/2021 11:40    Procedures Procedures   Medications Ordered in ED Medications - No data to display  ED Course  I have reviewed the triage vital signs and the nursing notes.  Pertinent labs & imaging results that were available during my care of the patient were reviewed by me and considered in my medical decision making (see chart for details).    MDM Rules/Calculators/A&P  Patient with shortness of breath.  On CPAP by EMS and required BiPAP here initially.  Had aggressive treated by EMS.  Improved while here.  Feeling better.  X-ray reassuring and just shows COPD.  Feeling better and was actually sleeping.  Will attempt to wean off the BiPAP but will require admission to the hospital.  PCP is [facility].  Will discuss with hospitalist.  CRITICAL CARE Performed by: Davonna Belling Total critical care time: 30 minutes Critical care time was exclusive of separately billable procedures and treating other patients. Critical care was necessary to treat or prevent imminent or life-threatening deterioration. Critical care was time spent personally by me on the following activities: development of treatment plan with patient and/or surrogate as well as nursing, discussions with consultants, evaluation of patient's response to treatment, examination of patient, obtaining history from patient or surrogate, ordering and performing treatments and interventions, ordering and review of laboratory studies, ordering and review of radiographic studies, pulse oximetry and re-evaluation of patient's condition. Final Clinical Impression(s) / ED Diagnoses Final diagnoses:  COPD exacerbation [facility])    Rx / DC Orders ED Discharge Orders    None       Davonna Belling, MD 03/09/21  1452

## 2021-03-10 DIAGNOSIS — I25119 Atherosclerotic heart disease of native coronary artery with unspecified angina pectoris: Secondary | ICD-10-CM | POA: Diagnosis not present

## 2021-03-10 DIAGNOSIS — E039 Hypothyroidism, unspecified: Secondary | ICD-10-CM

## 2021-03-10 DIAGNOSIS — J441 Chronic obstructive pulmonary disease with (acute) exacerbation: Secondary | ICD-10-CM | POA: Diagnosis not present

## 2021-03-10 DIAGNOSIS — I10 Essential (primary) hypertension: Secondary | ICD-10-CM

## 2021-03-10 DIAGNOSIS — E7800 Pure hypercholesterolemia, unspecified: Secondary | ICD-10-CM

## 2021-03-10 DIAGNOSIS — J9621 Acute and chronic respiratory failure with hypoxia: Principal | ICD-10-CM

## 2021-03-10 DIAGNOSIS — K219 Gastro-esophageal reflux disease without esophagitis: Secondary | ICD-10-CM

## 2021-03-10 DIAGNOSIS — I5020 Unspecified systolic (congestive) heart failure: Secondary | ICD-10-CM

## 2021-03-10 LAB — BASIC METABOLIC PANEL
Anion gap: 10 (ref 5–15)
BUN: 15 mg/dL (ref 8–23)
CO2: 22 mmol/L (ref 22–32)
Calcium: 8.8 mg/dL — ABNORMAL LOW (ref 8.9–10.3)
Chloride: 101 mmol/L (ref 98–111)
Creatinine, Ser: 0.7 mg/dL (ref 0.44–1.00)
GFR, Estimated: >60 mL/min (ref >60)
Glucose, Bld: 227 mg/dL — ABNORMAL HIGH (ref 70–99)
Potassium: 3.8 mmol/L (ref 3.5–5.1)
Sodium: 133 mmol/L — ABNORMAL LOW (ref 135–145)

## 2021-03-10 LAB — CBC
HCT: 32.6 % — ABNORMAL LOW (ref 36.0–46.0)
Hemoglobin: 10.6 g/dL — ABNORMAL LOW (ref 12.0–15.0)
MCH: 28.2 pg (ref 26.0–34.0)
MCHC: 32.5 g/dL (ref 30.0–36.0)
MCV: 86.7 fL (ref 80.0–100.0)
Platelets: 595 10*3/uL — ABNORMAL HIGH (ref 150–400)
RBC: 3.76 MIL/uL — ABNORMAL LOW (ref 3.87–5.11)
RDW: 13.1 % (ref 11.5–15.5)
WBC: 8.9 10*3/uL (ref 4.0–10.5)
nRBC: 0 % (ref 0.0–0.2)

## 2021-03-10 LAB — RESPIRATORY PANEL BY PCR

## 2021-03-10 LAB — BLOOD GAS, ARTERIAL
Acid-base deficit: 1.7 mmol/L (ref 0.0–2.0)
Bicarbonate: 23 mmol/L (ref 20.0–28.0)
Drawn by: 311011
FIO2: 28
O2 Saturation: 93.9 %
Patient temperature: 36.8
pCO2 arterial: 42.3 mmHg (ref 32.0–48.0)
pH, Arterial: 7.354 (ref 7.350–7.450)
pO2, Arterial: 74.2 mmHg — ABNORMAL LOW (ref 83.0–108.0)

## 2021-03-10 LAB — HIV ANTIBODY (ROUTINE TESTING W REFLEX): HIV Screen 4th Generation wRfx: NONREACTIVE

## 2021-03-10 LAB — GLUCOSE, CAPILLARY
Glucose-Capillary: 151 mg/dL — ABNORMAL HIGH (ref 70–99)
Glucose-Capillary: 137 mg/dL — ABNORMAL HIGH (ref 70–99)

## 2021-03-10 LAB — PROCALCITONIN: Procalcitonin: <0.1 ng/mL

## 2021-03-10 MED ORDER — METHYLPREDNISOLONE SODIUM SUCC 125 MG IJ SOLR
60.0000 mg | Freq: Two times a day (BID) | INTRAMUSCULAR | Status: DC
  Administered 2021-03-10 – 2021-03-12 (×4): 60 mg via INTRAVENOUS
  Filled 2021-03-10 (×5): qty 2

## 2021-03-10 MED ORDER — INSULIN ASPART 100 UNIT/ML IJ SOLN
0.0000 [IU] | Freq: Every day | INTRAMUSCULAR | Status: DC
Start: 2021-03-10 — End: 2021-03-12

## 2021-03-10 MED ORDER — INSULIN ASPART 100 UNIT/ML IJ SOLN
0.0000 [IU] | Freq: Three times a day (TID) | INTRAMUSCULAR | Status: DC
Start: 2021-03-10 — End: 2021-03-12
  Administered 2021-03-10 – 2021-03-11 (×2): 2 [IU] via SUBCUTANEOUS
  Administered 2021-03-12: 1 [IU] via SUBCUTANEOUS

## 2021-03-10 MED ORDER — ADULT MULTIVITAMIN W/MINERALS CH
1.0000 | ORAL_TABLET | Freq: Every day | ORAL | Status: DC
  Administered 2021-03-10 – 2021-03-12 (×3): 1 via ORAL
  Filled 2021-03-10 (×3): qty 1

## 2021-03-10 MED ORDER — ENSURE ENLIVE PO LIQD
237.0000 mL | Freq: Two times a day (BID) | ORAL | Status: DC
  Administered 2021-03-11: 237 mL via ORAL

## 2021-03-10 MED ORDER — LOPERAMIDE HCL 2 MG PO CAPS
2.0000 mg | ORAL_CAPSULE | ORAL | Status: DC | PRN
  Administered 2021-03-10 – 2021-03-11 (×2): 2 mg via ORAL
  Filled 2021-03-10 (×4): qty 1

## 2021-03-10 MED ORDER — IPRATROPIUM-ALBUTEROL 0.5-2.5 (3) MG/3ML IN SOLN
3.0000 mL | Freq: Three times a day (TID) | RESPIRATORY_TRACT | Status: DC
  Administered 2021-03-10 – 2021-03-12 (×6): 3 mL via RESPIRATORY_TRACT
  Filled 2021-03-10 (×6): qty 3

## 2021-03-10 NOTE — Plan of Care (Signed)

## 2021-03-10 NOTE — Progress Notes (Signed)
Initial Nutrition Assessment  DOCUMENTATION CODES:   Not applicable  INTERVENTION:  Ensure Enlive po BID, each supplement provides 350 kcal and 20 grams of protein  MVI with minerals daily  NUTRITION DIAGNOSIS:   Increased nutrient needs related to chronic illness (COPD exacerbation) as evidenced by estimated needs.   GOAL:   Patient will meet greater than or equal to 90% of their needs  MONITOR:   PO intake,Supplement acceptance,Weight trends,Labs,I & O's  REASON FOR ASSESSMENT:   Consult Assessment of nutrition requirement/status  ASSESSMENT:   Pt admitted with acute on chronic respiratory failure 2/2 COPD exacerbation. PMH includes HTN, COPD, chronic hypoxic respiratory failure on 2L oxygen at baseline, h/o R breast Ca, HLD, PAD, mesenteric vascular disease, L subclavian artery occlusion s/p stent, mild CAD, CHF.   Pt unavailable at time of RD visit. RN reports pt endorses good appetite and has been inquiring about food. Will provide pt with supplements to provide additional kcals/protein given increased nutrient needs 2/2 COPD exacerbation.   Reviewed weight history. No significant weight changes noted.   No UOP documented x24 hours  Medications: . amitriptyline  50 mg Oral Daily  . aspirin EC  81 mg Oral Daily  . atorvastatin  80 mg Oral Daily  . cilostazol  100 mg Oral BID  . clopidogrel  75 mg Oral Daily  . enoxaparin (LOVENOX) injection  40 mg Subcutaneous QHS  . [START ON 03/11/2021] feeding supplement  237 mL Oral BID BM  . furosemide  40 mg Oral BID  . insulin aspart  0-5 Units Subcutaneous QHS  . insulin aspart  0-9 Units Subcutaneous TID WC  . ipratropium-albuterol  3 mL Nebulization TID  . levothyroxine  112 mcg Oral QAC breakfast  . LORazepam  2 mg Oral QHS  . methylPREDNISolone (SOLU-MEDROL) injection  60 mg Intravenous BID  . montelukast  10 mg Oral QHS  . multivitamin with minerals  1 tablet Oral Daily  . pantoprazole  40 mg Oral Daily  .  PARoxetine  20 mg Oral Daily  . potassium chloride SA  20 mEq Oral TID  . tamsulosin  0.4 mg Oral Daily  . traZODone  100 mg Oral QHS  . umeclidinium-vilanterol  1 puff Inhalation Daily   Labs: Recent Labs  Lab 03/09/21 1119 03/10/21 0026  NA 135 133*  K 3.5 3.8  CL 102 101  CO2 25 22  BUN 7* 15  CREATININE 0.79 0.70  CALCIUM 8.3* 8.8*  GLUCOSE 111* 227*   Diet Order:   Diet Order            Diet Carb Modified Fluid consistency: Thin; Room service appropriate? Yes  Diet effective now                 EDUCATION NEEDS:   No education needs have been identified at this time  Skin:  Skin Assessment: Reviewed RN Assessment  Last BM:  5/26  Height:   Ht Readings from Last 1 Encounters:  03/09/21 5\' 6"  (1.676 m)    Weight:   Wt Readings from Last 10 Encounters:  03/09/21 58.1 kg  12/29/20 59.1 kg  11/25/20 59.9 kg  10/05/20 61 kg  09/27/20 60.6 kg  09/26/20 61.5 kg  09/15/20 58.2 kg  08/20/20 61.3 kg  07/27/20 61.2 kg  07/11/20 62.1 kg   BMI:  Body mass index is 20.67 kg/m.  Estimated Nutritional Needs:   Kcal:  1700-1900  Protein:  85-95g  Fluid:  >1.7L/d  Larkin Ina, MS, RD, LDN RD pager number and weekend/on-call pager number located in [place].

## 2021-03-10 NOTE — Evaluation (Signed)
Occupational Therapy Evaluation Patient Details Name: [name] MRN: [id number] DOB: [birth year] Today's Date: 03/10/2021    History of Present Illness 75 y.o. 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  Patient presented due to SOB for the past 2 to 3 weeks.  Associated purulent cough that is clear.   Clinical Impression   Patient admitted for the above diagnosis.  PTA she lives at home with her spouse, who is ale to assist as needed.  Barriers are listed below.  Currently, except for increasing O2 need, she is essentially at her baseline for ADL and in room mobility/toileting.  No acute OT needs identified, and she is not interested in home health services.      Follow Up Recommendations  No OT follow up    Equipment Recommendations  Tub/shower seat    Recommendations for Other Services       Precautions / Restrictions Precautions Precautions: Fall Precaution Comments: O2 sats Restrictions Weight Bearing Restrictions: No      Mobility Bed Mobility               General bed mobility comments: sitting EOB eating lunch Patient Response: Cooperative  Transfers Overall transfer level: Modified independent                    Balance Overall balance assessment: Mild deficits observed, not formally tested                                         ADL either performed or assessed with clinical judgement   ADL Overall ADL's : At baseline                                       General ADL Comments: needing supplemental  O2 during the day for ADL.  Typically does not use O2 during the day.     Vision Baseline Vision/History: Wears glasses Wears Glasses: At all times Patient Visual Report: No change from baseline       Perception     Praxis      Pertinent Vitals/Pain Pain Assessment: No/denies pain     Hand Dominance Right   Extremity/Trunk Assessment Upper Extremity Assessment Upper Extremity Assessment: Overall WFL for tasks assessed   Lower Extremity Assessment Lower Extremity Assessment: Defer to PT evaluation   Cervical / Trunk Assessment Cervical / Trunk Assessment: Normal   Communication Communication Communication: HOH   Cognition Arousal/Alertness: Awake/alert Behavior During Therapy: WFL for tasks assessed/performed Overall Cognitive Status: Within Functional Limits for tasks assessed                                       Home Living Family/patient expects to be discharged to:: Private residence Living Arrangements: Spouse/significant other Available Help at Discharge: Family;Available PRN/intermittently;Other (Comment) (spouse works 2 to 3 days a week during the day.) Type of  Home: House Home Access: Stairs to enter [employer] of Steps: 5 Entrance Stairs-Rails: Can reach both Home Layout: Two level;1/2 bath on main level;Bed/bath upstairs Alternate Level Stairs-Number of Steps: flight Alternate Level Stairs-Rails: Right Bathroom Shower/Tub: Occupational psychologist: Handicapped height     Home Equipment: [place] - single point;Wheelchair - manual          Prior Functioning/Environment Level of Independence: Independent with assistive device(s)        Comments: Generally walks without AD in the home, uses scooter at grocery story, and will push a wheelchair at [facility].  No assist with ADL, IADL.        OT Problem List: Impaired balance (sitting and/or standing);Decreased activity tolerance      OT Treatment/Interventions:       OT Goals(Current goals can be found in the care plan section) Acute Rehab OT Goals Patient Stated Goal: Get back to my baseline for O2 use OT Goal Formulation: With patient Time For Goal Achievement: 03/10/21 Potential to Achieve Goals: Good  OT Frequency:     Barriers to D/C:  none noted          Co-evaluation              AM-PAC OT "6 Clicks" Daily Activity     Outcome Measure Help from another person eating meals?: None Help from another person taking care of personal grooming?: None Help from another person toileting, which includes using toliet, bedpan, or urinal?: None Help from another person bathing (including washing, rinsing, drying)?: None Help from another person to put on and taking off regular upper body clothing?: None Help from another person to put on and taking off regular lower body clothing?: None 6 Click Score: 24   End of Session Equipment Utilized During Treatment: Oxygen Nurse Communication: Mobility status  Activity Tolerance: Patient tolerated treatment well Patient left: in bed;with call bell/phone within reach  OT Visit Diagnosis: Unsteadiness on feet (R26.81)                Time: 1338-1400 OT Time Calculation (min): 22 min Charges:  OT General Charges $OT Visit: 1 Visit OT Evaluation $OT Eval Moderate Complexity: 1 Mod  03/10/2021  Rich, OTR/L  Acute Rehabilitation Services  Office:  [phone]   Metta Clines 03/10/2021, 2:18 PM

## 2021-03-10 NOTE — Evaluation (Signed)
Physical Therapy Evaluation Patient Details Name: [name] MRN: [id number] DOB: [birth year] Today's Date: 03/10/2021   History of Present Illness  75 y.o. 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  Patient presented due to SOB for the past 2 to 3 weeks.  Associated purulent cough that is clear.  Clinical Impression  Pt admitted with/for SOB with need for more O2 than baseline.  Pt needing guard assist for longer distance gait at this time.  SpO2 maintained in the low 90's on 3L [place] during gait, but pt in some distress and was coached on pursed-lipped breathing..  Pt currently limited functionally due to the problems listed below.  (see problems list.)  Pt will benefit from PT to maximize function and safety to be able to get home safely with available assist.     Follow Up Recommendations Home health PT;Supervision/Assistance - 24 hour;Supervision - Intermittent    Equipment Recommendations  None recommended by PT    Recommendations for Other Services       Precautions / Restrictions Precautions Precautions: Fall Precaution Comments: O2 sats Restrictions Weight Bearing Restrictions: No      Mobility  Bed Mobility Overal bed mobility: Modified Independent             General bed mobility comments: sitting EOB eating lunch    Transfers Overall transfer level: Modified independent                  Ambulation/Gait Ambulation/Gait assistance: Min guard Gait Distance (Feet): 90 Feet Assistive device: IV Pole;None Gait Pattern/deviations: Step-through  pattern   Gait velocity interpretation: <1.8 ft/sec, indicate of risk for recurrent falls General Gait Details: gait was generally steady, pt has been independently going to the bathroom, but on this longer distance, pt became dyspneic and anxious.  Sats remained in the lower 90's, HR up to 122 bpm.  Coached pt on pursed lipped breathing to help with exhaling.  Stairs            Wheelchair Mobility    Modified Rankin (Stroke Patients Only)       Balance Overall balance assessment: Mild deficits observed, not formally tested                                           Pertinent Vitals/Pain Pain Assessment: No/denies pain    Home Living Family/patient expects to be discharged to:: Private residence Living Arrangements: Spouse/significant other Available Help at Discharge: Family;Available PRN/intermittently;Other (Comment) (spouse works 2 to 3 days a week during the day.) Type of Home: House Home Access: Stairs to enter Entrance Stairs-Rails: Can reach both Entrance Stairs-Number of Steps: 5 Home Layout: Two level;1/2 bath on main level;Bed/bath upstairs Home Equipment: [place] - single point;Wheelchair - manual      Prior Function Level of Independence: Independent with assistive device(s)         Comments: Generally walks without AD in the home, uses scooter at grocery story, and will push a wheelchair at [facility].  No assist with ADL, IADL.  Hand Dominance   Dominant Hand: Right    Extremity/Trunk Assessment   Upper Extremity Assessment Upper Extremity Assessment: Overall WFL for tasks assessed    Lower Extremity Assessment Lower Extremity Assessment: Overall WFL for tasks assessed    Cervical / Trunk Assessment Cervical / Trunk Assessment: Normal  Communication   Communication: HOH  Cognition Arousal/Alertness: Awake/alert Behavior During Therapy: WFL for tasks assessed/performed Overall Cognitive Status: Within Functional Limits for  tasks assessed                                        General Comments      Exercises     Assessment/Plan    PT Assessment Patient needs continued PT services  PT Problem List Decreased activity tolerance;Decreased mobility;Cardiopulmonary status limiting activity       PT Treatment Interventions Gait training;Functional mobility training;Therapeutic activities;Patient/family education    PT Goals (Current goals can be found in the Care Plan section)  Acute Rehab PT Goals Patient Stated Goal: Get back to my baseline for O2 use PT Goal Formulation: With patient Time For Goal Achievement: 03/17/21 Potential to Achieve Goals: Good    Frequency Min 3X/week   Barriers to discharge        Co-evaluation               AM-PAC PT "6 Clicks" Mobility  Outcome Measure Help needed turning from your back to your side while in a flat bed without using bedrails?: None Help needed moving from lying on your back to sitting on the side of a flat bed without using bedrails?: None Help needed moving to and from a bed to a chair (including a wheelchair)?: None Help needed standing up from a chair using your arms (e.g., wheelchair or bedside chair)?: None Help needed to walk in hospital room?: A Little Help needed climbing 3-5 steps with a railing? : A Little 6 Click Score: 22    End of Session Equipment Utilized During Treatment: Oxygen Activity Tolerance: Patient tolerated treatment well;Patient limited by fatigue Patient left: in bed;with call bell/phone within reach Nurse Communication: Mobility status PT Visit Diagnosis: Unsteadiness on feet (R26.81);Difficulty in walking, not elsewhere classified (R26.2)    Time: 0086-7619 PT Time Calculation (min) (ACUTE ONLY): 25 min   Charges:   PT Evaluation $PT Eval Moderate Complexity: 1 Mod PT Treatments $Gait Training: 8-22 mins        03/10/2021  Ginger Carne., PT Acute Rehabilitation Services [phone]   (pager) [phone]  (office)  [name] 03/10/2021, 2:46 PM

## 2021-03-10 NOTE — Progress Notes (Signed)
PROGRESS NOTE  [name]  JGG:[id number] DOB: 10/23/1945 DOA: 03/09/2021 PCP: [place], [name], MD  Outpatient Specialists: Pulmonary, Dr. Chase Caller Brief Narrative: [name] is a 75 y.o. 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 She called EMS, was put on CPAP en route and improved in the ED with steroids, nebs, and magnesium. CXR showed no acute changes, ABG revealed hypoxia with normal pH and pCO2. hsTn mildly elevated (18, 27) and BNP normal at 58.7. Steroids, antibiotics, and nebulized treatments were started and the patient was admitted with RV panel pending.   Assessment & Plan: Principal Problem:   Acute on chronic respiratory failure with hypoxia (HCC) Active Problems:   Hypothyroidism   Hyperlipidemia   Essential hypertension   COPD, frequent exacerbations (HCC)   GERD   CAD (coronary artery disease)   COPD with acute exacerbation (HCC)   HFrEF (heart failure with reduced ejection fraction) (HCC)  Acute on chronic hypoxic respiratory failure due to COPD exacerbation on GOLD III COPD: Most recent admission for COPD was Dec 2021, never required intubation. Also with history of wedge resection of right lung nodule.  - Continue supplemental oxygen with plans to wean to 2L as respiratory status improves.  - Continue steroids - On abx. Check PCT. Unfortunately didn't get sputum collection, though will order now.  - Continue scheduled and prn  nebulized treatments - Continue IS, FV, OOB - Reordered respiratory virus panel  Chronic HFrEF: Appears euvolemic - Continue home lasix, K supplement. Not on BB presumably due to pulmonary disease. Don't see ACE/ARB/ARNI on med or allergy lists. Will investigate further.  Steroid-induced hyperglycemia:  - Start SSI, check HbA1c.   Tobacco use:  - Cessation counseling provided  PAD: Severe with mesenteric vascular disease, hx stenting of left subclavian artery occlusion, carotid stenosis not requiring intervention.  - Continue ASA, plavix, pletal, statin  HLD:  - Continue statin  GERD:  - Continue PPI   Hypothyroidism: Last TSH 0.433.  - Continue synthroid  DVT prophylaxis: Lovenox 40mg  Code Status: Full Family Communication: None at bedside Disposition Plan:  Status is: Inpatient  Remains inpatient appropriate because:Inpatient level of care appropriate due to severity of illness  Dispo: The patient is from: Home              Anticipated d/c is to: Home              Patient currently is not medically stable to d/c.   Difficult to place patient No  Consultants:   None  Procedures:   None  Antimicrobials:  Levaquin   Subjective: Breathing much better than yesterday, still severely wheezing and coughing.   Objective: Vitals:   03/10/21 0656 03/10/21 0749 03/10/21 1030 03/10/21 1318  BP: (!) 144/70  132/68   Pulse: 90     Resp: 20     Temp: 98.5 F (36.9 C)  98 F (36.7 C)   TempSrc: Oral  Oral   SpO2: 92% 96%  97%  Weight:  Height:        Intake/Output Summary (Last 24 hours) at 03/10/2021 1411 Last data filed at 03/10/2021 1200 Gross per 24 hour  Intake 848.9 ml  Output --  Net 848.9 ml   Filed Weights   03/09/21 1124 03/09/21 2145  Weight: 59 kg 58.1 kg   Gen: 75 y.o. female in no distress Pulm: Non-labored breathing tachypnea with panexpiratory wheezing.  CV: Regular rate and rhythm. No murmur, rub, or gallop. No JVD, no pitting pedal  edema. GI: Abdomen soft, non-tender, non-distended, with normoactive bowel sounds. No organomegaly or masses felt. Ext: Warm, no deformities Skin: No wounds or ulcers. There are scattered areas of hyperpigmentation said to be eczema for which she takes dupixent. Neuro: Alert and oriented. No focal neurological deficits. Psych: Judgement and insight appear normal. Mood & affect appropriate.   Data Reviewed: I have personally reviewed following labs and imaging studies  CBC: Recent Labs  Lab 03/09/21 1119 03/10/21 0026  WBC 14.7* 8.9  NEUTROABS 11.1*  --   HGB 12.1 10.6*  HCT 37.3 32.6*  MCV 87.8 86.7  PLT 619* 654*   Basic Metabolic Panel: Recent Labs  Lab 03/09/21 1119 03/10/21 0026  NA 135 133*  K 3.5 3.8  CL 102 101  CO2 25 22  GLUCOSE 111* 227*  BUN 7* 15  CREATININE 0.79 0.70  CALCIUM 8.3* 8.8*   GFR: Estimated Creatinine Clearance: 54.9 mL/min (by C-G formula based on SCr of 0.7 mg/dL). Liver Function Tests: Recent Labs  Lab 03/09/21 1119  AST 29  ALT 28  ALKPHOS 112  BILITOT 0.9  PROT 7.5  ALBUMIN 3.0*   No results for input(s): LIPASE, AMYLASE in the last 168 hours. No results for input(s): AMMONIA in the last 168 hours. Coagulation Profile: No results for input(s): INR, PROTIME in the last 168 hours. Cardiac Enzymes: No results for input(s): CKTOTAL, CKMB, CKMBINDEX, TROPONINI in the last 168 hours. BNP (last 3 results) No results for input(s): PROBNP in the last 8760 hours. HbA1C: No results for input(s): HGBA1C in the last 72 hours. CBG: No results for input(s): GLUCAP in the last 168 hours. Lipid Profile: No results for input(s): CHOL, HDL, LDLCALC, TRIG, CHOLHDL, LDLDIRECT in the last 72 hours. Thyroid Function Tests: No results for input(s): TSH, T4TOTAL, FREET4, T3FREE, THYROIDAB in the last 72 hours. Anemia Panel: No results for input(s): VITAMINB12, FOLATE, FERRITIN, TIBC, IRON, RETICCTPCT in the last 72 hours. Urine analysis:     Component Value Date/Time   COLORURINE STRAW (A) 05/21/2020 0749   APPEARANCEUR CLEAR 05/21/2020 0749   LABSPEC 1.012 05/21/2020 0749   LABSPEC 1.030 03/08/2009 1016   PHURINE 6.0 05/21/2020 0749   GLUCOSEU NEGATIVE 05/21/2020 0749   GLUCOSEU NEGATIVE 09/07/2009 0752   HGBUR SMALL (A) 05/21/2020 0749   HGBUR negative 11/09/2010 0958   BILIRUBINUR NEGATIVE 05/21/2020 0749   BILIRUBINUR Negative 03/08/2009 1016   KETONESUR NEGATIVE 05/21/2020 0749   PROTEINUR NEGATIVE 05/21/2020 0749   UROBILINOGEN 0.2 03/23/2013 0248   NITRITE NEGATIVE 05/21/2020 0749   LEUKOCYTESUR NEGATIVE 05/21/2020 0749   LEUKOCYTESUR Negative 03/08/2009 1016   Recent Results (from the past 240 hour(s))  Resp Panel by RT-PCR (Flu A&B, Covid) Nasopharyngeal Swab     Status: None   Collection Time: 03/09/21 11:13 AM   Specimen: Nasopharyngeal Swab; Nasopharyngeal(NP) swabs in vial transport medium  Result Value Ref Range Status   SARS Coronavirus 2 by RT PCR NEGATIVE NEGATIVE Final    Comment: (NOTE) SARS-CoV-2 target nucleic  acids are NOT DETECTED.  The SARS-CoV-2 RNA is generally detectable in upper respiratory specimens during the acute phase of infection. The lowest concentration of SARS-CoV-2 viral copies this assay can detect is 138 copies/mL. A negative result does not preclude SARS-Cov-2 infection and should not be used as the sole basis for treatment or other patient management decisions. A negative result may occur with  improper specimen collection/handling, submission of specimen other than nasopharyngeal swab, presence of viral mutation(s) within the areas targeted by this assay, and inadequate number of viral copies(<138 copies/mL). A negative result must be combined with clinical observations, patient history, and epidemiological information. The expected result is Negative.  Fact Sheet for Patients:  EntrepreneurPulse.com.au  Fact Sheet for Healthcare Providers:   IncredibleEmployment.be  This test is no t yet approved or cleared by the [place] FDA and  has been authorized for detection and/or diagnosis of SARS-CoV-2 by FDA under an Emergency Use Authorization (EUA). This EUA will remain  in effect (meaning this test can be used) for the duration of the COVID-19 declaration under Section 564(b)(1) of the Act, 21 U.S.C.section 360bbb-3(b)(1), unless the authorization is terminated  or revoked sooner.       Influenza A by PCR NEGATIVE NEGATIVE Final   Influenza B by PCR NEGATIVE NEGATIVE Final    Comment: (NOTE) The Xpert Xpress SARS-CoV-2/FLU/RSV plus assay is intended as an aid in the diagnosis of influenza from Nasopharyngeal swab specimens and should not be used as a sole basis for treatment. Nasal washings and aspirates are unacceptable for Xpert Xpress SARS-CoV-2/FLU/RSV testing.  Fact Sheet for Patients: EntrepreneurPulse.com.au  Fact Sheet for Healthcare Providers: IncredibleEmployment.be  This test is not yet approved or cleared by the [place] FDA and has been authorized for detection and/or diagnosis of SARS-CoV-2 by FDA under an Emergency Use Authorization (EUA). This EUA will remain in effect (meaning this test can be used) for the duration of the COVID-19 declaration under Section 564(b)(1) of the Act, 21 U.S.C. section 360bbb-3(b)(1), unless the authorization is terminated or revoked.  Performed at [facility], [place] [address], [place], [place] [address]       Radiology Studies: DG Chest Portable 1 View  Result Date: 03/09/2021 CLINICAL DATA:  Shortness of breath and respiratory distress EXAM: PORTABLE CHEST 1 VIEW COMPARISON:  LEs 09/15/2020 FINDINGS: Hyperinflation and chronic bronchitic markings. Postoperative changes to the right upper lung. There is no edema, air bronchogram, effusion, or pneumothorax. Normal heart size and mediastinal contours.  IMPRESSION: COPD without acute superimposed finding. Electronically Signed   By: Monte Fantasia M.D.   On: 03/09/2021 11:40    Scheduled Meds: . amitriptyline  50 mg Oral Daily  . aspirin EC  81 mg Oral Daily  . atorvastatin  80 mg Oral Daily  . cilostazol  100 mg Oral BID  . clopidogrel  75 mg Oral Daily  . enoxaparin (LOVENOX) injection  40 mg Subcutaneous QHS  . furosemide  40 mg Oral BID  . ipratropium-albuterol  3 mL Nebulization TID  . levothyroxine  112 mcg Oral QAC breakfast  . LORazepam  2 mg Oral QHS  . methylPREDNISolone (SOLU-MEDROL) injection  80 mg Intravenous Q6H   Followed by  . [START ON 03/12/2021] predniSONE  40 mg Oral Q breakfast  . montelukast  10 mg Oral QHS  . pantoprazole  40 mg Oral Daily  . PARoxetine  20 mg Oral Daily  . potassium chloride SA  20 mEq Oral TID  . tamsulosin  0.4 mg Oral Daily  . traZODone  100 mg Oral QHS  . umeclidinium-vilanterol  1 puff Inhalation Daily   Continuous Infusions: . levofloxacin (LEVAQUIN) IV Stopped (03/09/21 1900)     LOS: 1 day   Time spent: 35 minutes.  Patrecia Pour, MD [facility] [email or web address] 03/10/2021, 2:11 PM

## 2021-03-11 DIAGNOSIS — J441 Chronic obstructive pulmonary disease with (acute) exacerbation: Secondary | ICD-10-CM | POA: Diagnosis not present

## 2021-03-11 DIAGNOSIS — I25119 Atherosclerotic heart disease of native coronary artery with unspecified angina pectoris: Secondary | ICD-10-CM | POA: Diagnosis not present

## 2021-03-11 DIAGNOSIS — K219 Gastro-esophageal reflux disease without esophagitis: Secondary | ICD-10-CM | POA: Diagnosis not present

## 2021-03-11 DIAGNOSIS — J9621 Acute and chronic respiratory failure with hypoxia: Secondary | ICD-10-CM | POA: Diagnosis not present

## 2021-03-11 LAB — EXPECTORATED SPUTUM ASSESSMENT W GRAM STAIN, RFLX TO RESP C

## 2021-03-11 LAB — PROCALCITONIN: Procalcitonin: <0.1 ng/mL

## 2021-03-11 LAB — GLUCOSE, CAPILLARY
Glucose-Capillary: 108 mg/dL — ABNORMAL HIGH (ref 70–99)
Glucose-Capillary: 95 mg/dL (ref 70–99)
Glucose-Capillary: 166 mg/dL — ABNORMAL HIGH (ref 70–99)
Glucose-Capillary: 132 mg/dL — ABNORMAL HIGH (ref 70–99)

## 2021-03-11 LAB — HEMOGLOBIN A1C
Hgb A1c MFr Bld: 5.8 % — ABNORMAL HIGH (ref 4.8–5.6)
Mean Plasma Glucose: 120 mg/dL

## 2021-03-11 MED ORDER — FUROSEMIDE 10 MG/ML IJ SOLN
40.0000 mg | Freq: Once | INTRAMUSCULAR | Status: AC
  Administered 2021-03-11: 40 mg via INTRAVENOUS
  Filled 2021-03-11: qty 4

## 2021-03-11 MED ORDER — FUROSEMIDE 40 MG PO TABS
40.0000 mg | ORAL_TABLET | Freq: Two times a day (BID) | ORAL | Status: DC
  Administered 2021-03-12: 40 mg via ORAL
  Filled 2021-03-11: qty 1

## 2021-03-11 MED ORDER — AZITHROMYCIN 250 MG PO TABS
250.0000 mg | ORAL_TABLET | Freq: Every day | ORAL | Status: DC
  Administered 2021-03-11: 250 mg via ORAL
  Filled 2021-03-11: qty 1

## 2021-03-11 NOTE — Progress Notes (Signed)
PROGRESS NOTE  [name]  WUJ:[id number] DOB: 28-Mar-1946 DOA: 03/09/2021 PCP: [place], [name], MD  Outpatient Specialists: Pulmonary, Dr. Chase Caller Brief Narrative: [name] is a 75 y.o. 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 She called EMS, was put on CPAP en route and improved in the ED with steroids, nebs, and magnesium. CXR showed no acute changes, ABG revealed hypoxia with normal pH and pCO2. hsTn mildly elevated (18, 27) and BNP normal at 58.7. RVP negative. Steroids, antibiotics, and nebulized treatments were started and the patient was admitted. Respiratory distress has resolved, though the patient feels no better from admission.  Assessment & Plan: Principal Problem:   Acute on chronic respiratory failure with hypoxia (HCC) Active Problems:   Hypothyroidism   Hyperlipidemia   Essential hypertension   COPD, frequent exacerbations (HCC)   GERD   CAD (coronary artery disease)   COPD with acute exacerbation (HCC)   HFrEF (heart failure with reduced ejection fraction) (HCC)  Acute on chronic hypoxic respiratory failure due to COPD exacerbation on GOLD III COPD: Most recent admission for COPD was Dec 2021, never required intubation. Also with history of wedge resection of right lung nodule.  - Continue supplemental oxygen with plans to wean to 2L as respiratory status improves.  - Continue steroids - Will ask for pulmonary consult if still not  improving over next 24 hours. Hypoxia improved. Suspect upper airway wheezing predominantly at this time.  - RVP negative, PCT negative > deescalate levaquin to azithromycin.  - Continue scheduled and prn nebulized treatments - Continue IS, FV, OOB  Chronic HFrEF: Appears euvolemic - Continue home lasix, K supplement. Not on BB presumably due to pulmonary disease. Don't see ACE/ARB/ARNI on med or allergy lists.  - Give additional dose of lasix this PM as pt reports some swelling  Steroid-induced hyperglycemia on prediabetes: HbA1c 5.8%. - Start SSI  Tobacco use:  - Cessation counseling provided  PAD: Severe with mesenteric vascular disease, hx stenting of left subclavian artery occlusion, carotid stenosis not requiring intervention.  - Continue ASA, plavix, pletal, statin  HLD:  - Continue statin  GERD:  - Continue PPI   Hypothyroidism: Last TSH 0.433.  - Continue synthroid  DVT prophylaxis: Lovenox 40mg  Code Status: Full Family Communication: None at bedside Disposition Plan:  Status is: Inpatient  Remains inpatient appropriate because:Inpatient level of care appropriate due to severity of illness  Dispo: The patient is from: Home              Anticipated d/c is to: Home              Patient currently is not medically stable to d/c.   Difficult to place patient No  Consultants:   None  Procedures:   None  Antimicrobials:  Levaquin > azithromycin  Subjective: Reports still wheezing, severely coughing with dyspnea at rest and exertion limiting mobility. Feels her abdomen is starting to swell which is where her swelling usually is, not in the legs. No chest pain reported.  Objective: Vitals:   03/11/21 0458 03/11/21 0746 03/11/21 0814 03/11/21 1139  BP: 123/66 (!) 157/73  121/74  Pulse: 86 88  88  Resp: 16 18  18   Temp: 97.9 F (36.6 C) 98.2 F (36.8 C)  98.4 F (36.9 C)  TempSrc: Oral Oral  Oral  SpO2: 95% 95% 99% 92%  Weight:      Height:         Intake/Output Summary (Last 24 hours) at 03/11/2021 1415 Last data filed at 03/11/2021 9449 Gross per 24 hour  Intake 300 ml  Output --  Net 300 ml   Filed Weights   03/09/21 1124 03/09/21 2145  Weight: 59 kg 58.1 kg   Gen: 75 y.o. female in no distress Pulm: Nonlabored breathing when sleeping as I enter the room. Severe upper airway wheezing and frequent coughing initiated when awoken, difficult to tell but lower wheezing seems improved. CV: Regular rate and rhythm. No murmur, rub, or gallop. No JVD, no dependent edema. GI: Abdomen soft, non-tender, +ventral hernia nontender, non-distended, with normoactive bowel sounds.  Ext: Warm, no deformities Skin: No new rashes, lesions or ulcers on visualized skin. Neuro: Alert and oriented. Stable HOH. No focal neurological deficits. Psych: Judgement and insight appear fair. Mood euthymic & affect congruent. Behavior is appropriate.    Data Reviewed: I have personally reviewed following labs and imaging studies  CBC: Recent Labs  Lab 03/09/21 1119 03/10/21 0026  WBC 14.7* 8.9  NEUTROABS 11.1*  --   HGB 12.1 10.6*  HCT 37.3 32.6*  MCV 87.8 86.7  PLT 619* 675*   Basic Metabolic Panel: Recent Labs  Lab 03/09/21 1119 03/10/21 0026  NA 135 133*  K 3.5 3.8  CL 102 101  CO2 25 22  GLUCOSE 111* 227*  BUN 7* 15  CREATININE 0.79 0.70  CALCIUM 8.3* 8.8*   GFR: Estimated Creatinine Clearance: 54.9 mL/min (by C-G formula based on SCr of 0.7 mg/dL). Liver Function Tests: Recent Labs  Lab 03/09/21 1119  AST 29  ALT 28  ALKPHOS 112  BILITOT 0.9  PROT 7.5  ALBUMIN 3.0*   No results for input(s): LIPASE, AMYLASE in the last 168 hours. No results for input(s): AMMONIA in the last 168 hours. Coagulation Profile: No results for input(s): INR, PROTIME in the last 168 hours. Cardiac Enzymes: No results for input(s): CKTOTAL, CKMB, CKMBINDEX, TROPONINI in the last 168 hours. BNP (last 3 results) No results for input(s): PROBNP  in the last 8760 hours. HbA1C: Recent Labs    03/10/21 1500  HGBA1C 5.8*   CBG: Recent Labs  Lab 03/10/21 1722 03/10/21 2126 03/11/21 0744 03/11/21 1135  GLUCAP 151* 137* 108* 95   Lipid Profile: No results for input(s): CHOL, HDL, LDLCALC, TRIG, CHOLHDL, LDLDIRECT in the last 72 hours. Thyroid Function Tests: No results for input(s): TSH, T4TOTAL, FREET4, T3FREE, THYROIDAB in the last 72 hours. Anemia Panel: No results for input(s): VITAMINB12, FOLATE, FERRITIN, TIBC, IRON, RETICCTPCT in the last 72 hours. Urine analysis:    Component Value Date/Time   COLORURINE STRAW (A) 05/21/2020 [place] 05/21/2020 0749   LABSPEC 1.012 05/21/2020 0749   LABSPEC 1.030 03/08/2009 1016   PHURINE 6.0 05/21/2020 0749   GLUCOSEU NEGATIVE 05/21/2020 0749   GLUCOSEU NEGATIVE 09/07/2009 0752   HGBUR SMALL (A) 05/21/2020 0749   HGBUR negative 11/09/2010 [place] NEGATIVE 05/21/2020 0749   BILIRUBINUR Negative 03/08/2009 [place] 05/21/2020 0749   PROTEINUR NEGATIVE 05/21/2020 0749  UROBILINOGEN 0.2 03/23/2013 0248   NITRITE NEGATIVE 05/21/2020 0749   LEUKOCYTESUR NEGATIVE 05/21/2020 0749   LEUKOCYTESUR Negative 03/08/2009 1016   Recent Results (from the past 240 hour(s))  Resp Panel by RT-PCR (Flu A&B, Covid) Nasopharyngeal Swab     Status: None   Collection Time: 03/09/21 11:13 AM   Specimen: Nasopharyngeal Swab; Nasopharyngeal(NP) swabs in vial transport medium  Result Value Ref Range Status   SARS Coronavirus 2 by RT PCR NEGATIVE NEGATIVE Final    Comment: (NOTE) SARS-CoV-2 target nucleic acids are NOT DETECTED.  The SARS-CoV-2 RNA is generally detectable in upper respiratory specimens during the acute phase of infection. The lowest concentration of SARS-CoV-2 viral copies this assay can detect is 138 copies/mL. A negative result does not preclude SARS-Cov-2 infection and should not be used as the sole basis for treatment or other  patient management decisions. A negative result may occur with  improper specimen collection/handling, submission of specimen other than nasopharyngeal swab, presence of viral mutation(s) within the areas targeted by this assay, and inadequate number of viral copies(<138 copies/mL). A negative result must be combined with clinical observations, patient history, and epidemiological information. The expected result is Negative.  Fact Sheet for Patients:  EntrepreneurPulse.com.au  Fact Sheet for Healthcare Providers:  IncredibleEmployment.be  This test is no t yet approved or cleared by the [place] FDA and  has been authorized for detection and/or diagnosis of SARS-CoV-2 by FDA under an Emergency Use Authorization (EUA). This EUA will remain  in effect (meaning this test can be used) for the duration of the COVID-19 declaration under Section 564(b)(1) of the Act, 21 U.S.C.section 360bbb-3(b)(1), unless the authorization is terminated  or revoked sooner.       Influenza A by PCR NEGATIVE NEGATIVE Final   Influenza B by PCR NEGATIVE NEGATIVE Final    Comment: (NOTE) The Xpert Xpress SARS-CoV-2/FLU/RSV plus assay is intended as an aid in the diagnosis of influenza from Nasopharyngeal swab specimens and should not be used as a sole basis for treatment. Nasal washings and aspirates are unacceptable for Xpert Xpress SARS-CoV-2/FLU/RSV testing.  Fact Sheet for Patients: EntrepreneurPulse.com.au  Fact Sheet for Healthcare Providers: IncredibleEmployment.be  This test is not yet approved or cleared by the [place] FDA and has been authorized for detection and/or diagnosis of SARS-CoV-2 by FDA under an Emergency Use Authorization (EUA). This EUA will remain in effect (meaning this test can be used) for the duration of the COVID-19 declaration under Section 564(b)(1) of the Act, 21 U.S.C. section  360bbb-3(b)(1), unless the authorization is terminated or revoked.  Performed at [facility], [place] [address] [place], [place] [address]   Respiratory (~20 pathogens) panel by PCR     Status: None   Collection Time: 03/10/21  2:21 PM   Specimen: Nasopharyngeal Swab; Respiratory  Result Value Ref Range Status   Adenovirus NOT DETECTED NOT DETECTED Final   Coronavirus 229E NOT DETECTED NOT DETECTED Final    Comment: (NOTE) The Coronavirus on the Respiratory Panel, DOES NOT test for the novel  Coronavirus (2019 nCoV)    Coronavirus HKU1 NOT DETECTED NOT DETECTED Final   Coronavirus NL63 NOT DETECTED NOT DETECTED Final   Coronavirus OC43 NOT DETECTED NOT DETECTED Final   Metapneumovirus NOT DETECTED NOT DETECTED Final   Rhinovirus / Enterovirus NOT DETECTED NOT DETECTED Final   Influenza A NOT DETECTED NOT DETECTED Final   Influenza B NOT DETECTED NOT DETECTED Final   Parainfluenza Virus 1 NOT DETECTED NOT  DETECTED Final   Parainfluenza Virus 2 NOT DETECTED NOT DETECTED Final   Parainfluenza Virus 3 NOT DETECTED NOT DETECTED Final   Parainfluenza Virus 4 NOT DETECTED NOT DETECTED Final   Respiratory Syncytial Virus NOT DETECTED NOT DETECTED Final   Bordetella pertussis NOT DETECTED NOT DETECTED Final   Bordetella Parapertussis NOT DETECTED NOT DETECTED Final   Chlamydophila pneumoniae NOT DETECTED NOT DETECTED Final   Mycoplasma pneumoniae NOT DETECTED NOT DETECTED Final    Comment: Performed at [facility], [place] [address] [place], [place] [address]      Radiology Studies: No results found.  Scheduled Meds: . amitriptyline  50 mg Oral Daily  . aspirin EC  81 mg Oral Daily  . atorvastatin  80 mg Oral Daily  . azithromycin  250 mg Oral Daily  . cilostazol  100 mg Oral BID  . clopidogrel  75 mg Oral Daily  . enoxaparin (LOVENOX) injection  40 mg Subcutaneous QHS  . feeding supplement  237 mL Oral BID BM  . furosemide  40 mg Oral BID  . insulin aspart  0-5 Units  Subcutaneous QHS  . insulin aspart  0-9 Units Subcutaneous TID WC  . ipratropium-albuterol  3 mL Nebulization TID  . levothyroxine  112 mcg Oral QAC breakfast  . LORazepam  2 mg Oral QHS  . methylPREDNISolone (SOLU-MEDROL) injection  60 mg Intravenous BID  . montelukast  10 mg Oral QHS  . multivitamin with minerals  1 tablet Oral Daily  . pantoprazole  40 mg Oral Daily  . PARoxetine  20 mg Oral Daily  . potassium chloride SA  20 mEq Oral TID  . tamsulosin  0.4 mg Oral Daily  . traZODone  100 mg Oral QHS  . umeclidinium-vilanterol  1 puff Inhalation Daily   Continuous Infusions:    LOS: 2 days   Time spent: 25 minutes.  Patrecia Pour, MD [facility] [email or web address] 03/11/2021, 2:15 PM

## 2021-03-12 DIAGNOSIS — I25119 Atherosclerotic heart disease of native coronary artery with unspecified angina pectoris: Secondary | ICD-10-CM | POA: Diagnosis not present

## 2021-03-12 DIAGNOSIS — K219 Gastro-esophageal reflux disease without esophagitis: Secondary | ICD-10-CM | POA: Diagnosis not present

## 2021-03-12 DIAGNOSIS — J441 Chronic obstructive pulmonary disease with (acute) exacerbation: Secondary | ICD-10-CM | POA: Diagnosis not present

## 2021-03-12 DIAGNOSIS — J9621 Acute and chronic respiratory failure with hypoxia: Secondary | ICD-10-CM | POA: Diagnosis not present

## 2021-03-12 LAB — GLUCOSE, CAPILLARY: Glucose-Capillary: 134 mg/dL — ABNORMAL HIGH (ref 70–99)

## 2021-03-12 MED ORDER — PREDNISONE 10 MG PO TABS: ORAL_TABLET | ORAL | 0 refills | Status: AC

## 2021-03-12 MED ORDER — AZITHROMYCIN 250 MG PO TABS: 250.0000 mg | ORAL_TABLET | Freq: Every day | ORAL | 0 refills | Status: AC

## 2021-03-12 NOTE — Discharge Summary (Addendum)
Physician Discharge Summary  [name] FTD:[id number] DOB: 1946/03/23 DOA: 03/09/2021  PCP: Martinique, [name], MD  Admit date: 03/09/2021 Discharge date: 03/12/2021  Admitted From: Home Disposition: Home   Recommendations for Outpatient Follow-up:  1. Follow up with pulmonary, Dr. Chase Caller, after discharge.   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 She called EMS, was put on CPAP en route and improved in the ED with steroids, nebs, and magnesium. CXR showed no acute changes, ABG revealed hypoxia with normal pH and pCO2. hsTn mildly elevated (18, 27) and BNP normal at 58.7. RVP negative. Steroids, antibiotics, and nebulized treatments were started and the patient was admitted. Respiratory distress has resolved, and hypoxia has returned to baseline levels.   Discharge Diagnoses:  Principal Problem:   Acute on chronic respiratory failure with hypoxia (HCC) Active Problems:   Hypothyroidism   Hyperlipidemia   Essential hypertension   COPD, frequent exacerbations (HCC)   GERD   CAD (coronary artery disease)   COPD with acute exacerbation (HCC)   Chronic heart failure with preserved ejection fraction (HFpEF) (HCC)  Acute on chronic hypoxic respiratory failure due to COPD  exacerbation on GOLD III COPD: Most recent admission for COPD was Dec 2021, never required intubation. Also with history of wedge resection of right lung nodule.  - Continue home 2L. Respiratory effort has normalized, returning very close to baseline. Able to ambulate and bathe without oxygen this morning. SpO2 89-92% once back in bed resting on 2L.  - Continue steroids at discharge - Continues to have upper airway sounds without lower wheezing for past couple days.  - RVP negative, PCT negative > deescalated levaquin to azithromycin to complete 5 days.  - Continue scheduled and prn nebulized treatments - Continue IS at home  Chronic HFpEF: Appears euvolemic. Most recent work up with [facility] by Dr. Claiborne Billings 05/25/2020 showed no significant obstructive disease with hyperdynamic LVEF 65%, LVEDP 75mmHg. HFrEF previously diagnosed, no longer accurate diagnosis. - Continue home lasix, K supplement. Not on BB presumably due to pulmonary disease.   Steroid-induced hyperglycemia on prediabetes: HbA1c 5.8%.  - Required 2 units of novolog insulin in past 24 hours, will not require Tx after DC.  Tobacco use:  - Cessation counseling provided  PAD: Severe with mesenteric vascular disease, hx stenting of left subclavian artery occlusion, carotid stenosis not requiring intervention.  - Continue ASA, plavix, pletal, statin  HLD:  - Continue statin  GERD:  - Continue PPI   Hypothyroidism: Last TSH 0.433.  - Continue synthroid  Discharge Instructions Discharge Instructions    Diet - low sodium heart healthy   Complete by: As directed    Discharge instructions   Complete by: As directed    You were admitted for COPD exacerbation and have improved. You are doing great and are stable for discharge home. You will need to continue taking azithromycin in e the  morning for 2 more days and continue taking prednisone every morning in the taper as prescribed. This will be 50mg  the first day, then down by 10mg   each day until you get to 10mg  daily which you will continue for 3 days. So 50mg  tomorrow, 40mg  the next day, 30mg  the next day, 20mg  the next day, and 10 mg daily for the next 3 days, then off.   Please follow up with your pulmonologist in the next 1-2 weeks or seek medical attention sooner if your symptoms begin to worsen instead of continuing to improve.   It was a pleasure meeting you, please take care! - Dr. Bonner Puna     Allergies as of 03/12/2021      Reactions   Sulfa Antibiotics Swelling      Medication List    STOP taking these medications   acetaminophen 325 MG tablet Commonly known as: TYLENOL   doxycycline 100 MG tablet Commonly known as: VIBRA-TABS     TAKE these medications   albuterol 108 (90 Base) MCG/ACT inhaler Commonly known as: VENTOLIN HFA TAKE 2 PUFFS BY MOUTH EVERY 6 HOURS AS NEEDED FOR WHEEZE OR SHORTNESS OF BREATH What changed: See the new instructions.   amitriptyline 50 MG tablet Commonly known as: ELAVIL Take 50 mg by mouth daily.   aspirin EC 81 MG tablet Take 1 tablet (81 mg total) by mouth daily.   atorvastatin 80 MG tablet Commonly known as: LIPITOR TAKE 1 TABLET BY MOUTH EVERY DAY   azithromycin 250 MG tablet Commonly known as: ZITHROMAX Take 1 tablet (250 mg total) by mouth daily for 2 days.   benzonatate 100 MG capsule Commonly known as: Tessalon Perles Take 1 capsule (100 mg total) by mouth 3 (three) times daily as needed for cough.   Biotin 10000 MCG Tabs Take 10,000 mcg by mouth daily.   budesonide 0.5 MG/2ML nebulizer solution Commonly known as: PULMICORT Take 2 mLs (0.5 mg total) by nebulization 2 (two) times daily.   cilostazol 100 MG tablet Commonly known as: PLETAL TAKE 1 TABLET BY MOUTH TWICE A DAY   clopidogrel 75 MG tablet Commonly known as: Plavix Take 1 tablet (75 mg total) by mouth daily.   Dupixent 300 MG/2ML Sopn Generic drug: Dupilumab Inject 2 mLs into the skin every 14 (fourteen) days.   fluticasone  50 MCG/ACT nasal spray Commonly known as: FLONASE SPRAY 1 SPRAY INTO EACH NOSTRIL TWICE A DAY What changed: See the new instructions.   furosemide 40 MG tablet Commonly known as: LASIX TAKE 1 TABLET BY MOUTH TWICE A DAY What changed: when to take this   ibuprofen 200 MG tablet Commonly known as: ADVIL Take 200 mg by mouth every 6 (six) hours as needed for mild pain.   ipratropium-albuterol 0.5-2.5 (3) MG/3ML Soln Commonly known as: DUONEB Inhale 3 mLs into the lungs every 6 (six) hours as needed (sob, wheezing).   Klor-Con M20 20 MEQ tablet Generic drug: potassium chloride SA TAKE 1 TABLET BY MOUTH THREE TIMES A DAY What changed: how much to take   levothyroxine 112 MCG tablet Commonly known as: SYNTHROID TAKE 1 TABLET BY MOUTH EVERY DAY IN THE MORNING BEFORE BREAKFAST What changed: See the new instructions.   LORazepam 2 MG tablet Commonly known as: ATIVAN TAKE 1 TABLET BY MOUTH AT BEDTIME.   montelukast 10 MG tablet Commonly known as: SINGULAIR Take 1 tablet (10 mg total) by mouth at bedtime.   pantoprazole 40 MG tablet Commonly known as: PROTONIX TAKE 1  TABLET BY MOUTH EVERY DAY   PARoxetine 20 MG tablet Commonly known as: PAXIL TAKE 1 TABLET BY MOUTH EVERYDAY AT BEDTIME What changed: See the new instructions.   predniSONE 10 MG tablet Commonly known as: DELTASONE Take 5 tablets (50 mg total) by mouth daily with breakfast for 1 day, THEN 4 tablets (40 mg total) daily with breakfast for 1 day, THEN 3 tablets (30 mg total) daily with breakfast for 1 day, THEN 2 tablets (20 mg total) daily with breakfast for 1 day, THEN 1 tablet (10 mg total) daily with breakfast for 3 days. Start taking on: Mar 12, 2021 What changed: See the new instructions.   tamsulosin 0.4 MG Caps capsule Commonly known as: FLOMAX TAKE 1 CAPSULE BY MOUTH EVERY DAY   traZODone 100 MG tablet Commonly known as: DESYREL TAKE 1 TABLET BY MOUTH EVERYDAY AT BEDTIME What changed: See the new  instructions.   Trelegy Ellipta 200-62.5-25 MCG/INH Aepb Generic drug: Fluticasone-Umeclidin-Vilant TAKE 1 PUFF BY MOUTH EVERY DAY What changed: See the new instructions.       Follow-up Information    Martinique, [name], MD Follow up.   Specialty: Family Medicine Contact information: [place] [address] [phone]        Sueanne Margarita, MD .   Specialty: Cardiology Contact information: [phone] [address] [place] [address] [phone]        Brand Males, MD Follow up.   Specialty: Pulmonary Disease Contact information: [place] [address] [phone]              Allergies  Allergen Reactions  . Sulfa Antibiotics Swelling    Consultations:  None  Procedures/Studies: DG Chest Portable 1 View  Result Date: 03/09/2021 CLINICAL DATA:  Shortness of breath and respiratory distress EXAM: PORTABLE CHEST 1 VIEW COMPARISON:  LEs 09/15/2020 FINDINGS: Hyperinflation and chronic bronchitic markings. Postoperative changes to the right upper lung. There is no edema, air bronchogram, effusion, or pneumothorax. Normal heart size and mediastinal contours. IMPRESSION: COPD without acute superimposed finding. Electronically Signed   By: Monte Fantasia M.D.   On: 03/09/2021 11:40    Subjective: Feels much better. Still with cough though this has improved. No shortness of breath at rest, and minimal dyspnea even when walking to/from bathroom without oxygen. No chest pain reported. Feels stable for discharge.   Discharge Exam: Vitals:   03/12/21 0751 03/12/21 0754  BP:    Pulse:    Resp:    Temp:    SpO2: 94% 94%   General: Pt is alert, awake, not in acute distress Cardiovascular: RRR, S1/S2 +, no rubs, no gallops Respiratory: Nonlabored, upper airway wheezing with deep breathing that resolves with normal breathing and no lower wheezing or crackles noted.  Abdominal: Soft, NT, ND, bowel sounds  + Extremities: No edema, no cyanosis  Labs: BNP (last 3 results) Recent Labs    05/20/20 1657 09/10/20 0628 03/09/21 1119  BNP 99.7 145.4* 12.8   Basic Metabolic Panel: Recent Labs  Lab 03/09/21 1119 03/10/21 0026  NA 135 133*  K 3.5 3.8  CL 102 101  CO2 25 22  GLUCOSE 111* 227*  BUN 7* 15  CREATININE 0.79 0.70  CALCIUM 8.3* 8.8*   Liver Function Tests: Recent Labs  Lab 03/09/21 1119  AST 29  ALT 28  ALKPHOS 112  BILITOT 0.9  PROT 7.5  ALBUMIN 3.0*   No results for input(s): LIPASE, AMYLASE in  the last 168 hours. No results for input(s): AMMONIA in the last 168 hours. CBC: Recent Labs  Lab 03/09/21 1119 03/10/21 0026  WBC 14.7* 8.9  NEUTROABS 11.1*  --   HGB 12.1 10.6*  HCT 37.3 32.6*  MCV 87.8 86.7  PLT 619* 595*   Cardiac Enzymes: No results for input(s): CKTOTAL, CKMB, CKMBINDEX, TROPONINI in the last 168 hours. BNP: Invalid input(s): POCBNP CBG: Recent Labs  Lab 03/11/21 0744 03/11/21 1135 03/11/21 1634 03/11/21 2043 03/12/21 0717  GLUCAP 108* 95 166* 132* 134*   D-Dimer No results for input(s): DDIMER in the last 72 hours. Hgb A1c Recent Labs    03/10/21 1500  HGBA1C 5.8*   Lipid Profile No results for input(s): CHOL, HDL, LDLCALC, TRIG, CHOLHDL, LDLDIRECT in the last 72 hours. Thyroid function studies No results for input(s): TSH, T4TOTAL, T3FREE, THYROIDAB in the last 72 hours.  Invalid input(s): FREET3 Anemia work up No results for input(s): VITAMINB12, FOLATE, FERRITIN, TIBC, IRON, RETICCTPCT in the last 72 hours. Urinalysis    Component Value Date/Time   COLORURINE STRAW (A) 05/21/2020 0749   APPEARANCEUR CLEAR 05/21/2020 0749   LABSPEC 1.012 05/21/2020 0749   LABSPEC 1.030 03/08/2009 1016   PHURINE 6.0 05/21/2020 0749   GLUCOSEU NEGATIVE 05/21/2020 0749   GLUCOSEU NEGATIVE 09/07/2009 0752   HGBUR SMALL (A) 05/21/2020 0749   HGBUR negative 11/09/2010 0958   BILIRUBINUR NEGATIVE 05/21/2020 0749   BILIRUBINUR  Negative 03/08/2009 1016   KETONESUR NEGATIVE 05/21/2020 0749   PROTEINUR NEGATIVE 05/21/2020 0749   UROBILINOGEN 0.2 03/23/2013 0248   NITRITE NEGATIVE 05/21/2020 0749   LEUKOCYTESUR NEGATIVE 05/21/2020 0749   LEUKOCYTESUR Negative 03/08/2009 1016    Microbiology Recent Results (from the past 240 hour(s))  Resp Panel by RT-PCR (Flu A&B, Covid) Nasopharyngeal Swab     Status: None   Collection Time: 03/09/21 11:13 AM   Specimen: Nasopharyngeal Swab; Nasopharyngeal(NP) swabs in vial transport medium  Result Value Ref Range Status   SARS Coronavirus 2 by RT PCR NEGATIVE NEGATIVE Final    Comment: (NOTE) SARS-CoV-2 target nucleic acids are NOT DETECTED.  The SARS-CoV-2 RNA is generally detectable in upper respiratory specimens during the acute phase of infection. The lowest concentration of SARS-CoV-2 viral copies this assay can detect is 138 copies/mL. A negative result does not preclude SARS-Cov-2 infection and should not be used as the sole basis for treatment or other patient management decisions. A negative result may occur with  improper specimen collection/handling, submission of specimen other than nasopharyngeal swab, presence of viral mutation(s) within the areas targeted by this assay, and inadequate number of viral copies(<138 copies/mL). A negative result must be combined with clinical observations, patient history, and epidemiological information. The expected result is Negative.  Fact Sheet for Patients:  EntrepreneurPulse.com.au  Fact Sheet for Healthcare Providers:  IncredibleEmployment.be  This test is no t yet approved or cleared by the [place] FDA and  has been authorized for detection and/or diagnosis of SARS-CoV-2 by FDA under an Emergency Use Authorization (EUA). This EUA will remain  in effect (meaning this test can be used) for the duration of the COVID-19 declaration under Section 564(b)(1) of the Act,  21 U.S.C.section 360bbb-3(b)(1), unless the authorization is terminated  or revoked sooner.       Influenza A by PCR NEGATIVE NEGATIVE Final   Influenza B by PCR NEGATIVE NEGATIVE Final    Comment: (NOTE) The Xpert Xpress SARS-CoV-2/FLU/RSV plus assay is intended as an aid in the diagnosis of influenza  from Nasopharyngeal swab specimens and should not be used as a sole basis for treatment. Nasal washings and aspirates are unacceptable for Xpert Xpress SARS-CoV-2/FLU/RSV testing.  Fact Sheet for Patients: EntrepreneurPulse.com.au  Fact Sheet for Healthcare Providers: IncredibleEmployment.be  This test is not yet approved or cleared by the [place] FDA and has been authorized for detection and/or diagnosis of SARS-CoV-2 by FDA under an Emergency Use Authorization (EUA). This EUA will remain in effect (meaning this test can be used) for the duration of the COVID-19 declaration under Section 564(b)(1) of the Act, 21 U.S.C. section 360bbb-3(b)(1), unless the authorization is terminated or revoked.  Performed at [facility], [place] [address] [place], [place] [address]   Respiratory (~20 pathogens) panel by PCR     Status: None   Collection Time: 03/10/21  2:21 PM   Specimen: Nasopharyngeal Swab; Respiratory  Result Value Ref Range Status   Adenovirus NOT DETECTED NOT DETECTED Final   Coronavirus 229E NOT DETECTED NOT DETECTED Final    Comment: (NOTE) The Coronavirus on the Respiratory Panel, DOES NOT test for the novel  Coronavirus (2019 nCoV)    Coronavirus HKU1 NOT DETECTED NOT DETECTED Final   Coronavirus NL63 NOT DETECTED NOT DETECTED Final   Coronavirus OC43 NOT DETECTED NOT DETECTED Final   Metapneumovirus NOT DETECTED NOT DETECTED Final   Rhinovirus / Enterovirus NOT DETECTED NOT DETECTED Final   Influenza A NOT DETECTED NOT DETECTED Final   Influenza B NOT DETECTED NOT DETECTED Final   Parainfluenza Virus 1 NOT DETECTED  NOT DETECTED Final   Parainfluenza Virus 2 NOT DETECTED NOT DETECTED Final   Parainfluenza Virus 3 NOT DETECTED NOT DETECTED Final   Parainfluenza Virus 4 NOT DETECTED NOT DETECTED Final   Respiratory Syncytial Virus NOT DETECTED NOT DETECTED Final   Bordetella pertussis NOT DETECTED NOT DETECTED Final   Bordetella Parapertussis NOT DETECTED NOT DETECTED Final   Chlamydophila pneumoniae NOT DETECTED NOT DETECTED Final   Mycoplasma pneumoniae NOT DETECTED NOT DETECTED Final    Comment: Performed at [facility], [place]. [address], [place], [place] [address]  Expectorated Sputum Assessment w Gram Stain, Rflx to Resp Cult     Status: None   Collection Time: 03/10/21  2:29 PM   Specimen: Expectorated Sputum  Result Value Ref Range Status   Specimen Description EXPECTORATED SPUTUM  Final   Special Requests NONE  Final   Sputum evaluation   Final    Sputum specimen not acceptable for testing.  Please recollect.   RESULT CALLED TO, READ BACK BY AND VERIFIED WITH: RN [facility] ON [id number] AT 1812 BY E.PARRISH Performed at [facility], [place] [address], [place], [place] [address]    Report Status 03/11/2021 FINAL  Final    Time coordinating discharge: Approximately 40 minutes  Patrecia Pour, MD  [facility] 03/12/2021, 9:33 AM

## 2021-03-12 NOTE — Discharge Instructions (Signed)
Chronic Obstructive Pulmonary Disease Exacerbation  Chronic obstructive pulmonary disease (COPD) is a long-term (chronic) condition that affects the lungs. COPD is a general term that can be used to describe many different lung problems that cause lung swelling (inflammation) and limit airflow, including chronic bronchitis and emphysema. COPD exacerbations are episodes when breathing symptoms become much worse and require extra treatment. COPD exacerbations are usually caused by infections. Without treatment, COPD exacerbations can be severe and even life threatening. Frequent COPD exacerbations can cause further damage to the lungs. What are the causes? This condition may be caused by:  Respiratory infections, including viral and bacterial infections.  Exposure to smoke.  Exposure to air pollution, chemical fumes, or dust.  Things that give you an allergic reaction (allergens).  Not taking your usual COPD medicines as directed.  Underlying medical problems, such as congestive heart failure or infections not involving the lungs. In many cases, the cause (trigger) of this condition is not known. What increases the risk? The following factors may make you more likely to develop this condition:  Smoking cigarettes.  Old age.  Frequent prior COPD exacerbations. What are the signs or symptoms? Symptoms of this condition include:  Increased coughing.  Increased production of mucus from your lungs (sputum).  Increased wheezing.  Increased shortness of breath.  Rapid or labored breathing.  Chest tightness.  Less energy than usual.  Sleep disruption from symptoms.  Confusion or increased sleepiness. Often these symptoms happen or get worse even with the use of medicines. How is this diagnosed? This condition is diagnosed based on:  Your medical history.  A physical exam. You may also have tests, including:  A chest X-ray.  Blood tests.  Lung (pulmonary) function  tests. How is this treated? Treatment for this condition depends on the severity and cause of the symptoms. You may need to be admitted to a hospital for treatment. Some of the treatments commonly used to treat COPD exacerbations are:  Antibiotic medicines. These may be used for severe exacerbations caused by a lung infection, such as pneumonia.  Bronchodilators. These are inhaled medicines that expand the air passages and allow increased airflow.  Steroid medicines. These act to reduce inflammation in the airways. They may be given with an inhaler, taken by mouth, or given through an IV tube inserted into one of your veins.  Supplemental oxygen therapy.  Airway clearing techniques, such as noninvasive ventilation (NIV) and positive expiratory pressure (PEP). These provide respiratory support through a mask or other noninvasive device. An example of this would be using a continuous positive airway pressure (CPAP) machine to improve delivery of oxygen into your lungs. Follow these instructions at home: Medicines  Take over-the-counter and prescription medicines only as told by your health care provider. It is important to use correct technique with inhaled medicines.  If you were prescribed an antibiotic medicine or oral steroid, take it as told by your health care provider. Do not stop taking the medicine even if you start to feel better. Lifestyle  Eat a healthy diet.  Exercise regularly.  Get plenty of sleep.  Avoid exposure to all substances that irritate the airway, especially to tobacco smoke.  Wash your hands often with soap and water to reduce the risk of infection. If soap and water are not available, use hand sanitizer.  During flu season, avoid enclosed spaces that are crowded with people. General instructions  Drink enough fluid to keep your urine clear or pale yellow (unless you have a medical  condition that requires fluid restriction).  Use a cool mist vaporizer. This  humidifies the air and makes it easier for you to clear your chest when you cough.  If you have a home nebulizer and oxygen, continue to use them as told by your health care provider.  Keep all follow-up visits as told by your health care provider. This is important. How is this prevented?  Stay up-to-date on pneumococcal and influenza (flu) vaccines. A flu shot is recommended every year to help prevent exacerbations.  Do not use any products that contain nicotine or tobacco, such as cigarettes and e-cigarettes. Quitting smoking is very important in preventing COPD from getting worse and in preventing exacerbations from happening as often. If you need help quitting, ask your health care provider.  Follow all instructions for pulmonary rehabilitation after a recent exacerbation. This can help prevent future exacerbations.  Work with your health care provider to develop and follow an action plan. This tells you what steps to take when you experience certain symptoms. Contact a health care provider if:  You have a worsening of your regular COPD symptoms. Get help right away if:  You have worsening shortness of breath, even when resting.  You have trouble talking.  You have severe chest pain.  You cough up blood.  You have a fever.  You have weakness, vomit repeatedly, or faint.  You feel confused.  You are not able to sleep because of your symptoms.  You have trouble doing daily activities. Summary  COPD exacerbations are episodes when breathing symptoms become much worse and require extra treatment above your normal treatment.  Exacerbations can be severe and even life threatening. Frequent COPD exacerbations can cause further damage to your lungs.  COPD exacerbations are usually triggered by infections such as the flu, colds, and even pneumonia.  Treatment for this condition depends on the severity and cause of the symptoms. You may need to be admitted to a hospital for  treatment.  Quitting smoking is very important to prevent COPD from getting worse and to prevent exacerbations from happening as often. This information is not intended to replace advice given to you by your health care provider. Make sure you discuss any questions you have with your health care provider. Document Revised: 09/13/2017 Document Reviewed: 11/05/2016 Elsevier Patient Education  2021 [employer].

## 2021-03-12 NOTE — Progress Notes (Signed)
Removed PIV and gathered patient belongings. Patient verbalizes understanding of discharge paperwork including appointments and medication regimen.   Husband on the way to pick up patient at DC area.

## 2021-03-23 ENCOUNTER — Other Ambulatory Visit: Payer: Self-pay | Admitting: Family Medicine

## 2021-03-27 ENCOUNTER — Other Ambulatory Visit: Payer: Self-pay | Admitting: Family Medicine

## 2021-03-29 NOTE — Progress Notes (Deleted)
Cardiology Office Note:    Date:  03/29/2021   ID:  [name] [birth year], MRN [id number]  PCP:  [name]  Cardiologist:  Fransico Him, MD  Advanced Practice Provider:  No care team member to display Electrophysiologist:  None    Referring MD: [place], [name], MD    History of Present Illness:    [name] is a 75 y.o. female with a hx of smoking, HTN, Gold stage III COPD, right breast cancer, hyperlipidemia, PAD, and mesenteric vascular disease who was previously followed by [name] who now returns to clinic for follow-up.  She had a mesenteric angiogram done in 5/12 by Dr. Bridgett [name], showing occluded celiac and SMA arteries with patent IMA.  She was planned for aorto-mesenteric bypass. She was referred for cardiology evaluation prior to surgery. However, it was ultimately decided not to treat her mesenteric disease surgically.    Given her exertional chest tightness and shortness of breath as well as known vascular disease, Dr. Aundra [name] set her up for left heart cath.  This was done in 5/12 and showed a 70-80% ostial stenosis of a small to moderate 2nd diagonal.  This was too small to intervene on and not likely to be particularly symptomatic.  Lower extremity arterial dopplers showed a severe focal proximal right SFA stenosis with collaterals from the PFA. She had a VATS for a right-sided lung nodule that showed granulomatous inflammation (no cancer).  She quit smoking for a short time.  She saw Dr. Fletcher [name] for PAD evaluation, and it was decided to treat her medically.    Was hospitalized in 05/2020 with worsening DOE.  Echo showed reduced EF. Seen by cardiology and underwent coronary angiography which was negative for any obstructive disease. She was started on Paxil given significant depression.   She was also found to have left subclavian artery occlusion in 06/2020 and had subsequent angiogram and stent placement per Dr. Donzetta [name].    During last visit on 12/29/20, the patient was doing well.  No claudication symptoms since stent placement.      Past Medical History:  Diagnosis Date   Bilateral leg cramps    Bladder neoplasm    Cancer (HCC)    CHF (congestive heart failure) (HCC)    Chronic deep vein thrombosis (DVT) of right lower extremity ([place])    05/ 2018  chronic non-occlusive DVT RLE   COPD, frequent exacerbations ([place])    03-06-2018 per pt last exacerbation 12/ 2018   Coronary artery disease    cardiologist-  dr [name]-- 03-11-2018 er cath , 80% stenosis in the ostial second diagonal   Dyspnea on minimal exertion    Emphysema/COPD ([place])    CAT score- 17   Family history of breast cancer    Family history of colon cancer    Family history of melanoma    Family history of ovarian cancer    Family history of pancreatic cancer    Fibromyalgia 1995   GERD (gastroesophageal reflux disease)    Hiatal hernia    History of breast cancer    History of diverticulitis of colon    2002  s/p  sigmoid colectomy   History of multiple pulmonary nodules    hx RUL nodules x2  s/p right VATS w/ wedge resection 09-11-2005 and 06-14-2011  both  necrotizing granulomatous inflammation w/ cystic area of necrosis & focal calcification   History of right breast cancer oncologist-  dr [name]-- no recurrence   dx 2010--  IDC, Stage IA , ER/PR+,  (pT1c pN0) 11-09-2008 right lumpectomy;  12-18-2008 right simple mastectomy for DCIS margins;  completed chemotherapy 2010 (no radiation) and completed antiestrogen therapy   Hx of colonic polyps    Dr. 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   Nocturia    OA (osteoarthritis)    thumbs   On supplemental oxygen therapy    03-06-2018 per pt uses only at night,  checks O2 sats at home,  stated am sat 89% after moving around average 93-94% with RA   Osteoporosis     Peripheral arterial occlusive disease ([place]) vascular-- dr chen/ dr [name]   proximal right SFA severe focal stenosis with collaterals from the PFA/  04/ 2012 occluded celiac and SMA arteries with distal reconstitution w/ patent IMA   Peripheral vascular disease ([place])    chronic DVT RLE,  mesenteric vascular disease   Right lower lobe pulmonary nodule    Chest CT 08-29-2017   Stage 3 severe COPD by GOLD classification ([place]) hx frequent exacerbations--   pulmologist-  dr Maryann Alar--  per lov note , dated 12-20-2017, oxyogen 2L is prescribed for use with exertion (but pt only uses mostly at night), O2 sats on RA run the 80s , this day sat 86% RA and with 2L O2 sat 92%   Varicose veins of leg with swelling    varicose vein surgery - Dr. Aleda [name]   Wears glasses    Wears hearing aid in both ears     Past Surgical History:  Procedure Laterality Date   ABDOMINAL AORTOGRAM N/A 07/11/2020   Procedure: ABDOMINAL AORTOGRAM;  Surgeon: Christy Sandy, MD;  Location: [facility];  Service: Cardiovascular;  Laterality: N/A;   AORTIC ARCH ANGIOGRAPHY N/A 07/11/2020   Procedure: AORTIC ARCH ANGIOGRAPHY;  Surgeon: Christy Sandy, MD;  Location: [facility];  Service: Cardiovascular;  Laterality: N/A;   BREAST EXCISIONAL BIOPSY Left 10/15/2008   BREAST LUMPECTOMY W/ NEEDLE LOCALIZATION Right 11-09-2008    dr cornett   [place]  03-12-2011   dr [name]   70-80% ostial stenosis in small second diagonal (appears to small for intervention),  dLAD 40-50%,  minimal luminal irregulartieis involoving LCFx and RCA,  LVEF 55%   CATARACT EXTRACTION W/ INTRAOCULAR LENS  IMPLANT, BILATERAL  2011   CYSTOSCOPY N/A 03/11/2018   Procedure: CYSTOSCOPY WITH INSTILLATION OF POST OPERATIVE EPIRUBICIN;  Surgeon: Christy Aloe, MD;  Location: [facility];  Service: Urology;  Laterality: N/A;   LEFT HEART CATH AND CORONARY ANGIOGRAPHY N/A 05/25/2020   Procedure: LEFT HEART CATH  AND CORONARY ANGIOGRAPHY;  Surgeon: Christy Sine, MD;  Location: [facility];  Service: Cardiovascular;  Laterality: N/A;   MASTECTOMY Right    PARTIAL COLECTOMY  2002   sigmoid and appectomy (diverticulitis)   PARTIAL MASTECTOMY WITH NEEDLE LOCALIZATION Left 02/23/2013   Procedure:  LEFT PARTIAL MASTECTOMY WITH NEEDLE LOCALIZATION;  Surgeon: Adin Hector, MD;  Location: [facility];  Service: General;  Laterality: Left;   PERIPHERAL VASCULAR INTERVENTION  07/11/2020   Procedure: PERIPHERAL VASCULAR INTERVENTION;  Surgeon: Christy Sandy, MD;  Location: [facility];  Service: Cardiovascular;;   RECONSTRUCTION BREAST W/ LATISSIMUS DORSI FLAP Right 05-30-2009   dr Harlow Mares  [facility]  REDUCTION MAMMAPLASTY Left    SIMPLE MASTECTOMY Right 12-28-2008    dr cornett  [facility]   TRANSTHORACIC ECHOCARDIOGRAM  07-09-2016  dr [name]   ef 55-60%, grade 1 diastoic dysfunction/  mild TR   TRANSURETHRAL RESECTION OF BLADDER TUMOR N/A 03/11/2018   Procedure: TRANSURETHRAL RESECTION OF BLADDER TUMOR (TURBT) 2-5cm;  Surgeon: Christy Aloe, MD;  Location: [facility];  Service: Urology;  Laterality: N/A;   UPPER EXTREMITY ANGIOGRAPHY Left 07/11/2020   Procedure: Upper Extremity Angiography;  Surgeon: Christy Sandy, MD;  Location: [place] [facility];  Service: Cardiovascular;  Laterality: Left;   VAGINAL HYSTERECTOMY  1973   VIDEO ASSISTED THORACOSCOPY (VATS)/WEDGE RESECTION Right 09-11-2005  &  06-14-2011   dr Fara Boros  [facility]   both RUL    Current Medications: No outpatient medications have been marked as taking for the 04/10/21 encounter (Appointment) with Freada Bergeron, MD.     Allergies:   Sulfa antibiotics   Social History   Socioeconomic History   Marital status: Married    Spouse name: Not on file   Number of children: 3   Years of education: 12   Highest education level: Not on file  Occupational History   Occupation: beautician     Employer: RETIRED    Comment: retired  Tobacco Use   Smoking status: Former    Packs/day: 0.75    Years: 50.00    Pack years: 37.50    Types: Cigarettes    Quit date: 05/18/2016    Years since quitting: 4.8   Smokeless tobacco: Never  Vaping Use   Vaping Use: Never used  Substance and Sexual Activity   Alcohol use: Yes    Alcohol/week: 10.0 standard drinks    Types: 10 Cans of beer per week   Drug use: No   Sexual activity: Not on file  Other Topics Concern   Not on file  Social History Narrative   HSG, beauty school. Married '69 -10 yrs/widowed; married '79 - 19yr/divorced; married '87.  2 sons - '70, '74, 1 srtep-son; 1 grandchild. 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 SO - good health.  NO history of abuse.  ACP - full code and all heroic measures.    Social Determinants of Health   Financial Resource Strain: Low Risk    Difficulty of Paying Living Expenses: Not hard at all  Food Insecurity: No Food Insecurity   Worried About Charity fundraiser in the Last Year: Never true   [place] in the Last Year: Never true  Transportation Needs: No Transportation Needs   Lack of Transportation (Medical): No   Lack of Transportation (Non-Medical): No  Physical Activity: Inactive   Days of Exercise per Week: 0 days   Minutes of Exercise per Session: 0 min  Stress: No Stress Concern Present   Feeling of Stress : Not at all  Social Connections: Socially Isolated   Frequency of Communication with Friends and Family: Once a week   Frequency of Social Gatherings with Friends and Family: Never   Attends Religious Services: Never   Printmaker: No   Attends Music therapist: Never   Marital Status: Married     Family History: The patient's family history includes Breast cancer (age of onset: 41) in her niece; COPD in her mother; Cancer in her sister; Colon cancer in her mother; Coronary artery disease in her brother and father; Diabetes in her  mother; Emphysema in her mother;  Heart attack in her brother and father; Heart disease in her father; Hypertension in her mother, sister, and sister; Melanoma in her niece and sister; Ovarian cancer in her sister; Pancreatic cancer in her sister; Sudden death in her father; Throat cancer in her sister.  ROS:   Please see the history of present illness.    Review of Systems  Constitutional:  Negative for chills and fever.  HENT:  Negative for hearing loss.   Eyes:  Negative for blurred vision and redness.  Respiratory:  Positive for shortness of breath.   Cardiovascular:  Negative for chest pain, palpitations, orthopnea, claudication, leg swelling and PND.  Gastrointestinal:  Negative for melena, nausea and vomiting.  Genitourinary:  Negative for dysuria and flank pain.  Musculoskeletal:  Positive for myalgias.  Skin:  Positive for itching and rash.  Neurological:  Negative for dizziness and loss of consciousness.  Endo/Heme/Allergies:  Negative for polydipsia.  Psychiatric/Behavioral:  Negative for substance abuse.    EKGs/Labs/Other Studies Reviewed:    The following studies were reviewed today: 07/11/2020 Pre-operative Diagnosis: Left subclavian artery steal Post-operative diagnosis:  Same Surgeon:  Erlene Quan C. Christy Matters, MD Procedure Performed: 1.  Ultrasound-guided cannulation right common femoral artery 2.  Arch aortogram 3.  Stent of left subclavian artery with 8 x 29 mm VBX 4.  Abdominal aortogram 5.  Moderate sedation with fentanyl Versed for 48 minutes   Indications: 75 year old female with known history of mesenteric artery stenoses.  This has never been intervened upon.  She more recently was found to have a subtotally occluded left subclavian artery with retrograde flow in her vertebral artery which was noted to be symptomatic.  She is now indicated for angiography possible invention.   Findings: Left subclavian artery was heavily calcified she had retrograde flow in her left  vertebral artery.  She appears to have a bovine arch these arteries are all patent.  After intervention of the left subclavian artery there is antegrade flow in the left vertebral artery 0% residual stenosis were previously was occluded.   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  Bilateral renal arteries without stenosis.         LEFT HEART CATH AND CORONARY ANGIOGRAPHY 05/2020  Conclusion   No significant coronary obstructive disease.  The left main is short and immediately trifurcates into a large LAD, ramus immediate and left circumflex vessel.  The midportion of the LAD dips intramyocardially slightly but there is no evidence for systolic bridging.  The RCA is a dominant vessel that has calcification without obstructive stenosis.   Hyperdynamic LV function with EF estimated at 65%.  LVEDP 10 mmHg.   RECOMMENDATION: Medical therapy.     Echo Impression 05/21/20   1. Left ventricular ejection fraction, by estimation, is 45 to 50%. The  left ventricle has mildly decreased function. The left ventricle  demonstrates global hypokinesis. Indeterminate diastolic filling due to  E-A fusion. Definity contrast administered  but images remain suboptimal to exclude regional wall motion  abnormalities.   2. Right ventricular systolic function is normal. The right ventricular  size is normal. Tricuspid regurgitation signal is inadequate for assessing  PA pressure.   3. Left atrial size was mildly dilated.   4. The mitral valve is degenerative. Trivial mitral valve regurgitation.  Mild mitral stenosis. The mean mitral valve gradient is 3.0 mmHg with  average heart rate of 85 bpm.   5. The aortic valve is grossly normal. Aortic valve regurgitation is  not  visualized. No aortic stenosis is present.   6. The inferior vena cava is dilated in size with >50% respiratory  variability, suggesting right atrial pressure of 8 mmHg.     EKG:  EKG is  ordered today.  The  ekg ordered today demonstrates NSR with HR 73  Recent Labs: 05/21/2020: TSH 0.433 09/14/2020: Magnesium 2.2 03/09/2021: ALT 28; B Natriuretic Peptide 58.7 03/10/2021: BUN 15; Creatinine, Ser 0.70; Hemoglobin 10.6; Platelets 595; Potassium 3.8; Sodium 133  Recent Lipid Panel    Component Value Date/Time   CHOL 147 06/04/2018 1200   TRIG 73 06/04/2018 1200   TRIG 70 08/28/2006 0847   HDL 83 06/04/2018 1200   CHOLHDL 1.8 06/04/2018 1200   CHOLHDL 2.4 09/03/2016 1527   VLDL 49 (H) 09/03/2016 1527   LDLCALC 49 06/04/2018 1200   LDLDIRECT 88.0 08/18/2013 1001     Physical Exam:    VS:  There were no vitals taken for this visit.    Wt Readings from Last 3 Encounters:  03/09/21 128 lb 1.4 oz (58.1 kg)  12/29/20 130 lb 3.2 oz (59.1 kg)  11/25/20 132 lb 1.6 oz (59.9 kg)     GEN:  Thin, elderly female, comfortable HEENT: Normal NECK: No JVD; No carotid bruits CARDIAC: RRR, no murmurs, rubs, gallops RESPIRATORY: Diminished but clear with no wheezes ABDOMEN: Soft, non-tender, non-distended MUSCULOSKELETAL:  No edema; No deformity  SKIN: Diffuse erythematous raised rash on both upper and lower extremities NEUROLOGIC:  Alert and oriented x 3 PSYCHIATRIC:  Normal affect   ASSESSMENT:    No diagnosis found.  PLAN:    In order of problems listed above:  #Heart Failure with Mildly Reduced LVEF 45-50%: Recently diagnosed on admission for DOE in 05/2020. Cath negative for obstructive disease. Euvolemic. Unable to tolerate GDMT due to dizziness. Feels well with no anginal or HF symptoms -Continue low dose metoprolol  #Severe PAD: Followed by Dr. Donzetta [name]. Recent left subclavian stenting. -Continue ASA 81, plavix 75 -Continue atorvastatin 80mg  daily -Continue cilostizol -Follow-up with Dr. Donzetta [name]  #Severe COPD: Has had multiple hospitalizations for acute exacerbations. -Continue follow-up with pulm -Continue home inhalers  #HLD: -Lipitor 80mg  daily  #Prior smoker: -Has abstained  from smoking; doing well  #Mild carotid artery stenosis: 1-39% bilaterally on carotid in 06/2020. -Continue asa 81mg  daily, plavix 75mg  daily and lipitor 80mg  daily      Medication Adjustments/Labs and Tests Ordered: Current medicines are reviewed at length with the patient today.  Concerns regarding medicines are outlined above.  No orders of the defined types were placed in this encounter.  No orders of the defined types were placed in this encounter.   There are no Patient Instructions on file for this visit.    Signed, Freada Bergeron, MD  03/29/2021 3:03 PM    [facility]

## 2021-04-10 ENCOUNTER — Ambulatory Visit: Payer: Medicare Other | Admitting: Cardiology

## 2021-04-10 NOTE — Progress Notes (Incomplete)
Cardiology Office Note:    Date:  04/10/2021   ID:  Christy, [name] 24-Jan-1946, MRN [id number]  PCP:  Martinique, [name], Hunter  Cardiologist:  Fransico Him, MD  Advanced Practice Provider:  No care team member to display Electrophysiologist:  None    Referring MD: Martinique, [name], MD    History of Present Illness:    [name] is a 75 y.o. female with a hx of smoking, HTN, Gold stage III COPD, right breast cancer, hyperlipidemia, PAD, and mesenteric vascular disease who was previously followed by Truitt Merle who now returns to clinic for follow-up.  She had a mesenteric angiogram done in 5/12 by Dr. Bridgett Larsson, showing occluded celiac and SMA arteries with patent IMA.  She was planned for aorto-mesenteric bypass. She was referred for cardiology evaluation prior to surgery. However, it was ultimately decided not to treat her mesenteric disease surgically.    Given her exertional chest tightness and shortness of breath as well as known vascular disease, Dr. Aundra Dubin set her up for left heart cath.  This was done in 5/12 and showed a 70-80% ostial stenosis of a small to moderate 2nd diagonal.  This was too small to intervene on and not likely to be particularly symptomatic.  Lower extremity arterial dopplers showed a severe focal proximal right SFA stenosis with collaterals from the PFA. She had a VATS for a right-sided lung nodule that showed granulomatous inflammation (no cancer).  She quit smoking for a short time.  She saw Dr. Fletcher Anon for PAD evaluation, and it was decided to treat her medically.    Was hospitalized in 05/2020 with worsening DOE.  Echo showed reduced EF. Seen by cardiology and underwent coronary angiography which was negative for any obstructive disease. She was started on Paxil given significant depression.   She was also found to have left subclavian artery occlusion in 06/2020 and had subsequent angiogram and stent placement per Dr. Donzetta Matters.    During last visit on 12/29/20, the patient was doing well.  No claudication symptoms since stent placement.    Today,  She denies any chest pain, shortness of breath, palpitations, or exertional symptoms. No headaches, lightheadedness, or syncope to report. Also has no lower extremity edema, orthopnea or PND.    Past Medical History:  Diagnosis Date   Bilateral leg cramps    Bladder neoplasm    Cancer (HCC)    CHF (congestive heart failure) (HCC)    Chronic deep vein thrombosis (DVT) of right lower extremity ([place])    05/ 2018  chronic non-occlusive DVT RLE   COPD, frequent exacerbations ([place])    03-06-2018 per pt last exacerbation 12/ 2018   Coronary artery disease    cardiologist-  dr Aundra Dubin-- 03-11-2018 er cath , 80% stenosis in the ostial second diagonal   Dyspnea on minimal exertion    Emphysema/COPD ([place])    CAT score- 17   Family history of breast cancer    Family history of colon cancer    Family history of melanoma    Family history of ovarian cancer    Family history of pancreatic cancer    Fibromyalgia 1995   GERD (gastroesophageal reflux disease)    Hiatal hernia    History of breast cancer    History of diverticulitis of colon    2002  s/p  sigmoid colectomy   History of multiple pulmonary nodules    hx RUL nodules x2  s/p  right VATS w/ wedge resection 09-11-2005 and 06-14-2011  both  necrotizing granulomatous inflammation w/ cystic area of necrosis & focal calcification   History of right breast cancer oncologist-  dr Jana Hakim-- no recurrence   dx 2010--  IDC, Stage IA , ER/PR+,  (pT1c pN0) 11-09-2008 right lumpectomy;  12-18-2008 right simple mastectomy for DCIS margins;  completed chemotherapy 2010 (no radiation) and completed antiestrogen therapy   Hx of colonic polyps    Dr. 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   Nocturia    OA  (osteoarthritis)    thumbs   On supplemental oxygen therapy    03-06-2018 per pt uses only at night,  checks O2 sats at home,  stated am sat 89% after moving around average 93-94% with RA   Osteoporosis    Peripheral arterial occlusive disease ([place]) vascular-- dr chen/ dr Donzetta Matters   proximal right SFA severe focal stenosis with collaterals from the PFA/  04/ 2012 occluded celiac and SMA arteries with distal reconstitution w/ patent IMA   Peripheral vascular disease ([place])    chronic DVT RLE,  mesenteric vascular disease   Right lower lobe pulmonary nodule    Chest CT 08-29-2017   Stage 3 severe COPD by GOLD classification ([place]) hx frequent exacerbations--   pulmologist-  dr Maryann Alar--  per lov note , dated 12-20-2017, oxyogen 2L is prescribed for use with exertion (but pt only uses mostly at night), O2 sats on RA run the 80s , this day sat 86% RA and with 2L O2 sat 92%   Varicose veins of leg with swelling    varicose vein surgery - Dr. Aleda Grana   Wears glasses    Wears hearing aid in both ears     Past Surgical History:  Procedure Laterality Date   ABDOMINAL AORTOGRAM N/A 07/11/2020   Procedure: ABDOMINAL AORTOGRAM;  Surgeon: Waynetta Sandy, MD;  Location: [facility];  Service: Cardiovascular;  Laterality: N/A;   AORTIC ARCH ANGIOGRAPHY N/A 07/11/2020   Procedure: AORTIC ARCH ANGIOGRAPHY;  Surgeon: Waynetta Sandy, MD;  Location: [facility];  Service: Cardiovascular;  Laterality: N/A;   BREAST EXCISIONAL BIOPSY Left 10/15/2008   BREAST LUMPECTOMY W/ NEEDLE LOCALIZATION Right 11-09-2008    dr cornett   [place] [facility]  03-12-2011   dr Aundra Dubin   70-80% ostial stenosis in small second diagonal (appears to small for intervention),  dLAD 40-50%,  minimal luminal irregulartieis involoving LCFx and RCA,  LVEF 55%   CATARACT EXTRACTION W/ INTRAOCULAR LENS  IMPLANT, BILATERAL  2011   CYSTOSCOPY N/A 03/11/2018   Procedure: CYSTOSCOPY WITH  INSTILLATION OF POST OPERATIVE EPIRUBICIN;  Surgeon: Festus Aloe, MD;  Location: [facility];  Service: Urology;  Laterality: N/A;   LEFT HEART CATH AND CORONARY ANGIOGRAPHY N/A 05/25/2020   Procedure: LEFT HEART CATH AND CORONARY ANGIOGRAPHY;  Surgeon: Troy Sine, MD;  Location: [facility];  Service: Cardiovascular;  Laterality: N/A;   MASTECTOMY Right    PARTIAL COLECTOMY  2002   sigmoid and appectomy (diverticulitis)   PARTIAL MASTECTOMY WITH NEEDLE LOCALIZATION Left 02/23/2013   Procedure:  LEFT PARTIAL MASTECTOMY WITH NEEDLE LOCALIZATION;  Surgeon: Adin Hector, MD;  Location: [facility];  Service: General;  Laterality: Left;   PERIPHERAL VASCULAR INTERVENTION  07/11/2020   Procedure: PERIPHERAL VASCULAR INTERVENTION;  Surgeon:  Waynetta Sandy, MD;  Location: [facility];  Service: Cardiovascular;;   RECONSTRUCTION BREAST W/ LATISSIMUS DORSI FLAP Right 05-30-2009   dr Harlow Mares  [facility]   REDUCTION MAMMAPLASTY Left    SIMPLE MASTECTOMY Right 12-28-2008    dr cornett  [facility]   TRANSTHORACIC ECHOCARDIOGRAM  07-09-2016  dr Aundra Dubin   ef 55-60%, grade 1 diastoic dysfunction/  mild TR   TRANSURETHRAL RESECTION OF BLADDER TUMOR N/A 03/11/2018   Procedure: TRANSURETHRAL RESECTION OF BLADDER TUMOR (TURBT) 2-5cm;  Surgeon: Festus Aloe, MD;  Location: [facility];  Service: Urology;  Laterality: N/A;   UPPER EXTREMITY ANGIOGRAPHY Left 07/11/2020   Procedure: Upper Extremity Angiography;  Surgeon: Waynetta Sandy, MD;  Location: [facility];  Service: Cardiovascular;  Laterality: Left;   VAGINAL HYSTERECTOMY  1973   VIDEO ASSISTED THORACOSCOPY (VATS)/WEDGE RESECTION Right 09-11-2005  &  06-14-2011   dr Fara Boros  [facility]   both RUL    Current Medications: No outpatient medications have been marked as taking for the 04/10/21 encounter (Appointment) with Freada Bergeron, MD.     Allergies:   Sulfa antibiotics   Social History    Socioeconomic History   Marital status: Married    Spouse name: Not on file   Number of children: 3   Years of education: 12   Highest education level: Not on file  Occupational History   Occupation: beautician    Employer: RETIRED    Comment: retired  Tobacco Use   Smoking status: Former    Packs/day: 0.75    Years: 50.00    Pack years: 37.50    Types: Cigarettes    Quit date: 05/18/2016    Years since quitting: 4.8   Smokeless tobacco: Never  Vaping Use   Vaping Use: Never used  Substance and Sexual Activity   Alcohol use: Yes    Alcohol/week: 10.0 standard drinks    Types: 10 Cans of beer per week   Drug use: No   Sexual activity: Not on file  Other Topics Concern   Not on file  Social History Narrative   HSG, beauty school. Married '69 -74 yrs/widowed; married '79 - 65yr/divorced; married '87.  2 sons - '70, '74, 1 srtep-son; 1 grandchild. 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 SO - good health.  NO history of abuse.  ACP - full code and all heroic measures.    Social Determinants of Health   Financial Resource Strain: Low Risk    Difficulty of Paying Living Expenses: Not hard at all  Food Insecurity: No Food Insecurity   Worried About Charity fundraiser in the Last Year: Never true   [place] in the Last Year: Never true  Transportation Needs: No Transportation Needs   Lack of Transportation (Medical): No   Lack of Transportation (Non-Medical): No  Physical Activity: Inactive   Days of Exercise per Week: 0 days   Minutes of Exercise per Session: 0 min  Stress: No Stress Concern Present   Feeling of Stress : Not at all  Social Connections: Socially Isolated   Frequency of Communication with Friends and Family: Once a week   Frequency of Social Gatherings with Friends and Family: Never   Attends Religious Services: Never   Marine scientist or Organizations: No   Attends Music therapist: Never   Marital Status: Married     Family  History: The patient's family history includes Breast cancer (age of onset: 64) in  her niece; COPD in her mother; Cancer in her sister; Colon cancer in her mother; Coronary artery disease in her brother and father; Diabetes in her mother; Emphysema in her mother; Heart attack in her brother and father; Heart disease in her father; Hypertension in her mother, sister, and sister; Melanoma in her niece and sister; Ovarian cancer in her sister; Pancreatic cancer in her sister; Sudden death in her father; Throat cancer in her sister.  ROS:   Please see the history of present illness.    Review of Systems  Constitutional:  Negative for chills and fever.  HENT:  Negative for hearing loss.   Eyes:  Negative for blurred vision and redness.  Respiratory:  Positive for shortness of breath.   Cardiovascular:  Negative for chest pain, palpitations, orthopnea, claudication, leg swelling and PND.  Gastrointestinal:  Negative for melena, nausea and vomiting.  Genitourinary:  Negative for dysuria and flank pain.  Musculoskeletal:  Positive for myalgias.  Skin:  Positive for itching and rash.  Neurological:  Negative for dizziness and loss of consciousness.  Endo/Heme/Allergies:  Negative for polydipsia.  Psychiatric/Behavioral:  Negative for substance abuse.    EKGs/Labs/Other Studies Reviewed:    The following studies were reviewed today: 07/11/2020 Pre-operative Diagnosis: Left subclavian artery steal Post-operative diagnosis:  Same Surgeon:  Erlene Quan C. Donzetta Matters, MD Procedure Performed: 1.  Ultrasound-guided cannulation right common femoral artery 2.  Arch aortogram 3.  Stent of left subclavian artery with 8 x 29 mm VBX 4.  Abdominal aortogram 5.  Moderate sedation with fentanyl Versed for 48 minutes   Indications: 75 year old female with known history of mesenteric artery stenoses.  This has never been intervened upon.  She more recently was found to have a subtotally occluded left subclavian artery  with retrograde flow in her vertebral artery which was noted to be symptomatic.  She is now indicated for angiography possible invention.   Findings: Left subclavian artery was heavily calcified she had retrograde flow in her left vertebral artery.  She appears to have a bovine arch these arteries are all patent.  After intervention of the left subclavian artery there is antegrade flow in the left vertebral artery 0% residual stenosis were previously was occluded.   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  Bilateral renal arteries without stenosis.         LEFT HEART CATH AND CORONARY ANGIOGRAPHY 05/2020  Conclusion   No significant coronary obstructive disease.  The left main is short and immediately trifurcates into a large LAD, ramus immediate and left circumflex vessel.  The midportion of the LAD dips intramyocardially slightly but there is no evidence for systolic bridging.  The RCA is a dominant vessel that has calcification without obstructive stenosis.   Hyperdynamic LV function with EF estimated at 65%.  LVEDP 10 mmHg.   RECOMMENDATION: Medical therapy.     Echo Impression 05/21/20   1. Left ventricular ejection fraction, by estimation, is 45 to 50%. The  left ventricle has mildly decreased function. The left ventricle  demonstrates global hypokinesis. Indeterminate diastolic filling due to  E-A fusion. Definity contrast administered  but images remain suboptimal to exclude regional wall motion  abnormalities.   2. Right ventricular systolic function is normal. The right ventricular  size is normal. Tricuspid regurgitation signal is inadequate for assessing  PA pressure.   3. Left atrial size was mildly dilated.   4. The mitral valve is degenerative. Trivial mitral valve regurgitation.  Mild  mitral stenosis. The mean mitral valve gradient is 3.0 mmHg with  average heart rate of 85 bpm.   5. The aortic valve is grossly normal. Aortic valve  regurgitation is not  visualized. No aortic stenosis is present.   6. The inferior vena cava is dilated in size with >50% respiratory  variability, suggesting right atrial pressure of 8 mmHg.     EKG:   04/10/2021: *** 12/29/2020: NSR with HR 73  Recent Labs: 05/21/2020: TSH 0.433 09/14/2020: Magnesium 2.2 03/09/2021: ALT 28; B Natriuretic Peptide 58.7 03/10/2021: BUN 15; Creatinine, Ser 0.70; Hemoglobin 10.6; Platelets 595; Potassium 3.8; Sodium 133  Recent Lipid Panel    Component Value Date/Time   CHOL 147 06/04/2018 1200   TRIG 73 06/04/2018 1200   TRIG 70 08/28/2006 0847   HDL 83 06/04/2018 1200   CHOLHDL 1.8 06/04/2018 1200   CHOLHDL 2.4 09/03/2016 1527   VLDL 49 (H) 09/03/2016 1527   LDLCALC 49 06/04/2018 1200   LDLDIRECT 88.0 08/18/2013 1001     Physical Exam:    VS:  There were no vitals taken for this visit.    Wt Readings from Last 3 Encounters:  03/09/21 128 lb 1.4 oz (58.1 kg)  12/29/20 130 lb 3.2 oz (59.1 kg)  11/25/20 132 lb 1.6 oz (59.9 kg)     GEN:  Thin, elderly female, comfortable HEENT: Normal NECK: No JVD; No carotid bruits CARDIAC: RRR, no murmurs, rubs, gallops RESPIRATORY: Diminished*** but clear with no wheezes ABDOMEN: Soft, non-tender, non-distended MUSCULOSKELETAL:  No edema; No deformity  SKIN: Diffuse erythematous raised rash on both upper and lower extremities*** NEUROLOGIC:  Alert and oriented x 3 PSYCHIATRIC:  Normal affect   ASSESSMENT:    No diagnosis found.  PLAN:    In order of problems listed above:  #Heart Failure with Mildly Reduced LVEF 45-50%: Recently diagnosed on admission for DOE in 05/2020. Cath negative for obstructive disease. Euvolemic. Unable to tolerate GDMT due to dizziness. Feels well with no anginal or HF symptoms -Continue low dose metoprolol  #Severe PAD: Followed by Dr. Donzetta Matters. Recent left subclavian stenting. -Continue ASA 81, plavix 75 -Continue atorvastatin 80mg  daily -Continue cilostizol -Follow-up  with Dr. Donzetta Matters  #Severe COPD: Has had multiple hospitalizations for acute exacerbations. -Continue follow-up with pulm -Continue home inhalers  #HLD: -Lipitor 80mg  daily  #Prior smoker: -Has abstained from smoking; doing well  #Mild carotid artery stenosis: 1-39% bilaterally on carotid in 06/2020. -Continue asa 81mg  daily, plavix 75mg  daily and lipitor 80mg  daily    Follow-up in *** months.  Medication Adjustments/Labs and Tests Ordered: Current medicines are reviewed at length with the patient today.  Concerns regarding medicines are outlined above.  No orders of the defined types were placed in this encounter.  No orders of the defined types were placed in this encounter.   There are no Patient Instructions on file for this visit.  I,Mathew Stumpf,acting as a Education administrator for Freada Bergeron, MD.,have documented all relevant documentation on the behalf of Freada Bergeron, MD,as directed by  Freada Bergeron, MD while in the presence of Freada Bergeron, MD.  ***   Signed, Madelin Rear  04/10/2021 12:59 PM    [place]

## 2021-04-21 ENCOUNTER — Other Ambulatory Visit: Payer: Self-pay | Admitting: Primary Care

## 2021-05-09 ENCOUNTER — Other Ambulatory Visit: Payer: Self-pay | Admitting: Family Medicine

## 2021-05-09 ENCOUNTER — Other Ambulatory Visit: Payer: Self-pay | Admitting: Primary Care

## 2021-05-09 DIAGNOSIS — G4700 Insomnia, unspecified: Secondary | ICD-10-CM

## 2021-05-09 DIAGNOSIS — F419 Anxiety disorder, unspecified: Secondary | ICD-10-CM

## 2021-05-12 ENCOUNTER — Other Ambulatory Visit: Payer: Self-pay | Admitting: Family Medicine

## 2021-05-12 ENCOUNTER — Telehealth: Payer: Self-pay | Admitting: Family Medicine

## 2021-05-12 DIAGNOSIS — F419 Anxiety disorder, unspecified: Secondary | ICD-10-CM

## 2021-05-12 DIAGNOSIS — G4700 Insomnia, unspecified: Secondary | ICD-10-CM

## 2021-05-12 MED ORDER — LORAZEPAM 2 MG PO TABS: 2.0000 mg | ORAL_TABLET | Freq: Every day | ORAL | 0 refills | Status: DC

## 2021-05-12 NOTE — Telephone Encounter (Signed)
I called and spoke with patient. She is not having any suicidal thoughts nor is she planning to over dose. She stated to me that she would have gone to the pharmacy to get over the counter sleep medication if we didn't send the medication in for her. Patient has no intentions of over dosing or anything along those lines. She is going out of town on Sunday for her brother in Pharmacist, hospital funeral and just needs to get back to getting her good nights rest. Patient has an appointment set for 05/19/21 for when she returns.

## 2021-05-12 NOTE — Telephone Encounter (Signed)
Sent Rx for Lorazepam 2 mg #8. She is overdue for follow up. She has an appt next week, 05/19/21. Hadyn Azer [place], MD

## 2021-05-12 NOTE — Telephone Encounter (Signed)
This is a very serious threat and indicates that her anxiety is not well controlled. Is she having suicidal thoughts at this time? Does she have a plan?  She needs to call [phone] if she is having suicidal thoughts at this time she can also go to the ER now.  I do not feel comfortable continuing prescribing Lorazepam. We need to have her establishing with psychiatrist. Thanks, BJ

## 2021-05-12 NOTE — Telephone Encounter (Signed)
Pt call and stated she need her medication today and she better get it or she will go to the pharmcy and get all the over the counter medication and OD on it.

## 2021-05-18 ENCOUNTER — Other Ambulatory Visit: Payer: Self-pay

## 2021-05-19 ENCOUNTER — Encounter: Payer: Self-pay | Admitting: Family Medicine

## 2021-05-19 ENCOUNTER — Ambulatory Visit (INDEPENDENT_AMBULATORY_CARE_PROVIDER_SITE_OTHER): Payer: Medicare Other | Admitting: Family Medicine

## 2021-05-19 VITALS — BP 120/70 | HR 90 | Resp 16 | Ht 66.0 in | Wt 130.0 lb

## 2021-05-19 DIAGNOSIS — G4700 Insomnia, unspecified: Secondary | ICD-10-CM

## 2021-05-19 DIAGNOSIS — L2989 Other pruritus: Secondary | ICD-10-CM

## 2021-05-19 DIAGNOSIS — F321 Major depressive disorder, single episode, moderate: Secondary | ICD-10-CM | POA: Diagnosis not present

## 2021-05-19 DIAGNOSIS — I5020 Unspecified systolic (congestive) heart failure: Secondary | ICD-10-CM

## 2021-05-19 DIAGNOSIS — D473 Essential (hemorrhagic) thrombocythemia: Secondary | ICD-10-CM | POA: Diagnosis not present

## 2021-05-19 DIAGNOSIS — F419 Anxiety disorder, unspecified: Secondary | ICD-10-CM

## 2021-05-19 DIAGNOSIS — K219 Gastro-esophageal reflux disease without esophagitis: Secondary | ICD-10-CM

## 2021-05-19 DIAGNOSIS — L298 Other pruritus: Secondary | ICD-10-CM

## 2021-05-19 DIAGNOSIS — I70231 Atherosclerosis of native arteries of right leg with ulceration of thigh: Secondary | ICD-10-CM

## 2021-05-19 MED ORDER — TRAZODONE HCL 100 MG PO TABS: ORAL_TABLET | ORAL | 0 refills | Status: DC

## 2021-05-19 MED ORDER — LORAZEPAM 2 MG PO TABS: 2.0000 mg | ORAL_TABLET | Freq: Every day | ORAL | 3 refills | Status: DC

## 2021-05-19 MED ORDER — ESOMEPRAZOLE MAGNESIUM 40 MG PO CPDR
40.0000 mg | DELAYED_RELEASE_CAPSULE | Freq: Every day | ORAL | 1 refills | Status: DC
Start: 2021-05-19 — End: 2021-11-07

## 2021-05-19 MED ORDER — DOXEPIN HCL 10 MG/ML PO CONC
3.0000 mg | Freq: Every day | ORAL | 3 refills | Status: DC
Start: 2021-05-19 — End: 2021-06-20

## 2021-05-19 NOTE — Patient Instructions (Addendum)
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 Do not use My Chart to request refills or for acute issues that need immediate attention.   Try to take last Furosemide dose after lunch. Doxepin started today, 3 mg (0.3 ml) and increase to 6 mg (0.6 ml) and 10 mg (1 ml) if needed for sleep.Medication may also help with skin itching.  We are going to wean off Trazodone, so decrease to 50 mg (1/2 tab) for a week then every other day for a week. Fall precautions.  Stop Protonix and start Nexium. No changes in Pepcid.   Please be sure medication list is accurate. If a new problem present, please set up appointment sooner than planned today.

## 2021-05-19 NOTE — Progress Notes (Signed)
HPI: [name] is a 75 y.o. 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 She was last seen on 09/26/20. Since her last visit she has seen her dermatologist for eczema. Pruritic rash affecting upper and lower extremities. Started on new medication,Dupixent 300 mg q 2 weeks, that has helped some.  Pruritus interferes with sleep. Insomnia and anxiety on Trazodone 100 mg and Lorazepam 2 mg at bedtime. She has been on Lorazepam for many years. Denies side effects.  Apparently it was a misunderstanding recently when she was requesting refills on Lorazepam. I received a message suggesting she was thinking about "overdose" with OTC sleep aids if Lorazepam Rx was not sent to her pharmacy. She denies any suicidal thought or plan.  She has lost several family members in the past 2 years. Denies suicidal thoughts. Depression has improved with Paroxetine 20 mg, started while hospitalized in 05/2020.  Depression screen [facility] 2/9 05/19/2021 12/21/2020 10/27/2018 05/16/2018 01/03/2018  Decreased Interest 3 0 0 0 0  Down, Depressed, Hopeless 1 0 0 0 0  PHQ - 2 Score 4 0 0 0 0  Altered sleeping 3 - - - -  Tired, decreased energy 2 - - - -  Change in appetite 1 - - - -  Feeling bad or failure about yourself  0 - - - -  Trouble concentrating 1 - - - -  Moving slowly or fidgety/restless 0 - - - -  Suicidal thoughts 0 - - - -  PHQ-9 Score 11 - - - -  Difficult doing work/chores Somewhat difficult - - - -  Some recent data might be hidden   GAD 7 : Generalized Anxiety Score 05/19/2021  Nervous, Anxious, on Edge 1  Control/stop worrying 1  Worry too much - different things 0  Trouble  relaxing 0  Restless 1  Easily annoyed or irritable 0  Afraid - awful might happen 0  Total GAD 7 Score 3  Anxiety Difficulty Somewhat difficult   Sleeping a few hours at the time wakes up around 3 am and cannot go back to sleep. Nocturia x 4. Follows with urologist. She is on Flomax 0.4 mg daily.  Since her last visit she was also hospitalized from 5/26-5/29/22 because worsening respiratory symptoms. Cough, DOE,and wheezing have improved. She is feeling better since hospital discharge. Follows with pulmonologist. O2 supplement at night. OSA on CPAP, follows with Dr Annamaria Boots.  PAD on Plavix 75 mg daily, Pletal 100 mg bid,and Atorvastatin 80 mg daily. Follows with cardiologist and vascular surgeon.  Acid reflux and heartburn: She is on Protonix and Pepcid. Still having symptoms. She has not identified exacerbating or alleviated factors. She is also on Pepcid.  She has also seen her oncologist, right breast cancer,intermittent thrombocytosis. Lab Results  Component Value Date   WBC 8.9 03/10/2021   HGB 10.6 (L) 03/10/2021   HCT 32.6 (L) 03/10/2021   MCV 86.7 03/10/2021   PLT 595 (H) 03/10/2021   Review of Systems  Constitutional:  Positive for fatigue. Negative for activity change, appetite change and fever.  HENT:  Negative for mouth sores, nosebleeds and sore throat.   Eyes:  Negative for redness and visual disturbance.  Cardiovascular:  Negative for chest pain, palpitations  and leg swelling.  Gastrointestinal:  Negative for abdominal pain, nausea and vomiting.       Negative for changes in bowel habits.  Genitourinary:  Negative for decreased urine volume, dysuria and hematuria.  Skin:  Positive for rash.  Neurological:  Negative for syncope, weakness and headaches.  Psychiatric/Behavioral:  Positive for sleep disturbance. Negative for confusion and hallucinations. The patient is nervous/anxious.   Rest see pertinent positives and negatives per HPI.  Current Outpatient  Medications on File Prior to Visit  Medication Sig Dispense Refill   albuterol (VENTOLIN HFA) 108 (90 Base) MCG/ACT inhaler TAKE 2 PUFFS BY MOUTH EVERY 6 HOURS AS NEEDED FOR WHEEZE OR SHORTNESS OF BREATH (Patient taking differently: Inhale 1 puff into the lungs every 4 (four) hours as needed for wheezing or shortness of breath.) 8.5 each 3   aspirin EC 81 MG tablet Take 1 tablet (81 mg total) by mouth daily. 30 tablet    atorvastatin (LIPITOR) 80 MG tablet TAKE 1 TABLET BY MOUTH EVERY DAY (Patient taking differently: Take 80 mg by mouth daily.) 90 tablet 2   Biotin 10000 MCG TABS Take 10,000 mcg by mouth daily.     budesonide (PULMICORT) 0.5 MG/2ML nebulizer solution Take 2 mLs (0.5 mg total) by nebulization 2 (two) times daily. 20 mL 12   cilostazol (PLETAL) 100 MG tablet TAKE 1 TABLET BY MOUTH TWICE A DAY (Patient taking differently: Take 100 mg by mouth 2 (two) times daily.) 180 tablet 3   clopidogrel (PLAVIX) 75 MG tablet Take 1 tablet (75 mg total) by mouth daily. 30 tablet 11   DUPIXENT 300 MG/2ML SOPN Inject 2 mLs into the skin every 14 (fourteen) days.     fluticasone (FLONASE) 50 MCG/ACT nasal spray SPRAY 1 SPRAY INTO EACH NOSTRIL TWICE A DAY (Patient taking differently: Place 1 spray into both nostrils 2 (two) times daily.) 48 mL 1   furosemide (LASIX) 40 MG tablet TAKE 1 TABLET BY MOUTH TWICE A DAY 180 tablet 0   hydrOXYzine (ATARAX/VISTARIL) 25 MG tablet Take 25 mg by mouth daily.     ibuprofen (ADVIL) 200 MG tablet Take 200 mg by mouth every 6 (six) hours as needed for mild pain.     ipratropium-albuterol (DUONEB) 0.5-2.5 (3) MG/3ML SOLN INHALE 3 MLS INTO THE LUNGS EVERY 6 (SIX) HOURS AS NEEDED (SOB, WHEEZING). 360 mL 4   KLOR-CON M20 20 MEQ tablet TAKE 1 TABLET BY MOUTH THREE TIMES A DAY (Patient taking differently: Take 20 mEq by mouth 3 (three) times daily.) 270 tablet 1   levothyroxine (SYNTHROID) 112 MCG tablet TAKE 1 TABLET BY MOUTH EVERY DAY IN THE MORNING BEFORE BREAKFAST  (Patient taking differently: Take 112 mcg by mouth daily before breakfast.) 90 tablet 2   montelukast (SINGULAIR) 10 MG tablet TAKE 1 TABLET BY MOUTH EVERYDAY AT BEDTIME 90 tablet 1   PARoxetine (PAXIL) 20 MG tablet TAKE 1 TABLET BY MOUTH EVERYDAY AT BEDTIME (Patient taking differently: Take 20 mg by mouth daily.) 90 tablet 1   tamsulosin (FLOMAX) 0.4 MG CAPS capsule TAKE 1 CAPSULE BY MOUTH EVERY DAY (Patient taking differently: Take 0.4 mg by mouth daily.) 90 capsule 0   TRELEGY ELLIPTA 200-62.5-25 MCG/INH AEPB TAKE 1 PUFF BY MOUTH EVERY DAY (Patient taking differently: Inhale 1 puff into the lungs daily.) 60 each 5   No current facility-administered medications on file prior to visit.   Past Medical History:  Diagnosis Date   Bilateral leg cramps    Bladder neoplasm  Cancer [facility])    CHF (congestive heart failure) (HCC)    Chronic deep vein thrombosis (DVT) of right lower extremity ([place])    05/ 2018  chronic non-occlusive DVT RLE   COPD, frequent exacerbations ([place])    03-06-2018 per pt last exacerbation 12/ 2018   Coronary artery disease    cardiologist-  dr Aundra Dubin-- 03-11-2018 er cath , 80% stenosis in the ostial second diagonal   Dyspnea on minimal exertion    Emphysema/COPD ([place])    CAT score- 17   Family history of breast cancer    Family history of colon cancer    Family history of melanoma    Family history of ovarian cancer    Family history of pancreatic cancer    Fibromyalgia 1995   GERD (gastroesophageal reflux disease)    Hiatal hernia    History of breast cancer    History of diverticulitis of colon    2002  s/p  sigmoid colectomy   History of multiple pulmonary nodules    hx RUL nodules x2  s/p right VATS w/ wedge resection 09-11-2005 and 06-14-2011  both  necrotizing granulomatous inflammation w/ cystic area of necrosis & focal calcification   History of right breast cancer oncologist-  dr Jana Hakim-- no recurrence   dx 2010--  IDC, Stage IA , ER/PR+,  (pT1c pN0)  11-09-2008 right lumpectomy;  12-18-2008 right simple mastectomy for DCIS margins;  completed chemotherapy 2010 (no radiation) and completed antiestrogen therapy   Hx of colonic polyps    Dr. 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   Nocturia    OA (osteoarthritis)    thumbs   On supplemental oxygen therapy    03-06-2018 per pt uses only at night,  checks O2 sats at home,  stated am sat 89% after moving around average 93-94% with RA   Osteoporosis    Peripheral arterial occlusive disease ([place]) vascular-- dr chen/ dr Donzetta Matters   proximal right SFA severe focal stenosis with collaterals from the PFA/  04/ 2012 occluded celiac and SMA arteries with distal reconstitution w/ patent IMA   Peripheral vascular disease ([place])    chronic DVT RLE,  mesenteric vascular disease   Right lower lobe pulmonary nodule    Chest CT 08-29-2017   Stage 3 severe COPD by GOLD classification ([place]) hx frequent exacerbations--   pulmologist-  dr Maryann Alar--  per lov note , dated 12-20-2017, oxyogen 2L is prescribed for use with exertion (but pt only uses mostly at night), O2 sats on RA run the 80s , this day sat 86% RA and with 2L O2 sat 92%   Varicose veins of leg with swelling    varicose vein surgery - Dr. Aleda Grana   Wears glasses    Wears hearing aid in both ears    Allergies  Allergen Reactions   Sulfa Antibiotics Swelling   Social History   Socioeconomic History   Marital status: Married    Spouse name: Not on file   Number of children: 3   Years of education: 12   Highest education level: Not on file  Occupational History   Occupation: beautician    Employer: RETIRED    Comment: retired  Tobacco Use   Smoking status: Former    Packs/day: 0.75    Years: 50.00    Pack years: 37.50    Types: Cigarettes  Quit date: 05/18/2016    Years since quitting: 5.0   Smokeless tobacco: Never   Vaping Use   Vaping Use: Never used  Substance and Sexual Activity   Alcohol use: Yes    Alcohol/week: 10.0 standard drinks    Types: 10 Cans of beer per week   Drug use: No   Sexual activity: Not on file  Other Topics Concern   Not on file  Social History Narrative   HSG, beauty school. Married '69 -60 yrs/widowed; married '79 - 8yr/divorced; married '87.  2 sons - '70, '74, 1 srtep-son; 1 grandchild. 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 SO - good health.  NO history of abuse.  ACP - full code and all heroic measures.    Social Determinants of Health   Financial Resource Strain: Low Risk    Difficulty of Paying Living Expenses: Not hard at all  Food Insecurity: No Food Insecurity   Worried About Charity fundraiser in the Last Year: Never true   [place] in the Last Year: Never true  Transportation Needs: No Transportation Needs   Lack of Transportation (Medical): No   Lack of Transportation (Non-Medical): No  Physical Activity: Inactive   Days of Exercise per Week: 0 days   Minutes of Exercise per Session: 0 min  Stress: No Stress Concern Present   Feeling of Stress : Not at all  Social Connections: Socially Isolated   Frequency of Communication with Friends and Family: Once a week   Frequency of Social Gatherings with Friends and Family: Never   Attends Religious Services: Never   Marine scientist or Organizations: No   Attends Archivist Meetings: Never   Marital Status: Married   Vitals:   05/19/21 1350  BP: 120/70  Pulse: 90  Resp: 16  SpO2: 90%   Body mass index is 20.98 kg/m.  Physical Exam Vitals and nursing note reviewed.  Constitutional:      General: She is not in acute distress.    Appearance: She is well-developed.  HENT:     Head: Normocephalic and atraumatic.     Mouth/Throat:     Mouth: Mucous membranes are moist.     Pharynx: Oropharynx is clear.  Eyes:     Conjunctiva/sclera: Conjunctivae normal.  Cardiovascular:      Rate and Rhythm: Normal rate and regular rhythm.     Heart sounds: No murmur heard.    Comments: DP and PT pulses present, bilateral. Pulmonary:     Effort: Pulmonary effort is normal. No respiratory distress.     Breath sounds: Normal breath sounds.  Abdominal:     Palpations: Abdomen is soft. There is no hepatomegaly or mass.     Tenderness: There is no abdominal tenderness.  Lymphadenopathy:     Cervical: No cervical adenopathy.  Skin:    General: Skin is warm.     Findings: Rash present. No erythema.          Comments: Scratching skin, LLE with excoriated lesion and active bleeding. Dry skin.  Neurological:     General: No focal deficit present.     Mental Status: She is alert and oriented to person, place, and time.     Cranial Nerves: No cranial nerve deficit.     Gait: Gait normal.  Psychiatric:        Mood and Affect: Mood is anxious.     Comments: Well groomed, good eye contact.   ASSESSMENT AND PLAN:  [name] was seen today for medication refill.  Diagnoses and all orders for this visit:  Insomnia, unspecified type Problem is stable but still not well controlled. Some of her chronic medical problem are also aggravating problem, nocturia and pruritus. Taking Furosemide earlier pm may help and better controlling pruritus. Try not to drink fluid 3-4 hours before bedtime. Good sleep hygiene.  Continue Lorazepam 2 mg at bedtime. Trazodone to be wean off. Doxepin added today, instructed starting 3 mg and titrate as tolerated to 6 mg and 10 mg. Some side effects discussed as well as the risk of medication interactions.  -     doxepin (SINEQUAN) 10 MG/ML solution; Take 0.3-1 mLs (3-10 mg total) by mouth at bedtime. -     LORazepam (ATIVAN) 2 MG tablet; Take 1 tablet (2 mg total) by mouth at bedtime.  Gastroesophageal reflux disease, unspecified whether esophagitis present Problem is not well controlled. Stop Protonix. OTC Nexium has helped, so 40 mg  recommended. GERD precautions.  -     esomeprazole (NEXIUM) 40 MG capsule; Take 1 capsule (40 mg total) by mouth daily.  Major depressive disorder, single episode, moderate (HCC) Stable. Continue Paroxetine 20 mg daily. Instructed about warning signs.  Essential (hemorrhagic) thrombocythemia ([place]) Following with oncologist/hematologist.  Anxiety disorder, unspecified type Continue Lorazepam 2 mg at bedtime and Paroxetine 20 mg daily. Trazodone to be weaned off. We discussed some side effects of medications and the importance of following at least q 6 months,(sooner if changes are made)  before if needed.  -     LORazepam (ATIVAN) 2 MG tablet; Take 1 tablet (2 mg total) by mouth at bedtime.  HFrEF (heart failure with reduced ejection fraction) (HCC) Continue Furosemide 20 mg bid. Low salt diet to continue. Due to episodes pf hypotension she is not on BB,ACEI,or ARB's. May benefit from SGLT2 inhibitor.  Chronic pruritic rash in adult Reporting some improvement. Doxepin may help with pruritus, which is also interfering with sleep. Daily moisturizer may help. Following with dermatologist.  -     doxepin (SINEQUAN) 10 MG/ML solution; Take 0.3-1 mLs (3-10 mg total) by mouth at bedtime.  I spent a total of 42 minutes in both face to face and non face to face activities for this visit on the date of this encounter. During this time history was obtained and documented, examination was performed, prior labs/imaging reviewed, and assessment/plan discussed.  Return in about 2 months (around 07/19/2021).   Sabas Frett G. [place], MD  [facility] [place] office.

## 2021-06-15 ENCOUNTER — Other Ambulatory Visit: Payer: Self-pay | Admitting: *Deleted

## 2021-06-15 DIAGNOSIS — I2510 Atherosclerotic heart disease of native coronary artery without angina pectoris: Secondary | ICD-10-CM

## 2021-06-15 DIAGNOSIS — E7800 Pure hypercholesterolemia, unspecified: Secondary | ICD-10-CM

## 2021-06-15 DIAGNOSIS — I10 Essential (primary) hypertension: Secondary | ICD-10-CM

## 2021-06-15 MED ORDER — ATORVASTATIN CALCIUM 80 MG PO TABS: 80.0000 mg | ORAL_TABLET | Freq: Every day | ORAL | 2 refills | Status: DC

## 2021-06-16 ENCOUNTER — Other Ambulatory Visit: Payer: Self-pay | Admitting: Family Medicine

## 2021-06-18 ENCOUNTER — Other Ambulatory Visit: Payer: Self-pay | Admitting: Family Medicine

## 2021-06-20 ENCOUNTER — Telehealth: Payer: Self-pay | Admitting: Family Medicine

## 2021-06-20 ENCOUNTER — Other Ambulatory Visit: Payer: Self-pay | Admitting: Family Medicine

## 2021-06-20 DIAGNOSIS — F419 Anxiety disorder, unspecified: Secondary | ICD-10-CM

## 2021-06-20 MED ORDER — TRAZODONE HCL 100 MG PO TABS: 100.0000 mg | ORAL_TABLET | Freq: Every day | ORAL | 3 refills | Status: DC

## 2021-06-20 NOTE — Telephone Encounter (Signed)
Patient stated that doxepin (SINEQUAN) 10 MG/ML solution [[id number]] isn't setting well with her.  Patient can be contacted at [phone].  Please advise.

## 2021-06-20 NOTE — Telephone Encounter (Signed)
If Doxepin did not help, she can go back to Trazodone 100 mg at bedtime. We need to be cautious because the risk of interaction with some of her medications (Paroxetine mainly) but she has tolerated well before. Thanks, BJ

## 2021-06-20 NOTE — Telephone Encounter (Signed)
I spoke with patient again, she was only able to tolerate the 3mg  of the doxepin. Pt would like to go back to her trazadone as she was sleeping better with that. Will confirm with pcp and send a new rx in for patient.

## 2021-06-20 NOTE — Telephone Encounter (Signed)
Med list updated - Rx sent in.

## 2021-06-20 NOTE — Telephone Encounter (Signed)
I called and spoke with patient. She has not been able to sleep with the Doxepin, but it does help to relax her. Patient would like to go back on the trazadone as it helped her sleep better. Advised patient that I will discuss the dosage for the trazadone with pcp once she comes out of the exam room and give her a return call, patient verbalized understanding.

## 2021-06-25 ENCOUNTER — Other Ambulatory Visit: Payer: Self-pay | Admitting: Family Medicine

## 2021-07-08 ENCOUNTER — Other Ambulatory Visit: Payer: Self-pay | Admitting: Vascular Surgery

## 2021-07-16 ENCOUNTER — Other Ambulatory Visit: Payer: Self-pay | Admitting: Family Medicine

## 2021-07-19 ENCOUNTER — Encounter: Payer: Self-pay | Admitting: Family Medicine

## 2021-07-19 ENCOUNTER — Other Ambulatory Visit: Payer: Self-pay

## 2021-07-19 ENCOUNTER — Ambulatory Visit (INDEPENDENT_AMBULATORY_CARE_PROVIDER_SITE_OTHER): Payer: Medicare Other | Admitting: Family Medicine

## 2021-07-19 VITALS — BP 126/70 | HR 82 | Resp 16 | Ht 66.0 in | Wt 130.4 lb

## 2021-07-19 DIAGNOSIS — Z23 Encounter for immunization: Secondary | ICD-10-CM | POA: Diagnosis not present

## 2021-07-19 DIAGNOSIS — R7303 Prediabetes: Secondary | ICD-10-CM

## 2021-07-19 DIAGNOSIS — E039 Hypothyroidism, unspecified: Secondary | ICD-10-CM | POA: Diagnosis not present

## 2021-07-19 DIAGNOSIS — E876 Hypokalemia: Secondary | ICD-10-CM | POA: Diagnosis not present

## 2021-07-19 DIAGNOSIS — I700 Atherosclerosis of aorta: Secondary | ICD-10-CM

## 2021-07-19 DIAGNOSIS — G4700 Insomnia, unspecified: Secondary | ICD-10-CM | POA: Diagnosis not present

## 2021-07-19 LAB — POCT GLYCOSYLATED HEMOGLOBIN (HGB A1C): HbA1c, POC (prediabetic range): 5.7 % (ref 5.7–6.4)

## 2021-07-19 NOTE — Patient Instructions (Addendum)
A few things to remember from today's visit:   Need for influenza vaccination - Plan: Flu Vaccine QUAD High Dose(Fluad)  Prediabetes - Plan: POC HgB A1c  Insomnia, unspecified type  Hypothyroidism, unspecified type - Plan: TSH  Hypokalemia - Plan: Basic metabolic panel  Atherosclerosis of aorta ([place])  If you need refills please call your pharmacy. Do not use My Chart to request refills or for acute issues that need immediate attention.   No changes today. Today we are checking potassium and thyroid. I will see you back in 5-6 months,before if needed.  Please be sure medication list is accurate. If a new problem present, please set up appointment sooner than planned today.

## 2021-07-19 NOTE — Progress Notes (Signed)
HPI: [name] is a 75 y.o. female with hx of former smoker, hypertension, Gold stage III COPD, chronic hypoxic respiratory failure on 2 L oxygen at baseline, hyperlipidemia, peripheral arterial disease, and HFrEF here today for 2 months follow up.   She was last seen on 05/19/21.  Insomnia and anxiety: Last visit she reported that Trazodone did not seem to be helping as much. We decided to change to Doxepin.She called a few weeks later to let us know that Doxepin was not helping. She is back to Trazodone 100 mg at bedtime.  In general Trazodone and Lorazepam 2 mg at bedtime help her to get a better sleep. She has been on Lorazepam for many years.  Skin pruritus and nocturia also interfere with sleep. Sleeping about 2-3 hours at the time, wakes up a few times through the night and difficulty going back to sleep.  Hx of bladder cancer, she follows with urologist regularly.  Paroxetine 20 mg still helping with anxiety and depression. She denies side effects.  -Eczema and arthralgias have improved with Dupixent.  She is concerned about elevated glucose. 03/10/21 glucose was 227. HgA1C 5.8. Negative  for abdominal pain, nausea,vomiting, polydipsia,polyuria, or polyphagia.  Hypothyroidism: She is on Levothyroxine 112 mcg daily. Negative for changes in bowel habits,cold/heat intolerance,or palpitations. Last TSH was 0.43 in 05/2020.  HypoK+: She takes KLOR 1.5 tab bid. She is on Furosemide 40 mg bid. HFrEF: Echo in 05/2020 LVEF 45-50%.  Negative for edema,orthopnea, and PND.  Lab Results  Component Value Date   CREATININE 0.70 03/10/2021   BUN 15 03/10/2021   NA 133 (L) 03/10/2021   K 3.8 03/10/2021   CL 101 03/10/2021   CO2 22 03/10/2021   Aortic atherosclerosis has been seen on chest CTA on 08/14/20. She is on Atorvastatin 80 mg daily,Plavix 75 mg daily,and Aspirin 81 mg daily.  Review of Systems  Constitutional:  Positive for fatigue. Negative for activity change  and appetite change.  HENT:  Negative for mouth sores, nosebleeds and sore throat.   Eyes:  Negative for redness and visual disturbance.  Respiratory:  Negative for cough, shortness of breath and wheezing.   Cardiovascular:  Negative for chest pain.  Neurological:  Negative for syncope, weakness and headaches.  Psychiatric/Behavioral:  Positive for sleep disturbance. Negative for confusion. The patient is nervous/anxious.   Rest of ROS, see pertinent positives sand negatives in HPI  Current Outpatient Medications on File Prior to Visit  Medication Sig Dispense Refill   albuterol (VENTOLIN HFA) 108 (90 Base) MCG/ACT inhaler TAKE 2 PUFFS BY MOUTH EVERY 6 HOURS AS NEEDED FOR WHEEZE OR SHORTNESS OF BREATH (Patient taking differently: Inhale 1 puff into the lungs every 4 (four) hours as needed for wheezing or shortness of breath.) 8.5 each 3   aspirin EC 81 MG tablet Take 1 tablet (81 mg total) by mouth daily. 30 tablet    atorvastatin (LIPITOR) 80 MG tablet Take 1 tablet (80 mg total) by mouth daily. 90 tablet 2   Biotin 10000 MCG TABS Take 10,000 mcg by mouth daily.     budesonide (PULMICORT) 0.5 MG/2ML nebulizer solution Take 2 mLs (0.5 mg total) by nebulization 2 (two) times daily. 20 mL 12   cilostazol (PLETAL) 100 MG tablet TAKE 1 TABLET BY MOUTH TWICE A DAY (Patient taking differently: Take 100 mg by mouth 2 (two) times daily.) 180 tablet 3   clopidogrel (PLAVIX) 75 MG tablet TAKE 1 TABLET BY MOUTH EVERY DAY 90 tablet  3   DUPIXENT 300 MG/2ML SOPN Inject 2 mLs into the skin every 14 (fourteen) days.     esomeprazole (NEXIUM) 40 MG capsule Take 1 capsule (40 mg total) by mouth daily. 90 capsule 1   fluticasone (FLONASE) 50 MCG/ACT nasal spray USE 1 SPRAY INTO EACH NOSTRIL TWICE A DAY 48 mL 1   furosemide (LASIX) 40 MG tablet TAKE 1 TABLET BY MOUTH TWICE A DAY 180 tablet 1   hydrOXYzine (ATARAX/VISTARIL) 25 MG tablet Take 25 mg by mouth daily.     ibuprofen (ADVIL) 200 MG tablet Take 200 mg by  mouth every 6 (six) hours as needed for mild pain.     ipratropium-albuterol (DUONEB) 0.5-2.5 (3) MG/3ML SOLN INHALE 3 MLS INTO THE LUNGS EVERY 6 (SIX) HOURS AS NEEDED (SOB, WHEEZING). 360 mL 4   KLOR-CON M20 20 MEQ tablet TAKE 1 TABLET BY MOUTH THREE TIMES A DAY (Patient taking differently: Take 20 mEq by mouth 3 (three) times daily.) 270 tablet 1   levothyroxine (SYNTHROID) 112 MCG tablet TAKE 1 TABLET BY MOUTH EVERY DAY IN THE MORNING BEFORE BREAKFAST (Patient taking differently: Take 112 mcg by mouth daily before breakfast.) 90 tablet 2   LORazepam (ATIVAN) 2 MG tablet Take 1 tablet (2 mg total) by mouth at bedtime. 30 tablet 3   montelukast (SINGULAIR) 10 MG tablet TAKE 1 TABLET BY MOUTH EVERYDAY AT BEDTIME 90 tablet 1   PARoxetine (PAXIL) 20 MG tablet TAKE 1 TABLET BY MOUTH EVERYDAY AT BEDTIME (Patient taking differently: Take 20 mg by mouth daily.) 90 tablet 1   tamsulosin (FLOMAX) 0.4 MG CAPS capsule TAKE 1 CAPSULE BY MOUTH EVERY DAY 90 capsule 0   traZODone (DESYREL) 100 MG tablet TAKE 1 TABLET BY MOUTH EVERYDAY AT BEDTIME 90 tablet 1   TRELEGY ELLIPTA 200-62.5-25 MCG/INH AEPB TAKE 1 PUFF BY MOUTH EVERY DAY (Patient taking differently: Inhale 1 puff into the lungs daily.) 60 each 5   No current facility-administered medications on file prior to visit.    Past Medical History:  Diagnosis Date   Bilateral leg cramps    Bladder neoplasm    Cancer (HCC)    CHF (congestive heart failure) (HCC)    Chronic deep vein thrombosis (DVT) of right lower extremity ([place])    05/ 2018  chronic non-occlusive DVT RLE   COPD, frequent exacerbations ([place])    03-06-2018 per pt last exacerbation 12/ 2018   Coronary artery disease    cardiologist-  dr Aundra Dubin-- 03-11-2018 er cath , 80% stenosis in the ostial second diagonal   Dyspnea on minimal exertion    Emphysema/COPD ([place])    CAT score- 17   Family history of breast cancer    Family history of colon cancer    Family history of melanoma    Family  history of ovarian cancer    Family history of pancreatic cancer    Fibromyalgia 1995   GERD (gastroesophageal reflux disease)    Hiatal hernia    History of breast cancer    History of diverticulitis of colon    2002  s/p  sigmoid colectomy   History of multiple pulmonary nodules    hx RUL nodules x2  s/p right VATS w/ wedge resection 09-11-2005 and 06-14-2011  both  necrotizing granulomatous inflammation w/ cystic area of necrosis & focal calcification   History of right breast cancer oncologist-  dr Jana Hakim-- no recurrence   dx 2010--  IDC, Stage IA , ER/PR+,  (pT1c pN0) 11-09-2008 right lumpectomy;  12-18-2008 right simple mastectomy for DCIS margins;  completed chemotherapy 2010 (no radiation) and completed antiestrogen therapy   Hx of colonic polyps    Dr. 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   Nocturia    OA (osteoarthritis)    thumbs   On supplemental oxygen therapy    03-06-2018 per pt uses only at night,  checks O2 sats at home,  stated am sat 89% after moving around average 93-94% with RA   Osteoporosis    Peripheral arterial occlusive disease ([place]) vascular-- dr chen/ dr Donzetta Matters   proximal right SFA severe focal stenosis with collaterals from the PFA/  04/ 2012 occluded celiac and SMA arteries with distal reconstitution w/ patent IMA   Peripheral vascular disease ([place])    chronic DVT RLE,  mesenteric vascular disease   Right lower lobe pulmonary nodule    Chest CT 08-29-2017   Stage 3 severe COPD by GOLD classification ([place]) hx frequent exacerbations--   pulmologist-  dr Maryann Alar--  per lov note , dated 12-20-2017, oxyogen 2L is prescribed for use with exertion (but pt only uses mostly at night), O2 sats on RA run the 80s , this day sat 86% RA and with 2L O2 sat 92%   Varicose veins of leg with swelling    varicose vein surgery - Dr. Aleda Grana   Wears glasses     Wears hearing aid in both ears    Allergies  Allergen Reactions   Sulfa Antibiotics Swelling    Social History   Socioeconomic History   Marital status: Married    Spouse name: Not on file   Number of children: 3   Years of education: 12   Highest education level: Not on file  Occupational History   Occupation: Chiropractor: RETIRED    Comment: retired  Tobacco Use   Smoking status: Former    Packs/day: 0.75    Years: 50.00    Pack years: 37.50    Types: Cigarettes    Quit date: 05/18/2016    Years since quitting: 5.1   Smokeless tobacco: Never  Vaping Use   Vaping Use: Never used  Substance and Sexual Activity   Alcohol use: Yes    Alcohol/week: 10.0 standard drinks    Types: 10 Cans of beer per week   Drug use: No   Sexual activity: Not on file  Other Topics Concern   Not on file  Social History Narrative   HSG, beauty school. Married '69 -15 yrs/widowed; married '79 - 49yr/divorced; married '87.  2 sons - '70, '74, 1 srtep-son; 1 grandchild. 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 SO - good health.  NO history of abuse.  ACP - full code and all heroic measures.    Social Determinants of Health   Financial Resource Strain: Low Risk    Difficulty of Paying Living Expenses: Not hard at all  Food Insecurity: No Food Insecurity   Worried About Charity fundraiser in the Last Year: Never true   [place] in the Last Year: Never true  Transportation Needs: No Transportation Needs   Lack of Transportation (Medical): No   Lack of Transportation (Non-Medical): No  Physical Activity: Inactive   Days of Exercise per Week: 0 days   Minutes of Exercise per Session: 0 min  Stress: No Stress Concern Present  Feeling of Stress : Not at all  Social Connections: Socially Isolated   Frequency of Communication with Friends and Family: Once a week   Frequency of Social Gatherings with Friends and Family: Never   Attends Religious Services: Never   Corporate treasurer or Organizations: No   Attends Archivist Meetings: Never   Marital Status: Married   Vitals:   07/19/21 1418  BP: 126/70  Pulse: 82  Resp: 16  SpO2: 94%   Body mass index is 21.04 kg/m.  Physical Exam Vitals and nursing note reviewed.  Constitutional:      General: She is not in acute distress.    Appearance: She is well-developed.  HENT:     Head: Normocephalic and atraumatic.     Mouth/Throat:     Mouth: Mucous membranes are moist.     Pharynx: Oropharynx is clear.  Eyes:     Conjunctiva/sclera: Conjunctivae normal.  Neck:     Vascular: No JVD.  Cardiovascular:     Rate and Rhythm: Normal rate and regular rhythm.     Heart sounds: No murmur heard.    Comments: DP pulses present. Pulmonary:     Effort: Pulmonary effort is normal. No respiratory distress.     Breath sounds: Normal breath sounds.  Abdominal:     Palpations: Abdomen is soft. There is no hepatomegaly or mass.     Tenderness: There is no abdominal tenderness.  Lymphadenopathy:     Cervical: No cervical adenopathy.  Skin:    General: Skin is warm.     Findings: Rash (Maculopapular and scaly scattered on LE's.) present. Rash is not pustular or vesicular.  Neurological:     General: No focal deficit present.     Mental Status: She is alert and oriented to person, place, and time.     Cranial Nerves: No cranial nerve deficit.     Comments: Mildly unstable gait, not assisted.  Psychiatric:     Comments: Well groomed, good eye contact.   ASSESSMENT AND PLAN:  [name] was seen today for follow-up.  Orders Placed This Encounter  Procedures   Flu Vaccine QUAD High Dose(Fluad)   Basic metabolic panel   TSH   POC HgB A1c   Lab Results  Component Value Date   HGBA1C 5.7 07/19/2021   Lab Results  Component Value Date   CREATININE 1.09 07/19/2021   BUN 19 07/19/2021   NA 134 (L) 07/19/2021   K 3.9 07/19/2021   CL 98 07/19/2021   CO2 26 07/19/2021   Lab Results   Component Value Date   TSH 0.44 07/19/2021   Insomnia, unspecified type Stable. Continue Trazodone 100 mg at bedtime and Lorazepam 2 mg daily. Good sleep hygiene. We discussed side effects of medications and the risk of med interaction between Trazodone and Paroxetine.  Prediabetes Continue a healthy life style for diabetes prevention. We will continue monitoring regularly.  Hypothyroidism, unspecified type Problem has been stable. Continue Levothyroxine 112 mcg daily, we will adjust treatment if needed.  Hypokalemia Continue KLOR 20 meq 3 tabs daily. Furosemide side effects discussed. Further recommendations according to BMP result.  Atherosclerosis of aorta (HCC) Associated CAD and PAD. Continue Atorvastatin 80 mg daily,Plavix 75 mg daily,and Aspirin 81 mg daily.  Need for influenza vaccination -     Flu Vaccine QUAD High Dose(Fluad)  Return in about 25 weeks (around 01/10/2022).   Aldora Perman G. [place], MD  [facility]. [facility] office.

## 2021-07-20 ENCOUNTER — Encounter: Payer: Self-pay | Admitting: Family Medicine

## 2021-07-20 LAB — BASIC METABOLIC PANEL
BUN: 19 mg/dL (ref 6–23)
CO2: 26 mEq/L (ref 19–32)
Calcium: 9.6 mg/dL (ref 8.4–10.5)
Chloride: 98 mEq/L (ref 96–112)
Creatinine, Ser: 1.09 mg/dL (ref 0.40–1.20)
GFR: 49.29 mL/min — ABNORMAL LOW (ref >60.00)
Glucose, Bld: 64 mg/dL — ABNORMAL LOW (ref 70–99)
Potassium: 3.9 mEq/L (ref 3.5–5.1)
Sodium: 134 mEq/L — ABNORMAL LOW (ref 135–145)

## 2021-07-20 LAB — TSH: TSH: 0.44 u[IU]/mL (ref 0.35–5.50)

## 2021-07-21 ENCOUNTER — Other Ambulatory Visit: Payer: Self-pay | Admitting: Internal Medicine

## 2021-07-31 ENCOUNTER — Encounter (HOSPITAL_COMMUNITY): Payer: Self-pay | Admitting: Emergency Medicine

## 2021-07-31 ENCOUNTER — Emergency Department (HOSPITAL_COMMUNITY): Payer: Medicare Other

## 2021-07-31 ENCOUNTER — Other Ambulatory Visit: Payer: Self-pay

## 2021-07-31 ENCOUNTER — Inpatient Hospital Stay (HOSPITAL_COMMUNITY)
Admission: EM | Admit: 2021-07-31 | Discharge: 2021-08-08 | DRG: 871 | Disposition: A | Discharge status: Home / Self Care | Payer: Medicare Other | Attending: Family Medicine | Admitting: Family Medicine

## 2021-07-31 DIAGNOSIS — M810 Age-related osteoporosis without current pathological fracture: Secondary | ICD-10-CM | POA: Diagnosis present

## 2021-07-31 DIAGNOSIS — Z9011 Acquired absence of right breast and nipple: Secondary | ICD-10-CM

## 2021-07-31 DIAGNOSIS — I739 Peripheral vascular disease, unspecified: Secondary | ICD-10-CM | POA: Diagnosis present

## 2021-07-31 DIAGNOSIS — I959 Hypotension, unspecified: Secondary | ICD-10-CM | POA: Diagnosis present

## 2021-07-31 DIAGNOSIS — I110 Hypertensive heart disease with heart failure: Secondary | ICD-10-CM | POA: Diagnosis present

## 2021-07-31 DIAGNOSIS — G4700 Insomnia, unspecified: Secondary | ICD-10-CM | POA: Diagnosis present

## 2021-07-31 DIAGNOSIS — F32A Depression, unspecified: Secondary | ICD-10-CM | POA: Diagnosis present

## 2021-07-31 DIAGNOSIS — Z66 Do not resuscitate: Secondary | ICD-10-CM | POA: Diagnosis present

## 2021-07-31 DIAGNOSIS — F419 Anxiety disorder, unspecified: Secondary | ICD-10-CM | POA: Diagnosis present

## 2021-07-31 DIAGNOSIS — Z825 Family history of asthma and other chronic lower respiratory diseases: Secondary | ICD-10-CM

## 2021-07-31 DIAGNOSIS — K551 Chronic vascular disorders of intestine: Secondary | ICD-10-CM | POA: Diagnosis present

## 2021-07-31 DIAGNOSIS — Z803 Family history of malignant neoplasm of breast: Secondary | ICD-10-CM

## 2021-07-31 DIAGNOSIS — J9621 Acute and chronic respiratory failure with hypoxia: Secondary | ICD-10-CM | POA: Diagnosis present

## 2021-07-31 DIAGNOSIS — Z86010 Personal history of colonic polyps: Secondary | ICD-10-CM

## 2021-07-31 DIAGNOSIS — R0603 Acute respiratory distress: Secondary | ICD-10-CM

## 2021-07-31 DIAGNOSIS — E876 Hypokalemia: Secondary | ICD-10-CM | POA: Diagnosis present

## 2021-07-31 DIAGNOSIS — I5022 Chronic systolic (congestive) heart failure: Secondary | ICD-10-CM

## 2021-07-31 DIAGNOSIS — F17210 Nicotine dependence, cigarettes, uncomplicated: Secondary | ICD-10-CM | POA: Diagnosis present

## 2021-07-31 DIAGNOSIS — Z7982 Long term (current) use of aspirin: Secondary | ICD-10-CM

## 2021-07-31 DIAGNOSIS — F17200 Nicotine dependence, unspecified, uncomplicated: Secondary | ICD-10-CM | POA: Diagnosis present

## 2021-07-31 DIAGNOSIS — A419 Sepsis, unspecified organism: Secondary | ICD-10-CM | POA: Diagnosis not present

## 2021-07-31 DIAGNOSIS — Z8249 Family history of ischemic heart disease and other diseases of the circulatory system: Secondary | ICD-10-CM

## 2021-07-31 DIAGNOSIS — I10 Essential (primary) hypertension: Secondary | ICD-10-CM | POA: Diagnosis present

## 2021-07-31 DIAGNOSIS — D649 Anemia, unspecified: Secondary | ICD-10-CM | POA: Diagnosis present

## 2021-07-31 DIAGNOSIS — Z79890 Hormone replacement therapy (postmenopausal): Secondary | ICD-10-CM

## 2021-07-31 DIAGNOSIS — Z90710 Acquired absence of both cervix and uterus: Secondary | ICD-10-CM

## 2021-07-31 DIAGNOSIS — Z7902 Long term (current) use of antithrombotics/antiplatelets: Secondary | ICD-10-CM

## 2021-07-31 DIAGNOSIS — J439 Emphysema, unspecified: Secondary | ICD-10-CM | POA: Diagnosis present

## 2021-07-31 DIAGNOSIS — E611 Iron deficiency: Secondary | ICD-10-CM | POA: Diagnosis present

## 2021-07-31 DIAGNOSIS — Z720 Tobacco use: Secondary | ICD-10-CM | POA: Diagnosis present

## 2021-07-31 DIAGNOSIS — Z882 Allergy status to sulfonamides status: Secondary | ICD-10-CM

## 2021-07-31 DIAGNOSIS — I5032 Chronic diastolic (congestive) heart failure: Secondary | ICD-10-CM | POA: Diagnosis present

## 2021-07-31 DIAGNOSIS — R7303 Prediabetes: Secondary | ICD-10-CM | POA: Diagnosis present

## 2021-07-31 DIAGNOSIS — Z9841 Cataract extraction status, right eye: Secondary | ICD-10-CM

## 2021-07-31 DIAGNOSIS — Z8551 Personal history of malignant neoplasm of bladder: Secondary | ICD-10-CM

## 2021-07-31 DIAGNOSIS — G4733 Obstructive sleep apnea (adult) (pediatric): Secondary | ICD-10-CM | POA: Diagnosis present

## 2021-07-31 DIAGNOSIS — R059 Cough, unspecified: Secondary | ICD-10-CM

## 2021-07-31 DIAGNOSIS — E039 Hypothyroidism, unspecified: Secondary | ICD-10-CM | POA: Diagnosis present

## 2021-07-31 DIAGNOSIS — J189 Pneumonia, unspecified organism: Secondary | ICD-10-CM | POA: Diagnosis not present

## 2021-07-31 DIAGNOSIS — Z961 Presence of intraocular lens: Secondary | ICD-10-CM | POA: Diagnosis present

## 2021-07-31 DIAGNOSIS — Z9842 Cataract extraction status, left eye: Secondary | ICD-10-CM

## 2021-07-31 DIAGNOSIS — Z20822 Contact with and (suspected) exposure to covid-19: Secondary | ICD-10-CM | POA: Diagnosis present

## 2021-07-31 DIAGNOSIS — Z974 Presence of external hearing-aid: Secondary | ICD-10-CM

## 2021-07-31 DIAGNOSIS — Z7951 Long term (current) use of inhaled steroids: Secondary | ICD-10-CM

## 2021-07-31 DIAGNOSIS — I82501 Chronic embolism and thrombosis of unspecified deep veins of right lower extremity: Secondary | ICD-10-CM | POA: Diagnosis present

## 2021-07-31 DIAGNOSIS — Z79899 Other long term (current) drug therapy: Secondary | ICD-10-CM

## 2021-07-31 DIAGNOSIS — I2510 Atherosclerotic heart disease of native coronary artery without angina pectoris: Secondary | ICD-10-CM | POA: Diagnosis present

## 2021-07-31 DIAGNOSIS — J9611 Chronic respiratory failure with hypoxia: Secondary | ICD-10-CM | POA: Diagnosis present

## 2021-07-31 DIAGNOSIS — Z853 Personal history of malignant neoplasm of breast: Secondary | ICD-10-CM

## 2021-07-31 DIAGNOSIS — K219 Gastro-esophageal reflux disease without esophagitis: Secondary | ICD-10-CM | POA: Diagnosis present

## 2021-07-31 DIAGNOSIS — R300 Dysuria: Secondary | ICD-10-CM | POA: Diagnosis present

## 2021-07-31 DIAGNOSIS — E785 Hyperlipidemia, unspecified: Secondary | ICD-10-CM | POA: Diagnosis present

## 2021-07-31 DIAGNOSIS — I5043 Acute on chronic combined systolic (congestive) and diastolic (congestive) heart failure: Secondary | ICD-10-CM | POA: Diagnosis present

## 2021-07-31 DIAGNOSIS — J441 Chronic obstructive pulmonary disease with (acute) exacerbation: Secondary | ICD-10-CM | POA: Diagnosis present

## 2021-07-31 DIAGNOSIS — M797 Fibromyalgia: Secondary | ICD-10-CM | POA: Diagnosis present

## 2021-07-31 LAB — URINALYSIS, ROUTINE W REFLEX MICROSCOPIC
Bilirubin Urine: NEGATIVE
Glucose, UA: NEGATIVE mg/dL
Hgb urine dipstick: NEGATIVE
Ketones, ur: NEGATIVE mg/dL
Nitrite: NEGATIVE
Protein, ur: NEGATIVE mg/dL
Specific Gravity, Urine: 1.011 (ref 1.005–1.030)
pH: 5 (ref 5.0–8.0)

## 2021-07-31 LAB — COMPREHENSIVE METABOLIC PANEL
ALT: 24 U/L (ref 0–44)
AST: 31 U/L (ref 15–41)
Albumin: 3.7 g/dL (ref 3.5–5.0)
Alkaline Phosphatase: 76 U/L (ref 38–126)
Anion gap: 7 (ref 5–15)
BUN: 16 mg/dL (ref 8–23)
CO2: 26 mmol/L (ref 22–32)
Calcium: 9.2 mg/dL (ref 8.9–10.3)
Chloride: 101 mmol/L (ref 98–111)
Creatinine, Ser: 0.89 mg/dL (ref 0.44–1.00)
GFR, Estimated: >60 mL/min (ref >60)
Glucose, Bld: 140 mg/dL — ABNORMAL HIGH (ref 70–99)
Potassium: 3.4 mmol/L — ABNORMAL LOW (ref 3.5–5.1)
Sodium: 134 mmol/L — ABNORMAL LOW (ref 135–145)
Total Bilirubin: 0.7 mg/dL (ref 0.3–1.2)
Total Protein: 7.6 g/dL (ref 6.5–8.1)

## 2021-07-31 LAB — CBC WITH DIFFERENTIAL/PLATELET
Abs Immature Granulocytes: 0.46 10*3/uL — ABNORMAL HIGH (ref 0.00–0.07)
Basophils Absolute: 0.1 10*3/uL (ref 0.0–0.1)
Basophils Relative: 0 %
Eosinophils Absolute: 0 10*3/uL (ref 0.0–0.5)
Eosinophils Relative: 0 %
HCT: 34.6 % — ABNORMAL LOW (ref 36.0–46.0)
Hemoglobin: 11.5 g/dL — ABNORMAL LOW (ref 12.0–15.0)
Immature Granulocytes: 2 %
Lymphocytes Relative: 3 %
Lymphs Abs: 0.8 10*3/uL (ref 0.7–4.0)
MCH: 26.9 pg (ref 26.0–34.0)
MCHC: 33.2 g/dL (ref 30.0–36.0)
MCV: 80.8 fL (ref 80.0–100.0)
Monocytes Absolute: 1.6 10*3/uL — ABNORMAL HIGH (ref 0.1–1.0)
Monocytes Relative: 6 %
Neutro Abs: 23.5 10*3/uL — ABNORMAL HIGH (ref 1.7–7.7)
Neutrophils Relative %: 89 %
Platelets: 435 10*3/uL — ABNORMAL HIGH (ref 150–400)
RBC: 4.28 MIL/uL (ref 3.87–5.11)
RDW: 16.1 % — ABNORMAL HIGH (ref 11.5–15.5)
WBC: 26.3 10*3/uL — ABNORMAL HIGH (ref 4.0–10.5)
nRBC: 0 % (ref 0.0–0.2)

## 2021-07-31 LAB — LACTIC ACID, PLASMA
Lactic Acid, Venous: 1.3 mmol/L (ref 0.5–1.9)
Lactic Acid, Venous: 3 mmol/L (ref 0.5–1.9)

## 2021-07-31 LAB — TROPONIN I (HIGH SENSITIVITY): Troponin I (High Sensitivity): 10 ng/L (ref <18)

## 2021-07-31 LAB — RESP PANEL BY RT-PCR (FLU A&B, COVID) ARPGX2
Influenza A by PCR: NEGATIVE
Influenza B by PCR: NEGATIVE
SARS Coronavirus 2 by RT PCR: NEGATIVE

## 2021-07-31 LAB — MAGNESIUM: Magnesium: 1.7 mg/dL (ref 1.7–2.4)

## 2021-07-31 LAB — LIPASE, BLOOD: Lipase: 26 U/L (ref 11–51)

## 2021-07-31 LAB — PROCALCITONIN: Procalcitonin: 2.17 ng/mL

## 2021-07-31 MED ORDER — IPRATROPIUM-ALBUTEROL 0.5-2.5 (3) MG/3ML IN SOLN
3.0000 mL | Freq: Once | RESPIRATORY_TRACT | Status: AC
  Administered 2021-07-31: 3 mL via RESPIRATORY_TRACT
  Filled 2021-07-31: qty 3

## 2021-07-31 MED ORDER — UMECLIDINIUM BROMIDE 62.5 MCG/INH IN AEPB
1.0000 | INHALATION_SPRAY | Freq: Every day | RESPIRATORY_TRACT | Status: DC
  Administered 2021-08-01 – 2021-08-08 (×8): 1 via RESPIRATORY_TRACT
  Filled 2021-07-31 (×2): qty 7

## 2021-07-31 MED ORDER — ONDANSETRON HCL 4 MG/2ML IJ SOLN
4.0000 mg | Freq: Once | INTRAMUSCULAR | Status: AC
  Administered 2021-07-31: 4 mg via INTRAVENOUS
  Filled 2021-07-31: qty 2

## 2021-07-31 MED ORDER — FLUTICASONE-UMECLIDIN-VILANT 200-62.5-25 MCG/INH IN AEPB: 1.0000 | INHALATION_SPRAY | Freq: Every day | RESPIRATORY_TRACT | Status: DC

## 2021-07-31 MED ORDER — LACTATED RINGERS IV BOLUS
500.0000 mL | Freq: Once | INTRAVENOUS | Status: AC
  Administered 2021-07-31: 500 mL via INTRAVENOUS

## 2021-07-31 MED ORDER — SODIUM CHLORIDE 0.9% FLUSH: 3.0000 mL | INTRAVENOUS | Status: DC | PRN

## 2021-07-31 MED ORDER — HYDROXYZINE HCL 25 MG PO TABS
25.0000 mg | ORAL_TABLET | Freq: Every day | ORAL | Status: DC
  Administered 2021-07-31 – 2021-08-07 (×8): 25 mg via ORAL
  Filled 2021-07-31 (×8): qty 1

## 2021-07-31 MED ORDER — LEVOTHYROXINE SODIUM 112 MCG PO TABS
112.0000 ug | ORAL_TABLET | Freq: Every day | ORAL | Status: DC
  Administered 2021-08-01 – 2021-08-08 (×8): 112 ug via ORAL
  Filled 2021-07-31 (×8): qty 1

## 2021-07-31 MED ORDER — MORPHINE SULFATE (PF) 4 MG/ML IV SOLN
4.0000 mg | Freq: Once | INTRAVENOUS | Status: AC
  Administered 2021-07-31: 4 mg via INTRAVENOUS
  Filled 2021-07-31: qty 1

## 2021-07-31 MED ORDER — SODIUM CHLORIDE 0.9 % IV SOLN
500.0000 mg | INTRAVENOUS | Status: DC
  Administered 2021-08-01 – 2021-08-04 (×4): 500 mg via INTRAVENOUS
  Filled 2021-07-31 (×4): qty 500

## 2021-07-31 MED ORDER — TAMSULOSIN HCL 0.4 MG PO CAPS
0.4000 mg | ORAL_CAPSULE | Freq: Every day | ORAL | Status: DC
  Administered 2021-07-31 – 2021-08-07 (×8): 0.4 mg via ORAL
  Filled 2021-07-31 (×9): qty 1

## 2021-07-31 MED ORDER — FLUTICASONE FUROATE-VILANTEROL 100-25 MCG/INH IN AEPB
1.0000 | INHALATION_SPRAY | Freq: Every day | RESPIRATORY_TRACT | Status: DC
  Administered 2021-08-01 – 2021-08-08 (×8): 1 via RESPIRATORY_TRACT
  Filled 2021-07-31: qty 28

## 2021-07-31 MED ORDER — SODIUM CHLORIDE 0.9% FLUSH
3.0000 mL | Freq: Two times a day (BID) | INTRAVENOUS | Status: DC
  Administered 2021-07-31 – 2021-08-08 (×16): 3 mL via INTRAVENOUS

## 2021-07-31 MED ORDER — SODIUM CHLORIDE 0.9 % IV SOLN
500.0000 mg | Freq: Once | INTRAVENOUS | Status: AC
  Administered 2021-07-31: 500 mg via INTRAVENOUS
  Filled 2021-07-31: qty 500

## 2021-07-31 MED ORDER — ENOXAPARIN SODIUM 40 MG/0.4ML IJ SOSY
40.0000 mg | PREFILLED_SYRINGE | INTRAMUSCULAR | Status: DC
  Administered 2021-07-31 – 2021-08-07 (×8): 40 mg via SUBCUTANEOUS
  Filled 2021-07-31 (×8): qty 0.4

## 2021-07-31 MED ORDER — PAROXETINE HCL 20 MG PO TABS
20.0000 mg | ORAL_TABLET | Freq: Every day | ORAL | Status: DC
  Administered 2021-07-31 – 2021-08-07 (×8): 20 mg via ORAL
  Filled 2021-07-31 (×8): qty 1

## 2021-07-31 MED ORDER — IOHEXOL 350 MG/ML SOLN
80.0000 mL | Freq: Once | INTRAVENOUS | Status: AC | PRN
  Administered 2021-07-31: 80 mL via INTRAVENOUS

## 2021-07-31 MED ORDER — FUROSEMIDE 40 MG PO TABS: 40.0000 mg | ORAL_TABLET | Freq: Two times a day (BID) | ORAL | Status: DC

## 2021-07-31 MED ORDER — CLOPIDOGREL BISULFATE 75 MG PO TABS
75.0000 mg | ORAL_TABLET | Freq: Every day | ORAL | Status: DC
  Administered 2021-07-31 – 2021-08-08 (×9): 75 mg via ORAL
  Filled 2021-07-31 (×9): qty 1

## 2021-07-31 MED ORDER — SENNOSIDES-DOCUSATE SODIUM 8.6-50 MG PO TABS: 1.0000 | ORAL_TABLET | Freq: Every evening | ORAL | Status: DC | PRN

## 2021-07-31 MED ORDER — IPRATROPIUM-ALBUTEROL 0.5-2.5 (3) MG/3ML IN SOLN
3.0000 mL | Freq: Four times a day (QID) | RESPIRATORY_TRACT | Status: DC | PRN
  Administered 2021-08-01 – 2021-08-03 (×4): 3 mL via RESPIRATORY_TRACT
  Filled 2021-07-31 (×4): qty 3

## 2021-07-31 MED ORDER — SODIUM CHLORIDE 0.9 % IV SOLN
250.0000 mL | INTRAVENOUS | Status: DC | PRN
  Administered 2021-08-05: 250 mL via INTRAVENOUS

## 2021-07-31 MED ORDER — FLUTICASONE PROPIONATE 50 MCG/ACT NA SUSP
1.0000 | Freq: Two times a day (BID) | NASAL | Status: DC
  Administered 2021-07-31 – 2021-08-08 (×15): 1 via NASAL
  Filled 2021-07-31 (×3): qty 16

## 2021-07-31 MED ORDER — PANTOPRAZOLE SODIUM 40 MG PO TBEC
80.0000 mg | DELAYED_RELEASE_TABLET | Freq: Every day | ORAL | Status: DC
  Administered 2021-08-01 – 2021-08-08 (×8): 80 mg via ORAL
  Filled 2021-07-31 (×9): qty 2

## 2021-07-31 MED ORDER — ACETAMINOPHEN 325 MG PO TABS: 650.0000 mg | ORAL_TABLET | Freq: Four times a day (QID) | ORAL | Status: DC | PRN

## 2021-07-31 MED ORDER — SODIUM CHLORIDE 0.9 % IV SOLN
1.0000 g | Freq: Once | INTRAVENOUS | Status: AC
  Administered 2021-07-31: 1 g via INTRAVENOUS
  Filled 2021-07-31: qty 10

## 2021-07-31 MED ORDER — LORAZEPAM 1 MG PO TABS
2.0000 mg | ORAL_TABLET | Freq: Every day | ORAL | Status: DC
  Administered 2021-07-31: 2 mg via ORAL
  Filled 2021-07-31: qty 2

## 2021-07-31 MED ORDER — GUAIFENESIN ER 600 MG PO TB12
600.0000 mg | ORAL_TABLET | Freq: Every day | ORAL | Status: DC
  Administered 2021-07-31 – 2021-08-08 (×9): 600 mg via ORAL
  Filled 2021-07-31 (×9): qty 1

## 2021-07-31 MED ORDER — BUDESONIDE 0.5 MG/2ML IN SUSP
0.5000 mg | Freq: Two times a day (BID) | RESPIRATORY_TRACT | Status: DC
  Administered 2021-08-01 – 2021-08-08 (×15): 0.5 mg via RESPIRATORY_TRACT
  Filled 2021-07-31 (×15): qty 2

## 2021-07-31 MED ORDER — ACETAMINOPHEN 650 MG RE SUPP: 650.0000 mg | Freq: Four times a day (QID) | RECTAL | Status: DC | PRN

## 2021-07-31 MED ORDER — TRAZODONE HCL 100 MG PO TABS
100.0000 mg | ORAL_TABLET | Freq: Every day | ORAL | Status: DC
  Administered 2021-07-31 – 2021-08-07 (×8): 100 mg via ORAL
  Filled 2021-07-31: qty 2
  Filled 2021-07-31: qty 1
  Filled 2021-07-31: qty 2
  Filled 2021-07-31: qty 1
  Filled 2021-07-31 (×2): qty 2
  Filled 2021-07-31: qty 1
  Filled 2021-07-31: qty 2

## 2021-07-31 MED ORDER — SODIUM CHLORIDE 0.9 % IV SOLN
1.0000 g | INTRAVENOUS | Status: DC
  Administered 2021-08-01 – 2021-08-07 (×7): 1 g via INTRAVENOUS
  Filled 2021-07-31 (×7): qty 10

## 2021-07-31 MED ORDER — ASPIRIN EC 81 MG PO TBEC
81.0000 mg | DELAYED_RELEASE_TABLET | Freq: Every day | ORAL | Status: DC
  Administered 2021-07-31 – 2021-08-08 (×9): 81 mg via ORAL
  Filled 2021-07-31 (×9): qty 1

## 2021-07-31 MED ORDER — HYDROCODONE-ACETAMINOPHEN 5-325 MG PO TABS
1.0000 | ORAL_TABLET | ORAL | Status: DC | PRN
  Administered 2021-07-31 – 2021-08-04 (×5): 2 via ORAL
  Filled 2021-07-31 (×5): qty 2

## 2021-07-31 MED ORDER — SODIUM CHLORIDE 0.9 % IV BOLUS
1000.0000 mL | Freq: Once | INTRAVENOUS | Status: AC
  Administered 2021-07-31: 1000 mL via INTRAVENOUS

## 2021-07-31 MED ORDER — ATORVASTATIN CALCIUM 40 MG PO TABS
80.0000 mg | ORAL_TABLET | Freq: Every day | ORAL | Status: DC
  Administered 2021-07-31 – 2021-08-08 (×9): 80 mg via ORAL
  Filled 2021-07-31 (×10): qty 2

## 2021-07-31 MED ORDER — IPRATROPIUM-ALBUTEROL 0.5-2.5 (3) MG/3ML IN SOLN
3.0000 mL | Freq: Four times a day (QID) | RESPIRATORY_TRACT | Status: AC
Start: 2021-07-31 — End: 2021-08-01
  Administered 2021-07-31: 3 mL via RESPIRATORY_TRACT
  Filled 2021-07-31 (×3): qty 3

## 2021-07-31 MED ORDER — POTASSIUM CHLORIDE CRYS ER 20 MEQ PO TBCR
20.0000 meq | EXTENDED_RELEASE_TABLET | Freq: Three times a day (TID) | ORAL | Status: DC
  Administered 2021-07-31 – 2021-08-01 (×2): 20 meq via ORAL
  Filled 2021-07-31 (×2): qty 1

## 2021-07-31 MED ORDER — MONTELUKAST SODIUM 10 MG PO TABS
10.0000 mg | ORAL_TABLET | Freq: Every day | ORAL | Status: DC
  Administered 2021-07-31 – 2021-08-07 (×8): 10 mg via ORAL
  Filled 2021-07-31 (×8): qty 1

## 2021-07-31 MED ORDER — POTASSIUM CHLORIDE CRYS ER 10 MEQ PO TBCR
10.0000 meq | EXTENDED_RELEASE_TABLET | Freq: Once | ORAL | Status: AC
  Administered 2021-07-31: 10 meq via ORAL
  Filled 2021-07-31: qty 1

## 2021-07-31 MED ORDER — CILOSTAZOL 100 MG PO TABS
100.0000 mg | ORAL_TABLET | Freq: Two times a day (BID) | ORAL | Status: DC
  Administered 2021-08-01 – 2021-08-08 (×15): 100 mg via ORAL
  Filled 2021-07-31 (×20): qty 1

## 2021-07-31 MED ORDER — ALBUTEROL SULFATE HFA 108 (90 BASE) MCG/ACT IN AERS
1.0000 | INHALATION_SPRAY | RESPIRATORY_TRACT | Status: DC | PRN
  Filled 2021-07-31: qty 6.7

## 2021-07-31 NOTE — ED Triage Notes (Signed)
Pt arrived via POV with left sided flank pain and lower back pain. Pt states her left kidney has been hurting the past 2 days and she can't breathe without hurting. Pt reports her PCP told her she has a low kidney function and she has had a burning sensation when urinating for awhile. Hx of kidney stones years ago.

## 2021-07-31 NOTE — ED Provider Notes (Signed)
[facility] DEPT Provider Note   CSN: [id number] Arrival date & time: 07/31/21  1005     History Chief Complaint  Patient presents with   Flank Pain   Back Pain    [name] is a 75 y.o. female.  75 yo F with a chief complaints of left sided chest pain.  This is been going on for couple days.  She has been having some pain off and on.  Usually worse when she tries to take a deep breath.  Denies any significant change in cough denies hemoptysis denies history of PE or DVT.  She has been able to eat and drink without issue.  Saw her family doctor about a week ago and had blood work obtained.  She does not feel like her breathing is any worse than normal.  She denies rash denies trauma.  Denies lower extremity edema.  The history is provided by the patient.  Flank Pain This is a new problem. The current episode started 2 days ago. The problem occurs constantly. The problem has not changed since onset.Associated symptoms include chest pain. Pertinent negatives include no headaches and no shortness of breath. Exacerbated by: breathing. Nothing relieves the symptoms. She has tried nothing for the symptoms. The treatment provided no relief.  Back Pain Associated symptoms: chest pain   Associated symptoms: no dysuria, no fever and no headaches       Past Medical History:  Diagnosis Date   Bilateral leg cramps    Bladder neoplasm    Cancer (HCC)    CHF (congestive heart failure) (HCC)    Chronic deep vein thrombosis (DVT) of right lower extremity ([place])    05/ 2018  chronic non-occlusive DVT RLE   COPD, frequent exacerbations ([place])    03-06-2018 per pt last exacerbation 12/ 2018   Coronary artery disease    cardiologist-  dr Aundra Dubin-- 03-11-2018 er cath , 80% stenosis in the ostial second diagonal   Dyspnea on minimal exertion    Emphysema/COPD ([place])    CAT score- 17   Family history of breast cancer    Family history of colon cancer    Family  history of melanoma    Family history of ovarian cancer    Family history of pancreatic cancer    Fibromyalgia 1995   GERD (gastroesophageal reflux disease)    Hiatal hernia    History of breast cancer    History of diverticulitis of colon    2002  s/p  sigmoid colectomy   History of multiple pulmonary nodules    hx RUL nodules x2  s/p right VATS w/ wedge resection 09-11-2005 and 06-14-2011  both  necrotizing granulomatous inflammation w/ cystic area of necrosis & focal calcification   History of right breast cancer oncologist-  dr Jana Hakim-- no recurrence   dx 2010--  IDC, Stage IA , ER/PR+,  (pT1c pN0) 11-09-2008 right lumpectomy;  12-18-2008 right simple mastectomy for DCIS margins;  completed chemotherapy 2010 (no radiation) and completed antiestrogen therapy   Hx of colonic polyps    Dr. 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   Nocturia    OA (osteoarthritis)    thumbs   On supplemental oxygen therapy    03-06-2018 per pt uses only at night,  checks O2 sats at home,  stated am sat 89% after moving around  average 93-94% with RA   Osteoporosis    Peripheral arterial occlusive disease ([place]) vascular-- dr chen/ dr Donzetta Matters   proximal right SFA severe focal stenosis with collaterals from the PFA/  04/ 2012 occluded celiac and SMA arteries with distal reconstitution w/ patent IMA   Peripheral vascular disease ([place])    chronic DVT RLE,  mesenteric vascular disease   Right lower lobe pulmonary nodule    Chest CT 08-29-2017   Stage 3 severe COPD by GOLD classification ([place]) hx frequent exacerbations--   pulmologist-  dr Maryann Alar--  per lov note , dated 12-20-2017, oxyogen 2L is prescribed for use with exertion (but pt only uses mostly at night), O2 sats on RA run the 80s , this day sat 86% RA and with 2L O2 sat 92%   Varicose veins of leg with swelling    varicose vein surgery -  Dr. Aleda Grana   Wears glasses    Wears hearing aid in both ears     Patient Active Problem List   Diagnosis Date Noted   Prediabetes 07/19/2021   Hemoptysis 10/05/2020   Hypokalemia 09/26/2020   OSA (obstructive sleep apnea) 09/11/2020   SOB (shortness of breath)    Acute on chronic systolic CHF (congestive heart failure) ([place])    Acute respiratory failure with hypoxia ([place]) 08/14/2020   Esophageal dysmotility 08/14/2020   Atherosclerosis of aorta ([place]) 08/14/2020   Snoring 06/26/2020   Chronic heart failure with preserved ejection fraction (HFpEF) ([place]) 06/07/2020   Shortness of breath    Acute systolic CHF (congestive heart failure) ([place]) 05/21/2020   Major depressive disorder, single episode, moderate degree ([place]) 05/21/2020   COPD exacerbation ([place]) 05/20/2020   Acute on chronic respiratory failure with hypoxia ([place]) 05/20/2020   Genetic testing 12/12/2018   Family history of breast cancer    Family history of ovarian cancer    Family history of pancreatic cancer    Family history of colon cancer    Family history of melanoma    History of breast cancer    Chronic respiratory failure with hypoxia ([place]) 10/14/2017   COPD with acute exacerbation ([place]) 10/10/2017   Flu vaccine need 09/09/2017   Anxiety disorder 09/09/2017   Fibromyalgia 09/09/2017   Thrombocytosis 06/21/2017   Malignant neoplasm of overlapping sites of right breast in female, estrogen receptor positive ([place]) 06/05/2017   Dermatitis 04/02/2017   Edema of right lower extremity 02/26/2017   Positive ANA (antinuclear antibody) 02/08/2017   Elevated IgE level and eosinophilia 12/31/2016   Oral thrush 11/28/2016   Rash and nonspecific skin eruption 11/28/2016   Allergic rhinitis 11/28/2016   History of bacterial pneumonia 08/09/2016   Routine general medical examination at a health care facility 08/12/2015   Insomnia 03/04/2015   Breast cancer, right breast ([place]) 02/17/2015   CAD (coronary artery  disease) 07/11/2011   Tobacco abuse 03/27/2011   Chronic mesenteric ischemia ([place]) 03/27/2011   Solitary pulmonary nodule 03/27/2011   PAD (peripheral artery disease) ([place]) 03/04/2011   Arthropathy 11/09/2010   Hypothyroidism 09/20/2009   Hyperlipidemia 09/20/2009   Hearing loss 09/14/2008   Essential hypertension 09/08/2007   COPD, frequent exacerbations ([place]) 09/08/2007   GERD 09/08/2007    Past Surgical History:  Procedure Laterality Date   ABDOMINAL AORTOGRAM N/A 07/11/2020   Procedure: ABDOMINAL AORTOGRAM;  Surgeon: Waynetta Sandy, MD;  Location: [facility];  Service: Cardiovascular;  Laterality: N/A;   AORTIC ARCH ANGIOGRAPHY N/A 07/11/2020   Procedure: AORTIC ARCH ANGIOGRAPHY;  Surgeon:  Waynetta Sandy, MD;  Location: [facility];  Service: Cardiovascular;  Laterality: N/A;   BREAST EXCISIONAL BIOPSY Left 10/15/2008   BREAST LUMPECTOMY W/ NEEDLE LOCALIZATION Right 11-09-2008    dr cornett   [place]  03-12-2011   dr Aundra Dubin   70-80% ostial stenosis in small second diagonal (appears to small for intervention),  dLAD 40-50%,  minimal luminal irregulartieis involoving LCFx and RCA,  LVEF 55%   CATARACT EXTRACTION W/ INTRAOCULAR LENS  IMPLANT, BILATERAL  2011   CYSTOSCOPY N/A 03/11/2018   Procedure: CYSTOSCOPY WITH INSTILLATION OF POST OPERATIVE EPIRUBICIN;  Surgeon: Festus Aloe, MD;  Location: [facility];  Service: Urology;  Laterality: N/A;   LEFT HEART CATH AND CORONARY ANGIOGRAPHY N/A 05/25/2020   Procedure: LEFT HEART CATH AND CORONARY ANGIOGRAPHY;  Surgeon: Troy Sine, MD;  Location: [facility];  Service: Cardiovascular;  Laterality: N/A;   MASTECTOMY Right    PARTIAL COLECTOMY  2002   sigmoid and appectomy (diverticulitis)   PARTIAL MASTECTOMY WITH NEEDLE LOCALIZATION Left 02/23/2013   Procedure:  LEFT PARTIAL MASTECTOMY WITH NEEDLE LOCALIZATION;  Surgeon: Adin Hector, MD;  Location: [facility];  Service: General;  Laterality: Left;   PERIPHERAL VASCULAR INTERVENTION  07/11/2020   Procedure: PERIPHERAL VASCULAR INTERVENTION;  Surgeon: Waynetta Sandy, MD;  Location: [facility];  Service: Cardiovascular;;   RECONSTRUCTION BREAST W/ LATISSIMUS DORSI FLAP Right 05-30-2009   dr Harlow Mares  [facility]   REDUCTION MAMMAPLASTY Left    SIMPLE MASTECTOMY Right 12-28-2008    dr cornett  [facility]   TRANSTHORACIC ECHOCARDIOGRAM  07-09-2016  dr Aundra Dubin   ef 55-60%, grade 1 diastoic dysfunction/  mild TR   TRANSURETHRAL RESECTION OF BLADDER TUMOR N/A 03/11/2018   Procedure: TRANSURETHRAL RESECTION OF BLADDER TUMOR (TURBT) 2-5cm;  Surgeon: Festus Aloe, MD;  Location: [facility];  Service: Urology;  Laterality: N/A;   UPPER EXTREMITY ANGIOGRAPHY Left 07/11/2020   Procedure: Upper Extremity Angiography;  Surgeon: Waynetta Sandy, MD;  Location: [facility];  Service: Cardiovascular;  Laterality: Left;   VAGINAL HYSTERECTOMY  1973   VIDEO ASSISTED THORACOSCOPY (VATS)/WEDGE RESECTION Right 09-11-2005  &  06-14-2011   dr hendrickso  [facility]   both RUL     OB History   No obstetric history on file.     Family History  Problem Relation Age of Onset   Colon cancer Mother        colon   COPD Mother        brown lung   Hypertension Mother    Diabetes Mother    Emphysema Mother    Coronary artery disease Father    Heart attack Father    Sudden death Father    Heart disease Father    Cancer Sister        throat   Hypertension Sister    Coronary artery disease Brother    Heart attack Brother        early 61s   Throat cancer Sister    Ovarian cancer Sister        fallopian tube cancer in her 59s   Melanoma Sister    Hypertension Sister    Pancreatic cancer Sister    Melanoma Niece        dx in her 47s   Breast cancer Niece 57    Social History   Tobacco Use   Smoking status: Former    Packs/day: 0.75  Years: 50.00    Pack years: 37.50    Types:  Cigarettes    Quit date: 05/18/2016    Years since quitting: 5.2   Smokeless tobacco: Never  Vaping Use   Vaping Use: Never used  Substance Use Topics   Alcohol use: Yes    Alcohol/week: 10.0 standard drinks    Types: 10 Cans of beer per week   Drug use: No    Home Medications Prior to Admission medications   Medication Sig Start Date End Date Taking? Authorizing Provider  albuterol (VENTOLIN HFA) 108 (90 Base) MCG/ACT inhaler TAKE 2 PUFFS BY MOUTH EVERY 6 HOURS AS NEEDED FOR WHEEZE OR SHORTNESS OF BREATH Patient taking differently: Inhale 1 puff into the lungs every 4 (four) hours as needed for wheezing or shortness of breath. 09/05/20   Martyn Ehrich, NP  aspirin EC 81 MG tablet Take 1 tablet (81 mg total) by mouth daily. 08/21/20   Cherene Altes, MD  atorvastatin (LIPITOR) 80 MG tablet Take 1 tablet (80 mg total) by mouth daily. 06/15/21   Freada Bergeron, MD  Biotin 10000 MCG TABS Take 10,000 mcg by mouth daily.    [provider]  budesonide (PULMICORT) 0.5 MG/2ML nebulizer solution Take 2 mLs (0.5 mg total) by nebulization 2 (two) times daily. 09/15/20   Elie Confer, MD  cilostazol (PLETAL) 100 MG tablet TAKE 1 TABLET BY MOUTH TWICE A DAY Patient taking differently: Take 100 mg by mouth 2 (two) times daily. 11/28/20   [name], NP  clopidogrel (PLAVIX) 75 MG tablet TAKE 1 TABLET BY MOUTH EVERY DAY 07/08/21   Waynetta Sandy, MD  DUPIXENT 300 MG/2ML SOPN Inject 2 mLs into the skin every 14 (fourteen) days. 01/03/21   [provider]  esomeprazole (NEXIUM) 40 MG capsule Take 1 capsule (40 mg total) by mouth daily. 05/19/21   [place], [name], MD  fluticasone [facility] Iv) 50 MCG/ACT nasal spray USE 1 SPRAY INTO EACH NOSTRIL TWICE A DAY 06/16/21   [place], [name], MD  Fluticasone-Umeclidin-Vilant (TRELEGY ELLIPTA) 200-62.5-25 MCG/INH AEPB Inhale 1 puff into the lungs daily. 07/21/21   Baird Lyons D, MD  furosemide (LASIX) 40 MG tablet TAKE 1  TABLET BY MOUTH TWICE A DAY 06/26/21   [place], [name], MD  hydrOXYzine (ATARAX/VISTARIL) 25 MG tablet Take 25 mg by mouth daily. 03/31/21   [provider]  ibuprofen (ADVIL) 200 MG tablet Take 200 mg by mouth every 6 (six) hours as needed for mild pain.    [provider]  ipratropium-albuterol (DUONEB) 0.5-2.5 (3) MG/3ML SOLN INHALE 3 MLS INTO THE LUNGS EVERY 6 (SIX) HOURS AS NEEDED (SOB, WHEEZING). 05/09/21   Martyn Ehrich, NP  KLOR-CON M20 20 MEQ tablet TAKE 1 TABLET BY MOUTH THREE TIMES A DAY Patient taking differently: Take 20 mEq by mouth 3 (three) times daily. 02/20/21   [place], [name], MD  levothyroxine (SYNTHROID) 112 MCG tablet TAKE 1 TABLET BY MOUTH EVERY DAY IN THE MORNING BEFORE BREAKFAST Patient taking differently: Take 112 mcg by mouth daily before breakfast. 12/16/20   [place], [name], MD  LORazepam (ATIVAN) 2 MG tablet Take 1 tablet (2 mg total) by mouth at bedtime. 05/19/21   [place], [name], MD  montelukast (SINGULAIR) 10 MG tablet TAKE 1 TABLET BY MOUTH EVERYDAY AT BEDTIME 04/21/21   Martyn Ehrich, NP  PARoxetine (PAXIL) 20 MG tablet TAKE 1 TABLET BY MOUTH EVERYDAY AT BEDTIME Patient taking differently: Take 20 mg  by mouth daily. 02/27/21   [place], [name], MD  tamsulosin (FLOMAX) 0.4 MG CAPS capsule TAKE 1 CAPSULE BY MOUTH EVERY DAY 07/17/21   [place], [name], MD  traZODone (DESYREL) 100 MG tablet TAKE 1 TABLET BY MOUTH EVERYDAY AT BEDTIME 06/20/21   [place], [name], MD    Allergies    Sulfa antibiotics  Review of Systems   Review of Systems  Constitutional:  Negative for chills and fever.  HENT:  Negative for congestion and rhinorrhea.   Eyes:  Negative for redness and visual disturbance.  Respiratory:  Positive for cough (not changed from baseline). Negative for shortness of breath and wheezing.   Cardiovascular:  Positive for chest pain. Negative for palpitations.  Gastrointestinal:  Negative for nausea and vomiting.  Genitourinary:  Positive for  flank pain. Negative for dysuria and urgency.  Musculoskeletal:  Positive for back pain. Negative for arthralgias and myalgias.  Skin:  Negative for pallor and wound.  Neurological:  Negative for dizziness and headaches.   Physical Exam Updated Vital Signs BP (!) 101/53   Pulse 78   Temp 99.5 F (37.5 C) (Oral)   Resp 16   Ht 5\' 6"  (1.676 m)   Wt 59 kg   SpO2 92%   BMI 20.98 kg/m   Physical Exam Vitals and nursing note reviewed.  Constitutional:      General: She is not in acute distress.    Appearance: She is well-developed. She is not diaphoretic.  HENT:     Head: Normocephalic and atraumatic.  Eyes:     Pupils: Pupils are equal, round, and reactive to light.  Cardiovascular:     Rate and Rhythm: Regular rhythm. Tachycardia present.     Heart sounds: No murmur heard.   No friction rub. No gallop.  Pulmonary:     Effort: Pulmonary effort is normal.     Breath sounds: No wheezing or rales.     Comments: Diminished in all fields. Abdominal:     General: There is no distension.     Palpations: Abdomen is soft.     Tenderness: There is no abdominal tenderness.  Musculoskeletal:        General: No tenderness.     Cervical back: Normal range of motion and neck supple.  Skin:    General: Skin is warm and dry.  Neurological:     Mental Status: She is alert and oriented to person, place, and time.  Psychiatric:        Behavior: Behavior normal.    ED Results / Procedures / Treatments   Labs (all labs ordered are listed, but only abnormal results are displayed) Labs Reviewed  CBC WITH DIFFERENTIAL/PLATELET - Abnormal; Notable for the following components:      Result Value   WBC 26.3 (*)    Hemoglobin 11.5 (*)    HCT 34.6 (*)    RDW 16.1 (*)    Platelets 435 (*)    Neutro Abs 23.5 (*)    Monocytes Absolute 1.6 (*)    Abs Immature Granulocytes 0.46 (*)    All other components within normal limits  COMPREHENSIVE METABOLIC PANEL - Abnormal; Notable for the  following components:   Sodium 134 (*)    Potassium 3.4 (*)    Glucose, Bld 140 (*)    All other components within normal limits  URINALYSIS, ROUTINE W REFLEX MICROSCOPIC - Abnormal; Notable for the following components:   Leukocytes,Ua TRACE (*)    Bacteria, UA RARE (*)  All other components within normal limits  RESP PANEL BY RT-PCR (FLU A&B, COVID) ARPGX2  LIPASE, BLOOD  TROPONIN I (HIGH SENSITIVITY)    EKG None  Radiology CT Angio Chest PE W and/or Wo Contrast  Result Date: 07/31/2021 CLINICAL DATA:  PE suspected, high prob EXAM: CT ANGIOGRAPHY CHEST WITH CONTRAST TECHNIQUE: Multidetector CT imaging of the chest was performed using the standard protocol during bolus administration of intravenous contrast. Multiplanar CT image reconstructions and MIPs were obtained to evaluate the vascular anatomy. CONTRAST:  21mL OMNIPAQUE IOHEXOL 350 MG/ML SOLN COMPARISON:  08/14/2020 FINDINGS: Cardiovascular: Satisfactory opacification of the pulmonary arteries to the segmental level. No evidence of pulmonary embolism. Normal heart size. No pericardial effusion. Coronary artery calcification. Thoracic aorta is normal in caliber with mixed plaque. Mediastinum/Nodes: No enlarged lymph nodes.  Hiatal hernia. Lungs/Pleura: Emphysema. Bilateral apical scarring. Postoperative changes right upper lobe. Lingular consolidation. No pleural effusion. No pneumothorax. Upper Abdomen: Dictated separately. Musculoskeletal: Right mastectomy with prosthesis. Degenerative changes of the included spine. Review of the MIP images confirms the above findings. IMPRESSION: No evidence of acute pulmonary embolism. Lingular consolidation suspicious for pneumonia. Emphysema. Coronary and aortic atherosclerosis. Electronically Signed   By: Macy Mis M.D.   On: 07/31/2021 14:46   CT ABDOMEN PELVIS W CONTRAST  Result Date: 07/31/2021 CLINICAL DATA:  Left upper quadrant abdominal pain EXAM: CT ABDOMEN AND PELVIS WITH  CONTRAST TECHNIQUE: Multidetector CT imaging of the abdomen and pelvis was performed using the standard protocol following bolus administration of intravenous contrast. CONTRAST:  31mL OMNIPAQUE IOHEXOL 350 MG/ML SOLN COMPARISON:  12/01/2019 FINDINGS: Lower chest: Dictated separately. Hepatobiliary: No focal liver abnormality. Probable punctate layering gallstone or tiny polyp. No biliary dilatation. Pancreas: Unremarkable. Spleen: Unremarkable. Adrenals/Urinary Tract: Adrenals are unremarkable. Kidneys are unremarkable. The bladder is unremarkable with minimal contrast layering dependently. Stomach/Bowel: Moderate hiatal hernia. Stomach is otherwise unremarkable. Bowel is normal in caliber. Vascular/Lymphatic: Extensive atherosclerosis. 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 No enlarged lymph nodes. Reproductive: Status post hysterectomy. No adnexal masses. Other: No free fluid. Small ventral supraumbilical hernias containing fat and small vessels. Musculoskeletal: Lumbar spine degenerative changes. No acute osseous abnormality. IMPRESSION: No acute abnormality. Moderate hiatal hernia. Punctate layering gallstone or polyp. Extensive atherosclerosis. Additional vascular findings on the prior study are not well evaluated due to contrast phase. Electronically Signed   By: Macy Mis M.D.   On: 07/31/2021 14:34   DG Chest Port 1 View  Result Date: 07/31/2021 CLINICAL DATA:  Chest pain, left flank pain EXAM: PORTABLE CHEST 1 VIEW COMPARISON:  Chest radiograph 03/09/2021 FINDINGS: The cardiomediastinal silhouette is grossly stable. There is hazy opacity projecting over the left lower lung with obscuration of the left heart border, new since the prior study. Chain sutures projecting over the right upper lobe and surgical clips in the right hilar/infrahilar region are unchanged. There is no other focal consolidation. There is no pulmonary edema. There is  no pleural effusion or pneumothorax. There is no acute osseous abnormality. IMPRESSION: Hazy opacity in the left lung with obscuration of the left heart border suspicious for lingular pneumonia. Recommend follow-up radiographs in 6-8 weeks to assess for resolution. Electronically Signed   By: Valetta Mole M.D.   On: 07/31/2021 11:18    Procedures Procedures   Medications Ordered in ED Medications  cefTRIAXone (ROCEPHIN) 1 g in sodium chloride 0.9 % 100 mL IVPB (has no administration in time range)  azithromycin (ZITHROMAX) 500  mg in sodium chloride 0.9 % 250 mL IVPB (has no administration in time range)  morphine 4 MG/ML injection 4 mg (4 mg Intravenous Given 07/31/21 1143)  ondansetron (ZOFRAN) injection 4 mg (4 mg Intravenous Given 07/31/21 1143)  ipratropium-albuterol (DUONEB) 0.5-2.5 (3) MG/3ML nebulizer solution 3 mL (3 mLs Nebulization Given 07/31/21 1145)  sodium chloride 0.9 % bolus 1,000 mL (1,000 mLs Intravenous Bolus 07/31/21 1320)  iohexol (OMNIPAQUE) 350 MG/ML injection 80 mL (80 mLs Intravenous Contrast Given 07/31/21 1340)    ED Course  I have reviewed the triage vital signs and the nursing notes.  Pertinent labs & imaging results that were available during my care of the patient were reviewed by me and considered in my medical decision making (see chart for details).    MDM Rules/Calculators/A&P                           75 yo F with a chief complaints of left-sided chest pain.  Pleuritic.  No history of PE.  Pain seems reproducible on exam with palpation of the left chest wall.  Going on for a couple days.  Question musculoskeletal though possible pulmonary embolism or pneumonia pneumothorax.  Patient felt like her pain was more in her abdomen though no significant abdominal discomfort on exam.  We will start with a laboratory evaluation treat pain and nausea DuoNeb's the patient has diminished breath sounds in all fields.  Reassess.   Plain film concerning for  pneumonia.  CT scan confirming.  No intra-abdominal pathology.  Patient feeling a bit better after pain medicine and breathing treatments.  Still feels too unwell to go home.  Port score 96.  Will discuss with medicine.  The patients results and plan were reviewed and discussed.   Any x-rays performed were independently reviewed by myself.   Differential diagnosis were considered with the presenting HPI.  Medications  cefTRIAXone (ROCEPHIN) 1 g in sodium chloride 0.9 % 100 mL IVPB (has no administration in time range)  azithromycin (ZITHROMAX) 500 mg in sodium chloride 0.9 % 250 mL IVPB (has no administration in time range)  morphine 4 MG/ML injection 4 mg (4 mg Intravenous Given 07/31/21 1143)  ondansetron (ZOFRAN) injection 4 mg (4 mg Intravenous Given 07/31/21 1143)  ipratropium-albuterol (DUONEB) 0.5-2.5 (3) MG/3ML nebulizer solution 3 mL (3 mLs Nebulization Given 07/31/21 1145)  sodium chloride 0.9 % bolus 1,000 mL (1,000 mLs Intravenous Bolus 07/31/21 1320)  iohexol (OMNIPAQUE) 350 MG/ML injection 80 mL (80 mLs Intravenous Contrast Given 07/31/21 1340)    Vitals:   07/31/21 1148 07/31/21 1300 07/31/21 1315 07/31/21 1330  BP: (!) 107/55 (!) 94/54 97/67 (!) 101/53  Pulse: 89 97 87 78  Resp: 16 15  16   Temp:      TempSrc:      SpO2: 91% 90% 94% 92%  Weight:      Height:        Final diagnoses:  Community acquired pneumonia of left lower lobe of lung    Admission/ observation were discussed with the admitting physician, patient and/or family and they are comfortable with the plan.   Final Clinical Impression(s) / ED Diagnoses Final diagnoses:  Community acquired pneumonia of left lower lobe of lung    Rx / DC Orders ED Discharge Orders     None        Deno Etienne, DO 07/31/21 1457

## 2021-07-31 NOTE — ED Notes (Signed)
Multiple attempts to obtain blood cultures.

## 2021-07-31 NOTE — H&P (Signed)
History and Physical    [name] KDT:[id number] DOB: 01-12-1946 DOA: 07/31/2021  PCP: Martinique, [name], MD Consultants:  cardiology: Dr. Radford Pax oncology: sees Wilber Bihari, [place],    Pulmonology: Dr. Chase Caller Patient coming from:  Home - lives with husband   Chief Complaint: left sided chest pain and shortness of breath   HPI: [name] is a 75 y.o. female with medical history significant of systolic CHF, hypertension, hypothyroidism, hyperlipidemia, PVD, GERD, PAD, CAD, OSA, chronic respiratory failure on 2L oxygen [place] prn, chronic mesenteric ischemia and breast cancer who presented with left thoracic pain under her left breast that started about 2 days ago. Pain described as sharp/stabbing and rated as a 6/10. No radiation of the pain. Worse with breathing. Morphine has seemed to really help, but nothing helped at home. She feels more short of breath and has had chills.  No fevers and no coughing. No known sick contacts. She has not needed more oxygen than her baseline, but only uses on as needed basis.    She has had headaches, no vision changes, no substernal chest pain or palpitations, stomach pain, N/V/D, swelling in her legs. She does have dysuria that is chronic and is followed by urology.   ED Course: vitals: Temp 99.5, blood pressure 164/82, heart rate 103, respiratory rate 16, oxygen 95% on room air Pertinent labs: WBC 26.3, hemoglobin 11.5, potassium 3.4, X-ray: Hazy opacity in the left lung suspicious for lingular pneumonia. CTA: No evidence of PE, lingular consolidation suspicious for pneumonia. CT abdomen pelvis: No acute abnormality she does have a moderate hiatal hernia.  Extensive atherosclerosis. Given 1 L normal saline bolus, Zofran and morphine, and started on Rocephin and Zithromax for CAP.  TRH was asked to admit.  Review of Systems: As per HPI; otherwise review of systems reviewed and negative.   Ambulatory Status:  Ambulates without assistance   Past  Medical History:  Diagnosis Date   Bilateral leg cramps    Bladder neoplasm    Cancer (HCC)    CHF (congestive heart failure) (HCC)    Chronic deep vein thrombosis (DVT) of right lower extremity ([place])    05/ 2018  chronic non-occlusive DVT RLE   COPD, frequent exacerbations ([place])    03-06-2018 per pt last exacerbation 12/ 2018   Coronary artery disease    cardiologist-  dr Aundra Dubin-- 03-11-2018 er cath , 80% stenosis in the ostial second diagonal   Dyspnea on minimal exertion    Emphysema/COPD ([place])    CAT score- 17   Family history of breast cancer    Family history of colon cancer    Family history of melanoma    Family history of ovarian cancer    Family history of pancreatic cancer    Fibromyalgia 1995   GERD (gastroesophageal reflux disease)    Hiatal hernia    History of breast cancer    History of diverticulitis of colon    2002  s/p  sigmoid colectomy   History of multiple pulmonary nodules    hx RUL nodules x2  s/p right VATS w/ wedge resection 09-11-2005 and 06-14-2011  both  necrotizing granulomatous inflammation w/ cystic area of necrosis & focal calcification   History of right breast cancer oncologist-  dr Jana Hakim-- no recurrence   dx 2010--  IDC, Stage IA , ER/PR+,  (pT1c pN0) 11-09-2008 right lumpectomy;  12-18-2008 right simple mastectomy for DCIS margins;  completed chemotherapy 2010 (no radiation) and completed antiestrogen therapy   Hx  of colonic polyps    Dr. 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   Nocturia    OA (osteoarthritis)    thumbs   On supplemental oxygen therapy    03-06-2018 per pt uses only at night,  checks O2 sats at home,  stated am sat 89% after moving around average 93-94% with RA   Osteoporosis    Peripheral arterial occlusive disease ([place]) vascular-- dr chen/ dr Donzetta Matters   proximal right SFA severe focal stenosis with collaterals  from the PFA/  04/ 2012 occluded celiac and SMA arteries with distal reconstitution w/ patent IMA   Peripheral vascular disease ([place])    chronic DVT RLE,  mesenteric vascular disease   Right lower lobe pulmonary nodule    Chest CT 08-29-2017   Stage 3 severe COPD by GOLD classification ([place]) hx frequent exacerbations--   pulmologist-  dr Maryann Alar--  per lov note , dated 12-20-2017, oxyogen 2L is prescribed for use with exertion (but pt only uses mostly at night), O2 sats on RA run the 80s , this day sat 86% RA and with 2L O2 sat 92%   Varicose veins of leg with swelling    varicose vein surgery - Dr. Aleda Grana   Wears glasses    Wears hearing aid in both ears     Past Surgical History:  Procedure Laterality Date   ABDOMINAL AORTOGRAM N/A 07/11/2020   Procedure: ABDOMINAL AORTOGRAM;  Surgeon: Waynetta Sandy, MD;  Location: [place] [facility];  Service: Cardiovascular;  Laterality: N/A;   AORTIC ARCH ANGIOGRAPHY N/A 07/11/2020   Procedure: AORTIC ARCH ANGIOGRAPHY;  Surgeon: Waynetta Sandy, MD;  Location: [place] [facility];  Service: Cardiovascular;  Laterality: N/A;   BREAST EXCISIONAL BIOPSY Left 10/15/2008   BREAST LUMPECTOMY W/ NEEDLE LOCALIZATION Right 11-09-2008    dr cornett   [place]  03-12-2011   dr Aundra Dubin   70-80% ostial stenosis in small second diagonal (appears to small for intervention),  dLAD 40-50%,  minimal luminal irregulartieis involoving LCFx and RCA,  LVEF 55%   CATARACT EXTRACTION W/ INTRAOCULAR LENS  IMPLANT, BILATERAL  2011   CYSTOSCOPY N/A 03/11/2018   Procedure: CYSTOSCOPY WITH INSTILLATION OF POST OPERATIVE EPIRUBICIN;  Surgeon: Festus Aloe, MD;  Location: [facility];  Service: Urology;  Laterality: N/A;   LEFT HEART CATH AND CORONARY ANGIOGRAPHY N/A 05/25/2020   Procedure: LEFT HEART CATH AND CORONARY ANGIOGRAPHY;  Surgeon: Troy Sine, MD;  Location: [place] [facility];  Service: Cardiovascular;  Laterality: N/A;    MASTECTOMY Right    PARTIAL COLECTOMY  2002   sigmoid and appectomy (diverticulitis)   PARTIAL MASTECTOMY WITH NEEDLE LOCALIZATION Left 02/23/2013   Procedure:  LEFT PARTIAL MASTECTOMY WITH NEEDLE LOCALIZATION;  Surgeon: Adin Hector, MD;  Location: [place];  Service: General;  Laterality: Left;   PERIPHERAL VASCULAR INTERVENTION  07/11/2020   Procedure: PERIPHERAL VASCULAR INTERVENTION;  Surgeon: Waynetta Sandy, MD;  Location: [place] [facility];  Service: Cardiovascular;;   RECONSTRUCTION BREAST W/ LATISSIMUS DORSI FLAP Right 05-30-2009   dr Harlow Mares  [facility]   REDUCTION MAMMAPLASTY Left    SIMPLE MASTECTOMY Right 12-28-2008    dr cornett  [facility]   TRANSTHORACIC ECHOCARDIOGRAM  07-09-2016  dr Aundra Dubin   ef 55-60%, grade 1 diastoic dysfunction/  mild TR   TRANSURETHRAL RESECTION  OF BLADDER TUMOR N/A 03/11/2018   Procedure: TRANSURETHRAL RESECTION OF BLADDER TUMOR (TURBT) 2-5cm;  Surgeon: Festus Aloe, MD;  Location: [facility];  Service: Urology;  Laterality: N/A;   UPPER EXTREMITY ANGIOGRAPHY Left 07/11/2020   Procedure: Upper Extremity Angiography;  Surgeon: Waynetta Sandy, MD;  Location: [place] [facility];  Service: Cardiovascular;  Laterality: Left;   VAGINAL HYSTERECTOMY  1973   VIDEO ASSISTED THORACOSCOPY (VATS)/WEDGE RESECTION Right 09-11-2005  &  06-14-2011   dr Fara Boros  [facility]   both RUL    Social History   Socioeconomic History   Marital status: Married    Spouse name: Not on file   Number of children: 3   Years of education: 12   Highest education level: Not on file  Occupational History   Occupation: Chiropractor: RETIRED    Comment: retired  Tobacco Use   Smoking status: Former    Packs/day: 0.75    Years: 50.00    Pack years: 37.50    Types: Cigarettes    Quit date: 05/18/2016    Years since quitting: 5.2   Smokeless tobacco: Never  Vaping Use   Vaping Use: Never used  Substance and Sexual Activity   Alcohol use:  Yes    Alcohol/week: 10.0 standard drinks    Types: 10 Cans of beer per week   Drug use: No   Sexual activity: Not on file  Other Topics Concern   Not on file  Social History Narrative   HSG, beauty school. Married '69 -76 yrs/widowed; married '79 - 3yrdivorced; married '87.  2 sons - '70, '74, 1 srtep-son; 1 grandchild. Work - bInsurance claims handler40 yrs, retired. SO - good health.  NO history of abuse.  ACP - full code and all heroic measures.    Social Determinants of Health   Financial Resource Strain: Low Risk    Difficulty of Paying Living Expenses: Not hard at all  Food Insecurity: No Food Insecurity   Worried About RCharity fundraiserin the Last Year: Never true   RGovanin the Last Year: Never true  Transportation Needs: No Transportation Needs   Lack of Transportation (Medical): No   Lack of Transportation (Non-Medical): No  Physical Activity: Inactive   Days of Exercise per Week: 0 days   Minutes of Exercise per Session: 0 min  Stress: No Stress Concern Present   Feeling of Stress : Not at all  Social Connections: Socially Isolated   Frequency of Communication with Friends and Family: Once a week   Frequency of Social Gatherings with Friends and Family: Never   Attends Religious Services: Never   APrintmaker No   Attends CMusic therapist Never   Marital Status: Married  IHuman resources officerViolence: Not At Risk   Fear of Current or Ex-Partner: No   Emotionally Abused: No   Physically Abused: No   Sexually Abused: No    Allergies  Allergen Reactions   Sinequan [Doxepin] Other (See Comments)    Kept her awake    Sulfa Antibiotics Swelling    Family History  Problem Relation Age of Onset   Colon cancer Mother        colon   COPD Mother        brown lung   Hypertension Mother    Diabetes Mother    Emphysema Mother    Coronary artery disease Father    Heart attack Father  Sudden death Father    Heart disease  Father    Cancer Sister        throat   Hypertension Sister    Coronary artery disease Brother    Heart attack Brother        early 62s   Throat cancer Sister    Ovarian cancer Sister        fallopian tube cancer in her 1s   Melanoma Sister    Hypertension Sister    Pancreatic cancer Sister    Melanoma Niece        dx in her 52s   Breast cancer Niece 31    Prior to Admission medications   Medication Sig Start Date End Date Taking? Authorizing Provider  albuterol (VENTOLIN HFA) 108 (90 Base) MCG/ACT inhaler TAKE 2 PUFFS BY MOUTH EVERY 6 HOURS AS NEEDED FOR WHEEZE OR SHORTNESS OF BREATH Patient taking differently: Inhale 1 puff into the lungs every 4 (four) hours as needed for wheezing or shortness of breath. 09/05/20  Yes Martyn Ehrich, NP  aspirin EC 81 MG tablet Take 1 tablet (81 mg total) by mouth daily. 08/21/20  Yes Cherene Altes, MD  atorvastatin (LIPITOR) 80 MG tablet Take 1 tablet (80 mg total) by mouth daily. 06/15/21  Yes Pemberton, Greer Ee, MD  budesonide (PULMICORT) 0.5 MG/2ML nebulizer solution Take 2 mLs (0.5 mg total) by nebulization 2 (two) times daily. 09/15/20  Yes Elie Confer, MD  cilostazol (PLETAL) 100 MG tablet TAKE 1 TABLET BY MOUTH TWICE A DAY Patient taking differently: Take 100 mg by mouth 2 (two) times daily. 11/28/20  Yes [name], NP  clopidogrel (PLAVIX) 75 MG tablet TAKE 1 TABLET BY MOUTH EVERY DAY Patient taking differently: Take 75 mg by mouth daily. 07/08/21  Yes Waynetta Sandy, MD  DUPIXENT 300 MG/2ML SOPN Inject 2 mLs into the skin every 14 (fourteen) days. 01/03/21  Yes [provider]  esomeprazole (NEXIUM) 40 MG capsule Take 1 capsule (40 mg total) by mouth daily. 05/19/21  Yes Martinique, [name], MD  fluticasone (FLONASE) 50 MCG/ACT nasal spray USE 1 SPRAY INTO EACH NOSTRIL TWICE A DAY Patient taking differently: Place 1 spray into both nostrils 2 (two) times daily. 06/16/21  Yes Martinique, [name], MD   Fluticasone-Umeclidin-Vilant (TRELEGY ELLIPTA) 200-62.5-25 MCG/INH AEPB Inhale 1 puff into the lungs daily. 07/21/21  Yes Young, Tarri Fuller D, MD  furosemide (LASIX) 40 MG tablet TAKE 1 TABLET BY MOUTH TWICE A DAY Patient taking differently: Take 40 mg by mouth 2 (two) times daily. 06/26/21  Yes Martin[place], [name], MD  guaiFENesin (MUCINEX) 600 MG 12 hr tablet Take 600 mg by mouth daily.   Yes [provider]  hydrOXYzine (ATARAX/VISTARIL) 25 MG tablet Take 25 mg by mouth daily. 03/31/21  Yes [provider]  ipratropium-albuterol (DUONEB) 0.5-2.5 (3) MG/3ML SOLN INHALE 3 MLS INTO THE LUNGS EVERY 6 (SIX) HOURS AS NEEDED (SOB, WHEEZING). 05/09/21  Yes Martyn Ehrich, NP  KLOR-CON M20 20 MEQ tablet TAKE 1 TABLET BY MOUTH THREE TIMES A DAY Patient taking differently: Take 20 mEq by mouth 3 (three) times daily. 02/20/21  Yes Martin[place], [name], MD  levothyroxine (SYNTHROID) 112 MCG tablet TAKE 1 TABLET BY MOUTH EVERY DAY IN THE MORNING BEFORE BREAKFAST Patient taking differently: Take 112 mcg by mouth daily before breakfast. 12/16/20  Yes Martinique, [name], MD  LORazepam (ATIVAN) 2 MG tablet Take 1 tablet (2 mg total) by mouth at bedtime. 05/19/21  Yes Martinique, [name], MD  montelukast (SINGULAIR) 10 MG tablet TAKE 1 TABLET BY MOUTH EVERYDAY AT BEDTIME Patient taking differently: Take 10 mg by mouth at bedtime. 04/21/21  Yes Martyn Ehrich, NP  PARoxetine (PAXIL) 20 MG tablet TAKE 1 TABLET BY MOUTH EVERYDAY AT BEDTIME Patient taking differently: Take 20 mg by mouth daily. 02/27/21  Yes Martinique, [name], MD  tamsulosin (FLOMAX) 0.4 MG CAPS capsule TAKE 1 CAPSULE BY MOUTH EVERY DAY Patient taking differently: Take 0.4 mg by mouth daily. 07/17/21  Yes Martinique, [name], MD  traZODone (DESYREL) 100 MG tablet TAKE 1 TABLET BY MOUTH EVERYDAY AT BEDTIME Patient taking differently: Take 100 mg by mouth at bedtime. 06/20/21  Yes Martinique, [name], MD    Physical Exam: Vitals:   07/31/21 1148 07/31/21 1300  07/31/21 1315 07/31/21 1330  BP: (!) 107/55 (!) 94/54 97/67 (!) 101/53  Pulse: 89 97 87 78  Resp: '16 15  16  ' Temp:      TempSrc:      SpO2: 91% 90% 94% 92%  Weight:      Height:         General:  Appears calm and comfortable and is in NAD Eyes:  PERRL, EOMI, normal lids, iris ENT:  hard of hearing, HA in place, normal lips & tongue, mmm; appropriate dentition Neck:  no LAD, masses or thyromegaly; no carotid bruits Cardiovascular:  RRR, no m/r/g. No LE edema.  Respiratory:   CTA bilaterally with no wheezes/rales/rhonchi, but does have decreased air flow in LLL.  Normal respiratory effort. Abdomen:  soft, NT, ND, NABS Back:   normal alignment, bilateral lower back/CVA tenderness, chronic  Skin:  chronic eczema of lower legs  Musculoskeletal:  grossly normal tone BUE/BLE, good ROM, no bony abnormality Lower extremity:  No LE edema.  Limited foot exam with no ulcerations.  2+ distal pulses. Psychiatric:  grossly normal mood and affect, speech fluent and appropriate, AOx3 Neurologic:  CN 2-12 grossly intact, moves all extremities in coordinated fashion, sensation intact   Radiological Exams on Admission: Independently reviewed - see discussion in A/P where applicable  CT Angio Chest PE W and/or Wo Contrast  Result Date: 07/31/2021 CLINICAL DATA:  PE suspected, high prob EXAM: CT ANGIOGRAPHY CHEST WITH CONTRAST TECHNIQUE: Multidetector CT imaging of the chest was performed using the standard protocol during bolus administration of intravenous contrast. Multiplanar CT image reconstructions and MIPs were obtained to evaluate the vascular anatomy. CONTRAST:  34m OMNIPAQUE IOHEXOL 350 MG/ML SOLN COMPARISON:  08/14/2020 FINDINGS: Cardiovascular: Satisfactory opacification of the pulmonary arteries to the segmental level. No evidence of pulmonary embolism. Normal heart size. No pericardial effusion. Coronary artery calcification. Thoracic aorta is normal in caliber with mixed plaque.  Mediastinum/Nodes: No enlarged lymph nodes.  Hiatal hernia. Lungs/Pleura: Emphysema. Bilateral apical scarring. Postoperative changes right upper lobe. Lingular consolidation. No pleural effusion. No pneumothorax. Upper Abdomen: Dictated separately. Musculoskeletal: Right mastectomy with prosthesis. Degenerative changes of the included spine. Review of the MIP images confirms the above findings. IMPRESSION: No evidence of acute pulmonary embolism. Lingular consolidation suspicious for pneumonia. Emphysema. Coronary and aortic atherosclerosis. Electronically Signed   By: PMacy MisM.D.   On: 07/31/2021 14:46   CT ABDOMEN PELVIS W CONTRAST  Result Date: 07/31/2021 CLINICAL DATA:  Left upper quadrant abdominal pain EXAM: CT ABDOMEN AND PELVIS WITH CONTRAST TECHNIQUE: Multidetector CT imaging of the abdomen and pelvis was performed using the standard protocol following bolus administration of intravenous contrast. CONTRAST:  853mOMNIPAQUE  IOHEXOL 350 MG/ML SOLN COMPARISON:  12/01/2019 FINDINGS: Lower chest: Dictated separately. Hepatobiliary: No focal liver abnormality. Probable punctate layering gallstone or tiny polyp. No biliary dilatation. Pancreas: Unremarkable. Spleen: Unremarkable. Adrenals/Urinary Tract: Adrenals are unremarkable. Kidneys are unremarkable. The bladder is unremarkable with minimal contrast layering dependently. Stomach/Bowel: Moderate hiatal hernia. Stomach is otherwise unremarkable. Bowel is normal in caliber. Vascular/Lymphatic: Extensive atherosclerosis. 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 No enlarged lymph nodes. Reproductive: Status post hysterectomy. No adnexal masses. Other: No free fluid. Small ventral supraumbilical hernias containing fat and small vessels. Musculoskeletal: Lumbar spine degenerative changes. No acute osseous abnormality. IMPRESSION: No acute abnormality. Moderate hiatal hernia. Punctate layering  gallstone or polyp. Extensive atherosclerosis. Additional vascular findings on the prior study are not well evaluated due to contrast phase. Electronically Signed   By: Macy Mis M.D.   On: 07/31/2021 14:34   DG Chest Port 1 View  Result Date: 07/31/2021 CLINICAL DATA:  Chest pain, left flank pain EXAM: PORTABLE CHEST 1 VIEW COMPARISON:  Chest radiograph 03/09/2021 FINDINGS: The cardiomediastinal silhouette is grossly stable. There is hazy opacity projecting over the left lower lung with obscuration of the left heart border, new since the prior study. Chain sutures projecting over the right upper lobe and surgical clips in the right hilar/infrahilar region are unchanged. There is no other focal consolidation. There is no pulmonary edema. There is no pleural effusion or pneumothorax. There is no acute osseous abnormality. IMPRESSION: Hazy opacity in the left lung with obscuration of the left heart border suspicious for lingular pneumonia. Recommend follow-up radiographs in 6-8 weeks to assess for resolution. Electronically Signed   By: Valetta Mole M.D.   On: 07/31/2021 11:18    EKG: Independently reviewed.  NSR with rate 97, PVC; nonspecific ST changes with no evidence of acute ischemia   Labs on Admission: I have personally reviewed the available labs and imaging studies at the time of the admission.  Pertinent labs:  WBC 26.3,  hemoglobin 11.5,  potassium 3.4,  Assessment/Plan Principal Problem:   Sepsis due to pneumonia [facility]) -Patient presenting with pleuritic pain, chills, oxygen down to 87% on RA, requiring her home 2L [place] prn and infiltrate in left lower lobe on chest x-ray -symptoms, clinical findings and CXR findings consistent with pneumonia and most likely CAP.  -sepsis criteria met with WBC to 26.3, tachycardia and source. -blood cultures pending (rocehpin given by Ed before cx obtained)  -urinary antigens pending  -Respiratory virus panel  -Will order lower respiratory tract  procalcitonin level to help guide treatment -CURB-65 score is 1-2 - will admit the patient to Med Surg -continue Azithromycin 500 mg IV daily and Rocephin due to no risk factors for MDR cause  -duoneb scheduled x 3 then prn  -saba prn  -mucinex   Active Problems:   Hypokalemia -Check magnesium and replete potassium -Repeat labs in a.m. -has not had her daily potassium pills today, reordered home meds     Essential hypertension -on no medication, bp soft, continue to monitor     COPD, frequent exacerbations (HCC) GOLD stage III with chornic respiratory failure  -no sign of exacerbation continue home trelegyy, pulmicort and SABA/duoneb prn     CAD/HLD/PAD/chronic mesenteric ischemia -stable, continue medical therapy -recent left subclavian stenting by dr. Donzetta Matters -treating mesenteric disease medically  -ASA, lipitor, pletal, plavix -heart cath: 05/26/20: no significant coronary obstructive disease.     Chronic heart failure with preserved ejection  fraction (HFpEF) ([place]) -Appears euvolemic on exam -Monitor intake and output, low-salt diet -Continue medical management: lasix. Unable to tolerate other therapy du to dizziness. Appears she is no longer on low dose metoprolol.  -last echo: 05/2020: EF of 45-50%, mildly decreased LVF. LV global hypokinesis. Indeterminate diastolic filling.     OSA (obstructive sleep apnea) Is supposed to be on cpap at night, doesn't use like she should  -have ordered this qhs    Hypothyroidism -continue synthroid daily  -recent TSH 10/22 wnl     GERD -continue PPI     Prediabetes -recent a1c 07/2021: 5.8 -continue diet and life style modifications    Tobacco abuse  Down to 1/2 PPD Declines nicotine patch Smoking cessation counseling   Insomnia Continue trazodone/lorazepam nightly. Has been on lorazepam many years.   Anxiety/depression Continue paxil    Body mass index is 20.98 kg/m.   Level of care: Med-Surg DVT prophylaxis:   Lovenox  Code Status: DNR confirmed with patient Family Communication: None present Disposition Plan:  The patient is from: home  Anticipated d/c is to: home  Requires inpatient hospitalization and is at significant risk of worsening, requires constant monitoring, IV medication and frequent assessment.  Patient is currently: stable  Consults called: none  Admission status:  observation   Dragon dictation used in completing this note.    Orma Flaming MD [facility]   How to contact the [facility] Attending or Consulting provider [place] or covering provider during after hours [place], for this patient?  Check the care team in [facility] and look for a) attending/consulting [facility] provider listed and b) the [facility] team listed Log into [email or web address] and use [place]'s universal password to access. If you do not have the password, please contact the hospital operator. Locate the [facility] provider you are looking for under [facility] and page to a number that you can be directly reached. If you still have difficulty reaching the provider, please page the [facility] (Director on Call) for the Hospitalists listed on amion for assistance.   07/31/2021, 3:44 PM

## 2021-07-31 NOTE — ED Notes (Signed)
Unable to obtain 2nd set of blood cultures. 

## 2021-08-01 DIAGNOSIS — J189 Pneumonia, unspecified organism: Secondary | ICD-10-CM | POA: Diagnosis present

## 2021-08-01 DIAGNOSIS — I82501 Chronic embolism and thrombosis of unspecified deep veins of right lower extremity: Secondary | ICD-10-CM | POA: Diagnosis present

## 2021-08-01 DIAGNOSIS — D649 Anemia, unspecified: Secondary | ICD-10-CM | POA: Diagnosis present

## 2021-08-01 DIAGNOSIS — I959 Hypotension, unspecified: Secondary | ICD-10-CM

## 2021-08-01 DIAGNOSIS — F419 Anxiety disorder, unspecified: Secondary | ICD-10-CM | POA: Diagnosis present

## 2021-08-01 DIAGNOSIS — Z66 Do not resuscitate: Secondary | ICD-10-CM | POA: Diagnosis present

## 2021-08-01 DIAGNOSIS — J9621 Acute and chronic respiratory failure with hypoxia: Secondary | ICD-10-CM | POA: Diagnosis present

## 2021-08-01 DIAGNOSIS — M797 Fibromyalgia: Secondary | ICD-10-CM | POA: Diagnosis present

## 2021-08-01 DIAGNOSIS — J9611 Chronic respiratory failure with hypoxia: Secondary | ICD-10-CM

## 2021-08-01 DIAGNOSIS — E785 Hyperlipidemia, unspecified: Secondary | ICD-10-CM | POA: Diagnosis present

## 2021-08-01 DIAGNOSIS — F32A Depression, unspecified: Secondary | ICD-10-CM | POA: Diagnosis present

## 2021-08-01 DIAGNOSIS — I2510 Atherosclerotic heart disease of native coronary artery without angina pectoris: Secondary | ICD-10-CM | POA: Diagnosis present

## 2021-08-01 DIAGNOSIS — I5043 Acute on chronic combined systolic (congestive) and diastolic (congestive) heart failure: Secondary | ICD-10-CM | POA: Diagnosis present

## 2021-08-01 DIAGNOSIS — K219 Gastro-esophageal reflux disease without esophagitis: Secondary | ICD-10-CM | POA: Diagnosis present

## 2021-08-01 DIAGNOSIS — R0609 Other forms of dyspnea: Secondary | ICD-10-CM | POA: Diagnosis not present

## 2021-08-01 DIAGNOSIS — J439 Emphysema, unspecified: Secondary | ICD-10-CM | POA: Diagnosis present

## 2021-08-01 DIAGNOSIS — M810 Age-related osteoporosis without current pathological fracture: Secondary | ICD-10-CM | POA: Diagnosis present

## 2021-08-01 DIAGNOSIS — K551 Chronic vascular disorders of intestine: Secondary | ICD-10-CM | POA: Diagnosis present

## 2021-08-01 DIAGNOSIS — G4733 Obstructive sleep apnea (adult) (pediatric): Secondary | ICD-10-CM | POA: Diagnosis present

## 2021-08-01 DIAGNOSIS — G4700 Insomnia, unspecified: Secondary | ICD-10-CM | POA: Diagnosis present

## 2021-08-01 DIAGNOSIS — R300 Dysuria: Secondary | ICD-10-CM | POA: Diagnosis present

## 2021-08-01 DIAGNOSIS — E039 Hypothyroidism, unspecified: Secondary | ICD-10-CM | POA: Diagnosis present

## 2021-08-01 DIAGNOSIS — I110 Hypertensive heart disease with heart failure: Secondary | ICD-10-CM | POA: Diagnosis present

## 2021-08-01 DIAGNOSIS — Z20822 Contact with and (suspected) exposure to covid-19: Secondary | ICD-10-CM | POA: Diagnosis present

## 2021-08-01 DIAGNOSIS — I739 Peripheral vascular disease, unspecified: Secondary | ICD-10-CM | POA: Diagnosis present

## 2021-08-01 DIAGNOSIS — A419 Sepsis, unspecified organism: Secondary | ICD-10-CM | POA: Diagnosis present

## 2021-08-01 LAB — RESPIRATORY PANEL BY PCR

## 2021-08-01 LAB — BASIC METABOLIC PANEL
Anion gap: 5 (ref 5–15)
BUN: 13 mg/dL (ref 8–23)
CO2: 26 mmol/L (ref 22–32)
Calcium: 8.5 mg/dL — ABNORMAL LOW (ref 8.9–10.3)
Chloride: 107 mmol/L (ref 98–111)
Creatinine, Ser: 0.89 mg/dL (ref 0.44–1.00)
GFR, Estimated: >60 mL/min (ref >60)
Glucose, Bld: 89 mg/dL (ref 70–99)
Potassium: 3.7 mmol/L (ref 3.5–5.1)
Sodium: 138 mmol/L (ref 135–145)

## 2021-08-01 LAB — CBC
HCT: 29.8 % — ABNORMAL LOW (ref 36.0–46.0)
Hemoglobin: 9.7 g/dL — ABNORMAL LOW (ref 12.0–15.0)
MCH: 26.9 pg (ref 26.0–34.0)
MCHC: 32.6 g/dL (ref 30.0–36.0)
MCV: 82.5 fL (ref 80.0–100.0)
Platelets: 338 10*3/uL (ref 150–400)
RBC: 3.61 MIL/uL — ABNORMAL LOW (ref 3.87–5.11)
RDW: 16.2 % — ABNORMAL HIGH (ref 11.5–15.5)
WBC: 14.5 10*3/uL — ABNORMAL HIGH (ref 4.0–10.5)
nRBC: 0 % (ref 0.0–0.2)

## 2021-08-01 LAB — PROCALCITONIN: Procalcitonin: 1.78 ng/mL

## 2021-08-01 LAB — LACTIC ACID, PLASMA: Lactic Acid, Venous: 0.8 mmol/L (ref 0.5–1.9)

## 2021-08-01 LAB — STREP PNEUMONIAE URINARY ANTIGEN: Strep Pneumo Urinary Antigen: NEGATIVE

## 2021-08-01 MED ORDER — HYDROCORTISONE SOD SUC (PF) 100 MG IJ SOLR
100.0000 mg | Freq: Three times a day (TID) | INTRAMUSCULAR | Status: DC
  Administered 2021-08-01 – 2021-08-05 (×13): 100 mg via INTRAVENOUS
  Filled 2021-08-01 (×13): qty 2

## 2021-08-01 MED ORDER — LORAZEPAM 1 MG PO TABS
1.0000 mg | ORAL_TABLET | Freq: Every day | ORAL | Status: DC
  Administered 2021-08-01 – 2021-08-07 (×7): 1 mg via ORAL
  Filled 2021-08-01 (×7): qty 1

## 2021-08-01 MED ORDER — SODIUM CHLORIDE 0.9 % IV BOLUS
500.0000 mL | Freq: Once | INTRAVENOUS | Status: AC
  Administered 2021-08-01: 500 mL via INTRAVENOUS

## 2021-08-01 NOTE — Plan of Care (Signed)

## 2021-08-01 NOTE — Progress Notes (Signed)
[facility] PROGRESS NOTE   [name] YQI:[id number] DOB: 1946-01-09 DOA: 07/31/2021  PCP: [place], [name], MD  Brief History/Interval Summary: 75 y.o. female with medical history significant of systolic CHF, hypertension, hypothyroidism, hyperlipidemia, PVD, GERD, PAD, CAD, OSA, chronic respiratory failure on 2L oxygen  prn, chronic mesenteric ischemia and breast cancer who presented with left thoracic pain under her left breast that started about 2 days prior to admission.  Pain was described as a sharp stabbing pain.  In the emergency department she underwent work-up including chest x-ray, CT angiogram chest along with CT abdomen and pelvis.  No PE was noted.  Pneumonia was noted.  Patient was hospitalized for further management.  Reason for Visit: Community-acquired pneumonia  Consultants: None  Procedures: None   Subjective/Interval History: Patient feels fatigued.  Occasional dizziness/lightheadedness is present.  Has chest pain in the left side especially when she takes a deep breath or coughs.  No nausea vomiting.  Lives with her husband.    Assessment/Plan:  Community-acquired pneumonia/sepsis present on admission Patient was noted to have leukocytosis, tachycardia, tachypnea.  Noted to have elevated lactic acid level at 3.0.  Noted to be 0.8 this morning Follow-up culture data. Patient started on ceftriaxone and azithromycin. Be on 2 L of oxygen by nasal cannula.  We will need to check room air saturations when she is a bit more stable and improved. WBC noted to be better at 14.5 today from 26.3 yesterday.  Procalcitonin was elevated at 2.17, improved to 1.78. Influenza and COVID-19 PCR's were negative.  We will check respiratory viral panel as well.  Hypotension/possible adrenal insufficiency Patient with known history of essential hypertension.  Blood pressure noted to be soft.  Her Lasix will be discontinued for now.  Small fluid bolus will be ordered  since she is experiencing some symptoms.  May need to consider midodrine.  Lactic acid level reassuringly is normal. Patient was hospitalized back in May and was discharged on steroids.  There could be some adrenal insufficiency.  Will place her on hydrocortisone.  History of COPD, Gold stage III/chronic respiratory failure with hypoxia/tobacco abuse Uses 2 L of oxygen at home but mainly at nighttime.  Occasional wheezing is present.  Continue just with the nebulizer treatments and her inhaler therapy. See above regarding steroids.  Chronic systolic and diastolic diastolic CHF Last echocardiogram from 2021 showed mildly depressed EF of 45 to 50%.  Currently she appears to be hypovolemic.  We will hold her Lasix for now since she is hypotensive.  Not on any other heart failure medications.  Defer to outpatient providers.  History of coronary artery disease/peripheral artery disease/chronic mesenteric ischemia Underwent heart cath in 2021 which did not show any significant obstructive CAD. She had history of left subclavian stenting in the past.  Mesenteric disease is being treated medically.  She is on aspirin and Plavix, Pletal as well as statin treatment.  Normocytic anemia Drop in hemoglobin appears to be dilutional.  No evidence of overt blood loss noted.  Baseline hemoglobin seems to be around 10-12.  Hypokalemia Repleted.  Obstructive sleep apnea Is supposed to be on CPAP at night but does not use it like she should.  Hypothyroidism Continue levothyroxine.  History of GERD Continue PPI  History of anxiety/depression/insomnia Continue home medications.   DVT Prophylaxis: Lovenox Code Status: DNR Family Communication: No family at bedside.  She lives with her husband. Disposition Plan: Will need PT and OT evaluation.  Will start tomorrow.  May  need either home health or rehab or SNF rehab depending on her progress.  Status is: Observation  The patient will require care  spanning > 2 midnights and should be moved to inpatient because: Hypotension, continued shortness of breath, need for IV antibiotics    Medications: Scheduled:  aspirin EC  81 mg Oral Daily   atorvastatin  80 mg Oral Daily   budesonide  0.5 mg Nebulization BID   cilostazol  100 mg Oral BID   clopidogrel  75 mg Oral Daily   enoxaparin (LOVENOX) injection  40 mg Subcutaneous Q24H   fluticasone  1 spray Each Nare BID   fluticasone furoate-vilanterol  1 puff Inhalation Daily   And   umeclidinium bromide  1 puff Inhalation Daily   guaiFENesin  600 mg Oral Daily   hydrOXYzine  25 mg Oral QHS   levothyroxine  112 mcg Oral QAC breakfast   LORazepam  1 mg Oral QHS   montelukast  10 mg Oral QHS   pantoprazole  80 mg Oral Q1200   PARoxetine  20 mg Oral QHS   potassium chloride SA  20 mEq Oral TID   sodium chloride flush  3 mL Intravenous Q12H   tamsulosin  0.4 mg Oral QHS   traZODone  100 mg Oral QHS   Continuous:  sodium chloride     azithromycin     cefTRIAXone (ROCEPHIN)  IV     sodium chloride     DGL:OVFIEP chloride, acetaminophen **OR** acetaminophen, albuterol, HYDROcodone-acetaminophen, ipratropium-albuterol, senna-docusate, sodium chloride flush  Antibiotics: Anti-infectives (From admission, onward)    Start     Dose/Rate Route Frequency Ordered Stop   08/01/21 1800  azithromycin (ZITHROMAX) 500 mg in sodium chloride 0.9 % 250 mL IVPB        500 mg 250 mL/hr over 60 Minutes Intravenous Every 24 hours 07/31/21 1709 08/06/21 1759   08/01/21 1500  cefTRIAXone (ROCEPHIN) 1 g in sodium chloride 0.9 % 100 mL IVPB        1 g 200 mL/hr over 30 Minutes Intravenous Every 24 hours 07/31/21 1708     07/31/21 1500  cefTRIAXone (ROCEPHIN) 1 g in sodium chloride 0.9 % 100 mL IVPB        1 g 200 mL/hr over 30 Minutes Intravenous  Once 07/31/21 1454 07/31/21 1552   07/31/21 1500  azithromycin (ZITHROMAX) 500 mg in sodium chloride 0.9 % 250 mL IVPB        500 mg 250 mL/hr over 60  Minutes Intravenous  Once 07/31/21 1454 07/31/21 1850       Objective:  Vital Signs  Vitals:   08/01/21 0730 08/01/21 0743 08/01/21 0817 08/01/21 0840  BP:   (!) 90/51 (!) 90/54  Pulse:   80 82  Resp:    20  Temp:   97.7 F (36.5 C) 98 F (36.7 C)  TempSrc:   Oral Oral  SpO2: 92% 94% 93% 93%  Weight:      Height:        Intake/Output Summary (Last 24 hours) at 08/01/2021 1044 Last data filed at 08/01/2021 0943 Gross per 24 hour  Intake 458 ml  Output --  Net 458 ml   Filed Weights   07/31/21 1014 07/31/21 1946 08/01/21 0411  Weight: 59 kg 61.9 kg 63.9 kg    General appearance: Awake alert.  In no distress Resp: Tachypneic at rest.  No use of accessory muscles.  Coarse breath sounds bilaterally with crackles on the left and rhonchi  on the left more than right. Cardio: S1-S2 is normal regular.  No S3-S4.  No rubs murmurs or bruit GI: Abdomen is soft.  Nontender nondistended.  Bowel sounds are present normal.  No masses organomegaly Extremities: No edema.  Full range of motion of lower extremities. Neurologic: Alert and oriented x3.  No focal neurological deficits.    Lab Results:  Data Reviewed: I have personally reviewed following labs and imaging studies  CBC: Recent Labs  Lab 07/31/21 1150 08/01/21 0503  WBC 26.3* 14.5*  NEUTROABS 23.5*  --   HGB 11.5* 9.7*  HCT 34.6* 29.8*  MCV 80.8 82.5  PLT 435* 627    Basic Metabolic Panel: Recent Labs  Lab 07/31/21 1150 07/31/21 1729 08/01/21 0503  NA 134*  --  138  K 3.4*  --  3.7  CL 101  --  107  CO2 26  --  26  GLUCOSE 140*  --  89  BUN 16  --  13  CREATININE 0.89  --  0.89  CALCIUM 9.2  --  8.5*  MG  --  1.7  --     GFR: Estimated Creatinine Clearance: 50.3 mL/min (by C-G formula based on SCr of 0.89 mg/dL).  Liver Function Tests: Recent Labs  Lab 07/31/21 1150  AST 31  ALT 24  ALKPHOS 76  BILITOT 0.7  PROT 7.6  ALBUMIN 3.7    Recent Labs  Lab 07/31/21 1150  LIPASE 26      Recent Results (from the past 240 hour(s))  Resp Panel by RT-PCR (Flu A&B, Covid) Nasopharyngeal Swab     Status: None   Collection Time: 07/31/21  3:27 PM   Specimen: Nasopharyngeal Swab; Nasopharyngeal(NP) swabs in vial transport medium  Result Value Ref Range Status   SARS Coronavirus 2 by RT PCR NEGATIVE NEGATIVE Final    Comment: (NOTE) SARS-CoV-2 target nucleic acids are NOT DETECTED.  The SARS-CoV-2 RNA is generally detectable in upper respiratory specimens during the acute phase of infection. The lowest concentration of SARS-CoV-2 viral copies this assay can detect is 138 copies/mL. A negative result does not preclude SARS-Cov-2 infection and should not be used as the sole basis for treatment or other patient management decisions. A negative result may occur with  improper specimen collection/handling, submission of specimen other than nasopharyngeal swab, presence of viral mutation(s) within the areas targeted by this assay, and inadequate number of viral copies(<138 copies/mL). A negative result must be combined with clinical observations, patient history, and epidemiological information. The expected result is Negative.  Fact Sheet for Patients:  EntrepreneurPulse.com.au  Fact Sheet for Healthcare Providers:  IncredibleEmployment.be  This test is no t yet approved or cleared by the [place] FDA and  has been authorized for detection and/or diagnosis of SARS-CoV-2 by FDA under an Emergency Use Authorization (EUA). This EUA will remain  in effect (meaning this test can be used) for the duration of the COVID-19 declaration under Section 564(b)(1) of the Act, 21 U.S.C.section 360bbb-3(b)(1), unless the authorization is terminated  or revoked sooner.       Influenza A by PCR NEGATIVE NEGATIVE Final   Influenza B by PCR NEGATIVE NEGATIVE Final    Comment: (NOTE) The Xpert Xpress SARS-CoV-2/FLU/RSV plus assay is intended as  an aid in the diagnosis of influenza from Nasopharyngeal swab specimens and should not be used as a sole basis for treatment. Nasal washings and aspirates are unacceptable for Xpert Xpress SARS-CoV-2/FLU/RSV testing.  Fact Sheet for Patients: EntrepreneurPulse.com.au  Fact  Sheet for Healthcare Providers: IncredibleEmployment.be  This test is not yet approved or cleared by the [place] and has been authorized for detection and/or diagnosis of SARS-CoV-2 by FDA under an Emergency Use Authorization (EUA). This EUA will remain in effect (meaning this test can be used) for the duration of the COVID-19 declaration under Section 564(b)(1) of the Act, 21 U.S.C. section 360bbb-3(b)(1), unless the authorization is terminated or revoked.  Performed at [facility], [place] [address], [place], [place] [address]   Blood culture (routine x 2)     Status: None (Preliminary result)   Collection Time: 07/31/21  5:27 PM   Specimen: BLOOD LEFT FOREARM  Result Value Ref Range Status   Specimen Description   Final    BLOOD LEFT FOREARM Performed at [facility] [address] [place], [place] [address]    Special Requests   Final    BOTTLES DRAWN AEROBIC AND ANAEROBIC Blood Culture results may not be optimal due to an inadequate volume of blood received in culture bottles Performed at [facility] [address], [place], [place] [address]    Culture   Final    NO GROWTH < 12 HOURS Performed at [facility] [address] [place], [place] [address]    Report Status PENDING  Incomplete  Blood culture (routine x 2)     Status: None (Preliminary result)   Collection Time: 07/31/21  7:52 PM   Specimen: BLOOD LEFT WRIST  Result Value Ref Range Status   Specimen Description   Final    BLOOD LEFT WRIST Performed at [facility] [address] [place], [place] [address]    Special  Requests   Final    Blood Culture results may not be optimal due to an inadequate volume of blood received in culture bottles BOTTLES DRAWN AEROBIC ONLY Performed at [facility], [place] [address] [place], [place] [address]    Culture   Final    NO GROWTH < 12 HOURS Performed at [facility] [address] [place], [place] [address]    Report Status PENDING  Incomplete      Radiology Studies: CT Angio Chest PE W and/or Wo Contrast  Result Date: 07/31/2021 CLINICAL DATA:  PE suspected, high prob EXAM: CT ANGIOGRAPHY CHEST WITH CONTRAST TECHNIQUE: Multidetector CT imaging of the chest was performed using the standard protocol during bolus administration of intravenous contrast. Multiplanar CT image reconstructions and MIPs were obtained to evaluate the vascular anatomy. CONTRAST:  70mL OMNIPAQUE IOHEXOL 350 MG/ML SOLN COMPARISON:  08/14/2020 FINDINGS: Cardiovascular: Satisfactory opacification of the pulmonary arteries to the segmental level. No evidence of pulmonary embolism. Normal heart size. No pericardial effusion. Coronary artery calcification. Thoracic aorta is normal in caliber with mixed plaque. Mediastinum/Nodes: No enlarged lymph nodes.  Hiatal hernia. Lungs/Pleura: Emphysema. Bilateral apical scarring. Postoperative changes right upper lobe. Lingular consolidation. No pleural effusion. No pneumothorax. Upper Abdomen: Dictated separately. Musculoskeletal: Right mastectomy with prosthesis. Degenerative changes of the included spine. Review of the MIP images confirms the above findings. IMPRESSION: No evidence of acute pulmonary embolism. Lingular consolidation suspicious for pneumonia. Emphysema. Coronary and aortic atherosclerosis. Electronically Signed   By: Macy Mis M.D.   On: 07/31/2021 14:46   CT ABDOMEN PELVIS W CONTRAST  Result Date: 07/31/2021 CLINICAL DATA:  Left upper quadrant abdominal pain EXAM: CT ABDOMEN AND PELVIS WITH CONTRAST TECHNIQUE:  Multidetector CT imaging of the abdomen and pelvis  was performed using the standard protocol following bolus administration of intravenous contrast. CONTRAST:  28mL OMNIPAQUE IOHEXOL 350 MG/ML SOLN COMPARISON:  12/01/2019 FINDINGS: Lower chest: Dictated separately. Hepatobiliary: No focal liver abnormality. Probable punctate layering gallstone or tiny polyp. No biliary dilatation. Pancreas: Unremarkable. Spleen: Unremarkable. Adrenals/Urinary Tract: Adrenals are unremarkable. Kidneys are unremarkable. The bladder is unremarkable with minimal contrast layering dependently. Stomach/Bowel: Moderate hiatal hernia. Stomach is otherwise unremarkable. Bowel is normal in caliber. Vascular/Lymphatic: Extensive atherosclerosis. 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 No enlarged lymph nodes. Reproductive: Status post hysterectomy. No adnexal masses. Other: No free fluid. Small ventral supraumbilical hernias containing fat and small vessels. Musculoskeletal: Lumbar spine degenerative changes. No acute osseous abnormality. IMPRESSION: No acute abnormality. Moderate hiatal hernia. Punctate layering gallstone or polyp. Extensive atherosclerosis. Additional vascular findings on the prior study are not well evaluated due to contrast phase. Electronically Signed   By: Macy Mis M.D.   On: 07/31/2021 14:34   DG Chest Port 1 View  Result Date: 07/31/2021 CLINICAL DATA:  Chest pain, left flank pain EXAM: PORTABLE CHEST 1 VIEW COMPARISON:  Chest radiograph 03/09/2021 FINDINGS: The cardiomediastinal silhouette is grossly stable. There is hazy opacity projecting over the left lower lung with obscuration of the left heart border, new since the prior study. Chain sutures projecting over the right upper lobe and surgical clips in the right hilar/infrahilar region are unchanged. There is no other focal consolidation. There is no pulmonary edema. There is no pleural effusion  or pneumothorax. There is no acute osseous abnormality. IMPRESSION: Hazy opacity in the left lung with obscuration of the left heart border suspicious for lingular pneumonia. Recommend follow-up radiographs in 6-8 weeks to assess for resolution. Electronically Signed   By: Valetta Mole M.D.   On: 07/31/2021 11:18       LOS: 0 days   [facility] Pager on [email or web address]  08/01/2021, 10:44 AM

## 2021-08-02 ENCOUNTER — Inpatient Hospital Stay (HOSPITAL_COMMUNITY): Payer: Medicare Other

## 2021-08-02 DIAGNOSIS — R0609 Other forms of dyspnea: Secondary | ICD-10-CM

## 2021-08-02 LAB — ECHOCARDIOGRAM COMPLETE
Area-P 1/2: 4.93 cm2
Height: 66 in
S' Lateral: 2.4 cm
Weight: 2257.51 oz

## 2021-08-02 LAB — PROCALCITONIN: Procalcitonin: 0.85 ng/mL

## 2021-08-02 LAB — FOLATE: Folate: 15.4 ng/mL (ref >5.9)

## 2021-08-02 LAB — RETICULOCYTES
Immature Retic Fract: 15.4 % (ref 2.3–15.9)
RBC.: 3.72 MIL/uL — ABNORMAL LOW (ref 3.87–5.11)
Retic Count, Absolute: 49.5 10*3/uL (ref 19.0–186.0)
Retic Ct Pct: 1.3 % (ref 0.4–3.1)

## 2021-08-02 LAB — CBC
HCT: 31.3 % — ABNORMAL LOW (ref 36.0–46.0)
Hemoglobin: 9.8 g/dL — ABNORMAL LOW (ref 12.0–15.0)
MCH: 26.4 pg (ref 26.0–34.0)
MCHC: 31.3 g/dL (ref 30.0–36.0)
MCV: 84.4 fL (ref 80.0–100.0)
Platelets: 382 10*3/uL (ref 150–400)
RBC: 3.71 MIL/uL — ABNORMAL LOW (ref 3.87–5.11)
RDW: 16.3 % — ABNORMAL HIGH (ref 11.5–15.5)
WBC: 8.3 10*3/uL (ref 4.0–10.5)
nRBC: 0 % (ref 0.0–0.2)

## 2021-08-02 LAB — VITAMIN B12: Vitamin B-12: 227 pg/mL (ref 180–914)

## 2021-08-02 LAB — IRON AND TIBC
Iron: 24 ug/dL — ABNORMAL LOW (ref 28–170)
Saturation Ratios: 8 % — ABNORMAL LOW (ref 10.4–31.8)
TIBC: 300 ug/dL (ref 250–450)
UIBC: 276 ug/dL

## 2021-08-02 LAB — BASIC METABOLIC PANEL
Anion gap: 6 (ref 5–15)
BUN: 13 mg/dL (ref 8–23)
CO2: 23 mmol/L (ref 22–32)
Calcium: 8.6 mg/dL — ABNORMAL LOW (ref 8.9–10.3)
Chloride: 106 mmol/L (ref 98–111)
Creatinine, Ser: 0.63 mg/dL (ref 0.44–1.00)
GFR, Estimated: >60 mL/min (ref >60)
Glucose, Bld: 163 mg/dL — ABNORMAL HIGH (ref 70–99)
Potassium: 4.4 mmol/L (ref 3.5–5.1)
Sodium: 135 mmol/L (ref 135–145)

## 2021-08-02 LAB — FERRITIN: Ferritin: 57 ng/mL (ref 11–307)

## 2021-08-02 MED ORDER — FERROUS SULFATE 325 (65 FE) MG PO TABS
325.0000 mg | ORAL_TABLET | Freq: Every day | ORAL | Status: DC
  Administered 2021-08-03 – 2021-08-08 (×6): 325 mg via ORAL
  Filled 2021-08-02 (×6): qty 1

## 2021-08-02 MED ORDER — VITAMIN B-12 1000 MCG PO TABS
1000.0000 ug | ORAL_TABLET | Freq: Every day | ORAL | Status: DC
  Administered 2021-08-03 – 2021-08-07 (×5): 1000 ug via ORAL
  Filled 2021-08-02 (×5): qty 1

## 2021-08-02 NOTE — Progress Notes (Signed)
PROGRESS NOTE    [name]  RKY:[id number] DOB: 06/08/1946 DOA: 07/31/2021 PCP: [place], [name], MD   Chief Complaint  Patient presents with   Flank Pain   Back Pain   Brief Narrative:  75 y.o. female with medical history significant of systolic CHF, hypertension, hypothyroidism, hyperlipidemia, PVD, GERD, PAD, CAD, OSA, chronic respiratory failure on 2L oxygen [place] prn, chronic mesenteric ischemia and breast cancer who presented with left thoracic pain under her left breast that started about 2 days prior to admission.  Pain was described as Camran Keady sharp stabbing pain.  In the emergency department she underwent work-up including chest x-ray, CT angiogram chest along with CT abdomen and pelvis.  No PE was noted.  Pneumonia was noted.  Patient was hospitalized for further management.  Assessment & Plan:   Principal Problem:   Sepsis due to pneumonia [facility]) Active Problems:   Hypothyroidism   Hyperlipidemia   Essential hypertension   COPD, frequent exacerbations (HCC)   GERD   PAD (peripheral artery disease) (HCC)   Tobacco abuse   CAD (coronary artery disease)   Chronic respiratory failure with hypoxia (HCC)   OSA (obstructive sleep apnea)   Hypokalemia   Prediabetes   Systolic CHF, chronic ([place])   CAP (community acquired pneumonia)  Community-acquired pneumonia  Sepsis Patient was noted to have leukocytosis, tachycardia, tachypnea.  Noted to have elevated lactic acid level at 3.0.  Follow-up culture data - pending blood cultures Follow sputum cx Urine strep negative, urine legionella pending RVP negative.  Negative influenza, covid. Continue ceftriaxone, azithromycin Continues on 2 L [place] WBC count and procal improving She feels worse today, repeat CXR with lingular consolidation   Hypotension/possible adrenal insufficiency Suspect 2/2 above BP still soft this AM Continue solucortef for now, will try to wean quickly if BP's improve - discharged on steroid taper in May,  doesn't seem like it was continued until this admission per discussion Continue to hold lasix for now Echo with hypotension Most recent BP improved, follow closely  History of COPD, Gold stage III/chronic respiratory failure with hypoxia/tobacco abuse Uses 2 L of oxygen at home but mainly at nighttime.  Occasional wheezing is present.  Continue just with the nebulizer treatments and her inhaler therapy. See above regarding steroids.  Chronic systolic and diastolic diastolic CHF Last echocardiogram from 2021 showed mildly depressed EF of 45 to 50%.  Repeat echo with EF 31-51%, grade 2 diastolic dysfunction Lasix on hold with soft BP  History of coronary artery disease/peripheral artery disease/chronic mesenteric ischemia Underwent heart cath in 2021 which did not show any significant obstructive CAD. She had history of left subclavian stenting in the past.  Mesenteric disease is being treated medically.  She is on aspirin and Plavix, Pletal as well as statin treatment.   Normocytic anemia Drop in hemoglobin appears to be dilutional.  No evidence of overt blood loss noted.  Baseline hemoglobin seems to be around 10-12. Low normal B12, follow MMA - supplement Iron def noted as well   Hypokalemia Repleted.  Obstructive sleep apnea Is supposed to be on CPAP at night but does not use it like she should.  Hypothyroidism Continue levothyroxine.  History of GERD Continue PPI   History of anxiety/depression/insomnia Continue home medications.  DVT prophylaxis: lovenox Code Status: DNR Family Communication: none at bedside Disposition:   Status is: Inpatient  Remains inpatient appropriate because: need for IV abx     Consultants:  none  Procedures:  Echo IMPRESSIONS  1. Left ventricular ejection fraction, by estimation, is 55 to 60%. The  left ventricle has normal function. The left ventricle has no regional  wall motion abnormalities. Left ventricular diastolic  parameters are  consistent with Grade II diastolic  dysfunction (pseudonormalization).   2. Right ventricular systolic function is normal. The right ventricular  size is normal. Tricuspid regurgitation signal is inadequate for assessing  PA pressure.   3. The mitral valve is normal in structure. No evidence of mitral valve  regurgitation. No evidence of mitral stenosis.   4. The aortic valve is normal in structure. Aortic valve regurgitation is  not visualized. No aortic stenosis is present.   5. The inferior vena cava is dilated in size with >50% respiratory  variability, suggesting right atrial pressure of 8 mmHg.   Comparison(s): Compared to prior echo in 05/21/2020, there has been  improvement in the Left ventricular ejection fraction was 45-50% now  55-60%.   Antimicrobials:  Anti-infectives (From admission, onward)    Start     Dose/Rate Route Frequency Ordered Stop   08/01/21 1800  azithromycin (ZITHROMAX) 500 mg in sodium chloride 0.9 % 250 mL IVPB        500 mg 250 mL/hr over 60 Minutes Intravenous Every 24 hours 07/31/21 1709 08/06/21 1759   08/01/21 1500  cefTRIAXone (ROCEPHIN) 1 g in sodium chloride 0.9 % 100 mL IVPB        1 g 200 mL/hr over 30 Minutes Intravenous Every 24 hours 07/31/21 1708     07/31/21 1500  cefTRIAXone (ROCEPHIN) 1 g in sodium chloride 0.9 % 100 mL IVPB        1 g 200 mL/hr over 30 Minutes Intravenous  Once 07/31/21 1454 07/31/21 1552   07/31/21 1500  azithromycin (ZITHROMAX) 500 mg in sodium chloride 0.9 % 250 mL IVPB        500 mg 250 mL/hr over 60 Minutes Intravenous  Once 07/31/21 1454 07/31/21 1850          Subjective: Asking about her medicines Feels like she's getting worse  Objective: Vitals:   08/02/21 0435 08/02/21 0753 08/02/21 0912 08/02/21 1339  BP: (!) 94/51   (!) 141/72  Pulse: 69   80  Resp: 14   20  Temp: (!) 97.4 F (36.3 C)   98.4 F (36.9 C)  TempSrc: Oral   Oral  SpO2: 98% 90% 94% 92%  Weight: 64 kg      Height:        Intake/Output Summary (Last 24 hours) at 08/02/2021 1854 Last data filed at 08/02/2021 1640 Gross per 24 hour  Intake 679.43 ml  Output --  Net 679.43 ml   Filed Weights   07/31/21 1946 08/01/21 0411 08/02/21 0435  Weight: 61.9 kg 63.9 kg 64 kg    Examination:  General: No acute distress. Cardiovascular: RRR Lungs: coarse breath sounds, coarse cough Abdomen: Soft, nontender, nondistended Neurological: Alert and oriented 3. Moves all extremities 4. Cranial nerves II through XII grossly intact. Skin: Warm and dry. No rashes or lesions. Extremities: No clubbing or cyanosis. No edema.  Data Reviewed: I have personally reviewed following labs and imaging studies  CBC: Recent Labs  Lab 07/31/21 1150 08/01/21 0503 08/02/21 0519  WBC 26.3* 14.5* 8.3  NEUTROABS 23.5*  --   --   HGB 11.5* 9.7* 9.8*  HCT 34.6* 29.8* 31.3*  MCV 80.8 82.5 84.4  PLT 435* 338 025    Basic Metabolic Panel: Recent Labs  Lab 07/31/21  1150 07/31/21 1729 08/01/21 0503 08/02/21 0519  NA 134*  --  138 135  K 3.4*  --  3.7 4.4  CL 101  --  107 106  CO2 26  --  26 23  GLUCOSE 140*  --  89 163*  BUN 16  --  13 13  CREATININE 0.89  --  0.89 0.63  CALCIUM 9.2  --  8.5* 8.6*  MG  --  1.7  --   --     GFR: Estimated Creatinine Clearance: 56 mL/min (by C-G formula based on SCr of 0.63 mg/dL).  Liver Function Tests: Recent Labs  Lab 07/31/21 1150  AST 31  ALT 24  ALKPHOS 76  BILITOT 0.7  PROT 7.6  ALBUMIN 3.7    CBG: No results for input(s): GLUCAP in the last 168 hours.   Recent Results (from the past 240 hour(s))  Respiratory (~20 pathogens) panel by PCR     Status: None   Collection Time: 07/31/21 11:07 AM   Specimen: Nasopharyngeal Swab; Respiratory  Result Value Ref Range Status   Adenovirus NOT DETECTED NOT DETECTED Final   Coronavirus 229E NOT DETECTED NOT DETECTED Final    Comment: (NOTE) The Coronavirus on the Respiratory Panel, DOES NOT test for the  novel  Coronavirus (2019 nCoV)    Coronavirus HKU1 NOT DETECTED NOT DETECTED Final   Coronavirus NL63 NOT DETECTED NOT DETECTED Final   Coronavirus OC43 NOT DETECTED NOT DETECTED Final   Metapneumovirus NOT DETECTED NOT DETECTED Final   Rhinovirus / Enterovirus NOT DETECTED NOT DETECTED Final   Influenza Abelina Ketron NOT DETECTED NOT DETECTED Final   Influenza B NOT DETECTED NOT DETECTED Final   Parainfluenza Virus 1 NOT DETECTED NOT DETECTED Final   Parainfluenza Virus 2 NOT DETECTED NOT DETECTED Final   Parainfluenza Virus 3 NOT DETECTED NOT DETECTED Final   Parainfluenza Virus 4 NOT DETECTED NOT DETECTED Final   Respiratory Syncytial Virus NOT DETECTED NOT DETECTED Final   Bordetella pertussis NOT DETECTED NOT DETECTED Final   Bordetella Parapertussis NOT DETECTED NOT DETECTED Final   Chlamydophila pneumoniae NOT DETECTED NOT DETECTED Final   Mycoplasma pneumoniae NOT DETECTED NOT DETECTED Final    Comment: Performed at [facility], [place]. [address] [place], [place] [address]  Resp Panel by RT-PCR (Flu Maegan Buller&B, Covid) Nasopharyngeal Swab     Status: None   Collection Time: 07/31/21  3:27 PM   Specimen: Nasopharyngeal Swab; Nasopharyngeal(NP) swabs in vial transport medium  Result Value Ref Range Status   SARS Coronavirus 2 by RT PCR NEGATIVE NEGATIVE Final    Comment: (NOTE) SARS-CoV-2 target nucleic acids are NOT DETECTED.  The SARS-CoV-2 RNA is generally detectable in upper respiratory specimens during the acute phase of infection. The lowest concentration of SARS-CoV-2 viral copies this assay can detect is 138 copies/mL. Hampton Wixom negative result does not preclude SARS-Cov-2 infection and should not be used as the sole basis for treatment or other patient management decisions. Mireya Meditz negative result may occur with  improper specimen collection/handling, submission of specimen other than nasopharyngeal swab, presence of viral mutation(s) within the areas targeted by this assay, and inadequate  number of viral copies(<138 copies/mL). Skyanne Welle negative result must be combined with clinical observations, patient history, and epidemiological information. The expected result is Negative.  Fact Sheet for Patients:  EntrepreneurPulse.com.au  Fact Sheet for Healthcare Providers:  IncredibleEmployment.be  This test is no t yet approved or cleared by the [place] and  has been authorized for  detection and/or diagnosis of SARS-CoV-2 by FDA under an Emergency Use Authorization (EUA). This EUA will remain  in effect (meaning this test can be used) for the duration of the COVID-19 declaration under Section 564(b)(1) of the Act, 21 U.S.C.section 360bbb-3(b)(1), unless the authorization is terminated  or revoked sooner.       Influenza Ieasha Boerema by PCR NEGATIVE NEGATIVE Final   Influenza B by PCR NEGATIVE NEGATIVE Final    Comment: (NOTE) The Xpert Xpress SARS-CoV-2/FLU/RSV plus assay is intended as an aid in the diagnosis of influenza from Nasopharyngeal swab specimens and should not be used as Bexleigh Theriault sole basis for treatment. Nasal washings and aspirates are unacceptable for Xpert Xpress SARS-CoV-2/FLU/RSV testing.  Fact Sheet for Patients: EntrepreneurPulse.com.au  Fact Sheet for Healthcare Providers: IncredibleEmployment.be  This test is not yet approved or cleared by the [place] FDA and has been authorized for detection and/or diagnosis of SARS-CoV-2 by FDA under an Emergency Use Authorization (EUA). This EUA will remain in effect (meaning this test can be used) for the duration of the COVID-19 declaration under Section 564(b)(1) of the Act, 21 U.S.C. section 360bbb-3(b)(1), unless the authorization is terminated or revoked.  Performed at [facility], [place] [address], [place], [place] [address]   Blood culture (routine x 2)     Status: None (Preliminary result)   Collection Time:  07/31/21  5:27 PM   Specimen: BLOOD LEFT FOREARM  Result Value Ref Range Status   Specimen Description   Final    BLOOD LEFT FOREARM Performed at [facility] [address], [place], [place] [address]    Special Requests   Final    BOTTLES DRAWN AEROBIC AND ANAEROBIC Blood Culture results may not be optimal due to an inadequate volume of blood received in culture bottles Performed at [facility] [address], [place], [place] [address]    Culture   Final    NO GROWTH 2 DAYS Performed at [facility] [address], [place], [place] [address]    Report Status PENDING  Incomplete  Blood culture (routine x 2)     Status: None (Preliminary result)   Collection Time: 07/31/21  7:52 PM   Specimen: BLOOD LEFT WRIST  Result Value Ref Range Status   Specimen Description   Final    BLOOD LEFT WRIST Performed at [facility] [address] [place], [place] [address]    Special Requests   Final    Blood Culture results may not be optimal due to an inadequate volume of blood received in culture bottles BOTTLES DRAWN AEROBIC ONLY Performed at [facility], [place] [address] [place], [place] [address]    Culture   Final    NO GROWTH 2 DAYS Performed at [facility] [address], [place], [place] [address]    Report Status PENDING  Incomplete         Radiology Studies: DG CHEST PORT 1 VIEW  Result Date: 08/02/2021 CLINICAL DATA:  CHF. EXAM: PORTABLE CHEST 1 VIEW COMPARISON:  Chest x-ray and chest CT 07/31/2021. FINDINGS: Cardiomediastinal silhouette is within normal limits. Postsurgical changes are again seen in the right upper lung. Surgical clips are noted in the right chest wall, unchanged. Lingular airspace disease appears unchanged from the prior examination. There is no pleural effusion or pneumothorax. No acute fractures are seen. IMPRESSION: 1. Unchanged lingular consolidation. Electronically  Signed   By: Tina Griffiths.D.  On: 08/02/2021 15:43   ECHOCARDIOGRAM COMPLETE  Result Date: 08/02/2021    ECHOCARDIOGRAM REPORT   Patient Name:   [name] Date of Exam: 08/02/2021 Medical Rec #:  [id number]     Height:       66.0 in Accession #:    [id number]    Weight:       141.1 lb Date of Birth:  [birth year]     BSA:          1.724 m Patient Age:    72 years      BP:           141/72 mmHg Patient Gender: F             HR:           80 bpm. Exam Location:  Inpatient Procedure: 2D Echo, Cardiac Doppler and Color Doppler Indications:    Dyspnea  History:        Patient has prior history of Echocardiogram examinations, most                 recent 05/21/2020. CHF, CAD, PVD, Signs/Symptoms:Dyspnea and                 Pneumonia; Risk Factors:Hypertension, Dyslipidemia and Sleep                 Apnea. Resp. failure.  Sonographer:    Dustin Flock RDCS Referring Phys: [phone] Annina Piotrowski CALDWELL POWELL JR  Sonographer Comments: Technically challenging study due to limited acoustic windows. Image acquisition challenging due to respiratory motion. Patient has pneumonia IMPRESSIONS  1. Left ventricular ejection fraction, by estimation, is 55 to 60%. The left ventricle has normal function. The left ventricle has no regional wall motion abnormalities. Left ventricular diastolic parameters are consistent with Grade II diastolic dysfunction (pseudonormalization).  2. Right ventricular systolic function is normal. The right ventricular size is normal. Tricuspid regurgitation signal is inadequate for assessing PA pressure.  3. The mitral valve is normal in structure. No evidence of mitral valve regurgitation. No evidence of mitral stenosis.  4. The aortic valve is normal in structure. Aortic valve regurgitation is not visualized. No aortic stenosis is present.  5. The inferior vena cava is dilated in size with >50% respiratory variability, suggesting right atrial pressure of 8 mmHg. Comparison(s): Compared to prior echo in  05/21/2020, there has been improvement in the Left ventricular ejection fraction was 45-50% now 55-60%. FINDINGS  Left Ventricle: Left ventricular ejection fraction, by estimation, is 55 to 60%. The left ventricle has normal function. The left ventricle has no regional wall motion abnormalities. The left ventricular internal cavity size was normal in size. There is  no left ventricular hypertrophy. Left ventricular diastolic parameters are consistent with Grade II diastolic dysfunction (pseudonormalization). Right Ventricle: The right ventricular size is normal. No increase in right ventricular wall thickness. Right ventricular systolic function is normal. Tricuspid regurgitation signal is inadequate for assessing PA pressure. Left Atrium: Left atrial size was normal in size. Right Atrium: Right atrial size was normal in size. Pericardium: There is no evidence of pericardial effusion. Mitral Valve: The mitral valve is normal in structure. Mild to moderate mitral annular calcification. No evidence of mitral valve regurgitation. No evidence of mitral valve stenosis. Tricuspid Valve: The tricuspid valve is normal in structure. Tricuspid valve regurgitation is not demonstrated. No evidence of tricuspid stenosis. Aortic Valve: The aortic valve is normal in structure. Aortic valve regurgitation is not visualized. No aortic stenosis is present.  Pulmonic Valve: The pulmonic valve was normal in structure. Pulmonic valve regurgitation is not visualized. No evidence of pulmonic stenosis. Aorta: The aortic root is normal in size and structure. Venous: The inferior vena cava is dilated in size with greater than 50% respiratory variability, suggesting right atrial pressure of 8 mmHg. IAS/Shunts: No atrial level shunt detected by color flow Doppler.  LEFT VENTRICLE PLAX 2D LVIDd:         4.40 cm   Diastology LVIDs:         2.40 cm   LV e' medial:    4.35 cm/s LV PW:         1.00 cm   LV E/e' medial:  21.7 LV IVS:        1.00 cm   LV  e' lateral:   7.18 cm/s LVOT diam:     2.10 cm   LV E/e' lateral: 13.1 LV SV:         65 LV SV Index:   38 LVOT Area:     3.46 cm  RIGHT VENTRICLE RV Basal diam:  3.00 cm RV S prime:     9.46 cm/s TAPSE (M-mode): 1.8 cm LEFT ATRIUM             Index        RIGHT ATRIUM           Index LA diam:        4.60 cm 2.67 cm/m   RA Area:     11.80 cm LA Vol (A2C):   52.3 ml 30.33 ml/m  RA Volume:   26.20 ml  15.20 ml/m LA Vol (A4C):   24.8 ml 14.38 ml/m LA Biplane Vol: 37.7 ml 21.87 ml/m  AORTIC VALVE LVOT Vmax:   87.90 cm/s LVOT Vmean:  55.100 cm/s LVOT VTI:    0.188 m  AORTA Ao Root diam: 2.90 cm MITRAL VALVE MV Area (PHT): 4.93 cm    SHUNTS MV Decel Time: 154 msec    Systemic VTI:  0.19 m MV E velocity: 94.30 cm/s  Systemic Diam: 2.10 cm MV Kyrianna Barletta velocity: 85.30 cm/s MV E/Hedy Garro ratio:  1.11 Kardie Tobb DO Electronically signed by Berniece Salines DO Signature Date/Time: 08/02/2021/6:11:22 PM    Final         Scheduled Meds:  aspirin EC  81 mg Oral Daily   atorvastatin  80 mg Oral Daily   budesonide  0.5 mg Nebulization BID   cilostazol  100 mg Oral BID   clopidogrel  75 mg Oral Daily   enoxaparin (LOVENOX) injection  40 mg Subcutaneous Q24H   fluticasone  1 spray Each Nare BID   fluticasone furoate-vilanterol  1 puff Inhalation Daily   And   umeclidinium bromide  1 puff Inhalation Daily   guaiFENesin  600 mg Oral Daily   hydrocortisone sod succinate (SOLU-CORTEF) inj  100 mg Intravenous Q8H   hydrOXYzine  25 mg Oral QHS   levothyroxine  112 mcg Oral QAC breakfast   LORazepam  1 mg Oral QHS   montelukast  10 mg Oral QHS   pantoprazole  80 mg Oral Q1200   PARoxetine  20 mg Oral QHS   sodium chloride flush  3 mL Intravenous Q12H   tamsulosin  0.4 mg Oral QHS   traZODone  100 mg Oral QHS   Continuous Infusions:  sodium chloride     azithromycin 500 mg (08/02/21 1840)   cefTRIAXone (ROCEPHIN)  IV 1 g (08/02/21 1609)  LOS: 1 day    Time spent: over 30 min    Fayrene Helper, MD [facility]   To contact the attending provider between 7A-7P or the covering provider during after hours 7P-7A, please log into the web site [email or web address] and access using universal [place] password for that web site. If you do not have the password, please call the hospital operator.  08/02/2021, 6:54 PM

## 2021-08-02 NOTE — Progress Notes (Signed)
  Echocardiogram 2D Echocardiogram has been performed.  Matilde Bash 08/02/2021, 3:25 PM

## 2021-08-03 ENCOUNTER — Inpatient Hospital Stay (HOSPITAL_COMMUNITY): Payer: Medicare Other

## 2021-08-03 ENCOUNTER — Encounter (HOSPITAL_COMMUNITY): Payer: Self-pay | Admitting: Internal Medicine

## 2021-08-03 LAB — CBC WITH DIFFERENTIAL/PLATELET
Abs Immature Granulocytes: 0.05 10*3/uL (ref 0.00–0.07)
Basophils Absolute: 0 10*3/uL (ref 0.0–0.1)
Basophils Relative: 0 %
Eosinophils Absolute: 0 10*3/uL (ref 0.0–0.5)
Eosinophils Relative: 0 %
HCT: 30.5 % — ABNORMAL LOW (ref 36.0–46.0)
Hemoglobin: 10.1 g/dL — ABNORMAL LOW (ref 12.0–15.0)
Immature Granulocytes: 1 %
Lymphocytes Relative: 5 %
Lymphs Abs: 0.4 10*3/uL — ABNORMAL LOW (ref 0.7–4.0)
MCH: 27.1 pg (ref 26.0–34.0)
MCHC: 33.1 g/dL (ref 30.0–36.0)
MCV: 81.8 fL (ref 80.0–100.0)
Monocytes Absolute: 0.6 10*3/uL (ref 0.1–1.0)
Monocytes Relative: 7 %
Neutro Abs: 6.9 10*3/uL (ref 1.7–7.7)
Neutrophils Relative %: 87 %
Platelets: 446 10*3/uL — ABNORMAL HIGH (ref 150–400)
RBC: 3.73 MIL/uL — ABNORMAL LOW (ref 3.87–5.11)
RDW: 16.1 % — ABNORMAL HIGH (ref 11.5–15.5)
WBC: 8 10*3/uL (ref 4.0–10.5)
nRBC: 0 % (ref 0.0–0.2)

## 2021-08-03 LAB — COMPREHENSIVE METABOLIC PANEL
ALT: 50 U/L — ABNORMAL HIGH (ref 0–44)
AST: 50 U/L — ABNORMAL HIGH (ref 15–41)
Albumin: 3.1 g/dL — ABNORMAL LOW (ref 3.5–5.0)
Alkaline Phosphatase: 89 U/L (ref 38–126)
Anion gap: 7 (ref 5–15)
BUN: 11 mg/dL (ref 8–23)
CO2: 22 mmol/L (ref 22–32)
Calcium: 9.1 mg/dL (ref 8.9–10.3)
Chloride: 107 mmol/L (ref 98–111)
Creatinine, Ser: 0.62 mg/dL (ref 0.44–1.00)
GFR, Estimated: >60 mL/min (ref >60)
Glucose, Bld: 135 mg/dL — ABNORMAL HIGH (ref 70–99)
Potassium: 3.8 mmol/L (ref 3.5–5.1)
Sodium: 136 mmol/L (ref 135–145)
Total Bilirubin: 0.5 mg/dL (ref 0.3–1.2)
Total Protein: 6.7 g/dL (ref 6.5–8.1)

## 2021-08-03 LAB — BLOOD GAS, VENOUS
Acid-Base Excess: 0.8 mmol/L (ref 0.0–2.0)
Bicarbonate: 25.1 mmol/L (ref 20.0–28.0)
FIO2: 21
O2 Saturation: 92 %
Patient temperature: 98.6
pCO2, Ven: 40.8 mmHg — ABNORMAL LOW (ref 44.0–60.0)
pH, Ven: 7.405 (ref 7.250–7.430)
pO2, Ven: 62.1 mmHg — ABNORMAL HIGH (ref 32.0–45.0)

## 2021-08-03 LAB — PHOSPHORUS: Phosphorus: 3.1 mg/dL (ref 2.5–4.6)

## 2021-08-03 LAB — LEGIONELLA PNEUMOPHILA SEROGP 1 UR AG: L. pneumophila Serogp 1 Ur Ag: NEGATIVE

## 2021-08-03 LAB — BRAIN NATRIURETIC PEPTIDE: B Natriuretic Peptide: 1051 pg/mL — ABNORMAL HIGH (ref 0.0–100.0)

## 2021-08-03 LAB — MRSA NEXT GEN BY PCR, NASAL: MRSA by PCR Next Gen: NOT DETECTED

## 2021-08-03 LAB — MAGNESIUM: Magnesium: 2.1 mg/dL (ref 1.7–2.4)

## 2021-08-03 MED ORDER — ALBUTEROL SULFATE (2.5 MG/3ML) 0.083% IN NEBU: 2.5000 mg | INHALATION_SOLUTION | RESPIRATORY_TRACT | Status: DC | PRN

## 2021-08-03 MED ORDER — FUROSEMIDE 10 MG/ML IJ SOLN
40.0000 mg | Freq: Once | INTRAMUSCULAR | Status: AC
  Administered 2021-08-03: 40 mg via INTRAVENOUS

## 2021-08-03 MED ORDER — CHLORHEXIDINE GLUCONATE 0.12 % MT SOLN
15.0000 mL | Freq: Two times a day (BID) | OROMUCOSAL | Status: DC
  Administered 2021-08-03 – 2021-08-08 (×11): 15 mL via OROMUCOSAL
  Filled 2021-08-03 (×10): qty 15

## 2021-08-03 MED ORDER — FUROSEMIDE 10 MG/ML IJ SOLN
40.0000 mg | Freq: Two times a day (BID) | INTRAMUSCULAR | Status: DC
  Administered 2021-08-03 – 2021-08-08 (×11): 40 mg via INTRAVENOUS
  Filled 2021-08-03 (×12): qty 4

## 2021-08-03 MED ORDER — CHLORHEXIDINE GLUCONATE CLOTH 2 % EX PADS
6.0000 | MEDICATED_PAD | Freq: Every day | CUTANEOUS | Status: DC
  Administered 2021-08-03 – 2021-08-07 (×5): 6 via TOPICAL

## 2021-08-03 MED ORDER — ALBUTEROL SULFATE (2.5 MG/3ML) 0.083% IN NEBU: 2.5000 mg | INHALATION_SOLUTION | RESPIRATORY_TRACT | Status: DC

## 2021-08-03 MED ORDER — ORAL CARE MOUTH RINSE
15.0000 mL | Freq: Two times a day (BID) | OROMUCOSAL | Status: DC
  Administered 2021-08-03 – 2021-08-06 (×8): 15 mL via OROMUCOSAL

## 2021-08-03 MED ORDER — FUROSEMIDE 10 MG/ML IJ SOLN: 20.0000 mg | INTRAMUSCULAR | Status: DC

## 2021-08-03 MED ORDER — IPRATROPIUM-ALBUTEROL 0.5-2.5 (3) MG/3ML IN SOLN
3.0000 mL | Freq: Four times a day (QID) | RESPIRATORY_TRACT | Status: DC
  Administered 2021-08-03 (×2): 3 mL via RESPIRATORY_TRACT
  Filled 2021-08-03 (×2): qty 3

## 2021-08-03 MED ORDER — FUROSEMIDE 40 MG PO TABS
40.0000 mg | ORAL_TABLET | Freq: Two times a day (BID) | ORAL | Status: DC
  Administered 2021-08-03: 40 mg via ORAL
  Filled 2021-08-03: qty 1

## 2021-08-03 MED ORDER — MORPHINE SULFATE (PF) 2 MG/ML IV SOLN
1.0000 mg | Freq: Once | INTRAVENOUS | Status: AC
  Administered 2021-08-03: 1 mg via INTRAVENOUS
  Filled 2021-08-03: qty 1

## 2021-08-03 NOTE — Evaluation (Addendum)
SLP Cancellation Note  Patient Details Name: [name] MRN: [id number] DOB: 1946/01/31   Cancelled treatment:       Reason Eval/Treat Not Completed: Other (comment);Medical issues which prohibited therapy  Pt transferred to SD due to respiratory issues per RN on the floor.  Will continue efforts for swallow eval as respiratory status allows.  Pt currently on Bipap.    Kathleen Lime, MS [facility] SLP Acute Rehab Services Office [phone] Pager [phone]    Macario Golds 08/03/2021, 10:18 AM

## 2021-08-03 NOTE — Progress Notes (Signed)
   08/03/21 0016  Assess: MEWS Score  BP (!) 160/76  Pulse Rate (!) 109  Resp (!) 28  Level of Consciousness Alert  SpO2 90 % (big increase in O2 need - provider notified)  O2 Flow Rate (L/min) 6 L/min  Assess: MEWS Score  MEWS Temp 0  MEWS Systolic 0  MEWS Pulse 1  MEWS RR 2  MEWS LOC 0  MEWS Score 3  MEWS Score Color Yellow  Assess: if the MEWS score is Yellow or Red  Were vital signs taken at a resting state? Yes  Focused Assessment Change from prior assessment (see assessment flowsheet)  Does the patient meet 2 or more of the SIRS criteria? No  MEWS guidelines implemented *See Row Information* Yes  Treat  MEWS Interventions Escalated (See documentation below);Administered prn meds/treatments;Administered scheduled meds/treatments  Take Vital Signs  Increase Vital Sign Frequency  Yellow: Q 2hr X 2 then Q 4hr X 2, if remains yellow, continue Q 4hrs  Notify: Charge Nurse/RN  Name of Charge Nurse/RN Notified Stephanie, RN  Date Charge Nurse/RN Notified 08/03/21  Time Charge Nurse/RN Notified 0016  Notify: Provider  Provider Name/Title J. Olena Heckle, NP  Date Provider Notified 08/03/21  Time Provider Notified [phone]  Notification Type  (secure chat)  Notification Reason Change in status  Notify: Rapid Response  Name of Rapid Response RN Notified Erin, RN  Date Rapid Response Notified 08/03/21  Time Rapid Response Notified 0016  Assess: SIRS CRITERIA  SIRS Temperature  0  SIRS Pulse 1  SIRS Respirations  1  SIRS WBC 0  SIRS Score Sum  2  This RN walked in to check on patient and pt was extremely short of breath with audible expiratory wheezes, increased pulse and decreased O2 which was a significant change from prior. Oxygen increased with no decrease in work of breathing - provider notified, Agricultural consultant notified and rapid response called. Pt seemed to have improvement in work of breathing after breathing treatments. CPAP placed overnight. Pt resting now. Will continue to  monitor.

## 2021-08-03 NOTE — Progress Notes (Addendum)
PROGRESS NOTE    [name]  QQP:[id number] DOB: 10-21-1945 DOA: 07/31/2021 PCP: [place], [name], MD   Chief Complaint  Patient presents with   Flank Pain   Back Pain   Brief Narrative:  75 y.o. female with medical history significant of systolic CHF, hypertension, hypothyroidism, hyperlipidemia, PVD, GERD, PAD, CAD, OSA, chronic respiratory failure on 2L oxygen [place] prn, chronic mesenteric ischemia and breast cancer who presented with left thoracic pain under her left breast that started about 2 days prior to admission.  Pain was described as [name] sharp stabbing pain.  In the emergency department she underwent work-up including chest x-ray, CT angiogram chest along with CT abdomen and pelvis.  No PE was noted.  Pneumonia was noted.  Patient was hospitalized for further management.  Currently being treated for CAP and COPD exacerbation.  She's now developed heart failure, transfer to stepdown for bipap.  Will follow for need for pulm involvement and evaluation.  Assessment & Plan:   Principal Problem:   Sepsis due to pneumonia [facility]) Active Problems:   Hypothyroidism   Hyperlipidemia   Essential hypertension   COPD, frequent exacerbations ([place])   GERD   PAD (peripheral artery disease) (HCC)   Tobacco abuse   CAD (coronary artery disease)   Chronic respiratory failure with hypoxia (HCC)   OSA (obstructive sleep apnea)   Hypokalemia   Prediabetes   Systolic CHF, chronic ([place])   CAP (community acquired pneumonia)  Addendum Seems more comfortable on bipap (on reevaluations throughout day), I'm hopeful she'll turn around with this.  Continue diuresis, scheduled/prn nebs, bipap overnight for now. Ok for Jex [place] break for dinner, then resume bipap overnight  Acute Hypoxic Respiratory Failure Thought 2/2 HF and COPD exacerbation Treatment as below Increased wob overnight, presumed 2/2 HF -> transfer to stepdown for bipap Follow [facility] Consider pulm involvement, will follow her progress on  bipap  Chronic systolic and diastolic diastolic CHF Last echocardiogram from 2021 showed mildly depressed EF of 45 to 50%.  Repeat echo with EF 71-24%, grade 2 diastolic dysfunction Lasix initially held due to hypotension, now resolved -> now appears she's overloaded IV lasix, follow - doesn't appear grossly overloaded, but she does have mildly distended abdomen, she notes she carries fluid in her belly -> weight up 6 kg since admission, net positive 1.6 L  History of COPD, Gold stage III/chronic respiratory failure with hypoxia/tobacco abuse Uses 2 L of oxygen at home but mainly at nighttime.  Occasional wheezing is present.  Continue just with the nebulizer treatments and her inhaler therapy. Getting solucortef given concern for adrenal insufficiency below Continue scheduled and prn nebs.    Community-acquired pneumonia  Sepsis Patient was noted to have leukocytosis, tachycardia, tachypnea.  Noted to have elevated lactic acid level at 3.0.  Follow-up culture data - pending blood cultures - NGTDx2 days Follow sputum cx, MRSA PCR Urine strep negative, urine legionella pending RVP negative.  Negative influenza, covid. Continue ceftriaxone, azithromycin Continues on 6 L [place] CXR 10/20 with lingular consolidation, trace R effusion WBC count improved, don't think we need to broaden abx, worsening suspected due to HF and COPD   Hypotension/possible adrenal insufficiency Suspect 2/2 above BP improved Continue solucortef for now, will try to wean quickly if BP's improve - discharged on steroid taper in May, doesn't seem like it was continued until this admission per discussion Lasix resumed as above now with HF exacerbation Echo with hypotension Most recent BP improved, follow closely  History of coronary  artery disease/peripheral artery disease/chronic mesenteric ischemia Underwent heart cath in 2021 which did not show any significant obstructive CAD. She had history of left subclavian  stenting in the past.  Mesenteric disease is being treated medically.   She is on aspirin and Plavix, Pletal as well as statin treatment.   Normocytic anemia Drop in hemoglobin appears to be dilutional.  No evidence of overt blood loss noted.  Baseline hemoglobin seems to be around 10-12. Low normal B12, follow MMA - supplement Iron def noted as well - supplement   Hypokalemia Follow with diuresis  Obstructive sleep apnea Is supposed to be on CPAP at night but does not use it like she should.  Hypothyroidism Continue levothyroxine.  History of GERD Continue PPI   History of anxiety/depression/insomnia Continue home medications.  DVT prophylaxis: lovenox Code Status: DNR Family Communication: none at bedside Disposition:   Status is: Inpatient  Remains inpatient appropriate because: need for IV abx     Consultants:  none  Procedures:  Echo IMPRESSIONS     1. Left ventricular ejection fraction, by estimation, is 55 to 60%. The  left ventricle has normal function. The left ventricle has no regional  wall motion abnormalities. Left ventricular diastolic parameters are  consistent with Grade II diastolic  dysfunction (pseudonormalization).   2. Right ventricular systolic function is normal. The right ventricular  size is normal. Tricuspid regurgitation signal is inadequate for assessing  PA pressure.   3. The mitral valve is normal in structure. No evidence of mitral valve  regurgitation. No evidence of mitral stenosis.   4. The aortic valve is normal in structure. Aortic valve regurgitation is  not visualized. No aortic stenosis is present.   5. The inferior vena cava is dilated in size with >50% respiratory  variability, suggesting right atrial pressure of 8 mmHg.   Comparison(s): Compared to prior echo in 05/21/2020, there has been  improvement in the Left ventricular ejection fraction was 45-50% now  55-60%.   Antimicrobials:  Anti-infectives (From  admission, onward)    Start     Dose/Rate Route Frequency Ordered Stop   08/01/21 1800  azithromycin (ZITHROMAX) 500 mg in sodium chloride 0.9 % 250 mL IVPB        500 mg 250 mL/hr over 60 Minutes Intravenous Every 24 hours 07/31/21 1709 08/06/21 1759   08/01/21 1500  cefTRIAXone (ROCEPHIN) 1 g in sodium chloride 0.9 % 100 mL IVPB        1 g 200 mL/hr over 30 Minutes Intravenous Every 24 hours 07/31/21 1708     07/31/21 1500  cefTRIAXone (ROCEPHIN) 1 g in sodium chloride 0.9 % 100 mL IVPB        1 g 200 mL/hr over 30 Minutes Intravenous  Once 07/31/21 1454 07/31/21 1552   07/31/21 1500  azithromycin (ZITHROMAX) 500 mg in sodium chloride 0.9 % 250 mL IVPB        500 mg 250 mL/hr over 60 Minutes Intravenous  Once 07/31/21 1454 07/31/21 1850          Subjective: Terrible night, feels terrible  Objective: Vitals:   08/03/21 0429 08/03/21 0500 08/03/21 0849 08/03/21 0934  BP: 119/68   (!) 149/84  Pulse: 79   99  Resp: 18   18  Temp: 98 F (36.7 C)   98.5 F (36.9 C)  TempSrc: Oral   Oral  SpO2: 96%  93% 95%  Weight:  65.1 kg    Height:  Intake/Output Summary (Last 24 hours) at 08/03/2021 0936 Last data filed at 08/02/2021 2200 Gross per 24 hour  Intake 1102.8 ml  Output --  Net 1102.8 ml   Filed Weights   08/01/21 0411 08/02/21 0435 08/03/21 0500  Weight: 63.9 kg 64 kg 65.1 kg    Examination:  General: respiratory distress Cardiovascular: RRR Lungs: transmitted upper airway sounds, tachypnea, accessory muscle use Abdomen: Soft, nontender, mildly distended Neurological: Alert and oriented 3. Moves all extremities 4. Cranial nerves II through XII grossly intact. Skin: Warm and dry. No rashes or lesions. Extremities: No clubbing or cyanosis. No edema.    Data Reviewed: I have personally reviewed following labs and imaging studies  CBC: Recent Labs  Lab 07/31/21 1150 08/01/21 0503 08/02/21 0519 08/03/21 0544  WBC 26.3* 14.5* 8.3 8.0  NEUTROABS  23.5*  --   --  6.9  HGB 11.5* 9.7* 9.8* 10.1*  HCT 34.6* 29.8* 31.3* 30.5*  MCV 80.8 82.5 84.4 81.8  PLT 435* 338 382 446*    Basic Metabolic Panel: Recent Labs  Lab 07/31/21 1150 07/31/21 1729 08/01/21 0503 08/02/21 0519 08/03/21 0544  NA 134*  --  138 135 136  K 3.4*  --  3.7 4.4 3.8  CL 101  --  107 106 107  CO2 26  --  26 23 22   GLUCOSE 140*  --  89 163* 135*  BUN 16  --  13 13 11   CREATININE 0.89  --  0.89 0.63 0.62  CALCIUM 9.2  --  8.5* 8.6* 9.1  MG  --  1.7  --   --  2.1  PHOS  --   --   --   --  3.1    GFR: Estimated Creatinine Clearance: 56 mL/min (by C-G formula based on SCr of 0.62 mg/dL).  Liver Function Tests: Recent Labs  Lab 07/31/21 1150 08/03/21 0544  AST 31 50*  ALT 24 50*  ALKPHOS 76 89  BILITOT 0.7 0.5  PROT 7.6 6.7  ALBUMIN 3.7 3.1*    CBG: No results for input(s): GLUCAP in the last 168 hours.   Recent Results (from the past 240 hour(s))  Respiratory (~20 pathogens) panel by PCR     Status: None   Collection Time: 07/31/21 11:07 AM   Specimen: Nasopharyngeal Swab; Respiratory  Result Value Ref Range Status   Adenovirus NOT DETECTED NOT DETECTED Final   Coronavirus 229E NOT DETECTED NOT DETECTED Final    Comment: (NOTE) The Coronavirus on the Respiratory Panel, DOES NOT test for the novel  Coronavirus (2019 nCoV)    Coronavirus HKU1 NOT DETECTED NOT DETECTED Final   Coronavirus NL63 NOT DETECTED NOT DETECTED Final   Coronavirus OC43 NOT DETECTED NOT DETECTED Final   Metapneumovirus NOT DETECTED NOT DETECTED Final   Rhinovirus / Enterovirus NOT DETECTED NOT DETECTED Final   Influenza Chaundra Abreu NOT DETECTED NOT DETECTED Final   Influenza B NOT DETECTED NOT DETECTED Final   Parainfluenza Virus 1 NOT DETECTED NOT DETECTED Final   Parainfluenza Virus 2 NOT DETECTED NOT DETECTED Final   Parainfluenza Virus 3 NOT DETECTED NOT DETECTED Final   Parainfluenza Virus 4 NOT DETECTED NOT DETECTED Final   Respiratory Syncytial Virus NOT DETECTED  NOT DETECTED Final   Bordetella pertussis NOT DETECTED NOT DETECTED Final   Bordetella Parapertussis NOT DETECTED NOT DETECTED Final   Chlamydophila pneumoniae NOT DETECTED NOT DETECTED Final   Mycoplasma pneumoniae NOT DETECTED NOT DETECTED Final    Comment: Performed at [facility]  Lab, [address] [place], [place] [address]  Resp Panel by RT-PCR (Flu [name]&B, Covid) Nasopharyngeal Swab     Status: None   Collection Time: 07/31/21  3:27 PM   Specimen: Nasopharyngeal Swab; Nasopharyngeal(NP) swabs in vial transport medium  Result Value Ref Range Status   SARS Coronavirus 2 by RT PCR NEGATIVE NEGATIVE Final    Comment: (NOTE) SARS-CoV-2 target nucleic acids are NOT DETECTED.  The SARS-CoV-2 RNA is generally detectable in upper respiratory specimens during the acute phase of infection. The lowest concentration of SARS-CoV-2 viral copies this assay can detect is 138 copies/mL. Reeya Bound negative result does not preclude SARS-Cov-2 infection and should not be used as the sole basis for treatment or other patient management decisions. Yahayra Geis negative result may occur with  improper specimen collection/handling, submission of specimen other than nasopharyngeal swab, presence of viral mutation(s) within the areas targeted by this assay, and inadequate number of viral copies(<138 copies/mL). Arlester Keehan negative result must be combined with clinical observations, patient history, and epidemiological information. The expected result is Negative.  Fact Sheet for Patients:  EntrepreneurPulse.com.au  Fact Sheet for Healthcare Providers:  IncredibleEmployment.be  This test is no t yet approved or cleared by the [place] FDA and  has been authorized for detection and/or diagnosis of SARS-CoV-2 by FDA under an Emergency Use Authorization (EUA). This EUA will remain  in effect (meaning this test can be used) for the duration of the COVID-19 declaration under Section  564(b)(1) of the Act, 21 U.S.C.section 360bbb-3(b)(1), unless the authorization is terminated  or revoked sooner.       Influenza [place] Straus by PCR NEGATIVE NEGATIVE Final   Influenza B by PCR NEGATIVE NEGATIVE Final    Comment: (NOTE) The Xpert Xpress SARS-CoV-2/FLU/RSV plus assay is intended as an aid in the diagnosis of influenza from Nasopharyngeal swab specimens and should not be used as Karysa Heft sole basis for treatment. Nasal washings and aspirates are unacceptable for Xpert Xpress SARS-CoV-2/FLU/RSV testing.  Fact Sheet for Patients: EntrepreneurPulse.com.au  Fact Sheet for Healthcare Providers: IncredibleEmployment.be  This test is not yet approved or cleared by the [place] FDA and has been authorized for detection and/or diagnosis of SARS-CoV-2 by FDA under an Emergency Use Authorization (EUA). This EUA will remain in effect (meaning this test can be used) for the duration of the COVID-19 declaration under Section 564(b)(1) of the Act, 21 U.S.C. section 360bbb-3(b)(1), unless the authorization is terminated or revoked.  Performed at [facility], [place] [address] [place], [place] [address]   Blood culture (routine x 2)     Status: None (Preliminary result)   Collection Time: 07/31/21  5:27 PM   Specimen: BLOOD LEFT FOREARM  Result Value Ref Range Status   Specimen Description   Final    BLOOD LEFT FOREARM Performed at [facility] [address], [place], [place] [address]    Special Requests   Final    BOTTLES DRAWN AEROBIC AND ANAEROBIC Blood Culture results may not be optimal due to an inadequate volume of blood received in culture bottles Performed at [facility] [address], [place], [place] [address]    Culture   Final    NO GROWTH 2 DAYS Performed at [facility] [address], [place], [place] [address]    Report Status PENDING  Incomplete  Blood culture  (routine x 2)     Status: None (Preliminary result)   Collection Time: 07/31/21  7:52 PM  Specimen: BLOOD LEFT WRIST  Result Value Ref Range Status   Specimen Description   Final    BLOOD LEFT WRIST Performed at [facility] [address], [place], [place] [address]    Special Requests   Final    Blood Culture results may not be optimal due to an inadequate volume of blood received in culture bottles BOTTLES DRAWN AEROBIC ONLY Performed at [facility], [place] [address] [place], [place] [address]    Culture   Final    NO GROWTH 2 DAYS Performed at [facility] [address] [place],  [address]    Report Status PENDING  Incomplete         Radiology Studies: DG Chest Port 1 View  Result Date: 08/03/2021 CLINICAL DATA:  Acute respiratory distress EXAM: PORTABLE CHEST 1 VIEW COMPARISON:  08/02/2021 FINDINGS: Cardiac and mediastinal silhouette within normal limits. Postsurgical changes in the right upper lung, with surgical clips along the right chest wall. Unchanged lingular airspace disease. Blunting of the right costophrenic angle, possibly Phyllistine Domingos trace pleural effusion. No acute osseous abnormality. IMPRESSION: Unchanged lingular consolidation. Possible trace right pleural effusion. Electronically Signed   By: Merilyn Baba M.D.   On: 08/03/2021 00:54   DG CHEST PORT 1 VIEW  Result Date: 08/02/2021 CLINICAL DATA:  CHF. EXAM: PORTABLE CHEST 1 VIEW COMPARISON:  Chest x-ray and chest CT 07/31/2021. FINDINGS: Cardiomediastinal silhouette is within normal limits. Postsurgical changes are again seen in the right upper lung. Surgical clips are noted in the right chest wall, unchanged. Lingular airspace disease appears unchanged from the prior examination. There is no pleural effusion or pneumothorax. No acute fractures are seen. IMPRESSION: 1. Unchanged lingular consolidation. Electronically Signed   By: Ronney Asters M.D.   On: 08/02/2021 15:43    ECHOCARDIOGRAM COMPLETE  Result Date: 08/02/2021    ECHOCARDIOGRAM REPORT   Patient Name:   KIERAH GOATLEY Date of Exam: 08/02/2021 Medical Rec #:  [id number]     Height:       66.0 in Accession #:    [id number]    Weight:       141.1 lb Date of Birth:  22-Dec-1945     BSA:          1.724 m Patient Age:    44 years      BP:           141/72 mmHg Patient Gender: F             HR:           80 bpm. Exam Location:  Inpatient Procedure: 2D Echo, Cardiac Doppler and Color Doppler Indications:    Dyspnea  History:        Patient has prior history of Echocardiogram examinations, most                 recent 05/21/2020. CHF, CAD, PVD, Signs/Symptoms:Dyspnea and                 Pneumonia; Risk Factors:Hypertension, Dyslipidemia and Sleep                 Apnea. Resp. failure.  Sonographer:    Dustin Flock RDCS Referring Phys: [phone] Christy Beckmann CALDWELL POWELL [name]  Sonographer Comments: Technically challenging study due to limited acoustic windows. Image acquisition challenging due to respiratory motion. Patient has pneumonia IMPRESSIONS  1. Left ventricular ejection fraction, by estimation, is 55 to 60%. The left ventricle has normal function.  The left ventricle has no regional wall motion abnormalities. Left ventricular diastolic parameters are consistent with Grade II diastolic dysfunction (pseudonormalization).  2. Right ventricular systolic function is normal. The right ventricular size is normal. Tricuspid regurgitation signal is inadequate for assessing PA pressure.  3. The mitral valve is normal in structure. No evidence of mitral valve regurgitation. No evidence of mitral stenosis.  4. The aortic valve is normal in structure. Aortic valve regurgitation is not visualized. No aortic stenosis is present.  5. The inferior vena cava is dilated in size with >50% respiratory variability, suggesting right atrial pressure of 8 mmHg. Comparison(s): Compared to prior echo in 05/21/2020, there has been improvement in the Left  ventricular ejection fraction was 45-50% now 55-60%. FINDINGS  Left Ventricle: Left ventricular ejection fraction, by estimation, is 55 to 60%. The left ventricle has normal function. The left ventricle has no regional wall motion abnormalities. The left ventricular internal cavity size was normal in size. There is  no left ventricular hypertrophy. Left ventricular diastolic parameters are consistent with Grade II diastolic dysfunction (pseudonormalization). Right Ventricle: The right ventricular size is normal. No increase in right ventricular wall thickness. Right ventricular systolic function is normal. Tricuspid regurgitation signal is inadequate for assessing PA pressure. Left Atrium: Left atrial size was normal in size. Right Atrium: Right atrial size was normal in size. Pericardium: There is no evidence of pericardial effusion. Mitral Valve: The mitral valve is normal in structure. Mild to moderate mitral annular calcification. No evidence of mitral valve regurgitation. No evidence of mitral valve stenosis. Tricuspid Valve: The tricuspid valve is normal in structure. Tricuspid valve regurgitation is not demonstrated. No evidence of tricuspid stenosis. Aortic Valve: The aortic valve is normal in structure. Aortic valve regurgitation is not visualized. No aortic stenosis is present. Pulmonic Valve: The pulmonic valve was normal in structure. Pulmonic valve regurgitation is not visualized. No evidence of pulmonic stenosis. Aorta: The aortic root is normal in size and structure. Venous: The inferior vena cava is dilated in size with greater than 50% respiratory variability, suggesting right atrial pressure of 8 mmHg. IAS/Shunts: No atrial level shunt detected by color flow Doppler.  LEFT VENTRICLE PLAX 2D LVIDd:         4.40 cm   Diastology LVIDs:         2.40 cm   LV e' medial:    4.35 cm/s LV PW:         1.00 cm   LV E/e' medial:  21.7 LV IVS:        1.00 cm   LV e' lateral:   7.18 cm/s LVOT diam:     2.10 cm    LV E/e' lateral: 13.1 LV SV:         65 LV SV Index:   38 LVOT Area:     3.46 cm  RIGHT VENTRICLE RV Basal diam:  3.00 cm RV S prime:     9.46 cm/s TAPSE (M-mode): 1.8 cm LEFT ATRIUM             Index        RIGHT ATRIUM           Index LA diam:        4.60 cm 2.67 cm/m   RA Area:     11.80 cm LA Vol (A2C):   52.3 ml 30.33 ml/m  RA Volume:   26.20 ml  15.20 ml/m LA Vol (A4C):   24.8 ml 14.38 ml/m LA Biplane Vol:  37.7 ml 21.87 ml/m  AORTIC VALVE LVOT Vmax:   87.90 cm/s LVOT Vmean:  55.100 cm/s LVOT VTI:    0.188 m  AORTA Ao Root diam: 2.90 cm MITRAL VALVE MV Area (PHT): 4.93 cm    SHUNTS MV Decel Time: 154 msec    Systemic VTI:  0.19 m MV E velocity: 94.30 cm/s  Systemic Diam: 2.10 cm MV Garison Genova velocity: 85.30 cm/s MV E/Aloura Matsuoka ratio:  1.11 [name] Electronically signed by [name] Signature Date/Time: 08/02/2021/6:11:22 PM    Final         Scheduled Meds:  aspirin EC  81 mg Oral Daily   atorvastatin  80 mg Oral Daily   budesonide  0.5 mg Nebulization BID   cilostazol  100 mg Oral BID   clopidogrel  75 mg Oral Daily   enoxaparin (LOVENOX) injection  40 mg Subcutaneous Q24H   ferrous sulfate  325 mg Oral Q breakfast   fluticasone  1 spray Each Nare BID   fluticasone furoate-vilanterol  1 puff Inhalation Daily   And   umeclidinium bromide  1 puff Inhalation Daily   furosemide  40 mg Intravenous BID   guaiFENesin  600 mg Oral Daily   hydrocortisone sod succinate (SOLU-CORTEF) inj  100 mg Intravenous Q8H   hydrOXYzine  25 mg Oral QHS   ipratropium-albuterol  3 mL Nebulization Q6H   levothyroxine  112 mcg Oral QAC breakfast   LORazepam  1 mg Oral QHS   montelukast  10 mg Oral QHS   pantoprazole  80 mg Oral Q1200   PARoxetine  20 mg Oral QHS   sodium chloride flush  3 mL Intravenous Q12H   tamsulosin  0.4 mg Oral QHS   traZODone  100 mg Oral QHS   vitamin B-12  1,000 mcg Oral Daily   Continuous Infusions:  sodium chloride     azithromycin Stopped (08/02/21 1944)   cefTRIAXone  (ROCEPHIN)  IV Stopped (08/02/21 1642)     LOS: 2 days    Time spent: over 30 min    Fayrene Helper, MD [facility]   To contact the attending provider between 7A-7P or the covering provider during after hours 7P-7A, please log into the web site [email or web address] and access using universal [place] password for that web site. If you [name] not have the password, please call the hospital operator.  08/03/2021, 9:36 AM

## 2021-08-04 LAB — CBC WITH DIFFERENTIAL/PLATELET
Abs Immature Granulocytes: 0.05 10*3/uL (ref 0.00–0.07)
Basophils Absolute: 0 10*3/uL (ref 0.0–0.1)
Basophils Relative: 0 %
Eosinophils Absolute: 0 10*3/uL (ref 0.0–0.5)
Eosinophils Relative: 0 %
HCT: 31.8 % — ABNORMAL LOW (ref 36.0–46.0)
Hemoglobin: 10.3 g/dL — ABNORMAL LOW (ref 12.0–15.0)
Immature Granulocytes: 1 %
Lymphocytes Relative: 7 %
Lymphs Abs: 0.5 10*3/uL — ABNORMAL LOW (ref 0.7–4.0)
MCH: 26.3 pg (ref 26.0–34.0)
MCHC: 32.4 g/dL (ref 30.0–36.0)
MCV: 81.1 fL (ref 80.0–100.0)
Monocytes Absolute: 0.3 10*3/uL (ref 0.1–1.0)
Monocytes Relative: 5 %
Neutro Abs: 5.6 10*3/uL (ref 1.7–7.7)
Neutrophils Relative %: 87 %
Platelets: 462 10*3/uL — ABNORMAL HIGH (ref 150–400)
RBC: 3.92 MIL/uL (ref 3.87–5.11)
RDW: 15.9 % — ABNORMAL HIGH (ref 11.5–15.5)
WBC: 6.5 10*3/uL (ref 4.0–10.5)
nRBC: 0 % (ref 0.0–0.2)

## 2021-08-04 LAB — COMPREHENSIVE METABOLIC PANEL
ALT: 39 U/L (ref 0–44)
AST: 30 U/L (ref 15–41)
Albumin: 2.9 g/dL — ABNORMAL LOW (ref 3.5–5.0)
Alkaline Phosphatase: 78 U/L (ref 38–126)
Anion gap: 6 (ref 5–15)
BUN: 14 mg/dL (ref 8–23)
CO2: 30 mmol/L (ref 22–32)
Calcium: 8.3 mg/dL — ABNORMAL LOW (ref 8.9–10.3)
Chloride: 102 mmol/L (ref 98–111)
Creatinine, Ser: 0.75 mg/dL (ref 0.44–1.00)
GFR, Estimated: >60 mL/min (ref >60)
Glucose, Bld: 122 mg/dL — ABNORMAL HIGH (ref 70–99)
Potassium: 2.6 mmol/L — CL (ref 3.5–5.1)
Sodium: 138 mmol/L (ref 135–145)
Total Bilirubin: 0.4 mg/dL (ref 0.3–1.2)
Total Protein: 6.4 g/dL — ABNORMAL LOW (ref 6.5–8.1)

## 2021-08-04 LAB — BASIC METABOLIC PANEL
Anion gap: 11 (ref 5–15)
BUN: 20 mg/dL (ref 8–23)
CO2: 28 mmol/L (ref 22–32)
Calcium: 8.6 mg/dL — ABNORMAL LOW (ref 8.9–10.3)
Chloride: 97 mmol/L — ABNORMAL LOW (ref 98–111)
Creatinine, Ser: 0.72 mg/dL (ref 0.44–1.00)
GFR, Estimated: >60 mL/min (ref >60)
Glucose, Bld: 185 mg/dL — ABNORMAL HIGH (ref 70–99)
Potassium: 3.7 mmol/L (ref 3.5–5.1)
Sodium: 136 mmol/L (ref 135–145)

## 2021-08-04 LAB — MAGNESIUM: Magnesium: 1.9 mg/dL (ref 1.7–2.4)

## 2021-08-04 LAB — PHOSPHORUS: Phosphorus: 3.3 mg/dL (ref 2.5–4.6)

## 2021-08-04 LAB — BRAIN NATRIURETIC PEPTIDE: B Natriuretic Peptide: 1261.1 pg/mL — ABNORMAL HIGH (ref 0.0–100.0)

## 2021-08-04 MED ORDER — IPRATROPIUM-ALBUTEROL 0.5-2.5 (3) MG/3ML IN SOLN
3.0000 mL | Freq: Four times a day (QID) | RESPIRATORY_TRACT | Status: DC
  Administered 2021-08-04 (×3): 3 mL via RESPIRATORY_TRACT
  Filled 2021-08-04 (×3): qty 3

## 2021-08-04 MED ORDER — POTASSIUM CHLORIDE 10 MEQ/100ML IV SOLN
10.0000 meq | INTRAVENOUS | Status: AC
Start: 2021-08-04 — End: 2021-08-04
  Administered 2021-08-04 (×4): 10 meq via INTRAVENOUS
  Filled 2021-08-04 (×4): qty 100

## 2021-08-04 MED ORDER — IPRATROPIUM-ALBUTEROL 0.5-2.5 (3) MG/3ML IN SOLN
3.0000 mL | Freq: Three times a day (TID) | RESPIRATORY_TRACT | Status: DC
  Administered 2021-08-05 – 2021-08-08 (×10): 3 mL via RESPIRATORY_TRACT
  Filled 2021-08-04 (×11): qty 3

## 2021-08-04 MED ORDER — SALINE SPRAY 0.65 % NA SOLN
1.0000 | NASAL | Status: DC | PRN
  Filled 2021-08-04: qty 44

## 2021-08-04 MED ORDER — POTASSIUM CHLORIDE CRYS ER 20 MEQ PO TBCR
40.0000 meq | EXTENDED_RELEASE_TABLET | Freq: Two times a day (BID) | ORAL | Status: AC
  Administered 2021-08-04 (×2): 40 meq via ORAL
  Filled 2021-08-04 (×2): qty 2

## 2021-08-04 NOTE — Evaluation (Signed)
SLP Cancellation Note  Patient Details Name: [name] MRN: [id number] DOB: 1946-08-13   Cancelled treatment:        Pt on Bipap, will continue efforts.  Has h/o esophageal dysmotility and small to moderate hiatal hernia per prior esophagram 05/2020.   Kathleen Lime, MS [facility] SLP Acute Rehab Services Office [phone] Pager [phone]    Macario Golds 08/04/2021, 2:31 PM

## 2021-08-04 NOTE — Progress Notes (Signed)
PROGRESS NOTE    LASHAE [name]  AUQ:[id number] DOB: Dec 20, 1945 DOA: 07/31/2021 PCP: [place], [name], MD   Chief Complaint  Patient presents with   Flank Pain   Back Pain   Brief Narrative:  75 y.o. female with medical history significant of systolic CHF, hypertension, hypothyroidism, hyperlipidemia, PVD, GERD, PAD, CAD, OSA, chronic respiratory failure on 2L oxygen [place] prn, chronic mesenteric ischemia and breast cancer who presented with left thoracic pain under her left breast that started about 2 days prior to admission.  Pain was described as Jerri Hargadon sharp stabbing pain.  In the emergency department she underwent work-up including chest x-ray, CT angiogram chest along with CT abdomen and pelvis.  No PE was noted.  Pneumonia was noted.  Patient was hospitalized for further management.  Currently being treated for CAP and COPD exacerbation.  She's now developed heart failure, transfer to stepdown for bipap.  Will follow for need for pulm involvement and evaluation.  Assessment & Plan:   Principal Problem:   Sepsis due to pneumonia [facility]) Active Problems:   Hypothyroidism   Hyperlipidemia   Essential hypertension   COPD, frequent exacerbations ([place])   GERD   PAD (peripheral artery disease) ([place])   Tobacco abuse   CAD (coronary artery disease)   Chronic respiratory failure with hypoxia (H[facility])   OSA (obstructive sleep apnea)   Hypokalemia   Prediabetes   Systolic CHF, chronic ([place])   CAP (community acquired pneumonia)  Acute Hypoxic Respiratory Failure Thought 2/2 HF and COPD exacerbation as well as pneumonia Required transfer to stepdown 10/20 for bipap Better today, will continue bipap as needed and nightly   Acute on Chronic systolic and diastolic diastolic CHF  Volume Overload Last echocardiogram from 2021 showed mildly depressed EF of 45 to 50%.  Repeat echo with EF 63-89%, grade 2 diastolic dysfunction Lasix initially held due to hypotension, now resolved -> now appears she's  overloaded Weight improved, follow  Net negative Continue IV lasix, follow - doesn't appear grossly overloaded, but she does have mildly distended abdomen, she notes she carries fluid in her belly  COPD Exacerbation  History of COPD, Gold stage III/chronic respiratory failure with hypoxia/tobacco abuse Uses 2 L of oxygen at home but mainly at nighttime.   Continuing to wheeze and have coarse cough On abx as noted below Getting solucortef given concern for adrenal insufficiency below (lower suspicion)  Continue scheduled and prn nebs.    Community-acquired pneumonia  Sepsis Patient was noted to have leukocytosis, tachycardia, tachypnea.  Noted to have elevated lactic acid level at 3.0.  Follow-up culture data - pending blood cultures - NGTDx2 days Follow sputum cx pending, MRSA PCR negative Urine strep negative, urine legionella negative RVP negative.  Negative influenza, covid. Continue ceftriaxone, azithromycin (10/17-present) Required BIPAP and transfer to stepdown yesterday as noted above, wean O2 as able CXR 10/20 with lingular consolidation, trace R effusion WBC count improved, don't think we need to broaden abx, worsening suspected due to HF and COPD   Hypotension/possible adrenal insufficiency Suspect 2/2 above BP improved Continue solucortef for now, will try to taper steroids as able - discharged on steroid taper in May, doesn't seem like it was continued until this admission per discussion (I have lower suspicion for adrenal insufficiency)  Lasix resumed as above now with HF exacerbation Echo with hypotension as noted below Most recent BP improved, follow closely  History of coronary artery disease/peripheral artery disease/chronic mesenteric ischemia Underwent heart cath in 2021 which did not show  any significant obstructive CAD. She had history of left subclavian stenting in the past.  Mesenteric disease is being treated medically.   She is on aspirin and Plavix,  Pletal as well as statin treatment.   Normocytic anemia Drop in hemoglobin appears to be dilutional.  No evidence of overt blood loss noted.  Baseline hemoglobin seems to be around 10-12. Low normal B12, follow MMA - supplement Iron def noted as well - supplement   Hypokalemia Replace PO/Oral, follow Mag ok  Obstructive sleep apnea Is supposed to be on CPAP at night but does not use it like she should.  Hypothyroidism Continue levothyroxine.  History of GERD Continue PPI   History of anxiety/depression/insomnia Continue home medications.  DVT prophylaxis: lovenox Code Status: DNR Family Communication: none at bedside - husband at bedside 10/20 Disposition:   Status is: Inpatient  Remains inpatient appropriate because: need for IV abx     Consultants:  none  Procedures:  Echo IMPRESSIONS     1. Left ventricular ejection fraction, by estimation, is 55 to 60%. The  left ventricle has normal function. The left ventricle has no regional  wall motion abnormalities. Left ventricular diastolic parameters are  consistent with Grade II diastolic  dysfunction (pseudonormalization).   2. Right ventricular systolic function is normal. The right ventricular  size is normal. Tricuspid regurgitation signal is inadequate for assessing  PA pressure.   3. The mitral valve is normal in structure. No evidence of mitral valve  regurgitation. No evidence of mitral stenosis.   4. The aortic valve is normal in structure. Aortic valve regurgitation is  not visualized. No aortic stenosis is present.   5. The inferior vena cava is dilated in size with >50% respiratory  variability, suggesting right atrial pressure of 8 mmHg.   Comparison(s): Compared to prior echo in 05/21/2020, there has been  improvement in the Left ventricular ejection fraction was 45-50% now  55-60%.   Antimicrobials:  Anti-infectives (From admission, onward)    Start     Dose/Rate Route Frequency Ordered Stop    08/01/21 1800  azithromycin (ZITHROMAX) 500 mg in sodium chloride 0.9 % 250 mL IVPB        500 mg 250 mL/hr over 60 Minutes Intravenous Every 24 hours 07/31/21 1709 08/06/21 1759   08/01/21 1500  cefTRIAXone (ROCEPHIN) 1 g in sodium chloride 0.9 % 100 mL IVPB        1 g 200 mL/hr over 30 Minutes Intravenous Every 24 hours 07/31/21 1708     07/31/21 1500  cefTRIAXone (ROCEPHIN) 1 g in sodium chloride 0.9 % 100 mL IVPB        1 g 200 mL/hr over 30 Minutes Intravenous  Once 07/31/21 1454 07/31/21 1552   07/31/21 1500  azithromycin (ZITHROMAX) 500 mg in sodium chloride 0.9 % 250 mL IVPB        500 mg 250 mL/hr over 60 Minutes Intravenous  Once 07/31/21 1454 07/31/21 1850          Subjective: Feeling better today, breathing better, less abdominal distension  Objective: Vitals:   08/04/21 0528 08/04/21 0600 08/04/21 0800 08/04/21 0802  BP:  (!) 146/73    Pulse: 80 81    Resp: (!) 23 17    Temp:      TempSrc:      SpO2: 97% 94% 95% 95%  Weight:      Height:        Intake/Output Summary (Last 24 hours) at 08/04/2021 0809 Last  data filed at 08/04/2021 0600 Gross per 24 hour  Intake 457.8 ml  Output 4150 ml  Net -3692.2 ml   Filed Weights   08/03/21 0500 08/03/21 1000 08/04/21 0500  Weight: 65.1 kg 64 kg 61.1 kg    Examination:  General: No acute distress. Cardiovascular:RRR. Lungs: coarse couugh with deep breaths, coarse wheezing throughout Abdomen: Soft, nontender, mild distension  Neurological: Alert and oriented 3. Moves all extremities 4 . Cranial nerves II through XII grossly intact. Skin: Warm and dry. No rashes or lesions. Extremities: No clubbing or cyanosis. No edema.   Data Reviewed: I have personally reviewed following labs and imaging studies  CBC: Recent Labs  Lab 07/31/21 1150 08/01/21 0503 08/02/21 0519 08/03/21 0544 08/04/21 0238  WBC 26.3* 14.5* 8.3 8.0 6.5  NEUTROABS 23.5*  --   --  6.9 5.6  HGB 11.5* 9.7* 9.8* 10.1* 10.3*  HCT 34.6*  29.8* 31.3* 30.5* 31.8*  MCV 80.8 82.5 84.4 81.8 81.1  PLT 435* 338 382 446* 462*    Basic Metabolic Panel: Recent Labs  Lab 07/31/21 1150 07/31/21 1729 08/01/21 0503 08/02/21 0519 08/03/21 0544 08/04/21 0238  NA 134*  --  138 135 136 138  K 3.4*  --  3.7 4.4 3.8 2.6*  CL 101  --  107 106 107 102  CO2 26  --  26 23 22 30   GLUCOSE 140*  --  89 163* 135* 122*  BUN 16  --  13 13 11 14   CREATININE 0.89  --  0.89 0.63 0.62 0.75  CALCIUM 9.2  --  8.5* 8.6* 9.1 8.3*  MG  --  1.7  --   --  2.1 1.9  PHOS  --   --   --   --  3.1 3.3    GFR: Estimated Creatinine Clearance: 56 mL/min (by C-G formula based on SCr of 0.75 mg/dL).  Liver Function Tests: Recent Labs  Lab 07/31/21 1150 08/03/21 0544 08/04/21 0238  AST 31 50* 30  ALT 24 50* 39  ALKPHOS 76 89 78  BILITOT 0.7 0.5 0.4  PROT 7.6 6.7 6.4*  ALBUMIN 3.7 3.1* 2.9*    CBG: No results for input(s): GLUCAP in the last 168 hours.   Recent Results (from the past 240 hour(s))  Respiratory (~20 pathogens) panel by PCR     Status: None   Collection Time: 07/31/21 11:07 AM   Specimen: Nasopharyngeal Swab; Respiratory  Result Value Ref Range Status   Adenovirus NOT DETECTED NOT DETECTED Final   Coronavirus 229E NOT DETECTED NOT DETECTED Final    Comment: (NOTE) The Coronavirus on the Respiratory Panel, DOES NOT test for the novel  Coronavirus (2019 nCoV)    Coronavirus HKU1 NOT DETECTED NOT DETECTED Final   Coronavirus NL63 NOT DETECTED NOT DETECTED Final   Coronavirus OC43 NOT DETECTED NOT DETECTED Final   Metapneumovirus NOT DETECTED NOT DETECTED Final   Rhinovirus / Enterovirus NOT DETECTED NOT DETECTED Final   Influenza Marvalene Barrett NOT DETECTED NOT DETECTED Final   Influenza B NOT DETECTED NOT DETECTED Final   Parainfluenza Virus 1 NOT DETECTED NOT DETECTED Final   Parainfluenza Virus 2 NOT DETECTED NOT DETECTED Final   Parainfluenza Virus 3 NOT DETECTED NOT DETECTED Final   Parainfluenza Virus 4 NOT DETECTED NOT DETECTED  Final   Respiratory Syncytial Virus NOT DETECTED NOT DETECTED Final   Bordetella pertussis NOT DETECTED NOT DETECTED Final   Bordetella Parapertussis NOT DETECTED NOT DETECTED Final   Chlamydophila pneumoniae NOT DETECTED  NOT DETECTED Final   Mycoplasma pneumoniae NOT DETECTED NOT DETECTED Final    Comment: Performed at [facility], [place] [address], [place], [place] [address]  Resp Panel by RT-PCR (Flu Leilany Digeronimo&B, Covid) Nasopharyngeal Swab     Status: None   Collection Time: 07/31/21  3:27 PM   Specimen: Nasopharyngeal Swab; Nasopharyngeal(NP) swabs in vial transport medium  Result Value Ref Range Status   SARS Coronavirus 2 by RT PCR NEGATIVE NEGATIVE Final    Comment: (NOTE) SARS-CoV-2 target nucleic acids are NOT DETECTED.  The SARS-CoV-2 RNA is generally detectable in upper respiratory specimens during the acute phase of infection. The lowest concentration of SARS-CoV-2 viral copies this assay can detect is 138 copies/mL. Traycen Goyer negative result does not preclude SARS-Cov-2 infection and should not be used as the sole basis for treatment or other patient management decisions. Xzavien Harada negative result may occur with  improper specimen collection/handling, submission of specimen other than nasopharyngeal swab, presence of viral mutation(s) within the areas targeted by this assay, and inadequate number of viral copies(<138 copies/mL). Shiven Junious negative result must be combined with clinical observations, patient history, and epidemiological information. The expected result is Negative.  Fact Sheet for Patients:  EntrepreneurPulse.com.au  Fact Sheet for Healthcare Providers:  IncredibleEmployment.be  This test is no t yet approved or cleared by the [place] FDA and  has been authorized for detection and/or diagnosis of SARS-CoV-2 by FDA under an Emergency Use Authorization (EUA). This EUA will remain  in effect (meaning this test can be used) for the duration  of the COVID-19 declaration under Section 564(b)(1) of the Act, 21 U.S.C.section 360bbb-3(b)(1), unless the authorization is terminated  or revoked sooner.       Influenza Teckla Christiansen by PCR NEGATIVE NEGATIVE Final   Influenza B by PCR NEGATIVE NEGATIVE Final    Comment: (NOTE) The Xpert Xpress SARS-CoV-2/FLU/RSV plus assay is intended as an aid in the diagnosis of influenza from Nasopharyngeal swab specimens and should not be used as Dezaree Tracey sole basis for treatment. Nasal washings and aspirates are unacceptable for Xpert Xpress SARS-CoV-2/FLU/RSV testing.  Fact Sheet for Patients: EntrepreneurPulse.com.au  Fact Sheet for Healthcare Providers: IncredibleEmployment.be  This test is not yet approved or cleared by the [place] FDA and has been authorized for detection and/or diagnosis of SARS-CoV-2 by FDA under an Emergency Use Authorization (EUA). This EUA will remain in effect (meaning this test can be used) for the duration of the COVID-19 declaration under Section 564(b)(1) of the Act, 21 U.S.C. section 360bbb-3(b)(1), unless the authorization is terminated or revoked.  Performed at [facility], [place] [address], [place], [place] [address]   Blood culture (routine x 2)     Status: None (Preliminary result)   Collection Time: 07/31/21  5:27 PM   Specimen: BLOOD LEFT FOREARM  Result Value Ref Range Status   Specimen Description   Final    BLOOD LEFT FOREARM Performed at [facility] [address] [place], [place] [address]    Special Requests   Final    BOTTLES DRAWN AEROBIC AND ANAEROBIC Blood Culture results may not be optimal due to an inadequate volume of blood received in culture bottles Performed at [facility] [address] [place], [place] [address]    Culture   Final    NO GROWTH 3 DAYS Performed at [facility], [place] [address], [place], [place] [address]    Report  Status PENDING  Incomplete  Blood  culture (routine x 2)     Status: None (Preliminary result)   Collection Time: 07/31/21  7:52 PM   Specimen: BLOOD LEFT WRIST  Result Value Ref Range Status   Specimen Description   Final    BLOOD LEFT WRIST Performed at [facility], [address] [place], [place] [address]    Special Requests   Final    Blood Culture results may not be optimal due to an inadequate volume of blood received in culture bottles BOTTLES DRAWN AEROBIC ONLY Performed at [facility], [place] [address] [place], [place] [address]    Culture   Final    NO GROWTH 3 DAYS Performed at [facility], [place] [address] [place], [place] [address]    Report Status PENDING  Incomplete  MRSA Next Gen by PCR, Nasal     Status: None   Collection Time: 08/03/21  9:58 AM   Specimen: Nasal Mucosa; Nasal Swab  Result Value Ref Range Status   MRSA by PCR Next Gen NOT DETECTED NOT DETECTED Final    Comment: (NOTE) The GeneXpert MRSA Assay (FDA approved for NASAL specimens only), is one component of Jamaal Bernasconi comprehensive MRSA colonization surveillance program. It is not intended to diagnose MRSA infection nor to guide or monitor treatment for MRSA infections. Test performance is not FDA approved in patients less than 22 years old. Performed at [facility], [place] [address], [place], [place] [address]          Radiology Studies: DG Chest Port 1 View  Result Date: 08/03/2021 CLINICAL DATA:  Acute respiratory distress EXAM: PORTABLE CHEST 1 VIEW COMPARISON:  08/02/2021 FINDINGS: Cardiac and mediastinal silhouette within normal limits. Postsurgical changes in the right upper lung, with surgical clips along the right chest wall. Unchanged lingular airspace disease. Blunting of the right costophrenic angle, possibly Lazar Tierce trace pleural effusion. No acute osseous abnormality. IMPRESSION: Unchanged lingular consolidation. Possible trace right pleural  effusion. Electronically Signed   By: Merilyn Baba M.D.   On: 08/03/2021 00:54   DG CHEST PORT 1 VIEW  Result Date: 08/02/2021 CLINICAL DATA:  CHF. EXAM: PORTABLE CHEST 1 VIEW COMPARISON:  Chest x-ray and chest CT 07/31/2021. FINDINGS: Cardiomediastinal silhouette is within normal limits. Postsurgical changes are again seen in the right upper lung. Surgical clips are noted in the right chest wall, unchanged. Lingular airspace disease appears unchanged from the prior examination. There is no pleural effusion or pneumothorax. No acute fractures are seen. IMPRESSION: 1. Unchanged lingular consolidation. Electronically Signed   By: Ronney Asters M.D.   On: 08/02/2021 15:43   ECHOCARDIOGRAM COMPLETE  Result Date: 08/02/2021    ECHOCARDIOGRAM REPORT   Patient Name:   [name] Date of Exam: 08/02/2021 Medical Rec #:  [id number]     Height:       66.0 in Accession #:    [id number]    Weight:       141.1 lb Date of Birth:  08/02/1946     BSA:          1.724 m Patient Age:    9 years      BP:           141/72 mmHg Patient Gender: F             HR:           80 bpm. Exam Location:  Inpatient Procedure: 2D Echo, Cardiac Doppler and Color Doppler Indications:  Dyspnea  History:        Patient has prior history of Echocardiogram examinations, most                 recent 05/21/2020. CHF, CAD, PVD, Signs/Symptoms:Dyspnea and                 Pneumonia; Risk Factors:Hypertension, Dyslipidemia and Sleep                 Apnea. Resp. failure.  Sonographer:    Dustin Flock RDCS Referring Phys: [id number] Jyquan Kenley CALDWELL POWELL JR  Sonographer Comments: Technically challenging study due to limited acoustic windows. Image acquisition challenging due to respiratory motion. Patient has pneumonia IMPRESSIONS  1. Left ventricular ejection fraction, by estimation, is 55 to 60%. The left ventricle has normal function. The left ventricle has no regional wall motion abnormalities. Left ventricular diastolic parameters are consistent  with Grade II diastolic dysfunction (pseudonormalization).  2. Right ventricular systolic function is normal. The right ventricular size is normal. Tricuspid regurgitation signal is inadequate for assessing PA pressure.  3. The mitral valve is normal in structure. No evidence of mitral valve regurgitation. No evidence of mitral stenosis.  4. The aortic valve is normal in structure. Aortic valve regurgitation is not visualized. No aortic stenosis is present.  5. The inferior vena cava is dilated in size with >50% respiratory variability, suggesting right atrial pressure of 8 mmHg. Comparison(s): Compared to prior echo in 05/21/2020, there has been improvement in the Left ventricular ejection fraction was 45-50% now 55-60%. FINDINGS  Left Ventricle: Left ventricular ejection fraction, by estimation, is 55 to 60%. The left ventricle has normal function. The left ventricle has no regional wall motion abnormalities. The left ventricular internal cavity size was normal in size. There is  no left ventricular hypertrophy. Left ventricular diastolic parameters are consistent with Grade II diastolic dysfunction (pseudonormalization). Right Ventricle: The right ventricular size is normal. No increase in right ventricular wall thickness. Right ventricular systolic function is normal. Tricuspid regurgitation signal is inadequate for assessing PA pressure. Left Atrium: Left atrial size was normal in size. Right Atrium: Right atrial size was normal in size. Pericardium: There is no evidence of pericardial effusion. Mitral Valve: The mitral valve is normal in structure. Mild to moderate mitral annular calcification. No evidence of mitral valve regurgitation. No evidence of mitral valve stenosis. Tricuspid Valve: The tricuspid valve is normal in structure. Tricuspid valve regurgitation is not demonstrated. No evidence of tricuspid stenosis. Aortic Valve: The aortic valve is normal in structure. Aortic valve regurgitation is not  visualized. No aortic stenosis is present. Pulmonic Valve: The pulmonic valve was normal in structure. Pulmonic valve regurgitation is not visualized. No evidence of pulmonic stenosis. Aorta: The aortic root is normal in size and structure. Venous: The inferior vena cava is dilated in size with greater than 50% respiratory variability, suggesting right atrial pressure of 8 mmHg. IAS/Shunts: No atrial level shunt detected by color flow Doppler.  LEFT VENTRICLE PLAX 2D LVIDd:         4.40 cm   Diastology LVIDs:         2.40 cm   LV e' medial:    4.35 cm/s LV PW:         1.00 cm   LV E/e' medial:  21.7 LV IVS:        1.00 cm   LV e' lateral:   7.18 cm/s LVOT diam:     2.10 cm   LV E/e' lateral:  13.1 LV SV:         65 LV SV Index:   38 LVOT Area:     3.46 cm  RIGHT VENTRICLE RV Basal diam:  3.00 cm RV S prime:     9.46 cm/s TAPSE (M-mode): 1.8 cm LEFT ATRIUM             Index        RIGHT ATRIUM           Index LA diam:        4.60 cm 2.67 cm/m   RA Area:     11.80 cm LA Vol (A2C):   52.3 ml 30.33 ml/m  RA Volume:   26.20 ml  15.20 ml/m LA Vol (A4C):   24.8 ml 14.38 ml/m LA Biplane Vol: 37.7 ml 21.87 ml/m  AORTIC VALVE LVOT Vmax:   87.90 cm/s LVOT Vmean:  55.100 cm/s LVOT VTI:    0.188 m  AORTA Ao Root diam: 2.90 cm MITRAL VALVE MV Area (PHT): 4.93 cm    SHUNTS MV Decel Time: 154 msec    Systemic VTI:  0.19 m MV E velocity: 94.30 cm/s  Systemic Diam: 2.10 cm MV Sulamita Lafountain velocity: 85.30 cm/s MV E/Catarina Huntley ratio:  1.11 Kardie Tobb DO Electronically signed by Berniece Salines DO Signature Date/Time: 08/02/2021/6:11:22 PM    Final         Scheduled Meds:  aspirin EC  81 mg Oral Daily   atorvastatin  80 mg Oral Daily   budesonide  0.5 mg Nebulization BID   chlorhexidine  15 mL Mouth Rinse BID   Chlorhexidine Gluconate Cloth  6 each Topical Daily   cilostazol  100 mg Oral BID   clopidogrel  75 mg Oral Daily   enoxaparin (LOVENOX) injection  40 mg Subcutaneous Q24H   ferrous sulfate  325 mg Oral Q breakfast   fluticasone   1 spray Each Nare BID   fluticasone furoate-vilanterol  1 puff Inhalation Daily   And   umeclidinium bromide  1 puff Inhalation Daily   furosemide  40 mg Intravenous BID   guaiFENesin  600 mg Oral Daily   hydrocortisone sod succinate (SOLU-CORTEF) inj  100 mg Intravenous Q8H   hydrOXYzine  25 mg Oral QHS   ipratropium-albuterol  3 mL Nebulization Q6H WA   levothyroxine  112 mcg Oral QAC breakfast   LORazepam  1 mg Oral QHS   mouth rinse  15 mL Mouth Rinse q12n4p   montelukast  10 mg Oral QHS   pantoprazole  80 mg Oral Q1200   PARoxetine  20 mg Oral QHS   potassium chloride  40 mEq Oral BID   sodium chloride flush  3 mL Intravenous Q12H   tamsulosin  0.4 mg Oral QHS   traZODone  100 mg Oral QHS   vitamin B-12  1,000 mcg Oral Daily   Continuous Infusions:  sodium chloride     azithromycin Stopped (08/03/21 1948)   cefTRIAXone (ROCEPHIN)  IV Stopped (08/03/21 1515)   potassium chloride       LOS: 3 days    Time spent: over 30 min    Fayrene Helper, MD [facility]   To contact the attending provider between 7A-7P or the covering provider during after hours 7P-7A, please log into the web site [email or web address] and access using universal [place] password for that web site. If you do not have the password, please call the hospital operator.  08/04/2021, 8:09 AM

## 2021-08-05 LAB — CULTURE, BLOOD (ROUTINE X 2)
Culture: NO GROWTH
Culture: NO GROWTH

## 2021-08-05 LAB — CBC WITH DIFFERENTIAL/PLATELET
Abs Immature Granulocytes: 0.06 10*3/uL (ref 0.00–0.07)
Basophils Absolute: 0 10*3/uL (ref 0.0–0.1)
Basophils Relative: 0 %
Eosinophils Absolute: 0 10*3/uL (ref 0.0–0.5)
Eosinophils Relative: 0 %
HCT: 31.6 % — ABNORMAL LOW (ref 36.0–46.0)
Hemoglobin: 10.6 g/dL — ABNORMAL LOW (ref 12.0–15.0)
Immature Granulocytes: 1 %
Lymphocytes Relative: 10 %
Lymphs Abs: 0.5 10*3/uL — ABNORMAL LOW (ref 0.7–4.0)
MCH: 26.6 pg (ref 26.0–34.0)
MCHC: 33.5 g/dL (ref 30.0–36.0)
MCV: 79.2 fL — ABNORMAL LOW (ref 80.0–100.0)
Monocytes Absolute: 0.4 10*3/uL (ref 0.1–1.0)
Monocytes Relative: 8 %
Neutro Abs: 4.4 10*3/uL (ref 1.7–7.7)
Neutrophils Relative %: 81 %
Platelets: 466 10*3/uL — ABNORMAL HIGH (ref 150–400)
RBC: 3.99 MIL/uL (ref 3.87–5.11)
RDW: 15.6 % — ABNORMAL HIGH (ref 11.5–15.5)
WBC: 5.5 10*3/uL (ref 4.0–10.5)
nRBC: 0 % (ref 0.0–0.2)

## 2021-08-05 LAB — PHOSPHORUS: Phosphorus: 3.1 mg/dL (ref 2.5–4.6)

## 2021-08-05 LAB — COMPREHENSIVE METABOLIC PANEL
ALT: 33 U/L (ref 0–44)
AST: 21 U/L (ref 15–41)
Albumin: 3.1 g/dL — ABNORMAL LOW (ref 3.5–5.0)
Alkaline Phosphatase: 73 U/L (ref 38–126)
Anion gap: 7 (ref 5–15)
BUN: 12 mg/dL (ref 8–23)
CO2: 33 mmol/L — ABNORMAL HIGH (ref 22–32)
Calcium: 8.6 mg/dL — ABNORMAL LOW (ref 8.9–10.3)
Chloride: 98 mmol/L (ref 98–111)
Creatinine, Ser: 0.63 mg/dL (ref 0.44–1.00)
GFR, Estimated: >60 mL/min (ref >60)
Glucose, Bld: 133 mg/dL — ABNORMAL HIGH (ref 70–99)
Potassium: 3.2 mmol/L — ABNORMAL LOW (ref 3.5–5.1)
Sodium: 138 mmol/L (ref 135–145)
Total Bilirubin: 0.4 mg/dL (ref 0.3–1.2)
Total Protein: 6.5 g/dL (ref 6.5–8.1)

## 2021-08-05 LAB — MAGNESIUM: Magnesium: 2 mg/dL (ref 1.7–2.4)

## 2021-08-05 MED ORDER — CLOTRIMAZOLE 10 MG MT TROC
10.0000 mg | Freq: Every day | OROMUCOSAL | Status: DC
  Administered 2021-08-05 – 2021-08-07 (×9): 10 mg via ORAL
  Filled 2021-08-05 (×11): qty 1

## 2021-08-05 MED ORDER — PREDNISONE 5 MG PO TABS: 10.0000 mg | ORAL_TABLET | Freq: Every day | ORAL | Status: DC

## 2021-08-05 MED ORDER — PREDNISONE 20 MG PO TABS
40.0000 mg | ORAL_TABLET | Freq: Every day | ORAL | Status: DC
  Administered 2021-08-08: 40 mg via ORAL
  Filled 2021-08-05: qty 2

## 2021-08-05 MED ORDER — PREDNISONE 20 MG PO TABS
60.0000 mg | ORAL_TABLET | Freq: Every day | ORAL | Status: AC
  Administered 2021-08-06 – 2021-08-07 (×2): 60 mg via ORAL
  Filled 2021-08-05 (×2): qty 3

## 2021-08-05 MED ORDER — LOPERAMIDE HCL 2 MG PO CAPS
4.0000 mg | ORAL_CAPSULE | Freq: Once | ORAL | Status: AC
  Administered 2021-08-05: 4 mg via ORAL
  Filled 2021-08-05: qty 2

## 2021-08-05 MED ORDER — PREDNISONE 20 MG PO TABS: 20.0000 mg | ORAL_TABLET | Freq: Every day | ORAL | Status: DC

## 2021-08-05 MED ORDER — PREDNISONE 5 MG PO TABS: 30.0000 mg | ORAL_TABLET | Freq: Every day | ORAL | Status: DC

## 2021-08-05 MED ORDER — HYDROCORTISONE SOD SUC (PF) 100 MG IJ SOLR: 100.0000 mg | Freq: Two times a day (BID) | INTRAMUSCULAR | Status: DC

## 2021-08-05 MED ORDER — POTASSIUM CHLORIDE CRYS ER 20 MEQ PO TBCR
40.0000 meq | EXTENDED_RELEASE_TABLET | ORAL | Status: AC
  Administered 2021-08-05 (×2): 40 meq via ORAL
  Filled 2021-08-05 (×2): qty 2

## 2021-08-05 MED ORDER — POTASSIUM CHLORIDE CRYS ER 20 MEQ PO TBCR
20.0000 meq | EXTENDED_RELEASE_TABLET | Freq: Once | ORAL | Status: AC
  Administered 2021-08-05: 20 meq via ORAL
  Filled 2021-08-05: qty 1

## 2021-08-05 MED ORDER — GUAIFENESIN-DM 100-10 MG/5ML PO SYRP
5.0000 mL | ORAL_SOLUTION | ORAL | Status: DC | PRN
  Administered 2021-08-05 – 2021-08-07 (×5): 5 mL via ORAL
  Filled 2021-08-05 (×5): qty 10

## 2021-08-05 NOTE — Progress Notes (Signed)
PROGRESS NOTE    [name]  GYI:[id number] DOB: October 23, 1945 DOA: 07/31/2021 PCP: [place], [name], MD   Chief Complaint  Patient presents with   Flank Pain   Back Pain   Brief Narrative:  75 y.o. female with medical history significant of systolic CHF, hypertension, hypothyroidism, hyperlipidemia, PVD, GERD, PAD, CAD, OSA, chronic respiratory failure on 2L oxygen [place] prn, chronic mesenteric ischemia and breast cancer who presented with left thoracic pain under her left breast that started about 2 days prior to admission.  Pain was described as [name] sharp stabbing pain.  In the emergency department she underwent work-up including chest x-ray, CT angiogram chest along with CT abdomen and pelvis.  No PE was noted.  Pneumonia was noted.  Patient was hospitalized for further management.  Currently being treated for CAP and COPD exacerbation.  She's now developed heart failure, transfer to stepdown for bipap.  Will follow for need for pulm involvement and evaluation.  Assessment & Plan:   Principal Problem:   Sepsis due to pneumonia [facility]) Active Problems:   Hypothyroidism   Hyperlipidemia   Essential hypertension   COPD, frequent exacerbations ([place])   GERD   PAD (peripheral artery disease) (HCC)   Tobacco abuse   CAD (coronary artery disease)   Chronic respiratory failure with hypoxia (HCC)   OSA (obstructive sleep apnea)   Hypokalemia   Prediabetes   Systolic CHF, chronic ([place])   CAP (community acquired pneumonia)  Acute Hypoxic Respiratory Failure Thought 2/2 HF and COPD exacerbation as well as pneumonia Improved today, transfer to floor, continue nightly bipap  Acute on Chronic systolic and diastolic diastolic CHF  Volume Overload Last echocardiogram from 2021 showed mildly depressed EF of 45 to 50%.  Repeat echo with EF 35-00%, grade 2 diastolic dysfunction Lasix initially held due to hypotension, now resolved -> now appears she's overloaded Weight improved, follow  Net  negative Continue IV lasix, follow, she's improving  COPD Exacerbation  History of COPD, Gold stage III/chronic respiratory failure with hypoxia/tobacco abuse Uses 2 L of oxygen at home but mainly at nighttime.   Continuing to wheeze and have coarse cough Requiring 4 L, wean as tolerated On abx as noted below Getting solucortef given concern for adrenal insufficiency below (lower suspicion) -> will start to taper Continue scheduled and prn nebs.    Community-acquired pneumonia  Sepsis Patient was noted to have leukocytosis, tachycardia, tachypnea.  Noted to have elevated lactic acid level at 3.0.  Follow-up culture data - pending blood cultures - NGTDx2 days Follow sputum cx pending, MRSA PCR negative Urine strep negative, urine legionella negative RVP negative.  Negative influenza, covid. Continue ceftriaxone (10/17-present), azithromycin (completed 5 days CXR 10/20 with lingular consolidation, trace R effusion WBC count improved, don't think we need to broaden abx, worsening suspected due to HF and COPD   Hypotension/possible adrenal insufficiency Suspect 2/2 above BP improved Taper steroids, low suspicion for adrenal insuffiency based on history of steroids  Lasix resumed as above now with HF exacerbation Echo with hypotension as noted below Most recent BP improved, follow closely  History of coronary artery disease/peripheral artery disease/chronic mesenteric ischemia Underwent heart cath in 2021 which did not show any significant obstructive CAD. She had history of left subclavian stenting in the past.  Mesenteric disease is being treated medically.   She is on aspirin and Plavix, Pletal as well as statin treatment.   Normocytic anemia Drop in hemoglobin appears to be dilutional.  No evidence of overt blood  loss noted.  Baseline hemoglobin seems to be around 10-12. Low normal B12, follow MMA - supplement Iron def noted as well - supplement   Hypokalemia Replace PO/Oral,  follow Mag ok  Obstructive sleep apnea Is supposed to be on CPAP at night but does not use it like she should.  Hypothyroidism Continue levothyroxine.  History of GERD Continue PPI   History of anxiety/depression/insomnia Continue home medications.  DVT prophylaxis: lovenox Code Status: DNR Family Communication: none at bedside - husband at bedside 10/20 Disposition:   Status is: Inpatient  Remains inpatient appropriate because: need for IV abx     Consultants:  none  Procedures:  Echo IMPRESSIONS     1. Left ventricular ejection fraction, by estimation, is 55 to 60%. The  left ventricle has normal function. The left ventricle has no regional  wall motion abnormalities. Left ventricular diastolic parameters are  consistent with Grade II diastolic  dysfunction (pseudonormalization).   2. Right ventricular systolic function is normal. The right ventricular  size is normal. Tricuspid regurgitation signal is inadequate for assessing  PA pressure.   3. The mitral valve is normal in structure. No evidence of mitral valve  regurgitation. No evidence of mitral stenosis.   4. The aortic valve is normal in structure. Aortic valve regurgitation is  not visualized. No aortic stenosis is present.   5. The inferior vena cava is dilated in size with >50% respiratory  variability, suggesting right atrial pressure of 8 mmHg.   Comparison(s): Compared to prior echo in 05/21/2020, there has been  improvement in the Left ventricular ejection fraction was 45-50% now  55-60%.   Antimicrobials:  Anti-infectives (From admission, onward)    Start     Dose/Rate Route Frequency Ordered Stop   08/01/21 1800  azithromycin (ZITHROMAX) 500 mg in sodium chloride 0.9 % 250 mL IVPB        500 mg 250 mL/hr over 60 Minutes Intravenous Every 24 hours 07/31/21 1709 08/06/21 1759   08/01/21 1500  cefTRIAXone (ROCEPHIN) 1 g in sodium chloride 0.9 % 100 mL IVPB        1 g 200 mL/hr over 30 Minutes  Intravenous Every 24 hours 07/31/21 1708     07/31/21 1500  cefTRIAXone (ROCEPHIN) 1 g in sodium chloride 0.9 % 100 mL IVPB        1 g 200 mL/hr over 30 Minutes Intravenous  Once 07/31/21 1454 07/31/21 1552   07/31/21 1500  azithromycin (ZITHROMAX) 500 mg in sodium chloride 0.9 % 250 mL IVPB        500 mg 250 mL/hr over 60 Minutes Intravenous  Once 07/31/21 1454 07/31/21 1850          Subjective: Feeling progressively better  Objective: Vitals:   08/05/21 0755 08/05/21 0801 08/05/21 0803 08/05/21 0804  BP:      Pulse:      Resp:      Temp: 98.5 F (36.9 C)     TempSrc: Oral     SpO2:  95% 94% 96%  Weight:      Height:        Intake/Output Summary (Last 24 hours) at 08/05/2021 0829 Last data filed at 08/05/2021 0500 Gross per 24 hour  Intake 342.53 ml  Output 3600 ml  Net -3257.47 ml   Filed Weights   08/03/21 1000 08/04/21 0500 08/05/21 0500  Weight: 64 kg 61.1 kg 60.4 kg    Examination:  General: No acute distress. Cardiovascular: Heart sounds show Charmagne Buhl regular  rate, and rhythm. Lungs:coarse cough, wheeze with deep breaths, at rest appears comfortable on 4 L. Abdomen: Soft, nontender, nondistended  Neurological: Alert and oriented 3. Moves all extremities 4 . Cranial nerves II through XII grossly intact. Skin: Warm and dry. No rashes or lesions. Extremities: No clubbing or cyanosis. No edema.    Data Reviewed: I have personally reviewed following labs and imaging studies  CBC: Recent Labs  Lab 07/31/21 1150 08/01/21 0503 08/02/21 0519 08/03/21 0544 08/04/21 0238 08/05/21 0251  WBC 26.3* 14.5* 8.3 8.0 6.5 5.5  NEUTROABS 23.5*  --   --  6.9 5.6 4.4  HGB 11.5* 9.7* 9.8* 10.1* 10.3* 10.6*  HCT 34.6* 29.8* 31.3* 30.5* 31.8* 31.6*  MCV 80.8 82.5 84.4 81.8 81.1 79.2*  PLT 435* 338 382 446* 462* 466*    Basic Metabolic Panel: Recent Labs  Lab 07/31/21 1729 08/01/21 0503 08/02/21 0519 08/03/21 0544 08/04/21 0238 08/04/21 1441 08/05/21 0251  NA   --    < > 135 136 138 136 138  K  --    < > 4.4 3.8 2.6* 3.7 3.2*  CL  --    < > 106 107 102 97* 98  CO2  --    < > 23 22 30 28  33*  GLUCOSE  --    < > 163* 135* 122* 185* 133*  BUN  --    < > 13 11 14 20 12   CREATININE  --    < > 0.63 0.62 0.75 0.72 0.63  CALCIUM  --    < > 8.6* 9.1 8.3* 8.6* 8.6*  MG 1.7  --   --  2.1 1.9  --  2.0  PHOS  --   --   --  3.1 3.3  --  3.1   < > = values in this interval not displayed.    GFR: Estimated Creatinine Clearance: 56 mL/min (by C-G formula based on SCr of 0.63 mg/dL).  Liver Function Tests: Recent Labs  Lab 07/31/21 1150 08/03/21 0544 08/04/21 0238 08/05/21 0251  AST 31 50* 30 21  ALT 24 50* 39 33  ALKPHOS 76 89 78 73  BILITOT 0.7 0.5 0.4 0.4  PROT 7.6 6.7 6.4* 6.5  ALBUMIN 3.7 3.1* 2.9* 3.1*    CBG: No results for input(s): GLUCAP in the last 168 hours.   Recent Results (from the past 240 hour(s))  Respiratory (~20 pathogens) panel by PCR     Status: None   Collection Time: 07/31/21 11:07 AM   Specimen: Nasopharyngeal Swab; Respiratory  Result Value Ref Range Status   Adenovirus NOT DETECTED NOT DETECTED Final   Coronavirus 229E NOT DETECTED NOT DETECTED Final    Comment: (NOTE) The Coronavirus on the Respiratory Panel, DOES NOT test for the novel  Coronavirus (2019 nCoV)    Coronavirus HKU1 NOT DETECTED NOT DETECTED Final   Coronavirus NL63 NOT DETECTED NOT DETECTED Final   Coronavirus OC43 NOT DETECTED NOT DETECTED Final   Metapneumovirus NOT DETECTED NOT DETECTED Final   Rhinovirus / Enterovirus NOT DETECTED NOT DETECTED Final   Influenza Suesan Mohrmann NOT DETECTED NOT DETECTED Final   Influenza B NOT DETECTED NOT DETECTED Final   Parainfluenza Virus 1 NOT DETECTED NOT DETECTED Final   Parainfluenza Virus 2 NOT DETECTED NOT DETECTED Final   Parainfluenza Virus 3 NOT DETECTED NOT DETECTED Final   Parainfluenza Virus 4 NOT DETECTED NOT DETECTED Final   Respiratory Syncytial Virus NOT DETECTED NOT DETECTED Final   Bordetella  pertussis NOT  DETECTED NOT DETECTED Final   Bordetella Parapertussis NOT DETECTED NOT DETECTED Final   Chlamydophila pneumoniae NOT DETECTED NOT DETECTED Final   Mycoplasma pneumoniae NOT DETECTED NOT DETECTED Final    Comment: Performed at [facility], [place] [address] [place], [place] [address]  Resp Panel by RT-PCR (Flu Dorothyann Mourer&B, Covid) Nasopharyngeal Swab     Status: None   Collection Time: 07/31/21  3:27 PM   Specimen: Nasopharyngeal Swab; Nasopharyngeal(NP) swabs in vial transport medium  Result Value Ref Range Status   SARS Coronavirus 2 by RT PCR NEGATIVE NEGATIVE Final    Comment: (NOTE) SARS-CoV-2 target nucleic acids are NOT DETECTED.  The SARS-CoV-2 RNA is generally detectable in upper respiratory specimens during the acute phase of infection. The lowest concentration of SARS-CoV-2 viral copies this assay can detect is 138 copies/mL. Layla Kesling negative result does not preclude SARS-Cov-2 infection and should not be used as the sole basis for treatment or other patient management decisions. Kendrew Paci negative result may occur with  improper specimen collection/handling, submission of specimen other than nasopharyngeal swab, presence of viral mutation(s) within the areas targeted by this assay, and inadequate number of viral copies(<138 copies/mL). Cally Nygard negative result must be combined with clinical observations, patient history, and epidemiological information. The expected result is Negative.  Fact Sheet for Patients:  EntrepreneurPulse.com.au  Fact Sheet for Healthcare Providers:  IncredibleEmployment.be  This test is no t yet approved or cleared by the [place] FDA and  has been authorized for detection and/or diagnosis of SARS-CoV-2 by FDA under an Emergency Use Authorization (EUA). This EUA will remain  in effect (meaning this test can be used) for the duration of the COVID-19 declaration under Section 564(b)(1) of the Act, 21 U.S.C.section  360bbb-3(b)(1), unless the authorization is terminated  or revoked sooner.       Influenza Caryn Gienger by PCR NEGATIVE NEGATIVE Final   Influenza B by PCR NEGATIVE NEGATIVE Final    Comment: (NOTE) The Xpert Xpress SARS-CoV-2/FLU/RSV plus assay is intended as an aid in the diagnosis of influenza from Nasopharyngeal swab specimens and should not be used as Klaus Casteneda sole basis for treatment. Nasal washings and aspirates are unacceptable for Xpert Xpress SARS-CoV-2/FLU/RSV testing.  Fact Sheet for Patients: EntrepreneurPulse.com.au  Fact Sheet for Healthcare Providers: IncredibleEmployment.be  This test is not yet approved or cleared by the [place] FDA and has been authorized for detection and/or diagnosis of SARS-CoV-2 by FDA under an Emergency Use Authorization (EUA). This EUA will remain in effect (meaning this test can be used) for the duration of the COVID-19 declaration under Section 564(b)(1) of the Act, 21 U.S.C. section 360bbb-3(b)(1), unless the authorization is terminated or revoked.  Performed at [facility], [place] [address] [place], [place] [address]   Blood culture (routine x 2)     Status: None (Preliminary result)   Collection Time: 07/31/21  5:27 PM   Specimen: BLOOD LEFT FOREARM  Result Value Ref Range Status   Specimen Description   Final    BLOOD LEFT FOREARM Performed at [place] [address], [place], [place] [address]    Special Requests   Final    BOTTLES DRAWN AEROBIC AND ANAEROBIC Blood Culture results may not be optimal due to an inadequate volume of blood received in culture bottles Performed at [place] [address] [place],  [address]    Culture   Final    NO GROWTH 4 DAYS Performed at [facility], [address], [place], [place] [address]    Report Status PENDING  Incomplete  Blood culture (routine x 2)     Status: None  (Preliminary result)   Collection Time: 07/31/21  7:52 PM   Specimen: BLOOD LEFT WRIST  Result Value Ref Range Status   Specimen Description   Final    BLOOD LEFT WRIST Performed at [place] [address] [place], [place] [address]    Special Requests   Final    Blood Culture results may not be optimal due to an inadequate volume of blood received in culture bottles BOTTLES DRAWN AEROBIC ONLY Performed at [facility], [place] [address] [place], [place] [address]    Culture   Final    NO GROWTH 4 DAYS Performed at [facility], [place] [address] [place], [place] [address]    Report Status PENDING  Incomplete  MRSA Next Gen by PCR, Nasal     Status: None   Collection Time: 08/03/21  9:58 AM   Specimen: Nasal Mucosa; Nasal Swab  Result Value Ref Range Status   MRSA by PCR Next Gen NOT DETECTED NOT DETECTED Final    Comment: (NOTE) The GeneXpert MRSA Assay (FDA approved for NASAL specimens only), is one component of Rhema Boyett comprehensive MRSA colonization surveillance program. It is not intended to diagnose MRSA infection nor to guide or monitor treatment for MRSA infections. Test performance is not FDA approved in patients less than 35 years old. Performed at [place], [place] [address] [place], [place] [address]          Radiology Studies: No results found.      Scheduled Meds:  aspirin EC  81 mg Oral Daily   atorvastatin  80 mg Oral Daily   budesonide  0.5 mg Nebulization BID   chlorhexidine  15 mL Mouth Rinse BID   Chlorhexidine Gluconate Cloth  6 each Topical Daily   cilostazol  100 mg Oral BID   clopidogrel  75 mg Oral Daily   enoxaparin (LOVENOX) injection  40 mg Subcutaneous Q24H   ferrous sulfate  325 mg Oral Q breakfast   fluticasone  1 spray Each Nare BID   fluticasone furoate-vilanterol  1 puff Inhalation Daily   And   umeclidinium bromide  1 puff Inhalation Daily   furosemide  40 mg Intravenous BID    guaiFENesin  600 mg Oral Daily   hydrocortisone sod succinate (SOLU-CORTEF) inj  100 mg Intravenous Q8H   hydrOXYzine  25 mg Oral QHS   ipratropium-albuterol  3 mL Nebulization TID   levothyroxine  112 mcg Oral QAC breakfast   LORazepam  1 mg Oral QHS   mouth rinse  15 mL Mouth Rinse q12n4p   montelukast  10 mg Oral QHS   pantoprazole  80 mg Oral Q1200   PARoxetine  20 mg Oral QHS   potassium chloride  40 mEq Oral Q4H   sodium chloride flush  3 mL Intravenous Q12H   tamsulosin  0.4 mg Oral QHS   traZODone  100 mg Oral QHS   vitamin B-12  1,000 mcg Oral Daily   Continuous Infusions:  sodium chloride     azithromycin Stopped (08/04/21 1921)   cefTRIAXone (ROCEPHIN)  IV Stopped (08/04/21 1621)     LOS: 4 days    Time spent: over 30 min    Fayrene Helper, MD [facility]   To contact the attending provider between 7A-7P or the  covering provider during after hours 7P-7A, please log into the web site [email or web address] and access using universal [place] password for that web site. If you do not have the password, please call the hospital operator.  08/05/2021, 8:29 AM

## 2021-08-05 NOTE — Evaluation (Signed)
Clinical/Bedside Swallow Evaluation Patient Details  Name: [name] MRN: [id number] Date of Birth: 03-22-1946  Today's Date: 08/05/2021 Time: SLP Start Time (ACUTE ONLY): 4315 SLP Stop Time (ACUTE ONLY): 4008 SLP Time Calculation (min) (ACUTE ONLY): 15 min  Past Medical History:  Past Medical History:  Diagnosis Date   Bilateral leg cramps    Bladder neoplasm    Cancer (HCC)    CHF (congestive heart failure) (HCC)    Chronic deep vein thrombosis (DVT) of right lower extremity ([place])    05/ 2018  chronic non-occlusive DVT RLE   COPD, frequent exacerbations ([place])    03-06-2018 per pt last exacerbation 12/ 2018   Coronary artery disease    cardiologist-  dr Aundra Dubin-- 03-11-2018 er cath , 80% stenosis in the ostial second diagonal   Dyspnea on minimal exertion    Emphysema/COPD ([place])    CAT score- 17   Family history of breast cancer    Family history of colon cancer    Family history of melanoma    Family history of ovarian cancer    Family history of pancreatic cancer    Fibromyalgia 1995   GERD (gastroesophageal reflux disease)    Hiatal hernia    History of breast cancer    History of diverticulitis of colon    2002  s/p  sigmoid colectomy   History of multiple pulmonary nodules    hx RUL nodules x2  s/p right VATS w/ wedge resection 09-11-2005 and 06-14-2011  both  necrotizing granulomatous inflammation w/ cystic area of necrosis & focal calcification   History of right breast cancer oncologist-  dr Jana Hakim-- no recurrence   dx 2010--  IDC, Stage IA , ER/PR+,  (pT1c pN0) 11-09-2008 right lumpectomy;  12-18-2008 right simple mastectomy for DCIS margins;  completed chemotherapy 2010 (no radiation) and completed antiestrogen therapy   Hx of colonic polyps    Dr. 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   Nocturia    OA (osteoarthritis)    thumbs   On  supplemental oxygen therapy    03-06-2018 per pt uses only at night,  checks O2 sats at home,  stated am sat 89% after moving around average 93-94% with RA   Osteoporosis    Peripheral arterial occlusive disease ([place]) vascular-- dr chen/ dr Donzetta Matters   proximal right SFA severe focal stenosis with collaterals from the PFA/  04/ 2012 occluded celiac and SMA arteries with distal reconstitution w/ patent IMA   Peripheral vascular disease ([place])    chronic DVT RLE,  mesenteric vascular disease   Right lower lobe pulmonary nodule    Chest CT 08-29-2017   Stage 3 severe COPD by GOLD classification ([place]) hx frequent exacerbations--   pulmologist-  dr Maryann Alar--  per lov note , dated 12-20-2017, oxyogen 2L is prescribed for use with exertion (but pt only uses mostly at night), O2 sats on RA run the 80s , this day sat 86% RA and with 2L O2 sat 92%   Varicose veins of leg with swelling    varicose vein surgery - Dr. Aleda Grana   Wears glasses    Wears hearing aid in both ears    Past Surgical History:  Past Surgical History:  Procedure Laterality Date   ABDOMINAL AORTOGRAM N/A 07/11/2020   Procedure: ABDOMINAL AORTOGRAM;  Surgeon: Waynetta Sandy, MD;  Location: [facility]  INVASIVE CV LAB;  Service: Cardiovascular;  Laterality: N/A;   AORTIC ARCH ANGIOGRAPHY N/A 07/11/2020   Procedure: AORTIC ARCH ANGIOGRAPHY;  Surgeon: Waynetta Sandy, MD;  Location: [facility] CV LAB;  Service: Cardiovascular;  Laterality: N/A;   BREAST EXCISIONAL BIOPSY Left 10/15/2008   BREAST LUMPECTOMY W/ NEEDLE LOCALIZATION Right 11-09-2008    dr cornett   [place]  03-12-2011   dr Aundra Dubin   70-80% ostial stenosis in small second diagonal (appears to small for intervention),  dLAD 40-50%,  minimal luminal irregulartieis involoving LCFx and RCA,  LVEF 55%   CATARACT EXTRACTION W/ INTRAOCULAR LENS  IMPLANT, BILATERAL  2011   CYSTOSCOPY N/A 03/11/2018   Procedure: CYSTOSCOPY WITH INSTILLATION OF  POST OPERATIVE EPIRUBICIN;  Surgeon: Festus Aloe, MD;  Location: [facility];  Service: Urology;  Laterality: N/A;   LEFT HEART CATH AND CORONARY ANGIOGRAPHY N/A 05/25/2020   Procedure: LEFT HEART CATH AND CORONARY ANGIOGRAPHY;  Surgeon: Troy Sine, MD;  Location: [place] CV LAB;  Service: Cardiovascular;  Laterality: N/A;   MASTECTOMY Right    PARTIAL COLECTOMY  2002   sigmoid and appectomy (diverticulitis)   PARTIAL MASTECTOMY WITH NEEDLE LOCALIZATION Left 02/23/2013   Procedure:  LEFT PARTIAL MASTECTOMY WITH NEEDLE LOCALIZATION;  Surgeon: Adin Hector, MD;  Location: [facility];  Service: General;  Laterality: Left;   PERIPHERAL VASCULAR INTERVENTION  07/11/2020   Procedure: PERIPHERAL VASCULAR INTERVENTION;  Surgeon: Waynetta Sandy, MD;  Location: [place] CV LAB;  Service: Cardiovascular;;   RECONSTRUCTION BREAST W/ LATISSIMUS DORSI FLAP Right 05-30-2009   dr Harlow Mares  [facility]   REDUCTION MAMMAPLASTY Left    SIMPLE MASTECTOMY Right 12-28-2008    dr cornett  [facility]   TRANSTHORACIC ECHOCARDIOGRAM  07-09-2016  dr Aundra Dubin   ef 55-60%, grade 1 diastoic dysfunction/  mild TR   TRANSURETHRAL RESECTION OF BLADDER TUMOR N/A 03/11/2018   Procedure: TRANSURETHRAL RESECTION OF BLADDER TUMOR (TURBT) 2-5cm;  Surgeon: Festus Aloe, MD;  Location: [facility];  Service: Urology;  Laterality: N/A;   UPPER EXTREMITY ANGIOGRAPHY Left 07/11/2020   Procedure: Upper Extremity Angiography;  Surgeon: Waynetta Sandy, MD;  Location: [place] CV LAB;  Service: Cardiovascular;  Laterality: Left;   VAGINAL HYSTERECTOMY  1973   VIDEO ASSISTED THORACOSCOPY (VATS)/WEDGE RESECTION Right 09-11-2005  &  06-14-2011   dr Fara Boros  [facility]   both RUL   HPI:  Patient is a 75 y.o. female with PMH: CHF, hypertension, hypothyroidism, hyperlipidemia, PVD, GERD, PAD, CAD, OSA, chronic respiratory failure on 2L oxygen [place] prn, chronic mesenteric ischemia and breast cancer who presented with  left thoracic pain under her left breast that started about 2 days prior to admission. CXR showed unchanged lingular consolidation; Possible trace right pleural effusion. Esophagram 05/2020 showed Small to moderate-sized hiatal hernia. 2. Esophageal dysmotility, as described above.3. No gastroesophageal reflux, stricture or mass seen, afebrile, 6L ox, CPAP over night.    Assessment / Plan / Recommendation  Clinical Impression  Patient is not exhibiting s/s oral or pharyngeal phase dysphagia as per this swallow evaluation. Voice is strong, clear but hoarse (baseline per patient). Swallow initiation appeared to be timely and no coughing, throat clearing or other overt s/s of aspiration or penetration. Patient reports that her GERD has been bad for past couple years and she has had to have some changes in her PPI medications due to interactions with other medications. Patient also reported that one of her children (a son) passed  away September of last year and that she feels that this grief has led to decline in her health. No further skilled SLP intervention required at this time, but recommend continued treatment of her GERD. SLP Visit Diagnosis: Dysphagia, unspecified (R13.10)    Aspiration Risk  No limitations    Diet Recommendation Regular;Thin liquid   Liquid Administration via: Cup;Straw Medication Administration: Whole meds with liquid Supervision: Patient able to self feed Compensations: Slow rate;Small sips/bites Postural Changes: Seated upright at 90 degrees;Remain upright for at least 30 minutes after po intake    Other  Recommendations Oral Care Recommendations: Oral care BID    Recommendations for follow up therapy are one component of a multi-disciplinary discharge planning process, led by the attending physician.  Recommendations may be updated based on patient status, additional functional criteria and insurance authorization.  Follow up Recommendations None      Frequency and  Duration   N/A         Prognosis   N/A     Swallow Study   General Date of Onset: 08/05/21 HPI: Patient is a 75 y.o. female with PMH: CHF, hypertension, hypothyroidism, hyperlipidemia, PVD, GERD, PAD, CAD, OSA, chronic respiratory failure on 2L oxygen [place] prn, chronic mesenteric ischemia and breast cancer who presented with left thoracic pain under her left breast that started about 2 days prior to admission. CXR showed unchanged lingular consolidation; Possible trace right pleural effusion. Esophagram 05/2020 showed Small to moderate-sized hiatal hernia. 2. Esophageal dysmotility, as described above.3. No gastroesophageal reflux, stricture or mass seen, afebrile, 6L ox, CPAP over night. Type of Study: Bedside Swallow Evaluation Previous Swallow Assessment: none found Diet Prior to this Study: Thin liquids;Regular Temperature Spikes Noted: No Respiratory Status: Nasal cannula History of Recent Intubation: No Behavior/Cognition: Alert;Cooperative;Pleasant mood Oral Cavity Assessment: Within Functional Limits Oral Care Completed by SLP: No Oral Cavity - Dentition: Adequate natural dentition Vision: Functional for self-feeding Self-Feeding Abilities: Able to feed self Patient Positioning: Upright in bed Baseline Vocal Quality: Normal;Hoarse Volitional Cough: Strong Volitional Swallow: Able to elicit    Oral/Motor/Sensory Function Overall Oral Motor/Sensory Function: Within functional limits   Ice Chips     Thin Liquid Thin Liquid: Within functional limits Presentation: Straw;Self Fed    Nectar Thick     Honey Thick     Puree Puree: Not tested   Solid     Solid: Not tested      Sonia Baller, MA, CCC-SLP Speech Therapy

## 2021-08-06 LAB — BASIC METABOLIC PANEL
Anion gap: 5 (ref 5–15)
BUN: 13 mg/dL (ref 8–23)
CO2: 34 mmol/L — ABNORMAL HIGH (ref 22–32)
Calcium: 8.5 mg/dL — ABNORMAL LOW (ref 8.9–10.3)
Chloride: 98 mmol/L (ref 98–111)
Creatinine, Ser: 0.6 mg/dL (ref 0.44–1.00)
GFR, Estimated: >60 mL/min (ref >60)
Glucose, Bld: 93 mg/dL (ref 70–99)
Potassium: 3.1 mmol/L — ABNORMAL LOW (ref 3.5–5.1)
Sodium: 137 mmol/L (ref 135–145)

## 2021-08-06 LAB — CBC
HCT: 33.4 % — ABNORMAL LOW (ref 36.0–46.0)
Hemoglobin: 10.9 g/dL — ABNORMAL LOW (ref 12.0–15.0)
MCH: 26.4 pg (ref 26.0–34.0)
MCHC: 32.6 g/dL (ref 30.0–36.0)
MCV: 80.9 fL (ref 80.0–100.0)
Platelets: 483 10*3/uL — ABNORMAL HIGH (ref 150–400)
RBC: 4.13 MIL/uL (ref 3.87–5.11)
RDW: 15.7 % — ABNORMAL HIGH (ref 11.5–15.5)
WBC: 5.6 10*3/uL (ref 4.0–10.5)
nRBC: 0 % (ref 0.0–0.2)

## 2021-08-06 LAB — PHOSPHORUS: Phosphorus: 2.5 mg/dL (ref 2.5–4.6)

## 2021-08-06 LAB — MAGNESIUM: Magnesium: 2 mg/dL (ref 1.7–2.4)

## 2021-08-06 LAB — BRAIN NATRIURETIC PEPTIDE: B Natriuretic Peptide: 417.5 pg/mL — ABNORMAL HIGH (ref 0.0–100.0)

## 2021-08-06 LAB — METHYLMALONIC ACID, SERUM: Methylmalonic Acid, Quantitative: 226 nmol/L (ref 0–378)

## 2021-08-06 MED ORDER — LIP MEDEX EX OINT
TOPICAL_OINTMENT | CUTANEOUS | Status: DC | PRN
  Filled 2021-08-06: qty 7

## 2021-08-06 MED ORDER — POTASSIUM CHLORIDE CRYS ER 20 MEQ PO TBCR
40.0000 meq | EXTENDED_RELEASE_TABLET | ORAL | Status: AC
  Administered 2021-08-06 (×3): 40 meq via ORAL
  Filled 2021-08-06 (×3): qty 2

## 2021-08-06 NOTE — Progress Notes (Signed)
PROGRESS NOTE    [name]  ZMO:[id number] DOB: 18-Dec-1945 DOA: 07/31/2021 PCP: [place], [name], MD   Chief Complaint  Patient presents with   Flank Pain   Back Pain   Brief Narrative:  75 y.o. female with medical history significant of systolic CHF, hypertension, hypothyroidism, hyperlipidemia, PVD, GERD, PAD, CAD, OSA, chronic respiratory failure on 2L oxygen [place] prn, chronic mesenteric ischemia and breast cancer who presented with left thoracic pain under her left breast that started about 2 days prior to admission.  Pain was described as Janeva Peaster sharp stabbing pain.  In the emergency department she underwent work-up including chest x-ray, CT angiogram chest along with CT abdomen and pelvis.  No PE was noted.  Pneumonia was noted.  Patient was hospitalized for further management.  Currently being treated for CAP and COPD exacerbation.  She's now developed heart failure, transfer to stepdown for bipap.  Will follow for need for pulm involvement and evaluation.  Assessment & Plan:   Principal Problem:   Sepsis due to pneumonia [facility]) Active Problems:   Hypothyroidism   Hyperlipidemia   Essential hypertension   COPD, frequent exacerbations ([place])   GERD   PAD (peripheral artery disease) (HCC)   Tobacco abuse   CAD (coronary artery disease)   Chronic respiratory failure with hypoxia (HCC)   OSA (obstructive sleep apnea)   Hypokalemia   Prediabetes   Systolic CHF, chronic (HCC)   CAP (community acquired pneumonia)  Acute Hypoxic Respiratory Failure Thought 2/2 HF and COPD exacerbation as well as pneumonia Improved today, transfer to floor, continue nightly bipap Continues to require more than her baseline 2 L at night, follow  Acute on Chronic systolic and diastolic diastolic CHF  Volume Overload Last echocardiogram from 2021 showed mildly depressed EF of 45 to 50%.  Repeat echo with EF 03-54%, grade 2 diastolic dysfunction Lasix initially held due to hypotension, now resolved  -> now appears she's overloaded Weight improved, follow  Net negative Continue IV lasix, follow, she's improving  COPD Exacerbation  History of COPD, Gold stage III/chronic respiratory failure with hypoxia/tobacco abuse Uses 2 L of oxygen at home but mainly at nighttime.   Continuing to wheeze and have coarse cough Requiring 4 L, wean as tolerated On abx as noted below Getting solucortef given concern for adrenal insufficiency below (lower suspicion) -> will start to taper Continue scheduled and prn nebs.    Community-acquired pneumonia  Sepsis Patient was noted to have leukocytosis, tachycardia, tachypnea.  Noted to have elevated lactic acid level at 3.0.  Follow-up culture data - pending blood cultures - NGTDx2 days Follow sputum cx pending, MRSA PCR negative Urine strep negative, urine legionella negative RVP negative.  Negative influenza, covid. Continue ceftriaxone (10/17-present), azithromycin (completed 5 days) CXR 10/20 with lingular consolidation, trace R effusion WBC count improved, don't think we need to broaden abx, worsening suspected due to HF and COPD   Hypotension/possible adrenal insufficiency Suspect 2/2 above BP improved Taper steroids, low suspicion for adrenal insuffiency based on history of steroids  Lasix resumed as above now with HF exacerbation Echo with hypotension as noted below Most recent BP improved, follow closely  History of coronary artery disease/peripheral artery disease/chronic mesenteric ischemia Underwent heart cath in 2021 which did not show any significant obstructive CAD. She had history of left subclavian stenting in the past.  Mesenteric disease is being treated medically.   She is on aspirin and Plavix, Pletal as well as statin treatment.   Normocytic anemia Drop  in hemoglobin appears to be dilutional.  No evidence of overt blood loss noted.  Baseline hemoglobin seems to be around 10-12. Low normal B12, follow MMA - supplement Iron  def noted as well - supplement   Hypokalemia Replace PO/Oral, follow Mag ok  Obstructive sleep apnea Is supposed to be on CPAP at night but does not use it like she should.  Hypothyroidism Continue levothyroxine.  History of GERD Continue PPI   History of anxiety/depression/insomnia Continue home medications.  DVT prophylaxis: lovenox Code Status: DNR Family Communication: none at bedside - husband at bedside 10/20 Disposition:   Status is: Inpatient  Remains inpatient appropriate because: need for IV abx     Consultants:  none  Procedures:  Echo IMPRESSIONS     1. Left ventricular ejection fraction, by estimation, is 55 to 60%. The  left ventricle has normal function. The left ventricle has no regional  wall motion abnormalities. Left ventricular diastolic parameters are  consistent with Grade II diastolic  dysfunction (pseudonormalization).   2. Right ventricular systolic function is normal. The right ventricular  size is normal. Tricuspid regurgitation signal is inadequate for assessing  PA pressure.   3. The mitral valve is normal in structure. No evidence of mitral valve  regurgitation. No evidence of mitral stenosis.   4. The aortic valve is normal in structure. Aortic valve regurgitation is  not visualized. No aortic stenosis is present.   5. The inferior vena cava is dilated in size with >50% respiratory  variability, suggesting right atrial pressure of 8 mmHg.   Comparison(s): Compared to prior echo in 05/21/2020, there has been  improvement in the Left ventricular ejection fraction was 45-50% now  55-60%.   Antimicrobials:  Anti-infectives (From admission, onward)    Start     Dose/Rate Route Frequency Ordered Stop   08/01/21 1800  azithromycin (ZITHROMAX) 500 mg in sodium chloride 0.9 % 250 mL IVPB  Status:  Discontinued        500 mg 250 mL/hr over 60 Minutes Intravenous Every 24 hours 07/31/21 1709 08/05/21 0835   08/01/21 1500  cefTRIAXone  (ROCEPHIN) 1 g in sodium chloride 0.9 % 100 mL IVPB        1 g 200 mL/hr over 30 Minutes Intravenous Every 24 hours 07/31/21 1708     07/31/21 1500  cefTRIAXone (ROCEPHIN) 1 g in sodium chloride 0.9 % 100 mL IVPB        1 g 200 mL/hr over 30 Minutes Intravenous  Once 07/31/21 1454 07/31/21 1552   07/31/21 1500  azithromycin (ZITHROMAX) 500 mg in sodium chloride 0.9 % 250 mL IVPB        500 mg 250 mL/hr over 60 Minutes Intravenous  Once 07/31/21 1454 07/31/21 1850          Subjective: No complaints, feeling progressively better, still not to baseline  Objective: Vitals:   08/06/21 0624 08/06/21 0625 08/06/21 1334 08/06/21 1348  BP:    123/80  Pulse:    92  Resp:    18  Temp:    98.8 F (37.1 C)  TempSrc:    Oral  SpO2: 95% 95% 96% 92%  Weight:      Height:        Intake/Output Summary (Last 24 hours) at 08/06/2021 1442 Last data filed at 08/06/2021 1300 Gross per 24 hour  Intake 1216.63 ml  Output 4500 ml  Net -3283.37 ml   Filed Weights   08/04/21 0500 08/05/21 0500 08/06/21 0500  Weight: 61.1 kg 60.4 kg 59.6 kg    Examination:  General: No acute distress. Cardiovascular: RRR Lungs: diffuse coarse breath sounds when sitting forward, frequent cough  Abdomen: Soft, nontender, nondistended  Neurological: Alert and oriented 3. Moves all extremities 4 . Cranial nerves II through XII grossly intact. Skin: Warm and dry. No rashes or lesions. Extremities: No clubbing or cyanosis. No edema.   Data Reviewed: I have personally reviewed following labs and imaging studies  CBC: Recent Labs  Lab 07/31/21 1150 08/01/21 0503 08/02/21 0519 08/03/21 0544 08/04/21 0238 08/05/21 0251 08/06/21 0438  WBC 26.3*   < > 8.3 8.0 6.5 5.5 5.6  NEUTROABS 23.5*  --   --  6.9 5.6 4.4  --   HGB 11.5*   < > 9.8* 10.1* 10.3* 10.6* 10.9*  HCT 34.6*   < > 31.3* 30.5* 31.8* 31.6* 33.4*  MCV 80.8   < > 84.4 81.8 81.1 79.2* 80.9  PLT 435*   < > 382 446* 462* 466* 483*   < > =  values in this interval not displayed.    Basic Metabolic Panel: Recent Labs  Lab 07/31/21 1729 08/01/21 0503 08/03/21 0544 08/04/21 0238 08/04/21 1441 08/05/21 0251 08/06/21 0438  NA  --    < > 136 138 136 138 137  K  --    < > 3.8 2.6* 3.7 3.2* 3.1*  CL  --    < > 107 102 97* 98 98  CO2  --    < > 22 30 28  33* 34*  GLUCOSE  --    < > 135* 122* 185* 133* 93  BUN  --    < > 11 14 20 12 13   CREATININE  --    < > 0.62 0.75 0.72 0.63 0.60  CALCIUM  --    < > 9.1 8.3* 8.6* 8.6* 8.5*  MG 1.7  --  2.1 1.9  --  2.0 2.0  PHOS  --   --  3.1 3.3  --  3.1 2.5   < > = values in this interval not displayed.    GFR: Estimated Creatinine Clearance: 56 mL/min (by C-G formula based on SCr of 0.6 mg/dL).  Liver Function Tests: Recent Labs  Lab 07/31/21 1150 08/03/21 0544 08/04/21 0238 08/05/21 0251  AST 31 50* 30 21  ALT 24 50* 39 33  ALKPHOS 76 89 78 73  BILITOT 0.7 0.5 0.4 0.4  PROT 7.6 6.7 6.4* 6.5  ALBUMIN 3.7 3.1* 2.9* 3.1*    CBG: No results for input(s): GLUCAP in the last 168 hours.   Recent Results (from the past 240 hour(s))  Respiratory (~20 pathogens) panel by PCR     Status: None   Collection Time: 07/31/21 11:07 AM   Specimen: Nasopharyngeal Swab; Respiratory  Result Value Ref Range Status   Adenovirus NOT DETECTED NOT DETECTED Final   Coronavirus 229E NOT DETECTED NOT DETECTED Final    Comment: (NOTE) The Coronavirus on the Respiratory Panel, DOES NOT test for the novel  Coronavirus (2019 nCoV)    Coronavirus HKU1 NOT DETECTED NOT DETECTED Final   Coronavirus NL63 NOT DETECTED NOT DETECTED Final   Coronavirus OC43 NOT DETECTED NOT DETECTED Final   Metapneumovirus NOT DETECTED NOT DETECTED Final   Rhinovirus / Enterovirus NOT DETECTED NOT DETECTED Final   Influenza Elynn Patteson NOT DETECTED NOT DETECTED Final   Influenza B NOT DETECTED NOT DETECTED Final   Parainfluenza Virus 1 NOT DETECTED NOT DETECTED Final  Parainfluenza Virus 2 NOT DETECTED NOT DETECTED Final    Parainfluenza Virus 3 NOT DETECTED NOT DETECTED Final   Parainfluenza Virus 4 NOT DETECTED NOT DETECTED Final   Respiratory Syncytial Virus NOT DETECTED NOT DETECTED Final   Bordetella pertussis NOT DETECTED NOT DETECTED Final   Bordetella Parapertussis NOT DETECTED NOT DETECTED Final   Chlamydophila pneumoniae NOT DETECTED NOT DETECTED Final   Mycoplasma pneumoniae NOT DETECTED NOT DETECTED Final    Comment: Performed at [facility], [place] [address] [place], [place] [address]  Resp Panel by RT-PCR (Flu Ensley Blas&B, Covid) Nasopharyngeal Swab     Status: None   Collection Time: 07/31/21  3:27 PM   Specimen: Nasopharyngeal Swab; Nasopharyngeal(NP) swabs in vial transport medium  Result Value Ref Range Status   SARS Coronavirus 2 by RT PCR NEGATIVE NEGATIVE Final    Comment: (NOTE) SARS-CoV-2 target nucleic acids are NOT DETECTED.  The SARS-CoV-2 RNA is generally detectable in upper respiratory specimens during the acute phase of infection. The lowest concentration of SARS-CoV-2 viral copies this assay can detect is 138 copies/mL. Aaliyana Fredericks negative result does not preclude SARS-Cov-2 infection and should not be used as the sole basis for treatment or other patient management decisions. Allen Egerton negative result may occur with  improper specimen collection/handling, submission of specimen other than nasopharyngeal swab, presence of viral mutation(s) within the areas targeted by this assay, and inadequate number of viral copies(<138 copies/mL). Brookelle Pellicane negative result must be combined with clinical observations, patient history, and epidemiological information. The expected result is Negative.  Fact Sheet for Patients:  EntrepreneurPulse.com.au  Fact Sheet for Healthcare Providers:  IncredibleEmployment.be  This test is no t yet approved or cleared by the [place] FDA and  has been authorized for detection and/or diagnosis of SARS-CoV-2 by FDA under an Emergency  Use Authorization (EUA). This EUA will remain  in effect (meaning this test can be used) for the duration of the COVID-19 declaration under Section 564(b)(1) of the Act, 21 U.S.C.section 360bbb-3(b)(1), unless the authorization is terminated  or revoked sooner.       Influenza Karen Huhta by PCR NEGATIVE NEGATIVE Final   Influenza B by PCR NEGATIVE NEGATIVE Final    Comment: (NOTE) The Xpert Xpress SARS-CoV-2/FLU/RSV plus assay is intended as an aid in the diagnosis of influenza from Nasopharyngeal swab specimens and should not be used as Audrianna Driskill sole basis for treatment. Nasal washings and aspirates are unacceptable for Xpert Xpress SARS-CoV-2/FLU/RSV testing.  Fact Sheet for Patients: EntrepreneurPulse.com.au  Fact Sheet for Healthcare Providers: IncredibleEmployment.be  This test is not yet approved or cleared by the [place] FDA and has been authorized for detection and/or diagnosis of SARS-CoV-2 by FDA under an Emergency Use Authorization (EUA). This EUA will remain in effect (meaning this test can be used) for the duration of the COVID-19 declaration under Section 564(b)(1) of the Act, 21 U.S.C. section 360bbb-3(b)(1), unless the authorization is terminated or revoked.  Performed at [facility], [place] [address], [place], [place] [address]   Blood culture (routine x 2)     Status: None   Collection Time: 07/31/21  5:27 PM   Specimen: BLOOD LEFT FOREARM  Result Value Ref Range Status   Specimen Description   Final    BLOOD LEFT FOREARM Performed at [facility] [address], [place], [place] [address]    Special Requests   Final    BOTTLES DRAWN AEROBIC AND ANAEROBIC Blood Culture results may not be optimal due  to an inadequate volume of blood received in culture bottles Performed at [place] [address], [place], [place] [address]    Culture   Final    NO GROWTH 5  DAYS Performed at [facility], [place] [address], [place], [place] [address]    Report Status 08/05/2021 FINAL  Final  Blood culture (routine x 2)     Status: None   Collection Time: 07/31/21  7:52 PM   Specimen: BLOOD LEFT WRIST  Result Value Ref Range Status   Specimen Description   Final    BLOOD LEFT WRIST Performed at [facility] [address], [place], [place] [address]    Special Requests   Final    Blood Culture results may not be optimal due to an inadequate volume of blood received in culture bottles BOTTLES DRAWN AEROBIC ONLY Performed at [facility], [place] [address] [place], [place] [address]    Culture   Final    NO GROWTH 5 DAYS Performed at [facility], [place] [address] [place], [place] [address]    Report Status 08/05/2021 FINAL  Final  MRSA Next Gen by PCR, Nasal     Status: None   Collection Time: 08/03/21  9:58 AM   Specimen: Nasal Mucosa; Nasal Swab  Result Value Ref Range Status   MRSA by PCR Next Gen NOT DETECTED NOT DETECTED Final    Comment: (NOTE) The GeneXpert MRSA Assay (FDA approved for NASAL specimens only), is one component of Virgilia Quigg comprehensive MRSA colonization surveillance program. It is not intended to diagnose MRSA infection nor to guide or monitor treatment for MRSA infections. Test performance is not FDA approved in patients less than 52 years old. Performed at [facility], [place] [address], [place], [place] [address]          Radiology Studies: No results found.      Scheduled Meds:  aspirin EC  81 mg Oral Daily   atorvastatin  80 mg Oral Daily   budesonide  0.5 mg Nebulization BID   chlorhexidine  15 mL Mouth Rinse BID   Chlorhexidine Gluconate Cloth  6 each Topical Daily   cilostazol  100 mg Oral BID   clopidogrel  75 mg Oral Daily   clotrimazole  10 mg Oral 5 X Daily   enoxaparin (LOVENOX) injection  40 mg Subcutaneous Q24H   ferrous sulfate  325 mg Oral Q breakfast    fluticasone  1 spray Each Nare BID   fluticasone furoate-vilanterol  1 puff Inhalation Daily   And   umeclidinium bromide  1 puff Inhalation Daily   furosemide  40 mg Intravenous BID   guaiFENesin  600 mg Oral Daily   hydrOXYzine  25 mg Oral QHS   ipratropium-albuterol  3 mL Nebulization TID   levothyroxine  112 mcg Oral QAC breakfast   LORazepam  1 mg Oral QHS   mouth rinse  15 mL Mouth Rinse q12n4p   montelukast  10 mg Oral QHS   pantoprazole  80 mg Oral Q1200   PARoxetine  20 mg Oral QHS   potassium chloride  40 mEq Oral Q4H   predniSONE  60 mg Oral Q breakfast   Followed by   Derrill Memo ON 08/08/2021] predniSONE  40 mg Oral Q breakfast   Followed by   Derrill Memo ON 08/10/2021] predniSONE  30 mg Oral Q breakfast   Followed by   Derrill Memo ON 08/12/2021] predniSONE  20  mg Oral Q breakfast   Followed by   Derrill Memo ON 08/14/2021] predniSONE  10 mg Oral Q breakfast   sodium chloride flush  3 mL Intravenous Q12H   tamsulosin  0.4 mg Oral QHS   traZODone  100 mg Oral QHS   vitamin B-12  1,000 mcg Oral Daily   Continuous Infusions:  sodium chloride Stopped (08/05/21 1930)   cefTRIAXone (ROCEPHIN)  IV Stopped (08/05/21 1505)     LOS: 5 days    Time spent: over 30 min    Fayrene Helper, MD [facility]   To contact the attending provider between 7A-7P or the covering provider during after hours 7P-7A, please log into the web site [email or web address] and access using universal [place] password for that web site. If you do not have the password, please call the hospital operator.  08/06/2021, 2:42 PM

## 2021-08-07 DIAGNOSIS — J189 Pneumonia, unspecified organism: Secondary | ICD-10-CM | POA: Diagnosis not present

## 2021-08-07 DIAGNOSIS — A419 Sepsis, unspecified organism: Secondary | ICD-10-CM | POA: Diagnosis not present

## 2021-08-07 LAB — COMPREHENSIVE METABOLIC PANEL
ALT: 26 U/L (ref 0–44)
AST: 19 U/L (ref 15–41)
Albumin: 3.2 g/dL — ABNORMAL LOW (ref 3.5–5.0)
Alkaline Phosphatase: 64 U/L (ref 38–126)
Anion gap: 5 (ref 5–15)
BUN: 18 mg/dL (ref 8–23)
CO2: 30 mmol/L (ref 22–32)
Calcium: 8.4 mg/dL — ABNORMAL LOW (ref 8.9–10.3)
Chloride: 102 mmol/L (ref 98–111)
Creatinine, Ser: 0.75 mg/dL (ref 0.44–1.00)
GFR, Estimated: >60 mL/min (ref >60)
Glucose, Bld: 99 mg/dL (ref 70–99)
Potassium: 4.3 mmol/L (ref 3.5–5.1)
Sodium: 137 mmol/L (ref 135–145)
Total Bilirubin: 0.3 mg/dL (ref 0.3–1.2)
Total Protein: 6.5 g/dL (ref 6.5–8.1)

## 2021-08-07 LAB — CBC WITH DIFFERENTIAL/PLATELET
Abs Immature Granulocytes: 0.08 10*3/uL — ABNORMAL HIGH (ref 0.00–0.07)
Basophils Absolute: 0 10*3/uL (ref 0.0–0.1)
Basophils Relative: 0 %
Eosinophils Absolute: 0 10*3/uL (ref 0.0–0.5)
Eosinophils Relative: 1 %
HCT: 36.3 % (ref 36.0–46.0)
Hemoglobin: 11.5 g/dL — ABNORMAL LOW (ref 12.0–15.0)
Immature Granulocytes: 1 %
Lymphocytes Relative: 20 %
Lymphs Abs: 1.3 10*3/uL (ref 0.7–4.0)
MCH: 25.9 pg — ABNORMAL LOW (ref 26.0–34.0)
MCHC: 31.7 g/dL (ref 30.0–36.0)
MCV: 81.8 fL (ref 80.0–100.0)
Monocytes Absolute: 0.8 10*3/uL (ref 0.1–1.0)
Monocytes Relative: 13 %
Neutro Abs: 4.1 10*3/uL (ref 1.7–7.7)
Neutrophils Relative %: 65 %
Platelets: 513 10*3/uL — ABNORMAL HIGH (ref 150–400)
RBC: 4.44 MIL/uL (ref 3.87–5.11)
RDW: 15.9 % — ABNORMAL HIGH (ref 11.5–15.5)
WBC: 6.4 10*3/uL (ref 4.0–10.5)
nRBC: 0 % (ref 0.0–0.2)

## 2021-08-07 LAB — MAGNESIUM: Magnesium: 2.2 mg/dL (ref 1.7–2.4)

## 2021-08-07 LAB — BRAIN NATRIURETIC PEPTIDE: B Natriuretic Peptide: 159.7 pg/mL — ABNORMAL HIGH (ref 0.0–100.0)

## 2021-08-07 LAB — PHOSPHORUS: Phosphorus: 3 mg/dL (ref 2.5–4.6)

## 2021-08-07 MED ORDER — MENTHOL 3 MG MT LOZG
1.0000 | LOZENGE | OROMUCOSAL | Status: DC | PRN
  Administered 2021-08-07: 3 mg via ORAL
  Filled 2021-08-07: qty 9

## 2021-08-07 NOTE — Care Management Important Message (Signed)
Important Message  Patient Details IM Letter placed in Patients room. Name: [name] MRN: [id number] Date of Birth: 05/01/1946   Medicare Important Message Given:  Yes     Kerin Salen 08/07/2021, 1:48 PM

## 2021-08-07 NOTE — Evaluation (Signed)
Physical Therapy Evaluation Patient Details Name: [name] MRN: [id number] DOB: Mar 16, 1946 Today's Date: 08/07/2021  History of Present Illness  75 y.o. female with medical history significant of systolic CHF, hypertension, hypothyroidism, hyperlipidemia, PVD, GERD, PAD, CAD, OSA, chronic respiratory failure on 2L oxygen [place] prn, chronic mesenteric ischemia,  COPD, and breast cancer who presented with left thoracic pain under her left breast that started about 2 days prior to admission.  Pt admitted with acute hypoxic respiratory failure 2/2 HF and COPD exacerbation as well as pneumonia  Clinical Impression  Pt admitted with above diagnosis. Pt currently with functional limitations due to the deficits listed below (see PT Problem List). Pt will benefit from skilled PT to increase their independence and safety with mobility to allow discharge to the venue listed below.  Pt assisted with ambulating in hallway short distance and utilized IV pole.  Pt has SPC at home she is willing to use (denies need for using RW upon d/c).  Pt currently on 4L upon arrival to room however able to tolerate 3L O2 [place] with mobilizing (see below) and RN aware.      Recommendations for follow up therapy are one component of a multi-disciplinary discharge planning process, led by the attending physician.  Recommendations may be updated based on patient status, additional functional criteria and insurance authorization.  Follow Up Recommendations Home health PT    Assistance Recommended at Discharge PRN  Functional Status Assessment Patient has had a recent decline in their functional status and demonstrates the ability to make significant improvements in function in a reasonable and predictable amount of time.  Equipment Recommendations  None recommended by PT    Recommendations for Other Services       Precautions / Restrictions Precautions Precautions: Other (comment) Precaution Comments: 2L O2 [place] at night  baseline per pt      Mobility  Bed Mobility Overal bed mobility: Modified Independent             General bed mobility comments: Pt on 4L O2 [place] in room and SPO2 95%, pt agreeable to attempt 3L O2 for mobilizing    Transfers Overall transfer level: Needs assistance Equipment used: None Transfers: Sit to/from Stand Sit to Stand: Min guard           General transfer comment: min/guard for safety    Ambulation/Gait Ambulation/Gait assistance: Min guard Gait Distance (Feet): 80 Feet Assistive device: IV Pole Gait Pattern/deviations: Step-through pattern;Decreased stride length Gait velocity: decr   General Gait Details: pt wanting UE support so utilized IV pole, SpO2 mostly 94% on 3L O2 Why (briefly dropped to only 90%)  Science writer    Modified Rankin (Stroke Patients Only)       Balance                                             Pertinent Vitals/Pain Pain Assessment: No/denies pain    Home Living Family/patient expects to be discharged to:: Private residence Living Arrangements: Spouse/significant other Available Help at Discharge: Family;Available PRN/intermittently Type of Home: House Home Access: Stairs to enter Entrance Stairs-Rails: Can reach both Entrance Stairs-Number of Steps: 6 Alternate Level Stairs-Number of Steps: 9 Home Layout: Two level;1/2 bath on main level;Bed/bath upstairs Home Equipment: Cane - single point;Wheelchair - manual  Prior Function Prior Level of Function : Independent/Modified Independent                     Hand Dominance        Extremity/Trunk Assessment        Lower Extremity Assessment Lower Extremity Assessment: Generalized weakness    Cervical / Trunk Assessment Cervical / Trunk Assessment: Normal  Communication   Communication: HOH  Cognition Arousal/Alertness: Awake/alert Behavior During Therapy: WFL for tasks  assessed/performed Overall Cognitive Status: Within Functional Limits for tasks assessed                                          General Comments      Exercises     Assessment/Plan    PT Assessment Patient needs continued PT services  PT Problem List Decreased mobility;Decreased activity tolerance;Decreased knowledge of use of DME;Decreased balance;Cardiopulmonary status limiting activity       PT Treatment Interventions Gait training;DME instruction;Therapeutic exercise;Balance training;Functional mobility training;Therapeutic activities;Patient/family education;Stair training    PT Goals (Current goals can be found in the Care Plan section)  Acute Rehab PT Goals PT Goal Formulation: With patient Time For Goal Achievement: 08/21/21 Potential to Achieve Goals: Good    Frequency Min 3X/week   Barriers to discharge        Co-evaluation               AM-PAC PT "6 Clicks" Mobility  Outcome Measure Help needed turning from your back to your side while in a flat bed without using bedrails?: A Little Help needed moving from lying on your back to sitting on the side of a flat bed without using bedrails?: A Little Help needed moving to and from a bed to a chair (including a wheelchair)?: A Little Help needed standing up from a chair using your arms (e.g., wheelchair or bedside chair)?: A Little Help needed to walk in hospital room?: A Little Help needed climbing 3-5 steps with a railing? : A Little 6 Click Score: 18    End of Session Equipment Utilized During Treatment: Gait belt;Oxygen Activity Tolerance: Patient tolerated treatment well Patient left: in chair;with call bell/phone within reach;with chair alarm set Nurse Communication: Mobility status PT Visit Diagnosis: Difficulty in walking, not elsewhere classified (R26.2)    Time: 3875-6433 PT Time Calculation (min) (ACUTE ONLY): 15 min   Charges:   PT Evaluation $PT Eval Low Complexity: 1  Low        Kati PT, DPT Acute Rehabilitation Services Pager: [phone] Office: [place] 08/07/2021, 4:45 PM

## 2021-08-07 NOTE — Progress Notes (Signed)
PROGRESS NOTE    [name]  GLO:[id number] DOB: 10-30-1945 DOA: 07/31/2021 PCP: [name], [name], MD   Chief Complaint  Patient presents with   Flank Pain   Back Pain   Brief Narrative:  75 y.o. female with medical history significant of systolic CHF, hypertension, hypothyroidism, hyperlipidemia, PVD, GERD, PAD, CAD, OSA, chronic respiratory failure on 2L oxygen [place] prn, chronic mesenteric ischemia and breast cancer who presented with left thoracic pain under her left breast that started about 2 days prior to admission.  Pain was described as Anorah Trias sharp stabbing pain.  In the emergency department she underwent work-up including chest x-ray, CT angiogram chest along with CT abdomen and pelvis.  No PE was noted.  Pneumonia was noted.  Patient was hospitalized for further management.  Currently being treated for CAP and COPD exacerbation.  She's now developed heart failure, transfer to stepdown for bipap.  Will follow for need for pulm involvement and evaluation.  Assessment & Plan:   Principal Problem:   Sepsis due to pneumonia [facility]) Active Problems:   Hypothyroidism   Hyperlipidemia   Essential hypertension   COPD, frequent exacerbations ([place])   GERD   PAD (peripheral artery disease) (HCC)   Tobacco abuse   CAD (coronary artery disease)   Chronic respiratory failure with hypoxia (HCC)   OSA (obstructive sleep apnea)   Hypokalemia   Prediabetes   Systolic CHF, chronic (HCC)   CAP (community acquired pneumonia)  Acute Hypoxic Respiratory Failure Thought 2/2 HF and COPD exacerbation as well as pneumonia Improved today, transfer to floor, continue nightly bipap Continues to require more than her baseline 2 L at night, follow  Acute on Chronic systolic and diastolic diastolic CHF  Volume Overload Last echocardiogram from 2021 showed mildly depressed EF of 45 to 50%.  Repeat echo with EF 18-84%, grade 2 diastolic dysfunction Lasix initially held due to hypotension, now resolved  -> now appears she's overloaded Weight improving, follow  Net negative Continue IV lasix, follow, she's improving  COPD Exacerbation  History of COPD, Gold stage III/chronic respiratory failure with hypoxia/tobacco abuse Uses 2 L of oxygen at home but mainly at nighttime.   Continuing to wheeze and have coarse cough Requiring 4 L, wean as tolerated On abx as noted below Getting solucortef given concern for adrenal insufficiency below (lower suspicion) -> extended taper, follow Continue scheduled and prn nebs.    Community-acquired pneumonia  Sepsis Patient was noted to have leukocytosis, tachycardia, tachypnea.  Noted to have elevated lactic acid level at 3.0.  Follow-up culture data - pending blood cultures - NGTDx2 days Follow sputum cx pending collection, MRSA PCR negative Urine strep negative, urine legionella negative RVP negative.  Negative influenza, covid. Continue ceftriaxone (10/17-24), azithromycin (completed 5 days) CXR 10/20 with lingular consolidation, trace R effusion WBC count improved, don't think we need to broaden abx, worsening suspected due to HF and COPD   Hypotension/possible adrenal insufficiency Suspect 2/2 above BP improved Taper steroids, low suspicion for adrenal insuffiency based on history of steroids  Lasix resumed as above now with HF exacerbation Echo with hypotension as noted below Most recent BP improved, follow closely  History of coronary artery disease/peripheral artery disease/chronic mesenteric ischemia Underwent heart cath in 2021 which did not show any significant obstructive CAD. She had history of left subclavian stenting in the past.  Mesenteric disease is being treated medically.   She is on aspirin and Plavix, Pletal as well as statin treatment.   Normocytic anemia Drop  in hemoglobin appears to be dilutional.  No evidence of overt blood loss noted.  Baseline hemoglobin seems to be around 10-12. Low normal B12, follow MMA wnl, d/c  supplementation Iron def noted as well - supplement   Hypokalemia Replace PO/Oral, follow Mag ok  Obstructive sleep apnea Is supposed to be on CPAP at night but does not use it like she should.  Hypothyroidism Continue levothyroxine.  History of GERD Continue PPI   History of anxiety/depression/insomnia Continue home medications.  DVT prophylaxis: lovenox Code Status: DNR Family Communication: none at bedside - husband at bedside 10/24 Disposition:   Status is: Inpatient  Remains inpatient appropriate because: need for IV abx     Consultants:  none  Procedures:  Echo IMPRESSIONS     1. Left ventricular ejection fraction, by estimation, is 55 to 60%. The  left ventricle has normal function. The left ventricle has no regional  wall motion abnormalities. Left ventricular diastolic parameters are  consistent with Grade II diastolic  dysfunction (pseudonormalization).   2. Right ventricular systolic function is normal. The right ventricular  size is normal. Tricuspid regurgitation signal is inadequate for assessing  PA pressure.   3. The mitral valve is normal in structure. No evidence of mitral valve  regurgitation. No evidence of mitral stenosis.   4. The aortic valve is normal in structure. Aortic valve regurgitation is  not visualized. No aortic stenosis is present.   5. The inferior vena cava is dilated in size with >50% respiratory  variability, suggesting right atrial pressure of 8 mmHg.   Comparison(s): Compared to prior echo in 05/21/2020, there has been  improvement in the Left ventricular ejection fraction was 45-50% now  55-60%.   Antimicrobials:  Anti-infectives (From admission, onward)    Start     Dose/Rate Route Frequency Ordered Stop   08/01/21 1800  azithromycin (ZITHROMAX) 500 mg in sodium chloride 0.9 % 250 mL IVPB  Status:  Discontinued        500 mg 250 mL/hr over 60 Minutes Intravenous Every 24 hours 07/31/21 1709 08/05/21 0835    08/01/21 1500  cefTRIAXone (ROCEPHIN) 1 g in sodium chloride 0.9 % 100 mL IVPB        1 g 200 mL/hr over 30 Minutes Intravenous Every 24 hours 07/31/21 1708     07/31/21 1500  cefTRIAXone (ROCEPHIN) 1 g in sodium chloride 0.9 % 100 mL IVPB        1 g 200 mL/hr over 30 Minutes Intravenous  Once 07/31/21 1454 07/31/21 1552   07/31/21 1500  azithromycin (ZITHROMAX) 500 mg in sodium chloride 0.9 % 250 mL IVPB        500 mg 250 mL/hr over 60 Minutes Intravenous  Once 07/31/21 1454 07/31/21 1850          Subjective: Feels Mackenze Grandison bit better, doesn't feel ready for home yet  Objective: Vitals:   08/07/21 0502 08/07/21 0754 08/07/21 1254 08/07/21 1418  BP: (!) 142/75  128/69   Pulse: 72  80   Resp: 20  18   Temp: 97.7 F (36.5 C)  98.2 F (36.8 C)   TempSrc: Oral  Oral   SpO2: 93% 95% 93% 94%  Weight: 57.8 kg     Height:        Intake/Output Summary (Last 24 hours) at 08/07/2021 1639 Last data filed at 08/07/2021 1415 Gross per 24 hour  Intake 831.77 ml  Output 3450 ml  Net -2618.23 ml   [employer]  08/05/21 0500 08/06/21 0500 08/07/21 0502  Weight: 60.4 kg 59.6 kg 57.8 kg    Examination:  General: No acute distress. Cardiovascular: Heart sounds show Michala Deblanc regular rate, and rhythm.  Lungs: continued coarse breath sounds, improved somewhat, coarse cough Abdomen: Soft, nontender, nondistended  Neurological: Alert and oriented 3. Moves all extremities 4 . Cranial nerves II through XII grossly intact. Skin: Warm and dry. No rashes or lesions. Extremities: No clubbing or cyanosis. No edema.     Data Reviewed: I have personally reviewed following labs and imaging studies  CBC: Recent Labs  Lab 08/03/21 0544 08/04/21 0238 08/05/21 0251 08/06/21 0438 08/07/21 0441  WBC 8.0 6.5 5.5 5.6 6.4  NEUTROABS 6.9 5.6 4.4  --  4.1  HGB 10.1* 10.3* 10.6* 10.9* 11.5*  HCT 30.5* 31.8* 31.6* 33.4* 36.3  MCV 81.8 81.1 79.2* 80.9 81.8  PLT 446* 462* 466* 483* 513*    Basic  Metabolic Panel: Recent Labs  Lab 08/03/21 0544 08/04/21 0238 08/04/21 1441 08/05/21 0251 08/06/21 0438 08/07/21 0441  NA 136 138 136 138 137 137  K 3.8 2.6* 3.7 3.2* 3.1* 4.3  CL 107 102 97* 98 98 102  CO2 22 30 28  33* 34* 30  GLUCOSE 135* 122* 185* 133* 93 99  BUN 11 14 20 12 13 18   CREATININE 0.62 0.75 0.72 0.63 0.60 0.75  CALCIUM 9.1 8.3* 8.6* 8.6* 8.5* 8.4*  MG 2.1 1.9  --  2.0 2.0 2.2  PHOS 3.1 3.3  --  3.1 2.5 3.0    GFR: Estimated Creatinine Clearance: 54.6 mL/min (by C-G formula based on SCr of 0.75 mg/dL).  Liver Function Tests: Recent Labs  Lab 08/03/21 0544 08/04/21 0238 08/05/21 0251 08/07/21 0441  AST 50* 30 21 19   ALT 50* 39 33 26  ALKPHOS 89 78 73 64  BILITOT 0.5 0.4 0.4 0.3  PROT 6.7 6.4* 6.5 6.5  ALBUMIN 3.1* 2.9* 3.1* 3.2*    CBG: No results for input(s): GLUCAP in the last 168 hours.   Recent Results (from the past 240 hour(s))  Respiratory (~20 pathogens) panel by PCR     Status: None   Collection Time: 07/31/21 11:07 AM   Specimen: Nasopharyngeal Swab; Respiratory  Result Value Ref Range Status   Adenovirus NOT DETECTED NOT DETECTED Final   Coronavirus 229E NOT DETECTED NOT DETECTED Final    Comment: (NOTE) The Coronavirus on the Respiratory Panel, DOES NOT test for the novel  Coronavirus (2019 nCoV)    Coronavirus HKU1 NOT DETECTED NOT DETECTED Final   Coronavirus NL63 NOT DETECTED NOT DETECTED Final   Coronavirus OC43 NOT DETECTED NOT DETECTED Final   Metapneumovirus NOT DETECTED NOT DETECTED Final   Rhinovirus / Enterovirus NOT DETECTED NOT DETECTED Final   Influenza [place] NOT DETECTED NOT DETECTED Final   Influenza B NOT DETECTED NOT DETECTED Final   Parainfluenza Virus 1 NOT DETECTED NOT DETECTED Final   Parainfluenza Virus 2 NOT DETECTED NOT DETECTED Final   Parainfluenza Virus 3 NOT DETECTED NOT DETECTED Final   Parainfluenza Virus 4 NOT DETECTED NOT DETECTED Final   Respiratory Syncytial Virus NOT DETECTED NOT DETECTED Final    Bordetella pertussis NOT DETECTED NOT DETECTED Final   Bordetella Parapertussis NOT DETECTED NOT DETECTED Final   Chlamydophila pneumoniae NOT DETECTED NOT DETECTED Final   Mycoplasma pneumoniae NOT DETECTED NOT DETECTED Final    Comment: Performed at [facility], [place]. [address] [place], [place] [address]  Resp Panel by RT-PCR (Flu Davyn Elsasser&B, Covid)  Nasopharyngeal Swab     Status: None   Collection Time: 07/31/21  3:27 PM   Specimen: Nasopharyngeal Swab; Nasopharyngeal(NP) swabs in vial transport medium  Result Value Ref Range Status   SARS Coronavirus 2 by RT PCR NEGATIVE NEGATIVE Final    Comment: (NOTE) SARS-CoV-2 target nucleic acids are NOT DETECTED.  The SARS-CoV-2 RNA is generally detectable in upper respiratory specimens during the acute phase of infection. The lowest concentration of SARS-CoV-2 viral copies this assay can detect is 138 copies/mL. [name] negative result does not preclude SARS-Cov-2 infection and should not be used as the sole basis for treatment or other patient management decisions. [name] negative result may occur with  improper specimen collection/handling, submission of specimen other than nasopharyngeal swab, presence of viral mutation(s) within the areas targeted by this assay, and inadequate number of viral copies(<138 copies/mL). Khristen Cheyney negative result must be combined with clinical observations, patient history, and epidemiological information. The expected result is Negative.  Fact Sheet for Patients:  EntrepreneurPulse.com.au  Fact Sheet for Healthcare Providers:  IncredibleEmployment.be  This test is no t yet approved or cleared by the [place] FDA and  has been authorized for detection and/or diagnosis of SARS-CoV-2 by FDA under an Emergency Use Authorization (EUA). This EUA will remain  in effect (meaning this test can be used) for the duration of the COVID-19 declaration under Section 564(b)(1) of the Act,  21 U.S.C.section 360bbb-3(b)(1), unless the authorization is terminated  or revoked sooner.       Influenza Armie Moren by PCR NEGATIVE NEGATIVE Final   Influenza B by PCR NEGATIVE NEGATIVE Final    Comment: (NOTE) The Xpert Xpress SARS-CoV-2/FLU/RSV plus assay is intended as an aid in the diagnosis of influenza from Nasopharyngeal swab specimens and should not be used as Beryl Hornberger sole basis for treatment. Nasal washings and aspirates are unacceptable for Xpert Xpress SARS-CoV-2/FLU/RSV testing.  Fact Sheet for Patients: EntrepreneurPulse.com.au  Fact Sheet for Healthcare Providers: IncredibleEmployment.be  This test is not yet approved or cleared by the [place] FDA and has been authorized for detection and/or diagnosis of SARS-CoV-2 by FDA under an Emergency Use Authorization (EUA). This EUA will remain in effect (meaning this test can be used) for the duration of the COVID-19 declaration under Section 564(b)(1) of the Act, 21 U.S.C. section 360bbb-3(b)(1), unless the authorization is terminated or revoked.  Performed at [facility], [place] [address], [place], [place] [address]   Blood culture (routine x 2)     Status: None   Collection Time: 07/31/21  5:27 PM   Specimen: BLOOD LEFT FOREARM  Result Value Ref Range Status   Specimen Description   Final    BLOOD LEFT FOREARM Performed at [facility] [address] [place], [place] [address]    Special Requests   Final    BOTTLES DRAWN AEROBIC AND ANAEROBIC Blood Culture results may not be optimal due to an inadequate volume of blood received in culture bottles Performed at [facility] [address] [place], [place] [address]    Culture   Final    NO GROWTH 5 DAYS Performed at [facility], [place] [address] [place], [place] [address]    Report Status 08/05/2021 FINAL  Final  Blood culture (routine x 2)     Status: None    Collection Time: 07/31/21  7:52 PM   Specimen: BLOOD LEFT WRIST  Result Value Ref Range Status   Specimen Description   Final  BLOOD LEFT WRIST Performed at [facility], [place] [address] [place], [place] [address]    Special Requests   Final    Blood Culture results may not be optimal due to an inadequate volume of blood received in culture bottles BOTTLES DRAWN AEROBIC ONLY Performed at [facility], [place] [address], [place], [place] [address]    Culture   Final    NO GROWTH 5 DAYS Performed at [place] [facility], [place] [address] [place], [place] [address]    Report Status 08/05/2021 FINAL  Final  MRSA Next Gen by PCR, Nasal     Status: None   Collection Time: 08/03/21  9:58 AM   Specimen: Nasal Mucosa; Nasal Swab  Result Value Ref Range Status   MRSA by PCR Next Gen NOT DETECTED NOT DETECTED Final    Comment: (NOTE) The GeneXpert MRSA Assay (FDA approved for NASAL specimens only), is one component of Rhett Mutschler comprehensive MRSA colonization surveillance program. It is not intended to diagnose MRSA infection nor to guide or monitor treatment for MRSA infections. Test performance is not FDA approved in patients less than 35 years old. Performed at [facility], [place] [address] [place],  [address]          Radiology Studies: No results found.      Scheduled Meds:  aspirin EC  81 mg Oral Daily   atorvastatin  80 mg Oral Daily   budesonide  0.5 mg Nebulization BID   chlorhexidine  15 mL Mouth Rinse BID   Chlorhexidine Gluconate Cloth  6 each Topical Daily   cilostazol  100 mg Oral BID   clopidogrel  75 mg Oral Daily   enoxaparin (LOVENOX) injection  40 mg Subcutaneous Q24H   ferrous sulfate  325 mg Oral Q breakfast   fluticasone  1 spray Each Nare BID   fluticasone furoate-vilanterol  1 puff Inhalation Daily   And   umeclidinium bromide  1 puff Inhalation Daily   furosemide  40 mg Intravenous BID   guaiFENesin  600 mg  Oral Daily   hydrOXYzine  25 mg Oral QHS   ipratropium-albuterol  3 mL Nebulization TID   levothyroxine  112 mcg Oral QAC breakfast   LORazepam  1 mg Oral QHS   montelukast  10 mg Oral QHS   pantoprazole  80 mg Oral Q1200   PARoxetine  20 mg Oral QHS   [START ON 08/08/2021] predniSONE  40 mg Oral Q breakfast   Followed by   Derrill Memo ON 08/10/2021] predniSONE  30 mg Oral Q breakfast   Followed by   Derrill Memo ON 08/12/2021] predniSONE  20 mg Oral Q breakfast   Followed by   Derrill Memo ON 08/14/2021] predniSONE  10 mg Oral Q breakfast   sodium chloride flush  3 mL Intravenous Q12H   tamsulosin  0.4 mg Oral QHS   traZODone  100 mg Oral QHS   vitamin B-12  1,000 mcg Oral Daily   Continuous Infusions:  sodium chloride Stopped (08/05/21 1930)   cefTRIAXone (ROCEPHIN)  IV 1 g (08/07/21 1311)     LOS: 6 days    Time spent: over 30 min    Fayrene Helper, MD [facility]   To contact the attending provider between 7A-7P or the covering provider during after hours 7P-7A, please log into the web site [email or web address] and access using universal [place] password for that web site. If you do not have the password, please call the hospital  operator.  08/07/2021, 4:39 PM

## 2021-08-08 DIAGNOSIS — A419 Sepsis, unspecified organism: Secondary | ICD-10-CM | POA: Diagnosis not present

## 2021-08-08 DIAGNOSIS — J189 Pneumonia, unspecified organism: Secondary | ICD-10-CM | POA: Diagnosis not present

## 2021-08-08 LAB — CBC
HCT: 37 % (ref 36.0–46.0)
Hemoglobin: 12.2 g/dL (ref 12.0–15.0)
MCH: 26.5 pg (ref 26.0–34.0)
MCHC: 33 g/dL (ref 30.0–36.0)
MCV: 80.3 fL (ref 80.0–100.0)
Platelets: 550 10*3/uL — ABNORMAL HIGH (ref 150–400)
RBC: 4.61 MIL/uL (ref 3.87–5.11)
RDW: 15.8 % — ABNORMAL HIGH (ref 11.5–15.5)
WBC: 6 10*3/uL (ref 4.0–10.5)
nRBC: 0 % (ref 0.0–0.2)

## 2021-08-08 LAB — BASIC METABOLIC PANEL
Anion gap: 7 (ref 5–15)
BUN: 19 mg/dL (ref 8–23)
CO2: 29 mmol/L (ref 22–32)
Calcium: 8.4 mg/dL — ABNORMAL LOW (ref 8.9–10.3)
Chloride: 99 mmol/L (ref 98–111)
Creatinine, Ser: 0.76 mg/dL (ref 0.44–1.00)
GFR, Estimated: >60 mL/min (ref >60)
Glucose, Bld: 85 mg/dL (ref 70–99)
Potassium: 3.7 mmol/L (ref 3.5–5.1)
Sodium: 135 mmol/L (ref 135–145)

## 2021-08-08 LAB — BRAIN NATRIURETIC PEPTIDE: B Natriuretic Peptide: 121.3 pg/mL — ABNORMAL HIGH (ref 0.0–100.0)

## 2021-08-08 LAB — PHOSPHORUS: Phosphorus: 3.7 mg/dL (ref 2.5–4.6)

## 2021-08-08 LAB — MAGNESIUM: Magnesium: 2.1 mg/dL (ref 1.7–2.4)

## 2021-08-08 MED ORDER — PREDNISONE 10 MG PO TABS: ORAL_TABLET | ORAL | 0 refills | Status: AC

## 2021-08-08 MED ORDER — POTASSIUM CHLORIDE CRYS ER 20 MEQ PO TBCR
40.0000 meq | EXTENDED_RELEASE_TABLET | Freq: Once | ORAL | Status: AC
  Administered 2021-08-08: 40 meq via ORAL
  Filled 2021-08-08: qty 2

## 2021-08-08 MED ORDER — FERROUS SULFATE 325 (65 FE) MG PO TABS: 325.0000 mg | ORAL_TABLET | Freq: Every day | ORAL | 0 refills | Status: DC

## 2021-08-08 NOTE — Plan of Care (Signed)
  Problem: Clinical Measurements: Goal: Respiratory complications will improve Outcome: Progressing Goal: Cardiovascular complication will be avoided Outcome: Progressing   Problem: Activity: Goal: Risk for activity intolerance will decrease Outcome: Progressing   Problem: Nutrition: Goal: Adequate nutrition will be maintained Outcome: Progressing   

## 2021-08-08 NOTE — TOC Transition Note (Signed)
Transition of Care [facility]) - CM/SW Discharge Note   Patient Details  Name: [name] MRN: [id number] Date of Birth: 07-Jan-1946  Transition of Care [facility]) CM/SW Contact:  Ross Ludwig, LCSW Phone Number: 08/08/2021, 3:37 PM   Clinical Narrative:     CSW was informed that patient may benefit from home health PT.  CSW spoke to patient and offered her home health services.  Per patient she stated she has had it in the past, and does not feel like she needs it.  CSW updated bedside nurse, patient is alert and oriented x4 and able to make her own decisions.  Patient is refusing home health services.   Final next level of care: Home/Self Care Barriers to Discharge: Barriers Resolved   Patient Goals and CMS Choice Patient states their goals for this hospitalization and ongoing recovery are:: To return back home. CMS Medicare.gov Compare Post Acute Care list provided to:: Patient Choice offered to / list presented to : Patient  Discharge Placement                       Discharge Plan and Services                                     Social Determinants of Health (SDOH) Interventions     Readmission Risk Interventions Readmission Risk Prevention Plan 08/02/2021  Transportation Screening Complete  Medication Review Press photographer) Complete  PCP or Specialist appointment within 3-5 days of discharge Complete  HRI or [place] Complete  SW Recovery Care/Counseling Consult Complete  [place] Not Applicable  Some recent data might be hidden

## 2021-08-08 NOTE — Discharge Summary (Signed)
Physician Discharge Summary  [name]:[id number] DOB: 05-16-1946 DOA: 07/31/2021  PCP: [place], [name], MD  Admit date: 07/31/2021 Discharge date: 08/08/2021  Time spent: 40 minutes  Recommendations for Outpatient Follow-up:  Follow outpatient CBC/CMP Follow outpatient CXR Follow with pulmonology and cardiology outpatient Follow iron def anemia outpatient, needs additional workup Follow long term O2 needs outpatient Follow blood pressure outpatient Discharged on steroid taper  Discharge Diagnoses:  Principal Problem:   Sepsis due to pneumonia [facility]) Active Problems:   Hypothyroidism   Hyperlipidemia   Essential hypertension   COPD, frequent exacerbations ([place])   GERD   PAD (peripheral artery disease) ([place])   Tobacco abuse   CAD (coronary artery disease)   Chronic respiratory failure with hypoxia (HCC)   OSA (obstructive sleep apnea)   Hypokalemia   Prediabetes   Systolic CHF, chronic (HCC)   CAP (community acquired pneumonia)   Discharge Condition: stable  Diet recommendation: heart healthy  Filed Weights   08/06/21 0500 08/07/21 0502 08/08/21 0503  Weight: 59.6 kg 57.8 kg 58.4 kg    History of present illness:  75 y.o. female with medical history significant of systolic CHF, hypertension, hypothyroidism, hyperlipidemia, PVD, GERD, PAD, CAD, OSA, chronic respiratory failure on 2L oxygen [place] prn, chronic mesenteric ischemia and breast cancer who presented with left thoracic pain under her left breast that started about 2 days prior to admission.  Pain was described as Shasta Chinn sharp stabbing pain.  In the emergency department she underwent work-up including chest x-ray, CT angiogram chest along with CT abdomen and pelvis.  No PE was noted.  Pneumonia was noted.  Patient was hospitalized for further management.   Currently being treated for CAP and COPD exacerbation.  Hospitalization complicated by heart failure, requiring transfer to stepdown for bipap.    She's now  improved after steroids, antibiotics, and diuresis.   See below for additional details  Hospital Course:  Acute Hypoxic Respiratory Failure Thought 2/2 HF and COPD exacerbation as well as pneumonia Improved with treatment of below, follow outpatient - currently requiring 2 L with activity, follow O2 needs long term outpatient   Acute on Chronic systolic and diastolic diastolic CHF  Volume Overload Last echocardiogram from 2021 showed mildly depressed EF of 45 to 50%.  Repeat echo with EF 67-20%, grade 2 diastolic dysfunction Lasix initially held due to hypotension, now resolved, developed overload in this setting She's around her dry weight of 129 today S/p lasix. Transition to oral lasix at discharge Encouraged outpatient cardiology follow up   COPD Exacerbation  History of COPD, Gold stage III/chronic respiratory failure with hypoxia/tobacco abuse Uses 2 L of oxygen at home but mainly at nighttime.   Continuing to wheeze and have coarse cough Currently on RA at rest, requiring 2 L with activity Completed abx for CAP Steroid taper, follow outpatient  Continue scheduled and prn nebs.     Community-acquired pneumonia  Sepsis Patient was noted to have leukocytosis, tachycardia, tachypnea.  Noted to have elevated lactic acid level at 3.0.  Follow-up culture data - pending blood cultures - NGTDx2 days Follow sputum cx not collected, MRSA PCR negative Urine strep negative, urine legionella negative RVP negative.  Negative influenza, covid. Continue ceftriaxone (10/17-24), azithromycin (completed 5 days) CXR 10/20 with lingular consolidation, trace R effusion Repeat CXR outpatient    Hypotension Suspect 2/2 above BP improved I have low suspicion for adrenal insuffiencey, per my discussion with her, she was not on any extended steroids prior to admission Lasix  resumed as above now with HF exacerbation Echo with hypotension as noted below Most recent BP improved, follow  closely  History of coronary artery disease/peripheral artery disease/chronic mesenteric ischemia Underwent heart cath in 2021 which did not show any significant obstructive CAD. She had history of left subclavian stenting in the past.  Mesenteric disease is being treated medically.   She is on aspirin and Plavix, Pletal as well as statin treatment.   Normocytic anemia Drop in hemoglobin appears to be dilutional.  No evidence of overt blood loss noted.  Baseline hemoglobin seems to be around 10-12. Low normal B12, follow MMA wnl, d/c supplementation Iron def noted as well - supplement -> needs follow up and w/u outpatient   Hypokalemia Continue supplementation  Obstructive sleep apnea Encouraged cpap with 2 L O2 nightly  Hypothyroidism Continue levothyroxine.  History of GERD Continue PPI   History of anxiety/depression/insomnia Continue home medications.  Procedures: Echo IMPRESSIONS     1. Left ventricular ejection fraction, by estimation, is 55 to 60%. The  left ventricle has normal function. The left ventricle has no regional  wall motion abnormalities. Left ventricular diastolic parameters are  consistent with Grade II diastolic  dysfunction (pseudonormalization).   2. Right ventricular systolic function is normal. The right ventricular  size is normal. Tricuspid regurgitation signal is inadequate for assessing  PA pressure.   3. The mitral valve is normal in structure. No evidence of mitral valve  regurgitation. No evidence of mitral stenosis.   4. The aortic valve is normal in structure. Aortic valve regurgitation is  not visualized. No aortic stenosis is present.   5. The inferior vena cava is dilated in size with >50% respiratory  variability, suggesting right atrial pressure of 8 mmHg.   Comparison(s): Compared to prior echo in 05/21/2020, there has been  improvement in the Left ventricular ejection fraction was 45-50% now  55-60%.    Consultations: none  Discharge Exam: Vitals:   08/08/21 0800 08/08/21 1323  BP:  112/66  Pulse:  82  Resp:  18  Temp:  (!) 97.5 F (36.4 C)  SpO2: 95% 93%   Feels as good as she has when she's left hospital in past, better Dry weigth 129 Eager to discharge if able  General: No acute distress. Cardiovascular: RRR Lungs: continued coarse breath sounds, cough with deep breaths, but appears comfortable at rest Abdomen: Soft, nontender, nondistended  Neurological: Alert and oriented 3. Moves all extremities 4. Cranial nerves II through XII grossly intact. Skin: Warm and dry. No rashes or lesions. Extremities: No clubbing or cyanosis. No edema Discharge Instructions   Discharge Instructions     (HEART FAILURE PATIENTS) Call MD:  Anytime you have any of the following symptoms: 1) 3 pound weight gain in 24 hours or 5 pounds in 1 week 2) shortness of breath, with or without Alethea Terhaar dry hacking cough 3) swelling in the hands, feet or stomach 4) if you have to sleep on extra pillows at night in order to breathe.   Complete by: As directed    Call MD for:  difficulty breathing, headache or visual disturbances   Complete by: As directed    Call MD for:  extreme fatigue   Complete by: As directed    Call MD for:  hives   Complete by: As directed    Call MD for:  persistant dizziness or light-headedness   Complete by: As directed    Call MD for:  persistant nausea and vomiting  Complete by: As directed    Call MD for:  redness, tenderness, or signs of infection (pain, swelling, redness, odor or green/yellow discharge around incision site)   Complete by: As directed    Call MD for:  severe uncontrolled pain   Complete by: As directed    Call MD for:  temperature >100.4   Complete by: As directed    Diet - low sodium heart healthy   Complete by: As directed    Discharge instructions   Complete by: As directed    You were seen in the hospital for pneumonia, Synthia Fairbank COPD exacerbation, and  heart failure.  You've completed Vaun Hyndman course of antibiotics for pneumonia.  Continue the steroid taper for the COPD exacerbation.  Follow with Dr. Chase Caller as an outpatient.  Regarding your heart failure, continue your lasix twice daily at home and watch your weights daily.  If your weights increase by 2-3 lbs in 1 day or by 5 lbs in 1 week, please take an extra dose of lasix and call your PCP or cardiologist for additional instructions.   You're going home with oxygen to use with activity.  Use 2 L with activity, at rest you can use oxygen as needed.  Also bleed in 2 L to your CPAP at night.    You have iron deficiency.  We'll start you on iron.  Follow with your PCP as an outpatient to work this up further.  Return for new, recurrent, or worsening symptoms.  Please ask your PCP to request records from this hospitalization so they know what was done and what the next steps will be.   Increase activity slowly   Complete by: As directed       Allergies as of 08/08/2021       Reactions   Sinequan [doxepin] Other (See Comments)   Kept her awake    Sulfa Antibiotics Swelling        Medication List     TAKE these medications    albuterol 108 (90 Base) MCG/ACT inhaler Commonly known as: VENTOLIN HFA TAKE 2 PUFFS BY MOUTH EVERY 6 HOURS AS NEEDED FOR WHEEZE OR SHORTNESS OF BREATH What changed: See the new instructions.   aspirin EC 81 MG tablet Take 1 tablet (81 mg total) by mouth daily.   atorvastatin 80 MG tablet Commonly known as: LIPITOR Take 1 tablet (80 mg total) by mouth daily.   budesonide 0.5 MG/2ML nebulizer solution Commonly known as: PULMICORT Take 2 mLs (0.5 mg total) by nebulization 2 (two) times daily.   cilostazol 100 MG tablet Commonly known as: PLETAL TAKE 1 TABLET BY MOUTH TWICE Shakemia Madera DAY   clopidogrel 75 MG tablet Commonly known as: PLAVIX TAKE 1 TABLET BY MOUTH EVERY DAY   Dupixent 300 MG/2ML Sopn Generic drug: Dupilumab Inject 2 mLs into the skin  every 14 (fourteen) days.   esomeprazole 40 MG capsule Commonly known as: NexIUM Take 1 capsule (40 mg total) by mouth daily.   ferrous sulfate 325 (65 FE) MG tablet Take 1 tablet (325 mg total) by mouth daily with breakfast. Start taking on: August 09, 2021   fluticasone 50 MCG/ACT nasal spray Commonly known as: FLONASE USE 1 SPRAY INTO EACH NOSTRIL TWICE Kynzley Dowson DAY What changed: See the new instructions.   furosemide 40 MG tablet Commonly known as: LASIX TAKE 1 TABLET BY MOUTH TWICE Jodiann Ognibene DAY What changed: when to take this   guaiFENesin 600 MG 12 hr tablet Commonly known as: MUCINEX Take 600 mg  by mouth daily.   hydrOXYzine 25 MG tablet Commonly known as: ATARAX/VISTARIL Take 25 mg by mouth daily.   ipratropium-albuterol 0.5-2.5 (3) MG/3ML Soln Commonly known as: DUONEB INHALE 3 MLS INTO THE LUNGS EVERY 6 (SIX) HOURS AS NEEDED (SOB, WHEEZING).   Klor-Con M20 20 MEQ tablet Generic drug: potassium chloride SA TAKE 1 TABLET BY MOUTH THREE TIMES Nazaire Cordial DAY What changed: how much to take   levothyroxine 112 MCG tablet Commonly known as: SYNTHROID TAKE 1 TABLET BY MOUTH EVERY DAY IN THE MORNING BEFORE BREAKFAST What changed: See the new instructions.   LORazepam 2 MG tablet Commonly known as: ATIVAN Take 1 tablet (2 mg total) by mouth at bedtime.   montelukast 10 MG tablet Commonly known as: SINGULAIR TAKE 1 TABLET BY MOUTH EVERYDAY AT BEDTIME What changed: See the new instructions.   PARoxetine 20 MG tablet Commonly known as: PAXIL TAKE 1 TABLET BY MOUTH EVERYDAY AT BEDTIME What changed: See the new instructions.   predniSONE 10 MG tablet Commonly known as: DELTASONE Take 4 tablets (40 mg total) by mouth daily with breakfast for 2 days, THEN 3 tablets (30 mg total) daily with breakfast for 2 days, THEN 2 tablets (20 mg total) daily with breakfast for 2 days, THEN 1 tablet (10 mg total) daily with breakfast for 2 days. Start taking on: August 09, 2021   tamsulosin 0.4 MG  Caps capsule Commonly known as: FLOMAX TAKE 1 CAPSULE BY MOUTH EVERY DAY   traZODone 100 MG tablet Commonly known as: DESYREL TAKE 1 TABLET BY MOUTH EVERYDAY AT BEDTIME What changed: See the new instructions.   Trelegy Ellipta 200-62.5-25 MCG/ACT Aepb Generic drug: Fluticasone-Umeclidin-Vilant Inhale 1 puff into the lungs daily.               Durable Medical Equipment  (From admission, onward)           Start     Ordered   08/08/21 1510  For home use only DME oxygen  Once       Comments: Patient requires 2 L with activity   Pt O2 saturation on RA at rest 92% Pt O2 saturation on 2L at rest 93%   Pt O2 saturation on RA with exertion 85% Pt O2 saturation on 2L with exertion 92%  Question Answer Comment  Length of Need Lifetime   Mode or (Route) Nasal cannula   Liters per Minute 2   Frequency Continuous (stationary and portable oxygen unit needed)   Oxygen conserving device Yes   Oxygen delivery system Gas      08/08/21 1510           Allergies  Allergen Reactions   Sinequan [Doxepin] Other (See Comments)    Kept her awake    Sulfa Antibiotics Swelling      The results of significant diagnostics from this hospitalization (including imaging, microbiology, ancillary and laboratory) are listed below for reference.    Significant Diagnostic Studies: CT Angio Chest PE W and/or Wo Contrast  Result Date: 07/31/2021 CLINICAL DATA:  PE suspected, high prob EXAM: CT ANGIOGRAPHY CHEST WITH CONTRAST TECHNIQUE: Multidetector CT imaging of the chest was performed using the standard protocol during bolus administration of intravenous contrast. Multiplanar CT image reconstructions and MIPs were obtained to evaluate the vascular anatomy. CONTRAST:  75mL OMNIPAQUE IOHEXOL 350 MG/ML SOLN COMPARISON:  08/14/2020 FINDINGS: Cardiovascular: Satisfactory opacification of the pulmonary arteries to the segmental level. No evidence of pulmonary embolism. Normal heart size. No  pericardial effusion. Coronary artery  calcification. Thoracic aorta is normal in caliber with mixed plaque. Mediastinum/Nodes: No enlarged lymph nodes.  Hiatal hernia. Lungs/Pleura: Emphysema. Bilateral apical scarring. Postoperative changes right upper lobe. Lingular consolidation. No pleural effusion. No pneumothorax. Upper Abdomen: Dictated separately. Musculoskeletal: Right mastectomy with prosthesis. Degenerative changes of the included spine. Review of the MIP images confirms the above findings. IMPRESSION: No evidence of acute pulmonary embolism. Lingular consolidation suspicious for pneumonia. Emphysema. Coronary and aortic atherosclerosis. Electronically Signed   By: Macy Mis M.D.   On: 07/31/2021 14:46   CT ABDOMEN PELVIS W CONTRAST  Result Date: 07/31/2021 CLINICAL DATA:  Left upper quadrant abdominal pain EXAM: CT ABDOMEN AND PELVIS WITH CONTRAST TECHNIQUE: Multidetector CT imaging of the abdomen and pelvis was performed using the standard protocol following bolus administration of intravenous contrast. CONTRAST:  35mL OMNIPAQUE IOHEXOL 350 MG/ML SOLN COMPARISON:  12/01/2019 FINDINGS: Lower chest: Dictated separately. Hepatobiliary: No focal liver abnormality. Probable punctate layering gallstone or tiny polyp. No biliary dilatation. Pancreas: Unremarkable. Spleen: Unremarkable. Adrenals/Urinary Tract: Adrenals are unremarkable. Kidneys are unremarkable. The bladder is unremarkable with minimal contrast layering dependently. Stomach/Bowel: Moderate hiatal hernia. Stomach is otherwise unremarkable. Bowel is normal in caliber. Vascular/Lymphatic: Extensive atherosclerosis. 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 No enlarged lymph nodes. Reproductive: Status post hysterectomy. No adnexal masses. Other: No free fluid. Small ventral supraumbilical hernias containing fat and small vessels. Musculoskeletal: Lumbar spine degenerative changes.  No acute osseous abnormality. IMPRESSION: No acute abnormality. Moderate hiatal hernia. Punctate layering gallstone or polyp. Extensive atherosclerosis. Additional vascular findings on the prior study are not well evaluated due to contrast phase. Electronically Signed   By: Macy Mis M.D.   On: 07/31/2021 14:34   DG Chest Port 1 View  Result Date: 08/03/2021 CLINICAL DATA:  Acute respiratory distress EXAM: PORTABLE CHEST 1 VIEW COMPARISON:  08/02/2021 FINDINGS: Cardiac and mediastinal silhouette within normal limits. Postsurgical changes in the right upper lung, with surgical clips along the right chest wall. Unchanged lingular airspace disease. Blunting of the right costophrenic angle, possibly Capri Veals trace pleural effusion. No acute osseous abnormality. IMPRESSION: Unchanged lingular consolidation. Possible trace right pleural effusion. Electronically Signed   By: Merilyn Baba M.D.   On: 08/03/2021 00:54   DG CHEST PORT 1 VIEW  Result Date: 08/02/2021 CLINICAL DATA:  CHF. EXAM: PORTABLE CHEST 1 VIEW COMPARISON:  Chest x-ray and chest CT 07/31/2021. FINDINGS: Cardiomediastinal silhouette is within normal limits. Postsurgical changes are again seen in the right upper lung. Surgical clips are noted in the right chest wall, unchanged. Lingular airspace disease appears unchanged from the prior examination. There is no pleural effusion or pneumothorax. No acute fractures are seen. IMPRESSION: 1. Unchanged lingular consolidation. Electronically Signed   By: Ronney Asters M.D.   On: 08/02/2021 15:43   DG Chest Port 1 View  Result Date: 07/31/2021 CLINICAL DATA:  Chest pain, left flank pain EXAM: PORTABLE CHEST 1 VIEW COMPARISON:  Chest radiograph 03/09/2021 FINDINGS: The cardiomediastinal silhouette is grossly stable. There is hazy opacity projecting over the left lower lung with obscuration of the left heart border, new since the prior study. Chain sutures projecting over the right upper lobe and surgical  clips in the right hilar/infrahilar region are unchanged. There is no other focal consolidation. There is no pulmonary edema. There is no pleural effusion or pneumothorax. There is no acute osseous abnormality. IMPRESSION: Hazy opacity in the left lung with obscuration of the left heart border suspicious  for lingular pneumonia. Recommend follow-up radiographs in 6-8 weeks to assess for resolution. Electronically Signed   By: Valetta Mole M.D.   On: 07/31/2021 11:18   ECHOCARDIOGRAM COMPLETE  Result Date: 08/02/2021    ECHOCARDIOGRAM REPORT   Patient Name:   BERTIE MCCONATHY Date of Exam: 08/02/2021 Medical Rec #:  [id number]     Height:       66.0 in Accession #:    [id number]    Weight:       141.1 lb Date of Birth:  23-Dec-1945     BSA:          1.724 m Patient Age:    22 years      BP:           141/72 mmHg Patient Gender: F             HR:           80 bpm. Exam Location:  Inpatient Procedure: 2D Echo, Cardiac Doppler and Color Doppler Indications:    Dyspnea  History:        Patient has prior history of Echocardiogram examinations, most                 recent 05/21/2020. CHF, CAD, PVD, Signs/Symptoms:Dyspnea and                 Pneumonia; Risk Factors:Hypertension, Dyslipidemia and Sleep                 Apnea. Resp. failure.  Sonographer:    Dustin Flock RDCS Referring Phys: [phone] Uel Davidow CALDWELL POWELL JR  Sonographer Comments: Technically challenging study due to limited acoustic windows. Image acquisition challenging due to respiratory motion. Patient has pneumonia IMPRESSIONS  1. Left ventricular ejection fraction, by estimation, is 55 to 60%. The left ventricle has normal function. The left ventricle has no regional wall motion abnormalities. Left ventricular diastolic parameters are consistent with Grade II diastolic dysfunction (pseudonormalization).  2. Right ventricular systolic function is normal. The right ventricular size is normal. Tricuspid regurgitation signal is inadequate for assessing PA  pressure.  3. The mitral valve is normal in structure. No evidence of mitral valve regurgitation. No evidence of mitral stenosis.  4. The aortic valve is normal in structure. Aortic valve regurgitation is not visualized. No aortic stenosis is present.  5. The inferior vena cava is dilated in size with >50% respiratory variability, suggesting right atrial pressure of 8 mmHg. Comparison(s): Compared to prior echo in 05/21/2020, there has been improvement in the Left ventricular ejection fraction was 45-50% now 55-60%. FINDINGS  Left Ventricle: Left ventricular ejection fraction, by estimation, is 55 to 60%. The left ventricle has normal function. The left ventricle has no regional wall motion abnormalities. The left ventricular internal cavity size was normal in size. There is  no left ventricular hypertrophy. Left ventricular diastolic parameters are consistent with Grade II diastolic dysfunction (pseudonormalization). Right Ventricle: The right ventricular size is normal. No increase in right ventricular wall thickness. Right ventricular systolic function is normal. Tricuspid regurgitation signal is inadequate for assessing PA pressure. Left Atrium: Left atrial size was normal in size. Right Atrium: Right atrial size was normal in size. Pericardium: There is no evidence of pericardial effusion. Mitral Valve: The mitral valve is normal in structure. Mild to moderate mitral annular calcification. No evidence of mitral valve regurgitation. No evidence of mitral valve stenosis. Tricuspid Valve: The tricuspid valve is normal in structure. Tricuspid valve regurgitation is not demonstrated. No  evidence of tricuspid stenosis. Aortic Valve: The aortic valve is normal in structure. Aortic valve regurgitation is not visualized. No aortic stenosis is present. Pulmonic Valve: The pulmonic valve was normal in structure. Pulmonic valve regurgitation is not visualized. No evidence of pulmonic stenosis. Aorta: The aortic root is  normal in size and structure. Venous: The inferior vena cava is dilated in size with greater than 50% respiratory variability, suggesting right atrial pressure of 8 mmHg. IAS/Shunts: No atrial level shunt detected by color flow Doppler.  LEFT VENTRICLE PLAX 2D LVIDd:         4.40 cm   Diastology LVIDs:         2.40 cm   LV e' medial:    4.35 cm/s LV PW:         1.00 cm   LV E/e' medial:  21.7 LV IVS:        1.00 cm   LV e' lateral:   7.18 cm/s LVOT diam:     2.10 cm   LV E/e' lateral: 13.1 LV SV:         65 LV SV Index:   38 LVOT Area:     3.46 cm  RIGHT VENTRICLE RV Basal diam:  3.00 cm RV S prime:     9.46 cm/s TAPSE (M-mode): 1.8 cm LEFT ATRIUM             Index        RIGHT ATRIUM           Index LA diam:        4.60 cm 2.67 cm/m   RA Area:     11.80 cm LA Vol (A2C):   52.3 ml 30.33 ml/m  RA Volume:   26.20 ml  15.20 ml/m LA Vol (A4C):   24.8 ml 14.38 ml/m LA Biplane Vol: 37.7 ml 21.87 ml/m  AORTIC VALVE LVOT Vmax:   87.90 cm/s LVOT Vmean:  55.100 cm/s LVOT VTI:    0.188 m  AORTA Ao Root diam: 2.90 cm MITRAL VALVE MV Area (PHT): 4.93 cm    SHUNTS MV Decel Time: 154 msec    Systemic VTI:  0.19 m MV E velocity: 94.30 cm/s  Systemic Diam: 2.10 cm MV Alvia Tory velocity: 85.30 cm/s MV E/Gillian Kluever ratio:  1.11 Kardie Tobb DO Electronically signed by Berniece Salines DO Signature Date/Time: 08/02/2021/6:11:22 PM    Final     Microbiology: Recent Results (from the past 240 hour(s))  Respiratory (~20 pathogens) panel by PCR     Status: None   Collection Time: 07/31/21 11:07 AM   Specimen: Nasopharyngeal Swab; Respiratory  Result Value Ref Range Status   Adenovirus NOT DETECTED NOT DETECTED Final   Coronavirus 229E NOT DETECTED NOT DETECTED Final    Comment: (NOTE) The Coronavirus on the Respiratory Panel, DOES NOT test for the novel  Coronavirus (2019 nCoV)    Coronavirus HKU1 NOT DETECTED NOT DETECTED Final   Coronavirus NL63 NOT DETECTED NOT DETECTED Final   Coronavirus OC43 NOT DETECTED NOT DETECTED Final    Metapneumovirus NOT DETECTED NOT DETECTED Final   Rhinovirus / Enterovirus NOT DETECTED NOT DETECTED Final   Influenza Yuchen Fedor NOT DETECTED NOT DETECTED Final   Influenza B NOT DETECTED NOT DETECTED Final   Parainfluenza Virus 1 NOT DETECTED NOT DETECTED Final   Parainfluenza Virus 2 NOT DETECTED NOT DETECTED Final   Parainfluenza Virus 3 NOT DETECTED NOT DETECTED Final   Parainfluenza Virus 4 NOT DETECTED NOT DETECTED Final   Respiratory Syncytial  Virus NOT DETECTED NOT DETECTED Final   Bordetella pertussis NOT DETECTED NOT DETECTED Final   Bordetella Parapertussis NOT DETECTED NOT DETECTED Final   Chlamydophila pneumoniae NOT DETECTED NOT DETECTED Final   Mycoplasma pneumoniae NOT DETECTED NOT DETECTED Final    Comment: Performed at [facility], [place] [address], [place], [place] [address]  Resp Panel by RT-PCR (Flu Gaberial Cada&B, Covid) Nasopharyngeal Swab     Status: None   Collection Time: 07/31/21  3:27 PM   Specimen: Nasopharyngeal Swab; Nasopharyngeal(NP) swabs in vial transport medium  Result Value Ref Range Status   SARS Coronavirus 2 by RT PCR NEGATIVE NEGATIVE Final    Comment: (NOTE) SARS-CoV-2 target nucleic acids are NOT DETECTED.  The SARS-CoV-2 RNA is generally detectable in upper respiratory specimens during the acute phase of infection. The lowest concentration of SARS-CoV-2 viral copies this assay can detect is 138 copies/mL. Raniya Golembeski negative result does not preclude SARS-Cov-2 infection and should not be used as the sole basis for treatment or other patient management decisions. Permelia Bamba negative result may occur with  improper specimen collection/handling, submission of specimen other than nasopharyngeal swab, presence of viral mutation(s) within the areas targeted by this assay, and inadequate number of viral copies(<138 copies/mL). Brixton Schnapp negative result must be combined with clinical observations, patient history, and epidemiological information. The expected result is Negative.  Fact  Sheet for Patients:  EntrepreneurPulse.com.au  Fact Sheet for Healthcare Providers:  IncredibleEmployment.be  This test is no t yet approved or cleared by the [place] FDA and  has been authorized for detection and/or diagnosis of SARS-CoV-2 by FDA under an Emergency Use Authorization (EUA). This EUA will remain  in effect (meaning this test can be used) for the duration of the COVID-19 declaration under Section 564(b)(1) of the Act, 21 U.S.C.section 360bbb-3(b)(1), unless the authorization is terminated  or revoked sooner.       Influenza Jeannie Mallinger by PCR NEGATIVE NEGATIVE Final   Influenza B by PCR NEGATIVE NEGATIVE Final    Comment: (NOTE) The Xpert Xpress SARS-CoV-2/FLU/RSV plus assay is intended as an aid in the diagnosis of influenza from Nasopharyngeal swab specimens and should not be used as Jonathon Tan sole basis for treatment. Nasal washings and aspirates are unacceptable for Xpert Xpress SARS-CoV-2/FLU/RSV testing.  Fact Sheet for Patients: EntrepreneurPulse.com.au  Fact Sheet for Healthcare Providers: IncredibleEmployment.be  This test is not yet approved or cleared by the [place] FDA and has been authorized for detection and/or diagnosis of SARS-CoV-2 by FDA under an Emergency Use Authorization (EUA). This EUA will remain in effect (meaning this test can be used) for the duration of the COVID-19 declaration under Section 564(b)(1) of the Act, 21 U.S.C. section 360bbb-3(b)(1), unless the authorization is terminated or revoked.  Performed at [facility], [place] [address] [place], [place] [address]   Blood culture (routine x 2)     Status: None   Collection Time: 07/31/21  5:27 PM   Specimen: BLOOD LEFT FOREARM  Result Value Ref Range Status   Specimen Description   Final    BLOOD LEFT FOREARM Performed at [place] [address] [place], [place]  [address]    Special Requests   Final    BOTTLES DRAWN AEROBIC AND ANAEROBIC Blood Culture results may not be optimal due to an inadequate volume of blood received in culture bottles Performed at [place] [address] [place],  [address]    Culture   Final  NO GROWTH 5 DAYS Performed at [facility], [place] [address] [place], [place] [address]    Report Status 08/05/2021 FINAL  Final  Blood culture (routine x 2)     Status: None   Collection Time: 07/31/21  7:52 PM   Specimen: BLOOD LEFT WRIST  Result Value Ref Range Status   Specimen Description   Final    BLOOD LEFT WRIST Performed at [place] [address], [place], [place] [address]    Special Requests   Final    Blood Culture results may not be optimal due to an inadequate volume of blood received in culture bottles BOTTLES DRAWN AEROBIC ONLY Performed at [facility], [place] [address] [place], [place] [address]    Culture   Final    NO GROWTH 5 DAYS Performed at [facility], [place] [address] [place], [place] [address]    Report Status 08/05/2021 FINAL  Final  MRSA Next Gen by PCR, Nasal     Status: None   Collection Time: 08/03/21  9:58 AM   Specimen: Nasal Mucosa; Nasal Swab  Result Value Ref Range Status   MRSA by PCR Next Gen NOT DETECTED NOT DETECTED Final    Comment: (NOTE) The GeneXpert MRSA Assay (FDA approved for NASAL specimens only), is one component of Alantis Bethune comprehensive MRSA colonization surveillance program. It is not intended to diagnose MRSA infection nor to guide or monitor treatment for MRSA infections. Test performance is not FDA approved in patients less than 77 years old. Performed at [facility], [place] [address], [place], [place] [address]      Labs: Basic Metabolic Panel: Recent Labs  Lab 08/04/21 0238 08/04/21 1441 08/05/21 0251 08/06/21 0438 08/07/21 0441 08/08/21 0520  NA 138 136 138 137 137 135   K 2.6* 3.7 3.2* 3.1* 4.3 3.7  CL 102 97* 98 98 102 99  CO2 30 28 33* 34* 30 29  GLUCOSE 122* 185* 133* 93 99 85  BUN 14 20 12 13 18 19   CREATININE 0.75 0.72 0.63 0.60 0.75 0.76  CALCIUM 8.3* 8.6* 8.6* 8.5* 8.4* 8.4*  MG 1.9  --  2.0 2.0 2.2 2.1  PHOS 3.3  --  3.1 2.5 3.0 3.7   Liver Function Tests: Recent Labs  Lab 08/03/21 0544 08/04/21 0238 08/05/21 0251 08/07/21 0441  AST 50* 30 21 19   ALT 50* 39 33 26  ALKPHOS 89 78 73 64  BILITOT 0.5 0.4 0.4 0.3  PROT 6.7 6.4* 6.5 6.5  ALBUMIN 3.1* 2.9* 3.1* 3.2*   No results for input(s): LIPASE, AMYLASE in the last 168 hours. No results for input(s): AMMONIA in the last 168 hours. CBC: Recent Labs  Lab 08/03/21 0544 08/04/21 0238 08/05/21 0251 08/06/21 0438 08/07/21 0441 08/08/21 0520  WBC 8.0 6.5 5.5 5.6 6.4 6.0  NEUTROABS 6.9 5.6 4.4  --  4.1  --   HGB 10.1* 10.3* 10.6* 10.9* 11.5* 12.2  HCT 30.5* 31.8* 31.6* 33.4* 36.3 37.0  MCV 81.8 81.1 79.2* 80.9 81.8 80.3  PLT 446* 462* 466* 483* 513* 550*   Cardiac Enzymes: No results for input(s): CKTOTAL, CKMB, CKMBINDEX, TROPONINI in the last 168 hours. BNP: BNP (last 3 results) Recent Labs    08/06/21 0438 08/07/21 0441 08/08/21 0520  BNP 417.5* 159.7* 121.3*    ProBNP (last 3 results) No results for input(s): PROBNP in the last 8760 hours.  CBG: No results for input(s): GLUCAP in the last  168 hours.     Signed:  Fayrene Helper MD.  [facility] 08/08/2021, 3:24 PM

## 2021-08-08 NOTE — Progress Notes (Signed)
Pt O2 saturation on RA at rest 92% Pt O2 saturation on 2L at rest 93%  Pt O2 saturation on RA with exertion 85% Pt O2 saturation on 2L with exertion 92%

## 2021-08-08 NOTE — Plan of Care (Signed)
Plan of care and discharge instructions reviewed with pt. Pt and husband at bedside verbalize understanding and cooperation with discharge orders. Pt discharge home with self care. Home Health Refused. Pt educated and continues to refuse home health care. Pt discharge home with 2 L [place] walk test completed. Pt educated about medications and follow up appointments. Pt educated about when to call the doctor for signs and symptoms of  complications. Pt verbalizes understanding and cooperation. Pt husband to provide transport at discharge.  Problem: Education: Goal: Knowledge of General Education information will improve Description: Including pain rating scale, medication(s)/side effects and non-pharmacologic comfort measures Outcome: Adequate for Discharge   Problem: Health Behavior/Discharge Planning: Goal: Ability to manage health-related needs will improve Outcome: Adequate for Discharge   Problem: Clinical Measurements: Goal: Ability to maintain clinical measurements within normal limits will improve Outcome: Adequate for Discharge Goal: Will remain free from infection Outcome: Adequate for Discharge Goal: Diagnostic test results will improve Outcome: Adequate for Discharge Goal: Respiratory complications will improve Outcome: Adequate for Discharge Goal: Cardiovascular complication will be avoided Outcome: Adequate for Discharge   Problem: Activity: Goal: Risk for activity intolerance will decrease Outcome: Adequate for Discharge   Problem: Nutrition: Goal: Adequate nutrition will be maintained Outcome: Adequate for Discharge   Problem: Coping: Goal: Level of anxiety will decrease Outcome: Adequate for Discharge   Problem: Elimination: Goal: Will not experience complications related to bowel motility Outcome: Adequate for Discharge Goal: Will not experience complications related to urinary retention Outcome: Adequate for Discharge   Problem: Pain Managment: Goal: General  experience of comfort will improve Outcome: Adequate for Discharge   Problem: Safety: Goal: Ability to remain free from injury will improve Outcome: Adequate for Discharge   Problem: Skin Integrity: Goal: Risk for impaired skin integrity will decrease Outcome: Adequate for Discharge

## 2021-08-10 ENCOUNTER — Telehealth: Payer: Self-pay

## 2021-08-10 NOTE — Telephone Encounter (Signed)
Transition Care Management Unsuccessful Follow-up Telephone Call  Date of discharge and from where:  [name]  08/08/2021  Attempts:  1st Attempt  Reason for unsuccessful TCM follow-up call:  Unable to leave message

## 2021-08-11 NOTE — Telephone Encounter (Signed)
Transition Care Management Unsuccessful Follow-up Telephone Call  Date of discharge and from where:  [facility] 08/08/2021  Attempts:  2nd Attempt/  Reason for unsuccessful TCM follow-up call:  Unable to leave message

## 2021-08-17 ENCOUNTER — Other Ambulatory Visit: Payer: Self-pay

## 2021-08-18 ENCOUNTER — Ambulatory Visit (INDEPENDENT_AMBULATORY_CARE_PROVIDER_SITE_OTHER): Payer: Medicare Other | Admitting: Family Medicine

## 2021-08-18 ENCOUNTER — Encounter: Payer: Self-pay | Admitting: Family Medicine

## 2021-08-18 ENCOUNTER — Ambulatory Visit (INDEPENDENT_AMBULATORY_CARE_PROVIDER_SITE_OTHER): Payer: Medicare Other

## 2021-08-18 VITALS — BP 120/70 | HR 86 | Resp 16 | Ht 66.0 in | Wt 127.0 lb

## 2021-08-18 DIAGNOSIS — I10 Essential (primary) hypertension: Secondary | ICD-10-CM | POA: Diagnosis not present

## 2021-08-18 DIAGNOSIS — F419 Anxiety disorder, unspecified: Secondary | ICD-10-CM

## 2021-08-18 DIAGNOSIS — J441 Chronic obstructive pulmonary disease with (acute) exacerbation: Secondary | ICD-10-CM | POA: Diagnosis not present

## 2021-08-18 DIAGNOSIS — J189 Pneumonia, unspecified organism: Secondary | ICD-10-CM

## 2021-08-18 DIAGNOSIS — I739 Peripheral vascular disease, unspecified: Secondary | ICD-10-CM

## 2021-08-18 DIAGNOSIS — I5020 Unspecified systolic (congestive) heart failure: Secondary | ICD-10-CM

## 2021-08-18 DIAGNOSIS — D509 Iron deficiency anemia, unspecified: Secondary | ICD-10-CM

## 2021-08-18 LAB — COMPREHENSIVE METABOLIC PANEL
ALT: 20 U/L (ref 0–35)
AST: 22 U/L (ref 0–37)
Albumin: 4.1 g/dL (ref 3.5–5.2)
Alkaline Phosphatase: 80 U/L (ref 39–117)
BUN: 20 mg/dL (ref 6–23)
CO2: 29 mEq/L (ref 19–32)
Calcium: 9 mg/dL (ref 8.4–10.5)
Chloride: 99 mEq/L (ref 96–112)
Creatinine, Ser: 1.09 mg/dL (ref 0.40–1.20)
GFR: 49.26 mL/min — ABNORMAL LOW (ref >60.00)
Glucose, Bld: 74 mg/dL (ref 70–99)
Potassium: 3.6 mEq/L (ref 3.5–5.1)
Sodium: 137 mEq/L (ref 135–145)
Total Bilirubin: 0.5 mg/dL (ref 0.2–1.2)
Total Protein: 7.2 g/dL (ref 6.0–8.3)

## 2021-08-18 LAB — CBC
HCT: 37.8 % (ref 36.0–46.0)
Hemoglobin: 12.4 g/dL (ref 12.0–15.0)
MCHC: 32.9 g/dL (ref 30.0–36.0)
MCV: 81.3 fl (ref 78.0–100.0)
Platelets: 505 10*3/uL — ABNORMAL HIGH (ref 150.0–400.0)
RBC: 4.65 Mil/uL (ref 3.87–5.11)
RDW: 17.2 % — ABNORMAL HIGH (ref 11.5–15.5)
WBC: 7.9 10*3/uL (ref 4.0–10.5)

## 2021-08-18 NOTE — Progress Notes (Signed)
HPI: [name] is a 75 y.o. female with hx of hypertension, Gold stage III COPD, anxiety,chronic hypoxic respiratory failure on supplemental O2, hyperlipidemia, PAD,, and HFrEF here today to follow on recent hospitalization.   Was last seen here in the office on 07/19/2021. Hospitalized from 07/31/2021 to 08/08/2021.  TCM call on 08/10/21 and 08/11/21 unscheduled attempts..  She presented to the ED the date of admission complaining of left-sided chest pain.  Chest CTA revealed lingular consolidation suspicious for pneumonia.  Coronary and aortic atherosclerosis, emphysema, and no evidence of PE.  CXR 08/03/21: Unchanged lingular consolidation. Possible trace right pleural effusion.  Dx'ed with COPD exacerbation with community-acquired pneumonia. Initial CBC showed leukocytosis. Lactic acid elevated at 3.  Bcx no growth in 5 days. Final on 08/05/21. MRSA not detected. Urine strep negative, urine legionella negative RVP negative.  Negative influenza, covid.  Discharged on ceftriaxone and azithromycin as well as oral prednisone, she has completed abx and prednisone taper treatment. Well tolerated. Negative for changes in bowel habits, nausea, or vomiting. Chronic diarrhea, has been going on for 2 years, she takes OTC "antidiarrhea" medication.  Occasional cough, no hemoptysis. DOE and no wheezing. She feels good, better than her baseline.  She denies using O2 supplementation during the day,2 LPM of supplemental O2 at night. She has been following with pulmonologist, Dr [name] but according to pt, she was told there is nothing else that can be done to improve lung function, so no further follow up was recommended. She is on Trelegy Ellipta 200-62.5-25 1 puff daily , Pulmicort bid, and Duoneb qid prn. OSA following with Dr [name].  Hospitalization was complicated with HF exacerbation, she was transferred to [facility]. BNP 1,051 on 08/03/21 and down to 121 at the time of her  discharge. Negative for CP,palpitations,orthopnea, PND,and edema. Echo 08/02/21: LVEF 55-60% and grade II diastolic dysfunction.  She is on Furosemide 40 mg bid. HypoK+ on KLOR 20 meq tid. PAD:  Stated that LE's achy pain have resolved after starting [name] for atopic dermatitis. She is on Plavix 75 mg daily,Pletal 100 mg bid,and Atorvastatin 80 mg daily.  Lab Results  Component Value Date   CREATININE 0.76 08/08/2021   BUN 19 08/08/2021   NA 135 08/08/2021   K 3.7 08/08/2021   CL 99 08/08/2021   CO2 29 08/08/2021   Normocytic anemia: She was started on Iron supplementation. She has not noted melena,blood in stool,or gross hematuria. Appt with hematologist/oncologist on 11/30/20.  Lab Results  Component Value Date   WBC 6.0 08/08/2021   HGB 12.2 08/08/2021   HCT 37.0 08/08/2021   MCV 80.3 08/08/2021   PLT 550 (H) 08/08/2021   Anxiety and insomnia on Trazodone 100 mg at bedtime, Paroxetine 20 mg daily,and Lorazepam 2 mg at night.  Review of Systems  Constitutional:  Positive for fatigue. Negative for chills and fever.  HENT:  Negative for mouth sores, nosebleeds and sore throat.   Eyes:  Negative for redness and visual disturbance.  Gastrointestinal:  Negative for abdominal pain.  Genitourinary:  Negative for decreased urine volume and dysuria.  Neurological:  Negative for syncope, weakness and headaches.  Psychiatric/Behavioral:  Positive for sleep disturbance. Negative for confusion. The patient is nervous/anxious.   Rest see pertinent positives and negatives per HPI.  Current Outpatient Medications on File Prior to Visit  Medication Sig Dispense Refill   albuterol (VENTOLIN HFA) 108 (90 Base) MCG/ACT inhaler TAKE 2 PUFFS BY MOUTH EVERY 6 HOURS AS NEEDED FOR WHEEZE OR  SHORTNESS OF BREATH (Patient taking differently: Inhale 1 puff into the lungs every 4 (four) hours as needed for wheezing or shortness of breath.) 8.5 each 3   aspirin EC 81 MG tablet Take 1 tablet (81 mg  total) by mouth daily. 30 tablet    atorvastatin (LIPITOR) 80 MG tablet Take 1 tablet (80 mg total) by mouth daily. 90 tablet 2   budesonide (PULMICORT) 0.5 MG/2ML nebulizer solution Take 2 mLs (0.5 mg total) by nebulization 2 (two) times daily. 20 mL 12   cilostazol (PLETAL) 100 MG tablet TAKE 1 TABLET BY MOUTH TWICE A DAY (Patient taking differently: Take 100 mg by mouth 2 (two) times daily.) 180 tablet 3   clopidogrel (PLAVIX) 75 MG tablet TAKE 1 TABLET BY MOUTH EVERY DAY (Patient taking differently: Take 75 mg by mouth daily.) 90 tablet 3   DUPIXENT 300 MG/2ML SOPN Inject 2 mLs into the skin every 14 (fourteen) days.     esomeprazole (NEXIUM) 40 MG capsule Take 1 capsule (40 mg total) by mouth daily. 90 capsule 1   ferrous sulfate 325 (65 FE) MG tablet Take 1 tablet (325 mg total) by mouth daily with breakfast. 30 tablet 0   fluticasone (FLONASE) 50 MCG/ACT nasal spray USE 1 SPRAY INTO EACH NOSTRIL TWICE A DAY (Patient taking differently: Place 1 spray into both nostrils 2 (two) times daily.) 48 mL 1   Fluticasone-Umeclidin-Vilant (TRELEGY ELLIPTA) 200-62.5-25 MCG/INH AEPB Inhale 1 puff into the lungs daily. 60 each 3   furosemide (LASIX) 40 MG tablet TAKE 1 TABLET BY MOUTH TWICE A DAY (Patient taking differently: Take 40 mg by mouth 2 (two) times daily.) 180 tablet 1   guaiFENesin (MUCINEX) 600 MG 12 hr tablet Take 600 mg by mouth daily.     hydrOXYzine (ATARAX/VISTARIL) 25 MG tablet Take 25 mg by mouth daily.     ipratropium-albuterol (DUONEB) 0.5-2.5 (3) MG/3ML SOLN INHALE 3 MLS INTO THE LUNGS EVERY 6 (SIX) HOURS AS NEEDED (SOB, WHEEZING). 360 mL 4   KLOR-CON M20 20 MEQ tablet TAKE 1 TABLET BY MOUTH THREE TIMES A DAY (Patient taking differently: Take 20 mEq by mouth 3 (three) times daily.) 270 tablet 1   levothyroxine (SYNTHROID) 112 MCG tablet TAKE 1 TABLET BY MOUTH EVERY DAY IN THE MORNING BEFORE BREAKFAST (Patient taking differently: Take 112 mcg by mouth daily before breakfast.) 90 tablet 2    LORazepam (ATIVAN) 2 MG tablet Take 1 tablet (2 mg total) by mouth at bedtime. 30 tablet 3   montelukast (SINGULAIR) 10 MG tablet TAKE 1 TABLET BY MOUTH EVERYDAY AT BEDTIME (Patient taking differently: Take 10 mg by mouth at bedtime.) 90 tablet 1   PARoxetine (PAXIL) 20 MG tablet TAKE 1 TABLET BY MOUTH EVERYDAY AT BEDTIME (Patient taking differently: Take 20 mg by mouth daily.) 90 tablet 1   tamsulosin (FLOMAX) 0.4 MG CAPS capsule TAKE 1 CAPSULE BY MOUTH EVERY DAY (Patient taking differently: Take 0.4 mg by mouth daily.) 90 capsule 0   traZODone (DESYREL) 100 MG tablet TAKE 1 TABLET BY MOUTH EVERYDAY AT BEDTIME (Patient taking differently: Take 100 mg by mouth at bedtime.) 90 tablet 1   No current facility-administered medications on file prior to visit.    Past Medical History:  Diagnosis Date   Bilateral leg cramps    Bladder neoplasm    Cancer (HCC)    CHF (congestive heart failure) (HCC)    Chronic deep vein thrombosis (DVT) of right lower extremity ([place])    05/ 2018  chronic non-occlusive DVT RLE   COPD, frequent exacerbations ([place])    03-06-2018 per pt last exacerbation 12/ 2018   Coronary artery disease    cardiologist-  dr Aundra Dubin-- 03-11-2018 er cath , 80% stenosis in the ostial second diagonal   Dyspnea on minimal exertion    Emphysema/COPD ([place])    CAT score- 17   Family history of breast cancer    Family history of colon cancer    Family history of melanoma    Family history of ovarian cancer    Family history of pancreatic cancer    Fibromyalgia 1995   GERD (gastroesophageal reflux disease)    Hiatal hernia    History of breast cancer    History of diverticulitis of colon    2002  s/p  sigmoid colectomy   History of multiple pulmonary nodules    hx RUL nodules x2  s/p right VATS w/ wedge resection 09-11-2005 and 06-14-2011  both  necrotizing granulomatous inflammation w/ cystic area of necrosis & focal calcification   History of right breast cancer oncologist-  dr  Jana Hakim-- no recurrence   dx 2010--  IDC, Stage IA , ER/PR+,  (pT1c pN0) 11-09-2008 right lumpectomy;  12-18-2008 right simple mastectomy for DCIS margins;  completed chemotherapy 2010 (no radiation) and completed antiestrogen therapy   Hx of colonic polyps    Dr. 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   Nocturia    OA (osteoarthritis)    thumbs   On supplemental oxygen therapy    03-06-2018 per pt uses only at night,  checks O2 sats at home,  stated am sat 89% after moving around average 93-94% with RA   Osteoporosis    Peripheral arterial occlusive disease ([place]) vascular-- dr chen/ dr Donzetta Matters   proximal right SFA severe focal stenosis with collaterals from the PFA/  04/ 2012 occluded celiac and SMA arteries with distal reconstitution w/ patent IMA   Peripheral vascular disease ([place])    chronic DVT RLE,  mesenteric vascular disease   Right lower lobe pulmonary nodule    Chest CT 08-29-2017   Stage 3 severe COPD by GOLD classification ([place]) hx frequent exacerbations--   pulmologist-  dr Maryann Alar--  per lov note , dated 12-20-2017, oxyogen 2L is prescribed for use with exertion (but pt only uses mostly at night), O2 sats on RA run the 80s , this day sat 86% RA and with 2L O2 sat 92%   Varicose veins of leg with swelling    varicose vein surgery - Dr. Aleda Grana   Wears glasses    Wears hearing aid in both ears    Allergies  Allergen Reactions   Sinequan [Doxepin] Other (See Comments)    Kept her awake    Sulfa Antibiotics Swelling    Social History   Socioeconomic History   Marital status: Married    Spouse name: Not on file   Number of children: 3   Years of education: 12   Highest education level: Not on file  Occupational History   Occupation: beautician    Employer: RETIRED    Comment: retired  Tobacco Use   Smoking status: Former    Packs/day: 0.75     Years: 50.00    Pack years: 37.50    Types: Cigarettes    Quit date: 05/18/2016    Years since quitting: 5.2  Smokeless tobacco: Never  Vaping Use   Vaping Use: Never used  Substance and Sexual Activity   Alcohol use: Yes    Alcohol/week: 10.0 standard drinks    Types: 10 Cans of beer per week   Drug use: No   Sexual activity: Not on file  Other Topics Concern   Not on file  Social History Narrative   HSG, beauty school. Married '69 -46 yrs/widowed; married '79 - 30yr/divorced; married '87.  2 sons - '70, '74, 1 srtep-son; 1 grandchild. 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 SO - good health.  NO history of abuse.  ACP - full code and all heroic measures.    Social Determinants of Health   Financial Resource Strain: Low Risk    Difficulty of Paying Living Expenses: Not hard at all  Food Insecurity: No Food Insecurity   Worried About Charity fundraiser in the Last Year: Never true   [place] in the Last Year: Never true  Transportation Needs: No Transportation Needs   Lack of Transportation (Medical): No   Lack of Transportation (Non-Medical): No  Physical Activity: Inactive   Days of Exercise per Week: 0 days   Minutes of Exercise per Session: 0 min  Stress: No Stress Concern Present   Feeling of Stress : Not at all  Social Connections: Socially Isolated   Frequency of Communication with Friends and Family: Once a week   Frequency of Social Gatherings with Friends and Family: Never   Attends Religious Services: Never   Marine scientist or Organizations: No   Attends Archivist Meetings: Never   Marital Status: Married   Vitals:   08/18/21 1345  BP: 120/70  Pulse: 86  Resp: 16  SpO2: 93%   Body mass index is 20.5 kg/m.  Physical Exam Vitals and nursing note reviewed.  Constitutional:      General: She is not in acute distress.    Appearance: She is well-developed.  HENT:     Head: Normocephalic and atraumatic.     Mouth/Throat:     Mouth:  Mucous membranes are moist.     Pharynx: Oropharynx is clear.  Eyes:     Conjunctiva/sclera: Conjunctivae normal.  Cardiovascular:     Rate and Rhythm: Normal rate and regular rhythm.     Heart sounds: No murmur heard. Pulmonary:     Effort: Pulmonary effort is normal. No respiratory distress.     Breath sounds: Normal breath sounds.  Abdominal:     Palpations: Abdomen is soft. There is no hepatomegaly or mass.     Tenderness: There is no abdominal tenderness.  Skin:    General: Skin is warm.     Findings: Rash (Mild, erythematous,scaly areas but greatly improved.) present. No erythema.  Neurological:     General: No focal deficit present.     Mental Status: She is alert and oriented to person, place, and time.     Cranial Nerves: No cranial nerve deficit.     Comments: Stable gait, not assisted.  Psychiatric:     Comments: Well groomed, good eye contact.   ASSESSMENT AND PLAN:  [name] was seen today for hospitalization follow-up.  Diagnoses and all orders for this visit: Orders Placed This Encounter  Procedures   DG Chest 2 View   Comprehensive metabolic panel   CBC   Lab Results  Component Value Date   WBC 7.9 08/18/2021   HGB 12.4 08/18/2021   HCT 37.8 08/18/2021  MCV 81.3 08/18/2021   PLT 505.0 (H) 08/18/2021   Lab Results  Component Value Date   ALT 20 08/18/2021   AST 22 08/18/2021   ALKPHOS 80 08/18/2021   BILITOT 0.5 08/18/2021   Lab Results  Component Value Date   CREATININE 1.09 08/18/2021   BUN 20 08/18/2021   NA 137 08/18/2021   K 3.6 08/18/2021   CL 99 08/18/2021   CO2 29 08/18/2021   Community acquired pneumonia of left lung, unspecified part of lung Symptoms have resolved. Completed abx treatment. CXR ordered today.  COPD, frequent exacerbations ([place]) Symptoms have improved. Continue Duoneb,Pulmicort, and Trelegy ellipta 200-62.5-25 mcg.  HFrEF (heart failure with reduced ejection fraction) (HCC) Euvolemic at this time. Continue  Furosemide 40 mg bid and low salt diet. Following with cardiologist.  Essential hypertension BP adequately controlled. Continue non pharmacologic treatment.  Anxiety disorder, unspecified type Stable. Continue Paroxetine 20 mg and Lorazepam 2 mg daily.  PAD (peripheral artery disease) ([place]) Reporting no claudication. Continue Plavix,Pletal,and Atorvastatin.  Iron deficiency anemia, unspecified iron deficiency anemia type Continue Fe sulfate 325 mg daily. Further recommendations according to CBC result.  Return if symptoms worsen or fail to improve, for Keep next appt in 12/2021.  [name] [place]. [place], MD  [facility]. [place] office.

## 2021-08-18 NOTE — Patient Instructions (Addendum)
A few things to remember from today's visit:  Essential hypertension - Plan: Comprehensive metabolic panel, CBC  COPD, frequent exacerbations ([place]) - Plan: CBC  Anxiety disorder, unspecified type  HFrEF (heart failure with reduced ejection fraction) ([place])  If you need refills please call your pharmacy. Do not use My Chart to request refills or for acute issues that need immediate attention.   No changes today. Continue daily activities as tolerated. Keep appt with cardiologist. I see you back in March 20023.  Please be sure medication list is accurate. If a new problem present, please set up appointment sooner than planned today.

## 2021-08-23 ENCOUNTER — Other Ambulatory Visit: Payer: Self-pay | Admitting: Family Medicine

## 2021-08-30 ENCOUNTER — Telehealth: Payer: Self-pay | Admitting: Family Medicine

## 2021-08-30 DIAGNOSIS — N289 Disorder of kidney and ureter, unspecified: Secondary | ICD-10-CM

## 2021-08-30 NOTE — Telephone Encounter (Signed)
Ally a nurse with [facility] [place] [facility] called to leave a message because she spoke with patient and patient has stated she has had a seven pound weight gain since Monday and is having shortness of breath. Patient took extra fluid pill as directed but did has not went to the restroom since taking it this morning. Patient noticed that she has not been going to the restroom a lot lately but has still been thirsty and having dry mouth.Patient has not reestablished with cardiology since her last doctor retired, but the last time she was discharged from hospital, kidney function was low. Radonna Ricker is wondering if there could be a referral placed to a kidney doctor for patient     Good callback number is [phone] ext [phone]  Please advise

## 2021-08-30 NOTE — Telephone Encounter (Signed)
Do you want to see patient or okay to place nephrology referral?

## 2021-09-01 ENCOUNTER — Telehealth: Payer: Self-pay

## 2021-09-01 NOTE — Telephone Encounter (Signed)
Last e GFR was mildly elevated, so I do not think she needs to see nephrologist at this time but if she would like to do so,it is ok to place referral. If she is not urinating and/or SOB is worsening she needs to go to the ED, so renal function and BNP can be re-checked. Thanks, BJ

## 2021-09-01 NOTE — Telephone Encounter (Signed)
Pt is aware referral to nephrology has been place and also per sarah if she still having issues going to bathroom please go to ED

## 2021-09-01 NOTE — Telephone Encounter (Signed)
This referral has already been placed.

## 2021-09-01 NOTE — Telephone Encounter (Signed)
I tried contacting patient, unable to leave a message. Will try again, will go ahead and place referral to nephrology.

## 2021-09-01 NOTE — Telephone Encounter (Signed)
[facility] nurse called stating patient is having recurrent  kidney issues and would like a referral to an nephrologist  Call back # [phone] ext [phone] Ebony Hail

## 2021-09-05 ENCOUNTER — Other Ambulatory Visit: Payer: Self-pay | Admitting: Family Medicine

## 2021-09-05 ENCOUNTER — Other Ambulatory Visit: Payer: Self-pay

## 2021-09-05 ENCOUNTER — Encounter (HOSPITAL_COMMUNITY): Payer: Self-pay | Admitting: Emergency Medicine

## 2021-09-05 ENCOUNTER — Emergency Department (HOSPITAL_COMMUNITY)
Admission: EM | Admit: 2021-09-05 | Discharge: 2021-09-05 | Disposition: A | Discharge status: Home / Self Care | Payer: Medicare Other | Attending: Emergency Medicine | Admitting: Emergency Medicine

## 2021-09-05 ENCOUNTER — Emergency Department (HOSPITAL_COMMUNITY): Payer: Medicare Other

## 2021-09-05 DIAGNOSIS — Z853 Personal history of malignant neoplasm of breast: Secondary | ICD-10-CM | POA: Diagnosis not present

## 2021-09-05 DIAGNOSIS — Z79899 Other long term (current) drug therapy: Secondary | ICD-10-CM | POA: Insufficient documentation

## 2021-09-05 DIAGNOSIS — I2510 Atherosclerotic heart disease of native coronary artery without angina pectoris: Secondary | ICD-10-CM | POA: Insufficient documentation

## 2021-09-05 DIAGNOSIS — Z87891 Personal history of nicotine dependence: Secondary | ICD-10-CM | POA: Diagnosis not present

## 2021-09-05 DIAGNOSIS — I5023 Acute on chronic systolic (congestive) heart failure: Secondary | ICD-10-CM | POA: Diagnosis not present

## 2021-09-05 DIAGNOSIS — M47816 Spondylosis without myelopathy or radiculopathy, lumbar region: Secondary | ICD-10-CM | POA: Insufficient documentation

## 2021-09-05 DIAGNOSIS — Z7982 Long term (current) use of aspirin: Secondary | ICD-10-CM | POA: Diagnosis not present

## 2021-09-05 DIAGNOSIS — M545 Low back pain, unspecified: Secondary | ICD-10-CM

## 2021-09-05 DIAGNOSIS — G8929 Other chronic pain: Secondary | ICD-10-CM

## 2021-09-05 DIAGNOSIS — E039 Hypothyroidism, unspecified: Secondary | ICD-10-CM | POA: Insufficient documentation

## 2021-09-05 DIAGNOSIS — I110 Hypertensive heart disease with heart failure: Secondary | ICD-10-CM | POA: Diagnosis not present

## 2021-09-05 DIAGNOSIS — J449 Chronic obstructive pulmonary disease, unspecified: Secondary | ICD-10-CM | POA: Diagnosis not present

## 2021-09-05 MED ORDER — TRAMADOL HCL 50 MG PO TABS
50.0000 mg | ORAL_TABLET | Freq: Once | ORAL | Status: AC
  Administered 2021-09-05: 50 mg via ORAL
  Filled 2021-09-05: qty 1

## 2021-09-05 MED ORDER — PREDNISONE 20 MG PO TABS
60.0000 mg | ORAL_TABLET | Freq: Once | ORAL | Status: AC
  Administered 2021-09-05: 60 mg via ORAL
  Filled 2021-09-05: qty 3

## 2021-09-05 MED ORDER — PREDNISONE 10 MG PO TABS: ORAL_TABLET | ORAL | 0 refills | Status: DC

## 2021-09-05 MED ORDER — HYDROCODONE-ACETAMINOPHEN 5-325 MG PO TABS: 1.0000 | ORAL_TABLET | ORAL | 0 refills | Status: DC | PRN

## 2021-09-05 NOTE — ED Triage Notes (Signed)
Patient complains of lower back pain for the past few months, reports it has been getting worse the past few days. Cannot get out of bed in the morning due to the pain. Also states she has taken extra lasix to get rid of her extra fluid, has lost about 7 lbs in the past two days. Denies urinary issues. Denies falls or trauma.

## 2021-09-05 NOTE — ED Notes (Signed)
Pt reports no change in pain levels following medications.

## 2021-09-05 NOTE — ED Provider Notes (Signed)
[facility] DEPT Provider Note   CSN: [id number] Arrival date & time: 09/05/21  1022     History Chief Complaint  Patient presents with   Back Pain    [name] is a 75 y.o. female.  HPI She complains of low back left-sided pain for several months without relief.  No known trauma.  She has talked to her doctor about it but not had any interventions or evaluations for it.  No known trauma.  She is concerned about renal insufficiency and has been referred to nephrology.  She is using Tylenol for pain.  There are no other known active modifying factors.    Past Medical History:  Diagnosis Date   Bilateral leg cramps    Bladder neoplasm    Cancer (HCC)    CHF (congestive heart failure) (HCC)    Chronic deep vein thrombosis (DVT) of right lower extremity ([place])    05/ 2018  chronic non-occlusive DVT RLE   COPD, frequent exacerbations ([place])    03-06-2018 per pt last exacerbation 12/ 2018   Coronary artery disease    cardiologist-  dr Aundra Dubin-- 03-11-2018 er cath , 80% stenosis in the ostial second diagonal   Dyspnea on minimal exertion    Emphysema/COPD ([place])    CAT score- 17   Family history of breast cancer    Family history of colon cancer    Family history of melanoma    Family history of ovarian cancer    Family history of pancreatic cancer    Fibromyalgia 1995   GERD (gastroesophageal reflux disease)    Hiatal hernia    History of breast cancer    History of diverticulitis of colon    2002  s/p  sigmoid colectomy   History of multiple pulmonary nodules    hx RUL nodules x2  s/p right VATS w/ wedge resection 09-11-2005 and 06-14-2011  both  necrotizing granulomatous inflammation w/ cystic area of necrosis & focal calcification   History of right breast cancer oncologist-  dr Jana Hakim-- no recurrence   dx 2010--  IDC, Stage IA , ER/PR+,  (pT1c pN0) 11-09-2008 right lumpectomy;  12-18-2008 right simple mastectomy for DCIS margins;   completed chemotherapy 2010 (no radiation) and completed antiestrogen therapy   Hx of colonic polyps    Dr. 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   Nocturia    OA (osteoarthritis)    thumbs   On supplemental oxygen therapy    03-06-2018 per pt uses only at night,  checks O2 sats at home,  stated am sat 89% after moving around average 93-94% with RA   Osteoporosis    Peripheral arterial occlusive disease ([place]) vascular-- dr chen/ dr Donzetta Matters   proximal right SFA severe focal stenosis with collaterals from the PFA/  04/ 2012 occluded celiac and SMA arteries with distal reconstitution w/ patent IMA   Peripheral vascular disease ([place])    chronic DVT RLE,  mesenteric vascular disease   Right lower lobe pulmonary nodule    Chest CT 08-29-2017   Stage 3 severe COPD by GOLD classification ([place]) hx frequent exacerbations--   pulmologist-  dr Maryann Alar--  per lov note , dated 12-20-2017, oxyogen 2L is prescribed for use with exertion (but pt only uses mostly at night), O2 sats on RA run the 80s , this day sat 86% RA  and with 2L O2 sat 92%   Varicose veins of leg with swelling    varicose vein surgery - Dr. Aleda Grana   Wears glasses    Wears hearing aid in both ears     Patient Active Problem List   Diagnosis Date Noted   CAP (community acquired pneumonia) 08/01/2021   Sepsis due to pneumonia ([place]) 67/89/3810   Systolic CHF, chronic ([place]) 07/31/2021   Prediabetes 07/19/2021   Hemoptysis 10/05/2020   Hypokalemia 09/26/2020   OSA (obstructive sleep apnea) 09/11/2020   SOB (shortness of breath)    Acute on chronic systolic CHF (congestive heart failure) ([place])    Acute respiratory failure with hypoxia ([place]) 08/14/2020   Esophageal dysmotility 08/14/2020   Atherosclerosis of aorta ([place]) 08/14/2020   Snoring 06/26/2020   Shortness of breath    Acute systolic CHF (congestive heart  failure) ([place]) 05/21/2020   Major depressive disorder, single episode, moderate degree ([place]) 05/21/2020   COPD exacerbation ([place]) 05/20/2020   Acute on chronic respiratory failure with hypoxia ([place]) 05/20/2020   Genetic testing 12/12/2018   Family history of breast cancer    Family history of ovarian cancer    Family history of pancreatic cancer    Family history of colon cancer    Family history of melanoma    History of breast cancer    Chronic respiratory failure with hypoxia ([place]) 10/14/2017   COPD with acute exacerbation ([place]) 10/10/2017   Flu vaccine need 09/09/2017   Anxiety disorder 09/09/2017   Fibromyalgia 09/09/2017   Thrombocytosis 06/21/2017   Malignant neoplasm of overlapping sites of right breast in female, estrogen receptor positive ([place]) 06/05/2017   Dermatitis 04/02/2017   Edema of right lower extremity 02/26/2017   Positive ANA (antinuclear antibody) 02/08/2017   Elevated IgE level and eosinophilia 12/31/2016   Oral thrush 11/28/2016   Rash and nonspecific skin eruption 11/28/2016   Allergic rhinitis 11/28/2016   History of bacterial pneumonia 08/09/2016   Insomnia 03/04/2015   Breast cancer, right breast ([place]) 02/17/2015   CAD (coronary artery disease) 07/11/2011   Tobacco abuse 03/27/2011   Chronic mesenteric ischemia ([place]) 03/27/2011   Solitary pulmonary nodule 03/27/2011   PAD (peripheral artery disease) ([place]) 03/04/2011   Arthropathy 11/09/2010   Hypothyroidism 09/20/2009   Hyperlipidemia 09/20/2009   Hearing loss 09/14/2008   Essential hypertension 09/08/2007   COPD, frequent exacerbations ([place]) 09/08/2007   GERD 09/08/2007    Past Surgical History:  Procedure Laterality Date   ABDOMINAL AORTOGRAM N/A 07/11/2020   Procedure: ABDOMINAL AORTOGRAM;  Surgeon: Waynetta Sandy, MD;  Location: [facility];  Service: Cardiovascular;  Laterality: N/A;   AORTIC ARCH ANGIOGRAPHY N/A 07/11/2020   Procedure: AORTIC ARCH ANGIOGRAPHY;  Surgeon:  Waynetta Sandy, MD;  Location: [facility];  Service: Cardiovascular;  Laterality: N/A;   BREAST EXCISIONAL BIOPSY Left 10/15/2008   BREAST LUMPECTOMY W/ NEEDLE LOCALIZATION Right 11-09-2008    dr cornett   [facility]  03-12-2011   dr Aundra Dubin   70-80% ostial stenosis in small second diagonal (appears to small for intervention),  dLAD 40-50%,  minimal luminal irregulartieis involoving LCFx and RCA,  LVEF 55%   CATARACT EXTRACTION W/ INTRAOCULAR LENS  IMPLANT, BILATERAL  2011   CYSTOSCOPY N/A 03/11/2018   Procedure: CYSTOSCOPY WITH INSTILLATION OF POST OPERATIVE EPIRUBICIN;  Surgeon: Festus Aloe, MD;  Location: [facility];  Service: Urology;  Laterality: N/A;   LEFT HEART CATH AND CORONARY ANGIOGRAPHY N/A 05/25/2020  Procedure: LEFT HEART CATH AND CORONARY ANGIOGRAPHY;  Surgeon: Troy Sine, MD;  Location: [facility];  Service: Cardiovascular;  Laterality: N/A;   MASTECTOMY Right    PARTIAL COLECTOMY  2002   sigmoid and appectomy (diverticulitis)   PARTIAL MASTECTOMY WITH NEEDLE LOCALIZATION Left 02/23/2013   Procedure:  LEFT PARTIAL MASTECTOMY WITH NEEDLE LOCALIZATION;  Surgeon: Adin Hector, MD;  Location: [facility];  Service: General;  Laterality: Left;   PERIPHERAL VASCULAR INTERVENTION  07/11/2020   Procedure: PERIPHERAL VASCULAR INTERVENTION;  Surgeon: Waynetta Sandy, MD;  Location: [facility];  Service: Cardiovascular;;   RECONSTRUCTION BREAST W/ LATISSIMUS DORSI FLAP Right 05-30-2009   dr Harlow Mares  [facility]   REDUCTION MAMMAPLASTY Left    SIMPLE MASTECTOMY Right 12-28-2008    dr cornett  [facility]   TRANSTHORACIC ECHOCARDIOGRAM  07-09-2016  dr Aundra Dubin   ef 55-60%, grade 1 diastoic dysfunction/  mild TR   TRANSURETHRAL RESECTION OF BLADDER TUMOR N/A 03/11/2018   Procedure: TRANSURETHRAL RESECTION OF BLADDER TUMOR (TURBT) 2-5cm;  Surgeon: Festus Aloe, MD;  Location: [facility];  Service: Urology;  Laterality: N/A;    UPPER EXTREMITY ANGIOGRAPHY Left 07/11/2020   Procedure: Upper Extremity Angiography;  Surgeon: Waynetta Sandy, MD;  Location: [facility];  Service: Cardiovascular;  Laterality: Left;   VAGINAL HYSTERECTOMY  1973   VIDEO ASSISTED THORACOSCOPY (VATS)/WEDGE RESECTION Right 09-11-2005  &  06-14-2011   dr hendrickso  [facility]   both RUL     OB History   No obstetric history on file.     Family History  Problem Relation Age of Onset   Colon cancer Mother        colon   COPD Mother        brown lung   Hypertension Mother    Diabetes Mother    Emphysema Mother    Coronary artery disease Father    Heart attack Father    Sudden death Father    Heart disease Father    Cancer Sister        throat   Hypertension Sister    Coronary artery disease Brother    Heart attack Brother        early 70s   Throat cancer Sister    Ovarian cancer Sister        fallopian tube cancer in her 33s   Melanoma Sister    Hypertension Sister    Pancreatic cancer Sister    Melanoma Niece        dx in her 65s   Breast cancer Niece 50    Social History   Tobacco Use   Smoking status: Former    Packs/day: 0.75    Years: 50.00    Pack years: 37.50    Types: Cigarettes    Quit date: 05/18/2016    Years since quitting: 5.3   Smokeless tobacco: Never  Vaping Use   Vaping Use: Never used  Substance Use Topics   Alcohol use: Yes    Alcohol/week: 10.0 standard drinks    Types: 10 Cans of beer per week   Drug use: No    Home Medications Prior to Admission medications   Medication Sig Start Date End Date Taking? Authorizing Provider  HYDROcodone-acetaminophen (NORCO) 5-325 MG tablet Take 1 tablet by mouth every 4 (four) hours as needed. 09/05/21  Yes Daleen Bo, MD  predniSONE (DELTASONE) 10 MG tablet Take q dqy 6,5,4,3,2,1 09/05/21  Yes Daleen Bo, MD  albuterol (VENTOLIN HFA) 108 (90 Base) MCG/ACT inhaler TAKE 2 PUFFS BY MOUTH EVERY 6 HOURS AS NEEDED FOR WHEEZE OR  SHORTNESS OF BREATH Patient taking differently: Inhale 1 puff into the lungs every 4 (four) hours as needed for wheezing or shortness of breath. 09/05/20   Martyn Ehrich, NP  aspirin EC 81 MG tablet Take 1 tablet (81 mg total) by mouth daily. 08/21/20   Cherene Altes, MD  atorvastatin (LIPITOR) 80 MG tablet Take 1 tablet (80 mg total) by mouth daily. 06/15/21   Freada Bergeron, MD  budesonide (PULMICORT) 0.5 MG/2ML nebulizer solution Take 2 mLs (0.5 mg total) by nebulization 2 (two) times daily. 09/15/20   Elie Confer, MD  cilostazol (PLETAL) 100 MG tablet TAKE 1 TABLET BY MOUTH TWICE A DAY Patient taking differently: Take 100 mg by mouth 2 (two) times daily. 11/28/20   [name], NP  clopidogrel (PLAVIX) 75 MG tablet TAKE 1 TABLET BY MOUTH EVERY DAY Patient taking differently: Take 75 mg by mouth daily. 07/08/21   Waynetta Sandy, MD  DUPIXENT 300 MG/2ML SOPN Inject 2 mLs into the skin every 14 (fourteen) days. 01/03/21   [provider]  esomeprazole (NEXIUM) 40 MG capsule Take 1 capsule (40 mg total) by mouth daily. 05/19/21   [place], [name], MD  ferrous sulfate 325 (65 FE) MG tablet Take 1 tablet (325 mg total) by mouth daily with breakfast. 08/09/21 09/08/21  Elodia Florence., MD  fluticasone [facility]) 50 MCG/ACT nasal spray USE 1 SPRAY INTO EACH NOSTRIL TWICE A DAY Patient taking differently: Place 1 spray into both nostrils 2 (two) times daily. 06/16/21   [place], [name], MD  Fluticasone-Umeclidin-Vilant (TRELEGY ELLIPTA) 200-62.5-25 MCG/INH AEPB Inhale 1 puff into the lungs daily. 07/21/21   Deneise Lever, MD  furosemide (LASIX) 40 MG tablet TAKE 1 TABLET BY MOUTH TWICE A DAY Patient taking differently: Take 40 mg by mouth 2 (two) times daily. 06/26/21   [place], [name], MD  guaiFENesin (MUCINEX) 600 MG 12 hr tablet Take 600 mg by mouth daily.    [provider]  hydrOXYzine (ATARAX/VISTARIL) 25 MG tablet Take 25 mg by mouth daily.  03/31/21   [provider]  ipratropium-albuterol (DUONEB) 0.5-2.5 (3) MG/3ML SOLN INHALE 3 MLS INTO THE LUNGS EVERY 6 (SIX) HOURS AS NEEDED (SOB, WHEEZING). 05/09/21   Martyn Ehrich, NP  KLOR-CON M20 20 MEQ tablet TAKE 1 TABLET BY MOUTH THREE TIMES A DAY 08/23/21   [place], [name], MD  levothyroxine (SYNTHROID) 112 MCG tablet TAKE 1 TABLET BY MOUTH EVERY DAY IN THE MORNING BEFORE BREAKFAST Patient taking differently: Take 112 mcg by mouth daily before breakfast. 12/16/20   [place], [name], MD  LORazepam (ATIVAN) 2 MG tablet Take 1 tablet (2 mg total) by mouth at bedtime. 05/19/21   [place], [name], MD  montelukast (SINGULAIR) 10 MG tablet TAKE 1 TABLET BY MOUTH EVERYDAY AT BEDTIME Patient taking differently: Take 10 mg by mouth at bedtime. 04/21/21   Martyn Ehrich, NP  PARoxetine (PAXIL) 20 MG tablet TAKE 1 TABLET BY MOUTH EVERYDAY AT BEDTIME Patient taking differently: Take 20 mg by mouth daily. 02/27/21   [place], [name], MD  tamsulosin (FLOMAX) 0.4 MG CAPS capsule TAKE 1 CAPSULE BY MOUTH EVERY DAY Patient taking differently: Take 0.4 mg by mouth daily. 07/17/21   [place], [name], MD  traZODone (DESYREL) 100 MG tablet TAKE 1 TABLET BY MOUTH EVERYDAY AT BEDTIME Patient taking  differently: Take 100 mg by mouth at bedtime. 06/20/21   [place], [name], MD    Allergies    Sinequan [doxepin] and Sulfa antibiotics  Review of Systems   Review of Systems  All other systems reviewed and are negative.  Physical Exam Updated Vital Signs BP 110/70   Pulse 74   Temp (!) 97.5 F (36.4 C) (Oral)   Resp 18   SpO2 92%   Physical Exam Vitals and nursing note reviewed.  Constitutional:      General: She is not in acute distress.    Appearance: She is well-developed. She is not ill-appearing, toxic-appearing or diaphoretic.  HENT:     Head: Normocephalic and atraumatic.  Eyes:     Conjunctiva/sclera: Conjunctivae normal.     Pupils: Pupils are equal, round, and reactive to light.  Neck:      Trachea: Phonation normal.  Cardiovascular:     Rate and Rhythm: Normal rate and regular rhythm.  Pulmonary:     Effort: Pulmonary effort is normal.     Breath sounds: Normal breath sounds.  Chest:     Chest wall: No tenderness.  Abdominal:     General: There is no distension.     Palpations: Abdomen is soft.     Tenderness: There is no abdominal tenderness. There is no guarding.  Musculoskeletal:        General: Normal range of motion.     Cervical back: Normal range of motion and neck supple.     Right lower leg: No edema.     Left lower leg: No edema.     Comments: She guards against moving the low back secondary to pain.  She is moderately tender in the left lateral lumbar region.  No palpable deformities of the thoracic or lumbar spines.  Skin:    General: Skin is warm and dry.  Neurological:     Mental Status: She is alert and oriented to person, place, and time.     Motor: No abnormal muscle tone.  Psychiatric:        Mood and Affect: Mood normal.        Behavior: Behavior normal.        Thought Content: Thought content normal.        Judgment: Judgment normal.    ED Results / Procedures / Treatments   Labs (all labs ordered are listed, but only abnormal results are displayed) Labs Reviewed - No data to display  EKG None  Radiology DG Lumbar Spine Complete  Result Date: 09/05/2021 CLINICAL DATA:  Back pain EXAM: LUMBAR SPINE - COMPLETE 4+ VIEW COMPARISON:  None. FINDINGS: No recent fracture is seen. Alignment of posterior margins of vertebral bodies is unremarkable. Degenerative changes are noted with disc space narrowing and bony spurs at L2-L3 level. Degenerative changes are noted in facet joints, more so at L5-S1 level. There is mild dextroscoliosis in the upper lumbar spine. Arterial calcifications are seen in the aorta and its major branches. Surgical clips are seen in the right upper quadrant. IMPRESSION: No recent fracture is seen. Degenerative changes are  noted at multiple levels as described in the body of the report. Electronically Signed   By: Elmer Picker M.D.   On: 09/05/2021 11:09    Procedures Procedures   Medications Ordered in ED Medications  predniSONE (DELTASONE) tablet 60 mg (60 mg Oral Given 09/05/21 1200)  traMADol (ULTRAM) tablet 50 mg (50 mg Oral Given 09/05/21 1201)    ED Course  I have reviewed the triage vital signs and the nursing notes.  Pertinent labs & imaging results that were available during my care of the patient were reviewed by me and considered in my medical decision making (see chart for details).    MDM Rules/Calculators/A&P                            Patient Vitals for the past 24 hrs:  BP Temp Temp src Pulse Resp SpO2  09/05/21 1415 110/70 -- -- 74 18 92 %  09/05/21 1400 125/67 -- -- 73 -- 92 %  09/05/21 1345 109/66 -- -- 69 -- 91 %  09/05/21 1330 122/72 -- -- 73 18 93 %  09/05/21 1315 122/64 -- -- 77 -- 91 %  09/05/21 1300 (!) 149/73 -- -- 85 -- 91 %  09/05/21 1245 133/61 -- -- 72 -- 92 %  09/05/21 1230 (!) 112/54 -- -- 71 18 92 %  09/05/21 1215 (!) 88/66 -- -- 68 -- 92 %  09/05/21 1200 115/74 -- -- 67 -- 96 %  09/05/21 1145 (!) 146/63 -- -- 68 -- 96 %  09/05/21 1130 117/86 -- -- 62 18 94 %  09/05/21 1115 129/67 -- -- 62 -- 97 %  09/05/21 1100 (!) 147/74 -- -- -- -- --  09/05/21 1045 138/88 -- -- 62 -- 99 %  09/05/21 1033 (!) 144/83 (!) 97.5 F (36.4 C) Oral 67 18 97 %    2:34 PM Reevaluation with update and discussion. After initial assessment and treatment, an updated evaluation reveals she reports marginal improvement after medications. Daleen Bo   Medical Decision Making:  This patient is presenting for evaluation of low back pain without trauma, which does require a range of treatment options, and is a complaint that involves a moderate risk of morbidity and mortality. The differential diagnoses include occult fracture, arthritis, nonspecific back pain. I decided to  review old records, and in summary elderly female with ongoing pain, for several months without trauma.  No red flags for acute spine disorders.  I obtained additional historical information from husband at bedside.   Radiologic Tests Ordered, included plan which shows lumbar spine.  I independently Visualized: Radiography images, which show degenerative changes    Critical Interventions-clinical evaluation, medication treatment, radiography, observation and reassessment  After These Interventions, the Patient was reevaluated and was found stable for discharge.  Low back pain secondary to degenerative changes without fracture or myelopathy.  No indication for further ED evaluation or hospitalization at this time.  CRITICAL CARE-no Performed by: Daleen Bo  Nursing Notes Reviewed/ Care Coordinated Applicable Imaging Reviewed Interpretation of Laboratory Data incorporated into ED treatment  The patient appears reasonably screened and/or stabilized for discharge and I doubt any other medical condition or other [facility] requiring further screening, evaluation, or treatment in the ED at this time prior to discharge.  Plan: Home Medications-continue current; Home Treatments-correct advance activity; return here if the recommended treatment, does not improve the symptoms; Recommended follow up-neurosurgery follow-up for further evaluation and treatment     Final Clinical Impression(s) / ED Diagnoses Final diagnoses:  Chronic right-sided low back pain without sciatica  Spondylosis of lumbar region without myelopathy or radiculopathy    Rx / DC Orders ED Discharge Orders          Ordered    HYDROcodone-acetaminophen (NORCO) 5-325 MG tablet  Every 4 hours PRN        09/05/21  1433    predniSONE (DELTASONE) 10 MG tablet        09/05/21 1433             Daleen Bo, MD 09/05/21 1436

## 2021-09-05 NOTE — Discharge Instructions (Signed)
Your back pain is likely secondary to degenerative joint disease of the lumbar spine.  We are prescribing prednisone to help with inflammation and hydrocodone to help with pain control.  Do not drive or operate machinery when taking the hydrocodone.  Call the neurosurgeon for a follow-up appointment as soon as possible.

## 2021-09-12 ENCOUNTER — Telehealth: Payer: Self-pay | Admitting: Family Medicine

## 2021-09-12 ENCOUNTER — Encounter: Payer: Self-pay | Admitting: Family Medicine

## 2021-09-12 ENCOUNTER — Telehealth (INDEPENDENT_AMBULATORY_CARE_PROVIDER_SITE_OTHER): Payer: Medicare Other | Admitting: Family Medicine

## 2021-09-12 VITALS — Ht 66.0 in

## 2021-09-12 DIAGNOSIS — M545 Low back pain, unspecified: Secondary | ICD-10-CM | POA: Diagnosis not present

## 2021-09-12 MED ORDER — HYDROCODONE-ACETAMINOPHEN 5-325 MG PO TABS: 1.0000 | ORAL_TABLET | Freq: Three times a day (TID) | ORAL | 0 refills | Status: AC | PRN

## 2021-09-12 NOTE — Telephone Encounter (Signed)
We had an opening at noon today, patient has be scheduled for this time slot.

## 2021-09-12 NOTE — Progress Notes (Signed)
Virtual Visit via Video Note I connected with [name] on 09/12/21 by a video enabled telemedicine application and verified that I am speaking with the correct person using two identifiers.  Location patient: home Location provider:work office Persons participating in the virtual visit: patient, son,provider  I discussed the limitations of evaluation and management by telemedicine and the availability of in person appointments. The patient expressed understanding and agreed to proceed.  Chief Complaint  Patient presents with   Medication Refill    Wanting to know if her pain meds can be refilled, has appt with the surgeon on Friday.    HPI: [name] is a 75 yo female with hx of COPD,anxiety,PAD,CHF, hearing loss,and insomnia who was recently evaluated in the ER. Because hearing loss, her son helps with communication.  She has had right-sided lower back pain since early summer and gradually getting worse. Pain is not radiated. Negative for fever,chills,abdominal pain,N/V,changes in bowel habits, urinary symptoms,LE numbness,tingling,saddle anesthesia,or changes in bowel/bladder function. No edema or erythema on affected area.  Denies hx of trauma or unusual physical activity. Pain is constant, "excruciating" pain.  She presented to the ED on 09/05/21 because several months of left-sided lower back pain. Lumbar X ray was negative for recent fracture. Degenerative changes are noted at multiple levels as described in the body of the report. She has an appt with ortho this coming Friday. She was given Tramadol and Prednisone and discharged on hydrocodone-acetaminophen 5-325 mg. She is taking hydrocodone-acetaminophen 5-325 mg every 8 hours as needed, she still has 3 tablets left.  She is on lorazepam 2 mg daily for anxiety. COPD with frequent exacerbation, she is on Pulmicort 0.5 mg neb treatments twice daily, albuterol inhaler 2 puffs every 4 hours as needed, and DuoNeb every 6  hours as needed. She follows with pulmonologist. Hospitalization in 07/2021 due to community-acquired pneumonia.  ROS: See pertinent positives and negatives per HPI.  Past Medical History:  Diagnosis Date   Bilateral leg cramps    Bladder neoplasm    Cancer (HCC)    CHF (congestive heart failure) (HCC)    Chronic deep vein thrombosis (DVT) of right lower extremity ([place])    05/ 2018  chronic non-occlusive DVT RLE   COPD, frequent exacerbations ([place])    03-06-2018 per pt last exacerbation 12/ 2018   Coronary artery disease    cardiologist-  dr Aundra Dubin-- 03-11-2018 er cath , 80% stenosis in the ostial second diagonal   Dyspnea on minimal exertion    Emphysema/COPD ([place])    CAT score- 17   Family history of breast cancer    Family history of colon cancer    Family history of melanoma    Family history of ovarian cancer    Family history of pancreatic cancer    Fibromyalgia 1995   GERD (gastroesophageal reflux disease)    Hiatal hernia    History of breast cancer    History of diverticulitis of colon    2002  s/p  sigmoid colectomy   History of multiple pulmonary nodules    hx RUL nodules x2  s/p right VATS w/ wedge resection 09-11-2005 and 06-14-2011  both  necrotizing granulomatous inflammation w/ cystic area of necrosis & focal calcification   History of right breast cancer oncologist-  dr Jana Hakim-- no recurrence   dx 2010--  IDC, Stage IA , ER/PR+,  (pT1c pN0) 11-09-2008 right lumpectomy;  12-18-2008 right simple mastectomy for DCIS margins;  completed chemotherapy 2010 (no radiation) and completed  antiestrogen therapy   Hx of colonic polyps    Dr. 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   Nocturia    OA (osteoarthritis)    thumbs   On supplemental oxygen therapy    03-06-2018 per pt uses only at night,  checks O2 sats at home,  stated am sat 89% after moving  around average 93-94% with RA   Osteoporosis    Peripheral arterial occlusive disease ([place]) vascular-- dr chen/ dr Donzetta Matters   proximal right SFA severe focal stenosis with collaterals from the PFA/  04/ 2012 occluded celiac and SMA arteries with distal reconstitution w/ patent IMA   Peripheral vascular disease ([place])    chronic DVT RLE,  mesenteric vascular disease   Right lower lobe pulmonary nodule    Chest CT 08-29-2017   Stage 3 severe COPD by GOLD classification ([place]) hx frequent exacerbations--   pulmologist-  dr Maryann Alar--  per lov note , dated 12-20-2017, oxyogen 2L is prescribed for use with exertion (but pt only uses mostly at night), O2 sats on RA run the 80s , this day sat 86% RA and with 2L O2 sat 92%   Varicose veins of leg with swelling    varicose vein surgery - Dr. Aleda Grana   Wears glasses    Wears hearing aid in both ears    Past Surgical History:  Procedure Laterality Date   ABDOMINAL AORTOGRAM N/A 07/11/2020   Procedure: ABDOMINAL AORTOGRAM;  Surgeon: Waynetta Sandy, MD;  Location: [facility];  Service: Cardiovascular;  Laterality: N/A;   AORTIC ARCH ANGIOGRAPHY N/A 07/11/2020   Procedure: AORTIC ARCH ANGIOGRAPHY;  Surgeon: Waynetta Sandy, MD;  Location: [facility];  Service: Cardiovascular;  Laterality: N/A;   BREAST EXCISIONAL BIOPSY Left 10/15/2008   BREAST LUMPECTOMY W/ NEEDLE LOCALIZATION Right 11-09-2008    dr cornett   [place]  03-12-2011   dr Aundra Dubin   70-80% ostial stenosis in small second diagonal (appears to small for intervention),  dLAD 40-50%,  minimal luminal irregulartieis involoving LCFx and RCA,  LVEF 55%   CATARACT EXTRACTION W/ INTRAOCULAR LENS  IMPLANT, BILATERAL  2011   CYSTOSCOPY N/A 03/11/2018   Procedure: CYSTOSCOPY WITH INSTILLATION OF POST OPERATIVE EPIRUBICIN;  Surgeon: Festus Aloe, MD;  Location: [facility];  Service: Urology;  Laterality: N/A;   LEFT HEART CATH AND CORONARY  ANGIOGRAPHY N/A 05/25/2020   Procedure: LEFT HEART CATH AND CORONARY ANGIOGRAPHY;  Surgeon: Troy Sine, MD;  Location: [facility];  Service: Cardiovascular;  Laterality: N/A;   MASTECTOMY Right    PARTIAL COLECTOMY  2002   sigmoid and appectomy (diverticulitis)   PARTIAL MASTECTOMY WITH NEEDLE LOCALIZATION Left 02/23/2013   Procedure:  LEFT PARTIAL MASTECTOMY WITH NEEDLE LOCALIZATION;  Surgeon: Adin Hector, MD;  Location: [facility];  Service: General;  Laterality: Left;   PERIPHERAL VASCULAR INTERVENTION  07/11/2020   Procedure: PERIPHERAL VASCULAR INTERVENTION;  Surgeon: Waynetta Sandy, MD;  Location: [facility];  Service: Cardiovascular;;   RECONSTRUCTION BREAST W/ LATISSIMUS DORSI FLAP Right 05-30-2009   dr Harlow Mares  [facility]   REDUCTION MAMMAPLASTY Left    SIMPLE MASTECTOMY Right 12-28-2008    dr cornett  [facility]   TRANSTHORACIC ECHOCARDIOGRAM  07-09-2016  dr Aundra Dubin   ef 55-60%, grade 1 diastoic dysfunction/  mild TR  TRANSURETHRAL RESECTION OF BLADDER TUMOR N/A 03/11/2018   Procedure: TRANSURETHRAL RESECTION OF BLADDER TUMOR (TURBT) 2-5cm;  Surgeon: Festus Aloe, MD;  Location: [facility];  Service: Urology;  Laterality: N/A;   UPPER EXTREMITY ANGIOGRAPHY Left 07/11/2020   Procedure: Upper Extremity Angiography;  Surgeon: Waynetta Sandy, MD;  Location: [facility];  Service: Cardiovascular;  Laterality: Left;   VAGINAL HYSTERECTOMY  1973   VIDEO ASSISTED THORACOSCOPY (VATS)/WEDGE RESECTION Right 09-11-2005  &  06-14-2011   dr hendrickso  [facility]   both RUL   Family History  Problem Relation Age of Onset   Colon cancer Mother        colon   COPD Mother        brown lung   Hypertension Mother    Diabetes Mother    Emphysema Mother    Coronary artery disease Father    Heart attack Father    Sudden death Father    Heart disease Father    Cancer Sister        throat   Hypertension Sister    Coronary artery disease Brother     Heart attack Brother        early 84s   Throat cancer Sister    Ovarian cancer Sister        fallopian tube cancer in her 70s   Melanoma Sister    Hypertension Sister    Pancreatic cancer Sister    Melanoma Niece        dx in her 38s   Breast cancer Niece 51   Social History   Socioeconomic History   Marital status: Married    Spouse name: Not on file   Number of children: 3   Years of education: 12   Highest education level: Not on file  Occupational History   Occupation: Chiropractor: RETIRED    Comment: retired  Tobacco Use   Smoking status: Former    Packs/day: 0.75    Years: 50.00    Pack years: 37.50    Types: Cigarettes    Quit date: 05/18/2016    Years since quitting: 5.3   Smokeless tobacco: Never  Vaping Use   Vaping Use: Never used  Substance and Sexual Activity   Alcohol use: Yes    Alcohol/week: 10.0 standard drinks    Types: 10 Cans of beer per week   Drug use: No   Sexual activity: Not on file  Other Topics Concern   Not on file  Social History Narrative   HSG, beauty school. Married '69 -28 yrs/widowed; married '79 - 58yr/divorced; married '87.  2 sons - '70, '74, 1 srtep-son; 1 grandchild. 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 SO - good health.  NO history of abuse.  ACP - full code and all heroic measures.    Social Determinants of Health   Financial Resource Strain: Low Risk    Difficulty of Paying Living Expenses: Not hard at all  Food Insecurity: No Food Insecurity   Worried About Charity fundraiser in the Last Year: Never true   [place] in the Last Year: Never true  Transportation Needs: No Transportation Needs   Lack of Transportation (Medical): No   Lack of Transportation (Non-Medical): No  Physical Activity: Inactive   Days of Exercise per Week: 0 days   Minutes of Exercise per Session: 0 min  Stress: No Stress Concern Present   Feeling of Stress : Not at all  Social  Connections: Socially Isolated   Frequency of  Communication with Friends and Family: Once a week   Frequency of Social Gatherings with Friends and Family: Never   Attends Religious Services: Never   Marine scientist or Organizations: No   Attends Music therapist: Never   Marital Status: Married  Human resources officer Violence: Not At Risk   Fear of Current or Ex-Partner: No   Emotionally Abused: No   Physically Abused: No   Sexually Abused: No   Current Outpatient Medications:    albuterol (VENTOLIN HFA) 108 (90 Base) MCG/ACT inhaler, TAKE 2 PUFFS BY MOUTH EVERY 6 HOURS AS NEEDED FOR WHEEZE OR SHORTNESS OF BREATH (Patient taking differently: Inhale 1 puff into the lungs every 4 (four) hours as needed for wheezing or shortness of breath.), Disp: 8.5 each, Rfl: 3   aspirin EC 81 MG tablet, Take 1 tablet (81 mg total) by mouth daily., Disp: 30 tablet, Rfl:    atorvastatin (LIPITOR) 80 MG tablet, Take 1 tablet (80 mg total) by mouth daily., Disp: 90 tablet, Rfl: 2   budesonide (PULMICORT) 0.5 MG/2ML nebulizer solution, Take 2 mLs (0.5 mg total) by nebulization 2 (two) times daily., Disp: 20 mL, Rfl: 12   cilostazol (PLETAL) 100 MG tablet, TAKE 1 TABLET BY MOUTH TWICE A DAY (Patient taking differently: Take 100 mg by mouth 2 (two) times daily.), Disp: 180 tablet, Rfl: 3   clopidogrel (PLAVIX) 75 MG tablet, TAKE 1 TABLET BY MOUTH EVERY DAY (Patient taking differently: Take 75 mg by mouth daily.), Disp: 90 tablet, Rfl: 3   DUPIXENT 300 MG/2ML SOPN, Inject 2 mLs into the skin every 14 (fourteen) days., Disp: , Rfl:    esomeprazole (NEXIUM) 40 MG capsule, Take 1 capsule (40 mg total) by mouth daily., Disp: 90 capsule, Rfl: 1   ferrous sulfate 325 (65 FE) MG tablet, TAKE 1 TABLET BY MOUTH EVERY DAY WITH BREAKFAST, Disp: 90 tablet, Rfl: 1   fluticasone (FLONASE) 50 MCG/ACT nasal spray, USE 1 SPRAY INTO EACH NOSTRIL TWICE A DAY (Patient taking differently: Place 1 spray into both nostrils 2 (two) times daily.), Disp: 48 mL, Rfl: 1    Fluticasone-Umeclidin-Vilant (TRELEGY ELLIPTA) 200-62.5-25 MCG/INH AEPB, Inhale 1 puff into the lungs daily., Disp: 60 each, Rfl: 3   furosemide (LASIX) 40 MG tablet, TAKE 1 TABLET BY MOUTH TWICE A DAY (Patient taking differently: Take 40 mg by mouth 2 (two) times daily.), Disp: 180 tablet, Rfl: 1   guaiFENesin (MUCINEX) 600 MG 12 hr tablet, Take 600 mg by mouth daily., Disp: , Rfl:    HYDROcodone-acetaminophen (NORCO) 5-325 MG tablet, Take 1 tablet by mouth every 4 (four) hours as needed., Disp: 20 tablet, Rfl: 0   hydrOXYzine (ATARAX/VISTARIL) 25 MG tablet, Take 25 mg by mouth daily., Disp: , Rfl:    ipratropium-albuterol (DUONEB) 0.5-2.5 (3) MG/3ML SOLN, INHALE 3 MLS INTO THE LUNGS EVERY 6 (SIX) HOURS AS NEEDED (SOB, WHEEZING)., Disp: 360 mL, Rfl: 4   KLOR-CON M20 20 MEQ tablet, TAKE 1 TABLET BY MOUTH THREE TIMES A DAY, Disp: 270 tablet, Rfl: 3   levothyroxine (SYNTHROID) 112 MCG tablet, TAKE 1 TABLET BY MOUTH EVERY DAY IN THE MORNING BEFORE BREAKFAST (Patient taking differently: Take 112 mcg by mouth daily before breakfast.), Disp: 90 tablet, Rfl: 2   LORazepam (ATIVAN) 2 MG tablet, Take 1 tablet (2 mg total) by mouth at bedtime., Disp: 30 tablet, Rfl: 3   montelukast (SINGULAIR) 10 MG tablet, TAKE 1 TABLET BY MOUTH EVERYDAY AT  BEDTIME (Patient taking differently: Take 10 mg by mouth at bedtime.), Disp: 90 tablet, Rfl: 1   PARoxetine (PAXIL) 20 MG tablet, TAKE 1 TABLET BY MOUTH EVERYDAY AT BEDTIME (Patient taking differently: Take 20 mg by mouth daily.), Disp: 90 tablet, Rfl: 1   predniSONE (DELTASONE) 10 MG tablet, Take q dqy 6,5,4,3,2,1, Disp: 21 tablet, Rfl: 0   tamsulosin (FLOMAX) 0.4 MG CAPS capsule, TAKE 1 CAPSULE BY MOUTH EVERY DAY (Patient taking differently: Take 0.4 mg by mouth daily.), Disp: 90 capsule, Rfl: 0   traZODone (DESYREL) 100 MG tablet, TAKE 1 TABLET BY MOUTH EVERYDAY AT BEDTIME (Patient taking differently: Take 100 mg by mouth at bedtime.), Disp: 90 tablet, Rfl:  1  EXAM:  VITALS per patient if applicable:Ht 5\' 6"  (1.676 m)   BMI 20.50 kg/m   GENERAL: alert, oriented, appears well and in no acute distress  HEENT: atraumatic, conjunctiva clear, no obvious abnormalities on inspection.  NECK: normal movements of the head and neck  LUNGS: on inspection no signs of respiratory distress, breathing rate appears normal, no obvious gross SOB, gasping or wheezing  CV: no obvious cyanosis  MS: moves all visible extremities without noticeable abnormality  PSYCH/NEURO: pleasant and cooperative, no obvious depression, + anxious.Speech and thought processing grossly intact  ASSESSMENT AND PLAN:  Discussed the following assessment and plan:  Right low back pain, unspecified chronicity, unspecified whether sciatica present - Plan: HYDROcodone-acetaminophen (NORCO) 5-325 MG tablet Pain is severe, she has an appt with ortho in a few days. We do not have many options for pain management due to her chronic medical; conditions. We discussed side effects of hydrocodone-acetaminophen as well as the risk of med interaction+ exacerbation of COPD symptoms as well as risk for falls. She understands risks, "I have to have it." Instructed about warning signs.  She understand that Rx is not intended for chronic treatment, it is one time refill. [place] controlled substance report reviewed.  We discussed possible serious and likely etiologies, options for evaluation and workup, limitations of telemedicine visit vs in person visit, treatment, treatment risks and precautions.  I discussed the assessment and treatment plan with the patient.  She and her son were provided an opportunity to ask questions and all were answered. The patient agreed with the plan and demonstrated an understanding of the instructions.  Return if symptoms worsen or fail to improve, for Keep next appt.  Joelle Flessner [place], MD

## 2021-09-12 NOTE — Telephone Encounter (Signed)
Patient called to see if she could get a refill on HYDROcodone-acetaminophen ([place]) 5-325 MG tablet  but it was prescribed by provider during ER visit. Patient is scheduled for telephone appointment tomorrow at 3:30, but wanted to know if it could be sent today. Patient has three pills left and her appointment with the surgeon is on Friday.    Please [facility] to  [facility] [place] [place] Phone:  [phone]  Fax:  [phone]       Good callback number is [phone]       Please advise

## 2021-09-13 ENCOUNTER — Telehealth: Payer: Medicare Other | Admitting: Family Medicine

## 2021-09-15 ENCOUNTER — Other Ambulatory Visit: Payer: Self-pay | Admitting: Family Medicine

## 2021-09-15 DIAGNOSIS — E039 Hypothyroidism, unspecified: Secondary | ICD-10-CM

## 2021-09-22 ENCOUNTER — Other Ambulatory Visit: Payer: Self-pay | Admitting: Family Medicine

## 2021-09-24 ENCOUNTER — Other Ambulatory Visit: Payer: Self-pay | Admitting: Family Medicine

## 2021-09-24 DIAGNOSIS — G4700 Insomnia, unspecified: Secondary | ICD-10-CM

## 2021-09-24 DIAGNOSIS — F419 Anxiety disorder, unspecified: Secondary | ICD-10-CM

## 2021-09-25 NOTE — Telephone Encounter (Signed)
-   last filled 08/22/21 - next office visit scheduled for 12/18/21

## 2021-09-28 ENCOUNTER — Telehealth: Payer: Self-pay | Admitting: *Deleted

## 2021-09-28 NOTE — Telephone Encounter (Signed)
° °  Pre-operative Risk Assessment    Patient Name: [name]  DOB: 1946-06-14 MRN: [id number]      Request for Surgical Clearance    Procedure:   LEFT S1 TF ESI  Date of Surgery:  Clearance TBD                                 Surgeon:  DR. Doreatha Martin Surgeon's Group or Practice Name:  [facility] Phone number:  [phone] Fax number:  [phone] ATTN: X-RAY   Type of Clearance Requested:   - Medical  - Pharmacy:  Hold Clopidogrel (Plavix)     Type of Anesthesia:  Not Indicated    Additional requests/questions:    Jiles Prows   09/28/2021, 5:42 PM

## 2021-09-29 NOTE — Telephone Encounter (Signed)
° °  Name: [name]  DOB: 04-Oct-1946  MRN: [id number]   Primary Cardiologist: Fransico Him, MD  Chart reviewed as part of pre-operative protocol coverage. Patient was contacted 09/29/2021 in reference to pre-operative risk assessment for pending surgery as outlined below.  [name] was last seen on 12/29/2020 by Dr. Johney Frame.  Since that day, [name] has done well without exertional chest pain or worsening dyspnea.  Therefore, based on ACC/AHA guidelines, the patient would be at acceptable risk for the planned procedure without further cardiovascular testing.   Plavix therapy was prescribed by Dr. Servando Snare due to her history of vascular issue.  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 Donzetta Matters.  Will defer the holding time to vascular surgery instead.  The patient was advised that if she develops new symptoms prior to surgery to contact our office to arrange for a follow-up visit, and she verbalized understanding.  I will route this recommendation to the requesting party via Epic fax function and remove from pre-op pool. Please call with questions.  [place], [place] 09/29/2021, 11:17 AM

## 2021-09-30 ENCOUNTER — Other Ambulatory Visit: Payer: Self-pay | Admitting: Family Medicine

## 2021-10-02 IMAGING — CT CT ANGIO CHEST
2 of 6 series · 18 of 36 positions shown · IV contrast (OMNIPAQUE 350)
Comparison: December 01, 2019

CLINICAL DATA: Shortness of breath x2 weeks.

EXAM:
CT ANGIOGRAPHY CHEST WITH CONTRAST
TECHNIQUE: Multidetector CT imaging of the chest was performed using the
standard protocol during bolus administration of intravenous
contrast. Multiplanar CT image reconstructions and MIPs were
obtained to evaluate the vascular anatomy.
CONTRAST:  100mL OMNIPAQUE IOHEXOL 350 MG/ML SOLN

[Series 5: thins · axial · 0.74mm/px · z∈[+1190,+1476]mm · 17 of 322 slices shown]
[im 18/322  lung]
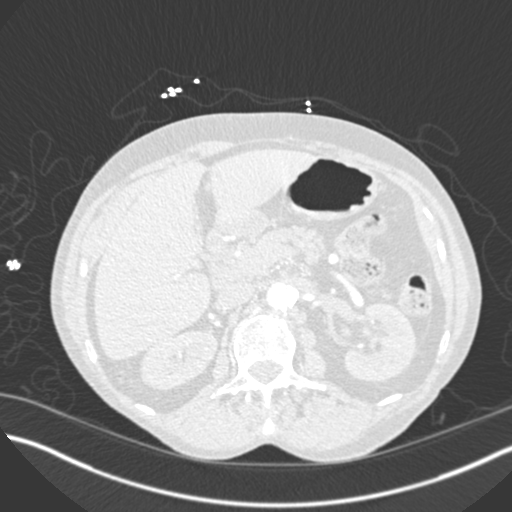
[im 36/322  mediastinal]
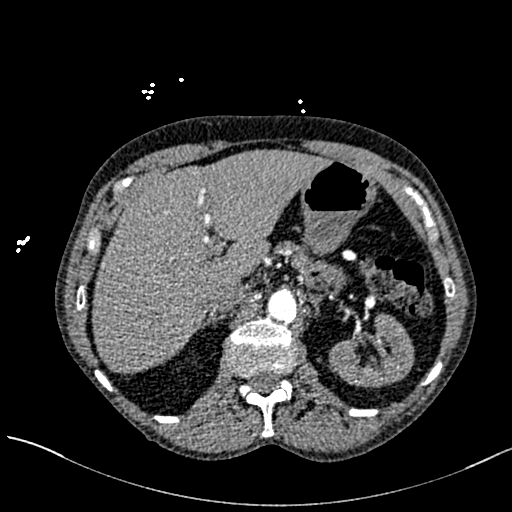
[im 54/322  lung]
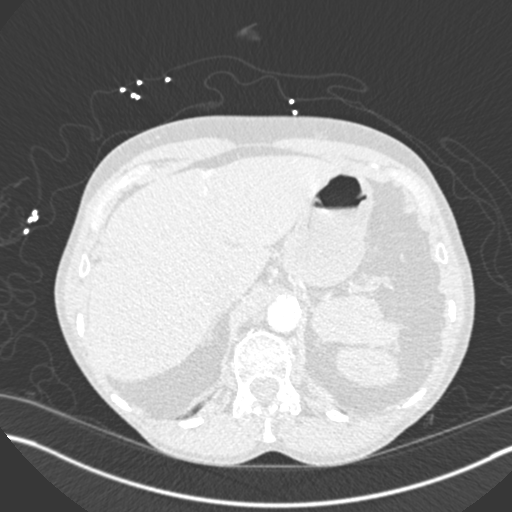
[im 72/322  mediastinal]
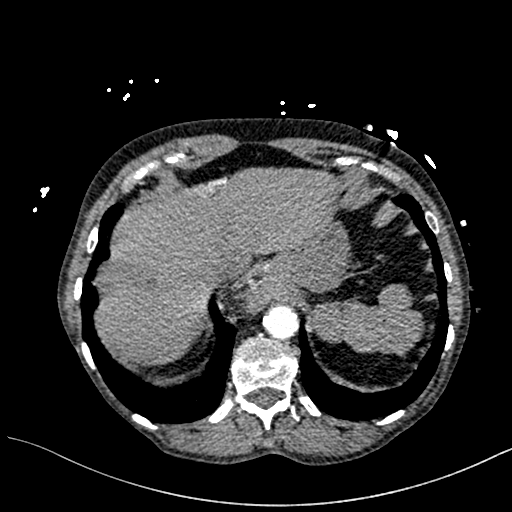
[im 90/322  lung]
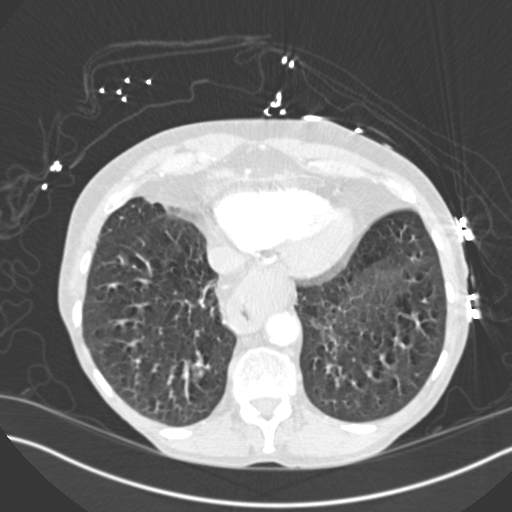
[im 108/322  mediastinal]
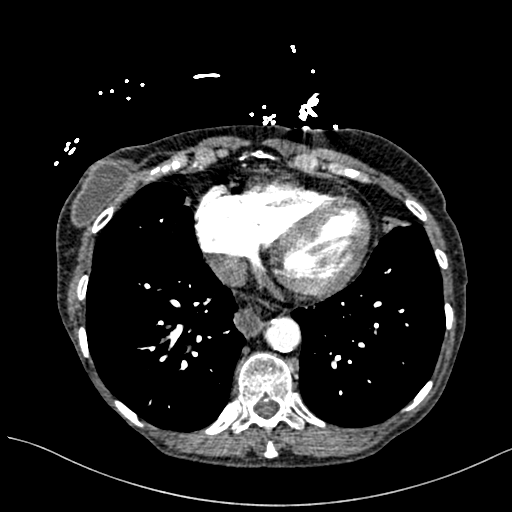
[im 125/322  lung]
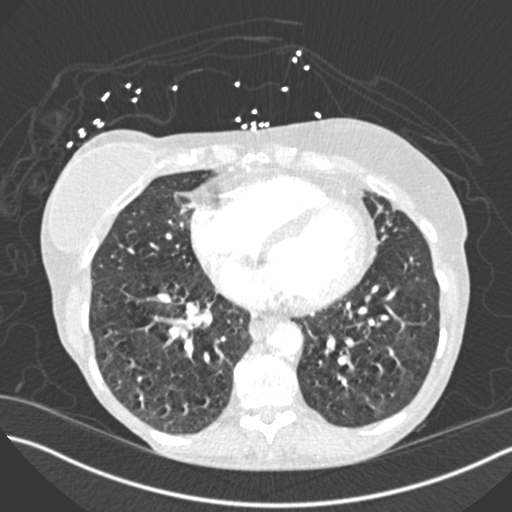
[im 143/322  mediastinal]
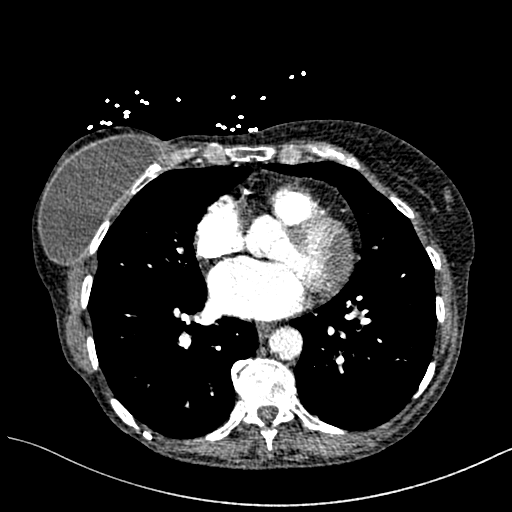
[im 161/322  lung]
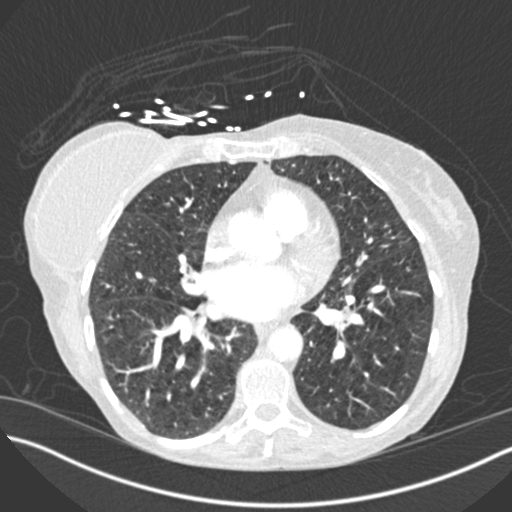
[im 179/322  mediastinal]
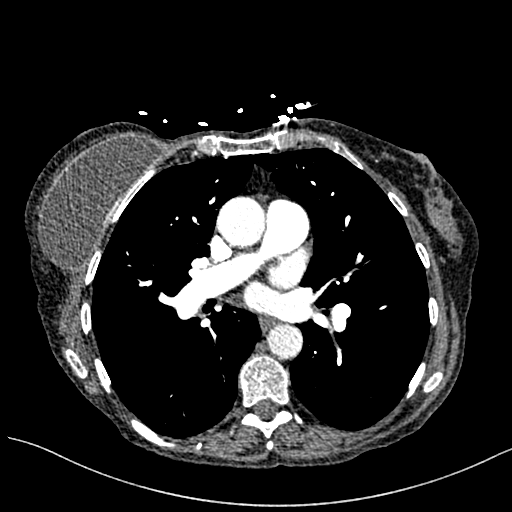
[im 197/322  lung]
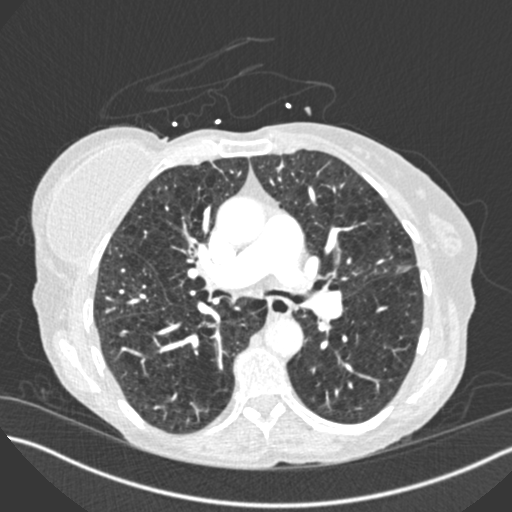
[im 215/322  mediastinal]
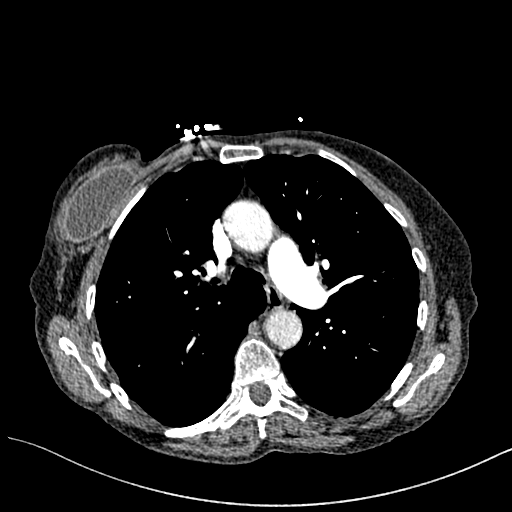
[im 232/322  lung]
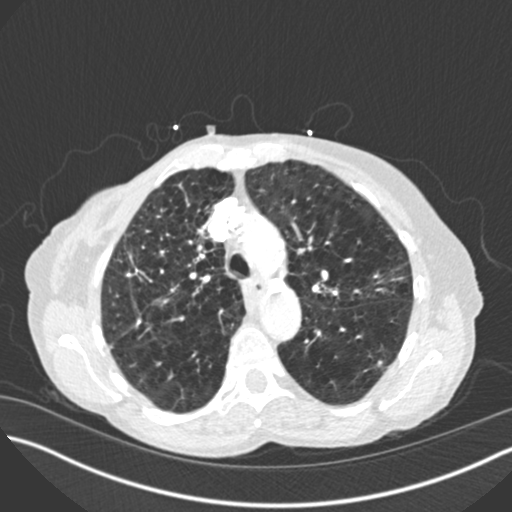
[im 250/322  mediastinal]
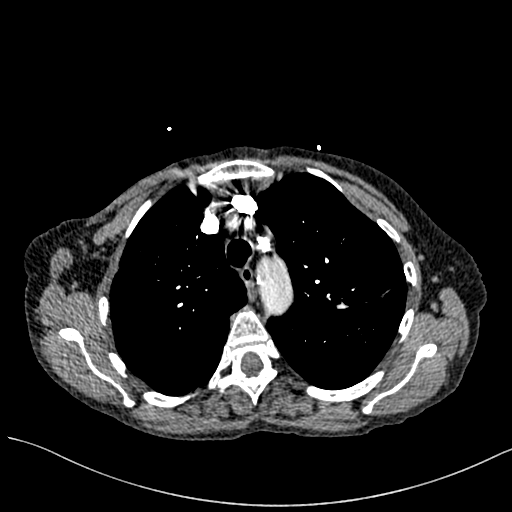
[im 268/322  lung]
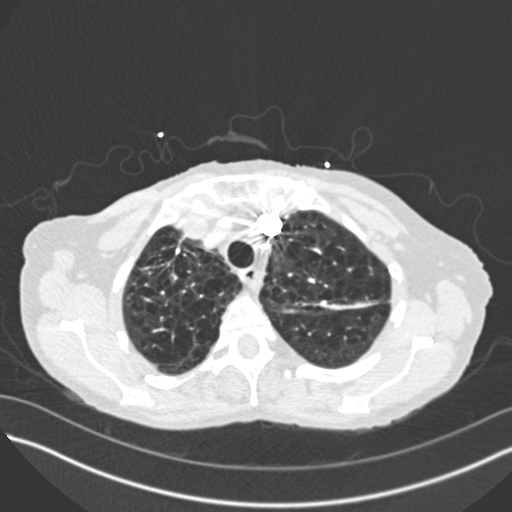
[im 286/322  mediastinal]
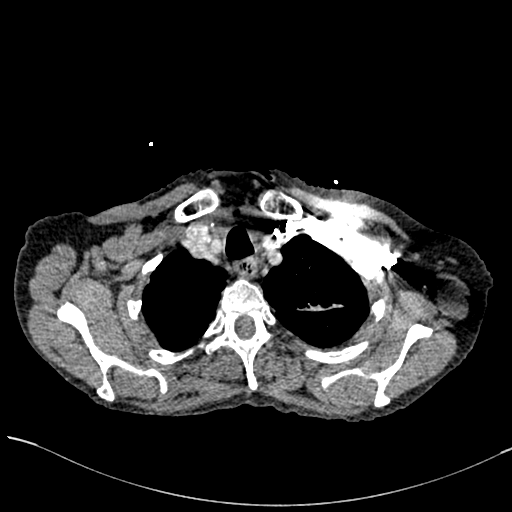
[im 304/322  lung]
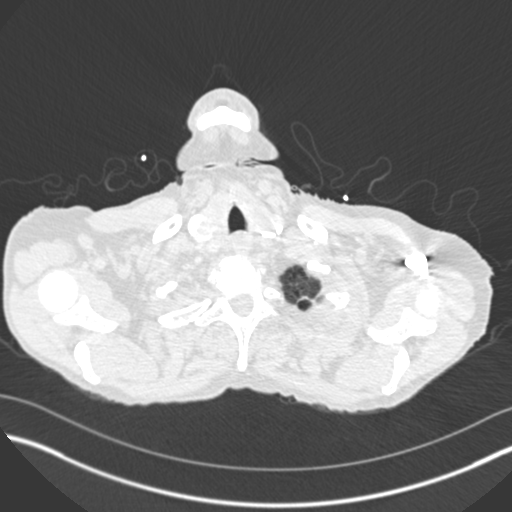

[Series 7: coronal mpr · coronal · 0.65mm/px · 1 of 150 slices shown]
[im 75/150  mediastinal]
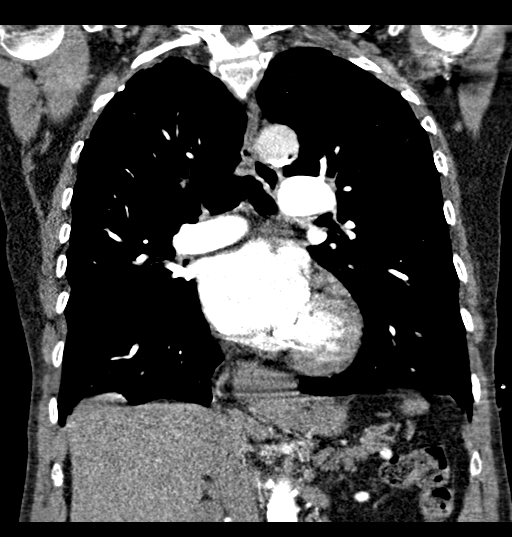

[18 of 36 positions shown; findings below may reference images not displayed]

FINDINGS: Cardiovascular: There is marked severity calcification of the aortic
arch with an extensive amount of calcification seen within the
origin of the left common carotid artery. Satisfactory opacification
of the pulmonary arteries to the segmental level. No evidence of
pulmonary embolism. Normal heart size. No pericardial effusion.
Moderate severity coronary artery calcification is noted

Mediastinum/Nodes: No enlarged mediastinal, hilar, or axillary lymph
nodes. The thyroid gland and trachea demonstrate no significant
findings. There is a large hiatal hernia.

Lungs/Pleura: There is marked severity emphysematous lung disease.

Ill-defined surgical sutures are seen along the anterior aspect of
the right upper lobe.

Mild linear scarring is seen within the posterior aspect of the left
upper lobe.

There is no evidence of acute infiltrate, pleural effusion or
pneumothorax.

Upper Abdomen: No acute abnormality.

Musculoskeletal: A right breast implant is seen.

Multilevel degenerative changes seen throughout the thoracic spine.

Review of the MIP images confirms the above findings.
IMPRESSION: 1. No evidence of pulmonary embolus.
2. Marked severity emphysematous lung disease.
3. Large hiatal hernia.

Aortic Atherosclerosis (F58N2-AHB.B) and Emphysema ([id number].Q).

## 2021-10-20 ENCOUNTER — Other Ambulatory Visit: Payer: Self-pay | Admitting: Primary Care

## 2021-11-01 ENCOUNTER — Emergency Department (HOSPITAL_COMMUNITY): Payer: Medicare Other

## 2021-11-01 ENCOUNTER — Other Ambulatory Visit: Payer: Self-pay

## 2021-11-01 ENCOUNTER — Inpatient Hospital Stay (HOSPITAL_COMMUNITY)
Admission: EM | Admit: 2021-11-01 | Discharge: 2021-11-07 | DRG: 190 | Disposition: A | Discharge status: Home / Self Care | Payer: Medicare Other | Attending: Internal Medicine | Admitting: Internal Medicine

## 2021-11-01 ENCOUNTER — Encounter (HOSPITAL_COMMUNITY): Payer: Self-pay | Admitting: Emergency Medicine

## 2021-11-01 DIAGNOSIS — Z79890 Hormone replacement therapy (postmenopausal): Secondary | ICD-10-CM

## 2021-11-01 DIAGNOSIS — Z803 Family history of malignant neoplasm of breast: Secondary | ICD-10-CM

## 2021-11-01 DIAGNOSIS — E039 Hypothyroidism, unspecified: Secondary | ICD-10-CM | POA: Diagnosis present

## 2021-11-01 DIAGNOSIS — K529 Noninfective gastroenteritis and colitis, unspecified: Secondary | ICD-10-CM | POA: Diagnosis present

## 2021-11-01 DIAGNOSIS — Z9011 Acquired absence of right breast and nipple: Secondary | ICD-10-CM

## 2021-11-01 DIAGNOSIS — Z8049 Family history of malignant neoplasm of other genital organs: Secondary | ICD-10-CM

## 2021-11-01 DIAGNOSIS — G4733 Obstructive sleep apnea (adult) (pediatric): Secondary | ICD-10-CM | POA: Diagnosis present

## 2021-11-01 DIAGNOSIS — Z7952 Long term (current) use of systemic steroids: Secondary | ICD-10-CM

## 2021-11-01 DIAGNOSIS — Z833 Family history of diabetes mellitus: Secondary | ICD-10-CM

## 2021-11-01 DIAGNOSIS — I10 Essential (primary) hypertension: Secondary | ICD-10-CM | POA: Diagnosis present

## 2021-11-01 DIAGNOSIS — J9621 Acute and chronic respiratory failure with hypoxia: Secondary | ICD-10-CM | POA: Diagnosis present

## 2021-11-01 DIAGNOSIS — Z853 Personal history of malignant neoplasm of breast: Secondary | ICD-10-CM

## 2021-11-01 DIAGNOSIS — R0602 Shortness of breath: Secondary | ICD-10-CM | POA: Diagnosis not present

## 2021-11-01 DIAGNOSIS — Z9841 Cataract extraction status, right eye: Secondary | ICD-10-CM

## 2021-11-01 DIAGNOSIS — E785 Hyperlipidemia, unspecified: Secondary | ICD-10-CM | POA: Diagnosis present

## 2021-11-01 DIAGNOSIS — I5022 Chronic systolic (congestive) heart failure: Secondary | ICD-10-CM | POA: Diagnosis present

## 2021-11-01 DIAGNOSIS — Z808 Family history of malignant neoplasm of other organs or systems: Secondary | ICD-10-CM

## 2021-11-01 DIAGNOSIS — Z8249 Family history of ischemic heart disease and other diseases of the circulatory system: Secondary | ICD-10-CM

## 2021-11-01 DIAGNOSIS — Z882 Allergy status to sulfonamides status: Secondary | ICD-10-CM

## 2021-11-01 DIAGNOSIS — I2510 Atherosclerotic heart disease of native coronary artery without angina pectoris: Secondary | ICD-10-CM | POA: Diagnosis present

## 2021-11-01 DIAGNOSIS — R188 Other ascites: Secondary | ICD-10-CM

## 2021-11-01 DIAGNOSIS — G4730 Sleep apnea, unspecified: Secondary | ICD-10-CM | POA: Diagnosis present

## 2021-11-01 DIAGNOSIS — Z7982 Long term (current) use of aspirin: Secondary | ICD-10-CM

## 2021-11-01 DIAGNOSIS — I5042 Chronic combined systolic (congestive) and diastolic (congestive) heart failure: Secondary | ICD-10-CM | POA: Diagnosis present

## 2021-11-01 DIAGNOSIS — Z8041 Family history of malignant neoplasm of ovary: Secondary | ICD-10-CM

## 2021-11-01 DIAGNOSIS — Z7951 Long term (current) use of inhaled steroids: Secondary | ICD-10-CM

## 2021-11-01 DIAGNOSIS — M797 Fibromyalgia: Secondary | ICD-10-CM | POA: Diagnosis present

## 2021-11-01 DIAGNOSIS — J439 Emphysema, unspecified: Principal | ICD-10-CM | POA: Diagnosis present

## 2021-11-01 DIAGNOSIS — Z888 Allergy status to other drugs, medicaments and biological substances status: Secondary | ICD-10-CM

## 2021-11-01 DIAGNOSIS — Z974 Presence of external hearing-aid: Secondary | ICD-10-CM

## 2021-11-01 DIAGNOSIS — I110 Hypertensive heart disease with heart failure: Secondary | ICD-10-CM | POA: Diagnosis present

## 2021-11-01 DIAGNOSIS — Z825 Family history of asthma and other chronic lower respiratory diseases: Secondary | ICD-10-CM

## 2021-11-01 DIAGNOSIS — G8929 Other chronic pain: Secondary | ICD-10-CM | POA: Diagnosis present

## 2021-11-01 DIAGNOSIS — M549 Dorsalgia, unspecified: Secondary | ICD-10-CM | POA: Diagnosis present

## 2021-11-01 DIAGNOSIS — Z800 Family history of malignant neoplasm of digestive organs: Secondary | ICD-10-CM

## 2021-11-01 DIAGNOSIS — Z9981 Dependence on supplemental oxygen: Secondary | ICD-10-CM

## 2021-11-01 DIAGNOSIS — Z9842 Cataract extraction status, left eye: Secondary | ICD-10-CM

## 2021-11-01 DIAGNOSIS — Z20822 Contact with and (suspected) exposure to covid-19: Secondary | ICD-10-CM | POA: Diagnosis present

## 2021-11-01 DIAGNOSIS — K219 Gastro-esophageal reflux disease without esophagitis: Secondary | ICD-10-CM | POA: Diagnosis present

## 2021-11-01 DIAGNOSIS — I739 Peripheral vascular disease, unspecified: Secondary | ICD-10-CM | POA: Diagnosis present

## 2021-11-01 DIAGNOSIS — J441 Chronic obstructive pulmonary disease with (acute) exacerbation: Secondary | ICD-10-CM | POA: Diagnosis present

## 2021-11-01 DIAGNOSIS — Z87891 Personal history of nicotine dependence: Secondary | ICD-10-CM

## 2021-11-01 DIAGNOSIS — Z66 Do not resuscitate: Secondary | ICD-10-CM | POA: Diagnosis present

## 2021-11-01 DIAGNOSIS — Z79899 Other long term (current) drug therapy: Secondary | ICD-10-CM

## 2021-11-01 DIAGNOSIS — Z7902 Long term (current) use of antithrombotics/antiplatelets: Secondary | ICD-10-CM

## 2021-11-01 LAB — BASIC METABOLIC PANEL
Anion gap: 13 (ref 5–15)
BUN: 15 mg/dL (ref 8–23)
CO2: 25 mmol/L (ref 22–32)
Calcium: 8.5 mg/dL — ABNORMAL LOW (ref 8.9–10.3)
Chloride: 97 mmol/L — ABNORMAL LOW (ref 98–111)
Creatinine, Ser: 0.9 mg/dL (ref 0.44–1.00)
GFR, Estimated: >60 mL/min (ref >60)
Glucose, Bld: 128 mg/dL — ABNORMAL HIGH (ref 70–99)
Potassium: 3.2 mmol/L — ABNORMAL LOW (ref 3.5–5.1)
Sodium: 135 mmol/L (ref 135–145)

## 2021-11-01 LAB — CBC WITH DIFFERENTIAL/PLATELET
Abs Immature Granulocytes: 0.09 10*3/uL — ABNORMAL HIGH (ref 0.00–0.07)
Basophils Absolute: 0.1 10*3/uL (ref 0.0–0.1)
Basophils Relative: 1 %
Eosinophils Absolute: 0.1 10*3/uL (ref 0.0–0.5)
Eosinophils Relative: 2 %
HCT: 38.5 % (ref 36.0–46.0)
Hemoglobin: 12.5 g/dL (ref 12.0–15.0)
Immature Granulocytes: 1 %
Lymphocytes Relative: 13 %
Lymphs Abs: 0.8 10*3/uL (ref 0.7–4.0)
MCH: 30.2 pg (ref 26.0–34.0)
MCHC: 32.5 g/dL (ref 30.0–36.0)
MCV: 93 fL (ref 80.0–100.0)
Monocytes Absolute: 0.2 10*3/uL (ref 0.1–1.0)
Monocytes Relative: 3 %
Neutro Abs: 5.4 10*3/uL (ref 1.7–7.7)
Neutrophils Relative %: 80 %
Platelets: 359 10*3/uL (ref 150–400)
RBC: 4.14 MIL/uL (ref 3.87–5.11)
RDW: 16.8 % — ABNORMAL HIGH (ref 11.5–15.5)
WBC: 6.7 10*3/uL (ref 4.0–10.5)
nRBC: 0 % (ref 0.0–0.2)

## 2021-11-01 LAB — RESP PANEL BY RT-PCR (FLU A&B, COVID) ARPGX2
Influenza A by PCR: NEGATIVE
Influenza B by PCR: NEGATIVE
SARS Coronavirus 2 by RT PCR: NEGATIVE

## 2021-11-01 NOTE — ED Triage Notes (Signed)
Patient comes from home by [facility]. Patient complaining of sob with wheezing. Nasal Canula as needed. Patient has been using all day for the past three days. Patient states that this has been going on for 3 weeks. EMS has given 2 duo nebs, .3 of epi, 125 of solumedrol, 2 g of Mag. Piv 18 in left wrist. Patient is currently on non rebreather  O2 sat 97% and Sinus rhythm. Right arm restriction due to mastectomy.

## 2021-11-01 NOTE — ED Provider Notes (Signed)
**[name] De-Identified via Obfuscation** Christy [name]   CSN: [id number] Arrival date & time: 11/01/21  2204     History  Chief Complaint  Patient presents with   Shortness of Breath    Christy [name] is a 76 y.o. female.  76 year old female with prior medical history as detailed below presents for evaluation.  Patient arrives by EMS.  Patient reports increased shortness of breath and wheezing for the last 48 to 72 hours.  Patient reports that she does have supplemental O2 at home that she uses intermittently as needed.  She reports using her supplemental O2 more over the last 36 hours.  EMS reports the patient was in significant distress on their initial evaluation.  She has been given 2 DuoNeb treatments, 0.3 mg of epi, 125 mg of Solu-Medrol, 2 g of magnesium, during transport.  Patient reports that she feels significantly improved.  She is speaking in full sentences.    The history is provided by the patient.  Shortness of Breath Severity:  Moderate Onset quality:  Gradual Duration:  3 days Timing:  Constant Progression:  Worsening Chronicity:  New Context: activity   Relieved by:  Nothing     Home Medications Prior to Admission medications   Medication Sig Start Date End Date Taking? Authorizing Provider  albuterol (VENTOLIN HFA) 108 (90 Base) MCG/ACT inhaler TAKE 2 PUFFS BY MOUTH EVERY 6 HOURS AS NEEDED FOR WHEEZE OR SHORTNESS OF BREATH Patient taking differently: Inhale 1 puff into the lungs every 4 (four) hours as needed for wheezing or shortness of breath. 09/05/20   Martyn Ehrich, NP  aspirin EC 81 MG tablet Take 1 tablet (81 mg total) by mouth daily. 08/21/20   Cherene Altes, MD  atorvastatin (LIPITOR) 80 MG tablet Take 1 tablet (80 mg total) by mouth daily. 06/15/21   Freada Bergeron, MD  budesonide (PULMICORT) 0.5 MG/2ML nebulizer solution Take 2 mLs (0.5 mg total) by nebulization 2 (two) times daily. 09/15/20   Elie Confer, MD  cilostazol  (PLETAL) 100 MG tablet TAKE 1 TABLET BY MOUTH TWICE A DAY Patient taking differently: Take 100 mg by mouth 2 (two) times daily. 11/28/20   [name], NP  clopidogrel (PLAVIX) 75 MG tablet TAKE 1 TABLET BY MOUTH EVERY DAY Patient taking differently: Take 75 mg by mouth daily. 07/08/21   Waynetta Sandy, MD  DUPIXENT 300 MG/2ML SOPN Inject 2 mLs into the skin every 14 (fourteen) days. 01/03/21   [provider]  esomeprazole (NEXIUM) 40 MG capsule Take 1 capsule (40 mg total) by mouth daily. 05/19/21   Martin[place], [name], MD  ferrous sulfate 325 (65 FE) MG tablet TAKE 1 TABLET BY MOUTH EVERY DAY WITH BREAKFAST 09/06/21   [place], [name], MD  fluticasone [facility]) 50 MCG/ACT nasal spray SPRAY 1 SPRAY INTO EACH NOSTRIL TWICE A DAY 10/02/21   [place], [name], MD  Fluticasone-Umeclidin-Vilant (TRELEGY ELLIPTA) 200-62.5-25 MCG/INH AEPB Inhale 1 puff into the lungs daily. 07/21/21   Deneise Lever, MD  furosemide (LASIX) 40 MG tablet TAKE 1 TABLET BY MOUTH TWICE A DAY Patient taking differently: Take 40 mg by mouth 2 (two) times daily. 06/26/21   [place], [name], MD  guaiFENesin (MUCINEX) 600 MG 12 hr tablet Take 600 mg by mouth daily.    [provider]  hydrOXYzine (ATARAX/VISTARIL) 25 MG tablet Take 25 mg by mouth daily. 03/31/21   [provider]  ipratropium-albuterol (DUONEB) 0.5-2.5 (3) MG/3ML SOLN INHALE 3  MLS INTO THE LUNGS EVERY 6 (SIX) HOURS AS NEEDED (SOB, WHEEZING). 05/09/21   Martyn Ehrich, NP  KLOR-CON M20 20 MEQ tablet TAKE 1 TABLET BY MOUTH THREE TIMES A DAY 08/23/21   [place], [name], MD  levothyroxine (SYNTHROID) 112 MCG tablet TAKE 1 TABLET BY MOUTH EVERY DAY IN THE MORNING BEFORE BREAKFAST 09/15/21   [place], [name], MD  LORazepam (ATIVAN) 2 MG tablet TAKE 1 TABLET BY MOUTH AT BEDTIME. 09/27/21   [place], [name], MD  montelukast (SINGULAIR) 10 MG tablet TAKE 1 TABLET BY MOUTH EVERYDAY AT BEDTIME Patient taking differently: Take 10 mg by mouth at  bedtime. 04/21/21   Martyn Ehrich, NP  PARoxetine (PAXIL) 20 MG tablet TAKE 1 TABLET BY MOUTH EVERYDAY AT BEDTIME Patient taking differently: Take 20 mg by mouth daily. 02/27/21   [place], [name], MD  predniSONE (DELTASONE) 10 MG tablet Take q dqy 6,5,4,3,2,1 09/05/21   Daleen Bo, MD  tamsulosin (FLOMAX) 0.4 MG CAPS capsule TAKE 1 CAPSULE BY MOUTH EVERY DAY 09/25/21   [place], [name], MD  traZODone (DESYREL) 100 MG tablet TAKE 1 TABLET BY MOUTH EVERYDAY AT BEDTIME Patient taking differently: Take 100 mg by mouth at bedtime. 06/20/21   [place], [name], MD      Allergies    Sinequan [doxepin], Sulfa antibiotics, and Sulfur    Review of Systems   Review of Systems  Respiratory:  Positive for shortness of breath.   All other systems reviewed and are negative.  Physical Exam Updated Vital Signs BP 121/67 (BP Location: Right Arm)    Pulse 91    Temp 97.9 F (36.6 C)    Resp 20    Ht 5\' 6"  (1.676 m)    Wt 61.2 kg    SpO2 97%    BMI 21.79 kg/m  Physical Exam Vitals and nursing [name] reviewed.  Constitutional:      General: She is not in acute distress.    Appearance: Normal appearance. She is well-developed.  HENT:     Head: Normocephalic and atraumatic.  Eyes:     Conjunctiva/sclera: Conjunctivae normal.     Pupils: Pupils are equal, round, and reactive to light.  Cardiovascular:     Rate and Rhythm: Normal rate and regular rhythm.     Heart sounds: Normal heart sounds.  Pulmonary:     Effort: Pulmonary effort is normal. No respiratory distress.     Breath sounds: Decreased breath sounds present.     Comments: Scattered diffuse expiratory wheezes in all fields  Speaking in full sentences Abdominal:     General: There is no distension.     Palpations: Abdomen is soft.     Tenderness: There is no abdominal tenderness.  Musculoskeletal:        General: No deformity. Normal range of motion.     Cervical back: Normal range of motion and neck supple.  Skin:    General: Skin is  warm and dry.  Neurological:     General: No focal deficit present.     Mental Status: She is alert and oriented to person, place, and time.    ED Results / Procedures / Treatments   Labs (all labs ordered are listed, but only abnormal results are displayed) Labs Reviewed  CBC WITH DIFFERENTIAL/PLATELET - Abnormal; Notable for the following components:      Result Value   RDW 16.8 (*)    Abs Immature Granulocytes 0.09 (*)    All other components within  normal limits  RESP PANEL BY RT-PCR (FLU A&B, COVID) [id number]  BASIC METABOLIC PANEL    EKG None  Radiology No results found.  Procedures Procedures    Medications Ordered in ED Medications - No data to display  ED Course/ Medical Decision Making/ A&P                           Medical Decision Making Amount and/or Complexity of Data Reviewed Labs: ordered. Radiology: ordered.    Medical Screen Complete  This patient presented to the ED with complaint of shortness of breath and wheezing.  This complaint involves an extensive number of treatment options. The initial differential diagnosis includes, but is not limited to, COPD exacerbation, pneumonia, bacterial infection, viral infection, metabolic abnormality  This presentation is: Acute, Chronic, Previously Undiagnosed, Uncertain Prognosis, Complicated, Systemic Symptoms, and Threat to Life/Bodily Function  Patient presented with complaint of increased shortness of breath and wheezing for the last 2 to 3 days.  Patient with significant distress upon initial EMS evaluation.  Patient does appear to be improved upon arrival to the ED.  Patient would likely benefit from admission for further work-up and treatment of likely COPD exacerbation.   Co morbidities that complicated the patient's evaluation  Advanced age, COPD, hx of CHF, hx of CAD, hx of Cancer   Additional history obtained:  Additional history obtained from EMS External records from outside sources  obtained and reviewed including prior ED visits and prior Inpatient records.    Lab Tests:  I ordered and personally interpreted labs.  The pertinent results include: CBC, BMP, COVID   Imaging Studies ordered:  I ordered imaging studies including chest x-ray I independently visualized and interpreted obtained imaging which showed no acute pathology I agree with the radiologist interpretation.   Cardiac Monitoring:  The patient was maintained on a cardiac monitor.  I personally viewed and interpreted the cardiac monitor which showed an underlying rhythm of: Sinus rhythm      Consultations Obtained:  Hospitalist service aware of case  and will evaluate for admission.   Problem List / ED Course:  COPD exacerbation   Reevaluation:  After the interventions noted above, I reevaluated the patient and found that they have: improved    Disposition:  After consideration of the diagnostic results and the patients response to treatment, I feel that the patent would benefit from admission.          Final Clinical Impression(s) / ED Diagnoses Final diagnoses:  SOB (shortness of breath)    Rx / DC Orders ED Discharge Orders     None         Valarie Merino, MD 11/01/21 2315

## 2021-11-02 ENCOUNTER — Encounter (HOSPITAL_COMMUNITY): Payer: Self-pay | Admitting: Internal Medicine

## 2021-11-02 DIAGNOSIS — E039 Hypothyroidism, unspecified: Secondary | ICD-10-CM | POA: Diagnosis present

## 2021-11-02 DIAGNOSIS — R0602 Shortness of breath: Secondary | ICD-10-CM | POA: Diagnosis present

## 2021-11-02 DIAGNOSIS — K529 Noninfective gastroenteritis and colitis, unspecified: Secondary | ICD-10-CM | POA: Diagnosis present

## 2021-11-02 DIAGNOSIS — I5022 Chronic systolic (congestive) heart failure: Secondary | ICD-10-CM | POA: Diagnosis not present

## 2021-11-02 DIAGNOSIS — J9621 Acute and chronic respiratory failure with hypoxia: Secondary | ICD-10-CM | POA: Diagnosis present

## 2021-11-02 DIAGNOSIS — Z882 Allergy status to sulfonamides status: Secondary | ICD-10-CM | POA: Diagnosis not present

## 2021-11-02 DIAGNOSIS — Z8041 Family history of malignant neoplasm of ovary: Secondary | ICD-10-CM | POA: Diagnosis not present

## 2021-11-02 DIAGNOSIS — G4733 Obstructive sleep apnea (adult) (pediatric): Secondary | ICD-10-CM | POA: Diagnosis present

## 2021-11-02 DIAGNOSIS — I2510 Atherosclerotic heart disease of native coronary artery without angina pectoris: Secondary | ICD-10-CM | POA: Diagnosis present

## 2021-11-02 DIAGNOSIS — I10 Essential (primary) hypertension: Secondary | ICD-10-CM | POA: Diagnosis not present

## 2021-11-02 DIAGNOSIS — Z888 Allergy status to other drugs, medicaments and biological substances status: Secondary | ICD-10-CM | POA: Diagnosis not present

## 2021-11-02 DIAGNOSIS — Z66 Do not resuscitate: Secondary | ICD-10-CM | POA: Diagnosis present

## 2021-11-02 DIAGNOSIS — Z974 Presence of external hearing-aid: Secondary | ICD-10-CM | POA: Diagnosis not present

## 2021-11-02 DIAGNOSIS — Z803 Family history of malignant neoplasm of breast: Secondary | ICD-10-CM | POA: Diagnosis not present

## 2021-11-02 DIAGNOSIS — I110 Hypertensive heart disease with heart failure: Secondary | ICD-10-CM | POA: Diagnosis present

## 2021-11-02 DIAGNOSIS — K219 Gastro-esophageal reflux disease without esophagitis: Secondary | ICD-10-CM | POA: Diagnosis present

## 2021-11-02 DIAGNOSIS — Z808 Family history of malignant neoplasm of other organs or systems: Secondary | ICD-10-CM | POA: Diagnosis not present

## 2021-11-02 DIAGNOSIS — J441 Chronic obstructive pulmonary disease with (acute) exacerbation: Secondary | ICD-10-CM

## 2021-11-02 DIAGNOSIS — E038 Other specified hypothyroidism: Secondary | ICD-10-CM | POA: Diagnosis not present

## 2021-11-02 DIAGNOSIS — Z20822 Contact with and (suspected) exposure to covid-19: Secondary | ICD-10-CM | POA: Diagnosis present

## 2021-11-02 DIAGNOSIS — I739 Peripheral vascular disease, unspecified: Secondary | ICD-10-CM | POA: Diagnosis present

## 2021-11-02 DIAGNOSIS — J439 Emphysema, unspecified: Secondary | ICD-10-CM | POA: Diagnosis present

## 2021-11-02 DIAGNOSIS — Z800 Family history of malignant neoplasm of digestive organs: Secondary | ICD-10-CM | POA: Diagnosis not present

## 2021-11-02 DIAGNOSIS — I5042 Chronic combined systolic (congestive) and diastolic (congestive) heart failure: Secondary | ICD-10-CM | POA: Diagnosis present

## 2021-11-02 DIAGNOSIS — M549 Dorsalgia, unspecified: Secondary | ICD-10-CM | POA: Diagnosis present

## 2021-11-02 DIAGNOSIS — Z853 Personal history of malignant neoplasm of breast: Secondary | ICD-10-CM | POA: Diagnosis not present

## 2021-11-02 DIAGNOSIS — E785 Hyperlipidemia, unspecified: Secondary | ICD-10-CM | POA: Diagnosis present

## 2021-11-02 DIAGNOSIS — M797 Fibromyalgia: Secondary | ICD-10-CM | POA: Diagnosis present

## 2021-11-02 DIAGNOSIS — G8929 Other chronic pain: Secondary | ICD-10-CM | POA: Diagnosis present

## 2021-11-02 DIAGNOSIS — K2100 Gastro-esophageal reflux disease with esophagitis, without bleeding: Secondary | ICD-10-CM | POA: Diagnosis not present

## 2021-11-02 LAB — BASIC METABOLIC PANEL
Anion gap: 11 (ref 5–15)
BUN: 18 mg/dL (ref 8–23)
CO2: 24 mmol/L (ref 22–32)
Calcium: 8.7 mg/dL — ABNORMAL LOW (ref 8.9–10.3)
Chloride: 99 mmol/L (ref 98–111)
Creatinine, Ser: 0.86 mg/dL (ref 0.44–1.00)
GFR, Estimated: >60 mL/min (ref >60)
Glucose, Bld: 183 mg/dL — ABNORMAL HIGH (ref 70–99)
Potassium: 4 mmol/L (ref 3.5–5.1)
Sodium: 134 mmol/L — ABNORMAL LOW (ref 135–145)

## 2021-11-02 LAB — RESPIRATORY PANEL BY PCR

## 2021-11-02 LAB — CREATININE, SERUM
Creatinine, Ser: 0.96 mg/dL (ref 0.44–1.00)
GFR, Estimated: >60 mL/min (ref >60)

## 2021-11-02 LAB — TROPONIN I (HIGH SENSITIVITY)
Troponin I (High Sensitivity): 7 ng/L (ref <18)
Troponin I (High Sensitivity): 7 ng/L (ref <18)
Troponin I (High Sensitivity): 5 ng/L (ref <18)

## 2021-11-02 LAB — CBC
HCT: 38.3 % (ref 36.0–46.0)
Hemoglobin: 12.6 g/dL (ref 12.0–15.0)
MCH: 30.2 pg (ref 26.0–34.0)
MCHC: 32.9 g/dL (ref 30.0–36.0)
MCV: 91.8 fL (ref 80.0–100.0)
Platelets: 378 10*3/uL (ref 150–400)
RBC: 4.17 MIL/uL (ref 3.87–5.11)
RDW: 16.9 % — ABNORMAL HIGH (ref 11.5–15.5)
WBC: 4.2 10*3/uL (ref 4.0–10.5)
nRBC: 0 % (ref 0.0–0.2)

## 2021-11-02 LAB — BRAIN NATRIURETIC PEPTIDE: B Natriuretic Peptide: 123.9 pg/mL — ABNORMAL HIGH (ref 0.0–100.0)

## 2021-11-02 MED ORDER — MONTELUKAST SODIUM 10 MG PO TABS
10.0000 mg | ORAL_TABLET | Freq: Every day | ORAL | Status: DC
  Administered 2021-11-02 – 2021-11-06 (×5): 10 mg via ORAL
  Filled 2021-11-02 (×6): qty 1

## 2021-11-02 MED ORDER — POTASSIUM CHLORIDE CRYS ER 20 MEQ PO TBCR
20.0000 meq | EXTENDED_RELEASE_TABLET | Freq: Three times a day (TID) | ORAL | Status: DC
  Administered 2021-11-02 – 2021-11-07 (×16): 20 meq via ORAL
  Filled 2021-11-02 (×16): qty 1

## 2021-11-02 MED ORDER — TRAZODONE HCL 50 MG PO TABS
100.0000 mg | ORAL_TABLET | Freq: Every day | ORAL | Status: DC
  Administered 2021-11-02 – 2021-11-06 (×5): 100 mg via ORAL
  Filled 2021-11-02 (×5): qty 2

## 2021-11-02 MED ORDER — SODIUM CHLORIDE 0.9 % IV SOLN
500.0000 mg | INTRAVENOUS | Status: AC
  Administered 2021-11-02: 500 mg via INTRAVENOUS
  Filled 2021-11-02: qty 5

## 2021-11-02 MED ORDER — CILOSTAZOL 100 MG PO TABS
100.0000 mg | ORAL_TABLET | Freq: Two times a day (BID) | ORAL | Status: DC
  Administered 2021-11-02 – 2021-11-07 (×11): 100 mg via ORAL
  Filled 2021-11-02 (×12): qty 1

## 2021-11-02 MED ORDER — FERROUS SULFATE 325 (65 FE) MG PO TABS
325.0000 mg | ORAL_TABLET | Freq: Every day | ORAL | Status: DC
  Administered 2021-11-02 – 2021-11-07 (×6): 325 mg via ORAL
  Filled 2021-11-02 (×6): qty 1

## 2021-11-02 MED ORDER — BUDESONIDE 0.5 MG/2ML IN SUSP
0.5000 mg | Freq: Two times a day (BID) | RESPIRATORY_TRACT | Status: DC
  Administered 2021-11-02 – 2021-11-07 (×11): 0.5 mg via RESPIRATORY_TRACT
  Filled 2021-11-02 (×11): qty 2

## 2021-11-02 MED ORDER — TAMSULOSIN HCL 0.4 MG PO CAPS
0.4000 mg | ORAL_CAPSULE | Freq: Every day | ORAL | Status: DC
  Administered 2021-11-02 – 2021-11-07 (×6): 0.4 mg via ORAL
  Filled 2021-11-02 (×6): qty 1

## 2021-11-02 MED ORDER — CLOPIDOGREL BISULFATE 75 MG PO TABS
75.0000 mg | ORAL_TABLET | Freq: Every day | ORAL | Status: DC
  Administered 2021-11-02 – 2021-11-07 (×6): 75 mg via ORAL
  Filled 2021-11-02 (×6): qty 1

## 2021-11-02 MED ORDER — IPRATROPIUM-ALBUTEROL 0.5-2.5 (3) MG/3ML IN SOLN
3.0000 mL | RESPIRATORY_TRACT | Status: DC
  Administered 2021-11-02 – 2021-11-03 (×9): 3 mL via RESPIRATORY_TRACT
  Filled 2021-11-02 (×9): qty 3

## 2021-11-02 MED ORDER — LORAZEPAM 1 MG PO TABS
2.0000 mg | ORAL_TABLET | Freq: Every day | ORAL | Status: DC
  Administered 2021-11-02 – 2021-11-06 (×5): 2 mg via ORAL
  Filled 2021-11-02 (×5): qty 2

## 2021-11-02 MED ORDER — PAROXETINE HCL 20 MG PO TABS
20.0000 mg | ORAL_TABLET | Freq: Every day | ORAL | Status: DC
  Administered 2021-11-02 – 2021-11-06 (×5): 20 mg via ORAL
  Filled 2021-11-02 (×5): qty 1

## 2021-11-02 MED ORDER — ACETAMINOPHEN 325 MG PO TABS
650.0000 mg | ORAL_TABLET | Freq: Four times a day (QID) | ORAL | Status: DC | PRN
  Administered 2021-11-02 (×2): 650 mg via ORAL
  Filled 2021-11-02 (×2): qty 2

## 2021-11-02 MED ORDER — POTASSIUM CHLORIDE CRYS ER 20 MEQ PO TBCR
20.0000 meq | EXTENDED_RELEASE_TABLET | Freq: Once | ORAL | Status: AC
  Administered 2021-11-02: 20 meq via ORAL
  Filled 2021-11-02: qty 1

## 2021-11-02 MED ORDER — ALBUTEROL SULFATE (2.5 MG/3ML) 0.083% IN NEBU: 2.5000 mg | INHALATION_SOLUTION | RESPIRATORY_TRACT | Status: DC

## 2021-11-02 MED ORDER — ASPIRIN EC 81 MG PO TBEC
81.0000 mg | DELAYED_RELEASE_TABLET | Freq: Every day | ORAL | Status: DC
  Administered 2021-11-02 – 2021-11-07 (×6): 81 mg via ORAL
  Filled 2021-11-02 (×6): qty 1

## 2021-11-02 MED ORDER — ACETAMINOPHEN 650 MG RE SUPP: 650.0000 mg | Freq: Four times a day (QID) | RECTAL | Status: DC | PRN

## 2021-11-02 MED ORDER — ATORVASTATIN CALCIUM 40 MG PO TABS
80.0000 mg | ORAL_TABLET | Freq: Every day | ORAL | Status: DC
  Administered 2021-11-02 – 2021-11-07 (×6): 80 mg via ORAL
  Filled 2021-11-02 (×6): qty 2

## 2021-11-02 MED ORDER — LEVOTHYROXINE SODIUM 112 MCG PO TABS
112.0000 ug | ORAL_TABLET | Freq: Every day | ORAL | Status: DC
  Administered 2021-11-02 – 2021-11-07 (×6): 112 ug via ORAL
  Filled 2021-11-02 (×6): qty 1

## 2021-11-02 MED ORDER — METHYLPREDNISOLONE SODIUM SUCC 40 MG IJ SOLR
40.0000 mg | Freq: Two times a day (BID) | INTRAMUSCULAR | Status: DC
  Administered 2021-11-02 – 2021-11-03 (×4): 40 mg via INTRAVENOUS
  Filled 2021-11-02 (×4): qty 1

## 2021-11-02 MED ORDER — ALBUTEROL SULFATE (2.5 MG/3ML) 0.083% IN NEBU: 2.5000 mg | INHALATION_SOLUTION | RESPIRATORY_TRACT | Status: DC | PRN

## 2021-11-02 MED ORDER — BUDESONIDE 0.5 MG/2ML IN SUSP: 0.5000 mg | Freq: Two times a day (BID) | RESPIRATORY_TRACT | Status: DC

## 2021-11-02 MED ORDER — IPRATROPIUM BROMIDE 0.02 % IN SOLN: 0.5000 mg | RESPIRATORY_TRACT | Status: DC

## 2021-11-02 MED ORDER — ENOXAPARIN SODIUM 40 MG/0.4ML IJ SOSY
40.0000 mg | PREFILLED_SYRINGE | INTRAMUSCULAR | Status: DC
  Administered 2021-11-02 – 2021-11-07 (×6): 40 mg via SUBCUTANEOUS
  Filled 2021-11-02 (×6): qty 0.4

## 2021-11-02 MED ORDER — AZITHROMYCIN 250 MG PO TABS
500.0000 mg | ORAL_TABLET | Freq: Every day | ORAL | Status: AC
  Administered 2021-11-03 – 2021-11-06 (×4): 500 mg via ORAL
  Filled 2021-11-02 (×5): qty 2

## 2021-11-02 MED ORDER — PANTOPRAZOLE SODIUM 40 MG PO TBEC
40.0000 mg | DELAYED_RELEASE_TABLET | Freq: Every day | ORAL | Status: DC
  Administered 2021-11-02 – 2021-11-03 (×2): 40 mg via ORAL
  Filled 2021-11-02 (×2): qty 1

## 2021-11-02 MED ORDER — FUROSEMIDE 40 MG PO TABS
40.0000 mg | ORAL_TABLET | Freq: Two times a day (BID) | ORAL | Status: DC
  Administered 2021-11-02 – 2021-11-07 (×11): 40 mg via ORAL
  Filled 2021-11-02 (×11): qty 1

## 2021-11-02 MED ORDER — BUDESONIDE 0.25 MG/2ML IN SUSP: 0.2500 mg | Freq: Two times a day (BID) | RESPIRATORY_TRACT | Status: DC

## 2021-11-02 MED ORDER — SODIUM CHLORIDE 0.9 % IV SOLN
2.0000 g | INTRAVENOUS | Status: DC
  Administered 2021-11-02 – 2021-11-07 (×6): 2 g via INTRAVENOUS
  Filled 2021-11-02 (×6): qty 20

## 2021-11-02 NOTE — Progress Notes (Signed)
PROGRESS NOTE    [name]  IWL:[id number] DOB: [birth year] DOA: 11/01/2021 PCP: [place], [name], MD     Brief Narrative:  [name] is a 76 y.o.WF PMHx  COPD on home oxygen, Chronic Systolic CHF last EF measured in October 2022 was 63 to 60% with grade 2 diastolic dysfunction, peripheral vascular disease status post subclavian stenting and Hx  mesenteric ischemia, hypothyroidism HLD  Presents to the ER because of worsening shortness of breath.  Patient states she has been progressively getting short of breath for the last 3 weeks and has been having cough unable to bring her phlegm.  Denies any chest pain.   ED Course: In the ER patient has been found to be diffusely wheezing with chest x-ray showing possible infiltrates.  Patient is afebrile.  EKG shows nonspecific findings.  Patient was given nebulizer steroids and admitted for further work-up.   Subjective: Patient seen earlier today by [facility] no charge    Assessment & Plan: Covid vaccination;   Principal Problem:   COPD exacerbation ([place]) Active Problems:   Hypothyroidism   Essential hypertension   Systolic CHF, chronic (HCC)      Acute COPD exacerbation on exam patient is diffusely wheezing chest x-ray showing possible infiltrates.  We will keep patient on IV Solu-Medrol Pulmicort nebulizer and also add antibiotics for possible pneumonia.  Sputum cultures. History of chronic systolic heart failure last EF in October 2022 was 55 to 60% with grade 2 diastolic dysfunction.  We will continue home dose of Lasix.  Troponins and BNP are pending.  I think patient's shortness of breath is likely from COPD. History of peripheral vascular disease status post subclavian stenting and history of mesenteric ischemia presently on aspirin Plavix Pletal and statins. Hypothyroidism on Synthroid Hyperlipidemia on statins. History of sleep apnea on 2 L oxygen at bedtime and also requires using CPAP.        DVT prophylaxis:  Lovenox Code Status: DNR Family Communication:  Status is: Inpatient    Dispo: The patient is from: Home              Anticipated d/c is to: Home              Anticipated d/c date is: > 3 days              Patient currently is not medically stable to d/c.      Consultants:    Procedures/Significant Events:    I have personally reviewed and interpreted all radiology studies and my findings are as above.  VENTILATOR SETTINGS:    Cultures   Antimicrobials: Anti-infectives (From admission, onward)    Start     Dose/Rate Route Frequency Ordered Stop   11/03/21 0600  azithromycin (ZITHROMAX) tablet 500 mg       See Hyperspace for full Linked Orders Report.   500 mg Oral Daily 11/02/21 0148 11/07/21 0959   11/02/21 0400  cefTRIAXone (ROCEPHIN) 2 g in sodium chloride 0.9 % 100 mL IVPB        2 g 200 mL/hr over 30 Minutes Intravenous Every 24 hours 11/02/21 0312     11/02/21 0200  azithromycin (ZITHROMAX) 500 mg in sodium chloride 0.9 % 250 mL IVPB       See Hyperspace for full Linked Orders Report.   500 mg 250 mL/hr over 60 Minutes Intravenous Every 24 hours 11/02/21 0148 11/02/21 0440         Devices    LINES /  TUBES:      Continuous Infusions:  cefTRIAXone (ROCEPHIN)  IV Stopped (11/02/21 0820)     Objective: Vitals:   11/02/21 1430 11/02/21 1457 11/02/21 1505 11/02/21 1619  BP: 131/81  129/72 (!) 172/91  Pulse: 84  80 (!) 108  Resp: 17  18 19   Temp:  98.6 F (37 C) 98.2 F (36.8 C) 98.2 F (36.8 C)  TempSrc:  Oral Oral Oral  SpO2: 97%  98% 90%  Weight:      Height:        Intake/Output Summary (Last 24 hours) at 11/02/2021 1818 Last data filed at 11/02/2021 1100 Gross per 24 hour  Intake 350 ml  Output 700 ml  Net -350 ml   Filed Weights   11/01/21 2229  Weight: 61.2 kg    Examination:  Patient seen earlier today by [facility] no charge   .     Data Reviewed: Care during the described time interval was provided by me .  I have  reviewed this patient's available data, including medical history, events of note, physical examination, and all test results as part of my evaluation.  CBC: Recent Labs  Lab 11/01/21 2259 11/02/21 0434  WBC 6.7 4.2  NEUTROABS 5.4  --   HGB 12.5 12.6  HCT 38.5 38.3  MCV 93.0 91.8  PLT 359 814   Basic Metabolic Panel: Recent Labs  Lab 11/01/21 2259 11/01/21 2303 11/02/21 0434  NA 135  --  134*  K 3.2*  --  4.0  CL 97*  --  99  CO2 25  --  24  GLUCOSE 128*  --  183*  BUN 15  --  18  CREATININE 0.90 0.96 0.86  CALCIUM 8.5*  --  8.7*   GFR: Estimated Creatinine Clearance: 52.1 mL/min (by C-G formula based on SCr of 0.86 mg/dL). Liver Function Tests: No results for input(s): AST, ALT, ALKPHOS, BILITOT, PROT, ALBUMIN in the last 168 hours. No results for input(s): LIPASE, AMYLASE in the last 168 hours. No results for input(s): AMMONIA in the last 168 hours. Coagulation Profile: No results for input(s): INR, PROTIME in the last 168 hours. Cardiac Enzymes: No results for input(s): CKTOTAL, CKMB, CKMBINDEX, TROPONINI in the last 168 hours. BNP (last 3 results) No results for input(s): PROBNP in the last 8760 hours. HbA1C: No results for input(s): HGBA1C in the last 72 hours. CBG: No results for input(s): GLUCAP in the last 168 hours. Lipid Profile: No results for input(s): CHOL, HDL, LDLCALC, TRIG, CHOLHDL, LDLDIRECT in the last 72 hours. Thyroid Function Tests: No results for input(s): TSH, T4TOTAL, FREET4, T3FREE, THYROIDAB in the last 72 hours. Anemia Panel: No results for input(s): VITAMINB12, FOLATE, FERRITIN, TIBC, IRON, RETICCTPCT in the last 72 hours. Sepsis Labs: No results for input(s): PROCALCITON, LATICACIDVEN in the last 168 hours.  Recent Results (from the past 240 hour(s))  Resp Panel by RT-PCR (Flu A&B, Covid) Nasopharyngeal Swab     Status: None   Collection Time: 11/01/21 10:33 PM   Specimen: Nasopharyngeal Swab; Nasopharyngeal(NP) swabs in vial  transport medium  Result Value Ref Range Status   SARS Coronavirus 2 by RT PCR NEGATIVE NEGATIVE Final    Comment: (NOTE) SARS-CoV-2 target nucleic acids are NOT DETECTED.  The SARS-CoV-2 RNA is generally detectable in upper respiratory specimens during the acute phase of infection. The lowest concentration of SARS-CoV-2 viral copies this assay can detect is 138 copies/mL. A negative result does not preclude SARS-Cov-2 infection and should not be used  as the sole basis for treatment or other patient management decisions. A negative result may occur with  improper specimen collection/handling, submission of specimen other than nasopharyngeal swab, presence of viral mutation(s) within the areas targeted by this assay, and inadequate number of viral copies(<138 copies/mL). A negative result must be combined with clinical observations, patient history, and epidemiological information. The expected result is Negative.  Fact Sheet for Patients:  EntrepreneurPulse.com.au  Fact Sheet for Healthcare Providers:  IncredibleEmployment.be  This test is no t yet approved or cleared by the [place] FDA and  has been authorized for detection and/or diagnosis of SARS-CoV-2 by FDA under an Emergency Use Authorization (EUA). This EUA will remain  in effect (meaning this test can be used) for the duration of the COVID-19 declaration under Section 564(b)(1) of the Act, 21 U.S.C.section 360bbb-3(b)(1), unless the authorization is terminated  or revoked sooner.       Influenza A by PCR NEGATIVE NEGATIVE Final   Influenza B by PCR NEGATIVE NEGATIVE Final    Comment: (NOTE) The Xpert Xpress SARS-CoV-2/FLU/RSV plus assay is intended as an aid in the diagnosis of influenza from Nasopharyngeal swab specimens and should not be used as a sole basis for treatment. Nasal washings and aspirates are unacceptable for Xpert Xpress SARS-CoV-2/FLU/RSV testing.  Fact  Sheet for Patients: EntrepreneurPulse.com.au  Fact Sheet for Healthcare Providers: IncredibleEmployment.be  This test is not yet approved or cleared by the [place] FDA and has been authorized for detection and/or diagnosis of SARS-CoV-2 by FDA under an Emergency Use Authorization (EUA). This EUA will remain in effect (meaning this test can be used) for the duration of the COVID-19 declaration under Section 564(b)(1) of the Act, 21 U.S.C. section 360bbb-3(b)(1), unless the authorization is terminated or revoked.  Performed at [facility], [place] [address], [place], [place] [address]          Radiology Studies: [facility] Chest Port 1 View  Result Date: 11/01/2021 CLINICAL DATA:  Increasing shortness of breath for 3 weeks. EXAM: PORTABLE CHEST 1 VIEW COMPARISON:  08/18/2021. FINDINGS: The heart size and mediastinal contours are within normal limits. There is atherosclerotic calcification of the aorta. Surgical changes are present in the mid to upper right lung field. Mild interstitial prominence and a few airspace opacities are noted at the lung bases bilaterally. There is mild blunting of the right costophrenic angle. No pneumothorax. No acute osseous abnormality. IMPRESSION: 1. Interstitial prominence with mild airspace disease at the lung bases, which may be infectious or inflammatory. 2. Blunting of the right costophrenic angle, may represent atelectasis or scarring. A small pleural effusion can not be excluded. Electronically Signed   By: Brett Fairy M.D.   On: 11/01/2021 22:48        Scheduled Meds:  aspirin EC  81 mg Oral Daily   atorvastatin  80 mg Oral Daily   [START ON 11/03/2021] azithromycin  500 mg Oral Daily   budesonide (PULMICORT) nebulizer solution  0.5 mg Nebulization BID   cilostazol  100 mg Oral BID   clopidogrel  75 mg Oral Daily   enoxaparin (LOVENOX) injection  40 mg Subcutaneous Q24H   ferrous sulfate   325 mg Oral Q breakfast   furosemide  40 mg Oral BID   ipratropium-albuterol  3 mL Nebulization Q4H   levothyroxine  112 mcg Oral Q0600   LORazepam  2 mg Oral QHS   methylPREDNISolone (SOLU-MEDROL) injection  40 mg Intravenous Q12H   montelukast  10 mg Oral  QHS   pantoprazole  40 mg Oral Daily   PARoxetine  20 mg Oral QHS   potassium chloride SA  20 mEq Oral TID   tamsulosin  0.4 mg Oral Daily   traZODone  100 mg Oral QHS   Continuous Infusions:  cefTRIAXone (ROCEPHIN)  IV Stopped (11/02/21 0820)     LOS: 0 days    Time spent:0 min    Owenn Rothermel, Geraldo Docker, MD [facility]   If 7PM-7AM, please contact night-coverage 11/02/2021, 6:18 PM

## 2021-11-02 NOTE — H&P (Addendum)
History and Physical    [name] IHK:[id number] DOB: 05-06-1946 DOA: 11/01/2021  PCP: Martinique, [name], MD  Patient coming from: Home.  Chief Complaint: Shortness of breath.  HPI: [name] is a 76 y.o. female with history of COPD on home oxygen, chronic systolic heart failure last EF measured in October 2022 was 38 to 60% with grade 2 diastolic dysfunction, peripheral vascular disease status post subclavian stenting and history of mesenteric ischemia, hypothyroidism hyperlipidemia presents to the ER because of worsening shortness of breath.  Patient states he has been progressively getting short of breath for the last 3 weeks and has been having cough unable to bring her phlegm.  Denies any chest pain.  ED Course: In the ER patient has been found to be diffusely wheezing with chest x-ray showing possible infiltrates.  Patient is afebrile.  EKG shows nonspecific findings.  Patient was given nebulizer steroids and admitted for further work-up.  Review of Systems: As per HPI, rest all negative.   Past Medical History:  Diagnosis Date   Bilateral leg cramps    Bladder neoplasm    Cancer (HCC)    CHF (congestive heart failure) (HCC)    Chronic deep vein thrombosis (DVT) of right lower extremity ([place])    05/ 2018  chronic non-occlusive DVT RLE   COPD, frequent exacerbations ([place])    03-06-2018 per pt last exacerbation 12/ 2018   Coronary artery disease    cardiologist-  dr Aundra Dubin-- 03-11-2018 er cath , 80% stenosis in the ostial second diagonal   Dyspnea on minimal exertion    Emphysema/COPD ([place])    CAT score- 17   Family history of breast cancer    Family history of colon cancer    Family history of melanoma    Family history of ovarian cancer    Family history of pancreatic cancer    Fibromyalgia 1995   GERD (gastroesophageal reflux disease)    Hiatal hernia    History of breast cancer    History of diverticulitis of colon    2002  s/p  sigmoid colectomy   History of  multiple pulmonary nodules    hx RUL nodules x2  s/p right VATS w/ wedge resection 09-11-2005 and 06-14-2011  both  necrotizing granulomatous inflammation w/ cystic area of necrosis & focal calcification   History of right breast cancer oncologist-  dr Jana Hakim-- no recurrence   dx 2010--  IDC, Stage IA , ER/PR+,  (pT1c pN0) 11-09-2008 right lumpectomy;  12-18-2008 right simple mastectomy for DCIS margins;  completed chemotherapy 2010 (no radiation) and completed antiestrogen therapy   Hx of colonic polyps    Dr. 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   Nocturia    OA (osteoarthritis)    thumbs   On supplemental oxygen therapy    03-06-2018 per pt uses only at night,  checks O2 sats at home,  stated am sat 89% after moving around average 93-94% with RA   Osteoporosis    Peripheral arterial occlusive disease ([place]) vascular-- dr chen/ dr Donzetta Matters   proximal right SFA severe focal stenosis with collaterals from the PFA/  04/ 2012 occluded celiac and SMA arteries with distal reconstitution w/ patent IMA   Peripheral vascular disease ([place])    chronic DVT RLE,  mesenteric vascular disease   Right lower lobe pulmonary nodule  Chest CT 08-29-2017   Stage 3 severe COPD by GOLD classification ([place]) hx frequent exacerbations--   pulmologist-  dr Maryann Alar--  per lov note , dated 12-20-2017, oxyogen 2L is prescribed for use with exertion (but pt only uses mostly at night), O2 sats on RA run the 80s , this day sat 86% RA and with 2L O2 sat 92%   Varicose veins of leg with swelling    varicose vein surgery - Dr. Aleda Grana   Wears glasses    Wears hearing aid in both ears     Past Surgical History:  Procedure Laterality Date   ABDOMINAL AORTOGRAM N/A 07/11/2020   Procedure: ABDOMINAL AORTOGRAM;  Surgeon: Waynetta Sandy, MD;  Location: [facility];  Service: Cardiovascular;   Laterality: N/A;   AORTIC ARCH ANGIOGRAPHY N/A 07/11/2020   Procedure: AORTIC ARCH ANGIOGRAPHY;  Surgeon: Waynetta Sandy, MD;  Location: [facility];  Service: Cardiovascular;  Laterality: N/A;   BREAST EXCISIONAL BIOPSY Left 10/15/2008   BREAST LUMPECTOMY W/ NEEDLE LOCALIZATION Right 11-09-2008    dr cornett   [place]  03-12-2011   dr Aundra Dubin   70-80% ostial stenosis in small second diagonal (appears to small for intervention),  dLAD 40-50%,  minimal luminal irregulartieis involoving LCFx and RCA,  LVEF 55%   CATARACT EXTRACTION W/ INTRAOCULAR LENS  IMPLANT, BILATERAL  2011   CYSTOSCOPY N/A 03/11/2018   Procedure: CYSTOSCOPY WITH INSTILLATION OF POST OPERATIVE EPIRUBICIN;  Surgeon: Festus Aloe, MD;  Location: [facility];  Service: Urology;  Laterality: N/A;   LEFT HEART CATH AND CORONARY ANGIOGRAPHY N/A 05/25/2020   Procedure: LEFT HEART CATH AND CORONARY ANGIOGRAPHY;  Surgeon: Troy Sine, MD;  Location: [facility];  Service: Cardiovascular;  Laterality: N/A;   MASTECTOMY Right    PARTIAL COLECTOMY  2002   sigmoid and appectomy (diverticulitis)   PARTIAL MASTECTOMY WITH NEEDLE LOCALIZATION Left 02/23/2013   Procedure:  LEFT PARTIAL MASTECTOMY WITH NEEDLE LOCALIZATION;  Surgeon: Adin Hector, MD;  Location: [facility];  Service: General;  Laterality: Left;   PERIPHERAL VASCULAR INTERVENTION  07/11/2020   Procedure: PERIPHERAL VASCULAR INTERVENTION;  Surgeon: Waynetta Sandy, MD;  Location: [facility];  Service: Cardiovascular;;   RECONSTRUCTION BREAST W/ LATISSIMUS DORSI FLAP Right 05-30-2009   dr Harlow Mares  [facility]   REDUCTION MAMMAPLASTY Left    SIMPLE MASTECTOMY Right 12-28-2008    dr cornett  [facility]   TRANSTHORACIC ECHOCARDIOGRAM  07-09-2016  dr Aundra Dubin   ef 55-60%, grade 1 diastoic dysfunction/  mild TR   TRANSURETHRAL RESECTION OF BLADDER TUMOR N/A 03/11/2018   Procedure: TRANSURETHRAL RESECTION OF BLADDER  TUMOR (TURBT) 2-5cm;  Surgeon: Festus Aloe, MD;  Location: [facility];  Service: Urology;  Laterality: N/A;   UPPER EXTREMITY ANGIOGRAPHY Left 07/11/2020   Procedure: Upper Extremity Angiography;  Surgeon: Waynetta Sandy, MD;  Location: [facility];  Service: Cardiovascular;  Laterality: Left;   VAGINAL HYSTERECTOMY  1973   VIDEO ASSISTED THORACOSCOPY (VATS)/WEDGE RESECTION Right 09-11-2005  &  06-14-2011   dr Fara Boros  [facility]   both RUL     reports that she quit smoking about 5 years ago. Her smoking use included cigarettes. She has a 37.50 pack-year smoking history. She has never used smokeless tobacco. She reports current alcohol use of about 10.0 standard drinks per week. She reports that she does not use drugs.  Allergies  Allergen Reactions   Sinequan [Doxepin] Other (See  Comments)    Kept her awake    Sulfa Antibiotics Swelling   Sulfur     Other reaction(s): Unknown    Family History  Problem Relation Age of Onset   Colon cancer Mother        colon   COPD Mother        brown lung   Hypertension Mother    Diabetes Mother    Emphysema Mother    Coronary artery disease Father    Heart attack Father    Sudden death Father    Heart disease Father    Cancer Sister        throat   Hypertension Sister    Coronary artery disease Brother    Heart attack Brother        early 28s   Throat cancer Sister    Ovarian cancer Sister        fallopian tube cancer in her 39s   Melanoma Sister    Hypertension Sister    Pancreatic cancer Sister    Melanoma Niece        dx in her 54s   Breast cancer Niece 83    Prior to Admission medications   Medication Sig Start Date End Date Taking? Authorizing Provider  albuterol (VENTOLIN HFA) 108 (90 Base) MCG/ACT inhaler TAKE 2 PUFFS BY MOUTH EVERY 6 HOURS AS NEEDED FOR WHEEZE OR SHORTNESS OF BREATH Patient taking differently: Inhale 1 puff into the lungs every 4 (four) hours as needed for wheezing or shortness of breath.  09/05/20   Martyn Ehrich, NP  aspirin EC 81 MG tablet Take 1 tablet (81 mg total) by mouth daily. 08/21/20   Cherene Altes, MD  atorvastatin (LIPITOR) 80 MG tablet Take 1 tablet (80 mg total) by mouth daily. 06/15/21   Freada Bergeron, MD  budesonide (PULMICORT) 0.5 MG/2ML nebulizer solution Take 2 mLs (0.5 mg total) by nebulization 2 (two) times daily. 09/15/20   Elie Confer, MD  cilostazol (PLETAL) 100 MG tablet TAKE 1 TABLET BY MOUTH TWICE A DAY Patient taking differently: Take 100 mg by mouth 2 (two) times daily. 11/28/20   [name], NP  clopidogrel (PLAVIX) 75 MG tablet TAKE 1 TABLET BY MOUTH EVERY DAY Patient taking differently: Take 75 mg by mouth daily. 07/08/21   Waynetta Sandy, MD  DUPIXENT 300 MG/2ML SOPN Inject 2 mLs into the skin every 14 (fourteen) days. 01/03/21   [provider]  esomeprazole (NEXIUM) 40 MG capsule Take 1 capsule (40 mg total) by mouth daily. 05/19/21   Martinique, [name], MD  ferrous sulfate 325 (65 FE) MG tablet TAKE 1 TABLET BY MOUTH EVERY DAY WITH BREAKFAST 09/06/21   Martinique, [name], MD  fluticasone [facility]) 50 MCG/ACT nasal spray SPRAY 1 SPRAY INTO EACH NOSTRIL TWICE A DAY 10/02/21   Martinique, [name], MD  Fluticasone-Umeclidin-Vilant (TRELEGY ELLIPTA) 200-62.5-25 MCG/INH AEPB Inhale 1 puff into the lungs daily. 07/21/21   Deneise Lever, MD  furosemide (LASIX) 40 MG tablet TAKE 1 TABLET BY MOUTH TWICE A DAY Patient taking differently: Take 40 mg by mouth 2 (two) times daily. 06/26/21   Martinique, [name], MD  guaiFENesin (MUCINEX) 600 MG 12 hr tablet Take 600 mg by mouth daily.    [provider]  hydrOXYzine (ATARAX/VISTARIL) 25 MG tablet Take 25 mg by mouth daily. 03/31/21   [provider]  ipratropium-albuterol (DUONEB) 0.5-2.5 (3) MG/3ML SOLN INHALE 3 MLS INTO THE LUNGS EVERY 6 (  SIX) HOURS AS NEEDED (SOB, WHEEZING). 05/09/21   Martyn Ehrich, NP  KLOR-CON M20 20 MEQ tablet TAKE 1 TABLET BY MOUTH THREE  TIMES A DAY 08/23/21   Martinique, [name], MD  levothyroxine (SYNTHROID) 112 MCG tablet TAKE 1 TABLET BY MOUTH EVERY DAY IN THE MORNING BEFORE BREAKFAST 09/15/21   Martinique, [name], MD  LORazepam (ATIVAN) 2 MG tablet TAKE 1 TABLET BY MOUTH AT BEDTIME. 09/27/21   Martinique, [name], MD  montelukast (SINGULAIR) 10 MG tablet TAKE 1 TABLET BY MOUTH EVERYDAY AT BEDTIME Patient taking differently: Take 10 mg by mouth at bedtime. 04/21/21   Martyn Ehrich, NP  PARoxetine (PAXIL) 20 MG tablet TAKE 1 TABLET BY MOUTH EVERYDAY AT BEDTIME Patient taking differently: Take 20 mg by mouth daily. 02/27/21   Martinique, [name], MD  predniSONE (DELTASONE) 10 MG tablet Take q dqy 6,5,4,3,2,1 09/05/21   Daleen Bo, MD  tamsulosin (FLOMAX) 0.4 MG CAPS capsule TAKE 1 CAPSULE BY MOUTH EVERY DAY 09/25/21   Martinique, [name], MD  traZODone (DESYREL) 100 MG tablet TAKE 1 TABLET BY MOUTH EVERYDAY AT BEDTIME Patient taking differently: Take 100 mg by mouth at bedtime. 06/20/21   Martinique, [name], MD    Physical Exam: Constitutional: Moderately built and nourished. Vitals:   11/01/21 2229 11/01/21 2245 11/01/21 2300  BP: 121/67 122/64 (!) 108/52  Pulse: 91 92 91  Resp: 20 18 20   Temp: 97.9 F (36.6 C)  98.3 F (36.8 C)  SpO2: 97% 94% 93%  Weight: 61.2 kg    Height: 5\' 6"  (1.676 m)     Eyes: Anicteric no pallor. ENMT: No discharge from the ears eyes nose and mouth. Neck: No mass felt.  No JVD appreciated. Respiratory: Bilateral expiratory wheeze and no crepitations. Cardiovascular: S1-S2 heard. Abdomen: Soft nontender bowel sound present. Musculoskeletal: No edema. Skin: No rash. Neurologic: Alert awake oriented time place and person.  Moves all extremities. Psychiatric: Appears normal.  Normal affect.   Labs on Admission: I have personally reviewed following labs and imaging studies  CBC: Recent Labs  Lab 11/01/21 2259  WBC 6.7  NEUTROABS 5.4  HGB 12.5  HCT 38.5  MCV 93.0  PLT 009   Basic Metabolic  Panel: Recent Labs  Lab 11/01/21 2259  NA 135  K 3.2*  CL 97*  CO2 25  GLUCOSE 128*  BUN 15  CREATININE 0.90  CALCIUM 8.5*   GFR: Estimated Creatinine Clearance: 49.8 mL/min (by C-G formula based on SCr of 0.9 mg/dL). Liver Function Tests: No results for input(s): AST, ALT, ALKPHOS, BILITOT, PROT, ALBUMIN in the last 168 hours. No results for input(s): LIPASE, AMYLASE in the last 168 hours. No results for input(s): AMMONIA in the last 168 hours. Coagulation Profile: No results for input(s): INR, PROTIME in the last 168 hours. Cardiac Enzymes: No results for input(s): CKTOTAL, CKMB, CKMBINDEX, TROPONINI in the last 168 hours. BNP (last 3 results) No results for input(s): PROBNP in the last 8760 hours. HbA1C: No results for input(s): HGBA1C in the last 72 hours. CBG: No results for input(s): GLUCAP in the last 168 hours. Lipid Profile: No results for input(s): CHOL, HDL, LDLCALC, TRIG, CHOLHDL, LDLDIRECT in the last 72 hours. Thyroid Function Tests: No results for input(s): TSH, T4TOTAL, FREET4, T3FREE, THYROIDAB in the last 72 hours. Anemia Panel: No results for input(s): VITAMINB12, FOLATE, FERRITIN, TIBC, IRON, RETICCTPCT in the last 72 hours. Urine analysis:    Component Value Date/Time   COLORURINE YELLOW 07/31/2021 1244  APPEARANCEUR CLEAR 07/31/2021 1244   LABSPEC 1.011 07/31/2021 1244   LABSPEC 1.030 03/08/2009 1016   PHURINE 5.0 07/31/2021 [place] 07/31/2021 [place] 09/07/2009 0752   HGBUR NEGATIVE 07/31/2021 1244   HGBUR negative 11/09/2010 [place] 07/31/2021 1244   BILIRUBINUR Negative 03/08/2009 [place] 07/31/2021 1244   PROTEINUR NEGATIVE 07/31/2021 1244   UROBILINOGEN 0.2 03/23/2013 0248   NITRITE NEGATIVE 07/31/2021 1244   LEUKOCYTESUR TRACE (A) 07/31/2021 1244   LEUKOCYTESUR Negative 03/08/2009 1016   Sepsis Labs: @LABRCNTIP (procalcitonin:4,lacticidven:4) ) Recent Results  (from the past 240 hour(s))  Resp Panel by RT-PCR (Flu A&B, Covid) Nasopharyngeal Swab     Status: None   Collection Time: 11/01/21 10:33 PM   Specimen: Nasopharyngeal Swab; Nasopharyngeal(NP) swabs in vial transport medium  Result Value Ref Range Status   SARS Coronavirus 2 by RT PCR NEGATIVE NEGATIVE Final    Comment: (NOTE) SARS-CoV-2 target nucleic acids are NOT DETECTED.  The SARS-CoV-2 RNA is generally detectable in upper respiratory specimens during the acute phase of infection. The lowest concentration of SARS-CoV-2 viral copies this assay can detect is 138 copies/mL. A negative result does not preclude SARS-Cov-2 infection and should not be used as the sole basis for treatment or other patient management decisions. A negative result may occur with  improper specimen collection/handling, submission of specimen other than nasopharyngeal swab, presence of viral mutation(s) within the areas targeted by this assay, and inadequate number of viral copies(<138 copies/mL). A negative result must be combined with clinical observations, patient history, and epidemiological information. The expected result is Negative.  Fact Sheet for Patients:  EntrepreneurPulse.com.au  Fact Sheet for Healthcare Providers:  IncredibleEmployment.be  This test is no t yet approved or cleared by the [place] FDA and  has been authorized for detection and/or diagnosis of SARS-CoV-2 by FDA under an Emergency Use Authorization (EUA). This EUA will remain  in effect (meaning this test can be used) for the duration of the COVID-19 declaration under Section 564(b)(1) of the Act, 21 U.S.C.section 360bbb-3(b)(1), unless the authorization is terminated  or revoked sooner.       Influenza A by PCR NEGATIVE NEGATIVE Final   Influenza B by PCR NEGATIVE NEGATIVE Final    Comment: (NOTE) The Xpert Xpress SARS-CoV-2/FLU/RSV plus assay is intended as an aid in the  diagnosis of influenza from Nasopharyngeal swab specimens and should not be used as a sole basis for treatment. Nasal washings and aspirates are unacceptable for Xpert Xpress SARS-CoV-2/FLU/RSV testing.  Fact Sheet for Patients: EntrepreneurPulse.com.au  Fact Sheet for Healthcare Providers: IncredibleEmployment.be  This test is not yet approved or cleared by the [place] FDA and has been authorized for detection and/or diagnosis of SARS-CoV-2 by FDA under an Emergency Use Authorization (EUA). This EUA will remain in effect (meaning this test can be used) for the duration of the COVID-19 declaration under Section 564(b)(1) of the Act, 21 U.S.C. section 360bbb-3(b)(1), unless the authorization is terminated or revoked.  Performed at [facility], [place] [address], [place], [place] [address]      Radiological Exams on Admission: DG Chest Port 1 View  Result Date: 11/01/2021 CLINICAL DATA:  Increasing shortness of breath for 3 weeks. EXAM: PORTABLE CHEST 1 VIEW COMPARISON:  08/18/2021. FINDINGS: The heart size and mediastinal contours are within normal limits. There is atherosclerotic calcification of the aorta. Surgical changes are present in the mid to upper right lung field. Mild  interstitial prominence and a few airspace opacities are noted at the lung bases bilaterally. There is mild blunting of the right costophrenic angle. No pneumothorax. No acute osseous abnormality. IMPRESSION: 1. Interstitial prominence with mild airspace disease at the lung bases, which may be infectious or inflammatory. 2. Blunting of the right costophrenic angle, may represent atelectasis or scarring. A small pleural effusion can not be excluded. Electronically Signed   By: Brett Fairy M.D.   On: 11/01/2021 22:48    EKG: Independently reviewed.  Normal sinus rhythm.  Assessment/Plan Principal Problem:   COPD exacerbation (HCC) Active Problems:    Hypothyroidism   Essential hypertension   Systolic CHF, chronic (HCC)    Acute COPD exacerbation on exam patient is diffusely wheezing chest x-ray showing possible infiltrates.  We will keep patient on IV Solu-Medrol Pulmicort nebulizer and also add antibiotics for possible pneumonia.  Sputum cultures. History of chronic systolic heart failure last EF in October 2022 was 55 to 60% with grade 2 diastolic dysfunction.  We will continue home dose of Lasix.  Troponins and BNP are pending.  I think patient's shortness of breath is likely from COPD. History of peripheral vascular disease status post subclavian stenting and history of mesenteric ischemia presently on aspirin Plavix Pletal and statins. Hypothyroidism on Synthroid Hyperlipidemia on statins. History of sleep apnea on 2 L oxygen at bedtime and also requires using CPAP.   DVT prophylaxis: Lovenox. Code Status: DNR. Family Communication: Discussed with patient. Disposition Plan: Home. Consults called: None. Admission status: Observation.   Rise Patience MD [facility] Pager [phone].  If 7PM-7AM, please contact night-coverage [email or web address] Password [email or web address]  11/02/2021, 1:48 AM

## 2021-11-02 NOTE — ED Notes (Signed)
Pt received lunch tray 

## 2021-11-02 NOTE — ED Notes (Signed)
ED TO INPATIENT HANDOFF REPORT  Name/Age/Gender [name] 76 y.o. female  Code Status    Code Status Orders  (From admission, onward)           Start     Ordered   11/02/21 0147  Do not attempt resuscitation (DNR)  Continuous       Question Answer Comment  In the event of cardiac or respiratory ARREST Do not call a code blue   In the event of cardiac or respiratory ARREST Do not perform Intubation, CPR, defibrillation or ACLS   In the event of cardiac or respiratory ARREST Use medication by any route, position, wound care, and other measures to relive pain and suffering. May use oxygen, suction and manual treatment of airway obstruction as needed for comfort.      11/02/21 0148           Code Status History     Date Active Date Inactive Code Status Order ID Comments User Context   07/31/2021 1632 08/08/2021 2229 DNR [id number]  Orma Flaming, MD ED   09/10/2020 1257 09/15/2020 2222 DNR [id number]  Harold Hedge, MD Inpatient   08/14/2020 1351 08/21/2020 2232 DNR [id number]  Harold Hedge, MD ED   07/11/2020 0906 07/11/2020 1826 Full Code [id number]  Waynetta Sandy, MD Inpatient   05/21/2020 0727 05/29/2020 1619 Full Code [id number]  Toy Baker, MD ED   11/16/2016 1318 11/21/2016 1534 Full Code [id number]  Chesley Mires, MD Inpatient   07/02/2016 1715 07/10/2016 1949 Full Code [id number]  Rondel Jumbo, PA-C Inpatient   07/02/2016 1605 07/02/2016 1715 Full Code [id number]  Rondel Jumbo, PA-C ED   05/04/2016 2010 05/07/2016 1700 Full Code [id number]  Lily Kocher, MD Inpatient   03/23/2013 0704 03/30/2013 1044 DNR [id number]  Orvan Falconer, MD Inpatient       Home/SNF/Other Home  Chief Complaint COPD exacerbation [facility]) [J44.1]  Level of Care/Admitting Diagnosis ED Disposition     ED Disposition  Admit   Condition  --   [place] [facility]: [facility] [[id number]]  Level of Care: Telemetry [5]  Admit to tele based on following criteria:  Monitor for Ischemic changes  May place patient in observation at [facility] or [place] if equivalent level of care is available:: No  Covid Evaluation: Asymptomatic Screening Protocol (No Symptoms)  Diagnosis: COPD exacerbation [facility]) [[id number]]  Admitting Physician: Rise Patience [phone]  Attending Physician: Rise Patience [phone]          Medical History Past Medical History:  Diagnosis Date   Bilateral leg cramps    Bladder neoplasm    Cancer ([place])    CHF (congestive heart failure) ([place])    Chronic deep vein thrombosis (DVT) of right lower extremity ([place])    05/ 2018  chronic non-occlusive DVT RLE   COPD, frequent exacerbations ([place])    03-06-2018 per pt last exacerbation 12/ 2018   Coronary artery disease    cardiologist-  dr Aundra Dubin-- 03-11-2018 er cath , 80% stenosis in the ostial second diagonal   Dyspnea on minimal exertion    Emphysema/COPD ([place])    CAT score- 17   Family history of breast cancer    Family history of colon cancer    Family history of melanoma    Family history of ovarian cancer    Family history of pancreatic cancer    Fibromyalgia 1995   GERD (gastroesophageal reflux disease)    Hiatal hernia  History of breast cancer    History of diverticulitis of colon    2002  s/p  sigmoid colectomy   History of multiple pulmonary nodules    hx RUL nodules x2  s/p right VATS w/ wedge resection 09-11-2005 and 06-14-2011  both  necrotizing granulomatous inflammation w/ cystic area of necrosis & focal calcification   History of right breast cancer oncologist-  dr Jana Hakim-- no recurrence   dx 2010--  IDC, Stage IA , ER/PR+,  (pT1c pN0) 11-09-2008 right lumpectomy;  12-18-2008 right simple mastectomy for DCIS margins;  completed chemotherapy 2010 (no radiation) and completed antiestrogen therapy   Hx of colonic polyps    Dr. 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-- dr chen/ dr Donzetta Matters   proximal right SFA severe focal stenosis with collaterals from the PFA/  04/ 2012 occluded celiac and SMA arteries with distal reconstitution w/ patent IMA   Peripheral vascular disease ([place])    chronic DVT RLE,  mesenteric vascular disease   Right lower lobe pulmonary nodule    Chest CT 08-29-2017   Stage 3 severe COPD by GOLD classification ([place]) hx frequent exacerbations--   pulmologist-  dr Maryann Alar--  per lov note , dated 12-20-2017, oxyogen 2L is prescribed for use with exertion (but pt only uses mostly at night), O2 sats on RA run the 80s , this day sat 86% RA and with 2L O2 sat 92%   Varicose veins of leg with swelling    varicose vein surgery - Dr. Aleda Grana   Wears glasses    Wears hearing aid in both ears     Allergies Allergies  Allergen Reactions   Sinequan [Doxepin] Other (See Comments)    Kept her awake    Sulfa Antibiotics Swelling   Sulfur     Other reaction(s): Unknown    IV Location/Drains/Wounds Patient Lines/Drains/Airways Status     Active Line/Drains/Airways     Name Placement date Placement time Site Days   Peripheral IV 11/01/21 20 G Left;Posterior Hand 11/01/21  --  Hand  1            Labs/Imaging Results for orders placed or performed during the hospital encounter of 11/01/21 (from the past 48 hour(s))  Resp Panel by RT-PCR (Flu A&B, Covid) Nasopharyngeal Swab     Status: None   Collection Time: 11/01/21 10:33 PM   Specimen: Nasopharyngeal Swab; Nasopharyngeal(NP) swabs in vial transport medium  Result Value Ref Range   SARS Coronavirus 2 by RT PCR NEGATIVE NEGATIVE    Comment: (NOTE) SARS-CoV-2 target nucleic  acids are NOT DETECTED.  The SARS-CoV-2 RNA is generally detectable in upper respiratory specimens during the acute phase of infection. The lowest concentration of SARS-CoV-2 viral copies this assay can detect is 138 copies/mL. A negative result does not preclude SARS-Cov-2 infection and should not be used as the sole basis for treatment or other patient management decisions. A negative result may occur with  improper specimen collection/handling, submission of specimen other than nasopharyngeal swab, presence of viral  mutation(s) within the areas targeted by this assay, and inadequate number of viral copies(<138 copies/mL). A negative result must be combined with clinical observations, patient history, and epidemiological information. The expected result is Negative.  Fact Sheet for Patients:  EntrepreneurPulse.com.au  Fact Sheet for Healthcare Providers:  IncredibleEmployment.be  This test is no t yet approved or cleared by the [place] FDA and  has been authorized for detection and/or diagnosis of SARS-CoV-2 by FDA under an Emergency Use Authorization (EUA). This EUA will remain  in effect (meaning this test can be used) for the duration of the COVID-19 declaration under Section 564(b)(1) of the Act, 21 U.S.C.section 360bbb-3(b)(1), unless the authorization is terminated  or revoked sooner.       Influenza A by PCR NEGATIVE NEGATIVE   Influenza B by PCR NEGATIVE NEGATIVE    Comment: (NOTE) The Xpert Xpress SARS-CoV-2/FLU/RSV plus assay is intended as an aid in the diagnosis of influenza from Nasopharyngeal swab specimens and should not be used as a sole basis for treatment. Nasal washings and aspirates are unacceptable for Xpert Xpress SARS-CoV-2/FLU/RSV testing.  Fact Sheet for Patients: EntrepreneurPulse.com.au  Fact Sheet for Healthcare Providers: IncredibleEmployment.be  This test is not  yet approved or cleared by the [place] FDA and has been authorized for detection and/or diagnosis of SARS-CoV-2 by FDA under an Emergency Use Authorization (EUA). This EUA will remain in effect (meaning this test can be used) for the duration of the COVID-19 declaration under Section 564(b)(1) of the Act, 21 U.S.C. section 360bbb-3(b)(1), unless the authorization is terminated or revoked.  Performed at [facility], [place] [address] [place], [place] [address]   Basic metabolic panel     Status: Abnormal   Collection Time: 11/01/21 10:59 PM  Result Value Ref Range   Sodium 135 135 - 145 mmol/L   Potassium 3.2 (L) 3.5 - 5.1 mmol/L   Chloride 97 (L) 98 - 111 mmol/L   CO2 25 22 - 32 mmol/L   Glucose, Bld 128 (H) 70 - 99 mg/dL    Comment: Glucose reference range applies only to samples taken after fasting for at least 8 hours.   BUN 15 8 - 23 mg/dL   Creatinine, Ser 0.90 0.44 - 1.00 mg/dL   Calcium 8.5 (L) 8.9 - 10.3 mg/dL   GFR, Estimated >60 >60 mL/min    Comment: (NOTE) Calculated using the CKD-EPI Creatinine Equation (2021)    Anion gap 13 5 - 15    Comment: Performed at [facility], [place] [address] [place], [place] [address]  CBC with Differential     Status: Abnormal   Collection Time: 11/01/21 10:59 PM  Result Value Ref Range   WBC 6.7 4.0 - 10.5 K/uL   RBC 4.14 3.87 - 5.11 MIL/uL   Hemoglobin 12.5 12.0 - 15.0 g/dL   HCT 38.5 36.0 - 46.0 %   MCV 93.0 80.0 - 100.0 fL   MCH 30.2 26.0 - 34.0 pg   MCHC 32.5 30.0 - 36.0 g/dL   RDW 16.8 (H) 11.5 - 15.5 %   Platelets 359 150 - 400 K/uL   nRBC 0.0 0.0 - 0.2 %   Neutrophils Relative % 80 %   Neutro Abs 5.4 1.7 - 7.7 K/uL   Lymphocytes Relative 13 %   Lymphs Abs 0.8 0.7 - 4.0 K/uL   Monocytes Relative 3 %   Monocytes Absolute 0.2 0.1 - 1.0 K/uL   Eosinophils Relative 2 %   Eosinophils Absolute  0.1 0.0 - 0.5 K/uL   Basophils Relative 1 %   Basophils Absolute 0.1 0.0 - 0.1 K/uL    Immature Granulocytes 1 %   Abs Immature Granulocytes 0.09 (H) 0.00 - 0.07 K/uL    Comment: Performed at [facility], [place] [address], [place], [place] [address]  Brain natriuretic peptide     Status: Abnormal   Collection Time: 11/01/21 10:59 PM  Result Value Ref Range   B Natriuretic Peptide 123.9 (H) 0.0 - 100.0 pg/mL    Comment: Performed at [facility], [place] [address] [place], [place] [address]  Creatinine, serum     Status: None   Collection Time: 11/01/21 11:03 PM  Result Value Ref Range   Creatinine, Ser 0.96 0.44 - 1.00 mg/dL   GFR, Estimated >60 >60 mL/min    Comment: (NOTE) Calculated using the CKD-EPI Creatinine Equation (2021) Performed at [facility], [place] [address] [place],  [address]   Basic metabolic panel     Status: Abnormal   Collection Time: 11/02/21  4:34 AM  Result Value Ref Range   Sodium 134 (L) 135 - 145 mmol/L   Potassium 4.0 3.5 - 5.1 mmol/L    Comment: DELTA CHECK NOTED   Chloride 99 98 - 111 mmol/L   CO2 24 22 - 32 mmol/L   Glucose, Bld 183 (H) 70 - 99 mg/dL    Comment: Glucose reference range applies only to samples taken after fasting for at least 8 hours.   BUN 18 8 - 23 mg/dL   Creatinine, Ser 0.86 0.44 - 1.00 mg/dL   Calcium 8.7 (L) 8.9 - 10.3 mg/dL   GFR, Estimated >60 >60 mL/min    Comment: (NOTE) Calculated using the CKD-EPI Creatinine Equation (2021)    Anion gap 11 5 - 15    Comment: Performed at [facility], [place] [address], [place],  [address]  CBC     Status: Abnormal   Collection Time: 11/02/21  4:34 AM  Result Value Ref Range   WBC 4.2 4.0 - 10.5 K/uL   RBC 4.17 3.87 - 5.11 MIL/uL   Hemoglobin 12.6 12.0 - 15.0 g/dL   HCT 38.3 36.0 - 46.0 %   MCV 91.8 80.0 - 100.0 fL   MCH 30.2 26.0 - 34.0 pg   MCHC 32.9 30.0 - 36.0 g/dL   RDW 16.9 (H) 11.5 - 15.5 %   Platelets 378 150 - 400 K/uL   nRBC 0.0 0.0 - 0.2 %    Comment: Performed at  [facility] [place] [address], [place], [place] [address]  Troponin I (High Sensitivity)     Status: None   Collection Time: 11/02/21  4:34 AM  Result Value Ref Range   Troponin I (High Sensitivity) 7 <18 ng/L    Comment: (NOTE) Elevated high sensitivity troponin I (hsTnI) values and significant  changes across serial measurements may suggest ACS but many other  chronic and acute conditions are known to elevate hsTnI results.  Refer to the "Links" section for chest pain algorithms and additional  guidance. Performed at [facility], [place] [address], [place], [place] [address]   Troponin I (High Sensitivity)     Status: None   Collection Time: 11/02/21  8:40 AM  Result Value Ref Range   Troponin I (High Sensitivity) 7 <18 ng/L    Comment: (NOTE) Elevated high sensitivity troponin I (hsTnI) values and significant  changes across serial measurements  may suggest ACS but many other  chronic and acute conditions are known to elevate hsTnI results.  Refer to the "Links" section for chest pain algorithms and additional  guidance. Performed at [facility], [place] [address], [place], [place] [address]   Troponin I (High Sensitivity)     Status: None   Collection Time: 11/02/21 11:57 AM  Result Value Ref Range   Troponin I (High Sensitivity) 5 <18 ng/L    Comment: (NOTE) Elevated high sensitivity troponin I (hsTnI) values and significant  changes across serial measurements may suggest ACS but many other  chronic and acute conditions are known to elevate hsTnI results.  Refer to the "Links" section for chest pain algorithms and additional  guidance. Performed at [facility], [place] [address], [place], [place] [address]    DG Chest Port 1 View  Result Date: 11/01/2021 CLINICAL DATA:  Increasing shortness of breath for 3 weeks. EXAM: PORTABLE CHEST 1 VIEW COMPARISON:  08/18/2021. FINDINGS: The heart size and  mediastinal contours are within normal limits. There is atherosclerotic calcification of the aorta. Surgical changes are present in the mid to upper right lung field. Mild interstitial prominence and a few airspace opacities are noted at the lung bases bilaterally. There is mild blunting of the right costophrenic angle. No pneumothorax. No acute osseous abnormality. IMPRESSION: 1. Interstitial prominence with mild airspace disease at the lung bases, which may be infectious or inflammatory. 2. Blunting of the right costophrenic angle, may represent atelectasis or scarring. A small pleural effusion can not be excluded. Electronically Signed   By: Brett Fairy M.D.   On: 11/01/2021 22:48    Pending Labs Unresulted Labs (From admission, onward)     Start     Ordered   11/09/21 0500  Creatinine, serum  (enoxaparin (LOVENOX)    CrCl >/= 30 ml/min)  Weekly,   R     Comments: while on enoxaparin therapy    11/02/21 0148   11/03/21 0500  Comprehensive metabolic panel  Daily,   R      11/02/21 1205   11/03/21 0500  Magnesium  Daily,   R      11/02/21 1205   11/03/21 0500  Phosphorus  Daily,   R      11/02/21 1205   11/03/21 0500  CBC with Differential/Platelet  Daily,   R      11/02/21 1205            Vitals/Pain Today's Vitals   11/02/21 1306 11/02/21 1330 11/02/21 1402 11/02/21 1423  BP: 98/66 115/62  130/71  Pulse: 99 83 88 80  Resp: 17 16 17 18   Temp:      SpO2: 95% 96% 98% 96%  Weight:      Height:      PainSc:        Isolation Precautions No active isolations  Medications Medications  aspirin EC tablet 81 mg (81 mg Oral Given 11/02/21 1029)  atorvastatin (LIPITOR) tablet 80 mg (80 mg Oral Given 11/02/21 1029)  furosemide (LASIX) tablet 40 mg (40 mg Oral Given 11/02/21 0833)  LORazepam (ATIVAN) tablet 2 mg (has no administration in time range)  PARoxetine (PAXIL) tablet 20 mg (has no administration in time range)  traZODone (DESYREL) tablet 100 mg (100 mg Oral Not Given  11/02/21 0325)  levothyroxine (SYNTHROID) tablet 112 mcg (112 mcg Oral Given 11/02/21 0541)  pantoprazole (PROTONIX) EC tablet 40 mg (40 mg Oral Given 11/02/21 1030)  tamsulosin (FLOMAX)  capsule 0.4 mg (0.4 mg Oral Given 11/02/21 1029)  cilostazol (PLETAL) tablet 100 mg (100 mg Oral Given 11/02/21 1030)  clopidogrel (PLAVIX) tablet 75 mg (75 mg Oral Given 11/02/21 1030)  ferrous sulfate tablet 325 mg (325 mg Oral Given 11/02/21 0858)  potassium chloride SA (KLOR-CON M) CR tablet 20 mEq (20 mEq Oral Given 11/02/21 1029)  montelukast (SINGULAIR) tablet 10 mg (has no administration in time range)  enoxaparin (LOVENOX) injection 40 mg (40 mg Subcutaneous Given 11/02/21 1032)  azithromycin (ZITHROMAX) 500 mg in sodium chloride 0.9 % 250 mL IVPB (0 mg Intravenous Stopped 11/02/21 0440)    Followed by  azithromycin (ZITHROMAX) tablet 500 mg (has no administration in time range)  acetaminophen (TYLENOL) tablet 650 mg (650 mg Oral Given 11/02/21 0256)    Or  acetaminophen (TYLENOL) suppository 650 mg ( Rectal See Alternative 11/02/21 0256)  albuterol (PROVENTIL) (2.5 MG/3ML) 0.083% nebulizer solution 2.5 mg (has no administration in time range)  methylPREDNISolone sodium succinate (SOLU-MEDROL) 40 mg/mL injection 40 mg (40 mg Intravenous Given 11/02/21 1418)  ipratropium-albuterol (DUONEB) 0.5-2.5 (3) MG/3ML nebulizer solution 3 mL (3 mLs Nebulization Given 11/02/21 1152)  cefTRIAXone (ROCEPHIN) 2 g in sodium chloride 0.9 % 100 mL IVPB (0 g Intravenous Stopped 11/02/21 0820)  budesonide (PULMICORT) nebulizer solution 0.5 mg (0.5 mg Nebulization Given 11/02/21 0833)  potassium chloride SA (KLOR-CON M) CR tablet 20 mEq (20 mEq Oral Given 11/02/21 0541)    Mobility walks with person assist

## 2021-11-03 ENCOUNTER — Inpatient Hospital Stay (HOSPITAL_COMMUNITY): Payer: Medicare Other

## 2021-11-03 DIAGNOSIS — G4733 Obstructive sleep apnea (adult) (pediatric): Secondary | ICD-10-CM

## 2021-11-03 DIAGNOSIS — M549 Dorsalgia, unspecified: Secondary | ICD-10-CM

## 2021-11-03 DIAGNOSIS — G8929 Other chronic pain: Secondary | ICD-10-CM

## 2021-11-03 DIAGNOSIS — Z9989 Dependence on other enabling machines and devices: Secondary | ICD-10-CM

## 2021-11-03 DIAGNOSIS — I10 Essential (primary) hypertension: Secondary | ICD-10-CM

## 2021-11-03 DIAGNOSIS — K2100 Gastro-esophageal reflux disease with esophagitis, without bleeding: Secondary | ICD-10-CM

## 2021-11-03 DIAGNOSIS — R0602 Shortness of breath: Secondary | ICD-10-CM

## 2021-11-03 DIAGNOSIS — K529 Noninfective gastroenteritis and colitis, unspecified: Secondary | ICD-10-CM

## 2021-11-03 DIAGNOSIS — I739 Peripheral vascular disease, unspecified: Secondary | ICD-10-CM

## 2021-11-03 DIAGNOSIS — E038 Other specified hypothyroidism: Secondary | ICD-10-CM

## 2021-11-03 DIAGNOSIS — I5022 Chronic systolic (congestive) heart failure: Secondary | ICD-10-CM

## 2021-11-03 DIAGNOSIS — I5042 Chronic combined systolic (congestive) and diastolic (congestive) heart failure: Secondary | ICD-10-CM

## 2021-11-03 LAB — CBC WITH DIFFERENTIAL/PLATELET
Abs Immature Granulocytes: 0.04 10*3/uL (ref 0.00–0.07)
Basophils Absolute: 0 10*3/uL (ref 0.0–0.1)
Basophils Relative: 0 %
Eosinophils Absolute: 0 10*3/uL (ref 0.0–0.5)
Eosinophils Relative: 0 %
HCT: 38.2 % (ref 36.0–46.0)
Hemoglobin: 12.4 g/dL (ref 12.0–15.0)
Immature Granulocytes: 1 %
Lymphocytes Relative: 10 %
Lymphs Abs: 0.8 10*3/uL (ref 0.7–4.0)
MCH: 29.7 pg (ref 26.0–34.0)
MCHC: 32.5 g/dL (ref 30.0–36.0)
MCV: 91.6 fL (ref 80.0–100.0)
Monocytes Absolute: 0.9 10*3/uL (ref 0.1–1.0)
Monocytes Relative: 11 %
Neutro Abs: 6.1 10*3/uL (ref 1.7–7.7)
Neutrophils Relative %: 78 %
Platelets: 370 10*3/uL (ref 150–400)
RBC: 4.17 MIL/uL (ref 3.87–5.11)
RDW: 17.2 % — ABNORMAL HIGH (ref 11.5–15.5)
WBC: 7.8 10*3/uL (ref 4.0–10.5)
nRBC: 0 % (ref 0.0–0.2)

## 2021-11-03 LAB — COMPREHENSIVE METABOLIC PANEL
ALT: 21 U/L (ref 0–44)
AST: 19 U/L (ref 15–41)
Albumin: 3.4 g/dL — ABNORMAL LOW (ref 3.5–5.0)
Alkaline Phosphatase: 68 U/L (ref 38–126)
Anion gap: 9 (ref 5–15)
BUN: 17 mg/dL (ref 8–23)
CO2: 27 mmol/L (ref 22–32)
Calcium: 8.4 mg/dL — ABNORMAL LOW (ref 8.9–10.3)
Chloride: 100 mmol/L (ref 98–111)
Creatinine, Ser: 0.79 mg/dL (ref 0.44–1.00)
GFR, Estimated: >60 mL/min (ref >60)
Glucose, Bld: 143 mg/dL — ABNORMAL HIGH (ref 70–99)
Potassium: 3.6 mmol/L (ref 3.5–5.1)
Sodium: 136 mmol/L (ref 135–145)
Total Bilirubin: 0.2 mg/dL — ABNORMAL LOW (ref 0.3–1.2)
Total Protein: 6.9 g/dL (ref 6.5–8.1)

## 2021-11-03 LAB — TROPONIN I (HIGH SENSITIVITY)
Troponin I (High Sensitivity): 19 ng/L — ABNORMAL HIGH (ref <18)
Troponin I (High Sensitivity): 15 ng/L (ref <18)
Troponin I (High Sensitivity): 16 ng/L (ref <18)

## 2021-11-03 LAB — MAGNESIUM: Magnesium: 2.4 mg/dL (ref 1.7–2.4)

## 2021-11-03 LAB — PHOSPHORUS: Phosphorus: 3.1 mg/dL (ref 2.5–4.6)

## 2021-11-03 MED ORDER — GABAPENTIN 300 MG PO CAPS: 300.0000 mg | ORAL_CAPSULE | Freq: Four times a day (QID) | ORAL | Status: DC | PRN

## 2021-11-03 MED ORDER — IPRATROPIUM-ALBUTEROL 0.5-2.5 (3) MG/3ML IN SOLN
3.0000 mL | Freq: Three times a day (TID) | RESPIRATORY_TRACT | Status: DC
  Administered 2021-11-04 – 2021-11-07 (×11): 3 mL via RESPIRATORY_TRACT
  Filled 2021-11-03 (×11): qty 3

## 2021-11-03 MED ORDER — LOPERAMIDE HCL 2 MG PO CAPS
2.0000 mg | ORAL_CAPSULE | ORAL | Status: DC | PRN
  Administered 2021-11-03 – 2021-11-06 (×2): 2 mg via ORAL
  Filled 2021-11-03 (×2): qty 1

## 2021-11-03 MED ORDER — DM-GUAIFENESIN ER 30-600 MG PO TB12
1.0000 | ORAL_TABLET | Freq: Two times a day (BID) | ORAL | Status: DC
  Administered 2021-11-03 – 2021-11-07 (×8): 1 via ORAL
  Filled 2021-11-03 (×8): qty 1

## 2021-11-03 MED ORDER — SUCRALFATE 1 GM/10ML PO SUSP
1.0000 g | Freq: Three times a day (TID) | ORAL | Status: DC
  Administered 2021-11-03 – 2021-11-07 (×16): 1 g via ORAL
  Filled 2021-11-03 (×15): qty 10

## 2021-11-03 MED ORDER — HYDROCODONE-ACETAMINOPHEN 5-325 MG PO TABS
1.0000 | ORAL_TABLET | Freq: Every evening | ORAL | Status: DC | PRN
  Administered 2021-11-03 – 2021-11-06 (×4): 1 via ORAL
  Filled 2021-11-03 (×6): qty 1

## 2021-11-03 MED ORDER — MORPHINE SULFATE (PF) 2 MG/ML IV SOLN
2.0000 mg | INTRAVENOUS | Status: DC | PRN
  Administered 2021-11-03 – 2021-11-07 (×7): 2 mg via INTRAVENOUS
  Filled 2021-11-03 (×7): qty 1

## 2021-11-03 MED ORDER — KETOROLAC TROMETHAMINE 30 MG/ML IJ SOLN
30.0000 mg | Freq: Once | INTRAMUSCULAR | Status: AC
  Administered 2021-11-03: 30 mg via INTRAVENOUS
  Filled 2021-11-03: qty 1

## 2021-11-03 MED ORDER — METHYLPREDNISOLONE SODIUM SUCC 125 MG IJ SOLR
60.0000 mg | Freq: Two times a day (BID) | INTRAMUSCULAR | Status: DC
  Administered 2021-11-04 – 2021-11-07 (×7): 60 mg via INTRAVENOUS
  Filled 2021-11-03 (×7): qty 2

## 2021-11-03 MED ORDER — PANTOPRAZOLE SODIUM 40 MG IV SOLR
40.0000 mg | Freq: Two times a day (BID) | INTRAVENOUS | Status: DC
  Administered 2021-11-03 – 2021-11-07 (×8): 40 mg via INTRAVENOUS
  Filled 2021-11-03 (×8): qty 40

## 2021-11-03 NOTE — Progress Notes (Addendum)
PROGRESS NOTE    [name]  GQQ:[id number] DOB: 1946-01-20 DOA: 11/01/2021 PCP: [place], [name], MD     Brief Narrative:  [name] is a 76 y.o.WF PMHx  COPD on home oxygen 2 L O2 via [place], Chronic Systolic CHF last EF measured in October 2022 was 57 to 60% with grade 2 diastolic dysfunction, peripheral vascular disease status post subclavian stenting and Hx  mesenteric ischemia, hypothyroidism HLD  Presents to the ER because of worsening shortness of breath.  Patient states she has been progressively getting short of breath for the last 3 weeks and has been having cough unable to bring her phlegm.  Denies any chest pain.   ED Course: In the ER patient has been found to be diffusely wheezing with chest x-ray showing possible infiltrates.  Patient is afebrile.  EKG shows nonspecific findings.  Patient was given nebulizer steroids and admitted for further work-up.   Subjective: 1/20 afebrile overnight, A/O x4, acute on chronic respiratory failure with hypoxia.  States has gained 10 pounds in 30 days despite taking additional fluid pills.    Assessment & Plan: Covid vaccination;   Principal Problem:   COPD exacerbation ([place]) Active Problems:   Hypothyroidism   Essential hypertension   Systolic CHF, chronic (HCC)   Chronic back pain   Systolic and diastolic CHF, chronic (HCC)   PVD (peripheral vascular disease) (HCC)   OSA on CPAP   Chronic diarrhea   GERD (gastroesophageal reflux disease)  Chronic back pain -1/20 gabapentin 300 mgQID (home dose) -1/20 morphine PRN back pain and SOB - 1/20 Norco 5/325 mg nightly (home dose) -1/20 Toradol IV 30 mg x 1  Acute on chronic respiratory failure with hypoxia/COPD exacerbation - Baseline O2 requirement 2 L O2 via [place] - Solu-Medrol 60 mg BID -DuoNeb QID -Flutter valve - Incentive spirometry - Mucinex BID -Continuous pulse ox -Titrate O2 to maintain SPO2> 92% -Complete 7-day course antibiotics  Chronic systolic and  diastolic CHF -Trend troponin.  Patient feels as if she is gaining weight and chest discomfort - Strict in and out - Daily weight - Ascites?  US abdomen  Hx PVD -S/p subclavian stenting and Hx of mesenteric ischemia presently on aspirin, Plavix, statins.  Hypothyroidism - Synthroid  HLD -  OSA - CPAP+ 2 L O2    Chronic diarrhea - 1/20 pancreatic elastase panel pending - 1/20 occult blood pending - 1/20 lactoferrin pending - 1/20 stool sodium, potassium pending - 1/20 stool fecal fat pending - 1/20 stool osmolality pending - 1/20 GI panel by PCR pending - 1/20 loperamide PRN  GERD - Protonix IV 40 mg BID - Carafate dispensing 1 [name] qac/qhs         DVT prophylaxis: Lovenox Code Status: DNR Family Communication:  Status is: Inpatient    Dispo: The patient is from: Home              Anticipated d/c is to: Home              Anticipated d/c date is: > 3 days              Patient currently is not medically stable to d/c.      Consultants:    Procedures/Significant Events:    I have personally reviewed and interpreted all radiology studies and my findings are as above.  VENTILATOR SETTINGS: Nasal cannula 1/20 Flow 4 L/min SPO2 93%   Cultures 1/18 SARS coronavirus negative 1/18 influenza A/B negative 1/19 respiratory  virus panel negative   Antimicrobials: Anti-infectives (From admission, onward)    Start     Dose/Rate Route Frequency Ordered Stop   11/03/21 0600  azithromycin (ZITHROMAX) tablet 500 mg       See Hyperspace for full Linked Orders Report.   500 mg Oral Daily 11/02/21 0148 11/07/21 0959   11/02/21 0400  cefTRIAXone (ROCEPHIN) 2 [name] in sodium chloride 0.9 % 100 mL IVPB        2 [name] 200 mL/hr over 30 Minutes Intravenous Every 24 hours 11/02/21 0312     11/02/21 0200  azithromycin (ZITHROMAX) 500 mg in sodium chloride 0.9 % 250 mL IVPB       See Hyperspace for full Linked Orders Report.   500 mg 250 mL/hr over 60 Minutes Intravenous  Every 24 hours 11/02/21 0148 11/02/21 0440         Devices    LINES / TUBES:      Continuous Infusions:  cefTRIAXone (ROCEPHIN)  IV 2 [name] (11/04/21 0500)     Objective: Vitals:   11/04/21 0729 11/04/21 1100 11/04/21 1436 11/04/21 1449  BP:    119/65  Pulse:    80  Resp:    20  Temp:    98 F (36.7 C)  TempSrc:    Oral  SpO2: 92% 90% 92% 92%  Weight:      Height:        Intake/Output Summary (Last 24 hours) at 11/04/2021 1525 Last data filed at 11/04/2021 1300 Gross per 24 hour  Intake 557 ml  Output 3450 ml  Net -2893 ml   Filed Weights   11/01/21 2229 11/04/21 0558  Weight: 61.2 kg 62.9 kg    Examination: Physical Exam:  General: A/O x4, positive acute on chronic acute respiratory distress, cachectic, hard of hearing uses hearing aids Eyes: negative scleral hemorrhage, negative anisocoria, negative icterus ENT: Negative Runny nose, negative gingival bleeding, Neck:  Negative scars, masses, torticollis, lymphadenopathy, JVD Lungs: tachypneic diffuse decreased breath sounds, inspiratory and expiratory diffuse wheezes, negative crackles Cardiovascular: Tachycardic without murmur gallop or rub normal S1 and S2 Abdomen: negative abdominal pain, positive distention, positive soft, bowel sounds, no rebound, no ascites, no appreciable mass Extremities: No significant cyanosis, clubbing, or edema bilateral lower extremities Skin: Negative rashes, lesions, ulcers Psychiatric:  Negative depression, negative anxiety, negative fatigue, negative mania  Central nervous system:  Cranial nerves II through XII intact, tongue/uvula midline, all extremities muscle strength 5/5, sensation intact throughout, negative dysarthria, negative expressive aphasia, negative receptive aphasia.    .     Data Reviewed: Care during the described time interval was provided by me .  I have reviewed this patient's available data, including medical history, events of note, physical  examination, and all test results as part of my evaluation.  CBC: Recent Labs  Lab 11/01/21 2259 11/02/21 0434 11/03/21 0501 11/04/21 0526  WBC 6.7 4.2 7.8 6.6  NEUTROABS 5.4  --  6.1 4.8  HGB 12.5 12.6 12.4 13.1  HCT 38.5 38.3 38.2 40.6  MCV 93.0 91.8 91.6 92.5  PLT 359 378 370 409*   Basic Metabolic Panel: Recent Labs  Lab 11/01/21 2259 11/01/21 2303 11/02/21 0434 11/03/21 0501 11/04/21 0526  NA 135  --  134* 136 136  K 3.2*  --  4.0 3.6 4.3  CL 97*  --  99 100 99  CO2 25  --  24 27 30   GLUCOSE 128*  --  183* 143* 93  BUN 15  --  18 17 20   CREATININE 0.90 0.96 0.86 0.79 0.69  CALCIUM 8.5*  --  8.7* 8.4* 9.0  MG  --   --   --  2.4 2.3  PHOS  --   --   --  3.1 4.6   GFR: Estimated Creatinine Clearance: 56 mL/min (by C-[name] formula based on SCr of 0.69 mg/dL). Liver Function Tests: Recent Labs  Lab 11/03/21 0501 11/04/21 0526  AST 19 16  ALT 21 20  ALKPHOS 68 72  BILITOT 0.2* 0.5  PROT 6.9 6.9  ALBUMIN 3.4* 3.6   No results for input(s): LIPASE, AMYLASE in the last 168 hours. No results for input(s): AMMONIA in the last 168 hours. Coagulation Profile: No results for input(s): INR, PROTIME in the last 168 hours. Cardiac Enzymes: No results for input(s): CKTOTAL, CKMB, CKMBINDEX, TROPONINI in the last 168 hours. BNP (last 3 results) No results for input(s): PROBNP in the last 8760 hours. HbA1C: No results for input(s): HGBA1C in the last 72 hours. CBG: No results for input(s): GLUCAP in the last 168 hours. Lipid Profile: No results for input(s): CHOL, HDL, LDLCALC, TRIG, CHOLHDL, LDLDIRECT in the last 72 hours. Thyroid Function Tests: No results for input(s): TSH, T4TOTAL, FREET4, T3FREE, THYROIDAB in the last 72 hours. Anemia Panel: No results for input(s): VITAMINB12, FOLATE, FERRITIN, TIBC, IRON, RETICCTPCT in the last 72 hours. Sepsis Labs: No results for input(s): PROCALCITON, LATICACIDVEN in the last 168 hours.  Recent Results (from the past 240  hour(s))  Resp Panel by RT-PCR (Flu A&B, Covid) Nasopharyngeal Swab     Status: None   Collection Time: 11/01/21 10:33 PM   Specimen: Nasopharyngeal Swab; Nasopharyngeal(NP) swabs in vial transport medium  Result Value Ref Range Status   SARS Coronavirus 2 by RT PCR NEGATIVE NEGATIVE Final    Comment: (NOTE) SARS-CoV-2 target nucleic acids are NOT DETECTED.  The SARS-CoV-2 RNA is generally detectable in upper respiratory specimens during the acute phase of infection. The lowest concentration of SARS-CoV-2 viral copies this assay can detect is 138 copies/mL. A negative result does not preclude SARS-Cov-2 infection and should not be used as the sole basis for treatment or other patient management decisions. A negative result may occur with  improper specimen collection/handling, submission of specimen other than nasopharyngeal swab, presence of viral mutation(s) within the areas targeted by this assay, and inadequate number of viral copies(<138 copies/mL). A negative result must be combined with clinical observations, patient history, and epidemiological information. The expected result is Negative.  Fact Sheet for Patients:  EntrepreneurPulse.com.au  Fact Sheet for Healthcare Providers:  IncredibleEmployment.be  This test is no t yet approved or cleared by the [place] FDA and  has been authorized for detection and/or diagnosis of SARS-CoV-2 by FDA under an Emergency Use Authorization (EUA). This EUA will remain  in effect (meaning this test can be used) for the duration of the COVID-19 declaration under Section 564(b)(1) of the Act, 21 U.S.C.section 360bbb-3(b)(1), unless the authorization is terminated  or revoked sooner.       Influenza A by PCR NEGATIVE NEGATIVE Final   Influenza B by PCR NEGATIVE NEGATIVE Final    Comment: (NOTE) The Xpert Xpress SARS-CoV-2/FLU/RSV plus assay is intended as an aid in the diagnosis of influenza from  Nasopharyngeal swab specimens and should not be used as a sole basis for treatment. Nasal washings and aspirates are unacceptable for Xpert Xpress SARS-CoV-2/FLU/RSV testing.  Fact Sheet for Patients: EntrepreneurPulse.com.au  Fact Sheet for Healthcare Providers: IncredibleEmployment.be  This test is not yet approved or cleared by the [place] and has been authorized for detection and/or diagnosis of SARS-CoV-2 by FDA under an Emergency Use Authorization (EUA). This EUA will remain in effect (meaning this test can be used) for the duration of the COVID-19 declaration under Section 564(b)(1) of the Act, 21 U.S.C. section 360bbb-3(b)(1), unless the authorization is terminated or revoked.  Performed at [facility], [place] [address], [place], [place] [address]   Respiratory (~20 pathogens) panel by PCR     Status: None   Collection Time: 11/02/21  5:44 PM   Specimen: Nasopharyngeal Swab; Respiratory  Result Value Ref Range Status   Adenovirus NOT DETECTED NOT DETECTED Final   Coronavirus 229E NOT DETECTED NOT DETECTED Final    Comment: (NOTE) The Coronavirus on the Respiratory Panel, DOES NOT test for the novel  Coronavirus (2019 nCoV)    Coronavirus HKU1 NOT DETECTED NOT DETECTED Final   Coronavirus NL63 NOT DETECTED NOT DETECTED Final   Coronavirus OC43 NOT DETECTED NOT DETECTED Final   Metapneumovirus NOT DETECTED NOT DETECTED Final   Rhinovirus / Enterovirus NOT DETECTED NOT DETECTED Final   Influenza A NOT DETECTED NOT DETECTED Final   Influenza B NOT DETECTED NOT DETECTED Final   Parainfluenza Virus 1 NOT DETECTED NOT DETECTED Final   Parainfluenza Virus 2 NOT DETECTED NOT DETECTED Final   Parainfluenza Virus 3 NOT DETECTED NOT DETECTED Final   Parainfluenza Virus 4 NOT DETECTED NOT DETECTED Final   Respiratory Syncytial Virus NOT DETECTED NOT DETECTED Final   Bordetella pertussis NOT DETECTED NOT DETECTED  Final   Bordetella Parapertussis NOT DETECTED NOT DETECTED Final   Chlamydophila pneumoniae NOT DETECTED NOT DETECTED Final   Mycoplasma pneumoniae NOT DETECTED NOT DETECTED Final    Comment: Performed at [facility], [place]. [address] [place], [place] [address]  Gastrointestinal Panel by PCR , Stool     Status: None   Collection Time: 11/03/21  3:12 PM   Specimen: Stool  Result Value Ref Range Status   Campylobacter species NOT DETECTED NOT DETECTED Final   Plesimonas shigelloides NOT DETECTED NOT DETECTED Final   Salmonella species NOT DETECTED NOT DETECTED Final   Yersinia enterocolitica NOT DETECTED NOT DETECTED Final   Vibrio species NOT DETECTED NOT DETECTED Final   Vibrio cholerae NOT DETECTED NOT DETECTED Final   Enteroaggregative E coli (EAEC) NOT DETECTED NOT DETECTED Final   Enteropathogenic E coli (EPEC) NOT DETECTED NOT DETECTED Final   Enterotoxigenic E coli (ETEC) NOT DETECTED NOT DETECTED Final   Shiga like toxin producing E coli (STEC) NOT DETECTED NOT DETECTED Final   Shigella/Enteroinvasive E coli (EIEC) NOT DETECTED NOT DETECTED Final   Cryptosporidium NOT DETECTED NOT DETECTED Final   Cyclospora cayetanensis NOT DETECTED NOT DETECTED Final   Entamoeba histolytica NOT DETECTED NOT DETECTED Final   Giardia lamblia NOT DETECTED NOT DETECTED Final   Adenovirus F40/41 NOT DETECTED NOT DETECTED Final   Astrovirus NOT DETECTED NOT DETECTED Final   Norovirus GI/GII NOT DETECTED NOT DETECTED Final   Rotavirus A NOT DETECTED NOT DETECTED Final   Sapovirus (I, II, IV, and V) NOT DETECTED NOT DETECTED Final    Comment: Performed at [facility], [address] [place], [place] [address]         Radiology Studies: [place] ASCITES (ABDOMEN LIMITED)  Result Date: 11/03/2021 CLINICAL DATA:  Assess for ascites EXAM: LIMITED ABDOMEN ULTRASOUND FOR ASCITES TECHNIQUE: Limited ultrasound survey for ascites was  performed in all four abdominal quadrants. COMPARISON:   CT 07/31/2021 FINDINGS: No significant ascites is seen. IMPRESSION: Negative.  No significant ascites. Electronically Signed   By: Donavan Foil M.D.   On: 11/03/2021 23:28        Scheduled Meds:  aspirin EC  81 mg Oral Daily   atorvastatin  80 mg Oral Daily   azithromycin  500 mg Oral Daily   budesonide (PULMICORT) nebulizer solution  0.5 mg Nebulization BID   cilostazol  100 mg Oral BID   clopidogrel  75 mg Oral Daily   dextromethorphan-guaiFENesin  1 tablet Oral BID   enoxaparin (LOVENOX) injection  40 mg Subcutaneous Q24H   ferrous sulfate  325 mg Oral Q breakfast   furosemide  40 mg Oral BID   ipratropium-albuterol  3 mL Nebulization TID   levothyroxine  112 mcg Oral Q0600   LORazepam  2 mg Oral QHS   methylPREDNISolone (SOLU-MEDROL) injection  60 mg Intravenous Q12H   montelukast  10 mg Oral QHS   pantoprazole (PROTONIX) IV  40 mg Intravenous Q12H   PARoxetine  20 mg Oral QHS   potassium chloride SA  20 mEq Oral TID   sucralfate  1 [name] Oral TID WC & HS   tamsulosin  0.4 mg Oral Daily   traZODone  100 mg Oral QHS   Continuous Infusions:  cefTRIAXone (ROCEPHIN)  IV 2 [name] (11/04/21 0500)     LOS: 2 days    Time spent:0 min    [name], Geraldo Docker, MD [facility]   If 7PM-7AM, please contact night-coverage 11/04/2021, 3:25 PM

## 2021-11-04 DIAGNOSIS — M549 Dorsalgia, unspecified: Secondary | ICD-10-CM | POA: Diagnosis present

## 2021-11-04 DIAGNOSIS — G8929 Other chronic pain: Secondary | ICD-10-CM | POA: Diagnosis present

## 2021-11-04 DIAGNOSIS — G4733 Obstructive sleep apnea (adult) (pediatric): Secondary | ICD-10-CM

## 2021-11-04 DIAGNOSIS — I739 Peripheral vascular disease, unspecified: Secondary | ICD-10-CM | POA: Diagnosis present

## 2021-11-04 DIAGNOSIS — K529 Noninfective gastroenteritis and colitis, unspecified: Secondary | ICD-10-CM | POA: Diagnosis present

## 2021-11-04 DIAGNOSIS — K219 Gastro-esophageal reflux disease without esophagitis: Secondary | ICD-10-CM | POA: Diagnosis present

## 2021-11-04 DIAGNOSIS — I5042 Chronic combined systolic (congestive) and diastolic (congestive) heart failure: Secondary | ICD-10-CM | POA: Diagnosis present

## 2021-11-04 LAB — COMPREHENSIVE METABOLIC PANEL
ALT: 20 U/L (ref 0–44)
AST: 16 U/L (ref 15–41)
Albumin: 3.6 g/dL (ref 3.5–5.0)
Alkaline Phosphatase: 72 U/L (ref 38–126)
Anion gap: 7 (ref 5–15)
BUN: 20 mg/dL (ref 8–23)
CO2: 30 mmol/L (ref 22–32)
Calcium: 9 mg/dL (ref 8.9–10.3)
Chloride: 99 mmol/L (ref 98–111)
Creatinine, Ser: 0.69 mg/dL (ref 0.44–1.00)
GFR, Estimated: >60 mL/min (ref >60)
Glucose, Bld: 93 mg/dL (ref 70–99)
Potassium: 4.3 mmol/L (ref 3.5–5.1)
Sodium: 136 mmol/L (ref 135–145)
Total Bilirubin: 0.5 mg/dL (ref 0.3–1.2)
Total Protein: 6.9 g/dL (ref 6.5–8.1)

## 2021-11-04 LAB — CBC WITH DIFFERENTIAL/PLATELET
Abs Immature Granulocytes: 0.03 10*3/uL (ref 0.00–0.07)
Basophils Absolute: 0 10*3/uL (ref 0.0–0.1)
Basophils Relative: 0 %
Eosinophils Absolute: 0 10*3/uL (ref 0.0–0.5)
Eosinophils Relative: 0 %
HCT: 40.6 % (ref 36.0–46.0)
Hemoglobin: 13.1 g/dL (ref 12.0–15.0)
Immature Granulocytes: 1 %
Lymphocytes Relative: 14 %
Lymphs Abs: 0.9 10*3/uL (ref 0.7–4.0)
MCH: 29.8 pg (ref 26.0–34.0)
MCHC: 32.3 g/dL (ref 30.0–36.0)
MCV: 92.5 fL (ref 80.0–100.0)
Monocytes Absolute: 0.7 10*3/uL (ref 0.1–1.0)
Monocytes Relative: 11 %
Neutro Abs: 4.8 10*3/uL (ref 1.7–7.7)
Neutrophils Relative %: 74 %
Platelets: 407 10*3/uL — ABNORMAL HIGH (ref 150–400)
RBC: 4.39 MIL/uL (ref 3.87–5.11)
RDW: 16.8 % — ABNORMAL HIGH (ref 11.5–15.5)
WBC: 6.6 10*3/uL (ref 4.0–10.5)
nRBC: 0 % (ref 0.0–0.2)

## 2021-11-04 LAB — LACTOFERRIN, FECAL, QUALITATIVE: Lactoferrin, Fecal, Qual: NEGATIVE

## 2021-11-04 LAB — GASTROINTESTINAL PANEL BY PCR, STOOL (REPLACES STOOL CULTURE)

## 2021-11-04 LAB — PHOSPHORUS: Phosphorus: 4.6 mg/dL (ref 2.5–4.6)

## 2021-11-04 LAB — MAGNESIUM: Magnesium: 2.3 mg/dL (ref 1.7–2.4)

## 2021-11-04 NOTE — Progress Notes (Signed)
°   11/04/21 1100  Oxygen Therapy  SpO2 90 %  O2 Device Nasal Cannula  O2 Flow Rate (L/min) 3 L/min  Patient Activity (if Appropriate) Ambulating  Mobility  Activity Ambulated with assistance in hallway  Level of Assistance Standby assist, set-up cues, supervision of patient - no hands on  Assistive Device Other (Comment) (IV pole)  Distance Ambulated (ft) 250 ft  Activity Response Tolerated well  $Mobility charge 1 Mobility   Pt agreeable to mobilize this morning. Ambulated about 280ft in hall on 3L [place] with IV pole, tolerated well. Had several coughing fits during walk, saying she had to "get something out". Left pt in bed with call bell at side. RN notified of session.  Mobility Specialist - Progress Note Pre-mobility: SpO2 95% During mobility: SpO2 89-92% Post-mobility:  SPO2 92%  [place] Office: [phone]

## 2021-11-04 NOTE — Progress Notes (Signed)
PROGRESS NOTE    [name]  OEV:[id number] DOB: 10-31-1945 DOA: 11/01/2021 PCP: [place], [name], MD     Brief Narrative:  [name] is a 76 y.o.WF PMHx  COPD on home oxygen 2 L O2 via [place], Chronic Systolic CHF last EF measured in October 2022 was 66 to 60% with grade 2 diastolic dysfunction, peripheral vascular disease status post subclavian stenting and Hx  mesenteric ischemia, hypothyroidism HLD  Presents to the ER because of worsening shortness of breath.  Patient states she has been progressively getting short of breath for the last 3 weeks and has been having cough unable to bring her phlegm.  Denies any chest pain.   ED Course: In the ER patient has been found to be diffusely wheezing with chest x-ray showing possible infiltrates.  Patient is afebrile.  EKG shows nonspecific findings.  Patient was given nebulizer steroids and admitted for further work-up.   Subjective: 1/21 afebrile overnight A/O x4, continued acute on chronic respiratory failure with hypoxia.  Slight improvement able to order her meals on the phone.    Assessment & Plan: Covid vaccination;   Principal Problem:   COPD exacerbation ([place]) Active Problems:   Hypothyroidism   Essential hypertension   Systolic CHF, chronic (HCC)   Chronic back pain   Systolic and diastolic CHF, chronic (HCC)   PVD (peripheral vascular disease) (HCC)   OSA on CPAP   Chronic diarrhea   GERD (gastroesophageal reflux disease)  Chronic back pain -1/20 gabapentin 300 mgQID (home dose) -1/20 morphine PRN back pain and SOB - 1/20 Norco 5/325 mg nightly (home dose) -1/20 Toradol IV 30 mg x 1  Acute on chronic respiratory failure with hypoxia/COPD exacerbation - Baseline O2 requirement 2 L O2 via [place] - Solu-Medrol 60 mg BID -DuoNeb QID -Flutter valve - Incentive spirometry - Mucinex BID -Continuous pulse ox -Titrate O2 to maintain SPO2> 92% -Complete 7-day course antibiotics  Chronic systolic and diastolic  CHF -Trend troponin.  Patient feels as if she is gaining weight and chest discomfort - Strict in and out -4.6 L - Daily weight Filed Weights   11/01/21 2229 11/04/21 0558  Weight: 61.2 kg 62.9 kg    - 1/20 US abdomen: Negative ascites  Hx PVD -S/p subclavian stenting and Hx of mesenteric ischemia presently on aspirin, Plavix, statins.  Hypothyroidism - Synthroid 112 mcg daily  HLD -  OSA - CPAP+ 2 L O2    Chronic diarrhea - 1/20 pancreatic elastase panel pending - 1/20 occult blood pending - 1/20 lactoferrin negative - 1/20 stool sodium, potassium pending - 1/20 stool fecal fat pending - 1/20 stool osmolality pending - 1/20 GI panel by PCR negative - 1/20 loperamide PRN  GERD - Protonix IV 40 mg BID - Carafate dispensing 1 g qac/qhs         DVT prophylaxis: Lovenox Code Status: DNR Family Communication:  Status is: Inpatient    Dispo: The patient is from: Home              Anticipated d/c is to: Home              Anticipated d/c date is: > 3 days              Patient currently is not medically stable to d/c.      Consultants:    Procedures/Significant Events:  1/20 US abdomen;-Negative ascites    I have personally reviewed and interpreted all radiology studies and my findings are  as above.  VENTILATOR SETTINGS: Nasal cannula 1/20 Flow 3 L/min SPO2 92%    Cultures 1/18 SARS coronavirus negative 1/18 influenza A/B negative 1/19 respiratory virus panel negative   Antimicrobials: Anti-infectives (From admission, onward)    Start     Ordered Stop   11/03/21 0600  azithromycin (ZITHROMAX) tablet 500 mg       See Hyperspace for full Linked Orders Report.   11/02/21 0148 11/07/21 0959   11/02/21 0400  cefTRIAXone (ROCEPHIN) 2 g in sodium chloride 0.9 % 100 mL IVPB        11/02/21 0312     11/02/21 0200  azithromycin (ZITHROMAX) 500 mg in sodium chloride 0.9 % 250 mL IVPB       See Hyperspace for full Linked Orders Report.    11/02/21 0148 11/02/21 0440         Devices    LINES / TUBES:      Continuous Infusions:  cefTRIAXone (ROCEPHIN)  IV 2 g (11/04/21 0500)     Objective: Vitals:   11/04/21 0729 11/04/21 1100 11/04/21 1436 11/04/21 1449  BP:    119/65  Pulse:    80  Resp:    20  Temp:    98 F (36.7 C)  TempSrc:    Oral  SpO2: 92% 90% 92% 92%  Weight:      Height:        Intake/Output Summary (Last 24 hours) at 11/04/2021 1527 Last data filed at 11/04/2021 1300 Gross per 24 hour  Intake 557 ml  Output 3450 ml  Net -2893 ml    Filed Weights   11/01/21 2229 11/04/21 0558  Weight: 61.2 kg 62.9 kg    Examination: Physical Exam:  General: A/O x4, positive acute on chronic acute respiratory distress, cachectic, hard of hearing uses hearing aids Eyes: negative scleral hemorrhage, negative anisocoria, negative icterus ENT: Negative Runny nose, negative gingival bleeding, Neck:  Negative scars, masses, torticollis, lymphadenopathy, JVD Lungs: tachypneic diffuse decreased breath sounds, inspiratory and expiratory diffuse wheezes, negative crackles Cardiovascular: Tachycardic without murmur gallop or rub normal S1 and S2 Abdomen: negative abdominal pain, positive distention, positive soft, bowel sounds, no rebound, no ascites, no appreciable mass Extremities: No significant cyanosis, clubbing, or edema bilateral lower extremities Skin: Negative rashes, lesions, ulcers Psychiatric:  Negative depression, negative anxiety, negative fatigue, negative mania  Central nervous system:  Cranial nerves II through XII intact, tongue/uvula midline, all extremities muscle strength 5/5, sensation intact throughout, negative dysarthria, negative expressive aphasia, negative receptive aphasia.    .     Data Reviewed: Care during the described time interval was provided by me .  I have reviewed this patient's available data, including medical history, events of note, physical examination, and all  test results as part of my evaluation.  CBC: Recent Labs  Lab 11/01/21 2259 11/02/21 0434 11/03/21 0501 11/04/21 0526  WBC 6.7 4.2 7.8 6.6  NEUTROABS 5.4  --  6.1 4.8  HGB 12.5 12.6 12.4 13.1  HCT 38.5 38.3 38.2 40.6  MCV 93.0 91.8 91.6 92.5  PLT 359 378 370 407*    Basic Metabolic Panel: Recent Labs  Lab 11/01/21 2259 11/01/21 2303 11/02/21 0434 11/03/21 0501 11/04/21 0526  NA 135  --  134* 136 136  K 3.2*  --  4.0 3.6 4.3  CL 97*  --  99 100 99  CO2 25  --  24 27 30   GLUCOSE 128*  --  183* 143* 93  BUN 15  --  18 17 20   CREATININE 0.90 0.96 0.86 0.79 0.69  CALCIUM 8.5*  --  8.7* 8.4* 9.0  MG  --   --   --  2.4 2.3  PHOS  --   --   --  3.1 4.6    GFR: Estimated Creatinine Clearance: 56 mL/min (by C-G formula based on SCr of 0.69 mg/dL). Liver Function Tests: Recent Labs  Lab 11/03/21 0501 11/04/21 0526  AST 19 16  ALT 21 20  ALKPHOS 68 72  BILITOT 0.2* 0.5  PROT 6.9 6.9  ALBUMIN 3.4* 3.6    No results for input(s): LIPASE, AMYLASE in the last 168 hours. No results for input(s): AMMONIA in the last 168 hours. Coagulation Profile: No results for input(s): INR, PROTIME in the last 168 hours. Cardiac Enzymes: No results for input(s): CKTOTAL, CKMB, CKMBINDEX, TROPONINI in the last 168 hours. BNP (last 3 results) No results for input(s): PROBNP in the last 8760 hours. HbA1C: No results for input(s): HGBA1C in the last 72 hours. CBG: No results for input(s): GLUCAP in the last 168 hours. Lipid Profile: No results for input(s): CHOL, HDL, LDLCALC, TRIG, CHOLHDL, LDLDIRECT in the last 72 hours. Thyroid Function Tests: No results for input(s): TSH, T4TOTAL, FREET4, T3FREE, THYROIDAB in the last 72 hours. Anemia Panel: No results for input(s): VITAMINB12, FOLATE, FERRITIN, TIBC, IRON, RETICCTPCT in the last 72 hours. Sepsis Labs: No results for input(s): PROCALCITON, LATICACIDVEN in the last 168 hours.  Recent Results (from the past 240 hour(s))  Resp  Panel by RT-PCR (Flu A&B, Covid) Nasopharyngeal Swab     Status: None   Collection Time: 11/01/21 10:33 PM   Specimen: Nasopharyngeal Swab; Nasopharyngeal(NP) swabs in vial transport medium  Result Value Ref Range Status   SARS Coronavirus 2 by RT PCR NEGATIVE NEGATIVE Final    Comment: (NOTE) SARS-CoV-2 target nucleic acids are NOT DETECTED.  The SARS-CoV-2 RNA is generally detectable in upper respiratory specimens during the acute phase of infection. The lowest concentration of SARS-CoV-2 viral copies this assay can detect is 138 copies/mL. A negative result does not preclude SARS-Cov-2 infection and should not be used as the sole basis for treatment or other patient management decisions. A negative result may occur with  improper specimen collection/handling, submission of specimen other than nasopharyngeal swab, presence of viral mutation(s) within the areas targeted by this assay, and inadequate number of viral copies(<138 copies/mL). A negative result must be combined with clinical observations, patient history, and epidemiological information. The expected result is Negative.  Fact Sheet for Patients:  EntrepreneurPulse.com.au  Fact Sheet for Healthcare Providers:  IncredibleEmployment.be  This test is no t yet approved or cleared by the [place] FDA and  has been authorized for detection and/or diagnosis of SARS-CoV-2 by FDA under an Emergency Use Authorization (EUA). This EUA will remain  in effect (meaning this test can be used) for the duration of the COVID-19 declaration under Section 564(b)(1) of the Act, 21 U.S.C.section 360bbb-3(b)(1), unless the authorization is terminated  or revoked sooner.       Influenza A by PCR NEGATIVE NEGATIVE Final   Influenza B by PCR NEGATIVE NEGATIVE Final    Comment: (NOTE) The Xpert Xpress SARS-CoV-2/FLU/RSV plus assay is intended as an aid in the diagnosis of influenza from Nasopharyngeal  swab specimens and should not be used as a sole basis for treatment. Nasal washings and aspirates are unacceptable for Xpert Xpress SARS-CoV-2/FLU/RSV testing.  Fact Sheet for Patients: EntrepreneurPulse.com.au  Fact Sheet for Healthcare Providers:  IncredibleEmployment.be  This test is not yet approved or cleared by the [place] and has been authorized for detection and/or diagnosis of SARS-CoV-2 by FDA under an Emergency Use Authorization (EUA). This EUA will remain in effect (meaning this test can be used) for the duration of the COVID-19 declaration under Section 564(b)(1) of the Act, 21 U.S.C. section 360bbb-3(b)(1), unless the authorization is terminated or revoked.  Performed at [facility], [place] [address], [place], [place] [address]   Respiratory (~20 pathogens) panel by PCR     Status: None   Collection Time: 11/02/21  5:44 PM   Specimen: Nasopharyngeal Swab; Respiratory  Result Value Ref Range Status   Adenovirus NOT DETECTED NOT DETECTED Final   Coronavirus 229E NOT DETECTED NOT DETECTED Final    Comment: (NOTE) The Coronavirus on the Respiratory Panel, DOES NOT test for the novel  Coronavirus (2019 nCoV)    Coronavirus HKU1 NOT DETECTED NOT DETECTED Final   Coronavirus NL63 NOT DETECTED NOT DETECTED Final   Coronavirus OC43 NOT DETECTED NOT DETECTED Final   Metapneumovirus NOT DETECTED NOT DETECTED Final   Rhinovirus / Enterovirus NOT DETECTED NOT DETECTED Final   Influenza A NOT DETECTED NOT DETECTED Final   Influenza B NOT DETECTED NOT DETECTED Final   Parainfluenza Virus 1 NOT DETECTED NOT DETECTED Final   Parainfluenza Virus 2 NOT DETECTED NOT DETECTED Final   Parainfluenza Virus 3 NOT DETECTED NOT DETECTED Final   Parainfluenza Virus 4 NOT DETECTED NOT DETECTED Final   Respiratory Syncytial Virus NOT DETECTED NOT DETECTED Final   Bordetella pertussis NOT DETECTED NOT DETECTED Final    Bordetella Parapertussis NOT DETECTED NOT DETECTED Final   Chlamydophila pneumoniae NOT DETECTED NOT DETECTED Final   Mycoplasma pneumoniae NOT DETECTED NOT DETECTED Final    Comment: Performed at [facility], [place]. [address], [place], [place] [address]  Gastrointestinal Panel by PCR , Stool     Status: None   Collection Time: 11/03/21  3:12 PM   Specimen: Stool  Result Value Ref Range Status   Campylobacter species NOT DETECTED NOT DETECTED Final   Plesimonas shigelloides NOT DETECTED NOT DETECTED Final   Salmonella species NOT DETECTED NOT DETECTED Final   Yersinia enterocolitica NOT DETECTED NOT DETECTED Final   Vibrio species NOT DETECTED NOT DETECTED Final   Vibrio cholerae NOT DETECTED NOT DETECTED Final   Enteroaggregative E coli (EAEC) NOT DETECTED NOT DETECTED Final   Enteropathogenic E coli (EPEC) NOT DETECTED NOT DETECTED Final   Enterotoxigenic E coli (ETEC) NOT DETECTED NOT DETECTED Final   Shiga like toxin producing E coli (STEC) NOT DETECTED NOT DETECTED Final   Shigella/Enteroinvasive E coli (EIEC) NOT DETECTED NOT DETECTED Final   Cryptosporidium NOT DETECTED NOT DETECTED Final   Cyclospora cayetanensis NOT DETECTED NOT DETECTED Final   Entamoeba histolytica NOT DETECTED NOT DETECTED Final   Giardia lamblia NOT DETECTED NOT DETECTED Final   Adenovirus F40/41 NOT DETECTED NOT DETECTED Final   Astrovirus NOT DETECTED NOT DETECTED Final   Norovirus GI/GII NOT DETECTED NOT DETECTED Final   Rotavirus A NOT DETECTED NOT DETECTED Final   Sapovirus (I, II, IV, and V) NOT DETECTED NOT DETECTED Final    Comment: Performed at [facility], [address] [place], [place] [address]          Radiology Studies: [place] ASCITES (ABDOMEN LIMITED)  Result Date: 11/03/2021 CLINICAL DATA:  Assess for ascites EXAM: LIMITED ABDOMEN ULTRASOUND FOR ASCITES TECHNIQUE: Limited ultrasound survey  for ascites was performed in all four abdominal quadrants. COMPARISON:  CT  07/31/2021 FINDINGS: No significant ascites is seen. IMPRESSION: Negative.  No significant ascites. Electronically Signed   By: Donavan Foil M.D.   On: 11/03/2021 23:28        Scheduled Meds:  aspirin EC  81 mg Oral Daily   atorvastatin  80 mg Oral Daily   azithromycin  500 mg Oral Daily   budesonide (PULMICORT) nebulizer solution  0.5 mg Nebulization BID   cilostazol  100 mg Oral BID   clopidogrel  75 mg Oral Daily   dextromethorphan-guaiFENesin  1 tablet Oral BID   enoxaparin (LOVENOX) injection  40 mg Subcutaneous Q24H   ferrous sulfate  325 mg Oral Q breakfast   furosemide  40 mg Oral BID   ipratropium-albuterol  3 mL Nebulization TID   levothyroxine  112 mcg Oral Q0600   LORazepam  2 mg Oral QHS   methylPREDNISolone (SOLU-MEDROL) injection  60 mg Intravenous Q12H   montelukast  10 mg Oral QHS   pantoprazole (PROTONIX) IV  40 mg Intravenous Q12H   PARoxetine  20 mg Oral QHS   potassium chloride SA  20 mEq Oral TID   sucralfate  1 g Oral TID WC & HS   tamsulosin  0.4 mg Oral Daily   traZODone  100 mg Oral QHS   Continuous Infusions:  cefTRIAXone (ROCEPHIN)  IV 2 g (11/04/21 0500)     LOS: 2 days    Time spent:0 min    [name], Geraldo Docker, MD [facility]   If 7PM-7AM, please contact night-coverage 11/04/2021, 3:27 PM

## 2021-11-05 LAB — CBC WITH DIFFERENTIAL/PLATELET
Abs Immature Granulocytes: 0.03 10*3/uL (ref 0.00–0.07)
Basophils Absolute: 0 10*3/uL (ref 0.0–0.1)
Basophils Relative: 0 %
Eosinophils Absolute: 0 10*3/uL (ref 0.0–0.5)
Eosinophils Relative: 1 %
HCT: 44.4 % (ref 36.0–46.0)
Hemoglobin: 14.2 g/dL (ref 12.0–15.0)
Immature Granulocytes: 1 %
Lymphocytes Relative: 12 %
Lymphs Abs: 0.7 10*3/uL (ref 0.7–4.0)
MCH: 29.8 pg (ref 26.0–34.0)
MCHC: 32 g/dL (ref 30.0–36.0)
MCV: 93.3 fL (ref 80.0–100.0)
Monocytes Absolute: 0.6 10*3/uL (ref 0.1–1.0)
Monocytes Relative: 10 %
Neutro Abs: 4.5 10*3/uL (ref 1.7–7.7)
Neutrophils Relative %: 76 %
Platelets: 372 10*3/uL (ref 150–400)
RBC: 4.76 MIL/uL (ref 3.87–5.11)
RDW: 16.6 % — ABNORMAL HIGH (ref 11.5–15.5)
WBC: 5.9 10*3/uL (ref 4.0–10.5)
nRBC: 0 % (ref 0.0–0.2)

## 2021-11-05 LAB — COMPREHENSIVE METABOLIC PANEL
ALT: 20 U/L (ref 0–44)
AST: 16 U/L (ref 15–41)
Albumin: 3.9 g/dL (ref 3.5–5.0)
Alkaline Phosphatase: 73 U/L (ref 38–126)
Anion gap: 7 (ref 5–15)
BUN: 23 mg/dL (ref 8–23)
CO2: 32 mmol/L (ref 22–32)
Calcium: 9 mg/dL (ref 8.9–10.3)
Chloride: 98 mmol/L (ref 98–111)
Creatinine, Ser: 0.85 mg/dL (ref 0.44–1.00)
GFR, Estimated: >60 mL/min (ref >60)
Glucose, Bld: 103 mg/dL — ABNORMAL HIGH (ref 70–99)
Potassium: 4.3 mmol/L (ref 3.5–5.1)
Sodium: 137 mmol/L (ref 135–145)
Total Bilirubin: 0.6 mg/dL (ref 0.3–1.2)
Total Protein: 7.3 g/dL (ref 6.5–8.1)

## 2021-11-05 LAB — MAGNESIUM: Magnesium: 2.4 mg/dL (ref 1.7–2.4)

## 2021-11-05 LAB — PHOSPHORUS: Phosphorus: 4.2 mg/dL (ref 2.5–4.6)

## 2021-11-05 NOTE — Progress Notes (Signed)
PROGRESS NOTE    [name]  ZYS:[id number] DOB: 1946/03/12 DOA: 11/01/2021 PCP: [place], [name], MD     Brief Narrative:  [name] is a 76 y.o.WF PMHx  COPD on home oxygen 2 L O2 via [place], Chronic Systolic CHF last EF measured in October 2022 was 51 to 60% with grade 2 diastolic dysfunction, peripheral vascular disease status post subclavian stenting and Hx  mesenteric ischemia, hypothyroidism HLD  Presents to the ER because of worsening shortness of breath.  Patient states she has been progressively getting short of breath for the last 3 weeks and has been having cough unable to bring her phlegm.  Denies any chest pain.   ED Course: In the ER patient has been found to be diffusely wheezing with chest x-ray showing possible infiltrates.  Patient is afebrile.  EKG shows nonspecific findings.  Patient was given nebulizer steroids and admitted for further work-up.   Subjective: 1/22 afebrile overnight A/O x4, patient states I am tired today.  Has been using incentive spirometer and flutter valve.  States has been getting thick sputum up much easier.    Assessment & Plan: Covid vaccination;   Principal Problem:   COPD exacerbation ([place]) Active Problems:   Hypothyroidism   Essential hypertension   Systolic CHF, chronic (HCC)   Chronic back pain   Systolic and diastolic CHF, chronic (HCC)   PVD (peripheral vascular disease) (HCC)   OSA on CPAP   Chronic diarrhea   GERD (gastroesophageal reflux disease)  Chronic back pain -1/20 gabapentin 300 mgQID (home dose) -1/20 morphine PRN back pain and SOB - 1/20 Norco 5/325 mg nightly (home dose) -1/20 Toradol IV 30 mg x 1  Acute on chronic respiratory failure with hypoxia/COPD exacerbation - Baseline O2 requirement 2 L O2 via [place] - Solu-Medrol 60 mg BID -DuoNeb QID -Flutter valve - Incentive spirometry - Mucinex BID -Continuous pulse ox -Titrate O2 to maintain SPO2> 92% -Complete 7-day course antibiotics  Chronic systolic  and diastolic CHF -Trend troponin.  Patient feels as if she is gaining weight and chest discomfort - Strict in and out -5.7 L - Daily weight Filed Weights   11/01/21 2229 11/04/21 0558  Weight: 61.2 kg 62.9 kg  - 1/20 US abdomen: Negative ascites  Hx PVD -S/p subclavian stenting and Hx of mesenteric ischemia presently on aspirin, Plavix, statins.  Hypothyroidism - Synthroid 112 mcg daily  HLD - Lipid panel pending  OSA - CPAP+ 2 L O2  Chronic diarrhea - 1/20 pancreatic elastase panel pending - 1/20 occult blood pending - 1/20 lactoferrin negative - 1/20 stool sodium, potassium pending - 1/20 stool fecal fat pending - 1/20 stool osmolality pending - 1/20 GI panel by PCR negative - 1/20 loperamide PRN  GERD - Protonix IV 40 mg BID - Carafate dispensing 1 g qac/qhs         DVT prophylaxis: Lovenox Code Status: DNR Family Communication:  Status is: Inpatient    Dispo: The patient is from: Home              Anticipated d/c is to: Home              Anticipated d/c date is: > 3 days              Patient currently is not medically stable to d/c.      Consultants:    Procedures/Significant Events:  1/20 US abdomen;-Negative ascites    I have personally reviewed and interpreted all radiology  studies and my findings are as above.  VENTILATOR SETTINGS: Nasal cannula 1/22 Flow 3 L/min SPO2 91%    Cultures 1/18 SARS coronavirus negative 1/18 influenza A/B negative 1/19 respiratory virus panel negative   Antimicrobials: Anti-infectives (From admission, onward)    Start     Ordered Stop   11/03/21 0600  azithromycin (ZITHROMAX) tablet 500 mg       See Hyperspace for full Linked Orders Report.   11/02/21 0148 11/07/21 0959   11/02/21 0400  cefTRIAXone (ROCEPHIN) 2 g in sodium chloride 0.9 % 100 mL IVPB        11/02/21 0312     11/02/21 0200  azithromycin (ZITHROMAX) 500 mg in sodium chloride 0.9 % 250 mL IVPB       See Hyperspace for full  Linked Orders Report.   11/02/21 0148 11/02/21 0440         Devices    LINES / TUBES:      Continuous Infusions:  cefTRIAXone (ROCEPHIN)  IV 2 g (11/05/21 0432)     Objective: Vitals:   11/05/21 0500 11/05/21 0600 11/05/21 0600 11/05/21 0739  BP:   (!) 141/74   Pulse:   70   Resp: 19 15 18    Temp:   98.1 F (36.7 C)   TempSrc:   Oral   SpO2:   90% 91%  Weight:      Height:        Intake/Output Summary (Last 24 hours) at 11/05/2021 1435 Last data filed at 11/05/2021 0900 Gross per 24 hour  Intake 240 ml  Output 1900 ml  Net -1660 ml    Filed Weights   11/01/21 2229 11/04/21 0558  Weight: 61.2 kg 62.9 kg    Examination: Physical Exam:  General: A/O x4, positive acute on chronic acute respiratory distress, cachectic, hard of hearing uses hearing aids Eyes: negative scleral hemorrhage, negative anisocoria, negative icterus ENT: Negative Runny nose, negative gingival bleeding, Neck:  Negative scars, masses, torticollis, lymphadenopathy, JVD Lungs: diffuse decreased breath sounds, inspiratory and expiratory diffuse wheezes (improved from 1/21), negative crackles Cardiovascular: Regular rhythm and rate without murmur gallop or rub normal S1 and S2 Abdomen: negative abdominal pain, positive distention, positive soft, bowel sounds, no rebound, no ascites, no appreciable mass Extremities: No significant cyanosis, clubbing, or edema bilateral lower extremities Skin: Negative rashes, lesions, ulcers Psychiatric:  Negative depression, negative anxiety, negative fatigue, negative mania  Central nervous system:  Cranial nerves II through XII intact, tongue/uvula midline, all extremities muscle strength 5/5, sensation intact throughout, negative dysarthria, negative expressive aphasia, negative receptive aphasia.    .     Data Reviewed: Care during the described time interval was provided by me .  I have reviewed this patient's available data, including medical  history, events of note, physical examination, and all test results as part of my evaluation.  CBC: Recent Labs  Lab 11/01/21 2259 11/02/21 0434 11/03/21 0501 11/04/21 0526 11/05/21 0544  WBC 6.7 4.2 7.8 6.6 5.9  NEUTROABS 5.4  --  6.1 4.8 4.5  HGB 12.5 12.6 12.4 13.1 14.2  HCT 38.5 38.3 38.2 40.6 44.4  MCV 93.0 91.8 91.6 92.5 93.3  PLT 359 378 370 407* 102    Basic Metabolic Panel: Recent Labs  Lab 11/01/21 2259 11/01/21 2303 11/02/21 0434 11/03/21 0501 11/04/21 0526 11/05/21 0544  NA 135  --  134* 136 136 137  K 3.2*  --  4.0 3.6 4.3 4.3  CL 97*  --  99 100 99  98  CO2 25  --  24 27 30  32  GLUCOSE 128*  --  183* 143* 93 103*  BUN 15  --  18 17 20 23   CREATININE 0.90 0.96 0.86 0.79 0.69 0.85  CALCIUM 8.5*  --  8.7* 8.4* 9.0 9.0  MG  --   --   --  2.4 2.3 2.4  PHOS  --   --   --  3.1 4.6 4.2    GFR: Estimated Creatinine Clearance: 52.7 mL/min (by C-G formula based on SCr of 0.85 mg/dL). Liver Function Tests: Recent Labs  Lab 11/03/21 0501 11/04/21 0526 11/05/21 0544  AST 19 16 16   ALT 21 20 20   ALKPHOS 68 72 73  BILITOT 0.2* 0.5 0.6  PROT 6.9 6.9 7.3  ALBUMIN 3.4* 3.6 3.9    No results for input(s): LIPASE, AMYLASE in the last 168 hours. No results for input(s): AMMONIA in the last 168 hours. Coagulation Profile: No results for input(s): INR, PROTIME in the last 168 hours. Cardiac Enzymes: No results for input(s): CKTOTAL, CKMB, CKMBINDEX, TROPONINI in the last 168 hours. BNP (last 3 results) No results for input(s): PROBNP in the last 8760 hours. HbA1C: No results for input(s): HGBA1C in the last 72 hours. CBG: No results for input(s): GLUCAP in the last 168 hours. Lipid Profile: No results for input(s): CHOL, HDL, LDLCALC, TRIG, CHOLHDL, LDLDIRECT in the last 72 hours. Thyroid Function Tests: No results for input(s): TSH, T4TOTAL, FREET4, T3FREE, THYROIDAB in the last 72 hours. Anemia Panel: No results for input(s): VITAMINB12, FOLATE,  FERRITIN, TIBC, IRON, RETICCTPCT in the last 72 hours. Sepsis Labs: No results for input(s): PROCALCITON, LATICACIDVEN in the last 168 hours.  Recent Results (from the past 240 hour(s))  Resp Panel by RT-PCR (Flu A&B, Covid) Nasopharyngeal Swab     Status: None   Collection Time: 11/01/21 10:33 PM   Specimen: Nasopharyngeal Swab; Nasopharyngeal(NP) swabs in vial transport medium  Result Value Ref Range Status   SARS Coronavirus 2 by RT PCR NEGATIVE NEGATIVE Final    Comment: (NOTE) SARS-CoV-2 target nucleic acids are NOT DETECTED.  The SARS-CoV-2 RNA is generally detectable in upper respiratory specimens during the acute phase of infection. The lowest concentration of SARS-CoV-2 viral copies this assay can detect is 138 copies/mL. A negative result does not preclude SARS-Cov-2 infection and should not be used as the sole basis for treatment or other patient management decisions. A negative result may occur with  improper specimen collection/handling, submission of specimen other than nasopharyngeal swab, presence of viral mutation(s) within the areas targeted by this assay, and inadequate number of viral copies(<138 copies/mL). A negative result must be combined with clinical observations, patient history, and epidemiological information. The expected result is Negative.  Fact Sheet for Patients:  EntrepreneurPulse.com.au  Fact Sheet for Healthcare Providers:  IncredibleEmployment.be  This test is no t yet approved or cleared by the [place] FDA and  has been authorized for detection and/or diagnosis of SARS-CoV-2 by FDA under an Emergency Use Authorization (EUA). This EUA will remain  in effect (meaning this test can be used) for the duration of the COVID-19 declaration under Section 564(b)(1) of the Act, 21 U.S.C.section 360bbb-3(b)(1), unless the authorization is terminated  or revoked sooner.       Influenza A by PCR NEGATIVE  NEGATIVE Final   Influenza B by PCR NEGATIVE NEGATIVE Final    Comment: (NOTE) The Xpert Xpress SARS-CoV-2/FLU/RSV plus assay is intended as an aid in the diagnosis  of influenza from Nasopharyngeal swab specimens and should not be used as a sole basis for treatment. Nasal washings and aspirates are unacceptable for Xpert Xpress SARS-CoV-2/FLU/RSV testing.  Fact Sheet for Patients: EntrepreneurPulse.com.au  Fact Sheet for Healthcare Providers: IncredibleEmployment.be  This test is not yet approved or cleared by the [place] FDA and has been authorized for detection and/or diagnosis of SARS-CoV-2 by FDA under an Emergency Use Authorization (EUA). This EUA will remain in effect (meaning this test can be used) for the duration of the COVID-19 declaration under Section 564(b)(1) of the Act, 21 U.S.C. section 360bbb-3(b)(1), unless the authorization is terminated or revoked.  Performed at [facility], [place] [address], [place], [place] [address]   Respiratory (~20 pathogens) panel by PCR     Status: None   Collection Time: 11/02/21  5:44 PM   Specimen: Nasopharyngeal Swab; Respiratory  Result Value Ref Range Status   Adenovirus NOT DETECTED NOT DETECTED Final   Coronavirus 229E NOT DETECTED NOT DETECTED Final    Comment: (NOTE) The Coronavirus on the Respiratory Panel, DOES NOT test for the novel  Coronavirus (2019 nCoV)    Coronavirus HKU1 NOT DETECTED NOT DETECTED Final   Coronavirus NL63 NOT DETECTED NOT DETECTED Final   Coronavirus OC43 NOT DETECTED NOT DETECTED Final   Metapneumovirus NOT DETECTED NOT DETECTED Final   Rhinovirus / Enterovirus NOT DETECTED NOT DETECTED Final   Influenza A NOT DETECTED NOT DETECTED Final   Influenza B NOT DETECTED NOT DETECTED Final   Parainfluenza Virus 1 NOT DETECTED NOT DETECTED Final   Parainfluenza Virus 2 NOT DETECTED NOT DETECTED Final   Parainfluenza Virus 3 NOT DETECTED NOT  DETECTED Final   Parainfluenza Virus 4 NOT DETECTED NOT DETECTED Final   Respiratory Syncytial Virus NOT DETECTED NOT DETECTED Final   Bordetella pertussis NOT DETECTED NOT DETECTED Final   Bordetella Parapertussis NOT DETECTED NOT DETECTED Final   Chlamydophila pneumoniae NOT DETECTED NOT DETECTED Final   Mycoplasma pneumoniae NOT DETECTED NOT DETECTED Final    Comment: Performed at [facility], [place]. [address] [place], [place] [address]  Gastrointestinal Panel by PCR , Stool     Status: None   Collection Time: 11/03/21  3:12 PM   Specimen: Stool  Result Value Ref Range Status   Campylobacter species NOT DETECTED NOT DETECTED Final   Plesimonas shigelloides NOT DETECTED NOT DETECTED Final   Salmonella species NOT DETECTED NOT DETECTED Final   Yersinia enterocolitica NOT DETECTED NOT DETECTED Final   Vibrio species NOT DETECTED NOT DETECTED Final   Vibrio cholerae NOT DETECTED NOT DETECTED Final   Enteroaggregative E coli (EAEC) NOT DETECTED NOT DETECTED Final   Enteropathogenic E coli (EPEC) NOT DETECTED NOT DETECTED Final   Enterotoxigenic E coli (ETEC) NOT DETECTED NOT DETECTED Final   Shiga like toxin producing E coli (STEC) NOT DETECTED NOT DETECTED Final   Shigella/Enteroinvasive E coli (EIEC) NOT DETECTED NOT DETECTED Final   Cryptosporidium NOT DETECTED NOT DETECTED Final   Cyclospora cayetanensis NOT DETECTED NOT DETECTED Final   Entamoeba histolytica NOT DETECTED NOT DETECTED Final   Giardia lamblia NOT DETECTED NOT DETECTED Final   Adenovirus F40/41 NOT DETECTED NOT DETECTED Final   Astrovirus NOT DETECTED NOT DETECTED Final   Norovirus GI/GII NOT DETECTED NOT DETECTED Final   Rotavirus A NOT DETECTED NOT DETECTED Final   Sapovirus (I, II, IV, and V) NOT DETECTED NOT DETECTED Final    Comment: Performed at [facility], [place]  [address] [place], [place] [address]          Radiology Studies: [place] ASCITES (ABDOMEN LIMITED)  Result Date:  11/03/2021 CLINICAL DATA:  Assess for ascites EXAM: LIMITED ABDOMEN ULTRASOUND FOR ASCITES TECHNIQUE: Limited ultrasound survey for ascites was performed in all four abdominal quadrants. COMPARISON:  CT 07/31/2021 FINDINGS: No significant ascites is seen. IMPRESSION: Negative.  No significant ascites. Electronically Signed   By: Donavan Foil M.D.   On: 11/03/2021 23:28        Scheduled Meds:  aspirin EC  81 mg Oral Daily   atorvastatin  80 mg Oral Daily   azithromycin  500 mg Oral Daily   budesonide (PULMICORT) nebulizer solution  0.5 mg Nebulization BID   cilostazol  100 mg Oral BID   clopidogrel  75 mg Oral Daily   dextromethorphan-guaiFENesin  1 tablet Oral BID   enoxaparin (LOVENOX) injection  40 mg Subcutaneous Q24H   ferrous sulfate  325 mg Oral Q breakfast   furosemide  40 mg Oral BID   ipratropium-albuterol  3 mL Nebulization TID   levothyroxine  112 mcg Oral Q0600   LORazepam  2 mg Oral QHS   methylPREDNISolone (SOLU-MEDROL) injection  60 mg Intravenous Q12H   montelukast  10 mg Oral QHS   pantoprazole (PROTONIX) IV  40 mg Intravenous Q12H   PARoxetine  20 mg Oral QHS   potassium chloride SA  20 mEq Oral TID   sucralfate  1 g Oral TID WC & HS   tamsulosin  0.4 mg Oral Daily   traZODone  100 mg Oral QHS   Continuous Infusions:  cefTRIAXone (ROCEPHIN)  IV 2 g (11/05/21 0432)     LOS: 3 days    Time spent:0 min    Shyheem Whitham, Geraldo Docker, MD [facility]   If 7PM-7AM, please contact night-coverage 11/05/2021, 2:35 PM

## 2021-11-06 LAB — COMPREHENSIVE METABOLIC PANEL
ALT: 20 U/L (ref 0–44)
AST: 16 U/L (ref 15–41)
Albumin: 3.6 g/dL (ref 3.5–5.0)
Alkaline Phosphatase: 70 U/L (ref 38–126)
Anion gap: 9 (ref 5–15)
BUN: 26 mg/dL — ABNORMAL HIGH (ref 8–23)
CO2: 29 mmol/L (ref 22–32)
Calcium: 8.6 mg/dL — ABNORMAL LOW (ref 8.9–10.3)
Chloride: 97 mmol/L — ABNORMAL LOW (ref 98–111)
Creatinine, Ser: 0.82 mg/dL (ref 0.44–1.00)
GFR, Estimated: >60 mL/min (ref >60)
Glucose, Bld: 121 mg/dL — ABNORMAL HIGH (ref 70–99)
Potassium: 3.8 mmol/L (ref 3.5–5.1)
Sodium: 135 mmol/L (ref 135–145)
Total Bilirubin: 0.6 mg/dL (ref 0.3–1.2)
Total Protein: 6.9 g/dL (ref 6.5–8.1)

## 2021-11-06 LAB — CBC WITH DIFFERENTIAL/PLATELET
Abs Immature Granulocytes: 0.03 10*3/uL (ref 0.00–0.07)
Basophils Absolute: 0 10*3/uL (ref 0.0–0.1)
Basophils Relative: 0 %
Eosinophils Absolute: 0 10*3/uL (ref 0.0–0.5)
Eosinophils Relative: 0 %
HCT: 42.2 % (ref 36.0–46.0)
Hemoglobin: 13.6 g/dL (ref 12.0–15.0)
Immature Granulocytes: 1 %
Lymphocytes Relative: 8 %
Lymphs Abs: 0.4 10*3/uL — ABNORMAL LOW (ref 0.7–4.0)
MCH: 29.6 pg (ref 26.0–34.0)
MCHC: 32.2 g/dL (ref 30.0–36.0)
MCV: 91.9 fL (ref 80.0–100.0)
Monocytes Absolute: 0.4 10*3/uL (ref 0.1–1.0)
Monocytes Relative: 8 %
Neutro Abs: 3.9 10*3/uL (ref 1.7–7.7)
Neutrophils Relative %: 83 %
Platelets: 437 10*3/uL — ABNORMAL HIGH (ref 150–400)
RBC: 4.59 MIL/uL (ref 3.87–5.11)
RDW: 16.3 % — ABNORMAL HIGH (ref 11.5–15.5)
WBC: 4.6 10*3/uL (ref 4.0–10.5)
nRBC: 0 % (ref 0.0–0.2)

## 2021-11-06 LAB — LIPID PANEL
Cholesterol: 220 mg/dL — ABNORMAL HIGH (ref 0–200)
HDL: 119 mg/dL (ref >40)
LDL Cholesterol: 91 mg/dL (ref 0–99)
Total CHOL/HDL Ratio: 1.8 RATIO
Triglycerides: 50 mg/dL (ref <150)
VLDL: 10 mg/dL (ref 0–40)

## 2021-11-06 LAB — PHOSPHORUS: Phosphorus: 3.9 mg/dL (ref 2.5–4.6)

## 2021-11-06 LAB — MAGNESIUM: Magnesium: 2.4 mg/dL (ref 1.7–2.4)

## 2021-11-06 LAB — OCCULT BLOOD X 1 CARD TO LAB, STOOL: Fecal Occult Bld: NEGATIVE

## 2021-11-06 NOTE — Care Management Important Message (Signed)
Important Message  Patient Details IM Letter given to the Patient. Name: [name] MRN: [id number] Date of Birth: 09/25/1946   Medicare Important Message Given:  Yes     Kerin Salen 11/06/2021, 10:13 AM

## 2021-11-06 NOTE — Progress Notes (Signed)
PROGRESS NOTE    [name]  DVV:[id number] DOB: Jan 31, 1946 DOA: 11/01/2021 PCP: Martinique, [name], MD     Brief Narrative:  [name] is a 76 y.o.WF PMHx  COPD on home oxygen 2 L O2 via [place], Chronic Systolic CHF last EF measured in October 2022 was 41 to 60% with grade 2 diastolic dysfunction, peripheral vascular disease status post subclavian stenting and Hx  mesenteric ischemia, hypothyroidism HLD  Presents to the ER because of worsening shortness of breath.  Patient states she has been progressively getting short of breath for the last 3 weeks and has been having cough unable to bring her phlegm.  Denies any chest pain.   ED Course: In the ER patient has been found to be diffusely wheezing with chest x-ray showing possible infiltrates.  Patient is afebrile.  EKG shows nonspecific findings.  Patient was given nebulizer steroids and admitted for further work-up.   Subjective: 1/23 afebrile overnight A/O x4,     Assessment & Plan: Covid vaccination;   Principal Problem:   COPD exacerbation ([place]) Active Problems:   Hypothyroidism   Essential hypertension   Systolic CHF, chronic (HCC)   Chronic back pain   Systolic and diastolic CHF, chronic (HCC)   PVD (peripheral vascular disease) (HCC)   OSA on CPAP   Chronic diarrhea   GERD (gastroesophageal reflux disease)  Chronic back pain -1/20 gabapentin 300 mgQID (home dose) -1/20 morphine PRN back pain and SOB - 1/20 Norco 5/325 mg nightly (home dose) -1/20 Toradol IV 30 mg x 1  Acute on chronic respiratory failure with hypoxia/COPD exacerbation - Baseline O2 requirement 2 L O2 via [place] - Solu-Medrol 60 mg BID -DuoNeb QID -Flutter valve - Incentive spirometry - Mucinex BID -Continuous pulse ox -Titrate O2 to maintain SPO2> 92% -Complete 7-day course antibiotics  Chronic systolic and diastolic CHF -Trend troponin.  Patient feels as if she is gaining weight and chest discomfort - Strict in and out -6.8 L - Daily  weight Filed Weights   11/01/21 2229 11/04/21 0558 11/06/21 0502  Weight: 61.2 kg 62.9 kg 64.1 kg  - 1/20 US abdomen: Negative ascites  Hx PVD -S/p subclavian stenting and Hx of mesenteric ischemia presently on aspirin, Plavix, statins.  Hypothyroidism - Synthroid 112 mcg daily  HLD - 1/23 LDL=91. Goal LDL<70 - Continue Lipitor 80mg  Qd - 1/23 Zetia 10mg  Qd  OSA - CPAP+ 2 L O2  Chronic diarrhea - 1/20 pancreatic elastase panel pending - 1/20 occult blood pending - 1/20 lactoferrin negative - 1/20 stool sodium, potassium pending - 1/20 stool fecal fat pending - 1/20 stool osmolality pending - 1/20 GI panel by PCR negative - 1/20 loperamide PRN  GERD - Protonix IV 40 mg BID - Carafate dispensing 1 g qac/qhs         DVT prophylaxis: Lovenox Code Status: DNR Family Communication:  Status is: Inpatient    Dispo: The patient is from: Home              Anticipated d/c is to: Home              Anticipated d/c date is: > 3 days              Patient currently is not medically stable to d/c.      Consultants:    Procedures/Significant Events:  1/20 US abdomen;-Negative ascites    I have personally reviewed and interpreted all radiology studies and my findings are as above.  VENTILATOR  SETTINGS: Nasal cannula 1/23 Flow 2 L/min SPO2 95%    Cultures 1/18 SARS coronavirus negative 1/18 influenza A/B negative 1/19 respiratory virus panel negative   Antimicrobials: Anti-infectives (From admission, onward)    Start     Ordered Stop   11/03/21 0600  azithromycin (ZITHROMAX) tablet 500 mg       See Hyperspace for full Linked Orders Report.   11/02/21 0148 11/07/21 0959   11/02/21 0400  cefTRIAXone (ROCEPHIN) 2 g in sodium chloride 0.9 % 100 mL IVPB        11/02/21 0312     11/02/21 0200  azithromycin (ZITHROMAX) 500 mg in sodium chloride 0.9 % 250 mL IVPB       See Hyperspace for full Linked Orders Report.   11/02/21 0148 11/02/21 0440          Devices    LINES / TUBES:      Continuous Infusions:  cefTRIAXone (ROCEPHIN)  IV 2 g (11/06/21 0439)     Objective: Vitals:   11/05/21 2200 11/05/21 2235 11/06/21 0502 11/06/21 0749  BP:  (!) 111/56 (!) 158/85   Pulse:  72 98   Resp: (!) 21 20 20    Temp:  (!) 97.5 F (36.4 C) (!) 97.5 F (36.4 C)   TempSrc:  Axillary Oral   SpO2:  93% 94% 95%  Weight:   64.1 kg   Height:        Intake/Output Summary (Last 24 hours) at 11/06/2021 1303 Last data filed at 11/06/2021 0502 Gross per 24 hour  Intake 720 ml  Output 2250 ml  Net -1530 ml    Filed Weights   11/01/21 2229 11/04/21 0558 11/06/21 0502  Weight: 61.2 kg 62.9 kg 64.1 kg   Examination: Physical Exam:  General: A/O x4, positive acute on chronic acute respiratory distress, cachectic, hard of hearing uses hearing aids Eyes: negative scleral hemorrhage, negative anisocoria, negative icterus ENT: Negative Runny nose, negative gingival bleeding, Neck:  Negative scars, masses, torticollis, lymphadenopathy, JVD Lungs: Clear to auscultation, inspiratory and expiratory diffuse wheezes (improved from 1/22), negative crackles Cardiovascular: Regular rhythm and rate without murmur gallop or rub normal S1 and S2 Abdomen: negative abdominal pain, positive distention, positive soft, bowel sounds, no rebound, no ascites, no appreciable mass Extremities: No significant cyanosis, clubbing, or edema bilateral lower extremities Skin: Negative rashes, lesions, ulcers Psychiatric:  Negative depression, negative anxiety, negative fatigue, negative mania  Central nervous system:  Cranial nerves II through XII intact, tongue/uvula midline, all extremities muscle strength 5/5, sensation intact throughout, negative dysarthria, negative expressive aphasia, negative receptive aphasia.    .     Data Reviewed: Care during the described time interval was provided by me .  I have reviewed this patient's available data, including  medical history, events of note, physical examination, and all test results as part of my evaluation.  CBC: Recent Labs  Lab 11/01/21 2259 11/02/21 0434 11/03/21 0501 11/04/21 0526 11/05/21 0544 11/06/21 0523  WBC 6.7 4.2 7.8 6.6 5.9 4.6  NEUTROABS 5.4  --  6.1 4.8 4.5 3.9  HGB 12.5 12.6 12.4 13.1 14.2 13.6  HCT 38.5 38.3 38.2 40.6 44.4 42.2  MCV 93.0 91.8 91.6 92.5 93.3 91.9  PLT 359 378 370 407* 372 437*    Basic Metabolic Panel: Recent Labs  Lab 11/02/21 0434 11/03/21 0501 11/04/21 0526 11/05/21 0544 11/06/21 0523  NA 134* 136 136 137 135  K 4.0 3.6 4.3 4.3 3.8  CL 99 100 99 98 97*  CO2 24 27 30  32 29  GLUCOSE 183* 143* 93 103* 121*  BUN 18 17 20 23  26*  CREATININE 0.86 0.79 0.69 0.85 0.82  CALCIUM 8.7* 8.4* 9.0 9.0 8.6*  MG  --  2.4 2.3 2.4 2.4  PHOS  --  3.1 4.6 4.2 3.9    GFR: Estimated Creatinine Clearance: 54.6 mL/min (by C-G formula based on SCr of 0.82 mg/dL). Liver Function Tests: Recent Labs  Lab 11/03/21 0501 11/04/21 0526 11/05/21 0544 11/06/21 0523  AST 19 16 16 16   ALT 21 20 20 20   ALKPHOS 68 72 73 70  BILITOT 0.2* 0.5 0.6 0.6  PROT 6.9 6.9 7.3 6.9  ALBUMIN 3.4* 3.6 3.9 3.6    No results for input(s): LIPASE, AMYLASE in the last 168 hours. No results for input(s): AMMONIA in the last 168 hours. Coagulation Profile: No results for input(s): INR, PROTIME in the last 168 hours. Cardiac Enzymes: No results for input(s): CKTOTAL, CKMB, CKMBINDEX, TROPONINI in the last 168 hours. BNP (last 3 results) No results for input(s): PROBNP in the last 8760 hours. HbA1C: No results for input(s): HGBA1C in the last 72 hours. CBG: No results for input(s): GLUCAP in the last 168 hours. Lipid Profile: Recent Labs    11/06/21 0523  CHOL 220*  HDL 119  LDLCALC 91  TRIG 50  CHOLHDL 1.8   Thyroid Function Tests: No results for input(s): TSH, T4TOTAL, FREET4, T3FREE, THYROIDAB in the last 72 hours. Anemia Panel: No results for input(s):  VITAMINB12, FOLATE, FERRITIN, TIBC, IRON, RETICCTPCT in the last 72 hours. Sepsis Labs: No results for input(s): PROCALCITON, LATICACIDVEN in the last 168 hours.  Recent Results (from the past 240 hour(s))  Resp Panel by RT-PCR (Flu A&B, Covid) Nasopharyngeal Swab     Status: None   Collection Time: 11/01/21 10:33 PM   Specimen: Nasopharyngeal Swab; Nasopharyngeal(NP) swabs in vial transport medium  Result Value Ref Range Status   SARS Coronavirus 2 by RT PCR NEGATIVE NEGATIVE Final    Comment: (NOTE) SARS-CoV-2 target nucleic acids are NOT DETECTED.  The SARS-CoV-2 RNA is generally detectable in upper respiratory specimens during the acute phase of infection. The lowest concentration of SARS-CoV-2 viral copies this assay can detect is 138 copies/mL. A negative result does not preclude SARS-Cov-2 infection and should not be used as the sole basis for treatment or other patient management decisions. A negative result may occur with  improper specimen collection/handling, submission of specimen other than nasopharyngeal swab, presence of viral mutation(s) within the areas targeted by this assay, and inadequate number of viral copies(<138 copies/mL). A negative result must be combined with clinical observations, patient history, and epidemiological information. The expected result is Negative.  Fact Sheet for Patients:  EntrepreneurPulse.com.au  Fact Sheet for Healthcare Providers:  IncredibleEmployment.be  This test is no t yet approved or cleared by the [place] FDA and  has been authorized for detection and/or diagnosis of SARS-CoV-2 by FDA under an Emergency Use Authorization (EUA). This EUA will remain  in effect (meaning this test can be used) for the duration of the COVID-19 declaration under Section 564(b)(1) of the Act, 21 U.S.C.section 360bbb-3(b)(1), unless the authorization is terminated  or revoked sooner.       Influenza A  by PCR NEGATIVE NEGATIVE Final   Influenza B by PCR NEGATIVE NEGATIVE Final    Comment: (NOTE) The Xpert Xpress SARS-CoV-2/FLU/RSV plus assay is intended as an aid in the diagnosis of influenza from Nasopharyngeal swab specimens and should  not be used as a sole basis for treatment. Nasal washings and aspirates are unacceptable for Xpert Xpress SARS-CoV-2/FLU/RSV testing.  Fact Sheet for Patients: EntrepreneurPulse.com.au  Fact Sheet for Healthcare Providers: IncredibleEmployment.be  This test is not yet approved or cleared by the [place] FDA and has been authorized for detection and/or diagnosis of SARS-CoV-2 by FDA under an Emergency Use Authorization (EUA). This EUA will remain in effect (meaning this test can be used) for the duration of the COVID-19 declaration under Section 564(b)(1) of the Act, 21 U.S.C. section 360bbb-3(b)(1), unless the authorization is terminated or revoked.  Performed at [facility], [place] [address], [place], [place] [address]   Respiratory (~20 pathogens) panel by PCR     Status: None   Collection Time: 11/02/21  5:44 PM   Specimen: Nasopharyngeal Swab; Respiratory  Result Value Ref Range Status   Adenovirus NOT DETECTED NOT DETECTED Final   Coronavirus 229E NOT DETECTED NOT DETECTED Final    Comment: (NOTE) The Coronavirus on the Respiratory Panel, DOES NOT test for the novel  Coronavirus (2019 nCoV)    Coronavirus HKU1 NOT DETECTED NOT DETECTED Final   Coronavirus NL63 NOT DETECTED NOT DETECTED Final   Coronavirus OC43 NOT DETECTED NOT DETECTED Final   Metapneumovirus NOT DETECTED NOT DETECTED Final   Rhinovirus / Enterovirus NOT DETECTED NOT DETECTED Final   Influenza A NOT DETECTED NOT DETECTED Final   Influenza B NOT DETECTED NOT DETECTED Final   Parainfluenza Virus 1 NOT DETECTED NOT DETECTED Final   Parainfluenza Virus 2 NOT DETECTED NOT DETECTED Final   Parainfluenza Virus 3  NOT DETECTED NOT DETECTED Final   Parainfluenza Virus 4 NOT DETECTED NOT DETECTED Final   Respiratory Syncytial Virus NOT DETECTED NOT DETECTED Final   Bordetella pertussis NOT DETECTED NOT DETECTED Final   Bordetella Parapertussis NOT DETECTED NOT DETECTED Final   Chlamydophila pneumoniae NOT DETECTED NOT DETECTED Final   Mycoplasma pneumoniae NOT DETECTED NOT DETECTED Final    Comment: Performed at [facility], [place]. [address], [place], [place] [address]  Gastrointestinal Panel by PCR , Stool     Status: None   Collection Time: 11/03/21  3:12 PM   Specimen: Stool  Result Value Ref Range Status   Campylobacter species NOT DETECTED NOT DETECTED Final   Plesimonas shigelloides NOT DETECTED NOT DETECTED Final   Salmonella species NOT DETECTED NOT DETECTED Final   Yersinia enterocolitica NOT DETECTED NOT DETECTED Final   Vibrio species NOT DETECTED NOT DETECTED Final   Vibrio cholerae NOT DETECTED NOT DETECTED Final   Enteroaggregative E coli (EAEC) NOT DETECTED NOT DETECTED Final   Enteropathogenic E coli (EPEC) NOT DETECTED NOT DETECTED Final   Enterotoxigenic E coli (ETEC) NOT DETECTED NOT DETECTED Final   Shiga like toxin producing E coli (STEC) NOT DETECTED NOT DETECTED Final   Shigella/Enteroinvasive E coli (EIEC) NOT DETECTED NOT DETECTED Final   Cryptosporidium NOT DETECTED NOT DETECTED Final   Cyclospora cayetanensis NOT DETECTED NOT DETECTED Final   Entamoeba histolytica NOT DETECTED NOT DETECTED Final   Giardia lamblia NOT DETECTED NOT DETECTED Final   Adenovirus F40/41 NOT DETECTED NOT DETECTED Final   Astrovirus NOT DETECTED NOT DETECTED Final   Norovirus GI/GII NOT DETECTED NOT DETECTED Final   Rotavirus A NOT DETECTED NOT DETECTED Final   Sapovirus (I, II, IV, and V) NOT DETECTED NOT DETECTED Final    Comment: Performed at [facility], [address] [place], [place] [address]  Radiology Studies: No results found.      Scheduled  Meds:  aspirin EC  81 mg Oral Daily   atorvastatin  80 mg Oral Daily   budesonide (PULMICORT) nebulizer solution  0.5 mg Nebulization BID   cilostazol  100 mg Oral BID   clopidogrel  75 mg Oral Daily   dextromethorphan-guaiFENesin  1 tablet Oral BID   enoxaparin (LOVENOX) injection  40 mg Subcutaneous Q24H   ferrous sulfate  325 mg Oral Q breakfast   furosemide  40 mg Oral BID   ipratropium-albuterol  3 mL Nebulization TID   levothyroxine  112 mcg Oral Q0600   LORazepam  2 mg Oral QHS   methylPREDNISolone (SOLU-MEDROL) injection  60 mg Intravenous Q12H   montelukast  10 mg Oral QHS   pantoprazole (PROTONIX) IV  40 mg Intravenous Q12H   PARoxetine  20 mg Oral QHS   potassium chloride SA  20 mEq Oral TID   sucralfate  1 g Oral TID WC & HS   tamsulosin  0.4 mg Oral Daily   traZODone  100 mg Oral QHS   Continuous Infusions:  cefTRIAXone (ROCEPHIN)  IV 2 g (11/06/21 0439)     LOS: 4 days    Time spent:0 min    Jackie Russman, Geraldo Docker, MD [facility]   If 7PM-7AM, please contact night-coverage 11/06/2021, 1:03 PM

## 2021-11-06 NOTE — Progress Notes (Signed)
PT demonstrated hands on understanding of Flutter device. PT has non productive cough at this time.

## 2021-11-07 LAB — CBC WITH DIFFERENTIAL/PLATELET
Abs Immature Granulocytes: 0.04 10*3/uL (ref 0.00–0.07)
Basophils Absolute: 0 10*3/uL (ref 0.0–0.1)
Basophils Relative: 0 %
Eosinophils Absolute: 0 10*3/uL (ref 0.0–0.5)
Eosinophils Relative: 0 %
HCT: 44.9 % (ref 36.0–46.0)
Hemoglobin: 14.8 g/dL (ref 12.0–15.0)
Immature Granulocytes: 1 %
Lymphocytes Relative: 13 %
Lymphs Abs: 0.9 10*3/uL (ref 0.7–4.0)
MCH: 30.3 pg (ref 26.0–34.0)
MCHC: 33 g/dL (ref 30.0–36.0)
MCV: 92 fL (ref 80.0–100.0)
Monocytes Absolute: 0.8 10*3/uL (ref 0.1–1.0)
Monocytes Relative: 12 %
Neutro Abs: 5 10*3/uL (ref 1.7–7.7)
Neutrophils Relative %: 74 %
Platelets: 399 10*3/uL (ref 150–400)
RBC: 4.88 MIL/uL (ref 3.87–5.11)
RDW: 16 % — ABNORMAL HIGH (ref 11.5–15.5)
WBC: 6.6 10*3/uL (ref 4.0–10.5)
nRBC: 0 % (ref 0.0–0.2)

## 2021-11-07 LAB — PHOSPHORUS: Phosphorus: 4 mg/dL (ref 2.5–4.6)

## 2021-11-07 LAB — COMPREHENSIVE METABOLIC PANEL
ALT: 21 U/L (ref 0–44)
AST: 15 U/L (ref 15–41)
Albumin: 3.6 g/dL (ref 3.5–5.0)
Alkaline Phosphatase: 66 U/L (ref 38–126)
Anion gap: 7 (ref 5–15)
BUN: 26 mg/dL — ABNORMAL HIGH (ref 8–23)
CO2: 30 mmol/L (ref 22–32)
Calcium: 9.2 mg/dL (ref 8.9–10.3)
Chloride: 97 mmol/L — ABNORMAL LOW (ref 98–111)
Creatinine, Ser: 0.74 mg/dL (ref 0.44–1.00)
GFR, Estimated: >60 mL/min (ref >60)
Glucose, Bld: 94 mg/dL (ref 70–99)
Potassium: 3.6 mmol/L (ref 3.5–5.1)
Sodium: 134 mmol/L — ABNORMAL LOW (ref 135–145)
Total Bilirubin: 0.6 mg/dL (ref 0.3–1.2)
Total Protein: 7 g/dL (ref 6.5–8.1)

## 2021-11-07 LAB — MAGNESIUM: Magnesium: 2.2 mg/dL (ref 1.7–2.4)

## 2021-11-07 MED ORDER — PANTOPRAZOLE SODIUM 40 MG PO TBEC: 40.0000 mg | DELAYED_RELEASE_TABLET | Freq: Every day | ORAL | 0 refills | Status: DC

## 2021-11-07 MED ORDER — PREDNISONE 10 MG PO TABS: ORAL_TABLET | ORAL | 0 refills | Status: DC

## 2021-11-07 MED ORDER — EZETIMIBE 10 MG PO TABS
10.0000 mg | ORAL_TABLET | Freq: Every day | ORAL | Status: DC
  Administered 2021-11-07: 10 mg via ORAL
  Filled 2021-11-07: qty 1

## 2021-11-07 MED ORDER — IPRATROPIUM-ALBUTEROL 0.5-2.5 (3) MG/3ML IN SOLN: 3.0000 mL | RESPIRATORY_TRACT | 4 refills | Status: DC

## 2021-11-07 MED ORDER — SODIUM CHLORIDE 0.9 % IV SOLN: 2.0000 g | INTRAVENOUS | Status: DC

## 2021-11-07 MED ORDER — DM-GUAIFENESIN ER 30-600 MG PO TB12
1.0000 | ORAL_TABLET | Freq: Two times a day (BID) | ORAL | 0 refills | Status: AC
Start: 2021-11-07 — End: 2021-11-21

## 2021-11-07 NOTE — Progress Notes (Addendum)
SATURATION QUALIFICATIONS: (This note is used to comply with regulatory documentation for home oxygen)  Patient Saturations on Room Air at Rest = %94  Patient Saturations on Room Air while Ambulating = 94%  Patient Saturations on 2 Liters of oxygen while Ambulating = 96%  Please briefly explain why patient needs home oxygen: Patient previously on 2L at rest on home 02 now currently at 2L @96 %

## 2021-11-07 NOTE — Discharge Summary (Incomplete)
0700-1600 Patient discharge instructions went over with patient and new medication list an upcoming appointments. Patient compliant with d/c and iv removed. Patients belongings with patient and patient wheeled down via wheelchair to front lobby.

## 2021-11-08 ENCOUNTER — Telehealth: Payer: Self-pay

## 2021-11-08 LAB — POTASSIUM, STOOL: Potassium, Stl: 38 mmol/L

## 2021-11-08 LAB — FECAL FAT, QUALITATIVE
Fat Qual Neutral, Stl: NORMAL
Fat Qual Total, Stl: NORMAL

## 2021-11-08 LAB — SODIUM, STOOL: Sodium, Stl: 82 mmol/L

## 2021-11-08 NOTE — Telephone Encounter (Signed)
Transition Care Management Follow-up Telephone Call Date of discharge and from where:TCM [place] 11-07-21 Dx:  COPD exac   How have you been since you were released from the hospital? Doing pretty good  Any questions or concerns? No  Items Reviewed: Did the pt receive and understand the discharge instructions provided? Yes  Medications obtained and verified? Yes  Other? No  Any new allergies since your discharge? No  Dietary orders reviewed? Yes Do you have support at home? Yes   Home Care and Equipment/Supplies: Were home health services ordered? no If so, what is the name of the agency? na  Has the agency set up a time to come to the patient's home? not applicable Were any new equipment or medical supplies ordered?  No What is the name of the medical supply agency? na Were you able to get the supplies/equipment? not applicable Do you have any questions related to the use of the equipment or supplies? No  Functional Questionnaire: (I = Independent and D = Dependent) ADLs: I  Bathing/Dressing- I  Meal Prep- I  Eating- I  Maintaining continence- I  Transferring/Ambulation- I  Managing Meds- I  Follow up appointments reviewed:  PCP Hospital f/u appt confirmed? Yes  Scheduled to see Dr Martinique on 11-14-21 @ [facility] f/u appt confirmed? Yes  Scheduled to see Dr Johney Frame on 11-14-21 @ 420pm. Are transportation arrangements needed? No  If their condition worsens, is the pt aware to call PCP or go to the Emergency Dept.? Yes Was the patient provided with contact information for the PCP's office or ED? Yes Was to pt encouraged to call back with questions or concerns? Yes

## 2021-11-11 NOTE — Progress Notes (Deleted)
Cardiology Office Note:    Date:  11/11/2021   ID:  [name], [name] 05/03/1946, MRN [id number]  PCP:  [place], [name], [place]  Cardiologist:  Fransico Him, MD  Advanced Practice Provider:  No care team member to display Electrophysiologist:  None    Referring MD: [place], [name], MD    History of Present Illness:    [name] is a 76 y.o. female with a hx of smoking, HTN, Gold stage III COPD, right breast cancer, hyperlipidemia, PAD, and mesenteric vascular disease who was previously followed by Truitt Merle who now returns to clinic for follow-up.  She had a mesenteric angiogram done in 5/12 by Dr. Bridgett Larsson, showing occluded celiac and SMA arteries with patent IMA.  She was planned for aorto-mesenteric bypass. She was referred for cardiology evaluation prior to surgery. However, it was ultimately decided not to treat her mesenteric disease surgically.    Given her exertional chest tightness and shortness of breath as well as known vascular disease, Dr. Aundra Dubin set her up for left heart cath.  This was done in 5/12 and showed a 70-80% ostial stenosis of a small to moderate 2nd diagonal.  This was too small to intervene on and not likely to be particularly symptomatic.  Lower extremity arterial dopplers showed a severe focal proximal right SFA stenosis with collaterals from the PFA. She had a VATS for a right-sided lung nodule that showed granulomatous inflammation (no cancer).  She quit smoking for a short time.  She saw Dr. Fletcher Anon for PAD evaluation, and it was decided to treat her medically.    Was hospitalized in 05/2020 with worsening DOE.  Echo showed reduced EF. Seen by cardiology and underwent coronary angiography which was negative for any obstructive disease. She was started on Paxil given significant depression.   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 Donzetta Matters.    Last seen in clinic on 12/2020 where she was doing well.  Today, ***       Past Medical History:  Diagnosis Date   Bilateral leg cramps    Bladder neoplasm    Cancer (HCC)    CHF (congestive heart failure) (HCC)    Chronic deep vein thrombosis (DVT) of right lower extremity ([place])    05/ 2018  chronic non-occlusive DVT RLE   COPD, frequent exacerbations ([place])    03-06-2018 per pt last exacerbation 12/ 2018   Coronary artery disease    cardiologist-  dr Aundra Dubin-- 03-11-2018 er cath , 80% stenosis in the ostial second diagonal   Dyspnea on minimal exertion    Emphysema/COPD ([place])    CAT score- 17   Family history of breast cancer    Family history of colon cancer    Family history of melanoma    Family history of ovarian cancer    Family history of pancreatic cancer    Fibromyalgia 1995   GERD (gastroesophageal reflux disease)    Hiatal hernia    History of breast cancer    History of diverticulitis of colon    2002  s/p  sigmoid colectomy   History of multiple pulmonary nodules    hx RUL nodules x2  s/p right VATS w/ wedge resection 09-11-2005 and 06-14-2011  both  necrotizing granulomatous inflammation w/ cystic area of necrosis & focal calcification   History of right breast cancer oncologist-  dr  magrinat-- no recurrence   dx 2010--  IDC, Stage IA , ER/PR+,  (pT1c pN0) 11-09-2008 right lumpectomy;  12-18-2008 right simple mastectomy for DCIS margins;  completed chemotherapy 2010 (no radiation) and completed antiestrogen therapy   Hx of colonic polyps    Dr. 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   Nocturia    OA (osteoarthritis)    thumbs   On supplemental oxygen therapy    03-06-2018 per pt uses only at night,  checks O2 sats at home,  stated am sat 89% after moving around average 93-94% with RA   Osteoporosis    Peripheral arterial occlusive disease ([place])  vascular-- dr chen/ dr Donzetta Matters   proximal right SFA severe focal stenosis with collaterals from the PFA/  04/ 2012 occluded celiac and SMA arteries with distal reconstitution w/ patent IMA   Peripheral vascular disease ([place])    chronic DVT RLE,  mesenteric vascular disease   Right lower lobe pulmonary nodule    Chest CT 08-29-2017   Stage 3 severe COPD by GOLD classification ([place]) hx frequent exacerbations--   pulmologist-  dr Maryann Alar--  per lov note , dated 12-20-2017, oxyogen 2L is prescribed for use with exertion (but pt only uses mostly at night), O2 sats on RA run the 80s , this day sat 86% RA and with 2L O2 sat 92%   Varicose veins of leg with swelling    varicose vein surgery - Dr. Aleda Grana   Wears glasses    Wears hearing aid in both ears     Past Surgical History:  Procedure Laterality Date   ABDOMINAL AORTOGRAM N/A 07/11/2020   Procedure: ABDOMINAL AORTOGRAM;  Surgeon: Waynetta Sandy, MD;  Location: [facility];  Service: Cardiovascular;  Laterality: N/A;   AORTIC ARCH ANGIOGRAPHY N/A 07/11/2020   Procedure: AORTIC ARCH ANGIOGRAPHY;  Surgeon: Waynetta Sandy, MD;  Location: [facility];  Service: Cardiovascular;  Laterality: N/A;   BREAST EXCISIONAL BIOPSY Left 10/15/2008   BREAST LUMPECTOMY W/ NEEDLE LOCALIZATION Right 11-09-2008    dr cornett   [place]  03-12-2011   dr Aundra Dubin   70-80% ostial stenosis in small second diagonal (appears to small for intervention),  dLAD 40-50%,  minimal luminal irregulartieis involoving LCFx and RCA,  LVEF 55%   CATARACT EXTRACTION W/ INTRAOCULAR LENS  IMPLANT, BILATERAL  2011   CYSTOSCOPY N/A 03/11/2018   Procedure: CYSTOSCOPY WITH INSTILLATION OF POST OPERATIVE EPIRUBICIN;  Surgeon: Festus Aloe, MD;  Location: [facility];  Service: Urology;  Laterality: N/A;   LEFT HEART CATH AND CORONARY ANGIOGRAPHY N/A 05/25/2020   Procedure: LEFT HEART CATH AND CORONARY ANGIOGRAPHY;  Surgeon: Troy Sine, MD;  Location: [facility];  Service: Cardiovascular;  Laterality: N/A;   MASTECTOMY Right    PARTIAL COLECTOMY  2002   sigmoid and appectomy (diverticulitis)   PARTIAL MASTECTOMY WITH NEEDLE LOCALIZATION Left 02/23/2013   Procedure:  LEFT PARTIAL MASTECTOMY WITH NEEDLE LOCALIZATION;  Surgeon: Adin Hector, MD;  Location: [facility];  Service: General;  Laterality: Left;   PERIPHERAL VASCULAR INTERVENTION  07/11/2020   Procedure: PERIPHERAL VASCULAR INTERVENTION;  Surgeon: Waynetta Sandy, MD;  Location: [facility];  Service: Cardiovascular;;   RECONSTRUCTION BREAST W/ LATISSIMUS DORSI FLAP Right 05-30-2009   dr Harlow Mares  [place]  MAMMAPLASTY Left    SIMPLE MASTECTOMY Right 12-28-2008    dr cornett  [facility]   TRANSTHORACIC ECHOCARDIOGRAM  07-09-2016  dr Aundra Dubin   ef 55-60%, grade 1 diastoic dysfunction/  mild TR   TRANSURETHRAL RESECTION OF BLADDER TUMOR N/A 03/11/2018   Procedure: TRANSURETHRAL RESECTION OF BLADDER TUMOR (TURBT) 2-5cm;  Surgeon: Festus Aloe, MD;  Location: [facility];  Service: Urology;  Laterality: N/A;   UPPER EXTREMITY ANGIOGRAPHY Left 07/11/2020   Procedure: Upper Extremity Angiography;  Surgeon: Waynetta Sandy, MD;  Location: [facility];  Service: Cardiovascular;  Laterality: Left;   VAGINAL HYSTERECTOMY  1973   VIDEO ASSISTED THORACOSCOPY (VATS)/WEDGE RESECTION Right 09-11-2005  &  06-14-2011   dr Fara Boros  [facility]   both RUL    Current Medications: No outpatient medications have been marked as taking for the 11/14/21 encounter (Appointment) with Freada Bergeron, MD.     Allergies:   Sinequan [doxepin], Sulfa antibiotics, and Sulfur   Social History   Socioeconomic History   Marital status: Married    Spouse name: Not on file   Number of children: 3   Years of education: 12   Highest education level: Not on file  Occupational History   Occupation: Chiropractor: RETIRED     Comment: retired  Tobacco Use   Smoking status: Former    Packs/day: 0.75    Years: 50.00    Pack years: 37.50    Types: Cigarettes    Quit date: 05/18/2016    Years since quitting: 5.4   Smokeless tobacco: Never  Vaping Use   Vaping Use: Never used  Substance and Sexual Activity   Alcohol use: Yes    Alcohol/week: 10.0 standard drinks    Types: 10 Cans of beer per week   Drug use: No   Sexual activity: Not on file  Other Topics Concern   Not on file  Social History Narrative   HSG, beauty school. Married '69 -10 yrs/widowed; married '79 - 82yr/divorced; married '87.  2 sons - '70, '74, 1 srtep-son; 1 grandchild. 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 SO - good health.  NO history of abuse.  ACP - full code and all heroic measures.    Social Determinants of Health   Financial Resource Strain: Low Risk    Difficulty of Paying Living Expenses: Not hard at all  Food Insecurity: No Food Insecurity   Worried About Charity fundraiser in the Last Year: Never true   [place] in the Last Year: Never true  Transportation Needs: No Transportation Needs   Lack of Transportation (Medical): No   Lack of Transportation (Non-Medical): No  Physical Activity: Inactive   Days of Exercise per Week: 0 days   Minutes of Exercise per Session: 0 min  Stress: No Stress Concern Present   Feeling of Stress : Not at all  Social Connections: Socially Isolated   Frequency of Communication with Friends and Family: Once a week   Frequency of Social Gatherings with Friends and Family: Never   Attends Religious Services: Never   Printmaker: No   Attends Music therapist: Never   Marital Status: Married     Family History: The patient's family history includes Breast cancer (age of onset: 34) in her niece; COPD in her mother; Cancer in her sister; Colon cancer in her mother; Coronary artery disease in her brother and father; Diabetes in her mother; Emphysema  in her mother; Heart attack in her brother and father; Heart disease in her father; Hypertension in her mother, sister, and sister; Melanoma in her niece and sister; Ovarian cancer in her sister; Pancreatic cancer in her sister; Sudden death in her father; Throat cancer in her sister.  ROS:   Please see the history of present illness.    Review of Systems  Constitutional:  Negative for chills and fever.  HENT:  Negative for hearing loss.   Eyes:  Negative for blurred vision and redness.  Respiratory:  Positive for shortness of breath.   Cardiovascular:  Negative for chest pain, palpitations, orthopnea, claudication, leg swelling and PND.  Gastrointestinal:  Negative for melena, nausea and vomiting.  Genitourinary:  Negative for dysuria and flank pain.  Musculoskeletal:  Positive for myalgias.  Skin:  Positive for itching and rash.  Neurological:  Negative for dizziness and loss of consciousness.  Endo/Heme/Allergies:  Negative for polydipsia.  Psychiatric/Behavioral:  Negative for substance abuse.    EKGs/Labs/Other Studies Reviewed:    The following studies were reviewed today: 07/11/2020 Pre-operative Diagnosis: Left subclavian artery steal Post-operative diagnosis:  Same Surgeon:  Erlene Quan C. Donzetta Matters, MD Procedure Performed: 1.  Ultrasound-guided cannulation right common femoral artery 2.  Arch aortogram 3.  Stent of left subclavian artery with 8 x 29 mm VBX 4.  Abdominal aortogram 5.  Moderate sedation with fentanyl Versed for 48 minutes   Indications: 76 year old female with known history of mesenteric artery stenoses.  This has never been intervened upon.  She more recently was found to have a subtotally occluded left subclavian artery with retrograde flow in her vertebral artery which was noted to be symptomatic.  She is now indicated for angiography possible invention.   Findings: Left subclavian artery was heavily calcified she had retrograde flow in her left vertebral artery.   She appears to have a bovine arch these arteries are all patent.  After intervention of the left subclavian artery there is antegrade flow in the left vertebral artery 0% residual stenosis were previously was occluded.   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  Bilateral renal arteries without stenosis.         LEFT HEART CATH AND CORONARY ANGIOGRAPHY 05/2020  Conclusion   No significant coronary obstructive disease.  The left main is short and immediately trifurcates into a large LAD, ramus immediate and left circumflex vessel.  The midportion of the LAD dips intramyocardially slightly but there is no evidence for systolic bridging.  The RCA is a dominant vessel that has calcification without obstructive stenosis.   Hyperdynamic LV function with EF estimated at 65%.  LVEDP 10 mmHg.   RECOMMENDATION: Medical therapy.     Echo Impression 05/21/20   1. Left ventricular ejection fraction, by estimation, is 45 to 50%. The  left ventricle has mildly decreased function. The left ventricle  demonstrates global hypokinesis. Indeterminate diastolic filling due to  E-A fusion. Definity contrast administered  but images remain suboptimal to exclude regional wall motion  abnormalities.   2. Right ventricular systolic function is normal. The right ventricular  size is normal. Tricuspid regurgitation signal is inadequate for assessing  PA pressure.   3. Left atrial size was mildly dilated.   4. The mitral valve is degenerative. Trivial mitral valve regurgitation.  Mild mitral stenosis. The mean mitral valve gradient is 3.0 mmHg with  average heart rate of 85 bpm.   5. The aortic valve is grossly normal. Aortic  valve regurgitation is not  visualized. No aortic stenosis is present.   6. The inferior vena cava is dilated in size with >50% respiratory  variability, suggesting right atrial pressure of 8 mmHg.     EKG:  EKG is  ordered today.  The ekg ordered today  demonstrates NSR with HR 73  Recent Labs: 07/19/2021: TSH 0.44 11/01/2021: B Natriuretic Peptide 123.9 11/07/2021: ALT 21; BUN 26; Creatinine, Ser 0.74; Hemoglobin 14.8; Magnesium 2.2; Platelets 399; Potassium 3.6; Sodium 134  Recent Lipid Panel    Component Value Date/Time   CHOL 220 (H) 11/06/2021 0523   CHOL 147 06/04/2018 1200   TRIG 50 11/06/2021 0523   TRIG 70 08/28/2006 0847   HDL 119 11/06/2021 0523   HDL 83 06/04/2018 1200   CHOLHDL 1.8 11/06/2021 0523   VLDL 10 11/06/2021 0523   LDLCALC 91 11/06/2021 0523   LDLCALC 49 06/04/2018 1200   LDLDIRECT 88.0 08/18/2013 1001     Physical Exam:    VS:  There were no vitals taken for this visit.    Wt Readings from Last 3 Encounters:  11/07/21 140 lb 14 oz (63.9 kg)  08/18/21 127 lb (57.6 kg)  08/08/21 128 lb 12 oz (58.4 kg)     GEN:  Thin, elderly female, comfortable HEENT: Normal NECK: No JVD; No carotid bruits CARDIAC: RRR, no murmurs, rubs, gallops RESPIRATORY: Diminished but clear with no wheezes ABDOMEN: Soft, non-tender, non-distended MUSCULOSKELETAL:  No edema; No deformity  SKIN: Diffuse erythematous raised rash on both upper and lower extremities NEUROLOGIC:  Alert and oriented x 3 PSYCHIATRIC:  Normal affect   ASSESSMENT:    No diagnosis found.  PLAN:    In order of problems listed above:  #Heart Failure with Mildly Reduced LVEF 45-50%: Recently diagnosed on admission for DOE in 05/2020. Cath negative for obstructive disease. Euvolemic. Unable to tolerate GDMT due to dizziness. Feels well with no anginal or HF symptoms -Continue low dose metoprolol  #Severe PAD: Followed by Dr. Donzetta Matters. Recent left subclavian stenting. -Continue ASA 81, plavix 75 -Continue atorvastatin 80mg  daily -Continue cilostizol -Follow-up with Dr. Donzetta Matters  #Severe COPD: Has had multiple hospitalizations for acute exacerbations. -Continue follow-up with pulm -Continue home inhalers  #HLD: -Lipitor 80mg  daily  #Prior  smoker: -Has abstained from smoking; doing well  #Mild carotid artery stenosis: 1-39% bilaterally on carotid in 06/2020. -Continue asa, plavix and statin      Medication Adjustments/Labs and Tests Ordered: Current medicines are reviewed at length with the patient today.  Concerns regarding medicines are outlined above.  No orders of the defined types were placed in this encounter.  No orders of the defined types were placed in this encounter.   There are no Patient Instructions on file for this visit.    Signed, Freada Bergeron, MD  11/11/2021 9:00 PM    [place]

## 2021-11-13 ENCOUNTER — Other Ambulatory Visit: Payer: Self-pay | Admitting: Family Medicine

## 2021-11-13 DIAGNOSIS — K219 Gastro-esophageal reflux disease without esophagitis: Secondary | ICD-10-CM

## 2021-11-13 NOTE — Progress Notes (Signed)
HPI: [name] is a 76 y.o. female with hx of HFrEF,COPD,HTN, hypothyroidism, anxiety,OSA on CPAP, PAD, chronic diarrhea, and GERD here today with her husband to follow on recent hospitalization. She was admitted on 11/01/21 and discharged home on 11/07/21. TCM call 11/08/21.  Discharged Dx COPD exacerbation. Cough and DOE back to her baseline.  She is on prednisone 10 mg 3 tabs daily and planning on continuing weaning off medication.  Cough,wheezing,and SOB exacerbated by exertion. She is using supplemental O2 at night, 2 LPM. Currently she is on DuoNeb treatments 3 times daily, Pulmicort neb treatments twice daily, and Trelegy Ellipta 200-62.5-25 mcg 1 puff daily. Mucinex DM 30-600 mg twice daily was added after hospitalization. Symptoms are not associated with CP, palpitation, or diaphoresis.  HFrEF and CAD: She is following with cardiologist today. She is on atorvastatin 80 mg daily.  Lab Results  Component Value Date   CHOL 220 (H) 11/06/2021   HDL 119 11/06/2021   LDLCALC 91 11/06/2021   LDLDIRECT 88.0 08/18/2013   TRIG 50 11/06/2021   CHOLHDL 1.8 11/06/2021    Negative for orthopnea,edema, and PND. She is on furosemide 40 mg twice daily. According to patient, she was advised to decrease fluid intake but she is concerned about dehydration. Concerned about renal function, few weeks ago she requested referral to nephrologist.  States that she was told in the hospital she has CKD, which caused emotional distress. Negative for form in urine, gross hematuria, or decreased urine output.  Lab Results  Component Value Date   CREATININE 0.74 11/07/2021   BUN 26 (H) 11/07/2021   NA 134 (L) 11/07/2021   K 3.6 11/07/2021   CL 97 (L) 11/07/2021   CO2 30 11/07/2021  Tongue burning and pain, which she attributes to prednisone. B12 deficiency, she is not on B12 supplementation. Lab Results  Component Value Date   VITAMINB12 227 08/02/2021   She is also reporting that  KLOR was discontinued.  Anxiety and insomnia: States that Paroxetine has been denied at her pharmacy, she has not taken med in months and she feels like she needs it. She is on trazodone 100 mg at bedtime and lorazepam 2 mg daily at bedtime. She was found paroxetine 20 mg daily, medication was added during one of her hospitalization a couple years ago. She has tolerated medications well, denies side effects.  GERD: Protonix was discontinued and she was started on Nexium. She is still having symptoms, heartburn, mainly at night. She feels like Protonix was helping more. Negative for abdominal pain, nausea, vomiting, or melena.  Review of Systems  Constitutional:  Positive for activity change, appetite change and fatigue. Negative for fever.  HENT:  Negative for sore throat and trouble swallowing.   Respiratory:  Positive for cough, shortness of breath and wheezing.   Gastrointestinal:  Negative for blood in stool and vomiting.       Negative for changes in bowel habits.  Genitourinary:  Negative for dysuria and flank pain.  Musculoskeletal:  Positive for arthralgias and gait problem.  Neurological:  Negative for syncope, weakness and headaches.  Psychiatric/Behavioral:  Positive for sleep disturbance. Negative for confusion. The patient is nervous/anxious.   Rest see pertinent positives and negatives per HPI.  Current Outpatient Medications on File Prior to Visit  Medication Sig Dispense Refill   albuterol (VENTOLIN HFA) 108 (90 Base) MCG/ACT inhaler TAKE 2 PUFFS BY MOUTH EVERY 6 HOURS AS NEEDED FOR WHEEZE OR SHORTNESS OF BREATH 8.5 each 3  aspirin EC 81 MG tablet Take 1 tablet (81 mg total) by mouth daily. 30 tablet    atorvastatin (LIPITOR) 80 MG tablet Take 1 tablet (80 mg total) by mouth daily. 90 tablet 2   budesonide (PULMICORT) 0.5 MG/2ML nebulizer solution Take 2 mLs (0.5 mg total) by nebulization 2 (two) times daily. 20 mL 12   cilostazol (PLETAL) 100 MG tablet TAKE 1 TABLET BY  MOUTH TWICE A DAY 180 tablet 3   clopidogrel (PLAVIX) 75 MG tablet TAKE 1 TABLET BY MOUTH EVERY DAY 90 tablet 3   dextromethorphan-guaiFENesin (MUCINEX DM) 30-600 MG 12hr tablet Take 1 tablet by mouth 2 (two) times daily for 14 days. 28 tablet 0   DUPIXENT 300 MG/2ML SOPN Inject 300 mg into the skin every 14 (fourteen) days. Every other Friday     ferrous sulfate 325 (65 FE) MG tablet TAKE 1 TABLET BY MOUTH EVERY DAY WITH BREAKFAST 90 tablet 1   fluticasone (FLONASE) 50 MCG/ACT nasal spray SPRAY 1 SPRAY INTO EACH NOSTRIL TWICE A DAY 48 mL 1   Fluticasone-Umeclidin-Vilant (TRELEGY ELLIPTA) 200-62.5-25 MCG/INH AEPB Inhale 1 puff into the lungs daily. 60 each 3   furosemide (LASIX) 40 MG tablet TAKE 1 TABLET BY MOUTH TWICE A DAY 180 tablet 1   gabapentin (NEURONTIN) 300 MG capsule Take 300 mg by mouth 4 (four) times daily as needed (for nerve pain).     HYDROcodone-acetaminophen (NORCO/VICODIN) 5-325 MG tablet Take 1 tablet by mouth at bedtime.     hydrOXYzine (ATARAX/VISTARIL) 25 MG tablet Take 25 mg by mouth daily as needed for itching.     ipratropium-albuterol (DUONEB) 0.5-2.5 (3) MG/3ML SOLN Inhale 3 mLs into the lungs as directed. TID for 1 week and then use as needed Q6 hours. 360 mL 4   levothyroxine (SYNTHROID) 112 MCG tablet TAKE 1 TABLET BY MOUTH EVERY DAY IN THE MORNING BEFORE BREAKFAST 90 tablet 2   LORazepam (ATIVAN) 2 MG tablet TAKE 1 TABLET BY MOUTH AT BEDTIME. 30 tablet 3   montelukast (SINGULAIR) 10 MG tablet TAKE 1 TABLET BY MOUTH EVERYDAY AT BEDTIME 90 tablet 1   pantoprazole (PROTONIX) 40 MG tablet Take 1 tablet (40 mg total) by mouth daily. 30 tablet 0   predniSONE (DELTASONE) 10 MG tablet Take 50mg  daily for 3days,Take 40mg  daily for 3days,Take 30mg  daily for 3days,Take 20mg  daily for 3days,Take 10mg  daily for 3days, then stop 45 tablet 0   tamsulosin (FLOMAX) 0.4 MG CAPS capsule TAKE 1 CAPSULE BY MOUTH EVERY DAY 90 capsule 0   traZODone (DESYREL) 100 MG tablet TAKE 1 TABLET BY  MOUTH EVERYDAY AT BEDTIME 90 tablet 1   ezetimibe (ZETIA) 10 MG tablet Take 1 tablet (10 mg total) by mouth daily. 90 tablet 3   No current facility-administered medications on file prior to visit.   Past Medical History:  Diagnosis Date   Bilateral leg cramps    Bladder neoplasm    Cancer (HCC)    CHF (congestive heart failure) (HCC)    Chronic deep vein thrombosis (DVT) of right lower extremity ([place])    05/ 2018  chronic non-occlusive DVT RLE   COPD, frequent exacerbations ([place])    03-06-2018 per pt last exacerbation 12/ 2018   Coronary artery disease    cardiologist-  dr Aundra Dubin-- 03-11-2018 er cath , 80% stenosis in the ostial second diagonal   Dyspnea on minimal exertion    Emphysema/COPD ([place])    CAT score- 17   Family history of breast cancer  Family history of colon cancer    Family history of melanoma    Family history of ovarian cancer    Family history of pancreatic cancer    Fibromyalgia 1995   GERD (gastroesophageal reflux disease)    Hiatal hernia    History of breast cancer    History of diverticulitis of colon    2002  s/p  sigmoid colectomy   History of multiple pulmonary nodules    hx RUL nodules x2  s/p right VATS w/ wedge resection 09-11-2005 and 06-14-2011  both  necrotizing granulomatous inflammation w/ cystic area of necrosis & focal calcification   History of right breast cancer oncologist-  dr Jana Hakim-- no recurrence   dx 2010--  IDC, Stage IA , ER/PR+,  (pT1c pN0) 11-09-2008 right lumpectomy;  12-18-2008 right simple mastectomy for DCIS margins;  completed chemotherapy 2010 (no radiation) and completed antiestrogen therapy   Hx of colonic polyps    Dr. 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   Nocturia    OA (osteoarthritis)    thumbs   On supplemental oxygen therapy    03-06-2018 per pt uses only at night,  checks O2 sats at home,   stated am sat 89% after moving around average 93-94% with RA   Osteoporosis    Peripheral arterial occlusive disease ([place]) vascular-- dr chen/ dr Donzetta Matters   proximal right SFA severe focal stenosis with collaterals from the PFA/  04/ 2012 occluded celiac and SMA arteries with distal reconstitution w/ patent IMA   Peripheral vascular disease ([place])    chronic DVT RLE,  mesenteric vascular disease   Right lower lobe pulmonary nodule    Chest CT 08-29-2017   Stage 3 severe COPD by GOLD classification ([place]) hx frequent exacerbations--   pulmologist-  dr Maryann Alar--  per lov note , dated 12-20-2017, oxyogen 2L is prescribed for use with exertion (but pt only uses mostly at night), O2 sats on RA run the 80s , this day sat 86% RA and with 2L O2 sat 92%   Varicose veins of leg with swelling    varicose vein surgery - Dr. Aleda Grana   Wears glasses    Wears hearing aid in both ears    Allergies  Allergen Reactions   Sinequan [Doxepin] Other (See Comments)    Kept her awake    Sulfa Antibiotics Swelling   Sulfur     Other reaction(s): Unknown    Social History   Socioeconomic History   Marital status: Married    Spouse name: Not on file   Number of children: 3   Years of education: 12   Highest education level: Not on file  Occupational History   Occupation: Chiropractor: RETIRED    Comment: retired  Tobacco Use   Smoking status: Former    Packs/day: 0.75    Years: 50.00    Pack years: 37.50    Types: Cigarettes    Quit date: 05/18/2016    Years since quitting: 5.4   Smokeless tobacco: Never  Vaping Use   Vaping Use: Never used  Substance and Sexual Activity   Alcohol use: Yes    Alcohol/week: 10.0 standard drinks    Types: 10 Cans of beer per week   Drug use: No   Sexual activity: Not on file  Other Topics Concern   Not on  file  Social History Narrative   HSG, beauty school. Married '69 -2 yrs/widowed; married '79 - 32yr/divorced; married '87.  2 sons - '70, '74, 1  srtep-son; 1 grandchild. 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 SO - good health.  NO history of abuse.  ACP - full code and all heroic measures.    Social Determinants of Health   Financial Resource Strain: Low Risk    Difficulty of Paying Living Expenses: Not hard at all  Food Insecurity: No Food Insecurity   Worried About Charity fundraiser in the Last Year: Never true   [place] in the Last Year: Never true  Transportation Needs: No Transportation Needs   Lack of Transportation (Medical): No   Lack of Transportation (Non-Medical): No  Physical Activity: Inactive   Days of Exercise per Week: 0 days   Minutes of Exercise per Session: 0 min  Stress: No Stress Concern Present   Feeling of Stress : Not at all  Social Connections: Socially Isolated   Frequency of Communication with Friends and Family: Once a week   Frequency of Social Gatherings with Friends and Family: Never   Attends Religious Services: Never   Marine scientist or Organizations: No   Attends Archivist Meetings: Never   Marital Status: Married   Vitals:   11/14/21 1349  BP: 124/70  Pulse: 100  Resp: 16  SpO2: 91%   Body mass index is 21.67 kg/m.  Physical Exam Vitals and nursing note reviewed.  Constitutional:      General: She is not in acute distress.    Appearance: She is well-developed.  HENT:     Head: Normocephalic and atraumatic.     Mouth/Throat:     Mouth: Mucous membranes are moist.   Eyes:     Conjunctiva/sclera: Conjunctivae normal.  Cardiovascular:     Rate and Rhythm: Normal rate and regular rhythm.     Heart sounds: No murmur heard. Pulmonary:     Effort: Pulmonary effort is normal. No respiratory distress.     Breath sounds: Examination of the right-upper field reveals wheezing. Wheezing (Sporadic, right upper field) present.  Abdominal:     Palpations: Abdomen is soft. There is no hepatomegaly or mass.     Tenderness: There is no abdominal tenderness.   Lymphadenopathy:     Cervical: No cervical adenopathy.  Skin:    General: Skin is warm.     Findings: No erythema or rash.  Neurological:     General: No focal deficit present.     Mental Status: She is alert and oriented to person, place, and time.     Gait: Gait normal.     Comments: Unstable gait, not assisted.  Psychiatric:     Comments: Well groomed, good eye contact.   ASSESSMENT AND PLAN:  Ms.Camdynn was seen today for hospitalization follow-up.  Diagnoses and all orders for this visit: Orders Placed This Encounter  Procedures   Basic metabolic panel   Vitamin O84   Lab Results  Component Value Date   CREATININE 1.13 11/14/2021   BUN 31 (H) 11/14/2021   NA 134 (L) 11/14/2021   K 3.0 (L) 11/14/2021   CL 93 (L) 11/14/2021   CO2 29 11/14/2021   Lab Results  Component Value Date   VITAMINB12 191 (L) 11/14/2021   COPD, frequent exacerbations ([place]) Symptoms have improved and she is closer to her baseline. Respiratory tract infections seem to be the common precipitating factor.  No changes in Duoneb,Trelegy Ellipta,or Pulmicort dose. We discussed some side effects of medications. Continue tapering prednisone dose. In the past she has follow-up with pulmonologist, according to pt, there were not other treatment options and she was instructed to continue following with PCP. She is not interested in new referral.  Chronic respiratory failure with hypoxia ([place]) She can use O2 supplementation during the day as needed with exertion 2 LPM to keep O2 sat >=89%.  Thrush, oral Mild. Continue swishing after use of Pulmicort and Trelegy Ellipta. Nystatin solution recommended.  -     nystatin (MYCOSTATIN) 100000 UNIT/ML suspension; Take 5 mLs (500,000 Units total) by mouth 4 (four) times daily.  Gastroesophageal reflux disease, unspecified whether esophagitis present She feels like Protonix has helped the most, so stop Nexium and resume Protonix 40 mg daily. Continue GERD  precautions.  B12 deficiency Some of tongue symptoms could be caused by this problem. Further recommendation will be given according to B12 result.  Anxiety disorder, unspecified type We discussed some side effects of paroxetine as well as the risk of med interaction with trazodone. She feels the medication was really helping, she will resume paroxetine 20 mg daily. Continue lorazepam 2 mg daily at bedtime.  -     PARoxetine (PAXIL) 20 MG tablet; Take 1 tablet (20 mg total) by mouth daily.  HFrEF (heart failure with reduced ejection fraction) (HCC) We discussed some side effects of diuretics, continue furosemide 40 mg twice daily. She has appointment with cardiologist today. Continue low salt diet and fluid restriction (as instructed at hospital discharge).  Atherosclerosis of native coronary artery of native heart with angina pectoris ([place]) LDL was 49 on 11/06/21. On atorvastatin 80 mg daily, Aspirin 81 mg daily, and Plavix 75 mg daily. Following with cardiologist.  Return in about 3 months (around 02/11/2022).  Jadden Yim G. [place], MD  [facility]. [place] office.

## 2021-11-13 NOTE — Discharge Summary (Signed)
Physician Discharge Summary   Patient: [name] MRN: [id number] DOB: [birth year]  Admit date:     11/01/2021  Discharge date: 11/07/2021  Discharge Physician: [name]   PCP: [place], [name], MD   Recommendations at discharge:   Follows up with PCP in 1 week.  Discharge Diagnoses Principal Problem:   COPD exacerbation ([place]) Active Problems:   Hypothyroidism   Essential hypertension   Systolic CHF, chronic (HCC)   Chronic back pain   Systolic and diastolic CHF, chronic (HCC)   PVD (peripheral vascular disease) (HCC)   OSA on CPAP   Chronic diarrhea   GERD (gastroesophageal reflux disease)  Resolved Problems:   * No resolved hospital problems. [facility]   Acute COPD exacerbation secondary to bronchitis. Acute on chronic hypoxic respiratory failure. Patient was treated with IV steroid Medrol and duo nebs. Oxygenation improving on both rest as well as on ambulation. Will continue nebulizer therapy on discharge.  Continue prednisone taper.  No indication for antibiotics for now.  Chronic systolic and diastolic CHF. Volume status adequate. Monitor pretension continue home regimen.  History of PVD. No acute symptoms. Continue aspirin Plavix.  Hyperlipidemia. Continue statin.  Hypothyroidism. Continue Synthroid.  OSA. Continue CPAP nightly.  Chronic back pain. Continue home regimen of pain control.  Chronic diarrhea pretension continue home regimen.  GERD. Continue PPI.     Consultants: none Procedures performed: none  Disposition: Home Diet recommendation: Cardiac diet  DISCHARGE MEDICATION: Allergies as of 11/07/2021       Reactions   Sinequan [doxepin] Other (See Comments)   Kept her awake    Sulfa Antibiotics Swelling   Sulfur    Other reaction(s): Unknown        Medication List     STOP taking these medications    esomeprazole 40 MG capsule Commonly known as: NexIUM   guaiFENesin 600 MG 12 hr tablet Commonly  known as: MUCINEX   Klor-Con M20 20 MEQ tablet Generic drug: potassium chloride SA       TAKE these medications    albuterol 108 (90 Base) MCG/ACT inhaler Commonly known as: VENTOLIN HFA TAKE 2 PUFFS BY MOUTH EVERY 6 HOURS AS NEEDED FOR WHEEZE OR SHORTNESS OF BREATH What changed: See the new instructions.   aspirin EC 81 MG tablet Take 1 tablet (81 mg total) by mouth daily.   atorvastatin 80 MG tablet Commonly known as: LIPITOR Take 1 tablet (80 mg total) by mouth daily.   budesonide 0.5 MG/2ML nebulizer solution Commonly known as: PULMICORT Take 2 mLs (0.5 mg total) by nebulization 2 (two) times daily.   cilostazol 100 MG tablet Commonly known as: PLETAL TAKE 1 TABLET BY MOUTH TWICE A DAY   clopidogrel 75 MG tablet Commonly known as: PLAVIX TAKE 1 TABLET BY MOUTH EVERY DAY   dextromethorphan-guaiFENesin 30-600 MG 12hr tablet Commonly known as: MUCINEX DM Take 1 tablet by mouth 2 (two) times daily for 14 days.   Dupixent 300 MG/2ML Sopn Generic drug: Dupilumab Inject 300 mg into the skin every 14 (fourteen) days. Every other Friday   ferrous sulfate 325 (65 FE) MG tablet TAKE 1 TABLET BY MOUTH EVERY DAY WITH BREAKFAST What changed: See the new instructions.   fluticasone 50 MCG/ACT nasal spray Commonly known as: FLONASE SPRAY 1 SPRAY INTO EACH NOSTRIL TWICE A DAY What changed: See the new instructions.   furosemide 40 MG tablet Commonly known as: LASIX TAKE 1 TABLET BY MOUTH TWICE A DAY What changed: when  to take this   gabapentin 300 MG capsule Commonly known as: NEURONTIN Take 300 mg by mouth 4 (four) times daily as needed (for nerve pain).   HYDROcodone-acetaminophen 5-325 MG tablet Commonly known as: NORCO/VICODIN Take 1 tablet by mouth at bedtime.   hydrOXYzine 25 MG tablet Commonly known as: ATARAX Take 25 mg by mouth daily as needed for itching.   ipratropium-albuterol 0.5-2.5 (3) MG/3ML Soln Commonly known as: DUONEB Inhale 3 mLs into the  lungs as directed. TID for 1 week and then use as needed Q6 hours. What changed:  when to take this reasons to take this additional instructions   levothyroxine 112 MCG tablet Commonly known as: SYNTHROID TAKE 1 TABLET BY MOUTH EVERY DAY IN THE MORNING BEFORE BREAKFAST What changed: See the new instructions.   LORazepam 2 MG tablet Commonly known as: ATIVAN TAKE 1 TABLET BY MOUTH AT BEDTIME.   montelukast 10 MG tablet Commonly known as: SINGULAIR TAKE 1 TABLET BY MOUTH EVERYDAY AT BEDTIME What changed: See the new instructions.   pantoprazole 40 MG tablet Commonly known as: Protonix Take 1 tablet (40 mg total) by mouth daily.   PARoxetine 20 MG tablet Commonly known as: PAXIL TAKE 1 TABLET BY MOUTH EVERYDAY AT BEDTIME What changed: See the new instructions.   predniSONE 10 MG tablet Commonly known as: DELTASONE Take 50mg  daily for 3days,Take 40mg  daily for 3days,Take 30mg  daily for 3days,Take 20mg  daily for 3days,Take 10mg  daily for 3days, then stop   tamsulosin 0.4 MG Caps capsule Commonly known as: FLOMAX TAKE 1 CAPSULE BY MOUTH EVERY DAY   traZODone 100 MG tablet Commonly known as: DESYREL TAKE 1 TABLET BY MOUTH EVERYDAY AT BEDTIME What changed: See the new instructions.   Trelegy Ellipta 200-62.5-25 MCG/ACT Aepb Generic drug: Fluticasone-Umeclidin-Vilant Inhale 1 puff into the lungs daily.        Follow-up Information     [place], [name], MD. Schedule an appointment as soon as possible for a visit in 1 week(s).   Specialty: Family Medicine Contact information: [place] [address] [phone]                 Discharge Exam: [name]   11/04/21 0558 11/06/21 0502 11/07/21 0455  Weight: 62.9 kg 64.1 kg 63.9 kg   General: Appear in mild distress, no Rash; Oral Mucosa clear. no Abnormal Neck Mass Or lumps, Conjunctiva normal  Cardiovascular: S1 and S2 Present, no Murmur Respiratory: Good respiratory effort,  Bilateral Air entry present and no Crackles, bilateral expiratory wheezes Abdomen: Bowel Sound present, Soft and no tenderness Extremities: trace Pedal edema Neurology: alert and oriented to time, place, and person affect appropriate. no new focal deficit Gait not checked due to patient safety concerns     Condition at discharge: good  The results of significant diagnostics from this hospitalization (including imaging, microbiology, ancillary and laboratory) are listed below for reference.   Imaging Studies: DG Chest Port 1 View  Result Date: 11/01/2021 CLINICAL DATA:  Increasing shortness of breath for 3 weeks. EXAM: PORTABLE CHEST 1 VIEW COMPARISON:  08/18/2021. FINDINGS: The heart size and mediastinal contours are within normal limits. There is atherosclerotic calcification of the aorta. Surgical changes are present in the mid to upper right lung field. Mild interstitial prominence and a few airspace opacities are noted at the lung bases bilaterally. There is mild blunting of the right costophrenic angle. No pneumothorax. No acute osseous abnormality. IMPRESSION: 1. Interstitial prominence with mild airspace disease at the  lung bases, which may be infectious or inflammatory. 2. Blunting of the right costophrenic angle, may represent atelectasis or scarring. A small pleural effusion can not be excluded. Electronically Signed   By: Brett Fairy M.D.   On: 11/01/2021 22:48   [place] ASCITES (ABDOMEN LIMITED)  Result Date: 11/03/2021 CLINICAL DATA:  Assess for ascites EXAM: LIMITED ABDOMEN ULTRASOUND FOR ASCITES TECHNIQUE: Limited ultrasound survey for ascites was performed in all four abdominal quadrants. COMPARISON:  CT 07/31/2021 FINDINGS: No significant ascites is seen. IMPRESSION: Negative.  No significant ascites. Electronically Signed   By: Donavan Foil M.D.   On: 11/03/2021 23:28    Microbiology: Results for orders placed or performed during the hospital encounter of 11/01/21  Resp Panel by  RT-PCR (Flu A&B, Covid) Nasopharyngeal Swab     Status: None   Collection Time: 11/01/21 10:33 PM   Specimen: Nasopharyngeal Swab; Nasopharyngeal(NP) swabs in vial transport medium  Result Value Ref Range Status   SARS Coronavirus 2 by RT PCR NEGATIVE NEGATIVE Final    Comment: (NOTE) SARS-CoV-2 target nucleic acids are NOT DETECTED.  The SARS-CoV-2 RNA is generally detectable in upper respiratory specimens during the acute phase of infection. The lowest concentration of SARS-CoV-2 viral copies this assay can detect is 138 copies/mL. A negative result does not preclude SARS-Cov-2 infection and should not be used as the sole basis for treatment or other patient management decisions. A negative result may occur with  improper specimen collection/handling, submission of specimen other than nasopharyngeal swab, presence of viral mutation(s) within the areas targeted by this assay, and inadequate number of viral copies(<138 copies/mL). A negative result must be combined with clinical observations, patient history, and epidemiological information. The expected result is Negative.  Fact Sheet for Patients:  EntrepreneurPulse.com.au  Fact Sheet for Healthcare Providers:  IncredibleEmployment.be  This test is no t yet approved or cleared by the [place] FDA and  has been authorized for detection and/or diagnosis of SARS-CoV-2 by FDA under an Emergency Use Authorization (EUA). This EUA will remain  in effect (meaning this test can be used) for the duration of the COVID-19 declaration under Section 564(b)(1) of the Act, 21 U.S.C.section 360bbb-3(b)(1), unless the authorization is terminated  or revoked sooner.       Influenza A by PCR NEGATIVE NEGATIVE Final   Influenza B by PCR NEGATIVE NEGATIVE Final    Comment: (NOTE) The Xpert Xpress SARS-CoV-2/FLU/RSV plus assay is intended as an aid in the diagnosis of influenza from Nasopharyngeal swab  specimens and should not be used as a sole basis for treatment. Nasal washings and aspirates are unacceptable for Xpert Xpress SARS-CoV-2/FLU/RSV testing.  Fact Sheet for Patients: EntrepreneurPulse.com.au  Fact Sheet for Healthcare Providers: IncredibleEmployment.be  This test is not yet approved or cleared by the [place] FDA and has been authorized for detection and/or diagnosis of SARS-CoV-2 by FDA under an Emergency Use Authorization (EUA). This EUA will remain in effect (meaning this test can be used) for the duration of the COVID-19 declaration under Section 564(b)(1) of the Act, 21 U.S.C. section 360bbb-3(b)(1), unless the authorization is terminated or revoked.  Performed at [facility], [place] [address] [place], [place] [address]   Respiratory (~20 pathogens) panel by PCR     Status: None   Collection Time: 11/02/21  5:44 PM   Specimen: Nasopharyngeal Swab; Respiratory  Result Value Ref Range Status   Adenovirus NOT DETECTED NOT DETECTED Final   Coronavirus 229E NOT DETECTED NOT DETECTED Final  Comment: (NOTE) The Coronavirus on the Respiratory Panel, DOES NOT test for the novel  Coronavirus (2019 nCoV)    Coronavirus HKU1 NOT DETECTED NOT DETECTED Final   Coronavirus NL63 NOT DETECTED NOT DETECTED Final   Coronavirus OC43 NOT DETECTED NOT DETECTED Final   Metapneumovirus NOT DETECTED NOT DETECTED Final   Rhinovirus / Enterovirus NOT DETECTED NOT DETECTED Final   Influenza A NOT DETECTED NOT DETECTED Final   Influenza B NOT DETECTED NOT DETECTED Final   Parainfluenza Virus 1 NOT DETECTED NOT DETECTED Final   Parainfluenza Virus 2 NOT DETECTED NOT DETECTED Final   Parainfluenza Virus 3 NOT DETECTED NOT DETECTED Final   Parainfluenza Virus 4 NOT DETECTED NOT DETECTED Final   Respiratory Syncytial Virus NOT DETECTED NOT DETECTED Final   Bordetella pertussis NOT DETECTED NOT DETECTED Final   Bordetella  Parapertussis NOT DETECTED NOT DETECTED Final   Chlamydophila pneumoniae NOT DETECTED NOT DETECTED Final   Mycoplasma pneumoniae NOT DETECTED NOT DETECTED Final    Comment: Performed at [facility], [place] [address] [place], [place] [address]  Gastrointestinal Panel by PCR , Stool     Status: None   Collection Time: 11/03/21  3:12 PM   Specimen: Stool  Result Value Ref Range Status   Campylobacter species NOT DETECTED NOT DETECTED Final   Plesimonas shigelloides NOT DETECTED NOT DETECTED Final   Salmonella species NOT DETECTED NOT DETECTED Final   Yersinia enterocolitica NOT DETECTED NOT DETECTED Final   Vibrio species NOT DETECTED NOT DETECTED Final   Vibrio cholerae NOT DETECTED NOT DETECTED Final   Enteroaggregative E coli (EAEC) NOT DETECTED NOT DETECTED Final   Enteropathogenic E coli (EPEC) NOT DETECTED NOT DETECTED Final   Enterotoxigenic E coli (ETEC) NOT DETECTED NOT DETECTED Final   Shiga like toxin producing E coli (STEC) NOT DETECTED NOT DETECTED Final   Shigella/Enteroinvasive E coli (EIEC) NOT DETECTED NOT DETECTED Final   Cryptosporidium NOT DETECTED NOT DETECTED Final   Cyclospora cayetanensis NOT DETECTED NOT DETECTED Final   Entamoeba histolytica NOT DETECTED NOT DETECTED Final   Giardia lamblia NOT DETECTED NOT DETECTED Final   Adenovirus F40/41 NOT DETECTED NOT DETECTED Final   Astrovirus NOT DETECTED NOT DETECTED Final   Norovirus GI/GII NOT DETECTED NOT DETECTED Final   Rotavirus A NOT DETECTED NOT DETECTED Final   Sapovirus (I, II, IV, and V) NOT DETECTED NOT DETECTED Final    Comment: Performed at [facility], [place] [place],  [address]    Labs: CBC: Recent Labs  Lab 11/07/21 0534  WBC 6.6  NEUTROABS 5.0  HGB 14.8  HCT 44.9  MCV 92.0  PLT 076   Basic Metabolic Panel: Recent Labs  Lab 11/07/21 0534  NA 134*  K 3.6  CL 97*  CO2 30  GLUCOSE 94  BUN 26*  CREATININE 0.74  CALCIUM 9.2  MG 2.2  PHOS 4.0    Liver Function Tests: Recent Labs  Lab 11/07/21 0534  AST 15  ALT 21  ALKPHOS 66  BILITOT 0.6  PROT 7.0  ALBUMIN 3.6   CBG: No results for input(s): GLUCAP in the last 168 hours.  Discharge time spent: greater than 30 minutes.  Signed: Berle Mull, MD [facility]

## 2021-11-14 ENCOUNTER — Ambulatory Visit (INDEPENDENT_AMBULATORY_CARE_PROVIDER_SITE_OTHER): Payer: Medicare Other | Admitting: Cardiology

## 2021-11-14 ENCOUNTER — Encounter: Payer: Self-pay | Admitting: Cardiology

## 2021-11-14 ENCOUNTER — Encounter: Payer: Self-pay | Admitting: Family Medicine

## 2021-11-14 ENCOUNTER — Ambulatory Visit (INDEPENDENT_AMBULATORY_CARE_PROVIDER_SITE_OTHER): Payer: Medicare Other | Admitting: Family Medicine

## 2021-11-14 ENCOUNTER — Other Ambulatory Visit: Payer: Self-pay

## 2021-11-14 VITALS — BP 126/78 | HR 62 | Ht 66.0 in | Wt 135.2 lb

## 2021-11-14 VITALS — BP 124/70 | HR 100 | Resp 16 | Ht 66.0 in | Wt 134.2 lb

## 2021-11-14 DIAGNOSIS — I5022 Chronic systolic (congestive) heart failure: Secondary | ICD-10-CM

## 2021-11-14 DIAGNOSIS — I10 Essential (primary) hypertension: Secondary | ICD-10-CM

## 2021-11-14 DIAGNOSIS — E538 Deficiency of other specified B group vitamins: Secondary | ICD-10-CM

## 2021-11-14 DIAGNOSIS — I739 Peripheral vascular disease, unspecified: Secondary | ICD-10-CM

## 2021-11-14 DIAGNOSIS — K219 Gastro-esophageal reflux disease without esophagitis: Secondary | ICD-10-CM | POA: Diagnosis not present

## 2021-11-14 DIAGNOSIS — E876 Hypokalemia: Secondary | ICD-10-CM

## 2021-11-14 DIAGNOSIS — J9611 Chronic respiratory failure with hypoxia: Secondary | ICD-10-CM

## 2021-11-14 DIAGNOSIS — E785 Hyperlipidemia, unspecified: Secondary | ICD-10-CM

## 2021-11-14 DIAGNOSIS — I25119 Atherosclerotic heart disease of native coronary artery with unspecified angina pectoris: Secondary | ICD-10-CM

## 2021-11-14 DIAGNOSIS — I5020 Unspecified systolic (congestive) heart failure: Secondary | ICD-10-CM

## 2021-11-14 DIAGNOSIS — Z79899 Other long term (current) drug therapy: Secondary | ICD-10-CM | POA: Diagnosis not present

## 2021-11-14 DIAGNOSIS — J441 Chronic obstructive pulmonary disease with (acute) exacerbation: Secondary | ICD-10-CM | POA: Diagnosis not present

## 2021-11-14 DIAGNOSIS — I6523 Occlusion and stenosis of bilateral carotid arteries: Secondary | ICD-10-CM

## 2021-11-14 DIAGNOSIS — J449 Chronic obstructive pulmonary disease, unspecified: Secondary | ICD-10-CM

## 2021-11-14 DIAGNOSIS — I2510 Atherosclerotic heart disease of native coronary artery without angina pectoris: Secondary | ICD-10-CM | POA: Diagnosis not present

## 2021-11-14 DIAGNOSIS — B370 Candidal stomatitis: Secondary | ICD-10-CM

## 2021-11-14 DIAGNOSIS — F419 Anxiety disorder, unspecified: Secondary | ICD-10-CM

## 2021-11-14 LAB — BASIC METABOLIC PANEL
BUN: 31 mg/dL — ABNORMAL HIGH (ref 6–23)
CO2: 29 mEq/L (ref 19–32)
Calcium: 12.5 mg/dL — ABNORMAL HIGH (ref 8.4–10.5)
Chloride: 93 mEq/L — ABNORMAL LOW (ref 96–112)
Creatinine, Ser: 1.13 mg/dL (ref 0.40–1.20)
GFR: 47.1 mL/min — ABNORMAL LOW (ref >60.00)
Glucose, Bld: 128 mg/dL — ABNORMAL HIGH (ref 70–99)
Potassium: 3 mEq/L — ABNORMAL LOW (ref 3.5–5.1)
Sodium: 134 mEq/L — ABNORMAL LOW (ref 135–145)

## 2021-11-14 LAB — VITAMIN B12: Vitamin B-12: 191 pg/mL — ABNORMAL LOW (ref 211–911)

## 2021-11-14 MED ORDER — PAROXETINE HCL 20 MG PO TABS: 20.0000 mg | ORAL_TABLET | Freq: Every day | ORAL | 2 refills | Status: DC

## 2021-11-14 MED ORDER — EZETIMIBE 10 MG PO TABS: 10.0000 mg | ORAL_TABLET | Freq: Every day | ORAL | 3 refills | Status: DC

## 2021-11-14 MED ORDER — NYSTATIN 100000 UNIT/ML MT SUSP: 5.0000 mL | Freq: Four times a day (QID) | OROMUCOSAL | 0 refills | Status: DC

## 2021-11-14 NOTE — Patient Instructions (Signed)
Medication Instructions:   START TAKING ZETIA 10 MG BY MOUTH DAILY  *If you need a refill on your cardiac medications before your next appointment, please call your pharmacy*   Lab Work:  IN 6-8 WEEKS HERE IN THE OFFICE TO CHECK--LIPIDS--PLEASE COME FASTING TO THIS LAB APPOINTMENT  If you have labs (blood work) drawn today and your tests are completely normal, you will receive your results only by: [place] (if you have MyChart) OR A paper copy in the mail If you have any lab test that is abnormal or we need to change your treatment, we will call you to review the results.   Follow-Up: At [facility], you and your health needs are our priority.  As part of our continuing mission to provide you with exceptional heart care, we have created designated Provider Care Teams.  These Care Teams include your primary Cardiologist (physician) and Advanced Practice Providers (APPs -  Physician Assistants and Nurse Practitioners) who all work together to provide you with the care you need, when you need it.  We recommend signing up for the patient portal called "MyChart".  Sign up information is provided on this After Visit Summary.  MyChart is used to connect with patients for Virtual Visits (Telemedicine).  Patients are able to view lab/test results, encounter notes, upcoming appointments, etc.  Non-urgent messages can be sent to your provider as well.   To learn more about what you can do with MyChart, go to [email or web address].    Your next appointment:   6 month(s)  The format for your next appointment:   In Person  Provider:   DR. Johney Frame

## 2021-11-14 NOTE — Progress Notes (Signed)
Cardiology Office Note:    Date:  11/14/2021   ID:  [name], [name] 03/17/1946, MRN [id number]  PCP:  [place], [name], [place]  Cardiologist:  Fransico Him, MD  Advanced Practice Provider:  No care team member to display Electrophysiologist:  None    Referring MD: [place], [name], MD    History of Present Illness:    [name] is a 76 y.o. female with a hx of smoking, HTN, Gold stage III COPD, right breast cancer, hyperlipidemia, PAD, and mesenteric vascular disease who was previously followed by Truitt Merle who now returns to clinic for follow-up.  She had a mesenteric angiogram done in 5/12 by Dr. Bridgett Larsson, showing occluded celiac and SMA arteries with patent IMA.  She was planned for aorto-mesenteric bypass. She was referred for cardiology evaluation prior to surgery. However, it was ultimately decided not to treat her mesenteric disease surgically.    Given her exertional chest tightness and shortness of breath as well as known vascular disease, Dr. Aundra Dubin set her up for left heart cath.  This was done in 5/12 and showed a 70-80% ostial stenosis of a small to moderate 2nd diagonal.  This was too small to intervene on and not likely to be particularly symptomatic.  Lower extremity arterial dopplers showed a severe focal proximal right SFA stenosis with collaterals from the PFA. She had a VATS for a right-sided lung nodule that showed granulomatous inflammation (no cancer).  She quit smoking for a short time.  She saw Dr. Fletcher Anon for PAD evaluation, and it was decided to treat her medically.    Was hospitalized in 05/2020 with worsening DOE.  Echo showed reduced EF. Seen by cardiology and underwent coronary angiography which was negative for any obstructive disease. She was started on Paxil given significant depression.   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 Donzetta Matters.    Last seen in clinic on 12/2020 where she was doing well.  Today, she is not doing well. She was in the hospital last week for COPD exacerbation that was triggered by a URI. She was given IV steroids and ABX and later transitioned to a PO regimen. She feels poorly on the prednisone but is currently taking 3 tablets. The prednisone prevents her from sleeping. However, her breathing has improved.   She has been monitoring her weights daily and they have been stable with no weight gain/edema on the prednisone. BP are controlled as well. Has been compliant with all medications.  The patient denies chest pain, chest pressure, palpitations, PND, or orthopnea. Denies cough, fever, chills. Denies nausea, vomiting. Denies syncope or presyncope. Denies dizziness or lightheadedness. Denies snoring.  Past Medical History:  Diagnosis Date   Bilateral leg cramps    Bladder neoplasm    Cancer (HCC)    CHF (congestive heart failure) (HCC)    Chronic deep vein thrombosis (DVT) of right lower extremity ([place])    05/ 2018  chronic non-occlusive DVT RLE   COPD, frequent exacerbations ([place])    03-06-2018 per pt last exacerbation 12/ 2018   Coronary artery disease    cardiologist-  dr Aundra Dubin-- 03-11-2018 er cath , 80% stenosis in the ostial second diagonal   Dyspnea on minimal exertion    Emphysema/COPD ([place])    CAT score- 17   Family history of breast cancer    Family history of  colon cancer    Family history of melanoma    Family history of ovarian cancer    Family history of pancreatic cancer    Fibromyalgia 1995   GERD (gastroesophageal reflux disease)    Hiatal hernia    History of breast cancer    History of diverticulitis of colon    2002  s/p  sigmoid colectomy   History of multiple pulmonary nodules    hx RUL nodules x2  s/p right VATS w/ wedge resection 09-11-2005 and 06-14-2011  both  necrotizing granulomatous inflammation w/ cystic area of necrosis & focal calcification   History of right  breast cancer oncologist-  dr Jana Hakim-- no recurrence   dx 2010--  IDC, Stage IA , ER/PR+,  (pT1c pN0) 11-09-2008 right lumpectomy;  12-18-2008 right simple mastectomy for DCIS margins;  completed chemotherapy 2010 (no radiation) and completed antiestrogen therapy   Hx of colonic polyps    Dr. 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   Nocturia    OA (osteoarthritis)    thumbs   On supplemental oxygen therapy    03-06-2018 per pt uses only at night,  checks O2 sats at home,  stated am sat 89% after moving around average 93-94% with RA   Osteoporosis    Peripheral arterial occlusive disease ([place]) vascular-- dr chen/ dr Donzetta Matters   proximal right SFA severe focal stenosis with collaterals from the PFA/  04/ 2012 occluded celiac and SMA arteries with distal reconstitution w/ patent IMA   Peripheral vascular disease ([place])    chronic DVT RLE,  mesenteric vascular disease   Right lower lobe pulmonary nodule    Chest CT 08-29-2017   Stage 3 severe COPD by GOLD classification ([place]) hx frequent exacerbations--   pulmologist-  dr Maryann Alar--  per lov note , dated 12-20-2017, oxyogen 2L is prescribed for use with exertion (but pt only uses mostly at night), O2 sats on RA run the 80s , this day sat 86% RA and with 2L O2 sat 92%   Varicose veins of leg with swelling    varicose vein surgery - Dr. Aleda Grana   Wears glasses    Wears hearing aid in both ears     Past Surgical History:  Procedure Laterality Date   ABDOMINAL AORTOGRAM N/A 07/11/2020   Procedure: ABDOMINAL AORTOGRAM;  Surgeon: Waynetta Sandy, MD;  Location: [facility];  Service: Cardiovascular;  Laterality: N/A;   AORTIC ARCH ANGIOGRAPHY N/A 07/11/2020   Procedure: AORTIC ARCH ANGIOGRAPHY;  Surgeon: Waynetta Sandy, MD;  Location: [facility];  Service: Cardiovascular;  Laterality: N/A;   BREAST  EXCISIONAL BIOPSY Left 10/15/2008   BREAST LUMPECTOMY W/ NEEDLE LOCALIZATION Right 11-09-2008    dr cornett   [place]  03-12-2011   dr Aundra Dubin   70-80% ostial stenosis in small second diagonal (appears to small for intervention),  dLAD 40-50%,  minimal luminal irregulartieis involoving LCFx and RCA,  LVEF 55%   CATARACT EXTRACTION W/ INTRAOCULAR LENS  IMPLANT, BILATERAL  2011   CYSTOSCOPY N/A 03/11/2018   Procedure: CYSTOSCOPY WITH INSTILLATION OF POST OPERATIVE EPIRUBICIN;  Surgeon: Festus Aloe, MD;  Location: [facility];  Service: Urology;  Laterality: N/A;   LEFT HEART CATH AND CORONARY ANGIOGRAPHY N/A 05/25/2020   Procedure: LEFT HEART CATH AND CORONARY ANGIOGRAPHY;  Surgeon: Troy Sine, MD;  Location: [facility];  Service: Cardiovascular;  Laterality: N/A;   MASTECTOMY Right    PARTIAL COLECTOMY  2002   sigmoid and appectomy (diverticulitis)   PARTIAL MASTECTOMY WITH NEEDLE LOCALIZATION Left 02/23/2013   Procedure:  LEFT PARTIAL MASTECTOMY WITH NEEDLE LOCALIZATION;  Surgeon: Adin Hector, MD;  Location: [facility];  Service: General;  Laterality: Left;   PERIPHERAL VASCULAR INTERVENTION  07/11/2020   Procedure: PERIPHERAL VASCULAR INTERVENTION;  Surgeon: Waynetta Sandy, MD;  Location: [facility];  Service: Cardiovascular;;   RECONSTRUCTION BREAST W/ LATISSIMUS DORSI FLAP Right 05-30-2009   dr Harlow Mares  [facility]   REDUCTION MAMMAPLASTY Left    SIMPLE MASTECTOMY Right 12-28-2008    dr cornett  [facility]   TRANSTHORACIC ECHOCARDIOGRAM  07-09-2016  dr Aundra Dubin   ef 55-60%, grade 1 diastoic dysfunction/  mild TR   TRANSURETHRAL RESECTION OF BLADDER TUMOR N/A 03/11/2018   Procedure: TRANSURETHRAL RESECTION OF BLADDER TUMOR (TURBT) 2-5cm;  Surgeon: Festus Aloe, MD;  Location: [facility];  Service: Urology;  Laterality: N/A;   UPPER EXTREMITY ANGIOGRAPHY Left 07/11/2020   Procedure: Upper Extremity Angiography;  Surgeon: Waynetta Sandy, MD;  Location: [facility];  Service: Cardiovascular;  Laterality: Left;   VAGINAL HYSTERECTOMY  1973   VIDEO ASSISTED THORACOSCOPY (VATS)/WEDGE RESECTION Right 09-11-2005  &  06-14-2011   dr Fara Boros  [facility]   both RUL    Current Medications: Current Meds  Medication Sig   albuterol (VENTOLIN HFA) 108 (90 Base) MCG/ACT inhaler TAKE 2 PUFFS BY MOUTH EVERY 6 HOURS AS NEEDED FOR WHEEZE OR SHORTNESS OF BREATH   aspirin EC 81 MG tablet Take 1 tablet (81 mg total) by mouth daily.   atorvastatin (LIPITOR) 80 MG tablet Take 1 tablet (80 mg total) by mouth daily.   budesonide (PULMICORT) 0.5 MG/2ML nebulizer solution Take 2 mLs (0.5 mg total) by nebulization 2 (two) times daily.   cilostazol (PLETAL) 100 MG tablet TAKE 1 TABLET BY MOUTH TWICE A DAY   clopidogrel (PLAVIX) 75 MG tablet TAKE 1 TABLET BY MOUTH EVERY DAY   dextromethorphan-guaiFENesin (MUCINEX DM) 30-600 MG 12hr tablet Take 1 tablet by mouth 2 (two) times daily for 14 days.   DUPIXENT 300 MG/2ML SOPN Inject 300 mg into the skin every 14 (fourteen) days. Every other Friday   ezetimibe (ZETIA) 10 MG tablet Take 1 tablet (10 mg total) by mouth daily.   ferrous sulfate 325 (65 FE) MG tablet TAKE 1 TABLET BY MOUTH EVERY DAY WITH BREAKFAST   fluticasone (FLONASE) 50 MCG/ACT nasal spray SPRAY 1 SPRAY INTO EACH NOSTRIL TWICE A DAY   Fluticasone-Umeclidin-Vilant (TRELEGY ELLIPTA) 200-62.5-25 MCG/INH AEPB Inhale 1 puff into the lungs daily.   furosemide (LASIX) 40 MG tablet TAKE 1 TABLET BY MOUTH TWICE A DAY   gabapentin (NEURONTIN) 300 MG capsule Take 300 mg by mouth 4 (four) times daily as needed (for nerve pain).   HYDROcodone-acetaminophen (NORCO/VICODIN) 5-325 MG tablet Take 1 tablet by mouth at bedtime.   hydrOXYzine (ATARAX/VISTARIL) 25 MG tablet Take 25 mg by mouth daily as needed for itching.   ipratropium-albuterol (DUONEB) 0.5-2.5 (3) MG/3ML SOLN Inhale 3 mLs into the lungs as directed. TID for 1 week and then use as  needed Q6 hours.   levothyroxine (SYNTHROID) 112 MCG tablet TAKE 1 TABLET BY MOUTH EVERY DAY IN THE MORNING BEFORE BREAKFAST   LORazepam (ATIVAN) 2 MG tablet TAKE 1 TABLET BY MOUTH AT BEDTIME.   montelukast (SINGULAIR) 10 MG  tablet TAKE 1 TABLET BY MOUTH EVERYDAY AT BEDTIME   nystatin (MYCOSTATIN) 100000 UNIT/ML suspension Take 5 mLs (500,000 Units total) by mouth 4 (four) times daily.   pantoprazole (PROTONIX) 40 MG tablet Take 1 tablet (40 mg total) by mouth daily.   PARoxetine (PAXIL) 20 MG tablet Take 1 tablet (20 mg total) by mouth daily.   predniSONE (DELTASONE) 10 MG tablet Take 50mg  daily for 3days,Take 40mg  daily for 3days,Take 30mg  daily for 3days,Take 20mg  daily for 3days,Take 10mg  daily for 3days, then stop   tamsulosin (FLOMAX) 0.4 MG CAPS capsule TAKE 1 CAPSULE BY MOUTH EVERY DAY   traZODone (DESYREL) 100 MG tablet TAKE 1 TABLET BY MOUTH EVERYDAY AT BEDTIME     Allergies:   Sinequan [doxepin], Sulfa antibiotics, and Sulfur   Social History   Socioeconomic History   Marital status: Married    Spouse name: Not on file   Number of children: 3   Years of education: 12   Highest education level: Not on file  Occupational History   Occupation: Chiropractor: RETIRED    Comment: retired  Tobacco Use   Smoking status: Former    Packs/day: 0.75    Years: 50.00    Pack years: 37.50    Types: Cigarettes    Quit date: 05/18/2016    Years since quitting: 5.4   Smokeless tobacco: Never  Vaping Use   Vaping Use: Never used  Substance and Sexual Activity   Alcohol use: Yes    Alcohol/week: 10.0 standard drinks    Types: 10 Cans of beer per week   Drug use: No   Sexual activity: Not on file  Other Topics Concern   Not on file  Social History Narrative   HSG, beauty school. Married '69 -60 yrs/widowed; married '79 - 27yr/divorced; married '87.  2 sons - '70, '74, 1 srtep-son; 1 grandchild. 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 SO - good health.  NO history of abuse.  ACP -  full code and all heroic measures.    Social Determinants of Health   Financial Resource Strain: Low Risk    Difficulty of Paying Living Expenses: Not hard at all  Food Insecurity: No Food Insecurity   Worried About Charity fundraiser in the Last Year: Never true   [place] in the Last Year: Never true  Transportation Needs: No Transportation Needs   Lack of Transportation (Medical): No   Lack of Transportation (Non-Medical): No  Physical Activity: Inactive   Days of Exercise per Week: 0 days   Minutes of Exercise per Session: 0 min  Stress: No Stress Concern Present   Feeling of Stress : Not at all  Social Connections: Socially Isolated   Frequency of Communication with Friends and Family: Once a week   Frequency of Social Gatherings with Friends and Family: Never   Attends Religious Services: Never   Printmaker: No   Attends Music therapist: Never   Marital Status: Married     Family History: The patient's family history includes Breast cancer (age of onset: 71) in her niece; COPD in her mother; Cancer in her sister; Colon cancer in her mother; Coronary artery disease in her brother and father; Diabetes in her mother; Emphysema in her mother; Heart attack in her brother and father; Heart disease in her father; Hypertension in her mother, sister, and sister; Melanoma in her niece and sister; Ovarian cancer in her sister; Pancreatic  cancer in her sister; Sudden death in her father; Throat cancer in her sister.  ROS:   Please see the history of present illness.    Review of Systems  Constitutional:  Negative for chills and fever.  HENT:  Negative for hearing loss.   Eyes:  Negative for blurred vision and redness.  Respiratory:  Negative for shortness of breath.   Cardiovascular:  Negative for chest pain, palpitations, orthopnea, claudication, leg swelling and PND.  Gastrointestinal:  Positive for abdominal pain (swelling).  Negative for melena, nausea and vomiting.  Genitourinary:  Negative for dysuria and flank pain.  Musculoskeletal:  Negative for myalgias.  Skin:  Negative for itching and rash.  Neurological:  Negative for dizziness and loss of consciousness.  Endo/Heme/Allergies:  Negative for polydipsia.  Psychiatric/Behavioral:  Negative for substance abuse. The patient has insomnia.    EKGs/Labs/Other Studies Reviewed:    The following studies were reviewed today: Echo 08/02/21 1. Left ventricular ejection fraction, by estimation, is 55 to 60%. The  left ventricle has normal function. The left ventricle has no regional  wall motion abnormalities. Left ventricular diastolic parameters are  consistent with Grade II diastolic  dysfunction (pseudonormalization).   2. Right ventricular systolic function is normal. The right ventricular  size is normal. Tricuspid regurgitation signal is inadequate for assessing  PA pressure.   3. The mitral valve is normal in structure. No evidence of mitral valve  regurgitation. No evidence of mitral stenosis.   4. The aortic valve is normal in structure. Aortic valve regurgitation is  not visualized. No aortic stenosis is present.   5. The inferior vena cava is dilated in size with >50% respiratory  variability, suggesting right atrial pressure of 8 mmHg.   Comparison(s): Compared to prior echo in 05/21/2020, there has been  improvement in the Left ventricular ejection fraction was 45-50% now  55-60%.   Aortic Arch Angiography 07/11/2020 Pre-operative Diagnosis: Left subclavian artery steal Post-operative diagnosis:  Same Surgeon:  Erlene Quan C. Donzetta Matters, MD Procedure Performed: 1.  Ultrasound-guided cannulation right common femoral artery 2.  Arch aortogram 3.  Stent of left subclavian artery with 8 x 29 mm VBX 4.  Abdominal aortogram 5.  Moderate sedation with fentanyl Versed for 48 minutes   Indications: 76 year old female with known history of mesenteric artery  stenoses.  This has never been intervened upon.  She more recently was found to have a subtotally occluded left subclavian artery with retrograde flow in her vertebral artery which was noted to be symptomatic.  She is now indicated for angiography possible invention.   Findings: Left subclavian artery was heavily calcified she had retrograde flow in her left vertebral artery.  She appears to have a bovine arch these arteries are all patent.  After intervention of the left subclavian artery there is antegrade flow in the left vertebral artery 0% residual stenosis were previously was occluded.   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  Bilateral renal arteries without stenosis.     LEFT HEART CATH AND CORONARY ANGIOGRAPHY 05/2020  Conclusion   No significant coronary obstructive disease.  The left main is short and immediately trifurcates into a large LAD, ramus immediate and left circumflex vessel.  The midportion of the LAD dips intramyocardially slightly but there is no evidence for systolic bridging.  The RCA is a dominant vessel that has calcification without obstructive stenosis.   Hyperdynamic LV function with EF estimated at 65%.  LVEDP 10 mmHg.  RECOMMENDATION: Medical therapy.     Echo Impression 05/21/20   1. Left ventricular ejection fraction, by estimation, is 45 to 50%. The  left ventricle has mildly decreased function. The left ventricle  demonstrates global hypokinesis. Indeterminate diastolic filling due to  E-A fusion. Definity contrast administered  but images remain suboptimal to exclude regional wall motion  abnormalities.   2. Right ventricular systolic function is normal. The right ventricular  size is normal. Tricuspid regurgitation signal is inadequate for assessing  PA pressure.   3. Left atrial size was mildly dilated.   4. The mitral valve is degenerative. Trivial mitral valve regurgitation.  Mild mitral stenosis. The mean mitral  valve gradient is 3.0 mmHg with  average heart rate of 85 bpm.   5. The aortic valve is grossly normal. Aortic valve regurgitation is not  visualized. No aortic stenosis is present.   6. The inferior vena cava is dilated in size with >50% respiratory  variability, suggesting right atrial pressure of 8 mmHg.     EKG:   11/14/21: Sinus rhythm, rate 62 bpm 12/29/20: NSR with HR 73  Recent Labs: 07/19/2021: TSH 0.44 11/01/2021: B Natriuretic Peptide 123.9 11/07/2021: ALT 21; Hemoglobin 14.8; Magnesium 2.2; Platelets 399 11/14/2021: BUN 31; Creatinine, Ser 1.13; Potassium 3.0; Sodium 134  Recent Lipid Panel    Component Value Date/Time   CHOL 220 (H) 11/06/2021 0523   CHOL 147 06/04/2018 1200   TRIG 50 11/06/2021 0523   TRIG 70 08/28/2006 0847   HDL 119 11/06/2021 0523   HDL 83 06/04/2018 1200   CHOLHDL 1.8 11/06/2021 0523   VLDL 10 11/06/2021 0523   LDLCALC 91 11/06/2021 0523   LDLCALC 49 06/04/2018 1200   LDLDIRECT 88.0 08/18/2013 1001     Physical Exam:    VS:  BP 126/78    Pulse 62    Ht 5\' 6"  (1.676 m)    Wt 135 lb 3.2 oz (61.3 kg)    SpO2 92%    BMI 21.82 kg/m     Wt Readings from Last 3 Encounters:  11/14/21 135 lb 3.2 oz (61.3 kg)  11/14/21 134 lb 4 oz (60.9 kg)  11/07/21 140 lb 14 oz (63.9 kg)     GEN:  Well nourished, well developed in no acute distress HEENT: Normal NECK: No JVD; No carotid bruits CARDIAC: RRR, no murmurs, rubs, gallops RESPIRATORY: Diffuse expiratory wheezing ABDOMEN: Soft, non-tender, non-distended MUSCULOSKELETAL:  No edema; No deformity  SKIN: Warm and dry NEUROLOGIC:  Alert and oriented x 3 PSYCHIATRIC:  Normal affect   ASSESSMENT:    1. Coronary artery disease involving native coronary artery of native heart without angina pectoris   2. Essential hypertension   3. Medication management   4. Hyperlipidemia, unspecified hyperlipidemia type   5. Chronic systolic heart failure ([place])   6. PAD (peripheral artery disease) ([place])   7.  COPD, severe ([place])   8. Bilateral carotid artery stenosis     PLAN:    In order of problems listed above:  #Heart Failure with Improved EF from 45-50%>55-60%  Cath negative for obstructive disease. TTE 07/2021 with recovered EF 55-60%, G2DD from 45-50% in 05/2020. Euvolemic. Unable to tolerate GDMT due to dizziness. Feels well with no anginal or HF symptoms -Continue low dose metoprolol  #Severe PAD: Followed by Dr. Donzetta Matters. Recent left subclavian stenting. -Continue ASA 81, plavix 75 -Continue atorvastatin 80mg  daily -Continue cilostizol -Add zetia 10mg  daily -Follow-up with Dr. Donzetta Matters  #Severe COPD: Has had multiple hospitalizations  for acute exacerbations with most recent in 10/2020. Currently on prednisone taper. -On pred taper -Continue follow-up with pulm -Continue home inhalers  #HLD: -Lipitor 80mg  daily  #Prior smoker: -Has abstained from smoking; doing well  #Mild carotid artery stenosis: 1-39% bilaterally on carotid in 06/2020. -Continue asa, plavix and statin -Repeat carotids at next visit in 3months      Medication Adjustments/Labs and Tests Ordered: Current medicines are reviewed at length with the patient today.  Concerns regarding medicines are outlined above.  Orders Placed This Encounter  Procedures   Lipid Profile   EKG 12-Lead   Meds ordered this encounter  Medications   ezetimibe (ZETIA) 10 MG tablet    Sig: Take 1 tablet (10 mg total) by mouth daily.    Dispense:  90 tablet    Refill:  3    Patient Instructions  Medication Instructions:   START TAKING ZETIA 10 MG BY MOUTH DAILY  *If you need a refill on your cardiac medications before your next appointment, please call your pharmacy*   Lab Work:  IN 6-8 WEEKS HERE IN THE OFFICE TO CHECK--LIPIDS--PLEASE COME FASTING TO THIS LAB APPOINTMENT  If you have labs (blood work) drawn today and your tests are completely normal, you will receive your results only by: [place] (if you have  MyChart) OR A paper copy in the mail If you have any lab test that is abnormal or we need to change your treatment, we will call you to review the results.   Follow-Up: At [facility], you and your health needs are our priority.  As part of our continuing mission to provide you with exceptional heart care, we have created designated Provider Care Teams.  These Care Teams include your primary Cardiologist (physician) and Advanced Practice Providers (APPs -  Physician Assistants and Nurse Practitioners) who all work together to provide you with the care you need, when you need it.  We recommend signing up for the patient portal called "MyChart".  Sign up information is provided on this After Visit Summary.  MyChart is used to connect with patients for Virtual Visits (Telemedicine).  Patients are able to view lab/test results, encounter notes, upcoming appointments, etc.  Non-urgent messages can be sent to your provider as well.   To learn more about what you can do with MyChart, go to [email or web address].    Your next appointment:   6 month(s)  The format for your next appointment:   In Person  Provider:   DR. Reece Leader as a scribe for Freada Bergeron, MD.,have documented all relevant documentation on the behalf of Freada Bergeron, MD,as directed by  Freada Bergeron, MD while in the presence of Freada Bergeron, MD.  I, Freada Bergeron, MD, have reviewed all documentation for this visit. The documentation on 11/14/21 for the exam, diagnosis, procedures, and orders are all accurate and complete.   Signed, Freada Bergeron, MD  11/14/2021 5:23 PM    [employer]

## 2021-11-14 NOTE — Patient Instructions (Signed)
A few things to remember from today's visit:   Thrush, oral  B12 deficiency - Plan: Vitamin B12  COPD, frequent exacerbations ([place])  HFrEF (heart failure with reduced ejection fraction) ([place]) - Plan: Basic metabolic panel  Anxiety disorder, unspecified type  If you need refills please call your pharmacy. Do not use My Chart to request refills or for acute issues that need immediate attention.   Nystatin for 10 days then as needed. Rinse mouth after inhaler use and Pulmicort.  Complete Prednisone. Go back to Protonix and stop Nexium. Paroxetine to take during the day. No changes in rest.  Please be sure medication list is accurate. If a new problem present, please set up appointment sooner than planned today.

## 2021-11-15 MED ORDER — POTASSIUM CHLORIDE CRYS ER 20 MEQ PO TBCR: 20.0000 meq | EXTENDED_RELEASE_TABLET | Freq: Every day | ORAL | 3 refills | Status: DC

## 2021-11-15 NOTE — Telephone Encounter (Signed)
Yesterday she told me Nexium did not help as Protonix did, so she was instructed to go back to Protonix. Thanks, BJ

## 2021-11-23 ENCOUNTER — Other Ambulatory Visit: Payer: Self-pay | Admitting: Primary Care

## 2021-11-29 ENCOUNTER — Other Ambulatory Visit: Payer: Self-pay | Admitting: Family Medicine

## 2021-11-29 DIAGNOSIS — B370 Candidal stomatitis: Secondary | ICD-10-CM

## 2021-11-30 ENCOUNTER — Other Ambulatory Visit: Payer: Self-pay

## 2021-11-30 ENCOUNTER — Inpatient Hospital Stay: Payer: Medicare Other | Attending: Adult Health | Admitting: Adult Health

## 2021-11-30 ENCOUNTER — Other Ambulatory Visit: Payer: Self-pay | Admitting: *Deleted

## 2021-11-30 VITALS — BP 139/59 | HR 86 | Temp 97.7°F | Resp 18 | Ht 66.0 in | Wt 137.4 lb

## 2021-11-30 DIAGNOSIS — Z7952 Long term (current) use of systemic steroids: Secondary | ICD-10-CM | POA: Insufficient documentation

## 2021-11-30 DIAGNOSIS — I739 Peripheral vascular disease, unspecified: Secondary | ICD-10-CM

## 2021-11-30 DIAGNOSIS — D75839 Thrombocytosis, unspecified: Secondary | ICD-10-CM | POA: Insufficient documentation

## 2021-11-30 DIAGNOSIS — Z853 Personal history of malignant neoplasm of breast: Secondary | ICD-10-CM | POA: Diagnosis present

## 2021-11-30 DIAGNOSIS — Z9011 Acquired absence of right breast and nipple: Secondary | ICD-10-CM | POA: Insufficient documentation

## 2021-11-30 DIAGNOSIS — Z79899 Other long term (current) drug therapy: Secondary | ICD-10-CM | POA: Diagnosis not present

## 2021-11-30 DIAGNOSIS — Z7951 Long term (current) use of inhaled steroids: Secondary | ICD-10-CM | POA: Diagnosis not present

## 2021-11-30 DIAGNOSIS — C50811 Malignant neoplasm of overlapping sites of right female breast: Secondary | ICD-10-CM

## 2021-11-30 DIAGNOSIS — Z170 Estrogen receptor positive status [ER+]: Secondary | ICD-10-CM

## 2021-11-30 MED ORDER — CILOSTAZOL 100 MG PO TABS: 100.0000 mg | ORAL_TABLET | Freq: Two times a day (BID) | ORAL | 1 refills | Status: DC

## 2021-11-30 NOTE — Progress Notes (Signed)
CLINIC:  Survivorship   REASON FOR VISIT:  Routine follow-up for history of breast cancer.   BRIEF ONCOLOGIC HISTORY:  Per Dr. Jana Hakim note from 06/21/2017:  [place] woman: history of right breast cancer: pT1c pN0, stage IA, estrogen and progesterone receptor positive.  1. S/p right mastectomy with myocutaneous flap 09/19/2009   2. Adjuvant chemo with 4 cycles of  Taxotere/ Cytoxan   3. Femara, followed by Arimidex, started roughly Jan 2011, with a few months' interruption   4.  Completed 5 years of antiestrogen therapy June 2016   5.  Also s/p left breast lumpectomy in may 2014 ,negative for atypia or malignancy   6. Intermittent thrombocytosis, mild likely multifocal factorial (borderline iron deficiency, positive ANA and elevated C-reactive protein)   INTERVAL HISTORY:  Ms. Blasing presents to the Survivorship [facility] today for routine follow-up for her history of breast cancer.  Overall, she reports feeling quite well.   Her most recent mammogram that we have on file completed in 05/2019 that was normal.  She notes that she is doing moderately well.  She was previously hospitalized for COPD exacerbation in January 2023.  She sees Dr. Chase Caller in pulmonary clinic and tells me that she is maxed out on her treatments for her COPD.  She does have oxygen that she wears from time to time and when she walks around without oxygen her saturations dipped down into the 80s.  Alesi denies any concerns as it relates to her breast cancer she has no new pain noted today she will wait will.  She notes she needs to get her mammogram scheduled soon.   REVIEW OF SYSTEMS:  Review of Systems  Constitutional:  Positive for fatigue. Negative for appetite change, chills, fever and unexpected weight change.  HENT:   Negative for hearing loss, lump/mass and trouble swallowing.   Eyes:  Negative for eye problems and icterus.  Respiratory:  Positive for shortness of breath. Negative for chest tightness  and cough.   Cardiovascular:  Negative for chest pain, leg swelling and palpitations.  Gastrointestinal:  Negative for abdominal distention, abdominal pain, constipation, diarrhea, nausea and vomiting.  Endocrine: Negative for hot flashes.  Genitourinary:  Negative for difficulty urinating.   Musculoskeletal:  Negative for arthralgias and gait problem.  Skin:  Negative for itching and rash.  Neurological:  Negative for dizziness, extremity weakness, gait problem, headaches and numbness.  Hematological:  Negative for adenopathy. Does not bruise/bleed easily.  Psychiatric/Behavioral:  Negative for depression. The patient is not nervous/anxious.  Breast: Denies any new nodularity, masses, tenderness, nipple changes, or nipple discharge.       PAST MEDICAL/SURGICAL HISTORY:  Past Medical History:  Diagnosis Date   Bilateral leg cramps    Bladder neoplasm    Cancer (HCC)    CHF (congestive heart failure) (HCC)    Chronic deep vein thrombosis (DVT) of right lower extremity ([place])    05/ 2018  chronic non-occlusive DVT RLE   COPD, frequent exacerbations ([place])    03-06-2018 per pt last exacerbation 12/ 2018   Coronary artery disease    cardiologist-  dr Aundra Dubin-- 03-11-2018 er cath , 80% stenosis in the ostial second diagonal   Dyspnea on minimal exertion    Emphysema/COPD ([place])    CAT score- 17   Family history of breast cancer    Family history of colon cancer    Family history of melanoma    Family history of ovarian cancer    Family history of pancreatic cancer  Fibromyalgia 1995   GERD (gastroesophageal reflux disease)    Hiatal hernia    History of breast cancer    History of diverticulitis of colon    2002  s/p  sigmoid colectomy   History of multiple pulmonary nodules    hx RUL nodules x2  s/p right VATS w/ wedge resection 09-11-2005 and 06-14-2011  both  necrotizing granulomatous inflammation w/ cystic area of necrosis & focal calcification   History of right breast  cancer oncologist-  dr Jana Hakim-- no recurrence   dx 2010--  IDC, Stage IA , ER/PR+,  (pT1c pN0) 11-09-2008 right lumpectomy;  12-18-2008 right simple mastectomy for DCIS margins;  completed chemotherapy 2010 (no radiation) and completed antiestrogen therapy   Hx of colonic polyps    Dr. 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-- dr chen/ dr Donzetta Matters   proximal right SFA severe focal stenosis with collaterals from the PFA/  04/ 2012 occluded celiac and SMA arteries with distal reconstitution w/ patent IMA   Peripheral vascular disease ([place])    chronic DVT RLE,  mesenteric vascular disease   Right lower lobe pulmonary nodule    Chest CT 08-29-2017   Stage 3 severe COPD by GOLD classification ([place]) hx frequent exacerbations--   pulmologist-  dr Maryann Alar--  per lov note , dated 12-20-2017, oxyogen 2L is prescribed for use with exertion (but pt only uses mostly at night), O2 sats on RA run the 80s , this day sat 86% RA and with 2L O2 sat 92%   Varicose veins of leg with swelling    varicose vein surgery - Dr. Aleda Grana   Wears glasses    Wears hearing aid in both ears    Past Surgical History:  Procedure Laterality Date   ABDOMINAL AORTOGRAM N/A 07/11/2020   Procedure: ABDOMINAL AORTOGRAM;  Surgeon: Waynetta Sandy, MD;  Location: [facility];  Service: Cardiovascular;  Laterality: N/A;   AORTIC ARCH ANGIOGRAPHY N/A 07/11/2020   Procedure: AORTIC ARCH ANGIOGRAPHY;  Surgeon: Waynetta Sandy, MD;  Location: [facility];  Service: Cardiovascular;  Laterality: N/A;   BREAST  EXCISIONAL BIOPSY Left 10/15/2008   BREAST LUMPECTOMY W/ NEEDLE LOCALIZATION Right 11-09-2008    dr cornett   [place]  03-12-2011   dr Aundra Dubin   70-80% ostial stenosis in small second diagonal (appears to small for intervention),  dLAD 40-50%,  minimal luminal irregulartieis involoving LCFx and RCA,  LVEF 55%   CATARACT EXTRACTION W/ INTRAOCULAR LENS  IMPLANT, BILATERAL  2011   CYSTOSCOPY N/A 03/11/2018   Procedure: CYSTOSCOPY WITH INSTILLATION OF POST OPERATIVE EPIRUBICIN;  Surgeon: Festus Aloe, MD;  Location: [facility];  Service: Urology;  Laterality: N/A;   LEFT HEART CATH AND CORONARY ANGIOGRAPHY N/A 05/25/2020   Procedure: LEFT HEART CATH AND CORONARY ANGIOGRAPHY;  Surgeon: Troy Sine, MD;  Location: [facility];  Service: Cardiovascular;  Laterality: N/A;   MASTECTOMY Right    PARTIAL COLECTOMY  2002   sigmoid and appectomy (diverticulitis)  PARTIAL MASTECTOMY WITH NEEDLE LOCALIZATION Left 02/23/2013   Procedure:  LEFT PARTIAL MASTECTOMY WITH NEEDLE LOCALIZATION;  Surgeon: Adin Hector, MD;  Location: [facility];  Service: General;  Laterality: Left;   PERIPHERAL VASCULAR INTERVENTION  07/11/2020   Procedure: PERIPHERAL VASCULAR INTERVENTION;  Surgeon: Waynetta Sandy, MD;  Location: [facility];  Service: Cardiovascular;;   RECONSTRUCTION BREAST W/ LATISSIMUS DORSI FLAP Right 05-30-2009   dr Harlow Mares  [facility]   REDUCTION MAMMAPLASTY Left    SIMPLE MASTECTOMY Right 12-28-2008    dr cornett  [facility]   TRANSTHORACIC ECHOCARDIOGRAM  07-09-2016  dr Aundra Dubin   ef 55-60%, grade 1 diastoic dysfunction/  mild TR   TRANSURETHRAL RESECTION OF BLADDER TUMOR N/A 03/11/2018   Procedure: TRANSURETHRAL RESECTION OF BLADDER TUMOR (TURBT) 2-5cm;  Surgeon: Festus Aloe, MD;  Location: [facility];  Service: Urology;  Laterality: N/A;   UPPER EXTREMITY ANGIOGRAPHY Left 07/11/2020   Procedure: Upper Extremity Angiography;  Surgeon: Waynetta Sandy, MD;  Location: [facility];  Service: Cardiovascular;  Laterality: Left;   VAGINAL HYSTERECTOMY  1973   VIDEO ASSISTED THORACOSCOPY (VATS)/WEDGE RESECTION Right 09-11-2005  &  06-14-2011   dr Fara Boros  [facility]   both RUL     ALLERGIES:  Allergies  Allergen Reactions   Sinequan [Doxepin] Other (See Comments)    Kept her awake    Sulfa Antibiotics Swelling   Sulfur     Other reaction(s): Unknown     CURRENT MEDICATIONS:  Outpatient Encounter Medications as of 11/30/2021  Medication Sig   albuterol (VENTOLIN HFA) 108 (90 Base) MCG/ACT inhaler TAKE 2 PUFFS BY MOUTH EVERY 6 HOURS AS NEEDED FOR WHEEZE OR SHORTNESS OF BREATH   aspirin EC 81 MG tablet Take 1 tablet (81 mg total) by mouth daily.   atorvastatin (LIPITOR) 80 MG tablet Take 1 tablet (80 mg total) by mouth daily.   budesonide (PULMICORT) 0.5 MG/2ML nebulizer solution Take 2 mLs (0.5 mg total) by nebulization 2 (two) times daily.   cilostazol (PLETAL) 100 MG tablet Take 1 tablet (100 mg total) by mouth 2 (two) times daily.   clopidogrel (PLAVIX) 75 MG tablet TAKE 1 TABLET BY MOUTH EVERY DAY   DUPIXENT 300 MG/2ML SOPN Inject 300 mg into the skin every 14 (fourteen) days. Every other Friday   ezetimibe (ZETIA) 10 MG tablet Take 1 tablet (10 mg total) by mouth daily.   ferrous sulfate 325 (65 FE) MG tablet TAKE 1 TABLET BY MOUTH EVERY DAY WITH BREAKFAST   fluticasone (FLONASE) 50 MCG/ACT nasal spray SPRAY 1 SPRAY INTO EACH NOSTRIL TWICE A DAY   Fluticasone-Umeclidin-Vilant (TRELEGY ELLIPTA) 200-62.5-25 MCG/INH AEPB Inhale 1 puff into the lungs daily.   furosemide (LASIX) 40 MG tablet TAKE 1 TABLET BY MOUTH TWICE A DAY   gabapentin (NEURONTIN) 300 MG capsule Take 300 mg by mouth 4 (four) times daily as needed (for nerve pain).   HYDROcodone-acetaminophen (NORCO/VICODIN) 5-325 MG tablet Take 1 tablet by mouth at bedtime.   hydrOXYzine (ATARAX/VISTARIL) 25 MG tablet Take 25 mg by mouth daily as needed for itching.    ipratropium-albuterol (DUONEB) 0.5-2.5 (3) MG/3ML SOLN Inhale 3 mLs into the lungs as directed. TID for 1 week and then use as needed Q6 hours.   levothyroxine (SYNTHROID) 112 MCG tablet TAKE 1 TABLET BY MOUTH EVERY DAY IN THE MORNING BEFORE BREAKFAST   LORazepam (ATIVAN) 2 MG tablet TAKE 1 TABLET BY MOUTH AT BEDTIME.   montelukast (SINGULAIR) 10 MG tablet TAKE  1 TABLET BY MOUTH EVERYDAY AT BEDTIME   nystatin (MYCOSTATIN) 100000 UNIT/ML suspension Take 5 mLs (500,000 Units total) by mouth 4 (four) times daily.   pantoprazole (PROTONIX) 40 MG tablet Take 1 tablet (40 mg total) by mouth daily.   PARoxetine (PAXIL) 20 MG tablet Take 1 tablet (20 mg total) by mouth daily.   potassium chloride SA (KLOR-CON M) 20 MEQ tablet Take 1 tablet (20 mEq total) by mouth daily.   predniSONE (DELTASONE) 10 MG tablet Take 65m daily for 3days,Take 458mdaily for 3days,Take 3024maily for 3days,Take 63m60mily for 3days,Take 10mg36mly for 3days, then stop   tamsulosin (FLOMAX) 0.4 MG CAPS capsule TAKE 1 CAPSULE BY MOUTH EVERY DAY   traZODone (DESYREL) 100 MG tablet TAKE 1 TABLET BY MOUTH EVERYDAY AT BEDTIME   No facility-administered encounter medications on file as of 11/30/2021.     ONCOLOGIC FAMILY HISTORY:  Family History  Problem Relation Age of Onset   Colon cancer Mother        colon   COPD Mother        brown lung   Hypertension Mother    Diabetes Mother    Emphysema Mother    Coronary artery disease Father    Heart attack Father    Sudden death Father    Heart disease Father    Cancer Sister        throat   Hypertension Sister    Coronary artery disease Brother    Heart attack Brother        early 50s  54sroat cancer Sister    Ovarian cancer Sister        fallopian tube cancer in her 20s  69slanoma Sister    Hypertension Sister    Pancreatic cancer Sister    Melanoma Niece        dx in her 40s  74seast cancer Niece 45   34ENETIC COUNSELING/TESTING: Genetic testing reported out  on December 09, 2018 through the multi-cancer panel found no deleterious mutations.  The Multi-Gene Panel offered by [facility] includes sequencing and/or deletion duplication testing of the following 84 genes: AIP, ALK, APC, ATM, AXIN2,BAP1,  BARD1, BLM, BMPR1A, BRCA1, BRCA2, BRIP1, CASR, CDC73, CDH1, CDK4, CDKN1B, CDKN1C, CDKN2A (p14ARF), CDKN2A (p16INK4a), CEBPA, CHEK2, CTNNA1, DICER1, DIS3L2, EGFR (c.2369C>T, p.Thr790Met variant only), EPCAM (Deletion/duplication testing only), FH, FLCN, GATA2, GPC3, GREM1 (Promoter region deletion/duplication testing only), HOXB13 (c.251G>A, p.Gly84Glu), HRAS, KIT, MAX, MEN1, MET, MITF (c.952G>A, p.Glu318Lys variant only), MLH1, MSH2, MSH3, MSH6, MUTYH, NBN, NF1, NF2, NTHL1, PALB2, PDGFRA, PHOX2B, PMS2, POLD1, POLE, POT1, PRKAR1A, PTCH1, PTEN, RAD50, RAD51C, RAD51D, RB1, RECQL4, RET, RUNX1, SDHAF2, SDHA (sequence changes only), SDHB, SDHC, SDHD, SMAD4, SMARCA4, SMARCB1, SMARCE1, STK11, SUFU, TERC, TERT, TMEM127, TP53, TSC1, TSC2, VHL, WRN and WT1.   The test report has been scanned into EPIC and is located under the Molecular Pathology section of the Results Review tab.    SOCIAL HISTORY:  Birdella ALEXSA FLAUMarried and [place].  Her younger son recently moved in to help care for her and her husband.  Ms. TayloPecorurrently retired.  She denies any current or history of tobacco, alcohol, or illicit drug use.  (reviewed 11/24/2019)   PHYSICAL EXAMINATION:  Vital Signs: Vitals:   11/30/21 1432  BP: (!) 139/59  Pulse: 86  Resp: 18  Temp: 97.7 F (36.5 C)  SpO2: 93%   Filed Weights  11/30/21 1432  Weight: 137 lb 6.4 oz (62.3 kg)   General: Well-nourished, well-appearing female in no acute distress.  Unaccompanied today.   HEENT: Head is normocephalic.  Pupils equal and reactive to light. Conjunctivae clear without exudate.  Sclerae anicteric. Oral mucosa is pink, moist.  Oropharynx is pink  without lesions or erythema.  Lymph: No cervical, supraclavicular, or infraclavicular lymphadenopathy noted on palpation.  Cardiovascular: Regular rate and rhythm.Marland Kitchen Respiratory: wheezes in upper airways, diminished/decreased air movement in bilateral lower lungs Breast Exam:  -Left breast: No appreciable masses on palpation. No skin redness, thickening, or peau d'orange appearance; no nipple retraction or nipple discharge; mild distortion in symmetry at previous lumpectomy site well healed scar without erythema or nodularity.  -Right breast: s/p mastecomy and reconstruction, well healed, no swelling or sign of recurrence. -Axilla: No axillary adenopathy bilaterally.  GI: Abdomen soft and round; non-tender, non-distended. Bowel sounds normoactive. No hepatosplenomegaly.   GU: Deferred.  Neuro: No focal deficits. Steady gait.  Psych: Mood and affect normal and appropriate for situation.  MSK: No focal spinal tenderness to palpation, full range of motion in bilateral upper extremities Extremities: No edema. Skin: Warm and dry.  LABORATORY DATA: No visits with results within 1 Day(s) from this visit.  Latest known visit with results is:  Office Visit on 11/14/2021  Component Date Value Ref Range Status   Vitamin B-12 11/14/2021 191 (L)  211 - 911 pg/mL Final   Sodium 11/14/2021 134 (L)  135 - 145 mEq/L Final   Potassium 11/14/2021 3.0 (L)  3.5 - 5.1 mEq/L Final   Chloride 11/14/2021 93 (L)  96 - 112 mEq/L Final   CO2 11/14/2021 29  19 - 32 mEq/L Final   Glucose, Bld 11/14/2021 128 (H)  70 - 99 mg/dL Final   BUN 11/14/2021 31 (H)  6 - 23 mg/dL Final   Creatinine, Ser 11/14/2021 1.13  0.40 - 1.20 mg/dL Final   GFR 11/14/2021 47.10 (L)  >60.00 mL/min Final   Calculated using the CKD-EPI Creatinine Equation (2021)   Calcium 11/14/2021 12.5 (H)  8.4 - 10.5 mg/dL Final      DIAGNOSTIC IMAGING:  CLINICAL DATA:  Screening.   EXAM: DIGITAL SCREENING UNILATERAL LEFT MAMMOGRAM WITH CAD AND  TOMO   COMPARISON:  Previous exam(s).   ACR Breast Density Category c: The breast tissue is heterogeneously dense, which may obscure small masses.   FINDINGS: The patient has had a right mastectomy. There are no findings suspicious for malignancy.   Images were processed with CAD.   IMPRESSION: No mammographic evidence of malignancy. A result letter of this screening mammogram will be mailed directly to the patient.   RECOMMENDATION: Screening mammogram in one year.  (Code:SM-L-14M)   BI-RADS CATEGORY  1: Negative.     Electronically Signed   By: Dorise Bullion III M.D   On: 06/04/2019 17:09      ASSESSMENT AND PLAN:  Ms.. Masci is a pleasant 76 y.o. female with history of Stage IA right breast invasive ductal carcinoma, ER+/PR+/HER2-, diagnosed in 2010, treated with mastectomy, adjuvant chemotherapy, Femara/Arimidex x 5 years completed in 03/2015.  Of note she is s/p left lumpectomy in 02/2013 neg for atypia or malignancy.  She also has intermittent thrombocytosis.  She presents to the Survivorship Clinic for surveillance and routine follow-up.   1. History of breast cancer:  Ms. Fulginiti is currently clinically and radiographically without evidence of disease or recurrence of breast cancer.  She is overdue for repeat mammogram  and I recommended she have this completed at [facility]. She will return to see me in one year for repeat long term follow up.  I encouraged her to call me with any questions or concerns before her next visit at the cancer center, and I would be happy to see her sooner, if needed.    2. COPD/CHF: She will continue to f/u with pulmonology and cardiology about these issues, these continue to be her primary concerns/issues at this point.    3. Bone health:  Given Ms. Boreman's age, history of breast cancer, and her previous anti-estrogen therapy with aromatase inhibitors, she is at risk for bone demineralization. I will defer to her PCP regarding bone density testing  and management.  She was given education on specific food and activities to promote bone health.  4. Cancer screening:  Due to Ms. Markert's history and her age, she should receive screening for skin cancers, colon cancer. She was encouraged to follow-up with her PCP for appropriate cancer screenings.   5. Health maintenance and wellness promotion: Ms. Mathison was encouraged to consume 5-7 servings of fruits and vegetables per day. She was also encouraged to be as active as possibleShe was instructed to limit her alcohol consumption and continue to abstain from tobacco use.     Dispo:  -Return to cancer center in one year for LTS follow up -Mammogram due   Total encounter time: 20 minutes*in face-to-face visit time, chart review, lab review, care coordination, order entry, and documentation of the encounter.   Wilber Bihari, NP 12/03/21 10:08 AM Medical Oncology and Hematology [facility] [place], [place] [address] Tel. [phone]    Fax. [phone]   *Total Encounter Time as defined by the Centers for Medicare and Medicaid Services includes, in addition to the face-to-face time of a patient visit (documented in the note above) non-face-to-face time: obtaining and reviewing outside history, ordering and reviewing medications, tests or procedures, care coordination (communications with other health care professionals or caregivers) and documentation in the medical record.   Note: PRIMARY CARE PROVIDER [place], [name], [place] [phone]

## 2021-12-03 ENCOUNTER — Encounter: Payer: Self-pay | Admitting: Adult Health

## 2021-12-04 ENCOUNTER — Other Ambulatory Visit (INDEPENDENT_AMBULATORY_CARE_PROVIDER_SITE_OTHER): Payer: Medicare Other

## 2021-12-04 ENCOUNTER — Ambulatory Visit (INDEPENDENT_AMBULATORY_CARE_PROVIDER_SITE_OTHER): Payer: Medicare Other

## 2021-12-04 DIAGNOSIS — E538 Deficiency of other specified B group vitamins: Secondary | ICD-10-CM

## 2021-12-04 DIAGNOSIS — E876 Hypokalemia: Secondary | ICD-10-CM

## 2021-12-04 LAB — BASIC METABOLIC PANEL
BUN: 13 mg/dL (ref 6–23)
CO2: 35 mEq/L — ABNORMAL HIGH (ref 19–32)
Calcium: 9.9 mg/dL (ref 8.4–10.5)
Chloride: 98 mEq/L (ref 96–112)
Creatinine, Ser: 1.16 mg/dL (ref 0.40–1.20)
GFR: 45.62 mL/min — ABNORMAL LOW (ref >60.00)
Glucose, Bld: 111 mg/dL — ABNORMAL HIGH (ref 70–99)
Potassium: 3.6 mEq/L (ref 3.5–5.1)
Sodium: 138 mEq/L (ref 135–145)

## 2021-12-04 LAB — MAGNESIUM: Magnesium: 1.9 mg/dL (ref 1.5–2.5)

## 2021-12-04 MED ORDER — CYANOCOBALAMIN 1000 MCG/ML IJ SOLN
1000.0000 ug | Freq: Once | INTRAMUSCULAR | Status: AC
  Administered 2021-12-04: 1000 ug via INTRAMUSCULAR

## 2021-12-04 NOTE — Progress Notes (Signed)
Per orders of Dr. Jordan, injection of B12 given by Jilleen Essner E Eyvonne Burchfield. Patient tolerated injection well.  

## 2021-12-07 ENCOUNTER — Other Ambulatory Visit: Payer: Self-pay | Admitting: Primary Care

## 2021-12-12 ENCOUNTER — Ambulatory Visit (INDEPENDENT_AMBULATORY_CARE_PROVIDER_SITE_OTHER): Payer: Medicare Other

## 2021-12-12 DIAGNOSIS — E538 Deficiency of other specified B group vitamins: Secondary | ICD-10-CM

## 2021-12-12 MED ORDER — CYANOCOBALAMIN 1000 MCG/ML IJ SOLN
1000.0000 ug | INTRAMUSCULAR | Status: AC
  Administered 2021-12-12 – 2021-12-27 (×3): 1000 ug via INTRAMUSCULAR

## 2021-12-12 NOTE — Progress Notes (Signed)
Pt here for weekly B12 injection #2 of 4 per Dr Martinique.  B12 1058mcg given IM left deltoid and pt tolerated injection well.  Pt has f/u appt scheduled with PCP on 12/18/21 & can receive weekly b12 injection #3 at that time.

## 2021-12-13 ENCOUNTER — Other Ambulatory Visit: Payer: Self-pay | Admitting: Family Medicine

## 2021-12-13 DIAGNOSIS — F419 Anxiety disorder, unspecified: Secondary | ICD-10-CM

## 2021-12-14 ENCOUNTER — Other Ambulatory Visit: Payer: Self-pay | Admitting: Family Medicine

## 2021-12-14 DIAGNOSIS — B370 Candidal stomatitis: Secondary | ICD-10-CM

## 2021-12-15 NOTE — Progress Notes (Deleted)
HPI:  [name] is a 76 y.o. female, who is here today to follow on recent visit.  Review of Systems Rest see pertinent positives and negatives per HPI.  Current Outpatient Medications on File Prior to Visit  Medication Sig Dispense Refill   albuterol (VENTOLIN HFA) 108 (90 Base) MCG/ACT inhaler TAKE 2 PUFFS BY MOUTH EVERY 6 HOURS AS NEEDED FOR WHEEZE OR SHORTNESS OF BREATH 8.5 each 3   aspirin EC 81 MG tablet Take 1 tablet (81 mg total) by mouth daily. 30 tablet    atorvastatin (LIPITOR) 80 MG tablet Take 1 tablet (80 mg total) by mouth daily. 90 tablet 2   budesonide (PULMICORT) 0.5 MG/2ML nebulizer solution Take 2 mLs (0.5 mg total) by nebulization 2 (two) times daily. 20 mL 12   cilostazol (PLETAL) 100 MG tablet Take 1 tablet (100 mg total) by mouth 2 (two) times daily. 180 tablet 1   clopidogrel (PLAVIX) 75 MG tablet TAKE 1 TABLET BY MOUTH EVERY DAY 90 tablet 3   DUPIXENT 300 MG/2ML SOPN Inject 300 mg into the skin every 14 (fourteen) days. Every other Friday     ezetimibe (ZETIA) 10 MG tablet Take 1 tablet (10 mg total) by mouth daily. 90 tablet 3   ferrous sulfate 325 (65 FE) MG tablet TAKE 1 TABLET BY MOUTH EVERY DAY WITH BREAKFAST 90 tablet 1   fluticasone (FLONASE) 50 MCG/ACT nasal spray SPRAY 1 SPRAY INTO EACH NOSTRIL TWICE A DAY 48 mL 1   Fluticasone-Umeclidin-Vilant (TRELEGY ELLIPTA) 200-62.5-25 MCG/INH AEPB Inhale 1 puff into the lungs daily. 60 each 3   furosemide (LASIX) 40 MG tablet TAKE 1 TABLET BY MOUTH TWICE A DAY 180 tablet 1   gabapentin (NEURONTIN) 300 MG capsule Take 300 mg by mouth 4 (four) times daily as needed (for nerve pain).     HYDROcodone-acetaminophen (NORCO/VICODIN) 5-325 MG tablet Take 1 tablet by mouth at bedtime.     hydrOXYzine (ATARAX/VISTARIL) 25 MG tablet Take 25 mg by mouth daily as needed for itching.     ipratropium-albuterol (DUONEB) 0.5-2.5 (3) MG/3ML SOLN Inhale 3 mLs into the lungs as directed. TID for 1 week and then use as needed  Q6 hours. 360 mL 4   levothyroxine (SYNTHROID) 112 MCG tablet TAKE 1 TABLET BY MOUTH EVERY DAY IN THE MORNING BEFORE BREAKFAST 90 tablet 2   LORazepam (ATIVAN) 2 MG tablet TAKE 1 TABLET BY MOUTH AT BEDTIME. 30 tablet 3   montelukast (SINGULAIR) 10 MG tablet TAKE 1 TABLET BY MOUTH EVERYDAY AT BEDTIME 90 tablet 1   nystatin (MYCOSTATIN) 100000 UNIT/ML suspension Take 5 mLs (500,000 Units total) by mouth 3 (three) times daily for 21 days. 480 mL 0   pantoprazole (PROTONIX) 40 MG tablet Take 1 tablet (40 mg total) by mouth daily. 30 tablet 0   PARoxetine (PAXIL) 20 MG tablet Take 1 tablet (20 mg total) by mouth daily. 90 tablet 2   potassium chloride SA (KLOR-CON M) 20 MEQ tablet Take 1 tablet (20 mEq total) by mouth daily. 30 tablet 3   predniSONE (DELTASONE) 10 MG tablet Take 50mg  daily for 3days,Take 40mg  daily for 3days,Take 30mg  daily for 3days,Take 20mg  daily for 3days,Take 10mg  daily for 3days, then stop 45 tablet 0   tamsulosin (FLOMAX) 0.4 MG CAPS capsule TAKE 1 CAPSULE BY MOUTH EVERY DAY 90 capsule 0   traZODone (DESYREL) 100 MG tablet TAKE 1 TABLET BY MOUTH EVERYDAY AT BEDTIME 90 tablet 1   Current Facility-Administered Medications on File  Prior to Visit  Medication Dose Route Frequency Provider Last Rate Last Admin   cyanocobalamin ((VITAMIN B-12)) injection 1,000 mcg  1,000 mcg Intramuscular Weekly [place], [name], MD   1,000 mcg at 12/12/21 1408    Past Medical History:  Diagnosis Date   Bilateral leg cramps    Bladder neoplasm    Cancer [facility])    CHF (congestive heart failure) (HCC)    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)    05/ 2018  chronic non-occlusive DVT RLE   COPD, frequent exacerbations (HCC)    03-06-2018 per pt last exacerbation 12/ 2018   Coronary artery disease    cardiologist-  dr Shirlee Latch-- 03-11-2018 er cath , 80% stenosis in the ostial second diagonal   Dyspnea on minimal exertion    Emphysema/COPD (HCC)    CAT score- 17   Family  history of breast cancer    Family history of colon cancer    Family history of melanoma    Family history of ovarian cancer    Family history of pancreatic cancer    Fibromyalgia 1995   GERD (gastroesophageal reflux disease)    Hiatal hernia    History of breast cancer    History of diverticulitis of colon    2002  s/p  sigmoid colectomy   History of multiple pulmonary nodules    hx RUL nodules x2  s/p right VATS w/ wedge resection 09-11-2005 and 06-14-2011  both  necrotizing granulomatous inflammation w/ cystic area of necrosis & focal calcification   History of right breast cancer oncologist-  dr [name]-- no recurrence   dx 2010--  IDC, Stage IA , ER/PR+,  (pT1c pN0) 11-09-2008 right lumpectomy;  12-18-2008 right simple mastectomy for DCIS margins;  completed chemotherapy 2010 (no radiation) and completed antiestrogen therapy   Hx of colonic polyps    Dr. 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   Nocturia    OA (osteoarthritis)    thumbs   On supplemental oxygen therapy    03-06-2018 per pt uses only at night,  checks O2 sats at home,  stated am sat 89% after moving around average 93-94% with RA   Osteoporosis    Peripheral arterial occlusive disease (HCC) vascular-- dr chen/ dr Randie Heinz   proximal right SFA severe focal stenosis with collaterals from the PFA/  04/ 2012 occluded celiac and SMA arteries with distal reconstitution w/ patent IMA   Peripheral vascular disease (HCC)    chronic DVT RLE,  mesenteric vascular disease   Right lower lobe pulmonary nodule    Chest CT 08-29-2017   Stage 3 severe COPD by GOLD classification (HCC) hx frequent exacerbations--   pulmologist-  dr Inocente Salles--  per lov note , dated 12-20-2017, oxyogen 2L is prescribed for use with exertion (but pt only uses mostly at night), O2 sats on RA run the 80s , this day sat 86% RA and with 2L O2 sat  92%   Varicose veins of leg with swelling    varicose vein surgery - Dr. Guss Bunde   Wears glasses    Wears hearing aid in both ears    Allergies  Allergen Reactions   Sinequan [Doxepin] Other (See Comments)    Kept her awake    Sulfa Antibiotics Swelling   Sulfur     Other reaction(s): Unknown  Social History   Socioeconomic History   Marital status: Married    Spouse name: Not on file   Number of children: 3   Years of education: 12   Highest education level: Not on file  Occupational History   Occupation: Magazine features editor: RETIRED    Comment: retired  Tobacco Use   Smoking status: Former    Packs/day: 0.75    Years: 50.00    Pack years: 37.50    Types: Cigarettes    Quit date: 05/18/2016    Years since quitting: 5.5   Smokeless tobacco: Never  Vaping Use   Vaping Use: Never used  Substance and Sexual Activity   Alcohol use: Yes    Alcohol/week: 10.0 standard drinks    Types: 10 Cans of beer per week   Drug use: No   Sexual activity: Not on file  Other Topics Concern   Not on file  Social History Narrative   HSG, beauty school. Married '69 -7 yrs/widowed; married '79 - 8yr/divorced; married '87.  2 sons - '70, '74, 1 srtep-son; 1 grandchild. 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 SO - good health.  NO history of abuse.  ACP - full code and all heroic measures.    Social Determinants of Health   Financial Resource Strain: Low Risk    Difficulty of Paying Living Expenses: Not hard at all  Food Insecurity: No Food Insecurity   Worried About Programme researcher, broadcasting/film/video in the Last Year: Never true   Ran Out of Food in the Last Year: Never true  Transportation Needs: No Transportation Needs   Lack of Transportation (Medical): No   Lack of Transportation (Non-Medical): No  Physical Activity: Inactive   Days of Exercise per Week: 0 days   Minutes of Exercise per Session: 0 min  Stress: No Stress Concern Present   Feeling of Stress : Not at all  Social  Connections: Socially Isolated   Frequency of Communication with Friends and Family: Once a week   Frequency of Social Gatherings with Friends and Family: Never   Attends Religious Services: Never   Database administrator or Organizations: No   Attends Banker Meetings: Never   Marital Status: Married    There were no vitals filed for this visit. There is no height or weight on file to calculate BMI.  Physical Exam  ASSESSMENT AND PLAN:   There are no diagnoses linked to this encounter.  No orders of the defined types were placed in this encounter.   No problem-specific Assessment & Plan notes found for this encounter.   No follow-ups on file.   [name]. [place], MD  [facility]. [place] office.

## 2021-12-18 ENCOUNTER — Ambulatory Visit: Payer: Medicare Other | Admitting: Family Medicine

## 2021-12-19 NOTE — Progress Notes (Signed)
HPI: [name] is a 76 y.o. female, who is here today to follow on recent visit. She was last seen on 11/14/2021 for hospital follow up. Anxiety and insomnia:She is on Trazodone 100 mg and Lorazepam 2 mg at bedtime. She has tried Doxepin and did not help. She has been on Lorazepam for many years. In general Trazodone and Lorazepam 2 mg at bedtime help her to get a better sleep but she still wakes up a few times through the night, mainly because nocturia.  Last visit she resumed Paroxetine 20 mg, which she is not sure if it is helping. No side effects reported.  B12 deficiency: She is on B12 1000 mcg q 4 weeks. She has felt better since she started B12 injection.  Back pain has improved, getting epidural injection, last one Monday.  Lab Results  Component Value Date   VITAMINB12 191 (L) 11/14/2021   Last seen by urologist 2 weeks ago and follow annually. She is on Flomax 0.4 mg daily. Negative for dysuria,gross hematuria,foam in urine.  She is taking KLUR 20 meq 1.5 tab bid.  Lab Results  Component Value Date   CREATININE 1.16 12/04/2021   BUN 13 12/04/2021   NA 138 12/04/2021   K 3.6 12/04/2021   CL 98 12/04/2021   CO2 35 (H) 12/04/2021  COPD: Frequent exacerbations that have required hospitalization. Symptoms have been stable since last hospital discharged, 11/07/21. She is on .Pulmicort neb treatments twice daily and Trelegy Ellipta 200-62.5-25 mcg 1 puff daily Duoneb tid prn. Nocturnal supplemental O2 2 LMP. Negative for fever or chills.  Concerned about wt gain. Reporting 5 Lb wt gain in the past 3 days. She took an extra furosemide Monday and wt has been stable. She has not noted changes in DOE,orthopnea,or PND. Negative for LE edema. She is on Furosemide 40 mg bid. Last Echo 08/02/21: LVEF 55-60% with grade II diastolic dysfunction. She is trying to limit fluid intake and following low salt diet.  Since her last visit she saw nephrologist and according  to pt,annual follow up was recommended.  Hypothyroidism on Levothyroxine 112 mcg daily. TSH has been on the lower normal range for the past 1-2 years.  Lab Results  Component Value Date   TSH 0.44 07/19/2021   Review of Systems  Respiratory:  Negative for cough and wheezing.   Cardiovascular:  Negative for chest pain and palpitations.  Gastrointestinal:  Negative for abdominal pain, nausea and vomiting.  Endocrine: Negative for cold intolerance and heat intolerance.  Neurological:  Negative for syncope, weakness and headaches.  Psychiatric/Behavioral:  Negative for confusion. The patient is nervous/anxious.   Rest see pertinent positives and negatives per HPI.  Current Outpatient Medications on File Prior to Visit  Medication Sig Dispense Refill   albuterol (VENTOLIN HFA) 108 (90 Base) MCG/ACT inhaler TAKE 2 PUFFS BY MOUTH EVERY 6 HOURS AS NEEDED FOR WHEEZE OR SHORTNESS OF BREATH 8.5 each 3   aspirin EC 81 MG tablet Take 1 tablet (81 mg total) by mouth daily. 30 tablet    atorvastatin (LIPITOR) 80 MG tablet Take 1 tablet (80 mg total) by mouth daily. 90 tablet 2   budesonide (PULMICORT) 0.5 MG/2ML nebulizer solution Take 2 mLs (0.5 mg total) by nebulization 2 (two) times daily. 20 mL 12   cilostazol (PLETAL) 100 MG tablet Take 1 tablet (100 mg total) by mouth 2 (two) times daily. 180 tablet 1   clopidogrel (PLAVIX) 75 MG tablet TAKE 1 TABLET BY MOUTH EVERY  DAY 90 tablet 3   DUPIXENT 300 MG/2ML SOPN Inject 300 mg into the skin every 14 (fourteen) days. Every other Friday     ezetimibe (ZETIA) 10 MG tablet Take 1 tablet (10 mg total) by mouth daily. 90 tablet 3   ferrous sulfate 325 (65 FE) MG tablet TAKE 1 TABLET BY MOUTH EVERY DAY WITH BREAKFAST 90 tablet 1   fluticasone (FLONASE) 50 MCG/ACT nasal spray SPRAY 1 SPRAY INTO EACH NOSTRIL TWICE A DAY 48 mL 1   Fluticasone-Umeclidin-Vilant (TRELEGY ELLIPTA) 200-62.5-25 MCG/INH AEPB Inhale 1 puff into the lungs daily. 60 each 3   furosemide  (LASIX) 40 MG tablet TAKE 1 TABLET BY MOUTH TWICE A DAY 180 tablet 1   gabapentin (NEURONTIN) 300 MG capsule Take 300 mg by mouth 4 (four) times daily as needed (for nerve pain).     HYDROcodone-acetaminophen (NORCO/VICODIN) 5-325 MG tablet Take 1 tablet by mouth at bedtime.     hydrOXYzine (ATARAX/VISTARIL) 25 MG tablet Take 25 mg by mouth daily as needed for itching.     ipratropium-albuterol (DUONEB) 0.5-2.5 (3) MG/3ML SOLN Inhale 3 mLs into the lungs as directed. TID for 1 week and then use as needed Q6 hours. 360 mL 4   LORazepam (ATIVAN) 2 MG tablet TAKE 1 TABLET BY MOUTH AT BEDTIME. 30 tablet 3   montelukast (SINGULAIR) 10 MG tablet TAKE 1 TABLET BY MOUTH EVERYDAY AT BEDTIME 90 tablet 1   nystatin (MYCOSTATIN) 100000 UNIT/ML suspension Take 5 mLs (500,000 Units total) by mouth 3 (three) times daily for 21 days. 480 mL 0   pantoprazole (PROTONIX) 40 MG tablet Take 1 tablet (40 mg total) by mouth daily. 30 tablet 0   PARoxetine (PAXIL) 20 MG tablet Take 1 tablet (20 mg total) by mouth daily. 90 tablet 2   potassium chloride SA (KLOR-CON M) 20 MEQ tablet Take 1 tablet (20 mEq total) by mouth daily. 30 tablet 3   predniSONE (DELTASONE) 10 MG tablet Take 50mg  daily for 3days,Take 40mg  daily for 3days,Take 30mg  daily for 3days,Take 20mg  daily for 3days,Take 10mg  daily for 3days, then stop 45 tablet 0   tamsulosin (FLOMAX) 0.4 MG CAPS capsule TAKE 1 CAPSULE BY MOUTH EVERY DAY 90 capsule 0   traZODone (DESYREL) 100 MG tablet TAKE 1 TABLET BY MOUTH EVERYDAY AT BEDTIME 90 tablet 1   Current Facility-Administered Medications on File Prior to Visit  Medication Dose Route Frequency Provider Last Rate Last Admin   cyanocobalamin ((VITAMIN B-12)) injection 1,000 mcg  1,000 mcg Intramuscular Weekly [place], Margarine Grosshans G, MD   1,000 mcg at 12/20/21 1500    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)    05/ 2018  chronic non-occlusive DVT RLE   COPD, frequent exacerbations (HCC)    03-06-2018 per pt last exacerbation 12/ 2018   Coronary artery disease    cardiologist-  dr Shirlee Latch-- 03-11-2018 er cath , 80% stenosis in the ostial second diagonal   Dyspnea on minimal exertion    Emphysema/COPD (HCC)    CAT score- 17   Family history of breast cancer    Family history of colon cancer    Family history of melanoma    Family history of ovarian cancer    Family history of pancreatic cancer    Fibromyalgia 1995  GERD (gastroesophageal reflux disease)    Hiatal hernia    History of breast cancer    History of diverticulitis of colon    2002  s/p  sigmoid colectomy   History of multiple pulmonary nodules    hx RUL nodules x2  s/p right VATS w/ wedge resection 09-11-2005 and 06-14-2011  both  necrotizing granulomatous inflammation w/ cystic area of necrosis & focal calcification   History of right breast cancer oncologist-  dr [name]-- no recurrence   dx 2010--  IDC, Stage IA , ER/PR+,  (pT1c pN0) 11-09-2008 right lumpectomy;  12-18-2008 right simple mastectomy for DCIS margins;  completed chemotherapy 2010 (no radiation) and completed antiestrogen therapy   Hx of colonic polyps    Dr. 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   Nocturia    OA (osteoarthritis)    thumbs   On supplemental oxygen therapy    03-06-2018 per pt uses only at night,  checks O2 sats at home,  stated am sat 89% after moving around average 93-94% with RA   Osteoporosis    Peripheral arterial occlusive disease (HCC) vascular-- dr chen/ dr Randie Heinz   proximal right SFA severe focal stenosis with collaterals from the PFA/  04/ 2012 occluded celiac and SMA arteries with distal reconstitution w/ patent IMA   Peripheral vascular disease (HCC)    chronic DVT  RLE,  mesenteric vascular disease   Right lower lobe pulmonary nodule    Chest CT 08-29-2017   Stage 3 severe COPD by GOLD classification (HCC) hx frequent exacerbations--   pulmologist-  dr Inocente Salles--  per lov note , dated 12-20-2017, oxyogen 2L is prescribed for use with exertion (but pt only uses mostly at night), O2 sats on RA run the 80s , this day sat 86% RA and with 2L O2 sat 92%   Varicose veins of leg with swelling    varicose vein surgery - Dr. Guss Bunde   Wears glasses    Wears hearing aid in both ears    Allergies  Allergen Reactions   Sinequan [Doxepin] Other (See Comments)    Kept her awake    Sulfa Antibiotics Swelling   Sulfur     Other reaction(s): Unknown   Social History   Socioeconomic History   Marital status: Married    Spouse name: Not on file   Number of children: 3   Years of education: 12   Highest education level: Not on file  Occupational History   Occupation: Magazine features editor: RETIRED    Comment: retired  Tobacco Use   Smoking status: Former    Packs/day: 0.75    Years: 50.00    Pack years: 37.50    Types: Cigarettes    Quit date: 05/18/2016    Years since quitting: 5.6   Smokeless tobacco: Never  Vaping Use   Vaping Use: Never used  Substance and Sexual Activity   Alcohol use: Yes    Alcohol/week: 10.0 standard drinks    Types: 10 Cans of beer per week   Drug use: No   Sexual activity: Not on file  Other Topics Concern   Not on file  Social History Narrative   HSG, beauty school. Married '69 -7 yrs/widowed; married '79 - 43yr/divorced; married '87.  2 sons - '70, '74, 1 srtep-son; 1 grandchild. 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  SO - good health.  NO history of abuse.  ACP - full code and all heroic measures.    Social Determinants of Health   Financial Resource Strain: Not on file  Food Insecurity: Not on file  Transportation Needs: Not on file  Physical Activity: Not on file  Stress: Not on file  Social Connections: Not  on file   Vitals:   12/20/21 1410  BP: 136/80  Pulse: 90  Resp: 16  SpO2: 92%   Wt Readings from Last 3 Encounters:  12/20/21 140 lb (63.5 kg)  11/30/21 137 lb 6.4 oz (62.3 kg)  11/14/21 135 lb 3.2 oz (61.3 kg)   Body mass index is 22.6 kg/m.  Physical Exam Vitals and nursing note reviewed.  Constitutional:      General: She is not in acute distress.    Appearance: She is well-developed.  HENT:     Head: Normocephalic and atraumatic.     Mouth/Throat:     Mouth: Mucous membranes are moist.     Pharynx: Oropharynx is clear.  Eyes:     Conjunctiva/sclera: Conjunctivae normal.  Neck:     Vascular: No JVD.  Cardiovascular:     Rate and Rhythm: Normal rate and regular rhythm.     Pulses:          Dorsalis pedis pulses are 2+ on the right side and 2+ on the left side.     Heart sounds: No murmur heard. Pulmonary:     Effort: Pulmonary effort is normal. No respiratory distress.     Breath sounds: Normal breath sounds.  Abdominal:     Palpations: Abdomen is soft. There is no hepatomegaly or mass.     Tenderness: There is no abdominal tenderness.  Lymphadenopathy:     Cervical: No cervical adenopathy.  Skin:    General: Skin is warm.     Findings: No erythema or rash.  Neurological:     General: No focal deficit present.     Mental Status: She is alert and oriented to person, place, and time.     Cranial Nerves: No cranial nerve deficit.     Gait: Gait normal.  Psychiatric:        Mood and Affect: Mood is anxious.     Comments: Well groomed, good eye contact.   ASSESSMENT AND PLAN:  [name] was seen today for follow-up.  Diagnoses and all orders for this visit: Orders Placed This Encounter  Procedures   Basic metabolic panel   Brain Natriuretic Peptide   Lab Results  Component Value Date   CREATININE 0.79 12/20/2021   BUN 22 12/20/2021   NA 136 12/20/2021   K 4.2 12/20/2021   CL 99 12/20/2021   CO2 28 12/20/2021   Insomnia, unspecified  type Stable. Aggravated by some of her chronic medical contentions. Continue Trazodone 100 mg at bedtime. Good sleep hygiene also recommended.  HFrEF (heart failure with reduced ejection fraction) (HCC) Reporting 5 Lb wt gain in the past week, no other symptoms suggestive of acute exacerbation. She is on Furosemide 40 mg bid, we discussed some side effects. Monitor for new symptoms. Continue fluid restriction,low salt diet,   Hypokalemia Continue KLOR 20 meq 1.5 tab bid. We discussed some side effects of diuretics. Further recommendations according to BMP results.  Anxiety disorder, unspecified type Stable. She is not sure of Paroxetine is helping, she resumed it about 4-5 weeks ago. Recommend completing 8 weeks at least and if still not significant differences we can  start weaning off medication. Continue Lorazepam 2 mg at bedtime.  Hypothyroidism, unspecified type For the past 1-2 years TSH has been on lower normal range. She agrees with decreasing Levothyroxine dose from 1 tab to 1/2 tab Tuesdays and Thursdays, rest of days continue 1 tab. TSH in 6-8 weeks.  -     levothyroxine (SYNTHROID) 112 MCG tablet; 0.5 tab Tuesday and Thursday, continue 1 tab Monday,Wednesday,Friday,Sat,and Sunday.  Return in about 4 months (around 04/21/2022).  Jamareon Shimel G. [place], MD  [facility]. [place] office.

## 2021-12-20 ENCOUNTER — Ambulatory Visit (INDEPENDENT_AMBULATORY_CARE_PROVIDER_SITE_OTHER): Payer: Medicare Other | Admitting: Family Medicine

## 2021-12-20 ENCOUNTER — Encounter: Payer: Self-pay | Admitting: Family Medicine

## 2021-12-20 VITALS — BP 136/80 | HR 90 | Resp 16 | Ht 66.0 in | Wt 140.0 lb

## 2021-12-20 DIAGNOSIS — G4700 Insomnia, unspecified: Secondary | ICD-10-CM | POA: Diagnosis not present

## 2021-12-20 DIAGNOSIS — F419 Anxiety disorder, unspecified: Secondary | ICD-10-CM | POA: Diagnosis not present

## 2021-12-20 DIAGNOSIS — E038 Other specified hypothyroidism: Secondary | ICD-10-CM

## 2021-12-20 DIAGNOSIS — E876 Hypokalemia: Secondary | ICD-10-CM | POA: Diagnosis not present

## 2021-12-20 DIAGNOSIS — I5020 Unspecified systolic (congestive) heart failure: Secondary | ICD-10-CM

## 2021-12-20 DIAGNOSIS — E538 Deficiency of other specified B group vitamins: Secondary | ICD-10-CM | POA: Diagnosis not present

## 2021-12-20 DIAGNOSIS — E039 Hypothyroidism, unspecified: Secondary | ICD-10-CM

## 2021-12-20 LAB — BASIC METABOLIC PANEL
BUN: 22 mg/dL (ref 6–23)
CO2: 28 mEq/L (ref 19–32)
Calcium: 10 mg/dL (ref 8.4–10.5)
Chloride: 99 mEq/L (ref 96–112)
Creatinine, Ser: 0.79 mg/dL (ref 0.40–1.20)
GFR: 72.31 mL/min (ref >60.00)
Glucose, Bld: 96 mg/dL (ref 70–99)
Potassium: 4.2 mEq/L (ref 3.5–5.1)
Sodium: 136 mEq/L (ref 135–145)

## 2021-12-20 LAB — BRAIN NATRIURETIC PEPTIDE: Pro B Natriuretic peptide (BNP): 111 pg/mL — ABNORMAL HIGH (ref 0.0–100.0)

## 2021-12-20 MED ORDER — LEVOTHYROXINE SODIUM 112 MCG PO TABS: ORAL_TABLET | ORAL | 2 refills | Status: DC

## 2021-12-20 NOTE — Patient Instructions (Addendum)
A few things to remember from today's visit: ? ?HFrEF (heart failure with reduced ejection fraction) ([place]) - Plan: Basic metabolic panel, Brain Natriuretic Peptide ? ?Hypokalemia - Plan: Basic metabolic panel ? ?Insomnia, unspecified type ? ?Anxiety disorder, unspecified type ? ?Other specified hypothyroidism ? ?Hypothyroidism, unspecified type - Plan: levothyroxine (SYNTHROID) 112 MCG tablet ? ?If you need refills please call your pharmacy. ?Do not use My Chart to request refills or for acute issues that need immediate attention. ?  ?Continue paroxetine 20 mg for 4 more weeks to complete 8-9 weeks of treatment and if you do not notice any difference in mood or anxiety we can start weaning it off. ? ?Levothyroxine to decrease to 1/2 tab Tuesdays and thursdays, 1 tab to continue [employer]. ? ?Pharmacy needs to send request for Flomax to your urologist next time. ? ?Please be sure medication list is accurate. ?If a new problem present, please set up appointment sooner than planned today. ? ?Continue low salt diet. ?Depending of lab results I will let you know if we have to increase Furosemide for a few days. ? ? ? ? ? ?

## 2021-12-23 ENCOUNTER — Other Ambulatory Visit: Payer: Self-pay | Admitting: Internal Medicine

## 2021-12-25 NOTE — Telephone Encounter (Signed)
Trelegy refilled ?

## 2021-12-26 ENCOUNTER — Other Ambulatory Visit: Payer: Self-pay

## 2021-12-26 ENCOUNTER — Other Ambulatory Visit: Payer: Medicare Other

## 2021-12-26 DIAGNOSIS — I10 Essential (primary) hypertension: Secondary | ICD-10-CM

## 2021-12-26 DIAGNOSIS — Z79899 Other long term (current) drug therapy: Secondary | ICD-10-CM

## 2021-12-26 DIAGNOSIS — I2510 Atherosclerotic heart disease of native coronary artery without angina pectoris: Secondary | ICD-10-CM

## 2021-12-26 DIAGNOSIS — E785 Hyperlipidemia, unspecified: Secondary | ICD-10-CM

## 2021-12-26 LAB — LIPID PANEL
Chol/HDL Ratio: 1.6 ratio (ref 0.0–4.4)
Cholesterol, Total: 145 mg/dL (ref 100–199)
HDL: 93 mg/dL (ref >39)
LDL Chol Calc (NIH): 40 mg/dL (ref 0–99)
Triglycerides: 57 mg/dL (ref 0–149)
VLDL Cholesterol Cal: 12 mg/dL (ref 5–40)

## 2021-12-27 ENCOUNTER — Ambulatory Visit (INDEPENDENT_AMBULATORY_CARE_PROVIDER_SITE_OTHER): Payer: Medicare Other

## 2021-12-27 ENCOUNTER — Ambulatory Visit: Payer: Medicare Other

## 2021-12-27 DIAGNOSIS — E538 Deficiency of other specified B group vitamins: Secondary | ICD-10-CM

## 2021-12-27 NOTE — Telephone Encounter (Signed)
Contacted patient on preferred number listed in notes for scheduled AWV. Patient stated unable to completed visit today d/t another appointment.Okay to reschedule ?

## 2021-12-27 NOTE — Progress Notes (Signed)
Pt here for monthly B12 injection per Dr. [place] ? ?B12 1081mcg given IM, and pt tolerated injection well. ? ?Next B12 injection scheduled for 1 month. ? ?

## 2022-01-02 ENCOUNTER — Other Ambulatory Visit: Payer: Self-pay | Admitting: Family Medicine

## 2022-01-02 DIAGNOSIS — B370 Candidal stomatitis: Secondary | ICD-10-CM

## 2022-01-08 ENCOUNTER — Ambulatory Visit (INDEPENDENT_AMBULATORY_CARE_PROVIDER_SITE_OTHER): Payer: Medicare Other

## 2022-01-08 VITALS — Ht 65.0 in | Wt 131.0 lb

## 2022-01-08 DIAGNOSIS — Z0000 Encounter for general adult medical examination without abnormal findings: Secondary | ICD-10-CM

## 2022-01-08 DIAGNOSIS — Z87891 Personal history of nicotine dependence: Secondary | ICD-10-CM | POA: Diagnosis not present

## 2022-01-08 DIAGNOSIS — Z122 Encounter for screening for malignant neoplasm of respiratory organs: Secondary | ICD-10-CM | POA: Diagnosis not present

## 2022-01-08 NOTE — Addendum Note (Signed)
Addended by: Kellie Simmering on: 01/08/2022 04:37 PM ? ? Modules accepted: Orders ? ?

## 2022-01-08 NOTE — Progress Notes (Signed)
I connected with [name] today by telephone and verified that I am speaking with the correct person using two identifiers. Location patient: home Location provider: work Persons participating in the virtual visit: [name], Christy Ponder LPN.   I discussed the limitations, risks, security and privacy concerns of performing an evaluation and management service by telephone and the availability of in person appointments. I also discussed with the patient that there may be a patient responsible charge related to this service. The patient expressed understanding and verbally consented to this telephonic visit.    Interactive audio and video telecommunications were attempted between this provider and patient, however failed, due to patient having technical difficulties OR patient did not have access to video capability.  We continued and completed visit with audio only.     Vital signs may be patient reported or missing.  Subjective:   [name] is a 76 y.o. female who presents for Medicare Annual (Subsequent) preventive examination.  Review of Systems     Cardiac Risk Factors include: advanced age (>52men, >65 women);dyslipidemia;hypertension     Objective:    Today's Vitals   01/08/22 1357 01/08/22 1358  Weight: 131 lb (59.4 kg)   Height: 5\' 5"  (1.651 m)   PainSc:  3    Body mass index is 21.8 kg/m.     01/08/2022    2:09 PM 11/01/2021   10:31 PM 09/05/2021   10:35 AM 07/31/2021    3:45 PM 07/31/2021   10:23 AM 03/09/2021    9:50 PM 12/21/2020    1:14 PM  Advanced Directives  Does Patient Have a Medical Advance Directive? Yes No No Yes Yes No Yes  Type of Estate agent of [place];Living will   Healthcare Power of [place];Living will Healthcare Power of [place];Living will  Healthcare Power of [place];Living will  Does patient want to make changes to medical advance directive?    No - Patient declined     Copy of Healthcare Power of Attorney  in Chart? No - copy requested   No - copy requested   No - copy requested  Would patient like information on creating a medical advance directive?  No - Patient declined    No - Patient declined     Current Medications (verified) Outpatient Encounter Medications as of 01/08/2022  Medication Sig   albuterol (VENTOLIN HFA) 108 (90 Base) MCG/ACT inhaler TAKE 2 PUFFS BY MOUTH EVERY 6 HOURS AS NEEDED FOR WHEEZE OR SHORTNESS OF BREATH   aspirin EC 81 MG tablet Take 1 tablet (81 mg total) by mouth daily.   atorvastatin (LIPITOR) 80 MG tablet Take 1 tablet (80 mg total) by mouth daily.   budesonide (PULMICORT) 0.5 MG/2ML nebulizer solution Take 2 mLs (0.5 mg total) by nebulization 2 (two) times daily.   cilostazol (PLETAL) 100 MG tablet Take 1 tablet (100 mg total) by mouth 2 (two) times daily.   clopidogrel (PLAVIX) 75 MG tablet TAKE 1 TABLET BY MOUTH EVERY DAY   DUPIXENT 300 MG/2ML SOPN Inject 300 mg into the skin every 14 (fourteen) days. Every other Friday   ezetimibe (ZETIA) 10 MG tablet Take 1 tablet (10 mg total) by mouth daily.   ferrous sulfate 325 (65 FE) MG tablet TAKE 1 TABLET BY MOUTH EVERY DAY WITH BREAKFAST   fluticasone (FLONASE) 50 MCG/ACT nasal spray SPRAY 1 SPRAY INTO EACH NOSTRIL TWICE A DAY   Fluticasone-Umeclidin-Vilant (TRELEGY ELLIPTA) 200-62.5-25 MCG/ACT AEPB TAKE 1 PUFF BY MOUTH EVERY DAY  furosemide (LASIX) 40 MG tablet TAKE 1 TABLET BY MOUTH TWICE A DAY   gabapentin (NEURONTIN) 300 MG capsule Take 300 mg by mouth 4 (four) times daily as needed (for nerve pain).   HYDROcodone-acetaminophen (NORCO/VICODIN) 5-325 MG tablet Take 1 tablet by mouth at bedtime.   hydrOXYzine (ATARAX/VISTARIL) 25 MG tablet Take 25 mg by mouth daily as needed for itching.   ipratropium-albuterol (DUONEB) 0.5-2.5 (3) MG/3ML SOLN Inhale 3 mLs into the lungs as directed. TID for 1 week and then use as needed Q6 hours.   levothyroxine (SYNTHROID) 112 MCG tablet 0.5 tab Tuesday and Thursday, continue 1  tab Monday,Wednesday,Friday,Sat,and Sunday.   LORazepam (ATIVAN) 2 MG tablet TAKE 1 TABLET BY MOUTH AT BEDTIME.   montelukast (SINGULAIR) 10 MG tablet TAKE 1 TABLET BY MOUTH EVERYDAY AT BEDTIME   nystatin (MYCOSTATIN) 100000 UNIT/ML suspension Take 5 mLs (500,000 Units total) by mouth 3 (three) times daily for 14 days.   pantoprazole (PROTONIX) 40 MG tablet Take 1 tablet (40 mg total) by mouth daily.   PARoxetine (PAXIL) 20 MG tablet Take 1 tablet (20 mg total) by mouth daily.   potassium chloride SA (KLOR-CON M) 20 MEQ tablet Take 1 tablet (20 mEq total) by mouth daily.   tamsulosin (FLOMAX) 0.4 MG CAPS capsule TAKE 1 CAPSULE BY MOUTH EVERY DAY   traZODone (DESYREL) 100 MG tablet TAKE 1 TABLET BY MOUTH EVERYDAY AT BEDTIME   predniSONE (DELTASONE) 10 MG tablet Take 50mg  daily for 3days,Take 40mg  daily for 3days,Take 30mg  daily for 3days,Take 20mg  daily for 3days,Take 10mg  daily for 3days, then stop (Patient not taking: Reported on 01/08/2022)   No facility-administered encounter medications on file as of 01/08/2022.    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)    05/ 2018  chronic non-occlusive DVT RLE   COPD, frequent exacerbations (HCC)    03-06-2018 per pt last exacerbation 12/ 2018   Coronary artery disease    cardiologist-  dr Shirlee Latch-- 03-11-2018 er cath , 80% stenosis in the ostial second diagonal   Dyspnea on minimal exertion    Emphysema/COPD (HCC)    CAT score- 17   Family history of breast cancer    Family history of colon cancer    Family history of melanoma    Family history of ovarian cancer    Family history of pancreatic cancer    Fibromyalgia 1995   GERD (gastroesophageal reflux disease)    Hiatal hernia    History of  breast cancer    History of diverticulitis of colon    2002  s/p  sigmoid colectomy   History of multiple pulmonary nodules    hx RUL nodules x2  s/p right VATS w/ wedge resection 09-11-2005 and 06-14-2011  both  necrotizing granulomatous inflammation w/ cystic area of necrosis & focal calcification   History of right breast cancer oncologist-  dr [name]-- no recurrence   dx 2010--  IDC, Stage IA , ER/PR+,  (pT1c pN0) 11-09-2008 right lumpectomy;  12-18-2008 right simple mastectomy for DCIS margins;  completed chemotherapy 2010 (no radiation) and completed antiestrogen therapy   Hx of colonic polyps    Dr. 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   Nocturia    OA (osteoarthritis)    thumbs   On supplemental oxygen therapy    03-06-2018 per pt uses only at night,  checks O2 sats at home,  stated am sat 89% after moving around average 93-94% with RA   Osteoporosis    Peripheral arterial occlusive disease (HCC) vascular-- dr chen/ dr Randie Heinz   proximal right SFA severe focal stenosis with collaterals from the PFA/  04/ 2012 occluded celiac and SMA arteries with distal reconstitution w/ patent IMA   Peripheral vascular disease (HCC)    chronic DVT RLE,  mesenteric vascular disease   Right lower lobe pulmonary nodule    Chest CT 08-29-2017   Stage 3 severe COPD by GOLD classification (HCC) hx frequent exacerbations--   pulmologist-  dr Inocente Salles--  per lov note , dated 12-20-2017, oxyogen 2L is prescribed for use with exertion (but pt only uses mostly at night), O2 sats on RA run the 80s , this day sat 86% RA and with 2L O2 sat 92%   Varicose veins of leg with swelling    varicose vein surgery - Dr. Guss Bunde   Wears glasses    Wears hearing aid in both ears    Past Surgical History:  Procedure Laterality Date   ABDOMINAL AORTOGRAM N/A 07/11/2020   Procedure: ABDOMINAL AORTOGRAM;   Surgeon: Maeola Harman, MD;  Location: [facility] INVASIVE CV LAB;  Service: Cardiovascular;  Laterality: N/A;   AORTIC ARCH ANGIOGRAPHY N/A 07/11/2020   Procedure: AORTIC ARCH ANGIOGRAPHY;  Surgeon: Maeola Harman, MD;  Location: [facility] INVASIVE CV LAB;  Service: Cardiovascular;  Laterality: N/A;   BREAST EXCISIONAL BIOPSY Left 10/15/2008   BREAST LUMPECTOMY W/ NEEDLE LOCALIZATION Right 11-09-2008    dr cornett   [facility]   CARDIAC CATHETERIZATION  03-12-2011   dr Shirlee Latch   70-80% ostial stenosis in small second diagonal (appears to small for intervention),  dLAD 40-50%,  minimal luminal irregulartieis involoving LCFx and RCA,  LVEF 55%   CATARACT EXTRACTION W/ INTRAOCULAR LENS  IMPLANT, BILATERAL  2011   CYSTOSCOPY N/A 03/11/2018   Procedure: CYSTOSCOPY WITH INSTILLATION OF POST OPERATIVE EPIRUBICIN;  Surgeon: Jerilee Field, MD;  Location: [facility] ORS;  Service: Urology;  Laterality: N/A;   LEFT HEART CATH AND CORONARY ANGIOGRAPHY N/A 05/25/2020   Procedure: LEFT HEART CATH AND CORONARY ANGIOGRAPHY;  Surgeon: Lennette Bihari, MD;  Location: [facility] INVASIVE CV LAB;  Service: Cardiovascular;  Laterality: N/A;   MASTECTOMY Right    PARTIAL COLECTOMY  2002   sigmoid and appectomy (diverticulitis)   PARTIAL MASTECTOMY WITH NEEDLE LOCALIZATION Left 02/23/2013   Procedure:  LEFT PARTIAL MASTECTOMY WITH NEEDLE LOCALIZATION;  Surgeon: [name], MD;  Location: [facility];  Service: General;  Laterality: Left;   PERIPHERAL VASCULAR INTERVENTION  07/11/2020   Procedure: PERIPHERAL VASCULAR INTERVENTION;  Surgeon: Maeola Harman, MD;  Location: [facility] INVASIVE CV LAB;  Service: Cardiovascular;;   RECONSTRUCTION BREAST W/ LATISSIMUS DORSI FLAP Right 05-30-2009   dr Odis Luster  [facility]   REDUCTION MAMMAPLASTY Left    SIMPLE MASTECTOMY Right 12-28-2008    dr cornett  [facility]   TRANSTHORACIC ECHOCARDIOGRAM  07-09-2016  dr Shirlee Latch   ef 55-60%, grade 1 diastoic dysfunction/  mild TR    TRANSURETHRAL RESECTION OF BLADDER TUMOR N/A 03/11/2018   Procedure: TRANSURETHRAL RESECTION OF BLADDER TUMOR (TURBT) 2-5cm;  Surgeon: Jerilee Field, MD;  Location: [facility];  Service:  Urology;  Laterality: N/A;   UPPER EXTREMITY ANGIOGRAPHY Left 07/11/2020   Procedure: Upper Extremity Angiography;  Surgeon: Maeola Harman, MD;  Location: [facility] INVASIVE CV LAB;  Service: Cardiovascular;  Laterality: Left;   VAGINAL HYSTERECTOMY  1973   VIDEO ASSISTED THORACOSCOPY (VATS)/WEDGE RESECTION Right 09-11-2005  &  06-14-2011   dr hendrickso  [facility]   both RUL   Family History  Problem Relation Age of Onset   Colon cancer Mother        colon   COPD Mother        brown lung   Hypertension Mother    Diabetes Mother    Emphysema Mother    Coronary artery disease Father    Heart attack Father    Sudden death Father    Heart disease Father    Cancer Sister        throat   Hypertension Sister    Coronary artery disease Brother    Heart attack Brother        early 41s   Throat cancer Sister    Ovarian cancer Sister        fallopian tube cancer in her 28s   Melanoma Sister    Hypertension Sister    Pancreatic cancer Sister    Melanoma Niece        dx in her 41s   Breast cancer Niece 55   Social History   Socioeconomic History   Marital status: Married    Spouse name: Not on file   Number of children: 3   Years of education: 12   Highest education level: Not on file  Occupational History   Occupation: Magazine features editor: RETIRED    Comment: retired  Tobacco Use   Smoking status: Former    Packs/day: 0.75    Years: 50.00    Pack years: 37.50    Types: Cigarettes    Quit date: 05/18/2016    Years since quitting: 5.6   Smokeless tobacco: Never  Vaping Use   Vaping Use: Never used  Substance and Sexual Activity   Alcohol use: Yes    Alcohol/week: 10.0 standard drinks    Types: 10 Cans of beer per week   Drug use: No   Sexual activity: Not on file  Other Topics  Concern   Not on file  Social History Narrative   HSG, beauty school. Married '69 -7 yrs/widowed; married '79 - 87yr/divorced; married '87.  2 sons - '70, '74, 1 srtep-son; 1 grandchild. 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 SO - good health.  NO history of abuse.  ACP - full code and all heroic measures.    Social Determinants of Health   Financial Resource Strain: Not on file  Food Insecurity: Not on file  Transportation Needs: Not on file  Physical Activity: Not on file  Stress: Not on file  Social Connections: Not on file    Tobacco Counseling Counseling given: Not Answered   Clinical Intake:  Pre-visit preparation completed: Yes  Pain : 0-10 Pain Score: 3  Pain Type: Chronic pain Pain Location: Back Pain Orientation: Lower Pain Descriptors / Indicators: Aching Pain Onset: More than a month ago Pain Frequency: Constant     Nutritional Status: BMI of 19-24  Normal Nutritional Risks: Nausea/ vomitting/ diarrhea (has constant diarrhea) Diabetes: No  How often do you need to have someone help you when you read instructions, pamphlets, or other written materials from your doctor or pharmacy?: 1 - Never What  is the last grade level you completed in school?: 12th grade  Diabetic? no  Interpreter Needed?: No  Information entered by :: NAllen LPN   Activities of Daily Living    01/08/2022    2:10 PM 11/06/2021    3:00 PM  In your present state of health, do you have any difficulty performing the following activities:  Hearing? 1   Comment has hearing aides   Vision? 0   Difficulty concentrating or making decisions? 0   Walking or climbing stairs? 0   Dressing or bathing? 0   Doing errands, shopping? 0 0  Preparing Food and eating ? N   Using the Toilet? N   In the past six months, have you accidently leaked urine? N   Do you have problems with loss of bowel control? N   Managing your Medications? N   Managing your Finances? N   Housekeeping or managing your  Housekeeping? N     Patient Care Team: [place], [name], MD as PCP - General (Family Medicine) Quintella Reichert, MD as PCP - Cardiology (Cardiology) Loreli Slot, MD (Cardiothoracic Surgery) Laurey Morale, MD (Cardiology) Axel Filler Larna Daughters, NP as Nurse Practitioner (Hematology and Oncology)  Indicate any recent Medical Services you may have received from other than [facility] providers in the past year (date may be approximate).     Assessment:   This is a routine wellness examination for Jakela.  Hearing/Vision screen Vision Screening - Comments:: No regular eye exams,  Dietary issues and exercise activities discussed: Current Exercise Habits: Home exercise routine, Type of exercise: Other - see comments (therapy exercises), Time (Minutes): 30, Frequency (Times/Week): 7, Weekly Exercise (Minutes/Week): 210   Goals Addressed             This Visit's Progress    Patient Stated       01/08/2022, wants to get well and be able to travel, work in the garden       Depression Screen    01/08/2022    2:10 PM 12/20/2021    2:15 PM 11/14/2021    1:57 PM 05/19/2021    2:48 PM 12/21/2020    1:13 PM 10/27/2018    2:07 AM 05/16/2018    1:57 PM  PHQ 2/9 Scores  PHQ - 2 Score 0 0 0 4 0 0 0  PHQ- 9 Score  8 3 11        Fall Risk    01/08/2022    2:09 PM 12/21/2020    1:15 PM 10/27/2018    2:07 AM 05/16/2018    1:57 PM 08/12/2015    1:07 PM  Fall Risk   Falls in the past year? 0 0 0 No No  Number falls in past yr:  0     Injury with Fall?  0     Risk for fall due to : Medication side effect Impaired vision     Follow up Falls evaluation completed;Education provided;Falls prevention discussed Falls prevention discussed       FALL RISK PREVENTION PERTAINING TO THE HOME:  Any stairs in or around the home? Yes  If so, are there any without handrails? No  Home free of loose throw rugs in walkways, pet beds, electrical cords, etc? Yes  Adequate lighting in your home to reduce risk  of falls? Yes   ASSISTIVE DEVICES UTILIZED TO PREVENT FALLS:  Life alert? No  Use of a cane, walker or w/c? No  Grab bars in the bathroom?  No  Shower chair or bench in shower? No  Elevated toilet seat or a handicapped toilet? Yes   TIMED UP AND GO:  Was the test performed? No .      Cognitive Function:        01/08/2022    2:11 PM  6CIT Screen  What Year? 4 points  What month? 0 points  What time? 0 points  Count back from 20 0 points  Months in reverse 2 points  Repeat phrase 8 points  Total Score 14 points    Immunizations Immunization History  Administered Date(s) Administered   Fluad Quad(high Dose 65+) 08/26/2019, 07/19/2021   Influenza, High Dose Seasonal PF 10/03/2017, 06/20/2018, 09/27/2019   Influenza,inj,Quad PF,6+ Mos 08/10/2014, 08/12/2015, 07/03/2016   PFIZER(Purple Top)SARS-COV-2 Vaccination 01/06/2020   Pneumococcal Conjugate-13 08/10/2014   Pneumococcal Polysaccharide-23 08/12/2015   Td 10/16/2007   Zoster, Live 11/09/2010    TDAP status: Due, Education has been provided regarding the importance of this vaccine. Advised may receive this vaccine at local pharmacy or Health Dept. Aware to provide a copy of the vaccination record if obtained from local pharmacy or Health Dept. Verbalized acceptance and understanding.  Flu Vaccine status: Up to date  Pneumococcal vaccine status: Up to date  Covid-19 vaccine status: Declined, Education has been provided regarding the importance of this vaccine but patient still declined. Advised may receive this vaccine at local pharmacy or Health Dept.or vaccine clinic. Aware to provide a copy of the vaccination record if obtained from local pharmacy or Health Dept. Verbalized acceptance and understanding.  Qualifies for Shingles Vaccine? Yes   Zostavax completed Yes   Shingrix Completed?: No.    Education has been provided regarding the importance of this vaccine. Patient has been advised to call insurance company  to determine out of pocket expense if they have not yet received this vaccine. Advised may also receive vaccine at local pharmacy or Health Dept. Verbalized acceptance and understanding.  Screening Tests Health Maintenance  Topic Date Due   Hepatitis C Screening  Never done   Zoster Vaccines- Shingrix (1 of 2) Never done   COVID-19 Vaccine (2 - Pfizer risk series) 01/27/2020   Pneumonia Vaccine 55+ Years old  Completed   INFLUENZA VACCINE  Completed   DEXA SCAN  Completed   HPV VACCINES  Aged Out   COLONOSCOPY (Pts 45-67yrs Insurance coverage will need to be confirmed)  Discontinued   TETANUS/TDAP  Discontinued    Health Maintenance  Health Maintenance Due  Topic Date Due   Hepatitis C Screening  Never done   Zoster Vaccines- Shingrix (1 of 2) Never done   COVID-19 Vaccine (2 - Pfizer risk series) 01/27/2020    Colorectal cancer screening: No longer required.   Mammogram status: patient to schedule  Bone Density status: Completed 02/22/2014.  Lung Cancer Screening: (Low Dose CT Chest recommended if Age 45-80 years, 30 pack-year currently smoking OR have quit w/in 15years.) does qualify.   Lung Cancer Screening Referral: ordered today  Additional Screening:  Hepatitis C Screening: does qualify;   Vision Screening: Recommended annual ophthalmology exams for early detection of glaucoma and other disorders of the eye. Is the patient up to date with their annual eye exam?  No  Who is the provider or what is the name of the office in which the patient attends annual eye exams? none If pt is not established with a provider, would they like to be referred to a provider to establish care? No .  Dental Screening: Recommended annual dental exams for proper oral hygiene  Community Resource Referral / Chronic Care Management: CRR required this visit?  No   CCM required this visit?  No      Plan:     I have personally reviewed and noted the following in the patient's chart:    Medical and social history Use of alcohol, tobacco or illicit drugs  Current medications and supplements including opioid prescriptions.  Functional ability and status Nutritional status Physical activity Advanced directives List of other physicians Hospitalizations, surgeries, and ER visits in previous 12 months Vitals Screenings to include cognitive, depression, and falls Referrals and appointments  In addition, I have reviewed and discussed with patient certain preventive protocols, quality metrics, and best practice recommendations. A written personalized care plan for preventive services as well as general preventive health recommendations were provided to patient.     Barb Merino, LPN   02/06/9562   Nurse Notes: none  Due to this being a virtual visit, the after visit summary with patients personalized plan was offered to patient via mail or my-chart. patient was mailed a copy of AVS.

## 2022-01-08 NOTE — Patient Instructions (Signed)
[name] , Thank you for taking time to come for your Medicare Wellness Visit. I appreciate your ongoing commitment to your health goals. Please review the following plan we discussed and let me know if I can assist you in the future.   Screening recommendations/referrals: Colonoscopy: not required Mammogram: patient to schedule Bone Density: completed 02/22/2014 Recommended yearly ophthalmology/optometry visit for glaucoma screening and checkup Recommended yearly dental visit for hygiene and checkup  Vaccinations: Influenza vaccine: completed 07/19/2021, due next flu season Pneumococcal vaccine: completed 08/12/2015 Tdap vaccine: due Shingles vaccine: discussed   Covid-19: 01/06/2020  Advanced directives: Please bring a copy of your POA (Power of Attorney) and/or Living Will to your next appointment.   Conditions/risks identified: none  Next appointment: Follow up in one year for your annual wellness visit    Preventive Care 65 Years and Older, Female Preventive care refers to lifestyle choices and visits with your health care provider that can promote health and wellness. What does preventive care include? A yearly physical exam. This is also called an annual well check. Dental exams once or twice a year. Routine eye exams. Ask your health care provider how often you should have your eyes checked. Personal lifestyle choices, including: Daily care of your teeth and gums. Regular physical activity. Eating a healthy diet. Avoiding tobacco and drug use. Limiting alcohol use. Practicing safe sex. Taking low-dose aspirin every day. Taking vitamin and mineral supplements as recommended by your health care provider. What happens during an annual well check? The services and screenings done by your health care provider during your annual well check will depend on your age, overall health, lifestyle risk factors, and family history of disease. Counseling  Your health care provider  may ask you questions about your: Alcohol use. Tobacco use. Drug use. Emotional well-being. Home and relationship well-being. Sexual activity. Eating habits. History of falls. Memory and ability to understand (cognition). Work and work Astronomer. Reproductive health. Screening  You may have the following tests or measurements: Height, weight, and BMI. Blood pressure. Lipid and cholesterol levels. These may be checked every 5 years, or more frequently if you are over 25 years old. Skin check. Lung cancer screening. You may have this screening every year starting at age 64 if you have a 30-pack-year history of smoking and currently smoke or have quit within the past 15 years. Fecal occult blood test (FOBT) of the stool. You may have this test every year starting at age 64. Flexible sigmoidoscopy or colonoscopy. You may have a sigmoidoscopy every 5 years or a colonoscopy every 10 years starting at age 46. Hepatitis C blood test. Hepatitis B blood test. Sexually transmitted disease (STD) testing. Diabetes screening. This is done by checking your blood sugar (glucose) after you have not eaten for a while (fasting). You may have this done every 1-3 years. Bone density scan. This is done to screen for osteoporosis. You may have this done starting at age 22. Mammogram. This may be done every 1-2 years. Talk to your health care provider about how often you should have regular mammograms. Talk with your health care provider about your test results, treatment options, and if necessary, the need for more tests. Vaccines  Your health care provider may recommend certain vaccines, such as: Influenza vaccine. This is recommended every year. Tetanus, diphtheria, and acellular pertussis (Tdap, Td) vaccine. You may need a Td booster every 10 years. Zoster vaccine. You may need this after age 97. Pneumococcal 13-valent conjugate (PCV13) vaccine. One dose  is recommended after age 56. Pneumococcal  polysaccharide (PPSV23) vaccine. One dose is recommended after age 25. Talk to your health care provider about which screenings and vaccines you need and how often you need them. This information is not intended to replace advice given to you by your health care provider. Make sure you discuss any questions you have with your health care provider. Document Released: 10/28/2015 Document Revised: 06/20/2016 Document Reviewed: 08/02/2015 Elsevier Interactive Patient Education  2017 [employer].  Fall Prevention in the Home Falls can cause injuries. They can happen to people of all ages. There are many things you can do to make your home safe and to help prevent falls. What can I do on the outside of my home? Regularly fix the edges of walkways and driveways and fix any cracks. Remove anything that might make you trip as you walk through a door, such as a raised step or threshold. Trim any bushes or trees on the path to your home. Use bright outdoor lighting. Clear any walking paths of anything that might make someone trip, such as rocks or tools. Regularly check to see if handrails are loose or broken. Make sure that both sides of any steps have handrails. Any raised decks and porches should have guardrails on the edges. Have any leaves, snow, or ice cleared regularly. Use sand or salt on walking paths during winter. Clean up any spills in your garage right away. This includes oil or grease spills. What can I do in the bathroom? Use night lights. Install grab bars by the toilet and in the tub and shower. Do not use towel bars as grab bars. Use non-skid mats or decals in the tub or shower. If you need to sit down in the shower, use a plastic, non-slip stool. Keep the floor dry. Clean up any water that spills on the floor as soon as it happens. Remove soap buildup in the tub or shower regularly. Attach bath mats securely with double-sided non-slip rug tape. Do not have throw rugs and other  things on the floor that can make you trip. What can I do in the bedroom? Use night lights. Make sure that you have a light by your bed that is easy to reach. Do not use any sheets or blankets that are too big for your bed. They should not hang down onto the floor. Have a firm chair that has side arms. You can use this for support while you get dressed. Do not have throw rugs and other things on the floor that can make you trip. What can I do in the kitchen? Clean up any spills right away. Avoid walking on wet floors. Keep items that you use a lot in easy-to-reach places. If you need to reach something above you, use a strong step stool that has a grab bar. Keep electrical cords out of the way. Do not use floor polish or wax that makes floors slippery. If you must use wax, use non-skid floor wax. Do not have throw rugs and other things on the floor that can make you trip. What can I do with my stairs? Do not leave any items on the stairs. Make sure that there are handrails on both sides of the stairs and use them. Fix handrails that are broken or loose. Make sure that handrails are as long as the stairways. Check any carpeting to make sure that it is firmly attached to the stairs. Fix any carpet that is loose or worn. Avoid  having throw rugs at the top or bottom of the stairs. If you do have throw rugs, attach them to the floor with carpet tape. Make sure that you have a light switch at the top of the stairs and the bottom of the stairs. If you do not have them, ask someone to add them for you. What else can I do to help prevent falls? Wear shoes that: Do not have high heels. Have rubber bottoms. Are comfortable and fit you well. Are closed at the toe. Do not wear sandals. If you use a stepladder: Make sure that it is fully opened. Do not climb a closed stepladder. Make sure that both sides of the stepladder are locked into place. Ask someone to hold it for you, if possible. Clearly  mark and make sure that you can see: Any grab bars or handrails. First and last steps. Where the edge of each step is. Use tools that help you move around (mobility aids) if they are needed. These include: Canes. Walkers. Scooters. Crutches. Turn on the lights when you go into a dark area. Replace any light bulbs as soon as they burn out. Set up your furniture so you have a clear path. Avoid moving your furniture around. If any of your floors are uneven, fix them. If there are any pets around you, be aware of where they are. Review your medicines with your doctor. Some medicines can make you feel dizzy. This can increase your chance of falling. Ask your doctor what other things that you can do to help prevent falls. This information is not intended to replace advice given to you by your health care provider. Make sure you discuss any questions you have with your health care provider. Document Released: 07/28/2009 Document Revised: 03/08/2016 Document Reviewed: 11/05/2014 Elsevier Interactive Patient Education  2017 [employer].

## 2022-01-23 ENCOUNTER — Telehealth: Payer: Self-pay | Admitting: Family Medicine

## 2022-01-26 ENCOUNTER — Ambulatory Visit (INDEPENDENT_AMBULATORY_CARE_PROVIDER_SITE_OTHER): Payer: Medicare Other

## 2022-01-26 ENCOUNTER — Other Ambulatory Visit: Payer: Self-pay | Admitting: Family Medicine

## 2022-01-26 DIAGNOSIS — E538 Deficiency of other specified B group vitamins: Secondary | ICD-10-CM | POA: Diagnosis not present

## 2022-01-26 DIAGNOSIS — G4700 Insomnia, unspecified: Secondary | ICD-10-CM

## 2022-01-26 DIAGNOSIS — F419 Anxiety disorder, unspecified: Secondary | ICD-10-CM

## 2022-01-26 MED ORDER — CYANOCOBALAMIN 1000 MCG/ML IJ SOLN
1000.0000 ug | Freq: Once | INTRAMUSCULAR | Status: AC
  Administered 2022-01-26: 1000 ug via INTRAMUSCULAR

## 2022-01-26 NOTE — Patient Instructions (Signed)
Health Maintenance Due  ?Topic Date Due  ? Hepatitis C Screening  Never done  ? Zoster Vaccines- Shingrix (1 of 2) Never done  ? COVID-19 Vaccine (2 - Pfizer risk series) 01/27/2020  ? ? ? ? Row Labels 01/08/2022  ?  2:10 PM 12/20/2021  ?  2:15 PM 11/14/2021  ?  1:57 PM  ?Depression screen PHQ 2/9   Section Header. No data exists in this row.     ?Decreased Interest   0 0 0  ?Down, Depressed, Hopeless   0 0 0  ?PHQ - 2 Score   0 0 0  ?Altered sleeping    3 3  ?Tired, decreased energy    1 0  ?Change in appetite    1 0  ?Feeling bad or failure about yourself     0 0  ?Trouble concentrating    0 0  ?Moving slowly or fidgety/restless    3 0  ?Suicidal thoughts    0 0  ?PHQ-9 Score    8 3  ?Difficult doing work/chores    Somewhat difficult Not difficult at all  ? ? ?

## 2022-01-26 NOTE — Progress Notes (Signed)
Chief Complaint  Patient presents with   Medication Consultation    Chantix   HPI: [name] is a 76 y.o. female with hx of GERD,HTN,insomnia,anxiety, CHF,and COPD here today to discuss smoking cessation, she would like to try Chantix. She took medication before and did help. She smokes 1 PPD. + PAD,CAD,and aortic atherosclerosis. She is on Atorvastatin 80 mg daily,Plavix 75 mg daily,and Aspirin 81 mg daily. COPD on Duoneb bid prn, Pulmicort neb bid,and trelegy ellipta 200-62.5-25 mcg daily. Cough,wheezing,and SOB exacerbated by exertion and stable. States that she feels "good."  Anxiety and insomnia. Denies depression like symptoms. On Paroxetine 20 mg daily. She takes Trazodone 100 mg at bedtime and Lorazepam 2 mg daily. Sleeping better since taking Hydrocodone-Acetaminophen  for back pain. Following with ortho and on PT.  Still nocturia x3.  Last visit, she was not sure if Paroxetine has helped with anxiety.     01/29/2022   12:31 PM 01/08/2022    2:10 PM 12/20/2021    2:15 PM 11/14/2021    1:57 PM 05/19/2021    2:48 PM  Depression screen PHQ 2/9  Decreased Interest 0 0 0 0 3  Down, Depressed, Hopeless 0 0 0 0 1  PHQ - 2 Score 0 0 0 0 4  Altered sleeping 0  3 3 3   Tired, decreased energy 1  1 0 2  Change in appetite 1  1 0 1  Feeling bad or failure about yourself  0  0 0 0  Trouble concentrating 0  0 0 1  Moving slowly or fidgety/restless 0  3 0 0  Suicidal thoughts 0  0 0 0  PHQ-9 Score 2  8 3 11   Difficult doing work/chores Not difficult at all  Somewhat difficult Not difficult at all Somewhat difficult   GERD: Having "a lot of" heartburn and acid reflux. She is taking OTC "antiacids", which help. Exacerbated by any food intake. No associated abdominal pain,N/V,or melena.  Review of Systems  Constitutional:  Negative for activity change, appetite change, fatigue and fever.  HENT:  Negative for mouth sores and nosebleeds.   Respiratory:  Negative for cough.    Cardiovascular:  Negative for chest pain, palpitations and leg swelling.  Gastrointestinal:        Negative for changes in bowel habits.  Genitourinary:  Negative for decreased urine volume, dysuria and hematuria.  Neurological:  Negative for syncope, weakness and headaches.  Rest see pertinent positives and negatives per HPI.  Current Outpatient Medications on File Prior to Visit  Medication Sig Dispense Refill   albuterol (VENTOLIN HFA) 108 (90 Base) MCG/ACT inhaler TAKE 2 PUFFS BY MOUTH EVERY 6 HOURS AS NEEDED FOR WHEEZE OR SHORTNESS OF BREATH 8.5 each 3   aspirin EC 81 MG tablet Take 1 tablet (81 mg total) by mouth daily. 30 tablet    atorvastatin (LIPITOR) 80 MG tablet Take 1 tablet (80 mg total) by mouth daily. 90 tablet 2   budesonide (PULMICORT) 0.5 MG/2ML nebulizer solution Take 2 mLs (0.5 mg total) by nebulization 2 (two) times daily. 20 mL 12   cilostazol (PLETAL) 100 MG tablet Take 1 tablet (100 mg total) by mouth 2 (two) times daily. 180 tablet 1   clopidogrel (PLAVIX) 75 MG tablet TAKE 1 TABLET BY MOUTH EVERY DAY 90 tablet 3   DUPIXENT 300 MG/2ML SOPN Inject 300 mg into the skin every 14 (fourteen) days. Every other Friday     ezetimibe (ZETIA) 10 MG tablet Take 1 tablet (  10 mg total) by mouth daily. 90 tablet 3   ferrous sulfate 325 (65 FE) MG tablet TAKE 1 TABLET BY MOUTH EVERY DAY WITH BREAKFAST 90 tablet 1   fluticasone (FLONASE) 50 MCG/ACT nasal spray SPRAY 1 SPRAY INTO EACH NOSTRIL TWICE A DAY 48 mL 1   Fluticasone-Umeclidin-Vilant (TRELEGY ELLIPTA) 200-62.5-25 MCG/ACT AEPB TAKE 1 PUFF BY MOUTH EVERY DAY 60 each 12   furosemide (LASIX) 40 MG tablet TAKE 1 TABLET BY MOUTH TWICE A DAY 180 tablet 1   gabapentin (NEURONTIN) 300 MG capsule Take 300 mg by mouth 4 (four) times daily as needed (for nerve pain).     HYDROcodone-acetaminophen (NORCO/VICODIN) 5-325 MG tablet Take 1 tablet by mouth at bedtime.     hydrOXYzine (ATARAX/VISTARIL) 25 MG tablet Take 25 mg by mouth daily  as needed for itching.     ipratropium-albuterol (DUONEB) 0.5-2.5 (3) MG/3ML SOLN Inhale 3 mLs into the lungs as directed. TID for 1 week and then use as needed Q6 hours. 360 mL 4   levothyroxine (SYNTHROID) 112 MCG tablet 0.5 tab Tuesday and Thursday, continue 1 tab Monday,Wednesday,Friday,Sat,and Sunday. 90 tablet 2   LORazepam (ATIVAN) 2 MG tablet TAKE 1 TABLET BY MOUTH EVERYDAY AT BEDTIME 30 tablet 3   montelukast (SINGULAIR) 10 MG tablet TAKE 1 TABLET BY MOUTH EVERYDAY AT BEDTIME 90 tablet 1   PARoxetine (PAXIL) 20 MG tablet Take 1 tablet (20 mg total) by mouth daily. 90 tablet 2   potassium chloride SA (KLOR-CON M) 20 MEQ tablet Take 1 tablet (20 mEq total) by mouth daily. 30 tablet 3   predniSONE (DELTASONE) 10 MG tablet Take 50mg  daily for 3days,Take 40mg  daily for 3days,Take 30mg  daily for 3days,Take 20mg  daily for 3days,Take 10mg  daily for 3days, then stop 45 tablet 0   tamsulosin (FLOMAX) 0.4 MG CAPS capsule TAKE 1 CAPSULE BY MOUTH EVERY DAY 90 capsule 0   traZODone (DESYREL) 100 MG tablet TAKE 1 TABLET BY MOUTH EVERYDAY AT BEDTIME 90 tablet 1   No current facility-administered medications on file prior to visit.   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)    05/ 2018  chronic non-occlusive DVT RLE   COPD, frequent exacerbations (HCC)    03-06-2018 per pt last exacerbation 12/ 2018   Coronary artery disease    cardiologist-  dr Shirlee Latch-- 03-11-2018 er cath , 80% stenosis in the ostial second diagonal   Dyspnea on minimal exertion    Emphysema/COPD (HCC)    CAT score- 17   Family history of breast cancer    Family history of colon cancer    Family history of melanoma    Family history of ovarian cancer    Family history of pancreatic cancer    Fibromyalgia 1995   GERD (gastroesophageal reflux disease)    Hiatal  hernia    History of breast cancer    History of diverticulitis of colon    2002  s/p  sigmoid colectomy   History of multiple pulmonary nodules    hx RUL nodules x2  s/p right VATS w/ wedge resection 09-11-2005 and 06-14-2011  both  necrotizing granulomatous inflammation w/ cystic area of necrosis & focal calcification   History of right breast cancer oncologist-  dr [name]-- no recurrence   dx 2010--  IDC, Stage IA ,  ER/PR+,  (pT1c pN0) 11-09-2008 right lumpectomy;  12-18-2008 right simple mastectomy for DCIS margins;  completed chemotherapy 2010 (no radiation) and completed antiestrogen therapy   Hx of colonic polyps    Dr. 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   Nocturia    OA (osteoarthritis)    thumbs   On supplemental oxygen therapy    03-06-2018 per pt uses only at night,  checks O2 sats at home,  stated am sat 89% after moving around average 93-94% with RA   Osteoporosis    Peripheral arterial occlusive disease (HCC) vascular-- dr chen/ dr Randie Heinz   proximal right SFA severe focal stenosis with collaterals from the PFA/  04/ 2012 occluded celiac and SMA arteries with distal reconstitution w/ patent IMA   Peripheral vascular disease (HCC)    chronic DVT RLE,  mesenteric vascular disease   Right lower lobe pulmonary nodule    Chest CT 08-29-2017   Stage 3 severe COPD by GOLD classification (HCC) hx frequent exacerbations--   pulmologist-  dr Inocente Salles--  per lov note , dated 12-20-2017, oxyogen 2L is prescribed for use with exertion (but pt only uses mostly at night), O2 sats on RA run the 80s , this day sat 86% RA and with 2L O2 sat 92%   Varicose veins of leg with swelling    varicose vein surgery - Dr. Guss Bunde   Wears glasses    Wears hearing aid in both ears    Allergies  Allergen Reactions   Sinequan [Doxepin] Other (See Comments)    Kept her awake     Sulfa Antibiotics Swelling   Sulfur     Other reaction(s): Unknown   Social History   Socioeconomic History   Marital status: Married    Spouse name: Not on file   Number of children: 3   Years of education: 12   Highest education level: Not on file  Occupational History   Occupation: Magazine features editor: RETIRED    Comment: retired  Tobacco Use   Smoking status: Former    Packs/day: 0.75    Years: 50.00    Pack years: 37.50    Types: Cigarettes    Quit date: 05/18/2016    Years since quitting: 5.7   Smokeless tobacco: Never  Vaping Use   Vaping Use: Never used  Substance and Sexual Activity   Alcohol use: Yes    Alcohol/week: 10.0 standard drinks    Types: 10 Cans of beer per week   Drug use: No   Sexual activity: Not on file  Other Topics Concern   Not on file  Social History Narrative   HSG, beauty school. Married '69 -7 yrs/widowed; married '79 - 34yr/divorced; married '87.  2 sons - '70, '74, 1 srtep-son; 1 grandchild. 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 SO - good health.  NO history of abuse.  ACP - full code and all heroic measures.    Social Determinants of Health   Financial Resource Strain: Not on file  Food Insecurity: Not on file  Transportation Needs: Not on file  Physical Activity: Not on file  Stress: Not on file  Social Connections: Not on file   Vitals:   01/29/22 1220  BP: 118/70  Pulse: 100  Resp: 16  SpO2: 93%   Body mass index is 21.51 kg/m.  Physical Exam Vitals  and nursing note reviewed.  Constitutional:      General: She is not in acute distress.    Appearance: She is well-developed.  HENT:     Head: Normocephalic and atraumatic.     Mouth/Throat:     Mouth: Mucous membranes are moist.  Eyes:     Conjunctiva/sclera: Conjunctivae normal.  Cardiovascular:     Rate and Rhythm: Normal rate and regular rhythm.     Heart sounds: No murmur heard. Pulmonary:     Effort: Pulmonary effort is normal. No respiratory distress.      Breath sounds: Normal breath sounds.  Abdominal:     Palpations: Abdomen is soft. There is no hepatomegaly.     Tenderness: There is no abdominal tenderness.  Lymphadenopathy:     Cervical: No cervical adenopathy.  Skin:    General: Skin is warm.     Findings: No erythema or rash.  Neurological:     General: No focal deficit present.     Mental Status: She is alert and oriented to person, place, and time.     Comments: Otherwise stable gait, not assisted.  Psychiatric:     Comments: Well groomed, good eye contact.   ASSESSMENT AND PLAN:  [name] was seen today for medication consultation.  Diagnoses and all orders for this visit:  Tobacco use disorder We discussed adverse effects if tobacco use and benefits of smoking cessation. She would like to try Chantix again.  -     varenicline (CHANTIX) 0.5 MG tablet; One tab daily x 3 days then 2 times daily for 4 days. -     varenicline (CHANTIX CONTINUING MONTH PAK) 1 MG tablet; Take 1 tablet (1 mg total) by mouth 2 (two) times daily.  Insomnia, unspecified type Improved with pain management. Continue Trazodone 100 mg daily and Lorazepam 2 mg daily at bedtime.  Gastroesophageal reflux disease, unspecified whether esophagitis present Problem is not well controlled. She has taken Protonix 40 mg before, recommend trying 20 mg daily. Because she takes Levothyroxine in the morning, she can take PPI at bedtime. Protonix dose can be increased to 20 mg bid if still symptomatic.  GERD precautions to continue.  -     pantoprazole (PROTONIX) 20 MG tablet; Take 1 tablet (20 mg total) by mouth at bedtime.  Anxiety disorder, unspecified type She is not sure if Paroxetine is helping and would like to try wean it off. She can try alternating Paroxetine dose between 20 and 10 mg every other day. No changes in Trazodone or Lorazepam. Good sleep hygiene. Instructed about warning signs.  Return if symptoms worsen or fail to improve, for Keep  next appt.  Carol Loftin G. [place], MD  [facility]. [place] office.

## 2022-01-26 NOTE — Progress Notes (Signed)
Per orders of Martinique, [name], MD, injection of B12 given in Left deltoid per pt request due to hx of breast cancer R breast by Franco Collet. ?Patient tolerated injection well. ? ?Lab Results  ?Component Value Date  ? VITAMINB12 191 (L) 11/14/2021  ? ? ? ? ?

## 2022-01-26 NOTE — Telephone Encounter (Signed)
-   last filled 12/26/21 ?- last OV 12/20/21 ?- next OV 01/29/22 & 06/2022 ?

## 2022-01-29 ENCOUNTER — Ambulatory Visit (INDEPENDENT_AMBULATORY_CARE_PROVIDER_SITE_OTHER): Payer: Medicare Other | Admitting: Family Medicine

## 2022-01-29 ENCOUNTER — Encounter: Payer: Self-pay | Admitting: Family Medicine

## 2022-01-29 VITALS — BP 118/70 | HR 100 | Resp 16 | Ht 65.0 in | Wt 129.2 lb

## 2022-01-29 DIAGNOSIS — F419 Anxiety disorder, unspecified: Secondary | ICD-10-CM | POA: Diagnosis not present

## 2022-01-29 DIAGNOSIS — K219 Gastro-esophageal reflux disease without esophagitis: Secondary | ICD-10-CM

## 2022-01-29 DIAGNOSIS — G4700 Insomnia, unspecified: Secondary | ICD-10-CM

## 2022-01-29 DIAGNOSIS — F17200 Nicotine dependence, unspecified, uncomplicated: Secondary | ICD-10-CM

## 2022-01-29 MED ORDER — VARENICLINE TARTRATE 0.5 MG PO TABS: ORAL_TABLET | ORAL | 0 refills | Status: AC

## 2022-01-29 MED ORDER — VARENICLINE TARTRATE 1 MG PO TABS: 1.0000 mg | ORAL_TABLET | Freq: Two times a day (BID) | ORAL | 0 refills | Status: DC

## 2022-01-29 NOTE — Patient Instructions (Addendum)
A few things to remember from today's visit: ? ? ?Anxiety disorder, unspecified type ? ?Tobacco use disorder - Plan: varenicline (CHANTIX) 0.5 MG tablet, varenicline (CHANTIX CONTINUING MONTH PAK) 1 MG tablet ? ?If you need refills please call your pharmacy. ?Do not use My Chart to request refills or for acute issues that need immediate attention. ?  ?Start with Chantix 0.5 mg 1 tab daily for 3 days then 1 tab 2 times daily for 4 days then start 1 mg tabs as instructed on prescription. ?Treatment is for 11 weeks. ? ?Let try to decrease Paroxetine, so alternate between 1 tab and 1/2 tab every other day, 1=1 tab-1/2 tab-1 tab and so fourth. ?No changes in rest. ?Keep next visit. ? ?Please be sure medication list is accurate. ?If a new problem present, please set up appointment sooner than planned today. ? ? ? ? ? ? ? ?

## 2022-02-01 ENCOUNTER — Encounter: Payer: Self-pay | Admitting: Family Medicine

## 2022-02-01 MED ORDER — PANTOPRAZOLE SODIUM 20 MG PO TBEC: 20.0000 mg | DELAYED_RELEASE_TABLET | Freq: Every day | ORAL | 1 refills | Status: DC

## 2022-02-14 ENCOUNTER — Other Ambulatory Visit: Payer: Self-pay | Admitting: Family Medicine

## 2022-02-14 DIAGNOSIS — E876 Hypokalemia: Secondary | ICD-10-CM

## 2022-02-23 ENCOUNTER — Other Ambulatory Visit: Payer: Self-pay | Admitting: Family Medicine

## 2022-02-23 DIAGNOSIS — F17200 Nicotine dependence, unspecified, uncomplicated: Secondary | ICD-10-CM

## 2022-02-26 ENCOUNTER — Ambulatory Visit (INDEPENDENT_AMBULATORY_CARE_PROVIDER_SITE_OTHER): Payer: Medicare Other

## 2022-02-26 DIAGNOSIS — E538 Deficiency of other specified B group vitamins: Secondary | ICD-10-CM

## 2022-02-26 MED ORDER — CYANOCOBALAMIN 1000 MCG/ML IJ SOLN
1000.0000 ug | INTRAMUSCULAR | Status: AC
  Administered 2022-02-26 – 2022-08-29 (×3): 1000 ug via INTRAMUSCULAR

## 2022-02-26 NOTE — Progress Notes (Signed)
Pt here for monthly B12 injection per Dr Martinique. ? ?B12 1057mcg given IM left deltoid and pt tolerated injection well. ? ?Next B12 injection scheduled for 03/29/22. ? ?

## 2022-03-10 ENCOUNTER — Other Ambulatory Visit: Payer: Self-pay | Admitting: Family Medicine

## 2022-03-13 ENCOUNTER — Other Ambulatory Visit: Payer: Self-pay | Admitting: *Deleted

## 2022-03-13 DIAGNOSIS — I2510 Atherosclerotic heart disease of native coronary artery without angina pectoris: Secondary | ICD-10-CM

## 2022-03-13 DIAGNOSIS — I10 Essential (primary) hypertension: Secondary | ICD-10-CM

## 2022-03-13 DIAGNOSIS — E7800 Pure hypercholesterolemia, unspecified: Secondary | ICD-10-CM

## 2022-03-13 MED ORDER — ATORVASTATIN CALCIUM 80 MG PO TABS: 80.0000 mg | ORAL_TABLET | Freq: Every day | ORAL | 2 refills | Status: DC

## 2022-03-15 DIAGNOSIS — M5416 Radiculopathy, lumbar region: Secondary | ICD-10-CM | POA: Diagnosis not present

## 2022-03-16 DIAGNOSIS — M545 Low back pain, unspecified: Secondary | ICD-10-CM | POA: Diagnosis not present

## 2022-03-19 DIAGNOSIS — M5416 Radiculopathy, lumbar region: Secondary | ICD-10-CM | POA: Diagnosis not present

## 2022-03-21 DIAGNOSIS — R0602 Shortness of breath: Secondary | ICD-10-CM | POA: Diagnosis not present

## 2022-03-21 DIAGNOSIS — J449 Chronic obstructive pulmonary disease, unspecified: Secondary | ICD-10-CM | POA: Diagnosis not present

## 2022-03-21 DIAGNOSIS — J9601 Acute respiratory failure with hypoxia: Secondary | ICD-10-CM | POA: Diagnosis not present

## 2022-03-26 ENCOUNTER — Other Ambulatory Visit: Payer: Self-pay | Admitting: Family Medicine

## 2022-03-26 DIAGNOSIS — L853 Xerosis cutis: Secondary | ICD-10-CM | POA: Diagnosis not present

## 2022-03-26 DIAGNOSIS — L2084 Intrinsic (allergic) eczema: Secondary | ICD-10-CM | POA: Diagnosis not present

## 2022-03-26 DIAGNOSIS — F17200 Nicotine dependence, unspecified, uncomplicated: Secondary | ICD-10-CM

## 2022-03-26 DIAGNOSIS — L821 Other seborrheic keratosis: Secondary | ICD-10-CM | POA: Diagnosis not present

## 2022-03-28 NOTE — Telephone Encounter (Signed)
This medication is not meant to be refilled. It was prescribed last visit and I sent the whole treatment,11 weeks. Thanks, BJ

## 2022-03-29 ENCOUNTER — Ambulatory Visit (INDEPENDENT_AMBULATORY_CARE_PROVIDER_SITE_OTHER): Payer: Medicare Other | Admitting: *Deleted

## 2022-03-29 DIAGNOSIS — E538 Deficiency of other specified B group vitamins: Secondary | ICD-10-CM | POA: Diagnosis not present

## 2022-03-29 MED ORDER — CYANOCOBALAMIN 1000 MCG/ML IJ SOLN
1000.0000 ug | Freq: Once | INTRAMUSCULAR | Status: AC
  Administered 2022-03-29: 1000 ug via INTRAMUSCULAR

## 2022-03-29 NOTE — Progress Notes (Signed)
  Per orders of Dr. Volanda Napoleon, injection of Cyanocobalamin Injection 1000 mcg/mL given by Encarnacion Slates. On Left Deltoid Patient tolerated injection well.

## 2022-03-30 NOTE — Telephone Encounter (Signed)
error 

## 2022-04-03 DIAGNOSIS — M5416 Radiculopathy, lumbar region: Secondary | ICD-10-CM | POA: Diagnosis not present

## 2022-04-13 DIAGNOSIS — K8020 Calculus of gallbladder without cholecystitis without obstruction: Secondary | ICD-10-CM | POA: Diagnosis not present

## 2022-04-13 DIAGNOSIS — N131 Hydronephrosis with ureteral stricture, not elsewhere classified: Secondary | ICD-10-CM | POA: Diagnosis not present

## 2022-04-13 DIAGNOSIS — N1330 Unspecified hydronephrosis: Secondary | ICD-10-CM | POA: Diagnosis not present

## 2022-04-13 DIAGNOSIS — K449 Diaphragmatic hernia without obstruction or gangrene: Secondary | ICD-10-CM | POA: Diagnosis not present

## 2022-04-13 DIAGNOSIS — K439 Ventral hernia without obstruction or gangrene: Secondary | ICD-10-CM | POA: Diagnosis not present

## 2022-04-18 DIAGNOSIS — M5416 Radiculopathy, lumbar region: Secondary | ICD-10-CM | POA: Diagnosis not present

## 2022-04-20 DIAGNOSIS — J449 Chronic obstructive pulmonary disease, unspecified: Secondary | ICD-10-CM | POA: Diagnosis not present

## 2022-04-20 DIAGNOSIS — R0602 Shortness of breath: Secondary | ICD-10-CM | POA: Diagnosis not present

## 2022-04-20 DIAGNOSIS — J9601 Acute respiratory failure with hypoxia: Secondary | ICD-10-CM | POA: Diagnosis not present

## 2022-04-26 ENCOUNTER — Ambulatory Visit (INDEPENDENT_AMBULATORY_CARE_PROVIDER_SITE_OTHER): Payer: Medicare Other | Admitting: *Deleted

## 2022-04-26 DIAGNOSIS — E538 Deficiency of other specified B group vitamins: Secondary | ICD-10-CM | POA: Diagnosis not present

## 2022-04-26 MED ORDER — CYANOCOBALAMIN 1000 MCG/ML IJ SOLN
1000.0000 ug | Freq: Once | INTRAMUSCULAR | Status: AC
  Administered 2022-04-26: 1000 ug via INTRAMUSCULAR

## 2022-04-26 NOTE — Progress Notes (Signed)
Pt here for monthly B12 injection per   B12 1020mcg given IM, and pt tolerated injection well.  Next B12 injection scheduled for x 1 month

## 2022-05-04 ENCOUNTER — Other Ambulatory Visit: Payer: Self-pay

## 2022-05-04 ENCOUNTER — Telehealth: Payer: Self-pay | Admitting: Internal Medicine

## 2022-05-04 DIAGNOSIS — I2510 Atherosclerotic heart disease of native coronary artery without angina pectoris: Secondary | ICD-10-CM

## 2022-05-04 DIAGNOSIS — F419 Anxiety disorder, unspecified: Secondary | ICD-10-CM

## 2022-05-04 DIAGNOSIS — I10 Essential (primary) hypertension: Secondary | ICD-10-CM

## 2022-05-04 DIAGNOSIS — E039 Hypothyroidism, unspecified: Secondary | ICD-10-CM

## 2022-05-04 DIAGNOSIS — E7800 Pure hypercholesterolemia, unspecified: Secondary | ICD-10-CM

## 2022-05-04 DIAGNOSIS — E876 Hypokalemia: Secondary | ICD-10-CM

## 2022-05-04 DIAGNOSIS — I739 Peripheral vascular disease, unspecified: Secondary | ICD-10-CM

## 2022-05-04 MED ORDER — CILOSTAZOL 100 MG PO TABS: 100.0000 mg | ORAL_TABLET | Freq: Two times a day (BID) | ORAL | 1 refills | Status: DC

## 2022-05-04 MED ORDER — FUROSEMIDE 40 MG PO TABS: 40.0000 mg | ORAL_TABLET | Freq: Two times a day (BID) | ORAL | 1 refills | Status: DC

## 2022-05-04 MED ORDER — FLUTICASONE PROPIONATE 50 MCG/ACT NA SUSP
NASAL | 1 refills | Status: DC
Start: 2022-05-04 — End: 2022-05-25

## 2022-05-04 MED ORDER — LEVOTHYROXINE SODIUM 112 MCG PO TABS: ORAL_TABLET | ORAL | 2 refills | Status: DC

## 2022-05-04 MED ORDER — TAMSULOSIN HCL 0.4 MG PO CAPS: 0.4000 mg | ORAL_CAPSULE | Freq: Every day | ORAL | 0 refills | Status: DC

## 2022-05-04 MED ORDER — POTASSIUM CHLORIDE CRYS ER 20 MEQ PO TBCR: 20.0000 meq | EXTENDED_RELEASE_TABLET | Freq: Every day | ORAL | 1 refills | Status: DC

## 2022-05-04 MED ORDER — TRAZODONE HCL 100 MG PO TABS: ORAL_TABLET | ORAL | 1 refills | Status: DC

## 2022-05-04 MED ORDER — PAROXETINE HCL 20 MG PO TABS: 20.0000 mg | ORAL_TABLET | Freq: Every day | ORAL | 2 refills | Status: DC

## 2022-05-04 NOTE — Telephone Encounter (Signed)
Attempted to call [place]. LVMTCB.

## 2022-05-15 ENCOUNTER — Other Ambulatory Visit: Payer: Medicare Other

## 2022-05-17 ENCOUNTER — Other Ambulatory Visit: Payer: Self-pay | Admitting: Family Medicine

## 2022-05-17 DIAGNOSIS — F17200 Nicotine dependence, unspecified, uncomplicated: Secondary | ICD-10-CM

## 2022-05-17 NOTE — Telephone Encounter (Signed)
OV notes from 01/29/22: Start with Chantix 0.5 mg 1 tab daily for 3 days then 1 tab 2 times daily for 4 days then start 1 mg tabs as instructed on prescription. Treatment is for 11 weeks.

## 2022-05-21 DIAGNOSIS — J9601 Acute respiratory failure with hypoxia: Secondary | ICD-10-CM | POA: Diagnosis not present

## 2022-05-21 DIAGNOSIS — R0602 Shortness of breath: Secondary | ICD-10-CM | POA: Diagnosis not present

## 2022-05-21 DIAGNOSIS — J449 Chronic obstructive pulmonary disease, unspecified: Secondary | ICD-10-CM | POA: Diagnosis not present

## 2022-05-22 DIAGNOSIS — J449 Chronic obstructive pulmonary disease, unspecified: Secondary | ICD-10-CM | POA: Diagnosis not present

## 2022-05-24 ENCOUNTER — Other Ambulatory Visit: Payer: Self-pay | Admitting: Family Medicine

## 2022-05-24 DIAGNOSIS — F419 Anxiety disorder, unspecified: Secondary | ICD-10-CM

## 2022-05-24 DIAGNOSIS — G4700 Insomnia, unspecified: Secondary | ICD-10-CM

## 2022-05-25 ENCOUNTER — Other Ambulatory Visit: Payer: Self-pay

## 2022-05-25 DIAGNOSIS — K219 Gastro-esophageal reflux disease without esophagitis: Secondary | ICD-10-CM

## 2022-05-25 DIAGNOSIS — E876 Hypokalemia: Secondary | ICD-10-CM

## 2022-05-25 DIAGNOSIS — E039 Hypothyroidism, unspecified: Secondary | ICD-10-CM

## 2022-05-25 DIAGNOSIS — M5416 Radiculopathy, lumbar region: Secondary | ICD-10-CM | POA: Diagnosis not present

## 2022-05-25 DIAGNOSIS — M47816 Spondylosis without myelopathy or radiculopathy, lumbar region: Secondary | ICD-10-CM | POA: Diagnosis not present

## 2022-05-25 MED ORDER — TAMSULOSIN HCL 0.4 MG PO CAPS: 0.4000 mg | ORAL_CAPSULE | Freq: Every day | ORAL | 0 refills | Status: DC

## 2022-05-25 MED ORDER — PANTOPRAZOLE SODIUM 20 MG PO TBEC: 20.0000 mg | DELAYED_RELEASE_TABLET | Freq: Every day | ORAL | 1 refills | Status: DC

## 2022-05-25 MED ORDER — LEVOTHYROXINE SODIUM 112 MCG PO TABS: ORAL_TABLET | ORAL | 2 refills | Status: DC

## 2022-05-25 MED ORDER — FERROUS SULFATE 325 (65 FE) MG PO TABS: ORAL_TABLET | ORAL | 1 refills | Status: DC

## 2022-05-25 MED ORDER — FUROSEMIDE 40 MG PO TABS: 40.0000 mg | ORAL_TABLET | Freq: Two times a day (BID) | ORAL | 1 refills | Status: DC

## 2022-05-25 MED ORDER — FLUTICASONE PROPIONATE 50 MCG/ACT NA SUSP: NASAL | 1 refills | Status: DC

## 2022-05-25 MED ORDER — POTASSIUM CHLORIDE CRYS ER 20 MEQ PO TBCR: 20.0000 meq | EXTENDED_RELEASE_TABLET | Freq: Every day | ORAL | 1 refills | Status: DC

## 2022-05-25 NOTE — Telephone Encounter (Signed)
-   last filled 04/23/22

## 2022-05-29 ENCOUNTER — Ambulatory Visit (INDEPENDENT_AMBULATORY_CARE_PROVIDER_SITE_OTHER): Payer: Medicare Other | Admitting: *Deleted

## 2022-05-29 DIAGNOSIS — E538 Deficiency of other specified B group vitamins: Secondary | ICD-10-CM

## 2022-05-29 MED ORDER — CYANOCOBALAMIN 1000 MCG/ML IJ SOLN
1000.0000 ug | Freq: Once | INTRAMUSCULAR | Status: AC
  Administered 2022-05-29: 1000 ug via INTRAMUSCULAR

## 2022-05-29 NOTE — Progress Notes (Signed)
Per orders of Dr. Elease Hashimoto, injection of Cyanocobalamin 1026mcg given by Agnes Lawrence. Patient tolerated injection well.

## 2022-05-30 NOTE — Telephone Encounter (Signed)
Attempted to call [facility] but unable to reach. Left detailed message to let them know that we are unable to refill any meds for pt until she is seen for an appt. Pt has not been seen since 2021. Nothing further needed.

## 2022-06-04 ENCOUNTER — Other Ambulatory Visit: Payer: Self-pay | Admitting: Family Medicine

## 2022-06-04 ENCOUNTER — Other Ambulatory Visit: Payer: Self-pay

## 2022-06-06 ENCOUNTER — Other Ambulatory Visit: Payer: Self-pay | Admitting: Family Medicine

## 2022-06-06 DIAGNOSIS — E039 Hypothyroidism, unspecified: Secondary | ICD-10-CM

## 2022-06-06 DIAGNOSIS — F419 Anxiety disorder, unspecified: Secondary | ICD-10-CM

## 2022-06-19 ENCOUNTER — Other Ambulatory Visit: Payer: Self-pay | Admitting: Family Medicine

## 2022-06-19 ENCOUNTER — Other Ambulatory Visit: Payer: Self-pay | Admitting: Primary Care

## 2022-06-19 DIAGNOSIS — M47816 Spondylosis without myelopathy or radiculopathy, lumbar region: Secondary | ICD-10-CM | POA: Diagnosis not present

## 2022-06-21 DIAGNOSIS — J9601 Acute respiratory failure with hypoxia: Secondary | ICD-10-CM | POA: Diagnosis not present

## 2022-06-21 DIAGNOSIS — J449 Chronic obstructive pulmonary disease, unspecified: Secondary | ICD-10-CM | POA: Diagnosis not present

## 2022-06-21 DIAGNOSIS — R0602 Shortness of breath: Secondary | ICD-10-CM | POA: Diagnosis not present

## 2022-06-22 DIAGNOSIS — J449 Chronic obstructive pulmonary disease, unspecified: Secondary | ICD-10-CM | POA: Diagnosis not present

## 2022-06-27 NOTE — Progress Notes (Signed)
HPI: [name] is a 76 y.o. female with medical hx significant for CHF,severe COPD,insomnia,anxiety,depression, CAD,and PAD here today with her husband for follow up. She was last seen on 01/29/22. Anxiety and insomnia: She is on Lorazepam 2 mg at bedtime prn,Trazodone 100 mg at night,and Paxil 20 mg daily. She has tolerated medications well. Lorazepam and Trazodone has helped with a better sleep but no for the past few days. He has not been able to sleep for the past few nights.     06/29/2022    1:59 PM 01/29/2022   12:31 PM 01/08/2022    2:10 PM 12/20/2021    2:15 PM 11/14/2021    1:57 PM  Depression screen PHQ 2/9  Decreased Interest 0 0 0 0 0  Down, Depressed, Hopeless 0 0 0 0 0  PHQ - 2 Score 0 0 0 0 0  Altered sleeping 3 0  3 3  Tired, decreased energy 3 1  1  0  Change in appetite 3 1  1  0  Feeling bad or failure about yourself  3 0  0 0  Trouble concentrating 0 0  0 0  Moving slowly or fidgety/restless 0 0  3 0  Suicidal thoughts 0 0  0 0  PHQ-9 Score 12 2  8 3   Difficult doing work/chores Not difficult at all Not difficult at all  Somewhat difficult Not difficult at all   Today she is c/o chest tightness and worsening cough and SOB. She had a cold a couple weeks ago and has not completely recovered. Productive cough with clear sputum, negative for hemoptysis. She has not noted fever or chills.  She is taking Mucinex. She is on Duoneb tid prn ,Pulmicort neb bid,and Trelegy Ellipta 200-62.5-25 mcg 1 puff daily, following with Dr Maple Hudson.  -B12 deficiency: She is on B12 1000 mcg q 4-5 weeks. Lab Results  Component Value Date   VITAMINB12 191 (L) 11/14/2021   -Hypothyroidism: She is on Levothyroxine 112 mcg daily.. Tolerating medication well, no side effects reported. Negative for dysphagia, palpitations, abdominal pain, changes in bowel habits, worsening tremor, cold/heat intolerance, or abnormal weight loss. Lab Results  Component Value Date   TSH 0.44  07/19/2021   -HypoK+: She is on KLOR 20 meq daily. She thinks she has some "fluid" retention, abdomen mildly distended. She took an extra Furosemide a couple times this week. She is sleeping on a high pillow, no changes. 11/03/21 abdominal US negative for significant ascites.  Negative for PND or LE edema. Echo 07/2021 with LVEF 55-60% and grade II diastolic dysfunction.  Lab Results  Component Value Date   CREATININE 0.79 12/20/2021   BUN 22 12/20/2021   NA 136 12/20/2021   K 4.2 12/20/2021   CL 99 12/20/2021   CO2 28 12/20/2021   Lab Results  Component Value Date   WBC 6.6 11/07/2021   HGB 14.8 11/07/2021   HCT 44.9 11/07/2021   MCV 92.0 11/07/2021   PLT 399 11/07/2021   Lower back pain (R>L) and sense of LE weakness. She is not longer having LE pain. She has received epidural injection and has been prescribed Hydrocodone-Acetaminophen and Gabapentin 300 mg ,taking the latter one 2 caps bid. According to pt, she was instructed to ask her PCP for Hydrocodone-Acetaminophen prescription.  Pain is worse at night, interfering with her sleep and her husband's. Negative for dysuria or gross hematuria. Epidural injection has not helped, she has an appt with provider next week.  Review of  Systems  Constitutional:  Positive for activity change and fatigue. Negative for appetite change.  HENT:  Negative for mouth sores and sore throat.   Cardiovascular:  Negative for chest pain.  Gastrointestinal:  Negative for abdominal pain, nausea and vomiting.  Musculoskeletal:  Positive for back pain and gait problem.  Neurological:  Negative for syncope, weakness and headaches.  Psychiatric/Behavioral:  Negative for confusion. The patient is nervous/anxious.   Rest see pertinent positives and negatives per HPI.  Current Outpatient Medications on File Prior to Visit  Medication Sig Dispense Refill   albuterol (VENTOLIN HFA) 108 (90 Base) MCG/ACT inhaler TAKE 2 PUFFS BY MOUTH EVERY 6 HOURS AS  NEEDED FOR WHEEZE OR SHORTNESS OF BREATH 8.5 each 3   aspirin EC 81 MG tablet Take 1 tablet (81 mg total) by mouth daily. 30 tablet    atorvastatin (LIPITOR) 80 MG tablet Take 1 tablet (80 mg total) by mouth daily. 90 tablet 2   budesonide (PULMICORT) 0.5 MG/2ML nebulizer solution Take 2 mLs (0.5 mg total) by nebulization 2 (two) times daily. 20 mL 12   cilostazol (PLETAL) 100 MG tablet Take 1 tablet (100 mg total) by mouth 2 (two) times daily. 180 tablet 1   DUPIXENT 300 MG/2ML SOPN Inject 300 mg into the skin every 14 (fourteen) days. Every other Friday     ezetimibe (ZETIA) 10 MG tablet Take 1 tablet (10 mg total) by mouth daily. 90 tablet 3   ferrous sulfate 325 (65 FE) MG tablet TAKE 1 TABLET BY MOUTH EVERY DAY WITH BREAKFAST 90 tablet 1   fluticasone (FLONASE) 50 MCG/ACT nasal spray USE 1 SPRAY INTO EACH NOSTRIL TWICE A DAY 48 mL 1   Fluticasone-Umeclidin-Vilant (TRELEGY ELLIPTA) 200-62.5-25 MCG/ACT AEPB TAKE 1 PUFF BY MOUTH EVERY DAY 60 each 12   furosemide (LASIX) 40 MG tablet TAKE 1 TABLET BY MOUTH TWICE A DAY 180 tablet 1   gabapentin (NEURONTIN) 300 MG capsule Take 300 mg by mouth 4 (four) times daily as needed (for nerve pain).     HYDROcodone-acetaminophen (NORCO/VICODIN) 5-325 MG tablet Take 1 tablet by mouth at bedtime.     hydrOXYzine (ATARAX/VISTARIL) 25 MG tablet Take 25 mg by mouth daily as needed for itching.     ipratropium-albuterol (DUONEB) 0.5-2.5 (3) MG/3ML SOLN Inhale 3 mLs into the lungs as directed. TID for 1 week and then use as needed Q6 hours. 360 mL 4   levothyroxine (SYNTHROID) 112 MCG tablet TAKE 1 TABLET BY MOUTH EVERY DAY IN THE MORNING BEFORE BREAKFAST 90 tablet 2   LORazepam (ATIVAN) 2 MG tablet TAKE 1 TABLET BY MOUTH EVERYDAY AT BEDTIME 30 tablet 3   montelukast (SINGULAIR) 10 MG tablet TAKE 1 TABLET BY MOUTH EVERYDAY AT BEDTIME 90 tablet 1   pantoprazole (PROTONIX) 20 MG tablet Take 1 tablet (20 mg total) by mouth at bedtime. 90 tablet 1   PARoxetine (PAXIL)  20 MG tablet Take 1 tablet (20 mg total) by mouth daily. 90 tablet 2   potassium chloride SA (KLOR-CON M20) 20 MEQ tablet Take 1 tablet (20 mEq total) by mouth daily. 90 tablet 1   tamsulosin (FLOMAX) 0.4 MG CAPS capsule Take 1 capsule (0.4 mg total) by mouth daily. 90 capsule 0   traZODone (DESYREL) 100 MG tablet TAKE 1 TABLET BY MOUTH EVERYDAY AT BEDTIME 90 tablet 1   varenicline (CHANTIX) 1 MG tablet TAKE 1 TABLET BY MOUTH TWICE A DAY 140 tablet 0   Current Facility-Administered Medications on File Prior to Visit  Medication Dose Route Frequency Provider Last Rate Last Admin   cyanocobalamin ((VITAMIN B-12)) injection 1,000 mcg  1,000 mcg Intramuscular Q30 days [place], Natasha Burda G, MD   1,000 mcg at 02/26/22 1352    Past Medical History:  Diagnosis Date   Bilateral leg cramps    Bladder neoplasm    Cancer [facility])    CHF (congestive heart failure) (HCC)    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)    05/ 2018  chronic non-occlusive DVT RLE   COPD, frequent exacerbations (HCC)    03-06-2018 per pt last exacerbation 12/ 2018   Coronary artery disease    cardiologist-  dr Shirlee Latch-- 03-11-2018 er cath , 80% stenosis in the ostial second diagonal   Dyspnea on minimal exertion    Emphysema/COPD (HCC)    CAT score- 17   Family history of breast cancer    Family history of colon cancer    Family history of melanoma    Family history of ovarian cancer    Family history of pancreatic cancer    Fibromyalgia 1995   GERD (gastroesophageal reflux disease)    Hiatal hernia    History of breast cancer    History of diverticulitis of colon    2002  s/p  sigmoid colectomy   History of multiple pulmonary nodules    hx RUL nodules x2  s/p right VATS w/ wedge resection 09-11-2005 and 06-14-2011  both  necrotizing granulomatous inflammation w/ cystic area of necrosis & focal calcification   History of right breast cancer oncologist-  dr [name]-- no recurrence   dx 2010--  ID[facility],  Stage IA , ER/PR+,  (pT1c pN0) 11-09-2008 right lumpectomy;  12-18-2008 right simple mastectomy for DCIS margins;  completed chemotherapy 2010 (no radiation) and completed antiestrogen therapy   Hx of colonic polyps    Dr. 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   Nocturia    OA (osteoarthritis)    thumbs   On supplemental oxygen therapy    03-06-2018 per pt uses only at night,  checks O2 sats at home,  stated am sat 89% after moving around average 93-94% with RA   Osteoporosis    Peripheral arterial occlusive disease (HCC) vascular-- dr chen/ dr Randie Heinz   proximal right SFA severe focal stenosis with collaterals from the PFA/  04/ 2012 occluded celiac and SMA arteries with distal reconstitution w/ patent IMA   Peripheral vascular disease (HCC)    chronic DVT RLE,  mesenteric vascular disease   Right lower lobe pulmonary nodule    Chest CT 08-29-2017   Stage 3 severe COPD by GOLD classification (HCC) hx frequent exacerbations--   pulmologist-  dr Inocente Salles--  per lov note , dated 12-20-2017, oxyogen 2L is prescribed for use with exertion (but pt only uses mostly at night), O2 sats on RA run the 80s , this day sat 86% RA and with 2L O2 sat 92%   Varicose veins of leg with swelling    varicose vein surgery - Dr. Guss Bunde   Wears glasses    Wears hearing aid in both ears    Allergies  Allergen Reactions   Sinequan [Doxepin] Other (See Comments)    Kept her awake    Sulfa Antibiotics Swelling   Sulfur     Other reaction(s): Unknown    Social History  Socioeconomic History   Marital status: Married    Spouse name: Not on file   Number of children: 3   Years of education: 30   Highest education level: Not on file  Occupational History   Occupation: Magazine features editor: RETIRED    Comment: retired  Tobacco Use   Smoking status: Former    Packs/day: 0.75     Years: 50.00    Total pack years: 37.50    Types: Cigarettes    Quit date: 05/18/2016    Years since quitting: 6.1   Smokeless tobacco: Never  Vaping Use   Vaping Use: Never used  Substance and Sexual Activity   Alcohol use: Yes    Alcohol/week: 10.0 standard drinks of alcohol    Types: 10 Cans of beer per week   Drug use: No   Sexual activity: Not on file  Other Topics Concern   Not on file  Social History Narrative   HSG, beauty school. Married '69 -7 yrs/widowed; married '79 - 24yr/divorced; married '87.  2 sons - '70, '74, 1 srtep-son; 1 grandchild. 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 SO - good health.  NO history of abuse.  ACP - full code and all heroic measures.    Social Determinants of Health   Financial Resource Strain: Low Risk  (12/21/2020)   Overall Financial Resource Strain (CARDIA)    Difficulty of Paying Living Expenses: Not hard at all  Food Insecurity: No Food Insecurity (12/21/2020)   Hunger Vital Sign    Worried About Running Out of Food in the Last Year: Never true    Ran Out of Food in the Last Year: Never true  Transportation Needs: No Transportation Needs (12/21/2020)   PRAPARE - Administrator, Civil Service (Medical): No    Lack of Transportation (Non-Medical): No  Physical Activity: Inactive (12/21/2020)   Exercise Vital Sign    Days of Exercise per Week: 0 days    Minutes of Exercise per Session: 0 min  Stress: No Stress Concern Present (12/21/2020)   [employer] of Occupational Health - Occupational Stress Questionnaire    Feeling of Stress : Not at all  Social Connections: Socially Isolated (12/21/2020)   Social Connection and Isolation Panel [NHANES]    Frequency of Communication with Friends and Family: Once a week    Frequency of Social Gatherings with Friends and Family: Never    Attends Religious Services: Never    Database administrator or Organizations: No    Attends Banker Meetings: Never    Marital Status: Married    Vitals:   06/29/22 1350  BP: 120/70  Pulse: 90  Resp: 16  Temp: 97.7 F (36.5 C)  SpO2: 91%   Body mass index is 20.97 kg/m.  Physical Exam Vitals and nursing note reviewed.  Constitutional:      General: She is not in acute distress.    Appearance: She is well-developed and well-groomed.  HENT:     Head: Normocephalic and atraumatic.     Mouth/Throat:     Mouth: Mucous membranes are moist.     Pharynx: Oropharynx is clear.  Eyes:     Conjunctiva/sclera: Conjunctivae normal.  Neck:     Vascular: No JVD.  Cardiovascular:     Rate and Rhythm: Normal rate and regular rhythm.     Heart sounds: No murmur heard.    Comments: DP pulses palpable. Pulmonary:     Effort: Pulmonary effort is normal.  No respiratory distress.     Breath sounds: Decreased breath sounds and wheezing present. No rhonchi or rales.  Abdominal:     Palpations: Abdomen is soft. There is no mass.     Tenderness: There is no abdominal tenderness.  Musculoskeletal:     Right lower leg: No edema.     Left lower leg: No edema.  Lymphadenopathy:     Cervical: No cervical adenopathy.  Skin:    General: Skin is warm.     Findings: No erythema or rash.  Neurological:     General: No focal deficit present.     Mental Status: She is alert and oriented to person, place, and time.     Gait: Gait normal.     Comments: Mildly unstable gait, not assisted.  Psychiatric:        Mood and Affect: Affect normal. Mood is anxious.   ASSESSMENT AND PLAN:  Ms.Annahi was seen today for follow-up.  Diagnoses and all orders for this visit: Orders Placed This Encounter  Procedures   DG Chest 2 View   Basic metabolic panel   TSH   Vitamin B12   Brain Natriuretic Peptide   CBC   Ambulatory referral to Pain Clinic   Lab Results  Component Value Date   WBC 8.0 06/29/2022   HGB 13.9 06/29/2022   HCT 42.1 06/29/2022   MCV 94.0 06/29/2022   PLT 385.0 06/29/2022   Lab Results  Component Value Date   VITAMINB12  >1500 (H) 06/29/2022   Lab Results  Component Value Date   CREATININE 1.00 06/29/2022   BUN 20 06/29/2022   NA 136 06/29/2022   K 3.0 (L) 06/29/2022   CL 96 06/29/2022   CO2 29 06/29/2022   Lab Results  Component Value Date   TSH 1.37 06/29/2022   Insomnia, unspecified type Pain is interfering with sleep. No changes in Trazodone or Lorazepam dose.  Chronic right-sided low back pain with sciatica, sciatica laterality unspecified LE pain has improved but back pain is still severe. Explained that Hydrocodone-Acetaminophen is an opioid, there are some general guidelines, which include that it should be prescribed by same provider and who is treating problem. No significant improvement with epidural injection, pain may not get better, so I placed referral to pain clinic.  B12 deficiency Continue B12 1000 mcg IM q 4-5 weeks. Further recommendations according to B12 result.  -     cyanocobalamin (VITAMIN B12) injection 1,000 mcg  Hypothyroidism, unspecified type Last TSH 0.4, I would like goal to be closer to 2-3. Continue Levothyroxine 112 mcg daily, will adjust dose according to TSH result.  Hypokalemia Continue KLOR 20 meq daily. BMP will be obtained today. Side effects of furosemide discussed.  HFrEF (heart failure with reduced ejection fraction) (HCC) No signs of fluid overload on examination today. Last echo showed improvement of LVEF, in 05/2020 LVEF was 45-50%. Continue Furosemide 40 mg bid and low salt diet. BNP ordered today.  Anxiety disorder, unspecified type Stable. Continue Paroxetine 20 mg daily and Lorazepam 2 mg at bedtime. Side effects discussed.  COPD, frequent exacerbations (HCC) With acute exacerbation. Prednisone 40 mg daily x 5 d started today, she has tolerated well in the past, last taken 10/2021. Abx treatment started today. CXR ordered. No changes in Duoneb,Pulmicort, and Trelegy Ellipta.  Instructed about warning signs.  -     doxycycline  (VIBRA-TABS) 100 MG tablet; Take 1 tablet (100 mg total) by mouth 2 (two) times daily for 7 days. -  predniSONE (DELTASONE) 20 MG tablet; Take 2 tablets (40 mg total) by mouth daily with breakfast for 5 days.  I spent a total of 46 minutes in both face to face and non face to face activities for this visit on the date of this encounter. During this time history was obtained and documented, examination was performed, prior labs/imaging reviewed, and assessment/plan discussed.  Return in about 3 weeks (around 07/20/2022) for If symptoms resolved, in 5-6 months..  Kimball Manske G. [place], MD  [facility]. [place] office.

## 2022-06-29 ENCOUNTER — Encounter: Payer: Self-pay | Admitting: Family Medicine

## 2022-06-29 ENCOUNTER — Ambulatory Visit (INDEPENDENT_AMBULATORY_CARE_PROVIDER_SITE_OTHER): Payer: Medicare Other | Admitting: Family Medicine

## 2022-06-29 ENCOUNTER — Ambulatory Visit: Payer: Medicare Other

## 2022-06-29 ENCOUNTER — Other Ambulatory Visit: Payer: Self-pay | Admitting: Family Medicine

## 2022-06-29 ENCOUNTER — Other Ambulatory Visit: Payer: Self-pay | Admitting: Vascular Surgery

## 2022-06-29 ENCOUNTER — Ambulatory Visit (INDEPENDENT_AMBULATORY_CARE_PROVIDER_SITE_OTHER): Payer: Medicare Other

## 2022-06-29 VITALS — BP 120/70 | HR 90 | Temp 97.7°F | Resp 16 | Ht 65.0 in | Wt 126.0 lb

## 2022-06-29 DIAGNOSIS — G8929 Other chronic pain: Secondary | ICD-10-CM | POA: Diagnosis not present

## 2022-06-29 DIAGNOSIS — E538 Deficiency of other specified B group vitamins: Secondary | ICD-10-CM | POA: Diagnosis not present

## 2022-06-29 DIAGNOSIS — E876 Hypokalemia: Secondary | ICD-10-CM

## 2022-06-29 DIAGNOSIS — J441 Chronic obstructive pulmonary disease with (acute) exacerbation: Secondary | ICD-10-CM

## 2022-06-29 DIAGNOSIS — G4700 Insomnia, unspecified: Secondary | ICD-10-CM | POA: Diagnosis not present

## 2022-06-29 DIAGNOSIS — I5020 Unspecified systolic (congestive) heart failure: Secondary | ICD-10-CM | POA: Diagnosis not present

## 2022-06-29 DIAGNOSIS — K219 Gastro-esophageal reflux disease without esophagitis: Secondary | ICD-10-CM

## 2022-06-29 DIAGNOSIS — F17200 Nicotine dependence, unspecified, uncomplicated: Secondary | ICD-10-CM

## 2022-06-29 DIAGNOSIS — R0602 Shortness of breath: Secondary | ICD-10-CM | POA: Diagnosis not present

## 2022-06-29 DIAGNOSIS — R059 Cough, unspecified: Secondary | ICD-10-CM | POA: Diagnosis not present

## 2022-06-29 DIAGNOSIS — F419 Anxiety disorder, unspecified: Secondary | ICD-10-CM

## 2022-06-29 DIAGNOSIS — M5440 Lumbago with sciatica, unspecified side: Secondary | ICD-10-CM

## 2022-06-29 DIAGNOSIS — E039 Hypothyroidism, unspecified: Secondary | ICD-10-CM | POA: Diagnosis not present

## 2022-06-29 LAB — VITAMIN B12: Vitamin B-12: >1500 pg/mL — ABNORMAL HIGH (ref 211–911)

## 2022-06-29 LAB — CBC
HCT: 42.1 % (ref 36.0–46.0)
Hemoglobin: 13.9 g/dL (ref 12.0–15.0)
MCHC: 33.1 g/dL (ref 30.0–36.0)
MCV: 94 fl (ref 78.0–100.0)
Platelets: 385 10*3/uL (ref 150.0–400.0)
RBC: 4.48 Mil/uL (ref 3.87–5.11)
RDW: 14.2 % (ref 11.5–15.5)
WBC: 8 10*3/uL (ref 4.0–10.5)

## 2022-06-29 LAB — BASIC METABOLIC PANEL
BUN: 20 mg/dL (ref 6–23)
CO2: 29 mEq/L (ref 19–32)
Calcium: 10.2 mg/dL (ref 8.4–10.5)
Chloride: 96 mEq/L (ref 96–112)
Creatinine, Ser: 1 mg/dL (ref 0.40–1.20)
GFR: 54.3 mL/min — ABNORMAL LOW (ref >60.00)
Glucose, Bld: 109 mg/dL — ABNORMAL HIGH (ref 70–99)
Potassium: 3 mEq/L — ABNORMAL LOW (ref 3.5–5.1)
Sodium: 136 mEq/L (ref 135–145)

## 2022-06-29 LAB — BRAIN NATRIURETIC PEPTIDE: Pro B Natriuretic peptide (BNP): 37 pg/mL (ref 0.0–100.0)

## 2022-06-29 LAB — TSH: TSH: 1.37 u[IU]/mL (ref 0.35–5.50)

## 2022-06-29 MED ORDER — PREDNISONE 20 MG PO TABS: 40.0000 mg | ORAL_TABLET | Freq: Every day | ORAL | 0 refills | Status: AC

## 2022-06-29 MED ORDER — DOXYCYCLINE HYCLATE 100 MG PO TABS: 100.0000 mg | ORAL_TABLET | Freq: Two times a day (BID) | ORAL | 0 refills | Status: AC

## 2022-06-29 MED ORDER — CYANOCOBALAMIN 1000 MCG/ML IJ SOLN
1000.0000 ug | Freq: Once | INTRAMUSCULAR | Status: AC
  Administered 2022-06-29: 1000 ug via INTRAMUSCULAR

## 2022-06-29 NOTE — Patient Instructions (Addendum)
A few things to remember from today's visit:   Insomnia, unspecified type  Anxiety disorder, unspecified type  B12 deficiency - Plan: cyanocobalamin (VITAMIN B12) injection 1,000 mcg, Vitamin B12  Hypothyroidism, unspecified type - Plan: TSH  Hypokalemia - Plan: Basic metabolic panel  HFrEF (heart failure with reduced ejection fraction) ([place]) - Plan: Brain Natriuretic Peptide  Chronic right-sided low back pain with sciatica, sciatica laterality unspecified - Plan: Ambulatory referral to Pain Clinic  COPD, frequent exacerbations ([place]) - Plan: doxycycline (VIBRA-TABS) 100 MG tablet, predniSONE (DELTASONE) 20 MG tablet, DG Chest 2 View, CBC  If you need refills for medications you take chronically, please call your pharmacy. Do not use My Chart to request refills or for acute issues that need immediate attention. If you send a my chart message, it may take a few days to be addressed, specially if I am not in the office.  Please be sure medication list is accurate. If a new problem present, please set up appointment sooner than planned today.  Pain medication is a controlled medication that needs to be prescribed by same provider. I placed a referral to pain clinic. Fall precautions.  Because worsening respiratory symptoms, I am starting antibiotics and Prednisone today. Please call Dr Janee Morn office to schedule follow up appt. If symptoms get suddenly worse, you need to go to the ER. I will see you back in 2-3 weeks, before if needed. If symptoms are back to baseline, in 5-6 months. No changes in Lorazepam,Paxil,or Trazodone.

## 2022-07-02 ENCOUNTER — Telehealth: Payer: Self-pay

## 2022-07-02 NOTE — Telephone Encounter (Signed)
[name] needs a lab appointment for next week, doesn't need to be fasting, thank you!

## 2022-07-07 ENCOUNTER — Other Ambulatory Visit: Payer: Self-pay | Admitting: Family Medicine

## 2022-07-07 DIAGNOSIS — F419 Anxiety disorder, unspecified: Secondary | ICD-10-CM

## 2022-07-07 DIAGNOSIS — G4700 Insomnia, unspecified: Secondary | ICD-10-CM

## 2022-07-09 ENCOUNTER — Other Ambulatory Visit (INDEPENDENT_AMBULATORY_CARE_PROVIDER_SITE_OTHER): Payer: Medicare Other

## 2022-07-09 ENCOUNTER — Telehealth: Payer: Self-pay | Admitting: Family Medicine

## 2022-07-09 DIAGNOSIS — E876 Hypokalemia: Secondary | ICD-10-CM | POA: Diagnosis not present

## 2022-07-09 LAB — POTASSIUM: Potassium: 3.5 mEq/L (ref 3.5–5.1)

## 2022-07-09 NOTE — Telephone Encounter (Signed)
Patient is requesting for Dr. Doug Sou nurse to call her back.  She is wanting to discuss her B12 injections--is she suppoed to still be getting them?

## 2022-07-09 NOTE — Telephone Encounter (Signed)
Last filled 05/25/22

## 2022-07-09 NOTE — Telephone Encounter (Signed)
I spoke with patient. She is to continue B12 injections every 8 weeks. Appointment made for 10/16 at 2pm.

## 2022-07-10 DIAGNOSIS — M47816 Spondylosis without myelopathy or radiculopathy, lumbar region: Secondary | ICD-10-CM | POA: Diagnosis not present

## 2022-07-10 NOTE — Telephone Encounter (Signed)
According to PMP she has 3 refills left at her pharmacy. [facility]

## 2022-07-21 DIAGNOSIS — J449 Chronic obstructive pulmonary disease, unspecified: Secondary | ICD-10-CM | POA: Diagnosis not present

## 2022-07-21 DIAGNOSIS — J9601 Acute respiratory failure with hypoxia: Secondary | ICD-10-CM | POA: Diagnosis not present

## 2022-07-21 DIAGNOSIS — R0602 Shortness of breath: Secondary | ICD-10-CM | POA: Diagnosis not present

## 2022-07-22 DIAGNOSIS — J449 Chronic obstructive pulmonary disease, unspecified: Secondary | ICD-10-CM | POA: Diagnosis not present

## 2022-07-30 ENCOUNTER — Other Ambulatory Visit: Payer: Self-pay | Admitting: Family Medicine

## 2022-07-30 ENCOUNTER — Ambulatory Visit (INDEPENDENT_AMBULATORY_CARE_PROVIDER_SITE_OTHER): Payer: Medicare Other

## 2022-07-30 DIAGNOSIS — E538 Deficiency of other specified B group vitamins: Secondary | ICD-10-CM

## 2022-07-30 DIAGNOSIS — Z23 Encounter for immunization: Secondary | ICD-10-CM

## 2022-07-30 NOTE — Progress Notes (Signed)
Pt here for monthly B12 injection per Dr [place].  B12 1020mcg given IM left deltoid and pt tolerated injection well.  Next B12 injection scheduled for 08/29/22.

## 2022-08-02 DIAGNOSIS — M47816 Spondylosis without myelopathy or radiculopathy, lumbar region: Secondary | ICD-10-CM | POA: Diagnosis not present

## 2022-08-02 DIAGNOSIS — M4316 Spondylolisthesis, lumbar region: Secondary | ICD-10-CM | POA: Diagnosis not present

## 2022-08-06 ENCOUNTER — Other Ambulatory Visit: Payer: Self-pay | Admitting: Family Medicine

## 2022-08-09 ENCOUNTER — Encounter: Payer: Self-pay | Admitting: Physician Assistant

## 2022-08-09 ENCOUNTER — Ambulatory Visit: Payer: Medicare Other | Attending: Physician Assistant | Admitting: Physician Assistant

## 2022-08-09 VITALS — BP 98/58 | HR 65 | Ht 65.0 in | Wt 127.0 lb

## 2022-08-09 DIAGNOSIS — E785 Hyperlipidemia, unspecified: Secondary | ICD-10-CM | POA: Diagnosis not present

## 2022-08-09 DIAGNOSIS — I2510 Atherosclerotic heart disease of native coronary artery without angina pectoris: Secondary | ICD-10-CM | POA: Diagnosis not present

## 2022-08-09 DIAGNOSIS — I739 Peripheral vascular disease, unspecified: Secondary | ICD-10-CM | POA: Diagnosis not present

## 2022-08-09 DIAGNOSIS — J449 Chronic obstructive pulmonary disease, unspecified: Secondary | ICD-10-CM

## 2022-08-09 DIAGNOSIS — I5022 Chronic systolic (congestive) heart failure: Secondary | ICD-10-CM

## 2022-08-09 DIAGNOSIS — I10 Essential (primary) hypertension: Secondary | ICD-10-CM | POA: Diagnosis not present

## 2022-08-09 NOTE — Progress Notes (Signed)
Cardiology Office Note:    Date:  08/09/2022   ID:  Christy, [name] [birth year], MRN [id number]  PCP:  [name], [name], MD  [facility] Cardiologist:  Freada Bergeron, MD  [facility] Electrophysiologist:  None   Chief Complaint: 27-month follow-up  History of Present Illness:    [name] is a 76 y.o. female with a hx of CAD, hypertension, COPD on 2L oxygen at night, right breast cancer, hyperlipidemia, peripheral arterial disease and chronic mesenteric ischemia seen for follow-up.  She had a mesenteric angiogram done in 02/2011 by Dr. Bridgett Larsson, showing occluded celiac and SMA arteries with patent IMA.  She was planned for aorto-mesenteric bypass. She was referred for cardiology evaluation prior to surgery. However, it was ultimately decided not to treat her mesenteric disease surgically.    Given her exertional chest tightness and shortness of breath as well as known vascular disease, Dr. Aundra Dubin set her up for left heart cath.  This was done in 02/2011 and showed a 70-80% ostial stenosis of a small to moderate 2nd diagonal.  This was too small to intervene on and not likely to be particularly symptomatic.  Lower extremity arterial dopplers showed a severe focal proximal right SFA stenosis with collaterals from the PFA. She had a VATS for a right-sided lung nodule that showed granulomatous inflammation (no cancer).  She quit smoking for a short time.  She saw Dr. Fletcher Anon for PAD evaluation, and it was decided to treat her medically.    Was hospitalized in 05/2020 with worsening DOE.  Echo showed reduced EF at 45-50%. Seen by cardiology and underwent coronary angiography which was negative for any obstructive disease. She was started on Paxil given significant depression.   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 Donzetta Matters.   Last echo 07/2021 with improved LVEF to 55-60% and grade 1 DD.   Patient is here  for follow-up.  No complaints except back issue.  She has chronic dyspnea with activity.  She uses 2 L oxygen at night.  Patient is required to use during daytime when sick or doing heavy exertional activity.  No chest pain, palpitation, dizziness, orthopnea, PND, syncope or lower extremity edema or melena.  Past Medical History:  Diagnosis Date   Bilateral leg cramps    Bladder neoplasm    Cancer (HCC)    CHF (congestive heart failure) (HCC)    Chronic deep vein thrombosis (DVT) of right lower extremity ([place])    05/ 2018  chronic non-occlusive DVT RLE   COPD, frequent exacerbations ([place])    03-06-2018 per pt last exacerbation 12/ 2018   Coronary artery disease    cardiologist-  dr Aundra Dubin-- 03-11-2018 er cath , 80% stenosis in the ostial second diagonal   Dyspnea on minimal exertion    Emphysema/COPD ([place])    CAT score- 17   Family history of breast cancer    Family history of colon cancer    Family history of melanoma    Family history of ovarian cancer    Family history of pancreatic cancer    Fibromyalgia 1995   GERD (gastroesophageal reflux disease)    Hiatal hernia    History of breast cancer    History of diverticulitis of colon    2002  s/p  sigmoid colectomy   History of multiple pulmonary nodules    hx RUL nodules x2  s/p right VATS w/ wedge resection  09-11-2005 and 06-14-2011  both  necrotizing granulomatous inflammation w/ cystic area of necrosis & focal calcification   History of right breast cancer oncologist-  dr Jana Hakim-- no recurrence   dx 2010--  IDC, Stage IA , ER/PR+,  (pT1c pN0) 11-09-2008 right lumpectomy;  12-18-2008 right simple mastectomy for DCIS margins;  completed chemotherapy 2010 (no radiation) and completed antiestrogen therapy   Hx of colonic polyps    Dr. 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   Nocturia    OA (osteoarthritis)    thumbs    On supplemental oxygen therapy    03-06-2018 per pt uses only at night,  checks O2 sats at home,  stated am sat 89% after moving around average 93-94% with RA   Osteoporosis    Peripheral arterial occlusive disease ([place]) vascular-- dr chen/ dr Donzetta Matters   proximal right SFA severe focal stenosis with collaterals from the PFA/  04/ 2012 occluded celiac and SMA arteries with distal reconstitution w/ patent IMA   Peripheral vascular disease ([place])    chronic DVT RLE,  mesenteric vascular disease   Right lower lobe pulmonary nodule    Chest CT 08-29-2017   Stage 3 severe COPD by GOLD classification ([place]) hx frequent exacerbations--   pulmologist-  dr Maryann Alar--  per lov note , dated 12-20-2017, oxyogen 2L is prescribed for use with exertion (but pt only uses mostly at night), O2 sats on RA run the 80s , this day sat 86% RA and with 2L O2 sat 92%   Varicose veins of leg with swelling    varicose vein surgery - Dr. Aleda Grana   Wears glasses    Wears hearing aid in both ears     Past Surgical History:  Procedure Laterality Date   ABDOMINAL AORTOGRAM N/A 07/11/2020   Procedure: ABDOMINAL AORTOGRAM;  Surgeon: Waynetta Sandy, MD;  Location: [place] [facility];  Service: Cardiovascular;  Laterality: N/A;   AORTIC ARCH ANGIOGRAPHY N/A 07/11/2020   Procedure: AORTIC ARCH ANGIOGRAPHY;  Surgeon: Waynetta Sandy, MD;  Location: [place] [facility];  Service: Cardiovascular;  Laterality: N/A;   BREAST EXCISIONAL BIOPSY Left 10/15/2008   BREAST LUMPECTOMY W/ NEEDLE LOCALIZATION Right 11-09-2008    dr cornett   [place]  03-12-2011   dr Aundra Dubin   70-80% ostial stenosis in small second diagonal (appears to small for intervention),  dLAD 40-50%,  minimal luminal irregulartieis involoving LCFx and RCA,  LVEF 55%   CATARACT EXTRACTION W/ INTRAOCULAR LENS  IMPLANT, BILATERAL  2011   CYSTOSCOPY N/A 03/11/2018   Procedure: CYSTOSCOPY WITH INSTILLATION OF POST OPERATIVE  EPIRUBICIN;  Surgeon: Festus Aloe, MD;  Location: [facility];  Service: Urology;  Laterality: N/A;   LEFT HEART CATH AND CORONARY ANGIOGRAPHY N/A 05/25/2020   Procedure: LEFT HEART CATH AND CORONARY ANGIOGRAPHY;  Surgeon: Troy Sine, MD;  Location: [place] [facility];  Service: Cardiovascular;  Laterality: N/A;   MASTECTOMY Right    PARTIAL COLECTOMY  2002   sigmoid and appectomy (diverticulitis)   PARTIAL MASTECTOMY WITH NEEDLE LOCALIZATION Left 02/23/2013   Procedure:  LEFT PARTIAL MASTECTOMY WITH NEEDLE LOCALIZATION;  Surgeon: Adin Hector, MD;  Location: [place];  Service: General;  Laterality: Left;   PERIPHERAL VASCULAR INTERVENTION  07/11/2020   Procedure: PERIPHERAL VASCULAR INTERVENTION;  Surgeon: Waynetta Sandy, MD;  Location: [place] [facility];  Service: Cardiovascular;;   RECONSTRUCTION BREAST W/ LATISSIMUS DORSI FLAP Right 05-30-2009   dr Harlow Mares  [facility]   REDUCTION MAMMAPLASTY Left    SIMPLE MASTECTOMY Right 12-28-2008    dr cornett  [facility]   TRANSTHORACIC ECHOCARDIOGRAM  07-09-2016  dr Aundra Dubin   ef 55-60%, grade 1 diastoic dysfunction/  mild TR   TRANSURETHRAL RESECTION OF BLADDER TUMOR N/A 03/11/2018   Procedure: TRANSURETHRAL RESECTION OF BLADDER TUMOR (TURBT) 2-5cm;  Surgeon: Festus Aloe, MD;  Location: [facility];  Service: Urology;  Laterality: N/A;   UPPER EXTREMITY ANGIOGRAPHY Left 07/11/2020   Procedure: Upper Extremity Angiography;  Surgeon: Waynetta Sandy, MD;  Location: [place] [facility];  Service: Cardiovascular;  Laterality: Left;   VAGINAL HYSTERECTOMY  1973   VIDEO ASSISTED THORACOSCOPY (VATS)/WEDGE RESECTION Right 09-11-2005  &  06-14-2011   dr Fara Boros  [facility]   both RUL    Current Medications: Current Meds  Medication Sig   albuterol (VENTOLIN HFA) 108 (90 Base) MCG/ACT inhaler TAKE 2 PUFFS BY MOUTH EVERY 6 HOURS AS NEEDED FOR WHEEZE OR SHORTNESS OF BREATH   aspirin EC 81 MG tablet Take 1 tablet (81 mg total) by  mouth daily.   atorvastatin (LIPITOR) 80 MG tablet Take 1 tablet (80 mg total) by mouth daily.   budesonide (PULMICORT) 0.5 MG/2ML nebulizer solution Take 2 mLs (0.5 mg total) by nebulization 2 (two) times daily.   cilostazol (PLETAL) 100 MG tablet Take 1 tablet (100 mg total) by mouth 2 (two) times daily.   clopidogrel (PLAVIX) 75 MG tablet TAKE 1 TABLET BY MOUTH EVERY DAY   DUPIXENT 300 MG/2ML SOPN Inject 300 mg into the skin every 14 (fourteen) days. Every other Friday   ezetimibe (ZETIA) 10 MG tablet Take 1 tablet (10 mg total) by mouth daily.   ferrous sulfate 325 (65 FE) MG tablet TAKE 1 TABLET BY MOUTH EVERY DAY WITH BREAKFAST   fluticasone (FLONASE) 50 MCG/ACT nasal spray USE 1 SPRAY INTO EACH NOSTRIL TWICE A DAY   Fluticasone-Umeclidin-Vilant (TRELEGY ELLIPTA) 200-62.5-25 MCG/ACT AEPB TAKE 1 PUFF BY MOUTH EVERY DAY   furosemide (LASIX) 40 MG tablet TAKE 1 TABLET BY MOUTH TWICE  DAILY   gabapentin (NEURONTIN) 300 MG capsule Take 300 mg by mouth 4 (four) times daily as needed (for nerve pain).   HYDROcodone-acetaminophen (NORCO/VICODIN) 5-325 MG tablet Take 1 tablet by mouth at bedtime.   hydrOXYzine (ATARAX/VISTARIL) 25 MG tablet Take 25 mg by mouth daily as needed for itching.   ipratropium-albuterol (DUONEB) 0.5-2.5 (3) MG/3ML SOLN Inhale 3 mLs into the lungs as directed. TID for 1 week and then use as needed Q6 hours.   KLOR-CON M20 20 MEQ tablet TAKE 1 TABLET BY MOUTH THREE TIMES A DAY   levothyroxine (SYNTHROID) 112 MCG tablet TAKE 1 TABLET BY MOUTH EVERY DAY IN THE MORNING BEFORE BREAKFAST   LORazepam (ATIVAN) 2 MG tablet TAKE 1 TABLET BY MOUTH EVERYDAY AT BEDTIME   montelukast (SINGULAIR) 10 MG tablet TAKE 1 TABLET BY MOUTH EVERYDAY AT BEDTIME   pantoprazole (PROTONIX) 20 MG tablet TAKE 1 TABLET BY MOUTH EVERYDAY AT BEDTIME   PARoxetine (PAXIL) 20 MG tablet TAKE 1 TABLET BY MOUTH EVERY DAY   tamsulosin (FLOMAX) 0.4 MG CAPS capsule TAKE 1 CAPSULE BY MOUTH DAILY   traZODone  (DESYREL) 100 MG tablet TAKE 1 TABLET BY MOUTH EVERYDAY AT BEDTIME   Current Facility-Administered Medications for the 08/09/22 encounter (Office Visit) with Leanor Kail, PA  Medication  cyanocobalamin ((VITAMIN B-12)) injection 1,000 mcg     Allergies:   S[place] [doxepin], Sulfa antibiotics, and Sulfur   Social History   Socioeconomic History   Marital status: Married    Spouse name: Not on file   Number of children: 3   Years of education: 12   Highest education level: Not on file  Occupational History   Occupation: Chiropractor: RETIRED    Comment: retired  Tobacco Use   Smoking status: Former    Packs/day: 0.75    Years: 50.00    Total pack years: 37.50    Types: Cigarettes    Quit date: 05/18/2016    Years since quitting: 6.2   Smokeless tobacco: Never  Vaping Use   Vaping Use: Never used  Substance and Sexual Activity   Alcohol use: Yes    Alcohol/week: 10.0 standard drinks of alcohol    Types: 10 Cans of beer per week   Drug use: No   Sexual activity: Not on file  Other Topics Concern   Not on file  Social History Narrative   HSG, beauty school. Married '69 -55 yrs/widowed; married '79 - 40yr/divorced; married '87.  2 sons - '70, '74, 1 srtep-son; 1 grandchild. 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 SO - good health.  NO history of abuse.  ACP - full code and all heroic measures.    Social Determinants of Health   Financial Resource Strain: Low Risk  (12/21/2020)   Overall Financial Resource Strain (CARDIA)    Difficulty of Paying Living Expenses: Not hard at all  Food Insecurity: No Food Insecurity (12/21/2020)   Hunger Vital Sign    Worried About Running Out of Food in the Last Year: Never true    Ran Out of Food in the Last Year: Never true  Transportation Needs: No Transportation Needs (12/21/2020)   PRAPARE - Hydrologist (Medical): No    Lack of Transportation (Non-Medical): No  Physical Activity: Inactive  (12/21/2020)   Exercise Vital Sign    Days of Exercise per Week: 0 days    Minutes of Exercise per Session: 0 min  Stress: No Stress Concern Present (12/21/2020)   [place]    Feeling of Stress : Not at all  Social Connections: Socially Isolated (12/21/2020)   Social Connection and Isolation Panel [NHANES]    Frequency of Communication with Friends and Family: Once a week    Frequency of Social Gatherings with Friends and Family: Never    Attends Religious Services: Never    Printmaker: No    Attends Music therapist: Never    Marital Status: Married     Family History: The patient's family history includes Breast cancer (age of onset: 51) in her niece; COPD in her mother; Cancer in her sister; Colon cancer in her mother; Coronary artery disease in her brother and father; Diabetes in her mother; Emphysema in her mother; Heart attack in her brother and father; Heart disease in her father; Hypertension in her mother, sister, and sister; Melanoma in her niece and sister; Ovarian cancer in her sister; Pancreatic cancer in her sister; Sudden death in her father; Throat cancer in her sister.    ROS:   Please see the history of present illness.    All other systems reviewed and are negative.   EKGs/Labs/Other Studies Reviewed:    The following studies were reviewed  today:  Echo 07/2021 1. Left ventricular ejection fraction, by estimation, is 55 to 60%. The  left ventricle has normal function. The left ventricle has no regional  wall motion abnormalities. Left ventricular diastolic parameters are  consistent with Grade II diastolic  dysfunction (pseudonormalization).   2. Right ventricular systolic function is normal. The right ventricular  size is normal. Tricuspid regurgitation signal is inadequate for assessing  PA pressure.   3. The mitral valve is normal in structure. No evidence  of mitral valve  regurgitation. No evidence of mitral stenosis.   4. The aortic valve is normal in structure. Aortic valve regurgitation is  not visualized. No aortic stenosis is present.   5. The inferior vena cava is dilated in size with >50% respiratory  variability, suggesting right atrial pressure of 8 mmHg.   Comparison(s): Compared to prior echo in 05/21/2020, there has been  improvement in the Left ventricular ejection fraction was 45-50% now  55-60%.   Carotid doppler 06/2020 Summary:  Right Carotid: Velocities in the right ICA are consistent with a 1-39%  stenosis.   Left Carotid: Velocities in the left ICA are consistent with a 1-39%  stenosis.   Vertebrals:  Right vertebral artery demonstrates antegrade flow. Left  vertebral               artery demonstrates retrograde flow.  Subclavians: Normal flow hemodynamics were seen in the right subclavian  artery.               Left subclavian waveform monophasic- indicative of a more  proximal               obstruction.   LEFT HEART CATH AND CORONARY ANGIOGRAPHY 05/2020   Conclusion  No significant coronary obstructive disease.  The left main is short and immediately trifurcates into a large LAD, ramus immediate and left circumflex vessel.  The midportion of the LAD dips intramyocardially slightly but there is no evidence for systolic bridging.  The RCA is a dominant vessel that has calcification without obstructive stenosis.   Hyperdynamic LV function with EF estimated at 65%.  LVEDP 10 mmHg.   RECOMMENDATION: Medical therapy.  EKG:  EKG is not  ordered today.    Recent Labs: 11/01/2021: B Natriuretic Peptide 123.9 11/07/2021: ALT 21 12/04/2021: Magnesium 1.9 06/29/2022: BUN 20; Creatinine, Ser 1.00; Hemoglobin 13.9; Platelets 385.0; Pro B Natriuretic peptide (BNP) 37.0; Sodium 136; TSH 1.37 07/09/2022: Potassium 3.5  Recent Lipid Panel    Component Value Date/Time   CHOL 145 12/26/2021 1141   TRIG 57 12/26/2021 1141    TRIG 70 08/28/2006 0847   HDL 93 12/26/2021 1141   CHOLHDL 1.6 12/26/2021 1141   CHOLHDL 1.8 11/06/2021 0523   VLDL 10 11/06/2021 0523   LDLCALC 40 12/26/2021 1141   LDLDIRECT 88.0 08/18/2013 1001    Physical Exam:    VS:  BP (!) 98/58   Pulse 65   Ht 5\' 5"  (1.651 m)   Wt 127 lb (57.6 kg)   SpO2 96%   BMI 21.13 kg/m     Wt Readings from Last 3 Encounters:  08/09/22 127 lb (57.6 kg)  06/29/22 126 lb (57.2 kg)  01/29/22 129 lb 4 oz (58.6 kg)     GEN:  Well nourished, well developed in no acute distress HEENT: Normal NECK: No JVD; No carotid bruits LYMPHATICS: No lymphadenopathy CARDIAC: RRR, no murmurs, rubs, gallops RESPIRATORY: Diffuse wheezing  ABDOMEN: Soft, non-tender, non-distended MUSCULOSKELETAL:  No edema; No deformity  SKIN: Warm and dry NEUROLOGIC:  Alert and oriented x 3 PSYCHIATRIC:  Normal affect   ASSESSMENT AND PLAN:    Nonobstructive CAD No angina.  Remained on aspirin, Plavix and cilostazol (This is due to subclavian stent 06/2020).  Continue statin and Zetia.  2. Heart Failure with Improved EF from 45-50% (05/2020) >55-60% (07/2021) Euvolemic.  Continue Lasix at current dose.  3. HTN Blood pressure soft today.  Reports normal at home.  Not on antihypertensive chronically.  4. HLD Continue Lipitor and Zetia  5. PVD - followed by Dr. Donzetta Matters   6. Mild carotid artery stenosis -Last Doppler study 06/2020 with 1 to 39% bilateral stenosis -Continue current therapy  Medication Adjustments/Labs and Tests Ordered: Current medicines are reviewed at length with the patient today.  Concerns regarding medicines are outlined above.  No orders of the defined types were placed in this encounter.  No orders of the defined types were placed in this encounter.   Patient Instructions  Medication Instructions:  Your physician recommends that you continue on your current medications as directed. Please refer to the Current Medication list given to you  today. *If you need a refill on your cardiac medications before your next appointment, please call your pharmacy*   Lab Work: None Ordered   Testing/Procedures: None Ordered   Follow-Up: At [facility], you and your health needs are our priority.  As part of our continuing mission to provide you with exceptional heart care, we have created designated Provider Care Teams.  These Care Teams include your primary Cardiologist (physician) and Advanced Practice Providers (APPs -  Physician Assistants and Nurse Practitioners) who all work together to provide you with the care you need, when you need it.  We recommend signing up for the patient portal called "MyChart".  Sign up information is provided on this After Visit Summary.  MyChart is used to connect with patients for Virtual Visits (Telemedicine).  Patients are able to view lab/test results, encounter notes, upcoming appointments, etc.  Non-urgent messages can be sent to your provider as well.   To learn more about what you can do with MyChart, go to [email or web address].    Your next appointment:   6 month(s)  The format for your next appointment:   In Person  Provider:   Freada Bergeron, MD     Other Instructions   Important Information About Sugar         Signed, Leanor Kail, [place]  08/09/2022 2:42 PM    [place]

## 2022-08-09 NOTE — Patient Instructions (Signed)
Medication Instructions:  Your physician recommends that you continue on your current medications as directed. Please refer to the Current Medication list given to you today. *If you need a refill on your cardiac medications before your next appointment, please call your pharmacy*   Lab Work: None Ordered   Testing/Procedures: None Ordered   Follow-Up: At [facility], you and your health needs are our priority.  As part of our continuing mission to provide you with exceptional heart care, we have created designated Provider Care Teams.  These Care Teams include your primary Cardiologist (physician) and Advanced Practice Providers (APPs -  Physician Assistants and Nurse Practitioners) who all work together to provide you with the care you need, when you need it.  We recommend signing up for the patient portal called "MyChart".  Sign up information is provided on this After Visit Summary.  MyChart is used to connect with patients for Virtual Visits (Telemedicine).  Patients are able to view lab/test results, encounter notes, upcoming appointments, etc.  Non-urgent messages can be sent to your provider as well.   To learn more about what you can do with MyChart, go to [email or web address].    Your next appointment:   6 month(s)  The format for your next appointment:   In Person  Provider:   Freada Bergeron, MD     Other Instructions   Important Information About Sugar

## 2022-08-14 ENCOUNTER — Other Ambulatory Visit: Payer: Self-pay | Admitting: *Deleted

## 2022-08-14 DIAGNOSIS — I2510 Atherosclerotic heart disease of native coronary artery without angina pectoris: Secondary | ICD-10-CM

## 2022-08-14 DIAGNOSIS — E7800 Pure hypercholesterolemia, unspecified: Secondary | ICD-10-CM

## 2022-08-14 DIAGNOSIS — I10 Essential (primary) hypertension: Secondary | ICD-10-CM

## 2022-08-14 MED ORDER — ATORVASTATIN CALCIUM 80 MG PO TABS: 80.0000 mg | ORAL_TABLET | Freq: Every day | ORAL | 3 refills | Status: DC

## 2022-08-20 DIAGNOSIS — M47816 Spondylosis without myelopathy or radiculopathy, lumbar region: Secondary | ICD-10-CM | POA: Diagnosis not present

## 2022-08-21 DIAGNOSIS — R0602 Shortness of breath: Secondary | ICD-10-CM | POA: Diagnosis not present

## 2022-08-21 DIAGNOSIS — J449 Chronic obstructive pulmonary disease, unspecified: Secondary | ICD-10-CM | POA: Diagnosis not present

## 2022-08-21 DIAGNOSIS — J9601 Acute respiratory failure with hypoxia: Secondary | ICD-10-CM | POA: Diagnosis not present

## 2022-08-22 DIAGNOSIS — L821 Other seborrheic keratosis: Secondary | ICD-10-CM | POA: Diagnosis not present

## 2022-08-22 DIAGNOSIS — Z79899 Other long term (current) drug therapy: Secondary | ICD-10-CM | POA: Diagnosis not present

## 2022-08-22 DIAGNOSIS — L2084 Intrinsic (allergic) eczema: Secondary | ICD-10-CM | POA: Diagnosis not present

## 2022-08-22 DIAGNOSIS — J449 Chronic obstructive pulmonary disease, unspecified: Secondary | ICD-10-CM | POA: Diagnosis not present

## 2022-08-29 ENCOUNTER — Ambulatory Visit (INDEPENDENT_AMBULATORY_CARE_PROVIDER_SITE_OTHER): Payer: Medicare Other

## 2022-08-29 DIAGNOSIS — E538 Deficiency of other specified B group vitamins: Secondary | ICD-10-CM

## 2022-08-29 NOTE — Progress Notes (Signed)
Pt here for monthly B12 injection per Dr Martinique.  B12 1067mcg given IM left deltoid and pt tolerated injection well.  Next B12 injection scheduled for 09/28/22.

## 2022-08-31 ENCOUNTER — Other Ambulatory Visit: Payer: Self-pay | Admitting: Family Medicine

## 2022-08-31 DIAGNOSIS — K219 Gastro-esophageal reflux disease without esophagitis: Secondary | ICD-10-CM

## 2022-09-11 ENCOUNTER — Other Ambulatory Visit: Payer: Self-pay

## 2022-09-11 DIAGNOSIS — I739 Peripheral vascular disease, unspecified: Secondary | ICD-10-CM

## 2022-09-11 MED ORDER — CILOSTAZOL 100 MG PO TABS: 100.0000 mg | ORAL_TABLET | Freq: Two times a day (BID) | ORAL | 3 refills | Status: DC

## 2022-09-12 ENCOUNTER — Other Ambulatory Visit: Payer: Self-pay | Admitting: Family Medicine

## 2022-09-12 DIAGNOSIS — G4700 Insomnia, unspecified: Secondary | ICD-10-CM

## 2022-09-12 DIAGNOSIS — F419 Anxiety disorder, unspecified: Secondary | ICD-10-CM

## 2022-09-12 NOTE — Telephone Encounter (Signed)
Last filled 09/12/22

## 2022-09-18 ENCOUNTER — Other Ambulatory Visit: Payer: Self-pay

## 2022-09-18 DIAGNOSIS — E785 Hyperlipidemia, unspecified: Secondary | ICD-10-CM

## 2022-09-18 DIAGNOSIS — Z79899 Other long term (current) drug therapy: Secondary | ICD-10-CM

## 2022-09-18 DIAGNOSIS — I2510 Atherosclerotic heart disease of native coronary artery without angina pectoris: Secondary | ICD-10-CM

## 2022-09-18 DIAGNOSIS — I10 Essential (primary) hypertension: Secondary | ICD-10-CM

## 2022-09-18 MED ORDER — EZETIMIBE 10 MG PO TABS: 10.0000 mg | ORAL_TABLET | Freq: Every day | ORAL | 1 refills | Status: DC

## 2022-09-19 DIAGNOSIS — M4316 Spondylolisthesis, lumbar region: Secondary | ICD-10-CM | POA: Diagnosis not present

## 2022-09-19 DIAGNOSIS — M5416 Radiculopathy, lumbar region: Secondary | ICD-10-CM | POA: Diagnosis not present

## 2022-09-20 ENCOUNTER — Other Ambulatory Visit: Payer: Self-pay | Admitting: Family Medicine

## 2022-09-20 DIAGNOSIS — J9601 Acute respiratory failure with hypoxia: Secondary | ICD-10-CM | POA: Diagnosis not present

## 2022-09-20 DIAGNOSIS — E876 Hypokalemia: Secondary | ICD-10-CM

## 2022-09-20 DIAGNOSIS — R0602 Shortness of breath: Secondary | ICD-10-CM | POA: Diagnosis not present

## 2022-09-20 DIAGNOSIS — J449 Chronic obstructive pulmonary disease, unspecified: Secondary | ICD-10-CM | POA: Diagnosis not present

## 2022-09-21 DIAGNOSIS — J449 Chronic obstructive pulmonary disease, unspecified: Secondary | ICD-10-CM | POA: Diagnosis not present

## 2022-09-24 ENCOUNTER — Other Ambulatory Visit: Payer: Self-pay | Admitting: Family Medicine

## 2022-09-26 ENCOUNTER — Other Ambulatory Visit: Payer: Self-pay | Admitting: Neurological Surgery

## 2022-09-28 ENCOUNTER — Ambulatory Visit: Payer: Medicare Other

## 2022-10-04 ENCOUNTER — Telehealth: Payer: Self-pay | Admitting: *Deleted

## 2022-10-04 NOTE — Telephone Encounter (Signed)
   Pre-operative Risk Assessment    Patient Name: [name]  DOB: 09/17/1946 MRN: [id number]      Request for Surgical Clearance    Procedure:   LEFT L3-4 MINIMALLY INVASIVE TRANSFORAMINAL LUMBER INTERBODY FUSION   Date of Surgery:  Clearance 10/22/22                                 Surgeon:  DR. Emelda Brothers Surgeon's Group or Practice Name:  [facility] Phone number:  [phone] Fax number:  [phone]   Type of Clearance Requested:   - Pharmacy:  Hold Aspirin and Clopidogrel (Plavix) NOT INDICATED   Type of Anesthesia:  General    Additional requests/questions:    Astrid Divine   10/04/2022, 3:19 PM

## 2022-10-04 NOTE — Telephone Encounter (Signed)
     Primary Cardiologist: Freada Bergeron, MD  Chart reviewed as part of pre-operative protocol coverage. Given past medical history and time since last visit, based on ACC/AHA guidelines, [name] would be at acceptable risk for the planned procedure without further cardiovascular testing.   Her RCRI is a class II risk, 0.9% risk of major cardiac event.  Her aspirin and Plavix are not prescribed by cardiology providers.  Recommendations for holding aspirin and Plavix will need to come from prescribing providers.  I will route this recommendation to the requesting party via Epic fax function and remove from pre-op pool.  Please call with questions.  Jossie Ng. Lashante Fryberger NP-C     10/04/2022, 3:48 PM [place] [address] Office [phone] Fax [phone]

## 2022-10-10 ENCOUNTER — Other Ambulatory Visit: Payer: Self-pay | Admitting: Family Medicine

## 2022-10-10 DIAGNOSIS — G4700 Insomnia, unspecified: Secondary | ICD-10-CM

## 2022-10-10 DIAGNOSIS — F419 Anxiety disorder, unspecified: Secondary | ICD-10-CM

## 2022-10-10 NOTE — Telephone Encounter (Signed)
Last filled 09/13/22

## 2022-10-13 ENCOUNTER — Other Ambulatory Visit: Payer: Self-pay | Admitting: Family Medicine

## 2022-10-17 NOTE — Progress Notes (Signed)
Surgical Instructions    Your procedure is scheduled on Monday, 10/22/22.  Report to [facility] Main Entrance "A" at 12:05 P.M., then check in with the Admitting office.  Call this number if you have problems the morning of surgery:  [phone]   If you have any questions prior to your surgery date call [phone]: Open Monday-Friday 8am-4pm If you experience any cold or flu symptoms such as cough, fever, chills, shortness of breath, etc. between now and your scheduled surgery, please notify us at the above number     Remember:  Do not eat after midnight the night before your surgery  You may drink clear liquids until 11:05am the morning of your surgery.   Clear liquids allowed are: Water, Non-Citrus Juices (without pulp), Carbonated Beverages, Clear Tea, Black Coffee ONLY (NO MILK, CREAM OR POWDERED CREAMER of any kind), and Gatorade    Take these medicines the morning of surgery with A SIP OF WATER:  atorvastatin (LIPITOR)  ezetimibe (ZETIA)  fluticasone (FLONASE)  Fluticasone-Umeclidin-Vilant (TRELEGY ELLIPTA)  HYDROcodone-acetaminophen (NORCO/VICODIN)  levothyroxine (SYNTHROID)  pantoprazole (PROTONIX)  tamsulosin (FLOMAX)   IF NEEDED: albuterol (VENTOLIN HFA) - bring with you the day of surgery gabapentin (NEURONTIN)  ipratropium-albuterol (DUONEB)   As of today, STOP taking any Aspirin (unless otherwise instructed by your surgeon) Aleve, Naproxen, Ibuprofen, Motrin, Advil, Goody's, BC's, all herbal medications, fish oil, and all vitamins.  Please call your surgeons in regards to when to stop taking the following blood thinners prior to surgery:  -aspirin  -cilostazol (PLETAL)  -clopidogrel (PLAVIX)             Do not wear jewelry or makeup. Do not wear lotions, powders, perfumes or deodorant. Do not shave 48 hours prior to surgery.   Do not bring valuables to the hospital. Do not wear nail polish, gel polish, artificial nails, or any other type of covering on  natural nails (fingers and toes) If you have artificial nails or gel coating that need to be removed by a nail salon, please have this removed prior to surgery. Artificial nails or gel coating may interfere with anesthesia's ability to adequately monitor your vital signs.  [facility] is not responsible for any belongings or valuables.    Do NOT Smoke (Tobacco/Vaping)  24 hours prior to your procedure  If you use a CPAP at night, you may bring your mask for your overnight stay.   Contacts, glasses, hearing aids, dentures or partials may not be worn into surgery, please bring cases for these belongings   For patients admitted to the hospital, discharge time will be determined by your treatment team.   Patients discharged the day of surgery will not be allowed to drive home, and someone needs to stay with them for 24 hours.   SURGICAL WAITING ROOM VISITATION Patients having surgery or a procedure may have no more than 2 support people in the waiting area - these visitors may rotate.   Children under the age of 57 must have an adult with them who is not the patient. If the patient needs to stay at the hospital during part of their recovery, the visitor guidelines for inpatient rooms apply. Pre-op nurse will coordinate an appropriate time for 1 support person to accompany patient in pre-op.  This support person may not rotate.   Please refer to [email or web address] for the visitor guidelines for Inpatients (after your surgery is over and you are in a regular room).    Special instructions:  Oral Hygiene is also important to reduce your risk of infection.  Remember - BRUSH YOUR TEETH THE MORNING OF SURGERY WITH YOUR REGULAR TOOTHPASTE   [facility]- Preparing For Surgery  Before surgery, you can play an important role. Because skin is not sterile, your skin needs to be as free of germs as possible. You can reduce the number of germs on  your skin by washing with CHG (chlorahexidine gluconate) Soap before surgery.  CHG is an antiseptic cleaner which kills germs and bonds with the skin to continue killing germs even after washing.     Please do not use if you have an allergy to CHG or antibacterial soaps. If your skin becomes reddened/irritated stop using the CHG.  Do not shave (including legs and underarms) for at least 48 hours prior to first CHG shower. It is OK to shave your face.  Please follow these instructions carefully.     Shower the NIGHT BEFORE SURGERY and the MORNING OF SURGERY with CHG Soap.   If you chose to wash your hair, wash your hair first as usual with your normal shampoo. After you shampoo, rinse your hair and body thoroughly to remove the shampoo.  Then [employer] and genitals (private parts) with your normal soap and rinse thoroughly to remove soap.  After that Use CHG Soap as you would any other liquid soap. You can apply CHG directly to the skin and wash gently with a scrungie or a clean washcloth.   Apply the CHG Soap to your body ONLY FROM THE NECK DOWN.  Do not use on open wounds or open sores. Avoid contact with your eyes, ears, mouth and genitals (private parts). Wash Face and genitals (private parts)  with your normal soap.   Wash thoroughly, paying special attention to the area where your surgery will be performed.  Thoroughly rinse your body with warm water from the neck down.  DO NOT shower/wash with your normal soap after using and rinsing off the CHG Soap.  Pat yourself dry with a CLEAN TOWEL.  Wear CLEAN PAJAMAS to bed the night before surgery  Place CLEAN SHEETS on your bed the night before your surgery  DO NOT SLEEP WITH PETS.   Day of Surgery: Take a shower with CHG soap. Wear Clean/Comfortable clothing the morning of surgery Do not apply any deodorants/lotions.   Remember to brush your teeth WITH YOUR REGULAR TOOTHPASTE.    If you received a COVID test during your pre-op  visit, it is requested that you wear a mask when out in public, stay away from anyone that may not be feeling well, and notify your surgeon if you develop symptoms. If you have been in contact with anyone that has tested positive in the last 10 days, please notify your surgeon.    Please read over the following fact sheets that you were given.

## 2022-10-18 ENCOUNTER — Encounter (HOSPITAL_COMMUNITY)
Admission: RE | Admit: 2022-10-18 | Discharge: 2022-10-18 | Disposition: A | Discharge status: Home / Self Care | Payer: 59 | Source: Ambulatory Visit | Attending: Neurological Surgery | Admitting: Neurological Surgery

## 2022-10-18 ENCOUNTER — Encounter (HOSPITAL_COMMUNITY): Payer: Self-pay

## 2022-10-18 ENCOUNTER — Other Ambulatory Visit: Payer: Self-pay

## 2022-10-18 VITALS — BP 118/70 | HR 85 | Temp 97.4°F | Resp 18 | Ht 65.0 in | Wt 121.3 lb

## 2022-10-18 DIAGNOSIS — I509 Heart failure, unspecified: Secondary | ICD-10-CM | POA: Diagnosis not present

## 2022-10-18 DIAGNOSIS — Z01812 Encounter for preprocedural laboratory examination: Secondary | ICD-10-CM | POA: Diagnosis not present

## 2022-10-18 DIAGNOSIS — Z01818 Encounter for other preprocedural examination: Secondary | ICD-10-CM

## 2022-10-18 HISTORY — DX: Chronic kidney disease, unspecified: N18.9

## 2022-10-18 HISTORY — DX: Pneumonia, unspecified organism: J18.9

## 2022-10-18 HISTORY — DX: Sleep apnea, unspecified: G47.30

## 2022-10-18 LAB — CBC
HCT: 46.3 % — ABNORMAL HIGH (ref 36.0–46.0)
Hemoglobin: 15.5 g/dL — ABNORMAL HIGH (ref 12.0–15.0)
MCH: 32.3 pg (ref 26.0–34.0)
MCHC: 33.5 g/dL (ref 30.0–36.0)
MCV: 96.5 fL (ref 80.0–100.0)
Platelets: 430 10*3/uL — ABNORMAL HIGH (ref 150–400)
RBC: 4.8 MIL/uL (ref 3.87–5.11)
RDW: 14.4 % (ref 11.5–15.5)
WBC: 7.2 10*3/uL (ref 4.0–10.5)
nRBC: 0 % (ref 0.0–0.2)

## 2022-10-18 LAB — BASIC METABOLIC PANEL
Anion gap: 12 (ref 5–15)
BUN: 14 mg/dL (ref 8–23)
CO2: 29 mmol/L (ref 22–32)
Calcium: 9.3 mg/dL (ref 8.9–10.3)
Chloride: 99 mmol/L (ref 98–111)
Creatinine, Ser: 1.03 mg/dL — ABNORMAL HIGH (ref 0.44–1.00)
GFR, Estimated: 56 mL/min — ABNORMAL LOW (ref >60)
Glucose, Bld: 125 mg/dL — ABNORMAL HIGH (ref 70–99)
Potassium: 3.1 mmol/L — ABNORMAL LOW (ref 3.5–5.1)
Sodium: 140 mmol/L (ref 135–145)

## 2022-10-18 LAB — SURGICAL PCR SCREEN
MRSA, PCR: NEGATIVE
Staphylococcus aureus: POSITIVE — AB

## 2022-10-18 LAB — TYPE AND SCREEN
ABO/RH(D): A POS
Antibody Screen: NEGATIVE

## 2022-10-18 NOTE — Progress Notes (Signed)
PCP - Betty Martinique Cardiologist -  Gwyndolyn Kaufman  PPM/ICD -  Denies   Chest x-ray - N/A.  Pt denies sOB EKG - 11/14/21 Stress Test - 04/01/18 ECHO - 08/02/21 Cardiac Cath - 05/25/20  Sleep Study - Yes.  Positive for OSA CPAP - Yes.  Setting at 10  DM-  Denies   Blood Thinner Instructions:  Plavic, Platel.. Last dose 10/13/2022 Per Pt as recommended by MD Aspirin Instructions:  Per pt MD indicated to stop 7 days prior.  Last Dose Saturday 10/13/2022  ERAS Protcol - Yes PRE-SURGERY Ensure or G2- No drink ordered.    COVID TEST- N/A   Anesthesia review: Yes, cardiac hx. Clearance note dated 10/04/2022.  Patient denies shortness of breath, fever, cough and chest pain at PAT appointment   All instructions explained to the patient, with a verbal understanding of the material. Patient agrees to go over the instructions while at home for a better understanding. Patient also instructed to self quarantine after being tested for COVID-19. The opportunity to ask questions was provided.

## 2022-10-19 NOTE — Anesthesia Preprocedure Evaluation (Addendum)
Anesthesia Evaluation  Patient identified by MRN, date of birth, ID band Patient awake    Reviewed: Allergy & Precautions, H&P , NPO status , Patient's Chart, lab work & pertinent test results  Airway Mallampati: II   Neck ROM: full    Dental   Pulmonary sleep apnea, Continuous Positive Airway Pressure Ventilation and Oxygen sleep apnea , COPD, Current Smoker H/o pulmonary nodules. H/o VATs resection.   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 Takes plavix and pletal for this.   Neuro/Psych  PSYCHIATRIC DISORDERS Anxiety Depression     Neuromuscular disease    GI/Hepatic hiatal hernia,GERD  ,,  Endo/Other  Hypothyroidism    Renal/GU      Musculoskeletal  (+) Arthritis ,  Fibromyalgia -  Abdominal   Peds  Hematology   Anesthesia Other Findings   Reproductive/Obstetrics                             Anesthesia Physical Anesthesia Plan  ASA: 3  Anesthesia Plan: General   Post-op Pain Management:    Induction: Intravenous  PONV Risk Score and Plan: 2 and Ondansetron, Dexamethasone, Midazolam and Treatment may vary due to age or medical condition  Airway Management Planned: Oral ETT  Additional Equipment:   Intra-op Plan:   Post-operative Plan: Extubation in OR  Informed Consent: I have reviewed the patients History and Physical, chart, labs and discussed the procedure including the risks, benefits and alternatives for the proposed anesthesia with the patient or authorized representative who has indicated his/her understanding and acceptance.     Dental advisory given  Plan Discussed with: CRNA, Anesthesiologist and Surgeon  Anesthesia Plan Comments: (PAT note by Karoline Caldwell, PA-C: Follows with cardiology for hx of nonobstructive  CAD, hypertension, diastolic dysfunction, peripheral arterial disease and chronic mesenteric ischemia. Was hospitalized in 8/2021with worsening DOE.Echo showed reduced EFat 45-50%. Seen by cardiology and underwent coronary angiography which was negative for any obstructive disease. She was started on Paxil given significant depression.Shewas found to have left subclavian artery occlusion in 06/2020 and hadsubsequent angiogram and stent placementper Dr. Donzetta Matters.Last echo 07/2021 with improved LVEF to 55-60% and grade 1 DD.Last seen by Robbie Lis 08/09/22, at that time, no changes made to management, 19-month follow-up recommended.  Cleared for surgery telephone encounter 10/04/2022 by Coletta Memos, NP stating, "Chart reviewed as part of pre-operative protocol coverage. Given past medical history and time since last visit, based on ACC/AHA guidelines,Safira M Taylorwould be at acceptable risk for the planned procedure without further cardiovascular testing.Her RCRI is a class II risk, 0.9% risk of major cardiac event."  Patient is on Plavix and Pletal due to subclavian stent 06/2020.  She also has history of chronic nonocclusive DVT of RLE.  Patient reports last dose of both medications 10/13/2022.  Follows with pulmonology for history of severe COPDand OSA on CPAP with 2L oxygen at night.  She is maintained on Trelegy Ellipta and as needed DuoNebs.  History of right breast cancer s/p simple right mastectomy 2010.  History of hiatal hernia.  Preop labs reviewed, potassium mildly low at 3.1, otherwise unremarkable.  EKG 11/14/2021: NSR.  Rate 62.  TTE 08/02/2021: 1. Left ventricular ejection fraction, by estimation, is 55 to 60%. The  left ventricle has normal function. The left ventricle has no regional  wall motion abnormalities. Left ventricular diastolic parameters are  consistent with Grade II diastolic  dysfunction (pseudonormalization).  2. Right ventricular systolic function is  normal. The right ventricular  size is normal. Tricuspid regurgitation signal is inadequate for assessing  PA pressure.  3. The mitral valve is normal in structure. No evidence of mitral valve  regurgitation. No evidence of mitral stenosis.  4. The aortic valve is normal in structure. Aortic valve regurgitation is  not visualized. No aortic stenosis is present.  5. The inferior vena cava is dilated in size with >50% respiratory  variability, suggesting right atrial pressure of 8 mmHg.   Comparison(s): Compared to prior echo in 05/21/2020, there has been  improvement in the Left ventricular ejection fraction was 45-50% now  55-60%.   Cath 05/25/2020: No significant coronary obstructive disease. The left main is short and immediately trifurcates into a large LAD, ramus immediate and left circumflex vessel. The midportion of the LAD dips intramyocardially slightly but there is no evidence for systolic bridging. The RCA is a dominant vessel that has calcification without obstructive stenosis.  Hyperdynamic LV function with EF estimated at 65%. LVEDP 10 mmHg.  RECOMMENDATION: Medical therapy.   )        Anesthesia Quick Evaluation

## 2022-10-19 NOTE — Progress Notes (Signed)
Anesthesia Chart Review:  Follows with cardiology for hx of nonobstructive CAD, hypertension, diastolic dysfunction, peripheral arterial disease and chronic mesenteric ischemia. Was hospitalized in 05/2020 with worsening DOE.  Echo showed reduced EF at 45-50%. Seen by cardiology and underwent coronary angiography which was negative for any obstructive disease. She was started on Paxil given significant depression. 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 Donzetta Matters. Last echo 07/2021 with improved LVEF to 55-60% and grade 1 DD. Last seen by Robbie Lis 08/09/22, at that time, no changes made to management, 68-month follow-up recommended.  Cleared for surgery telephone encounter 10/04/2022 by Coletta Memos, NP stating, "Chart reviewed as part of pre-operative protocol coverage. Given past medical history and time since last visit, based on ACC/AHA guidelines, [name] would be at acceptable risk for the planned procedure without further cardiovascular testing. Her RCRI is a class II risk, 0.9% risk of major cardiac event."   Patient is on Plavix and Pletal due to subclavian stent 06/2020.  She also has history of chronic nonocclusive DVT of RLE.  Patient reports last dose of both medications 10/13/2022.  Follows with pulmonology for history of severe COPD and OSA on CPAP with 2L oxygen at night.  She is maintained on Trelegy Ellipta and as needed DuoNebs.  History of right breast cancer s/p simple right mastectomy 2010.  History of hiatal hernia.  Preop labs reviewed, potassium mildly low at 3.1, otherwise unremarkable.  EKG 11/14/2021: NSR.  Rate 62.  TTE 08/02/2021:  1. Left ventricular ejection fraction, by estimation, is 55 to 60%. The  left ventricle has normal function. The left ventricle has no regional  wall motion abnormalities. Left ventricular diastolic parameters are  consistent with Grade II diastolic   dysfunction (pseudonormalization).   2. Right ventricular systolic function is normal. The right ventricular  size is normal. Tricuspid regurgitation signal is inadequate for assessing  PA pressure.   3. The mitral valve is normal in structure. No evidence of mitral valve  regurgitation. No evidence of mitral stenosis.   4. The aortic valve is normal in structure. Aortic valve regurgitation is  not visualized. No aortic stenosis is present.   5. The inferior vena cava is dilated in size with >50% respiratory  variability, suggesting right atrial pressure of 8 mmHg.   Comparison(s): Compared to prior echo in 05/21/2020, there has been  improvement in the Left ventricular ejection fraction was 45-50% now  55-60%.   Cath 05/25/2020: No significant coronary obstructive disease.  The left main is short and immediately trifurcates into a large LAD, ramus immediate and left circumflex vessel.  The midportion of the LAD dips intramyocardially slightly but there is no evidence for systolic bridging.  The RCA is a dominant vessel that has calcification without obstructive stenosis.   Hyperdynamic LV function with EF estimated at 65%.  LVEDP 10 mmHg.   RECOMMENDATION: Medical therapy.    Wynonia Musty [facility] Stay [facility]/Anesthesiology Phone [phone] 10/19/2022 12:00 PM

## 2022-10-21 DIAGNOSIS — R0602 Shortness of breath: Secondary | ICD-10-CM | POA: Diagnosis not present

## 2022-10-21 DIAGNOSIS — J449 Chronic obstructive pulmonary disease, unspecified: Secondary | ICD-10-CM | POA: Diagnosis not present

## 2022-10-21 DIAGNOSIS — J9601 Acute respiratory failure with hypoxia: Secondary | ICD-10-CM | POA: Diagnosis not present

## 2022-10-22 ENCOUNTER — Ambulatory Visit (HOSPITAL_COMMUNITY): Payer: 59

## 2022-10-22 ENCOUNTER — Ambulatory Visit (HOSPITAL_COMMUNITY): Payer: 59 | Admitting: Physician Assistant

## 2022-10-22 ENCOUNTER — Encounter (HOSPITAL_COMMUNITY): Payer: Self-pay | Admitting: Neurological Surgery

## 2022-10-22 ENCOUNTER — Observation Stay (HOSPITAL_COMMUNITY)
Admission: RE | Admit: 2022-10-22 | Discharge: 2022-10-24 | Disposition: A | Discharge status: Home Health | Payer: 59 | Attending: Neurological Surgery | Admitting: Neurological Surgery

## 2022-10-22 ENCOUNTER — Other Ambulatory Visit: Payer: Self-pay

## 2022-10-22 ENCOUNTER — Encounter (HOSPITAL_COMMUNITY)
Admission: RE | Disposition: A | Discharge status: Home Health | Payer: Self-pay | Source: Home / Self Care | Attending: Neurological Surgery

## 2022-10-22 ENCOUNTER — Ambulatory Visit (HOSPITAL_BASED_OUTPATIENT_CLINIC_OR_DEPARTMENT_OTHER): Payer: 59 | Admitting: Physician Assistant

## 2022-10-22 DIAGNOSIS — F17210 Nicotine dependence, cigarettes, uncomplicated: Secondary | ICD-10-CM

## 2022-10-22 DIAGNOSIS — M4316 Spondylolisthesis, lumbar region: Principal | ICD-10-CM | POA: Diagnosis present

## 2022-10-22 DIAGNOSIS — G4733 Obstructive sleep apnea (adult) (pediatric): Secondary | ICD-10-CM | POA: Diagnosis not present

## 2022-10-22 DIAGNOSIS — I10 Essential (primary) hypertension: Secondary | ICD-10-CM | POA: Diagnosis not present

## 2022-10-22 DIAGNOSIS — I700 Atherosclerosis of aorta: Secondary | ICD-10-CM | POA: Diagnosis not present

## 2022-10-22 DIAGNOSIS — J449 Chronic obstructive pulmonary disease, unspecified: Secondary | ICD-10-CM | POA: Insufficient documentation

## 2022-10-22 DIAGNOSIS — I509 Heart failure, unspecified: Secondary | ICD-10-CM | POA: Insufficient documentation

## 2022-10-22 DIAGNOSIS — Z86718 Personal history of other venous thrombosis and embolism: Secondary | ICD-10-CM | POA: Insufficient documentation

## 2022-10-22 DIAGNOSIS — Z9989 Dependence on other enabling machines and devices: Secondary | ICD-10-CM | POA: Diagnosis not present

## 2022-10-22 DIAGNOSIS — I2510 Atherosclerotic heart disease of native coronary artery without angina pectoris: Secondary | ICD-10-CM | POA: Diagnosis not present

## 2022-10-22 DIAGNOSIS — E039 Hypothyroidism, unspecified: Secondary | ICD-10-CM | POA: Diagnosis not present

## 2022-10-22 DIAGNOSIS — Z981 Arthrodesis status: Secondary | ICD-10-CM | POA: Diagnosis not present

## 2022-10-22 DIAGNOSIS — N183 Chronic kidney disease, stage 3 unspecified: Secondary | ICD-10-CM | POA: Insufficient documentation

## 2022-10-22 DIAGNOSIS — Z853 Personal history of malignant neoplasm of breast: Secondary | ICD-10-CM | POA: Insufficient documentation

## 2022-10-22 DIAGNOSIS — M5416 Radiculopathy, lumbar region: Secondary | ICD-10-CM | POA: Diagnosis not present

## 2022-10-22 DIAGNOSIS — I130 Hypertensive heart and chronic kidney disease with heart failure and stage 1 through stage 4 chronic kidney disease, or unspecified chronic kidney disease: Secondary | ICD-10-CM | POA: Insufficient documentation

## 2022-10-22 HISTORY — PX: TRANSFORAMINAL LUMBAR INTERBODY FUSION W/ MIS 1 LEVEL: SHX6145

## 2022-10-22 SURGERY — MINIMALLY INVASIVE (MIS) TRANSFORAMINAL LUMBAR INTERBODY FUSION (TLIF) 1 LEVEL
Anesthesia: General

## 2022-10-22 MED ORDER — ONDANSETRON HCL 4 MG/2ML IJ SOLN: 4.0000 mg | Freq: Four times a day (QID) | INTRAMUSCULAR | Status: DC | PRN

## 2022-10-22 MED ORDER — SODIUM CHLORIDE 0.9% FLUSH: 3.0000 mL | INTRAVENOUS | Status: DC | PRN

## 2022-10-22 MED ORDER — DEXAMETHASONE SODIUM PHOSPHATE 10 MG/ML IJ SOLN
INTRAMUSCULAR | Status: AC
  Filled 2022-10-22: qty 1

## 2022-10-22 MED ORDER — SUGAMMADEX SODIUM 200 MG/2ML IV SOLN
INTRAVENOUS | Status: DC | PRN
  Administered 2022-10-22: 200 mg via INTRAVENOUS

## 2022-10-22 MED ORDER — ATORVASTATIN CALCIUM 80 MG PO TABS
80.0000 mg | ORAL_TABLET | Freq: Every day | ORAL | Status: DC
  Administered 2022-10-23 – 2022-10-24 (×2): 80 mg via ORAL
  Filled 2022-10-22 (×2): qty 1

## 2022-10-22 MED ORDER — THROMBIN 5000 UNITS EX SOLR
CUTANEOUS | Status: AC
  Filled 2022-10-22: qty 5000

## 2022-10-22 MED ORDER — HYDROMORPHONE HCL 1 MG/ML IJ SOLN: 1.0000 mg | INTRAMUSCULAR | Status: DC | PRN

## 2022-10-22 MED ORDER — PROPOFOL 10 MG/ML IV BOLUS
INTRAVENOUS | Status: AC
  Filled 2022-10-22: qty 20

## 2022-10-22 MED ORDER — ONDANSETRON HCL 4 MG/2ML IJ SOLN
INTRAMUSCULAR | Status: DC | PRN
  Administered 2022-10-22: 4 mg via INTRAVENOUS

## 2022-10-22 MED ORDER — LORAZEPAM 0.5 MG PO TABS
2.0000 mg | ORAL_TABLET | Freq: Every day | ORAL | Status: DC
  Administered 2022-10-22 – 2022-10-23 (×2): 2 mg via ORAL
  Filled 2022-10-22 (×2): qty 4

## 2022-10-22 MED ORDER — ROCURONIUM BROMIDE 10 MG/ML (PF) SYRINGE
PREFILLED_SYRINGE | INTRAVENOUS | Status: AC
  Filled 2022-10-22: qty 10

## 2022-10-22 MED ORDER — PHENOL 1.4 % MT LIQD: 1.0000 | OROMUCOSAL | Status: DC | PRN

## 2022-10-22 MED ORDER — CHLORHEXIDINE GLUCONATE 0.12 % MT SOLN: 15.0000 mL | Freq: Once | OROMUCOSAL | Status: AC

## 2022-10-22 MED ORDER — OXYCODONE HCL 5 MG PO TABS: 5.0000 mg | ORAL_TABLET | Freq: Once | ORAL | Status: DC | PRN

## 2022-10-22 MED ORDER — TAMSULOSIN HCL 0.4 MG PO CAPS
0.4000 mg | ORAL_CAPSULE | Freq: Every day | ORAL | Status: DC
  Administered 2022-10-22 – 2022-10-24 (×3): 0.4 mg via ORAL
  Filled 2022-10-22 (×3): qty 1

## 2022-10-22 MED ORDER — MENTHOL 3 MG MT LOZG: 1.0000 | LOZENGE | OROMUCOSAL | Status: DC | PRN

## 2022-10-22 MED ORDER — FENTANYL CITRATE (PF) 100 MCG/2ML IJ SOLN
25.0000 ug | INTRAMUSCULAR | Status: DC | PRN
  Administered 2022-10-22 (×3): 50 ug via INTRAVENOUS

## 2022-10-22 MED ORDER — 0.9 % SODIUM CHLORIDE (POUR BTL) OPTIME
TOPICAL | Status: DC | PRN
  Administered 2022-10-22: 1000 mL

## 2022-10-22 MED ORDER — MIDAZOLAM HCL 2 MG/2ML IJ SOLN
INTRAMUSCULAR | Status: DC | PRN
  Administered 2022-10-22 (×2): 1 mg via INTRAVENOUS

## 2022-10-22 MED ORDER — UMECLIDINIUM BROMIDE 62.5 MCG/ACT IN AEPB
1.0000 | INHALATION_SPRAY | Freq: Every day | RESPIRATORY_TRACT | Status: DC
  Administered 2022-10-23: 1 via RESPIRATORY_TRACT
  Filled 2022-10-22: qty 7

## 2022-10-22 MED ORDER — FENTANYL CITRATE (PF) 100 MCG/2ML IJ SOLN
INTRAMUSCULAR | Status: AC
  Filled 2022-10-22: qty 2

## 2022-10-22 MED ORDER — ALBUTEROL SULFATE HFA 108 (90 BASE) MCG/ACT IN AERS: 1.0000 | INHALATION_SPRAY | Freq: Four times a day (QID) | RESPIRATORY_TRACT | Status: DC | PRN

## 2022-10-22 MED ORDER — EZETIMIBE 10 MG PO TABS
10.0000 mg | ORAL_TABLET | Freq: Every day | ORAL | Status: DC
  Administered 2022-10-23 – 2022-10-24 (×2): 10 mg via ORAL
  Filled 2022-10-22 (×2): qty 1

## 2022-10-22 MED ORDER — OXYCODONE HCL 5 MG PO TABS: 5.0000 mg | ORAL_TABLET | ORAL | Status: DC | PRN

## 2022-10-22 MED ORDER — LEVOTHYROXINE SODIUM 112 MCG PO TABS
112.0000 ug | ORAL_TABLET | Freq: Every day | ORAL | Status: DC
  Administered 2022-10-23 – 2022-10-24 (×2): 112 ug via ORAL
  Filled 2022-10-22 (×2): qty 1

## 2022-10-22 MED ORDER — FLUTICASONE FUROATE-VILANTEROL 200-25 MCG/ACT IN AEPB
1.0000 | INHALATION_SPRAY | Freq: Every day | RESPIRATORY_TRACT | Status: DC
  Administered 2022-10-23: 1 via RESPIRATORY_TRACT
  Filled 2022-10-22: qty 28

## 2022-10-22 MED ORDER — CEFAZOLIN SODIUM-DEXTROSE 2-4 GM/100ML-% IV SOLN
2.0000 g | INTRAVENOUS | Status: AC
  Administered 2022-10-22: 2 g via INTRAVENOUS
  Filled 2022-10-22: qty 100

## 2022-10-22 MED ORDER — OXYCODONE HCL 5 MG/5ML PO SOLN: 5.0000 mg | Freq: Once | ORAL | Status: DC | PRN

## 2022-10-22 MED ORDER — MONTELUKAST SODIUM 10 MG PO TABS
10.0000 mg | ORAL_TABLET | Freq: Every day | ORAL | Status: DC
  Administered 2022-10-22 – 2022-10-23 (×2): 10 mg via ORAL
  Filled 2022-10-22 (×2): qty 1

## 2022-10-22 MED ORDER — LACTATED RINGERS IV SOLN: INTRAVENOUS | Status: DC

## 2022-10-22 MED ORDER — CHLORHEXIDINE GLUCONATE CLOTH 2 % EX PADS: 6.0000 | MEDICATED_PAD | Freq: Once | CUTANEOUS | Status: DC

## 2022-10-22 MED ORDER — TRAZODONE HCL 50 MG PO TABS
100.0000 mg | ORAL_TABLET | Freq: Every day | ORAL | Status: DC
  Administered 2022-10-22 – 2022-10-23 (×2): 100 mg via ORAL
  Filled 2022-10-22 (×2): qty 2

## 2022-10-22 MED ORDER — GABAPENTIN 300 MG PO CAPS: 300.0000 mg | ORAL_CAPSULE | Freq: Four times a day (QID) | ORAL | Status: DC | PRN

## 2022-10-22 MED ORDER — POTASSIUM CHLORIDE CRYS ER 20 MEQ PO TBCR
20.0000 meq | EXTENDED_RELEASE_TABLET | Freq: Every day | ORAL | Status: DC
  Administered 2022-10-22 – 2022-10-24 (×3): 20 meq via ORAL
  Filled 2022-10-22 (×3): qty 1

## 2022-10-22 MED ORDER — FLUTICASONE PROPIONATE 50 MCG/ACT NA SUSP
1.0000 | Freq: Every day | NASAL | Status: DC
  Administered 2022-10-23 – 2022-10-24 (×2): 1 via NASAL
  Filled 2022-10-22: qty 16

## 2022-10-22 MED ORDER — ROCURONIUM BROMIDE 10 MG/ML (PF) SYRINGE
PREFILLED_SYRINGE | INTRAVENOUS | Status: DC | PRN
  Administered 2022-10-22: 60 mg via INTRAVENOUS
  Administered 2022-10-22: 5 mg via INTRAVENOUS
  Administered 2022-10-22: 20 mg via INTRAVENOUS

## 2022-10-22 MED ORDER — CYCLOBENZAPRINE HCL 10 MG PO TABS
10.0000 mg | ORAL_TABLET | Freq: Three times a day (TID) | ORAL | Status: DC | PRN
  Administered 2022-10-22 – 2022-10-24 (×3): 10 mg via ORAL
  Filled 2022-10-22 (×3): qty 1

## 2022-10-22 MED ORDER — FENTANYL CITRATE (PF) 250 MCG/5ML IJ SOLN
INTRAMUSCULAR | Status: AC
  Filled 2022-10-22: qty 5

## 2022-10-22 MED ORDER — ACETAMINOPHEN 650 MG RE SUPP: 650.0000 mg | RECTAL | Status: DC | PRN

## 2022-10-22 MED ORDER — PANTOPRAZOLE SODIUM 40 MG PO TBEC
40.0000 mg | DELAYED_RELEASE_TABLET | Freq: Every day | ORAL | Status: DC
  Administered 2022-10-23 – 2022-10-24 (×2): 40 mg via ORAL
  Filled 2022-10-22 (×2): qty 1

## 2022-10-22 MED ORDER — OXYCODONE HCL 5 MG PO TABS
10.0000 mg | ORAL_TABLET | ORAL | Status: DC | PRN
  Administered 2022-10-22 – 2022-10-24 (×7): 10 mg via ORAL
  Filled 2022-10-22 (×8): qty 2

## 2022-10-22 MED ORDER — LIDOCAINE 2% (20 MG/ML) 5 ML SYRINGE
INTRAMUSCULAR | Status: AC
  Filled 2022-10-22: qty 5

## 2022-10-22 MED ORDER — ONDANSETRON HCL 4 MG PO TABS: 4.0000 mg | ORAL_TABLET | Freq: Four times a day (QID) | ORAL | Status: DC | PRN

## 2022-10-22 MED ORDER — ONDANSETRON HCL 4 MG/2ML IJ SOLN
INTRAMUSCULAR | Status: AC
  Filled 2022-10-22: qty 2

## 2022-10-22 MED ORDER — POLYETHYLENE GLYCOL 3350 17 G PO PACK: 17.0000 g | PACK | Freq: Every day | ORAL | Status: DC | PRN

## 2022-10-22 MED ORDER — CEFAZOLIN SODIUM-DEXTROSE 2-4 GM/100ML-% IV SOLN
2.0000 g | Freq: Three times a day (TID) | INTRAVENOUS | Status: AC
  Administered 2022-10-23 (×2): 2 g via INTRAVENOUS
  Filled 2022-10-22 (×2): qty 100

## 2022-10-22 MED ORDER — LIDOCAINE-EPINEPHRINE 1 %-1:100000 IJ SOLN
INTRAMUSCULAR | Status: DC | PRN
  Administered 2022-10-22: 8 mL

## 2022-10-22 MED ORDER — ALBUTEROL SULFATE (2.5 MG/3ML) 0.083% IN NEBU: 2.5000 mg | INHALATION_SOLUTION | Freq: Four times a day (QID) | RESPIRATORY_TRACT | Status: DC | PRN

## 2022-10-22 MED ORDER — DEXAMETHASONE SODIUM PHOSPHATE 10 MG/ML IJ SOLN
INTRAMUSCULAR | Status: DC | PRN
  Administered 2022-10-22: 5 mg via INTRAVENOUS

## 2022-10-22 MED ORDER — THROMBIN 5000 UNITS EX SOLR
OROMUCOSAL | Status: DC | PRN
  Administered 2022-10-22: 5 mL via TOPICAL

## 2022-10-22 MED ORDER — DOCUSATE SODIUM 100 MG PO CAPS
100.0000 mg | ORAL_CAPSULE | Freq: Two times a day (BID) | ORAL | Status: DC
  Administered 2022-10-22 – 2022-10-24 (×4): 100 mg via ORAL
  Filled 2022-10-22 (×4): qty 1

## 2022-10-22 MED ORDER — ALBUMIN HUMAN 5 % IV SOLN: INTRAVENOUS | Status: DC | PRN

## 2022-10-22 MED ORDER — FENTANYL CITRATE (PF) 250 MCG/5ML IJ SOLN
INTRAMUSCULAR | Status: DC | PRN
  Administered 2022-10-22 (×2): 50 ug via INTRAVENOUS
  Administered 2022-10-22: 100 ug via INTRAVENOUS
  Administered 2022-10-22: 50 ug via INTRAVENOUS

## 2022-10-22 MED ORDER — SODIUM CHLORIDE 0.9% FLUSH
3.0000 mL | Freq: Two times a day (BID) | INTRAVENOUS | Status: DC
  Administered 2022-10-22 – 2022-10-23 (×2): 3 mL via INTRAVENOUS

## 2022-10-22 MED ORDER — PROPOFOL 10 MG/ML IV BOLUS
INTRAVENOUS | Status: DC | PRN
  Administered 2022-10-22: 100 mg via INTRAVENOUS

## 2022-10-22 MED ORDER — ORAL CARE MOUTH RINSE
15.0000 mL | Freq: Once | OROMUCOSAL | Status: AC
  Administered 2022-10-22: 15 mL via OROMUCOSAL

## 2022-10-22 MED ORDER — ACETAMINOPHEN 325 MG PO TABS
650.0000 mg | ORAL_TABLET | ORAL | Status: DC | PRN
  Administered 2022-10-23 – 2022-10-24 (×3): 650 mg via ORAL
  Filled 2022-10-22 (×3): qty 2

## 2022-10-22 MED ORDER — PHENYLEPHRINE HCL-NACL 20-0.9 MG/250ML-% IV SOLN
INTRAVENOUS | Status: DC | PRN
  Administered 2022-10-22: 40 ug/min via INTRAVENOUS

## 2022-10-22 MED ORDER — SODIUM CHLORIDE 0.9 % IV SOLN: 250.0000 mL | INTRAVENOUS | Status: DC

## 2022-10-22 MED ORDER — LIDOCAINE 2% (20 MG/ML) 5 ML SYRINGE
INTRAMUSCULAR | Status: DC | PRN
  Administered 2022-10-22: 80 mg via INTRAVENOUS

## 2022-10-22 MED ORDER — FUROSEMIDE 40 MG PO TABS: 40.0000 mg | ORAL_TABLET | Freq: Two times a day (BID) | ORAL | Status: DC

## 2022-10-22 SURGICAL SUPPLY — 70 items
ADH SKN CLS APL DERMABOND .7 (GAUZE/BANDAGES/DRESSINGS) ×2
BAG COUNTER SPONGE SURGICOUNT (BAG) ×3 IMPLANT
BAG SPNG CNTER NS LX DISP (BAG) ×2
BAND INSRT 18 STRL LF DISP RB (MISCELLANEOUS) ×4
BAND RUBBER #18 3X1/16 STRL (MISCELLANEOUS) ×6 IMPLANT
BASKET BONE COLLECTION (BASKET) ×3 IMPLANT
BLADE CLIPPER SURG (BLADE) IMPLANT
BLADE SURG 11 STRL SS (BLADE) ×3 IMPLANT
BUR 14 MATCH 3 (BUR) IMPLANT
BUR MATCHSTICK NEURO 3.0 LAGG (BURR) IMPLANT
BUR MR8 14CM BALL SYMTRI 5 (BUR) IMPLANT
BUR SURG IBUR 4X12.5 (BURR) ×3 IMPLANT
BURR 14 MATCH 3 (BUR) ×2
BURR MR8 14CM BALL SYMTRI 5 (BUR) ×2
BURR SURG IBUR 4X12.5 (BURR) ×2
CAGE EXP CATALYFT 9 (Plate) IMPLANT
CNTNR URN SCR LID CUP LEK RST (MISCELLANEOUS) ×3 IMPLANT
CONT SPEC 4OZ STRL OR WHT (MISCELLANEOUS) ×2
COVER BACK TABLE 60X90IN (DRAPES) ×3 IMPLANT
COVERAGE SUPPORT O-ARM STEALTH (MISCELLANEOUS) ×2 IMPLANT
DERMABOND ADVANCED .7 DNX12 (GAUZE/BANDAGES/DRESSINGS) ×3 IMPLANT
DRAPE C-ARM 42X72 X-RAY (DRAPES) ×3 IMPLANT
DRAPE C-ARMOR (DRAPES) ×3 IMPLANT
DRAPE LAPAROTOMY 100X72X124 (DRAPES) ×3 IMPLANT
DRAPE MICROSCOPE SLANT 54X150 (MISCELLANEOUS) ×3 IMPLANT
DRAPE SHEET LG 3/4 BI-LAMINATE (DRAPES) ×12 IMPLANT
DRAPE SURG 17X23 STRL (DRAPES) ×6 IMPLANT
ELECT BLADE 6.5 EXT (BLADE) ×3 IMPLANT
ELECT REM PT RETURN 9FT ADLT (ELECTROSURGICAL) ×2
ELECTRODE REM PT RTRN 9FT ADLT (ELECTROSURGICAL) ×3 IMPLANT
EXTENDER TAB GUIDE SV 5.5/6.0 (INSTRUMENTS) IMPLANT
FEE COVERAGE SUPPORT O-ARM (MISCELLANEOUS) ×3 IMPLANT
GAUZE 4X4 16PLY ~~LOC~~+RFID DBL (SPONGE) IMPLANT
GAUZE SPONGE 4X4 12PLY STRL (GAUZE/BANDAGES/DRESSINGS) ×3 IMPLANT
GLOVE BIOGEL PI IND STRL 7.5 (GLOVE) ×3 IMPLANT
GLOVE ECLIPSE 7.5 STRL STRAW (GLOVE) ×3 IMPLANT
GOWN STRL REUS W/ TWL LRG LVL3 (GOWN DISPOSABLE) ×3 IMPLANT
GOWN STRL REUS W/ TWL XL LVL3 (GOWN DISPOSABLE) IMPLANT
GOWN STRL REUS W/TWL 2XL LVL3 (GOWN DISPOSABLE) IMPLANT
GOWN STRL REUS W/TWL LRG LVL3 (GOWN DISPOSABLE) ×2
GOWN STRL REUS W/TWL XL LVL3 (GOWN DISPOSABLE)
HEMOSTAT POWDER KIT SURGIFOAM (HEMOSTASIS) ×3 IMPLANT
KIT BASIN OR (CUSTOM PROCEDURE TRAY) ×3 IMPLANT
KIT POSITION SURG JACKSON T1 (MISCELLANEOUS) ×3 IMPLANT
KIT TURNOVER KIT B (KITS) IMPLANT
MARKER SPHERE PSV REFLC NDI (MISCELLANEOUS) ×15 IMPLANT
NDL HYPO 18GX1.5 BLUNT FILL (NEEDLE) IMPLANT
NDL SPNL 18GX3.5 QUINCKE PK (NEEDLE) IMPLANT
NEEDLE HYPO 18GX1.5 BLUNT FILL (NEEDLE) IMPLANT
NEEDLE HYPO 22GX1.5 SAFETY (NEEDLE) ×3 IMPLANT
NEEDLE SPNL 18GX3.5 QUINCKE PK (NEEDLE) IMPLANT
NS IRRIG 1000ML POUR BTL (IV SOLUTION) ×3 IMPLANT
PACK LAMINECTOMY NEURO (CUSTOM PROCEDURE TRAY) ×3 IMPLANT
PAD ARMBOARD 7.5X6 YLW CONV (MISCELLANEOUS) ×6 IMPLANT
PATTIES SURGICAL .5 X.5 (GAUZE/BANDAGES/DRESSINGS) IMPLANT
PIN BONE FIX 100 (PIN) IMPLANT
ROD 5.5 CCM PERC 40 (Rod) IMPLANT
SCREW MAS VOYAGER 6.5X40 (Screw) IMPLANT
SCREW SET 5.5/6.0MM SOLERA (Screw) IMPLANT
SPIKE FLUID TRANSFER (MISCELLANEOUS) ×3 IMPLANT
SPONGE T-LAP 4X18 ~~LOC~~+RFID (SPONGE) IMPLANT
SUT MNCRL AB 3-0 PS2 18 (SUTURE) ×3 IMPLANT
SUT VIC AB 0 CT1 18XCR BRD8 (SUTURE) IMPLANT
SUT VIC AB 0 CT1 8-18 (SUTURE)
SUT VIC AB 2-0 CP2 18 (SUTURE) ×3 IMPLANT
SYR 3ML LL SCALE MARK (SYRINGE) IMPLANT
TOWEL GREEN STERILE (TOWEL DISPOSABLE) ×3 IMPLANT
TOWEL GREEN STERILE FF (TOWEL DISPOSABLE) ×3 IMPLANT
TRAY FOLEY MTR SLVR 16FR STAT (SET/KITS/TRAYS/PACK) IMPLANT
WATER STERILE IRR 1000ML POUR (IV SOLUTION) ×3 IMPLANT

## 2022-10-22 NOTE — Anesthesia Postprocedure Evaluation (Signed)
Anesthesia Post Note  Patient: [name]  Procedure(s) Performed: LUMBAR THREE-FOUR MINIMALLY INVASIVE TRANSFORAMINAL LUMBAR INTERBODY FUSION (Left) Application of O-Arm     Patient location during evaluation: PACU Anesthesia Type: General Level of consciousness: awake and alert Pain management: pain level controlled Vital Signs Assessment: post-procedure vital signs reviewed and stable Respiratory status: spontaneous breathing, nonlabored ventilation, respiratory function stable and patient connected to nasal cannula oxygen Cardiovascular status: blood pressure returned to baseline and stable Postop Assessment: no apparent nausea or vomiting Anesthetic complications: no   No notable events documented.  Last Vitals:  Vitals:   10/22/22 2000 10/22/22 2033  BP: (!) 145/79 (!) 173/89  Pulse: 70 70  Resp: 15 16  Temp: 36.6 C 36.6 C  SpO2: 99% 99%    Last Pain:  Vitals:   10/22/22 2033  TempSrc: Oral  PainSc:                  [name]

## 2022-10-22 NOTE — H&P (Signed)
Surgical H&P Update  HPI: 77 y.o. with a history of low back pain. Workup showed a mobile spondylolisthesis at L3-4. I sent her for a L3-4 MBBs with good benefit, but unfortunately RFA provided no improvement, I therefore recommended an MIS TLIF at L3-4. No changes in health since they were last seen. Still having the above and wishes to proceed with surgery.  PMHx:  Past Medical History:  Diagnosis Date   Bilateral leg cramps    Bladder neoplasm    Cancer (HCC)    CHF (congestive heart failure) (HCC)    Chronic deep vein thrombosis (DVT) of right lower extremity ([place])    05/ 2018  chronic non-occlusive DVT RLE   Chronic kidney disease    COPD, frequent exacerbations ([place])    03-06-2018 per pt last exacerbation 12/ 2018   Coronary artery disease    cardiologist-  dr Aundra Dubin-- 03-11-2018 er cath , 80% stenosis in the ostial second diagonal   Dyspnea on minimal exertion    Emphysema/COPD ([place])    CAT score- 17   Family history of breast cancer    Family history of colon cancer    Family history of melanoma    Family history of ovarian cancer    Family history of pancreatic cancer    Fibromyalgia 1995   GERD (gastroesophageal reflux disease)    Hiatal hernia    History of breast cancer    History of diverticulitis of colon    2002  s/p  sigmoid colectomy   History of multiple pulmonary nodules    hx RUL nodules x2  s/p right VATS w/ wedge resection 09-11-2005 and 06-14-2011  both  necrotizing granulomatous inflammation w/ cystic area of necrosis & focal calcification   History of right breast cancer oncologist-  dr Jana Hakim-- no recurrence   dx 2010--  IDC, Stage IA , ER/PR+,  (pT1c pN0) 11-09-2008 right lumpectomy;  12-18-2008 right simple mastectomy for DCIS margins;  completed chemotherapy 2010 (no radiation) and completed antiestrogen therapy   Hx of colonic polyps    Dr. 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   Nocturia    OA (osteoarthritis)    thumbs   On supplemental oxygen therapy    03-06-2018 per pt uses only at night,  checks O2 sats at home,  stated am sat 89% after moving around average 93-94% with RA   Osteoporosis    Peripheral arterial occlusive disease ([place]) vascular-- dr chen/ dr Donzetta Matters   proximal right SFA severe focal stenosis with collaterals from the PFA/  04/ 2012 occluded celiac and SMA arteries with distal reconstitution w/ patent IMA   Peripheral vascular disease ([place])    chronic DVT RLE,  mesenteric vascular disease   Pneumonia    Right lower lobe pulmonary nodule    Chest CT 08-29-2017   Sleep apnea    Stage 3 severe COPD by GOLD classification ([place]) hx frequent exacerbations--   pulmologist-  dr Maryann Alar--  per lov note , dated 12-20-2017, oxyogen 2L is prescribed for use with exertion (but pt only uses mostly at night), O2 sats on RA run the 80s , this day sat 86% RA and with 2L O2 sat 92%   Varicose veins of leg with swelling    varicose vein surgery - Dr. Aleda Grana   Wears glasses    Wears hearing aid in  both ears    FamHx:  Family History  Problem Relation Age of Onset   Colon cancer Mother        colon   COPD Mother        brown lung   Hypertension Mother    Diabetes Mother    Emphysema Mother    Coronary artery disease Father    Heart attack Father    Sudden death Father    Heart disease Father    Cancer Sister        throat   Hypertension Sister    Coronary artery disease Brother    Heart attack Brother        early 41s   Throat cancer Sister    Ovarian cancer Sister        fallopian tube cancer in her 83s   Melanoma Sister    Hypertension Sister    Pancreatic cancer Sister    Melanoma Niece        dx in her 43s   Breast cancer Niece 25   SocHx:  reports that she has been smoking cigarettes. She has a 12.50 pack-year smoking history. She has never used smokeless tobacco. She reports current  alcohol use of about 14.0 standard drinks of alcohol per week. She reports that she does not use drugs.  Physical Exam: Strength 5/5 x4 and SILTx4   Imaging: MRI with small L3-4 spondy, increases on uprights consistent with a mobile sopndylolisthesis at L3-4.  Assesment/Plan: 77 y.o. woman with L3-4 mobile spondylolisthesis, here for L3-4 MIS TLIF. Risks, benefits, and alternatives discussed and the patient would like to continue with surgery.  -OR today -3C post-op  Judith Part, MD 10/22/22 3:14 PM

## 2022-10-22 NOTE — Transfer of Care (Signed)
Immediate Anesthesia Transfer of Care Note  Patient: [name]  Procedure(s) Performed: LUMBAR THREE-FOUR MINIMALLY INVASIVE TRANSFORAMINAL LUMBAR INTERBODY FUSION (Left) Application of O-Arm  Patient Location: PACU  Anesthesia Type:General  Level of Consciousness: awake and alert   Airway & Oxygen Therapy: Patient Spontanous Breathing and Patient connected to face mask oxygen  Post-op Assessment: Report given to RN, Post -op Vital signs reviewed and stable, and Patient moving all extremities X 4  Post vital signs: Reviewed and stable  Last Vitals:  Vitals Value Taken Time  BP 160/87 10/22/22 1849  Temp    Pulse 81 10/22/22 1853  Resp 22 10/22/22 1853  SpO2 95 % 10/22/22 1853  Vitals shown include unvalidated device data.  Last Pain:  Vitals:   10/22/22 1243  TempSrc:   PainSc: 0-No pain         Complications: No notable events documented.

## 2022-10-22 NOTE — Anesthesia Procedure Notes (Signed)
Procedure Name: Intubation Date/Time: 10/22/2022 4:09 PM  Performed by: Carolan Clines, CRNAPre-anesthesia Checklist: Patient identified, Emergency Drugs available, Suction available and Patient being monitored Patient Re-evaluated:Patient Re-evaluated prior to induction Oxygen Delivery Method: Circle System Utilized Preoxygenation: Pre-oxygenation with 100% oxygen Induction Type: IV induction Ventilation: Mask ventilation without difficulty Laryngoscope Size: Mac and 4 Grade View: Grade II Tube type: Oral Tube size: 7.0 mm Number of attempts: 1 Airway Equipment and Method: Stylet and Oral airway Placement Confirmation: ETT inserted through vocal cords under direct vision, positive ETCO2 and breath sounds checked- equal and bilateral Secured at: 22 cm Tube secured with: Tape Dental Injury: Teeth and Oropharynx as per pre-operative assessment

## 2022-10-22 NOTE — Op Note (Signed)
PATIENT: [name]  DAY OF SURGERY: 10/22/22   PRE-OPERATIVE DIAGNOSIS:  Lumbar spondylolisthesis   POST-OPERATIVE DIAGNOSIS:  Same   PROCEDURE:  L3-4 minimally invasive transforaminal lumbar interbody fusion, use of frameless stereotaxy and intra-operative CT scan   SURGEON:  Surgeon(s) and Role:    Judith Part, MD - Primary    Norm Parcel PA - Assisting   ANESTHESIA: ETGA   BRIEF HISTORY: This is a 77 year old woman who presented with low back pain and an L3-4 mobile spondylolisthesis. She had a good response to MBB at L3-4 but unfortunately no lasting relief with RFA. I therefore recommended an MIS TLIF at that level. This was discussed with the patient as well as risks, benefits, and alternatives and wished to proceed with surgery.   OPERATIVE DETAIL:  The patient was taken to the operating room and placed on the OR table in the prone position. A formal time out was performed with two patient identifiers and confirmed the operative site. Anesthesia was induced by the anesthesia team. The operative site was marked, hair was clipped with surgical clippers, the area was then prepped and draped in a sterile fashion.   A small incision was made over the PSIS and a percutaneous hip pain was placed into the pelvis and connected to a reference array for use with frameless navigation. The field was covered and the O-arm was brought into the field. An intraoperative CT was obtained and transferred to the Stealth station, which was registered to the patient's anatomy using the registration frame. The fit appeared to be acceptable. Stereotactic spinal navigation was utilized throughout the procedure for planning and placement of pedicle screw trajectories. The pedicles were marked and used to create skin incisions bilaterally. Contralateral to the TLIF, percutaneous pedicle screws were placed at L3 and L4 using stereotactic navigation. These were placed by creating a navigated pilot  hole then using a navigated awl-tap, palpated, and the screws were placed. Ipsilateral to the TLIF, this was repeated but the trajectories were created, palpated, tapped, but screws were not placed to keep the screw heads from obscuring visualization during the decompression.  Using stereotactic guidance, a Met[employer] tube was then docked to the left L3-4 facet and decompression was performed.  A left L3-4 complete facetectomy was then performed and the left L3 nerve root was decompressed along its entire course. The disc space was identified, incised, and a discectomy was performed in the standard fashion. Using navigated instruments, the endplates were prepped, bone graft was packed into the disc space, and an expandable cage ([employer]tr[employer]) was packed with autograft and placed into the disc space using navigation. The tube was removed and hemostasis was obtained during its removal.   Ipsilateral instrumentation was then completed by placing the screws into the previously created trajectories. A rod was sized and introduced on both sides then final tightened. A final xray was used to confirm that the hardware was in good position. Hemostasis was again confirmed for both incisions, they were copiously irrigated, and then closed in layers.    EBL:  157mL   DRAINS: none   SPECIMENS: none   Judith Part, MD 10/22/22 6:29 PM

## 2022-10-23 DIAGNOSIS — I130 Hypertensive heart and chronic kidney disease with heart failure and stage 1 through stage 4 chronic kidney disease, or unspecified chronic kidney disease: Secondary | ICD-10-CM | POA: Diagnosis not present

## 2022-10-23 DIAGNOSIS — E039 Hypothyroidism, unspecified: Secondary | ICD-10-CM | POA: Diagnosis not present

## 2022-10-23 DIAGNOSIS — M4316 Spondylolisthesis, lumbar region: Secondary | ICD-10-CM | POA: Diagnosis not present

## 2022-10-23 DIAGNOSIS — I509 Heart failure, unspecified: Secondary | ICD-10-CM | POA: Diagnosis not present

## 2022-10-23 DIAGNOSIS — Z853 Personal history of malignant neoplasm of breast: Secondary | ICD-10-CM | POA: Diagnosis not present

## 2022-10-23 DIAGNOSIS — N183 Chronic kidney disease, stage 3 unspecified: Secondary | ICD-10-CM | POA: Diagnosis not present

## 2022-10-23 DIAGNOSIS — F17210 Nicotine dependence, cigarettes, uncomplicated: Secondary | ICD-10-CM | POA: Diagnosis not present

## 2022-10-23 DIAGNOSIS — J449 Chronic obstructive pulmonary disease, unspecified: Secondary | ICD-10-CM | POA: Diagnosis not present

## 2022-10-23 DIAGNOSIS — M5416 Radiculopathy, lumbar region: Secondary | ICD-10-CM | POA: Diagnosis not present

## 2022-10-23 DIAGNOSIS — I2510 Atherosclerotic heart disease of native coronary artery without angina pectoris: Secondary | ICD-10-CM | POA: Diagnosis not present

## 2022-10-23 DIAGNOSIS — Z86718 Personal history of other venous thrombosis and embolism: Secondary | ICD-10-CM | POA: Diagnosis not present

## 2022-10-23 MED ORDER — ALUM & MAG HYDROXIDE-SIMETH 200-200-20 MG/5ML PO SUSP: 30.0000 mL | Freq: Four times a day (QID) | ORAL | Status: DC | PRN

## 2022-10-23 MED ORDER — FUROSEMIDE 40 MG PO TABS
40.0000 mg | ORAL_TABLET | Freq: Two times a day (BID) | ORAL | Status: DC
  Administered 2022-10-23 – 2022-10-24 (×3): 40 mg via ORAL
  Filled 2022-10-23 (×3): qty 1

## 2022-10-23 NOTE — Progress Notes (Signed)
Neurosurgery Service Progress Note  Subjective: No acute events overnight,  some urinary retention 600cc s/p straight cath, +back pain, legs feel better  Objective: Vitals:   10/22/22 2309 10/23/22 0018 10/23/22 0310 10/23/22 0723  BP: (!) 115/59  121/64 122/70  Pulse: 92 100 99 87  Resp: 18  18 15   Temp: (!) 97.5 F (36.4 C)  97.8 F (36.6 C) 97.7 F (36.5 C)  TempSrc:      SpO2: 91% 92% 90% 96%  Weight:      Height:        Physical Exam: Strength 5/5 x4 and SILTx4   Assessment & Plan: 77 y.o. woman s/p L3-4 MIS TLIF, recovering well.  -urinary retention: IVF bolus right now given she takes lasix, will see if she can void later today, can straight cath prn, continue home tamsulosin -PT/OT -diet / activity as tolerated -cont home Rx for COPDs, CHF  Judith Part  10/23/22 8:19 AM

## 2022-10-23 NOTE — Evaluation (Signed)
Physical Therapy Evaluation Patient Details Name: [name] MRN: [id number] DOB: 01-10-1946 Today's Date: 10/23/2022  History of Present Illness  77 y.o. female admitted 10/22/22 for L3-4 MIS TLIF.   medical history significant of systolic CHF, hypertension, hypothyroidism, hyperlipidemia, PVD, GERD, PAD, CAD, OSA, chronic respiratory failure on 2L oxygen [place] prn, chronic mesenteric ischemia,  COPD, and breast cancer.  Clinical Impression  Patient is s/p above surgery resulting in functional limitations due to the deficits listed below (see PT Problem List). Focused on mobility with back precautions, requiring min assist for log roll and transition to sit at EOB. Min guard with transfers and ambulation, limited to 40 feet today 2/2 pain and nausea, but without evidence of LE buckling or overt LOB. Reliant on RW for support. Declines stair training, states her husband and son will assist her as needed at home. Patient will benefit from skilled PT to increase their independence and safety with mobility to allow discharge to the venue listed below.          Recommendations for follow up therapy are one component of a multi-disciplinary discharge planning process, led by the attending physician.  Recommendations may be updated based on patient status, additional functional criteria and insurance authorization.  Follow Up Recommendations Home health PT      Assistance Recommended at Discharge Frequent or constant Supervision/Assistance  Patient can return home with the following  A little help with walking and/or transfers;A little help with bathing/dressing/bathroom;Assistance with cooking/housework;Assist for transportation;Help with stairs or ramp for entrance    Equipment Recommendations Rolling walker (2 wheels)  Recommendations for Other Services       Functional Status Assessment Patient has had a recent decline in their functional status and demonstrates the ability to make significant  improvements in function in a reasonable and predictable amount of time.     Precautions / Restrictions Precautions Precautions: Back;Fall Precaution Booklet Issued: Yes (comment) Precaution Comments: reviewed Restrictions Weight Bearing Restrictions: No      Mobility  Bed Mobility Overal bed mobility: Needs Assistance Bed Mobility: Rolling, Sidelying to Sit Rolling: Min assist Sidelying to sit: Min assist       General bed mobility comments: Educated on log roll technique, cues for sequencing, min assist for trunk support, and cues to keep LEs off of bed with transition to sit.    Transfers Overall transfer level: Needs assistance Equipment used: Rolling walker (2 wheels) Transfers: Sit to/from Stand Sit to Stand: Min guard           General transfer comment: Min guard for safety, cues for technique. no physical assist required from low bed setting. Good control with descent into chair, cues for hand placement and knee bend.    Ambulation/Gait Ambulation/Gait assistance: Min guard Gait Distance (Feet): 40 Feet Assistive device: Rolling walker (2 wheels) Gait Pattern/deviations: Step-through pattern, Decreased stride length Gait velocity: slow Gait velocity interpretation: <1.31 ft/sec, indicative of household ambulator   General Gait Details: Slow and guarded. Min guard for safety without evidence of buckling. Cues for RW use and to maintain precautions.  Stairs Stairs:  (Declines)          Wheelchair Mobility    Modified Rankin (Stroke Patients Only)       Balance Overall balance assessment: Needs assistance Sitting-balance support: No upper extremity supported, Feet supported Sitting balance-Leahy Scale: Good     Standing balance support: No upper extremity supported, During functional activity Standing balance-Leahy Scale: Fair  Pertinent Vitals/Pain Pain Assessment Pain Assessment: 0-10 Pain  Score: 7  Pain Location: back Pain Descriptors / Indicators: Aching, Constant Pain Intervention(s): Monitored during session, Repositioned, Limited activity within patient's tolerance    Home Living Family/patient expects to be discharged to:: Private residence Living Arrangements: Spouse/significant other;Children Available Help at Discharge: Family;Available 24 hours/day Type of Home: House Home Access: Stairs to enter Entrance Stairs-Rails: Left Entrance Stairs-Number of Steps: 5-6 Alternate Level Stairs-Number of Steps: 9 Home Layout: Two level;Able to live on main level with bedroom/bathroom Home Equipment: Wheelchair - manual Additional Comments: Some minor changes in hx provided compared to prior note hx.    Prior Function Prior Level of Function : Needs assist             Mobility Comments: States she walks without AD but takes w/c to appointments when she leaves the house. Denies any falls       Hand Dominance   Dominant Hand: Right    Extremity/Trunk Assessment   Upper Extremity Assessment Upper Extremity Assessment: Defer to OT evaluation    Lower Extremity Assessment Lower Extremity Assessment: Generalized weakness       Communication   Communication: HOH  Cognition Arousal/Alertness: Awake/alert Behavior During Therapy: WFL for tasks assessed/performed Overall Cognitive Status: Within Functional Limits for tasks assessed                                          General Comments      Exercises     Assessment/Plan    PT Assessment Patient needs continued PT services  PT Problem List Decreased strength;Decreased range of motion;Decreased activity tolerance;Decreased balance;Decreased mobility;Decreased knowledge of use of DME;Decreased knowledge of precautions;Pain       PT Treatment Interventions DME instruction;Stair training;Gait training;Functional mobility training;Therapeutic activities;Therapeutic exercise;Balance  training;Neuromuscular re-education;Patient/family education    PT Goals (Current goals can be found in the Care Plan section)  Acute Rehab PT Goals Patient Stated Goal: Get well, go home PT Goal Formulation: With patient Time For Goal Achievement: 10/30/22 Potential to Achieve Goals: Good    Frequency Min 4X/week     Co-evaluation               AM-PAC PT "6 Clicks" Mobility  Outcome Measure Help needed turning from your back to your side while in a flat bed without using bedrails?: A Little Help needed moving from lying on your back to sitting on the side of a flat bed without using bedrails?: A Little Help needed moving to and from a bed to a chair (including a wheelchair)?: A Little Help needed standing up from a chair using your arms (e.g., wheelchair or bedside chair)?: A Little Help needed to walk in hospital room?: A Little Help needed climbing 3-5 steps with a railing? : A Lot 6 Click Score: 17    End of Session Equipment Utilized During Treatment: Gait belt Activity Tolerance: Patient limited by pain Patient left: in chair;with call bell/phone within reach Nurse Communication: Mobility status (missing single hearing aide) PT Visit Diagnosis: Unsteadiness on feet (R26.81);Other abnormalities of gait and mobility (R26.89);Muscle weakness (generalized) (M62.81);Difficulty in walking, not elsewhere classified (R26.2);Pain Pain - part of body:  (back)    Time: 2992-4268 PT Time Calculation (min) (ACUTE ONLY): 24 min   Charges:   PT Evaluation $PT Eval Low Complexity: 1 Low PT Treatments $Therapeutic Activity: 8-22 mins  Candie Mile, PT, DPT Physical Therapist Acute Rehabilitation Services [facility]   Ellouise Newer 10/23/2022, 10:34 AM

## 2022-10-23 NOTE — Evaluation (Signed)
Occupational Therapy Evaluation Patient Details Name: [name] MRN: [id number] DOB: 1946/07/16 Today's Date: 10/23/2022   History of Present Illness 77 y.o. female admitted 10/22/22 for L3-4 MIS TLIF.   medical history significant of systolic CHF, hypertension, hypothyroidism, hyperlipidemia, PVD, GERD, PAD, CAD, OSA, chronic respiratory failure on 2L oxygen [place] prn, chronic mesenteric ischemia,  COPD, and breast cancer.   Clinical Impression   Patient is s/p TLIF L3-4 surgery resulting in functional limitations due to the deficits listed below (see OT problem list). Pt at baseline is indep. Pt currently requires increased deep tone to hear therapist due to missing hearing aide this admission. Pt with good return demo and progression toward ADL goals. Next session to focus on LB dressing/ bathing.  Patient will benefit from skilled OT acutely to increase independence and safety with ADLS to allow discharge [place].       Recommendations for follow up therapy are one component of a multi-disciplinary discharge planning process, led by the attending physician.  Recommendations may be updated based on patient status, additional functional criteria and insurance authorization.   Follow Up Recommendations  Home health OT     Assistance Recommended at Discharge Set up Supervision/Assistance  Patient can return home with the following A little help with bathing/dressing/bathroom    Functional Status Assessment  Patient has had a recent decline in their functional status and demonstrates the ability to make significant improvements in function in a reasonable and predictable amount of time.  Equipment Recommendations  BSC/3in1;Other (comment) (RW)    Recommendations for Other Services       Precautions / Restrictions Precautions Precautions: Back;Fall Precaution Booklet Issued: Yes (comment) Precaution Comments: reviewed Restrictions Weight Bearing Restrictions: No      Mobility Bed  Mobility Overal bed mobility: Needs Assistance Bed Mobility: Rolling, Supine to Sit, Sit to Supine Rolling: Min guard   Supine to sit: Min guard Sit to supine: Min guard   General bed mobility comments: pt with min cues for back precautions and following 1 step cues well. pt benefits from "lay on shoulder" due to [facility]. pt completed sequence well this method.    Transfers Overall transfer level: Needs assistance Equipment used: Rolling walker (2 wheels) Transfers: Sit to/from Stand Sit to Stand: Min guard           General transfer comment: Min guard for safety, cues for technique. no physical assist required from low bed setting. Good control with descent into bed surface and toilet      Balance Overall balance assessment: Needs assistance Sitting-balance support: No upper extremity supported, Feet supported Sitting balance-Leahy Scale: Good     Standing balance support: No upper extremity supported, During functional activity Standing balance-Leahy Scale: Fair                             ADL either performed or assessed with clinical judgement   ADL Overall ADL's : Needs assistance/impaired Eating/Feeding: Independent   Grooming: Wash/dry hands;Min guard;Standing   Upper Body Bathing: Min guard;Sitting   Lower Body Bathing: Moderate assistance;Sit to/from stand           Toilet Transfer: Financial planner (2 wheels);Grab bars   Toileting- Water quality scientist and Hygiene: Min guard;Sit to/from stand       Functional mobility during ADLs: Min guard;Rolling walker (2 wheels)       Vision Baseline Vision/History: 0 No visual deficits Patient Visual Report: No change from  baseline       Perception     Praxis      Pertinent Vitals/Pain Pain Assessment Pain Assessment: Faces Faces Pain Scale: Hurts little more Pain Location: back Pain Descriptors / Indicators: Aching, Constant Pain Intervention(s): Monitored during  session, Premedicated before session, Repositioned     Hand Dominance Right   Extremity/Trunk Assessment Upper Extremity Assessment Upper Extremity Assessment: Overall WFL for tasks assessed   Lower Extremity Assessment Lower Extremity Assessment: Defer to PT evaluation   Cervical / Trunk Assessment Cervical / Trunk Assessment: Back Surgery   Communication Communication Communication: HOH   Cognition Arousal/Alertness: Awake/alert Behavior During Therapy: WFL for tasks assessed/performed Overall Cognitive Status: Within Functional Limits for tasks assessed                                       General Comments  able to locate only 1 hearing aide and charging in container next to patient in room. pt states "i apologize i have lost a hearing aide and i just dont hear well"    Exercises     Shoulder Instructions      Home Living Family/patient expects to be discharged to:: Private residence Living Arrangements: Spouse/significant other;Children Available Help at Discharge: Family;Available 24 hours/day Type of Home: House Home Access: Stairs to enter [employer] of Steps: 5-6 Entrance Stairs-Rails: Left Home Layout: Two level;Able to live on main level with bedroom/bathroom Alternate Level Stairs-Number of Steps: 9 Alternate Level Stairs-Rails: Left Bathroom Shower/Tub: Occupational psychologist: Handicapped height     Home Equipment: Wheelchair - manual          Prior Functioning/Environment Prior Level of Function : Needs assist             Mobility Comments: States she walks without AD but takes w/c to appointments when she leaves the house. Denies any falls          OT Problem List: Decreased activity tolerance;Impaired balance (sitting and/or standing);Decreased safety awareness;Decreased knowledge of use of DME or AE;Decreased knowledge of precautions      OT Treatment/Interventions: Self-care/ADL training;DME  and/or AE instruction;Therapeutic activities;Patient/family education;Balance training;Therapeutic exercise;Energy conservation    OT Goals(Current goals can be found in the care plan section) Acute Rehab OT Goals Patient Stated Goal: to lay down OT Goal Formulation: With patient Time For Goal Achievement: 11/06/22 Potential to Achieve Goals: Good  OT Frequency: Min 2X/week    Co-evaluation              AM-PAC OT "6 Clicks" Daily Activity     Outcome Measure Help from another person eating meals?: None Help from another person taking care of personal grooming?: A Little Help from another person toileting, which includes using toliet, bedpan, or urinal?: A Little Help from another person bathing (including washing, rinsing, drying)?: A Little Help from another person to put on and taking off regular upper body clothing?: A Little Help from another person to put on and taking off regular lower body clothing?: A Lot 6 Click Score: 18   End of Session Equipment Utilized During Treatment: Rolling walker (2 wheels) Nurse Communication: Mobility status;Precautions  Activity Tolerance: Patient tolerated treatment well Patient left: in bed;with call bell/phone within reach  OT Visit Diagnosis: Unsteadiness on feet (R26.81);Muscle weakness (generalized) (M62.81)                Time: 3212-2482  OT Time Calculation (min): 15 min Charges:  OT General Charges $OT Visit: 1 Visit OT Evaluation $OT Eval Moderate Complexity: 1 Mod   Brynn, OTR/L  Acute Rehabilitation Services Office: [phone] .   Jeri Modena 10/23/2022, 2:44 PM

## 2022-10-24 DIAGNOSIS — E039 Hypothyroidism, unspecified: Secondary | ICD-10-CM | POA: Diagnosis not present

## 2022-10-24 DIAGNOSIS — I2510 Atherosclerotic heart disease of native coronary artery without angina pectoris: Secondary | ICD-10-CM | POA: Diagnosis not present

## 2022-10-24 DIAGNOSIS — J449 Chronic obstructive pulmonary disease, unspecified: Secondary | ICD-10-CM | POA: Diagnosis not present

## 2022-10-24 DIAGNOSIS — F17210 Nicotine dependence, cigarettes, uncomplicated: Secondary | ICD-10-CM | POA: Diagnosis not present

## 2022-10-24 DIAGNOSIS — Z853 Personal history of malignant neoplasm of breast: Secondary | ICD-10-CM | POA: Diagnosis not present

## 2022-10-24 DIAGNOSIS — M4316 Spondylolisthesis, lumbar region: Secondary | ICD-10-CM | POA: Diagnosis not present

## 2022-10-24 DIAGNOSIS — Z86718 Personal history of other venous thrombosis and embolism: Secondary | ICD-10-CM | POA: Diagnosis not present

## 2022-10-24 DIAGNOSIS — M5416 Radiculopathy, lumbar region: Secondary | ICD-10-CM | POA: Diagnosis not present

## 2022-10-24 DIAGNOSIS — N183 Chronic kidney disease, stage 3 unspecified: Secondary | ICD-10-CM | POA: Diagnosis not present

## 2022-10-24 DIAGNOSIS — I130 Hypertensive heart and chronic kidney disease with heart failure and stage 1 through stage 4 chronic kidney disease, or unspecified chronic kidney disease: Secondary | ICD-10-CM | POA: Diagnosis not present

## 2022-10-24 DIAGNOSIS — I509 Heart failure, unspecified: Secondary | ICD-10-CM | POA: Diagnosis not present

## 2022-10-24 MED ORDER — OXYCODONE HCL 10 MG PO TABS: 10.0000 mg | ORAL_TABLET | ORAL | 0 refills | Status: DC | PRN

## 2022-10-24 MED ORDER — CYCLOBENZAPRINE HCL 10 MG PO TABS: 10.0000 mg | ORAL_TABLET | Freq: Three times a day (TID) | ORAL | 0 refills | Status: DC | PRN

## 2022-10-24 MED ORDER — CLOPIDOGREL BISULFATE 75 MG PO TABS: 75.0000 mg | ORAL_TABLET | Freq: Every day | ORAL | 3 refills | Status: DC

## 2022-10-24 NOTE — Progress Notes (Signed)
Physical Therapy Treatment Patient Details Name: [name] MRN: [id number] DOB: 21-Jun-1946 Today's Date: 10/24/2022   History of Present Illness 77 y.o. female admitted 10/22/22 for L3-4 MIS TLIF.   medical history significant of systolic CHF, hypertension, hypothyroidism, hyperlipidemia, PVD, GERD, PAD, CAD, OSA, chronic respiratory failure on 2L oxygen  prn, chronic mesenteric ischemia,  COPD, and breast cancer.    PT Comments    Pt progressing well towards all goals. Pt with improved ambulation tolerance and demonstrated safe stair negotiation utilizing L HR and sideways technique. Pt re-educated on back precautions. Pt with good home set up and support. Acute PT to cont to follow.    Recommendations for follow up therapy are one component of a multi-disciplinary discharge planning process, led by the attending physician.  Recommendations may be updated based on patient status, additional functional criteria and insurance authorization.  Follow Up Recommendations  Home health PT     Assistance Recommended at Discharge    Patient can return home with the following A little help with walking and/or transfers;A little help with bathing/dressing/bathroom;Assistance with cooking/housework;Assist for transportation;Help with stairs or ramp for entrance   Equipment Recommendations  Rolling walker (2 wheels)    Recommendations for Other Services       Precautions / Restrictions Precautions Precautions: Back;Fall Precaution Booklet Issued: Yes (comment) Precaution Comments: pt able to recall 2/3 with cueing, pt also very HOH Restrictions Weight Bearing Restrictions: No     Mobility  Bed Mobility               General bed mobility comments: pt up in chair upon PT arrival    Transfers Overall transfer level: Needs assistance Equipment used: Rolling walker (2 wheels) Transfers: Sit to/from Stand Sit to Stand: Min guard           General transfer comment: Min guard  for safety, cues for safe hand placement. no physical assist required from chair    Ambulation/Gait Ambulation/Gait assistance: Min guard Gait Distance (Feet): 100 Feet Assistive device: Rolling walker (2 wheels) Gait Pattern/deviations: Step-through pattern, Decreased stride length Gait velocity: slow Gait velocity interpretation: <1.31 ft/sec, indicative of household ambulator   General Gait Details: Slow and guarded. Min guard for safety without evidence of buckling. RW adjusted to optimal height to provide optimal support, pt reports "that's much better", good walker management   Stairs Stairs: Yes Stairs assistance: Min guard Stair Management: One rail Left, Step to pattern, Sideways Number of Stairs: 6 (to mimic home set up) General stair comments: used L hand rail and sideway method to allow for pt to use bilat UE for support and to ascend with her stronger LE (R) and then using R HR descending to be able to descend leading with weaker LE (L). pt educated on "Up with the good, down with the bad." technique written on precaution hand out. pt with good understanding   Wheelchair Mobility    Modified Rankin (Stroke Patients Only)       Balance Overall balance assessment: Needs assistance Sitting-balance support: No upper extremity supported, Feet supported Sitting balance-Leahy Scale: Good     Standing balance support: No upper extremity supported, During functional activity Standing balance-Leahy Scale: Fair Standing balance comment: benefits from RW for safe amb                            Cognition Arousal/Alertness: Awake/alert Behavior During Therapy: WFL for tasks assessed/performed Overall Cognitive Status: Within  Functional Limits for tasks assessed                                 General Comments: pt extremely HOH, more responsive to written communication        Exercises      General Comments General comments (skin integrity,  edema, etc.): VSS, pt educated on using folded blanket or pillow to hug over chest when needing to cough to help minimize pain yet promote productive cough, pt appreciated and was able to get up mucous without significant increase in pain      Pertinent Vitals/Pain Pain Assessment Pain Assessment: 0-10 Pain Score: 7  Pain Location: back Pain Descriptors / Indicators: Aching, Constant    Home Living                          Prior Function            PT Goals (current goals can now be found in the care plan section) Acute Rehab PT Goals Patient Stated Goal: Get well, go home PT Goal Formulation: With patient Time For Goal Achievement: 10/30/22 Potential to Achieve Goals: Good Progress towards PT goals: Progressing toward goals    Frequency    Min 4X/week      PT Plan Current plan remains appropriate    Co-evaluation              AM-PAC PT "6 Clicks" Mobility   Outcome Measure  Help needed turning from your back to your side while in a flat bed without using bedrails?: A Little Help needed moving from lying on your back to sitting on the side of a flat bed without using bedrails?: A Little Help needed moving to and from a bed to a chair (including a wheelchair)?: A Little Help needed standing up from a chair using your arms (e.g., wheelchair or bedside chair)?: A Little Help needed to walk in hospital room?: A Little Help needed climbing 3-5 steps with a railing? : A Lot 6 Click Score: 17    End of Session Equipment Utilized During Treatment: Gait belt Activity Tolerance: Patient limited by pain Patient left: in chair;with call bell/phone within reach Nurse Communication: Mobility status PT Visit Diagnosis: Unsteadiness on feet (R26.81);Other abnormalities of gait and mobility (R26.89);Muscle weakness (generalized) (M62.81);Difficulty in walking, not elsewhere classified (R26.2);Pain Pain - part of body:  (back)     Time: 2111-7356 PT Time  Calculation (min) (ACUTE ONLY): 21 min  Charges:  $Gait Training: 8-22 mins                     Kittie Plater, PT, DPT Acute Rehabilitation Services Secure chat preferred Office #: [phone]    Berline Lopes 10/24/2022, 10:09 AM

## 2022-10-24 NOTE — Plan of Care (Signed)
Pt doing well. Pt and husband given D/C instructions with verbal understanding. Rx's were sent to the pharmacy by MD. Pt's incision is clean and dry with no sign of infection. Pt's IV was removed prior to D/C. Pt received RW from [employer] per MD order. Home Health was arranged by TOC prior to D/C. Pt D/C'd home via wheelchair per MD order. Pt is stable @ D/C and has no other needs at this time. Holli Humbles, RN

## 2022-10-24 NOTE — TOC Transition Note (Signed)
Transition of Care [facility]) - CM/SW Discharge Note   Patient Details  Name: [name] MRN: [id number] Date of Birth: 1946/09/05  Transition of Care [facility]) CM/SW Contact:  Pollie Friar, RN Phone Number: 10/24/2022, 11:34 AM   Clinical Narrative:    Pt is discharging home with home health services through [place]. Information on the AVS.  Pt has transportation home.    Final next level of care: Home w Home Health Services Barriers to Discharge: No Barriers Identified   Patient Goals and CMS Choice CMS Medicare.gov Compare Post Acute Care list provided to:: Patient Choice offered to / list presented to : Patient  Discharge Placement                         Discharge Plan and Services Additional resources added to the After Visit Summary for                            [facility] Arranged: PT, OT, Nurse's Aide [facility] Agency: [place] Date [facility] Agency Contacted: 10/24/22   Representative spoke with at [place]: [place] Determinants of Health ([place]) Interventions SDOH Screenings   Food Insecurity: No Food Insecurity (12/21/2020)  Housing: Low Risk  (12/21/2020)  Transportation Needs: No Transportation Needs (12/21/2020)  Depression (PHQ2-9): High Risk (06/29/2022)  Financial Resource Strain: Low Risk  (12/21/2020)  Physical Activity: Inactive (12/21/2020)  Social Connections: Socially Isolated (12/21/2020)  Stress: No Stress Concern Present (12/21/2020)  Tobacco Use: High Risk (10/22/2022)     Readmission Risk Interventions    08/02/2021   11:16 AM  Readmission Risk Prevention Plan  Transportation Screening Complete  Medication Review ([place]) Complete  PCP or Specialist appointment within 3-5 days of discharge Complete  HRI or [place] Complete  SW Recovery Care/Counseling Consult Complete  [place] Not Applicable

## 2022-10-24 NOTE — Progress Notes (Signed)
Occupational Therapy Treatment Patient Details Name: [name] MRN: [id number] DOB: 1946/04/22 Today's Date: 10/24/2022   History of present illness 77 y.o. female admitted 10/22/22 for L3-4 MIS TLIF.   medical history significant of systolic CHF, hypertension, hypothyroidism, hyperlipidemia, PVD, GERD, PAD, CAD, OSA, chronic respiratory failure on 2L oxygen [place] prn, chronic mesenteric ischemia,  COPD, and breast cancer.   OT comments  Pt requires written communication during session as pt extremely HOH. Pt with written communication about to recall information. Pt asked throughout session with teach back to ensure information was being understood by patient. Pt very small guarded steps with RW ( decreased gait velocity). Recommendation for HHOT.    Recommendations for follow up therapy are one component of a multi-disciplinary discharge planning process, led by the attending physician.  Recommendations may be updated based on patient status, additional functional criteria and insurance authorization.    Follow Up Recommendations  Home health OT     Assistance Recommended at Discharge Set up Supervision/Assistance  Patient can return home with the following  A little help with bathing/dressing/bathroom   Equipment Recommendations  BSC/3in1;Other (comment)    Recommendations for Other Services      Precautions / Restrictions Precautions Precautions: Back;Fall Precaution Booklet Issued: Yes (comment) Precaution Comments: HOH requires written communication to help with precautions and communication due to Hearing aide not charged and second one lost Restrictions Weight Bearing Restrictions: No       Mobility Bed Mobility Overal bed mobility: Needs Assistance Bed Mobility: Rolling, Supine to Sit     Supine to sit: Min guard     General bed mobility comments: cues for back precautions and removal of rail to roll    Transfers Overall transfer level: Needs  assistance Equipment used: Rolling walker (2 wheels) Transfers: Sit to/from Stand Sit to Stand: Min guard                 Balance                                           ADL either performed or assessed with clinical judgement   ADL Overall ADL's : Needs assistance/impaired Eating/Feeding: Independent   Grooming: Oral care;Min guard           Upper Body Dressing : Min guard;Sitting   Lower Body Dressing: Minimal assistance;With adaptive equipment;Adhering to back precautions;Sit to/from stand Lower Body Dressing Details (indicate cue type and reason): pt needs (A) with AE. pt with poor return demo of reacher use. pt not pulling the trigger and then at times not releasing. pt reports PTA spouse was helping dress Toilet Transfer: Min guard;Regular Toilet;Rolling walker (2 wheels);Grab bars   Toileting- Clothing Manipulation and Hygiene: Min guard;Sit to/from stand       Functional mobility during ADLs: Min guard;Rolling walker (2 wheels)      Extremity/Trunk Assessment Upper Extremity Assessment Upper Extremity Assessment: Overall WFL for tasks assessed   Lower Extremity Assessment Lower Extremity Assessment: Defer to PT evaluation        Vision   Vision Assessment?: No apparent visual deficits   Perception     Praxis      Cognition Arousal/Alertness: Awake/alert Behavior During Therapy: WFL for tasks assessed/performed Overall Cognitive Status: Within Functional Limits for tasks assessed  General Comments: very HOH, responds well to written communication        Exercises      Shoulder Instructions       General Comments pt DOE 2 out 4 but reports "i feel fine" pt only uses supplemental OT at night. pt very guarded in posture and needs cues to relax into chair.    Pertinent Vitals/ Pain       Pain Assessment Pain Assessment: 0-10 Pain Score: 6  Pain Location: back Pain  Descriptors / Indicators: Operative site guarding Pain Intervention(s): Patient requesting pain meds-RN notified, RN gave pain meds during session, Repositioned, Premedicated before session, Monitored during session, Limited activity within patient's tolerance  Home Living                                          Prior Functioning/Environment              Frequency  Min 2X/week        Progress Toward Goals  OT Goals(current goals can now be found in the care plan section)  Progress towards OT goals: Progressing toward goals  Acute Rehab OT Goals Patient Stated Goal: to charge hearing aide. OT put in charger again. OT Goal Formulation: With patient Time For Goal Achievement: 11/06/22 Potential to Achieve Goals: Good  Plan Discharge plan remains appropriate    Co-evaluation                 AM-PAC OT "6 Clicks" Daily Activity     Outcome Measure   Help from another person eating meals?: None Help from another person taking care of personal grooming?: A Little Help from another person toileting, which includes using toliet, bedpan, or urinal?: A Little Help from another person bathing (including washing, rinsing, drying)?: A Little Help from another person to put on and taking off regular upper body clothing?: A Little Help from another person to put on and taking off regular lower body clothing?: A Lot 6 Click Score: 18    End of Session Equipment Utilized During Treatment: Rolling walker (2 wheels)  OT Visit Diagnosis: Unsteadiness on feet (R26.81);Muscle weakness (generalized) (M62.81)   Activity Tolerance Patient tolerated treatment well   Patient Left in chair;with call bell/phone within reach   Nurse Communication Mobility status;Precautions        Time: 5498-2641 OT Time Calculation (min): 31 min  Charges: OT General Charges $OT Visit: 1 Visit OT Treatments $Self Care/Home Management : 23-37 mins   B[place]nn, OTR/L  Acute  Rehabilitation Services Office: [phone] .   Jeri Modena 10/24/2022, 10:19 AM

## 2022-10-24 NOTE — Progress Notes (Signed)
Neurosurgery Service Progress Note  Subjective: No acute events overnight. Urinary retention resolved. LE radiculopathy improved. Back pain as expected, but well controlled with medications.    Objective: Vitals:   10/23/22 1950 10/23/22 2323 10/24/22 0227 10/24/22 0720  BP: (!) 140/80 112/62 110/66 113/80  Pulse: 76 88 93 87  Resp: 20 20  18   Temp: 97.8 F (36.6 C) 98.4 F (36.9 C) 98.1 F (36.7 C) 98.2 F (36.8 C)  TempSrc: Oral Oral Oral Oral  SpO2: 97% 91% 96% 93%  Weight:      Height:        Physical Exam: Strength 5/5 x4 and SILTx4, incision c/d/I   Assessment & Plan: 77 y.o. woman s/p L3-4 MIS TLIF, recovering well   -PT/OT  -discharge home   Norm Parcel, PA-C 10/24/22 9:10 AM

## 2022-10-24 NOTE — Discharge Summary (Signed)
Discharge Summary  Date of Admission: 10/22/2022  Date of Discharge: 10/24/22  Attending Physician: Emelda Brothers, MD  Hospital Course: Patient was admitted following an uncomplicated F5-3 MIS TLIF. They were recovered in PACU and transferred to [facility]. Their preop symptoms were improved, their hospital course was uncomplicated and the patient was discharged home today with HH/PT/OT. They will follow up in clinic with me in clinic in 2 weeks.  Neurologic exam at discharge:  Strength 5/5 x4 and SILTx4, incision c/d/I   Discharge diagnosis: lumbar radiculopathy   Collene Schlichter, PA-C 10/24/22 9:12 AM

## 2022-10-26 ENCOUNTER — Encounter (HOSPITAL_COMMUNITY): Payer: Self-pay | Admitting: Neurological Surgery

## 2022-10-27 DIAGNOSIS — E039 Hypothyroidism, unspecified: Secondary | ICD-10-CM | POA: Diagnosis not present

## 2022-10-27 DIAGNOSIS — M5416 Radiculopathy, lumbar region: Secondary | ICD-10-CM | POA: Diagnosis not present

## 2022-10-27 DIAGNOSIS — Z853 Personal history of malignant neoplasm of breast: Secondary | ICD-10-CM | POA: Diagnosis not present

## 2022-10-27 DIAGNOSIS — Z9981 Dependence on supplemental oxygen: Secondary | ICD-10-CM | POA: Diagnosis not present

## 2022-10-27 DIAGNOSIS — I2510 Atherosclerotic heart disease of native coronary artery without angina pectoris: Secondary | ICD-10-CM | POA: Diagnosis not present

## 2022-10-27 DIAGNOSIS — Z4789 Encounter for other orthopedic aftercare: Secondary | ICD-10-CM | POA: Diagnosis not present

## 2022-10-27 DIAGNOSIS — Z981 Arthrodesis status: Secondary | ICD-10-CM | POA: Diagnosis not present

## 2022-10-27 DIAGNOSIS — Z79899 Other long term (current) drug therapy: Secondary | ICD-10-CM | POA: Diagnosis not present

## 2022-10-27 DIAGNOSIS — I110 Hypertensive heart disease with heart failure: Secondary | ICD-10-CM | POA: Diagnosis not present

## 2022-10-27 DIAGNOSIS — E785 Hyperlipidemia, unspecified: Secondary | ICD-10-CM | POA: Diagnosis not present

## 2022-10-27 DIAGNOSIS — Z9181 History of falling: Secondary | ICD-10-CM | POA: Diagnosis not present

## 2022-10-27 DIAGNOSIS — Z7951 Long term (current) use of inhaled steroids: Secondary | ICD-10-CM | POA: Diagnosis not present

## 2022-10-27 DIAGNOSIS — I739 Peripheral vascular disease, unspecified: Secondary | ICD-10-CM | POA: Diagnosis not present

## 2022-10-27 DIAGNOSIS — I5022 Chronic systolic (congestive) heart failure: Secondary | ICD-10-CM | POA: Diagnosis not present

## 2022-10-27 DIAGNOSIS — J9610 Chronic respiratory failure, unspecified whether with hypoxia or hypercapnia: Secondary | ICD-10-CM | POA: Diagnosis not present

## 2022-10-27 DIAGNOSIS — Z7902 Long term (current) use of antithrombotics/antiplatelets: Secondary | ICD-10-CM | POA: Diagnosis not present

## 2022-10-27 DIAGNOSIS — J449 Chronic obstructive pulmonary disease, unspecified: Secondary | ICD-10-CM | POA: Diagnosis not present

## 2022-10-27 DIAGNOSIS — K219 Gastro-esophageal reflux disease without esophagitis: Secondary | ICD-10-CM | POA: Diagnosis not present

## 2022-10-27 DIAGNOSIS — Z7982 Long term (current) use of aspirin: Secondary | ICD-10-CM | POA: Diagnosis not present

## 2022-10-27 DIAGNOSIS — M4316 Spondylolisthesis, lumbar region: Secondary | ICD-10-CM | POA: Diagnosis not present

## 2022-10-29 ENCOUNTER — Ambulatory Visit: Payer: Medicare Other

## 2022-10-29 DIAGNOSIS — Z9981 Dependence on supplemental oxygen: Secondary | ICD-10-CM | POA: Diagnosis not present

## 2022-10-29 DIAGNOSIS — I739 Peripheral vascular disease, unspecified: Secondary | ICD-10-CM | POA: Diagnosis not present

## 2022-10-29 DIAGNOSIS — I5022 Chronic systolic (congestive) heart failure: Secondary | ICD-10-CM | POA: Diagnosis not present

## 2022-10-29 DIAGNOSIS — E039 Hypothyroidism, unspecified: Secondary | ICD-10-CM | POA: Diagnosis not present

## 2022-10-29 DIAGNOSIS — M5416 Radiculopathy, lumbar region: Secondary | ICD-10-CM | POA: Diagnosis not present

## 2022-10-29 DIAGNOSIS — Z853 Personal history of malignant neoplasm of breast: Secondary | ICD-10-CM | POA: Diagnosis not present

## 2022-10-29 DIAGNOSIS — Z9181 History of falling: Secondary | ICD-10-CM | POA: Diagnosis not present

## 2022-10-29 DIAGNOSIS — E785 Hyperlipidemia, unspecified: Secondary | ICD-10-CM | POA: Diagnosis not present

## 2022-10-29 DIAGNOSIS — Z4789 Encounter for other orthopedic aftercare: Secondary | ICD-10-CM | POA: Diagnosis not present

## 2022-10-29 DIAGNOSIS — Z7951 Long term (current) use of inhaled steroids: Secondary | ICD-10-CM | POA: Diagnosis not present

## 2022-10-29 DIAGNOSIS — J9610 Chronic respiratory failure, unspecified whether with hypoxia or hypercapnia: Secondary | ICD-10-CM | POA: Diagnosis not present

## 2022-10-29 DIAGNOSIS — I2510 Atherosclerotic heart disease of native coronary artery without angina pectoris: Secondary | ICD-10-CM | POA: Diagnosis not present

## 2022-10-29 DIAGNOSIS — I110 Hypertensive heart disease with heart failure: Secondary | ICD-10-CM | POA: Diagnosis not present

## 2022-10-29 DIAGNOSIS — J449 Chronic obstructive pulmonary disease, unspecified: Secondary | ICD-10-CM | POA: Diagnosis not present

## 2022-10-29 DIAGNOSIS — Z7982 Long term (current) use of aspirin: Secondary | ICD-10-CM | POA: Diagnosis not present

## 2022-10-29 DIAGNOSIS — K219 Gastro-esophageal reflux disease without esophagitis: Secondary | ICD-10-CM | POA: Diagnosis not present

## 2022-10-29 DIAGNOSIS — M4316 Spondylolisthesis, lumbar region: Secondary | ICD-10-CM | POA: Diagnosis not present

## 2022-10-29 DIAGNOSIS — Z7902 Long term (current) use of antithrombotics/antiplatelets: Secondary | ICD-10-CM | POA: Diagnosis not present

## 2022-10-29 DIAGNOSIS — Z79899 Other long term (current) drug therapy: Secondary | ICD-10-CM | POA: Diagnosis not present

## 2022-10-29 DIAGNOSIS — Z981 Arthrodesis status: Secondary | ICD-10-CM | POA: Diagnosis not present

## 2022-10-30 ENCOUNTER — Other Ambulatory Visit: Payer: Self-pay | Admitting: Primary Care

## 2022-10-30 ENCOUNTER — Other Ambulatory Visit: Payer: Self-pay | Admitting: Family Medicine

## 2022-10-30 DIAGNOSIS — I739 Peripheral vascular disease, unspecified: Secondary | ICD-10-CM | POA: Diagnosis not present

## 2022-10-30 DIAGNOSIS — Z853 Personal history of malignant neoplasm of breast: Secondary | ICD-10-CM | POA: Diagnosis not present

## 2022-10-30 DIAGNOSIS — Z7951 Long term (current) use of inhaled steroids: Secondary | ICD-10-CM | POA: Diagnosis not present

## 2022-10-30 DIAGNOSIS — M4316 Spondylolisthesis, lumbar region: Secondary | ICD-10-CM | POA: Diagnosis not present

## 2022-10-30 DIAGNOSIS — I2510 Atherosclerotic heart disease of native coronary artery without angina pectoris: Secondary | ICD-10-CM | POA: Diagnosis not present

## 2022-10-30 DIAGNOSIS — Z981 Arthrodesis status: Secondary | ICD-10-CM | POA: Diagnosis not present

## 2022-10-30 DIAGNOSIS — E039 Hypothyroidism, unspecified: Secondary | ICD-10-CM | POA: Diagnosis not present

## 2022-10-30 DIAGNOSIS — Z9181 History of falling: Secondary | ICD-10-CM | POA: Diagnosis not present

## 2022-10-30 DIAGNOSIS — F17200 Nicotine dependence, unspecified, uncomplicated: Secondary | ICD-10-CM

## 2022-10-30 DIAGNOSIS — Z7982 Long term (current) use of aspirin: Secondary | ICD-10-CM | POA: Diagnosis not present

## 2022-10-30 DIAGNOSIS — Z79899 Other long term (current) drug therapy: Secondary | ICD-10-CM | POA: Diagnosis not present

## 2022-10-30 DIAGNOSIS — J9610 Chronic respiratory failure, unspecified whether with hypoxia or hypercapnia: Secondary | ICD-10-CM | POA: Diagnosis not present

## 2022-10-30 DIAGNOSIS — K219 Gastro-esophageal reflux disease without esophagitis: Secondary | ICD-10-CM | POA: Diagnosis not present

## 2022-10-30 DIAGNOSIS — I110 Hypertensive heart disease with heart failure: Secondary | ICD-10-CM | POA: Diagnosis not present

## 2022-10-30 DIAGNOSIS — Z7902 Long term (current) use of antithrombotics/antiplatelets: Secondary | ICD-10-CM | POA: Diagnosis not present

## 2022-10-30 DIAGNOSIS — Z4789 Encounter for other orthopedic aftercare: Secondary | ICD-10-CM | POA: Diagnosis not present

## 2022-10-30 DIAGNOSIS — Z9981 Dependence on supplemental oxygen: Secondary | ICD-10-CM | POA: Diagnosis not present

## 2022-10-30 DIAGNOSIS — J449 Chronic obstructive pulmonary disease, unspecified: Secondary | ICD-10-CM | POA: Diagnosis not present

## 2022-10-30 DIAGNOSIS — M5416 Radiculopathy, lumbar region: Secondary | ICD-10-CM | POA: Diagnosis not present

## 2022-10-30 DIAGNOSIS — E785 Hyperlipidemia, unspecified: Secondary | ICD-10-CM | POA: Diagnosis not present

## 2022-10-30 DIAGNOSIS — I5022 Chronic systolic (congestive) heart failure: Secondary | ICD-10-CM | POA: Diagnosis not present

## 2022-11-01 DIAGNOSIS — J9610 Chronic respiratory failure, unspecified whether with hypoxia or hypercapnia: Secondary | ICD-10-CM | POA: Diagnosis not present

## 2022-11-01 DIAGNOSIS — Z4789 Encounter for other orthopedic aftercare: Secondary | ICD-10-CM | POA: Diagnosis not present

## 2022-11-01 DIAGNOSIS — K219 Gastro-esophageal reflux disease without esophagitis: Secondary | ICD-10-CM | POA: Diagnosis not present

## 2022-11-01 DIAGNOSIS — Z981 Arthrodesis status: Secondary | ICD-10-CM | POA: Diagnosis not present

## 2022-11-01 DIAGNOSIS — M5416 Radiculopathy, lumbar region: Secondary | ICD-10-CM | POA: Diagnosis not present

## 2022-11-01 DIAGNOSIS — I739 Peripheral vascular disease, unspecified: Secondary | ICD-10-CM | POA: Diagnosis not present

## 2022-11-01 DIAGNOSIS — E785 Hyperlipidemia, unspecified: Secondary | ICD-10-CM | POA: Diagnosis not present

## 2022-11-01 DIAGNOSIS — Z7902 Long term (current) use of antithrombotics/antiplatelets: Secondary | ICD-10-CM | POA: Diagnosis not present

## 2022-11-01 DIAGNOSIS — Z7982 Long term (current) use of aspirin: Secondary | ICD-10-CM | POA: Diagnosis not present

## 2022-11-01 DIAGNOSIS — Z79899 Other long term (current) drug therapy: Secondary | ICD-10-CM | POA: Diagnosis not present

## 2022-11-01 DIAGNOSIS — E039 Hypothyroidism, unspecified: Secondary | ICD-10-CM | POA: Diagnosis not present

## 2022-11-01 DIAGNOSIS — Z853 Personal history of malignant neoplasm of breast: Secondary | ICD-10-CM | POA: Diagnosis not present

## 2022-11-01 DIAGNOSIS — I5022 Chronic systolic (congestive) heart failure: Secondary | ICD-10-CM | POA: Diagnosis not present

## 2022-11-01 DIAGNOSIS — I2510 Atherosclerotic heart disease of native coronary artery without angina pectoris: Secondary | ICD-10-CM | POA: Diagnosis not present

## 2022-11-01 DIAGNOSIS — M4316 Spondylolisthesis, lumbar region: Secondary | ICD-10-CM | POA: Diagnosis not present

## 2022-11-01 DIAGNOSIS — I110 Hypertensive heart disease with heart failure: Secondary | ICD-10-CM | POA: Diagnosis not present

## 2022-11-01 DIAGNOSIS — Z7951 Long term (current) use of inhaled steroids: Secondary | ICD-10-CM | POA: Diagnosis not present

## 2022-11-01 DIAGNOSIS — J449 Chronic obstructive pulmonary disease, unspecified: Secondary | ICD-10-CM | POA: Diagnosis not present

## 2022-11-01 DIAGNOSIS — Z9181 History of falling: Secondary | ICD-10-CM | POA: Diagnosis not present

## 2022-11-01 DIAGNOSIS — Z9981 Dependence on supplemental oxygen: Secondary | ICD-10-CM | POA: Diagnosis not present

## 2022-11-05 ENCOUNTER — Telehealth: Payer: Self-pay | Admitting: Family Medicine

## 2022-11-05 NOTE — Telephone Encounter (Signed)
[name] from [facility] call and stated he need a verbal order for occupation therapy 1 X a wk for 3 wk's for COPD Education ,pain control ,health promotion and ADL started on the 15th.

## 2022-11-05 NOTE — Telephone Encounter (Signed)
I left the VO on Don's voicemail.

## 2022-11-06 DIAGNOSIS — J9610 Chronic respiratory failure, unspecified whether with hypoxia or hypercapnia: Secondary | ICD-10-CM | POA: Diagnosis not present

## 2022-11-06 DIAGNOSIS — Z9981 Dependence on supplemental oxygen: Secondary | ICD-10-CM | POA: Diagnosis not present

## 2022-11-06 DIAGNOSIS — Z9181 History of falling: Secondary | ICD-10-CM | POA: Diagnosis not present

## 2022-11-06 DIAGNOSIS — M5416 Radiculopathy, lumbar region: Secondary | ICD-10-CM | POA: Diagnosis not present

## 2022-11-06 DIAGNOSIS — I2510 Atherosclerotic heart disease of native coronary artery without angina pectoris: Secondary | ICD-10-CM | POA: Diagnosis not present

## 2022-11-06 DIAGNOSIS — E039 Hypothyroidism, unspecified: Secondary | ICD-10-CM | POA: Diagnosis not present

## 2022-11-06 DIAGNOSIS — I739 Peripheral vascular disease, unspecified: Secondary | ICD-10-CM | POA: Diagnosis not present

## 2022-11-06 DIAGNOSIS — Z7982 Long term (current) use of aspirin: Secondary | ICD-10-CM | POA: Diagnosis not present

## 2022-11-06 DIAGNOSIS — Z7902 Long term (current) use of antithrombotics/antiplatelets: Secondary | ICD-10-CM | POA: Diagnosis not present

## 2022-11-06 DIAGNOSIS — Z4789 Encounter for other orthopedic aftercare: Secondary | ICD-10-CM | POA: Diagnosis not present

## 2022-11-06 DIAGNOSIS — Z853 Personal history of malignant neoplasm of breast: Secondary | ICD-10-CM | POA: Diagnosis not present

## 2022-11-06 DIAGNOSIS — M4316 Spondylolisthesis, lumbar region: Secondary | ICD-10-CM | POA: Diagnosis not present

## 2022-11-06 DIAGNOSIS — Z7951 Long term (current) use of inhaled steroids: Secondary | ICD-10-CM | POA: Diagnosis not present

## 2022-11-06 DIAGNOSIS — J449 Chronic obstructive pulmonary disease, unspecified: Secondary | ICD-10-CM | POA: Diagnosis not present

## 2022-11-06 DIAGNOSIS — K219 Gastro-esophageal reflux disease without esophagitis: Secondary | ICD-10-CM | POA: Diagnosis not present

## 2022-11-06 DIAGNOSIS — Z981 Arthrodesis status: Secondary | ICD-10-CM | POA: Diagnosis not present

## 2022-11-06 DIAGNOSIS — I5022 Chronic systolic (congestive) heart failure: Secondary | ICD-10-CM | POA: Diagnosis not present

## 2022-11-06 DIAGNOSIS — E785 Hyperlipidemia, unspecified: Secondary | ICD-10-CM | POA: Diagnosis not present

## 2022-11-06 DIAGNOSIS — I110 Hypertensive heart disease with heart failure: Secondary | ICD-10-CM | POA: Diagnosis not present

## 2022-11-06 DIAGNOSIS — Z79899 Other long term (current) drug therapy: Secondary | ICD-10-CM | POA: Diagnosis not present

## 2022-11-08 DIAGNOSIS — Z4789 Encounter for other orthopedic aftercare: Secondary | ICD-10-CM | POA: Diagnosis not present

## 2022-11-08 DIAGNOSIS — Z7902 Long term (current) use of antithrombotics/antiplatelets: Secondary | ICD-10-CM | POA: Diagnosis not present

## 2022-11-08 DIAGNOSIS — I739 Peripheral vascular disease, unspecified: Secondary | ICD-10-CM | POA: Diagnosis not present

## 2022-11-08 DIAGNOSIS — Z9181 History of falling: Secondary | ICD-10-CM | POA: Diagnosis not present

## 2022-11-08 DIAGNOSIS — J449 Chronic obstructive pulmonary disease, unspecified: Secondary | ICD-10-CM | POA: Diagnosis not present

## 2022-11-08 DIAGNOSIS — Z9981 Dependence on supplemental oxygen: Secondary | ICD-10-CM | POA: Diagnosis not present

## 2022-11-08 DIAGNOSIS — E785 Hyperlipidemia, unspecified: Secondary | ICD-10-CM | POA: Diagnosis not present

## 2022-11-08 DIAGNOSIS — Z853 Personal history of malignant neoplasm of breast: Secondary | ICD-10-CM | POA: Diagnosis not present

## 2022-11-08 DIAGNOSIS — J9610 Chronic respiratory failure, unspecified whether with hypoxia or hypercapnia: Secondary | ICD-10-CM | POA: Diagnosis not present

## 2022-11-08 DIAGNOSIS — I5022 Chronic systolic (congestive) heart failure: Secondary | ICD-10-CM | POA: Diagnosis not present

## 2022-11-08 DIAGNOSIS — Z79899 Other long term (current) drug therapy: Secondary | ICD-10-CM | POA: Diagnosis not present

## 2022-11-08 DIAGNOSIS — M4316 Spondylolisthesis, lumbar region: Secondary | ICD-10-CM | POA: Diagnosis not present

## 2022-11-08 DIAGNOSIS — I2510 Atherosclerotic heart disease of native coronary artery without angina pectoris: Secondary | ICD-10-CM | POA: Diagnosis not present

## 2022-11-08 DIAGNOSIS — K219 Gastro-esophageal reflux disease without esophagitis: Secondary | ICD-10-CM | POA: Diagnosis not present

## 2022-11-08 DIAGNOSIS — E039 Hypothyroidism, unspecified: Secondary | ICD-10-CM | POA: Diagnosis not present

## 2022-11-08 DIAGNOSIS — Z981 Arthrodesis status: Secondary | ICD-10-CM | POA: Diagnosis not present

## 2022-11-08 DIAGNOSIS — M5416 Radiculopathy, lumbar region: Secondary | ICD-10-CM | POA: Diagnosis not present

## 2022-11-08 DIAGNOSIS — Z7951 Long term (current) use of inhaled steroids: Secondary | ICD-10-CM | POA: Diagnosis not present

## 2022-11-08 DIAGNOSIS — I110 Hypertensive heart disease with heart failure: Secondary | ICD-10-CM | POA: Diagnosis not present

## 2022-11-08 DIAGNOSIS — Z7982 Long term (current) use of aspirin: Secondary | ICD-10-CM | POA: Diagnosis not present

## 2022-11-13 DIAGNOSIS — Z4789 Encounter for other orthopedic aftercare: Secondary | ICD-10-CM | POA: Diagnosis not present

## 2022-11-13 DIAGNOSIS — E039 Hypothyroidism, unspecified: Secondary | ICD-10-CM | POA: Diagnosis not present

## 2022-11-13 DIAGNOSIS — M4316 Spondylolisthesis, lumbar region: Secondary | ICD-10-CM | POA: Diagnosis not present

## 2022-11-13 DIAGNOSIS — Z7982 Long term (current) use of aspirin: Secondary | ICD-10-CM | POA: Diagnosis not present

## 2022-11-13 DIAGNOSIS — Z9981 Dependence on supplemental oxygen: Secondary | ICD-10-CM | POA: Diagnosis not present

## 2022-11-13 DIAGNOSIS — Z981 Arthrodesis status: Secondary | ICD-10-CM | POA: Diagnosis not present

## 2022-11-13 DIAGNOSIS — K219 Gastro-esophageal reflux disease without esophagitis: Secondary | ICD-10-CM | POA: Diagnosis not present

## 2022-11-13 DIAGNOSIS — Z7902 Long term (current) use of antithrombotics/antiplatelets: Secondary | ICD-10-CM | POA: Diagnosis not present

## 2022-11-13 DIAGNOSIS — J449 Chronic obstructive pulmonary disease, unspecified: Secondary | ICD-10-CM | POA: Diagnosis not present

## 2022-11-13 DIAGNOSIS — Z7951 Long term (current) use of inhaled steroids: Secondary | ICD-10-CM | POA: Diagnosis not present

## 2022-11-13 DIAGNOSIS — J9610 Chronic respiratory failure, unspecified whether with hypoxia or hypercapnia: Secondary | ICD-10-CM | POA: Diagnosis not present

## 2022-11-13 DIAGNOSIS — Z79899 Other long term (current) drug therapy: Secondary | ICD-10-CM | POA: Diagnosis not present

## 2022-11-13 DIAGNOSIS — E785 Hyperlipidemia, unspecified: Secondary | ICD-10-CM | POA: Diagnosis not present

## 2022-11-13 DIAGNOSIS — M5416 Radiculopathy, lumbar region: Secondary | ICD-10-CM | POA: Diagnosis not present

## 2022-11-13 DIAGNOSIS — I739 Peripheral vascular disease, unspecified: Secondary | ICD-10-CM | POA: Diagnosis not present

## 2022-11-13 DIAGNOSIS — Z853 Personal history of malignant neoplasm of breast: Secondary | ICD-10-CM | POA: Diagnosis not present

## 2022-11-13 DIAGNOSIS — I2510 Atherosclerotic heart disease of native coronary artery without angina pectoris: Secondary | ICD-10-CM | POA: Diagnosis not present

## 2022-11-13 DIAGNOSIS — I5022 Chronic systolic (congestive) heart failure: Secondary | ICD-10-CM | POA: Diagnosis not present

## 2022-11-13 DIAGNOSIS — Z9181 History of falling: Secondary | ICD-10-CM | POA: Diagnosis not present

## 2022-11-13 DIAGNOSIS — I110 Hypertensive heart disease with heart failure: Secondary | ICD-10-CM | POA: Diagnosis not present

## 2022-11-15 ENCOUNTER — Telehealth: Payer: Self-pay | Admitting: Adult Health

## 2022-11-15 DIAGNOSIS — I2510 Atherosclerotic heart disease of native coronary artery without angina pectoris: Secondary | ICD-10-CM | POA: Diagnosis not present

## 2022-11-15 DIAGNOSIS — J9610 Chronic respiratory failure, unspecified whether with hypoxia or hypercapnia: Secondary | ICD-10-CM | POA: Diagnosis not present

## 2022-11-15 DIAGNOSIS — Z9181 History of falling: Secondary | ICD-10-CM | POA: Diagnosis not present

## 2022-11-15 DIAGNOSIS — K219 Gastro-esophageal reflux disease without esophagitis: Secondary | ICD-10-CM | POA: Diagnosis not present

## 2022-11-15 DIAGNOSIS — I739 Peripheral vascular disease, unspecified: Secondary | ICD-10-CM | POA: Diagnosis not present

## 2022-11-15 DIAGNOSIS — Z7902 Long term (current) use of antithrombotics/antiplatelets: Secondary | ICD-10-CM | POA: Diagnosis not present

## 2022-11-15 DIAGNOSIS — I5022 Chronic systolic (congestive) heart failure: Secondary | ICD-10-CM | POA: Diagnosis not present

## 2022-11-15 DIAGNOSIS — Z7982 Long term (current) use of aspirin: Secondary | ICD-10-CM | POA: Diagnosis not present

## 2022-11-15 DIAGNOSIS — Z9981 Dependence on supplemental oxygen: Secondary | ICD-10-CM | POA: Diagnosis not present

## 2022-11-15 DIAGNOSIS — E039 Hypothyroidism, unspecified: Secondary | ICD-10-CM | POA: Diagnosis not present

## 2022-11-15 DIAGNOSIS — J449 Chronic obstructive pulmonary disease, unspecified: Secondary | ICD-10-CM | POA: Diagnosis not present

## 2022-11-15 DIAGNOSIS — Z79899 Other long term (current) drug therapy: Secondary | ICD-10-CM | POA: Diagnosis not present

## 2022-11-15 DIAGNOSIS — E785 Hyperlipidemia, unspecified: Secondary | ICD-10-CM | POA: Diagnosis not present

## 2022-11-15 DIAGNOSIS — M4316 Spondylolisthesis, lumbar region: Secondary | ICD-10-CM | POA: Diagnosis not present

## 2022-11-15 DIAGNOSIS — Z981 Arthrodesis status: Secondary | ICD-10-CM | POA: Diagnosis not present

## 2022-11-15 DIAGNOSIS — I110 Hypertensive heart disease with heart failure: Secondary | ICD-10-CM | POA: Diagnosis not present

## 2022-11-15 DIAGNOSIS — Z7951 Long term (current) use of inhaled steroids: Secondary | ICD-10-CM | POA: Diagnosis not present

## 2022-11-15 DIAGNOSIS — Z853 Personal history of malignant neoplasm of breast: Secondary | ICD-10-CM | POA: Diagnosis not present

## 2022-11-15 DIAGNOSIS — M5416 Radiculopathy, lumbar region: Secondary | ICD-10-CM | POA: Diagnosis not present

## 2022-11-15 DIAGNOSIS — Z4789 Encounter for other orthopedic aftercare: Secondary | ICD-10-CM | POA: Diagnosis not present

## 2022-11-15 NOTE — Telephone Encounter (Signed)
Rescheduled appointment per provider template. Patient is aware of the changes made to her upcoming appointment

## 2022-11-20 DIAGNOSIS — Z4789 Encounter for other orthopedic aftercare: Secondary | ICD-10-CM | POA: Diagnosis not present

## 2022-11-20 DIAGNOSIS — I110 Hypertensive heart disease with heart failure: Secondary | ICD-10-CM | POA: Diagnosis not present

## 2022-11-20 DIAGNOSIS — M5416 Radiculopathy, lumbar region: Secondary | ICD-10-CM | POA: Diagnosis not present

## 2022-11-20 DIAGNOSIS — J449 Chronic obstructive pulmonary disease, unspecified: Secondary | ICD-10-CM | POA: Diagnosis not present

## 2022-11-20 DIAGNOSIS — Z9981 Dependence on supplemental oxygen: Secondary | ICD-10-CM | POA: Diagnosis not present

## 2022-11-20 DIAGNOSIS — J9610 Chronic respiratory failure, unspecified whether with hypoxia or hypercapnia: Secondary | ICD-10-CM | POA: Diagnosis not present

## 2022-11-20 DIAGNOSIS — Z9181 History of falling: Secondary | ICD-10-CM | POA: Diagnosis not present

## 2022-11-20 DIAGNOSIS — Z7902 Long term (current) use of antithrombotics/antiplatelets: Secondary | ICD-10-CM | POA: Diagnosis not present

## 2022-11-20 DIAGNOSIS — Z7951 Long term (current) use of inhaled steroids: Secondary | ICD-10-CM | POA: Diagnosis not present

## 2022-11-20 DIAGNOSIS — I5022 Chronic systolic (congestive) heart failure: Secondary | ICD-10-CM | POA: Diagnosis not present

## 2022-11-20 DIAGNOSIS — Z853 Personal history of malignant neoplasm of breast: Secondary | ICD-10-CM | POA: Diagnosis not present

## 2022-11-20 DIAGNOSIS — Z79899 Other long term (current) drug therapy: Secondary | ICD-10-CM | POA: Diagnosis not present

## 2022-11-20 DIAGNOSIS — K219 Gastro-esophageal reflux disease without esophagitis: Secondary | ICD-10-CM | POA: Diagnosis not present

## 2022-11-20 DIAGNOSIS — I739 Peripheral vascular disease, unspecified: Secondary | ICD-10-CM | POA: Diagnosis not present

## 2022-11-20 DIAGNOSIS — E785 Hyperlipidemia, unspecified: Secondary | ICD-10-CM | POA: Diagnosis not present

## 2022-11-20 DIAGNOSIS — E039 Hypothyroidism, unspecified: Secondary | ICD-10-CM | POA: Diagnosis not present

## 2022-11-20 DIAGNOSIS — Z981 Arthrodesis status: Secondary | ICD-10-CM | POA: Diagnosis not present

## 2022-11-20 DIAGNOSIS — M4316 Spondylolisthesis, lumbar region: Secondary | ICD-10-CM | POA: Diagnosis not present

## 2022-11-20 DIAGNOSIS — I2510 Atherosclerotic heart disease of native coronary artery without angina pectoris: Secondary | ICD-10-CM | POA: Diagnosis not present

## 2022-11-20 DIAGNOSIS — Z7982 Long term (current) use of aspirin: Secondary | ICD-10-CM | POA: Diagnosis not present

## 2022-11-21 DIAGNOSIS — R0602 Shortness of breath: Secondary | ICD-10-CM | POA: Diagnosis not present

## 2022-11-21 DIAGNOSIS — J449 Chronic obstructive pulmonary disease, unspecified: Secondary | ICD-10-CM | POA: Diagnosis not present

## 2022-11-21 DIAGNOSIS — J9601 Acute respiratory failure with hypoxia: Secondary | ICD-10-CM | POA: Diagnosis not present

## 2022-11-22 DIAGNOSIS — M5416 Radiculopathy, lumbar region: Secondary | ICD-10-CM | POA: Diagnosis not present

## 2022-11-22 DIAGNOSIS — Z7902 Long term (current) use of antithrombotics/antiplatelets: Secondary | ICD-10-CM | POA: Diagnosis not present

## 2022-11-22 DIAGNOSIS — Z9181 History of falling: Secondary | ICD-10-CM | POA: Diagnosis not present

## 2022-11-22 DIAGNOSIS — Z7951 Long term (current) use of inhaled steroids: Secondary | ICD-10-CM | POA: Diagnosis not present

## 2022-11-22 DIAGNOSIS — M4316 Spondylolisthesis, lumbar region: Secondary | ICD-10-CM | POA: Diagnosis not present

## 2022-11-22 DIAGNOSIS — K219 Gastro-esophageal reflux disease without esophagitis: Secondary | ICD-10-CM | POA: Diagnosis not present

## 2022-11-22 DIAGNOSIS — I5022 Chronic systolic (congestive) heart failure: Secondary | ICD-10-CM | POA: Diagnosis not present

## 2022-11-22 DIAGNOSIS — I2510 Atherosclerotic heart disease of native coronary artery without angina pectoris: Secondary | ICD-10-CM | POA: Diagnosis not present

## 2022-11-22 DIAGNOSIS — I739 Peripheral vascular disease, unspecified: Secondary | ICD-10-CM | POA: Diagnosis not present

## 2022-11-22 DIAGNOSIS — Z7982 Long term (current) use of aspirin: Secondary | ICD-10-CM | POA: Diagnosis not present

## 2022-11-22 DIAGNOSIS — Z4789 Encounter for other orthopedic aftercare: Secondary | ICD-10-CM | POA: Diagnosis not present

## 2022-11-22 DIAGNOSIS — Z981 Arthrodesis status: Secondary | ICD-10-CM | POA: Diagnosis not present

## 2022-11-22 DIAGNOSIS — J9610 Chronic respiratory failure, unspecified whether with hypoxia or hypercapnia: Secondary | ICD-10-CM | POA: Diagnosis not present

## 2022-11-22 DIAGNOSIS — Z79899 Other long term (current) drug therapy: Secondary | ICD-10-CM | POA: Diagnosis not present

## 2022-11-22 DIAGNOSIS — E039 Hypothyroidism, unspecified: Secondary | ICD-10-CM | POA: Diagnosis not present

## 2022-11-22 DIAGNOSIS — J449 Chronic obstructive pulmonary disease, unspecified: Secondary | ICD-10-CM | POA: Diagnosis not present

## 2022-11-22 DIAGNOSIS — Z9981 Dependence on supplemental oxygen: Secondary | ICD-10-CM | POA: Diagnosis not present

## 2022-11-22 DIAGNOSIS — Z853 Personal history of malignant neoplasm of breast: Secondary | ICD-10-CM | POA: Diagnosis not present

## 2022-11-22 DIAGNOSIS — I110 Hypertensive heart disease with heart failure: Secondary | ICD-10-CM | POA: Diagnosis not present

## 2022-11-22 DIAGNOSIS — E785 Hyperlipidemia, unspecified: Secondary | ICD-10-CM | POA: Diagnosis not present

## 2022-11-28 ENCOUNTER — Ambulatory Visit (INDEPENDENT_AMBULATORY_CARE_PROVIDER_SITE_OTHER): Payer: 59 | Admitting: *Deleted

## 2022-11-28 DIAGNOSIS — E538 Deficiency of other specified B group vitamins: Secondary | ICD-10-CM

## 2022-11-28 MED ORDER — CYANOCOBALAMIN 1000 MCG/ML IJ SOLN
1000.0000 ug | Freq: Once | INTRAMUSCULAR | Status: AC
  Administered 2022-11-28: 1000 ug via INTRAMUSCULAR

## 2022-11-28 NOTE — Progress Notes (Signed)
Per orders of Dr. Martinique, injection of Cyanocobalamin 1071mcg given by Agnes Lawrence. Patient tolerated injection well.

## 2022-11-29 ENCOUNTER — Other Ambulatory Visit: Payer: Self-pay

## 2022-11-29 ENCOUNTER — Encounter: Payer: Self-pay | Admitting: Adult Health

## 2022-11-29 ENCOUNTER — Telehealth: Payer: Self-pay | Admitting: *Deleted

## 2022-11-29 ENCOUNTER — Inpatient Hospital Stay: Payer: 59 | Attending: Adult Health | Admitting: Adult Health

## 2022-11-29 VITALS — BP 122/93 | HR 106 | Resp 18 | Ht 65.0 in | Wt 115.2 lb

## 2022-11-29 DIAGNOSIS — Z9221 Personal history of antineoplastic chemotherapy: Secondary | ICD-10-CM | POA: Insufficient documentation

## 2022-11-29 DIAGNOSIS — F17210 Nicotine dependence, cigarettes, uncomplicated: Secondary | ICD-10-CM | POA: Insufficient documentation

## 2022-11-29 DIAGNOSIS — C50811 Malignant neoplasm of overlapping sites of right female breast: Secondary | ICD-10-CM | POA: Diagnosis not present

## 2022-11-29 DIAGNOSIS — Z1231 Encounter for screening mammogram for malignant neoplasm of breast: Secondary | ICD-10-CM | POA: Diagnosis not present

## 2022-11-29 DIAGNOSIS — Z170 Estrogen receptor positive status [ER+]: Secondary | ICD-10-CM

## 2022-11-29 DIAGNOSIS — Z853 Personal history of malignant neoplasm of breast: Secondary | ICD-10-CM | POA: Diagnosis not present

## 2022-11-29 DIAGNOSIS — Z9011 Acquired absence of right breast and nipple: Secondary | ICD-10-CM | POA: Diagnosis not present

## 2022-11-29 DIAGNOSIS — C50911 Malignant neoplasm of unspecified site of right female breast: Secondary | ICD-10-CM

## 2022-11-29 NOTE — Assessment & Plan Note (Signed)
[name] is a 77 year old woman with history of stage IA ER/PR positive breast cancer diagnosed in 2010 s/p right mastectomy, adjuvant chemotherapy, followed by aromatase inhibitor therapy x 5 years completed in June, 2016.   [name] has no clinical signs of breast cancer recurrence. I placed orders for her left breast screening mammogram to be completed at [facility] and I asked my nurse to fax them to [place].   [name] will continue to f/u with her PCP. We will see her back in 1 year for continued long term surveillance.

## 2022-11-29 NOTE — Telephone Encounter (Signed)
Faxed orders for Mammogram to [facility] Fax transmission confirmation received

## 2022-11-29 NOTE — Progress Notes (Signed)
[facility] Follow up:    [name], [name], MD [address] [place] [address]   DIAGNOSIS:  Cancer Staging  Breast cancer, right breast [facility]) Staging form: Breast, [name] 7th Edition - Clinical: Stage IA (T1c, N0, M0) - Signed by Chauncey Cruel, MD on 02/17/2015   SUMMARY OF ONCOLOGIC HISTORY: Per Dr. Jana Hakim note from 06/21/2017:  [name] woman: history of right breast cancer: pT1c pN0, stage IA, estrogen and progesterone receptor positive.   1. S/p right mastectomy with myocutaneous flap 09/19/2009   2. Adjuvant chemo with 4 cycles of  Taxotere/ Cytoxan   3. Femara, followed by Arimidex, started roughly Jan 2011, with a few months' interruption   4.  Completed 5 years of antiestrogen therapy June 2016   5.  Also s/p left breast lumpectomy in may 2014 ,negative for atypia or malignancy   6. Intermittent thrombocytosis, mild likely multifocal factorial (borderline iron deficiency, positive ANA and elevated C-reactive protein)  CURRENT THERAPY: observation  INTERVAL HISTORY: [name] 77 y.o. female returns for f/u of her history of breast cancer. She continues to smoke cigarettes and is not yet ready to quit.  She denies any breast changes.  She is overdue for her left breast screening mammogram.   [name] continues to see her PCP regularly and has struggled with her back issues this year and underwent spinal fusion surgery last month. She continues to see pulmonology for her COPD.    Patient Active Problem List   Diagnosis Date Noted   Spondylolisthesis of lumbar region 10/22/2022   Chronic back pain 82/50/0370   Systolic and diastolic CHF, chronic ([place]) 11/04/2021   PVD (peripheral vascular disease) ([place]) 11/04/2021   OSA on CPAP 11/04/2021   Chronic diarrhea 11/04/2021   GERD (gastroesophageal reflux disease) 11/04/2021   CAP (community acquired pneumonia) 08/01/2021   Sepsis due to pneumonia ([place]) 48/88/9169   Systolic CHF,  chronic ([place]) 07/31/2021   Prediabetes 07/19/2021   Hemoptysis 10/05/2020   Hypokalemia 09/26/2020   OSA (obstructive sleep apnea) 09/11/2020   SOB (shortness of breath)    Acute on chronic systolic CHF (congestive heart failure) ([place])    Acute respiratory failure with hypoxia ([place]) 08/14/2020   Esophageal dysmotility 08/14/2020   Atherosclerosis of aorta ([place]) 08/14/2020   Snoring 06/26/2020   Shortness of breath    Acute systolic CHF (congestive heart failure) ([place]) 05/21/2020   Major depressive disorder, single episode, moderate degree ([place]) 05/21/2020   COPD exacerbation ([place]) 05/20/2020   Acute on chronic respiratory failure with hypoxia ([place]) 05/20/2020   Genetic testing 12/12/2018   Family history of breast cancer    Family history of ovarian cancer    Family history of pancreatic cancer    Family history of colon cancer    Family history of melanoma    History of breast cancer    Chronic respiratory failure with hypoxia ([place]) 10/14/2017   COPD with acute exacerbation ([place]) 10/10/2017   Flu vaccine need 09/09/2017   Anxiety disorder 09/09/2017   Fibromyalgia 09/09/2017   Thrombocytosis 06/21/2017   Malignant neoplasm of overlapping sites of right breast in female, estrogen receptor positive ([place]) 06/05/2017   Dermatitis 04/02/2017   Edema of right lower extremity 02/26/2017   Positive ANA (antinuclear antibody) 02/08/2017   Elevated IgE level and eosinophilia 12/31/2016   Oral thrush 11/28/2016   Rash and nonspecific skin eruption 11/28/2016   Allergic rhinitis 11/28/2016   History of bacterial pneumonia 08/09/2016  Insomnia 03/04/2015   Breast cancer, right breast ([place]) 02/17/2015   CAD (coronary artery disease) 07/11/2011   Tobacco abuse 03/27/2011   Chronic mesenteric ischemia ([place]) 03/27/2011   Solitary pulmonary nodule 03/27/2011   PAD (peripheral artery disease) ([place]) 03/04/2011   Arthropathy 11/09/2010   Hypothyroidism 09/20/2009   Hyperlipidemia  09/20/2009   Hearing loss 09/14/2008   Essential hypertension 09/08/2007   COPD, frequent exacerbations ([place]) 09/08/2007   GERD 09/08/2007    is allergic to sinequan [doxepin], sulfa antibiotics, and sulfur.  MEDICAL HISTORY: Past Medical History:  Diagnosis Date   Bilateral leg cramps    Bladder neoplasm    Cancer (HCC)    CHF (congestive heart failure) (HCC)    Chronic deep vein thrombosis (DVT) of right lower extremity ([place])    05/ 2018  chronic non-occlusive DVT RLE   Chronic kidney disease    COPD, frequent exacerbations ([place])    03-06-2018 per pt last exacerbation 12/ 2018   Coronary artery disease    cardiologist-  dr Aundra Dubin-- 03-11-2018 er cath , 80% stenosis in the ostial second diagonal   Dyspnea on minimal exertion    Emphysema/COPD ([place])    CAT score- 17   Family history of breast cancer    Family history of colon cancer    Family history of melanoma    Family history of ovarian cancer    Family history of pancreatic cancer    Fibromyalgia 1995   GERD (gastroesophageal reflux disease)    Hiatal hernia    History of breast cancer    History of diverticulitis of colon    2002  s/p  sigmoid colectomy   History of multiple pulmonary nodules    hx RUL nodules x2  s/p right VATS w/ wedge resection 09-11-2005 and 06-14-2011  both  necrotizing granulomatous inflammation w/ cystic area of necrosis & focal calcification   History of right breast cancer oncologist-  dr Jana Hakim-- no recurrence   dx 2010--  IDC, Stage IA , ER/PR+,  (pT1c pN0) 11-09-2008 right lumpectomy;  12-18-2008 right simple mastectomy for DCIS margins;  completed chemotherapy 2010 (no radiation) and completed antiestrogen therapy   Hx of colonic polyps    Dr. 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   Nocturia    OA (osteoarthritis)    thumbs   On supplemental oxygen therapy    03-06-2018  per pt uses only at night,  checks O2 sats at home,  stated am sat 89% after moving around average 93-94% with RA   Osteoporosis    Peripheral arterial occlusive disease ([place]) vascular-- dr chen/ dr Donzetta Matters   proximal right SFA severe focal stenosis with collaterals from the PFA/  04/ 2012 occluded celiac and SMA arteries with distal reconstitution w/ patent IMA   Peripheral vascular disease ([place])    chronic DVT RLE,  mesenteric vascular disease   Pneumonia    Right lower lobe pulmonary nodule    Chest CT 08-29-2017   Sleep apnea    Stage 3 severe COPD by GOLD classification ([place]) hx frequent exacerbations--   pulmologist-  dr Maryann Alar--  per lov note , dated 12-20-2017, oxyogen 2L is prescribed for use with exertion (but pt only uses mostly at night), O2 sats on RA run the 80s , this day sat 86% RA and with 2L O2 sat 92%   Varicose  veins of leg with swelling    varicose vein surgery - Dr. Aleda Grana   Wears glasses    Wears hearing aid in both ears     SURGICAL HISTORY: Past Surgical History:  Procedure Laterality Date   ABDOMINAL AORTOGRAM N/A 07/11/2020   Procedure: ABDOMINAL AORTOGRAM;  Surgeon: Waynetta Sandy, MD;  Location: [facility];  Service: Cardiovascular;  Laterality: N/A;   AORTIC ARCH ANGIOGRAPHY N/A 07/11/2020   Procedure: AORTIC ARCH ANGIOGRAPHY;  Surgeon: Waynetta Sandy, MD;  Location: [facility];  Service: Cardiovascular;  Laterality: N/A;   BREAST EXCISIONAL BIOPSY Left 10/15/2008   BREAST LUMPECTOMY W/ NEEDLE LOCALIZATION Right 11-09-2008    dr cornett   [place]  03-12-2011   dr Aundra Dubin   70-80% ostial stenosis in small second diagonal (appears to small for intervention),  dLAD 40-50%,  minimal luminal irregulartieis involoving LCFx and RCA,  LVEF 55%   CATARACT EXTRACTION W/ INTRAOCULAR LENS  IMPLANT, BILATERAL  2011   CYSTOSCOPY N/A 03/11/2018   Procedure: CYSTOSCOPY WITH INSTILLATION OF POST OPERATIVE  EPIRUBICIN;  Surgeon: Festus Aloe, MD;  Location: [facility];  Service: Urology;  Laterality: N/A;   LEFT HEART CATH AND CORONARY ANGIOGRAPHY N/A 05/25/2020   Procedure: LEFT HEART CATH AND CORONARY ANGIOGRAPHY;  Surgeon: Troy Sine, MD;  Location: [facility];  Service: Cardiovascular;  Laterality: N/A;   MASTECTOMY Right    PARTIAL COLECTOMY  2002   sigmoid and appectomy (diverticulitis)   PARTIAL MASTECTOMY WITH NEEDLE LOCALIZATION Left 02/23/2013   Procedure:  LEFT PARTIAL MASTECTOMY WITH NEEDLE LOCALIZATION;  Surgeon: Adin Hector, MD;  Location: [facility];  Service: General;  Laterality: Left;   PERIPHERAL VASCULAR INTERVENTION  07/11/2020   Procedure: PERIPHERAL VASCULAR INTERVENTION;  Surgeon: Waynetta Sandy, MD;  Location: [facility];  Service: Cardiovascular;;   RECONSTRUCTION BREAST W/ LATISSIMUS DORSI FLAP Right 05-30-2009   dr Harlow Mares  [facility]   REDUCTION MAMMAPLASTY Left    SIMPLE MASTECTOMY Right 12-28-2008    dr Brantley Stage  [facility]   TRANSFORAMINAL LUMBAR INTERBODY FUSION W/ MIS 1 LEVEL Left 10/22/2022   Procedure: LUMBAR THREE-FOUR MINIMALLY INVASIVE TRANSFORAMINAL LUMBAR INTERBODY FUSION;  Surgeon: Judith Part, MD;  Location: [facility] [place];  Service: Neurosurgery;  Laterality: Left;  3C/RM 21 to follow   TRANSTHORACIC ECHOCARDIOGRAM  07-09-2016  dr Aundra Dubin   ef 55-60%, grade 1 diastoic dysfunction/  mild TR   TRANSURETHRAL RESECTION OF BLADDER TUMOR N/A 03/11/2018   Procedure: TRANSURETHRAL RESECTION OF BLADDER TUMOR (TURBT) 2-5cm;  Surgeon: Festus Aloe, MD;  [facility];  Service: Urology;  Laterality: N/A;   UPPER EXTREMITY ANGIOGRAPHY Left 07/11/2020   Procedure: Upper Extremity Angiography;  Surgeon: Waynetta Sandy, MD;  Location: [facility];  Service: Cardiovascular;  Laterality: Left;   VAGINAL HYSTERECTOMY  1973   VIDEO ASSISTED THORACOSCOPY (VATS)/WEDGE RESECTION Right 09-11-2005  &  06-14-2011   dr  Fara Boros  [facility]   both RUL    SOCIAL HISTORY: Social History   Socioeconomic History   Marital status: Married    Spouse name: Not on file   Number of children: 3   Years of education: 12   Highest education level: Not on file  Occupational History   Occupation: Chiropractor: RETIRED    Comment: retired  Tobacco Use   Smoking status: Some Days    Packs/day: 0.25    Years: 50.00  Total pack years: 12.50    Types: Cigarettes    Last attempt to quit: 05/18/2016    Years since quitting: 6.5   Smokeless tobacco: Never  Vaping Use   Vaping Use: Never used  Substance and Sexual Activity   Alcohol use: Yes    Alcohol/week: 14.0 standard drinks of alcohol    Types: 14 Cans of beer per week    Comment: every night   Drug use: No   Sexual activity: Not on file  Other Topics Concern   Not on file  Social History Narrative   HSG, beauty school. Married '69 -59 yrs/widowed; married '79 - 108yr/divorced; married '87.  2 sons - '70, '74, 1 srtep-son; 1 grandchild. 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 SO - good health.  NO history of abuse.  ACP - full code and all heroic measures.    Social Determinants of Health   Financial Resource Strain: Low Risk  (12/21/2020)   Overall Financial Resource Strain (CARDIA)    Difficulty of Paying Living Expenses: Not hard at all  Food Insecurity: No Food Insecurity (12/21/2020)   Hunger Vital Sign    Worried About Running Out of Food in the Last Year: Never true    Ran Out of Food in the Last Year: Never true  Transportation Needs: No Transportation Needs (12/21/2020)   PRAPARE - Hydrologist (Medical): No    Lack of Transportation (Non-Medical): No  Physical Activity: Inactive (12/21/2020)   Exercise Vital Sign    Days of Exercise per Week: 0 days    Minutes of Exercise per Session: 0 min  Stress: No Stress Concern Present (12/21/2020)   [place]     Feeling of Stress : Not at all  Social Connections: Socially Isolated (12/21/2020)   Social Connection and Isolation Panel [NHANES]    Frequency of Communication with Friends and Family: Once a week    Frequency of Social Gatherings with Friends and Family: Never    Attends Religious Services: Never    Marine scientist or Organizations: No    Attends Archivist Meetings: Never    Marital Status: Married  Human resources officer Violence: Not At Risk (12/21/2020)   Humiliation, Afraid, Rape, and Kick questionnaire    Fear of Current or Ex-Partner: No    Emotionally Abused: No    Physically Abused: No    Sexually Abused: No    FAMILY HISTORY: Family History  Problem Relation Age of Onset   Colon cancer Mother        colon   COPD Mother        brown lung   Hypertension Mother    Diabetes Mother    Emphysema Mother    Coronary artery disease Father    Heart attack Father    Sudden death Father    Heart disease Father    Cancer Sister        throat   Hypertension Sister    Coronary artery disease Brother    Heart attack Brother        early 22s   Throat cancer Sister    Ovarian cancer Sister        fallopian tube cancer in her 60s   Melanoma Sister    Hypertension Sister    Pancreatic cancer Sister    Melanoma Niece        dx in her 62s   Breast cancer Niece  45    Review of Systems  Constitutional:  Negative for appetite change, chills, fatigue, fever and unexpected weight change.  HENT:   Negative for hearing loss, lump/mass and trouble swallowing.   Eyes:  Negative for eye problems and icterus.  Respiratory:  Positive for cough (chronic) and shortness of breath (chronic). Negative for chest tightness.   Cardiovascular:  Negative for chest pain, leg swelling and palpitations.  Gastrointestinal:  Negative for abdominal distention, abdominal pain, constipation, diarrhea, nausea and vomiting.  Endocrine: Negative for hot flashes.  Genitourinary:  Negative for  difficulty urinating.   Musculoskeletal:  Positive for back pain (chronic). Negative for arthralgias.  Skin:  Negative for itching and rash.  Neurological:  Negative for dizziness, extremity weakness, headaches and numbness.  Hematological:  Negative for adenopathy. Does not bruise/bleed easily.  Psychiatric/Behavioral:  Negative for depression. The patient is not nervous/anxious.     PHYSICAL EXAMINATION  ECOG PERFORMANCE STATUS: 1 - Symptomatic but completely ambulatory  Vitals:   11/29/22 1340  BP: (!) 122/93  Pulse: (!) 106  Resp: 18  SpO2: 98%  GENERAL: Patient is a well appearing female in no acute distress HEENT:  Sclerae anicteric.  Oropharynx clear and moist. No ulcerations or evidence of oropharyngeal candidiasis. Neck is supple.  NODES:  No cervical, supraclavicular, or axillary lymphadenopathy palpated.  BREAST EXAM:  left breast is benign.  Right breast s/p mastectomy and reconstruction, no sign of local recurrence. LUNGS:  Clear to auscultation bilaterally.  No wheezes or rhonchi. HEART:  Regular rate and rhythm. No murmur appreciated. ABDOMEN:  Soft, nontender.  Positive, normoactive bowel sounds. No organomegaly palpated. MSK:  No focal spinal tenderness to palpation. Full range of motion bilaterally in the upper extremities. EXTREMITIES:  No peripheral edema.   SKIN:  Clear with no obvious rashes or skin changes. No nail dyscrasia. NEURO:  Nonfocal. Well oriented.  Appropriate affect.   LABORATORY DATA: None for this visit    ASSESSMENT and THERAPY PLAN:   Breast cancer, right breast ([place]) [name] is a 77 year old woman with history of stage IA ER/PR positive breast cancer diagnosed in 2010 s/p right mastectomy, adjuvant chemotherapy, followed by aromatase inhibitor therapy x 5 years completed in June, 2016.   [name] has no clinical signs of breast cancer recurrence. I placed orders for her left breast screening mammogram to be completed at [facility] and I asked my  nurse to fax them to [facility].   [name] will continue to f/u with her PCP. We will see her back in 1 year for continued long term surveillance.     All questions were answered. The patient knows to call the clinic with any problems, questions or concerns. We can certainly see the patient much sooner if necessary.  Total encounter time:20 minutes*in face-to-face visit time, chart review, lab review, care coordination, order entry, and documentation of the encounter time.    Wilber Bihari, NP 11/29/22 10:24 PM Medical Oncology and Hematology [facility] [place], [place] [address] Tel. [phone]    Fax. [phone]  *Total Encounter Time as defined by the Centers for Medicare and Medicaid Services includes, in addition to the face-to-face time of a patient visit (documented in the note above) non-face-to-face time: obtaining and reviewing outside history, ordering and reviewing medications, tests or procedures, care coordination (communications with other health care professionals or caregivers) and documentation in the medical record.

## 2022-11-30 ENCOUNTER — Ambulatory Visit: Payer: 59

## 2022-12-20 DIAGNOSIS — J9601 Acute respiratory failure with hypoxia: Secondary | ICD-10-CM | POA: Diagnosis not present

## 2022-12-20 DIAGNOSIS — J449 Chronic obstructive pulmonary disease, unspecified: Secondary | ICD-10-CM | POA: Diagnosis not present

## 2022-12-20 DIAGNOSIS — R0602 Shortness of breath: Secondary | ICD-10-CM | POA: Diagnosis not present

## 2022-12-21 DIAGNOSIS — J449 Chronic obstructive pulmonary disease, unspecified: Secondary | ICD-10-CM | POA: Diagnosis not present

## 2022-12-23 NOTE — Progress Notes (Signed)
HPI F Smoker(50 pkyrs) followed for OSA, Insomnia, complicated by  sCHF, CAD, Chronic DVT R lower leg, Mesenteric Ischemia, HTN, PAD, COPD( GOLD III)/ Dr Marchelle Gearing,  Chronic Respiratory Failure with Hypoxia, GERD, Hemoptysis, Hypothyroid,  Hx R Breast Cancer,  NPSG  07/27/20- AHI 26.5/ hr, desaturation to 84%, requiring 1L O2 (Nocturnal Hypoxemia),   Walk Test 12/24/2022 room air-minimum saturation 95%, maximum heart rate 102  ===================================================================================== 10/05/20- 74 yoF former smoker for sleep evaluation courtesy of Dr Betty Swaziland for OSA., Insomnia,  Medical problem list includes  sCHF, CAD, Chronic DVT R lower leg, Mesenteric Ischemia, HTN, PAD, COPD( GOLD III)/ Dr Marchelle Gearing,  Chronic Respiratory Failure with Hypoxia, GERD, Hemoptysis,  Hypothyroid, Hx R Breast Cancer,  NPSG  07/27/20- AHI 26.5/ hr, desaturation to 84%, requiring 1L O2 (Nocturnal Hypoxemia), Body Weight 135 lbs                              Patient is hard of hearing. Husband is here.  O2 2L [employer] 2L sleep  Body weight today- 134 lbs Epworth score- 3 with CPAP Covid vax- 1 Phizer Flu vax- none Hosp 11/27-12/2 for COPD exacerbation  She learned DME would need long time to get her a CPAP machine so she has borrowed one from her nephew- thinks set on 10. Using full-face mask from sleep study. Not satisfied with mask, but really happy with CPAP- sleeping much better.  Has had O2 for sleep from [employer] x 5 years for chronic hypoxic respiratory failure x 5 years and is bleeding that into CPAP mask.  Daytime room air saturation each morning at home is around 94%.  NEW PROBLEM- Hemoptysis. New for her- streak heme in kleenex yesterday and again today. Breathing feels much improved since last hosp. CT and CXR from that admit did not show obvious reason for new bleed on my review. Plavix was added then and she continues BASA.   12/24/22- 77 yoF Smoker(50 pkyrs) followed for OSA,  Insomnia, complicated by  sCHF, CAD, Chronic DVT R lower leg, Mesenteric Ischemia, HTN, PAD, COPD( GOLD III)/ Dr Marchelle Gearing,  Chronic Respiratory Failure with Hypoxia, GERD, Hemoptysis, Hypothyroid,  Hx R Breast Cancer,  NPSG  07/27/20- AHI 26.5/ hr, desaturation to 84%, requiring 1L O2 (Nocturnal Hypoxemia),                                Patient is hard of hearing. Husband is here.  O2 4L [employer] 2L sleep  CPAP bought directly from [facility] -Trelegy 200, Neb Duoneb, Ventolin hfa, Neb Pulmicort, Dupixent, Lorazepam, Trazodone Body weight today- 118 lbs   135 lbs at initial NPSG Covid vax- 1 Phizer Flu vax-  -----Hasn't used cpap machine consistently. Was having issues previously with mask  Still using a borrowed machine. Stays on home oxygen now at 4 L with activity, 2 L for sleep. Complains of weakness dizziness and shortness of breath on short walks.  Arrival saturation was 95%.  Cough productive white.  We discussed indications for oxygen and considerations other than lung disease for dyspnea. We are going to reassess/update sleep study with HST. Walk Test 12/24/2022 room air-minimum saturation 95%, maximum heart rate 102  ROS-see HPI   + = positive Constitutional:    +weight loss, night sweats, fevers, chills, fatigue, lassitude. HEENT:    headaches, difficulty swallowing, tooth/dental problems, sore throat,  Sneezing,+ itching, ear ache, nasal congestion, post nasal drip, snoring CV:    chest pain, orthopnea, PND, swelling in lower extremities, anasarca,                                   dizziness, palpitations Resp:   +shortness of breath with exertion or at rest.                +productive cough,   non-productive cough, +coughing up of blood.              change in color of mucus.  wheezing.   Skin:    +rash or lesions. GI:  + heartburn, +indigestion, abdominal pain, nausea, vomiting, diarrhea,                 change in bowel habits, loss of appetite GU: dysuria, change in color of  urine, no urgency or frequency.   flank pain. MS:   joint pain, stiffness, decreased range of motion, back pain. Neuro-     nothing unusual Psych:  change in mood or affect.  +depression or anxiety.   memory loss.  OBJ- Physical Exam General- Alert, Oriented, Affect-appropriate, Distress- none acute, + frail, + wheelchair Skin- rash-none, lesions- none, excoriation- none Lymphadenopathy- none Head- atraumatic            Eyes- Gross vision intact, PERRLA, conjunctivae and secretions clear            Ears- Hearing, canals-normal            Nose- Clear, no-Septal dev, mucus, polyps, erosion, perforation             Throat- Mallampati II , mucosa clear , drainage- none, tonsils- atrophic Neck- flexible , trachea midline, no stridor , thyroid nl, carotid no bruit Chest - symmetrical excursion , unlabored           Heart/CV- RRR , no murmur , no gallop  , no rub, nl s1 s2                           - JVD- none , edema- none, stasis changes- none, varices- none           Lung- + decreased, no rhonchi, wheeze- none, cough+ dry, dullness-none, rub- none           Chest wall-  Abd-  Br/ Gen/ Rectal- Not done, not indicated Extrem- cyanosis- none, clubbing, none, atrophy- none, strength- nl Neuro- grossly intact to observation

## 2022-12-24 ENCOUNTER — Ambulatory Visit (INDEPENDENT_AMBULATORY_CARE_PROVIDER_SITE_OTHER): Payer: 59 | Admitting: Internal Medicine

## 2022-12-24 ENCOUNTER — Encounter: Payer: Self-pay | Admitting: Internal Medicine

## 2022-12-24 VITALS — BP 110/62 | HR 85 | Ht 65.0 in | Wt 118.2 lb

## 2022-12-24 DIAGNOSIS — G4733 Obstructive sleep apnea (adult) (pediatric): Secondary | ICD-10-CM

## 2022-12-24 DIAGNOSIS — J9611 Chronic respiratory failure with hypoxia: Secondary | ICD-10-CM

## 2022-12-24 DIAGNOSIS — J441 Chronic obstructive pulmonary disease with (acute) exacerbation: Secondary | ICD-10-CM | POA: Diagnosis not present

## 2022-12-24 NOTE — Patient Instructions (Signed)
Order- Walk test on room air dx COPD mixed type  Order- Schedule home sleep test   dx OSA

## 2022-12-24 NOTE — Progress Notes (Signed)
HPI: [name] is a 77 y.o. female with PMHx significant for CHF,severe COPD,insomnia,anxiety,depression, CAD,and PAD who is here today for her routine physical.  Last CPE: Has not had one in years. AWV 12/2021 Last follow up on 06/29/22.  She does not exercise regularly due to chronic pain. In general she follows a healthful diet.  Immunization History  Administered Date(s) Administered   Fluad Quad(high Dose 65+) 08/26/2019, 07/19/2021, 07/30/2022   Influenza, High Dose Seasonal PF 10/03/2017, 06/20/2018   Influenza,inj,Quad PF,6+ Mos 08/10/2014, 08/12/2015, 07/03/2016   PFIZER(Purple Top)SARS-COV-2 Vaccination 01/06/2020   Pneumococcal Conjugate-13 08/10/2014   Pneumococcal Polysaccharide-23 08/12/2015   Td 10/16/2007   Zoster, Live 11/09/2010   Health Maintenance  Topic Date Due   Hepatitis C Screening  Never done   Medicare Annual Wellness (AWV)  01/09/2023   COVID-19 Vaccine (2 - Pfizer risk series) 01/09/2023 (Originally 01/27/2020)   Zoster Vaccines- Shingrix (1 of 2) 10/18/2023 (Originally 11/14/1964)   Pneumonia Vaccine 70+ Years old  Completed   INFLUENZA VACCINE  Completed   DEXA SCAN  Completed   HPV VACCINES  Aged Out   Lung Cancer Screening  Discontinued   DTaP/Tdap/Td  Discontinued   COLONOSCOPY (Pts 45-110yrs Insurance coverage will need to be confirmed)  Discontinued   Hypothyroidism:On Levothyroxine 112 mcg daily.Marland Kitchen TSH 1.3 in 06/2022.  Right breast cancer: Follow-up with oncologist regularly. Bladder cancer and urinary frequency, follows with urologist.  Essential thrombocythemia:Intermittent elevated platelets,300's-400's, in 2022 619.  Lab Results  Component Value Date   WBC 7.2 10/18/2022   HGB 15.5 (H) 10/18/2022   HCT 46.3 (H) 10/18/2022   MCV 96.5 10/18/2022   PLT 430 (H) 10/18/2022   HTN on non pharmacologic treatment.  CAD,PAD,and HFrEF: Follows with cardiologist, last visit 08/09/22. She is on Atorvastatin 80 mg daily,Plavix 75 mg  daily,Pletal 100 mg bid,Aspirin 81 mg daily,and zetia 10 mg daily. Echo on 08/02/22 LVEF55-60% and grade II diastolic dysfunction.  Lab Results  Component Value Date   CHOL 145 12/26/2021   HDL 93 12/26/2021   LDLCALC 40 12/26/2021   LDLDIRECT 88.0 08/18/2013   TRIG 57 12/26/2021   CHOLHDL 1.6 12/26/2021   Lab Results  Component Value Date   ALT 21 11/07/2021   AST 15 11/07/2021   ALKPHOS 66 11/07/2021   BILITOT 0.6 11/07/2021   She reports having lumbar spine surgery, minimally invasive transforaminal lumbar interbody fusion on 10/22/22, and is still experiencing pain, following up with a neurosurgeon.  She underwent physical therapy for five weeks after surgery and has been doing some walking around the house.  She experiences pain when getting up and down and has difficulty sleeping due to pain, averaging about eight hours per night.  For insomnia she is on Trazodone 100 mg and Lorazepam 2 mg at bedtime. She is also waking up multiple times to use the bathroom, hx of overactive bladder, follows with urologist.  Depression and anxiety on Paroxetine 20 mg daily.    12/25/2022    1:57 PM 06/29/2022    1:59 PM 01/29/2022   12:31 PM 01/08/2022    2:10 PM 12/20/2021    2:15 PM  Depression screen PHQ 2/9  Decreased Interest 0 0 0 0 0  Down, Depressed, Hopeless 0 0 0 0 0  PHQ - 2 Score 0 0 0 0 0  Altered sleeping  3 0  3  Tired, decreased energy  3 1  1   Change in appetite  3 1  1   Feeling  bad or failure about yourself   3 0  0  Trouble concentrating  0 0  0  Moving slowly or fidgety/restless  0 0  3  Suicidal thoughts  0 0  0  PHQ-9 Score  12 2  8   Difficult doing work/chores  Not difficult at all Not difficult at all  Somewhat difficult   Severe COPD and OSA, and recently saw Dr. Annamaria Boots for these issues.  She experiences shortness of breath with exertion but has improved after COPD treatment was adjusted.  She reports a reduced appetite and has been losing weight, despite  trying to eat healthily. Her weight has decreased from 126 pounds in September to 118 pounds. Her potassium level was low at 3.1 before surgery, and she is currently taking potassium supplements and a furosemide 40 mg bid.  GERD: She experiences acid reflux and takes OTC antacids.  Negative for abdominal pain,melena,or nausea. She is on Protonix 20 mg daily.  Review of Systems  Constitutional:  Positive for fatigue. Negative for appetite change, chills and fever.  HENT:  Positive for hearing loss (with hearing aids.). Negative for dental problem, mouth sores, sore throat and trouble swallowing.   Eyes:  Negative for redness and visual disturbance.  Respiratory:  Positive for shortness of breath. Negative for cough and wheezing.   Cardiovascular:  Negative for chest pain and leg swelling.  Gastrointestinal:  Negative for blood in stool and vomiting.       No changes in bowel habits.  Endocrine: Negative for cold intolerance, heat intolerance, polydipsia, polyphagia and polyuria.  Genitourinary:  Negative for decreased urine volume, dysuria, hematuria and vaginal bleeding.  Musculoskeletal:  Positive for arthralgias, back pain and gait problem.  Skin:  Negative for color change and rash.  Allergic/Immunologic: Positive for environmental allergies.  Neurological:  Negative for syncope, weakness and headaches.  Psychiatric/Behavioral:  Positive for sleep disturbance. Negative for confusion. The patient is nervous/anxious.   All other systems reviewed and are negative.  Current Outpatient Medications on File Prior to Visit  Medication Sig Dispense Refill   albuterol (VENTOLIN HFA) 108 (90 Base) MCG/ACT inhaler TAKE 2 PUFFS BY MOUTH EVERY 6 HOURS AS NEEDED FOR WHEEZE OR SHORTNESS OF BREATH 8.5 each 3   aspirin EC 81 MG tablet Take 1 tablet (81 mg total) by mouth daily. 30 tablet    atorvastatin (LIPITOR) 80 MG tablet Take 1 tablet (80 mg total) by mouth daily. 90 tablet 3   budesonide  (PULMICORT) 0.5 MG/2ML nebulizer solution Take 2 mLs (0.5 mg total) by nebulization 2 (two) times daily. 20 mL 12   cilostazol (PLETAL) 100 MG tablet Take 1 tablet (100 mg total) by mouth 2 (two) times daily. 180 tablet 3   clopidogrel (PLAVIX) 75 MG tablet Take 1 tablet (75 mg total) by mouth daily. 90 tablet 3   cyanocobalamin (VITAMIN B12) 1000 MCG/ML injection Inject 1,000 mcg into the muscle every 30 (thirty) days.     cyclobenzaprine (FLEXERIL) 10 MG tablet Take 1 tablet (10 mg total) by mouth 3 (three) times daily as needed for muscle spasms. 90 tablet 0   DUPIXENT 300 MG/2ML SOPN Inject 300 mg into the skin every 14 (fourteen) days.     ezetimibe (ZETIA) 10 MG tablet Take 1 tablet (10 mg total) by mouth daily. 90 tablet 1   ferrous sulfate 325 (65 FE) MG tablet TAKE 1 TABLET BY MOUTH EVERY DAY WITH BREAKFAST 90 tablet 1   fluticasone (FLONASE) 50 MCG/ACT nasal spray  USE 1 SPRAY INTO EACH NOSTRIL TWICE A DAY 48 mL 1   Fluticasone-Umeclidin-Vilant (TRELEGY ELLIPTA) 200-62.5-25 MCG/ACT AEPB TAKE 1 PUFF BY MOUTH EVERY DAY 60 each 12   furosemide (LASIX) 40 MG tablet TAKE 1 TABLET BY MOUTH TWICE  DAILY 200 tablet 2   gabapentin (NEURONTIN) 300 MG capsule Take 300 mg by mouth 4 (four) times daily as needed (for nerve pain).     ipratropium-albuterol (DUONEB) 0.5-2.5 (3) MG/3ML SOLN Inhale 3 mLs into the lungs as directed. TID for 1 week and then use as needed Q6 hours. 360 mL 4   levothyroxine (SYNTHROID) 112 MCG tablet TAKE 1 TABLET BY MOUTH EVERY DAY IN THE MORNING BEFORE BREAKFAST 90 tablet 2   LORazepam (ATIVAN) 2 MG tablet TAKE ONE TABLET BY MOUTH DAILY AT 9PM AT BEDTIME 30 tablet 3   montelukast (SINGULAIR) 10 MG tablet TAKE 1 TABLET BY MOUTH EVERYDAY AT BEDTIME 90 tablet 1   pantoprazole (PROTONIX) 20 MG tablet TAKE 1 TABLET BY MOUTH AT  BEDTIME 100 tablet 2   pantoprazole (PROTONIX) 40 MG tablet Take 40 mg by mouth daily.     PARoxetine (PAXIL) 20 MG tablet TAKE 1 TABLET BY MOUTH EVERY  DAY 90 tablet 2   potassium chloride SA (KLOR-CON M) 20 MEQ tablet TAKE 1 TABLET BY MOUTH DAILY 100 tablet 2   tamsulosin (FLOMAX) 0.4 MG CAPS capsule Take 1 capsule (0.4 mg total) by mouth daily. 90 capsule 0   traZODone (DESYREL) 100 MG tablet TAKE 1 TABLET BY MOUTH EVERYDAY AT BEDTIME 90 tablet 1   oxyCODONE 10 MG TABS Take 1 tablet (10 mg total) by mouth every 4 (four) hours as needed for severe pain ((score 7 to 10)). (Patient not taking: Reported on 12/25/2022) 30 tablet 0   No current facility-administered medications on file prior to visit.   Past Medical History:  Diagnosis Date   Bilateral leg cramps    Bladder neoplasm    Cancer (HCC)    CHF (congestive heart failure) (HCC)    Chronic deep vein thrombosis (DVT) of right lower extremity ([place])    05/ 2018  chronic non-occlusive DVT RLE   Chronic kidney disease    COPD, frequent exacerbations ([place])    03-06-2018 per pt last exacerbation 12/ 2018   Coronary artery disease    cardiologist-  dr Aundra Dubin-- 03-11-2018 er cath , 80% stenosis in the ostial second diagonal   Dyspnea on minimal exertion    Emphysema/COPD ([place])    CAT score- 17   Family history of breast cancer    Family history of colon cancer    Family history of melanoma    Family history of ovarian cancer    Family history of pancreatic cancer    Fibromyalgia 1995   GERD (gastroesophageal reflux disease)    Hiatal hernia    History of breast cancer    History of diverticulitis of colon    2002  s/p  sigmoid colectomy   History of multiple pulmonary nodules    hx RUL nodules x2  s/p right VATS w/ wedge resection 09-11-2005 and 06-14-2011  both  necrotizing granulomatous inflammation w/ cystic area of necrosis & focal calcification   History of right breast cancer oncologist-  dr Jana Hakim-- no recurrence   dx 2010--  IDC, Stage IA , ER/PR+,  (pT1c pN0) 11-09-2008 right lumpectomy;  12-18-2008 right simple mastectomy for DCIS margins;  completed chemotherapy 2010  (no radiation) and completed antiestrogen therapy   Hx of  colonic polyps    Dr. 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   Nocturia    OA (osteoarthritis)    thumbs   On supplemental oxygen therapy    03-06-2018 per pt uses only at night,  checks O2 sats at home,  stated am sat 89% after moving around average 93-94% with RA   Osteoporosis    Peripheral arterial occlusive disease ([place]) vascular-- dr chen/ dr Donzetta Matters   proximal right SFA severe focal stenosis with collaterals from the PFA/  04/ 2012 occluded celiac and SMA arteries with distal reconstitution w/ patent IMA   Peripheral vascular disease ([place])    chronic DVT RLE,  mesenteric vascular disease   Pneumonia    Right lower lobe pulmonary nodule    Chest CT 08-29-2017   Sleep apnea    Stage 3 severe COPD by GOLD classification ([place]) hx frequent exacerbations--   pulmologist-  dr Maryann Alar--  per lov note , dated 12-20-2017, oxyogen 2L is prescribed for use with exertion (but pt only uses mostly at night), O2 sats on RA run the 80s , this day sat 86% RA and with 2L O2 sat 92%   Varicose veins of leg with swelling    varicose vein surgery - Dr. Aleda Grana   Wears glasses    Wears hearing aid in both ears    Past Surgical History:  Procedure Laterality Date   ABDOMINAL AORTOGRAM N/A 07/11/2020   Procedure: ABDOMINAL AORTOGRAM;  Surgeon: Waynetta Sandy, MD;  Location: [place] [facility];  Service: Cardiovascular;  Laterality: N/A;   AORTIC ARCH ANGIOGRAPHY N/A 07/11/2020   Procedure: AORTIC ARCH ANGIOGRAPHY;  Surgeon: Waynetta Sandy, MD;  Location: [place] [facility];  Service: Cardiovascular;  Laterality: N/A;   BREAST EXCISIONAL BIOPSY Left 10/15/2008   BREAST LUMPECTOMY W/ NEEDLE LOCALIZATION Right 11-09-2008    dr cornett   [place]  03-12-2011   dr Aundra Dubin   70-80% ostial  stenosis in small second diagonal (appears to small for intervention),  dLAD 40-50%,  minimal luminal irregulartieis involoving LCFx and RCA,  LVEF 55%   CATARACT EXTRACTION W/ INTRAOCULAR LENS  IMPLANT, BILATERAL  2011   CYSTOSCOPY N/A 03/11/2018   Procedure: CYSTOSCOPY WITH INSTILLATION OF POST OPERATIVE EPIRUBICIN;  Surgeon: Festus Aloe, MD;  Location: [facility];  Service: Urology;  Laterality: N/A;   LEFT HEART CATH AND CORONARY ANGIOGRAPHY N/A 05/25/2020   Procedure: LEFT HEART CATH AND CORONARY ANGIOGRAPHY;  Surgeon: Troy Sine, MD;  Location: [place] [facility];  Service: Cardiovascular;  Laterality: N/A;   MASTECTOMY Right    PARTIAL COLECTOMY  2002   sigmoid and appectomy (diverticulitis)   PARTIAL MASTECTOMY WITH NEEDLE LOCALIZATION Left 02/23/2013   Procedure:  LEFT PARTIAL MASTECTOMY WITH NEEDLE LOCALIZATION;  Surgeon: Adin Hector, MD;  Location: [place];  Service: General;  Laterality: Left;   PERIPHERAL VASCULAR INTERVENTION  07/11/2020   Procedure: PERIPHERAL VASCULAR INTERVENTION;  Surgeon: Waynetta Sandy, MD;  Location: [place] [facility];  Service: Cardiovascular;;   RECONSTRUCTION BREAST W/ LATISSIMUS DORSI FLAP Right 05-30-2009   dr Harlow Mares  [facility]   REDUCTION MAMMAPLASTY Left    SIMPLE MASTECTOMY Right 12-28-2008    dr Brantley Stage  [facility]   TRANSFORAMINAL LUMBAR INTERBODY FUSION W/ MIS 1 LEVEL Left 10/22/2022   Procedure: LUMBAR THREE-FOUR MINIMALLY  INVASIVE TRANSFORAMINAL LUMBAR INTERBODY FUSION;  Surgeon: Judith Part, MD;  Location: [place];  Service: Neurosurgery;  Laterality: Left;  3C/RM 21 to follow   TRANSTHORACIC ECHOCARDIOGRAM  07-09-2016  dr Aundra Dubin   ef 55-60%, grade 1 diastoic dysfunction/  mild TR   TRANSURETHRAL RESECTION OF BLADDER TUMOR N/A 03/11/2018   Procedure: TRANSURETHRAL RESECTION OF BLADDER TUMOR (TURBT) 2-5cm;  Surgeon: Festus Aloe, MD;  Location: [facility];  Service: Urology;  Laterality: N/A;   UPPER EXTREMITY  ANGIOGRAPHY Left 07/11/2020   Procedure: Upper Extremity Angiography;  Surgeon: Waynetta Sandy, MD;  Location: [place] [facility];  Service: Cardiovascular;  Laterality: Left;   VAGINAL HYSTERECTOMY  1973   VIDEO ASSISTED THORACOSCOPY (VATS)/WEDGE RESECTION Right 09-11-2005  &  06-14-2011   dr Fara Boros  [facility]   both RUL    Allergies  Allergen Reactions   Sinequan [Doxepin] Other (See Comments)    Kept her awake    Sulfa Antibiotics Swelling   Sulfur     Other reaction(s): Unknown    Family History  Problem Relation Age of Onset   Colon cancer Mother        colon   COPD Mother        brown lung   Hypertension Mother    Diabetes Mother    Emphysema Mother    Coronary artery disease Father    Heart attack Father    Sudden death Father    Heart disease Father    Cancer Sister        throat   Hypertension Sister    Coronary artery disease Brother    Heart attack Brother        early 92s   Throat cancer Sister    Ovarian cancer Sister        fallopian tube cancer in her 26s   Melanoma Sister    Hypertension Sister    Pancreatic cancer Sister    Melanoma Niece        dx in her 65s   Breast cancer Niece 6    Social History   Socioeconomic History   Marital status: Married    Spouse name: Not on file   Number of children: 3   Years of education: 12   Highest education level: Not on file  Occupational History   Occupation: Chiropractor: RETIRED    Comment: retired  Tobacco Use   Smoking status: Former    Packs/day: 0.25    Years: 50.00    Total pack years: 12.50    Types: Cigarettes    Quit date: 05/18/2016    Years since quitting: 6.6   Smokeless tobacco: Never  Vaping Use   Vaping Use: Never used  Substance and Sexual Activity   Alcohol use: Yes    Alcohol/week: 14.0 standard drinks of alcohol    Types: 14 Cans of beer per week    Comment: every night   Drug use: No   Sexual activity: Not on file  Other Topics Concern   Not on  file  Social History Narrative   HSG, beauty school. Married '69 -12 yrs/widowed; married '79 - 72yr/divorced; married '87.  2 sons - '70, '74, 1 srtep-son; 1 grandchild. 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 SO - good health.  NO history of abuse.  ACP - full code and all heroic measures.    Social Determinants of Health   Financial Resource Strain: Low Risk  (12/21/2020)   Overall Financial Resource  Strain (CARDIA)    Difficulty of Paying Living Expenses: Not hard at all  Food Insecurity: No Food Insecurity (12/21/2020)   Hunger Vital Sign    Worried About Running Out of Food in the Last Year: Never true    Ran Out of Food in the Last Year: Never true  Transportation Needs: No Transportation Needs (12/21/2020)   PRAPARE - Hydrologist (Medical): No    Lack of Transportation (Non-Medical): No  Physical Activity: Inactive (12/21/2020)   Exercise Vital Sign    Days of Exercise per Week: 0 days    Minutes of Exercise per Session: 0 min  Stress: No Stress Concern Present (12/21/2020)   [place]    Feeling of Stress : Not at all  Social Connections: Socially Isolated (12/21/2020)   Social Connection and Isolation Panel [NHANES]    Frequency of Communication with Friends and Family: Once a week    Frequency of Social Gatherings with Friends and Family: Never    Attends Religious Services: Never    Marine scientist or Organizations: No    Attends Archivist Meetings: Never    Marital Status: Married   Today's Vitals   12/25/22 1352  BP: 120/70  Pulse: 60  Resp: 16  SpO2: 99%  Weight: 118 lb (53.5 kg)  Height: 5\' 5"  (1.651 m)   Body mass index is 19.64 kg/m.  Wt Readings from Last 3 Encounters:  12/25/22 118 lb (53.5 kg)  12/24/22 118 lb 3.7 oz (53.6 kg)  11/29/22 115 lb 3.2 oz (52.3 kg)   Physical Exam Vitals and nursing note reviewed.  Constitutional:      General: She is not  in acute distress.    Appearance: She is well-developed.  HENT:     Head: Normocephalic and atraumatic.     Right Ear: External ear normal.     Left Ear: External ear normal.     Ears:     Comments: Hearing aids in place.    Mouth/Throat:     Mouth: Mucous membranes are moist.     Pharynx: Oropharynx is clear. Uvula midline.  Eyes:     Conjunctiva/sclera: Conjunctivae normal.     Pupils: Pupils are equal, round, and reactive to light.  Neck:     Thyroid: No thyroid mass.     Trachea: No tracheal deviation.  Cardiovascular:     Rate and Rhythm: Normal rate and regular rhythm.     Heart sounds: No murmur heard.    Comments: DP pulses palpable. Pulmonary:     Effort: Pulmonary effort is normal. No respiratory distress.     Breath sounds: Normal breath sounds.  Abdominal:     Palpations: Abdomen is soft. There is no mass.     Tenderness: There is no abdominal tenderness.  Genitourinary:    Comments: No concerns. Musculoskeletal:     Comments: No signs of synovitis appreciated.  Lymphadenopathy:     Cervical: No cervical adenopathy.  Skin:    General: Skin is warm.     Findings: No erythema or rash.  Neurological:     General: No focal deficit present.     Mental Status: She is alert and oriented to person, place, and time.     Cranial Nerves: No cranial nerve deficit.     Deep Tendon Reflexes:     Reflex Scores:      Bicep reflexes are 2+ on the  right side and 2+ on the left side.      Patellar reflexes are 2+ on the right side and 2+ on the left side.    Comments: Antalgic gait, not assisted.  Psychiatric:        Mood and Affect: Mood and affect normal.   ASSESSMENT AND PLAN: [name] was here today annual physical examination.   Lab Results  Component Value Date   TSH 0.02 (L) 12/25/2022   Lab Results  Component Value Date   CREATININE 0.73 12/25/2022   BUN 15 12/25/2022   NA 137 12/25/2022   K 3.7 12/25/2022   CL 97 12/25/2022   CO2 29 12/25/2022    Lab Results  Component Value Date   ALT 64 (H) 12/25/2022   AST 49 (H) 12/25/2022   ALKPHOS 167 (H) 12/25/2022   BILITOT 0.5 12/25/2022   Routine general medical examination at a health care facility Assessment & Plan: We discussed the importance of regular physical activity (low impact) and healthy diet for prevention of complications. High calorie diet to prevent further wt loss. Preventive guidelines reviewed. Vaccination up to date. Next CPE in a year.   Hypothyroidism, unspecified type Assessment & Plan: Last TSH 1.37 in 06/2022. Continue Levothyroxine 112 mcg daily.  Orders: -     TSH; Future  Essential hypertension Assessment & Plan: BP has been adequately controlled. Continue non pharmacologic treatment.  Orders: -     Basic metabolic panel; Future -     Ezetimibe; Take 1 tablet (10 mg total) by mouth daily.  Dispense: 90 tablet; Refill: 1  Malignant neoplasm of urinary bladder, unspecified site [facility]) Assessment & Plan: Follows with urologist.   PAD (peripheral artery disease) ([place]) Assessment & Plan: On Plavix,Platel,Atorvastatin,and Zetia. Follows with cardiologist. LDL 40 in 12/2021.   Chronic respiratory failure with hypoxia [facility]) Assessment & Plan: On supplemental nocturnal O2 supplementation 2 LPM. Follows with pulmonologist.   Essential (hemorrhagic) thrombocythemia ([place]) Assessment & Plan: Problem has been stable for the past 1-2 years.   Coronary artery disease involving native coronary artery of native heart without angina pectoris Assessment & Plan: Continue Atorvastatin,Aspirin, and Zetia. Follows with cardiologist.   Orders: -     Ezetimibe; Take 1 tablet (10 mg total) by mouth daily.  Dispense: 90 tablet; Refill: 1  Hyperlipidemia, unspecified hyperlipidemia type Assessment & Plan: LDL goal < 70. Last LDL in 12/2021 was 40. She is not fasting today, so will plan on FLP next visit if she does not have it done at her  cardiologist's office. Continue Atorvastatin and zetia same dose.  Orders: -     Ezetimibe; Take 1 tablet (10 mg total) by mouth daily.  Dispense: 90 tablet; Refill: 1 -     Hepatic function panel; Future  Gastroesophageal reflux disease, unspecified whether esophagitis present Assessment & Plan: Still symptomatic. Increase Protonix from 20 mg daily to 20 mg bid before meals. GERD precautions to continue.  Orders: -     Pantoprazole Sodium; Take 1 tablet (20 mg total) by mouth 2 (two) times daily before a meal.  Dispense: 100 tablet; Refill: 2  Encounter for HCV screening test for low risk patient -     Hepatitis C antibody; Future  COPD, frequent exacerbations ([place]) Assessment & Plan: Reports that problem has improved some since her last visit. On Trelegy Ellipta 200-62.5-25 mcg ,Pulmicort,and Albuterol inh. Continue following with pulmonologist.   Systolic and diastolic CHF, chronic ([place]) Assessment & Plan: Euvolemic on examination  today. Echo on 08/02/22 LVEF55-60% and grade II diastolic dysfunction. Continue Furosemide 40 mg bid. Follows with cardiologist.   Return in about 6 months (around 06/27/2023) for chronic problems.  Errin Whitelaw G. [place], MD  [facility]. [place] office.

## 2022-12-25 ENCOUNTER — Encounter: Payer: Self-pay | Admitting: Family Medicine

## 2022-12-25 ENCOUNTER — Ambulatory Visit: Payer: 59

## 2022-12-25 ENCOUNTER — Ambulatory Visit (INDEPENDENT_AMBULATORY_CARE_PROVIDER_SITE_OTHER): Payer: 59 | Admitting: Family Medicine

## 2022-12-25 VITALS — BP 120/70 | HR 60 | Resp 16 | Ht 65.0 in | Wt 118.0 lb

## 2022-12-25 DIAGNOSIS — G4733 Obstructive sleep apnea (adult) (pediatric): Secondary | ICD-10-CM

## 2022-12-25 DIAGNOSIS — I10 Essential (primary) hypertension: Secondary | ICD-10-CM | POA: Diagnosis not present

## 2022-12-25 DIAGNOSIS — I2510 Atherosclerotic heart disease of native coronary artery without angina pectoris: Secondary | ICD-10-CM | POA: Diagnosis not present

## 2022-12-25 DIAGNOSIS — E785 Hyperlipidemia, unspecified: Secondary | ICD-10-CM

## 2022-12-25 DIAGNOSIS — J9611 Chronic respiratory failure with hypoxia: Secondary | ICD-10-CM | POA: Diagnosis not present

## 2022-12-25 DIAGNOSIS — K219 Gastro-esophageal reflux disease without esophagitis: Secondary | ICD-10-CM | POA: Diagnosis not present

## 2022-12-25 DIAGNOSIS — Z79899 Other long term (current) drug therapy: Secondary | ICD-10-CM

## 2022-12-25 DIAGNOSIS — I739 Peripheral vascular disease, unspecified: Secondary | ICD-10-CM | POA: Diagnosis not present

## 2022-12-25 DIAGNOSIS — Z1159 Encounter for screening for other viral diseases: Secondary | ICD-10-CM

## 2022-12-25 DIAGNOSIS — J449 Chronic obstructive pulmonary disease, unspecified: Secondary | ICD-10-CM

## 2022-12-25 DIAGNOSIS — C679 Malignant neoplasm of bladder, unspecified: Secondary | ICD-10-CM | POA: Insufficient documentation

## 2022-12-25 DIAGNOSIS — D473 Essential (hemorrhagic) thrombocythemia: Secondary | ICD-10-CM

## 2022-12-25 DIAGNOSIS — I5042 Chronic combined systolic (congestive) and diastolic (congestive) heart failure: Secondary | ICD-10-CM

## 2022-12-25 DIAGNOSIS — E039 Hypothyroidism, unspecified: Secondary | ICD-10-CM | POA: Diagnosis not present

## 2022-12-25 DIAGNOSIS — Z0000 Encounter for general adult medical examination without abnormal findings: Secondary | ICD-10-CM | POA: Diagnosis not present

## 2022-12-25 DIAGNOSIS — J441 Chronic obstructive pulmonary disease with (acute) exacerbation: Secondary | ICD-10-CM

## 2022-12-25 DIAGNOSIS — R7989 Other specified abnormal findings of blood chemistry: Secondary | ICD-10-CM

## 2022-12-25 DIAGNOSIS — I25119 Atherosclerotic heart disease of native coronary artery with unspecified angina pectoris: Secondary | ICD-10-CM

## 2022-12-25 DIAGNOSIS — I5022 Chronic systolic (congestive) heart failure: Secondary | ICD-10-CM

## 2022-12-25 MED ORDER — EZETIMIBE 10 MG PO TABS: 10.0000 mg | ORAL_TABLET | Freq: Every day | ORAL | 1 refills | Status: DC

## 2022-12-25 MED ORDER — PANTOPRAZOLE SODIUM 20 MG PO TBEC: 20.0000 mg | DELAYED_RELEASE_TABLET | Freq: Two times a day (BID) | ORAL | 2 refills | Status: DC

## 2022-12-25 NOTE — Patient Instructions (Addendum)
A few things to remember from today's visit:  Routine general medical examination at a health care facility  Hypothyroidism, unspecified type - Plan: TSH  Essential hypertension - Plan: Basic metabolic panel, ezetimibe (ZETIA) 10 MG tablet  Malignant neoplasm of urinary bladder, unspecified site (HCC)  COPD, severe ([place]), Chronic  Chronic systolic heart failure ([place]), Chronic  PAD (peripheral artery disease) ([place]), Chronic  Chronic respiratory failure with hypoxia ([place]), Chronic  Essential (hemorrhagic) thrombocythemia ([place]), Chronic  Atherosclerosis of native coronary artery of native heart with angina pectoris ([place]), Chronic  Medication management - Plan: ezetimibe (ZETIA) 10 MG tablet  Coronary artery disease involving native coronary artery of native heart without angina pectoris - Plan: ezetimibe (ZETIA) 10 MG tablet  Hyperlipidemia, unspecified hyperlipidemia type - Plan: ezetimibe (ZETIA) 10 MG tablet, Hepatic function panel  Gastroesophageal reflux disease, unspecified whether esophagitis present - Plan: pantoprazole (PROTONIX) 20 MG tablet  Encounter for HCV screening test for low risk patient - Plan: Hepatitis C antibody  Caution with some of your meds, risk of medication interaction. Fall precautions.  If you need refills for medications you take chronically, please call your pharmacy. Do not use My Chart to request refills or for acute issues that need immediate attention. If you send a my chart message, it may take a few days to be addressed, specially if I am not in the office.  Please be sure medication list is accurate. If a new problem present, please set up appointment sooner than planned today.

## 2022-12-26 LAB — HEPATIC FUNCTION PANEL
ALT: 64 U/L — ABNORMAL HIGH (ref 0–35)
AST: 49 U/L — ABNORMAL HIGH (ref 0–37)
Albumin: 4 g/dL (ref 3.5–5.2)
Alkaline Phosphatase: 167 U/L — ABNORMAL HIGH (ref 39–117)
Bilirubin, Direct: 0.1 mg/dL (ref 0.0–0.3)
Total Bilirubin: 0.5 mg/dL (ref 0.2–1.2)
Total Protein: 7.8 g/dL (ref 6.0–8.3)

## 2022-12-26 LAB — BASIC METABOLIC PANEL
BUN: 15 mg/dL (ref 6–23)
CO2: 29 mEq/L (ref 19–32)
Calcium: 10.1 mg/dL (ref 8.4–10.5)
Chloride: 97 mEq/L (ref 96–112)
Creatinine, Ser: 0.73 mg/dL (ref 0.40–1.20)
GFR: 78.94 mL/min (ref >60.00)
Glucose, Bld: 82 mg/dL (ref 70–99)
Potassium: 3.7 mEq/L (ref 3.5–5.1)
Sodium: 137 mEq/L (ref 135–145)

## 2022-12-26 LAB — TSH: TSH: 0.02 u[IU]/mL — ABNORMAL LOW (ref 0.35–5.50)

## 2022-12-28 DIAGNOSIS — Z1231 Encounter for screening mammogram for malignant neoplasm of breast: Secondary | ICD-10-CM | POA: Diagnosis not present

## 2022-12-29 NOTE — Assessment & Plan Note (Addendum)
Euvolemic on examination today. Echo on 08/02/22 LVEF55-60% and grade II diastolic dysfunction. Continue Furosemide 40 mg bid. Follows with cardiologist.

## 2022-12-29 NOTE — Assessment & Plan Note (Signed)
On supplemental nocturnal O2 supplementation 2 LPM. Follows with pulmonologist.

## 2022-12-29 NOTE — Assessment & Plan Note (Signed)
LDL goal < 70. Last LDL in 12/2021 was 40. She is not fasting today, so will plan on FLP next visit if she does not have it done at her cardiologist's office. Continue Atorvastatin and zetia same dose.

## 2022-12-29 NOTE — Assessment & Plan Note (Addendum)
On Plavix,Platel,Atorvastatin,and Zetia. Follows with cardiologist. LDL 40 in 12/2021.

## 2022-12-29 NOTE — Assessment & Plan Note (Signed)
We discussed the importance of regular physical activity (low impact) and healthy diet for prevention of complications. High calorie diet to prevent further wt loss. Preventive guidelines reviewed. Vaccination up to date. Next CPE in a year.

## 2022-12-29 NOTE — Assessment & Plan Note (Signed)
Still symptomatic. Increase Protonix from 20 mg daily to 20 mg bid before meals. GERD precautions to continue.

## 2022-12-29 NOTE — Assessment & Plan Note (Signed)
BP has been adequately controlled. Continue non pharmacologic treatment.

## 2022-12-29 NOTE — Assessment & Plan Note (Signed)
Continue Atorvastatin,Aspirin, and Zetia. Follows with cardiologist.

## 2022-12-29 NOTE — Assessment & Plan Note (Signed)
Last TSH 1.37 in 06/2022. Continue Levothyroxine 112 mcg daily.

## 2022-12-29 NOTE — Assessment & Plan Note (Signed)
Problem has been stable for the past 1-2 years.

## 2022-12-29 NOTE — Assessment & Plan Note (Signed)
Reports that problem has improved some since her last visit. On Trelegy Ellipta 200-62.5-25 mcg ,Pulmicort,and Albuterol inh. Continue following with pulmonologist.

## 2022-12-29 NOTE — Assessment & Plan Note (Signed)
Follows with urologist.

## 2022-12-31 ENCOUNTER — Other Ambulatory Visit: Payer: Self-pay

## 2022-12-31 DIAGNOSIS — C50911 Malignant neoplasm of unspecified site of right female breast: Secondary | ICD-10-CM

## 2022-12-31 DIAGNOSIS — C50811 Malignant neoplasm of overlapping sites of right female breast: Secondary | ICD-10-CM

## 2022-12-31 DIAGNOSIS — Z1231 Encounter for screening mammogram for malignant neoplasm of breast: Secondary | ICD-10-CM

## 2022-12-31 NOTE — Progress Notes (Signed)
Pt is aware screening MM is indeterminate and we have ordered diag MM per NP. Orders faxed to [facility]. Fax confirmation received.

## 2023-01-02 ENCOUNTER — Telehealth: Payer: Self-pay | Admitting: Family Medicine

## 2023-01-02 NOTE — Telephone Encounter (Signed)
This was done by Oncology on 3/18.

## 2023-01-02 NOTE — Telephone Encounter (Signed)
Had mammogram, now needing order for [place] of breast sent to [facility].

## 2023-01-04 ENCOUNTER — Other Ambulatory Visit: Payer: Self-pay | Admitting: Family Medicine

## 2023-01-04 DIAGNOSIS — F419 Anxiety disorder, unspecified: Secondary | ICD-10-CM

## 2023-01-04 DIAGNOSIS — G4700 Insomnia, unspecified: Secondary | ICD-10-CM

## 2023-01-07 NOTE — Telephone Encounter (Signed)
It seems like she has one more refill available at her pharmacy, last Rx filled on 12/12/22. Thanks, BJ

## 2023-01-08 ENCOUNTER — Telehealth: Payer: Self-pay

## 2023-01-08 DIAGNOSIS — R922 Inconclusive mammogram: Secondary | ICD-10-CM | POA: Diagnosis not present

## 2023-01-08 DIAGNOSIS — R921 Mammographic calcification found on diagnostic imaging of breast: Secondary | ICD-10-CM | POA: Diagnosis not present

## 2023-01-08 DIAGNOSIS — R928 Other abnormal and inconclusive findings on diagnostic imaging of breast: Secondary | ICD-10-CM | POA: Diagnosis not present

## 2023-01-08 LAB — HM MAMMOGRAPHY

## 2023-01-08 NOTE — Telephone Encounter (Signed)
Received order requisition from [facility] for US guided bx to left breast for suspicious calcifications. Order signed per NP and she was scheduled for f/u phone visit for results 01/21/23 at 0830. She verbalized agreement and understanding. She knows to call with any concerns.

## 2023-01-09 DIAGNOSIS — G4733 Obstructive sleep apnea (adult) (pediatric): Secondary | ICD-10-CM | POA: Diagnosis not present

## 2023-01-14 ENCOUNTER — Ambulatory Visit: Payer: Medicare Other

## 2023-01-14 ENCOUNTER — Other Ambulatory Visit: Payer: Self-pay | Admitting: Radiology

## 2023-01-14 ENCOUNTER — Other Ambulatory Visit: Payer: Self-pay | Admitting: Family Medicine

## 2023-01-14 DIAGNOSIS — N6012 Diffuse cystic mastopathy of left breast: Secondary | ICD-10-CM | POA: Diagnosis not present

## 2023-01-14 DIAGNOSIS — Z853 Personal history of malignant neoplasm of breast: Secondary | ICD-10-CM | POA: Diagnosis not present

## 2023-01-14 DIAGNOSIS — R921 Mammographic calcification found on diagnostic imaging of breast: Secondary | ICD-10-CM | POA: Diagnosis not present

## 2023-01-20 DIAGNOSIS — J9601 Acute respiratory failure with hypoxia: Secondary | ICD-10-CM | POA: Diagnosis not present

## 2023-01-20 DIAGNOSIS — R0602 Shortness of breath: Secondary | ICD-10-CM | POA: Diagnosis not present

## 2023-01-20 DIAGNOSIS — J449 Chronic obstructive pulmonary disease, unspecified: Secondary | ICD-10-CM | POA: Diagnosis not present

## 2023-01-21 ENCOUNTER — Encounter: Payer: Self-pay | Admitting: Adult Health

## 2023-01-21 ENCOUNTER — Inpatient Hospital Stay: Payer: 59 | Attending: Adult Health | Admitting: Adult Health

## 2023-01-21 ENCOUNTER — Encounter: Payer: Self-pay | Admitting: Internal Medicine

## 2023-01-21 DIAGNOSIS — Z87891 Personal history of nicotine dependence: Secondary | ICD-10-CM | POA: Insufficient documentation

## 2023-01-21 DIAGNOSIS — C50811 Malignant neoplasm of overlapping sites of right female breast: Secondary | ICD-10-CM

## 2023-01-21 DIAGNOSIS — Z9221 Personal history of antineoplastic chemotherapy: Secondary | ICD-10-CM | POA: Insufficient documentation

## 2023-01-21 DIAGNOSIS — Z170 Estrogen receptor positive status [ER+]: Secondary | ICD-10-CM

## 2023-01-21 DIAGNOSIS — C50911 Malignant neoplasm of unspecified site of right female breast: Secondary | ICD-10-CM

## 2023-01-21 DIAGNOSIS — Z853 Personal history of malignant neoplasm of breast: Secondary | ICD-10-CM | POA: Insufficient documentation

## 2023-01-21 DIAGNOSIS — N6489 Other specified disorders of breast: Secondary | ICD-10-CM

## 2023-01-21 DIAGNOSIS — D75839 Thrombocytosis, unspecified: Secondary | ICD-10-CM | POA: Insufficient documentation

## 2023-01-21 DIAGNOSIS — J449 Chronic obstructive pulmonary disease, unspecified: Secondary | ICD-10-CM | POA: Diagnosis not present

## 2023-01-21 DIAGNOSIS — Z9011 Acquired absence of right breast and nipple: Secondary | ICD-10-CM | POA: Insufficient documentation

## 2023-01-21 NOTE — Assessment & Plan Note (Signed)
She is confusing dyspnea with hypoxia.  I think there is an anxiety component as well. Plan-encouraged reduction of oxygen to 2 L.  Get pulse oximeter.

## 2023-01-21 NOTE — Assessment & Plan Note (Signed)
Dyspnea at least partly reflects deconditioning and weight loss. Plan-continue current respiratory medicines.

## 2023-01-21 NOTE — Assessment & Plan Note (Signed)
We need to update documentation.  She has been intermittently using a borrowed CPAP machine and I am not convinced that she is benefiting from it. Plan-schedule home sleep test.

## 2023-01-21 NOTE — Assessment & Plan Note (Signed)
[name] is a 77 year old woman with history of stage IA ER/PR positive breast cancer diagnosed in 2010 s/p right mastectomy, adjuvant chemotherapy, followed by aromatase inhibitor therapy x 5 years completed in June, 2016.   [name] has completed her right-sided breast cancer treatment since 2016 and has been doing quite well.  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  For her pulmonary state we continue to closely observe her left breast and will repeat diagnostic mammogram in 6 months.  She does still have a knot at the left breast and she will let us know if this does not improve over the next couple of weeks.  We will continue to see her annually and long-term survivorship.

## 2023-01-21 NOTE — Progress Notes (Signed)
[name]:    [name], [name], MD [address] [place] [address]   [name], [name] Staging form: [name], [name] 7th Edition - Clinical: Stage IA (T1c, N0, M0) - Signed by Lowella Dell, MD on 02/17/2015   SUMMARY OF ONCOLOGIC HISTORY: Per Dr. [name] note from 06/21/2017:  [name] woman: history of [name] [name]: pT1c pN0, stage IA, estrogen and progesterone receptor positive.   1. S/p Christy mastectomy with myocutaneous flap 09/19/2009   2. Adjuvant chemo with 4 cycles of  Taxotere/ Cytoxan   3. Femara, followed by Arimidex, started roughly Jan 2011, with a few months' interruption   4.  Completed 5 years of antiestrogen therapy June 2016   5.  Also s/p left [name] lumpectomy in may 2014 ,negative for atypia or malignancy   6. Intermittent thrombocytosis, mild likely multifocal factorial (borderline iron deficiency, positive ANA and elevated C-reactive protein)  CURRENT THERAPY: observation  INTERVAL HISTORY: [name] 77 y.o. female returns for discussion following a left [name] needle core biopsy in the upper outer [name] that occurred on January 14, 2023.  This demonstrated fibrocystic changes with dense stromal fibrosis, adenosis, and associated calcifications.  Focal pseudo angiomatous stromal hyperplasia was noted.  She was given the results and was told her biopsy was benign and he was recommended repeat mammogram in 1 years time.  She is still having some swelling and tenderness at the site.    Patient Active Problem List   Diagnosis Date Noted   Malignant neoplasm of urinary bladder, unspecified site 12/25/2022   Spondylolisthesis of lumbar region 10/22/2022   Chronic back pain 11/04/2021   Systolic and diastolic CHF, chronic 11/04/2021   PVD (peripheral vascular disease) 11/04/2021   OSA on CPAP 11/04/2021   Chronic diarrhea 11/04/2021   GERD (gastroesophageal reflux  disease) 11/04/2021   CAP (community acquired pneumonia) 08/01/2021   Sepsis due to pneumonia 07/31/2021   Systolic CHF, chronic 07/31/2021   Prediabetes 07/19/2021   Hemoptysis 10/05/2020   Hypokalemia 09/26/2020   OSA (obstructive sleep apnea) 09/11/2020   SOB (shortness of breath)    Acute respiratory failure with hypoxia 08/14/2020   Esophageal dysmotility 08/14/2020   Atherosclerosis of aorta 08/14/2020   Snoring 06/26/2020   Shortness of breath    Acute systolic CHF (congestive heart failure) 05/21/2020   COPD exacerbation 05/20/2020   Acute on chronic respiratory failure with hypoxia 05/20/2020   Genetic testing 12/12/2018   Family history of [name] [name]    Family history of ovarian [name]    Family history of pancreatic [name]    Family history of colon [name]    Family history of melanoma    History of [name] [name]    Chronic respiratory failure with hypoxia 10/14/2017   COPD with acute exacerbation 10/10/2017   Flu vaccine need 09/09/2017   Anxiety disorder 09/09/2017   Fibromyalgia 09/09/2017   Essential (hemorrhagic) thrombocythemia 06/21/2017   Malignant neoplasm of overlapping sites of [name] in female, estrogen receptor positive 06/05/2017   Dermatitis 04/02/2017   Edema of Christy lower extremity 02/26/2017   Positive ANA (antinuclear antibody) 02/08/2017   Elevated IgE level and eosinophilia 12/31/2016   Rash and nonspecific skin eruption 11/28/2016   Allergic rhinitis 11/28/2016   History of bacterial pneumonia 08/09/2016   Routine general medical examination at a health care facility 08/12/2015   Insomnia 03/04/2015   [name] [name], [name] 02/17/2015  CAD (coronary artery disease) 07/11/2011   Tobacco abuse 03/27/2011   Chronic mesenteric ischemia 03/27/2011   Solitary pulmonary nodule 03/27/2011   PAD (peripheral artery disease) 03/04/2011   Arthropathy 11/09/2010   Hypothyroidism 09/20/2009   Hyperlipidemia 09/20/2009   Hearing loss  09/14/2008   Essential hypertension 09/08/2007   COPD, frequent exacerbations 09/08/2007   GERD 09/08/2007    is allergic to sinequan [doxepin], sulfa antibiotics, and sulfur.  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    05/ 2018  chronic non-occlusive DVT RLE   Chronic kidney disease    COPD, frequent exacerbations    03-06-2018 per pt last exacerbation 12/ 2018   Coronary artery disease    cardiologist-  dr Shirlee Latch-- 03-11-2018 er cath , 80% stenosis in the ostial second diagonal   Dyspnea on minimal exertion    Emphysema/COPD    CAT score- 17   Family history of [name] [name]    Family history of colon [name]    Family history of melanoma    Family history of ovarian [name]    Family history of pancreatic [name]    Fibromyalgia 1995   GERD (gastroesophageal reflux disease)    Hiatal hernia    History of [name] [name]    History of diverticulitis of colon    2002  s/p  sigmoid colectomy   History of multiple pulmonary nodules    hx RUL nodules x2  s/p Christy VATS w/ wedge resection 09-11-2005 and 06-14-2011  both  necrotizing granulomatous inflammation w/ cystic area of necrosis & focal calcification   History of [name] [name] oncologist-  dr [name]-- no recurrence   dx 2010--  IDC, Stage IA , ER/PR+,  (pT1c pN0) 11-09-2008 Christy lumpectomy;  12-18-2008 Christy simple mastectomy for DCIS margins;  completed chemotherapy 2010 (no radiation) and completed antiestrogen therapy   Hx of colonic polyps    Dr. 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   Nocturia    OA (osteoarthritis)    thumbs   On supplemental oxygen therapy    03-06-2018 per pt uses only at night,  checks O2 sats at home,  stated am sat  89% after moving around average 93-94% with RA   Osteoporosis    Peripheral arterial occlusive disease vascular-- dr chen/ dr Randie Heinz   proximal Christy SFA severe focal stenosis with collaterals from the PFA/  04/ 2012 occluded celiac and SMA arteries with distal reconstitution w/ patent IMA   Peripheral vascular disease    chronic DVT RLE,  mesenteric vascular disease   Pneumonia    Christy lower lobe pulmonary nodule    Chest CT 08-29-2017   Sleep apnea    Stage 3 severe COPD by GOLD classification hx frequent exacerbations--   pulmologist-  dr Inocente Salles--  per lov note , dated 12-20-2017, oxyogen 2L is prescribed for use with exertion (but pt only uses mostly at night), O2 sats on RA run the 80s , this day sat 86% RA and with 2L O2 sat 92%   Varicose veins of leg with swelling    varicose vein surgery - Dr. Guss Bunde   Wears glasses    Wears hearing aid  in both ears     SURGICAL HISTORY: Past Surgical History:  Procedure Laterality Date   ABDOMINAL AORTOGRAM N/A 07/11/2020   Procedure: ABDOMINAL AORTOGRAM;  Surgeon: Maeola Harman, MD;  Location: [facility] INVASIVE CV LAB;  Service: Cardiovascular;  Laterality: N/A;   AORTIC ARCH ANGIOGRAPHY N/A 07/11/2020   Procedure: AORTIC ARCH ANGIOGRAPHY;  Surgeon: Maeola Harman, MD;  Location: [facility] INVASIVE CV LAB;  Service: Cardiovascular;  Laterality: N/A;   [name] EXCISIONAL BIOPSY Left 10/15/2008   [name] LUMPECTOMY W/ NEEDLE LOCALIZATION Christy 11-09-2008    dr cornett   [facility]   CARDIAC CATHETERIZATION  03-12-2011   dr Shirlee Latch   70-80% ostial stenosis in small second diagonal (appears to small for intervention),  dLAD 40-50%,  minimal luminal irregulartieis involoving LCFx and RCA,  LVEF 55%   CATARACT EXTRACTION W/ INTRAOCULAR LENS  IMPLANT, BILATERAL  2011   CYSTOSCOPY N/A 03/11/2018   Procedure: CYSTOSCOPY WITH INSTILLATION OF POST OPERATIVE EPIRUBICIN;  Surgeon: Jerilee Field, MD;  Location: [facility] ORS;  Service: Urology;   Laterality: N/A;   LEFT HEART CATH AND CORONARY ANGIOGRAPHY N/A 05/25/2020   Procedure: LEFT HEART CATH AND CORONARY ANGIOGRAPHY;  Surgeon: Lennette Bihari, MD;  Location: [facility] INVASIVE CV LAB;  Service: Cardiovascular;  Laterality: N/A;   MASTECTOMY Christy    PARTIAL COLECTOMY  2002   sigmoid and appectomy (diverticulitis)   PARTIAL MASTECTOMY WITH NEEDLE LOCALIZATION Left 02/23/2013   Procedure:  LEFT PARTIAL MASTECTOMY WITH NEEDLE LOCALIZATION;  Surgeon: [name], MD;  Location: [facility];  Service: General;  Laterality: Left;   PERIPHERAL VASCULAR INTERVENTION  07/11/2020   Procedure: PERIPHERAL VASCULAR INTERVENTION;  Surgeon: Maeola Harman, MD;  Location: [facility]VAS[facility] CV LAB;  Service: Cardiovascular;;   RECONSTRUCTION [name] W/ LATISSIMUS DORSI FLAP Christy 05-30-2009   dr Odis Luster  [facility]   REDUCTION MAMMAPLASTY Left    SIMPLE MASTECTOMY Christy 12-28-2008    dr Luisa Hart  [facility]   TRANSFORAMINAL LUMBAR INTERBODY FUSION W/ MIS 1 LEVEL Left 10/22/2022   Procedure: LUMBAR THREE-FOUR MINIMALLY INVASIVE TRANSFORAMINAL LUMBAR INTERBODY FUSION;  Surgeon: Jadene Pierini, MD;  Location: [facility] OR;  Service: Neurosurgery;  Laterality: Left;  3C/RM 21 to follow   TRANSTHORACIC ECHOCARDIOGRAM  07-09-2016  dr Shirlee Latch   ef 55-60%, grade 1 diastoic dysfunction/  mild TR   TRANSURETHRAL RESECTION OF BLADDER TUMOR N/A 03/11/2018   Procedure: TRANSURETHRAL RESECTION OF BLADDER TUMOR (TURBT) 2-5cm;  Surgeon: Jerilee Field, MD;  Location: [facility];  Service: Urology;  Laterality: N/A;   UPPER EXTREMITY ANGIOGRAPHY Left 07/11/2020   Procedure: Upper Extremity Angiography;  Surgeon: Maeola Harman, MD;  Location: [facility] INVASIVE CV LAB;  Service: Cardiovascular;  Laterality: Left;   VAGINAL HYSTERECTOMY  1973   VIDEO ASSISTED THORACOSCOPY (VATS)/WEDGE RESECTION Christy 09-11-2005  &  06-14-2011   dr Sloan Leiter  [facility]   both RUL    SOCIAL HISTORY: Social History   Socioeconomic  History   Marital status: Married    Spouse name: Not on file   Number of children: 3   Years of education: 12   Highest education level: Not on file  Occupational History   Occupation: Magazine features editor: RETIRED    Comment: retired  Tobacco Use   Smoking status: Former    Packs/day: 0.25    Years: 50.00    Additional pack years: 0.00    Total pack years: 12.50    Types: Cigarettes    Quit date: 05/18/2016  Years since quitting: 6.6   Smokeless tobacco: Never  Vaping Use   Vaping Use: Never used  Substance and Sexual Activity   Alcohol use: Yes    Alcohol/week: 14.0 standard drinks of alcohol    Types: 14 Cans of beer per week    Comment: every night   Drug use: No   Sexual activity: Not on file  Other Topics Concern   Not on file  Social History Narrative   HSG, beauty school. Married '69 -7 yrs/widowed; married '79 - 3437yr/divorced; married '87.  2 sons - '70, '74, 1 srtep-son; 1 grandchild. 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 SO - good health.  NO history of abuse.  ACP - full code and all heroic measures.    Social Determinants of Health   Financial Resource Strain: Low Risk  (12/21/2020)   Overall Financial Resource Strain (CARDIA)    Difficulty of Paying Living Expenses: Not hard at all  Food Insecurity: No Food Insecurity (12/21/2020)   Hunger Vital Sign    Worried About Running Out of Food in the Last Year: Never true    Ran Out of Food in the Last Year: Never true  Transportation Needs: No Transportation Needs (12/21/2020)   PRAPARE - Administrator, Civil ServiceTransportation    Lack of Transportation (Medical): No    Lack of Transportation (Non-Medical): No  Physical Activity: Inactive (12/21/2020)   Exercise Vital Sign    Days of Exercise per Week: 0 days    Minutes of Exercise per Session: 0 min  Stress: No Stress Concern Present (12/21/2020)   [employer]ational [employer] - Occupational Stress Questionnaire    Feeling of Stress : Not at all  Social Connections: Socially  Isolated (12/21/2020)   Social Connection and Isolation Panel [NHANES]    Frequency of Communication with Friends and Family: Once a week    Frequency of Social Gatherings with Friends and Family: Never    Attends Religious Services: Never    Database administratorActive Member of Clubs or Organizations: No    Attends BankerClub or Organization Meetings: Never    Marital Status: Married  Catering managerntimate Partner Violence: Not At Risk (12/21/2020)   Humiliation, Afraid, Rape, and Kick questionnaire    Fear of Current or Ex-Partner: No    Emotionally Abused: No    Physically Abused: No    Sexually Abused: No    FAMILY HISTORY: Family History  Problem Relation Age of Onset   Colon [name] Mother        colon   COPD Mother        brown lung   Hypertension Mother    Diabetes Mother    Emphysema Mother    Coronary artery disease Father    Heart attack Father    Sudden death Father    Heart disease Father    [name] Sister        throat   Hypertension Sister    Coronary artery disease Brother    Heart attack Brother        early 1150s   Throat [name] Sister    Ovarian [name] Sister        fallopian tube [name] in her 5420s   Melanoma Sister    Hypertension Sister    Pancreatic [name] Sister    Melanoma Niece        dx in her 8040s   [name] [name] Niece 2345    Review of Systems  Constitutional:  Negative for appetite change, chills, fatigue, fever and unexpected weight  change.  HENT:   Negative for hearing loss, lump/mass and trouble swallowing.   Eyes:  Negative for eye problems and icterus.  Respiratory:  Negative for chest tightness, cough and shortness of breath.   Cardiovascular:  Negative for chest pain, leg swelling and palpitations.  Gastrointestinal:  Negative for abdominal distention, abdominal pain, constipation, diarrhea, nausea and vomiting.  Endocrine: Negative for hot flashes.  Genitourinary:  Negative for difficulty urinating.   Musculoskeletal:  Negative for arthralgias.  Skin:  Negative for itching  and rash.  Neurological:  Negative for dizziness, extremity weakness, headaches and numbness.  Hematological:  Negative for adenopathy. Does not bruise/bleed easily.  Psychiatric/Behavioral:  Negative for depression. The patient is not nervous/anxious.   All other systems reviewed and are negative.     PHYSICAL EXAMINATION  Patient sounds well she is in no apparent distress, mood and behavior are normal, speech is normal.    LABORATORY DATA: None for this visit     ASSESSMENT and THERAPY PLAN:   [name] [name], [name] (HCC) [place]te is a 77 year old woman with history of stage IA ER/PR positive [name] [name] diagnosed in 2010 s/p Christy mastectomy, adjuvant chemotherapy, followed by aromatase inhibitor therapy x 5 years completed in June, 2016.   [name] has completed her Christy-sided [name] [name] treatment since 2016 and has been doing quite well.  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  For her pulmonary state we continue to closely observe her left [name] and will repeat diagnostic mammogram in 6 months.  She does still have a knot at the left [name] and she will let us know if this does not improve over the next couple of weeks.  We will continue to see her annually and long-term survivorship.  Follow [name] instructions:    -Return to [name] center 11/2023  -Left [name] diagnostic mammogram in October 2024   The patient was provided an opportunity to ask questions and all were answered. The patient agreed with the plan and demonstrated an understanding of the instructions.   The patient was advised to call back or seek an in-person evaluation if the symptoms worsen or if the condition fails to improve as anticipated.   I provided 10 minutes of non face-to-face telephone visit time during this encounter, and > 50% was spent counseling as documented under my assessment & plan.  Lillard Anes, NP 01/21/23 8:58 AM Medical Oncology and Hematology [facility] [address] [place], [place] [address] Tel. [phone]    Fax. [phone]

## 2023-01-29 ENCOUNTER — Other Ambulatory Visit (INDEPENDENT_AMBULATORY_CARE_PROVIDER_SITE_OTHER): Payer: 59

## 2023-01-29 ENCOUNTER — Ambulatory Visit (INDEPENDENT_AMBULATORY_CARE_PROVIDER_SITE_OTHER): Payer: 59

## 2023-01-29 DIAGNOSIS — R7989 Other specified abnormal findings of blood chemistry: Secondary | ICD-10-CM

## 2023-01-29 DIAGNOSIS — E039 Hypothyroidism, unspecified: Secondary | ICD-10-CM

## 2023-01-29 DIAGNOSIS — E538 Deficiency of other specified B group vitamins: Secondary | ICD-10-CM

## 2023-01-29 DIAGNOSIS — Z1159 Encounter for screening for other viral diseases: Secondary | ICD-10-CM | POA: Diagnosis not present

## 2023-01-29 LAB — HEPATIC FUNCTION PANEL
ALT: 26 U/L (ref 0–35)
AST: 26 U/L (ref 0–37)
Albumin: 4.6 g/dL (ref 3.5–5.2)
Alkaline Phosphatase: 142 U/L — ABNORMAL HIGH (ref 39–117)
Bilirubin, Direct: 0.2 mg/dL (ref 0.0–0.3)
Total Bilirubin: 0.4 mg/dL (ref 0.2–1.2)
Total Protein: 8.8 g/dL — ABNORMAL HIGH (ref 6.0–8.3)

## 2023-01-29 LAB — T4, FREE: Free T4: 1.18 ng/dL (ref 0.60–1.60)

## 2023-01-29 LAB — TSH: TSH: 1.79 u[IU]/mL (ref 0.35–5.50)

## 2023-01-29 MED ORDER — CYANOCOBALAMIN 1000 MCG/ML IJ SOLN
1000.0000 ug | Freq: Once | INTRAMUSCULAR | Status: AC
Start: 2023-01-29 — End: 2023-01-29
  Administered 2023-01-29: 1000 ug via INTRAMUSCULAR

## 2023-01-29 NOTE — Progress Notes (Signed)
Per orders of Dr. Jordan, injection of Cyanocobalamin 1000 mcg given by Emillia Weatherly L Urijah Raynor. Patient tolerated injection well.  

## 2023-01-30 LAB — HEPATITIS C ANTIBODY: Hepatitis C Ab: NONREACTIVE

## 2023-02-01 ENCOUNTER — Other Ambulatory Visit: Payer: Self-pay | Admitting: Family Medicine

## 2023-02-01 DIAGNOSIS — G4700 Insomnia, unspecified: Secondary | ICD-10-CM

## 2023-02-01 DIAGNOSIS — F419 Anxiety disorder, unspecified: Secondary | ICD-10-CM

## 2023-02-05 ENCOUNTER — Other Ambulatory Visit: Payer: Self-pay

## 2023-02-05 ENCOUNTER — Inpatient Hospital Stay: Payer: 59

## 2023-02-05 ENCOUNTER — Inpatient Hospital Stay (HOSPITAL_BASED_OUTPATIENT_CLINIC_OR_DEPARTMENT_OTHER): Payer: 59 | Admitting: Adult Health

## 2023-02-05 ENCOUNTER — Other Ambulatory Visit: Payer: Self-pay | Admitting: *Deleted

## 2023-02-05 VITALS — BP 133/71 | HR 100 | Temp 96.8°F | Resp 16 | Ht 65.0 in | Wt 116.0 lb

## 2023-02-05 DIAGNOSIS — D75839 Thrombocytosis, unspecified: Secondary | ICD-10-CM | POA: Diagnosis not present

## 2023-02-05 DIAGNOSIS — C50811 Malignant neoplasm of overlapping sites of right female breast: Secondary | ICD-10-CM

## 2023-02-05 DIAGNOSIS — Z9221 Personal history of antineoplastic chemotherapy: Secondary | ICD-10-CM | POA: Diagnosis not present

## 2023-02-05 DIAGNOSIS — Z170 Estrogen receptor positive status [ER+]: Secondary | ICD-10-CM | POA: Diagnosis not present

## 2023-02-05 DIAGNOSIS — C679 Malignant neoplasm of bladder, unspecified: Secondary | ICD-10-CM

## 2023-02-05 DIAGNOSIS — Z853 Personal history of malignant neoplasm of breast: Secondary | ICD-10-CM | POA: Diagnosis not present

## 2023-02-05 DIAGNOSIS — Z87891 Personal history of nicotine dependence: Secondary | ICD-10-CM | POA: Diagnosis not present

## 2023-02-05 DIAGNOSIS — Z9011 Acquired absence of right breast and nipple: Secondary | ICD-10-CM | POA: Diagnosis not present

## 2023-02-05 LAB — CMP (CANCER CENTER ONLY)
ALT: 21 U/L (ref 0–44)
AST: 24 U/L (ref 15–41)
Albumin: 4.4 g/dL (ref 3.5–5.0)
Alkaline Phosphatase: 110 U/L (ref 38–126)
Anion gap: 10 (ref 5–15)
BUN: 17 mg/dL (ref 8–23)
CO2: 30 mmol/L (ref 22–32)
Calcium: 10.2 mg/dL (ref 8.9–10.3)
Chloride: 100 mmol/L (ref 98–111)
Creatinine: 0.87 mg/dL (ref 0.44–1.00)
GFR, Estimated: >60 mL/min (ref >60)
Glucose, Bld: 139 mg/dL — ABNORMAL HIGH (ref 70–99)
Potassium: 3.2 mmol/L — ABNORMAL LOW (ref 3.5–5.1)
Sodium: 140 mmol/L (ref 135–145)
Total Bilirubin: 0.5 mg/dL (ref 0.3–1.2)
Total Protein: 8.3 g/dL — ABNORMAL HIGH (ref 6.5–8.1)

## 2023-02-05 LAB — CBC WITH DIFFERENTIAL (CANCER CENTER ONLY)
Abs Immature Granulocytes: 0.01 10*3/uL (ref 0.00–0.07)
Basophils Absolute: 0.1 10*3/uL (ref 0.0–0.1)
Basophils Relative: 1 %
Eosinophils Absolute: 0.2 10*3/uL (ref 0.0–0.5)
Eosinophils Relative: 3 %
HCT: 43.2 % (ref 36.0–46.0)
Hemoglobin: 14.5 g/dL (ref 12.0–15.0)
Immature Granulocytes: 0 %
Lymphocytes Relative: 19 %
Lymphs Abs: 1.2 10*3/uL (ref 0.7–4.0)
MCH: 29.7 pg (ref 26.0–34.0)
MCHC: 33.6 g/dL (ref 30.0–36.0)
MCV: 88.3 fL (ref 80.0–100.0)
Monocytes Absolute: 0.8 10*3/uL (ref 0.1–1.0)
Monocytes Relative: 12 %
Neutro Abs: 4.3 10*3/uL (ref 1.7–7.7)
Neutrophils Relative %: 65 %
Platelet Count: 510 10*3/uL — ABNORMAL HIGH (ref 150–400)
RBC: 4.89 MIL/uL (ref 3.87–5.11)
RDW: 13.8 % (ref 11.5–15.5)
WBC Count: 6.5 10*3/uL (ref 4.0–10.5)
nRBC: 0 % (ref 0.0–0.2)

## 2023-02-06 ENCOUNTER — Other Ambulatory Visit: Payer: Self-pay | Admitting: Family Medicine

## 2023-02-06 DIAGNOSIS — E039 Hypothyroidism, unspecified: Secondary | ICD-10-CM

## 2023-02-07 ENCOUNTER — Telehealth: Payer: Self-pay | Admitting: *Deleted

## 2023-02-07 ENCOUNTER — Encounter: Payer: Self-pay | Admitting: Genetic Counselor

## 2023-02-07 NOTE — Telephone Encounter (Signed)
RN placed call to pt with the below recommendations per NP request.  Pt educated and verbalized understanding.

## 2023-02-07 NOTE — Telephone Encounter (Signed)
-----   Message from Loa Socks, NP sent at 02/07/2023  9:03 AM EDT ----- Potassium is slightly low, she should eat potassium rich foods and f/u with her pcp for further instruction.    Please let her know I am waiting to hear from the radiologist, and let her know I haven't forgotten!  Thanks, LC ----- Message ----- From: Leory Plowman, [facility] [place] Sent: 02/05/2023   2:44 PM EDT To: Loa Socks, NP

## 2023-02-08 DIAGNOSIS — Z8551 Personal history of malignant neoplasm of bladder: Secondary | ICD-10-CM | POA: Diagnosis not present

## 2023-02-11 ENCOUNTER — Other Ambulatory Visit: Payer: Self-pay | Admitting: Family Medicine

## 2023-02-11 DIAGNOSIS — F419 Anxiety disorder, unspecified: Secondary | ICD-10-CM

## 2023-02-11 NOTE — Progress Notes (Signed)
[facility] Cancer Follow up:    [name], [name], MD [address] [place] [address]   DIAGNOSIS:  Cancer Staging  Breast cancer, right breast [name]) Staging form: Breast, [name] 7th Edition - Clinical: Stage IA (T1c, N0, M0) - Signed by Lowella Dell, MD on 02/17/2015   SUMMARY OF ONCOLOGIC HISTORY: Per Dr. [name] note from 06/21/2017:  [name] woman: history of right breast cancer: pT1c pN0, stage IA, estrogen and progesterone receptor positive.   1. S/p right mastectomy with myocutaneous flap 09/19/2009   2. Adjuvant chemo with 4 cycles of  Taxotere/ Cytoxan   3. Femara, followed by Arimidex, started roughly Jan 2011, with a few months' interruption   4.  Completed 5 years of antiestrogen therapy June 2016   5.  Also s/p left breast lumpectomy in may 2014 ,negative for atypia or malignancy   6. Intermittent thrombocytosis, mild likely multifocal factorial (borderline iron deficiency, positive ANA and elevated C-reactive protein)    CURRENT THERAPY: observation  INTERVAL HISTORY: [name] 76 y.o. female returns for f/u after undergoing mammogram guided biopsy earlier this month. She notes since the procedure she has developed some areas of hard spots within her breast that have her concerned.  She is also worried about the results and what they mean for her.  She denies any pain in her breast.  She is otherwise feeling well today.    Patient Active Problem List   Diagnosis Date Noted   Malignant neoplasm of urinary bladder, unspecified site (HCC) 12/25/2022   Spondylolisthesis of lumbar region 10/22/2022   Chronic back pain 11/04/2021   Systolic and diastolic CHF, chronic (HCC) 11/04/2021   PVD (peripheral vascular disease) (HCC) 11/04/2021   OSA on CPAP 11/04/2021   Chronic diarrhea 11/04/2021   GERD (gastroesophageal reflux disease) 11/04/2021   CAP (community acquired pneumonia) 08/01/2021   Sepsis due to pneumonia (HCC) 07/31/2021    Systolic CHF, chronic (HCC) 07/31/2021   Prediabetes 07/19/2021   Hemoptysis 10/05/2020   Hypokalemia 09/26/2020   OSA (obstructive sleep apnea) 09/11/2020   SOB (shortness of breath)    Acute respiratory failure with hypoxia (HCC) 08/14/2020   Esophageal dysmotility 08/14/2020   Atherosclerosis of aorta (HCC) 08/14/2020   Snoring 06/26/2020   Shortness of breath    Acute systolic CHF (congestive heart failure) (HCC) 05/21/2020   COPD exacerbation (HCC) 05/20/2020   Acute on chronic respiratory failure with hypoxia (HCC) 05/20/2020   Genetic testing 12/12/2018   Family history of breast cancer    Family history of ovarian cancer    Family history of pancreatic cancer    Family history of colon cancer    Family history of melanoma    History of breast cancer    Chronic respiratory failure with hypoxia (HCC) 10/14/2017   COPD with acute exacerbation (HCC) 10/10/2017   Flu vaccine need 09/09/2017   Anxiety disorder 09/09/2017   Fibromyalgia 09/09/2017   Essential (hemorrhagic) thrombocythemia (HCC) 06/21/2017   Malignant neoplasm of overlapping sites of right breast in female, estrogen receptor positive (HCC) 06/05/2017   Dermatitis 04/02/2017   Edema of right lower extremity 02/26/2017   Positive ANA (antinuclear antibody) 02/08/2017   Elevated IgE level and eosinophilia 12/31/2016   Rash and nonspecific skin eruption 11/28/2016   Allergic rhinitis 11/28/2016   History of bacterial pneumonia 08/09/2016   Routine general medical examination at a health care facility 08/12/2015   Insomnia 03/04/2015   Breast cancer, right breast (HCC) 02/17/2015  CAD (coronary artery disease) 07/11/2011   Tobacco abuse 03/27/2011   Chronic mesenteric ischemia (HCC) 03/27/2011   Solitary pulmonary nodule 03/27/2011   PAD (peripheral artery disease) (HCC) 03/04/2011   Arthropathy 11/09/2010   Hypothyroidism 09/20/2009   Hyperlipidemia 09/20/2009   Hearing loss 09/14/2008   Essential  hypertension 09/08/2007   COPD, frequent exacerbations (HCC) 09/08/2007   GERD 09/08/2007    is allergic to sinequan [doxepin], sulfa antibiotics, and sulfur.  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)    05/ 2018  chronic non-occlusive DVT RLE   Chronic kidney disease    COPD, frequent exacerbations (HCC)    03-06-2018 per pt last exacerbation 12/ 2018   Coronary artery disease    cardiologist-  dr Shirlee Latch-- 03-11-2018 er cath , 80% stenosis in the ostial second diagonal   Dyspnea on minimal exertion    Emphysema/COPD (HCC)    CAT score- 17   Family history of breast cancer    Family history of colon cancer    Family history of melanoma    Family history of ovarian cancer    Family history of pancreatic cancer    Fibromyalgia 1995   GERD (gastroesophageal reflux disease)    Hiatal hernia    History of breast cancer    History of diverticulitis of colon    2002  s/p  sigmoid colectomy   History of multiple pulmonary nodules    hx RUL nodules x2  s/p right VATS w/ wedge resection 09-11-2005 and 06-14-2011  both  necrotizing granulomatous inflammation w/ cystic area of necrosis & focal calcification   History of right breast cancer oncologist-  dr [name]-- no recurrence   dx 2010--  IDC, Stage IA , ER/PR+,  (pT1c pN0) 11-09-2008 right lumpectomy;  12-18-2008 right simple mastectomy for DCIS margins;  completed chemotherapy 2010 (no radiation) and completed antiestrogen therapy   Hx of colonic polyps    Dr. 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   Nocturia    OA (osteoarthritis)    thumbs   On supplemental oxygen therapy    03-06-2018 per pt uses only at night,  checks O2 sats at  home,  stated am sat 89% after moving around average 93-94% with RA   Osteoporosis    Peripheral arterial occlusive disease (HCC) vascular-- dr chen/ dr Randie Heinz   proximal right SFA severe focal stenosis with collaterals from the PFA/  04/ 2012 occluded celiac and SMA arteries with distal reconstitution w/ patent IMA   Peripheral vascular disease (HCC)    chronic DVT RLE,  mesenteric vascular disease   Pneumonia    Right lower lobe pulmonary nodule    Chest CT 08-29-2017   Sleep apnea    Stage 3 severe COPD by GOLD classification (HCC) hx frequent exacerbations--   pulmologist-  dr Inocente Salles--  per lov note , dated 12-20-2017, oxyogen 2L is prescribed for use with exertion (but pt only uses mostly at night), O2 sats on RA run the 80s , this day sat 86% RA and with 2L O2 sat 92%   Varicose veins of leg with swelling    varicose vein surgery -  Dr. Guss Bunde   Wears glasses    Wears hearing aid in both ears     SURGICAL HISTORY: Past Surgical History:  Procedure Laterality Date   ABDOMINAL AORTOGRAM N/A 07/11/2020   Procedure: ABDOMINAL AORTOGRAM;  Surgeon: Maeola Harman, MD;  Location: [facility] INVASIVE CV LAB;  Service: Cardiovascular;  Laterality: N/A;   AORTIC ARCH ANGIOGRAPHY N/A 07/11/2020   Procedure: AORTIC ARCH ANGIOGRAPHY;  Surgeon: Maeola Harman, MD;  Location: [facility] INVASIVE CV LAB;  Service: Cardiovascular;  Laterality: N/A;   BREAST EXCISIONAL BIOPSY Left 10/15/2008   BREAST LUMPECTOMY W/ NEEDLE LOCALIZATION Right 11-09-2008    dr cornett   [facility]   CARDIAC CATHETERIZATION  03-12-2011   dr Shirlee Latch   70-80% ostial stenosis in small second diagonal (appears to small for intervention),  dLAD 40-50%,  minimal luminal irregulartieis involoving LCFx and RCA,  LVEF 55%   CATARACT EXTRACTION W/ INTRAOCULAR LENS  IMPLANT, BILATERAL  2011   CYSTOSCOPY N/A 03/11/2018   Procedure: CYSTOSCOPY WITH INSTILLATION OF POST OPERATIVE EPIRUBICIN;  Surgeon: Jerilee Field, MD;   Location: [facility] ORS;  Service: Urology;  Laterality: N/A;   LEFT HEART CATH AND CORONARY ANGIOGRAPHY N/A 05/25/2020   Procedure: LEFT HEART CATH AND CORONARY ANGIOGRAPHY;  Surgeon: Lennette Bihari, MD;  Location: [facility] INVASIVE CV LAB;  Service: Cardiovascular;  Laterality: N/A;   MASTECTOMY Right    PARTIAL COLECTOMY  2002   sigmoid and appectomy (diverticulitis)   PARTIAL MASTECTOMY WITH NEEDLE LOCALIZATION Left 02/23/2013   Procedure:  LEFT PARTIAL MASTECTOMY WITH NEEDLE LOCALIZATION;  Surgeon: [name], MD;  Location: [facility];  Service: General;  Laterality: Left;   PERIPHERAL VASCULAR INTERVENTION  07/11/2020   Procedure: PERIPHERAL VASCULAR INTERVENTION;  Surgeon: Maeola Harman, MD;  Location: [facility] INVASIVE CV LAB;  Service: Cardiovascular;;   RECONSTRUCTION BREAST W/ LATISSIMUS DORSI FLAP Right 05-30-2009   dr Odis Luster  [facility]   REDUCTION MAMMAPLASTY Left    SIMPLE MASTECTOMY Right 12-28-2008    dr Luisa Hart  [facility]   TRANSFORAMINAL LUMBAR INTERBODY FUSION W/ MIS 1 LEVEL Left 10/22/2022   Procedure: LUMBAR THREE-FOUR MINIMALLY INVASIVE TRANSFORAMINAL LUMBAR INTERBODY FUSION;  Surgeon: Jadene Pierini, MD;  Location: [facility] OR;  Service: Neurosurgery;  Laterality: Left;  3C/RM 21 to follow   TRANSTHORACIC ECHOCARDIOGRAM  07-09-2016  dr Shirlee Latch   ef 55-60%, grade 1 diastoic dysfunction/  mild TR   TRANSURETHRAL RESECTION OF BLADDER TUMOR N/A 03/11/2018   Procedure: TRANSURETHRAL RESECTION OF BLADDER TUMOR (TURBT) 2-5cm;  Surgeon: Jerilee Field, MD;  Location: [facility] ORS;  Service: Urology;  Laterality: N/A;   UPPER EXTREMITY ANGIOGRAPHY Left 07/11/2020   Procedure: Upper Extremity Angiography;  Surgeon: Maeola Harman, MD;  Location: [facility] INVASIVE CV LAB;  Service: Cardiovascular;  Laterality: Left;   VAGINAL HYSTERECTOMY  1973   VIDEO ASSISTED THORACOSCOPY (VATS)/WEDGE RESECTION Right 09-11-2005  &  06-14-2011   dr Sloan Leiter  [facility] [place] [facility]   both RUL    SOCIAL  HISTORY: Social History   Socioeconomic History   Marital status: Married    Spouse name: Not on file   Number of children: 3   Years of education: 12   Highest education level: Not on file  Occupational History   Occupation: Magazine features editor: RETIRED    Comment: retired  Tobacco Use   Smoking status: Former    Packs/day: 0.25    Years: 50.00    Additional pack years: 0.00    Total pack years: 12.50  Types: Cigarettes    Quit date: 05/18/2016    Years since quitting: 6.7   Smokeless tobacco: Never  Vaping Use   Vaping Use: Never used  Substance and Sexual Activity   Alcohol use: Yes    Alcohol/week: 14.0 standard drinks of alcohol    Types: 14 Cans of beer per week    Comment: every night   Drug use: No   Sexual activity: Not on file  Other Topics Concern   Not on file  Social History Narrative   HSG, beauty school. Married '69 -7 yrs/widowed; married '79 - 36yr/divorced; married '87.  2 sons - '70, '74, 1 srtep-son; 1 grandchild. 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 SO - good health.  NO history of abuse.  ACP - full code and all heroic measures.    Social Determinants of Health   Financial Resource Strain: Low Risk  (12/21/2020)   Overall Financial Resource Strain (CARDIA)    Difficulty of Paying Living Expenses: Not hard at all  Food Insecurity: No Food Insecurity (12/21/2020)   Hunger Vital Sign    Worried About Running Out of Food in the Last Year: Never true    Ran Out of Food in the Last Year: Never true  Transportation Needs: No Transportation Needs (12/21/2020)   PRAPARE - Administrator, Civil Service (Medical): No    Lack of Transportation (Non-Medical): No  Physical Activity: Inactive (12/21/2020)   Exercise Vital Sign    Days of Exercise per Week: 0 days    Minutes of Exercise per Session: 0 min  Stress: No Stress Concern Present (12/21/2020)   [employer] of Occupational Health - Occupational Stress Questionnaire    Feeling of Stress :  Not at all  Social Connections: Socially Isolated (12/21/2020)   Social Connection and Isolation Panel [NHANES]    Frequency of Communication with Friends and Family: Once a week    Frequency of Social Gatherings with Friends and Family: Never    Attends Religious Services: Never    Database administrator or Organizations: No    Attends Banker Meetings: Never    Marital Status: Married  Catering manager Violence: Not At Risk (12/21/2020)   Humiliation, Afraid, Rape, and Kick questionnaire    Fear of Current or Ex-Partner: No    Emotionally Abused: No    Physically Abused: No    Sexually Abused: No    FAMILY HISTORY: Family History  Problem Relation Age of Onset   Colon cancer Mother        colon   COPD Mother        brown lung   Hypertension Mother    Diabetes Mother    Emphysema Mother    Coronary artery disease Father    Heart attack Father    Sudden death Father    Heart disease Father    Cancer Sister        throat   Hypertension Sister    Coronary artery disease Brother    Heart attack Brother        early 48s   Throat cancer Sister    Ovarian cancer Sister        fallopian tube cancer in her 1s   Melanoma Sister    Hypertension Sister    Pancreatic cancer Sister    Melanoma Niece        dx in her 31s   Breast cancer Niece 53    Review of Systems  Constitutional:  Negative for appetite change, chills, fatigue, fever and unexpected weight change.  HENT:   Negative for hearing loss, lump/mass and trouble swallowing.   Eyes:  Negative for eye problems and icterus.  Respiratory:  Negative for chest tightness, cough and shortness of breath.   Cardiovascular:  Negative for chest pain, leg swelling and palpitations.  Gastrointestinal:  Negative for abdominal distention, abdominal pain, constipation, diarrhea, nausea and vomiting.  Endocrine: Negative for hot flashes.  Genitourinary:  Negative for difficulty urinating.   Musculoskeletal:  Negative for  arthralgias.  Skin:  Negative for itching and rash.  Neurological:  Negative for dizziness, extremity weakness, headaches and numbness.  Hematological:  Negative for adenopathy. Does not bruise/bleed easily.  Psychiatric/Behavioral:  Negative for depression. The patient is not nervous/anxious.       PHYSICAL EXAMINATION    Vitals:   02/05/23 1446  BP: 133/71  Pulse: 100  Resp: 16  Temp: (!) 96.8 F (36 C)    Physical Exam Constitutional:      General: She is not in acute distress.    Appearance: Normal appearance. She is not toxic-appearing.  HENT:     Head: Normocephalic and atraumatic.  Eyes:     General: No scleral icterus. Cardiovascular:     Rate and Rhythm: Normal rate and regular rhythm.     Pulses: Normal pulses.     Heart sounds: Normal heart sounds.  Pulmonary:     Effort: Pulmonary effort is normal.     Breath sounds: Normal breath sounds.  Chest:     Comments: Left breast s/p biopsy, there are a couple of areas of nodularity present, these appear to be small hematomas related to the biopsy Abdominal:     General: Abdomen is flat. Bowel sounds are normal. There is no distension.     Palpations: Abdomen is soft.     Tenderness: There is no abdominal tenderness.  Musculoskeletal:        General: No swelling.     Cervical back: Neck supple.  Lymphadenopathy:     Cervical: No cervical adenopathy.  Skin:    General: Skin is warm and dry.     Findings: No rash.  Neurological:     General: No focal deficit present.     Mental Status: She is alert.  Psychiatric:        Mood and Affect: Mood normal.        Behavior: Behavior normal.     LABORATORY DATA:  CBC    Component Value Date/Time   WBC 6.5 02/05/2023 1433   WBC 7.2 10/18/2022 1400   RBC 4.89 02/05/2023 1433   HGB 14.5 02/05/2023 1433   HGB 15.3 06/08/2020 1510   HGB 13.7  1045   HCT 43.2 02/05/2023 1433   HCT 44.5 06/08/2020 1510   HCT 42.4  1045   PLT 510 (H)  02/05/2023 1433   PLT 322 06/08/2020 1510   MCV 88.3 02/05/2023 1433   MCV 90 06/08/2020 1510   MCV 87.8  1045   MCH 29.7 02/05/2023 1433   MCHC 33.6 02/05/2023 1433   RDW 13.8 02/05/2023 1433   RDW 13.0 06/08/2020 1510   RDW 14.9 (H)  1045   LYMPHSABS 1.2 02/05/2023 1433   LYMPHSABS 1.1 01/06/2020 1732   LYMPHSABS 1.1  1045   MONOABS 0.8 02/05/2023 1433   MONOABS 0.7  1045   EOSABS 0.2 02/05/2023 1433   EOSABS 0.5 (H) 01/06/2020 1732   BASOSABS 0.1 02/05/2023 1433  BASOSABS 0.1 01/06/2020 1732   BASOSABS 0.0  1045    CMP     Component Value Date/Time   NA 140 02/05/2023 1433   NA 138 06/08/2020 1510   NA 141 02/17/2015 1249   K 3.2 (L) 02/05/2023 1433   K 4.3 02/17/2015 1249   CL 100 02/05/2023 1433   CL 108 (H) 08/21/2012 1501   CO2 30 02/05/2023 1433   CO2 25 02/17/2015 1249   GLUCOSE 139 (H) 02/05/2023 1433   GLUCOSE 85 02/17/2015 1249   GLUCOSE 101 (H) 08/21/2012 1501   BUN 17 02/05/2023 1433   BUN 13 06/08/2020 1510   BUN 16.6 02/17/2015 1249   CREATININE 0.87 02/05/2023 1433   CREATININE 0.95 (H) 09/26/2020 1558   CREATININE 0.8 02/17/2015 1249   CALCIUM 10.2 02/05/2023 1433   CALCIUM 9.3 02/17/2015 1249   PROT 8.3 (H) 02/05/2023 1433   PROT 7.6 06/04/2018 1200   PROT 7.2 02/17/2015 1249   ALBUMIN 4.4 02/05/2023 1433   ALBUMIN 4.6 06/04/2018 1200   ALBUMIN 3.9 02/17/2015 1249   AST 24 02/05/2023 1433   AST 17 02/17/2015 1249   ALT 21 02/05/2023 1433   ALT 23 02/17/2015 1249   ALKPHOS 110 02/05/2023 1433   ALKPHOS 77 02/17/2015 1249   BILITOT 0.5 02/05/2023 1433   BILITOT 0.38 02/17/2015 1249   GFRNONAA >60 02/05/2023 1433   GFRNONAA 59 (L) 09/26/2020 1558   GFRAA 68 09/26/2020 1558          ASSESSMENT and THERAPY PLAN:   Malignant neoplasm of urinary bladder, unspecified site [facility]) History of right breast cancer in 2010 s/p mastectomy, adjuvant chemotherapy, and antiestrogen therapy that  she completed in 2016.   Left breast abnormality after biopsy: this is likely hematoma from her biopsy. I will reach out to Dr. Juanetta Gosling at [facility] to see if they can get her back in for further evaluation and f/u.    All questions were answered. The patient knows to call the clinic with any problems, questions or concerns. We can certainly see the patient much sooner if necessary.  Total encounter time:15 minutes*in face-to-face visit time, chart review, lab review, care coordination, order entry, and documentation of the encounter time.    Lillard Anes, NP 02/11/23 5:45 PM Medical Oncology and Hematology [facility] [address] [place], [place] [address] Tel. [phone]    Fax. [phone]  *Total Encounter Time as defined by the Centers for Medicare and Medicaid Services includes, in addition to the face-to-face time of a patient visit (documented in the note above) non-face-to-face time: obtaining and reviewing outside history, ordering and reviewing medications, tests or procedures, care coordination (communications with other health care professionals or caregivers) and documentation in the medical record.

## 2023-02-11 NOTE — Assessment & Plan Note (Signed)
History of right breast cancer in 2010 s/p mastectomy, adjuvant chemotherapy, and antiestrogen therapy that she completed in 2016.   Left breast abnormality after biopsy: this is likely hematoma from her biopsy. I will reach out to Dr. Juanetta Gosling at [facility] to see if they can get her back in for further evaluation and f/u.

## 2023-02-20 ENCOUNTER — Encounter: Payer: Self-pay | Admitting: Family Medicine

## 2023-02-20 ENCOUNTER — Telehealth: Payer: Self-pay | Admitting: Family Medicine

## 2023-02-20 NOTE — Telephone Encounter (Signed)
Contacted [name] to schedule their annual wellness visit. Appointment made for 02/25/23.  [name] AWV direct phone # [phone]

## 2023-02-22 ENCOUNTER — Telehealth: Payer: Self-pay | Admitting: Family Medicine

## 2023-02-22 DIAGNOSIS — E039 Hypothyroidism, unspecified: Secondary | ICD-10-CM

## 2023-02-22 MED ORDER — LEVOTHYROXINE SODIUM 112 MCG PO TABS
ORAL_TABLET | ORAL | 2 refills | Status: DC
Start: 2023-02-22 — End: 2023-04-09

## 2023-02-22 NOTE — Telephone Encounter (Signed)
[facility] called to request a refill for the:  levothyroxine (SYNTHROID) 112 MCG tablet  They are asking for Rx to be escribed or faxed   [facility]

## 2023-02-22 NOTE — Telephone Encounter (Signed)
Rx sent 

## 2023-02-25 ENCOUNTER — Ambulatory Visit: Payer: 59 | Admitting: Internal Medicine

## 2023-02-25 ENCOUNTER — Ambulatory Visit (INDEPENDENT_AMBULATORY_CARE_PROVIDER_SITE_OTHER): Payer: 59

## 2023-02-25 VITALS — Ht 65.0 in | Wt 116.0 lb

## 2023-02-25 DIAGNOSIS — Z79899 Other long term (current) drug therapy: Secondary | ICD-10-CM | POA: Diagnosis not present

## 2023-02-25 DIAGNOSIS — L2084 Intrinsic (allergic) eczema: Secondary | ICD-10-CM | POA: Diagnosis not present

## 2023-02-25 DIAGNOSIS — Z0000 Encounter for general adult medical examination without abnormal findings: Secondary | ICD-10-CM | POA: Diagnosis not present

## 2023-02-25 DIAGNOSIS — L219 Seborrheic dermatitis, unspecified: Secondary | ICD-10-CM | POA: Diagnosis not present

## 2023-02-25 NOTE — Progress Notes (Signed)
Subjective:   [name] is a 77 y.o. female who presents for Medicare Annual (Subsequent) preventive examination.  Review of Systems   Virtual Visit via Telephone Note  I connected with  Rhett Bannister on 02/25/23 at  9:15 AM EDT by telephone and verified that I am speaking with the correct person using two identifiers.  Location: Patient: Home Provider: Office Persons participating in the virtual visit: patient/Nurse Health Advisor   I discussed the limitations, risks, security and privacy concerns of performing an evaluation and management service by telephone and the availability of in person appointments. The patient expressed understanding and agreed to proceed.  Interactive audio and video telecommunications were attempted between this nurse and patient, however failed, due to patient having technical difficulties OR patient did not have access to video capability.  We continued and completed visit with audio only.  Some vital signs may be absent or patient reported.   Tillie Rung, LPN  Cardiac Risk Factors include: advanced age (>101men, >3 women);hypertension     Objective:    Today's Vitals   02/25/23 0918  Weight: 116 lb (52.6 kg)  Height: 5\' 5"  (1.651 m)   Body mass index is 19.3 kg/m.     02/25/2023    9:26 AM 10/18/2022    1:14 PM 01/08/2022    2:09 PM 11/01/2021   10:31 PM 09/05/2021   10:35 AM 07/31/2021    3:45 PM 07/31/2021   10:23 AM  Advanced Directives  Does Patient Have a Medical Advance Directive? Yes Yes Yes No No Yes Yes  Type of Estate agent of [place];Living will Healthcare Power of [place];Living will Healthcare Power of [place];Living will   Healthcare Power of [place];Living will Healthcare Power of [place];Living will  Does patient want to make changes to medical advance directive?      No - Patient declined   Copy of Healthcare Power of Attorney in Chart? No - copy requested No - copy requested No - copy  requested   No - copy requested   Would patient like information on creating a medical advance directive?    No - Patient declined       Current Medications (verified) Outpatient Encounter Medications as of 02/25/2023  Medication Sig   albuterol (VENTOLIN HFA) 108 (90 Base) MCG/ACT inhaler TAKE 2 PUFFS BY MOUTH EVERY 6 HOURS AS NEEDED FOR WHEEZE OR SHORTNESS OF BREATH   aspirin EC 81 MG tablet Take 1 tablet (81 mg total) by mouth daily.   atorvastatin (LIPITOR) 80 MG tablet Take 1 tablet (80 mg total) by mouth daily.   budesonide (PULMICORT) 0.5 MG/2ML nebulizer solution Take 2 mLs (0.5 mg total) by nebulization 2 (two) times daily.   cilostazol (PLETAL) 100 MG tablet Take 1 tablet (100 mg total) by mouth 2 (two) times daily.   clopidogrel (PLAVIX) 75 MG tablet Take 1 tablet (75 mg total) by mouth daily.   cyanocobalamin (VITAMIN B12) 1000 MCG/ML injection Inject 1,000 mcg into the muscle every 30 (thirty) days.   cyclobenzaprine (FLEXERIL) 10 MG tablet Take 1 tablet (10 mg total) by mouth 3 (three) times daily as needed for muscle spasms.   DUPIXENT 300 MG/2ML SOPN Inject 300 mg into the skin every 14 (fourteen) days.   ezetimibe (ZETIA) 10 MG tablet Take 1 tablet (10 mg total) by mouth daily.   ferrous sulfate (FEROSUL) 325 (65 FE) MG tablet TAKE ONE TABLET BY MOUTH DAILY AT 9AM WITH BREAKFAST   fluticasone (FLONASE)  50 MCG/ACT nasal spray SPRAY 1 SPRAY IN EACH NOSTRIL TWICE DAILY (BULK)   Fluticasone-Umeclidin-Vilant (TRELEGY ELLIPTA) 200-62.5-25 MCG/ACT AEPB TAKE 1 PUFF BY MOUTH EVERY DAY   furosemide (LASIX) 40 MG tablet TAKE 1 TABLET BY MOUTH TWICE  DAILY   ipratropium-albuterol (DUONEB) 0.5-2.5 (3) MG/3ML SOLN Inhale 3 mLs into the lungs as directed. TID for 1 week and then use as needed Q6 hours.   levothyroxine (SYNTHROID) 112 MCG tablet TAKE ONE-HALF TABLET BY MOUTH ON TUESDAY AND THURSDAY . TAKE 1  TABLET BY MOUTH ON MONDAY,  WEDNESDAY, AND FRIDAY , SATURDAY , AND SUNDAY   LORazepam  (ATIVAN) 2 MG tablet TAKE ONE TABLET BY MOUTH DAILY AT 9PM AT BEDTIME   montelukast (SINGULAIR) 10 MG tablet TAKE 1 TABLET BY MOUTH EVERYDAY AT BEDTIME   pantoprazole (PROTONIX) 20 MG tablet Take 1 tablet (20 mg total) by mouth 2 (two) times daily before a meal.   PARoxetine (PAXIL) 20 MG tablet TAKE 1 TABLET BY MOUTH EVERY DAY   potassium chloride SA (KLOR-CON M) 20 MEQ tablet TAKE 1 TABLET BY MOUTH DAILY   tamsulosin (FLOMAX) 0.4 MG CAPS capsule TAKE ONE CAPSULE (0.4 MG TOTAL) BY MOUTH DAILY AT 9AM   traZODone (DESYREL) 100 MG tablet TAKE ONE TABLET BY MOUTH DAILY AT 9PM AT BEDTIME   No facility-administered encounter medications on file as of 02/25/2023.    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)    05/ 2018  chronic non-occlusive DVT RLE   Chronic kidney disease    COPD, frequent exacerbations (HCC)    03-06-2018 per pt last exacerbation 12/ 2018   Coronary artery disease    cardiologist-  dr Shirlee Latch-- 03-11-2018 er cath , 80% stenosis in the ostial second diagonal   Dyspnea on minimal exertion    Emphysema/COPD (HCC)    CAT score- 17   Family history of breast cancer    Family history of colon cancer    Family history of melanoma    Family history of ovarian cancer    Family history of pancreatic cancer    Fibromyalgia 1995   GERD (gastroesophageal reflux disease)    Hiatal hernia    History of breast cancer    History of diverticulitis of colon    2002  s/p  sigmoid colectomy   History of multiple pulmonary nodules    hx RUL nodules x2  s/p right VATS w/ wedge resection 09-11-2005 and 06-14-2011  both  necrotizing granulomatous inflammation w/ cystic area of necrosis & focal calcification   History of right breast cancer  oncologist-  dr [name]-- no recurrence   dx 2010--  IDC, Stage IA , ER/PR+,  (pT1c pN0) 11-09-2008 right lumpectomy;  12-18-2008 right simple mastectomy for DCIS margins;  completed chemotherapy 2010 (no radiation) and completed antiestrogen therapy   Hx of colonic polyps    Dr. 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   Nocturia    OA (osteoarthritis)    thumbs   On supplemental oxygen therapy    03-06-2018 per pt uses only at night,  checks O2 sats  at home,  stated am sat 89% after moving around average 93-94% with RA   Osteoporosis    Peripheral arterial occlusive disease (HCC) vascular-- dr chen/ dr Randie Heinz   proximal right SFA severe focal stenosis with collaterals from the PFA/  04/ 2012 occluded celiac and SMA arteries with distal reconstitution w/ patent IMA   Peripheral vascular disease (HCC)    chronic DVT RLE,  mesenteric vascular disease   Pneumonia    Right lower lobe pulmonary nodule    Chest CT 08-29-2017   Sleep apnea    Stage 3 severe COPD by GOLD classification (HCC) hx frequent exacerbations--   pulmologist-  dr Inocente Salles--  per lov note , dated 12-20-2017, oxyogen 2L is prescribed for use with exertion (but pt only uses mostly at night), O2 sats on RA run the 80s , this day sat 86% RA and with 2L O2 sat 92%   Varicose veins of leg with swelling    varicose vein surgery - Dr. Guss Bunde   Wears glasses    Wears hearing aid in both ears    Past Surgical History:  Procedure Laterality Date   ABDOMINAL AORTOGRAM N/A 07/11/2020   Procedure: ABDOMINAL AORTOGRAM;  Surgeon: Maeola Harman, MD;  Location: [facility] INVASIVE CV LAB;  Service: Cardiovascular;  Laterality: N/A;   AORTIC ARCH ANGIOGRAPHY N/A 07/11/2020   Procedure: AORTIC ARCH ANGIOGRAPHY;  Surgeon: Maeola Harman, MD;  Location: [facility] INVASIVE CV LAB;  Service: Cardiovascular;  Laterality:  N/A;   BREAST EXCISIONAL BIOPSY Left 10/15/2008   BREAST LUMPECTOMY W/ NEEDLE LOCALIZATION Right 11-09-2008    dr cornett   [facility]   CARDIAC CATHETERIZATION  03-12-2011   dr Shirlee Latch   70-80% ostial stenosis in small second diagonal (appears to small for intervention),  dLAD 40-50%,  minimal luminal irregulartieis involoving LCFx and RCA,  LVEF 55%   CATARACT EXTRACTION W/ INTRAOCULAR LENS  IMPLANT, BILATERAL  2011   CYSTOSCOPY N/A 03/11/2018   Procedure: CYSTOSCOPY WITH INSTILLATION OF POST OPERATIVE EPIRUBICIN;  Surgeon: Jerilee Field, MD;  Location: [facility] ORS;  Service: Urology;  Laterality: N/A;   LEFT HEART CATH AND CORONARY ANGIOGRAPHY N/A 05/25/2020   Procedure: LEFT HEART CATH AND CORONARY ANGIOGRAPHY;  Surgeon: Lennette Bihari, MD;  Location: [facility] INVASIVE CV LAB;  Service: Cardiovascular;  Laterality: N/A;   MASTECTOMY Right    PARTIAL COLECTOMY  2002   sigmoid and appectomy (diverticulitis)   PARTIAL MASTECTOMY WITH NEEDLE LOCALIZATION Left 02/23/2013   Procedure:  LEFT PARTIAL MASTECTOMY WITH NEEDLE LOCALIZATION;  Surgeon: [name], MD;  Location: [facility];  Service: General;  Laterality: Left;   PERIPHERAL VASCULAR INTERVENTION  07/11/2020   Procedure: PERIPHERAL VASCULAR INTERVENTION;  Surgeon: Maeola Harman, MD;  Location[facility] INVASIVE CV LAB;  Service: Cardiovascular;;   RECONSTRUCTION BREAST W/ LATISSIMUS DORSI FLAP Right 05-30-2009   dr Odis Luster  [facility]   REDUCTION MAMMAPLASTY Left    SIMPLE MASTECTOMY Right 12-28-2008    dr Luisa Hart  [facility]   TRANSFORAMINAL LUMBAR INTERBODY FUSION W/ MIS 1 LEVEL Left 10/22/2022   Procedure: LUMBAR THREE-FOUR MINIMALLY INVASIVE TRANSFORAMINAL LUMBAR INTERBODY FUSION;  Surgeon: Jadene Pierini, MD;  Location: [facility] OR;  Service: Neurosurgery;  Laterality: Left;  3C/RM 21 to follow   TRANSTHORACIC ECHOCARDIOGRAM  07-09-2016  dr Shirlee Latch   ef 55-60%, grade 1 diastoic dysfunction/  mild TR   TRANSURETHRAL RESECTION OF BLADDER  TUMOR N/A 03/11/2018   Procedure: TRANSURETHRAL RESECTION OF BLADDER TUMOR (TURBT) 2-5cm;  Surgeon:  Jerilee Field, MD;  Location: [facility];  Service: Urology;  Laterality: N/A;   UPPER EXTREMITY ANGIOGRAPHY Left 07/11/2020   Procedure: Upper Extremity Angiography;  Surgeon: Maeola Harman, MD;  Location: [facility] INVASIVE CV LAB;  Service: Cardiovascular;  Laterality: Left;   VAGINAL HYSTERECTOMY  1973   VIDEO ASSISTED THORACOSCOPY (VATS)/WEDGE RESECTION Right 09-11-2005  &  06-14-2011   dr hendrickso  [facility]   both RUL   Family History  Problem Relation Age of Onset   Colon cancer Mother        colon   COPD Mother        brown lung   Hypertension Mother    Diabetes Mother    Emphysema Mother    Coronary artery disease Father    Heart attack Father    Sudden death Father    Heart disease Father    Cancer Sister        throat   Hypertension Sister    Coronary artery disease Brother    Heart attack Brother        early 39s   Throat cancer Sister    Ovarian cancer Sister        fallopian tube cancer in her 6s   Melanoma Sister    Hypertension Sister    Pancreatic cancer Sister    Melanoma Niece        dx in her 31s   Breast cancer Niece 40   Social History   Socioeconomic History   Marital status: Married    Spouse name: Not on file   Number of children: 3   Years of education: 12   Highest education level: Not on file  Occupational History   Occupation: Magazine features editor: RETIRED    Comment: retired  Tobacco Use   Smoking status: Former    Packs/day: 0.25    Years: 50.00    Additional pack years: 0.00    Total pack years: 12.50    Types: Cigarettes    Quit date: 05/18/2016    Years since quitting: 6.7   Smokeless tobacco: Never  Vaping Use   Vaping Use: Never used  Substance and Sexual Activity   Alcohol use: Yes    Alcohol/week: 14.0 standard drinks of alcohol    Types: 14 Cans of beer per week    Comment: every night   Drug use: No   Sexual  activity: Not on file  Other Topics Concern   Not on file  Social History Narrative   HSG, beauty school. Married '69 -7 yrs/widowed; married '79 - 28yr/divorced; married '87.  2 sons - '70, '74, 1 srtep-son; 1 grandchild. 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 SO - good health.  NO history of abuse.  ACP - full code and all heroic measures.    Social Determinants of Health   Financial Resource Strain: Low Risk  (02/25/2023)   Overall Financial Resource Strain (CARDIA)    Difficulty of Paying Living Expenses: Not hard at all  Food Insecurity: No Food Insecurity (02/25/2023)   Hunger Vital Sign    Worried About Running Out of Food in the Last Year: Never true    Ran Out of Food in the Last Year: Never true  Transportation Needs: No Transportation Needs (02/25/2023)   PRAPARE - Administrator, Civil Service (Medical): No    Lack of Transportation (Non-Medical): No  Physical Activity: Inactive (02/25/2023)   Exercise Vital Sign    Days of Exercise per  Week: 0 days    Minutes of Exercise per Session: 0 min  Stress: No Stress Concern Present (02/25/2023)   [employer] of Occupational Health - Occupational Stress Questionnaire    Feeling of Stress : Not at all  Social Connections: Moderately Isolated (02/25/2023)   Social Connection and Isolation Panel [NHANES]    Frequency of Communication with Friends and Family: More than three times a week    Frequency of Social Gatherings with Friends and Family: More than three times a week    Attends Religious Services: Never    Database administrator or Organizations: No    Attends Engineer, structural: Never    Marital Status: Married    Tobacco Counseling Counseling given: Not Answered   Clinical Intake:  Pre-visit preparation completed: No  Pain : No/denies pain     BMI - recorded: 1930 Nutritional Status: BMI of 19-24  Normal Nutritional Risks: None Diabetes: No  How often do you need to have someone help you  when you read instructions, pamphlets, or other written materials from your doctor or pharmacy?: 1 - Never  Diabetic?  No  Interpreter Needed?: No  Information entered by :: Theresa Mulligan LPN   Activities of Daily Living    02/25/2023    9:25 AM 10/18/2022    1:19 PM  In your present state of health, do you have any difficulty performing the following activities:  Hearing? 1   Comment Wears hearing aids   Vision? 0   Difficulty concentrating or making decisions? 0   Walking or climbing stairs? 0   Dressing or bathing? 0   Doing errands, shopping? 0 0  Preparing Food and eating ? N   Using the Toilet? N   In the past six months, have you accidently leaked urine? N   Do you have problems with loss of bowel control? N   Managing your Medications? N   Managing your Finances? N   Housekeeping or managing your Housekeeping? N     Patient Care Team: [place], [name], MD as PCP - General (Family Medicine) Meriam Sprague, MD as PCP - Cardiology (Cardiology) Loreli Slot, MD (Cardiothoracic Surgery) Axel Filler Larna Daughters, NP as Nurse Practitioner (Hematology and Oncology) Kalman Shan, MD as Consulting Physician (Pulmonary Disease)  Indicate any recent Medical Services you may have received from other than [facility] providers in the past year (date may be approximate).     Assessment:   This is a routine wellness examination for [name].  Hearing/Vision screen Hearing Screening - Comments:: Wears hearing aids Vision Screening - Comments:: Not up to date with routine eye exams/ Pending appt  Dietary issues and exercise activities discussed: Exercise limited by: orthopedic condition(s)   Goals Addressed               This Visit's Progress     Patient stated (pt-stated)        I want to walk without any pain.       Depression Screen    02/25/2023    9:24 AM 12/25/2022    1:57 PM 06/29/2022    1:59 PM 01/29/2022   12:31 PM 01/08/2022    2:10 PM  12/20/2021    2:15 PM 11/14/2021    1:57 PM  PHQ 2/9 Scores  PHQ - 2 Score 0 0 0 0 0 0 0  PHQ- 9 Score   12 2  8 3     Fall Risk    02/25/2023  9:25 AM 12/25/2022    1:57 PM 06/29/2022    1:59 PM 01/08/2022    2:09 PM 12/21/2020    1:15 PM  Fall Risk   Falls in the past year? 0 0 0 0 0  Number falls in past yr: 0 0 0  0  Injury with Fall? 0 0 0  0  Risk for fall due to : No Fall Risks Other (Comment) Impaired balance/gait Medication side effect Impaired vision  Follow up Falls prevention discussed Falls evaluation completed Falls evaluation completed;Education provided Falls evaluation completed;Education provided;Falls prevention discussed Falls prevention discussed    FALL RISK PREVENTION PERTAINING TO THE HOME:  Any stairs in or around the home? No  If so, are there any without handrails? No  Home free of loose throw rugs in walkways, pet beds, electrical cords, etc? Yes  Adequate lighting in your home to reduce risk of falls? Yes   ASSISTIVE DEVICES UTILIZED TO PREVENT FALLS:  Life alert? Yes  Use of a cane, walker or w/c? No  Grab bars in the bathroom? Yes  Shower chair or bench in shower? Yes  Elevated toilet seat or a handicapped toilet? Yes   TIMED UP AND GO:  Was the test performed? No . Audio Visit   Cognitive Function:        02/25/2023    9:27 AM 01/08/2022    2:11 PM  6CIT Screen  What Year? 0 points 4 points  What month? 3 points 0 points  What time? 0 points 0 points  Count back from 20 0 points 0 points  Months in reverse 4 points 2 points  Repeat phrase 4 points 8 points  Total Score 11 points 14 points    Immunizations Immunization History  Administered Date(s) Administered   Fluad Quad(high Dose 65+) 08/26/2019, 07/19/2021, 07/30/2022   Influenza, High Dose Seasonal PF 10/03/2017, 06/20/2018   Influenza,inj,Quad PF,6+ Mos 08/10/2014, 08/12/2015, 07/03/2016   PFIZER(Purple Top)SARS-COV-2 Vaccination 01/06/2020   Pneumococcal Conjugate-13  08/10/2014   Pneumococcal Polysaccharide-23 08/12/2015   Td 10/16/2007   Zoster, Live 11/09/2010      Flu Vaccine status: Up to date  Pneumococcal vaccine status: Up to date  Covid-19 vaccine status: Completed vaccines  Qualifies for Shingles Vaccine? Yes   Zostavax completed No   Shingrix Completed?: No.    Education has been provided regarding the importance of this vaccine. Patient has been advised to call insurance company to determine out of pocket expense if they have not yet received this vaccine. Advised may also receive vaccine at local pharmacy or Health Dept. Verbalized acceptance and understanding.  Screening Tests Health Maintenance  Topic Date Due   COVID-19 Vaccine (2 - Pfizer risk series) 03/13/2023 (Originally 01/27/2020)   Zoster Vaccines- Shingrix (1 of 2) 10/18/2023 (Originally 11/14/1964)   INFLUENZA VACCINE  05/16/2023   Medicare Annual Wellness (AWV)  02/25/2024   Pneumonia Vaccine 76+ Years old  Completed   DEXA SCAN  Completed   Hepatitis C Screening  Completed   HPV VACCINES  Aged Out   Lung Cancer Screening  Discontinued   DTaP/Tdap/Td  Discontinued   COLONOSCOPY (Pts 45-35yrs Insurance coverage will need to be confirmed)  Discontinued    Health Maintenance  There are no preventive care reminders to display for this patient.   Colorectal cancer screening: No longer required.   Mammogram status: No longer required due to Age.  Bone Density status: Completed 02/22/14. Results reflect: Bone density results: OSTEOPOROSIS. Repeat every   years.  Lung Cancer Screening: (Low Dose CT Chest recommended if Age 25-80 years, 30 pack-year currently smoking OR have quit w/in 15years.) does not qualify.     Additional Screening:  Hepatitis C Screening: does qualify; Completed 01/19/23  Vision Screening: Recommended annual ophthalmology exams for early detection of glaucoma and other disorders of the eye. Is the patient up to date with their annual eye  exam?  No Who is the provider or what is the name of the office in which the patient attends annual eye exams? Deferred If pt is not established with a provider, would they like to be referred to a provider to establish care? No .   Dental Screening: Recommended annual dental exams for proper oral hygiene  Community Resource Referral / Chronic Care Management:  CRR required this visit?  No   CCM required this visit?  No      Plan:     I have personally reviewed and noted the following in the patient's chart:   Medical and social history Use of alcohol, tobacco or illicit drugs  Current medications and supplements including opioid prescriptions. Patient is not currently taking opioid prescriptions. Functional ability and status Nutritional status Physical activity Advanced directives List of other physicians Hospitalizations, surgeries, and ER visits in previous 12 months Vitals Screenings to include cognitive, depression, and falls Referrals and appointments  In addition, I have reviewed and discussed with patient certain preventive protocols, quality metrics, and best practice recommendations. A written personalized care plan for preventive services as well as general preventive health recommendations were provided to patient.     Tillie Rung, LPN   1/61/0960   Nurse Notes: None

## 2023-02-25 NOTE — Patient Instructions (Addendum)
[name] , Thank you for taking time to come for your Medicare Wellness Visit. I appreciate your ongoing commitment to your health goals. Please review the following plan we discussed and let me know if I can assist you in the future.   These are the goals we discussed:  Goals       Patient Stated      None at this time      Patient Stated      01/08/2022, wants to get well and be able to travel, work in the garden      Patient stated (pt-stated)      I want to walk without any pain.        This is a list of the screening recommended for you and due dates:  Health Maintenance  Topic Date Due   COVID-19 Vaccine (2 - Pfizer risk series) 03/13/2023*   Zoster (Shingles) Vaccine (1 of 2) 10/18/2023*   Flu Shot  05/16/2023   Medicare Annual Wellness Visit  02/25/2024   Pneumonia Vaccine  Completed   DEXA scan (bone density measurement)  Completed   Hepatitis C Screening: USPSTF Recommendation to screen - Ages 60-79 yo.  Completed   HPV Vaccine  Aged Out   Screening for Lung Cancer  Discontinued   DTaP/Tdap/Td vaccine  Discontinued   Colon Cancer Screening  Discontinued  *Topic was postponed. The date shown is not the original due date.    Advanced directives: Please bring a copy of your health care power of attorney and living will to the office to be added to your chart at your convenience.   Conditions/risks identified: None  Next appointment: Follow up in one year for your annual wellness visit     Preventive Care 65 Years and Older, Female Preventive care refers to lifestyle choices and visits with your health care provider that can promote health and wellness. What does preventive care include? A yearly physical exam. This is also called an annual well check. Dental exams once or twice a year. Routine eye exams. Ask your health care provider how often you should have your eyes checked. Personal lifestyle choices, including: Daily care of your teeth and gums. Regular  physical activity. Eating a healthy diet. Avoiding tobacco and drug use. Limiting alcohol use. Practicing safe sex. Taking low-dose aspirin every day. Taking vitamin and mineral supplements as recommended by your health care provider. What happens during an annual well check? The services and screenings done by your health care provider during your annual well check will depend on your age, overall health, lifestyle risk factors, and family history of disease. Counseling  Your health care provider may ask you questions about your: Alcohol use. Tobacco use. Drug use. Emotional well-being. Home and relationship well-being. Sexual activity. Eating habits. History of falls. Memory and ability to understand (cognition). Work and work Astronomer. Reproductive health. Screening  You may have the following tests or measurements: Height, weight, and BMI. Blood pressure. Lipid and cholesterol levels. These may be checked every 5 years, or more frequently if you are over 46 years old. Skin check. Lung cancer screening. You may have this screening every year starting at age 37 if you have a 30-pack-year history of smoking and currently smoke or have quit within the past 15 years. Fecal occult blood test (FOBT) of the stool. You may have this test every year starting at age 60. Flexible sigmoidoscopy or colonoscopy. You may have a sigmoidoscopy every 5 years or a colonoscopy every  10 years starting at age 33. Hepatitis C blood test. Hepatitis B blood test. Sexually transmitted disease (STD) testing. Diabetes screening. This is done by checking your blood sugar (glucose) after you have not eaten for a while (fasting). You may have this done every 1-3 years. Bone density scan. This is done to screen for osteoporosis. You may have this done starting at age 69. Mammogram. This may be done every 1-2 years. Talk to your health care provider about how often you should have regular mammograms. Talk  with your health care provider about your test results, treatment options, and if necessary, the need for more tests. Vaccines  Your health care provider may recommend certain vaccines, such as: Influenza vaccine. This is recommended every year. Tetanus, diphtheria, and acellular pertussis (Tdap, Td) vaccine. You may need a Td booster every 10 years. Zoster vaccine. You may need this after age 39. Pneumococcal 13-valent conjugate (PCV13) vaccine. One dose is recommended after age 44. Pneumococcal polysaccharide (PPSV23) vaccine. One dose is recommended after age 42. Talk to your health care provider about which screenings and vaccines you need and how often you need them. This information is not intended to replace advice given to you by your health care provider. Make sure you discuss any questions you have with your health care provider. Document Released: 10/28/2015 Document Revised: 06/20/2016 Document Reviewed: 08/02/2015 Elsevier Interactive Patient Education  2017 [employer] Prevention in the Home Falls can cause injuries. They can happen to people of all ages. There are many things you can do to make your home safe and to help prevent falls. What can I do on the outside of my home? Regularly fix the edges of walkways and driveways and fix any cracks. Remove anything that might make you trip as you walk through a door, such as a raised step or threshold. Trim any bushes or trees on the path to your home. Use bright outdoor lighting. Clear any walking paths of anything that might make someone trip, such as rocks or tools. Regularly check to see if handrails are loose or broken. Make sure that both sides of any steps have handrails. Any raised decks and porches should have guardrails on the edges. Have any leaves, snow, or ice cleared regularly. Use sand or salt on walking paths during winter. Clean up any spills in your garage right away. This includes oil or grease  spills. What can I do in the bathroom? Use night lights. Install grab bars by the toilet and in the tub and shower. Do not use towel bars as grab bars. Use non-skid mats or decals in the tub or shower. If you need to sit down in the shower, use a plastic, non-slip stool. Keep the floor dry. Clean up any water that spills on the floor as soon as it happens. Remove soap buildup in the tub or shower regularly. Attach bath mats securely with double-sided non-slip rug tape. Do not have throw rugs and other things on the floor that can make you trip. What can I do in the bedroom? Use night lights. Make sure that you have a light by your bed that is easy to reach. Do not use any sheets or blankets that are too big for your bed. They should not hang down onto the floor. Have a firm chair that has side arms. You can use this for support while you get dressed. Do not have throw rugs and other things on the floor that can make you  trip. What can I do in the kitchen? Clean up any spills right away. Avoid walking on wet floors. Keep items that you use a lot in easy-to-reach places. If you need to reach something above you, use a strong step stool that has a grab bar. Keep electrical cords out of the way. Do not use floor polish or wax that makes floors slippery. If you must use wax, use non-skid floor wax. Do not have throw rugs and other things on the floor that can make you trip. What can I do with my stairs? Do not leave any items on the stairs. Make sure that there are handrails on both sides of the stairs and use them. Fix handrails that are broken or loose. Make sure that handrails are as long as the stairways. Check any carpeting to make sure that it is firmly attached to the stairs. Fix any carpet that is loose or worn. Avoid having throw rugs at the top or bottom of the stairs. If you do have throw rugs, attach them to the floor with carpet tape. Make sure that you have a light switch at the  top of the stairs and the bottom of the stairs. If you do not have them, ask someone to add them for you. What else can I do to help prevent falls? Wear shoes that: Do not have high heels. Have rubber bottoms. Are comfortable and fit you well. Are closed at the toe. Do not wear sandals. If you use a stepladder: Make sure that it is fully opened. Do not climb a closed stepladder. Make sure that both sides of the stepladder are locked into place. Ask someone to hold it for you, if possible. Clearly mark and make sure that you can see: Any grab bars or handrails. First and last steps. Where the edge of each step is. Use tools that help you move around (mobility aids) if they are needed. These include: Canes. Walkers. Scooters. Crutches. Turn on the lights when you go into a dark area. Replace any light bulbs as soon as they burn out. Set up your furniture so you have a clear path. Avoid moving your furniture around. If any of your floors are uneven, fix them. If there are any pets around you, be aware of where they are. Review your medicines with your doctor. Some medicines can make you feel dizzy. This can increase your chance of falling. Ask your doctor what other things that you can do to help prevent falls. This information is not intended to replace advice given to you by your health care provider. Make sure you discuss any questions you have with your health care provider. Document Released: 07/28/2009 Document Revised: 03/08/2016 Document Reviewed: 11/05/2014 Elsevier Interactive Patient Education  2017 [employer].

## 2023-03-11 NOTE — Progress Notes (Signed)
HPI F Smoker(50 pkyrs) followed for OSA, Insomnia, complicated by  sCHF, CAD, Chronic DVT R lower leg, Mesenteric Ischemia, HTN, PAD, COPD( GOLD III)/ Dr Marchelle Gearing,  Chronic Respiratory Failure with Hypoxia, GERD, Hemoptysis, Hypothyroid,  Hx R Breast Cancer,  NPSG  07/27/20- AHI 26.5/ hr, desaturation to 84%, requiring 1L O2 (Nocturnal Hypoxemia),   Walk Test 12/24/2022 room air-minimum saturation 95%, maximum heart rate 102  =====================================================================================   12/24/22- 77 yoF Smoker(50 pkyrs) followed for OSA, Insomnia, complicated by  sCHF, CAD, Chronic DVT R lower leg, Mesenteric Ischemia, HTN, PAD, COPD( GOLD III)/ Dr Marchelle Gearing,  Chronic Respiratory Failure with Hypoxia, GERD, Hemoptysis, Hypothyroid,  Hx R Breast Cancer,  NPSG  07/27/20- AHI 26.5/ hr, desaturation to 84%, requiring 1L O2 (Nocturnal Hypoxemia),                                Patient is hard of hearing. Husband is here.  O2 4L [facility] 2L sleep  CPAP bought directly from [facility] -Trelegy 200, Neb Duoneb, Ventolin hfa, Neb Pulmicort, Dupixent, Lorazepam, Trazodone Body weight today- 118 lbs   135 lbs at initial NPSG Covid vax- 1 Phizer Flu vax-  -----Hasn't used cpap machine consistently. Was having issues previously with mask  Still using a borrowed machine. Stays on home oxygen now at 4 L with activity, 2 L for sleep. Complains of weakness dizziness and shortness of breath on short walks.  Arrival saturation was 95%.  Cough productive white.  We discussed indications for oxygen and considerations other than lung disease for dyspnea. We are going to reassess/update sleep study with HST. Walk Test 12/24/2022 room air-minimum saturation 95%, maximum heart rate 102  03/12/23- 77 yoF Smoker(50 pkyrs) followed for OSA, Insomnia, complicated by  sCHF, CAD, Chronic DVT R lower leg, Mesenteric Ischemia, HTN, PAD, COPD( GOLD III)/ Dr Marchelle Gearing,  Chronic Respiratory Failure with Hypoxia,  GERD, Hemoptysis, Hypothyroid,  Hx R Breast Cancer,                                Patient is hard of hearing. Husband is here.  O2 4L [facility] 2L sleep  CPAP bought directly from [facility] 10 cwp/ [facility] -Trelegy 200, Neb Duoneb, Ventolin hfa, Neb Pulmicort, Dupixent, Lorazepam, Trazodone HST 12/25/22- AHI 12.1/hr, deat to 80%/ avg 88%, body weight 118 lbs Body weight today-119 lbs Pt is doing well Sleep study reviewed.  She qualifies for replacement of old CPAP machine, changing to auto 5-15 and blending and O2 2 L for sleep. Back pain has been contributing to sleep difficulties but she reports it is improved.   ROS-see HPI   + = positive Constitutional:    +weight loss, night sweats, fevers, chills, fatigue, lassitude. HEENT:    headaches, difficulty swallowing, tooth/dental problems, sore throat,       Sneezing,+ itching, ear ache, nasal congestion, post nasal drip, snoring CV:    chest pain, orthopnea, PND, swelling in lower extremities, anasarca,                                   dizziness, palpitations Resp:   +shortness of breath with exertion or at rest.                +productive cough,   non-productive cough, +coughing up of blood.  change in color of mucus.  wheezing.   Skin:    +rash or lesions. GI:  + heartburn, +indigestion, abdominal pain, nausea, vomiting, diarrhea,                 change in bowel habits, loss of appetite GU: dysuria, change in color of urine, no urgency or frequency.   flank pain. MS:   joint pain, stiffness, decreased range of motion, back pain. Neuro-     nothing unusual Psych:  change in mood or affect.  +depression or anxiety.   memory loss.  OBJ- Physical Exam General- Alert, Oriented, Affect-appropriate, Distress- none acute, + thin/frail, + wheelchair Skin- rash-none, lesions- none, excoriation- none Lymphadenopathy- none Head- atraumatic            Eyes- Gross vision intact, PERRLA, conjunctivae and secretions clear            Ears-  Hearing, canals-normal            Nose- Clear, no-Septal dev, mucus, polyps, erosion, perforation             Throat- Mallampati II , mucosa clear , drainage- none, tonsils- atrophic Neck- flexible , trachea midline, no stridor , thyroid nl, carotid no bruit Chest - symmetrical excursion , unlabored           Heart/CV- RRR , no murmur , no gallop  , no rub, nl s1 s2                           - JVD- none , edema- none, stasis changes- none, varices- none           Lung- + decreased, no rhonchi, wheeze- none, cough+ dry, dullness-none, rub- none           Chest wall-  Abd-  Br/ Gen/ Rectal- Not done, not indicated Extrem- cyanosis- none, clubbing, none, atrophy- none, strength- nl Neuro- grossly intact to observation

## 2023-03-12 ENCOUNTER — Ambulatory Visit (INDEPENDENT_AMBULATORY_CARE_PROVIDER_SITE_OTHER): Payer: 59 | Admitting: Internal Medicine

## 2023-03-12 ENCOUNTER — Encounter: Payer: Self-pay | Admitting: Internal Medicine

## 2023-03-12 VITALS — BP 122/62 | HR 73 | Ht 65.0 in | Wt 119.8 lb

## 2023-03-12 DIAGNOSIS — J9611 Chronic respiratory failure with hypoxia: Secondary | ICD-10-CM

## 2023-03-12 DIAGNOSIS — G4733 Obstructive sleep apnea (adult) (pediatric): Secondary | ICD-10-CM

## 2023-03-12 NOTE — Patient Instructions (Signed)
Order- DME [facility]- please replace old CPAP machine auto 5-15, mask of choice, humidifier supplies, airView/ card  connector for O2 at night  Please call if we can help

## 2023-03-13 ENCOUNTER — Other Ambulatory Visit: Payer: Self-pay

## 2023-03-13 DIAGNOSIS — E785 Hyperlipidemia, unspecified: Secondary | ICD-10-CM

## 2023-03-13 DIAGNOSIS — I2510 Atherosclerotic heart disease of native coronary artery without angina pectoris: Secondary | ICD-10-CM

## 2023-03-13 DIAGNOSIS — I10 Essential (primary) hypertension: Secondary | ICD-10-CM

## 2023-03-13 MED ORDER — EZETIMIBE 10 MG PO TABS
10.0000 mg | ORAL_TABLET | Freq: Every day | ORAL | 3 refills | Status: DC
Start: 2023-03-13 — End: 2023-04-29

## 2023-03-16 DIAGNOSIS — J449 Chronic obstructive pulmonary disease, unspecified: Secondary | ICD-10-CM | POA: Diagnosis not present

## 2023-03-16 DIAGNOSIS — J9601 Acute respiratory failure with hypoxia: Secondary | ICD-10-CM | POA: Diagnosis not present

## 2023-03-16 DIAGNOSIS — R0602 Shortness of breath: Secondary | ICD-10-CM | POA: Diagnosis not present

## 2023-03-19 ENCOUNTER — Encounter: Payer: Self-pay | Admitting: Family Medicine

## 2023-03-19 NOTE — Telephone Encounter (Signed)
  levothyroxine (SYNTHROID) 112 MCG tablet ezetimibe (ZETIA) 10 MG tablet

## 2023-03-20 NOTE — Telephone Encounter (Signed)
Pharmacy calling to refill meds that have already been refilled.

## 2023-03-25 ENCOUNTER — Other Ambulatory Visit: Payer: Self-pay | Admitting: Family Medicine

## 2023-03-25 DIAGNOSIS — E876 Hypokalemia: Secondary | ICD-10-CM

## 2023-04-01 ENCOUNTER — Ambulatory Visit (INDEPENDENT_AMBULATORY_CARE_PROVIDER_SITE_OTHER): Payer: 59

## 2023-04-01 DIAGNOSIS — E538 Deficiency of other specified B group vitamins: Secondary | ICD-10-CM | POA: Diagnosis not present

## 2023-04-01 MED ORDER — CYANOCOBALAMIN 1000 MCG/ML IJ SOLN
1000.0000 ug | Freq: Once | INTRAMUSCULAR | Status: AC
Start: 2023-04-01 — End: 2023-04-01
  Administered 2023-04-01: 1000 ug via INTRAMUSCULAR

## 2023-04-01 NOTE — Progress Notes (Signed)
Pt here for monthly B12 injection per Dr. Jordan.  B12 1000mcg given IM and pt tolerated injection well.   

## 2023-04-04 ENCOUNTER — Other Ambulatory Visit: Payer: Self-pay | Admitting: Family Medicine

## 2023-04-04 DIAGNOSIS — F419 Anxiety disorder, unspecified: Secondary | ICD-10-CM

## 2023-04-09 ENCOUNTER — Telehealth: Payer: Self-pay | Admitting: Family Medicine

## 2023-04-09 DIAGNOSIS — I2510 Atherosclerotic heart disease of native coronary artery without angina pectoris: Secondary | ICD-10-CM

## 2023-04-09 DIAGNOSIS — E039 Hypothyroidism, unspecified: Secondary | ICD-10-CM

## 2023-04-09 DIAGNOSIS — I10 Essential (primary) hypertension: Secondary | ICD-10-CM

## 2023-04-09 DIAGNOSIS — E7800 Pure hypercholesterolemia, unspecified: Secondary | ICD-10-CM

## 2023-04-09 MED ORDER — ATORVASTATIN CALCIUM 80 MG PO TABS
80.0000 mg | ORAL_TABLET | Freq: Every day | ORAL | 3 refills | Status: DC
Start: 2023-04-09 — End: 2024-04-10

## 2023-04-09 MED ORDER — LEVOTHYROXINE SODIUM 112 MCG PO TABS
ORAL_TABLET | ORAL | 2 refills | Status: DC
Start: 2023-04-09 — End: 2024-01-06

## 2023-04-09 NOTE — Telephone Encounter (Signed)
Rxs sent

## 2023-04-09 NOTE — Telephone Encounter (Signed)
Prescription Request  04/09/2023  LOV: 12/25/2022  What is the name of the medication or equipment?  levothyroxine levothyroxine (SYNTHROID) 112 MCG tablet  atorvastatin atorvastatin (LIPITOR) 80 MG tablet  Have you contacted your pharmacy to request a refill? Yes   Which pharmacy would you like this sent to?   [facility] - [place] - [address] [place] [address] Phone: [phone] Fax: [phone]   Patient notified that their request is being sent to the clinical staff for review and that they should receive a response within 2 business days.

## 2023-04-21 DIAGNOSIS — J9601 Acute respiratory failure with hypoxia: Secondary | ICD-10-CM | POA: Diagnosis not present

## 2023-04-21 DIAGNOSIS — R0602 Shortness of breath: Secondary | ICD-10-CM | POA: Diagnosis not present

## 2023-04-21 DIAGNOSIS — J449 Chronic obstructive pulmonary disease, unspecified: Secondary | ICD-10-CM | POA: Diagnosis not present

## 2023-04-22 DIAGNOSIS — J449 Chronic obstructive pulmonary disease, unspecified: Secondary | ICD-10-CM | POA: Diagnosis not present

## 2023-04-29 ENCOUNTER — Other Ambulatory Visit: Payer: Self-pay

## 2023-04-29 DIAGNOSIS — I10 Essential (primary) hypertension: Secondary | ICD-10-CM

## 2023-04-29 DIAGNOSIS — E785 Hyperlipidemia, unspecified: Secondary | ICD-10-CM

## 2023-04-29 DIAGNOSIS — I2510 Atherosclerotic heart disease of native coronary artery without angina pectoris: Secondary | ICD-10-CM

## 2023-04-29 MED ORDER — EZETIMIBE 10 MG PO TABS
10.0000 mg | ORAL_TABLET | Freq: Every day | ORAL | 0 refills | Status: DC
Start: 2023-04-29 — End: 2023-07-10

## 2023-05-01 ENCOUNTER — Encounter: Payer: Self-pay | Admitting: Internal Medicine

## 2023-05-01 ENCOUNTER — Inpatient Hospital Stay (HOSPITAL_COMMUNITY)
Admission: EM | Admit: 2023-05-01 | Discharge: 2023-05-06 | DRG: 193 | Disposition: A | Discharge status: Home / Self Care | Payer: 59 | Attending: Family Medicine | Admitting: Family Medicine

## 2023-05-01 ENCOUNTER — Other Ambulatory Visit: Payer: Self-pay

## 2023-05-01 ENCOUNTER — Emergency Department (HOSPITAL_COMMUNITY): Payer: 59

## 2023-05-01 ENCOUNTER — Encounter (HOSPITAL_COMMUNITY): Payer: Self-pay

## 2023-05-01 DIAGNOSIS — I739 Peripheral vascular disease, unspecified: Secondary | ICD-10-CM | POA: Diagnosis present

## 2023-05-01 DIAGNOSIS — K219 Gastro-esophageal reflux disease without esophagitis: Secondary | ICD-10-CM | POA: Diagnosis present

## 2023-05-01 DIAGNOSIS — E785 Hyperlipidemia, unspecified: Secondary | ICD-10-CM | POA: Diagnosis present

## 2023-05-01 DIAGNOSIS — Z1152 Encounter for screening for COVID-19: Secondary | ICD-10-CM | POA: Diagnosis not present

## 2023-05-01 DIAGNOSIS — F17200 Nicotine dependence, unspecified, uncomplicated: Secondary | ICD-10-CM | POA: Diagnosis present

## 2023-05-01 DIAGNOSIS — Z882 Allergy status to sulfonamides status: Secondary | ICD-10-CM | POA: Diagnosis not present

## 2023-05-01 DIAGNOSIS — R636 Underweight: Secondary | ICD-10-CM | POA: Diagnosis present

## 2023-05-01 DIAGNOSIS — Z825 Family history of asthma and other chronic lower respiratory diseases: Secondary | ICD-10-CM

## 2023-05-01 DIAGNOSIS — Z681 Body mass index (BMI) 19 or less, adult: Secondary | ICD-10-CM

## 2023-05-01 DIAGNOSIS — J439 Emphysema, unspecified: Secondary | ICD-10-CM | POA: Diagnosis not present

## 2023-05-01 DIAGNOSIS — Z8049 Family history of malignant neoplasm of other genital organs: Secondary | ICD-10-CM

## 2023-05-01 DIAGNOSIS — E039 Hypothyroidism, unspecified: Secondary | ICD-10-CM | POA: Diagnosis not present

## 2023-05-01 DIAGNOSIS — Z90710 Acquired absence of both cervix and uterus: Secondary | ICD-10-CM

## 2023-05-01 DIAGNOSIS — M25511 Pain in right shoulder: Secondary | ICD-10-CM | POA: Diagnosis not present

## 2023-05-01 DIAGNOSIS — Z961 Presence of intraocular lens: Secondary | ICD-10-CM | POA: Diagnosis present

## 2023-05-01 DIAGNOSIS — K5730 Diverticulosis of large intestine without perforation or abscess without bleeding: Secondary | ICD-10-CM | POA: Diagnosis not present

## 2023-05-01 DIAGNOSIS — I2510 Atherosclerotic heart disease of native coronary artery without angina pectoris: Secondary | ICD-10-CM | POA: Diagnosis not present

## 2023-05-01 DIAGNOSIS — Z716 Tobacco abuse counseling: Secondary | ICD-10-CM | POA: Diagnosis not present

## 2023-05-01 DIAGNOSIS — Z7951 Long term (current) use of inhaled steroids: Secondary | ICD-10-CM

## 2023-05-01 DIAGNOSIS — M810 Age-related osteoporosis without current pathological fracture: Secondary | ICD-10-CM | POA: Diagnosis present

## 2023-05-01 DIAGNOSIS — Z79899 Other long term (current) drug therapy: Secondary | ICD-10-CM

## 2023-05-01 DIAGNOSIS — I110 Hypertensive heart disease with heart failure: Secondary | ICD-10-CM | POA: Diagnosis not present

## 2023-05-01 DIAGNOSIS — M797 Fibromyalgia: Secondary | ICD-10-CM | POA: Diagnosis present

## 2023-05-01 DIAGNOSIS — Z9011 Acquired absence of right breast and nipple: Secondary | ICD-10-CM

## 2023-05-01 DIAGNOSIS — J90 Pleural effusion, not elsewhere classified: Secondary | ICD-10-CM | POA: Diagnosis not present

## 2023-05-01 DIAGNOSIS — I10 Essential (primary) hypertension: Secondary | ICD-10-CM | POA: Diagnosis present

## 2023-05-01 DIAGNOSIS — K439 Ventral hernia without obstruction or gangrene: Secondary | ICD-10-CM | POA: Diagnosis not present

## 2023-05-01 DIAGNOSIS — Z7902 Long term (current) use of antithrombotics/antiplatelets: Secondary | ICD-10-CM

## 2023-05-01 DIAGNOSIS — E876 Hypokalemia: Secondary | ICD-10-CM | POA: Diagnosis present

## 2023-05-01 DIAGNOSIS — J9621 Acute and chronic respiratory failure with hypoxia: Secondary | ICD-10-CM | POA: Diagnosis not present

## 2023-05-01 DIAGNOSIS — F419 Anxiety disorder, unspecified: Secondary | ICD-10-CM | POA: Diagnosis present

## 2023-05-01 DIAGNOSIS — R059 Cough, unspecified: Secondary | ICD-10-CM | POA: Diagnosis not present

## 2023-05-01 DIAGNOSIS — J189 Pneumonia, unspecified organism: Secondary | ICD-10-CM | POA: Diagnosis present

## 2023-05-01 DIAGNOSIS — Z800 Family history of malignant neoplasm of digestive organs: Secondary | ICD-10-CM

## 2023-05-01 DIAGNOSIS — Z833 Family history of diabetes mellitus: Secondary | ICD-10-CM

## 2023-05-01 DIAGNOSIS — R0989 Other specified symptoms and signs involving the circulatory and respiratory systems: Secondary | ICD-10-CM | POA: Diagnosis not present

## 2023-05-01 DIAGNOSIS — Z808 Family history of malignant neoplasm of other organs or systems: Secondary | ICD-10-CM

## 2023-05-01 DIAGNOSIS — Z9842 Cataract extraction status, left eye: Secondary | ICD-10-CM

## 2023-05-01 DIAGNOSIS — Z86718 Personal history of other venous thrombosis and embolism: Secondary | ICD-10-CM

## 2023-05-01 DIAGNOSIS — Z66 Do not resuscitate: Secondary | ICD-10-CM | POA: Diagnosis not present

## 2023-05-01 DIAGNOSIS — J432 Centrilobular emphysema: Secondary | ICD-10-CM | POA: Diagnosis not present

## 2023-05-01 DIAGNOSIS — Z8041 Family history of malignant neoplasm of ovary: Secondary | ICD-10-CM

## 2023-05-01 DIAGNOSIS — K580 Irritable bowel syndrome with diarrhea: Secondary | ICD-10-CM | POA: Diagnosis present

## 2023-05-01 DIAGNOSIS — Z981 Arthrodesis status: Secondary | ICD-10-CM

## 2023-05-01 DIAGNOSIS — J441 Chronic obstructive pulmonary disease with (acute) exacerbation: Secondary | ICD-10-CM | POA: Diagnosis present

## 2023-05-01 DIAGNOSIS — Z803 Family history of malignant neoplasm of breast: Secondary | ICD-10-CM

## 2023-05-01 DIAGNOSIS — Z974 Presence of external hearing-aid: Secondary | ICD-10-CM

## 2023-05-01 DIAGNOSIS — I5032 Chronic diastolic (congestive) heart failure: Secondary | ICD-10-CM | POA: Diagnosis present

## 2023-05-01 DIAGNOSIS — Z8249 Family history of ischemic heart disease and other diseases of the circulatory system: Secondary | ICD-10-CM | POA: Diagnosis not present

## 2023-05-01 DIAGNOSIS — T699XXA Effect of reduced temperature, unspecified, initial encounter: Secondary | ICD-10-CM | POA: Diagnosis not present

## 2023-05-01 DIAGNOSIS — Z888 Allergy status to other drugs, medicaments and biological substances status: Secondary | ICD-10-CM

## 2023-05-01 DIAGNOSIS — Z79890 Hormone replacement therapy (postmenopausal): Secondary | ICD-10-CM

## 2023-05-01 DIAGNOSIS — J440 Chronic obstructive pulmonary disease with acute lower respiratory infection: Secondary | ICD-10-CM | POA: Diagnosis not present

## 2023-05-01 DIAGNOSIS — Z9049 Acquired absence of other specified parts of digestive tract: Secondary | ICD-10-CM

## 2023-05-01 DIAGNOSIS — Z853 Personal history of malignant neoplasm of breast: Secondary | ICD-10-CM

## 2023-05-01 DIAGNOSIS — R918 Other nonspecific abnormal finding of lung field: Secondary | ICD-10-CM | POA: Diagnosis not present

## 2023-05-01 DIAGNOSIS — Z9841 Cataract extraction status, right eye: Secondary | ICD-10-CM

## 2023-05-01 DIAGNOSIS — J449 Chronic obstructive pulmonary disease, unspecified: Secondary | ICD-10-CM

## 2023-05-01 DIAGNOSIS — K8020 Calculus of gallbladder without cholecystitis without obstruction: Secondary | ICD-10-CM | POA: Diagnosis not present

## 2023-05-01 DIAGNOSIS — G4730 Sleep apnea, unspecified: Secondary | ICD-10-CM | POA: Diagnosis present

## 2023-05-01 LAB — CBC WITH DIFFERENTIAL/PLATELET
Abs Immature Granulocytes: 0.07 10*3/uL (ref 0.00–0.07)
Basophils Absolute: 0.1 10*3/uL (ref 0.0–0.1)
Basophils Relative: 0 %
Eosinophils Absolute: 0.1 10*3/uL (ref 0.0–0.5)
Eosinophils Relative: 1 %
HCT: 42.6 % (ref 36.0–46.0)
Hemoglobin: 14.1 g/dL (ref 12.0–15.0)
Immature Granulocytes: 1 %
Lymphocytes Relative: 4 %
Lymphs Abs: 0.6 10*3/uL — ABNORMAL LOW (ref 0.7–4.0)
MCH: 30.7 pg (ref 26.0–34.0)
MCHC: 33.1 g/dL (ref 30.0–36.0)
MCV: 92.6 fL (ref 80.0–100.0)
Monocytes Absolute: 1.4 10*3/uL — ABNORMAL HIGH (ref 0.1–1.0)
Monocytes Relative: 10 %
Neutro Abs: 12.5 10*3/uL — ABNORMAL HIGH (ref 1.7–7.7)
Neutrophils Relative %: 84 %
Platelets: 293 10*3/uL (ref 150–400)
RBC: 4.6 MIL/uL (ref 3.87–5.11)
RDW: 14.7 % (ref 11.5–15.5)
WBC: 14.7 10*3/uL — ABNORMAL HIGH (ref 4.0–10.5)
nRBC: 0 % (ref 0.0–0.2)

## 2023-05-01 LAB — BASIC METABOLIC PANEL
Anion gap: 10 (ref 5–15)
Anion gap: 7 (ref 5–15)
BUN: 11 mg/dL (ref 8–23)
BUN: 12 mg/dL (ref 8–23)
CO2: 28 mmol/L (ref 22–32)
CO2: 23 mmol/L (ref 22–32)
Calcium: 8.7 mg/dL — ABNORMAL LOW (ref 8.9–10.3)
Calcium: 8.8 mg/dL — ABNORMAL LOW (ref 8.9–10.3)
Chloride: 97 mmol/L — ABNORMAL LOW (ref 98–111)
Chloride: 103 mmol/L (ref 98–111)
Creatinine, Ser: 0.71 mg/dL (ref 0.44–1.00)
Creatinine, Ser: 0.72 mg/dL (ref 0.44–1.00)
GFR, Estimated: >60 mL/min (ref >60)
GFR, Estimated: >60 mL/min (ref >60)
Glucose, Bld: 105 mg/dL — ABNORMAL HIGH (ref 70–99)
Glucose, Bld: 143 mg/dL — ABNORMAL HIGH (ref 70–99)
Potassium: 2.7 mmol/L — CL (ref 3.5–5.1)
Potassium: 3.9 mmol/L (ref 3.5–5.1)
Sodium: 135 mmol/L (ref 135–145)
Sodium: 133 mmol/L — ABNORMAL LOW (ref 135–145)

## 2023-05-01 LAB — TROPONIN I (HIGH SENSITIVITY)
Troponin I (High Sensitivity): 14 ng/L (ref <18)
Troponin I (High Sensitivity): 15 ng/L (ref <18)

## 2023-05-01 LAB — MAGNESIUM: Magnesium: 1.7 mg/dL (ref 1.7–2.4)

## 2023-05-01 LAB — D-DIMER, QUANTITATIVE: D-Dimer, Quant: 0.89 ug/mL-FEU — ABNORMAL HIGH (ref 0.00–0.50)

## 2023-05-01 LAB — BRAIN NATRIURETIC PEPTIDE: B Natriuretic Peptide: 123.7 pg/mL — ABNORMAL HIGH (ref 0.0–100.0)

## 2023-05-01 LAB — SARS CORONAVIRUS 2 BY RT PCR: SARS Coronavirus 2 by RT PCR: NEGATIVE

## 2023-05-01 MED ORDER — TRAZODONE HCL 50 MG PO TABS
100.0000 mg | ORAL_TABLET | Freq: Every evening | ORAL | Status: DC | PRN
  Administered 2023-05-01: 100 mg via ORAL
  Filled 2023-05-01: qty 2

## 2023-05-01 MED ORDER — POLYETHYLENE GLYCOL 3350 17 G PO PACK: 17.0000 g | PACK | Freq: Every day | ORAL | Status: DC | PRN

## 2023-05-01 MED ORDER — SODIUM CHLORIDE 0.9 % IV SOLN
1.0000 g | Freq: Once | INTRAVENOUS | Status: AC
  Administered 2023-05-01: 1 g via INTRAVENOUS
  Filled 2023-05-01: qty 10

## 2023-05-01 MED ORDER — POTASSIUM CHLORIDE 10 MEQ/100ML IV SOLN
10.0000 meq | INTRAVENOUS | Status: AC
  Administered 2023-05-01 (×2): 10 meq via INTRAVENOUS
  Filled 2023-05-01 (×2): qty 100

## 2023-05-01 MED ORDER — IPRATROPIUM-ALBUTEROL 0.5-2.5 (3) MG/3ML IN SOLN
3.0000 mL | Freq: Once | RESPIRATORY_TRACT | Status: AC
  Administered 2023-05-01: 3 mL via RESPIRATORY_TRACT
  Filled 2023-05-01: qty 3

## 2023-05-01 MED ORDER — ACETAMINOPHEN 650 MG RE SUPP: 650.0000 mg | Freq: Four times a day (QID) | RECTAL | Status: DC | PRN

## 2023-05-01 MED ORDER — CILOSTAZOL 100 MG PO TABS
100.0000 mg | ORAL_TABLET | Freq: Two times a day (BID) | ORAL | Status: DC
  Administered 2023-05-01 – 2023-05-06 (×10): 100 mg via ORAL
  Filled 2023-05-01 (×14): qty 1

## 2023-05-01 MED ORDER — ASPIRIN 81 MG PO TBEC
81.0000 mg | DELAYED_RELEASE_TABLET | Freq: Every day | ORAL | Status: DC
  Administered 2023-05-02 – 2023-05-06 (×5): 81 mg via ORAL
  Filled 2023-05-01 (×5): qty 1

## 2023-05-01 MED ORDER — LORAZEPAM 1 MG PO TABS
2.0000 mg | ORAL_TABLET | Freq: Every day | ORAL | Status: AC
  Administered 2023-05-01 – 2023-05-03 (×3): 2 mg via ORAL
  Filled 2023-05-01 (×3): qty 2

## 2023-05-01 MED ORDER — SODIUM CHLORIDE 0.9 % IV SOLN
500.0000 mg | INTRAVENOUS | Status: AC
  Administered 2023-05-02 – 2023-05-03 (×2): 500 mg via INTRAVENOUS
  Filled 2023-05-01 (×3): qty 5

## 2023-05-01 MED ORDER — IOHEXOL 350 MG/ML SOLN
80.0000 mL | Freq: Once | INTRAVENOUS | Status: AC | PRN
  Administered 2023-05-01: 80 mL via INTRAVENOUS

## 2023-05-01 MED ORDER — PREDNISONE 50 MG PO TABS
50.0000 mg | ORAL_TABLET | Freq: Every day | ORAL | Status: DC
  Administered 2023-05-02 – 2023-05-04 (×3): 50 mg via ORAL
  Filled 2023-05-01 (×3): qty 1

## 2023-05-01 MED ORDER — ACETAMINOPHEN 325 MG PO TABS
650.0000 mg | ORAL_TABLET | Freq: Four times a day (QID) | ORAL | Status: DC | PRN
  Administered 2023-05-03: 650 mg via ORAL
  Filled 2023-05-01: qty 2

## 2023-05-01 MED ORDER — ACETAMINOPHEN 325 MG PO TABS
650.0000 mg | ORAL_TABLET | Freq: Once | ORAL | Status: AC
  Administered 2023-05-01: 650 mg via ORAL
  Filled 2023-05-01: qty 2

## 2023-05-01 MED ORDER — SODIUM CHLORIDE 0.9% FLUSH
3.0000 mL | Freq: Two times a day (BID) | INTRAVENOUS | Status: DC
  Administered 2023-05-01 – 2023-05-06 (×10): 3 mL via INTRAVENOUS

## 2023-05-01 MED ORDER — LEVOTHYROXINE SODIUM 100 MCG PO TABS
100.0000 ug | ORAL_TABLET | Freq: Every day | ORAL | Status: DC
  Administered 2023-05-02 – 2023-05-06 (×5): 100 ug via ORAL
  Filled 2023-05-01 (×5): qty 1

## 2023-05-01 MED ORDER — METHYLPREDNISOLONE SODIUM SUCC 125 MG IJ SOLR
125.0000 mg | Freq: Once | INTRAMUSCULAR | Status: AC
  Administered 2023-05-01: 125 mg via INTRAVENOUS
  Filled 2023-05-01: qty 2

## 2023-05-01 MED ORDER — SODIUM CHLORIDE 0.9 % IV SOLN
1.0000 g | INTRAVENOUS | Status: DC
  Administered 2023-05-02 – 2023-05-06 (×5): 1 g via INTRAVENOUS
  Filled 2023-05-01 (×5): qty 10

## 2023-05-01 MED ORDER — ATORVASTATIN CALCIUM 40 MG PO TABS
80.0000 mg | ORAL_TABLET | Freq: Every day | ORAL | Status: DC
  Administered 2023-05-02 – 2023-05-06 (×5): 80 mg via ORAL
  Filled 2023-05-01 (×5): qty 2

## 2023-05-01 MED ORDER — PANTOPRAZOLE SODIUM 20 MG PO TBEC
20.0000 mg | DELAYED_RELEASE_TABLET | Freq: Every day | ORAL | Status: DC
  Administered 2023-05-02: 20 mg via ORAL
  Filled 2023-05-01: qty 1

## 2023-05-01 MED ORDER — POTASSIUM CHLORIDE 20 MEQ PO PACK
60.0000 meq | PACK | ORAL | Status: AC
  Administered 2023-05-01: 60 meq via ORAL
  Filled 2023-05-01: qty 3

## 2023-05-01 MED ORDER — IPRATROPIUM-ALBUTEROL 0.5-2.5 (3) MG/3ML IN SOLN
3.0000 mL | Freq: Four times a day (QID) | RESPIRATORY_TRACT | Status: DC
  Administered 2023-05-01 – 2023-05-04 (×12): 3 mL via RESPIRATORY_TRACT
  Filled 2023-05-01 (×12): qty 3

## 2023-05-01 MED ORDER — POTASSIUM CHLORIDE CRYS ER 20 MEQ PO TBCR
40.0000 meq | EXTENDED_RELEASE_TABLET | Freq: Once | ORAL | Status: AC
  Administered 2023-05-01: 40 meq via ORAL
  Filled 2023-05-01: qty 2

## 2023-05-01 MED ORDER — ENOXAPARIN SODIUM 40 MG/0.4ML IJ SOSY: 40.0000 mg | PREFILLED_SYRINGE | INTRAMUSCULAR | Status: DC

## 2023-05-01 MED ORDER — AZITHROMYCIN 250 MG PO TABS
500.0000 mg | ORAL_TABLET | Freq: Once | ORAL | Status: AC
  Administered 2023-05-01: 500 mg via ORAL
  Filled 2023-05-01: qty 2

## 2023-05-01 NOTE — ED Triage Notes (Signed)
BIB EMS from home for cough, congestion. Pt states she has been SOB, pt is not in distress on arrival and walks to the room from EMS stretcher. Only wears O2 at night.  Hx COPD

## 2023-05-01 NOTE — Assessment & Plan Note (Signed)
Check mag. Agresive repletion as patinet has nromal renal fucntion. Check again at 10 PM

## 2023-05-01 NOTE — Assessment & Plan Note (Signed)
Despite her thin body habitus she does continue to have obstructive sleep apnea and benefits from CPAP. Plan-replace old CPAP auto 5-15 with O2 2 L bleed in

## 2023-05-01 NOTE — ED Notes (Signed)
Pt placed on 2L [place]

## 2023-05-01 NOTE — ED Provider Notes (Signed)
[facility] AT [facility] Provider Note   CSN: [id number] Arrival date & time: 05/01/23  1512     History  Chief Complaint  Patient presents with   Cough   Nasal Congestion    [name] is a 77 y.o. female.   Cough    Patient states she started having trouble in the last day or so of feeling short of breath.  Patient states she is coughing up some clear sputum.  She is feeling short of breath when she is walking.  She is also having some pain in her shoulder that gets worse with inspiration.  She also has some pain with moving her right shoulder.  She denies any fevers.  No leg swelling.  Patient does have history of COPD and wears oxygen at night.  She does smoke although not that much anymore.  Home Medications Prior to Admission medications   Medication Sig Start Date End Date Taking? Authorizing Provider  albuterol (VENTOLIN HFA) 108 (90 Base) MCG/ACT inhaler TAKE 2 PUFFS BY MOUTH EVERY 6 HOURS AS NEEDED FOR WHEEZE OR SHORTNESS OF BREATH 09/05/20   Glenford Bayley, NP  aspirin EC 81 MG tablet Take 1 tablet (81 mg total) by mouth daily. 08/21/20   Lonia Blood, MD  atorvastatin (LIPITOR) 80 MG tablet Take 1 tablet (80 mg total) by mouth daily. 04/09/23   [place], [name], MD  budesonide (PULMICORT) 0.5 MG/2ML nebulizer solution Take 2 mLs (0.5 mg total) by nebulization 2 (two) times daily. 09/15/20   Aquilla Hacker, MD  cilostazol (PLETAL) 100 MG tablet Take 1 tablet (100 mg total) by mouth 2 (two) times daily. 09/11/22   Meriam Sprague, MD  clopidogrel (PLAVIX) 75 MG tablet Take 1 tablet (75 mg total) by mouth daily. 10/27/22   Jadene Pierini, MD  cyanocobalamin (VITAMIN B12) 1000 MCG/ML injection Inject 1,000 mcg into the muscle every 30 (thirty) days.    [provider]  cyclobenzaprine (FLEXERIL) 10 MG tablet Take 1 tablet (10 mg total) by mouth 3 (three) times daily as needed for muscle spasms. 10/24/22    Jadene Pierini, MD  DUPIXENT 300 MG/2ML SOPN Inject 300 mg into the skin every 14 (fourteen) days. 01/03/21   [provider]  ezetimibe (ZETIA) 10 MG tablet Take 1 tablet (10 mg total) by mouth daily. 04/29/23   Meriam Sprague, MD  ferrous sulfate (FEROSUL) 325 (65 FE) MG tablet TAKE ONE TABLET BY MOUTH DAILY AT 9AM WITH BREAKFAST 01/14/23   [place], [name], MD  fluticasone [facility]) 50 MCG/ACT nasal spray SPRAY 1 SPRAY IN EACH NOSTRIL TWICE DAILY (BULK) 01/14/23   [place], [name], MD  Fluticasone-Umeclidin-Vilant (TRELEGY ELLIPTA) 200-62.5-25 MCG/ACT AEPB TAKE 1 PUFF BY MOUTH EVERY DAY 12/25/21   Jetty Duhamel D, MD  furosemide (LASIX) 40 MG tablet TAKE 1 TABLET BY MOUTH TWICE  DAILY 07/31/22   [place], [name], MD  ipratropium-albuterol (DUONEB) 0.5-2.5 (3) MG/3ML SOLN Inhale 3 mLs into the lungs as directed. TID for 1 week and then use as needed Q6 hours. 11/07/21   Rolly Salter, MD  KLOR-CON M20 20 MEQ tablet TAKE 1 TABLET BY MOUTH EVERY DAY 03/25/23   [place], [name], MD  levothyroxine (SYNTHROID) 112 MCG tablet TAKE ONE-HALF TABLET BY MOUTH ON TUESDAY AND THURSDAY . TAKE 1  TABLET BY MOUTH ON MONDAY,  WEDNESDAY, AND FRIDAY , SATURDAY , AND SUNDAY 04/09/23   [place], [name], MD  LORazepam (ATIVAN) 2 MG tablet TAKE ONE TABLET BY MOUTH DAILY AT 9PM AT BEDTIME 02/04/23   [place], [name], MD  montelukast (SINGULAIR) 10 MG tablet TAKE 1 TABLET BY MOUTH EVERYDAY AT BEDTIME 04/21/21   Glenford Bayley, NP  pantoprazole (PROTONIX) 20 MG tablet Take 1 tablet (20 mg total) by mouth 2 (two) times daily before a meal. 12/25/22   [place], [name], MD  PARoxetine (PAXIL) 20 MG tablet TAKE ONE TABLET BY MOUTH DAILY AT 9AM 04/05/23   [place], [name], MD  tamsulosin (FLOMAX) 0.4 MG CAPS capsule TAKE ONE CAPSULE (0.4 MG TOTAL) BY MOUTH DAILY AT 9AM 01/14/23   [place], [name], MD  traZODone (DESYREL) 100 MG tablet TAKE ONE TABLET BY MOUTH DAILY AT 9PM AT BEDTIME 02/11/23   [place], [name], MD       Allergies    Sinequan [doxepin], Sulfa antibiotics, and Sulfur    Review of Systems   Review of Systems  Respiratory:  Positive for cough.     Physical Exam Updated Vital Signs BP 131/62   Pulse 81   Temp 98.7 F (37.1 C) (Oral)   Resp 18   Ht 1.651 m (5\' 5" )   Wt 53.1 kg   SpO2 93%   BMI 19.47 kg/m  Physical Exam Vitals and nursing note reviewed.  Constitutional:      Appearance: She is underweight.  HENT:     Head: Normocephalic and atraumatic.     Right Ear: External ear normal.     Left Ear: External ear normal.  Eyes:     General: No scleral icterus.       Right eye: No discharge.        Left eye: No discharge.     Conjunctiva/sclera: Conjunctivae normal.  Neck:     Trachea: No tracheal deviation.  Cardiovascular:     Rate and Rhythm: Normal rate and regular rhythm.  Pulmonary:     Effort: Pulmonary effort is normal. No respiratory distress.     Breath sounds: No stridor. Wheezing present. No rales.  Abdominal:     General: Bowel sounds are normal. There is no distension.     Palpations: Abdomen is soft.     Tenderness: There is no abdominal tenderness. There is no guarding or rebound.  Musculoskeletal:        General: No swelling, tenderness or deformity.     Cervical back: Neck supple.     Comments: Mild pain with range of motion right shoulder  Skin:    General: Skin is warm and dry.     Findings: No rash.  Neurological:     General: No focal deficit present.     Mental Status: She is alert.     Cranial Nerves: No cranial nerve deficit, dysarthria or facial asymmetry.     Sensory: No sensory deficit.     Motor: No abnormal muscle tone or seizure activity.     Coordination: Coordination normal.  Psychiatric:        Mood and Affect: Mood normal.     ED Results / Procedures / Treatments   Labs (all labs ordered are listed, but only abnormal results are displayed) Labs Reviewed  BASIC METABOLIC PANEL - Abnormal; Notable for the following  components:      Result Value   Potassium 2.7 (*)    Chloride 97 (*)    Glucose, Bld 105 (*)    Calcium 8.7 (*)    All other components within  normal limits  CBC WITH DIFFERENTIAL/PLATELET - Abnormal; Notable for the following components:   WBC 14.7 (*)    Neutro Abs 12.5 (*)    Lymphs Abs 0.6 (*)    Monocytes Absolute 1.4 (*)    All other components within normal limits  BRAIN NATRIURETIC PEPTIDE - Abnormal; Notable for the following components:   B Natriuretic Peptide 123.7 (*)    All other components within normal limits  D-DIMER, QUANTITATIVE - Abnormal; Notable for the following components:   D-Dimer, Quant 0.89 (*)    All other components within normal limits  SARS CORONAVIRUS 2 BY RT PCR  TROPONIN I (HIGH SENSITIVITY)  TROPONIN I (HIGH SENSITIVITY)    EKG EKG Interpretation Date/Time:  Wednesday May 01 2023 15:26:31 EDT Ventricular Rate:  86 PR Interval:  177 QRS Duration:  142 QT Interval:  382 QTC Calculation: 457 R Axis:   73  Text Interpretation: Sinus rhythm Nonspecific intraventricular conduction delay Anterior infarct, old No significant change since last tracing Confirmed by Linwood Dibbles [phone]) on 05/01/2023 4:50:17 PM  Radiology CT Angio Chest PE W and/or Wo Contrast  Result Date: 05/01/2023 CLINICAL DATA:  Pulmonary embolism (PE) suspected, low to intermediate prob, positive D-dimer EXAM: CT ANGIOGRAPHY CHEST WITH CONTRAST TECHNIQUE: Multidetector CT imaging of the chest was performed using the standard protocol during bolus administration of intravenous contrast. Multiplanar CT image reconstructions and MIPs were obtained to evaluate the vascular anatomy. RADIATION DOSE REDUCTION: This exam was performed according to the departmental dose-optimization program which includes automated exposure control, adjustment of the mA and/or kV according to patient size and/or use of iterative reconstruction technique. CONTRAST:  80mL OMNIPAQUE IOHEXOL 350 MG/ML SOLN  COMPARISON:  CT scan chest from 07/31/2021. FINDINGS: Cardiovascular: No evidence of embolism to the proximal subsegmental pulmonary artery level. Normal cardiac size. No pericardial effusion. No aortic aneurysm. There are coronary artery calcifications, in keeping with coronary artery disease. There are also moderate to severe peripheral atherosclerotic vascular calcifications of thoracic aorta and its major branches. A stent noted in the proximal left subclavian artery. Mediastinum/Nodes: Visualized thyroid gland appears grossly unremarkable. No solid / cystic mediastinal masses. The esophagus is nondistended precluding optimal assessment. No axillary, mediastinal or hilar lymphadenopathy by size criteria. Lungs/Pleura: The central tracheo-bronchial tree is patent. Surgical suture noted in the right lung upper lobe. Moderate upper lobe predominant centrilobular emphysema noted. There are superimposed airspace opacities in the right upper lobe without significant volume loss, compatible with pneumonia. There is trace right pleural effusion and right fissural effusion. There are patchy opacities also in the right lung lower lobe, posteriorly. Linear areas of atelectasis/scarring noted in the left lung upper lobe. Left lung is otherwise clear. No mass or consolidation. No left pleural effusion or pneumothorax. Upper Abdomen: There is a medium-sized hiatal hernia. Visualized upper abdominal viscera within normal limits. Musculoskeletal: Right breast implant noted. The visualized soft tissues of the chest wall are grossly unremarkable. No suspicious osseous lesions. There are mild multilevel degenerative changes in the visualized spine. Review of the MIP images confirms the above findings. IMPRESSION: 1. No embolism to the proximal subsegmental pulmonary artery level. 2. Right upper lobe pneumonia. Follow-up to clearing is suggested. There is also trace right pleural effusion. There are patchy opacities in the right  lung lower lobe, which may represent pneumonia versus atelectasis. 3. Multiple other nonacute observations, as described above. Aortic Atherosclerosis (ICD10-I70.0) and Emphysema (ICD10-J43.9). Electronically Signed   By: Jules Schick M.D.   On: 05/01/2023 18:21  DG Chest Port 1 View  Result Date: 05/01/2023 CLINICAL DATA:  Cough, congestion EXAM: PORTABLE CHEST 1 VIEW COMPARISON:  Chest radiograph 06/29/2022, CTA chest 07/31/2021 FINDINGS: The cardiomediastinal silhouette is normal There is emphysema and coarsened interstitial markings in both lungs. Heterogeneous reticular opacities in the right upper lobe are new since the prior radiograph, adjacent to right upper lobe chain sutures. There is no other focal airspace opacity. There is no pulmonary edema. There is no pleural effusion or pneumothorax There is no acute osseous abnormality. Right breast surgical clips are noted. IMPRESSION: Heterogeneous reticular opacities in the right upper lobe superimposed on background emphysema is new/worsened since the prior radiograph from 06/29/2022. Findings may reflect developing scar; however, given that this is new since the prior study, infection is not excluded. Recommend follow-up radiographs in 6-8 weeks to assess for resolution, as the opacities are located adjacent to right upper lobe chain sutures and neoplasm is not excluded. Electronically Signed   By: Lesia Hausen M.D.   On: 05/01/2023 17:23   DG Shoulder Right  Result Date: 05/01/2023 CLINICAL DATA:  Pain, cough, congestion, shortness of breath. EXAM: RIGHT SHOULDER - 2+ VIEW COMPARISON:  None Available. FINDINGS: Right shoulder appears intact. No evidence of acute fracture or dislocation. No focal bone lesion or bone destruction. Soft tissues are unremarkable. Postoperative changes and scarring in the lungs. IMPRESSION: No acute bony abnormalities. Electronically Signed   By: Burman Nieves M.D.   On: 05/01/2023 17:19    Procedures Procedures     Medications Ordered in ED Medications  potassium chloride 10 mEq in 100 mL IVPB (10 mEq Intravenous New Bag/Given 05/01/23 1804)  ipratropium-albuterol (DUONEB) 0.5-2.5 (3) MG/3ML nebulizer solution 3 mL (3 mLs Nebulization Given 05/01/23 1601)  methylPREDNISolone sodium succinate (SOLU-MEDROL) 125 mg/2 mL injection 125 mg (125 mg Intravenous Given 05/01/23 1611)  acetaminophen (TYLENOL) tablet 650 mg (650 mg Oral Given 05/01/23 1601)  potassium chloride SA (KLOR-CON M) CR tablet 40 mEq (40 mEq Oral Given 05/01/23 1741)  cefTRIAXone (ROCEPHIN) 1 g in sodium chloride 0.9 % 100 mL IVPB (1 g Intravenous New Bag/Given 05/01/23 1803)  azithromycin (ZITHROMAX) tablet 500 mg (500 mg Oral Given 05/01/23 1741)  iohexol (OMNIPAQUE) 350 MG/ML injection 80 mL (80 mLs Intravenous Contrast Given 05/01/23 1751)    ED Course/ Medical Decision Making/ A&P Clinical Course as of 05/02/23 0009  Wed May 01, 2023  1649 CBC with Differential/PlateletCliffton Asters blood cell count elevated [JK]  1717 Notified that potassium is decreased at 2.7 [JK]  1730 Brain natriuretic peptide(!) Increased, doubt chf [JK]  1730 Troponin I (High Sensitivity) nl [JK]  1730 Possible infection, rul [JK]  1740 D-dimer, quantitative(!) D-dimer is slightly elevated above age-adjusted intervals [JK]  1836 CT scan without evidence of pneumonia.  Right upper lobe pneumonia noted [JK]  1901 Case discussed with Dr. Maryjean Ka regarding admission [JK]    Clinical Course User Index [JK] Linwood Dibbles, MD                             Medical Decision Making Problems Addressed: Chronic obstructive pulmonary disease, unspecified COPD type [facility]): acute illness or injury that poses a threat to life or bodily functions Community acquired pneumonia of right upper lobe of lung: acute illness or injury that poses a threat to life or bodily functions Hypokalemia: acute illness or injury  Amount and/or Complexity of Data Reviewed Labs: ordered.  Decision-making details documented in ED Course.  Radiology: ordered and independent interpretation performed.  Risk OTC drugs. Prescription drug management. Decision regarding hospitalization.   Patient presented to the ED for evaluation of shortness of breath associated with right upper chest pain.  Patient does have COPD but normally is not on oxygen.  She does have an oxygen requirement in the ED.  On exam patient did have some slight wheezing.  Suspect there is a component of COPD exacerbation.  Patient's chest x-ray is suggestive of pneumonia.  Hypokalemia noted, may be partially related to her neb treatments.  IV potassium started. Did consider PE and acute coronary syndrome.  Cardiac enzymes are reassuring.  D-dimer was elevated but a CT angiogram does not show evidence of pulmonary embolism.  It does demonstrate her pneumonia.  With the patient's oxygen requirement age and comorbidities I will consult the medical service for admission and further treatment        Final Clinical Impression(s) / ED Diagnoses Final diagnoses:  Community acquired pneumonia of right upper lobe of lung  Chronic obstructive pulmonary disease, unspecified COPD type [facility])    Rx / DC Orders ED Discharge Orders     None         Linwood Dibbles, MD 05/02/23 0009

## 2023-05-01 NOTE — H&P (Addendum)
History and Physical    Patient: [name] UJW:[id number] DOB: [birth year] DOA: 05/01/2023 DOS: the patient was seen and examined on 05/01/2023 PCP: [place], [name], MD  Patient coming from: Home  Chief Complaint:  Chief Complaint  Patient presents with   Cough   Nasal Congestion   HPI: KANITRA [name] is a 77 y.o. female with medical history significant of OPD and asthma.  Patient does not chronically use oxygen except for occasional nighttime use.  Patient generally is able to get up and ambulate and do her activities of daily living without shortness of breath.  Patient was in her state of health till approximately 4 days ago when she reports a new onset of cough scant white/clear sputum and associated shortness of breath especially with exertion such as doing activities of daily living.  However patient was able to continue doing her activities of daily living.  There is no report of presyncope leg swelling fever.  Patient has developed new right infra axillary area chest pain with coughing, deep breathing and whenever she turns or moves her right arm.  Patient came to the symptom ER today because of persistent symptoms.  Medical evaluation is sought.  ER course is notable for patient being found to be hypoxic on room air has been treated with ceftriaxone azithromycin steroids and IV biotics Review of Systems: As mentioned in the history of present illness. All other systems reviewed and are negative. 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)    05/ 2018  chronic non-occlusive DVT RLE   Chronic kidney disease    COPD, frequent exacerbations (HCC)    03-06-2018 per pt last exacerbation 12/ 2018   Coronary artery disease    cardiologist-  dr Shirlee Latch-- 03-11-2018 er cath , 80% stenosis in the ostial second diagonal    Dyspnea on minimal exertion    Emphysema/COPD (HCC)    CAT score- 17   Family history of breast cancer    Family history of colon cancer    Family history of melanoma    Family history of ovarian cancer    Family history of pancreatic cancer    Fibromyalgia 1995   GERD (gastroesophageal reflux disease)    Hiatal hernia    History of breast cancer    History of diverticulitis of colon    2002  s/p  sigmoid colectomy   History of multiple pulmonary nodules    hx RUL nodules x2  s/p right VATS w/ wedge resection 09-11-2005 and 06-14-2011  both  necrotizing granulomatous inflammation w/ cystic area of necrosis & focal calcification   History of right breast cancer oncologist-  dr [name]-- no recurrence   dx 2010--  IDC, Stage IA , ER/PR+,  (pT1c pN0) 11-09-2008 right lumpectomy;  12-18-2008 right simple mastectomy for DCIS margins;  completed chemotherapy 2010 (no radiation) and completed antiestrogen therapy   Hx of colonic polyps    Dr. 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   Nocturia    OA (osteoarthritis)    thumbs   On supplemental oxygen therapy    03-06-2018 per pt uses only at night,  checks O2 sats at  home,  stated am sat 89% after moving around average 93-94% with RA   Osteoporosis    Peripheral arterial occlusive disease (HCC) vascular-- dr chen/ dr Randie Heinz   proximal right SFA severe focal stenosis with collaterals from the PFA/  04/ 2012 occluded celiac and SMA arteries with distal reconstitution w/ patent IMA   Peripheral vascular disease (HCC)    chronic DVT RLE,  mesenteric vascular disease   Pneumonia    Right lower lobe pulmonary nodule    Chest CT 08-29-2017   Sleep apnea    Stage 3 severe COPD by GOLD classification (HCC) hx frequent exacerbations--   pulmologist-  dr Inocente Salles--  per lov note , dated 12-20-2017, oxyogen 2L is prescribed for use with  exertion (but pt only uses mostly at night), O2 sats on RA run the 80s , this day sat 86% RA and with 2L O2 sat 92%   Varicose veins of leg with swelling    varicose vein surgery - Dr. Guss Bunde   Wears glasses    Wears hearing aid in both ears    Past Surgical History:  Procedure Laterality Date   ABDOMINAL AORTOGRAM N/A 07/11/2020   Procedure: ABDOMINAL AORTOGRAM;  Surgeon: Maeola Harman, MD;  Location[facility] INVASIVE CV LAB;  Service: Cardiovascular;  Laterality: N/A;   AORTIC ARCH ANGIOGRAPHY N/A 07/11/2020   Procedure: AORTIC ARCH ANGIOGRAPHY;  Surgeon: Maeola Harman, MD;  Location: [facility] INVASIVE CV LAB;  Service: Cardiovascular;  Laterality: N/A;   BREAST EXCISIONAL BIOPSY Left 10/15/2008   BREAST LUMPECTOMY W/ NEEDLE LOCALIZATION Right 11-09-2008    dr cornett   [facility]   CARDIAC CATHETERIZATION  03-12-2011   dr Shirlee Latch   70-80% ostial stenosis in small second diagonal (appears to small for intervention),  dLAD 40-50%,  minimal luminal irregulartieis involoving LCFx and RCA,  LVEF 55%   CATARACT EXTRACTION W/ INTRAOCULAR LENS  IMPLANT, BILATERAL  2011   CYSTOSCOPY N/A 03/11/2018   Procedure: CYSTOSCOPY WITH INSTILLATION OF POST OPERATIVE EPIRUBICIN;  Surgeon: Jerilee Field, MD;  Location: [facility] ORS;  Service: Urology;  Laterality: N/A;   LEFT HEART CATH AND CORONARY ANGIOGRAPHY N/A 05/25/2020   Procedure: LEFT HEART CATH AND CORONARY ANGIOGRAPHY;  Surgeon: Lennette Bihari, MD;  Location: [facility] INVASIVE CV LAB;  Service: Cardiovascular;  Laterality: N/A;   MASTECTOMY Right    PARTIAL COLECTOMY  2002   sigmoid and appectomy (diverticulitis)   PARTIAL MASTECTOMY WITH NEEDLE LOCALIZATION Left 02/23/2013   Procedure:  LEFT PARTIAL MASTECTOMY WITH NEEDLE LOCALIZATION;  Surgeon: [name], MD;  Location: [facility];  Service: General;  Laterality: Left;   PERIPHERAL VASCULAR INTERVENTION  07/11/2020   Procedure: PERIPHERAL VASCULAR INTERVENTION;  Surgeon:  Maeola Harman, MD;  Location: [facility] INVASIVE CV LAB;  Service: Cardiovascular;;   RECONSTRUCTION BREAST W/ LATISSIMUS DORSI FLAP Right 05-30-2009   dr Odis Luster  [facility]   REDUCTION MAMMAPLASTY Left    SIMPLE MASTECTOMY Right 12-28-2008    dr Luisa Hart  [facility]   TRANSFORAMINAL LUMBAR INTERBODY FUSION W/ MIS 1 LEVEL Left 10/22/2022   Procedure: LUMBAR THREE-FOUR MINIMALLY INVASIVE TRANSFORAMINAL LUMBAR INTERBODY FUSION;  Surgeon: Jadene Pierini, MD;  Location: [facility] OR;  Service: Neurosurgery;  Laterality: Left;  3C/RM 21 to follow   TRANSTHORACIC ECHOCARDIOGRAM  07-09-2016  dr Shirlee Latch   ef 55-60%, grade 1 diastoic dysfunction/  mild TR   TRANSURETHRAL RESECTION OF BLADDER TUMOR N/A 03/11/2018   Procedure: TRANSURETHRAL RESECTION OF BLADDER TUMOR (TURBT) 2-5cm;  Surgeon: Mena Goes,  Molli Hazard, MD;  Location: [facility] ORS;  Service: Urology;  Laterality: N/A;   UPPER EXTREMITY ANGIOGRAPHY Left 07/11/2020   Procedure: Upper Extremity Angiography;  Surgeon: Maeola Harman, MD;  Location: [facility] INVASIVE CV LAB;  Service: Cardiovascular;  Laterality: Left;   VAGINAL HYSTERECTOMY  1973   VIDEO ASSISTED THORACOSCOPY (VATS)/WEDGE RESECTION Right 09-11-2005  &  06-14-2011   dr Sloan Leiter  [facility]   both RUL   Social History:  reports that she quit smoking about 6 years ago. Her smoking use included cigarettes. She started smoking about 56 years ago. She has a 12.5 pack-year smoking history. She has never used smokeless tobacco. She reports current alcohol use of about 14.0 standard drinks of alcohol per week. She reports that she does not use drugs.  Allergies  Allergen Reactions   Sinequan [Doxepin] Other (See Comments)    Kept her awake    Sulfa Antibiotics Swelling   Sulfur     Other reaction(s): Unknown    Family History  Problem Relation Age of Onset   Colon cancer Mother        colon   COPD Mother        brown lung   Hypertension Mother    Diabetes Mother    Emphysema Mother    Coronary artery  disease Father    Heart attack Father    Sudden death Father    Heart disease Father    Cancer Sister        throat   Hypertension Sister    Coronary artery disease Brother    Heart attack Brother        early 64s   Throat cancer Sister    Ovarian cancer Sister        fallopian tube cancer in her 70s   Melanoma Sister    Hypertension Sister    Pancreatic cancer Sister    Melanoma Niece        dx in her 63s   Breast cancer Niece 68    Prior to Admission medications   Medication Sig Start Date End Date Taking? Authorizing Provider  albuterol (VENTOLIN HFA) 108 (90 Base) MCG/ACT inhaler TAKE 2 PUFFS BY MOUTH EVERY 6 HOURS AS NEEDED FOR WHEEZE OR SHORTNESS OF BREATH 09/05/20   Glenford Bayley, NP  aspirin EC 81 MG tablet Take 1 tablet (81 mg total) by mouth daily. 08/21/20   Lonia Blood, MD  atorvastatin (LIPITOR) 80 MG tablet Take 1 tablet (80 mg total) by mouth daily. 04/09/23   [place], [name], MD  budesonide (PULMICORT) 0.5 MG/2ML nebulizer solution Take 2 mLs (0.5 mg total) by nebulization 2 (two) times daily. 09/15/20   Aquilla Hacker, MD  cilostazol (PLETAL) 100 MG tablet Take 1 tablet (100 mg total) by mouth 2 (two) times daily. 09/11/22   Meriam Sprague, MD  clopidogrel (PLAVIX) 75 MG tablet Take 1 tablet (75 mg total) by mouth daily. 10/27/22   Jadene Pierini, MD  cyanocobalamin (VITAMIN B12) 1000 MCG/ML injection Inject 1,000 mcg into the muscle every 30 (thirty) days.    [provider]  cyclobenzaprine (FLEXERIL) 10 MG tablet Take 1 tablet (10 mg total) by mouth 3 (three) times daily as needed for muscle spasms. 10/24/22   Jadene Pierini, MD  DUPIXENT 300 MG/2ML SOPN Inject 300 mg into the skin every 14 (fourteen) days. 01/03/21   [provider]  ezetimibe (ZETIA) 10 MG tablet Take 1 tablet (10 mg total) by mouth daily.  04/29/23   Meriam Sprague, MD  ferrous sulfate (FEROSUL) 325 (65 FE) MG tablet TAKE ONE TABLET BY MOUTH DAILY  AT 9AM WITH BREAKFAST 01/14/23   [place], [name], MD  fluticasone [facility]) 50 MCG/ACT nasal spray SPRAY 1 SPRAY IN EACH NOSTRIL TWICE DAILY (BULK) 01/14/23   [place], [name], MD  Fluticasone-Umeclidin-Vilant (TRELEGY ELLIPTA) 200-62.5-25 MCG/ACT AEPB TAKE 1 PUFF BY MOUTH EVERY DAY 12/25/21   Jetty Duhamel D, MD  furosemide (LASIX) 40 MG tablet TAKE 1 TABLET BY MOUTH TWICE  DAILY 07/31/22   [place], [name], MD  ipratropium-albuterol (DUONEB) 0.5-2.5 (3) MG/3ML SOLN Inhale 3 mLs into the lungs as directed. TID for 1 week and then use as needed Q6 hours. 11/07/21   Rolly Salter, MD  KLOR-CON M20 20 MEQ tablet TAKE 1 TABLET BY MOUTH EVERY DAY 03/25/23   [place], [name], MD  levothyroxine (SYNTHROID) 112 MCG tablet TAKE ONE-HALF TABLET BY MOUTH ON TUESDAY AND THURSDAY . TAKE 1  TABLET BY MOUTH ON MONDAY,  WEDNESDAY, AND FRIDAY , SATURDAY , AND SUNDAY 04/09/23   [place], [name], MD  LORazepam (ATIVAN) 2 MG tablet TAKE ONE TABLET BY MOUTH DAILY AT 9PM AT BEDTIME 02/04/23   [place], [name], MD  montelukast (SINGULAIR) 10 MG tablet TAKE 1 TABLET BY MOUTH EVERYDAY AT BEDTIME 04/21/21   Glenford Bayley, NP  pantoprazole (PROTONIX) 20 MG tablet Take 1 tablet (20 mg total) by mouth 2 (two) times daily before a meal. 12/25/22   [place], [name], MD  PARoxetine (PAXIL) 20 MG tablet TAKE ONE TABLET BY MOUTH DAILY AT 9AM 04/05/23   [place], [name], MD  tamsulosin (FLOMAX) 0.4 MG CAPS capsule TAKE ONE CAPSULE (0.4 MG TOTAL) BY MOUTH DAILY AT 9AM 01/14/23   [place], [name], MD  traZODone (DESYREL) 100 MG tablet TAKE ONE TABLET BY MOUTH DAILY AT 9PM AT BEDTIME 02/11/23   [place], [name], MD    Physical Exam: Vitals:   05/01/23 1630 05/01/23 1700 05/01/23 1800 05/01/23 1904  BP: 126/66 111/80 131/62   Pulse: 84 81 81   Resp: 15 18    Temp:    98.7 F (37.1 C)  TempSrc:    Oral  SpO2: 93% 92% 93%   Weight:      Height:       general: Thin lady no distress Respiratory exam: Diffusely diminished breath sounds but air  entry is vesicular without any crackles or wheezes Cardiovascular exam S1-S2 normal Abdomen soft nontender Extremities warm without edema  Data Reviewed:  Labs on Admission:  Results for orders placed or performed during the hospital encounter of 05/01/23 (from the past 24 hour(s))  SARS Coronavirus 2 by RT PCR (hospital order, performed in [facility] hospital lab) *cepheid single result test* Anterior Nasal Swab     Status: None   Collection Time: 05/01/23  3:50 PM   Specimen: Anterior Nasal Swab  Result Value Ref Range   SARS Coronavirus 2 by RT PCR NEGATIVE NEGATIVE  Basic metabolic panel     Status: Abnormal   Collection Time: 05/01/23  4:10 PM  Result Value Ref Range   Sodium 135 135 - 145 mmol/L   Potassium 2.7 (LL) 3.5 - 5.1 mmol/L   Chloride 97 (L) 98 - 111 mmol/L   CO2 28 22 - 32 mmol/L   Glucose, Bld 105 (H) 70 - 99 mg/dL   BUN 11 8 - 23 mg/dL   Creatinine, Ser 1.91 0.44 - 1.00  mg/dL   Calcium 8.7 (L) 8.9 - 10.3 mg/dL   GFR, Estimated >29 >56 mL/min   Anion gap 10 5 - 15  CBC with Differential/Platelet     Status: Abnormal   Collection Time: 05/01/23  4:10 PM  Result Value Ref Range   WBC 14.7 (H) 4.0 - 10.5 K/uL   RBC 4.60 3.87 - 5.11 MIL/uL   Hemoglobin 14.1 12.0 - 15.0 g/dL   HCT 21.3 08.6 - 57.8 %   MCV 92.6 80.0 - 100.0 fL   MCH 30.7 26.0 - 34.0 pg   MCHC 33.1 30.0 - 36.0 g/dL   RDW 46.9 62.9 - 52.8 %   Platelets 293 150 - 400 K/uL   nRBC 0.0 0.0 - 0.2 %   Neutrophils Relative % 84 %   Neutro Abs 12.5 (H) 1.7 - 7.7 K/uL   Lymphocytes Relative 4 %   Lymphs Abs 0.6 (L) 0.7 - 4.0 K/uL   Monocytes Relative 10 %   Monocytes Absolute 1.4 (H) 0.1 - 1.0 K/uL   Eosinophils Relative 1 %   Eosinophils Absolute 0.1 0.0 - 0.5 K/uL   Basophils Relative 0 %   Basophils Absolute 0.1 0.0 - 0.1 K/uL   Immature Granulocytes 1 %   Abs Immature Granulocytes 0.07 0.00 - 0.07 K/uL  Brain natriuretic peptide     Status: Abnormal   Collection Time: 05/01/23  4:10 PM   Result Value Ref Range   B Natriuretic Peptide 123.7 (H) 0.0 - 100.0 pg/mL  Troponin I (High Sensitivity)     Status: None   Collection Time: 05/01/23  4:10 PM  Result Value Ref Range   Troponin I (High Sensitivity) 14 <18 ng/L  D-dimer, quantitative     Status: Abnormal   Collection Time: 05/01/23  4:10 PM  Result Value Ref Range   D-Dimer, Quant 0.89 (H) 0.00 - 0.50 ug/mL-FEU  Troponin I (High Sensitivity)     Status: None   Collection Time: 05/01/23  6:03 PM  Result Value Ref Range   Troponin I (High Sensitivity) 15 <18 ng/L   *Note: Due to a large number of results and/or encounters for the requested time period, some results have not been displayed. A complete set of results can be found in Results Review.   Basic Metabolic Panel: Recent Labs  Lab 05/01/23 1610  NA 135  K 2.7*  CL 97*  CO2 28  GLUCOSE 105*  BUN 11  CREATININE 0.71  CALCIUM 8.7*   Liver Function Tests: No results for input(s): "AST", "ALT", "ALKPHOS", "BILITOT", "PROT", "ALBUMIN" in the last 168 hours. No results for input(s): "LIPASE", "AMYLASE" in the last 168 hours. No results for input(s): "AMMONIA" in the last 168 hours. CBC: Recent Labs  Lab 05/01/23 1610  WBC 14.7*  NEUTROABS 12.5*  HGB 14.1  HCT 42.6  MCV 92.6  PLT 293   Cardiac Enzymes: Recent Labs  Lab 05/01/23 1610 05/01/23 1803  TROPONINIHS 14 15    BNP (last 3 results) Recent Labs    06/29/22 1444  PROBNP 37.0   CBG: No results for input(s): "GLUCAP" in the last 168 hours.  Radiological Exams on Admission:  CT Angio Chest PE W and/or Wo Contrast  Result Date: 05/01/2023 CLINICAL DATA:  Pulmonary embolism (PE) suspected, low to intermediate prob, positive D-dimer EXAM: CT ANGIOGRAPHY CHEST WITH CONTRAST TECHNIQUE: Multidetector CT imaging of the chest was performed using the standard protocol during bolus administration of intravenous contrast. Multiplanar CT image reconstructions and MIPs  were obtained to evaluate  the vascular anatomy. RADIATION DOSE REDUCTION: This exam was performed according to the departmental dose-optimization program which includes automated exposure control, adjustment of the mA and/or kV according to patient size and/or use of iterative reconstruction technique. CONTRAST:  80mL OMNIPAQUE IOHEXOL 350 MG/ML SOLN COMPARISON:  CT scan chest from 07/31/2021. FINDINGS: Cardiovascular: No evidence of embolism to the proximal subsegmental pulmonary artery level. Normal cardiac size. No pericardial effusion. No aortic aneurysm. There are coronary artery calcifications, in keeping with coronary artery disease. There are also moderate to severe peripheral atherosclerotic vascular calcifications of thoracic aorta and its major branches. A stent noted in the proximal left subclavian artery. Mediastinum/Nodes: Visualized thyroid gland appears grossly unremarkable. No solid / cystic mediastinal masses. The esophagus is nondistended precluding optimal assessment. No axillary, mediastinal or hilar lymphadenopathy by size criteria. Lungs/Pleura: The central tracheo-bronchial tree is patent. Surgical suture noted in the right lung upper lobe. Moderate upper lobe predominant centrilobular emphysema noted. There are superimposed airspace opacities in the right upper lobe without significant volume loss, compatible with pneumonia. There is trace right pleural effusion and right fissural effusion. There are patchy opacities also in the right lung lower lobe, posteriorly. Linear areas of atelectasis/scarring noted in the left lung upper lobe. Left lung is otherwise clear. No mass or consolidation. No left pleural effusion or pneumothorax. Upper Abdomen: There is a medium-sized hiatal hernia. Visualized upper abdominal viscera within normal limits. Musculoskeletal: Right breast implant noted. The visualized soft tissues of the chest wall are grossly unremarkable. No suspicious osseous lesions. There are mild multilevel  degenerative changes in the visualized spine. Review of the MIP images confirms the above findings. IMPRESSION: 1. No embolism to the proximal subsegmental pulmonary artery level. 2. Right upper lobe pneumonia. Follow-up to clearing is suggested. There is also trace right pleural effusion. There are patchy opacities in the right lung lower lobe, which may represent pneumonia versus atelectasis. 3. Multiple other nonacute observations, as described above. Aortic Atherosclerosis (ICD10-I70.0) and Emphysema (ICD10-J43.9). Electronically Signed   By: Jules Schick M.D.   On: 05/01/2023 18:21   DG Chest Port 1 View  Result Date: 05/01/2023 CLINICAL DATA:  Cough, congestion EXAM: PORTABLE CHEST 1 VIEW COMPARISON:  Chest radiograph 06/29/2022, CTA chest 07/31/2021 FINDINGS: The cardiomediastinal silhouette is normal There is emphysema and coarsened interstitial markings in both lungs. Heterogeneous reticular opacities in the right upper lobe are new since the prior radiograph, adjacent to right upper lobe chain sutures. There is no other focal airspace opacity. There is no pulmonary edema. There is no pleural effusion or pneumothorax There is no acute osseous abnormality. Right breast surgical clips are noted. IMPRESSION: Heterogeneous reticular opacities in the right upper lobe superimposed on background emphysema is new/worsened since the prior radiograph from 06/29/2022. Findings may reflect developing scar; however, given that this is new since the prior study, infection is not excluded. Recommend follow-up radiographs in 6-8 weeks to assess for resolution, as the opacities are located adjacent to right upper lobe chain sutures and neoplasm is not excluded. Electronically Signed   By: Lesia Hausen M.D.   On: 05/01/2023 17:23   DG Shoulder Right  Result Date: 05/01/2023 CLINICAL DATA:  Pain, cough, congestion, shortness of breath. EXAM: RIGHT SHOULDER - 2+ VIEW COMPARISON:  None Available. FINDINGS: Right  shoulder appears intact. No evidence of acute fracture or dislocation. No focal bone lesion or bone destruction. Soft tissues are unremarkable. Postoperative changes and scarring in the lungs. IMPRESSION: No acute  bony abnormalities. Electronically Signed   By: Burman Nieves M.D.   On: 05/01/2023 17:19    EKG: Independently reviewed. Artifact nsr   Assessment and Plan: CAP (community acquired pneumonia) Right upper lobe and right lower lobe associated with pleural effusion that is small.  However there is surgical CT reported by the radiologist in the right upper lobe, please note patient does have history of surgery in the affected area.  At this time treating with ceftriaxone azithromycin.  Patient has had at least a mild exacerbation of her COPD/asthma.  Will continue with prednisone p.o. and inhaled bronchodilators and trend patient clinically.  Patient at this time not looking toxic already already has received antibiotics, no point doing blood cultures now. A/w new mild hypxia - c.w. 2lpm oxygen  Hypokalemia Check mag. Agresive repletion as patinet has nromal renal fucntion. Check again at 10 PM   Medication reconciliation pending.   Advance Care Planning:   Code Status: Prior DNR. Ok to intubate  Consults: none   Family Communication: per patient.  Severity of Illness: The appropriate patient status for this patient is INPATIENT. Inpatient status is judged to be reasonable and necessary in order to provide the required intensity of service to ensure the patient's safety. The patient's presenting symptoms, physical exam findings, and initial radiographic and laboratory data in the context of their chronic comorbidities is felt to place them at high risk for further clinical deterioration. Furthermore, it is not anticipated that the patient will be medically stable for discharge from the hospital within 2 midnights of admission.   * I certify that at the point of admission it is my  clinical judgment that the patient will require inpatient hospital care spanning beyond 2 midnights from the point of admission due to high intensity of service, high risk for further deterioration and high frequency of surveillance required.*  Author: Nolberto Hanlon, MD 05/01/2023 7:24 PM  For on call review [email or web address].

## 2023-05-01 NOTE — ED Triage Notes (Signed)
Pt states she feels SOB when walking. Productive cough with clear phlegm. C/o right shoulder pain that is worse with deep inspiration

## 2023-05-01 NOTE — Assessment & Plan Note (Signed)
Depends on oxygen for sleep and will continue 2 L through her CPAP.

## 2023-05-01 NOTE — Assessment & Plan Note (Addendum)
Right upper lobe and right lower lobe associated with pleural effusion that is small.  However there is surgical CT reported by the radiologist in the right upper lobe, please note patient does have history of surgery in the affected area.  At this time treating with ceftriaxone azithromycin.  Patient has had at least a mild exacerbation of her COPD/asthma.  Will continue with prednisone p.o. and inhaled bronchodilators and trend patient clinically.  Patient at this time not looking toxic already already has received antibiotics, no point doing blood cultures now. A/w new mild hypxia - c.w. 2lpm oxygen

## 2023-05-02 ENCOUNTER — Telehealth: Payer: Self-pay | Admitting: Family Medicine

## 2023-05-02 DIAGNOSIS — F419 Anxiety disorder, unspecified: Secondary | ICD-10-CM | POA: Diagnosis not present

## 2023-05-02 DIAGNOSIS — J189 Pneumonia, unspecified organism: Secondary | ICD-10-CM | POA: Diagnosis not present

## 2023-05-02 DIAGNOSIS — I739 Peripheral vascular disease, unspecified: Secondary | ICD-10-CM | POA: Diagnosis not present

## 2023-05-02 DIAGNOSIS — E876 Hypokalemia: Secondary | ICD-10-CM

## 2023-05-02 DIAGNOSIS — I2510 Atherosclerotic heart disease of native coronary artery without angina pectoris: Secondary | ICD-10-CM | POA: Diagnosis not present

## 2023-05-02 LAB — CBC
HCT: 41.2 % (ref 36.0–46.0)
Hemoglobin: 13.7 g/dL (ref 12.0–15.0)
MCH: 31.3 pg (ref 26.0–34.0)
MCHC: 33.3 g/dL (ref 30.0–36.0)
MCV: 94.1 fL (ref 80.0–100.0)
Platelets: 392 10*3/uL (ref 150–400)
RBC: 4.38 MIL/uL (ref 3.87–5.11)
RDW: 14.8 % (ref 11.5–15.5)
WBC: 9.3 10*3/uL (ref 4.0–10.5)
nRBC: 0 % (ref 0.0–0.2)

## 2023-05-02 LAB — BASIC METABOLIC PANEL
Anion gap: 8 (ref 5–15)
BUN: 12 mg/dL (ref 8–23)
CO2: 24 mmol/L (ref 22–32)
Calcium: 9.3 mg/dL (ref 8.9–10.3)
Chloride: 103 mmol/L (ref 98–111)
Creatinine, Ser: 0.69 mg/dL (ref 0.44–1.00)
GFR, Estimated: >60 mL/min (ref >60)
Glucose, Bld: 151 mg/dL — ABNORMAL HIGH (ref 70–99)
Potassium: 3.8 mmol/L (ref 3.5–5.1)
Sodium: 135 mmol/L (ref 135–145)

## 2023-05-02 LAB — RESPIRATORY PANEL BY PCR

## 2023-05-02 LAB — PROTIME-INR
INR: 1 (ref 0.8–1.2)
Prothrombin Time: 13.6 seconds (ref 11.4–15.2)

## 2023-05-02 LAB — TSH: TSH: 0.538 u[IU]/mL (ref 0.350–4.500)

## 2023-05-02 LAB — HEPATIC FUNCTION PANEL
ALT: 51 U/L — ABNORMAL HIGH (ref 0–44)
AST: 46 U/L — ABNORMAL HIGH (ref 15–41)
Albumin: 3.5 g/dL (ref 3.5–5.0)
Alkaline Phosphatase: 108 U/L (ref 38–126)
Bilirubin, Direct: <0.1 mg/dL (ref 0.0–0.2)
Total Bilirubin: 0.6 mg/dL (ref 0.3–1.2)
Total Protein: 7.9 g/dL (ref 6.5–8.1)

## 2023-05-02 LAB — PROCALCITONIN: Procalcitonin: 0.16 ng/mL

## 2023-05-02 LAB — APTT: aPTT: 30 seconds (ref 24–36)

## 2023-05-02 MED ORDER — GUAIFENESIN ER 600 MG PO TB12
600.0000 mg | ORAL_TABLET | Freq: Two times a day (BID) | ORAL | Status: DC
  Administered 2023-05-02 – 2023-05-06 (×9): 600 mg via ORAL
  Filled 2023-05-02 (×9): qty 1

## 2023-05-02 MED ORDER — TAMSULOSIN HCL 0.4 MG PO CAPS
0.4000 mg | ORAL_CAPSULE | Freq: Every day | ORAL | Status: DC
  Administered 2023-05-02 – 2023-05-05 (×4): 0.4 mg via ORAL
  Filled 2023-05-02 (×4): qty 1

## 2023-05-02 MED ORDER — CLOPIDOGREL BISULFATE 75 MG PO TABS
75.0000 mg | ORAL_TABLET | Freq: Every day | ORAL | Status: DC
  Administered 2023-05-02 – 2023-05-06 (×5): 75 mg via ORAL
  Filled 2023-05-02 (×5): qty 1

## 2023-05-02 MED ORDER — ENOXAPARIN SODIUM 40 MG/0.4ML IJ SOSY
40.0000 mg | PREFILLED_SYRINGE | INTRAMUSCULAR | Status: DC
  Administered 2023-05-02 – 2023-05-05 (×4): 40 mg via SUBCUTANEOUS
  Filled 2023-05-02 (×4): qty 0.4

## 2023-05-02 MED ORDER — CYCLOBENZAPRINE HCL 10 MG PO TABS: 10.0000 mg | ORAL_TABLET | Freq: Three times a day (TID) | ORAL | Status: DC | PRN

## 2023-05-02 MED ORDER — PANTOPRAZOLE SODIUM 20 MG PO TBEC
20.0000 mg | DELAYED_RELEASE_TABLET | Freq: Two times a day (BID) | ORAL | Status: DC
  Administered 2023-05-02 – 2023-05-06 (×8): 20 mg via ORAL
  Filled 2023-05-02 (×11): qty 1

## 2023-05-02 MED ORDER — EZETIMIBE 10 MG PO TABS
10.0000 mg | ORAL_TABLET | Freq: Every day | ORAL | Status: DC
  Administered 2023-05-02 – 2023-05-06 (×5): 10 mg via ORAL
  Filled 2023-05-02 (×5): qty 1

## 2023-05-02 MED ORDER — FERROUS SULFATE 325 (65 FE) MG PO TABS
325.0000 mg | ORAL_TABLET | Freq: Every day | ORAL | Status: DC
  Administered 2023-05-02 – 2023-05-06 (×5): 325 mg via ORAL
  Filled 2023-05-02 (×5): qty 1

## 2023-05-02 MED ORDER — TRAZODONE HCL 50 MG PO TABS
100.0000 mg | ORAL_TABLET | Freq: Every day | ORAL | Status: DC
  Administered 2023-05-02 – 2023-05-05 (×4): 100 mg via ORAL
  Filled 2023-05-02 (×4): qty 2

## 2023-05-02 MED ORDER — PAROXETINE HCL 20 MG PO TABS
20.0000 mg | ORAL_TABLET | Freq: Every day | ORAL | Status: DC
  Administered 2023-05-02 – 2023-05-06 (×5): 20 mg via ORAL
  Filled 2023-05-02 (×5): qty 1

## 2023-05-02 MED ORDER — MONTELUKAST SODIUM 10 MG PO TABS
10.0000 mg | ORAL_TABLET | Freq: Every day | ORAL | Status: DC
  Administered 2023-05-02 – 2023-05-05 (×4): 10 mg via ORAL
  Filled 2023-05-02 (×4): qty 1

## 2023-05-02 MED ORDER — FLUTICASONE FUROATE-VILANTEROL 200-25 MCG/ACT IN AEPB
1.0000 | INHALATION_SPRAY | Freq: Every day | RESPIRATORY_TRACT | Status: DC
  Administered 2023-05-02 – 2023-05-06 (×5): 1 via RESPIRATORY_TRACT
  Filled 2023-05-02: qty 28

## 2023-05-02 MED ORDER — FUROSEMIDE 40 MG PO TABS
40.0000 mg | ORAL_TABLET | Freq: Two times a day (BID) | ORAL | Status: DC
  Administered 2023-05-02 – 2023-05-06 (×9): 40 mg via ORAL
  Filled 2023-05-02 (×9): qty 1

## 2023-05-02 MED ORDER — UMECLIDINIUM BROMIDE 62.5 MCG/ACT IN AEPB
1.0000 | INHALATION_SPRAY | Freq: Every day | RESPIRATORY_TRACT | Status: DC
  Administered 2023-05-02 – 2023-05-06 (×5): 1 via RESPIRATORY_TRACT
  Filled 2023-05-02: qty 7

## 2023-05-02 MED ORDER — CYANOCOBALAMIN 1000 MCG/ML IJ SOLN
1000.0000 ug | INTRAMUSCULAR | Status: DC
  Administered 2023-05-02: 1000 ug via INTRAMUSCULAR
  Filled 2023-05-02: qty 1

## 2023-05-02 MED ORDER — ALBUTEROL SULFATE (2.5 MG/3ML) 0.083% IN NEBU: 2.5000 mg | INHALATION_SOLUTION | RESPIRATORY_TRACT | Status: DC | PRN

## 2023-05-02 NOTE — Progress Notes (Signed)
Pharmacy Note    Pharmacy is consulted to dose enoxaparin for VTE prophylaxis.    Pt wt 53.1 kg Scr 0.69 mg/dl, CrCl 49 ml/min   Will order enoxaparin 40 mg SQ q24h .     Dosage will likely remain stable at above dose and need for further dosage adjustment appears unlikely at present.    Will sign off at this time.  Please reconsult if a change in clinical status warrants re-evaluation of dosage.  Adalberto Cole, PharmD, BCPS 05/02/2023 10:38 AM

## 2023-05-02 NOTE — Progress Notes (Addendum)
PROGRESS NOTE    [name]  ZOX:[id number] DOB: [birth year] DOA: 05/01/2023 PCP: [place], [name], MD   Brief Narrative:   [name] is a 77 y.o. female with medical history significant of OPD and asthma.  Patient does not chronically use oxygen except for occasional nighttime use.  She came in with symptoms of shortness of breath for 4 days and was diagnosed with pneumonia as well as acute COPD exacerbation and hospitalized.  Assessment & Plan:   Active Problems:   Essential hypertension   PAD (peripheral artery disease) (HCC)   CAD (coronary artery disease)   Anxiety disorder   COPD with acute exacerbation (HCC)   Acute on chronic respiratory failure with hypoxia (HCC)   Hypokalemia   CAP (community acquired pneumonia)   GERD (gastroesophageal reflux disease)   Community acquired pneumonia  Acute on chronic hypoxic respiratory failure: Patient typically uses 2 L of oxygen at night intermittently.  Has been requiring 4 L of oxygen.  CT and x-ray consistent with right upper lobe pneumonia.  Leukocytosis resolved, she is afebrile.  Will check procalcitonin.  Will continue Rocephin and Zithromax as well as DuoNeb along with prednisone as she is still is wheezy and dyspneic.  Nicotine dependence: Counseling regarding quitting provided.  Hypokalemia: Resolved.  Hyperlipidemia: Continue statin.  Hypothyroidism: Continue Synthroid.  History of PAD: Continue aspirin, cilostazol and Plavix.  Chronic diastolic congestive heart failure: Does not appear to be volume overloaded.  BNP at her baseline.  Resume home dose of Lasix.  Anxiety disorder: Resume home medications.  DVT prophylaxis: SCDs Start: 05/01/23 1941   Code Status: DNR  Family Communication:  None present at bedside.  Plan of care discussed with patient in length and he/she verbalized understanding and agreed with it.  Status is: Inpatient Remains inpatient appropriate because: Patient still symptomatic and  hypoxic.   Estimated body mass index is 19.47 kg/m as calculated from the following:   Height as of this encounter: 5\' 5"  (1.651 m).   Weight as of this encounter: 53.1 kg.    Nutritional Assessment: Body mass index is 19.47 kg/m.Marland Kitchen Seen by dietician.  I agree with the assessment and plan as outlined below: Nutrition Status:        . Skin Assessment: I have examined the patient's skin and I agree with the wound assessment as performed by the wound care RN as outlined below:    Consultants:  None  Procedures:  None  Antimicrobials:  Anti-infectives (From admission, onward)    Start     Dose/Rate Route Frequency Ordered Stop   05/02/23 1000  cefTRIAXone (ROCEPHIN) 1 g in sodium chloride 0.9 % 100 mL IVPB        1 g 200 mL/hr over 30 Minutes Intravenous Every 24 hours 05/01/23 1931     05/02/23 1000  azithromycin (ZITHROMAX) 500 mg in sodium chloride 0.9 % 250 mL IVPB        500 mg 250 mL/hr over 60 Minutes Intravenous Every 24 hours 05/01/23 1931 05/04/23 0959   05/01/23 1745  cefTRIAXone (ROCEPHIN) 1 g in sodium chloride 0.9 % 100 mL IVPB        1 g 200 mL/hr over 30 Minutes Intravenous  Once 05/01/23 1732 05/01/23 1913   05/01/23 1745  azithromycin (ZITHROMAX) tablet 500 mg        500 mg Oral  Once 05/01/23 1732 05/01/23 1741         Subjective: Seen and examined.  Still with  shortness of breath and constant cough.  Feels only slightly better than yesterday.  Objective: Vitals:   05/02/23 0225 05/02/23 0548 05/02/23 0903 05/02/23 0931  BP:  110/73 (!) 114/51   Pulse:  76 83   Resp:  18 18   Temp:  98 F (36.7 C) 98.2 F (36.8 C)   TempSrc:  Oral Oral   SpO2: 97% 95% (!) 88% 90%  Weight:      Height:       No intake or output data in the 24 hours ending 05/02/23 1029 Filed Weights   05/01/23 1534  Weight: 53.1 kg    Examination:  General exam: Appears uncomfortable with constant coughing and dyspneic Respiratory system: Diffuse expiratory  wheezes with rhonchi in the right middle and upper lobe, slightly dyspneic. Cardiovascular system: S1 & S2 heard, RRR. No JVD, murmurs, rubs, gallops or clicks. No pedal edema. Gastrointestinal system: Abdomen is nondistended, soft and nontender. No organomegaly or masses felt. Normal bowel sounds heard. Central nervous system: Alert and oriented. No focal neurological deficits. Extremities: Symmetric 5 x 5 power. Skin: No rashes, lesions or ulcers Psychiatry: Judgement and insight appear normal. Mood & affect appropriate.    Data Reviewed: I have personally reviewed following labs and imaging studies  CBC: Recent Labs  Lab 05/01/23 1610 05/02/23 0804  WBC 14.7* 9.3  NEUTROABS 12.5*  --   HGB 14.1 13.7  HCT 42.6 41.2  MCV 92.6 94.1  PLT 293 392   Basic Metabolic Panel: Recent Labs  Lab 05/01/23 1610 05/01/23 1803 05/01/23 2213 05/02/23 0804  NA 135  --  133* 135  K 2.7*  --  3.9 3.8  CL 97*  --  103 103  CO2 28  --  23 24  GLUCOSE 105*  --  143* 151*  BUN 11  --  12 12  CREATININE 0.71  --  0.72 0.69  CALCIUM 8.7*  --  8.8* 9.3  MG  --  1.7  --   --    GFR: Estimated Creatinine Clearance: 48.6 mL/min (by C-G formula based on SCr of 0.69 mg/dL). Liver Function Tests: Recent Labs  Lab 05/02/23 0804  AST 46*  ALT 51*  ALKPHOS 108  BILITOT 0.6  PROT 7.9  ALBUMIN 3.5   No results for input(s): "LIPASE", "AMYLASE" in the last 168 hours. No results for input(s): "AMMONIA" in the last 168 hours. Coagulation Profile: Recent Labs  Lab 05/02/23 0804  INR 1.0   Cardiac Enzymes: No results for input(s): "CKTOTAL", "CKMB", "CKMBINDEX", "TROPONINI" in the last 168 hours. BNP (last 3 results) Recent Labs    06/29/22 1444  PROBNP 37.0   HbA1C: No results for input(s): "HGBA1C" in the last 72 hours. CBG: No results for input(s): "GLUCAP" in the last 168 hours. Lipid Profile: No results for input(s): "CHOL", "HDL", "LDLCALC", "TRIG", "CHOLHDL", "LDLDIRECT" in  the last 72 hours. Thyroid Function Tests: Recent Labs    05/02/23 0804  TSH 0.538   Anemia Panel: No results for input(s): "VITAMINB12", "FOLATE", "FERRITIN", "TIBC", "IRON", "RETICCTPCT" in the last 72 hours. Sepsis Labs: No results for input(s): "PROCALCITON", "LATICACIDVEN" in the last 168 hours.  Recent Results (from the past 240 hour(s))  SARS Coronavirus 2 by RT PCR (hospital order, performed in [facility] hospital lab) *cepheid single result test* Anterior Nasal Swab     Status: None   Collection Time: 05/01/23  3:50 PM   Specimen: Anterior Nasal Swab  Result Value Ref Range Status  SARS Coronavirus 2 by RT PCR NEGATIVE NEGATIVE Final    Comment: (NOTE) SARS-CoV-2 target nucleic acids are NOT DETECTED.  The SARS-CoV-2 RNA is generally detectable in upper and lower respiratory specimens during the acute phase of infection. The lowest concentration of SARS-CoV-2 viral copies this assay can detect is 250 copies / mL. A negative result does not preclude SARS-CoV-2 infection and should not be used as the sole basis for treatment or other patient management decisions.  A negative result may occur with improper specimen collection / handling, submission of specimen other than nasopharyngeal swab, presence of viral mutation(s) within the areas targeted by this assay, and inadequate number of viral copies (<250 copies / mL). A negative result must be combined with clinical observations, patient history, and epidemiological information.  Fact Sheet for Patients:   RoadLapTop.co.za  Fact Sheet for Healthcare Providers: http://kim-miller.com/  This test is not yet approved or  cleared by the [place] FDA and has been authorized for detection and/or diagnosis of SARS-CoV-2 by FDA under an Emergency Use Authorization (EUA).  This EUA will remain in effect (meaning this test can be used) for the duration of the COVID-19  declaration under Section 564(b)(1) of the Act, 21 U.S.C. section 360bbb-3(b)(1), unless the authorization is terminated or revoked sooner.  Performed at [facility], [address] [place], [place] [address]      Radiology Studies: CT Angio Chest PE W and/or Wo Contrast  Result Date: 05/01/2023 CLINICAL DATA:  Pulmonary embolism (PE) suspected, low to intermediate prob, positive D-dimer EXAM: CT ANGIOGRAPHY CHEST WITH CONTRAST TECHNIQUE: Multidetector CT imaging of the chest was performed using the standard protocol during bolus administration of intravenous contrast. Multiplanar CT image reconstructions and MIPs were obtained to evaluate the vascular anatomy. RADIATION DOSE REDUCTION: This exam was performed according to the departmental dose-optimization program which includes automated exposure control, adjustment of the mA and/or kV according to patient size and/or use of iterative reconstruction technique. CONTRAST:  80mL OMNIPAQUE IOHEXOL 350 MG/ML SOLN COMPARISON:  CT scan chest from 07/31/2021. FINDINGS: Cardiovascular: No evidence of embolism to the proximal subsegmental pulmonary artery level. Normal cardiac size. No pericardial effusion. No aortic aneurysm. There are coronary artery calcifications, in keeping with coronary artery disease. There are also moderate to severe peripheral atherosclerotic vascular calcifications of thoracic aorta and its major branches. A stent noted in the proximal left subclavian artery. Mediastinum/Nodes: Visualized thyroid gland appears grossly unremarkable. No solid / cystic mediastinal masses. The esophagus is nondistended precluding optimal assessment. No axillary, mediastinal or hilar lymphadenopathy by size criteria. Lungs/Pleura: The central tracheo-bronchial tree is patent. Surgical suture noted in the right lung upper lobe. Moderate upper lobe predominant centrilobular emphysema noted. There are superimposed airspace opacities in the  right upper lobe without significant volume loss, compatible with pneumonia. There is trace right pleural effusion and right fissural effusion. There are patchy opacities also in the right lung lower lobe, posteriorly. Linear areas of atelectasis/scarring noted in the left lung upper lobe. Left lung is otherwise clear. No mass or consolidation. No left pleural effusion or pneumothorax. Upper Abdomen: There is a medium-sized hiatal hernia. Visualized upper abdominal viscera within normal limits. Musculoskeletal: Right breast implant noted. The visualized soft tissues of the chest wall are grossly unremarkable. No suspicious osseous lesions. There are mild multilevel degenerative changes in the visualized spine. Review of the MIP images confirms the above findings. IMPRESSION: 1. No embolism to the proximal subsegmental pulmonary artery level. 2. Right upper lobe  pneumonia. Follow-up to clearing is suggested. There is also trace right pleural effusion. There are patchy opacities in the right lung lower lobe, which may represent pneumonia versus atelectasis. 3. Multiple other nonacute observations, as described above. Aortic Atherosclerosis (ICD10-I70.0) and Emphysema (ICD10-J43.9). Electronically Signed   By: Jules Schick M.D.   On: 05/01/2023 18:21   DG Chest Port 1 View  Result Date: 05/01/2023 CLINICAL DATA:  Cough, congestion EXAM: PORTABLE CHEST 1 VIEW COMPARISON:  Chest radiograph 06/29/2022, CTA chest 07/31/2021 FINDINGS: The cardiomediastinal silhouette is normal There is emphysema and coarsened interstitial markings in both lungs. Heterogeneous reticular opacities in the right upper lobe are new since the prior radiograph, adjacent to right upper lobe chain sutures. There is no other focal airspace opacity. There is no pulmonary edema. There is no pleural effusion or pneumothorax There is no acute osseous abnormality. Right breast surgical clips are noted. IMPRESSION: Heterogeneous reticular opacities  in the right upper lobe superimposed on background emphysema is new/worsened since the prior radiograph from 06/29/2022. Findings may reflect developing scar; however, given that this is new since the prior study, infection is not excluded. Recommend follow-up radiographs in 6-8 weeks to assess for resolution, as the opacities are located adjacent to right upper lobe chain sutures and neoplasm is not excluded. Electronically Signed   By: Lesia Hausen M.D.   On: 05/01/2023 17:23   DG Shoulder Right  Result Date: 05/01/2023 CLINICAL DATA:  Pain, cough, congestion, shortness of breath. EXAM: RIGHT SHOULDER - 2+ VIEW COMPARISON:  None Available. FINDINGS: Right shoulder appears intact. No evidence of acute fracture or dislocation. No focal bone lesion or bone destruction. Soft tissues are unremarkable. Postoperative changes and scarring in the lungs. IMPRESSION: No acute bony abnormalities. Electronically Signed   By: Burman Nieves M.D.   On: 05/01/2023 17:19    Scheduled Meds:  aspirin EC  81 mg Oral Daily   atorvastatin  80 mg Oral Daily   cilostazol  100 mg Oral BID   clopidogrel  75 mg Oral Daily   cyanocobalamin  1,000 mcg Intramuscular Q30 days   ezetimibe  10 mg Oral Daily   ferrous sulfate  325 mg Oral Q breakfast   fluticasone furoate-vilanterol  1 puff Inhalation Daily   And   umeclidinium bromide  1 puff Inhalation Daily   furosemide  40 mg Oral BID   guaiFENesin  600 mg Oral BID   ipratropium-albuterol  3 mL Nebulization Q6H   levothyroxine  100 mcg Oral Q0600   LORazepam  2 mg Oral QHS   montelukast  10 mg Oral QHS   pantoprazole  20 mg Oral Daily   pantoprazole  20 mg Oral BID AC   PARoxetine  20 mg Oral Daily   predniSONE  50 mg Oral Q breakfast   sodium chloride flush  3 mL Intravenous Q12H   tamsulosin  0.4 mg Oral QPC supper   traZODone  100 mg Oral QHS   Continuous Infusions:  azithromycin     cefTRIAXone (ROCEPHIN)  IV       LOS: 1 day   Hughie Closs,  MD [facility]  05/02/2023, 10:29 AM   *Please note that this is a verbal dictation therefore any spelling or grammatical errors are due to the "Dragon Medical One" system interpretation.  Please page via Amion and do not message via secure chat for urgent patient care matters. Secure chat can be used for non urgent patient care matters.  How to contact the  [facility] Attending or Consulting provider 7A - 7P or covering provider during after hours 7P -7A, for this patient?  Check the care team in [facility] and look for a) attending/consulting [facility] provider listed and b) the [facility] team listed. Page or secure chat 7A-7P. Log into [email or web address] and use [place] universal password to access. If you do not have the password, please contact the hospital operator. Locate the [facility] provider you are looking for under [facility] and page to a number that you can be directly reached. If you still have difficulty reaching the provider, please page the [facility] (Director on Call) for the Hospitalists listed on amion for assistance.

## 2023-05-02 NOTE — Plan of Care (Signed)

## 2023-05-03 DIAGNOSIS — J441 Chronic obstructive pulmonary disease with (acute) exacerbation: Secondary | ICD-10-CM | POA: Diagnosis not present

## 2023-05-03 LAB — BASIC METABOLIC PANEL
Anion gap: 8 (ref 5–15)
BUN: 16 mg/dL (ref 8–23)
CO2: 25 mmol/L (ref 22–32)
Calcium: 8.8 mg/dL — ABNORMAL LOW (ref 8.9–10.3)
Chloride: 103 mmol/L (ref 98–111)
Creatinine, Ser: 0.71 mg/dL (ref 0.44–1.00)
GFR, Estimated: >60 mL/min (ref >60)
Glucose, Bld: 108 mg/dL — ABNORMAL HIGH (ref 70–99)
Potassium: 3.3 mmol/L — ABNORMAL LOW (ref 3.5–5.1)
Sodium: 136 mmol/L (ref 135–145)

## 2023-05-03 NOTE — Telephone Encounter (Signed)
error 

## 2023-05-03 NOTE — Progress Notes (Addendum)
PROGRESS NOTE    TEXAS OBORN  MVH:[id number] DOB: 1945/11/04 DOA: 05/01/2023 PCP: [place], [name], MD   Brief Narrative:   [name] is a 77 y.o. female with medical history significant of OPD and asthma.  Patient does not chronically use oxygen except for occasional nighttime use.  She came in with symptoms of shortness of breath for 4 days and was diagnosed with pneumonia as well as acute COPD exacerbation and hospitalized.  Assessment & Plan:   Active Problems:   Essential hypertension   PAD (peripheral artery disease) (HCC)   CAD (coronary artery disease)   Anxiety disorder   COPD with acute exacerbation (HCC)   Acute on chronic respiratory failure with hypoxia (HCC)   Hypokalemia   CAP (community acquired pneumonia)   GERD (gastroesophageal reflux disease)   Community acquired pneumonia  Acute on chronic hypoxic respiratory failure: Patient typically uses 2 L of oxygen at night intermittently.  Has been requiring 4 L of oxygen.  CT and x-ray consistent with right upper lobe pneumonia.  Leukocytosis resolved, she is afebrile.   Will continue Rocephin and Zithromax as well as DuoNeb along with prednisone as she is still is wheezy and dyspneic.  Nicotine dependence: Counseling regarding quitting provided.  Hypokalemia: low again, will replace.   Hyperlipidemia: Continue statin.  Hypothyroidism: Continue Synthroid.  History of PAD: Continue aspirin, cilostazol and Plavix.  Chronic diastolic congestive heart failure: Does not appear to be volume overloaded.  BNP at her baseline.  Resume home dose of Lasix.  Anxiety disorder: continue home medications.  DVT prophylaxis: enoxaparin (LOVENOX) injection 40 mg Start: 05/02/23 2200 SCDs Start: 05/01/23 1941   Code Status: DNR  Family Communication:  husband present at bedside.  Plan of care discussed with patient in length and he/she verbalized understanding and agreed with it.  Status is: Inpatient Remains inpatient  appropriate because: Patient still symptomatic and hypoxic.   Estimated body mass index is 19.47 kg/m as calculated from the following:   Height as of this encounter: 5\' 5"  (1.651 m).   Weight as of this encounter: 53.1 kg.    Nutritional Assessment: Body mass index is 19.47 kg/m.Marland Kitchen Seen by dietician.  I agree with the assessment and plan as outlined below: Nutrition Status:        . Skin Assessment: I have examined the patient's skin and I agree with the wound assessment as performed by the wound care RN as outlined below:    Consultants:  None  Procedures:  None  Antimicrobials:  Anti-infectives (From admission, onward)    Start     Dose/Rate Route Frequency Ordered Stop   05/02/23 1000  cefTRIAXone (ROCEPHIN) 1 g in sodium chloride 0.9 % 100 mL IVPB        1 g 200 mL/hr over 30 Minutes Intravenous Every 24 hours 05/01/23 1931     05/02/23 1000  azithromycin (ZITHROMAX) 500 mg in sodium chloride 0.9 % 250 mL IVPB        500 mg 250 mL/hr over 60 Minutes Intravenous Every 24 hours 05/01/23 1931 05/04/23 0959   05/01/23 1745  cefTRIAXone (ROCEPHIN) 1 g in sodium chloride 0.9 % 100 mL IVPB        1 g 200 mL/hr over 30 Minutes Intravenous  Once 05/01/23 1732 05/01/23 1913   05/01/23 1745  azithromycin (ZITHROMAX) tablet 500 mg        500 mg Oral  Once 05/01/23 1732 05/01/23 1741  Subjective: Seen and examined. Husband at bedside. She says that she feels worse with shortness of breath and c/o heartburn and is requesting egd. Educated her on indications of egd and she understood. No other concern or complaints.   Objective: Vitals:   05/02/23 1447 05/02/23 2019 05/03/23 0612 05/03/23 0846  BP: 107/62 134/68 114/69   Pulse: (!) 103 92 79   Resp: 18  16   Temp: 97.9 F (36.6 C) 98.1 F (36.7 C) 98.2 F (36.8 C)   TempSrc: Oral Oral Oral   SpO2: 95% 94% 95% 93%  Weight:      Height:        Intake/Output Summary (Last 24 hours) at 05/03/2023 1301 Last  data filed at 05/03/2023 1002 Gross per 24 hour  Intake 120 ml  Output --  Net 120 ml   Filed Weights   05/01/23 1534  Weight: 53.1 kg    Examination:  General exam: Appears calm and comfortable  Respiratory system: Diffuse expiratory wheezes bilaterally with scattered rhonchi. Cardiovascular system: S1 & S2 heard, RRR. No JVD, murmurs, rubs, gallops or clicks. No pedal edema. Gastrointestinal system: Abdomen is nondistended, soft and nontender. No organomegaly or masses felt. Normal bowel sounds heard. Central nervous system: Alert and oriented. No focal neurological deficits. Extremities: Symmetric 5 x 5 power. Skin: No rashes, lesions or ulcers.  Psychiatry: Judgement and insight appear normal. Mood & affect appropriate.    Data Reviewed: I have personally reviewed following labs and imaging studies  CBC: Recent Labs  Lab 05/01/23 1610 05/02/23 0804  WBC 14.7* 9.3  NEUTROABS 12.5*  --   HGB 14.1 13.7  HCT 42.6 41.2  MCV 92.6 94.1  PLT 293 392   Basic Metabolic Panel: Recent Labs  Lab 05/01/23 1610 05/01/23 1803 05/01/23 2213 05/02/23 0804 05/03/23 0607  NA 135  --  133* 135 136  K 2.7*  --  3.9 3.8 3.3*  CL 97*  --  103 103 103  CO2 28  --  23 24 25   GLUCOSE 105*  --  143* 151* 108*  BUN 11  --  12 12 16   CREATININE 0.71  --  0.72 0.69 0.71  CALCIUM 8.7*  --  8.8* 9.3 8.8*  MG  --  1.7  --   --   --    GFR: Estimated Creatinine Clearance: 48.6 mL/min (by C-G formula based on SCr of 0.71 mg/dL). Liver Function Tests: Recent Labs  Lab 05/02/23 0804  AST 46*  ALT 51*  ALKPHOS 108  BILITOT 0.6  PROT 7.9  ALBUMIN 3.5   No results for input(s): "LIPASE", "AMYLASE" in the last 168 hours. No results for input(s): "AMMONIA" in the last 168 hours. Coagulation Profile: Recent Labs  Lab 05/02/23 0804  INR 1.0   Cardiac Enzymes: No results for input(s): "CKTOTAL", "CKMB", "CKMBINDEX", "TROPONINI" in the last 168 hours. BNP (last 3 results) Recent  Labs    06/29/22 1444  PROBNP 37.0   HbA1C: No results for input(s): "HGBA1C" in the last 72 hours. CBG: No results for input(s): "GLUCAP" in the last 168 hours. Lipid Profile: No results for input(s): "CHOL", "HDL", "LDLCALC", "TRIG", "CHOLHDL", "LDLDIRECT" in the last 72 hours. Thyroid Function Tests: Recent Labs    05/02/23 0804  TSH 0.538   Anemia Panel: No results for input(s): "VITAMINB12", "FOLATE", "FERRITIN", "TIBC", "IRON", "RETICCTPCT" in the last 72 hours. Sepsis Labs: Recent Labs  Lab 05/02/23 0804  PROCALCITON 0.16    Recent Results (from the  past 240 hour(s))  SARS Coronavirus 2 by RT PCR (hospital order, performed in [facility] hospital lab) *cepheid single result test* Anterior Nasal Swab     Status: None   Collection Time: 05/01/23  3:50 PM   Specimen: Anterior Nasal Swab  Result Value Ref Range Status   SARS Coronavirus 2 by RT PCR NEGATIVE NEGATIVE Final    Comment: (NOTE) SARS-CoV-2 target nucleic acids are NOT DETECTED.  The SARS-CoV-2 RNA is generally detectable in upper and lower respiratory specimens during the acute phase of infection. The lowest concentration of SARS-CoV-2 viral copies this assay can detect is 250 copies / mL. A negative result does not preclude SARS-CoV-2 infection and should not be used as the sole basis for treatment or other patient management decisions.  A negative result may occur with improper specimen collection / handling, submission of specimen other than nasopharyngeal swab, presence of viral mutation(s) within the areas targeted by this assay, and inadequate number of viral copies (<250 copies / mL). A negative result must be combined with clinical observations, patient history, and epidemiological information.  Fact Sheet for Patients:   RoadLapTop.co.za  Fact Sheet for Healthcare Providers: http://kim-miller.com/  This test is not yet approved or  cleared by the  [place] FDA and has been authorized for detection and/or diagnosis of SARS-CoV-2 by FDA under an Emergency Use Authorization (EUA).  This EUA will remain in effect (meaning this test can be used) for the duration of the COVID-19 declaration under Section 564(b)(1) of the Act, 21 U.S.C. section 360bbb-3(b)(1), unless the authorization is terminated or revoked sooner.  Performed at [facility], [address] [place], [place] [address]   Respiratory (~20 pathogens) panel by PCR     Status: None   Collection Time: 05/02/23 10:19 AM   Specimen: Nasopharyngeal Swab; Respiratory  Result Value Ref Range Status   Adenovirus NOT DETECTED NOT DETECTED Final   Coronavirus 229E NOT DETECTED NOT DETECTED Final    Comment: (NOTE) The Coronavirus on the Respiratory Panel, DOES NOT test for the novel  Coronavirus (2019 nCoV)    Coronavirus HKU1 NOT DETECTED NOT DETECTED Final   Coronavirus NL63 NOT DETECTED NOT DETECTED Final   Coronavirus OC43 NOT DETECTED NOT DETECTED Final   Metapneumovirus NOT DETECTED NOT DETECTED Final   Rhinovirus / Enterovirus NOT DETECTED NOT DETECTED Final   Influenza A NOT DETECTED NOT DETECTED Final   Influenza B NOT DETECTED NOT DETECTED Final   Parainfluenza Virus 1 NOT DETECTED NOT DETECTED Final   Parainfluenza Virus 2 NOT DETECTED NOT DETECTED Final   Parainfluenza Virus 3 NOT DETECTED NOT DETECTED Final   Parainfluenza Virus 4 NOT DETECTED NOT DETECTED Final   Respiratory Syncytial Virus NOT DETECTED NOT DETECTED Final   Bordetella pertussis NOT DETECTED NOT DETECTED Final   Bordetella Parapertussis NOT DETECTED NOT DETECTED Final   Chlamydophila pneumoniae NOT DETECTED NOT DETECTED Final   Mycoplasma pneumoniae NOT DETECTED NOT DETECTED Final    Comment: Performed at [facility], [address] [place], [place] [address]     Radiology Studies: CT Angio Chest PE W and/or Wo Contrast  Result Date: 05/01/2023 CLINICAL  DATA:  Pulmonary embolism (PE) suspected, low to intermediate prob, positive D-dimer EXAM: CT ANGIOGRAPHY CHEST WITH CONTRAST TECHNIQUE: Multidetector CT imaging of the chest was performed using the standard protocol during bolus administration of intravenous contrast. Multiplanar CT image reconstructions and MIPs were obtained to evaluate the vascular anatomy. RADIATION DOSE REDUCTION: This  exam was performed according to the departmental dose-optimization program which includes automated exposure control, adjustment of the mA and/or kV according to patient size and/or use of iterative reconstruction technique. CONTRAST:  80mL OMNIPAQUE IOHEXOL 350 MG/ML SOLN COMPARISON:  CT scan chest from 07/31/2021. FINDINGS: Cardiovascular: No evidence of embolism to the proximal subsegmental pulmonary artery level. Normal cardiac size. No pericardial effusion. No aortic aneurysm. There are coronary artery calcifications, in keeping with coronary artery disease. There are also moderate to severe peripheral atherosclerotic vascular calcifications of thoracic aorta and its major branches. A stent noted in the proximal left subclavian artery. Mediastinum/Nodes: Visualized thyroid gland appears grossly unremarkable. No solid / cystic mediastinal masses. The esophagus is nondistended precluding optimal assessment. No axillary, mediastinal or hilar lymphadenopathy by size criteria. Lungs/Pleura: The central tracheo-bronchial tree is patent. Surgical suture noted in the right lung upper lobe. Moderate upper lobe predominant centrilobular emphysema noted. There are superimposed airspace opacities in the right upper lobe without significant volume loss, compatible with pneumonia. There is trace right pleural effusion and right fissural effusion. There are patchy opacities also in the right lung lower lobe, posteriorly. Linear areas of atelectasis/scarring noted in the left lung upper lobe. Left lung is otherwise clear. No mass or  consolidation. No left pleural effusion or pneumothorax. Upper Abdomen: There is a medium-sized hiatal hernia. Visualized upper abdominal viscera within normal limits. Musculoskeletal: Right breast implant noted. The visualized soft tissues of the chest wall are grossly unremarkable. No suspicious osseous lesions. There are mild multilevel degenerative changes in the visualized spine. Review of the MIP images confirms the above findings. IMPRESSION: 1. No embolism to the proximal subsegmental pulmonary artery level. 2. Right upper lobe pneumonia. Follow-up to clearing is suggested. There is also trace right pleural effusion. There are patchy opacities in the right lung lower lobe, which may represent pneumonia versus atelectasis. 3. Multiple other nonacute observations, as described above. Aortic Atherosclerosis (ICD10-I70.0) and Emphysema (ICD10-J43.9). Electronically Signed   By: Jules Schick M.D.   On: 05/01/2023 18:21   DG Chest Port 1 View  Result Date: 05/01/2023 CLINICAL DATA:  Cough, congestion EXAM: PORTABLE CHEST 1 VIEW COMPARISON:  Chest radiograph 06/29/2022, CTA chest 07/31/2021 FINDINGS: The cardiomediastinal silhouette is normal There is emphysema and coarsened interstitial markings in both lungs. Heterogeneous reticular opacities in the right upper lobe are new since the prior radiograph, adjacent to right upper lobe chain sutures. There is no other focal airspace opacity. There is no pulmonary edema. There is no pleural effusion or pneumothorax There is no acute osseous abnormality. Right breast surgical clips are noted. IMPRESSION: Heterogeneous reticular opacities in the right upper lobe superimposed on background emphysema is new/worsened since the prior radiograph from 06/29/2022. Findings may reflect developing scar; however, given that this is new since the prior study, infection is not excluded. Recommend follow-up radiographs in 6-8 weeks to assess for resolution, as the opacities are  located adjacent to right upper lobe chain sutures and neoplasm is not excluded. Electronically Signed   By: Lesia Hausen M.D.   On: 05/01/2023 17:23   DG Shoulder Right  Result Date: 05/01/2023 CLINICAL DATA:  Pain, cough, congestion, shortness of breath. EXAM: RIGHT SHOULDER - 2+ VIEW COMPARISON:  None Available. FINDINGS: Right shoulder appears intact. No evidence of acute fracture or dislocation. No focal bone lesion or bone destruction. Soft tissues are unremarkable. Postoperative changes and scarring in the lungs. IMPRESSION: No acute bony abnormalities. Electronically Signed   By: Marisa Cyphers.D.  On: 05/01/2023 17:19    Scheduled Meds:  aspirin EC  81 mg Oral Daily   atorvastatin  80 mg Oral Daily   cilostazol  100 mg Oral BID   clopidogrel  75 mg Oral Daily   cyanocobalamin  1,000 mcg Intramuscular Q30 days   enoxaparin (LOVENOX) injection  40 mg Subcutaneous Q24H   ezetimibe  10 mg Oral Daily   ferrous sulfate  325 mg Oral Q breakfast   fluticasone furoate-vilanterol  1 puff Inhalation Daily   And   umeclidinium bromide  1 puff Inhalation Daily   furosemide  40 mg Oral BID   guaiFENesin  600 mg Oral BID   ipratropium-albuterol  3 mL Nebulization Q6H   levothyroxine  100 mcg Oral Q0600   LORazepam  2 mg Oral QHS   montelukast  10 mg Oral QHS   pantoprazole  20 mg Oral BID AC   PARoxetine  20 mg Oral Daily   predniSONE  50 mg Oral Q breakfast   sodium chloride flush  3 mL Intravenous Q12H   tamsulosin  0.4 mg Oral QPC supper   traZODone  100 mg Oral QHS   Continuous Infusions:  azithromycin 500 mg (05/02/23 1213)   cefTRIAXone (ROCEPHIN)  IV 1 g (05/03/23 1128)     LOS: 2 days   Hughie Closs, MD [facility]  05/03/2023, 1:01 PM   *Please note that this is a verbal dictation therefore any spelling or grammatical errors are due to the "Dragon Medical One" system interpretation.  Please page via Amion and do not message via secure chat for urgent patient  care matters. Secure chat can be used for non urgent patient care matters.  How to contact the [facility] Attending or Consulting provider 7A - 7P or covering provider during after hours 7P -7A, for this patient?  Check the care team in [facility] and look for a) attending/consulting TR[facility] provider listed and b) the [facility] team listed. Page or secure chat 7A-7P. Log into [email or web address] and use [facility]'s universal password to access. If you do not have the password, please contact the hospital operator. Locate the [facility] provider you are looking for under [facility] and page to a number that you can be directly reached. If you still have difficulty reaching the provider, please page the [facility] (Director on Call) for the Hospitalists listed on amion for assistance.

## 2023-05-03 NOTE — Plan of Care (Signed)

## 2023-05-03 NOTE — Progress Notes (Addendum)
SATURATION QUALIFICATIONS: (This note is used to comply with regulatory documentation for home oxygen)  Patient Saturations on Room Air at Rest = 90%  Patient Saturations on Room Air while Ambulating = 84% (started becoming short of breath)  Patient Saturations on 2 Liters of oxygen while Ambulating = 88% (had to increase to 4 liters until patient recovered, then back to 2 liters)  Please briefly explain why patient needs home oxygen:  Pneumonia. Desaturation while walking, even on 2 liters.

## 2023-05-04 DIAGNOSIS — J441 Chronic obstructive pulmonary disease with (acute) exacerbation: Secondary | ICD-10-CM | POA: Diagnosis not present

## 2023-05-04 LAB — BASIC METABOLIC PANEL
Anion gap: 8 (ref 5–15)
BUN: 13 mg/dL (ref 8–23)
CO2: 29 mmol/L (ref 22–32)
Calcium: 8.7 mg/dL — ABNORMAL LOW (ref 8.9–10.3)
Chloride: 101 mmol/L (ref 98–111)
Creatinine, Ser: 0.75 mg/dL (ref 0.44–1.00)
GFR, Estimated: >60 mL/min (ref >60)
Glucose, Bld: 150 mg/dL — ABNORMAL HIGH (ref 70–99)
Potassium: 3 mmol/L — ABNORMAL LOW (ref 3.5–5.1)
Sodium: 138 mmol/L (ref 135–145)

## 2023-05-04 MED ORDER — POTASSIUM CHLORIDE CRYS ER 20 MEQ PO TBCR
40.0000 meq | EXTENDED_RELEASE_TABLET | ORAL | Status: AC
  Administered 2023-05-04 (×2): 40 meq via ORAL
  Filled 2023-05-04 (×2): qty 2

## 2023-05-04 MED ORDER — LOPERAMIDE HCL 2 MG PO CAPS
2.0000 mg | ORAL_CAPSULE | Freq: Once | ORAL | Status: AC
  Administered 2023-05-04: 2 mg via ORAL
  Filled 2023-05-04: qty 1

## 2023-05-04 MED ORDER — METHYLPREDNISOLONE SODIUM SUCC 40 MG IJ SOLR
40.0000 mg | Freq: Two times a day (BID) | INTRAMUSCULAR | Status: DC
  Administered 2023-05-04 – 2023-05-06 (×4): 40 mg via INTRAVENOUS
  Filled 2023-05-04 (×4): qty 1

## 2023-05-04 MED ORDER — IPRATROPIUM-ALBUTEROL 0.5-2.5 (3) MG/3ML IN SOLN
3.0000 mL | Freq: Three times a day (TID) | RESPIRATORY_TRACT | Status: DC
  Administered 2023-05-05 (×3): 3 mL via RESPIRATORY_TRACT
  Filled 2023-05-04 (×3): qty 3

## 2023-05-04 MED ORDER — SACCHAROMYCES BOULARDII 250 MG PO CAPS
250.0000 mg | ORAL_CAPSULE | Freq: Two times a day (BID) | ORAL | Status: DC
  Administered 2023-05-04 – 2023-05-06 (×4): 250 mg via ORAL
  Filled 2023-05-04 (×4): qty 1

## 2023-05-04 NOTE — Progress Notes (Signed)
Mobility Specialist - Progress Note   05/04/23 1439  Oxygen Therapy  SpO2 91 %  O2 Device Nasal Cannula  O2 Flow Rate (L/min) 3 L/min  Patient Activity (if Appropriate) Ambulating  Mobility  Activity Ambulated independently in hallway  Level of Assistance Independent  Assistive Device None  Distance Ambulated (ft) 160 ft  Activity Response Tolerated well  Mobility Referral Yes  $Mobility charge 1 Mobility  Mobility Specialist Start Time (ACUTE ONLY) 0223  Mobility Specialist Stop Time (ACUTE ONLY) 0234  Mobility Specialist Time Calculation (min) (ACUTE ONLY) 11 min   Pt received EOB and agreeable to mobility. No complaints during session. Pt to bed after session with all needs met.    Pre-mobility: 106 HR, 90% SpO2 (3L [place]) During mobility: 111 HR, 91% SpO2 (3L [place]) Post-mobility: 114 HR, 90% SPO2 (3L )  Chief Technology Officer

## 2023-05-04 NOTE — Progress Notes (Signed)
PROGRESS NOTE    SHAKENNA HERRERO  WUJ:[id number] DOB: 05/26/1946 DOA: 05/01/2023 PCP: [place], [name], MD   Brief Narrative:   [name] is a 77 y.o. female with medical history significant of OPD and asthma.  Patient does not chronically use oxygen except for occasional nighttime use.  She came in with symptoms of shortness of breath for 4 days and was diagnosed with pneumonia as well as acute COPD exacerbation and hospitalized.  Assessment & Plan:   Active Problems:   Essential hypertension   PAD (peripheral artery disease) (HCC)   CAD (coronary artery disease)   Anxiety disorder   COPD with acute exacerbation (HCC)   Acute on chronic respiratory failure with hypoxia (HCC)   Hypokalemia   CAP (community acquired pneumonia)   GERD (gastroesophageal reflux disease)   Community acquired pneumonia  Acute on chronic hypoxic respiratory failure secondary to community-acquired pneumonia as well as COPD exacerbation: Patient typically uses 2 L of oxygen at night intermittently.  Has been requiring 4 L of oxygen.  CT and x-ray consistent with right upper lobe pneumonia.  Leukocytosis resolved, she is afebrile.   Will continue Rocephin and Zithromax as well as DuoNeb.  Patient has been having significant bilateral diffuse expiratory wheezes since last 2 to 3 days with significant cough and no improvement.  At this point in time, we will increase her steroids and started on IV Solu-Medrol 40 twice daily.  Nicotine dependence: Counseling regarding quitting provided.  Hypokalemia: low again, will replace.   Hyperlipidemia: Continue statin.  Hypothyroidism: Continue Synthroid.  History of PAD: Continue aspirin, cilostazol and Plavix.  Chronic diastolic congestive heart failure: Does not appear to be volume overloaded.  BNP at her baseline.  Continue home dose of Lasix.  Anxiety disorder: continue home medications.  DVT prophylaxis: enoxaparin (LOVENOX) injection 40 mg Start: 05/02/23  2200 SCDs Start: 05/01/23 1941   Code Status: DNR  Family Communication:  husband present at bedside.  Plan of care discussed with patient in length and he/she verbalized understanding and agreed with it.  Status is: Inpatient Remains inpatient appropriate because: Patient still symptomatic and hypoxic.   Estimated body mass index is 19.47 kg/m as calculated from the following:   Height as of this encounter: 5\' 5"  (1.651 m).   Weight as of this encounter: 53.1 kg.    Nutritional Assessment: Body mass index is 19.47 kg/m.Marland Kitchen Seen by dietician.  I agree with the assessment and plan as outlined below: Nutrition Status:        . Skin Assessment: I have examined the patient's skin and I agree with the wound assessment as performed by the wound care RN as outlined below:    Consultants:  None  Procedures:  None  Antimicrobials:  Anti-infectives (From admission, onward)    Start     Dose/Rate Route Frequency Ordered Stop   05/02/23 1000  cefTRIAXone (ROCEPHIN) 1 g in sodium chloride 0.9 % 100 mL IVPB        1 g 200 mL/hr over 30 Minutes Intravenous Every 24 hours 05/01/23 1931     05/02/23 1000  azithromycin (ZITHROMAX) 500 mg in sodium chloride 0.9 % 250 mL IVPB        500 mg 250 mL/hr over 60 Minutes Intravenous Every 24 hours 05/01/23 1931 05/03/23 1505   05/01/23 1745  cefTRIAXone (ROCEPHIN) 1 g in sodium chloride 0.9 % 100 mL IVPB        1 g 200 mL/hr over 30 Minutes  Intravenous  Once 05/01/23 1732 05/01/23 1913   05/01/23 1745  azithromycin (ZITHROMAX) tablet 500 mg        500 mg Oral  Once 05/01/23 1732 05/01/23 1741         Subjective: Patient seen and examined.  Patient was having excessive coughing when we entered the room, to the point that she was unable to have any conversation with [place].  Appeared very dyspneic and sick.  Objective: Vitals:   05/03/23 1730 05/03/23 2113 05/04/23 0607 05/04/23 0756  BP:  119/85 120/77   Pulse:  88 73   Resp:  16 16    Temp:  98 F (36.7 C) 98.3 F (36.8 C)   TempSrc:  Oral Oral   SpO2: 91% 96% 96% 95%  Weight:      Height:        Intake/Output Summary (Last 24 hours) at 05/04/2023 1113 Last data filed at 05/04/2023 0900 Gross per 24 hour  Intake 240 ml  Output --  Net 240 ml   Filed Weights   05/01/23 1534  Weight: 53.1 kg    Examination:  General exam: Coughing constantly and dyspneic.   Respiratory system: Dyspneic with diffuse expiratory wheezes bilaterally.  Mild rhonchi in the right middle lobe. Cardiovascular system: S1 & S2 heard, RRR. No JVD, murmurs, rubs, gallops or clicks. No pedal edema. Gastrointestinal system: Abdomen is nondistended, soft and nontender. No organomegaly or masses felt. Normal bowel sounds heard. Central nervous system: Alert and oriented. No focal neurological deficits. Extremities: Symmetric 5 x 5 power. Skin: No rashes, lesions or ulcers.  Psychiatry: Judgement and insight appear normal. Mood & affect appropriate.   Data Reviewed: I have personally reviewed following labs and imaging studies  CBC: Recent Labs  Lab 05/01/23 1610 05/02/23 0804  WBC 14.7* 9.3  NEUTROABS 12.5*  --   HGB 14.1 13.7  HCT 42.6 41.2  MCV 92.6 94.1  PLT 293 392   Basic Metabolic Panel: Recent Labs  Lab 05/01/23 1610 05/01/23 1803 05/01/23 2213 05/02/23 0804 05/03/23 0607 05/04/23 1007  NA 135  --  133* 135 136 138  K 2.7*  --  3.9 3.8 3.3* 3.0*  CL 97*  --  103 103 103 101  CO2 28  --  23 24 25 29   GLUCOSE 105*  --  143* 151* 108* 150*  BUN 11  --  12 12 16 13   CREATININE 0.71  --  0.72 0.69 0.71 0.75  CALCIUM 8.7*  --  8.8* 9.3 8.8* 8.7*  MG  --  1.7  --   --   --   --    GFR: Estimated Creatinine Clearance: 48.6 mL/min (by C-G formula based on SCr of 0.75 mg/dL). Liver Function Tests: Recent Labs  Lab 05/02/23 0804  AST 46*  ALT 51*  ALKPHOS 108  BILITOT 0.6  PROT 7.9  ALBUMIN 3.5   No results for input(s): "LIPASE", "AMYLASE" in the last 168  hours. No results for input(s): "AMMONIA" in the last 168 hours. Coagulation Profile: Recent Labs  Lab 05/02/23 0804  INR 1.0   Cardiac Enzymes: No results for input(s): "CKTOTAL", "CKMB", "CKMBINDEX", "TROPONINI" in the last 168 hours. BNP (last 3 results) Recent Labs    06/29/22 1444  PROBNP 37.0   HbA1C: No results for input(s): "HGBA1C" in the last 72 hours. CBG: No results for input(s): "GLUCAP" in the last 168 hours. Lipid Profile: No results for input(s): "CHOL", "HDL", "LDLCALC", "TRIG", "CHOLHDL", "LDLDIRECT" in the  last 72 hours. Thyroid Function Tests: Recent Labs    05/02/23 0804  TSH 0.538   Anemia Panel: No results for input(s): "VITAMINB12", "FOLATE", "FERRITIN", "TIBC", "IRON", "RETICCTPCT" in the last 72 hours. Sepsis Labs: Recent Labs  Lab 05/02/23 0804  PROCALCITON 0.16    Recent Results (from the past 240 hour(s))  SARS Coronavirus 2 by RT PCR (hospital order, performed in [facility] hospital lab) *cepheid single result test* Anterior Nasal Swab     Status: None   Collection Time: 05/01/23  3:50 PM   Specimen: Anterior Nasal Swab  Result Value Ref Range Status   SARS Coronavirus 2 by RT PCR NEGATIVE NEGATIVE Final    Comment: (NOTE) SARS-CoV-2 target nucleic acids are NOT DETECTED.  The SARS-CoV-2 RNA is generally detectable in upper and lower respiratory specimens during the acute phase of infection. The lowest concentration of SARS-CoV-2 viral copies this assay can detect is 250 copies / mL. A negative result does not preclude SARS-CoV-2 infection and should not be used as the sole basis for treatment or other patient management decisions.  A negative result may occur with improper specimen collection / handling, submission of specimen other than nasopharyngeal swab, presence of viral mutation(s) within the areas targeted by this assay, and inadequate number of viral copies (<250 copies / mL). A negative result must be combined with  clinical observations, patient history, and epidemiological information.  Fact Sheet for Patients:   RoadLapTop.co.za  Fact Sheet for Healthcare Providers: http://kim-miller.com/  This test is not yet approved or  cleared by the [place] FDA and has been authorized for detection and/or diagnosis of SARS-CoV-2 by FDA under an Emergency Use Authorization (EUA).  This EUA will remain in effect (meaning this test can be used) for the duration of the COVID-19 declaration under Section 564(b)(1) of the Act, 21 U.S.C. section 360bbb-3(b)(1), unless the authorization is terminated or revoked sooner.  Performed at [facility], [address] [place], [place] [address]   Respiratory (~20 pathogens) panel by PCR     Status: None   Collection Time: 05/02/23 10:19 AM   Specimen: Nasopharyngeal Swab; Respiratory  Result Value Ref Range Status   Adenovirus NOT DETECTED NOT DETECTED Final   Coronavirus 229E NOT DETECTED NOT DETECTED Final    Comment: (NOTE) The Coronavirus on the Respiratory Panel, DOES NOT test for the novel  Coronavirus (2019 nCoV)    Coronavirus HKU1 NOT DETECTED NOT DETECTED Final   Coronavirus NL63 NOT DETECTED NOT DETECTED Final   Coronavirus OC43 NOT DETECTED NOT DETECTED Final   Metapneumovirus NOT DETECTED NOT DETECTED Final   Rhinovirus / Enterovirus NOT DETECTED NOT DETECTED Final   Influenza A NOT DETECTED NOT DETECTED Final   Influenza B NOT DETECTED NOT DETECTED Final   Parainfluenza Virus 1 NOT DETECTED NOT DETECTED Final   Parainfluenza Virus 2 NOT DETECTED NOT DETECTED Final   Parainfluenza Virus 3 NOT DETECTED NOT DETECTED Final   Parainfluenza Virus 4 NOT DETECTED NOT DETECTED Final   Respiratory Syncytial Virus NOT DETECTED NOT DETECTED Final   Bordetella pertussis NOT DETECTED NOT DETECTED Final   Bordetella Parapertussis NOT DETECTED NOT DETECTED Final   Chlamydophila pneumoniae  NOT DETECTED NOT DETECTED Final   Mycoplasma pneumoniae NOT DETECTED NOT DETECTED Final    Comment: Performed at [facility], [address], [place], [place] [address]     Radiology Studies: No results found.  Scheduled Meds:  aspirin EC  81 mg Oral Daily  atorvastatin  80 mg Oral Daily   cilostazol  100 mg Oral BID   clopidogrel  75 mg Oral Daily   cyanocobalamin  1,000 mcg Intramuscular Q30 days   enoxaparin (LOVENOX) injection  40 mg Subcutaneous Q24H   ezetimibe  10 mg Oral Daily   ferrous sulfate  325 mg Oral Q breakfast   fluticasone furoate-vilanterol  1 puff Inhalation Daily   And   umeclidinium bromide  1 puff Inhalation Daily   furosemide  40 mg Oral BID   guaiFENesin  600 mg Oral BID   ipratropium-albuterol  3 mL Nebulization Q6H   levothyroxine  100 mcg Oral Q0600   methylPREDNISolone (SOLU-MEDROL) injection  40 mg Intravenous Q12H   montelukast  10 mg Oral QHS   pantoprazole  20 mg Oral BID AC   PARoxetine  20 mg Oral Daily   potassium chloride  40 mEq Oral Q4H   sodium chloride flush  3 mL Intravenous Q12H   tamsulosin  0.4 mg Oral QPC supper   traZODone  100 mg Oral QHS   Continuous Infusions:  cefTRIAXone (ROCEPHIN)  IV 1 g (05/04/23 0933)     LOS: 3 days   Hughie Closs, MD [facility]  05/04/2023, 11:13 AM   *Please note that this is a verbal dictation therefore any spelling or grammatical errors are due to the "Dragon Medical One" system interpretation.  Please page via Amion and do not message via secure chat for urgent patient care matters. Secure chat can be used for non urgent patient care matters.  How to contact the [facility] Attending or Consulting provider 7A - 7P or covering provider during after hours 7P -7A, for this patient?  Check the care team in [facility] and look for a) attending/consulting [facility] provider listed and b) the [facility] team listed. Page or secure chat 7A-7P. Log into [email or web address] and use [place]'s universal password to  access. If you do not have the password, please contact the hospital operator. Locate the [facility] provider you are looking for under [facility] and page to a number that you can be directly reached. If you still have difficulty reaching the provider, please page the [facility] (Director on Call) for the Hospitalists listed on amion for assistance.

## 2023-05-04 NOTE — Plan of Care (Signed)

## 2023-05-05 DIAGNOSIS — J441 Chronic obstructive pulmonary disease with (acute) exacerbation: Secondary | ICD-10-CM | POA: Diagnosis not present

## 2023-05-05 LAB — BASIC METABOLIC PANEL
Anion gap: 10 (ref 5–15)
BUN: 13 mg/dL (ref 8–23)
CO2: 27 mmol/L (ref 22–32)
Calcium: 9.1 mg/dL (ref 8.9–10.3)
Chloride: 101 mmol/L (ref 98–111)
Creatinine, Ser: 0.67 mg/dL (ref 0.44–1.00)
GFR, Estimated: >60 mL/min (ref >60)
Glucose, Bld: 154 mg/dL — ABNORMAL HIGH (ref 70–99)
Potassium: 3.3 mmol/L — ABNORMAL LOW (ref 3.5–5.1)
Sodium: 138 mmol/L (ref 135–145)

## 2023-05-05 MED ORDER — POTASSIUM CHLORIDE CRYS ER 20 MEQ PO TBCR
40.0000 meq | EXTENDED_RELEASE_TABLET | ORAL | Status: AC
  Administered 2023-05-05 (×2): 40 meq via ORAL
  Filled 2023-05-05 (×2): qty 2

## 2023-05-05 MED ORDER — SIMETHICONE 80 MG PO CHEW
80.0000 mg | CHEWABLE_TABLET | Freq: Once | ORAL | Status: AC
  Administered 2023-05-05: 80 mg via ORAL
  Filled 2023-05-05: qty 1

## 2023-05-05 MED ORDER — HYDROCODONE-ACETAMINOPHEN 5-325 MG PO TABS
1.0000 | ORAL_TABLET | Freq: Four times a day (QID) | ORAL | Status: DC | PRN
  Administered 2023-05-05: 1 via ORAL
  Filled 2023-05-05: qty 1

## 2023-05-05 MED ORDER — ACETYLCYSTEINE 20 % IN SOLN
2.0000 mL | Freq: Three times a day (TID) | RESPIRATORY_TRACT | Status: DC
  Administered 2023-05-05 – 2023-05-06 (×4): 2 mL via RESPIRATORY_TRACT
  Filled 2023-05-05 (×5): qty 4

## 2023-05-05 MED ORDER — LOPERAMIDE HCL 2 MG PO CAPS
2.0000 mg | ORAL_CAPSULE | ORAL | Status: DC | PRN
  Administered 2023-05-05 (×2): 2 mg via ORAL
  Filled 2023-05-05 (×2): qty 1

## 2023-05-05 NOTE — Progress Notes (Signed)
Mobility Specialist - Progress Note   05/05/23 1502  Mobility  Activity Ambulated with assistance in hallway  Level of Assistance Standby assist, set-up cues, supervision of patient - no hands on  Assistive Device Other (Comment) (IV Pole)  Distance Ambulated (ft) 125 ft  Activity Response Tolerated well  Mobility Referral Yes  $Mobility charge 1 Mobility  Mobility Specialist Start Time (ACUTE ONLY) 0250  Mobility Specialist Stop Time (ACUTE ONLY) 0258  Mobility Specialist Time Calculation (min) (ACUTE ONLY) 8 min   Pt received in bed and agreeable to mobility. No complaints during session. Pt to bed after session with all needs met.    [facility]

## 2023-05-05 NOTE — Plan of Care (Signed)

## 2023-05-05 NOTE — Progress Notes (Signed)
PROGRESS NOTE    [name]  ZOX:[id number] DOB: 03-Nov-1945 DOA: 05/01/2023 PCP: [place], [name], MD   Brief Narrative:   [name] is a 77 y.o. female with medical history significant of OPD and asthma.  Patient does not chronically use oxygen except for occasional nighttime use.  She came in with symptoms of shortness of breath for 4 days and was diagnosed with pneumonia as well as acute COPD exacerbation and hospitalized.  Assessment & Plan:   Active Problems:   Essential hypertension   PAD (peripheral artery disease) (HCC)   CAD (coronary artery disease)   Anxiety disorder   COPD with acute exacerbation (HCC)   Acute on chronic respiratory failure with hypoxia (HCC)   Hypokalemia   CAP (community acquired pneumonia)   GERD (gastroesophageal reflux disease)   Community acquired pneumonia  Acute on chronic hypoxic respiratory failure secondary to community-acquired pneumonia as well as COPD exacerbation: Patient typically uses 2 L of oxygen at night intermittently.  Patient appears to be much improved.  She is not coughing at all.  She is able to speak in full sentences however she still feels that she is not better anymore than yesterday.  She appears to have some component of psychosomatic complaints.  Wheezes a lot better as well.  We will continue current Solu-Medrol, bronchodilators and antibiotics.  Nicotine dependence: Counseling regarding quitting provided.  Hypokalemia: low again, will replace.   Hyperlipidemia: Continue statin.  Hypothyroidism: Continue Synthroid.  History of PAD: Continue aspirin, cilostazol and Plavix.  Chronic diastolic congestive heart failure: Does not appear to be volume overloaded.  BNP at her baseline.  Continue home dose of Lasix.  Anxiety disorder: continue home medications.  Chronic intermittent diarrhea with history of IBS: She is requesting Imodium as she has had couple of episodes of diarrhea.  I have ordered those.  DVT  prophylaxis: enoxaparin (LOVENOX) injection 40 mg Start: 05/02/23 2200 SCDs Start: 05/01/23 1941   Code Status: DNR  Family Communication:  husband present at bedside.  Plan of care discussed with patient in length and he/she verbalized understanding and agreed with it.  Status is: Inpatient Remains inpatient appropriate because: Patient still symptomatic and hypoxic.   Estimated body mass index is 19.47 kg/m as calculated from the following:   Height as of this encounter: 5\' 5"  (1.651 m).   Weight as of this encounter: 53.1 kg.    Nutritional Assessment: Body mass index is 19.47 kg/m.Marland Kitchen Seen by dietician.  I agree with the assessment and plan as outlined below: Nutrition Status:        . Skin Assessment: I have examined the patient's skin and I agree with the wound assessment as performed by the wound care RN as outlined below:    Consultants:  None  Procedures:  None  Antimicrobials:  Anti-infectives (From admission, onward)    Start     Dose/Rate Route Frequency Ordered Stop   05/02/23 1000  cefTRIAXone (ROCEPHIN) 1 g in sodium chloride 0.9 % 100 mL IVPB        1 g 200 mL/hr over 30 Minutes Intravenous Every 24 hours 05/01/23 1931     05/02/23 1000  azithromycin (ZITHROMAX) 500 mg in sodium chloride 0.9 % 250 mL IVPB        500 mg 250 mL/hr over 60 Minutes Intravenous Every 24 hours 05/01/23 1931 05/03/23 1505   05/01/23 1745  cefTRIAXone (ROCEPHIN) 1 g in sodium chloride 0.9 % 100 mL IVPB  1 g 200 mL/hr over 30 Minutes Intravenous  Once 05/01/23 1732 05/01/23 1913   05/01/23 1745  azithromycin (ZITHROMAX) tablet 500 mg        500 mg Oral  Once 05/01/23 1732 05/01/23 1741         Subjective: Seen and examined.  She still feels that she is not any better however she is looking a lot better.  Yesterday, she could not speak to me without coughing or taking a break to take breath and today she is able to speak in full sentences.  She has no more audible  wheezes as she had yesterday.  Objective: Vitals:   05/04/23 1744 05/04/23 2130 05/05/23 0550 05/05/23 0637  BP:  137/87 (!) 134/98   Pulse:  91 76   Resp:  20 18   Temp:  98.3 F (36.8 C) 97.7 F (36.5 C)   TempSrc:  Oral Oral   SpO2: 93% 90% 91% 97%  Weight:      Height:        Intake/Output Summary (Last 24 hours) at 05/05/2023 1247 Last data filed at 05/04/2023 1812 Gross per 24 hour  Intake 553.33 ml  Output --  Net 553.33 ml   Filed Weights   05/01/23 1534  Weight: 53.1 kg    Examination:  General exam: Appears calm and comfortable  Respiratory system: Diffuse expiratory wheezes bilaterally. Cardiovascular system: S1 & S2 heard, RRR. No JVD, murmurs, rubs, gallops or clicks. No pedal edema. Gastrointestinal system: Abdomen is nondistended, soft and nontender. No organomegaly or masses felt. Normal bowel sounds heard. Central nervous system: Alert and oriented. No focal neurological deficits. Extremities: Symmetric 5 x 5 power. Skin: No rashes, lesions or ulcers.  Psychiatry: Judgement and insight appear normal. Mood & affect appropriate.    Data Reviewed: I have personally reviewed following labs and imaging studies  CBC: Recent Labs  Lab 05/01/23 1610 05/02/23 0804  WBC 14.7* 9.3  NEUTROABS 12.5*  --   HGB 14.1 13.7  HCT 42.6 41.2  MCV 92.6 94.1  PLT 293 392   Basic Metabolic Panel: Recent Labs  Lab 05/01/23 1803 05/01/23 2213 05/02/23 0804 05/03/23 0607 05/04/23 1007 05/05/23 0847  NA  --  133* 135 136 138 138  K  --  3.9 3.8 3.3* 3.0* 3.3*  CL  --  103 103 103 101 101  CO2  --  23 24 25 29 27   GLUCOSE  --  143* 151* 108* 150* 154*  BUN  --  12 12 16 13 13   CREATININE  --  0.72 0.69 0.71 0.75 0.67  CALCIUM  --  8.8* 9.3 8.8* 8.7* 9.1  MG 1.7  --   --   --   --   --    GFR: Estimated Creatinine Clearance: 48.6 mL/min (by C-G formula based on SCr of 0.67 mg/dL). Liver Function Tests: Recent Labs  Lab 05/02/23 0804  AST 46*  ALT 51*   ALKPHOS 108  BILITOT 0.6  PROT 7.9  ALBUMIN 3.5   No results for input(s): "LIPASE", "AMYLASE" in the last 168 hours. No results for input(s): "AMMONIA" in the last 168 hours. Coagulation Profile: Recent Labs  Lab 05/02/23 0804  INR 1.0   Cardiac Enzymes: No results for input(s): "CKTOTAL", "CKMB", "CKMBINDEX", "TROPONINI" in the last 168 hours. BNP (last 3 results) Recent Labs    06/29/22 1444  PROBNP 37.0   HbA1C: No results for input(s): "HGBA1C" in the last 72 hours. CBG: No results  for input(s): "GLUCAP" in the last 168 hours. Lipid Profile: No results for input(s): "CHOL", "HDL", "LDLCALC", "TRIG", "CHOLHDL", "LDLDIRECT" in the last 72 hours. Thyroid Function Tests: No results for input(s): "TSH", "T4TOTAL", "FREET4", "T3FREE", "THYROIDAB" in the last 72 hours.  Anemia Panel: No results for input(s): "VITAMINB12", "FOLATE", "FERRITIN", "TIBC", "IRON", "RETICCTPCT" in the last 72 hours. Sepsis Labs: Recent Labs  Lab 05/02/23 0804  PROCALCITON 0.16    Recent Results (from the past 240 hour(s))  SARS Coronavirus 2 by RT PCR (hospital order, performed in [facility] hospital lab) *cepheid single result test* Anterior Nasal Swab     Status: None   Collection Time: 05/01/23  3:50 PM   Specimen: Anterior Nasal Swab  Result Value Ref Range Status   SARS Coronavirus 2 by RT PCR NEGATIVE NEGATIVE Final    Comment: (NOTE) SARS-CoV-2 target nucleic acids are NOT DETECTED.  The SARS-CoV-2 RNA is generally detectable in upper and lower respiratory specimens during the acute phase of infection. The lowest concentration of SARS-CoV-2 viral copies this assay can detect is 250 copies / mL. A negative result does not preclude SARS-CoV-2 infection and should not be used as the sole basis for treatment or other patient management decisions.  A negative result may occur with improper specimen collection / handling, submission of specimen other than nasopharyngeal swab,  presence of viral mutation(s) within the areas targeted by this assay, and inadequate number of viral copies (<250 copies / mL). A negative result must be combined with clinical observations, patient history, and epidemiological information.  Fact Sheet for Patients:   RoadLapTop.co.za  Fact Sheet for Healthcare Providers: http://kim-miller.com/  This test is not yet approved or  cleared by the [place] FDA and has been authorized for detection and/or diagnosis of SARS-CoV-2 by FDA under an Emergency Use Authorization (EUA).  This EUA will remain in effect (meaning this test can be used) for the duration of the COVID-19 declaration under Section 564(b)(1) of the Act, 21 U.S.C. section 360bbb-3(b)(1), unless the authorization is terminated or revoked sooner.  Performed at [facility], [address] [place], [place] [address]   Respiratory (~20 pathogens) panel by PCR     Status: None   Collection Time: 05/02/23 10:19 AM   Specimen: Nasopharyngeal Swab; Respiratory  Result Value Ref Range Status   Adenovirus NOT DETECTED NOT DETECTED Final   Coronavirus 229E NOT DETECTED NOT DETECTED Final    Comment: (NOTE) The Coronavirus on the Respiratory Panel, DOES NOT test for the novel  Coronavirus (2019 nCoV)    Coronavirus HKU1 NOT DETECTED NOT DETECTED Final   Coronavirus NL63 NOT DETECTED NOT DETECTED Final   Coronavirus OC43 NOT DETECTED NOT DETECTED Final   Metapneumovirus NOT DETECTED NOT DETECTED Final   Rhinovirus / Enterovirus NOT DETECTED NOT DETECTED Final   Influenza A NOT DETECTED NOT DETECTED Final   Influenza B NOT DETECTED NOT DETECTED Final   Parainfluenza Virus 1 NOT DETECTED NOT DETECTED Final   Parainfluenza Virus 2 NOT DETECTED NOT DETECTED Final   Parainfluenza Virus 3 NOT DETECTED NOT DETECTED Final   Parainfluenza Virus 4 NOT DETECTED NOT DETECTED Final   Respiratory Syncytial Virus NOT  DETECTED NOT DETECTED Final   Bordetella pertussis NOT DETECTED NOT DETECTED Final   Bordetella Parapertussis NOT DETECTED NOT DETECTED Final   Chlamydophila pneumoniae NOT DETECTED NOT DETECTED Final   Mycoplasma pneumoniae NOT DETECTED NOT DETECTED Final    Comment: Performed at [facility], [address], [place], [place] [address]     Radiology Studies: No results found.  Scheduled Meds:  aspirin EC  81 mg Oral Daily   atorvastatin  80 mg Oral Daily   cilostazol  100 mg Oral BID   clopidogrel  75 mg Oral Daily   cyanocobalamin  1,000 mcg Intramuscular Q30 days   enoxaparin (LOVENOX) injection  40 mg Subcutaneous Q24H   ezetimibe  10 mg Oral Daily   ferrous sulfate  325 mg Oral Q breakfast   fluticasone furoate-vilanterol  1 puff Inhalation Daily   And   umeclidinium bromide  1 puff Inhalation Daily   furosemide  40 mg Oral BID   guaiFENesin  600 mg Oral BID   ipratropium-albuterol  3 mL Nebulization TID   levothyroxine  100 mcg Oral Q0600   methylPREDNISolone (SOLU-MEDROL) injection  40 mg Intravenous Q12H   montelukast  10 mg Oral QHS   pantoprazole  20 mg Oral BID AC   PARoxetine  20 mg Oral Daily   potassium chloride  40 mEq Oral Q4H   saccharomyces boulardii  250 mg Oral BID   sodium chloride flush  3 mL Intravenous Q12H   tamsulosin  0.4 mg Oral QPC supper   traZODone  100 mg Oral QHS   Continuous Infusions:  cefTRIAXone (ROCEPHIN)  IV 1 g (05/05/23 1035)     LOS: 4 days   Hughie Closs, MD [facility]  05/05/2023, 12:47 PM   *Please note that this is a verbal dictation therefore any spelling or grammatical errors are due to the "Dragon Medical One" system interpretation.  Please page via Amion and do not message via secure chat for urgent patient care matters. Secure chat can be used for non urgent patient care matters.  How to contact the [facility] Attending or Consulting provider 7A - 7P or covering provider during after hours 7P -7A, for this  patient?  Check the care team in [facility] and look for a) attending/consulting TRH provider listed and b) the [facility] team listed. Page or secure chat 7A-7P. Log into [email or web address] and use [place] universal password to access. If you do not have the password, please contact the hospital operator. Locate the [facility] provider you are looking for under [facility] and page to a number that you can be directly reached. If you still have difficulty reaching the provider, please page the [facility] (Director on Call) for the Hospitalists listed on amion for assistance.

## 2023-05-06 ENCOUNTER — Encounter (HOSPITAL_COMMUNITY): Payer: Self-pay | Admitting: Internal Medicine

## 2023-05-06 ENCOUNTER — Inpatient Hospital Stay (HOSPITAL_COMMUNITY): Payer: 59

## 2023-05-06 DIAGNOSIS — J441 Chronic obstructive pulmonary disease with (acute) exacerbation: Secondary | ICD-10-CM | POA: Diagnosis not present

## 2023-05-06 LAB — BASIC METABOLIC PANEL
Anion gap: 12 (ref 5–15)
BUN: 17 mg/dL (ref 8–23)
CO2: 27 mmol/L (ref 22–32)
Calcium: 9.4 mg/dL (ref 8.9–10.3)
Chloride: 97 mmol/L — ABNORMAL LOW (ref 98–111)
Creatinine, Ser: 0.79 mg/dL (ref 0.44–1.00)
GFR, Estimated: >60 mL/min (ref >60)
Glucose, Bld: 93 mg/dL (ref 70–99)
Potassium: 4.5 mmol/L (ref 3.5–5.1)
Sodium: 136 mmol/L (ref 135–145)

## 2023-05-06 MED ORDER — PREDNISONE 50 MG PO TABS: 50.0000 mg | ORAL_TABLET | Freq: Every day | ORAL | 0 refills | Status: AC

## 2023-05-06 MED ORDER — NYSTATIN 100000 UNIT/ML MT SUSP
5.0000 mL | Freq: Four times a day (QID) | OROMUCOSAL | 0 refills | Status: AC
  Filled 2023-05-08: qty 200, 10d supply, fill #0
  Filled 2023-05-08: qty 100, 5d supply, fill #0

## 2023-05-06 MED ORDER — ALBUTEROL SULFATE (2.5 MG/3ML) 0.083% IN NEBU
2.5000 mg | INHALATION_SOLUTION | Freq: Three times a day (TID) | RESPIRATORY_TRACT | Status: DC
  Administered 2023-05-06 (×2): 2.5 mg via RESPIRATORY_TRACT
  Filled 2023-05-06 (×3): qty 3

## 2023-05-06 NOTE — TOC Transition Note (Signed)
Transition of Care [facility]) - CM/SW Discharge Note   Patient Details  Name: [name] MRN: [id number] Date of Birth: 1946/05/13  [facility]) CM/SW Contact:  Beckie Busing, RN Phone Number:[phone]  05/06/2023, 3:45 PM   Clinical Narrative:    Patient with discharge orders. Currently there are no TOC needs. TOC will sign off.    Final next level of care: Home/Self Care Barriers to Discharge: No Barriers Identified   Patient Goals and CMS Choice CMS Medicare.gov Compare Post Acute Care list provided to::  (n/a) Choice offered to / list presented to : NA  Discharge Placement                         Discharge Plan and Services Additional resources added to the After Visit Summary for                  DME Arranged: N/A DME Agency: [employer]       HH Arranged: NA HH Agency: NA        Social Determinants of Health (SDOH) Interventions SDOH Screenings   Food Insecurity: No Food Insecurity (05/01/2023)  Housing: Low Risk  (05/01/2023)  Transportation Needs: No Transportation Needs (05/01/2023)  Utilities: Not At Risk (05/01/2023)  Alcohol Screen: Low Risk  (02/25/2023)  Depression (PHQ2-9): Low Risk  (02/25/2023)  Financial Resource Strain: Low Risk  (02/25/2023)  Physical Activity: Inactive (02/25/2023)  Social Connections: Moderately Isolated (02/25/2023)  Stress: No Stress Concern Present (02/25/2023)  Tobacco Use: Medium Risk (05/01/2023)     Readmission Risk Interventions    05/06/2023    3:39 PM 08/02/2021   11:16 AM  Readmission Risk Prevention Plan  Transportation Screening Complete Complete  PCP or Specialist Appt within 5-7 Days Complete   Home Care Screening Complete   Medication Review (RN CM) Complete   Medication Review (RN Care Manager)  Complete  PCP or Specialist appointment within 3-5 days of discharge  Complete  HRI or Home Care Consult  Complete  SW Recovery Care/Counseling Consult  Complete  Palliative Care Screening   Not Applicable  Skilled Nursing Facility  Not Applicable

## 2023-05-06 NOTE — Progress Notes (Signed)
Mobility Specialist - Progress Note   05/06/23 1043  Oxygen Therapy  SpO2 95 %  O2 Device Nasal Cannula  O2 Flow Rate (L/min) 2 L/min  Patient Activity (if Appropriate) Ambulating  Mobility  Activity Ambulated independently in hallway  Level of Assistance Independent  Assistive Device None  Distance Ambulated (ft) 160 ft  Activity Response Tolerated well  Mobility Referral Yes  $Mobility charge 1 Mobility  Mobility Specialist Start Time (ACUTE ONLY) 1034  Mobility Specialist Stop Time (ACUTE ONLY) 1040  Mobility Specialist Time Calculation (min) (ACUTE ONLY) 6 min   Pt received in bed and agreeable to mobility. Upon returning to room pt desat to 88%. Encouraged patient to sit & take pursed lip breaths allowing O2 to come back up to 95% in a matter of seconds. No complaints during session. Pt to bed after session with all needs met.    Pre-mobility: 97%  SpO2 (2L [place]) During mobility: 95% SpO2 (2L [place]) Post-mobility: 88%-95% SPO2 (2L [place])  Chief Technology Officer

## 2023-05-06 NOTE — Progress Notes (Signed)
   05/06/23 1540  TOC Brief Assessment  Insurance and Status Reviewed  Patient has primary care physician Yes  Home environment has been reviewed yes  Prior level of function: independent with home O2  Prior/Current Home Services No current home services  Social Determinants of Health Reivew SDOH reviewed no interventions necessary  Readmission risk has been reviewed Yes  Transition of care needs no transition of care needs at this time

## 2023-05-06 NOTE — Discharge Summary (Addendum)
Physician Discharge Summary  [name] ZOX:[id number] DOB: [birth year] DOA: 05/01/2023  PCP: [place], [name], MD  Admit date: 05/01/2023 Discharge date: 05/06/2023 30 Day Unplanned Readmission Risk Score    Flowsheet Row ED to Hosp-Admission (Current) from 05/01/2023 in [place] [facility]  30 Day Unplanned Readmission Risk Score (%) 20.17 Filed at 05/06/2023 0801       This score is the patient's risk of an unplanned readmission within 30 days of being discharged (0 -100%). The score is based on dignosis, age, lab data, medications, orders, and past utilization.   Low:  0-14.9   Medium: 15-21.9   High: 22-29.9   Extreme: 30 and above          Admitted From: Home  Disposition: Home  Recommendations for Outpatient Follow-up:  Follow up with PCP in 1-2 weeks Please obtain BMP/CBC in one week Please follow up with your PCP on the following pending results: Unresulted Labs (From admission, onward)    None         Home Health: None Equipment/Devices: None  Discharge Condition: Stable CODE STATUS: DNR Diet recommendation: Low-sodium  Subjective: Seen and examined.  She says that her breathing is better and she is back at baseline.  She expressed her frustration stating that she does not think that her real issue is being addressed.  When I asked her to elaborate further.  She said that she basically came to the emergency department because she was having abdominal pain and chest pain.  She further said that her chest pain has resolved but no one has addressed her abdominal pain.  I had a lengthy discussion with her about her complaints.  I reiterated that yesterday was the only time that she had complained some reflux symptoms and some diarrhea but she also mentioned that she has history of IBS.  She requested Imodium which was prescribed to her.  Prior to that, I have been seeing her for the last 4 days and she never mentioned anything about her abdomen and every time I  saw her, her only complaint was shortness of breath and she was wheezy for most part.  I also showed her the H&P which also indicates that she came in with nasal congestion and chest pain and there was no mention of any abdominal or GI complaints.  Patient was not willing to listen or understand anything and remained frustrated and asked me to discharge her since her respiratory symptoms were feeling good.  She further told me that " I have only 1 artery which supplies blood to my spleen and liver and the other 1 is 100% blocked and every time I have symptoms like these, I know I have some problems".  I further asked who is managing her GI issues.  She told me that she used to to see Dr. Cleone Slim Buccini.  However he has retired almost 19 months ago and patient said that she has not seen any GI doctors since then.  I also went through all the notes in the epic and there is not even a single GI note.  I also went through patient's PCPs note extensively and there is no mention of any GI issue other than GERD for which patient was receiving PPI.  She also believed that she had " water in the belly" however she is very thin and lean lady with soft abdomen.  Her BNP is also much better than her baseline and she was receiving Lasix IV every  day as well.  Despite of multiple attempts to try to address her concerns, patient remained frustrated and requested discharge because she is planning to go to GI doctors office after discharge.  Brief/Interim Summary: [name] is a 77 y.o. female with medical history significant of OPD and asthma.  Patient does not chronically use oxygen except for occasional nighttime use.  She came in with symptoms of shortness of breath for 4 days and was diagnosed with pneumonia as well as acute COPD exacerbation and hospitalized.  Acute on chronic hypoxic respiratory failure secondary to community-acquired pneumonia as well as COPD exacerbation: Patient typically uses 2 L of oxygen at night  intermittently.  Initially she was having diffuse wheezes and unable to speak in full sentences, she was getting p.o. prednisone and we transitioned her to IV prednisone, continued bronchodilators and added Mucomyst.  Patient is now feeling much better.  She has no audible wheezes.  Overall wheezing has improved.  We did ambulatory oximetry and she is requiring 2 L of oxygen to maintain her oxygen saturation over 88%, which is her baseline.  From COPD standpoint she appears to be stable.  For pneumonia, she has received Rocephin for 5 days and no further antibiotics are indicated.  For COPD however I am discharging her on 3 more days of oral prednisone.   Nicotine dependence: Counseling regarding quitting provided.   Hypokalemia: Resolved.   Hyperlipidemia: Continue statin.   Hypothyroidism: Continue Synthroid.   History of PAD: Continue aspirin, cilostazol and Plavix.   Chronic diastolic congestive heart failure: Does not appear to be volume overloaded.  BNP at her baseline.  Continue home dose of Lasix.   Anxiety disorder: continue home medications.  Her anxiety appears to be contributing majorly in her behavior today.   Chronic intermittent diarrhea with history of IBS: However this is not documented in any of the notes, not even in her PCPs note.  Unsure whether she does have a history or not.  Addendum: As mentioned above, per patient's intense demand, I discharged her in the morning however early afternoon, I received a message from patient's primary nurse that patient now wanted to appeal her discharge because she felt that she was not ready for discharge.  Since this was totally contradictory to what she had asked this morning, I went to see her at the bedside and she said that although she told me that she wanted to go home but now she does not think she is ready to go home because " I still have abdominal pain and I have fluid in my belly" although on examination, her abdomen was soft,  nontender with no fluid shift.  What she was calling " fluid" was actually abdominal fold fat.  I asked her what I can do to make her feel better.  She said do some imaging studies.  I offered her CT abdomen and she was okay with that.  She said that she would be happy to go home if CT abdomen will be negative.  CT abdomen was ordered right away however it was not completed until just after 5 PM.  Interestingly, patient left right after CT scan was done while we were still waiting for the results.  Discharge plan was discussed with patient and/or family member and they verbalized understanding and agreed with it.  Discharge Diagnoses:  Active Problems:   Essential hypertension   PAD (peripheral artery disease) (HCC)   CAD (coronary artery disease)   Anxiety disorder  COPD with acute exacerbation (HCC)   Acute on chronic respiratory failure with hypoxia (HCC)   Hypokalemia   CAP (community acquired pneumonia)   GERD (gastroesophageal reflux disease)   Community acquired pneumonia    Discharge Instructions   Allergies as of 05/06/2023       Reactions   Sinequan [doxepin] Other (See Comments)   Kept her awake    Sulfa Antibiotics Swelling   Sulfur    Other reaction(s): Unknown        Medication List     STOP taking these medications    cyanocobalamin 1000 MCG/ML injection Commonly known as: VITAMIN B12   cyclobenzaprine 10 MG tablet Commonly known as: FLEXERIL       TAKE these medications    albuterol 108 (90 Base) MCG/ACT inhaler Commonly known as: VENTOLIN HFA TAKE 2 PUFFS BY MOUTH EVERY 6 HOURS AS NEEDED FOR WHEEZE OR SHORTNESS OF BREATH What changed: See the new instructions.   aspirin EC 81 MG tablet Take 1 tablet (81 mg total) by mouth daily.   atorvastatin 80 MG tablet Commonly known as: LIPITOR Take 1 tablet (80 mg total) by mouth daily.   budesonide 0.5 MG/2ML nebulizer solution Commonly known as: PULMICORT Take 2 mLs (0.5 mg total) by nebulization  2 (two) times daily.   cilostazol 100 MG tablet Commonly known as: PLETAL Take 1 tablet (100 mg total) by mouth 2 (two) times daily.   clopidogrel 75 MG tablet Commonly known as: PLAVIX Take 1 tablet (75 mg total) by mouth daily.   Dupixent 300 MG/2ML Sopn Generic drug: Dupilumab Inject 300 mg into the skin every 14 (fourteen) days.   ezetimibe 10 MG tablet Commonly known as: ZETIA Take 1 tablet (10 mg total) by mouth daily.   FeroSul 325 (65 Fe) MG tablet Generic drug: ferrous sulfate TAKE ONE TABLET BY MOUTH DAILY AT 9AM WITH BREAKFAST What changed: See the new instructions.   fluticasone 50 MCG/ACT nasal spray Commonly known as: FLONASE SPRAY 1 SPRAY IN EACH NOSTRIL TWICE DAILY (BULK) What changed:  how much to take how to take this when to take this   furosemide 40 MG tablet Commonly known as: LASIX TAKE 1 TABLET BY MOUTH TWICE  DAILY   hydrOXYzine 25 MG tablet Commonly known as: ATARAX Take 25 mg by mouth at bedtime.   ipratropium-albuterol 0.5-2.5 (3) MG/3ML Soln Commonly known as: DUONEB Inhale 3 mLs into the lungs as directed. TID for 1 week and then use as needed Q6 hours.   Klor-Con M20 20 MEQ tablet Generic drug: potassium chloride SA TAKE 1 TABLET BY MOUTH EVERY DAY   levothyroxine 112 MCG tablet Commonly known as: SYNTHROID TAKE ONE-HALF TABLET BY MOUTH ON TUESDAY AND THURSDAY . TAKE 1  TABLET BY MOUTH ON MONDAY,  WEDNESDAY, AND FRIDAY , SATURDAY , AND SUNDAY What changed:  how much to take how to take this when to take this   LORazepam 2 MG tablet Commonly known as: ATIVAN TAKE ONE TABLET BY MOUTH DAILY AT 9PM AT BEDTIME What changed: See the new instructions.   montelukast 10 MG tablet Commonly known as: SINGULAIR TAKE 1 TABLET BY MOUTH EVERYDAY AT BEDTIME What changed: See the new instructions.   pantoprazole 20 MG tablet Commonly known as: PROTONIX Take 1 tablet (20 mg total) by mouth 2 (two) times daily before a meal.   PARoxetine  20 MG tablet Commonly known as: PAXIL TAKE ONE TABLET BY MOUTH DAILY AT 9AM What changed: See the new  instructions.   predniSONE 50 MG tablet Commonly known as: DELTASONE Take 1 tablet (50 mg total) by mouth daily with breakfast for 3 days.   tamsulosin 0.4 MG Caps capsule Commonly known as: FLOMAX TAKE ONE CAPSULE (0.4 MG TOTAL) BY MOUTH DAILY AT 9AM What changed: See the new instructions.   traZODone 100 MG tablet Commonly known as: DESYREL TAKE ONE TABLET BY MOUTH DAILY AT 9PM AT BEDTIME What changed:  how much to take how to take this when to take this additional instructions   Trelegy Ellipta 200-62.5-25 MCG/ACT Aepb Generic drug: Fluticasone-Umeclidin-Vilant TAKE 1 PUFF BY MOUTH EVERY DAY What changed: See the new instructions.        Follow-up Information     [place], [name], MD Follow up in 1 week(s).   Specialty: Family Medicine Contact information: [address] [place] [address] [phone]                Allergies  Allergen Reactions   Sinequan [Doxepin] Other (See Comments)    Kept her awake    Sulfa Antibiotics Swelling   Sulfur     Other reaction(s): Unknown    Consultations: None   Procedures/Studies: CT Angio Chest PE W and/or Wo Contrast  Result Date: 05/01/2023 CLINICAL DATA:  Pulmonary embolism (PE) suspected, low to intermediate prob, positive D-dimer EXAM: CT ANGIOGRAPHY CHEST WITH CONTRAST TECHNIQUE: Multidetector CT imaging of the chest was performed using the standard protocol during bolus administration of intravenous contrast. Multiplanar CT image reconstructions and MIPs were obtained to evaluate the vascular anatomy. RADIATION DOSE REDUCTION: This exam was performed according to the departmental dose-optimization program which includes automated exposure control, adjustment of the mA and/or kV according to patient size and/or use of iterative reconstruction technique. CONTRAST:  80mL OMNIPAQUE IOHEXOL 350 MG/ML  SOLN COMPARISON:  CT scan chest from 07/31/2021. FINDINGS: Cardiovascular: No evidence of embolism to the proximal subsegmental pulmonary artery level. Normal cardiac size. No pericardial effusion. No aortic aneurysm. There are coronary artery calcifications, in keeping with coronary artery disease. There are also moderate to severe peripheral atherosclerotic vascular calcifications of thoracic aorta and its major branches. A stent noted in the proximal left subclavian artery. Mediastinum/Nodes: Visualized thyroid gland appears grossly unremarkable. No solid / cystic mediastinal masses. The esophagus is nondistended precluding optimal assessment. No axillary, mediastinal or hilar lymphadenopathy by size criteria. Lungs/Pleura: The central tracheo-bronchial tree is patent. Surgical suture noted in the right lung upper lobe. Moderate upper lobe predominant centrilobular emphysema noted. There are superimposed airspace opacities in the right upper lobe without significant volume loss, compatible with pneumonia. There is trace right pleural effusion and right fissural effusion. There are patchy opacities also in the right lung lower lobe, posteriorly. Linear areas of atelectasis/scarring noted in the left lung upper lobe. Left lung is otherwise clear. No mass or consolidation. No left pleural effusion or pneumothorax. Upper Abdomen: There is a medium-sized hiatal hernia. Visualized upper abdominal viscera within normal limits. Musculoskeletal: Right breast implant noted. The visualized soft tissues of the chest wall are grossly unremarkable. No suspicious osseous lesions. There are mild multilevel degenerative changes in the visualized spine. Review of the MIP images confirms the above findings. IMPRESSION: 1. No embolism to the proximal subsegmental pulmonary artery level. 2. Right upper lobe pneumonia. Follow-up to clearing is suggested. There is also trace right pleural effusion. There are patchy opacities in the  right lung lower lobe, which may represent pneumonia versus atelectasis. 3. Multiple other nonacute observations, as  described above. Aortic Atherosclerosis (ICD10-I70.0) and Emphysema (ICD10-J43.9). Electronically Signed   By: Jules Schick M.D.   On: 05/01/2023 18:21   DG Chest Port 1 View  Result Date: 05/01/2023 CLINICAL DATA:  Cough, congestion EXAM: PORTABLE CHEST 1 VIEW COMPARISON:  Chest radiograph 06/29/2022, CTA chest 07/31/2021 FINDINGS: The cardiomediastinal silhouette is normal There is emphysema and coarsened interstitial markings in both lungs. Heterogeneous reticular opacities in the right upper lobe are new since the prior radiograph, adjacent to right upper lobe chain sutures. There is no other focal airspace opacity. There is no pulmonary edema. There is no pleural effusion or pneumothorax There is no acute osseous abnormality. Right breast surgical clips are noted. IMPRESSION: Heterogeneous reticular opacities in the right upper lobe superimposed on background emphysema is new/worsened since the prior radiograph from 06/29/2022. Findings may reflect developing scar; however, given that this is new since the prior study, infection is not excluded. Recommend follow-up radiographs in 6-8 weeks to assess for resolution, as the opacities are located adjacent to right upper lobe chain sutures and neoplasm is not excluded. Electronically Signed   By: Lesia Hausen M.D.   On: 05/01/2023 17:23   DG Shoulder Right  Result Date: 05/01/2023 CLINICAL DATA:  Pain, cough, congestion, shortness of breath. EXAM: RIGHT SHOULDER - 2+ VIEW COMPARISON:  None Available. FINDINGS: Right shoulder appears intact. No evidence of acute fracture or dislocation. No focal bone lesion or bone destruction. Soft tissues are unremarkable. Postoperative changes and scarring in the lungs. IMPRESSION: No acute bony abnormalities. Electronically Signed   By: Burman Nieves M.D.   On: 05/01/2023 17:19     Discharge  Exam: Vitals:   05/06/23 1035 05/06/23 1043  BP:    Pulse:    Resp:    Temp:    SpO2: 96% 95%   Vitals:   05/06/23 0700 05/06/23 0853 05/06/23 1035 05/06/23 1043  BP: 118/83     Pulse: 73     Resp: 17     Temp: 98.4 F (36.9 C)     TempSrc: Oral     SpO2: 94% 92% 96% 95%  Weight:      Height:        General: Pt is alert, awake, not in acute distress Cardiovascular: RRR, S1/S2 +, no rubs, no gallops Respiratory: Bilateral expiratory wheezes, much improved compared to yesterday.  No rhonchi. Abdominal: Soft, NT, ND, bowel sounds + Extremities: no edema, no cyanosis    The results of significant diagnostics from this hospitalization (including imaging, microbiology, ancillary and laboratory) are listed below for reference.     Microbiology: Recent Results (from the past 240 hour(s))  SARS Coronavirus 2 by RT PCR (hospital order, performed in [facility] hospital lab) *cepheid single result test* Anterior Nasal Swab     Status: None   Collection Time: 05/01/23  3:50 PM   Specimen: Anterior Nasal Swab  Result Value Ref Range Status   SARS Coronavirus 2 by RT PCR NEGATIVE NEGATIVE Final    Comment: (NOTE) SARS-CoV-2 target nucleic acids are NOT DETECTED.  The SARS-CoV-2 RNA is generally detectable in upper and lower respiratory specimens during the acute phase of infection. The lowest concentration of SARS-CoV-2 viral copies this assay can detect is 250 copies / mL. A negative result does not preclude SARS-CoV-2 infection and should not be used as the sole basis for treatment or other patient management decisions.  A negative result may occur with improper specimen collection / handling, submission of specimen other than  nasopharyngeal swab, presence of viral mutation(s) within the areas targeted by this assay, and inadequate number of viral copies (<250 copies / mL). A negative result must be combined with clinical observations, patient history, and epidemiological  information.  Fact Sheet for Patients:   RoadLapTop.co.za  Fact Sheet for Healthcare Providers: http://kim-miller.com/  This test is not yet approved or  cleared by the [place] FDA and has been authorized for detection and/or diagnosis of SARS-CoV-2 by FDA under an Emergency Use Authorization (EUA).  This EUA will remain in effect (meaning this test can be used) for the duration of the COVID-19 declaration under Section 564(b)(1) of the Act, 21 U.S.C. section 360bbb-3(b)(1), unless the authorization is terminated or revoked sooner.  Performed at [facility], [address], [place], [place] [address]   Respiratory (~20 pathogens) panel by PCR     Status: None   Collection Time: 05/02/23 10:19 AM   Specimen: Nasopharyngeal Swab; Respiratory  Result Value Ref Range Status   Adenovirus NOT DETECTED NOT DETECTED Final   Coronavirus 229E NOT DETECTED NOT DETECTED Final    Comment: (NOTE) The Coronavirus on the Respiratory Panel, DOES NOT test for the novel  Coronavirus (2019 nCoV)    Coronavirus HKU1 NOT DETECTED NOT DETECTED Final   Coronavirus NL63 NOT DETECTED NOT DETECTED Final   Coronavirus OC43 NOT DETECTED NOT DETECTED Final   Metapneumovirus NOT DETECTED NOT DETECTED Final   Rhinovirus / Enterovirus NOT DETECTED NOT DETECTED Final   Influenza A NOT DETECTED NOT DETECTED Final   Influenza B NOT DETECTED NOT DETECTED Final   Parainfluenza Virus 1 NOT DETECTED NOT DETECTED Final   Parainfluenza Virus 2 NOT DETECTED NOT DETECTED Final   Parainfluenza Virus 3 NOT DETECTED NOT DETECTED Final   Parainfluenza Virus 4 NOT DETECTED NOT DETECTED Final   Respiratory Syncytial Virus NOT DETECTED NOT DETECTED Final   Bordetella pertussis NOT DETECTED NOT DETECTED Final   Bordetella Parapertussis NOT DETECTED NOT DETECTED Final   Chlamydophila pneumoniae NOT DETECTED NOT DETECTED Final   Mycoplasma pneumoniae NOT  DETECTED NOT DETECTED Final    Comment: Performed at [facility], [address] [place], [place] [address]     Labs: BNP (last 3 results) Recent Labs    05/01/23 1610  BNP 123.7*   Basic Metabolic Panel: Recent Labs  Lab 05/01/23 1803 05/01/23 2213 05/02/23 0804 05/03/23 0607 05/04/23 1007 05/05/23 0847 05/06/23 0935  NA  --    < > 135 136 138 138 136  K  --    < > 3.8 3.3* 3.0* 3.3* 4.5  CL  --    < > 103 103 101 101 97*  CO2  --    < > 24 25 29 27 27   GLUCOSE  --    < > 151* 108* 150* 154* 93  BUN  --    < > 12 16 13 13 17   CREATININE  --    < > 0.69 0.71 0.75 0.67 0.79  CALCIUM  --    < > 9.3 8.8* 8.7* 9.1 9.4  MG 1.7  --   --   --   --   --   --    < > = values in this interval not displayed.   Liver Function Tests: Recent Labs  Lab 05/02/23 0804  AST 46*  ALT 51*  ALKPHOS 108  BILITOT 0.6  PROT 7.9  ALBUMIN 3.5   No results for input(s): "LIPASE", "AMYLASE"  in the last 168 hours. No results for input(s): "AMMONIA" in the last 168 hours. CBC: Recent Labs  Lab 05/01/23 1610 05/02/23 0804  WBC 14.7* 9.3  NEUTROABS 12.5*  --   HGB 14.1 13.7  HCT 42.6 41.2  MCV 92.6 94.1  PLT 293 392   Cardiac Enzymes: No results for input(s): "CKTOTAL", "CKMB", "CKMBINDEX", "TROPONINI" in the last 168 hours. BNP: Invalid input(s): "POCBNP" CBG: No results for input(s): "GLUCAP" in the last 168 hours. D-Dimer No results for input(s): "DDIMER" in the last 72 hours. Hgb A1c No results for input(s): "HGBA1C" in the last 72 hours. Lipid Profile No results for input(s): "CHOL", "HDL", "LDLCALC", "TRIG", "CHOLHDL", "LDLDIRECT" in the last 72 hours. Thyroid function studies No results for input(s): "TSH", "T4TOTAL", "T3FREE", "THYROIDAB" in the last 72 hours.  Invalid input(s): "FREET3" Anemia work up No results for input(s): "VITAMINB12", "FOLATE", "FERRITIN", "TIBC", "IRON", "RETICCTPCT" in the last 72 hours. Urinalysis    Component Value Date/Time    COLORURINE YELLOW 07/31/2021 1244   APPEARANCEUR CLEAR 07/31/2021 1244   LABSPEC 1.011 07/31/2021 1244   LABSPEC 1.030 03/08/2009 1016   PHURINE 5.0 07/31/2021 1244   GLUCOSEU NEGATIVE 07/31/2021 1244   GLUCOSEU NEGATIVE 09/07/2009 0752   HGBUR NEGATIVE 07/31/2021 1244   HGBUR negative 11/09/2010 0958   BILIRUBINUR NEGATIVE 07/31/2021 1244   BILIRUBINUR Negative 03/08/2009 1016   KETONESUR NEGATIVE 07/31/2021 1244   PROTEINUR NEGATIVE 07/31/2021 1244   UROBILINOGEN 0.2 03/23/2013 0248   NITRITE NEGATIVE 07/31/2021 1244   LEUKOCYTESUR TRACE (A) 07/31/2021 1244   LEUKOCYTESUR Negative 03/08/2009 1016   Sepsis Labs Recent Labs  Lab 05/01/23 1610 05/02/23 0804  WBC 14.7* 9.3   Microbiology Recent Results (from the past 240 hour(s))  SARS Coronavirus 2 by RT PCR (hospital order, performed in [facility] hospital lab) *cepheid single result test* Anterior Nasal Swab     Status: None   Collection Time: 05/01/23  3:50 PM   Specimen: Anterior Nasal Swab  Result Value Ref Range Status   SARS Coronavirus 2 by RT PCR NEGATIVE NEGATIVE Final    Comment: (NOTE) SARS-CoV-2 target nucleic acids are NOT DETECTED.  The SARS-CoV-2 RNA is generally detectable in upper and lower respiratory specimens during the acute phase of infection. The lowest concentration of SARS-CoV-2 viral copies this assay can detect is 250 copies / mL. A negative result does not preclude SARS-CoV-2 infection and should not be used as the sole basis for treatment or other patient management decisions.  A negative result may occur with improper specimen collection / handling, submission of specimen other than nasopharyngeal swab, presence of viral mutation(s) within the areas targeted by this assay, and inadequate number of viral copies (<250 copies / mL). A negative result must be combined with clinical observations, patient history, and epidemiological information.  Fact Sheet for Patients:    RoadLapTop.co.za  Fact Sheet for Healthcare Providers: http://kim-miller.com/  This test is not yet approved or  cleared by the [place] FDA and has been authorized for detection and/or diagnosis of SARS-CoV-2 by FDA under an Emergency Use Authorization (EUA).  This EUA will remain in effect (meaning this test can be used) for the duration of the COVID-19 declaration under Section 564(b)(1) of the Act, 21 U.S.C. section 360bbb-3(b)(1), unless the authorization is terminated or revoked sooner.  Performed at [facility], [address] [place], [place] [address]   Respiratory (~20 pathogens) panel by PCR     Status: None   Collection Time:  05/02/23 10:19 AM   Specimen: Nasopharyngeal Swab; Respiratory  Result Value Ref Range Status   Adenovirus NOT DETECTED NOT DETECTED Final   Coronavirus 229E NOT DETECTED NOT DETECTED Final    Comment: (NOTE) The Coronavirus on the Respiratory Panel, DOES NOT test for the novel  Coronavirus (2019 nCoV)    Coronavirus HKU1 NOT DETECTED NOT DETECTED Final   Coronavirus NL63 NOT DETECTED NOT DETECTED Final   Coronavirus OC43 NOT DETECTED NOT DETECTED Final   Metapneumovirus NOT DETECTED NOT DETECTED Final   Rhinovirus / Enterovirus NOT DETECTED NOT DETECTED Final   Influenza A NOT DETECTED NOT DETECTED Final   Influenza B NOT DETECTED NOT DETECTED Final   Parainfluenza Virus 1 NOT DETECTED NOT DETECTED Final   Parainfluenza Virus 2 NOT DETECTED NOT DETECTED Final   Parainfluenza Virus 3 NOT DETECTED NOT DETECTED Final   Parainfluenza Virus 4 NOT DETECTED NOT DETECTED Final   Respiratory Syncytial Virus NOT DETECTED NOT DETECTED Final   Bordetella pertussis NOT DETECTED NOT DETECTED Final   Bordetella Parapertussis NOT DETECTED NOT DETECTED Final   Chlamydophila pneumoniae NOT DETECTED NOT DETECTED Final   Mycoplasma pneumoniae NOT DETECTED NOT DETECTED Final    Comment:  Performed at [facility], [address], [place], [place] [address]    FURTHER DISCHARGE INSTRUCTIONS:   Get Medicines reviewed and adjusted: Please take all your medications with you for your next visit with your Primary MD   Laboratory/radiological data: Please request your Primary MD to go over all hospital tests and procedure/radiological results at the follow up, please ask your Primary MD to get all Hospital records sent to his/her office.   In some cases, they will be blood work, cultures and biopsy results pending at the time of your discharge. Please request that your primary care M.D. goes through all the records of your hospital data and follows up on these results.   Also Note the following: If you experience worsening of your admission symptoms, develop shortness of breath, life threatening emergency, suicidal or homicidal thoughts you must seek medical attention immediately by calling 911 or calling your MD immediately  if symptoms less severe.   You must read complete instructions/literature along with all the possible adverse reactions/side effects for all the Medicines you take and that have been prescribed to you. Take any new Medicines after you have completely understood and accpet all the possible adverse reactions/side effects.    Do not drive when taking Pain medications or sleeping medications (Benzodaizepines)   Do not take more than prescribed Pain, Sleep and Anxiety Medications. It is not advisable to combine anxiety,sleep and pain medications without talking with your primary care practitioner   Special Instructions: If you have smoked or chewed Tobacco  in the last 2 yrs please stop smoking, stop any regular Alcohol  and or any Recreational drug use.   Wear Seat belts while driving.   Please note: You were cared for by a hospitalist during your hospital stay. Once you are discharged, your primary care physician will handle any further medical issues.  Please note that NO REFILLS for any discharge medications will be authorized once you are discharged, as it is imperative that you return to your primary care physician (or establish a relationship with a primary care physician if you do not have one) for your post hospital discharge needs so that they can reassess your need for medications and monitor your lab values  Time coordinating discharge: Over 30 minutes  SIGNED:   Hughie Closs, MD  [facility] 05/06/2023, 11:15 AM *Please note that this is a verbal dictation therefore any spelling or grammatical errors are due to the "Dragon [employer] One" system interpretation. If 7PM-7AM, please contact night-coverage [email or web address]

## 2023-05-06 NOTE — Progress Notes (Addendum)
Received her on 4 liters oxygen at 0700, weaned down  to 2 liters at 0935.  At 1230 - oxygen saturation is 94% on 2 liters (baseline).  Still has chronic cough, but skin color improved and not so pale anymore.  Patient has discharge orders. She states "I do not feel that I am ready for discharge because I am still very sick".   MD notified.  She complains of stomach issues, pain and swelling.  MD comes to bedside and discusses with patient. She agrees that if CT scan abdomen is negative - then she will be ready for discharge.  Addendum at 1740: Ct Scan is complete. Patient states "I am ready to go, but now I have thrush". Looked in her mouth and she does have some white pustules. Notified MD. He ordered/sent an Rx to her pharmacy for nystatin.    Brought gauze into room to take out her IV for discharge, but she states "I already removed it myself". Gauze applied anyway.   She does not want to wear oxygen for the ride home. Patient left with her husband.

## 2023-05-08 ENCOUNTER — Other Ambulatory Visit (HOSPITAL_COMMUNITY): Payer: Self-pay

## 2023-05-08 ENCOUNTER — Telehealth: Payer: Self-pay | Admitting: *Deleted

## 2023-05-08 NOTE — Transitions of Care (Post Inpatient/ED Visit) (Signed)
05/08/2023  Name: [name] MRN: [id number] DOB: Mar 07, 1946  Today's TOC FU Call Status: Today's TOC FU Call Status:: Successful TOC FU Call Competed TOC FU Call Complete Date: 05/08/23  Transition Care Management Follow-up Telephone Call Date of Discharge: 05/06/23 Discharge Facility: [facility]) Type of Discharge: Inpatient Admission Primary Inpatient Discharge Diagnosis:: community acquired pneumonia How have you been since you were released from the hospital?: Better (but my mouth is so sore) Any questions or concerns?: Yes Patient Questions/Concerns:: Patient went to pharmacy and they did not give her the nystatin. RN called pharmacy and it is on back order. RN checked other pharmacy and on backorder. [facility]. ready in an hr. Patient Questions/Concerns Addressed: Other:  Items Reviewed: Did you receive and understand the discharge instructions provided?: Yes Medications obtained,verified, and reconciled?: Yes (Medications Reviewed) Any new allergies since your discharge?: No Dietary orders reviewed?: No Do you have support at home?: Yes People in Home: spouse Name of Support/Comfort Primary Source: Ronnie  Medications Reviewed Today: Medications Reviewed Today     Reviewed by Luella Cook, RN (Case Manager) on 05/08/23 at 1046  Med List Status: <None>   Medication Order Taking? Sig Documenting Provider Last Dose Status Informant  albuterol (VENTOLIN HFA) 108 (90 Base) MCG/ACT inhaler [id number] Yes TAKE 2 PUFFS BY MOUTH EVERY 6 HOURS AS NEEDED FOR WHEEZE OR SHORTNESS OF BREATH  Patient taking differently: Inhale 2 puffs into the lungs every 6 (six) hours as needed for shortness of breath.   Glenford Bayley, NP Taking Active Self, Pharmacy Records  aspirin EC 81 MG tablet [id number] Yes Take 1 tablet (81 mg total) by mouth daily. Lonia Blood, MD Taking Active Self, Pharmacy Records  atorvastatin (LIPITOR) 80  MG tablet [id number] Yes Take 1 tablet (80 mg total) by mouth daily. [place], [name], MD Taking Active Self, Pharmacy Records  budesonide (PULMICORT) 0.5 MG/2ML nebulizer solution [id number] Yes Take 2 mLs (0.5 mg total) by nebulization 2 (two) times daily. Aquilla Hacker, MD Taking Active Self, Pharmacy Records  cilostazol (PLETAL) 100 MG tablet [id number] Yes Take 1 tablet (100 mg total) by mouth 2 (two) times daily. Meriam Sprague, MD Taking Active Self, Pharmacy Records  clopidogrel (PLAVIX) 75 MG tablet [id number] Yes Take 1 tablet (75 mg total) by mouth daily. Jadene Pierini, MD Taking Active Self, Pharmacy Records  DUPIXENT 300 MG/2ML SOPN [id number] Yes Inject 300 mg into the skin every 14 (fourteen) days. [provider] Taking Active Self, Pharmacy Records  ezetimibe (ZETIA) 10 MG tablet [id number] Yes Take 1 tablet (10 mg total) by mouth daily. Meriam Sprague, MD Taking Active Self, Pharmacy Records  ferrous sulfate (FEROSUL) 325 (65 FE) MG tablet [id number] Yes TAKE ONE TABLET BY MOUTH DAILY AT 9AM WITH BREAKFAST  Patient taking differently: Take 325 mg by mouth daily with breakfast.   [place], [name], MD Taking Active Self, Pharmacy Records  fluticasone [facility]) 50 MCG/ACT nasal spray [id number] Yes SPRAY 1 SPRAY IN EACH NOSTRIL TWICE DAILY (BULK)  Patient taking differently: Place 1 spray into both nostrils 2 (two) times daily. SPRAY 1 SPRAY IN EACH NOSTRIL TWICE DAILY (BULK)   [place], [name], MD Taking Active Self, Pharmacy Records  Fluticasone-Umeclidin-[facility] ELLIPTA) 200-62.5-25 MCG/ACT AEPB [id number] Yes TAKE 1 PUFF BY MOUTH EVERY DAY  Patient taking differently: Inhale 1 puff into the lungs daily.   Waymon Budge, MD Taking Active Self, Pharmacy Records  furosemide (LASIX) 40 MG tablet [id number] Yes TAKE 1 TABLET BY MOUTH TWICE  DAILY [place], [name], MD Taking Active Self, Pharmacy Records  hydrOXYzine (ATARAX) 25 MG tablet [id number] Yes Take 25  mg by mouth at bedtime. [provider] Taking Active Self, Pharmacy Records  ipratropium-albuterol (DUONEB) 0.5-2.5 (3) MG/3ML SOLN [id number] Yes Inhale 3 mLs into the lungs as directed. TID for 1 week and then use as needed Q6 hours. Rolly Salter, MD Taking Active Self, Pharmacy Records  KLOR-CON M20 20 MEQ tablet [id number] Yes TAKE 1 TABLET BY MOUTH EVERY DAY [place], [name], MD Taking Active Self, Pharmacy Records  levothyroxine (SYNTHROID) 112 MCG tablet [id number] Yes TAKE ONE-HALF TABLET BY MOUTH ON TUESDAY AND THURSDAY . TAKE 1  TABLET BY MOUTH ON MONDAY,  WEDNESDAY, AND FRIDAY , SATURDAY , AND SUNDAY  Patient taking differently: Take 112 mcg by mouth See admin instructions. TAKE ONE-HALF TABLET BY MOUTH ON TUESDAY AND THURSDAY . TAKE 1  TABLET BY MOUTH ON MONDAY,  WEDNESDAY, AND FRIDAY , SATURDAY , AND SUNDAY   [place], [name], MD Taking Active Self, Pharmacy Records  LORazepam (ATIVAN) 2 MG tablet [id number] Yes TAKE ONE TABLET BY MOUTH DAILY AT 9PM AT BEDTIME  Patient taking differently: Take 2 mg by mouth at bedtime.   [place], [name], MD Taking Active Self, Pharmacy Records  montelukast (SINGULAIR) 10 MG tablet [id number]  TAKE 1 TABLET BY MOUTH EVERYDAY AT BEDTIME  Patient taking differently: Take 10 mg by mouth at bedtime.   Glenford Bayley, NP  Active Self, Pharmacy Records  nystatin (MYCOSTATIN) 100000 UNIT/ML suspension [id number]  Take 5 mLs (500,000 Units total) by mouth 4 (four) times daily for 10 days. Hughie Closs, MD  Active            Med Note Ian Malkin, Dennard Schaumann   Wed May 08, 2023 10:46 AM) Back order. [facility]  pantoprazole (PROTONIX) 20 MG tablet [id number] Yes Take 1 tablet (20 mg total) by mouth 2 (two) times daily before a meal. [place], [name], MD Taking Active Self, Pharmacy Records  PARoxetine (PAXIL) 20 MG tablet [id number] Yes TAKE ONE TABLET BY MOUTH DAILY AT 9AM  Patient taking differently: Take 20 mg by mouth daily.   [place],  [name], MD Taking Active Self, Pharmacy Records  predniSONE (DELTASONE) 50 MG tablet [id number] Yes Take 1 tablet (50 mg total) by mouth daily with breakfast for 3 days. Hughie Closs, MD Taking Active   tamsulosin (FLOMAX) 0.4 MG CAPS capsule [id number] Yes TAKE ONE CAPSULE (0.4 MG TOTAL) BY MOUTH DAILY AT 9AM  Patient taking differently: Take 0.4 mg by mouth daily after breakfast.   [place], [name], MD Taking Active Self, Pharmacy Records  traZODone (DESYREL) 100 MG tablet [id number] Yes TAKE ONE TABLET BY MOUTH DAILY AT 9PM AT BEDTIME  Patient taking differently: Take 100 mg by mouth at bedtime.   [place], [name], MD Taking Active Self, Pharmacy Records            Home Care and Equipment/Supplies: Were Home Health Services Ordered?: NA Any new equipment or medical supplies ordered?: NA  Functional Questionnaire: Do you need assistance with bathing/showering or dressing?: No Do you need assistance with meal preparation?: Yes Do you need assistance with eating?: No Do you have difficulty maintaining continence: No Do you need assistance with getting out of bed/getting out of a chair/moving?: No Do you have difficulty managing or taking your medications?: No  Follow up appointments reviewed: PCP Follow-up appointment confirmed?: Yes Date of PCP follow-up appointment?: 05/13/23 Follow-up Provider: [facility] [place] [facility] Follow-up appointment confirmed?: NA Do you need transportation to your follow-up appointment?: No Do you understand care options if your condition(s) worsen?: Yes-patient verbalized understanding  SDOH Interventions Today    Flowsheet Row Most Recent Value  SDOH Interventions   Food Insecurity Interventions Intervention Not Indicated  Housing Interventions Intervention Not Indicated  Transportation Interventions Intervention Not Indicated, Patient Resources (Friends/Family)      Interventions Today    Flowsheet Row Most Recent Value  General  Interventions   General Interventions Discussed/Reviewed General Interventions Discussed, General Interventions Reviewed, Doctor Visits  Doctor Visits Discussed/Reviewed Doctor Visits Discussed, Doctor Visits Reviewed  Pharmacy Interventions   Pharmacy Dicussed/Reviewed Pharmacy Topics Discussed, Pharmacy Topics Reviewed  [[facility]]      TOC Interventions Today    Flowsheet Row Most Recent Value  TOC Interventions   TOC Interventions Discussed/Reviewed TOC Interventions Discussed, TOC Interventions Reviewed       Gean Maidens BSN RN [facility] [phone]

## 2023-05-10 NOTE — Progress Notes (Unsigned)
HPI: [name] is a 77 y.o. female with PMHx significant for CHF,severe COPD,insomnia,anxiety,depression, CAD,and PAD  here today with her husband to follow on recent hospital visit. Hospitalized from 05/01/2023 to 05/06/2023. TOC call 05/08/23.  According to records she presented to the ER via EMS the day of admission complaining of worsening shortness of breath. She clarifies, states that she was taken to the ER because abdominal pain, nausea,vomiting, and diarrhea as well as right upper chest pain. She thinks GI symptoms are caused by mesenteric ischemia.She has not seen vascular since 2021.  CXR showed heterogeneous reticular opacities in the right upper lobe superimposed on background emphysema is new/worsened since the prior radiograph from 06/29/2022. She was diagnosed with community-acquired pneumonia and COPD exacerbation. Treated with Rocephin for 5 days. Discharged on oral prednisone to complete 3 days.  HH services were not deep necessary.  She expresses dissatisfaction with her recent hospital care and states that she did not feel like she was ready to be discharged. She does not feel like she is at her baseline in regard to respiratory symptoms. Still having productive cough and chest congestion. Upset because she did not receive her Flonase nasal spray during hospitalization. COPD with frequent exacerbation and chronic respiratory failure with hypoxia on O2 supplementation, usually at night, she has been using 4 LPM during the day as needed and continues with 2 LMP at night. O2 sats at home 96-97% She is on Albuterol inh,Pulmicort nebs bid, and Trelegy Ellipta 200-62.5-25 mcg 1 puff daily. She is not doing incentive spirometer.  Follows with Dr Maple Hudson. Requesting prescription for Chantix, started smoking again, 8-9 cig/day. She has tolerated chantix well in the past with no side effects.  Lab Results  Component Value Date   ALT 51 (H) 05/02/2023   AST 46 (H)  05/02/2023   ALKPHOS 108 05/02/2023   BILITOT 0.6 05/02/2023   Abdomen/pelvic CT done on 05/06/23: 1. Small fat containing midline supraumbilical hernia with stranding of the herniated fat. Correlation with clinical exam and point tenderness recommended to evaluate for possibility of strangulation or incarceration. No bowel herniation. 2. Sigmoid diverticulosis. No bowel obstruction. 3. Cholelithiasis. 4.  Aortic Atherosclerosis (ICD10-I70.0).  She reports episodes of upper abdominal pain,N/V,and diarrhea after eating. She started symptoms about 14 days ago but has had them intermittently for 4 years or so. She has not noted melena or blood in stool. Last episode of vomiting yesterday after eating dinner. Trying to increase protein intake. GERD: She is on Omeprazole 20 mg, which she states was decreased to every day, so heartburn started back. She increased dose to bid after hospital discharge, which helps with symptoms. EGD in 2016.  CAD,PAD, and chronic mesenteric ischemia: She is on Plavix 75 mg daily, Aspirin 81 mg daily,Pletal 100 mg bid,Atorvastatin 80 mg daily,and Zetia 10 mg daily. Lab Results  Component Value Date   CHOL 145 12/26/2021   HDL 93 12/26/2021   LDLCALC 40 12/26/2021   LDLDIRECT 88.0 08/18/2013   TRIG 57 12/26/2021   CHOLHDL 1.6 12/26/2021   Review of Systems  Constitutional:  Positive for fatigue. Negative for chills and fever.  HENT:  Negative for sore throat and trouble swallowing.   Respiratory:  Positive for cough, shortness of breath and wheezing.   Cardiovascular:  Negative for chest pain, palpitations and leg swelling.  Gastrointestinal:  Positive for abdominal pain, nausea and vomiting.  Endocrine: Negative for cold intolerance and heat intolerance.  Genitourinary:  Negative for decreased urine volume, dysuria  and hematuria.  Musculoskeletal:  Positive for arthralgias and back pain.  Neurological:  Negative for syncope and headaches.   Psychiatric/Behavioral:  Negative for confusion and hallucinations. The patient is nervous/anxious.   See other pertinent positives and negatives in HPI.  Current Outpatient Medications on File Prior to Visit  Medication Sig Dispense Refill   albuterol (VENTOLIN HFA) 108 (90 Base) MCG/ACT inhaler TAKE 2 PUFFS BY MOUTH EVERY 6 HOURS AS NEEDED FOR WHEEZE OR SHORTNESS OF BREATH (Patient taking differently: Inhale 2 puffs into the lungs every 6 (six) hours as needed for shortness of breath.) 8.5 each 3   aspirin EC 81 MG tablet Take 1 tablet (81 mg total) by mouth daily. 30 tablet    atorvastatin (LIPITOR) 80 MG tablet Take 1 tablet (80 mg total) by mouth daily. 90 tablet 3   budesonide (PULMICORT) 0.5 MG/2ML nebulizer solution Take 2 mLs (0.5 mg total) by nebulization 2 (two) times daily. 20 mL 12   cilostazol (PLETAL) 100 MG tablet Take 1 tablet (100 mg total) by mouth 2 (two) times daily. 180 tablet 3   clopidogrel (PLAVIX) 75 MG tablet Take 1 tablet (75 mg total) by mouth daily. 90 tablet 3   DUPIXENT 300 MG/2ML SOPN Inject 300 mg into the skin every 14 (fourteen) days.     ezetimibe (ZETIA) 10 MG tablet Take 1 tablet (10 mg total) by mouth daily. 90 tablet 0   ferrous sulfate (FEROSUL) 325 (65 FE) MG tablet TAKE ONE TABLET BY MOUTH DAILY AT 9AM WITH BREAKFAST (Patient taking differently: Take 325 mg by mouth daily with breakfast.) 90 tablet 2   fluticasone (FLONASE) 50 MCG/ACT nasal spray SPRAY 1 SPRAY IN EACH NOSTRIL TWICE DAILY (BULK) (Patient taking differently: Place 1 spray into both nostrils 2 (two) times daily. SPRAY 1 SPRAY IN EACH NOSTRIL TWICE DAILY (BULK)) 48 g 2   Fluticasone-Umeclidin-Vilant (TRELEGY ELLIPTA) 200-62.5-25 MCG/ACT AEPB TAKE 1 PUFF BY MOUTH EVERY DAY (Patient taking differently: Inhale 1 puff into the lungs daily.) 60 each 12   furosemide (LASIX) 40 MG tablet TAKE 1 TABLET BY MOUTH TWICE  DAILY 200 tablet 2   hydrOXYzine (ATARAX) 25 MG tablet Take 25 mg by mouth at  bedtime.     ipratropium-albuterol (DUONEB) 0.5-2.5 (3) MG/3ML SOLN Inhale 3 mLs into the lungs as directed. TID for 1 week and then use as needed Q6 hours. 360 mL 4   KLOR-CON M20 20 MEQ tablet TAKE 1 TABLET BY MOUTH EVERY DAY 90 tablet 1   levothyroxine (SYNTHROID) 112 MCG tablet TAKE ONE-HALF TABLET BY MOUTH ON TUESDAY AND THURSDAY . TAKE 1  TABLET BY MOUTH ON MONDAY,  WEDNESDAY, AND FRIDAY , SATURDAY , AND SUNDAY (Patient taking differently: Take 112 mcg by mouth See admin instructions. TAKE ONE-HALF TABLET BY MOUTH ON TUESDAY AND THURSDAY . TAKE 1  TABLET BY MOUTH ON MONDAY,  WEDNESDAY, AND FRIDAY , SATURDAY , AND SUNDAY) 86 tablet 2   LORazepam (ATIVAN) 2 MG tablet TAKE ONE TABLET BY MOUTH DAILY AT 9PM AT BEDTIME (Patient taking differently: Take 2 mg by mouth at bedtime.) 30 tablet 3   montelukast (SINGULAIR) 10 MG tablet TAKE 1 TABLET BY MOUTH EVERYDAY AT BEDTIME (Patient taking differently: Take 10 mg by mouth at bedtime.) 90 tablet 1   nystatin (MYCOSTATIN) 100000 UNIT/ML suspension Take 5 mLs (500,000 Units total) by mouth 4 (four) times daily for 10 days. 200 mL 0   pantoprazole (PROTONIX) 20 MG tablet Take 1 tablet (20 mg total)  by mouth 2 (two) times daily before a meal. 100 tablet 2   PARoxetine (PAXIL) 20 MG tablet TAKE ONE TABLET BY MOUTH DAILY AT 9AM (Patient taking differently: Take 20 mg by mouth daily.) 90 tablet 2   tamsulosin (FLOMAX) 0.4 MG CAPS capsule TAKE ONE CAPSULE (0.4 MG TOTAL) BY MOUTH DAILY AT 9AM (Patient taking differently: Take 0.4 mg by mouth daily after breakfast.) 90 capsule 2   traZODone (DESYREL) 100 MG tablet TAKE ONE TABLET BY MOUTH DAILY AT 9PM AT BEDTIME (Patient taking differently: Take 100 mg by mouth at bedtime.) 90 tablet 2   No current facility-administered medications on file prior to visit.    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)    05/ 2018  chronic non-occlusive DVT RLE   Chronic kidney disease    COPD, frequent exacerbations (HCC)    03-06-2018 per pt last exacerbation 12/ 2018   Coronary artery disease    cardiologist-  dr Shirlee Latch-- 03-11-2018 er cath , 80% stenosis in the ostial second diagonal   Dyspnea on minimal exertion    Emphysema/COPD (HCC)    CAT score- 17   Family history of breast cancer    Family history of colon cancer    Family history of melanoma    Family history of ovarian cancer    Family history of pancreatic cancer    Fibromyalgia 1995   GERD (gastroesophageal reflux disease)    Hiatal hernia    History of breast cancer    History of diverticulitis of colon    2002  s/p  sigmoid colectomy   History of multiple pulmonary nodules    hx RUL nodules x2  s/p right VATS w/ wedge resection 09-11-2005 and 06-14-2011  both  necrotizing granulomatous inflammation w/ cystic area of necrosis & focal calcification   History of right breast cancer oncologist-  dr [name]-- no recurrence   dx 2010--  IDC, Stage IA , ER/PR+,  (pT1c pN0) 11-09-2008 right lumpectomy;  12-18-2008 right simple mastectomy for DCIS margins;  completed chemotherapy 2010 (no radiation) and completed antiestrogen therapy   Hx of colonic polyps    Dr. 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   Nocturia    OA (osteoarthritis)    thumbs   On supplemental oxygen therapy    03-06-2018 per pt uses only at night,  checks O2 sats at home,  stated am sat 89% after moving around average 93-94% with RA   Osteoporosis    Peripheral arterial occlusive disease (HCC) vascular-- dr chen/ dr Randie Heinz   proximal right SFA severe focal stenosis with collaterals from the PFA/  04/ 2012 occluded celiac and SMA arteries with distal reconstitution w/ patent IMA   Peripheral vascular  disease (HCC)    chronic DVT RLE,  mesenteric vascular disease   Pneumonia    Right lower lobe pulmonary nodule    Chest CT 08-29-2017   Sleep apnea    Stage 3 severe COPD by GOLD classification (HCC) hx frequent exacerbations--   pulmologist-  dr Inocente Salles--  per lov note , dated 12-20-2017, oxyogen 2L is prescribed for use with exertion (but pt only uses  mostly at night), O2 sats on RA run the 80s , this day sat 86% RA and with 2L O2 sat 92%   Varicose veins of leg with swelling    varicose vein surgery - Dr. Guss Bunde   Wears glasses    Wears hearing aid in both ears    Allergies  Allergen Reactions   Sinequan [Doxepin] Other (See Comments)    Kept her awake    Sulfa Antibiotics Swelling   Sulfur     Other reaction(s): Unknown    Social History   Socioeconomic History   Marital status: Married    Spouse name: Not on file   Number of children: 3   Years of education: 12   Highest education level: Not on file  Occupational History   Occupation: Magazine features editor: RETIRED    Comment: retired  Tobacco Use   Smoking status: Former    Current packs/day: 0.00    Average packs/day: 0.3 packs/day for 50.0 years (12.5 ttl pk-yrs)    Types: Cigarettes    Start date: 05/18/1966    Quit date: 05/18/2016    Years since quitting: 6.9   Smokeless tobacco: Never  Vaping Use   Vaping status: Never Used  Substance and Sexual Activity   Alcohol use: Yes    Alcohol/week: 14.0 standard drinks of alcohol    Types: 14 Cans of beer per week    Comment: every night   Drug use: No   Sexual activity: Not on file  Other Topics Concern   Not on file  Social History Narrative   HSG, beauty school. Married '69 -7 yrs/widowed; married '79 - 48yr/divorced; married '87.  2 sons - '70, '74, 1 srtep-son; 1 grandchild. 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 SO - good health.  NO history of abuse.  ACP - full code and all heroic measures.    Social Determinants of Health   Financial Resource  Strain: Low Risk  (02/25/2023)   Overall Financial Resource Strain (CARDIA)    Difficulty of Paying Living Expenses: Not hard at all  Food Insecurity: No Food Insecurity (05/08/2023)   Hunger Vital Sign    Worried About Running Out of Food in the Last Year: Never true    Ran Out of Food in the Last Year: Never true  Transportation Needs: No Transportation Needs (05/08/2023)   PRAPARE - Administrator, Civil Service (Medical): No    Lack of Transportation (Non-Medical): No  Physical Activity: Inactive (02/25/2023)   Exercise Vital Sign    Days of Exercise per Week: 0 days    Minutes of Exercise per Session: 0 min  Stress: No Stress Concern Present (02/25/2023)   [employer] of Occupational Health - Occupational Stress Questionnaire    Feeling of Stress : Not at all  Social Connections: Moderately Isolated (02/25/2023)   Social Connection and Isolation Panel [NHANES]    Frequency of Communication with Friends and Family: More than three times a week    Frequency of Social Gatherings with Friends and Family: More than three times a week    Attends Religious Services: Never    Database administrator or Organizations: No    Attends Banker Meetings: Never    Marital Status: Married    Vitals:   05/13/23 1421  BP: 122/70  Pulse: 82  SpO2: 95%   Body mass index is 19.35 kg/m.  Physical Exam Vitals and nursing note reviewed.  Constitutional:  General: She is not in acute distress.    Appearance: She is well-developed.  HENT:     Head: Normocephalic and atraumatic.     Mouth/Throat:     Mouth: Mucous membranes are moist.     Pharynx: Oropharynx is clear.  Eyes:     Conjunctiva/sclera: Conjunctivae normal.  Cardiovascular:     Rate and Rhythm: Normal rate and regular rhythm.     Heart sounds: No murmur heard.    Comments: DP pulses palpable. Pulmonary:     Effort: Pulmonary effort is normal. No respiratory distress.     Breath sounds: Wheezing  present. No rhonchi or rales.     Comments: Wheezing with forced expiration, bilateral,diffuse. Abdominal:     Palpations: Abdomen is soft. There is no hepatomegaly or mass.     Tenderness: There is no abdominal tenderness.  Lymphadenopathy:     Cervical: No cervical adenopathy.  Skin:    General: Skin is warm.     Findings: No erythema or rash.  Neurological:     General: No focal deficit present.     Mental Status: She is alert and oriented to person, place, and time.     Cranial Nerves: No cranial nerve deficit.     Comments: Mildly unstable gait, not assisted.  Psychiatric:        Mood and Affect: Affect normal. Mood is anxious.   ASSESSMENT AND PLAN:  Ms. Nasteha was seen today for hospitalization follow-up.  Diagnoses and all orders for this visit:  Calculus of gallbladder without cholecystitis without obstruction Assessment & Plan: Seen on abdominal/pelvic CT. GI symptoms she is reporting could also be related to this problem. She ahs also had elevated alk phos and transaminases. RUQ [place] ordered and surgical consultation will be arranged.  Orders: -     US ABDOMEN LIMITED RUQ (LIVER/GB); Future -     Ambulatory referral to General Surgery  Community acquired pneumonia, unspecified laterality Assessment & Plan: Symptoms have improved, still feeling chest congestion. Upon lung auscultation I do not appreciate rales or rhonchi today. Completed abx treatment. Explained that cough and congestion could last a few more days and even weeks. Recommend incentive spirometry 2-3 times per day, she has device at home. CXR in 2-3 weeks. Instructed about warning signs.  Orders: -     Basic metabolic panel; Future -     CBC; Future -     Hepatic function panel; Future -     DG Chest 2 View; Future  Chronic mesenteric ischemia (HCC) Assessment & Plan: Postprandial upper abdominal pain could certainly be caused by this problem. She has not seen vascular since 2021 and then it  was recommended 6 months follow up. New referral placed. Encouraged smoking cessation. Continue Atorvastatin 80 mg daily,Zetia 10 mg daily, Pletal 100 mg bid,Plavix 75 mg daily,and Aspirin 81 mg daily. Instructed about warning signs.  Orders: -     Ambulatory referral to Vascular Surgery  Supraumbilical hernia without gangrene and without obstruction Assessment & Plan: Small with fat content. High surgical risk for complications. She was referred to surgeon mainly to evaluate cholelithiasis but this problem can also be assessed. Instructed about warning signs.   Tobacco use disorder Assessment & Plan: Started back smoking. She understands adverse effects of tobacco use and interested in taking Chantix again. Denies any side effects when she took medication in the past. Rx sent with instructions.   COPD, frequent exacerbations (HCC) Assessment & Plan: She feel like she is not  back to baseline. Today on auscultation mild wheezing with force expiration. On Albuterol inh,Pulmicort nebs bid, and Trelegy Ellipta 200-62.5-25 mcg 1 puff daily. Continue O2 supplementation at 2 LPM instead 4 LPM as needed to maintain a O2 sat 88 or above. Instructed to arrange appt with her pulmonologist.   Tobacco abuse Assessment & Plan: Started back smoking. She understands adverse effects of tobacco use and interested in taking Chantix again. Denies any side effects when she took medication in the past. Rx sent with instructions.  Orders: -     Varenicline Tartrate; Daily for 3 days then bid for 4 days then continue with 1 mg.  Dispense: 11 tablet; Refill: 0 -     Varenicline Tartrate; Take 1 tablet (1 mg total) by mouth 2 (two) times daily.  Dispense: 60 tablet; Refill: 1  Gastroesophageal reflux disease with esophagitis without hemorrhage Assessment & Plan: Reports improvement of symptoms when she increase Omeprazole dose back to 20 mg bid. GERD precautions also recommended. If symptoms  become recurrent GI evaluation is to be considered.   I spent a total of 60 minutes in both face to face and non face to face activities for this visit on the date of this encounter. During this time history was obtained and documented, examination was performed, prior labs/imaging reviewed, and assessment/plan discussed.  Return if symptoms worsen or fail to improve, for keep next appointment.  Donisha Hoch G. [place], MD  [facility]. [place] [facility].

## 2023-05-13 ENCOUNTER — Ambulatory Visit (INDEPENDENT_AMBULATORY_CARE_PROVIDER_SITE_OTHER): Payer: 59 | Admitting: Family Medicine

## 2023-05-13 ENCOUNTER — Encounter: Payer: Self-pay | Admitting: Family Medicine

## 2023-05-13 ENCOUNTER — Other Ambulatory Visit: Payer: Self-pay | Admitting: Vascular Surgery

## 2023-05-13 ENCOUNTER — Other Ambulatory Visit: Payer: Self-pay | Admitting: Family Medicine

## 2023-05-13 VITALS — BP 122/70 | HR 82 | Ht 65.0 in | Wt 116.2 lb

## 2023-05-13 DIAGNOSIS — F17200 Nicotine dependence, unspecified, uncomplicated: Secondary | ICD-10-CM

## 2023-05-13 DIAGNOSIS — K551 Chronic vascular disorders of intestine: Secondary | ICD-10-CM | POA: Diagnosis not present

## 2023-05-13 DIAGNOSIS — K439 Ventral hernia without obstruction or gangrene: Secondary | ICD-10-CM | POA: Diagnosis not present

## 2023-05-13 DIAGNOSIS — Z720 Tobacco use: Secondary | ICD-10-CM

## 2023-05-13 DIAGNOSIS — J189 Pneumonia, unspecified organism: Secondary | ICD-10-CM

## 2023-05-13 DIAGNOSIS — G4700 Insomnia, unspecified: Secondary | ICD-10-CM

## 2023-05-13 DIAGNOSIS — J441 Chronic obstructive pulmonary disease with (acute) exacerbation: Secondary | ICD-10-CM

## 2023-05-13 DIAGNOSIS — K2100 Gastro-esophageal reflux disease with esophagitis, without bleeding: Secondary | ICD-10-CM

## 2023-05-13 DIAGNOSIS — K8020 Calculus of gallbladder without cholecystitis without obstruction: Secondary | ICD-10-CM | POA: Diagnosis not present

## 2023-05-13 DIAGNOSIS — F419 Anxiety disorder, unspecified: Secondary | ICD-10-CM

## 2023-05-13 MED ORDER — VARENICLINE TARTRATE 0.5 MG PO TABS
ORAL_TABLET | ORAL | 0 refills | Status: AC
Start: 2023-05-13 — End: 2023-05-20

## 2023-05-13 MED ORDER — VARENICLINE TARTRATE 1 MG PO TABS
1.0000 mg | ORAL_TABLET | Freq: Two times a day (BID) | ORAL | 1 refills | Status: AC
Start: 2023-05-13 — End: 2023-07-12

## 2023-05-13 NOTE — Assessment & Plan Note (Addendum)
She feel like she is not back to baseline. Today on auscultation mild wheezing with force expiration. On Albuterol inh,Pulmicort nebs bid, and Trelegy Ellipta 200-62.5-25 mcg 1 puff daily. Continue O2 supplementation at 2 LPM instead 4 LPM as needed to maintain a O2 sat 88 or above. Instructed to arrange appt with her pulmonologist.

## 2023-05-13 NOTE — Assessment & Plan Note (Addendum)
Seen on abdominal/pelvic CT. GI symptoms she is reporting could also be related to this problem. She ahs also had elevated alk phos and transaminases. RUQ [place] ordered and surgical consultation will be arranged.

## 2023-05-13 NOTE — Assessment & Plan Note (Signed)
Started back smoking. She understands adverse effects of tobacco use and interested in taking Chantix again. Denies any side effects when she took medication in the past. Rx sent with instructions.

## 2023-05-13 NOTE — Patient Instructions (Addendum)
A few things to remember from today's visit:  Elevated transaminase level - Plan: US Abdomen Limited RUQ (LIVER/GB)  Community acquired pneumonia, unspecified laterality - Plan: Basic metabolic panel, CBC, Hepatic function panel, Hepatic function panel, CBC, Basic metabolic panel  Chronic mesenteric ischemia (HCC) - Plan: Ambulatory referral to Vascular Surgery  Calculus of gallbladder without cholecystitis without obstruction - Plan: Ambulatory referral to General Surgery  Supraumbilical hernia without gangrene and without obstruction  Tobacco use disorder - Plan: varenicline (CHANTIX) 0.5 MG tablet, varenicline (CHANTIX) 1 MG tablet  Chantix 1 tab daily x 3 days then 2 times daily for 4 days then continue 1 mg 2 times daily. No changes today. Please call pulmonologist and cardiologist's office to arrange follow up appts. Keep next appt.  If you need refills for medications you take chronically, please call your pharmacy. Do not use My Chart to request refills or for acute issues that need immediate attention. If you send a my chart message, it may take a few days to be addressed, specially if I am not in the office.  Please be sure medication list is accurate. If a new problem present, please set up appointment sooner than planned today.

## 2023-05-13 NOTE — Assessment & Plan Note (Addendum)
Symptoms have improved, still feeling chest congestion. Upon lung auscultation I do not appreciate rales or rhonchi today. Completed abx treatment. Explained that cough and congestion could last a few more days and even weeks. Recommend incentive spirometry 2-3 times per day, she has device at home. CXR in 2-3 weeks. Instructed about warning signs.

## 2023-05-13 NOTE — Assessment & Plan Note (Signed)
Small with fat content. High surgical risk for complications. She was referred to surgeon mainly to evaluate cholelithiasis but this problem can also be assessed. Instructed about warning signs.

## 2023-05-13 NOTE — Assessment & Plan Note (Signed)
Postprandial upper abdominal pain could certainly be caused by this problem. She has not seen vascular since 2021 and then it was recommended 6 months follow up. New referral placed. Encouraged smoking cessation. Continue Atorvastatin 80 mg daily,Zetia 10 mg daily, Pletal 100 mg bid,Plavix 75 mg daily,and Aspirin 81 mg daily. Instructed about warning signs.

## 2023-05-13 NOTE — Assessment & Plan Note (Signed)
Reports improvement of symptoms when she increase Omeprazole dose back to 20 mg bid. GERD precautions also recommended. If symptoms become recurrent GI evaluation is to be considered.

## 2023-05-15 ENCOUNTER — Ambulatory Visit (HOSPITAL_BASED_OUTPATIENT_CLINIC_OR_DEPARTMENT_OTHER)
Admission: RE | Admit: 2023-05-15 | Discharge: 2023-05-15 | Disposition: A | Discharge status: Home / Self Care | Payer: 59 | Source: Ambulatory Visit | Attending: Family Medicine | Admitting: Family Medicine

## 2023-05-15 DIAGNOSIS — K838 Other specified diseases of biliary tract: Secondary | ICD-10-CM | POA: Diagnosis not present

## 2023-05-15 DIAGNOSIS — R197 Diarrhea, unspecified: Secondary | ICD-10-CM | POA: Diagnosis not present

## 2023-05-15 DIAGNOSIS — K7689 Other specified diseases of liver: Secondary | ICD-10-CM | POA: Diagnosis not present

## 2023-05-15 DIAGNOSIS — K8020 Calculus of gallbladder without cholecystitis without obstruction: Secondary | ICD-10-CM | POA: Diagnosis not present

## 2023-05-20 ENCOUNTER — Other Ambulatory Visit (HOSPITAL_COMMUNITY): Payer: Self-pay

## 2023-05-22 DIAGNOSIS — J9601 Acute respiratory failure with hypoxia: Secondary | ICD-10-CM | POA: Diagnosis not present

## 2023-05-22 DIAGNOSIS — R0602 Shortness of breath: Secondary | ICD-10-CM | POA: Diagnosis not present

## 2023-05-22 DIAGNOSIS — J449 Chronic obstructive pulmonary disease, unspecified: Secondary | ICD-10-CM | POA: Diagnosis not present

## 2023-05-23 ENCOUNTER — Other Ambulatory Visit: Payer: Self-pay | Admitting: Family Medicine

## 2023-05-23 DIAGNOSIS — J449 Chronic obstructive pulmonary disease, unspecified: Secondary | ICD-10-CM | POA: Diagnosis not present

## 2023-05-28 ENCOUNTER — Encounter: Payer: Self-pay | Admitting: Family Medicine

## 2023-05-28 ENCOUNTER — Other Ambulatory Visit: Payer: Self-pay | Admitting: Family Medicine

## 2023-05-28 DIAGNOSIS — K219 Gastro-esophageal reflux disease without esophagitis: Secondary | ICD-10-CM

## 2023-05-28 NOTE — Telephone Encounter (Signed)
Error/njr °

## 2023-05-31 ENCOUNTER — Ambulatory Visit: Payer: 59

## 2023-06-03 ENCOUNTER — Other Ambulatory Visit: Payer: Self-pay | Admitting: Family Medicine

## 2023-06-03 ENCOUNTER — Other Ambulatory Visit: Payer: 59

## 2023-06-03 ENCOUNTER — Other Ambulatory Visit: Payer: Self-pay | Admitting: Vascular Surgery

## 2023-06-03 ENCOUNTER — Other Ambulatory Visit: Payer: Self-pay

## 2023-06-03 ENCOUNTER — Telehealth: Payer: Self-pay | Admitting: Internal Medicine

## 2023-06-03 DIAGNOSIS — F419 Anxiety disorder, unspecified: Secondary | ICD-10-CM

## 2023-06-03 DIAGNOSIS — G4700 Insomnia, unspecified: Secondary | ICD-10-CM

## 2023-06-03 MED ORDER — ALBUTEROL SULFATE HFA 108 (90 BASE) MCG/ACT IN AERS: 2.0000 | INHALATION_SPRAY | Freq: Four times a day (QID) | RESPIRATORY_TRACT | 6 refills | Status: DC | PRN

## 2023-06-03 NOTE — Telephone Encounter (Signed)
Albuterol has been sent to patients pharmacy. She is aware. Closing encounter. NFN

## 2023-06-03 NOTE — Telephone Encounter (Signed)
Albuterol has been sent to patients pharmacy. She is aware. NFN

## 2023-06-03 NOTE — Telephone Encounter (Signed)
Refill for   albuterol (VENTOLIN HFA) 108 (90 Base) MCG/ACT inhaler

## 2023-06-11 ENCOUNTER — Other Ambulatory Visit: Payer: Self-pay | Admitting: Internal Medicine

## 2023-06-11 ENCOUNTER — Other Ambulatory Visit: Payer: Self-pay | Admitting: Family Medicine

## 2023-06-18 ENCOUNTER — Telehealth: Payer: Self-pay | Admitting: Family Medicine

## 2023-06-18 NOTE — Telephone Encounter (Signed)
Prescription Request  06/18/2023  LOV: 05/13/2023  What is the name of the medication or equipment? tamsulosin (FLOMAX) 0.4 MG CAPS capsule   Have you contacted your pharmacy to request a refill? No   Which pharmacy would you like this sent to?   [facility] [place] [address] [place] [address] Phone: [phone] Fax: [phone]     Patient notified that their request is being sent to the clinical staff for review and that they should receive a response within 2 business days.   Please advise at

## 2023-06-19 MED ORDER — TAMSULOSIN HCL 0.4 MG PO CAPS: ORAL_CAPSULE | ORAL | 2 refills | Status: DC

## 2023-06-19 NOTE — Addendum Note (Signed)
Addended by: Weyman Croon E on: 06/19/2023 10:55 AM   Modules accepted: Orders

## 2023-06-22 DIAGNOSIS — J9601 Acute respiratory failure with hypoxia: Secondary | ICD-10-CM | POA: Diagnosis not present

## 2023-06-22 DIAGNOSIS — J449 Chronic obstructive pulmonary disease, unspecified: Secondary | ICD-10-CM | POA: Diagnosis not present

## 2023-06-22 DIAGNOSIS — R0602 Shortness of breath: Secondary | ICD-10-CM | POA: Diagnosis not present

## 2023-06-23 DIAGNOSIS — J449 Chronic obstructive pulmonary disease, unspecified: Secondary | ICD-10-CM | POA: Diagnosis not present

## 2023-06-25 ENCOUNTER — Other Ambulatory Visit: Payer: Self-pay | Admitting: *Deleted

## 2023-06-25 DIAGNOSIS — K551 Chronic vascular disorders of intestine: Secondary | ICD-10-CM

## 2023-06-26 ENCOUNTER — Encounter: Payer: Self-pay | Admitting: Family Medicine

## 2023-06-26 NOTE — Progress Notes (Unsigned)
HPI: [name] is a 77 y.o. female, who is here today for chronic disease management.  Last seen on 05/13/23. No new problems since her last visit.  Abnormal LFT's and cholelithiasis, she has an appt with surgeon tomorrow. She was referred also because persistent nausea,vomiting,and diarrhea after eating intermittently for about 4 years. Asymptomatic at this time.  Lab Results  Component Value Date   TSH 0.538 05/02/2023   Lab Results  Component Value Date   ALT 21 05/13/2023   AST 16 05/13/2023   ALKPHOS 95 05/13/2023   BILITOT 0.6 05/13/2023   B12 deficiency: Discontinued 06/2022 04/2023, because B12 was elevated > 1500. 10/2021 it was low at 191.  Insomnia, anxiety,and depression: She is on  Lorazepam 2 mg daily at bedtime, Paroxetine 20 mg daily,and Trazodone 100 mg at bedtime. Reports that she is in bed for about 10 hours, difficulty falling asleep and wakes up a couple times to urinate. Problem stable.  Follows with urologist annually, she is on Flomax 0.4 mg daily.  She reports no side effects from her current medications.   COPD: She has not had wheezing, no worsening of cough or wheezing.  She has upcoming appointments with a pulmonologist, surgeon, and vascular.  Still has poor appetite but making an effort to eat, focusing on protein intake. 10 lb weight gain since last visit.  She mentions that  back and hip pain have improved some, she follows with ortho. Feeling less unsteady than before.  Tobacco use: Quit smoking since hospital discharge, 04/2023. She started Chantix, no side effects.  Review of Systems  Constitutional:  Positive for fatigue. Negative for chills and fever.  HENT:  Negative for nosebleeds and sore throat.   Cardiovascular:  Negative for chest pain, palpitations and leg swelling.  Gastrointestinal:  Negative for abdominal pain, nausea and vomiting.  Genitourinary:  Negative for decreased urine volume, dysuria and hematuria.   Neurological:  Negative for syncope and headaches.  Psychiatric/Behavioral:  Negative for confusion and hallucinations.   See other pertinent positives and negatives in HPI.  Current Outpatient Medications on File Prior to Visit  Medication Sig Dispense Refill   albuterol (VENTOLIN HFA) 108 (90 Base) MCG/ACT inhaler Inhale 2 puffs into the lungs every 6 (six) hours as needed for wheezing or shortness of breath. TAKE 2 PUFFS BY MOUTH EVERY 6 HOURS AS NEEDED FOR WHEEZE OR SHORTNESS OF BREATH Strength: 108 (90 Base) MCG/ACT 8.5 each 6   aspirin EC 81 MG tablet Take 1 tablet (81 mg total) by mouth daily. 30 tablet    atorvastatin (LIPITOR) 80 MG tablet Take 1 tablet (80 mg total) by mouth daily. 90 tablet 3   budesonide (PULMICORT) 0.5 MG/2ML nebulizer solution Take 2 mLs (0.5 mg total) by nebulization 2 (two) times daily. 20 mL 12   cilostazol (PLETAL) 100 MG tablet Take 1 tablet (100 mg total) by mouth 2 (two) times daily. 180 tablet 3   clopidogrel (PLAVIX) 75 MG tablet Take 1 tablet (75 mg total) by mouth daily. 90 tablet 3   DUPIXENT 300 MG/2ML SOPN Inject 300 mg into the skin every 14 (fourteen) days.     ezetimibe (ZETIA) 10 MG tablet Take 1 tablet (10 mg total) by mouth daily. 90 tablet 0   ferrous sulfate (FEROSUL) 325 (65 FE) MG tablet TAKE ONE TABLET BY MOUTH DAILY AT 9AM WITH BREAKFAST (Patient taking differently: Take 325 mg by mouth daily with breakfast.) 90 tablet 2   fluticasone (FLONASE) 50 MCG/ACT  nasal spray SPRAY 1 SPRAY IN EACH NOSTRIL TWICE DAILY (BULK) (Patient taking differently: Place 1 spray into both nostrils 2 (two) times daily. SPRAY 1 SPRAY IN EACH NOSTRIL TWICE DAILY (BULK)) 48 g 2   furosemide (LASIX) 40 MG tablet TAKE ONE TABLET BY MOUTH TWICE DAILY @ 9AM-5PM 180 tablet 2   hydrOXYzine (ATARAX) 25 MG tablet Take 25 mg by mouth at bedtime.     ipratropium-albuterol (DUONEB) 0.5-2.5 (3) MG/3ML SOLN Inhale 3 mLs into the lungs as directed. TID for 1 week and then use as  needed Q6 hours. 360 mL 4   KLOR-CON M20 20 MEQ tablet TAKE 1 TABLET BY MOUTH EVERY DAY 90 tablet 1   levothyroxine (SYNTHROID) 112 MCG tablet TAKE ONE-HALF TABLET BY MOUTH ON TUESDAY AND THURSDAY . TAKE 1  TABLET BY MOUTH ON MONDAY,  WEDNESDAY, AND FRIDAY , SATURDAY , AND SUNDAY (Patient taking differently: Take 112 mcg by mouth See admin instructions. TAKE ONE-HALF TABLET BY MOUTH ON TUESDAY AND THURSDAY . TAKE 1  TABLET BY MOUTH ON MONDAY,  WEDNESDAY, AND FRIDAY , SATURDAY , AND SUNDAY) 86 tablet 2   LORazepam (ATIVAN) 2 MG tablet Take 1 tablet (2 mg total) by mouth at bedtime. 30 tablet 3   montelukast (SINGULAIR) 10 MG tablet TAKE 1 TABLET BY MOUTH EVERYDAY AT BEDTIME (Patient taking differently: Take 10 mg by mouth at bedtime.) 90 tablet 1   pantoprazole (PROTONIX) 20 MG tablet TAKE 1 TABLET BY MOUTH AT  BEDTIME 100 tablet 2   PARoxetine (PAXIL) 20 MG tablet TAKE ONE TABLET BY MOUTH DAILY AT 9AM (Patient taking differently: Take 20 mg by mouth daily.) 90 tablet 2   tamsulosin (FLOMAX) 0.4 MG CAPS capsule TAKE ONE CAPSULE (0.4 MG TOTAL) BY MOUTH DAILY 90 capsule 2   traZODone (DESYREL) 100 MG tablet TAKE ONE TABLET BY MOUTH DAILY AT 9PM AT BEDTIME (Patient taking differently: Take 100 mg by mouth at bedtime.) 90 tablet 2   TRELEGY ELLIPTA 200-62.5-25 MCG/ACT AEPB INHALE 1 PUFF BY MOUTH ONCE DAILY (RINSE MOUTH AFTER EACH USE) (BULK) 60 each 11   varenicline (CHANTIX) 1 MG tablet Take 1 tablet (1 mg total) by mouth 2 (two) times daily. 60 tablet 1   No current facility-administered medications on file prior to visit.    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)    05/ 2018  chronic non-occlusive DVT RLE   Chronic kidney disease    COPD, frequent exacerbations (HCC)    03-06-2018 per pt last exacerbation 12/ 2018   Coronary  artery disease    cardiologist-  dr Shirlee Latch-- 03-11-2018 er cath , 80% stenosis in the ostial second diagonal   Dyspnea on minimal exertion    Emphysema/COPD (HCC)    CAT score- 17   Family history of breast cancer    Family history of colon cancer    Family history of melanoma    Family history of ovarian cancer    Family history of pancreatic cancer    Fibromyalgia 1995   GERD (gastroesophageal reflux disease)    Hiatal hernia    History of breast cancer    History of diverticulitis of colon    2002  s/p  sigmoid colectomy   History of multiple pulmonary nodules    hx  RUL nodules x2  s/p right VATS w/ wedge resection 09-11-2005 and 06-14-2011  both  necrotizing granulomatous inflammation w/ cystic area of necrosis & focal calcification   History of right breast cancer oncologist-  dr [name]-- no recurrence   dx 2010--  IDC, Stage IA , ER/PR+,  (pT1c pN0) 11-09-2008 right lumpectomy;  12-18-2008 right simple mastectomy for DCIS margins;  completed chemotherapy 2010 (no radiation) and completed antiestrogen therapy   Hx of colonic polyps    Dr. 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   Nocturia    OA (osteoarthritis)    thumbs   On supplemental oxygen therapy    03-06-2018 per pt uses only at night,  checks O2 sats at home,  stated am sat 89% after moving around average 93-94% with RA   Osteoporosis    Peripheral arterial occlusive disease (HCC) vascular-- dr chen/ dr Randie Heinz   proximal right SFA severe focal stenosis with collaterals from the PFA/  04/ 2012 occluded celiac and SMA arteries with distal reconstitution w/ patent IMA   Peripheral vascular disease (HCC)    chronic DVT RLE,  mesenteric vascular disease   Pneumonia    Right lower lobe pulmonary nodule    Chest CT 08-29-2017   Sleep apnea    Stage 3 severe COPD by GOLD classification (HCC) hx frequent  exacerbations--   pulmologist-  dr Inocente Salles--  per lov note , dated 12-20-2017, oxyogen 2L is prescribed for use with exertion (but pt only uses mostly at night), O2 sats on RA run the 80s , this day sat 86% RA and with 2L O2 sat 92%   Varicose veins of leg with swelling    varicose vein surgery - Dr. Guss Bunde   Wears glasses    Wears hearing aid in both ears    Allergies  Allergen Reactions   Sinequan [Doxepin] Other (See Comments)    Kept her awake    Sulfa Antibiotics Swelling   Sulfur     Other reaction(s): Unknown    Social History   Socioeconomic History   Marital status: Married    Spouse name: Not on file   Number of children: 3   Years of education: 12   Highest education level: Not on file  Occupational History   Occupation: Magazine features editor: RETIRED    Comment: retired  Tobacco Use   Smoking status: Former    Current packs/day: 0.00    Average packs/day: 0.3 packs/day for 50.0 years (12.5 ttl pk-yrs)    Types: Cigarettes    Start date: 05/18/1966    Quit date: 05/18/2016    Years since quitting: 7.1   Smokeless tobacco: Never  Vaping Use   Vaping status: Never Used  Substance and Sexual Activity   Alcohol use: Yes    Alcohol/week: 14.0 standard drinks of alcohol    Types: 14 Cans of beer per week    Comment: every night   Drug use: No   Sexual activity: Not on file  Other Topics Concern   Not on file  Social History Narrative   ** Merged History Encounter **       HSG, beauty school. Married '69 -7 yrs/widowed; married '79 - 51yr/divorced; married '87.  2 sons - '70, '74, 1 srtep-son; 1 grandchild. 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 SO - good health.  NO  history of abuse.  ACP - full code and all heroic measures.    Social Determinants of Health   Financial Resource Strain: Low Risk  (02/25/2023)   Overall Financial Resource Strain (CARDIA)    Difficulty of Paying Living Expenses: Not hard at all  Food Insecurity: No Food Insecurity  (05/08/2023)   Hunger Vital Sign    Worried About Running Out of Food in the Last Year: Never true    Ran Out of Food in the Last Year: Never true  Transportation Needs: No Transportation Needs (05/08/2023)   PRAPARE - Administrator, Civil Service (Medical): No    Lack of Transportation (Non-Medical): No  Physical Activity: Inactive (02/25/2023)   Exercise Vital Sign    Days of Exercise per Week: 0 days    Minutes of Exercise per Session: 0 min  Stress: No Stress Concern Present (02/25/2023)   [employer] of Occupational Health - Occupational Stress Questionnaire    Feeling of Stress : Not at all  Social Connections: Moderately Isolated (02/25/2023)   Social Connection and Isolation Panel [NHANES]    Frequency of Communication with Friends and Family: More than three times a week    Frequency of Social Gatherings with Friends and Family: More than three times a week    Attends Religious Services: Never    Database administrator or Organizations: No    Attends Banker Meetings: Never    Marital Status: Married   Today's Vitals   06/28/23 1404  BP: 124/70  Pulse: 74  Resp: 16  Temp: 97.7 F (36.5 C)  TempSrc: Oral  SpO2: 95%  Weight: 126 lb 6 oz (57.3 kg)  Height: 5\' 5"  (1.651 m)   Body mass index is 21.03 kg/m.  Wt Readings from Last 3 Encounters:  06/28/23 126 lb 6 oz (57.3 kg)  05/13/23 116 lb 4 oz (52.7 kg)  05/01/23 117 lb (53.1 kg)   Physical Exam Vitals and nursing note reviewed.  Constitutional:      General: She is not in acute distress.    Appearance: She is well-developed and well-groomed.  HENT:     Head: Normocephalic and atraumatic.     Mouth/Throat:     Mouth: Mucous membranes are moist.  Eyes:     Conjunctiva/sclera: Conjunctivae normal.  Cardiovascular:     Rate and Rhythm: Normal rate and regular rhythm.     Heart sounds: No murmur heard.    Comments: DP pulses palpable. Pulmonary:     Effort: Pulmonary effort is  normal. No respiratory distress.     Breath sounds: No wheezing, rhonchi or rales.  Abdominal:     Palpations: Abdomen is soft. There is no mass.     Tenderness: There is no abdominal tenderness.  Musculoskeletal:     Right lower leg: No edema.     Left lower leg: No edema.  Skin:    General: Skin is warm.     Findings: No erythema or rash.  Neurological:     General: No focal deficit present.     Mental Status: She is alert and oriented to person, place, and time.     Gait: Gait normal.     Comments: Mildly unstable gait, not assisted.  Psychiatric:        Mood and Affect: Mood and affect normal.   ASSESSMENT AND PLAN:  [name] was seen today for medical management of chronic issues.  Diagnoses and all orders for this visit:  Lab Results  Component Value Date   VITAMINB12 189 (L) 06/28/2023   Insomnia, unspecified type Assessment & Plan: Problem is stable. She has tried other medications in the past and current regimen has helped the most. Continue Trazodone 100 mg daily and Lorazepam 2 mg at bedtime. Good sleep hygiene is also recommended.    Anxiety disorder, unspecified type Assessment & Plan: Stable. Continue Paroxetine 20 mg daily and Lorazepam 2 mg daily. We discussed risk of possible medication interaction with Trazodone and Paroxetine. F/U in 5-6 months, before if needed.   B12 deficiency Assessment & Plan: Recommend continuing weekly B12 1000-2000 mcg. Will check B12 today and recommendations will be given accordingly.  Orders: -     Vitamin B12; Future  Need for influenza vaccination -     Flu Vaccine Trivalent High Dose (Fluad)  Tobacco use disorder Assessment & Plan: She has not resumed tobacco use, encouraged to continue doing so. Continue Chantix to complete 11 weeks.   Calculus of gallbladder without cholecystitis without obstruction Assessment & Plan: Having intermittent nausea,vomiting,and diarrhea. She has had abnormal LFT's. She has  consultation with surgeon tomorrow.   Malignant neoplasm of urinary bladder, unspecified site [facility]) Assessment & Plan: Follows with urologist annually for cystoscopy, last one in 01/2023. Encouraged smoking cessation.   Urine frequency Assessment & Plan: She takes daily Flomax 0.4 mg, which has been prescribed by her urologist. Also hx of nephrolithiasis. Continue same management.   Return in about 25 weeks (around 12/20/2023) for chronic problems.  Charmagne Buhl G. [place], MD  [facility]. [place] office.

## 2023-06-28 ENCOUNTER — Ambulatory Visit (INDEPENDENT_AMBULATORY_CARE_PROVIDER_SITE_OTHER): Payer: 59 | Admitting: Family Medicine

## 2023-06-28 VITALS — BP 124/70 | HR 74 | Temp 97.7°F | Resp 16 | Ht 65.0 in | Wt 126.4 lb

## 2023-06-28 DIAGNOSIS — F17200 Nicotine dependence, unspecified, uncomplicated: Secondary | ICD-10-CM

## 2023-06-28 DIAGNOSIS — Z23 Encounter for immunization: Secondary | ICD-10-CM

## 2023-06-28 DIAGNOSIS — K8020 Calculus of gallbladder without cholecystitis without obstruction: Secondary | ICD-10-CM

## 2023-06-28 DIAGNOSIS — R350 Frequency of micturition: Secondary | ICD-10-CM | POA: Diagnosis not present

## 2023-06-28 DIAGNOSIS — C679 Malignant neoplasm of bladder, unspecified: Secondary | ICD-10-CM

## 2023-06-28 DIAGNOSIS — F419 Anxiety disorder, unspecified: Secondary | ICD-10-CM

## 2023-06-28 DIAGNOSIS — G4700 Insomnia, unspecified: Secondary | ICD-10-CM | POA: Diagnosis not present

## 2023-06-28 DIAGNOSIS — E538 Deficiency of other specified B group vitamins: Secondary | ICD-10-CM | POA: Insufficient documentation

## 2023-06-28 LAB — VITAMIN B12: Vitamin B-12: 189 pg/mL — ABNORMAL LOW (ref 211–911)

## 2023-06-28 NOTE — Patient Instructions (Addendum)
A few things to remember from today's visit:  Insomnia, unspecified type  Anxiety disorder, unspecified type  B12 deficiency - Plan: Vitamin B12 Start B12 once per week, 1000-2000 mcg. Will re-evaluate depending of lab. No other changes today,  If you need refills for medications you take chronically, please call your pharmacy. Do not use My Chart to request refills or for acute issues that need immediate attention. If you send a my chart message, it may take a few days to be addressed, specially if I am not in the office.  Please be sure medication list is accurate. If a new problem present, please set up appointment sooner than planned today.

## 2023-06-30 ENCOUNTER — Encounter: Payer: Self-pay | Admitting: Family Medicine

## 2023-06-30 NOTE — Assessment & Plan Note (Signed)
She takes daily Flomax 0.4 mg, which has been prescribed by her urologist. Also hx of nephrolithiasis. Continue same management.

## 2023-06-30 NOTE — Assessment & Plan Note (Signed)
Recommend continuing weekly B12 1000-2000 mcg. Will check B12 today and recommendations will be given accordingly.

## 2023-06-30 NOTE — Assessment & Plan Note (Signed)
She has not resumed tobacco use, encouraged to continue doing so. Continue Chantix to complete 11 weeks.

## 2023-06-30 NOTE — Assessment & Plan Note (Signed)
Follows with urologist annually for cystoscopy, last one in 01/2023. Encouraged smoking cessation.

## 2023-06-30 NOTE — Assessment & Plan Note (Signed)
Having intermittent nausea,vomiting,and diarrhea. She has had abnormal LFT's. She has consultation with surgeon tomorrow.

## 2023-06-30 NOTE — Assessment & Plan Note (Signed)
Stable. Continue Paroxetine 20 mg daily and Lorazepam 2 mg daily. We discussed risk of possible medication interaction with Trazodone and Paroxetine. F/U in 5-6 months, before if needed.

## 2023-06-30 NOTE — Assessment & Plan Note (Signed)
Problem is stable. She has tried other medications in the past and current regimen has helped the most. Continue Trazodone 100 mg daily and Lorazepam 2 mg at bedtime. Good sleep hygiene is also recommended.

## 2023-07-02 ENCOUNTER — Ambulatory Visit (INDEPENDENT_AMBULATORY_CARE_PROVIDER_SITE_OTHER): Payer: 59

## 2023-07-02 ENCOUNTER — Other Ambulatory Visit: Payer: Self-pay | Admitting: Family Medicine

## 2023-07-02 DIAGNOSIS — K8010 Calculus of gallbladder with chronic cholecystitis without obstruction: Secondary | ICD-10-CM | POA: Diagnosis not present

## 2023-07-02 DIAGNOSIS — E538 Deficiency of other specified B group vitamins: Secondary | ICD-10-CM | POA: Diagnosis not present

## 2023-07-02 DIAGNOSIS — R197 Diarrhea, unspecified: Secondary | ICD-10-CM | POA: Diagnosis not present

## 2023-07-02 DIAGNOSIS — E876 Hypokalemia: Secondary | ICD-10-CM

## 2023-07-02 DIAGNOSIS — R1032 Left lower quadrant pain: Secondary | ICD-10-CM | POA: Diagnosis not present

## 2023-07-02 MED ORDER — CYANOCOBALAMIN 1000 MCG/ML IJ SOLN
1000.0000 ug | Freq: Once | INTRAMUSCULAR | Status: AC
Start: 2023-07-02 — End: 2023-07-02
  Administered 2023-07-02: 1000 ug via INTRAMUSCULAR

## 2023-07-02 NOTE — Progress Notes (Signed)
Pt here for monthly B12 injection per Dr. [place].  B12 given IM and pt tolerated injection well.

## 2023-07-03 ENCOUNTER — Ambulatory Visit (INDEPENDENT_AMBULATORY_CARE_PROVIDER_SITE_OTHER): Payer: 59 | Admitting: Vascular Surgery

## 2023-07-03 ENCOUNTER — Ambulatory Visit (HOSPITAL_COMMUNITY)
Admission: RE | Admit: 2023-07-03 | Discharge: 2023-07-03 | Disposition: A | Discharge status: Home / Self Care | Payer: 59 | Source: Ambulatory Visit | Attending: Vascular Surgery | Admitting: Vascular Surgery

## 2023-07-03 ENCOUNTER — Encounter: Payer: Self-pay | Admitting: Vascular Surgery

## 2023-07-03 VITALS — BP 138/77 | HR 63 | Temp 98.0°F | Ht 65.0 in | Wt 123.0 lb

## 2023-07-03 DIAGNOSIS — K551 Chronic vascular disorders of intestine: Secondary | ICD-10-CM

## 2023-07-03 NOTE — Progress Notes (Signed)
Patient ID: [name], female   DOB: 1946/06/27, 77 y.o.   MRN: [id number]  Reason for Consult: No chief complaint on file.   Referred by [place], [name], MD  Subjective:     HPI:  [name] is a 77 y.o. female with history of left subclavian artery stent for steal.  She also has known mesenteric artery occlusive disease.  She states that her usual weight is in the 140 range recently she was down to 114 but with dedicated eating she has gained back to 123.  She does have pain with eating but also significant indigestion which prevents her from eating.  She has been regular bowel movements no blood noticed.  She does not have food fear.  She has a previous history of sigmoid colectomy for diverticulitis.  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)    05/ 2018  chronic non-occlusive DVT RLE   Chronic kidney disease    COPD, frequent exacerbations (HCC)    03-06-2018 per pt last exacerbation 12/ 2018   Coronary artery disease    cardiologist-  dr Shirlee Latch-- 03-11-2018 er cath , 80% stenosis in the ostial second diagonal   Dyspnea on minimal exertion    Emphysema/COPD (HCC)    CAT score- 17   Family history of breast cancer    Family history of colon cancer    Family history of melanoma    Family history of ovarian cancer    Family history of pancreatic cancer    Fibromyalgia 1995   GERD (gastroesophageal reflux disease)    Hiatal hernia    History of breast cancer    History of diverticulitis of colon    2002  s/p  sigmoid colectomy   History of multiple pulmonary nodules    hx RUL nodules x2  s/p right VATS w/ wedge resection 09-11-2005 and 06-14-2011  both  necrotizing granulomatous inflammation w/ cystic area of necrosis & focal calcification   History of right breast cancer oncologist-  dr [name]-- no  recurrence   dx 2010--  IDC, Stage IA , ER/PR+,  (pT1c pN0) 11-09-2008 right lumpectomy;  12-18-2008 right simple mastectomy for DCIS margins;  completed chemotherapy 2010 (no radiation) and completed antiestrogen therapy   Hx of colonic polyps    Dr. 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   Nocturia    OA (osteoarthritis)    thumbs   On supplemental oxygen therapy    03-06-2018 per pt uses only at night,  checks O2 sats at home,  stated am sat 89% after moving around average 93-94% with RA   Osteoporosis    Peripheral arterial occlusive disease (HCC) vascular-- dr chen/ dr Randie Heinz   proximal right SFA severe focal stenosis with collaterals from the PFA/  04/ 2012 occluded celiac and SMA arteries with distal reconstitution w/ patent IMA   Peripheral vascular disease (HCC)    chronic DVT RLE,  mesenteric vascular disease   Pneumonia    Right lower lobe pulmonary nodule    Chest CT 08-29-2017   Sleep apnea    Stage 3 severe COPD by GOLD classification (HCC) hx frequent exacerbations--  pulmologist-  dr Inocente Salles--  per lov note , dated 12-20-2017, oxyogen 2L is prescribed for use with exertion (but pt only uses mostly at night), O2 sats on RA run the 80s , this day sat 86% RA and with 2L O2 sat 92%   Varicose veins of leg with swelling    varicose vein surgery - Dr. Guss Bunde   Wears glasses    Wears hearing aid in both ears    Family History  Problem Relation Age of Onset   Colon cancer Mother        colon   COPD Mother        brown lung   Hypertension Mother    Diabetes Mother    Emphysema Mother    Coronary artery disease Father    Heart attack Father    Sudden death Father    Heart disease Father    Cancer Sister        throat   Hypertension Sister    Coronary artery disease Brother    Heart attack Brother        early 60s   Throat cancer Sister    Ovarian  cancer Sister        fallopian tube cancer in her 44s   Melanoma Sister    Hypertension Sister    Pancreatic cancer Sister    Melanoma Niece        dx in her 30s   Breast cancer Niece 74   Past Surgical History:  Procedure Laterality Date   ABDOMINAL AORTOGRAM N/A 07/11/2020   Procedure: ABDOMINAL AORTOGRAM;  Surgeon: Maeola Harman, MD;  Location: [facility] INVASIVE CV LAB;  Service: Cardiovascular;  Laterality: N/A;   AORTIC ARCH ANGIOGRAPHY N/A 07/11/2020   Procedure: AORTIC ARCH ANGIOGRAPHY;  Surgeon: Maeola Harman, MD;  Location: [facility] INVASIVE CV LAB;  Service: Cardiovascular;  Laterality: N/A;   BREAST EXCISIONAL BIOPSY Left 10/15/2008   BREAST LUMPECTOMY W/ NEEDLE LOCALIZATION Right 11-09-2008    dr cornett   [facility]   CARDIAC CATHETERIZATION  03-12-2011   dr Shirlee Latch   70-80% ostial stenosis in small second diagonal (appears to small for intervention),  dLAD 40-50%,  minimal luminal irregulartieis involoving LCFx and RCA,  LVEF 55%   CATARACT EXTRACTION W/ INTRAOCULAR LENS  IMPLANT, BILATERAL  2011   CYSTOSCOPY N/A 03/11/2018   Procedure: CYSTOSCOPY WITH INSTILLATION OF POST OPERATIVE EPIRUBICIN;  Surgeon: Jerilee Field, MD;  Location: [facility] ORS;  Service: Urology;  Laterality: N/A;   LEFT HEART CATH AND CORONARY ANGIOGRAPHY N/A 05/25/2020   Procedure: LEFT HEART CATH AND CORONARY ANGIOGRAPHY;  Surgeon: Lennette Bihari, MD;  Location: [facility] INVASIVE CV LAB;  Service: Cardiovascular;  Laterality: N/A;   MASTECTOMY Right    PARTIAL COLECTOMY  2002   sigmoid and appectomy (diverticulitis)   PARTIAL MASTECTOMY WITH NEEDLE LOCALIZATION Left 02/23/2013   Procedure:  LEFT PARTIAL MASTECTOMY WITH NEEDLE LOCALIZATION;  Surgeon: [name], MD;  Location: [facility];  Service: General;  Laterality: Left;   PERIPHERAL VASCULAR INTERVENTION  07/11/2020   Procedure: PERIPHERAL VASCULAR INTERVENTION;  Surgeon: Maeola Harman, MD;  Location: [facility] INVASIVE CV LAB;   Service: Cardiovascular;;   RECONSTRUCTION BREAST W/ LATISSIMUS DORSI FLAP Right 05-30-2009   dr Odis Luster  [facility]   REDUCTION MAMMAPLASTY Left    SIMPLE MASTECTOMY Right 12-28-2008    dr Luisa Hart  [facility]   TRANSFORAMINAL LUMBAR INTERBODY FUSION W/ MIS 1 LEVEL Left 10/22/2022   Procedure: LUMBAR THREE-FOUR MINIMALLY INVASIVE TRANSFORAMINAL  LUMBAR INTERBODY FUSION;  Surgeon: Jadene Pierini, MD;  Location: [facility] OR;  Service: Neurosurgery;  Laterality: Left;  3C/RM 21 to follow   TRANSTHORACIC ECHOCARDIOGRAM  07-09-2016  dr Shirlee Latch   ef 55-60%, grade 1 diastoic dysfunction/  mild TR   TRANSURETHRAL RESECTION OF BLADDER TUMOR N/A 03/11/2018   Procedure: TRANSURETHRAL RESECTION OF BLADDER TUMOR (TURBT) 2-5cm;  Surgeon: Jerilee Field, MD;  Location: [facility];  Service: Urology;  Laterality: N/A;   UPPER EXTREMITY ANGIOGRAPHY Left 07/11/2020   Procedure: Upper Extremity Angiography;  Surgeon: Maeola Harman, MD;  Location: [facility] INVASIVE CV LAB;  Service: Cardiovascular;  Laterality: Left;   VAGINAL HYSTERECTOMY  1973   VIDEO ASSISTED THORACOSCOPY (VATS)/WEDGE RESECTION Right 09-11-2005  &  06-14-2011   dr Sloan Leiter  [facility]   both RUL    Short Social History:  Social History   Tobacco Use   Smoking status: Former    Current packs/day: 0.00    Average packs/day: 0.3 packs/day for 50.0 years (12.5 ttl pk-yrs)    Types: Cigarettes    Start date: 05/18/1966    Quit date: 05/18/2016    Years since quitting: 7.1   Smokeless tobacco: Never  Substance Use Topics   Alcohol use: Yes    Alcohol/week: 14.0 standard drinks of alcohol    Types: 14 Cans of beer per week    Comment: every night    Allergies  Allergen Reactions   Sinequan [Doxepin] Other (See Comments)    Kept her awake    Sulfa Antibiotics Swelling   Sulfur     Other reaction(s): Unknown    Current Outpatient Medications  Medication Sig Dispense Refill   albuterol (VENTOLIN HFA) 108 (90 Base) MCG/ACT inhaler Inhale 2 puffs into  the lungs every 6 (six) hours as needed for wheezing or shortness of breath. TAKE 2 PUFFS BY MOUTH EVERY 6 HOURS AS NEEDED FOR WHEEZE OR SHORTNESS OF BREATH Strength: 108 (90 Base) MCG/ACT 8.5 each 6   aspirin EC 81 MG tablet Take 1 tablet (81 mg total) by mouth daily. 30 tablet    atorvastatin (LIPITOR) 80 MG tablet Take 1 tablet (80 mg total) by mouth daily. 90 tablet 3   budesonide (PULMICORT) 0.5 MG/2ML nebulizer solution Take 2 mLs (0.5 mg total) by nebulization 2 (two) times daily. 20 mL 12   cilostazol (PLETAL) 100 MG tablet Take 1 tablet (100 mg total) by mouth 2 (two) times daily. 180 tablet 3   clopidogrel (PLAVIX) 75 MG tablet Take 1 tablet (75 mg total) by mouth daily. 90 tablet 3   DUPIXENT 300 MG/2ML SOPN Inject 300 mg into the skin every 14 (fourteen) days.     ezetimibe (ZETIA) 10 MG tablet Take 1 tablet (10 mg total) by mouth daily. 90 tablet 0   ferrous sulfate (FEROSUL) 325 (65 FE) MG tablet TAKE ONE TABLET BY MOUTH DAILY AT 9AM WITH BREAKFAST (Patient taking differently: Take 325 mg by mouth daily with breakfast.) 90 tablet 2   fluticasone (FLONASE) 50 MCG/ACT nasal spray SPRAY 1 SPRAY IN EACH NOSTRIL TWICE DAILY (BULK) (Patient taking differently: Place 1 spray into both nostrils 2 (two) times daily. SPRAY 1 SPRAY IN EACH NOSTRIL TWICE DAILY (BULK)) 48 g 2   furosemide (LASIX) 40 MG tablet TAKE ONE TABLET BY MOUTH TWICE DAILY @ 9AM-5PM 180 tablet 2   hydrOXYzine (ATARAX) 25 MG tablet Take 25 mg by mouth at bedtime.     ipratropium-albuterol (DUONEB) 0.5-2.5 (3) MG/3ML SOLN Inhale 3 mLs into  the lungs as directed. TID for 1 week and then use as needed Q6 hours. 360 mL 4   levothyroxine (SYNTHROID) 112 MCG tablet TAKE ONE-HALF TABLET BY MOUTH ON TUESDAY AND THURSDAY . TAKE 1  TABLET BY MOUTH ON MONDAY,  WEDNESDAY, AND FRIDAY , SATURDAY , AND SUNDAY (Patient taking differently: Take 112 mcg by mouth See admin instructions. TAKE ONE-HALF TABLET BY MOUTH ON TUESDAY AND THURSDAY . TAKE 1   TABLET BY MOUTH ON MONDAY,  WEDNESDAY, AND FRIDAY , SATURDAY , AND SUNDAY) 86 tablet 2   LORazepam (ATIVAN) 2 MG tablet Take 1 tablet (2 mg total) by mouth at bedtime. 30 tablet 3   montelukast (SINGULAIR) 10 MG tablet TAKE 1 TABLET BY MOUTH EVERYDAY AT BEDTIME (Patient taking differently: Take 10 mg by mouth at bedtime.) 90 tablet 1   pantoprazole (PROTONIX) 20 MG tablet TAKE 1 TABLET BY MOUTH AT  BEDTIME 100 tablet 2   PARoxetine (PAXIL) 20 MG tablet TAKE ONE TABLET BY MOUTH DAILY AT 9AM (Patient taking differently: Take 20 mg by mouth daily.) 90 tablet 2   potassium chloride SA (KLOR-CON M) 20 MEQ tablet TAKE ONE TABLET BY MOUTH THREE TIMES DAILY @ 9AM-1PM-5PM 270 tablet 2   tamsulosin (FLOMAX) 0.4 MG CAPS capsule TAKE ONE CAPSULE (0.4 MG TOTAL) BY MOUTH DAILY 90 capsule 2   traZODone (DESYREL) 100 MG tablet TAKE ONE TABLET BY MOUTH DAILY AT 9PM AT BEDTIME (Patient taking differently: Take 100 mg by mouth at bedtime.) 90 tablet 2   TRELEGY ELLIPTA 200-62.5-25 MCG/ACT AEPB INHALE 1 PUFF BY MOUTH ONCE DAILY (RINSE MOUTH AFTER EACH USE) (BULK) 60 each 11   varenicline (CHANTIX) 1 MG tablet Take 1 tablet (1 mg total) by mouth 2 (two) times daily. 60 tablet 1   No current facility-administered medications for this visit.    Review of Systems  Constitutional: Positive for unexpected weight change.  HENT: HENT negative.  Eyes: Eyes negative.  Respiratory: Respiratory negative.  Cardiovascular: Cardiovascular negative.  GI: Positive for abdominal pain and nausea.  Musculoskeletal: Musculoskeletal negative.  Skin: Skin negative.  Neurological: Neurological negative. Hematologic: Hematologic/lymphatic negative.  Psychiatric: Positive for depressed mood.        Objective:  Objective   Vitals:   07/03/23 0930  BP: 138/77  Pulse: 63  Temp: 98 F (36.7 C)  SpO2: 95%     Physical Exam HENT:     Head: Normocephalic.     Nose: Nose normal.  Eyes:     Pupils: Pupils are equal, round,  and reactive to light.  Neck:     Vascular: No carotid bruit.  Cardiovascular:     Rate and Rhythm: Normal rate.     Pulses:          Radial pulses are 0 on the left side.     Comments: Strong left subclavian and left brachial pulses Pulmonary:     Effort: Pulmonary effort is normal.  Abdominal:     Palpations: Abdomen is soft. There is no mass.  Skin:    General: Skin is warm.     Capillary Refill: Capillary refill takes less than 2 seconds.  Neurological:     General: No focal deficit present.     Mental Status: She is alert.  Psychiatric:        Mood and Affect: Mood normal.        Thought Content: Thought content normal.        Judgment: Judgment normal.  Data: Duplex Findings:  +--------------------+--------+--------+--------+--------------------------  ----+  Mesenteric         PSV cm/sEDV cm/s Plaque            Comments              +--------------------+--------+--------+--------+--------------------------  ----+  Aorta Prox             45      12                                            +--------------------+--------+--------+--------+--------------------------  ----+  Aorta Mid              75      13                                            +--------------------+--------+--------+--------+--------------------------  ----+  Aorta Distal           55                                                    +--------------------+--------+--------+--------+--------------------------  ----+  Celiac Artery Origin                         Not visualized-  chronically                                                           occluded              +--------------------+--------+--------+--------+--------------------------  ----+  SMA Origin                                   Not visualized-  chronically                                                           occluded               +--------------------+--------+--------+--------+--------------------------  ----+  SMA Proximal                                 Not visualized-  chronically                                                           occluded              +--------------------+--------+--------+--------+--------------------------  ----+  IMA  480     100   calcific                                 +--------------------+--------+--------+--------+--------------------------  ----+           Summary:  Mesenteric:  The Inferior Mesenteric artery appears stenotic. Unable to visualize  celiac artery and SMA, likely due to known chronic occlusion.      Assessment/Plan:    77 year old female with known occluded celiac and SMA with very large IMA collateralization although IMA stenosis noted on today's ultrasound evaluation.  She has recently gained 9 pounds with dedicated eating.  I discussed with the patient that she could undergo angiography with possible stenting of the IMA if she really could not gain weight or was having severe abdominal pain or food fear otherwise she would require large abdominal operation with bypass to her celiac and mesenteric arteries.  I will have her follow-up in 6 months to evaluate her weight and also evaluate the stent in her left subclavian artery which is patent by physical exam today but has not been evaluated recently although she remains asymptomatic from a subclavian standpoint.  If she does have worsening abdominal pain or weight loss we would need to obtain CT angio to evaluate our options with possible endovascular stenting of her IMA versus bypass to her celiac and/or SMA.     Maeola Harman MD [facility]

## 2023-07-05 ENCOUNTER — Other Ambulatory Visit: Payer: Self-pay

## 2023-07-05 DIAGNOSIS — G458 Other transient cerebral ischemic attacks and related syndromes: Secondary | ICD-10-CM

## 2023-07-08 ENCOUNTER — Other Ambulatory Visit: Payer: Self-pay | Admitting: *Deleted

## 2023-07-08 DIAGNOSIS — I2510 Atherosclerotic heart disease of native coronary artery without angina pectoris: Secondary | ICD-10-CM

## 2023-07-08 DIAGNOSIS — I10 Essential (primary) hypertension: Secondary | ICD-10-CM

## 2023-07-08 DIAGNOSIS — E785 Hyperlipidemia, unspecified: Secondary | ICD-10-CM

## 2023-07-08 NOTE — Telephone Encounter (Signed)
Received a refill for Ezetimibe, but refill had been sent in 7/15 # 90.  Pt has enough until 07/30/23, but is overdue for an appt.

## 2023-07-10 ENCOUNTER — Other Ambulatory Visit: Payer: Self-pay

## 2023-07-10 DIAGNOSIS — I10 Essential (primary) hypertension: Secondary | ICD-10-CM

## 2023-07-10 DIAGNOSIS — E785 Hyperlipidemia, unspecified: Secondary | ICD-10-CM

## 2023-07-10 DIAGNOSIS — I2510 Atherosclerotic heart disease of native coronary artery without angina pectoris: Secondary | ICD-10-CM

## 2023-07-10 MED ORDER — EZETIMIBE 10 MG PO TABS
10.0000 mg | ORAL_TABLET | Freq: Every day | ORAL | 0 refills | Status: DC
Start: 2023-07-10 — End: 2023-08-12

## 2023-07-21 ENCOUNTER — Other Ambulatory Visit: Payer: Self-pay | Admitting: Physician Assistant

## 2023-07-21 DIAGNOSIS — E785 Hyperlipidemia, unspecified: Secondary | ICD-10-CM

## 2023-07-21 DIAGNOSIS — I10 Essential (primary) hypertension: Secondary | ICD-10-CM

## 2023-07-21 DIAGNOSIS — I2510 Atherosclerotic heart disease of native coronary artery without angina pectoris: Secondary | ICD-10-CM

## 2023-07-22 DIAGNOSIS — R0602 Shortness of breath: Secondary | ICD-10-CM | POA: Diagnosis not present

## 2023-07-22 DIAGNOSIS — J9601 Acute respiratory failure with hypoxia: Secondary | ICD-10-CM | POA: Diagnosis not present

## 2023-07-22 DIAGNOSIS — J449 Chronic obstructive pulmonary disease, unspecified: Secondary | ICD-10-CM | POA: Diagnosis not present

## 2023-07-23 DIAGNOSIS — J449 Chronic obstructive pulmonary disease, unspecified: Secondary | ICD-10-CM | POA: Diagnosis not present

## 2023-08-01 ENCOUNTER — Other Ambulatory Visit: Payer: Self-pay | Admitting: Family Medicine

## 2023-08-11 ENCOUNTER — Other Ambulatory Visit: Payer: Self-pay | Admitting: Physician Assistant

## 2023-08-11 DIAGNOSIS — I2510 Atherosclerotic heart disease of native coronary artery without angina pectoris: Secondary | ICD-10-CM

## 2023-08-11 DIAGNOSIS — I10 Essential (primary) hypertension: Secondary | ICD-10-CM

## 2023-08-11 DIAGNOSIS — E785 Hyperlipidemia, unspecified: Secondary | ICD-10-CM

## 2023-08-12 ENCOUNTER — Other Ambulatory Visit: Payer: Self-pay | Admitting: Vascular Surgery

## 2023-08-12 ENCOUNTER — Other Ambulatory Visit: Payer: Self-pay

## 2023-08-12 DIAGNOSIS — I739 Peripheral vascular disease, unspecified: Secondary | ICD-10-CM

## 2023-08-12 MED ORDER — CILOSTAZOL 100 MG PO TABS: 100.0000 mg | ORAL_TABLET | Freq: Two times a day (BID) | ORAL | 0 refills | Status: DC

## 2023-08-15 ENCOUNTER — Telehealth: Payer: Self-pay | Admitting: Family Medicine

## 2023-08-15 ENCOUNTER — Other Ambulatory Visit: Payer: Self-pay | Admitting: Physician Assistant

## 2023-08-15 DIAGNOSIS — I10 Essential (primary) hypertension: Secondary | ICD-10-CM

## 2023-08-15 DIAGNOSIS — I739 Peripheral vascular disease, unspecified: Secondary | ICD-10-CM

## 2023-08-15 DIAGNOSIS — I2510 Atherosclerotic heart disease of native coronary artery without angina pectoris: Secondary | ICD-10-CM

## 2023-08-15 DIAGNOSIS — E785 Hyperlipidemia, unspecified: Secondary | ICD-10-CM

## 2023-08-15 NOTE — Telephone Encounter (Signed)
[facility] - [place], [place] - [address] Phone: [phone]  Fax: [phone]     * Called to request the following refills:  ezetimibe (ZETIA) 10 MG tablet  tamsulosin (FLOMAX) 0.4 MG CAPS capsule

## 2023-08-16 MED ORDER — EZETIMIBE 10 MG PO TABS: 10.0000 mg | ORAL_TABLET | Freq: Every day | ORAL | 1 refills | Status: DC

## 2023-08-16 MED ORDER — TAMSULOSIN HCL 0.4 MG PO CAPS: ORAL_CAPSULE | ORAL | 2 refills | Status: DC

## 2023-08-16 NOTE — Telephone Encounter (Signed)
Rxs sent

## 2023-08-22 DIAGNOSIS — J9601 Acute respiratory failure with hypoxia: Secondary | ICD-10-CM | POA: Diagnosis not present

## 2023-08-22 DIAGNOSIS — R0602 Shortness of breath: Secondary | ICD-10-CM | POA: Diagnosis not present

## 2023-08-22 DIAGNOSIS — J449 Chronic obstructive pulmonary disease, unspecified: Secondary | ICD-10-CM | POA: Diagnosis not present

## 2023-08-23 ENCOUNTER — Other Ambulatory Visit: Payer: Self-pay | Admitting: Family Medicine

## 2023-08-23 DIAGNOSIS — F419 Anxiety disorder, unspecified: Secondary | ICD-10-CM

## 2023-08-23 DIAGNOSIS — J449 Chronic obstructive pulmonary disease, unspecified: Secondary | ICD-10-CM | POA: Diagnosis not present

## 2023-08-23 DIAGNOSIS — G4700 Insomnia, unspecified: Secondary | ICD-10-CM

## 2023-08-23 NOTE — Telephone Encounter (Signed)
Prescription Request  08/23/2023  LOV: 06/28/2023  What is the name of the medication or equipment? LORazepam (ATIVAN) 2 MG tablet   Have you contacted your pharmacy to request a refill? No   Which pharmacy would you like this sent to?   [facility] - [place], [place] - [address] [place] [address] Phone: [phone] Fax: [phone]     Patient notified that their request is being sent to the clinical staff for review and that they should receive a response within 2 business days.   Please advise at [facility]

## 2023-08-26 NOTE — Telephone Encounter (Signed)
It seems like she still has one refill available to be filled when she is due.Last refill 08/01/23. Thanks, BJ

## 2023-08-29 ENCOUNTER — Other Ambulatory Visit: Payer: Self-pay | Admitting: Family Medicine

## 2023-08-29 DIAGNOSIS — E876 Hypokalemia: Secondary | ICD-10-CM

## 2023-09-02 ENCOUNTER — Ambulatory Visit (INDEPENDENT_AMBULATORY_CARE_PROVIDER_SITE_OTHER): Payer: 59

## 2023-09-02 DIAGNOSIS — L821 Other seborrheic keratosis: Secondary | ICD-10-CM | POA: Diagnosis not present

## 2023-09-02 DIAGNOSIS — E538 Deficiency of other specified B group vitamins: Secondary | ICD-10-CM | POA: Diagnosis not present

## 2023-09-02 DIAGNOSIS — Z79899 Other long term (current) drug therapy: Secondary | ICD-10-CM | POA: Diagnosis not present

## 2023-09-02 DIAGNOSIS — L2084 Intrinsic (allergic) eczema: Secondary | ICD-10-CM | POA: Diagnosis not present

## 2023-09-02 DIAGNOSIS — L219 Seborrheic dermatitis, unspecified: Secondary | ICD-10-CM | POA: Diagnosis not present

## 2023-09-02 MED ORDER — CYANOCOBALAMIN 1000 MCG/ML IJ SOLN
1000.0000 ug | Freq: Once | INTRAMUSCULAR | Status: AC
  Administered 2023-09-02: 1000 ug via INTRAMUSCULAR

## 2023-09-02 NOTE — Progress Notes (Signed)
Pt here for monthly B12 injection per Dr. [place].   B12 given IM and pt tolerated injection well.  Next B12 injection scheduled for 11/01/23.

## 2023-09-03 ENCOUNTER — Other Ambulatory Visit: Payer: Self-pay | Admitting: Family Medicine

## 2023-09-03 DIAGNOSIS — F419 Anxiety disorder, unspecified: Secondary | ICD-10-CM

## 2023-09-03 DIAGNOSIS — G4700 Insomnia, unspecified: Secondary | ICD-10-CM

## 2023-09-13 NOTE — Progress Notes (Signed)
HPI F Smoker(50 pkyrs) followed for OSA, Insomnia, complicated by  sCHF, CAD, Chronic DVT R lower leg, Mesenteric Ischemia, HTN, PAD, COPD( GOLD III)/ Dr Marchelle Gearing,  Chronic Respiratory Failure with Hypoxia, GERD, Hemoptysis, Hypothyroid,  Hx R Breast Cancer,  NPSG  07/27/20- AHI 26.5/ hr, desaturation to 84%, requiring 1L O2 (Nocturnal Hypoxemia),   Walk Test 12/24/2022 room air-minimum saturation 95%, maximum heart rate 102 HST 12/25/22- AHI 12.1/hr, deat to 80%/ avg 88%, body weight 118 lbs O2 sat at rest on room air 09/16/23- 93% O2 sat POC qualifying 09/16/23- 1 lap- HR 94/min, desat to 88%. On 2L POC walking O2 sat 94%.  =====================================================================================  03/12/23- 77 yoF Smoker(50 ? pkyrs) followed for OSA, Insomnia, complicated by  sCHF, CAD, Chronic DVT R lower leg, Mesenteric Ischemia, HTN, PAD, COPD( GOLD III)/ Dr Marchelle Gearing,  Chronic Respiratory Failure with Hypoxia, GERD, Hemoptysis, Hypothyroid,  Hx R Breast Cancer,                                Patient is hard of hearing. Husband is here.  O2 4L [facility] 2L sleep  CPAP bought directly from [employer] 10 cwp/ [facility] -Trelegy 200, Neb Duoneb, Ventolin hfa, Neb Pulmicort, Dupixent, Lorazepam, Trazodone HST 12/25/22- AHI 12.1/hr, deat to 80%/ avg 88%, body weight 118 lbs Body weight today-119 lbs Pt is doing well Sleep study reviewed.  She qualifies for replacement of old CPAP machine, changing to auto 5-15 and blending and O2 2 L for sleep. Back pain has been contributing to sleep difficulties but she reports it is improved.  09/16/23- 77 yoF Smoker(12.5 pkyrs) followed for OSA, Insomnia, complicated by  sCHF, CAD, Chronic DVT R lower leg, Mesenteric Ischemia, HTN, PAD, COPD( GOLD III)/ Dr Marchelle Gearing,  Chronic Respiratory Failure with Hypoxia, GERD, Hemoptysis, Hypothyroid,  Hx R Breast Cancer,                                Patient is hard of hearing. Husband is here. -Trelegy 200, Neb Duoneb,  Ventolin hfa, Neb Pulmicort, Dupixent, Lorazepam, TrazodoneRD, Hemoptysis, Hypothyroid,  Hx R Breast Cancer,                           O2 4L [facility] 2L sleep  CPAP auto 5-15/ with O2/ [facility]- Mitch from [employer] says they tried multiple times but couldn't contact her to replace old CPAP.  Body weight today-129 lbs Hosp July- CAP, COPD exacerbation, tobacco counseling,  Husband reports home O2 concentrator is old and getting very loud. O2 sat at rest on room air today 93% O2 sat POC qualifying 09/16/23- 1 lap- HR 94/min, desat to 88%. On 2L POC walking O2 sat 94%. Needs to replace old O2 concentrator and CPAP Needs to re-enroll patient assistance for Dupixent which has significantly improved asthma control. Also needs refills on meds. Note Trelegy keept causing thrush.  ROS-see HPI   + = positive Constitutional:    +weight loss, night sweats, fevers, chills, fatigue, lassitude. HEENT:    headaches, difficulty swallowing, tooth/dental problems, sore throat,       Sneezing,+ itching, ear ache, nasal congestion, post nasal drip, snoring CV:    chest pain, orthopnea, PND, swelling in lower extremities, anasarca,  dizziness, palpitations Resp:   +shortness of breath with exertion or at rest.                +productive cough,   non-productive cough, +coughing up of blood.              change in color of mucus.  wheezing.   Skin:    +rash or lesions. GI:  + heartburn, +indigestion, abdominal pain, nausea, vomiting, diarrhea,                 change in bowel habits, loss of appetite GU: dysuria, change in color of urine, no urgency or frequency.   flank pain. MS:   joint pain, stiffness, decreased range of motion, back pain. Neuro-     nothing unusual Psych:  change in mood or affect.  +depression or anxiety.   memory loss.  OBJ- Physical Exam General- Alert, Oriented, Affect-appropriate, Distress- none acute, + thin/frail, + wheelchair Skin- rash-none, lesions- none,  excoriation- none Lymphadenopathy- none Head- atraumatic            Eyes- Gross vision intact, PERRLA, conjunctivae and secretions clear            Ears- Hearing, canals-normal            Nose- Clear, no-Septal dev, mucus, polyps, erosion, perforation             Throat- Mallampati II , mucosa clear , drainage- none, tonsils- atrophic Neck- flexible , trachea midline, no stridor , thyroid nl, carotid no bruit Chest - symmetrical excursion , unlabored           Heart/CV- RRR , no murmur , no gallop  , no rub, nl s1 s2                           - JVD- none , edema- none, stasis changes- none, varices- none           Lung- + decreased, no rhonchi, wheeze- none, cough+ dry, dullness-none, rub- none           Chest wall-  Abd-  Br/ Gen/ Rectal- Not done, not indicated Extrem- cyanosis- none, clubbing, none, atrophy- none, strength- nl Neuro- grossly intact to observation

## 2023-09-16 ENCOUNTER — Ambulatory Visit (INDEPENDENT_AMBULATORY_CARE_PROVIDER_SITE_OTHER): Payer: 59 | Admitting: Internal Medicine

## 2023-09-16 ENCOUNTER — Encounter: Payer: Self-pay | Admitting: Internal Medicine

## 2023-09-16 VITALS — BP 122/64 | HR 82 | Temp 97.6°F | Ht 65.0 in | Wt 129.8 lb

## 2023-09-16 DIAGNOSIS — J441 Chronic obstructive pulmonary disease with (acute) exacerbation: Secondary | ICD-10-CM | POA: Diagnosis not present

## 2023-09-16 DIAGNOSIS — J9611 Chronic respiratory failure with hypoxia: Secondary | ICD-10-CM | POA: Diagnosis not present

## 2023-09-16 DIAGNOSIS — G4733 Obstructive sleep apnea (adult) (pediatric): Secondary | ICD-10-CM | POA: Diagnosis not present

## 2023-09-16 MED ORDER — BUDESONIDE 0.5 MG/2ML IN SUSP: 0.5000 mg | Freq: Two times a day (BID) | RESPIRATORY_TRACT | 12 refills | Status: DC

## 2023-09-16 MED ORDER — IPRATROPIUM-ALBUTEROL 0.5-2.5 (3) MG/3ML IN SOLN: 3.0000 mL | RESPIRATORY_TRACT | 4 refills | Status: DC

## 2023-09-16 MED ORDER — ALBUTEROL SULFATE HFA 108 (90 BASE) MCG/ACT IN AERS: 2.0000 | INHALATION_SPRAY | Freq: Four times a day (QID) | RESPIRATORY_TRACT | 12 refills | Status: DC | PRN

## 2023-09-16 NOTE — Patient Instructions (Addendum)
Refills sent to [facility] for Duoneb nebulizer solution, Budesonide neb solution, and albuterol rescue inhaler.  Order- POC O2 qualifying walk test.   Dx Chronic respiratory failure with hypoxia  Order- [facility]- please replace old CPAP machine, auto 5-15, mask of choice, humidifier, supplies, AirView/ card  Order DME [facility]- replace old Home O2 concentrator and POC   for O2 sleep and exertion    2L sleep, 2L POC exertion

## 2023-09-21 DIAGNOSIS — J9601 Acute respiratory failure with hypoxia: Secondary | ICD-10-CM | POA: Diagnosis not present

## 2023-09-21 DIAGNOSIS — R0602 Shortness of breath: Secondary | ICD-10-CM | POA: Diagnosis not present

## 2023-09-21 DIAGNOSIS — J449 Chronic obstructive pulmonary disease, unspecified: Secondary | ICD-10-CM | POA: Diagnosis not present

## 2023-09-22 DIAGNOSIS — J449 Chronic obstructive pulmonary disease, unspecified: Secondary | ICD-10-CM | POA: Diagnosis not present

## 2023-09-23 ENCOUNTER — Telehealth: Payer: Self-pay | Admitting: Internal Medicine

## 2023-09-23 DIAGNOSIS — G4733 Obstructive sleep apnea (adult) (pediatric): Secondary | ICD-10-CM

## 2023-09-23 NOTE — Telephone Encounter (Signed)
Ronnie husband checking on order for CPAP machine and POC. [facility] fax number is [phone]. Ronnie phone number is [phone].

## 2023-09-24 ENCOUNTER — Telehealth: Payer: Self-pay | Admitting: Family Medicine

## 2023-09-24 MED ORDER — TAMSULOSIN HCL 0.4 MG PO CAPS: ORAL_CAPSULE | ORAL | 2 refills | Status: DC

## 2023-09-24 NOTE — Telephone Encounter (Signed)
Pharmacy called to request this refill, on behalf of the Pt.   tamsulosin (FLOMAX) 0.4 MG CAPS capsules  [facility] - [place], [place] - [address] Phone: [phone]  Fax: [phone]

## 2023-09-25 NOTE — Telephone Encounter (Signed)
I do not see where any orders were placed from last OV.  Once orders are placed, the PCCs will complete.

## 2023-09-25 NOTE — Telephone Encounter (Signed)
PT's husband calling back about order for CPAP and some 02 for traveling.Please call to advise status. TY.

## 2023-09-25 NOTE — Telephone Encounter (Signed)
Spoke to patient's spouse, Ronnie(DPR). He is requesting order for replacement CPAP, o2 concentrator and POC for sleep.  Christen Bame stated that patient only wears oxygen at night and PRN during the day. He was under the impression that patient could use POC during sleep while traveling. Christen Bame is aware that POC can not be used with sleep.   Dr. Maple Hudson, please clarify order. Thanks   [facility] Duoneb nebulizer solution, Budesonide neb solution, and albuterol rescue inhaler.   Order- POC O2 qualifying walk test.   Dx Chronic respiratory failure with hypoxia   Order- [facility]- please replace old CPAP machine, auto 5-15, mask of choice, humidifier, supplies, AirView/ card   Order DME [facility]- replace old Home O2 concentrator and POC   for O2 sleep and exertion    2L sleep, 2L POC exertion

## 2023-09-26 NOTE — Telephone Encounter (Signed)
Can patient sleep with POC?

## 2023-09-26 NOTE — Telephone Encounter (Signed)
She can sleep with a POC by itself, but it won't work through CPAP because the positive pressure would keep the POC valve from opening. If she must travel with one or the other, probably safer to travel with CPAP, and only add O2 when at home.

## 2023-09-26 NOTE — Telephone Encounter (Signed)
Lm x1 for patient.  

## 2023-09-26 NOTE — Telephone Encounter (Signed)
Pt returning missed call. 

## 2023-09-27 NOTE — Telephone Encounter (Signed)
Spoke to patient and relayed below message/recommendations. She voiced her understanding.  She would like order placed for cpap.  Order has been placed.  Nothing further needed at this time

## 2023-09-30 ENCOUNTER — Other Ambulatory Visit: Payer: Self-pay | Admitting: Family Medicine

## 2023-09-30 DIAGNOSIS — F419 Anxiety disorder, unspecified: Secondary | ICD-10-CM

## 2023-09-30 DIAGNOSIS — G4700 Insomnia, unspecified: Secondary | ICD-10-CM

## 2023-10-01 ENCOUNTER — Other Ambulatory Visit: Payer: Self-pay | Admitting: Family Medicine

## 2023-10-08 ENCOUNTER — Other Ambulatory Visit: Payer: Self-pay | Admitting: Family Medicine

## 2023-10-08 NOTE — Telephone Encounter (Signed)
Copied from CRM [id number]. Topic: Clinical - Medication Refill >> Oct 08, 2023 12:58 PM Denese Killings wrote: Most Recent Primary Care Visit:  Provider: Kathreen Devoid  Department: [facility]  Visit Type: NURSE VISIT  Date: 09/02/2023  Medication: FEROSUL 325 (65 Fe) MG tablet  Has the patient contacted their pharmacy? Yes (Agent: If no, request that the patient contact the pharmacy for the refill. If patient does not wish to contact the pharmacy document the reason why and proceed with request.) (Agent: If yes, when and what did the pharmacy advise?)  Is this the correct pharmacy for this prescription? Yes If no, delete pharmacy and type the correct one.  This is the patient's preferred pharmacy:  [facility] - [place] [address] [place] [address] Phone: [phone] Fax: [phone]  Has the prescription been filled recently?   Is the patient out of the medication?   Has the patient been seen for an appointment in the last year OR does the patient have an upcoming appointment?   Can we respond through MyChart?   Agent: Please be advised that Rx refills may take up to 3 business days. We ask that you follow-up with your pharmacy.

## 2023-10-14 ENCOUNTER — Telehealth: Payer: Self-pay | Admitting: Internal Medicine

## 2023-10-14 ENCOUNTER — Telehealth: Payer: Self-pay

## 2023-10-14 NOTE — Telephone Encounter (Signed)
Husband is calling for patient. They spoke with [facility] and [facility] states that they did not receive the order for the patient's CPAP Machine. Last encounter states that order was placed and received by [facility]. Please call and advise [phone]

## 2023-10-14 NOTE — Telephone Encounter (Signed)
Copied from CRM [id number]. Topic: Clinical - Medication Refill >> Oct 11, 2023 12:36 PM Ernst Spell wrote: Most Recent Primary Care Visit:  Provider: Weyman Croon E  Department: [facility]  Visit Type: NURSE VISIT  Date: 09/02/2023  Medication: varenicline (CHANTIX) 0.5 MG tablet furosemide (LASIX) 40 MG tablet cilostazol (PLETAL) 100 MG tablet montelukast (SINGULAIR) tablet 10 mg esomeprazole (NEXIUM) 40 MG capsule  Pharmacy called on behalf of the patient.    Is this the correct pharmacy for this prescription? Yes  This is the patient's preferred pharmacy:   [facility] - [place] [address] [place] [address] Phone: [phone] Fax: [phone]   Has the prescription been filled recently? No  Is the patient out of the medication? Pharmacy is unaware   Has the patient been seen for an appointment in the last year OR does the patient have an upcoming appointment? Yes  Can we respond through MyChart? No  Agent: Please be advised that Rx refills may take up to 3 business days. We ask that you follow-up with your pharmacy.

## 2023-10-21 MED ORDER — FUROSEMIDE 40 MG PO TABS: ORAL_TABLET | ORAL | 2 refills | Status: DC

## 2023-10-21 NOTE — Telephone Encounter (Signed)
 Rx prescribed by PCP has been refilled.

## 2023-10-21 NOTE — Addendum Note (Signed)
 Addended by: Weyman Croon E on: 10/21/2023 11:14 AM   Modules accepted: Orders

## 2023-10-22 DIAGNOSIS — J9601 Acute respiratory failure with hypoxia: Secondary | ICD-10-CM | POA: Diagnosis not present

## 2023-10-22 DIAGNOSIS — J449 Chronic obstructive pulmonary disease, unspecified: Secondary | ICD-10-CM | POA: Diagnosis not present

## 2023-10-22 DIAGNOSIS — R0602 Shortness of breath: Secondary | ICD-10-CM | POA: Diagnosis not present

## 2023-10-23 DIAGNOSIS — J449 Chronic obstructive pulmonary disease, unspecified: Secondary | ICD-10-CM | POA: Diagnosis not present

## 2023-10-28 DIAGNOSIS — G4733 Obstructive sleep apnea (adult) (pediatric): Secondary | ICD-10-CM | POA: Diagnosis not present

## 2023-10-31 ENCOUNTER — Telehealth: Payer: Self-pay | Admitting: Family Medicine

## 2023-10-31 NOTE — Telephone Encounter (Signed)
Copied from CRM [id number]. Topic: Clinical - Prescription Issue >> Oct 31, 2023  3:52 PM Corin V wrote: Reason for CRM: [facility] called stating they needed a refill on the patient furosemide (LASIX) 40 MG tablet prescription. Agent advised that it was sent to them on 10/21/23. They requested It be resent to pharmacy again as they do not have it.

## 2023-11-01 ENCOUNTER — Ambulatory Visit (INDEPENDENT_AMBULATORY_CARE_PROVIDER_SITE_OTHER): Payer: 59

## 2023-11-01 DIAGNOSIS — E538 Deficiency of other specified B group vitamins: Secondary | ICD-10-CM | POA: Diagnosis not present

## 2023-11-01 DIAGNOSIS — J9601 Acute respiratory failure with hypoxia: Secondary | ICD-10-CM | POA: Diagnosis not present

## 2023-11-01 DIAGNOSIS — G4733 Obstructive sleep apnea (adult) (pediatric): Secondary | ICD-10-CM | POA: Diagnosis not present

## 2023-11-01 DIAGNOSIS — J449 Chronic obstructive pulmonary disease, unspecified: Secondary | ICD-10-CM | POA: Diagnosis not present

## 2023-11-01 DIAGNOSIS — R0602 Shortness of breath: Secondary | ICD-10-CM | POA: Diagnosis not present

## 2023-11-01 MED ORDER — CYANOCOBALAMIN 1000 MCG/ML IJ SOLN
1000.0000 ug | Freq: Once | INTRAMUSCULAR | Status: AC
  Administered 2023-11-01: 1000 ug via INTRAMUSCULAR

## 2023-11-01 MED ORDER — FUROSEMIDE 40 MG PO TABS: ORAL_TABLET | ORAL | 2 refills | Status: DC

## 2023-11-01 NOTE — Progress Notes (Signed)
Per orders of Dr. Swaziland, injection of B12 given by Vickii Chafe on Left Deltoid. Patient tolerated injection well.

## 2023-11-01 NOTE — Addendum Note (Signed)
Addended by: Kathreen Devoid on: 11/01/2023 07:41 AM   Modules accepted: Orders

## 2023-11-02 ENCOUNTER — Encounter: Payer: Self-pay | Admitting: Internal Medicine

## 2023-11-02 NOTE — Assessment & Plan Note (Signed)
Walk test re-qualifies for POC. Plan- replace old concentrator and POC, for O2 2L at rest, sleep and exertion

## 2023-11-02 NOTE — Assessment & Plan Note (Signed)
Benefits from CPAP Plan- continue auto 5-15, replacing old machine

## 2023-11-02 NOTE — Assessment & Plan Note (Signed)
Replace old nebulizer machine and refill meds including Duoneb/ Budesonide

## 2023-11-07 ENCOUNTER — Other Ambulatory Visit: Payer: Self-pay | Admitting: Family Medicine

## 2023-11-07 DIAGNOSIS — F419 Anxiety disorder, unspecified: Secondary | ICD-10-CM

## 2023-11-20 ENCOUNTER — Telehealth: Payer: Self-pay | Admitting: Family Medicine

## 2023-11-20 NOTE — Telephone Encounter (Signed)
 Noted.

## 2023-11-20 NOTE — Telephone Encounter (Signed)
 Copied from CRM [id number]. Topic: General - Other >> Nov 20, 2023 12:17 PM Corean SAUNDERS wrote: Reason for CRM: Rokus a clinical pharmacist with [facility] states he will be re-faxing a clinical recommendation fro Dr. Jordan as they have not received confirmation that she has reviewed this.

## 2023-11-22 DIAGNOSIS — R0602 Shortness of breath: Secondary | ICD-10-CM | POA: Diagnosis not present

## 2023-11-22 DIAGNOSIS — J449 Chronic obstructive pulmonary disease, unspecified: Secondary | ICD-10-CM | POA: Diagnosis not present

## 2023-11-22 DIAGNOSIS — J9601 Acute respiratory failure with hypoxia: Secondary | ICD-10-CM | POA: Diagnosis not present

## 2023-11-23 DIAGNOSIS — J449 Chronic obstructive pulmonary disease, unspecified: Secondary | ICD-10-CM | POA: Diagnosis not present

## 2023-12-03 ENCOUNTER — Inpatient Hospital Stay: Payer: 59 | Attending: Adult Health | Admitting: Adult Health

## 2023-12-03 VITALS — BP 134/66 | HR 82 | Temp 97.1°F | Resp 16 | Wt 128.6 lb

## 2023-12-03 DIAGNOSIS — Z853 Personal history of malignant neoplasm of breast: Secondary | ICD-10-CM | POA: Diagnosis not present

## 2023-12-03 DIAGNOSIS — C50911 Malignant neoplasm of unspecified site of right female breast: Secondary | ICD-10-CM

## 2023-12-03 DIAGNOSIS — Z170 Estrogen receptor positive status [ER+]: Secondary | ICD-10-CM

## 2023-12-03 DIAGNOSIS — Z9013 Acquired absence of bilateral breasts and nipples: Secondary | ICD-10-CM | POA: Diagnosis not present

## 2023-12-03 DIAGNOSIS — C50811 Malignant neoplasm of overlapping sites of right female breast: Secondary | ICD-10-CM | POA: Diagnosis not present

## 2023-12-03 DIAGNOSIS — Z9221 Personal history of antineoplastic chemotherapy: Secondary | ICD-10-CM | POA: Insufficient documentation

## 2023-12-03 NOTE — Progress Notes (Unsigned)
[name] Cancer Follow up:    [name], [name], MD [address] [place] [address]   DIAGNOSIS: Cancer Staging  Breast cancer, right breast [name]) Staging form: Breast, [name] 7th Edition - Clinical: Stage IA (T1c, N0, M0) - Signed by Lowella Dell, MD on 02/17/2015   SUMMARY OF ONCOLOGIC HISTORY: Per Dr. [name] note from 06/21/2017:  [name] woman: history of right breast cancer: pT1c pN0, stage IA, estrogen and progesterone receptor positive.   1. S/p right mastectomy with myocutaneous flap 09/19/2009   2. Adjuvant chemo with 4 cycles of  Taxotere/ Cytoxan   3. Femara, followed by Arimidex, started roughly Jan 2011, with a few months' interruption   4.  Completed 5 years of antiestrogen therapy June 2016   5.  Also s/p left breast lumpectomy in may 2014 ,negative for atypia or malignancy   6. Intermittent thrombocytosis, mild likely multifocal factorial (borderline iron deficiency, positive ANA and elevated C-reactive protein)  CURRENT THERAPY:  INTERVAL HISTORY: [name] 78 y.o. female returns for    Patient Active Problem List   Diagnosis Date Noted  . Urine frequency 06/28/2023  . B12 deficiency 06/28/2023  . Calculus of gallbladder without cholecystitis without obstruction 05/13/2023  . Supraumbilical hernia without gangrene and without obstruction 05/13/2023  . Community acquired pneumonia 05/01/2023  . Malignant neoplasm of urinary bladder, unspecified site (HCC) 12/25/2022  . Spondylolisthesis of lumbar region 10/22/2022  . Chronic back pain 11/04/2021  . Systolic and diastolic CHF, chronic (HCC) 11/04/2021  . PVD (peripheral vascular disease) (HCC) 11/04/2021  . OSA on CPAP 11/04/2021  . Chronic diarrhea 11/04/2021  . GERD (gastroesophageal reflux disease) 11/04/2021  . CAP (community acquired pneumonia) 08/01/2021  . Sepsis due to pneumonia (HCC) 07/31/2021  . Systolic CHF, chronic (HCC) 07/31/2021  . Prediabetes  07/19/2021  . Hemoptysis 10/05/2020  . Hypokalemia 09/26/2020  . OSA (obstructive sleep apnea) 09/11/2020  . SOB (shortness of breath)   . Acute respiratory failure with hypoxia (HCC) 08/14/2020  . Esophageal dysmotility 08/14/2020  . Atherosclerosis of aorta (HCC) 08/14/2020  . Snoring 06/26/2020  . Shortness of breath   . Acute systolic CHF (congestive heart failure) (HCC) 05/21/2020  . COPD exacerbation (HCC) 05/20/2020  . Acute on chronic respiratory failure with hypoxia (HCC) 05/20/2020  . Genetic testing 12/12/2018  . Family history of breast cancer   . Family history of ovarian cancer   . Family history of pancreatic cancer   . Family history of colon cancer   . Family history of melanoma   . History of breast cancer   . Chronic respiratory failure with hypoxia (HCC) 10/14/2017  . COPD with acute exacerbation (HCC) 10/10/2017  . Flu vaccine need 09/09/2017  . Anxiety disorder 09/09/2017  . Fibromyalgia 09/09/2017  . Essential (hemorrhagic) thrombocythemia (HCC) 06/21/2017  . Malignant neoplasm of overlapping sites of right breast in female, estrogen receptor positive (HCC) 06/05/2017  . Dermatitis 04/02/2017  . Edema of right lower extremity 02/26/2017  . Positive ANA (antinuclear antibody) 02/08/2017  . Elevated IgE level and eosinophilia 12/31/2016  . Rash and nonspecific skin eruption 11/28/2016  . Allergic rhinitis 11/28/2016  . History of bacterial pneumonia 08/09/2016  . Routine general medical examination at a health care facility 08/12/2015  . Insomnia 03/04/2015  . Breast cancer, right breast (HCC) 02/17/2015  . CAD (coronary artery disease) 07/11/2011  . Tobacco use disorder 03/27/2011  . Chronic mesenteric ischemia (HCC) 03/27/2011  . Solitary pulmonary nodule 03/27/2011  .  PAD (peripheral artery disease) (HCC) 03/04/2011  . Arthropathy 11/09/2010  . Hypothyroidism 09/20/2009  . Hyperlipidemia 09/20/2009  . Hearing loss 09/14/2008  . Essential  hypertension 09/08/2007  . COPD, frequent exacerbations (HCC) 09/08/2007  . GERD 09/08/2007    is allergic to sinequan [doxepin], sulfa antibiotics, and sulfur.  MEDICAL HISTORY: Past Medical History:  Diagnosis Date  . Bilateral leg cramps   . Bladder neoplasm   . Cancer (HCC)   . CHF (congestive heart failure) (HCC)   . 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 Chronic kidney disease   . COPD, frequent exacerbations (HCC)    03-06-2018 per pt last exacerbation 12/ 2018  . Coronary artery disease    cardiologist-  dr Shirlee Latch-- 03-11-2018 er cath , 80% stenosis in the ostial second diagonal  . Dyspnea on minimal exertion   . Emphysema/COPD (HCC)    CAT score- 17  . Family history of breast cancer   . Family history of colon cancer   . Family history of melanoma   . Family history of ovarian cancer   . Family history of pancreatic cancer   . Fibromyalgia 1995  . GERD (gastroesophageal reflux disease)   . Hiatal hernia   . History of breast cancer   . History of diverticulitis of colon    2002  s/p  sigmoid colectomy  . History of multiple pulmonary nodules    hx RUL nodules x2  s/p right VATS w/ wedge resection 09-11-2005 and 06-14-2011  both  necrotizing granulomatous inflammation w/ cystic area of necrosis & focal calcification  . History of right breast cancer oncologist-  dr [name]-- no recurrence   dx 2010--  IDC, Stage IA , ER/PR+,  (pT1c pN0) 11-09-2008 right lumpectomy;  12-18-2008 right simple mastectomy for DCIS margins;  completed chemotherapy 2010 (no radiation) and completed antiestrogen therapy  . Hx of colonic polyps    Dr. Matthias Hughs -last study '11  . Hyperlipidemia   . Hypertension   . Hypothyroidism   . 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 Nocturia   . OA (osteoarthritis)    thumbs  . On supplemental oxygen therapy    03-06-2018 per pt uses only  at night,  checks O2 sats at home,  stated am sat 89% after moving around average 93-94% with RA  . Osteoporosis   . Peripheral arterial occlusive disease [name]) vascular-- dr chen/ dr Randie Heinz   proximal right SFA severe focal stenosis with collaterals from the PFA/  04/ 2012 occluded celiac and SMA arteries with distal reconstitution w/ patent IMA  . Peripheral vascular disease (HCC)    chronic DVT RLE,  mesenteric vascular disease  . Pneumonia   . Right lower lobe pulmonary nodule    Chest CT 08-29-2017  . Sleep apnea   . Stage 3 severe COPD by GOLD classification (HCC) hx frequent exacerbations--   pulmologist-  dr Inocente Salles--  per lov note , dated 12-20-2017, oxyogen 2L is prescribed for use with exertion (but pt only uses mostly at night), O2 sats on RA run the 80s , this day sat 86% RA and with 2L O2 sat 92%  . Varicose veins of leg with swelling    varicose vein surgery - Dr. Guss Bunde  . Wears glasses   . Wears hearing aid in both ears     SURGICAL HISTORY: Past Surgical History:  Procedure Laterality Date  . ABDOMINAL AORTOGRAM N/A 07/11/2020   Procedure: ABDOMINAL AORTOGRAM;  Surgeon: Maeola Harman, MD;  Location: [name] INVASIVE CV LAB;  Service: Cardiovascular;  Laterality: N/A;  . AORTIC ARCH ANGIOGRAPHY N/A 07/11/2020   Procedure: AORTIC ARCH ANGIOGRAPHY;  Surgeon: Maeola Harman, MD;  Location: [facility] INVAS[facility] CV LAB;  Service: Cardiovascular;  Laterality: N/A;  . BREAST EXCISIONAL BIOPSY Left 10/15/2008  . BREAST LUMPECTOMY W/ NEEDLE LOCALIZATION Right 11-09-2008    dr Luisa Hart   [name]  . CARDIAC CATHETERIZATION  03-12-2011   dr Shirlee Latch   70-80% ostial stenosis in small second diagonal (appears to small for intervention),  dLAD 40-50%,  minimal luminal irregulartieis involoving LCFx and RCA,  LVEF 55%  . CATARACT EXTRACTION W/ INTRAOCULAR LENS  IMPLANT, BILATERAL  2011  . CYSTOSCOPY N/A 03/11/2018   Procedure: CYSTOSCOPY WITH INSTILLATION OF POST OPERATIVE  EPIRUBICIN;  Surgeon: Jerilee Field, MD;  Location: [facility] ORS;  Service: Urology;  Laterality: N/A;  . LEFT HEART CATH AND CORONARY ANGIOGRAPHY N/A 05/25/2020   Procedure: LEFT HEART CATH AND CORONARY ANGIOGRAPHY;  Surgeon: Lennette Bihari, MD;  Location: [facility] INVASIVE CV LAB;  Service: Cardiovascular;  Laterality: N/A;  . MASTECTOMY Right   . PARTIAL COLECTOMY  2002   sigmoid and appectomy (diverticulitis)  . PARTIAL MASTECTOMY WITH NEEDLE LOCALIZATION Left 02/23/2013   Procedure:  LEFT PARTIAL MASTECTOMY WITH NEEDLE LOCALIZATION;  Surgeon: [name], MD;  Location: [name];  Service: General;  Laterality: Left;  . PERIPHERAL VASCULAR INTERVENTION  07/11/2020   Procedure: PERIPHERAL VASCULAR INTERVENTION;  Surgeon: Maeola Harman, MD;  Location: [name] CV LAB;  Service: Cardiovascular;;  . RECONSTRUCTION BREAST W/ LATISSIMUS DORSI FLAP Right 05-30-2009   dr Odis Luster  [name]  . REDUCTION MAMMAPLASTY Left   . SIMPLE MASTECTOMY Right 12-28-2008    dr Luisa Hart  [facility]  . TRANSFORAMINAL LUMBAR INTERBODY FUSION W/ MIS 1 LEVEL Left 10/22/2022   Procedure: LUMBAR THREE-FOUR MINIMALLY INVASIVE TRANSFORAMINAL LUMBAR INTERBODY FUSION;  Surgeon: Jadene Pierini, MD;  Location: [facility] OR;  Service: Neurosurgery;  Laterality: Left;  3C/RM 21 to follow  . TRANSTHORACIC ECHOCARDIOGRAM  07-09-2016  dr Shirlee Latch   ef 55-60%, grade 1 diastoic dysfunction/  mild TR  . TRANSURETHRAL RESECTION OF BLADDER TUMOR N/A 03/11/2018   Procedure: TRANSURETHRAL RESECTION OF BLADDER TUMOR (TURBT) 2-5cm;  Surgeon: Jerilee Field, MD;  Location: [facility];  Service: Urology;  Laterality: N/A;  . UPPER EXTREMITY ANGIOGRAPHY Left 07/11/2020   Procedure: Upper Extremity Angiography;  Surgeon: Maeola Harman, MD;  Location: [name] INVASIVE CV LAB;  Service: Cardiovascular;  Laterality: Left;  Marland Kitchen VAGINAL HYSTERECTOMY  1973  . VIDEO ASSISTED THORACOSCOPY (VATS)/WEDGE RESECTION Right 09-11-2005  &   06-14-2011   dr Sloan Leiter  [facility]   both RUL    SOCIAL HISTORY: Social History   Socioeconomic History  . Marital status: Married    Spouse name: Not on file  . Number of children: 3  . Years of education: 69  . Highest education level: Not on file  Occupational History  . Occupation: Magazine features editor: RETIRED    Comment: retired  Tobacco Use  . Smoking status: Former    Current packs/day: 0.00    Average packs/day: 0.3 packs/day for 50.0 years (12.5 ttl pk-yrs)    Types: Cigarettes    Start date: 05/18/1966    Quit date: 05/18/2016    Years since quitting: 7.5  . Smokeless tobacco: Never  Vaping  Use  . Vaping status: Never Used  Substance and Sexual Activity  . Alcohol use: Yes    Alcohol/week: 14.0 standard drinks of alcohol    Types: 14 Cans of beer per week    Comment: every night  . Drug use: No  . Sexual activity: Not on file  Other Topics Concern  . Not on file  Social History Narrative   ** Merged History Encounter **       HSG, beauty school. Married '69 -7 yrs/widowed; married '79 - 18yr/divorced; married '87.  2 sons - '70, '74, 1 srtep-son; 1 grandchild. 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 SO - good health.  NO history of abuse.  ACP - full code and all heroic measures.    Social Drivers of Health   Financial Resource Strain: Low Risk  (02/25/2023)   Overall Financial Resource Strain (CARDIA)   . Difficulty of Paying Living Expenses: Not hard at all  Food Insecurity: No Food Insecurity (05/08/2023)   Hunger Vital Sign   . Worried About Programme researcher, broadcasting/film/video in the Last Year: Never true   . Ran Out of Food in the Last Year: Never true  Transportation Needs: No Transportation Needs (05/08/2023)   PRAPARE - Transportation   . Lack of Transportation (Medical): No   . Lack of Transportation (Non-Medical): No  Physical Activity: Inactive (02/25/2023)   Exercise Vital Sign   . Days of Exercise per Week: 0 days   . Minutes of Exercise per Session: 0 min   Stress: No Stress Concern Present (02/25/2023)   [employer] of Occupational Health - Occupational Stress Questionnaire   . Feeling of Stress : Not at all  Social Connections: Moderately Isolated (02/25/2023)   Social Connection and Isolation Panel [NHANES]   . Frequency of Communication with Friends and Family: More than three times a week   . Frequency of Social Gatherings with Friends and Family: More than three times a week   . Attends Religious Services: Never   . Active Member of Clubs or Organizations: No   . Attends Banker Meetings: Never   . Marital Status: Married  Catering manager Violence: Not At Risk (05/01/2023)   Humiliation, Afraid, Rape, and Kick questionnaire   . Fear of Current or Ex-Partner: No   . Emotionally Abused: No   . Physically Abused: No   . Sexually Abused: No    FAMILY HISTORY: Family History  Problem Relation Age of Onset  . Colon cancer Mother        colon  . COPD Mother        brown lung  . Hypertension Mother   . Diabetes Mother   . Emphysema Mother   . Coronary artery disease Father   . Heart attack Father   . Sudden death Father   . Heart disease Father   . Cancer Sister        throat  . Hypertension Sister   . Coronary artery disease Brother   . Heart attack Brother        early 19s  . Throat cancer Sister   . Ovarian cancer Sister        fallopian tube cancer in her 35s  . Melanoma Sister   . Hypertension Sister   . Pancreatic cancer Sister   . Melanoma Niece        dx in her 19s  . Breast cancer Niece 18    Review of Systems - Oncology  PHYSICAL EXAMINATION    There were no vitals filed for this visit.  Physical Exam  LABORATORY DATA:  CBC    Component Value Date/Time   WBC 8.5 05/13/2023 1527   RBC 5.24 (H) 05/13/2023 1527   HGB 16.1 (H) 05/13/2023 1527   HGB 14.5 02/05/2023 1433   HGB 15.3 06/08/2020 1510   HGB 13.7 07/08/17 1045   HCT 49.4 (H) 05/13/2023 1527   HCT 44.5  06/08/2020 1510   HCT 42.4 07-08-17 1045   PLT 553.0 (H) 05/13/2023 1527   PLT 510 (H) 02/05/2023 1433   PLT 322 06/08/2020 1510   MCV 94.4 05/13/2023 1527   MCV 90 06/08/2020 1510   MCV 87.8 2017-07-08 1045   MCH 31.3 05/02/2023 0804   MCHC 32.6 05/13/2023 1527   RDW 14.7 05/13/2023 1527   RDW 13.0 06/08/2020 1510   RDW 14.9 (H) 07-08-17 1045   LYMPHSABS 0.6 (L) 05/01/2023 1610   LYMPHSABS 1.1 01/06/2020 1732   LYMPHSABS 1.1 July 08, 2017 1045   MONOABS 1.4 (H) 05/01/2023 1610   MONOABS 0.7 07/08/17 1045   EOSABS 0.1 05/01/2023 1610   EOSABS 0.5 (H) 01/06/2020 1732   BASOSABS 0.1 05/01/2023 1610   BASOSABS 0.1 01/06/2020 1732   BASOSABS 0.0 07/08/2017 1045    CMP     Component Value Date/Time   NA 137 05/13/2023 1527   NA 138 06/08/2020 1510   NA 141 02/17/2015 1249   K 4.0 05/13/2023 1527   K 4.3 02/17/2015 1249   CL 97 05/13/2023 1527   CL 108 (H) 08/21/2012 1501   CO2 30 05/13/2023 1527   CO2 25 02/17/2015 1249   GLUCOSE 88 05/13/2023 1527   GLUCOSE 85 02/17/2015 1249   GLUCOSE 101 (H) 08/21/2012 1501   BUN 17 05/13/2023 1527   BUN 13 06/08/2020 1510   BUN 16.6 02/17/2015 1249   CREATININE 0.88 05/13/2023 1527   CREATININE 0.87 02/05/2023 1433   CREATININE 0.95 (H) 09/26/2020 1558   CREATININE 0.8 02/17/2015 1249   CALCIUM 10.0 05/13/2023 1527   CALCIUM 9.3 02/17/2015 1249   PROT 7.6 05/13/2023 1527   PROT 7.6 06/04/2018 1200   PROT 7.2 02/17/2015 1249   ALBUMIN 4.3 05/13/2023 1527   ALBUMIN 4.6 06/04/2018 1200   ALBUMIN 3.9 02/17/2015 1249   AST 16 05/13/2023 1527   AST 24 02/05/2023 1433   AST 17 02/17/2015 1249   ALT 21 05/13/2023 1527   ALT 21 02/05/2023 1433   ALT 23 02/17/2015 1249   ALKPHOS 95 05/13/2023 1527   ALKPHOS 77 02/17/2015 1249   BILITOT 0.6 05/13/2023 1527   BILITOT 0.5 02/05/2023 1433   BILITOT 0.38 02/17/2015 1249   GFRNONAA >60 05/06/2023 0935   GFRNONAA >60 02/05/2023 1433   GFRNONAA 59 (L) 09/26/2020 1558   GFRAA 68  09/26/2020 1558       PENDING LABS:   RADIOGRAPHIC STUDIES:  No results found.   PATHOLOGY:     ASSESSMENT and THERAPY PLAN:   No problem-specific Assessment & Plan notes found for this encounter.   No orders of the defined types were placed in this encounter.   All questions were answered. The patient knows to call the clinic with any problems, questions or concerns. We can certainly see the patient much sooner if necessary. This note was electronically signed. Noreene Filbert, NP 12/03/2023

## 2023-12-06 NOTE — Assessment & Plan Note (Signed)
[name] is a 78 year old woman with history of stage IA ER/PR positive breast cancer diagnosed in 2010 s/p right mastectomy, adjuvant chemotherapy, followed by aromatase inhibitor therapy x 5 years completed in June, 2016.   [name] has completed her right-sided breast cancer treatment since 2016 and has been doing quite well.  History of right breast cancer Soreness in the area of previous left breast biopsy, which was negative for cancer. No palpable mass or hard lump on examination. Possible nerve irritation from previous biopsy. -Next mammogram due in April 2025. -Continue annual f/u and observation -RTC sooner if needed for breast changes or concerns  Asthma/COPD Fluctuating symptoms, currently not optimal. Improvement noted with recent initiation of CPAP. -Continue current management and use of CPAP.  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 No current symptoms reported. -Continue monitoring as per specialist's advice.  Pneumonia History of pneumonia in the past summer, currently no respiratory symptoms suggestive of recurrence. -Continue current management of underlying lung disease.  General Health Maintenance -Continue preventative care labs with Dr. Swaziland.

## 2023-12-08 ENCOUNTER — Inpatient Hospital Stay (HOSPITAL_COMMUNITY)
Admission: EM | Admit: 2023-12-08 | Discharge: 2023-12-11 | DRG: 190 | Disposition: A | Discharge status: Home Health | Payer: 59 | Attending: Family Medicine | Admitting: Family Medicine

## 2023-12-08 ENCOUNTER — Emergency Department (HOSPITAL_COMMUNITY): Payer: 59

## 2023-12-08 ENCOUNTER — Other Ambulatory Visit: Payer: Self-pay

## 2023-12-08 DIAGNOSIS — G4730 Sleep apnea, unspecified: Secondary | ICD-10-CM | POA: Diagnosis not present

## 2023-12-08 DIAGNOSIS — I82501 Chronic embolism and thrombosis of unspecified deep veins of right lower extremity: Secondary | ICD-10-CM | POA: Diagnosis present

## 2023-12-08 DIAGNOSIS — J9621 Acute and chronic respiratory failure with hypoxia: Secondary | ICD-10-CM | POA: Diagnosis not present

## 2023-12-08 DIAGNOSIS — Z8551 Personal history of malignant neoplasm of bladder: Secondary | ICD-10-CM

## 2023-12-08 DIAGNOSIS — J439 Emphysema, unspecified: Secondary | ICD-10-CM | POA: Diagnosis present

## 2023-12-08 DIAGNOSIS — Z9981 Dependence on supplemental oxygen: Secondary | ICD-10-CM

## 2023-12-08 DIAGNOSIS — I10 Essential (primary) hypertension: Secondary | ICD-10-CM | POA: Diagnosis not present

## 2023-12-08 DIAGNOSIS — J189 Pneumonia, unspecified organism: Secondary | ICD-10-CM

## 2023-12-08 DIAGNOSIS — Z853 Personal history of malignant neoplasm of breast: Secondary | ICD-10-CM

## 2023-12-08 DIAGNOSIS — Z7951 Long term (current) use of inhaled steroids: Secondary | ICD-10-CM | POA: Diagnosis not present

## 2023-12-08 DIAGNOSIS — E039 Hypothyroidism, unspecified: Secondary | ICD-10-CM | POA: Diagnosis present

## 2023-12-08 DIAGNOSIS — J9601 Acute respiratory failure with hypoxia: Secondary | ICD-10-CM | POA: Diagnosis not present

## 2023-12-08 DIAGNOSIS — Z9841 Cataract extraction status, right eye: Secondary | ICD-10-CM

## 2023-12-08 DIAGNOSIS — Z9842 Cataract extraction status, left eye: Secondary | ICD-10-CM

## 2023-12-08 DIAGNOSIS — Z87891 Personal history of nicotine dependence: Secondary | ICD-10-CM

## 2023-12-08 DIAGNOSIS — R059 Cough, unspecified: Secondary | ICD-10-CM | POA: Diagnosis not present

## 2023-12-08 DIAGNOSIS — Z981 Arthrodesis status: Secondary | ICD-10-CM

## 2023-12-08 DIAGNOSIS — Z825 Family history of asthma and other chronic lower respiratory diseases: Secondary | ICD-10-CM

## 2023-12-08 DIAGNOSIS — K219 Gastro-esophageal reflux disease without esophagitis: Secondary | ICD-10-CM | POA: Diagnosis not present

## 2023-12-08 DIAGNOSIS — I739 Peripheral vascular disease, unspecified: Secondary | ICD-10-CM | POA: Diagnosis present

## 2023-12-08 DIAGNOSIS — G4700 Insomnia, unspecified: Secondary | ICD-10-CM | POA: Diagnosis present

## 2023-12-08 DIAGNOSIS — I5042 Chronic combined systolic (congestive) and diastolic (congestive) heart failure: Secondary | ICD-10-CM | POA: Diagnosis present

## 2023-12-08 DIAGNOSIS — I130 Hypertensive heart and chronic kidney disease with heart failure and stage 1 through stage 4 chronic kidney disease, or unspecified chronic kidney disease: Secondary | ICD-10-CM | POA: Diagnosis not present

## 2023-12-08 DIAGNOSIS — J40 Bronchitis, not specified as acute or chronic: Secondary | ICD-10-CM | POA: Diagnosis not present

## 2023-12-08 DIAGNOSIS — Z9011 Acquired absence of right breast and nipple: Secondary | ICD-10-CM

## 2023-12-08 DIAGNOSIS — Z79899 Other long term (current) drug therapy: Secondary | ICD-10-CM

## 2023-12-08 DIAGNOSIS — Z79890 Hormone replacement therapy (postmenopausal): Secondary | ICD-10-CM

## 2023-12-08 DIAGNOSIS — E785 Hyperlipidemia, unspecified: Secondary | ICD-10-CM | POA: Diagnosis not present

## 2023-12-08 DIAGNOSIS — Z860100 Personal history of colon polyps, unspecified: Secondary | ICD-10-CM

## 2023-12-08 DIAGNOSIS — K449 Diaphragmatic hernia without obstruction or gangrene: Secondary | ICD-10-CM | POA: Diagnosis not present

## 2023-12-08 DIAGNOSIS — Z882 Allergy status to sulfonamides status: Secondary | ICD-10-CM

## 2023-12-08 DIAGNOSIS — J9611 Chronic respiratory failure with hypoxia: Secondary | ICD-10-CM | POA: Diagnosis present

## 2023-12-08 DIAGNOSIS — Z7902 Long term (current) use of antithrombotics/antiplatelets: Secondary | ICD-10-CM | POA: Diagnosis not present

## 2023-12-08 DIAGNOSIS — Z90710 Acquired absence of both cervix and uterus: Secondary | ICD-10-CM

## 2023-12-08 DIAGNOSIS — Z7982 Long term (current) use of aspirin: Secondary | ICD-10-CM | POA: Diagnosis not present

## 2023-12-08 DIAGNOSIS — J441 Chronic obstructive pulmonary disease with (acute) exacerbation: Principal | ICD-10-CM | POA: Diagnosis present

## 2023-12-08 DIAGNOSIS — Z974 Presence of external hearing-aid: Secondary | ICD-10-CM

## 2023-12-08 DIAGNOSIS — M810 Age-related osteoporosis without current pathological fracture: Secondary | ICD-10-CM | POA: Diagnosis not present

## 2023-12-08 DIAGNOSIS — Z888 Allergy status to other drugs, medicaments and biological substances status: Secondary | ICD-10-CM

## 2023-12-08 DIAGNOSIS — Z8249 Family history of ischemic heart disease and other diseases of the circulatory system: Secondary | ICD-10-CM

## 2023-12-08 DIAGNOSIS — Z9049 Acquired absence of other specified parts of digestive tract: Secondary | ICD-10-CM

## 2023-12-08 DIAGNOSIS — I2510 Atherosclerotic heart disease of native coronary artery without angina pectoris: Secondary | ICD-10-CM | POA: Diagnosis not present

## 2023-12-08 DIAGNOSIS — G4733 Obstructive sleep apnea (adult) (pediatric): Secondary | ICD-10-CM | POA: Diagnosis not present

## 2023-12-08 DIAGNOSIS — Z961 Presence of intraocular lens: Secondary | ICD-10-CM | POA: Diagnosis not present

## 2023-12-08 DIAGNOSIS — Z66 Do not resuscitate: Secondary | ICD-10-CM | POA: Diagnosis not present

## 2023-12-08 DIAGNOSIS — M797 Fibromyalgia: Secondary | ICD-10-CM | POA: Diagnosis present

## 2023-12-08 DIAGNOSIS — Z803 Family history of malignant neoplasm of breast: Secondary | ICD-10-CM

## 2023-12-08 DIAGNOSIS — R918 Other nonspecific abnormal finding of lung field: Secondary | ICD-10-CM | POA: Diagnosis not present

## 2023-12-08 LAB — CBC WITH DIFFERENTIAL/PLATELET
Abs Immature Granulocytes: 0.05 10*3/uL (ref 0.00–0.07)
Basophils Absolute: 0.1 10*3/uL (ref 0.0–0.1)
Basophils Relative: 1 %
Eosinophils Absolute: 0.2 10*3/uL (ref 0.0–0.5)
Eosinophils Relative: 1 %
HCT: 42.5 % (ref 36.0–46.0)
Hemoglobin: 13.9 g/dL (ref 12.0–15.0)
Immature Granulocytes: 0 %
Lymphocytes Relative: 7 %
Lymphs Abs: 0.9 10*3/uL (ref 0.7–4.0)
MCH: 30.5 pg (ref 26.0–34.0)
MCHC: 32.7 g/dL (ref 30.0–36.0)
MCV: 93.2 fL (ref 80.0–100.0)
Monocytes Absolute: 1.5 10*3/uL — ABNORMAL HIGH (ref 0.1–1.0)
Monocytes Relative: 11 %
Neutro Abs: 10.9 10*3/uL — ABNORMAL HIGH (ref 1.7–7.7)
Neutrophils Relative %: 80 %
Platelets: 385 10*3/uL (ref 150–400)
RBC: 4.56 MIL/uL (ref 3.87–5.11)
RDW: 15.4 % (ref 11.5–15.5)
WBC: 13.6 10*3/uL — ABNORMAL HIGH (ref 4.0–10.5)
nRBC: 0 % (ref 0.0–0.2)

## 2023-12-08 LAB — COMPREHENSIVE METABOLIC PANEL
ALT: 32 U/L (ref 0–44)
AST: 34 U/L (ref 15–41)
Albumin: 3.6 g/dL (ref 3.5–5.0)
Alkaline Phosphatase: 107 U/L (ref 38–126)
Anion gap: 9 (ref 5–15)
BUN: 15 mg/dL (ref 8–23)
CO2: 27 mmol/L (ref 22–32)
Calcium: 8.9 mg/dL (ref 8.9–10.3)
Chloride: 98 mmol/L (ref 98–111)
Creatinine, Ser: 0.84 mg/dL (ref 0.44–1.00)
GFR, Estimated: >60 mL/min (ref >60)
Glucose, Bld: 97 mg/dL (ref 70–99)
Potassium: 3.2 mmol/L — ABNORMAL LOW (ref 3.5–5.1)
Sodium: 134 mmol/L — ABNORMAL LOW (ref 135–145)
Total Bilirubin: 0.8 mg/dL (ref 0.0–1.2)
Total Protein: 7.5 g/dL (ref 6.5–8.1)

## 2023-12-08 LAB — RESP PANEL BY RT-PCR (RSV, FLU A&B, COVID)  RVPGX2
Influenza A by PCR: NEGATIVE
Influenza B by PCR: NEGATIVE
Resp Syncytial Virus by PCR: NEGATIVE
SARS Coronavirus 2 by RT PCR: NEGATIVE

## 2023-12-08 MED ORDER — IPRATROPIUM-ALBUTEROL 0.5-2.5 (3) MG/3ML IN SOLN
3.0000 mL | Freq: Once | RESPIRATORY_TRACT | Status: AC
  Administered 2023-12-08: 3 mL via RESPIRATORY_TRACT
  Filled 2023-12-08: qty 3

## 2023-12-08 MED ORDER — POTASSIUM CHLORIDE CRYS ER 20 MEQ PO TBCR
20.0000 meq | EXTENDED_RELEASE_TABLET | Freq: Two times a day (BID) | ORAL | Status: AC
  Administered 2023-12-09 (×2): 20 meq via ORAL
  Filled 2023-12-08 (×2): qty 1

## 2023-12-08 MED ORDER — SODIUM CHLORIDE 0.9 % IV SOLN
2.0000 g | Freq: Once | INTRAVENOUS | Status: AC
  Administered 2023-12-08: 2 g via INTRAVENOUS
  Filled 2023-12-08: qty 20

## 2023-12-08 MED ORDER — SODIUM CHLORIDE 3 % IN NEBU
4.0000 mL | INHALATION_SOLUTION | Freq: Two times a day (BID) | RESPIRATORY_TRACT | Status: DC
  Administered 2023-12-09 – 2023-12-11 (×3): 4 mL via RESPIRATORY_TRACT
  Filled 2023-12-08 (×6): qty 4

## 2023-12-08 MED ORDER — METHYLPREDNISOLONE SODIUM SUCC 125 MG IJ SOLR
125.0000 mg | Freq: Every day | INTRAMUSCULAR | Status: DC
  Administered 2023-12-08 – 2023-12-11 (×4): 125 mg via INTRAVENOUS
  Filled 2023-12-08 (×4): qty 2

## 2023-12-08 MED ORDER — IPRATROPIUM-ALBUTEROL 0.5-2.5 (3) MG/3ML IN SOLN
3.0000 mL | Freq: Four times a day (QID) | RESPIRATORY_TRACT | Status: DC
  Administered 2023-12-08 – 2023-12-11 (×11): 3 mL via RESPIRATORY_TRACT
  Filled 2023-12-08 (×11): qty 3

## 2023-12-08 MED ORDER — IOHEXOL 350 MG/ML SOLN
75.0000 mL | Freq: Once | INTRAVENOUS | Status: AC | PRN
  Administered 2023-12-08: 75 mL via INTRAVENOUS

## 2023-12-08 MED ORDER — GUAIFENESIN ER 600 MG PO TB12
600.0000 mg | ORAL_TABLET | Freq: Two times a day (BID) | ORAL | Status: AC
  Administered 2023-12-09 – 2023-12-11 (×6): 600 mg via ORAL
  Filled 2023-12-08 (×6): qty 1

## 2023-12-08 MED ORDER — ENOXAPARIN SODIUM 40 MG/0.4ML IJ SOSY
40.0000 mg | PREFILLED_SYRINGE | INTRAMUSCULAR | Status: DC
  Administered 2023-12-09 – 2023-12-10 (×3): 40 mg via SUBCUTANEOUS
  Filled 2023-12-08 (×3): qty 0.4

## 2023-12-08 MED ORDER — SODIUM CHLORIDE 0.9 % IV SOLN
500.0000 mg | Freq: Once | INTRAVENOUS | Status: AC
  Administered 2023-12-08: 500 mg via INTRAVENOUS
  Filled 2023-12-08: qty 5

## 2023-12-08 MED ORDER — PREDNISONE 20 MG PO TABS: 60.0000 mg | ORAL_TABLET | Freq: Once | ORAL | Status: DC

## 2023-12-08 NOTE — H&P (Signed)
 History and Physical  [name]:[id number] DOB: [birth year] DOA: 12/08/2023  Referring physician: Dr. Lockie Mola, EDP  PCP: [place], [name], MD  Outpatient Specialists: Pulmonary, cardiology. Patient coming from: Home  Chief Complaint:  Persistent productive cough.  HPI: [name] is a 78 y.o. female with medical history significant for right breast cancer: Completed 5 years of antiestrogen therapy in June 2016, OSA on CPAP, peripheral artery disease, coronary artery disease, chronic HFpEF, former tobacco user, hypothyroidism, history of chronic mesenteric ischemia, history of chronic right lower extremity DVT not on oral anticoagulation, who presents to the ER with complaints of worsening productive cough for the past 2 weeks.  Associated with shortness of breath and wheezing.  No reported anginal symptoms.  In the ER, tachypneic.  Cough is productive with copious greenish mucus.  CT angio chest was negative for pulmonary embolism however it showed findings suggestive of emphysema and worsening bronchitis.  The patient was started on empiric IV antibiotics for COPD exacerbation and possible early pneumonia.  TRH, hospitalist service, was asked to admit.  ED Course: Temperature 98.  BP 170/59, pulse 70, respiratory 22, O2 saturation 95% on 2 L.  Lab work notable for WBC 13.6, neutrophil 10.9, sodium 134, potassium 3.2.  Review of Systems: Review of systems as noted in the HPI. All other systems reviewed and are negative.   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)    05/ 2018  chronic non-occlusive DVT RLE   Chronic kidney disease    COPD, frequent exacerbations (HCC)    03-06-2018 per pt last exacerbation 12/ 2018   Coronary artery disease    cardiologist-  dr Shirlee Latch-- 03-11-2018 er cath , 80% stenosis in the  ostial second diagonal   Dyspnea on minimal exertion    Emphysema/COPD (HCC)    CAT score- 17   Family history of breast cancer    Family history of colon cancer    Family history of melanoma    Family history of ovarian cancer    Family history of pancreatic cancer    Fibromyalgia 1995   GERD (gastroesophageal reflux disease)    Hiatal hernia    History of breast cancer    History of diverticulitis of colon    2002  s/p  sigmoid colectomy   History of multiple pulmonary nodules    hx RUL nodules x2  s/p right VATS w/ wedge resection 09-11-2005 and 06-14-2011  both  necrotizing granulomatous inflammation w/ cystic area of necrosis & focal calcification   History of right breast cancer oncologist-  dr [name]-- no recurrence   dx 2010--  IDC, Stage IA , ER/PR+,  (pT1c pN0) 11-09-2008 right lumpectomy;  12-18-2008 right simple mastectomy for DCIS margins;  completed chemotherapy 2010 (no radiation) and completed antiestrogen therapy   Hx of colonic polyps    Dr. 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   Nocturia    OA (osteoarthritis)    thumbs   On supplemental oxygen therapy    03-06-2018 per pt uses only at night,  checks O2 sats at home,  stated am sat 89% after moving around average 93-94% with RA  Osteoporosis    Peripheral arterial occlusive disease (HCC) vascular-- dr chen/ dr Randie Heinz   proximal right SFA severe focal stenosis with collaterals from the PFA/  04/ 2012 occluded celiac and SMA arteries with distal reconstitution w/ patent IMA   Peripheral vascular disease (HCC)    chronic DVT RLE,  mesenteric vascular disease   Pneumonia    Right lower lobe pulmonary nodule    Chest CT 08-29-2017   Sleep apnea    Stage 3 severe COPD by GOLD classification (HCC) hx frequent exacerbations--   pulmologist-  dr Inocente Salles--  per lov note , dated 12-20-2017, oxyogen 2L is  prescribed for use with exertion (but pt only uses mostly at night), O2 sats on RA run the 80s , this day sat 86% RA and with 2L O2 sat 92%   Varicose veins of leg with swelling    varicose vein surgery - Dr. Guss Bunde   Wears glasses    Wears hearing aid in both ears    Past Surgical History:  Procedure Laterality Date   ABDOMINAL AORTOGRAM N/A 07/11/2020   Procedure: ABDOMINAL AORTOGRAM;  Surgeon: Maeola Harman, MD;  Location: [facility] INVASIVE CV LAB;  Service: Cardiovascular;  Laterality: N/A;   AORTIC ARCH ANGIOGRAPHY N/A 07/11/2020   Procedure: AORTIC ARCH ANGIOGRAPHY;  Surgeon: Maeola Harman, MD;  Location: [facility] INVASIVE CV LAB;  Service: Cardiovascular;  Laterality: N/A;   BREAST EXCISIONAL BIOPSY Left 10/15/2008   BREAST LUMPECTOMY W/ NEEDLE LOCALIZATION Right 11-09-2008    dr cornett   [facility]   CARDIAC CATHETERIZATION  03-12-2011   dr Shirlee Latch   70-80% ostial stenosis in small second diagonal (appears to small for intervention),  dLAD 40-50%,  minimal luminal irregulartieis involoving LCFx and RCA,  LVEF 55%   CATARACT EXTRACTION W/ INTRAOCULAR LENS  IMPLANT, BILATERAL  2011   CYSTOSCOPY N/A 03/11/2018   Procedure: CYSTOSCOPY WITH INSTILLATION OF POST OPERATIVE EPIRUBICIN;  Surgeon: Jerilee Field, MD;  Location: [facility] ORS;  Service: Urology;  Laterality: N/A;   LEFT HEART CATH AND CORONARY ANGIOGRAPHY N/A 05/25/2020   Procedure: LEFT HEART CATH AND CORONARY ANGIOGRAPHY;  Surgeon: Lennette Bihari, MD;  Location: [facility] INVASIVE CV LAB;  Service: Cardiovascular;  Laterality: N/A;   MASTECTOMY Right    PARTIAL COLECTOMY  2002   sigmoid and appectomy (diverticulitis)   PARTIAL MASTECTOMY WITH NEEDLE LOCALIZATION Left 02/23/2013   Procedure:  LEFT PARTIAL MASTECTOMY WITH NEEDLE LOCALIZATION;  Surgeon: [name], MD;  Location: [facility];  Service: General;  Laterality: Left;   PERIPHERAL VASCULAR INTERVENTION  07/11/2020   Procedure: PERIPHERAL VASCULAR  INTERVENTION;  Surgeon: Maeola Harman, MD;  Location: [facility] INVASIVE CV LAB;  Service: Cardiovascular;;   RECONSTRUCTION BREAST W/ LATISSIMUS DORSI FLAP Right 05-30-2009   dr Odis Luster  [facility]   REDUCTION MAMMAPLASTY Left    SIMPLE MASTECTOMY Right 12-28-2008    dr Luisa Hart  [facility]   TRANSFORAMINAL LUMBAR INTERBODY FUSION W/ MIS 1 LEVEL Left 10/22/2022   Procedure: LUMBAR THREE-FOUR MINIMALLY INVASIVE TRANSFORAMINAL LUMBAR INTERBODY FUSION;  Surgeon: Jadene Pierini, MD;  Location: [facility] OR;  Service: Neurosurgery;  Laterality: Left;  3C/RM 21 to follow   TRANSTHORACIC ECHOCARDIOGRAM  07-09-2016  dr Shirlee Latch   ef 55-60%, grade 1 diastoic dysfunction/  mild TR   TRANSURETHRAL RESECTION OF BLADDER TUMOR N/A 03/11/2018   Procedure: TRANSURETHRAL RESECTION OF BLADDER TUMOR (TURBT) 2-5cm;  Surgeon: Jerilee Field, MD;  Location: [facility];  Service: Urology;  Laterality: N/A;   UPPER  EXTREMITY ANGIOGRAPHY Left 07/11/2020   Procedure: Upper Extremity Angiography;  Surgeon: Maeola Harman, MD;  Location: [facility] INVASIVE CV LAB;  Service: Cardiovascular;  Laterality: Left;   VAGINAL HYSTERECTOMY  1973   VIDEO ASSISTED THORACOSCOPY (VATS)/WEDGE RESECTION Right 09-11-2005  &  06-14-2011   dr Sloan Leiter  [facility]   both RUL    Social History:  reports that she quit smoking about 7 years ago. Her smoking use included cigarettes. She started smoking about 57 years ago. She has a 12.5 pack-year smoking history. She has never used smokeless tobacco. She reports current alcohol use of about 14.0 standard drinks of alcohol per week. She reports that she does not use drugs.   Allergies  Allergen Reactions   Sinequan [Doxepin] Other (See Comments)    Kept her awake    Sulfa Antibiotics Swelling   Sulfur     Other reaction(s): Unknown    Family History  Problem Relation Age of Onset   Colon cancer Mother        colon   COPD Mother        brown lung   Hypertension Mother    Diabetes Mother     Emphysema Mother    Coronary artery disease Father    Heart attack Father    Sudden death Father    Heart disease Father    Cancer Sister        throat   Hypertension Sister    Coronary artery disease Brother    Heart attack Brother        early 74s   Throat cancer Sister    Ovarian cancer Sister        fallopian tube cancer in her 16s   Melanoma Sister    Hypertension Sister    Pancreatic cancer Sister    Melanoma Niece        dx in her 56s   Breast cancer Niece 27      Prior to Admission medications   Medication Sig Start Date End Date Taking? Authorizing Provider  albuterol (VENTOLIN HFA) 108 (90 Base) MCG/ACT inhaler Inhale 2 puffs into the lungs every 6 (six) hours as needed for wheezing or shortness of breath. TAKE 2 PUFFS BY MOUTH EVERY 6 HOURS AS NEEDED FOR WHEEZE OR SHORTNESS OF BREATH Strength: 108 (90 Base) MCG/ACT 09/16/23   Jetty Duhamel D, MD  ammonium lactate (AMLACTIN DAILY) 12 % lotion Apply topically as needed. 09/02/23   [provider]  aspirin EC 81 MG tablet Take 1 tablet (81 mg total) by mouth daily. 08/21/20   Lonia Blood, MD  atorvastatin (LIPITOR) 80 MG tablet Take 1 tablet (80 mg total) by mouth daily. 04/09/23   [place], [name], MD  budesonide (PULMICORT) 0.5 MG/2ML nebulizer solution Take 2 mLs (0.5 mg total) by nebulization 2 (two) times daily. 09/16/23   Waymon Budge, MD  cilostazol (PLETAL) 100 MG tablet Take 1 tablet (100 mg total) by mouth 2 (two) times daily. 08/12/23   Tereso Newcomer T, PA-C  clobetasol (TEMOVATE) 0.05 % external solution Apply 1 Application topically 2 (two) times daily. 09/02/23   [provider]  clopidogrel (PLAVIX) 75 MG tablet TAKE ONE TABLET BY MOUTH DAILY AT 9AM 08/13/23   Maeola Harman, MD  DUPIXENT 300 MG/2ML SOPN Inject 300 mg into the skin every 14 (fourteen) days. 01/03/21   [provider]  ezetimibe (ZETIA) 10 MG tablet Take 1 tablet (10 mg total) by mouth daily. 08/16/23  [place], [name], MD  FEROSUL 325 (65 Fe) MG tablet TAKE ONE TABLET BY MOUTH DAILY AT 9AM WITH BREAKFAST 10/02/23   [place], [name], MD  fluticasone [facility]) 50 MCG/ACT nasal spray SPRAY 1 SPRAY IN EACH NOSTRIL TWICE DAILY 11/08/23   [place], [name], MD  furosemide (LASIX) 40 MG tablet TAKE ONE TABLET BY MOUTH TWICE DAILY 11/01/23   [place], [name], MD  hydrOXYzine (ATARAX) 25 MG tablet Take 25 mg by mouth at bedtime.    [provider]  ipratropium-albuterol (DUONEB) 0.5-2.5 (3) MG/3ML SOLN Inhale 3 mLs into the lungs as directed. TID for 1 week and then use as needed Q6 hours. 09/16/23   Waymon Budge, MD  levothyroxine (SYNTHROID) 112 MCG tablet TAKE ONE-HALF TABLET BY MOUTH ON TUESDAY AND THURSDAY . TAKE 1  TABLET BY MOUTH ON MONDAY,  WEDNESDAY, AND FRIDAY , SATURDAY , AND SUNDAY Patient taking differently: Take 112 mcg by mouth See admin instructions. TAKE ONE-HALF TABLET BY MOUTH ON TUESDAY AND THURSDAY . TAKE 1  TABLET BY MOUTH ON MONDAY,  WEDNESDAY, AND FRIDAY , SATURDAY , AND SUNDAY 04/09/23   [place], [name], MD  LORazepam (ATIVAN) 2 MG tablet TAKE ONE TABLET (2 MG TOTAL) BY MOUTH AT BEDTIME 09/30/23   [place], [name], MD  montelukast (SINGULAIR) 10 MG tablet TAKE 1 TABLET BY MOUTH EVERYDAY AT BEDTIME Patient taking differently: Take 10 mg by mouth at bedtime. 04/21/21   Glenford Bayley, NP  pantoprazole (PROTONIX) 20 MG tablet TAKE 1 TABLET BY MOUTH AT  BEDTIME 05/28/23   [place], [name], MD  PARoxetine (PAXIL) 20 MG tablet TAKE ONE TABLET BY MOUTH DAILY AT 9AM Patient taking differently: Take 20 mg by mouth daily. 04/05/23   [place], [name], MD  potassium chloride SA (KLOR-CON M) 20 MEQ tablet TAKE 1 TABLET BY MOUTH DAILY 08/30/23   [place], [name], MD  tamsulosin (FLOMAX) 0.4 MG CAPS capsule TAKE 1 CAPSULE BY MOUTH DAILY 09/24/23   [place], [name], MD  traZODone (DESYREL) 100 MG tablet TAKE ONE TABLET BY MOUTH DAILY AT 9PM AT BEDTIME 11/08/23   [place], [name], MD  TRELEGY  ELLIPTA 200-62.5-25 MCG/ACT AEPB INHALE 1 PUFF BY MOUTH ONCE DAILY (RINSE MOUTH AFTER EACH USE) (BULK) 06/13/23   Young, Clinton D, MD  urea (CARMOL) 40 % CREA Apply 1 Application topically daily as needed. 09/09/23   [provider]    Physical Exam: BP 137/64   Pulse 78   Temp 98 F (36.7 C) (Oral)   Resp 14   Ht 5\' 6"  (1.676 m)   Wt 61.2 kg   SpO2 94%   BMI 21.79 kg/m   General: 78 y.o. year-old female well developed well nourished in no acute distress.  Alert and oriented x3. Cardiovascular: Regular rate and rhythm with no rubs or gallops.  No thyromegaly or JVD noted.  No lower extremity edema bilaterally. Respiratory: Diffuse rales bilaterally with poor inspiratory effort. Abdomen: Soft nontender nondistended with normal bowel sounds x4 quadrants. Muskuloskeletal: No cyanosis, clubbing or edema noted bilaterally Neuro: CN II-XII intact, strength, sensation, reflexes Skin: No ulcerative lesions noted or rashes Psychiatry: Judgement and insight appear normal. Mood is appropriate for condition and setting          Labs on Admission:  Basic Metabolic Panel: Recent Labs  Lab 12/08/23 1613  NA 134*  K 3.2*  CL 98  CO2 27  GLUCOSE 97  BUN 15  CREATININE 0.84  CALCIUM  8.9   Liver Function Tests: Recent Labs  Lab 12/08/23 1613  AST 34  ALT 32  ALKPHOS 107  BILITOT 0.8  PROT 7.5  ALBUMIN 3.6   No results for input(s): "LIPASE", "AMYLASE" in the last 168 hours. No results for input(s): "AMMONIA" in the last 168 hours. CBC: Recent Labs  Lab 12/08/23 1613  WBC 13.6*  NEUTROABS 10.9*  HGB 13.9  HCT 42.5  MCV 93.2  PLT 385   Cardiac Enzymes: No results for input(s): "CKTOTAL", "CKMB", "CKMBINDEX", "TROPONINI" in the last 168 hours.  BNP (last 3 results) Recent Labs    05/01/23 1610  BNP 123.7*    ProBNP (last 3 results) No results for input(s): "PROBNP" in the last 8760 hours.  CBG: No results for input(s): "GLUCAP" in the last 168  hours.  Radiological Exams on Admission: CT Angio Chest PE W and/or Wo Contrast Result Date: 12/08/2023 CLINICAL DATA:  Shortness of breath, wheezing, coughing and congestion. Pulmonary embolism suspected. EXAM: CT ANGIOGRAPHY CHEST WITH CONTRAST TECHNIQUE: Multidetector CT imaging of the chest was performed using the standard protocol during bolus administration of intravenous contrast. Multiplanar CT image reconstructions and MIPs were obtained to evaluate the vascular anatomy. RADIATION DOSE REDUCTION: This exam was performed according to the departmental dose-optimization program which includes automated exposure control, adjustment of the mA and/or kV according to patient size and/or use of iterative reconstruction technique. CONTRAST:  75mL OMNIPAQUE IOHEXOL 350 MG/ML SOLN COMPARISON:  Chest AP portable today, CTA chest 05/01/2023, CTA chest 07/31/2021. FINDINGS: Cardiovascular: The pulmonary arteries are well opacified with no evidence of arterial embolism. The pulmonary trunk upper limit of normal in caliber but stable. The heart is slightly enlarged. There are left main and three-vessel coronary artery calcifications greatest in the LAD and right coronary artery, calcification in the inferolateral mitral ring. Pulmonary veins are nondistended. There is moderate to heavy atherosclerosis in the aorta and great vessels with left subclavian artery chronic stenting. No pericardial effusion is seen. There is no aortic aneurysm, stenosis or dissection. Mediastinum/Nodes: There are few slightly prominent bilateral hilar lymph nodes up to 1.2 cm in short axis, unchanged. No mediastinal adenopathy. Thyroid gland is obscured by metallic artifact from an overlying necklace. Axillary spaces are clear. The trachea and main bronchi are patent. Thoracic esophagus is unremarkable with moderate-sized hiatal hernia once again. Lungs/Pleura: Moderate to severe emphysematous disease with centrilobular changes predominating.  No pleural effusion, thickening or pneumothorax. There is interval decreased caliber of the central airways which could be due to respiration or bronchospasm, with interval increased diffuse bronchial thickening. There is scattered subsegmental bronchial plugging in the lower lobes, left upper lobe. There are postsurgical changes and scarring in the right upper lobe, additional coarse linear scar-like markings left apex, scattered linear scar-like opacities in the lower lung fields. There is evidence of worsening bronchitis in both lungs today but no focal consolidation is seen. No mass. Upper Abdomen: Steatosis. Abdominal aortic atherosclerosis. No acute upper abdominal findings. Musculoskeletal: Unilateral right breast implant. The visualized chest wall otherwise unremarkable. There is osteopenia, mild kyphosis, degenerative changes of the thoracic spine. There is mild chronic wedging in the lower thoracic spine. No regional suspicious osseous lesion. Review of the MIP images confirms the above findings. IMPRESSION: 1. No evidence of arterial embolism. Slight cardiomegaly. No edema. 2. Interval decreased caliber of the central airways which could be due to respiration or bronchospasm, with interval increased diffuse bronchial thickening and scattered subsegmental bronchial plugging. Findings consistent with worsening bronchitis.  No focal consolidation. 3. Emphysema. 4. Aortic and coronary artery atherosclerosis. 5. Stable slightly prominent hilar lymph nodes. 6. Hiatal hernia. 7. Hepatic steatosis. Aortic Atherosclerosis (ICD10-I70.0) and Emphysema (ICD10-J43.9). Electronically Signed   By: Almira Bar M.D.   On: 12/08/2023 20:42   DG Chest Portable 1 View Result Date: 12/08/2023 CLINICAL DATA:  Cough EXAM: PORTABLE CHEST 1 VIEW COMPARISON:  05/01/2023 FINDINGS: Patient is rotated. Heart size within normal limits. Small hiatal hernia. Background of emphysema. Mild residual interstitial opacity at the right  upper lobe. New streaky bibasilar opacities. No pleural effusion or pneumothorax. IMPRESSION: 1. New streaky bibasilar opacities, which may represent atelectasis or pneumonia. 2. Mild residual interstitial opacity at the right upper lobe, possibly post infectious scarring. Electronically Signed   By: Duanne Guess D.O.   On: 12/08/2023 16:49    EKG: I independently viewed the EKG done and my findings are as followed: Sinus rhythm rate of 79.  Nonspecific ST-T changes.  QTc 448.  Assessment/Plan Present on Admission:  COPD exacerbation (HCC)  Active Problems:   COPD exacerbation (HCC)  COPD exacerbation secondary to worsening chronic bronchitis Continue Rocephin and azithromycin initiated in ED Follow sputum culture Pulmonary toilet, incentive spirometer, bronchodilators, antitussives Early mobilization.  Peripheral artery disease, coronary artery disease No anginal symptoms reported Resume home regimen  History of chronic mesenteric ischemia Seen in the past by vascular surgery No acute issues Defer to outpatient management  Chronic HFpEF Euvolemic on exam Resume home regimen Start strict I's and O's and daily weight.  Insomnia Resume home trazodone and lorazepam.    Time: 75 minutes.    DVT prophylaxis: Subcu Lovenox daily.  Code Status: DNR/DNI as stated by the patient herself.  Family Communication: None at bedside.  Disposition Plan: Admitted to telemetry unit.  Consults called: None.  Admission status: Observation status.   Status is: Observation    Darlin Drop MD [facility] Pager [phone]  If 7PM-7AM, please contact night-coverage [email or web address] Password [facility]  12/08/2023, 9:02 PM

## 2023-12-08 NOTE — ED Triage Notes (Signed)
 Patient to ED by POV with c/o SOB, wheezing, cough and congestion.

## 2023-12-08 NOTE — ED Provider Notes (Signed)
 [place] EMERGENCY DEPARTMENT AT [facility] Provider Note   CSN: [id number] Arrival date & time: 12/08/23  1452     History  Chief Complaint  Patient presents with   URI    [name] is a 78 y.o. female.  Patient with history of COPD CAD peripheral vascular disease breast cancer.  Prior DVT but not on anticoagulation.  Has had cough shortness of breath wheezing.  COPD symptoms for the last few days not getting better with breathing treatments at home.  Thick sputum production.  Shortness of breath especially when she ambulates.  She states that she had a fever greater than 100 today.  Denies any abdominal pain nausea vomiting diarrhea.  Wears oxygen at night but not with ambulation or during the day.  The history is provided by the patient.       Home Medications Prior to Admission medications   Medication Sig Start Date End Date Taking? Authorizing Provider  albuterol (VENTOLIN HFA) 108 (90 Base) MCG/ACT inhaler Inhale 2 puffs into the lungs every 6 (six) hours as needed for wheezing or shortness of breath. TAKE 2 PUFFS BY MOUTH EVERY 6 HOURS AS NEEDED FOR WHEEZE OR SHORTNESS OF BREATH Strength: 108 (90 Base) MCG/ACT 09/16/23   Jetty Duhamel D, MD  ammonium lactate (AMLACTIN DAILY) 12 % lotion Apply topically as needed. 09/02/23   [provider]  aspirin EC 81 MG tablet Take 1 tablet (81 mg total) by mouth daily. 08/21/20   Lonia Blood, MD  atorvastatin (LIPITOR) 80 MG tablet Take 1 tablet (80 mg total) by mouth daily. 04/09/23   [place], [name], MD  budesonide (PULMICORT) 0.5 MG/2ML nebulizer solution Take 2 mLs (0.5 mg total) by nebulization 2 (two) times daily. 09/16/23   Waymon Budge, MD  cilostazol (PLETAL) 100 MG tablet Take 1 tablet (100 mg total) by mouth 2 (two) times daily. 08/12/23   Tereso Newcomer T, PA-C  clobetasol (TEMOVATE) 0.05 % external solution Apply 1 Application topically 2 (two) times daily. 09/02/23   [provider]  clopidogrel (PLAVIX) 75 MG tablet TAKE ONE TABLET BY MOUTH DAILY AT 9AM 08/13/23   Maeola Harman, MD  DUPIXENT 300 MG/2ML SOPN Inject 300 mg into the skin every 14 (fourteen) days. 01/03/21   [provider]  ezetimibe (ZETIA) 10 MG tablet Take 1 tablet (10 mg total) by mouth daily. 08/16/23   [place], [name], MD  FEROSUL 325 (65 Fe) MG tablet TAKE ONE TABLET BY MOUTH DAILY AT 9AM WITH BREAKFAST 10/02/23   [place], [name], MD  fluticasone [facility]) 50 MCG/ACT nasal spray SPRAY 1 SPRAY IN EACH NOSTRIL TWICE DAILY 11/08/23   [place], [name], MD  furosemide (LASIX) 40 MG tablet TAKE ONE TABLET BY MOUTH TWICE DAILY 11/01/23   [place], [name], MD  hydrOXYzine (ATARAX) 25 MG tablet Take 25 mg by mouth at bedtime.    [provider]  ipratropium-albuterol (DUONEB) 0.5-2.5 (3) MG/3ML SOLN Inhale 3 mLs into the lungs as directed. TID for 1 week and then use as needed Q6 hours. 09/16/23   Waymon Budge, MD  levothyroxine (SYNTHROID) 112 MCG tablet TAKE ONE-HALF TABLET BY MOUTH ON TUESDAY AND THURSDAY . TAKE 1  TABLET BY MOUTH ON MONDAY,  WEDNESDAY, AND FRIDAY , SATURDAY , AND SUNDAY Patient taking differently: Take 112 mcg by mouth See admin instructions. TAKE ONE-HALF TABLET BY MOUTH ON TUESDAY AND THURSDAY . TAKE 1  TABLET BY MOUTH ON MONDAY,  WEDNESDAY, AND FRIDAY , SATURDAY , AND SUNDAY 04/09/23   [place], [name], MD  LORazepam (ATIVAN) 2 MG tablet TAKE ONE TABLET (2 MG TOTAL) BY MOUTH AT BEDTIME 09/30/23   [place], [name], MD  montelukast (SINGULAIR) 10 MG tablet TAKE 1 TABLET BY MOUTH EVERYDAY AT BEDTIME Patient taking differently: Take 10 mg by mouth at bedtime. 04/21/21   Glenford Bayley, NP  pantoprazole (PROTONIX) 20 MG tablet TAKE 1 TABLET BY MOUTH AT  BEDTIME 05/28/23   [place], [name], MD  PARoxetine (PAXIL) 20 MG tablet TAKE ONE TABLET BY MOUTH DAILY AT 9AM Patient taking differently: Take 20 mg by mouth daily. 04/05/23   [place], [name], MD  potassium chloride SA  (KLOR-CON M) 20 MEQ tablet TAKE 1 TABLET BY MOUTH DAILY 08/30/23   [place], [name], MD  tamsulosin (FLOMAX) 0.4 MG CAPS capsule TAKE 1 CAPSULE BY MOUTH DAILY 09/24/23   [place], [name], MD  traZODone (DESYREL) 100 MG tablet TAKE ONE TABLET BY MOUTH DAILY AT 9PM AT BEDTIME 11/08/23   [place], [name], MD  TRELEGY ELLIPTA 200-62.5-25 MCG/ACT AEPB INHALE 1 PUFF BY MOUTH ONCE DAILY (RINSE MOUTH AFTER EACH USE) (BULK) 06/13/23   Young, Clinton D, MD  urea (CARMOL) 40 % CREA Apply 1 Application topically daily as needed. 09/09/23   [provider]      Allergies    Sinequan [doxepin], Sulfa antibiotics, and Sulfur    Review of Systems   Review of Systems  Physical Exam Updated Vital Signs BP 137/64   Pulse 78   Temp 98 F (36.7 C) (Oral)   Resp 14   Ht 5\' 6"  (1.676 m)   Wt 61.2 kg   SpO2 94%   BMI 21.79 kg/m  Physical Exam Vitals and nursing note reviewed.  Constitutional:      General: She is not in acute distress.    Appearance: She is well-developed. She is not ill-appearing.  HENT:     Head: Normocephalic and atraumatic.     Nose: Nose normal.     Mouth/Throat:     Mouth: Mucous membranes are moist.  Eyes:     Extraocular Movements: Extraocular movements intact.     Conjunctiva/sclera: Conjunctivae normal.     Pupils: Pupils are equal, round, and reactive to light.  Cardiovascular:     Rate and Rhythm: Normal rate and regular rhythm.     Pulses: Normal pulses.     Heart sounds: Normal heart sounds. No murmur heard. Pulmonary:     Effort: Pulmonary effort is normal. No respiratory distress.     Breath sounds: Wheezing present.  Abdominal:     General: Abdomen is flat.     Palpations: Abdomen is soft.     Tenderness: There is no abdominal tenderness.  Musculoskeletal:        General: No swelling. Normal range of motion.     Cervical back: Normal range of motion and neck supple.  Skin:    General: Skin is warm and dry.     Capillary Refill: Capillary refill  takes less than 2 seconds.  Neurological:     General: No focal deficit present.     Mental Status: She is alert.  Psychiatric:        Mood and Affect: Mood normal.     ED Results / Procedures / Treatments   Labs (all labs ordered are listed, but only abnormal results are displayed) Labs Reviewed  CBC WITH DIFFERENTIAL/PLATELET - Abnormal; Notable  for the following components:      Result Value   WBC 13.6 (*)    Neutro Abs 10.9 (*)    Monocytes Absolute 1.5 (*)    All other components within normal limits  COMPREHENSIVE METABOLIC PANEL - Abnormal; Notable for the following components:   Sodium 134 (*)    Potassium 3.2 (*)    All other components within normal limits  RESP PANEL BY RT-PCR (RSV, FLU A&B, COVID)  [id number]  CULTURE, BLOOD (ROUTINE X 2)  CULTURE, BLOOD (ROUTINE X 2)    EKG EKG Interpretation Date/Time:  Sunday December 08 2023 19:49:14 EST Ventricular Rate:  79 PR Interval:  167 QRS Duration:  110 QT Interval:  390 QTC Calculation: 448 R Axis:   50  Text Interpretation: Sinus rhythm Low voltage, precordial leads Consider anterior infarct Confirmed by Virgina Norfolk [phone]) on 12/08/2023 8:01:36 PM  Radiology DG Chest Portable 1 View Result Date: 12/08/2023 CLINICAL DATA:  Cough EXAM: PORTABLE CHEST 1 VIEW COMPARISON:  05/01/2023 FINDINGS: Patient is rotated. Heart size within normal limits. Small hiatal hernia. Background of emphysema. Mild residual interstitial opacity at the right upper lobe. New streaky bibasilar opacities. No pleural effusion or pneumothorax. IMPRESSION: 1. New streaky bibasilar opacities, which may represent atelectasis or pneumonia. 2. Mild residual interstitial opacity at the right upper lobe, possibly post infectious scarring. Electronically Signed   By: Duanne Guess D.O.   On: 12/08/2023 16:49    Procedures .Critical Care  Performed by: Virgina Norfolk, DO Authorized by: Virgina Norfolk, DO   Critical care provider statement:     Critical care time (minutes):  35   Critical care was necessary to treat or prevent imminent or life-threatening deterioration of the following conditions:  Respiratory failure   Critical care was time spent personally by me on the following activities:  Blood draw for specimens, development of treatment plan with patient or surrogate, discussions with consultants, discussions with primary provider, evaluation of patient's response to treatment, examination of patient, ordering and performing treatments and interventions, ordering and review of laboratory studies, ordering and review of radiographic studies, pulse oximetry, re-evaluation of patient's condition and review of old charts   Care discussed with: admitting provider       Medications Ordered in ED Medications  methylPREDNISolone sodium succinate (SOLU-MEDROL) 125 mg/2 mL injection 125 mg (125 mg Intravenous Given 12/08/23 1949)  azithromycin (ZITHROMAX) 500 mg in sodium chloride 0.9 % 250 mL IVPB (has no administration in time range)  ipratropium-albuterol (DUONEB) 0.5-2.5 (3) MG/3ML nebulizer solution 3 mL (3 mLs Nebulization Given 12/08/23 1903)  cefTRIAXone (ROCEPHIN) 2 g in sodium chloride 0.9 % 100 mL IVPB (2 g Intravenous New Bag/Given 12/08/23 1956)  iohexol (OMNIPAQUE) 350 MG/ML injection 75 mL (75 mLs Intravenous Contrast Given 12/08/23 2010)    ED Course/ Medical Decision Making/ A&P                                 Medical Decision Making Amount and/or Complexity of Data Reviewed Labs: ordered. Radiology: ordered.  Risk Prescription drug management. Decision regarding hospitalization.   HETAL PROANO is here with cough congestion wheezing and shortness of breath.  History of COPD CAD peripheral vascular disease breast cancer.  She is not on blood thinners.  She is hypoxic upon my evaluation in the mid 80s.  She wears a CPAP oxygen at night but does not wear it during the day.  She has not been wearing oxygen during the  day she has been very short of breath having cough wheezing breathing treatments have not helped.  Differential diagnosis likely pneumonia COPD exacerbation is not having any chest pain.  She is wheezing on exam.  She had a harsh cough.  I do not suspect PE or ACS.  Will get EKG CBC BMP chest x-ray and reevaluate.  Will give breathing treatment steroids.  Chest x-ray consistent with may be pneumonia.  She has leukocytosis of 13.  She did states she had a fever earlier today.  Overall I do think she has a COPD exacerbation likely in the setting of viral process/pneumonia process.  She is got hypoxia which she normally does not have despite having oxygen at home.  She usually wears oxygen at night for CPAP support.  Ultimately I think she benefit from observation stay for IV antibiotics blood cultures breathing treatments supportive care.  I got a CT scan to further evaluate for PE upon my review and interpretation do not see any obvious large PE radiology report to follow.  Will admit to hospitalist for further care.  This chart was dictated using voice recognition software.  Despite best efforts to proofread,  errors can occur which can change the documentation meaning.         Final Clinical Impression(s) / ED Diagnoses Final diagnoses:  Acute respiratory failure with hypoxia (HCC)  COPD with acute exacerbation (HCC)  Community acquired pneumonia, unspecified laterality    Rx / DC Orders ED Discharge Orders     None         Virgina Norfolk, DO 12/08/23 2037

## 2023-12-09 DIAGNOSIS — I10 Essential (primary) hypertension: Secondary | ICD-10-CM

## 2023-12-09 DIAGNOSIS — J441 Chronic obstructive pulmonary disease with (acute) exacerbation: Secondary | ICD-10-CM | POA: Diagnosis not present

## 2023-12-09 DIAGNOSIS — J9611 Chronic respiratory failure with hypoxia: Secondary | ICD-10-CM | POA: Diagnosis not present

## 2023-12-09 LAB — EXPECTORATED SPUTUM ASSESSMENT W GRAM STAIN, RFLX TO RESP C

## 2023-12-09 MED ORDER — LEVOTHYROXINE SODIUM 112 MCG PO TABS
112.0000 ug | ORAL_TABLET | ORAL | Status: DC
  Administered 2023-12-09 – 2023-12-11 (×2): 112 ug via ORAL
  Filled 2023-12-09 (×2): qty 1

## 2023-12-09 MED ORDER — CILOSTAZOL 100 MG PO TABS
100.0000 mg | ORAL_TABLET | Freq: Two times a day (BID) | ORAL | Status: DC
  Administered 2023-12-09 – 2023-12-11 (×4): 100 mg via ORAL
  Filled 2023-12-09 (×4): qty 1

## 2023-12-09 MED ORDER — SODIUM CHLORIDE 0.9 % IV SOLN
2.0000 g | INTRAVENOUS | Status: DC
  Administered 2023-12-09 – 2023-12-10 (×2): 2 g via INTRAVENOUS
  Filled 2023-12-09 (×2): qty 20

## 2023-12-09 MED ORDER — POLYETHYLENE GLYCOL 3350 17 G PO PACK: 17.0000 g | PACK | Freq: Every day | ORAL | Status: DC | PRN

## 2023-12-09 MED ORDER — LEVOTHYROXINE SODIUM 112 MCG PO TABS
56.0000 ug | ORAL_TABLET | ORAL | Status: DC
  Administered 2023-12-10: 56 ug via ORAL
  Filled 2023-12-09: qty 1

## 2023-12-09 MED ORDER — SODIUM CHLORIDE 0.9 % IV SOLN
500.0000 mg | INTRAVENOUS | Status: DC
  Administered 2023-12-09: 500 mg via INTRAVENOUS
  Filled 2023-12-09: qty 5

## 2023-12-09 MED ORDER — ALUM & MAG HYDROXIDE-SIMETH 200-200-20 MG/5ML PO SUSP
30.0000 mL | ORAL | Status: DC | PRN
  Administered 2023-12-09 – 2023-12-10 (×2): 30 mL via ORAL
  Filled 2023-12-09 (×2): qty 30

## 2023-12-09 MED ORDER — BUDESONIDE 0.5 MG/2ML IN SUSP
0.5000 mg | Freq: Two times a day (BID) | RESPIRATORY_TRACT | Status: DC
  Administered 2023-12-09 – 2023-12-11 (×6): 0.5 mg via RESPIRATORY_TRACT
  Filled 2023-12-09 (×6): qty 2

## 2023-12-09 MED ORDER — CLOPIDOGREL BISULFATE 75 MG PO TABS
75.0000 mg | ORAL_TABLET | Freq: Every day | ORAL | Status: DC
  Administered 2023-12-10 – 2023-12-11 (×2): 75 mg via ORAL
  Filled 2023-12-09 (×3): qty 1

## 2023-12-09 MED ORDER — TRAZODONE HCL 100 MG PO TABS
100.0000 mg | ORAL_TABLET | Freq: Every day | ORAL | Status: DC
  Administered 2023-12-09 – 2023-12-10 (×3): 100 mg via ORAL
  Filled 2023-12-09 (×3): qty 1

## 2023-12-09 MED ORDER — ACETAMINOPHEN 325 MG PO TABS
650.0000 mg | ORAL_TABLET | Freq: Four times a day (QID) | ORAL | Status: DC | PRN
  Administered 2023-12-09 – 2023-12-10 (×3): 650 mg via ORAL
  Filled 2023-12-09 (×3): qty 2

## 2023-12-09 MED ORDER — LORAZEPAM 1 MG PO TABS
1.0000 mg | ORAL_TABLET | Freq: Once | ORAL | Status: AC
  Administered 2023-12-09: 1 mg via ORAL
  Filled 2023-12-09: qty 1

## 2023-12-09 MED ORDER — PROCHLORPERAZINE EDISYLATE 10 MG/2ML IJ SOLN: 5.0000 mg | Freq: Four times a day (QID) | INTRAMUSCULAR | Status: DC | PRN

## 2023-12-09 MED ORDER — ATORVASTATIN CALCIUM 40 MG PO TABS
80.0000 mg | ORAL_TABLET | Freq: Every day | ORAL | Status: DC
  Administered 2023-12-09 – 2023-12-11 (×3): 80 mg via ORAL
  Filled 2023-12-09 (×3): qty 2

## 2023-12-09 MED ORDER — PAROXETINE HCL 20 MG PO TABS
20.0000 mg | ORAL_TABLET | Freq: Every day | ORAL | Status: DC
  Administered 2023-12-09 – 2023-12-11 (×3): 20 mg via ORAL
  Filled 2023-12-09 (×3): qty 1

## 2023-12-09 MED ORDER — POTASSIUM CHLORIDE CRYS ER 20 MEQ PO TBCR
40.0000 meq | EXTENDED_RELEASE_TABLET | Freq: Once | ORAL | Status: AC
  Administered 2023-12-09: 40 meq via ORAL
  Filled 2023-12-09: qty 2

## 2023-12-09 MED ORDER — FERROUS SULFATE 325 (65 FE) MG PO TABS
325.0000 mg | ORAL_TABLET | Freq: Every day | ORAL | Status: DC
  Administered 2023-12-09 – 2023-12-11 (×3): 325 mg via ORAL
  Filled 2023-12-09 (×3): qty 1

## 2023-12-09 MED ORDER — LEVOTHYROXINE SODIUM 112 MCG PO TABS: 112.0000 ug | ORAL_TABLET | ORAL | Status: DC

## 2023-12-09 MED ORDER — ASPIRIN 81 MG PO TBEC
81.0000 mg | DELAYED_RELEASE_TABLET | Freq: Every day | ORAL | Status: DC
  Administered 2023-12-09 – 2023-12-11 (×3): 81 mg via ORAL
  Filled 2023-12-09 (×3): qty 1

## 2023-12-09 NOTE — Assessment & Plan Note (Signed)
 -  CPAP at night

## 2023-12-09 NOTE — Assessment & Plan Note (Signed)
 FEV1 36%.  On Dupixent at home. - Continue Solu-Medrol - Continue antibiotics - Continue budesonide - Continue scheduled DuoNeb - Hold Trelegy

## 2023-12-09 NOTE — Assessment & Plan Note (Signed)
 Blood pressure normal - Hold Lasix

## 2023-12-09 NOTE — Assessment & Plan Note (Signed)
 Continue levothyroxine

## 2023-12-09 NOTE — Progress Notes (Signed)
   12/09/23 2132  BiPAP/CPAP/SIPAP  Reason BIPAP/CPAP not in use Non-compliant   Pt refused cpap and does not want to use it due to sinus congestion / headaches.  Pt was encouraged to call should she change her mind.

## 2023-12-09 NOTE — Progress Notes (Signed)
  Progress Note   Patient: [name] EXB:[id number] DOB: 28-Mar-1946 DOA: 12/08/2023     0 DOS: the patient was seen and examined on 12/09/2023 at 9:55AM      Brief hospital course: 78 y.o. 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     Assessment and Plan: Systolic and diastolic CHF, chronic (HCC) Appears euvolemic, chest imaging without significant aedema. - Hold furosemide today  OSA (obstructive sleep apnea) - CPAP at night  COPD exacerbation (HCC) FEV1 36%.  On Dupixent at home. - Continue Solu-Medrol - Continue antibiotics - Continue budesonide - Continue scheduled DuoNeb - Hold Trelegy  Chronic respiratory failure with hypoxia (HCC) Continue home oxygen  PAD (peripheral artery disease) (HCC) - Continue aspirin, Lipitor, cilostazol, Plavix  Essential hypertension Blood pressure normal - Hold Lasix  Hypothyroidism - Continue levothyroxine          Subjective: patient is still quite weak and shorrt of breath.  Still on supplemental oxygen which she does not use at home.  Still wheezing considerably.     Physical Exam: BP 117/85 (BP Location: Left Arm)   Pulse 66   Temp 97.7 F (36.5 C) (Oral)   Resp 16   Ht 5\' 6"  (1.676 m)   Wt 61.2 kg   SpO2 95%   BMI 21.79 kg/m   Elderly adult female, sitting up in bed, interactive and appropriate RRR, no murmurs, no peripheral edema Respiratory effort seems increased from baseline, she is wheezing coarsely bilaterally, no rales Abdomen soft no tenderness palpation or guarding Attention normal, affect normal, judgment and insight appear normal   Data Reviewed: CBC shows increased white blood cell count Basic metabolic panel shows mild hyponatremia, mild hypokalemia    Family Communication: Husband by  phone    Disposition: Status is: Observation         Author: Alberteen Sam, MD 12/09/2023 4:02 PM  For on call review [email or web address].

## 2023-12-09 NOTE — Progress Notes (Signed)
 Pt stated she did not want to wear CPAP machine tonight.  No machine is in the room.

## 2023-12-09 NOTE — Assessment & Plan Note (Signed)
 Appears euvolemic, chest imaging without significant aedema. - Hold furosemide today

## 2023-12-09 NOTE — Evaluation (Signed)
 Physical Therapy Evaluation Patient Details Name: [name] MRN: [id number] DOB: 1946-01-21 Today's Date: 12/09/2023  History of Present Illness  [name] is a 78 y.o. female presents with worsening productive cough, SOB, wheezing. CT angio chest was negative for pulmonary embolism however it showed findings suggestive of emphysema and worsening bronchitis.PMH: right breast cancer Completed 5 years of antiestrogen therapy in June 2016, OSA on CPAP, peripheral artery disease, coronary artery disease, chronic HFpEF, former tobacco user, hypothyroidism, history of chronic mesenteric ischemia, history of chronic right lower extremity DVT not on oral anticoagulation, lumbar fusion  Clinical Impression  Pt admitted with above diagnosis. Pt from home with spouse, reports ind without AD at household level, only outings are to the grocery store, wears 2L O2 at night only, ind with self care but does fatigue easily needing seated rest breaks at baseline. On Eval, pt ind with bed mobility, needing increased time to mobilize. Pt needing supv to CGA with transfers and gait with HHA. Pt on RA noted to desat after 167ft walk to 89%, improves to 92% after seated rest break on RA. Recommend HHPT at d/c; has DME at home and spouse support. Pt currently with functional limitations due to the deficits listed below (see PT Problem List). Pt will benefit from acute skilled PT to increase their independence and safety with mobility to allow discharge.           If plan is discharge home, recommend the following: A little help with walking and/or transfers;A little help with bathing/dressing/bathroom;Assistance with cooking/housework;Assist for transportation;Help with stairs or ramp for entrance   Can travel by private vehicle        Equipment Recommendations None recommended by PT  Recommendations for Other Services       Functional Status Assessment Patient has had a recent decline in their functional  status and demonstrates the ability to make significant improvements in function in a reasonable and predictable amount of time.     Precautions / Restrictions Precautions Precautions: Fall Precaution/Restrictions Comments: monitor O2 Restrictions Weight Bearing Restrictions Per Provider Order: No      Mobility  Bed Mobility Overal bed mobility: Modified Independent             General bed mobility comments: slightly increased time, HOb eelvated on gurney    Transfers Overall transfer level: Needs assistance Equipment used: 1 person hand held assist Transfers: Sit to/from Stand Sit to Stand: Supervision           General transfer comment: supv, therapist providing HHA as needed but pt declines    Ambulation/Gait Ambulation/Gait assistance: Contact guard assist Gait Distance (Feet): 100 Feet (+36) Assistive device: 1 person hand held assist Gait Pattern/deviations: Step-through pattern, Decreased stride length Gait velocity: decreased     General Gait Details: step through gait pattern, HHA per pt request for mild unsteadiness without overt LOB, pt reports peripheral neuropathy makes her mildly unsteady with gait; pt amb 36 ft then returned to room for seated rest break, SpO2 91% after ambualtion and improves to 93% with seated rest break. Pt then amb 100 ft with HHA, again no oevrt LOB but mildly unsteady and cautious with steps and decreased cadence, SpO2 89% post amb and improves to 92% after seated rest break  Stairs            Wheelchair Mobility     Tilt Bed    Modified Rankin (Stroke Patients Only)       Balance Overall balance  assessment: Mild deficits observed, not formally tested                                           Pertinent Vitals/Pain Pain Assessment Pain Assessment: No/denies pain    Home Living Family/patient expects to be discharged to:: Private residence Living Arrangements: Spouse/significant  other Available Help at Discharge: Family;Available 24 hours/day Type of Home: House Home Access: Stairs to enter Entrance Stairs-Rails: Lawyer of Steps: 5 Alternate Level Stairs-Number of Steps: flight Home Layout: Two level;Bed/bath upstairs Home Equipment: Shower seat - built Charity fundraiser (2 wheels) (2L O2 at night only)      Prior Function Prior Level of Function : Independent/Modified Independent             Mobility Comments: pt reports ind with ambulation in the home, doesn't go out of the home often ADLs Comments: pt reports ind with self care, has cleaning lady     Extremity/Trunk Assessment   Upper Extremity Assessment Upper Extremity Assessment: Overall WFL for tasks assessed    Lower Extremity Assessment Lower Extremity Assessment: Overall WFL for tasks assessed;RLE deficits/detail;LLE deficits/detail RLE Deficits / Details: AROM WFL, strength grossly 3+/5 throughout RLE Sensation: history of peripheral neuropathy RLE Coordination: WNL LLE Deficits / Details: AROM WFL, strength grossly 3+/5 throughout LLE Sensation: history of peripheral neuropathy LLE Coordination: WNL    Cervical / Trunk Assessment Cervical / Trunk Assessment: Normal  Communication   Communication Factors Affecting Communication: Hearing impaired    Cognition Arousal: Alert Behavior During Therapy: WFL for tasks assessed/performed   PT - Cognitive impairments: No apparent impairments                         Following commands: Intact       Cueing       General Comments General comments (skin integrity, edema, etc.): Pt on 2L O2 upon arrival with SpO2 96%, placed on RA and SpO2 94% at rest, 91% on RA post 36 ft ambulation, improves to 93% with seated rest break, desat to 89% post 160ft amb, improves to 92% with seated rest break; pt requests 2L O2 at EOS with SpO2 97%    Exercises     Assessment/Plan    PT Assessment Patient needs  continued PT services  PT Problem List Decreased strength;Decreased activity tolerance;Decreased balance;Decreased knowledge of use of DME;Impaired sensation       PT Treatment Interventions DME instruction;Gait training;Stair training;Functional mobility training;Therapeutic activities;Therapeutic exercise;Balance training;Neuromuscular re-education;Cognitive remediation;Patient/family education    PT Goals (Current goals can be found in the Care Plan section)  Acute Rehab PT Goals Patient Stated Goal: improved breathing PT Goal Formulation: With patient Time For Goal Achievement: 12/23/23 Potential to Achieve Goals: Good    Frequency Min 1X/week     Co-evaluation               AM-PAC PT "6 Clicks" Mobility  Outcome Measure Help needed turning from your back to your side while in a flat bed without using bedrails?: None Help needed moving from lying on your back to sitting on the side of a flat bed without using bedrails?: None Help needed moving to and from a bed to a chair (including a wheelchair)?: A Little Help needed standing up from a chair using your arms (e.g., wheelchair or bedside chair)?: A Little Help needed  to walk in hospital room?: A Little Help needed climbing 3-5 steps with a railing? : A Lot 6 Click Score: 19    End of Session   Activity Tolerance: Patient tolerated treatment well Patient left: in bed;with call bell/phone within reach Nurse Communication: Mobility status PT Visit Diagnosis: Unsteadiness on feet (R26.81);Muscle weakness (generalized) (M62.81)    Time: 1610-9604 PT Time Calculation (min) (ACUTE ONLY): 24 min   Charges:   PT Evaluation $PT Eval Low Complexity: 1 Low PT Treatments $Gait Training: 8-22 mins PT General Charges $$ ACUTE PT VISIT: 1 Visit         Tori Nieko Clarin PT, DPT 12/09/23, 1:15 PM

## 2023-12-09 NOTE — Hospital Course (Signed)
 78 y.o. 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

## 2023-12-09 NOTE — Assessment & Plan Note (Signed)
-   Continue aspirin, Lipitor, cilostazol, Plavix

## 2023-12-09 NOTE — Assessment & Plan Note (Signed)
 Continue home oxygen

## 2023-12-10 ENCOUNTER — Encounter (HOSPITAL_COMMUNITY): Payer: Self-pay | Admitting: Internal Medicine

## 2023-12-10 DIAGNOSIS — M797 Fibromyalgia: Secondary | ICD-10-CM | POA: Diagnosis present

## 2023-12-10 DIAGNOSIS — J9621 Acute and chronic respiratory failure with hypoxia: Secondary | ICD-10-CM | POA: Diagnosis present

## 2023-12-10 DIAGNOSIS — K219 Gastro-esophageal reflux disease without esophagitis: Secondary | ICD-10-CM | POA: Diagnosis present

## 2023-12-10 DIAGNOSIS — G4733 Obstructive sleep apnea (adult) (pediatric): Secondary | ICD-10-CM | POA: Diagnosis present

## 2023-12-10 DIAGNOSIS — M810 Age-related osteoporosis without current pathological fracture: Secondary | ICD-10-CM | POA: Diagnosis present

## 2023-12-10 DIAGNOSIS — Z66 Do not resuscitate: Secondary | ICD-10-CM | POA: Diagnosis present

## 2023-12-10 DIAGNOSIS — Z8249 Family history of ischemic heart disease and other diseases of the circulatory system: Secondary | ICD-10-CM | POA: Diagnosis not present

## 2023-12-10 DIAGNOSIS — I2510 Atherosclerotic heart disease of native coronary artery without angina pectoris: Secondary | ICD-10-CM | POA: Diagnosis present

## 2023-12-10 DIAGNOSIS — J441 Chronic obstructive pulmonary disease with (acute) exacerbation: Secondary | ICD-10-CM | POA: Diagnosis present

## 2023-12-10 DIAGNOSIS — G4700 Insomnia, unspecified: Secondary | ICD-10-CM | POA: Diagnosis present

## 2023-12-10 DIAGNOSIS — I82501 Chronic embolism and thrombosis of unspecified deep veins of right lower extremity: Secondary | ICD-10-CM | POA: Diagnosis present

## 2023-12-10 DIAGNOSIS — J439 Emphysema, unspecified: Secondary | ICD-10-CM | POA: Diagnosis present

## 2023-12-10 DIAGNOSIS — G4730 Sleep apnea, unspecified: Secondary | ICD-10-CM | POA: Diagnosis present

## 2023-12-10 DIAGNOSIS — Z7902 Long term (current) use of antithrombotics/antiplatelets: Secondary | ICD-10-CM | POA: Diagnosis not present

## 2023-12-10 DIAGNOSIS — Z7951 Long term (current) use of inhaled steroids: Secondary | ICD-10-CM | POA: Diagnosis not present

## 2023-12-10 DIAGNOSIS — I739 Peripheral vascular disease, unspecified: Secondary | ICD-10-CM | POA: Diagnosis present

## 2023-12-10 DIAGNOSIS — Z9981 Dependence on supplemental oxygen: Secondary | ICD-10-CM | POA: Diagnosis not present

## 2023-12-10 DIAGNOSIS — I5042 Chronic combined systolic (congestive) and diastolic (congestive) heart failure: Secondary | ICD-10-CM | POA: Diagnosis present

## 2023-12-10 DIAGNOSIS — E785 Hyperlipidemia, unspecified: Secondary | ICD-10-CM | POA: Diagnosis present

## 2023-12-10 DIAGNOSIS — I130 Hypertensive heart and chronic kidney disease with heart failure and stage 1 through stage 4 chronic kidney disease, or unspecified chronic kidney disease: Secondary | ICD-10-CM | POA: Diagnosis present

## 2023-12-10 DIAGNOSIS — Z7982 Long term (current) use of aspirin: Secondary | ICD-10-CM | POA: Diagnosis not present

## 2023-12-10 DIAGNOSIS — Z961 Presence of intraocular lens: Secondary | ICD-10-CM | POA: Diagnosis present

## 2023-12-10 DIAGNOSIS — E039 Hypothyroidism, unspecified: Secondary | ICD-10-CM | POA: Diagnosis present

## 2023-12-10 DIAGNOSIS — Z79890 Hormone replacement therapy (postmenopausal): Secondary | ICD-10-CM | POA: Diagnosis not present

## 2023-12-10 LAB — CBC
HCT: 39.7 % (ref 36.0–46.0)
Hemoglobin: 12.7 g/dL (ref 12.0–15.0)
MCH: 30.5 pg (ref 26.0–34.0)
MCHC: 32 g/dL (ref 30.0–36.0)
MCV: 95.2 fL (ref 80.0–100.0)
Platelets: 392 10*3/uL (ref 150–400)
RBC: 4.17 MIL/uL (ref 3.87–5.11)
RDW: 15.3 % (ref 11.5–15.5)
WBC: 12.5 10*3/uL — ABNORMAL HIGH (ref 4.0–10.5)
nRBC: 0 % (ref 0.0–0.2)

## 2023-12-10 LAB — BASIC METABOLIC PANEL
Anion gap: 7 (ref 5–15)
BUN: 14 mg/dL (ref 8–23)
CO2: 25 mmol/L (ref 22–32)
Calcium: 8.8 mg/dL — ABNORMAL LOW (ref 8.9–10.3)
Chloride: 104 mmol/L (ref 98–111)
Creatinine, Ser: 0.57 mg/dL (ref 0.44–1.00)
GFR, Estimated: >60 mL/min (ref >60)
Glucose, Bld: 114 mg/dL — ABNORMAL HIGH (ref 70–99)
Potassium: 3.7 mmol/L (ref 3.5–5.1)
Sodium: 136 mmol/L (ref 135–145)

## 2023-12-10 MED ORDER — AZITHROMYCIN 250 MG PO TABS
500.0000 mg | ORAL_TABLET | Freq: Every day | ORAL | Status: DC
  Administered 2023-12-10: 500 mg via ORAL
  Filled 2023-12-10: qty 2

## 2023-12-10 MED ORDER — ENSURE ENLIVE PO LIQD
237.0000 mL | Freq: Two times a day (BID) | ORAL | Status: DC
  Administered 2023-12-10 – 2023-12-11 (×2): 237 mL via ORAL

## 2023-12-10 MED ORDER — GUAIFENESIN-DM 100-10 MG/5ML PO SYRP
5.0000 mL | ORAL_SOLUTION | ORAL | Status: DC | PRN
  Administered 2023-12-10: 5 mL via ORAL
  Filled 2023-12-10: qty 10

## 2023-12-10 MED ORDER — POTASSIUM CHLORIDE CRYS ER 20 MEQ PO TBCR
20.0000 meq | EXTENDED_RELEASE_TABLET | Freq: Every day | ORAL | Status: DC
  Administered 2023-12-10 – 2023-12-11 (×2): 20 meq via ORAL
  Filled 2023-12-10 (×2): qty 1

## 2023-12-10 MED ORDER — FUROSEMIDE 40 MG PO TABS
40.0000 mg | ORAL_TABLET | Freq: Two times a day (BID) | ORAL | Status: DC
  Administered 2023-12-10 – 2023-12-11 (×2): 40 mg via ORAL
  Filled 2023-12-10 (×2): qty 1

## 2023-12-10 MED ORDER — LOPERAMIDE HCL 2 MG PO CAPS
2.0000 mg | ORAL_CAPSULE | ORAL | Status: DC | PRN
  Administered 2023-12-10: 2 mg via ORAL
  Filled 2023-12-10: qty 1

## 2023-12-10 NOTE — TOC Transition Note (Signed)
 Transition of Care [facility]) - Discharge Note   Patient Details  Name: [name] MRN: [id number] Date of Birth: [birth year]  Transition of Care [facility] CM/SW Contact:  Larrie Kass, LCSW Phone Number: 12/10/2023, 10:06 AM   Clinical Narrative:    CSW met with the pt, MOON was given. CSW discussed recommendations for home health services. The pt is agreeable and would prefer [facility]. Pt reports that she is on home oxygen, and her family will bring a tank at the time of discharge. Pt reports no other DME needs.   The CSS sent a referral to [facility] for HHPT, but they have decline.   [facility] has accepted pt for HHPT, will need HH orders.     Barriers to Discharge: Continued Medical Work up   Patient Goals and CMS Choice Patient states their goals for this hospitalization and ongoing recovery are:: return home wiht home health CMS Medicare.gov Compare Post Acute Care list provided to:: Patient Choice offered to / list presented to : Patient      Discharge Placement                       Discharge Plan and Services Additional resources added to the After Visit Summary for                                       Social Drivers of Health (SDOH) Interventions SDOH Screenings   Food Insecurity: No Food Insecurity (12/09/2023)  Housing: Low Risk  (12/09/2023)  Transportation Needs: No Transportation Needs (12/09/2023)  Utilities: Not At Risk (12/09/2023)  Alcohol Screen: Low Risk  (02/25/2023)  Depression (PHQ2-9): Medium Risk (05/13/2023)  Financial Resource Strain: Low Risk  (02/25/2023)  Physical Activity: Inactive (02/25/2023)  Social Connections: Moderately Isolated (12/09/2023)  Stress: No Stress Concern Present (02/25/2023)  Tobacco Use: Medium Risk (11/02/2023)     Readmission Risk Interventions    05/06/2023    3:39 PM 08/02/2021   11:16 AM  Readmission Risk Prevention Plan  Transportation Screening Complete Complete  PCP or Specialist Appt within  5-7 Days Complete   Home Care Screening Complete   Medication Review (RN CM) Complete   Medication Review (RN Care Manager)  Complete  PCP or Specialist appointment within 3-5 days of discharge  Complete  HRI or Home Care Consult  Complete  SW Recovery Care/Counseling Consult  Complete  Palliative Care Screening  Not Applicable  Skilled Nursing Facility  Not Applicable

## 2023-12-10 NOTE — Progress Notes (Addendum)
 Occupational Therapy Evaluation Patient Details Name: [name] MRN: [id number] DOB: 07/30/1946 Today's Date: 12/10/2023   History of Present Illness   [name] is a 78 y.o. female presents with worsening productive cough, SOB, wheezing. CT angio chest  suggestive of emphysema and worsening bronchitis. 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 Pt desats to 85 on RA with mobility (ambulated @ 80 ft) and requires 2L to keep above 90. Increased productive coughing with mobility. Acute OT will follow for education regarding energy conservation and strategies to reduce risk of falls,  however No OT follow up recommended after DC.      If plan is discharge home, recommend the following:   Assistance with cooking/housework;Assist for transportation     Functional Status Assessment   Patient has had a recent decline in their functional status and demonstrates the ability to make significant improvements in function in a reasonable and predictable amount of time.     Equipment Recommendations   None recommended by OT     Recommendations for Other Services         Precautions/Restrictions   Precautions Precautions: Fall Precaution/Restrictions Comments: monitor O2 Restrictions Weight Bearing Restrictions Per Provider Order: No     Mobility Bed Mobility Overal bed mobility: Modified Independent                  Transfers Overall transfer level: Needs assistance Equipment used: 1 person hand held assist Transfers: Sit to/from Stand Sit  to Stand: Supervision                  Balance Overall balance assessment: Mild deficits observed, not formally tested                                         ADL either performed or assessed with clinical judgement   ADL Overall ADL's : Needs assistance/impaired                                     Functional mobility during ADLs: Supervision/safety General ADL Comments: Pt overall S with mobility; able to complete bathing/dressing/BADL however would benefit from E conservation education; began education regarding warning signs/symptoms of COPD exacerbation - handout provided.      Vision Baseline Vision/History: 0 No visual deficits       Perception         Praxis         Pertinent Vitals/Pain       Extremity/Trunk Assessment Upper Extremity Assessment Upper Extremity Assessment: Overall WFL for tasks assessed   Lower Extremity Assessment Lower Extremity Assessment: Defer to PT evaluation   Cervical / Trunk Assessment Cervical / Trunk Assessment: Normal (hx of back fusion)   Communication Communication Communication: Impaired   Cognition Arousal: Alert Behavior During Therapy: WFL for tasks assessed/performed Cognition: No apparent impairments  Following commands: Intact       Cueing  General Comments          Exercises Exercises: Other exercises Other Exercises Other Exercises: pursed lip breathing   Shoulder Instructions      Home Living Family/patient expects to be discharged to:: Private residence Living Arrangements: Spouse/significant other Available Help at Discharge: Family;Available 24 hours/day Type of Home: House Home Access: Stairs to enter [employer] of Steps: 5 Entrance Stairs-Rails: Left;Right Home Layout: Two level;Bed/bath upstairs Alternate Level Stairs-Number of Steps: flight Alternate Level Stairs-Rails: Right Bathroom  Shower/Tub: Producer, television/film/video: Handicapped height Bathroom Accessibility: Yes How Accessible: Accessible via walker Home Equipment: Shower seat - built Char[employer] fundraiser (2 wheels);Hand held shower head (2L O2 at night only)          Prior Functioning/Environment Prior Level of Function : Independent/Modified Independent             Mobility Comments: pt reports ind with ambulation in the home, doesn't go out of the home often ADLs Comments: pt reports ind with self care, has cleaning lady; family/son assists with IADL tasks    OT Problem List: Decreased activity tolerance   OT Treatment/Interventions: Self-care/ADL training;Energy conservation;Therapeutic exercise;DME and/or AE instruction;Therapeutic activities;Patient/family education      OT Goals(Current goals can be found in the care plan section)   Acute Rehab OT Goals Patient Stated Goal: to feel better OT Goal Formulation: With patient Time For Goal Achievement: 12/24/23 Potential to Achieve Goals: Good   OT Frequency:  Min 1X/week    Co-evaluation              AM-PAC OT "6 Clicks" Daily Activity     Outcome Measure Help from another person eating meals?: None Help from another person taking care of personal grooming?: None Help from another person toileting, which includes using toliet, bedpan, or urinal?: None Help from another person bathing (including washing, rinsing, drying)?: None Help from another person to put on and taking off regular upper body clothing?: None Help from another person to put on and taking off regular lower body clothing?: None 6 Click Score: 24   End of Session Equipment Utilized During Treatment: Gait belt;Oxygen (2L) Nurse Communication: Mobility status;Other (comment) (Pt asking about use of Flonase; lasix; would benefit form incentive spirometer)  Activity Tolerance: Patient tolerated treatment well Patient left: in chair;with call bell/phone within  reach  OT Visit Diagnosis: Muscle weakness (generalized) (M62.81);Unsteadiness on feet (R26.81)                Time: 0981-1914 OT Time Calculation (min): 28 min Charges:  OT Evaluation $OT Eval Low Complexity: 1 Low OT Treatments $Self Care/Home Management : 8-22 mins  Luisa Dago, OT/L   Acute OT Clinical Specialist Acute Rehabilitation Services Pager [phone] Office [phone]   [facility] 12/10/2023, 1:08 PM

## 2023-12-10 NOTE — Progress Notes (Signed)
  Progress Note   Patient: [name]:[id number] DOB: [birth year] DOA: 12/08/2023     0 DOS: the patient was seen and examined on 12/10/2023 at 11:52AM      Brief hospital course: 78 y.o. 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     Assessment and Plan: * COPD exacerbation (HCC) FEV1 36% in 2018.  On Dupixent, Trelegy, Flonase, Singulair at home.  Desaturated on 6-minute walk test last December, supposed to be on home oxygen but only uses it "as needed".  Still significantly symptomatic and wheezy today. -Continue Solu-Medrol - Continue Rocephin and azithromycin - Continue budesonide, scheduled DuoNebs - Hold Trelegy - Continue home Singulair and pantoprazole   Systolic and diastolic CHF, chronic (HCC) Appears euvolemic.  Chest imaging without edema - Resume home furosemide  OSA (obstructive sleep apnea) Not able to use CPAP.  Given nasal congestion  Chronic respiratory failure with hypoxia (HCC) Baseline is that she does not her use her prescribed oxygen.  Family reports that at baseline her O2 at rest is 93%.  Here it is less than 90.  PAD (peripheral artery disease) (HCC) - Continue aspirin, Lipitor, cilostazol, Plavix  Essential hypertension Blood pressure normal - Resume Lasix  Hypothyroidism - Continue levothyroxine          Subjective: Patient is feeling worse today, more chest tightness, more coughing, still very weak.,  Confusion, hemoptysis.     Physical Exam: BP (!) 103/58 (BP Location: Left Arm)   Pulse 74   Temp 97.6 F (36.4 C) (Oral)   Resp 16   Ht 5\' 6"  (1.676 m)   Wt 61.2 kg   SpO2 90%   BMI 21.79 kg/m   Thin adult female, sitting up in bed, interactive and appropriate RRR, no murmurs, no peripheral edema She seems short of  breath with talking, she has wheezing, coarsely bilaterally, no rales Abdomen soft no tenderness palpation Attention normal, affect normal, judgment and insight appear normal    Data Reviewed: Outside records reviewed Patient metabolic panel normal CBC shows white count of 12 Sputum culture pending  Family Communication: None at the bedside   Disposition: Status is: Inpatient Patient was admitted for COPD exacerbation in the setting of advanced COPD, FEV1 36%.  She is on an increased amount of oxygen from her baseline, and still significantly symptomatic  Will continue steroids, antibiotics, scheduled bronchodilators, and hopefully she will be ready for discharge in the next 24 hours with prednisone taper and oral antibiotics          Author: Alberteen Sam, MD 12/10/2023 12:41 PM  For on call review [email or web address].

## 2023-12-10 NOTE — Care Management Obs Status (Signed)
 MEDICARE OBSERVATION STATUS NOTIFICATION   Patient Details  Name: [name] MRN: [id number] Date of Birth: 1946/08/25   Medicare Observation Status Notification Given:  Yes    Larrie Kass, LCSW 12/10/2023, 9:19 AM

## 2023-12-11 ENCOUNTER — Other Ambulatory Visit (HOSPITAL_COMMUNITY): Payer: Self-pay

## 2023-12-11 DIAGNOSIS — J441 Chronic obstructive pulmonary disease with (acute) exacerbation: Secondary | ICD-10-CM | POA: Diagnosis not present

## 2023-12-11 LAB — BASIC METABOLIC PANEL
Anion gap: 11 (ref 5–15)
BUN: 17 mg/dL (ref 8–23)
CO2: 23 mmol/L (ref 22–32)
Calcium: 9.2 mg/dL (ref 8.9–10.3)
Chloride: 101 mmol/L (ref 98–111)
Creatinine, Ser: 0.62 mg/dL (ref 0.44–1.00)
GFR, Estimated: >60 mL/min (ref >60)
Glucose, Bld: 95 mg/dL (ref 70–99)
Potassium: 3.9 mmol/L (ref 3.5–5.1)
Sodium: 135 mmol/L (ref 135–145)

## 2023-12-11 LAB — CBC
HCT: 43.5 % (ref 36.0–46.0)
Hemoglobin: 13.7 g/dL (ref 12.0–15.0)
MCH: 30.2 pg (ref 26.0–34.0)
MCHC: 31.5 g/dL (ref 30.0–36.0)
MCV: 95.8 fL (ref 80.0–100.0)
Platelets: 346 10*3/uL (ref 150–400)
RBC: 4.54 MIL/uL (ref 3.87–5.11)
RDW: 15.3 % (ref 11.5–15.5)
WBC: 7.6 10*3/uL (ref 4.0–10.5)
nRBC: 0 % (ref 0.0–0.2)

## 2023-12-11 LAB — CULTURE, RESPIRATORY W GRAM STAIN

## 2023-12-11 MED ORDER — AZITHROMYCIN 250 MG PO TABS
250.0000 mg | ORAL_TABLET | Freq: Every day | ORAL | 0 refills | Status: AC
  Filled 2023-12-11: qty 3, 3d supply, fill #0

## 2023-12-11 MED ORDER — PREDNISONE 50 MG PO TABS
50.0000 mg | ORAL_TABLET | Freq: Every day | ORAL | 0 refills | Status: AC
  Filled 2023-12-11: qty 4, 4d supply, fill #0

## 2023-12-11 NOTE — Discharge Summary (Signed)
 Physician Discharge Summary  [name] ZOX:[id number] DOB: [birth year] DOA: 12/08/2023  PCP: [place], [name], MD  Admit date: 12/08/2023 Discharge date: 12/11/2023 30 Day Unplanned Readmission Risk Score    Flowsheet Row ED to Hosp-Admission (Current) from 12/08/2023 in [facility]  30 Day Unplanned Readmission Risk Score (%) 21.74 Filed at 12/11/2023 0801       This score is the patient's risk of an unplanned readmission within 30 days of being discharged (0 -100%). The score is based on dignosis, age, lab data, medications, orders, and past utilization.   Low:  0-14.9   Medium: 15-21.9   High: 22-29.9   Extreme: 30 and above          Admitted From: Home Disposition: Home  Recommendations for Outpatient Follow-up:  Follow up with PCP in 1-2 weeks Please obtain BMP/CBC in one week Please follow up with your PCP on the following pending results: Unresulted Labs (From admission, onward)     Start     Ordered   12/15/23 0500  Creatinine, serum  (enoxaparin (LOVENOX)    CrCl >/= 30 ml/min)  Weekly,   R     Comments: while on enoxaparin therapy    12/08/23 2152              Home Health: Yes Equipment/Devices: Home ox  Discharge Condition: Stable CODE STATUS: DNR  Subjective: Seen and examined.  Mild shortness of breath but much improved compared to yesterday.  She is in agreement with going home today.  Brief/Interim Summary: 78 y.o. 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  No evidence of pneumonia on the chest x-ray or  CT angio.  PE ruled out.  Patient was started on both Rocephin and Zithromax, unsure of the cause of that as there was no evidence of pneumonia.  Regardless, patient has improved significantly, she has been requiring no oxygen at rest but 2 L of oxygen with walking today.  She does use 2 L of oxygen at night.  She is agreeable and comfortable going home and will wean herself to room air gradually.  Advised to continue to use incentive spirometry at home.  Will prescribe 3 more days of azithromycin and 4 more days of prednisone.   Systolic and diastolic CHF, chronic (HCC) Appears euvolemic.  Chest imaging without edema - Resume home furosemide   OSA (obstructive sleep apnea) Not able to use CPAP.  Given nasal congestion     PAD (peripheral artery disease) (HCC) - Continue aspirin, Lipitor, cilostazol, Plavix   Essential hypertension Blood pressure normal - Resume Lasix   Hypothyroidism - Continue levothyroxine    Discharge plan was discussed with patient and/or family member and they verbalized understanding and agreed with it.  Discharge Diagnoses:  Principal Problem:   COPD exacerbation (HCC) Active Problems:   Hypothyroidism   Essential hypertension   PAD (peripheral artery disease) (HCC)   Chronic respiratory failure with hypoxia (HCC)   OSA (obstructive sleep apnea)   Systolic and diastolic CHF, chronic (HCC)    Discharge Instructions   Allergies as of  12/11/2023       Reactions   Sinequan [doxepin] Other (See Comments)   Kept her awake    Sulfa Antibiotics Swelling   Sulfur    Other reaction(s): Unknown        Medication List     TAKE these medications    albuterol 108 (90 Base) MCG/ACT inhaler Commonly known as: VENTOLIN HFA Inhale 2 puffs into the lungs every 6 (six) hours as needed for wheezing or shortness of breath. TAKE 2 PUFFS BY MOUTH EVERY 6 HOURS AS NEEDED FOR WHEEZE OR SHORTNESS OF BREATH Strength: 108 (90 Base) MCG/ACT   Amlactin Daily 12 %  lotion Generic drug: ammonium lactate Apply 1 Application topically daily.   aspirin EC 81 MG tablet Take 1 tablet (81 mg total) by mouth daily.   atorvastatin 80 MG tablet Commonly known as: LIPITOR Take 1 tablet (80 mg total) by mouth daily.   azithromycin 250 MG tablet Commonly known as: ZITHROMAX Take 1 tablet (250 mg total) by mouth daily for 3 days.   budesonide 0.5 MG/2ML nebulizer solution Commonly known as: PULMICORT Take 2 mLs (0.5 mg total) by nebulization 2 (two) times daily.   cilostazol 100 MG tablet Commonly known as: PLETAL Take 1 tablet (100 mg total) by mouth 2 (two) times daily.   clobetasol 0.05 % external solution Commonly known as: TEMOVATE Apply 1 Application topically 2 (two) times daily.   clopidogrel 75 MG tablet Commonly known as: PLAVIX TAKE ONE TABLET BY MOUTH DAILY AT 9AM   Dupixent 300 MG/2ML Soaj Generic drug: Dupilumab Inject 300 mg into the skin every 14 (fourteen) days.   ezetimibe 10 MG tablet Commonly known as: ZETIA Take 1 tablet (10 mg total) by mouth daily.   FeroSul 325 (65 Fe) MG tablet Generic drug: ferrous sulfate TAKE ONE TABLET BY MOUTH DAILY AT 9AM WITH BREAKFAST   fluticasone 50 MCG/ACT nasal spray Commonly known as: FLONASE SPRAY 1 SPRAY IN EACH NOSTRIL TWICE DAILY   furosemide 40 MG tablet Commonly known as: LASIX TAKE ONE TABLET BY MOUTH TWICE DAILY   hydrOXYzine 25 MG tablet Commonly known as: ATARAX Take 25 mg by mouth at bedtime.   ipratropium-albuterol 0.5-2.5 (3) MG/3ML Soln Commonly known as: DUONEB Inhale 3 mLs into the lungs as directed. TID for 1 week and then use as needed Q6 hours. What changed:  when to take this reasons to take this additional instructions   levothyroxine 112 MCG tablet Commonly known as: SYNTHROID TAKE ONE-HALF TABLET BY MOUTH ON TUESDAY AND THURSDAY . TAKE 1  TABLET BY MOUTH ON MONDAY,  WEDNESDAY, AND FRIDAY , SATURDAY , AND SUNDAY What changed:  how much to take how  to take this when to take this additional instructions   LORazepam 2 MG tablet Commonly known as: ATIVAN TAKE ONE TABLET (2 MG TOTAL) BY MOUTH AT BEDTIME   montelukast 10 MG tablet Commonly known as: SINGULAIR TAKE 1 TABLET BY MOUTH EVERYDAY AT BEDTIME What changed: See the new instructions.   pantoprazole 20 MG tablet Commonly known as: PROTONIX TAKE 1 TABLET BY MOUTH AT  BEDTIME   PARoxetine 20 MG tablet Commonly known as: PAXIL TAKE ONE TABLET BY MOUTH DAILY AT 9AM What changed: See the new instructions.   potassium chloride SA 20 MEQ tablet Commonly known as: KLOR-CON M TAKE 1 TABLET BY MOUTH DAILY   predniSONE 50 MG tablet Commonly known as: DELTASONE Take 1 tablet (50 mg total) by mouth daily with breakfast for 4 days.  tamsulosin 0.4 MG Caps capsule Commonly known as: FLOMAX TAKE 1 CAPSULE BY MOUTH DAILY   traZODone 100 MG tablet Commonly known as: DESYREL TAKE ONE TABLET BY MOUTH DAILY AT 9PM AT BEDTIME   Trelegy Ellipta 200-62.5-25 MCG/ACT Aepb Generic drug: Fluticasone-Umeclidin-Vilant INHALE 1 PUFF BY MOUTH ONCE DAILY (RINSE MOUTH AFTER EACH USE) (BULK)   urea 40 % Crea Commonly known as: CARMOL Apply 1 Application topically daily as needed (eczema).        Follow-up Information     [facility] Follow up.   Why: Home Health Agency Contact information: [address] [place] [address] [phone]         [place], [name], MD Follow up in 1 week(s).   Specialty: Family Medicine Contact information: [address] [place] [address] [phone]                Allergies  Allergen Reactions   Sinequan [Doxepin] Other (See Comments)    Kept her awake    Sulfa Antibiotics Swelling   Sulfur     Other reaction(s): Unknown    Consultations: None   Procedures/Studies: CT Angio Chest PE W and/or Wo Contrast Result Date: 12/08/2023 CLINICAL DATA:  Shortness of breath, wheezing, coughing and  congestion. Pulmonary embolism suspected. EXAM: CT ANGIOGRAPHY CHEST WITH CONTRAST TECHNIQUE: Multidetector CT imaging of the chest was performed using the standard protocol during bolus administration of intravenous contrast. Multiplanar CT image reconstructions and MIPs were obtained to evaluate the vascular anatomy. RADIATION DOSE REDUCTION: This exam was performed according to the departmental dose-optimization program which includes automated exposure control, adjustment of the mA and/or kV according to patient size and/or use of iterative reconstruction technique. CONTRAST:  75mL OMNIPAQUE IOHEXOL 350 MG/ML SOLN COMPARISON:  Chest AP portable today, CTA chest 05/01/2023, CTA chest 07/31/2021. FINDINGS: Cardiovascular: The pulmonary arteries are well opacified with no evidence of arterial embolism. The pulmonary trunk upper limit of normal in caliber but stable. The heart is slightly enlarged. There are left main and three-vessel coronary artery calcifications greatest in the LAD and right coronary artery, calcification in the inferolateral mitral ring. Pulmonary veins are nondistended. There is moderate to heavy atherosclerosis in the aorta and great vessels with left subclavian artery chronic stenting. No pericardial effusion is seen. There is no aortic aneurysm, stenosis or dissection. Mediastinum/Nodes: There are few slightly prominent bilateral hilar lymph nodes up to 1.2 cm in short axis, unchanged. No mediastinal adenopathy. Thyroid gland is obscured by metallic artifact from an overlying necklace. Axillary spaces are clear. The trachea and main bronchi are patent. Thoracic esophagus is unremarkable with moderate-sized hiatal hernia once again. Lungs/Pleura: Moderate to severe emphysematous disease with centrilobular changes predominating. No pleural effusion, thickening or pneumothorax. There is interval decreased caliber of the central airways which could be due to respiration or bronchospasm, with  interval increased diffuse bronchial thickening. There is scattered subsegmental bronchial plugging in the lower lobes, left upper lobe. There are postsurgical changes and scarring in the right upper lobe, additional coarse linear scar-like markings left apex, scattered linear scar-like opacities in the lower lung fields. There is evidence of worsening bronchitis in both lungs today but no focal consolidation is seen. No mass. Upper Abdomen: Steatosis. Abdominal aortic atherosclerosis. No acute upper abdominal findings. Musculoskeletal: Unilateral right breast implant. The visualized chest wall otherwise unremarkable. There is osteopenia, mild kyphosis, degenerative changes of the thoracic spine. There is mild chronic wedging in the lower thoracic spine. No regional  suspicious osseous lesion. Review of the MIP images confirms the above findings. IMPRESSION: 1. No evidence of arterial embolism. Slight cardiomegaly. No edema. 2. Interval decreased caliber of the central airways which could be due to respiration or bronchospasm, with interval increased diffuse bronchial thickening and scattered subsegmental bronchial plugging. Findings consistent with worsening bronchitis. No focal consolidation. 3. Emphysema. 4. Aortic and coronary artery atherosclerosis. 5. Stable slightly prominent hilar lymph nodes. 6. Hiatal hernia. 7. Hepatic steatosis. Aortic Atherosclerosis (ICD10-I70.0) and Emphysema (ICD10-J43.9). Electronically Signed   By: Almira Bar M.D.   On: 12/08/2023 20:42   DG Chest Portable 1 View Result Date: 12/08/2023 CLINICAL DATA:  Cough EXAM: PORTABLE CHEST 1 VIEW COMPARISON:  05/01/2023 FINDINGS: Patient is rotated. Heart size within normal limits. Small hiatal hernia. Background of emphysema. Mild residual interstitial opacity at the right upper lobe. New streaky bibasilar opacities. No pleural effusion or pneumothorax. IMPRESSION: 1. New streaky bibasilar opacities, which may represent atelectasis or  pneumonia. 2. Mild residual interstitial opacity at the right upper lobe, possibly post infectious scarring. Electronically Signed   By: Duanne Guess D.O.   On: 12/08/2023 16:49     Discharge Exam: Vitals:   12/11/23 0509 12/11/23 0736  BP: (!) 147/80   Pulse: 82   Resp: 18   Temp: 98 F (36.7 C)   SpO2: 94% 98%   Vitals:   12/10/23 1338 12/10/23 2016 12/11/23 0509 12/11/23 0736  BP: 127/65 (!) 135/91 (!) 147/80   Pulse: 83 65 82   Resp: 18 18 18    Temp: (!) 97.4 F (36.3 C) 98 F (36.7 C) 98 F (36.7 C)   TempSrc: Oral Oral Oral   SpO2: 98% 93% 94% 98%  Weight:      Height:        General: Pt is alert, awake, not in acute distress Cardiovascular: RRR, S1/S2 +, no rubs, no gallops Respiratory: Mild end expiratory wheezes Abdominal: Soft, NT, ND, bowel sounds + Extremities: no edema, no cyanosis    The results of significant diagnostics from this hospitalization (including imaging, microbiology, ancillary and laboratory) are listed below for reference.     Microbiology: Recent Results (from the past 240 hours)  Resp panel by RT-PCR (RSV, Flu A&B, Covid) Anterior Nasal Swab     Status: None   Collection Time: 12/08/23  3:55 PM   Specimen: Anterior Nasal Swab  Result Value Ref Range Status   SARS Coronavirus 2 by RT PCR NEGATIVE NEGATIVE Final    Comment: (NOTE) SARS-CoV-2 target nucleic acids are NOT DETECTED.  The SARS-CoV-2 RNA is generally detectable in upper respiratory specimens during the acute phase of infection. The lowest concentration of SARS-CoV-2 viral copies this assay can detect is 138 copies/mL. A negative result does not preclude SARS-Cov-2 infection and should not be used as the sole basis for treatment or other patient management decisions. A negative result may occur with  improper specimen collection/handling, submission of specimen other than nasopharyngeal swab, presence of viral mutation(s) within the areas targeted by this assay, and  inadequate number of viral copies(<138 copies/mL). A negative result must be combined with clinical observations, patient history, and epidemiological information. The expected result is Negative.  Fact Sheet for Patients:  BloggerCourse.com  Fact Sheet for Healthcare Providers:  SeriousBroker.it  This test is no t yet approved or cleared by the [place] FDA and  has been authorized for detection and/or diagnosis of SARS-CoV-2 by FDA under an Emergency Use Authorization (EUA). This EUA will remain  in effect (meaning this test can be used) for the duration of the COVID-19 declaration under Section 564(b)(1) of the Act, 21 U.S.C.section 360bbb-3(b)(1), unless the authorization is terminated  or revoked sooner.       Influenza A by PCR NEGATIVE NEGATIVE Final   Influenza B by PCR NEGATIVE NEGATIVE Final    Comment: (NOTE) The Xpert Xpress SARS-CoV-2/FLU/RSV plus assay is intended as an aid in the diagnosis of influenza from Nasopharyngeal swab specimens and should not be used as a sole basis for treatment. Nasal washings and aspirates are unacceptable for Xpert Xpress SARS-CoV-2/FLU/RSV testing.  Fact Sheet for Patients: BloggerCourse.com  Fact Sheet for Healthcare Providers: SeriousBroker.it  This test is not yet approved or cleared by the [place] FDA and has been authorized for detection and/or diagnosis of SARS-CoV-2 by FDA under an Emergency Use Authorization (EUA). This EUA will remain in effect (meaning this test can be used) for the duration of the COVID-19 declaration under Section 564(b)(1) of the Act, 21 U.S.C. section 360bbb-3(b)(1), unless the authorization is terminated or revoked.     Resp Syncytial Virus by PCR NEGATIVE NEGATIVE Final    Comment: (NOTE) Fact Sheet for Patients: BloggerCourse.com  Fact Sheet for Healthcare  Providers: SeriousBroker.it  This test is not yet approved or cleared by the [place] FDA and has been authorized for detection and/or diagnosis of SARS-CoV-2 by FDA under an Emergency Use Authorization (EUA). This EUA will remain in effect (meaning this test can be used) for the duration of the COVID-19 declaration under Section 564(b)(1) of the Act, 21 U.S.C. section 360bbb-3(b)(1), unless the authorization is terminated or revoked.  Performed at [facility], [address] [place], [place] [address]   Blood culture (routine x 2)     Status: None (Preliminary result)   Collection Time: 12/08/23  5:43 PM   Specimen: BLOOD  Result Value Ref Range Status   Specimen Description   Final    BLOOD SITE NOT SPECIFIED Performed at [facility], [address] [place], [place] [address]    Special Requests   Final    BOTTLES DRAWN AEROBIC AND ANAEROBIC Blood Culture adequate volume Performed at [facility], [address] [place], [place] [address]    Culture   Final    NO GROWTH 3 DAYS Performed at [facility], [address] [place], [place] [address]    Report Status PENDING  Incomplete  Blood culture (routine x 2)     Status: None (Preliminary result)   Collection Time: 12/08/23  5:48 PM   Specimen: BLOOD  Result Value Ref Range Status   Specimen Description   Final    BLOOD SITE NOT SPECIFIED Performed at [facility], [address] [place], [place] [address]    Special Requests   Final    BOTTLES DRAWN AEROBIC AND ANAEROBIC Blood Culture results may not be optimal due to an inadequate volume of blood received in culture bottles Performed at [facility], [address], [place], [place] [address]    Culture   Final    NO GROWTH 3 DAYS Performed at [facility], [address], [place], [place] [address]    Report Status PENDING   Incomplete  Expectorated Sputum Assessment w Gram Stain, Rflx to Resp Cult     Status: None   Collection Time: 12/08/23  9:54 PM   Specimen: Expectorated Sputum  Result Value Ref Range  Status   Specimen Description EXPECTORATED SPUTUM  Final   Special Requests EXPECTORATED SPUTUM  Final   Sputum evaluation   Final    THIS SPECIMEN IS ACCEPTABLE FOR SPUTUM CULTURE Performed at [facility], [address], [place], [place] [address]    Report Status 12/09/2023 FINAL  Final  Culture, Respiratory w Gram Stain     Status: None (Preliminary result)   Collection Time: 12/08/23  9:54 PM  Result Value Ref Range Status   Specimen Description   Final    EXPECTORATED SPUTUM Performed at [facility], [address] [place], [place] [address]    Special Requests   Final    EXPECTORATED SPUTUM Reflexed from [id number] Performed at [facility], [address], [place], [place] [address]    Gram Stain   Final    FEW WBC PRESENT, PREDOMINANTLY PMN RARE GRAM POSITIVE COCCI IN PAIRS RARE GRAM POSITIVE RODS    Culture   Final    CULTURE REINCUBATED FOR BETTER GROWTH Performed at [facility], [address] [place], [place] [address]    Report Status PENDING  Incomplete     Labs: BNP (last 3 results) Recent Labs    05/01/23 1610  BNP 123.7*   Basic Metabolic Panel: Recent Labs  Lab 12/08/23 1613 12/10/23 0400 12/11/23 0551  NA 134* 136 135  K 3.2* 3.7 3.9  CL 98 104 101  CO2 27 25 23   GLUCOSE 97 114* 95  BUN 15 14 17   CREATININE 0.84 0.57 0.62  CALCIUM 8.9 8.8* 9.2   Liver Function Tests: Recent Labs  Lab 12/08/23 1613  AST 34  ALT 32  ALKPHOS 107  BILITOT 0.8  PROT 7.5  ALBUMIN 3.6   No results for input(s): "LIPASE", "AMYLASE" in the last 168 hours. No results for input(s): "AMMONIA" in the last 168 hours. CBC: Recent Labs  Lab 12/08/23 1613 12/10/23 0400 12/11/23 0551  WBC 13.6* 12.5* 7.6   NEUTROABS 10.9*  --   --   HGB 13.9 12.7 13.7  HCT 42.5 39.7 43.5  MCV 93.2 95.2 95.8  PLT 385 392 346   Cardiac Enzymes: No results for input(s): "CKTOTAL", "CKMB", "CKMBINDEX", "TROPONINI" in the last 168 hours. BNP: Invalid input(s): "POCBNP" CBG: No results for input(s): "GLUCAP" in the last 168 hours. D-Dimer No results for input(s): "DDIMER" in the last 72 hours. Hgb A1c No results for input(s): "HGBA1C" in the last 72 hours. Lipid Profile No results for input(s): "CHOL", "HDL", "LDLCALC", "TRIG", "CHOLHDL", "LDLDIRECT" in the last 72 hours. Thyroid function studies No results for input(s): "TSH", "T4TOTAL", "T3FREE", "THYROIDAB" in the last 72 hours.  Invalid input(s): "FREET3" Anemia work up No results for input(s): "VITAMINB12", "FOLATE", "FERRITIN", "TIBC", "IRON", "RETICCTPCT" in the last 72 hours. Urinalysis    Component Value Date/Time   COLORURINE YELLOW 07/31/2021 1244   APPEARANCEUR CLEAR 07/31/2021 1244   LABSPEC 1.011 07/31/2021 1244   LABSPEC 1.030 03/08/2009 1016   PHURINE 5.0 07/31/2021 1244   GLUCOSEU NEGATIVE 07/31/2021 1244   GLUCOSEU NEGATIVE 09/07/2009 0752   HGBUR NEGATIVE 07/31/2021 1244   HGBUR negative 11/09/2010 0958   BILIRUBINUR NEGATIVE 07/31/2021 1244   BILIRUBINUR Negative 03/08/2009 1016   KETONESUR NEGATIVE 07/31/2021 1244   PROTEINUR NEGATIVE 07/31/2021 1244   UROBILINOGEN 0.2 03/23/2013 0248   NITRITE NEGATIVE 07/31/2021 1244   LEUKOCYTESUR TRACE (A) 07/31/2021 1244   LEUKOCYTESUR Negative 03/08/2009 1016   Sepsis Labs Recent Labs  Lab  12/08/23 1613 12/10/23 0400 12/11/23 0551  WBC 13.6* 12.5* 7.6   Microbiology Recent Results (from the past 240 hours)  Resp panel by RT-PCR (RSV, Flu A&B, Covid) Anterior Nasal Swab     Status: None   Collection Time: 12/08/23  3:55 PM   Specimen: Anterior Nasal Swab  Result Value Ref Range Status   SARS Coronavirus 2 by RT PCR NEGATIVE NEGATIVE Final    Comment: (NOTE) SARS-CoV-2  target nucleic acids are NOT DETECTED.  The SARS-CoV-2 RNA is generally detectable in upper respiratory specimens during the acute phase of infection. The lowest concentration of SARS-CoV-2 viral copies this assay can detect is 138 copies/mL. A negative result does not preclude SARS-Cov-2 infection and should not be used as the sole basis for treatment or other patient management decisions. A negative result may occur with  improper specimen collection/handling, submission of specimen other than nasopharyngeal swab, presence of viral mutation(s) within the areas targeted by this assay, and inadequate number of viral copies(<138 copies/mL). A negative result must be combined with clinical observations, patient history, and epidemiological information. The expected result is Negative.  Fact Sheet for Patients:  BloggerCourse.com  Fact Sheet for Healthcare Providers:  SeriousBroker.it  This test is no t yet approved or cleared by the [place] FDA and  has been authorized for detection and/or diagnosis of SARS-CoV-2 by FDA under an Emergency Use Authorization (EUA). This EUA will remain  in effect (meaning this test can be used) for the duration of the COVID-19 declaration under Section 564(b)(1) of the Act, 21 U.S.C.section 360bbb-3(b)(1), unless the authorization is terminated  or revoked sooner.       Influenza A by PCR NEGATIVE NEGATIVE Final   Influenza B by PCR NEGATIVE NEGATIVE Final    Comment: (NOTE) The Xpert Xpress SARS-CoV-2/FLU/RSV plus assay is intended as an aid in the diagnosis of influenza from Nasopharyngeal swab specimens and should not be used as a sole basis for treatment. Nasal washings and aspirates are unacceptable for Xpert Xpress SARS-CoV-2/FLU/RSV testing.  Fact Sheet for Patients: BloggerCourse.com  Fact Sheet for Healthcare  Providers: SeriousBroker.it  This test is not yet approved or cleared by the [place] FDA and has been authorized for detection and/or diagnosis of SARS-CoV-2 by FDA under an Emergency Use Authorization (EUA). This EUA will remain in effect (meaning this test can be used) for the duration of the COVID-19 declaration under Section 564(b)(1) of the Act, 21 U.S.C. section 360bbb-3(b)(1), unless the authorization is terminated or revoked.     Resp Syncytial Virus by PCR NEGATIVE NEGATIVE Final    Comment: (NOTE) Fact Sheet for Patients: BloggerCourse.com  Fact Sheet for Healthcare Providers: SeriousBroker.it  This test is not yet approved or cleared by the [place] FDA and has been authorized for detection and/or diagnosis of SARS-CoV-2 by FDA under an Emergency Use Authorization (EUA). This EUA will remain in effect (meaning this test can be used) for the duration of the COVID-19 declaration under Section 564(b)(1) of the Act, 21 U.S.C. section 360bbb-3(b)(1), unless the authorization is terminated or revoked.  Performed at [facility], [address], [place], [place] [address]   Blood culture (routine x 2)     Status: None (Preliminary result)   Collection Time: 12/08/23  5:43 PM   Specimen: BLOOD  Result Value Ref Range Status   Specimen Description   Final    BLOOD SITE NOT SPECIFIED Performed at [facility], [address] [place], [place]  [address]    Special Requests   Final    BOTTLES DRAWN AEROBIC AND ANAEROBIC Blood Culture adequate volume Performed at [facility], [address], [place], [place] [address]    Culture   Final    NO GROWTH 3 DAYS Performed at [facility], [address], [place], [place] [address]    Report Status PENDING  Incomplete  Blood culture (routine x 2)     Status: None (Preliminary  result)   Collection Time: 12/08/23  5:48 PM   Specimen: BLOOD  Result Value Ref Range Status   Specimen Description   Final    BLOOD SITE NOT SPECIFIED Performed at [facility], [address] [place], [place] [address]    Special Requests   Final    BOTTLES DRAWN AEROBIC AND ANAEROBIC Blood Culture results may not be optimal due to an inadequate volume of blood received in culture bottles Performed at [facility], [address] [place], [place] [address]    Culture   Final    NO GROWTH 3 DAYS Performed at [facility], [address], [place], [place] [address]    Report Status PENDING  Incomplete  Expectorated Sputum Assessment w Gram Stain, Rflx to Resp Cult     Status: None   Collection Time: 12/08/23  9:54 PM   Specimen: Expectorated Sputum  Result Value Ref Range Status   Specimen Description EXPECTORATED SPUTUM  Final   Special Requests EXPECTORATED SPUTUM  Final   Sputum evaluation   Final    THIS SPECIMEN IS ACCEPTABLE FOR SPUTUM CULTURE Performed at [facility], [address], [place], [place] [address]    Report Status 12/09/2023 FINAL  Final  Culture, Respiratory w Gram Stain     Status: None (Preliminary result)   Collection Time: 12/08/23  9:54 PM  Result Value Ref Range Status   Specimen Description   Final    EXPECTORATED SPUTUM Performed at [facility], [address], [place], [place] [address]    Special Requests   Final    EXPECTORATED SPUTUM Reflexed from [phone] Performed at [facility], [address] [place], [place] [address]    Gram Stain   Final    FEW WBC PRESENT, PREDOMINANTLY PMN RARE GRAM POSITIVE COCCI IN PAIRS RARE GRAM POSITIVE RODS    Culture   Final    CULTURE REINCUBATED FOR BETTER GROWTH Performed at [facility], [address], [place], [place] [address]    Report Status PENDING  Incomplete    FURTHER DISCHARGE  INSTRUCTIONS:   Get Medicines reviewed and adjusted: Please take all your medications with you for your next visit with your Primary MD   Laboratory/radiological data: Please request your Primary MD to go over all hospital tests and procedure/radiological results at the follow up, please ask your Primary MD to get all Hospital records sent to his/her office.   In some cases, they will be blood work, cultures and biopsy results pending at the time of your discharge. Please request that your primary care M.D. goes through all the records of your hospital data and follows up on these results.   Also Note the following: If you experience worsening of your admission symptoms, develop shortness of breath, life threatening emergency, suicidal or homicidal thoughts you must seek medical attention immediately by calling 911 or calling your MD immediately  if symptoms less severe.  You must read complete instructions/literature along with all the possible adverse reactions/side effects for all the Medicines you take and that have been prescribed to you. Take any new Medicines after you have completely understood and accpet all the possible adverse reactions/side effects.    Do not drive when taking Pain medications or sleeping medications (Benzodaizepines)   Do not take more than prescribed Pain, Sleep and Anxiety Medications. It is not advisable to combine anxiety,sleep and pain medications without talking with your primary care practitioner   Special Instructions: If you have smoked or chewed Tobacco  in the last 2 yrs please stop smoking, stop any regular Alcohol  and or any Recreational drug use.   Wear Seat belts while driving.   Please note: You were cared for by a hospitalist during your hospital stay. Once you are discharged, your primary care physician will handle any further medical issues. Please note that NO REFILLS for any discharge medications will be authorized once you are discharged,  as it is imperative that you return to your primary care physician (or establish a relationship with a primary care physician if you do not have one) for your post hospital discharge needs so that they can reassess your need for medications and monitor your lab values  Time coordinating discharge: Over 30 minutes  SIGNED:   Hughie Closs, MD  [facility] 12/11/2023, 9:36 AM *Please note that this is a verbal dictation therefore any spelling or grammatical errors are due to the "Dragon Medical One" system interpretation. If 7PM-7AM, please contact night-coverage [email or web address]

## 2023-12-11 NOTE — Progress Notes (Signed)
 SATURATION QUALIFICATIONS: (This note is used to comply with regulatory documentation for home oxygen)  Patient Saturations on Room Air at Rest = 90%  Patient Saturations on Room Air while Ambulating = 88%  Patient Saturations on 2 Liters of oxygen while Ambulating = 90% to 91%  Please briefly explain why patient needs home oxygen: Pt ambulated in hall with this RN Tolerated ambulation. Pt oxygen saturation on room air while ambulating dropped to 88%. 2L O2 [place] applied and oxygen saturation increased to 90%. MD notified.

## 2023-12-11 NOTE — Progress Notes (Signed)
 Discharge instructions reviewed with patient. All questions answered. All belongings accounted for. Patient to follow up with MD in  1 weeks. Patient medications hand delivered from outpatient pharmacy by this RN. PIV removed.  Ride enroute to transport home

## 2023-12-12 ENCOUNTER — Telehealth: Payer: Self-pay

## 2023-12-12 NOTE — Transitions of Care (Post Inpatient/ED Visit) (Signed)
   12/12/2023  Name: [name] MRN: [id number] DOB: Jul 31, 1946  Today's TOC FU Call Status: Today's TOC FU Call Status:: Unsuccessful Call (1st Attempt) Unsuccessful Call (1st Attempt) Date: 12/12/23  Attempted to reach the patient regarding the most recent Inpatient/ED visit. Unable to Leave CM, Auto Connected call with no answer. PCP follow up noted in EPIC to be on 12/27/23 with labwork needed.   Follow Up Plan: Additional outreach attempts will be made to reach the patient to complete the Transitions of Care (Post Inpatient/ED visit) call.    Gabriel Cirri MSN, RN RN Case Sales executive Health  [facility]Population [facility] Office Hours Wed/Thur  8:00 am-6:00 pm Direct Dial: [phone] Main Phone [phone]  Fax: [phone] [email or web address]

## 2023-12-13 ENCOUNTER — Telehealth: Payer: Self-pay | Admitting: *Deleted

## 2023-12-13 ENCOUNTER — Encounter: Payer: Self-pay | Admitting: *Deleted

## 2023-12-13 LAB — CULTURE, BLOOD (ROUTINE X 2)
Culture: NO GROWTH
Culture: NO GROWTH
Special Requests: ADEQUATE

## 2023-12-13 NOTE — Transitions of Care (Post Inpatient/ED Visit) (Signed)
 12/13/2023  Name: [name] MRN: [id number] DOB: 29-Aug-1946  Today's TOC FU Call Status: Today's TOC FU Call Status:: Successful TOC FU Call Completed TOC FU Call Complete Date: 12/13/23 Patient's Name and Date of Birth confirmed.  Transition Care Management Follow-up Telephone Call Date of Discharge: 12/11/23 Discharge Facility: [facility]) Type of Discharge: Inpatient Admission Primary Inpatient Discharge Diagnosis:: COPD exacerbation How have you been since you were released from the hospital?:  (appetite "slowly coming back, drinking enough fluids", ambulating without difficulty, no issues bowel/ bladder, weighing daily, using inhalers as prescribed) Any questions or concerns?: No  Items Reviewed: Did you receive and understand the discharge instructions provided?: Yes Medications obtained,verified, and reconciled?: Yes (Medications Reviewed) Any new allergies since your discharge?: No Dietary orders reviewed?: Yes Type of Diet Ordered:: heart healthy Do you have support at home?: Yes People in Home: spouse Name of Support/Comfort Primary Source: [name]  spouse Patient declined enrollment in [facility] 30 day program Reviewed COPD and Heart Failure action plan, importance of calling provider early on for change in health status, symptoms Patient reports she weighs daily, uses oxygen at 2 liters 24/7  Medications Reviewed Today: Medications Reviewed Today     Reviewed by Audrie Gallus, RN (Registered Nurse) on 12/13/23 at 1531  Med List Status: <None>   Medication Order Taking? Sig Documenting Provider Last Dose Status Informant  albuterol (VENTOLIN HFA) 108 (90 Base) MCG/ACT inhaler [id number] Yes Inhale 2 puffs into the lungs every 6 (six) hours as needed for wheezing or shortness of breath. TAKE 2 PUFFS BY MOUTH EVERY 6 HOURS AS NEEDED FOR WHEEZE OR SHORTNESS OF BREATH Strength: 108 (90 Base) MCG/ACT Waymon Budge, MD Taking Active Self, Pharmacy Records   ammonium lactate (AMLACTIN DAILY) 12 % lotion [id number] Yes Apply 1 Application topically daily. [provider] Taking Active Self, Pharmacy Records  aspirin EC 81 MG tablet [id number] Yes Take 1 tablet (81 mg total) by mouth daily. Lonia Blood, MD Taking Active Self, Pharmacy Records  atorvastatin (LIPITOR) 80 MG tablet [id number] Yes Take 1 tablet (80 mg total) by mouth daily. [place], [name], MD Taking Active Self, Pharmacy Records  azithromycin [facility]) 250 MG tablet [id number] Yes Take 1 tablet (250 mg total) by mouth daily for 3 days. Hughie Closs, MD Taking Active   budesonide (PULMICORT) 0.5 MG/2ML nebulizer solution [id number] Yes Take 2 mLs (0.5 mg total) by nebulization 2 (two) times daily. Waymon Budge, MD Taking Active Self, Pharmacy Records  cilostazol (PLETAL) 100 MG tablet [id number] Yes Take 1 tablet (100 mg total) by mouth 2 (two) times daily. Beatrice Lecher, PA-C Taking Active Self, Pharmacy Records  clobetasol (TEMOVATE) 0.05 % external solution [id number] Yes Apply 1 Application topically 2 (two) times daily. [provider] Taking Active Self, Pharmacy Records  clopidogrel (PLAVIX) 75 MG tablet [id number] Yes TAKE ONE TABLET BY MOUTH DAILY AT Trey Paula, MD Taking Active Self, Pharmacy Records  DUPIXENT 300 MG/2ML SOPN [id number] Yes Inject 300 mg into the skin every 14 (fourteen) days. [provider] Taking Active Self, Pharmacy Records  ezetimibe (ZETIA) 10 MG tablet [id number] Yes Take 1 tablet (10 mg total) by mouth daily. [place], [name], MD Taking Active Self, Pharmacy Records  FEROSUL 325 [phone] Fe) MG tablet [id number] Yes TAKE ONE TABLET BY MOUTH DAILY AT 9AM WITH [place] Carol [place], [name], MD Taking Active Self, Pharmacy Records  fluticasone [facility]) 50 MCG/ACT nasal spray [id number] Yes SPRAY 1 SPRAY  IN EACH NOSTRIL TWICE DAILY [place], [name], MD Taking Active Self, Pharmacy Records  furosemide (LASIX) 40 MG tablet  [id number] Yes TAKE ONE TABLET BY MOUTH TWICE DAILY [place], [name], MD Taking Active Self, Pharmacy Records  hydrOXYzine (ATARAX) 25 MG tablet [id number] Yes Take 25 mg by mouth at bedtime. [provider] Taking Active Self, Pharmacy Records  ipratropium-albuterol (DUONEB) 0.5-2.5 (3) MG/3ML SOLN [id number] Yes Inhale 3 mLs into the lungs as directed. TID for 1 week and then use as needed Q6 hours.  Patient taking differently: Inhale 3 mLs into the lungs every 6 (six) hours as needed (wheezing/SOB).   Waymon Budge, MD Taking Active Self, Pharmacy Records  levothyroxine (SYNTHROID) 112 MCG tablet [id number] Yes TAKE ONE-HALF TABLET BY MOUTH ON TUESDAY AND THURSDAY . TAKE 1  TABLET BY MOUTH ON MONDAY,  WEDNESDAY, AND FRIDAY , SATURDAY , AND SUNDAY  Patient taking differently: Take 56-112 mcg by mouth See admin instructions. Take 56 mcg by mouth on Tuesday and Thursday. Take 112 mcg by mouth on Monday, Wednesday, Friday, Saturday, and Sunday.   [place], [name], MD Taking Active Self, Pharmacy Records  LORazepam (ATIVAN) 2 MG tablet [id number] Yes TAKE ONE TABLET (2 MG TOTAL) BY MOUTH AT BEDTIME [place], [name], MD Taking Active Self, Pharmacy Records  montelukast (SINGULAIR) 10 MG tablet [id number] Yes TAKE 1 TABLET BY MOUTH EVERYDAY AT BEDTIME  Patient taking differently: Take 10 mg by mouth at bedtime.   Glenford Bayley, NP Taking Active Self, Pharmacy Records  pantoprazole (PROTONIX) 20 MG tablet [id number] Yes TAKE 1 TABLET BY MOUTH AT  BEDTIME [place], [name], MD Taking Active Self, Pharmacy Records  PARoxetine (PAXIL) 20 MG tablet [id number] Yes TAKE ONE TABLET BY MOUTH DAILY AT 9AM  Patient taking differently: Take 20 mg by mouth daily.   [place], [name], MD Taking Active Self, Pharmacy Records  potassium chloride [facility]) 20 MEQ tablet [id number] Yes TAKE 1 TABLET BY MOUTH DAILY [place], [name], MD Taking Active Self, Pharmacy Records  predniSONE (DELTASONE) 50 MG tablet  [id number] Yes Take 1 tablet (50 mg total) by mouth daily with breakfast for 4 days. Hughie Closs, MD Taking Active   tamsulosin (FLOMAX) 0.4 MG CAPS capsule [id number] Yes TAKE 1 CAPSULE BY MOUTH DAILY [place], [name], MD Taking Active Self, Pharmacy Records  traZODone (DESYREL) 100 MG tablet [id number] Yes TAKE ONE TABLET BY MOUTH DAILY AT 9PM AT BEDTIME [place], [name], MD Taking Active Self, Pharmacy Records  [facility] ELLIPTA 200-62.5-25 MCG/ACT AEPB [id number] Yes INHALE 1 PUFF BY MOUTH ONCE DAILY (RINSE MOUTH AFTER EACH USE) (BULK) Waymon Budge, MD Taking Active Self, Pharmacy Records  urea (CARMOL) 40 % CREA [id number] Yes Apply 1 Application topically daily as needed (eczema). [provider] Taking Active Self, Pharmacy Records            Home Care and Equipment/Supplies: Were Home Health Services Ordered?: Yes Name of Home Health Agency:: [facility] Has Agency set up a time to come to your home?: No EMR reviewed for Home Health Orders:  (pt states she does not want home health services and if receives a call she will cancel) Any new equipment or medical supplies ordered?: No  Functional Questionnaire: Do you need assistance with bathing/showering or dressing?: No Do you need assistance with meal preparation?: No Do you need assistance with eating?: No Do you have difficulty maintaining continence: No Do you need assistance with getting out of bed/getting out of  a chair/moving?: No Do you have difficulty managing or taking your medications?: No  Follow up appointments reviewed: PCP Follow-up appointment confirmed?: Yes Date of PCP follow-up appointment?:  (RN Care Manager offered to try and make pt a sooner appointment and pt declines stating she will keep the current appointment) Follow-up Provider: Betty [place] MD at 2 pm Specialist Hospital Follow-up appointment confirmed?: Yes Date of Specialist follow-up appointment?: 01/16/24 Follow-Up Specialty Provider:: Dr.  Fannie Knee, pulmonary  @ 330 pm Do you need transportation to your follow-up appointment?: No Do you understand care options if your condition(s) worsen?: Yes-patient verbalized understanding  SDOH Interventions Today    Flowsheet Row Most Recent Value  SDOH Interventions   Food Insecurity Interventions Intervention Not Indicated  Housing Interventions Intervention Not Indicated  Transportation Interventions Intervention Not Indicated  Utilities Interventions Intervention Not Indicated       Irving Shows [facility], BSN RN Care Manager/ Transition of Care [place]/ [facility] Population Health [phone]

## 2023-12-14 DIAGNOSIS — G4733 Obstructive sleep apnea (adult) (pediatric): Secondary | ICD-10-CM | POA: Diagnosis not present

## 2023-12-20 DIAGNOSIS — R0602 Shortness of breath: Secondary | ICD-10-CM | POA: Diagnosis not present

## 2023-12-20 DIAGNOSIS — J449 Chronic obstructive pulmonary disease, unspecified: Secondary | ICD-10-CM | POA: Diagnosis not present

## 2023-12-20 DIAGNOSIS — J9601 Acute respiratory failure with hypoxia: Secondary | ICD-10-CM | POA: Diagnosis not present

## 2023-12-21 DIAGNOSIS — J449 Chronic obstructive pulmonary disease, unspecified: Secondary | ICD-10-CM | POA: Diagnosis not present

## 2023-12-27 ENCOUNTER — Ambulatory Visit: Payer: 59 | Admitting: Family Medicine

## 2023-12-27 ENCOUNTER — Encounter: Payer: Self-pay | Admitting: Family Medicine

## 2023-12-27 VITALS — BP 126/70 | HR 71 | Temp 97.5°F | Resp 16 | Ht 66.0 in | Wt 130.8 lb

## 2023-12-27 DIAGNOSIS — K219 Gastro-esophageal reflux disease without esophagitis: Secondary | ICD-10-CM | POA: Diagnosis not present

## 2023-12-27 DIAGNOSIS — F419 Anxiety disorder, unspecified: Secondary | ICD-10-CM

## 2023-12-27 DIAGNOSIS — G4700 Insomnia, unspecified: Secondary | ICD-10-CM | POA: Diagnosis not present

## 2023-12-27 DIAGNOSIS — C679 Malignant neoplasm of bladder, unspecified: Secondary | ICD-10-CM | POA: Diagnosis not present

## 2023-12-27 DIAGNOSIS — E038 Other specified hypothyroidism: Secondary | ICD-10-CM | POA: Diagnosis not present

## 2023-12-27 DIAGNOSIS — E538 Deficiency of other specified B group vitamins: Secondary | ICD-10-CM | POA: Diagnosis not present

## 2023-12-27 DIAGNOSIS — K529 Noninfective gastroenteritis and colitis, unspecified: Secondary | ICD-10-CM | POA: Diagnosis not present

## 2023-12-27 DIAGNOSIS — E785 Hyperlipidemia, unspecified: Secondary | ICD-10-CM | POA: Diagnosis not present

## 2023-12-27 DIAGNOSIS — Z0000 Encounter for general adult medical examination without abnormal findings: Secondary | ICD-10-CM | POA: Diagnosis not present

## 2023-12-27 MED ORDER — PANTOPRAZOLE SODIUM 20 MG PO TBEC: 20.0000 mg | DELAYED_RELEASE_TABLET | Freq: Two times a day (BID) | ORAL | 3 refills | Status: DC

## 2023-12-27 MED ORDER — CYANOCOBALAMIN 1000 MCG/ML IJ SOLN
1000.0000 ug | Freq: Once | INTRAMUSCULAR | Status: AC
  Administered 2023-12-27: 1000 ug via INTRAMUSCULAR

## 2023-12-27 NOTE — Assessment & Plan Note (Signed)
 Stable. Continue Paroxetine 20 mg daily and Lorazepam 2 mg daily. She is on Trazodone for sleep, she she tolerated well, understands the risk for interaction.

## 2023-12-27 NOTE — Patient Instructions (Addendum)
 A few things to remember from today's visit:  Routine general medical examination at a health care facility  Gastroesophageal reflux disease, unspecified whether esophagitis present - Plan: pantoprazole (PROTONIX) 20 MG tablet, Ambulatory referral to Gastroenterology  Essential hypertension - Plan: Hepatic function panel  Insomnia, unspecified type  Other specified hypothyroidism - Plan: TSH, T4, free  B12 deficiency - Plan: Vitamin B12  No changes today.  If you need refills for medications you take chronically, please call your pharmacy. Do not use My Chart to request refills or for acute issues that need immediate attention. If you send a my chart message, it may take a few days to be addressed, specially if I am not in the office.  Please be sure medication list is accurate. If a new problem present, please set up appointment sooner than planned today.

## 2023-12-27 NOTE — Assessment & Plan Note (Signed)
 Last TSH 0.53 in 04/2023. Continue Levothyroxine 112 mcg daily x 5 d, 1/2 tab Thursdays and Tuesdays.  Further recommendations according to TSH result.

## 2023-12-27 NOTE — Assessment & Plan Note (Signed)
 Today after verbal consent she received B12 1000 mcg IM. Continue B12 1000 mcg IM q 4-5 weeks.  Further recommendations according to B12 result.

## 2023-12-27 NOTE — Assessment & Plan Note (Addendum)
 With associated nausea and vomiting. She has hx cholelithiasis, has been evaluated by surgeon. Her gastroenterologist's practice has moved, so she needs to establish with a new provider.

## 2023-12-27 NOTE — Assessment & Plan Note (Signed)
 Stable. On Trazodone 100 mg at bedtime and Lorazepam 2 mg at bedtime.

## 2023-12-27 NOTE — Assessment & Plan Note (Addendum)
 We discussed the importance of staying active, physically and mentally, as well as the benefits of a healthy/balance diet. Low impact exercise that involve stretching and strengthing are ideal. Preventive guidelines reviewed. Vaccines up to date. Next CPE in a year.

## 2023-12-27 NOTE — Assessment & Plan Note (Signed)
Follows with urologist annually.

## 2023-12-27 NOTE — Progress Notes (Signed)
 HPI: [name] is a 78 y.o. female with a PMHx significant for CHF, CAD, PAD, severe COPD, insomnia, hypothyroidism, bladder cancer, breast cancer, anxiety, depression, prediabetes, and HLD who is here today for her routine physical.  Last CPE: 12/25/2022  Exercise: Patient states she does not exercise regularly but is active.  Diet: She is cooking mainly at home and eating vegetables daily.  Sleep: She had tried sleeping with a CPAP for 3 weeks but stopped because she thought it was worsening her congestion. She sleeps about 6 hours per night.  Alcohol Use: She has a few beers daily.  Smoking: She quit smoking in 2017.  Vision: She has not been to an eye doctor in years.  Dental: UTD on routine dental care.   Immunization History  Administered Date(s) Administered   Fluad Quad(high Dose 65+) 08/26/2019, 07/19/2021, 07/30/2022   Fluad Trivalent(High Dose 65+) 06/28/2023   Influenza, High Dose Seasonal PF 10/03/2017, 06/20/2018   Influenza,inj,Quad PF,6+ Mos 08/10/2014, 08/12/2015, 07/03/2016   PFIZER(Purple Top)SARS-COV-2 Vaccination 01/06/2020   Pneumococcal Conjugate-13 08/10/2014   Pneumococcal Polysaccharide-23 08/12/2015   Td 10/16/2007   Zoster, Live 11/09/2010   Health Maintenance  Topic Date Due   Zoster Vaccines- Shingrix (1 of 2) 11/14/1964   Medicare Annual Wellness (AWV)  02/25/2024   COVID-19 Vaccine (2 - Pfizer risk series) 01/12/2024 (Originally 01/27/2020)   Pneumonia Vaccine 11+ Years old  Completed   INFLUENZA VACCINE  Completed   DEXA SCAN  Completed   Hepatitis C Screening  Completed   HPV VACCINES  Aged Out   Lung Cancer Screening  Discontinued   DTaP/Tdap/Td  Discontinued   Colonoscopy  Discontinued   Chronic medical problems:   CAD/PAD:  Currently on aspirin 81 mg daily and Plavix 75 mg daily.  She has appointments with vascular and cardiology next week.   Hyperlipidemia: Currently on atorvastatin 80 mg daily and Zetia 10 mg daily.  Side  effects from medication: none Lab Results  Component Value Date   CHOL 145 12/26/2021   HDL 93 12/26/2021   LDLCALC 40 12/26/2021   LDLDIRECT 88.0 08/18/2013   TRIG 57 12/26/2021   CHOLHDL 1.6 12/26/2021   Hypothyroidism:  Currently on levothyroxine 56 mcg on Tuesday and Thursday and 112 mcg on other days of the week.  Lab Results  Component Value Date   TSH 0.538 05/02/2023   Anxiety:Currently on Lorazepam 2 mg daily and Paroxetine 20 mg daily.   Insomnia: Currently on trazodone 100 mg at bedtime.   GERD:  Currently on pantoprazole 20 mg twice daily. She says it works very well but has ran out of med, so has been taking OTC antiacids. Hx of diarrhea,nausea,and vomiting. She needs a referral for a new [place] gastroenterologist.   HTN on non pharmacologic treatment. HypoK+: She  is on KLOR 20 meq daily.  B12 deficiency: She is on B12 1000 mcg IM q 4-6 weeks. She is due for a B12 shot today.  Lab Results  Component Value Date   VITAMINB12 189 (L) 06/28/2023   She also follows with pulmonology for COPD and OSA. Hx of bladder cancer, follows with urologist annually. Hearing loss, wears hearing aids. Follows with audiologist annually.   Review of Systems  Constitutional:  Positive for fatigue. Negative for activity change, appetite change, chills and fever.  HENT:  Positive for congestion and rhinorrhea. Negative for mouth sores and sore throat.   Eyes:  Negative for redness and visual disturbance.  Respiratory:  Positive  for cough (stable). Negative for shortness of breath and wheezing.   Gastrointestinal:  Positive for diarrhea and nausea. Negative for abdominal pain.  Endocrine: Negative for cold intolerance and heat intolerance.  Genitourinary:  Negative for decreased urine volume, dysuria and hematuria.  Musculoskeletal:  Positive for arthralgias, back pain and gait problem.  Skin:  Negative for rash.  Neurological:  Negative for syncope, weakness and headaches.   Psychiatric/Behavioral:  Positive for sleep disturbance. Negative for confusion and hallucinations.    Current Outpatient Medications on File Prior to Visit  Medication Sig Dispense Refill   albuterol (VENTOLIN HFA) 108 (90 Base) MCG/ACT inhaler Inhale 2 puffs into the lungs every 6 (six) hours as needed for wheezing or shortness of breath. TAKE 2 PUFFS BY MOUTH EVERY 6 HOURS AS NEEDED FOR WHEEZE OR SHORTNESS OF BREATH Strength: 108 (90 Base) MCG/ACT 8.5 each 12   ammonium lactate (AMLACTIN DAILY) 12 % lotion Apply 1 Application topically daily.     aspirin EC 81 MG tablet Take 1 tablet (81 mg total) by mouth daily. 30 tablet    atorvastatin (LIPITOR) 80 MG tablet Take 1 tablet (80 mg total) by mouth daily. 90 tablet 3   budesonide (PULMICORT) 0.5 MG/2ML nebulizer solution Take 2 mLs (0.5 mg total) by nebulization 2 (two) times daily. 20 mL 12   cilostazol (PLETAL) 100 MG tablet Take 1 tablet (100 mg total) by mouth 2 (two) times daily. 30 tablet 0   clobetasol (TEMOVATE) 0.05 % external solution Apply 1 Application topically 2 (two) times daily.     clopidogrel (PLAVIX) 75 MG tablet TAKE ONE TABLET BY MOUTH DAILY AT 9AM 90 tablet 5   DUPIXENT 300 MG/2ML SOPN Inject 300 mg into the skin every 14 (fourteen) days.     ezetimibe (ZETIA) 10 MG tablet Take 1 tablet (10 mg total) by mouth daily. 90 tablet 1   FEROSUL 325 (65 Fe) MG tablet TAKE ONE TABLET BY MOUTH DAILY AT 9AM WITH BREAKFAST 90 tablet 2   fluticasone (FLONASE) 50 MCG/ACT nasal spray SPRAY 1 SPRAY IN EACH NOSTRIL TWICE DAILY 48 g 2   furosemide (LASIX) 40 MG tablet TAKE ONE TABLET BY MOUTH TWICE DAILY 180 tablet 2   hydrOXYzine (ATARAX) 25 MG tablet Take 25 mg by mouth at bedtime.     ipratropium-albuterol (DUONEB) 0.5-2.5 (3) MG/3ML SOLN Inhale 3 mLs into the lungs as directed. TID for 1 week and then use as needed Q6 hours. (Patient taking differently: Inhale 3 mLs into the lungs every 6 (six) hours as needed (wheezing/SOB).) 360 mL 4    levothyroxine (SYNTHROID) 112 MCG tablet TAKE ONE-HALF TABLET BY MOUTH ON TUESDAY AND THURSDAY . TAKE 1  TABLET BY MOUTH ON MONDAY,  WEDNESDAY, AND FRIDAY , SATURDAY , AND SUNDAY (Patient taking differently: Take 56-112 mcg by mouth See admin instructions. Take 56 mcg by mouth on Tuesday and Thursday. Take 112 mcg by mouth on Monday, Wednesday, Friday, Saturday, and Sunday.) 86 tablet 2   LORazepam (ATIVAN) 2 MG tablet TAKE ONE TABLET (2 MG TOTAL) BY MOUTH AT BEDTIME 30 tablet 3   montelukast (SINGULAIR) 10 MG tablet TAKE 1 TABLET BY MOUTH EVERYDAY AT BEDTIME (Patient taking differently: Take 10 mg by mouth at bedtime.) 90 tablet 1   PARoxetine (PAXIL) 20 MG tablet TAKE ONE TABLET BY MOUTH DAILY AT 9AM (Patient taking differently: Take 20 mg by mouth daily.) 90 tablet 2   potassium chloride SA (KLOR-CON M) 20 MEQ tablet TAKE 1 TABLET BY  MOUTH DAILY 100 tablet 2   tamsulosin (FLOMAX) 0.4 MG CAPS capsule TAKE 1 CAPSULE BY MOUTH DAILY 100 capsule 2   traZODone (DESYREL) 100 MG tablet TAKE ONE TABLET BY MOUTH DAILY AT 9PM AT BEDTIME 90 tablet 2   TRELEGY ELLIPTA 200-62.5-25 MCG/ACT AEPB INHALE 1 PUFF BY MOUTH ONCE DAILY (RINSE MOUTH AFTER EACH USE) (BULK) 60 each 11   urea (CARMOL) 40 % CREA Apply 1 Application topically daily as needed (eczema).     No current facility-administered medications on file prior to visit.    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)    05/ 2018  chronic non-occlusive DVT RLE   Chronic kidney disease    COPD, frequent exacerbations (HCC)    03-06-2018 per pt last exacerbation 12/ 2018   Coronary artery disease    cardiologist-  dr Shirlee Latch-- 03-11-2018 er cath , 80% stenosis in the ostial second diagonal   Dyspnea on minimal exertion    Emphysema/COPD (HCC)    CAT score- 17   Family history of breast  cancer    Family history of colon cancer    Family history of melanoma    Family history of ovarian cancer    Family history of pancreatic cancer    Fibromyalgia 1995   GERD (gastroesophageal reflux disease)    Hiatal hernia    History of breast cancer    History of diverticulitis of colon    2002  s/p  sigmoid colectomy   History of multiple pulmonary nodules    hx RUL nodules x2  s/p right VATS w/ wedge resection 09-11-2005 and 06-14-2011  both  necrotizing granulomatous inflammation w/ cystic area of necrosis & focal calcification   History of right breast cancer oncologist-  dr [name]-- no recurrence   dx 2010--  IDC, Stage IA , ER/PR+,  (pT1c pN0) 11-09-2008 right lumpectomy;  12-18-2008 right simple mastectomy for DCIS margins;  completed chemotherapy 2010 (no radiation) and completed antiestrogen therapy   Hx of colonic polyps    Dr. 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   Nocturia    OA (osteoarthritis)    thumbs   On supplemental oxygen therapy    03-06-2018 per pt uses only at night,  checks O2 sats at home,  stated am sat 89% after moving around average 93-94% with RA   Osteoporosis    Peripheral arterial occlusive disease (HCC) vascular-- dr chen/ dr Randie Heinz   proximal right SFA severe focal stenosis with collaterals from the PFA/  04/ 2012 occluded celiac and SMA arteries with distal reconstitution w/ patent IMA   Peripheral vascular disease (HCC)    chronic DVT RLE,  mesenteric vascular disease   Pneumonia    Right lower lobe pulmonary nodule    Chest CT 08-29-2017   Sleep apnea    Stage 3 severe COPD by GOLD classification (HCC) hx frequent exacerbations--   pulmologist-  dr Inocente Salles--  per lov note , dated 12-20-2017, oxyogen 2L is prescribed for use with exertion (but pt only uses mostly at night), O2 sats on RA run the 80s , this day sat 86% RA and with  2L O2  sat 92%   Varicose veins of leg with swelling    varicose vein surgery - Dr. Guss Bunde   Wears glasses    Wears hearing aid in both ears     Past Surgical History:  Procedure Laterality Date   ABDOMINAL AORTOGRAM N/A 07/11/2020   Procedure: ABDOMINAL AORTOGRAM;  Surgeon: Maeola Harman, MD;  Location: [facility] INVASIVE CV LAB;  Service: Cardiovascular;  Laterality: N/A;   AORTIC ARCH ANGIOGRAPHY N/A 07/11/2020   Procedure: AORTIC ARCH ANGIOGRAPHY;  Surgeon: Maeola Harman, MD;  Location: [facility] INVASIVE CV LAB;  Service: Cardiovascular;  Laterality: N/A;   BREAST EXCISIONAL BIOPSY Left 10/15/2008   BREAST LUMPECTOMY W/ NEEDLE LOCALIZATION Right 11-09-2008    dr cornett   [facility]   CARDIAC CATHETERIZATION  03-12-2011   dr Shirlee Latch   70-80% ostial stenosis in small second diagonal (appears to small for intervention),  dLAD 40-50%,  minimal luminal irregulartieis involoving LCFx and RCA,  LVEF 55%   CATARACT EXTRACTION W/ INTRAOCULAR LENS  IMPLANT, BILATERAL  2011   CYSTOSCOPY N/A 03/11/2018   Procedure: CYSTOSCOPY WITH INSTILLATION OF POST OPERATIVE EPIRUBICIN;  Surgeon: Jerilee Field, MD;  Location: [facility] ORS;  Service: Urology;  Laterality: N/A;   LEFT HEART CATH AND CORONARY ANGIOGRAPHY N/A 05/25/2020   Procedure: LEFT HEART CATH AND CORONARY ANGIOGRAPHY;  Surgeon: Lennette Bihari, MD;  Location: [facility] INVASIVE CV LAB;  Service: Cardiovascular;  Laterality: N/A;   MASTECTOMY Right    PARTIAL COLECTOMY  2002   sigmoid and appectomy (diverticulitis)   PARTIAL MASTECTOMY WITH NEEDLE LOCALIZATION Left 02/23/2013   Procedure:  LEFT PARTIAL MASTECTOMY WITH NEEDLE LOCALIZATION;  Surgeon: [name], MD;  Location: [facility];  Service: General;  Laterality: Left;   PERIPHERAL VASCULAR INTERVENTION  07/11/2020   Procedure: PERIPHERAL VASCULAR INTERVENTION;  Surgeon: Maeola Harman, MD;  Location: [facility] INVASIVE CV LAB;  Service: Cardiovascular;;    RECONSTRUCTION BREAST W/ LATISSIMUS DORSI FLAP Right 05-30-2009   dr Odis Luster  [facility]   REDUCTION MAMMAPLASTY Left    SIMPLE MASTECTOMY Right 12-28-2008    dr Luisa Hart  [facility]   TRANSFORAMINAL LUMBAR INTERBODY FUSION W/ MIS 1 LEVEL Left 10/22/2022   Procedure: LUMBAR THREE-FOUR MINIMALLY INVASIVE TRANSFORAMINAL LUMBAR INTERBODY FUSION;  Surgeon: Jadene Pierini, MD;  Location: [facility] OR;  Service: Neurosurgery;  Laterality: Left;  3C/RM 21 to follow   TRANSTHORACIC ECHOCARDIOGRAM  07-09-2016  dr Shirlee Latch   ef 55-60%, grade 1 diastoic dysfunction/  mild TR   TRANSURETHRAL RESECTION OF BLADDER TUMOR N/A 03/11/2018   Procedure: TRANSURETHRAL RESECTION OF BLADDER TUMOR (TURBT) 2-5cm;  Surgeon: Jerilee Field, MD;  Location: [facility] ORS;  Service: Urology;  Laterality: N/A;   UPPER EXTREMITY ANGIOGRAPHY Left 07/11/2020   Procedure: Upper Extremity Angiography;  Surgeon: Maeola Harman, MD;  Location: [facility] INVASIVE CV LAB;  Service: Cardiovascular;  Laterality: Left;   VAGINAL HYSTERECTOMY  1973   VIDEO ASSISTED THORACOSCOPY (VATS)/WEDGE RESECTION Right 09-11-2005  &  06-14-2011   dr Sloan Leiter  [facility]   both RUL    Allergies  Allergen Reactions   Sinequan [Doxepin] Other (See Comments)    Kept her awake    Sulfa Antibiotics Swelling   Sulfur     Other reaction(s): Unknown    Family History  Problem Relation Age of Onset   Colon cancer Mother        colon   COPD Mother        brown lung   Hypertension Mother  Diabetes Mother    Emphysema Mother    Coronary artery disease Father    Heart attack Father    Sudden death Father    Heart disease Father    Cancer Sister        throat   Hypertension Sister    Coronary artery disease Brother    Heart attack Brother        early 40s   Throat cancer Sister    Ovarian cancer Sister        fallopian tube cancer in her 61s   Melanoma Sister    Hypertension Sister    Pancreatic cancer Sister    Melanoma Niece        dx in her 7s   Breast  cancer Niece 8    Social History   Socioeconomic History   Marital status: Married    Spouse name: Not on file   Number of children: 3   Years of education: 12   Highest education level: Not on file  Occupational History   Occupation: Magazine features editor: RETIRED    Comment: retired  Tobacco Use   Smoking status: Former    Current packs/day: 0.00    Average packs/day: 0.3 packs/day for 50.0 years (12.5 ttl pk-yrs)    Types: Cigarettes    Start date: 05/18/1966    Quit date: 05/18/2016    Years since quitting: 7.6   Smokeless tobacco: Never  Vaping Use   Vaping status: Never Used  Substance and Sexual Activity   Alcohol use: Yes    Alcohol/week: 14.0 standard drinks of alcohol    Types: 14 Cans of beer per week    Comment: every night   Drug use: No   Sexual activity: Not on file  Other Topics Concern   Not on file  Social History Narrative   ** Merged History Encounter **       HSG, beauty school. Married '69 -7 yrs/widowed; married '79 - 76yr/divorced; married '87.  2 sons - '70, '74, 1 srtep-son; 1 grandchild. 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 SO - good health.  NO history of abuse.  ACP - full code and all heroic measures.    Social Drivers of Corporate investment banker Strain: Low Risk  (02/25/2023)   Overall Financial Resource Strain (CARDIA)    Difficulty of Paying Living Expenses: Not hard at all  Food Insecurity: No Food Insecurity (12/13/2023)   Hunger Vital Sign    Worried About Running Out of Food in the Last Year: Never true    Ran Out of Food in the Last Year: Never true  Transportation Needs: No Transportation Needs (12/13/2023)   PRAPARE - Administrator, Civil Service (Medical): No    Lack of Transportation (Non-Medical): No  Physical Activity: Inactive (02/25/2023)   Exercise Vital Sign    Days of Exercise per Week: 0 days    Minutes of Exercise per Session: 0 min  Stress: No Stress Concern Present (02/25/2023)   [employer] of  Occupational Health - Occupational Stress Questionnaire    Feeling of Stress : Not at all  Social Connections: Moderately Isolated (12/09/2023)   Social Connection and Isolation Panel [NHANES]    Frequency of Communication with Friends and Family: More than three times a week    Frequency of Social Gatherings with Friends and Family: More than three times a week    Attends Religious Services: Never    Production manager of [employer]  or Organizations: No    Attends Banker Meetings: Never    Marital Status: Married   Vitals:   12/27/23 1343  BP: 126/70  Pulse: 71  Resp: 16  Temp: (!) 97.5 F (36.4 C)  SpO2: 95%   Body mass index is 21.11 kg/m.  Wt Readings from Last 3 Encounters:  12/27/23 130 lb 12.8 oz (59.3 kg)  12/08/23 135 lb (61.2 kg)  12/03/23 128 lb 9.6 oz (58.3 kg)   Physical Exam Vitals and nursing note reviewed.  Constitutional:      General: She is not in acute distress.    Appearance: She is well-developed.  HENT:     Head: Normocephalic and atraumatic.     Right Ear: External ear normal.     Left Ear: External ear normal.     Ears:     Comments: Hearing aids on.    Mouth/Throat:     Mouth: Mucous membranes are moist.     Pharynx: Oropharynx is clear. Uvula midline.  Eyes:     Conjunctiva/sclera: Conjunctivae normal.     Pupils: Pupils are equal, round, and reactive to light.  Neck:     Thyroid: No thyroid mass.  Cardiovascular:     Rate and Rhythm: Normal rate and regular rhythm.     Heart sounds: No murmur heard.    Comments: DP pulses palpable. Pulmonary:     Effort: Pulmonary effort is normal. No respiratory distress.     Breath sounds: Normal breath sounds.  Abdominal:     Palpations: Abdomen is soft. There is no mass.     Tenderness: There is no abdominal tenderness.     Hernia: A hernia is present.    Musculoskeletal:     Right lower leg: No edema.     Left lower leg: No edema.  Lymphadenopathy:     Cervical: No cervical  adenopathy.  Skin:    General: Skin is warm.     Findings: No erythema or rash.  Neurological:     General: No focal deficit present.     Mental Status: She is alert and oriented to person, place, and time.     Cranial Nerves: No cranial nerve deficit.     Deep Tendon Reflexes:     Reflex Scores:      Bicep reflexes are 2+ on the right side and 2+ on the left side.      Patellar reflexes are 2+ on the right side and 2+ on the left side.    Comments: Antalgic gait , not assisted.  Psychiatric:        Mood and Affect: Mood and affect normal.   ASSESSMENT AND PLAN:  [name] was here today for her annual physical examination.  Orders Placed This Encounter  Procedures   Vitamin B12   TSH   T4, free   Hepatic function panel   Ambulatory referral to Gastroenterology    Routine general medical examination at a health care facility Assessment & Plan: We discussed the importance of staying active, physically and mentally, as well as the benefits of a healthy/balance diet. Low impact exercise that involve stretching and strengthing are ideal. Preventive guidelines reviewed. Vaccines up to date. Next CPE in a year.   Gastroesophageal reflux disease, unspecified whether esophagitis present Assessment & Plan: Stable. Continue Protonix 20 mg bid and GERD precautions. GI referral placed as requested, her former GI is not linger in her network.  Orders: -  Pantoprazole Sodium; Take 1 tablet (20 mg total) by mouth 2 (two) times daily before a meal.  Dispense: 180 tablet; Refill: 3 -     Ambulatory referral to Gastroenterology  Insomnia, unspecified type Assessment & Plan: Stable. On Trazodone 100 mg at bedtime and Lorazepam 2 mg at bedtime.   Other specified hypothyroidism Assessment & Plan: Last TSH 0.53 in 04/2023. Continue Levothyroxine 112 mcg daily x 5 d, 1/2 tab Thursdays and Tuesdays.  Further recommendations according to TSH result.  Orders: -     TSH;  Future -     T4, free; Future  B12 deficiency Assessment & Plan: Today after verbal consent she received B12 1000 mcg IM. Continue B12 1000 mcg IM q 4-5 weeks.  Further recommendations according to B12 result.  Orders: -     Vitamin B12; Future -     Cyanocobalamin  Malignant neoplasm of urinary bladder, unspecified site [facility]) Assessment & Plan: Follows with urologist annually.   Chronic diarrhea Assessment & Plan: With associated nausea and vomiting. She has hx cholelithiasis, has been evaluated by surgeon. Her gastroenterologist's practice has moved, so she needs to establish with a new provider.  Orders: -     Ambulatory referral to Gastroenterology  Hyperlipidemia, unspecified hyperlipidemia type Assessment & Plan: She is not fasting today. Last LDL 40 in 12/2021. Continue Atorvastatin 80 mg and Zetia 10 mg daily as well as low fat diet. FLP can be added to next blood work.   Orders: -     Hepatic function panel; Future  Anxiety disorder, unspecified type Assessment & Plan: Stable. Continue Paroxetine 20 mg daily and Lorazepam 2 mg daily. She is on Trazodone for sleep, she she tolerated well, understands the risk for interaction.   Return in 6 months (on 06/28/2024) for chronic problems.  I, Rolla Etienne Wierda, acting as a scribe for Valgene Deloatch [place], MD., have documented all relevant documentation on the behalf of Dallin Mccorkel [place], MD, as directed by  Earline Stiner [place], MD while in the presence of Candance Bohlman [place], MD.   I, Bracken Moffa [place], MD, have reviewed all documentation for this visit. The documentation on 12/27/23 for the exam, diagnosis, procedures, and orders are all accurate and complete.  Cyncere Ruhe G. [place], MD  [facility]. [place] office.

## 2023-12-27 NOTE — Assessment & Plan Note (Signed)
 She is not fasting today. Last LDL 40 in 12/2021. Continue Atorvastatin 80 mg and Zetia 10 mg daily as well as low fat diet. FLP can be added to next blood work.

## 2023-12-27 NOTE — Assessment & Plan Note (Signed)
 Stable. Continue Protonix 20 mg bid and GERD precautions. GI referral placed as requested, her former GI is not linger in her network.

## 2023-12-30 ENCOUNTER — Ambulatory Visit: Payer: 59

## 2023-12-31 DIAGNOSIS — Z1231 Encounter for screening mammogram for malignant neoplasm of breast: Secondary | ICD-10-CM | POA: Diagnosis not present

## 2023-12-31 LAB — HM MAMMOGRAPHY

## 2024-01-01 ENCOUNTER — Other Ambulatory Visit: Payer: Self-pay | Admitting: Family Medicine

## 2024-01-01 ENCOUNTER — Ambulatory Visit (HOSPITAL_COMMUNITY)
Admission: RE | Admit: 2024-01-01 | Discharge: 2024-01-01 | Disposition: A | Discharge status: Home / Self Care | Payer: 59 | Source: Ambulatory Visit | Attending: Vascular Surgery | Admitting: Vascular Surgery

## 2024-01-01 ENCOUNTER — Ambulatory Visit (INDEPENDENT_AMBULATORY_CARE_PROVIDER_SITE_OTHER): Payer: 59 | Admitting: Physician Assistant

## 2024-01-01 VITALS — BP 166/78 | HR 97 | Temp 97.8°F | Resp 18 | Ht 66.0 in | Wt 130.6 lb

## 2024-01-01 DIAGNOSIS — G458 Other transient cerebral ischemic attacks and related syndromes: Secondary | ICD-10-CM | POA: Diagnosis not present

## 2024-01-01 DIAGNOSIS — F419 Anxiety disorder, unspecified: Secondary | ICD-10-CM

## 2024-01-01 DIAGNOSIS — K551 Chronic vascular disorders of intestine: Secondary | ICD-10-CM | POA: Diagnosis not present

## 2024-01-01 DIAGNOSIS — G4700 Insomnia, unspecified: Secondary | ICD-10-CM

## 2024-01-01 NOTE — Progress Notes (Unsigned)
 [facility] [place] HISTORY AND PHYSICAL   History of Present Illness:  Patient is a 78 y.o. 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  She denies pain, loss of sensation or motor in the left UE.    She also has known mesenteric artery occlusive disease.  She had lost weight and was down to 114 lbs.  She since has regained her weight and is now 130 lbs.  She denies post prandial pain or N/V after a meal.    She was recently hospitalized at [facility] on 12/08/23 due to SOB with history of sever COPD acute on chronic hypoxic respiratory failure secondary to acute COPD exacerbation, was treated as usual with steroids, bronchodilators, cough suppressants and antibiotics.    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)    05/ 2018  chronic non-occlusive DVT RLE   Chronic kidney disease    COPD, frequent exacerbations (HCC)    03-06-2018 per pt last exacerbation 12/ 2018   Coronary artery disease    cardiologist-  dr Shirlee Latch-- 03-11-2018 er cath , 80% stenosis in the ostial second diagonal   Dyspnea on minimal exertion    Emphysema/COPD (HCC)    CAT score- 17   Family history of breast cancer    Family history of colon cancer    Family history of melanoma    Family history of ovarian cancer    Family history of pancreatic cancer    Fibromyalgia 1995   GERD (gastroesophageal reflux disease)    Hiatal hernia    History of breast cancer    History of diverticulitis of colon    2002  s/p  sigmoid colectomy   History of multiple pulmonary nodules    hx RUL nodules x2  s/p right VATS w/ wedge resection 09-11-2005 and 06-14-2011  both  necrotizing granulomatous inflammation w/ cystic area of  necrosis & focal calcification   History of right breast cancer oncologist-  dr [name]-- no recurrence   dx 2010--  IDC, Stage IA , ER/PR+,  (pT1c pN0) 11-09-2008 right lumpectomy;  12-18-2008 right simple mastectomy for DCIS margins;  completed chemotherapy 2010 (no radiation) and completed antiestrogen therapy   Hx of colonic polyps    Dr. 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   Nocturia    OA (osteoarthritis)    thumbs   On supplemental oxygen therapy    03-06-2018 per pt uses only at night,  checks O2 sats at home,  stated am sat 89% after moving around average 93-94% with RA   Osteoporosis    Peripheral arterial occlusive disease (HCC) vascular-- dr chen/ dr Randie Heinz   proximal right SFA severe focal stenosis with collaterals from the PFA/  04/ 2012 occluded celiac and SMA arteries with distal reconstitution w/ patent IMA   Peripheral vascular disease (HCC)    chronic DVT RLE,  mesenteric vascular disease   Pneumonia    Right lower lobe pulmonary nodule    Chest CT 08-29-2017   Sleep apnea  Stage 3 severe COPD by GOLD classification (HCC) hx frequent exacerbations--   pulmologist-  dr Inocente Salles--  per lov note , dated 12-20-2017, oxyogen 2L is prescribed for use with exertion (but pt only uses mostly at night), O2 sats on RA run the 80s , this day sat 86% RA and with 2L O2 sat 92%   Varicose veins of leg with swelling    varicose vein surgery - Dr. Guss Bunde   Wears glasses    Wears hearing aid in both ears     Past Surgical History:  Procedure Laterality Date   ABDOMINAL AORTOGRAM N/A 07/11/2020   Procedure: ABDOMINAL AORTOGRAM;  Surgeon: Maeola Harman, MD;  Location: [facility] INVASIVE CV LAB;  Service: Cardiovascular;  Laterality: N/A;   AORTIC ARCH ANGIOGRAPHY N/A 07/11/2020   Procedure: AORTIC ARCH ANGIOGRAPHY;  Surgeon: Maeola Harman, MD;   Location: [facility] INVASIVE CV LAB;  Service: Cardiovascular;  Laterality: N/A;   BREAST EXCISIONAL BIOPSY Left 10/15/2008   BREAST LUMPECTOMY W/ NEEDLE LOCALIZATION Right 11-09-2008    dr cornett   [facility]   CARDIAC CATHETERIZATION  03-12-2011   dr Shirlee Latch   70-80% ostial stenosis in small second diagonal (appears to small for intervention),  dLAD 40-50%,  minimal luminal irregulartieis involoving LCFx and RCA,  LVEF 55%   CATARACT EXTRACTION W/ INTRAOCULAR LENS  IMPLANT, BILATERAL  2011   CYSTOSCOPY N/A 03/11/2018   Procedure: CYSTOSCOPY WITH INSTILLATION OF POST OPERATIVE EPIRUBICIN;  Surgeon: Jerilee Field, MD;  Location: [facility] ORS;  Service: Urology;  Laterality: N/A;   LEFT HEART CATH AND CORONARY ANGIOGRAPHY N/A 05/25/2020   Procedure: LEFT HEART CATH AND CORONARY ANGIOGRAPHY;  Surgeon: Lennette Bihari, MD;  Location: [facility] INVASIVE CV LAB;  Service: Cardiovascular;  Laterality: N/A;   MASTECTOMY Right    PARTIAL COLECTOMY  2002   sigmoid and appectomy (diverticulitis)   PARTIAL MASTECTOMY WITH NEEDLE LOCALIZATION Left 02/23/2013   Procedure:  LEFT PARTIAL MASTECTOMY WITH NEEDLE LOCALIZATION;  Surgeon: [name], MD;  Location:  SURGERY CENTER;  Service: General;  Laterality: Left;   PERIPHERAL VASCULAR INTERVENTION  07/11/2020   Procedure: PERIPHERAL VASCULAR INTERVENTION;  Surgeon: Maeola Harman, MD;  Location: [facility] INVASIVE CV LAB;  Service: Cardiovascular;;   RECONSTRUCTION BREAST W/ LATISSIMUS DORSI FLAP Right 05-30-2009   dr Odis Luster  [facility]   REDUCTION MAMMAPLASTY Left    SIMPLE MASTECTOMY Right 12-28-2008    dr Luisa Hart  [facility]   TRANSFORAMINAL LUMBAR INTERBODY FUSION W/ MIS 1 LEVEL Left 10/22/2022   Procedure: LUMBAR THREE-FOUR MINIMALLY INVASIVE TRANSFORAMINAL LUMBAR INTERBODY FUSION;  Surgeon: Jadene Pierini, MD;  Location: [facility] OR;  Service: Neurosurgery;  Laterality: Left;  3C/RM 21 to follow   TRANSTHORACIC ECHOCARDIOGRAM  07-09-2016  dr Shirlee Latch   ef 55-60%, grade 1  diastoic dysfunction/  mild TR   TRANSURETHRAL RESECTION OF BLADDER TUMOR N/A 03/11/2018   Procedure: TRANSURETHRAL RESECTION OF BLADDER TUMOR (TURBT) 2-5cm;  Surgeon: Jerilee Field, MD;  Location: [facility] ORS;  Service: Urology;  Laterality: N/A;   UPPER EXTREMITY ANGIOGRAPHY Left 07/11/2020   Procedure: Upper Extremity Angiography;  Surgeon: Maeola Harman, MD;  Location: [facility] INVASIVE CV LAB;  Service: Cardiovascular;  Laterality: Left;   VAGINAL HYSTERECTOMY  1973   VIDEO ASSISTED THORACOSCOPY (VATS)/WEDGE RESECTION Right 09-11-2005  &  06-14-2011   dr hendrickso  [facility]   both RUL    ROS:   General:  No weight loss, Fever, chills  HEENT: No recent headaches, no nasal bleeding, no visual  changes, no sore throat  Neurologic: No dizziness, blackouts, seizures. No recent symptoms of stroke or mini- stroke. No recent episodes of slurred speech, or temporary blindness.  Cardiac: No recent episodes of chest pain/pressure, no shortness of breath at rest.  No shortness of breath with exertion.  Denies history of atrial fibrillation or irregular heartbeat  Vascular: No history of rest pain in feet.  No history of claudication.  No history of non-healing ulcer, No history of DVT   Pulmonary: No home oxygen, no productive cough, no hemoptysis,  No asthma or wheezing  Musculoskeletal:  [ ]  Arthritis, [ ]  Low back pain,  [ ]  Joint pain  Hematologic:No history of hypercoagulable state.  No history of easy bleeding.  No history of anemia  Gastrointestinal: No hematochezia or melena,  No gastroesophageal reflux, no trouble swallowing  Urinary: [ ]  chronic Kidney disease, [ ]  on HD - [ ]  MWF or [ ]  TTHS, [ ]  Burning with urination, [ ]  Frequent urination, [ ]  Difficulty urinating;   Skin: No rashes  Psychological: No history of anxiety,  No history of depression  Social History Social History   Tobacco Use   Smoking status: Former    Current packs/day: 0.00    Average packs/day: 0.3  packs/day for 50.0 years (12.5 ttl pk-yrs)    Types: Cigarettes    Start date: 05/18/1966    Quit date: 05/18/2016    Years since quitting: 7.6   Smokeless tobacco: Never  Vaping Use   Vaping status: Never Used  Substance Use Topics   Alcohol use: Yes    Alcohol/week: 14.0 standard drinks of alcohol    Types: 14 Cans of beer per week    Comment: every night   Drug use: No    Family History Family History  Problem Relation Age of Onset   Colon cancer Mother        colon   COPD Mother        brown lung   Hypertension Mother    Diabetes Mother    Emphysema Mother    Coronary artery disease Father    Heart attack Father    Sudden death Father    Heart disease Father    Cancer Sister        throat   Hypertension Sister    Coronary artery disease Brother    Heart attack Brother        early 58s   Throat cancer Sister    Ovarian cancer Sister        fallopian tube cancer in her 68s   Melanoma Sister    Hypertension Sister    Pancreatic cancer Sister    Melanoma Niece        dx in her 39s   Breast cancer Niece 51    Allergies  Allergies  Allergen Reactions   Sinequan [Doxepin] Other (See Comments)    Kept her awake    Sulfa Antibiotics Swelling   Sulfur     Other reaction(s): Unknown     Current Outpatient Medications  Medication Sig Dispense Refill   albuterol (VENTOLIN HFA) 108 (90 Base) MCG/ACT inhaler Inhale 2 puffs into the lungs every 6 (six) hours as needed for wheezing or shortness of breath. TAKE 2 PUFFS BY MOUTH EVERY 6 HOURS AS NEEDED FOR WHEEZE OR SHORTNESS OF BREATH Strength: 108 (90 Base) MCG/ACT 8.5 each 12   ammonium lactate (AMLACTIN DAILY) 12 % lotion Apply 1 Application topically daily.  aspirin EC 81 MG tablet Take 1 tablet (81 mg total) by mouth daily. 30 tablet    atorvastatin (LIPITOR) 80 MG tablet Take 1 tablet (80 mg total) by mouth daily. 90 tablet 3   budesonide (PULMICORT) 0.5 MG/2ML nebulizer solution Take 2 mLs (0.5 mg total) by  nebulization 2 (two) times daily. 20 mL 12   cilostazol (PLETAL) 100 MG tablet Take 1 tablet (100 mg total) by mouth 2 (two) times daily. 30 tablet 0   clobetasol (TEMOVATE) 0.05 % external solution Apply 1 Application topically 2 (two) times daily.     clopidogrel (PLAVIX) 75 MG tablet TAKE ONE TABLET BY MOUTH DAILY AT 9AM 90 tablet 5   DUPIXENT 300 MG/2ML SOPN Inject 300 mg into the skin every 14 (fourteen) days.     ezetimibe (ZETIA) 10 MG tablet Take 1 tablet (10 mg total) by mouth daily. 90 tablet 1   FEROSUL 325 (65 Fe) MG tablet TAKE ONE TABLET BY MOUTH DAILY AT 9AM WITH BREAKFAST 90 tablet 2   fluticasone (FLONASE) 50 MCG/ACT nasal spray SPRAY 1 SPRAY IN EACH NOSTRIL TWICE DAILY 48 g 2   furosemide (LASIX) 40 MG tablet TAKE ONE TABLET BY MOUTH TWICE DAILY 180 tablet 2   hydrOXYzine (ATARAX) 25 MG tablet Take 25 mg by mouth at bedtime.     ipratropium-albuterol (DUONEB) 0.5-2.5 (3) MG/3ML SOLN Inhale 3 mLs into the lungs as directed. TID for 1 week and then use as needed Q6 hours. (Patient taking differently: Inhale 3 mLs into the lungs every 6 (six) hours as needed (wheezing/SOB).) 360 mL 4   levothyroxine (SYNTHROID) 112 MCG tablet TAKE ONE-HALF TABLET BY MOUTH ON TUESDAY AND THURSDAY . TAKE 1  TABLET BY MOUTH ON MONDAY,  WEDNESDAY, AND FRIDAY , SATURDAY , AND SUNDAY (Patient taking differently: Take 56-112 mcg by mouth See admin instructions. Take 56 mcg by mouth on Tuesday and Thursday. Take 112 mcg by mouth on Monday, Wednesday, Friday, Saturday, and Sunday.) 86 tablet 2   LORazepam (ATIVAN) 2 MG tablet TAKE ONE TABLET (2 MG TOTAL) BY MOUTH AT BEDTIME 30 tablet 3   montelukast (SINGULAIR) 10 MG tablet TAKE 1 TABLET BY MOUTH EVERYDAY AT BEDTIME (Patient taking differently: Take 10 mg by mouth at bedtime.) 90 tablet 1   pantoprazole (PROTONIX) 20 MG tablet Take 1 tablet (20 mg total) by mouth 2 (two) times daily before a meal. 180 tablet 3   PARoxetine (PAXIL) 20 MG tablet TAKE ONE TABLET BY  MOUTH DAILY AT 9AM (Patient taking differently: Take 20 mg by mouth daily.) 90 tablet 2   potassium chloride SA (KLOR-CON M) 20 MEQ tablet TAKE 1 TABLET BY MOUTH DAILY 100 tablet 2   tamsulosin (FLOMAX) 0.4 MG CAPS capsule TAKE 1 CAPSULE BY MOUTH DAILY 100 capsule 2   traZODone (DESYREL) 100 MG tablet TAKE ONE TABLET BY MOUTH DAILY AT 9PM AT BEDTIME 90 tablet 2   TRELEGY ELLIPTA 200-62.5-25 MCG/ACT AEPB INHALE 1 PUFF BY MOUTH ONCE DAILY (RINSE MOUTH AFTER EACH USE) (BULK) 60 each 11   urea (CARMOL) 40 % CREA Apply 1 Application topically daily as needed (eczema).     No current facility-administered medications for this visit.    Physical Examination  Vitals:   01/01/24 1337  BP: (!) 166/78  Pulse: 97  Resp: 18  Temp: 97.8 F (36.6 C)  TempSrc: Temporal  SpO2: 95%  Weight: 130 lb 9.6 oz (59.2 kg)  Height: 5\' 6"  (1.676 m)    Body  mass index is 21.08 kg/m.  General:  Alert and oriented, no acute distress HEENT: Normal Neck: No bruit or JVD Pulmonary: Clear to auscultation bilaterally Cardiac: Regular Rate and Rhythm without murmur Abdomen: Soft, non-tender, non-distended, no mass, no scars Skin: No rash Extremity Pulses:  2+ radial, brachial, femoral, dorsalis pedis, posterior tibial pulses bilaterally Musculoskeletal: No deformity or edema  Neurologic: Upper and lower extremity motor 5/5 and symmetric  DATA: ***   ASSESSMENT: ***   PLAN: ***   Mosetta Pigeon PA-C [facility] [place] Office: [phone]  MD in clinic [place]

## 2024-01-02 ENCOUNTER — Encounter: Payer: Self-pay | Admitting: Family Medicine

## 2024-01-03 NOTE — Telephone Encounter (Signed)
 It seems like she has a refill for Lorazepam available at her pharmacy, can you please verify. Thanks, BJ

## 2024-01-06 ENCOUNTER — Other Ambulatory Visit: Payer: Self-pay | Admitting: Family Medicine

## 2024-01-06 DIAGNOSIS — E039 Hypothyroidism, unspecified: Secondary | ICD-10-CM

## 2024-01-07 DIAGNOSIS — R922 Inconclusive mammogram: Secondary | ICD-10-CM | POA: Diagnosis not present

## 2024-01-07 DIAGNOSIS — R921 Mammographic calcification found on diagnostic imaging of breast: Secondary | ICD-10-CM | POA: Diagnosis not present

## 2024-01-08 ENCOUNTER — Encounter: Payer: Self-pay | Admitting: Family Medicine

## 2024-01-12 ENCOUNTER — Other Ambulatory Visit: Payer: Self-pay | Admitting: Family Medicine

## 2024-01-12 DIAGNOSIS — Z720 Tobacco use: Secondary | ICD-10-CM

## 2024-01-15 ENCOUNTER — Telehealth: Payer: Self-pay | Admitting: Internal Medicine

## 2024-01-15 ENCOUNTER — Encounter: Payer: Self-pay | Admitting: Gastroenterology

## 2024-01-15 DIAGNOSIS — G4733 Obstructive sleep apnea (adult) (pediatric): Secondary | ICD-10-CM

## 2024-01-15 NOTE — Telephone Encounter (Signed)
 Unable to wear CPAP with current nasal mask blamed for inus infection _ order DME [facility]- please change autopap pressure to 4-12 and do face to face refit mask of choice please.

## 2024-01-15 NOTE — Telephone Encounter (Addendum)
 Called patient.  Rescheduled her appointment from  01/24/2024 and rescheduled appointment for 01/16/2024.  Change appointment due to non compliance/not using her CPAP.  Patient states CPAP mask causing a sinus infection to get worse.  Patient in hospital for pneumonia 12/08/2023.  Nasal mask on CPAP made sinus sx worse.  Patient called [employer].  Stated she was told she could not change masks and needed to see Dr. Maple Hudson first.    Placed order per Dr young for DME to [facility] to get a new mask for CPAP and change autopap pressure to 4-12 with 2L oxygen bled in and do face to face refit mask of choice.    Patients 90 day window for compliance ends on 01/26/2024.  Patient will wait for [employer] to call her and go get her new mask and start wearing the CPAP ASAP.  Patient verbalized understanding.

## 2024-01-16 ENCOUNTER — Other Ambulatory Visit: Payer: Self-pay | Admitting: Family Medicine

## 2024-01-16 ENCOUNTER — Ambulatory Visit: Payer: 59 | Admitting: Internal Medicine

## 2024-01-16 DIAGNOSIS — E039 Hypothyroidism, unspecified: Secondary | ICD-10-CM

## 2024-01-16 NOTE — Telephone Encounter (Unsigned)
 Copied from CRM [id number]. Topic: Clinical - Medication Refill >> Jan 16, 2024  5:31 PM [place] J wrote: Most Recent Primary Care Visit:  Provider: [place], [name]  Department: [facility]  Visit Type: PHYSICAL  Date: 12/27/2023  Medication: levothyroxine (SYNTHROID) 112 MCG  Has the patient contacted their pharmacy? Yes (Agent: If no, request that the patient contact the pharmacy for the refill. If patient does not wish to contact the pharmacy document the reason why and proceed with request.) (Agent: If yes, when and what did the pharmacy advise?) Pharmacy is the one calling for refill. Is this the correct pharmacy for this prescription? Yes If no, delete pharmacy and type the correct one.  This is the patient's preferred pharmacy:   [facility] - [place] - [address] [place] [address] Phone: [phone] Fax: [phone]  Has the prescription been filled recently? No  Is the patient out of the medication? Yes  Has the patient been seen for an appointment in the last year OR does the patient have an upcoming appointment? Yes  Can we respond through MyChart? Yes  Agent: Please be advised that Rx refills may take up to 3 business days. We ask that you follow-up with your pharmacy.

## 2024-01-17 MED ORDER — LEVOTHYROXINE SODIUM 112 MCG PO TABS
ORAL_TABLET | ORAL | 11 refills | Status: DC
Start: 2024-01-17 — End: 2024-07-07

## 2024-01-20 DIAGNOSIS — R0602 Shortness of breath: Secondary | ICD-10-CM | POA: Diagnosis not present

## 2024-01-20 DIAGNOSIS — J9601 Acute respiratory failure with hypoxia: Secondary | ICD-10-CM | POA: Diagnosis not present

## 2024-01-20 DIAGNOSIS — J449 Chronic obstructive pulmonary disease, unspecified: Secondary | ICD-10-CM | POA: Diagnosis not present

## 2024-01-21 DIAGNOSIS — J449 Chronic obstructive pulmonary disease, unspecified: Secondary | ICD-10-CM | POA: Diagnosis not present

## 2024-01-23 ENCOUNTER — Other Ambulatory Visit: Payer: Self-pay | Admitting: Physician Assistant

## 2024-01-23 DIAGNOSIS — I739 Peripheral vascular disease, unspecified: Secondary | ICD-10-CM

## 2024-01-23 NOTE — Progress Notes (Signed)
 HPI F Smoker(50 pkyrs) followed for OSA, Insomnia, complicated by  sCHF, CAD, Chronic DVT R lower leg, Mesenteric Ischemia, HTN, PAD, COPD( GOLD III)/ Dr Bertrum Brodie,  Chronic Respiratory Failure with Hypoxia, GERD, Hemoptysis, Hypothyroid,  Hx R Breast Cancer,  NPSG  07/27/20- AHI 26.5/ hr, desaturation to 84%, requiring 1L O2 (Nocturnal Hypoxemia),   Walk Test 12/24/2022 room air-minimum saturation 95%, maximum heart rate 102 HST 12/25/22- AHI 12.1/hr, deat to 80%/ avg 88%, body weight 118 lbs O2 sat at rest on room air 09/16/23- 93% O2 sat POC qualifying 09/16/23- 1 lap- HR 94/min, desat to 88%. On 2L POC walking O2 sat 94%.  =====================================================================================   09/16/23- 77 yoF Smoker(12.5 pkyrs) followed for OSA, Insomnia, complicated by  sCHF, CAD, Chronic DVT R lower leg, Mesenteric Ischemia, HTN, PAD, COPD( GOLD III)/ Dr Bertrum Brodie,  Chronic Respiratory Failure with Hypoxia, GERD, Hemoptysis, Hypothyroid,  Hx R Breast Cancer,                                Patient is hard of hearing. Husband is here. -Trelegy 200, Neb Duoneb, Ventolin  hfa, Neb Pulmicort , Dupixent , Lorazepam , TrazodoneRD, Hemoptysis, Hypothyroid,  Hx R Breast Cancer,                           O2 4L [facility] 2L sleep  CPAP auto 5-15/ with O2/ [facility]- Mitch from [employer] says they tried multiple times but couldn't contact her to replace old CPAP.  Body weight today-129 lbs Hosp July- CAP, COPD exacerbation, tobacco counseling,  Husband reports home O2 concentrator is old and getting very loud. O2 sat at rest on room air today 93% O2 sat POC qualifying 09/16/23- 1 lap- HR 94/min, desat to 88%. On 2L POC walking O2 sat 94%. Needs to replace old O2 concentrator and CPAP Needs to re-enroll patient assistance for Dupixent  which has significantly improved asthma control. Also needs refills on meds. Note Trelegy keept causing thrush.  01/24/24-78 yoF Smoker(12.5 pkyrs) followed for OSA,  Insomnia, complicated by  sdCHF, CAD, Chronic DVT R lower leg, Mesenteric Ischemia, HTN, PAD, COPD( GOLD III)/ Dr Bertrum Brodie,  Chronic Respiratory Failure with Hypoxia, GERD, Hemoptysis, Hypothyroid,  Hx R Breast Cancer,  L Subclavian Steal,                               Patient is hard of hearing. Husband is here. 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  Hosp in Feb with acute on chronic respiratory failure due to COPD exacerbation. HST last year AHI 12.1/hr Complains O2 concentrator is loud and hot. We discussed alternatives to CPAP and will refer to Orthodontics to consider oral appliance. [employer] suggested to discuss Od delivery options. [facility] to refit CPAP mask for comfort Discussed the use of AI scribe software for clinical note transcription with the patient, who gave verbal consent to proceed.  History of Present Illness   The patient, with a history of COPD and sleep apnea, reports that  her breathing has improved since her hospitalization in February. She uses oxygen  at 2 liters while sleeping and occasionally during the day when she feels unwell. The patient expresses dissatisfaction with her current oxygen  concentrator due to its noise and heat output. She also discusses her struggle with using the CPAP machine for sleep apnea, citing discomfort with the mask and difficulty adjusting it. The patient has tried different masks but has not found a suitable one yet. She also mentions that she has not been able to use the CPAP consistently.     History of Present Illness  CT chest r/o PE 12/08/23 IMPRESSION: 1. No evidence of arterial embolism. Slight cardiomegaly. No edema. 2. Interval decreased caliber  of the central airways which could be due to respiration or bronchospasm, with interval increased diffuse bronchial thickening and scattered subsegmental bronchial plugging. Findings consistent with worsening bronchitis. No focal consolidation. 3. Emphysema. 4. Aortic and coronary artery atherosclerosis. 5. Stable slightly prominent hilar lymph nodes. 6. Hiatal hernia. 7. Hepatic steatosis. Aortic Atherosclerosis (ICD10-I70.0) and Emphysema (ICD10-J43.9).   Assessment and Plan:    Chronic Obstructive Pulmonary Disease (COPD) Managed with home oxygen  therapy. Current equipment issues with noise and heat. Not under Dr. Mardell Shade care for COPD. Concerns about [facility]'s responsiveness. - Order replacement oxygen  concentrator from [facility]. - Provide information about [employer] for alternative concentrators. - Advise to contact clinic if [facility] is unresponsive.  Portable Oxygen  Therapy Non-functional battery in portable concentrator. Need solution for travel oxygen . [employer] suggested as provider. - Explore options with [employer] for portable concentrator.  Sleep Apnea Mild sleep apnea with AHI of 12. Discomfort with CPAP nasal pillows. Discussed alternatives like fitted mouthpiece. Insurance limitations noted. - Request [facility] to refit CPAP mask and provide consultation. - Refer to orthodontist Kalman Ores for oral appliance evaluation. - Discuss insurance coverage with Kalman Ores regarding oral appliance.     ROS-see HPI   + = positive Constitutional:    +weight loss, night sweats, fevers, chills, fatigue, lassitude. HEENT:    headaches, difficulty swallowing, tooth/dental problems, sore throat,       Sneezing,+ itching, ear ache, nasal congestion, post nasal drip, snoring CV:    chest pain, orthopnea, PND, swelling in lower extremities, anasarca,                                   dizziness, palpitations Resp:   +shortness of breath with exertion or at rest.                 +productive cough,   non-productive cough, +coughing up of blood.              change in color of mucus.  wheezing.   Skin:    +rash or lesions. GI:  + heartburn, +indigestion, abdominal pain, nausea, vomiting, diarrhea,                 change in bowel habits, loss of appetite GU: dysuria, change in color of urine, no urgency or frequency.   flank pain. MS:   joint pain, stiffness, decreased range of motion, back pain. Neuro-     nothing unusual Psych:  change in mood or affect.  +depression or anxiety.   memory loss.  OBJ- Physical Exam General- Alert, Oriented, Affect-appropriate, Distress- none acute, + thin/frail, + wheelchair Skin- rash-none, lesions- none, excoriation- none Lymphadenopathy- none Head- atraumatic  Eyes- Gross vision intact, PERRLA, conjunctivae and secretions clear            Ears- Hearing, canals-normal            Nose- Clear, no-Septal dev, mucus, polyps, erosion, perforation             Throat- Mallampati II , mucosa clear , drainage- none, tonsils- atrophic Neck- flexible , trachea midline, no stridor , thyroid  nl, carotid no bruit Chest - symmetrical excursion , unlabored           Heart/CV- RRR , no murmur , no gallop  , no rub, nl s1 s2                           - JVD- none , edema- none, stasis changes- none, varices- none           Lung- + decreased, no rhonchi, wheeze- none, cough+ dry, dullness-none, rub- none           Chest wall-  Abd-  Br/ Gen/ Rectal- Not done, not indicated Extrem- cyanosis- none, clubbing, none, atrophy- none, strength- nl Neuro- grossly intact to observation

## 2024-01-24 ENCOUNTER — Other Ambulatory Visit: Payer: Self-pay | Admitting: Family Medicine

## 2024-01-24 ENCOUNTER — Ambulatory Visit: Admitting: Internal Medicine

## 2024-01-24 VITALS — BP 140/76 | HR 67 | Temp 97.5°F | Ht 65.0 in | Wt 128.4 lb

## 2024-01-24 DIAGNOSIS — Z9981 Dependence on supplemental oxygen: Secondary | ICD-10-CM | POA: Diagnosis not present

## 2024-01-24 DIAGNOSIS — F17210 Nicotine dependence, cigarettes, uncomplicated: Secondary | ICD-10-CM | POA: Diagnosis not present

## 2024-01-24 DIAGNOSIS — J449 Chronic obstructive pulmonary disease, unspecified: Secondary | ICD-10-CM

## 2024-01-24 DIAGNOSIS — J9611 Chronic respiratory failure with hypoxia: Secondary | ICD-10-CM

## 2024-01-24 DIAGNOSIS — E039 Hypothyroidism, unspecified: Secondary | ICD-10-CM

## 2024-01-24 DIAGNOSIS — G4733 Obstructive sleep apnea (adult) (pediatric): Secondary | ICD-10-CM | POA: Diagnosis not present

## 2024-01-24 NOTE — Patient Instructions (Addendum)
 Order- DME [employer]- please refit CPAP mask of choice- face to face- for comfort and seal. Continue auto 5-15  Order- [employer]- POC 2L pulse   and home O2 concentrator  2L     dx  Chronic Respiratory Failure with Hypoxia   Use for sleep and as needed for activity   Order- Orthodontic referral to Dr Althea Grimmer, DDS    Consider oral appliance for OSA

## 2024-01-24 NOTE — Telephone Encounter (Unsigned)
 Copied from CRM [id number]. Topic: Clinical - Medication Refill >> Jan 24, 2024 11:09 AM Godfrey Pick wrote: Most Recent Primary Care Visit:  Provider: [place], [name]  Department: [facility]  Visit Type: PHYSICAL  Date: 12/27/2023  Medication: levothyroxine (SYNTHROID) 112 MCG tablet  Has the patient contacted their pharmacy? Yes (Agent: If no, request that the patient contact the pharmacy for the refill. If patient does not wish to contact the pharmacy document the reason why and proceed with request.) (Agent: If yes, when and what did the pharmacy advise?)  Is this the correct pharmacy for this prescription? Yes If no, delete pharmacy and type the correct one.  This is the patient's preferred pharmacy:   [facility] [place] [address] [place] [address] Phone: [phone] Fax: [phone]  [facility] - [place], [place] - [address] [place] [address] Phone: [phone] Fax: [phone]  [facility] - [facility] [address] [place] [address] Phone: [phone] Fax: [phone]   Has the prescription been filled recently? No  Is the patient out of the medication? No  Has the patient been seen for an appointment in the last year OR does the patient have an upcoming appointment? Yes  Can we respond through MyChart? No  Agent: Please be advised that Rx refills may take up to 3 business days. We ask that you follow-up with your pharmacy.

## 2024-01-24 NOTE — Telephone Encounter (Signed)
 Pt has multiple refills of Synthroid present.

## 2024-01-26 DIAGNOSIS — G4733 Obstructive sleep apnea (adult) (pediatric): Secondary | ICD-10-CM | POA: Diagnosis not present

## 2024-01-28 ENCOUNTER — Other Ambulatory Visit: Payer: Self-pay | Admitting: Family Medicine

## 2024-01-28 DIAGNOSIS — G4700 Insomnia, unspecified: Secondary | ICD-10-CM

## 2024-01-28 DIAGNOSIS — F419 Anxiety disorder, unspecified: Secondary | ICD-10-CM

## 2024-01-31 ENCOUNTER — Other Ambulatory Visit: Payer: Self-pay | Admitting: Family Medicine

## 2024-01-31 DIAGNOSIS — I10 Essential (primary) hypertension: Secondary | ICD-10-CM

## 2024-01-31 DIAGNOSIS — I2510 Atherosclerotic heart disease of native coronary artery without angina pectoris: Secondary | ICD-10-CM

## 2024-01-31 DIAGNOSIS — E785 Hyperlipidemia, unspecified: Secondary | ICD-10-CM

## 2024-01-31 NOTE — Telephone Encounter (Signed)
 Copied from CRM [id number]. Topic: Clinical - Medication Refill >> Jan 31, 2024 11:41 AM [name] wrote: Most Recent Primary Care Visit:  Provider: [place], [name]  Department: [facility]  Visit Type: PHYSICAL  Date: 12/27/2023  Medication: fluticasone  (FLONASE ) 50 MCG/ACT nasal spray ezetimibe  (ZETIA ) 10 MG tablet  Has the patient contacted their pharmacy? Yes (Agent: If no, request that the patient contact the pharmacy for the refill. If patient does not wish to contact the pharmacy document the reason why and proceed with request.) (Agent: If yes, when and what did the pharmacy advise?) Pt has no refill  Is this the correct pharmacy for this prescription? Yes If no, delete pharmacy and type the correct one.  This is the patient's preferred pharmacy:   [facility] [place] [address] [place] [address] Phone: [phone] Fax: [phone]   Has the prescription been filled recently? No  Is the patient out of the medication? Yes  Has the patient been seen for an appointment in the last year OR does the patient have an upcoming appointment? Yes  Can we respond through MyChart? Yes  Agent: Please be advised that Rx refills may take up to 3 business days. We ask that you follow-up with your pharmacy.

## 2024-02-03 MED ORDER — EZETIMIBE 10 MG PO TABS: 10.0000 mg | ORAL_TABLET | Freq: Every day | ORAL | 1 refills | Status: DC

## 2024-02-03 MED ORDER — FLUTICASONE PROPIONATE 50 MCG/ACT NA SUSP: NASAL | 2 refills | Status: DC

## 2024-02-04 ENCOUNTER — Ambulatory Visit: Payer: Self-pay

## 2024-02-04 NOTE — Telephone Encounter (Signed)
 Reason for Disposition  [1] Caller requesting NON-URGENT health information AND [2] PCP's office is the best resource  Answer Assessment - Initial Assessment Questions 1. REASON FOR CALL or QUESTION: "What is your reason for calling today?" or "How can I best help you?" or "What question do you have that I can help answer?"     PER [facility]SH[facility] WITH [facility], THE MED LORazepam  (ATIVAN ) 2 MG tablet SHOULD BE 90 DAY SUPPLY. THE CONTACT NUMBER IS [phone]  Protocols used: Information Only Call - No Triage-A-AH

## 2024-02-04 NOTE — Telephone Encounter (Signed)
 Tried Landscape [facility], unable to speak with anyone. PCP does not prescribe 90 days of controlled medications.

## 2024-02-09 ENCOUNTER — Encounter: Payer: Self-pay | Admitting: Internal Medicine

## 2024-02-11 ENCOUNTER — Other Ambulatory Visit: Payer: Self-pay

## 2024-02-11 DIAGNOSIS — F419 Anxiety disorder, unspecified: Secondary | ICD-10-CM

## 2024-02-11 DIAGNOSIS — G4700 Insomnia, unspecified: Secondary | ICD-10-CM

## 2024-02-13 ENCOUNTER — Other Ambulatory Visit: Payer: Self-pay | Admitting: Internal Medicine

## 2024-02-16 ENCOUNTER — Emergency Department (HOSPITAL_COMMUNITY)

## 2024-02-16 ENCOUNTER — Emergency Department (HOSPITAL_COMMUNITY)
Admission: EM | Admit: 2024-02-16 | Discharge: 2024-02-17 | Disposition: A | Discharge status: Home / Self Care | Attending: Emergency Medicine | Admitting: Emergency Medicine

## 2024-02-16 DIAGNOSIS — R29818 Other symptoms and signs involving the nervous system: Secondary | ICD-10-CM | POA: Diagnosis not present

## 2024-02-16 DIAGNOSIS — Z79899 Other long term (current) drug therapy: Secondary | ICD-10-CM | POA: Insufficient documentation

## 2024-02-16 DIAGNOSIS — R4781 Slurred speech: Secondary | ICD-10-CM

## 2024-02-16 DIAGNOSIS — F10920 Alcohol use, unspecified with intoxication, uncomplicated: Secondary | ICD-10-CM | POA: Insufficient documentation

## 2024-02-16 DIAGNOSIS — R001 Bradycardia, unspecified: Secondary | ICD-10-CM | POA: Diagnosis not present

## 2024-02-16 DIAGNOSIS — Z7901 Long term (current) use of anticoagulants: Secondary | ICD-10-CM | POA: Insufficient documentation

## 2024-02-16 DIAGNOSIS — Z8551 Personal history of malignant neoplasm of bladder: Secondary | ICD-10-CM | POA: Insufficient documentation

## 2024-02-16 DIAGNOSIS — E039 Hypothyroidism, unspecified: Secondary | ICD-10-CM | POA: Diagnosis not present

## 2024-02-16 DIAGNOSIS — Z853 Personal history of malignant neoplasm of breast: Secondary | ICD-10-CM | POA: Diagnosis not present

## 2024-02-16 DIAGNOSIS — E876 Hypokalemia: Secondary | ICD-10-CM | POA: Insufficient documentation

## 2024-02-16 DIAGNOSIS — J449 Chronic obstructive pulmonary disease, unspecified: Secondary | ICD-10-CM | POA: Diagnosis not present

## 2024-02-16 DIAGNOSIS — Y906 Blood alcohol level of 120-199 mg/100 ml: Secondary | ICD-10-CM | POA: Diagnosis not present

## 2024-02-16 DIAGNOSIS — F10129 Alcohol abuse with intoxication, unspecified: Secondary | ICD-10-CM | POA: Diagnosis not present

## 2024-02-16 DIAGNOSIS — N189 Chronic kidney disease, unspecified: Secondary | ICD-10-CM | POA: Diagnosis not present

## 2024-02-16 DIAGNOSIS — I2510 Atherosclerotic heart disease of native coronary artery without angina pectoris: Secondary | ICD-10-CM | POA: Diagnosis not present

## 2024-02-16 DIAGNOSIS — I129 Hypertensive chronic kidney disease with stage 1 through stage 4 chronic kidney disease, or unspecified chronic kidney disease: Secondary | ICD-10-CM | POA: Insufficient documentation

## 2024-02-16 LAB — DIFFERENTIAL
Abs Immature Granulocytes: 0.03 10*3/uL (ref 0.00–0.07)
Basophils Absolute: 0.1 10*3/uL (ref 0.0–0.1)
Basophils Relative: 1 %
Eosinophils Absolute: 0.6 10*3/uL — ABNORMAL HIGH (ref 0.0–0.5)
Eosinophils Relative: 9 %
Immature Granulocytes: 1 %
Lymphocytes Relative: 19 %
Lymphs Abs: 1.2 10*3/uL (ref 0.7–4.0)
Monocytes Absolute: 0.6 10*3/uL (ref 0.1–1.0)
Monocytes Relative: 9 %
Neutro Abs: 3.7 10*3/uL (ref 1.7–7.7)
Neutrophils Relative %: 61 %

## 2024-02-16 LAB — COMPREHENSIVE METABOLIC PANEL WITH GFR
ALT: 39 U/L (ref 0–44)
AST: 40 U/L (ref 15–41)
Albumin: 3.6 g/dL (ref 3.5–5.0)
Alkaline Phosphatase: 92 U/L (ref 38–126)
Anion gap: 13 (ref 5–15)
BUN: 16 mg/dL (ref 8–23)
CO2: 23 mmol/L (ref 22–32)
Calcium: 8.6 mg/dL — ABNORMAL LOW (ref 8.9–10.3)
Chloride: 101 mmol/L (ref 98–111)
Creatinine, Ser: 0.83 mg/dL (ref 0.44–1.00)
GFR, Estimated: >60 mL/min (ref >60)
Glucose, Bld: 232 mg/dL — ABNORMAL HIGH (ref 70–99)
Potassium: 2.8 mmol/L — ABNORMAL LOW (ref 3.5–5.1)
Sodium: 137 mmol/L (ref 135–145)
Total Bilirubin: 0.4 mg/dL (ref 0.0–1.2)
Total Protein: 7.3 g/dL (ref 6.5–8.1)

## 2024-02-16 LAB — CBC
HCT: 43 % (ref 36.0–46.0)
Hemoglobin: 14.3 g/dL (ref 12.0–15.0)
MCH: 30.7 pg (ref 26.0–34.0)
MCHC: 33.3 g/dL (ref 30.0–36.0)
MCV: 92.3 fL (ref 80.0–100.0)
Platelets: 363 10*3/uL (ref 150–400)
RBC: 4.66 MIL/uL (ref 3.87–5.11)
RDW: 13.7 % (ref 11.5–15.5)
WBC: 6.1 10*3/uL (ref 4.0–10.5)
nRBC: 0 % (ref 0.0–0.2)

## 2024-02-16 LAB — RAPID URINE DRUG SCREEN, HOSP PERFORMED
Amphetamines: NOT DETECTED
Barbiturates: NOT DETECTED
Benzodiazepines: NOT DETECTED
Cocaine: NOT DETECTED
Opiates: NOT DETECTED
Tetrahydrocannabinol: NOT DETECTED

## 2024-02-16 LAB — PROTIME-INR
INR: 1 (ref 0.8–1.2)
Prothrombin Time: 12.9 s (ref 11.4–15.2)

## 2024-02-16 LAB — I-STAT CHEM 8, ED
BUN: 19 mg/dL (ref 8–23)
Calcium, Ion: 1 mmol/L — ABNORMAL LOW (ref 1.15–1.40)
Chloride: 101 mmol/L (ref 98–111)
Creatinine, Ser: 1 mg/dL (ref 0.44–1.00)
Glucose, Bld: 235 mg/dL — ABNORMAL HIGH (ref 70–99)
HCT: 45 % (ref 36.0–46.0)
Hemoglobin: 15.3 g/dL — ABNORMAL HIGH (ref 12.0–15.0)
Potassium: 3.1 mmol/L — ABNORMAL LOW (ref 3.5–5.1)
Sodium: 138 mmol/L (ref 135–145)
TCO2: 23 mmol/L (ref 22–32)

## 2024-02-16 LAB — MAGNESIUM: Magnesium: 1.8 mg/dL (ref 1.7–2.4)

## 2024-02-16 LAB — ETHANOL: Alcohol, Ethyl (B): 162 mg/dL — ABNORMAL HIGH

## 2024-02-16 LAB — APTT: aPTT: 28 s (ref 24–36)

## 2024-02-16 MED ORDER — LACTATED RINGERS IV BOLUS
1000.0000 mL | Freq: Once | INTRAVENOUS | Status: AC
  Administered 2024-02-16: 1000 mL via INTRAVENOUS

## 2024-02-16 MED ORDER — POTASSIUM CHLORIDE 10 MEQ/100ML IV SOLN
10.0000 meq | Freq: Once | INTRAVENOUS | Status: AC
  Administered 2024-02-16: 10 meq via INTRAVENOUS
  Filled 2024-02-16: qty 100

## 2024-02-16 MED ORDER — DEXTROSE 50 % IV SOLN
25.0000 g | Freq: Once | INTRAVENOUS | Status: AC
  Administered 2024-02-16: 25 g via INTRAVENOUS

## 2024-02-16 MED ORDER — POTASSIUM CHLORIDE CRYS ER 20 MEQ PO TBCR
40.0000 meq | EXTENDED_RELEASE_TABLET | Freq: Once | ORAL | Status: AC
  Administered 2024-02-16: 40 meq via ORAL
  Filled 2024-02-16: qty 2

## 2024-02-16 NOTE — ED Provider Notes (Signed)
 [place] EMERGENCY DEPARTMENT AT [facility] Provider Note   CSN: [id number] Arrival date & time: 02/16/24  2047  An emergency department physician performed an initial assessment on this suspected stroke patient at 2039.  History  Chief Complaint  Patient presents with   Code Stroke     [name] is a 78 y.o. female.  Patient is a 78 year old female with a history of GERD, hypertension, hypothyroidism, prior breast and bladder cancer, PVD, DVT, CAD, CKD and COPD who is presenting today as a code stroke.  It was reported that patient was last seen normal at 430 this afternoon.  Her husband reported they had had some dinner she had had a few drinks and when she went and took a nap.  When she woke up around 6 she had slurred speech and possible left facial droop.  Also her husband reported she was not acting right.  EMS was called.  Patient is denying any chest pain, shortness of breath.  EMS report vital signs have been stable and sugar was normal.  They had noted some left sided facial droop but most notably slurred speech and route also some coordination issues..  There was no history of trauma.  The history is provided by the patient, the EMS personnel and medical records.       Home Medications Prior to Admission medications   Medication Sig Start Date End Date Taking? Authorizing Provider  albuterol  (VENTOLIN  HFA) 108 (90 Base) MCG/ACT inhaler INHALE TWO PUFFS BY MOUTH EVERY 6 HOURS AS NEEDED FOR WHEEZE OR SHORTNESS OF BREATH 02/13/24   Young, Clinton D, MD  ammonium lactate (AMLACTIN DAILY) 12 % lotion Apply 1 Application topically daily. 09/02/23   [provider]  aspirin  EC 81 MG tablet Take 1 tablet (81 mg total) by mouth daily. 08/21/20   Abbe Abate, MD  atorvastatin  (LIPITOR ) 80 MG tablet Take 1 tablet (80 mg total) by mouth daily. 04/09/23   [place], [name], MD  budesonide  (PULMICORT ) 0.5 MG/2ML nebulizer solution Take 2 mLs (0.5 mg total) by  nebulization 2 (two) times daily. 09/16/23   Rosa College D, MD  cilostazol  (PLETAL ) 100 MG tablet TAKE 1 TABLET BY MOUTH TWICE  DAILY 01/23/24   Marlyse Single T, PA-C  clobetasol (TEMOVATE) 0.05 % external solution Apply 1 Application topically 2 (two) times daily. 09/02/23   [provider]  clopidogrel  (PLAVIX ) 75 MG tablet TAKE ONE TABLET BY MOUTH DAILY AT 9AM 08/13/23   Adine Hoof, MD  DUPIXENT  300 MG/2ML SOPN Inject 300 mg into the skin every 14 (fourteen) days. 01/03/21   [provider]  ezetimibe  (ZETIA ) 10 MG tablet Take 1 tablet (10 mg total) by mouth daily. 02/03/24   [place], [name], MD  FEROSUL 325 (65 Fe) MG tablet TAKE ONE TABLET BY MOUTH DAILY AT 9AM WITH BREAKFAST 10/02/23   [place], [name], MD  fluticasone  (FLONASE ) 50 MCG/ACT nasal spray SPRAY 1 SPRAY IN EACH NOSTRIL TWICE DAILY 02/03/24   [place], [name], MD  furosemide  (LASIX ) 40 MG tablet TAKE ONE TABLET BY MOUTH TWICE DAILY 11/01/23   [place], [name], MD  hydrOXYzine  (ATARAX ) 25 MG tablet Take 25 mg by mouth at bedtime.    [provider]  ipratropium-albuterol  (DUONEB) 0.5-2.5 (3) MG/3ML SOLN Inhale 3 mLs into the lungs as directed. TID for 1 week and then use as needed Q6 hours. Patient taking differently: Inhale 3 mLs into the lungs every 6 (six) hours as  needed (wheezing/SOB). 09/16/23   Faustina Hood, MD  levothyroxine  (SYNTHROID ) 112 MCG tablet TAKE 1/2 TABLET BY MOUTH TUESDAY AND THURSDAY and TAKE ONE TABLET BY MOUTH ON MONDAY, WEDNESDAY, FRIDAY, SATURDAY & SUNDAY 01/17/24   [place], [name], MD  LORazepam  (ATIVAN ) 2 MG tablet TAKE ONE TABLET (2 MG TOTAL) BY MOUTH DAILY AT BEDTIME 02/03/24   [place], [name], MD  montelukast  (SINGULAIR ) 10 MG tablet TAKE 1 TABLET BY MOUTH EVERYDAY AT BEDTIME Patient taking differently: Take 10 mg by mouth at bedtime. 04/21/21   Antonio Baumgarten, NP  pantoprazole  (PROTONIX ) 20 MG tablet Take 1 tablet (20 mg total) by mouth 2 (two) times daily before a  meal. 12/27/23   [place], [name], MD  PARoxetine  (PAXIL ) 20 MG tablet TAKE ONE TABLET BY MOUTH DAILY AT 9AM 01/03/24   [place], [name], MD  potassium chloride  SA (KLOR-CON  M) 20 MEQ tablet TAKE 1 TABLET BY MOUTH DAILY 08/30/23   [place], [name], MD  tamsulosin  (FLOMAX ) 0.4 MG CAPS capsule TAKE 1 CAPSULE BY MOUTH DAILY 09/24/23   [place], [name], MD  traZODone  (DESYREL ) 100 MG tablet TAKE ONE TABLET BY MOUTH DAILY AT 9PM AT BEDTIME 11/08/23   [place], [name], MD  TRELEGY ELLIPTA  200-62.5-25 MCG/ACT AEPB INHALE 1 PUFF BY MOUTH ONCE DAILY (RINSE MOUTH AFTER EACH USE) (BULK) 06/13/23   Young, Rupert Counts D, MD  urea (CARMOL) 40 % CREA Apply 1 Application topically daily as needed (eczema). 09/09/23   [provider]      Allergies    Sinequan  [doxepin ], Sulfa antibiotics, and Sulfur    Review of Systems   Review of Systems  Physical Exam Updated Vital Signs BP (!) 147/75   Pulse (!) 59   Temp (!) 97.5 F (36.4 C) (Oral)   Resp 18   Wt 60.8 kg   SpO2 100%   BMI 22.31 kg/m  Physical Exam Vitals and nursing note reviewed.  Constitutional:      General: She is not in acute distress.    Appearance: She is well-developed.  HENT:     Head: Normocephalic and atraumatic.  Eyes:     Pupils: Pupils are equal, round, and reactive to light.  Cardiovascular:     Rate and Rhythm: Normal rate and regular rhythm.     Heart sounds: Normal heart sounds. No murmur heard.    No friction rub.  Pulmonary:     Effort: Pulmonary effort is normal.     Breath sounds: Normal breath sounds. No wheezing or rales.  Abdominal:     General: Bowel sounds are normal. There is no distension.     Palpations: Abdomen is soft.     Tenderness: There is no abdominal tenderness. There is no guarding or rebound.  Musculoskeletal:        General: No tenderness. Normal range of motion.     Comments: No edema  Skin:    General: Skin is warm and dry.     Findings: No rash.  Neurological:     Mental Status: She is  alert and oriented to person, place, and time.     Cranial Nerves: Dysarthria present. No cranial nerve deficit.     Sensory: Sensation is intact.     Motor: Motor function is intact. No weakness or pronator drift.     Coordination: Heel to Shin Test normal.     Comments: Slight droop by the left lip  Psychiatric:        Behavior:  Behavior normal.     ED Results / Procedures / Treatments   Labs (all labs ordered are listed, but only abnormal results are displayed) Labs Reviewed  ETHANOL - Abnormal; Notable for the following components:      Result Value   Alcohol, Ethyl (B) 162 (*)    All other components within normal limits  DIFFERENTIAL - Abnormal; Notable for the following components:   Eosinophils Absolute 0.6 (*)    All other components within normal limits  COMPREHENSIVE METABOLIC PANEL WITH GFR - Abnormal; Notable for the following components:   Potassium 2.8 (*)    Glucose, Bld 232 (*)    Calcium  8.6 (*)    All other components within normal limits  I-STAT CHEM 8, ED - Abnormal; Notable for the following components:   Potassium 3.1 (*)    Glucose, Bld 235 (*)    Calcium , Ion 1.00 (*)    Hemoglobin 15.3 (*)    All other components within normal limits  PROTIME-INR  APTT  CBC  RAPID URINE DRUG SCREEN, HOSP PERFORMED  MAGNESIUM     EKG EKG Interpretation Date/Time:  Sunday Feb 16 2024 21:33:45 EDT Ventricular Rate:  56 PR Interval:  192 QRS Duration:  110 QT Interval:  483 QTC Calculation: 467 R Axis:   60  Text Interpretation: Sinus rhythm Baseline wander in lead(s) V5 No significant change since last tracing Confirmed by Almond Army ([id number]) on 02/16/2024 9:40:05 PM  Radiology MR BRAIN WO CONTRAST Result Date: 02/16/2024 CLINICAL DATA:  Neuro deficit, acute, stroke suspected EXAM: MRI HEAD WITHOUT CONTRAST TECHNIQUE: Multiplanar, multiecho pulse sequences of the brain and surrounding structures were obtained without intravenous contrast. COMPARISON:   Same day CT head. FINDINGS: Brain: No acute infarction, hemorrhage, hydrocephalus, extra-axial collection or mass lesion. Vascular: Major arterial flow voids are maintained at the skull base. Skull and upper cervical spine: Normal marrow signal. Sinuses/Orbits: Mild paranasal sinus mucosal thickening. No acute orbital findings. Other: Moderate right mastoid effusion. IMPRESSION: No evidence of acute intracranial abnormality. Electronically Signed   By: Stevenson Elbe M.D.   On: 02/16/2024 22:36   CT HEAD CODE STROKE WO CONTRAST Result Date: 02/16/2024 CLINICAL DATA:  Code stroke.  Neuro deficit, acute, stroke suspected EXAM: CT HEAD WITHOUT CONTRAST TECHNIQUE: Contiguous axial images were obtained from the base of the skull through the vertex without intravenous contrast. RADIATION DOSE REDUCTION: This exam was performed according to the departmental dose-optimization program which includes automated exposure control, adjustment of the mA and/or kV according to patient size and/or use of iterative reconstruction technique. COMPARISON:  MRI head August 22, 2011. FINDINGS: Brain: No evidence of acute large vascular territory infarction, hemorrhage, hydrocephalus, extra-axial collection or mass lesion/mass effect. Vascular: No hyperdense vessel. Skull: No acute fracture. Sinuses/Orbits: Paranasal sinus mucosal thickening. No acute orbital findings. Other: No mastoid effusions. ASPECTS [facility] Stroke Program Early CT Score) Total score (0-10 with 10 being normal): 10 IMPRESSION: No evidence of acute intracranial abnormality. Code stroke imaging results were communicated on 02/16/2024 at 9:06 pm to provider Dr. Bonnita Buttner via secure text paging. Electronically Signed   By: Stevenson Elbe M.D.   On: 02/16/2024 21:07    Procedures Procedures    Medications Ordered in ED Medications  potassium chloride  10 mEq in 100 mL IVPB (10 mEq Intravenous New Bag/Given 02/16/24 2246)  dextrose  50 % solution 25 g (25 g  Intravenous Given 02/16/24 2105)  lactated ringers  bolus 1,000 mL (1,000 mLs Intravenous New Bag/Given 02/16/24 2246)  potassium chloride  SA (  KLOR-CON  M) CR tablet 40 mEq (40 mEq Oral Given 02/16/24 2247)    ED Course/ Medical Decision Making/ A&P                                 Medical Decision Making Amount and/or Complexity of Data Reviewed External Data Reviewed: notes. Labs: ordered. Decision-making details documented in ED Course. Radiology: ordered and independent interpretation performed. Decision-making details documented in ED Course.  Risk Prescription drug management.   Pt with multiple medical problems and comorbidities and presenting today with a complaint that caries a high risk for morbidity and mortality.  Here today as a code stroke.  Concern for stroke versus intoxication versus electrolyte abnormalities versus intracranial hemorrhage.  Low suspicion for migraine headache or seizure.  Low suspicion for infectious etiology. 11:37 PM I independently interpreted patient's labs and EKG.  EKG without acute findings today, Chem-8 with hypokalemia of 3.1 and blood sugar of 235 with normal creatinine, EtOH of 162, normal coags, CBC within normal limits, CMP with distant hypokalemia of 2.8 and UDS is negative.  Magnesium  within normal limits.  I have independently visualized and interpreted pt's images today.  CT today without evidence of bleed.  Radiology reports no acute findings.  Neurology has ordered an MRI.  Will replace potassium and give IV fluids.  11:37 PM MRI was neg for acute pathology.  Pt stable for d/c home once sober.         Final Clinical Impression(s) / ED Diagnoses Final diagnoses:  Alcoholic intoxication without complication (HCC)  Hypokalemia  Slurred speech    Rx / DC Orders ED Discharge Orders     None         Almond Army, MD 02/16/24 2337

## 2024-02-16 NOTE — ED Notes (Signed)
 Transported to MRI

## 2024-02-16 NOTE — ED Notes (Signed)
Transported to CT scan

## 2024-02-16 NOTE — ED Notes (Signed)
 Returned from MRI

## 2024-02-16 NOTE — ED Notes (Signed)
Patient currently at MRI

## 2024-02-16 NOTE — ED Triage Notes (Signed)
 Patient arrived with EMS from home as a Code Stroke activation , LSW 6 pm this evening , family reported Left facial droop and slurred speech . No IV access , CBG= 83 .

## 2024-02-16 NOTE — Consult Note (Signed)
 NEUROLOGY CONSULT NOTE   Date of service: Feb 16, 2024 Patient Name: [name] MRN:  [id number] DOB:  [birth year] Chief Complaint: "Slurred speech-code stroke" Requesting Provider: Almond Army, MD  History of Present Illness  [name] is a 78 y.o. female with hx of congestive heart failure, COPD, alcohol use, fibromyalgia, history of breast cancer, history of pulmonary nodules, hyperlipidemia, hypertension brought in for evaluation of strokelike symptoms of slurred speech and possible left-sided facial droop with the last known well initially 1630 hrs. but later verified by family of 1800 hrs. The patient was sitting in the porch with her husband and it was noted somewhere around 6 PM that she was in her usual state of health and went to take a nap.  When they tried to wake her up later, she was noted to have slurred speech and possibly some left facial droop. She had also urinated on herself. According to the family, she had had a few drinks-there have been reports probably 2 drinks but the son says that she usually drinks 4-8 drinks a day. Seen at the bridge as an acute stroke-see detailed exam and imaging findings below  Sugars on scene noted to be in the 80s.  Sugars in the ER was 73. Given D50.  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)    05/ 2018  chronic non-occlusive DVT RLE   Chronic kidney disease    COPD, frequent exacerbations (HCC)    03-06-2018 per pt last exacerbation 12/ 2018   Coronary artery disease    cardiologist-  dr Mitzie Anda--  03-11-2018 er cath , 80% stenosis in the ostial second diagonal   Dyspnea on minimal exertion    Emphysema/COPD (HCC)    CAT score- 17   Family history of breast cancer    Family history of colon cancer    Family history of melanoma    Family history of ovarian cancer    Family history of pancreatic cancer    Fibromyalgia 1995   GERD (gastroesophageal reflux disease)    Hiatal hernia    History of breast cancer    History of diverticulitis of colon    2002  s/p  sigmoid colectomy   History of multiple pulmonary nodules    hx RUL nodules x2  s/p right VATS w/ wedge resection 09-11-2005 and 06-14-2011  both  necrotizing granulomatous inflammation w/ cystic area of necrosis & focal calcification   History of right breast cancer oncologist-  dr Charolett Copes-- no recurrence   dx 2010--  IDC, Stage IA , ER/PR+,  (pT1c pN0) 11-09-2008 right lumpectomy;  12-18-2008 right simple mastectomy for DCIS margins;  completed chemotherapy 2010 (no radiation) and completed antiestrogen therapy   Hx of colonic polyps    Dr. 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   Nocturia    OA (osteoarthritis)    thumbs  On supplemental oxygen  therapy    03-06-2018 per pt uses only at night,  checks O2 sats at home,  stated am sat 89% after moving around average 93-94% with RA   Osteoporosis    Peripheral arterial occlusive disease (HCC) vascular-- dr chen/ dr Vikki Graves   proximal right SFA severe focal stenosis with collaterals from the PFA/  04/ 2012 occluded celiac and SMA arteries with distal reconstitution w/ patent IMA   Peripheral vascular disease (HCC)    chronic DVT RLE,  mesenteric vascular disease   Pneumonia    Right lower lobe pulmonary nodule    Chest CT 08-29-2017   Sleep apnea    Stage 3 severe COPD by GOLD classification (HCC) hx frequent exacerbations--   pulmologist-  dr Jennie Moeller--  per lov  note , dated 12-20-2017, oxyogen 2L is prescribed for use with exertion (but pt only uses mostly at night), O2 sats on RA run the 80s , this day sat 86% RA and with 2L O2 sat 92%   Varicose veins of leg with swelling    varicose vein surgery - Dr. Arvilla Laud   Wears glasses    Wears hearing aid in both ears     Past Surgical History:  Procedure Laterality Date   ABDOMINAL AORTOGRAM N/A 07/11/2020   Procedure: ABDOMINAL AORTOGRAM;  Surgeon: Adine Hoof, MD;  Location: [facility] INVASIVE CV LAB;  Service: Cardiovascular;  Laterality: N/A;   AORTIC ARCH ANGIOGRAPHY N/A 07/11/2020   Procedure: AORTIC ARCH ANGIOGRAPHY;  Surgeon: Adine Hoof, MD;  Location: [facility] INVASIVE CV LAB;  Service: Cardiovascular;  Laterality: N/A;   BREAST EXCISIONAL BIOPSY Left 10/15/2008   BREAST LUMPECTOMY W/ NEEDLE LOCALIZATION Right 11-09-2008    dr cornett   [facility]   CARDIAC CATHETERIZATION  03-12-2011   dr Mitzie Anda   70-80% ostial stenosis in small second diagonal (appears to small for intervention),  dLAD 40-50%,  minimal luminal irregulartieis involoving LCFx and RCA,  LVEF 55%   CATARACT EXTRACTION W/ INTRAOCULAR LENS  IMPLANT, BILATERAL  2011   CYSTOSCOPY N/A 03/11/2018   Procedure: CYSTOSCOPY WITH INSTILLATION OF POST OPERATIVE EPIRUBICIN ;  Surgeon: Christina Coyer, MD;  Location: [facility] ORS;  Service: Urology;  Laterality: N/A;   LEFT HEART CATH AND CORONARY ANGIOGRAPHY N/A 05/25/2020   Procedure: LEFT HEART CATH AND CORONARY ANGIOGRAPHY;  Surgeon: Millicent Ally, MD;  Location: [facility] INVASIVE CV LAB;  Service: Cardiovascular;  Laterality: N/A;   MASTECTOMY Right    PARTIAL COLECTOMY  2002   sigmoid and appectomy (diverticulitis)   PARTIAL MASTECTOMY WITH NEEDLE LOCALIZATION Left 02/23/2013   Procedure:  LEFT PARTIAL MASTECTOMY WITH NEEDLE LOCALIZATION;  Surgeon: Levert Ready, MD;  Location: [facility];  Service: General;  Laterality: Left;   PERIPHERAL VASCULAR INTERVENTION   07/11/2020   Procedure: PERIPHERAL VASCULAR INTERVENTION;  Surgeon: Adine Hoof, MD;  Location: [facility] INVASIVE CV LAB;  Service: Cardiovascular;;   RECONSTRUCTION BREAST W/ LATISSIMUS DORSI FLAP Right 05-30-2009   dr Hobart Lulas  [facility]   REDUCTION MAMMAPLASTY Left    SIMPLE MASTECTOMY Right 12-28-2008    dr Afton Horse  [facility]   TRANSFORAMINAL LUMBAR INTERBODY FUSION W/ MIS 1 LEVEL Left 10/22/2022   Procedure: LUMBAR THREE-FOUR MINIMALLY INVASIVE TRANSFORAMINAL LUMBAR INTERBODY FUSION;  Surgeon: Cannon Champion, MD;  Location: [facility] OR;  Service: Neurosurgery;  Laterality: Left;  3C/RM 21 to follow   TRANSTHORACIC ECHOCARDIOGRAM  07-09-2016  dr Mitzie Anda   ef 55-60%, grade 1 diastoic dysfunction/  mild TR  TRANSURETHRAL RESECTION OF BLADDER TUMOR N/A 03/11/2018   Procedure: TRANSURETHRAL RESECTION OF BLADDER TUMOR (TURBT) 2-5cm;  Surgeon: Christina Coyer, MD;  Location: [facility];  Service: Urology;  Laterality: N/A;   UPPER EXTREMITY ANGIOGRAPHY Left 07/11/2020   Procedure: Upper Extremity Angiography;  Surgeon: Adine Hoof, MD;  Location: [facility] INVASIVE CV LAB;  Service: Cardiovascular;  Laterality: Left;   VAGINAL HYSTERECTOMY  1973   VIDEO ASSISTED THORACOSCOPY (VATS)/WEDGE RESECTION Right 09-11-2005  &  06-14-2011   dr hendrickso  [facility]   both RUL    Family History: Family History  Problem Relation Age of Onset   Colon cancer Mother        colon   COPD Mother        brown lung   Hypertension Mother    Diabetes Mother    Emphysema Mother    Coronary artery disease Father    Heart attack Father    Sudden death Father    Heart disease Father    Cancer Sister        throat   Hypertension Sister    Coronary artery disease Brother    Heart attack Brother        early 9s   Throat cancer Sister    Ovarian cancer Sister        fallopian tube cancer in her 67s   Melanoma Sister    Hypertension Sister    Pancreatic cancer Sister    Melanoma Niece        dx in her 59s    Breast cancer Niece 27    Social History  reports that she quit smoking about 7 years ago. Her smoking use included cigarettes. She started smoking about 57 years ago. She has a 12.5 pack-year smoking history. She has never used smokeless tobacco. She reports current alcohol use of about 14.0 standard drinks of alcohol per week. She reports that she does not use drugs.  Allergies  Allergen Reactions   Sinequan  [Doxepin ] Other (See Comments)    Kept her awake    Sulfa Antibiotics Swelling   Sulfur     Other reaction(s): Unknown    Medications  No current facility-administered medications for this encounter.  Current Outpatient Medications:    albuterol  (VENTOLIN  HFA) 108 (90 Base) MCG/ACT inhaler, INHALE TWO PUFFS BY MOUTH EVERY 6 HOURS AS NEEDED FOR WHEEZE OR SHORTNESS OF BREATH, Disp: 18 g, Rfl: 7   ammonium lactate (AMLACTIN DAILY) 12 % lotion, Apply 1 Application topically daily., Disp: , Rfl:    aspirin  EC 81 MG tablet, Take 1 tablet (81 mg total) by mouth daily., Disp: 30 tablet, Rfl:    atorvastatin  (LIPITOR ) 80 MG tablet, Take 1 tablet (80 mg total) by mouth daily., Disp: 90 tablet, Rfl: 3   budesonide  (PULMICORT ) 0.5 MG/2ML nebulizer solution, Take 2 mLs (0.5 mg total) by nebulization 2 (two) times daily., Disp: 20 mL, Rfl: 12   cilostazol  (PLETAL ) 100 MG tablet, TAKE 1 TABLET BY MOUTH TWICE  DAILY, Disp: 30 tablet, Rfl: 0   clobetasol (TEMOVATE) 0.05 % external solution, Apply 1 Application topically 2 (two) times daily., Disp: , Rfl:    clopidogrel  (PLAVIX ) 75 MG tablet, TAKE ONE TABLET BY MOUTH DAILY AT 9AM, Disp: 90 tablet, Rfl: 5   DUPIXENT  300 MG/2ML SOPN, Inject 300 mg into the skin every 14 (fourteen) days., Disp: , Rfl:    ezetimibe  (ZETIA ) 10 MG tablet, Take 1 tablet (10 mg total) by mouth daily., Disp: 90 tablet, Rfl:  1   FEROSUL 325 (65 Fe) MG tablet, TAKE ONE TABLET BY MOUTH DAILY AT 9AM WITH BREAKFAST, Disp: 90 tablet, Rfl: 2   fluticasone  (FLONASE ) 50 MCG/ACT  nasal spray, SPRAY 1 SPRAY IN EACH NOSTRIL TWICE DAILY, Disp: 48 g, Rfl: 2   furosemide  (LASIX ) 40 MG tablet, TAKE ONE TABLET BY MOUTH TWICE DAILY, Disp: 180 tablet, Rfl: 2   hydrOXYzine  (ATARAX ) 25 MG tablet, Take 25 mg by mouth at bedtime., Disp: , Rfl:    ipratropium-albuterol  (DUONEB) 0.5-2.5 (3) MG/3ML SOLN, Inhale 3 mLs into the lungs as directed. TID for 1 week and then use as needed Q6 hours. (Patient taking differently: Inhale 3 mLs into the lungs every 6 (six) hours as needed (wheezing/SOB).), Disp: 360 mL, Rfl: 4   levothyroxine  (SYNTHROID ) 112 MCG tablet, TAKE 1/2 TABLET BY MOUTH TUESDAY AND THURSDAY and TAKE ONE TABLET BY MOUTH ON MONDAY, WEDNESDAY, FRIDAY, SATURDAY & SUNDAY, Disp: 86 tablet, Rfl: 11   LORazepam  (ATIVAN ) 2 MG tablet, TAKE ONE TABLET (2 MG TOTAL) BY MOUTH DAILY AT BEDTIME, Disp: 30 tablet, Rfl: 3   montelukast  (SINGULAIR ) 10 MG tablet, TAKE 1 TABLET BY MOUTH EVERYDAY AT BEDTIME (Patient taking differently: Take 10 mg by mouth at bedtime.), Disp: 90 tablet, Rfl: 1   pantoprazole  (PROTONIX ) 20 MG tablet, Take 1 tablet (20 mg total) by mouth 2 (two) times daily before a meal., Disp: 180 tablet, Rfl: 3   PARoxetine  (PAXIL ) 20 MG tablet, TAKE ONE TABLET BY MOUTH DAILY AT 9AM, Disp: 90 tablet, Rfl: 2   potassium chloride  SA (KLOR-CON  M) 20 MEQ tablet, TAKE 1 TABLET BY MOUTH DAILY, Disp: 100 tablet, Rfl: 2   tamsulosin  (FLOMAX ) 0.4 MG CAPS capsule, TAKE 1 CAPSULE BY MOUTH DAILY, Disp: 100 capsule, Rfl: 2   traZODone  (DESYREL ) 100 MG tablet, TAKE ONE TABLET BY MOUTH DAILY AT 9PM AT BEDTIME, Disp: 90 tablet, Rfl: 2   TRELEGY ELLIPTA  200-62.5-25 MCG/ACT AEPB, INHALE 1 PUFF BY MOUTH ONCE DAILY (RINSE MOUTH AFTER EACH USE) (BULK), Disp: 60 each, Rfl: 11   urea (CARMOL) 40 % CREA, Apply 1 Application topically daily as needed (eczema)., Disp: , Rfl:   Vitals   Vitals:   22-Feb-2024 2000  Weight: 60.8 kg    Body mass index is 22.31 kg/m.  Physical Exam  General: Somewhat drowsy  but participates in the exam. HEENT: Normocephalic atraumatic Lungs: Clear Cardiovascular: Regular rhythm Neurological exam She is somewhat drowsy but awakens to voice.  Able to name objects able to repeat.  Speech is slurred and dysarthric. She is unable to tell me her correct age or the current month. She said she is 78 year old and its June. Cranial nerves II to XII appear intact on my examination although there was a question of a subtle left lower facial weakness by the ED provider. Motor examination reveals no drift in any of the 4 extremities Sensation intact to light touch Coordination appears impaired in all 4 extremities   Labs/Imaging/Neurodiagnostic studies   CBC:  Recent Labs  Lab 02/22/24 2130  WBC 6.1  NEUTROABS 3.7  HGB 14.3  15.3*  HCT 43.0  45.0  MCV 92.3  PLT 363   Basic Metabolic Panel:  Lab Results  Component Value Date   NA 135 12/11/2023   K 3.9 12/11/2023   CO2 23 12/11/2023   GLUCOSE 95 12/11/2023   BUN 17 12/11/2023   CREATININE 0.62 12/11/2023   CALCIUM  9.2 12/11/2023   GFRNONAA >60 12/11/2023   GFRAA 68 09/26/2020  Lipid Panel:  Lab Results  Component Value Date   LDLCALC 40 12/26/2021   HgbA1c:  Lab Results  Component Value Date   HGBA1C 5.7 07/19/2021   Urine Drug Screen:     Component Value Date/Time   LABOPIA NONE DETECTED 02/16/2024 2048   COCAINSCRNUR NONE DETECTED 02/16/2024 2048   LABBENZ NONE DETECTED 02/16/2024 2048   AMPHETMU NONE DETECTED 02/16/2024 2048   THCU NONE DETECTED 02/16/2024 2048   LABBARB NONE DETECTED 02/16/2024 2048    Alcohol Level No results found for: "ETH" INR  Lab Results  Component Value Date   INR 1.0 05/02/2023   APTT  Lab Results  Component Value Date   APTT 30 05/02/2023   CT Head without contrast(Personally reviewed): Aspects 10.  CT angio Head and Neck with contrast(Personally reviewed): Not performed due to concern being more of a stroke mimic than stroke-taken for stat MRI  of the brain  MRI Brain(Personally reviewed): Preliminary review negative for acute stroke-formal read in agreement   EtOH came back at 162   ASSESSMENT   TALLY LAMARQUE is a 78 y.o. female past medical history of COPD, heart failure, alcohol abuse, fibromyalgia, breast cancer, pulmonary nodules, hyperlipidemia and hypertension brought in for evaluation of strokelike symptoms of slurred speech. On my examination, other than a very dysarthric speech, she does not have any other focal deficits. Differentials did include stroke given her risk factors that is why she was respiratory noncontrasted head MRI which was negative for acute stroke. In the interim, the EtOH levels came back-162. Her current dysarthria is likely related to alcohol intoxication.  RECOMMENDATIONS  No further inpatient neurological workup at this time Plan discussed with Dr. Leida Puna Please call with questions  ______________________________________________________________________    Signed, Tona Francis, MD [facility]

## 2024-02-16 NOTE — ED Provider Notes (Signed)
 Care assumed at 2330.  Patient presented to the emergency department as a code stroke due to speech changes.  Alcohol level was noted to be elevated.  Patient care transferred pending recheck and metabolization of alcohol.  She was found to be hypokalemic, this was replaced. Patient was observed in the emergency department for several hours.  She is found to be speaking normally on recheck.  She is able to ambulate with a steady gait.  Discussed findings of hypokalemia with patient as well as her son over the phone.  Feel she is stable for discharge with outpatient follow-up and return precautions.    Kelsey Patricia, MD 02/17/24 [phone]

## 2024-02-16 NOTE — Code Documentation (Signed)
 Responded to Code Stroke called at 2023 for L facial droop, L sided weakness, and slurred speech, LSN-1800. Pt arrived at 2039, CBG-73, NIH-4 for LOC questions, L left drift, and slurred speech, CT head negative for acute changes. Pt taken for STAT MRI at 2054. MRI negative for stroke. TNK not given-not a stroke.  Code Stroke cancelled at 2303.

## 2024-02-17 NOTE — Discharge Instructions (Signed)
 Please follow up with your family doctor in the next week for recheck.

## 2024-02-17 NOTE — ED Notes (Signed)
 Family notified by RN on patient's discharge .

## 2024-02-17 NOTE — ED Notes (Signed)
 Patient ambulated with minimal assistance , EDP notified .

## 2024-02-18 ENCOUNTER — Telehealth: Payer: Self-pay

## 2024-02-18 NOTE — Transitions of Care (Post Inpatient/ED Visit) (Signed)
 02/18/2024  Name: [name] MRN: [id number] DOB: [birth year]  Today's TOC FU Call Status: Today's TOC FU Call Status:: Successful TOC FU Call Completed TOC FU Call Complete Date: 02/18/24 Patient's Name and Date of Birth confirmed.  Transition Care Management Follow-up Telephone Call Date of Discharge: 02/17/24 Discharge Facility: [facility]) Type of Discharge: Emergency Department Reason for ED Visit: Other: (alcohol use) How have you been since you were released from the hospital?: Better Any questions or concerns?: No  Items Reviewed: Did you receive and understand the discharge instructions provided?: Yes Medications obtained,verified, and reconciled?: Yes (Medications Reviewed) Any new allergies since your discharge?: No Dietary orders reviewed?: NA Do you have support at home?: Yes People in Home [RPT]: spouse  Medications Reviewed Today: Medications Reviewed Today     Reviewed by Darrall Ellison, LPN (Licensed Practical Nurse) on 02/18/24 at 1501  Med List Status: <None>   Medication Order Taking? Sig Documenting Provider Last Dose Status Informant  acetaminophen  (ACETAMINOPHEN  8 HOUR) 650 MG CR tablet [id number] No Take 1,300 mg by mouth every 8 (eight) hours as needed for pain. [provider] 02/16/2024 Morning Active Self, Pharmacy Records  albuterol  (VENTOLIN  HFA) 108 (90 Base) MCG/ACT inhaler [id number] No INHALE TWO PUFFS BY MOUTH EVERY 6 HOURS AS NEEDED FOR WHEEZE OR SHORTNESS OF BREATH Young, Tereso Fenton, MD Past Week Active Self, Pharmacy Records  ammonium lactate (AMLACTIN DAILY) 12 % lotion [id number] No Apply 1 Application topically daily. [provider] 02/16/2024 Morning Active Self, Pharmacy Records  aspirin  EC 81 MG tablet [id number] No Take 1 tablet (81 mg total) by mouth daily. Abbe Abate, MD 02/16/2024 Morning Active Self, Pharmacy Records  atorvastatin  (LIPITOR ) 80 MG tablet [id number] No Take 1 tablet (80 mg total) by mouth daily.  [place], [name], MD 02/16/2024 Morning Active Self, Pharmacy Records  budesonide  (PULMICORT ) 0.5 MG/2ML nebulizer solution [id number] No Take 2 mLs (0.5 mg total) by nebulization 2 (two) times daily.  Patient taking differently: Take 0.5 mg by nebulization as needed (SOB, wheezing).   Faustina Hood, MD 02/15/2024 Active Self, Pharmacy Records  Carboxymethylcellulose Sodium (THERATEARS) 0.25 % SOLN [id number] No Place 2 drops into both eyes as needed (Dry eyes). [provider] 02/15/2024 Morning Active Self, Pharmacy Records  cilostazol  (PLETAL ) 100 MG tablet [id number] No TAKE 1 TABLET BY MOUTH TWICE  DAILY Marlyse Single T, PA-C 02/16/2024 Morning Active Self, Pharmacy Records  clobetasol (TEMOVATE) 0.05 % external solution [id number] No Apply 1 Application topically 2 (two) times daily. Apply to scalp for seborrhea. [provider] 02/16/2024 Morning Active Self, Pharmacy Records  clopidogrel  (PLAVIX ) 75 MG tablet [id number] No TAKE ONE TABLET BY MOUTH DAILY AT Uvaldo Garfinkel, MD 02/16/2024 Morning Active Self, Pharmacy Records  DUPIXENT  300 MG/2ML SOPN [id number] No Inject 300 mg into the skin every 14 (fourteen) days. [provider] Past Week Active Self, Pharmacy Records           Med Note Merlyn Starring, Adonna Alamo Feb 16, 2024 11:51 PM) Next injection due 02/29/24  ezetimibe  (ZETIA ) 10 MG tablet [id number] No Take 1 tablet (10 mg total) by mouth daily. [place], [name], MD 02/16/2024 Morning Active Self, Pharmacy Records  FEROSUL 325 [phone] Fe) MG tablet [id number] No TAKE ONE TABLET BY MOUTH DAILY AT 9AM WITH Emelie Hanger [place], [name], MD 02/16/2024 Morning Active Self, Pharmacy Records  fluticasone  (FLONASE ) 50 MCG/ACT nasal spray [id number] No SPRAY 1 SPRAY IN EACH NOSTRIL TWICE DAILY [place], Betty  G, MD 02/16/2024 Morning Active Self, Pharmacy Records  furosemide  (LASIX ) 40 MG tablet [id number] No TAKE ONE TABLET BY MOUTH TWICE DAILY [place], [name], MD 02/16/2024 Morning Active Self,  Pharmacy Records  hydrOXYzine  (ATARAX ) 25 MG tablet [id number] No Take 25 mg by mouth at bedtime. [provider] 02/15/2024 Bedtime Active Self, Pharmacy Records  ipratropium-albuterol  (DUONEB) 0.5-2.5 (3) MG/3ML SOLN [id number] No Inhale 3 mLs into the lungs as directed. TID for 1 week and then use as needed Q6 hours.  Patient taking differently: Inhale 3 mLs into the lungs every 6 (six) hours as needed (wheezing/SOB).   Rosa College D, MD 02/16/2024 Morning Active Self, Pharmacy Records  levothyroxine  (SYNTHROID ) 112 MCG tablet [id number] No TAKE 1/2 TABLET BY MOUTH TUESDAY AND THURSDAY and TAKE ONE TABLET BY MOUTH ON MONDAY, WEDNESDAY, FRIDAY, SATURDAY & Peggyann Bower [place], [name], MD 02/16/2024 Morning Active Self, Pharmacy Records  LORazepam  (ATIVAN ) 2 MG tablet [id number] No TAKE ONE TABLET (2 MG TOTAL) BY MOUTH DAILY AT BEDTIME [place], [name], MD 02/15/2024 Bedtime Active Self, Pharmacy Records  montelukast  (SINGULAIR ) 10 MG tablet [id number] No TAKE 1 TABLET BY MOUTH EVERYDAY AT BEDTIME Antonio Baumgarten, NP 02/15/2024 Bedtime Active Self, Pharmacy Records  pantoprazole  (PROTONIX ) 20 MG tablet [id number] No Take 1 tablet (20 mg total) by mouth 2 (two) times daily before a meal. [place], [name], MD 02/16/2024 Morning Active Self, Pharmacy Records  PARoxetine  (PAXIL ) 20 MG tablet [id number] No TAKE ONE TABLET BY MOUTH DAILY AT 9AM [place], [name], MD 02/16/2024 Morning Active Self, Pharmacy Records  potassium chloride  SA (KLOR-CON  M) 20 MEQ tablet [id number] No TAKE 1 TABLET BY MOUTH DAILY [place], [name], MD 02/16/2024 Morning Active Self, Pharmacy Records  tamsulosin  (FLOMAX ) 0.4 MG CAPS capsule [id number] No TAKE 1 CAPSULE BY MOUTH DAILY [place], [name], MD 02/16/2024 Morning Active Self, Pharmacy Records  traZODone  (DESYREL ) 100 MG tablet [id number] No TAKE ONE TABLET BY MOUTH DAILY AT 9PM AT BEDTIME [place], [name], MD 02/15/2024 Bedtime Active Self, Pharmacy Records  TRELEGY ELLIPTA  200-62.5-25 MCG/ACT AEPB  [id number] No INHALE 1 PUFF BY MOUTH ONCE DAILY (RINSE MOUTH AFTER EACH USE) Saturnino Curia) Faustina Hood, MD 02/16/2024 Morning Active Self, Pharmacy Records  urea (CARMOL) 40 % CREA [id number] No Apply 1 Application topically daily as needed (eczema). [provider] 02/16/2024 Morning Active Self, Pharmacy Records            Home Care and Equipment/Supplies: Were Home Health Services Ordered?: NA Any new equipment or medical supplies ordered?: NA  Functional Questionnaire: Do you need assistance with bathing/showering or dressing?: No Do you need assistance with meal preparation?: No Do you need assistance with eating?: No Do you have difficulty maintaining continence: No Do you need assistance with getting out of bed/getting out of a chair/moving?: No Do you have difficulty managing or taking your medications?: No  Follow up appointments reviewed: PCP Follow-up appointment confirmed?: No (declined) MD Provider Line Number:[phone] Given: No Specialist Hospital Follow-up appointment confirmed?: NA Do you need transportation to your follow-up appointment?: No Do you understand care options if your condition(s) worsen?: Yes-patient verbalized understanding    SIGNATURE Darrall Ellison, LPN [facility] Nurse Health Advisor Direct Dial [phone]

## 2024-02-20 DIAGNOSIS — J449 Chronic obstructive pulmonary disease, unspecified: Secondary | ICD-10-CM | POA: Diagnosis not present

## 2024-02-24 ENCOUNTER — Ambulatory Visit: Payer: Self-pay

## 2024-02-24 ENCOUNTER — Other Ambulatory Visit: Payer: Self-pay | Admitting: Physician Assistant

## 2024-02-24 DIAGNOSIS — L570 Actinic keratosis: Secondary | ICD-10-CM | POA: Diagnosis not present

## 2024-02-24 DIAGNOSIS — Z8551 Personal history of malignant neoplasm of bladder: Secondary | ICD-10-CM | POA: Diagnosis not present

## 2024-02-24 DIAGNOSIS — L578 Other skin changes due to chronic exposure to nonionizing radiation: Secondary | ICD-10-CM | POA: Diagnosis not present

## 2024-02-24 DIAGNOSIS — L219 Seborrheic dermatitis, unspecified: Secondary | ICD-10-CM | POA: Diagnosis not present

## 2024-02-24 DIAGNOSIS — R58 Hemorrhage, not elsewhere classified: Secondary | ICD-10-CM | POA: Diagnosis not present

## 2024-02-24 DIAGNOSIS — L2084 Intrinsic (allergic) eczema: Secondary | ICD-10-CM | POA: Diagnosis not present

## 2024-02-24 DIAGNOSIS — L723 Sebaceous cyst: Secondary | ICD-10-CM | POA: Diagnosis not present

## 2024-02-24 DIAGNOSIS — I739 Peripheral vascular disease, unspecified: Secondary | ICD-10-CM

## 2024-02-24 DIAGNOSIS — Z79899 Other long term (current) drug therapy: Secondary | ICD-10-CM | POA: Diagnosis not present

## 2024-02-24 NOTE — Telephone Encounter (Signed)
  Chief Complaint: Skin problem Symptoms: redness and swelling to area of left arm the size of a quarter Frequency: occurred 02/16/2024 Pertinent Negatives: Patient denies CP, SOB, fever Disposition: [] ED /[] Urgent Care (no appt availability in office) / [x] Appointment(In office/virtual)/ []  [facility]/ [] Home Care/ [] Refused Recommended Disposition /[] [facility]/ []  Follow-up with PCP Additional Notes: patient calling with concerns for an area of swelling and redness to her left arm from 02/16/2024. Patient was present in the ED at this time. Patient reports the area isn't draining but has significant swelling and redness. Patient states the area is painful and goes down into her wrist. Per protocol patient is recommended to be seen in 24 hours. Patient is needing to be scheduled for a hospital follow up visit. Appointment scheduled for tomorrow for hospital follow up at 2:00 PM with PCP. Patient is asking for her vitamin B 12 shot that is scheduled for 02/26/2024 to be given tomorrow during her appointment. Explained this would be up to the office. Patient verbalized understanding and all questions answered.     Copied from CRM [id number]. Topic: Clinical - Red Word Triage >> Feb 24, 2024 12:18 PM Chuck Crater wrote: Red Word that prompted transfer to Nurse Triage: Patient is having problems with her arm. She has a knot where her blood was drawn and swollen all around.It hurts all the way down to her wrist. Patient stated stated that she had her blood drawn and stated that it started to hurt immediately after. Reason for Disposition  [1] Looks infected (spreading redness, pus) AND [2] no fever  Answer Assessment - Initial Assessment Questions 1. APPEARANCE of INJURY: "What does the injury look like?"      Red with swollen 2. SIZE: "How large is the cut?"      Size of a quarter 3. BLEEDING: "Is it bleeding now?" If Yes, ask: "Is it difficult to stop?"      no 4. LOCATION:  "Where is the injury located?"      Left arm 5. ONSET: "How long ago did the injury occur?"      Occurred on 5/4 6. MECHANISM: "Tell me how it happened."      IV placed and area developed with hardened knot and its swollen  Protocols used: Skin Injury-A-AH

## 2024-02-25 ENCOUNTER — Encounter: Payer: Self-pay | Admitting: Family Medicine

## 2024-02-25 ENCOUNTER — Ambulatory Visit (INDEPENDENT_AMBULATORY_CARE_PROVIDER_SITE_OTHER): Admitting: Family Medicine

## 2024-02-25 ENCOUNTER — Ambulatory Visit: Payer: Self-pay | Admitting: Family Medicine

## 2024-02-25 VITALS — BP 126/70 | HR 76 | Resp 16 | Ht 65.0 in | Wt 131.0 lb

## 2024-02-25 DIAGNOSIS — E785 Hyperlipidemia, unspecified: Secondary | ICD-10-CM

## 2024-02-25 DIAGNOSIS — I10 Essential (primary) hypertension: Secondary | ICD-10-CM | POA: Diagnosis not present

## 2024-02-25 DIAGNOSIS — F419 Anxiety disorder, unspecified: Secondary | ICD-10-CM | POA: Diagnosis not present

## 2024-02-25 DIAGNOSIS — E038 Other specified hypothyroidism: Secondary | ICD-10-CM

## 2024-02-25 DIAGNOSIS — E538 Deficiency of other specified B group vitamins: Secondary | ICD-10-CM

## 2024-02-25 DIAGNOSIS — R4781 Slurred speech: Secondary | ICD-10-CM

## 2024-02-25 DIAGNOSIS — E876 Hypokalemia: Secondary | ICD-10-CM

## 2024-02-25 LAB — HEPATIC FUNCTION PANEL
ALT: 28 U/L (ref 0–35)
AST: 29 U/L (ref 0–37)
Albumin: 4.4 g/dL (ref 3.5–5.2)
Alkaline Phosphatase: 118 U/L — ABNORMAL HIGH (ref 39–117)
Bilirubin, Direct: 0.1 mg/dL (ref 0.0–0.3)
Total Bilirubin: 0.5 mg/dL (ref 0.2–1.2)
Total Protein: 7.8 g/dL (ref 6.0–8.3)

## 2024-02-25 LAB — T4, FREE: Free T4: 0.83 ng/dL (ref 0.60–1.60)

## 2024-02-25 LAB — BASIC METABOLIC PANEL WITH GFR
BUN: 18 mg/dL (ref 6–23)
CO2: 30 meq/L (ref 19–32)
Calcium: 9.4 mg/dL (ref 8.4–10.5)
Chloride: 97 meq/L (ref 96–112)
Creatinine, Ser: 0.88 mg/dL (ref 0.40–1.20)
GFR: 62.57 mL/min (ref >60.00)
Glucose, Bld: 90 mg/dL (ref 70–99)
Potassium: 3.8 meq/L (ref 3.5–5.1)
Sodium: 136 meq/L (ref 135–145)

## 2024-02-25 LAB — TSH: TSH: 17.02 u[IU]/mL — ABNORMAL HIGH (ref 0.35–5.50)

## 2024-02-25 LAB — VITAMIN B12: Vitamin B-12: >1500 pg/mL — ABNORMAL HIGH (ref 211–911)

## 2024-02-25 MED ORDER — CYANOCOBALAMIN 1000 MCG/ML IJ SOLN
1000.0000 ug | Freq: Once | INTRAMUSCULAR | Status: AC
  Administered 2024-02-25: 1000 ug via INTRAMUSCULAR

## 2024-02-25 NOTE — Progress Notes (Signed)
 ACUTE VISIT Chief Complaint  Patient presents with   Hospitalization Follow-up   Arm Pain   HPI: [name] is a 78 y.o. female with a PMHx significant for CHF, CAD, PAD, severe COPD, insomnia, hypothyroidism, bladder cancer, breast cancer, anxiety, depression, prediabetes, and HLD, who is here today for hospital follow up and arm pain.   Patient presented to the ED on 5/4 via EMS as a code stroke due to episode of whole body stiffness and not able to talk, slurred speech; so her husband called 911. No associated bowel/bladder incontinence, shaking,or tongue biting.  Alcohol level was noted to be elevated, 162.  She was found to be hypokalemic, this was replaced.  She was observed in the emergency department for several hours.  One she was back to her baseline she was discharged home. States that she does not recall EMS evaluation at home and some parts of ER evaluation.  She mentions today that she had had 3 beers as well as a homemade cough syrup made form rye whiskey and rock candy the day of   Anxiety and insomnia: She takes Lorazepam  2 mg at bedtime every night and Paroxetine  20 mg daily for anxiety. Trazodone  100 mg at bedtime for insomnia. UTS negative for benzodiazepines.  Hypokalemia:  She has been taking an extra potassium pill every day since ER visit. Currently taking furosemide  40 mg twice daily.   Last echo 07/2021 LVEF 55-60%. BP mildly elevated in the ED, 147/75. She is on non pharmacologic treatment.  Lab Results  Component Value Date   NA 138 02/16/2024   NA 137 02/16/2024   CL 101 02/16/2024   CL 101 02/16/2024   K 3.1 (L) 02/16/2024   K 2.8 (L) 02/16/2024   CO2 23 02/16/2024   BUN 19 02/16/2024   BUN 16 02/16/2024   CREATININE 1.00 02/16/2024   CREATININE 0.83 02/16/2024   GFRNONAA >60 02/16/2024   CALCIUM  8.6 (L) 02/16/2024   PHOS 4.0 11/07/2021   ALBUMIN  3.6 02/16/2024   GLUCOSE 235 (H) 02/16/2024   GLUCOSE 232 (H) 02/16/2024    Hypothyroidism: Currently on levothyroxine  56 mcg on Tuesday and Thursday and 112 mcg on other days of the week.  Lab Results  Component Value Date   TSH 0.538 05/02/2023   COPD and OSA:  Negative for worsening cough,wheezing,or DOE. She had a pulmonology appointment a few weeks ago, and her pulmonologist is trying to have her fitted for a new CPAP. She has not been using her current one for a few weeks.  Currently on Duoneb, Pulmicort , and Trelegy Ellipta .   Arm pain:  Patient also complains of left forearm pain on the venepuncture area. She says her arm has been itching and burning since she had blood drawn in the hospital on 5/4. No localized erythema or limitation of elbow ROM.   Review of Systems  Constitutional:  Positive for fatigue. Negative for appetite change and fever.  HENT:  Negative for mouth sores, nosebleeds and sore throat.   Eyes:  Negative for redness and visual disturbance.  Cardiovascular:  Negative for chest pain and leg swelling.  Gastrointestinal:  Negative for abdominal pain, nausea and vomiting.  Endocrine: Negative for cold intolerance and heat intolerance.  Genitourinary:  Negative for decreased urine volume, dysuria and hematuria.  Musculoskeletal:  Positive for arthralgias and gait problem.  Skin:  Negative for rash.  Neurological:  Negative for syncope, weakness and headaches.  Psychiatric/Behavioral:  Negative for confusion and hallucinations.  See other pertinent positives and negatives in HPI.  Current Outpatient Medications on File Prior to Visit  Medication Sig Dispense Refill   acetaminophen  (ACETAMINOPHEN  8 HOUR) 650 MG CR tablet Take 1,300 mg by mouth every 8 (eight) hours as needed for pain.     albuterol  (VENTOLIN  HFA) 108 (90 Base) MCG/ACT inhaler INHALE TWO PUFFS BY MOUTH EVERY 6 HOURS AS NEEDED FOR WHEEZE OR SHORTNESS OF BREATH 18 g 7   ammonium lactate (AMLACTIN DAILY) 12 % lotion Apply 1 Application topically daily.     aspirin  EC 81  MG tablet Take 1 tablet (81 mg total) by mouth daily. 30 tablet    atorvastatin  (LIPITOR ) 80 MG tablet Take 1 tablet (80 mg total) by mouth daily. 90 tablet 3   budesonide  (PULMICORT ) 0.5 MG/2ML nebulizer solution Take 2 mLs (0.5 mg total) by nebulization 2 (two) times daily. (Patient taking differently: Take 0.5 mg by nebulization as needed (SOB, wheezing).) 20 mL 12   Carboxymethylcellulose Sodium (THERATEARS) 0.25 % SOLN Place 2 drops into both eyes as needed (Dry eyes).     cilostazol  (PLETAL ) 100 MG tablet TAKE 1 TABLET BY MOUTH TWICE  DAILY 30 tablet 0   clobetasol (TEMOVATE) 0.05 % external solution Apply 1 Application topically 2 (two) times daily. Apply to scalp for seborrhea.     clopidogrel  (PLAVIX ) 75 MG tablet TAKE ONE TABLET BY MOUTH DAILY AT 9AM 90 tablet 5   DUPIXENT  300 MG/2ML SOPN Inject 300 mg into the skin every 14 (fourteen) days.     ezetimibe  (ZETIA ) 10 MG tablet Take 1 tablet (10 mg total) by mouth daily. 90 tablet 1   FEROSUL 325 (65 Fe) MG tablet TAKE ONE TABLET BY MOUTH DAILY AT 9AM WITH BREAKFAST 90 tablet 2   fluticasone  (FLONASE ) 50 MCG/ACT nasal spray SPRAY 1 SPRAY IN EACH NOSTRIL TWICE DAILY 48 g 2   furosemide  (LASIX ) 40 MG tablet TAKE ONE TABLET BY MOUTH TWICE DAILY 180 tablet 2   hydrOXYzine  (ATARAX ) 25 MG tablet Take 25 mg by mouth at bedtime.     ipratropium-albuterol  (DUONEB) 0.5-2.5 (3) MG/3ML SOLN Inhale 3 mLs into the lungs as directed. TID for 1 week and then use as needed Q6 hours. (Patient taking differently: Inhale 3 mLs into the lungs every 6 (six) hours as needed (wheezing/SOB).) 360 mL 4   levothyroxine  (SYNTHROID ) 112 MCG tablet TAKE 1/2 TABLET BY MOUTH TUESDAY AND THURSDAY and TAKE ONE TABLET BY MOUTH ON MONDAY, WEDNESDAY, FRIDAY, SATURDAY & SUNDAY 86 tablet 11   LORazepam  (ATIVAN ) 2 MG tablet TAKE ONE TABLET (2 MG TOTAL) BY MOUTH DAILY AT BEDTIME 30 tablet 3   montelukast  (SINGULAIR ) 10 MG tablet TAKE 1 TABLET BY MOUTH EVERYDAY AT BEDTIME 90 tablet 1    pantoprazole  (PROTONIX ) 20 MG tablet Take 1 tablet (20 mg total) by mouth 2 (two) times daily before a meal. 180 tablet 3   PARoxetine  (PAXIL ) 20 MG tablet TAKE ONE TABLET BY MOUTH DAILY AT 9AM 90 tablet 2   potassium chloride  SA (KLOR-CON  M) 20 MEQ tablet TAKE 1 TABLET BY MOUTH DAILY 100 tablet 2   tamsulosin  (FLOMAX ) 0.4 MG CAPS capsule TAKE 1 CAPSULE BY MOUTH DAILY 100 capsule 2   traZODone  (DESYREL ) 100 MG tablet TAKE ONE TABLET BY MOUTH DAILY AT 9PM AT BEDTIME 90 tablet 2   TRELEGY ELLIPTA  200-62.5-25 MCG/ACT AEPB INHALE 1 PUFF BY MOUTH ONCE DAILY (RINSE MOUTH AFTER EACH USE) (BULK) 60 each 11   urea (CARMOL) 40 %  CREA Apply 1 Application topically daily as needed (eczema).     No current facility-administered medications on file prior to visit.   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)    05/ 2018  chronic non-occlusive DVT RLE   Chronic kidney disease    COPD, frequent exacerbations (HCC)    03-06-2018 per pt last exacerbation 12/ 2018   Coronary artery disease    cardiologist-  dr Mitzie Anda-- 03-11-2018 er cath , 80% stenosis in the ostial second diagonal   Dyspnea on minimal exertion    Emphysema/COPD (HCC)    CAT score- 17   Family history of breast cancer    Family history of colon cancer    Family history of melanoma    Family history of ovarian cancer    Family history of pancreatic cancer    Fibromyalgia 1995   GERD (gastroesophageal reflux disease)    Hiatal hernia    History of breast cancer    History of diverticulitis of colon    2002  s/p  sigmoid colectomy   History of multiple pulmonary nodules    hx RUL nodules x2  s/p right VATS w/ wedge resection 09-11-2005 and 06-14-2011  both  necrotizing granulomatous inflammation w/ cystic area of necrosis & focal calcification   History of right breast  cancer oncologist-  dr Charolett Copes-- no recurrence   dx 2010--  IDC, Stage IA , ER/PR+,  (pT1c pN0) 11-09-2008 right lumpectomy;  12-18-2008 right simple mastectomy for DCIS margins;  completed chemotherapy 2010 (no radiation) and completed antiestrogen therapy   Hx of colonic polyps    Dr. 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   Nocturia    OA (osteoarthritis)    thumbs   On supplemental oxygen  therapy    03-06-2018 per pt uses only at night,  checks O2 sats at home,  stated am sat 89% after moving around average 93-94% with RA   Osteoporosis    Peripheral arterial occlusive disease (HCC) vascular-- dr chen/ dr Vikki Graves   proximal right SFA severe focal stenosis with collaterals from the PFA/  04/ 2012 occluded celiac and SMA arteries with distal reconstitution w/ patent IMA   Peripheral vascular disease (HCC)    chronic DVT RLE,  mesenteric vascular disease   Pneumonia    Right lower lobe pulmonary nodule    Chest CT 08-29-2017   Sleep apnea    Stage 3 severe COPD by GOLD classification (HCC) hx frequent exacerbations--   pulmologist-  dr Jennie Moeller--  per lov note , dated 12-20-2017, oxyogen 2L is prescribed for use with exertion (but pt only uses mostly at night), O2 sats on RA run the 80s , this day sat 86% RA and with 2L O2 sat 92%   Varicose veins of leg with swelling    varicose vein surgery - Dr. Arvilla Laud   Wears glasses    Wears hearing aid in both ears    Allergies  Allergen Reactions   Sinequan  [Doxepin ] Other (See Comments)    Kept her awake    Sulfa Antibiotics Swelling   Sulfur     Other reaction(s): Unknown   Social History  Socioeconomic History   Marital status: Married    Spouse name: Not on file   Number of children: 3   Years of education: 83   Highest education level: Not on file  Occupational History   Occupation: Magazine features editor:  RETIRED    Comment: retired  Tobacco Use   Smoking status: Former    Current packs/day: 0.00    Average packs/day: 0.3 packs/day for 50.0 years (12.5 ttl pk-yrs)    Types: Cigarettes    Start date: 05/18/1966    Quit date: 05/18/2016    Years since quitting: 7.7   Smokeless tobacco: Never  Vaping Use   Vaping status: Never Used  Substance and Sexual Activity   Alcohol use: Yes    Alcohol/week: 14.0 standard drinks of alcohol    Types: 14 Cans of beer per week    Comment: every night   Drug use: No   Sexual activity: Not on file  Other Topics Concern   Not on file  Social History Narrative   ** Merged History Encounter **       HSG, beauty school. Married '69 -7 yrs/widowed; married '79 - 66yr/divorced; married '87.  2 sons - '70, '74, 1 srtep-son; 1 grandchild. 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 SO - good health.  NO history of abuse.  ACP - full code and all heroic measures.    Social Drivers of Corporate investment banker Strain: Low Risk  (02/25/2023)   Overall Financial Resource Strain (CARDIA)    Difficulty of Paying Living Expenses: Not hard at all  Food Insecurity: No Food Insecurity (12/13/2023)   Hunger Vital Sign    Worried About Running Out of Food in the Last Year: Never true    Ran Out of Food in the Last Year: Never true  Transportation Needs: No Transportation Needs (12/13/2023)   PRAPARE - Administrator, Civil Service (Medical): No    Lack of Transportation (Non-Medical): No  Physical Activity: Inactive (02/25/2023)   Exercise Vital Sign    Days of Exercise per Week: 0 days    Minutes of Exercise per Session: 0 min  Stress: No Stress Concern Present (02/25/2023)   [employer] of Occupational Health - Occupational Stress Questionnaire    Feeling of Stress : Not at all  Social Connections: Moderately Isolated (12/09/2023)   Social Connection and Isolation Panel [NHANES]    Frequency of Communication with Friends and Family: More than three times a  week    Frequency of Social Gatherings with Friends and Family: More than three times a week    Attends Religious Services: Never    Database administrator or Organizations: No    Attends Banker Meetings: Never    Marital Status: Married   Vitals:   02/25/24 1343  BP: 126/70  Pulse: 76  Resp: 16  SpO2: 92%   Body mass index is 21.8 kg/m.  Physical Exam Vitals and nursing note reviewed.  Constitutional:      General: She is not in acute distress.    Appearance: She is well-developed.  HENT:     Head: Normocephalic and atraumatic.     Mouth/Throat:     Mouth: Mucous membranes are moist.  Eyes:     Conjunctiva/sclera: Conjunctivae normal.  Cardiovascular:     Rate and Rhythm: Normal rate and regular rhythm.     Heart sounds: No murmur heard.    Comments: DP pulses palpable. Pulmonary:  Effort: Pulmonary effort is normal. No respiratory distress.     Breath sounds: Normal breath sounds.  Abdominal:     Palpations: Abdomen is soft. There is no mass.     Tenderness: There is no abdominal tenderness.  Musculoskeletal:     Right lower leg: No edema.     Left lower leg: No edema.  Skin:    General: Skin is warm.     Findings: No erythema or rash.       Neurological:     General: No focal deficit present.     Mental Status: She is alert and oriented to person, place, and time.     Comments: Mildly unstable gait, not assisted.  Psychiatric:        Mood and Affect: Mood and affect normal.    ASSESSMENT AND PLAN:  Ms. Sniff was seen today for hospital ED follow up.  Lab Results  Component Value Date   TSH 17.02 (H) 02/25/2024   Lab Results  Component Value Date   VITAMINB12 >1500 (H) 02/25/2024   Lab Results  Component Value Date   ALT 28 02/25/2024   AST 29 02/25/2024   ALKPHOS 118 (H) 02/25/2024   BILITOT 0.5 02/25/2024   Lab Results  Component Value Date   NA 136 02/25/2024   CL 97 02/25/2024   K 3.8 02/25/2024   CO2 30 02/25/2024    BUN 18 02/25/2024   CREATININE 0.88 02/25/2024   GFR 62.57 02/25/2024   CALCIUM  9.4 02/25/2024   PHOS 4.0 11/07/2021   ALBUMIN  4.4 02/25/2024   GLUCOSE 90 02/25/2024   Hypokalemia Assessment & Plan: Continue KLOR 20 meq daily. Some side effects of diuretics discussed, decreased dose from 2 tabs daily to one tab daily and second dose every other day. Further recommendations according to BMP.  Orders: -     Basic metabolic panel with GFR; Future  Essential hypertension Assessment & Plan: BP adequately controlled today. Continue non pharmacologic treatment.  Other specified hypothyroidism Assessment & Plan: Last TSH 0.53 in 04/2023. Continue Levothyroxine  112 mcg daily x 5 d, 1/2 tab Thursdays and Tuesdays. Further recommendations according to TSH result.  Orders: -     T4, free -     TSH  Anxiety disorder, unspecified type Assessment & Plan: Urine tox in the ER negative for benzodiazepines. Reports that she is taking Lorazepam  as instructed. Continue Lorazepam  2 mg daily and Paroxetine  20 mg daily.  B12 deficiency Assessment & Plan: After verbal consent she received B12 1000 mcg IM today. Continue B12 1000 mcg IM q 4-5 weeks.   Orders: -     Vitamin B12 -     Cyanocobalamin   Slurred speech With associated extremities "stiffness." She is back to her baseline. Brain MRI negative for acute intracranial abnormality.  Possible causes discussed, including TIA. Dx'ed with alcohol intoxication. We discussed risk with high alcohol intake + some of medications she takes. Strongly recommend decreasing alcohol intake.  -COPD: Following with pulmonologist. -OSA: Encouraged CPAP use.  In regard to pain in area of venipuncture, recommend continuing monitoring for changes.  ROM elbow exercises and local massage may help.  Return if symptoms worsen or fail to improve, for keep next appointment.  IFritz Jewel Wierda, acting as a scribe for Chole Driver [place], MD., have  documented all relevant documentation on the behalf of Kiren Mcisaac [place], MD, as directed by  Kristianne Albin [place], MD while in the presence of Shin Lamour [place], MD.   I, Jhonatan Lomeli [place], MD, have  reviewed all documentation for this visit. The documentation on 02/25/24 for the exam, diagnosis, procedures, and orders are all accurate and complete.  Keyandre Pileggi G. [place], MD  [facility]. [place] office.

## 2024-02-25 NOTE — Assessment & Plan Note (Signed)
BP adequately controlled today. Continue nonpharmacologic treatment.

## 2024-02-25 NOTE — Telephone Encounter (Signed)
 Former pt of Dr. Ardell Beauvais. Last OV was in 07/2022. 59mo Followup. No appts and passed 3 attempts. Please advise on this refill.

## 2024-02-25 NOTE — Assessment & Plan Note (Addendum)
 Urine tox in the ER negative for benzodiazepines. Reports that she is taking Lorazepam  as instructed. Continue Lorazepam  2 mg daily and Paroxetine  20 mg daily.

## 2024-02-25 NOTE — Assessment & Plan Note (Signed)
 After verbal consent she received B12 1000 mcg IM today. Continue B12 1000 mcg IM q 4-5 weeks.

## 2024-02-25 NOTE — Patient Instructions (Addendum)
 A few things to remember from today's visit:  Hypokalemia - Plan: Basic metabolic panel with GFR  Anxiety disorder, unspecified type  Essential hypertension  Change Furosemide  to one tab daily and the second dose every other day. No changes in rest. Stop whisky intake.  If you need refills for medications you take chronically, please call your pharmacy. Do not use My Chart to request refills or for acute issues that need immediate attention. If you send a my chart message, it may take a few days to be addressed, specially if I am not in the office.  Please be sure medication list is accurate. If a new problem present, please set up appointment sooner than planned today.

## 2024-02-25 NOTE — Assessment & Plan Note (Addendum)
 Continue KLOR 20 meq daily. Some side effects of diuretics discussed, decreased dose from 2 tabs daily to one tab daily and second dose every other day. Further recommendations according to BMP.

## 2024-02-25 NOTE — Assessment & Plan Note (Signed)
 Last TSH 0.53 in 04/2023. Continue Levothyroxine 112 mcg daily x 5 d, 1/2 tab Thursdays and Tuesdays.  Further recommendations according to TSH result.

## 2024-02-26 ENCOUNTER — Ambulatory Visit

## 2024-03-04 ENCOUNTER — Ambulatory Visit: Payer: 59

## 2024-03-04 VITALS — Ht 65.0 in | Wt 131.0 lb

## 2024-03-04 DIAGNOSIS — Z0000 Encounter for general adult medical examination without abnormal findings: Secondary | ICD-10-CM

## 2024-03-04 NOTE — Progress Notes (Addendum)
 Subjective:   [name] is a 78 y.o. who presents for a Medicare Wellness preventive visit.  As a reminder, Annual Wellness Visits don't include a physical exam, and some assessments may be limited, especially if this visit is performed virtually. We may recommend an in-person follow-up visit with your provider if needed.  Visit Complete: Virtual I connected with  [name] on 03/16/24 by a audio enabled telemedicine application and verified that I am speaking with the correct person using two identifiers.  Patient Location: Home  Provider Location: Home Office  I discussed the limitations of evaluation and management by telemedicine. The patient expressed understanding and agreed to proceed.  Vital Signs: Because this visit was a virtual/telehealth visit, some criteria may be missing or patient reported. Any vitals not documented were not able to be obtained and vitals that have been documented are patient reported.    Persons Participating in Visit: Patient.  AWV Questionnaire: No: Patient Medicare AWV questionnaire was not completed prior to this visit.  Cardiac Risk Factors include: advanced age (>96men, >58 women);hypertension     Objective:     Today's Vitals   03/04/24 0844  Weight: 131 lb (59.4 kg)  Height: 5\' 5"  (1.651 m)  PainSc: 0-No pain   Body mass index is 21.8 kg/m.     03/04/2024    8:55 AM 12/08/2023    3:15 PM 05/01/2023    9:55 PM 02/25/2023    9:26 AM 10/18/2022    1:14 PM 01/08/2022    2:09 PM 11/01/2021   10:31 PM  Advanced Directives  Does Patient Have a Medical Advance Directive? Yes No Yes Yes Yes Yes No  Type of Estate agent of [place];Living will  Healthcare Power of [place];Living will Healthcare Power of [place];Living will Healthcare Power of [place];Living will Healthcare Power of [place];Living will   Does patient want to make changes to medical advance directive?   No - Patient declined      Copy of  Healthcare Power of Attorney in Chart? No - copy requested  No - copy requested No - copy requested No - copy requested No - copy requested   Would patient like information on creating a medical advance directive?  No - Patient declined     No - Patient declined    Current Medications (verified) Outpatient Encounter Medications as of 03/04/2024  Medication Sig   acetaminophen  (ACETAMINOPHEN  8 HOUR) 650 MG CR tablet Take 1,300 mg by mouth every 8 (eight) hours as needed for pain.   albuterol  (VENTOLIN  HFA) 108 (90 Base) MCG/ACT inhaler INHALE TWO PUFFS BY MOUTH EVERY 6 HOURS AS NEEDED FOR WHEEZE OR SHORTNESS OF BREATH   ammonium lactate (AMLACTIN DAILY) 12 % lotion Apply 1 Application topically daily.   aspirin  EC 81 MG tablet Take 1 tablet (81 mg total) by mouth daily.   atorvastatin  (LIPITOR ) 80 MG tablet Take 1 tablet (80 mg total) by mouth daily.   budesonide  (PULMICORT ) 0.5 MG/2ML nebulizer solution Take 2 mLs (0.5 mg total) by nebulization 2 (two) times daily. (Patient taking differently: Take 0.5 mg by nebulization as needed (SOB, wheezing).)   Carboxymethylcellulose Sodium (THERATEARS) 0.25 % SOLN Place 2 drops into both eyes as needed (Dry eyes).   cilostazol  (PLETAL ) 100 MG tablet TAKE 1 TABLET BY MOUTH TWICE  DAILY   clobetasol (TEMOVATE) 0.05 % external solution Apply 1 Application topically 2 (two) times daily. Apply to scalp for seborrhea.   clopidogrel  (PLAVIX ) 75 MG tablet  TAKE ONE TABLET BY MOUTH DAILY AT 9AM   DUPIXENT  300 MG/2ML SOPN Inject 300 mg into the skin every 14 (fourteen) days.   ezetimibe  (ZETIA ) 10 MG tablet Take 1 tablet (10 mg total) by mouth daily.   FEROSUL 325 (65 Fe) MG tablet TAKE ONE TABLET BY MOUTH DAILY AT 9AM WITH BREAKFAST   fluticasone  (FLONASE ) 50 MCG/ACT nasal spray SPRAY 1 SPRAY IN EACH NOSTRIL TWICE DAILY   furosemide  (LASIX ) 40 MG tablet TAKE ONE TABLET BY MOUTH TWICE DAILY   hydrOXYzine  (ATARAX ) 25 MG tablet Take 25 mg by mouth at bedtime.    ipratropium-albuterol  (DUONEB) 0.5-2.5 (3) MG/3ML SOLN Inhale 3 mLs into the lungs as directed. TID for 1 week and then use as needed Q6 hours. (Patient taking differently: Inhale 3 mLs into the lungs every 6 (six) hours as needed (wheezing/SOB).)   levothyroxine  (SYNTHROID ) 112 MCG tablet TAKE 1/2 TABLET BY MOUTH TUESDAY AND THURSDAY and TAKE ONE TABLET BY MOUTH ON MONDAY, WEDNESDAY, FRIDAY, SATURDAY & SUNDAY   LORazepam  (ATIVAN ) 2 MG tablet TAKE ONE TABLET (2 MG TOTAL) BY MOUTH DAILY AT BEDTIME   montelukast  (SINGULAIR ) 10 MG tablet TAKE 1 TABLET BY MOUTH EVERYDAY AT BEDTIME   PARoxetine  (PAXIL ) 20 MG tablet TAKE ONE TABLET BY MOUTH DAILY AT 9AM   potassium chloride  SA (KLOR-CON  M) 20 MEQ tablet TAKE 1 TABLET BY MOUTH DAILY   tamsulosin  (FLOMAX ) 0.4 MG CAPS capsule TAKE 1 CAPSULE BY MOUTH DAILY   traZODone  (DESYREL ) 100 MG tablet TAKE ONE TABLET BY MOUTH DAILY AT 9PM AT BEDTIME   TRELEGY ELLIPTA  200-62.5-25 MCG/ACT AEPB INHALE 1 PUFF BY MOUTH ONCE DAILY (RINSE MOUTH AFTER EACH USE) (BULK)   urea (CARMOL) 40 % CREA Apply 1 Application topically daily as needed (eczema).   [DISCONTINUED] pantoprazole  (PROTONIX ) 20 MG tablet Take 1 tablet (20 mg total) by mouth 2 (two) times daily before a meal.   No facility-administered encounter medications on file as of 03/04/2024.    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)    05/ 2018  chronic non-occlusive DVT RLE   Chronic kidney disease    COPD, frequent exacerbations (HCC)    03-06-2018 per pt last exacerbation 12/ 2018   Coronary artery disease    cardiologist-  dr Mitzie Anda-- 03-11-2018 er cath , 80% stenosis in the ostial second diagonal   Dyspnea on minimal exertion    Emphysema/COPD (HCC)    CAT  score- 17   Family history of breast cancer    Family history of colon cancer    Family history of melanoma    Family history of ovarian cancer    Family history of pancreatic cancer    Fibromyalgia 1995   GERD (gastroesophageal reflux disease)    Hiatal hernia    History of breast cancer    History of diverticulitis of colon    2002  s/p  sigmoid colectomy   History of multiple pulmonary nodules    hx RUL nodules x2  s/p right VATS w/ wedge resection 09-11-2005 and 06-14-2011  both  necrotizing granulomatous inflammation w/ cystic area of necrosis & focal calcification   History of right breast cancer oncologist-  dr Charolett Copes-- no recurrence   dx 2010--  IDC, Stage IA , ER/PR+,  (pT1c pN0) 11-09-2008 right lumpectomy;  12-18-2008 right simple mastectomy for DCIS margins;  completed chemotherapy 2010 (no radiation) and completed antiestrogen therapy   Hx of colonic polyps    Dr. 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   Nocturia    OA (osteoarthritis)    thumbs   On supplemental oxygen  therapy    03-06-2018 per pt uses only at night,  checks O2 sats at home,  stated am sat 89% after moving around average 93-94% with RA   Osteoporosis    Peripheral arterial occlusive disease (HCC) vascular-- dr chen/ dr Vikki Graves   proximal right SFA severe focal stenosis with collaterals from the PFA/  04/ 2012 occluded celiac and SMA arteries with distal reconstitution w/ patent IMA   Peripheral vascular disease (HCC)    chronic DVT RLE,  mesenteric vascular disease   Pneumonia    Right lower lobe pulmonary nodule    Chest CT 08-29-2017   Sleep apnea    Stage 3 severe COPD by GOLD classification (HCC) hx frequent exacerbations--   pulmologist-  dr Jennie Moeller--  per lov note , dated 12-20-2017, oxyogen 2L is prescribed for use with exertion (but pt only uses mostly at night), O2 sats on RA run  the 80s , this day sat 86% RA and with 2L O2 sat 92%   Varicose veins of leg with swelling    varicose vein surgery - Dr. Arvilla Laud   Wears glasses    Wears hearing aid in both ears    Past Surgical History:  Procedure Laterality Date   ABDOMINAL AORTOGRAM N/A 07/11/2020   Procedure: ABDOMINAL AORTOGRAM;  Surgeon: Adine Hoof, MD;  Location: [facility] INVASIVE CV LAB;  Service: Cardiovascular;  Laterality: N/A;   AORTIC ARCH ANGIOGRAPHY N/A 07/11/2020   Procedure: AORTIC ARCH ANGIOGRAPHY;  Surgeon: Adine Hoof, MD;  Location: [facility] INVASIVE CV LAB;  Service: Cardiovascular;  Laterality: N/A;   BREAST EXCISIONAL BIOPSY Left 10/15/2008   BREAST LUMPECTOMY W/ NEEDLE LOCALIZATION Right 11-09-2008    dr cornett   [facility]   CARDIAC CATHETERIZATION  03-12-2011   dr Mitzie Anda   70-80% ostial stenosis in small second diagonal (appears to small for intervention),  dLAD 40-50%,  minimal luminal irregulartieis involoving LCFx and RCA,  LVEF 55%   CATARACT EXTRACTION W/ INTRAOCULAR LENS  IMPLANT, BILATERAL  2011   CYSTOSCOPY N/A 03/11/2018   Procedure: CYSTOSCOPY WITH INSTILLATION OF POST OPERATIVE EPIRUBICIN ;  Surgeon: Christina Coyer, MD;  Location: [facility] ORS;  Service: Urology;  Laterality: N/A;   LEFT HEART CATH AND CORONARY ANGIOGRAPHY N/A 05/25/2020   Procedure: LEFT HEART CATH AND CORONARY ANGIOGRAPHY;  Surgeon: Millicent Ally, MD;  Location: [facility] INVASIVE CV LAB;  Service: Cardiovascular;  Laterality: N/A;   MASTECTOMY Right    PARTIAL COLECTOMY  2002   sigmoid and appectomy (diverticulitis)   PARTIAL MASTECTOMY WITH NEEDLE LOCALIZATION Left 02/23/2013   Procedure:  LEFT PARTIAL MASTECTOMY WITH NEEDLE LOCALIZATION;  Surgeon: Levert Ready, MD;  Location: [facility];  Service: General;  Laterality: Left;   PERIPHERAL VASCULAR INTERVENTION  07/11/2020   Procedure: PERIPHERAL VASCULAR INTERVENTION;  Surgeon: Adine Hoof, MD;  Location: [facility] INVASIVE CV LAB;   Service: Cardiovascular;;   RECONSTRUCTION BREAST W/ LATISSIMUS DORSI FLAP Right 05-30-2009   dr Hobart Lulas  [facility]   REDUCTION  MAMMAPLASTY Left    SIMPLE MASTECTOMY Right 12-28-2008    dr Afton Horse  [facility]   TRANSFORAMINAL LUMBAR INTERBODY FUSION W/ MIS 1 LEVEL Left 10/22/2022   Procedure: LUMBAR THREE-FOUR MINIMALLY INVASIVE TRANSFORAMINAL LUMBAR INTERBODY FUSION;  Surgeon: Cannon Champion, MD;  Location: [facility] OR;  Service: Neurosurgery;  Laterality: Left;  3C/RM 21 to follow   TRANSTHORACIC ECHOCARDIOGRAM  07-09-2016  dr Mitzie Anda   ef 55-60%, grade 1 diastoic dysfunction/  mild TR   TRANSURETHRAL RESECTION OF BLADDER TUMOR N/A 03/11/2018   Procedure: TRANSURETHRAL RESECTION OF BLADDER TUMOR (TURBT) 2-5cm;  Surgeon: Christina Coyer, MD;  Location: [facility];  Service: Urology;  Laterality: N/A;   UPPER EXTREMITY ANGIOGRAPHY Left 07/11/2020   Procedure: Upper Extremity Angiography;  Surgeon: Adine Hoof, MD;  Location: [facility] INVASIVE CV LAB;  Service: Cardiovascular;  Laterality: Left;   VAGINAL HYSTERECTOMY  1973   VIDEO ASSISTED THORACOSCOPY (VATS)/WEDGE RESECTION Right 09-11-2005  &  06-14-2011   dr hendrickso  [facility]   both RUL   Family History  Problem Relation Age of Onset   Colon cancer Mother        colon   COPD Mother        brown lung   Hypertension Mother    Diabetes Mother    Emphysema Mother    Coronary artery disease Father    Heart attack Father    Sudden death Father    Heart disease Father    Cancer Sister        throat   Hypertension Sister    Coronary artery disease Brother    Heart attack Brother        early 51s   Throat cancer Sister    Ovarian cancer Sister        fallopian tube cancer in her 46s   Melanoma Sister    Hypertension Sister    Pancreatic cancer Sister    Melanoma Niece        dx in her 4s   Breast cancer Niece 56   Social History   Socioeconomic History   Marital status: Married    Spouse name: Not on file   Number of children: 3    Years of education: 12   Highest education level: Not on file  Occupational History   Occupation: Magazine features editor: RETIRED    Comment: retired  Tobacco Use   Smoking status: Former    Current packs/day: 0.00    Average packs/day: 0.3 packs/day for 50.0 years (12.5 ttl pk-yrs)    Types: Cigarettes    Start date: 05/18/1966    Quit date: 05/18/2016    Years since quitting: 7.8   Smokeless tobacco: Never  Vaping Use   Vaping status: Never Used  Substance and Sexual Activity   Alcohol use: Yes    Alcohol/week: 14.0 standard drinks of alcohol    Types: 14 Cans of beer per week    Comment: every night   Drug use: No   Sexual activity: Not on file  Other Topics Concern   Not on file  Social History Narrative   ** Merged History Encounter **       HSG, beauty school. Married '69 -7 yrs/widowed; married '79 - 58yr/divorced; married '87.  2 sons - '70, '74, 1 srtep-son; 1 grandchild. 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 SO - good health.  NO history of abuse.  ACP - full code and all heroic measures.    Social Drivers of Health  Financial Resource Strain: Low Risk  (03/04/2024)   Overall Financial Resource Strain (CARDIA)    Difficulty of Paying Living Expenses: Not hard at all  Food Insecurity: No Food Insecurity (03/04/2024)   Hunger Vital Sign    Worried About Running Out of Food in the Last Year: Never true    Ran Out of Food in the Last Year: Never true  Transportation Needs: No Transportation Needs (03/04/2024)   PRAPARE - Administrator, Civil Service (Medical): No    Lack of Transportation (Non-Medical): No  Physical Activity: Inactive (03/04/2024)   Exercise Vital Sign    Days of Exercise per Week: 0 days    Minutes of Exercise per Session: 0 min  Stress: No Stress Concern Present (03/04/2024)   [employer] of Occupational Health - Occupational Stress Questionnaire    Feeling of Stress : Not at all  Social Connections: Moderately Isolated (03/04/2024)    Social Connection and Isolation Panel [NHANES]    Frequency of Communication with Friends and Family: More than three times a week    Frequency of Social Gatherings with Friends and Family: More than three times a week    Attends Religious Services: Never    Database administrator or Organizations: No    Attends Engineer, structural: Never    Marital Status: Married    Tobacco Counseling Counseling given: Not Answered    Clinical Intake:  Pre-visit preparation completed: Yes  Pain : No/denies pain Pain Score: 0-No pain     BMI - recorded: 21.8 Nutritional Status: BMI of 19-24  Normal Nutritional Risks: None Diabetes: No  Lab Results  Component Value Date   HGBA1C 5.7 07/19/2021   HGBA1C 5.8 (H) 03/10/2021   HGBA1C 5.7 (H) 01/03/2018     How often do you need to have someone help you when you read instructions, pamphlets, or other written materials from your doctor or pharmacy?: 1 - Never  Interpreter Needed?: No  Information entered by :: Farris Hong LPN   Activities of Daily Living     03/04/2024    8:52 AM 12/09/2023    1:00 PM  In your present state of health, do you have any difficulty performing the following activities:  Hearing? 1 1  Comment Wears Hearing Aids   Vision? 0 0  Difficulty concentrating or making decisions? 0 0  Walking or climbing stairs? 0   Dressing or bathing? 0   Doing errands, shopping? 0 0  Preparing Food and eating ? N   Using the Toilet? N   In the past six months, have you accidently leaked urine? N   Do you have problems with loss of bowel control? N   Managing your Medications? N   Managing your Finances? N   Housekeeping or managing your Housekeeping? N     Patient Care Team: [place], [name], MD as PCP - General (Family Medicine) Sonny Dust, MD (Inactive) as PCP - Cardiology (Cardiology) Zelphia Higashi, MD (Cardiothoracic Surgery) Debbie Fails Laura Polio, NP as Nurse Practitioner  (Hematology and Oncology) Maire Scot, MD as Consulting Physician (Pulmonary Disease)  Indicate any recent Medical Services you may have received from other than [facility] providers in the past year (date may be approximate).     Assessment:    This is a routine wellness examination for Cheryl.  Hearing/Vision screen Hearing Screening - Comments:: Wears Hearing Aids Vision Screening - Comments::  -Not up to date with routine eye exams with  You have been referred to [facility] for a complete eye exam. If you haven't heard from them in a few days, please call them to schedule your appointment.  [facility] [address] [place] [address] [place] [address] Phone: [phone]     Goals Addressed               This Visit's Progress     Increase physical activity (pt-stated)        Breathe better.       Depression Screen     03/04/2024    8:50 AM 12/27/2023    1:49 PM 05/13/2023    2:26 PM 02/25/2023    9:24 AM 12/25/2022    1:57 PM 06/29/2022    1:59 PM 01/29/2022   12:31 PM  PHQ 2/9 Scores  PHQ - 2 Score 0 0 0 0 0 0 0  PHQ- 9 Score   9   12 2     Fall Risk     03/04/2024    8:54 AM 12/27/2023    1:49 PM 05/13/2023    2:26 PM 02/25/2023    9:25 AM 12/25/2022    1:57 PM  Fall Risk   Falls in the past year? 1 0 0 0 0  Number falls in past yr: 0 0 0 0 0  Injury with Fall? 0 0 0 0 0  Risk for fall due to : No Fall Risks No Fall Risks History of fall(s);Impaired balance/gait No Fall Risks Other (Comment)  Follow up Falls evaluation completed Falls evaluation completed Falls evaluation completed Falls prevention discussed Falls evaluation completed    MEDICARE RISK AT HOME:  Medicare Risk at Home Any stairs in or around the home?: Yes If so, are there any without handrails?: No Home free of loose throw rugs in walkways, pet beds, electrical cords, etc?: Yes Adequate lighting in your home to reduce risk of falls?: Yes Life alert?: Yes Use of a cane, walker or w/c?:  No Grab bars in the bathroom?: Yes Shower chair or bench in shower?: Yes Elevated toilet seat or a handicapped toilet?: Yes  TIMED UP AND GO:  Was the test performed?  No  Cognitive Function: 6CIT completed        03/04/2024    8:55 AM 02/25/2023    9:27 AM 01/08/2022    2:11 PM  6CIT Screen  What Year? 0 points 0 points 4 points  What month? 0 points 3 points 0 points  What time? 0 points 0 points 0 points  Count back from 20 0 points 0 points 0 points  Months in reverse 0 points 4 points 2 points  Repeat phrase 0 points 4 points 8 points  Total Score 0 points 11 points 14 points    Immunizations Immunization History  Administered Date(s) Administered   Fluad Quad(high Dose 65+) 08/26/2019, 07/19/2021, 07/30/2022   Fluad Trivalent(High Dose 65+) 06/28/2023   Influenza, High Dose Seasonal PF 10/03/2017, 06/20/2018   Influenza,inj,Quad PF,6+ Mos 08/10/2014, 08/12/2015, 07/03/2016   PFIZER(Purple Top)SARS-COV-2 Vaccination 01/06/2020   Pneumococcal Conjugate-13 08/10/2014   Pneumococcal Polysaccharide-23 08/12/2015   Td 10/16/2007   Zoster, Live 11/09/2010    Screening Tests Health Maintenance  Topic Date Due   Zoster Vaccines- Shingrix (1 of 2) 11[birth year]14/1964   COVID-19 Vaccine (2 - Pfizer risk series) 01/27/2020   INFLUENZA VACCINE  05/15/2024   Medicare Annual Wellness (AWV)  03/04/2025   Pneumonia Vaccine 68+ Years old  Completed   DEXA  SCAN  Completed   Hepatitis C Screening  Completed   HPV VACCINES  Aged Out   Meningococcal B Vaccine  Aged Out   Lung Cancer Screening  Discontinued   DTaP/Tdap/Td  Discontinued   Colonoscopy  Discontinued    Health Maintenance  Health Maintenance Due  Topic Date Due   Zoster Vaccines- Shingrix (1 of 2) 11/14/1964   COVID-19 Vaccine (2 - Pfizer risk series) 01/27/2020   Health Maintenance Items Addressed: Referral made for eye exam.  Additional Screening:  Vision Screening: Recommended annual ophthalmology exams for  early detection of glaucoma and other disorders of the eye.  Dental Screening: Recommended annual dental exams for proper oral hygiene  Community Resource Referral / Chronic Care Management: CRR required this visit?  No   CCM required this visit?  No   Plan:    I have personally reviewed and noted the following in the patient's chart:   Medical and social history Use of alcohol, tobacco or illicit drugs  Current medications and supplements including opioid prescriptions. Patient is not currently taking opioid prescriptions. Functional ability and status Nutritional status Physical activity Advanced directives List of other physicians Hospitalizations, surgeries, and ER visits in previous 12 months Vitals Screenings to include cognitive, depression, and falls Referrals and appointments  In addition, I have reviewed and discussed with patient certain preventive protocols, quality metrics, and best practice recommendations. A written personalized care plan for preventive services as well as general preventive health recommendations were provided to patient.   Dewayne Ford, LPN   10/20/1094   After Visit Summary: (MyChart) Due to this being a telephonic visit, the after visit summary with patients personalized plan was offered to patient via MyChart   Notes: Nothing significant to report at this time.

## 2024-03-04 NOTE — Patient Instructions (Addendum)
 [name] , Thank you for taking time out of your busy schedule to complete your Annual Wellness Visit with me. I enjoyed our conversation and look forward to speaking with you again next year. I, as well as your care team,  appreciate your ongoing commitment to your health goals. Please review the following plan we discussed and let me know if I can assist you in the future. Your Game plan/ To Do List    Referrals: If you haven't heard from the office you've been referred to, please reach out to them at the phone provided.  You have been referred to [facility] for a complete eye exam. If you haven't heard from them in a few days, please call them to schedule your appointment.  [facility] [address] [place] [address] Phone: [phone]   Follow up Visits: Next Medicare AWV with our clinical staff: 03/10/25 @ 10a   Have you seen your provider in the last 6 months (3 months if uncontrolled diabetes)?  Next Office Visit with your provider: 06/29/24 @ 2p  Clinician Recommendations:  Aim for 30 minutes of exercise or brisk walking, 6-8 glasses of water, and 5 servings of fruits and vegetables each day.       This is a list of the screening recommended for you and due dates:  Health Maintenance  Topic Date Due   Zoster (Shingles) Vaccine (1 of 2) 11/14/1964   COVID-19 Vaccine (2 - Pfizer risk series) 01/27/2020   Flu Shot  05/15/2024   Medicare Annual Wellness Visit  03/04/2025   Pneumonia Vaccine  Completed   DEXA scan (bone density measurement)  Completed   Hepatitis C Screening  Completed   HPV Vaccine  Aged Out   Meningitis B Vaccine  Aged Out   Screening for Lung Cancer  Discontinued   DTaP/Tdap/Td vaccine  Discontinued   Colon Cancer Screening  Discontinued    Advanced directives: (Copy Requested) Please bring a copy of your health care power of attorney and living will to the office to be added to your chart at your convenience. You can mail to [facility] [address] [place], [place] [address] or email to [email or web address] Advance Care Planning is important because it:  [x]  Makes sure you receive the medical care that is consistent with your values, goals, and preferences  [x]  It provides guidance to your family and loved ones and reduces their decisional burden about whether or not they are making the right decisions based on your wishes.  Follow the link provided in your after visit summary or read over the paperwork we have mailed to you to help you started getting your Advance Directives in place. If you need assistance in completing these, please reach out to us  so that we can help you!  See attachments for Preventive Care and Fall Prevention Tips.

## 2024-03-11 DIAGNOSIS — R0602 Shortness of breath: Secondary | ICD-10-CM | POA: Diagnosis not present

## 2024-03-11 DIAGNOSIS — J449 Chronic obstructive pulmonary disease, unspecified: Secondary | ICD-10-CM | POA: Diagnosis not present

## 2024-03-11 DIAGNOSIS — J9601 Acute respiratory failure with hypoxia: Secondary | ICD-10-CM | POA: Diagnosis not present

## 2024-03-11 DIAGNOSIS — G4733 Obstructive sleep apnea (adult) (pediatric): Secondary | ICD-10-CM | POA: Diagnosis not present

## 2024-03-13 ENCOUNTER — Ambulatory Visit: Admitting: Gastroenterology

## 2024-03-13 ENCOUNTER — Encounter: Payer: Self-pay | Admitting: Internal Medicine

## 2024-03-13 ENCOUNTER — Encounter: Payer: Self-pay | Admitting: Gastroenterology

## 2024-03-13 VITALS — BP 126/76 | HR 61 | Ht 65.0 in | Wt 131.4 lb

## 2024-03-13 DIAGNOSIS — R194 Change in bowel habit: Secondary | ICD-10-CM | POA: Diagnosis not present

## 2024-03-13 DIAGNOSIS — I2510 Atherosclerotic heart disease of native coronary artery without angina pectoris: Secondary | ICD-10-CM

## 2024-03-13 DIAGNOSIS — R195 Other fecal abnormalities: Secondary | ICD-10-CM

## 2024-03-13 DIAGNOSIS — K551 Chronic vascular disorders of intestine: Secondary | ICD-10-CM

## 2024-03-13 DIAGNOSIS — I509 Heart failure, unspecified: Secondary | ICD-10-CM

## 2024-03-13 DIAGNOSIS — J449 Chronic obstructive pulmonary disease, unspecified: Secondary | ICD-10-CM

## 2024-03-13 DIAGNOSIS — R1030 Lower abdominal pain, unspecified: Secondary | ICD-10-CM

## 2024-03-13 DIAGNOSIS — J441 Chronic obstructive pulmonary disease with (acute) exacerbation: Secondary | ICD-10-CM

## 2024-03-13 DIAGNOSIS — I5042 Chronic combined systolic (congestive) and diastolic (congestive) heart failure: Secondary | ICD-10-CM

## 2024-03-13 DIAGNOSIS — R112 Nausea with vomiting, unspecified: Secondary | ICD-10-CM

## 2024-03-13 DIAGNOSIS — K219 Gastro-esophageal reflux disease without esophagitis: Secondary | ICD-10-CM

## 2024-03-13 DIAGNOSIS — I739 Peripheral vascular disease, unspecified: Secondary | ICD-10-CM

## 2024-03-13 DIAGNOSIS — Z87891 Personal history of nicotine dependence: Secondary | ICD-10-CM

## 2024-03-13 MED ORDER — PANTOPRAZOLE SODIUM 40 MG PO TBEC: 40.0000 mg | DELAYED_RELEASE_TABLET | Freq: Every day | ORAL | 3 refills | Status: DC

## 2024-03-13 NOTE — Patient Instructions (Addendum)
 A high fiber diet with plenty of fluids (up to 8 glasses of water daily) is suggested to relieve these symptoms. Benefiber, 1 tablespoon once or twice daily can be used to keep bowels regular if needed.   We have sent the following medications to your pharmacy for you to pick up at your convenience: Pantoprazole  40 mg once daily 30-60 minutes before breakfast.   _______________________________________________________  If your blood pressure at your visit was 140/90 or greater, please contact your primary care physician to follow up on this.  _______________________________________________________  If you are age 26 or older, your body mass index should be between 23-30. Your Body mass index is 21.86 kg/m. If this is out of the aforementioned range listed, please consider follow up with your Primary Care Provider.  If you are age 19 or younger, your body mass index should be between 19-25. Your Body mass index is 21.86 kg/m. If this is out of the aformentioned range listed, please consider follow up with your Primary Care Provider.   ________________________________________________________  The [facility] providers would like to encourage you to use MYCHART to communicate with providers for non-urgent requests or questions.  Due to long hold times on the telephone, sending your provider a message by [facility] may be a faster and more efficient way to get a response.  Please allow 48 business hours for a response.  Please remember that this is for non-urgent requests.  _______________________________________________________

## 2024-03-13 NOTE — Progress Notes (Signed)
 Chief Complaint: abdominal pain, change in bowel habits, GERD Primary GI MD: Unassigned  HPI: 78 year old female with a hx of CAD, hypertension, COPD on 2L oxygen  at night, right breast cancer, hyperlipidemia, peripheral arterial disease and chronic mesenteric ischemia, sp sigmoid colectomy for diverticulitis 2002, seen for abdominal pain, change in bowel habits, GERD  Previous patient of Dr. Dellis Fermo.  We do not have any records available. Last seen by [facility] 06/2023.  Seen by [facility] 06/2023 for LLQ pain and cholelithiasis. Cholecystectomy was not recommended as Dr. Leighton Punches felt LLQ pain and mild transaminase elevation was not secondary to cholelithiasis.  Discussed the use of AI scribe software for clinical note transcription with the patient, who gave verbal consent to proceed.  History of Present Illness She experiences severe acid reflux, described as a burning sensation that prevents her from drinking coffee, accompanied by frequent burping originating from the lower stomach. She is currently taking pantoprazole  20 mg once daily, which she feels is ineffective. This has been a long-standing issue.  She has had chronic diarrhea for the past five years, characterized by liquid stools and occasional incontinence. She manages it with over-the-counter anti-diarrheal medication, taking it at the first sign of diarrhea, which occurs about three to four times a day. She uses the medication at least three times a week and has solid stools between episodes.  She experiences abdominal pain after eating, located in the lower stomach area, and has a history of chronic mesenteric ischemia. She has previously consulted a vascular doctor for this condition. Additionally, she reports frequent vomiting with little warning, often consisting of 'nothing but pure acid'.  She has a history of gallstones, but surgery has not been recommended. She notes that her gallbladder is 'bad,' but it does not typically cause  pain or burping.  Her past medical history includes a colonoscopy in 2016 with findings of polyps. She has a family history of colon cancer, with her mother having been diagnosed at age 33. No change in bowel habits, weight loss, or rectal bleeding. She reports waking up after three to four hours of sleep to vomit.  PREVIOUS GI WORKUP   Colonoscopy:   12/08/2014. recommended in 5 years.  EGD:   12/08/2014, slight bile reflux, chronic inactive gastritis, benign small bowel mucosa.    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)    05/ 2018  chronic non-occlusive DVT RLE   Chronic kidney disease    COPD, frequent exacerbations (HCC)    03-06-2018 per pt last exacerbation 12/ 2018   Coronary artery disease    cardiologist-  dr Mitzie Anda-- 03-11-2018 er cath , 80% stenosis in the ostial second diagonal   Dyspnea on minimal exertion    Emphysema/COPD (HCC)    CAT score- 17   Family history of breast cancer    Family history of colon cancer    Family history of melanoma    Family history of ovarian cancer    Family history of pancreatic cancer    Fibromyalgia 1995   GERD (gastroesophageal reflux disease)    Hiatal hernia    History of breast cancer    History of diverticulitis of colon    2002  s/p  sigmoid colectomy   History of multiple pulmonary nodules  hx RUL nodules x2  s/p right VATS w/ wedge resection 09-11-2005 and 06-14-2011  both  necrotizing granulomatous inflammation w/ cystic area of necrosis & focal calcification   History of right breast cancer oncologist-  dr Charolett Copes-- no recurrence   dx 2010--  IDC, Stage IA , ER/PR+,  (pT1c pN0) 11-09-2008 right lumpectomy;  12-18-2008 right simple mastectomy for DCIS margins;  completed chemotherapy 2010 (no radiation) and completed antiestrogen therapy   Hx of colonic  polyps    Dr. 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   Nocturia    OA (osteoarthritis)    thumbs   On supplemental oxygen  therapy    03-06-2018 per pt uses only at night,  checks O2 sats at home,  stated am sat 89% after moving around average 93-94% with RA   Osteoporosis    Peripheral arterial occlusive disease (HCC) vascular-- dr chen/ dr Vikki Graves   proximal right SFA severe focal stenosis with collaterals from the PFA/  04/ 2012 occluded celiac and SMA arteries with distal reconstitution w/ patent IMA   Peripheral vascular disease (HCC)    chronic DVT RLE,  mesenteric vascular disease   Pneumonia    Right lower lobe pulmonary nodule    Chest CT 08-29-2017   Sleep apnea    Stage 3 severe COPD by GOLD classification (HCC) hx frequent exacerbations--   pulmologist-  dr Jennie Moeller--  per lov note , dated 12-20-2017, oxyogen 2L is prescribed for use with exertion (but pt only uses mostly at night), O2 sats on RA run the 80s , this day sat 86% RA and with 2L O2 sat 92%   Varicose veins of leg with swelling    varicose vein surgery - Dr. Arvilla Laud   Wears glasses    Wears hearing aid in both ears     Past Surgical History:  Procedure Laterality Date   ABDOMINAL AORTOGRAM N/A 07/11/2020   Procedure: ABDOMINAL AORTOGRAM;  Surgeon: Adine Hoof, MD;  Location[facility] INVASIVE CV LAB;  Service: Cardiovascular;  Laterality: N/A;   AORTIC ARCH ANGIOGRAPHY N/A 07/11/2020   Procedure: AORTIC ARCH ANGIOGRAPHY;  Surgeon: Adine Hoof, MD;  Location: [facility] INVASIVE CV LAB;  Service: Cardiovascular;  Laterality: N/A;   BREAST EXCISIONAL BIOPSY Left 10/15/2008   BREAST LUMPECTOMY W/ NEEDLE LOCALIZATION Right 11-09-2008    dr cornett   [facility]   CARDIAC CATHETERIZATION  03-12-2011   dr Mitzie Anda   70-80% ostial stenosis in small second diagonal (appears to small for intervention),   dLAD 40-50%,  minimal luminal irregulartieis involoving LCFx and RCA,  LVEF 55%   CATARACT EXTRACTION W/ INTRAOCULAR LENS  IMPLANT, BILATERAL  2011   CYSTOSCOPY N/A 03/11/2018   Procedure: CYSTOSCOPY WITH INSTILLATION OF POST OPERATIVE EPIRUBICIN ;  Surgeon: Christina Coyer, MD;  Location: [facility] ORS;  Service: Urology;  Laterality: N/A;   LEFT HEART CATH AND CORONARY ANGIOGRAPHY N/A 05/25/2020   Procedure: LEFT HEART CATH AND CORONARY ANGIOGRAPHY;  Surgeon: Millicent Ally, MD;  Location: [facility] INVASIVE CV LAB;  Service: Cardiovascular;  Laterality: N/A;   MASTECTOMY Right    PARTIAL COLECTOMY  2002   sigmoid and appectomy (diverticulitis)   PARTIAL MASTECTOMY WITH NEEDLE LOCALIZATION Left 02/23/2013   Procedure:  LEFT PARTIAL MASTECTOMY WITH NEEDLE LOCALIZATION;  Surgeon: Levert Ready, MD;  Location: [facility];  Service: General;  Laterality: Left;  PERIPHERAL VASCULAR INTERVENTION  07/11/2020   Procedure: PERIPHERAL VASCULAR INTERVENTION;  Surgeon: Adine Hoof, MD;  Location: [facility] INVASIVE CV LAB;  Service: Cardiovascular;;   RECONSTRUCTION BREAST W/ LATISSIMUS DORSI FLAP Right 05-30-2009   dr Hobart Lulas  [facility]   REDUCTION MAMMAPLASTY Left    SIMPLE MASTECTOMY Right 12-28-2008    dr Afton Horse  [facility]   TRANSFORAMINAL LUMBAR INTERBODY FUSION W/ MIS 1 LEVEL Left 10/22/2022   Procedure: LUMBAR THREE-FOUR MINIMALLY INVASIVE TRANSFORAMINAL LUMBAR INTERBODY FUSION;  Surgeon: Cannon Champion, MD;  Location: [facility] OR;  Service: Neurosurgery;  Laterality: Left;  3C/RM 21 to follow   TRANSTHORACIC ECHOCARDIOGRAM  07-09-2016  dr Mitzie Anda   ef 55-60%, grade 1 diastoic dysfunction/  mild TR   TRANSURETHRAL RESECTION OF BLADDER TUMOR N/A 03/11/2018   Procedure: TRANSURETHRAL RESECTION OF BLADDER TUMOR (TURBT) 2-5cm;  Surgeon: Christina Coyer, MD;  Location: [facility] ORS;  Service: Urology;  Laterality: N/A;   UPPER EXTREMITY ANGIOGRAPHY Left 07/11/2020   Procedure: Upper Extremity Angiography;   Surgeon: Adine Hoof, MD;  Location: [facility] INVASIVE CV LAB;  Service: Cardiovascular;  Laterality: Left;   VAGINAL HYSTERECTOMY  1973   VIDEO ASSISTED THORACOSCOPY (VATS)/WEDGE RESECTION Right 09-11-2005  &  06-14-2011   dr Charlotta Cook  [facility]   both RUL    Current Outpatient Medications  Medication Sig Dispense Refill   acetaminophen  (ACETAMINOPHEN  8 HOUR) 650 MG CR tablet Take 1,300 mg by mouth every 8 (eight) hours as needed for pain.     albuterol  (VENTOLIN  HFA) 108 (90 Base) MCG/ACT inhaler INHALE TWO PUFFS BY MOUTH EVERY 6 HOURS AS NEEDED FOR WHEEZE OR SHORTNESS OF BREATH 18 g 7   ammonium lactate (AMLACTIN DAILY) 12 % lotion Apply 1 Application topically daily.     aspirin  EC 81 MG tablet Take 1 tablet (81 mg total) by mouth daily. 30 tablet    atorvastatin  (LIPITOR ) 80 MG tablet Take 1 tablet (80 mg total) by mouth daily. 90 tablet 3   budesonide  (PULMICORT ) 0.5 MG/2ML nebulizer solution Take 2 mLs (0.5 mg total) by nebulization 2 (two) times daily. (Patient taking differently: Take 0.5 mg by nebulization as needed (SOB, wheezing).) 20 mL 12   Carboxymethylcellulose Sodium (THERATEARS) 0.25 % SOLN Place 2 drops into both eyes as needed (Dry eyes).     cilostazol  (PLETAL ) 100 MG tablet TAKE 1 TABLET BY MOUTH TWICE  DAILY 30 tablet 0   clobetasol (TEMOVATE) 0.05 % external solution Apply 1 Application topically 2 (two) times daily. Apply to scalp for seborrhea.     clopidogrel  (PLAVIX ) 75 MG tablet TAKE ONE TABLET BY MOUTH DAILY AT 9AM 90 tablet 5   DUPIXENT  300 MG/2ML SOPN Inject 300 mg into the skin every 14 (fourteen) days.     ezetimibe  (ZETIA ) 10 MG tablet Take 1 tablet (10 mg total) by mouth daily. 90 tablet 1   FEROSUL 325 (65 Fe) MG tablet TAKE ONE TABLET BY MOUTH DAILY AT 9AM WITH BREAKFAST 90 tablet 2   fluticasone  (FLONASE ) 50 MCG/ACT nasal spray SPRAY 1 SPRAY IN EACH NOSTRIL TWICE DAILY 48 g 2   furosemide  (LASIX ) 40 MG tablet TAKE ONE TABLET BY MOUTH TWICE DAILY 180  tablet 2   hydrOXYzine  (ATARAX ) 25 MG tablet Take 25 mg by mouth at bedtime.     ipratropium-albuterol  (DUONEB) 0.5-2.5 (3) MG/3ML SOLN Inhale 3 mLs into the lungs as directed. TID for 1 week and then use as needed Q6 hours. (Patient taking differently: Inhale 3 mLs into the lungs  every 6 (six) hours as needed (wheezing/SOB).) 360 mL 4   levothyroxine  (SYNTHROID ) 112 MCG tablet TAKE 1/2 TABLET BY MOUTH TUESDAY AND THURSDAY and TAKE ONE TABLET BY MOUTH ON MONDAY, WEDNESDAY, FRIDAY, SATURDAY & SUNDAY 86 tablet 11   LORazepam  (ATIVAN ) 2 MG tablet TAKE ONE TABLET (2 MG TOTAL) BY MOUTH DAILY AT BEDTIME 30 tablet 3   montelukast  (SINGULAIR ) 10 MG tablet TAKE 1 TABLET BY MOUTH EVERYDAY AT BEDTIME 90 tablet 1   pantoprazole  (PROTONIX ) 40 MG tablet Take 1 tablet (40 mg total) by mouth daily. 30 tablet 3   PARoxetine  (PAXIL ) 20 MG tablet TAKE ONE TABLET BY MOUTH DAILY AT 9AM 90 tablet 2   potassium chloride  SA (KLOR-CON  M) 20 MEQ tablet TAKE 1 TABLET BY MOUTH DAILY 100 tablet 2   tamsulosin  (FLOMAX ) 0.4 MG CAPS capsule TAKE 1 CAPSULE BY MOUTH DAILY 100 capsule 2   traZODone  (DESYREL ) 100 MG tablet TAKE ONE TABLET BY MOUTH DAILY AT 9PM AT BEDTIME 90 tablet 2   TRELEGY ELLIPTA  200-62.5-25 MCG/ACT AEPB INHALE 1 PUFF BY MOUTH ONCE DAILY (RINSE MOUTH AFTER EACH USE) (BULK) 60 each 11   urea (CARMOL) 40 % CREA Apply 1 Application topically daily as needed (eczema).     No current facility-administered medications for this visit.    Allergies as of 03/13/2024 - Review Complete 03/13/2024  Allergen Reaction Noted   Sinequan  [doxepin ] Other (See Comments) 07/31/2021   Sulfa antibiotics Swelling 03/06/2018   Sulfur  09/28/2021    Family History  Problem Relation Age of Onset   Colon cancer Mother        colon   COPD Mother        brown lung   Hypertension Mother    Diabetes Mother    Emphysema Mother    Coronary artery disease Father    Heart attack Father    Sudden death Father    Heart disease  Father    Cancer Sister        throat   Hypertension Sister    Coronary artery disease Brother    Heart attack Brother        early 37s   Throat cancer Sister    Ovarian cancer Sister        fallopian tube cancer in her 38s   Melanoma Sister    Hypertension Sister    Pancreatic cancer Sister    Melanoma Niece        dx in her 51s   Breast cancer Niece 63    Social History   Socioeconomic History   Marital status: Married    Spouse name: Not on file   Number of children: 3   Years of education: 12   Highest education level: Not on file  Occupational History   Occupation: Magazine features editor: RETIRED    Comment: retired  Tobacco Use   Smoking status: Former    Current packs/day: 0.00    Average packs/day: 0.3 packs/day for 50.0 years (12.5 ttl pk-yrs)    Types: Cigarettes    Start date: 05/18/1966    Quit date: 05/18/2016    Years since quitting: 7.8   Smokeless tobacco: Never  Vaping Use   Vaping status: Never Used  Substance and Sexual Activity   Alcohol use: Yes    Alcohol/week: 14.0 standard drinks of alcohol    Types: 14 Cans of beer per week    Comment: every night   Drug use: No   Sexual  activity: Not on file  Other Topics Concern   Not on file  Social History Narrative   ** Merged History Encounter **       HSG, beauty school. Married '69 -7 yrs/widowed; married '79 - 48yr/divorced; married '87.  2 sons - '70, '74, 1 srtep-son; 1 grandchild. 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 SO - good health.  NO history of abuse.  ACP - full code and all heroic measures.    Social Drivers of Corporate investment banker Strain: Low Risk  (03/04/2024)   Overall Financial Resource Strain (CARDIA)    Difficulty of Paying Living Expenses: Not hard at all  Food Insecurity: No Food Insecurity (03/04/2024)   Hunger Vital Sign    Worried About Running Out of Food in the Last Year: Never true    Ran Out of Food in the Last Year: Never true  Transportation Needs: No  Transportation Needs (03/04/2024)   PRAPARE - Administrator, Civil Service (Medical): No    Lack of Transportation (Non-Medical): No  Physical Activity: Inactive (03/04/2024)   Exercise Vital Sign    Days of Exercise per Week: 0 days    Minutes of Exercise per Session: 0 min  Stress: No Stress Concern Present (03/04/2024)   [employer] of Occupational Health - Occupational Stress Questionnaire    Feeling of Stress : Not at all  Social Connections: Moderately Isolated (03/04/2024)   Social Connection and Isolation Panel [NHANES]    Frequency of Communication with Friends and Family: More than three times a week    Frequency of Social Gatherings with Friends and Family: More than three times a week    Attends Religious Services: Never    Database administrator or Organizations: No    Attends Banker Meetings: Never    Marital Status: Married  Catering manager Violence: Not At Risk (03/04/2024)   Humiliation, Afraid, Rape, and Kick questionnaire    Fear of Current or Ex-Partner: No    Emotionally Abused: No    Physically Abused: No    Sexually Abused: No    Review of Systems:    Constitutional: No weight loss, fever, chills, weakness or fatigue HEENT: Eyes: No change in vision               Ears, Nose, Throat:  No change in hearing or congestion Skin: No rash or itching Cardiovascular: No chest pain, chest pressure or palpitations   Respiratory: No SOB or cough Gastrointestinal: See HPI and otherwise negative Genitourinary: No dysuria or change in urinary frequency Neurological: No headache, dizziness or syncope Musculoskeletal: No new muscle or joint pain Hematologic: No bleeding or bruising Psychiatric: No history of depression or anxiety    Physical Exam:  Vital signs: BP 126/76   Pulse 61   Ht 5\' 5"  (1.651 m)   Wt 131 lb 6 oz (59.6 kg)   SpO2 93%   BMI 21.86 kg/m   Constitutional: NAD, alert and cooperative, frail, thin, mild difficulty  getting on exam table Head:  Normocephalic and atraumatic. Eyes:   PEERL, EOMI. No icterus. Conjunctiva pink. Respiratory: Respirations even and unlabored. Lungs clear to auscultation bilaterally.   No wheezes, crackles, or rhonchi.  Cardiovascular:  Regular rate and rhythm. No peripheral edema, cyanosis or pallor.  Gastrointestinal:  Soft, nondistended, nontender. No rebound or guarding. Normal bowel sounds. No appreciable masses or hepatomegaly. Rectal:  Declines Msk:  Symmetrical without gross deformities. Without edema, no deformity or joint  abnormality.  Neurologic:  Alert and  oriented x4;  grossly normal neurologically.  Skin:   Dry and intact without significant lesions or rashes. Psychiatric: Oriented to person, place and time. Demonstrates good judgement and reason without abnormal affect or behaviors.  RELEVANT LABS AND IMAGING: CBC    Component Value Date/Time   WBC 6.1 02/16/2024 2130   RBC 4.66 02/16/2024 2130   HGB 15.3 (H) 02/16/2024 2130   HGB 14.3 02/16/2024 2130   HGB 14.5 02/05/2023 1433   HGB 15.3 06/08/2020 1510   HGB 13.7 07-09-2017 1045   HCT 45.0 02/16/2024 2130   HCT 43.0 02/16/2024 2130   HCT 44.5 06/08/2020 1510   HCT 42.4 09-Jul-2017 1045   PLT 363 02/16/2024 2130   PLT 510 (H) 02/05/2023 1433   PLT 322 06/08/2020 1510   MCV 92.3 02/16/2024 2130   MCV 90 06/08/2020 1510   MCV 87.8 09-Jul-2017 1045   MCH 30.7 02/16/2024 2130   MCHC 33.3 02/16/2024 2130   RDW 13.7 02/16/2024 2130   RDW 13.0 06/08/2020 1510   RDW 14.9 (H) 07-09-2017 1045   LYMPHSABS 1.2 02/16/2024 2130   LYMPHSABS 1.1 01/06/2020 1732   LYMPHSABS 1.1 2017-07-09 1045   MONOABS 0.6 02/16/2024 2130   MONOABS 0.7 07/09/17 1045   EOSABS 0.6 (H) 02/16/2024 2130   EOSABS 0.5 (H) 01/06/2020 1732   BASOSABS 0.1 02/16/2024 2130   BASOSABS 0.1 01/06/2020 1732   BASOSABS 0.0 2017-07-09 1045    CMP     Component Value Date/Time   NA 136 02/25/2024 1425   NA 138 06/08/2020 1510   NA  141 02/17/2015 1249   K 3.8 02/25/2024 1425   K 4.3 02/17/2015 1249   CL 97 02/25/2024 1425   CL 108 (H) 08/21/2012 1501   CO2 30 02/25/2024 1425   CO2 25 02/17/2015 1249   GLUCOSE 90 02/25/2024 1425   GLUCOSE 85 02/17/2015 1249   GLUCOSE 101 (H) 08/21/2012 1501   BUN 18 02/25/2024 1425   BUN 13 06/08/2020 1510   BUN 16.6 02/17/2015 1249   CREATININE 0.88 02/25/2024 1425   CREATININE 0.87 02/05/2023 1433   CREATININE 0.95 (H) 09/26/2020 1558   CREATININE 0.8 02/17/2015 1249   CALCIUM  9.4 02/25/2024 1425   CALCIUM  9.3 02/17/2015 1249   PROT 7.8 02/25/2024 1425   PROT 7.6 06/04/2018 1200   PROT 7.2 02/17/2015 1249   ALBUMIN  4.4 02/25/2024 1425   ALBUMIN  4.6 06/04/2018 1200   ALBUMIN  3.9 02/17/2015 1249   AST 29 02/25/2024 1425   AST 24 02/05/2023 1433   AST 17 02/17/2015 1249   ALT 28 02/25/2024 1425   ALT 21 02/05/2023 1433   ALT 23 02/17/2015 1249   ALKPHOS 118 (H) 02/25/2024 1425   ALKPHOS 77 02/17/2015 1249   BILITOT 0.5 02/25/2024 1425   BILITOT 0.5 02/05/2023 1433   BILITOT 0.38 02/17/2015 1249   GFRNONAA >60 02/16/2024 2130   GFRNONAA >60 02/05/2023 1433   GFRNONAA 59 (L) 09/26/2020 1558   GFRAA 68 09/26/2020 1558     Assessment/Plan:  78 year old female with a hx of CAD, hypertension, COPD on 2L oxygen  at night, right breast cancer, hyperlipidemia, peripheral arterial disease and chronic mesenteric ischemia, on plavix ,  s/p sigmoid colectomy for diverticulitis 2002, seen for abdominal pain, change in bowel habits, GERD  Chronic Mesenteric Ischemia She had a mesenteric angiogram done in 02/2011 by Dr. Farrel Hones, showing occluded celiac and SMA arteries with patent IMA.  She was planned for  aorto-mesenteric bypass. She was referred for cardiology evaluation prior to surgery. However, it was ultimately decided not to treat her mesenteric disease surgically. Recently evaluated by vascular surgery 12/2023 with recommendation to continue statin and plavix  but was reportedly  asymptomatic at that time. Now having abdominal pain with eating. Suspect this is secondary to her known chronic mesenteric ischemia -- Follow-up with vascular surgeons  GERD Nausea/Vomiting GERD uncontrolled on pantoprazole  20 Mg once daily with EGD done at [facility] in 2016 showing H. pylori negative gastritis with reflux (we will obtain this record).  Nausea and vomiting at bedtime of bile acid.  Patient is high risk to undergo endoscopic evaluation secondary to vascular disease, Plavix  use, COPD on oxygen  at night, and age. - Increase pantoprazole  to 40 Mg once daily - Educated patient on lifestyle modifications - Follow-up 8 weeks, if persistent symptoms can further escalate antacid regimen - If symptoms absolutely refractory to medication will consider EGD  Altered bowel habits In Care Everywhere it appears she was previously seen by Dr. Dellis Fermo for constipation and now she reports altered habits of diarrhea versus solid/formed stools over the past few years.  Colonoscopy in 2016 and family history of colon cancer in mother (we will obtain this record).  She is high risk for endoscopic evaluation but is definitely overdue for colonoscopy.  Discussed risks versus benefits of colonoscopy and she would prefer to wait until we receive colonoscopy record prior to considering colonoscopy which I think is reasonable.   Suspect her altered bowel habits are IBS related and from using Imodium  on a regular basis.  CT abdomen pelvis without contrast last year overall unrevealing. - Recommend trial of discontinuing Imodium  and doing fiber only for 1 to 2 weeks and see how she does - If no improvement on fiber can add back and Imodium  - Obtain previous colonoscopy record - Consider repeat colonoscopy if persistent symptoms or concerning previous colonoscopy. She is high risk at this time from cardiopulmonary standpoint in addition to age.  Nonobstructive CAD On ASA and plavix  due to stent 06/2020. Has not  been seen by cardiology since 2023.  Heart Failure Echocardiogram 2022 with EF 55 to 60%  COPD On 2L oxygen  at bedtime  Breast cancer Following with oncology. S/p mastectomy 2010 with adjuvant chemotherapy completed 2016  Assigned to Dr. Cherryl Corona today (Friday)  [name] [facility] 03/13/2024, 3:40 PM  Cc: [place], [name], MD

## 2024-03-22 DIAGNOSIS — J449 Chronic obstructive pulmonary disease, unspecified: Secondary | ICD-10-CM | POA: Diagnosis not present

## 2024-03-25 ENCOUNTER — Other Ambulatory Visit: Payer: Self-pay | Admitting: Family Medicine

## 2024-03-25 NOTE — Progress Notes (Signed)
 Agree with the assessment and plan as outlined by Suzanna Erp, PA-C.  EGD and colonoscopy unlikely to provide therapeutic benefit, and do pose risk.  Would continue to manage supportively for now.

## 2024-03-31 DIAGNOSIS — H905 Unspecified sensorineural hearing loss: Secondary | ICD-10-CM | POA: Diagnosis not present

## 2024-04-09 ENCOUNTER — Other Ambulatory Visit: Payer: Self-pay | Admitting: Family Medicine

## 2024-04-09 DIAGNOSIS — I2510 Atherosclerotic heart disease of native coronary artery without angina pectoris: Secondary | ICD-10-CM

## 2024-04-09 DIAGNOSIS — I10 Essential (primary) hypertension: Secondary | ICD-10-CM

## 2024-04-09 DIAGNOSIS — E7800 Pure hypercholesterolemia, unspecified: Secondary | ICD-10-CM

## 2024-04-13 ENCOUNTER — Other Ambulatory Visit: Payer: Self-pay | Admitting: Family Medicine

## 2024-04-13 DIAGNOSIS — F419 Anxiety disorder, unspecified: Secondary | ICD-10-CM

## 2024-04-13 DIAGNOSIS — G4700 Insomnia, unspecified: Secondary | ICD-10-CM

## 2024-04-13 DIAGNOSIS — E785 Hyperlipidemia, unspecified: Secondary | ICD-10-CM

## 2024-04-13 DIAGNOSIS — I10 Essential (primary) hypertension: Secondary | ICD-10-CM

## 2024-04-13 DIAGNOSIS — I2510 Atherosclerotic heart disease of native coronary artery without angina pectoris: Secondary | ICD-10-CM

## 2024-04-13 MED ORDER — EZETIMIBE 10 MG PO TABS: 10.0000 mg | ORAL_TABLET | Freq: Every day | ORAL | 1 refills | Status: DC

## 2024-04-13 MED ORDER — FERROUS SULFATE 325 (65 FE) MG PO TABS: 325.0000 mg | ORAL_TABLET | Freq: Every day | ORAL | 2 refills | Status: DC

## 2024-04-13 MED ORDER — PANTOPRAZOLE SODIUM 40 MG PO TBEC: 40.0000 mg | DELAYED_RELEASE_TABLET | Freq: Every day | ORAL | 3 refills | Status: DC

## 2024-04-13 MED ORDER — FLUTICASONE PROPIONATE 50 MCG/ACT NA SUSP: NASAL | 2 refills | Status: DC

## 2024-04-13 NOTE — Telephone Encounter (Signed)
 Copied from CRM [id number]. Topic: Clinical - Medication Refill >> Apr 13, 2024 10:52 AM Suzen RAMAN wrote: Medication: LORazepam  (ATIVAN ) 2 MG tablet FEROSUL 325 (65 Fe) MG tablet ezetimibe  (ZETIA ) 10 MG tablet fluticasone  (FLONASE ) 50 MCG/ACT nasal spray pantoprazole  (PROTONIX ) 40 MG tablet  Need refill 24-48 hours since patient is currently low on medication. Caller advised that Rx refills may take up to 3 business days.  Has the patient contacted their pharmacy? Yes  This is the patient's preferred pharmacy:   [facility] - [place] - [address] [place] [address] Phone: [phone] Fax: [phone]  Is this the correct pharmacy for this prescription? Yes If no, delete pharmacy and type the correct one.   Has the prescription been filled recently? No  Is the patient out of the medication? No  Has the patient been seen for an appointment in the last year OR does the patient have an upcoming appointment? Yes  Can we respond through MyChart? Yes  Agent: Please be advised that Rx refills may take up to 3 business days. We ask that you follow-up with your pharmacy.

## 2024-04-13 NOTE — Telephone Encounter (Signed)
 Can you refill in PCP's absence? Thank you!

## 2024-04-14 DIAGNOSIS — R0602 Shortness of breath: Secondary | ICD-10-CM | POA: Diagnosis not present

## 2024-04-14 DIAGNOSIS — J9601 Acute respiratory failure with hypoxia: Secondary | ICD-10-CM | POA: Diagnosis not present

## 2024-04-14 DIAGNOSIS — G4733 Obstructive sleep apnea (adult) (pediatric): Secondary | ICD-10-CM | POA: Diagnosis not present

## 2024-04-14 DIAGNOSIS — J449 Chronic obstructive pulmonary disease, unspecified: Secondary | ICD-10-CM | POA: Diagnosis not present

## 2024-04-21 DIAGNOSIS — J449 Chronic obstructive pulmonary disease, unspecified: Secondary | ICD-10-CM | POA: Diagnosis not present

## 2024-04-22 ENCOUNTER — Other Ambulatory Visit (INDEPENDENT_AMBULATORY_CARE_PROVIDER_SITE_OTHER)

## 2024-04-22 DIAGNOSIS — E038 Other specified hypothyroidism: Secondary | ICD-10-CM | POA: Diagnosis not present

## 2024-04-22 LAB — TSH: TSH: 21.79 u[IU]/mL — ABNORMAL HIGH (ref 0.35–5.50)

## 2024-04-24 ENCOUNTER — Ambulatory Visit: Admitting: Internal Medicine

## 2024-04-27 ENCOUNTER — Ambulatory Visit: Payer: Self-pay | Admitting: Family Medicine

## 2024-04-29 ENCOUNTER — Other Ambulatory Visit: Payer: Self-pay | Admitting: Family Medicine

## 2024-04-29 MED ORDER — FUROSEMIDE 40 MG PO TABS: 40.0000 mg | ORAL_TABLET | Freq: Two times a day (BID) | ORAL | 2 refills | Status: AC

## 2024-04-29 MED ORDER — PANTOPRAZOLE SODIUM 40 MG PO TBEC: 40.0000 mg | DELAYED_RELEASE_TABLET | Freq: Every day | ORAL | 3 refills | Status: DC

## 2024-04-29 MED ORDER — TAMSULOSIN HCL 0.4 MG PO CAPS: ORAL_CAPSULE | ORAL | 2 refills | Status: DC

## 2024-04-29 NOTE — Telephone Encounter (Signed)
 Copied from CRM [id number]. Topic: Clinical - Medication Refill >> Apr 29, 2024 11:01 AM Burnard DEL wrote: Medication: tamsulosin  (FLOMAX ) 0.4 MG CAPS capsule  Has the patient contacted their pharmacy? Yes (Agent: If no, request that the patient contact the pharmacy for the refill. If patient does not wish to contact the pharmacy document the reason why and proceed with request.) (Agent: If yes, when and what did the pharmacy advise?)  This is the patient's preferred pharmacy:    [facility] - [place] [address] [place] [address] Phone: [phone] Fax: [phone]  Phone: [phone] Fax: [phone]  Is this the correct pharmacy for this prescription? Yes If no, delete pharmacy and type the correct one.   Has the prescription been filled recently? No  Is the patient out of the medication? Yes  Has the patient been seen for an appointment in the last year OR does the patient have an upcoming appointment? Yes  Can we respond through MyChart? No  Agent: Please be advised that Rx refills may take up to 3 business days. We ask that you follow-up with your pharmacy.

## 2024-04-29 NOTE — Telephone Encounter (Signed)
 Copied from CRM [id number]. Topic: Clinical - Medication Refill >> Apr 29, 2024  1:23 PM Chiquita SQUIBB wrote: Medication: pantoprazole  (PROTONIX ) 40 MG tablet [[id number]]- Pharmacy states it should be 20 mg  furosemide  (LASIX ) 40 MG tablet [[id number]]  Has the patient contacted their pharmacy? Yes- pharmacy calling in  (Agent: If no, request that the patient contact the pharmacy for the refill. If patient does not wish to contact the pharmacy document the reason why and proceed with request.) (Agent: If yes, when and what did the pharmacy advise?)  This is the patient's preferred pharmacy:   [facility] - [place] - [address] [place] [address] Phone: [phone] Fax: [phone]   Is this the correct pharmacy for this prescription? Yes If no, delete pharmacy and type the correct one.   Has the prescription been filled recently? No  Is the patient out of the medication? Yes  Has the patient been seen for an appointment in the last year OR does the patient have an upcoming appointment? Yes  Can we respond through MyChart? No  Agent: Please be advised that Rx refills may take up to 3 business days. We ask that you follow-up with your pharmacy.

## 2024-05-01 ENCOUNTER — Other Ambulatory Visit: Payer: Self-pay | Admitting: Family Medicine

## 2024-05-01 ENCOUNTER — Other Ambulatory Visit: Payer: Self-pay | Admitting: Internal Medicine

## 2024-05-01 DIAGNOSIS — G4700 Insomnia, unspecified: Secondary | ICD-10-CM

## 2024-05-01 DIAGNOSIS — F419 Anxiety disorder, unspecified: Secondary | ICD-10-CM

## 2024-05-02 NOTE — Telephone Encounter (Signed)
 It seems like she has a refill available at her pharmacy. Thanks, BJ

## 2024-05-03 ENCOUNTER — Other Ambulatory Visit: Payer: Self-pay | Admitting: Family Medicine

## 2024-05-03 DIAGNOSIS — K219 Gastro-esophageal reflux disease without esophagitis: Secondary | ICD-10-CM

## 2024-05-05 ENCOUNTER — Telehealth: Payer: Self-pay | Admitting: *Deleted

## 2024-05-05 NOTE — Telephone Encounter (Signed)
 I called and spoke with the pt  She states she she has the budesonide  nebs and also duonebs at home  She thought they were all the same thing She says she alternates them prn and they both seem to help  She looked at her box the budesonide  came in, and noticed that it says she should be taking it bid  She would like to start using it this way if Dr Neysa recommends this  Additionally, she is using her trelegy daily as maintenance Please advise, thanks!  Allergies  Allergen Reactions   Sinequan  [Doxepin ] Other (See Comments)    Kept her awake    Sulfa Antibiotics Swelling   Sulfur     Other reaction(s): Unknown

## 2024-05-05 NOTE — Telephone Encounter (Signed)
 Unless she feels that budesonide  nebulization helps and she wants it, it would suggest she try staying off it- just one less medicine.

## 2024-05-05 NOTE — Telephone Encounter (Signed)
 Returned call to [facility] re: clarification on inhaled medications.Spoke with Angie pharmacist. [facility]  Dr. Neysa please is patient currently taking both budesonide  and ipratropium-albuterol ? If so, Budesonide  rx- need new script

## 2024-05-06 NOTE — Telephone Encounter (Signed)
 Ok to maintain refills for her Trelegy, her budesonide  neb solution, and her Duoneb (ipratropium-albuterol ) neb solution.

## 2024-05-07 MED ORDER — BUDESONIDE 0.5 MG/2ML IN SUSP: 0.5000 mg | RESPIRATORY_TRACT | 10 refills | Status: DC | PRN

## 2024-05-07 NOTE — Telephone Encounter (Signed)
 Budesonide  rx sent to [facility] Patient notified nfn.

## 2024-05-11 ENCOUNTER — Telehealth: Payer: Self-pay | Admitting: Internal Medicine

## 2024-05-11 NOTE — Telephone Encounter (Signed)
 I called the pharmacy and notified that per CY budesonide  is daily as needed  Nothing further needed

## 2024-05-11 NOTE — Telephone Encounter (Signed)
 Copied from CRM [id number]. Topic: General - Other >> May 11, 2024 11:04 AM Shona S wrote: Reason for CRM: pharmacy is calling to get the frequency and or maxium daily dosage of budesinide prescription. [facility] Phone: [phone]

## 2024-05-23 ENCOUNTER — Other Ambulatory Visit: Payer: Self-pay | Admitting: Family Medicine

## 2024-06-03 ENCOUNTER — Other Ambulatory Visit: Payer: Self-pay | Admitting: Gastroenterology

## 2024-06-09 ENCOUNTER — Other Ambulatory Visit: Payer: Self-pay

## 2024-06-09 DIAGNOSIS — F419 Anxiety disorder, unspecified: Secondary | ICD-10-CM

## 2024-06-09 DIAGNOSIS — G4700 Insomnia, unspecified: Secondary | ICD-10-CM

## 2024-06-09 MED ORDER — LORAZEPAM 2 MG PO TABS
2.0000 mg | ORAL_TABLET | Freq: Every day | ORAL | 3 refills | Status: DC
Start: 2024-06-09 — End: 2024-09-03

## 2024-06-09 NOTE — Telephone Encounter (Signed)
 Rx was not discontinued, just needs to be refilled. Pended & sent to PCP.

## 2024-06-09 NOTE — Telephone Encounter (Unsigned)
 Copied from CRM #[id number]. Topic: Clinical - Prescription Issue >> Jun 09, 2024 12:46 PM [name] wrote: Reason for CRM: Patient has been taking Lorazapam for 50 years and was advised by [facility] was discontinued and she will need to contact her doctor to have something else called in. Patient has one more left and can't sleep without it.

## 2024-06-10 ENCOUNTER — Other Ambulatory Visit: Payer: Self-pay | Admitting: Family Medicine

## 2024-06-10 DIAGNOSIS — E876 Hypokalemia: Secondary | ICD-10-CM

## 2024-06-11 DIAGNOSIS — J449 Chronic obstructive pulmonary disease, unspecified: Secondary | ICD-10-CM | POA: Diagnosis not present

## 2024-06-11 DIAGNOSIS — R0602 Shortness of breath: Secondary | ICD-10-CM | POA: Diagnosis not present

## 2024-06-11 DIAGNOSIS — J9601 Acute respiratory failure with hypoxia: Secondary | ICD-10-CM | POA: Diagnosis not present

## 2024-06-11 DIAGNOSIS — G4733 Obstructive sleep apnea (adult) (pediatric): Secondary | ICD-10-CM | POA: Diagnosis not present

## 2024-06-17 NOTE — Progress Notes (Signed)
 HPI F Smoker(50 pkyrs) followed for OSA, Insomnia, complicated by  sCHF, CAD, Chronic DVT R lower leg, Mesenteric Ischemia, HTN, PAD, COPD( GOLD III)/ Dr Geronimo,  Chronic Respiratory Failure with Hypoxia, GERD, Hemoptysis, Hypothyroid,  Hx R Breast Cancer,  NPSG  07/27/20- AHI 26.5/ hr, desaturation to 84%, requiring 1L O2 (Nocturnal Hypoxemia),   Walk Test 12/24/2022 room air-minimum saturation 95%, maximum heart rate 102 HST 12/25/22- AHI 12.1/hr, deat to 80%/ avg 88%, body weight 118 lbs O2 sat at rest on room air 09/16/23- 93% O2 sat POC qualifying 09/16/23- 1 lap- HR 94/min, desat to 88%. On 2L POC walking O2 sat 94%.  =====================================================================================   01/24/24-78 yoF Smoker(12.5 pkyrs) followed for OSA, Insomnia, complicated by  sdCHF, CAD, Chronic DVT R lower leg, Mesenteric Ischemia, HTN, PAD, COPD( GOLD III)/ Dr Geronimo,  Chronic Respiratory Failure with Hypoxia, GERD, Hemoptysis, Hypothyroid,  Hx R Breast Cancer,  L Subclavian Steal,                               Patient is hard of hearing. Husband is here. 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  Hosp in Feb with acute on chronic respiratory failure due to COPD exacerbation. HST last year AHI 12.1/hr Complains O2 concentrator is loud and hot. We discussed alternatives to CPAP and will refer to Orthodontics to consider oral appliance. [employer] suggested to discuss Od delivery options. [facility] to refit CPAP mask for comfort Discussed the use of AI scribe software for clinical note transcription with the patient, who gave verbal consent to proceed.  History of Present Illness   The patient, with a  history of COPD and sleep apnea, reports that her breathing has improved since her hospitalization in February. She uses oxygen  at 2 liters while sleeping and occasionally during the day when she feels unwell. The patient expresses dissatisfaction with her current oxygen  concentrator due to its noise and heat output. She also discusses her struggle with using the CPAP machine for sleep apnea, citing discomfort with the mask and difficulty adjusting it. The patient has tried different masks but has not found a suitable one yet. She also mentions that she has not been able to use the CPAP consistently.    CT chest r/o PE 12/08/23 IMPRESSION: 1. No evidence of arterial embolism. Slight cardiomegaly. No edema. 2. Interval decreased caliber of the central airways which could be due to respiration or bronchospasm, with interval increased diffuse bronchial thickening and scattered subsegmental bronchial plugging. Findings consistent with worsening bronchitis. No focal consolidation. 3. Emphysema. 4. Aortic and coronary artery atherosclerosis. 5. Stable slightly prominent hilar lymph nodes. 6. Hiatal hernia. 7. Hepatic steatosis. Aortic Atherosclerosis (ICD10-I70.0) and Emphysema (ICD10-J43.9).   Assessment and Plan:    Chronic Obstructive Pulmonary Disease (COPD) Managed with home oxygen  therapy. Current equipment issues with noise and heat. Not under Dr. Reeves care for COPD. Concerns about [facility]'s responsiveness. - Order replacement oxygen  concentrator from [facility]. - Provide information about [employer] for alternative concentrators. - Advise to contact clinic if [facility]  is unresponsive.  Portable Oxygen  Therapy Non-functional battery in portable concentrator. Need solution for travel oxygen . [employer] suggested as provider. - Explore options with [employer] for portable concentrator.  Sleep Apnea Mild sleep apnea with AHI of 12. Discomfort with CPAP nasal pillows.  Discussed alternatives like fitted mouthpiece. Insurance limitations noted. - Request [facility] to refit CPAP mask and provide consultation. - Refer to orthodontist Oneil Forget for oral appliance evaluation. - Discuss insurance coverage with Oneil Forget regarding oral appliance.     06/18/24- 78 yoF Smoker(12.5 pkyrs) followed for OSA, Insomnia, complicated by  sdCHF, CAD, Chronic DVT R lower leg, Mesenteric Ischemia, HTN, PAD, COPD( GOLD III)/ Dr Geronimo,  Chronic Respiratory Failure with Hypoxia, GERD, Hemoptysis, Hypothyroid,  Hx R Breast Cancer,  L Subclavian Steal,                               Patient is hard of hearing. Husband is here. 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  Hosp in Feb with acute on chronic respiratory failure due to COPD exacerbation. At [facility] we asked [facility] to refit CPAP mask and we referred to [facility]/ OAP.  Suggested she explore POC for travel with [employer]. Discussed the use of AI scribe software for clinical note transcription with the patient, who gave verbal consent to proceed.  History of Present Illness   [name] is a 78 year old female who presents with persistent cough and wheezing.  She experiences a persistent cough with clear sputum, which has shown some recent improvement. Wheezing is noted during coughing, audible with a stethoscope, though she does not feel it herself. Her oxygen  saturation drops to 87% when descending stairs in the morning, improving to 92% after rest. She frequently monitors her oxygen  levels, which remain stable except during exertion. She has been without her CPAP machine for two to three months due to insurance issues, previously feeling better  with its use despite nasal soreness. Her current medications include an albuterol  inhaler, a nebulizer machine, budesonide , Dupixent , Duoneb, and Trelegy, with increased use during the summer months.     Assessment and Plan:    COPD with acute exacerbation and chronic cough with sputum production Chronic cough with clear sputum and wheezing. Wheezing evident on examination. - Order chest x-ray. - Prescribe prednisone  taper. - Continue albuterol  inhaler, nebulizer, budesonide , Dupixent , Duoneb, and Trelegy.  Hypoxemia Intermittent hypoxemia with exertion, oxygen  levels drop to 87% but recover to 92% at rest.  Obstructive sleep apnea, currently untreated Without CPAP due to insurance issues. Previous CPAP use improved sleep but caused nasal soreness. Awaiting new equipment eligibility in October. - Investigate insurance communication regarding CPAP machine.      ROS-see HPI   + = positive Constitutional:    +weight loss, night sweats, fevers, chills, fatigue, lassitude. HEENT:    headaches, difficulty swallowing, tooth/dental problems, sore throat,       Sneezing,+ itching, ear ache, nasal congestion, post nasal drip, snoring CV:    chest pain, orthopnea, PND, swelling in lower extremities, anasarca,  dizziness, palpitations Resp:   +shortness of breath with exertion or at rest.                +productive cough,   non-productive cough, +coughing up of blood.              change in color of mucus.  wheezing.   Skin:    +rash or lesions. GI:  + heartburn, +indigestion, abdominal pain, nausea, vomiting, diarrhea,                 change in bowel habits, loss of appetite GU: dysuria, change in color of urine, no urgency or frequency.   flank pain. MS:   joint pain, stiffness, decreased range of motion, back pain. Neuro-     nothing unusual Psych:  change in mood or affect.  +depression or anxiety.   memory loss.  OBJ- Physical Exam General- Alert,  Oriented, Affect-appropriate, Distress- none acute, + thin/frail, + wheelchair Skin- rash-none, lesions- none, excoriation- none Lymphadenopathy- none Head- atraumatic            Eyes- Gross vision intact, PERRLA, conjunctivae and secretions clear            Ears- Hearing, canals-normal            Nose- Clear, no-Septal dev, mucus, polyps, erosion, perforation             Throat- Mallampati II , mucosa clear , drainage- none, tonsils- atrophic Neck- flexible , trachea midline, no stridor , thyroid  nl, carotid no bruit Chest - symmetrical excursion , unlabored           Heart/CV- RRR , no murmur , no gallop  , no rub, nl s1 s2                           - JVD- none , edema- none, stasis changes- none, varices- none           Lung- + decreased, no rhonchi, wheeze- none, cough+ dry, dullness-none, rub- none           Chest wall-  Abd-  Br/ Gen/ Rectal- Not done, not indicated Extrem- cyanosis- none, clubbing, none, atrophy- none, strength- nl Neuro- grossly intact to observation

## 2024-06-18 ENCOUNTER — Encounter: Payer: Self-pay | Admitting: Internal Medicine

## 2024-06-18 ENCOUNTER — Ambulatory Visit

## 2024-06-18 ENCOUNTER — Ambulatory Visit: Admitting: Internal Medicine

## 2024-06-18 VITALS — BP 128/76 | HR 60 | Temp 97.8°F | Ht 65.0 in | Wt 134.0 lb

## 2024-06-18 DIAGNOSIS — J441 Chronic obstructive pulmonary disease with (acute) exacerbation: Secondary | ICD-10-CM

## 2024-06-18 DIAGNOSIS — J449 Chronic obstructive pulmonary disease, unspecified: Secondary | ICD-10-CM | POA: Diagnosis not present

## 2024-06-18 MED ORDER — PREDNISONE 10 MG PO TABS: ORAL_TABLET | ORAL | 0 refills | Status: AC

## 2024-06-18 NOTE — Patient Instructions (Signed)
 Order- CXR    dx COPD exacerbation  Script sent to add prednisone  taper to your current breathing meds

## 2024-06-21 ENCOUNTER — Encounter: Payer: Self-pay | Admitting: Internal Medicine

## 2024-06-22 DIAGNOSIS — J449 Chronic obstructive pulmonary disease, unspecified: Secondary | ICD-10-CM | POA: Diagnosis not present

## 2024-06-29 ENCOUNTER — Ambulatory Visit (INDEPENDENT_AMBULATORY_CARE_PROVIDER_SITE_OTHER): Admitting: Family Medicine

## 2024-06-29 ENCOUNTER — Encounter: Payer: Self-pay | Admitting: Family Medicine

## 2024-06-29 ENCOUNTER — Ambulatory Visit: Payer: Self-pay | Admitting: Family Medicine

## 2024-06-29 VITALS — BP 126/76 | HR 67 | Temp 98.2°F | Resp 16 | Ht 65.0 in | Wt 138.2 lb

## 2024-06-29 DIAGNOSIS — E038 Other specified hypothyroidism: Secondary | ICD-10-CM | POA: Diagnosis not present

## 2024-06-29 DIAGNOSIS — Z23 Encounter for immunization: Secondary | ICD-10-CM | POA: Diagnosis not present

## 2024-06-29 DIAGNOSIS — G4700 Insomnia, unspecified: Secondary | ICD-10-CM | POA: Diagnosis not present

## 2024-06-29 DIAGNOSIS — R748 Abnormal levels of other serum enzymes: Secondary | ICD-10-CM

## 2024-06-29 DIAGNOSIS — E538 Deficiency of other specified B group vitamins: Secondary | ICD-10-CM

## 2024-06-29 DIAGNOSIS — E039 Hypothyroidism, unspecified: Secondary | ICD-10-CM

## 2024-06-29 DIAGNOSIS — J441 Chronic obstructive pulmonary disease with (acute) exacerbation: Secondary | ICD-10-CM

## 2024-06-29 DIAGNOSIS — F419 Anxiety disorder, unspecified: Secondary | ICD-10-CM

## 2024-06-29 LAB — HEPATIC FUNCTION PANEL
ALT: 19 U/L (ref 0–35)
AST: 24 U/L (ref 0–37)
Albumin: 4.4 g/dL (ref 3.5–5.2)
Alkaline Phosphatase: 81 U/L (ref 39–117)
Bilirubin, Direct: 0.1 mg/dL (ref 0.0–0.3)
Total Bilirubin: 0.5 mg/dL (ref 0.2–1.2)
Total Protein: 7.6 g/dL (ref 6.0–8.3)

## 2024-06-29 LAB — VITAMIN B12: Vitamin B-12: 177 pg/mL — ABNORMAL LOW (ref 211–911)

## 2024-06-29 LAB — TSH: TSH: 15.99 u[IU]/mL — ABNORMAL HIGH (ref 0.35–5.50)

## 2024-06-29 LAB — T4, FREE: Free T4: 0.98 ng/dL (ref 0.60–1.60)

## 2024-06-29 LAB — GAMMA GT: GGT: 52 U/L — ABNORMAL HIGH (ref 7–51)

## 2024-06-29 NOTE — Assessment & Plan Note (Signed)
 Currently she is not on B12 supplementation. B12 in 02/2024 was > 1500. Further recommendation will be given according to B12 result.

## 2024-06-29 NOTE — Assessment & Plan Note (Addendum)
 Problem is stable. She has tolerated medication well. Continue paroxetine  20 mg daily and lorazepam  2 mg at bedtime. She is also on trazodone  100 mg at bedtime to treat insomnia. We discussed some side effects of medications. Follow-up in 5 to 6 months, before if needed.

## 2024-06-29 NOTE — Assessment & Plan Note (Signed)
 Problem has not been adequately controlled, last TSH on 04/22/2024 was 21.7. Instructed to take levothyroxine  45 to 60 minutes before breakfast and with empty stomach. Today TSH and free T4 checked.

## 2024-06-29 NOTE — Patient Instructions (Signed)
 A few things to remember from today's visit:  Other specified hypothyroidism - Plan: T4, free, TSH  Anxiety disorder, unspecified type  Insomnia, unspecified type  B12 deficiency - Plan: Vitamin B12  Elevated alkaline phosphatase level - Plan: Hepatic function panel, Gamma GT  No changes today. Take Levothyroxine , thyroid  med, 45-60 min before breakfast in empty stomach and with no other medication.  If you need refills for medications you take chronically, please call your pharmacy. Do not use My Chart to request refills or for acute issues that need immediate attention. If you send a my chart message, it may take a few days to be addressed, specially if I am not in the office.  Please be sure medication list is accurate. If a new problem present, please set up appointment sooner than planned today.

## 2024-06-29 NOTE — Progress Notes (Signed)
 Chief Complaint  Patient presents with   Medical Management of Chronic Issues   Discussed the use of AI scribe software for clinical note transcription with the patient, who gave verbal consent to proceed.  History of Present Illness [name] is a 78 year old female with PMHx significant for CHF, CAD, PAD, severe COPD, insomnia, hypothyroidism, bladder cancer, breast cancer, anxiety, depression, prediabetes, and HLD , who is here today for chronic disease management.  Since her last visit she has seen pulmonologist a few times due to poorly controlled COPD. According to patient, her pulmonology recommended prednisone  because of lung congestion noted on examination, she just completed course recently. Cough, dyspnea, and wheezing otherwise stable. Next appointment with pulmonologist on 08/20/2024.  Anxiety and insomnia: She is taking paroxetine  20 mg daily,lorazepam  2 mg daily at bedtime for anxiety, and trazodone  100 mg at bedtime for sleep. She reports that her sleep has improved, averaging eight to nine hours per night, with no reported side effects from these medications.  Hypothyroidism: She takes levothyroxine  112 mcg daily x 5 and 1/2 tab Tuesdays and Thursdays. She takes the medication with breakfast at 9 AM.  No tremor,palpitations, or heat/cold intolerance are reported. Lab Results  Component Value Date   TSH 21.79 (H) 04/22/2024   She follows with her urologist regularly, she reports that a new bladder lesion was identified during her last follow-up. She has an upcoming urologist appointment next month. She takes Flomax  0.4 mg daily.  B12 deficiency: She received B12 injections until a few months ago. Currently she is not on B12 supplementation. Lab Results  Component Value Date   VITAMINB12 >1500 (H) 02/25/2024   Alkaline phosphatase has been mildly elevated. No recent falls and regular bowel movements are reported.  Lab Results  Component Value Date   ALT  28 02/25/2024   AST 29 02/25/2024   ALKPHOS 118 (H) 02/25/2024   BILITOT 0.5 02/25/2024   Review of Systems  Constitutional:  Positive for fatigue. Negative for activity change, appetite change, chills and fever.  HENT:  Negative for mouth sores and sore throat.   Cardiovascular:  Negative for chest pain, palpitations and leg swelling.  Gastrointestinal:  Negative for abdominal pain, nausea and vomiting.  Genitourinary:  Negative for decreased urine volume, dysuria and hematuria.  Musculoskeletal:  Positive for arthralgias and gait problem.  Neurological:  Negative for syncope and facial asymmetry.  Psychiatric/Behavioral:  Negative for confusion and hallucinations.   See other pertinent positives and negatives in HPI.  Current Outpatient Medications on File Prior to Visit  Medication Sig Dispense Refill   acetaminophen  (ACETAMINOPHEN  8 HOUR) 650 MG CR tablet Take 1,300 mg by mouth every 8 (eight) hours as needed for pain.     albuterol  (VENTOLIN  HFA) 108 (90 Base) MCG/ACT inhaler INHALE TWO PUFFS BY MOUTH EVERY 6 HOURS AS NEEDED FOR WHEEZE OR SHORTNESS OF BREATH 18 g 7   ammonium lactate (AMLACTIN DAILY) 12 % lotion Apply 1 Application topically daily.     aspirin  EC 81 MG tablet Take 1 tablet (81 mg total) by mouth daily. 30 tablet    atorvastatin  (LIPITOR ) 80 MG tablet TAKE ONE TABLET (80 MG TOTAL) BY MOUTH DAILY AT 5PM 90 tablet 2   budesonide  (PULMICORT ) 0.5 MG/2ML nebulizer solution Take 2 mLs (0.5 mg total) by nebulization as needed (SOB, wheezing). DX:J96.11 120 mL 10   cilostazol  (PLETAL ) 100 MG tablet TAKE 1 TABLET BY MOUTH TWICE  DAILY 30 tablet 0   clopidogrel  (  PLAVIX ) 75 MG tablet TAKE ONE TABLET BY MOUTH DAILY AT 9AM 90 tablet 5   DUPIXENT  300 MG/2ML SOPN Inject 300 mg into the skin every 14 (fourteen) days.     ezetimibe  (ZETIA ) 10 MG tablet Take 1 tablet (10 mg total) by mouth daily. 90 tablet 1   ferrous sulfate  (FEROSUL) 325 (65 FE) MG tablet Take 1 tablet (325 mg total)  by mouth daily with breakfast. 90 tablet 2   fluticasone  (FLONASE ) 50 MCG/ACT nasal spray SPRAY 1 SPRAY IN EACH NOSTRIL TWICE DAILY 48 g 2   furosemide  (LASIX ) 40 MG tablet Take 1 tablet (40 mg total) by mouth 2 (two) times daily. 200 tablet 2   hydrOXYzine  (ATARAX ) 25 MG tablet Take 25 mg by mouth at bedtime.     ipratropium-albuterol  (DUONEB) 0.5-2.5 (3) MG/3ML SOLN INHALE THE CONTENTS OF 1 VIAL VIA NEBULIZER THREE TIMES DAILY AS DIRECTED FOR SEVEN DAYS, THEN EVERY 6 HOURS AS NEEDED 360 mL 11   levothyroxine  (SYNTHROID ) 112 MCG tablet TAKE 1/2 TABLET BY MOUTH TUESDAY AND THURSDAY and TAKE ONE TABLET BY MOUTH ON MONDAY, WEDNESDAY, FRIDAY, SATURDAY & SUNDAY 86 tablet 11   LORazepam  (ATIVAN ) 2 MG tablet Take 1 tablet (2 mg total) by mouth at bedtime. 30 tablet 3   montelukast  (SINGULAIR ) 10 MG tablet TAKE 1 TABLET BY MOUTH EVERYDAY AT BEDTIME 90 tablet 1   pantoprazole  (PROTONIX ) 40 MG tablet TAKE 1 TABLET BY MOUTH EVERY DAY 90 tablet 1   PARoxetine  (PAXIL ) 20 MG tablet TAKE ONE TABLET BY MOUTH DAILY AT 9AM 90 tablet 2   potassium chloride  SA (KLOR-CON  M) 20 MEQ tablet TAKE 1 TABLET BY MOUTH DAILY 100 tablet 2   tamsulosin  (FLOMAX ) 0.4 MG CAPS capsule TAKE 1 CAPSULE BY MOUTH DAILY 100 capsule 2   traZODone  (DESYREL ) 100 MG tablet TAKE ONE TABLET BY MOUTH DAILY AT 9PM AT BEDTIME 90 tablet 2   TRELEGY ELLIPTA  200-62.5-25 MCG/ACT AEPB INHALE 1 PUFF BY MOUTH ONCE DAILY (RINSE MOUTH AFTER EACH USE) 60 each 11   Carboxymethylcellulose Sodium (THERATEARS) 0.25 % SOLN Place 2 drops into both eyes as needed (Dry eyes). (Patient not taking: Reported on 06/29/2024)     clobetasol (TEMOVATE) 0.05 % external solution Apply 1 Application topically 2 (two) times daily. Apply to scalp for seborrhea. (Patient not taking: Reported on 06/29/2024)     predniSONE  (DELTASONE ) 10 MG tablet 4 X 2 DAYS, 3 X 2 DAYS, 2 X 2 DAYS, 1 X 2 DAYS (Patient not taking: Reported on 06/29/2024) 20 tablet 0   No current  facility-administered medications on file prior to visit.   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)    05/ 2018  chronic non-occlusive DVT RLE   Chronic kidney disease    COPD, frequent exacerbations (HCC)    03-06-2018 per pt last exacerbation 12/ 2018   Coronary artery disease    cardiologist-  dr rolan-- 03-11-2018 er cath , 80% stenosis in the ostial second diagonal   Dyspnea on minimal exertion    Emphysema/COPD (HCC)    CAT score- 17   Family history of breast cancer    Family history of colon cancer    Family history of melanoma    Family history of ovarian cancer    Family history of  pancreatic cancer    Fibromyalgia 1995   GERD (gastroesophageal reflux disease)    Hiatal hernia    History of breast cancer    History of diverticulitis of colon    2002  s/p  sigmoid colectomy   History of multiple pulmonary nodules    hx RUL nodules x2  s/p right VATS w/ wedge resection 09-11-2005 and 06-14-2011  both  necrotizing granulomatous inflammation w/ cystic area of necrosis & focal calcification   History of right breast cancer oncologist-  dr layla-- no recurrence   dx 2010--  IDC, Stage IA , ER/PR+,  (pT1c pN0) 11-09-2008 right lumpectomy;  12-18-2008 right simple mastectomy for DCIS margins;  completed chemotherapy 2010 (no radiation) and completed antiestrogen therapy   Hx of colonic polyps    Dr. 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   Nocturia    OA (osteoarthritis)    thumbs   On supplemental oxygen  therapy    03-06-2018 per pt uses only at night,  checks O2 sats at home,  stated am sat 89% after moving around average 93-94% with RA   Osteoporosis    Peripheral arterial occlusive disease  (HCC) vascular-- dr chen/ dr sheree   proximal right SFA severe focal stenosis with collaterals from the PFA/  04/ 2012 occluded celiac and SMA arteries with distal reconstitution w/ patent IMA   Peripheral vascular disease (HCC)    chronic DVT RLE,  mesenteric vascular disease   Pneumonia    Right lower lobe pulmonary nodule    Chest CT 08-29-2017   Sleep apnea    Stage 3 severe COPD by GOLD classification (HCC) hx frequent exacerbations--   pulmologist-  dr rick--  per lov note , dated 12-20-2017, oxyogen 2L is prescribed for use with exertion (but pt only uses mostly at night), O2 sats on RA run the 80s , this day sat 86% RA and with 2L O2 sat 92%   Varicose veins of leg with swelling    varicose vein surgery - Dr. Catherine   Wears glasses    Wears hearing aid in both ears    Allergies  Allergen Reactions   Sinequan  [Doxepin ] Other (See Comments)    Kept her awake    Sulfa Antibiotics Swelling   Sulfur     Other reaction(s): Unknown    Social History   Socioeconomic History   Marital status: Married    Spouse name: Not on file   Number of children: 3   Years of education: 12   Highest education level: Not on file  Occupational History   Occupation: Magazine features editor: RETIRED    Comment: retired  Tobacco Use   Smoking status: Former    Current packs/day: 0.00    Average packs/day: 0.3 packs/day for 50.0 years (12.5 ttl pk-yrs)    Types: Cigarettes    Start date: 05/18/1966    Quit date: 05/18/2016    Years since quitting: 8.1   Smokeless tobacco: Never  Vaping Use   Vaping status: Never Used  Substance and Sexual Activity   Alcohol use: Yes    Alcohol/week: 14.0 standard drinks of alcohol    Types: 14 Cans of beer per week    Comment: every night   Drug use: No   Sexual activity: Not on file  Other Topics Concern   Not on  file  Social History Narrative   ** Merged History Encounter **       HSG, beauty school. Married '69 -7 yrs/widowed; married  '79 - 62yr/divorced; married '87.  2 sons - '70, '74, 1 srtep-son; 1 grandchild. 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 SO - good health.  NO history of abuse.  ACP - full code and all heroic measures.    Social Drivers of Corporate investment banker Strain: Low Risk  (03/04/2024)   Overall Financial Resource Strain (CARDIA)    Difficulty of Paying Living Expenses: Not hard at all  Food Insecurity: No Food Insecurity (03/04/2024)   Hunger Vital Sign    Worried About Running Out of Food in the Last Year: Never true    Ran Out of Food in the Last Year: Never true  Transportation Needs: No Transportation Needs (03/04/2024)   PRAPARE - Administrator, Civil Service (Medical): No    Lack of Transportation (Non-Medical): No  Physical Activity: Inactive (03/04/2024)   Exercise Vital Sign    Days of Exercise per Week: 0 days    Minutes of Exercise per Session: 0 min  Stress: No Stress Concern Present (03/04/2024)   [employer] of Occupational Health - Occupational Stress Questionnaire    Feeling of Stress : Not at all  Social Connections: Moderately Isolated (03/04/2024)   Social Connection and Isolation Panel    Frequency of Communication with Friends and Family: More than three times a week    Frequency of Social Gatherings with Friends and Family: More than three times a week    Attends Religious Services: Never    Database administrator or Organizations: No    Attends Banker Meetings: Never    Marital Status: Married   Vitals:   06/29/24 1356  BP: 126/76  Pulse: 67  Resp: 16  Temp: 98.2 F (36.8 C)  SpO2: 90%   Body mass index is 23 kg/m.  Physical Exam Vitals and nursing note reviewed.  Constitutional:      General: She is not in acute distress.    Appearance: She is well-developed and well-groomed.  HENT:     Head: Normocephalic and atraumatic.  Eyes:     Conjunctiva/sclera: Conjunctivae normal.  Cardiovascular:     Rate and Rhythm: Normal rate  and regular rhythm.     Heart sounds: No murmur heard.    Comments: DP pulses palpable. Pulmonary:     Effort: Pulmonary effort is normal. No respiratory distress.     Breath sounds: Transmitted upper airway sounds present. Rhonchi present. No wheezing or rales.  Abdominal:     Palpations: Abdomen is soft. There is no mass.     Tenderness: There is no abdominal tenderness.  Musculoskeletal:     Right lower leg: No edema.     Left lower leg: No edema.  Skin:    General: Skin is warm.     Findings: No erythema or rash.  Neurological:     General: No focal deficit present.     Mental Status: She is alert and oriented to person, place, and time.     Gait: Gait normal.     Comments: Mildly unstable gait, not assisted.  Psychiatric:        Mood and Affect: Mood and affect normal.   ASSESSMENT AND PLAN:  [name] was seen today for medical management of chronic issues.  Diagnoses and all orders for this visit:  Orders Placed  This Encounter  Procedures   T4, free   TSH   Vitamin B12   Hepatic function panel   Gamma GT   Lab Results  Component Value Date   TSH 15.99 (H) 06/29/2024   Lab Results  Component Value Date   VITAMINB12 177 (L) 06/29/2024   Lab Results  Component Value Date   ALT 19 06/29/2024   AST 24 06/29/2024   ALKPHOS 81 06/29/2024   BILITOT 0.5 06/29/2024   Other specified hypothyroidism Assessment & Plan: Problem has not been adequately controlled, last TSH on 04/22/2024 was 21.7. Instructed to take levothyroxine  45 to 60 minutes before breakfast and with empty stomach. Today TSH and free T4 checked.  Orders: -     T4, free; Future -     TSH; Future  Anxiety disorder, unspecified type Assessment & Plan: Problem is stable. She has tolerated medication well. Continue paroxetine  20 mg daily and lorazepam  2 mg at bedtime. She is also on trazodone  100 mg at bedtime to treat insomnia. We discussed some side effects of medications. Follow-up  in 5 to 6 months, before if needed.   Insomnia, unspecified type Assessment & Plan: She reports the problem has improved since her last follow-up. Currently on Trazodone  100 mg at bedtime and Lorazepam  2 mg at bedtime. We discussed son side effects of medications and the risk of interaction among them.   B12 deficiency Assessment & Plan: Currently she is not on B12 supplementation. B12 in 02/2024 was > 1500. Further recommendation will be given according to B12 result.  Orders: -     Vitamin B12; Future  COPD, frequent exacerbations (HCC) Assessment & Plan: Problem is not well controlled but otherwise stable. Following with pulmonologist regularly. Recently completed course of Prednisone . She has appt with pulmonologist 08/2024.   Elevated alkaline phosphatase level -     Hepatic function panel; Future -     Gamma GT; Future  Influenza vaccine needed -     Flu vaccine HIGH DOSE PF(Fluzone Trivalent)  Return in about 5 months (around 12/04/2024) for chronic problems.  Chaun Uemura G. [place], MD  [facility]. [place] office.

## 2024-06-29 NOTE — Assessment & Plan Note (Addendum)
 Problem is not well controlled but otherwise stable. Following with pulmonologist regularly. Recently completed course of Prednisone . She has appt with pulmonologist 08/2024.

## 2024-06-29 NOTE — Assessment & Plan Note (Signed)
 She reports the problem has improved since her last follow-up. Currently on Trazodone  100 mg at bedtime and Lorazepam  2 mg at bedtime. We discussed son side effects of medications and the risk of interaction among them.

## 2024-06-30 ENCOUNTER — Other Ambulatory Visit: Payer: Self-pay | Admitting: Family Medicine

## 2024-06-30 DIAGNOSIS — F419 Anxiety disorder, unspecified: Secondary | ICD-10-CM

## 2024-07-02 ENCOUNTER — Ambulatory Visit: Payer: Self-pay | Admitting: Internal Medicine

## 2024-07-06 ENCOUNTER — Other Ambulatory Visit: Payer: Self-pay | Admitting: Family Medicine

## 2024-07-06 ENCOUNTER — Other Ambulatory Visit: Payer: Self-pay | Admitting: Internal Medicine

## 2024-07-06 DIAGNOSIS — E039 Hypothyroidism, unspecified: Secondary | ICD-10-CM

## 2024-07-06 DIAGNOSIS — K219 Gastro-esophageal reflux disease without esophagitis: Secondary | ICD-10-CM

## 2024-07-06 NOTE — Telephone Encounter (Signed)
 Spoke with patient regarding results and Dr. Gib recommendations. Questions answered. Appointment made for 07/07/2024 at 2:00pm for first B12 injection. Patient voiced understanding.

## 2024-07-07 ENCOUNTER — Ambulatory Visit (INDEPENDENT_AMBULATORY_CARE_PROVIDER_SITE_OTHER)

## 2024-07-07 DIAGNOSIS — E538 Deficiency of other specified B group vitamins: Secondary | ICD-10-CM

## 2024-07-07 MED ORDER — CYANOCOBALAMIN 1000 MCG/ML IJ SOLN
1000.0000 ug | Freq: Once | INTRAMUSCULAR | Status: AC
  Administered 2024-07-07: 1000 ug via INTRAMUSCULAR

## 2024-07-07 NOTE — Progress Notes (Signed)
Per orders of Dr. Jordan, injection of Cyanocobalamin 1000 mcg given by Ayumi Wangerin L Miracle Mongillo. Patient tolerated injection well.  

## 2024-07-12 DIAGNOSIS — R0602 Shortness of breath: Secondary | ICD-10-CM | POA: Diagnosis not present

## 2024-07-12 DIAGNOSIS — J9601 Acute respiratory failure with hypoxia: Secondary | ICD-10-CM | POA: Diagnosis not present

## 2024-07-12 DIAGNOSIS — J449 Chronic obstructive pulmonary disease, unspecified: Secondary | ICD-10-CM | POA: Diagnosis not present

## 2024-07-13 ENCOUNTER — Other Ambulatory Visit: Payer: Self-pay | Admitting: Internal Medicine

## 2024-07-14 ENCOUNTER — Ambulatory Visit (INDEPENDENT_AMBULATORY_CARE_PROVIDER_SITE_OTHER): Admitting: *Deleted

## 2024-07-14 DIAGNOSIS — E538 Deficiency of other specified B group vitamins: Secondary | ICD-10-CM

## 2024-07-14 MED ORDER — CYANOCOBALAMIN 1000 MCG/ML IJ SOLN
1000.0000 ug | Freq: Once | INTRAMUSCULAR | Status: AC
  Administered 2024-07-14: 1000 ug via INTRAMUSCULAR

## 2024-07-14 NOTE — Progress Notes (Signed)
Per orders of Dr. Jordan, injection of Cyanocobalamin 1000mcg given by Maryjean Corpening A. Patient tolerated injection well.  

## 2024-07-22 ENCOUNTER — Other Ambulatory Visit: Payer: Self-pay | Admitting: Family Medicine

## 2024-07-22 DIAGNOSIS — E039 Hypothyroidism, unspecified: Secondary | ICD-10-CM

## 2024-07-22 DIAGNOSIS — J449 Chronic obstructive pulmonary disease, unspecified: Secondary | ICD-10-CM | POA: Diagnosis not present

## 2024-07-22 MED ORDER — LEVOTHYROXINE SODIUM 112 MCG PO TABS: ORAL_TABLET | ORAL | 11 refills | Status: DC

## 2024-07-22 NOTE — Telephone Encounter (Signed)
 Copied from CRM #[id number]. Topic: Clinical - Medication Refill >> Jul 22, 2024 12:16 PM Zy'onna H wrote: Medication: levothyroxine  (SYNTHROID ) 112 MCG tablet **Riley - [facility] called in on behalf of patient to refill the following Rx** Callback Number: [phone] **Requesting a New prescription because the old one doesn't have any directions**  Has the patient contacted their pharmacy? Yes (Agent: If no, request that the patient contact the pharmacy for the refill. If patient does not wish to contact the pharmacy document the reason why and proceed with request.) (Agent: If yes, when and what did the pharmacy advise?)  This is the patient's preferred pharmacy:  [facility] - [place] [address] [place] [address] Phone: [phone] Fax: [phone]    Is this the correct pharmacy for this prescription? Yes If no, delete pharmacy and type the correct one.   Has the prescription been filled recently? Yes  Is the patient out of the medication? Yes  Has the patient been seen for an appointment in the last year OR does the patient have an upcoming appointment? Yes  Can we respond through MyChart? No  Agent: Please be advised that Rx refills may take up to 3 business days. We ask that you follow-up with your pharmacy.

## 2024-07-22 NOTE — Telephone Encounter (Signed)
**  [name] - [facility] called in on behalf of patient to refill the following Rx** Callback Number: [phone] **Requesting a New prescription because the old one doesn't have any directions**

## 2024-07-26 ENCOUNTER — Other Ambulatory Visit: Payer: Self-pay | Admitting: Family Medicine

## 2024-07-26 DIAGNOSIS — I10 Essential (primary) hypertension: Secondary | ICD-10-CM

## 2024-07-26 DIAGNOSIS — E785 Hyperlipidemia, unspecified: Secondary | ICD-10-CM

## 2024-07-26 DIAGNOSIS — I2510 Atherosclerotic heart disease of native coronary artery without angina pectoris: Secondary | ICD-10-CM

## 2024-08-11 DIAGNOSIS — J449 Chronic obstructive pulmonary disease, unspecified: Secondary | ICD-10-CM | POA: Diagnosis not present

## 2024-08-11 DIAGNOSIS — J9601 Acute respiratory failure with hypoxia: Secondary | ICD-10-CM | POA: Diagnosis not present

## 2024-08-11 DIAGNOSIS — G4733 Obstructive sleep apnea (adult) (pediatric): Secondary | ICD-10-CM | POA: Diagnosis not present

## 2024-08-11 DIAGNOSIS — R0602 Shortness of breath: Secondary | ICD-10-CM | POA: Diagnosis not present

## 2024-08-15 ENCOUNTER — Other Ambulatory Visit: Payer: Self-pay | Admitting: Vascular Surgery

## 2024-08-18 ENCOUNTER — Telehealth: Payer: Self-pay | Admitting: Family Medicine

## 2024-08-18 NOTE — Telephone Encounter (Signed)
 Spoke with patient. Advised patient Dr. Sheree sent medication to requested pharmacy yesterday per chart review. Patient voiced understanding.

## 2024-08-18 NOTE — Telephone Encounter (Unsigned)
 Copied from CRM [id number]. Topic: Clinical - Medication Refill >> Aug 18, 2024 12:24 PM Victoria A wrote: Medication: clopidogrel  (PLAVIX ) 75 MG tablet  Has the patient contacted their pharmacy? Yes (Agent: If no, request that the patient contact the pharmacy for the refill. If patient does not wish to contact the pharmacy document the reason why and proceed with request.) (Agent: If yes, when and what did the pharmacy advise?)  This is the patient's preferred pharmacy:  [facility] - [place] - [address] [place] [address] Phone: [phone] Fax: [phone]   Is this the correct pharmacy for this prescription? Yes If no, delete pharmacy and type the correct one.   Has the prescription been filled recently? No  Is the patient out of the medication? No  Has the patient been seen for an appointment in the last year OR does the patient have an upcoming appointment? Yes  Can we respond through MyChart? No  Agent: Please be advised that Rx refills may take up to 3 business days. We ask that you follow-up with your pharmacy.

## 2024-08-18 NOTE — Telephone Encounter (Signed)
 Patient requesting Plavix . This RN sees that plavix  was called in yesterday by [facility].

## 2024-08-19 NOTE — Progress Notes (Signed)
 HPI F Smoker(50 pkyrs) followed for OSA, Insomnia, complicated by  sCHF, CAD, Chronic DVT R lower leg, Mesenteric Ischemia, HTN, PAD, COPD( GOLD III)/ Dr Geronimo,  Chronic Respiratory Failure with Hypoxia, GERD, Hemoptysis, Hypothyroid,  Hx R Breast Cancer,  NPSG  07/27/20- AHI 26.5/ hr, desaturation to 84%, requiring 1L O2 (Nocturnal Hypoxemia),   Walk Test 12/24/2022 room air-minimum saturation 95%, maximum heart rate 102 HST 12/25/22- AHI 12.1/hr, deat to 80%/ avg 88%, body weight 118 lbs O2 sat at rest on room air 09/16/23- 93% O2 sat POC qualifying 09/16/23- 1 lap- HR 94/min, desat to 88%. On 2L POC walking O2 sat 94%.  =====================================================================================    06/18/24- 78 yoF Smoker(12.5 pkyrs) followed for OSA, Insomnia, complicated by  sdCHF, CAD, Chronic DVT R lower leg, Mesenteric Ischemia, HTN, PAD, COPD( GOLD III)/ Dr Geronimo,  Chronic Respiratory Failure with Hypoxia, GERD, Hemoptysis, Hypothyroid,  Hx R Breast Cancer,  L Subclavian Steal,                               Patient is hard of hearing. Husband is here. 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  Hosp in Feb with acute on chronic respiratory failure due to COPD exacerbation. At LOV we asked [employer] to refit CPAP mask and we referred to [facility]/ OAP.  Suggested she explore POC for travel with [employer]. Discussed the use of AI scribe software for clinical note transcription with the patient, who gave verbal consent to proceed.  History of Present Illness   [name] is a 78 year old female who presents with persistent cough and wheezing.  She experiences a persistent cough with clear sputum,  which has shown some recent improvement. Wheezing is noted during coughing, audible with a stethoscope, though she does not feel it herself. Her oxygen  saturation drops to 87% when descending stairs in the morning, improving to 92% after rest. She frequently monitors her oxygen  levels, which remain stable except during exertion. She has been without her CPAP machine for two to three months due to insurance issues, previously feeling better with its use despite nasal soreness. Her current medications include an albuterol  inhaler, a nebulizer machine, budesonide , Dupixent , Duoneb, and Trelegy, with increased use during the summer months.     Assessment and Plan:    COPD with acute exacerbation and chronic cough with sputum production Chronic cough with clear sputum and wheezing. Wheezing evident on examination. - Order chest x-ray. - Prescribe prednisone  taper. - Continue albuterol  inhaler, nebulizer, budesonide , Dupixent , Duoneb, and Trelegy.  Hypoxemia Intermittent hypoxemia with exertion, oxygen  levels drop to 87% but recover to 92% at rest.  Obstructive sleep apnea, currently untreated Without CPAP due to insurance issues. Previous CPAP use improved sleep but caused nasal soreness. Awaiting new equipment eligibility in October. - Investigate insurance communication regarding CPAP machine.    08/20/24- 78 yoF Smoker(12.5 pkyrs) followed for OSA, Insomnia, complicated by  sdCHF, CAD, Chronic DVT R lower leg, Mesenteric Ischemia, HTN, PAD, COPD( GOLD III)/ Dr Geronimo,  Chronic Respiratory Failure with Hypoxia, GERD, Hemoptysis, Hypothyroid,  Hx R Breast Cancer,  L Subclavian Steal,                               Patient is hard of hearing. Husband is here. 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                      O2  Adapt 2L  sleep  CPAP auto 5-15/ with O2/ [employer]-  Download  Body weight today-133 lbs Discussed the use of AI scribe software for clinical note transcription with the patient, who gave verbal consent to proceed.  History of Present Illness   [name] is a 78 year old female with chronic respiratory issues who presents with persistent cough and oxygen  desaturation. Husband  is here.  She experiences persistent coughing and hoarseness, especially in the mornings. Prednisone  eventually helps reduce her coughing, but some cough persists. Cough drops worsen her symptoms, so she has stopped using them.  Her oxygen  saturation levels drop to 91-92% during activities like walking, bending over, or lifting objects. She uses a portable oxygen  concentrator and wears oxygen  at night.  Her treatment regimen includes a nebulizer machine with medication and Dupixent  shots.   CXR 06/18/24 IMPRESSION: 1. No acute process. 2. Mild scarring in the upper right lung.       Assessment and Plan:    Chronic obstructive pulmonary disease with acute exacerbation Prednisone  reduced cough frequency. Wheezing present. Nebulizer treatments as needed. Dupixent  ongoing. Ohtuvayre  nebs considered pending financial support. - Use nebulizer as needed, up to every six hours. - Continue Dupixent  injections. - Apply for financial support for Ohtuvayre  for nebulizer treatment twice daily.  Chronic respiratory failure with hypoxia Oxygen  saturation 91-92% at rest, drops with exertion or coughing but resolves quickly. Portable and nocturnal oxygen  available. Previous walk test did not meet insurance criteria for portable oxygen . - Continue nocturnal oxygen  therapy. - Use portable oxygen  as needed during exertion or significant oxygen  saturation drops.     She is hard of hearing and I don't think she hears herself wheezing   ROS-see HPI   + = positive Constitutional:    +weight loss, night sweats, fevers, chills, fatigue,  lassitude. HEENT:    headaches, difficulty swallowing, tooth/dental problems, sore throat,       Sneezing,+ itching, ear ache, nasal congestion, post nasal drip, snoring CV:    chest pain, orthopnea, PND, swelling in lower extremities, anasarca,                                   dizziness, palpitations Resp:   +shortness of breath with exertion or at rest.                +productive cough,   non-productive cough, +coughing up of blood.              change in color of mucus.  wheezing.   Skin:    +rash or lesions. GI:  + heartburn, +indigestion, abdominal pain, nausea, vomiting, diarrhea,                 change in bowel habits, loss of appetite GU: dysuria, change in color of urine, no urgency or frequency.  flank pain. MS:   joint pain, stiffness, decreased range of motion, back pain. Neuro-     nothing unusual Psych:  change in mood or affect.  +depression or anxiety.   memory loss.  OBJ- Physical Exam General- Alert, Oriented, Affect-appropriate, Distress- none acute, + thin/frail,  Skin- rash-none, lesions- none, excoriation- none Lymphadenopathy- none Head- atraumatic            Eyes- Gross vision intact, PERRLA, conjunctivae and secretions clear            Ears- +Hearing aids/ HOH            Nose- Clear, no-Septal dev, mucus, polyps, erosion, perforation             Throat- Mallampati II , mucosa clear , drainage- none, tonsils- atrophic Neck- flexible , trachea midline, no stridor , thyroid  nl, carotid no bruit Chest - symmetrical excursion , unlabored           Heart/CV- RRR , no murmur , no gallop  , no rub, nl s1 s2                           - JVD- none , edema- none, stasis changes- none, varices- none           Lung- + decreased, no rhonchi, wheeze+coarse, cough-none, dullness-none, rub- none           Chest wall-  Abd-  Br/ Gen/ Rectal- Not done, not indicated Extrem- cyanosis- none, clubbing, none, atrophy- none, strength- nl Neuro- grossly intact to observation

## 2024-08-20 ENCOUNTER — Encounter: Payer: Self-pay | Admitting: Internal Medicine

## 2024-08-20 ENCOUNTER — Ambulatory Visit (INDEPENDENT_AMBULATORY_CARE_PROVIDER_SITE_OTHER): Admitting: Internal Medicine

## 2024-08-20 VITALS — BP 144/64 | HR 63 | Temp 97.5°F | Ht 66.0 in | Wt 133.0 lb

## 2024-08-20 DIAGNOSIS — G4733 Obstructive sleep apnea (adult) (pediatric): Secondary | ICD-10-CM

## 2024-08-20 DIAGNOSIS — Z87891 Personal history of nicotine dependence: Secondary | ICD-10-CM | POA: Diagnosis not present

## 2024-08-20 DIAGNOSIS — J441 Chronic obstructive pulmonary disease with (acute) exacerbation: Secondary | ICD-10-CM | POA: Diagnosis not present

## 2024-08-20 DIAGNOSIS — J9611 Chronic respiratory failure with hypoxia: Secondary | ICD-10-CM

## 2024-08-20 NOTE — Patient Instructions (Signed)
 Order- application for Ohtuvayre  nebulizer solution- use twice daily   DX COPD mixed type

## 2024-08-21 ENCOUNTER — Other Ambulatory Visit: Payer: Self-pay | Admitting: Family Medicine

## 2024-08-21 DIAGNOSIS — E039 Hypothyroidism, unspecified: Secondary | ICD-10-CM

## 2024-08-21 DIAGNOSIS — F419 Anxiety disorder, unspecified: Secondary | ICD-10-CM

## 2024-08-21 NOTE — Telephone Encounter (Signed)
 Copied from CRM [id number]. Topic: Clinical - Medication Refill >> Aug 21, 2024  9:44 AM Brittany M wrote: Medication: PARoxetine  (PAXIL ) 20 MG tablet ;  levothyroxine  (SYNTHROID ) 112 MCG tablet  Has the patient contacted their pharmacy? Yes (Agent: If no, request that the patient contact the pharmacy for the refill. If patient does not wish to contact the pharmacy document the reason why and proceed with request.) (Agent: If yes, when and what did the pharmacy advise?)  This is the patient's preferred pharmacy:   [facility] [place] [address] [place] [address] Phone: [phone] Fax: [phone]    Is this the correct pharmacy for this prescription? Yes If no, delete pharmacy and type the correct one.   Has the prescription been filled recently? Yes  Is the patient out of the medication? Yes  Has the patient been seen for an appointment in the last year OR does the patient have an upcoming appointment? Yes  Can we respond through MyChart? Yes  Agent: Please be advised that Rx refills may take up to 3 business days. We ask that you follow-up with your pharmacy.

## 2024-08-24 ENCOUNTER — Encounter: Payer: Self-pay | Admitting: Internal Medicine

## 2024-08-24 MED ORDER — PAROXETINE HCL 20 MG PO TABS: 20.0000 mg | ORAL_TABLET | Freq: Every day | ORAL | 2 refills | Status: AC

## 2024-08-24 MED ORDER — LEVOTHYROXINE SODIUM 112 MCG PO TABS: ORAL_TABLET | ORAL | 11 refills | Status: AC

## 2024-08-25 ENCOUNTER — Ambulatory Visit (INDEPENDENT_AMBULATORY_CARE_PROVIDER_SITE_OTHER)

## 2024-08-25 ENCOUNTER — Telehealth: Payer: Self-pay

## 2024-08-25 DIAGNOSIS — E538 Deficiency of other specified B group vitamins: Secondary | ICD-10-CM

## 2024-08-25 MED ORDER — CYANOCOBALAMIN 1000 MCG/ML IJ SOLN
1000.0000 ug | Freq: Once | INTRAMUSCULAR | Status: AC
  Administered 2024-08-25: 1000 ug via INTRAMUSCULAR

## 2024-08-25 NOTE — Telephone Encounter (Signed)
 Received Ohtuvayre  new start paperwork. Completed form and faxed with clinicals and insurance card copy to [facility]   Phone#: [phone] Fax#: [phone]

## 2024-08-25 NOTE — Progress Notes (Signed)
 Patient is in office today for a nurse visit for B12 Injection. Patient Injection was given in the  Left deltoid. Patient tolerated injection well.

## 2024-08-26 NOTE — Telephone Encounter (Signed)
 Received fax from [employer] confirming receipt of enrollment form.   Patient ID: [id number]

## 2024-08-28 NOTE — Telephone Encounter (Signed)
 Received fax from [employer] with summary of benefits. Referral form for Ohtuvayre  received. Rx will be triaged to [facility].. Once benefits investigation completed, pharmacy will reach out the patient to schedule shipment. If medication is unaffordable, patient will need to express financial hardship to be referred back to [facility] for patient assistance program pre-screening.   Patient ID: [id number] Pharmacy phone: [phone] [facility] Phone#: [phone]

## 2024-08-28 NOTE — Telephone Encounter (Signed)
 [facility] confirmed receipt of Rx.   Phone # [phone]

## 2024-09-03 ENCOUNTER — Other Ambulatory Visit: Payer: Self-pay | Admitting: Family Medicine

## 2024-09-03 ENCOUNTER — Other Ambulatory Visit: Payer: Self-pay | Admitting: Urology

## 2024-09-03 DIAGNOSIS — G4700 Insomnia, unspecified: Secondary | ICD-10-CM

## 2024-09-03 DIAGNOSIS — F419 Anxiety disorder, unspecified: Secondary | ICD-10-CM

## 2024-09-03 NOTE — Telephone Encounter (Signed)
 Copied from CRM #[id number]. Topic: Clinical - Medication Refill >> Sep 03, 2024  3:03 PM Viola F wrote: Medication: LORazepam  (ATIVAN ) 2 MG tablet [[id number]]  Has the patient contacted their pharmacy? Yes (Agent: If no, request that the patient contact the pharmacy for the refill. If patient does not wish to contact the pharmacy document the reason why and proceed with request.) (Agent: If yes, when and what did the pharmacy advise?)  This is the patient's preferred pharmacy:   [id number] [place], [place] - [address] [place] [address] Phone: [phone] Fax: [phone]  Is this the correct pharmacy for this prescription? Yes If no, delete pharmacy and type the correct one.   Has the prescription been filled recently? Yes  Is the patient out of the medication? Yes  Has the patient been seen for an appointment in the last year OR does the patient have an upcoming appointment? Yes  Can we respond through MyChart? No  Agent: Please be advised that Rx refills may take up to 3 business days. We ask that you follow-up with your pharmacy.

## 2024-09-04 MED ORDER — LORAZEPAM 2 MG PO TABS: 2.0000 mg | ORAL_TABLET | Freq: Every day | ORAL | 3 refills | Status: AC

## 2024-09-09 ENCOUNTER — Encounter: Payer: Self-pay | Admitting: Vascular Surgery

## 2024-09-09 NOTE — Progress Notes (Addendum)
 COVID Vaccine received:  [x]  No []  Yes Date of any COVID positive Test in last 90 days: no PCP - Dr. Betty Jordan Cardiologist -  Vascular Surgeon- Dr. Sheree   Chest x-ray - 06/18/24 Epic EKG -  02/16/24 Epic Stress Test -  ECHO -08/02/21 Epic Cardiac Cath - 05/25/20 Epic  Bowel Prep - [x]  No  []   Yes ______  Pacemaker / ICD device [x]  No []  Yes   Spinal Cord Stimulator:[x]  No []  Yes       History of Sleep Apnea? []  No [x]  Yes   CPAP used?- [x]  No []  Yes    Does the patient monitor blood sugar?          [x]  No []  Yes  []  N/A  Patient has: [x]  NO Hx DM   []  Pre-DM                 []  DM1  []   DM2 Does patient have a [employer] or Dexacom? []  No []  Yes   Fasting Blood Sugar Ranges-  Checks Blood Sugar _____ times a day  GLP1 agonist / usual dose - no GLP1 instructions:  SGLT-2 inhibitors / usual dose - no SGLT-2 instructions:   Blood Thinner / Instructions:Plavix  once a day- Last dose 09/12/24. 8 AM Will hold for surgery. Aspirin  Instructions:ASA 81 mg last dose 09/12/24   Comments:   Activity level: Patient is able  to climb a flight of stairs without difficulty; [x]  No CP  []  No SOB,   Patient can perform ADLs without assistance.   Anesthesia review: HTN, COPD, PAD, CAD, OSA- noncompliant, CKD  Patient denies shortness of breath, fever, cough and chest pain at PAT appointment.  Patient verbalized understanding and agreement to the Pre-Surgical Instructions that were given to them at this PAT appointment. Patient was also educated of the need to review these PAT instructions again prior to his/her surgery.I reviewed the appropriate phone numbers to call if they have any and questions or concerns.

## 2024-09-09 NOTE — Patient Instructions (Signed)
 SURGICAL WAITING ROOM VISITATION  Patients having surgery or a procedure may have no more than 2 support people in the waiting area - these visitors may rotate.    Children under the age of 74 must have an adult with them who is not the patient.  Visitors with respiratory illnesses are discouraged from visiting and should remain at home.  If the patient needs to stay at the hospital during part of their recovery, the visitor guidelines for inpatient rooms apply. Pre-op nurse will coordinate an appropriate time for 1 support person to accompany patient in pre-op.  This support person may not rotate.    Please refer to the [facility] website for the visitor guidelines for Inpatients (after your surgery is over and you are in a regular room).       Your procedure is scheduled on: 09/15/24   Report to [facility] Main Entrance    Report to admitting at 11:20 AM   Call this number if you have problems the morning of surgery [phone]   Do not eat food or drink liquids:After Midnight.but may have sips of water to take meds.     Oral Hygiene is also important to reduce your risk of infection.                                    Remember - BRUSH YOUR TEETH THE MORNING OF SURGERY WITH YOUR REGULAR TOOTHPASTE  DENTURES WILL BE REMOVED PRIOR TO SURGERY PLEASE DO NOT APPLY Poly grip OR ADHESIVES!!!   Do NOT smoke after Midnight   Stop all vitamins and herbal supplements 7 days before surgery.   Take these medicines the morning of surgery with A SIP OF WATER: Atorvastatin , ezetimibe , nasal spray, inhaler, tylenol , levothyroxine , montelukast , pantoprazole , paroxetine , tamsulosin .               Do not take Lasix  the morning of surgery.  Bring CPAP mask and tubing day of surgery.                              You may not have any metal on your body including hair pins, jewelry, and body piercing             Do not wear make-up, lotions, powders, perfumes/cologne, or  deodorant  Do not wear nail polish including gel and S&S, artificial/acrylic nails, or any other type of covering on natural nails including finger and toenails. If you have artificial nails, gel coating, etc. that needs to be removed by a nail salon please have this removed prior to surgery or surgery may need to be canceled/ delayed if the surgeon/ anesthesia feels like they are unable to be safely monitored.   Do not shave  48 hours prior to surgery.               Do not bring valuables to the hospital. [facility] IS NOT             RESPONSIBLE   FOR VALUABLES.   Contacts, glasses, dentures or bridgework may not be worn into surgery.   DO NOT BRING YOUR HOME MEDICATIONS TO THE HOSPITAL. PHARMACY WILL DISPENSE MEDICATIONS LISTED ON YOUR MEDICATION LIST TO YOU DURING YOUR ADMISSION IN THE HOSPITAL!    Patients discharged on the day of surgery will not be allowed to drive home.  Someone  NEEDS to stay with you for the first 24 hours after anesthesia.   Special Instructions: Bring a copy of your healthcare power of attorney and living will documents the day of surgery if you haven't scanned them before.              Please read over the following fact sheets you were given: IF YOU HAVE QUESTIONS ABOUT YOUR PRE-OP INSTRUCTIONS PLEASE CALL [phone] [facility]   If you received a COVID test during your pre-op visit  it is requested that you wear a mask when out in public, stay away from anyone that may not be feeling well and notify your surgeon if you develop symptoms. If you test positive for Covid or have been in contact with anyone that has tested positive in the last 10 days please notify you surgeon.    [facility] - Preparing for Surgery Before surgery, you can play an important role.  Because skin is not sterile, your skin needs to be as free of germs as possible.  You can reduce the number of germs on your skin by washing with CHG (chlorahexidine gluconate) soap before surgery.  CHG is  an antiseptic cleaner which kills germs and bonds with the skin to continue killing germs even after washing. Please DO NOT use if you have an allergy to CHG or antibacterial soaps.  If your skin becomes reddened/irritated stop using the CHG and inform your nurse when you arrive at Short Stay. Do not shave (including legs and underarms) for at least 48 hours prior to the first CHG shower.  You may shave your face/neck.  Please follow these instructions carefully:  1.  Shower with CHG Soap the night before surgery and the morning of surgery.  2.  If you choose to wash your hair, wash your hair first as usual with your normal  shampoo.  3.  After you shampoo, rinse your hair and body thoroughly to remove the shampoo.                             4.  Use CHG as you would any other liquid soap.  You can apply chg directly to the skin and wash.  Gently with a scrungie or clean washcloth.  5.  Apply the CHG Soap to your body ONLY FROM THE NECK DOWN.   Do   not use on face/ open                           Wound or open sores. Avoid contact with eyes, ears mouth and   genitals (private parts).                       Wash face,  Genitals (private parts) with your normal soap.             6.  Wash thoroughly, paying special attention to the area where your    surgery  will be performed.  7.  Thoroughly rinse your body with warm water from the neck down.  8.  DO NOT shower/wash with your normal soap after using and rinsing off the CHG Soap.                9.  Pat yourself dry with a clean towel.            10.  Wear clean pajamas.  11.  Place clean sheets on your bed the night of your first shower and do not  sleep with pets. Day of Surgery : Do not apply any CHG, lotions/deodorants the morning of surgery.  Please wear clean clothes to the hospital/surgery center.  FAILURE TO FOLLOW THESE INSTRUCTIONS MAY RESULT IN THE CANCELLATION OF YOUR SURGERY  PATIENT  SIGNATURE_________________________________  NURSE SIGNATURE__________________________________  ________________________________________________________________________

## 2024-09-14 ENCOUNTER — Encounter (HOSPITAL_COMMUNITY): Payer: Self-pay

## 2024-09-14 ENCOUNTER — Other Ambulatory Visit: Payer: Self-pay

## 2024-09-14 ENCOUNTER — Encounter (HOSPITAL_COMMUNITY)
Admission: RE | Admit: 2024-09-14 | Discharge: 2024-09-14 | Disposition: A | Discharge status: Home / Self Care | Source: Ambulatory Visit | Attending: Urology | Admitting: Urology

## 2024-09-14 VITALS — BP 149/87 | HR 75 | Temp 97.6°F | Resp 20 | Ht 65.0 in | Wt 134.0 lb

## 2024-09-14 DIAGNOSIS — J4489 Other specified chronic obstructive pulmonary disease: Secondary | ICD-10-CM | POA: Insufficient documentation

## 2024-09-14 DIAGNOSIS — I10 Essential (primary) hypertension: Secondary | ICD-10-CM

## 2024-09-14 DIAGNOSIS — Z86718 Personal history of other venous thrombosis and embolism: Secondary | ICD-10-CM | POA: Insufficient documentation

## 2024-09-14 DIAGNOSIS — Z853 Personal history of malignant neoplasm of breast: Secondary | ICD-10-CM | POA: Insufficient documentation

## 2024-09-14 DIAGNOSIS — I739 Peripheral vascular disease, unspecified: Secondary | ICD-10-CM | POA: Diagnosis not present

## 2024-09-14 DIAGNOSIS — I5030 Unspecified diastolic (congestive) heart failure: Secondary | ICD-10-CM | POA: Insufficient documentation

## 2024-09-14 DIAGNOSIS — Z87891 Personal history of nicotine dependence: Secondary | ICD-10-CM | POA: Insufficient documentation

## 2024-09-14 DIAGNOSIS — Z01818 Encounter for other preprocedural examination: Secondary | ICD-10-CM

## 2024-09-14 DIAGNOSIS — Z01812 Encounter for preprocedural laboratory examination: Secondary | ICD-10-CM | POA: Insufficient documentation

## 2024-09-14 DIAGNOSIS — N189 Chronic kidney disease, unspecified: Secondary | ICD-10-CM | POA: Insufficient documentation

## 2024-09-14 DIAGNOSIS — Z803 Family history of malignant neoplasm of breast: Secondary | ICD-10-CM | POA: Insufficient documentation

## 2024-09-14 DIAGNOSIS — C679 Malignant neoplasm of bladder, unspecified: Secondary | ICD-10-CM | POA: Diagnosis not present

## 2024-09-14 DIAGNOSIS — I2510 Atherosclerotic heart disease of native coronary artery without angina pectoris: Secondary | ICD-10-CM | POA: Diagnosis not present

## 2024-09-14 DIAGNOSIS — I130 Hypertensive heart and chronic kidney disease with heart failure and stage 1 through stage 4 chronic kidney disease, or unspecified chronic kidney disease: Secondary | ICD-10-CM | POA: Diagnosis not present

## 2024-09-14 DIAGNOSIS — G4733 Obstructive sleep apnea (adult) (pediatric): Secondary | ICD-10-CM | POA: Diagnosis not present

## 2024-09-14 HISTORY — DX: Dyspnea, unspecified: R06.00

## 2024-09-14 HISTORY — DX: Anemia, unspecified: D64.9

## 2024-09-14 HISTORY — DX: Unspecified asthma, uncomplicated: J45.909

## 2024-09-14 LAB — CBC
HCT: 46.5 % — ABNORMAL HIGH (ref 36.0–46.0)
Hemoglobin: 15 g/dL (ref 12.0–15.0)
MCH: 29.8 pg (ref 26.0–34.0)
MCHC: 32.3 g/dL (ref 30.0–36.0)
MCV: 92.3 fL (ref 80.0–100.0)
Platelets: 399 K/uL (ref 150–400)
RBC: 5.04 MIL/uL (ref 3.87–5.11)
RDW: 13.6 % (ref 11.5–15.5)
WBC: 7.7 K/uL (ref 4.0–10.5)
nRBC: 0 % (ref 0.0–0.2)

## 2024-09-14 LAB — BASIC METABOLIC PANEL WITH GFR
Anion gap: 10 (ref 5–15)
BUN: 25 mg/dL — ABNORMAL HIGH (ref 8–23)
CO2: 29 mmol/L (ref 22–32)
Calcium: 9.4 mg/dL (ref 8.9–10.3)
Chloride: 100 mmol/L (ref 98–111)
Creatinine, Ser: 0.85 mg/dL (ref 0.44–1.00)
GFR, Estimated: >60 mL/min (ref >60)
Glucose, Bld: 100 mg/dL — ABNORMAL HIGH (ref 70–99)
Potassium: 3.3 mmol/L — ABNORMAL LOW (ref 3.5–5.1)
Sodium: 139 mmol/L (ref 135–145)

## 2024-09-14 NOTE — Progress Notes (Incomplete)
 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  Follows with cardiology for hx of nonobstructive CAD, hypertension, HFpEF. Was hospitalized in 05/2020 with worsening DOE.  Echo showed reduced EF at 45-50%. Seen by cardiology and underwent coronary angiography which was negative for any obstructive disease. She was started on Paxil  given significant depression. 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 Sheree. Last echo 07/2021 with improved LVEF to 55-60% and grade 1 DD. Last seen by PA Bhagat 08/09/22, at that time, no changes made to management, 70-month follow-up recommended but has been lost to f/u  Seen by Dr. Sheree with Vascular on 07/03/23. 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 Patient is on Plavix  and Pletal  due to subclavian stent 06/2020.  She also has history of chronic nonocclusive DVT of RLE. Per Dr. Sheree:  I discussed with the patient that she could undergo angiography with possible stenting of the IMA if she really could not gain weight or was having severe abdominal pain or food fear otherwise she would require large abdominal operation with bypass to her celiac and mesenteric arteries.  I will have her follow-up in 6 months to evaluate her weight and also evaluate the stent in her left subclavian artery which is patent by physical exam today but has not been  evaluated recently although she remains asymptomatic from a subclavian standpoint.  If she does have worsening abdominal pain or weight loss we would need to obtain CT angio to evaluate our options with possible endovascular stenting of her IMA versus bypass to her celiac and/or SMA.  Patient follows with Pulmonology for COPD with chronic respiratory failure and OSA on CPAP. Last seen in clinic on 08/20/24 and treated for COPD exacerbation.  VS: BP (!) 149/87   Pulse 75   Temp 36.4 C (Oral)   Resp 20   Ht 5' 5 (1.651 m)   Wt 60.8 kg   SpO2 93%   BMI 22.30 kg/m   PROVIDERS: Jordan, [name], MD   LABS: {[facility] AN LABS REVIEWED:112001::Labs reviewed: Acceptable for surgery.} (all labs ordered are listed, but only abnormal results are displayed)  Labs Reviewed  BASIC METABOLIC PANEL WITH GFR  CBC     CXR 06/18/24:  FINDINGS:   LUNGS AND PLEURA: Mild scarring in the upper right lung. No focal airspace consolidation or pulmonary edema. No pleural effusion or pneumothorax.   HEART AND MEDIASTINUM: Normal cardiomediastinal contours.   BONES AND SOFT TISSUES: Surgical clips in the right breast. No acute osseous abnormality.   IMPRESSION: 1. No acute process. 2. Mild scarring in the upper right lung.   EKG:   TTE 08/02/2021:  1. Left ventricular ejection fraction, by estimation, is 55 to 60%. The  left ventricle has normal function. The left ventricle has no regional  wall motion abnormalities. Left ventricular diastolic parameters are  consistent with Grade II diastolic  dysfunction (pseudonormalization).   2. Right ventricular systolic function is normal. The right ventricular  size is normal. Tricuspid regurgitation signal is inadequate for assessing  PA pressure.   3. The mitral valve is normal in structure. No evidence of mitral valve  regurgitation. No evidence of mitral stenosis.   4. The aortic valve is normal in structure. Aortic valve regurgitation is  not  visualized. No aortic stenosis is present.   5. The inferior vena cava is dilated in size with >50% respiratory  variability, suggesting right atrial pressure of 8 mmHg.   Comparison(s): Compared to prior echo in 05/21/2020, there has been  improvement in the Left ventricular ejection fraction was 45-50% now  55-60%.    Cath 05/25/2020: No significant coronary obstructive disease.  The left main is short and immediately trifurcates into a large LAD, ramus immediate and left circumflex vessel.  The midportion of the LAD dips intramyocardially slightly but there is no evidence for systolic bridging.  The RCA is a dominant vessel that has calcification without obstructive stenosis.   Hyperdynamic LV function with EF estimated at 65%.  LVEDP 10 mmHg.   RECOMMENDATION: Medical therapy.  Past Medical History:  Diagnosis Date   Anemia    Asthma    Bilateral leg cramps    Bladder neoplasm    Cancer (HCC)    CHF (congestive heart failure) (HCC)    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)    05/ 2018  chronic non-occlusive DVT RLE   Chronic kidney disease    COPD, frequent exacerbations (HCC)    03-06-2018 per pt last exacerbation 12/ 2018   Coronary artery disease    cardiologist-  dr rolan-- 03-11-2018 er cath , 80% stenosis in the ostial second diagonal   Dyspnea    Dyspnea on minimal exertion    Emphysema/COPD    CAT score- 17   Family history of breast cancer    Family history of colon cancer    Family history of melanoma    Family history of ovarian cancer    Family history of pancreatic cancer    Fibromyalgia 1995   GERD (gastroesophageal reflux disease)    Hiatal hernia    History of breast cancer    History of diverticulitis of colon    2002  s/p  sigmoid colectomy   History of multiple pulmonary nodules    hx RUL nodules x2  s/p right VATS w/ wedge resection 09-11-2005 and 06-14-2011  both  necrotizing granulomatous inflammation w/ cystic area of  necrosis & focal calcification   History of right breast cancer oncologist-  dr layla-- no recurrence   dx 2010--  IDC, Stage IA , ER/PR+,  (pT1c pN0) 11-09-2008 right lumpectomy;  12-18-2008 right simple mastectomy for DCIS margins;  completed chemotherapy 2010 (no radiation) and completed antiestrogen therapy   Hx of colonic polyps    Dr. 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   Nocturia    OA (osteoarthritis)    thumbs   On supplemental oxygen  therapy    03-06-2018 per pt uses only at night,  checks O2 sats at home,  stated am sat 89% after moving around average 93-94% with RA   Osteoporosis    Peripheral arterial occlusive disease vascular-- dr chen/ dr sheree   proximal right SFA severe focal stenosis with collaterals from  the PFA/  04/ 2012 occluded celiac and SMA arteries with distal reconstitution w/ patent IMA   Peripheral vascular disease    chronic DVT RLE,  mesenteric vascular disease   Pneumonia    Right lower lobe pulmonary nodule    Chest CT 08-29-2017   Sleep apnea    Stage 3 severe COPD by GOLD classification (HCC) hx frequent exacerbations--   pulmologist-  dr rick--  per lov note , dated 12-20-2017, oxyogen 2L is prescribed for use with exertion (but pt only uses mostly at night), O2 sats on RA run the 80s , this day sat 86% RA and with 2L O2 sat 92%   Varicose veins of leg with swelling    varicose vein surgery - Dr. Catherine   Wears glasses    Wears hearing aid in both ears     Past Surgical History:  Procedure Laterality Date   ABDOMINAL AORTOGRAM N/A 07/11/2020   Procedure: ABDOMINAL AORTOGRAM;  Surgeon: Sheree Penne Bruckner, MD;  Location[facility] INVASIVE CV LAB;  Service: Cardiovascular;  Laterality: N/A;   AORTIC ARCH ANGIOGRAPHY N/A 07/11/2020   Procedure: AORTIC ARCH ANGIOGRAPHY;  Surgeon: Sheree Penne Bruckner, MD;  Location: [facility] INVASIVE  CV LAB;  Service: Cardiovascular;  Laterality: N/A;   BREAST EXCISIONAL BIOPSY Left 10/15/2008   BREAST LUMPECTOMY W/ NEEDLE LOCALIZATION Right 11-09-2008    dr cornett   [facility]   CARDIAC CATHETERIZATION  03-12-2011   dr rolan   70-80% ostial stenosis in small second diagonal (appears to small for intervention),  dLAD 40-50%,  minimal luminal irregulartieis involoving LCFx and RCA,  LVEF 55%   CATARACT EXTRACTION W/ INTRAOCULAR LENS  IMPLANT, BILATERAL  2011   CYSTOSCOPY N/A 03/11/2018   Procedure: CYSTOSCOPY WITH INSTILLATION OF POST OPERATIVE EPIRUBICIN ;  Surgeon: Nieves Cough, MD;  Location: [facility];  Service: Urology;  Laterality: N/A;   LEFT HEART CATH AND CORONARY ANGIOGRAPHY N/A 05/25/2020   Procedure: LEFT HEART CATH AND CORONARY ANGIOGRAPHY;  Surgeon: Burnard Debby LABOR, MD;  Location: [facility] INVASIVE CV LAB;  Service: Cardiovascular;  Laterality: N/A;   MASTECTOMY Right    PARTIAL COLECTOMY  2002   sigmoid and appectomy (diverticulitis)   PARTIAL MASTECTOMY WITH NEEDLE LOCALIZATION Left 02/23/2013   Procedure:  LEFT PARTIAL MASTECTOMY WITH NEEDLE LOCALIZATION;  Surgeon: Elon CHRISTELLA Pacini, MD;  Location: [facility];  Service: General;  Laterality: Left;   PERIPHERAL VASCULAR INTERVENTION  07/11/2020   Procedure: PERIPHERAL VASCULAR INTERVENTION;  Surgeon: Sheree Penne Bruckner, MD;  Location: [facility] INVASIVE CV LAB;  Service: Cardiovascular;;   RECONSTRUCTION BREAST W/ LATISSIMUS DORSI FLAP Right 05-30-2009   dr leora  [facility]   REDUCTION MAMMAPLASTY Left    SIMPLE MASTECTOMY Right 12-28-2008    dr vanderbilt  [facility]   TRANSFORAMINAL LUMBAR INTERBODY FUSION W/ MIS 1 LEVEL Left 10/22/2022   Procedure: LUMBAR THREE-FOUR MINIMALLY INVASIVE TRANSFORAMINAL LUMBAR INTERBODY FUSION;  Surgeon: Cheryle Debby LABOR, MD;  Location: [facility];  Service: Neurosurgery;  Laterality: Left;  3C/RM 21 to follow   TRANSTHORACIC ECHOCARDIOGRAM  07-09-2016  dr rolan   ef 55-60%, grade 1 diastoic dysfunction/   mild TR   TRANSURETHRAL RESECTION OF BLADDER TUMOR N/A 03/11/2018   Procedure: TRANSURETHRAL RESECTION OF BLADDER TUMOR (TURBT) 2-5cm;  Surgeon: Nieves Cough, MD;  Location: [facility];  Service: Urology;  Laterality: N/A;   UPPER EXTREMITY ANGIOGRAPHY Left 07/11/2020   Procedure: Upper Extremity Angiography;  Surgeon: Sheree Penne Bruckner, MD;  Location: [facility] INVASIVE CV LAB;  Service: Cardiovascular;  Laterality: Left;   VAGINAL HYSTERECTOMY  1973   VIDEO ASSISTED THORACOSCOPY (VATS)/WEDGE RESECTION Right 09-11-2005  &  06-14-2011   dr hendrickso  [facility]   both RUL    MEDICATIONS:  acetaminophen  (ACETAMINOPHEN  8 HOUR) 650 MG CR tablet   albuterol  (VENTOLIN  HFA) 108 (90 Base) MCG/ACT inhaler   ammonium lactate (AMLACTIN DAILY) 12 % lotion   aspirin  EC 81 MG tablet   atorvastatin  (LIPITOR ) 80 MG tablet   budesonide  (PULMICORT ) 0.5 MG/2ML nebulizer solution   Carboxymethylcellulose Sodium (THERATEARS) 0.25 % SOLN   cilostazol  (PLETAL ) 100 MG tablet   clobetasol (TEMOVATE) 0.05 % external solution   clopidogrel  (PLAVIX ) 75 MG tablet   DUPIXENT  300 MG/2ML SOPN   ezetimibe  (ZETIA ) 10 MG tablet   ferrous sulfate  (FEROSUL) 325 (65 FE) MG tablet   fluticasone  (FLONASE ) 50 MCG/ACT nasal spray   furosemide  (LASIX ) 40 MG tablet   hydrOXYzine  (ATARAX ) 25 MG tablet   ipratropium-albuterol  (DUONEB) 0.5-2.5 (3) MG/3ML SOLN   levothyroxine  (SYNTHROID ) 112 MCG tablet   LORazepam  (ATIVAN ) 2 MG tablet   montelukast  (SINGULAIR ) 10 MG tablet   OHTUVAYRE  3 MG/2.5ML SUSP   pantoprazole  (PROTONIX ) 40 MG tablet   PARoxetine  (PAXIL ) 20 MG tablet   potassium chloride  SA (KLOR-CON  M) 20 MEQ tablet   predniSONE  (DELTASONE ) 10 MG tablet   tamsulosin  (FLOMAX ) 0.4 MG CAPS capsule   traZODone  (DESYREL ) 100 MG tablet   TRELEGY ELLIPTA  200-62.5-25 MCG/ACT AEPB   No current facility-administered medications for this encounter.   Burnard CHRISTELLA Odis DEVONNA [facility] Short Stay/Anesthesiology [facility] Phone [phone] 09/14/2024 3:05 PM

## 2024-09-15 ENCOUNTER — Encounter (HOSPITAL_COMMUNITY): Payer: Self-pay | Admitting: Urology

## 2024-09-15 ENCOUNTER — Ambulatory Visit (HOSPITAL_COMMUNITY)
Admission: RE | Admit: 2024-09-15 | Discharge: 2024-09-15 | Disposition: A | Discharge status: Home / Self Care | Attending: Urology | Admitting: Urology

## 2024-09-15 ENCOUNTER — Encounter (HOSPITAL_COMMUNITY): Admitting: Medical

## 2024-09-15 ENCOUNTER — Ambulatory Visit (HOSPITAL_COMMUNITY): Admitting: Anesthesiology

## 2024-09-15 ENCOUNTER — Encounter (HOSPITAL_COMMUNITY)
Admission: RE | Disposition: A | Discharge status: Home / Self Care | Payer: Self-pay | Source: Home / Self Care | Attending: Urology

## 2024-09-15 DIAGNOSIS — I2510 Atherosclerotic heart disease of native coronary artery without angina pectoris: Secondary | ICD-10-CM

## 2024-09-15 DIAGNOSIS — I110 Hypertensive heart disease with heart failure: Secondary | ICD-10-CM | POA: Diagnosis not present

## 2024-09-15 DIAGNOSIS — D414 Neoplasm of uncertain behavior of bladder: Secondary | ICD-10-CM | POA: Diagnosis present

## 2024-09-15 DIAGNOSIS — I5021 Acute systolic (congestive) heart failure: Secondary | ICD-10-CM

## 2024-09-15 HISTORY — PX: CYSTOSCOPY WITH FULGERATION: SHX6638

## 2024-09-15 SURGERY — CYSTOSCOPY, WITH BLADDER FULGURATION
Anesthesia: General | Site: Bladder

## 2024-09-15 MED ORDER — STERILE WATER FOR IRRIGATION IR SOLN
Status: DC | PRN
  Administered 2024-09-15: 3000 mL

## 2024-09-15 MED ORDER — PROPOFOL 10 MG/ML IV BOLUS
INTRAVENOUS | Status: DC | PRN
  Administered 2024-09-15: 200 mg via INTRAVENOUS

## 2024-09-15 MED ORDER — 0.9 % SODIUM CHLORIDE (POUR BTL) OPTIME
TOPICAL | Status: DC | PRN
  Administered 2024-09-15: 1000 mL

## 2024-09-15 MED ORDER — AMISULPRIDE (ANTIEMETIC) 5 MG/2ML IV SOLN: 10.0000 mg | Freq: Once | INTRAVENOUS | Status: DC | PRN

## 2024-09-15 MED ORDER — ONDANSETRON HCL 4 MG/2ML IJ SOLN
INTRAMUSCULAR | Status: DC | PRN
  Administered 2024-09-15: 4 mg via INTRAVENOUS

## 2024-09-15 MED ORDER — CEFAZOLIN SODIUM-DEXTROSE 2-4 GM/100ML-% IV SOLN
2.0000 g | INTRAVENOUS | Status: AC
  Administered 2024-09-15: 2 g via INTRAVENOUS
  Filled 2024-09-15: qty 100

## 2024-09-15 MED ORDER — LACTATED RINGERS IV SOLN: INTRAVENOUS | Status: DC

## 2024-09-15 MED ORDER — DEXAMETHASONE SOD PHOSPHATE PF 10 MG/ML IJ SOLN
INTRAMUSCULAR | Status: DC | PRN
  Administered 2024-09-15: 10 mg via INTRAVENOUS

## 2024-09-15 MED ORDER — CHLORHEXIDINE GLUCONATE 0.12 % MT SOLN
15.0000 mL | Freq: Once | OROMUCOSAL | Status: AC
  Administered 2024-09-15: 15 mL via OROMUCOSAL

## 2024-09-15 MED ORDER — ORAL CARE MOUTH RINSE: 15.0000 mL | Freq: Once | OROMUCOSAL | Status: AC

## 2024-09-15 MED ORDER — EPHEDRINE SULFATE-NACL 50-0.9 MG/10ML-% IV SOSY
PREFILLED_SYRINGE | INTRAVENOUS | Status: DC | PRN
  Administered 2024-09-15: 10 mg via INTRAVENOUS

## 2024-09-15 MED ORDER — ACETAMINOPHEN 500 MG PO TABS: 1000.0000 mg | ORAL_TABLET | Freq: Once | ORAL | Status: DC

## 2024-09-15 MED ORDER — LIDOCAINE 2% (20 MG/ML) 5 ML SYRINGE
INTRAMUSCULAR | Status: DC | PRN
  Administered 2024-09-15: 60 mg via INTRAVENOUS

## 2024-09-15 MED ORDER — FENTANYL CITRATE (PF) 50 MCG/ML IJ SOSY: 25.0000 ug | PREFILLED_SYRINGE | INTRAMUSCULAR | Status: DC | PRN

## 2024-09-15 MED ORDER — ACETAMINOPHEN 10 MG/ML IV SOLN: 1000.0000 mg | Freq: Once | INTRAVENOUS | Status: DC | PRN

## 2024-09-15 MED ORDER — FENTANYL CITRATE (PF) 100 MCG/2ML IJ SOLN
INTRAMUSCULAR | Status: AC
  Filled 2024-09-15: qty 2

## 2024-09-15 MED ORDER — ONDANSETRON HCL 4 MG/2ML IJ SOLN: 4.0000 mg | Freq: Once | INTRAMUSCULAR | Status: DC | PRN

## 2024-09-15 MED ORDER — CLOPIDOGREL BISULFATE 75 MG PO TABS: 75.0000 mg | ORAL_TABLET | Freq: Every day | ORAL | Status: AC

## 2024-09-15 MED ORDER — IPRATROPIUM-ALBUTEROL 0.5-2.5 (3) MG/3ML IN SOLN
3.0000 mL | Freq: Once | RESPIRATORY_TRACT | Status: AC | PRN
  Administered 2024-09-15: 3 mL via RESPIRATORY_TRACT

## 2024-09-15 MED ORDER — IPRATROPIUM-ALBUTEROL 0.5-2.5 (3) MG/3ML IN SOLN
RESPIRATORY_TRACT | Status: AC
  Filled 2024-09-15: qty 3

## 2024-09-15 MED ORDER — FENTANYL CITRATE (PF) 100 MCG/2ML IJ SOLN
INTRAMUSCULAR | Status: DC | PRN
  Administered 2024-09-15: 25 ug via INTRAVENOUS
  Administered 2024-09-15: 50 ug via INTRAVENOUS
  Administered 2024-09-15: 25 ug via INTRAVENOUS

## 2024-09-15 SURGICAL SUPPLY — 11 items
BAG URINE DRAIN 2000ML AR STRL (UROLOGICAL SUPPLIES) IMPLANT
BAG URO CATCHER STRL LF (MISCELLANEOUS) ×2 IMPLANT
DRAPE FOOT SWITCH (DRAPES) ×2 IMPLANT
GLOVE SURG LX STRL 7.5 STRW (GLOVE) ×2 IMPLANT
GOWN STRL REUS W/ TWL XL LVL3 (GOWN DISPOSABLE) ×2 IMPLANT
KIT TURNOVER KIT A (KITS) ×2 IMPLANT
LOOP CUT BIPOLAR 24F LRG (ELECTROSURGICAL) IMPLANT
MANIFOLD NEPTUNE II (INSTRUMENTS) ×2 IMPLANT
PACK CYSTO (CUSTOM PROCEDURE TRAY) ×2 IMPLANT
TUBING CONNECTING 10 (TUBING) ×2 IMPLANT
TUBING UROLOGY SET (TUBING) ×2 IMPLANT

## 2024-09-15 NOTE — Interval H&P Note (Signed)
 History and Physical Interval Note:  09/15/2024 10:47 AM  [name]  has presented today for surgery, with the diagnosis of NEOPLASM OF THE BLADDER.  The various methods of treatment have been discussed with the patient and family. After consideration of risks, benefits and other options for treatment, the patient has consented to  Procedure(s): CYSTOSCOPY, WITH BLADDER FULGURATION (N/A) with possible postoperative instillation of gemcitabine as a surgical intervention.  The patient's history has been reviewed, patient examined, no change in status, stable for surgery.  I have reviewed the patient's chart and labs.  She is well today without dysuria or gross hematuria.  No cough cold or congestion.  Discussed risk of bleeding and bladder perforation among others with prolonged Foley.  Questions were answered to the patient's satisfaction.  She elects to proceed.   Donnice Brooks

## 2024-09-15 NOTE — Op Note (Signed)
 Preoperative diagnosis: Bladder neoplasm of uncertain malignant potential Postoperative diagnosis: Same  Procedure: Cystoscopy with bladder biopsy and fulguration 0.5 to 2 cm  Surgeon: Nieves  Anesthesia: General  Indication for procedure: [name] is a 78 year old female with a history of urothelial carcinoma.  On routine office surveillance cystoscopy it appeared she had a lesion growing at the dome that might be more nodular.  She was brought today for cystoscopy under anesthesia with biopsy possible resection.  Findings: On exam the vulva appeared normal without lesion.  Moderate atrophy.  The meatus appeared normal.  On bimanual with the bladder and the pelvic area were palpably normal without mass.  On cystoscopy the urethra was unremarkable.  The trigone and ureteral orifices were in their normal orthotopic position with clear efflux.  There were no papillary lesions.  There was some fine changes to the mucosa more prominent on the right side and at the dome that as she was filled and emptied look more like fine glomerulations.  Again no papillary or nodular component.  These areas were biopsied and fulgurated.  These may be more of inflammatory changes.  Description of procedure: After consent was obtained patient brought to the operating room.  After adequate anesthesia he was placed in lithotomy position and prepped and draped in the usual sterile fashion.  Timeout was performed to confirm the patient and procedure.  The cystoscope was passed per urethra and the bladder inspected.  I was able to get a good look at the bladder and the bladder neck with a 30 degree lens.  I filled her bladder and emptied it repeatedly and took a good look.  We then decided to biopsy the right lateral and up toward the dome which  at the most concentrated areas of these findings.  The cold cup biopsy were used to biopsy the right lateral and the dome and then the biopsy sites were fulgurated with the Bugbee  electrode.  Largest area was 15 mm at the dome.  Hemostasis was excellent low-pressure.  The bladder was partially filled and the scope backed out.  Exam under anesthesia was performed.  She was awakened and taken the cover room in stable condition.  Given no papillary lesion, gemcitabine was not warranted.    Complications: None  Blood loss: Minimal  Specimens to pathology: #1 right bladder biopsy #2 Dome bladder biopsy  Drains: None  Disposition: Patient stable to PACU.  I called and discussed the procedure, postop care and follow-up with [name].

## 2024-09-15 NOTE — Transfer of Care (Signed)
 Immediate Anesthesia Transfer of Care Note  Patient: [name]  Procedure(s) Performed: CYSTOSCOPY, WITH BLADDER BIOPSY AND FULGURATION (Bladder)  Patient Location: PACU  Anesthesia Type:General  Level of Consciousness: sedated, patient cooperative, and responds to stimulation  Airway & Oxygen  Therapy: Patient Spontanous Breathing and Patient connected to face mask oxygen   Post-op Assessment: Report given to RN and Post -op Vital signs reviewed and stable  Post vital signs: Reviewed and stable  Last Vitals:  Vitals Value Taken Time  BP 153/79 09/15/24 12:21  Temp    Pulse 64 09/15/24 12:23  Resp 16 09/15/24 12:23  SpO2 100 % 09/15/24 12:23  Vitals shown include unfiled device data.  Last Pain:  Vitals:   09/15/24 0941  TempSrc: Oral  PainSc: 0-No pain         Complications: No notable events documented.

## 2024-09-15 NOTE — Anesthesia Procedure Notes (Signed)
 Procedure Name: LMA Insertion Date/Time: 09/15/2024 11:12 AM  Performed by: Cindie Charleen PARAS, CRNAPre-anesthesia Checklist: Patient identified, Emergency Drugs available, Suction available, Patient being monitored and Timeout performed Patient Re-evaluated:Patient Re-evaluated prior to induction Oxygen  Delivery Method: Circle system utilized Preoxygenation: Pre-oxygenation with 100% oxygen  Induction Type: IV induction Ventilation: Mask ventilation without difficulty LMA: LMA inserted LMA Size: 4.0 Number of attempts: 1 Placement Confirmation: positive ETCO2 and breath sounds checked- equal and bilateral Tube secured with: Tape Dental Injury: Teeth and Oropharynx as per pre-operative assessment

## 2024-09-15 NOTE — Anesthesia Postprocedure Evaluation (Signed)
 Anesthesia Post Note  Patient: [name]  Procedure(s) Performed: CYSTOSCOPY, WITH BLADDER BIOPSY AND FULGURATION (Bladder)     Patient location during evaluation: PACU Anesthesia Type: General Level of consciousness: awake Pain management: pain level controlled Vital Signs Assessment: post-procedure vital signs reviewed and stable Respiratory status: spontaneous breathing, nonlabored ventilation and respiratory function stable Cardiovascular status: blood pressure returned to baseline and stable Postop Assessment: no apparent nausea or vomiting Anesthetic complications: no   No notable events documented.  Last Vitals:  Vitals:   09/15/24 1514 09/15/24 1517  BP: 136/80   Pulse: 70   Resp: 20   Temp:  (!) 36.1 C  SpO2: 92%     Last Pain:  Vitals:   09/15/24 1517  TempSrc: Oral  PainSc:                  Rossana Molchan P Lonya Johannesen

## 2024-09-15 NOTE — Anesthesia Preprocedure Evaluation (Addendum)
 Anesthesia Evaluation  Patient identified by MRN, date of birth, ID band Patient awake    Reviewed: Allergy & Precautions, NPO status , Patient's Chart, lab work & pertinent test results  Airway Mallampati: III  TM Distance: >3 FB Neck ROM: Full    Dental no notable dental hx.    Pulmonary asthma , sleep apnea and Oxygen  sleep apnea , COPD,  COPD inhaler and oxygen  dependent, former smoker pulmonary nodules 2L O2 prn   Pulmonary exam normal        Cardiovascular hypertension, + CAD, + Peripheral Vascular Disease, +CHF and + DVT  Normal cardiovascular exam     Neuro/Psych  PSYCHIATRIC DISORDERS Anxiety      Neuromuscular disease    GI/Hepatic Neg liver ROS, hiatal hernia,GERD  Medicated and Controlled,,  Endo/Other  Hypothyroidism    Renal/GU Renal disease     Musculoskeletal  (+) Arthritis ,  Fibromyalgia -  Abdominal   Peds  Hematology  (+) Blood dyscrasia (Plavix )   Anesthesia Other Findings NEOPLASM OF THE BLADDER  Reproductive/Obstetrics                              Anesthesia Physical Anesthesia Plan  ASA: 4  Anesthesia Plan: General   Post-op Pain Management:    Induction: Intravenous  PONV Risk Score and Plan: 3 and Ondansetron , Dexamethasone  and Treatment may vary due to age or medical condition  Airway Management Planned: LMA  Additional Equipment:   Intra-op Plan:   Post-operative Plan: Extubation in OR  Informed Consent: I have reviewed the patients History and Physical, chart, labs and discussed the procedure including the risks, benefits and alternatives for the proposed anesthesia with the patient or authorized representative who has indicated his/her understanding and acceptance.     Dental advisory given  Plan Discussed with: CRNA  Anesthesia Plan Comments:          Anesthesia Quick Evaluation

## 2024-09-16 ENCOUNTER — Encounter (HOSPITAL_COMMUNITY): Payer: Self-pay | Admitting: Urology

## 2024-09-16 LAB — SURGICAL PATHOLOGY

## 2024-10-24 ENCOUNTER — Other Ambulatory Visit: Payer: Self-pay | Admitting: Family Medicine

## 2024-10-24 DIAGNOSIS — E7800 Pure hypercholesterolemia, unspecified: Secondary | ICD-10-CM

## 2024-10-24 DIAGNOSIS — I2510 Atherosclerotic heart disease of native coronary artery without angina pectoris: Secondary | ICD-10-CM

## 2024-10-24 DIAGNOSIS — I10 Essential (primary) hypertension: Secondary | ICD-10-CM

## 2024-10-27 ENCOUNTER — Other Ambulatory Visit: Payer: Self-pay | Admitting: Family Medicine

## 2024-10-29 ENCOUNTER — Other Ambulatory Visit: Payer: Self-pay | Admitting: Vascular Surgery

## 2024-11-30 ENCOUNTER — Ambulatory Visit: Admitting: Family Medicine

## 2024-12-03 ENCOUNTER — Inpatient Hospital Stay: Payer: 59 | Admitting: Adult Health

## 2024-12-16 ENCOUNTER — Encounter

## 2025-03-10 ENCOUNTER — Ambulatory Visit
